# Patient Record
Sex: Female | Born: 1965
Health system: Southern US, Community
[De-identification: ages and names within clinical notes are randomized; demographics above are authoritative.]

## PROBLEM LIST (undated history)

## (undated) DIAGNOSIS — G971 Other reaction to spinal and lumbar puncture: Secondary | ICD-10-CM

## (undated) DIAGNOSIS — K8010 Calculus of gallbladder with chronic cholecystitis without obstruction: Secondary | ICD-10-CM

## (undated) DIAGNOSIS — R519 Headache, unspecified: Secondary | ICD-10-CM

## (undated) DIAGNOSIS — N186 End stage renal disease: Secondary | ICD-10-CM

## (undated) DIAGNOSIS — Z992 Dependence on renal dialysis: Secondary | ICD-10-CM

## (undated) DIAGNOSIS — Z9289 Personal history of other medical treatment: Secondary | ICD-10-CM

## (undated) DIAGNOSIS — I10 Essential (primary) hypertension: Secondary | ICD-10-CM

## (undated) DIAGNOSIS — J302 Other seasonal allergic rhinitis: Secondary | ICD-10-CM

## (undated) DIAGNOSIS — R51 Headache: Secondary | ICD-10-CM

## (undated) DIAGNOSIS — D649 Anemia, unspecified: Secondary | ICD-10-CM

## (undated) DIAGNOSIS — E119 Type 2 diabetes mellitus without complications: Secondary | ICD-10-CM

## (undated) DIAGNOSIS — M1990 Unspecified osteoarthritis, unspecified site: Secondary | ICD-10-CM

## (undated) DIAGNOSIS — N189 Chronic kidney disease, unspecified: Secondary | ICD-10-CM

## (undated) DIAGNOSIS — J9690 Respiratory failure, unspecified, unspecified whether with hypoxia or hypercapnia: Secondary | ICD-10-CM

## (undated) DIAGNOSIS — I509 Heart failure, unspecified: Secondary | ICD-10-CM

## (undated) HISTORY — DX: Respiratory failure, unspecified, unspecified whether with hypoxia or hypercapnia: J96.90

## (undated) HISTORY — PX: JOINT REPLACEMENT: SHX530

## (undated) HISTORY — PX: CATARACT EXTRACTION W/ INTRAOCULAR LENS IMPLANT: SHX1309

## (undated) HISTORY — DX: Chronic kidney disease, unspecified: N18.9

---

## 1995-03-29 HISTORY — PX: TOTAL HIP ARTHROPLASTY: SHX124

## 2001-06-22 ENCOUNTER — Emergency Department (HOSPITAL_COMMUNITY): Admission: EM | Admit: 2001-06-22 | Discharge: 2001-06-22 | Payer: Self-pay | Admitting: *Deleted

## 2002-02-07 ENCOUNTER — Emergency Department (HOSPITAL_COMMUNITY): Admission: EM | Admit: 2002-02-07 | Discharge: 2002-02-07 | Payer: Self-pay | Admitting: Emergency Medicine

## 2002-04-14 ENCOUNTER — Emergency Department (HOSPITAL_COMMUNITY): Admission: EM | Admit: 2002-04-14 | Discharge: 2002-04-14 | Payer: Self-pay | Admitting: Internal Medicine

## 2004-01-28 ENCOUNTER — Ambulatory Visit: Payer: Self-pay | Admitting: Family Medicine

## 2004-03-25 ENCOUNTER — Ambulatory Visit: Payer: Self-pay | Admitting: Family Medicine

## 2004-04-18 ENCOUNTER — Ambulatory Visit: Admission: RE | Admit: 2004-04-18 | Discharge: 2004-04-18 | Payer: Self-pay | Admitting: Family Medicine

## 2004-04-18 ENCOUNTER — Ambulatory Visit: Payer: Self-pay | Admitting: Pulmonary Disease

## 2004-04-26 ENCOUNTER — Ambulatory Visit: Payer: Self-pay | Admitting: Family Medicine

## 2005-02-16 ENCOUNTER — Ambulatory Visit: Payer: Self-pay | Admitting: Family Medicine

## 2005-03-09 ENCOUNTER — Ambulatory Visit (HOSPITAL_COMMUNITY): Admission: RE | Admit: 2005-03-09 | Discharge: 2005-03-09 | Payer: Self-pay | Admitting: Family Medicine

## 2005-03-09 IMAGING — US US BREAST*R*
1 series · 4 of 4 positions shown · non-contrast
Comparison: none

BILATERAL DIAGNOSTIC MAMMOGRAM AND RIGHT BREAST ULTRASOUND:
CLINICAL DATA: Palpable abnormality, right breast.

[Series 1: unknown · 0.06mm/px · 4 of 4 slices shown]
[im 1/4]
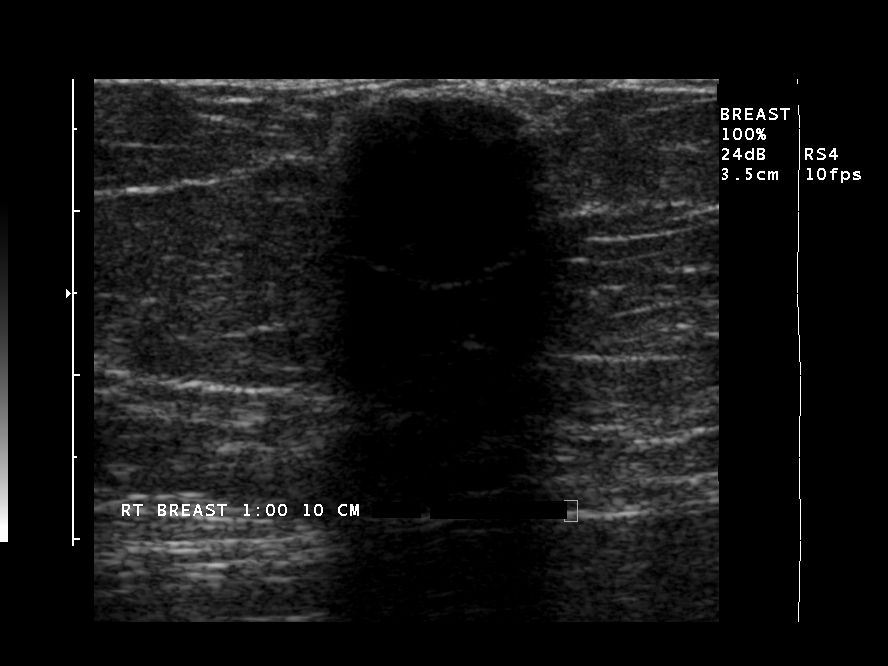
[im 2/4]
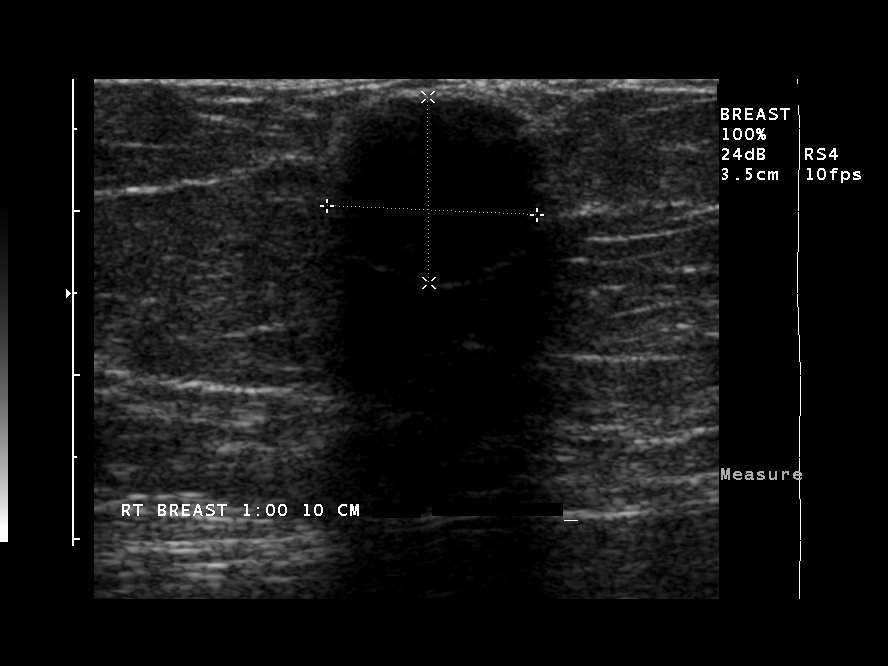
[im 3/4]
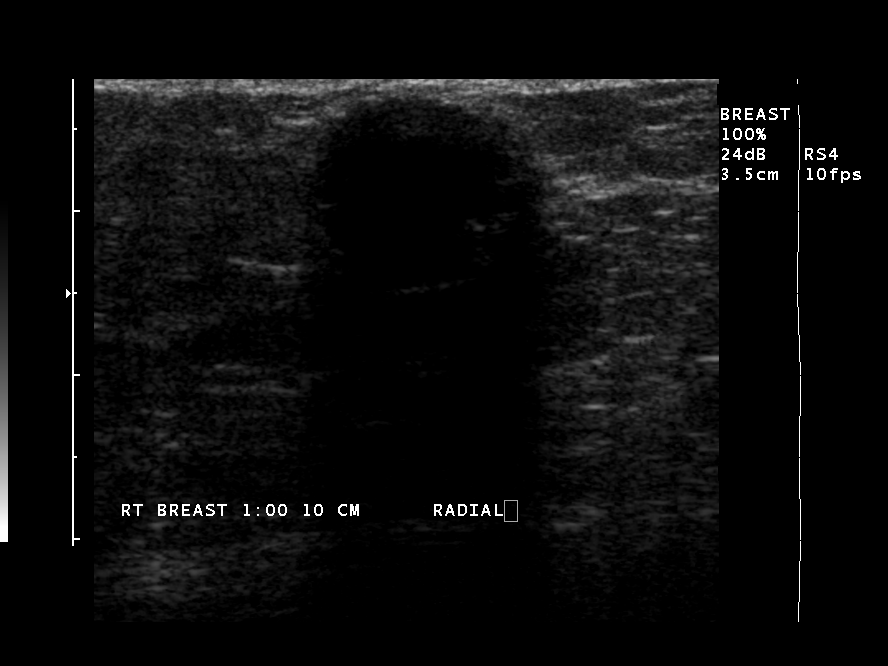
[im 4/4]
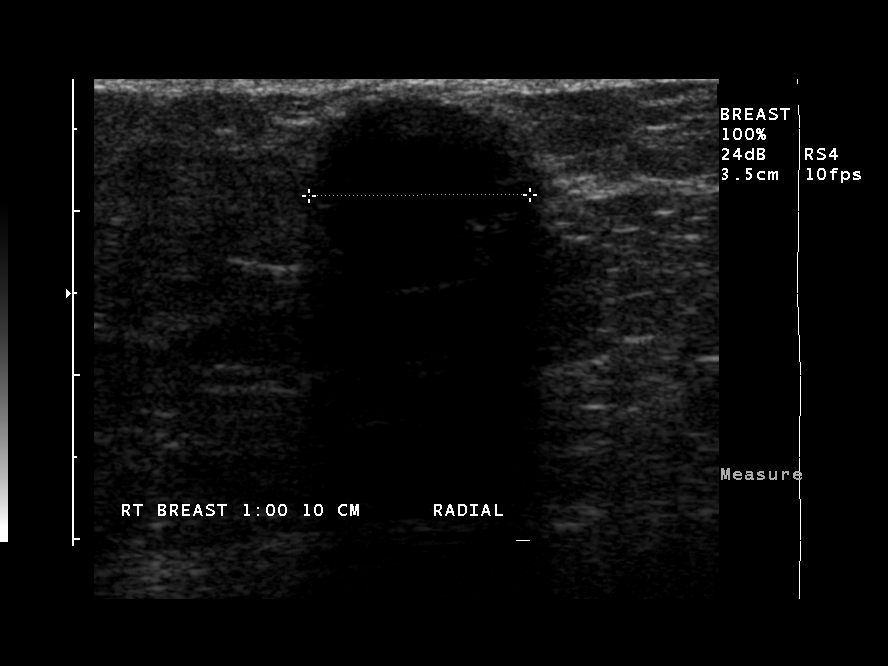

[4 of 4 positions shown; findings below may reference images not displayed]

There are scattered fibroglandular densities.  There is a calcified oil cyst in the upper inner 
quadrant of the right breast corresponding with the patient's palpable mass.  There is no 
suspicious mass or malignant type microcalcifications.

On physical exam, I palpate a discrete mass in the right breast at 1 o'clock approximately 10 cm 
from the nipple.  The patient states this is where she has had previous trauma and is unchanged for
several years.  Sonographically, calcified oil cyst is noted measuring 13 mm.
IMPRESSION: Calcified oil cyst, right breast.  No evidence of malignancy in either breast.  Screening mammogram
can be deferred until the age of 42 if the clinical exam remains benign/stable.

ASSESSMENT: Benign - BI-RADS 2

Routine screening mammogram at age 42.

## 2005-06-03 ENCOUNTER — Ambulatory Visit: Payer: Self-pay | Admitting: Family Medicine

## 2005-08-19 ENCOUNTER — Emergency Department (HOSPITAL_COMMUNITY): Admission: EM | Admit: 2005-08-19 | Discharge: 2005-08-20 | Payer: Self-pay | Admitting: Emergency Medicine

## 2007-03-29 ENCOUNTER — Encounter: Payer: Self-pay | Admitting: Family Medicine

## 2010-04-17 ENCOUNTER — Encounter: Payer: Self-pay | Admitting: Family Medicine

## 2010-04-27 NOTE — Letter (Signed)
Summary: Historic Patient File  Historic Patient File   Imported By: Eliezer Mccoy 10/26/2009 13:50:08  _____________________________________________________________________  External Attachment:    Type:   Image     Comment:   External Document

## 2011-03-29 HISTORY — PX: AMPUTATION TOE: SHX6595

## 2015-03-29 HISTORY — PX: PERITONEAL CATHETER INSERTION: SHX2223

## 2015-04-10 DIAGNOSIS — R600 Localized edema: Secondary | ICD-10-CM | POA: Insufficient documentation

## 2015-04-10 DIAGNOSIS — E11311 Type 2 diabetes mellitus with unspecified diabetic retinopathy with macular edema: Secondary | ICD-10-CM | POA: Insufficient documentation

## 2015-04-10 DIAGNOSIS — IMO0002 Reserved for concepts with insufficient information to code with codable children: Secondary | ICD-10-CM | POA: Insufficient documentation

## 2015-04-10 DIAGNOSIS — R202 Paresthesia of skin: Secondary | ICD-10-CM | POA: Insufficient documentation

## 2015-04-10 DIAGNOSIS — E1165 Type 2 diabetes mellitus with hyperglycemia: Secondary | ICD-10-CM | POA: Insufficient documentation

## 2015-04-10 DIAGNOSIS — R296 Repeated falls: Secondary | ICD-10-CM | POA: Insufficient documentation

## 2016-01-23 DIAGNOSIS — E1122 Type 2 diabetes mellitus with diabetic chronic kidney disease: Secondary | ICD-10-CM | POA: Insufficient documentation

## 2016-01-23 DIAGNOSIS — E11319 Type 2 diabetes mellitus with unspecified diabetic retinopathy without macular edema: Secondary | ICD-10-CM | POA: Insufficient documentation

## 2016-01-23 DIAGNOSIS — I10 Essential (primary) hypertension: Secondary | ICD-10-CM | POA: Insufficient documentation

## 2016-01-23 DIAGNOSIS — N250 Renal osteodystrophy: Secondary | ICD-10-CM | POA: Insufficient documentation

## 2016-01-23 DIAGNOSIS — I161 Hypertensive emergency: Secondary | ICD-10-CM | POA: Insufficient documentation

## 2016-03-28 DIAGNOSIS — N186 End stage renal disease: Secondary | ICD-10-CM | POA: Diagnosis not present

## 2016-03-28 DIAGNOSIS — E559 Vitamin D deficiency, unspecified: Secondary | ICD-10-CM | POA: Diagnosis not present

## 2016-03-28 DIAGNOSIS — D631 Anemia in chronic kidney disease: Secondary | ICD-10-CM | POA: Diagnosis not present

## 2016-03-28 DIAGNOSIS — Z992 Dependence on renal dialysis: Secondary | ICD-10-CM | POA: Diagnosis not present

## 2016-03-28 DIAGNOSIS — N2581 Secondary hyperparathyroidism of renal origin: Secondary | ICD-10-CM | POA: Diagnosis not present

## 2016-03-29 DIAGNOSIS — D631 Anemia in chronic kidney disease: Secondary | ICD-10-CM | POA: Diagnosis not present

## 2016-03-29 DIAGNOSIS — Z992 Dependence on renal dialysis: Secondary | ICD-10-CM | POA: Diagnosis not present

## 2016-03-29 DIAGNOSIS — N186 End stage renal disease: Secondary | ICD-10-CM | POA: Diagnosis not present

## 2016-03-29 DIAGNOSIS — N2581 Secondary hyperparathyroidism of renal origin: Secondary | ICD-10-CM | POA: Diagnosis not present

## 2016-03-29 DIAGNOSIS — E559 Vitamin D deficiency, unspecified: Secondary | ICD-10-CM | POA: Diagnosis not present

## 2016-03-30 DIAGNOSIS — N186 End stage renal disease: Secondary | ICD-10-CM | POA: Diagnosis not present

## 2016-03-30 DIAGNOSIS — E559 Vitamin D deficiency, unspecified: Secondary | ICD-10-CM | POA: Diagnosis not present

## 2016-03-30 DIAGNOSIS — N2581 Secondary hyperparathyroidism of renal origin: Secondary | ICD-10-CM | POA: Diagnosis not present

## 2016-03-30 DIAGNOSIS — D631 Anemia in chronic kidney disease: Secondary | ICD-10-CM | POA: Diagnosis not present

## 2016-03-30 DIAGNOSIS — Z992 Dependence on renal dialysis: Secondary | ICD-10-CM | POA: Diagnosis not present

## 2016-03-31 DIAGNOSIS — E559 Vitamin D deficiency, unspecified: Secondary | ICD-10-CM | POA: Diagnosis not present

## 2016-03-31 DIAGNOSIS — D631 Anemia in chronic kidney disease: Secondary | ICD-10-CM | POA: Diagnosis not present

## 2016-03-31 DIAGNOSIS — N186 End stage renal disease: Secondary | ICD-10-CM | POA: Diagnosis not present

## 2016-03-31 DIAGNOSIS — N2581 Secondary hyperparathyroidism of renal origin: Secondary | ICD-10-CM | POA: Diagnosis not present

## 2016-03-31 DIAGNOSIS — Z992 Dependence on renal dialysis: Secondary | ICD-10-CM | POA: Diagnosis not present

## 2016-04-01 DIAGNOSIS — N186 End stage renal disease: Secondary | ICD-10-CM | POA: Diagnosis not present

## 2016-04-01 DIAGNOSIS — N2581 Secondary hyperparathyroidism of renal origin: Secondary | ICD-10-CM | POA: Diagnosis not present

## 2016-04-01 DIAGNOSIS — D631 Anemia in chronic kidney disease: Secondary | ICD-10-CM | POA: Diagnosis not present

## 2016-04-01 DIAGNOSIS — E559 Vitamin D deficiency, unspecified: Secondary | ICD-10-CM | POA: Diagnosis not present

## 2016-04-01 DIAGNOSIS — Z992 Dependence on renal dialysis: Secondary | ICD-10-CM | POA: Diagnosis not present

## 2016-04-02 DIAGNOSIS — N186 End stage renal disease: Secondary | ICD-10-CM | POA: Diagnosis not present

## 2016-04-02 DIAGNOSIS — D631 Anemia in chronic kidney disease: Secondary | ICD-10-CM | POA: Diagnosis not present

## 2016-04-02 DIAGNOSIS — Z992 Dependence on renal dialysis: Secondary | ICD-10-CM | POA: Diagnosis not present

## 2016-04-02 DIAGNOSIS — E559 Vitamin D deficiency, unspecified: Secondary | ICD-10-CM | POA: Diagnosis not present

## 2016-04-02 DIAGNOSIS — N2581 Secondary hyperparathyroidism of renal origin: Secondary | ICD-10-CM | POA: Diagnosis not present

## 2016-04-03 DIAGNOSIS — E559 Vitamin D deficiency, unspecified: Secondary | ICD-10-CM | POA: Diagnosis not present

## 2016-04-03 DIAGNOSIS — Z992 Dependence on renal dialysis: Secondary | ICD-10-CM | POA: Diagnosis not present

## 2016-04-03 DIAGNOSIS — D631 Anemia in chronic kidney disease: Secondary | ICD-10-CM | POA: Diagnosis not present

## 2016-04-03 DIAGNOSIS — N186 End stage renal disease: Secondary | ICD-10-CM | POA: Diagnosis not present

## 2016-04-03 DIAGNOSIS — N2581 Secondary hyperparathyroidism of renal origin: Secondary | ICD-10-CM | POA: Diagnosis not present

## 2016-04-04 DIAGNOSIS — D631 Anemia in chronic kidney disease: Secondary | ICD-10-CM | POA: Diagnosis not present

## 2016-04-04 DIAGNOSIS — Z992 Dependence on renal dialysis: Secondary | ICD-10-CM | POA: Diagnosis not present

## 2016-04-04 DIAGNOSIS — N186 End stage renal disease: Secondary | ICD-10-CM | POA: Diagnosis not present

## 2016-04-04 DIAGNOSIS — N2581 Secondary hyperparathyroidism of renal origin: Secondary | ICD-10-CM | POA: Diagnosis not present

## 2016-04-04 DIAGNOSIS — E559 Vitamin D deficiency, unspecified: Secondary | ICD-10-CM | POA: Diagnosis not present

## 2016-04-05 DIAGNOSIS — D631 Anemia in chronic kidney disease: Secondary | ICD-10-CM | POA: Diagnosis not present

## 2016-04-05 DIAGNOSIS — E559 Vitamin D deficiency, unspecified: Secondary | ICD-10-CM | POA: Diagnosis not present

## 2016-04-05 DIAGNOSIS — N2581 Secondary hyperparathyroidism of renal origin: Secondary | ICD-10-CM | POA: Diagnosis not present

## 2016-04-05 DIAGNOSIS — Z992 Dependence on renal dialysis: Secondary | ICD-10-CM | POA: Diagnosis not present

## 2016-04-05 DIAGNOSIS — N186 End stage renal disease: Secondary | ICD-10-CM | POA: Diagnosis not present

## 2016-04-06 DIAGNOSIS — E559 Vitamin D deficiency, unspecified: Secondary | ICD-10-CM | POA: Diagnosis not present

## 2016-04-06 DIAGNOSIS — N186 End stage renal disease: Secondary | ICD-10-CM | POA: Diagnosis not present

## 2016-04-06 DIAGNOSIS — D631 Anemia in chronic kidney disease: Secondary | ICD-10-CM | POA: Diagnosis not present

## 2016-04-06 DIAGNOSIS — Z992 Dependence on renal dialysis: Secondary | ICD-10-CM | POA: Diagnosis not present

## 2016-04-06 DIAGNOSIS — N2581 Secondary hyperparathyroidism of renal origin: Secondary | ICD-10-CM | POA: Diagnosis not present

## 2016-04-07 DIAGNOSIS — N2581 Secondary hyperparathyroidism of renal origin: Secondary | ICD-10-CM | POA: Diagnosis not present

## 2016-04-07 DIAGNOSIS — Z992 Dependence on renal dialysis: Secondary | ICD-10-CM | POA: Diagnosis not present

## 2016-04-07 DIAGNOSIS — D631 Anemia in chronic kidney disease: Secondary | ICD-10-CM | POA: Diagnosis not present

## 2016-04-07 DIAGNOSIS — N186 End stage renal disease: Secondary | ICD-10-CM | POA: Diagnosis not present

## 2016-04-07 DIAGNOSIS — E559 Vitamin D deficiency, unspecified: Secondary | ICD-10-CM | POA: Diagnosis not present

## 2016-04-08 DIAGNOSIS — D631 Anemia in chronic kidney disease: Secondary | ICD-10-CM | POA: Diagnosis not present

## 2016-04-08 DIAGNOSIS — N186 End stage renal disease: Secondary | ICD-10-CM | POA: Diagnosis not present

## 2016-04-08 DIAGNOSIS — Z992 Dependence on renal dialysis: Secondary | ICD-10-CM | POA: Diagnosis not present

## 2016-04-08 DIAGNOSIS — E559 Vitamin D deficiency, unspecified: Secondary | ICD-10-CM | POA: Diagnosis not present

## 2016-04-08 DIAGNOSIS — N2581 Secondary hyperparathyroidism of renal origin: Secondary | ICD-10-CM | POA: Diagnosis not present

## 2016-04-09 DIAGNOSIS — N186 End stage renal disease: Secondary | ICD-10-CM | POA: Diagnosis not present

## 2016-04-09 DIAGNOSIS — D631 Anemia in chronic kidney disease: Secondary | ICD-10-CM | POA: Diagnosis not present

## 2016-04-09 DIAGNOSIS — Z992 Dependence on renal dialysis: Secondary | ICD-10-CM | POA: Diagnosis not present

## 2016-04-09 DIAGNOSIS — N2581 Secondary hyperparathyroidism of renal origin: Secondary | ICD-10-CM | POA: Diagnosis not present

## 2016-04-09 DIAGNOSIS — E559 Vitamin D deficiency, unspecified: Secondary | ICD-10-CM | POA: Diagnosis not present

## 2016-04-10 DIAGNOSIS — D631 Anemia in chronic kidney disease: Secondary | ICD-10-CM | POA: Diagnosis not present

## 2016-04-10 DIAGNOSIS — Z992 Dependence on renal dialysis: Secondary | ICD-10-CM | POA: Diagnosis not present

## 2016-04-10 DIAGNOSIS — E559 Vitamin D deficiency, unspecified: Secondary | ICD-10-CM | POA: Diagnosis not present

## 2016-04-10 DIAGNOSIS — N2581 Secondary hyperparathyroidism of renal origin: Secondary | ICD-10-CM | POA: Diagnosis not present

## 2016-04-10 DIAGNOSIS — N186 End stage renal disease: Secondary | ICD-10-CM | POA: Diagnosis not present

## 2016-04-11 DIAGNOSIS — Z992 Dependence on renal dialysis: Secondary | ICD-10-CM | POA: Diagnosis not present

## 2016-04-11 DIAGNOSIS — D631 Anemia in chronic kidney disease: Secondary | ICD-10-CM | POA: Diagnosis not present

## 2016-04-11 DIAGNOSIS — N186 End stage renal disease: Secondary | ICD-10-CM | POA: Diagnosis not present

## 2016-04-11 DIAGNOSIS — N2581 Secondary hyperparathyroidism of renal origin: Secondary | ICD-10-CM | POA: Diagnosis not present

## 2016-04-11 DIAGNOSIS — E559 Vitamin D deficiency, unspecified: Secondary | ICD-10-CM | POA: Diagnosis not present

## 2016-04-12 DIAGNOSIS — N186 End stage renal disease: Secondary | ICD-10-CM | POA: Diagnosis not present

## 2016-04-12 DIAGNOSIS — E559 Vitamin D deficiency, unspecified: Secondary | ICD-10-CM | POA: Diagnosis not present

## 2016-04-12 DIAGNOSIS — D631 Anemia in chronic kidney disease: Secondary | ICD-10-CM | POA: Diagnosis not present

## 2016-04-12 DIAGNOSIS — N2581 Secondary hyperparathyroidism of renal origin: Secondary | ICD-10-CM | POA: Diagnosis not present

## 2016-04-12 DIAGNOSIS — Z992 Dependence on renal dialysis: Secondary | ICD-10-CM | POA: Diagnosis not present

## 2016-04-13 DIAGNOSIS — Z992 Dependence on renal dialysis: Secondary | ICD-10-CM | POA: Diagnosis not present

## 2016-04-13 DIAGNOSIS — N2581 Secondary hyperparathyroidism of renal origin: Secondary | ICD-10-CM | POA: Diagnosis not present

## 2016-04-13 DIAGNOSIS — N186 End stage renal disease: Secondary | ICD-10-CM | POA: Diagnosis not present

## 2016-04-13 DIAGNOSIS — D631 Anemia in chronic kidney disease: Secondary | ICD-10-CM | POA: Diagnosis not present

## 2016-04-13 DIAGNOSIS — E559 Vitamin D deficiency, unspecified: Secondary | ICD-10-CM | POA: Diagnosis not present

## 2016-04-14 DIAGNOSIS — D631 Anemia in chronic kidney disease: Secondary | ICD-10-CM | POA: Diagnosis not present

## 2016-04-14 DIAGNOSIS — N186 End stage renal disease: Secondary | ICD-10-CM | POA: Diagnosis not present

## 2016-04-14 DIAGNOSIS — Z992 Dependence on renal dialysis: Secondary | ICD-10-CM | POA: Diagnosis not present

## 2016-04-14 DIAGNOSIS — E559 Vitamin D deficiency, unspecified: Secondary | ICD-10-CM | POA: Diagnosis not present

## 2016-04-14 DIAGNOSIS — N2581 Secondary hyperparathyroidism of renal origin: Secondary | ICD-10-CM | POA: Diagnosis not present

## 2016-04-15 DIAGNOSIS — N186 End stage renal disease: Secondary | ICD-10-CM | POA: Diagnosis not present

## 2016-04-15 DIAGNOSIS — N2581 Secondary hyperparathyroidism of renal origin: Secondary | ICD-10-CM | POA: Diagnosis not present

## 2016-04-15 DIAGNOSIS — D631 Anemia in chronic kidney disease: Secondary | ICD-10-CM | POA: Diagnosis not present

## 2016-04-15 DIAGNOSIS — E559 Vitamin D deficiency, unspecified: Secondary | ICD-10-CM | POA: Diagnosis not present

## 2016-04-15 DIAGNOSIS — Z992 Dependence on renal dialysis: Secondary | ICD-10-CM | POA: Diagnosis not present

## 2016-04-16 DIAGNOSIS — D631 Anemia in chronic kidney disease: Secondary | ICD-10-CM | POA: Diagnosis not present

## 2016-04-16 DIAGNOSIS — E559 Vitamin D deficiency, unspecified: Secondary | ICD-10-CM | POA: Diagnosis not present

## 2016-04-16 DIAGNOSIS — N186 End stage renal disease: Secondary | ICD-10-CM | POA: Diagnosis not present

## 2016-04-16 DIAGNOSIS — Z992 Dependence on renal dialysis: Secondary | ICD-10-CM | POA: Diagnosis not present

## 2016-04-16 DIAGNOSIS — N2581 Secondary hyperparathyroidism of renal origin: Secondary | ICD-10-CM | POA: Diagnosis not present

## 2016-04-17 DIAGNOSIS — N186 End stage renal disease: Secondary | ICD-10-CM | POA: Diagnosis not present

## 2016-04-17 DIAGNOSIS — E559 Vitamin D deficiency, unspecified: Secondary | ICD-10-CM | POA: Diagnosis not present

## 2016-04-17 DIAGNOSIS — D631 Anemia in chronic kidney disease: Secondary | ICD-10-CM | POA: Diagnosis not present

## 2016-04-17 DIAGNOSIS — N2581 Secondary hyperparathyroidism of renal origin: Secondary | ICD-10-CM | POA: Diagnosis not present

## 2016-04-17 DIAGNOSIS — Z992 Dependence on renal dialysis: Secondary | ICD-10-CM | POA: Diagnosis not present

## 2016-04-18 DIAGNOSIS — E559 Vitamin D deficiency, unspecified: Secondary | ICD-10-CM | POA: Diagnosis not present

## 2016-04-18 DIAGNOSIS — E785 Hyperlipidemia, unspecified: Secondary | ICD-10-CM | POA: Diagnosis not present

## 2016-04-18 DIAGNOSIS — E119 Type 2 diabetes mellitus without complications: Secondary | ICD-10-CM | POA: Diagnosis not present

## 2016-04-18 DIAGNOSIS — N2581 Secondary hyperparathyroidism of renal origin: Secondary | ICD-10-CM | POA: Diagnosis not present

## 2016-04-18 DIAGNOSIS — Z992 Dependence on renal dialysis: Secondary | ICD-10-CM | POA: Diagnosis not present

## 2016-04-18 DIAGNOSIS — Z794 Long term (current) use of insulin: Secondary | ICD-10-CM | POA: Diagnosis not present

## 2016-04-18 DIAGNOSIS — N186 End stage renal disease: Secondary | ICD-10-CM | POA: Diagnosis not present

## 2016-04-18 DIAGNOSIS — D631 Anemia in chronic kidney disease: Secondary | ICD-10-CM | POA: Diagnosis not present

## 2016-04-18 DIAGNOSIS — Z79899 Other long term (current) drug therapy: Secondary | ICD-10-CM | POA: Diagnosis not present

## 2016-04-19 DIAGNOSIS — Z992 Dependence on renal dialysis: Secondary | ICD-10-CM | POA: Diagnosis not present

## 2016-04-19 DIAGNOSIS — N186 End stage renal disease: Secondary | ICD-10-CM | POA: Diagnosis not present

## 2016-04-19 DIAGNOSIS — E559 Vitamin D deficiency, unspecified: Secondary | ICD-10-CM | POA: Diagnosis not present

## 2016-04-19 DIAGNOSIS — D631 Anemia in chronic kidney disease: Secondary | ICD-10-CM | POA: Diagnosis not present

## 2016-04-19 DIAGNOSIS — N2581 Secondary hyperparathyroidism of renal origin: Secondary | ICD-10-CM | POA: Diagnosis not present

## 2016-04-20 DIAGNOSIS — E559 Vitamin D deficiency, unspecified: Secondary | ICD-10-CM | POA: Diagnosis not present

## 2016-04-20 DIAGNOSIS — N2581 Secondary hyperparathyroidism of renal origin: Secondary | ICD-10-CM | POA: Diagnosis not present

## 2016-04-20 DIAGNOSIS — N186 End stage renal disease: Secondary | ICD-10-CM | POA: Diagnosis not present

## 2016-04-20 DIAGNOSIS — Z992 Dependence on renal dialysis: Secondary | ICD-10-CM | POA: Diagnosis not present

## 2016-04-20 DIAGNOSIS — D631 Anemia in chronic kidney disease: Secondary | ICD-10-CM | POA: Diagnosis not present

## 2016-04-21 DIAGNOSIS — E559 Vitamin D deficiency, unspecified: Secondary | ICD-10-CM | POA: Diagnosis not present

## 2016-04-21 DIAGNOSIS — Z992 Dependence on renal dialysis: Secondary | ICD-10-CM | POA: Diagnosis not present

## 2016-04-21 DIAGNOSIS — D631 Anemia in chronic kidney disease: Secondary | ICD-10-CM | POA: Diagnosis not present

## 2016-04-21 DIAGNOSIS — N2581 Secondary hyperparathyroidism of renal origin: Secondary | ICD-10-CM | POA: Diagnosis not present

## 2016-04-21 DIAGNOSIS — N186 End stage renal disease: Secondary | ICD-10-CM | POA: Diagnosis not present

## 2016-04-22 DIAGNOSIS — N186 End stage renal disease: Secondary | ICD-10-CM | POA: Diagnosis not present

## 2016-04-22 DIAGNOSIS — N2581 Secondary hyperparathyroidism of renal origin: Secondary | ICD-10-CM | POA: Diagnosis not present

## 2016-04-22 DIAGNOSIS — D631 Anemia in chronic kidney disease: Secondary | ICD-10-CM | POA: Diagnosis not present

## 2016-04-22 DIAGNOSIS — E559 Vitamin D deficiency, unspecified: Secondary | ICD-10-CM | POA: Diagnosis not present

## 2016-04-22 DIAGNOSIS — Z992 Dependence on renal dialysis: Secondary | ICD-10-CM | POA: Diagnosis not present

## 2016-04-23 DIAGNOSIS — Z992 Dependence on renal dialysis: Secondary | ICD-10-CM | POA: Diagnosis not present

## 2016-04-23 DIAGNOSIS — D631 Anemia in chronic kidney disease: Secondary | ICD-10-CM | POA: Diagnosis not present

## 2016-04-23 DIAGNOSIS — E559 Vitamin D deficiency, unspecified: Secondary | ICD-10-CM | POA: Diagnosis not present

## 2016-04-23 DIAGNOSIS — N2581 Secondary hyperparathyroidism of renal origin: Secondary | ICD-10-CM | POA: Diagnosis not present

## 2016-04-23 DIAGNOSIS — N186 End stage renal disease: Secondary | ICD-10-CM | POA: Diagnosis not present

## 2016-04-24 DIAGNOSIS — N186 End stage renal disease: Secondary | ICD-10-CM | POA: Diagnosis not present

## 2016-04-24 DIAGNOSIS — E559 Vitamin D deficiency, unspecified: Secondary | ICD-10-CM | POA: Diagnosis not present

## 2016-04-24 DIAGNOSIS — D631 Anemia in chronic kidney disease: Secondary | ICD-10-CM | POA: Diagnosis not present

## 2016-04-24 DIAGNOSIS — Z992 Dependence on renal dialysis: Secondary | ICD-10-CM | POA: Diagnosis not present

## 2016-04-24 DIAGNOSIS — N2581 Secondary hyperparathyroidism of renal origin: Secondary | ICD-10-CM | POA: Diagnosis not present

## 2016-04-25 DIAGNOSIS — E559 Vitamin D deficiency, unspecified: Secondary | ICD-10-CM | POA: Diagnosis not present

## 2016-04-25 DIAGNOSIS — N2581 Secondary hyperparathyroidism of renal origin: Secondary | ICD-10-CM | POA: Diagnosis not present

## 2016-04-25 DIAGNOSIS — D631 Anemia in chronic kidney disease: Secondary | ICD-10-CM | POA: Diagnosis not present

## 2016-04-25 DIAGNOSIS — N186 End stage renal disease: Secondary | ICD-10-CM | POA: Diagnosis not present

## 2016-04-25 DIAGNOSIS — Z992 Dependence on renal dialysis: Secondary | ICD-10-CM | POA: Diagnosis not present

## 2016-04-26 DIAGNOSIS — E559 Vitamin D deficiency, unspecified: Secondary | ICD-10-CM | POA: Diagnosis not present

## 2016-04-26 DIAGNOSIS — N2581 Secondary hyperparathyroidism of renal origin: Secondary | ICD-10-CM | POA: Diagnosis not present

## 2016-04-26 DIAGNOSIS — D631 Anemia in chronic kidney disease: Secondary | ICD-10-CM | POA: Diagnosis not present

## 2016-04-26 DIAGNOSIS — N186 End stage renal disease: Secondary | ICD-10-CM | POA: Diagnosis not present

## 2016-04-26 DIAGNOSIS — Z992 Dependence on renal dialysis: Secondary | ICD-10-CM | POA: Diagnosis not present

## 2016-04-27 DIAGNOSIS — E559 Vitamin D deficiency, unspecified: Secondary | ICD-10-CM | POA: Diagnosis not present

## 2016-04-27 DIAGNOSIS — N186 End stage renal disease: Secondary | ICD-10-CM | POA: Diagnosis not present

## 2016-04-27 DIAGNOSIS — D631 Anemia in chronic kidney disease: Secondary | ICD-10-CM | POA: Diagnosis not present

## 2016-04-27 DIAGNOSIS — Z992 Dependence on renal dialysis: Secondary | ICD-10-CM | POA: Diagnosis not present

## 2016-04-27 DIAGNOSIS — N2581 Secondary hyperparathyroidism of renal origin: Secondary | ICD-10-CM | POA: Diagnosis not present

## 2016-04-28 DIAGNOSIS — D509 Iron deficiency anemia, unspecified: Secondary | ICD-10-CM | POA: Diagnosis not present

## 2016-04-28 DIAGNOSIS — Z992 Dependence on renal dialysis: Secondary | ICD-10-CM | POA: Diagnosis not present

## 2016-04-28 DIAGNOSIS — N186 End stage renal disease: Secondary | ICD-10-CM | POA: Diagnosis not present

## 2016-04-28 DIAGNOSIS — D631 Anemia in chronic kidney disease: Secondary | ICD-10-CM | POA: Diagnosis not present

## 2016-04-29 DIAGNOSIS — D631 Anemia in chronic kidney disease: Secondary | ICD-10-CM | POA: Diagnosis not present

## 2016-04-29 DIAGNOSIS — N186 End stage renal disease: Secondary | ICD-10-CM | POA: Diagnosis not present

## 2016-04-29 DIAGNOSIS — D509 Iron deficiency anemia, unspecified: Secondary | ICD-10-CM | POA: Diagnosis not present

## 2016-04-29 DIAGNOSIS — Z992 Dependence on renal dialysis: Secondary | ICD-10-CM | POA: Diagnosis not present

## 2016-04-30 DIAGNOSIS — N186 End stage renal disease: Secondary | ICD-10-CM | POA: Diagnosis not present

## 2016-04-30 DIAGNOSIS — Z992 Dependence on renal dialysis: Secondary | ICD-10-CM | POA: Diagnosis not present

## 2016-04-30 DIAGNOSIS — D509 Iron deficiency anemia, unspecified: Secondary | ICD-10-CM | POA: Diagnosis not present

## 2016-04-30 DIAGNOSIS — D631 Anemia in chronic kidney disease: Secondary | ICD-10-CM | POA: Diagnosis not present

## 2016-05-01 DIAGNOSIS — D631 Anemia in chronic kidney disease: Secondary | ICD-10-CM | POA: Diagnosis not present

## 2016-05-01 DIAGNOSIS — D509 Iron deficiency anemia, unspecified: Secondary | ICD-10-CM | POA: Diagnosis not present

## 2016-05-01 DIAGNOSIS — Z992 Dependence on renal dialysis: Secondary | ICD-10-CM | POA: Diagnosis not present

## 2016-05-01 DIAGNOSIS — N186 End stage renal disease: Secondary | ICD-10-CM | POA: Diagnosis not present

## 2016-05-02 DIAGNOSIS — N186 End stage renal disease: Secondary | ICD-10-CM | POA: Diagnosis not present

## 2016-05-02 DIAGNOSIS — Z992 Dependence on renal dialysis: Secondary | ICD-10-CM | POA: Diagnosis not present

## 2016-05-02 DIAGNOSIS — D631 Anemia in chronic kidney disease: Secondary | ICD-10-CM | POA: Diagnosis not present

## 2016-05-02 DIAGNOSIS — D509 Iron deficiency anemia, unspecified: Secondary | ICD-10-CM | POA: Diagnosis not present

## 2016-05-03 DIAGNOSIS — D631 Anemia in chronic kidney disease: Secondary | ICD-10-CM | POA: Diagnosis not present

## 2016-05-03 DIAGNOSIS — N186 End stage renal disease: Secondary | ICD-10-CM | POA: Diagnosis not present

## 2016-05-03 DIAGNOSIS — D509 Iron deficiency anemia, unspecified: Secondary | ICD-10-CM | POA: Diagnosis not present

## 2016-05-03 DIAGNOSIS — Z992 Dependence on renal dialysis: Secondary | ICD-10-CM | POA: Diagnosis not present

## 2016-05-04 DIAGNOSIS — D509 Iron deficiency anemia, unspecified: Secondary | ICD-10-CM | POA: Diagnosis not present

## 2016-05-04 DIAGNOSIS — Z992 Dependence on renal dialysis: Secondary | ICD-10-CM | POA: Diagnosis not present

## 2016-05-04 DIAGNOSIS — D631 Anemia in chronic kidney disease: Secondary | ICD-10-CM | POA: Diagnosis not present

## 2016-05-04 DIAGNOSIS — N186 End stage renal disease: Secondary | ICD-10-CM | POA: Diagnosis not present

## 2016-05-05 DIAGNOSIS — D509 Iron deficiency anemia, unspecified: Secondary | ICD-10-CM | POA: Diagnosis not present

## 2016-05-05 DIAGNOSIS — D631 Anemia in chronic kidney disease: Secondary | ICD-10-CM | POA: Diagnosis not present

## 2016-05-05 DIAGNOSIS — N186 End stage renal disease: Secondary | ICD-10-CM | POA: Diagnosis not present

## 2016-05-05 DIAGNOSIS — Z992 Dependence on renal dialysis: Secondary | ICD-10-CM | POA: Diagnosis not present

## 2016-05-06 DIAGNOSIS — N186 End stage renal disease: Secondary | ICD-10-CM | POA: Diagnosis not present

## 2016-05-06 DIAGNOSIS — Z992 Dependence on renal dialysis: Secondary | ICD-10-CM | POA: Diagnosis not present

## 2016-05-06 DIAGNOSIS — D631 Anemia in chronic kidney disease: Secondary | ICD-10-CM | POA: Diagnosis not present

## 2016-05-06 DIAGNOSIS — D509 Iron deficiency anemia, unspecified: Secondary | ICD-10-CM | POA: Diagnosis not present

## 2016-05-07 DIAGNOSIS — D631 Anemia in chronic kidney disease: Secondary | ICD-10-CM | POA: Diagnosis not present

## 2016-05-07 DIAGNOSIS — D509 Iron deficiency anemia, unspecified: Secondary | ICD-10-CM | POA: Diagnosis not present

## 2016-05-07 DIAGNOSIS — Z992 Dependence on renal dialysis: Secondary | ICD-10-CM | POA: Diagnosis not present

## 2016-05-07 DIAGNOSIS — N186 End stage renal disease: Secondary | ICD-10-CM | POA: Diagnosis not present

## 2016-05-08 DIAGNOSIS — Z992 Dependence on renal dialysis: Secondary | ICD-10-CM | POA: Diagnosis not present

## 2016-05-08 DIAGNOSIS — D509 Iron deficiency anemia, unspecified: Secondary | ICD-10-CM | POA: Diagnosis not present

## 2016-05-08 DIAGNOSIS — N186 End stage renal disease: Secondary | ICD-10-CM | POA: Diagnosis not present

## 2016-05-08 DIAGNOSIS — D631 Anemia in chronic kidney disease: Secondary | ICD-10-CM | POA: Diagnosis not present

## 2016-05-09 DIAGNOSIS — D509 Iron deficiency anemia, unspecified: Secondary | ICD-10-CM | POA: Diagnosis not present

## 2016-05-09 DIAGNOSIS — D631 Anemia in chronic kidney disease: Secondary | ICD-10-CM | POA: Diagnosis not present

## 2016-05-09 DIAGNOSIS — Z992 Dependence on renal dialysis: Secondary | ICD-10-CM | POA: Diagnosis not present

## 2016-05-09 DIAGNOSIS — N186 End stage renal disease: Secondary | ICD-10-CM | POA: Diagnosis not present

## 2016-05-10 DIAGNOSIS — D509 Iron deficiency anemia, unspecified: Secondary | ICD-10-CM | POA: Diagnosis not present

## 2016-05-10 DIAGNOSIS — D631 Anemia in chronic kidney disease: Secondary | ICD-10-CM | POA: Diagnosis not present

## 2016-05-10 DIAGNOSIS — N186 End stage renal disease: Secondary | ICD-10-CM | POA: Diagnosis not present

## 2016-05-10 DIAGNOSIS — Z992 Dependence on renal dialysis: Secondary | ICD-10-CM | POA: Diagnosis not present

## 2016-05-11 DIAGNOSIS — N186 End stage renal disease: Secondary | ICD-10-CM | POA: Diagnosis not present

## 2016-05-11 DIAGNOSIS — D631 Anemia in chronic kidney disease: Secondary | ICD-10-CM | POA: Diagnosis not present

## 2016-05-11 DIAGNOSIS — Z992 Dependence on renal dialysis: Secondary | ICD-10-CM | POA: Diagnosis not present

## 2016-05-11 DIAGNOSIS — D509 Iron deficiency anemia, unspecified: Secondary | ICD-10-CM | POA: Diagnosis not present

## 2016-05-12 DIAGNOSIS — Z992 Dependence on renal dialysis: Secondary | ICD-10-CM | POA: Diagnosis not present

## 2016-05-12 DIAGNOSIS — N186 End stage renal disease: Secondary | ICD-10-CM | POA: Diagnosis not present

## 2016-05-12 DIAGNOSIS — D509 Iron deficiency anemia, unspecified: Secondary | ICD-10-CM | POA: Diagnosis not present

## 2016-05-12 DIAGNOSIS — D631 Anemia in chronic kidney disease: Secondary | ICD-10-CM | POA: Diagnosis not present

## 2016-05-13 DIAGNOSIS — D631 Anemia in chronic kidney disease: Secondary | ICD-10-CM | POA: Diagnosis not present

## 2016-05-13 DIAGNOSIS — D509 Iron deficiency anemia, unspecified: Secondary | ICD-10-CM | POA: Diagnosis not present

## 2016-05-13 DIAGNOSIS — Z992 Dependence on renal dialysis: Secondary | ICD-10-CM | POA: Diagnosis not present

## 2016-05-13 DIAGNOSIS — N186 End stage renal disease: Secondary | ICD-10-CM | POA: Diagnosis not present

## 2016-05-14 DIAGNOSIS — D631 Anemia in chronic kidney disease: Secondary | ICD-10-CM | POA: Diagnosis not present

## 2016-05-14 DIAGNOSIS — Z992 Dependence on renal dialysis: Secondary | ICD-10-CM | POA: Diagnosis not present

## 2016-05-14 DIAGNOSIS — D509 Iron deficiency anemia, unspecified: Secondary | ICD-10-CM | POA: Diagnosis not present

## 2016-05-14 DIAGNOSIS — N186 End stage renal disease: Secondary | ICD-10-CM | POA: Diagnosis not present

## 2016-05-15 DIAGNOSIS — N186 End stage renal disease: Secondary | ICD-10-CM | POA: Diagnosis not present

## 2016-05-15 DIAGNOSIS — D631 Anemia in chronic kidney disease: Secondary | ICD-10-CM | POA: Diagnosis not present

## 2016-05-15 DIAGNOSIS — Z992 Dependence on renal dialysis: Secondary | ICD-10-CM | POA: Diagnosis not present

## 2016-05-15 DIAGNOSIS — D509 Iron deficiency anemia, unspecified: Secondary | ICD-10-CM | POA: Diagnosis not present

## 2016-05-16 DIAGNOSIS — D509 Iron deficiency anemia, unspecified: Secondary | ICD-10-CM | POA: Diagnosis not present

## 2016-05-16 DIAGNOSIS — D631 Anemia in chronic kidney disease: Secondary | ICD-10-CM | POA: Diagnosis not present

## 2016-05-16 DIAGNOSIS — N186 End stage renal disease: Secondary | ICD-10-CM | POA: Diagnosis not present

## 2016-05-16 DIAGNOSIS — Z992 Dependence on renal dialysis: Secondary | ICD-10-CM | POA: Diagnosis not present

## 2016-05-17 DIAGNOSIS — D509 Iron deficiency anemia, unspecified: Secondary | ICD-10-CM | POA: Diagnosis not present

## 2016-05-17 DIAGNOSIS — N186 End stage renal disease: Secondary | ICD-10-CM | POA: Diagnosis not present

## 2016-05-17 DIAGNOSIS — Z992 Dependence on renal dialysis: Secondary | ICD-10-CM | POA: Diagnosis not present

## 2016-05-17 DIAGNOSIS — D631 Anemia in chronic kidney disease: Secondary | ICD-10-CM | POA: Diagnosis not present

## 2016-05-18 DIAGNOSIS — N186 End stage renal disease: Secondary | ICD-10-CM | POA: Diagnosis not present

## 2016-05-18 DIAGNOSIS — D509 Iron deficiency anemia, unspecified: Secondary | ICD-10-CM | POA: Diagnosis not present

## 2016-05-18 DIAGNOSIS — Z992 Dependence on renal dialysis: Secondary | ICD-10-CM | POA: Diagnosis not present

## 2016-05-18 DIAGNOSIS — D631 Anemia in chronic kidney disease: Secondary | ICD-10-CM | POA: Diagnosis not present

## 2016-05-19 DIAGNOSIS — N186 End stage renal disease: Secondary | ICD-10-CM | POA: Diagnosis not present

## 2016-05-19 DIAGNOSIS — Z992 Dependence on renal dialysis: Secondary | ICD-10-CM | POA: Diagnosis not present

## 2016-05-19 DIAGNOSIS — D509 Iron deficiency anemia, unspecified: Secondary | ICD-10-CM | POA: Diagnosis not present

## 2016-05-19 DIAGNOSIS — D631 Anemia in chronic kidney disease: Secondary | ICD-10-CM | POA: Diagnosis not present

## 2016-05-20 DIAGNOSIS — Z992 Dependence on renal dialysis: Secondary | ICD-10-CM | POA: Diagnosis not present

## 2016-05-20 DIAGNOSIS — D631 Anemia in chronic kidney disease: Secondary | ICD-10-CM | POA: Diagnosis not present

## 2016-05-20 DIAGNOSIS — D509 Iron deficiency anemia, unspecified: Secondary | ICD-10-CM | POA: Diagnosis not present

## 2016-05-20 DIAGNOSIS — N186 End stage renal disease: Secondary | ICD-10-CM | POA: Diagnosis not present

## 2016-05-21 DIAGNOSIS — D631 Anemia in chronic kidney disease: Secondary | ICD-10-CM | POA: Diagnosis not present

## 2016-05-21 DIAGNOSIS — D509 Iron deficiency anemia, unspecified: Secondary | ICD-10-CM | POA: Diagnosis not present

## 2016-05-21 DIAGNOSIS — Z992 Dependence on renal dialysis: Secondary | ICD-10-CM | POA: Diagnosis not present

## 2016-05-21 DIAGNOSIS — N186 End stage renal disease: Secondary | ICD-10-CM | POA: Diagnosis not present

## 2016-05-22 DIAGNOSIS — Z992 Dependence on renal dialysis: Secondary | ICD-10-CM | POA: Diagnosis not present

## 2016-05-22 DIAGNOSIS — N186 End stage renal disease: Secondary | ICD-10-CM | POA: Diagnosis not present

## 2016-05-22 DIAGNOSIS — D509 Iron deficiency anemia, unspecified: Secondary | ICD-10-CM | POA: Diagnosis not present

## 2016-05-22 DIAGNOSIS — D631 Anemia in chronic kidney disease: Secondary | ICD-10-CM | POA: Diagnosis not present

## 2016-05-23 DIAGNOSIS — D509 Iron deficiency anemia, unspecified: Secondary | ICD-10-CM | POA: Diagnosis not present

## 2016-05-23 DIAGNOSIS — D631 Anemia in chronic kidney disease: Secondary | ICD-10-CM | POA: Diagnosis not present

## 2016-05-23 DIAGNOSIS — N186 End stage renal disease: Secondary | ICD-10-CM | POA: Diagnosis not present

## 2016-05-23 DIAGNOSIS — Z992 Dependence on renal dialysis: Secondary | ICD-10-CM | POA: Diagnosis not present

## 2016-05-24 DIAGNOSIS — N186 End stage renal disease: Secondary | ICD-10-CM | POA: Diagnosis not present

## 2016-05-24 DIAGNOSIS — D631 Anemia in chronic kidney disease: Secondary | ICD-10-CM | POA: Diagnosis not present

## 2016-05-24 DIAGNOSIS — Z992 Dependence on renal dialysis: Secondary | ICD-10-CM | POA: Diagnosis not present

## 2016-05-24 DIAGNOSIS — D509 Iron deficiency anemia, unspecified: Secondary | ICD-10-CM | POA: Diagnosis not present

## 2016-05-25 DIAGNOSIS — D631 Anemia in chronic kidney disease: Secondary | ICD-10-CM | POA: Diagnosis not present

## 2016-05-25 DIAGNOSIS — N186 End stage renal disease: Secondary | ICD-10-CM | POA: Diagnosis not present

## 2016-05-25 DIAGNOSIS — D509 Iron deficiency anemia, unspecified: Secondary | ICD-10-CM | POA: Diagnosis not present

## 2016-05-25 DIAGNOSIS — Z992 Dependence on renal dialysis: Secondary | ICD-10-CM | POA: Diagnosis not present

## 2016-05-26 DIAGNOSIS — Z992 Dependence on renal dialysis: Secondary | ICD-10-CM | POA: Diagnosis not present

## 2016-05-26 DIAGNOSIS — N186 End stage renal disease: Secondary | ICD-10-CM | POA: Diagnosis not present

## 2016-05-27 DIAGNOSIS — N186 End stage renal disease: Secondary | ICD-10-CM | POA: Diagnosis not present

## 2016-05-27 DIAGNOSIS — Z992 Dependence on renal dialysis: Secondary | ICD-10-CM | POA: Diagnosis not present

## 2016-05-28 DIAGNOSIS — Z992 Dependence on renal dialysis: Secondary | ICD-10-CM | POA: Diagnosis not present

## 2016-05-28 DIAGNOSIS — N186 End stage renal disease: Secondary | ICD-10-CM | POA: Diagnosis not present

## 2016-05-29 DIAGNOSIS — Z992 Dependence on renal dialysis: Secondary | ICD-10-CM | POA: Diagnosis not present

## 2016-05-29 DIAGNOSIS — N186 End stage renal disease: Secondary | ICD-10-CM | POA: Diagnosis not present

## 2016-05-30 DIAGNOSIS — N186 End stage renal disease: Secondary | ICD-10-CM | POA: Diagnosis not present

## 2016-05-30 DIAGNOSIS — Z992 Dependence on renal dialysis: Secondary | ICD-10-CM | POA: Diagnosis not present

## 2016-05-31 DIAGNOSIS — N2581 Secondary hyperparathyroidism of renal origin: Secondary | ICD-10-CM | POA: Diagnosis not present

## 2016-05-31 DIAGNOSIS — Z992 Dependence on renal dialysis: Secondary | ICD-10-CM | POA: Diagnosis not present

## 2016-05-31 DIAGNOSIS — D631 Anemia in chronic kidney disease: Secondary | ICD-10-CM | POA: Diagnosis not present

## 2016-05-31 DIAGNOSIS — N186 End stage renal disease: Secondary | ICD-10-CM | POA: Diagnosis not present

## 2016-05-31 DIAGNOSIS — D509 Iron deficiency anemia, unspecified: Secondary | ICD-10-CM | POA: Diagnosis not present

## 2016-06-01 DIAGNOSIS — D631 Anemia in chronic kidney disease: Secondary | ICD-10-CM | POA: Diagnosis not present

## 2016-06-01 DIAGNOSIS — N186 End stage renal disease: Secondary | ICD-10-CM | POA: Diagnosis not present

## 2016-06-01 DIAGNOSIS — N2581 Secondary hyperparathyroidism of renal origin: Secondary | ICD-10-CM | POA: Diagnosis not present

## 2016-06-01 DIAGNOSIS — D509 Iron deficiency anemia, unspecified: Secondary | ICD-10-CM | POA: Diagnosis not present

## 2016-06-01 DIAGNOSIS — Z992 Dependence on renal dialysis: Secondary | ICD-10-CM | POA: Diagnosis not present

## 2016-06-02 DIAGNOSIS — N186 End stage renal disease: Secondary | ICD-10-CM | POA: Diagnosis not present

## 2016-06-02 DIAGNOSIS — D631 Anemia in chronic kidney disease: Secondary | ICD-10-CM | POA: Diagnosis not present

## 2016-06-02 DIAGNOSIS — N2581 Secondary hyperparathyroidism of renal origin: Secondary | ICD-10-CM | POA: Diagnosis not present

## 2016-06-02 DIAGNOSIS — D509 Iron deficiency anemia, unspecified: Secondary | ICD-10-CM | POA: Diagnosis not present

## 2016-06-02 DIAGNOSIS — Z992 Dependence on renal dialysis: Secondary | ICD-10-CM | POA: Diagnosis not present

## 2016-06-03 DIAGNOSIS — N2581 Secondary hyperparathyroidism of renal origin: Secondary | ICD-10-CM | POA: Diagnosis not present

## 2016-06-03 DIAGNOSIS — Z992 Dependence on renal dialysis: Secondary | ICD-10-CM | POA: Diagnosis not present

## 2016-06-03 DIAGNOSIS — D509 Iron deficiency anemia, unspecified: Secondary | ICD-10-CM | POA: Diagnosis not present

## 2016-06-03 DIAGNOSIS — D631 Anemia in chronic kidney disease: Secondary | ICD-10-CM | POA: Diagnosis not present

## 2016-06-03 DIAGNOSIS — N186 End stage renal disease: Secondary | ICD-10-CM | POA: Diagnosis not present

## 2016-06-04 DIAGNOSIS — N2581 Secondary hyperparathyroidism of renal origin: Secondary | ICD-10-CM | POA: Diagnosis not present

## 2016-06-04 DIAGNOSIS — N186 End stage renal disease: Secondary | ICD-10-CM | POA: Diagnosis not present

## 2016-06-04 DIAGNOSIS — D509 Iron deficiency anemia, unspecified: Secondary | ICD-10-CM | POA: Diagnosis not present

## 2016-06-04 DIAGNOSIS — Z992 Dependence on renal dialysis: Secondary | ICD-10-CM | POA: Diagnosis not present

## 2016-06-04 DIAGNOSIS — D631 Anemia in chronic kidney disease: Secondary | ICD-10-CM | POA: Diagnosis not present

## 2016-06-05 DIAGNOSIS — N2581 Secondary hyperparathyroidism of renal origin: Secondary | ICD-10-CM | POA: Diagnosis not present

## 2016-06-05 DIAGNOSIS — N186 End stage renal disease: Secondary | ICD-10-CM | POA: Diagnosis not present

## 2016-06-05 DIAGNOSIS — Z992 Dependence on renal dialysis: Secondary | ICD-10-CM | POA: Diagnosis not present

## 2016-06-05 DIAGNOSIS — D509 Iron deficiency anemia, unspecified: Secondary | ICD-10-CM | POA: Diagnosis not present

## 2016-06-05 DIAGNOSIS — D631 Anemia in chronic kidney disease: Secondary | ICD-10-CM | POA: Diagnosis not present

## 2016-06-06 DIAGNOSIS — N2581 Secondary hyperparathyroidism of renal origin: Secondary | ICD-10-CM | POA: Diagnosis not present

## 2016-06-06 DIAGNOSIS — Z992 Dependence on renal dialysis: Secondary | ICD-10-CM | POA: Diagnosis not present

## 2016-06-06 DIAGNOSIS — N186 End stage renal disease: Secondary | ICD-10-CM | POA: Diagnosis not present

## 2016-06-06 DIAGNOSIS — D631 Anemia in chronic kidney disease: Secondary | ICD-10-CM | POA: Diagnosis not present

## 2016-06-06 DIAGNOSIS — D509 Iron deficiency anemia, unspecified: Secondary | ICD-10-CM | POA: Diagnosis not present

## 2016-06-07 DIAGNOSIS — Z992 Dependence on renal dialysis: Secondary | ICD-10-CM | POA: Diagnosis not present

## 2016-06-07 DIAGNOSIS — N2581 Secondary hyperparathyroidism of renal origin: Secondary | ICD-10-CM | POA: Diagnosis not present

## 2016-06-07 DIAGNOSIS — D509 Iron deficiency anemia, unspecified: Secondary | ICD-10-CM | POA: Diagnosis not present

## 2016-06-07 DIAGNOSIS — D631 Anemia in chronic kidney disease: Secondary | ICD-10-CM | POA: Diagnosis not present

## 2016-06-07 DIAGNOSIS — N186 End stage renal disease: Secondary | ICD-10-CM | POA: Diagnosis not present

## 2016-06-08 DIAGNOSIS — N2581 Secondary hyperparathyroidism of renal origin: Secondary | ICD-10-CM | POA: Diagnosis not present

## 2016-06-08 DIAGNOSIS — D509 Iron deficiency anemia, unspecified: Secondary | ICD-10-CM | POA: Diagnosis not present

## 2016-06-08 DIAGNOSIS — N186 End stage renal disease: Secondary | ICD-10-CM | POA: Diagnosis not present

## 2016-06-08 DIAGNOSIS — D631 Anemia in chronic kidney disease: Secondary | ICD-10-CM | POA: Diagnosis not present

## 2016-06-08 DIAGNOSIS — Z992 Dependence on renal dialysis: Secondary | ICD-10-CM | POA: Diagnosis not present

## 2016-06-09 DIAGNOSIS — D631 Anemia in chronic kidney disease: Secondary | ICD-10-CM | POA: Diagnosis not present

## 2016-06-09 DIAGNOSIS — N186 End stage renal disease: Secondary | ICD-10-CM | POA: Diagnosis not present

## 2016-06-09 DIAGNOSIS — Z992 Dependence on renal dialysis: Secondary | ICD-10-CM | POA: Diagnosis not present

## 2016-06-09 DIAGNOSIS — D509 Iron deficiency anemia, unspecified: Secondary | ICD-10-CM | POA: Diagnosis not present

## 2016-06-09 DIAGNOSIS — N2581 Secondary hyperparathyroidism of renal origin: Secondary | ICD-10-CM | POA: Diagnosis not present

## 2016-06-10 DIAGNOSIS — D509 Iron deficiency anemia, unspecified: Secondary | ICD-10-CM | POA: Diagnosis not present

## 2016-06-10 DIAGNOSIS — N186 End stage renal disease: Secondary | ICD-10-CM | POA: Diagnosis not present

## 2016-06-10 DIAGNOSIS — Z992 Dependence on renal dialysis: Secondary | ICD-10-CM | POA: Diagnosis not present

## 2016-06-10 DIAGNOSIS — D631 Anemia in chronic kidney disease: Secondary | ICD-10-CM | POA: Diagnosis not present

## 2016-06-10 DIAGNOSIS — N2581 Secondary hyperparathyroidism of renal origin: Secondary | ICD-10-CM | POA: Diagnosis not present

## 2016-06-11 DIAGNOSIS — D631 Anemia in chronic kidney disease: Secondary | ICD-10-CM | POA: Diagnosis not present

## 2016-06-11 DIAGNOSIS — N2581 Secondary hyperparathyroidism of renal origin: Secondary | ICD-10-CM | POA: Diagnosis not present

## 2016-06-11 DIAGNOSIS — N186 End stage renal disease: Secondary | ICD-10-CM | POA: Diagnosis not present

## 2016-06-11 DIAGNOSIS — Z992 Dependence on renal dialysis: Secondary | ICD-10-CM | POA: Diagnosis not present

## 2016-06-11 DIAGNOSIS — D509 Iron deficiency anemia, unspecified: Secondary | ICD-10-CM | POA: Diagnosis not present

## 2016-06-12 DIAGNOSIS — N2581 Secondary hyperparathyroidism of renal origin: Secondary | ICD-10-CM | POA: Diagnosis not present

## 2016-06-12 DIAGNOSIS — N186 End stage renal disease: Secondary | ICD-10-CM | POA: Diagnosis not present

## 2016-06-12 DIAGNOSIS — D631 Anemia in chronic kidney disease: Secondary | ICD-10-CM | POA: Diagnosis not present

## 2016-06-12 DIAGNOSIS — Z992 Dependence on renal dialysis: Secondary | ICD-10-CM | POA: Diagnosis not present

## 2016-06-12 DIAGNOSIS — D509 Iron deficiency anemia, unspecified: Secondary | ICD-10-CM | POA: Diagnosis not present

## 2016-06-13 DIAGNOSIS — D631 Anemia in chronic kidney disease: Secondary | ICD-10-CM | POA: Diagnosis not present

## 2016-06-13 DIAGNOSIS — N186 End stage renal disease: Secondary | ICD-10-CM | POA: Diagnosis not present

## 2016-06-13 DIAGNOSIS — Z992 Dependence on renal dialysis: Secondary | ICD-10-CM | POA: Diagnosis not present

## 2016-06-13 DIAGNOSIS — D509 Iron deficiency anemia, unspecified: Secondary | ICD-10-CM | POA: Diagnosis not present

## 2016-06-13 DIAGNOSIS — N2581 Secondary hyperparathyroidism of renal origin: Secondary | ICD-10-CM | POA: Diagnosis not present

## 2016-06-14 DIAGNOSIS — N2581 Secondary hyperparathyroidism of renal origin: Secondary | ICD-10-CM | POA: Diagnosis not present

## 2016-06-14 DIAGNOSIS — N186 End stage renal disease: Secondary | ICD-10-CM | POA: Diagnosis not present

## 2016-06-14 DIAGNOSIS — Z992 Dependence on renal dialysis: Secondary | ICD-10-CM | POA: Diagnosis not present

## 2016-06-14 DIAGNOSIS — D509 Iron deficiency anemia, unspecified: Secondary | ICD-10-CM | POA: Diagnosis not present

## 2016-06-14 DIAGNOSIS — D631 Anemia in chronic kidney disease: Secondary | ICD-10-CM | POA: Diagnosis not present

## 2016-06-15 DIAGNOSIS — Z992 Dependence on renal dialysis: Secondary | ICD-10-CM | POA: Diagnosis not present

## 2016-06-15 DIAGNOSIS — N2581 Secondary hyperparathyroidism of renal origin: Secondary | ICD-10-CM | POA: Diagnosis not present

## 2016-06-15 DIAGNOSIS — N186 End stage renal disease: Secondary | ICD-10-CM | POA: Diagnosis not present

## 2016-06-15 DIAGNOSIS — D509 Iron deficiency anemia, unspecified: Secondary | ICD-10-CM | POA: Diagnosis not present

## 2016-06-15 DIAGNOSIS — D631 Anemia in chronic kidney disease: Secondary | ICD-10-CM | POA: Diagnosis not present

## 2016-06-16 DIAGNOSIS — D631 Anemia in chronic kidney disease: Secondary | ICD-10-CM | POA: Diagnosis not present

## 2016-06-16 DIAGNOSIS — N186 End stage renal disease: Secondary | ICD-10-CM | POA: Diagnosis not present

## 2016-06-16 DIAGNOSIS — D509 Iron deficiency anemia, unspecified: Secondary | ICD-10-CM | POA: Diagnosis not present

## 2016-06-16 DIAGNOSIS — Z992 Dependence on renal dialysis: Secondary | ICD-10-CM | POA: Diagnosis not present

## 2016-06-16 DIAGNOSIS — N2581 Secondary hyperparathyroidism of renal origin: Secondary | ICD-10-CM | POA: Diagnosis not present

## 2016-06-17 DIAGNOSIS — N186 End stage renal disease: Secondary | ICD-10-CM | POA: Diagnosis not present

## 2016-06-17 DIAGNOSIS — Z992 Dependence on renal dialysis: Secondary | ICD-10-CM | POA: Diagnosis not present

## 2016-06-17 DIAGNOSIS — D631 Anemia in chronic kidney disease: Secondary | ICD-10-CM | POA: Diagnosis not present

## 2016-06-17 DIAGNOSIS — D509 Iron deficiency anemia, unspecified: Secondary | ICD-10-CM | POA: Diagnosis not present

## 2016-06-17 DIAGNOSIS — N2581 Secondary hyperparathyroidism of renal origin: Secondary | ICD-10-CM | POA: Diagnosis not present

## 2016-06-18 DIAGNOSIS — Z992 Dependence on renal dialysis: Secondary | ICD-10-CM | POA: Diagnosis not present

## 2016-06-18 DIAGNOSIS — D509 Iron deficiency anemia, unspecified: Secondary | ICD-10-CM | POA: Diagnosis not present

## 2016-06-18 DIAGNOSIS — D631 Anemia in chronic kidney disease: Secondary | ICD-10-CM | POA: Diagnosis not present

## 2016-06-18 DIAGNOSIS — N2581 Secondary hyperparathyroidism of renal origin: Secondary | ICD-10-CM | POA: Diagnosis not present

## 2016-06-18 DIAGNOSIS — N186 End stage renal disease: Secondary | ICD-10-CM | POA: Diagnosis not present

## 2016-06-19 DIAGNOSIS — D509 Iron deficiency anemia, unspecified: Secondary | ICD-10-CM | POA: Diagnosis not present

## 2016-06-19 DIAGNOSIS — N186 End stage renal disease: Secondary | ICD-10-CM | POA: Diagnosis not present

## 2016-06-19 DIAGNOSIS — Z992 Dependence on renal dialysis: Secondary | ICD-10-CM | POA: Diagnosis not present

## 2016-06-19 DIAGNOSIS — N2581 Secondary hyperparathyroidism of renal origin: Secondary | ICD-10-CM | POA: Diagnosis not present

## 2016-06-19 DIAGNOSIS — D631 Anemia in chronic kidney disease: Secondary | ICD-10-CM | POA: Diagnosis not present

## 2016-06-20 DIAGNOSIS — Z992 Dependence on renal dialysis: Secondary | ICD-10-CM | POA: Diagnosis not present

## 2016-06-20 DIAGNOSIS — D509 Iron deficiency anemia, unspecified: Secondary | ICD-10-CM | POA: Diagnosis not present

## 2016-06-20 DIAGNOSIS — N186 End stage renal disease: Secondary | ICD-10-CM | POA: Diagnosis not present

## 2016-06-20 DIAGNOSIS — N2581 Secondary hyperparathyroidism of renal origin: Secondary | ICD-10-CM | POA: Diagnosis not present

## 2016-06-20 DIAGNOSIS — D631 Anemia in chronic kidney disease: Secondary | ICD-10-CM | POA: Diagnosis not present

## 2016-06-21 DIAGNOSIS — D509 Iron deficiency anemia, unspecified: Secondary | ICD-10-CM | POA: Diagnosis not present

## 2016-06-21 DIAGNOSIS — D631 Anemia in chronic kidney disease: Secondary | ICD-10-CM | POA: Diagnosis not present

## 2016-06-21 DIAGNOSIS — N2581 Secondary hyperparathyroidism of renal origin: Secondary | ICD-10-CM | POA: Diagnosis not present

## 2016-06-21 DIAGNOSIS — Z992 Dependence on renal dialysis: Secondary | ICD-10-CM | POA: Diagnosis not present

## 2016-06-21 DIAGNOSIS — N186 End stage renal disease: Secondary | ICD-10-CM | POA: Diagnosis not present

## 2016-06-22 DIAGNOSIS — D631 Anemia in chronic kidney disease: Secondary | ICD-10-CM | POA: Diagnosis not present

## 2016-06-22 DIAGNOSIS — N2581 Secondary hyperparathyroidism of renal origin: Secondary | ICD-10-CM | POA: Diagnosis not present

## 2016-06-22 DIAGNOSIS — N186 End stage renal disease: Secondary | ICD-10-CM | POA: Diagnosis not present

## 2016-06-22 DIAGNOSIS — D509 Iron deficiency anemia, unspecified: Secondary | ICD-10-CM | POA: Diagnosis not present

## 2016-06-22 DIAGNOSIS — Z992 Dependence on renal dialysis: Secondary | ICD-10-CM | POA: Diagnosis not present

## 2016-06-23 DIAGNOSIS — N186 End stage renal disease: Secondary | ICD-10-CM | POA: Diagnosis not present

## 2016-06-23 DIAGNOSIS — N2581 Secondary hyperparathyroidism of renal origin: Secondary | ICD-10-CM | POA: Diagnosis not present

## 2016-06-23 DIAGNOSIS — D509 Iron deficiency anemia, unspecified: Secondary | ICD-10-CM | POA: Diagnosis not present

## 2016-06-23 DIAGNOSIS — Z992 Dependence on renal dialysis: Secondary | ICD-10-CM | POA: Diagnosis not present

## 2016-06-23 DIAGNOSIS — D631 Anemia in chronic kidney disease: Secondary | ICD-10-CM | POA: Diagnosis not present

## 2016-06-24 DIAGNOSIS — N186 End stage renal disease: Secondary | ICD-10-CM | POA: Diagnosis not present

## 2016-06-24 DIAGNOSIS — D631 Anemia in chronic kidney disease: Secondary | ICD-10-CM | POA: Diagnosis not present

## 2016-06-24 DIAGNOSIS — D509 Iron deficiency anemia, unspecified: Secondary | ICD-10-CM | POA: Diagnosis not present

## 2016-06-24 DIAGNOSIS — Z992 Dependence on renal dialysis: Secondary | ICD-10-CM | POA: Diagnosis not present

## 2016-06-24 DIAGNOSIS — N2581 Secondary hyperparathyroidism of renal origin: Secondary | ICD-10-CM | POA: Diagnosis not present

## 2016-06-25 DIAGNOSIS — D631 Anemia in chronic kidney disease: Secondary | ICD-10-CM | POA: Diagnosis not present

## 2016-06-25 DIAGNOSIS — Z992 Dependence on renal dialysis: Secondary | ICD-10-CM | POA: Diagnosis not present

## 2016-06-25 DIAGNOSIS — N186 End stage renal disease: Secondary | ICD-10-CM | POA: Diagnosis not present

## 2016-06-25 DIAGNOSIS — D509 Iron deficiency anemia, unspecified: Secondary | ICD-10-CM | POA: Diagnosis not present

## 2016-06-25 DIAGNOSIS — N2581 Secondary hyperparathyroidism of renal origin: Secondary | ICD-10-CM | POA: Diagnosis not present

## 2016-06-26 DIAGNOSIS — N186 End stage renal disease: Secondary | ICD-10-CM | POA: Diagnosis not present

## 2016-06-26 DIAGNOSIS — N2581 Secondary hyperparathyroidism of renal origin: Secondary | ICD-10-CM | POA: Diagnosis not present

## 2016-06-26 DIAGNOSIS — E559 Vitamin D deficiency, unspecified: Secondary | ICD-10-CM | POA: Diagnosis not present

## 2016-06-26 DIAGNOSIS — D631 Anemia in chronic kidney disease: Secondary | ICD-10-CM | POA: Diagnosis not present

## 2016-06-26 DIAGNOSIS — D509 Iron deficiency anemia, unspecified: Secondary | ICD-10-CM | POA: Diagnosis not present

## 2016-06-26 DIAGNOSIS — Z992 Dependence on renal dialysis: Secondary | ICD-10-CM | POA: Diagnosis not present

## 2016-06-27 DIAGNOSIS — E559 Vitamin D deficiency, unspecified: Secondary | ICD-10-CM | POA: Diagnosis not present

## 2016-06-27 DIAGNOSIS — D631 Anemia in chronic kidney disease: Secondary | ICD-10-CM | POA: Diagnosis not present

## 2016-06-27 DIAGNOSIS — N186 End stage renal disease: Secondary | ICD-10-CM | POA: Diagnosis not present

## 2016-06-27 DIAGNOSIS — N2581 Secondary hyperparathyroidism of renal origin: Secondary | ICD-10-CM | POA: Diagnosis not present

## 2016-06-27 DIAGNOSIS — D509 Iron deficiency anemia, unspecified: Secondary | ICD-10-CM | POA: Diagnosis not present

## 2016-06-27 DIAGNOSIS — Z992 Dependence on renal dialysis: Secondary | ICD-10-CM | POA: Diagnosis not present

## 2016-06-28 DIAGNOSIS — D631 Anemia in chronic kidney disease: Secondary | ICD-10-CM | POA: Diagnosis not present

## 2016-06-28 DIAGNOSIS — Z992 Dependence on renal dialysis: Secondary | ICD-10-CM | POA: Diagnosis not present

## 2016-06-28 DIAGNOSIS — Z794 Long term (current) use of insulin: Secondary | ICD-10-CM | POA: Diagnosis not present

## 2016-06-28 DIAGNOSIS — E559 Vitamin D deficiency, unspecified: Secondary | ICD-10-CM | POA: Diagnosis not present

## 2016-06-28 DIAGNOSIS — D509 Iron deficiency anemia, unspecified: Secondary | ICD-10-CM | POA: Diagnosis not present

## 2016-06-28 DIAGNOSIS — N186 End stage renal disease: Secondary | ICD-10-CM | POA: Diagnosis not present

## 2016-06-28 DIAGNOSIS — N2581 Secondary hyperparathyroidism of renal origin: Secondary | ICD-10-CM | POA: Diagnosis not present

## 2016-06-28 DIAGNOSIS — E119 Type 2 diabetes mellitus without complications: Secondary | ICD-10-CM | POA: Diagnosis not present

## 2016-06-29 DIAGNOSIS — D631 Anemia in chronic kidney disease: Secondary | ICD-10-CM | POA: Diagnosis not present

## 2016-06-29 DIAGNOSIS — E559 Vitamin D deficiency, unspecified: Secondary | ICD-10-CM | POA: Diagnosis not present

## 2016-06-29 DIAGNOSIS — N2581 Secondary hyperparathyroidism of renal origin: Secondary | ICD-10-CM | POA: Diagnosis not present

## 2016-06-29 DIAGNOSIS — D509 Iron deficiency anemia, unspecified: Secondary | ICD-10-CM | POA: Diagnosis not present

## 2016-06-29 DIAGNOSIS — Z992 Dependence on renal dialysis: Secondary | ICD-10-CM | POA: Diagnosis not present

## 2016-06-29 DIAGNOSIS — N186 End stage renal disease: Secondary | ICD-10-CM | POA: Diagnosis not present

## 2016-06-30 DIAGNOSIS — D509 Iron deficiency anemia, unspecified: Secondary | ICD-10-CM | POA: Diagnosis not present

## 2016-06-30 DIAGNOSIS — N2581 Secondary hyperparathyroidism of renal origin: Secondary | ICD-10-CM | POA: Diagnosis not present

## 2016-06-30 DIAGNOSIS — Z992 Dependence on renal dialysis: Secondary | ICD-10-CM | POA: Diagnosis not present

## 2016-06-30 DIAGNOSIS — E559 Vitamin D deficiency, unspecified: Secondary | ICD-10-CM | POA: Diagnosis not present

## 2016-06-30 DIAGNOSIS — N186 End stage renal disease: Secondary | ICD-10-CM | POA: Diagnosis not present

## 2016-06-30 DIAGNOSIS — D631 Anemia in chronic kidney disease: Secondary | ICD-10-CM | POA: Diagnosis not present

## 2016-07-01 DIAGNOSIS — D509 Iron deficiency anemia, unspecified: Secondary | ICD-10-CM | POA: Diagnosis not present

## 2016-07-01 DIAGNOSIS — Z992 Dependence on renal dialysis: Secondary | ICD-10-CM | POA: Diagnosis not present

## 2016-07-01 DIAGNOSIS — N2581 Secondary hyperparathyroidism of renal origin: Secondary | ICD-10-CM | POA: Diagnosis not present

## 2016-07-01 DIAGNOSIS — N186 End stage renal disease: Secondary | ICD-10-CM | POA: Diagnosis not present

## 2016-07-01 DIAGNOSIS — D631 Anemia in chronic kidney disease: Secondary | ICD-10-CM | POA: Diagnosis not present

## 2016-07-01 DIAGNOSIS — E559 Vitamin D deficiency, unspecified: Secondary | ICD-10-CM | POA: Diagnosis not present

## 2016-07-02 DIAGNOSIS — D631 Anemia in chronic kidney disease: Secondary | ICD-10-CM | POA: Diagnosis not present

## 2016-07-02 DIAGNOSIS — Z992 Dependence on renal dialysis: Secondary | ICD-10-CM | POA: Diagnosis not present

## 2016-07-02 DIAGNOSIS — E559 Vitamin D deficiency, unspecified: Secondary | ICD-10-CM | POA: Diagnosis not present

## 2016-07-02 DIAGNOSIS — N2581 Secondary hyperparathyroidism of renal origin: Secondary | ICD-10-CM | POA: Diagnosis not present

## 2016-07-02 DIAGNOSIS — N186 End stage renal disease: Secondary | ICD-10-CM | POA: Diagnosis not present

## 2016-07-02 DIAGNOSIS — D509 Iron deficiency anemia, unspecified: Secondary | ICD-10-CM | POA: Diagnosis not present

## 2016-07-03 DIAGNOSIS — E559 Vitamin D deficiency, unspecified: Secondary | ICD-10-CM | POA: Diagnosis not present

## 2016-07-03 DIAGNOSIS — D631 Anemia in chronic kidney disease: Secondary | ICD-10-CM | POA: Diagnosis not present

## 2016-07-03 DIAGNOSIS — D509 Iron deficiency anemia, unspecified: Secondary | ICD-10-CM | POA: Diagnosis not present

## 2016-07-03 DIAGNOSIS — N186 End stage renal disease: Secondary | ICD-10-CM | POA: Diagnosis not present

## 2016-07-03 DIAGNOSIS — N2581 Secondary hyperparathyroidism of renal origin: Secondary | ICD-10-CM | POA: Diagnosis not present

## 2016-07-03 DIAGNOSIS — Z992 Dependence on renal dialysis: Secondary | ICD-10-CM | POA: Diagnosis not present

## 2016-07-04 DIAGNOSIS — D509 Iron deficiency anemia, unspecified: Secondary | ICD-10-CM | POA: Diagnosis not present

## 2016-07-04 DIAGNOSIS — E559 Vitamin D deficiency, unspecified: Secondary | ICD-10-CM | POA: Diagnosis not present

## 2016-07-04 DIAGNOSIS — N186 End stage renal disease: Secondary | ICD-10-CM | POA: Diagnosis not present

## 2016-07-04 DIAGNOSIS — D631 Anemia in chronic kidney disease: Secondary | ICD-10-CM | POA: Diagnosis not present

## 2016-07-04 DIAGNOSIS — N2581 Secondary hyperparathyroidism of renal origin: Secondary | ICD-10-CM | POA: Diagnosis not present

## 2016-07-04 DIAGNOSIS — Z992 Dependence on renal dialysis: Secondary | ICD-10-CM | POA: Diagnosis not present

## 2016-07-05 DIAGNOSIS — E559 Vitamin D deficiency, unspecified: Secondary | ICD-10-CM | POA: Diagnosis not present

## 2016-07-05 DIAGNOSIS — Z992 Dependence on renal dialysis: Secondary | ICD-10-CM | POA: Diagnosis not present

## 2016-07-05 DIAGNOSIS — N186 End stage renal disease: Secondary | ICD-10-CM | POA: Diagnosis not present

## 2016-07-05 DIAGNOSIS — D509 Iron deficiency anemia, unspecified: Secondary | ICD-10-CM | POA: Diagnosis not present

## 2016-07-05 DIAGNOSIS — N2581 Secondary hyperparathyroidism of renal origin: Secondary | ICD-10-CM | POA: Diagnosis not present

## 2016-07-05 DIAGNOSIS — D631 Anemia in chronic kidney disease: Secondary | ICD-10-CM | POA: Diagnosis not present

## 2016-07-06 DIAGNOSIS — E559 Vitamin D deficiency, unspecified: Secondary | ICD-10-CM | POA: Diagnosis not present

## 2016-07-06 DIAGNOSIS — N186 End stage renal disease: Secondary | ICD-10-CM | POA: Diagnosis not present

## 2016-07-06 DIAGNOSIS — D631 Anemia in chronic kidney disease: Secondary | ICD-10-CM | POA: Diagnosis not present

## 2016-07-06 DIAGNOSIS — Z992 Dependence on renal dialysis: Secondary | ICD-10-CM | POA: Diagnosis not present

## 2016-07-06 DIAGNOSIS — N2581 Secondary hyperparathyroidism of renal origin: Secondary | ICD-10-CM | POA: Diagnosis not present

## 2016-07-06 DIAGNOSIS — D509 Iron deficiency anemia, unspecified: Secondary | ICD-10-CM | POA: Diagnosis not present

## 2016-07-07 DIAGNOSIS — D509 Iron deficiency anemia, unspecified: Secondary | ICD-10-CM | POA: Diagnosis not present

## 2016-07-07 DIAGNOSIS — N186 End stage renal disease: Secondary | ICD-10-CM | POA: Diagnosis not present

## 2016-07-07 DIAGNOSIS — D631 Anemia in chronic kidney disease: Secondary | ICD-10-CM | POA: Diagnosis not present

## 2016-07-07 DIAGNOSIS — Z992 Dependence on renal dialysis: Secondary | ICD-10-CM | POA: Diagnosis not present

## 2016-07-07 DIAGNOSIS — E559 Vitamin D deficiency, unspecified: Secondary | ICD-10-CM | POA: Diagnosis not present

## 2016-07-07 DIAGNOSIS — N2581 Secondary hyperparathyroidism of renal origin: Secondary | ICD-10-CM | POA: Diagnosis not present

## 2016-07-08 DIAGNOSIS — D631 Anemia in chronic kidney disease: Secondary | ICD-10-CM | POA: Diagnosis not present

## 2016-07-08 DIAGNOSIS — N2581 Secondary hyperparathyroidism of renal origin: Secondary | ICD-10-CM | POA: Diagnosis not present

## 2016-07-08 DIAGNOSIS — E559 Vitamin D deficiency, unspecified: Secondary | ICD-10-CM | POA: Diagnosis not present

## 2016-07-08 DIAGNOSIS — D509 Iron deficiency anemia, unspecified: Secondary | ICD-10-CM | POA: Diagnosis not present

## 2016-07-08 DIAGNOSIS — Z992 Dependence on renal dialysis: Secondary | ICD-10-CM | POA: Diagnosis not present

## 2016-07-08 DIAGNOSIS — N186 End stage renal disease: Secondary | ICD-10-CM | POA: Diagnosis not present

## 2016-07-09 DIAGNOSIS — N2581 Secondary hyperparathyroidism of renal origin: Secondary | ICD-10-CM | POA: Diagnosis not present

## 2016-07-09 DIAGNOSIS — D509 Iron deficiency anemia, unspecified: Secondary | ICD-10-CM | POA: Diagnosis not present

## 2016-07-09 DIAGNOSIS — D631 Anemia in chronic kidney disease: Secondary | ICD-10-CM | POA: Diagnosis not present

## 2016-07-09 DIAGNOSIS — N186 End stage renal disease: Secondary | ICD-10-CM | POA: Diagnosis not present

## 2016-07-09 DIAGNOSIS — E559 Vitamin D deficiency, unspecified: Secondary | ICD-10-CM | POA: Diagnosis not present

## 2016-07-09 DIAGNOSIS — Z992 Dependence on renal dialysis: Secondary | ICD-10-CM | POA: Diagnosis not present

## 2016-07-10 DIAGNOSIS — D509 Iron deficiency anemia, unspecified: Secondary | ICD-10-CM | POA: Diagnosis not present

## 2016-07-10 DIAGNOSIS — E559 Vitamin D deficiency, unspecified: Secondary | ICD-10-CM | POA: Diagnosis not present

## 2016-07-10 DIAGNOSIS — D631 Anemia in chronic kidney disease: Secondary | ICD-10-CM | POA: Diagnosis not present

## 2016-07-10 DIAGNOSIS — N186 End stage renal disease: Secondary | ICD-10-CM | POA: Diagnosis not present

## 2016-07-10 DIAGNOSIS — N2581 Secondary hyperparathyroidism of renal origin: Secondary | ICD-10-CM | POA: Diagnosis not present

## 2016-07-10 DIAGNOSIS — Z992 Dependence on renal dialysis: Secondary | ICD-10-CM | POA: Diagnosis not present

## 2016-07-11 DIAGNOSIS — E559 Vitamin D deficiency, unspecified: Secondary | ICD-10-CM | POA: Diagnosis not present

## 2016-07-11 DIAGNOSIS — N186 End stage renal disease: Secondary | ICD-10-CM | POA: Diagnosis not present

## 2016-07-11 DIAGNOSIS — D631 Anemia in chronic kidney disease: Secondary | ICD-10-CM | POA: Diagnosis not present

## 2016-07-11 DIAGNOSIS — N2581 Secondary hyperparathyroidism of renal origin: Secondary | ICD-10-CM | POA: Diagnosis not present

## 2016-07-11 DIAGNOSIS — Z992 Dependence on renal dialysis: Secondary | ICD-10-CM | POA: Diagnosis not present

## 2016-07-11 DIAGNOSIS — D509 Iron deficiency anemia, unspecified: Secondary | ICD-10-CM | POA: Diagnosis not present

## 2016-07-12 DIAGNOSIS — N2581 Secondary hyperparathyroidism of renal origin: Secondary | ICD-10-CM | POA: Diagnosis not present

## 2016-07-12 DIAGNOSIS — E559 Vitamin D deficiency, unspecified: Secondary | ICD-10-CM | POA: Diagnosis not present

## 2016-07-12 DIAGNOSIS — D631 Anemia in chronic kidney disease: Secondary | ICD-10-CM | POA: Diagnosis not present

## 2016-07-12 DIAGNOSIS — D509 Iron deficiency anemia, unspecified: Secondary | ICD-10-CM | POA: Diagnosis not present

## 2016-07-12 DIAGNOSIS — N186 End stage renal disease: Secondary | ICD-10-CM | POA: Diagnosis not present

## 2016-07-12 DIAGNOSIS — Z992 Dependence on renal dialysis: Secondary | ICD-10-CM | POA: Diagnosis not present

## 2016-07-13 DIAGNOSIS — E559 Vitamin D deficiency, unspecified: Secondary | ICD-10-CM | POA: Diagnosis not present

## 2016-07-13 DIAGNOSIS — Z992 Dependence on renal dialysis: Secondary | ICD-10-CM | POA: Diagnosis not present

## 2016-07-13 DIAGNOSIS — D631 Anemia in chronic kidney disease: Secondary | ICD-10-CM | POA: Diagnosis not present

## 2016-07-13 DIAGNOSIS — N2581 Secondary hyperparathyroidism of renal origin: Secondary | ICD-10-CM | POA: Diagnosis not present

## 2016-07-13 DIAGNOSIS — N186 End stage renal disease: Secondary | ICD-10-CM | POA: Diagnosis not present

## 2016-07-13 DIAGNOSIS — D509 Iron deficiency anemia, unspecified: Secondary | ICD-10-CM | POA: Diagnosis not present

## 2016-07-14 DIAGNOSIS — E559 Vitamin D deficiency, unspecified: Secondary | ICD-10-CM | POA: Diagnosis not present

## 2016-07-14 DIAGNOSIS — D509 Iron deficiency anemia, unspecified: Secondary | ICD-10-CM | POA: Diagnosis not present

## 2016-07-14 DIAGNOSIS — Z992 Dependence on renal dialysis: Secondary | ICD-10-CM | POA: Diagnosis not present

## 2016-07-14 DIAGNOSIS — D631 Anemia in chronic kidney disease: Secondary | ICD-10-CM | POA: Diagnosis not present

## 2016-07-14 DIAGNOSIS — N2581 Secondary hyperparathyroidism of renal origin: Secondary | ICD-10-CM | POA: Diagnosis not present

## 2016-07-14 DIAGNOSIS — N186 End stage renal disease: Secondary | ICD-10-CM | POA: Diagnosis not present

## 2016-07-15 DIAGNOSIS — Z992 Dependence on renal dialysis: Secondary | ICD-10-CM | POA: Diagnosis not present

## 2016-07-15 DIAGNOSIS — E559 Vitamin D deficiency, unspecified: Secondary | ICD-10-CM | POA: Diagnosis not present

## 2016-07-15 DIAGNOSIS — D509 Iron deficiency anemia, unspecified: Secondary | ICD-10-CM | POA: Diagnosis not present

## 2016-07-15 DIAGNOSIS — N2581 Secondary hyperparathyroidism of renal origin: Secondary | ICD-10-CM | POA: Diagnosis not present

## 2016-07-15 DIAGNOSIS — N186 End stage renal disease: Secondary | ICD-10-CM | POA: Diagnosis not present

## 2016-07-15 DIAGNOSIS — D631 Anemia in chronic kidney disease: Secondary | ICD-10-CM | POA: Diagnosis not present

## 2016-07-16 DIAGNOSIS — E559 Vitamin D deficiency, unspecified: Secondary | ICD-10-CM | POA: Diagnosis not present

## 2016-07-16 DIAGNOSIS — N2581 Secondary hyperparathyroidism of renal origin: Secondary | ICD-10-CM | POA: Diagnosis not present

## 2016-07-16 DIAGNOSIS — Z992 Dependence on renal dialysis: Secondary | ICD-10-CM | POA: Diagnosis not present

## 2016-07-16 DIAGNOSIS — D509 Iron deficiency anemia, unspecified: Secondary | ICD-10-CM | POA: Diagnosis not present

## 2016-07-16 DIAGNOSIS — D631 Anemia in chronic kidney disease: Secondary | ICD-10-CM | POA: Diagnosis not present

## 2016-07-16 DIAGNOSIS — N186 End stage renal disease: Secondary | ICD-10-CM | POA: Diagnosis not present

## 2016-07-17 DIAGNOSIS — D509 Iron deficiency anemia, unspecified: Secondary | ICD-10-CM | POA: Diagnosis not present

## 2016-07-17 DIAGNOSIS — D631 Anemia in chronic kidney disease: Secondary | ICD-10-CM | POA: Diagnosis not present

## 2016-07-17 DIAGNOSIS — Z992 Dependence on renal dialysis: Secondary | ICD-10-CM | POA: Diagnosis not present

## 2016-07-17 DIAGNOSIS — N186 End stage renal disease: Secondary | ICD-10-CM | POA: Diagnosis not present

## 2016-07-17 DIAGNOSIS — E559 Vitamin D deficiency, unspecified: Secondary | ICD-10-CM | POA: Diagnosis not present

## 2016-07-17 DIAGNOSIS — N2581 Secondary hyperparathyroidism of renal origin: Secondary | ICD-10-CM | POA: Diagnosis not present

## 2016-07-18 DIAGNOSIS — Z992 Dependence on renal dialysis: Secondary | ICD-10-CM | POA: Diagnosis not present

## 2016-07-18 DIAGNOSIS — N2581 Secondary hyperparathyroidism of renal origin: Secondary | ICD-10-CM | POA: Diagnosis not present

## 2016-07-18 DIAGNOSIS — E559 Vitamin D deficiency, unspecified: Secondary | ICD-10-CM | POA: Diagnosis not present

## 2016-07-18 DIAGNOSIS — N186 End stage renal disease: Secondary | ICD-10-CM | POA: Diagnosis not present

## 2016-07-18 DIAGNOSIS — D631 Anemia in chronic kidney disease: Secondary | ICD-10-CM | POA: Diagnosis not present

## 2016-07-18 DIAGNOSIS — D509 Iron deficiency anemia, unspecified: Secondary | ICD-10-CM | POA: Diagnosis not present

## 2016-07-19 DIAGNOSIS — N186 End stage renal disease: Secondary | ICD-10-CM | POA: Diagnosis not present

## 2016-07-19 DIAGNOSIS — N2581 Secondary hyperparathyroidism of renal origin: Secondary | ICD-10-CM | POA: Diagnosis not present

## 2016-07-19 DIAGNOSIS — Z992 Dependence on renal dialysis: Secondary | ICD-10-CM | POA: Diagnosis not present

## 2016-07-19 DIAGNOSIS — E559 Vitamin D deficiency, unspecified: Secondary | ICD-10-CM | POA: Diagnosis not present

## 2016-07-19 DIAGNOSIS — D631 Anemia in chronic kidney disease: Secondary | ICD-10-CM | POA: Diagnosis not present

## 2016-07-19 DIAGNOSIS — D509 Iron deficiency anemia, unspecified: Secondary | ICD-10-CM | POA: Diagnosis not present

## 2016-07-20 DIAGNOSIS — Z992 Dependence on renal dialysis: Secondary | ICD-10-CM | POA: Diagnosis not present

## 2016-07-20 DIAGNOSIS — D509 Iron deficiency anemia, unspecified: Secondary | ICD-10-CM | POA: Diagnosis not present

## 2016-07-20 DIAGNOSIS — N186 End stage renal disease: Secondary | ICD-10-CM | POA: Diagnosis not present

## 2016-07-20 DIAGNOSIS — N2581 Secondary hyperparathyroidism of renal origin: Secondary | ICD-10-CM | POA: Diagnosis not present

## 2016-07-20 DIAGNOSIS — E559 Vitamin D deficiency, unspecified: Secondary | ICD-10-CM | POA: Diagnosis not present

## 2016-07-20 DIAGNOSIS — D631 Anemia in chronic kidney disease: Secondary | ICD-10-CM | POA: Diagnosis not present

## 2016-07-21 DIAGNOSIS — E559 Vitamin D deficiency, unspecified: Secondary | ICD-10-CM | POA: Diagnosis not present

## 2016-07-21 DIAGNOSIS — D509 Iron deficiency anemia, unspecified: Secondary | ICD-10-CM | POA: Diagnosis not present

## 2016-07-21 DIAGNOSIS — Z992 Dependence on renal dialysis: Secondary | ICD-10-CM | POA: Diagnosis not present

## 2016-07-21 DIAGNOSIS — D631 Anemia in chronic kidney disease: Secondary | ICD-10-CM | POA: Diagnosis not present

## 2016-07-21 DIAGNOSIS — N186 End stage renal disease: Secondary | ICD-10-CM | POA: Diagnosis not present

## 2016-07-21 DIAGNOSIS — N2581 Secondary hyperparathyroidism of renal origin: Secondary | ICD-10-CM | POA: Diagnosis not present

## 2016-07-22 DIAGNOSIS — D631 Anemia in chronic kidney disease: Secondary | ICD-10-CM | POA: Diagnosis not present

## 2016-07-22 DIAGNOSIS — N186 End stage renal disease: Secondary | ICD-10-CM | POA: Diagnosis not present

## 2016-07-22 DIAGNOSIS — Z992 Dependence on renal dialysis: Secondary | ICD-10-CM | POA: Diagnosis not present

## 2016-07-22 DIAGNOSIS — N2581 Secondary hyperparathyroidism of renal origin: Secondary | ICD-10-CM | POA: Diagnosis not present

## 2016-07-22 DIAGNOSIS — D509 Iron deficiency anemia, unspecified: Secondary | ICD-10-CM | POA: Diagnosis not present

## 2016-07-22 DIAGNOSIS — E559 Vitamin D deficiency, unspecified: Secondary | ICD-10-CM | POA: Diagnosis not present

## 2016-07-23 DIAGNOSIS — N186 End stage renal disease: Secondary | ICD-10-CM | POA: Diagnosis not present

## 2016-07-23 DIAGNOSIS — D631 Anemia in chronic kidney disease: Secondary | ICD-10-CM | POA: Diagnosis not present

## 2016-07-23 DIAGNOSIS — N2581 Secondary hyperparathyroidism of renal origin: Secondary | ICD-10-CM | POA: Diagnosis not present

## 2016-07-23 DIAGNOSIS — Z992 Dependence on renal dialysis: Secondary | ICD-10-CM | POA: Diagnosis not present

## 2016-07-23 DIAGNOSIS — D509 Iron deficiency anemia, unspecified: Secondary | ICD-10-CM | POA: Diagnosis not present

## 2016-07-23 DIAGNOSIS — E559 Vitamin D deficiency, unspecified: Secondary | ICD-10-CM | POA: Diagnosis not present

## 2016-07-24 DIAGNOSIS — D631 Anemia in chronic kidney disease: Secondary | ICD-10-CM | POA: Diagnosis not present

## 2016-07-24 DIAGNOSIS — N2581 Secondary hyperparathyroidism of renal origin: Secondary | ICD-10-CM | POA: Diagnosis not present

## 2016-07-24 DIAGNOSIS — E559 Vitamin D deficiency, unspecified: Secondary | ICD-10-CM | POA: Diagnosis not present

## 2016-07-24 DIAGNOSIS — Z992 Dependence on renal dialysis: Secondary | ICD-10-CM | POA: Diagnosis not present

## 2016-07-24 DIAGNOSIS — D509 Iron deficiency anemia, unspecified: Secondary | ICD-10-CM | POA: Diagnosis not present

## 2016-07-24 DIAGNOSIS — N186 End stage renal disease: Secondary | ICD-10-CM | POA: Diagnosis not present

## 2016-07-25 DIAGNOSIS — N2581 Secondary hyperparathyroidism of renal origin: Secondary | ICD-10-CM | POA: Diagnosis not present

## 2016-07-25 DIAGNOSIS — D509 Iron deficiency anemia, unspecified: Secondary | ICD-10-CM | POA: Diagnosis not present

## 2016-07-25 DIAGNOSIS — N186 End stage renal disease: Secondary | ICD-10-CM | POA: Diagnosis not present

## 2016-07-25 DIAGNOSIS — Z992 Dependence on renal dialysis: Secondary | ICD-10-CM | POA: Diagnosis not present

## 2016-07-25 DIAGNOSIS — E559 Vitamin D deficiency, unspecified: Secondary | ICD-10-CM | POA: Diagnosis not present

## 2016-07-25 DIAGNOSIS — D631 Anemia in chronic kidney disease: Secondary | ICD-10-CM | POA: Diagnosis not present

## 2016-07-26 DIAGNOSIS — N186 End stage renal disease: Secondary | ICD-10-CM | POA: Diagnosis not present

## 2016-07-26 DIAGNOSIS — Z992 Dependence on renal dialysis: Secondary | ICD-10-CM | POA: Diagnosis not present

## 2016-07-26 DIAGNOSIS — D631 Anemia in chronic kidney disease: Secondary | ICD-10-CM | POA: Diagnosis not present

## 2016-07-26 DIAGNOSIS — D509 Iron deficiency anemia, unspecified: Secondary | ICD-10-CM | POA: Diagnosis not present

## 2016-07-26 DIAGNOSIS — N2581 Secondary hyperparathyroidism of renal origin: Secondary | ICD-10-CM | POA: Diagnosis not present

## 2016-07-27 DIAGNOSIS — D509 Iron deficiency anemia, unspecified: Secondary | ICD-10-CM | POA: Diagnosis not present

## 2016-07-27 DIAGNOSIS — D631 Anemia in chronic kidney disease: Secondary | ICD-10-CM | POA: Diagnosis not present

## 2016-07-27 DIAGNOSIS — Z992 Dependence on renal dialysis: Secondary | ICD-10-CM | POA: Diagnosis not present

## 2016-07-27 DIAGNOSIS — N186 End stage renal disease: Secondary | ICD-10-CM | POA: Diagnosis not present

## 2016-07-27 DIAGNOSIS — N2581 Secondary hyperparathyroidism of renal origin: Secondary | ICD-10-CM | POA: Diagnosis not present

## 2016-07-28 DIAGNOSIS — N2581 Secondary hyperparathyroidism of renal origin: Secondary | ICD-10-CM | POA: Diagnosis not present

## 2016-07-28 DIAGNOSIS — Z992 Dependence on renal dialysis: Secondary | ICD-10-CM | POA: Diagnosis not present

## 2016-07-28 DIAGNOSIS — D509 Iron deficiency anemia, unspecified: Secondary | ICD-10-CM | POA: Diagnosis not present

## 2016-07-28 DIAGNOSIS — N186 End stage renal disease: Secondary | ICD-10-CM | POA: Diagnosis not present

## 2016-07-28 DIAGNOSIS — D631 Anemia in chronic kidney disease: Secondary | ICD-10-CM | POA: Diagnosis not present

## 2016-07-29 DIAGNOSIS — D509 Iron deficiency anemia, unspecified: Secondary | ICD-10-CM | POA: Diagnosis not present

## 2016-07-29 DIAGNOSIS — D631 Anemia in chronic kidney disease: Secondary | ICD-10-CM | POA: Diagnosis not present

## 2016-07-29 DIAGNOSIS — N186 End stage renal disease: Secondary | ICD-10-CM | POA: Diagnosis not present

## 2016-07-29 DIAGNOSIS — N2581 Secondary hyperparathyroidism of renal origin: Secondary | ICD-10-CM | POA: Diagnosis not present

## 2016-07-29 DIAGNOSIS — Z992 Dependence on renal dialysis: Secondary | ICD-10-CM | POA: Diagnosis not present

## 2016-07-30 DIAGNOSIS — Z992 Dependence on renal dialysis: Secondary | ICD-10-CM | POA: Diagnosis not present

## 2016-07-30 DIAGNOSIS — N186 End stage renal disease: Secondary | ICD-10-CM | POA: Diagnosis not present

## 2016-07-30 DIAGNOSIS — D509 Iron deficiency anemia, unspecified: Secondary | ICD-10-CM | POA: Diagnosis not present

## 2016-07-30 DIAGNOSIS — N2581 Secondary hyperparathyroidism of renal origin: Secondary | ICD-10-CM | POA: Diagnosis not present

## 2016-07-30 DIAGNOSIS — D631 Anemia in chronic kidney disease: Secondary | ICD-10-CM | POA: Diagnosis not present

## 2016-07-31 DIAGNOSIS — N2581 Secondary hyperparathyroidism of renal origin: Secondary | ICD-10-CM | POA: Diagnosis not present

## 2016-07-31 DIAGNOSIS — Z992 Dependence on renal dialysis: Secondary | ICD-10-CM | POA: Diagnosis not present

## 2016-07-31 DIAGNOSIS — D509 Iron deficiency anemia, unspecified: Secondary | ICD-10-CM | POA: Diagnosis not present

## 2016-07-31 DIAGNOSIS — N186 End stage renal disease: Secondary | ICD-10-CM | POA: Diagnosis not present

## 2016-07-31 DIAGNOSIS — D631 Anemia in chronic kidney disease: Secondary | ICD-10-CM | POA: Diagnosis not present

## 2016-08-01 DIAGNOSIS — D509 Iron deficiency anemia, unspecified: Secondary | ICD-10-CM | POA: Diagnosis not present

## 2016-08-01 DIAGNOSIS — D631 Anemia in chronic kidney disease: Secondary | ICD-10-CM | POA: Diagnosis not present

## 2016-08-01 DIAGNOSIS — N186 End stage renal disease: Secondary | ICD-10-CM | POA: Diagnosis not present

## 2016-08-01 DIAGNOSIS — N2581 Secondary hyperparathyroidism of renal origin: Secondary | ICD-10-CM | POA: Diagnosis not present

## 2016-08-01 DIAGNOSIS — Z992 Dependence on renal dialysis: Secondary | ICD-10-CM | POA: Diagnosis not present

## 2016-08-02 DIAGNOSIS — N2581 Secondary hyperparathyroidism of renal origin: Secondary | ICD-10-CM | POA: Diagnosis not present

## 2016-08-02 DIAGNOSIS — Z992 Dependence on renal dialysis: Secondary | ICD-10-CM | POA: Diagnosis not present

## 2016-08-02 DIAGNOSIS — N186 End stage renal disease: Secondary | ICD-10-CM | POA: Diagnosis not present

## 2016-08-02 DIAGNOSIS — D631 Anemia in chronic kidney disease: Secondary | ICD-10-CM | POA: Diagnosis not present

## 2016-08-02 DIAGNOSIS — D509 Iron deficiency anemia, unspecified: Secondary | ICD-10-CM | POA: Diagnosis not present

## 2016-08-03 DIAGNOSIS — Z992 Dependence on renal dialysis: Secondary | ICD-10-CM | POA: Diagnosis not present

## 2016-08-03 DIAGNOSIS — D509 Iron deficiency anemia, unspecified: Secondary | ICD-10-CM | POA: Diagnosis not present

## 2016-08-03 DIAGNOSIS — N186 End stage renal disease: Secondary | ICD-10-CM | POA: Diagnosis not present

## 2016-08-03 DIAGNOSIS — D631 Anemia in chronic kidney disease: Secondary | ICD-10-CM | POA: Diagnosis not present

## 2016-08-03 DIAGNOSIS — N2581 Secondary hyperparathyroidism of renal origin: Secondary | ICD-10-CM | POA: Diagnosis not present

## 2016-08-04 DIAGNOSIS — Z992 Dependence on renal dialysis: Secondary | ICD-10-CM | POA: Diagnosis not present

## 2016-08-04 DIAGNOSIS — D509 Iron deficiency anemia, unspecified: Secondary | ICD-10-CM | POA: Diagnosis not present

## 2016-08-04 DIAGNOSIS — D631 Anemia in chronic kidney disease: Secondary | ICD-10-CM | POA: Diagnosis not present

## 2016-08-04 DIAGNOSIS — N186 End stage renal disease: Secondary | ICD-10-CM | POA: Diagnosis not present

## 2016-08-04 DIAGNOSIS — N2581 Secondary hyperparathyroidism of renal origin: Secondary | ICD-10-CM | POA: Diagnosis not present

## 2016-08-05 DIAGNOSIS — N2581 Secondary hyperparathyroidism of renal origin: Secondary | ICD-10-CM | POA: Diagnosis not present

## 2016-08-05 DIAGNOSIS — D631 Anemia in chronic kidney disease: Secondary | ICD-10-CM | POA: Diagnosis not present

## 2016-08-05 DIAGNOSIS — N186 End stage renal disease: Secondary | ICD-10-CM | POA: Diagnosis not present

## 2016-08-05 DIAGNOSIS — Z992 Dependence on renal dialysis: Secondary | ICD-10-CM | POA: Diagnosis not present

## 2016-08-05 DIAGNOSIS — D509 Iron deficiency anemia, unspecified: Secondary | ICD-10-CM | POA: Diagnosis not present

## 2016-08-06 DIAGNOSIS — D631 Anemia in chronic kidney disease: Secondary | ICD-10-CM | POA: Diagnosis not present

## 2016-08-06 DIAGNOSIS — Z992 Dependence on renal dialysis: Secondary | ICD-10-CM | POA: Diagnosis not present

## 2016-08-06 DIAGNOSIS — N186 End stage renal disease: Secondary | ICD-10-CM | POA: Diagnosis not present

## 2016-08-06 DIAGNOSIS — N2581 Secondary hyperparathyroidism of renal origin: Secondary | ICD-10-CM | POA: Diagnosis not present

## 2016-08-06 DIAGNOSIS — D509 Iron deficiency anemia, unspecified: Secondary | ICD-10-CM | POA: Diagnosis not present

## 2016-08-07 DIAGNOSIS — N186 End stage renal disease: Secondary | ICD-10-CM | POA: Diagnosis not present

## 2016-08-07 DIAGNOSIS — N2581 Secondary hyperparathyroidism of renal origin: Secondary | ICD-10-CM | POA: Diagnosis not present

## 2016-08-07 DIAGNOSIS — Z992 Dependence on renal dialysis: Secondary | ICD-10-CM | POA: Diagnosis not present

## 2016-08-07 DIAGNOSIS — D509 Iron deficiency anemia, unspecified: Secondary | ICD-10-CM | POA: Diagnosis not present

## 2016-08-07 DIAGNOSIS — D631 Anemia in chronic kidney disease: Secondary | ICD-10-CM | POA: Diagnosis not present

## 2016-08-08 DIAGNOSIS — N2581 Secondary hyperparathyroidism of renal origin: Secondary | ICD-10-CM | POA: Diagnosis not present

## 2016-08-08 DIAGNOSIS — D509 Iron deficiency anemia, unspecified: Secondary | ICD-10-CM | POA: Diagnosis not present

## 2016-08-08 DIAGNOSIS — D631 Anemia in chronic kidney disease: Secondary | ICD-10-CM | POA: Diagnosis not present

## 2016-08-08 DIAGNOSIS — Z992 Dependence on renal dialysis: Secondary | ICD-10-CM | POA: Diagnosis not present

## 2016-08-08 DIAGNOSIS — N186 End stage renal disease: Secondary | ICD-10-CM | POA: Diagnosis not present

## 2016-08-09 DIAGNOSIS — D509 Iron deficiency anemia, unspecified: Secondary | ICD-10-CM | POA: Diagnosis not present

## 2016-08-09 DIAGNOSIS — N2581 Secondary hyperparathyroidism of renal origin: Secondary | ICD-10-CM | POA: Diagnosis not present

## 2016-08-09 DIAGNOSIS — D631 Anemia in chronic kidney disease: Secondary | ICD-10-CM | POA: Diagnosis not present

## 2016-08-09 DIAGNOSIS — Z992 Dependence on renal dialysis: Secondary | ICD-10-CM | POA: Diagnosis not present

## 2016-08-09 DIAGNOSIS — N186 End stage renal disease: Secondary | ICD-10-CM | POA: Diagnosis not present

## 2016-08-10 DIAGNOSIS — Z992 Dependence on renal dialysis: Secondary | ICD-10-CM | POA: Diagnosis not present

## 2016-08-10 DIAGNOSIS — N186 End stage renal disease: Secondary | ICD-10-CM | POA: Diagnosis not present

## 2016-08-10 DIAGNOSIS — D509 Iron deficiency anemia, unspecified: Secondary | ICD-10-CM | POA: Diagnosis not present

## 2016-08-10 DIAGNOSIS — N2581 Secondary hyperparathyroidism of renal origin: Secondary | ICD-10-CM | POA: Diagnosis not present

## 2016-08-10 DIAGNOSIS — D631 Anemia in chronic kidney disease: Secondary | ICD-10-CM | POA: Diagnosis not present

## 2016-08-11 DIAGNOSIS — Z992 Dependence on renal dialysis: Secondary | ICD-10-CM | POA: Diagnosis not present

## 2016-08-11 DIAGNOSIS — N2581 Secondary hyperparathyroidism of renal origin: Secondary | ICD-10-CM | POA: Diagnosis not present

## 2016-08-11 DIAGNOSIS — D631 Anemia in chronic kidney disease: Secondary | ICD-10-CM | POA: Diagnosis not present

## 2016-08-11 DIAGNOSIS — N186 End stage renal disease: Secondary | ICD-10-CM | POA: Diagnosis not present

## 2016-08-11 DIAGNOSIS — D509 Iron deficiency anemia, unspecified: Secondary | ICD-10-CM | POA: Diagnosis not present

## 2016-08-12 DIAGNOSIS — D631 Anemia in chronic kidney disease: Secondary | ICD-10-CM | POA: Diagnosis not present

## 2016-08-12 DIAGNOSIS — Z992 Dependence on renal dialysis: Secondary | ICD-10-CM | POA: Diagnosis not present

## 2016-08-12 DIAGNOSIS — N186 End stage renal disease: Secondary | ICD-10-CM | POA: Diagnosis not present

## 2016-08-12 DIAGNOSIS — D509 Iron deficiency anemia, unspecified: Secondary | ICD-10-CM | POA: Diagnosis not present

## 2016-08-12 DIAGNOSIS — N2581 Secondary hyperparathyroidism of renal origin: Secondary | ICD-10-CM | POA: Diagnosis not present

## 2016-08-13 DIAGNOSIS — N186 End stage renal disease: Secondary | ICD-10-CM | POA: Diagnosis not present

## 2016-08-13 DIAGNOSIS — D631 Anemia in chronic kidney disease: Secondary | ICD-10-CM | POA: Diagnosis not present

## 2016-08-13 DIAGNOSIS — Z992 Dependence on renal dialysis: Secondary | ICD-10-CM | POA: Diagnosis not present

## 2016-08-13 DIAGNOSIS — N2581 Secondary hyperparathyroidism of renal origin: Secondary | ICD-10-CM | POA: Diagnosis not present

## 2016-08-13 DIAGNOSIS — D509 Iron deficiency anemia, unspecified: Secondary | ICD-10-CM | POA: Diagnosis not present

## 2016-08-14 DIAGNOSIS — N186 End stage renal disease: Secondary | ICD-10-CM | POA: Diagnosis not present

## 2016-08-14 DIAGNOSIS — D631 Anemia in chronic kidney disease: Secondary | ICD-10-CM | POA: Diagnosis not present

## 2016-08-14 DIAGNOSIS — D509 Iron deficiency anemia, unspecified: Secondary | ICD-10-CM | POA: Diagnosis not present

## 2016-08-14 DIAGNOSIS — Z992 Dependence on renal dialysis: Secondary | ICD-10-CM | POA: Diagnosis not present

## 2016-08-14 DIAGNOSIS — N2581 Secondary hyperparathyroidism of renal origin: Secondary | ICD-10-CM | POA: Diagnosis not present

## 2016-08-15 DIAGNOSIS — N186 End stage renal disease: Secondary | ICD-10-CM | POA: Diagnosis not present

## 2016-08-15 DIAGNOSIS — D509 Iron deficiency anemia, unspecified: Secondary | ICD-10-CM | POA: Diagnosis not present

## 2016-08-15 DIAGNOSIS — N2581 Secondary hyperparathyroidism of renal origin: Secondary | ICD-10-CM | POA: Diagnosis not present

## 2016-08-15 DIAGNOSIS — D631 Anemia in chronic kidney disease: Secondary | ICD-10-CM | POA: Diagnosis not present

## 2016-08-15 DIAGNOSIS — Z992 Dependence on renal dialysis: Secondary | ICD-10-CM | POA: Diagnosis not present

## 2016-08-16 DIAGNOSIS — Z992 Dependence on renal dialysis: Secondary | ICD-10-CM | POA: Diagnosis not present

## 2016-08-16 DIAGNOSIS — N2581 Secondary hyperparathyroidism of renal origin: Secondary | ICD-10-CM | POA: Diagnosis not present

## 2016-08-16 DIAGNOSIS — D631 Anemia in chronic kidney disease: Secondary | ICD-10-CM | POA: Diagnosis not present

## 2016-08-16 DIAGNOSIS — N186 End stage renal disease: Secondary | ICD-10-CM | POA: Diagnosis not present

## 2016-08-16 DIAGNOSIS — D509 Iron deficiency anemia, unspecified: Secondary | ICD-10-CM | POA: Diagnosis not present

## 2016-08-17 DIAGNOSIS — N2581 Secondary hyperparathyroidism of renal origin: Secondary | ICD-10-CM | POA: Diagnosis not present

## 2016-08-17 DIAGNOSIS — Z992 Dependence on renal dialysis: Secondary | ICD-10-CM | POA: Diagnosis not present

## 2016-08-17 DIAGNOSIS — D631 Anemia in chronic kidney disease: Secondary | ICD-10-CM | POA: Diagnosis not present

## 2016-08-17 DIAGNOSIS — D509 Iron deficiency anemia, unspecified: Secondary | ICD-10-CM | POA: Diagnosis not present

## 2016-08-17 DIAGNOSIS — N186 End stage renal disease: Secondary | ICD-10-CM | POA: Diagnosis not present

## 2016-08-18 DIAGNOSIS — N2581 Secondary hyperparathyroidism of renal origin: Secondary | ICD-10-CM | POA: Diagnosis not present

## 2016-08-18 DIAGNOSIS — N186 End stage renal disease: Secondary | ICD-10-CM | POA: Diagnosis not present

## 2016-08-18 DIAGNOSIS — Z992 Dependence on renal dialysis: Secondary | ICD-10-CM | POA: Diagnosis not present

## 2016-08-18 DIAGNOSIS — D509 Iron deficiency anemia, unspecified: Secondary | ICD-10-CM | POA: Diagnosis not present

## 2016-08-18 DIAGNOSIS — D631 Anemia in chronic kidney disease: Secondary | ICD-10-CM | POA: Diagnosis not present

## 2016-08-19 DIAGNOSIS — D509 Iron deficiency anemia, unspecified: Secondary | ICD-10-CM | POA: Diagnosis not present

## 2016-08-19 DIAGNOSIS — Z992 Dependence on renal dialysis: Secondary | ICD-10-CM | POA: Diagnosis not present

## 2016-08-19 DIAGNOSIS — N186 End stage renal disease: Secondary | ICD-10-CM | POA: Diagnosis not present

## 2016-08-19 DIAGNOSIS — D631 Anemia in chronic kidney disease: Secondary | ICD-10-CM | POA: Diagnosis not present

## 2016-08-19 DIAGNOSIS — N2581 Secondary hyperparathyroidism of renal origin: Secondary | ICD-10-CM | POA: Diagnosis not present

## 2016-08-20 DIAGNOSIS — N186 End stage renal disease: Secondary | ICD-10-CM | POA: Diagnosis not present

## 2016-08-20 DIAGNOSIS — Z992 Dependence on renal dialysis: Secondary | ICD-10-CM | POA: Diagnosis not present

## 2016-08-20 DIAGNOSIS — D509 Iron deficiency anemia, unspecified: Secondary | ICD-10-CM | POA: Diagnosis not present

## 2016-08-20 DIAGNOSIS — N2581 Secondary hyperparathyroidism of renal origin: Secondary | ICD-10-CM | POA: Diagnosis not present

## 2016-08-20 DIAGNOSIS — D631 Anemia in chronic kidney disease: Secondary | ICD-10-CM | POA: Diagnosis not present

## 2016-08-21 DIAGNOSIS — D509 Iron deficiency anemia, unspecified: Secondary | ICD-10-CM | POA: Diagnosis not present

## 2016-08-21 DIAGNOSIS — D631 Anemia in chronic kidney disease: Secondary | ICD-10-CM | POA: Diagnosis not present

## 2016-08-21 DIAGNOSIS — N186 End stage renal disease: Secondary | ICD-10-CM | POA: Diagnosis not present

## 2016-08-21 DIAGNOSIS — N2581 Secondary hyperparathyroidism of renal origin: Secondary | ICD-10-CM | POA: Diagnosis not present

## 2016-08-21 DIAGNOSIS — Z992 Dependence on renal dialysis: Secondary | ICD-10-CM | POA: Diagnosis not present

## 2016-08-22 DIAGNOSIS — Z992 Dependence on renal dialysis: Secondary | ICD-10-CM | POA: Diagnosis not present

## 2016-08-22 DIAGNOSIS — D509 Iron deficiency anemia, unspecified: Secondary | ICD-10-CM | POA: Diagnosis not present

## 2016-08-22 DIAGNOSIS — N2581 Secondary hyperparathyroidism of renal origin: Secondary | ICD-10-CM | POA: Diagnosis not present

## 2016-08-22 DIAGNOSIS — N186 End stage renal disease: Secondary | ICD-10-CM | POA: Diagnosis not present

## 2016-08-22 DIAGNOSIS — D631 Anemia in chronic kidney disease: Secondary | ICD-10-CM | POA: Diagnosis not present

## 2016-08-23 DIAGNOSIS — Z992 Dependence on renal dialysis: Secondary | ICD-10-CM | POA: Diagnosis not present

## 2016-08-23 DIAGNOSIS — N2581 Secondary hyperparathyroidism of renal origin: Secondary | ICD-10-CM | POA: Diagnosis not present

## 2016-08-23 DIAGNOSIS — N186 End stage renal disease: Secondary | ICD-10-CM | POA: Diagnosis not present

## 2016-08-23 DIAGNOSIS — D509 Iron deficiency anemia, unspecified: Secondary | ICD-10-CM | POA: Diagnosis not present

## 2016-08-23 DIAGNOSIS — D631 Anemia in chronic kidney disease: Secondary | ICD-10-CM | POA: Diagnosis not present

## 2016-08-24 DIAGNOSIS — D509 Iron deficiency anemia, unspecified: Secondary | ICD-10-CM | POA: Diagnosis not present

## 2016-08-24 DIAGNOSIS — N2581 Secondary hyperparathyroidism of renal origin: Secondary | ICD-10-CM | POA: Diagnosis not present

## 2016-08-24 DIAGNOSIS — Z992 Dependence on renal dialysis: Secondary | ICD-10-CM | POA: Diagnosis not present

## 2016-08-24 DIAGNOSIS — D631 Anemia in chronic kidney disease: Secondary | ICD-10-CM | POA: Diagnosis not present

## 2016-08-24 DIAGNOSIS — N186 End stage renal disease: Secondary | ICD-10-CM | POA: Diagnosis not present

## 2016-08-25 DIAGNOSIS — D631 Anemia in chronic kidney disease: Secondary | ICD-10-CM | POA: Diagnosis not present

## 2016-08-25 DIAGNOSIS — N2581 Secondary hyperparathyroidism of renal origin: Secondary | ICD-10-CM | POA: Diagnosis not present

## 2016-08-25 DIAGNOSIS — Z992 Dependence on renal dialysis: Secondary | ICD-10-CM | POA: Diagnosis not present

## 2016-08-25 DIAGNOSIS — N186 End stage renal disease: Secondary | ICD-10-CM | POA: Diagnosis not present

## 2016-08-25 DIAGNOSIS — D509 Iron deficiency anemia, unspecified: Secondary | ICD-10-CM | POA: Diagnosis not present

## 2016-08-26 DIAGNOSIS — N2581 Secondary hyperparathyroidism of renal origin: Secondary | ICD-10-CM | POA: Diagnosis not present

## 2016-08-26 DIAGNOSIS — D631 Anemia in chronic kidney disease: Secondary | ICD-10-CM | POA: Diagnosis not present

## 2016-08-26 DIAGNOSIS — Z992 Dependence on renal dialysis: Secondary | ICD-10-CM | POA: Diagnosis not present

## 2016-08-26 DIAGNOSIS — N186 End stage renal disease: Secondary | ICD-10-CM | POA: Diagnosis not present

## 2016-08-26 DIAGNOSIS — D509 Iron deficiency anemia, unspecified: Secondary | ICD-10-CM | POA: Diagnosis not present

## 2016-08-27 DIAGNOSIS — D509 Iron deficiency anemia, unspecified: Secondary | ICD-10-CM | POA: Diagnosis not present

## 2016-08-27 DIAGNOSIS — N186 End stage renal disease: Secondary | ICD-10-CM | POA: Diagnosis not present

## 2016-08-27 DIAGNOSIS — D631 Anemia in chronic kidney disease: Secondary | ICD-10-CM | POA: Diagnosis not present

## 2016-08-27 DIAGNOSIS — N2581 Secondary hyperparathyroidism of renal origin: Secondary | ICD-10-CM | POA: Diagnosis not present

## 2016-08-27 DIAGNOSIS — Z992 Dependence on renal dialysis: Secondary | ICD-10-CM | POA: Diagnosis not present

## 2016-08-28 DIAGNOSIS — N2581 Secondary hyperparathyroidism of renal origin: Secondary | ICD-10-CM | POA: Diagnosis not present

## 2016-08-28 DIAGNOSIS — D631 Anemia in chronic kidney disease: Secondary | ICD-10-CM | POA: Diagnosis not present

## 2016-08-28 DIAGNOSIS — Z992 Dependence on renal dialysis: Secondary | ICD-10-CM | POA: Diagnosis not present

## 2016-08-28 DIAGNOSIS — D509 Iron deficiency anemia, unspecified: Secondary | ICD-10-CM | POA: Diagnosis not present

## 2016-08-28 DIAGNOSIS — N186 End stage renal disease: Secondary | ICD-10-CM | POA: Diagnosis not present

## 2016-08-29 DIAGNOSIS — D509 Iron deficiency anemia, unspecified: Secondary | ICD-10-CM | POA: Diagnosis not present

## 2016-08-29 DIAGNOSIS — N186 End stage renal disease: Secondary | ICD-10-CM | POA: Diagnosis not present

## 2016-08-29 DIAGNOSIS — D631 Anemia in chronic kidney disease: Secondary | ICD-10-CM | POA: Diagnosis not present

## 2016-08-29 DIAGNOSIS — N2581 Secondary hyperparathyroidism of renal origin: Secondary | ICD-10-CM | POA: Diagnosis not present

## 2016-08-29 DIAGNOSIS — Z992 Dependence on renal dialysis: Secondary | ICD-10-CM | POA: Diagnosis not present

## 2016-08-30 DIAGNOSIS — N186 End stage renal disease: Secondary | ICD-10-CM | POA: Diagnosis not present

## 2016-08-30 DIAGNOSIS — N2581 Secondary hyperparathyroidism of renal origin: Secondary | ICD-10-CM | POA: Diagnosis not present

## 2016-08-30 DIAGNOSIS — D631 Anemia in chronic kidney disease: Secondary | ICD-10-CM | POA: Diagnosis not present

## 2016-08-30 DIAGNOSIS — Z992 Dependence on renal dialysis: Secondary | ICD-10-CM | POA: Diagnosis not present

## 2016-08-30 DIAGNOSIS — D509 Iron deficiency anemia, unspecified: Secondary | ICD-10-CM | POA: Diagnosis not present

## 2016-08-31 DIAGNOSIS — Z992 Dependence on renal dialysis: Secondary | ICD-10-CM | POA: Diagnosis not present

## 2016-08-31 DIAGNOSIS — D509 Iron deficiency anemia, unspecified: Secondary | ICD-10-CM | POA: Diagnosis not present

## 2016-08-31 DIAGNOSIS — N186 End stage renal disease: Secondary | ICD-10-CM | POA: Diagnosis not present

## 2016-08-31 DIAGNOSIS — N2581 Secondary hyperparathyroidism of renal origin: Secondary | ICD-10-CM | POA: Diagnosis not present

## 2016-08-31 DIAGNOSIS — D631 Anemia in chronic kidney disease: Secondary | ICD-10-CM | POA: Diagnosis not present

## 2016-09-01 DIAGNOSIS — D509 Iron deficiency anemia, unspecified: Secondary | ICD-10-CM | POA: Diagnosis not present

## 2016-09-01 DIAGNOSIS — N186 End stage renal disease: Secondary | ICD-10-CM | POA: Diagnosis not present

## 2016-09-01 DIAGNOSIS — D631 Anemia in chronic kidney disease: Secondary | ICD-10-CM | POA: Diagnosis not present

## 2016-09-01 DIAGNOSIS — N2581 Secondary hyperparathyroidism of renal origin: Secondary | ICD-10-CM | POA: Diagnosis not present

## 2016-09-01 DIAGNOSIS — Z992 Dependence on renal dialysis: Secondary | ICD-10-CM | POA: Diagnosis not present

## 2016-09-02 DIAGNOSIS — D509 Iron deficiency anemia, unspecified: Secondary | ICD-10-CM | POA: Diagnosis not present

## 2016-09-02 DIAGNOSIS — N186 End stage renal disease: Secondary | ICD-10-CM | POA: Diagnosis not present

## 2016-09-02 DIAGNOSIS — Z992 Dependence on renal dialysis: Secondary | ICD-10-CM | POA: Diagnosis not present

## 2016-09-02 DIAGNOSIS — N2581 Secondary hyperparathyroidism of renal origin: Secondary | ICD-10-CM | POA: Diagnosis not present

## 2016-09-02 DIAGNOSIS — D631 Anemia in chronic kidney disease: Secondary | ICD-10-CM | POA: Diagnosis not present

## 2016-09-03 DIAGNOSIS — N2581 Secondary hyperparathyroidism of renal origin: Secondary | ICD-10-CM | POA: Diagnosis not present

## 2016-09-03 DIAGNOSIS — N186 End stage renal disease: Secondary | ICD-10-CM | POA: Diagnosis not present

## 2016-09-03 DIAGNOSIS — D631 Anemia in chronic kidney disease: Secondary | ICD-10-CM | POA: Diagnosis not present

## 2016-09-03 DIAGNOSIS — D509 Iron deficiency anemia, unspecified: Secondary | ICD-10-CM | POA: Diagnosis not present

## 2016-09-03 DIAGNOSIS — Z992 Dependence on renal dialysis: Secondary | ICD-10-CM | POA: Diagnosis not present

## 2016-09-04 DIAGNOSIS — N186 End stage renal disease: Secondary | ICD-10-CM | POA: Diagnosis not present

## 2016-09-04 DIAGNOSIS — D509 Iron deficiency anemia, unspecified: Secondary | ICD-10-CM | POA: Diagnosis not present

## 2016-09-04 DIAGNOSIS — N2581 Secondary hyperparathyroidism of renal origin: Secondary | ICD-10-CM | POA: Diagnosis not present

## 2016-09-04 DIAGNOSIS — D631 Anemia in chronic kidney disease: Secondary | ICD-10-CM | POA: Diagnosis not present

## 2016-09-04 DIAGNOSIS — Z992 Dependence on renal dialysis: Secondary | ICD-10-CM | POA: Diagnosis not present

## 2016-09-05 DIAGNOSIS — N186 End stage renal disease: Secondary | ICD-10-CM | POA: Diagnosis not present

## 2016-09-05 DIAGNOSIS — D631 Anemia in chronic kidney disease: Secondary | ICD-10-CM | POA: Diagnosis not present

## 2016-09-05 DIAGNOSIS — D509 Iron deficiency anemia, unspecified: Secondary | ICD-10-CM | POA: Diagnosis not present

## 2016-09-05 DIAGNOSIS — Z992 Dependence on renal dialysis: Secondary | ICD-10-CM | POA: Diagnosis not present

## 2016-09-05 DIAGNOSIS — N2581 Secondary hyperparathyroidism of renal origin: Secondary | ICD-10-CM | POA: Diagnosis not present

## 2016-09-06 DIAGNOSIS — N2581 Secondary hyperparathyroidism of renal origin: Secondary | ICD-10-CM | POA: Diagnosis not present

## 2016-09-06 DIAGNOSIS — D631 Anemia in chronic kidney disease: Secondary | ICD-10-CM | POA: Diagnosis not present

## 2016-09-06 DIAGNOSIS — Z992 Dependence on renal dialysis: Secondary | ICD-10-CM | POA: Diagnosis not present

## 2016-09-06 DIAGNOSIS — D509 Iron deficiency anemia, unspecified: Secondary | ICD-10-CM | POA: Diagnosis not present

## 2016-09-06 DIAGNOSIS — N186 End stage renal disease: Secondary | ICD-10-CM | POA: Diagnosis not present

## 2016-09-07 DIAGNOSIS — N186 End stage renal disease: Secondary | ICD-10-CM | POA: Diagnosis not present

## 2016-09-07 DIAGNOSIS — Z992 Dependence on renal dialysis: Secondary | ICD-10-CM | POA: Diagnosis not present

## 2016-09-07 DIAGNOSIS — N2581 Secondary hyperparathyroidism of renal origin: Secondary | ICD-10-CM | POA: Diagnosis not present

## 2016-09-07 DIAGNOSIS — D631 Anemia in chronic kidney disease: Secondary | ICD-10-CM | POA: Diagnosis not present

## 2016-09-07 DIAGNOSIS — D509 Iron deficiency anemia, unspecified: Secondary | ICD-10-CM | POA: Diagnosis not present

## 2016-09-08 DIAGNOSIS — D631 Anemia in chronic kidney disease: Secondary | ICD-10-CM | POA: Diagnosis not present

## 2016-09-08 DIAGNOSIS — Z992 Dependence on renal dialysis: Secondary | ICD-10-CM | POA: Diagnosis not present

## 2016-09-08 DIAGNOSIS — N186 End stage renal disease: Secondary | ICD-10-CM | POA: Diagnosis not present

## 2016-09-08 DIAGNOSIS — D509 Iron deficiency anemia, unspecified: Secondary | ICD-10-CM | POA: Diagnosis not present

## 2016-09-08 DIAGNOSIS — N2581 Secondary hyperparathyroidism of renal origin: Secondary | ICD-10-CM | POA: Diagnosis not present

## 2016-09-09 DIAGNOSIS — R05 Cough: Secondary | ICD-10-CM | POA: Diagnosis not present

## 2016-09-09 DIAGNOSIS — J069 Acute upper respiratory infection, unspecified: Secondary | ICD-10-CM | POA: Diagnosis not present

## 2016-09-09 DIAGNOSIS — N186 End stage renal disease: Secondary | ICD-10-CM | POA: Diagnosis not present

## 2016-09-09 DIAGNOSIS — N2581 Secondary hyperparathyroidism of renal origin: Secondary | ICD-10-CM | POA: Diagnosis not present

## 2016-09-09 DIAGNOSIS — D631 Anemia in chronic kidney disease: Secondary | ICD-10-CM | POA: Diagnosis not present

## 2016-09-09 DIAGNOSIS — Z992 Dependence on renal dialysis: Secondary | ICD-10-CM | POA: Diagnosis not present

## 2016-09-09 DIAGNOSIS — R600 Localized edema: Secondary | ICD-10-CM | POA: Diagnosis not present

## 2016-09-09 DIAGNOSIS — D509 Iron deficiency anemia, unspecified: Secondary | ICD-10-CM | POA: Diagnosis not present

## 2016-09-10 DIAGNOSIS — D509 Iron deficiency anemia, unspecified: Secondary | ICD-10-CM | POA: Diagnosis not present

## 2016-09-10 DIAGNOSIS — Z992 Dependence on renal dialysis: Secondary | ICD-10-CM | POA: Diagnosis not present

## 2016-09-10 DIAGNOSIS — N2581 Secondary hyperparathyroidism of renal origin: Secondary | ICD-10-CM | POA: Diagnosis not present

## 2016-09-10 DIAGNOSIS — D631 Anemia in chronic kidney disease: Secondary | ICD-10-CM | POA: Diagnosis not present

## 2016-09-10 DIAGNOSIS — N186 End stage renal disease: Secondary | ICD-10-CM | POA: Diagnosis not present

## 2016-09-11 DIAGNOSIS — N186 End stage renal disease: Secondary | ICD-10-CM | POA: Diagnosis not present

## 2016-09-11 DIAGNOSIS — D509 Iron deficiency anemia, unspecified: Secondary | ICD-10-CM | POA: Diagnosis not present

## 2016-09-11 DIAGNOSIS — N2581 Secondary hyperparathyroidism of renal origin: Secondary | ICD-10-CM | POA: Diagnosis not present

## 2016-09-11 DIAGNOSIS — Z992 Dependence on renal dialysis: Secondary | ICD-10-CM | POA: Diagnosis not present

## 2016-09-11 DIAGNOSIS — D631 Anemia in chronic kidney disease: Secondary | ICD-10-CM | POA: Diagnosis not present

## 2016-09-12 DIAGNOSIS — N2581 Secondary hyperparathyroidism of renal origin: Secondary | ICD-10-CM | POA: Diagnosis not present

## 2016-09-12 DIAGNOSIS — N186 End stage renal disease: Secondary | ICD-10-CM | POA: Diagnosis not present

## 2016-09-12 DIAGNOSIS — D509 Iron deficiency anemia, unspecified: Secondary | ICD-10-CM | POA: Diagnosis not present

## 2016-09-12 DIAGNOSIS — D631 Anemia in chronic kidney disease: Secondary | ICD-10-CM | POA: Diagnosis not present

## 2016-09-12 DIAGNOSIS — Z992 Dependence on renal dialysis: Secondary | ICD-10-CM | POA: Diagnosis not present

## 2016-09-13 DIAGNOSIS — Z992 Dependence on renal dialysis: Secondary | ICD-10-CM | POA: Diagnosis not present

## 2016-09-13 DIAGNOSIS — N2581 Secondary hyperparathyroidism of renal origin: Secondary | ICD-10-CM | POA: Diagnosis not present

## 2016-09-13 DIAGNOSIS — N186 End stage renal disease: Secondary | ICD-10-CM | POA: Diagnosis not present

## 2016-09-13 DIAGNOSIS — D631 Anemia in chronic kidney disease: Secondary | ICD-10-CM | POA: Diagnosis not present

## 2016-09-13 DIAGNOSIS — D509 Iron deficiency anemia, unspecified: Secondary | ICD-10-CM | POA: Diagnosis not present

## 2016-09-14 DIAGNOSIS — D631 Anemia in chronic kidney disease: Secondary | ICD-10-CM | POA: Diagnosis not present

## 2016-09-14 DIAGNOSIS — D509 Iron deficiency anemia, unspecified: Secondary | ICD-10-CM | POA: Diagnosis not present

## 2016-09-14 DIAGNOSIS — N186 End stage renal disease: Secondary | ICD-10-CM | POA: Diagnosis not present

## 2016-09-14 DIAGNOSIS — N2581 Secondary hyperparathyroidism of renal origin: Secondary | ICD-10-CM | POA: Diagnosis not present

## 2016-09-14 DIAGNOSIS — Z992 Dependence on renal dialysis: Secondary | ICD-10-CM | POA: Diagnosis not present

## 2016-09-15 DIAGNOSIS — Z992 Dependence on renal dialysis: Secondary | ICD-10-CM | POA: Diagnosis not present

## 2016-09-15 DIAGNOSIS — N186 End stage renal disease: Secondary | ICD-10-CM | POA: Diagnosis not present

## 2016-09-15 DIAGNOSIS — D631 Anemia in chronic kidney disease: Secondary | ICD-10-CM | POA: Diagnosis not present

## 2016-09-15 DIAGNOSIS — D509 Iron deficiency anemia, unspecified: Secondary | ICD-10-CM | POA: Diagnosis not present

## 2016-09-15 DIAGNOSIS — N2581 Secondary hyperparathyroidism of renal origin: Secondary | ICD-10-CM | POA: Diagnosis not present

## 2016-09-16 DIAGNOSIS — D631 Anemia in chronic kidney disease: Secondary | ICD-10-CM | POA: Diagnosis not present

## 2016-09-16 DIAGNOSIS — N186 End stage renal disease: Secondary | ICD-10-CM | POA: Diagnosis not present

## 2016-09-16 DIAGNOSIS — Z992 Dependence on renal dialysis: Secondary | ICD-10-CM | POA: Diagnosis not present

## 2016-09-16 DIAGNOSIS — N2581 Secondary hyperparathyroidism of renal origin: Secondary | ICD-10-CM | POA: Diagnosis not present

## 2016-09-16 DIAGNOSIS — D509 Iron deficiency anemia, unspecified: Secondary | ICD-10-CM | POA: Diagnosis not present

## 2016-09-17 DIAGNOSIS — D631 Anemia in chronic kidney disease: Secondary | ICD-10-CM | POA: Diagnosis not present

## 2016-09-17 DIAGNOSIS — D509 Iron deficiency anemia, unspecified: Secondary | ICD-10-CM | POA: Diagnosis not present

## 2016-09-17 DIAGNOSIS — N2581 Secondary hyperparathyroidism of renal origin: Secondary | ICD-10-CM | POA: Diagnosis not present

## 2016-09-17 DIAGNOSIS — Z992 Dependence on renal dialysis: Secondary | ICD-10-CM | POA: Diagnosis not present

## 2016-09-17 DIAGNOSIS — N186 End stage renal disease: Secondary | ICD-10-CM | POA: Diagnosis not present

## 2016-09-18 DIAGNOSIS — D631 Anemia in chronic kidney disease: Secondary | ICD-10-CM | POA: Diagnosis not present

## 2016-09-18 DIAGNOSIS — N186 End stage renal disease: Secondary | ICD-10-CM | POA: Diagnosis not present

## 2016-09-18 DIAGNOSIS — Z992 Dependence on renal dialysis: Secondary | ICD-10-CM | POA: Diagnosis not present

## 2016-09-18 DIAGNOSIS — N2581 Secondary hyperparathyroidism of renal origin: Secondary | ICD-10-CM | POA: Diagnosis not present

## 2016-09-18 DIAGNOSIS — D509 Iron deficiency anemia, unspecified: Secondary | ICD-10-CM | POA: Diagnosis not present

## 2016-09-19 DIAGNOSIS — D509 Iron deficiency anemia, unspecified: Secondary | ICD-10-CM | POA: Diagnosis not present

## 2016-09-19 DIAGNOSIS — D631 Anemia in chronic kidney disease: Secondary | ICD-10-CM | POA: Diagnosis not present

## 2016-09-19 DIAGNOSIS — Z992 Dependence on renal dialysis: Secondary | ICD-10-CM | POA: Diagnosis not present

## 2016-09-19 DIAGNOSIS — N2581 Secondary hyperparathyroidism of renal origin: Secondary | ICD-10-CM | POA: Diagnosis not present

## 2016-09-19 DIAGNOSIS — N186 End stage renal disease: Secondary | ICD-10-CM | POA: Diagnosis not present

## 2016-09-20 DIAGNOSIS — N2581 Secondary hyperparathyroidism of renal origin: Secondary | ICD-10-CM | POA: Diagnosis not present

## 2016-09-20 DIAGNOSIS — D631 Anemia in chronic kidney disease: Secondary | ICD-10-CM | POA: Diagnosis not present

## 2016-09-20 DIAGNOSIS — N186 End stage renal disease: Secondary | ICD-10-CM | POA: Diagnosis not present

## 2016-09-20 DIAGNOSIS — D509 Iron deficiency anemia, unspecified: Secondary | ICD-10-CM | POA: Diagnosis not present

## 2016-09-20 DIAGNOSIS — Z992 Dependence on renal dialysis: Secondary | ICD-10-CM | POA: Diagnosis not present

## 2016-09-21 DIAGNOSIS — D631 Anemia in chronic kidney disease: Secondary | ICD-10-CM | POA: Diagnosis not present

## 2016-09-21 DIAGNOSIS — N186 End stage renal disease: Secondary | ICD-10-CM | POA: Diagnosis not present

## 2016-09-21 DIAGNOSIS — N2581 Secondary hyperparathyroidism of renal origin: Secondary | ICD-10-CM | POA: Diagnosis not present

## 2016-09-21 DIAGNOSIS — D509 Iron deficiency anemia, unspecified: Secondary | ICD-10-CM | POA: Diagnosis not present

## 2016-09-21 DIAGNOSIS — Z992 Dependence on renal dialysis: Secondary | ICD-10-CM | POA: Diagnosis not present

## 2016-09-22 DIAGNOSIS — Z992 Dependence on renal dialysis: Secondary | ICD-10-CM | POA: Diagnosis not present

## 2016-09-22 DIAGNOSIS — N2581 Secondary hyperparathyroidism of renal origin: Secondary | ICD-10-CM | POA: Diagnosis not present

## 2016-09-22 DIAGNOSIS — N186 End stage renal disease: Secondary | ICD-10-CM | POA: Diagnosis not present

## 2016-09-22 DIAGNOSIS — D509 Iron deficiency anemia, unspecified: Secondary | ICD-10-CM | POA: Diagnosis not present

## 2016-09-22 DIAGNOSIS — D631 Anemia in chronic kidney disease: Secondary | ICD-10-CM | POA: Diagnosis not present

## 2016-09-23 DIAGNOSIS — N186 End stage renal disease: Secondary | ICD-10-CM | POA: Diagnosis not present

## 2016-09-23 DIAGNOSIS — N2581 Secondary hyperparathyroidism of renal origin: Secondary | ICD-10-CM | POA: Diagnosis not present

## 2016-09-23 DIAGNOSIS — D631 Anemia in chronic kidney disease: Secondary | ICD-10-CM | POA: Diagnosis not present

## 2016-09-23 DIAGNOSIS — D509 Iron deficiency anemia, unspecified: Secondary | ICD-10-CM | POA: Diagnosis not present

## 2016-09-23 DIAGNOSIS — Z992 Dependence on renal dialysis: Secondary | ICD-10-CM | POA: Diagnosis not present

## 2016-09-24 DIAGNOSIS — D631 Anemia in chronic kidney disease: Secondary | ICD-10-CM | POA: Diagnosis not present

## 2016-09-24 DIAGNOSIS — D509 Iron deficiency anemia, unspecified: Secondary | ICD-10-CM | POA: Diagnosis not present

## 2016-09-24 DIAGNOSIS — N2581 Secondary hyperparathyroidism of renal origin: Secondary | ICD-10-CM | POA: Diagnosis not present

## 2016-09-24 DIAGNOSIS — Z992 Dependence on renal dialysis: Secondary | ICD-10-CM | POA: Diagnosis not present

## 2016-09-24 DIAGNOSIS — N186 End stage renal disease: Secondary | ICD-10-CM | POA: Diagnosis not present

## 2016-09-25 DIAGNOSIS — N2581 Secondary hyperparathyroidism of renal origin: Secondary | ICD-10-CM | POA: Diagnosis not present

## 2016-09-25 DIAGNOSIS — D631 Anemia in chronic kidney disease: Secondary | ICD-10-CM | POA: Diagnosis not present

## 2016-09-25 DIAGNOSIS — N186 End stage renal disease: Secondary | ICD-10-CM | POA: Diagnosis not present

## 2016-09-25 DIAGNOSIS — D509 Iron deficiency anemia, unspecified: Secondary | ICD-10-CM | POA: Diagnosis not present

## 2016-09-25 DIAGNOSIS — Z992 Dependence on renal dialysis: Secondary | ICD-10-CM | POA: Diagnosis not present

## 2016-09-25 DIAGNOSIS — E559 Vitamin D deficiency, unspecified: Secondary | ICD-10-CM | POA: Diagnosis not present

## 2016-09-26 DIAGNOSIS — D631 Anemia in chronic kidney disease: Secondary | ICD-10-CM | POA: Diagnosis not present

## 2016-09-26 DIAGNOSIS — E119 Type 2 diabetes mellitus without complications: Secondary | ICD-10-CM | POA: Diagnosis not present

## 2016-09-26 DIAGNOSIS — N186 End stage renal disease: Secondary | ICD-10-CM | POA: Diagnosis not present

## 2016-09-26 DIAGNOSIS — Z794 Long term (current) use of insulin: Secondary | ICD-10-CM | POA: Diagnosis not present

## 2016-09-26 DIAGNOSIS — N2581 Secondary hyperparathyroidism of renal origin: Secondary | ICD-10-CM | POA: Diagnosis not present

## 2016-09-26 DIAGNOSIS — Z992 Dependence on renal dialysis: Secondary | ICD-10-CM | POA: Diagnosis not present

## 2016-09-26 DIAGNOSIS — D509 Iron deficiency anemia, unspecified: Secondary | ICD-10-CM | POA: Diagnosis not present

## 2016-09-26 DIAGNOSIS — E559 Vitamin D deficiency, unspecified: Secondary | ICD-10-CM | POA: Diagnosis not present

## 2016-09-27 DIAGNOSIS — D509 Iron deficiency anemia, unspecified: Secondary | ICD-10-CM | POA: Diagnosis not present

## 2016-09-27 DIAGNOSIS — Z992 Dependence on renal dialysis: Secondary | ICD-10-CM | POA: Diagnosis not present

## 2016-09-27 DIAGNOSIS — N186 End stage renal disease: Secondary | ICD-10-CM | POA: Diagnosis not present

## 2016-09-27 DIAGNOSIS — N2581 Secondary hyperparathyroidism of renal origin: Secondary | ICD-10-CM | POA: Diagnosis not present

## 2016-09-27 DIAGNOSIS — D631 Anemia in chronic kidney disease: Secondary | ICD-10-CM | POA: Diagnosis not present

## 2016-09-27 DIAGNOSIS — E559 Vitamin D deficiency, unspecified: Secondary | ICD-10-CM | POA: Diagnosis not present

## 2016-09-28 DIAGNOSIS — N2581 Secondary hyperparathyroidism of renal origin: Secondary | ICD-10-CM | POA: Diagnosis not present

## 2016-09-28 DIAGNOSIS — D509 Iron deficiency anemia, unspecified: Secondary | ICD-10-CM | POA: Diagnosis not present

## 2016-09-28 DIAGNOSIS — Z992 Dependence on renal dialysis: Secondary | ICD-10-CM | POA: Diagnosis not present

## 2016-09-28 DIAGNOSIS — N186 End stage renal disease: Secondary | ICD-10-CM | POA: Diagnosis not present

## 2016-09-28 DIAGNOSIS — E559 Vitamin D deficiency, unspecified: Secondary | ICD-10-CM | POA: Diagnosis not present

## 2016-09-28 DIAGNOSIS — D631 Anemia in chronic kidney disease: Secondary | ICD-10-CM | POA: Diagnosis not present

## 2016-09-29 DIAGNOSIS — D509 Iron deficiency anemia, unspecified: Secondary | ICD-10-CM | POA: Diagnosis not present

## 2016-09-29 DIAGNOSIS — N2581 Secondary hyperparathyroidism of renal origin: Secondary | ICD-10-CM | POA: Diagnosis not present

## 2016-09-29 DIAGNOSIS — E559 Vitamin D deficiency, unspecified: Secondary | ICD-10-CM | POA: Diagnosis not present

## 2016-09-29 DIAGNOSIS — Z992 Dependence on renal dialysis: Secondary | ICD-10-CM | POA: Diagnosis not present

## 2016-09-29 DIAGNOSIS — N186 End stage renal disease: Secondary | ICD-10-CM | POA: Diagnosis not present

## 2016-09-29 DIAGNOSIS — D631 Anemia in chronic kidney disease: Secondary | ICD-10-CM | POA: Diagnosis not present

## 2016-09-30 DIAGNOSIS — N186 End stage renal disease: Secondary | ICD-10-CM | POA: Diagnosis not present

## 2016-09-30 DIAGNOSIS — N2581 Secondary hyperparathyroidism of renal origin: Secondary | ICD-10-CM | POA: Diagnosis not present

## 2016-09-30 DIAGNOSIS — Z992 Dependence on renal dialysis: Secondary | ICD-10-CM | POA: Diagnosis not present

## 2016-09-30 DIAGNOSIS — E559 Vitamin D deficiency, unspecified: Secondary | ICD-10-CM | POA: Diagnosis not present

## 2016-09-30 DIAGNOSIS — D509 Iron deficiency anemia, unspecified: Secondary | ICD-10-CM | POA: Diagnosis not present

## 2016-09-30 DIAGNOSIS — D631 Anemia in chronic kidney disease: Secondary | ICD-10-CM | POA: Diagnosis not present

## 2016-10-01 DIAGNOSIS — E559 Vitamin D deficiency, unspecified: Secondary | ICD-10-CM | POA: Diagnosis not present

## 2016-10-01 DIAGNOSIS — N2581 Secondary hyperparathyroidism of renal origin: Secondary | ICD-10-CM | POA: Diagnosis not present

## 2016-10-01 DIAGNOSIS — Z992 Dependence on renal dialysis: Secondary | ICD-10-CM | POA: Diagnosis not present

## 2016-10-01 DIAGNOSIS — D509 Iron deficiency anemia, unspecified: Secondary | ICD-10-CM | POA: Diagnosis not present

## 2016-10-01 DIAGNOSIS — N186 End stage renal disease: Secondary | ICD-10-CM | POA: Diagnosis not present

## 2016-10-01 DIAGNOSIS — D631 Anemia in chronic kidney disease: Secondary | ICD-10-CM | POA: Diagnosis not present

## 2016-10-02 DIAGNOSIS — E559 Vitamin D deficiency, unspecified: Secondary | ICD-10-CM | POA: Diagnosis not present

## 2016-10-02 DIAGNOSIS — N186 End stage renal disease: Secondary | ICD-10-CM | POA: Diagnosis not present

## 2016-10-02 DIAGNOSIS — Z992 Dependence on renal dialysis: Secondary | ICD-10-CM | POA: Diagnosis not present

## 2016-10-02 DIAGNOSIS — D509 Iron deficiency anemia, unspecified: Secondary | ICD-10-CM | POA: Diagnosis not present

## 2016-10-02 DIAGNOSIS — D631 Anemia in chronic kidney disease: Secondary | ICD-10-CM | POA: Diagnosis not present

## 2016-10-02 DIAGNOSIS — N2581 Secondary hyperparathyroidism of renal origin: Secondary | ICD-10-CM | POA: Diagnosis not present

## 2016-10-03 DIAGNOSIS — N186 End stage renal disease: Secondary | ICD-10-CM | POA: Diagnosis not present

## 2016-10-03 DIAGNOSIS — Z992 Dependence on renal dialysis: Secondary | ICD-10-CM | POA: Diagnosis not present

## 2016-10-03 DIAGNOSIS — D509 Iron deficiency anemia, unspecified: Secondary | ICD-10-CM | POA: Diagnosis not present

## 2016-10-03 DIAGNOSIS — E559 Vitamin D deficiency, unspecified: Secondary | ICD-10-CM | POA: Diagnosis not present

## 2016-10-03 DIAGNOSIS — D631 Anemia in chronic kidney disease: Secondary | ICD-10-CM | POA: Diagnosis not present

## 2016-10-03 DIAGNOSIS — N2581 Secondary hyperparathyroidism of renal origin: Secondary | ICD-10-CM | POA: Diagnosis not present

## 2016-10-04 DIAGNOSIS — Z992 Dependence on renal dialysis: Secondary | ICD-10-CM | POA: Diagnosis not present

## 2016-10-04 DIAGNOSIS — N2581 Secondary hyperparathyroidism of renal origin: Secondary | ICD-10-CM | POA: Diagnosis not present

## 2016-10-04 DIAGNOSIS — N186 End stage renal disease: Secondary | ICD-10-CM | POA: Diagnosis not present

## 2016-10-04 DIAGNOSIS — E559 Vitamin D deficiency, unspecified: Secondary | ICD-10-CM | POA: Diagnosis not present

## 2016-10-04 DIAGNOSIS — D509 Iron deficiency anemia, unspecified: Secondary | ICD-10-CM | POA: Diagnosis not present

## 2016-10-04 DIAGNOSIS — D631 Anemia in chronic kidney disease: Secondary | ICD-10-CM | POA: Diagnosis not present

## 2016-10-05 DIAGNOSIS — N186 End stage renal disease: Secondary | ICD-10-CM | POA: Diagnosis not present

## 2016-10-05 DIAGNOSIS — E559 Vitamin D deficiency, unspecified: Secondary | ICD-10-CM | POA: Diagnosis not present

## 2016-10-05 DIAGNOSIS — D509 Iron deficiency anemia, unspecified: Secondary | ICD-10-CM | POA: Diagnosis not present

## 2016-10-05 DIAGNOSIS — D631 Anemia in chronic kidney disease: Secondary | ICD-10-CM | POA: Diagnosis not present

## 2016-10-05 DIAGNOSIS — Z992 Dependence on renal dialysis: Secondary | ICD-10-CM | POA: Diagnosis not present

## 2016-10-05 DIAGNOSIS — N2581 Secondary hyperparathyroidism of renal origin: Secondary | ICD-10-CM | POA: Diagnosis not present

## 2016-10-06 DIAGNOSIS — D509 Iron deficiency anemia, unspecified: Secondary | ICD-10-CM | POA: Diagnosis not present

## 2016-10-06 DIAGNOSIS — E559 Vitamin D deficiency, unspecified: Secondary | ICD-10-CM | POA: Diagnosis not present

## 2016-10-06 DIAGNOSIS — N2581 Secondary hyperparathyroidism of renal origin: Secondary | ICD-10-CM | POA: Diagnosis not present

## 2016-10-06 DIAGNOSIS — Z992 Dependence on renal dialysis: Secondary | ICD-10-CM | POA: Diagnosis not present

## 2016-10-06 DIAGNOSIS — N186 End stage renal disease: Secondary | ICD-10-CM | POA: Diagnosis not present

## 2016-10-06 DIAGNOSIS — D631 Anemia in chronic kidney disease: Secondary | ICD-10-CM | POA: Diagnosis not present

## 2016-10-07 DIAGNOSIS — N2581 Secondary hyperparathyroidism of renal origin: Secondary | ICD-10-CM | POA: Diagnosis not present

## 2016-10-07 DIAGNOSIS — E559 Vitamin D deficiency, unspecified: Secondary | ICD-10-CM | POA: Diagnosis not present

## 2016-10-07 DIAGNOSIS — D509 Iron deficiency anemia, unspecified: Secondary | ICD-10-CM | POA: Diagnosis not present

## 2016-10-07 DIAGNOSIS — D631 Anemia in chronic kidney disease: Secondary | ICD-10-CM | POA: Diagnosis not present

## 2016-10-07 DIAGNOSIS — N186 End stage renal disease: Secondary | ICD-10-CM | POA: Diagnosis not present

## 2016-10-07 DIAGNOSIS — Z992 Dependence on renal dialysis: Secondary | ICD-10-CM | POA: Diagnosis not present

## 2016-10-08 DIAGNOSIS — D631 Anemia in chronic kidney disease: Secondary | ICD-10-CM | POA: Diagnosis not present

## 2016-10-08 DIAGNOSIS — Z992 Dependence on renal dialysis: Secondary | ICD-10-CM | POA: Diagnosis not present

## 2016-10-08 DIAGNOSIS — N186 End stage renal disease: Secondary | ICD-10-CM | POA: Diagnosis not present

## 2016-10-08 DIAGNOSIS — N2581 Secondary hyperparathyroidism of renal origin: Secondary | ICD-10-CM | POA: Diagnosis not present

## 2016-10-08 DIAGNOSIS — D509 Iron deficiency anemia, unspecified: Secondary | ICD-10-CM | POA: Diagnosis not present

## 2016-10-08 DIAGNOSIS — E559 Vitamin D deficiency, unspecified: Secondary | ICD-10-CM | POA: Diagnosis not present

## 2016-10-09 DIAGNOSIS — D509 Iron deficiency anemia, unspecified: Secondary | ICD-10-CM | POA: Diagnosis not present

## 2016-10-09 DIAGNOSIS — N2581 Secondary hyperparathyroidism of renal origin: Secondary | ICD-10-CM | POA: Diagnosis not present

## 2016-10-09 DIAGNOSIS — N186 End stage renal disease: Secondary | ICD-10-CM | POA: Diagnosis not present

## 2016-10-09 DIAGNOSIS — E559 Vitamin D deficiency, unspecified: Secondary | ICD-10-CM | POA: Diagnosis not present

## 2016-10-09 DIAGNOSIS — Z992 Dependence on renal dialysis: Secondary | ICD-10-CM | POA: Diagnosis not present

## 2016-10-09 DIAGNOSIS — D631 Anemia in chronic kidney disease: Secondary | ICD-10-CM | POA: Diagnosis not present

## 2016-10-10 DIAGNOSIS — N186 End stage renal disease: Secondary | ICD-10-CM | POA: Diagnosis not present

## 2016-10-10 DIAGNOSIS — Z992 Dependence on renal dialysis: Secondary | ICD-10-CM | POA: Diagnosis not present

## 2016-10-10 DIAGNOSIS — D631 Anemia in chronic kidney disease: Secondary | ICD-10-CM | POA: Diagnosis not present

## 2016-10-10 DIAGNOSIS — E559 Vitamin D deficiency, unspecified: Secondary | ICD-10-CM | POA: Diagnosis not present

## 2016-10-10 DIAGNOSIS — D509 Iron deficiency anemia, unspecified: Secondary | ICD-10-CM | POA: Diagnosis not present

## 2016-10-10 DIAGNOSIS — N2581 Secondary hyperparathyroidism of renal origin: Secondary | ICD-10-CM | POA: Diagnosis not present

## 2016-10-11 DIAGNOSIS — D509 Iron deficiency anemia, unspecified: Secondary | ICD-10-CM | POA: Diagnosis not present

## 2016-10-11 DIAGNOSIS — N186 End stage renal disease: Secondary | ICD-10-CM | POA: Diagnosis not present

## 2016-10-11 DIAGNOSIS — D631 Anemia in chronic kidney disease: Secondary | ICD-10-CM | POA: Diagnosis not present

## 2016-10-11 DIAGNOSIS — N2581 Secondary hyperparathyroidism of renal origin: Secondary | ICD-10-CM | POA: Diagnosis not present

## 2016-10-11 DIAGNOSIS — E559 Vitamin D deficiency, unspecified: Secondary | ICD-10-CM | POA: Diagnosis not present

## 2016-10-11 DIAGNOSIS — Z992 Dependence on renal dialysis: Secondary | ICD-10-CM | POA: Diagnosis not present

## 2016-10-12 DIAGNOSIS — N186 End stage renal disease: Secondary | ICD-10-CM | POA: Diagnosis not present

## 2016-10-12 DIAGNOSIS — D631 Anemia in chronic kidney disease: Secondary | ICD-10-CM | POA: Diagnosis not present

## 2016-10-12 DIAGNOSIS — N2581 Secondary hyperparathyroidism of renal origin: Secondary | ICD-10-CM | POA: Diagnosis not present

## 2016-10-12 DIAGNOSIS — D509 Iron deficiency anemia, unspecified: Secondary | ICD-10-CM | POA: Diagnosis not present

## 2016-10-12 DIAGNOSIS — Z992 Dependence on renal dialysis: Secondary | ICD-10-CM | POA: Diagnosis not present

## 2016-10-12 DIAGNOSIS — E559 Vitamin D deficiency, unspecified: Secondary | ICD-10-CM | POA: Diagnosis not present

## 2016-10-13 DIAGNOSIS — Z992 Dependence on renal dialysis: Secondary | ICD-10-CM | POA: Diagnosis not present

## 2016-10-13 DIAGNOSIS — D631 Anemia in chronic kidney disease: Secondary | ICD-10-CM | POA: Diagnosis not present

## 2016-10-13 DIAGNOSIS — N2581 Secondary hyperparathyroidism of renal origin: Secondary | ICD-10-CM | POA: Diagnosis not present

## 2016-10-13 DIAGNOSIS — D509 Iron deficiency anemia, unspecified: Secondary | ICD-10-CM | POA: Diagnosis not present

## 2016-10-13 DIAGNOSIS — E559 Vitamin D deficiency, unspecified: Secondary | ICD-10-CM | POA: Diagnosis not present

## 2016-10-13 DIAGNOSIS — N186 End stage renal disease: Secondary | ICD-10-CM | POA: Diagnosis not present

## 2016-10-14 DIAGNOSIS — N186 End stage renal disease: Secondary | ICD-10-CM | POA: Diagnosis not present

## 2016-10-14 DIAGNOSIS — D509 Iron deficiency anemia, unspecified: Secondary | ICD-10-CM | POA: Diagnosis not present

## 2016-10-14 DIAGNOSIS — N2581 Secondary hyperparathyroidism of renal origin: Secondary | ICD-10-CM | POA: Diagnosis not present

## 2016-10-14 DIAGNOSIS — Z992 Dependence on renal dialysis: Secondary | ICD-10-CM | POA: Diagnosis not present

## 2016-10-14 DIAGNOSIS — E559 Vitamin D deficiency, unspecified: Secondary | ICD-10-CM | POA: Diagnosis not present

## 2016-10-14 DIAGNOSIS — D631 Anemia in chronic kidney disease: Secondary | ICD-10-CM | POA: Diagnosis not present

## 2016-10-15 DIAGNOSIS — E559 Vitamin D deficiency, unspecified: Secondary | ICD-10-CM | POA: Diagnosis not present

## 2016-10-15 DIAGNOSIS — N2581 Secondary hyperparathyroidism of renal origin: Secondary | ICD-10-CM | POA: Diagnosis not present

## 2016-10-15 DIAGNOSIS — Z992 Dependence on renal dialysis: Secondary | ICD-10-CM | POA: Diagnosis not present

## 2016-10-15 DIAGNOSIS — N186 End stage renal disease: Secondary | ICD-10-CM | POA: Diagnosis not present

## 2016-10-15 DIAGNOSIS — D631 Anemia in chronic kidney disease: Secondary | ICD-10-CM | POA: Diagnosis not present

## 2016-10-15 DIAGNOSIS — D509 Iron deficiency anemia, unspecified: Secondary | ICD-10-CM | POA: Diagnosis not present

## 2016-10-16 DIAGNOSIS — N2581 Secondary hyperparathyroidism of renal origin: Secondary | ICD-10-CM | POA: Diagnosis not present

## 2016-10-16 DIAGNOSIS — D631 Anemia in chronic kidney disease: Secondary | ICD-10-CM | POA: Diagnosis not present

## 2016-10-16 DIAGNOSIS — D509 Iron deficiency anemia, unspecified: Secondary | ICD-10-CM | POA: Diagnosis not present

## 2016-10-16 DIAGNOSIS — Z992 Dependence on renal dialysis: Secondary | ICD-10-CM | POA: Diagnosis not present

## 2016-10-16 DIAGNOSIS — E559 Vitamin D deficiency, unspecified: Secondary | ICD-10-CM | POA: Diagnosis not present

## 2016-10-16 DIAGNOSIS — N186 End stage renal disease: Secondary | ICD-10-CM | POA: Diagnosis not present

## 2016-10-17 DIAGNOSIS — D509 Iron deficiency anemia, unspecified: Secondary | ICD-10-CM | POA: Diagnosis not present

## 2016-10-17 DIAGNOSIS — Z992 Dependence on renal dialysis: Secondary | ICD-10-CM | POA: Diagnosis not present

## 2016-10-17 DIAGNOSIS — N186 End stage renal disease: Secondary | ICD-10-CM | POA: Diagnosis not present

## 2016-10-17 DIAGNOSIS — D631 Anemia in chronic kidney disease: Secondary | ICD-10-CM | POA: Diagnosis not present

## 2016-10-17 DIAGNOSIS — N2581 Secondary hyperparathyroidism of renal origin: Secondary | ICD-10-CM | POA: Diagnosis not present

## 2016-10-17 DIAGNOSIS — E559 Vitamin D deficiency, unspecified: Secondary | ICD-10-CM | POA: Diagnosis not present

## 2016-10-18 DIAGNOSIS — N2581 Secondary hyperparathyroidism of renal origin: Secondary | ICD-10-CM | POA: Diagnosis not present

## 2016-10-18 DIAGNOSIS — E559 Vitamin D deficiency, unspecified: Secondary | ICD-10-CM | POA: Diagnosis not present

## 2016-10-18 DIAGNOSIS — Z992 Dependence on renal dialysis: Secondary | ICD-10-CM | POA: Diagnosis not present

## 2016-10-18 DIAGNOSIS — D631 Anemia in chronic kidney disease: Secondary | ICD-10-CM | POA: Diagnosis not present

## 2016-10-18 DIAGNOSIS — N186 End stage renal disease: Secondary | ICD-10-CM | POA: Diagnosis not present

## 2016-10-18 DIAGNOSIS — D509 Iron deficiency anemia, unspecified: Secondary | ICD-10-CM | POA: Diagnosis not present

## 2016-10-19 DIAGNOSIS — D631 Anemia in chronic kidney disease: Secondary | ICD-10-CM | POA: Diagnosis not present

## 2016-10-19 DIAGNOSIS — D509 Iron deficiency anemia, unspecified: Secondary | ICD-10-CM | POA: Diagnosis not present

## 2016-10-19 DIAGNOSIS — N2581 Secondary hyperparathyroidism of renal origin: Secondary | ICD-10-CM | POA: Diagnosis not present

## 2016-10-19 DIAGNOSIS — E559 Vitamin D deficiency, unspecified: Secondary | ICD-10-CM | POA: Diagnosis not present

## 2016-10-19 DIAGNOSIS — N186 End stage renal disease: Secondary | ICD-10-CM | POA: Diagnosis not present

## 2016-10-19 DIAGNOSIS — Z992 Dependence on renal dialysis: Secondary | ICD-10-CM | POA: Diagnosis not present

## 2016-10-20 DIAGNOSIS — D631 Anemia in chronic kidney disease: Secondary | ICD-10-CM | POA: Diagnosis not present

## 2016-10-20 DIAGNOSIS — N186 End stage renal disease: Secondary | ICD-10-CM | POA: Diagnosis not present

## 2016-10-20 DIAGNOSIS — D509 Iron deficiency anemia, unspecified: Secondary | ICD-10-CM | POA: Diagnosis not present

## 2016-10-20 DIAGNOSIS — N2581 Secondary hyperparathyroidism of renal origin: Secondary | ICD-10-CM | POA: Diagnosis not present

## 2016-10-20 DIAGNOSIS — E559 Vitamin D deficiency, unspecified: Secondary | ICD-10-CM | POA: Diagnosis not present

## 2016-10-20 DIAGNOSIS — Z992 Dependence on renal dialysis: Secondary | ICD-10-CM | POA: Diagnosis not present

## 2016-10-21 DIAGNOSIS — N186 End stage renal disease: Secondary | ICD-10-CM | POA: Diagnosis not present

## 2016-10-21 DIAGNOSIS — N2581 Secondary hyperparathyroidism of renal origin: Secondary | ICD-10-CM | POA: Diagnosis not present

## 2016-10-21 DIAGNOSIS — Z992 Dependence on renal dialysis: Secondary | ICD-10-CM | POA: Diagnosis not present

## 2016-10-21 DIAGNOSIS — D509 Iron deficiency anemia, unspecified: Secondary | ICD-10-CM | POA: Diagnosis not present

## 2016-10-21 DIAGNOSIS — E559 Vitamin D deficiency, unspecified: Secondary | ICD-10-CM | POA: Diagnosis not present

## 2016-10-21 DIAGNOSIS — D631 Anemia in chronic kidney disease: Secondary | ICD-10-CM | POA: Diagnosis not present

## 2016-10-22 DIAGNOSIS — E559 Vitamin D deficiency, unspecified: Secondary | ICD-10-CM | POA: Diagnosis not present

## 2016-10-22 DIAGNOSIS — N186 End stage renal disease: Secondary | ICD-10-CM | POA: Diagnosis not present

## 2016-10-22 DIAGNOSIS — D631 Anemia in chronic kidney disease: Secondary | ICD-10-CM | POA: Diagnosis not present

## 2016-10-22 DIAGNOSIS — Z992 Dependence on renal dialysis: Secondary | ICD-10-CM | POA: Diagnosis not present

## 2016-10-22 DIAGNOSIS — N2581 Secondary hyperparathyroidism of renal origin: Secondary | ICD-10-CM | POA: Diagnosis not present

## 2016-10-22 DIAGNOSIS — D509 Iron deficiency anemia, unspecified: Secondary | ICD-10-CM | POA: Diagnosis not present

## 2016-10-23 DIAGNOSIS — D631 Anemia in chronic kidney disease: Secondary | ICD-10-CM | POA: Diagnosis not present

## 2016-10-23 DIAGNOSIS — N2581 Secondary hyperparathyroidism of renal origin: Secondary | ICD-10-CM | POA: Diagnosis not present

## 2016-10-23 DIAGNOSIS — N186 End stage renal disease: Secondary | ICD-10-CM | POA: Diagnosis not present

## 2016-10-23 DIAGNOSIS — E559 Vitamin D deficiency, unspecified: Secondary | ICD-10-CM | POA: Diagnosis not present

## 2016-10-23 DIAGNOSIS — Z992 Dependence on renal dialysis: Secondary | ICD-10-CM | POA: Diagnosis not present

## 2016-10-23 DIAGNOSIS — D509 Iron deficiency anemia, unspecified: Secondary | ICD-10-CM | POA: Diagnosis not present

## 2016-10-24 DIAGNOSIS — D631 Anemia in chronic kidney disease: Secondary | ICD-10-CM | POA: Diagnosis not present

## 2016-10-24 DIAGNOSIS — N2581 Secondary hyperparathyroidism of renal origin: Secondary | ICD-10-CM | POA: Diagnosis not present

## 2016-10-24 DIAGNOSIS — E559 Vitamin D deficiency, unspecified: Secondary | ICD-10-CM | POA: Diagnosis not present

## 2016-10-24 DIAGNOSIS — N186 End stage renal disease: Secondary | ICD-10-CM | POA: Diagnosis not present

## 2016-10-24 DIAGNOSIS — Z992 Dependence on renal dialysis: Secondary | ICD-10-CM | POA: Diagnosis not present

## 2016-10-24 DIAGNOSIS — D509 Iron deficiency anemia, unspecified: Secondary | ICD-10-CM | POA: Diagnosis not present

## 2016-10-25 DIAGNOSIS — D509 Iron deficiency anemia, unspecified: Secondary | ICD-10-CM | POA: Diagnosis not present

## 2016-10-25 DIAGNOSIS — N186 End stage renal disease: Secondary | ICD-10-CM | POA: Diagnosis not present

## 2016-10-25 DIAGNOSIS — N2581 Secondary hyperparathyroidism of renal origin: Secondary | ICD-10-CM | POA: Diagnosis not present

## 2016-10-25 DIAGNOSIS — Z992 Dependence on renal dialysis: Secondary | ICD-10-CM | POA: Diagnosis not present

## 2016-10-25 DIAGNOSIS — E559 Vitamin D deficiency, unspecified: Secondary | ICD-10-CM | POA: Diagnosis not present

## 2016-10-25 DIAGNOSIS — D631 Anemia in chronic kidney disease: Secondary | ICD-10-CM | POA: Diagnosis not present

## 2016-10-26 DIAGNOSIS — D509 Iron deficiency anemia, unspecified: Secondary | ICD-10-CM | POA: Diagnosis not present

## 2016-10-26 DIAGNOSIS — N2581 Secondary hyperparathyroidism of renal origin: Secondary | ICD-10-CM | POA: Diagnosis not present

## 2016-10-26 DIAGNOSIS — D631 Anemia in chronic kidney disease: Secondary | ICD-10-CM | POA: Diagnosis not present

## 2016-10-26 DIAGNOSIS — Z992 Dependence on renal dialysis: Secondary | ICD-10-CM | POA: Diagnosis not present

## 2016-10-26 DIAGNOSIS — N186 End stage renal disease: Secondary | ICD-10-CM | POA: Diagnosis not present

## 2016-10-27 DIAGNOSIS — N2581 Secondary hyperparathyroidism of renal origin: Secondary | ICD-10-CM | POA: Diagnosis not present

## 2016-10-27 DIAGNOSIS — Z992 Dependence on renal dialysis: Secondary | ICD-10-CM | POA: Diagnosis not present

## 2016-10-27 DIAGNOSIS — N186 End stage renal disease: Secondary | ICD-10-CM | POA: Diagnosis not present

## 2016-10-27 DIAGNOSIS — D631 Anemia in chronic kidney disease: Secondary | ICD-10-CM | POA: Diagnosis not present

## 2016-10-27 DIAGNOSIS — D509 Iron deficiency anemia, unspecified: Secondary | ICD-10-CM | POA: Diagnosis not present

## 2016-10-28 DIAGNOSIS — D509 Iron deficiency anemia, unspecified: Secondary | ICD-10-CM | POA: Diagnosis not present

## 2016-10-28 DIAGNOSIS — N186 End stage renal disease: Secondary | ICD-10-CM | POA: Diagnosis not present

## 2016-10-28 DIAGNOSIS — N2581 Secondary hyperparathyroidism of renal origin: Secondary | ICD-10-CM | POA: Diagnosis not present

## 2016-10-28 DIAGNOSIS — Z992 Dependence on renal dialysis: Secondary | ICD-10-CM | POA: Diagnosis not present

## 2016-10-28 DIAGNOSIS — D631 Anemia in chronic kidney disease: Secondary | ICD-10-CM | POA: Diagnosis not present

## 2016-10-29 DIAGNOSIS — D509 Iron deficiency anemia, unspecified: Secondary | ICD-10-CM | POA: Diagnosis not present

## 2016-10-29 DIAGNOSIS — Z992 Dependence on renal dialysis: Secondary | ICD-10-CM | POA: Diagnosis not present

## 2016-10-29 DIAGNOSIS — N186 End stage renal disease: Secondary | ICD-10-CM | POA: Diagnosis not present

## 2016-10-29 DIAGNOSIS — N2581 Secondary hyperparathyroidism of renal origin: Secondary | ICD-10-CM | POA: Diagnosis not present

## 2016-10-29 DIAGNOSIS — D631 Anemia in chronic kidney disease: Secondary | ICD-10-CM | POA: Diagnosis not present

## 2016-10-30 DIAGNOSIS — N186 End stage renal disease: Secondary | ICD-10-CM | POA: Diagnosis not present

## 2016-10-30 DIAGNOSIS — N2581 Secondary hyperparathyroidism of renal origin: Secondary | ICD-10-CM | POA: Diagnosis not present

## 2016-10-30 DIAGNOSIS — D631 Anemia in chronic kidney disease: Secondary | ICD-10-CM | POA: Diagnosis not present

## 2016-10-30 DIAGNOSIS — D509 Iron deficiency anemia, unspecified: Secondary | ICD-10-CM | POA: Diagnosis not present

## 2016-10-30 DIAGNOSIS — Z992 Dependence on renal dialysis: Secondary | ICD-10-CM | POA: Diagnosis not present

## 2016-10-31 DIAGNOSIS — N2581 Secondary hyperparathyroidism of renal origin: Secondary | ICD-10-CM | POA: Diagnosis not present

## 2016-10-31 DIAGNOSIS — Z992 Dependence on renal dialysis: Secondary | ICD-10-CM | POA: Diagnosis not present

## 2016-10-31 DIAGNOSIS — N186 End stage renal disease: Secondary | ICD-10-CM | POA: Diagnosis not present

## 2016-10-31 DIAGNOSIS — D631 Anemia in chronic kidney disease: Secondary | ICD-10-CM | POA: Diagnosis not present

## 2016-10-31 DIAGNOSIS — D509 Iron deficiency anemia, unspecified: Secondary | ICD-10-CM | POA: Diagnosis not present

## 2016-11-01 DIAGNOSIS — N186 End stage renal disease: Secondary | ICD-10-CM | POA: Diagnosis not present

## 2016-11-01 DIAGNOSIS — Z992 Dependence on renal dialysis: Secondary | ICD-10-CM | POA: Diagnosis not present

## 2016-11-01 DIAGNOSIS — D631 Anemia in chronic kidney disease: Secondary | ICD-10-CM | POA: Diagnosis not present

## 2016-11-01 DIAGNOSIS — D509 Iron deficiency anemia, unspecified: Secondary | ICD-10-CM | POA: Diagnosis not present

## 2016-11-01 DIAGNOSIS — N2581 Secondary hyperparathyroidism of renal origin: Secondary | ICD-10-CM | POA: Diagnosis not present

## 2016-11-02 DIAGNOSIS — D509 Iron deficiency anemia, unspecified: Secondary | ICD-10-CM | POA: Diagnosis not present

## 2016-11-02 DIAGNOSIS — N2581 Secondary hyperparathyroidism of renal origin: Secondary | ICD-10-CM | POA: Diagnosis not present

## 2016-11-02 DIAGNOSIS — N186 End stage renal disease: Secondary | ICD-10-CM | POA: Diagnosis not present

## 2016-11-02 DIAGNOSIS — Z992 Dependence on renal dialysis: Secondary | ICD-10-CM | POA: Diagnosis not present

## 2016-11-02 DIAGNOSIS — D631 Anemia in chronic kidney disease: Secondary | ICD-10-CM | POA: Diagnosis not present

## 2016-11-03 DIAGNOSIS — D631 Anemia in chronic kidney disease: Secondary | ICD-10-CM | POA: Diagnosis not present

## 2016-11-03 DIAGNOSIS — D509 Iron deficiency anemia, unspecified: Secondary | ICD-10-CM | POA: Diagnosis not present

## 2016-11-03 DIAGNOSIS — Z992 Dependence on renal dialysis: Secondary | ICD-10-CM | POA: Diagnosis not present

## 2016-11-03 DIAGNOSIS — N186 End stage renal disease: Secondary | ICD-10-CM | POA: Diagnosis not present

## 2016-11-03 DIAGNOSIS — N2581 Secondary hyperparathyroidism of renal origin: Secondary | ICD-10-CM | POA: Diagnosis not present

## 2016-11-04 DIAGNOSIS — N186 End stage renal disease: Secondary | ICD-10-CM | POA: Diagnosis not present

## 2016-11-04 DIAGNOSIS — N2581 Secondary hyperparathyroidism of renal origin: Secondary | ICD-10-CM | POA: Diagnosis not present

## 2016-11-04 DIAGNOSIS — D509 Iron deficiency anemia, unspecified: Secondary | ICD-10-CM | POA: Diagnosis not present

## 2016-11-04 DIAGNOSIS — Z992 Dependence on renal dialysis: Secondary | ICD-10-CM | POA: Diagnosis not present

## 2016-11-04 DIAGNOSIS — D631 Anemia in chronic kidney disease: Secondary | ICD-10-CM | POA: Diagnosis not present

## 2016-11-05 DIAGNOSIS — Z992 Dependence on renal dialysis: Secondary | ICD-10-CM | POA: Diagnosis not present

## 2016-11-05 DIAGNOSIS — N186 End stage renal disease: Secondary | ICD-10-CM | POA: Diagnosis not present

## 2016-11-05 DIAGNOSIS — D509 Iron deficiency anemia, unspecified: Secondary | ICD-10-CM | POA: Diagnosis not present

## 2016-11-05 DIAGNOSIS — D631 Anemia in chronic kidney disease: Secondary | ICD-10-CM | POA: Diagnosis not present

## 2016-11-05 DIAGNOSIS — N2581 Secondary hyperparathyroidism of renal origin: Secondary | ICD-10-CM | POA: Diagnosis not present

## 2016-11-06 DIAGNOSIS — D509 Iron deficiency anemia, unspecified: Secondary | ICD-10-CM | POA: Diagnosis not present

## 2016-11-06 DIAGNOSIS — D631 Anemia in chronic kidney disease: Secondary | ICD-10-CM | POA: Diagnosis not present

## 2016-11-06 DIAGNOSIS — N2581 Secondary hyperparathyroidism of renal origin: Secondary | ICD-10-CM | POA: Diagnosis not present

## 2016-11-06 DIAGNOSIS — N186 End stage renal disease: Secondary | ICD-10-CM | POA: Diagnosis not present

## 2016-11-06 DIAGNOSIS — Z992 Dependence on renal dialysis: Secondary | ICD-10-CM | POA: Diagnosis not present

## 2016-11-07 DIAGNOSIS — N186 End stage renal disease: Secondary | ICD-10-CM | POA: Diagnosis not present

## 2016-11-07 DIAGNOSIS — D509 Iron deficiency anemia, unspecified: Secondary | ICD-10-CM | POA: Diagnosis not present

## 2016-11-07 DIAGNOSIS — Z992 Dependence on renal dialysis: Secondary | ICD-10-CM | POA: Diagnosis not present

## 2016-11-07 DIAGNOSIS — D631 Anemia in chronic kidney disease: Secondary | ICD-10-CM | POA: Diagnosis not present

## 2016-11-07 DIAGNOSIS — N2581 Secondary hyperparathyroidism of renal origin: Secondary | ICD-10-CM | POA: Diagnosis not present

## 2016-11-08 DIAGNOSIS — D631 Anemia in chronic kidney disease: Secondary | ICD-10-CM | POA: Diagnosis not present

## 2016-11-08 DIAGNOSIS — N186 End stage renal disease: Secondary | ICD-10-CM | POA: Diagnosis not present

## 2016-11-08 DIAGNOSIS — D509 Iron deficiency anemia, unspecified: Secondary | ICD-10-CM | POA: Diagnosis not present

## 2016-11-08 DIAGNOSIS — N2581 Secondary hyperparathyroidism of renal origin: Secondary | ICD-10-CM | POA: Diagnosis not present

## 2016-11-08 DIAGNOSIS — Z992 Dependence on renal dialysis: Secondary | ICD-10-CM | POA: Diagnosis not present

## 2016-11-09 DIAGNOSIS — E11311 Type 2 diabetes mellitus with unspecified diabetic retinopathy with macular edema: Secondary | ICD-10-CM | POA: Diagnosis not present

## 2016-11-09 DIAGNOSIS — N2581 Secondary hyperparathyroidism of renal origin: Secondary | ICD-10-CM | POA: Diagnosis not present

## 2016-11-09 DIAGNOSIS — N186 End stage renal disease: Secondary | ICD-10-CM | POA: Diagnosis not present

## 2016-11-09 DIAGNOSIS — D631 Anemia in chronic kidney disease: Secondary | ICD-10-CM | POA: Diagnosis not present

## 2016-11-09 DIAGNOSIS — D509 Iron deficiency anemia, unspecified: Secondary | ICD-10-CM | POA: Diagnosis not present

## 2016-11-09 DIAGNOSIS — Z713 Dietary counseling and surveillance: Secondary | ICD-10-CM | POA: Diagnosis not present

## 2016-11-09 DIAGNOSIS — Z992 Dependence on renal dialysis: Secondary | ICD-10-CM | POA: Diagnosis not present

## 2016-11-09 DIAGNOSIS — Z7182 Exercise counseling: Secondary | ICD-10-CM | POA: Diagnosis not present

## 2016-11-09 DIAGNOSIS — Z0001 Encounter for general adult medical examination with abnormal findings: Secondary | ICD-10-CM | POA: Diagnosis not present

## 2016-11-10 DIAGNOSIS — N2581 Secondary hyperparathyroidism of renal origin: Secondary | ICD-10-CM | POA: Diagnosis not present

## 2016-11-10 DIAGNOSIS — N186 End stage renal disease: Secondary | ICD-10-CM | POA: Diagnosis not present

## 2016-11-10 DIAGNOSIS — D631 Anemia in chronic kidney disease: Secondary | ICD-10-CM | POA: Diagnosis not present

## 2016-11-10 DIAGNOSIS — Z992 Dependence on renal dialysis: Secondary | ICD-10-CM | POA: Diagnosis not present

## 2016-11-10 DIAGNOSIS — D509 Iron deficiency anemia, unspecified: Secondary | ICD-10-CM | POA: Diagnosis not present

## 2016-11-11 DIAGNOSIS — D509 Iron deficiency anemia, unspecified: Secondary | ICD-10-CM | POA: Diagnosis not present

## 2016-11-11 DIAGNOSIS — D631 Anemia in chronic kidney disease: Secondary | ICD-10-CM | POA: Diagnosis not present

## 2016-11-11 DIAGNOSIS — N2581 Secondary hyperparathyroidism of renal origin: Secondary | ICD-10-CM | POA: Diagnosis not present

## 2016-11-11 DIAGNOSIS — Z992 Dependence on renal dialysis: Secondary | ICD-10-CM | POA: Diagnosis not present

## 2016-11-11 DIAGNOSIS — N186 End stage renal disease: Secondary | ICD-10-CM | POA: Diagnosis not present

## 2016-11-12 DIAGNOSIS — N186 End stage renal disease: Secondary | ICD-10-CM | POA: Diagnosis not present

## 2016-11-12 DIAGNOSIS — D631 Anemia in chronic kidney disease: Secondary | ICD-10-CM | POA: Diagnosis not present

## 2016-11-12 DIAGNOSIS — D509 Iron deficiency anemia, unspecified: Secondary | ICD-10-CM | POA: Diagnosis not present

## 2016-11-12 DIAGNOSIS — N2581 Secondary hyperparathyroidism of renal origin: Secondary | ICD-10-CM | POA: Diagnosis not present

## 2016-11-12 DIAGNOSIS — Z992 Dependence on renal dialysis: Secondary | ICD-10-CM | POA: Diagnosis not present

## 2016-11-13 DIAGNOSIS — D509 Iron deficiency anemia, unspecified: Secondary | ICD-10-CM | POA: Diagnosis not present

## 2016-11-13 DIAGNOSIS — N2581 Secondary hyperparathyroidism of renal origin: Secondary | ICD-10-CM | POA: Diagnosis not present

## 2016-11-13 DIAGNOSIS — Z992 Dependence on renal dialysis: Secondary | ICD-10-CM | POA: Diagnosis not present

## 2016-11-13 DIAGNOSIS — D631 Anemia in chronic kidney disease: Secondary | ICD-10-CM | POA: Diagnosis not present

## 2016-11-13 DIAGNOSIS — N186 End stage renal disease: Secondary | ICD-10-CM | POA: Diagnosis not present

## 2016-11-14 DIAGNOSIS — D631 Anemia in chronic kidney disease: Secondary | ICD-10-CM | POA: Diagnosis not present

## 2016-11-14 DIAGNOSIS — Z992 Dependence on renal dialysis: Secondary | ICD-10-CM | POA: Diagnosis not present

## 2016-11-14 DIAGNOSIS — N2581 Secondary hyperparathyroidism of renal origin: Secondary | ICD-10-CM | POA: Diagnosis not present

## 2016-11-14 DIAGNOSIS — D509 Iron deficiency anemia, unspecified: Secondary | ICD-10-CM | POA: Diagnosis not present

## 2016-11-14 DIAGNOSIS — N186 End stage renal disease: Secondary | ICD-10-CM | POA: Diagnosis not present

## 2016-11-15 DIAGNOSIS — D631 Anemia in chronic kidney disease: Secondary | ICD-10-CM | POA: Diagnosis not present

## 2016-11-15 DIAGNOSIS — D509 Iron deficiency anemia, unspecified: Secondary | ICD-10-CM | POA: Diagnosis not present

## 2016-11-15 DIAGNOSIS — N2581 Secondary hyperparathyroidism of renal origin: Secondary | ICD-10-CM | POA: Diagnosis not present

## 2016-11-15 DIAGNOSIS — Z992 Dependence on renal dialysis: Secondary | ICD-10-CM | POA: Diagnosis not present

## 2016-11-15 DIAGNOSIS — N186 End stage renal disease: Secondary | ICD-10-CM | POA: Diagnosis not present

## 2016-11-16 DIAGNOSIS — D509 Iron deficiency anemia, unspecified: Secondary | ICD-10-CM | POA: Diagnosis not present

## 2016-11-16 DIAGNOSIS — Z992 Dependence on renal dialysis: Secondary | ICD-10-CM | POA: Diagnosis not present

## 2016-11-16 DIAGNOSIS — N2581 Secondary hyperparathyroidism of renal origin: Secondary | ICD-10-CM | POA: Diagnosis not present

## 2016-11-16 DIAGNOSIS — D631 Anemia in chronic kidney disease: Secondary | ICD-10-CM | POA: Diagnosis not present

## 2016-11-16 DIAGNOSIS — N186 End stage renal disease: Secondary | ICD-10-CM | POA: Diagnosis not present

## 2016-11-17 DIAGNOSIS — D509 Iron deficiency anemia, unspecified: Secondary | ICD-10-CM | POA: Diagnosis not present

## 2016-11-17 DIAGNOSIS — D631 Anemia in chronic kidney disease: Secondary | ICD-10-CM | POA: Diagnosis not present

## 2016-11-17 DIAGNOSIS — N186 End stage renal disease: Secondary | ICD-10-CM | POA: Diagnosis not present

## 2016-11-17 DIAGNOSIS — N2581 Secondary hyperparathyroidism of renal origin: Secondary | ICD-10-CM | POA: Diagnosis not present

## 2016-11-17 DIAGNOSIS — Z992 Dependence on renal dialysis: Secondary | ICD-10-CM | POA: Diagnosis not present

## 2016-11-18 DIAGNOSIS — N2581 Secondary hyperparathyroidism of renal origin: Secondary | ICD-10-CM | POA: Diagnosis not present

## 2016-11-18 DIAGNOSIS — D509 Iron deficiency anemia, unspecified: Secondary | ICD-10-CM | POA: Diagnosis not present

## 2016-11-18 DIAGNOSIS — K219 Gastro-esophageal reflux disease without esophagitis: Secondary | ICD-10-CM | POA: Diagnosis not present

## 2016-11-18 DIAGNOSIS — D649 Anemia, unspecified: Secondary | ICD-10-CM | POA: Diagnosis not present

## 2016-11-18 DIAGNOSIS — Z992 Dependence on renal dialysis: Secondary | ICD-10-CM | POA: Diagnosis not present

## 2016-11-18 DIAGNOSIS — Z6841 Body Mass Index (BMI) 40.0 and over, adult: Secondary | ICD-10-CM | POA: Diagnosis not present

## 2016-11-18 DIAGNOSIS — I10 Essential (primary) hypertension: Secondary | ICD-10-CM | POA: Diagnosis not present

## 2016-11-18 DIAGNOSIS — N186 End stage renal disease: Secondary | ICD-10-CM | POA: Diagnosis not present

## 2016-11-18 DIAGNOSIS — D631 Anemia in chronic kidney disease: Secondary | ICD-10-CM | POA: Diagnosis not present

## 2016-11-18 DIAGNOSIS — E119 Type 2 diabetes mellitus without complications: Secondary | ICD-10-CM | POA: Diagnosis not present

## 2016-11-19 DIAGNOSIS — N186 End stage renal disease: Secondary | ICD-10-CM | POA: Diagnosis not present

## 2016-11-19 DIAGNOSIS — N2581 Secondary hyperparathyroidism of renal origin: Secondary | ICD-10-CM | POA: Diagnosis not present

## 2016-11-19 DIAGNOSIS — D631 Anemia in chronic kidney disease: Secondary | ICD-10-CM | POA: Diagnosis not present

## 2016-11-19 DIAGNOSIS — D509 Iron deficiency anemia, unspecified: Secondary | ICD-10-CM | POA: Diagnosis not present

## 2016-11-19 DIAGNOSIS — Z992 Dependence on renal dialysis: Secondary | ICD-10-CM | POA: Diagnosis not present

## 2016-11-20 DIAGNOSIS — D631 Anemia in chronic kidney disease: Secondary | ICD-10-CM | POA: Diagnosis not present

## 2016-11-20 DIAGNOSIS — N186 End stage renal disease: Secondary | ICD-10-CM | POA: Diagnosis not present

## 2016-11-20 DIAGNOSIS — D509 Iron deficiency anemia, unspecified: Secondary | ICD-10-CM | POA: Diagnosis not present

## 2016-11-20 DIAGNOSIS — Z992 Dependence on renal dialysis: Secondary | ICD-10-CM | POA: Diagnosis not present

## 2016-11-20 DIAGNOSIS — N2581 Secondary hyperparathyroidism of renal origin: Secondary | ICD-10-CM | POA: Diagnosis not present

## 2016-11-21 DIAGNOSIS — Z992 Dependence on renal dialysis: Secondary | ICD-10-CM | POA: Diagnosis not present

## 2016-11-21 DIAGNOSIS — D631 Anemia in chronic kidney disease: Secondary | ICD-10-CM | POA: Diagnosis not present

## 2016-11-21 DIAGNOSIS — D509 Iron deficiency anemia, unspecified: Secondary | ICD-10-CM | POA: Diagnosis not present

## 2016-11-21 DIAGNOSIS — N186 End stage renal disease: Secondary | ICD-10-CM | POA: Diagnosis not present

## 2016-11-21 DIAGNOSIS — N2581 Secondary hyperparathyroidism of renal origin: Secondary | ICD-10-CM | POA: Diagnosis not present

## 2016-11-22 DIAGNOSIS — N2581 Secondary hyperparathyroidism of renal origin: Secondary | ICD-10-CM | POA: Diagnosis not present

## 2016-11-22 DIAGNOSIS — N186 End stage renal disease: Secondary | ICD-10-CM | POA: Diagnosis not present

## 2016-11-22 DIAGNOSIS — D631 Anemia in chronic kidney disease: Secondary | ICD-10-CM | POA: Diagnosis not present

## 2016-11-22 DIAGNOSIS — Z992 Dependence on renal dialysis: Secondary | ICD-10-CM | POA: Diagnosis not present

## 2016-11-22 DIAGNOSIS — D509 Iron deficiency anemia, unspecified: Secondary | ICD-10-CM | POA: Diagnosis not present

## 2016-11-23 DIAGNOSIS — N2581 Secondary hyperparathyroidism of renal origin: Secondary | ICD-10-CM | POA: Diagnosis not present

## 2016-11-23 DIAGNOSIS — N186 End stage renal disease: Secondary | ICD-10-CM | POA: Diagnosis not present

## 2016-11-23 DIAGNOSIS — D509 Iron deficiency anemia, unspecified: Secondary | ICD-10-CM | POA: Diagnosis not present

## 2016-11-23 DIAGNOSIS — D631 Anemia in chronic kidney disease: Secondary | ICD-10-CM | POA: Diagnosis not present

## 2016-11-23 DIAGNOSIS — Z992 Dependence on renal dialysis: Secondary | ICD-10-CM | POA: Diagnosis not present

## 2016-11-24 DIAGNOSIS — N2581 Secondary hyperparathyroidism of renal origin: Secondary | ICD-10-CM | POA: Diagnosis not present

## 2016-11-24 DIAGNOSIS — N186 End stage renal disease: Secondary | ICD-10-CM | POA: Diagnosis not present

## 2016-11-24 DIAGNOSIS — Z992 Dependence on renal dialysis: Secondary | ICD-10-CM | POA: Diagnosis not present

## 2016-11-24 DIAGNOSIS — D509 Iron deficiency anemia, unspecified: Secondary | ICD-10-CM | POA: Diagnosis not present

## 2016-11-24 DIAGNOSIS — D631 Anemia in chronic kidney disease: Secondary | ICD-10-CM | POA: Diagnosis not present

## 2016-11-25 DIAGNOSIS — D509 Iron deficiency anemia, unspecified: Secondary | ICD-10-CM | POA: Diagnosis not present

## 2016-11-25 DIAGNOSIS — N186 End stage renal disease: Secondary | ICD-10-CM | POA: Diagnosis not present

## 2016-11-25 DIAGNOSIS — N2581 Secondary hyperparathyroidism of renal origin: Secondary | ICD-10-CM | POA: Diagnosis not present

## 2016-11-25 DIAGNOSIS — Z992 Dependence on renal dialysis: Secondary | ICD-10-CM | POA: Diagnosis not present

## 2016-11-25 DIAGNOSIS — D631 Anemia in chronic kidney disease: Secondary | ICD-10-CM | POA: Diagnosis not present

## 2016-11-26 DIAGNOSIS — N2581 Secondary hyperparathyroidism of renal origin: Secondary | ICD-10-CM | POA: Diagnosis not present

## 2016-11-26 DIAGNOSIS — D631 Anemia in chronic kidney disease: Secondary | ICD-10-CM | POA: Diagnosis not present

## 2016-11-26 DIAGNOSIS — Z992 Dependence on renal dialysis: Secondary | ICD-10-CM | POA: Diagnosis not present

## 2016-11-26 DIAGNOSIS — N186 End stage renal disease: Secondary | ICD-10-CM | POA: Diagnosis not present

## 2016-11-27 DIAGNOSIS — N186 End stage renal disease: Secondary | ICD-10-CM | POA: Diagnosis not present

## 2016-11-27 DIAGNOSIS — D631 Anemia in chronic kidney disease: Secondary | ICD-10-CM | POA: Diagnosis not present

## 2016-11-27 DIAGNOSIS — N2581 Secondary hyperparathyroidism of renal origin: Secondary | ICD-10-CM | POA: Diagnosis not present

## 2016-11-27 DIAGNOSIS — Z992 Dependence on renal dialysis: Secondary | ICD-10-CM | POA: Diagnosis not present

## 2016-11-28 DIAGNOSIS — Z992 Dependence on renal dialysis: Secondary | ICD-10-CM | POA: Diagnosis not present

## 2016-11-28 DIAGNOSIS — D631 Anemia in chronic kidney disease: Secondary | ICD-10-CM | POA: Diagnosis not present

## 2016-11-28 DIAGNOSIS — N2581 Secondary hyperparathyroidism of renal origin: Secondary | ICD-10-CM | POA: Diagnosis not present

## 2016-11-28 DIAGNOSIS — N186 End stage renal disease: Secondary | ICD-10-CM | POA: Diagnosis not present

## 2016-11-29 DIAGNOSIS — N186 End stage renal disease: Secondary | ICD-10-CM | POA: Diagnosis not present

## 2016-11-29 DIAGNOSIS — D631 Anemia in chronic kidney disease: Secondary | ICD-10-CM | POA: Diagnosis not present

## 2016-11-29 DIAGNOSIS — Z992 Dependence on renal dialysis: Secondary | ICD-10-CM | POA: Diagnosis not present

## 2016-11-29 DIAGNOSIS — N2581 Secondary hyperparathyroidism of renal origin: Secondary | ICD-10-CM | POA: Diagnosis not present

## 2016-11-30 DIAGNOSIS — N2581 Secondary hyperparathyroidism of renal origin: Secondary | ICD-10-CM | POA: Diagnosis not present

## 2016-11-30 DIAGNOSIS — D631 Anemia in chronic kidney disease: Secondary | ICD-10-CM | POA: Diagnosis not present

## 2016-11-30 DIAGNOSIS — N186 End stage renal disease: Secondary | ICD-10-CM | POA: Diagnosis not present

## 2016-11-30 DIAGNOSIS — Z992 Dependence on renal dialysis: Secondary | ICD-10-CM | POA: Diagnosis not present

## 2016-12-01 DIAGNOSIS — N186 End stage renal disease: Secondary | ICD-10-CM | POA: Diagnosis not present

## 2016-12-01 DIAGNOSIS — D631 Anemia in chronic kidney disease: Secondary | ICD-10-CM | POA: Diagnosis not present

## 2016-12-01 DIAGNOSIS — N2581 Secondary hyperparathyroidism of renal origin: Secondary | ICD-10-CM | POA: Diagnosis not present

## 2016-12-01 DIAGNOSIS — Z992 Dependence on renal dialysis: Secondary | ICD-10-CM | POA: Diagnosis not present

## 2016-12-02 DIAGNOSIS — N2581 Secondary hyperparathyroidism of renal origin: Secondary | ICD-10-CM | POA: Diagnosis not present

## 2016-12-02 DIAGNOSIS — D631 Anemia in chronic kidney disease: Secondary | ICD-10-CM | POA: Diagnosis not present

## 2016-12-02 DIAGNOSIS — Z992 Dependence on renal dialysis: Secondary | ICD-10-CM | POA: Diagnosis not present

## 2016-12-02 DIAGNOSIS — N186 End stage renal disease: Secondary | ICD-10-CM | POA: Diagnosis not present

## 2016-12-03 DIAGNOSIS — Z992 Dependence on renal dialysis: Secondary | ICD-10-CM | POA: Diagnosis not present

## 2016-12-03 DIAGNOSIS — D631 Anemia in chronic kidney disease: Secondary | ICD-10-CM | POA: Diagnosis not present

## 2016-12-03 DIAGNOSIS — N2581 Secondary hyperparathyroidism of renal origin: Secondary | ICD-10-CM | POA: Diagnosis not present

## 2016-12-03 DIAGNOSIS — N186 End stage renal disease: Secondary | ICD-10-CM | POA: Diagnosis not present

## 2016-12-04 DIAGNOSIS — Z992 Dependence on renal dialysis: Secondary | ICD-10-CM | POA: Diagnosis not present

## 2016-12-04 DIAGNOSIS — N186 End stage renal disease: Secondary | ICD-10-CM | POA: Diagnosis not present

## 2016-12-04 DIAGNOSIS — N2581 Secondary hyperparathyroidism of renal origin: Secondary | ICD-10-CM | POA: Diagnosis not present

## 2016-12-04 DIAGNOSIS — D631 Anemia in chronic kidney disease: Secondary | ICD-10-CM | POA: Diagnosis not present

## 2016-12-05 DIAGNOSIS — N186 End stage renal disease: Secondary | ICD-10-CM | POA: Diagnosis not present

## 2016-12-05 DIAGNOSIS — D631 Anemia in chronic kidney disease: Secondary | ICD-10-CM | POA: Diagnosis not present

## 2016-12-05 DIAGNOSIS — N2581 Secondary hyperparathyroidism of renal origin: Secondary | ICD-10-CM | POA: Diagnosis not present

## 2016-12-05 DIAGNOSIS — Z992 Dependence on renal dialysis: Secondary | ICD-10-CM | POA: Diagnosis not present

## 2016-12-06 DIAGNOSIS — N186 End stage renal disease: Secondary | ICD-10-CM | POA: Diagnosis not present

## 2016-12-06 DIAGNOSIS — Z992 Dependence on renal dialysis: Secondary | ICD-10-CM | POA: Diagnosis not present

## 2016-12-06 DIAGNOSIS — N2581 Secondary hyperparathyroidism of renal origin: Secondary | ICD-10-CM | POA: Diagnosis not present

## 2016-12-06 DIAGNOSIS — D631 Anemia in chronic kidney disease: Secondary | ICD-10-CM | POA: Diagnosis not present

## 2016-12-07 DIAGNOSIS — N2581 Secondary hyperparathyroidism of renal origin: Secondary | ICD-10-CM | POA: Diagnosis not present

## 2016-12-07 DIAGNOSIS — N186 End stage renal disease: Secondary | ICD-10-CM | POA: Diagnosis not present

## 2016-12-07 DIAGNOSIS — Z992 Dependence on renal dialysis: Secondary | ICD-10-CM | POA: Diagnosis not present

## 2016-12-07 DIAGNOSIS — D631 Anemia in chronic kidney disease: Secondary | ICD-10-CM | POA: Diagnosis not present

## 2016-12-08 DIAGNOSIS — Z992 Dependence on renal dialysis: Secondary | ICD-10-CM | POA: Diagnosis not present

## 2016-12-08 DIAGNOSIS — D631 Anemia in chronic kidney disease: Secondary | ICD-10-CM | POA: Diagnosis not present

## 2016-12-08 DIAGNOSIS — N2581 Secondary hyperparathyroidism of renal origin: Secondary | ICD-10-CM | POA: Diagnosis not present

## 2016-12-08 DIAGNOSIS — N186 End stage renal disease: Secondary | ICD-10-CM | POA: Diagnosis not present

## 2016-12-09 DIAGNOSIS — D631 Anemia in chronic kidney disease: Secondary | ICD-10-CM | POA: Diagnosis not present

## 2016-12-09 DIAGNOSIS — N186 End stage renal disease: Secondary | ICD-10-CM | POA: Diagnosis not present

## 2016-12-09 DIAGNOSIS — N2581 Secondary hyperparathyroidism of renal origin: Secondary | ICD-10-CM | POA: Diagnosis not present

## 2016-12-09 DIAGNOSIS — Z992 Dependence on renal dialysis: Secondary | ICD-10-CM | POA: Diagnosis not present

## 2016-12-10 DIAGNOSIS — N186 End stage renal disease: Secondary | ICD-10-CM | POA: Diagnosis not present

## 2016-12-10 DIAGNOSIS — N2581 Secondary hyperparathyroidism of renal origin: Secondary | ICD-10-CM | POA: Diagnosis not present

## 2016-12-10 DIAGNOSIS — D631 Anemia in chronic kidney disease: Secondary | ICD-10-CM | POA: Diagnosis not present

## 2016-12-10 DIAGNOSIS — Z992 Dependence on renal dialysis: Secondary | ICD-10-CM | POA: Diagnosis not present

## 2016-12-11 DIAGNOSIS — N186 End stage renal disease: Secondary | ICD-10-CM | POA: Diagnosis not present

## 2016-12-11 DIAGNOSIS — N2581 Secondary hyperparathyroidism of renal origin: Secondary | ICD-10-CM | POA: Diagnosis not present

## 2016-12-11 DIAGNOSIS — Z992 Dependence on renal dialysis: Secondary | ICD-10-CM | POA: Diagnosis not present

## 2016-12-11 DIAGNOSIS — D631 Anemia in chronic kidney disease: Secondary | ICD-10-CM | POA: Diagnosis not present

## 2016-12-12 DIAGNOSIS — N186 End stage renal disease: Secondary | ICD-10-CM | POA: Diagnosis not present

## 2016-12-12 DIAGNOSIS — N2581 Secondary hyperparathyroidism of renal origin: Secondary | ICD-10-CM | POA: Diagnosis not present

## 2016-12-12 DIAGNOSIS — Z992 Dependence on renal dialysis: Secondary | ICD-10-CM | POA: Diagnosis not present

## 2016-12-12 DIAGNOSIS — D631 Anemia in chronic kidney disease: Secondary | ICD-10-CM | POA: Diagnosis not present

## 2016-12-13 DIAGNOSIS — D631 Anemia in chronic kidney disease: Secondary | ICD-10-CM | POA: Diagnosis not present

## 2016-12-13 DIAGNOSIS — Z992 Dependence on renal dialysis: Secondary | ICD-10-CM | POA: Diagnosis not present

## 2016-12-13 DIAGNOSIS — N186 End stage renal disease: Secondary | ICD-10-CM | POA: Diagnosis not present

## 2016-12-13 DIAGNOSIS — N2581 Secondary hyperparathyroidism of renal origin: Secondary | ICD-10-CM | POA: Diagnosis not present

## 2016-12-14 DIAGNOSIS — N2581 Secondary hyperparathyroidism of renal origin: Secondary | ICD-10-CM | POA: Diagnosis not present

## 2016-12-14 DIAGNOSIS — D631 Anemia in chronic kidney disease: Secondary | ICD-10-CM | POA: Diagnosis not present

## 2016-12-14 DIAGNOSIS — N186 End stage renal disease: Secondary | ICD-10-CM | POA: Diagnosis not present

## 2016-12-14 DIAGNOSIS — Z992 Dependence on renal dialysis: Secondary | ICD-10-CM | POA: Diagnosis not present

## 2016-12-15 DIAGNOSIS — N186 End stage renal disease: Secondary | ICD-10-CM | POA: Diagnosis not present

## 2016-12-15 DIAGNOSIS — N2581 Secondary hyperparathyroidism of renal origin: Secondary | ICD-10-CM | POA: Diagnosis not present

## 2016-12-15 DIAGNOSIS — Z992 Dependence on renal dialysis: Secondary | ICD-10-CM | POA: Diagnosis not present

## 2016-12-15 DIAGNOSIS — D631 Anemia in chronic kidney disease: Secondary | ICD-10-CM | POA: Diagnosis not present

## 2016-12-16 DIAGNOSIS — N186 End stage renal disease: Secondary | ICD-10-CM | POA: Diagnosis not present

## 2016-12-16 DIAGNOSIS — Z992 Dependence on renal dialysis: Secondary | ICD-10-CM | POA: Diagnosis not present

## 2016-12-16 DIAGNOSIS — N2581 Secondary hyperparathyroidism of renal origin: Secondary | ICD-10-CM | POA: Diagnosis not present

## 2016-12-16 DIAGNOSIS — D631 Anemia in chronic kidney disease: Secondary | ICD-10-CM | POA: Diagnosis not present

## 2016-12-17 DIAGNOSIS — D631 Anemia in chronic kidney disease: Secondary | ICD-10-CM | POA: Diagnosis not present

## 2016-12-17 DIAGNOSIS — N2581 Secondary hyperparathyroidism of renal origin: Secondary | ICD-10-CM | POA: Diagnosis not present

## 2016-12-17 DIAGNOSIS — Z992 Dependence on renal dialysis: Secondary | ICD-10-CM | POA: Diagnosis not present

## 2016-12-17 DIAGNOSIS — N186 End stage renal disease: Secondary | ICD-10-CM | POA: Diagnosis not present

## 2016-12-18 DIAGNOSIS — Z992 Dependence on renal dialysis: Secondary | ICD-10-CM | POA: Diagnosis not present

## 2016-12-18 DIAGNOSIS — N186 End stage renal disease: Secondary | ICD-10-CM | POA: Diagnosis not present

## 2016-12-18 DIAGNOSIS — N2581 Secondary hyperparathyroidism of renal origin: Secondary | ICD-10-CM | POA: Diagnosis not present

## 2016-12-18 DIAGNOSIS — D631 Anemia in chronic kidney disease: Secondary | ICD-10-CM | POA: Diagnosis not present

## 2016-12-19 DIAGNOSIS — D631 Anemia in chronic kidney disease: Secondary | ICD-10-CM | POA: Diagnosis not present

## 2016-12-19 DIAGNOSIS — N2581 Secondary hyperparathyroidism of renal origin: Secondary | ICD-10-CM | POA: Diagnosis not present

## 2016-12-19 DIAGNOSIS — N186 End stage renal disease: Secondary | ICD-10-CM | POA: Diagnosis not present

## 2016-12-19 DIAGNOSIS — Z992 Dependence on renal dialysis: Secondary | ICD-10-CM | POA: Diagnosis not present

## 2016-12-20 DIAGNOSIS — Z992 Dependence on renal dialysis: Secondary | ICD-10-CM | POA: Diagnosis not present

## 2016-12-20 DIAGNOSIS — D631 Anemia in chronic kidney disease: Secondary | ICD-10-CM | POA: Diagnosis not present

## 2016-12-20 DIAGNOSIS — N186 End stage renal disease: Secondary | ICD-10-CM | POA: Diagnosis not present

## 2016-12-20 DIAGNOSIS — N2581 Secondary hyperparathyroidism of renal origin: Secondary | ICD-10-CM | POA: Diagnosis not present

## 2016-12-21 DIAGNOSIS — D631 Anemia in chronic kidney disease: Secondary | ICD-10-CM | POA: Diagnosis not present

## 2016-12-21 DIAGNOSIS — Z992 Dependence on renal dialysis: Secondary | ICD-10-CM | POA: Diagnosis not present

## 2016-12-21 DIAGNOSIS — N186 End stage renal disease: Secondary | ICD-10-CM | POA: Diagnosis not present

## 2016-12-21 DIAGNOSIS — N2581 Secondary hyperparathyroidism of renal origin: Secondary | ICD-10-CM | POA: Diagnosis not present

## 2016-12-22 DIAGNOSIS — N2581 Secondary hyperparathyroidism of renal origin: Secondary | ICD-10-CM | POA: Diagnosis not present

## 2016-12-22 DIAGNOSIS — D631 Anemia in chronic kidney disease: Secondary | ICD-10-CM | POA: Diagnosis not present

## 2016-12-22 DIAGNOSIS — Z992 Dependence on renal dialysis: Secondary | ICD-10-CM | POA: Diagnosis not present

## 2016-12-22 DIAGNOSIS — N186 End stage renal disease: Secondary | ICD-10-CM | POA: Diagnosis not present

## 2016-12-23 DIAGNOSIS — Z992 Dependence on renal dialysis: Secondary | ICD-10-CM | POA: Diagnosis not present

## 2016-12-23 DIAGNOSIS — N2581 Secondary hyperparathyroidism of renal origin: Secondary | ICD-10-CM | POA: Diagnosis not present

## 2016-12-23 DIAGNOSIS — N186 End stage renal disease: Secondary | ICD-10-CM | POA: Diagnosis not present

## 2016-12-23 DIAGNOSIS — D631 Anemia in chronic kidney disease: Secondary | ICD-10-CM | POA: Diagnosis not present

## 2016-12-24 DIAGNOSIS — D631 Anemia in chronic kidney disease: Secondary | ICD-10-CM | POA: Diagnosis not present

## 2016-12-24 DIAGNOSIS — N2581 Secondary hyperparathyroidism of renal origin: Secondary | ICD-10-CM | POA: Diagnosis not present

## 2016-12-24 DIAGNOSIS — Z992 Dependence on renal dialysis: Secondary | ICD-10-CM | POA: Diagnosis not present

## 2016-12-24 DIAGNOSIS — N186 End stage renal disease: Secondary | ICD-10-CM | POA: Diagnosis not present

## 2016-12-25 DIAGNOSIS — D631 Anemia in chronic kidney disease: Secondary | ICD-10-CM | POA: Diagnosis not present

## 2016-12-25 DIAGNOSIS — N2581 Secondary hyperparathyroidism of renal origin: Secondary | ICD-10-CM | POA: Diagnosis not present

## 2016-12-25 DIAGNOSIS — N186 End stage renal disease: Secondary | ICD-10-CM | POA: Diagnosis not present

## 2016-12-25 DIAGNOSIS — Z992 Dependence on renal dialysis: Secondary | ICD-10-CM | POA: Diagnosis not present

## 2016-12-26 DIAGNOSIS — D631 Anemia in chronic kidney disease: Secondary | ICD-10-CM | POA: Diagnosis not present

## 2016-12-26 DIAGNOSIS — N186 End stage renal disease: Secondary | ICD-10-CM | POA: Diagnosis not present

## 2016-12-26 DIAGNOSIS — Z992 Dependence on renal dialysis: Secondary | ICD-10-CM | POA: Diagnosis not present

## 2016-12-26 DIAGNOSIS — E559 Vitamin D deficiency, unspecified: Secondary | ICD-10-CM | POA: Diagnosis not present

## 2016-12-26 DIAGNOSIS — N2581 Secondary hyperparathyroidism of renal origin: Secondary | ICD-10-CM | POA: Diagnosis not present

## 2016-12-27 DIAGNOSIS — N186 End stage renal disease: Secondary | ICD-10-CM | POA: Diagnosis not present

## 2016-12-27 DIAGNOSIS — E559 Vitamin D deficiency, unspecified: Secondary | ICD-10-CM | POA: Diagnosis not present

## 2016-12-27 DIAGNOSIS — D631 Anemia in chronic kidney disease: Secondary | ICD-10-CM | POA: Diagnosis not present

## 2016-12-27 DIAGNOSIS — N2581 Secondary hyperparathyroidism of renal origin: Secondary | ICD-10-CM | POA: Diagnosis not present

## 2016-12-27 DIAGNOSIS — Z992 Dependence on renal dialysis: Secondary | ICD-10-CM | POA: Diagnosis not present

## 2016-12-28 DIAGNOSIS — E559 Vitamin D deficiency, unspecified: Secondary | ICD-10-CM | POA: Diagnosis not present

## 2016-12-28 DIAGNOSIS — N186 End stage renal disease: Secondary | ICD-10-CM | POA: Diagnosis not present

## 2016-12-28 DIAGNOSIS — Z992 Dependence on renal dialysis: Secondary | ICD-10-CM | POA: Diagnosis not present

## 2016-12-28 DIAGNOSIS — N2581 Secondary hyperparathyroidism of renal origin: Secondary | ICD-10-CM | POA: Diagnosis not present

## 2016-12-28 DIAGNOSIS — D631 Anemia in chronic kidney disease: Secondary | ICD-10-CM | POA: Diagnosis not present

## 2016-12-29 DIAGNOSIS — E559 Vitamin D deficiency, unspecified: Secondary | ICD-10-CM | POA: Diagnosis not present

## 2016-12-29 DIAGNOSIS — D631 Anemia in chronic kidney disease: Secondary | ICD-10-CM | POA: Diagnosis not present

## 2016-12-29 DIAGNOSIS — N2581 Secondary hyperparathyroidism of renal origin: Secondary | ICD-10-CM | POA: Diagnosis not present

## 2016-12-29 DIAGNOSIS — N186 End stage renal disease: Secondary | ICD-10-CM | POA: Diagnosis not present

## 2016-12-29 DIAGNOSIS — Z713 Dietary counseling and surveillance: Secondary | ICD-10-CM | POA: Diagnosis not present

## 2016-12-29 DIAGNOSIS — Z992 Dependence on renal dialysis: Secondary | ICD-10-CM | POA: Diagnosis not present

## 2016-12-29 DIAGNOSIS — E119 Type 2 diabetes mellitus without complications: Secondary | ICD-10-CM | POA: Diagnosis not present

## 2016-12-30 DIAGNOSIS — D631 Anemia in chronic kidney disease: Secondary | ICD-10-CM | POA: Diagnosis not present

## 2016-12-30 DIAGNOSIS — N186 End stage renal disease: Secondary | ICD-10-CM | POA: Diagnosis not present

## 2016-12-30 DIAGNOSIS — E559 Vitamin D deficiency, unspecified: Secondary | ICD-10-CM | POA: Diagnosis not present

## 2016-12-30 DIAGNOSIS — Z992 Dependence on renal dialysis: Secondary | ICD-10-CM | POA: Diagnosis not present

## 2016-12-30 DIAGNOSIS — E119 Type 2 diabetes mellitus without complications: Secondary | ICD-10-CM | POA: Diagnosis not present

## 2016-12-30 DIAGNOSIS — Z794 Long term (current) use of insulin: Secondary | ICD-10-CM | POA: Diagnosis not present

## 2016-12-30 DIAGNOSIS — N2581 Secondary hyperparathyroidism of renal origin: Secondary | ICD-10-CM | POA: Diagnosis not present

## 2016-12-31 DIAGNOSIS — N186 End stage renal disease: Secondary | ICD-10-CM | POA: Diagnosis not present

## 2016-12-31 DIAGNOSIS — E559 Vitamin D deficiency, unspecified: Secondary | ICD-10-CM | POA: Diagnosis not present

## 2016-12-31 DIAGNOSIS — N2581 Secondary hyperparathyroidism of renal origin: Secondary | ICD-10-CM | POA: Diagnosis not present

## 2016-12-31 DIAGNOSIS — D631 Anemia in chronic kidney disease: Secondary | ICD-10-CM | POA: Diagnosis not present

## 2016-12-31 DIAGNOSIS — Z992 Dependence on renal dialysis: Secondary | ICD-10-CM | POA: Diagnosis not present

## 2017-01-01 DIAGNOSIS — E559 Vitamin D deficiency, unspecified: Secondary | ICD-10-CM | POA: Diagnosis not present

## 2017-01-01 DIAGNOSIS — N2581 Secondary hyperparathyroidism of renal origin: Secondary | ICD-10-CM | POA: Diagnosis not present

## 2017-01-01 DIAGNOSIS — N186 End stage renal disease: Secondary | ICD-10-CM | POA: Diagnosis not present

## 2017-01-01 DIAGNOSIS — D631 Anemia in chronic kidney disease: Secondary | ICD-10-CM | POA: Diagnosis not present

## 2017-01-01 DIAGNOSIS — Z992 Dependence on renal dialysis: Secondary | ICD-10-CM | POA: Diagnosis not present

## 2017-01-02 DIAGNOSIS — Z992 Dependence on renal dialysis: Secondary | ICD-10-CM | POA: Diagnosis not present

## 2017-01-02 DIAGNOSIS — D631 Anemia in chronic kidney disease: Secondary | ICD-10-CM | POA: Diagnosis not present

## 2017-01-02 DIAGNOSIS — E559 Vitamin D deficiency, unspecified: Secondary | ICD-10-CM | POA: Diagnosis not present

## 2017-01-02 DIAGNOSIS — N2581 Secondary hyperparathyroidism of renal origin: Secondary | ICD-10-CM | POA: Diagnosis not present

## 2017-01-02 DIAGNOSIS — N186 End stage renal disease: Secondary | ICD-10-CM | POA: Diagnosis not present

## 2017-01-03 DIAGNOSIS — N2581 Secondary hyperparathyroidism of renal origin: Secondary | ICD-10-CM | POA: Diagnosis not present

## 2017-01-03 DIAGNOSIS — Z992 Dependence on renal dialysis: Secondary | ICD-10-CM | POA: Diagnosis not present

## 2017-01-03 DIAGNOSIS — E559 Vitamin D deficiency, unspecified: Secondary | ICD-10-CM | POA: Diagnosis not present

## 2017-01-03 DIAGNOSIS — D631 Anemia in chronic kidney disease: Secondary | ICD-10-CM | POA: Diagnosis not present

## 2017-01-03 DIAGNOSIS — N186 End stage renal disease: Secondary | ICD-10-CM | POA: Diagnosis not present

## 2017-01-04 DIAGNOSIS — D631 Anemia in chronic kidney disease: Secondary | ICD-10-CM | POA: Diagnosis not present

## 2017-01-04 DIAGNOSIS — E559 Vitamin D deficiency, unspecified: Secondary | ICD-10-CM | POA: Diagnosis not present

## 2017-01-04 DIAGNOSIS — Z992 Dependence on renal dialysis: Secondary | ICD-10-CM | POA: Diagnosis not present

## 2017-01-04 DIAGNOSIS — N2581 Secondary hyperparathyroidism of renal origin: Secondary | ICD-10-CM | POA: Diagnosis not present

## 2017-01-04 DIAGNOSIS — N186 End stage renal disease: Secondary | ICD-10-CM | POA: Diagnosis not present

## 2017-01-05 DIAGNOSIS — Z992 Dependence on renal dialysis: Secondary | ICD-10-CM | POA: Diagnosis not present

## 2017-01-05 DIAGNOSIS — N186 End stage renal disease: Secondary | ICD-10-CM | POA: Diagnosis not present

## 2017-01-05 DIAGNOSIS — N2581 Secondary hyperparathyroidism of renal origin: Secondary | ICD-10-CM | POA: Diagnosis not present

## 2017-01-05 DIAGNOSIS — E559 Vitamin D deficiency, unspecified: Secondary | ICD-10-CM | POA: Diagnosis not present

## 2017-01-05 DIAGNOSIS — D631 Anemia in chronic kidney disease: Secondary | ICD-10-CM | POA: Diagnosis not present

## 2017-01-06 DIAGNOSIS — Z992 Dependence on renal dialysis: Secondary | ICD-10-CM | POA: Diagnosis not present

## 2017-01-06 DIAGNOSIS — E559 Vitamin D deficiency, unspecified: Secondary | ICD-10-CM | POA: Diagnosis not present

## 2017-01-06 DIAGNOSIS — N186 End stage renal disease: Secondary | ICD-10-CM | POA: Diagnosis not present

## 2017-01-06 DIAGNOSIS — N2581 Secondary hyperparathyroidism of renal origin: Secondary | ICD-10-CM | POA: Diagnosis not present

## 2017-01-06 DIAGNOSIS — D631 Anemia in chronic kidney disease: Secondary | ICD-10-CM | POA: Diagnosis not present

## 2017-01-07 DIAGNOSIS — N186 End stage renal disease: Secondary | ICD-10-CM | POA: Diagnosis not present

## 2017-01-07 DIAGNOSIS — D631 Anemia in chronic kidney disease: Secondary | ICD-10-CM | POA: Diagnosis not present

## 2017-01-07 DIAGNOSIS — Z992 Dependence on renal dialysis: Secondary | ICD-10-CM | POA: Diagnosis not present

## 2017-01-07 DIAGNOSIS — E559 Vitamin D deficiency, unspecified: Secondary | ICD-10-CM | POA: Diagnosis not present

## 2017-01-07 DIAGNOSIS — N2581 Secondary hyperparathyroidism of renal origin: Secondary | ICD-10-CM | POA: Diagnosis not present

## 2017-01-08 DIAGNOSIS — D631 Anemia in chronic kidney disease: Secondary | ICD-10-CM | POA: Diagnosis not present

## 2017-01-08 DIAGNOSIS — E559 Vitamin D deficiency, unspecified: Secondary | ICD-10-CM | POA: Diagnosis not present

## 2017-01-08 DIAGNOSIS — N186 End stage renal disease: Secondary | ICD-10-CM | POA: Diagnosis not present

## 2017-01-08 DIAGNOSIS — Z992 Dependence on renal dialysis: Secondary | ICD-10-CM | POA: Diagnosis not present

## 2017-01-08 DIAGNOSIS — N2581 Secondary hyperparathyroidism of renal origin: Secondary | ICD-10-CM | POA: Diagnosis not present

## 2017-01-09 DIAGNOSIS — D631 Anemia in chronic kidney disease: Secondary | ICD-10-CM | POA: Diagnosis not present

## 2017-01-09 DIAGNOSIS — N2581 Secondary hyperparathyroidism of renal origin: Secondary | ICD-10-CM | POA: Diagnosis not present

## 2017-01-09 DIAGNOSIS — N186 End stage renal disease: Secondary | ICD-10-CM | POA: Diagnosis not present

## 2017-01-09 DIAGNOSIS — Z992 Dependence on renal dialysis: Secondary | ICD-10-CM | POA: Diagnosis not present

## 2017-01-09 DIAGNOSIS — E559 Vitamin D deficiency, unspecified: Secondary | ICD-10-CM | POA: Diagnosis not present

## 2017-01-10 DIAGNOSIS — Z992 Dependence on renal dialysis: Secondary | ICD-10-CM | POA: Diagnosis not present

## 2017-01-10 DIAGNOSIS — N2581 Secondary hyperparathyroidism of renal origin: Secondary | ICD-10-CM | POA: Diagnosis not present

## 2017-01-10 DIAGNOSIS — D631 Anemia in chronic kidney disease: Secondary | ICD-10-CM | POA: Diagnosis not present

## 2017-01-10 DIAGNOSIS — E559 Vitamin D deficiency, unspecified: Secondary | ICD-10-CM | POA: Diagnosis not present

## 2017-01-10 DIAGNOSIS — N186 End stage renal disease: Secondary | ICD-10-CM | POA: Diagnosis not present

## 2017-01-11 DIAGNOSIS — N186 End stage renal disease: Secondary | ICD-10-CM | POA: Diagnosis not present

## 2017-01-11 DIAGNOSIS — E559 Vitamin D deficiency, unspecified: Secondary | ICD-10-CM | POA: Diagnosis not present

## 2017-01-11 DIAGNOSIS — Z992 Dependence on renal dialysis: Secondary | ICD-10-CM | POA: Diagnosis not present

## 2017-01-11 DIAGNOSIS — N2581 Secondary hyperparathyroidism of renal origin: Secondary | ICD-10-CM | POA: Diagnosis not present

## 2017-01-11 DIAGNOSIS — D631 Anemia in chronic kidney disease: Secondary | ICD-10-CM | POA: Diagnosis not present

## 2017-01-12 DIAGNOSIS — E559 Vitamin D deficiency, unspecified: Secondary | ICD-10-CM | POA: Diagnosis not present

## 2017-01-12 DIAGNOSIS — N2581 Secondary hyperparathyroidism of renal origin: Secondary | ICD-10-CM | POA: Diagnosis not present

## 2017-01-12 DIAGNOSIS — N186 End stage renal disease: Secondary | ICD-10-CM | POA: Diagnosis not present

## 2017-01-12 DIAGNOSIS — D631 Anemia in chronic kidney disease: Secondary | ICD-10-CM | POA: Diagnosis not present

## 2017-01-12 DIAGNOSIS — Z992 Dependence on renal dialysis: Secondary | ICD-10-CM | POA: Diagnosis not present

## 2017-01-13 DIAGNOSIS — N186 End stage renal disease: Secondary | ICD-10-CM | POA: Diagnosis not present

## 2017-01-13 DIAGNOSIS — N2581 Secondary hyperparathyroidism of renal origin: Secondary | ICD-10-CM | POA: Diagnosis not present

## 2017-01-13 DIAGNOSIS — D631 Anemia in chronic kidney disease: Secondary | ICD-10-CM | POA: Diagnosis not present

## 2017-01-13 DIAGNOSIS — E559 Vitamin D deficiency, unspecified: Secondary | ICD-10-CM | POA: Diagnosis not present

## 2017-01-13 DIAGNOSIS — Z992 Dependence on renal dialysis: Secondary | ICD-10-CM | POA: Diagnosis not present

## 2017-01-14 DIAGNOSIS — E559 Vitamin D deficiency, unspecified: Secondary | ICD-10-CM | POA: Diagnosis not present

## 2017-01-14 DIAGNOSIS — Z992 Dependence on renal dialysis: Secondary | ICD-10-CM | POA: Diagnosis not present

## 2017-01-14 DIAGNOSIS — N2581 Secondary hyperparathyroidism of renal origin: Secondary | ICD-10-CM | POA: Diagnosis not present

## 2017-01-14 DIAGNOSIS — D631 Anemia in chronic kidney disease: Secondary | ICD-10-CM | POA: Diagnosis not present

## 2017-01-14 DIAGNOSIS — N186 End stage renal disease: Secondary | ICD-10-CM | POA: Diagnosis not present

## 2017-01-15 DIAGNOSIS — N2581 Secondary hyperparathyroidism of renal origin: Secondary | ICD-10-CM | POA: Diagnosis not present

## 2017-01-15 DIAGNOSIS — N186 End stage renal disease: Secondary | ICD-10-CM | POA: Diagnosis not present

## 2017-01-15 DIAGNOSIS — Z992 Dependence on renal dialysis: Secondary | ICD-10-CM | POA: Diagnosis not present

## 2017-01-15 DIAGNOSIS — E559 Vitamin D deficiency, unspecified: Secondary | ICD-10-CM | POA: Diagnosis not present

## 2017-01-15 DIAGNOSIS — D631 Anemia in chronic kidney disease: Secondary | ICD-10-CM | POA: Diagnosis not present

## 2017-01-16 DIAGNOSIS — E559 Vitamin D deficiency, unspecified: Secondary | ICD-10-CM | POA: Diagnosis not present

## 2017-01-16 DIAGNOSIS — N186 End stage renal disease: Secondary | ICD-10-CM | POA: Diagnosis not present

## 2017-01-16 DIAGNOSIS — D631 Anemia in chronic kidney disease: Secondary | ICD-10-CM | POA: Diagnosis not present

## 2017-01-16 DIAGNOSIS — N2581 Secondary hyperparathyroidism of renal origin: Secondary | ICD-10-CM | POA: Diagnosis not present

## 2017-01-16 DIAGNOSIS — Z992 Dependence on renal dialysis: Secondary | ICD-10-CM | POA: Diagnosis not present

## 2017-01-17 DIAGNOSIS — N186 End stage renal disease: Secondary | ICD-10-CM | POA: Diagnosis not present

## 2017-01-17 DIAGNOSIS — E559 Vitamin D deficiency, unspecified: Secondary | ICD-10-CM | POA: Diagnosis not present

## 2017-01-17 DIAGNOSIS — Z992 Dependence on renal dialysis: Secondary | ICD-10-CM | POA: Diagnosis not present

## 2017-01-17 DIAGNOSIS — D631 Anemia in chronic kidney disease: Secondary | ICD-10-CM | POA: Diagnosis not present

## 2017-01-17 DIAGNOSIS — N2581 Secondary hyperparathyroidism of renal origin: Secondary | ICD-10-CM | POA: Diagnosis not present

## 2017-01-18 DIAGNOSIS — Z992 Dependence on renal dialysis: Secondary | ICD-10-CM | POA: Diagnosis not present

## 2017-01-18 DIAGNOSIS — D631 Anemia in chronic kidney disease: Secondary | ICD-10-CM | POA: Diagnosis not present

## 2017-01-18 DIAGNOSIS — N2581 Secondary hyperparathyroidism of renal origin: Secondary | ICD-10-CM | POA: Diagnosis not present

## 2017-01-18 DIAGNOSIS — N186 End stage renal disease: Secondary | ICD-10-CM | POA: Diagnosis not present

## 2017-01-18 DIAGNOSIS — E559 Vitamin D deficiency, unspecified: Secondary | ICD-10-CM | POA: Diagnosis not present

## 2017-01-19 DIAGNOSIS — N186 End stage renal disease: Secondary | ICD-10-CM | POA: Diagnosis not present

## 2017-01-19 DIAGNOSIS — Z992 Dependence on renal dialysis: Secondary | ICD-10-CM | POA: Diagnosis not present

## 2017-01-19 DIAGNOSIS — D631 Anemia in chronic kidney disease: Secondary | ICD-10-CM | POA: Diagnosis not present

## 2017-01-19 DIAGNOSIS — E559 Vitamin D deficiency, unspecified: Secondary | ICD-10-CM | POA: Diagnosis not present

## 2017-01-19 DIAGNOSIS — N2581 Secondary hyperparathyroidism of renal origin: Secondary | ICD-10-CM | POA: Diagnosis not present

## 2017-01-20 DIAGNOSIS — N186 End stage renal disease: Secondary | ICD-10-CM | POA: Diagnosis not present

## 2017-01-20 DIAGNOSIS — Z992 Dependence on renal dialysis: Secondary | ICD-10-CM | POA: Diagnosis not present

## 2017-01-20 DIAGNOSIS — E559 Vitamin D deficiency, unspecified: Secondary | ICD-10-CM | POA: Diagnosis not present

## 2017-01-20 DIAGNOSIS — N2581 Secondary hyperparathyroidism of renal origin: Secondary | ICD-10-CM | POA: Diagnosis not present

## 2017-01-20 DIAGNOSIS — D631 Anemia in chronic kidney disease: Secondary | ICD-10-CM | POA: Diagnosis not present

## 2017-01-21 DIAGNOSIS — D631 Anemia in chronic kidney disease: Secondary | ICD-10-CM | POA: Diagnosis not present

## 2017-01-21 DIAGNOSIS — N186 End stage renal disease: Secondary | ICD-10-CM | POA: Diagnosis not present

## 2017-01-21 DIAGNOSIS — N2581 Secondary hyperparathyroidism of renal origin: Secondary | ICD-10-CM | POA: Diagnosis not present

## 2017-01-21 DIAGNOSIS — Z992 Dependence on renal dialysis: Secondary | ICD-10-CM | POA: Diagnosis not present

## 2017-01-21 DIAGNOSIS — E559 Vitamin D deficiency, unspecified: Secondary | ICD-10-CM | POA: Diagnosis not present

## 2017-01-22 DIAGNOSIS — N2581 Secondary hyperparathyroidism of renal origin: Secondary | ICD-10-CM | POA: Diagnosis not present

## 2017-01-22 DIAGNOSIS — D631 Anemia in chronic kidney disease: Secondary | ICD-10-CM | POA: Diagnosis not present

## 2017-01-22 DIAGNOSIS — E559 Vitamin D deficiency, unspecified: Secondary | ICD-10-CM | POA: Diagnosis not present

## 2017-01-22 DIAGNOSIS — Z992 Dependence on renal dialysis: Secondary | ICD-10-CM | POA: Diagnosis not present

## 2017-01-22 DIAGNOSIS — N186 End stage renal disease: Secondary | ICD-10-CM | POA: Diagnosis not present

## 2017-01-23 DIAGNOSIS — N2581 Secondary hyperparathyroidism of renal origin: Secondary | ICD-10-CM | POA: Diagnosis not present

## 2017-01-23 DIAGNOSIS — E559 Vitamin D deficiency, unspecified: Secondary | ICD-10-CM | POA: Diagnosis not present

## 2017-01-23 DIAGNOSIS — Z992 Dependence on renal dialysis: Secondary | ICD-10-CM | POA: Diagnosis not present

## 2017-01-23 DIAGNOSIS — N186 End stage renal disease: Secondary | ICD-10-CM | POA: Diagnosis not present

## 2017-01-23 DIAGNOSIS — D631 Anemia in chronic kidney disease: Secondary | ICD-10-CM | POA: Diagnosis not present

## 2017-01-24 DIAGNOSIS — N2581 Secondary hyperparathyroidism of renal origin: Secondary | ICD-10-CM | POA: Diagnosis not present

## 2017-01-24 DIAGNOSIS — N186 End stage renal disease: Secondary | ICD-10-CM | POA: Diagnosis not present

## 2017-01-24 DIAGNOSIS — D631 Anemia in chronic kidney disease: Secondary | ICD-10-CM | POA: Diagnosis not present

## 2017-01-24 DIAGNOSIS — E559 Vitamin D deficiency, unspecified: Secondary | ICD-10-CM | POA: Diagnosis not present

## 2017-01-24 DIAGNOSIS — Z992 Dependence on renal dialysis: Secondary | ICD-10-CM | POA: Diagnosis not present

## 2017-01-25 DIAGNOSIS — N186 End stage renal disease: Secondary | ICD-10-CM | POA: Diagnosis not present

## 2017-01-25 DIAGNOSIS — D631 Anemia in chronic kidney disease: Secondary | ICD-10-CM | POA: Diagnosis not present

## 2017-01-25 DIAGNOSIS — E559 Vitamin D deficiency, unspecified: Secondary | ICD-10-CM | POA: Diagnosis not present

## 2017-01-25 DIAGNOSIS — Z992 Dependence on renal dialysis: Secondary | ICD-10-CM | POA: Diagnosis not present

## 2017-01-25 DIAGNOSIS — N2581 Secondary hyperparathyroidism of renal origin: Secondary | ICD-10-CM | POA: Diagnosis not present

## 2017-01-26 DIAGNOSIS — Z23 Encounter for immunization: Secondary | ICD-10-CM | POA: Diagnosis not present

## 2017-01-26 DIAGNOSIS — D631 Anemia in chronic kidney disease: Secondary | ICD-10-CM | POA: Diagnosis not present

## 2017-01-26 DIAGNOSIS — N186 End stage renal disease: Secondary | ICD-10-CM | POA: Diagnosis not present

## 2017-01-26 DIAGNOSIS — Z992 Dependence on renal dialysis: Secondary | ICD-10-CM | POA: Diagnosis not present

## 2017-01-26 DIAGNOSIS — N2581 Secondary hyperparathyroidism of renal origin: Secondary | ICD-10-CM | POA: Diagnosis not present

## 2017-01-27 DIAGNOSIS — Z992 Dependence on renal dialysis: Secondary | ICD-10-CM | POA: Diagnosis not present

## 2017-01-27 DIAGNOSIS — D631 Anemia in chronic kidney disease: Secondary | ICD-10-CM | POA: Diagnosis not present

## 2017-01-27 DIAGNOSIS — N186 End stage renal disease: Secondary | ICD-10-CM | POA: Diagnosis not present

## 2017-01-27 DIAGNOSIS — N2581 Secondary hyperparathyroidism of renal origin: Secondary | ICD-10-CM | POA: Diagnosis not present

## 2017-01-27 DIAGNOSIS — Z23 Encounter for immunization: Secondary | ICD-10-CM | POA: Diagnosis not present

## 2017-01-28 DIAGNOSIS — N2581 Secondary hyperparathyroidism of renal origin: Secondary | ICD-10-CM | POA: Diagnosis not present

## 2017-01-28 DIAGNOSIS — N186 End stage renal disease: Secondary | ICD-10-CM | POA: Diagnosis not present

## 2017-01-28 DIAGNOSIS — D631 Anemia in chronic kidney disease: Secondary | ICD-10-CM | POA: Diagnosis not present

## 2017-01-28 DIAGNOSIS — Z992 Dependence on renal dialysis: Secondary | ICD-10-CM | POA: Diagnosis not present

## 2017-01-28 DIAGNOSIS — Z23 Encounter for immunization: Secondary | ICD-10-CM | POA: Diagnosis not present

## 2017-01-29 DIAGNOSIS — N2581 Secondary hyperparathyroidism of renal origin: Secondary | ICD-10-CM | POA: Diagnosis not present

## 2017-01-29 DIAGNOSIS — N186 End stage renal disease: Secondary | ICD-10-CM | POA: Diagnosis not present

## 2017-01-29 DIAGNOSIS — Z23 Encounter for immunization: Secondary | ICD-10-CM | POA: Diagnosis not present

## 2017-01-29 DIAGNOSIS — Z992 Dependence on renal dialysis: Secondary | ICD-10-CM | POA: Diagnosis not present

## 2017-01-29 DIAGNOSIS — D631 Anemia in chronic kidney disease: Secondary | ICD-10-CM | POA: Diagnosis not present

## 2017-01-30 DIAGNOSIS — Z992 Dependence on renal dialysis: Secondary | ICD-10-CM | POA: Diagnosis not present

## 2017-01-30 DIAGNOSIS — N186 End stage renal disease: Secondary | ICD-10-CM | POA: Diagnosis not present

## 2017-01-30 DIAGNOSIS — Z23 Encounter for immunization: Secondary | ICD-10-CM | POA: Diagnosis not present

## 2017-01-30 DIAGNOSIS — D631 Anemia in chronic kidney disease: Secondary | ICD-10-CM | POA: Diagnosis not present

## 2017-01-30 DIAGNOSIS — N2581 Secondary hyperparathyroidism of renal origin: Secondary | ICD-10-CM | POA: Diagnosis not present

## 2017-01-31 DIAGNOSIS — N2581 Secondary hyperparathyroidism of renal origin: Secondary | ICD-10-CM | POA: Diagnosis not present

## 2017-01-31 DIAGNOSIS — Z23 Encounter for immunization: Secondary | ICD-10-CM | POA: Diagnosis not present

## 2017-01-31 DIAGNOSIS — N186 End stage renal disease: Secondary | ICD-10-CM | POA: Diagnosis not present

## 2017-01-31 DIAGNOSIS — Z992 Dependence on renal dialysis: Secondary | ICD-10-CM | POA: Diagnosis not present

## 2017-01-31 DIAGNOSIS — D631 Anemia in chronic kidney disease: Secondary | ICD-10-CM | POA: Diagnosis not present

## 2017-02-01 DIAGNOSIS — Z992 Dependence on renal dialysis: Secondary | ICD-10-CM | POA: Diagnosis not present

## 2017-02-01 DIAGNOSIS — D631 Anemia in chronic kidney disease: Secondary | ICD-10-CM | POA: Diagnosis not present

## 2017-02-01 DIAGNOSIS — N2581 Secondary hyperparathyroidism of renal origin: Secondary | ICD-10-CM | POA: Diagnosis not present

## 2017-02-01 DIAGNOSIS — N186 End stage renal disease: Secondary | ICD-10-CM | POA: Diagnosis not present

## 2017-02-01 DIAGNOSIS — Z23 Encounter for immunization: Secondary | ICD-10-CM | POA: Diagnosis not present

## 2017-02-02 DIAGNOSIS — Z23 Encounter for immunization: Secondary | ICD-10-CM | POA: Diagnosis not present

## 2017-02-02 DIAGNOSIS — N2581 Secondary hyperparathyroidism of renal origin: Secondary | ICD-10-CM | POA: Diagnosis not present

## 2017-02-02 DIAGNOSIS — Z992 Dependence on renal dialysis: Secondary | ICD-10-CM | POA: Diagnosis not present

## 2017-02-02 DIAGNOSIS — D631 Anemia in chronic kidney disease: Secondary | ICD-10-CM | POA: Diagnosis not present

## 2017-02-02 DIAGNOSIS — N186 End stage renal disease: Secondary | ICD-10-CM | POA: Diagnosis not present

## 2017-02-03 DIAGNOSIS — N2581 Secondary hyperparathyroidism of renal origin: Secondary | ICD-10-CM | POA: Diagnosis not present

## 2017-02-03 DIAGNOSIS — D631 Anemia in chronic kidney disease: Secondary | ICD-10-CM | POA: Diagnosis not present

## 2017-02-03 DIAGNOSIS — Z23 Encounter for immunization: Secondary | ICD-10-CM | POA: Diagnosis not present

## 2017-02-03 DIAGNOSIS — Z992 Dependence on renal dialysis: Secondary | ICD-10-CM | POA: Diagnosis not present

## 2017-02-03 DIAGNOSIS — N186 End stage renal disease: Secondary | ICD-10-CM | POA: Diagnosis not present

## 2017-02-04 DIAGNOSIS — N2581 Secondary hyperparathyroidism of renal origin: Secondary | ICD-10-CM | POA: Diagnosis not present

## 2017-02-04 DIAGNOSIS — Z992 Dependence on renal dialysis: Secondary | ICD-10-CM | POA: Diagnosis not present

## 2017-02-04 DIAGNOSIS — N186 End stage renal disease: Secondary | ICD-10-CM | POA: Diagnosis not present

## 2017-02-04 DIAGNOSIS — D631 Anemia in chronic kidney disease: Secondary | ICD-10-CM | POA: Diagnosis not present

## 2017-02-04 DIAGNOSIS — Z23 Encounter for immunization: Secondary | ICD-10-CM | POA: Diagnosis not present

## 2017-02-05 DIAGNOSIS — Z23 Encounter for immunization: Secondary | ICD-10-CM | POA: Diagnosis not present

## 2017-02-05 DIAGNOSIS — N186 End stage renal disease: Secondary | ICD-10-CM | POA: Diagnosis not present

## 2017-02-05 DIAGNOSIS — N2581 Secondary hyperparathyroidism of renal origin: Secondary | ICD-10-CM | POA: Diagnosis not present

## 2017-02-05 DIAGNOSIS — Z992 Dependence on renal dialysis: Secondary | ICD-10-CM | POA: Diagnosis not present

## 2017-02-05 DIAGNOSIS — D631 Anemia in chronic kidney disease: Secondary | ICD-10-CM | POA: Diagnosis not present

## 2017-02-06 DIAGNOSIS — N186 End stage renal disease: Secondary | ICD-10-CM | POA: Diagnosis not present

## 2017-02-06 DIAGNOSIS — Z23 Encounter for immunization: Secondary | ICD-10-CM | POA: Diagnosis not present

## 2017-02-06 DIAGNOSIS — Z992 Dependence on renal dialysis: Secondary | ICD-10-CM | POA: Diagnosis not present

## 2017-02-06 DIAGNOSIS — D631 Anemia in chronic kidney disease: Secondary | ICD-10-CM | POA: Diagnosis not present

## 2017-02-06 DIAGNOSIS — N2581 Secondary hyperparathyroidism of renal origin: Secondary | ICD-10-CM | POA: Diagnosis not present

## 2017-02-07 DIAGNOSIS — N2581 Secondary hyperparathyroidism of renal origin: Secondary | ICD-10-CM | POA: Diagnosis not present

## 2017-02-07 DIAGNOSIS — Z992 Dependence on renal dialysis: Secondary | ICD-10-CM | POA: Diagnosis not present

## 2017-02-07 DIAGNOSIS — N186 End stage renal disease: Secondary | ICD-10-CM | POA: Diagnosis not present

## 2017-02-07 DIAGNOSIS — D631 Anemia in chronic kidney disease: Secondary | ICD-10-CM | POA: Diagnosis not present

## 2017-02-07 DIAGNOSIS — Z23 Encounter for immunization: Secondary | ICD-10-CM | POA: Diagnosis not present

## 2017-02-08 DIAGNOSIS — Z23 Encounter for immunization: Secondary | ICD-10-CM | POA: Diagnosis not present

## 2017-02-08 DIAGNOSIS — Z992 Dependence on renal dialysis: Secondary | ICD-10-CM | POA: Diagnosis not present

## 2017-02-08 DIAGNOSIS — N186 End stage renal disease: Secondary | ICD-10-CM | POA: Diagnosis not present

## 2017-02-08 DIAGNOSIS — N2581 Secondary hyperparathyroidism of renal origin: Secondary | ICD-10-CM | POA: Diagnosis not present

## 2017-02-08 DIAGNOSIS — D631 Anemia in chronic kidney disease: Secondary | ICD-10-CM | POA: Diagnosis not present

## 2017-02-09 DIAGNOSIS — N186 End stage renal disease: Secondary | ICD-10-CM | POA: Diagnosis not present

## 2017-02-09 DIAGNOSIS — D631 Anemia in chronic kidney disease: Secondary | ICD-10-CM | POA: Diagnosis not present

## 2017-02-09 DIAGNOSIS — Z23 Encounter for immunization: Secondary | ICD-10-CM | POA: Diagnosis not present

## 2017-02-09 DIAGNOSIS — Z992 Dependence on renal dialysis: Secondary | ICD-10-CM | POA: Diagnosis not present

## 2017-02-09 DIAGNOSIS — N2581 Secondary hyperparathyroidism of renal origin: Secondary | ICD-10-CM | POA: Diagnosis not present

## 2017-02-10 DIAGNOSIS — Z23 Encounter for immunization: Secondary | ICD-10-CM | POA: Diagnosis not present

## 2017-02-10 DIAGNOSIS — N186 End stage renal disease: Secondary | ICD-10-CM | POA: Diagnosis not present

## 2017-02-10 DIAGNOSIS — Z992 Dependence on renal dialysis: Secondary | ICD-10-CM | POA: Diagnosis not present

## 2017-02-10 DIAGNOSIS — N2581 Secondary hyperparathyroidism of renal origin: Secondary | ICD-10-CM | POA: Diagnosis not present

## 2017-02-10 DIAGNOSIS — D631 Anemia in chronic kidney disease: Secondary | ICD-10-CM | POA: Diagnosis not present

## 2017-02-11 DIAGNOSIS — D631 Anemia in chronic kidney disease: Secondary | ICD-10-CM | POA: Diagnosis not present

## 2017-02-11 DIAGNOSIS — N186 End stage renal disease: Secondary | ICD-10-CM | POA: Diagnosis not present

## 2017-02-11 DIAGNOSIS — Z23 Encounter for immunization: Secondary | ICD-10-CM | POA: Diagnosis not present

## 2017-02-11 DIAGNOSIS — Z992 Dependence on renal dialysis: Secondary | ICD-10-CM | POA: Diagnosis not present

## 2017-02-11 DIAGNOSIS — N2581 Secondary hyperparathyroidism of renal origin: Secondary | ICD-10-CM | POA: Diagnosis not present

## 2017-02-12 DIAGNOSIS — Z23 Encounter for immunization: Secondary | ICD-10-CM | POA: Diagnosis not present

## 2017-02-12 DIAGNOSIS — D631 Anemia in chronic kidney disease: Secondary | ICD-10-CM | POA: Diagnosis not present

## 2017-02-12 DIAGNOSIS — N2581 Secondary hyperparathyroidism of renal origin: Secondary | ICD-10-CM | POA: Diagnosis not present

## 2017-02-12 DIAGNOSIS — Z992 Dependence on renal dialysis: Secondary | ICD-10-CM | POA: Diagnosis not present

## 2017-02-12 DIAGNOSIS — N186 End stage renal disease: Secondary | ICD-10-CM | POA: Diagnosis not present

## 2017-02-13 DIAGNOSIS — Z23 Encounter for immunization: Secondary | ICD-10-CM | POA: Diagnosis not present

## 2017-02-13 DIAGNOSIS — Z992 Dependence on renal dialysis: Secondary | ICD-10-CM | POA: Diagnosis not present

## 2017-02-13 DIAGNOSIS — D631 Anemia in chronic kidney disease: Secondary | ICD-10-CM | POA: Diagnosis not present

## 2017-02-13 DIAGNOSIS — N186 End stage renal disease: Secondary | ICD-10-CM | POA: Diagnosis not present

## 2017-02-13 DIAGNOSIS — N2581 Secondary hyperparathyroidism of renal origin: Secondary | ICD-10-CM | POA: Diagnosis not present

## 2017-02-14 DIAGNOSIS — Z23 Encounter for immunization: Secondary | ICD-10-CM | POA: Diagnosis not present

## 2017-02-14 DIAGNOSIS — Z992 Dependence on renal dialysis: Secondary | ICD-10-CM | POA: Diagnosis not present

## 2017-02-14 DIAGNOSIS — N2581 Secondary hyperparathyroidism of renal origin: Secondary | ICD-10-CM | POA: Diagnosis not present

## 2017-02-14 DIAGNOSIS — N186 End stage renal disease: Secondary | ICD-10-CM | POA: Diagnosis not present

## 2017-02-14 DIAGNOSIS — D631 Anemia in chronic kidney disease: Secondary | ICD-10-CM | POA: Diagnosis not present

## 2017-02-15 DIAGNOSIS — Z992 Dependence on renal dialysis: Secondary | ICD-10-CM | POA: Diagnosis not present

## 2017-02-15 DIAGNOSIS — D631 Anemia in chronic kidney disease: Secondary | ICD-10-CM | POA: Diagnosis not present

## 2017-02-15 DIAGNOSIS — Z23 Encounter for immunization: Secondary | ICD-10-CM | POA: Diagnosis not present

## 2017-02-15 DIAGNOSIS — N2581 Secondary hyperparathyroidism of renal origin: Secondary | ICD-10-CM | POA: Diagnosis not present

## 2017-02-15 DIAGNOSIS — N186 End stage renal disease: Secondary | ICD-10-CM | POA: Diagnosis not present

## 2017-02-16 DIAGNOSIS — Z23 Encounter for immunization: Secondary | ICD-10-CM | POA: Diagnosis not present

## 2017-02-16 DIAGNOSIS — Z992 Dependence on renal dialysis: Secondary | ICD-10-CM | POA: Diagnosis not present

## 2017-02-16 DIAGNOSIS — D631 Anemia in chronic kidney disease: Secondary | ICD-10-CM | POA: Diagnosis not present

## 2017-02-16 DIAGNOSIS — N186 End stage renal disease: Secondary | ICD-10-CM | POA: Diagnosis not present

## 2017-02-16 DIAGNOSIS — N2581 Secondary hyperparathyroidism of renal origin: Secondary | ICD-10-CM | POA: Diagnosis not present

## 2017-02-17 DIAGNOSIS — N2581 Secondary hyperparathyroidism of renal origin: Secondary | ICD-10-CM | POA: Diagnosis not present

## 2017-02-17 DIAGNOSIS — Z23 Encounter for immunization: Secondary | ICD-10-CM | POA: Diagnosis not present

## 2017-02-17 DIAGNOSIS — D631 Anemia in chronic kidney disease: Secondary | ICD-10-CM | POA: Diagnosis not present

## 2017-02-17 DIAGNOSIS — N186 End stage renal disease: Secondary | ICD-10-CM | POA: Diagnosis not present

## 2017-02-17 DIAGNOSIS — Z992 Dependence on renal dialysis: Secondary | ICD-10-CM | POA: Diagnosis not present

## 2017-02-18 DIAGNOSIS — N186 End stage renal disease: Secondary | ICD-10-CM | POA: Diagnosis not present

## 2017-02-18 DIAGNOSIS — Z23 Encounter for immunization: Secondary | ICD-10-CM | POA: Diagnosis not present

## 2017-02-18 DIAGNOSIS — Z992 Dependence on renal dialysis: Secondary | ICD-10-CM | POA: Diagnosis not present

## 2017-02-18 DIAGNOSIS — D631 Anemia in chronic kidney disease: Secondary | ICD-10-CM | POA: Diagnosis not present

## 2017-02-18 DIAGNOSIS — N2581 Secondary hyperparathyroidism of renal origin: Secondary | ICD-10-CM | POA: Diagnosis not present

## 2017-02-19 DIAGNOSIS — N2581 Secondary hyperparathyroidism of renal origin: Secondary | ICD-10-CM | POA: Diagnosis not present

## 2017-02-19 DIAGNOSIS — Z992 Dependence on renal dialysis: Secondary | ICD-10-CM | POA: Diagnosis not present

## 2017-02-19 DIAGNOSIS — N186 End stage renal disease: Secondary | ICD-10-CM | POA: Diagnosis not present

## 2017-02-19 DIAGNOSIS — Z23 Encounter for immunization: Secondary | ICD-10-CM | POA: Diagnosis not present

## 2017-02-19 DIAGNOSIS — D631 Anemia in chronic kidney disease: Secondary | ICD-10-CM | POA: Diagnosis not present

## 2017-02-20 DIAGNOSIS — N186 End stage renal disease: Secondary | ICD-10-CM | POA: Diagnosis not present

## 2017-02-20 DIAGNOSIS — Z23 Encounter for immunization: Secondary | ICD-10-CM | POA: Diagnosis not present

## 2017-02-20 DIAGNOSIS — Z992 Dependence on renal dialysis: Secondary | ICD-10-CM | POA: Diagnosis not present

## 2017-02-20 DIAGNOSIS — D631 Anemia in chronic kidney disease: Secondary | ICD-10-CM | POA: Diagnosis not present

## 2017-02-20 DIAGNOSIS — N2581 Secondary hyperparathyroidism of renal origin: Secondary | ICD-10-CM | POA: Diagnosis not present

## 2017-02-21 DIAGNOSIS — N2581 Secondary hyperparathyroidism of renal origin: Secondary | ICD-10-CM | POA: Diagnosis not present

## 2017-02-21 DIAGNOSIS — D631 Anemia in chronic kidney disease: Secondary | ICD-10-CM | POA: Diagnosis not present

## 2017-02-21 DIAGNOSIS — Z992 Dependence on renal dialysis: Secondary | ICD-10-CM | POA: Diagnosis not present

## 2017-02-21 DIAGNOSIS — N186 End stage renal disease: Secondary | ICD-10-CM | POA: Diagnosis not present

## 2017-02-21 DIAGNOSIS — Z23 Encounter for immunization: Secondary | ICD-10-CM | POA: Diagnosis not present

## 2017-02-22 DIAGNOSIS — N2581 Secondary hyperparathyroidism of renal origin: Secondary | ICD-10-CM | POA: Diagnosis not present

## 2017-02-22 DIAGNOSIS — D631 Anemia in chronic kidney disease: Secondary | ICD-10-CM | POA: Diagnosis not present

## 2017-02-22 DIAGNOSIS — N186 End stage renal disease: Secondary | ICD-10-CM | POA: Diagnosis not present

## 2017-02-22 DIAGNOSIS — Z992 Dependence on renal dialysis: Secondary | ICD-10-CM | POA: Diagnosis not present

## 2017-02-22 DIAGNOSIS — Z23 Encounter for immunization: Secondary | ICD-10-CM | POA: Diagnosis not present

## 2017-02-23 DIAGNOSIS — Z992 Dependence on renal dialysis: Secondary | ICD-10-CM | POA: Diagnosis not present

## 2017-02-23 DIAGNOSIS — D631 Anemia in chronic kidney disease: Secondary | ICD-10-CM | POA: Diagnosis not present

## 2017-02-23 DIAGNOSIS — N2581 Secondary hyperparathyroidism of renal origin: Secondary | ICD-10-CM | POA: Diagnosis not present

## 2017-02-23 DIAGNOSIS — N186 End stage renal disease: Secondary | ICD-10-CM | POA: Diagnosis not present

## 2017-02-23 DIAGNOSIS — Z23 Encounter for immunization: Secondary | ICD-10-CM | POA: Diagnosis not present

## 2017-02-24 DIAGNOSIS — N2581 Secondary hyperparathyroidism of renal origin: Secondary | ICD-10-CM | POA: Diagnosis not present

## 2017-02-24 DIAGNOSIS — Z992 Dependence on renal dialysis: Secondary | ICD-10-CM | POA: Diagnosis not present

## 2017-02-24 DIAGNOSIS — Z23 Encounter for immunization: Secondary | ICD-10-CM | POA: Diagnosis not present

## 2017-02-24 DIAGNOSIS — D631 Anemia in chronic kidney disease: Secondary | ICD-10-CM | POA: Diagnosis not present

## 2017-02-24 DIAGNOSIS — N186 End stage renal disease: Secondary | ICD-10-CM | POA: Diagnosis not present

## 2017-02-25 DIAGNOSIS — T85898A Other specified complication of other internal prosthetic devices, implants and grafts, initial encounter: Secondary | ICD-10-CM | POA: Diagnosis not present

## 2017-02-25 DIAGNOSIS — N186 End stage renal disease: Secondary | ICD-10-CM | POA: Diagnosis not present

## 2017-02-25 DIAGNOSIS — Z992 Dependence on renal dialysis: Secondary | ICD-10-CM | POA: Diagnosis not present

## 2017-02-25 DIAGNOSIS — D631 Anemia in chronic kidney disease: Secondary | ICD-10-CM | POA: Diagnosis not present

## 2017-02-25 DIAGNOSIS — D509 Iron deficiency anemia, unspecified: Secondary | ICD-10-CM | POA: Diagnosis not present

## 2017-02-25 DIAGNOSIS — N2581 Secondary hyperparathyroidism of renal origin: Secondary | ICD-10-CM | POA: Diagnosis not present

## 2017-02-26 DIAGNOSIS — T85898A Other specified complication of other internal prosthetic devices, implants and grafts, initial encounter: Secondary | ICD-10-CM | POA: Diagnosis not present

## 2017-02-26 DIAGNOSIS — D509 Iron deficiency anemia, unspecified: Secondary | ICD-10-CM | POA: Diagnosis not present

## 2017-02-26 DIAGNOSIS — N2581 Secondary hyperparathyroidism of renal origin: Secondary | ICD-10-CM | POA: Diagnosis not present

## 2017-02-26 DIAGNOSIS — N186 End stage renal disease: Secondary | ICD-10-CM | POA: Diagnosis not present

## 2017-02-26 DIAGNOSIS — Z992 Dependence on renal dialysis: Secondary | ICD-10-CM | POA: Diagnosis not present

## 2017-02-26 DIAGNOSIS — D631 Anemia in chronic kidney disease: Secondary | ICD-10-CM | POA: Diagnosis not present

## 2017-02-27 DIAGNOSIS — D631 Anemia in chronic kidney disease: Secondary | ICD-10-CM | POA: Diagnosis not present

## 2017-02-27 DIAGNOSIS — Z992 Dependence on renal dialysis: Secondary | ICD-10-CM | POA: Diagnosis not present

## 2017-02-27 DIAGNOSIS — N186 End stage renal disease: Secondary | ICD-10-CM | POA: Diagnosis not present

## 2017-02-27 DIAGNOSIS — N2581 Secondary hyperparathyroidism of renal origin: Secondary | ICD-10-CM | POA: Diagnosis not present

## 2017-02-27 DIAGNOSIS — D509 Iron deficiency anemia, unspecified: Secondary | ICD-10-CM | POA: Diagnosis not present

## 2017-02-27 DIAGNOSIS — T85898A Other specified complication of other internal prosthetic devices, implants and grafts, initial encounter: Secondary | ICD-10-CM | POA: Diagnosis not present

## 2017-02-28 DIAGNOSIS — D509 Iron deficiency anemia, unspecified: Secondary | ICD-10-CM | POA: Diagnosis not present

## 2017-02-28 DIAGNOSIS — D631 Anemia in chronic kidney disease: Secondary | ICD-10-CM | POA: Diagnosis not present

## 2017-02-28 DIAGNOSIS — N2581 Secondary hyperparathyroidism of renal origin: Secondary | ICD-10-CM | POA: Diagnosis not present

## 2017-02-28 DIAGNOSIS — T85898A Other specified complication of other internal prosthetic devices, implants and grafts, initial encounter: Secondary | ICD-10-CM | POA: Diagnosis not present

## 2017-02-28 DIAGNOSIS — N186 End stage renal disease: Secondary | ICD-10-CM | POA: Diagnosis not present

## 2017-02-28 DIAGNOSIS — Z992 Dependence on renal dialysis: Secondary | ICD-10-CM | POA: Diagnosis not present

## 2017-03-01 DIAGNOSIS — N2581 Secondary hyperparathyroidism of renal origin: Secondary | ICD-10-CM | POA: Diagnosis not present

## 2017-03-01 DIAGNOSIS — T85898A Other specified complication of other internal prosthetic devices, implants and grafts, initial encounter: Secondary | ICD-10-CM | POA: Diagnosis not present

## 2017-03-01 DIAGNOSIS — D509 Iron deficiency anemia, unspecified: Secondary | ICD-10-CM | POA: Diagnosis not present

## 2017-03-01 DIAGNOSIS — N186 End stage renal disease: Secondary | ICD-10-CM | POA: Diagnosis not present

## 2017-03-01 DIAGNOSIS — Z992 Dependence on renal dialysis: Secondary | ICD-10-CM | POA: Diagnosis not present

## 2017-03-01 DIAGNOSIS — D631 Anemia in chronic kidney disease: Secondary | ICD-10-CM | POA: Diagnosis not present

## 2017-03-02 DIAGNOSIS — D509 Iron deficiency anemia, unspecified: Secondary | ICD-10-CM | POA: Diagnosis not present

## 2017-03-02 DIAGNOSIS — T85898A Other specified complication of other internal prosthetic devices, implants and grafts, initial encounter: Secondary | ICD-10-CM | POA: Diagnosis not present

## 2017-03-02 DIAGNOSIS — N186 End stage renal disease: Secondary | ICD-10-CM | POA: Diagnosis not present

## 2017-03-02 DIAGNOSIS — Z992 Dependence on renal dialysis: Secondary | ICD-10-CM | POA: Diagnosis not present

## 2017-03-02 DIAGNOSIS — D631 Anemia in chronic kidney disease: Secondary | ICD-10-CM | POA: Diagnosis not present

## 2017-03-02 DIAGNOSIS — N2581 Secondary hyperparathyroidism of renal origin: Secondary | ICD-10-CM | POA: Diagnosis not present

## 2017-03-03 DIAGNOSIS — N186 End stage renal disease: Secondary | ICD-10-CM | POA: Diagnosis not present

## 2017-03-03 DIAGNOSIS — N2581 Secondary hyperparathyroidism of renal origin: Secondary | ICD-10-CM | POA: Diagnosis not present

## 2017-03-03 DIAGNOSIS — T85898A Other specified complication of other internal prosthetic devices, implants and grafts, initial encounter: Secondary | ICD-10-CM | POA: Diagnosis not present

## 2017-03-03 DIAGNOSIS — Z992 Dependence on renal dialysis: Secondary | ICD-10-CM | POA: Diagnosis not present

## 2017-03-03 DIAGNOSIS — D631 Anemia in chronic kidney disease: Secondary | ICD-10-CM | POA: Diagnosis not present

## 2017-03-03 DIAGNOSIS — D509 Iron deficiency anemia, unspecified: Secondary | ICD-10-CM | POA: Diagnosis not present

## 2017-03-04 DIAGNOSIS — N2581 Secondary hyperparathyroidism of renal origin: Secondary | ICD-10-CM | POA: Diagnosis not present

## 2017-03-04 DIAGNOSIS — D509 Iron deficiency anemia, unspecified: Secondary | ICD-10-CM | POA: Diagnosis not present

## 2017-03-04 DIAGNOSIS — D631 Anemia in chronic kidney disease: Secondary | ICD-10-CM | POA: Diagnosis not present

## 2017-03-04 DIAGNOSIS — N186 End stage renal disease: Secondary | ICD-10-CM | POA: Diagnosis not present

## 2017-03-04 DIAGNOSIS — Z992 Dependence on renal dialysis: Secondary | ICD-10-CM | POA: Diagnosis not present

## 2017-03-04 DIAGNOSIS — T85898A Other specified complication of other internal prosthetic devices, implants and grafts, initial encounter: Secondary | ICD-10-CM | POA: Diagnosis not present

## 2017-03-05 DIAGNOSIS — T85898A Other specified complication of other internal prosthetic devices, implants and grafts, initial encounter: Secondary | ICD-10-CM | POA: Diagnosis not present

## 2017-03-05 DIAGNOSIS — D631 Anemia in chronic kidney disease: Secondary | ICD-10-CM | POA: Diagnosis not present

## 2017-03-05 DIAGNOSIS — N186 End stage renal disease: Secondary | ICD-10-CM | POA: Diagnosis not present

## 2017-03-05 DIAGNOSIS — Z992 Dependence on renal dialysis: Secondary | ICD-10-CM | POA: Diagnosis not present

## 2017-03-05 DIAGNOSIS — N2581 Secondary hyperparathyroidism of renal origin: Secondary | ICD-10-CM | POA: Diagnosis not present

## 2017-03-05 DIAGNOSIS — D509 Iron deficiency anemia, unspecified: Secondary | ICD-10-CM | POA: Diagnosis not present

## 2017-03-06 DIAGNOSIS — D631 Anemia in chronic kidney disease: Secondary | ICD-10-CM | POA: Diagnosis not present

## 2017-03-06 DIAGNOSIS — Z992 Dependence on renal dialysis: Secondary | ICD-10-CM | POA: Diagnosis not present

## 2017-03-06 DIAGNOSIS — N186 End stage renal disease: Secondary | ICD-10-CM | POA: Diagnosis not present

## 2017-03-06 DIAGNOSIS — D509 Iron deficiency anemia, unspecified: Secondary | ICD-10-CM | POA: Diagnosis not present

## 2017-03-06 DIAGNOSIS — T85898A Other specified complication of other internal prosthetic devices, implants and grafts, initial encounter: Secondary | ICD-10-CM | POA: Diagnosis not present

## 2017-03-06 DIAGNOSIS — N2581 Secondary hyperparathyroidism of renal origin: Secondary | ICD-10-CM | POA: Diagnosis not present

## 2017-03-07 DIAGNOSIS — Z992 Dependence on renal dialysis: Secondary | ICD-10-CM | POA: Diagnosis not present

## 2017-03-07 DIAGNOSIS — D631 Anemia in chronic kidney disease: Secondary | ICD-10-CM | POA: Diagnosis not present

## 2017-03-07 DIAGNOSIS — D509 Iron deficiency anemia, unspecified: Secondary | ICD-10-CM | POA: Diagnosis not present

## 2017-03-07 DIAGNOSIS — T85898A Other specified complication of other internal prosthetic devices, implants and grafts, initial encounter: Secondary | ICD-10-CM | POA: Diagnosis not present

## 2017-03-07 DIAGNOSIS — N186 End stage renal disease: Secondary | ICD-10-CM | POA: Diagnosis not present

## 2017-03-07 DIAGNOSIS — N2581 Secondary hyperparathyroidism of renal origin: Secondary | ICD-10-CM | POA: Diagnosis not present

## 2017-03-08 DIAGNOSIS — D509 Iron deficiency anemia, unspecified: Secondary | ICD-10-CM | POA: Diagnosis not present

## 2017-03-08 DIAGNOSIS — Z992 Dependence on renal dialysis: Secondary | ICD-10-CM | POA: Diagnosis not present

## 2017-03-08 DIAGNOSIS — N2581 Secondary hyperparathyroidism of renal origin: Secondary | ICD-10-CM | POA: Diagnosis not present

## 2017-03-08 DIAGNOSIS — D631 Anemia in chronic kidney disease: Secondary | ICD-10-CM | POA: Diagnosis not present

## 2017-03-08 DIAGNOSIS — N186 End stage renal disease: Secondary | ICD-10-CM | POA: Diagnosis not present

## 2017-03-08 DIAGNOSIS — T85898A Other specified complication of other internal prosthetic devices, implants and grafts, initial encounter: Secondary | ICD-10-CM | POA: Diagnosis not present

## 2017-03-09 DIAGNOSIS — D509 Iron deficiency anemia, unspecified: Secondary | ICD-10-CM | POA: Diagnosis not present

## 2017-03-09 DIAGNOSIS — N186 End stage renal disease: Secondary | ICD-10-CM | POA: Diagnosis not present

## 2017-03-09 DIAGNOSIS — N2581 Secondary hyperparathyroidism of renal origin: Secondary | ICD-10-CM | POA: Diagnosis not present

## 2017-03-09 DIAGNOSIS — D631 Anemia in chronic kidney disease: Secondary | ICD-10-CM | POA: Diagnosis not present

## 2017-03-09 DIAGNOSIS — Z992 Dependence on renal dialysis: Secondary | ICD-10-CM | POA: Diagnosis not present

## 2017-03-09 DIAGNOSIS — T85898A Other specified complication of other internal prosthetic devices, implants and grafts, initial encounter: Secondary | ICD-10-CM | POA: Diagnosis not present

## 2017-03-10 DIAGNOSIS — D509 Iron deficiency anemia, unspecified: Secondary | ICD-10-CM | POA: Diagnosis not present

## 2017-03-10 DIAGNOSIS — N2581 Secondary hyperparathyroidism of renal origin: Secondary | ICD-10-CM | POA: Diagnosis not present

## 2017-03-10 DIAGNOSIS — N186 End stage renal disease: Secondary | ICD-10-CM | POA: Diagnosis not present

## 2017-03-10 DIAGNOSIS — T85898A Other specified complication of other internal prosthetic devices, implants and grafts, initial encounter: Secondary | ICD-10-CM | POA: Diagnosis not present

## 2017-03-10 DIAGNOSIS — Z992 Dependence on renal dialysis: Secondary | ICD-10-CM | POA: Diagnosis not present

## 2017-03-10 DIAGNOSIS — D631 Anemia in chronic kidney disease: Secondary | ICD-10-CM | POA: Diagnosis not present

## 2017-03-11 DIAGNOSIS — Z992 Dependence on renal dialysis: Secondary | ICD-10-CM | POA: Diagnosis not present

## 2017-03-11 DIAGNOSIS — D509 Iron deficiency anemia, unspecified: Secondary | ICD-10-CM | POA: Diagnosis not present

## 2017-03-11 DIAGNOSIS — T85898A Other specified complication of other internal prosthetic devices, implants and grafts, initial encounter: Secondary | ICD-10-CM | POA: Diagnosis not present

## 2017-03-11 DIAGNOSIS — N186 End stage renal disease: Secondary | ICD-10-CM | POA: Diagnosis not present

## 2017-03-11 DIAGNOSIS — N2581 Secondary hyperparathyroidism of renal origin: Secondary | ICD-10-CM | POA: Diagnosis not present

## 2017-03-11 DIAGNOSIS — D631 Anemia in chronic kidney disease: Secondary | ICD-10-CM | POA: Diagnosis not present

## 2017-03-12 DIAGNOSIS — T85898A Other specified complication of other internal prosthetic devices, implants and grafts, initial encounter: Secondary | ICD-10-CM | POA: Diagnosis not present

## 2017-03-12 DIAGNOSIS — D631 Anemia in chronic kidney disease: Secondary | ICD-10-CM | POA: Diagnosis not present

## 2017-03-12 DIAGNOSIS — D509 Iron deficiency anemia, unspecified: Secondary | ICD-10-CM | POA: Diagnosis not present

## 2017-03-12 DIAGNOSIS — Z992 Dependence on renal dialysis: Secondary | ICD-10-CM | POA: Diagnosis not present

## 2017-03-12 DIAGNOSIS — N186 End stage renal disease: Secondary | ICD-10-CM | POA: Diagnosis not present

## 2017-03-12 DIAGNOSIS — N2581 Secondary hyperparathyroidism of renal origin: Secondary | ICD-10-CM | POA: Diagnosis not present

## 2017-03-13 DIAGNOSIS — N186 End stage renal disease: Secondary | ICD-10-CM | POA: Diagnosis not present

## 2017-03-13 DIAGNOSIS — Z992 Dependence on renal dialysis: Secondary | ICD-10-CM | POA: Diagnosis not present

## 2017-03-13 DIAGNOSIS — D509 Iron deficiency anemia, unspecified: Secondary | ICD-10-CM | POA: Diagnosis not present

## 2017-03-13 DIAGNOSIS — T85898A Other specified complication of other internal prosthetic devices, implants and grafts, initial encounter: Secondary | ICD-10-CM | POA: Diagnosis not present

## 2017-03-13 DIAGNOSIS — D631 Anemia in chronic kidney disease: Secondary | ICD-10-CM | POA: Diagnosis not present

## 2017-03-13 DIAGNOSIS — N2581 Secondary hyperparathyroidism of renal origin: Secondary | ICD-10-CM | POA: Diagnosis not present

## 2017-03-14 DIAGNOSIS — N2581 Secondary hyperparathyroidism of renal origin: Secondary | ICD-10-CM | POA: Diagnosis not present

## 2017-03-14 DIAGNOSIS — D631 Anemia in chronic kidney disease: Secondary | ICD-10-CM | POA: Diagnosis not present

## 2017-03-14 DIAGNOSIS — Z992 Dependence on renal dialysis: Secondary | ICD-10-CM | POA: Diagnosis not present

## 2017-03-14 DIAGNOSIS — N186 End stage renal disease: Secondary | ICD-10-CM | POA: Diagnosis not present

## 2017-03-14 DIAGNOSIS — T85898A Other specified complication of other internal prosthetic devices, implants and grafts, initial encounter: Secondary | ICD-10-CM | POA: Diagnosis not present

## 2017-03-14 DIAGNOSIS — D509 Iron deficiency anemia, unspecified: Secondary | ICD-10-CM | POA: Diagnosis not present

## 2017-03-15 DIAGNOSIS — N2581 Secondary hyperparathyroidism of renal origin: Secondary | ICD-10-CM | POA: Diagnosis not present

## 2017-03-15 DIAGNOSIS — D631 Anemia in chronic kidney disease: Secondary | ICD-10-CM | POA: Diagnosis not present

## 2017-03-15 DIAGNOSIS — T85898A Other specified complication of other internal prosthetic devices, implants and grafts, initial encounter: Secondary | ICD-10-CM | POA: Diagnosis not present

## 2017-03-15 DIAGNOSIS — D509 Iron deficiency anemia, unspecified: Secondary | ICD-10-CM | POA: Diagnosis not present

## 2017-03-15 DIAGNOSIS — Z992 Dependence on renal dialysis: Secondary | ICD-10-CM | POA: Diagnosis not present

## 2017-03-15 DIAGNOSIS — N186 End stage renal disease: Secondary | ICD-10-CM | POA: Diagnosis not present

## 2017-03-16 DIAGNOSIS — Z992 Dependence on renal dialysis: Secondary | ICD-10-CM | POA: Diagnosis not present

## 2017-03-16 DIAGNOSIS — D509 Iron deficiency anemia, unspecified: Secondary | ICD-10-CM | POA: Diagnosis not present

## 2017-03-16 DIAGNOSIS — D631 Anemia in chronic kidney disease: Secondary | ICD-10-CM | POA: Diagnosis not present

## 2017-03-16 DIAGNOSIS — T85898A Other specified complication of other internal prosthetic devices, implants and grafts, initial encounter: Secondary | ICD-10-CM | POA: Diagnosis not present

## 2017-03-16 DIAGNOSIS — N2581 Secondary hyperparathyroidism of renal origin: Secondary | ICD-10-CM | POA: Diagnosis not present

## 2017-03-16 DIAGNOSIS — N186 End stage renal disease: Secondary | ICD-10-CM | POA: Diagnosis not present

## 2017-03-17 DIAGNOSIS — T85898A Other specified complication of other internal prosthetic devices, implants and grafts, initial encounter: Secondary | ICD-10-CM | POA: Diagnosis not present

## 2017-03-17 DIAGNOSIS — D509 Iron deficiency anemia, unspecified: Secondary | ICD-10-CM | POA: Diagnosis not present

## 2017-03-17 DIAGNOSIS — N2581 Secondary hyperparathyroidism of renal origin: Secondary | ICD-10-CM | POA: Diagnosis not present

## 2017-03-17 DIAGNOSIS — N186 End stage renal disease: Secondary | ICD-10-CM | POA: Diagnosis not present

## 2017-03-17 DIAGNOSIS — D631 Anemia in chronic kidney disease: Secondary | ICD-10-CM | POA: Diagnosis not present

## 2017-03-17 DIAGNOSIS — Z992 Dependence on renal dialysis: Secondary | ICD-10-CM | POA: Diagnosis not present

## 2017-03-18 DIAGNOSIS — N2581 Secondary hyperparathyroidism of renal origin: Secondary | ICD-10-CM | POA: Diagnosis not present

## 2017-03-18 DIAGNOSIS — D509 Iron deficiency anemia, unspecified: Secondary | ICD-10-CM | POA: Diagnosis not present

## 2017-03-18 DIAGNOSIS — N186 End stage renal disease: Secondary | ICD-10-CM | POA: Diagnosis not present

## 2017-03-18 DIAGNOSIS — D631 Anemia in chronic kidney disease: Secondary | ICD-10-CM | POA: Diagnosis not present

## 2017-03-18 DIAGNOSIS — Z992 Dependence on renal dialysis: Secondary | ICD-10-CM | POA: Diagnosis not present

## 2017-03-18 DIAGNOSIS — T85898A Other specified complication of other internal prosthetic devices, implants and grafts, initial encounter: Secondary | ICD-10-CM | POA: Diagnosis not present

## 2017-03-19 DIAGNOSIS — N186 End stage renal disease: Secondary | ICD-10-CM | POA: Diagnosis not present

## 2017-03-19 DIAGNOSIS — Z992 Dependence on renal dialysis: Secondary | ICD-10-CM | POA: Diagnosis not present

## 2017-03-19 DIAGNOSIS — N2581 Secondary hyperparathyroidism of renal origin: Secondary | ICD-10-CM | POA: Diagnosis not present

## 2017-03-19 DIAGNOSIS — T85898A Other specified complication of other internal prosthetic devices, implants and grafts, initial encounter: Secondary | ICD-10-CM | POA: Diagnosis not present

## 2017-03-19 DIAGNOSIS — D631 Anemia in chronic kidney disease: Secondary | ICD-10-CM | POA: Diagnosis not present

## 2017-03-19 DIAGNOSIS — D509 Iron deficiency anemia, unspecified: Secondary | ICD-10-CM | POA: Diagnosis not present

## 2017-03-20 DIAGNOSIS — T85898A Other specified complication of other internal prosthetic devices, implants and grafts, initial encounter: Secondary | ICD-10-CM | POA: Diagnosis not present

## 2017-03-20 DIAGNOSIS — D631 Anemia in chronic kidney disease: Secondary | ICD-10-CM | POA: Diagnosis not present

## 2017-03-20 DIAGNOSIS — Z992 Dependence on renal dialysis: Secondary | ICD-10-CM | POA: Diagnosis not present

## 2017-03-20 DIAGNOSIS — N2581 Secondary hyperparathyroidism of renal origin: Secondary | ICD-10-CM | POA: Diagnosis not present

## 2017-03-20 DIAGNOSIS — D509 Iron deficiency anemia, unspecified: Secondary | ICD-10-CM | POA: Diagnosis not present

## 2017-03-20 DIAGNOSIS — N186 End stage renal disease: Secondary | ICD-10-CM | POA: Diagnosis not present

## 2017-03-21 DIAGNOSIS — N186 End stage renal disease: Secondary | ICD-10-CM | POA: Diagnosis not present

## 2017-03-21 DIAGNOSIS — N2581 Secondary hyperparathyroidism of renal origin: Secondary | ICD-10-CM | POA: Diagnosis not present

## 2017-03-21 DIAGNOSIS — T85898A Other specified complication of other internal prosthetic devices, implants and grafts, initial encounter: Secondary | ICD-10-CM | POA: Diagnosis not present

## 2017-03-21 DIAGNOSIS — D631 Anemia in chronic kidney disease: Secondary | ICD-10-CM | POA: Diagnosis not present

## 2017-03-21 DIAGNOSIS — D509 Iron deficiency anemia, unspecified: Secondary | ICD-10-CM | POA: Diagnosis not present

## 2017-03-21 DIAGNOSIS — Z992 Dependence on renal dialysis: Secondary | ICD-10-CM | POA: Diagnosis not present

## 2017-03-22 DIAGNOSIS — N186 End stage renal disease: Secondary | ICD-10-CM | POA: Diagnosis not present

## 2017-03-22 DIAGNOSIS — D631 Anemia in chronic kidney disease: Secondary | ICD-10-CM | POA: Diagnosis not present

## 2017-03-22 DIAGNOSIS — N2581 Secondary hyperparathyroidism of renal origin: Secondary | ICD-10-CM | POA: Diagnosis not present

## 2017-03-22 DIAGNOSIS — D509 Iron deficiency anemia, unspecified: Secondary | ICD-10-CM | POA: Diagnosis not present

## 2017-03-22 DIAGNOSIS — T85898A Other specified complication of other internal prosthetic devices, implants and grafts, initial encounter: Secondary | ICD-10-CM | POA: Diagnosis not present

## 2017-03-22 DIAGNOSIS — Z992 Dependence on renal dialysis: Secondary | ICD-10-CM | POA: Diagnosis not present

## 2017-03-23 DIAGNOSIS — D509 Iron deficiency anemia, unspecified: Secondary | ICD-10-CM | POA: Diagnosis not present

## 2017-03-23 DIAGNOSIS — T85898A Other specified complication of other internal prosthetic devices, implants and grafts, initial encounter: Secondary | ICD-10-CM | POA: Diagnosis not present

## 2017-03-23 DIAGNOSIS — Z992 Dependence on renal dialysis: Secondary | ICD-10-CM | POA: Diagnosis not present

## 2017-03-23 DIAGNOSIS — N2581 Secondary hyperparathyroidism of renal origin: Secondary | ICD-10-CM | POA: Diagnosis not present

## 2017-03-23 DIAGNOSIS — D631 Anemia in chronic kidney disease: Secondary | ICD-10-CM | POA: Diagnosis not present

## 2017-03-23 DIAGNOSIS — N186 End stage renal disease: Secondary | ICD-10-CM | POA: Diagnosis not present

## 2017-03-24 DIAGNOSIS — N2581 Secondary hyperparathyroidism of renal origin: Secondary | ICD-10-CM | POA: Diagnosis not present

## 2017-03-24 DIAGNOSIS — Z992 Dependence on renal dialysis: Secondary | ICD-10-CM | POA: Diagnosis not present

## 2017-03-24 DIAGNOSIS — D631 Anemia in chronic kidney disease: Secondary | ICD-10-CM | POA: Diagnosis not present

## 2017-03-24 DIAGNOSIS — D509 Iron deficiency anemia, unspecified: Secondary | ICD-10-CM | POA: Diagnosis not present

## 2017-03-24 DIAGNOSIS — T85898A Other specified complication of other internal prosthetic devices, implants and grafts, initial encounter: Secondary | ICD-10-CM | POA: Diagnosis not present

## 2017-03-24 DIAGNOSIS — N186 End stage renal disease: Secondary | ICD-10-CM | POA: Diagnosis not present

## 2017-03-25 DIAGNOSIS — D509 Iron deficiency anemia, unspecified: Secondary | ICD-10-CM | POA: Diagnosis not present

## 2017-03-25 DIAGNOSIS — N186 End stage renal disease: Secondary | ICD-10-CM | POA: Diagnosis not present

## 2017-03-25 DIAGNOSIS — D631 Anemia in chronic kidney disease: Secondary | ICD-10-CM | POA: Diagnosis not present

## 2017-03-25 DIAGNOSIS — N2581 Secondary hyperparathyroidism of renal origin: Secondary | ICD-10-CM | POA: Diagnosis not present

## 2017-03-25 DIAGNOSIS — T85898A Other specified complication of other internal prosthetic devices, implants and grafts, initial encounter: Secondary | ICD-10-CM | POA: Diagnosis not present

## 2017-03-25 DIAGNOSIS — Z992 Dependence on renal dialysis: Secondary | ICD-10-CM | POA: Diagnosis not present

## 2017-03-26 DIAGNOSIS — D631 Anemia in chronic kidney disease: Secondary | ICD-10-CM | POA: Diagnosis not present

## 2017-03-26 DIAGNOSIS — T85898A Other specified complication of other internal prosthetic devices, implants and grafts, initial encounter: Secondary | ICD-10-CM | POA: Diagnosis not present

## 2017-03-26 DIAGNOSIS — D509 Iron deficiency anemia, unspecified: Secondary | ICD-10-CM | POA: Diagnosis not present

## 2017-03-26 DIAGNOSIS — N2581 Secondary hyperparathyroidism of renal origin: Secondary | ICD-10-CM | POA: Diagnosis not present

## 2017-03-26 DIAGNOSIS — Z992 Dependence on renal dialysis: Secondary | ICD-10-CM | POA: Diagnosis not present

## 2017-03-26 DIAGNOSIS — N186 End stage renal disease: Secondary | ICD-10-CM | POA: Diagnosis not present

## 2017-03-27 DIAGNOSIS — T85898A Other specified complication of other internal prosthetic devices, implants and grafts, initial encounter: Secondary | ICD-10-CM | POA: Diagnosis not present

## 2017-03-27 DIAGNOSIS — N186 End stage renal disease: Secondary | ICD-10-CM | POA: Diagnosis not present

## 2017-03-27 DIAGNOSIS — Z992 Dependence on renal dialysis: Secondary | ICD-10-CM | POA: Diagnosis not present

## 2017-03-27 DIAGNOSIS — D631 Anemia in chronic kidney disease: Secondary | ICD-10-CM | POA: Diagnosis not present

## 2017-03-27 DIAGNOSIS — N2581 Secondary hyperparathyroidism of renal origin: Secondary | ICD-10-CM | POA: Diagnosis not present

## 2017-03-27 DIAGNOSIS — D509 Iron deficiency anemia, unspecified: Secondary | ICD-10-CM | POA: Diagnosis not present

## 2017-03-28 DIAGNOSIS — N2581 Secondary hyperparathyroidism of renal origin: Secondary | ICD-10-CM | POA: Diagnosis not present

## 2017-03-28 DIAGNOSIS — E559 Vitamin D deficiency, unspecified: Secondary | ICD-10-CM | POA: Diagnosis not present

## 2017-03-28 DIAGNOSIS — N186 End stage renal disease: Secondary | ICD-10-CM | POA: Diagnosis not present

## 2017-03-28 DIAGNOSIS — Z992 Dependence on renal dialysis: Secondary | ICD-10-CM | POA: Diagnosis not present

## 2017-03-28 DIAGNOSIS — D631 Anemia in chronic kidney disease: Secondary | ICD-10-CM | POA: Diagnosis not present

## 2017-03-29 DIAGNOSIS — D631 Anemia in chronic kidney disease: Secondary | ICD-10-CM | POA: Diagnosis not present

## 2017-03-29 DIAGNOSIS — E559 Vitamin D deficiency, unspecified: Secondary | ICD-10-CM | POA: Diagnosis not present

## 2017-03-29 DIAGNOSIS — N186 End stage renal disease: Secondary | ICD-10-CM | POA: Diagnosis not present

## 2017-03-29 DIAGNOSIS — N2581 Secondary hyperparathyroidism of renal origin: Secondary | ICD-10-CM | POA: Diagnosis not present

## 2017-03-29 DIAGNOSIS — Z992 Dependence on renal dialysis: Secondary | ICD-10-CM | POA: Diagnosis not present

## 2017-03-30 DIAGNOSIS — D631 Anemia in chronic kidney disease: Secondary | ICD-10-CM | POA: Diagnosis not present

## 2017-03-30 DIAGNOSIS — E559 Vitamin D deficiency, unspecified: Secondary | ICD-10-CM | POA: Diagnosis not present

## 2017-03-30 DIAGNOSIS — Z992 Dependence on renal dialysis: Secondary | ICD-10-CM | POA: Diagnosis not present

## 2017-03-30 DIAGNOSIS — N2581 Secondary hyperparathyroidism of renal origin: Secondary | ICD-10-CM | POA: Diagnosis not present

## 2017-03-30 DIAGNOSIS — N186 End stage renal disease: Secondary | ICD-10-CM | POA: Diagnosis not present

## 2017-03-31 DIAGNOSIS — N186 End stage renal disease: Secondary | ICD-10-CM | POA: Diagnosis not present

## 2017-03-31 DIAGNOSIS — E559 Vitamin D deficiency, unspecified: Secondary | ICD-10-CM | POA: Diagnosis not present

## 2017-03-31 DIAGNOSIS — Z992 Dependence on renal dialysis: Secondary | ICD-10-CM | POA: Diagnosis not present

## 2017-03-31 DIAGNOSIS — D631 Anemia in chronic kidney disease: Secondary | ICD-10-CM | POA: Diagnosis not present

## 2017-03-31 DIAGNOSIS — N2581 Secondary hyperparathyroidism of renal origin: Secondary | ICD-10-CM | POA: Diagnosis not present

## 2017-04-01 DIAGNOSIS — N186 End stage renal disease: Secondary | ICD-10-CM | POA: Diagnosis not present

## 2017-04-01 DIAGNOSIS — Z992 Dependence on renal dialysis: Secondary | ICD-10-CM | POA: Diagnosis not present

## 2017-04-01 DIAGNOSIS — N2581 Secondary hyperparathyroidism of renal origin: Secondary | ICD-10-CM | POA: Diagnosis not present

## 2017-04-01 DIAGNOSIS — D631 Anemia in chronic kidney disease: Secondary | ICD-10-CM | POA: Diagnosis not present

## 2017-04-01 DIAGNOSIS — E559 Vitamin D deficiency, unspecified: Secondary | ICD-10-CM | POA: Diagnosis not present

## 2017-04-02 DIAGNOSIS — N2581 Secondary hyperparathyroidism of renal origin: Secondary | ICD-10-CM | POA: Diagnosis not present

## 2017-04-02 DIAGNOSIS — Z992 Dependence on renal dialysis: Secondary | ICD-10-CM | POA: Diagnosis not present

## 2017-04-02 DIAGNOSIS — N186 End stage renal disease: Secondary | ICD-10-CM | POA: Diagnosis not present

## 2017-04-02 DIAGNOSIS — D631 Anemia in chronic kidney disease: Secondary | ICD-10-CM | POA: Diagnosis not present

## 2017-04-02 DIAGNOSIS — E559 Vitamin D deficiency, unspecified: Secondary | ICD-10-CM | POA: Diagnosis not present

## 2017-04-03 DIAGNOSIS — N2581 Secondary hyperparathyroidism of renal origin: Secondary | ICD-10-CM | POA: Diagnosis not present

## 2017-04-03 DIAGNOSIS — Z992 Dependence on renal dialysis: Secondary | ICD-10-CM | POA: Diagnosis not present

## 2017-04-03 DIAGNOSIS — N186 End stage renal disease: Secondary | ICD-10-CM | POA: Diagnosis not present

## 2017-04-03 DIAGNOSIS — D631 Anemia in chronic kidney disease: Secondary | ICD-10-CM | POA: Diagnosis not present

## 2017-04-03 DIAGNOSIS — E559 Vitamin D deficiency, unspecified: Secondary | ICD-10-CM | POA: Diagnosis not present

## 2017-04-04 DIAGNOSIS — D631 Anemia in chronic kidney disease: Secondary | ICD-10-CM | POA: Diagnosis not present

## 2017-04-04 DIAGNOSIS — N186 End stage renal disease: Secondary | ICD-10-CM | POA: Diagnosis not present

## 2017-04-04 DIAGNOSIS — Z992 Dependence on renal dialysis: Secondary | ICD-10-CM | POA: Diagnosis not present

## 2017-04-04 DIAGNOSIS — N2581 Secondary hyperparathyroidism of renal origin: Secondary | ICD-10-CM | POA: Diagnosis not present

## 2017-04-04 DIAGNOSIS — E559 Vitamin D deficiency, unspecified: Secondary | ICD-10-CM | POA: Diagnosis not present

## 2017-04-05 DIAGNOSIS — N2581 Secondary hyperparathyroidism of renal origin: Secondary | ICD-10-CM | POA: Diagnosis not present

## 2017-04-05 DIAGNOSIS — Z794 Long term (current) use of insulin: Secondary | ICD-10-CM | POA: Diagnosis not present

## 2017-04-05 DIAGNOSIS — E559 Vitamin D deficiency, unspecified: Secondary | ICD-10-CM | POA: Diagnosis not present

## 2017-04-05 DIAGNOSIS — E119 Type 2 diabetes mellitus without complications: Secondary | ICD-10-CM | POA: Diagnosis not present

## 2017-04-05 DIAGNOSIS — D631 Anemia in chronic kidney disease: Secondary | ICD-10-CM | POA: Diagnosis not present

## 2017-04-05 DIAGNOSIS — Z992 Dependence on renal dialysis: Secondary | ICD-10-CM | POA: Diagnosis not present

## 2017-04-05 DIAGNOSIS — N186 End stage renal disease: Secondary | ICD-10-CM | POA: Diagnosis not present

## 2017-04-06 DIAGNOSIS — D631 Anemia in chronic kidney disease: Secondary | ICD-10-CM | POA: Diagnosis not present

## 2017-04-06 DIAGNOSIS — N2581 Secondary hyperparathyroidism of renal origin: Secondary | ICD-10-CM | POA: Diagnosis not present

## 2017-04-06 DIAGNOSIS — E559 Vitamin D deficiency, unspecified: Secondary | ICD-10-CM | POA: Diagnosis not present

## 2017-04-06 DIAGNOSIS — Z992 Dependence on renal dialysis: Secondary | ICD-10-CM | POA: Diagnosis not present

## 2017-04-06 DIAGNOSIS — N186 End stage renal disease: Secondary | ICD-10-CM | POA: Diagnosis not present

## 2017-04-07 DIAGNOSIS — N186 End stage renal disease: Secondary | ICD-10-CM | POA: Diagnosis not present

## 2017-04-07 DIAGNOSIS — N2581 Secondary hyperparathyroidism of renal origin: Secondary | ICD-10-CM | POA: Diagnosis not present

## 2017-04-07 DIAGNOSIS — Z992 Dependence on renal dialysis: Secondary | ICD-10-CM | POA: Diagnosis not present

## 2017-04-07 DIAGNOSIS — D631 Anemia in chronic kidney disease: Secondary | ICD-10-CM | POA: Diagnosis not present

## 2017-04-07 DIAGNOSIS — E559 Vitamin D deficiency, unspecified: Secondary | ICD-10-CM | POA: Diagnosis not present

## 2017-04-08 DIAGNOSIS — N186 End stage renal disease: Secondary | ICD-10-CM | POA: Diagnosis not present

## 2017-04-08 DIAGNOSIS — E559 Vitamin D deficiency, unspecified: Secondary | ICD-10-CM | POA: Diagnosis not present

## 2017-04-08 DIAGNOSIS — D631 Anemia in chronic kidney disease: Secondary | ICD-10-CM | POA: Diagnosis not present

## 2017-04-08 DIAGNOSIS — N2581 Secondary hyperparathyroidism of renal origin: Secondary | ICD-10-CM | POA: Diagnosis not present

## 2017-04-08 DIAGNOSIS — Z992 Dependence on renal dialysis: Secondary | ICD-10-CM | POA: Diagnosis not present

## 2017-04-09 DIAGNOSIS — D631 Anemia in chronic kidney disease: Secondary | ICD-10-CM | POA: Diagnosis not present

## 2017-04-09 DIAGNOSIS — N2581 Secondary hyperparathyroidism of renal origin: Secondary | ICD-10-CM | POA: Diagnosis not present

## 2017-04-09 DIAGNOSIS — N186 End stage renal disease: Secondary | ICD-10-CM | POA: Diagnosis not present

## 2017-04-09 DIAGNOSIS — E559 Vitamin D deficiency, unspecified: Secondary | ICD-10-CM | POA: Diagnosis not present

## 2017-04-09 DIAGNOSIS — Z992 Dependence on renal dialysis: Secondary | ICD-10-CM | POA: Diagnosis not present

## 2017-04-10 DIAGNOSIS — N186 End stage renal disease: Secondary | ICD-10-CM | POA: Diagnosis not present

## 2017-04-10 DIAGNOSIS — E559 Vitamin D deficiency, unspecified: Secondary | ICD-10-CM | POA: Diagnosis not present

## 2017-04-10 DIAGNOSIS — N2581 Secondary hyperparathyroidism of renal origin: Secondary | ICD-10-CM | POA: Diagnosis not present

## 2017-04-10 DIAGNOSIS — Z992 Dependence on renal dialysis: Secondary | ICD-10-CM | POA: Diagnosis not present

## 2017-04-10 DIAGNOSIS — D631 Anemia in chronic kidney disease: Secondary | ICD-10-CM | POA: Diagnosis not present

## 2017-04-11 DIAGNOSIS — Z992 Dependence on renal dialysis: Secondary | ICD-10-CM | POA: Diagnosis not present

## 2017-04-11 DIAGNOSIS — E559 Vitamin D deficiency, unspecified: Secondary | ICD-10-CM | POA: Diagnosis not present

## 2017-04-11 DIAGNOSIS — N2581 Secondary hyperparathyroidism of renal origin: Secondary | ICD-10-CM | POA: Diagnosis not present

## 2017-04-11 DIAGNOSIS — N186 End stage renal disease: Secondary | ICD-10-CM | POA: Diagnosis not present

## 2017-04-11 DIAGNOSIS — D631 Anemia in chronic kidney disease: Secondary | ICD-10-CM | POA: Diagnosis not present

## 2017-04-12 DIAGNOSIS — E559 Vitamin D deficiency, unspecified: Secondary | ICD-10-CM | POA: Diagnosis not present

## 2017-04-12 DIAGNOSIS — Z992 Dependence on renal dialysis: Secondary | ICD-10-CM | POA: Diagnosis not present

## 2017-04-12 DIAGNOSIS — N186 End stage renal disease: Secondary | ICD-10-CM | POA: Diagnosis not present

## 2017-04-12 DIAGNOSIS — N2581 Secondary hyperparathyroidism of renal origin: Secondary | ICD-10-CM | POA: Diagnosis not present

## 2017-04-12 DIAGNOSIS — D631 Anemia in chronic kidney disease: Secondary | ICD-10-CM | POA: Diagnosis not present

## 2017-04-13 DIAGNOSIS — E559 Vitamin D deficiency, unspecified: Secondary | ICD-10-CM | POA: Diagnosis not present

## 2017-04-13 DIAGNOSIS — N186 End stage renal disease: Secondary | ICD-10-CM | POA: Diagnosis not present

## 2017-04-13 DIAGNOSIS — D631 Anemia in chronic kidney disease: Secondary | ICD-10-CM | POA: Diagnosis not present

## 2017-04-13 DIAGNOSIS — N2581 Secondary hyperparathyroidism of renal origin: Secondary | ICD-10-CM | POA: Diagnosis not present

## 2017-04-13 DIAGNOSIS — Z992 Dependence on renal dialysis: Secondary | ICD-10-CM | POA: Diagnosis not present

## 2017-04-14 DIAGNOSIS — Z992 Dependence on renal dialysis: Secondary | ICD-10-CM | POA: Diagnosis not present

## 2017-04-14 DIAGNOSIS — D631 Anemia in chronic kidney disease: Secondary | ICD-10-CM | POA: Diagnosis not present

## 2017-04-14 DIAGNOSIS — N2581 Secondary hyperparathyroidism of renal origin: Secondary | ICD-10-CM | POA: Diagnosis not present

## 2017-04-14 DIAGNOSIS — N186 End stage renal disease: Secondary | ICD-10-CM | POA: Diagnosis not present

## 2017-04-14 DIAGNOSIS — E559 Vitamin D deficiency, unspecified: Secondary | ICD-10-CM | POA: Diagnosis not present

## 2017-04-15 DIAGNOSIS — E559 Vitamin D deficiency, unspecified: Secondary | ICD-10-CM | POA: Diagnosis not present

## 2017-04-15 DIAGNOSIS — Z992 Dependence on renal dialysis: Secondary | ICD-10-CM | POA: Diagnosis not present

## 2017-04-15 DIAGNOSIS — N2581 Secondary hyperparathyroidism of renal origin: Secondary | ICD-10-CM | POA: Diagnosis not present

## 2017-04-15 DIAGNOSIS — N186 End stage renal disease: Secondary | ICD-10-CM | POA: Diagnosis not present

## 2017-04-15 DIAGNOSIS — D631 Anemia in chronic kidney disease: Secondary | ICD-10-CM | POA: Diagnosis not present

## 2017-04-16 DIAGNOSIS — E559 Vitamin D deficiency, unspecified: Secondary | ICD-10-CM | POA: Diagnosis not present

## 2017-04-16 DIAGNOSIS — N2581 Secondary hyperparathyroidism of renal origin: Secondary | ICD-10-CM | POA: Diagnosis not present

## 2017-04-16 DIAGNOSIS — Z992 Dependence on renal dialysis: Secondary | ICD-10-CM | POA: Diagnosis not present

## 2017-04-16 DIAGNOSIS — D631 Anemia in chronic kidney disease: Secondary | ICD-10-CM | POA: Diagnosis not present

## 2017-04-16 DIAGNOSIS — N186 End stage renal disease: Secondary | ICD-10-CM | POA: Diagnosis not present

## 2017-04-17 DIAGNOSIS — E559 Vitamin D deficiency, unspecified: Secondary | ICD-10-CM | POA: Diagnosis not present

## 2017-04-17 DIAGNOSIS — N186 End stage renal disease: Secondary | ICD-10-CM | POA: Diagnosis not present

## 2017-04-17 DIAGNOSIS — D631 Anemia in chronic kidney disease: Secondary | ICD-10-CM | POA: Diagnosis not present

## 2017-04-17 DIAGNOSIS — N2581 Secondary hyperparathyroidism of renal origin: Secondary | ICD-10-CM | POA: Diagnosis not present

## 2017-04-17 DIAGNOSIS — Z992 Dependence on renal dialysis: Secondary | ICD-10-CM | POA: Diagnosis not present

## 2017-04-18 DIAGNOSIS — N2581 Secondary hyperparathyroidism of renal origin: Secondary | ICD-10-CM | POA: Diagnosis not present

## 2017-04-18 DIAGNOSIS — Z992 Dependence on renal dialysis: Secondary | ICD-10-CM | POA: Diagnosis not present

## 2017-04-18 DIAGNOSIS — D631 Anemia in chronic kidney disease: Secondary | ICD-10-CM | POA: Diagnosis not present

## 2017-04-18 DIAGNOSIS — E559 Vitamin D deficiency, unspecified: Secondary | ICD-10-CM | POA: Diagnosis not present

## 2017-04-18 DIAGNOSIS — N186 End stage renal disease: Secondary | ICD-10-CM | POA: Diagnosis not present

## 2017-04-19 DIAGNOSIS — D631 Anemia in chronic kidney disease: Secondary | ICD-10-CM | POA: Diagnosis not present

## 2017-04-19 DIAGNOSIS — N186 End stage renal disease: Secondary | ICD-10-CM | POA: Diagnosis not present

## 2017-04-19 DIAGNOSIS — N2581 Secondary hyperparathyroidism of renal origin: Secondary | ICD-10-CM | POA: Diagnosis not present

## 2017-04-19 DIAGNOSIS — E559 Vitamin D deficiency, unspecified: Secondary | ICD-10-CM | POA: Diagnosis not present

## 2017-04-19 DIAGNOSIS — Z992 Dependence on renal dialysis: Secondary | ICD-10-CM | POA: Diagnosis not present

## 2017-04-20 DIAGNOSIS — E559 Vitamin D deficiency, unspecified: Secondary | ICD-10-CM | POA: Diagnosis not present

## 2017-04-20 DIAGNOSIS — D631 Anemia in chronic kidney disease: Secondary | ICD-10-CM | POA: Diagnosis not present

## 2017-04-20 DIAGNOSIS — N2581 Secondary hyperparathyroidism of renal origin: Secondary | ICD-10-CM | POA: Diagnosis not present

## 2017-04-20 DIAGNOSIS — Z992 Dependence on renal dialysis: Secondary | ICD-10-CM | POA: Diagnosis not present

## 2017-04-20 DIAGNOSIS — N186 End stage renal disease: Secondary | ICD-10-CM | POA: Diagnosis not present

## 2017-04-21 DIAGNOSIS — E559 Vitamin D deficiency, unspecified: Secondary | ICD-10-CM | POA: Diagnosis not present

## 2017-04-21 DIAGNOSIS — N186 End stage renal disease: Secondary | ICD-10-CM | POA: Diagnosis not present

## 2017-04-21 DIAGNOSIS — Z992 Dependence on renal dialysis: Secondary | ICD-10-CM | POA: Diagnosis not present

## 2017-04-21 DIAGNOSIS — N2581 Secondary hyperparathyroidism of renal origin: Secondary | ICD-10-CM | POA: Diagnosis not present

## 2017-04-21 DIAGNOSIS — D631 Anemia in chronic kidney disease: Secondary | ICD-10-CM | POA: Diagnosis not present

## 2017-04-22 DIAGNOSIS — D631 Anemia in chronic kidney disease: Secondary | ICD-10-CM | POA: Diagnosis not present

## 2017-04-22 DIAGNOSIS — N186 End stage renal disease: Secondary | ICD-10-CM | POA: Diagnosis not present

## 2017-04-22 DIAGNOSIS — Z992 Dependence on renal dialysis: Secondary | ICD-10-CM | POA: Diagnosis not present

## 2017-04-22 DIAGNOSIS — E559 Vitamin D deficiency, unspecified: Secondary | ICD-10-CM | POA: Diagnosis not present

## 2017-04-22 DIAGNOSIS — N2581 Secondary hyperparathyroidism of renal origin: Secondary | ICD-10-CM | POA: Diagnosis not present

## 2017-04-23 DIAGNOSIS — N2581 Secondary hyperparathyroidism of renal origin: Secondary | ICD-10-CM | POA: Diagnosis not present

## 2017-04-23 DIAGNOSIS — N186 End stage renal disease: Secondary | ICD-10-CM | POA: Diagnosis not present

## 2017-04-23 DIAGNOSIS — Z992 Dependence on renal dialysis: Secondary | ICD-10-CM | POA: Diagnosis not present

## 2017-04-23 DIAGNOSIS — D631 Anemia in chronic kidney disease: Secondary | ICD-10-CM | POA: Diagnosis not present

## 2017-04-23 DIAGNOSIS — E559 Vitamin D deficiency, unspecified: Secondary | ICD-10-CM | POA: Diagnosis not present

## 2017-04-24 DIAGNOSIS — Z992 Dependence on renal dialysis: Secondary | ICD-10-CM | POA: Diagnosis not present

## 2017-04-24 DIAGNOSIS — N186 End stage renal disease: Secondary | ICD-10-CM | POA: Diagnosis not present

## 2017-04-24 DIAGNOSIS — N2581 Secondary hyperparathyroidism of renal origin: Secondary | ICD-10-CM | POA: Diagnosis not present

## 2017-04-24 DIAGNOSIS — D631 Anemia in chronic kidney disease: Secondary | ICD-10-CM | POA: Diagnosis not present

## 2017-04-24 DIAGNOSIS — E559 Vitamin D deficiency, unspecified: Secondary | ICD-10-CM | POA: Diagnosis not present

## 2017-04-25 DIAGNOSIS — Z992 Dependence on renal dialysis: Secondary | ICD-10-CM | POA: Diagnosis not present

## 2017-04-25 DIAGNOSIS — E559 Vitamin D deficiency, unspecified: Secondary | ICD-10-CM | POA: Diagnosis not present

## 2017-04-25 DIAGNOSIS — N186 End stage renal disease: Secondary | ICD-10-CM | POA: Diagnosis not present

## 2017-04-25 DIAGNOSIS — N2581 Secondary hyperparathyroidism of renal origin: Secondary | ICD-10-CM | POA: Diagnosis not present

## 2017-04-25 DIAGNOSIS — D631 Anemia in chronic kidney disease: Secondary | ICD-10-CM | POA: Diagnosis not present

## 2017-04-26 DIAGNOSIS — N186 End stage renal disease: Secondary | ICD-10-CM | POA: Diagnosis not present

## 2017-04-26 DIAGNOSIS — Z992 Dependence on renal dialysis: Secondary | ICD-10-CM | POA: Diagnosis not present

## 2017-04-26 DIAGNOSIS — N2581 Secondary hyperparathyroidism of renal origin: Secondary | ICD-10-CM | POA: Diagnosis not present

## 2017-04-26 DIAGNOSIS — D631 Anemia in chronic kidney disease: Secondary | ICD-10-CM | POA: Diagnosis not present

## 2017-04-26 DIAGNOSIS — E559 Vitamin D deficiency, unspecified: Secondary | ICD-10-CM | POA: Diagnosis not present

## 2017-04-27 DIAGNOSIS — N186 End stage renal disease: Secondary | ICD-10-CM | POA: Diagnosis not present

## 2017-04-27 DIAGNOSIS — Z992 Dependence on renal dialysis: Secondary | ICD-10-CM | POA: Diagnosis not present

## 2017-04-27 DIAGNOSIS — D631 Anemia in chronic kidney disease: Secondary | ICD-10-CM | POA: Diagnosis not present

## 2017-04-27 DIAGNOSIS — N2581 Secondary hyperparathyroidism of renal origin: Secondary | ICD-10-CM | POA: Diagnosis not present

## 2017-04-27 DIAGNOSIS — E559 Vitamin D deficiency, unspecified: Secondary | ICD-10-CM | POA: Diagnosis not present

## 2017-04-28 DIAGNOSIS — D631 Anemia in chronic kidney disease: Secondary | ICD-10-CM | POA: Diagnosis not present

## 2017-04-28 DIAGNOSIS — Z992 Dependence on renal dialysis: Secondary | ICD-10-CM | POA: Diagnosis not present

## 2017-04-28 DIAGNOSIS — N2581 Secondary hyperparathyroidism of renal origin: Secondary | ICD-10-CM | POA: Diagnosis not present

## 2017-04-28 DIAGNOSIS — N186 End stage renal disease: Secondary | ICD-10-CM | POA: Diagnosis not present

## 2017-04-29 DIAGNOSIS — D631 Anemia in chronic kidney disease: Secondary | ICD-10-CM | POA: Diagnosis not present

## 2017-04-29 DIAGNOSIS — N2581 Secondary hyperparathyroidism of renal origin: Secondary | ICD-10-CM | POA: Diagnosis not present

## 2017-04-29 DIAGNOSIS — Z992 Dependence on renal dialysis: Secondary | ICD-10-CM | POA: Diagnosis not present

## 2017-04-29 DIAGNOSIS — N186 End stage renal disease: Secondary | ICD-10-CM | POA: Diagnosis not present

## 2017-04-30 DIAGNOSIS — N186 End stage renal disease: Secondary | ICD-10-CM | POA: Diagnosis not present

## 2017-04-30 DIAGNOSIS — N2581 Secondary hyperparathyroidism of renal origin: Secondary | ICD-10-CM | POA: Diagnosis not present

## 2017-04-30 DIAGNOSIS — D631 Anemia in chronic kidney disease: Secondary | ICD-10-CM | POA: Diagnosis not present

## 2017-04-30 DIAGNOSIS — Z992 Dependence on renal dialysis: Secondary | ICD-10-CM | POA: Diagnosis not present

## 2017-05-01 DIAGNOSIS — N186 End stage renal disease: Secondary | ICD-10-CM | POA: Diagnosis not present

## 2017-05-01 DIAGNOSIS — N2581 Secondary hyperparathyroidism of renal origin: Secondary | ICD-10-CM | POA: Diagnosis not present

## 2017-05-01 DIAGNOSIS — D631 Anemia in chronic kidney disease: Secondary | ICD-10-CM | POA: Diagnosis not present

## 2017-05-01 DIAGNOSIS — Z992 Dependence on renal dialysis: Secondary | ICD-10-CM | POA: Diagnosis not present

## 2017-05-02 DIAGNOSIS — Z992 Dependence on renal dialysis: Secondary | ICD-10-CM | POA: Diagnosis not present

## 2017-05-02 DIAGNOSIS — N186 End stage renal disease: Secondary | ICD-10-CM | POA: Diagnosis not present

## 2017-05-02 DIAGNOSIS — D631 Anemia in chronic kidney disease: Secondary | ICD-10-CM | POA: Diagnosis not present

## 2017-05-02 DIAGNOSIS — N2581 Secondary hyperparathyroidism of renal origin: Secondary | ICD-10-CM | POA: Diagnosis not present

## 2017-05-03 DIAGNOSIS — N186 End stage renal disease: Secondary | ICD-10-CM | POA: Diagnosis not present

## 2017-05-03 DIAGNOSIS — Z992 Dependence on renal dialysis: Secondary | ICD-10-CM | POA: Diagnosis not present

## 2017-05-03 DIAGNOSIS — N2581 Secondary hyperparathyroidism of renal origin: Secondary | ICD-10-CM | POA: Diagnosis not present

## 2017-05-03 DIAGNOSIS — D631 Anemia in chronic kidney disease: Secondary | ICD-10-CM | POA: Diagnosis not present

## 2017-05-04 DIAGNOSIS — Z992 Dependence on renal dialysis: Secondary | ICD-10-CM | POA: Diagnosis not present

## 2017-05-04 DIAGNOSIS — N186 End stage renal disease: Secondary | ICD-10-CM | POA: Diagnosis not present

## 2017-05-04 DIAGNOSIS — N2581 Secondary hyperparathyroidism of renal origin: Secondary | ICD-10-CM | POA: Diagnosis not present

## 2017-05-04 DIAGNOSIS — D631 Anemia in chronic kidney disease: Secondary | ICD-10-CM | POA: Diagnosis not present

## 2017-05-05 DIAGNOSIS — D631 Anemia in chronic kidney disease: Secondary | ICD-10-CM | POA: Diagnosis not present

## 2017-05-05 DIAGNOSIS — N186 End stage renal disease: Secondary | ICD-10-CM | POA: Diagnosis not present

## 2017-05-05 DIAGNOSIS — Z992 Dependence on renal dialysis: Secondary | ICD-10-CM | POA: Diagnosis not present

## 2017-05-05 DIAGNOSIS — N2581 Secondary hyperparathyroidism of renal origin: Secondary | ICD-10-CM | POA: Diagnosis not present

## 2017-05-06 DIAGNOSIS — N2581 Secondary hyperparathyroidism of renal origin: Secondary | ICD-10-CM | POA: Diagnosis not present

## 2017-05-06 DIAGNOSIS — N186 End stage renal disease: Secondary | ICD-10-CM | POA: Diagnosis not present

## 2017-05-06 DIAGNOSIS — D631 Anemia in chronic kidney disease: Secondary | ICD-10-CM | POA: Diagnosis not present

## 2017-05-06 DIAGNOSIS — Z992 Dependence on renal dialysis: Secondary | ICD-10-CM | POA: Diagnosis not present

## 2017-05-07 DIAGNOSIS — Z992 Dependence on renal dialysis: Secondary | ICD-10-CM | POA: Diagnosis not present

## 2017-05-07 DIAGNOSIS — N2581 Secondary hyperparathyroidism of renal origin: Secondary | ICD-10-CM | POA: Diagnosis not present

## 2017-05-07 DIAGNOSIS — N186 End stage renal disease: Secondary | ICD-10-CM | POA: Diagnosis not present

## 2017-05-07 DIAGNOSIS — D631 Anemia in chronic kidney disease: Secondary | ICD-10-CM | POA: Diagnosis not present

## 2017-05-08 DIAGNOSIS — N186 End stage renal disease: Secondary | ICD-10-CM | POA: Diagnosis not present

## 2017-05-08 DIAGNOSIS — Z992 Dependence on renal dialysis: Secondary | ICD-10-CM | POA: Diagnosis not present

## 2017-05-08 DIAGNOSIS — N2581 Secondary hyperparathyroidism of renal origin: Secondary | ICD-10-CM | POA: Diagnosis not present

## 2017-05-08 DIAGNOSIS — D631 Anemia in chronic kidney disease: Secondary | ICD-10-CM | POA: Diagnosis not present

## 2017-05-09 DIAGNOSIS — N2581 Secondary hyperparathyroidism of renal origin: Secondary | ICD-10-CM | POA: Diagnosis not present

## 2017-05-09 DIAGNOSIS — Z992 Dependence on renal dialysis: Secondary | ICD-10-CM | POA: Diagnosis not present

## 2017-05-09 DIAGNOSIS — D631 Anemia in chronic kidney disease: Secondary | ICD-10-CM | POA: Diagnosis not present

## 2017-05-09 DIAGNOSIS — N186 End stage renal disease: Secondary | ICD-10-CM | POA: Diagnosis not present

## 2017-05-10 DIAGNOSIS — Z992 Dependence on renal dialysis: Secondary | ICD-10-CM | POA: Diagnosis not present

## 2017-05-10 DIAGNOSIS — D631 Anemia in chronic kidney disease: Secondary | ICD-10-CM | POA: Diagnosis not present

## 2017-05-10 DIAGNOSIS — N2581 Secondary hyperparathyroidism of renal origin: Secondary | ICD-10-CM | POA: Diagnosis not present

## 2017-05-10 DIAGNOSIS — N186 End stage renal disease: Secondary | ICD-10-CM | POA: Diagnosis not present

## 2017-05-11 DIAGNOSIS — D631 Anemia in chronic kidney disease: Secondary | ICD-10-CM | POA: Diagnosis not present

## 2017-05-11 DIAGNOSIS — N2581 Secondary hyperparathyroidism of renal origin: Secondary | ICD-10-CM | POA: Diagnosis not present

## 2017-05-11 DIAGNOSIS — Z992 Dependence on renal dialysis: Secondary | ICD-10-CM | POA: Diagnosis not present

## 2017-05-11 DIAGNOSIS — N186 End stage renal disease: Secondary | ICD-10-CM | POA: Diagnosis not present

## 2017-05-12 DIAGNOSIS — N186 End stage renal disease: Secondary | ICD-10-CM | POA: Diagnosis not present

## 2017-05-12 DIAGNOSIS — Z992 Dependence on renal dialysis: Secondary | ICD-10-CM | POA: Diagnosis not present

## 2017-05-12 DIAGNOSIS — D631 Anemia in chronic kidney disease: Secondary | ICD-10-CM | POA: Diagnosis not present

## 2017-05-12 DIAGNOSIS — N2581 Secondary hyperparathyroidism of renal origin: Secondary | ICD-10-CM | POA: Diagnosis not present

## 2017-05-13 DIAGNOSIS — N186 End stage renal disease: Secondary | ICD-10-CM | POA: Diagnosis not present

## 2017-05-13 DIAGNOSIS — N2581 Secondary hyperparathyroidism of renal origin: Secondary | ICD-10-CM | POA: Diagnosis not present

## 2017-05-13 DIAGNOSIS — D631 Anemia in chronic kidney disease: Secondary | ICD-10-CM | POA: Diagnosis not present

## 2017-05-13 DIAGNOSIS — Z992 Dependence on renal dialysis: Secondary | ICD-10-CM | POA: Diagnosis not present

## 2017-05-14 DIAGNOSIS — D631 Anemia in chronic kidney disease: Secondary | ICD-10-CM | POA: Diagnosis not present

## 2017-05-14 DIAGNOSIS — Z992 Dependence on renal dialysis: Secondary | ICD-10-CM | POA: Diagnosis not present

## 2017-05-14 DIAGNOSIS — N2581 Secondary hyperparathyroidism of renal origin: Secondary | ICD-10-CM | POA: Diagnosis not present

## 2017-05-14 DIAGNOSIS — N186 End stage renal disease: Secondary | ICD-10-CM | POA: Diagnosis not present

## 2017-05-15 DIAGNOSIS — Z992 Dependence on renal dialysis: Secondary | ICD-10-CM | POA: Diagnosis not present

## 2017-05-15 DIAGNOSIS — N186 End stage renal disease: Secondary | ICD-10-CM | POA: Diagnosis not present

## 2017-05-15 DIAGNOSIS — N2581 Secondary hyperparathyroidism of renal origin: Secondary | ICD-10-CM | POA: Diagnosis not present

## 2017-05-15 DIAGNOSIS — D631 Anemia in chronic kidney disease: Secondary | ICD-10-CM | POA: Diagnosis not present

## 2017-05-16 DIAGNOSIS — D631 Anemia in chronic kidney disease: Secondary | ICD-10-CM | POA: Diagnosis not present

## 2017-05-16 DIAGNOSIS — Z992 Dependence on renal dialysis: Secondary | ICD-10-CM | POA: Diagnosis not present

## 2017-05-16 DIAGNOSIS — N186 End stage renal disease: Secondary | ICD-10-CM | POA: Diagnosis not present

## 2017-05-16 DIAGNOSIS — N2581 Secondary hyperparathyroidism of renal origin: Secondary | ICD-10-CM | POA: Diagnosis not present

## 2017-05-17 DIAGNOSIS — N186 End stage renal disease: Secondary | ICD-10-CM | POA: Diagnosis not present

## 2017-05-17 DIAGNOSIS — N2581 Secondary hyperparathyroidism of renal origin: Secondary | ICD-10-CM | POA: Diagnosis not present

## 2017-05-17 DIAGNOSIS — Z992 Dependence on renal dialysis: Secondary | ICD-10-CM | POA: Diagnosis not present

## 2017-05-17 DIAGNOSIS — D631 Anemia in chronic kidney disease: Secondary | ICD-10-CM | POA: Diagnosis not present

## 2017-05-18 DIAGNOSIS — N186 End stage renal disease: Secondary | ICD-10-CM | POA: Diagnosis not present

## 2017-05-18 DIAGNOSIS — D631 Anemia in chronic kidney disease: Secondary | ICD-10-CM | POA: Diagnosis not present

## 2017-05-18 DIAGNOSIS — Z992 Dependence on renal dialysis: Secondary | ICD-10-CM | POA: Diagnosis not present

## 2017-05-18 DIAGNOSIS — N2581 Secondary hyperparathyroidism of renal origin: Secondary | ICD-10-CM | POA: Diagnosis not present

## 2017-05-19 DIAGNOSIS — N2581 Secondary hyperparathyroidism of renal origin: Secondary | ICD-10-CM | POA: Diagnosis not present

## 2017-05-19 DIAGNOSIS — N186 End stage renal disease: Secondary | ICD-10-CM | POA: Diagnosis not present

## 2017-05-19 DIAGNOSIS — Z992 Dependence on renal dialysis: Secondary | ICD-10-CM | POA: Diagnosis not present

## 2017-05-19 DIAGNOSIS — D631 Anemia in chronic kidney disease: Secondary | ICD-10-CM | POA: Diagnosis not present

## 2017-05-20 DIAGNOSIS — N186 End stage renal disease: Secondary | ICD-10-CM | POA: Diagnosis not present

## 2017-05-20 DIAGNOSIS — D631 Anemia in chronic kidney disease: Secondary | ICD-10-CM | POA: Diagnosis not present

## 2017-05-20 DIAGNOSIS — N2581 Secondary hyperparathyroidism of renal origin: Secondary | ICD-10-CM | POA: Diagnosis not present

## 2017-05-20 DIAGNOSIS — Z992 Dependence on renal dialysis: Secondary | ICD-10-CM | POA: Diagnosis not present

## 2017-05-21 DIAGNOSIS — D631 Anemia in chronic kidney disease: Secondary | ICD-10-CM | POA: Diagnosis not present

## 2017-05-21 DIAGNOSIS — Z992 Dependence on renal dialysis: Secondary | ICD-10-CM | POA: Diagnosis not present

## 2017-05-21 DIAGNOSIS — N186 End stage renal disease: Secondary | ICD-10-CM | POA: Diagnosis not present

## 2017-05-21 DIAGNOSIS — N2581 Secondary hyperparathyroidism of renal origin: Secondary | ICD-10-CM | POA: Diagnosis not present

## 2017-05-22 DIAGNOSIS — D631 Anemia in chronic kidney disease: Secondary | ICD-10-CM | POA: Diagnosis not present

## 2017-05-22 DIAGNOSIS — Z992 Dependence on renal dialysis: Secondary | ICD-10-CM | POA: Diagnosis not present

## 2017-05-22 DIAGNOSIS — N2581 Secondary hyperparathyroidism of renal origin: Secondary | ICD-10-CM | POA: Diagnosis not present

## 2017-05-22 DIAGNOSIS — N186 End stage renal disease: Secondary | ICD-10-CM | POA: Diagnosis not present

## 2017-05-23 DIAGNOSIS — N2581 Secondary hyperparathyroidism of renal origin: Secondary | ICD-10-CM | POA: Diagnosis not present

## 2017-05-23 DIAGNOSIS — Z992 Dependence on renal dialysis: Secondary | ICD-10-CM | POA: Diagnosis not present

## 2017-05-23 DIAGNOSIS — N186 End stage renal disease: Secondary | ICD-10-CM | POA: Diagnosis not present

## 2017-05-23 DIAGNOSIS — D631 Anemia in chronic kidney disease: Secondary | ICD-10-CM | POA: Diagnosis not present

## 2017-05-24 DIAGNOSIS — Z992 Dependence on renal dialysis: Secondary | ICD-10-CM | POA: Diagnosis not present

## 2017-05-24 DIAGNOSIS — D631 Anemia in chronic kidney disease: Secondary | ICD-10-CM | POA: Diagnosis not present

## 2017-05-24 DIAGNOSIS — N2581 Secondary hyperparathyroidism of renal origin: Secondary | ICD-10-CM | POA: Diagnosis not present

## 2017-05-24 DIAGNOSIS — N186 End stage renal disease: Secondary | ICD-10-CM | POA: Diagnosis not present

## 2017-05-25 DIAGNOSIS — D631 Anemia in chronic kidney disease: Secondary | ICD-10-CM | POA: Diagnosis not present

## 2017-05-25 DIAGNOSIS — Z992 Dependence on renal dialysis: Secondary | ICD-10-CM | POA: Diagnosis not present

## 2017-05-25 DIAGNOSIS — N186 End stage renal disease: Secondary | ICD-10-CM | POA: Diagnosis not present

## 2017-05-25 DIAGNOSIS — N2581 Secondary hyperparathyroidism of renal origin: Secondary | ICD-10-CM | POA: Diagnosis not present

## 2017-05-26 DIAGNOSIS — N2581 Secondary hyperparathyroidism of renal origin: Secondary | ICD-10-CM | POA: Diagnosis not present

## 2017-05-26 DIAGNOSIS — Z992 Dependence on renal dialysis: Secondary | ICD-10-CM | POA: Diagnosis not present

## 2017-05-26 DIAGNOSIS — N186 End stage renal disease: Secondary | ICD-10-CM | POA: Diagnosis not present

## 2017-05-26 DIAGNOSIS — D509 Iron deficiency anemia, unspecified: Secondary | ICD-10-CM | POA: Diagnosis not present

## 2017-05-26 DIAGNOSIS — D631 Anemia in chronic kidney disease: Secondary | ICD-10-CM | POA: Diagnosis not present

## 2017-05-27 DIAGNOSIS — N2581 Secondary hyperparathyroidism of renal origin: Secondary | ICD-10-CM | POA: Diagnosis not present

## 2017-05-27 DIAGNOSIS — N186 End stage renal disease: Secondary | ICD-10-CM | POA: Diagnosis not present

## 2017-05-27 DIAGNOSIS — Z992 Dependence on renal dialysis: Secondary | ICD-10-CM | POA: Diagnosis not present

## 2017-05-27 DIAGNOSIS — D509 Iron deficiency anemia, unspecified: Secondary | ICD-10-CM | POA: Diagnosis not present

## 2017-05-27 DIAGNOSIS — D631 Anemia in chronic kidney disease: Secondary | ICD-10-CM | POA: Diagnosis not present

## 2017-05-28 DIAGNOSIS — N186 End stage renal disease: Secondary | ICD-10-CM | POA: Diagnosis not present

## 2017-05-28 DIAGNOSIS — D509 Iron deficiency anemia, unspecified: Secondary | ICD-10-CM | POA: Diagnosis not present

## 2017-05-28 DIAGNOSIS — Z992 Dependence on renal dialysis: Secondary | ICD-10-CM | POA: Diagnosis not present

## 2017-05-28 DIAGNOSIS — N2581 Secondary hyperparathyroidism of renal origin: Secondary | ICD-10-CM | POA: Diagnosis not present

## 2017-05-28 DIAGNOSIS — D631 Anemia in chronic kidney disease: Secondary | ICD-10-CM | POA: Diagnosis not present

## 2017-05-29 DIAGNOSIS — Z992 Dependence on renal dialysis: Secondary | ICD-10-CM | POA: Diagnosis not present

## 2017-05-29 DIAGNOSIS — N2581 Secondary hyperparathyroidism of renal origin: Secondary | ICD-10-CM | POA: Diagnosis not present

## 2017-05-29 DIAGNOSIS — N186 End stage renal disease: Secondary | ICD-10-CM | POA: Diagnosis not present

## 2017-05-29 DIAGNOSIS — D509 Iron deficiency anemia, unspecified: Secondary | ICD-10-CM | POA: Diagnosis not present

## 2017-05-29 DIAGNOSIS — D631 Anemia in chronic kidney disease: Secondary | ICD-10-CM | POA: Diagnosis not present

## 2017-05-30 DIAGNOSIS — Z992 Dependence on renal dialysis: Secondary | ICD-10-CM | POA: Diagnosis not present

## 2017-05-30 DIAGNOSIS — D631 Anemia in chronic kidney disease: Secondary | ICD-10-CM | POA: Diagnosis not present

## 2017-05-30 DIAGNOSIS — N186 End stage renal disease: Secondary | ICD-10-CM | POA: Diagnosis not present

## 2017-05-30 DIAGNOSIS — N2581 Secondary hyperparathyroidism of renal origin: Secondary | ICD-10-CM | POA: Diagnosis not present

## 2017-05-30 DIAGNOSIS — D509 Iron deficiency anemia, unspecified: Secondary | ICD-10-CM | POA: Diagnosis not present

## 2017-05-31 DIAGNOSIS — N186 End stage renal disease: Secondary | ICD-10-CM | POA: Diagnosis not present

## 2017-05-31 DIAGNOSIS — D509 Iron deficiency anemia, unspecified: Secondary | ICD-10-CM | POA: Diagnosis not present

## 2017-05-31 DIAGNOSIS — N2581 Secondary hyperparathyroidism of renal origin: Secondary | ICD-10-CM | POA: Diagnosis not present

## 2017-05-31 DIAGNOSIS — Z992 Dependence on renal dialysis: Secondary | ICD-10-CM | POA: Diagnosis not present

## 2017-05-31 DIAGNOSIS — D631 Anemia in chronic kidney disease: Secondary | ICD-10-CM | POA: Diagnosis not present

## 2017-06-01 DIAGNOSIS — N186 End stage renal disease: Secondary | ICD-10-CM | POA: Diagnosis not present

## 2017-06-01 DIAGNOSIS — D631 Anemia in chronic kidney disease: Secondary | ICD-10-CM | POA: Diagnosis not present

## 2017-06-01 DIAGNOSIS — N2581 Secondary hyperparathyroidism of renal origin: Secondary | ICD-10-CM | POA: Diagnosis not present

## 2017-06-01 DIAGNOSIS — Z992 Dependence on renal dialysis: Secondary | ICD-10-CM | POA: Diagnosis not present

## 2017-06-01 DIAGNOSIS — D509 Iron deficiency anemia, unspecified: Secondary | ICD-10-CM | POA: Diagnosis not present

## 2017-06-02 DIAGNOSIS — N2581 Secondary hyperparathyroidism of renal origin: Secondary | ICD-10-CM | POA: Diagnosis not present

## 2017-06-02 DIAGNOSIS — D509 Iron deficiency anemia, unspecified: Secondary | ICD-10-CM | POA: Diagnosis not present

## 2017-06-02 DIAGNOSIS — Z992 Dependence on renal dialysis: Secondary | ICD-10-CM | POA: Diagnosis not present

## 2017-06-02 DIAGNOSIS — D631 Anemia in chronic kidney disease: Secondary | ICD-10-CM | POA: Diagnosis not present

## 2017-06-02 DIAGNOSIS — N186 End stage renal disease: Secondary | ICD-10-CM | POA: Diagnosis not present

## 2017-06-03 DIAGNOSIS — N186 End stage renal disease: Secondary | ICD-10-CM | POA: Diagnosis not present

## 2017-06-03 DIAGNOSIS — D631 Anemia in chronic kidney disease: Secondary | ICD-10-CM | POA: Diagnosis not present

## 2017-06-03 DIAGNOSIS — N2581 Secondary hyperparathyroidism of renal origin: Secondary | ICD-10-CM | POA: Diagnosis not present

## 2017-06-03 DIAGNOSIS — D509 Iron deficiency anemia, unspecified: Secondary | ICD-10-CM | POA: Diagnosis not present

## 2017-06-03 DIAGNOSIS — Z992 Dependence on renal dialysis: Secondary | ICD-10-CM | POA: Diagnosis not present

## 2017-06-04 DIAGNOSIS — D631 Anemia in chronic kidney disease: Secondary | ICD-10-CM | POA: Diagnosis not present

## 2017-06-04 DIAGNOSIS — Z992 Dependence on renal dialysis: Secondary | ICD-10-CM | POA: Diagnosis not present

## 2017-06-04 DIAGNOSIS — N186 End stage renal disease: Secondary | ICD-10-CM | POA: Diagnosis not present

## 2017-06-04 DIAGNOSIS — D509 Iron deficiency anemia, unspecified: Secondary | ICD-10-CM | POA: Diagnosis not present

## 2017-06-04 DIAGNOSIS — N2581 Secondary hyperparathyroidism of renal origin: Secondary | ICD-10-CM | POA: Diagnosis not present

## 2017-06-05 DIAGNOSIS — Z992 Dependence on renal dialysis: Secondary | ICD-10-CM | POA: Diagnosis not present

## 2017-06-05 DIAGNOSIS — D509 Iron deficiency anemia, unspecified: Secondary | ICD-10-CM | POA: Diagnosis not present

## 2017-06-05 DIAGNOSIS — D631 Anemia in chronic kidney disease: Secondary | ICD-10-CM | POA: Diagnosis not present

## 2017-06-05 DIAGNOSIS — N186 End stage renal disease: Secondary | ICD-10-CM | POA: Diagnosis not present

## 2017-06-05 DIAGNOSIS — N2581 Secondary hyperparathyroidism of renal origin: Secondary | ICD-10-CM | POA: Diagnosis not present

## 2017-06-06 DIAGNOSIS — N186 End stage renal disease: Secondary | ICD-10-CM | POA: Diagnosis not present

## 2017-06-06 DIAGNOSIS — N2581 Secondary hyperparathyroidism of renal origin: Secondary | ICD-10-CM | POA: Diagnosis not present

## 2017-06-06 DIAGNOSIS — Z992 Dependence on renal dialysis: Secondary | ICD-10-CM | POA: Diagnosis not present

## 2017-06-06 DIAGNOSIS — D509 Iron deficiency anemia, unspecified: Secondary | ICD-10-CM | POA: Diagnosis not present

## 2017-06-06 DIAGNOSIS — D631 Anemia in chronic kidney disease: Secondary | ICD-10-CM | POA: Diagnosis not present

## 2017-06-07 DIAGNOSIS — B351 Tinea unguium: Secondary | ICD-10-CM | POA: Diagnosis not present

## 2017-06-07 DIAGNOSIS — L603 Nail dystrophy: Secondary | ICD-10-CM | POA: Diagnosis not present

## 2017-06-07 DIAGNOSIS — I739 Peripheral vascular disease, unspecified: Secondary | ICD-10-CM | POA: Diagnosis not present

## 2017-06-07 DIAGNOSIS — D631 Anemia in chronic kidney disease: Secondary | ICD-10-CM | POA: Diagnosis not present

## 2017-06-07 DIAGNOSIS — D509 Iron deficiency anemia, unspecified: Secondary | ICD-10-CM | POA: Diagnosis not present

## 2017-06-07 DIAGNOSIS — E1142 Type 2 diabetes mellitus with diabetic polyneuropathy: Secondary | ICD-10-CM | POA: Diagnosis not present

## 2017-06-07 DIAGNOSIS — M2011 Hallux valgus (acquired), right foot: Secondary | ICD-10-CM | POA: Diagnosis not present

## 2017-06-07 DIAGNOSIS — Z89422 Acquired absence of other left toe(s): Secondary | ICD-10-CM | POA: Diagnosis not present

## 2017-06-07 DIAGNOSIS — N186 End stage renal disease: Secondary | ICD-10-CM | POA: Diagnosis not present

## 2017-06-07 DIAGNOSIS — Z992 Dependence on renal dialysis: Secondary | ICD-10-CM | POA: Diagnosis not present

## 2017-06-07 DIAGNOSIS — N2581 Secondary hyperparathyroidism of renal origin: Secondary | ICD-10-CM | POA: Diagnosis not present

## 2017-06-08 DIAGNOSIS — D509 Iron deficiency anemia, unspecified: Secondary | ICD-10-CM | POA: Diagnosis not present

## 2017-06-08 DIAGNOSIS — N2581 Secondary hyperparathyroidism of renal origin: Secondary | ICD-10-CM | POA: Diagnosis not present

## 2017-06-08 DIAGNOSIS — N186 End stage renal disease: Secondary | ICD-10-CM | POA: Diagnosis not present

## 2017-06-08 DIAGNOSIS — D631 Anemia in chronic kidney disease: Secondary | ICD-10-CM | POA: Diagnosis not present

## 2017-06-08 DIAGNOSIS — Z992 Dependence on renal dialysis: Secondary | ICD-10-CM | POA: Diagnosis not present

## 2017-06-09 DIAGNOSIS — D509 Iron deficiency anemia, unspecified: Secondary | ICD-10-CM | POA: Diagnosis not present

## 2017-06-09 DIAGNOSIS — N2581 Secondary hyperparathyroidism of renal origin: Secondary | ICD-10-CM | POA: Diagnosis not present

## 2017-06-09 DIAGNOSIS — N186 End stage renal disease: Secondary | ICD-10-CM | POA: Diagnosis not present

## 2017-06-09 DIAGNOSIS — Z992 Dependence on renal dialysis: Secondary | ICD-10-CM | POA: Diagnosis not present

## 2017-06-09 DIAGNOSIS — D631 Anemia in chronic kidney disease: Secondary | ICD-10-CM | POA: Diagnosis not present

## 2017-06-10 DIAGNOSIS — D509 Iron deficiency anemia, unspecified: Secondary | ICD-10-CM | POA: Diagnosis not present

## 2017-06-10 DIAGNOSIS — D631 Anemia in chronic kidney disease: Secondary | ICD-10-CM | POA: Diagnosis not present

## 2017-06-10 DIAGNOSIS — N186 End stage renal disease: Secondary | ICD-10-CM | POA: Diagnosis not present

## 2017-06-10 DIAGNOSIS — N2581 Secondary hyperparathyroidism of renal origin: Secondary | ICD-10-CM | POA: Diagnosis not present

## 2017-06-10 DIAGNOSIS — Z992 Dependence on renal dialysis: Secondary | ICD-10-CM | POA: Diagnosis not present

## 2017-06-11 DIAGNOSIS — D631 Anemia in chronic kidney disease: Secondary | ICD-10-CM | POA: Diagnosis not present

## 2017-06-11 DIAGNOSIS — D509 Iron deficiency anemia, unspecified: Secondary | ICD-10-CM | POA: Diagnosis not present

## 2017-06-11 DIAGNOSIS — N2581 Secondary hyperparathyroidism of renal origin: Secondary | ICD-10-CM | POA: Diagnosis not present

## 2017-06-11 DIAGNOSIS — Z992 Dependence on renal dialysis: Secondary | ICD-10-CM | POA: Diagnosis not present

## 2017-06-11 DIAGNOSIS — N186 End stage renal disease: Secondary | ICD-10-CM | POA: Diagnosis not present

## 2017-06-12 DIAGNOSIS — D631 Anemia in chronic kidney disease: Secondary | ICD-10-CM | POA: Diagnosis not present

## 2017-06-12 DIAGNOSIS — N186 End stage renal disease: Secondary | ICD-10-CM | POA: Diagnosis not present

## 2017-06-12 DIAGNOSIS — N2581 Secondary hyperparathyroidism of renal origin: Secondary | ICD-10-CM | POA: Diagnosis not present

## 2017-06-12 DIAGNOSIS — D509 Iron deficiency anemia, unspecified: Secondary | ICD-10-CM | POA: Diagnosis not present

## 2017-06-12 DIAGNOSIS — Z992 Dependence on renal dialysis: Secondary | ICD-10-CM | POA: Diagnosis not present

## 2017-06-13 DIAGNOSIS — D509 Iron deficiency anemia, unspecified: Secondary | ICD-10-CM | POA: Diagnosis not present

## 2017-06-13 DIAGNOSIS — N2581 Secondary hyperparathyroidism of renal origin: Secondary | ICD-10-CM | POA: Diagnosis not present

## 2017-06-13 DIAGNOSIS — D631 Anemia in chronic kidney disease: Secondary | ICD-10-CM | POA: Diagnosis not present

## 2017-06-13 DIAGNOSIS — Z992 Dependence on renal dialysis: Secondary | ICD-10-CM | POA: Diagnosis not present

## 2017-06-13 DIAGNOSIS — N186 End stage renal disease: Secondary | ICD-10-CM | POA: Diagnosis not present

## 2017-06-14 DIAGNOSIS — Z992 Dependence on renal dialysis: Secondary | ICD-10-CM | POA: Diagnosis not present

## 2017-06-14 DIAGNOSIS — D631 Anemia in chronic kidney disease: Secondary | ICD-10-CM | POA: Diagnosis not present

## 2017-06-14 DIAGNOSIS — N2581 Secondary hyperparathyroidism of renal origin: Secondary | ICD-10-CM | POA: Diagnosis not present

## 2017-06-14 DIAGNOSIS — N186 End stage renal disease: Secondary | ICD-10-CM | POA: Diagnosis not present

## 2017-06-14 DIAGNOSIS — D509 Iron deficiency anemia, unspecified: Secondary | ICD-10-CM | POA: Diagnosis not present

## 2017-06-15 DIAGNOSIS — D509 Iron deficiency anemia, unspecified: Secondary | ICD-10-CM | POA: Diagnosis not present

## 2017-06-15 DIAGNOSIS — N186 End stage renal disease: Secondary | ICD-10-CM | POA: Diagnosis not present

## 2017-06-15 DIAGNOSIS — N2581 Secondary hyperparathyroidism of renal origin: Secondary | ICD-10-CM | POA: Diagnosis not present

## 2017-06-15 DIAGNOSIS — Z992 Dependence on renal dialysis: Secondary | ICD-10-CM | POA: Diagnosis not present

## 2017-06-15 DIAGNOSIS — D631 Anemia in chronic kidney disease: Secondary | ICD-10-CM | POA: Diagnosis not present

## 2017-06-16 DIAGNOSIS — N2581 Secondary hyperparathyroidism of renal origin: Secondary | ICD-10-CM | POA: Diagnosis not present

## 2017-06-16 DIAGNOSIS — Z992 Dependence on renal dialysis: Secondary | ICD-10-CM | POA: Diagnosis not present

## 2017-06-16 DIAGNOSIS — D509 Iron deficiency anemia, unspecified: Secondary | ICD-10-CM | POA: Diagnosis not present

## 2017-06-16 DIAGNOSIS — D631 Anemia in chronic kidney disease: Secondary | ICD-10-CM | POA: Diagnosis not present

## 2017-06-16 DIAGNOSIS — N186 End stage renal disease: Secondary | ICD-10-CM | POA: Diagnosis not present

## 2017-06-17 DIAGNOSIS — D509 Iron deficiency anemia, unspecified: Secondary | ICD-10-CM | POA: Diagnosis not present

## 2017-06-17 DIAGNOSIS — N186 End stage renal disease: Secondary | ICD-10-CM | POA: Diagnosis not present

## 2017-06-17 DIAGNOSIS — Z992 Dependence on renal dialysis: Secondary | ICD-10-CM | POA: Diagnosis not present

## 2017-06-17 DIAGNOSIS — N2581 Secondary hyperparathyroidism of renal origin: Secondary | ICD-10-CM | POA: Diagnosis not present

## 2017-06-17 DIAGNOSIS — D631 Anemia in chronic kidney disease: Secondary | ICD-10-CM | POA: Diagnosis not present

## 2017-06-18 DIAGNOSIS — N2581 Secondary hyperparathyroidism of renal origin: Secondary | ICD-10-CM | POA: Diagnosis not present

## 2017-06-18 DIAGNOSIS — D509 Iron deficiency anemia, unspecified: Secondary | ICD-10-CM | POA: Diagnosis not present

## 2017-06-18 DIAGNOSIS — D631 Anemia in chronic kidney disease: Secondary | ICD-10-CM | POA: Diagnosis not present

## 2017-06-18 DIAGNOSIS — Z992 Dependence on renal dialysis: Secondary | ICD-10-CM | POA: Diagnosis not present

## 2017-06-18 DIAGNOSIS — N186 End stage renal disease: Secondary | ICD-10-CM | POA: Diagnosis not present

## 2017-06-19 DIAGNOSIS — N920 Excessive and frequent menstruation with regular cycle: Secondary | ICD-10-CM | POA: Diagnosis not present

## 2017-06-19 DIAGNOSIS — N186 End stage renal disease: Secondary | ICD-10-CM | POA: Diagnosis not present

## 2017-06-19 DIAGNOSIS — E11311 Type 2 diabetes mellitus with unspecified diabetic retinopathy with macular edema: Secondary | ICD-10-CM | POA: Diagnosis not present

## 2017-06-19 DIAGNOSIS — Z992 Dependence on renal dialysis: Secondary | ICD-10-CM | POA: Diagnosis not present

## 2017-06-19 DIAGNOSIS — D509 Iron deficiency anemia, unspecified: Secondary | ICD-10-CM | POA: Diagnosis not present

## 2017-06-19 DIAGNOSIS — N2581 Secondary hyperparathyroidism of renal origin: Secondary | ICD-10-CM | POA: Diagnosis not present

## 2017-06-19 DIAGNOSIS — D631 Anemia in chronic kidney disease: Secondary | ICD-10-CM | POA: Diagnosis not present

## 2017-06-19 DIAGNOSIS — I10 Essential (primary) hypertension: Secondary | ICD-10-CM | POA: Diagnosis not present

## 2017-06-20 DIAGNOSIS — D509 Iron deficiency anemia, unspecified: Secondary | ICD-10-CM | POA: Diagnosis not present

## 2017-06-20 DIAGNOSIS — N2581 Secondary hyperparathyroidism of renal origin: Secondary | ICD-10-CM | POA: Diagnosis not present

## 2017-06-20 DIAGNOSIS — N186 End stage renal disease: Secondary | ICD-10-CM | POA: Diagnosis not present

## 2017-06-20 DIAGNOSIS — D631 Anemia in chronic kidney disease: Secondary | ICD-10-CM | POA: Diagnosis not present

## 2017-06-20 DIAGNOSIS — Z992 Dependence on renal dialysis: Secondary | ICD-10-CM | POA: Diagnosis not present

## 2017-06-21 DIAGNOSIS — N2581 Secondary hyperparathyroidism of renal origin: Secondary | ICD-10-CM | POA: Diagnosis not present

## 2017-06-21 DIAGNOSIS — D631 Anemia in chronic kidney disease: Secondary | ICD-10-CM | POA: Diagnosis not present

## 2017-06-21 DIAGNOSIS — D509 Iron deficiency anemia, unspecified: Secondary | ICD-10-CM | POA: Diagnosis not present

## 2017-06-21 DIAGNOSIS — Z992 Dependence on renal dialysis: Secondary | ICD-10-CM | POA: Diagnosis not present

## 2017-06-21 DIAGNOSIS — N186 End stage renal disease: Secondary | ICD-10-CM | POA: Diagnosis not present

## 2017-06-22 DIAGNOSIS — N2581 Secondary hyperparathyroidism of renal origin: Secondary | ICD-10-CM | POA: Diagnosis not present

## 2017-06-22 DIAGNOSIS — D631 Anemia in chronic kidney disease: Secondary | ICD-10-CM | POA: Diagnosis not present

## 2017-06-22 DIAGNOSIS — D509 Iron deficiency anemia, unspecified: Secondary | ICD-10-CM | POA: Diagnosis not present

## 2017-06-22 DIAGNOSIS — Z992 Dependence on renal dialysis: Secondary | ICD-10-CM | POA: Diagnosis not present

## 2017-06-22 DIAGNOSIS — N186 End stage renal disease: Secondary | ICD-10-CM | POA: Diagnosis not present

## 2017-06-23 DIAGNOSIS — D509 Iron deficiency anemia, unspecified: Secondary | ICD-10-CM | POA: Diagnosis not present

## 2017-06-23 DIAGNOSIS — N186 End stage renal disease: Secondary | ICD-10-CM | POA: Diagnosis not present

## 2017-06-23 DIAGNOSIS — D631 Anemia in chronic kidney disease: Secondary | ICD-10-CM | POA: Diagnosis not present

## 2017-06-23 DIAGNOSIS — N2581 Secondary hyperparathyroidism of renal origin: Secondary | ICD-10-CM | POA: Diagnosis not present

## 2017-06-23 DIAGNOSIS — Z992 Dependence on renal dialysis: Secondary | ICD-10-CM | POA: Diagnosis not present

## 2017-06-24 DIAGNOSIS — Z992 Dependence on renal dialysis: Secondary | ICD-10-CM | POA: Diagnosis not present

## 2017-06-24 DIAGNOSIS — D509 Iron deficiency anemia, unspecified: Secondary | ICD-10-CM | POA: Diagnosis not present

## 2017-06-24 DIAGNOSIS — N186 End stage renal disease: Secondary | ICD-10-CM | POA: Diagnosis not present

## 2017-06-24 DIAGNOSIS — N2581 Secondary hyperparathyroidism of renal origin: Secondary | ICD-10-CM | POA: Diagnosis not present

## 2017-06-24 DIAGNOSIS — D631 Anemia in chronic kidney disease: Secondary | ICD-10-CM | POA: Diagnosis not present

## 2017-06-25 DIAGNOSIS — N186 End stage renal disease: Secondary | ICD-10-CM | POA: Diagnosis not present

## 2017-06-25 DIAGNOSIS — N2581 Secondary hyperparathyroidism of renal origin: Secondary | ICD-10-CM | POA: Diagnosis not present

## 2017-06-25 DIAGNOSIS — Z992 Dependence on renal dialysis: Secondary | ICD-10-CM | POA: Diagnosis not present

## 2017-06-25 DIAGNOSIS — D509 Iron deficiency anemia, unspecified: Secondary | ICD-10-CM | POA: Diagnosis not present

## 2017-06-25 DIAGNOSIS — D631 Anemia in chronic kidney disease: Secondary | ICD-10-CM | POA: Diagnosis not present

## 2017-06-26 DIAGNOSIS — D509 Iron deficiency anemia, unspecified: Secondary | ICD-10-CM | POA: Diagnosis not present

## 2017-06-26 DIAGNOSIS — N186 End stage renal disease: Secondary | ICD-10-CM | POA: Diagnosis not present

## 2017-06-26 DIAGNOSIS — Z992 Dependence on renal dialysis: Secondary | ICD-10-CM | POA: Diagnosis not present

## 2017-06-26 DIAGNOSIS — E559 Vitamin D deficiency, unspecified: Secondary | ICD-10-CM | POA: Diagnosis not present

## 2017-06-26 DIAGNOSIS — D631 Anemia in chronic kidney disease: Secondary | ICD-10-CM | POA: Diagnosis not present

## 2017-06-26 DIAGNOSIS — N250 Renal osteodystrophy: Secondary | ICD-10-CM | POA: Diagnosis not present

## 2017-06-27 DIAGNOSIS — E119 Type 2 diabetes mellitus without complications: Secondary | ICD-10-CM | POA: Diagnosis not present

## 2017-06-27 DIAGNOSIS — N250 Renal osteodystrophy: Secondary | ICD-10-CM | POA: Diagnosis not present

## 2017-06-27 DIAGNOSIS — D509 Iron deficiency anemia, unspecified: Secondary | ICD-10-CM | POA: Diagnosis not present

## 2017-06-27 DIAGNOSIS — N186 End stage renal disease: Secondary | ICD-10-CM | POA: Diagnosis not present

## 2017-06-27 DIAGNOSIS — D631 Anemia in chronic kidney disease: Secondary | ICD-10-CM | POA: Diagnosis not present

## 2017-06-27 DIAGNOSIS — E559 Vitamin D deficiency, unspecified: Secondary | ICD-10-CM | POA: Diagnosis not present

## 2017-06-27 DIAGNOSIS — Z992 Dependence on renal dialysis: Secondary | ICD-10-CM | POA: Diagnosis not present

## 2017-06-27 DIAGNOSIS — Z794 Long term (current) use of insulin: Secondary | ICD-10-CM | POA: Diagnosis not present

## 2017-06-28 DIAGNOSIS — D509 Iron deficiency anemia, unspecified: Secondary | ICD-10-CM | POA: Diagnosis not present

## 2017-06-28 DIAGNOSIS — D631 Anemia in chronic kidney disease: Secondary | ICD-10-CM | POA: Diagnosis not present

## 2017-06-28 DIAGNOSIS — E559 Vitamin D deficiency, unspecified: Secondary | ICD-10-CM | POA: Diagnosis not present

## 2017-06-28 DIAGNOSIS — Z992 Dependence on renal dialysis: Secondary | ICD-10-CM | POA: Diagnosis not present

## 2017-06-28 DIAGNOSIS — N250 Renal osteodystrophy: Secondary | ICD-10-CM | POA: Diagnosis not present

## 2017-06-28 DIAGNOSIS — N186 End stage renal disease: Secondary | ICD-10-CM | POA: Diagnosis not present

## 2017-06-29 DIAGNOSIS — D631 Anemia in chronic kidney disease: Secondary | ICD-10-CM | POA: Diagnosis not present

## 2017-06-29 DIAGNOSIS — E559 Vitamin D deficiency, unspecified: Secondary | ICD-10-CM | POA: Diagnosis not present

## 2017-06-29 DIAGNOSIS — Z992 Dependence on renal dialysis: Secondary | ICD-10-CM | POA: Diagnosis not present

## 2017-06-29 DIAGNOSIS — N250 Renal osteodystrophy: Secondary | ICD-10-CM | POA: Diagnosis not present

## 2017-06-29 DIAGNOSIS — D509 Iron deficiency anemia, unspecified: Secondary | ICD-10-CM | POA: Diagnosis not present

## 2017-06-29 DIAGNOSIS — N186 End stage renal disease: Secondary | ICD-10-CM | POA: Diagnosis not present

## 2017-06-30 DIAGNOSIS — Z992 Dependence on renal dialysis: Secondary | ICD-10-CM | POA: Diagnosis not present

## 2017-06-30 DIAGNOSIS — D509 Iron deficiency anemia, unspecified: Secondary | ICD-10-CM | POA: Diagnosis not present

## 2017-06-30 DIAGNOSIS — E559 Vitamin D deficiency, unspecified: Secondary | ICD-10-CM | POA: Diagnosis not present

## 2017-06-30 DIAGNOSIS — N250 Renal osteodystrophy: Secondary | ICD-10-CM | POA: Diagnosis not present

## 2017-06-30 DIAGNOSIS — N186 End stage renal disease: Secondary | ICD-10-CM | POA: Diagnosis not present

## 2017-06-30 DIAGNOSIS — D631 Anemia in chronic kidney disease: Secondary | ICD-10-CM | POA: Diagnosis not present

## 2017-07-01 DIAGNOSIS — D631 Anemia in chronic kidney disease: Secondary | ICD-10-CM | POA: Diagnosis not present

## 2017-07-01 DIAGNOSIS — N186 End stage renal disease: Secondary | ICD-10-CM | POA: Diagnosis not present

## 2017-07-01 DIAGNOSIS — Z992 Dependence on renal dialysis: Secondary | ICD-10-CM | POA: Diagnosis not present

## 2017-07-01 DIAGNOSIS — D509 Iron deficiency anemia, unspecified: Secondary | ICD-10-CM | POA: Diagnosis not present

## 2017-07-01 DIAGNOSIS — N250 Renal osteodystrophy: Secondary | ICD-10-CM | POA: Diagnosis not present

## 2017-07-01 DIAGNOSIS — E559 Vitamin D deficiency, unspecified: Secondary | ICD-10-CM | POA: Diagnosis not present

## 2017-07-02 DIAGNOSIS — N186 End stage renal disease: Secondary | ICD-10-CM | POA: Diagnosis not present

## 2017-07-02 DIAGNOSIS — E559 Vitamin D deficiency, unspecified: Secondary | ICD-10-CM | POA: Diagnosis not present

## 2017-07-02 DIAGNOSIS — Z992 Dependence on renal dialysis: Secondary | ICD-10-CM | POA: Diagnosis not present

## 2017-07-02 DIAGNOSIS — N250 Renal osteodystrophy: Secondary | ICD-10-CM | POA: Diagnosis not present

## 2017-07-02 DIAGNOSIS — D509 Iron deficiency anemia, unspecified: Secondary | ICD-10-CM | POA: Diagnosis not present

## 2017-07-02 DIAGNOSIS — D631 Anemia in chronic kidney disease: Secondary | ICD-10-CM | POA: Diagnosis not present

## 2017-07-03 DIAGNOSIS — D631 Anemia in chronic kidney disease: Secondary | ICD-10-CM | POA: Diagnosis not present

## 2017-07-03 DIAGNOSIS — N250 Renal osteodystrophy: Secondary | ICD-10-CM | POA: Diagnosis not present

## 2017-07-03 DIAGNOSIS — N186 End stage renal disease: Secondary | ICD-10-CM | POA: Diagnosis not present

## 2017-07-03 DIAGNOSIS — D509 Iron deficiency anemia, unspecified: Secondary | ICD-10-CM | POA: Diagnosis not present

## 2017-07-03 DIAGNOSIS — E559 Vitamin D deficiency, unspecified: Secondary | ICD-10-CM | POA: Diagnosis not present

## 2017-07-03 DIAGNOSIS — Z992 Dependence on renal dialysis: Secondary | ICD-10-CM | POA: Diagnosis not present

## 2017-07-04 DIAGNOSIS — D631 Anemia in chronic kidney disease: Secondary | ICD-10-CM | POA: Diagnosis not present

## 2017-07-04 DIAGNOSIS — N186 End stage renal disease: Secondary | ICD-10-CM | POA: Diagnosis not present

## 2017-07-04 DIAGNOSIS — Z992 Dependence on renal dialysis: Secondary | ICD-10-CM | POA: Diagnosis not present

## 2017-07-04 DIAGNOSIS — E559 Vitamin D deficiency, unspecified: Secondary | ICD-10-CM | POA: Diagnosis not present

## 2017-07-04 DIAGNOSIS — D509 Iron deficiency anemia, unspecified: Secondary | ICD-10-CM | POA: Diagnosis not present

## 2017-07-04 DIAGNOSIS — N250 Renal osteodystrophy: Secondary | ICD-10-CM | POA: Diagnosis not present

## 2017-07-05 DIAGNOSIS — N186 End stage renal disease: Secondary | ICD-10-CM | POA: Diagnosis not present

## 2017-07-05 DIAGNOSIS — D631 Anemia in chronic kidney disease: Secondary | ICD-10-CM | POA: Diagnosis not present

## 2017-07-05 DIAGNOSIS — N250 Renal osteodystrophy: Secondary | ICD-10-CM | POA: Diagnosis not present

## 2017-07-05 DIAGNOSIS — Z992 Dependence on renal dialysis: Secondary | ICD-10-CM | POA: Diagnosis not present

## 2017-07-05 DIAGNOSIS — E559 Vitamin D deficiency, unspecified: Secondary | ICD-10-CM | POA: Diagnosis not present

## 2017-07-05 DIAGNOSIS — D509 Iron deficiency anemia, unspecified: Secondary | ICD-10-CM | POA: Diagnosis not present

## 2017-07-06 DIAGNOSIS — D631 Anemia in chronic kidney disease: Secondary | ICD-10-CM | POA: Diagnosis not present

## 2017-07-06 DIAGNOSIS — Z992 Dependence on renal dialysis: Secondary | ICD-10-CM | POA: Diagnosis not present

## 2017-07-06 DIAGNOSIS — E559 Vitamin D deficiency, unspecified: Secondary | ICD-10-CM | POA: Diagnosis not present

## 2017-07-06 DIAGNOSIS — N250 Renal osteodystrophy: Secondary | ICD-10-CM | POA: Diagnosis not present

## 2017-07-06 DIAGNOSIS — D509 Iron deficiency anemia, unspecified: Secondary | ICD-10-CM | POA: Diagnosis not present

## 2017-07-06 DIAGNOSIS — N186 End stage renal disease: Secondary | ICD-10-CM | POA: Diagnosis not present

## 2017-07-07 DIAGNOSIS — D631 Anemia in chronic kidney disease: Secondary | ICD-10-CM | POA: Diagnosis not present

## 2017-07-07 DIAGNOSIS — N186 End stage renal disease: Secondary | ICD-10-CM | POA: Diagnosis not present

## 2017-07-07 DIAGNOSIS — D509 Iron deficiency anemia, unspecified: Secondary | ICD-10-CM | POA: Diagnosis not present

## 2017-07-07 DIAGNOSIS — E559 Vitamin D deficiency, unspecified: Secondary | ICD-10-CM | POA: Diagnosis not present

## 2017-07-07 DIAGNOSIS — N250 Renal osteodystrophy: Secondary | ICD-10-CM | POA: Diagnosis not present

## 2017-07-07 DIAGNOSIS — Z992 Dependence on renal dialysis: Secondary | ICD-10-CM | POA: Diagnosis not present

## 2017-07-08 DIAGNOSIS — D509 Iron deficiency anemia, unspecified: Secondary | ICD-10-CM | POA: Diagnosis not present

## 2017-07-08 DIAGNOSIS — E559 Vitamin D deficiency, unspecified: Secondary | ICD-10-CM | POA: Diagnosis not present

## 2017-07-08 DIAGNOSIS — Z992 Dependence on renal dialysis: Secondary | ICD-10-CM | POA: Diagnosis not present

## 2017-07-08 DIAGNOSIS — N186 End stage renal disease: Secondary | ICD-10-CM | POA: Diagnosis not present

## 2017-07-08 DIAGNOSIS — N250 Renal osteodystrophy: Secondary | ICD-10-CM | POA: Diagnosis not present

## 2017-07-08 DIAGNOSIS — D631 Anemia in chronic kidney disease: Secondary | ICD-10-CM | POA: Diagnosis not present

## 2017-07-09 DIAGNOSIS — N186 End stage renal disease: Secondary | ICD-10-CM | POA: Diagnosis not present

## 2017-07-09 DIAGNOSIS — Z992 Dependence on renal dialysis: Secondary | ICD-10-CM | POA: Diagnosis not present

## 2017-07-09 DIAGNOSIS — E559 Vitamin D deficiency, unspecified: Secondary | ICD-10-CM | POA: Diagnosis not present

## 2017-07-09 DIAGNOSIS — D631 Anemia in chronic kidney disease: Secondary | ICD-10-CM | POA: Diagnosis not present

## 2017-07-09 DIAGNOSIS — D509 Iron deficiency anemia, unspecified: Secondary | ICD-10-CM | POA: Diagnosis not present

## 2017-07-09 DIAGNOSIS — N250 Renal osteodystrophy: Secondary | ICD-10-CM | POA: Diagnosis not present

## 2017-07-10 DIAGNOSIS — N250 Renal osteodystrophy: Secondary | ICD-10-CM | POA: Diagnosis not present

## 2017-07-10 DIAGNOSIS — N186 End stage renal disease: Secondary | ICD-10-CM | POA: Diagnosis not present

## 2017-07-10 DIAGNOSIS — Z992 Dependence on renal dialysis: Secondary | ICD-10-CM | POA: Diagnosis not present

## 2017-07-10 DIAGNOSIS — D509 Iron deficiency anemia, unspecified: Secondary | ICD-10-CM | POA: Diagnosis not present

## 2017-07-10 DIAGNOSIS — D631 Anemia in chronic kidney disease: Secondary | ICD-10-CM | POA: Diagnosis not present

## 2017-07-10 DIAGNOSIS — E559 Vitamin D deficiency, unspecified: Secondary | ICD-10-CM | POA: Diagnosis not present

## 2017-07-11 DIAGNOSIS — N250 Renal osteodystrophy: Secondary | ICD-10-CM | POA: Diagnosis not present

## 2017-07-11 DIAGNOSIS — N186 End stage renal disease: Secondary | ICD-10-CM | POA: Diagnosis not present

## 2017-07-11 DIAGNOSIS — D509 Iron deficiency anemia, unspecified: Secondary | ICD-10-CM | POA: Diagnosis not present

## 2017-07-11 DIAGNOSIS — E559 Vitamin D deficiency, unspecified: Secondary | ICD-10-CM | POA: Diagnosis not present

## 2017-07-11 DIAGNOSIS — Z992 Dependence on renal dialysis: Secondary | ICD-10-CM | POA: Diagnosis not present

## 2017-07-11 DIAGNOSIS — D631 Anemia in chronic kidney disease: Secondary | ICD-10-CM | POA: Diagnosis not present

## 2017-07-12 DIAGNOSIS — Z992 Dependence on renal dialysis: Secondary | ICD-10-CM | POA: Diagnosis not present

## 2017-07-12 DIAGNOSIS — N250 Renal osteodystrophy: Secondary | ICD-10-CM | POA: Diagnosis not present

## 2017-07-12 DIAGNOSIS — D631 Anemia in chronic kidney disease: Secondary | ICD-10-CM | POA: Diagnosis not present

## 2017-07-12 DIAGNOSIS — E559 Vitamin D deficiency, unspecified: Secondary | ICD-10-CM | POA: Diagnosis not present

## 2017-07-12 DIAGNOSIS — D509 Iron deficiency anemia, unspecified: Secondary | ICD-10-CM | POA: Diagnosis not present

## 2017-07-12 DIAGNOSIS — N186 End stage renal disease: Secondary | ICD-10-CM | POA: Diagnosis not present

## 2017-07-13 DIAGNOSIS — Z992 Dependence on renal dialysis: Secondary | ICD-10-CM | POA: Diagnosis not present

## 2017-07-13 DIAGNOSIS — E559 Vitamin D deficiency, unspecified: Secondary | ICD-10-CM | POA: Diagnosis not present

## 2017-07-13 DIAGNOSIS — D509 Iron deficiency anemia, unspecified: Secondary | ICD-10-CM | POA: Diagnosis not present

## 2017-07-13 DIAGNOSIS — N186 End stage renal disease: Secondary | ICD-10-CM | POA: Diagnosis not present

## 2017-07-13 DIAGNOSIS — N250 Renal osteodystrophy: Secondary | ICD-10-CM | POA: Diagnosis not present

## 2017-07-13 DIAGNOSIS — D631 Anemia in chronic kidney disease: Secondary | ICD-10-CM | POA: Diagnosis not present

## 2017-07-14 DIAGNOSIS — N186 End stage renal disease: Secondary | ICD-10-CM | POA: Diagnosis not present

## 2017-07-14 DIAGNOSIS — N250 Renal osteodystrophy: Secondary | ICD-10-CM | POA: Diagnosis not present

## 2017-07-14 DIAGNOSIS — Z992 Dependence on renal dialysis: Secondary | ICD-10-CM | POA: Diagnosis not present

## 2017-07-14 DIAGNOSIS — D509 Iron deficiency anemia, unspecified: Secondary | ICD-10-CM | POA: Diagnosis not present

## 2017-07-14 DIAGNOSIS — D631 Anemia in chronic kidney disease: Secondary | ICD-10-CM | POA: Diagnosis not present

## 2017-07-14 DIAGNOSIS — E559 Vitamin D deficiency, unspecified: Secondary | ICD-10-CM | POA: Diagnosis not present

## 2017-07-15 DIAGNOSIS — N186 End stage renal disease: Secondary | ICD-10-CM | POA: Diagnosis not present

## 2017-07-15 DIAGNOSIS — Z992 Dependence on renal dialysis: Secondary | ICD-10-CM | POA: Diagnosis not present

## 2017-07-15 DIAGNOSIS — E559 Vitamin D deficiency, unspecified: Secondary | ICD-10-CM | POA: Diagnosis not present

## 2017-07-15 DIAGNOSIS — D509 Iron deficiency anemia, unspecified: Secondary | ICD-10-CM | POA: Diagnosis not present

## 2017-07-15 DIAGNOSIS — N250 Renal osteodystrophy: Secondary | ICD-10-CM | POA: Diagnosis not present

## 2017-07-15 DIAGNOSIS — D631 Anemia in chronic kidney disease: Secondary | ICD-10-CM | POA: Diagnosis not present

## 2017-07-16 DIAGNOSIS — E559 Vitamin D deficiency, unspecified: Secondary | ICD-10-CM | POA: Diagnosis not present

## 2017-07-16 DIAGNOSIS — D631 Anemia in chronic kidney disease: Secondary | ICD-10-CM | POA: Diagnosis not present

## 2017-07-16 DIAGNOSIS — D509 Iron deficiency anemia, unspecified: Secondary | ICD-10-CM | POA: Diagnosis not present

## 2017-07-16 DIAGNOSIS — Z992 Dependence on renal dialysis: Secondary | ICD-10-CM | POA: Diagnosis not present

## 2017-07-16 DIAGNOSIS — N186 End stage renal disease: Secondary | ICD-10-CM | POA: Diagnosis not present

## 2017-07-16 DIAGNOSIS — N250 Renal osteodystrophy: Secondary | ICD-10-CM | POA: Diagnosis not present

## 2017-07-17 DIAGNOSIS — N250 Renal osteodystrophy: Secondary | ICD-10-CM | POA: Diagnosis not present

## 2017-07-17 DIAGNOSIS — E559 Vitamin D deficiency, unspecified: Secondary | ICD-10-CM | POA: Diagnosis not present

## 2017-07-17 DIAGNOSIS — D631 Anemia in chronic kidney disease: Secondary | ICD-10-CM | POA: Diagnosis not present

## 2017-07-17 DIAGNOSIS — N186 End stage renal disease: Secondary | ICD-10-CM | POA: Diagnosis not present

## 2017-07-17 DIAGNOSIS — Z992 Dependence on renal dialysis: Secondary | ICD-10-CM | POA: Diagnosis not present

## 2017-07-17 DIAGNOSIS — D509 Iron deficiency anemia, unspecified: Secondary | ICD-10-CM | POA: Diagnosis not present

## 2017-07-18 DIAGNOSIS — E559 Vitamin D deficiency, unspecified: Secondary | ICD-10-CM | POA: Diagnosis not present

## 2017-07-18 DIAGNOSIS — N250 Renal osteodystrophy: Secondary | ICD-10-CM | POA: Diagnosis not present

## 2017-07-18 DIAGNOSIS — N186 End stage renal disease: Secondary | ICD-10-CM | POA: Diagnosis not present

## 2017-07-18 DIAGNOSIS — Z992 Dependence on renal dialysis: Secondary | ICD-10-CM | POA: Diagnosis not present

## 2017-07-18 DIAGNOSIS — D631 Anemia in chronic kidney disease: Secondary | ICD-10-CM | POA: Diagnosis not present

## 2017-07-18 DIAGNOSIS — D509 Iron deficiency anemia, unspecified: Secondary | ICD-10-CM | POA: Diagnosis not present

## 2017-07-19 DIAGNOSIS — D509 Iron deficiency anemia, unspecified: Secondary | ICD-10-CM | POA: Diagnosis not present

## 2017-07-19 DIAGNOSIS — E559 Vitamin D deficiency, unspecified: Secondary | ICD-10-CM | POA: Diagnosis not present

## 2017-07-19 DIAGNOSIS — Z992 Dependence on renal dialysis: Secondary | ICD-10-CM | POA: Diagnosis not present

## 2017-07-19 DIAGNOSIS — N186 End stage renal disease: Secondary | ICD-10-CM | POA: Diagnosis not present

## 2017-07-19 DIAGNOSIS — N250 Renal osteodystrophy: Secondary | ICD-10-CM | POA: Diagnosis not present

## 2017-07-19 DIAGNOSIS — D631 Anemia in chronic kidney disease: Secondary | ICD-10-CM | POA: Diagnosis not present

## 2017-07-20 DIAGNOSIS — Z992 Dependence on renal dialysis: Secondary | ICD-10-CM | POA: Diagnosis not present

## 2017-07-20 DIAGNOSIS — N186 End stage renal disease: Secondary | ICD-10-CM | POA: Diagnosis not present

## 2017-07-20 DIAGNOSIS — D509 Iron deficiency anemia, unspecified: Secondary | ICD-10-CM | POA: Diagnosis not present

## 2017-07-20 DIAGNOSIS — N250 Renal osteodystrophy: Secondary | ICD-10-CM | POA: Diagnosis not present

## 2017-07-20 DIAGNOSIS — D631 Anemia in chronic kidney disease: Secondary | ICD-10-CM | POA: Diagnosis not present

## 2017-07-20 DIAGNOSIS — E559 Vitamin D deficiency, unspecified: Secondary | ICD-10-CM | POA: Diagnosis not present

## 2017-07-21 DIAGNOSIS — D509 Iron deficiency anemia, unspecified: Secondary | ICD-10-CM | POA: Diagnosis not present

## 2017-07-21 DIAGNOSIS — N250 Renal osteodystrophy: Secondary | ICD-10-CM | POA: Diagnosis not present

## 2017-07-21 DIAGNOSIS — D631 Anemia in chronic kidney disease: Secondary | ICD-10-CM | POA: Diagnosis not present

## 2017-07-21 DIAGNOSIS — Z992 Dependence on renal dialysis: Secondary | ICD-10-CM | POA: Diagnosis not present

## 2017-07-21 DIAGNOSIS — E559 Vitamin D deficiency, unspecified: Secondary | ICD-10-CM | POA: Diagnosis not present

## 2017-07-21 DIAGNOSIS — N186 End stage renal disease: Secondary | ICD-10-CM | POA: Diagnosis not present

## 2017-07-22 DIAGNOSIS — N250 Renal osteodystrophy: Secondary | ICD-10-CM | POA: Diagnosis not present

## 2017-07-22 DIAGNOSIS — E559 Vitamin D deficiency, unspecified: Secondary | ICD-10-CM | POA: Diagnosis not present

## 2017-07-22 DIAGNOSIS — D509 Iron deficiency anemia, unspecified: Secondary | ICD-10-CM | POA: Diagnosis not present

## 2017-07-22 DIAGNOSIS — N186 End stage renal disease: Secondary | ICD-10-CM | POA: Diagnosis not present

## 2017-07-22 DIAGNOSIS — D631 Anemia in chronic kidney disease: Secondary | ICD-10-CM | POA: Diagnosis not present

## 2017-07-22 DIAGNOSIS — Z992 Dependence on renal dialysis: Secondary | ICD-10-CM | POA: Diagnosis not present

## 2017-07-23 DIAGNOSIS — D631 Anemia in chronic kidney disease: Secondary | ICD-10-CM | POA: Diagnosis not present

## 2017-07-23 DIAGNOSIS — Z992 Dependence on renal dialysis: Secondary | ICD-10-CM | POA: Diagnosis not present

## 2017-07-23 DIAGNOSIS — N186 End stage renal disease: Secondary | ICD-10-CM | POA: Diagnosis not present

## 2017-07-23 DIAGNOSIS — D509 Iron deficiency anemia, unspecified: Secondary | ICD-10-CM | POA: Diagnosis not present

## 2017-07-23 DIAGNOSIS — E559 Vitamin D deficiency, unspecified: Secondary | ICD-10-CM | POA: Diagnosis not present

## 2017-07-23 DIAGNOSIS — N250 Renal osteodystrophy: Secondary | ICD-10-CM | POA: Diagnosis not present

## 2017-07-24 DIAGNOSIS — D631 Anemia in chronic kidney disease: Secondary | ICD-10-CM | POA: Diagnosis not present

## 2017-07-24 DIAGNOSIS — Z992 Dependence on renal dialysis: Secondary | ICD-10-CM | POA: Diagnosis not present

## 2017-07-24 DIAGNOSIS — E559 Vitamin D deficiency, unspecified: Secondary | ICD-10-CM | POA: Diagnosis not present

## 2017-07-24 DIAGNOSIS — D509 Iron deficiency anemia, unspecified: Secondary | ICD-10-CM | POA: Diagnosis not present

## 2017-07-24 DIAGNOSIS — N186 End stage renal disease: Secondary | ICD-10-CM | POA: Diagnosis not present

## 2017-07-24 DIAGNOSIS — N250 Renal osteodystrophy: Secondary | ICD-10-CM | POA: Diagnosis not present

## 2017-07-25 DIAGNOSIS — N250 Renal osteodystrophy: Secondary | ICD-10-CM | POA: Diagnosis not present

## 2017-07-25 DIAGNOSIS — N186 End stage renal disease: Secondary | ICD-10-CM | POA: Diagnosis not present

## 2017-07-25 DIAGNOSIS — E559 Vitamin D deficiency, unspecified: Secondary | ICD-10-CM | POA: Diagnosis not present

## 2017-07-25 DIAGNOSIS — D509 Iron deficiency anemia, unspecified: Secondary | ICD-10-CM | POA: Diagnosis not present

## 2017-07-25 DIAGNOSIS — D631 Anemia in chronic kidney disease: Secondary | ICD-10-CM | POA: Diagnosis not present

## 2017-07-25 DIAGNOSIS — Z992 Dependence on renal dialysis: Secondary | ICD-10-CM | POA: Diagnosis not present

## 2017-07-26 DIAGNOSIS — Z992 Dependence on renal dialysis: Secondary | ICD-10-CM | POA: Diagnosis not present

## 2017-07-26 DIAGNOSIS — D631 Anemia in chronic kidney disease: Secondary | ICD-10-CM | POA: Diagnosis not present

## 2017-07-26 DIAGNOSIS — N2581 Secondary hyperparathyroidism of renal origin: Secondary | ICD-10-CM | POA: Diagnosis not present

## 2017-07-26 DIAGNOSIS — D509 Iron deficiency anemia, unspecified: Secondary | ICD-10-CM | POA: Diagnosis not present

## 2017-07-26 DIAGNOSIS — N186 End stage renal disease: Secondary | ICD-10-CM | POA: Diagnosis not present

## 2017-07-27 DIAGNOSIS — Z992 Dependence on renal dialysis: Secondary | ICD-10-CM | POA: Diagnosis not present

## 2017-07-27 DIAGNOSIS — N186 End stage renal disease: Secondary | ICD-10-CM | POA: Diagnosis not present

## 2017-07-27 DIAGNOSIS — D509 Iron deficiency anemia, unspecified: Secondary | ICD-10-CM | POA: Diagnosis not present

## 2017-07-27 DIAGNOSIS — D631 Anemia in chronic kidney disease: Secondary | ICD-10-CM | POA: Diagnosis not present

## 2017-07-27 DIAGNOSIS — N2581 Secondary hyperparathyroidism of renal origin: Secondary | ICD-10-CM | POA: Diagnosis not present

## 2017-07-28 DIAGNOSIS — Z992 Dependence on renal dialysis: Secondary | ICD-10-CM | POA: Diagnosis not present

## 2017-07-28 DIAGNOSIS — D631 Anemia in chronic kidney disease: Secondary | ICD-10-CM | POA: Diagnosis not present

## 2017-07-28 DIAGNOSIS — D509 Iron deficiency anemia, unspecified: Secondary | ICD-10-CM | POA: Diagnosis not present

## 2017-07-28 DIAGNOSIS — N2581 Secondary hyperparathyroidism of renal origin: Secondary | ICD-10-CM | POA: Diagnosis not present

## 2017-07-28 DIAGNOSIS — N186 End stage renal disease: Secondary | ICD-10-CM | POA: Diagnosis not present

## 2017-07-29 DIAGNOSIS — N186 End stage renal disease: Secondary | ICD-10-CM | POA: Diagnosis not present

## 2017-07-29 DIAGNOSIS — D631 Anemia in chronic kidney disease: Secondary | ICD-10-CM | POA: Diagnosis not present

## 2017-07-29 DIAGNOSIS — N2581 Secondary hyperparathyroidism of renal origin: Secondary | ICD-10-CM | POA: Diagnosis not present

## 2017-07-29 DIAGNOSIS — Z992 Dependence on renal dialysis: Secondary | ICD-10-CM | POA: Diagnosis not present

## 2017-07-29 DIAGNOSIS — D509 Iron deficiency anemia, unspecified: Secondary | ICD-10-CM | POA: Diagnosis not present

## 2017-07-30 DIAGNOSIS — N186 End stage renal disease: Secondary | ICD-10-CM | POA: Diagnosis not present

## 2017-07-30 DIAGNOSIS — D509 Iron deficiency anemia, unspecified: Secondary | ICD-10-CM | POA: Diagnosis not present

## 2017-07-30 DIAGNOSIS — Z992 Dependence on renal dialysis: Secondary | ICD-10-CM | POA: Diagnosis not present

## 2017-07-30 DIAGNOSIS — D631 Anemia in chronic kidney disease: Secondary | ICD-10-CM | POA: Diagnosis not present

## 2017-07-30 DIAGNOSIS — N2581 Secondary hyperparathyroidism of renal origin: Secondary | ICD-10-CM | POA: Diagnosis not present

## 2017-07-31 DIAGNOSIS — Z992 Dependence on renal dialysis: Secondary | ICD-10-CM | POA: Diagnosis not present

## 2017-07-31 DIAGNOSIS — D631 Anemia in chronic kidney disease: Secondary | ICD-10-CM | POA: Diagnosis not present

## 2017-07-31 DIAGNOSIS — N186 End stage renal disease: Secondary | ICD-10-CM | POA: Diagnosis not present

## 2017-07-31 DIAGNOSIS — N2581 Secondary hyperparathyroidism of renal origin: Secondary | ICD-10-CM | POA: Diagnosis not present

## 2017-07-31 DIAGNOSIS — D509 Iron deficiency anemia, unspecified: Secondary | ICD-10-CM | POA: Diagnosis not present

## 2017-08-01 DIAGNOSIS — N2581 Secondary hyperparathyroidism of renal origin: Secondary | ICD-10-CM | POA: Diagnosis not present

## 2017-08-01 DIAGNOSIS — D631 Anemia in chronic kidney disease: Secondary | ICD-10-CM | POA: Diagnosis not present

## 2017-08-01 DIAGNOSIS — D509 Iron deficiency anemia, unspecified: Secondary | ICD-10-CM | POA: Diagnosis not present

## 2017-08-01 DIAGNOSIS — N186 End stage renal disease: Secondary | ICD-10-CM | POA: Diagnosis not present

## 2017-08-01 DIAGNOSIS — Z992 Dependence on renal dialysis: Secondary | ICD-10-CM | POA: Diagnosis not present

## 2017-08-02 DIAGNOSIS — N186 End stage renal disease: Secondary | ICD-10-CM | POA: Diagnosis not present

## 2017-08-02 DIAGNOSIS — N2581 Secondary hyperparathyroidism of renal origin: Secondary | ICD-10-CM | POA: Diagnosis not present

## 2017-08-02 DIAGNOSIS — D631 Anemia in chronic kidney disease: Secondary | ICD-10-CM | POA: Diagnosis not present

## 2017-08-02 DIAGNOSIS — Z992 Dependence on renal dialysis: Secondary | ICD-10-CM | POA: Diagnosis not present

## 2017-08-02 DIAGNOSIS — D509 Iron deficiency anemia, unspecified: Secondary | ICD-10-CM | POA: Diagnosis not present

## 2017-08-03 DIAGNOSIS — N2581 Secondary hyperparathyroidism of renal origin: Secondary | ICD-10-CM | POA: Diagnosis not present

## 2017-08-03 DIAGNOSIS — Z992 Dependence on renal dialysis: Secondary | ICD-10-CM | POA: Diagnosis not present

## 2017-08-03 DIAGNOSIS — D509 Iron deficiency anemia, unspecified: Secondary | ICD-10-CM | POA: Diagnosis not present

## 2017-08-03 DIAGNOSIS — N186 End stage renal disease: Secondary | ICD-10-CM | POA: Diagnosis not present

## 2017-08-03 DIAGNOSIS — D631 Anemia in chronic kidney disease: Secondary | ICD-10-CM | POA: Diagnosis not present

## 2017-08-04 DIAGNOSIS — D509 Iron deficiency anemia, unspecified: Secondary | ICD-10-CM | POA: Diagnosis not present

## 2017-08-04 DIAGNOSIS — N2581 Secondary hyperparathyroidism of renal origin: Secondary | ICD-10-CM | POA: Diagnosis not present

## 2017-08-04 DIAGNOSIS — N186 End stage renal disease: Secondary | ICD-10-CM | POA: Diagnosis not present

## 2017-08-04 DIAGNOSIS — D631 Anemia in chronic kidney disease: Secondary | ICD-10-CM | POA: Diagnosis not present

## 2017-08-04 DIAGNOSIS — Z992 Dependence on renal dialysis: Secondary | ICD-10-CM | POA: Diagnosis not present

## 2017-08-05 DIAGNOSIS — D631 Anemia in chronic kidney disease: Secondary | ICD-10-CM | POA: Diagnosis not present

## 2017-08-05 DIAGNOSIS — N186 End stage renal disease: Secondary | ICD-10-CM | POA: Diagnosis not present

## 2017-08-05 DIAGNOSIS — N2581 Secondary hyperparathyroidism of renal origin: Secondary | ICD-10-CM | POA: Diagnosis not present

## 2017-08-05 DIAGNOSIS — D509 Iron deficiency anemia, unspecified: Secondary | ICD-10-CM | POA: Diagnosis not present

## 2017-08-05 DIAGNOSIS — Z992 Dependence on renal dialysis: Secondary | ICD-10-CM | POA: Diagnosis not present

## 2017-08-06 DIAGNOSIS — N186 End stage renal disease: Secondary | ICD-10-CM | POA: Diagnosis not present

## 2017-08-06 DIAGNOSIS — N2581 Secondary hyperparathyroidism of renal origin: Secondary | ICD-10-CM | POA: Diagnosis not present

## 2017-08-06 DIAGNOSIS — D509 Iron deficiency anemia, unspecified: Secondary | ICD-10-CM | POA: Diagnosis not present

## 2017-08-06 DIAGNOSIS — D631 Anemia in chronic kidney disease: Secondary | ICD-10-CM | POA: Diagnosis not present

## 2017-08-06 DIAGNOSIS — Z992 Dependence on renal dialysis: Secondary | ICD-10-CM | POA: Diagnosis not present

## 2017-08-07 DIAGNOSIS — Z992 Dependence on renal dialysis: Secondary | ICD-10-CM | POA: Diagnosis not present

## 2017-08-07 DIAGNOSIS — N186 End stage renal disease: Secondary | ICD-10-CM | POA: Diagnosis not present

## 2017-08-07 DIAGNOSIS — D509 Iron deficiency anemia, unspecified: Secondary | ICD-10-CM | POA: Diagnosis not present

## 2017-08-07 DIAGNOSIS — D631 Anemia in chronic kidney disease: Secondary | ICD-10-CM | POA: Diagnosis not present

## 2017-08-07 DIAGNOSIS — N2581 Secondary hyperparathyroidism of renal origin: Secondary | ICD-10-CM | POA: Diagnosis not present

## 2017-08-08 DIAGNOSIS — D509 Iron deficiency anemia, unspecified: Secondary | ICD-10-CM | POA: Diagnosis not present

## 2017-08-08 DIAGNOSIS — N186 End stage renal disease: Secondary | ICD-10-CM | POA: Diagnosis not present

## 2017-08-08 DIAGNOSIS — N2581 Secondary hyperparathyroidism of renal origin: Secondary | ICD-10-CM | POA: Diagnosis not present

## 2017-08-08 DIAGNOSIS — D631 Anemia in chronic kidney disease: Secondary | ICD-10-CM | POA: Diagnosis not present

## 2017-08-08 DIAGNOSIS — Z992 Dependence on renal dialysis: Secondary | ICD-10-CM | POA: Diagnosis not present

## 2017-08-09 DIAGNOSIS — Z992 Dependence on renal dialysis: Secondary | ICD-10-CM | POA: Diagnosis not present

## 2017-08-09 DIAGNOSIS — D509 Iron deficiency anemia, unspecified: Secondary | ICD-10-CM | POA: Diagnosis not present

## 2017-08-09 DIAGNOSIS — D631 Anemia in chronic kidney disease: Secondary | ICD-10-CM | POA: Diagnosis not present

## 2017-08-09 DIAGNOSIS — N186 End stage renal disease: Secondary | ICD-10-CM | POA: Diagnosis not present

## 2017-08-09 DIAGNOSIS — N2581 Secondary hyperparathyroidism of renal origin: Secondary | ICD-10-CM | POA: Diagnosis not present

## 2017-08-10 DIAGNOSIS — Z992 Dependence on renal dialysis: Secondary | ICD-10-CM | POA: Diagnosis not present

## 2017-08-10 DIAGNOSIS — N186 End stage renal disease: Secondary | ICD-10-CM | POA: Diagnosis not present

## 2017-08-10 DIAGNOSIS — N2581 Secondary hyperparathyroidism of renal origin: Secondary | ICD-10-CM | POA: Diagnosis not present

## 2017-08-10 DIAGNOSIS — D509 Iron deficiency anemia, unspecified: Secondary | ICD-10-CM | POA: Diagnosis not present

## 2017-08-10 DIAGNOSIS — D631 Anemia in chronic kidney disease: Secondary | ICD-10-CM | POA: Diagnosis not present

## 2017-08-11 DIAGNOSIS — Z992 Dependence on renal dialysis: Secondary | ICD-10-CM | POA: Diagnosis not present

## 2017-08-11 DIAGNOSIS — N2581 Secondary hyperparathyroidism of renal origin: Secondary | ICD-10-CM | POA: Diagnosis not present

## 2017-08-11 DIAGNOSIS — N186 End stage renal disease: Secondary | ICD-10-CM | POA: Diagnosis not present

## 2017-08-11 DIAGNOSIS — D631 Anemia in chronic kidney disease: Secondary | ICD-10-CM | POA: Diagnosis not present

## 2017-08-11 DIAGNOSIS — D509 Iron deficiency anemia, unspecified: Secondary | ICD-10-CM | POA: Diagnosis not present

## 2017-08-12 DIAGNOSIS — N2581 Secondary hyperparathyroidism of renal origin: Secondary | ICD-10-CM | POA: Diagnosis not present

## 2017-08-12 DIAGNOSIS — D509 Iron deficiency anemia, unspecified: Secondary | ICD-10-CM | POA: Diagnosis not present

## 2017-08-12 DIAGNOSIS — D631 Anemia in chronic kidney disease: Secondary | ICD-10-CM | POA: Diagnosis not present

## 2017-08-12 DIAGNOSIS — N186 End stage renal disease: Secondary | ICD-10-CM | POA: Diagnosis not present

## 2017-08-12 DIAGNOSIS — Z992 Dependence on renal dialysis: Secondary | ICD-10-CM | POA: Diagnosis not present

## 2017-08-13 DIAGNOSIS — D509 Iron deficiency anemia, unspecified: Secondary | ICD-10-CM | POA: Diagnosis not present

## 2017-08-13 DIAGNOSIS — D631 Anemia in chronic kidney disease: Secondary | ICD-10-CM | POA: Diagnosis not present

## 2017-08-13 DIAGNOSIS — N2581 Secondary hyperparathyroidism of renal origin: Secondary | ICD-10-CM | POA: Diagnosis not present

## 2017-08-13 DIAGNOSIS — Z992 Dependence on renal dialysis: Secondary | ICD-10-CM | POA: Diagnosis not present

## 2017-08-13 DIAGNOSIS — N186 End stage renal disease: Secondary | ICD-10-CM | POA: Diagnosis not present

## 2017-08-14 DIAGNOSIS — D631 Anemia in chronic kidney disease: Secondary | ICD-10-CM | POA: Diagnosis not present

## 2017-08-14 DIAGNOSIS — D509 Iron deficiency anemia, unspecified: Secondary | ICD-10-CM | POA: Diagnosis not present

## 2017-08-14 DIAGNOSIS — N2581 Secondary hyperparathyroidism of renal origin: Secondary | ICD-10-CM | POA: Diagnosis not present

## 2017-08-14 DIAGNOSIS — Z992 Dependence on renal dialysis: Secondary | ICD-10-CM | POA: Diagnosis not present

## 2017-08-14 DIAGNOSIS — N186 End stage renal disease: Secondary | ICD-10-CM | POA: Diagnosis not present

## 2017-08-15 DIAGNOSIS — Z992 Dependence on renal dialysis: Secondary | ICD-10-CM | POA: Diagnosis not present

## 2017-08-15 DIAGNOSIS — D509 Iron deficiency anemia, unspecified: Secondary | ICD-10-CM | POA: Diagnosis not present

## 2017-08-15 DIAGNOSIS — N2581 Secondary hyperparathyroidism of renal origin: Secondary | ICD-10-CM | POA: Diagnosis not present

## 2017-08-15 DIAGNOSIS — N186 End stage renal disease: Secondary | ICD-10-CM | POA: Diagnosis not present

## 2017-08-15 DIAGNOSIS — D631 Anemia in chronic kidney disease: Secondary | ICD-10-CM | POA: Diagnosis not present

## 2017-08-16 DIAGNOSIS — Z992 Dependence on renal dialysis: Secondary | ICD-10-CM | POA: Diagnosis not present

## 2017-08-16 DIAGNOSIS — D509 Iron deficiency anemia, unspecified: Secondary | ICD-10-CM | POA: Diagnosis not present

## 2017-08-16 DIAGNOSIS — D631 Anemia in chronic kidney disease: Secondary | ICD-10-CM | POA: Diagnosis not present

## 2017-08-16 DIAGNOSIS — N2581 Secondary hyperparathyroidism of renal origin: Secondary | ICD-10-CM | POA: Diagnosis not present

## 2017-08-16 DIAGNOSIS — N186 End stage renal disease: Secondary | ICD-10-CM | POA: Diagnosis not present

## 2017-08-17 DIAGNOSIS — D631 Anemia in chronic kidney disease: Secondary | ICD-10-CM | POA: Diagnosis not present

## 2017-08-17 DIAGNOSIS — Z992 Dependence on renal dialysis: Secondary | ICD-10-CM | POA: Diagnosis not present

## 2017-08-17 DIAGNOSIS — N186 End stage renal disease: Secondary | ICD-10-CM | POA: Diagnosis not present

## 2017-08-17 DIAGNOSIS — D509 Iron deficiency anemia, unspecified: Secondary | ICD-10-CM | POA: Diagnosis not present

## 2017-08-17 DIAGNOSIS — N2581 Secondary hyperparathyroidism of renal origin: Secondary | ICD-10-CM | POA: Diagnosis not present

## 2017-08-18 DIAGNOSIS — D509 Iron deficiency anemia, unspecified: Secondary | ICD-10-CM | POA: Diagnosis not present

## 2017-08-18 DIAGNOSIS — N2581 Secondary hyperparathyroidism of renal origin: Secondary | ICD-10-CM | POA: Diagnosis not present

## 2017-08-18 DIAGNOSIS — N186 End stage renal disease: Secondary | ICD-10-CM | POA: Diagnosis not present

## 2017-08-18 DIAGNOSIS — D631 Anemia in chronic kidney disease: Secondary | ICD-10-CM | POA: Diagnosis not present

## 2017-08-18 DIAGNOSIS — Z992 Dependence on renal dialysis: Secondary | ICD-10-CM | POA: Diagnosis not present

## 2017-08-19 DIAGNOSIS — D631 Anemia in chronic kidney disease: Secondary | ICD-10-CM | POA: Diagnosis not present

## 2017-08-19 DIAGNOSIS — N186 End stage renal disease: Secondary | ICD-10-CM | POA: Diagnosis not present

## 2017-08-19 DIAGNOSIS — N2581 Secondary hyperparathyroidism of renal origin: Secondary | ICD-10-CM | POA: Diagnosis not present

## 2017-08-19 DIAGNOSIS — Z992 Dependence on renal dialysis: Secondary | ICD-10-CM | POA: Diagnosis not present

## 2017-08-19 DIAGNOSIS — D509 Iron deficiency anemia, unspecified: Secondary | ICD-10-CM | POA: Diagnosis not present

## 2017-08-20 DIAGNOSIS — D509 Iron deficiency anemia, unspecified: Secondary | ICD-10-CM | POA: Diagnosis not present

## 2017-08-20 DIAGNOSIS — D631 Anemia in chronic kidney disease: Secondary | ICD-10-CM | POA: Diagnosis not present

## 2017-08-20 DIAGNOSIS — N186 End stage renal disease: Secondary | ICD-10-CM | POA: Diagnosis not present

## 2017-08-20 DIAGNOSIS — Z992 Dependence on renal dialysis: Secondary | ICD-10-CM | POA: Diagnosis not present

## 2017-08-20 DIAGNOSIS — N2581 Secondary hyperparathyroidism of renal origin: Secondary | ICD-10-CM | POA: Diagnosis not present

## 2017-08-21 DIAGNOSIS — N186 End stage renal disease: Secondary | ICD-10-CM | POA: Diagnosis not present

## 2017-08-21 DIAGNOSIS — Z992 Dependence on renal dialysis: Secondary | ICD-10-CM | POA: Diagnosis not present

## 2017-08-21 DIAGNOSIS — D509 Iron deficiency anemia, unspecified: Secondary | ICD-10-CM | POA: Diagnosis not present

## 2017-08-21 DIAGNOSIS — N2581 Secondary hyperparathyroidism of renal origin: Secondary | ICD-10-CM | POA: Diagnosis not present

## 2017-08-21 DIAGNOSIS — D631 Anemia in chronic kidney disease: Secondary | ICD-10-CM | POA: Diagnosis not present

## 2017-08-22 DIAGNOSIS — Z992 Dependence on renal dialysis: Secondary | ICD-10-CM | POA: Diagnosis not present

## 2017-08-22 DIAGNOSIS — D509 Iron deficiency anemia, unspecified: Secondary | ICD-10-CM | POA: Diagnosis not present

## 2017-08-22 DIAGNOSIS — N2581 Secondary hyperparathyroidism of renal origin: Secondary | ICD-10-CM | POA: Diagnosis not present

## 2017-08-22 DIAGNOSIS — D631 Anemia in chronic kidney disease: Secondary | ICD-10-CM | POA: Diagnosis not present

## 2017-08-22 DIAGNOSIS — N186 End stage renal disease: Secondary | ICD-10-CM | POA: Diagnosis not present

## 2017-08-23 DIAGNOSIS — D631 Anemia in chronic kidney disease: Secondary | ICD-10-CM | POA: Diagnosis not present

## 2017-08-23 DIAGNOSIS — N186 End stage renal disease: Secondary | ICD-10-CM | POA: Diagnosis not present

## 2017-08-23 DIAGNOSIS — D509 Iron deficiency anemia, unspecified: Secondary | ICD-10-CM | POA: Diagnosis not present

## 2017-08-23 DIAGNOSIS — Z992 Dependence on renal dialysis: Secondary | ICD-10-CM | POA: Diagnosis not present

## 2017-08-23 DIAGNOSIS — N2581 Secondary hyperparathyroidism of renal origin: Secondary | ICD-10-CM | POA: Diagnosis not present

## 2017-08-24 DIAGNOSIS — Z992 Dependence on renal dialysis: Secondary | ICD-10-CM | POA: Diagnosis not present

## 2017-08-24 DIAGNOSIS — D509 Iron deficiency anemia, unspecified: Secondary | ICD-10-CM | POA: Diagnosis not present

## 2017-08-24 DIAGNOSIS — N186 End stage renal disease: Secondary | ICD-10-CM | POA: Diagnosis not present

## 2017-08-24 DIAGNOSIS — N2581 Secondary hyperparathyroidism of renal origin: Secondary | ICD-10-CM | POA: Diagnosis not present

## 2017-08-24 DIAGNOSIS — D631 Anemia in chronic kidney disease: Secondary | ICD-10-CM | POA: Diagnosis not present

## 2017-08-25 DIAGNOSIS — N186 End stage renal disease: Secondary | ICD-10-CM | POA: Diagnosis not present

## 2017-08-25 DIAGNOSIS — M9080 Osteopathy in diseases classified elsewhere, unspecified site: Secondary | ICD-10-CM | POA: Diagnosis not present

## 2017-08-25 DIAGNOSIS — E889 Metabolic disorder, unspecified: Secondary | ICD-10-CM | POA: Diagnosis not present

## 2017-08-25 DIAGNOSIS — N2581 Secondary hyperparathyroidism of renal origin: Secondary | ICD-10-CM | POA: Diagnosis not present

## 2017-08-25 DIAGNOSIS — Z992 Dependence on renal dialysis: Secondary | ICD-10-CM | POA: Diagnosis not present

## 2017-08-25 DIAGNOSIS — D509 Iron deficiency anemia, unspecified: Secondary | ICD-10-CM | POA: Diagnosis not present

## 2017-08-25 DIAGNOSIS — D631 Anemia in chronic kidney disease: Secondary | ICD-10-CM | POA: Diagnosis not present

## 2017-08-25 DIAGNOSIS — I10 Essential (primary) hypertension: Secondary | ICD-10-CM | POA: Diagnosis not present

## 2017-08-26 DIAGNOSIS — D631 Anemia in chronic kidney disease: Secondary | ICD-10-CM | POA: Diagnosis not present

## 2017-08-26 DIAGNOSIS — N186 End stage renal disease: Secondary | ICD-10-CM | POA: Diagnosis not present

## 2017-08-26 DIAGNOSIS — Z992 Dependence on renal dialysis: Secondary | ICD-10-CM | POA: Diagnosis not present

## 2017-08-27 DIAGNOSIS — Z992 Dependence on renal dialysis: Secondary | ICD-10-CM | POA: Diagnosis not present

## 2017-08-27 DIAGNOSIS — N186 End stage renal disease: Secondary | ICD-10-CM | POA: Diagnosis not present

## 2017-08-27 DIAGNOSIS — D631 Anemia in chronic kidney disease: Secondary | ICD-10-CM | POA: Diagnosis not present

## 2017-08-28 DIAGNOSIS — N186 End stage renal disease: Secondary | ICD-10-CM | POA: Diagnosis not present

## 2017-08-28 DIAGNOSIS — Z992 Dependence on renal dialysis: Secondary | ICD-10-CM | POA: Diagnosis not present

## 2017-08-28 DIAGNOSIS — D631 Anemia in chronic kidney disease: Secondary | ICD-10-CM | POA: Diagnosis not present

## 2017-08-29 DIAGNOSIS — N186 End stage renal disease: Secondary | ICD-10-CM | POA: Diagnosis not present

## 2017-08-29 DIAGNOSIS — D631 Anemia in chronic kidney disease: Secondary | ICD-10-CM | POA: Diagnosis not present

## 2017-08-29 DIAGNOSIS — Z992 Dependence on renal dialysis: Secondary | ICD-10-CM | POA: Diagnosis not present

## 2017-08-30 DIAGNOSIS — Z992 Dependence on renal dialysis: Secondary | ICD-10-CM | POA: Diagnosis not present

## 2017-08-30 DIAGNOSIS — D631 Anemia in chronic kidney disease: Secondary | ICD-10-CM | POA: Diagnosis not present

## 2017-08-30 DIAGNOSIS — N186 End stage renal disease: Secondary | ICD-10-CM | POA: Diagnosis not present

## 2017-09-01 DIAGNOSIS — Z992 Dependence on renal dialysis: Secondary | ICD-10-CM | POA: Diagnosis not present

## 2017-09-01 DIAGNOSIS — D631 Anemia in chronic kidney disease: Secondary | ICD-10-CM | POA: Diagnosis not present

## 2017-09-01 DIAGNOSIS — Z1159 Encounter for screening for other viral diseases: Secondary | ICD-10-CM | POA: Diagnosis not present

## 2017-09-01 DIAGNOSIS — N186 End stage renal disease: Secondary | ICD-10-CM | POA: Diagnosis not present

## 2017-09-01 DIAGNOSIS — D509 Iron deficiency anemia, unspecified: Secondary | ICD-10-CM | POA: Diagnosis not present

## 2017-09-01 DIAGNOSIS — N2581 Secondary hyperparathyroidism of renal origin: Secondary | ICD-10-CM | POA: Diagnosis not present

## 2017-09-02 DIAGNOSIS — D509 Iron deficiency anemia, unspecified: Secondary | ICD-10-CM | POA: Diagnosis not present

## 2017-09-02 DIAGNOSIS — N186 End stage renal disease: Secondary | ICD-10-CM | POA: Diagnosis not present

## 2017-09-02 DIAGNOSIS — D631 Anemia in chronic kidney disease: Secondary | ICD-10-CM | POA: Diagnosis not present

## 2017-09-02 DIAGNOSIS — Z992 Dependence on renal dialysis: Secondary | ICD-10-CM | POA: Diagnosis not present

## 2017-09-02 DIAGNOSIS — N2581 Secondary hyperparathyroidism of renal origin: Secondary | ICD-10-CM | POA: Diagnosis not present

## 2017-09-03 DIAGNOSIS — Z992 Dependence on renal dialysis: Secondary | ICD-10-CM | POA: Diagnosis not present

## 2017-09-03 DIAGNOSIS — N2581 Secondary hyperparathyroidism of renal origin: Secondary | ICD-10-CM | POA: Diagnosis not present

## 2017-09-03 DIAGNOSIS — N186 End stage renal disease: Secondary | ICD-10-CM | POA: Diagnosis not present

## 2017-09-03 DIAGNOSIS — D509 Iron deficiency anemia, unspecified: Secondary | ICD-10-CM | POA: Diagnosis not present

## 2017-09-03 DIAGNOSIS — D631 Anemia in chronic kidney disease: Secondary | ICD-10-CM | POA: Diagnosis not present

## 2017-09-04 DIAGNOSIS — Z992 Dependence on renal dialysis: Secondary | ICD-10-CM | POA: Diagnosis not present

## 2017-09-04 DIAGNOSIS — N186 End stage renal disease: Secondary | ICD-10-CM | POA: Diagnosis not present

## 2017-09-04 DIAGNOSIS — N2581 Secondary hyperparathyroidism of renal origin: Secondary | ICD-10-CM | POA: Diagnosis not present

## 2017-09-04 DIAGNOSIS — D631 Anemia in chronic kidney disease: Secondary | ICD-10-CM | POA: Diagnosis not present

## 2017-09-04 DIAGNOSIS — D509 Iron deficiency anemia, unspecified: Secondary | ICD-10-CM | POA: Diagnosis not present

## 2017-09-05 DIAGNOSIS — N186 End stage renal disease: Secondary | ICD-10-CM | POA: Diagnosis not present

## 2017-09-05 DIAGNOSIS — D509 Iron deficiency anemia, unspecified: Secondary | ICD-10-CM | POA: Diagnosis not present

## 2017-09-05 DIAGNOSIS — N2581 Secondary hyperparathyroidism of renal origin: Secondary | ICD-10-CM | POA: Diagnosis not present

## 2017-09-05 DIAGNOSIS — Z992 Dependence on renal dialysis: Secondary | ICD-10-CM | POA: Diagnosis not present

## 2017-09-05 DIAGNOSIS — D631 Anemia in chronic kidney disease: Secondary | ICD-10-CM | POA: Diagnosis not present

## 2017-09-06 DIAGNOSIS — D631 Anemia in chronic kidney disease: Secondary | ICD-10-CM | POA: Diagnosis not present

## 2017-09-06 DIAGNOSIS — N2581 Secondary hyperparathyroidism of renal origin: Secondary | ICD-10-CM | POA: Diagnosis not present

## 2017-09-06 DIAGNOSIS — N186 End stage renal disease: Secondary | ICD-10-CM | POA: Diagnosis not present

## 2017-09-06 DIAGNOSIS — Z992 Dependence on renal dialysis: Secondary | ICD-10-CM | POA: Diagnosis not present

## 2017-09-06 DIAGNOSIS — D509 Iron deficiency anemia, unspecified: Secondary | ICD-10-CM | POA: Diagnosis not present

## 2017-09-07 DIAGNOSIS — D631 Anemia in chronic kidney disease: Secondary | ICD-10-CM | POA: Diagnosis not present

## 2017-09-07 DIAGNOSIS — Z992 Dependence on renal dialysis: Secondary | ICD-10-CM | POA: Diagnosis not present

## 2017-09-07 DIAGNOSIS — N186 End stage renal disease: Secondary | ICD-10-CM | POA: Diagnosis not present

## 2017-09-07 DIAGNOSIS — N2581 Secondary hyperparathyroidism of renal origin: Secondary | ICD-10-CM | POA: Diagnosis not present

## 2017-09-07 DIAGNOSIS — D509 Iron deficiency anemia, unspecified: Secondary | ICD-10-CM | POA: Diagnosis not present

## 2017-09-08 DIAGNOSIS — D631 Anemia in chronic kidney disease: Secondary | ICD-10-CM | POA: Diagnosis not present

## 2017-09-08 DIAGNOSIS — N186 End stage renal disease: Secondary | ICD-10-CM | POA: Diagnosis not present

## 2017-09-08 DIAGNOSIS — N2581 Secondary hyperparathyroidism of renal origin: Secondary | ICD-10-CM | POA: Diagnosis not present

## 2017-09-08 DIAGNOSIS — Z992 Dependence on renal dialysis: Secondary | ICD-10-CM | POA: Diagnosis not present

## 2017-09-08 DIAGNOSIS — D509 Iron deficiency anemia, unspecified: Secondary | ICD-10-CM | POA: Diagnosis not present

## 2017-09-09 DIAGNOSIS — D509 Iron deficiency anemia, unspecified: Secondary | ICD-10-CM | POA: Diagnosis not present

## 2017-09-09 DIAGNOSIS — N186 End stage renal disease: Secondary | ICD-10-CM | POA: Diagnosis not present

## 2017-09-09 DIAGNOSIS — Z992 Dependence on renal dialysis: Secondary | ICD-10-CM | POA: Diagnosis not present

## 2017-09-09 DIAGNOSIS — D631 Anemia in chronic kidney disease: Secondary | ICD-10-CM | POA: Diagnosis not present

## 2017-09-09 DIAGNOSIS — N2581 Secondary hyperparathyroidism of renal origin: Secondary | ICD-10-CM | POA: Diagnosis not present

## 2017-09-10 DIAGNOSIS — D509 Iron deficiency anemia, unspecified: Secondary | ICD-10-CM | POA: Diagnosis not present

## 2017-09-10 DIAGNOSIS — Z992 Dependence on renal dialysis: Secondary | ICD-10-CM | POA: Diagnosis not present

## 2017-09-10 DIAGNOSIS — D631 Anemia in chronic kidney disease: Secondary | ICD-10-CM | POA: Diagnosis not present

## 2017-09-10 DIAGNOSIS — N2581 Secondary hyperparathyroidism of renal origin: Secondary | ICD-10-CM | POA: Diagnosis not present

## 2017-09-10 DIAGNOSIS — N186 End stage renal disease: Secondary | ICD-10-CM | POA: Diagnosis not present

## 2017-09-11 DIAGNOSIS — Z992 Dependence on renal dialysis: Secondary | ICD-10-CM | POA: Diagnosis not present

## 2017-09-11 DIAGNOSIS — D509 Iron deficiency anemia, unspecified: Secondary | ICD-10-CM | POA: Diagnosis not present

## 2017-09-11 DIAGNOSIS — N186 End stage renal disease: Secondary | ICD-10-CM | POA: Diagnosis not present

## 2017-09-11 DIAGNOSIS — N2581 Secondary hyperparathyroidism of renal origin: Secondary | ICD-10-CM | POA: Diagnosis not present

## 2017-09-11 DIAGNOSIS — D631 Anemia in chronic kidney disease: Secondary | ICD-10-CM | POA: Diagnosis not present

## 2017-09-12 DIAGNOSIS — N186 End stage renal disease: Secondary | ICD-10-CM | POA: Diagnosis not present

## 2017-09-12 DIAGNOSIS — Z992 Dependence on renal dialysis: Secondary | ICD-10-CM | POA: Diagnosis not present

## 2017-09-12 DIAGNOSIS — D509 Iron deficiency anemia, unspecified: Secondary | ICD-10-CM | POA: Diagnosis not present

## 2017-09-12 DIAGNOSIS — D631 Anemia in chronic kidney disease: Secondary | ICD-10-CM | POA: Diagnosis not present

## 2017-09-12 DIAGNOSIS — N2581 Secondary hyperparathyroidism of renal origin: Secondary | ICD-10-CM | POA: Diagnosis not present

## 2017-09-13 DIAGNOSIS — D509 Iron deficiency anemia, unspecified: Secondary | ICD-10-CM | POA: Diagnosis not present

## 2017-09-13 DIAGNOSIS — D631 Anemia in chronic kidney disease: Secondary | ICD-10-CM | POA: Diagnosis not present

## 2017-09-13 DIAGNOSIS — Z992 Dependence on renal dialysis: Secondary | ICD-10-CM | POA: Diagnosis not present

## 2017-09-13 DIAGNOSIS — N186 End stage renal disease: Secondary | ICD-10-CM | POA: Diagnosis not present

## 2017-09-13 DIAGNOSIS — N2581 Secondary hyperparathyroidism of renal origin: Secondary | ICD-10-CM | POA: Diagnosis not present

## 2017-09-14 DIAGNOSIS — D631 Anemia in chronic kidney disease: Secondary | ICD-10-CM | POA: Diagnosis not present

## 2017-09-14 DIAGNOSIS — Z992 Dependence on renal dialysis: Secondary | ICD-10-CM | POA: Diagnosis not present

## 2017-09-14 DIAGNOSIS — D509 Iron deficiency anemia, unspecified: Secondary | ICD-10-CM | POA: Diagnosis not present

## 2017-09-14 DIAGNOSIS — N186 End stage renal disease: Secondary | ICD-10-CM | POA: Diagnosis not present

## 2017-09-14 DIAGNOSIS — N2581 Secondary hyperparathyroidism of renal origin: Secondary | ICD-10-CM | POA: Diagnosis not present

## 2017-09-15 DIAGNOSIS — I953 Hypotension of hemodialysis: Secondary | ICD-10-CM | POA: Diagnosis not present

## 2017-09-15 DIAGNOSIS — Z992 Dependence on renal dialysis: Secondary | ICD-10-CM | POA: Diagnosis not present

## 2017-09-15 DIAGNOSIS — N2581 Secondary hyperparathyroidism of renal origin: Secondary | ICD-10-CM | POA: Diagnosis not present

## 2017-09-15 DIAGNOSIS — N186 End stage renal disease: Secondary | ICD-10-CM | POA: Diagnosis not present

## 2017-09-15 DIAGNOSIS — D509 Iron deficiency anemia, unspecified: Secondary | ICD-10-CM | POA: Diagnosis not present

## 2017-09-15 DIAGNOSIS — D631 Anemia in chronic kidney disease: Secondary | ICD-10-CM | POA: Diagnosis not present

## 2017-09-15 DIAGNOSIS — E113413 Type 2 diabetes mellitus with severe nonproliferative diabetic retinopathy with macular edema, bilateral: Secondary | ICD-10-CM | POA: Diagnosis not present

## 2017-09-16 DIAGNOSIS — N186 End stage renal disease: Secondary | ICD-10-CM | POA: Diagnosis not present

## 2017-09-16 DIAGNOSIS — N2581 Secondary hyperparathyroidism of renal origin: Secondary | ICD-10-CM | POA: Diagnosis not present

## 2017-09-16 DIAGNOSIS — Z992 Dependence on renal dialysis: Secondary | ICD-10-CM | POA: Diagnosis not present

## 2017-09-16 DIAGNOSIS — D509 Iron deficiency anemia, unspecified: Secondary | ICD-10-CM | POA: Diagnosis not present

## 2017-09-16 DIAGNOSIS — D631 Anemia in chronic kidney disease: Secondary | ICD-10-CM | POA: Diagnosis not present

## 2017-09-17 DIAGNOSIS — Z992 Dependence on renal dialysis: Secondary | ICD-10-CM | POA: Diagnosis not present

## 2017-09-17 DIAGNOSIS — D631 Anemia in chronic kidney disease: Secondary | ICD-10-CM | POA: Diagnosis not present

## 2017-09-17 DIAGNOSIS — N186 End stage renal disease: Secondary | ICD-10-CM | POA: Diagnosis not present

## 2017-09-17 DIAGNOSIS — N2581 Secondary hyperparathyroidism of renal origin: Secondary | ICD-10-CM | POA: Diagnosis not present

## 2017-09-17 DIAGNOSIS — D509 Iron deficiency anemia, unspecified: Secondary | ICD-10-CM | POA: Diagnosis not present

## 2017-09-18 DIAGNOSIS — D631 Anemia in chronic kidney disease: Secondary | ICD-10-CM | POA: Diagnosis not present

## 2017-09-18 DIAGNOSIS — D509 Iron deficiency anemia, unspecified: Secondary | ICD-10-CM | POA: Diagnosis not present

## 2017-09-18 DIAGNOSIS — N2581 Secondary hyperparathyroidism of renal origin: Secondary | ICD-10-CM | POA: Diagnosis not present

## 2017-09-18 DIAGNOSIS — N186 End stage renal disease: Secondary | ICD-10-CM | POA: Diagnosis not present

## 2017-09-18 DIAGNOSIS — Z992 Dependence on renal dialysis: Secondary | ICD-10-CM | POA: Diagnosis not present

## 2017-09-19 ENCOUNTER — Encounter: Payer: Self-pay | Admitting: Gastroenterology

## 2017-09-19 DIAGNOSIS — D631 Anemia in chronic kidney disease: Secondary | ICD-10-CM | POA: Diagnosis not present

## 2017-09-19 DIAGNOSIS — N2581 Secondary hyperparathyroidism of renal origin: Secondary | ICD-10-CM | POA: Diagnosis not present

## 2017-09-19 DIAGNOSIS — Z992 Dependence on renal dialysis: Secondary | ICD-10-CM | POA: Diagnosis not present

## 2017-09-19 DIAGNOSIS — N186 End stage renal disease: Secondary | ICD-10-CM | POA: Diagnosis not present

## 2017-09-19 DIAGNOSIS — D509 Iron deficiency anemia, unspecified: Secondary | ICD-10-CM | POA: Diagnosis not present

## 2017-09-20 ENCOUNTER — Encounter (HOSPITAL_BASED_OUTPATIENT_CLINIC_OR_DEPARTMENT_OTHER): Payer: Self-pay

## 2017-09-20 DIAGNOSIS — R5383 Other fatigue: Secondary | ICD-10-CM

## 2017-09-20 DIAGNOSIS — N186 End stage renal disease: Secondary | ICD-10-CM | POA: Diagnosis not present

## 2017-09-20 DIAGNOSIS — N2581 Secondary hyperparathyroidism of renal origin: Secondary | ICD-10-CM | POA: Diagnosis not present

## 2017-09-20 DIAGNOSIS — D631 Anemia in chronic kidney disease: Secondary | ICD-10-CM | POA: Diagnosis not present

## 2017-09-20 DIAGNOSIS — R0683 Snoring: Secondary | ICD-10-CM

## 2017-09-20 DIAGNOSIS — Z992 Dependence on renal dialysis: Secondary | ICD-10-CM | POA: Diagnosis not present

## 2017-09-20 DIAGNOSIS — D509 Iron deficiency anemia, unspecified: Secondary | ICD-10-CM | POA: Diagnosis not present

## 2017-09-21 DIAGNOSIS — D631 Anemia in chronic kidney disease: Secondary | ICD-10-CM | POA: Diagnosis not present

## 2017-09-21 DIAGNOSIS — D509 Iron deficiency anemia, unspecified: Secondary | ICD-10-CM | POA: Diagnosis not present

## 2017-09-21 DIAGNOSIS — N186 End stage renal disease: Secondary | ICD-10-CM | POA: Diagnosis not present

## 2017-09-21 DIAGNOSIS — N2581 Secondary hyperparathyroidism of renal origin: Secondary | ICD-10-CM | POA: Diagnosis not present

## 2017-09-21 DIAGNOSIS — Z992 Dependence on renal dialysis: Secondary | ICD-10-CM | POA: Diagnosis not present

## 2017-09-22 DIAGNOSIS — N186 End stage renal disease: Secondary | ICD-10-CM | POA: Diagnosis not present

## 2017-09-22 DIAGNOSIS — N2581 Secondary hyperparathyroidism of renal origin: Secondary | ICD-10-CM | POA: Diagnosis not present

## 2017-09-22 DIAGNOSIS — Z992 Dependence on renal dialysis: Secondary | ICD-10-CM | POA: Diagnosis not present

## 2017-09-22 DIAGNOSIS — D631 Anemia in chronic kidney disease: Secondary | ICD-10-CM | POA: Diagnosis not present

## 2017-09-22 DIAGNOSIS — D509 Iron deficiency anemia, unspecified: Secondary | ICD-10-CM | POA: Diagnosis not present

## 2017-09-23 DIAGNOSIS — D509 Iron deficiency anemia, unspecified: Secondary | ICD-10-CM | POA: Diagnosis not present

## 2017-09-23 DIAGNOSIS — Z992 Dependence on renal dialysis: Secondary | ICD-10-CM | POA: Diagnosis not present

## 2017-09-23 DIAGNOSIS — N2581 Secondary hyperparathyroidism of renal origin: Secondary | ICD-10-CM | POA: Diagnosis not present

## 2017-09-23 DIAGNOSIS — N186 End stage renal disease: Secondary | ICD-10-CM | POA: Diagnosis not present

## 2017-09-23 DIAGNOSIS — D631 Anemia in chronic kidney disease: Secondary | ICD-10-CM | POA: Diagnosis not present

## 2017-09-24 DIAGNOSIS — N186 End stage renal disease: Secondary | ICD-10-CM | POA: Diagnosis not present

## 2017-09-24 DIAGNOSIS — D509 Iron deficiency anemia, unspecified: Secondary | ICD-10-CM | POA: Diagnosis not present

## 2017-09-24 DIAGNOSIS — D631 Anemia in chronic kidney disease: Secondary | ICD-10-CM | POA: Diagnosis not present

## 2017-09-24 DIAGNOSIS — Z992 Dependence on renal dialysis: Secondary | ICD-10-CM | POA: Diagnosis not present

## 2017-09-24 DIAGNOSIS — N2581 Secondary hyperparathyroidism of renal origin: Secondary | ICD-10-CM | POA: Diagnosis not present

## 2017-09-25 DIAGNOSIS — Z992 Dependence on renal dialysis: Secondary | ICD-10-CM | POA: Diagnosis not present

## 2017-09-25 DIAGNOSIS — N2581 Secondary hyperparathyroidism of renal origin: Secondary | ICD-10-CM | POA: Diagnosis not present

## 2017-09-25 DIAGNOSIS — D631 Anemia in chronic kidney disease: Secondary | ICD-10-CM | POA: Diagnosis not present

## 2017-09-25 DIAGNOSIS — D509 Iron deficiency anemia, unspecified: Secondary | ICD-10-CM | POA: Diagnosis not present

## 2017-09-25 DIAGNOSIS — N186 End stage renal disease: Secondary | ICD-10-CM | POA: Diagnosis not present

## 2017-09-26 DIAGNOSIS — N2581 Secondary hyperparathyroidism of renal origin: Secondary | ICD-10-CM | POA: Diagnosis not present

## 2017-09-26 DIAGNOSIS — D509 Iron deficiency anemia, unspecified: Secondary | ICD-10-CM | POA: Diagnosis not present

## 2017-09-26 DIAGNOSIS — Z992 Dependence on renal dialysis: Secondary | ICD-10-CM | POA: Diagnosis not present

## 2017-09-26 DIAGNOSIS — N186 End stage renal disease: Secondary | ICD-10-CM | POA: Diagnosis not present

## 2017-09-26 DIAGNOSIS — D631 Anemia in chronic kidney disease: Secondary | ICD-10-CM | POA: Diagnosis not present

## 2017-09-27 DIAGNOSIS — Z992 Dependence on renal dialysis: Secondary | ICD-10-CM | POA: Diagnosis not present

## 2017-09-27 DIAGNOSIS — N2581 Secondary hyperparathyroidism of renal origin: Secondary | ICD-10-CM | POA: Diagnosis not present

## 2017-09-27 DIAGNOSIS — D631 Anemia in chronic kidney disease: Secondary | ICD-10-CM | POA: Diagnosis not present

## 2017-09-27 DIAGNOSIS — N186 End stage renal disease: Secondary | ICD-10-CM | POA: Diagnosis not present

## 2017-09-27 DIAGNOSIS — D509 Iron deficiency anemia, unspecified: Secondary | ICD-10-CM | POA: Diagnosis not present

## 2017-09-28 DIAGNOSIS — N2581 Secondary hyperparathyroidism of renal origin: Secondary | ICD-10-CM | POA: Diagnosis not present

## 2017-09-28 DIAGNOSIS — D631 Anemia in chronic kidney disease: Secondary | ICD-10-CM | POA: Diagnosis not present

## 2017-09-28 DIAGNOSIS — Z992 Dependence on renal dialysis: Secondary | ICD-10-CM | POA: Diagnosis not present

## 2017-09-28 DIAGNOSIS — N186 End stage renal disease: Secondary | ICD-10-CM | POA: Diagnosis not present

## 2017-09-28 DIAGNOSIS — D509 Iron deficiency anemia, unspecified: Secondary | ICD-10-CM | POA: Diagnosis not present

## 2017-09-29 DIAGNOSIS — D631 Anemia in chronic kidney disease: Secondary | ICD-10-CM | POA: Diagnosis not present

## 2017-09-29 DIAGNOSIS — N186 End stage renal disease: Secondary | ICD-10-CM | POA: Diagnosis not present

## 2017-09-29 DIAGNOSIS — Z992 Dependence on renal dialysis: Secondary | ICD-10-CM | POA: Diagnosis not present

## 2017-09-29 DIAGNOSIS — D509 Iron deficiency anemia, unspecified: Secondary | ICD-10-CM | POA: Diagnosis not present

## 2017-09-29 DIAGNOSIS — N2581 Secondary hyperparathyroidism of renal origin: Secondary | ICD-10-CM | POA: Diagnosis not present

## 2017-09-30 DIAGNOSIS — N186 End stage renal disease: Secondary | ICD-10-CM | POA: Diagnosis not present

## 2017-09-30 DIAGNOSIS — D509 Iron deficiency anemia, unspecified: Secondary | ICD-10-CM | POA: Diagnosis not present

## 2017-09-30 DIAGNOSIS — N2581 Secondary hyperparathyroidism of renal origin: Secondary | ICD-10-CM | POA: Diagnosis not present

## 2017-09-30 DIAGNOSIS — D631 Anemia in chronic kidney disease: Secondary | ICD-10-CM | POA: Diagnosis not present

## 2017-09-30 DIAGNOSIS — Z992 Dependence on renal dialysis: Secondary | ICD-10-CM | POA: Diagnosis not present

## 2017-10-01 DIAGNOSIS — Z992 Dependence on renal dialysis: Secondary | ICD-10-CM | POA: Diagnosis not present

## 2017-10-01 DIAGNOSIS — N186 End stage renal disease: Secondary | ICD-10-CM | POA: Diagnosis not present

## 2017-10-01 DIAGNOSIS — D509 Iron deficiency anemia, unspecified: Secondary | ICD-10-CM | POA: Diagnosis not present

## 2017-10-01 DIAGNOSIS — N2581 Secondary hyperparathyroidism of renal origin: Secondary | ICD-10-CM | POA: Diagnosis not present

## 2017-10-01 DIAGNOSIS — D631 Anemia in chronic kidney disease: Secondary | ICD-10-CM | POA: Diagnosis not present

## 2017-10-02 DIAGNOSIS — N2581 Secondary hyperparathyroidism of renal origin: Secondary | ICD-10-CM | POA: Diagnosis not present

## 2017-10-02 DIAGNOSIS — D631 Anemia in chronic kidney disease: Secondary | ICD-10-CM | POA: Diagnosis not present

## 2017-10-02 DIAGNOSIS — Z79899 Other long term (current) drug therapy: Secondary | ICD-10-CM | POA: Diagnosis not present

## 2017-10-02 DIAGNOSIS — Z794 Long term (current) use of insulin: Secondary | ICD-10-CM | POA: Diagnosis not present

## 2017-10-02 DIAGNOSIS — N186 End stage renal disease: Secondary | ICD-10-CM | POA: Diagnosis not present

## 2017-10-02 DIAGNOSIS — E119 Type 2 diabetes mellitus without complications: Secondary | ICD-10-CM | POA: Diagnosis not present

## 2017-10-02 DIAGNOSIS — Z992 Dependence on renal dialysis: Secondary | ICD-10-CM | POA: Diagnosis not present

## 2017-10-02 DIAGNOSIS — D509 Iron deficiency anemia, unspecified: Secondary | ICD-10-CM | POA: Diagnosis not present

## 2017-10-03 DIAGNOSIS — Z992 Dependence on renal dialysis: Secondary | ICD-10-CM | POA: Diagnosis not present

## 2017-10-03 DIAGNOSIS — N186 End stage renal disease: Secondary | ICD-10-CM | POA: Diagnosis not present

## 2017-10-03 DIAGNOSIS — N2581 Secondary hyperparathyroidism of renal origin: Secondary | ICD-10-CM | POA: Diagnosis not present

## 2017-10-03 DIAGNOSIS — D631 Anemia in chronic kidney disease: Secondary | ICD-10-CM | POA: Diagnosis not present

## 2017-10-03 DIAGNOSIS — D509 Iron deficiency anemia, unspecified: Secondary | ICD-10-CM | POA: Diagnosis not present

## 2017-10-04 DIAGNOSIS — Z992 Dependence on renal dialysis: Secondary | ICD-10-CM | POA: Diagnosis not present

## 2017-10-04 DIAGNOSIS — D631 Anemia in chronic kidney disease: Secondary | ICD-10-CM | POA: Diagnosis not present

## 2017-10-04 DIAGNOSIS — D509 Iron deficiency anemia, unspecified: Secondary | ICD-10-CM | POA: Diagnosis not present

## 2017-10-04 DIAGNOSIS — N2581 Secondary hyperparathyroidism of renal origin: Secondary | ICD-10-CM | POA: Diagnosis not present

## 2017-10-04 DIAGNOSIS — N186 End stage renal disease: Secondary | ICD-10-CM | POA: Diagnosis not present

## 2017-10-05 DIAGNOSIS — Z992 Dependence on renal dialysis: Secondary | ICD-10-CM | POA: Diagnosis not present

## 2017-10-05 DIAGNOSIS — N2581 Secondary hyperparathyroidism of renal origin: Secondary | ICD-10-CM | POA: Diagnosis not present

## 2017-10-05 DIAGNOSIS — D631 Anemia in chronic kidney disease: Secondary | ICD-10-CM | POA: Diagnosis not present

## 2017-10-05 DIAGNOSIS — N186 End stage renal disease: Secondary | ICD-10-CM | POA: Diagnosis not present

## 2017-10-05 DIAGNOSIS — D509 Iron deficiency anemia, unspecified: Secondary | ICD-10-CM | POA: Diagnosis not present

## 2017-10-06 DIAGNOSIS — D631 Anemia in chronic kidney disease: Secondary | ICD-10-CM | POA: Diagnosis not present

## 2017-10-06 DIAGNOSIS — Z992 Dependence on renal dialysis: Secondary | ICD-10-CM | POA: Diagnosis not present

## 2017-10-06 DIAGNOSIS — N2581 Secondary hyperparathyroidism of renal origin: Secondary | ICD-10-CM | POA: Diagnosis not present

## 2017-10-06 DIAGNOSIS — D509 Iron deficiency anemia, unspecified: Secondary | ICD-10-CM | POA: Diagnosis not present

## 2017-10-06 DIAGNOSIS — N186 End stage renal disease: Secondary | ICD-10-CM | POA: Diagnosis not present

## 2017-10-07 DIAGNOSIS — N2581 Secondary hyperparathyroidism of renal origin: Secondary | ICD-10-CM | POA: Diagnosis not present

## 2017-10-07 DIAGNOSIS — D631 Anemia in chronic kidney disease: Secondary | ICD-10-CM | POA: Diagnosis not present

## 2017-10-07 DIAGNOSIS — N186 End stage renal disease: Secondary | ICD-10-CM | POA: Diagnosis not present

## 2017-10-07 DIAGNOSIS — Z992 Dependence on renal dialysis: Secondary | ICD-10-CM | POA: Diagnosis not present

## 2017-10-07 DIAGNOSIS — D509 Iron deficiency anemia, unspecified: Secondary | ICD-10-CM | POA: Diagnosis not present

## 2017-10-08 DIAGNOSIS — N186 End stage renal disease: Secondary | ICD-10-CM | POA: Diagnosis not present

## 2017-10-08 DIAGNOSIS — N2581 Secondary hyperparathyroidism of renal origin: Secondary | ICD-10-CM | POA: Diagnosis not present

## 2017-10-08 DIAGNOSIS — D631 Anemia in chronic kidney disease: Secondary | ICD-10-CM | POA: Diagnosis not present

## 2017-10-08 DIAGNOSIS — D509 Iron deficiency anemia, unspecified: Secondary | ICD-10-CM | POA: Diagnosis not present

## 2017-10-08 DIAGNOSIS — Z992 Dependence on renal dialysis: Secondary | ICD-10-CM | POA: Diagnosis not present

## 2017-10-09 DIAGNOSIS — D509 Iron deficiency anemia, unspecified: Secondary | ICD-10-CM | POA: Diagnosis not present

## 2017-10-09 DIAGNOSIS — N2581 Secondary hyperparathyroidism of renal origin: Secondary | ICD-10-CM | POA: Diagnosis not present

## 2017-10-09 DIAGNOSIS — N186 End stage renal disease: Secondary | ICD-10-CM | POA: Diagnosis not present

## 2017-10-09 DIAGNOSIS — D631 Anemia in chronic kidney disease: Secondary | ICD-10-CM | POA: Diagnosis not present

## 2017-10-09 DIAGNOSIS — Z992 Dependence on renal dialysis: Secondary | ICD-10-CM | POA: Diagnosis not present

## 2017-10-10 DIAGNOSIS — N186 End stage renal disease: Secondary | ICD-10-CM | POA: Diagnosis not present

## 2017-10-10 DIAGNOSIS — N2581 Secondary hyperparathyroidism of renal origin: Secondary | ICD-10-CM | POA: Diagnosis not present

## 2017-10-10 DIAGNOSIS — D631 Anemia in chronic kidney disease: Secondary | ICD-10-CM | POA: Diagnosis not present

## 2017-10-10 DIAGNOSIS — Z992 Dependence on renal dialysis: Secondary | ICD-10-CM | POA: Diagnosis not present

## 2017-10-10 DIAGNOSIS — D509 Iron deficiency anemia, unspecified: Secondary | ICD-10-CM | POA: Diagnosis not present

## 2017-10-11 DIAGNOSIS — D631 Anemia in chronic kidney disease: Secondary | ICD-10-CM | POA: Diagnosis not present

## 2017-10-11 DIAGNOSIS — N186 End stage renal disease: Secondary | ICD-10-CM | POA: Diagnosis not present

## 2017-10-11 DIAGNOSIS — Z992 Dependence on renal dialysis: Secondary | ICD-10-CM | POA: Diagnosis not present

## 2017-10-11 DIAGNOSIS — D509 Iron deficiency anemia, unspecified: Secondary | ICD-10-CM | POA: Diagnosis not present

## 2017-10-11 DIAGNOSIS — N2581 Secondary hyperparathyroidism of renal origin: Secondary | ICD-10-CM | POA: Diagnosis not present

## 2017-10-12 DIAGNOSIS — D631 Anemia in chronic kidney disease: Secondary | ICD-10-CM | POA: Diagnosis not present

## 2017-10-12 DIAGNOSIS — N2581 Secondary hyperparathyroidism of renal origin: Secondary | ICD-10-CM | POA: Diagnosis not present

## 2017-10-12 DIAGNOSIS — D509 Iron deficiency anemia, unspecified: Secondary | ICD-10-CM | POA: Diagnosis not present

## 2017-10-12 DIAGNOSIS — Z992 Dependence on renal dialysis: Secondary | ICD-10-CM | POA: Diagnosis not present

## 2017-10-12 DIAGNOSIS — N186 End stage renal disease: Secondary | ICD-10-CM | POA: Diagnosis not present

## 2017-10-13 DIAGNOSIS — Z992 Dependence on renal dialysis: Secondary | ICD-10-CM | POA: Diagnosis not present

## 2017-10-13 DIAGNOSIS — D631 Anemia in chronic kidney disease: Secondary | ICD-10-CM | POA: Diagnosis not present

## 2017-10-13 DIAGNOSIS — N186 End stage renal disease: Secondary | ICD-10-CM | POA: Diagnosis not present

## 2017-10-13 DIAGNOSIS — N2581 Secondary hyperparathyroidism of renal origin: Secondary | ICD-10-CM | POA: Diagnosis not present

## 2017-10-13 DIAGNOSIS — D509 Iron deficiency anemia, unspecified: Secondary | ICD-10-CM | POA: Diagnosis not present

## 2017-10-14 DIAGNOSIS — N2581 Secondary hyperparathyroidism of renal origin: Secondary | ICD-10-CM | POA: Diagnosis not present

## 2017-10-14 DIAGNOSIS — N186 End stage renal disease: Secondary | ICD-10-CM | POA: Diagnosis not present

## 2017-10-14 DIAGNOSIS — Z992 Dependence on renal dialysis: Secondary | ICD-10-CM | POA: Diagnosis not present

## 2017-10-14 DIAGNOSIS — D631 Anemia in chronic kidney disease: Secondary | ICD-10-CM | POA: Diagnosis not present

## 2017-10-14 DIAGNOSIS — D509 Iron deficiency anemia, unspecified: Secondary | ICD-10-CM | POA: Diagnosis not present

## 2017-10-15 DIAGNOSIS — N186 End stage renal disease: Secondary | ICD-10-CM | POA: Diagnosis not present

## 2017-10-15 DIAGNOSIS — D509 Iron deficiency anemia, unspecified: Secondary | ICD-10-CM | POA: Diagnosis not present

## 2017-10-15 DIAGNOSIS — D631 Anemia in chronic kidney disease: Secondary | ICD-10-CM | POA: Diagnosis not present

## 2017-10-15 DIAGNOSIS — Z992 Dependence on renal dialysis: Secondary | ICD-10-CM | POA: Diagnosis not present

## 2017-10-15 DIAGNOSIS — N2581 Secondary hyperparathyroidism of renal origin: Secondary | ICD-10-CM | POA: Diagnosis not present

## 2017-10-16 DIAGNOSIS — N2581 Secondary hyperparathyroidism of renal origin: Secondary | ICD-10-CM | POA: Diagnosis not present

## 2017-10-16 DIAGNOSIS — N186 End stage renal disease: Secondary | ICD-10-CM | POA: Diagnosis not present

## 2017-10-16 DIAGNOSIS — D509 Iron deficiency anemia, unspecified: Secondary | ICD-10-CM | POA: Diagnosis not present

## 2017-10-16 DIAGNOSIS — D631 Anemia in chronic kidney disease: Secondary | ICD-10-CM | POA: Diagnosis not present

## 2017-10-16 DIAGNOSIS — Z992 Dependence on renal dialysis: Secondary | ICD-10-CM | POA: Diagnosis not present

## 2017-10-17 DIAGNOSIS — Z992 Dependence on renal dialysis: Secondary | ICD-10-CM | POA: Diagnosis not present

## 2017-10-17 DIAGNOSIS — N186 End stage renal disease: Secondary | ICD-10-CM | POA: Diagnosis not present

## 2017-10-17 DIAGNOSIS — D631 Anemia in chronic kidney disease: Secondary | ICD-10-CM | POA: Diagnosis not present

## 2017-10-17 DIAGNOSIS — D509 Iron deficiency anemia, unspecified: Secondary | ICD-10-CM | POA: Diagnosis not present

## 2017-10-17 DIAGNOSIS — N2581 Secondary hyperparathyroidism of renal origin: Secondary | ICD-10-CM | POA: Diagnosis not present

## 2017-10-18 DIAGNOSIS — Z992 Dependence on renal dialysis: Secondary | ICD-10-CM | POA: Diagnosis not present

## 2017-10-18 DIAGNOSIS — N186 End stage renal disease: Secondary | ICD-10-CM | POA: Diagnosis not present

## 2017-10-18 DIAGNOSIS — D631 Anemia in chronic kidney disease: Secondary | ICD-10-CM | POA: Diagnosis not present

## 2017-10-18 DIAGNOSIS — N2581 Secondary hyperparathyroidism of renal origin: Secondary | ICD-10-CM | POA: Diagnosis not present

## 2017-10-18 DIAGNOSIS — D509 Iron deficiency anemia, unspecified: Secondary | ICD-10-CM | POA: Diagnosis not present

## 2017-10-19 DIAGNOSIS — N2581 Secondary hyperparathyroidism of renal origin: Secondary | ICD-10-CM | POA: Diagnosis not present

## 2017-10-19 DIAGNOSIS — D631 Anemia in chronic kidney disease: Secondary | ICD-10-CM | POA: Diagnosis not present

## 2017-10-19 DIAGNOSIS — D509 Iron deficiency anemia, unspecified: Secondary | ICD-10-CM | POA: Diagnosis not present

## 2017-10-19 DIAGNOSIS — N186 End stage renal disease: Secondary | ICD-10-CM | POA: Diagnosis not present

## 2017-10-19 DIAGNOSIS — Z992 Dependence on renal dialysis: Secondary | ICD-10-CM | POA: Diagnosis not present

## 2017-10-20 DIAGNOSIS — N2581 Secondary hyperparathyroidism of renal origin: Secondary | ICD-10-CM | POA: Diagnosis not present

## 2017-10-20 DIAGNOSIS — D509 Iron deficiency anemia, unspecified: Secondary | ICD-10-CM | POA: Diagnosis not present

## 2017-10-20 DIAGNOSIS — D631 Anemia in chronic kidney disease: Secondary | ICD-10-CM | POA: Diagnosis not present

## 2017-10-20 DIAGNOSIS — N186 End stage renal disease: Secondary | ICD-10-CM | POA: Diagnosis not present

## 2017-10-20 DIAGNOSIS — Z992 Dependence on renal dialysis: Secondary | ICD-10-CM | POA: Diagnosis not present

## 2017-10-21 DIAGNOSIS — N186 End stage renal disease: Secondary | ICD-10-CM | POA: Diagnosis not present

## 2017-10-21 DIAGNOSIS — Z992 Dependence on renal dialysis: Secondary | ICD-10-CM | POA: Diagnosis not present

## 2017-10-21 DIAGNOSIS — N2581 Secondary hyperparathyroidism of renal origin: Secondary | ICD-10-CM | POA: Diagnosis not present

## 2017-10-21 DIAGNOSIS — D631 Anemia in chronic kidney disease: Secondary | ICD-10-CM | POA: Diagnosis not present

## 2017-10-21 DIAGNOSIS — D509 Iron deficiency anemia, unspecified: Secondary | ICD-10-CM | POA: Diagnosis not present

## 2017-10-22 DIAGNOSIS — Z992 Dependence on renal dialysis: Secondary | ICD-10-CM | POA: Diagnosis not present

## 2017-10-22 DIAGNOSIS — D631 Anemia in chronic kidney disease: Secondary | ICD-10-CM | POA: Diagnosis not present

## 2017-10-22 DIAGNOSIS — N186 End stage renal disease: Secondary | ICD-10-CM | POA: Diagnosis not present

## 2017-10-22 DIAGNOSIS — N2581 Secondary hyperparathyroidism of renal origin: Secondary | ICD-10-CM | POA: Diagnosis not present

## 2017-10-22 DIAGNOSIS — D509 Iron deficiency anemia, unspecified: Secondary | ICD-10-CM | POA: Diagnosis not present

## 2017-10-23 ENCOUNTER — Ambulatory Visit (INDEPENDENT_AMBULATORY_CARE_PROVIDER_SITE_OTHER): Payer: Self-pay

## 2017-10-23 DIAGNOSIS — D631 Anemia in chronic kidney disease: Secondary | ICD-10-CM | POA: Diagnosis not present

## 2017-10-23 DIAGNOSIS — D509 Iron deficiency anemia, unspecified: Secondary | ICD-10-CM | POA: Diagnosis not present

## 2017-10-23 DIAGNOSIS — N186 End stage renal disease: Secondary | ICD-10-CM | POA: Diagnosis not present

## 2017-10-23 DIAGNOSIS — Z1211 Encounter for screening for malignant neoplasm of colon: Secondary | ICD-10-CM

## 2017-10-23 DIAGNOSIS — N2581 Secondary hyperparathyroidism of renal origin: Secondary | ICD-10-CM | POA: Diagnosis not present

## 2017-10-23 DIAGNOSIS — Z992 Dependence on renal dialysis: Secondary | ICD-10-CM | POA: Diagnosis not present

## 2017-10-23 NOTE — Progress Notes (Signed)
Gastroenterology Pre-Procedure Review  Request Date:10/23/17 Requesting Physician: Dr.Bland- (no previous tcs)  PATIENT REVIEW QUESTIONS: The patient responded to the following health history questions as indicated:    1. Diabetes Melitis: yes (toujeo 14u qhs, pt is on dialysis at home for 9 hours every night) 2. Joint replacements in the past 12 months: no 3. Major health problems in the past 3 months: no 4. Has an artificial valve or MVP: no 5. Has a defibrillator: no 6. Has been advised in past to take antibiotics in advance of a procedure like teeth cleaning: no 7. Family history of colon cancer: no  8. Alcohol Use: no 9. History of sleep apnea: no  10. History of coronary artery or other vascular stents placed within the last 12 months: no 11. History of any prior anesthesia complications: no    MEDICATIONS & ALLERGIES:    Patient reports the following regarding taking any blood thinners:   Plavix? no Aspirin? yes (81mg ) Coumadin? no Brilinta? no Xarelto? no Eliquis? no Pradaxa? no Savaysa? no Effient? no  Patient confirms/reports the following medications:  Current Outpatient Medications  Medication Sig Dispense Refill  . amLODipine (NORVASC) 10 MG tablet Take 10 mg by mouth daily.    Marland Kitchen aspirin EC 81 MG tablet Take 81 mg by mouth daily.    . Insulin Glargine (TOUJEO MAX SOLOSTAR) 300 UNIT/ML SOPN Inject 14 Units into the skin at bedtime.    . metoprolol tartrate (LOPRESSOR) 100 MG tablet Take 100 mg by mouth daily.    Marland Kitchen torsemide (DEMADEX) 100 MG tablet Take 100 mg by mouth daily.     No current facility-administered medications for this visit.     Patient confirms/reports the following allergies:  No Known Allergies  No orders of the defined types were placed in this encounter.   AUTHORIZATION INFORMATION Primary Insurance: medicare ,  ID #: [id number] Pre-Cert / Auth required:no  Secondary Insurance: [place] medicaid  ID #: [id number] o Pre-Cert / Auth required:  no  SCHEDULE INFORMATION: Procedure has been scheduled as follows:  Date: 11/24/17, Time:1:00 Location: [facility] Dr.Rourk  This Gastroenterology Pre-Precedure Review Form is being routed to the following provider(s): Walden Field NP

## 2017-10-24 DIAGNOSIS — D509 Iron deficiency anemia, unspecified: Secondary | ICD-10-CM | POA: Diagnosis not present

## 2017-10-24 DIAGNOSIS — N2581 Secondary hyperparathyroidism of renal origin: Secondary | ICD-10-CM | POA: Diagnosis not present

## 2017-10-24 DIAGNOSIS — D631 Anemia in chronic kidney disease: Secondary | ICD-10-CM | POA: Diagnosis not present

## 2017-10-24 DIAGNOSIS — N186 End stage renal disease: Secondary | ICD-10-CM | POA: Diagnosis not present

## 2017-10-24 DIAGNOSIS — Z992 Dependence on renal dialysis: Secondary | ICD-10-CM | POA: Diagnosis not present

## 2017-10-25 DIAGNOSIS — N186 End stage renal disease: Secondary | ICD-10-CM | POA: Diagnosis not present

## 2017-10-25 DIAGNOSIS — D631 Anemia in chronic kidney disease: Secondary | ICD-10-CM | POA: Diagnosis not present

## 2017-10-25 DIAGNOSIS — Z992 Dependence on renal dialysis: Secondary | ICD-10-CM | POA: Diagnosis not present

## 2017-10-25 DIAGNOSIS — N2581 Secondary hyperparathyroidism of renal origin: Secondary | ICD-10-CM | POA: Diagnosis not present

## 2017-10-25 DIAGNOSIS — D509 Iron deficiency anemia, unspecified: Secondary | ICD-10-CM | POA: Diagnosis not present

## 2017-10-26 DIAGNOSIS — D509 Iron deficiency anemia, unspecified: Secondary | ICD-10-CM | POA: Diagnosis not present

## 2017-10-26 DIAGNOSIS — Z992 Dependence on renal dialysis: Secondary | ICD-10-CM | POA: Diagnosis not present

## 2017-10-26 DIAGNOSIS — Z9289 Personal history of other medical treatment: Secondary | ICD-10-CM

## 2017-10-26 DIAGNOSIS — N2581 Secondary hyperparathyroidism of renal origin: Secondary | ICD-10-CM | POA: Diagnosis not present

## 2017-10-26 DIAGNOSIS — N186 End stage renal disease: Secondary | ICD-10-CM | POA: Diagnosis not present

## 2017-10-26 DIAGNOSIS — D631 Anemia in chronic kidney disease: Secondary | ICD-10-CM | POA: Diagnosis not present

## 2017-10-26 HISTORY — DX: Personal history of other medical treatment: Z92.89

## 2017-10-27 DIAGNOSIS — N186 End stage renal disease: Secondary | ICD-10-CM | POA: Diagnosis not present

## 2017-10-27 DIAGNOSIS — N2581 Secondary hyperparathyroidism of renal origin: Secondary | ICD-10-CM | POA: Diagnosis not present

## 2017-10-27 DIAGNOSIS — Z992 Dependence on renal dialysis: Secondary | ICD-10-CM | POA: Diagnosis not present

## 2017-10-27 DIAGNOSIS — D631 Anemia in chronic kidney disease: Secondary | ICD-10-CM | POA: Diagnosis not present

## 2017-10-27 DIAGNOSIS — D509 Iron deficiency anemia, unspecified: Secondary | ICD-10-CM | POA: Diagnosis not present

## 2017-10-27 NOTE — Progress Notes (Signed)
Ok to schedule. Per UpToDate for patient with CRI, use of PEG-ELS solution of Trilyte or Golytely.  DM meds: half night before and none morning of.  On prep day: Check CBG ac and hs as well as if the patient feels like their blood sugar is off. Can use soda, juice (that's in [place]) as needed for any low blood sugar.  Check CBG on arrival to endo unit.

## 2017-10-28 DIAGNOSIS — N186 End stage renal disease: Secondary | ICD-10-CM | POA: Diagnosis not present

## 2017-10-28 DIAGNOSIS — D509 Iron deficiency anemia, unspecified: Secondary | ICD-10-CM | POA: Diagnosis not present

## 2017-10-28 DIAGNOSIS — N2581 Secondary hyperparathyroidism of renal origin: Secondary | ICD-10-CM | POA: Diagnosis not present

## 2017-10-28 DIAGNOSIS — D631 Anemia in chronic kidney disease: Secondary | ICD-10-CM | POA: Diagnosis not present

## 2017-10-28 DIAGNOSIS — Z992 Dependence on renal dialysis: Secondary | ICD-10-CM | POA: Diagnosis not present

## 2017-10-29 DIAGNOSIS — D631 Anemia in chronic kidney disease: Secondary | ICD-10-CM | POA: Diagnosis not present

## 2017-10-29 DIAGNOSIS — D509 Iron deficiency anemia, unspecified: Secondary | ICD-10-CM | POA: Diagnosis not present

## 2017-10-29 DIAGNOSIS — N2581 Secondary hyperparathyroidism of renal origin: Secondary | ICD-10-CM | POA: Diagnosis not present

## 2017-10-29 DIAGNOSIS — Z992 Dependence on renal dialysis: Secondary | ICD-10-CM | POA: Diagnosis not present

## 2017-10-29 DIAGNOSIS — N186 End stage renal disease: Secondary | ICD-10-CM | POA: Diagnosis not present

## 2017-10-30 DIAGNOSIS — D631 Anemia in chronic kidney disease: Secondary | ICD-10-CM | POA: Diagnosis not present

## 2017-10-30 DIAGNOSIS — N2581 Secondary hyperparathyroidism of renal origin: Secondary | ICD-10-CM | POA: Diagnosis not present

## 2017-10-30 DIAGNOSIS — Z992 Dependence on renal dialysis: Secondary | ICD-10-CM | POA: Diagnosis not present

## 2017-10-30 DIAGNOSIS — D509 Iron deficiency anemia, unspecified: Secondary | ICD-10-CM | POA: Diagnosis not present

## 2017-10-30 DIAGNOSIS — N186 End stage renal disease: Secondary | ICD-10-CM | POA: Diagnosis not present

## 2017-10-30 MED ORDER — PEG 3350-KCL-NA BICARB-NACL 420 G PO SOLR: 4000.0000 mL | ORAL | 0 refills | Status: DC

## 2017-10-30 NOTE — Progress Notes (Signed)
Orders put in and instructions are done and have been mailed to the pt. DM med adjustments included in instructions.

## 2017-10-30 NOTE — Progress Notes (Signed)
Spoke with EG, pt needs to do a tap water enema instead of the fleets enema.

## 2017-10-30 NOTE — Addendum Note (Signed)
Addended by: Claudina Lick on: 10/30/2017 11:09 AM   Modules accepted: Orders, SmartSet

## 2017-10-30 NOTE — Patient Instructions (Signed)
Janet Mitchell   1965-11-04 MRN: [id number]    Procedure Date: 11/24/17 Time to register: 12:00pm Place to register: [facility] Procedure Time: 1:00pm Scheduled provider: R. Garfield Cornea, MD  PREPARATION FOR COLONOSCOPY WITH TRI-LYTE SPLIT PREP  Please notify us immediately if you are diabetic, take iron supplements, or if you are on Coumadin or any other blood thinners.    You will need to purchase 1 box of Bisacodyl 34m tablets, this is available over the counter and you can purchase at your pharmacy.    2 DAYS BEFORE PROCEDURE:  DATE: 11/22/17   DAY: Wednesday Begin clear liquid diet AFTER your lunch meal. NO SOLID FOODS after this point.  1 DAY BEFORE PROCEDURE:  DATE: 11/23/17   DAY: Thursday Continue clear liquids the entire day - NO SOLID FOOD.   Diabetic medications adjustments for today: only take 1/2 your normal dose of Toujeo.   At 2:00 pm:  Take 2 Bisacodyl tablets.   At 4:00pm:  Start drinking your solution. Make sure you mix well per instructions on the bottle. Try to drink 1 (one) 8 ounce glass every 10-15 minutes until you have consumed HALF the jug. You should complete by 6:00pm.You must keep the left over solution refrigerated until completed next day.  Continue clear liquids. You must drink plenty of clear liquids to prevent dehyration and kidney failure.     DAY OF PROCEDURE:   DATE: 11/24/17   DAY: Friday If you take medications for your heart, blood pressure or breathing, you may take these medications.  Diabetic medications adjustments for today: do not take any diabetes medication on this morning.   Five hours before your procedure time @ 8:00am:  Finish remaining amout of bowel prep, drinking 1 (one) 8 ounce glass every 10-15 minutes until complete. You have two hours to consume remaining prep.   Three hours before your procedure time _0 :00am:  Nothing by mouth.   At least one hour before going to the hospital:  Give yourself one tap water  enema. ( you can purchase a fleets enema and pour out the fleets solution and refill with warm tap water or you can purchase an enema kit and use warm tap water)  You may take your morning medications with sip of water unless we have instructed otherwise.      Please see below for Dietary Information.  CLEAR LIQUIDS INCLUDE:  Water Jello (NOT red in color)   Ice Popsicles (NOT red in color)   Tea (sugar ok, no milk/cream) Powdered fruit flavored drinks  Coffee (sugar ok, no milk/cream) Gatorade/ Lemonade/ Kool-Aid  (NOT red in color)   Juice: apple, white grape, white cranberry Soft drinks  Clear bullion, consomme, broth (fat free beef/chicken/vegetable)  Carbonated beverages (any kind)  Strained chicken noodle soup Hard Candy   Remember: Clear liquids are liquids that will allow you to see your fingers on the other side of a clear glass. Be sure liquids are NOT red in color, and not cloudy, but CLEAR.  DO NOT EAT OR DRINK ANY OF THE FOLLOWING:  Dairy products of any kind   Cranberry juice Tomato juice / V8 juice   Grapefruit juice Orange juice     Red grape juice  Do not eat any solid foods, including such foods as: cereal, oatmeal, yogurt, fruits, vegetables, creamed soups, eggs, bread, crackers, pureed foods in a blender, etc.   HELPFUL HINTS FOR DRINKING PREP SOLUTION:   Make sure prep is extremely cold. Mix  and refrigerate the the morning of the prep. You may also put in the freezer.   You may try mixing some Crystal Light or Country Time Lemonade if you prefer. Mix in small amounts; add more if necessary.  Try drinking through a straw  Rinse mouth with water or a mouthwash between glasses, to remove after-taste.  Try sipping on a cold beverage /ice/ popsicles between glasses of prep.  Place a piece of sugar-free hard candy in mouth between glasses.  If you become nauseated, try consuming smaller amounts, or stretch out the time between glasses. Stop for 30-60 minutes,  then slowly start back drinking.        OTHER INSTRUCTIONS  You will need a responsible adult at least 50 years of age to accompany you and drive you home. This person must remain in the waiting room during your procedure. The hospital will cancel your procedure if you do not have a responsible adult with you.   1. Wear loose fitting clothing that is easily removed. 2. Leave jewelry and other valuables at home.  3. Remove all body piercing jewelry and leave at home. 4. Total time from sign-in until discharge is approximately 2-3 hours. 5. You should go home directly after your procedure and rest. You can resume normal activities the day after your procedure. 6. The day of your procedure you should not:  Drive  Make legal decisions  Operate machinery  Drink alcohol  Return to work   You may call the office (Dept: [phone]) before 5:00pm, or page the doctor on call [phone]) after 5:00pm, for further instructions, if necessary.   Insurance Information YOU WILL NEED TO CHECK WITH YOUR INSURANCE COMPANY FOR THE BENEFITS OF COVERAGE YOU HAVE FOR THIS PROCEDURE.  UNFORTUNATELY, NOT ALL INSURANCE COMPANIES HAVE BENEFITS TO COVER ALL OR PART OF THESE TYPES OF PROCEDURES.  IT IS YOUR RESPONSIBILITY TO CHECK YOUR BENEFITS, HOWEVER, WE WILL BE GLAD TO ASSIST YOU WITH ANY CODES YOUR INSURANCE COMPANY MAY NEED.    PLEASE NOTE THAT MOST INSURANCE COMPANIES WILL NOT COVER A SCREENING COLONOSCOPY FOR PEOPLE UNDER THE AGE OF 50  IF YOU HAVE BCBS INSURANCE, YOU MAY HAVE BENEFITS FOR A SCREENING COLONOSCOPY BUT IF POLYPS ARE FOUND THE DIAGNOSIS WILL CHANGE AND THEN YOU MAY HAVE A DEDUCTIBLE THAT WILL NEED TO BE MET. SO PLEASE MAKE SURE YOU CHECK YOUR BENEFITS FOR A SCREENING COLONOSCOPY AS WELL AS A DIAGNOSTIC COLONOSCOPY.

## 2017-10-31 DIAGNOSIS — N186 End stage renal disease: Secondary | ICD-10-CM | POA: Diagnosis not present

## 2017-10-31 DIAGNOSIS — D631 Anemia in chronic kidney disease: Secondary | ICD-10-CM | POA: Diagnosis not present

## 2017-10-31 DIAGNOSIS — Z992 Dependence on renal dialysis: Secondary | ICD-10-CM | POA: Diagnosis not present

## 2017-10-31 DIAGNOSIS — N2581 Secondary hyperparathyroidism of renal origin: Secondary | ICD-10-CM | POA: Diagnosis not present

## 2017-10-31 DIAGNOSIS — D509 Iron deficiency anemia, unspecified: Secondary | ICD-10-CM | POA: Diagnosis not present

## 2017-11-01 DIAGNOSIS — N186 End stage renal disease: Secondary | ICD-10-CM | POA: Diagnosis not present

## 2017-11-01 DIAGNOSIS — D509 Iron deficiency anemia, unspecified: Secondary | ICD-10-CM | POA: Diagnosis not present

## 2017-11-01 DIAGNOSIS — N2581 Secondary hyperparathyroidism of renal origin: Secondary | ICD-10-CM | POA: Diagnosis not present

## 2017-11-01 DIAGNOSIS — D631 Anemia in chronic kidney disease: Secondary | ICD-10-CM | POA: Diagnosis not present

## 2017-11-01 DIAGNOSIS — Z992 Dependence on renal dialysis: Secondary | ICD-10-CM | POA: Diagnosis not present

## 2017-11-02 DIAGNOSIS — Z992 Dependence on renal dialysis: Secondary | ICD-10-CM | POA: Diagnosis not present

## 2017-11-02 DIAGNOSIS — D509 Iron deficiency anemia, unspecified: Secondary | ICD-10-CM | POA: Diagnosis not present

## 2017-11-02 DIAGNOSIS — D631 Anemia in chronic kidney disease: Secondary | ICD-10-CM | POA: Diagnosis not present

## 2017-11-02 DIAGNOSIS — N186 End stage renal disease: Secondary | ICD-10-CM | POA: Diagnosis not present

## 2017-11-02 DIAGNOSIS — N2581 Secondary hyperparathyroidism of renal origin: Secondary | ICD-10-CM | POA: Diagnosis not present

## 2017-11-03 DIAGNOSIS — Z992 Dependence on renal dialysis: Secondary | ICD-10-CM | POA: Diagnosis not present

## 2017-11-03 DIAGNOSIS — D631 Anemia in chronic kidney disease: Secondary | ICD-10-CM | POA: Diagnosis not present

## 2017-11-03 DIAGNOSIS — D509 Iron deficiency anemia, unspecified: Secondary | ICD-10-CM | POA: Diagnosis not present

## 2017-11-03 DIAGNOSIS — N186 End stage renal disease: Secondary | ICD-10-CM | POA: Diagnosis not present

## 2017-11-03 DIAGNOSIS — N2581 Secondary hyperparathyroidism of renal origin: Secondary | ICD-10-CM | POA: Diagnosis not present

## 2017-11-04 DIAGNOSIS — D631 Anemia in chronic kidney disease: Secondary | ICD-10-CM | POA: Diagnosis not present

## 2017-11-04 DIAGNOSIS — D509 Iron deficiency anemia, unspecified: Secondary | ICD-10-CM | POA: Diagnosis not present

## 2017-11-04 DIAGNOSIS — Z992 Dependence on renal dialysis: Secondary | ICD-10-CM | POA: Diagnosis not present

## 2017-11-04 DIAGNOSIS — N186 End stage renal disease: Secondary | ICD-10-CM | POA: Diagnosis not present

## 2017-11-04 DIAGNOSIS — N2581 Secondary hyperparathyroidism of renal origin: Secondary | ICD-10-CM | POA: Diagnosis not present

## 2017-11-05 DIAGNOSIS — D509 Iron deficiency anemia, unspecified: Secondary | ICD-10-CM | POA: Diagnosis not present

## 2017-11-05 DIAGNOSIS — N2581 Secondary hyperparathyroidism of renal origin: Secondary | ICD-10-CM | POA: Diagnosis not present

## 2017-11-05 DIAGNOSIS — Z992 Dependence on renal dialysis: Secondary | ICD-10-CM | POA: Diagnosis not present

## 2017-11-05 DIAGNOSIS — N186 End stage renal disease: Secondary | ICD-10-CM | POA: Diagnosis not present

## 2017-11-05 DIAGNOSIS — D631 Anemia in chronic kidney disease: Secondary | ICD-10-CM | POA: Diagnosis not present

## 2017-11-06 ENCOUNTER — Other Ambulatory Visit: Payer: Self-pay

## 2017-11-06 ENCOUNTER — Encounter (HOSPITAL_COMMUNITY): Payer: Self-pay | Admitting: Emergency Medicine

## 2017-11-06 ENCOUNTER — Inpatient Hospital Stay (HOSPITAL_COMMUNITY)
Admission: EM | Admit: 2017-11-06 | Discharge: 2017-11-09 | DRG: 811 | Disposition: A | Discharge status: Home / Self Care | Payer: Medicare Other | Attending: Family Medicine | Admitting: Family Medicine

## 2017-11-06 DIAGNOSIS — N186 End stage renal disease: Secondary | ICD-10-CM | POA: Diagnosis present

## 2017-11-06 DIAGNOSIS — R0600 Dyspnea, unspecified: Secondary | ICD-10-CM

## 2017-11-06 DIAGNOSIS — D631 Anemia in chronic kidney disease: Secondary | ICD-10-CM | POA: Diagnosis present

## 2017-11-06 DIAGNOSIS — Z992 Dependence on renal dialysis: Secondary | ICD-10-CM

## 2017-11-06 DIAGNOSIS — I129 Hypertensive chronic kidney disease with stage 1 through stage 4 chronic kidney disease, or unspecified chronic kidney disease: Secondary | ICD-10-CM | POA: Diagnosis not present

## 2017-11-06 DIAGNOSIS — E1122 Type 2 diabetes mellitus with diabetic chronic kidney disease: Secondary | ICD-10-CM | POA: Diagnosis not present

## 2017-11-06 DIAGNOSIS — Z96642 Presence of left artificial hip joint: Secondary | ICD-10-CM | POA: Diagnosis present

## 2017-11-06 DIAGNOSIS — I1311 Hypertensive heart and chronic kidney disease without heart failure, with stage 5 chronic kidney disease, or end stage renal disease: Secondary | ICD-10-CM | POA: Diagnosis not present

## 2017-11-06 DIAGNOSIS — N185 Chronic kidney disease, stage 5: Secondary | ICD-10-CM | POA: Diagnosis not present

## 2017-11-06 DIAGNOSIS — I509 Heart failure, unspecified: Secondary | ICD-10-CM | POA: Diagnosis present

## 2017-11-06 DIAGNOSIS — M898X9 Other specified disorders of bone, unspecified site: Secondary | ICD-10-CM | POA: Diagnosis present

## 2017-11-06 DIAGNOSIS — Z8249 Family history of ischemic heart disease and other diseases of the circulatory system: Secondary | ICD-10-CM

## 2017-11-06 DIAGNOSIS — I10 Essential (primary) hypertension: Secondary | ICD-10-CM | POA: Diagnosis present

## 2017-11-06 DIAGNOSIS — J9601 Acute respiratory failure with hypoxia: Secondary | ICD-10-CM | POA: Diagnosis not present

## 2017-11-06 DIAGNOSIS — Z6841 Body Mass Index (BMI) 40.0 and over, adult: Secondary | ICD-10-CM

## 2017-11-06 DIAGNOSIS — D509 Iron deficiency anemia, unspecified: Secondary | ICD-10-CM | POA: Diagnosis not present

## 2017-11-06 DIAGNOSIS — Z87891 Personal history of nicotine dependence: Secondary | ICD-10-CM

## 2017-11-06 DIAGNOSIS — D649 Anemia, unspecified: Secondary | ICD-10-CM

## 2017-11-06 DIAGNOSIS — N2581 Secondary hyperparathyroidism of renal origin: Secondary | ICD-10-CM | POA: Diagnosis not present

## 2017-11-06 HISTORY — DX: Essential (primary) hypertension: I10

## 2017-11-06 HISTORY — DX: Heart failure, unspecified: I50.9

## 2017-11-06 LAB — CBC WITH DIFFERENTIAL/PLATELET
BASOS PCT: 1 %
Basophils Absolute: 0.1 10*3/uL (ref 0.0–0.1)
EOS PCT: 2 %
Eosinophils Absolute: 0.2 10*3/uL (ref 0.0–0.7)
HEMATOCRIT: 20.3 % — AB (ref 36.0–46.0)
HEMOGLOBIN: 6.2 g/dL — AB (ref 12.0–15.0)
LYMPHS ABS: 1.7 10*3/uL (ref 0.7–4.0)
Lymphocytes Relative: 18 %
MCH: 22.9 pg — AB (ref 26.0–34.0)
MCHC: 30.5 g/dL (ref 30.0–36.0)
MCV: 74.9 fL — AB (ref 78.0–100.0)
MONOS PCT: 12 %
Monocytes Absolute: 1.1 10*3/uL — ABNORMAL HIGH (ref 0.1–1.0)
NEUTROS ABS: 6.3 10*3/uL (ref 1.7–7.7)
Neutrophils Relative %: 67 %
OTHER: 0 %
Platelets: 188 10*3/uL (ref 150–400)
RBC: 2.71 MIL/uL — AB (ref 3.87–5.11)
RDW: 19.1 % — AB (ref 11.5–15.5)
WBC: 9.4 10*3/uL (ref 4.0–10.5)

## 2017-11-06 LAB — PREPARE RBC (CROSSMATCH)

## 2017-11-06 LAB — BASIC METABOLIC PANEL
Anion gap: 13 (ref 5–15)
BUN: 90 mg/dL — AB (ref 6–20)
CALCIUM: 8.2 mg/dL — AB (ref 8.9–10.3)
CHLORIDE: 97 mmol/L — AB (ref 98–111)
CO2: 23 mmol/L (ref 22–32)
CREATININE: 13.93 mg/dL — AB (ref 0.44–1.00)
GFR calc non Af Amer: 3 mL/min — ABNORMAL LOW (ref >60)
GFR, EST AFRICAN AMERICAN: 3 mL/min — AB (ref >60)
GLUCOSE: 163 mg/dL — AB (ref 70–99)
Potassium: 4.3 mmol/L (ref 3.5–5.1)
Sodium: 133 mmol/L — ABNORMAL LOW (ref 135–145)

## 2017-11-06 LAB — ABO/RH: ABO/RH(D): A POS

## 2017-11-06 MED ORDER — CALCITRIOL 0.5 MCG PO CAPS: 0.5000 ug | ORAL_CAPSULE | ORAL | Status: DC

## 2017-11-06 MED ORDER — AMLODIPINE BESYLATE 10 MG PO TABS
10.0000 mg | ORAL_TABLET | Freq: Every day | ORAL | Status: DC
  Administered 2017-11-07 – 2017-11-08 (×3): 10 mg via ORAL
  Filled 2017-11-06 (×2): qty 1
  Filled 2017-11-06: qty 2

## 2017-11-06 MED ORDER — ASPIRIN EC 81 MG PO TBEC
81.0000 mg | DELAYED_RELEASE_TABLET | Freq: Every day | ORAL | Status: DC
  Administered 2017-11-07 – 2017-11-08 (×3): 81 mg via ORAL
  Filled 2017-11-06 (×3): qty 1

## 2017-11-06 MED ORDER — METOPROLOL SUCCINATE ER 100 MG PO TB24
100.0000 mg | ORAL_TABLET | Freq: Every day | ORAL | Status: DC
  Administered 2017-11-07 – 2017-11-08 (×3): 100 mg via ORAL
  Filled 2017-11-06: qty 1
  Filled 2017-11-06: qty 4
  Filled 2017-11-06: qty 1

## 2017-11-06 MED ORDER — INSULIN GLARGINE 100 UNIT/ML ~~LOC~~ SOLN
14.0000 [IU] | Freq: Every day | SUBCUTANEOUS | Status: DC
  Administered 2017-11-07 – 2017-11-08 (×3): 14 [IU] via SUBCUTANEOUS
  Filled 2017-11-06 (×4): qty 0.14

## 2017-11-06 MED ORDER — SODIUM CHLORIDE 0.9 % IV SOLN
10.0000 mL/h | Freq: Once | INTRAVENOUS | Status: AC
Start: 2017-11-06 — End: 2017-11-07
  Administered 2017-11-07: 10 mL/h via INTRAVENOUS

## 2017-11-06 MED ORDER — CALCITRIOL 0.5 MCG PO CAPS
0.5000 ug | ORAL_CAPSULE | ORAL | Status: DC
  Administered 2017-11-07 – 2017-11-08 (×2): 0.5 ug via ORAL
  Filled 2017-11-06 (×2): qty 2
  Filled 2017-11-06: qty 1
  Filled 2017-11-06: qty 2

## 2017-11-06 MED ORDER — CALCITRIOL 0.5 MCG PO CAPS: 1.0000 ug | ORAL_CAPSULE | ORAL | Status: DC

## 2017-11-06 NOTE — H&P (Addendum)
History and Physical    [name] OXB:[id number] DOB: 05/17/1965 DOA: 11/06/2017  PCP: Janet Lei, MD   Patient coming from: Home.  I have personally briefly reviewed patient's old medical records in [place]  Chief Complaint: Abnormal lab result.  HPI: Janet Mitchell is a 52 y.o. female with medical history significant of unspecified CHF, type 2 diabetes, hypertension, ESRD on peritoneal dialysis who is to the emergency department due to having a low hemoglobin level.  She complains of frequent tiredness, fatigue and dyspnea, particularly on exertion.  She denies chest pain, palpitations, diaphoresis, PND or orthopnea.  She has perennial lower extremity edema.  She denies abdominal pain, nausea, emesis, diarrhea, constipation, melena or hematochezia.  No dysuria, frequency or hematuria.  No heat or cold intolerance.  Denies polyuria, polydipsia, polyphagia or blurred vision.  No skin rashes or pruritus.  ED Course: Temperature was 98.5 F, pulse 79, respirations 18, blood pressure 131/61 mmHg and O2 sat 94% on room air.  Her work-up shows a white count of 9.4, hemoglobin 6.2 g/dL and platelets 188.  Sodium 143, potassium 4.3, chloride 97 and CO2 23 mmol/L.  Her glucose of 163, BUN 90, creatinine 13.93 and calcium 8.2 mg/dL.  Her type and screen showed A+ blood type.  Review of Systems: As per HPI otherwise 10 point review of systems negative.   Past Medical History:  Diagnosis Date  . CHF (congestive heart failure) ([place])   . Diabetes mellitus without complication ([place])   . Hypertension   . Renal disorder     History reviewed. No pertinent surgical history.   reports that she has quit smoking. She has never used smokeless tobacco. She reports that she drank alcohol. She reports that she has current or past drug history.  No Known Allergies  Family History  Problem Relation Age of Onset  . Heart disease Mother   . Thrombocytopenia Mother        TTP  . Heart  failure Father   . Kidney disease Paternal Grandfather     Prior to Admission medications   Medication Sig Start Date End Date Taking? Authorizing Provider  amLODipine (NORVASC) 10 MG tablet Take 10 mg by mouth at bedtime.    Yes [provider]  aspirin EC 81 MG tablet Take 81 mg by mouth at bedtime.    Yes [provider]  calcitRIOL (ROCALTROL) 0.5 MCG capsule Take 0.5-1 mcg by mouth See admin instructions. 0.20mcg on Mondays through Fridays and take 85mcg on Saturdays and Sundays   Yes [provider]  Insulin Glargine (TOUJEO MAX SOLOSTAR) 300 UNIT/ML SOPN Inject 14 Units into the skin at bedtime.   Yes [provider]  metoprolol succinate (TOPROL-XL) 100 MG 24 hr tablet Take 100 mg by mouth at bedtime. Take with or immediately following a meal.   Yes [provider]  torsemide (DEMADEX) 100 MG tablet Take 100 mg by mouth at bedtime.    Yes [provider]  polyethylene glycol-electrolytes (TRILYTE) 420 g solution Take 4,000 mLs by mouth as directed. 10/30/17   Carlis Stable, NP    Physical Exam: Vitals:   11/06/17 1951 11/06/17 2231 11/06/17 2233  BP: 131/61 (!) 153/71   Pulse: 79  78  Resp: 18    Temp: 98.5 F (36.9 C)    SpO2: 94%  94%  Weight: 122.5 kg    Height: 5\' 8"  (1.727 m)      Constitutional: NAD, calm, comfortable Eyes: PERRL, lids and  conjunctivae are pale. ENMT: Mucous membranes are moist. Posterior pharynx clear of any exudate or lesions. Neck: normal, supple, no masses, no thyromegaly Respiratory: clear to auscultation bilaterally, no wheezing, no crackles. Normal respiratory effort. No accessory muscle use.  Cardiovascular: Regular rate and rhythm, no murmurs / rubs / gallops.  2+ lower extremity edema.  Positive stage I lymphedema.  2+ pedal pulses. No carotid bruits.  Abdomen: PD stoma, soft no tenderness, no masses palpated. No hepatosplenomegaly. Bowel sounds positive.  Musculoskeletal: no clubbing /  cyanosis. No joint deformity upper and lower extremities. Good ROM, no contractures. Normal muscle tone.  Skin: no rashes, lesions, ulcers on limited dermatological examination. Neurologic: CN 2-12 grossly intact. Sensation intact, DTR normal. Strength 5/5 in all 4.  Psychiatric: Normal judgment and insight. Alert and oriented x 3. Normal mood.    Labs on Admission: I have personally reviewed following labs and imaging studies  CBC: Recent Labs  Lab 11/06/17 2029  WBC 9.4  NEUTROABS 6.3  HGB 6.2*  HCT 20.3*  MCV 74.9*  PLT 737   Basic Metabolic Panel: Recent Labs  Lab 11/06/17 2029  NA 133*  K 4.3  CL 97*  CO2 23  GLUCOSE 163*  BUN 90*  CREATININE 13.93*  CALCIUM 8.2*   GFR: Estimated Creatinine Clearance: 6.7 mL/min (A) (by C-G formula based on SCr of 13.93 mg/dL (H)). Liver Function Tests: No results for input(s): AST, ALT, ALKPHOS, BILITOT, PROT, ALBUMIN in the last 168 hours. No results for input(s): LIPASE, AMYLASE in the last 168 hours. No results for input(s): AMMONIA in the last 168 hours. Coagulation Profile: No results for input(s): INR, PROTIME in the last 168 hours. Cardiac Enzymes: No results for input(s): CKTOTAL, CKMB, CKMBINDEX, TROPONINI in the last 168 hours. BNP (last 3 results) No results for input(s): PROBNP in the last 8760 hours. HbA1C: No results for input(s): HGBA1C in the last 72 hours. CBG: No results for input(s): GLUCAP in the last 168 hours. Lipid Profile: No results for input(s): CHOL, HDL, LDLCALC, TRIG, CHOLHDL, LDLDIRECT in the last 72 hours. Thyroid Function Tests: No results for input(s): TSH, T4TOTAL, FREET4, T3FREE, THYROIDAB in the last 72 hours. Anemia Panel: No results for input(s): VITAMINB12, FOLATE, FERRITIN, TIBC, IRON, RETICCTPCT in the last 72 hours. Urine analysis: No results found for: COLORURINE, APPEARANCEUR, LABSPEC, PHURINE, GLUCOSEU, HGBUR, BILIRUBINUR, KETONESUR, PROTEINUR, UROBILINOGEN, NITRITE,  LEUKOCYTESUR  Radiological Exams on Admission: No results found.  EKG: Independently reviewed.   Assessment/Plan Principal Problem:   Symptomatic anemia Secondary to ESRD.  No need to transfer to [facility] per nephrology on call. May give furosemide 80 mg IVP with transfusion. Observation/MedSurg. Transfuse PRBC x2 units. Follow-up hematocrit and hemoglobin in a.m. Epogen per nephrology.  Active Problems:   Hypertension Continue amlodipine 10 mg p.o. daily at bedtime. Continue metoprolol XL 100 mg p.o. at bedtime. Monitor blood pressure and heart rate.    ESRD on peritoneal dialysis [facility]) Will need to do extra hours of PD at home. Check with Dr. Florentina Addison office in a.m. to provide further instructions.    DVT prophylaxis: SCDs. Code Status: Full code. Family Communication:  Disposition Plan: Overnight observation for PRBC transfusion. Consults called:  Admission status: Observation/MedSurg.   Reubin Milan MD [facility] Pager [phone].  If 7PM-7AM, please contact night-coverage [email or web address] Password [email or web address]  11/06/2017, 11:01 PM

## 2017-11-06 NOTE — ED Triage Notes (Signed)
Pt sent over by Dr Hinda Lenis due to hgb being 5. Pt is peritoneal dialysis pt and had dialysis last night.

## 2017-11-06 NOTE — ED Notes (Signed)
Date and time results received: 11/06/17 8:57 PM  (use smartphrase ".now" to insert current time)  Test:  Hemoglobin    Critical Value:  6.2   Name of Provider Notified: Hillard Danker  Orders Received? Or Actions Taken?: none at this time.

## 2017-11-06 NOTE — ED Notes (Signed)
Informed pt need for stool sample

## 2017-11-06 NOTE — ED Provider Notes (Signed)
[facility] EMERGENCY DEPARTMENT Provider Note   CSN: [id number] Arrival date & time: 11/06/17  1940     History   Chief Complaint Chief Complaint  Patient presents with  . Abnormal Lab    HPI Janet Mitchell is a 52 y.o. female.  HPI Patient presents to the emergency room for evaluation of anemia.  Patient has a history of chronic kidney disease.  She is on peritoneal dialysis.  Patient states normally her hemoglobin is low but usually around 7.  She had routine outpatient laboratory test done by her nephrologist recently.  She was called today and told that her result was abnormal.  Her hemoglobin was 5 and she needed to go to the emergency room.  She denies any rectal bleeding.  She does not notice any blood in her stools.  She has felt very fatigued and this is exacerbated by any activity.  Has had blood transfusions in the past for anemia Past Medical History:  Diagnosis Date  . CHF (congestive heart failure) ([place])   . Diabetes mellitus without complication ([place])   . Hypertension   . Renal disorder     There are no active problems to display for this patient.   History reviewed. No pertinent surgical history.   OB History   None      Home Medications    Prior to Admission medications   Medication Sig Start Date End Date Taking? Authorizing Provider  amLODipine (NORVASC) 10 MG tablet Take 10 mg by mouth daily.    [provider]  aspirin EC 81 MG tablet Take 81 mg by mouth daily.    [provider]  Insulin Glargine (TOUJEO MAX SOLOSTAR) 300 UNIT/ML SOPN Inject 14 Units into the skin at bedtime.    [provider]  metoprolol tartrate (LOPRESSOR) 100 MG tablet Take 100 mg by mouth daily.    [provider]  polyethylene glycol-electrolytes (TRILYTE) 420 g solution Take 4,000 mLs by mouth as directed. 10/30/17   Carlis Stable, NP  torsemide (DEMADEX) 100 MG tablet Take 100 mg by mouth daily.    [provider]     Family History History reviewed. No pertinent family history.  Social History Social History   Tobacco Use  . Smoking status: Former Research scientist (life sciences)  . Smokeless tobacco: Never Used  Substance Use Topics  . Alcohol use: Not Currently  . Drug use: Not Currently     Allergies   Patient has no known allergies.   Review of Systems Review of Systems  All other systems reviewed and are negative.    Physical Exam Updated Vital Signs BP 131/61   Pulse 79   Temp 98.5 F (36.9 C)   Resp 18   Ht 1.727 m (5\' 8" )   Wt 122.5 kg   LMP 10/30/2017   SpO2 94%   BMI 41.05 kg/m   Physical Exam  Constitutional: No distress.  HENT:  Head: Normocephalic and atraumatic.  Right Ear: External ear normal.  Left Ear: External ear normal.  Eyes: Conjunctivae are normal. Right eye exhibits no discharge. Left eye exhibits no discharge. No scleral icterus.  Neck: Neck supple. No tracheal deviation present.  Cardiovascular: Normal rate, regular rhythm and intact distal pulses.  Pulmonary/Chest: Effort normal and breath sounds normal. No stridor. No respiratory distress. She has no wheezes. She has no rales.  Abdominal: Soft. Bowel sounds are normal. She exhibits no distension. There is no tenderness. There is no rebound and no guarding.  Musculoskeletal: She  exhibits no edema or tenderness.  Neurological: She is alert. She has normal strength. No cranial nerve deficit (no facial droop, extraocular movements intact, no slurred speech) or sensory deficit. She exhibits normal muscle tone. She displays no seizure activity. Coordination normal.  Skin: Skin is warm and dry. No rash noted. She is not diaphoretic. There is pallor.  Psychiatric: She has a normal mood and affect.  Nursing note and vitals reviewed.    ED Treatments / Results  Labs (all labs ordered are listed, but only abnormal results are displayed) Labs Reviewed  CBC WITH DIFFERENTIAL/PLATELET - Abnormal; Notable for the following  components:      Result Value   RBC 2.71 (*)    Hemoglobin 6.2 (*)    HCT 20.3 (*)    MCV 74.9 (*)    MCH 22.9 (*)    RDW 19.1 (*)    Monocytes Absolute 1.1 (*)    All other components within normal limits  BASIC METABOLIC PANEL - Abnormal; Notable for the following components:   Sodium 133 (*)    Chloride 97 (*)    Glucose, Bld 163 (*)    BUN 90 (*)    Creatinine, Ser 13.93 (*)    Calcium 8.2 (*)    GFR calc non Af Amer 3 (*)    GFR calc Af Amer 3 (*)    All other components within normal limits  POC OCCULT BLOOD, ED  TYPE AND SCREEN  PREPARE RBC (CROSSMATCH)     Procedures .Critical Care Performed by: Dorie Rank, MD Authorized by: Dorie Rank, MD   Critical care provider statement:    Critical care time (minutes):  30   Critical care was time spent personally by me on the following activities:  Discussions with consultants, evaluation of patient's response to treatment, examination of patient, ordering and performing treatments and interventions, ordering and review of laboratory studies, ordering and review of radiographic studies, pulse oximetry, re-evaluation of patient's condition, obtaining history from patient or surrogate and review of old charts   (including critical care time)  Medications Ordered in ED Medications  0.9 %  sodium chloride infusion (has no administration in time range)     Initial Impression / Assessment and Plan / ED Course  I have reviewed the triage vital signs and the nursing notes.  Pertinent labs & imaging results that were available during my care of the patient were reviewed by me and considered in my medical decision making (see chart for details).  Clinical Course as of Nov 07 2143  Mon Nov 06, 2017  2110 Hgb is decreased at 6.2  Pt is symptomatic.  Will guaic stool.  Plan on blood transfusion.   [JK]    Clinical Course User Index [JK] Dorie Rank, MD    Patient presented to the emergency room for evaluation of anemia.   Patient had laboratory tests indicating hemoglobin of 5.  Today her hemoglobin is 6.2.  Patient is not having any symptoms to suggest GI bleeding.   Patient is symptomatic.  She is fatigued and short of breath.  I have ordered blood transfusions.  I will consult with the medical service for admission. Final Clinical Impressions(s) / ED Diagnoses   Final diagnoses:  Symptomatic anemia  Chronic renal failure, stage 5 (HCC)       Dorie Rank, MD 11/06/17 2146

## 2017-11-07 ENCOUNTER — Other Ambulatory Visit: Payer: Self-pay

## 2017-11-07 ENCOUNTER — Observation Stay (HOSPITAL_COMMUNITY): Payer: Medicare Other

## 2017-11-07 DIAGNOSIS — N186 End stage renal disease: Secondary | ICD-10-CM | POA: Diagnosis not present

## 2017-11-07 DIAGNOSIS — Z992 Dependence on renal dialysis: Secondary | ICD-10-CM | POA: Diagnosis not present

## 2017-11-07 DIAGNOSIS — Z87891 Personal history of nicotine dependence: Secondary | ICD-10-CM | POA: Diagnosis not present

## 2017-11-07 DIAGNOSIS — D631 Anemia in chronic kidney disease: Secondary | ICD-10-CM | POA: Diagnosis not present

## 2017-11-07 DIAGNOSIS — E1122 Type 2 diabetes mellitus with diabetic chronic kidney disease: Secondary | ICD-10-CM | POA: Diagnosis not present

## 2017-11-07 DIAGNOSIS — N2581 Secondary hyperparathyroidism of renal origin: Secondary | ICD-10-CM | POA: Diagnosis not present

## 2017-11-07 DIAGNOSIS — E8770 Fluid overload, unspecified: Secondary | ICD-10-CM | POA: Diagnosis not present

## 2017-11-07 DIAGNOSIS — Z6841 Body Mass Index (BMI) 40.0 and over, adult: Secondary | ICD-10-CM | POA: Diagnosis not present

## 2017-11-07 DIAGNOSIS — J9601 Acute respiratory failure with hypoxia: Secondary | ICD-10-CM | POA: Diagnosis not present

## 2017-11-07 DIAGNOSIS — R0602 Shortness of breath: Secondary | ICD-10-CM | POA: Diagnosis not present

## 2017-11-07 DIAGNOSIS — I120 Hypertensive chronic kidney disease with stage 5 chronic kidney disease or end stage renal disease: Secondary | ICD-10-CM | POA: Diagnosis not present

## 2017-11-07 DIAGNOSIS — D509 Iron deficiency anemia, unspecified: Secondary | ICD-10-CM | POA: Diagnosis not present

## 2017-11-07 DIAGNOSIS — I1311 Hypertensive heart and chronic kidney disease without heart failure, with stage 5 chronic kidney disease, or end stage renal disease: Secondary | ICD-10-CM | POA: Diagnosis not present

## 2017-11-07 DIAGNOSIS — Z8249 Family history of ischemic heart disease and other diseases of the circulatory system: Secondary | ICD-10-CM | POA: Diagnosis not present

## 2017-11-07 DIAGNOSIS — M898X9 Other specified disorders of bone, unspecified site: Secondary | ICD-10-CM | POA: Diagnosis not present

## 2017-11-07 DIAGNOSIS — I509 Heart failure, unspecified: Secondary | ICD-10-CM | POA: Diagnosis not present

## 2017-11-07 DIAGNOSIS — D649 Anemia, unspecified: Secondary | ICD-10-CM | POA: Diagnosis not present

## 2017-11-07 DIAGNOSIS — Z96642 Presence of left artificial hip joint: Secondary | ICD-10-CM | POA: Diagnosis present

## 2017-11-07 LAB — CBC
HCT: 25.7 % — ABNORMAL LOW (ref 36.0–46.0)
Hemoglobin: 8.3 g/dL — ABNORMAL LOW (ref 12.0–15.0)
MCH: 25 pg — ABNORMAL LOW (ref 26.0–34.0)
MCHC: 32.3 g/dL (ref 30.0–36.0)
MCV: 77.4 fL — ABNORMAL LOW (ref 78.0–100.0)
Platelets: 178 10*3/uL (ref 150–400)
RBC: 3.32 MIL/uL — ABNORMAL LOW (ref 3.87–5.11)
RDW: 17.6 % — ABNORMAL HIGH (ref 11.5–15.5)
WBC: 10.4 10*3/uL (ref 4.0–10.5)

## 2017-11-07 LAB — CBC WITH DIFFERENTIAL/PLATELET
BASOS PCT: 1 %
Basophils Absolute: 0.1 10*3/uL (ref 0.0–0.1)
Eosinophils Absolute: 0.2 10*3/uL (ref 0.0–0.7)
Eosinophils Relative: 2 %
HEMATOCRIT: 22.2 % — AB (ref 36.0–46.0)
Hemoglobin: 7.1 g/dL — ABNORMAL LOW (ref 12.0–15.0)
Lymphocytes Relative: 14 %
Lymphs Abs: 1.2 10*3/uL (ref 0.7–4.0)
MCH: 24.4 pg — ABNORMAL LOW (ref 26.0–34.0)
MCHC: 32 g/dL (ref 30.0–36.0)
MCV: 76.3 fL — ABNORMAL LOW (ref 78.0–100.0)
MONOS PCT: 13 %
Monocytes Absolute: 1.1 10*3/uL — ABNORMAL HIGH (ref 0.1–1.0)
Neutro Abs: 6.1 10*3/uL (ref 1.7–7.7)
Neutrophils Relative %: 70 %
Platelets: 151 10*3/uL (ref 150–400)
RBC: 2.91 MIL/uL — ABNORMAL LOW (ref 3.87–5.11)
RDW: 17.9 % — ABNORMAL HIGH (ref 11.5–15.5)
WBC: 8.7 10*3/uL (ref 4.0–10.5)

## 2017-11-07 LAB — BASIC METABOLIC PANEL
ANION GAP: 12 (ref 5–15)
BUN: 89 mg/dL — ABNORMAL HIGH (ref 6–20)
CHLORIDE: 96 mmol/L — AB (ref 98–111)
CO2: 22 mmol/L (ref 22–32)
Calcium: 7.8 mg/dL — ABNORMAL LOW (ref 8.9–10.3)
Creatinine, Ser: 14.63 mg/dL — ABNORMAL HIGH (ref 0.44–1.00)
GFR calc non Af Amer: 3 mL/min — ABNORMAL LOW (ref >60)
GFR, EST AFRICAN AMERICAN: 3 mL/min — AB (ref >60)
Glucose, Bld: 111 mg/dL — ABNORMAL HIGH (ref 70–99)
Potassium: 4.5 mmol/L (ref 3.5–5.1)
Sodium: 130 mmol/L — ABNORMAL LOW (ref 135–145)

## 2017-11-07 LAB — TYPE AND SCREEN
ABO/RH(D): A POS
Antibody Screen: NEGATIVE

## 2017-11-07 LAB — ABO/RH: ABO/RH(D): A POS

## 2017-11-07 LAB — GLUCOSE, CAPILLARY: GLUCOSE-CAPILLARY: 84 mg/dL (ref 70–99)

## 2017-11-07 LAB — PREPARE RBC (CROSSMATCH)

## 2017-11-07 IMAGING — CR DG CHEST 1V PORT
1 series · 1 of 1 positions shown · non-contrast
Comparison: None.

CLINICAL DATA: Shortness of breath this morning

EXAM:
PORTABLE CHEST 1 VIEW

[portable]
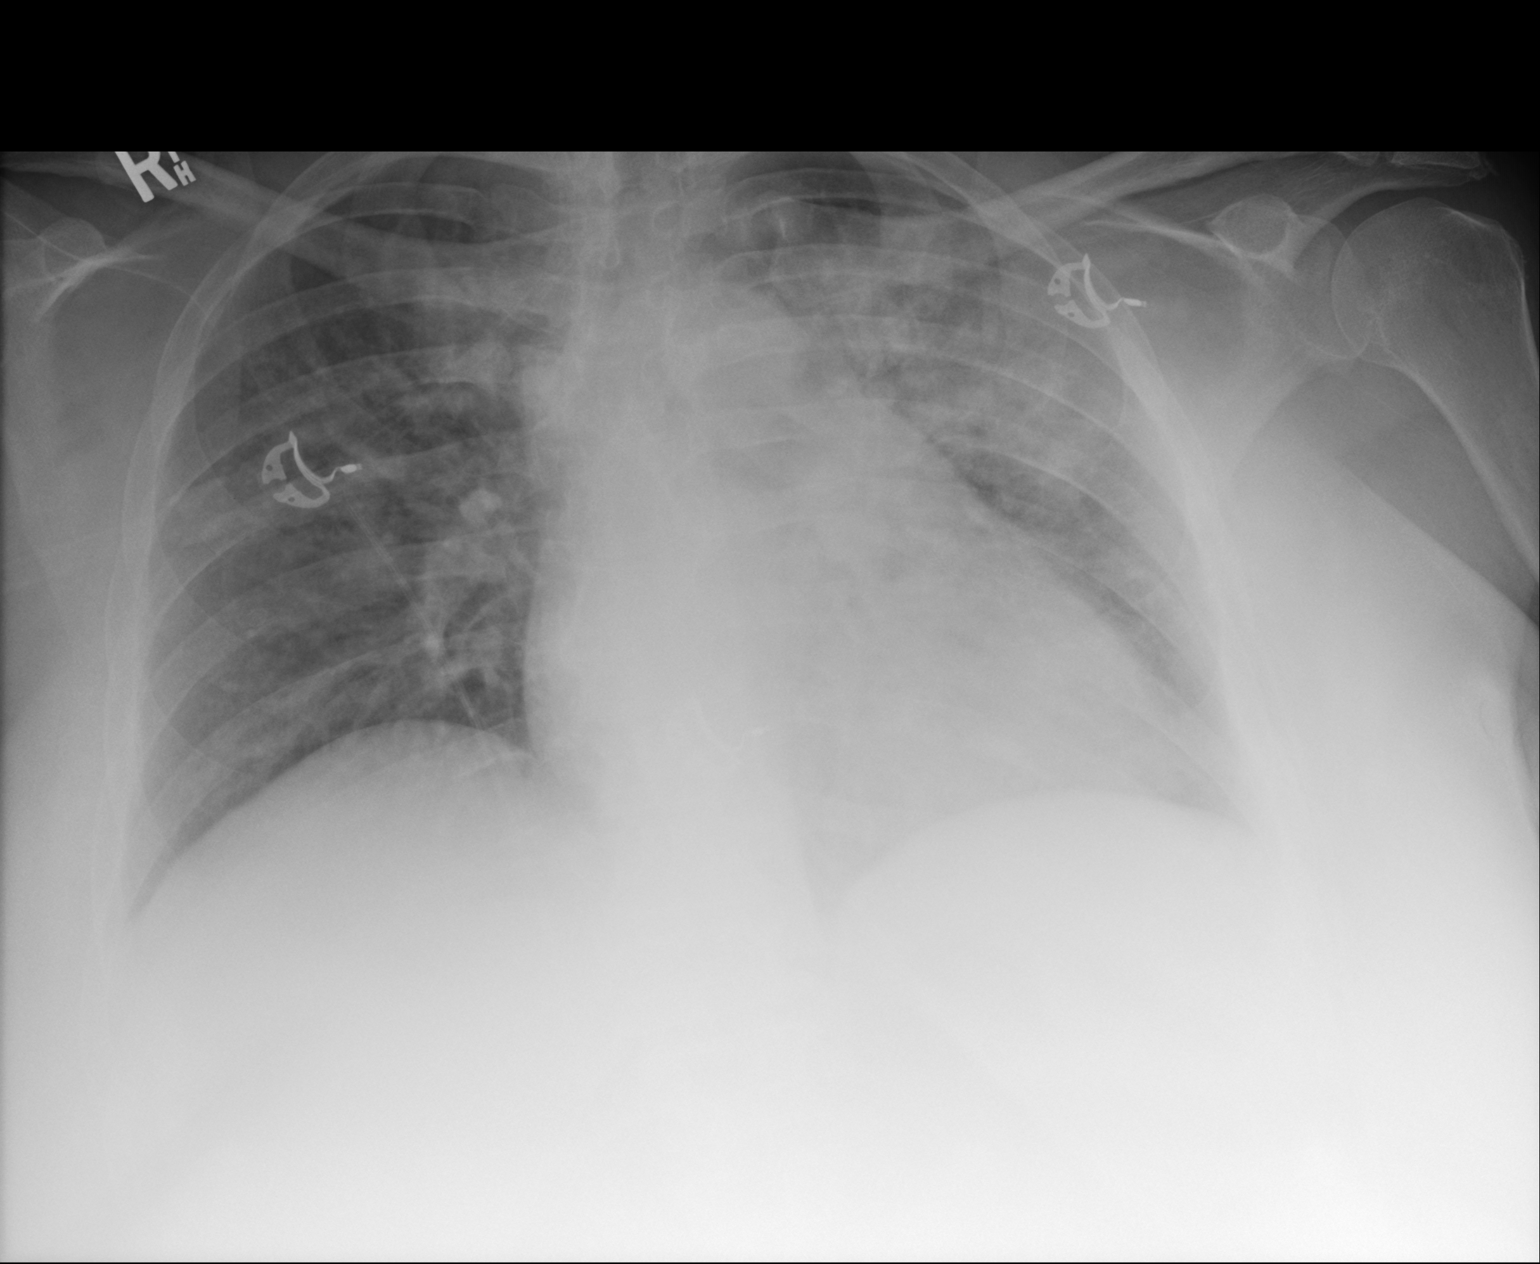

[1 of 1 positions shown; findings below may reference images not displayed]

FINDINGS: Shallow inspiration. Cardiac enlargement. Airspace disease in the
left upper lung likely represent pneumonia. Asymmetrical edema would
be possible but less likely. No blunting of costophrenic angles. No
pneumothorax. Mediastinal contours appear intact.
IMPRESSION: Cardiac enlargement. Airspace disease in the left upper lung likely
representing pneumonia.

## 2017-11-07 MED ORDER — HEPARIN 1000 UNIT/ML FOR PERITONEAL DIALYSIS
INTRAMUSCULAR | Status: DC | PRN
  Filled 2017-11-07: qty 5000

## 2017-11-07 MED ORDER — FUROSEMIDE 10 MG/ML IJ SOLN
80.0000 mg | Freq: Once | INTRAMUSCULAR | Status: AC
  Administered 2017-11-07: 80 mg via INTRAVENOUS
  Filled 2017-11-07: qty 8

## 2017-11-07 MED ORDER — ONDANSETRON HCL 4 MG/2ML IJ SOLN: 4.0000 mg | Freq: Four times a day (QID) | INTRAMUSCULAR | Status: DC | PRN

## 2017-11-07 MED ORDER — GENTAMICIN SULFATE 0.1 % EX CREA
1.0000 "application " | TOPICAL_CREAM | Freq: Every day | CUTANEOUS | Status: DC
  Administered 2017-11-08: 1 via TOPICAL
  Filled 2017-11-07: qty 15

## 2017-11-07 MED ORDER — ACETAMINOPHEN 325 MG PO TABS
650.0000 mg | ORAL_TABLET | Freq: Four times a day (QID) | ORAL | Status: DC | PRN
  Administered 2017-11-07 – 2017-11-08 (×2): 650 mg via ORAL
  Filled 2017-11-07 (×2): qty 2

## 2017-11-07 MED ORDER — ACETAMINOPHEN 650 MG RE SUPP: 650.0000 mg | Freq: Four times a day (QID) | RECTAL | Status: DC | PRN

## 2017-11-07 MED ORDER — NITROGLYCERIN 2 % TD OINT
1.0000 [in_us] | TOPICAL_OINTMENT | Freq: Once | TRANSDERMAL | Status: AC
  Administered 2017-11-07: 1 [in_us] via TOPICAL
  Filled 2017-11-07: qty 1

## 2017-11-07 MED ORDER — IPRATROPIUM-ALBUTEROL 0.5-2.5 (3) MG/3ML IN SOLN
3.0000 mL | Freq: Once | RESPIRATORY_TRACT | Status: AC
  Administered 2017-11-07: 3 mL via RESPIRATORY_TRACT
  Filled 2017-11-07: qty 3

## 2017-11-07 MED ORDER — DELFLEX-LC/4.25% DEXTROSE 483 MOSM/L IP SOLN: INTRAPERITONEAL | Status: DC

## 2017-11-07 MED ORDER — ONDANSETRON HCL 4 MG PO TABS: 4.0000 mg | ORAL_TABLET | Freq: Four times a day (QID) | ORAL | Status: DC | PRN

## 2017-11-07 MED ORDER — DELFLEX-LC/2.5% DEXTROSE 394 MOSM/L IP SOLN: INTRAPERITONEAL | Status: DC

## 2017-11-07 MED ORDER — SODIUM CHLORIDE 0.9% IV SOLUTION
Freq: Once | INTRAVENOUS | Status: AC
  Administered 2017-11-07: 13:00:00 via INTRAVENOUS

## 2017-11-07 MED ORDER — DARBEPOETIN ALFA 150 MCG/0.3ML IJ SOSY
150.0000 ug | PREFILLED_SYRINGE | INTRAMUSCULAR | Status: DC
  Administered 2017-11-08: 150 ug via SUBCUTANEOUS
  Filled 2017-11-07: qty 0.3

## 2017-11-07 MED ORDER — HEPARIN 1000 UNIT/ML FOR PERITONEAL DIALYSIS: 500.0000 [IU] | INTRAMUSCULAR | Status: DC | PRN

## 2017-11-07 MED ORDER — HEPARIN 1000 UNIT/ML FOR PERITONEAL DIALYSIS
INTRAPERITONEAL | Status: DC | PRN
  Filled 2017-11-07: qty 5000

## 2017-11-07 MED ORDER — EXTRANEAL 7.5 % IP SOLN
INTRAPERITONEAL | Status: DC
  Filled 2017-11-07 (×3): qty 2500

## 2017-11-07 NOTE — Progress Notes (Signed)
Patient has bed available, 5M07-01 at [facility]. [facility] called for transport.

## 2017-11-07 NOTE — Consult Note (Signed)
Reason for Consult: To manage dialysis and dialysis related needs Referring Physician: Alyzabeth Pontillo Janet Mitchell is an 52 y.o. female with past medical history significant for obesity, hypertension, diabetes mellitus and also a family history of kidney disease.  She started peritoneal dialysis in 2017 in [place].  She is on her original PD catheter, has never had peritonitis.  She has [place] recently [name] and established with Dr. Lowanda Foster.  She had a routine labs done by the outpatient kidney center and was reported that her hemoglobin was 5.  She tells me that she has been transfused 3 units packed red blood cells.  Hemoglobin at 1040 this morning was 7.1.  She denies any obvious bleeding or stool symptoms but is still having periods.  She did her peritoneal dialysis on Sunday evening but did not do it on Saturday evening because she was traveling.  She did not do it last night because she was in the hospital.  She now feels volume overloaded   Dialyzes at [place] unsure. She uses 2.5% dialysate.  Does 4 cycles and then has a last bag fill of extraneal.  Her dwell is 2.5 L at night and 2 L during the day.  She has used 4.25% fluid in the past when she has felt like this  Past Medical History:  Diagnosis Date  . CHF (congestive heart failure) ([place])   . Diabetes mellitus without complication ([place])   . Hypertension   . Renal disorder     Past Surgical History:  Procedure Laterality Date  . AMPUTATION TOE Left    Great left toe 2013.  Marland Kitchen CESAREAN SECTION     x2. 1994 and 1998  . TOTAL HIP ARTHROPLASTY Left 1997    Family History  Problem Relation Age of Onset  . Heart disease Mother   . Thrombocytopenia Mother        TTP  . Heart failure Father   . Kidney disease Paternal Grandfather     Social History:  reports that she has quit smoking. She has never used smokeless tobacco. She reports that she drank alcohol. She reports that she has current or  past drug history.  Allergies: No Known Allergies  Medications: I have reviewed the patient's current medications. She takes calcitriol 0.5 mcg on Mondays through Fridays and 1 mcg on Saturday and Sunday.  She does not report any binder use or Sensipar use  Results for orders placed or performed during the hospital encounter of 11/06/17 (from the past 48 hour(s))  CBC with Differential     Status: Abnormal   Collection Time: 11/06/17  8:29 PM  Result Value Ref Range   WBC 9.4 4.0 - 10.5 K/uL   RBC 2.71 (L) 3.87 - 5.11 MIL/uL   Hemoglobin 6.2 (LL) 12.0 - 15.0 g/dL    Comment: REPEATED TO VERIFY CRITICAL RESULT CALLED TO, READ BACK BY AND VERIFIED WITH: NICHOLS,K. AT 2056 ON 11/06/2017 BY EVA    HCT 20.3 (L) 36.0 - 46.0 %   MCV 74.9 (L) 78.0 - 100.0 fL   MCH 22.9 (L) 26.0 - 34.0 pg   MCHC 30.5 30.0 - 36.0 g/dL   RDW 19.1 (H) 11.5 - 15.5 %   Platelets 188 150 - 400 K/uL    Comment: SPECIMEN CHECKED FOR CLOTS PLATELET COUNT CONFIRMED BY SMEAR    Neutrophils Relative % 67 %   Lymphocytes Relative 18 %   Monocytes Relative 12 %   Eosinophils Relative  2 %   Basophils Relative 1 %   Other 0 %   Neutro Abs 6.3 1.7 - 7.7 K/uL   Lymphs Abs 1.7 0.7 - 4.0 K/uL   Monocytes Absolute 1.1 (H) 0.1 - 1.0 K/uL   Eosinophils Absolute 0.2 0.0 - 0.7 K/uL   Basophils Absolute 0.1 0.0 - 0.1 K/uL   RBC Morphology POLYCHROMASIA PRESENT     Comment: ELLIPTOCYTES Performed at [facility], [address], [place], [place] [address]   Basic metabolic panel     Status: Abnormal   Collection Time: 11/06/17  8:29 PM  Result Value Ref Range   Sodium 133 (L) 135 - 145 mmol/L   Potassium 4.3 3.5 - 5.1 mmol/L   Chloride 97 (L) 98 - 111 mmol/L   CO2 23 22 - 32 mmol/L   Glucose, Bld 163 (H) 70 - 99 mg/dL   BUN 90 (H) 6 - 20 mg/dL   Creatinine, Ser 13.93 (H) 0.44 - 1.00 mg/dL   Calcium 8.2 (L) 8.9 - 10.3 mg/dL   GFR calc non Af Amer 3 (L) >60 mL/min   GFR calc Af Amer 3 (L) >60 mL/min    Comment:  (NOTE) The eGFR has been calculated using the CKD EPI equation. This calculation has not been validated in all clinical situations. eGFR's persistently <60 mL/min signify possible Chronic Kidney Disease.    Anion gap 13 5 - 15    Comment: Performed at [facility], [address], [place], [place] [address]  Type and screen [facility]     Status: None (Preliminary result)   Collection Time: 11/06/17  8:29 PM  Result Value Ref Range   ABO/RH(D) A POS    Antibody Screen NEG    Sample Expiration 11/09/2017    Unit Number [id number]    Blood Component Type RED CELLS,LR    Unit division 00    Status of Unit ISSUED    Transfusion Status OK TO TRANSFUSE    Crossmatch Result Compatible    Unit Number [id number]    Blood Component Type RED CELLS,LR    Unit division 00    Status of Unit ISSUED,FINAL    Transfusion Status OK TO TRANSFUSE    Crossmatch Result Compatible    Unit Number [id number]    Blood Component Type RED CELLS,LR    Unit division 00    Status of Unit ALLOCATED    Transfusion Status OK TO TRANSFUSE    Crossmatch Result Compatible    Unit Number [id number]    Blood Component Type RED CELLS,LR    Unit division 00    Status of Unit ISSUED    Transfusion Status OK TO TRANSFUSE    Crossmatch Result      Compatible Performed at [facility], [address] [place], [place] [address]   Prepare RBC     Status: None   Collection Time: 11/06/17  8:29 PM  Result Value Ref Range   Order Confirmation      ORDER PROCESSED BY BLOOD BANK Performed at [facility], [address], [place], [place] [address]   ABO/Rh     Status: None   Collection Time: 11/06/17  8:29 PM  Result Value Ref Range   ABO/RH(D)      A POS Performed at [facility], [address], [place], [place] [address]   Prepare RBC     Status: None   Collection Time: 11/06/17  8:29 PM  Result Value Ref Range   Order  Confirmation      ORDER PROCESSED BY BLOOD BANK Performed at [facility], [address] [place], [place] [address]   CBC WITH DIFFERENTIAL     Status: Abnormal   Collection Time: 11/07/17 10:40 AM  Result Value Ref Range   WBC 8.7 4.0 - 10.5 K/uL   RBC 2.91 (L) 3.87 - 5.11 MIL/uL   Hemoglobin 7.1 (L) 12.0 - 15.0 g/dL   HCT 22.2 (L) 36.0 - 46.0 %   MCV 76.3 (L) 78.0 - 100.0 fL   MCH 24.4 (L) 26.0 - 34.0 pg   MCHC 32.0 30.0 - 36.0 g/dL   RDW 17.9 (H) 11.5 - 15.5 %   Platelets 151 150 - 400 K/uL    Comment: PLATELET COUNT CONFIRMED BY SMEAR SPECIMEN CHECKED FOR CLOTS    Neutrophils Relative % 70 %   Neutro Abs 6.1 1.7 - 7.7 K/uL   Lymphocytes Relative 14 %   Lymphs Abs 1.2 0.7 - 4.0 K/uL   Monocytes Relative 13 %   Monocytes Absolute 1.1 (H) 0.1 - 1.0 K/uL   Eosinophils Relative 2 %   Eosinophils Absolute 0.2 0.0 - 0.7 K/uL   Basophils Relative 1 %   Basophils Absolute 0.1 0.0 - 0.1 K/uL    Comment: Performed at [facility], [address] [place], [place] [address]  Basic metabolic panel     Status: Abnormal   Collection Time: 11/07/17 10:40 AM  Result Value Ref Range   Sodium 130 (L) 135 - 145 mmol/L   Potassium 4.5 3.5 - 5.1 mmol/L   Chloride 96 (L) 98 - 111 mmol/L   CO2 22 22 - 32 mmol/L   Glucose, Bld 111 (H) 70 - 99 mg/dL   BUN 89 (H) 6 - 20 mg/dL   Creatinine, Ser 14.63 (H) 0.44 - 1.00 mg/dL   Calcium 7.8 (L) 8.9 - 10.3 mg/dL   GFR calc non Af Amer 3 (L) >60 mL/min   GFR calc Af Amer 3 (L) >60 mL/min    Comment: (NOTE) The eGFR has been calculated using the CKD EPI equation. This calculation has not been validated in all clinical situations. eGFR's persistently <60 mL/min signify possible Chronic Kidney Disease.    Anion gap 12 5 - 15    Comment: Performed at [facility], [address], [place], [place] [address]  Glucose, capillary     Status: None   Collection Time: 11/07/17  6:02 PM  Result Value Ref Range   Glucose-Capillary 84 70 - 99 mg/dL    Dg Chest Port 1 View  Result Date: 11/07/2017 CLINICAL DATA:   Shortness of breath this morning EXAM: PORTABLE CHEST 1 VIEW COMPARISON:  None. FINDINGS: Shallow inspiration. Cardiac enlargement. Airspace disease in the left upper lung likely represent pneumonia. Asymmetrical edema would be possible but less likely. No blunting of costophrenic angles. No pneumothorax. Mediastinal contours appear intact. IMPRESSION: Cardiac enlargement. Airspace disease in the left upper lung likely representing pneumonia. Electronically Signed   By: Lucienne Capers M.D.   On: 11/07/2017 05:01    ROS: Positive for shortness of breath-lower extremity edema.  Otherwise negative Blood pressure (!) 157/92, pulse 69, temperature 98.4 F (36.9 C), temperature source Oral, resp. rate 18, height '5\' 8"'  (1.727 m), weight 122.5 kg, last menstrual period 10/30/2017, SpO2 97 %. General appearance: alert and no distress Resp: diminished breath sounds bibasilar Cardio: regular rate and rhythm, S1, S2 normal, no murmur, click, rub or gallop GI: soft, non-tender; bowel sounds normal;  no masses,  no organomegaly and PD catheter in the right lower quadrant-nontender-some mucousy type discharge Extremities: edema 2+ PD catheter-nontender  Assessment/Plan: 52 year old black female with ESRD on PD.  She was sent to the emergency department for low hemoglobin for blood transfusion.  She now is volume overloaded in need of ultrafiltration 1 anemia-status post blood transfusion.  Unclear what previous values were.  Unclear if she was on ESA.  Denies overt bleeding but is still having periods.  It seems hemoglobin has bumped appropriately.  To be managed by primary service 2 ESRD: On PD for 2 years.  Seems to be fairly compliant with treatments.  Will put on regular regimen tonight but increase the tonicity of the fluid in order to achieve ultrafiltration.  Will use more 4.25 fluids on an as-needed basis 3 Hypertension: Volume overloaded at this time.  Also noted to be on Toprol and Norvasc.   Ultrafiltration as able with PD tonight 4. Anemia of ESRD: Status post transfusion as above.  We will add a dose of ESA 5. Metabolic Bone Disease: Continue calcitriol-monitor labs as needed and act as necessary   Janet Mitchell A 11/07/2017, 7:26 PM

## 2017-11-07 NOTE — Progress Notes (Signed)
PROGRESS NOTE    Janet Mitchell  XIP:[id number] DOB: [birth year] DOA: 11/06/2017 PCP: Lucianne Lei, MD     Brief Narrative:  52 year old woman admitted from home on 8/12 due to a low hemoglobin.  She was sent here from the outpatient setting.  She has end-stage renal disease on peritoneal dialysis, hypertension, type 2 diabetes.  She did complain of increased weakness, fatigue and dyspnea particularly on exertion.  Admission was requested.   Assessment & Plan:   Principal Problem:   Symptomatic anemia Active Problems:   Hypertension   ESRD on peritoneal dialysis ([place])   Symptomatic anemia/anemia of chronic disease due to end-stage renal failure. -Presenting hemoglobin was 6.2. -After 2 units transfused hemoglobin remains low at 7.1 and I believe she needs further transfusion, 2 additional units have been ordered.  Acute hypoxemic respiratory failure/end-stage renal disease on peritoneal dialysis -Suspect due to volume overload as this happened while she was being transfused. -She has new oxygen requirement of 3 L, unable to wean down today. -Due to end-stage renal disease state on peritoneal dialysis and inability to perform PD while at [facility], will transfer to [facility] today.  I have consulted Dr. Detterding who agrees to assist with PD needs.  Hypertension -Systolic blood pressures in the 1 40-1 50 range. -Continue Norvasc, Lasix, metoprolol, should improve with reinitiation of dialysis.   DVT prophylaxis: SCDs Code Status: Full code Family Communication: Husband at bedside updated on plan of care and all questions answered Disposition Plan: Transfer to [facility] today  Consultants:   Nephrology, Dr. Detterding  Procedures:   None  Antimicrobials:  Anti-infectives (From admission, onward)   None       Subjective: Sitting upright in bed, increased respiratory effort, complains of shortness of breath  Objective: Vitals:   11/07/17 0456  11/07/17 0538 11/07/17 1324 11/07/17 1415  BP:  (!) 141/71 (!) 153/86 140/73  Pulse:  77 73 73  Resp:  18 16 18   Temp:  98.6 F (37 C) 98.6 F (37 C) 98 F (36.7 C)  TempSrc:  Oral Oral Oral  SpO2: 100% 98% 98% 92%  Weight:      Height:        Intake/Output Summary (Last 24 hours) at 11/07/2017 1519 Last data filed at 11/07/2017 0845 Gross per 24 hour  Intake 1163.75 ml  Output -  Net 1163.75 ml   Filed Weights   11/06/17 1951  Weight: 122.5 kg    Examination:  General exam: Alert, awake, oriented x 3 Respiratory system: Mildly increased respiratory effort, mild basilar crackles. Cardiovascular system:RRR. No murmurs, rubs, gallops. Gastrointestinal system: Abdomen is nondistended, soft and nontender. No organomegaly or masses felt. Normal bowel sounds heard. Central nervous system: Alert and oriented. No focal neurological deficits. Extremities: No C/C/E, +pedal pulses Skin: No rashes, lesions or ulcers Psychiatry: Judgement and insight appear normal. Mood & affect appropriate.     Data Reviewed: I have personally reviewed following labs and imaging studies  CBC: Recent Labs  Lab 11/06/17 2029 11/07/17 1040  WBC 9.4 8.7  NEUTROABS 6.3 6.1  HGB 6.2* 7.1*  HCT 20.3* 22.2*  MCV 74.9* 76.3*  PLT 188 673   Basic Metabolic Panel: Recent Labs  Lab 11/06/17 2029 11/07/17 1040  NA 133* 130*  K 4.3 4.5  CL 97* 96*  CO2 23 22  GLUCOSE 163* 111*  BUN 90* 89*  CREATININE 13.93* 14.63*  CALCIUM 8.2* 7.8*   GFR: Estimated Creatinine Clearance: 6.4 mL/min (A) (by  C-G formula based on SCr of 14.63 mg/dL (H)). Liver Function Tests: No results for input(s): AST, ALT, ALKPHOS, BILITOT, PROT, ALBUMIN in the last 168 hours. No results for input(s): LIPASE, AMYLASE in the last 168 hours. No results for input(s): AMMONIA in the last 168 hours. Coagulation Profile: No results for input(s): INR, PROTIME in the last 168 hours. Cardiac Enzymes: No results for input(s):  CKTOTAL, CKMB, CKMBINDEX, TROPONINI in the last 168 hours. BNP (last 3 results) No results for input(s): PROBNP in the last 8760 hours. HbA1C: No results for input(s): HGBA1C in the last 72 hours. CBG: No results for input(s): GLUCAP in the last 168 hours. Lipid Profile: No results for input(s): CHOL, HDL, LDLCALC, TRIG, CHOLHDL, LDLDIRECT in the last 72 hours. Thyroid Function Tests: No results for input(s): TSH, T4TOTAL, FREET4, T3FREE, THYROIDAB in the last 72 hours. Anemia Panel: No results for input(s): VITAMINB12, FOLATE, FERRITIN, TIBC, IRON, RETICCTPCT in the last 72 hours. Urine analysis: No results found for: COLORURINE, APPEARANCEUR, LABSPEC, PHURINE, GLUCOSEU, HGBUR, BILIRUBINUR, KETONESUR, PROTEINUR, UROBILINOGEN, NITRITE, LEUKOCYTESUR Sepsis Labs: @LABRCNTIP (procalcitonin:4,lacticidven:4)  )No results found for this or any previous visit (from the past 240 hour(s)).       Radiology Studies: Dg Chest Port 1 View  Result Date: 11/07/2017 CLINICAL DATA:  Shortness of breath this morning EXAM: PORTABLE CHEST 1 VIEW COMPARISON:  None. FINDINGS: Shallow inspiration. Cardiac enlargement. Airspace disease in the left upper lung likely represent pneumonia. Asymmetrical edema would be possible but less likely. No blunting of costophrenic angles. No pneumothorax. Mediastinal contours appear intact. IMPRESSION: Cardiac enlargement. Airspace disease in the left upper lung likely representing pneumonia. Electronically Signed   By: Lucienne Capers M.D.   On: 11/07/2017 05:01        Scheduled Meds: . amLODipine  10 mg Oral QHS  . aspirin EC  81 mg Oral QHS  . calcitRIOL  0.5 mcg Oral Once per day on Mon Tue Wed Thu Fri  . [START ON 11/11/2017] calcitRIOL  1 mcg Oral Once per day on Sun Sat  . insulin glargine  14 Units Subcutaneous QHS  . metoprolol succinate  100 mg Oral QHS   Continuous Infusions:   LOS: 0 days    Time spent: 35 minutes. Greater than 50% of this time  was spent in direct contact with the patient and with patient's husband, coordinating care and discussing relevant ongoing clinical issues, including her ongoing anemia and need for further PRBC transfusion and need to transfer to [facility] as [facility] is not equipped to handle peritoneal dialysis.Lelon Frohlich, MD [facility] Pager [phone]  If 7PM-7AM, please contact night-coverage [email or web address] Password [facility] 11/07/2017, 3:19 PM

## 2017-11-07 NOTE — Progress Notes (Signed)
Patient ID: Janet Mitchell, female   DOB: [birth year], 52 y.o.   MRN: [id number]   The patient was seen due to dyspnea after first unit of PRBC was transfused.  Supplemental oxygen, furosemide 80 mg IVP second dose, Nitropaste chest wall and morphine 2 mg IVP.  The patient stated she felt better.  EKG normal sinus rhythm.  Will continue to monitor.  Tennis Must, MD

## 2017-11-07 NOTE — Progress Notes (Signed)
Report called to [facility], Chester Holstein RN Phone number:[phone]

## 2017-11-07 NOTE — Progress Notes (Addendum)
[facility] is here to transfer patient to [facility], Blood hanging, Blood consent sent with [facility] with transfer packet.

## 2017-11-08 DIAGNOSIS — D649 Anemia, unspecified: Principal | ICD-10-CM

## 2017-11-08 LAB — MAGNESIUM: MAGNESIUM: 2 mg/dL (ref 1.7–2.4)

## 2017-11-08 LAB — BASIC METABOLIC PANEL
Anion gap: 17 — ABNORMAL HIGH (ref 5–15)
BUN: 81 mg/dL — AB (ref 6–20)
CHLORIDE: 95 mmol/L — AB (ref 98–111)
CO2: 20 mmol/L — AB (ref 22–32)
Calcium: 8.2 mg/dL — ABNORMAL LOW (ref 8.9–10.3)
Creatinine, Ser: 14.12 mg/dL — ABNORMAL HIGH (ref 0.44–1.00)
GFR calc Af Amer: 3 mL/min — ABNORMAL LOW (ref >60)
GFR calc non Af Amer: 3 mL/min — ABNORMAL LOW (ref >60)
GLUCOSE: 157 mg/dL — AB (ref 70–99)
POTASSIUM: 4.1 mmol/L (ref 3.5–5.1)
Sodium: 132 mmol/L — ABNORMAL LOW (ref 135–145)

## 2017-11-08 LAB — CBC
HEMATOCRIT: 26.2 % — AB (ref 36.0–46.0)
HEMOGLOBIN: 8.5 g/dL — AB (ref 12.0–15.0)
MCH: 25.2 pg — AB (ref 26.0–34.0)
MCHC: 32.4 g/dL (ref 30.0–36.0)
MCV: 77.7 fL — AB (ref 78.0–100.0)
Platelets: 183 10*3/uL (ref 150–400)
RBC: 3.37 MIL/uL — AB (ref 3.87–5.11)
RDW: 17.7 % — ABNORMAL HIGH (ref 11.5–15.5)
WBC: 10.4 10*3/uL (ref 4.0–10.5)

## 2017-11-08 LAB — HIV ANTIBODY (ROUTINE TESTING W REFLEX): HIV SCREEN 4TH GENERATION: NONREACTIVE

## 2017-11-08 LAB — GLUCOSE, CAPILLARY: Glucose-Capillary: 244 mg/dL — ABNORMAL HIGH (ref 70–99)

## 2017-11-08 MED ORDER — RENA-VITE PO TABS
1.0000 | ORAL_TABLET | Freq: Every day | ORAL | Status: DC
  Administered 2017-11-08: 1 via ORAL
  Filled 2017-11-08: qty 1

## 2017-11-08 NOTE — Progress Notes (Signed)
Patient Demographics:    Janet Mitchell, is a 52 y.o. female, DOB - 09-26-1965, YQM:[id number]  Admit date - 11/06/2017   Admitting Physician Erline Hau, MD  Outpatient Primary MD for the patient is Lucianne Lei, MD  LOS - 1   Chief Complaint  Patient presents with  . Abnormal Lab        Subjective:    [name] today has no fevers, no emesis,  No chest pain, she feels better,  Assessment  & Plan :    Principal Problem:   Symptomatic anemia Active Problems:   Hypertension   ESRD on peritoneal dialysis [facility])  Brief summary 52 year old female with ESRD and anemia of CKD as well as hypertension and diabetes admitted on 11/06/2017 as a direct admission from PCPs office with hemoglobin of 6.2 and symptoms consistent with acute on chronic anemia including dyspnea on exertion, fatigue and generalized weakness, transferred from [facility] due to lack of services for peritoneal dialysis patient.    Plan:-  1)Acute on chronic Anemia--patient is admitted with symptomatic anemia, hemoglobin up to 8.5 from 6.2 on admission after transfusion of 2 units of packed cells, at baseline patient has chronic anemia of CKD, monitor H&H closely, transfuse as clinically indicated, EPO/ESA agent per nephrology  2)ESRD--- peritoneal dialysis as per nephrology team  3)DM2-last A1c not available,  4)Morbid Obesity--- BMI over 41, this complicates overall care  5)HTN-mostly stable, continue metoprolol, Norvasc and Lasix  6) acute hypoxic respiratory failure----secondary to volume overload in an ESRD patient,  Code Status : Full    Disposition Plan  : home once volume overload status is improved  Consults  :  Nephrology   DVT Prophylaxis  :   SCDs    Lab Results  Component Value Date   PLT 183 11/08/2017    Inpatient Medications  Scheduled Meds: . amLODipine  10 mg Oral  QHS  . aspirin EC  81 mg Oral QHS  . calcitRIOL  0.5 mcg Oral Once per day on Mon Tue Wed Thu Fri  . [START ON 11/11/2017] calcitRIOL  1 mcg Oral Once per day on Sun Sat  . darbepoetin (ARANESP) injection - NON-DIALYSIS  150 mcg Subcutaneous Q Wed-1800  . extraneal (ICODEXTRIN) peritoneal dialysis solution   Intraperitoneal Q24H  . gentamicin cream  1 application Topical Daily  . insulin glargine  14 Units Subcutaneous QHS  . metoprolol succinate  100 mg Oral QHS  . multivitamin  1 tablet Oral QHS   Continuous Infusions: . dialysis solution 2.5% low-MG/low-CA    . dialysis solution 4.25% low-MG/low-CA     PRN Meds:.acetaminophen **OR** acetaminophen, dianeal solution for CAPD/CCPD with heparin, ondansetron **OR** ondansetron (ZOFRAN) IV    Anti-infectives (From admission, onward)   None        Objective:   Vitals:   11/08/17 0532 11/08/17 0818 11/08/17 0929 11/08/17 0930  BP: (!) 156/73 (!) 147/65 (!) 147/69   Pulse: 83 79 81   Resp: 18 19 19    Temp: 99.4 F (37.4 C) (!) 100.5 F (38.1 C) 99.9 F (37.7 C)   TempSrc: Oral Oral Oral   SpO2: (!) 89% 95% (!) 85%   Weight:    124.3 kg  Height:  Wt Readings from Last 3 Encounters:  11/08/17 124.3 kg     Intake/Output Summary (Last 24 hours) at 11/08/2017 1500 Last data filed at 11/08/2017 1330 Gross per 24 hour  Intake 12958 ml  Output 0 ml  Net 12958 ml     Physical Exam  Gen:- Awake Alert,  In no apparent distress  HEENT:- [place].AT, No sclera icterus Neck-Supple Neck,No JVD,.  Lungs-  CTAB , good air movement bilaterally CV- S1, S2 normal Abd-  +ve B.Sounds, Abd Soft, No tenderness, PD catheter site is clean dry and intact Extremity/Skin:-Skin is warm and dry, pulses are good Psych-affect is appropriate, oriented x3 Neuro-no new focal deficits, no tremors   Data Review:   Micro Results No results found for this or any previous visit (from the past 240 hour(s)).  Radiology Reports Dg Chest Port 1  View  Result Date: 11/07/2017 CLINICAL DATA:  Shortness of breath this morning EXAM: PORTABLE CHEST 1 VIEW COMPARISON:  None. FINDINGS: Shallow inspiration. Cardiac enlargement. Airspace disease in the left upper lung likely represent pneumonia. Asymmetrical edema would be possible but less likely. No blunting of costophrenic angles. No pneumothorax. Mediastinal contours appear intact. IMPRESSION: Cardiac enlargement. Airspace disease in the left upper lung likely representing pneumonia. Electronically Signed   By: Lucienne Capers M.D.   On: 11/07/2017 05:01     CBC Recent Labs  Lab 11/06/17 2029 11/07/17 1040 11/07/17 2247 11/08/17 0425  WBC 9.4 8.7 10.4 10.4  HGB 6.2* 7.1* 8.3* 8.5*  HCT 20.3* 22.2* 25.7* 26.2*  PLT 188 151 178 183  MCV 74.9* 76.3* 77.4* 77.7*  MCH 22.9* 24.4* 25.0* 25.2*  MCHC 30.5 32.0 32.3 32.4  RDW 19.1* 17.9* 17.6* 17.7*  LYMPHSABS 1.7 1.2  --   --   MONOABS 1.1* 1.1*  --   --   EOSABS 0.2 0.2  --   --   BASOSABS 0.1 0.1  --   --     Chemistries  Recent Labs  Lab 11/06/17 2029 11/07/17 1040 11/08/17 0425  NA 133* 130* 132*  K 4.3 4.5 4.1  CL 97* 96* 95*  CO2 23 22 20*  GLUCOSE 163* 111* 157*  BUN 90* 89* 81*  CREATININE 13.93* 14.63* 14.12*  CALCIUM 8.2* 7.8* 8.2*  MG  --   --  2.0   ------------------------------------------------------------------------------------------------------------------ No results for input(s): CHOL, HDL, LDLCALC, TRIG, CHOLHDL, LDLDIRECT in the last 72 hours.  No results found for: HGBA1C ------------------------------------------------------------------------------------------------------------------ No results for input(s): TSH, T4TOTAL, T3FREE, THYROIDAB in the last 72 hours.  Invalid input(s): FREET3 ------------------------------------------------------------------------------------------------------------------ No results for input(s): VITAMINB12, FOLATE, FERRITIN, TIBC, IRON, RETICCTPCT in the last 72  hours.  Coagulation profile No results for input(s): INR, PROTIME in the last 168 hours.  No results for input(s): DDIMER in the last 72 hours.  Cardiac Enzymes No results for input(s): CKMB, TROPONINI, MYOGLOBIN in the last 168 hours.  Invalid input(s): CK ------------------------------------------------------------------------------------------------------------------ No results found for: BNP   Roxan Hockey M.D on 11/08/2017 at 3:00 PM   Go to [email or web address] - password [email or web address] for contact info  [facility] - Office  [phone]

## 2017-11-08 NOTE — Progress Notes (Signed)
Patient was feeling SOB while receiving PD. O2 sat was 88 while on 2 Liters of oxygen [place]. I bumped up her O2 to 2.5 liters and O2 went up to 92% while on oxygen 2.5 liters [place]. Will continue to monitor.   Farley Ly RN

## 2017-11-08 NOTE — Progress Notes (Signed)
Subjective: Interval History: has no complaint feels better.  Objective: Vital signs in last 24 hours: Temp:  [98 F (36.7 C)-100.5 F (38.1 C)] 99.9 F (37.7 C) (08/14 0929) Pulse Rate:  [69-83] 81 (08/14 0929) Resp:  [16-19] 19 (08/14 0929) BP: (140-157)/(65-92) 147/69 (08/14 0929) SpO2:  [85 %-98 %] 85 % (08/14 0929) Weight:  [209 kg] 127 kg (08/13 2000) Weight change: 4.529 kg  Intake/Output from previous day: 08/13 0701 - 08/14 0700 In: 1362.3 [P.O.:480; I.V.:26; Blood:856.3] Out: 0  Intake/Output this shift: No intake/output data recorded.  General appearance: alert, cooperative, moderately obese and edematous Resp: diminished breath sounds bilaterally and rales bibasilar Cardio: S1, S2 normal and systolic murmur: holosystolic 2/6, blowing at apex GI: pos bs, pos FW, soft, PD cath lower R abdm, ES ok Extremities: edema 2+  Lab Results: Recent Labs    11/07/17 2247 11/08/17 0425  WBC 10.4 10.4  HGB 8.3* 8.5*  HCT 25.7* 26.2*  PLT 178 183   BMET:  Recent Labs    11/07/17 1040 11/08/17 0425  NA 130* 132*  K 4.5 4.1  CL 96* 95*  CO2 22 20*  GLUCOSE 111* 157*  BUN 89* 81*  CREATININE 14.63* 14.12*  CALCIUM 7.8* 8.2*   No results for input(s): PTH in the last 72 hours. Iron Studies: No results for input(s): IRON, TIBC, TRANSFERRIN, FERRITIN in the last 72 hours.  Studies/Results: Dg Chest Port 1 View  Result Date: 11/07/2017 CLINICAL DATA:  Shortness of breath this morning EXAM: PORTABLE CHEST 1 VIEW COMPARISON:  None. FINDINGS: Shallow inspiration. Cardiac enlargement. Airspace disease in the left upper lung likely represent pneumonia. Asymmetrical edema would be possible but less likely. No blunting of costophrenic angles. No pneumothorax. Mediastinal contours appear intact. IMPRESSION: Cardiac enlargement. Airspace disease in the left upper lung likely representing pneumonia. Electronically Signed   By: Lucienne Capers M.D.   On: 11/07/2017 05:01    I  have reviewed the patient's current medications.  Assessment/Plan: 1 ESRD needs lower vol, and more clearance 2 HTN lower vol, cont meds, hopefully lower 3 Anemia stable, ? Source use esa, check Fe 4 Obesity 5 DM controlled 6 HPTH on vit D P PD add exchange, ^to all 4.25%, follow bps    LOS: 1 day   Lorelie Biermann 11/08/2017,9:39 AM

## 2017-11-09 DIAGNOSIS — Z992 Dependence on renal dialysis: Secondary | ICD-10-CM | POA: Diagnosis not present

## 2017-11-09 DIAGNOSIS — N186 End stage renal disease: Secondary | ICD-10-CM | POA: Diagnosis not present

## 2017-11-09 DIAGNOSIS — D509 Iron deficiency anemia, unspecified: Secondary | ICD-10-CM | POA: Diagnosis not present

## 2017-11-09 DIAGNOSIS — D631 Anemia in chronic kidney disease: Secondary | ICD-10-CM | POA: Diagnosis not present

## 2017-11-09 DIAGNOSIS — N2581 Secondary hyperparathyroidism of renal origin: Secondary | ICD-10-CM | POA: Diagnosis not present

## 2017-11-09 LAB — RENAL FUNCTION PANEL
ALBUMIN: 2.1 g/dL — AB (ref 3.5–5.0)
Anion gap: 14 (ref 5–15)
BUN: 72 mg/dL — AB (ref 6–20)
CHLORIDE: 95 mmol/L — AB (ref 98–111)
CO2: 23 mmol/L (ref 22–32)
Calcium: 8.1 mg/dL — ABNORMAL LOW (ref 8.9–10.3)
Creatinine, Ser: 13.18 mg/dL — ABNORMAL HIGH (ref 0.44–1.00)
GFR calc Af Amer: 3 mL/min — ABNORMAL LOW (ref >60)
GFR calc non Af Amer: 3 mL/min — ABNORMAL LOW (ref >60)
GLUCOSE: 190 mg/dL — AB (ref 70–99)
POTASSIUM: 3.5 mmol/L (ref 3.5–5.1)
Phosphorus: 6.8 mg/dL — ABNORMAL HIGH (ref 2.5–4.6)
Sodium: 132 mmol/L — ABNORMAL LOW (ref 135–145)

## 2017-11-09 LAB — CBC
HEMATOCRIT: 24.5 % — AB (ref 36.0–46.0)
Hemoglobin: 7.9 g/dL — ABNORMAL LOW (ref 12.0–15.0)
MCH: 25.4 pg — ABNORMAL LOW (ref 26.0–34.0)
MCHC: 32.2 g/dL (ref 30.0–36.0)
MCV: 78.8 fL (ref 78.0–100.0)
Platelets: 176 10*3/uL (ref 150–400)
RBC: 3.11 MIL/uL — ABNORMAL LOW (ref 3.87–5.11)
RDW: 17.7 % — AB (ref 11.5–15.5)
WBC: 9.4 10*3/uL (ref 4.0–10.5)

## 2017-11-09 MED ORDER — METOPROLOL SUCCINATE ER 100 MG PO TB24: 100.0000 mg | ORAL_TABLET | Freq: Every day | ORAL | 3 refills | Status: DC

## 2017-11-09 MED ORDER — RENA-VITE PO TABS: 1.0000 | ORAL_TABLET | Freq: Every day | ORAL | 3 refills | Status: DC

## 2017-11-09 MED ORDER — DARBEPOETIN ALFA 150 MCG/0.3ML IJ SOSY: 150.0000 ug | PREFILLED_SYRINGE | Freq: Once | INTRAMUSCULAR | Status: DC

## 2017-11-09 MED ORDER — TORSEMIDE 100 MG PO TABS: 100.0000 mg | ORAL_TABLET | Freq: Every day | ORAL | 3 refills | Status: DC

## 2017-11-09 MED ORDER — INSULIN GLARGINE 300 UNIT/ML ~~LOC~~ SOPN: 14.0000 [IU] | PEN_INJECTOR | Freq: Every day | SUBCUTANEOUS | 3 refills | Status: DC

## 2017-11-09 MED ORDER — AMLODIPINE BESYLATE 10 MG PO TABS: 10.0000 mg | ORAL_TABLET | Freq: Every day | ORAL | 3 refills | Status: DC

## 2017-11-09 NOTE — Discharge Summary (Signed)
Janet Mitchell, is a 52 y.o. female  DOB [birth year]  MRN [id number].  Admission date:  11/06/2017  Admitting Physician  Erline Hau, MD  Discharge Date:  11/09/2017   Primary MD  Lucianne Lei, MD  Recommendations for primary care physician for things to follow:    1) follow-up with nephrologist Dr. Lowanda Foster on Monday, 11/13/2017 in [place] 2) continue peritoneal dialysis every night as advised 3)Very low-salt diet advised 4)Weigh yourself daily, call if you gain more than 3 pounds in 1 day or more than 5 pounds in 1 week as your diuretic medications may need to be adjusted 5)Limit your Fluid  intake to no more than 60 ounces (1.8 Liters) per day   Admission Diagnosis  Chronic renal failure, stage 5 ([place]) [N18.5] Symptomatic anemia [D64.9]   Discharge Diagnosis  Chronic renal failure, stage 5 ([place]) [N18.5] Symptomatic anemia [D64.9]    Principal Problem:   Symptomatic anemia Active Problems:   Hypertension   ESRD on peritoneal dialysis [facility])      Past Medical History:  Diagnosis Date  . CHF (congestive heart failure) ([place])   . Diabetes mellitus without complication ([place])   . Hypertension   . Renal disorder     Past Surgical History:  Procedure Laterality Date  . AMPUTATION TOE Left    Great left toe 2013.  Marland Kitchen CESAREAN SECTION     x2. 1994 and 1998  . TOTAL HIP ARTHROPLASTY Left 1997       HPI  from the history and physical done on the day of admission:    Chief Complaint: Abnormal lab result.  HPI: Janet Mitchell is a 52 y.o. female with medical history significant of unspecified CHF, type 2 diabetes, hypertension, ESRD on peritoneal dialysis who is to the emergency department due to having a low hemoglobin level.  She complains of frequent tiredness, fatigue and dyspnea, particularly on exertion.  She denies chest pain, palpitations, diaphoresis, PND or  orthopnea.  She has perennial lower extremity edema.  She denies abdominal pain, nausea, emesis, diarrhea, constipation, melena or hematochezia.  No dysuria, frequency or hematuria.  No heat or cold intolerance.  Denies polyuria, polydipsia, polyphagia or blurred vision.  No skin rashes or pruritus.  ED Course: Temperature was 98.5 F, pulse 79, respirations 18, blood pressure 131/61 mmHg and O2 sat 94% on room air.  Her work-up shows a white count of 9.4, hemoglobin 6.2 g/dL and platelets 188.  Sodium 143, potassium 4.3, chloride 97 and CO2 23 mmol/L.  Her glucose of 163, BUN 90, creatinine 13.93 and calcium 8.2 mg/dL.  Her type and screen showed A+ blood type.    Hospital Course:    Brief summary 52 year old female with ESRD and anemia of CKD as well as hypertension and diabetes admitted on 11/06/2017 as a direct admission from PCPs office with hemoglobin of 6.2 and symptoms consistent with acute on chronic anemia including dyspnea on exertion, fatigue and generalized weakness, transferred from [facility] due to lack of services for peritoneal  dialysis patient.    Plan:-  1)Acute on chronic Anemia--patient is admitted with symptomatic anemia, hemoglobin up to 7.9 from 6.2 on admission after transfusion of 2 units of packed cells, at baseline patient has chronic anemia of CKD,    Recheck CBC on 11/13/2017, may need further transfusions, Aranesp given, further EPO/ESA agent per nephrology. no evidence of acute bleeding, LMP last week  2)ESRD--- peritoneal dialysis as per nephrology team, patient diuresed about 7 pounds overnight with adjustment of her peritoneal dialysis per nephrology (d/w Dr Dahlia Client),  feels better, shortness of breath has resolved, patient ambulated about 200 feet, post ambulation O2 sats 90 to 92% on room air         3)DM2-last A1c not available,  Resume Lantus insulin 14 units daily  4)Morbid Obesity--- BMI over 41, this complicates overall  care  5)HTN-mostly stable, continue metoprolol 100 mg daily, Norvasc 10 mg daily and Lasix  6) acute hypoxic respiratory failure----secondary to volume overload in an ESRD patient,  Resolved after adjustment of her peritoneal dialysis, shortness of breath has resolved, patient ambulated about 200 feet, post ambulation O2 sats 90 to 92% on room air          Code Status : Full    Disposition Plan  : home  Consults  :  Nephrology   DVT Prophylaxis  :   SCDs    Discharge Condition: stable  Follow UP- Dr. Hinda Lenis, Nephrology in [place] and Activity recommendation:  As advised  Discharge Instructions    Discharge Instructions    Call MD for:  difficulty breathing, headache or visual disturbances   Complete by:  As directed    Call MD for:  persistant dizziness or light-headedness   Complete by:  As directed    Call MD for:  persistant nausea and vomiting   Complete by:  As directed    Call MD for:  redness, tenderness, or signs of infection (pain, swelling, redness, odor or green/yellow discharge around incision site)   Complete by:  As directed    Call MD for:  temperature >100.4   Complete by:  As directed    Diet - low sodium heart healthy   Complete by:  As directed    Discharge instructions   Complete by:  As directed    1) follow-up with nephrologist Dr. Lowanda Foster on Monday, 11/13/2017 in [place] 2) continue peritoneal dialysis every night as advised 3)Very low-salt diet advised 4)Weigh yourself daily, call if you gain more than 3 pounds in 1 day or more than 5 pounds in 1 week as your diuretic medications may need to be adjusted 5)Limit your Fluid  intake to no more than 60 ounces (1.8 Liters) per day 6)Repeat CBC/complete blood count on Monday, 11/13/2017 with your nephrologist Dr. Lowanda Foster--- you may need transfusion of packed red blood cells if your numbers are low   Increase activity slowly   Complete by:  As directed         Discharge  Medications     Allergies as of 11/09/2017   No Known Allergies     Medication List    TAKE these medications   amLODipine 10 MG tablet Commonly known as:  NORVASC Take 1 tablet (10 mg total) by mouth daily. What changed:  when to take this   aspirin EC 81 MG tablet Take 81 mg by mouth at bedtime.   calcitRIOL 0.5 MCG capsule Commonly known as:  ROCALTROL Take 0.5-1 mcg by mouth See admin  instructions. 0.57mcg on Mondays through Fridays and take 36mcg on Saturdays and Sundays   Insulin Glargine 300 UNIT/ML Sopn Inject 14 Units into the skin at bedtime.   metoprolol succinate 100 MG 24 hr tablet Commonly known as:  TOPROL-XL Take 1 tablet (100 mg total) by mouth daily. Take with or immediately following a meal. What changed:  when to take this   multivitamin Tabs tablet Take 1 tablet by mouth at bedtime.   polyethylene glycol-electrolytes 420 g solution Commonly known as:  NuLYTELY/GoLYTELY Take 4,000 mLs by mouth as directed.   torsemide 100 MG tablet Commonly known as:  DEMADEX Take 1 tablet (100 mg total) by mouth at bedtime.       Major procedures and Radiology Reports - PLEASE review detailed and final reports for all details, in brief -    Dg Chest Port 1 View  Result Date: 11/07/2017 CLINICAL DATA:  Shortness of breath this morning EXAM: PORTABLE CHEST 1 VIEW COMPARISON:  None. FINDINGS: Shallow inspiration. Cardiac enlargement. Airspace disease in the left upper lung likely represent pneumonia. Asymmetrical edema would be possible but less likely. No blunting of costophrenic angles. No pneumothorax. Mediastinal contours appear intact. IMPRESSION: Cardiac enlargement. Airspace disease in the left upper lung likely representing pneumonia. Electronically Signed   By: Lucienne Capers M.D.   On: 11/07/2017 05:01    Micro Results   No results found for this or any previous visit (from the past 240 hour(s)).     Today   Subjective    Janet Mitchell  today has no new complaints, feels better, shortness of breath has resolved, patient ambulated about 200 feet, post ambulation O2 sats 90 to 92% on room air          Patient has been seen and examined prior to discharge   Objective   Blood pressure (!) 148/72, pulse 80, temperature 98.5 F (36.9 C), temperature source Oral, resp. rate 18, height 5\' 8"  (1.727 m), weight 124.8 kg, last menstrual period 10/30/2017, SpO2 96 %.   Intake/Output Summary (Last 24 hours) at 11/09/2017 0948 Last data filed at 11/09/2017 0938 Gross per 24 hour  Intake 12715 ml  Output 15336 ml  Net -2621 ml    Exam Gen:- Awake Alert,  In no apparent distress ,  HEENT:- [place].AT, No sclera icterus Neck-Supple Neck,No JVD,.  Lungs-  CTAB , good air movement bilaterally CV- S1, S2 normal Abd-  +ve B.Sounds, Abd Soft, No tenderness, PD catheter site is clean dry and intact Extremity/Skin:-Skin is warm and dry, pulses are good Psych-affect is appropriate, oriented x3 Neuro-no new focal deficits, no tremors   Data Review   CBC w Diff:  Lab Results  Component Value Date   WBC 9.4 11/09/2017   HGB 7.9 (L) 11/09/2017   HCT 24.5 (L) 11/09/2017   PLT 176 11/09/2017   LYMPHOPCT 14 11/07/2017   MONOPCT 13 11/07/2017   EOSPCT 2 11/07/2017   BASOPCT 1 11/07/2017    CMP:  Lab Results  Component Value Date   NA 132 (L) 11/09/2017   K 3.5 11/09/2017   CL 95 (L) 11/09/2017   CO2 23 11/09/2017   BUN 72 (H) 11/09/2017   CREATININE 13.18 (H) 11/09/2017   ALBUMIN 2.1 (L) 11/09/2017  .   Total Discharge time is about 33 minutes  Roxan Hockey M.D on 11/09/2017 at 9:48 AM   Go to [email or web address] - password [email or web address] for contact info  [facility] - Office  [phone]

## 2017-11-09 NOTE — Progress Notes (Signed)
Subjective: Interval History: has no complaint , feels a lot better..  Objective: Vital signs in last 24 hours: Temp:  [98.5 F (36.9 C)-99.9 F (37.7 C)] 98.5 F (36.9 C) (08/15 0829) Pulse Rate:  [73-82] 73 (08/15 0829) Resp:  [17-19] 18 (08/15 0829) BP: (136-163)/(55-92) 148/72 (08/15 0829) SpO2:  [85 %-96 %] 92 % (08/15 0829) Weight:  [124.3 kg-124.8 kg] 124.8 kg (08/14 1702) Weight change: -2.7 kg  Intake/Output from previous day: 08/14 0701 - 08/15 0700 In: 62952 [P.O.:720] Out: 15336  Intake/Output this shift: No intake/output data recorded.  General appearance: alert, cooperative, no distress and moderately obese Resp: diminished breath sounds bilaterally Cardio: S1, S2 normal and systolic murmur: holosystolic 2/6, blowing at apex GI: obese, pos bs, pos FW,  PD eS R lower abdm Extremities: edema 1+  Lab Results: Recent Labs    11/08/17 0425 11/09/17 0313  WBC 10.4 9.4  HGB 8.5* 7.9*  HCT 26.2* 24.5*  PLT 183 176   BMET:  Recent Labs    11/08/17 0425 11/09/17 0313  NA 132* 132*  K 4.1 3.5  CL 95* 95*  CO2 20* 23  GLUCOSE 157* 190*  BUN 81* 72*  CREATININE 14.12* 13.18*  CALCIUM 8.2* 8.1*   No results for input(s): PTH in the last 72 hours. Iron Studies: No results for input(s): IRON, TIBC, TRANSFERRIN, FERRITIN in the last 72 hours.  Studies/Results: No results found.  I have reviewed the patient's current medications.  Assessment/Plan: 1 ESRD UF ok. Vol xs improving, still xs.  Will cont 4.25%.  Counseled 2 anemia Hb variable cont esa, check Fe 3 HPTH vit D 4 DM controlled 5 Obesity P 4.25, counseled, follow Hb, can d/c anytime from our standpoint    LOS: 2 days   Jeneen Rinks Jadier Rockers 11/09/2017,9:20 AM

## 2017-11-09 NOTE — Discharge Instructions (Signed)
1) follow-up with nephrologist Dr. Lowanda Foster on Monday, 11/13/2017 in [place] 2) continue peritoneal dialysis every night as advised 3)Very low-salt diet advised 4)Weigh yourself daily, call if you gain more than 3 pounds in 1 day or more than 5 pounds in 1 week as your diuretic medications may need to be adjusted 5)Limit your Fluid  intake to no more than 60 ounces (1.8 Liters) per day

## 2017-11-09 NOTE — Progress Notes (Signed)
Patient discharged to home. Patient AVS reviewed and signed. Patient capable re-verbalizing medications and follow-up appointments. IV removed. Patient belongings sent with patient. Patient educated to return to the ED in the event of SOB, chest pain or dizziness.   Verlisa Vara B. RN 

## 2017-11-10 DIAGNOSIS — N186 End stage renal disease: Secondary | ICD-10-CM | POA: Diagnosis not present

## 2017-11-10 DIAGNOSIS — D631 Anemia in chronic kidney disease: Secondary | ICD-10-CM | POA: Diagnosis not present

## 2017-11-10 DIAGNOSIS — D509 Iron deficiency anemia, unspecified: Secondary | ICD-10-CM | POA: Diagnosis not present

## 2017-11-10 DIAGNOSIS — N2581 Secondary hyperparathyroidism of renal origin: Secondary | ICD-10-CM | POA: Diagnosis not present

## 2017-11-10 DIAGNOSIS — Z992 Dependence on renal dialysis: Secondary | ICD-10-CM | POA: Diagnosis not present

## 2017-11-10 LAB — TYPE AND SCREEN
ABO/RH(D): A POS
Antibody Screen: NEGATIVE
UNIT DIVISION: 0
Unit division: 0
Unit division: 0
Unit division: 0

## 2017-11-10 LAB — BPAM RBC
BLOOD PRODUCT EXPIRATION DATE: 201909062359
Blood Product Expiration Date: 201908252359
Blood Product Expiration Date: 201908252359
Blood Product Expiration Date: 201909062359
ISSUE DATE / TIME: 201908122323
ISSUE DATE / TIME: 201908130331
ISSUE DATE / TIME: 201908131314
UNIT TYPE AND RH: 600
UNIT TYPE AND RH: 600
UNIT TYPE AND RH: 6200
Unit Type and Rh: 6200

## 2017-11-11 DIAGNOSIS — N2581 Secondary hyperparathyroidism of renal origin: Secondary | ICD-10-CM | POA: Diagnosis not present

## 2017-11-11 DIAGNOSIS — D509 Iron deficiency anemia, unspecified: Secondary | ICD-10-CM | POA: Diagnosis not present

## 2017-11-11 DIAGNOSIS — Z992 Dependence on renal dialysis: Secondary | ICD-10-CM | POA: Diagnosis not present

## 2017-11-11 DIAGNOSIS — N186 End stage renal disease: Secondary | ICD-10-CM | POA: Diagnosis not present

## 2017-11-11 DIAGNOSIS — D631 Anemia in chronic kidney disease: Secondary | ICD-10-CM | POA: Diagnosis not present

## 2017-11-12 DIAGNOSIS — D631 Anemia in chronic kidney disease: Secondary | ICD-10-CM | POA: Diagnosis not present

## 2017-11-12 DIAGNOSIS — N186 End stage renal disease: Secondary | ICD-10-CM | POA: Diagnosis not present

## 2017-11-12 DIAGNOSIS — N2581 Secondary hyperparathyroidism of renal origin: Secondary | ICD-10-CM | POA: Diagnosis not present

## 2017-11-12 DIAGNOSIS — D509 Iron deficiency anemia, unspecified: Secondary | ICD-10-CM | POA: Diagnosis not present

## 2017-11-12 DIAGNOSIS — Z992 Dependence on renal dialysis: Secondary | ICD-10-CM | POA: Diagnosis not present

## 2017-11-13 DIAGNOSIS — N2581 Secondary hyperparathyroidism of renal origin: Secondary | ICD-10-CM | POA: Diagnosis not present

## 2017-11-13 DIAGNOSIS — Z992 Dependence on renal dialysis: Secondary | ICD-10-CM | POA: Diagnosis not present

## 2017-11-13 DIAGNOSIS — D631 Anemia in chronic kidney disease: Secondary | ICD-10-CM | POA: Diagnosis not present

## 2017-11-13 DIAGNOSIS — D509 Iron deficiency anemia, unspecified: Secondary | ICD-10-CM | POA: Diagnosis not present

## 2017-11-13 DIAGNOSIS — N186 End stage renal disease: Secondary | ICD-10-CM | POA: Diagnosis not present

## 2017-11-14 DIAGNOSIS — N2581 Secondary hyperparathyroidism of renal origin: Secondary | ICD-10-CM | POA: Diagnosis not present

## 2017-11-14 DIAGNOSIS — Z992 Dependence on renal dialysis: Secondary | ICD-10-CM | POA: Diagnosis not present

## 2017-11-14 DIAGNOSIS — D509 Iron deficiency anemia, unspecified: Secondary | ICD-10-CM | POA: Diagnosis not present

## 2017-11-14 DIAGNOSIS — N186 End stage renal disease: Secondary | ICD-10-CM | POA: Diagnosis not present

## 2017-11-14 DIAGNOSIS — D631 Anemia in chronic kidney disease: Secondary | ICD-10-CM | POA: Diagnosis not present

## 2017-11-15 DIAGNOSIS — N2581 Secondary hyperparathyroidism of renal origin: Secondary | ICD-10-CM | POA: Diagnosis not present

## 2017-11-15 DIAGNOSIS — Z992 Dependence on renal dialysis: Secondary | ICD-10-CM | POA: Diagnosis not present

## 2017-11-15 DIAGNOSIS — N186 End stage renal disease: Secondary | ICD-10-CM | POA: Diagnosis not present

## 2017-11-15 DIAGNOSIS — D631 Anemia in chronic kidney disease: Secondary | ICD-10-CM | POA: Diagnosis not present

## 2017-11-15 DIAGNOSIS — D509 Iron deficiency anemia, unspecified: Secondary | ICD-10-CM | POA: Diagnosis not present

## 2017-11-16 DIAGNOSIS — Z992 Dependence on renal dialysis: Secondary | ICD-10-CM | POA: Diagnosis not present

## 2017-11-16 DIAGNOSIS — N186 End stage renal disease: Secondary | ICD-10-CM | POA: Diagnosis not present

## 2017-11-16 DIAGNOSIS — D631 Anemia in chronic kidney disease: Secondary | ICD-10-CM | POA: Diagnosis not present

## 2017-11-16 DIAGNOSIS — N2581 Secondary hyperparathyroidism of renal origin: Secondary | ICD-10-CM | POA: Diagnosis not present

## 2017-11-16 DIAGNOSIS — D509 Iron deficiency anemia, unspecified: Secondary | ICD-10-CM | POA: Diagnosis not present

## 2017-11-17 DIAGNOSIS — D509 Iron deficiency anemia, unspecified: Secondary | ICD-10-CM | POA: Diagnosis not present

## 2017-11-17 DIAGNOSIS — N2581 Secondary hyperparathyroidism of renal origin: Secondary | ICD-10-CM | POA: Diagnosis not present

## 2017-11-17 DIAGNOSIS — N186 End stage renal disease: Secondary | ICD-10-CM | POA: Diagnosis not present

## 2017-11-17 DIAGNOSIS — D631 Anemia in chronic kidney disease: Secondary | ICD-10-CM | POA: Diagnosis not present

## 2017-11-17 DIAGNOSIS — Z992 Dependence on renal dialysis: Secondary | ICD-10-CM | POA: Diagnosis not present

## 2017-11-18 DIAGNOSIS — N2581 Secondary hyperparathyroidism of renal origin: Secondary | ICD-10-CM | POA: Diagnosis not present

## 2017-11-18 DIAGNOSIS — N186 End stage renal disease: Secondary | ICD-10-CM | POA: Diagnosis not present

## 2017-11-18 DIAGNOSIS — D509 Iron deficiency anemia, unspecified: Secondary | ICD-10-CM | POA: Diagnosis not present

## 2017-11-18 DIAGNOSIS — D631 Anemia in chronic kidney disease: Secondary | ICD-10-CM | POA: Diagnosis not present

## 2017-11-18 DIAGNOSIS — Z992 Dependence on renal dialysis: Secondary | ICD-10-CM | POA: Diagnosis not present

## 2017-11-19 DIAGNOSIS — D509 Iron deficiency anemia, unspecified: Secondary | ICD-10-CM | POA: Diagnosis not present

## 2017-11-19 DIAGNOSIS — Z992 Dependence on renal dialysis: Secondary | ICD-10-CM | POA: Diagnosis not present

## 2017-11-19 DIAGNOSIS — N2581 Secondary hyperparathyroidism of renal origin: Secondary | ICD-10-CM | POA: Diagnosis not present

## 2017-11-19 DIAGNOSIS — N186 End stage renal disease: Secondary | ICD-10-CM | POA: Diagnosis not present

## 2017-11-19 DIAGNOSIS — D631 Anemia in chronic kidney disease: Secondary | ICD-10-CM | POA: Diagnosis not present

## 2017-11-20 DIAGNOSIS — Z992 Dependence on renal dialysis: Secondary | ICD-10-CM | POA: Diagnosis not present

## 2017-11-20 DIAGNOSIS — N2581 Secondary hyperparathyroidism of renal origin: Secondary | ICD-10-CM | POA: Diagnosis not present

## 2017-11-20 DIAGNOSIS — D631 Anemia in chronic kidney disease: Secondary | ICD-10-CM | POA: Diagnosis not present

## 2017-11-20 DIAGNOSIS — N186 End stage renal disease: Secondary | ICD-10-CM | POA: Diagnosis not present

## 2017-11-20 DIAGNOSIS — E113413 Type 2 diabetes mellitus with severe nonproliferative diabetic retinopathy with macular edema, bilateral: Secondary | ICD-10-CM | POA: Diagnosis not present

## 2017-11-20 DIAGNOSIS — D509 Iron deficiency anemia, unspecified: Secondary | ICD-10-CM | POA: Diagnosis not present

## 2017-11-20 DIAGNOSIS — I953 Hypotension of hemodialysis: Secondary | ICD-10-CM | POA: Diagnosis not present

## 2017-11-21 DIAGNOSIS — N186 End stage renal disease: Secondary | ICD-10-CM | POA: Diagnosis not present

## 2017-11-21 DIAGNOSIS — N2581 Secondary hyperparathyroidism of renal origin: Secondary | ICD-10-CM | POA: Diagnosis not present

## 2017-11-21 DIAGNOSIS — D631 Anemia in chronic kidney disease: Secondary | ICD-10-CM | POA: Diagnosis not present

## 2017-11-21 DIAGNOSIS — Z992 Dependence on renal dialysis: Secondary | ICD-10-CM | POA: Diagnosis not present

## 2017-11-21 DIAGNOSIS — D509 Iron deficiency anemia, unspecified: Secondary | ICD-10-CM | POA: Diagnosis not present

## 2017-11-22 DIAGNOSIS — N2581 Secondary hyperparathyroidism of renal origin: Secondary | ICD-10-CM | POA: Diagnosis not present

## 2017-11-22 DIAGNOSIS — Z992 Dependence on renal dialysis: Secondary | ICD-10-CM | POA: Diagnosis not present

## 2017-11-22 DIAGNOSIS — D631 Anemia in chronic kidney disease: Secondary | ICD-10-CM | POA: Diagnosis not present

## 2017-11-22 DIAGNOSIS — N186 End stage renal disease: Secondary | ICD-10-CM | POA: Diagnosis not present

## 2017-11-22 DIAGNOSIS — D509 Iron deficiency anemia, unspecified: Secondary | ICD-10-CM | POA: Diagnosis not present

## 2017-11-23 DIAGNOSIS — Z992 Dependence on renal dialysis: Secondary | ICD-10-CM | POA: Diagnosis not present

## 2017-11-23 DIAGNOSIS — N2581 Secondary hyperparathyroidism of renal origin: Secondary | ICD-10-CM | POA: Diagnosis not present

## 2017-11-23 DIAGNOSIS — N186 End stage renal disease: Secondary | ICD-10-CM | POA: Diagnosis not present

## 2017-11-23 DIAGNOSIS — D509 Iron deficiency anemia, unspecified: Secondary | ICD-10-CM | POA: Diagnosis not present

## 2017-11-23 DIAGNOSIS — D631 Anemia in chronic kidney disease: Secondary | ICD-10-CM | POA: Diagnosis not present

## 2017-11-24 ENCOUNTER — Encounter (HOSPITAL_COMMUNITY)
Admission: RE | Disposition: A | Discharge status: Home / Self Care | Payer: Self-pay | Source: Ambulatory Visit | Attending: Internal Medicine

## 2017-11-24 ENCOUNTER — Encounter (HOSPITAL_COMMUNITY): Payer: Self-pay | Admitting: *Deleted

## 2017-11-24 ENCOUNTER — Ambulatory Visit (HOSPITAL_COMMUNITY)
Admission: RE | Admit: 2017-11-24 | Discharge: 2017-11-24 | Disposition: A | Discharge status: Home / Self Care | Payer: Medicare Other | Source: Ambulatory Visit | Attending: Internal Medicine | Admitting: Internal Medicine

## 2017-11-24 ENCOUNTER — Other Ambulatory Visit: Payer: Self-pay

## 2017-11-24 DIAGNOSIS — Z87891 Personal history of nicotine dependence: Secondary | ICD-10-CM | POA: Diagnosis not present

## 2017-11-24 DIAGNOSIS — E119 Type 2 diabetes mellitus without complications: Secondary | ICD-10-CM | POA: Diagnosis not present

## 2017-11-24 DIAGNOSIS — Z992 Dependence on renal dialysis: Secondary | ICD-10-CM | POA: Diagnosis not present

## 2017-11-24 DIAGNOSIS — I509 Heart failure, unspecified: Secondary | ICD-10-CM | POA: Insufficient documentation

## 2017-11-24 DIAGNOSIS — R51 Headache: Secondary | ICD-10-CM | POA: Insufficient documentation

## 2017-11-24 DIAGNOSIS — Z79899 Other long term (current) drug therapy: Secondary | ICD-10-CM | POA: Diagnosis not present

## 2017-11-24 DIAGNOSIS — Z96642 Presence of left artificial hip joint: Secondary | ICD-10-CM | POA: Insufficient documentation

## 2017-11-24 DIAGNOSIS — Z794 Long term (current) use of insulin: Secondary | ICD-10-CM | POA: Insufficient documentation

## 2017-11-24 DIAGNOSIS — Z5309 Procedure and treatment not carried out because of other contraindication: Secondary | ICD-10-CM | POA: Diagnosis not present

## 2017-11-24 DIAGNOSIS — D649 Anemia, unspecified: Secondary | ICD-10-CM | POA: Insufficient documentation

## 2017-11-24 DIAGNOSIS — I110 Hypertensive heart disease with heart failure: Secondary | ICD-10-CM | POA: Insufficient documentation

## 2017-11-24 DIAGNOSIS — Z538 Procedure and treatment not carried out for other reasons: Secondary | ICD-10-CM | POA: Diagnosis not present

## 2017-11-24 DIAGNOSIS — D509 Iron deficiency anemia, unspecified: Secondary | ICD-10-CM | POA: Diagnosis not present

## 2017-11-24 DIAGNOSIS — N289 Disorder of kidney and ureter, unspecified: Secondary | ICD-10-CM | POA: Diagnosis not present

## 2017-11-24 DIAGNOSIS — N2581 Secondary hyperparathyroidism of renal origin: Secondary | ICD-10-CM | POA: Diagnosis not present

## 2017-11-24 DIAGNOSIS — Z1211 Encounter for screening for malignant neoplasm of colon: Secondary | ICD-10-CM | POA: Diagnosis not present

## 2017-11-24 DIAGNOSIS — Z7982 Long term (current) use of aspirin: Secondary | ICD-10-CM | POA: Diagnosis not present

## 2017-11-24 DIAGNOSIS — Z89412 Acquired absence of left great toe: Secondary | ICD-10-CM | POA: Insufficient documentation

## 2017-11-24 DIAGNOSIS — N186 End stage renal disease: Secondary | ICD-10-CM | POA: Diagnosis not present

## 2017-11-24 DIAGNOSIS — D631 Anemia in chronic kidney disease: Secondary | ICD-10-CM | POA: Diagnosis not present

## 2017-11-24 HISTORY — DX: Headache, unspecified: R51.9

## 2017-11-24 HISTORY — PX: FLEXIBLE SIGMOIDOSCOPY: SHX5431

## 2017-11-24 HISTORY — DX: Anemia, unspecified: D64.9

## 2017-11-24 HISTORY — DX: Headache: R51

## 2017-11-24 LAB — GLUCOSE, CAPILLARY: GLUCOSE-CAPILLARY: 97 mg/dL (ref 70–99)

## 2017-11-24 SURGERY — SIGMOIDOSCOPY, FLEXIBLE
Anesthesia: Moderate Sedation

## 2017-11-24 MED ORDER — MIDAZOLAM HCL 5 MG/5ML IJ SOLN
INTRAMUSCULAR | Status: DC | PRN
  Administered 2017-11-24: 1 mg via INTRAVENOUS

## 2017-11-24 MED ORDER — ONDANSETRON HCL 4 MG/2ML IJ SOLN
INTRAMUSCULAR | Status: AC
  Filled 2017-11-24: qty 2

## 2017-11-24 MED ORDER — FENTANYL CITRATE (PF) 100 MCG/2ML IJ SOLN
INTRAMUSCULAR | Status: DC | PRN
  Administered 2017-11-24: 25 ug via INTRAVENOUS

## 2017-11-24 MED ORDER — FENTANYL CITRATE (PF) 100 MCG/2ML IJ SOLN
INTRAMUSCULAR | Status: AC
  Filled 2017-11-24: qty 2

## 2017-11-24 MED ORDER — SODIUM CHLORIDE 0.9 % IV SOLN
INTRAVENOUS | Status: DC
  Administered 2017-11-24: 1000 mL via INTRAVENOUS

## 2017-11-24 MED ORDER — MIDAZOLAM HCL 5 MG/5ML IJ SOLN
INTRAMUSCULAR | Status: AC
  Filled 2017-11-24: qty 5

## 2017-11-24 MED ORDER — MEPERIDINE HCL 50 MG/ML IJ SOLN
INTRAMUSCULAR | Status: AC
  Filled 2017-11-24: qty 1

## 2017-11-24 NOTE — Discharge Instructions (Signed)
°  Colonoscopy Discharge Instructions  Read the instructions outlined below and refer to this sheet in the next few weeks. These discharge instructions provide you with general information on caring for yourself after you leave the hospital. Your doctor may also give you specific instructions. While your treatment has been planned according to the most current medical practices available, unavoidable complications occasionally occur. If you have any problems or questions after discharge, call Dr. Gala Romney at [phone]. ACTIVITY  You may resume your regular activity, but move at a slower pace for the next 24 hours.   Take frequent rest periods for the next 24 hours.   Walking will help get rid of the air and reduce the bloated feeling in your belly (abdomen).   No driving for 24 hours (because of the medicine (anesthesia) used during the test).    Do not sign any important legal documents or operate any machinery for 24 hours (because of the anesthesia used during the test).  NUTRITION  Drink plenty of fluids.   You may resume your normal diet as instructed by your doctor.   Begin with a light meal and progress to your normal diet. Heavy or fried foods are harder to digest and may make you feel sick to your stomach (nauseated).   Avoid alcoholic beverages for 24 hours or as instructed.  MEDICATIONS  You may resume your normal medications unless your doctor tells you otherwise.  WHAT YOU CAN EXPECT TODAY  Some feelings of bloating in the abdomen.   Passage of more gas than usual.   Spotting of blood in your stool or on the toilet paper.  IF YOU HAD POLYPS REMOVED DURING THE COLONOSCOPY:  No aspirin products for 7 days or as instructed.   No alcohol for 7 days or as instructed.   Eat a soft diet for the next 24 hours.  FINDING OUT THE RESULTS OF YOUR TEST Not all test results are available during your visit. If your test results are not back during the visit, make an appointment  with your caregiver to find out the results. Do not assume everything is normal if you have not heard from your caregiver or the medical facility. It is important for you to follow up on all of your test results.  SEEK IMMEDIATE MEDICAL ATTENTION IF:  You have more than a spotting of blood in your stool.   Your belly is swollen (abdominal distention).   You are nauseated or vomiting.   You have a temperature over 101.   You have abdominal pain or discomfort that is severe or gets worse throughout the day.     Your colonoscopy preparation was poor. I was unable to complete your examination today.  My office will call you next week to reschedule.

## 2017-11-24 NOTE — Op Note (Signed)
[facility] Patient Name: Janet Mitchell Procedure Date: 11/24/2017 12:32 PM MRN: [id number] Date of Birth: 12-11-1965 Attending MD: Norvel Richards , MD CSN: [id number] Age: 52 Admit Type: Outpatient Procedure:                Limited Sigmoidoscopy (attempted colonosopy) Indications:              Screening for colorectal malignant neoplasm Providers:                Norvel Richards, MD, Lurline Del, RN, Randa Spike, Technician Referring MD:              Medicines:                Midazolam 1 mg IV, Fentanyl 25 micrograms IV,                            Ondansetron 4 mg IV Complications:            No immediate complications. Estimated Blood Loss:     Estimated blood loss: none. Procedure:                Pre-Anesthesia Assessment:                           - Prior to the procedure, a History and Physical                            was performed, and patient medications and                            allergies were reviewed. The patient's tolerance of                            previous anesthesia was also reviewed. The risks                            and benefits of the procedure and the sedation                            options and risks were discussed with the patient.                            All questions were answered, and informed consent                            was obtained. Prior Anticoagulants: The patient has                            taken no previous anticoagulant or antiplatelet                            agents. ASA Grade Assessment: III - A patient with  severe systemic disease. After reviewing the risks                            and benefits, the patient was deemed in                            satisfactory condition to undergo the procedure.                           After obtaining informed consent, the colonoscope                            was passed under direct vision. Throughout the                             procedure, the patient's blood pressure, pulse, and                            oxygen saturations were monitored continuously. The                            [id number]-[id number] ([id number]) scope was introduced through                            the anus and advanced to the the rectum. The                            quality of the bowel preparation was inadequate. Scope In: 1:14:48 PM Scope Out: 1:16:12 PM Total Procedure Duration: 0 hours 1 minute 24 seconds  Findings:      The perianal and digital rectal examinations were normal. Viscous,       semi-formed stool seen in the rectum, trailing up into the sigmoid. This       precluded completion of colonoscopy. Procedure was aborted. Impression:               - Preparation of the colon was inadequate.                            Colonoscopy not done. See above.                           - No specimens collected. Moderate Sedation:      Moderate (conscious) sedation was administered by the endoscopy nurse       and supervised by the endoscopist. The following parameters were       monitored: oxygen saturation, heart rate, blood pressure, respiratory       rate, EKG, adequacy of pulmonary ventilation, and response to care.       Total physician intraservice time was 6 minutes. Recommendation:           - Patient has a contact number available for                            emergencies. The signs and symptoms of potential  delayed complications were discussed with the                            patient. Return to normal activities tomorrow.                            Written discharge instructions were provided to the                            patient.                           - Advance diet as tolerated.                           - Repeat colonoscopy at the next available                            appointment for screening purposes.                           - Return to GI office (date not yet  determined). Procedure Code(s):        --- Professional ---                           [phone], 21, Colonoscopy, flexible; diagnostic,                            including collection of specimen(s) by brushing or                            washing, when performed (separate procedure) Diagnosis Code(s):        --- Professional ---                           Z12.11, Encounter for screening for malignant                            neoplasm of colon CPT copyright 2017 [employer]. All rights reserved. The codes documented in this report are preliminary and upon coder review may  be revised to meet current compliance requirements. Cristopher Estimable. Rourk, MD Norvel Richards, MD 11/24/2017 1:23:54 PM This report has been signed electronically. Number of Addenda: 0

## 2017-11-24 NOTE — H&P (Signed)
@LOGO @   Primary Care Physician:  Lucianne Lei, MD Primary Gastroenterologist:  Dr. Gala Romney  Pre-Procedure History & Physical: HPI:  Janet Mitchell is a 52 y.o. female is here for a screening colonoscopy. No bowel symptoms. No family history of colon cancer. No prior colonoscopy.  Past Medical History:  Diagnosis Date  . Anemia   . CHF (congestive heart failure) ([place])   . Diabetes mellitus without complication ([place])   . Headache   . Hypertension   . Renal disorder     Past Surgical History:  Procedure Laterality Date  . AMPUTATION TOE Left    Great left toe 2013.  Marland Kitchen CESAREAN SECTION     x2. 1994 and 1998  . TOTAL HIP ARTHROPLASTY Left 1997    Prior to Admission medications   Medication Sig Start Date End Date Taking? Authorizing Provider  amLODipine (NORVASC) 10 MG tablet Take 1 tablet (10 mg total) by mouth daily. 11/09/17  Yes Emokpae, Courage, MD  aspirin EC 81 MG tablet Take 81 mg by mouth at bedtime.    Yes [provider]  calcitRIOL (ROCALTROL) 0.5 MCG capsule Take 0.5-1 mcg by mouth See admin instructions. 0.57mcg on Mondays through Fridays and take 44mcg on Saturdays and Sundays   Yes [provider]  Insulin Glargine (TOUJEO MAX SOLOSTAR) 300 UNIT/ML SOPN Inject 14 Units into the skin at bedtime. 11/09/17  Yes Emokpae, Courage, MD  metoprolol succinate (TOPROL-XL) 100 MG 24 hr tablet Take 1 tablet (100 mg total) by mouth daily. Take with or immediately following a meal. 11/09/17  Yes Emokpae, Courage, MD  polyethylene glycol-electrolytes (TRILYTE) 420 g solution Take 4,000 mLs by mouth as directed. 10/30/17  Yes Carlis Stable, NP  torsemide (DEMADEX) 100 MG tablet Take 1 tablet (100 mg total) by mouth at bedtime. 11/09/17  Yes Emokpae, Courage, MD  multivitamin (RENA-VIT) TABS tablet Take 1 tablet by mouth at bedtime. Patient not taking: Reported on 11/17/2017 11/09/17   Roxan Hockey, MD    Allergies as of 10/30/2017  . (No Known Allergies)     Family History  Problem Relation Age of Onset  . Heart disease Mother   . Thrombocytopenia Mother        TTP  . Heart failure Father   . Kidney disease Paternal Grandfather     Social History   Socioeconomic History  . Marital status: Married    Spouse name: Not on file  . Number of children: Not on file  . Years of education: Not on file  . Highest education level: Not on file  Occupational History  . Not on file  Social Needs  . Financial resource strain: Not on file  . Food insecurity:    Worry: Not on file    Inability: Not on file  . Transportation needs:    Medical: Not on file    Non-medical: Not on file  Tobacco Use  . Smoking status: Former Research scientist (life sciences)  . Smokeless tobacco: Never Used  Substance and Sexual Activity  . Alcohol use: Not Currently  . Drug use: Not Currently  . Sexual activity: Not on file  Lifestyle  . Physical activity:    Days per week: Not on file    Minutes per session: Not on file  . Stress: Not on file  Relationships  . Social connections:    Talks on phone: Not on file    Gets together: Not on file    Attends religious service: Not on file  Active member of club or organization: Not on file    Attends meetings of clubs or organizations: Not on file    Relationship status: Not on file  . Intimate partner violence:    Fear of current or ex partner: Not on file    Emotionally abused: Not on file    Physically abused: Not on file    Forced sexual activity: Not on file  Other Topics Concern  . Not on file  Social History Narrative  . Not on file    Review of Systems: See HPI, otherwise negative ROS  Physical Exam: BP (!) 155/68   Pulse 66   Temp 97.7 F (36.5 C) (Oral)   Resp 11   Ht 5\' 8"  (1.727 m)   Wt 116.6 kg   LMP 10/30/2017   SpO2 100%   BMI 39.08 kg/m  General:   Alert,  Well-developed, well-nourished, pleasant and cooperative in NAD Lungs:  Clear throughout to auscultation.   No wheezes, crackles, or  rhonchi. No acute distress. Heart:  Regular rate and rhythm; no murmurs, clicks, rubs,  or gallops. Abdomen:  Soft, nontender and nondistended. No masses, hepatosplenomegaly or hernias noted. Normal bowel sounds, without guarding, and without rebound.    Impression/Plan: Janet Mitchell is now here to undergo a screening colonoscopy.  First ever average risk screening examination.  Risks, benefits, limitations, imponderables and alternatives regarding colonoscopy have been reviewed with the patient. Questions have been answered. All parties agreeable.     Notice:  This dictation was prepared with Dragon dictation along with smaller phrase technology. Any transcriptional errors that result from this process are unintentional and may not be corrected upon review.

## 2017-11-25 DIAGNOSIS — N186 End stage renal disease: Secondary | ICD-10-CM | POA: Diagnosis not present

## 2017-11-25 DIAGNOSIS — D631 Anemia in chronic kidney disease: Secondary | ICD-10-CM | POA: Diagnosis not present

## 2017-11-25 DIAGNOSIS — Z992 Dependence on renal dialysis: Secondary | ICD-10-CM | POA: Diagnosis not present

## 2017-11-25 DIAGNOSIS — N2581 Secondary hyperparathyroidism of renal origin: Secondary | ICD-10-CM | POA: Diagnosis not present

## 2017-11-25 DIAGNOSIS — D509 Iron deficiency anemia, unspecified: Secondary | ICD-10-CM | POA: Diagnosis not present

## 2017-11-26 DIAGNOSIS — D631 Anemia in chronic kidney disease: Secondary | ICD-10-CM | POA: Diagnosis not present

## 2017-11-26 DIAGNOSIS — Z992 Dependence on renal dialysis: Secondary | ICD-10-CM | POA: Diagnosis not present

## 2017-11-26 DIAGNOSIS — N186 End stage renal disease: Secondary | ICD-10-CM | POA: Diagnosis not present

## 2017-11-26 DIAGNOSIS — D509 Iron deficiency anemia, unspecified: Secondary | ICD-10-CM | POA: Diagnosis not present

## 2017-11-27 DIAGNOSIS — Z992 Dependence on renal dialysis: Secondary | ICD-10-CM | POA: Diagnosis not present

## 2017-11-27 DIAGNOSIS — N186 End stage renal disease: Secondary | ICD-10-CM | POA: Diagnosis not present

## 2017-11-27 DIAGNOSIS — D509 Iron deficiency anemia, unspecified: Secondary | ICD-10-CM | POA: Diagnosis not present

## 2017-11-27 DIAGNOSIS — D631 Anemia in chronic kidney disease: Secondary | ICD-10-CM | POA: Diagnosis not present

## 2017-11-28 DIAGNOSIS — D509 Iron deficiency anemia, unspecified: Secondary | ICD-10-CM | POA: Diagnosis not present

## 2017-11-28 DIAGNOSIS — Z992 Dependence on renal dialysis: Secondary | ICD-10-CM | POA: Diagnosis not present

## 2017-11-28 DIAGNOSIS — D631 Anemia in chronic kidney disease: Secondary | ICD-10-CM | POA: Diagnosis not present

## 2017-11-28 DIAGNOSIS — N186 End stage renal disease: Secondary | ICD-10-CM | POA: Diagnosis not present

## 2017-11-29 ENCOUNTER — Encounter (HOSPITAL_COMMUNITY): Payer: Self-pay | Admitting: Internal Medicine

## 2017-11-29 DIAGNOSIS — Z992 Dependence on renal dialysis: Secondary | ICD-10-CM | POA: Diagnosis not present

## 2017-11-29 DIAGNOSIS — D509 Iron deficiency anemia, unspecified: Secondary | ICD-10-CM | POA: Diagnosis not present

## 2017-11-29 DIAGNOSIS — N186 End stage renal disease: Secondary | ICD-10-CM | POA: Diagnosis not present

## 2017-11-29 DIAGNOSIS — D631 Anemia in chronic kidney disease: Secondary | ICD-10-CM | POA: Diagnosis not present

## 2017-11-30 DIAGNOSIS — Z992 Dependence on renal dialysis: Secondary | ICD-10-CM | POA: Diagnosis not present

## 2017-11-30 DIAGNOSIS — N186 End stage renal disease: Secondary | ICD-10-CM | POA: Diagnosis not present

## 2017-11-30 DIAGNOSIS — D631 Anemia in chronic kidney disease: Secondary | ICD-10-CM | POA: Diagnosis not present

## 2017-11-30 DIAGNOSIS — D509 Iron deficiency anemia, unspecified: Secondary | ICD-10-CM | POA: Diagnosis not present

## 2017-12-01 DIAGNOSIS — D631 Anemia in chronic kidney disease: Secondary | ICD-10-CM | POA: Diagnosis not present

## 2017-12-01 DIAGNOSIS — N186 End stage renal disease: Secondary | ICD-10-CM | POA: Diagnosis not present

## 2017-12-01 DIAGNOSIS — Z992 Dependence on renal dialysis: Secondary | ICD-10-CM | POA: Diagnosis not present

## 2017-12-01 DIAGNOSIS — D509 Iron deficiency anemia, unspecified: Secondary | ICD-10-CM | POA: Diagnosis not present

## 2017-12-02 DIAGNOSIS — Z992 Dependence on renal dialysis: Secondary | ICD-10-CM | POA: Diagnosis not present

## 2017-12-02 DIAGNOSIS — D631 Anemia in chronic kidney disease: Secondary | ICD-10-CM | POA: Diagnosis not present

## 2017-12-02 DIAGNOSIS — N186 End stage renal disease: Secondary | ICD-10-CM | POA: Diagnosis not present

## 2017-12-02 DIAGNOSIS — D509 Iron deficiency anemia, unspecified: Secondary | ICD-10-CM | POA: Diagnosis not present

## 2017-12-03 DIAGNOSIS — D631 Anemia in chronic kidney disease: Secondary | ICD-10-CM | POA: Diagnosis not present

## 2017-12-03 DIAGNOSIS — N186 End stage renal disease: Secondary | ICD-10-CM | POA: Diagnosis not present

## 2017-12-03 DIAGNOSIS — D509 Iron deficiency anemia, unspecified: Secondary | ICD-10-CM | POA: Diagnosis not present

## 2017-12-03 DIAGNOSIS — Z992 Dependence on renal dialysis: Secondary | ICD-10-CM | POA: Diagnosis not present

## 2017-12-04 ENCOUNTER — Telehealth: Payer: Self-pay | Admitting: Internal Medicine

## 2017-12-04 DIAGNOSIS — D509 Iron deficiency anemia, unspecified: Secondary | ICD-10-CM | POA: Diagnosis not present

## 2017-12-04 DIAGNOSIS — D631 Anemia in chronic kidney disease: Secondary | ICD-10-CM | POA: Diagnosis not present

## 2017-12-04 DIAGNOSIS — Z1231 Encounter for screening mammogram for malignant neoplasm of breast: Secondary | ICD-10-CM | POA: Diagnosis not present

## 2017-12-04 DIAGNOSIS — N186 End stage renal disease: Secondary | ICD-10-CM | POA: Diagnosis not present

## 2017-12-04 DIAGNOSIS — Z992 Dependence on renal dialysis: Secondary | ICD-10-CM | POA: Diagnosis not present

## 2017-12-04 NOTE — Telephone Encounter (Signed)
Pt was not cleaned out for procedure. Per AB- pt needs ov to reschedule to make sure she is cleaned out next time. Please schedule OV.

## 2017-12-04 NOTE — Telephone Encounter (Signed)
[phone]  Please call patient, she said her tcs needed to be rescheduled, she was not cleaned out enough for her last one

## 2017-12-05 ENCOUNTER — Encounter: Payer: Self-pay | Admitting: Gastroenterology

## 2017-12-05 DIAGNOSIS — Z992 Dependence on renal dialysis: Secondary | ICD-10-CM | POA: Diagnosis not present

## 2017-12-05 DIAGNOSIS — D509 Iron deficiency anemia, unspecified: Secondary | ICD-10-CM | POA: Diagnosis not present

## 2017-12-05 DIAGNOSIS — D631 Anemia in chronic kidney disease: Secondary | ICD-10-CM | POA: Diagnosis not present

## 2017-12-05 DIAGNOSIS — N186 End stage renal disease: Secondary | ICD-10-CM | POA: Diagnosis not present

## 2017-12-05 NOTE — Telephone Encounter (Signed)
PATIENT SCHEDULED AND LETTER SENT  °

## 2017-12-06 DIAGNOSIS — D509 Iron deficiency anemia, unspecified: Secondary | ICD-10-CM | POA: Diagnosis not present

## 2017-12-06 DIAGNOSIS — D631 Anemia in chronic kidney disease: Secondary | ICD-10-CM | POA: Diagnosis not present

## 2017-12-06 DIAGNOSIS — N186 End stage renal disease: Secondary | ICD-10-CM | POA: Diagnosis not present

## 2017-12-06 DIAGNOSIS — Z992 Dependence on renal dialysis: Secondary | ICD-10-CM | POA: Diagnosis not present

## 2017-12-07 DIAGNOSIS — Z992 Dependence on renal dialysis: Secondary | ICD-10-CM | POA: Diagnosis not present

## 2017-12-07 DIAGNOSIS — N186 End stage renal disease: Secondary | ICD-10-CM | POA: Diagnosis not present

## 2017-12-07 DIAGNOSIS — D509 Iron deficiency anemia, unspecified: Secondary | ICD-10-CM | POA: Diagnosis not present

## 2017-12-07 DIAGNOSIS — D631 Anemia in chronic kidney disease: Secondary | ICD-10-CM | POA: Diagnosis not present

## 2017-12-08 DIAGNOSIS — D631 Anemia in chronic kidney disease: Secondary | ICD-10-CM | POA: Diagnosis not present

## 2017-12-08 DIAGNOSIS — Z992 Dependence on renal dialysis: Secondary | ICD-10-CM | POA: Diagnosis not present

## 2017-12-08 DIAGNOSIS — N186 End stage renal disease: Secondary | ICD-10-CM | POA: Diagnosis not present

## 2017-12-08 DIAGNOSIS — D509 Iron deficiency anemia, unspecified: Secondary | ICD-10-CM | POA: Diagnosis not present

## 2017-12-09 DIAGNOSIS — Z992 Dependence on renal dialysis: Secondary | ICD-10-CM | POA: Diagnosis not present

## 2017-12-09 DIAGNOSIS — N186 End stage renal disease: Secondary | ICD-10-CM | POA: Diagnosis not present

## 2017-12-09 DIAGNOSIS — D509 Iron deficiency anemia, unspecified: Secondary | ICD-10-CM | POA: Diagnosis not present

## 2017-12-09 DIAGNOSIS — D631 Anemia in chronic kidney disease: Secondary | ICD-10-CM | POA: Diagnosis not present

## 2017-12-10 DIAGNOSIS — D509 Iron deficiency anemia, unspecified: Secondary | ICD-10-CM | POA: Diagnosis not present

## 2017-12-10 DIAGNOSIS — N186 End stage renal disease: Secondary | ICD-10-CM | POA: Diagnosis not present

## 2017-12-10 DIAGNOSIS — D631 Anemia in chronic kidney disease: Secondary | ICD-10-CM | POA: Diagnosis not present

## 2017-12-10 DIAGNOSIS — Z992 Dependence on renal dialysis: Secondary | ICD-10-CM | POA: Diagnosis not present

## 2017-12-11 DIAGNOSIS — D509 Iron deficiency anemia, unspecified: Secondary | ICD-10-CM | POA: Diagnosis not present

## 2017-12-11 DIAGNOSIS — N186 End stage renal disease: Secondary | ICD-10-CM | POA: Diagnosis not present

## 2017-12-11 DIAGNOSIS — D631 Anemia in chronic kidney disease: Secondary | ICD-10-CM | POA: Diagnosis not present

## 2017-12-11 DIAGNOSIS — Z992 Dependence on renal dialysis: Secondary | ICD-10-CM | POA: Diagnosis not present

## 2017-12-12 DIAGNOSIS — D631 Anemia in chronic kidney disease: Secondary | ICD-10-CM | POA: Diagnosis not present

## 2017-12-12 DIAGNOSIS — N186 End stage renal disease: Secondary | ICD-10-CM | POA: Diagnosis not present

## 2017-12-12 DIAGNOSIS — D509 Iron deficiency anemia, unspecified: Secondary | ICD-10-CM | POA: Diagnosis not present

## 2017-12-12 DIAGNOSIS — Z992 Dependence on renal dialysis: Secondary | ICD-10-CM | POA: Diagnosis not present

## 2017-12-13 DIAGNOSIS — N186 End stage renal disease: Secondary | ICD-10-CM | POA: Diagnosis not present

## 2017-12-13 DIAGNOSIS — D509 Iron deficiency anemia, unspecified: Secondary | ICD-10-CM | POA: Diagnosis not present

## 2017-12-13 DIAGNOSIS — D631 Anemia in chronic kidney disease: Secondary | ICD-10-CM | POA: Diagnosis not present

## 2017-12-13 DIAGNOSIS — Z992 Dependence on renal dialysis: Secondary | ICD-10-CM | POA: Diagnosis not present

## 2017-12-14 DIAGNOSIS — N186 End stage renal disease: Secondary | ICD-10-CM | POA: Diagnosis not present

## 2017-12-14 DIAGNOSIS — D631 Anemia in chronic kidney disease: Secondary | ICD-10-CM | POA: Diagnosis not present

## 2017-12-14 DIAGNOSIS — Z992 Dependence on renal dialysis: Secondary | ICD-10-CM | POA: Diagnosis not present

## 2017-12-14 DIAGNOSIS — D509 Iron deficiency anemia, unspecified: Secondary | ICD-10-CM | POA: Diagnosis not present

## 2017-12-15 DIAGNOSIS — Z992 Dependence on renal dialysis: Secondary | ICD-10-CM | POA: Diagnosis not present

## 2017-12-15 DIAGNOSIS — D509 Iron deficiency anemia, unspecified: Secondary | ICD-10-CM | POA: Diagnosis not present

## 2017-12-15 DIAGNOSIS — D631 Anemia in chronic kidney disease: Secondary | ICD-10-CM | POA: Diagnosis not present

## 2017-12-15 DIAGNOSIS — N186 End stage renal disease: Secondary | ICD-10-CM | POA: Diagnosis not present

## 2017-12-16 DIAGNOSIS — N186 End stage renal disease: Secondary | ICD-10-CM | POA: Diagnosis not present

## 2017-12-16 DIAGNOSIS — D509 Iron deficiency anemia, unspecified: Secondary | ICD-10-CM | POA: Diagnosis not present

## 2017-12-16 DIAGNOSIS — D631 Anemia in chronic kidney disease: Secondary | ICD-10-CM | POA: Diagnosis not present

## 2017-12-16 DIAGNOSIS — Z992 Dependence on renal dialysis: Secondary | ICD-10-CM | POA: Diagnosis not present

## 2017-12-17 DIAGNOSIS — D509 Iron deficiency anemia, unspecified: Secondary | ICD-10-CM | POA: Diagnosis not present

## 2017-12-17 DIAGNOSIS — D631 Anemia in chronic kidney disease: Secondary | ICD-10-CM | POA: Diagnosis not present

## 2017-12-17 DIAGNOSIS — Z992 Dependence on renal dialysis: Secondary | ICD-10-CM | POA: Diagnosis not present

## 2017-12-17 DIAGNOSIS — N186 End stage renal disease: Secondary | ICD-10-CM | POA: Diagnosis not present

## 2017-12-18 DIAGNOSIS — Z992 Dependence on renal dialysis: Secondary | ICD-10-CM | POA: Diagnosis not present

## 2017-12-18 DIAGNOSIS — N186 End stage renal disease: Secondary | ICD-10-CM | POA: Diagnosis not present

## 2017-12-18 DIAGNOSIS — D509 Iron deficiency anemia, unspecified: Secondary | ICD-10-CM | POA: Diagnosis not present

## 2017-12-18 DIAGNOSIS — D631 Anemia in chronic kidney disease: Secondary | ICD-10-CM | POA: Diagnosis not present

## 2017-12-19 DIAGNOSIS — D631 Anemia in chronic kidney disease: Secondary | ICD-10-CM | POA: Diagnosis not present

## 2017-12-19 DIAGNOSIS — N186 End stage renal disease: Secondary | ICD-10-CM | POA: Diagnosis not present

## 2017-12-19 DIAGNOSIS — Z01818 Encounter for other preprocedural examination: Secondary | ICD-10-CM | POA: Insufficient documentation

## 2017-12-19 DIAGNOSIS — D509 Iron deficiency anemia, unspecified: Secondary | ICD-10-CM | POA: Diagnosis not present

## 2017-12-19 DIAGNOSIS — Z992 Dependence on renal dialysis: Secondary | ICD-10-CM | POA: Diagnosis not present

## 2017-12-20 DIAGNOSIS — N186 End stage renal disease: Secondary | ICD-10-CM | POA: Diagnosis not present

## 2017-12-20 DIAGNOSIS — D509 Iron deficiency anemia, unspecified: Secondary | ICD-10-CM | POA: Diagnosis not present

## 2017-12-20 DIAGNOSIS — D631 Anemia in chronic kidney disease: Secondary | ICD-10-CM | POA: Diagnosis not present

## 2017-12-20 DIAGNOSIS — Z992 Dependence on renal dialysis: Secondary | ICD-10-CM | POA: Diagnosis not present

## 2017-12-21 DIAGNOSIS — D631 Anemia in chronic kidney disease: Secondary | ICD-10-CM | POA: Diagnosis not present

## 2017-12-21 DIAGNOSIS — D509 Iron deficiency anemia, unspecified: Secondary | ICD-10-CM | POA: Diagnosis not present

## 2017-12-21 DIAGNOSIS — Z992 Dependence on renal dialysis: Secondary | ICD-10-CM | POA: Diagnosis not present

## 2017-12-21 DIAGNOSIS — N186 End stage renal disease: Secondary | ICD-10-CM | POA: Diagnosis not present

## 2017-12-22 DIAGNOSIS — Z992 Dependence on renal dialysis: Secondary | ICD-10-CM | POA: Diagnosis not present

## 2017-12-22 DIAGNOSIS — D509 Iron deficiency anemia, unspecified: Secondary | ICD-10-CM | POA: Diagnosis not present

## 2017-12-22 DIAGNOSIS — D631 Anemia in chronic kidney disease: Secondary | ICD-10-CM | POA: Diagnosis not present

## 2017-12-22 DIAGNOSIS — N186 End stage renal disease: Secondary | ICD-10-CM | POA: Diagnosis not present

## 2017-12-23 DIAGNOSIS — N186 End stage renal disease: Secondary | ICD-10-CM | POA: Diagnosis not present

## 2017-12-23 DIAGNOSIS — D631 Anemia in chronic kidney disease: Secondary | ICD-10-CM | POA: Diagnosis not present

## 2017-12-23 DIAGNOSIS — Z992 Dependence on renal dialysis: Secondary | ICD-10-CM | POA: Diagnosis not present

## 2017-12-23 DIAGNOSIS — D509 Iron deficiency anemia, unspecified: Secondary | ICD-10-CM | POA: Diagnosis not present

## 2017-12-24 DIAGNOSIS — Z992 Dependence on renal dialysis: Secondary | ICD-10-CM | POA: Diagnosis not present

## 2017-12-24 DIAGNOSIS — D631 Anemia in chronic kidney disease: Secondary | ICD-10-CM | POA: Diagnosis not present

## 2017-12-24 DIAGNOSIS — N186 End stage renal disease: Secondary | ICD-10-CM | POA: Diagnosis not present

## 2017-12-24 DIAGNOSIS — D509 Iron deficiency anemia, unspecified: Secondary | ICD-10-CM | POA: Diagnosis not present

## 2017-12-25 DIAGNOSIS — D509 Iron deficiency anemia, unspecified: Secondary | ICD-10-CM | POA: Diagnosis not present

## 2017-12-25 DIAGNOSIS — N186 End stage renal disease: Secondary | ICD-10-CM | POA: Diagnosis not present

## 2017-12-25 DIAGNOSIS — Z992 Dependence on renal dialysis: Secondary | ICD-10-CM | POA: Diagnosis not present

## 2017-12-25 DIAGNOSIS — D631 Anemia in chronic kidney disease: Secondary | ICD-10-CM | POA: Diagnosis not present

## 2017-12-26 DIAGNOSIS — N2581 Secondary hyperparathyroidism of renal origin: Secondary | ICD-10-CM | POA: Diagnosis not present

## 2017-12-26 DIAGNOSIS — D631 Anemia in chronic kidney disease: Secondary | ICD-10-CM | POA: Diagnosis not present

## 2017-12-26 DIAGNOSIS — Z23 Encounter for immunization: Secondary | ICD-10-CM | POA: Diagnosis not present

## 2017-12-26 DIAGNOSIS — N186 End stage renal disease: Secondary | ICD-10-CM | POA: Diagnosis not present

## 2017-12-26 DIAGNOSIS — D509 Iron deficiency anemia, unspecified: Secondary | ICD-10-CM | POA: Diagnosis not present

## 2017-12-26 DIAGNOSIS — Z992 Dependence on renal dialysis: Secondary | ICD-10-CM | POA: Diagnosis not present

## 2017-12-27 DIAGNOSIS — Z23 Encounter for immunization: Secondary | ICD-10-CM | POA: Diagnosis not present

## 2017-12-27 DIAGNOSIS — N2581 Secondary hyperparathyroidism of renal origin: Secondary | ICD-10-CM | POA: Diagnosis not present

## 2017-12-27 DIAGNOSIS — N186 End stage renal disease: Secondary | ICD-10-CM | POA: Diagnosis not present

## 2017-12-27 DIAGNOSIS — D631 Anemia in chronic kidney disease: Secondary | ICD-10-CM | POA: Diagnosis not present

## 2017-12-27 DIAGNOSIS — Z992 Dependence on renal dialysis: Secondary | ICD-10-CM | POA: Diagnosis not present

## 2017-12-27 DIAGNOSIS — D509 Iron deficiency anemia, unspecified: Secondary | ICD-10-CM | POA: Diagnosis not present

## 2017-12-28 DIAGNOSIS — Z23 Encounter for immunization: Secondary | ICD-10-CM | POA: Diagnosis not present

## 2017-12-28 DIAGNOSIS — N2581 Secondary hyperparathyroidism of renal origin: Secondary | ICD-10-CM | POA: Diagnosis not present

## 2017-12-28 DIAGNOSIS — Z992 Dependence on renal dialysis: Secondary | ICD-10-CM | POA: Diagnosis not present

## 2017-12-28 DIAGNOSIS — N186 End stage renal disease: Secondary | ICD-10-CM | POA: Diagnosis not present

## 2017-12-28 DIAGNOSIS — D509 Iron deficiency anemia, unspecified: Secondary | ICD-10-CM | POA: Diagnosis not present

## 2017-12-28 DIAGNOSIS — D631 Anemia in chronic kidney disease: Secondary | ICD-10-CM | POA: Diagnosis not present

## 2017-12-29 DIAGNOSIS — N2581 Secondary hyperparathyroidism of renal origin: Secondary | ICD-10-CM | POA: Diagnosis not present

## 2017-12-29 DIAGNOSIS — Z23 Encounter for immunization: Secondary | ICD-10-CM | POA: Diagnosis not present

## 2017-12-29 DIAGNOSIS — Z992 Dependence on renal dialysis: Secondary | ICD-10-CM | POA: Diagnosis not present

## 2017-12-29 DIAGNOSIS — D631 Anemia in chronic kidney disease: Secondary | ICD-10-CM | POA: Diagnosis not present

## 2017-12-29 DIAGNOSIS — N186 End stage renal disease: Secondary | ICD-10-CM | POA: Diagnosis not present

## 2017-12-29 DIAGNOSIS — D509 Iron deficiency anemia, unspecified: Secondary | ICD-10-CM | POA: Diagnosis not present

## 2017-12-30 DIAGNOSIS — D509 Iron deficiency anemia, unspecified: Secondary | ICD-10-CM | POA: Diagnosis not present

## 2017-12-30 DIAGNOSIS — Z992 Dependence on renal dialysis: Secondary | ICD-10-CM | POA: Diagnosis not present

## 2017-12-30 DIAGNOSIS — Z23 Encounter for immunization: Secondary | ICD-10-CM | POA: Diagnosis not present

## 2017-12-30 DIAGNOSIS — N186 End stage renal disease: Secondary | ICD-10-CM | POA: Diagnosis not present

## 2017-12-30 DIAGNOSIS — D631 Anemia in chronic kidney disease: Secondary | ICD-10-CM | POA: Diagnosis not present

## 2017-12-30 DIAGNOSIS — N2581 Secondary hyperparathyroidism of renal origin: Secondary | ICD-10-CM | POA: Diagnosis not present

## 2017-12-31 DIAGNOSIS — N2581 Secondary hyperparathyroidism of renal origin: Secondary | ICD-10-CM | POA: Diagnosis not present

## 2017-12-31 DIAGNOSIS — Z992 Dependence on renal dialysis: Secondary | ICD-10-CM | POA: Diagnosis not present

## 2017-12-31 DIAGNOSIS — D631 Anemia in chronic kidney disease: Secondary | ICD-10-CM | POA: Diagnosis not present

## 2017-12-31 DIAGNOSIS — N186 End stage renal disease: Secondary | ICD-10-CM | POA: Diagnosis not present

## 2017-12-31 DIAGNOSIS — D509 Iron deficiency anemia, unspecified: Secondary | ICD-10-CM | POA: Diagnosis not present

## 2017-12-31 DIAGNOSIS — Z23 Encounter for immunization: Secondary | ICD-10-CM | POA: Diagnosis not present

## 2018-01-01 DIAGNOSIS — N186 End stage renal disease: Secondary | ICD-10-CM | POA: Diagnosis not present

## 2018-01-01 DIAGNOSIS — Z992 Dependence on renal dialysis: Secondary | ICD-10-CM | POA: Diagnosis not present

## 2018-01-01 DIAGNOSIS — Z23 Encounter for immunization: Secondary | ICD-10-CM | POA: Diagnosis not present

## 2018-01-01 DIAGNOSIS — D509 Iron deficiency anemia, unspecified: Secondary | ICD-10-CM | POA: Diagnosis not present

## 2018-01-01 DIAGNOSIS — N2581 Secondary hyperparathyroidism of renal origin: Secondary | ICD-10-CM | POA: Diagnosis not present

## 2018-01-01 DIAGNOSIS — D631 Anemia in chronic kidney disease: Secondary | ICD-10-CM | POA: Diagnosis not present

## 2018-01-02 DIAGNOSIS — Z992 Dependence on renal dialysis: Secondary | ICD-10-CM | POA: Diagnosis not present

## 2018-01-02 DIAGNOSIS — Z23 Encounter for immunization: Secondary | ICD-10-CM | POA: Diagnosis not present

## 2018-01-02 DIAGNOSIS — N186 End stage renal disease: Secondary | ICD-10-CM | POA: Diagnosis not present

## 2018-01-02 DIAGNOSIS — D631 Anemia in chronic kidney disease: Secondary | ICD-10-CM | POA: Diagnosis not present

## 2018-01-02 DIAGNOSIS — N2581 Secondary hyperparathyroidism of renal origin: Secondary | ICD-10-CM | POA: Diagnosis not present

## 2018-01-02 DIAGNOSIS — H5213 Myopia, bilateral: Secondary | ICD-10-CM | POA: Diagnosis not present

## 2018-01-02 DIAGNOSIS — E113293 Type 2 diabetes mellitus with mild nonproliferative diabetic retinopathy without macular edema, bilateral: Secondary | ICD-10-CM | POA: Diagnosis not present

## 2018-01-02 DIAGNOSIS — D509 Iron deficiency anemia, unspecified: Secondary | ICD-10-CM | POA: Diagnosis not present

## 2018-01-03 DIAGNOSIS — Z992 Dependence on renal dialysis: Secondary | ICD-10-CM | POA: Diagnosis not present

## 2018-01-03 DIAGNOSIS — Z23 Encounter for immunization: Secondary | ICD-10-CM | POA: Diagnosis not present

## 2018-01-03 DIAGNOSIS — D509 Iron deficiency anemia, unspecified: Secondary | ICD-10-CM | POA: Diagnosis not present

## 2018-01-03 DIAGNOSIS — N2581 Secondary hyperparathyroidism of renal origin: Secondary | ICD-10-CM | POA: Diagnosis not present

## 2018-01-03 DIAGNOSIS — N186 End stage renal disease: Secondary | ICD-10-CM | POA: Diagnosis not present

## 2018-01-03 DIAGNOSIS — D631 Anemia in chronic kidney disease: Secondary | ICD-10-CM | POA: Diagnosis not present

## 2018-01-04 DIAGNOSIS — Z992 Dependence on renal dialysis: Secondary | ICD-10-CM | POA: Diagnosis not present

## 2018-01-04 DIAGNOSIS — N186 End stage renal disease: Secondary | ICD-10-CM | POA: Diagnosis not present

## 2018-01-04 DIAGNOSIS — D509 Iron deficiency anemia, unspecified: Secondary | ICD-10-CM | POA: Diagnosis not present

## 2018-01-04 DIAGNOSIS — N2581 Secondary hyperparathyroidism of renal origin: Secondary | ICD-10-CM | POA: Diagnosis not present

## 2018-01-04 DIAGNOSIS — D631 Anemia in chronic kidney disease: Secondary | ICD-10-CM | POA: Diagnosis not present

## 2018-01-04 DIAGNOSIS — Z23 Encounter for immunization: Secondary | ICD-10-CM | POA: Diagnosis not present

## 2018-01-05 DIAGNOSIS — N2581 Secondary hyperparathyroidism of renal origin: Secondary | ICD-10-CM | POA: Diagnosis not present

## 2018-01-05 DIAGNOSIS — E119 Type 2 diabetes mellitus without complications: Secondary | ICD-10-CM | POA: Diagnosis not present

## 2018-01-05 DIAGNOSIS — Z992 Dependence on renal dialysis: Secondary | ICD-10-CM | POA: Diagnosis not present

## 2018-01-05 DIAGNOSIS — D509 Iron deficiency anemia, unspecified: Secondary | ICD-10-CM | POA: Diagnosis not present

## 2018-01-05 DIAGNOSIS — D631 Anemia in chronic kidney disease: Secondary | ICD-10-CM | POA: Diagnosis not present

## 2018-01-05 DIAGNOSIS — Z23 Encounter for immunization: Secondary | ICD-10-CM | POA: Diagnosis not present

## 2018-01-05 DIAGNOSIS — N186 End stage renal disease: Secondary | ICD-10-CM | POA: Diagnosis not present

## 2018-01-05 DIAGNOSIS — Z794 Long term (current) use of insulin: Secondary | ICD-10-CM | POA: Diagnosis not present

## 2018-01-06 DIAGNOSIS — Z992 Dependence on renal dialysis: Secondary | ICD-10-CM | POA: Diagnosis not present

## 2018-01-06 DIAGNOSIS — D631 Anemia in chronic kidney disease: Secondary | ICD-10-CM | POA: Diagnosis not present

## 2018-01-06 DIAGNOSIS — D509 Iron deficiency anemia, unspecified: Secondary | ICD-10-CM | POA: Diagnosis not present

## 2018-01-06 DIAGNOSIS — N2581 Secondary hyperparathyroidism of renal origin: Secondary | ICD-10-CM | POA: Diagnosis not present

## 2018-01-06 DIAGNOSIS — N186 End stage renal disease: Secondary | ICD-10-CM | POA: Diagnosis not present

## 2018-01-06 DIAGNOSIS — Z23 Encounter for immunization: Secondary | ICD-10-CM | POA: Diagnosis not present

## 2018-01-07 DIAGNOSIS — N186 End stage renal disease: Secondary | ICD-10-CM | POA: Diagnosis not present

## 2018-01-07 DIAGNOSIS — D509 Iron deficiency anemia, unspecified: Secondary | ICD-10-CM | POA: Diagnosis not present

## 2018-01-07 DIAGNOSIS — D631 Anemia in chronic kidney disease: Secondary | ICD-10-CM | POA: Diagnosis not present

## 2018-01-07 DIAGNOSIS — N2581 Secondary hyperparathyroidism of renal origin: Secondary | ICD-10-CM | POA: Diagnosis not present

## 2018-01-07 DIAGNOSIS — Z23 Encounter for immunization: Secondary | ICD-10-CM | POA: Diagnosis not present

## 2018-01-07 DIAGNOSIS — Z992 Dependence on renal dialysis: Secondary | ICD-10-CM | POA: Diagnosis not present

## 2018-01-08 DIAGNOSIS — N186 End stage renal disease: Secondary | ICD-10-CM | POA: Diagnosis not present

## 2018-01-08 DIAGNOSIS — D631 Anemia in chronic kidney disease: Secondary | ICD-10-CM | POA: Diagnosis not present

## 2018-01-08 DIAGNOSIS — Z23 Encounter for immunization: Secondary | ICD-10-CM | POA: Diagnosis not present

## 2018-01-08 DIAGNOSIS — Z992 Dependence on renal dialysis: Secondary | ICD-10-CM | POA: Diagnosis not present

## 2018-01-08 DIAGNOSIS — D509 Iron deficiency anemia, unspecified: Secondary | ICD-10-CM | POA: Diagnosis not present

## 2018-01-08 DIAGNOSIS — N2581 Secondary hyperparathyroidism of renal origin: Secondary | ICD-10-CM | POA: Diagnosis not present

## 2018-01-09 DIAGNOSIS — D509 Iron deficiency anemia, unspecified: Secondary | ICD-10-CM | POA: Diagnosis not present

## 2018-01-09 DIAGNOSIS — N2581 Secondary hyperparathyroidism of renal origin: Secondary | ICD-10-CM | POA: Diagnosis not present

## 2018-01-09 DIAGNOSIS — Z992 Dependence on renal dialysis: Secondary | ICD-10-CM | POA: Diagnosis not present

## 2018-01-09 DIAGNOSIS — Z23 Encounter for immunization: Secondary | ICD-10-CM | POA: Diagnosis not present

## 2018-01-09 DIAGNOSIS — N186 End stage renal disease: Secondary | ICD-10-CM | POA: Diagnosis not present

## 2018-01-09 DIAGNOSIS — D631 Anemia in chronic kidney disease: Secondary | ICD-10-CM | POA: Diagnosis not present

## 2018-01-10 DIAGNOSIS — Z992 Dependence on renal dialysis: Secondary | ICD-10-CM | POA: Diagnosis not present

## 2018-01-10 DIAGNOSIS — D631 Anemia in chronic kidney disease: Secondary | ICD-10-CM | POA: Diagnosis not present

## 2018-01-10 DIAGNOSIS — N186 End stage renal disease: Secondary | ICD-10-CM | POA: Diagnosis not present

## 2018-01-10 DIAGNOSIS — Z23 Encounter for immunization: Secondary | ICD-10-CM | POA: Diagnosis not present

## 2018-01-10 DIAGNOSIS — N2581 Secondary hyperparathyroidism of renal origin: Secondary | ICD-10-CM | POA: Diagnosis not present

## 2018-01-10 DIAGNOSIS — D509 Iron deficiency anemia, unspecified: Secondary | ICD-10-CM | POA: Diagnosis not present

## 2018-01-11 DIAGNOSIS — N186 End stage renal disease: Secondary | ICD-10-CM | POA: Diagnosis not present

## 2018-01-11 DIAGNOSIS — D631 Anemia in chronic kidney disease: Secondary | ICD-10-CM | POA: Diagnosis not present

## 2018-01-11 DIAGNOSIS — Z23 Encounter for immunization: Secondary | ICD-10-CM | POA: Diagnosis not present

## 2018-01-11 DIAGNOSIS — E113413 Type 2 diabetes mellitus with severe nonproliferative diabetic retinopathy with macular edema, bilateral: Secondary | ICD-10-CM | POA: Diagnosis not present

## 2018-01-11 DIAGNOSIS — D509 Iron deficiency anemia, unspecified: Secondary | ICD-10-CM | POA: Diagnosis not present

## 2018-01-11 DIAGNOSIS — I120 Hypertensive chronic kidney disease with stage 5 chronic kidney disease or end stage renal disease: Secondary | ICD-10-CM | POA: Diagnosis not present

## 2018-01-11 DIAGNOSIS — G4489 Other headache syndrome: Secondary | ICD-10-CM | POA: Diagnosis not present

## 2018-01-11 DIAGNOSIS — Z992 Dependence on renal dialysis: Secondary | ICD-10-CM | POA: Diagnosis not present

## 2018-01-11 DIAGNOSIS — F064 Anxiety disorder due to known physiological condition: Secondary | ICD-10-CM | POA: Diagnosis not present

## 2018-01-11 DIAGNOSIS — N2581 Secondary hyperparathyroidism of renal origin: Secondary | ICD-10-CM | POA: Diagnosis not present

## 2018-01-12 DIAGNOSIS — D509 Iron deficiency anemia, unspecified: Secondary | ICD-10-CM | POA: Diagnosis not present

## 2018-01-12 DIAGNOSIS — E113412 Type 2 diabetes mellitus with severe nonproliferative diabetic retinopathy with macular edema, left eye: Secondary | ICD-10-CM | POA: Diagnosis not present

## 2018-01-12 DIAGNOSIS — H25811 Combined forms of age-related cataract, right eye: Secondary | ICD-10-CM | POA: Diagnosis not present

## 2018-01-12 DIAGNOSIS — Z992 Dependence on renal dialysis: Secondary | ICD-10-CM | POA: Diagnosis not present

## 2018-01-12 DIAGNOSIS — N186 End stage renal disease: Secondary | ICD-10-CM | POA: Diagnosis not present

## 2018-01-12 DIAGNOSIS — Z23 Encounter for immunization: Secondary | ICD-10-CM | POA: Diagnosis not present

## 2018-01-12 DIAGNOSIS — N2581 Secondary hyperparathyroidism of renal origin: Secondary | ICD-10-CM | POA: Diagnosis not present

## 2018-01-12 DIAGNOSIS — E113411 Type 2 diabetes mellitus with severe nonproliferative diabetic retinopathy with macular edema, right eye: Secondary | ICD-10-CM | POA: Diagnosis not present

## 2018-01-12 DIAGNOSIS — D631 Anemia in chronic kidney disease: Secondary | ICD-10-CM | POA: Diagnosis not present

## 2018-01-12 DIAGNOSIS — Z01818 Encounter for other preprocedural examination: Secondary | ICD-10-CM | POA: Diagnosis not present

## 2018-01-12 DIAGNOSIS — E113413 Type 2 diabetes mellitus with severe nonproliferative diabetic retinopathy with macular edema, bilateral: Secondary | ICD-10-CM | POA: Diagnosis not present

## 2018-01-12 DIAGNOSIS — H25812 Combined forms of age-related cataract, left eye: Secondary | ICD-10-CM | POA: Diagnosis not present

## 2018-01-13 DIAGNOSIS — D509 Iron deficiency anemia, unspecified: Secondary | ICD-10-CM | POA: Diagnosis not present

## 2018-01-13 DIAGNOSIS — N186 End stage renal disease: Secondary | ICD-10-CM | POA: Diagnosis not present

## 2018-01-13 DIAGNOSIS — D631 Anemia in chronic kidney disease: Secondary | ICD-10-CM | POA: Diagnosis not present

## 2018-01-13 DIAGNOSIS — N2581 Secondary hyperparathyroidism of renal origin: Secondary | ICD-10-CM | POA: Diagnosis not present

## 2018-01-13 DIAGNOSIS — Z23 Encounter for immunization: Secondary | ICD-10-CM | POA: Diagnosis not present

## 2018-01-13 DIAGNOSIS — Z992 Dependence on renal dialysis: Secondary | ICD-10-CM | POA: Diagnosis not present

## 2018-01-14 DIAGNOSIS — N186 End stage renal disease: Secondary | ICD-10-CM | POA: Diagnosis not present

## 2018-01-14 DIAGNOSIS — Z23 Encounter for immunization: Secondary | ICD-10-CM | POA: Diagnosis not present

## 2018-01-14 DIAGNOSIS — D631 Anemia in chronic kidney disease: Secondary | ICD-10-CM | POA: Diagnosis not present

## 2018-01-14 DIAGNOSIS — N2581 Secondary hyperparathyroidism of renal origin: Secondary | ICD-10-CM | POA: Diagnosis not present

## 2018-01-14 DIAGNOSIS — D509 Iron deficiency anemia, unspecified: Secondary | ICD-10-CM | POA: Diagnosis not present

## 2018-01-14 DIAGNOSIS — Z992 Dependence on renal dialysis: Secondary | ICD-10-CM | POA: Diagnosis not present

## 2018-01-15 DIAGNOSIS — D631 Anemia in chronic kidney disease: Secondary | ICD-10-CM | POA: Diagnosis not present

## 2018-01-15 DIAGNOSIS — N2581 Secondary hyperparathyroidism of renal origin: Secondary | ICD-10-CM | POA: Diagnosis not present

## 2018-01-15 DIAGNOSIS — Z992 Dependence on renal dialysis: Secondary | ICD-10-CM | POA: Diagnosis not present

## 2018-01-15 DIAGNOSIS — D509 Iron deficiency anemia, unspecified: Secondary | ICD-10-CM | POA: Diagnosis not present

## 2018-01-15 DIAGNOSIS — Z23 Encounter for immunization: Secondary | ICD-10-CM | POA: Diagnosis not present

## 2018-01-15 DIAGNOSIS — N186 End stage renal disease: Secondary | ICD-10-CM | POA: Diagnosis not present

## 2018-01-16 DIAGNOSIS — D631 Anemia in chronic kidney disease: Secondary | ICD-10-CM | POA: Diagnosis not present

## 2018-01-16 DIAGNOSIS — D509 Iron deficiency anemia, unspecified: Secondary | ICD-10-CM | POA: Diagnosis not present

## 2018-01-16 DIAGNOSIS — Z992 Dependence on renal dialysis: Secondary | ICD-10-CM | POA: Diagnosis not present

## 2018-01-16 DIAGNOSIS — N186 End stage renal disease: Secondary | ICD-10-CM | POA: Diagnosis not present

## 2018-01-16 DIAGNOSIS — N2581 Secondary hyperparathyroidism of renal origin: Secondary | ICD-10-CM | POA: Diagnosis not present

## 2018-01-16 DIAGNOSIS — Z23 Encounter for immunization: Secondary | ICD-10-CM | POA: Diagnosis not present

## 2018-01-17 DIAGNOSIS — Z23 Encounter for immunization: Secondary | ICD-10-CM | POA: Diagnosis not present

## 2018-01-17 DIAGNOSIS — D509 Iron deficiency anemia, unspecified: Secondary | ICD-10-CM | POA: Diagnosis not present

## 2018-01-17 DIAGNOSIS — Z992 Dependence on renal dialysis: Secondary | ICD-10-CM | POA: Diagnosis not present

## 2018-01-17 DIAGNOSIS — N186 End stage renal disease: Secondary | ICD-10-CM | POA: Diagnosis not present

## 2018-01-17 DIAGNOSIS — N2581 Secondary hyperparathyroidism of renal origin: Secondary | ICD-10-CM | POA: Diagnosis not present

## 2018-01-17 DIAGNOSIS — D631 Anemia in chronic kidney disease: Secondary | ICD-10-CM | POA: Diagnosis not present

## 2018-01-18 DIAGNOSIS — Z992 Dependence on renal dialysis: Secondary | ICD-10-CM | POA: Diagnosis not present

## 2018-01-18 DIAGNOSIS — N2581 Secondary hyperparathyroidism of renal origin: Secondary | ICD-10-CM | POA: Diagnosis not present

## 2018-01-18 DIAGNOSIS — D631 Anemia in chronic kidney disease: Secondary | ICD-10-CM | POA: Diagnosis not present

## 2018-01-18 DIAGNOSIS — N186 End stage renal disease: Secondary | ICD-10-CM | POA: Diagnosis not present

## 2018-01-18 DIAGNOSIS — Z23 Encounter for immunization: Secondary | ICD-10-CM | POA: Diagnosis not present

## 2018-01-18 DIAGNOSIS — D509 Iron deficiency anemia, unspecified: Secondary | ICD-10-CM | POA: Diagnosis not present

## 2018-01-19 DIAGNOSIS — N186 End stage renal disease: Secondary | ICD-10-CM | POA: Diagnosis not present

## 2018-01-19 DIAGNOSIS — D631 Anemia in chronic kidney disease: Secondary | ICD-10-CM | POA: Diagnosis not present

## 2018-01-19 DIAGNOSIS — Z23 Encounter for immunization: Secondary | ICD-10-CM | POA: Diagnosis not present

## 2018-01-19 DIAGNOSIS — D509 Iron deficiency anemia, unspecified: Secondary | ICD-10-CM | POA: Diagnosis not present

## 2018-01-19 DIAGNOSIS — Z992 Dependence on renal dialysis: Secondary | ICD-10-CM | POA: Diagnosis not present

## 2018-01-19 DIAGNOSIS — N2581 Secondary hyperparathyroidism of renal origin: Secondary | ICD-10-CM | POA: Diagnosis not present

## 2018-01-20 DIAGNOSIS — Z992 Dependence on renal dialysis: Secondary | ICD-10-CM | POA: Diagnosis not present

## 2018-01-20 DIAGNOSIS — D631 Anemia in chronic kidney disease: Secondary | ICD-10-CM | POA: Diagnosis not present

## 2018-01-20 DIAGNOSIS — N186 End stage renal disease: Secondary | ICD-10-CM | POA: Diagnosis not present

## 2018-01-20 DIAGNOSIS — N2581 Secondary hyperparathyroidism of renal origin: Secondary | ICD-10-CM | POA: Diagnosis not present

## 2018-01-20 DIAGNOSIS — Z23 Encounter for immunization: Secondary | ICD-10-CM | POA: Diagnosis not present

## 2018-01-20 DIAGNOSIS — D509 Iron deficiency anemia, unspecified: Secondary | ICD-10-CM | POA: Diagnosis not present

## 2018-01-21 DIAGNOSIS — D631 Anemia in chronic kidney disease: Secondary | ICD-10-CM | POA: Diagnosis not present

## 2018-01-21 DIAGNOSIS — N186 End stage renal disease: Secondary | ICD-10-CM | POA: Diagnosis not present

## 2018-01-21 DIAGNOSIS — N2581 Secondary hyperparathyroidism of renal origin: Secondary | ICD-10-CM | POA: Diagnosis not present

## 2018-01-21 DIAGNOSIS — D509 Iron deficiency anemia, unspecified: Secondary | ICD-10-CM | POA: Diagnosis not present

## 2018-01-21 DIAGNOSIS — Z23 Encounter for immunization: Secondary | ICD-10-CM | POA: Diagnosis not present

## 2018-01-21 DIAGNOSIS — Z992 Dependence on renal dialysis: Secondary | ICD-10-CM | POA: Diagnosis not present

## 2018-01-22 DIAGNOSIS — N2581 Secondary hyperparathyroidism of renal origin: Secondary | ICD-10-CM | POA: Diagnosis not present

## 2018-01-22 DIAGNOSIS — Z23 Encounter for immunization: Secondary | ICD-10-CM | POA: Diagnosis not present

## 2018-01-22 DIAGNOSIS — N186 End stage renal disease: Secondary | ICD-10-CM | POA: Diagnosis not present

## 2018-01-22 DIAGNOSIS — Z992 Dependence on renal dialysis: Secondary | ICD-10-CM | POA: Diagnosis not present

## 2018-01-22 DIAGNOSIS — D631 Anemia in chronic kidney disease: Secondary | ICD-10-CM | POA: Diagnosis not present

## 2018-01-22 DIAGNOSIS — D509 Iron deficiency anemia, unspecified: Secondary | ICD-10-CM | POA: Diagnosis not present

## 2018-01-23 DIAGNOSIS — E119 Type 2 diabetes mellitus without complications: Secondary | ICD-10-CM | POA: Diagnosis not present

## 2018-01-23 DIAGNOSIS — N2581 Secondary hyperparathyroidism of renal origin: Secondary | ICD-10-CM | POA: Diagnosis not present

## 2018-01-23 DIAGNOSIS — Z992 Dependence on renal dialysis: Secondary | ICD-10-CM | POA: Diagnosis not present

## 2018-01-23 DIAGNOSIS — Z794 Long term (current) use of insulin: Secondary | ICD-10-CM | POA: Diagnosis not present

## 2018-01-23 DIAGNOSIS — I10 Essential (primary) hypertension: Secondary | ICD-10-CM | POA: Diagnosis not present

## 2018-01-23 DIAGNOSIS — K0889 Other specified disorders of teeth and supporting structures: Secondary | ICD-10-CM | POA: Diagnosis not present

## 2018-01-23 DIAGNOSIS — Z79899 Other long term (current) drug therapy: Secondary | ICD-10-CM | POA: Diagnosis not present

## 2018-01-23 DIAGNOSIS — Z23 Encounter for immunization: Secondary | ICD-10-CM | POA: Diagnosis not present

## 2018-01-23 DIAGNOSIS — R51 Headache: Secondary | ICD-10-CM | POA: Diagnosis not present

## 2018-01-23 DIAGNOSIS — D509 Iron deficiency anemia, unspecified: Secondary | ICD-10-CM | POA: Diagnosis not present

## 2018-01-23 DIAGNOSIS — D631 Anemia in chronic kidney disease: Secondary | ICD-10-CM | POA: Diagnosis not present

## 2018-01-23 DIAGNOSIS — M542 Cervicalgia: Secondary | ICD-10-CM | POA: Diagnosis not present

## 2018-01-23 DIAGNOSIS — Z7982 Long term (current) use of aspirin: Secondary | ICD-10-CM | POA: Diagnosis not present

## 2018-01-23 DIAGNOSIS — K029 Dental caries, unspecified: Secondary | ICD-10-CM | POA: Diagnosis not present

## 2018-01-23 DIAGNOSIS — J029 Acute pharyngitis, unspecified: Secondary | ICD-10-CM | POA: Diagnosis not present

## 2018-01-23 DIAGNOSIS — N186 End stage renal disease: Secondary | ICD-10-CM | POA: Diagnosis not present

## 2018-01-24 DIAGNOSIS — Z992 Dependence on renal dialysis: Secondary | ICD-10-CM | POA: Diagnosis not present

## 2018-01-24 DIAGNOSIS — D631 Anemia in chronic kidney disease: Secondary | ICD-10-CM | POA: Diagnosis not present

## 2018-01-24 DIAGNOSIS — N2581 Secondary hyperparathyroidism of renal origin: Secondary | ICD-10-CM | POA: Diagnosis not present

## 2018-01-24 DIAGNOSIS — Z23 Encounter for immunization: Secondary | ICD-10-CM | POA: Diagnosis not present

## 2018-01-24 DIAGNOSIS — D509 Iron deficiency anemia, unspecified: Secondary | ICD-10-CM | POA: Diagnosis not present

## 2018-01-24 DIAGNOSIS — N186 End stage renal disease: Secondary | ICD-10-CM | POA: Diagnosis not present

## 2018-01-25 DIAGNOSIS — D509 Iron deficiency anemia, unspecified: Secondary | ICD-10-CM | POA: Diagnosis not present

## 2018-01-25 DIAGNOSIS — Z992 Dependence on renal dialysis: Secondary | ICD-10-CM | POA: Diagnosis not present

## 2018-01-25 DIAGNOSIS — Z23 Encounter for immunization: Secondary | ICD-10-CM | POA: Diagnosis not present

## 2018-01-25 DIAGNOSIS — D631 Anemia in chronic kidney disease: Secondary | ICD-10-CM | POA: Diagnosis not present

## 2018-01-25 DIAGNOSIS — N2581 Secondary hyperparathyroidism of renal origin: Secondary | ICD-10-CM | POA: Diagnosis not present

## 2018-01-25 DIAGNOSIS — N186 End stage renal disease: Secondary | ICD-10-CM | POA: Diagnosis not present

## 2018-01-26 DIAGNOSIS — D631 Anemia in chronic kidney disease: Secondary | ICD-10-CM | POA: Diagnosis not present

## 2018-01-26 DIAGNOSIS — N186 End stage renal disease: Secondary | ICD-10-CM | POA: Diagnosis not present

## 2018-01-26 DIAGNOSIS — D509 Iron deficiency anemia, unspecified: Secondary | ICD-10-CM | POA: Diagnosis not present

## 2018-01-26 DIAGNOSIS — Z992 Dependence on renal dialysis: Secondary | ICD-10-CM | POA: Diagnosis not present

## 2018-01-27 DIAGNOSIS — Z992 Dependence on renal dialysis: Secondary | ICD-10-CM | POA: Diagnosis not present

## 2018-01-27 DIAGNOSIS — D631 Anemia in chronic kidney disease: Secondary | ICD-10-CM | POA: Diagnosis not present

## 2018-01-27 DIAGNOSIS — D509 Iron deficiency anemia, unspecified: Secondary | ICD-10-CM | POA: Diagnosis not present

## 2018-01-27 DIAGNOSIS — N186 End stage renal disease: Secondary | ICD-10-CM | POA: Diagnosis not present

## 2018-01-28 DIAGNOSIS — D509 Iron deficiency anemia, unspecified: Secondary | ICD-10-CM | POA: Diagnosis not present

## 2018-01-28 DIAGNOSIS — D631 Anemia in chronic kidney disease: Secondary | ICD-10-CM | POA: Diagnosis not present

## 2018-01-28 DIAGNOSIS — Z992 Dependence on renal dialysis: Secondary | ICD-10-CM | POA: Diagnosis not present

## 2018-01-28 DIAGNOSIS — N186 End stage renal disease: Secondary | ICD-10-CM | POA: Diagnosis not present

## 2018-01-29 DIAGNOSIS — D509 Iron deficiency anemia, unspecified: Secondary | ICD-10-CM | POA: Diagnosis not present

## 2018-01-29 DIAGNOSIS — D631 Anemia in chronic kidney disease: Secondary | ICD-10-CM | POA: Diagnosis not present

## 2018-01-29 DIAGNOSIS — N186 End stage renal disease: Secondary | ICD-10-CM | POA: Diagnosis not present

## 2018-01-29 DIAGNOSIS — Z992 Dependence on renal dialysis: Secondary | ICD-10-CM | POA: Diagnosis not present

## 2018-01-30 DIAGNOSIS — N186 End stage renal disease: Secondary | ICD-10-CM | POA: Diagnosis not present

## 2018-01-30 DIAGNOSIS — D509 Iron deficiency anemia, unspecified: Secondary | ICD-10-CM | POA: Diagnosis not present

## 2018-01-30 DIAGNOSIS — D631 Anemia in chronic kidney disease: Secondary | ICD-10-CM | POA: Diagnosis not present

## 2018-01-30 DIAGNOSIS — Z992 Dependence on renal dialysis: Secondary | ICD-10-CM | POA: Diagnosis not present

## 2018-01-31 DIAGNOSIS — D509 Iron deficiency anemia, unspecified: Secondary | ICD-10-CM | POA: Diagnosis not present

## 2018-01-31 DIAGNOSIS — D631 Anemia in chronic kidney disease: Secondary | ICD-10-CM | POA: Diagnosis not present

## 2018-01-31 DIAGNOSIS — Z992 Dependence on renal dialysis: Secondary | ICD-10-CM | POA: Diagnosis not present

## 2018-01-31 DIAGNOSIS — N186 End stage renal disease: Secondary | ICD-10-CM | POA: Diagnosis not present

## 2018-02-01 DIAGNOSIS — G4489 Other headache syndrome: Secondary | ICD-10-CM | POA: Diagnosis not present

## 2018-02-01 DIAGNOSIS — E113413 Type 2 diabetes mellitus with severe nonproliferative diabetic retinopathy with macular edema, bilateral: Secondary | ICD-10-CM | POA: Diagnosis not present

## 2018-02-01 DIAGNOSIS — Z6838 Body mass index (BMI) 38.0-38.9, adult: Secondary | ICD-10-CM | POA: Diagnosis not present

## 2018-02-01 DIAGNOSIS — D509 Iron deficiency anemia, unspecified: Secondary | ICD-10-CM | POA: Diagnosis not present

## 2018-02-01 DIAGNOSIS — F064 Anxiety disorder due to known physiological condition: Secondary | ICD-10-CM | POA: Diagnosis not present

## 2018-02-01 DIAGNOSIS — N939 Abnormal uterine and vaginal bleeding, unspecified: Secondary | ICD-10-CM | POA: Diagnosis not present

## 2018-02-01 DIAGNOSIS — Z992 Dependence on renal dialysis: Secondary | ICD-10-CM | POA: Diagnosis not present

## 2018-02-01 DIAGNOSIS — D631 Anemia in chronic kidney disease: Secondary | ICD-10-CM | POA: Diagnosis not present

## 2018-02-01 DIAGNOSIS — I120 Hypertensive chronic kidney disease with stage 5 chronic kidney disease or end stage renal disease: Secondary | ICD-10-CM | POA: Diagnosis not present

## 2018-02-01 DIAGNOSIS — N186 End stage renal disease: Secondary | ICD-10-CM | POA: Diagnosis not present

## 2018-02-02 DIAGNOSIS — Z992 Dependence on renal dialysis: Secondary | ICD-10-CM | POA: Diagnosis not present

## 2018-02-02 DIAGNOSIS — D631 Anemia in chronic kidney disease: Secondary | ICD-10-CM | POA: Diagnosis not present

## 2018-02-02 DIAGNOSIS — N186 End stage renal disease: Secondary | ICD-10-CM | POA: Diagnosis not present

## 2018-02-02 DIAGNOSIS — D509 Iron deficiency anemia, unspecified: Secondary | ICD-10-CM | POA: Diagnosis not present

## 2018-02-03 DIAGNOSIS — Z992 Dependence on renal dialysis: Secondary | ICD-10-CM | POA: Diagnosis not present

## 2018-02-03 DIAGNOSIS — D631 Anemia in chronic kidney disease: Secondary | ICD-10-CM | POA: Diagnosis not present

## 2018-02-03 DIAGNOSIS — N186 End stage renal disease: Secondary | ICD-10-CM | POA: Diagnosis not present

## 2018-02-03 DIAGNOSIS — D509 Iron deficiency anemia, unspecified: Secondary | ICD-10-CM | POA: Diagnosis not present

## 2018-02-04 DIAGNOSIS — Z992 Dependence on renal dialysis: Secondary | ICD-10-CM | POA: Diagnosis not present

## 2018-02-04 DIAGNOSIS — D509 Iron deficiency anemia, unspecified: Secondary | ICD-10-CM | POA: Diagnosis not present

## 2018-02-04 DIAGNOSIS — N186 End stage renal disease: Secondary | ICD-10-CM | POA: Diagnosis not present

## 2018-02-04 DIAGNOSIS — D631 Anemia in chronic kidney disease: Secondary | ICD-10-CM | POA: Diagnosis not present

## 2018-02-05 DIAGNOSIS — N186 End stage renal disease: Secondary | ICD-10-CM | POA: Diagnosis not present

## 2018-02-05 DIAGNOSIS — D509 Iron deficiency anemia, unspecified: Secondary | ICD-10-CM | POA: Diagnosis not present

## 2018-02-05 DIAGNOSIS — D631 Anemia in chronic kidney disease: Secondary | ICD-10-CM | POA: Diagnosis not present

## 2018-02-05 DIAGNOSIS — Z992 Dependence on renal dialysis: Secondary | ICD-10-CM | POA: Diagnosis not present

## 2018-02-06 DIAGNOSIS — N186 End stage renal disease: Secondary | ICD-10-CM | POA: Diagnosis not present

## 2018-02-06 DIAGNOSIS — Z992 Dependence on renal dialysis: Secondary | ICD-10-CM | POA: Diagnosis not present

## 2018-02-06 DIAGNOSIS — D631 Anemia in chronic kidney disease: Secondary | ICD-10-CM | POA: Diagnosis not present

## 2018-02-06 DIAGNOSIS — D509 Iron deficiency anemia, unspecified: Secondary | ICD-10-CM | POA: Diagnosis not present

## 2018-02-07 DIAGNOSIS — D631 Anemia in chronic kidney disease: Secondary | ICD-10-CM | POA: Diagnosis not present

## 2018-02-07 DIAGNOSIS — N186 End stage renal disease: Secondary | ICD-10-CM | POA: Diagnosis not present

## 2018-02-07 DIAGNOSIS — D509 Iron deficiency anemia, unspecified: Secondary | ICD-10-CM | POA: Diagnosis not present

## 2018-02-07 DIAGNOSIS — Z992 Dependence on renal dialysis: Secondary | ICD-10-CM | POA: Diagnosis not present

## 2018-02-08 DIAGNOSIS — D509 Iron deficiency anemia, unspecified: Secondary | ICD-10-CM | POA: Diagnosis not present

## 2018-02-08 DIAGNOSIS — Z992 Dependence on renal dialysis: Secondary | ICD-10-CM | POA: Diagnosis not present

## 2018-02-08 DIAGNOSIS — N186 End stage renal disease: Secondary | ICD-10-CM | POA: Diagnosis not present

## 2018-02-08 DIAGNOSIS — D631 Anemia in chronic kidney disease: Secondary | ICD-10-CM | POA: Diagnosis not present

## 2018-02-09 DIAGNOSIS — D509 Iron deficiency anemia, unspecified: Secondary | ICD-10-CM | POA: Diagnosis not present

## 2018-02-09 DIAGNOSIS — N186 End stage renal disease: Secondary | ICD-10-CM | POA: Diagnosis not present

## 2018-02-09 DIAGNOSIS — Z992 Dependence on renal dialysis: Secondary | ICD-10-CM | POA: Diagnosis not present

## 2018-02-09 DIAGNOSIS — D631 Anemia in chronic kidney disease: Secondary | ICD-10-CM | POA: Diagnosis not present

## 2018-02-10 DIAGNOSIS — D631 Anemia in chronic kidney disease: Secondary | ICD-10-CM | POA: Diagnosis not present

## 2018-02-10 DIAGNOSIS — D509 Iron deficiency anemia, unspecified: Secondary | ICD-10-CM | POA: Diagnosis not present

## 2018-02-10 DIAGNOSIS — Z992 Dependence on renal dialysis: Secondary | ICD-10-CM | POA: Diagnosis not present

## 2018-02-10 DIAGNOSIS — N186 End stage renal disease: Secondary | ICD-10-CM | POA: Diagnosis not present

## 2018-02-11 DIAGNOSIS — D509 Iron deficiency anemia, unspecified: Secondary | ICD-10-CM | POA: Diagnosis not present

## 2018-02-11 DIAGNOSIS — D631 Anemia in chronic kidney disease: Secondary | ICD-10-CM | POA: Diagnosis not present

## 2018-02-11 DIAGNOSIS — N186 End stage renal disease: Secondary | ICD-10-CM | POA: Diagnosis not present

## 2018-02-11 DIAGNOSIS — Z992 Dependence on renal dialysis: Secondary | ICD-10-CM | POA: Diagnosis not present

## 2018-02-12 DIAGNOSIS — Z992 Dependence on renal dialysis: Secondary | ICD-10-CM | POA: Diagnosis not present

## 2018-02-12 DIAGNOSIS — D631 Anemia in chronic kidney disease: Secondary | ICD-10-CM | POA: Diagnosis not present

## 2018-02-12 DIAGNOSIS — N186 End stage renal disease: Secondary | ICD-10-CM | POA: Diagnosis not present

## 2018-02-12 DIAGNOSIS — D509 Iron deficiency anemia, unspecified: Secondary | ICD-10-CM | POA: Diagnosis not present

## 2018-02-13 DIAGNOSIS — D509 Iron deficiency anemia, unspecified: Secondary | ICD-10-CM | POA: Diagnosis not present

## 2018-02-13 DIAGNOSIS — D631 Anemia in chronic kidney disease: Secondary | ICD-10-CM | POA: Diagnosis not present

## 2018-02-13 DIAGNOSIS — N186 End stage renal disease: Secondary | ICD-10-CM | POA: Diagnosis not present

## 2018-02-13 DIAGNOSIS — Z992 Dependence on renal dialysis: Secondary | ICD-10-CM | POA: Diagnosis not present

## 2018-02-14 DIAGNOSIS — Z992 Dependence on renal dialysis: Secondary | ICD-10-CM | POA: Diagnosis not present

## 2018-02-14 DIAGNOSIS — D631 Anemia in chronic kidney disease: Secondary | ICD-10-CM | POA: Diagnosis not present

## 2018-02-14 DIAGNOSIS — D509 Iron deficiency anemia, unspecified: Secondary | ICD-10-CM | POA: Diagnosis not present

## 2018-02-14 DIAGNOSIS — N186 End stage renal disease: Secondary | ICD-10-CM | POA: Diagnosis not present

## 2018-02-15 DIAGNOSIS — Z992 Dependence on renal dialysis: Secondary | ICD-10-CM | POA: Diagnosis not present

## 2018-02-15 DIAGNOSIS — D509 Iron deficiency anemia, unspecified: Secondary | ICD-10-CM | POA: Diagnosis not present

## 2018-02-15 DIAGNOSIS — N186 End stage renal disease: Secondary | ICD-10-CM | POA: Diagnosis not present

## 2018-02-15 DIAGNOSIS — D631 Anemia in chronic kidney disease: Secondary | ICD-10-CM | POA: Diagnosis not present

## 2018-02-16 DIAGNOSIS — D509 Iron deficiency anemia, unspecified: Secondary | ICD-10-CM | POA: Diagnosis not present

## 2018-02-16 DIAGNOSIS — N186 End stage renal disease: Secondary | ICD-10-CM | POA: Diagnosis not present

## 2018-02-16 DIAGNOSIS — D631 Anemia in chronic kidney disease: Secondary | ICD-10-CM | POA: Diagnosis not present

## 2018-02-16 DIAGNOSIS — Z992 Dependence on renal dialysis: Secondary | ICD-10-CM | POA: Diagnosis not present

## 2018-02-17 DIAGNOSIS — D631 Anemia in chronic kidney disease: Secondary | ICD-10-CM | POA: Diagnosis not present

## 2018-02-17 DIAGNOSIS — D509 Iron deficiency anemia, unspecified: Secondary | ICD-10-CM | POA: Diagnosis not present

## 2018-02-17 DIAGNOSIS — N186 End stage renal disease: Secondary | ICD-10-CM | POA: Diagnosis not present

## 2018-02-17 DIAGNOSIS — Z992 Dependence on renal dialysis: Secondary | ICD-10-CM | POA: Diagnosis not present

## 2018-02-18 DIAGNOSIS — Z992 Dependence on renal dialysis: Secondary | ICD-10-CM | POA: Diagnosis not present

## 2018-02-18 DIAGNOSIS — D631 Anemia in chronic kidney disease: Secondary | ICD-10-CM | POA: Diagnosis not present

## 2018-02-18 DIAGNOSIS — D509 Iron deficiency anemia, unspecified: Secondary | ICD-10-CM | POA: Diagnosis not present

## 2018-02-18 DIAGNOSIS — N186 End stage renal disease: Secondary | ICD-10-CM | POA: Diagnosis not present

## 2018-02-19 DIAGNOSIS — Z992 Dependence on renal dialysis: Secondary | ICD-10-CM | POA: Diagnosis not present

## 2018-02-19 DIAGNOSIS — D509 Iron deficiency anemia, unspecified: Secondary | ICD-10-CM | POA: Diagnosis not present

## 2018-02-19 DIAGNOSIS — N186 End stage renal disease: Secondary | ICD-10-CM | POA: Diagnosis not present

## 2018-02-19 DIAGNOSIS — D631 Anemia in chronic kidney disease: Secondary | ICD-10-CM | POA: Diagnosis not present

## 2018-02-20 DIAGNOSIS — D631 Anemia in chronic kidney disease: Secondary | ICD-10-CM | POA: Diagnosis not present

## 2018-02-20 DIAGNOSIS — Z992 Dependence on renal dialysis: Secondary | ICD-10-CM | POA: Diagnosis not present

## 2018-02-20 DIAGNOSIS — N186 End stage renal disease: Secondary | ICD-10-CM | POA: Diagnosis not present

## 2018-02-20 DIAGNOSIS — D509 Iron deficiency anemia, unspecified: Secondary | ICD-10-CM | POA: Diagnosis not present

## 2018-02-21 DIAGNOSIS — D631 Anemia in chronic kidney disease: Secondary | ICD-10-CM | POA: Diagnosis not present

## 2018-02-21 DIAGNOSIS — D509 Iron deficiency anemia, unspecified: Secondary | ICD-10-CM | POA: Diagnosis not present

## 2018-02-21 DIAGNOSIS — N186 End stage renal disease: Secondary | ICD-10-CM | POA: Diagnosis not present

## 2018-02-21 DIAGNOSIS — Z992 Dependence on renal dialysis: Secondary | ICD-10-CM | POA: Diagnosis not present

## 2018-02-22 DIAGNOSIS — Z992 Dependence on renal dialysis: Secondary | ICD-10-CM | POA: Diagnosis not present

## 2018-02-22 DIAGNOSIS — D631 Anemia in chronic kidney disease: Secondary | ICD-10-CM | POA: Diagnosis not present

## 2018-02-22 DIAGNOSIS — N186 End stage renal disease: Secondary | ICD-10-CM | POA: Diagnosis not present

## 2018-02-22 DIAGNOSIS — D509 Iron deficiency anemia, unspecified: Secondary | ICD-10-CM | POA: Diagnosis not present

## 2018-02-23 ENCOUNTER — Emergency Department (HOSPITAL_COMMUNITY): Payer: Medicare Other

## 2018-02-23 ENCOUNTER — Emergency Department (HOSPITAL_COMMUNITY)
Admission: EM | Admit: 2018-02-23 | Discharge: 2018-02-23 | Disposition: A | Discharge status: Home / Self Care | Payer: Medicare Other | Attending: Emergency Medicine | Admitting: Emergency Medicine

## 2018-02-23 ENCOUNTER — Other Ambulatory Visit: Payer: Self-pay

## 2018-02-23 ENCOUNTER — Encounter (HOSPITAL_COMMUNITY): Payer: Self-pay | Admitting: Oncology

## 2018-02-23 DIAGNOSIS — Z96642 Presence of left artificial hip joint: Secondary | ICD-10-CM | POA: Diagnosis not present

## 2018-02-23 DIAGNOSIS — J209 Acute bronchitis, unspecified: Secondary | ICD-10-CM | POA: Insufficient documentation

## 2018-02-23 DIAGNOSIS — Z79899 Other long term (current) drug therapy: Secondary | ICD-10-CM | POA: Diagnosis not present

## 2018-02-23 DIAGNOSIS — R0989 Other specified symptoms and signs involving the circulatory and respiratory systems: Secondary | ICD-10-CM | POA: Diagnosis present

## 2018-02-23 DIAGNOSIS — I132 Hypertensive heart and chronic kidney disease with heart failure and with stage 5 chronic kidney disease, or end stage renal disease: Secondary | ICD-10-CM | POA: Insufficient documentation

## 2018-02-23 DIAGNOSIS — I509 Heart failure, unspecified: Secondary | ICD-10-CM | POA: Diagnosis not present

## 2018-02-23 DIAGNOSIS — R911 Solitary pulmonary nodule: Secondary | ICD-10-CM | POA: Diagnosis not present

## 2018-02-23 DIAGNOSIS — Z7982 Long term (current) use of aspirin: Secondary | ICD-10-CM | POA: Diagnosis not present

## 2018-02-23 DIAGNOSIS — N186 End stage renal disease: Secondary | ICD-10-CM | POA: Diagnosis not present

## 2018-02-23 DIAGNOSIS — E1122 Type 2 diabetes mellitus with diabetic chronic kidney disease: Secondary | ICD-10-CM | POA: Diagnosis not present

## 2018-02-23 DIAGNOSIS — K8020 Calculus of gallbladder without cholecystitis without obstruction: Secondary | ICD-10-CM

## 2018-02-23 DIAGNOSIS — R1011 Right upper quadrant pain: Secondary | ICD-10-CM | POA: Diagnosis not present

## 2018-02-23 DIAGNOSIS — Z794 Long term (current) use of insulin: Secondary | ICD-10-CM | POA: Insufficient documentation

## 2018-02-23 DIAGNOSIS — D631 Anemia in chronic kidney disease: Secondary | ICD-10-CM | POA: Diagnosis not present

## 2018-02-23 DIAGNOSIS — R1010 Upper abdominal pain, unspecified: Secondary | ICD-10-CM

## 2018-02-23 DIAGNOSIS — Z992 Dependence on renal dialysis: Secondary | ICD-10-CM | POA: Diagnosis not present

## 2018-02-23 DIAGNOSIS — R05 Cough: Secondary | ICD-10-CM | POA: Diagnosis not present

## 2018-02-23 DIAGNOSIS — D509 Iron deficiency anemia, unspecified: Secondary | ICD-10-CM | POA: Diagnosis not present

## 2018-02-23 LAB — INFLUENZA PANEL BY PCR (TYPE A & B)
Influenza A By PCR: NEGATIVE
Influenza B By PCR: NEGATIVE

## 2018-02-23 LAB — CBC WITH DIFFERENTIAL/PLATELET
ABS IMMATURE GRANULOCYTES: 0.11 10*3/uL — AB (ref 0.00–0.07)
BASOS ABS: 0.1 10*3/uL (ref 0.0–0.1)
Basophils Relative: 1 %
EOS PCT: 4 %
Eosinophils Absolute: 0.3 10*3/uL (ref 0.0–0.5)
HCT: 29 % — ABNORMAL LOW (ref 36.0–46.0)
HEMOGLOBIN: 8.5 g/dL — AB (ref 12.0–15.0)
IMMATURE GRANULOCYTES: 1 %
LYMPHS ABS: 1.3 10*3/uL (ref 0.7–4.0)
LYMPHS PCT: 16 %
MCH: 23 pg — ABNORMAL LOW (ref 26.0–34.0)
MCHC: 29.3 g/dL — ABNORMAL LOW (ref 30.0–36.0)
MCV: 78.6 fL — ABNORMAL LOW (ref 80.0–100.0)
Monocytes Absolute: 1.1 10*3/uL — ABNORMAL HIGH (ref 0.1–1.0)
Monocytes Relative: 13 %
NEUTROS ABS: 5.5 10*3/uL (ref 1.7–7.7)
NEUTROS PCT: 65 %
Platelets: 189 10*3/uL (ref 150–400)
RBC: 3.69 MIL/uL — ABNORMAL LOW (ref 3.87–5.11)
RDW: 17.8 % — ABNORMAL HIGH (ref 11.5–15.5)
WBC: 8.3 10*3/uL (ref 4.0–10.5)
nRBC: 0 % (ref 0.0–0.2)

## 2018-02-23 LAB — BRAIN NATRIURETIC PEPTIDE: B NATRIURETIC PEPTIDE 5: 656.8 pg/mL — AB (ref 0.0–100.0)

## 2018-02-23 LAB — COMPREHENSIVE METABOLIC PANEL
ALBUMIN: 2.6 g/dL — AB (ref 3.5–5.0)
ALT: 14 U/L (ref 0–44)
AST: 16 U/L (ref 15–41)
Alkaline Phosphatase: 51 U/L (ref 38–126)
Anion gap: 16 — ABNORMAL HIGH (ref 5–15)
BUN: 64 mg/dL — AB (ref 6–20)
CHLORIDE: 95 mmol/L — AB (ref 98–111)
CO2: 24 mmol/L (ref 22–32)
CREATININE: 14 mg/dL — AB (ref 0.44–1.00)
Calcium: 8.4 mg/dL — ABNORMAL LOW (ref 8.9–10.3)
GFR calc Af Amer: 3 mL/min — ABNORMAL LOW (ref >60)
GFR, EST NON AFRICAN AMERICAN: 3 mL/min — AB (ref >60)
Glucose, Bld: 108 mg/dL — ABNORMAL HIGH (ref 70–99)
POTASSIUM: 4.3 mmol/L (ref 3.5–5.1)
SODIUM: 135 mmol/L (ref 135–145)
Total Bilirubin: 0.6 mg/dL (ref 0.3–1.2)
Total Protein: 6.5 g/dL (ref 6.5–8.1)

## 2018-02-23 LAB — LIPASE, BLOOD: Lipase: 42 U/L (ref 11–51)

## 2018-02-23 LAB — I-STAT TROPONIN, ED: Troponin i, poc: 0.02 ng/mL (ref 0.00–0.08)

## 2018-02-23 LAB — I-STAT BETA HCG BLOOD, ED (MC, WL, AP ONLY)

## 2018-02-23 IMAGING — DX DG CHEST 2V
2 series · 2 of 2 positions shown · non-contrast
Comparison: [DATE]

CLINICAL DATA: Cough

EXAM:
CHEST - 2 VIEW

[w chest pa]
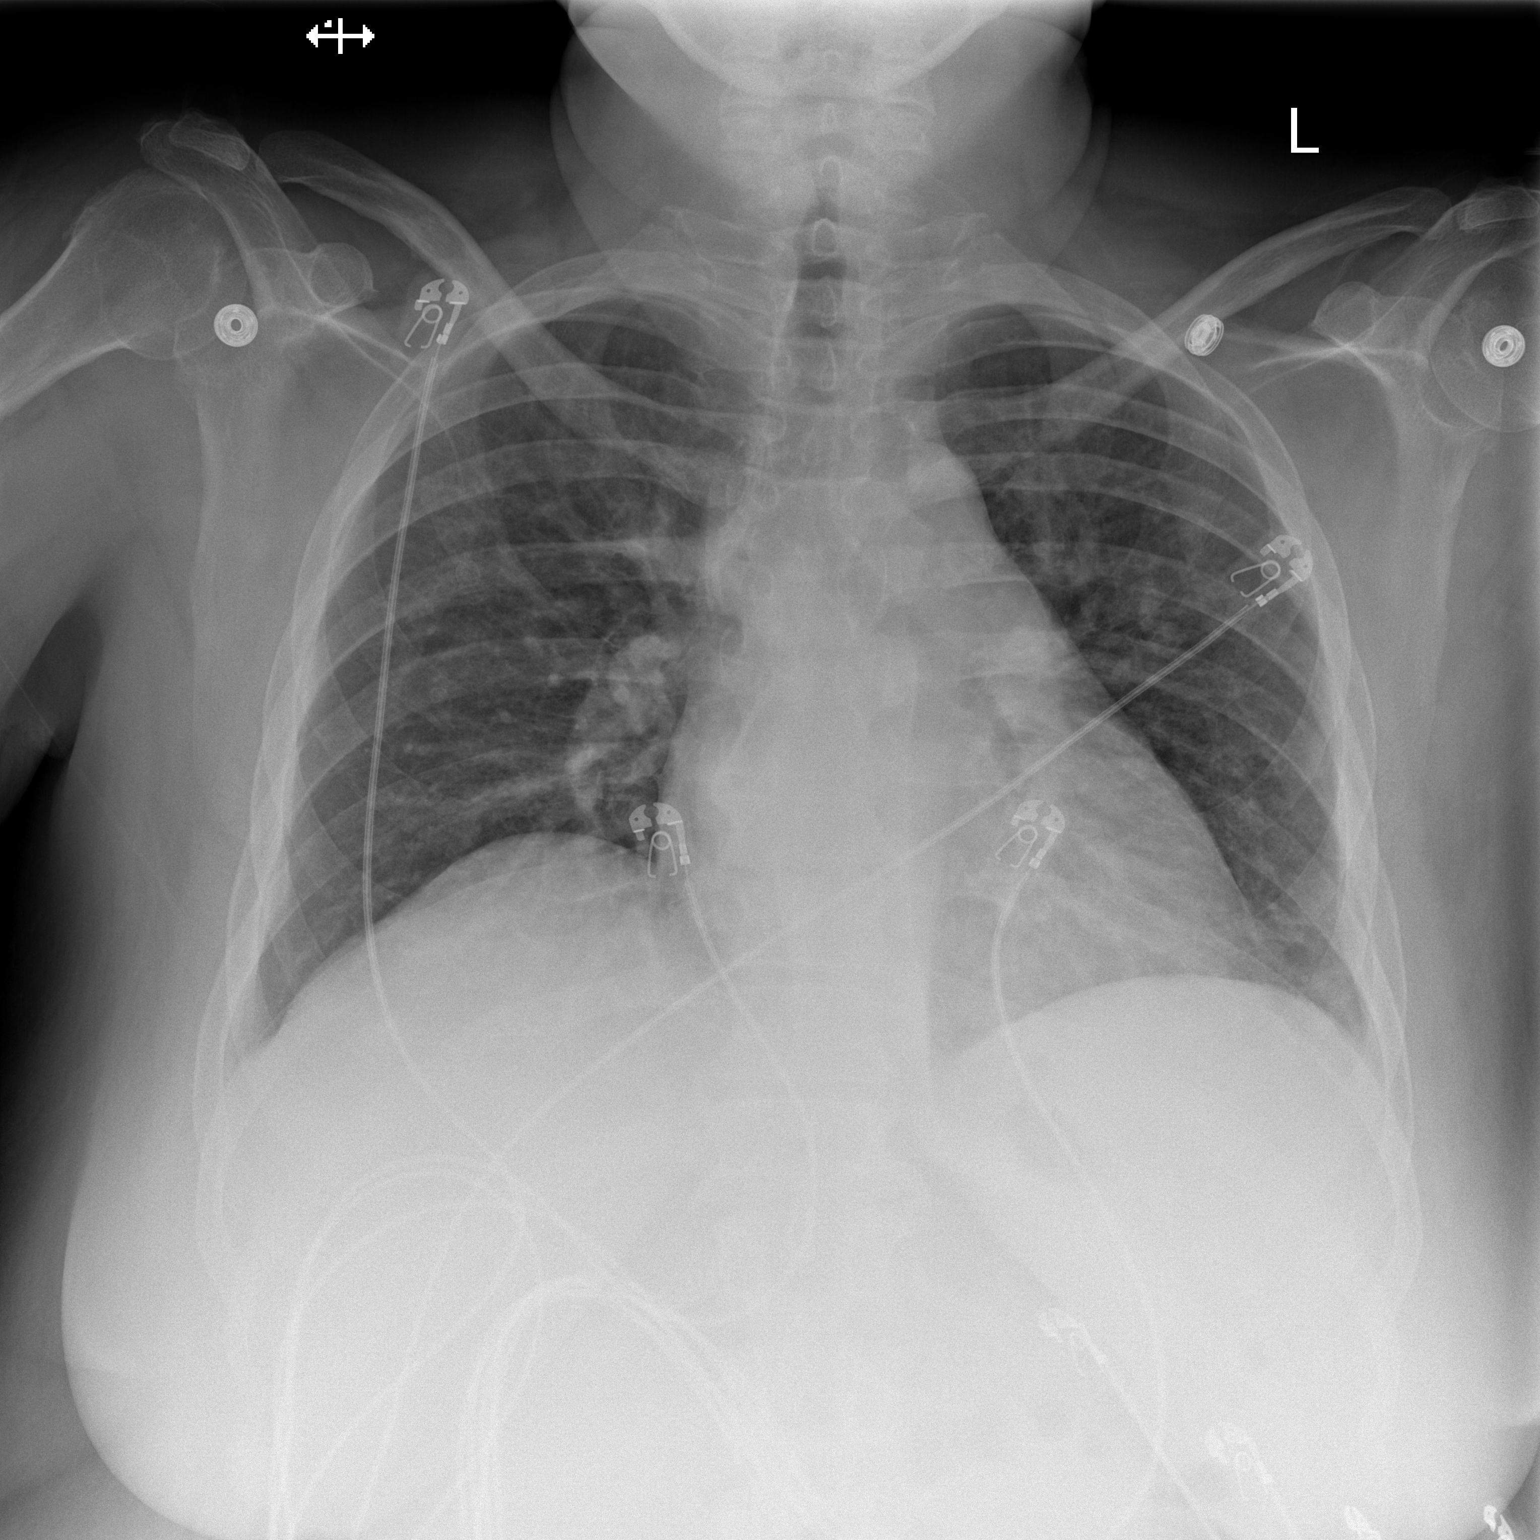

[w chest lat]
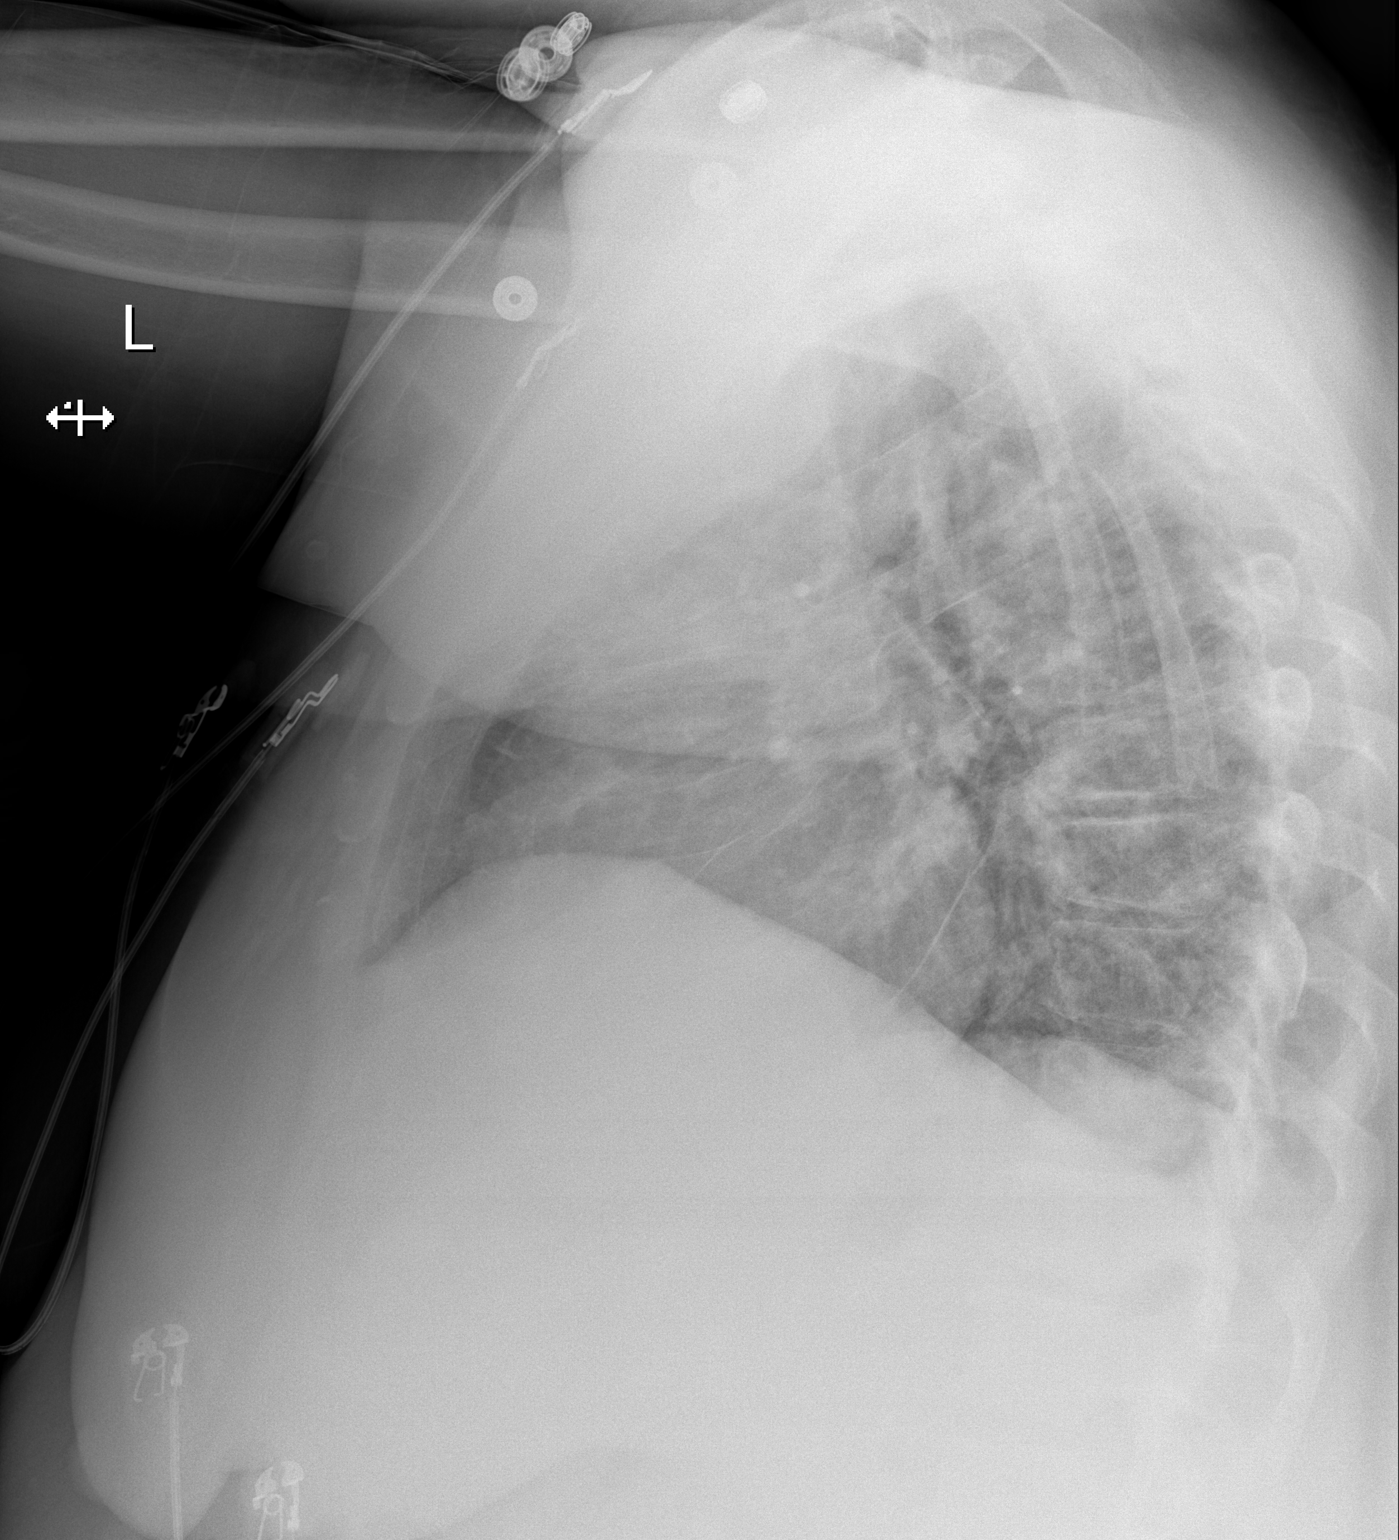

[2 of 2 positions shown; findings below may reference images not displayed]

FINDINGS: The heart size and mediastinal contours are within normal limits.
Both lungs are clear. The visualized skeletal structures are
unremarkable.
IMPRESSION: No active cardiopulmonary disease.

## 2018-02-23 IMAGING — CT CT ABD-PELV W/O CM
2 of 4 series · 16 of 46 positions shown, 18 images · non-contrast
Comparison: None

CLINICAL DATA: Right upper quadrant pain for 1 week.

EXAM:
CT ABDOMEN AND PELVIS WITHOUT CONTRAST
TECHNIQUE: Multidetector CT imaging of the abdomen and pelvis was performed
following the standard protocol without IV contrast.

[Series 3: a/p w/o 5mm · axial · non-contrast · 0.97mm/px · z∈[+762,+1237]mm · 13 of 105 slices shown, 15 images]
[im 5/105  soft-tissue]
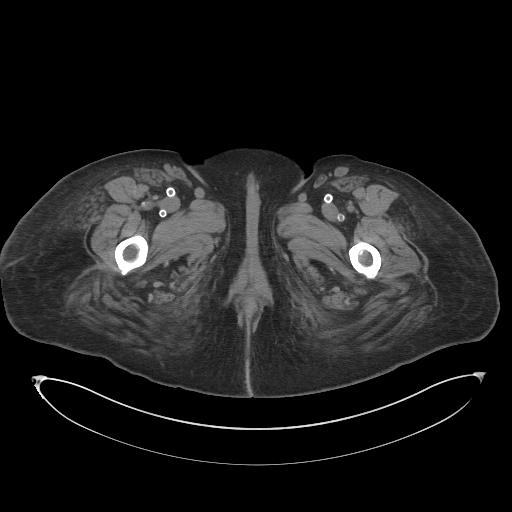
[im 5/105  bone]
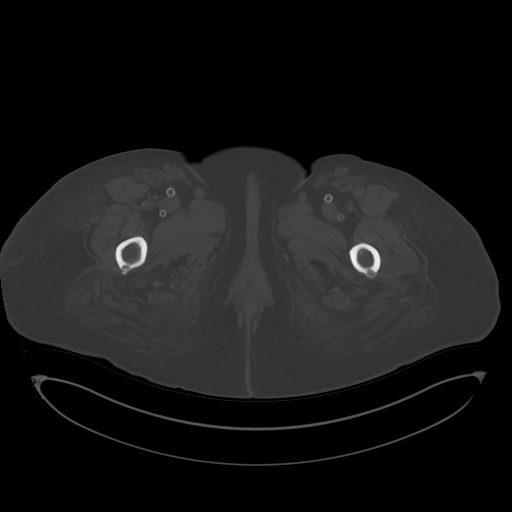
[im 14/105  soft-tissue]
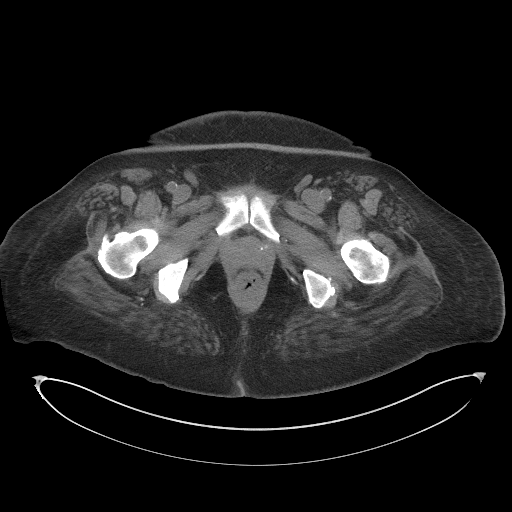
[im 22/105  soft-tissue]
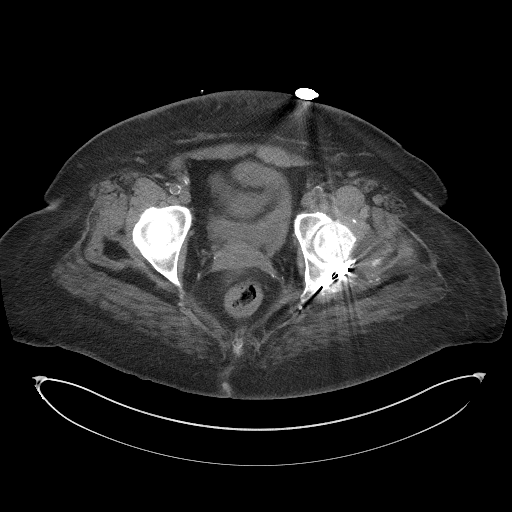
[im 31/105  soft-tissue]
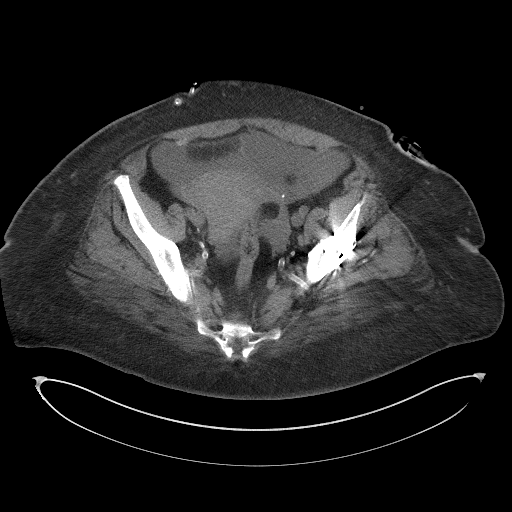
[im 35/105  soft-tissue]
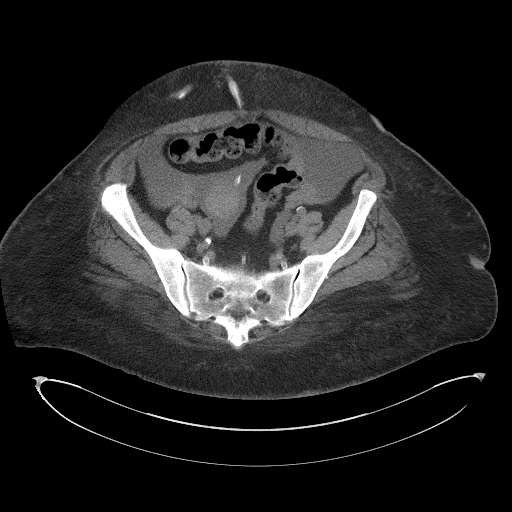
[im 44/105  soft-tissue]
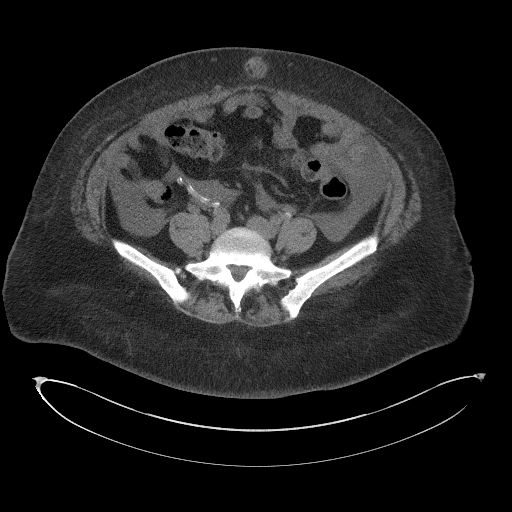
[im 53/105  soft-tissue]
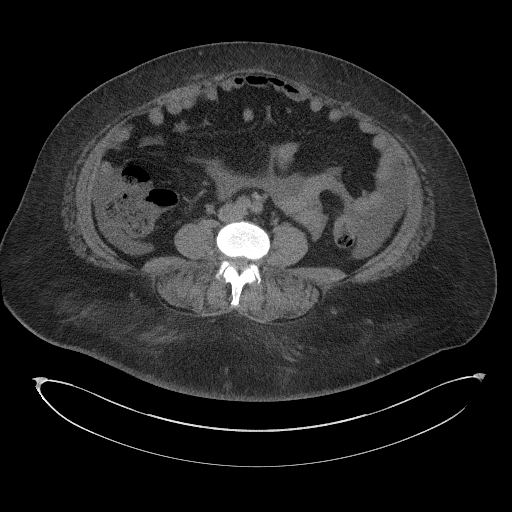
[im 61/105  soft-tissue]
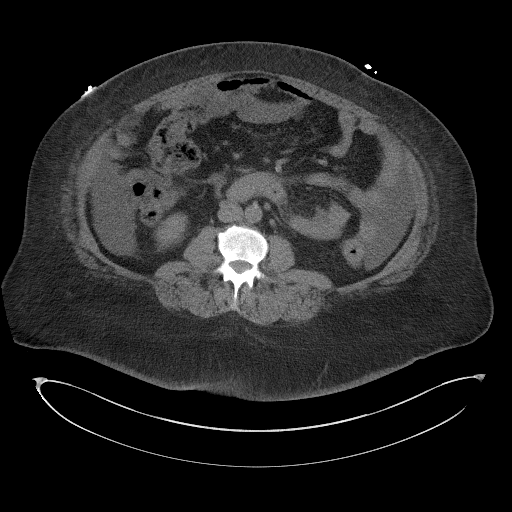
[im 70/105  soft-tissue]
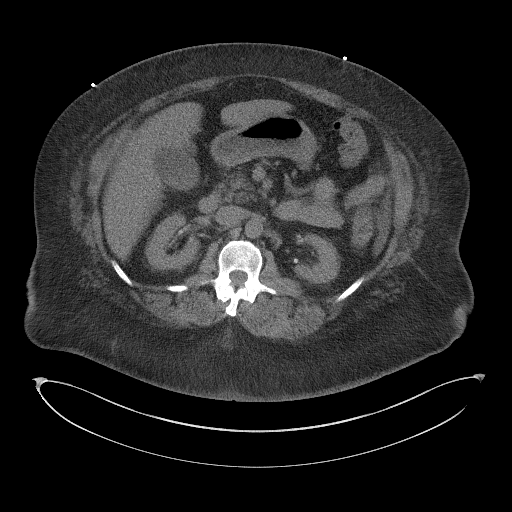
[im 70/105  bone]
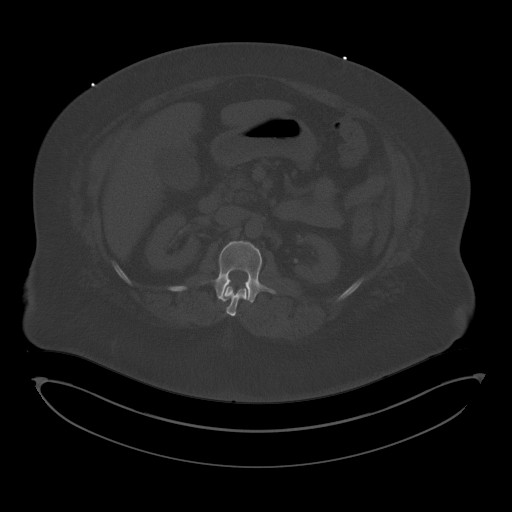
[im 74/105  soft-tissue]
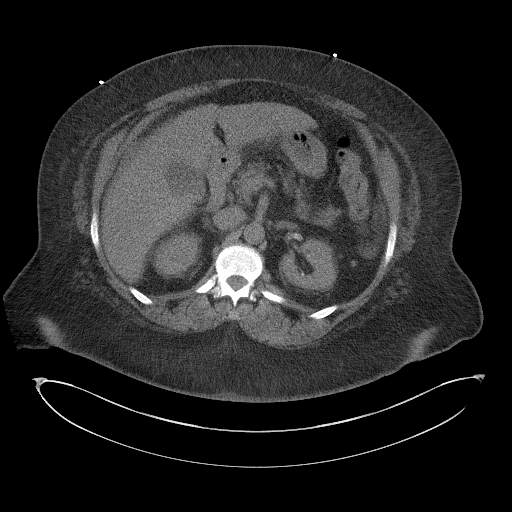
[im 83/105  soft-tissue]
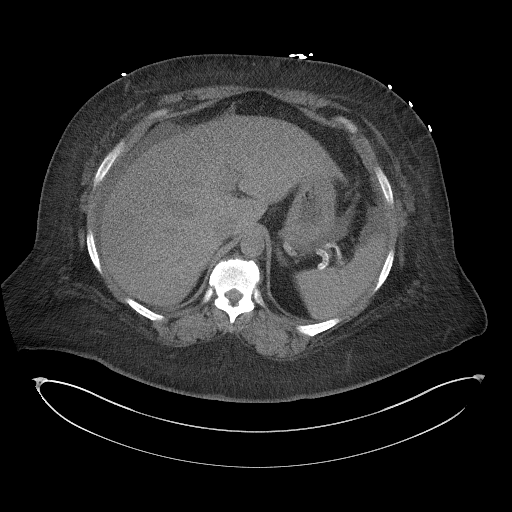
[im 92/105  soft-tissue]
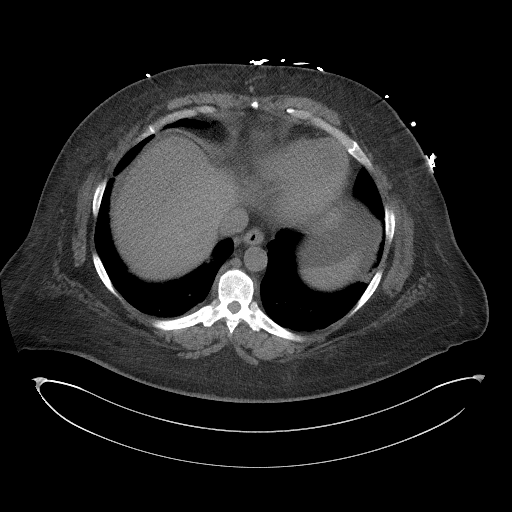
[im 100/105  soft-tissue]
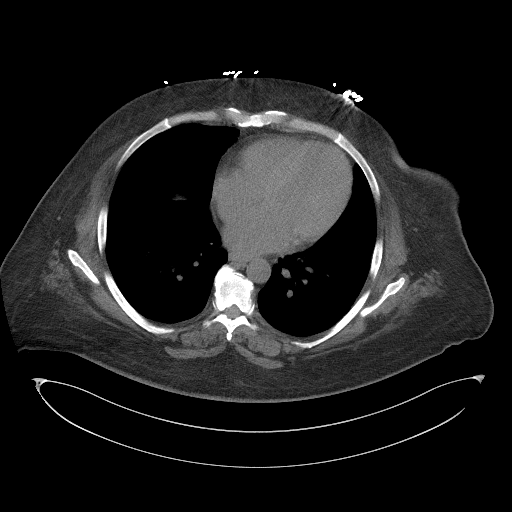

[Series 6: a/p w/o cor · coronal · non-contrast · 1.02mm/px · 3 of 184 slices shown]
[im 62/184  soft-tissue]
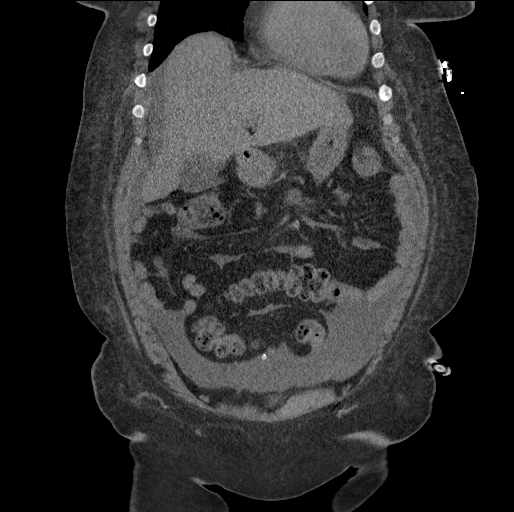
[im 82/184  soft-tissue]
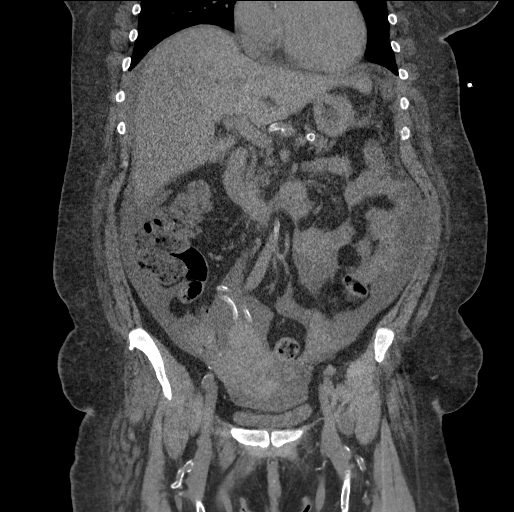
[im 102/184  soft-tissue]
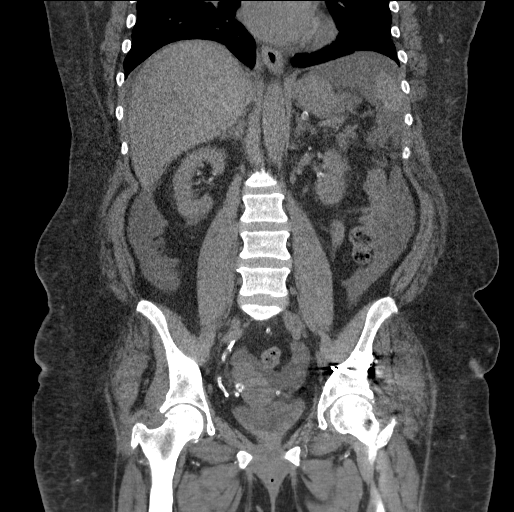

[16 of 46 positions shown; findings below may reference images not displayed]

FINDINGS: Lower chest: Several small nodules are identified within the lower
lobes. Index nodule in the left lower lobe measuring 5 mm, image
[DATE].

Hepatobiliary: Within the limitations of unenhanced technique. No
focal liver abnormality identified. Small stones noted within the
dependent portion of the gallbladder. No gallbladder wall thickening
identified. No biliary ductal dilatation.

Pancreas: Unremarkable. No pancreatic ductal dilatation or
surrounding inflammatory changes.

Spleen: Normal in size without focal abnormality.

Adrenals/Urinary Tract: Normal appearance of the adrenal glands.
Mild bilateral renal atrophy identified. Urinary bladder appears
normal for degree of distention.

Stomach/Bowel: Stomach appears normal. The small bowel loops have a
normal caliber. The appendix is visualized and appears within normal
limits. No pathologic dilatation of the colon.

Vascular/Lymphatic: Vascular calcifications are identified involving
the branch vessels off the aorta. Mild aortic atherosclerosis. No
adenopathy.

Reproductive: No adnexal mass noted.

Other: There is a peritoneal dialysis catheter identified which is
coiled in the right iliac fossa. Moderate free fluid within the
abdomen and pelvis noted compatible with dialysis fluid. No focal
fluid collections identified. There is a umbilical hernia which
contains fat only.

Musculoskeletal: Previous hardware fixation of the left hemipelvis.
No acute or significant osseous abnormality.
IMPRESSION: 1. No acute findings identified within the abdomen or pelvis.
2. Gallstones noted.
3. Peritoneal dialysis catheter noted with moderate fluid in the
abdomen and pelvis.
4. Several small nonspecific pulmonary nodules are identified within
the visualized lung bases. These measure up to 4 mm. No follow-up
needed if patient is low-risk (and has no known or suspected primary
neoplasm). Non-contrast chest CT can be considered in 12 months if
patient is high-risk. This recommendation follows the consensus
statement: Guidelines for Management of Incidental Pulmonary Nodules
Detected on CT Images: From the [HOSPITAL] [8W]; Radiology

## 2018-02-23 MED ORDER — HYDROCODONE-ACETAMINOPHEN 5-325 MG PO TABS: 1.0000 | ORAL_TABLET | ORAL | 0 refills | Status: DC | PRN

## 2018-02-23 MED ORDER — HYDROMORPHONE HCL 1 MG/ML IJ SOLN
1.0000 mg | Freq: Once | INTRAMUSCULAR | Status: AC
  Administered 2018-02-23: 1 mg via INTRAVENOUS
  Filled 2018-02-23: qty 1

## 2018-02-23 MED ORDER — ONDANSETRON 4 MG PO TBDP: 4.0000 mg | ORAL_TABLET | Freq: Three times a day (TID) | ORAL | 0 refills | Status: DC | PRN

## 2018-02-23 MED ORDER — ALBUTEROL SULFATE (2.5 MG/3ML) 0.083% IN NEBU
5.0000 mg | INHALATION_SOLUTION | Freq: Once | RESPIRATORY_TRACT | Status: AC
  Administered 2018-02-23: 5 mg via RESPIRATORY_TRACT
  Filled 2018-02-23: qty 6

## 2018-02-23 MED ORDER — ALBUTEROL SULFATE HFA 108 (90 BASE) MCG/ACT IN AERS
2.0000 | INHALATION_SPRAY | Freq: Once | RESPIRATORY_TRACT | Status: AC
  Administered 2018-02-23: 2 via RESPIRATORY_TRACT
  Filled 2018-02-23: qty 6.7

## 2018-02-23 NOTE — ED Notes (Signed)
Called lab. Lab will add on BNP

## 2018-02-23 NOTE — ED Notes (Signed)
Spoke with dialysis RN, states she will be unavailable to drawn specimen until possibly 4am, MD notified.

## 2018-02-23 NOTE — Discharge Instructions (Addendum)
If you develop fever, vomiting, worsening or uncontrolled abdominal pain, shortness of breath, chest pain, or any other new/concerning symptoms and return to the ER for evaluation.  You may use the albuterol inhaler 1-2 puffs every 4 hours as needed for shortness of breath or cough.

## 2018-02-23 NOTE — ED Triage Notes (Signed)
Pt reports cough x one month.  Pt endorses generalized body aches, congestion, chills x 2 weeks.  Pt reports, "I would feel better for a couple days think I was getting better then get worse again."

## 2018-02-23 NOTE — ED Provider Notes (Signed)
[facility] EMERGENCY DEPARTMENT Provider Note   CSN: [id number] Arrival date & time: 02/23/18  1952     History   Chief Complaint Chief Complaint  Patient presents with  . Flu Like Sx    HPI Janet Mitchell is a 52 y.o. female.  HPI  52 year old female with a history of diabetes, end-stage renal disease, presents with concern for flulike symptoms.  She states she is been feeling ill for about 2 or 3 weeks.  Has been coming and going.  She will start to improve and then the symptoms will worsen again.  She is been having a combination of chest congestion with cough, shortness of breath, wheezing, chest pain, abdominal pain and headache.  No fever but has been having chills.  Over the last 2 days is been getting worse again.  She is been taking some over-the-counter medicine such as Vicks and DayQuil.  The abdominal pain seems to be worse today and she had some vomiting after having a coughing spell.  She is on peritoneal dialysis and last did dialysis last night.  Her pain is in her right upper abdomen/right back.  She urinates little but has not noticed any new urinary changes.  Past Medical History:  Diagnosis Date  . Anemia   . CHF (congestive heart failure) ([place])   . Diabetes mellitus without complication ([place])   . Headache   . Hypertension   . Renal disorder     Patient Active Problem List   Diagnosis Date Noted  . Symptomatic anemia 11/06/2017  . Hypertension 11/06/2017  . ESRD on peritoneal dialysis ([place]) 11/06/2017    Past Surgical History:  Procedure Laterality Date  . AMPUTATION TOE Left    Great left toe 2013.  Marland Kitchen CESAREAN SECTION     x2. 1994 and 1998  . FLEXIBLE SIGMOIDOSCOPY N/A 11/24/2017   Procedure: FLEXIBLE SIGMOIDOSCOPY;  Surgeon: Daneil Dolin, MD;  Location: [facility];  Service: Endoscopy;  Laterality: N/A;  . TOTAL HIP ARTHROPLASTY Left 1997     OB History   None      Home Medications    Prior to Admission  medications   Medication Sig Start Date End Date Taking? Authorizing Provider  amLODipine (NORVASC) 10 MG tablet Take 1 tablet (10 mg total) by mouth daily. 11/09/17  Yes Emokpae, Courage, MD  aspirin EC 81 MG tablet Take 81 mg by mouth at bedtime.    Yes [provider]  calcitRIOL (ROCALTROL) 0.5 MCG capsule Take 0.5-1 mcg by mouth See admin instructions. 0.24mcg on Mondays through Fridays and take 48mcg on Saturdays and Sundays   Yes [provider]  doxazosin (CARDURA) 4 MG tablet Take 4 mg by mouth at bedtime.   Yes [provider]  Insulin Glargine (TOUJEO MAX SOLOSTAR) 300 UNIT/ML SOPN Inject 14 Units into the skin at bedtime. 11/09/17  Yes Emokpae, Courage, MD  labetalol (NORMODYNE) 200 MG tablet Take 200 mg by mouth 2 (two) times daily.   Yes [provider]  torsemide (DEMADEX) 100 MG tablet Take 1 tablet (100 mg total) by mouth at bedtime. 11/09/17  Yes Emokpae, Courage, MD  HYDROcodone-acetaminophen (NORCO) 5-325 MG tablet Take 1 tablet by mouth every 4 (four) hours as needed for severe pain. 02/23/18   Sherwood Gambler, MD  metoprolol succinate (TOPROL-XL) 100 MG 24 hr tablet Take 1 tablet (100 mg total) by mouth daily. Take with or immediately following a meal. Patient not taking: Reported on 02/23/2018 11/09/17  Roxan Hockey, MD  ondansetron (ZOFRAN ODT) 4 MG disintegrating tablet Take 1 tablet (4 mg total) by mouth every 8 (eight) hours as needed for nausea or vomiting. 02/23/18   Sherwood Gambler, MD  polyethylene glycol-electrolytes (TRILYTE) 420 g solution Take 4,000 mLs by mouth as directed. Patient not taking: Reported on 02/23/2018 10/30/17   Carlis Stable, NP    Family History Family History  Problem Relation Age of Onset  . Heart disease Mother   . Thrombocytopenia Mother        TTP  . Heart failure Father   . Kidney disease Paternal Grandfather     Social History Social History   Tobacco Use  . Smoking status: Former Research scientist (life sciences)  .  Smokeless tobacco: Never Used  Substance Use Topics  . Alcohol use: Not Currently  . Drug use: Not Currently     Allergies   Patient has no known allergies.   Review of Systems Review of Systems  Constitutional: Positive for chills. Negative for fever.  HENT: Positive for congestion. Negative for sore throat.   Respiratory: Positive for cough and shortness of breath.   Cardiovascular: Positive for chest pain and leg swelling (chronic, unchanged).  Gastrointestinal: Positive for abdominal pain and vomiting.  Genitourinary: Negative for dysuria.  Musculoskeletal: Positive for back pain.  Neurological: Positive for headaches.  All other systems reviewed and are negative.    Physical Exam Updated Vital Signs BP (!) 171/71   Pulse 76   Temp 98.2 F (36.8 C) (Oral)   Resp 17   Ht 5\' 8"  (1.727 m)   Wt 113.4 kg   LMP 01/26/2018 (Approximate)   SpO2 95%   BMI 38.01 kg/m   Physical Exam  Constitutional: She appears well-developed and well-nourished. No distress.  obese  HENT:  Head: Normocephalic and atraumatic.  Right Ear: External ear normal.  Left Ear: External ear normal.  Nose: Nose normal.  Mouth/Throat: No oropharyngeal exudate.  Eyes: Right eye exhibits no discharge. Left eye exhibits no discharge.  Cardiovascular: Normal rate, regular rhythm and normal heart sounds.  Pulmonary/Chest: Effort normal. No tachypnea. No respiratory distress. She has decreased breath sounds in the right lower field. She has no wheezes. She has no rhonchi. She has no rales.  Abdominal: Soft. There is tenderness in the right upper quadrant and epigastric area.  Musculoskeletal: She exhibits edema (BLE, feet/ankles).  Neurological: She is alert.  Skin: Skin is warm and dry. She is not diaphoretic.  Psychiatric: Her mood appears not anxious.  Nursing note and vitals reviewed.    ED Treatments / Results  Labs (all labs ordered are listed, but only abnormal results are  displayed) Labs Reviewed  COMPREHENSIVE METABOLIC PANEL - Abnormal; Notable for the following components:      Result Value   Chloride 95 (*)    Glucose, Bld 108 (*)    BUN 64 (*)    Creatinine, Ser 14.00 (*)    Calcium 8.4 (*)    Albumin 2.6 (*)    GFR calc non Af Amer 3 (*)    GFR calc Af Amer 3 (*)    Anion gap 16 (*)    All other components within normal limits  CBC WITH DIFFERENTIAL/PLATELET - Abnormal; Notable for the following components:   RBC 3.69 (*)    Hemoglobin 8.5 (*)    HCT 29.0 (*)    MCV 78.6 (*)    MCH 23.0 (*)    MCHC 29.3 (*)    RDW 17.8 (*)  Monocytes Absolute 1.1 (*)    Abs Immature Granulocytes 0.11 (*)    All other components within normal limits  BRAIN NATRIURETIC PEPTIDE - Abnormal; Notable for the following components:   B Natriuretic Peptide 656.8 (*)    All other components within normal limits  GRAM STAIN  BODY FLUID CULTURE  CULTURE, BLOOD (ROUTINE X 2)  CULTURE, BLOOD (ROUTINE X 2)  LIPASE, BLOOD  INFLUENZA PANEL BY PCR (TYPE A & B)  URINALYSIS, ROUTINE W REFLEX MICROSCOPIC  BODY FLUID CELL COUNT WITH DIFFERENTIAL  I-STAT TROPONIN, ED  I-STAT BETA HCG BLOOD, ED ([facility], [facility], [facility] ONLY)    EKG EKG Interpretation  Date/Time:  Friday February 23 2018 20:40:04 EST Ventricular Rate:  79 PR Interval:    QRS Duration: 91 QT Interval:  394 QTC Calculation: 452 R Axis:   -30 Text Interpretation:  Sinus rhythm Left axis deviation Confirmed by Sherwood Gambler [phone]) on 02/23/2018 9:14:47 PM   Radiology Ct Abdomen Pelvis Wo Contrast  Result Date: 02/23/2018 CLINICAL DATA:  Right upper quadrant pain for 1 week. EXAM: CT ABDOMEN AND PELVIS WITHOUT CONTRAST TECHNIQUE: Multidetector CT imaging of the abdomen and pelvis was performed following the standard protocol without IV contrast. COMPARISON:  None FINDINGS: Lower chest: Several small nodules are identified within the lower lobes. Index nodule in the left lower lobe measuring 5 mm, image 6/4.  Hepatobiliary: Within the limitations of unenhanced technique. No focal liver abnormality identified. Small stones noted within the dependent portion of the gallbladder. No gallbladder wall thickening identified. No biliary ductal dilatation. Pancreas: Unremarkable. No pancreatic ductal dilatation or surrounding inflammatory changes. Spleen: Normal in size without focal abnormality. Adrenals/Urinary Tract: Normal appearance of the adrenal glands. Mild bilateral renal atrophy identified. Urinary bladder appears normal for degree of distention. Stomach/Bowel: Stomach appears normal. The small bowel loops have a normal caliber. The appendix is visualized and appears within normal limits. No pathologic dilatation of the colon. Vascular/Lymphatic: Vascular calcifications are identified involving the branch vessels off the aorta. Mild aortic atherosclerosis. No adenopathy. Reproductive: No adnexal mass noted. Other: There is a peritoneal dialysis catheter identified which is coiled in the right iliac fossa. Moderate free fluid within the abdomen and pelvis noted compatible with dialysis fluid. No focal fluid collections identified. There is a umbilical hernia which contains fat only. Musculoskeletal: Previous hardware fixation of the left hemipelvis. No acute or significant osseous abnormality. IMPRESSION: 1. No acute findings identified within the abdomen or pelvis. 2. Gallstones noted. 3. Peritoneal dialysis catheter noted with moderate fluid in the abdomen and pelvis. 4. Several small nonspecific pulmonary nodules are identified within the visualized lung bases. These measure up to 4 mm. No follow-up needed if patient is low-risk (and has no known or suspected primary neoplasm). Non-contrast chest CT can be considered in 12 months if patient is high-risk. This recommendation follows the consensus statement: Guidelines for Management of Incidental Pulmonary Nodules Detected on CT Images: From the Fleischner Society  2017; Radiology 2017; 284:228-243. Electronically Signed   By: Kerby Moors M.D.   On: 02/23/2018 21:56   Dg Chest 2 View  Result Date: 02/23/2018 CLINICAL DATA:  Cough EXAM: CHEST - 2 VIEW COMPARISON:  11/07/2017 FINDINGS: The heart size and mediastinal contours are within normal limits. Both lungs are clear. The visualized skeletal structures are unremarkable. IMPRESSION: No active cardiopulmonary disease. Electronically Signed   By: Ulyses Jarred M.D.   On: 02/23/2018 21:22   US Abdomen Limited Ruq  Result Date: 02/23/2018 CLINICAL DATA:  Right upper quadrant pain EXAM: ULTRASOUND ABDOMEN LIMITED RIGHT UPPER QUADRANT COMPARISON:  None. FINDINGS: Gallbladder: Small stones measuring up to 6 mm. Normal wall thickness. Negative sonographic Murphy. Common bile duct: Diameter: 2.6 mm Liver: Increased hepatic echogenicity without focal abnormality. Portal vein is patent on color Doppler imaging with normal direction of blood flow towards the liver. IMPRESSION: Difficult study secondary to habitus. Cholelithiasis without sonographic evidence for acute cholecystitis or biliary dilatation. Increased hepatic echogenicity suggesting steatosis Electronically Signed   By: Donavan Foil M.D.   On: 02/23/2018 21:13    Procedures Procedures (including critical care time)  Medications Ordered in ED Medications  HYDROmorphone (DILAUDID) injection 1 mg (1 mg Intravenous Given 02/23/18 2036)  albuterol (PROVENTIL) (2.5 MG/3ML) 0.083% nebulizer solution 5 mg (5 mg Nebulization Given 02/23/18 2042)  albuterol (PROVENTIL HFA;VENTOLIN HFA) 108 (90 Base) MCG/ACT inhaler 2 puff (2 puffs Inhalation Given 02/23/18 2249)     Initial Impression / Assessment and Plan / ED Course  I have reviewed the triage vital signs and the nursing notes.  Pertinent labs & imaging results that were available during my care of the patient were reviewed by me and considered in my medical decision making (see chart for details).       Patient is feeling completely better after a dose of IV pain medicine and albuterol.  The right-sided abdominal pain is probably from cholelithiasis.  Given no fever, normal WBC and ultrasound findings I doubt cholecystitis.  Given her history of peritoneal dialysis and the catheter on the right side, I wanted to get cell count, Gram stain, and culture from her peritoneal fluid.  She has not noticed any cloudiness or discoloration to the dialysate.  However the dialysis nurse is unable to do this until about 4 AM according to nurse.  I discussed this with patient.  She prefers to go home.  I do think peritonitis is less likely.  However we discussed the limitations of not testing this fluid.  She and family understand.  At this point they prefer to go home which I think is reasonable.  There is no obvious bacterial infection at this time so I do not think antibiotics will be helpful.  We discussed strict return precautions as well as the finding of pulmonary nodule on her CT.  Final Clinical Impressions(s) / ED Diagnoses   Final diagnoses:  Upper abdominal pain  Acute bronchitis, unspecified organism  Calculus of gallbladder without cholecystitis without obstruction  Pulmonary nodule    ED Discharge Orders         Ordered    HYDROcodone-acetaminophen (NORCO) 5-325 MG tablet  Every 4 hours PRN     02/23/18 2322    ondansetron (ZOFRAN ODT) 4 MG disintegrating tablet  Every 8 hours PRN     02/23/18 2322           Sherwood Gambler, MD 02/23/18 2325

## 2018-02-23 NOTE — ED Notes (Signed)
Patient transported to CT 

## 2018-02-24 DIAGNOSIS — D509 Iron deficiency anemia, unspecified: Secondary | ICD-10-CM | POA: Diagnosis not present

## 2018-02-24 DIAGNOSIS — N186 End stage renal disease: Secondary | ICD-10-CM | POA: Diagnosis not present

## 2018-02-24 DIAGNOSIS — D631 Anemia in chronic kidney disease: Secondary | ICD-10-CM | POA: Diagnosis not present

## 2018-02-24 DIAGNOSIS — Z992 Dependence on renal dialysis: Secondary | ICD-10-CM | POA: Diagnosis not present

## 2018-02-25 DIAGNOSIS — D509 Iron deficiency anemia, unspecified: Secondary | ICD-10-CM | POA: Diagnosis not present

## 2018-02-25 DIAGNOSIS — N186 End stage renal disease: Secondary | ICD-10-CM | POA: Diagnosis not present

## 2018-02-25 DIAGNOSIS — D631 Anemia in chronic kidney disease: Secondary | ICD-10-CM | POA: Diagnosis not present

## 2018-02-25 DIAGNOSIS — N2581 Secondary hyperparathyroidism of renal origin: Secondary | ICD-10-CM | POA: Diagnosis not present

## 2018-02-25 DIAGNOSIS — Z992 Dependence on renal dialysis: Secondary | ICD-10-CM | POA: Diagnosis not present

## 2018-02-26 DIAGNOSIS — N2581 Secondary hyperparathyroidism of renal origin: Secondary | ICD-10-CM | POA: Diagnosis not present

## 2018-02-26 DIAGNOSIS — D631 Anemia in chronic kidney disease: Secondary | ICD-10-CM | POA: Diagnosis not present

## 2018-02-26 DIAGNOSIS — Z992 Dependence on renal dialysis: Secondary | ICD-10-CM | POA: Diagnosis not present

## 2018-02-26 DIAGNOSIS — D509 Iron deficiency anemia, unspecified: Secondary | ICD-10-CM | POA: Diagnosis not present

## 2018-02-26 DIAGNOSIS — N186 End stage renal disease: Secondary | ICD-10-CM | POA: Diagnosis not present

## 2018-02-27 DIAGNOSIS — D631 Anemia in chronic kidney disease: Secondary | ICD-10-CM | POA: Diagnosis not present

## 2018-02-27 DIAGNOSIS — Z992 Dependence on renal dialysis: Secondary | ICD-10-CM | POA: Diagnosis not present

## 2018-02-27 DIAGNOSIS — N2581 Secondary hyperparathyroidism of renal origin: Secondary | ICD-10-CM | POA: Diagnosis not present

## 2018-02-27 DIAGNOSIS — D509 Iron deficiency anemia, unspecified: Secondary | ICD-10-CM | POA: Diagnosis not present

## 2018-02-27 DIAGNOSIS — N186 End stage renal disease: Secondary | ICD-10-CM | POA: Diagnosis not present

## 2018-02-28 DIAGNOSIS — N186 End stage renal disease: Secondary | ICD-10-CM | POA: Diagnosis not present

## 2018-02-28 DIAGNOSIS — N2581 Secondary hyperparathyroidism of renal origin: Secondary | ICD-10-CM | POA: Diagnosis not present

## 2018-02-28 DIAGNOSIS — Z992 Dependence on renal dialysis: Secondary | ICD-10-CM | POA: Diagnosis not present

## 2018-02-28 DIAGNOSIS — D509 Iron deficiency anemia, unspecified: Secondary | ICD-10-CM | POA: Diagnosis not present

## 2018-02-28 DIAGNOSIS — D631 Anemia in chronic kidney disease: Secondary | ICD-10-CM | POA: Diagnosis not present

## 2018-02-28 LAB — CULTURE, BLOOD (ROUTINE X 2)
Culture: NO GROWTH
Culture: NO GROWTH
SPECIAL REQUESTS: ADEQUATE
Special Requests: ADEQUATE

## 2018-03-01 DIAGNOSIS — N2581 Secondary hyperparathyroidism of renal origin: Secondary | ICD-10-CM | POA: Diagnosis not present

## 2018-03-01 DIAGNOSIS — Z992 Dependence on renal dialysis: Secondary | ICD-10-CM | POA: Diagnosis not present

## 2018-03-01 DIAGNOSIS — N186 End stage renal disease: Secondary | ICD-10-CM | POA: Diagnosis not present

## 2018-03-01 DIAGNOSIS — D631 Anemia in chronic kidney disease: Secondary | ICD-10-CM | POA: Diagnosis not present

## 2018-03-01 DIAGNOSIS — D509 Iron deficiency anemia, unspecified: Secondary | ICD-10-CM | POA: Diagnosis not present

## 2018-03-02 ENCOUNTER — Ambulatory Visit: Payer: Medicare Other | Admitting: Gastroenterology

## 2018-03-02 DIAGNOSIS — N2581 Secondary hyperparathyroidism of renal origin: Secondary | ICD-10-CM | POA: Diagnosis not present

## 2018-03-02 DIAGNOSIS — N186 End stage renal disease: Secondary | ICD-10-CM | POA: Diagnosis not present

## 2018-03-02 DIAGNOSIS — D631 Anemia in chronic kidney disease: Secondary | ICD-10-CM | POA: Diagnosis not present

## 2018-03-02 DIAGNOSIS — Z992 Dependence on renal dialysis: Secondary | ICD-10-CM | POA: Diagnosis not present

## 2018-03-02 DIAGNOSIS — D509 Iron deficiency anemia, unspecified: Secondary | ICD-10-CM | POA: Diagnosis not present

## 2018-03-03 DIAGNOSIS — D631 Anemia in chronic kidney disease: Secondary | ICD-10-CM | POA: Diagnosis not present

## 2018-03-03 DIAGNOSIS — D509 Iron deficiency anemia, unspecified: Secondary | ICD-10-CM | POA: Diagnosis not present

## 2018-03-03 DIAGNOSIS — Z992 Dependence on renal dialysis: Secondary | ICD-10-CM | POA: Diagnosis not present

## 2018-03-03 DIAGNOSIS — N186 End stage renal disease: Secondary | ICD-10-CM | POA: Diagnosis not present

## 2018-03-03 DIAGNOSIS — N2581 Secondary hyperparathyroidism of renal origin: Secondary | ICD-10-CM | POA: Diagnosis not present

## 2018-03-04 DIAGNOSIS — N186 End stage renal disease: Secondary | ICD-10-CM | POA: Diagnosis not present

## 2018-03-04 DIAGNOSIS — Z992 Dependence on renal dialysis: Secondary | ICD-10-CM | POA: Diagnosis not present

## 2018-03-04 DIAGNOSIS — N2581 Secondary hyperparathyroidism of renal origin: Secondary | ICD-10-CM | POA: Diagnosis not present

## 2018-03-04 DIAGNOSIS — D631 Anemia in chronic kidney disease: Secondary | ICD-10-CM | POA: Diagnosis not present

## 2018-03-04 DIAGNOSIS — D509 Iron deficiency anemia, unspecified: Secondary | ICD-10-CM | POA: Diagnosis not present

## 2018-03-05 ENCOUNTER — Ambulatory Visit: Payer: Self-pay | Admitting: Surgery

## 2018-03-05 DIAGNOSIS — D509 Iron deficiency anemia, unspecified: Secondary | ICD-10-CM | POA: Diagnosis not present

## 2018-03-05 DIAGNOSIS — D631 Anemia in chronic kidney disease: Secondary | ICD-10-CM | POA: Diagnosis not present

## 2018-03-05 DIAGNOSIS — K8010 Calculus of gallbladder with chronic cholecystitis without obstruction: Secondary | ICD-10-CM | POA: Diagnosis not present

## 2018-03-05 DIAGNOSIS — E1122 Type 2 diabetes mellitus with diabetic chronic kidney disease: Secondary | ICD-10-CM | POA: Diagnosis not present

## 2018-03-05 DIAGNOSIS — Z992 Dependence on renal dialysis: Secondary | ICD-10-CM | POA: Diagnosis not present

## 2018-03-05 DIAGNOSIS — N2581 Secondary hyperparathyroidism of renal origin: Secondary | ICD-10-CM | POA: Diagnosis not present

## 2018-03-05 DIAGNOSIS — N186 End stage renal disease: Secondary | ICD-10-CM | POA: Diagnosis not present

## 2018-03-05 NOTE — H&P (Signed)
History of Present Illness Janet Mitchell. Trig Mcbryar MD; 03/05/2018 3:31 PM) The patient is a 52 year old female who presents for evaluation of gall stones. PCP - Dr. Lucianne Lei Renal - Dr. Lowanda Foster - Linna Hoff  This is a 52 year old female with DM and ESRD on peritoneal dialysis who presents with recent onset of severe epigastric and RUQ abdominal pain. She presented to the ED on 11/29 with shortness of breath, as well as abdominal pain in the RUQ and right flank. She was diagnosed with cholelithiasis as well as an upper respiratory illness. Liver function tests were normal.  She had her peritoneal dialysis catheter placed several years ago in [place]: Right upper quadrant pain for 1 week.  EXAM: CT ABDOMEN AND PELVIS WITHOUT CONTRAST  TECHNIQUE: Multidetector CT imaging of the abdomen and pelvis was performed following the standard protocol without IV contrast.  COMPARISON: None  FINDINGS: Lower chest: Several small nodules are identified within the lower lobes. Index nodule in the left lower lobe measuring 5 mm, image 6/4.  Hepatobiliary: Within the limitations of unenhanced technique. No focal liver abnormality identified. Small stones noted within the dependent portion of the gallbladder. No gallbladder wall thickening identified. No biliary ductal dilatation.  Pancreas: Unremarkable. No pancreatic ductal dilatation or surrounding inflammatory changes.  Spleen: Normal in size without focal abnormality.  Adrenals/Urinary Tract: Normal appearance of the adrenal glands. Mild bilateral renal atrophy identified. Urinary bladder appears normal for degree of distention.  Stomach/Bowel: Stomach appears normal. The small bowel loops have a normal caliber. The appendix is visualized and appears within normal limits. No pathologic dilatation of the colon.  Vascular/Lymphatic: Vascular calcifications are identified involving the branch vessels off the aorta. Mild  aortic atherosclerosis. No adenopathy.  Reproductive: No adnexal mass noted.  Other: There is a peritoneal dialysis catheter identified which is coiled in the right iliac fossa. Moderate free fluid within the abdomen and pelvis noted compatible with dialysis fluid. No focal fluid collections identified. There is a umbilical hernia which contains fat only.  Musculoskeletal: Previous hardware fixation of the left hemipelvis. No acute or significant osseous abnormality.  IMPRESSION: 1. No acute findings identified within the abdomen or pelvis. 2. Gallstones noted. 3. Peritoneal dialysis catheter noted with moderate fluid in the abdomen and pelvis. 4. Several small nonspecific pulmonary nodules are identified within the visualized lung bases. These measure up to 4 mm. No follow-up needed if patient is low-risk (and has no known or suspected primary neoplasm). Non-contrast chest CT can be considered in 12 months if patient is high-risk. This recommendation follows the consensus statement: Guidelines for Management of Incidental Pulmonary Nodules Detected on CT Images: From the Fleischner Society 2017; Radiology 2017; 284:228-243.   Electronically Signed By: Kerby Moors M.D. On: 02/23/2018 21:56  CLINICAL DATA: Right upper quadrant pain  EXAM: ULTRASOUND ABDOMEN LIMITED RIGHT UPPER QUADRANT  COMPARISON: None.  FINDINGS: Gallbladder:  Small stones measuring up to 6 mm. Normal wall thickness. Negative sonographic Murphy.  Common bile duct:  Diameter: 2.6 mm  Liver:  Increased hepatic echogenicity without focal abnormality. Portal vein is patent on color Doppler imaging with normal direction of blood flow towards the liver.  IMPRESSION: Difficult study secondary to habitus. Cholelithiasis without sonographic evidence for acute cholecystitis or biliary dilatation. Increased hepatic echogenicity suggesting steatosis   Electronically Signed By: Donavan Foil  M.D. On: 02/23/2018 21:13   Allergies Emeline Gins, [place]; 03/05/2018 10:21 AM) No Known Drug Allergies [03/05/2018]: Allergies Reconciled  Medication History (  Emeline Gins, CMA; 03/05/2018 10:26 AM) HYDROcodone-Acetaminophen (10-300MG  Tablet, Oral) Active. Albuterol (90MCG/ACT Aerosol Soln, Inhalation) Active. Aspirin (81MG  Tablet DR, Oral) Active. Furosemide (80MG  Tablet, Oral) Active. amLODIPine Besy-Benazepril HCl (10-20MG  Capsule, Oral) Active. Medications Reconciled    Vitals Emeline Gins CMA; 03/05/2018 10:21 AM) 03/05/2018 10:20 AM Weight: 258.25 lb Height: 68in Body Surface Area: 2.28 m Body Mass Index: 39.27 kg/m  Temp.: 97.27F  Pulse: 79 (Regular)  BP: 148/82 (Sitting, Left Arm, Standard)      Physical Exam Rodman Key K. Lynnae Ludemann MD; 03/05/2018 3:31 PM)  The physical exam findings are as follows: Note:WDWN in NAD Eyes: Pupils equal, round; sclera anicteric HENT: Oral mucosa moist; good dentition Neck: No masses palpated, no thyromegaly Lungs: CTA bilaterally; normal respiratory effort CV: Regular rate and rhythm; no murmurs; extremities well-perfused with no edema Abd: +bowel sounds, soft, tender in RUQ around to R flank, no palpable organomegaly; no palpable hernias Right lower quadrant PD catheter - no sign of infection at the insertion site Skin: Warm, dry; no sign of jaundice Psychiatric - alert and oriented x 4; calm mood and affect    Assessment & Plan Rodman Key K. Chinwe Lope MD; 03/05/2018 3:32 PM)  CHRONIC CHOLECYSTITIS WITH CALCULUS (K80.10)   END-STAGE RENAL DISEASE ON PERITONEAL DIALYSIS (N18.6)   DIABETES MELLITUS WITH ESRD (END-STAGE RENAL DISEASE) (E11.22)  Current Plans Schedule for Surgery - Laparoscopic cholecystectomy with intraoperative cholangiogram. The surgical procedure has been discussed with the patient. Potential risks, benefits, alternative treatments, and expected outcomes have been explained. All of the  patient's questions at this time have been answered. The likelihood of reaching the patient's treatment goal is good. The patient understand the proposed surgical procedure and wishes to proceed.  The surgery will need to be performed at [facility]. She may require temporary hemodialysis because she will not be able to perform peritoneal dialysis for 2 or 3 days after surgery to allow wound healing. When she is admitted, we will need to enlist the assistance of renal and [facility] hospitalists.  Janet Mitchell. Georgette Dover, MD, [facility] Beeper [phone]  03/05/2018 3:32 PM

## 2018-03-05 NOTE — H&P (View-Only) (Signed)
History of Present Illness Imogene Burn. Baily Serpe MD; 03/05/2018 3:31 PM) The patient is a 52 year old female who presents for evaluation of gall stones. PCP - Dr. Lucianne Lei Renal - Dr. Lowanda Foster - Linna Hoff  This is a 52 year old female with DM and ESRD on peritoneal dialysis who presents with recent onset of severe epigastric and RUQ abdominal pain. She presented to the ED on 11/29 with shortness of breath, as well as abdominal pain in the RUQ and right flank. She was diagnosed with cholelithiasis as well as an upper respiratory illness. Liver function tests were normal.  She had her peritoneal dialysis catheter placed several years ago in [place]: Right upper quadrant pain for 1 week.  EXAM: CT ABDOMEN AND PELVIS WITHOUT CONTRAST  TECHNIQUE: Multidetector CT imaging of the abdomen and pelvis was performed following the standard protocol without IV contrast.  COMPARISON: None  FINDINGS: Lower chest: Several small nodules are identified within the lower lobes. Index nodule in the left lower lobe measuring 5 mm, image 6/4.  Hepatobiliary: Within the limitations of unenhanced technique. No focal liver abnormality identified. Small stones noted within the dependent portion of the gallbladder. No gallbladder wall thickening identified. No biliary ductal dilatation.  Pancreas: Unremarkable. No pancreatic ductal dilatation or surrounding inflammatory changes.  Spleen: Normal in size without focal abnormality.  Adrenals/Urinary Tract: Normal appearance of the adrenal glands. Mild bilateral renal atrophy identified. Urinary bladder appears normal for degree of distention.  Stomach/Bowel: Stomach appears normal. The small bowel loops have a normal caliber. The appendix is visualized and appears within normal limits. No pathologic dilatation of the colon.  Vascular/Lymphatic: Vascular calcifications are identified involving the branch vessels off the aorta. Mild  aortic atherosclerosis. No adenopathy.  Reproductive: No adnexal mass noted.  Other: There is a peritoneal dialysis catheter identified which is coiled in the right iliac fossa. Moderate free fluid within the abdomen and pelvis noted compatible with dialysis fluid. No focal fluid collections identified. There is a umbilical hernia which contains fat only.  Musculoskeletal: Previous hardware fixation of the left hemipelvis. No acute or significant osseous abnormality.  IMPRESSION: 1. No acute findings identified within the abdomen or pelvis. 2. Gallstones noted. 3. Peritoneal dialysis catheter noted with moderate fluid in the abdomen and pelvis. 4. Several small nonspecific pulmonary nodules are identified within the visualized lung bases. These measure up to 4 mm. No follow-up needed if patient is low-risk (and has no known or suspected primary neoplasm). Non-contrast chest CT can be considered in 12 months if patient is high-risk. This recommendation follows the consensus statement: Guidelines for Management of Incidental Pulmonary Nodules Detected on CT Images: From the Fleischner Society 2017; Radiology 2017; 284:228-243.   Electronically Signed By: Kerby Moors M.D. On: 02/23/2018 21:56  CLINICAL DATA: Right upper quadrant pain  EXAM: ULTRASOUND ABDOMEN LIMITED RIGHT UPPER QUADRANT  COMPARISON: None.  FINDINGS: Gallbladder:  Small stones measuring up to 6 mm. Normal wall thickness. Negative sonographic Murphy.  Common bile duct:  Diameter: 2.6 mm  Liver:  Increased hepatic echogenicity without focal abnormality. Portal vein is patent on color Doppler imaging with normal direction of blood flow towards the liver.  IMPRESSION: Difficult study secondary to habitus. Cholelithiasis without sonographic evidence for acute cholecystitis or biliary dilatation. Increased hepatic echogenicity suggesting steatosis   Electronically Signed By: Donavan Foil  M.D. On: 02/23/2018 21:13   Allergies Emeline Gins, [place]; 03/05/2018 10:21 AM) No Known Drug Allergies [03/05/2018]: Allergies Reconciled  Medication History (  Emeline Gins, CMA; 03/05/2018 10:26 AM) HYDROcodone-Acetaminophen (10-300MG  Tablet, Oral) Active. Albuterol (90MCG/ACT Aerosol Soln, Inhalation) Active. Aspirin (81MG  Tablet DR, Oral) Active. Furosemide (80MG  Tablet, Oral) Active. amLODIPine Besy-Benazepril HCl (10-20MG  Capsule, Oral) Active. Medications Reconciled    Vitals Emeline Gins CMA; 03/05/2018 10:21 AM) 03/05/2018 10:20 AM Weight: 258.25 lb Height: 68in Body Surface Area: 2.28 m Body Mass Index: 39.27 kg/m  Temp.: 97.30F  Pulse: 79 (Regular)  BP: 148/82 (Sitting, Left Arm, Standard)      Physical Exam Rodman Key K. Pascal Stiggers MD; 03/05/2018 3:31 PM)  The physical exam findings are as follows: Note:WDWN in NAD Eyes: Pupils equal, round; sclera anicteric HENT: Oral mucosa moist; good dentition Neck: No masses palpated, no thyromegaly Lungs: CTA bilaterally; normal respiratory effort CV: Regular rate and rhythm; no murmurs; extremities well-perfused with no edema Abd: +bowel sounds, soft, tender in RUQ around to R flank, no palpable organomegaly; no palpable hernias Right lower quadrant PD catheter - no sign of infection at the insertion site Skin: Warm, dry; no sign of jaundice Psychiatric - alert and oriented x 4; calm mood and affect    Assessment & Plan Rodman Key K. Myca Perno MD; 03/05/2018 3:32 PM)  CHRONIC CHOLECYSTITIS WITH CALCULUS (K80.10)   END-STAGE RENAL DISEASE ON PERITONEAL DIALYSIS (N18.6)   DIABETES MELLITUS WITH ESRD (END-STAGE RENAL DISEASE) (E11.22)  Current Plans Schedule for Surgery - Laparoscopic cholecystectomy with intraoperative cholangiogram. The surgical procedure has been discussed with the patient. Potential risks, benefits, alternative treatments, and expected outcomes have been explained. All of the  patient's questions at this time have been answered. The likelihood of reaching the patient's treatment goal is good. The patient understand the proposed surgical procedure and wishes to proceed.  The surgery will need to be performed at [facility]. She may require temporary hemodialysis because she will not be able to perform peritoneal dialysis for 2 or 3 days after surgery to allow wound healing. When she is admitted, we will need to enlist the assistance of renal and [facility] hospitalists.  Imogene Burn. Georgette Dover, MD, [facility] Beeper [phone]  03/05/2018 3:32 PM

## 2018-03-06 DIAGNOSIS — N2581 Secondary hyperparathyroidism of renal origin: Secondary | ICD-10-CM | POA: Diagnosis not present

## 2018-03-06 DIAGNOSIS — D631 Anemia in chronic kidney disease: Secondary | ICD-10-CM | POA: Diagnosis not present

## 2018-03-06 DIAGNOSIS — Z992 Dependence on renal dialysis: Secondary | ICD-10-CM | POA: Diagnosis not present

## 2018-03-06 DIAGNOSIS — D509 Iron deficiency anemia, unspecified: Secondary | ICD-10-CM | POA: Diagnosis not present

## 2018-03-06 DIAGNOSIS — N186 End stage renal disease: Secondary | ICD-10-CM | POA: Diagnosis not present

## 2018-03-08 ENCOUNTER — Encounter (HOSPITAL_COMMUNITY): Payer: Self-pay | Admitting: *Deleted

## 2018-03-08 ENCOUNTER — Other Ambulatory Visit: Payer: Self-pay

## 2018-03-08 DIAGNOSIS — H25811 Combined forms of age-related cataract, right eye: Secondary | ICD-10-CM | POA: Diagnosis not present

## 2018-03-08 DIAGNOSIS — H2511 Age-related nuclear cataract, right eye: Secondary | ICD-10-CM | POA: Diagnosis not present

## 2018-03-08 MED ORDER — DEXTROSE 5 % IV SOLN
3.0000 g | INTRAVENOUS | Status: AC
  Administered 2018-03-09: 3 g via INTRAVENOUS
  Filled 2018-03-08: qty 3

## 2018-03-08 NOTE — Progress Notes (Addendum)
Anesthesia Chart Review: Janet Mitchell   Case:  [id number] Date/Time:  03/09/18 1245   Procedure:  LAPAROSCOPIC CHOLECYSTECTOMY WITH INTRAOPERATIVE CHOLANGIOGRAM ERAS PATHWAY (N/A )   Anesthesia type:  General   Pre-op diagnosis:  Chronic calculus cholecystitis   Location:  [facility] OR ROOM 09 / [place] OR   Surgeon:  Donnie Mesa, MD      DISCUSSION: Patient is a 52 year old female scheduled for the above procedure.  History includes former smoker, DM2, HTN, CHF, chronic renal failure (peritoneal dialysis 07/20/15), anemia, left great toe amputation, obesity.  - ED visit 02/23/18 for flu like symptoms. Symptoms improved with IV pain medication and albuterol. CXR showed no pneumonia. CT and U/S showed gallstones. Right sided abdominal pain thought likely for cholelithiasis. ED provider thought peritonitis less likely, but offered to have her wait for dialysis nurse to obtain peritoneal fluid specimen the next morning, but patient had not noted any cloudiness or discoloration to the dialysate and wished to go home. Provider did not think she required home antibiotics but reviewed strict return precautions. He also discussed findings of pulmonary nodule on CT. She was ultimately referred to general surgery.   According to 03/05/18 note by Dr. Georgette Dover, "The surgery will need to be performed at [facility]. She may require temporary hemodialysis because she will not be able to perform peritoneal dialysis for 2 or 3 days after surgery to allow wound healing. When she is admitted, we will need to enlist the assistance of renal and [facility] hospitalists."  Patient had recent cardiac testing as part of pre-renal transplant work-up. Stress echo was non-diagnostic due to sub-target HR. On 02/28/18, she presented for a nuclear stress test, but not done per stress protocol. I called and spoke with the pre-transplant RN Coordinator Alinda Dooms [phone]) to get additional details of why testing was not completed.  Apparently, patient arrived not feeling well (recovering from URI, abdominal symptoms--continuation from 02/21/18 ED visit; had not taken her medications). Her BP was elevated during the initial phases of the test and a decision was made to abort the stress test and reschedule once patient was feeling better and BP better controlled. Leigh reported that recent carotid Duplex showed 21-39% BICA stenosis.  Reviewed incomplete cardiac studies with anesthesiologist Bryson Ha, MD. Testing was ordered routinely for pre-transplant work-up and not due to complaints of CV symptoms. LVEF WNL. If she is asymptomatic from a CV standpoint then would not necessarily require surgery to be postponed until after testing is rescheduled. PAT RN still attempting to reach patient to inquire about any additional symptoms and complete preoperative history and teaching.   Of note, her blood pressure has been significantly elevated at times. She is on medication, and BP on 03/05/18 was reasonable at 148/82 (171/71 on 02/23/18 in ED). She will get vitals on arrival. She is also chronically anemic. Dr. Vonna Kotyk H&P indicates that he reviewed her recent ED labs. She will at least need an ISTAT on arrival due to ESRD. I will also order a T&S since last HGB 8.5. Further evaluation on the day of surgery by her anesthesia team.   VS: Vitals in ED 02/23/18: BP 171/71, HR 76, T 98.2 T, RR 17, O2 sat 95%. BMI 38.01. BP 148/82 on 03/05/18 at [place].   PROVIDERS: Lucianne Lei, MD is PCP Fran Lowes, MD is nephrologist ([place]). BP 180/100 at 08/25/17 visit. She is being evaluated by [facility] for consideration of renal transplant. Pre-transplant RN Coordinator Alinda Dooms, RN (  [phone]).   LABS: A1c 12/19/17 was 6.8 ([place]). Labs from 02/23/18 showed: Lab Results  Component Value Date   WBC 8.3 02/23/2018   HGB 8.5 (L) 02/23/2018   HCT 29.0 (L) 02/23/2018   PLT 189 02/23/2018   GLUCOSE 108  (H) 02/23/2018   ALT 14 02/23/2018   AST 16 02/23/2018   NA 135 02/23/2018   K 4.3 02/23/2018   CL 95 (L) 02/23/2018   CREATININE 14.00 (H) 02/23/2018   BUN 64 (H) 02/23/2018   CO2 24 02/23/2018   H/H trends: 8.5/29.0  02/23/18 10.6/33.6  12/19/17 ([place]) 7.9/24.5  11/09/17 8.5-26.2  11/08/17   IMAGES: CXR 02/23/18: IMPRESSION: No active cardiopulmonary disease.  CT abd/pelvis 02/23/18: IMPRESSION: 1. No acute findings identified within the abdomen or pelvis. 2. Gallstones noted. 3. Peritoneal dialysis catheter noted with moderate fluid in the abdomen and pelvis. 4. Several small nonspecific pulmonary nodules are identified within the visualized lung bases. These measure up to 4 mm. No follow-up needed if patient is low-risk (and has no known or suspected primary neoplasm). Non-contrast chest CT can be considered in 12 months if patient is high-risk. This recommendation follows the consensus statement: Guidelines for Management of Incidental Pulmonary Nodules Detected on CT Images: From the Fleischner Society 2017; Radiology 2017; 284:228-243.  Abd U/S 02/23/18: IMPRESSION: Difficult study secondary to habitus. Cholelithiasis without sonographic evidence for acute cholecystitis or biliary dilatation. Increased hepatic echogenicity suggesting steatosis   EKG: 02/23/18: SR, LAD.    CV: Nuclear stress test 02/28/18 ([place]; pre-transplant evaluation): Result Impression: - Mid and apical anterolateral and basal and mid inferior regions demonstrate mildly decreased uptake, which may reflect attenuation artifact versus fixed defect. A stress protocol was not performed per the ordering cardiologist's request, limiting assessment for inducible ischemia. - Ejection fraction and wall motion abnormalities: Not able to be reliably reported due to issues with gating during the study.  Stress echo 02/08/18 ([place], pre-transplant  evaluation): SUMMARY The patient had no chest pain during stress The patient achieved 64 % of maximum predicted heart rate. Nondiagnostic stress ECG due to subtarget heart rate. Nondiagnostic dobutamine echocardiography due to subtarget heart rate (Notes indicate that "Test stopped due to elevated blood pressure.")  Echo 02/08/18 ([place], pre-transplant evaluation): SUMMARY The left ventricular size is normal. Mild left ventricular hypertrophy LV ejection fraction = 60-65%. Left ventricular systolic function is normal. The right ventricle is normal in size and function. The left atrium is mildly dilated. The right atrium is mildly dilated. There is mild mitral regurgitation. There is mild tricuspid regurgitation. No significant stenosis seen There was insufficient TR detected to calculate RV systolic pressure. Estimated right atrial pressure is 5 mmHg.Marland Kitchen There is no pericardial effusion. There is no comparison study available.  Carotid Duplex 02/08/18 [facility]): By verbal report, "21-39% bilateral internal carotid stenoses." Report requested.   Past Medical History:  Diagnosis Date  . Anemia   . CHF (congestive heart failure) ([place])   . Diabetes mellitus without complication ([place])   . Headache   . Hypertension   . Renal disorder     Past Surgical History:  Procedure Laterality Date  . AMPUTATION TOE Left    Great left toe 2013.  Marland Kitchen CESAREAN SECTION     x2. 1994 and 1998  . FLEXIBLE SIGMOIDOSCOPY N/A 11/24/2017   Procedure: FLEXIBLE SIGMOIDOSCOPY;  Surgeon: Daneil Dolin, MD;  Location: [facility] SUITE;  Service: Endoscopy;  Laterality: N/A;  . TOTAL  HIP ARTHROPLASTY Left 1997    MEDICATIONS: No current facility-administered medications for this encounter.    Marland Kitchen amLODipine (NORVASC) 10 MG tablet  . aspirin EC 81 MG tablet  . calcitRIOL (ROCALTROL) 0.5 MCG capsule  . doxazosin (CARDURA) 4 MG tablet  . HYDROcodone-acetaminophen (NORCO) 5-325 MG tablet  .  Insulin Glargine (TOUJEO MAX SOLOSTAR) 300 UNIT/ML SOPN  . labetalol (NORMODYNE) 200 MG tablet  . metoprolol succinate (TOPROL-XL) 100 MG 24 hr tablet  . ondansetron (ZOFRAN ODT) 4 MG disintegrating tablet  . polyethylene glycol-electrolytes (TRILYTE) 420 g solution  . torsemide (DEMADEX) 100 MG tablet    George Hugh [facility] Short Stay Center/Anesthesiology Phone [phone] 03/08/2018 3:13 PM

## 2018-03-08 NOTE — Progress Notes (Signed)
Pt denies SOB and being under the care of a cardiologist. Pt stated " I have pain in my upper chest from coughing, it is not my heart or my gallbladder. " Pt stated that she had cataract surgery today. Pt denies having a cardiac cath. Pt made aware to stop taking vitamins, fish oil and herbal medications. Do not take any NSAIDs ie: Ibuprofen, Advil, Naproxen (Aleve), Motrin, BC and Goody Powder or any medication containing Aspirin.  Pt made aware to take only 7 units of Toujeo insulin tonight. Pt made aware to check BG every 2 hours prior to arrival to hospital on DOS. Pt made aware to treat a BG < 70 with 4 glucose tabs or glucose gel or 4 ounces of apple or cranberry juice, wait 15 minutes after intervention to recheck BG, if BG remains < 70, call Short Stay unit to speak with a nurse. Pt verbalized understanding of all pre-op instructions. PA, Anesthesiology, asked to review pt history; see note.

## 2018-03-08 NOTE — Anesthesia Preprocedure Evaluation (Addendum)
Anesthesia Evaluation  Patient identified by MRN, date of birth, ID band Patient awake    Reviewed: Allergy & Precautions, NPO status , Patient's Chart, lab work & pertinent test results  History of Anesthesia Complications (+) POST - OP SPINAL HEADACHE and history of anesthetic complications  Airway Mallampati: II  TM Distance: >3 FB Neck ROM: Full    Dental  (+) Dental Advisory Given, Teeth Intact   Pulmonary former smoker,    breath sounds clear to auscultation       Cardiovascular hypertension, Pt. on medications and Pt. on home beta blockers  Rhythm:Regular Rate:Normal   '19 Dobutamine Stress (Care Everywhere) - The patient had no chest pain during stress. The patient achieved 64 % of maximum predicted heart rate.Nondiagnostic stress ECG due to subtarget heart rate.Nondiagnostic dobutamine echocardiography due to subtarget heart rate -  FINDINGS:  ECG REST  The baseline ECG displays normal sinus rhythm.  ECG STRESS  No diagnostic ST segment changes were seen. Nondiagnostic stress ECG due to subtarget heart rate. The stress protocol, dobutamine perfusion time, ECG   changes, blood pressure and heart rate responses are shown in the Cardiology Scan portion of this report in EPIC.  REST ECHO  Normal left ventricular function at rest. There were no segmental wall motion abnormalities at rest. The estimated LV ejection fraction is 55-60% .   LOW DOSE  There was normal augmentation of all left ventricular wall segments with dobutamine. There were no segmental wall motion abnormalities with low dose of dobutamine.  PEAK DOSE  Normal left ventricular function and global wall motion with stress. Global LV function is preserved with stress. There were no segmental wall motion abnormalities with dobutamine. There was normal augmentation of all left   ventricular wall segments with dobutamine. The estimated LV ejection  fraction is 60-65% with stress. Nondiagnostic dobutamine echocardiography due to   subtarget heart rate.    Neuro/Psych  Headaches, negative psych ROS   GI/Hepatic negative GI ROS, Neg liver ROS,   Endo/Other  diabetes, Insulin DependentMorbid obesity  Renal/GU ESRF and DialysisRenal disease     Musculoskeletal negative musculoskeletal ROS (+)   Abdominal (+) + obese,   Peds  Hematology  (+) anemia ,   Anesthesia Other Findings   Reproductive/Obstetrics                           Anesthesia Physical Anesthesia Plan  ASA: III  Anesthesia Plan: General   Post-op Pain Management:    Induction: Intravenous  PONV Risk Score and Plan: 4 or greater and Treatment may vary due to age or medical condition, Ondansetron, Propofol infusion, Midazolam and Dexamethasone  Airway Management Planned: Oral ETT  Additional Equipment: None  Intra-op Plan:   Post-operative Plan: Extubation in OR  Informed Consent: I have reviewed the patients History and Physical, chart, labs and discussed the procedure including the risks, benefits and alternatives for the proposed anesthesia with the patient or authorized representative who has indicated his/her understanding and acceptance.   Dental advisory given  Plan Discussed with: CRNA and Anesthesiologist  Anesthesia Plan Comments: ( )       Anesthesia Quick Evaluation

## 2018-03-09 ENCOUNTER — Encounter (HOSPITAL_COMMUNITY): Payer: Self-pay | Admitting: Anesthesiology

## 2018-03-09 ENCOUNTER — Inpatient Hospital Stay (HOSPITAL_COMMUNITY): Payer: Medicare Other | Admitting: Vascular Surgery

## 2018-03-09 ENCOUNTER — Encounter (HOSPITAL_COMMUNITY)
Admission: RE | Disposition: A | Discharge status: Home / Self Care | Payer: Self-pay | Source: Home / Self Care | Attending: Surgery

## 2018-03-09 ENCOUNTER — Other Ambulatory Visit: Payer: Self-pay

## 2018-03-09 ENCOUNTER — Inpatient Hospital Stay (HOSPITAL_COMMUNITY): Payer: Medicare Other

## 2018-03-09 ENCOUNTER — Inpatient Hospital Stay (HOSPITAL_COMMUNITY)
Admission: RE | Admit: 2018-03-09 | Discharge: 2018-03-11 | DRG: 417 | Disposition: A | Discharge status: Home / Self Care | Payer: Medicare Other | Attending: Surgery | Admitting: Surgery

## 2018-03-09 DIAGNOSIS — E1129 Type 2 diabetes mellitus with other diabetic kidney complication: Secondary | ICD-10-CM

## 2018-03-09 DIAGNOSIS — K8010 Calculus of gallbladder with chronic cholecystitis without obstruction: Secondary | ICD-10-CM | POA: Diagnosis present

## 2018-03-09 DIAGNOSIS — Z89412 Acquired absence of left great toe: Secondary | ICD-10-CM | POA: Diagnosis not present

## 2018-03-09 DIAGNOSIS — I10 Essential (primary) hypertension: Secondary | ICD-10-CM | POA: Diagnosis not present

## 2018-03-09 DIAGNOSIS — D631 Anemia in chronic kidney disease: Secondary | ICD-10-CM | POA: Diagnosis present

## 2018-03-09 DIAGNOSIS — Z79899 Other long term (current) drug therapy: Secondary | ICD-10-CM | POA: Diagnosis not present

## 2018-03-09 DIAGNOSIS — Z96642 Presence of left artificial hip joint: Secondary | ICD-10-CM | POA: Diagnosis present

## 2018-03-09 DIAGNOSIS — Z419 Encounter for procedure for purposes other than remedying health state, unspecified: Secondary | ICD-10-CM

## 2018-03-09 DIAGNOSIS — I120 Hypertensive chronic kidney disease with stage 5 chronic kidney disease or end stage renal disease: Secondary | ICD-10-CM | POA: Diagnosis not present

## 2018-03-09 DIAGNOSIS — E1169 Type 2 diabetes mellitus with other specified complication: Secondary | ICD-10-CM

## 2018-03-09 DIAGNOSIS — E119 Type 2 diabetes mellitus without complications: Secondary | ICD-10-CM

## 2018-03-09 DIAGNOSIS — E8889 Other specified metabolic disorders: Secondary | ICD-10-CM | POA: Diagnosis present

## 2018-03-09 DIAGNOSIS — Z794 Long term (current) use of insulin: Secondary | ICD-10-CM

## 2018-03-09 DIAGNOSIS — N186 End stage renal disease: Secondary | ICD-10-CM | POA: Diagnosis present

## 2018-03-09 DIAGNOSIS — Z6838 Body mass index (BMI) 38.0-38.9, adult: Secondary | ICD-10-CM

## 2018-03-09 DIAGNOSIS — I509 Heart failure, unspecified: Secondary | ICD-10-CM | POA: Diagnosis present

## 2018-03-09 DIAGNOSIS — E1122 Type 2 diabetes mellitus with diabetic chronic kidney disease: Secondary | ICD-10-CM | POA: Diagnosis present

## 2018-03-09 DIAGNOSIS — I132 Hypertensive heart and chronic kidney disease with heart failure and with stage 5 chronic kidney disease, or end stage renal disease: Secondary | ICD-10-CM | POA: Diagnosis present

## 2018-03-09 DIAGNOSIS — Z7982 Long term (current) use of aspirin: Secondary | ICD-10-CM | POA: Diagnosis not present

## 2018-03-09 DIAGNOSIS — K429 Umbilical hernia without obstruction or gangrene: Secondary | ICD-10-CM | POA: Diagnosis present

## 2018-03-09 DIAGNOSIS — Z87891 Personal history of nicotine dependence: Secondary | ICD-10-CM | POA: Diagnosis not present

## 2018-03-09 DIAGNOSIS — Z992 Dependence on renal dialysis: Secondary | ICD-10-CM | POA: Diagnosis not present

## 2018-03-09 DIAGNOSIS — R1011 Right upper quadrant pain: Secondary | ICD-10-CM | POA: Diagnosis not present

## 2018-03-09 DIAGNOSIS — E669 Obesity, unspecified: Secondary | ICD-10-CM | POA: Diagnosis present

## 2018-03-09 DIAGNOSIS — K8020 Calculus of gallbladder without cholecystitis without obstruction: Secondary | ICD-10-CM | POA: Diagnosis not present

## 2018-03-09 HISTORY — DX: Type 2 diabetes mellitus without complications: E11.9

## 2018-03-09 HISTORY — PX: CHOLECYSTECTOMY: SHX55

## 2018-03-09 HISTORY — DX: Calculus of gallbladder with chronic cholecystitis without obstruction: K80.10

## 2018-03-09 HISTORY — DX: Dependence on renal dialysis: Z99.2

## 2018-03-09 HISTORY — DX: Other reaction to spinal and lumbar puncture: G97.1

## 2018-03-09 HISTORY — DX: Personal history of other medical treatment: Z92.89

## 2018-03-09 HISTORY — DX: End stage renal disease: N18.6

## 2018-03-09 HISTORY — PX: LAPAROSCOPIC CHOLECYSTECTOMY: SUR755

## 2018-03-09 LAB — GLUCOSE, CAPILLARY
GLUCOSE-CAPILLARY: 134 mg/dL — AB (ref 70–99)
GLUCOSE-CAPILLARY: 133 mg/dL — AB (ref 70–99)
Glucose-Capillary: 109 mg/dL — ABNORMAL HIGH (ref 70–99)
Glucose-Capillary: 225 mg/dL — ABNORMAL HIGH (ref 70–99)

## 2018-03-09 LAB — POCT I-STAT 4, (NA,K, GLUC, HGB,HCT)
Glucose, Bld: 106 mg/dL — ABNORMAL HIGH (ref 70–99)
HCT: 22 % — ABNORMAL LOW (ref 36.0–46.0)
HEMOGLOBIN: 7.5 g/dL — AB (ref 12.0–15.0)
Potassium: 3.4 mmol/L — ABNORMAL LOW (ref 3.5–5.1)
Sodium: 137 mmol/L (ref 135–145)

## 2018-03-09 LAB — HCG, SERUM, QUALITATIVE: Preg, Serum: NEGATIVE

## 2018-03-09 IMAGING — RF DG CHOLANGIOGRAM OPERATIVE
1 series · 4 of 4 positions shown · non-contrast
Comparison: None.

CLINICAL DATA: Cholelithiasis

EXAM:
INTRAOPERATIVE CHOLANGIOGRAM
TECHNIQUE: Cholangiographic images from the C-arm fluoroscopic device were
submitted for interpretation post-operatively. Please see the
procedural report for the amount of contrast and the fluoroscopy
time utilized.

[Series 1: run · 4 of 72 frames shown]
[frame 11/72]
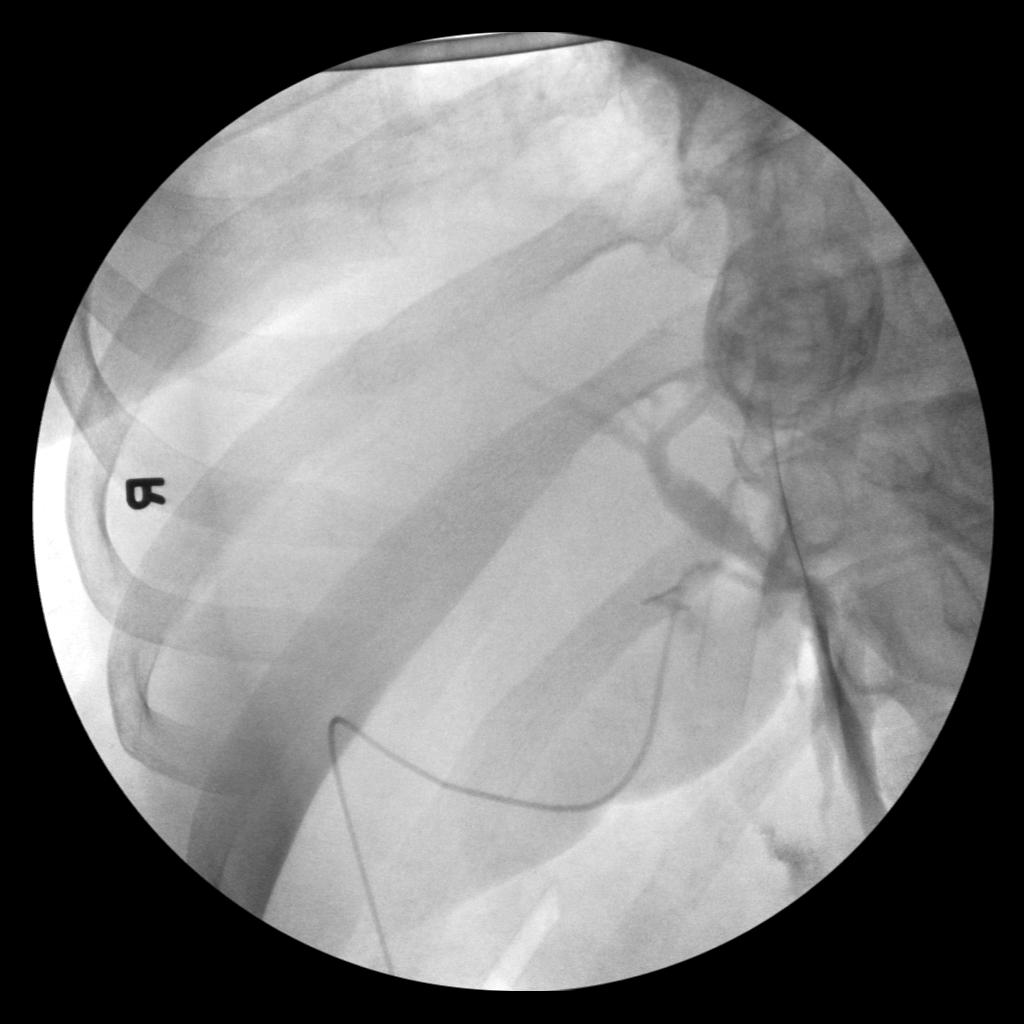
[frame 37/72]
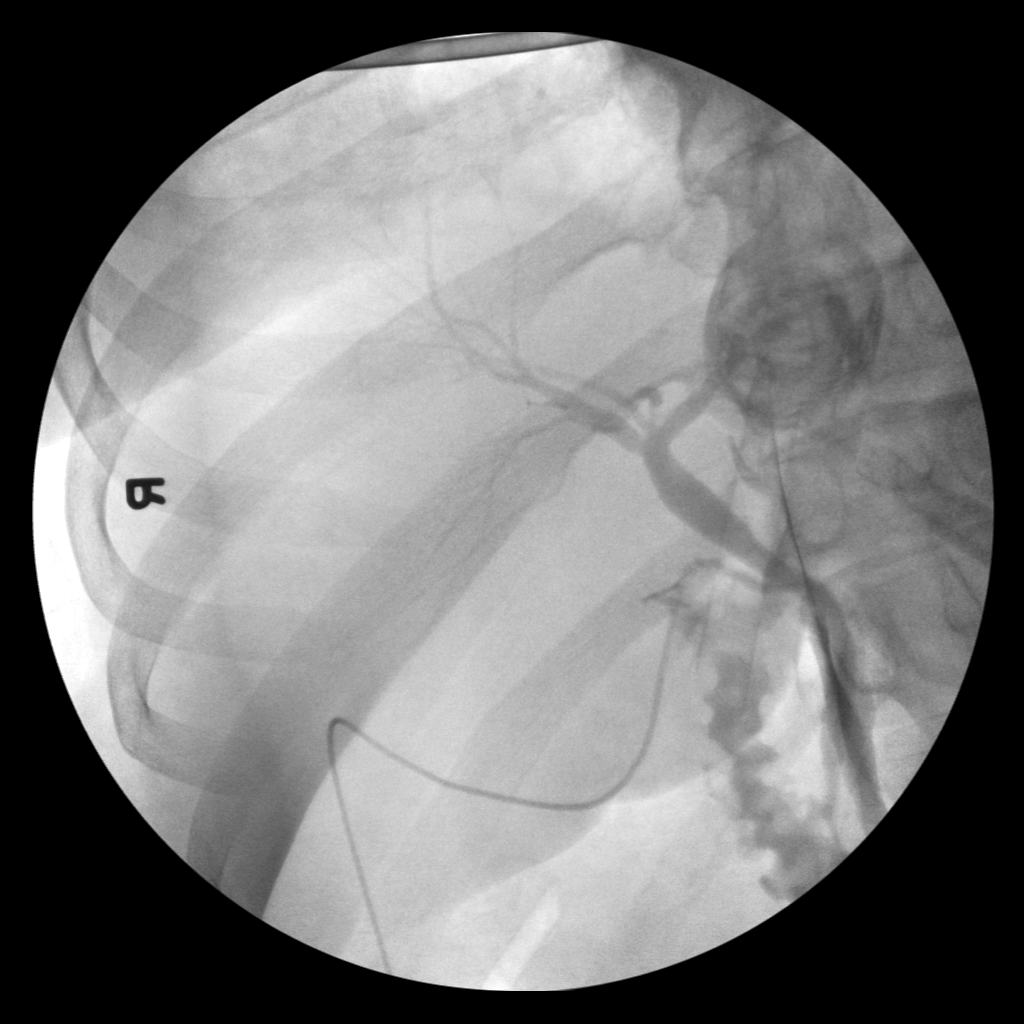
[frame 62/72]
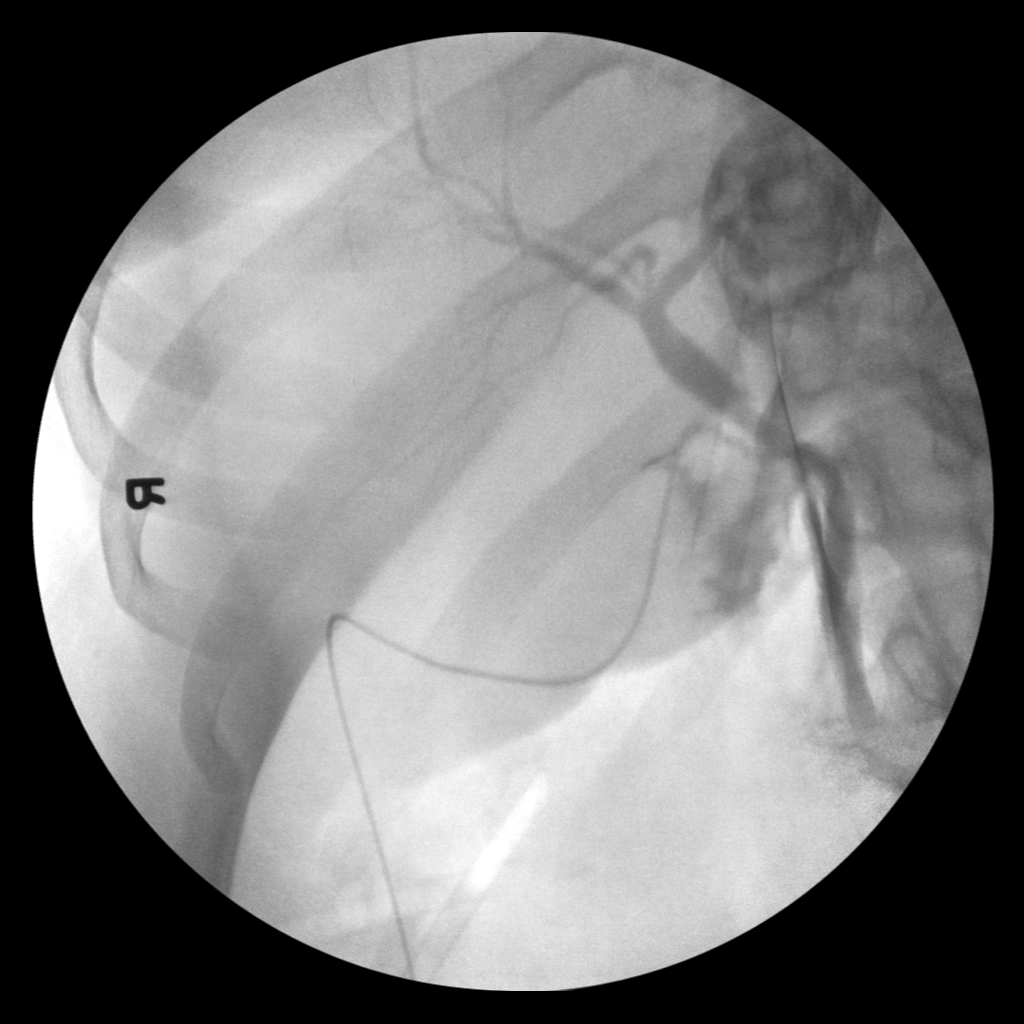
[frame 72/72]
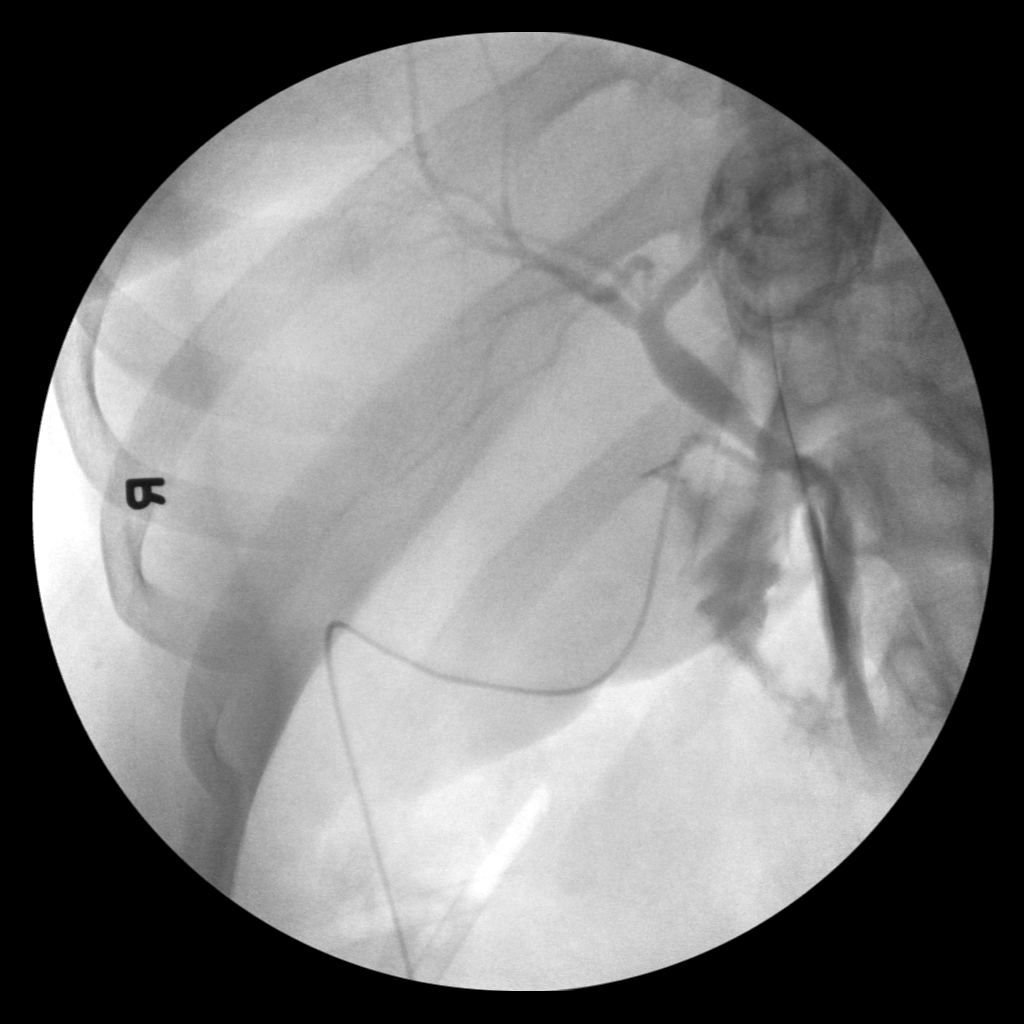

[4 of 4 positions shown; findings below may reference images not displayed]

FINDINGS: Contrast fills the biliary tree and duodenum without filling defects
in the common bile duct.
IMPRESSION: Patent biliary tree.

## 2018-03-09 SURGERY — LAPAROSCOPIC CHOLECYSTECTOMY WITH INTRAOPERATIVE CHOLANGIOGRAM
Anesthesia: General

## 2018-03-09 MED ORDER — CALCITRIOL 0.5 MCG PO CAPS
1.0000 ug | ORAL_CAPSULE | ORAL | Status: DC
  Administered 2018-03-10 – 2018-03-11 (×2): 1 ug via ORAL
  Filled 2018-03-09 (×2): qty 2

## 2018-03-09 MED ORDER — CEFAZOLIN SODIUM-DEXTROSE 2-4 GM/100ML-% IV SOLN
2.0000 g | Freq: Three times a day (TID) | INTRAVENOUS | Status: AC
  Administered 2018-03-09: 2 g via INTRAVENOUS
  Filled 2018-03-09: qty 100

## 2018-03-09 MED ORDER — ROCURONIUM BROMIDE 50 MG/5ML IV SOSY
PREFILLED_SYRINGE | INTRAVENOUS | Status: AC
  Filled 2018-03-09: qty 5

## 2018-03-09 MED ORDER — CALCITRIOL 0.5 MCG PO CAPS: 0.5000 ug | ORAL_CAPSULE | ORAL | Status: DC

## 2018-03-09 MED ORDER — INSULIN GLARGINE 100 UNIT/ML ~~LOC~~ SOLN
14.0000 [IU] | Freq: Every day | SUBCUTANEOUS | Status: DC
  Administered 2018-03-10: 14 [IU] via SUBCUTANEOUS
  Filled 2018-03-09: qty 0.14

## 2018-03-09 MED ORDER — INSULIN ASPART 100 UNIT/ML ~~LOC~~ SOLN
0.0000 [IU] | Freq: Three times a day (TID) | SUBCUTANEOUS | Status: DC
  Administered 2018-03-10 (×2): 1 [IU] via SUBCUTANEOUS

## 2018-03-09 MED ORDER — PHENYLEPHRINE 40 MCG/ML (10ML) SYRINGE FOR IV PUSH (FOR BLOOD PRESSURE SUPPORT)
PREFILLED_SYRINGE | INTRAVENOUS | Status: DC | PRN
  Administered 2018-03-09: 80 ug via INTRAVENOUS

## 2018-03-09 MED ORDER — MIDAZOLAM HCL 2 MG/2ML IJ SOLN
INTRAMUSCULAR | Status: AC
  Filled 2018-03-09: qty 2

## 2018-03-09 MED ORDER — ONDANSETRON HCL 4 MG/2ML IJ SOLN
INTRAMUSCULAR | Status: DC | PRN
  Administered 2018-03-09: 4 mg via INTRAVENOUS

## 2018-03-09 MED ORDER — BUPIVACAINE-EPINEPHRINE (PF) 0.25% -1:200000 IJ SOLN
INTRAMUSCULAR | Status: AC
  Filled 2018-03-09: qty 30

## 2018-03-09 MED ORDER — PHENYLEPHRINE 40 MCG/ML (10ML) SYRINGE FOR IV PUSH (FOR BLOOD PRESSURE SUPPORT)
PREFILLED_SYRINGE | INTRAVENOUS | Status: AC
  Filled 2018-03-09: qty 10

## 2018-03-09 MED ORDER — NEOSTIGMINE METHYLSULFATE 3 MG/3ML IV SOSY
PREFILLED_SYRINGE | INTRAVENOUS | Status: AC
  Filled 2018-03-09: qty 6

## 2018-03-09 MED ORDER — DOXAZOSIN MESYLATE 2 MG PO TABS
4.0000 mg | ORAL_TABLET | Freq: Every day | ORAL | Status: DC
  Administered 2018-03-09 – 2018-03-10 (×2): 4 mg via ORAL
  Filled 2018-03-09 (×2): qty 2

## 2018-03-09 MED ORDER — ACETAMINOPHEN 650 MG RE SUPP: 650.0000 mg | Freq: Four times a day (QID) | RECTAL | Status: DC | PRN

## 2018-03-09 MED ORDER — ONDANSETRON HCL 4 MG/2ML IJ SOLN
INTRAMUSCULAR | Status: AC
  Filled 2018-03-09: qty 2

## 2018-03-09 MED ORDER — FENTANYL CITRATE (PF) 100 MCG/2ML IJ SOLN
25.0000 ug | INTRAMUSCULAR | Status: DC | PRN
  Administered 2018-03-09: 25 ug via INTRAVENOUS

## 2018-03-09 MED ORDER — BUPIVACAINE-EPINEPHRINE 0.25% -1:200000 IJ SOLN
INTRAMUSCULAR | Status: DC | PRN
  Administered 2018-03-09: 10 mL

## 2018-03-09 MED ORDER — ONDANSETRON HCL 4 MG/2ML IJ SOLN: 4.0000 mg | Freq: Once | INTRAMUSCULAR | Status: DC | PRN

## 2018-03-09 MED ORDER — MORPHINE SULFATE (PF) 2 MG/ML IV SOLN: 2.0000 mg | INTRAVENOUS | Status: DC | PRN

## 2018-03-09 MED ORDER — SODIUM CHLORIDE 0.9 % IR SOLN
Status: DC | PRN
  Administered 2018-03-09: 1

## 2018-03-09 MED ORDER — CHLORHEXIDINE GLUCONATE CLOTH 2 % EX PADS: 6.0000 | MEDICATED_PAD | Freq: Once | CUTANEOUS | Status: DC

## 2018-03-09 MED ORDER — SODIUM CHLORIDE 0.9 % IV SOLN
INTRAVENOUS | Status: DC
  Administered 2018-03-09: 12:00:00 via INTRAVENOUS

## 2018-03-09 MED ORDER — TORSEMIDE 20 MG PO TABS
100.0000 mg | ORAL_TABLET | Freq: Every day | ORAL | Status: DC
  Administered 2018-03-09 – 2018-03-10 (×2): 100 mg via ORAL
  Filled 2018-03-09 (×2): qty 5

## 2018-03-09 MED ORDER — LIDOCAINE 2% (20 MG/ML) 5 ML SYRINGE
INTRAMUSCULAR | Status: AC
  Filled 2018-03-09: qty 5

## 2018-03-09 MED ORDER — DIPHENHYDRAMINE HCL 12.5 MG/5ML PO ELIX: 12.5000 mg | ORAL_SOLUTION | Freq: Four times a day (QID) | ORAL | Status: DC | PRN

## 2018-03-09 MED ORDER — LABETALOL HCL 200 MG PO TABS
200.0000 mg | ORAL_TABLET | Freq: Two times a day (BID) | ORAL | Status: DC
  Administered 2018-03-09 – 2018-03-11 (×4): 200 mg via ORAL
  Filled 2018-03-09 (×4): qty 1

## 2018-03-09 MED ORDER — GLYCOPYRROLATE PF 0.2 MG/ML IJ SOSY
PREFILLED_SYRINGE | INTRAMUSCULAR | Status: DC | PRN
  Administered 2018-03-09: 0.6 mg via INTRAVENOUS

## 2018-03-09 MED ORDER — METOPROLOL TARTRATE 5 MG/5ML IV SOLN: 5.0000 mg | Freq: Four times a day (QID) | INTRAVENOUS | Status: DC | PRN

## 2018-03-09 MED ORDER — AMLODIPINE BESYLATE 10 MG PO TABS
10.0000 mg | ORAL_TABLET | Freq: Every day | ORAL | Status: DC
  Administered 2018-03-10 – 2018-03-11 (×2): 10 mg via ORAL
  Filled 2018-03-09 (×2): qty 1

## 2018-03-09 MED ORDER — FENTANYL CITRATE (PF) 100 MCG/2ML IJ SOLN
INTRAMUSCULAR | Status: AC
  Filled 2018-03-09: qty 2

## 2018-03-09 MED ORDER — ONDANSETRON HCL 4 MG/2ML IJ SOLN: 4.0000 mg | Freq: Four times a day (QID) | INTRAMUSCULAR | Status: DC | PRN

## 2018-03-09 MED ORDER — INSULIN ASPART 100 UNIT/ML ~~LOC~~ SOLN: 0.0000 [IU] | Freq: Three times a day (TID) | SUBCUTANEOUS | Status: DC

## 2018-03-09 MED ORDER — INSULIN ASPART 100 UNIT/ML ~~LOC~~ SOLN: 6.0000 [IU] | Freq: Three times a day (TID) | SUBCUTANEOUS | Status: DC

## 2018-03-09 MED ORDER — PROPOFOL 10 MG/ML IV BOLUS
INTRAVENOUS | Status: DC | PRN
  Administered 2018-03-09: 130 mg via INTRAVENOUS
  Administered 2018-03-09: 20 mg via INTRAVENOUS

## 2018-03-09 MED ORDER — ACETAMINOPHEN 325 MG PO TABS: 650.0000 mg | ORAL_TABLET | Freq: Four times a day (QID) | ORAL | Status: DC | PRN

## 2018-03-09 MED ORDER — DIPHENHYDRAMINE HCL 50 MG/ML IJ SOLN: 12.5000 mg | Freq: Four times a day (QID) | INTRAMUSCULAR | Status: DC | PRN

## 2018-03-09 MED ORDER — FENTANYL CITRATE (PF) 100 MCG/2ML IJ SOLN
INTRAMUSCULAR | Status: DC | PRN
  Administered 2018-03-09 (×2): 50 ug via INTRAVENOUS

## 2018-03-09 MED ORDER — DEXAMETHASONE SODIUM PHOSPHATE 10 MG/ML IJ SOLN
INTRAMUSCULAR | Status: DC | PRN
  Administered 2018-03-09: 10 mg via INTRAVENOUS

## 2018-03-09 MED ORDER — PROPOFOL 10 MG/ML IV BOLUS
INTRAVENOUS | Status: AC
  Filled 2018-03-09: qty 20

## 2018-03-09 MED ORDER — ROCURONIUM BROMIDE 10 MG/ML (PF) SYRINGE
PREFILLED_SYRINGE | INTRAVENOUS | Status: DC | PRN
  Administered 2018-03-09: 40 mg via INTRAVENOUS
  Administered 2018-03-09: 5 mg via INTRAVENOUS
  Administered 2018-03-09: 10 mg via INTRAVENOUS

## 2018-03-09 MED ORDER — LIDOCAINE 2% (20 MG/ML) 5 ML SYRINGE
INTRAMUSCULAR | Status: DC | PRN
  Administered 2018-03-09: 60 mg via INTRAVENOUS

## 2018-03-09 MED ORDER — FENTANYL CITRATE (PF) 250 MCG/5ML IJ SOLN
INTRAMUSCULAR | Status: AC
  Filled 2018-03-09: qty 5

## 2018-03-09 MED ORDER — NEOSTIGMINE METHYLSULFATE 3 MG/3ML IV SOSY
PREFILLED_SYRINGE | INTRAVENOUS | Status: DC | PRN
  Administered 2018-03-09: 5 mg via INTRAVENOUS

## 2018-03-09 MED ORDER — INSULIN ASPART 100 UNIT/ML ~~LOC~~ SOLN: 0.0000 [IU] | Freq: Every day | SUBCUTANEOUS | Status: DC

## 2018-03-09 MED ORDER — DEXAMETHASONE SODIUM PHOSPHATE 10 MG/ML IJ SOLN
INTRAMUSCULAR | Status: AC
  Filled 2018-03-09: qty 1

## 2018-03-09 MED ORDER — INSULIN GLARGINE 100 UNIT/ML ~~LOC~~ SOLN
14.0000 [IU] | Freq: Every day | SUBCUTANEOUS | Status: DC
  Filled 2018-03-09: qty 0.14

## 2018-03-09 MED ORDER — ONDANSETRON 4 MG PO TBDP: 4.0000 mg | ORAL_TABLET | Freq: Four times a day (QID) | ORAL | Status: DC | PRN

## 2018-03-09 MED ORDER — OXYCODONE HCL 5 MG PO TABS: 5.0000 mg | ORAL_TABLET | Freq: Four times a day (QID) | ORAL | 0 refills | Status: DC | PRN

## 2018-03-09 MED ORDER — GLYCOPYRROLATE PF 0.2 MG/ML IJ SOSY
PREFILLED_SYRINGE | INTRAMUSCULAR | Status: AC
  Filled 2018-03-09: qty 3

## 2018-03-09 MED ORDER — OXYCODONE HCL 5 MG PO TABS
5.0000 mg | ORAL_TABLET | ORAL | Status: DC | PRN
  Administered 2018-03-09: 5 mg via ORAL
  Administered 2018-03-10: 10 mg via ORAL
  Filled 2018-03-09: qty 2
  Filled 2018-03-09: qty 1

## 2018-03-09 MED ORDER — MIDAZOLAM HCL 5 MG/5ML IJ SOLN
INTRAMUSCULAR | Status: DC | PRN
  Administered 2018-03-09: 2 mg via INTRAVENOUS

## 2018-03-09 MED ORDER — SODIUM CHLORIDE 0.9 % IV SOLN
INTRAVENOUS | Status: DC | PRN
  Administered 2018-03-09: 50 mL

## 2018-03-09 MED ORDER — IOPAMIDOL (ISOVUE-300) INJECTION 61%
INTRAVENOUS | Status: AC
  Filled 2018-03-09: qty 50

## 2018-03-09 MED ORDER — ASPIRIN EC 81 MG PO TBEC
81.0000 mg | DELAYED_RELEASE_TABLET | Freq: Every day | ORAL | Status: DC
  Administered 2018-03-09 – 2018-03-10 (×2): 81 mg via ORAL
  Filled 2018-03-09 (×2): qty 1

## 2018-03-09 MED ORDER — 0.9 % SODIUM CHLORIDE (POUR BTL) OPTIME
TOPICAL | Status: DC | PRN
  Administered 2018-03-09: 1000 mL

## 2018-03-09 SURGICAL SUPPLY — 51 items
APL SKNCLS STERI-STRIP NONHPOA (GAUZE/BANDAGES/DRESSINGS) ×4
APPLIER CLIP ROT 10 11.4 M/L (STAPLE) ×2
APR CLP MED LRG 11.4X10 (STAPLE) ×1
BAG SPEC RTRVL 10 TROC 200 (ENDOMECHANICALS)
BAG SPEC RTRVL LRG 6X4 10 (ENDOMECHANICALS) ×1
BENZOIN TINCTURE PRP APPL 2/3 (GAUZE/BANDAGES/DRESSINGS) ×5 IMPLANT
BLADE CLIPPER SURG (BLADE) IMPLANT
CANISTER SUCT 3000ML PPV (MISCELLANEOUS) ×2 IMPLANT
CHLORAPREP W/TINT 26ML (MISCELLANEOUS) ×2 IMPLANT
CLIP APPLIE ROT 10 11.4 M/L (STAPLE) ×1 IMPLANT
COVER MAYO STAND STRL (DRAPES) ×2 IMPLANT
COVER SURGICAL LIGHT HANDLE (MISCELLANEOUS) ×2 IMPLANT
COVER WAND RF STERILE (DRAPES) ×2 IMPLANT
DRAPE C-ARM 42X72 X-RAY (DRAPES) ×2 IMPLANT
DRSG TEGADERM 2-3/8X2-3/4 SM (GAUZE/BANDAGES/DRESSINGS) ×6 IMPLANT
DRSG TEGADERM 4X4.75 (GAUZE/BANDAGES/DRESSINGS) ×2 IMPLANT
ELECT REM PT RETURN 9FT ADLT (ELECTROSURGICAL) ×2
ELECTRODE REM PT RTRN 9FT ADLT (ELECTROSURGICAL) ×1 IMPLANT
FILTER SMOKE EVAC LAPAROSHD (FILTER) ×2 IMPLANT
GAUZE SPONGE 2X2 8PLY STRL LF (GAUZE/BANDAGES/DRESSINGS) ×1 IMPLANT
GAUZE SPONGE 4X4 16PLY XRAY LF (GAUZE/BANDAGES/DRESSINGS) ×1 IMPLANT
GLOVE BIO SURGEON STRL SZ7 (GLOVE) ×2 IMPLANT
GLOVE BIOGEL PI IND STRL 7.5 (GLOVE) ×1 IMPLANT
GLOVE BIOGEL PI INDICATOR 7.5 (GLOVE) ×1
GOWN STRL REUS W/ TWL LRG LVL3 (GOWN DISPOSABLE) ×3 IMPLANT
GOWN STRL REUS W/TWL LRG LVL3 (GOWN DISPOSABLE) ×6
KIT BASIN OR (CUSTOM PROCEDURE TRAY) ×2 IMPLANT
KIT TURNOVER KIT B (KITS) ×2 IMPLANT
NS IRRIG 1000ML POUR BTL (IV SOLUTION) ×2 IMPLANT
PAD ARMBOARD 7.5X6 YLW CONV (MISCELLANEOUS) ×2 IMPLANT
POUCH RETRIEVAL ECOSAC 10 (ENDOMECHANICALS) IMPLANT
POUCH RETRIEVAL ECOSAC 10MM (ENDOMECHANICALS)
POUCH SPECIMEN RETRIEVAL 10MM (ENDOMECHANICALS) ×1 IMPLANT
SCISSORS LAP 5X35 DISP (ENDOMECHANICALS) ×2 IMPLANT
SET CHOLANGIOGRAPH 5 50 .035 (SET/KITS/TRAYS/PACK) ×2 IMPLANT
SET IRRIG TUBING LAPAROSCOPIC (IRRIGATION / IRRIGATOR) ×2 IMPLANT
SLEEVE ENDOPATH XCEL 5M (ENDOMECHANICALS) ×2 IMPLANT
SPECIMEN JAR SMALL (MISCELLANEOUS) ×2 IMPLANT
SPONGE GAUZE 2X2 STER 10/PKG (GAUZE/BANDAGES/DRESSINGS) ×4
SPONGE LAP 18X18 RF (DISPOSABLE) ×1 IMPLANT
STRIP CLOSURE SKIN 1/2X4 (GAUZE/BANDAGES/DRESSINGS) ×2 IMPLANT
SUT MNCRL AB 4-0 PS2 18 (SUTURE) ×2 IMPLANT
TAPE STRIPS DRAPE STRL (GAUZE/BANDAGES/DRESSINGS) ×4 IMPLANT
TOWEL OR 17X24 6PK STRL BLUE (TOWEL DISPOSABLE) ×2 IMPLANT
TOWEL OR 17X26 10 PK STRL BLUE (TOWEL DISPOSABLE) ×2 IMPLANT
TRAY LAPAROSCOPIC MC (CUSTOM PROCEDURE TRAY) ×2 IMPLANT
TROCAR XCEL BLUNT TIP 100MML (ENDOMECHANICALS) ×2 IMPLANT
TROCAR XCEL NON-BLD 11X100MML (ENDOMECHANICALS) ×2 IMPLANT
TROCAR XCEL NON-BLD 5MMX100MML (ENDOMECHANICALS) ×2 IMPLANT
TUBING INSUFFLATION (TUBING) ×2 IMPLANT
WATER STERILE IRR 1000ML POUR (IV SOLUTION) ×2 IMPLANT

## 2018-03-09 NOTE — Consult Note (Signed)
[facility] Renal Consultation Note  Indication for Consultation:  Management of ESRD/hemodialysis; anemia, hypertension/volume and secondary hyperparathyroidism  HPI: Janet Mitchell is a 52 y.o. female with ESRD thought 2/2 DM/HTN with past medical history significant for obesity, hypertension, diabetes mellitus and also a family history of kidney disease.   She is admitted  Sp scheduled Laparoscopic  cholecystectomy.  We are asked to see for ESRD / Dialysis care.  She started peritoneal dialysis in 2017 in [place].Moved back to [place] area this summer with Dr Dr. Lowanda Foster following her on CCPD . She reports last PD session last night using 2.5% bags without problems . Pre op K 3.4 , HGB  7.5 .   Now She denies any cp, sob, melena, was having abdominal discomfort before surgery now just minimal post op pain .        Past Medical History:  Diagnosis Date  . Anemia   . CHF (congestive heart failure) ([place])   . Chronic cholecystitis with calculus   . Complication of anesthesia   . Diabetes mellitus without complication ([place])   . Headache   . Hypertension   . Renal disorder   . Spinal headache   . Transfusion history     Past Surgical History:  Procedure Laterality Date  . AMPUTATION TOE Left    Great left toe 2013.  Marland Kitchen CATARACT EXTRACTION     right  . CESAREAN SECTION     x2. 1994 and 1998  . FLEXIBLE SIGMOIDOSCOPY N/A 11/24/2017   Procedure: FLEXIBLE SIGMOIDOSCOPY;  Surgeon: Daneil Dolin, MD;  [facility];  Service: Endoscopy;  Laterality: N/A;  . TOTAL HIP ARTHROPLASTY Left 1997      Family History  Problem Relation Age of Onset  . Heart disease Mother   . Thrombocytopenia Mother        TTP  . Heart failure Father   . Kidney disease Paternal Grandfather       reports that she has never smoked. She has never used smokeless tobacco. She reports previous alcohol use. She reports previous drug use.  No Known Allergies  Prior to  Admission medications   Medication Sig Start Date End Date Taking? Authorizing Provider  amLODipine (NORVASC) 10 MG tablet Take 1 tablet (10 mg total) by mouth daily. 11/09/17  Yes Emokpae, Courage, MD  aspirin EC 81 MG tablet Take 81 mg by mouth at bedtime.    Yes [provider]  doxazosin (CARDURA) 4 MG tablet Take 4 mg by mouth at bedtime.   Yes [provider]  HYDROcodone-acetaminophen (NORCO) 5-325 MG tablet Take 1 tablet by mouth every 4 (four) hours as needed for severe pain. 02/23/18  Yes Sherwood Gambler, MD  Insulin Glargine (TOUJEO MAX SOLOSTAR) 300 UNIT/ML SOPN Inject 14 Units into the skin at bedtime. 11/09/17  Yes Emokpae, Courage, MD  labetalol (NORMODYNE) 200 MG tablet Take 200 mg by mouth 2 (two) times daily.   Yes [provider]  calcitRIOL (ROCALTROL) 0.5 MCG capsule Take 0.5-1 mcg by mouth See admin instructions. 0.54mcg on Mondays through Fridays and take 74mcg on Saturdays and Sundays    [provider]  metoprolol succinate (TOPROL-XL) 100 MG 24 hr tablet Take 1 tablet (100 mg total) by mouth daily. Take with or immediately following a meal. Patient not taking: Reported on 02/23/2018 11/09/17   Roxan Hockey, MD  ondansetron (ZOFRAN ODT) 4 MG disintegrating tablet Take 1 tablet (4 mg total) by mouth every 8 (eight) hours as  needed for nausea or vomiting. 02/23/18   Sherwood Gambler, MD  oxyCODONE (OXY IR/ROXICODONE) 5 MG immediate release tablet Take 1 tablet (5 mg total) by mouth every 6 (six) hours as needed for severe pain. 03/09/18   Donnie Mesa, MD  polyethylene glycol-electrolytes (TRILYTE) 420 g solution Take 4,000 mLs by mouth as directed. Patient not taking: Reported on 02/23/2018 10/30/17   Carlis Stable, NP  torsemide (DEMADEX) 100 MG tablet Take 1 tablet (100 mg total) by mouth at bedtime. 11/09/17   Roxan Hockey, MD      Results for orders placed or performed during the hospital encounter of 03/09/18 (from the past 48  hour(s))  Glucose, capillary     Status: Abnormal   Collection Time: 03/09/18 11:16 AM  Result Value Ref Range   Glucose-Capillary 109 (H) 70 - 99 mg/dL  Type and screen [place]     Status: None (Preliminary result)   Collection Time: 03/09/18 11:21 AM  Result Value Ref Range   ABO/RH(D) A POS    Antibody Screen POS    Sample Expiration 03/12/2018    DAT, IgG POS    PT AG Type NEGATIVE FOR KELL ANTIGEN    Antibody Identification ANTI K    Antibody ID,T [place] ANTI K    Unit Number [id number]    Blood Component Type RED CELLS,LR    Unit division 00    Status of Unit ALLOCATED    Donor AG Type NEGATIVE FOR KELL ANTIGEN    Transfusion Status OK TO TRANSFUSE    Crossmatch Result COMPATIBLE    Unit Number [id number]    Blood Component Type RED CELLS,LR    Unit division 00    Status of Unit ALLOCATED    Donor AG Type NEGATIVE FOR KELL ANTIGEN    Transfusion Status OK TO TRANSFUSE    Crossmatch Result COMPATIBLE   I-STAT 4, (NA,K, GLUC, HGB,HCT)     Status: Abnormal   Collection Time: 03/09/18 11:27 AM  Result Value Ref Range   Sodium 137 135 - 145 mmol/L   Potassium 3.4 (L) 3.5 - 5.1 mmol/L   Glucose, Bld 106 (H) 70 - 99 mg/dL   HCT 22.0 (L) 36.0 - 46.0 %   Hemoglobin 7.5 (L) 12.0 - 15.0 g/dL  hCG, serum, qualitative     Status: None   Collection Time: 03/09/18 12:06 PM  Result Value Ref Range   Preg, Serum NEGATIVE NEGATIVE    Comment:        THE SENSITIVITY OF THIS METHODOLOGY IS >10 mIU/mL. Performed at [facility], [place] [address], [place], [place] [address]   Glucose, capillary     Status: Abnormal   Collection Time: 03/09/18  2:47 PM  Result Value Ref Range   Glucose-Capillary 134 (H) 70 - 99 mg/dL  Glucose, capillary     Status: Abnormal   Collection Time: 03/09/18  4:40 PM  Result Value Ref Range   Glucose-Capillary 133 (H) 70 - 99 mg/dL     ROS: see hpi  Physical Exam: Vitals:   03/09/18 1605 03/09/18 1641  BP: (!)  148/74 (!) 144/74  Pulse: 70 72  Resp: 13 18  Temp:    SpO2: 96% 95%     General: In room Obese , WD, WN , AAF, NAD  OX32 HEENT: [place], mmm, not ictyeric  Neck: no jvd, supple Heart: RRR, no m, r, g  Lungs: CTA  Abdomen:  obese, BS faint =+, PD cath  R lower quad, no dc at site , Non distended  , non tender  Extremities: no pedal edema  Skin: no overt  Rash,   Neuro: alert OX3, appropriate , moves all extrem.  Dialysis Access: PD cath   " OP Dialysis = CCPD  [facility]. VVK"122 pounds "/ op pd info pend  Assessment/Plan 1. ESRD -  ON ccpd  Followed by DR Lowanda Foster in [place]  Now sp Lap cholec . And noted need to rest Peritoneum / k and vol ok last CCPD last pm / NO HD access would need temp HD cath if labs and vol. Changes  2. SP  Chronic cholecystitis underwent cholecystectomy- plan per [facility] and admit team  3.  Anemia of CKD -hgb 7.5 pre op ,  per pt gets Epo q month , need to call her op unit  [facility] in am dosing/Transfuse  If HGB ,<7 ,for ESRD pt , no sob or cp fu hgb trend  4.  Hypertension/volume  -  bp ok on 3 bp meds , And vol ok  5. Metabolic bone disease -  Fu renal labs , on Rocaltrol q day ,no binders listed  6. DM type 2 - per admit   Ernest Haber, PA-C [place] [phone] 03/09/2018, 5:30 PM

## 2018-03-09 NOTE — Discharge Instructions (Signed)
[facility] ______[facility] °LAPAROSCOPIC SURGERY: POST OP INSTRUCTIONS °Always review your discharge instruction sheet given to you by the facility where your surgery was performed. °IF YOU HAVE DISABILITY OR FAMILY LEAVE FORMS, YOU MUST BRING THEM TO THE OFFICE FOR PROCESSING.   °DO NOT GIVE THEM TO YOUR DOCTOR. ° °1. A prescription for pain medication may be given to you upon discharge.  Take your pain medication as prescribed, if needed.  If narcotic pain medicine is not needed, then you may take acetaminophen (Tylenol) or ibuprofen (Advil) as needed. °2. Take your usually prescribed medications unless otherwise directed. °3. If you need a refill on your pain medication, please contact your pharmacy.  They will contact our office to request authorization. Prescriptions will not be filled after 5pm or on week-ends. °4. You should follow a light diet the first few days after arrival home, such as soup and crackers, etc.  Be sure to include lots of fluids daily. °5. Most patients will experience some swelling and bruising in the area of the incisions.  Ice packs will help.  Swelling and bruising can take several days to resolve.  °6. It is common to experience some constipation if taking pain medication after surgery.  Increasing fluid intake and taking a stool softener (such as Colace) will usually help or prevent this problem from occurring.  A mild laxative (Milk of Magnesia or Miralax) should be taken according to package instructions if there are no bowel movements after 48 hours. °7. Unless discharge instructions indicate otherwise, you may remove your bandages 24-48 hours after surgery, and you may shower at that time.  You may have steri-strips (small skin tapes) in place directly over the incision.  These strips should be left on the skin for 7-10 days.  If your surgeon used skin glue on the incision, you may shower in 24 hours.  The glue will flake off over the next 2-3 weeks.  Any sutures or  staples will be removed at the office during your follow-up visit. °8. ACTIVITIES:  You may resume regular (light) daily activities beginning the next day--such as daily self-care, walking, climbing stairs--gradually increasing activities as tolerated.  You may have sexual intercourse when it is comfortable.  Refrain from any heavy lifting or straining until approved by your doctor. °a. You may drive when you are no longer taking prescription pain medication, you can comfortably wear a seatbelt, and you can safely maneuver your car and apply brakes. °b. RETURN TO WORK:  __________________________________________________________ °9. You should see your doctor in the office for a follow-up appointment approximately 2-3 weeks after your surgery.  Make sure that you call for this appointment within a day or two after you arrive home to insure a convenient appointment time. °10. OTHER INSTRUCTIONS: __________________________________________________________________________________________________________________________ __________________________________________________________________________________________________________________________ °WHEN TO CALL YOUR DOCTOR: °1. Fever over 101.0 °2. Inability to urinate °3. Continued bleeding from incision. °4. Increased pain, redness, or drainage from the incision. °5. Increasing abdominal pain ° °The clinic staff is available to answer your questions during regular business hours.  Please don’t hesitate to call and ask to speak to one of the nurses for clinical concerns.  If you have a medical emergency, go to the nearest emergency room or call 911.  A surgeon from [facility] is always on call at the hospital. °[address], [address], [place], [place]  [address] ? [address] [address], , [place]   [address] °[phone] ? [phone] ? FAX [phone] °Web site:   [email or web address] °

## 2018-03-09 NOTE — Transfer of Care (Signed)
Immediate Anesthesia Transfer of Care Note  Patient: Janet Mitchell  Procedure(s) Performed: LAPAROSCOPIC CHOLECYSTECTOMY WITH INTRAOPERATIVE CHOLANGIOGRAM ERAS PATHWAY (N/A )  Patient Location: PACU  Anesthesia Type:General  Level of Consciousness: awake, oriented and patient cooperative  Airway & Oxygen Therapy: Patient Spontanous Breathing and Patient connected to face mask oxygen  Post-op Assessment: Report given to RN and Post -op Vital signs reviewed and stable  Post vital signs: Reviewed  Last Vitals:  Vitals Value Taken Time  BP 144/77 03/09/2018  2:47 PM  Temp 36.2 C 03/09/2018  2:47 PM  Pulse 68 03/09/2018  2:51 PM  Resp 8 03/09/2018  2:51 PM  SpO2 95 % 03/09/2018  2:51 PM  Vitals shown include unvalidated device data.  Last Pain:  Vitals:   03/09/18 1118  TempSrc: Oral         Complications: No apparent anesthesia complications

## 2018-03-09 NOTE — Anesthesia Procedure Notes (Signed)
Procedure Name: Intubation Date/Time: 03/09/2018 12:57 PM Performed by: Jenne Campus, CRNA Pre-anesthesia Checklist: Patient identified, Emergency Drugs available, Suction available and Patient being monitored Patient Re-evaluated:Patient Re-evaluated prior to induction Oxygen Delivery Method: Circle System Utilized Preoxygenation: Pre-oxygenation with 100% oxygen Induction Type: IV induction Ventilation: Mask ventilation without difficulty Laryngoscope Size: Miller and 2 Grade View: Grade I Tube type: Oral Tube size: 7.5 mm Number of attempts: 1 Airway Equipment and Method: Stylet and Oral airway Placement Confirmation: ETT inserted through vocal cords under direct vision,  positive ETCO2 and breath sounds checked- equal and bilateral Secured at: 22 cm Tube secured with: Tape Dental Injury: Teeth and Oropharynx as per pre-operative assessment

## 2018-03-09 NOTE — Consult Note (Signed)
Medical Consultation   Janet Mitchell  YTK:[id number]  DOB: [birth year]  DOA: 03/09/2018  PCP: Lucianne Lei, MD   Outpatient Specialists:  Requesting physician: Donnie Mesa, MD  Reason for consultation: Postop medical management.   History of Present Illness: Janet Mitchell is an 52 y.o. female anemia, unspecified CHF, type 2 diabetes, headaches, hypertension, ESRD on PD, chronic cholecystitis with calculus who just underwent cholecystectomy which we are being asked to see for medical management postop.  She is currently on therapy mild effects of anesthesia, but is able to answer questions.  She is oriented x4.  She denies any current headache, chest pain, sore throat, dyspnea, palpitations or dizziness.  She denies nausea, but complains of a dull RUQ pain.  Review of Systems:  ROS Unable to fully obtain due to recent anesthesia.   Past Medical History: Past Medical History:  Diagnosis Date  . Anemia   . CHF (congestive heart failure) ([place])   . Chronic cholecystitis with calculus   . Complication of anesthesia   . Diabetes mellitus without complication ([place])   . Headache   . Hypertension   . Renal disorder   . Spinal headache   . Transfusion history     Past Surgical History: Past Surgical History:  Procedure Laterality Date  . AMPUTATION TOE Left    Great left toe 2013.  Marland Kitchen CATARACT EXTRACTION     right  . CESAREAN SECTION     x2. 1994 and 1998  . FLEXIBLE SIGMOIDOSCOPY N/A 11/24/2017   Procedure: FLEXIBLE SIGMOIDOSCOPY;  Surgeon: Daneil Dolin, MD;  [facility];  Service: Endoscopy;  Laterality: N/A;  . TOTAL HIP ARTHROPLASTY Left 1997     Allergies:  No Known Allergies   Social History:  reports that she has never smoked. She has never used smokeless tobacco. She reports previous alcohol use. She reports previous drug use.   Family History: Family History  Problem Relation Age of Onset  . Heart disease Mother   .  Thrombocytopenia Mother        TTP  . Heart failure Father   . Kidney disease Paternal Grandfather    Physical Exam: Vitals:   03/09/18 1515 03/09/18 1545 03/09/18 1605 03/09/18 1641  BP: (!) 147/73 (!) 148/74 (!) 148/74 (!) 144/74  Pulse: 69 70 70 72  Resp: 15 11 13 18   Temp:  (!) 97.3 F (36.3 C)    TempSrc:      SpO2: 93% 94% 96% 95%  Weight:      Height:        Constitutional: Somnolent, but arousable, oriented x 4, not in any acute distress. Eyes: PERLA, EOMI, irises appear normal, anicteric sclera,  ENMT: external ears and nose appear normal,            Lips appears normal, oropharynx mucosa, tongue, posterior pharynx appear normal  Neck: neck appears normal, no masses, normal ROM, no thyromegaly, no JVD  CVS: S1-S2 clear, no murmur rubs or gallops, no LE edema, normal pedal pulses  Respiratory: Decreased breath sounds in bases, but otherwise clear to auscultation bilaterally, no wheezing, rales or rhonchi. Respiratory effort normal. No accessory muscle use.  Abdomen: Obese, PD stoma in place, incisions from cholecystectomy, soft nontender, nondistended, normal bowel sounds, no hepatosplenomegaly, no hernias  Musculoskeletal: : no cyanosis, clubbing or edema noted bilaterally Neuro: Cranial nerves II-XII intact, strength, sensation, reflexes Psych: judgement and insight appear  normal, stable mood and affect, mental status Skin: no rashes or lesions or ulcers, no induration or nodules   Data reviewed:  I have personally reviewed following labs and imaging studies Labs:  CBC: Recent Labs  Lab 03/09/18 1127  HGB 7.5*  HCT 22.0*    Basic Metabolic Panel: Recent Labs  Lab 03/09/18 1127  NA 137  K 3.4*  GLUCOSE 106*   GFR Estimated Creatinine Clearance: 6.5 mL/min (A) (by C-G formula based on SCr of 14 mg/dL (H)). Liver Function Tests: No results for input(s): AST, ALT, ALKPHOS, BILITOT, PROT, ALBUMIN in the last 168 hours. No results for input(s): LIPASE,  AMYLASE in the last 168 hours. No results for input(s): AMMONIA in the last 168 hours. Coagulation profile No results for input(s): INR, PROTIME in the last 168 hours.  Cardiac Enzymes: No results for input(s): CKTOTAL, CKMB, CKMBINDEX, TROPONINI in the last 168 hours. BNP: Invalid input(s): POCBNP CBG: Recent Labs  Lab 03/09/18 1116 03/09/18 1447 03/09/18 1640  GLUCAP 109* 134* 133*   D-Dimer No results for input(s): DDIMER in the last 72 hours. Hgb A1c No results for input(s): HGBA1C in the last 72 hours. Lipid Profile No results for input(s): CHOL, HDL, LDLCALC, TRIG, CHOLHDL, LDLDIRECT in the last 72 hours. Thyroid function studies No results for input(s): TSH, T4TOTAL, T3FREE, THYROIDAB in the last 72 hours.  Invalid input(s): FREET3 Anemia work up No results for input(s): VITAMINB12, FOLATE, FERRITIN, TIBC, IRON, RETICCTPCT in the last 72 hours. Urinalysis No results found for: COLORURINE, APPEARANCEUR, LABSPEC, [place], GLUCOSEU, HGBUR, BILIRUBINUR, KETONESUR, PROTEINUR, UROBILINOGEN, NITRITE, [place]   Microbiology No results found for this or any previous visit (from the past 240 hour(s)).     Inpatient Medications:   Scheduled Meds: . [START ON 03/10/2018] amLODipine  10 mg Oral Daily  . aspirin EC  81 mg Oral QHS  . [START ON 03/12/2018] calcitRIOL  0.5 mcg Oral Once per day on Mon Tue Wed Thu Fri  . [START ON 03/10/2018] calcitRIOL  1 mcg Oral Once per day on Sun Sat  . doxazosin  4 mg Oral QHS  . fentaNYL      . [START ON 03/10/2018] insulin aspart  0-9 Units Subcutaneous TID WC  . [START ON 03/10/2018] insulin glargine  14 Units Subcutaneous QHS  . labetalol  200 mg Oral BID  . torsemide  100 mg Oral QHS   Continuous Infusions: .  ceFAZolin (ANCEF) IV       Radiological Exams on Admission: Dg Cholangiogram Operative  Result Date: 03/09/2018 CLINICAL DATA:  Cholelithiasis EXAM: INTRAOPERATIVE CHOLANGIOGRAM TECHNIQUE: Cholangiographic  images from the C-arm fluoroscopic device were submitted for interpretation post-operatively. Please see the procedural report for the amount of contrast and the fluoroscopy time utilized. COMPARISON:  None. FINDINGS: Contrast fills the biliary tree and duodenum without filling defects in the common bile duct. IMPRESSION: Patent biliary tree. Electronically Signed   By: Marybelle Killings M.D.   On: 03/09/2018 14:54    Impression/Recommendations Principal Problem:   Chronic cholecystitis with calculus Just underwent cholecystectomy today. Postop care per surgery. Analgesics as needed.  Active Problems:   Hypertension Continue amlodipine 10 mg p.o. daily. Continue labetalol 200 mg p.o. twice daily. Continue Cardura 4 mg p.o. at bedtime. Monitor BP and heart rate.    ESRD on peritoneal dialysis [facility]) Management per nephrology.    Type 2 diabetes mellitus ([place])  I will restart her Lantus tomorrow, since the patient has been n.p.o. for a long period. Decrease  regular insulin sliding scale to renal coverage for now.  Thank you for this consultation.  Our [facility] hospitalist team will follow the patient with you.   Time Spent: About 63 minutes   Reubin Milan M.D. [facility] 03/09/2018, 5:07 PM

## 2018-03-09 NOTE — Op Note (Signed)
Laparoscopic Cholecystectomy with IOC Procedure Note  Indications: This is a 52 year old female with DM and ESRD on peritoneal dialysis who presents with recent onset of severe epigastric and RUQ abdominal pain. She presented to the ED on 11/29 with shortness of breath, as well as abdominal pain in the RUQ and right flank. She was diagnosed with cholelithiasis as well as an upper respiratory illness. Liver function tests were normal.  She had her peritoneal dialysis catheter placed several years ago in [place]  Pre-operative Diagnosis: Chronic calculus cholecystitis    ESRD on peritoneal dialysis   Post-operative Diagnosis: Same  Surgeon: Maia Petties   Assistants: none  Anesthesia: General endotracheal anesthesia  ASA Class: 3  Procedure Details  The patient was seen again in the Holding Room. The risks, benefits, complications, treatment options, and expected outcomes were discussed with the patient. The possibilities of reaction to medication, pulmonary aspiration, perforation of viscus, bleeding, recurrent infection, finding a normal gallbladder, the need for additional procedures, failure to diagnose a condition, the possible need to convert to an open procedure, and creating a complication requiring transfusion or operation were discussed with the patient. The likelihood of improving the patient's symptoms with return to their baseline status is good.  The patient and/or family concurred with the proposed plan, giving informed consent. The site of surgery properly noted. The patient was taken to Operating Room, identified as Janet Mitchell and the procedure verified as Laparoscopic Cholecystectomy with Intraoperative Cholangiogram. A Time Out was held and the above information confirmed.  Prior to the induction of general anesthesia, antibiotic prophylaxis was administered. General endotracheal anesthesia was then administered and tolerated well. After the induction, the  abdomen was prepped with Chloraprep and draped in the sterile fashion. The patient was positioned in the supine position.  Local anesthetic agent was injected into the skin above the umbilicus and an incision made. The patient is very obese with a long torso, so we made our initial incision about 5 cm above the umbilicus.  We dissected down to the abdominal fascia with blunt dissection.  The fascia was incised vertically and we entered the peritoneal cavity bluntly.  A pursestring suture of 0-Vicryl was placed around the fascial opening.  The Hasson cannula was inserted and secured with the stay suture.  Pneumoperitoneum was then created with CO2 and tolerated well without any adverse changes in the patient's vital signs. An 11-mm port was placed in the subxiphoid position.  Two 5-mm ports were placed in the right upper quadrant. All skin incisions were infiltrated with a local anesthetic agent before making the incision and placing the trocars.   The patient apparently filled her peritoneal cavity with dialysate but did not drain prior to surgery.  She has a large amount of clear fluid within the abdomen.  We suctioned this out to allow for the surgical procedure.  The PD catheter is visualized in the right lower quadrant.  We positioned the patient in reverse Trendelenburg, tilted slightly to the patient's left.  The gallbladder was identified, the fundus grasped and retracted cephalad. Adhesions were lysed bluntly and with the electrocautery where indicated, taking care not to injure any adjacent organs or viscus. The infundibulum was grasped and retracted laterally, exposing the peritoneum overlying the triangle of Calot. This was then divided and exposed in a blunt fashion. A critical view of the cystic duct and cystic artery was obtained.  The cystic duct was clearly identified and bluntly dissected circumferentially. The cystic duct  was ligated with a clip distally.   An incision was made in the cystic  duct and the [facility] cholangiogram catheter introduced. The catheter was secured using a clip. A cholangiogram was then obtained which showed good visualization of the distal and proximal biliary tree with no sign of filling defects or obstruction.  Contrast flowed easily into the duodenum. The catheter was then removed.   The cystic duct was then ligated with clips and divided. The cystic artery was identified, dissected free, ligated with clips and divided as well.   The gallbladder was dissected from the liver bed in retrograde fashion with the electrocautery. The gallbladder was removed and placed in an Endocatch sac. The liver bed was irrigated and inspected. Hemostasis was achieved with the electrocautery. Copious irrigation was utilized and was repeatedly aspirated until clear.  The gallbladder and Endocatch sac were then removed through the umbilical port site.  The pursestring suture was used to close the umbilical fascia.    We again inspected the right upper quadrant for hemostasis.  Pneumoperitoneum was released as we removed the trocars.  4-0 Monocryl was used to close the skin.   Benzoin, steri-strips, and clean dressings were applied. The patient was then extubated and brought to the recovery room in stable condition. Instrument, sponge, and needle counts were correct at closure and at the conclusion of the case.   Findings: Cholecystitis with Cholelithiasis  Estimated Blood Loss: Minimal         Drains: PD catheter         Specimens: Gallbladder           Complications: None; patient tolerated the procedure well.         Disposition: PACU - hemodynamically stable.         Condition: stable  Janet Burn. Georgette Dover, MD, [facility] [phone]  03/09/2018 2:17 PM

## 2018-03-09 NOTE — Interval H&P Note (Signed)
History and Physical Interval Note:  03/09/2018 12:03 PM  Janet Mitchell  has presented today for surgery, with the diagnosis of Chronic calculus cholecystitis  The various methods of treatment have been discussed with the patient and family. After consideration of risks, benefits and other options for treatment, the patient has consented to  Procedure(s): LAPAROSCOPIC CHOLECYSTECTOMY WITH INTRAOPERATIVE CHOLANGIOGRAM ERAS PATHWAY (N/A) as a surgical intervention .  The patient's history has been reviewed, patient examined, no change in status, stable for surgery.  I have reviewed the patient's chart and labs.  Questions were answered to the patient's satisfaction.     Maia Petties

## 2018-03-10 DIAGNOSIS — N186 End stage renal disease: Secondary | ICD-10-CM

## 2018-03-10 DIAGNOSIS — Z794 Long term (current) use of insulin: Secondary | ICD-10-CM

## 2018-03-10 DIAGNOSIS — Z992 Dependence on renal dialysis: Secondary | ICD-10-CM

## 2018-03-10 DIAGNOSIS — I10 Essential (primary) hypertension: Secondary | ICD-10-CM

## 2018-03-10 DIAGNOSIS — E119 Type 2 diabetes mellitus without complications: Secondary | ICD-10-CM

## 2018-03-10 LAB — CBC
HCT: 23.8 % — ABNORMAL LOW (ref 36.0–46.0)
Hemoglobin: 6.9 g/dL — CL (ref 12.0–15.0)
MCH: 22.8 pg — AB (ref 26.0–34.0)
MCHC: 29 g/dL — ABNORMAL LOW (ref 30.0–36.0)
MCV: 78.8 fL — ABNORMAL LOW (ref 80.0–100.0)
Platelets: 152 10*3/uL (ref 150–400)
RBC: 3.02 MIL/uL — ABNORMAL LOW (ref 3.87–5.11)
RDW: 17.3 % — ABNORMAL HIGH (ref 11.5–15.5)
WBC: 5.9 10*3/uL (ref 4.0–10.5)
nRBC: 0 % (ref 0.0–0.2)

## 2018-03-10 LAB — BASIC METABOLIC PANEL
Anion gap: 18 — ABNORMAL HIGH (ref 5–15)
BUN: 51 mg/dL — ABNORMAL HIGH (ref 6–20)
CO2: 24 mmol/L (ref 22–32)
Calcium: 8.4 mg/dL — ABNORMAL LOW (ref 8.9–10.3)
Chloride: 92 mmol/L — ABNORMAL LOW (ref 98–111)
Creatinine, Ser: 13.31 mg/dL — ABNORMAL HIGH (ref 0.44–1.00)
GFR calc Af Amer: 3 mL/min — ABNORMAL LOW (ref >60)
GFR calc non Af Amer: 3 mL/min — ABNORMAL LOW (ref >60)
Glucose, Bld: 137 mg/dL — ABNORMAL HIGH (ref 70–99)
Potassium: 4 mmol/L (ref 3.5–5.1)
Sodium: 134 mmol/L — ABNORMAL LOW (ref 135–145)

## 2018-03-10 LAB — GLUCOSE, CAPILLARY
Glucose-Capillary: 121 mg/dL — ABNORMAL HIGH (ref 70–99)
Glucose-Capillary: 107 mg/dL — ABNORMAL HIGH (ref 70–99)
Glucose-Capillary: 127 mg/dL — ABNORMAL HIGH (ref 70–99)
Glucose-Capillary: 115 mg/dL — ABNORMAL HIGH (ref 70–99)

## 2018-03-10 LAB — PHOSPHORUS: Phosphorus: 10.4 mg/dL — ABNORMAL HIGH (ref 2.5–4.6)

## 2018-03-10 LAB — PREPARE RBC (CROSSMATCH)

## 2018-03-10 MED ORDER — HYDRALAZINE HCL 20 MG/ML IJ SOLN: 5.0000 mg | Freq: Four times a day (QID) | INTRAMUSCULAR | Status: DC | PRN

## 2018-03-10 MED ORDER — SODIUM CHLORIDE 0.9% IV SOLUTION
Freq: Once | INTRAVENOUS | Status: AC
  Administered 2018-03-10: 16:00:00 via INTRAVENOUS

## 2018-03-10 NOTE — Progress Notes (Signed)
PROGRESS NOTE    Janet Mitchell  QIH:[id number]  DOB: Jul 09, 1965  DOA: 03/09/2018 PCP: Lucianne Lei, MD    Subjective: Seen for medical consult follow-up  Patient resting comfortably and states will likely go home in a.m.  Now eating regular diet  Objective: Vitals:   03/10/18 0936 03/10/18 0937 03/10/18 1506 03/10/18 1545  BP:  (!) 147/74 137/76 (!) 144/72  Pulse: 74 73 75 73  Resp:  18  18  Temp:  98 F (36.7 C) 98.2 F (36.8 C) 97.8 F (36.6 C)  TempSrc:  Oral Oral Oral  SpO2: 93% 92% 97% 99%  Weight:      Height:        Intake/Output Summary (Last 24 hours) at 03/10/2018 1754 Last data filed at 03/10/2018 0900 Gross per 24 hour  Intake 840 ml  Output -  Net 840 ml   Filed Weights   03/09/18 1118 03/09/18 2129  Weight: 117 kg 116.2 kg    Physical Examination:  General exam: Appears calm and comfortable  Respiratory system: Clear to auscultation. Respiratory effort normal. Cardiovascular system: S1 & S2 heard, RRR. No JVD, murmurs, rubs, gallops or clicks. No pedal edema. Gastrointestinal system: Abdomen is nondistended, dressing at laparoscopic entry sites, soft and nontender. No organomegaly or masses felt. Normal bowel sounds heard. Central nervous system: Alert and oriented. No focal neurological deficits. Extremities: Symmetric 5 x 5 power. Skin: No rashes, lesions or ulcers Psychiatry: Judgement and insight appear normal. Mood & affect appropriate.     Data Reviewed: I have personally reviewed following labs and imaging studies  CBC: Recent Labs  Lab 03/09/18 1127 03/10/18 0720  WBC  --  5.9  HGB 7.5* 6.9*  HCT 22.0* 23.8*  MCV  --  78.8*  PLT  --  875   Basic Metabolic Panel: Recent Labs  Lab 03/09/18 1127 03/10/18 0720  NA 137 134*  K 3.4* 4.0  CL  --  92*  CO2  --  24  GLUCOSE 106* 137*  BUN  --  51*  CREATININE  --  13.31*  CALCIUM  --  8.4*  PHOS  --  10.4*   GFR: Estimated Creatinine Clearance: 6.8 mL/min (A) (by  C-G formula based on SCr of 13.31 mg/dL (H)). Liver Function Tests: No results for input(s): AST, ALT, ALKPHOS, BILITOT, PROT, ALBUMIN in the last 168 hours. No results for input(s): LIPASE, AMYLASE in the last 168 hours. No results for input(s): AMMONIA in the last 168 hours. Coagulation Profile: No results for input(s): INR, PROTIME in the last 168 hours. Cardiac Enzymes: No results for input(s): CKTOTAL, CKMB, CKMBINDEX, TROPONINI in the last 168 hours. BNP (last 3 results) No results for input(s): PROBNP in the last 8760 hours. HbA1C: No results for input(s): HGBA1C in the last 72 hours. CBG: Recent Labs  Lab 03/09/18 1640 03/09/18 2131 03/10/18 0754 03/10/18 1142 03/10/18 1618  GLUCAP 133* 225* 121* 107* 127*   Lipid Profile: No results for input(s): CHOL, HDL, LDLCALC, TRIG, CHOLHDL, LDLDIRECT in the last 72 hours. Thyroid Function Tests: No results for input(s): TSH, T4TOTAL, FREET4, T3FREE, THYROIDAB in the last 72 hours. Anemia Panel: No results for input(s): VITAMINB12, FOLATE, FERRITIN, TIBC, IRON, RETICCTPCT in the last 72 hours. Sepsis Labs: No results for input(s): PROCALCITON, LATICACIDVEN in the last 168 hours.  No results found for this or any previous visit (from the past 240 hour(s)).    Radiology Studies: Dg Cholangiogram Operative  Result Date: 03/09/2018 CLINICAL DATA:  Cholelithiasis  EXAM: INTRAOPERATIVE CHOLANGIOGRAM TECHNIQUE: Cholangiographic images from the C-arm fluoroscopic device were submitted for interpretation post-operatively. Please see the procedural report for the amount of contrast and the fluoroscopy time utilized. COMPARISON:  None. FINDINGS: Contrast fills the biliary tree and duodenum without filling defects in the common bile duct. IMPRESSION: Patent biliary tree. Electronically Signed   By: Marybelle Killings M.D.   On: 03/09/2018 14:54        Scheduled Meds: . amLODipine  10 mg Oral Daily  . aspirin EC  81 mg Oral QHS  . [START  ON 03/12/2018] calcitRIOL  0.5 mcg Oral Once per day on Mon Tue Wed Thu Fri  . calcitRIOL  1 mcg Oral Once per day on Sun Sat  . doxazosin  4 mg Oral QHS  . insulin aspart  0-9 Units Subcutaneous TID WC  . insulin glargine  14 Units Subcutaneous QHS  . labetalol  200 mg Oral BID  . torsemide  100 mg Oral QHS   Continuous Infusions:  Assessment & Plan:    1.  Hypertension: Patient states her baseline systolic blood pressure is around 1 40-1 50.  Continue current home medications.  2.  End-stage renal disease on peritoneal dialysis: Nephrology following.  3.  Acute on chronic anemia: 1 unit PRBC ordered by nephrology.  4.  Diabetes mellitus: Continue diabetic diet and resume home dose of insulin today.  5.  Chronic cholecystitis: Status post cholecystectomy.  Management per primary service      LOS: 1 day    Time spent:     Guilford Shi, MD [facility] Pager [phone]  If 7PM-7AM, please contact night-coverage [email or web address] Password [facility] 03/10/2018, 5:54 PM

## 2018-03-10 NOTE — Progress Notes (Signed)
1 Day Post-Op   Subjective/Chief Complaint: Feels well with minimal abdominal pain.   Objective: Vital signs in last 24 hours: Temp:  [97.2 F (36.2 C)-98.2 F (36.8 C)] 98 F (36.7 C) (12/14 0937) Pulse Rate:  [69-78] 73 (12/14 0937) Resp:  [7-18] 18 (12/14 0937) BP: (144-161)/(69-89) 147/74 (12/14 0937) SpO2:  [91 %-96 %] 92 % (12/14 0937) Weight:  [116.2 kg-117 kg] 116.2 kg (12/13 2129) Last BM Date: 03/08/18  Intake/Output from previous day: 12/13 0701 - 12/14 0700 In: 780 [P.O.:480; I.V.:300] Out: 5 [Blood:5] Intake/Output this shift: No intake/output data recorded.  Incision/Wound: Incisions clean dry and intact.  PD catheter noted right lower quadrant.  No bile or foul-smelling drainage from this.  Soft with minimal tenderness to palpation.  Lab Results:  Recent Labs    03/09/18 1127 03/10/18 0720  WBC  --  5.9  HGB 7.5* 6.9*  HCT 22.0* 23.8*  PLT  --  152   BMET Recent Labs    03/09/18 1127 03/10/18 0720  NA 137 134*  K 3.4* 4.0  CL  --  92*  CO2  --  24  GLUCOSE 106* 137*  BUN  --  51*  CREATININE  --  13.31*  CALCIUM  --  8.4*   PT/INR No results for input(s): LABPROT, INR in the last 72 hours. ABG No results for input(s): PHART, HCO3 in the last 72 hours.  Invalid input(s): PCO2, PO2  Studies/Results: Dg Cholangiogram Operative  Result Date: 03/09/2018 CLINICAL DATA:  Cholelithiasis EXAM: INTRAOPERATIVE CHOLANGIOGRAM TECHNIQUE: Cholangiographic images from the C-arm fluoroscopic device were submitted for interpretation post-operatively. Please see the procedural report for the amount of contrast and the fluoroscopy time utilized. COMPARISON:  None. FINDINGS: Contrast fills the biliary tree and duodenum without filling defects in the common bile duct. IMPRESSION: Patent biliary tree. Electronically Signed   By: Marybelle Killings M.D.   On: 03/09/2018 14:54    Anti-infectives: Anti-infectives (From admission, onward)   Start     Dose/Rate Route  Frequency Ordered Stop   03/09/18 1645  ceFAZolin (ANCEF) IVPB 2g/100 mL premix     2 g 200 mL/hr over 30 Minutes Intravenous Every 8 hours 03/09/18 1634 03/09/18 1933   03/09/18 1200  ceFAZolin (ANCEF) 3 g in dextrose 5 % 50 mL IVPB     3 g 100 mL/hr over 30 Minutes Intravenous To [facility] 03/08/18 1315 03/09/18 1300      Assessment/Plan: s/p Procedure(s): LAPAROSCOPIC CHOLECYSTECTOMY WITH INTRAOPERATIVE CHOLANGIOGRAM ERAS PATHWAY (N/A) Advance diet  Saline lock IV  Continue to follow laboratory work.  Discussed with Dr. Justin Mend and he may consider transfusion since her hemoglobin is down below 7.  Her baseline yesterday was 7.5 and today is 6.9.  This is more than likely secondary to hemodilution since there is no bleeding from her PD catheter at this point in time or evidence of bleeding on examination today.  Hold PD exchange for at least another 24 to 48 hours.  LOS: 1 day    Joyice Faster Lovenia Debruler 03/10/2018

## 2018-03-10 NOTE — Progress Notes (Signed)
[facility] ROUNDING NOTE   Subjective:   Patient doing well status post laparoscopic cholecystectomy.  Medical management ESRD patient of Dr. Lowanda Foster  Blood pressure 1 47/74 pulse 73 temperature 98 O2 sats 90% room air  Sodium 134 potassium 4.0 chloride 92 CO2 24 BUN 51 creatinine 13.31 glucose 137 calcium 8.4 phosphorus 10.4 WBC 5.9 hemoglobin 6.9 platelets 152      Objective:  Vital signs in last 24 hours:  Temp:  [97.2 F (36.2 C)-98.2 F (36.8 C)] 98 F (36.7 C) (12/14 0937) Pulse Rate:  [69-78] 73 (12/14 0937) Resp:  [7-18] 18 (12/14 0937) BP: (144-161)/(69-89) 147/74 (12/14 0937) SpO2:  [91 %-96 %] 92 % (12/14 0937) Weight:  [116.2 kg] 116.2 kg (12/13 2129)  Weight change:  Filed Weights   03/09/18 1118 03/09/18 2129  Weight: 117 kg 116.2 kg    Intake/Output: I/O last 3 completed shifts: In: 55 [P.O.:480; I.V.:300] Out: 5 [Blood:5]   Intake/Output this shift:  Total I/O In: 360 [P.O.:360] Out: -   CVS- RRR no murmurs rubs gallops RS- CTA clear to auscultation no wheezes or rales ABD- BS present soft non-distended  PD catheter site clean EXT- no edema   Basic Metabolic Panel: Recent Labs  Lab 03/09/18 1127 03/10/18 0720  NA 137 134*  K 3.4* 4.0  CL  --  92*  CO2  --  24  GLUCOSE 106* 137*  BUN  --  51*  CREATININE  --  13.31*  CALCIUM  --  8.4*  PHOS  --  10.4*    Liver Function Tests: No results for input(s): AST, ALT, ALKPHOS, BILITOT, PROT, ALBUMIN in the last 168 hours. No results for input(s): LIPASE, AMYLASE in the last 168 hours. No results for input(s): AMMONIA in the last 168 hours.  CBC: Recent Labs  Lab 03/09/18 1127 03/10/18 0720  WBC  --  5.9  HGB 7.5* 6.9*  HCT 22.0* 23.8*  MCV  --  78.8*  PLT  --  152    Cardiac Enzymes: No results for input(s): CKTOTAL, CKMB, CKMBINDEX, TROPONINI in the last 168 hours.  BNP: Invalid input(s): POCBNP  CBG: Recent Labs  Lab 03/09/18 1447 03/09/18 1640  03/09/18 2131 03/10/18 0754 03/10/18 1142  GLUCAP 134* 133* 225* 121* 107*    Microbiology: Results for orders placed or performed during the hospital encounter of 02/23/18  Culture, blood (routine x 2)     Status: None   Collection Time: 02/23/18  8:31 PM  Result Value Ref Range Status   Specimen Description BLOOD LEFT ANTECUBITAL  Final   Special Requests   Final    BOTTLES DRAWN AEROBIC AND ANAEROBIC Blood Culture adequate volume   Culture   Final    NO GROWTH 5 DAYS Performed at [facility], [place] [address], [place], [place] [address]    Report Status 02/28/2018 FINAL  Final  Culture, blood (routine x 2)     Status: None   Collection Time: 02/23/18  8:45 PM  Result Value Ref Range Status   Specimen Description BLOOD RIGHT HAND  Final   Special Requests   Final    BOTTLES DRAWN AEROBIC AND ANAEROBIC Blood Culture adequate volume   Culture   Final    NO GROWTH 5 DAYS Performed at [facility], [place] [address], [place], [place] [address]    Report Status 02/28/2018 FINAL  Final    Coagulation Studies: No results for input(s): LABPROT, INR in the last 72  hours.  Urinalysis: No results for input(s): COLORURINE, LABSPEC, PHURINE, GLUCOSEU, HGBUR, BILIRUBINUR, KETONESUR, PROTEINUR, UROBILINOGEN, NITRITE, LEUKOCYTESUR in the last 72 hours.  Invalid input(s): APPERANCEUR    Imaging: Dg Cholangiogram Operative  Result Date: 03/09/2018 CLINICAL DATA:  Cholelithiasis EXAM: INTRAOPERATIVE CHOLANGIOGRAM TECHNIQUE: Cholangiographic images from the C-arm fluoroscopic device were submitted for interpretation post-operatively. Please see the procedural report for the amount of contrast and the fluoroscopy time utilized. COMPARISON:  None. FINDINGS: Contrast fills the biliary tree and duodenum without filling defects in the common bile duct. IMPRESSION: Patent biliary tree. Electronically Signed   By: Marybelle Killings M.D.   On: 03/09/2018 14:54     Medications:    .  sodium chloride   Intravenous Once  . amLODipine  10 mg Oral Daily  . aspirin EC  81 mg Oral QHS  . [START ON 03/12/2018] calcitRIOL  0.5 mcg Oral Once per day on Mon Tue Wed Thu Fri  . calcitRIOL  1 mcg Oral Once per day on Sun Sat  . doxazosin  4 mg Oral QHS  . insulin aspart  0-9 Units Subcutaneous TID WC  . insulin glargine  14 Units Subcutaneous QHS  . labetalol  200 mg Oral BID  . torsemide  100 mg Oral QHS   acetaminophen **OR** acetaminophen, diphenhydrAMINE **OR** diphenhydrAMINE, hydrALAZINE, morphine injection, ondansetron **OR** ondansetron (ZOFRAN) IV, oxyCODONE  Assessment/ Plan:    End-stage renal disease  resting peritoneum following surgery.   Volume/hypertension appears to be doing well no issues with volume overload at this present time.  Anemia hemoglobin dropped less than 7 transfuse 1 units packed red blood cells  Status post laparoscopic cholecystectomy  Metabolic bone disease stable continues on Rocaltrol  Diabetes mellitus appears to be stable  Patient will be able to restart peritoneal dialysis tomorrow plans for discharge in a.m.   LOS: [place] @TODAY @1 :44 PM

## 2018-03-10 NOTE — Anesthesia Postprocedure Evaluation (Signed)
Anesthesia Post Note  Patient: Janet Mitchell  Procedure(s) Performed: LAPAROSCOPIC CHOLECYSTECTOMY WITH INTRAOPERATIVE CHOLANGIOGRAM ERAS PATHWAY (N/A )     Patient location during evaluation: PACU Anesthesia Type: General Level of consciousness: awake and alert Pain management: pain level controlled Vital Signs Assessment: post-procedure vital signs reviewed and stable Respiratory status: spontaneous breathing, nonlabored ventilation, respiratory function stable and patient connected to nasal cannula oxygen Cardiovascular status: blood pressure returned to baseline and stable Postop Assessment: no apparent nausea or vomiting Anesthetic complications: no    Last Vitals:  Vitals:   03/09/18 2129 03/10/18 0343  BP: (!) 148/84 (!) 161/89  Pulse: 78 77  Resp: 16 18  Temp: 36.6 C 36.8 C  SpO2: 96% 95%    Last Pain:  Vitals:   03/10/18 0343  TempSrc: Oral  PainSc:                  Ryan P Ellender

## 2018-03-11 ENCOUNTER — Encounter (HOSPITAL_COMMUNITY): Payer: Self-pay | Admitting: Surgery

## 2018-03-11 LAB — BASIC METABOLIC PANEL
Anion gap: 17 — ABNORMAL HIGH (ref 5–15)
BUN: 59 mg/dL — ABNORMAL HIGH (ref 6–20)
CO2: 24 mmol/L (ref 22–32)
CREATININE: 14.88 mg/dL — AB (ref 0.44–1.00)
Calcium: 8.3 mg/dL — ABNORMAL LOW (ref 8.9–10.3)
Chloride: 92 mmol/L — ABNORMAL LOW (ref 98–111)
GFR calc Af Amer: 3 mL/min — ABNORMAL LOW (ref >60)
GFR calc non Af Amer: 3 mL/min — ABNORMAL LOW (ref >60)
Glucose, Bld: 81 mg/dL (ref 70–99)
Potassium: 3.8 mmol/L (ref 3.5–5.1)
Sodium: 133 mmol/L — ABNORMAL LOW (ref 135–145)

## 2018-03-11 LAB — COMPREHENSIVE METABOLIC PANEL
ALT: 8 U/L (ref 0–44)
AST: 24 U/L (ref 15–41)
Albumin: 2.2 g/dL — ABNORMAL LOW (ref 3.5–5.0)
Alkaline Phosphatase: 55 U/L (ref 38–126)
Anion gap: 17 — ABNORMAL HIGH (ref 5–15)
BUN: 58 mg/dL — ABNORMAL HIGH (ref 6–20)
CO2: 24 mmol/L (ref 22–32)
Calcium: 8.2 mg/dL — ABNORMAL LOW (ref 8.9–10.3)
Chloride: 91 mmol/L — ABNORMAL LOW (ref 98–111)
Creatinine, Ser: 14.44 mg/dL — ABNORMAL HIGH (ref 0.44–1.00)
GFR calc Af Amer: 3 mL/min — ABNORMAL LOW (ref >60)
GFR calc non Af Amer: 3 mL/min — ABNORMAL LOW (ref >60)
Glucose, Bld: 125 mg/dL — ABNORMAL HIGH (ref 70–99)
Potassium: 3.6 mmol/L (ref 3.5–5.1)
Sodium: 132 mmol/L — ABNORMAL LOW (ref 135–145)
Total Bilirubin: 0.4 mg/dL (ref 0.3–1.2)
Total Protein: 6.2 g/dL — ABNORMAL LOW (ref 6.5–8.1)

## 2018-03-11 LAB — GLUCOSE, CAPILLARY
Glucose-Capillary: 66 mg/dL — ABNORMAL LOW (ref 70–99)
Glucose-Capillary: 66 mg/dL — ABNORMAL LOW (ref 70–99)
Glucose-Capillary: 95 mg/dL (ref 70–99)
Glucose-Capillary: 83 mg/dL (ref 70–99)

## 2018-03-11 LAB — CBC
HCT: 23.1 % — ABNORMAL LOW (ref 36.0–46.0)
Hemoglobin: 7 g/dL — ABNORMAL LOW (ref 12.0–15.0)
MCH: 23.8 pg — ABNORMAL LOW (ref 26.0–34.0)
MCHC: 30.3 g/dL (ref 30.0–36.0)
MCV: 78.6 fL — ABNORMAL LOW (ref 80.0–100.0)
NRBC: 0 % (ref 0.0–0.2)
Platelets: 144 10*3/uL — ABNORMAL LOW (ref 150–400)
RBC: 2.94 MIL/uL — ABNORMAL LOW (ref 3.87–5.11)
RDW: 17.1 % — AB (ref 11.5–15.5)
WBC: 5.5 10*3/uL (ref 4.0–10.5)

## 2018-03-11 NOTE — Progress Notes (Signed)
PROGRESS NOTE    Janet Mitchell  GLO:[id number]  DOB: May 31, 1965  DOA: 03/09/2018 PCP: Lucianne Lei, MD    Subjective: Seen for medical consult follow-up  Patient resting comfortably and states that she is being discharged today.  Denies having any complaints at this time.  Objective: Vitals:   03/10/18 1847 03/10/18 2127 03/11/18 0451 03/11/18 0928  BP: (!) 156/86 (!) 148/81 128/70 140/77  Pulse: 72 72 68 72  Resp: 18 18 18 18   Temp: 98.1 F (36.7 C) 97.7 F (36.5 C) 97.7 F (36.5 C) 97.9 F (36.6 C)  TempSrc: Oral Oral Oral Oral  SpO2: 99% 90% 91% 96%  Weight:  116.2 kg    Height:        Intake/Output Summary (Last 24 hours) at 03/11/2018 1045 Last data filed at 03/11/2018 0900 Gross per 24 hour  Intake 874 ml  Output 0 ml  Net 874 ml   Filed Weights   03/09/18 1118 03/09/18 2129 03/10/18 2127  Weight: 117 kg 116.2 kg 116.2 kg    Physical Examination:  General exam: Appears calm and comfortable  Respiratory system: Clear to auscultation. Respiratory effort normal. Cardiovascular system: S1 & S2 heard, RRR. No JVD, murmurs, rubs, gallops or clicks. No pedal edema. Gastrointestinal system: Abdomen is nondistended, dressing at laparoscopic entry sites, soft and nontender. No organomegaly or masses felt. Normal bowel sounds heard. Central nervous system: Alert and oriented. No focal neurological deficits. Extremities: Symmetric 5 x 5 power. Skin: No rashes, lesions or ulcers Psychiatry: Judgement and insight appear normal. Mood & affect appropriate.     Data Reviewed: I have personally reviewed following labs and imaging studies  CBC: Recent Labs  Lab 03/09/18 1127 03/10/18 0720 03/11/18 0544  WBC  --  5.9 5.5  HGB 7.5* 6.9* 7.0*  HCT 22.0* 23.8* 23.1*  MCV  --  78.8* 78.6*  PLT  --  152 188*   Basic Metabolic Panel: Recent Labs  Lab 03/09/18 1127 03/10/18 0720 03/10/18 2329 03/11/18 0544  NA 137 134* 132* 133*  K 3.4* 4.0 3.6 3.8  CL   --  92* 91* 92*  CO2  --  24 24 24   GLUCOSE 106* 137* 125* 81  BUN  --  51* 58* 59*  CREATININE  --  13.31* 14.44* 14.88*  CALCIUM  --  8.4* 8.2* 8.3*  PHOS  --  10.4*  --   --    GFR: Estimated Creatinine Clearance: 6.1 mL/min (A) (by C-G formula based on SCr of 14.88 mg/dL (H)). Liver Function Tests: Recent Labs  Lab 03/10/18 2329  AST 24  ALT 8  ALKPHOS 55  BILITOT 0.4  PROT 6.2*  ALBUMIN 2.2*   No results for input(s): LIPASE, AMYLASE in the last 168 hours. No results for input(s): AMMONIA in the last 168 hours. Coagulation Profile: No results for input(s): INR, PROTIME in the last 168 hours. Cardiac Enzymes: No results for input(s): CKTOTAL, CKMB, CKMBINDEX, TROPONINI in the last 168 hours. BNP (last 3 results) No results for input(s): PROBNP in the last 8760 hours. HbA1C: No results for input(s): HGBA1C in the last 72 hours. CBG: Recent Labs  Lab 03/10/18 1618 03/10/18 2128 03/11/18 0724 03/11/18 0753 03/11/18 0928  GLUCAP 127* 115* 66* 66* 95   Lipid Profile: No results for input(s): CHOL, HDL, LDLCALC, TRIG, CHOLHDL, LDLDIRECT in the last 72 hours. Thyroid Function Tests: No results for input(s): TSH, T4TOTAL, FREET4, T3FREE, THYROIDAB in the last 72 hours. Anemia Panel: No results for  input(s): VITAMINB12, FOLATE, FERRITIN, TIBC, IRON, RETICCTPCT in the last 72 hours. Sepsis Labs: No results for input(s): PROCALCITON, LATICACIDVEN in the last 168 hours.  No results found for this or any previous visit (from the past 240 hour(s)).    Radiology Studies: Dg Cholangiogram Operative  Result Date: 03/09/2018 CLINICAL DATA:  Cholelithiasis EXAM: INTRAOPERATIVE CHOLANGIOGRAM TECHNIQUE: Cholangiographic images from the C-arm fluoroscopic device were submitted for interpretation post-operatively. Please see the procedural report for the amount of contrast and the fluoroscopy time utilized. COMPARISON:  None. FINDINGS: Contrast fills the biliary tree and  duodenum without filling defects in the common bile duct. IMPRESSION: Patent biliary tree. Electronically Signed   By: Marybelle Killings M.D.   On: 03/09/2018 14:54        Scheduled Meds: . amLODipine  10 mg Oral Daily  . aspirin EC  81 mg Oral QHS  . [START ON 03/12/2018] calcitRIOL  0.5 mcg Oral Once per day on Mon Tue Wed Thu Fri  . calcitRIOL  1 mcg Oral Once per day on Sun Sat  . doxazosin  4 mg Oral QHS  . insulin aspart  0-9 Units Subcutaneous TID WC  . insulin glargine  14 Units Subcutaneous QHS  . labetalol  200 mg Oral BID  . torsemide  100 mg Oral QHS   Continuous Infusions:  Assessment & Plan:    1.  Hypertension: Patient states her baseline systolic blood pressure is around 1 40-1 50.  Continue current home medications. 03/11/18: -Patient advised to follow-up with her primary care physician, nephrologist to have her blood pressure monitored in the outpatient setting and medication adjusted accordingly.  2.  End-stage renal disease on peritoneal dialysis: Nephrology following. 03/11/18: -She says she has an appointment with her outpatient nephrologist tomorrow.  3.  Acute on chronic anemia: 1 unit PRBC ordered by nephrology. 03/11/18: - Hb stable.  4.  Diabetes mellitus: Continue diabetic diet and resume home dose of insulin today. 03/11/18: -Advised to be compliant with her diabetic diet and without insulin.  She is advised to follow-up with her primary care physician for management of her diabetes.  5.  Chronic cholecystitis: Status post cholecystectomy.  Management per primary service      LOS: 2 days    Time spent: 25 min    Yaakov Guthrie, MD [facility]  Pager on [place]  If 7PM-7AM, please contact night-coverage [email or web address] Password [email or web address] 03/11/2018, 10:45 AM

## 2018-03-11 NOTE — Progress Notes (Signed)
Janet Mitchell   Subjective:   Patient doing well status post laparoscopic cholecystectomy.  Medical management ESRD patient of Dr. Lowanda Foster  Blood pressure 140/77 pulse 72 temperature 97.9 O2 sats 96% room air  Sodium 133 potassium 3.8 chloride 92 CO2 24 BUN 59 creatinine 14.88 glucose 81 calcium 8.3 BC 5.5 hemoglobin 7.0 platelets 144.      Objective:  Vital signs in last 24 hours:  Temp:  [97.7 F (36.5 C)-98.2 F (36.8 C)] 97.9 F (36.6 C) (12/15 0928) Pulse Rate:  [68-75] 72 (12/15 0928) Resp:  [18] 18 (12/15 0928) BP: (128-156)/(70-86) 140/77 (12/15 0928) SpO2:  [90 %-99 %] 96 % (12/15 0928) Weight:  [116.2 kg] 116.2 kg (12/14 2127)  Weight change: -0.828 kg Filed Weights   03/09/18 1118 03/09/18 2129 03/10/18 2127  Weight: 117 kg 116.2 kg 116.2 kg    Intake/Output: I/O last 3 completed shifts: In: 6644 [P.O.:1080; Blood:394] Out: 0    Intake/Output this shift:  Total I/O In: 240 [P.O.:240] Out: 0   CVS- RRR no murmurs rubs gallops RS- CTA clear to auscultation no wheezes or rales ABD- BS present soft non-distended  PD catheter site clean EXT- no edema   Basic Metabolic Panel: Recent Labs  Lab 03/09/18 1127 03/10/18 0720 03/10/18 2329 03/11/18 0544  NA 137 134* 132* 133*  K 3.4* 4.0 3.6 3.8  CL  --  92* 91* 92*  CO2  --  24 24 24   GLUCOSE 106* 137* 125* 81  BUN  --  51* 58* 59*  CREATININE  --  13.31* 14.44* 14.88*  CALCIUM  --  8.4* 8.2* 8.3*  PHOS  --  10.4*  --   --     Liver Function Tests: Recent Labs  Lab 03/10/18 2329  AST 24  ALT 8  ALKPHOS 55  BILITOT 0.4  PROT 6.2*  ALBUMIN 2.2*   No results for input(s): LIPASE, AMYLASE in the last 168 hours. No results for input(s): AMMONIA in the last 168 hours.  CBC: Recent Labs  Lab 03/09/18 1127 03/10/18 0720 03/11/18 0544  WBC  --  5.9 5.5  HGB 7.5* 6.9* 7.0*  HCT 22.0* 23.8* 23.1*  MCV  --  78.8* 78.6*  PLT  --  152 144*    Cardiac  Enzymes: No results for input(s): CKTOTAL, CKMB, CKMBINDEX, TROPONINI in the last 168 hours.  BNP: Invalid input(s): POCBNP  CBG: Recent Labs  Lab 03/10/18 1618 03/10/18 2128 03/11/18 0724 03/11/18 0753 03/11/18 0928  GLUCAP 127* 115* 66* 66* 95    Microbiology: Results for orders placed or performed during the hospital encounter of 02/23/18  Culture, blood (routine x 2)     Status: None   Collection Time: 02/23/18  8:31 PM  Result Value Ref Range Status   Specimen Description BLOOD LEFT ANTECUBITAL  Final   Special Requests   Final    BOTTLES DRAWN AEROBIC AND ANAEROBIC Blood Culture adequate volume   Culture   Final    NO GROWTH 5 DAYS Performed at [facility], [place] [address], [place], [place] [address]    Report Status 02/28/2018 FINAL  Final  Culture, blood (routine x 2)     Status: None   Collection Time: 02/23/18  8:45 PM  Result Value Ref Range Status   Specimen Description BLOOD RIGHT HAND  Final   Special Requests   Final    BOTTLES DRAWN AEROBIC AND ANAEROBIC Blood Culture adequate volume   Culture  Final    NO GROWTH 5 DAYS Performed at [facility], [place] [address] [place], [place] [address]    Report Status 02/28/2018 FINAL  Final    Coagulation Studies: No results for input(s): LABPROT, INR in the last 72 hours.  Urinalysis: No results for input(s): COLORURINE, LABSPEC, PHURINE, GLUCOSEU, HGBUR, BILIRUBINUR, KETONESUR, PROTEINUR, UROBILINOGEN, NITRITE, LEUKOCYTESUR in the last 72 hours.  Invalid input(s): APPERANCEUR    Imaging: Dg Cholangiogram Operative  Result Date: 03/09/2018 CLINICAL DATA:  Cholelithiasis EXAM: INTRAOPERATIVE CHOLANGIOGRAM TECHNIQUE: Cholangiographic images from the C-arm fluoroscopic device were submitted for interpretation post-operatively. Please see the procedural report for the amount of contrast and the fluoroscopy time utilized. COMPARISON:  None. FINDINGS: Contrast fills the biliary tree and duodenum  without filling defects in the common bile duct. IMPRESSION: Patent biliary tree. Electronically Signed   By: Marybelle Killings M.D.   On: 03/09/2018 14:54     Medications:    . amLODipine  10 mg Oral Daily  . aspirin EC  81 mg Oral QHS  . [START ON 03/12/2018] calcitRIOL  0.5 mcg Oral Once per day on Mon Tue Wed Thu Fri  . calcitRIOL  1 mcg Oral Once per day on Sun Sat  . doxazosin  4 mg Oral QHS  . insulin aspart  0-9 Units Subcutaneous TID WC  . insulin glargine  14 Units Subcutaneous QHS  . labetalol  200 mg Oral BID  . torsemide  100 mg Oral QHS   acetaminophen **OR** acetaminophen, diphenhydrAMINE **OR** diphenhydrAMINE, hydrALAZINE, morphine injection, ondansetron **OR** ondansetron (ZOFRAN) IV, oxyCODONE  Assessment/ Plan:    End-stage renal disease  resting peritoneum following surgery.  She may be able to resume her CAPD low volume recommended 2 L tonight I would not put her on cycler tonight but would do 2.5% CAPD exchange 6 PM and 10 PM this evening.  She can follow-up with home training in [place] tomorrow morning 03/12/2018  Volume/hypertension appears to be doing well no issues with volume overload at this present time.  Anemia hemoglobin dropped less than 7 transfuse 1 units packed red blood cells transfusion 1 packed packed red blood cells 03/10/2018  Status post laparoscopic cholecystectomy 32/99/2426  Metabolic bone disease stable continues on Rocaltrol  Diabetes mellitus appears to be stable  Patient will be able to restart peritoneal dialysis tomorrow plans for discharge in a.m. she will follow-up with home training 03/12/2018   LOS: [place] @TODAY @10 :10 AM

## 2018-03-11 NOTE — Discharge Summary (Signed)
Physician Discharge Summary  Patient ID: Margarete Horace MRN: [id number] DOB/AGE: 50-Feb-1967 50 y.o.  Admit date: 03/09/2018 Discharge date: 03/11/2018  Admission Diagnoses: Chronic cholecystitis with calculus  Discharge Diagnoses:  Principal Problem:   Chronic cholecystitis with calculus Active Problems:   Hypertension   ESRD on peritoneal dialysis ([place])   Type 2 diabetes mellitus ([place])   Discharged Condition: good  Hospital Course: Patient did well postoperatively.  She was followed by internal medicine and nephrology.  She was felt clear for discharge by nephrology on postoperative day 2 with stable laboratory studies.  She had good pain control, was tolerating her diet and was having no significant issues.  She was able to walk the halls without difficulty.  She already has follow-up scheduled tomorrow with her nephrologist.  She may begin low volume exchanges tonight which would put her 48 hours after surgery.  Recommend low volume exchanges over the next week if possible.  Anemia discussed with nephrology.  This was felt to be stable and could be followed as an outpatient since this was not a new finding  Consults: nephrology and Internal medicine  Significant Diagnostic Studies: labs:  CMP Latest Ref Rng & Units 03/11/2018 03/10/2018 03/10/2018  Glucose 70 - 99 mg/dL 81 125(H) 137(H)  BUN 6 - 20 mg/dL 59(H) 58(H) 51(H)  Creatinine 0.44 - 1.00 mg/dL 14.88(H) 14.44(H) 13.31(H)  Sodium 135 - 145 mmol/L 133(L) 132(L) 134(L)  Potassium 3.5 - 5.1 mmol/L 3.8 3.6 4.0  Chloride 98 - 111 mmol/L 92(L) 91(L) 92(L)  CO2 22 - 32 mmol/L 24 24 24   Calcium 8.9 - 10.3 mg/dL 8.3(L) 8.2(L) 8.4(L)  Total Protein 6.5 - 8.1 g/dL - 6.2(L) -  Total Bilirubin 0.3 - 1.2 mg/dL - 0.4 -  Alkaline Phos 38 - 126 U/L - 55 -  AST 15 - 41 U/L - 24 -  ALT 0 - 44 U/L - 8 -   CBC    Component Value Date/Time   WBC 5.5 03/11/2018 0544   RBC 2.94 (L) 03/11/2018 0544   HGB 7.0 (L) 03/11/2018 0544   HCT  23.1 (L) 03/11/2018 0544   PLT 144 (L) 03/11/2018 0544   MCV 78.6 (L) 03/11/2018 0544   MCH 23.8 (L) 03/11/2018 0544   MCHC 30.3 03/11/2018 0544   RDW 17.1 (H) 03/11/2018 0544   LYMPHSABS 1.3 02/23/2018 2027   MONOABS 1.1 (H) 02/23/2018 2027   EOSABS 0.3 02/23/2018 2027   BASOSABS 0.1 02/23/2018 2027    Treatments: surgery: Laparoscopic cholecystectomy  Discharge Exam: Blood pressure 128/70, pulse 68, temperature 97.7 F (36.5 C), temperature source Oral, resp. rate 18, height 5\' 8"  (1.727 m), weight 116.2 kg, last menstrual period 02/11/2018, SpO2 91 %. General appearance: alert and cooperative Cardio: regular rate and rhythm, S1, S2 normal, no murmur, click, rub or gallop Incision/Wound: Port sites clean dry and intact.  Soft nontender.  Peritoneal dialysis catheter intact  Disposition: Discharge disposition: 01-Home or Self Care       Discharge Instructions    Call MD for:  persistant nausea and vomiting   Complete by:  As directed    Call MD for:  redness, tenderness, or signs of infection (pain, swelling, redness, odor or green/yellow discharge around incision site)   Complete by:  As directed    Call MD for:  severe uncontrolled pain   Complete by:  As directed    Call MD for:  temperature >100.4   Complete by:  As directed    Diet -  low sodium heart healthy   Complete by:  As directed    Diet general   Complete by:  As directed    Driving Restrictions   Complete by:  As directed    Do not drive while taking pain medications   Increase activity slowly   Complete by:  As directed    Increase activity slowly   Complete by:  As directed    May shower / Bathe   Complete by:  As directed      Allergies as of 03/11/2018   No Known Allergies     Medication List    TAKE these medications   amLODipine 10 MG tablet Commonly known as:  NORVASC Take 1 tablet (10 mg total) by mouth daily.   aspirin EC 81 MG tablet Take 81 mg by mouth at bedtime.   calcitRIOL  0.5 MCG capsule Commonly known as:  ROCALTROL Take 0.5-1 mcg by mouth See admin instructions. 0.59mcg on Mondays through Fridays and take 29mcg on Saturdays and Sundays   doxazosin 4 MG tablet Commonly known as:  CARDURA Take 4 mg by mouth at bedtime.   HYDROcodone-acetaminophen 5-325 MG tablet Commonly known as:  NORCO Take 1 tablet by mouth every 4 (four) hours as needed for severe pain.   Insulin Glargine 300 UNIT/ML Sopn Commonly known as:  TOUJEO MAX SOLOSTAR Inject 14 Units into the skin at bedtime.   labetalol 200 MG tablet Commonly known as:  NORMODYNE Take 200 mg by mouth 2 (two) times daily.   metoprolol succinate 100 MG 24 hr tablet Commonly known as:  TOPROL-XL Take 1 tablet (100 mg total) by mouth daily. Take with or immediately following a meal.   ondansetron 4 MG disintegrating tablet Commonly known as:  ZOFRAN ODT Take 1 tablet (4 mg total) by mouth every 8 (eight) hours as needed for nausea or vomiting.   oxyCODONE 5 MG immediate release tablet Commonly known as:  Oxy IR/ROXICODONE Take 1 tablet (5 mg total) by mouth every 6 (six) hours as needed for severe pain.   polyethylene glycol-electrolytes 420 g solution Commonly known as:  TRILYTE Take 4,000 mLs by mouth as directed.   torsemide 100 MG tablet Commonly known as:  DEMADEX Take 1 tablet (100 mg total) by mouth at bedtime.      Follow-up Information    Donnie Mesa, MD. Schedule an appointment as soon as possible for a visit in 3 week(s).   Specialty:  General Surgery Contact information: [address] [place] [address] [phone]           Signed: Joyice Faster Lariya Kinzie 03/11/2018, 9:24 AM

## 2018-03-12 DIAGNOSIS — D509 Iron deficiency anemia, unspecified: Secondary | ICD-10-CM | POA: Diagnosis not present

## 2018-03-12 DIAGNOSIS — N2581 Secondary hyperparathyroidism of renal origin: Secondary | ICD-10-CM | POA: Diagnosis not present

## 2018-03-12 DIAGNOSIS — Z992 Dependence on renal dialysis: Secondary | ICD-10-CM | POA: Diagnosis not present

## 2018-03-12 DIAGNOSIS — D631 Anemia in chronic kidney disease: Secondary | ICD-10-CM | POA: Diagnosis not present

## 2018-03-12 DIAGNOSIS — N186 End stage renal disease: Secondary | ICD-10-CM | POA: Diagnosis not present

## 2018-03-12 LAB — TYPE AND SCREEN
ABO/RH(D): A POS
Antibody Screen: POSITIVE
DAT, IgG: POSITIVE
Donor AG Type: NEGATIVE
Donor AG Type: NEGATIVE
PT AG Type: NEGATIVE
UNIT DIVISION: 0
Unit division: 0

## 2018-03-12 LAB — BPAM RBC
Blood Product Expiration Date: 202001062359
Blood Product Expiration Date: 202001102359
ISSUE DATE / TIME: 201912141519
Unit Type and Rh: 6200
Unit Type and Rh: 6200

## 2018-03-13 DIAGNOSIS — D509 Iron deficiency anemia, unspecified: Secondary | ICD-10-CM | POA: Diagnosis not present

## 2018-03-13 DIAGNOSIS — N2581 Secondary hyperparathyroidism of renal origin: Secondary | ICD-10-CM | POA: Diagnosis not present

## 2018-03-13 DIAGNOSIS — N186 End stage renal disease: Secondary | ICD-10-CM | POA: Diagnosis not present

## 2018-03-13 DIAGNOSIS — Z992 Dependence on renal dialysis: Secondary | ICD-10-CM | POA: Diagnosis not present

## 2018-03-13 DIAGNOSIS — D631 Anemia in chronic kidney disease: Secondary | ICD-10-CM | POA: Diagnosis not present

## 2018-03-14 DIAGNOSIS — D631 Anemia in chronic kidney disease: Secondary | ICD-10-CM | POA: Diagnosis not present

## 2018-03-14 DIAGNOSIS — D509 Iron deficiency anemia, unspecified: Secondary | ICD-10-CM | POA: Diagnosis not present

## 2018-03-14 DIAGNOSIS — Z992 Dependence on renal dialysis: Secondary | ICD-10-CM | POA: Diagnosis not present

## 2018-03-14 DIAGNOSIS — N2581 Secondary hyperparathyroidism of renal origin: Secondary | ICD-10-CM | POA: Diagnosis not present

## 2018-03-14 DIAGNOSIS — N186 End stage renal disease: Secondary | ICD-10-CM | POA: Diagnosis not present

## 2018-03-15 DIAGNOSIS — Z992 Dependence on renal dialysis: Secondary | ICD-10-CM | POA: Diagnosis not present

## 2018-03-15 DIAGNOSIS — D631 Anemia in chronic kidney disease: Secondary | ICD-10-CM | POA: Diagnosis not present

## 2018-03-15 DIAGNOSIS — N2581 Secondary hyperparathyroidism of renal origin: Secondary | ICD-10-CM | POA: Diagnosis not present

## 2018-03-15 DIAGNOSIS — N186 End stage renal disease: Secondary | ICD-10-CM | POA: Diagnosis not present

## 2018-03-15 DIAGNOSIS — D509 Iron deficiency anemia, unspecified: Secondary | ICD-10-CM | POA: Diagnosis not present

## 2018-03-16 DIAGNOSIS — Z992 Dependence on renal dialysis: Secondary | ICD-10-CM | POA: Diagnosis not present

## 2018-03-16 DIAGNOSIS — N2581 Secondary hyperparathyroidism of renal origin: Secondary | ICD-10-CM | POA: Diagnosis not present

## 2018-03-16 DIAGNOSIS — D509 Iron deficiency anemia, unspecified: Secondary | ICD-10-CM | POA: Diagnosis not present

## 2018-03-16 DIAGNOSIS — N186 End stage renal disease: Secondary | ICD-10-CM | POA: Diagnosis not present

## 2018-03-16 DIAGNOSIS — D631 Anemia in chronic kidney disease: Secondary | ICD-10-CM | POA: Diagnosis not present

## 2018-03-17 DIAGNOSIS — Z992 Dependence on renal dialysis: Secondary | ICD-10-CM | POA: Diagnosis not present

## 2018-03-17 DIAGNOSIS — D509 Iron deficiency anemia, unspecified: Secondary | ICD-10-CM | POA: Diagnosis not present

## 2018-03-17 DIAGNOSIS — D631 Anemia in chronic kidney disease: Secondary | ICD-10-CM | POA: Diagnosis not present

## 2018-03-17 DIAGNOSIS — N2581 Secondary hyperparathyroidism of renal origin: Secondary | ICD-10-CM | POA: Diagnosis not present

## 2018-03-17 DIAGNOSIS — N186 End stage renal disease: Secondary | ICD-10-CM | POA: Diagnosis not present

## 2018-03-18 DIAGNOSIS — N2581 Secondary hyperparathyroidism of renal origin: Secondary | ICD-10-CM | POA: Diagnosis not present

## 2018-03-18 DIAGNOSIS — D631 Anemia in chronic kidney disease: Secondary | ICD-10-CM | POA: Diagnosis not present

## 2018-03-18 DIAGNOSIS — D509 Iron deficiency anemia, unspecified: Secondary | ICD-10-CM | POA: Diagnosis not present

## 2018-03-18 DIAGNOSIS — N186 End stage renal disease: Secondary | ICD-10-CM | POA: Diagnosis not present

## 2018-03-18 DIAGNOSIS — Z992 Dependence on renal dialysis: Secondary | ICD-10-CM | POA: Diagnosis not present

## 2018-03-19 DIAGNOSIS — D509 Iron deficiency anemia, unspecified: Secondary | ICD-10-CM | POA: Diagnosis not present

## 2018-03-19 DIAGNOSIS — N186 End stage renal disease: Secondary | ICD-10-CM | POA: Diagnosis not present

## 2018-03-19 DIAGNOSIS — D631 Anemia in chronic kidney disease: Secondary | ICD-10-CM | POA: Diagnosis not present

## 2018-03-19 DIAGNOSIS — Z992 Dependence on renal dialysis: Secondary | ICD-10-CM | POA: Diagnosis not present

## 2018-03-19 DIAGNOSIS — N2581 Secondary hyperparathyroidism of renal origin: Secondary | ICD-10-CM | POA: Diagnosis not present

## 2018-03-20 DIAGNOSIS — D509 Iron deficiency anemia, unspecified: Secondary | ICD-10-CM | POA: Diagnosis not present

## 2018-03-20 DIAGNOSIS — Z992 Dependence on renal dialysis: Secondary | ICD-10-CM | POA: Diagnosis not present

## 2018-03-20 DIAGNOSIS — D631 Anemia in chronic kidney disease: Secondary | ICD-10-CM | POA: Diagnosis not present

## 2018-03-20 DIAGNOSIS — N2581 Secondary hyperparathyroidism of renal origin: Secondary | ICD-10-CM | POA: Diagnosis not present

## 2018-03-20 DIAGNOSIS — N186 End stage renal disease: Secondary | ICD-10-CM | POA: Diagnosis not present

## 2018-03-21 DIAGNOSIS — N2581 Secondary hyperparathyroidism of renal origin: Secondary | ICD-10-CM | POA: Diagnosis not present

## 2018-03-21 DIAGNOSIS — Z992 Dependence on renal dialysis: Secondary | ICD-10-CM | POA: Diagnosis not present

## 2018-03-21 DIAGNOSIS — D509 Iron deficiency anemia, unspecified: Secondary | ICD-10-CM | POA: Diagnosis not present

## 2018-03-21 DIAGNOSIS — D631 Anemia in chronic kidney disease: Secondary | ICD-10-CM | POA: Diagnosis not present

## 2018-03-21 DIAGNOSIS — N186 End stage renal disease: Secondary | ICD-10-CM | POA: Diagnosis not present

## 2018-03-22 DIAGNOSIS — D631 Anemia in chronic kidney disease: Secondary | ICD-10-CM | POA: Diagnosis not present

## 2018-03-22 DIAGNOSIS — N186 End stage renal disease: Secondary | ICD-10-CM | POA: Diagnosis not present

## 2018-03-22 DIAGNOSIS — D509 Iron deficiency anemia, unspecified: Secondary | ICD-10-CM | POA: Diagnosis not present

## 2018-03-22 DIAGNOSIS — Z992 Dependence on renal dialysis: Secondary | ICD-10-CM | POA: Diagnosis not present

## 2018-03-22 DIAGNOSIS — N2581 Secondary hyperparathyroidism of renal origin: Secondary | ICD-10-CM | POA: Diagnosis not present

## 2018-03-23 DIAGNOSIS — N2581 Secondary hyperparathyroidism of renal origin: Secondary | ICD-10-CM | POA: Diagnosis not present

## 2018-03-23 DIAGNOSIS — D509 Iron deficiency anemia, unspecified: Secondary | ICD-10-CM | POA: Diagnosis not present

## 2018-03-23 DIAGNOSIS — N186 End stage renal disease: Secondary | ICD-10-CM | POA: Diagnosis not present

## 2018-03-23 DIAGNOSIS — D631 Anemia in chronic kidney disease: Secondary | ICD-10-CM | POA: Diagnosis not present

## 2018-03-23 DIAGNOSIS — Z992 Dependence on renal dialysis: Secondary | ICD-10-CM | POA: Diagnosis not present

## 2018-03-24 ENCOUNTER — Observation Stay (HOSPITAL_COMMUNITY)
Admission: EM | Admit: 2018-03-24 | Discharge: 2018-03-27 | Disposition: A | Discharge status: Home Health | Payer: Medicare Other | Attending: Internal Medicine | Admitting: Internal Medicine

## 2018-03-24 ENCOUNTER — Other Ambulatory Visit: Payer: Self-pay

## 2018-03-24 ENCOUNTER — Emergency Department (HOSPITAL_COMMUNITY): Payer: Medicare Other

## 2018-03-24 DIAGNOSIS — D631 Anemia in chronic kidney disease: Secondary | ICD-10-CM | POA: Insufficient documentation

## 2018-03-24 DIAGNOSIS — Z9889 Other specified postprocedural states: Secondary | ICD-10-CM | POA: Diagnosis not present

## 2018-03-24 DIAGNOSIS — I509 Heart failure, unspecified: Secondary | ICD-10-CM | POA: Diagnosis not present

## 2018-03-24 DIAGNOSIS — D259 Leiomyoma of uterus, unspecified: Secondary | ICD-10-CM | POA: Diagnosis not present

## 2018-03-24 DIAGNOSIS — Z992 Dependence on renal dialysis: Secondary | ICD-10-CM | POA: Diagnosis not present

## 2018-03-24 DIAGNOSIS — Z79899 Other long term (current) drug therapy: Secondary | ICD-10-CM | POA: Diagnosis not present

## 2018-03-24 DIAGNOSIS — Z9049 Acquired absence of other specified parts of digestive tract: Secondary | ICD-10-CM | POA: Insufficient documentation

## 2018-03-24 DIAGNOSIS — Z7982 Long term (current) use of aspirin: Secondary | ICD-10-CM | POA: Diagnosis not present

## 2018-03-24 DIAGNOSIS — E1129 Type 2 diabetes mellitus with other diabetic kidney complication: Secondary | ICD-10-CM | POA: Diagnosis present

## 2018-03-24 DIAGNOSIS — N186 End stage renal disease: Secondary | ICD-10-CM | POA: Insufficient documentation

## 2018-03-24 DIAGNOSIS — R0602 Shortness of breath: Secondary | ICD-10-CM | POA: Diagnosis not present

## 2018-03-24 DIAGNOSIS — M79604 Pain in right leg: Secondary | ICD-10-CM | POA: Diagnosis not present

## 2018-03-24 DIAGNOSIS — D509 Iron deficiency anemia, unspecified: Secondary | ICD-10-CM | POA: Diagnosis present

## 2018-03-24 DIAGNOSIS — I132 Hypertensive heart and chronic kidney disease with heart failure and with stage 5 chronic kidney disease, or end stage renal disease: Secondary | ICD-10-CM | POA: Insufficient documentation

## 2018-03-24 DIAGNOSIS — E1169 Type 2 diabetes mellitus with other specified complication: Secondary | ICD-10-CM | POA: Diagnosis present

## 2018-03-24 DIAGNOSIS — Z8249 Family history of ischemic heart disease and other diseases of the circulatory system: Secondary | ICD-10-CM | POA: Diagnosis not present

## 2018-03-24 DIAGNOSIS — E119 Type 2 diabetes mellitus without complications: Secondary | ICD-10-CM | POA: Diagnosis present

## 2018-03-24 DIAGNOSIS — E1122 Type 2 diabetes mellitus with diabetic chronic kidney disease: Secondary | ICD-10-CM | POA: Insufficient documentation

## 2018-03-24 DIAGNOSIS — I10 Essential (primary) hypertension: Secondary | ICD-10-CM | POA: Diagnosis present

## 2018-03-24 DIAGNOSIS — D649 Anemia, unspecified: Secondary | ICD-10-CM | POA: Diagnosis present

## 2018-03-24 DIAGNOSIS — Z794 Long term (current) use of insulin: Secondary | ICD-10-CM | POA: Diagnosis not present

## 2018-03-24 DIAGNOSIS — E8770 Fluid overload, unspecified: Secondary | ICD-10-CM | POA: Diagnosis not present

## 2018-03-24 DIAGNOSIS — R140 Abdominal distension (gaseous): Secondary | ICD-10-CM

## 2018-03-24 DIAGNOSIS — I120 Hypertensive chronic kidney disease with stage 5 chronic kidney disease or end stage renal disease: Secondary | ICD-10-CM | POA: Diagnosis not present

## 2018-03-24 DIAGNOSIS — R600 Localized edema: Secondary | ICD-10-CM | POA: Diagnosis not present

## 2018-03-24 LAB — CBC
HCT: 24.1 % — ABNORMAL LOW (ref 36.0–46.0)
Hemoglobin: 7.3 g/dL — ABNORMAL LOW (ref 12.0–15.0)
MCH: 24.2 pg — ABNORMAL LOW (ref 26.0–34.0)
MCHC: 30.3 g/dL (ref 30.0–36.0)
MCV: 79.8 fL — ABNORMAL LOW (ref 80.0–100.0)
Platelets: 173 K/uL (ref 150–400)
RBC: 3.02 MIL/uL — ABNORMAL LOW (ref 3.87–5.11)
RDW: 17.7 % — ABNORMAL HIGH (ref 11.5–15.5)
WBC: 8.9 K/uL (ref 4.0–10.5)
nRBC: 0 % (ref 0.0–0.2)

## 2018-03-24 LAB — HEPATIC FUNCTION PANEL
ALT: <5 U/L (ref 0–44)
AST: 12 U/L — ABNORMAL LOW (ref 15–41)
Albumin: 2.1 g/dL — ABNORMAL LOW (ref 3.5–5.0)
Alkaline Phosphatase: 87 U/L (ref 38–126)
Bilirubin, Direct: <0.1 mg/dL (ref 0.0–0.2)
Total Bilirubin: 0.4 mg/dL (ref 0.3–1.2)
Total Protein: 6.7 g/dL (ref 6.5–8.1)

## 2018-03-24 LAB — BASIC METABOLIC PANEL
Anion gap: 17 — ABNORMAL HIGH (ref 5–15)
BUN: 70 mg/dL — ABNORMAL HIGH (ref 6–20)
CO2: 24 mmol/L (ref 22–32)
Calcium: 7.8 mg/dL — ABNORMAL LOW (ref 8.9–10.3)
Chloride: 92 mmol/L — ABNORMAL LOW (ref 98–111)
Creatinine, Ser: 16.08 mg/dL — ABNORMAL HIGH (ref 0.44–1.00)
GFR calc Af Amer: 3 mL/min — ABNORMAL LOW (ref >60)
GFR calc non Af Amer: 2 mL/min — ABNORMAL LOW (ref >60)
Glucose, Bld: 140 mg/dL — ABNORMAL HIGH (ref 70–99)
Potassium: 4.2 mmol/L (ref 3.5–5.1)
Sodium: 133 mmol/L — ABNORMAL LOW (ref 135–145)

## 2018-03-24 LAB — LIPASE, BLOOD: Lipase: 22 U/L (ref 11–51)

## 2018-03-24 IMAGING — DX DG CHEST 2V
2 series · 2 of 2 positions shown · non-contrast
Comparison: [DATE]

CLINICAL DATA: Shortness of breath.

EXAM:
CHEST - 2 VIEW

[chest pa]
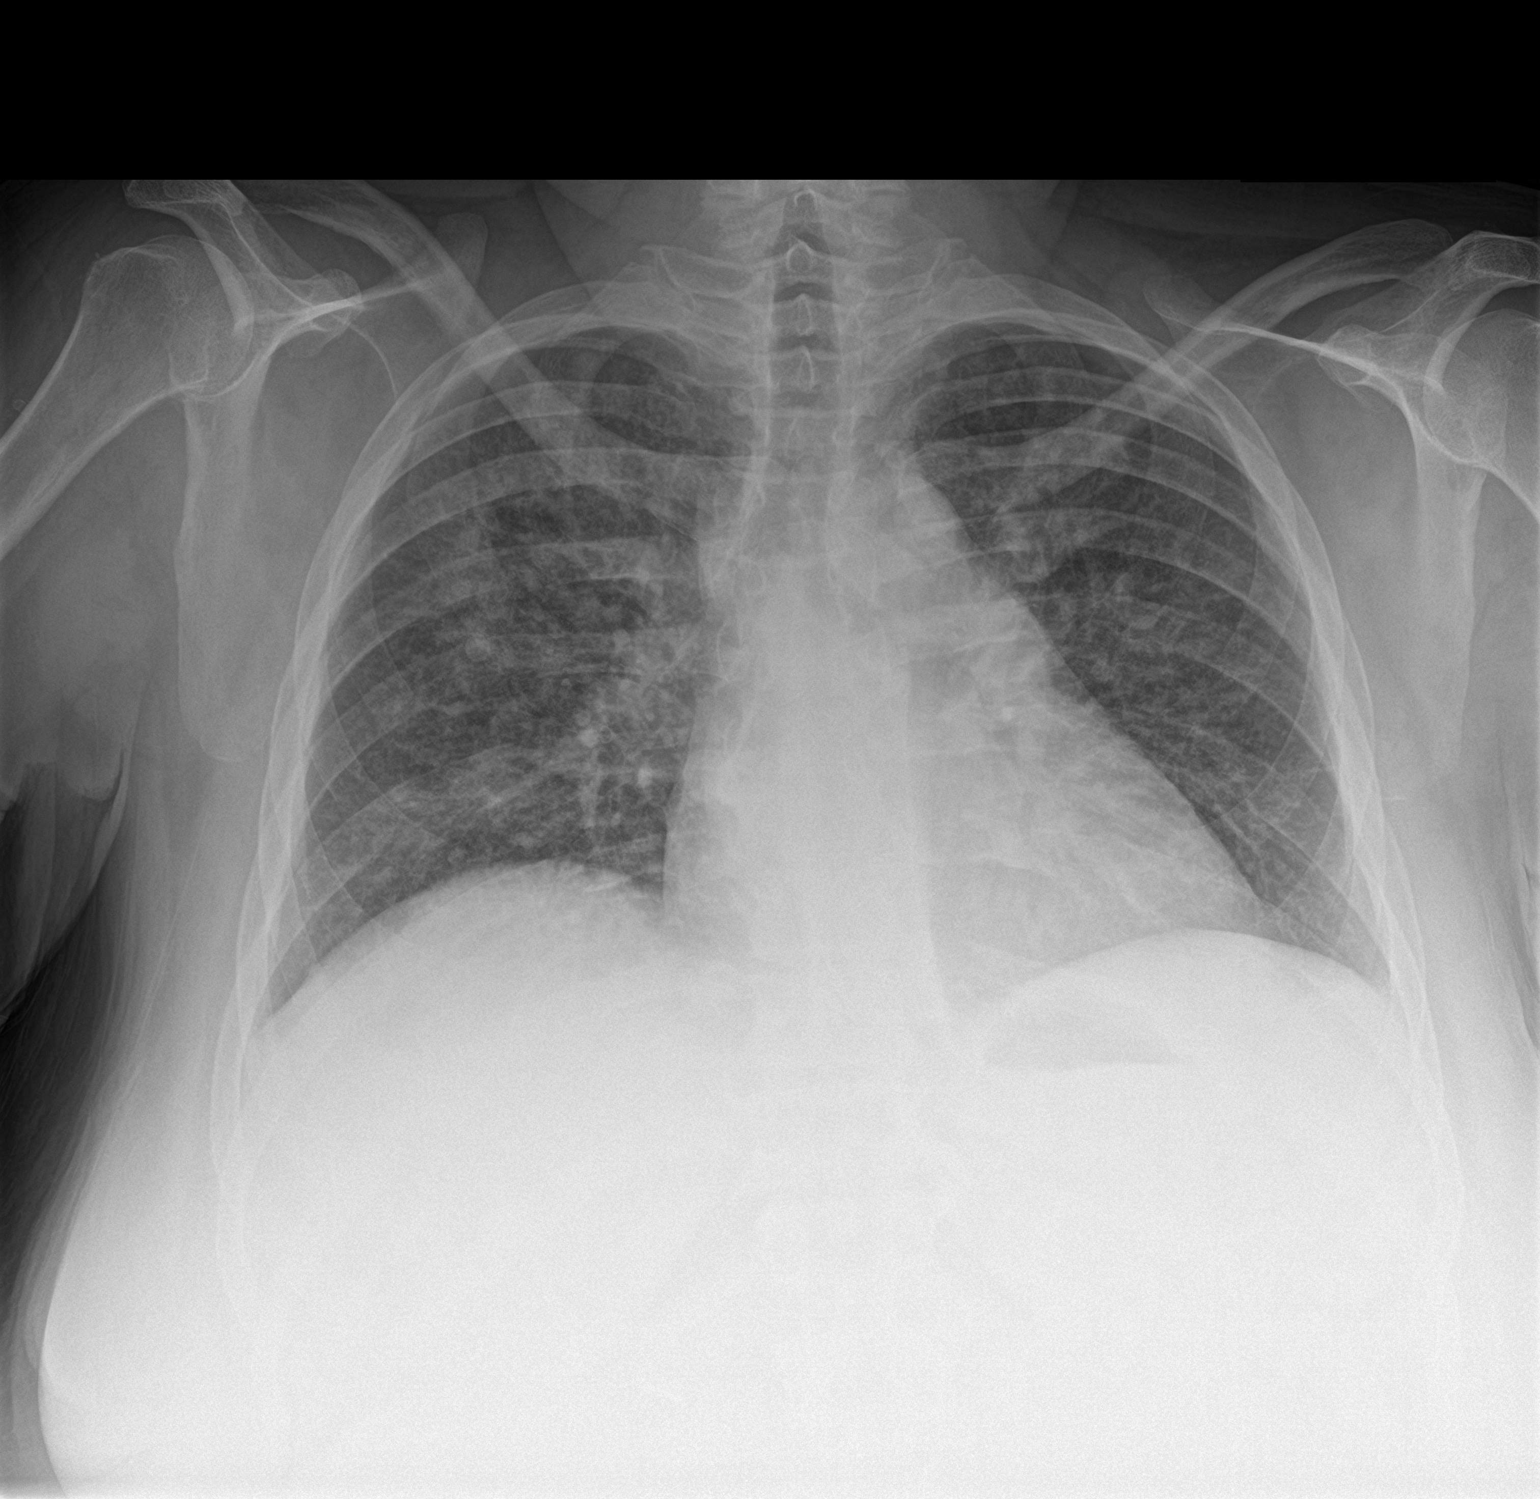

[chest lat]
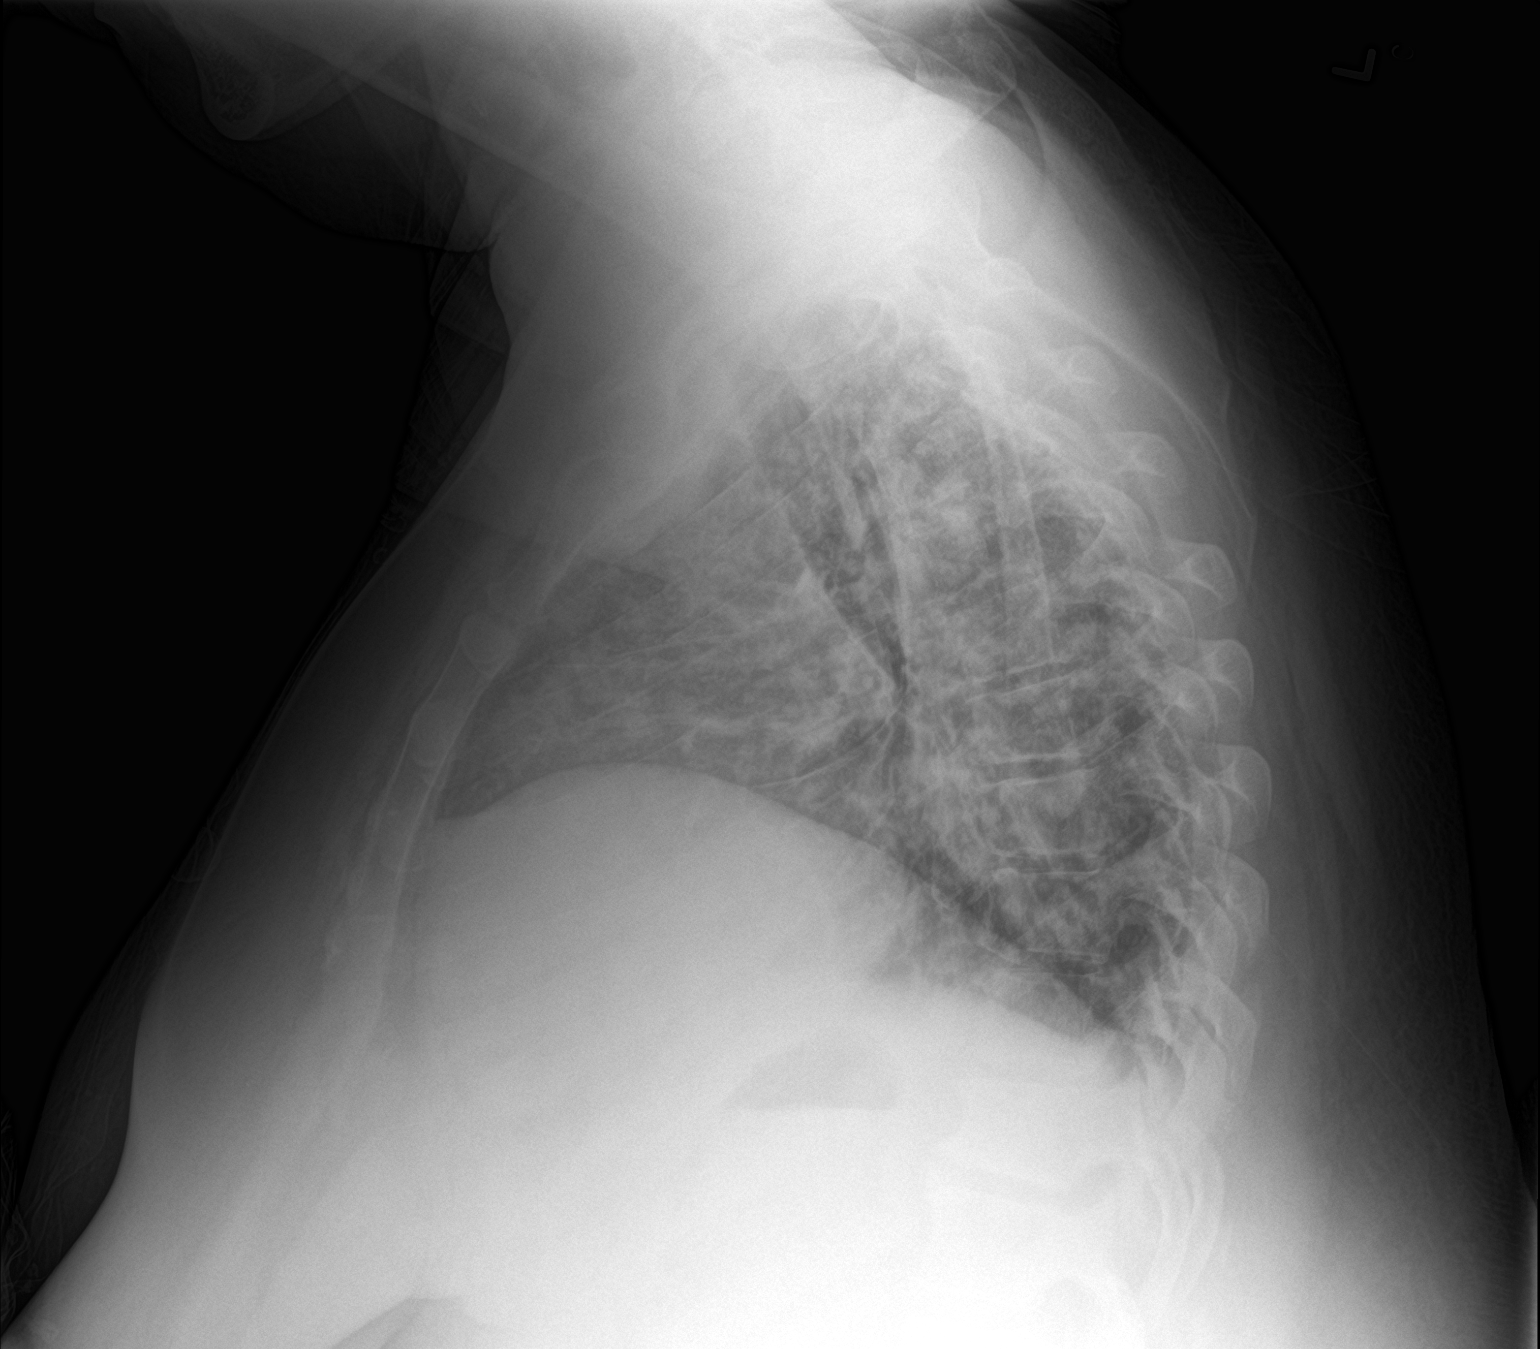

[2 of 2 positions shown; findings below may reference images not displayed]

FINDINGS: The heart size and mediastinal contours are within normal limits.
Increase pulmonary interstitium and hazy opacity are identified
throughout both lungs. Nodularities are identified in the right
lung. There is no pleural effusion. The visualized skeletal
structures are stable.
IMPRESSION: Increased pulmonary interstitium with hazy opacity in both lungs
with nodularities identified in the right lung. The findings are
nonspecific but can be due to infectious/inflammatory process.
Underlying pulmonary edema is possible.

## 2018-03-24 MED ORDER — TORSEMIDE 20 MG PO TABS: 100.0000 mg | ORAL_TABLET | Freq: Every day | ORAL | Status: DC

## 2018-03-24 MED ORDER — FUROSEMIDE 10 MG/ML IJ SOLN
80.0000 mg | Freq: Once | INTRAMUSCULAR | Status: AC
  Administered 2018-03-24: 80 mg via INTRAVENOUS
  Filled 2018-03-24: qty 8

## 2018-03-24 NOTE — ED Provider Notes (Signed)
[facility] EMERGENCY DEPARTMENT Provider Note   CSN: [id number] Arrival date & time: 03/24/18  1919     History   Chief Complaint Chief Complaint  Patient presents with  . Leg Pain    HPI Janet Mitchell is a 52 y.o. female.  Janet Mitchell is a 52 y.o. female with a history of ESRD on peritoneal dialysis, CHF, hypertension, diabetes, and chronic cholecystitis with recent cholecystectomy, who presents to the emergency department for persistent swelling in her abdomen and legs since her surgery.  Patient reports she started back on her usual peritoneal dialysis on December 18 but has had persistent and worsening swelling in her abdomen and legs that has continued to worsen despite continuing her usual dialysis, she reports it is now difficult for her to move her legs or walk.  Her abdomen feels very swollen but is not painful.  She has not had any fevers or chills.  She has some mild nausea and vomiting at baseline and this is unchanged and not worsened.  She was persistent cough and shortness of breath, she has been dealing with this cough since October and it seems to get better but never completely resolve, was initially on a course of antibiotics back in October.  But she reports the cough continues to persist and her shortness of breath is slightly worse than usual, worse with movement and when she tries to lay down flat.  No chest pain. She reports for the past 2 days when trying to do her home peritoneal dialysis with her pump her pump has had "no fill" she continue to manually dialyze, but has not had this issue with her dialysis pump previously.  She has not contacted her nephrologist regarding these issues, but reports she comes in today due to continued discomfort due to swelling and distention.  No other complications post cholecystectomy with Dr. Georgette Dover.      Past Medical History:  Diagnosis Date  . Anemia   . CHF (congestive heart failure) ([address])   .  Chronic cholecystitis with calculus   . ESRD (end stage renal disease) on dialysis [facility])    "peritoneal dialysis q hs" (03/09/2018)  . Headache    "a few/wk" (03/09/2018)  . History of blood transfusion 10/2017   "low blood count" (03/09/2018)  . Hypertension   . Spinal headache   . Type II diabetes mellitus [facility])     Patient Active Problem List   Diagnosis Date Noted  . Chronic cholecystitis with calculus 03/09/2018  . Type 2 diabetes mellitus ([place]) 03/09/2018  . Symptomatic anemia 11/06/2017  . Hypertension 11/06/2017  . ESRD on peritoneal dialysis ([place]) 11/06/2017    Past Surgical History:  Procedure Laterality Date  . AMPUTATION TOE Left 2013   Great toe  . CATARACT EXTRACTION W/ INTRAOCULAR LENS IMPLANT Right   . CESAREAN SECTION  1994; 1998  . CHOLECYSTECTOMY N/A 03/09/2018   Procedure: LAPAROSCOPIC CHOLECYSTECTOMY WITH INTRAOPERATIVE CHOLANGIOGRAM ERAS PATHWAY;  Surgeon: Donnie Mesa, MD;  Location: [place];  Service: General;  Laterality: N/A;  . FLEXIBLE SIGMOIDOSCOPY N/A 11/24/2017   Procedure: FLEXIBLE SIGMOIDOSCOPY;  Surgeon: Daneil Dolin, MD;  [facility];  Service: Endoscopy;  Laterality: N/A;  . JOINT REPLACEMENT    . LAPAROSCOPIC CHOLECYSTECTOMY  03/09/2018  . PERITONEAL CATHETER INSERTION  2017  . TOTAL HIP ARTHROPLASTY Left 1997     OB History   No obstetric history on file.      Home Medications    Prior  to Admission medications   Medication Sig Start Date End Date Taking? Authorizing Provider  amLODipine (NORVASC) 10 MG tablet Take 1 tablet (10 mg total) by mouth daily. 11/09/17   Roxan Hockey, MD  aspirin EC 81 MG tablet Take 81 mg by mouth at bedtime.     [provider]  calcitRIOL (ROCALTROL) 0.5 MCG capsule Take 0.5-1 mcg by mouth See admin instructions. 0.40mcg on Mondays through Fridays and take 24mcg on Saturdays and Sundays    [provider]  doxazosin (CARDURA) 4 MG tablet Take 4 mg by mouth at bedtime.     [provider]  HYDROcodone-acetaminophen (NORCO) 5-325 MG tablet Take 1 tablet by mouth every 4 (four) hours as needed for severe pain. 02/23/18   Sherwood Gambler, MD  Insulin Glargine (TOUJEO MAX SOLOSTAR) 300 UNIT/ML SOPN Inject 14 Units into the skin at bedtime. 11/09/17   Roxan Hockey, MD  labetalol (NORMODYNE) 200 MG tablet Take 200 mg by mouth 2 (two) times daily.    [provider]  metoprolol succinate (TOPROL-XL) 100 MG 24 hr tablet Take 1 tablet (100 mg total) by mouth daily. Take with or immediately following a meal. Patient not taking: Reported on 02/23/2018 11/09/17   Roxan Hockey, MD  ondansetron (ZOFRAN ODT) 4 MG disintegrating tablet Take 1 tablet (4 mg total) by mouth every 8 (eight) hours as needed for nausea or vomiting. 02/23/18   Sherwood Gambler, MD  oxyCODONE (OXY IR/ROXICODONE) 5 MG immediate release tablet Take 1 tablet (5 mg total) by mouth every 6 (six) hours as needed for severe pain. 03/09/18   Donnie Mesa, MD  polyethylene glycol-electrolytes (TRILYTE) 420 g solution Take 4,000 mLs by mouth as directed. Patient not taking: Reported on 02/23/2018 10/30/17   Carlis Stable, NP  torsemide (DEMADEX) 100 MG tablet Take 1 tablet (100 mg total) by mouth at bedtime. 11/09/17   Roxan Hockey, MD    Family History Family History  Problem Relation Age of Onset  . Heart disease Mother   . Thrombocytopenia Mother        TTP  . Heart failure Father   . Kidney disease Paternal Grandfather     Social History Social History   Tobacco Use  . Smoking status: Passive Smoke Exposure - Never Smoker  . Smokeless tobacco: Never Used  Substance Use Topics  . Alcohol use: Never    Frequency: Never  . Drug use: Never     Allergies   Patient has no known allergies.   Review of Systems Review of Systems  Constitutional: Negative for chills and fever.  HENT: Negative.   Eyes: Negative for visual disturbance.  Respiratory: Positive for cough and  shortness of breath. Negative for chest tightness and wheezing.   Cardiovascular: Positive for leg swelling. Negative for chest pain and palpitations.  Gastrointestinal: Positive for abdominal distention, nausea and vomiting. Negative for abdominal pain, constipation and diarrhea.  Genitourinary: Negative for dysuria and frequency.  Musculoskeletal: Positive for myalgias. Negative for arthralgias.  Skin: Negative for color change and rash.  Neurological: Negative for dizziness, syncope and light-headedness.     Physical Exam Updated Vital Signs BP (!) 151/75   Pulse 81   Temp 98.7 F (37.1 C) (Oral)   Resp 18   Ht 5\' 8"  (1.727 m)   Wt 118.8 kg   LMP 03/11/2018 (Approximate)   SpO2 97%   BMI 39.84 kg/m   Physical Exam Vitals signs and nursing note reviewed.  Constitutional:  General: She is not in acute distress.    Appearance: Normal appearance. She is well-developed. She is obese. She is not ill-appearing or diaphoretic.  HENT:     Head: Normocephalic and atraumatic.     Mouth/Throat:     Mouth: Mucous membranes are moist.     Pharynx: Oropharynx is clear.  Eyes:     General:        Right eye: No discharge.        Left eye: No discharge.  Neck:     Musculoskeletal: Neck supple.  Cardiovascular:     Rate and Rhythm: Normal rate and regular rhythm.     Pulses: Normal pulses.     Heart sounds: Normal heart sounds. No murmur. No friction rub. No gallop.   Pulmonary:     Effort: Pulmonary effort is normal. No respiratory distress.     Breath sounds: Rales present.     Comments: Respirations equal and unlabored, patient able to speak in full sentences, lungs with some faint crackles in bilateral bases, occasional cough during exam Abdominal:     General: Bowel sounds are normal. There is distension.     Palpations: Abdomen is soft. There is no mass.     Tenderness: There is no abdominal tenderness. There is no guarding or rebound.     Hernia: No hernia is present.       Comments: Abdomen is distended but soft, bowel sounds present throughout, there is no focal tenderness, no rigidity or guarding.  Peritoneal dialysis catheter in place with no surrounding erythema, recent trocar scar noted and appear to be well healing with no surrounding erythema  Musculoskeletal:     Comments: Bilateral lower extremities with significant edema, non-tender, no erythema or warmth, 2+ DP and PT pulses  Neurological:     Mental Status: She is alert.     Coordination: Coordination normal.  Psychiatric:        Mood and Affect: Mood normal.        Behavior: Behavior normal.      ED Treatments / Results  Labs (all labs ordered are listed, but only abnormal results are displayed) Labs Reviewed  BASIC METABOLIC PANEL - Abnormal; Notable for the following components:      Result Value   Sodium 133 (*)    Chloride 92 (*)    Glucose, Bld 140 (*)    BUN 70 (*)    Creatinine, Ser 16.08 (*)    Calcium 7.8 (*)    GFR calc non Af Amer 2 (*)    GFR calc Af Amer 3 (*)    Anion gap 17 (*)    All other components within normal limits  CBC - Abnormal; Notable for the following components:   RBC 3.02 (*)    Hemoglobin 7.3 (*)    HCT 24.1 (*)    MCV 79.8 (*)    MCH 24.2 (*)    RDW 17.7 (*)    All other components within normal limits  HEPATIC FUNCTION PANEL - Abnormal; Notable for the following components:   Albumin 2.1 (*)    AST 12 (*)    All other components within normal limits  LIPASE, BLOOD    EKG None  Radiology Dg Chest 2 View  Result Date: 03/24/2018 CLINICAL DATA:  Shortness of breath. EXAM: CHEST - 2 VIEW COMPARISON:  February 23, 2018 FINDINGS: The heart size and mediastinal contours are within normal limits. Increase pulmonary interstitium and hazy opacity are identified throughout both  lungs. Nodularities are identified in the right lung. There is no pleural effusion. The visualized skeletal structures are stable. IMPRESSION: Increased pulmonary  interstitium with hazy opacity in both lungs with nodularities identified in the right lung. The findings are nonspecific but can be due to infectious/inflammatory process. Underlying pulmonary edema is possible. Electronically Signed   By: Abelardo Diesel M.D.   On: 03/24/2018 20:37    Procedures Procedures (including critical care time)  Medications Ordered in ED Medications  furosemide (LASIX) injection 80 mg (has no administration in time range)     Initial Impression / Assessment and Plan / ED Course  I have reviewed the triage vital signs and the nursing notes.  Pertinent labs & imaging results that were available during my care of the patient were reviewed by me and considered in my medical decision making (see chart for details).  Patient presents with worsening abdominal swelling and leg swelling since her recent cholecystectomy on 12/13.  Patient is a dialysis patient and does home peritoneal dialysis, he started dialysis on 1218 after surgery but reports persistent and worsening abdominal and lower extremity swelling despite this.  Had issues with her dialysis pump the past 2 days, reporting that it said "no fill".  No other complications post surgery.  No fevers or abdominal pain.  Patient has mild nausea and vomiting at baseline even prior to surgery and this is not changed or worsened.  Reports some shortness of breath and persistent cough since October, no chest pain.  On arrival mildly hypertensive but all other vitals normal.  Some mild crackles in bilateral lung bases but patient is breathing without any distress or increased work of breathing.  Abdomen is distended but soft and nontender to palpation, no erythema surrounding peritoneal dialysis catheter had bilateral nontender or erythematous lower extremity edema noted.  Initial labs obtained from triage, show creatinine is slightly bumped from baseline, 16, usually around 14.  Potassium normal.  Stable hemoglobin, no  leukocytosis.  Chest x-ray shows some opacity, question this being infectious versus inflammatory, could also be mild pulmonary edema.  Add on LFTs and given recent surgery will check CT abdomen pelvis to ensure no concerning postsurgical complication or kinking of dialysis catheter.  Will also touch base with nephrology, do not think the patient necessarily needs to come in for emergent dialysis but that she likely needs resources  11:00 PM discussed with Dr. Royce Macadamia with nephrology who reviewed patient's labs and studies thus far, agrees with plan for CT abdomen pelvis to check placement of dialysis catheter, recommends giving 80 of IV Lasix, and given that patient has had difficulties with her dialysis and troubleshooting at home without success would like patient to be admitted under hospitalist service and they will see patient in consult to help continue with troubleshooting her dialysis here in the hospital and relieving fluid overload.  LFTs and lipase unremarkable.  CT abdomen pelvis shows no acute finding, discussed with radiologist Dr. Augustin Coupe, who feels that dialysis catheter is in appropriate place and does not appear to be kinked.  Per nephrology's recommendations, will proceed with hospitalist admission and they will consult to continue to help troubleshoot dialysis and treat fluid overload.  Case discussed with Dr. Blaine Hamper who will see and admit the patient.   Final Clinical Impressions(s) / ED Diagnoses   Final diagnoses:  Bilateral leg edema  Abdominal distention  ESRD on peritoneal dialysis [facility])    ED Discharge Orders    None  Jacqlyn Larsen, PA-C 03/25/18 0119    Drenda Freeze, MD 03/27/18 [phone]

## 2018-03-24 NOTE — ED Triage Notes (Signed)
Patient states that she had gallbladder surgery on December 13th and has been swelling in her abdomen and legs since that time. Patient is a home dialysis patient. Also states that she has had a cough since October and when she coughs has her baseline SOB.

## 2018-03-25 ENCOUNTER — Observation Stay (HOSPITAL_BASED_OUTPATIENT_CLINIC_OR_DEPARTMENT_OTHER): Payer: Medicare Other

## 2018-03-25 ENCOUNTER — Emergency Department (HOSPITAL_COMMUNITY): Payer: Medicare Other

## 2018-03-25 DIAGNOSIS — Z992 Dependence on renal dialysis: Secondary | ICD-10-CM | POA: Diagnosis not present

## 2018-03-25 DIAGNOSIS — D259 Leiomyoma of uterus, unspecified: Secondary | ICD-10-CM | POA: Diagnosis not present

## 2018-03-25 DIAGNOSIS — M79609 Pain in unspecified limb: Secondary | ICD-10-CM | POA: Diagnosis not present

## 2018-03-25 DIAGNOSIS — Z794 Long term (current) use of insulin: Secondary | ICD-10-CM

## 2018-03-25 DIAGNOSIS — D631 Anemia in chronic kidney disease: Secondary | ICD-10-CM | POA: Diagnosis not present

## 2018-03-25 DIAGNOSIS — D649 Anemia, unspecified: Secondary | ICD-10-CM | POA: Diagnosis present

## 2018-03-25 DIAGNOSIS — D509 Iron deficiency anemia, unspecified: Secondary | ICD-10-CM | POA: Diagnosis present

## 2018-03-25 DIAGNOSIS — N186 End stage renal disease: Secondary | ICD-10-CM

## 2018-03-25 DIAGNOSIS — I10 Essential (primary) hypertension: Secondary | ICD-10-CM

## 2018-03-25 DIAGNOSIS — R600 Localized edema: Secondary | ICD-10-CM

## 2018-03-25 DIAGNOSIS — E1122 Type 2 diabetes mellitus with diabetic chronic kidney disease: Secondary | ICD-10-CM

## 2018-03-25 DIAGNOSIS — N185 Chronic kidney disease, stage 5: Secondary | ICD-10-CM

## 2018-03-25 DIAGNOSIS — E8770 Fluid overload, unspecified: Secondary | ICD-10-CM | POA: Diagnosis present

## 2018-03-25 DIAGNOSIS — I120 Hypertensive chronic kidney disease with stage 5 chronic kidney disease or end stage renal disease: Secondary | ICD-10-CM | POA: Diagnosis not present

## 2018-03-25 LAB — CBG MONITORING, ED: Glucose-Capillary: 126 mg/dL — ABNORMAL HIGH (ref 70–99)

## 2018-03-25 LAB — BASIC METABOLIC PANEL
Anion gap: 17 — ABNORMAL HIGH (ref 5–15)
BUN: 72 mg/dL — ABNORMAL HIGH (ref 6–20)
CALCIUM: 7.8 mg/dL — AB (ref 8.9–10.3)
CO2: 24 mmol/L (ref 22–32)
Chloride: 93 mmol/L — ABNORMAL LOW (ref 98–111)
Creatinine, Ser: 16.28 mg/dL — ABNORMAL HIGH (ref 0.44–1.00)
GFR calc Af Amer: 3 mL/min — ABNORMAL LOW (ref >60)
GFR calc non Af Amer: 2 mL/min — ABNORMAL LOW (ref >60)
GLUCOSE: 107 mg/dL — AB (ref 70–99)
Potassium: 4.4 mmol/L (ref 3.5–5.1)
Sodium: 134 mmol/L — ABNORMAL LOW (ref 135–145)

## 2018-03-25 LAB — HEMOGLOBIN A1C
HEMOGLOBIN A1C: 6.4 % — AB (ref 4.8–5.6)
Mean Plasma Glucose: 136.98 mg/dL

## 2018-03-25 LAB — CBC
HCT: 24 % — ABNORMAL LOW (ref 36.0–46.0)
Hemoglobin: 7.2 g/dL — ABNORMAL LOW (ref 12.0–15.0)
MCH: 23.6 pg — ABNORMAL LOW (ref 26.0–34.0)
MCHC: 30 g/dL (ref 30.0–36.0)
MCV: 78.7 fL — ABNORMAL LOW (ref 80.0–100.0)
Platelets: 177 10*3/uL (ref 150–400)
RBC: 3.05 MIL/uL — ABNORMAL LOW (ref 3.87–5.11)
RDW: 17.7 % — AB (ref 11.5–15.5)
WBC: 8 10*3/uL (ref 4.0–10.5)
nRBC: 0 % (ref 0.0–0.2)

## 2018-03-25 LAB — GLUCOSE, CAPILLARY
Glucose-Capillary: 70 mg/dL (ref 70–99)
Glucose-Capillary: 130 mg/dL — ABNORMAL HIGH (ref 70–99)

## 2018-03-25 IMAGING — CT CT ABD-PELV W/O CM
2 of 4 series · 16 of 46 positions shown, 18 images · non-contrast
Comparison: [DATE]

Addendum:
CLINICAL DATA: Patient had gallbladder surgery on [DATE]. Patient complains of abdominal swelling since surgery. Patient
is on dialysis.

EXAM:
CT ABDOMEN AND PELVIS WITHOUT CONTRAST
TECHNIQUE: Multidetector CT imaging of the abdomen and pelvis was performed
following the standard protocol without IV contrast.

[Series 3: a/p w/o 5mm · axial · non-contrast · 0.98mm/px · z∈[-658,-178]mm · 13 of 106 slices shown, 15 images]
[im 5/106  soft-tissue]
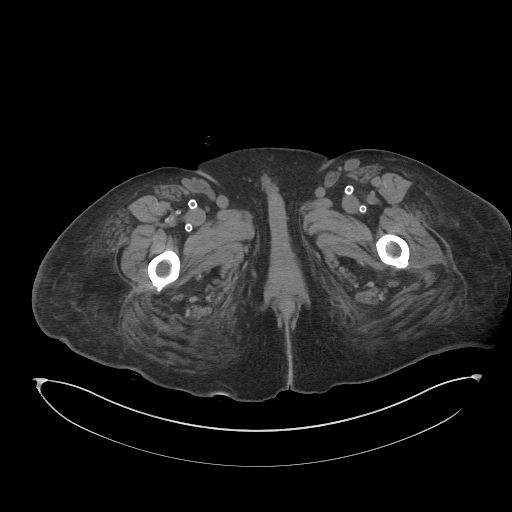
[im 5/106  bone]
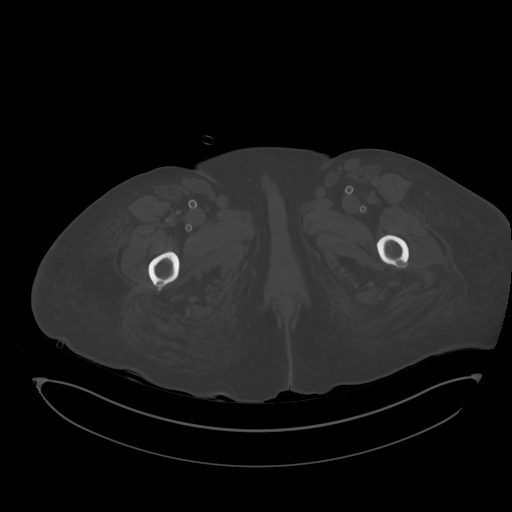
[im 15/106  soft-tissue]
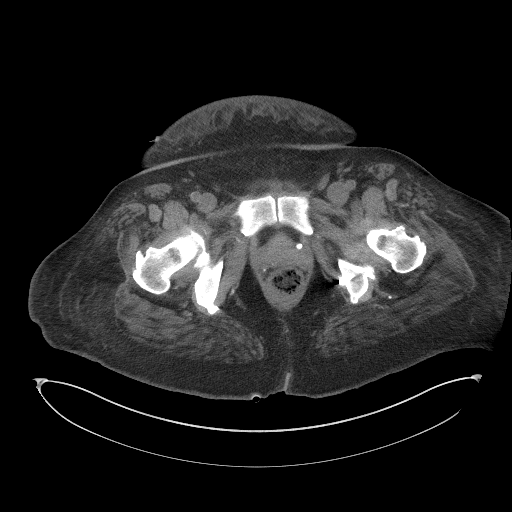
[im 24/106  soft-tissue]
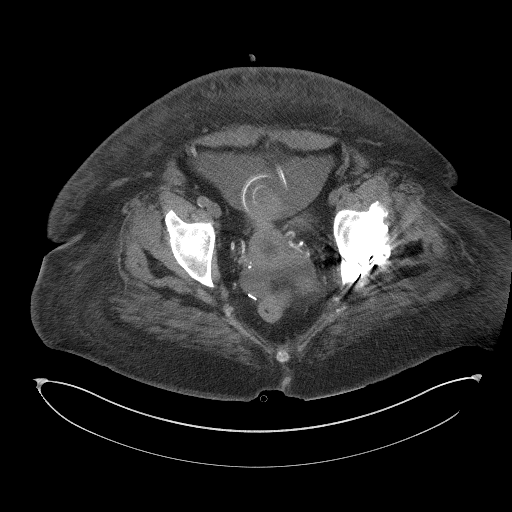
[im 29/106  soft-tissue]
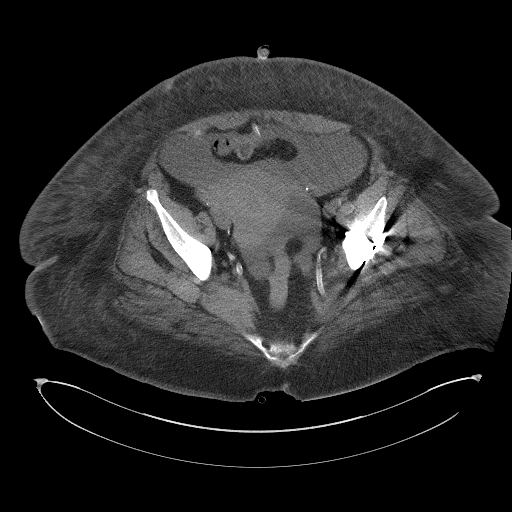
[im 39/106  soft-tissue]
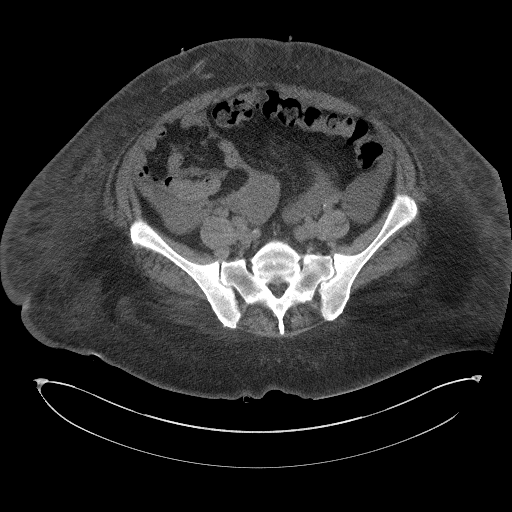
[im 43/106  soft-tissue]
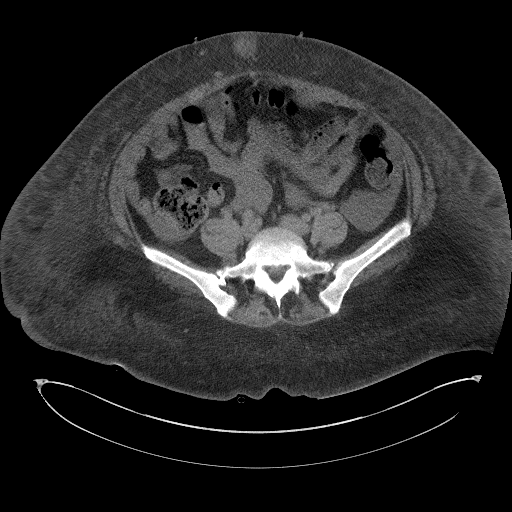
[im 53/106  soft-tissue]
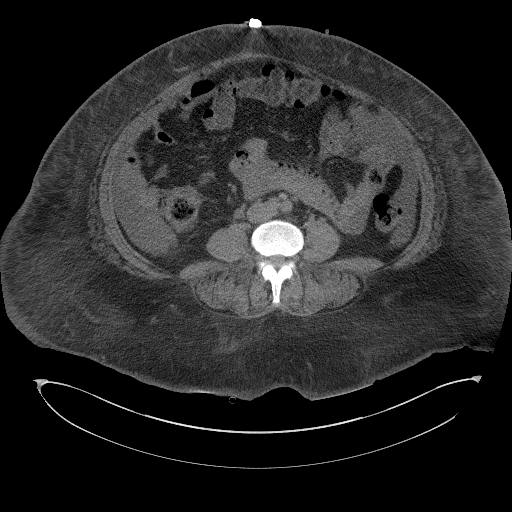
[im 63/106  soft-tissue]
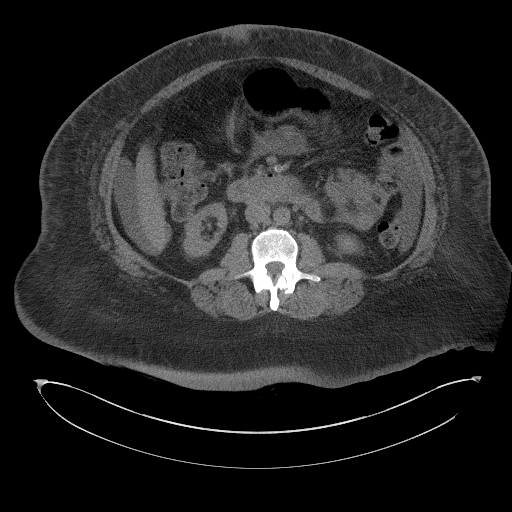
[im 67/106  soft-tissue]
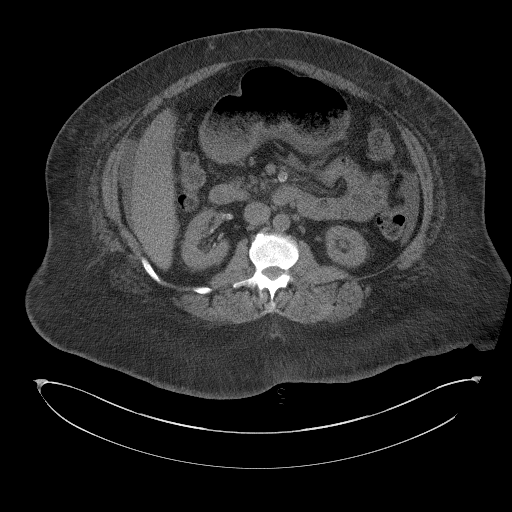
[im 67/106  bone]
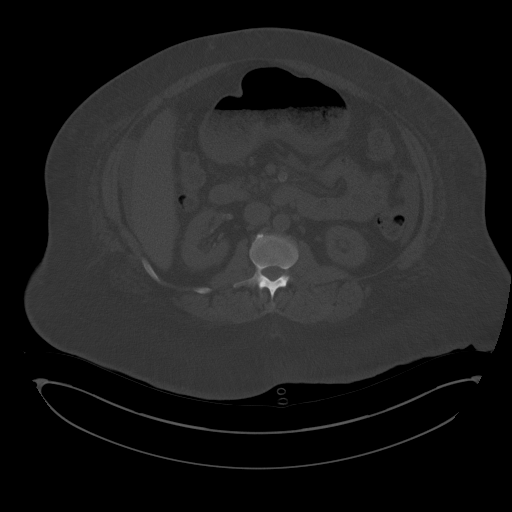
[im 77/106  soft-tissue]
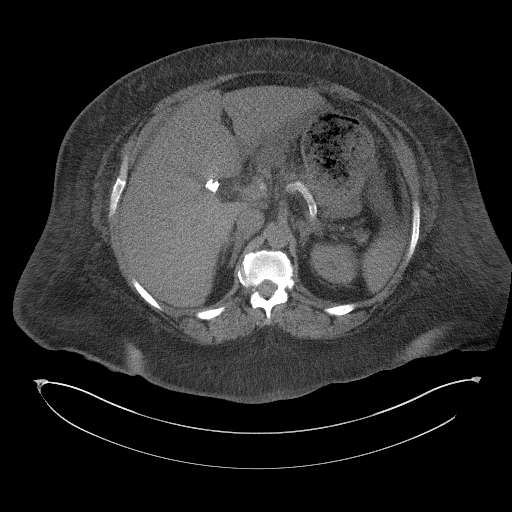
[im 82/106  soft-tissue]
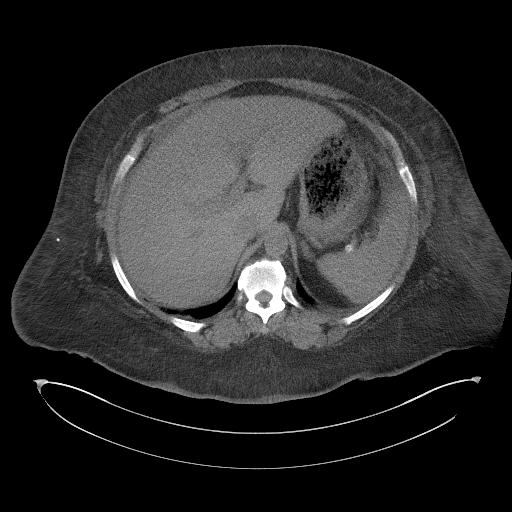
[im 91/106  soft-tissue]
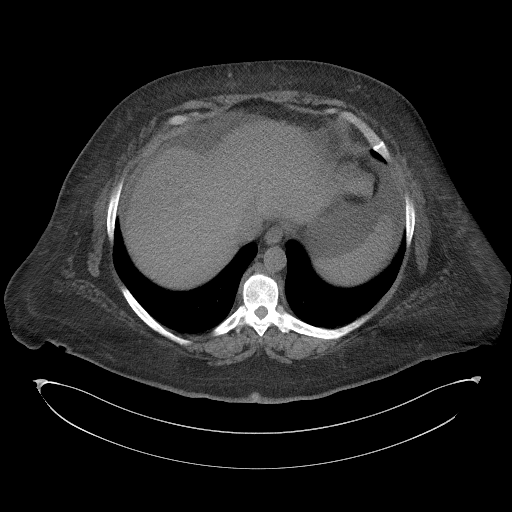
[im 101/106  soft-tissue]
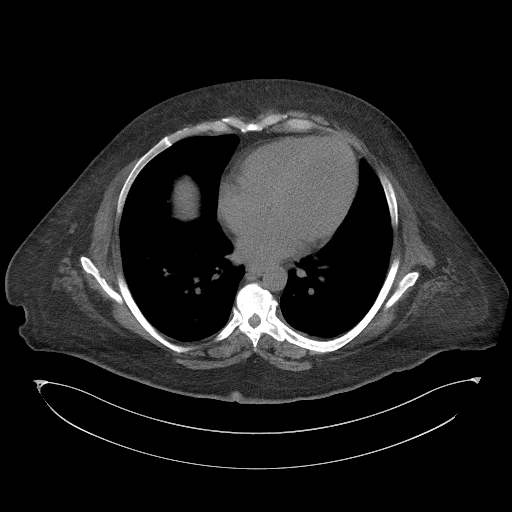

[Series 6: a/p w/o cor · coronal · non-contrast · 1.03mm/px · 3 of 213 slices shown]
[im 71/213  soft-tissue]
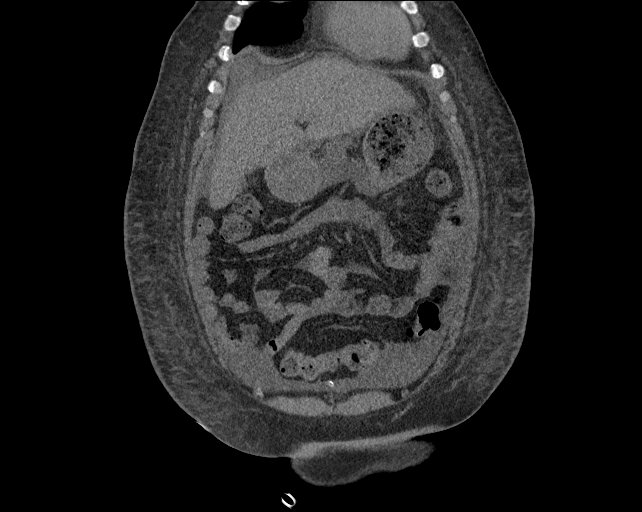
[im 95/213  soft-tissue]
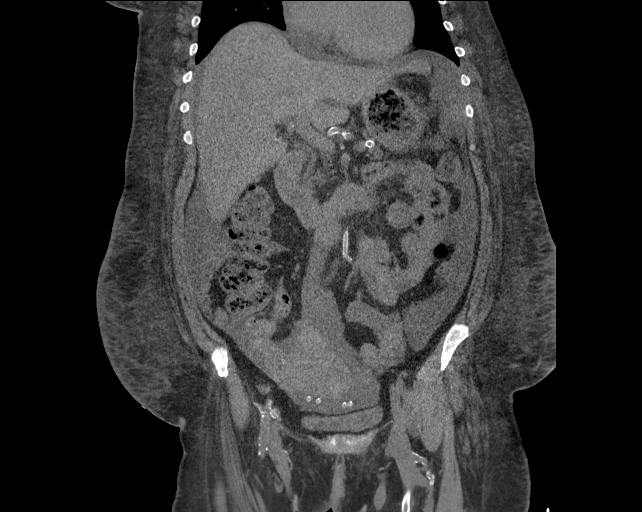
[im 118/213  soft-tissue]
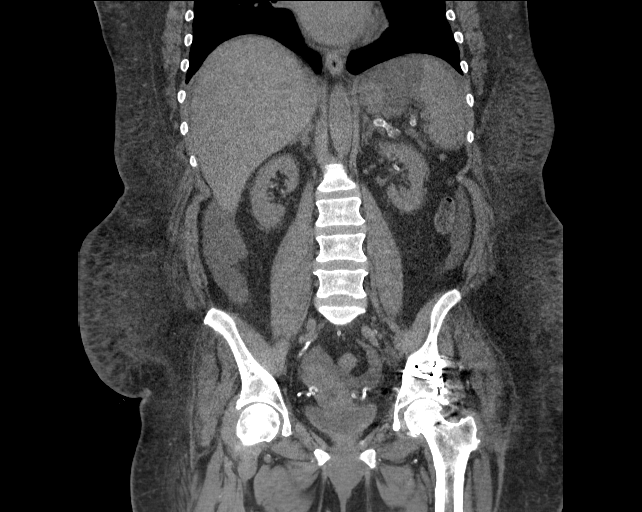

[16 of 46 positions shown; findings below may reference images not displayed]

FINDINGS: Lower chest: Ground-glass and nodular densities are identified
throughout the visualized bilateral lung bases. There is no pleural
effusion. The heart size is upper limits of normal.

Hepatobiliary: Noncontrast liver is normal. Patient status post
prior cholecystectomy. The biliary tree is normal.

Pancreas: Unremarkable. No pancreatic ductal dilatation or
surrounding inflammatory changes.

Spleen: Normal in size without focal abnormality.

Adrenals/Urinary Tract: The adrenal glands are normal. The bilateral
kidneys are atrophic. There is no hydronephrosis bilaterally.
Fluid-filled bladder is normal.

Stomach/Bowel: Stomach is within normal limits. Appendix appears
normal. No evidence of bowel wall thickening, distention, or
inflammatory changes.

Vascular/Lymphatic: The aorta is normal in caliber. Atherosclerosis
of branching vessels of the aorta are noted. There is no abdominal
or pelvic lymphadenopathy.

Reproductive: There probable uterine fibroids unchanged compared
prior exam. Bilateral adnexa are normal.

Other: Suprapubic catheter is identified. There is moderate ascites
in the abdomen and pelvis.

Musculoskeletal: Degenerative joint changes of the spine are noted.
IMPRESSION: Status post prior cholecystectomy without evidence of complication
in the gallbladder fossa.

Moderate ascites.

Ground-glass and nodular densities noted throughout bilateral lung
bases. This is nonspecific but can be seen in
infectious/inflammatory etiology.

ADDENDUM:
In the findings of the report should say a dialysis catheter
(instead of supra pubic catheter) is identified in the pelvis.

*** End of Addendum ***

## 2018-03-25 MED ORDER — CALCITRIOL 0.5 MCG PO CAPS
0.5000 ug | ORAL_CAPSULE | ORAL | Status: DC
  Administered 2018-03-26 – 2018-03-27 (×2): 0.5 ug via ORAL
  Filled 2018-03-25 (×2): qty 1

## 2018-03-25 MED ORDER — HEPARIN SODIUM (PORCINE) 5000 UNIT/ML IJ SOLN
5000.0000 [IU] | Freq: Three times a day (TID) | INTRAMUSCULAR | Status: DC
  Administered 2018-03-25 – 2018-03-26 (×3): 5000 [IU] via SUBCUTANEOUS
  Filled 2018-03-25 (×2): qty 1

## 2018-03-25 MED ORDER — DOXAZOSIN MESYLATE 2 MG PO TABS
4.0000 mg | ORAL_TABLET | Freq: Every day | ORAL | Status: DC
  Administered 2018-03-25 – 2018-03-26 (×2): 4 mg via ORAL
  Filled 2018-03-25: qty 1
  Filled 2018-03-25 (×2): qty 2

## 2018-03-25 MED ORDER — ZOLPIDEM TARTRATE 5 MG PO TABS
5.0000 mg | ORAL_TABLET | Freq: Every evening | ORAL | Status: DC | PRN
  Filled 2018-03-25 (×2): qty 1

## 2018-03-25 MED ORDER — TORSEMIDE 100 MG PO TABS
100.0000 mg | ORAL_TABLET | Freq: Every day | ORAL | Status: DC
  Administered 2018-03-25 – 2018-03-26 (×2): 100 mg via ORAL
  Filled 2018-03-25 (×3): qty 1

## 2018-03-25 MED ORDER — GENTAMICIN SULFATE 0.1 % EX CREA
1.0000 "application " | TOPICAL_CREAM | Freq: Every day | CUTANEOUS | Status: DC
  Administered 2018-03-25 – 2018-03-27 (×2): 1 via TOPICAL
  Filled 2018-03-25: qty 15

## 2018-03-25 MED ORDER — ONDANSETRON HCL 4 MG PO TABS: 4.0000 mg | ORAL_TABLET | Freq: Four times a day (QID) | ORAL | Status: DC | PRN

## 2018-03-25 MED ORDER — INSULIN ASPART 100 UNIT/ML ~~LOC~~ SOLN: 0.0000 [IU] | Freq: Three times a day (TID) | SUBCUTANEOUS | Status: DC

## 2018-03-25 MED ORDER — HEPARIN 1000 UNIT/ML FOR PERITONEAL DIALYSIS: INTRAPERITONEAL | Status: DC | PRN

## 2018-03-25 MED ORDER — CALCITRIOL 0.5 MCG PO CAPS: 0.5000 ug | ORAL_CAPSULE | ORAL | Status: DC

## 2018-03-25 MED ORDER — ALBUTEROL SULFATE (2.5 MG/3ML) 0.083% IN NEBU: 2.5000 mg | INHALATION_SOLUTION | RESPIRATORY_TRACT | Status: DC | PRN

## 2018-03-25 MED ORDER — ACETAMINOPHEN 325 MG PO TABS
650.0000 mg | ORAL_TABLET | Freq: Four times a day (QID) | ORAL | Status: DC | PRN
  Administered 2018-03-26: 650 mg via ORAL
  Filled 2018-03-25: qty 2

## 2018-03-25 MED ORDER — LABETALOL HCL 200 MG PO TABS
200.0000 mg | ORAL_TABLET | Freq: Two times a day (BID) | ORAL | Status: DC
  Administered 2018-03-25 – 2018-03-27 (×5): 200 mg via ORAL
  Filled 2018-03-25 (×5): qty 1

## 2018-03-25 MED ORDER — OXYCODONE HCL 5 MG PO TABS
5.0000 mg | ORAL_TABLET | Freq: Four times a day (QID) | ORAL | Status: DC | PRN
  Filled 2018-03-25: qty 1

## 2018-03-25 MED ORDER — HYDRALAZINE HCL 20 MG/ML IJ SOLN: 5.0000 mg | INTRAMUSCULAR | Status: DC | PRN

## 2018-03-25 MED ORDER — ASPIRIN EC 81 MG PO TBEC
81.0000 mg | DELAYED_RELEASE_TABLET | Freq: Every day | ORAL | Status: DC
  Administered 2018-03-25 – 2018-03-27 (×3): 81 mg via ORAL
  Filled 2018-03-25 (×3): qty 1

## 2018-03-25 MED ORDER — AMLODIPINE BESYLATE 10 MG PO TABS
10.0000 mg | ORAL_TABLET | Freq: Every day | ORAL | Status: DC
  Administered 2018-03-25 – 2018-03-27 (×3): 10 mg via ORAL
  Filled 2018-03-25: qty 2
  Filled 2018-03-25 (×2): qty 1

## 2018-03-25 MED ORDER — SENNOSIDES-DOCUSATE SODIUM 8.6-50 MG PO TABS
1.0000 | ORAL_TABLET | Freq: Every evening | ORAL | Status: DC | PRN
  Administered 2018-03-26: 1 via ORAL
  Filled 2018-03-25: qty 1

## 2018-03-25 MED ORDER — DM-GUAIFENESIN ER 30-600 MG PO TB12
1.0000 | ORAL_TABLET | Freq: Two times a day (BID) | ORAL | Status: DC | PRN
  Filled 2018-03-25: qty 1

## 2018-03-25 MED ORDER — ONDANSETRON HCL 4 MG/2ML IJ SOLN: 4.0000 mg | Freq: Four times a day (QID) | INTRAMUSCULAR | Status: DC | PRN

## 2018-03-25 MED ORDER — HEPARIN 1000 UNIT/ML FOR PERITONEAL DIALYSIS
INTRAPERITONEAL | Status: DC | PRN
  Filled 2018-03-25: qty 5000

## 2018-03-25 MED ORDER — INSULIN GLARGINE 100 UNIT/ML ~~LOC~~ SOLN
10.0000 [IU] | Freq: Every day | SUBCUTANEOUS | Status: DC
  Administered 2018-03-26: 10 [IU] via SUBCUTANEOUS
  Filled 2018-03-25 (×2): qty 0.1

## 2018-03-25 MED ORDER — ACETAMINOPHEN 650 MG RE SUPP: 650.0000 mg | Freq: Four times a day (QID) | RECTAL | Status: DC | PRN

## 2018-03-25 MED ORDER — CALCITRIOL 0.5 MCG PO CAPS
1.0000 ug | ORAL_CAPSULE | ORAL | Status: DC
  Administered 2018-03-25: 1 ug via ORAL
  Filled 2018-03-25 (×2): qty 2

## 2018-03-25 NOTE — Consult Note (Signed)
Janet Mitchell Renal Consultation Note  Requesting MD: Memon  Indication for Consultation:  ESRD  Chief complaint:  Swelling and leg pain  HPI:  Janet Mitchell is a 52 y.o. female with a history of ESRD on peritoneal dialysis and followed by Dr. Hinda Lenis who presented to the ER with leg pain and swelling and groin pain which became so great that she presented to the ER.  She reports that she last saw Dr. Hinda Lenis on 12/16.  The patient has states that she had gallbladder surgery recently.  She stopped the cycler for 4 days and did manual exchanges during that time and then by 12/18, she was back on the peritoneal dialysis machine without complications.  For the past couple of days, the patient has had an error message when the machine is trying to fill.  She states that she normally does 6 cycles of PD over 9 to 10 hours with fill volumes of 2 L.  She does have a last bag fill of extraneal which is 2 L.  No midday exchange.  The patient states that she has gotten more swollen over time; swelling is improved by laying supine.  She has been able to do manual exchanges.  She states that when she talked to the PD nurse that she was told to call the company and she has not called them yet.  She is just on manual exchanges.  She states that she does not typically urinate much and she did not have urine output in response to the Lasix in the ER.  She is on the torsemide and normally urinates just a couple of times a day.  She denies any overt shortness of breath however has had a cough.  Of note, she states that her anemia is a chronic issue which they have had struggles with.  She has been seen at [facility] for [facility] eval.  ER spoke with radiology who feel catheter in appropriate position.   Creatinine, Ser  Date/Time Value Ref Range Status  03/25/2018 02:57 AM 16.28 (H) 0.44 - 1.00 mg/dL Final  03/24/2018 07:46 PM 16.08 (H) 0.44 - 1.00 mg/dL Final  03/11/2018 05:44 AM 14.88 (H) 0.44  - 1.00 mg/dL Final  03/10/2018 11:29 PM 14.44 (H) 0.44 - 1.00 mg/dL Final  03/10/2018 07:20 AM 13.31 (H) 0.44 - 1.00 mg/dL Final  02/23/2018 08:27 PM 14.00 (H) 0.44 - 1.00 mg/dL Final  11/09/2017 03:13 AM 13.18 (H) 0.44 - 1.00 mg/dL Final  11/08/2017 04:25 AM 14.12 (H) 0.44 - 1.00 mg/dL Final  11/07/2017 10:40 AM 14.63 (H) 0.44 - 1.00 mg/dL Final  11/06/2017 08:29 PM 13.93 (H) 0.44 - 1.00 mg/dL Final     PMHx:   Past Medical History:  Diagnosis Date  . Anemia   . CHF (congestive heart failure) ([place])   . Chronic cholecystitis with calculus   . ESRD (end stage renal disease) on dialysis [facility])    "peritoneal dialysis q hs" (03/09/2018)  . Headache    "a few/wk" (03/09/2018)  . History of blood transfusion 10/2017   "low blood count" (03/09/2018)  . Hypertension   . Spinal headache   . Type II diabetes mellitus ([place])     Past Surgical History:  Procedure Laterality Date  . AMPUTATION TOE Left 2013   Great toe  . CATARACT EXTRACTION W/ INTRAOCULAR LENS IMPLANT Right   . CESAREAN SECTION  1994; 1998  . CHOLECYSTECTOMY N/A 03/09/2018   Procedure: LAPAROSCOPIC CHOLECYSTECTOMY WITH INTRAOPERATIVE CHOLANGIOGRAM ERAS PATHWAY;  Surgeon:  Donnie Mesa, MD;  Location: [place];  Service: General;  Laterality: N/A;  . FLEXIBLE SIGMOIDOSCOPY N/A 11/24/2017   Procedure: Beryle Quant;  Surgeon: Daneil Dolin, MD;  [facility];  Service: Endoscopy;  Laterality: N/A;  . JOINT REPLACEMENT    . LAPAROSCOPIC CHOLECYSTECTOMY  03/09/2018  . PERITONEAL CATHETER INSERTION  2017  . TOTAL HIP ARTHROPLASTY Left 1997    Family Hx:  Family History  Problem Relation Age of Onset  . Heart disease Mother   . Thrombocytopenia Mother        TTP  . Heart failure Father   . Kidney disease Paternal Grandfather     Social History:  reports that she is a non-smoker but has been exposed to tobacco smoke. She has never used smokeless tobacco. She reports that she does not drink  alcohol or use drugs.  Allergies: No Known Allergies  Medications: Prior to Admission medications   Medication Sig Start Date End Date Taking? Authorizing Provider  amLODipine (NORVASC) 10 MG tablet Take 1 tablet (10 mg total) by mouth daily. 11/09/17  Yes Emokpae, Courage, MD  aspirin EC 81 MG tablet Take 81 mg by mouth daily.    Yes [provider]  calcitRIOL (ROCALTROL) 0.5 MCG capsule Take 0.5-1 mcg by mouth See admin instructions. 0.69mcg on Mondays through Fridays and take 59mcg on Saturdays and Sundays   Yes [provider]  doxazosin (CARDURA) 4 MG tablet Take 4 mg by mouth at bedtime.   Yes [provider]  Insulin Glargine (TOUJEO MAX SOLOSTAR) 300 UNIT/ML SOPN Inject 14 Units into the skin at bedtime. 11/09/17  Yes Emokpae, Courage, MD  labetalol (NORMODYNE) 200 MG tablet Take 200 mg by mouth 2 (two) times daily.   Yes [provider]  oxyCODONE (OXY IR/ROXICODONE) 5 MG immediate release tablet Take 1 tablet (5 mg total) by mouth every 6 (six) hours as needed for severe pain. 03/09/18  Yes Donnie Mesa, MD  torsemide (DEMADEX) 100 MG tablet Take 1 tablet (100 mg total) by mouth at bedtime. 11/09/17  Yes Emokpae, Courage, MD  HYDROcodone-acetaminophen (NORCO) 5-325 MG tablet Take 1 tablet by mouth every 4 (four) hours as needed for severe pain. Patient not taking: Reported on 03/24/2018 02/23/18   Sherwood Gambler, MD  metoprolol succinate (TOPROL-XL) 100 MG 24 hr tablet Take 1 tablet (100 mg total) by mouth daily. Take with or immediately following a meal. Patient not taking: Reported on 02/23/2018 11/09/17   Roxan Hockey, MD  ondansetron (ZOFRAN ODT) 4 MG disintegrating tablet Take 1 tablet (4 mg total) by mouth every 8 (eight) hours as needed for nausea or vomiting. Patient not taking: Reported on 03/24/2018 02/23/18   Sherwood Gambler, MD  polyethylene glycol-electrolytes (TRILYTE) 420 g solution Take 4,000 mLs by mouth as directed. Patient not  taking: Reported on 02/23/2018 10/30/17   Carlis Stable, NP   Medication list reviewed   Labs:  BMP Latest Ref Rng & Units 03/25/2018 03/24/2018 03/11/2018  Glucose 70 - 99 mg/dL 107(H) 140(H) 81  BUN 6 - 20 mg/dL 72(H) 70(H) 59(H)  Creatinine 0.44 - 1.00 mg/dL 16.28(H) 16.08(H) 14.88(H)  Sodium 135 - 145 mmol/L 134(L) 133(L) 133(L)  Potassium 3.5 - 5.1 mmol/L 4.4 4.2 3.8  Chloride 98 - 111 mmol/L 93(L) 92(L) 92(L)  CO2 22 - 32 mmol/L 24 24 24   Calcium 8.9 - 10.3 mg/dL 7.8(L) 7.8(L) 8.3(L)   ROS:  Pertinent items noted in HPI and remainder of comprehensive ROS otherwise  negative.  Physical Exam: Vitals:   03/25/18 0530 03/25/18 0545  BP: (!) 169/80 (!) 169/89  Pulse: 77 76  Resp:    Temp:    SpO2: 99% 100%     General adult female in bed in no acute distress HEENT normocephalic atraumatic extraocular movements intact sclera anicteric Neck supple trachea midline Lungs clear to auscultation bilaterally normal work of breathing at rest  Heart regular rate and rhythm no rubs or gallops appreciated Abdomen soft nontender nondistended; tenckhoff catheter Extremities no pitting lower extremity edema  Psych normal mood and affect  Assessment/Plan: # ESRD - PD today - attempt PD with cycler here per home regimen  # peritoneal dialysis complication - machine giving error messages and patient having to do manual drains - now with groin pain and swelling  - attempt PD with cycler here to determine if machine is the issue  # Anemia  - Secondary to chronic disease - may require transfusion if hemoglobin drops further  - on ESA as outpatient per patient   # HTN  - Continue home regimen   Claudia Desanctis 03/25/2018, 10:09 AM

## 2018-03-25 NOTE — H&P (Signed)
History and Physical    Janet Mitchell TDH:[id number] DOB: [birth year] DOA: 03/24/2018  Referring MD/NP/PA:   PCP: Lucianne Lei, MD   Patient coming from:  The patient is coming from home.  At baseline, pt is independent for most of ADL.        Chief Complaint: Anasarca  HPI: Janet Mitchell is a 52 y.o. female with medical history significant of ESRD on peritoneal dialysis at home, hypertension, diabetes mellitus, macrocytic anemia, CHF, recent laparoscopic cholecystectomy 03/09/2018, who presents with anasarca.  Pt states that she underwent laparoscopic cholecystectomy on 12/13, and resumed her usual peritoneal dialysis on 12/18.  She states that her edema has been progressively getting worse after the surgery.  Now she has severe bilateral leg edema which is spreading up to her abdomen. She reports it is now difficult for her to move her legs or walk.  Patient has mild shortness of breath and mild dry cough, which are chronic issue, has only slightly worsened.  Patient does not have abdominal pain, fever or chill.  She has nausea, no vomiting.  She has some mild loose stool bowel movement intermittently.  Denies chest pain, symptoms of UTI or unilateral weakness.  ED Course: pt was found to have WBC 8.9, potassium 4.2, bicarbonate 24, creatinine 16.08, BUN 70, lipase 22, temperature normal, no tachycardia, no tachypnea, oxygen saturation 97% on room air.  Chest x-ray showed some nonspecific incre adjusted to have another 52 year old who reviewed non-STEMI and troponin up to 1.2.  Of sepsis pneumonia GI bleeding family family decided comfort care and pain is reasonable to make decision for voice ordered anything which is the main cause ased pulmonary interstitial change. patient is placed on MedSurg bed for observation.  Nephrology, Dr. Royce Macadamia was consulted by EDP.  CT-abdomen/pelvis showed:  1. Status post prior cholecystectomy without evidence of complication in the gallbladder  fossa. 2. Moderate ascites. 3. Ground-glass and nodular densities noted throughout bilateral lung bases. This is nonspecific but can be seen in infectious/inflammatory etiology.  Review of Systems:   General: no fevers, chills, has fatigue HEENT: no blurry vision, hearing changes or sore throat Respiratory: has dyspnea, coughing, no wheezing CV: no chest pain, no palpitations GI: has nausea and loose stool, No vomiting, abdominal pain, constipation. Has abdomen swelling. GU: no dysuria, burning on urination, increased urinary frequency, hematuria  Ext: has leg edema Neuro: no unilateral weakness, numbness, or tingling, no vision change or hearing loss Skin: no rash, no skin tear. MSK: No muscle spasm, no deformity, no limitation of range of movement in spin Heme: No easy bruising.  Travel history: No recent long distant travel.  Allergy: No Known Allergies  Past Medical History:  Diagnosis Date  . Anemia   . CHF (congestive heart failure) ([place])   . Chronic cholecystitis with calculus   . ESRD (end stage renal disease) on dialysis [facility])    "peritoneal dialysis q hs" (03/09/2018)  . Headache    "a few/wk" (03/09/2018)  . History of blood transfusion 10/2017   "low blood count" (03/09/2018)  . Hypertension   . Spinal headache   . Type II diabetes mellitus ([place])     Past Surgical History:  Procedure Laterality Date  . AMPUTATION TOE Left 2013   Great toe  . CATARACT EXTRACTION W/ INTRAOCULAR LENS IMPLANT Right   . CESAREAN SECTION  1994; 1998  . CHOLECYSTECTOMY N/A 03/09/2018   Procedure: LAPAROSCOPIC CHOLECYSTECTOMY WITH INTRAOPERATIVE CHOLANGIOGRAM ERAS PATHWAY;  Surgeon: Donnie Mesa, MD;  Location: [place];  Service: General;  Laterality: N/A;  . FLEXIBLE SIGMOIDOSCOPY N/A 11/24/2017   Procedure: FLEXIBLE SIGMOIDOSCOPY;  Surgeon: Daneil Dolin, MD;  [facility];  Service: Endoscopy;  Laterality: N/A;  . JOINT REPLACEMENT    . LAPAROSCOPIC  CHOLECYSTECTOMY  03/09/2018  . PERITONEAL CATHETER INSERTION  2017  . TOTAL HIP ARTHROPLASTY Left 1997    Social History:  reports that she is a non-smoker but has been exposed to tobacco smoke. She has never used smokeless tobacco. She reports that she does not drink alcohol or use drugs.  Family History:  Family History  Problem Relation Age of Onset  . Heart disease Mother   . Thrombocytopenia Mother        TTP  . Heart failure Father   . Kidney disease Paternal Grandfather      Prior to Admission medications   Medication Sig Start Date End Date Taking? Authorizing Provider  amLODipine (NORVASC) 10 MG tablet Take 1 tablet (10 mg total) by mouth daily. 11/09/17  Yes Emokpae, Courage, MD  aspirin EC 81 MG tablet Take 81 mg by mouth daily.    Yes [provider]  calcitRIOL (ROCALTROL) 0.5 MCG capsule Take 0.5-1 mcg by mouth See admin instructions. 0.24mcg on Mondays through Fridays and take 66mcg on Saturdays and Sundays   Yes [provider]  doxazosin (CARDURA) 4 MG tablet Take 4 mg by mouth at bedtime.   Yes [provider]  Insulin Glargine (TOUJEO MAX SOLOSTAR) 300 UNIT/ML SOPN Inject 14 Units into the skin at bedtime. 11/09/17  Yes Emokpae, Courage, MD  labetalol (NORMODYNE) 200 MG tablet Take 200 mg by mouth 2 (two) times daily.   Yes [provider]  oxyCODONE (OXY IR/ROXICODONE) 5 MG immediate release tablet Take 1 tablet (5 mg total) by mouth every 6 (six) hours as needed for severe pain. 03/09/18  Yes Donnie Mesa, MD  torsemide (DEMADEX) 100 MG tablet Take 1 tablet (100 mg total) by mouth at bedtime. 11/09/17  Yes Emokpae, Courage, MD  HYDROcodone-acetaminophen (NORCO) 5-325 MG tablet Take 1 tablet by mouth every 4 (four) hours as needed for severe pain. Patient not taking: Reported on 03/24/2018 02/23/18   Sherwood Gambler, MD  metoprolol succinate (TOPROL-XL) 100 MG 24 hr tablet Take 1 tablet (100 mg total) by mouth daily. Take with or  immediately following a meal. Patient not taking: Reported on 02/23/2018 11/09/17   Roxan Hockey, MD  ondansetron (ZOFRAN ODT) 4 MG disintegrating tablet Take 1 tablet (4 mg total) by mouth every 8 (eight) hours as needed for nausea or vomiting. Patient not taking: Reported on 03/24/2018 02/23/18   Sherwood Gambler, MD  polyethylene glycol-electrolytes (TRILYTE) 420 g solution Take 4,000 mLs by mouth as directed. Patient not taking: Reported on 02/23/2018 10/30/17   Carlis Stable, NP    Physical Exam: Vitals:   03/25/18 0145 03/25/18 0200 03/25/18 0215 03/25/18 0230  BP: 129/73 138/71 (!) 142/84 139/87  Pulse: 77 83 81 77  Resp:      Temp:      TempSrc:      SpO2: 92% 96% 96%   Weight:      Height:       General: Not in acute distress. Has anasarca HEENT:       Eyes: PERRL, EOMI, no scleral icterus.       ENT: No discharge from the ears and nose, no pharynx injection, no tonsillar enlargement.        Neck: No JVD, no  bruit, no mass felt. Heme: No neck lymph node enlargement. Cardiac: S1/S2, RRR, No murmurs, No gallops or rubs. Respiratory: No rales, wheezing, rhonchi or rubs. GI: Soft, nontender, no rebound pain, no organomegaly, BS present.  Has abdominal swelling.  Has a peritoneal dialysis catheter with clean surroundings GU: No hematuria Ext: 3+ pitting leg edema bilaterally. 2+DP/PT pulse bilaterally. Musculoskeletal: No joint deformities, No joint redness or warmth, no limitation of ROM in spin. Skin: No rashes.  Neuro: Alert, oriented X3, cranial nerves II-XII grossly intact, moves all extremities normally.   Psych: Patient is not psychotic, no suicidal or hemocidal ideation.  Labs on Admission: I have personally reviewed following labs and imaging studies  CBC: Recent Labs  Lab 03/24/18 1946  WBC 8.9  HGB 7.3*  HCT 24.1*  MCV 79.8*  PLT 409   Basic Metabolic Panel: Recent Labs  Lab 03/24/18 1946  NA 133*  K 4.2  CL 92*  CO2 24  GLUCOSE 140*  BUN 70*   CREATININE 16.08*  CALCIUM 7.8*   GFR: Estimated Creatinine Clearance: 5.7 mL/min (A) (by C-G formula based on SCr of 16.08 mg/dL (H)). Liver Function Tests: Recent Labs  Lab 03/24/18 1946  AST 12*  ALT <5  ALKPHOS 87  BILITOT 0.4  PROT 6.7  ALBUMIN 2.1*   Recent Labs  Lab 03/24/18 1946  LIPASE 22   No results for input(s): AMMONIA in the last 168 hours. Coagulation Profile: No results for input(s): INR, PROTIME in the last 168 hours. Cardiac Enzymes: No results for input(s): CKTOTAL, CKMB, CKMBINDEX, TROPONINI in the last 168 hours. BNP (last 3 results) No results for input(s): PROBNP in the last 8760 hours. HbA1C: No results for input(s): HGBA1C in the last 72 hours. CBG: No results for input(s): GLUCAP in the last 168 hours. Lipid Profile: No results for input(s): CHOL, HDL, LDLCALC, TRIG, CHOLHDL, LDLDIRECT in the last 72 hours. Thyroid Function Tests: No results for input(s): TSH, T4TOTAL, FREET4, T3FREE, THYROIDAB in the last 72 hours. Anemia Panel: No results for input(s): VITAMINB12, FOLATE, FERRITIN, TIBC, IRON, RETICCTPCT in the last 72 hours. Urine analysis: No results found for: COLORURINE, APPEARANCEUR, LABSPEC, PHURINE, GLUCOSEU, HGBUR, BILIRUBINUR, KETONESUR, PROTEINUR, UROBILINOGEN, NITRITE, LEUKOCYTESUR Sepsis Labs: @LABRCNTIP (procalcitonin:4,lacticidven:4) )No results found for this or any previous visit (from the past 240 hour(s)).   Radiological Exams on Admission: Ct Abdomen Pelvis Wo Contrast  Addendum Date: 03/25/2018   ADDENDUM REPORT: 03/25/2018 01:03 ADDENDUM: In the findings of the report should say a dialysis catheter (instead of supra pubic catheter) is identified in the pelvis. Electronically Signed   By: Abelardo Diesel M.D.   On: 03/25/2018 01:03   Result Date: 03/25/2018 CLINICAL DATA:  Patient had gallbladder surgery on March 09, 2018. Patient complains of abdominal swelling since surgery. Patient is on dialysis. EXAM: CT ABDOMEN  AND PELVIS WITHOUT CONTRAST TECHNIQUE: Multidetector CT imaging of the abdomen and pelvis was performed following the standard protocol without IV contrast. COMPARISON:  February 23, 2018 FINDINGS: Lower chest: Ground-glass and nodular densities are identified throughout the visualized bilateral lung bases. There is no pleural effusion. The heart size is upper limits of normal. Hepatobiliary: Noncontrast liver is normal. Patient status post prior cholecystectomy. The biliary tree is normal. Pancreas: Unremarkable. No pancreatic ductal dilatation or surrounding inflammatory changes. Spleen: Normal in size without focal abnormality. Adrenals/Urinary Tract: The adrenal glands are normal. The bilateral kidneys are atrophic. There is no hydronephrosis bilaterally. Fluid-filled bladder is normal. Stomach/Bowel: Stomach is within normal limits. Appendix appears  normal. No evidence of bowel wall thickening, distention, or inflammatory changes. Vascular/Lymphatic: The aorta is normal in caliber. Atherosclerosis of branching vessels of the aorta are noted. There is no abdominal or pelvic lymphadenopathy. Reproductive: There probable uterine fibroids unchanged compared prior exam. Bilateral adnexa are normal. Other: Suprapubic catheter is identified. There is moderate ascites in the abdomen and pelvis. Musculoskeletal: Degenerative joint changes of the spine are noted. IMPRESSION: Status post prior cholecystectomy without evidence of complication in the gallbladder fossa. Moderate ascites. Ground-glass and nodular densities noted throughout bilateral lung bases. This is nonspecific but can be seen in infectious/inflammatory etiology. Electronically Signed: By: Abelardo Diesel M.D. On: 03/25/2018 00:36   Dg Chest 2 View  Result Date: 03/24/2018 CLINICAL DATA:  Shortness of breath. EXAM: CHEST - 2 VIEW COMPARISON:  February 23, 2018 FINDINGS: The heart size and mediastinal contours are within normal limits. Increase pulmonary  interstitium and hazy opacity are identified throughout both lungs. Nodularities are identified in the right lung. There is no pleural effusion. The visualized skeletal structures are stable. IMPRESSION: Increased pulmonary interstitium with hazy opacity in both lungs with nodularities identified in the right lung. The findings are nonspecific but can be due to infectious/inflammatory process. Underlying pulmonary edema is possible. Electronically Signed   By: Abelardo Diesel M.D.   On: 03/24/2018 20:37     EKG: Independently reviewed.  Sinus rhythm, QTC 452, LAD, nonspecific T wave change.  Assessment/Plan Principal Problem:   Fluid overload Active Problems:   Hypertension   ESRD on peritoneal dialysis ([place])   Type II diabetes mellitus with renal manifestations (HCC)   Microcytic anemia   Fluid overload and ESRD on peritoneal dialysis: Patient developed anasarca after recent cholecystectomy, likely due to insufficient dialysis at home.  Potassium 4.2, bicarbonate 24, creatinine 16.08, BUN 70.  CT scan is negative for acute intracranial abnormalities and showed that dialysis catheter is in appropriate position.  Nephrology, Dr. Royce Macadamia was consulted by [place] patient on MedSurg bed for observation - 80 mg of Lasix was given in ED. - PRN albuterol nebulizer and Mucinex for cough -Continue torsemide 100 mg daily -Follow-up renal recommendations.  Microcytic anemia: Hemoglobin is at baseline.  Her baseline hemoglobin is 7-8.  Her hemoglobin is at 7.3 today. -Follow-up with CBC  Essential hypertension: -IV Hydralazine prn -Continue home medications: Amlodipine, Cardura, labetalol and torsemide  Type II diabetes mellitus with renal manifestations ([place]): Last A1c not on record. Patient is taking glargine insulin at home -will decrease glargine insulin from 14 to 10 units daily -SSI -Check A1c   DVT ppx: SQ Heparin        Code Status: Full code Family Communication:  Yes, patient's  husband   at bed side Disposition Plan:  Anticipate discharge back to previous home environment Consults called: Dr. Royce Macadamia of nephrology Admission status:   medical floor/obs      Date of Service 03/25/2018    Ivor Costa [facility] Pager [phone]  If 7PM-7AM, please contact night-coverage [email or web address] Password [email or web address] 03/25/2018, 2:38 AM

## 2018-03-25 NOTE — Progress Notes (Signed)
PROGRESS NOTE    Janet Mitchell  XFG:[id number] DOB: [birth year] DOA: 03/24/2018 PCP: Lucianne Lei, MD    Brief Narrative:  52 y/o female with ESRD on peritoneal dialysis who recently underwent cholecystectomy, has been having difficulty performing her PD for several days. She is admitted with increasing leg edema and volume overload. Nephrology consulted.   Assessment & Plan:   Principal Problem:   Fluid overload Active Problems:   Hypertension   ESRD on peritoneal dialysis ([place])   Type II diabetes mellitus with renal manifestations (HCC)   Microcytic anemia   1. ESRD on peritoneal dialysis with volume overload. Patient has evidence of volume overload. She reports that she has been unable to adequately perform peritoneal dialysis at home for the last several days. Nephrology has been consulted. CT abdomen showed peritoneal dialysis catheter in good position 2. Right leg edema. Likely related to volume overload. Venous dopplers have been ordered.  3. Anemia. Related to renal disease. Baseline hemoglobin 7-8. Current hemoglobin 7.3. continue to monitor 4. HTN. Currently stable. Continue on amldoipine, cardura, labetalol and torsemide 5. DM type 2 with renal manifestations. A1c has been ordered. She is lantus and SSI. Continue to follow blood sugars. 6. Recent cholecystectomy. No complaints of abdominal pain. CT abdomen unrevealing.   DVT prophylaxis: heparin Code Status: full code Family Communication: discussed with husband at the bedside Disposition Plan: discharge home once volume status improved   Consultants:   Nephrology  Procedures:     Antimicrobials:      Subjective: No shortness of breath. Having pain in her right leg. Uncomfortable laying in ED bed.  Objective: Vitals:   03/25/18 0500 03/25/18 0515 03/25/18 0530 03/25/18 0545  BP: (!) 172/92 (!) 168/89 (!) 169/80 (!) 169/89  Pulse: 80 76 77 76  Resp:      Temp:      TempSrc:      SpO2: 99% 100%  99% 100%  Weight:      Height:       No intake or output data in the 24 hours ending 03/25/18 1013 Filed Weights   03/24/18 1939  Weight: 118.8 kg    Examination:  General exam: Appears calm and comfortable  Respiratory system: crackles at bases. Respiratory effort normal. Cardiovascular system: S1 & S2 heard, RRR. No JVD, murmurs, rubs, gallops or clicks. 2+ pedal edema, R>L Gastrointestinal system: Abdomen is distended, soft and nontender. Peritoneal dialysis catheter in place. No organomegaly or masses felt. Normal bowel sounds heard. Central nervous system: Alert and oriented. No focal neurological deficits. Extremities: 5 x 5 power. Skin: No rashes, lesions or ulcers Psychiatry: Judgement and insight appear normal. Mood & affect appropriate.     Data Reviewed: I have personally reviewed following labs and imaging studies  CBC: Recent Labs  Lab 03/24/18 1946 03/25/18 0257  WBC 8.9 8.0  HGB 7.3* 7.2*  HCT 24.1* 24.0*  MCV 79.8* 78.7*  PLT 173 967   Basic Metabolic Panel: Recent Labs  Lab 03/24/18 1946 03/25/18 0257  NA 133* 134*  K 4.2 4.4  CL 92* 93*  CO2 24 24  GLUCOSE 140* 107*  BUN 70* 72*  CREATININE 16.08* 16.28*  CALCIUM 7.8* 7.8*   GFR: Estimated Creatinine Clearance: 5.6 mL/min (A) (by C-G formula based on SCr of 16.28 mg/dL (H)). Liver Function Tests: Recent Labs  Lab 03/24/18 1946  AST 12*  ALT <5  ALKPHOS 87  BILITOT 0.4  PROT 6.7  ALBUMIN 2.1*   Recent Labs  Lab 03/24/18  1946  LIPASE 22   No results for input(s): AMMONIA in the last 168 hours. Coagulation Profile: No results for input(s): INR, PROTIME in the last 168 hours. Cardiac Enzymes: No results for input(s): CKTOTAL, CKMB, CKMBINDEX, TROPONINI in the last 168 hours. BNP (last 3 results) No results for input(s): PROBNP in the last 8760 hours. HbA1C: Recent Labs    03/25/18 0257  HGBA1C 6.4*   CBG: Recent Labs  Lab 03/25/18 0957  GLUCAP 126*   Lipid  Profile: No results for input(s): CHOL, HDL, LDLCALC, TRIG, CHOLHDL, LDLDIRECT in the last 72 hours. Thyroid Function Tests: No results for input(s): TSH, T4TOTAL, FREET4, T3FREE, THYROIDAB in the last 72 hours. Anemia Panel: No results for input(s): VITAMINB12, FOLATE, FERRITIN, TIBC, IRON, RETICCTPCT in the last 72 hours. Sepsis Labs: No results for input(s): PROCALCITON, LATICACIDVEN in the last 168 hours.  No results found for this or any previous visit (from the past 240 hour(s)).       Radiology Studies: Ct Abdomen Pelvis Wo Contrast  Addendum Date: 03/25/2018   ADDENDUM REPORT: 03/25/2018 01:03 ADDENDUM: In the findings of the report should say a dialysis catheter (instead of supra pubic catheter) is identified in the pelvis. Electronically Signed   By: Abelardo Diesel M.D.   On: 03/25/2018 01:03   Result Date: 03/25/2018 CLINICAL DATA:  Patient had gallbladder surgery on March 09, 2018. Patient complains of abdominal swelling since surgery. Patient is on dialysis. EXAM: CT ABDOMEN AND PELVIS WITHOUT CONTRAST TECHNIQUE: Multidetector CT imaging of the abdomen and pelvis was performed following the standard protocol without IV contrast. COMPARISON:  February 23, 2018 FINDINGS: Lower chest: Ground-glass and nodular densities are identified throughout the visualized bilateral lung bases. There is no pleural effusion. The heart size is upper limits of normal. Hepatobiliary: Noncontrast liver is normal. Patient status post prior cholecystectomy. The biliary tree is normal. Pancreas: Unremarkable. No pancreatic ductal dilatation or surrounding inflammatory changes. Spleen: Normal in size without focal abnormality. Adrenals/Urinary Tract: The adrenal glands are normal. The bilateral kidneys are atrophic. There is no hydronephrosis bilaterally. Fluid-filled bladder is normal. Stomach/Bowel: Stomach is within normal limits. Appendix appears normal. No evidence of bowel wall thickening,  distention, or inflammatory changes. Vascular/Lymphatic: The aorta is normal in caliber. Atherosclerosis of branching vessels of the aorta are noted. There is no abdominal or pelvic lymphadenopathy. Reproductive: There probable uterine fibroids unchanged compared prior exam. Bilateral adnexa are normal. Other: Suprapubic catheter is identified. There is moderate ascites in the abdomen and pelvis. Musculoskeletal: Degenerative joint changes of the spine are noted. IMPRESSION: Status post prior cholecystectomy without evidence of complication in the gallbladder fossa. Moderate ascites. Ground-glass and nodular densities noted throughout bilateral lung bases. This is nonspecific but can be seen in infectious/inflammatory etiology. Electronically Signed: By: Abelardo Diesel M.D. On: 03/25/2018 00:36   Dg Chest 2 View  Result Date: 03/24/2018 CLINICAL DATA:  Shortness of breath. EXAM: CHEST - 2 VIEW COMPARISON:  February 23, 2018 FINDINGS: The heart size and mediastinal contours are within normal limits. Increase pulmonary interstitium and hazy opacity are identified throughout both lungs. Nodularities are identified in the right lung. There is no pleural effusion. The visualized skeletal structures are stable. IMPRESSION: Increased pulmonary interstitium with hazy opacity in both lungs with nodularities identified in the right lung. The findings are nonspecific but can be due to infectious/inflammatory process. Underlying pulmonary edema is possible. Electronically Signed   By: Abelardo Diesel M.D.   On: 03/24/2018 20:37  Scheduled Meds: . amLODipine  10 mg Oral Daily  . aspirin EC  81 mg Oral Daily  . calcitRIOL  0.5-1 mcg Oral See admin instructions  . doxazosin  4 mg Oral QHS  . heparin  5,000 Units Subcutaneous Q8H  . insulin aspart  0-9 Units Subcutaneous TID WC  . Insulin Glargine  10 Units Subcutaneous QHS  . labetalol  200 mg Oral BID  . torsemide  100 mg Oral QHS   Continuous  Infusions:   LOS: 0 days    Time spent: 11mins    Kathie Dike, MD [facility] Pager [phone]  If 7PM-7AM, please contact night-coverage [email or web address] Password [email or web address] 03/25/2018, 10:13 AM

## 2018-03-25 NOTE — Progress Notes (Signed)
VASCULAR LAB PRELIMINARY  PRELIMINARY  PRELIMINARY  PRELIMINARY  Right lower extremity venous duplex completed.    Preliminary report:  There is no DVT or SVT noted in the right lower extremity.   Kayde Atkerson, RVT 03/25/2018, 1:27 PM

## 2018-03-26 ENCOUNTER — Encounter (HOSPITAL_COMMUNITY): Payer: Self-pay | Admitting: *Deleted

## 2018-03-26 DIAGNOSIS — I10 Essential (primary) hypertension: Secondary | ICD-10-CM | POA: Diagnosis not present

## 2018-03-26 DIAGNOSIS — D631 Anemia in chronic kidney disease: Secondary | ICD-10-CM | POA: Diagnosis not present

## 2018-03-26 DIAGNOSIS — I120 Hypertensive chronic kidney disease with stage 5 chronic kidney disease or end stage renal disease: Secondary | ICD-10-CM | POA: Diagnosis not present

## 2018-03-26 DIAGNOSIS — E8770 Fluid overload, unspecified: Secondary | ICD-10-CM | POA: Diagnosis not present

## 2018-03-26 DIAGNOSIS — N186 End stage renal disease: Secondary | ICD-10-CM | POA: Diagnosis not present

## 2018-03-26 DIAGNOSIS — Z992 Dependence on renal dialysis: Secondary | ICD-10-CM | POA: Diagnosis not present

## 2018-03-26 DIAGNOSIS — Z794 Long term (current) use of insulin: Secondary | ICD-10-CM | POA: Diagnosis not present

## 2018-03-26 DIAGNOSIS — N185 Chronic kidney disease, stage 5: Secondary | ICD-10-CM | POA: Diagnosis not present

## 2018-03-26 DIAGNOSIS — E1122 Type 2 diabetes mellitus with diabetic chronic kidney disease: Secondary | ICD-10-CM | POA: Diagnosis not present

## 2018-03-26 DIAGNOSIS — R600 Localized edema: Secondary | ICD-10-CM | POA: Diagnosis not present

## 2018-03-26 LAB — RENAL FUNCTION PANEL
Albumin: 1.9 g/dL — ABNORMAL LOW (ref 3.5–5.0)
Anion gap: 15 (ref 5–15)
BUN: 67 mg/dL — ABNORMAL HIGH (ref 6–20)
CHLORIDE: 95 mmol/L — AB (ref 98–111)
CO2: 24 mmol/L (ref 22–32)
Calcium: 7.7 mg/dL — ABNORMAL LOW (ref 8.9–10.3)
Creatinine, Ser: 15.46 mg/dL — ABNORMAL HIGH (ref 0.44–1.00)
GFR calc Af Amer: 3 mL/min — ABNORMAL LOW (ref >60)
GFR calc non Af Amer: 2 mL/min — ABNORMAL LOW (ref >60)
Glucose, Bld: 122 mg/dL — ABNORMAL HIGH (ref 70–99)
POTASSIUM: 4.4 mmol/L (ref 3.5–5.1)
Phosphorus: 8.3 mg/dL — ABNORMAL HIGH (ref 2.5–4.6)
Sodium: 134 mmol/L — ABNORMAL LOW (ref 135–145)

## 2018-03-26 LAB — GLUCOSE, CAPILLARY
GLUCOSE-CAPILLARY: 97 mg/dL (ref 70–99)
Glucose-Capillary: 133 mg/dL — ABNORMAL HIGH (ref 70–99)
Glucose-Capillary: 96 mg/dL (ref 70–99)
Glucose-Capillary: 83 mg/dL (ref 70–99)
Glucose-Capillary: 130 mg/dL — ABNORMAL HIGH (ref 70–99)

## 2018-03-26 LAB — CBC
HEMATOCRIT: 23.3 % — AB (ref 36.0–46.0)
Hemoglobin: 7 g/dL — ABNORMAL LOW (ref 12.0–15.0)
MCH: 23.5 pg — ABNORMAL LOW (ref 26.0–34.0)
MCHC: 30 g/dL (ref 30.0–36.0)
MCV: 78.2 fL — ABNORMAL LOW (ref 80.0–100.0)
Platelets: 186 10*3/uL (ref 150–400)
RBC: 2.98 MIL/uL — ABNORMAL LOW (ref 3.87–5.11)
RDW: 17.6 % — ABNORMAL HIGH (ref 11.5–15.5)
WBC: 6.7 10*3/uL (ref 4.0–10.5)
nRBC: 0 % (ref 0.0–0.2)

## 2018-03-26 LAB — PREPARE RBC (CROSSMATCH)

## 2018-03-26 MED ORDER — GENTAMICIN SULFATE 0.1 % EX CREA: 1.0000 "application " | TOPICAL_CREAM | Freq: Every day | CUTANEOUS | Status: DC

## 2018-03-26 MED ORDER — DELFLEX-LC/2.5% DEXTROSE 394 MOSM/L IP SOLN: INTRAPERITONEAL | Status: DC

## 2018-03-26 MED ORDER — SODIUM CHLORIDE 0.9% IV SOLUTION: Freq: Once | INTRAVENOUS | Status: DC

## 2018-03-26 NOTE — Progress Notes (Addendum)
PROGRESS NOTE    Janet Mitchell   WGN:[id number]  DOB: 04-22-1965  DOA: 03/24/2018 PCP: Lucianne Lei, MD   Brief Narrative:  Janet Mitchell 52 y/o female with ESRD on peritoneal dialysis, HTN, DM 2 who has been having difficulty performing her PD for several days. She is admitted with increasing leg edema and volume overload.    Subjective: She has no complaints. Was dialyzed with machine in the hospital and did well. .     Assessment & Plan:   Principal Problem:   Fluid overload- ESRD on peritoneal dialysis [facility]) - likely has a malfunctioning machine at home- contacting the company to provide her with another. Will not be able to d/c until we have a good plan in place for outpt peritoneal dialysis.  - d/c foley cath  Active Problems:   Hypertension - Amlodipine, Demadex, Cardura resumed - Metoprolol on hold- BP is stable  Anemia of chronic disease (due to CKD) - nephrology to manage      Type II diabetes mellitus with renal manifestations  - on Lantus and SSI- A1c 6.4   Recent cholecystectomy (12/13) - is recovering well   DVT prophylaxis: Heparin Code Status: Full code Family Communication:  Disposition Plan: home  Consultants:   nephrology Procedures:   Peritoneal dialysis Antimicrobials:  Anti-infectives (From admission, onward)   None       Objective: Vitals:   03/25/18 2030 03/26/18 0505 03/26/18 0653 03/26/18 0808  BP: 135/73 111/65 125/67 134/69  Pulse: 70 78 79 78  Resp: (!) 21 18  19   Temp: 97.6 F (36.4 C) 98.7 F (37.1 C) 99.1 F (37.3 C) 98.8 F (37.1 C)  TempSrc: Oral Oral Oral Oral  SpO2: 96% (!) 89%  91%  Weight:   123.7 kg   Height:        Intake/Output Summary (Last 24 hours) at 03/26/2018 1613 Last data filed at 03/26/2018 1300 Gross per 24 hour  Intake 10789 ml  Output 100 ml  Net 10689 ml   Filed Weights   03/24/18 1939 03/25/18 1730 03/26/18 0653  Weight: 118.8 kg 126.3 kg 123.7 kg     Examination: General exam: Appears comfortable  HEENT: PERRLA, oral mucosa moist, no sclera icterus or thrush Respiratory system: Clear to auscultation. Respiratory effort normal. Cardiovascular system: S1 & S2 heard, RRR.   Gastrointestinal system: Abdomen soft, non-tender, nondistended. Normal bowel sounds. Central nervous system: Alert and oriented. No focal neurological deficits. Extremities: No cyanosis, clubbing or edema Skin: No rashes or ulcers Psychiatry:  Mood & affect appropriate.     Data Reviewed: I have personally reviewed following labs and imaging studies  CBC: Recent Labs  Lab 03/24/18 1946 03/25/18 0257 03/26/18 0542  WBC 8.9 8.0 6.7  HGB 7.3* 7.2* 7.0*  HCT 24.1* 24.0* 23.3*  MCV 79.8* 78.7* 78.2*  PLT 173 177 784   Basic Metabolic Panel: Recent Labs  Lab 03/24/18 1946 03/25/18 0257 03/26/18 0542  NA 133* 134* 134*  K 4.2 4.4 4.4  CL 92* 93* 95*  CO2 24 24 24   GLUCOSE 140* 107* 122*  BUN 70* 72* 67*  CREATININE 16.08* 16.28* 15.46*  CALCIUM 7.8* 7.8* 7.7*  PHOS  --   --  8.3*   GFR: Estimated Creatinine Clearance: 6 mL/min (A) (by C-G formula based on SCr of 15.46 mg/dL (H)). Liver Function Tests: Recent Labs  Lab 03/24/18 1946 03/26/18 0542  AST 12*  --   ALT <5  --   ALKPHOS 87  --  BILITOT 0.4  --   PROT 6.7  --   ALBUMIN 2.1* 1.9*   Recent Labs  Lab 03/24/18 1946  LIPASE 22   No results for input(s): AMMONIA in the last 168 hours. Coagulation Profile: No results for input(s): INR, PROTIME in the last 168 hours. Cardiac Enzymes: No results for input(s): CKTOTAL, CKMB, CKMBINDEX, TROPONINI in the last 168 hours. BNP (last 3 results) No results for input(s): PROBNP in the last 8760 hours. HbA1C: Recent Labs    03/25/18 0257  HGBA1C 6.4*   CBG: Recent Labs  Lab 03/25/18 1824 03/25/18 2030 03/26/18 0509 03/26/18 0720 03/26/18 1102  GLUCAP 70 130* 133* 97 96   Lipid Profile: No results for input(s): CHOL,  HDL, LDLCALC, TRIG, CHOLHDL, LDLDIRECT in the last 72 hours. Thyroid Function Tests: No results for input(s): TSH, T4TOTAL, FREET4, T3FREE, THYROIDAB in the last 72 hours. Anemia Panel: No results for input(s): VITAMINB12, FOLATE, FERRITIN, TIBC, IRON, RETICCTPCT in the last 72 hours. Urine analysis: No results found for: COLORURINE, APPEARANCEUR, LABSPEC, PHURINE, GLUCOSEU, HGBUR, BILIRUBINUR, KETONESUR, PROTEINUR, UROBILINOGEN, NITRITE, LEUKOCYTESUR Sepsis Labs: @LABRCNTIP (procalcitonin:4,lacticidven:4) )No results found for this or any previous visit (from the past 240 hour(s)).       Radiology Studies: Ct Abdomen Pelvis Wo Contrast  Addendum Date: 03/25/2018   ADDENDUM REPORT: 03/25/2018 01:03 ADDENDUM: In the findings of the report should say a dialysis catheter (instead of supra pubic catheter) is identified in the pelvis. Electronically Signed   By: Abelardo Diesel M.D.   On: 03/25/2018 01:03   Result Date: 03/25/2018 CLINICAL DATA:  Patient had gallbladder surgery on March 09, 2018. Patient complains of abdominal swelling since surgery. Patient is on dialysis. EXAM: CT ABDOMEN AND PELVIS WITHOUT CONTRAST TECHNIQUE: Multidetector CT imaging of the abdomen and pelvis was performed following the standard protocol without IV contrast. COMPARISON:  February 23, 2018 FINDINGS: Lower chest: Ground-glass and nodular densities are identified throughout the visualized bilateral lung bases. There is no pleural effusion. The heart size is upper limits of normal. Hepatobiliary: Noncontrast liver is normal. Patient status post prior cholecystectomy. The biliary tree is normal. Pancreas: Unremarkable. No pancreatic ductal dilatation or surrounding inflammatory changes. Spleen: Normal in size without focal abnormality. Adrenals/Urinary Tract: The adrenal glands are normal. The bilateral kidneys are atrophic. There is no hydronephrosis bilaterally. Fluid-filled bladder is normal. Stomach/Bowel: Stomach  is within normal limits. Appendix appears normal. No evidence of bowel wall thickening, distention, or inflammatory changes. Vascular/Lymphatic: The aorta is normal in caliber. Atherosclerosis of branching vessels of the aorta are noted. There is no abdominal or pelvic lymphadenopathy. Reproductive: There probable uterine fibroids unchanged compared prior exam. Bilateral adnexa are normal. Other: Suprapubic catheter is identified. There is moderate ascites in the abdomen and pelvis. Musculoskeletal: Degenerative joint changes of the spine are noted. IMPRESSION: Status post prior cholecystectomy without evidence of complication in the gallbladder fossa. Moderate ascites. Ground-glass and nodular densities noted throughout bilateral lung bases. This is nonspecific but can be seen in infectious/inflammatory etiology. Electronically Signed: By: Abelardo Diesel M.D. On: 03/25/2018 00:36   Dg Chest 2 View  Result Date: 03/24/2018 CLINICAL DATA:  Shortness of breath. EXAM: CHEST - 2 VIEW COMPARISON:  February 23, 2018 FINDINGS: The heart size and mediastinal contours are within normal limits. Increase pulmonary interstitium and hazy opacity are identified throughout both lungs. Nodularities are identified in the right lung. There is no pleural effusion. The visualized skeletal structures are stable. IMPRESSION: Increased pulmonary interstitium with hazy opacity in both lungs  with nodularities identified in the right lung. The findings are nonspecific but can be due to infectious/inflammatory process. Underlying pulmonary edema is possible. Electronically Signed   By: Abelardo Diesel M.D.   On: 03/24/2018 20:37   Vas [place] Lower Extremity Venous (dvt)  Result Date: 03/25/2018  Lower Venous Study Indications: Edema.  Risk Factors: ESRD, recent cholecystectomy. Limitations: Edema. Performing Technologist: Sharion Dove RVS  Examination Guidelines: A complete evaluation includes B-mode imaging, spectral Doppler, color  Doppler, and power Doppler as needed of all accessible portions of each vessel. Bilateral testing is considered an integral part of a complete examination. Limited examinations for reoccurring indications may be performed as noted.  Right Venous Findings: +---------+---------------+---------+-----------+----------+-------+          CompressibilityPhasicitySpontaneityPropertiesSummary +---------+---------------+---------+-----------+----------+-------+ CFV      Full           Yes      Yes                          +---------+---------------+---------+-----------+----------+-------+ SFJ      Full                                                 +---------+---------------+---------+-----------+----------+-------+ FV Prox  Full                                                 +---------+---------------+---------+-----------+----------+-------+ FV Mid   Full                                                 +---------+---------------+---------+-----------+----------+-------+ FV DistalFull                                                 +---------+---------------+---------+-----------+----------+-------+ PFV      Full                                                 +---------+---------------+---------+-----------+----------+-------+ POP      Full           Yes      Yes                          +---------+---------------+---------+-----------+----------+-------+ PTV      Full                                                 +---------+---------------+---------+-----------+----------+-------+  Right Technical Findings: Not visualized segments include peroneal.  Left Venous Findings: +---+---------------+---------+-----------+----------+-------+    CompressibilityPhasicitySpontaneityPropertiesSummary +---+---------------+---------+-----------+----------+-------+ CFVFull           Yes      Yes                           +---+---------------+---------+-----------+----------+-------+  Summary: Right: There is no evidence of deep vein thrombosis in the lower extremity. However, portions of this examination were limited- see technologist comments above. Ultrasound characteristics of enlarged lymph nodes are noted in the groin. Left: There is no evidence of deep vein thrombosis in the lower extremity.  *See table(s) above for measurements and observations.    Preliminary       Scheduled Meds: . amLODipine  10 mg Oral Daily  . aspirin EC  81 mg Oral Daily  . calcitRIOL  0.5 mcg Oral Once per day on Mon Tue Wed Thu Fri  . calcitRIOL  1 mcg Oral Once per day on Sun Sat  . doxazosin  4 mg Oral QHS  . gentamicin cream  1 application Topical Daily  . heparin  5,000 Units Subcutaneous Q8H  . insulin aspart  0-9 Units Subcutaneous TID WC  . insulin glargine  10 Units Subcutaneous QHS  . labetalol  200 mg Oral BID  . torsemide  100 mg Oral QHS   Continuous Infusions:   LOS: 0 days    Time spent in minutes: 35    Debbe Odea, MD [facility] Pager: [email or web address] Password [email or web address] 03/26/2018, 4:13 PM

## 2018-03-26 NOTE — Evaluation (Signed)
Physical Therapy Evaluation Patient Details Name: Janet Mitchell MRN: [id number] DOB: 06-24-1965 Today's Date: 03/26/2018   History of Present Illness  Pt is a 52 y/o female with PMH of ESRD on PD, recent cholecystectomy, DM II, HTN, CHF. She presents after having difficulty performing PD for several days and was admitted with increasing LEE and volume overload.   Clinical Impression  Pt admitted with above diagnosis. Pt currently with functional limitations due to the deficits listed below (see PT Problem List). At the time of PT eval pt was able to perform transfers and ambulation with min guard assist for balance support and safety with RW use. Pt's main complaint is pain with active movement in the RLE. Functional deficits inconsistent, and pt appears to have less difficulty moving the RLE when she is distracted or engaged in conversation. Pt hesitant to agree to home health PT at d/c due to social factors, however at end of session she did state she would like HHPT to come out. Pt reports she feels confident that she can return home today and will not have any difficulty entering her home. With RW and husband to assist, feel she will be safe to d/c home this afternoon. Acutely, pt will benefit from skilled PT to increase their independence and safety with mobility to allow discharge to the venue listed below.       Follow Up Recommendations Home health PT;Supervision for mobility/OOB    Equipment Recommendations  Rolling walker with 5" wheels;3in1 (PT)    Recommendations for Other Services       Precautions / Restrictions Precautions Precautions: Fall Restrictions Weight Bearing Restrictions: No      Mobility  Bed Mobility Overal bed mobility: Modified Independent             General bed mobility comments: HOB flat and no use of rails.   Transfers Overall transfer level: Needs assistance Equipment used: Rolling walker (2 wheeled) Transfers: Sit to/from Stand Sit to  Stand: Supervision         General transfer comment: Pt demonstrated proper hand placement on seated surface for safety. pt was able to power-up to full stand with close guard for safety. No assist required, however pt utilizing momentum and rocking back and forth to achieve full stand.   Ambulation/Gait Ambulation/Gait assistance: Min guard Gait Distance (Feet): 75 Feet Assistive device: Rolling walker (2 wheeled) Gait Pattern/deviations: Step-to pattern;Step-through pattern;Decreased stride length;Decreased step length - right;Decreased weight shift to right Gait velocity: Decreased Gait velocity interpretation: <1.8 ft/sec, indicate of risk for recurrent falls General Gait Details: Pt had difficulty advancing RLE due to pain in R hip during swing through phase of gait cycle. Pt moving very slow and guarded when not talking however appeared to be able to move the RLE much easier when pt engaged in conversation. VC's for sequencing and safety with RW use.   Stairs         General stair comments: Unable to tolerate stair training this session after gait training due to pain. Pt was educated on proper sequencing to limit stress on R LE when she gets home.   Wheelchair Mobility    Modified Rankin (Stroke Patients Only)       Balance Overall balance assessment: Needs assistance Sitting-balance support: Feet supported;No upper extremity supported Sitting balance-Leahy Scale: Good     Standing balance support: No upper extremity supported;Bilateral upper extremity supported Standing balance-Leahy Scale: Poor Standing balance comment: Statically, pt at a "Fair" level. Dynamically, requiring support of  RW.                              Pertinent Vitals/Pain Pain Assessment: Faces Faces Pain Scale: Hurts even more Pain Location: R hip Pain Descriptors / Indicators: Discomfort;Grimacing Pain Intervention(s): Limited activity within patient's tolerance;Monitored during  session    Home Living Family/patient expects to be discharged to:: Private residence Living Arrangements: Spouse/significant other;Other relatives Available Help at Discharge: Family;Available 24 hours/day Type of Home: House Home Access: Stairs to enter Entrance Stairs-Rails: Left("Homemade" and unsteady) Technical brewer of Steps: 3 Home Layout: One level Home Equipment: Hand held shower head      Prior Function Level of Independence: Needs assistance   Gait / Transfers Assistance Needed: Pt does not use an AD however reports feeling unsteady often.   ADL's / Homemaking Assistance Needed: Husband provides supervision for showering and stepping in/out of tub.        Hand Dominance        Extremity/Trunk Assessment   Upper Extremity Assessment Upper Extremity Assessment: Generalized weakness    Lower Extremity Assessment Lower Extremity Assessment: RLE deficits/detail RLE Deficits / Details: Painful RLE. Pt with 4+/5 strength in quads and hamstrings, and 2/5 strength in hip flexor. Pt not able to raise against gravity due to pain.  RLE: Unable to fully assess due to pain RLE Sensation: WNL RLE Coordination: WNL    Cervical / Trunk Assessment Cervical / Trunk Assessment: Normal  Communication   Communication: No difficulties  Cognition Arousal/Alertness: Awake/alert Behavior During Therapy: Anxious Overall Cognitive Status: Within Functional Limits for tasks assessed                                        General Comments      Exercises General Exercises - Lower Extremity Ankle Circles/Pumps: 10 reps Long Arc Quad: 10 reps   Assessment/Plan    PT Assessment Patient needs continued PT services  PT Problem List Decreased strength;Decreased activity tolerance;Decreased balance;Decreased mobility;Decreased knowledge of use of DME;Decreased safety awareness;Decreased knowledge of precautions;Pain;Obesity       PT Treatment  Interventions DME instruction;Gait training;Stair training;Functional mobility training;Therapeutic activities;Therapeutic exercise;Neuromuscular re-education;Patient/family education    PT Goals (Current goals can be found in the Care Plan section)  Acute Rehab PT Goals Patient Stated Goal: "I don't want to lose my mobility" PT Goal Formulation: With patient Time For Goal Achievement: 04/09/18 Potential to Achieve Goals: Good    Frequency Min 3X/week   Barriers to discharge Inaccessible home environment;Decreased caregiver support Pt reports her home is cluttered "like hoarders", and there are several extended family members staying in the house with her and her husband.     Co-evaluation               AM-PAC PT "6 Clicks" Mobility  Outcome Measure Help needed turning from your back to your side while in a flat bed without using bedrails?: None Help needed moving from lying on your back to sitting on the side of a flat bed without using bedrails?: None Help needed moving to and from a bed to a chair (including a wheelchair)?: A Little Help needed standing up from a chair using your arms (e.g., wheelchair or bedside chair)?: A Little Help needed to walk in hospital room?: A Little Help needed climbing 3-5 steps with a railing? : A Little 6  Click Score: 20    End of Session Equipment Utilized During Treatment: Gait belt Activity Tolerance: Patient tolerated treatment well Patient left: with call bell/phone within reach;Other (comment)(Sitting EOB per pt request call bell in reach. ) Nurse Communication: Mobility status PT Visit Diagnosis: Pain;Difficulty in walking, not elsewhere classified (R26.2);Muscle weakness (generalized) (M62.81) Pain - Right/Left: Right Pain - part of body: Hip    Time: 1402-1440 PT Time Calculation (min) (ACUTE ONLY): 38 min   Charges:   PT Evaluation $PT Eval Moderate Complexity: 1 Mod PT Treatments $Gait Training: 23-37 mins         Rolinda Roan, PT, DPT Acute Rehabilitation Services Pager: [phone] Office: [phone]   Thelma Comp 03/26/2018, 3:06 PM

## 2018-03-26 NOTE — Care Management Obs Status (Signed)
[facility] NOTIFICATION   Patient Details  Name: Temeca Somma MRN: [id number] Date of Birth: March 23, 1966   Medicare Observation Status Notification Given:  Yes    Bartholomew Crews, RN 03/26/2018, 1:11 PM

## 2018-03-26 NOTE — Progress Notes (Signed)
[facility] Progress Note  Name: Janet Mitchell MRN: [id number] DOB: 10-Feb-1966  Chief Complaint:  PD machine error and swelling  Subjective:  Spoke with dialysis RN - patient underwent treatment here without issues.  She had UF of 950 mL this AM.  Spoke with pt and husband at bedside - she has called the company that makes her PD machine and they have said they can get her another machine in 24 hours - they just need the number off of it which her husband can go home to get.  She is ok with getting a unit of PRBC's.    Review of systems:  Denies shortness of breath or chest pain Groin pain and swelling improving  Denies n/v  ---------- Background:  Janet Mitchell is a 52 y.o. female with a history of ESRD on peritoneal dialysis and followed by Dr. Hinda Lenis who presented to the ER with leg pain and swelling and groin pain which became so great that she presented to the ER.  She reports that she last saw Dr. Hinda Lenis on 12/16.  The patient has states that she had gallbladder surgery recently.  She stopped the cycler for 4 days and did manual exchanges during that time and then by 12/18, she was back on the peritoneal dialysis machine without complications.  For the past couple of days, the patient has had an error message when the machine is trying to fill.  She states that she normally does 6 cycles of PD over 9 to 10 hours with fill volumes of 2 L.  She does have a last bag fill of extraneal which is 2 L.  No midday exchange.  The patient states that she has gotten more swollen over time; swelling is improved by laying supine.  She has been able to do manual exchanges.  She states that when she talked to the PD nurse that she was told to call the company and she has not called them yet.  She is just on manual exchanges.  She states that she does not typically urinate much and she did not have urine output in response to the Lasix in the ER.  She is on the torsemide and  normally urinates just a couple of times a day.  She denies any overt shortness of breath however has had a cough.  Of note, she states that her anemia is a chronic issue which they have had struggles with.  She has been seen at [facility] for transplant eval.  ER spoke with radiology who feel catheter in appropriate position.    Intake/Output Summary (Last 24 hours) at 03/26/2018 1612 Last data filed at 03/26/2018 1300 Gross per 24 hour  Intake 10789 ml  Output 100 ml  Net 75643 ml    Vitals:  Vitals:   03/25/18 2030 03/26/18 0505 03/26/18 0653 03/26/18 0808  BP: 135/73 111/65 125/67 134/69  Pulse: 70 78 79 78  Resp: (!) 21 18  19   Temp: 97.6 F (36.4 C) 98.7 F (37.1 C) 99.1 F (37.3 C) 98.8 F (37.1 C)  TempSrc: Oral Oral Oral Oral  SpO2: 96% (!) 89%  91%  Weight:   123.7 kg   Height:         Physical Exam:  General adult female in bed in no acute distress HEENT normocephalic atraumatic extraocular movements intact sclera anicteric Neck supple trachea midline Lungs clear to auscultation bilaterally normal work of breathing at rest  Heart regular rate and rhythm no rubs or  gallops appreciated Abdomen soft nontender nondistended Extremities trace lower extremity edema  Psych normal mood and affect  Medications reviewed   Labs:  BMP Latest Ref Rng & Units 03/26/2018 03/25/2018 03/24/2018  Glucose 70 - 99 mg/dL 122(H) 107(H) 140(H)  BUN 6 - 20 mg/dL 67(H) 72(H) 70(H)  Creatinine 0.44 - 1.00 mg/dL 15.46(H) 16.28(H) 16.08(H)  Sodium 135 - 145 mmol/L 134(L) 134(L) 133(L)  Potassium 3.5 - 5.1 mmol/L 4.4 4.4 4.2  Chloride 98 - 111 mmol/L 95(L) 93(L) 92(L)  CO2 22 - 32 mmol/L 24 24 24   Calcium 8.9 - 10.3 mg/dL 7.7(L) 7.8(L) 7.8(L)     Assessment/Plan:  # ESRD - PD tonight   # peritoneal dialysis complication - machine giving error messages and patient having to do manual drains at home   - Treatment here without issues  - Stable for discharge from a renal  standpoint  # Anemia  - Secondary to chronic disease - Would transfuse 1 unit PRBC's; stable for discharge after transfusion from a renal standpoint.  Ultimately appears to be likely discharge for tomorrow AM after PRBC's and PD overnight tonight - on ESA as outpatient per patient   # HTN  - Acceptable  Claudia Desanctis, MD 03/26/2018 4:12 PM

## 2018-03-26 NOTE — Care Management Note (Signed)
Case Management Note Manya Silvas, RN MSN CCM Transitions of Care 18M [place] [phone]  Patient Details  Name: Deshea Pooley MRN: [id number] Date of Birth: July 04, 1965  Subjective/Objective:      Fluid overload              Action/Plan: PTA home with spouse. Spouse provides transportation to medical appointments and picks up prescriptions from [place]. Reports that she could use some help with gas for transportation. Discussed calling social services for assistance. Pt expressed concerns about not being able to walk. Talked with pt RN about order for PT/OT consult. Pt stated that she was able to leave a message with someone at the dialysis center about getting replacement cycler. Will continue to follow for transition of care needs.   Expected Discharge Date:  03/29/18               Expected Discharge Plan:  Home/Self Care  In-House Referral:  NA  Discharge planning Services  CM Consult  Post Acute Care Choice:    Choice offered to:     DME Arranged:    DME Agency:     HH Arranged:    HH Agency:     Status of Service:  In process, will continue to follow  If discussed at Long Length of Stay Meetings, dates discussed:    Additional Comments:  Bartholomew Crews, RN 03/26/2018, 1:12 PM

## 2018-03-27 DIAGNOSIS — Z992 Dependence on renal dialysis: Secondary | ICD-10-CM | POA: Diagnosis not present

## 2018-03-27 DIAGNOSIS — Z794 Long term (current) use of insulin: Secondary | ICD-10-CM | POA: Diagnosis not present

## 2018-03-27 DIAGNOSIS — N186 End stage renal disease: Secondary | ICD-10-CM | POA: Diagnosis not present

## 2018-03-27 DIAGNOSIS — E1122 Type 2 diabetes mellitus with diabetic chronic kidney disease: Secondary | ICD-10-CM | POA: Diagnosis not present

## 2018-03-27 DIAGNOSIS — N185 Chronic kidney disease, stage 5: Secondary | ICD-10-CM | POA: Diagnosis not present

## 2018-03-27 DIAGNOSIS — D509 Iron deficiency anemia, unspecified: Secondary | ICD-10-CM | POA: Diagnosis not present

## 2018-03-27 DIAGNOSIS — E8770 Fluid overload, unspecified: Secondary | ICD-10-CM | POA: Diagnosis not present

## 2018-03-27 DIAGNOSIS — I10 Essential (primary) hypertension: Secondary | ICD-10-CM | POA: Diagnosis not present

## 2018-03-27 DIAGNOSIS — R600 Localized edema: Secondary | ICD-10-CM | POA: Diagnosis not present

## 2018-03-27 LAB — BPAM RBC
Blood Product Expiration Date: 202001242359
ISSUE DATE / TIME: 201912302122
Unit Type and Rh: 5100

## 2018-03-27 LAB — GLUCOSE, CAPILLARY
GLUCOSE-CAPILLARY: 114 mg/dL — AB (ref 70–99)
GLUCOSE-CAPILLARY: 110 mg/dL — AB (ref 70–99)

## 2018-03-27 LAB — TYPE AND SCREEN
ABO/RH(D): A POS
Antibody Screen: NEGATIVE
Donor AG Type: NEGATIVE
Unit division: 0

## 2018-03-27 LAB — CBC
HCT: 26.4 % — ABNORMAL LOW (ref 36.0–46.0)
Hemoglobin: 8.1 g/dL — ABNORMAL LOW (ref 12.0–15.0)
MCH: 24 pg — AB (ref 26.0–34.0)
MCHC: 30.7 g/dL (ref 30.0–36.0)
MCV: 78.3 fL — ABNORMAL LOW (ref 80.0–100.0)
Platelets: 200 10*3/uL (ref 150–400)
RBC: 3.37 MIL/uL — ABNORMAL LOW (ref 3.87–5.11)
RDW: 17.2 % — ABNORMAL HIGH (ref 11.5–15.5)
WBC: 6.6 10*3/uL (ref 4.0–10.5)
nRBC: 0 % (ref 0.0–0.2)

## 2018-03-27 NOTE — Evaluation (Signed)
Occupational Therapy Evaluation Patient Details Name: Janet Mitchell MRN: [id number] DOB: October 04, 1965 Today's Date: 03/27/2018    History of Present Illness Pt is a 52 y/o female with PMH of ESRD on PD, recent cholecystectomy, DM II, HTN, CHF. She presents after having difficulty performing PD for several days and was admitted with increasing LEE and volume overload.    Clinical Impression   PTA, pt was living with her husband and was performing ADLs with husband supervision for safety and comfort. Pt performing ADLs at Mod I level with increased time.Pt presenting with fatigue and decreased balance. Pt verbalizing frustrations with fatigue, bowel incontinence, pain at right hip, and husband not present.  Answered all pt questions in preparation for dc later today. Recommend dc home once medically stable per physician. All acute OT needs met and will sign off. Thank you.      Follow Up Recommendations  No OT follow up;Supervision/Assistance - 24 hour    Equipment Recommendations  None recommended by OT    Recommendations for Other Services       Precautions / Restrictions Precautions Precautions: Fall Restrictions Weight Bearing Restrictions: No      Mobility Bed Mobility               General bed mobility comments: In the bathroom upon arrival  Transfers Overall transfer level: Needs assistance Equipment used: None Transfers: Sit to/from Stand Sit to Stand: Modified independent (Device/Increase time)         General transfer comment: Increased time and effort    Balance Overall balance assessment: Needs assistance Sitting-balance support: Feet supported;No upper extremity supported Sitting balance-Leahy Scale: Good     Standing balance support: No upper extremity supported;Bilateral upper extremity supported Standing balance-Leahy Scale: Fair Standing balance comment: Maintaining static standing without UE support                            ADL either performed or assessed with clinical judgement   ADL Overall ADL's : Modified independent                                       General ADL Comments: Pt performing ADLs near baseline level at Mod I for increased time. Pt performing shower and donning clothing in preparation for dc later today. Mobilizing around room.      Vision         Perception     Praxis      Pertinent Vitals/Pain Pain Assessment: Faces Faces Pain Scale: Hurts even more Pain Location: R hip Pain Descriptors / Indicators: Discomfort;Grimacing Pain Intervention(s): Monitored during session;Limited activity within patient's tolerance;Repositioned     Hand Dominance Right   Extremity/Trunk Assessment Upper Extremity Assessment Upper Extremity Assessment: Generalized weakness   Lower Extremity Assessment Lower Extremity Assessment: Defer to PT evaluation RLE Deficits / Details: Painful RLE. Pt with 4+/5 strength in quads and hamstrings, and 2/5 strength in hip flexor. Pt not able to raise against gravity due to pain.  RLE: Unable to fully assess due to pain RLE Sensation: WNL RLE Coordination: WNL   Cervical / Trunk Assessment Cervical / Trunk Assessment: Normal   Communication Communication Communication: No difficulties   Cognition Arousal/Alertness: Awake/alert Behavior During Therapy: Anxious Overall Cognitive Status: Within Functional Limits for tasks assessed  General Comments: Feel pt is near baseline cognition. Pt pleasant and agreeable to therapy. However, pt presenting with changes in mood and decreased eye contact at times.    General Comments  BP 153/86. SpO2 96% on RA    Exercises     Shoulder Instructions      Home Living Family/patient expects to be discharged to:: Private residence Living Arrangements: Spouse/significant other;Other relatives Available Help at Discharge: Family;Available 24  hours/day Type of Home: House Home Access: Stairs to enter [employer] of Steps: 3 Entrance Stairs-Rails: Left("Homemade" and unsteady) Home Layout: One level     Bathroom Shower/Tub: Teacher, early years/pre: Standard     Home Equipment: Hand held shower head          Prior Functioning/Environment Level of Independence: Needs assistance  Gait / Transfers Assistance Needed: Pt does not use an AD however reports feeling unsteady often.  ADL's / Homemaking Assistance Needed: Husband provides supervision for showering and stepping in/out of tub.            OT Problem List: Decreased activity tolerance;Impaired balance (sitting and/or standing)      OT Treatment/Interventions:      OT Goals(Current goals can be found in the care plan section) Acute Rehab OT Goals Patient Stated Goal: "I don't want to lose my mobility" OT Goal Formulation: All assessment and education complete, DC therapy  OT Frequency:     Barriers to D/C:            Co-evaluation              AM-PAC OT "6 Clicks" Daily Activity     Outcome Measure Help from another person eating meals?: None Help from another person taking care of personal grooming?: None Help from another person toileting, which includes using toliet, bedpan, or urinal?: None Help from another person bathing (including washing, rinsing, drying)?: None Help from another person to put on and taking off regular upper body clothing?: None Help from another person to put on and taking off regular lower body clothing?: None 6 Click Score: 24   End of Session Equipment Utilized During Treatment: Gait belt;Rolling walker Nurse Communication: Mobility status  Activity Tolerance: Patient tolerated treatment well Patient left: in chair;with call bell/phone within reach  OT Visit Diagnosis: Unsteadiness on feet (R26.81);Other abnormalities of gait and mobility (R26.89);Muscle weakness (generalized) (M62.81)                 Time: 7026-3785 OT Time Calculation (min): 22 min Charges:  OT General Charges $OT Visit: 1 Visit OT Evaluation $OT Eval Low Complexity: [place], OTR/L Acute Rehab Pager: [phone] Office: [place] 03/27/2018, 10:57 AM

## 2018-03-27 NOTE — Progress Notes (Signed)
Pt given all discharge orders and verbalizes understanding.  Pt is aware of remaining meds that need to be taken tonight. All belongings are with pt.

## 2018-03-27 NOTE — Care Management Note (Signed)
Case Management Note Manya Silvas, RN MSN CCM Transitions of Care 2M [place] [phone]  Patient Details  Name: Janet Mitchell MRN: [id number] Date of Birth: 02/29/1966  Subjective/Objective:      Fluid overload              Action/Plan: PTA home with spouse. Spouse provides transportation to medical appointments and picks up prescriptions from [place]. Reports that she could use some help with gas for transportation. Discussed calling social services for assistance. Pt expressed concerns about not being able to walk. Talked with pt RN about order for PT/OT consult. Pt stated that she was able to leave a message with someone at the dialysis center about getting replacement cycler. Will continue to follow for transition of care needs.   Expected Discharge Date:  03/27/18               Expected Discharge Plan:  Home/Self Care  In-House Referral:  NA  Discharge planning Services  CM Consult  Post Acute Care Choice:  Home Health Choice offered to:  Patient  DME Arranged:  N/A DME Agency:  NA  HH Arranged:  PT HH Agency:  [place]  Status of Service:  Completed, signed off  If discussed at [place] of Stay Meetings, dates discussed:    Additional Comments: 03/27/2018 [place], RN MSN CCM PT recommended HHPT. Pt declined need for walker or 3:1. Received HH and face to face order from MD. Alvis Lemmings accepted referral. No other transition of care needs identified at this time.   Bartholomew Crews, RN 03/27/2018, 12:32 PM

## 2018-03-27 NOTE — Progress Notes (Signed)
Pt discharged home with husband.  Pt is aware of [facility] for PT. Arrangements have been made.

## 2018-03-28 DIAGNOSIS — N186 End stage renal disease: Secondary | ICD-10-CM | POA: Diagnosis not present

## 2018-03-28 DIAGNOSIS — N2581 Secondary hyperparathyroidism of renal origin: Secondary | ICD-10-CM | POA: Diagnosis not present

## 2018-03-28 DIAGNOSIS — Z23 Encounter for immunization: Secondary | ICD-10-CM | POA: Diagnosis not present

## 2018-03-28 DIAGNOSIS — Z992 Dependence on renal dialysis: Secondary | ICD-10-CM | POA: Diagnosis not present

## 2018-03-28 DIAGNOSIS — D509 Iron deficiency anemia, unspecified: Secondary | ICD-10-CM | POA: Diagnosis not present

## 2018-03-28 DIAGNOSIS — D631 Anemia in chronic kidney disease: Secondary | ICD-10-CM | POA: Diagnosis not present

## 2018-03-28 NOTE — Discharge Summary (Signed)
Physician Discharge Summary  [name] TDD:[id number] DOB: 1965/09/21 DOA: 03/24/2018  PCP: Lucianne Lei, MD  Admit date: 03/24/2018 Discharge date: 03/28/2018  Admitted From: home  Disposition:  home      Discharge Condition:  stable   CODE STATUS:  Full code   Diet recommendation:  Renal, heart healthy Consultations:  nephrology    Discharge Diagnoses:  Principal Problem:   Fluid overload Active Problems:   Hypertension   ESRD on peritoneal dialysis ([place])   Type II diabetes mellitus with renal manifestations ([place])   Microcytic anemia     Brief Summary: Janet Mitchell 53 y/o female with ESRD on peritoneal dialysis, HTN, DM 2 who has been having difficulty performing her PD for several days. She is admitted with increasing leg edema and volume overload.   Hospital Course:  Principal Problem:   Fluid overload- ESRD on peritoneal dialysis  - follows with Dr Hinda Lenis as outpt - likely has a malfunctioning machine at home- dialyzed in hospital until able to obtain new machine at home- her company will be able to provide her another by later today  Active Problems:   Hypertension - Amlodipine, Demadex, Cardura and Labetalol resumed   Anemia of chronic disease (due to CKD) - nephrology recommended 1 U PRBC prior to d/c which was transfused overnight- Hb has improved from 7.0 to 8.1 after transfusion       Type II diabetes mellitus with renal manifestations  - on Lantus and SSI- A1c 6.4   Recent cholecystectomy (12/13) - is recovering well    Discharge Exam: Vitals:   03/27/18 0927 03/27/18 1014  BP: 136/71 (!) 153/86  Pulse: 70 71  Resp: 18 19  Temp: 98 F (36.7 C) 97.6 F (36.4 C)  SpO2: 96% 96%   Vitals:   03/27/18 0100 03/27/18 0521 03/27/18 0927 03/27/18 1014  BP: (!) 145/73 136/73 136/71 (!) 153/86  Pulse: 72 70 70 71  Resp: 16 18 18 19   Temp: 98.1 F (36.7 C) 98 F (36.7 C) 98 F (36.7 C) 97.6 F (36.4 C)  TempSrc: Oral Oral Oral  Oral  SpO2: 97% 94% 96% 96%  Weight:   123.1 kg   Height:        General: Pt is alert, awake, not in acute distress Cardiovascular: RRR, S1/S2 +, no rubs, no gallops Respiratory: CTA bilaterally, no wheezing, no rhonchi Abdominal: Soft, NT, ND, bowel sounds + Extremities: no edema, no cyanosis   Discharge Instructions  Discharge Instructions    Diet - low sodium heart healthy   Complete by:  As directed    Increase activity slowly   Complete by:  As directed      Allergies as of 03/27/2018   No Known Allergies     Medication List    TAKE these medications   amLODipine 10 MG tablet Commonly known as:  NORVASC Take 1 tablet (10 mg total) by mouth daily.   aspirin EC 81 MG tablet Take 81 mg by mouth daily.   calcitRIOL 0.5 MCG capsule Commonly known as:  ROCALTROL Take 0.5-1 mcg by mouth See admin instructions. 0.66mcg on Mondays through Fridays and take 69mcg on Saturdays and Sundays   doxazosin 4 MG tablet Commonly known as:  CARDURA Take 4 mg by mouth at bedtime.   Insulin Glargine 300 UNIT/ML Sopn Commonly known as:  TOUJEO MAX SOLOSTAR Inject 14 Units into the skin at bedtime.   labetalol 200 MG tablet Commonly known as:  NORMODYNE Take 200 mg by  mouth 2 (two) times daily.   oxyCODONE 5 MG immediate release tablet Commonly known as:  Oxy IR/ROXICODONE Take 1 tablet (5 mg total) by mouth every 6 (six) hours as needed for severe pain.   torsemide 100 MG tablet Commonly known as:  DEMADEX Take 1 tablet (100 mg total) by mouth at bedtime.      Follow-up Information    [facility] Follow up.   Specialty:  Home Health Services Why:  physical therapy Contact information: [place] [address] [phone]          No Known Allergies   Procedures/Studies:    Ct Abdomen Pelvis Wo Contrast  Addendum Date: 03/25/2018   ADDENDUM REPORT: 03/25/2018 01:03 ADDENDUM: In the findings of the report should say a  dialysis catheter (instead of supra pubic catheter) is identified in the pelvis. Electronically Signed   By: Abelardo Diesel M.D.   On: 03/25/2018 01:03   Result Date: 03/25/2018 CLINICAL DATA:  Patient had gallbladder surgery on March 09, 2018. Patient complains of abdominal swelling since surgery. Patient is on dialysis. EXAM: CT ABDOMEN AND PELVIS WITHOUT CONTRAST TECHNIQUE: Multidetector CT imaging of the abdomen and pelvis was performed following the standard protocol without IV contrast. COMPARISON:  February 23, 2018 FINDINGS: Lower chest: Ground-glass and nodular densities are identified throughout the visualized bilateral lung bases. There is no pleural effusion. The heart size is upper limits of normal. Hepatobiliary: Noncontrast liver is normal. Patient status post prior cholecystectomy. The biliary tree is normal. Pancreas: Unremarkable. No pancreatic ductal dilatation or surrounding inflammatory changes. Spleen: Normal in size without focal abnormality. Adrenals/Urinary Tract: The adrenal glands are normal. The bilateral kidneys are atrophic. There is no hydronephrosis bilaterally. Fluid-filled bladder is normal. Stomach/Bowel: Stomach is within normal limits. Appendix appears normal. No evidence of bowel wall thickening, distention, or inflammatory changes. Vascular/Lymphatic: The aorta is normal in caliber. Atherosclerosis of branching vessels of the aorta are noted. There is no abdominal or pelvic lymphadenopathy. Reproductive: There probable uterine fibroids unchanged compared prior exam. Bilateral adnexa are normal. Other: Suprapubic catheter is identified. There is moderate ascites in the abdomen and pelvis. Musculoskeletal: Degenerative joint changes of the spine are noted. IMPRESSION: Status post prior cholecystectomy without evidence of complication in the gallbladder fossa. Moderate ascites. Ground-glass and nodular densities noted throughout bilateral lung bases. This is nonspecific but  can be seen in infectious/inflammatory etiology. Electronically Signed: By: Abelardo Diesel M.D. On: 03/25/2018 00:36   Dg Chest 2 View  Result Date: 03/24/2018 CLINICAL DATA:  Shortness of breath. EXAM: CHEST - 2 VIEW COMPARISON:  February 23, 2018 FINDINGS: The heart size and mediastinal contours are within normal limits. Increase pulmonary interstitium and hazy opacity are identified throughout both lungs. Nodularities are identified in the right lung. There is no pleural effusion. The visualized skeletal structures are stable. IMPRESSION: Increased pulmonary interstitium with hazy opacity in both lungs with nodularities identified in the right lung. The findings are nonspecific but can be due to infectious/inflammatory process. Underlying pulmonary edema is possible. Electronically Signed   By: Abelardo Diesel M.D.   On: 03/24/2018 20:37   Dg Cholangiogram Operative  Result Date: 03/09/2018 CLINICAL DATA:  Cholelithiasis EXAM: INTRAOPERATIVE CHOLANGIOGRAM TECHNIQUE: Cholangiographic images from the C-arm fluoroscopic device were submitted for interpretation post-operatively. Please see the procedural report for the amount of contrast and the fluoroscopy time utilized. COMPARISON:  None. FINDINGS: Contrast fills the biliary tree and duodenum without filling defects in the common  bile duct. IMPRESSION: Patent biliary tree. Electronically Signed   By: Marybelle Killings M.D.   On: 03/09/2018 14:54   Vas [place] Lower Extremity Venous (dvt)  Result Date: 03/27/2018  Lower Venous Study Indications: Edema.  Risk Factors: ESRD, recent cholecystectomy. Limitations: Edema. Comparison Study: No prior study Performing Technologist: Sharion Dove RVS  Examination Guidelines: A complete evaluation includes B-mode imaging, spectral Doppler, color Doppler, and power Doppler as needed of all accessible portions of each vessel. Bilateral testing is considered an integral part of a complete examination. Limited examinations for  reoccurring indications may be performed as noted.  Right Venous Findings: +---------+---------------+---------+-----------+----------+-------+          CompressibilityPhasicitySpontaneityPropertiesSummary +---------+---------------+---------+-----------+----------+-------+ CFV      Full           Yes      Yes                          +---------+---------------+---------+-----------+----------+-------+ SFJ      Full                                                 +---------+---------------+---------+-----------+----------+-------+ FV Prox  Full                                                 +---------+---------------+---------+-----------+----------+-------+ FV Mid   Full                                                 +---------+---------------+---------+-----------+----------+-------+ FV DistalFull                                                 +---------+---------------+---------+-----------+----------+-------+ PFV      Full                                                 +---------+---------------+---------+-----------+----------+-------+ POP      Full           Yes      Yes                          +---------+---------------+---------+-----------+----------+-------+ PTV      Full                                                 +---------+---------------+---------+-----------+----------+-------+  Right Technical Findings: Not visualized segments include peroneal.  Left Venous Findings: +---+---------------+---------+-----------+----------+-------+    CompressibilityPhasicitySpontaneityPropertiesSummary +---+---------------+---------+-----------+----------+-------+ CFVFull           Yes      Yes                          +---+---------------+---------+-----------+----------+-------+  Summary: Right: There is no evidence of deep vein thrombosis in the lower extremity. However, portions of this examination were limited- see technologist  comments above. Ultrasound characteristics of enlarged lymph nodes are noted in the groin. Left: There is no evidence of deep vein thrombosis in the lower extremity.  *See table(s) above for measurements and observations. Electronically signed by Deitra Mayo MD on 03/27/2018 at 6:39:16 AM.    Final       The results of significant diagnostics from this hospitalization (including imaging, microbiology, ancillary and laboratory) are listed below for reference.     Microbiology: No results found for this or any previous visit (from the past 240 hour(s)).   Labs: BNP (last 3 results) Recent Labs    02/23/18 2043  BNP 562.5*   Basic Metabolic Panel: Recent Labs  Lab 03/24/18 1946 03/25/18 0257 03/26/18 0542  NA 133* 134* 134*  K 4.2 4.4 4.4  CL 92* 93* 95*  CO2 24 24 24   GLUCOSE 140* 107* 122*  BUN 70* 72* 67*  CREATININE 16.08* 16.28* 15.46*  CALCIUM 7.8* 7.8* 7.7*  PHOS  --   --  8.3*   Liver Function Tests: Recent Labs  Lab 03/24/18 1946 03/26/18 0542  AST 12*  --   ALT <5  --   ALKPHOS 87  --   BILITOT 0.4  --   PROT 6.7  --   ALBUMIN 2.1* 1.9*   Recent Labs  Lab 03/24/18 1946  LIPASE 22   No results for input(s): AMMONIA in the last 168 hours. CBC: Recent Labs  Lab 03/24/18 1946 03/25/18 0257 03/26/18 0542 03/27/18 0813  WBC 8.9 8.0 6.7 6.6  HGB 7.3* 7.2* 7.0* 8.1*  HCT 24.1* 24.0* 23.3* 26.4*  MCV 79.8* 78.7* 78.2* 78.3*  PLT 173 177 186 200   Cardiac Enzymes: No results for input(s): CKTOTAL, CKMB, CKMBINDEX, TROPONINI in the last 168 hours. BNP: Invalid input(s): POCBNP CBG: Recent Labs  Lab 03/26/18 1102 03/26/18 1654 03/26/18 2112 03/27/18 0724 03/27/18 1127  GLUCAP 96 83 130* 114* 110*   D-Dimer No results for input(s): DDIMER in the last 72 hours. Hgb A1c No results for input(s): HGBA1C in the last 72 hours. Lipid Profile No results for input(s): CHOL, HDL, LDLCALC, TRIG, CHOLHDL, LDLDIRECT in the last 72  hours. Thyroid function studies No results for input(s): TSH, T4TOTAL, T3FREE, THYROIDAB in the last 72 hours.  Invalid input(s): FREET3 Anemia work up No results for input(s): VITAMINB12, FOLATE, FERRITIN, TIBC, IRON, RETICCTPCT in the last 72 hours. Urinalysis No results found for: COLORURINE, APPEARANCEUR, LABSPEC, [place], GLUCOSEU, HGBUR, BILIRUBINUR, KETONESUR, PROTEINUR, UROBILINOGEN, NITRITE, LEUKOCYTESUR Sepsis Labs Invalid input(s): PROCALCITONIN,  WBC,  LACTICIDVEN Microbiology No results found for this or any previous visit (from the past 240 hour(s)).   Time coordinating discharge in minutes: 60  SIGNED:   Debbe Odea, MD  [facility] 03/28/2018, 3:48 PM Pager   If 7PM-7AM, please contact night-coverage [email or web address] Password [email or web address]

## 2018-03-29 DIAGNOSIS — D631 Anemia in chronic kidney disease: Secondary | ICD-10-CM | POA: Diagnosis not present

## 2018-03-29 DIAGNOSIS — Z23 Encounter for immunization: Secondary | ICD-10-CM | POA: Diagnosis not present

## 2018-03-29 DIAGNOSIS — D509 Iron deficiency anemia, unspecified: Secondary | ICD-10-CM | POA: Diagnosis not present

## 2018-03-29 DIAGNOSIS — N186 End stage renal disease: Secondary | ICD-10-CM | POA: Diagnosis not present

## 2018-03-29 DIAGNOSIS — Z992 Dependence on renal dialysis: Secondary | ICD-10-CM | POA: Diagnosis not present

## 2018-03-29 DIAGNOSIS — N2581 Secondary hyperparathyroidism of renal origin: Secondary | ICD-10-CM | POA: Diagnosis not present

## 2018-03-30 DIAGNOSIS — Z992 Dependence on renal dialysis: Secondary | ICD-10-CM | POA: Diagnosis not present

## 2018-03-30 DIAGNOSIS — N186 End stage renal disease: Secondary | ICD-10-CM | POA: Diagnosis not present

## 2018-03-30 DIAGNOSIS — I110 Hypertensive heart disease with heart failure: Secondary | ICD-10-CM | POA: Diagnosis not present

## 2018-03-30 DIAGNOSIS — D631 Anemia in chronic kidney disease: Secondary | ICD-10-CM | POA: Diagnosis not present

## 2018-03-30 DIAGNOSIS — Z23 Encounter for immunization: Secondary | ICD-10-CM | POA: Diagnosis not present

## 2018-03-30 DIAGNOSIS — E1122 Type 2 diabetes mellitus with diabetic chronic kidney disease: Secondary | ICD-10-CM | POA: Diagnosis not present

## 2018-03-30 DIAGNOSIS — Z9049 Acquired absence of other specified parts of digestive tract: Secondary | ICD-10-CM | POA: Diagnosis not present

## 2018-03-30 DIAGNOSIS — D509 Iron deficiency anemia, unspecified: Secondary | ICD-10-CM | POA: Diagnosis not present

## 2018-03-30 DIAGNOSIS — Z96642 Presence of left artificial hip joint: Secondary | ICD-10-CM | POA: Diagnosis not present

## 2018-03-30 DIAGNOSIS — Z9181 History of falling: Secondary | ICD-10-CM | POA: Diagnosis not present

## 2018-03-30 DIAGNOSIS — I509 Heart failure, unspecified: Secondary | ICD-10-CM | POA: Diagnosis not present

## 2018-03-30 DIAGNOSIS — N2581 Secondary hyperparathyroidism of renal origin: Secondary | ICD-10-CM | POA: Diagnosis not present

## 2018-03-30 DIAGNOSIS — Z7982 Long term (current) use of aspirin: Secondary | ICD-10-CM | POA: Diagnosis not present

## 2018-03-30 DIAGNOSIS — Z794 Long term (current) use of insulin: Secondary | ICD-10-CM | POA: Diagnosis not present

## 2018-03-30 DIAGNOSIS — E1136 Type 2 diabetes mellitus with diabetic cataract: Secondary | ICD-10-CM | POA: Diagnosis not present

## 2018-03-31 DIAGNOSIS — N2581 Secondary hyperparathyroidism of renal origin: Secondary | ICD-10-CM | POA: Diagnosis not present

## 2018-03-31 DIAGNOSIS — Z992 Dependence on renal dialysis: Secondary | ICD-10-CM | POA: Diagnosis not present

## 2018-03-31 DIAGNOSIS — D509 Iron deficiency anemia, unspecified: Secondary | ICD-10-CM | POA: Diagnosis not present

## 2018-03-31 DIAGNOSIS — N186 End stage renal disease: Secondary | ICD-10-CM | POA: Diagnosis not present

## 2018-03-31 DIAGNOSIS — Z23 Encounter for immunization: Secondary | ICD-10-CM | POA: Diagnosis not present

## 2018-03-31 DIAGNOSIS — D631 Anemia in chronic kidney disease: Secondary | ICD-10-CM | POA: Diagnosis not present

## 2018-04-01 DIAGNOSIS — N2581 Secondary hyperparathyroidism of renal origin: Secondary | ICD-10-CM | POA: Diagnosis not present

## 2018-04-01 DIAGNOSIS — N186 End stage renal disease: Secondary | ICD-10-CM | POA: Diagnosis not present

## 2018-04-01 DIAGNOSIS — Z23 Encounter for immunization: Secondary | ICD-10-CM | POA: Diagnosis not present

## 2018-04-01 DIAGNOSIS — D631 Anemia in chronic kidney disease: Secondary | ICD-10-CM | POA: Diagnosis not present

## 2018-04-01 DIAGNOSIS — D509 Iron deficiency anemia, unspecified: Secondary | ICD-10-CM | POA: Diagnosis not present

## 2018-04-01 DIAGNOSIS — Z992 Dependence on renal dialysis: Secondary | ICD-10-CM | POA: Diagnosis not present

## 2018-04-02 DIAGNOSIS — N2581 Secondary hyperparathyroidism of renal origin: Secondary | ICD-10-CM | POA: Diagnosis not present

## 2018-04-02 DIAGNOSIS — D631 Anemia in chronic kidney disease: Secondary | ICD-10-CM | POA: Diagnosis not present

## 2018-04-02 DIAGNOSIS — N186 End stage renal disease: Secondary | ICD-10-CM | POA: Diagnosis not present

## 2018-04-02 DIAGNOSIS — D509 Iron deficiency anemia, unspecified: Secondary | ICD-10-CM | POA: Diagnosis not present

## 2018-04-02 DIAGNOSIS — Z992 Dependence on renal dialysis: Secondary | ICD-10-CM | POA: Diagnosis not present

## 2018-04-02 DIAGNOSIS — Z23 Encounter for immunization: Secondary | ICD-10-CM | POA: Diagnosis not present

## 2018-04-03 DIAGNOSIS — N2581 Secondary hyperparathyroidism of renal origin: Secondary | ICD-10-CM | POA: Diagnosis not present

## 2018-04-03 DIAGNOSIS — I509 Heart failure, unspecified: Secondary | ICD-10-CM | POA: Diagnosis not present

## 2018-04-03 DIAGNOSIS — N186 End stage renal disease: Secondary | ICD-10-CM | POA: Diagnosis not present

## 2018-04-03 DIAGNOSIS — E1122 Type 2 diabetes mellitus with diabetic chronic kidney disease: Secondary | ICD-10-CM | POA: Diagnosis not present

## 2018-04-03 DIAGNOSIS — D509 Iron deficiency anemia, unspecified: Secondary | ICD-10-CM | POA: Diagnosis not present

## 2018-04-03 DIAGNOSIS — E1136 Type 2 diabetes mellitus with diabetic cataract: Secondary | ICD-10-CM | POA: Diagnosis not present

## 2018-04-03 DIAGNOSIS — D631 Anemia in chronic kidney disease: Secondary | ICD-10-CM | POA: Diagnosis not present

## 2018-04-03 DIAGNOSIS — I110 Hypertensive heart disease with heart failure: Secondary | ICD-10-CM | POA: Diagnosis not present

## 2018-04-03 DIAGNOSIS — Z23 Encounter for immunization: Secondary | ICD-10-CM | POA: Diagnosis not present

## 2018-04-03 DIAGNOSIS — Z992 Dependence on renal dialysis: Secondary | ICD-10-CM | POA: Diagnosis not present

## 2018-04-04 DIAGNOSIS — N186 End stage renal disease: Secondary | ICD-10-CM | POA: Diagnosis not present

## 2018-04-04 DIAGNOSIS — Z23 Encounter for immunization: Secondary | ICD-10-CM | POA: Diagnosis not present

## 2018-04-04 DIAGNOSIS — Z992 Dependence on renal dialysis: Secondary | ICD-10-CM | POA: Diagnosis not present

## 2018-04-04 DIAGNOSIS — N2581 Secondary hyperparathyroidism of renal origin: Secondary | ICD-10-CM | POA: Diagnosis not present

## 2018-04-04 DIAGNOSIS — D631 Anemia in chronic kidney disease: Secondary | ICD-10-CM | POA: Diagnosis not present

## 2018-04-04 DIAGNOSIS — D509 Iron deficiency anemia, unspecified: Secondary | ICD-10-CM | POA: Diagnosis not present

## 2018-04-05 DIAGNOSIS — D631 Anemia in chronic kidney disease: Secondary | ICD-10-CM | POA: Diagnosis not present

## 2018-04-05 DIAGNOSIS — E1122 Type 2 diabetes mellitus with diabetic chronic kidney disease: Secondary | ICD-10-CM | POA: Diagnosis not present

## 2018-04-05 DIAGNOSIS — I509 Heart failure, unspecified: Secondary | ICD-10-CM | POA: Diagnosis not present

## 2018-04-05 DIAGNOSIS — D509 Iron deficiency anemia, unspecified: Secondary | ICD-10-CM | POA: Diagnosis not present

## 2018-04-05 DIAGNOSIS — N186 End stage renal disease: Secondary | ICD-10-CM | POA: Diagnosis not present

## 2018-04-05 DIAGNOSIS — I110 Hypertensive heart disease with heart failure: Secondary | ICD-10-CM | POA: Diagnosis not present

## 2018-04-05 DIAGNOSIS — E1136 Type 2 diabetes mellitus with diabetic cataract: Secondary | ICD-10-CM | POA: Diagnosis not present

## 2018-04-05 DIAGNOSIS — N2581 Secondary hyperparathyroidism of renal origin: Secondary | ICD-10-CM | POA: Diagnosis not present

## 2018-04-05 DIAGNOSIS — Z992 Dependence on renal dialysis: Secondary | ICD-10-CM | POA: Diagnosis not present

## 2018-04-05 DIAGNOSIS — Z23 Encounter for immunization: Secondary | ICD-10-CM | POA: Diagnosis not present

## 2018-04-06 DIAGNOSIS — Z992 Dependence on renal dialysis: Secondary | ICD-10-CM | POA: Diagnosis not present

## 2018-04-06 DIAGNOSIS — D509 Iron deficiency anemia, unspecified: Secondary | ICD-10-CM | POA: Diagnosis not present

## 2018-04-06 DIAGNOSIS — Z23 Encounter for immunization: Secondary | ICD-10-CM | POA: Diagnosis not present

## 2018-04-06 DIAGNOSIS — N186 End stage renal disease: Secondary | ICD-10-CM | POA: Diagnosis not present

## 2018-04-06 DIAGNOSIS — N2581 Secondary hyperparathyroidism of renal origin: Secondary | ICD-10-CM | POA: Diagnosis not present

## 2018-04-06 DIAGNOSIS — D631 Anemia in chronic kidney disease: Secondary | ICD-10-CM | POA: Diagnosis not present

## 2018-04-07 DIAGNOSIS — Z992 Dependence on renal dialysis: Secondary | ICD-10-CM | POA: Diagnosis not present

## 2018-04-07 DIAGNOSIS — N186 End stage renal disease: Secondary | ICD-10-CM | POA: Diagnosis not present

## 2018-04-07 DIAGNOSIS — D631 Anemia in chronic kidney disease: Secondary | ICD-10-CM | POA: Diagnosis not present

## 2018-04-07 DIAGNOSIS — D509 Iron deficiency anemia, unspecified: Secondary | ICD-10-CM | POA: Diagnosis not present

## 2018-04-07 DIAGNOSIS — Z23 Encounter for immunization: Secondary | ICD-10-CM | POA: Diagnosis not present

## 2018-04-07 DIAGNOSIS — N2581 Secondary hyperparathyroidism of renal origin: Secondary | ICD-10-CM | POA: Diagnosis not present

## 2018-04-08 DIAGNOSIS — N2581 Secondary hyperparathyroidism of renal origin: Secondary | ICD-10-CM | POA: Diagnosis not present

## 2018-04-08 DIAGNOSIS — N186 End stage renal disease: Secondary | ICD-10-CM | POA: Diagnosis not present

## 2018-04-08 DIAGNOSIS — Z23 Encounter for immunization: Secondary | ICD-10-CM | POA: Diagnosis not present

## 2018-04-08 DIAGNOSIS — D631 Anemia in chronic kidney disease: Secondary | ICD-10-CM | POA: Diagnosis not present

## 2018-04-08 DIAGNOSIS — D509 Iron deficiency anemia, unspecified: Secondary | ICD-10-CM | POA: Diagnosis not present

## 2018-04-08 DIAGNOSIS — Z992 Dependence on renal dialysis: Secondary | ICD-10-CM | POA: Diagnosis not present

## 2018-04-09 DIAGNOSIS — Z992 Dependence on renal dialysis: Secondary | ICD-10-CM | POA: Diagnosis not present

## 2018-04-09 DIAGNOSIS — D631 Anemia in chronic kidney disease: Secondary | ICD-10-CM | POA: Diagnosis not present

## 2018-04-09 DIAGNOSIS — D509 Iron deficiency anemia, unspecified: Secondary | ICD-10-CM | POA: Diagnosis not present

## 2018-04-09 DIAGNOSIS — N2581 Secondary hyperparathyroidism of renal origin: Secondary | ICD-10-CM | POA: Diagnosis not present

## 2018-04-09 DIAGNOSIS — N186 End stage renal disease: Secondary | ICD-10-CM | POA: Diagnosis not present

## 2018-04-09 DIAGNOSIS — Z23 Encounter for immunization: Secondary | ICD-10-CM | POA: Diagnosis not present

## 2018-04-10 DIAGNOSIS — E1136 Type 2 diabetes mellitus with diabetic cataract: Secondary | ICD-10-CM | POA: Diagnosis not present

## 2018-04-10 DIAGNOSIS — D509 Iron deficiency anemia, unspecified: Secondary | ICD-10-CM | POA: Diagnosis not present

## 2018-04-10 DIAGNOSIS — E1122 Type 2 diabetes mellitus with diabetic chronic kidney disease: Secondary | ICD-10-CM | POA: Diagnosis not present

## 2018-04-10 DIAGNOSIS — I110 Hypertensive heart disease with heart failure: Secondary | ICD-10-CM | POA: Diagnosis not present

## 2018-04-10 DIAGNOSIS — I509 Heart failure, unspecified: Secondary | ICD-10-CM | POA: Diagnosis not present

## 2018-04-10 DIAGNOSIS — N186 End stage renal disease: Secondary | ICD-10-CM | POA: Diagnosis not present

## 2018-04-10 DIAGNOSIS — Z992 Dependence on renal dialysis: Secondary | ICD-10-CM | POA: Diagnosis not present

## 2018-04-10 DIAGNOSIS — Z23 Encounter for immunization: Secondary | ICD-10-CM | POA: Diagnosis not present

## 2018-04-10 DIAGNOSIS — N2581 Secondary hyperparathyroidism of renal origin: Secondary | ICD-10-CM | POA: Diagnosis not present

## 2018-04-10 DIAGNOSIS — D631 Anemia in chronic kidney disease: Secondary | ICD-10-CM | POA: Diagnosis not present

## 2018-04-11 DIAGNOSIS — D509 Iron deficiency anemia, unspecified: Secondary | ICD-10-CM | POA: Diagnosis not present

## 2018-04-11 DIAGNOSIS — N2581 Secondary hyperparathyroidism of renal origin: Secondary | ICD-10-CM | POA: Diagnosis not present

## 2018-04-11 DIAGNOSIS — D631 Anemia in chronic kidney disease: Secondary | ICD-10-CM | POA: Diagnosis not present

## 2018-04-11 DIAGNOSIS — N186 End stage renal disease: Secondary | ICD-10-CM | POA: Diagnosis not present

## 2018-04-11 DIAGNOSIS — Z23 Encounter for immunization: Secondary | ICD-10-CM | POA: Diagnosis not present

## 2018-04-11 DIAGNOSIS — Z992 Dependence on renal dialysis: Secondary | ICD-10-CM | POA: Diagnosis not present

## 2018-04-12 DIAGNOSIS — D631 Anemia in chronic kidney disease: Secondary | ICD-10-CM | POA: Diagnosis not present

## 2018-04-12 DIAGNOSIS — H4313 Vitreous hemorrhage, bilateral: Secondary | ICD-10-CM | POA: Diagnosis not present

## 2018-04-12 DIAGNOSIS — N2581 Secondary hyperparathyroidism of renal origin: Secondary | ICD-10-CM | POA: Diagnosis not present

## 2018-04-12 DIAGNOSIS — D509 Iron deficiency anemia, unspecified: Secondary | ICD-10-CM | POA: Diagnosis not present

## 2018-04-12 DIAGNOSIS — N186 End stage renal disease: Secondary | ICD-10-CM | POA: Diagnosis not present

## 2018-04-12 DIAGNOSIS — Z992 Dependence on renal dialysis: Secondary | ICD-10-CM | POA: Diagnosis not present

## 2018-04-12 DIAGNOSIS — Z23 Encounter for immunization: Secondary | ICD-10-CM | POA: Diagnosis not present

## 2018-04-13 DIAGNOSIS — N2581 Secondary hyperparathyroidism of renal origin: Secondary | ICD-10-CM | POA: Diagnosis not present

## 2018-04-13 DIAGNOSIS — Z23 Encounter for immunization: Secondary | ICD-10-CM | POA: Diagnosis not present

## 2018-04-13 DIAGNOSIS — Z992 Dependence on renal dialysis: Secondary | ICD-10-CM | POA: Diagnosis not present

## 2018-04-13 DIAGNOSIS — E119 Type 2 diabetes mellitus without complications: Secondary | ICD-10-CM | POA: Diagnosis not present

## 2018-04-13 DIAGNOSIS — Z794 Long term (current) use of insulin: Secondary | ICD-10-CM | POA: Diagnosis not present

## 2018-04-13 DIAGNOSIS — D509 Iron deficiency anemia, unspecified: Secondary | ICD-10-CM | POA: Diagnosis not present

## 2018-04-13 DIAGNOSIS — D631 Anemia in chronic kidney disease: Secondary | ICD-10-CM | POA: Diagnosis not present

## 2018-04-13 DIAGNOSIS — N186 End stage renal disease: Secondary | ICD-10-CM | POA: Diagnosis not present

## 2018-04-14 DIAGNOSIS — D631 Anemia in chronic kidney disease: Secondary | ICD-10-CM | POA: Diagnosis not present

## 2018-04-14 DIAGNOSIS — Z992 Dependence on renal dialysis: Secondary | ICD-10-CM | POA: Diagnosis not present

## 2018-04-14 DIAGNOSIS — Z23 Encounter for immunization: Secondary | ICD-10-CM | POA: Diagnosis not present

## 2018-04-14 DIAGNOSIS — N2581 Secondary hyperparathyroidism of renal origin: Secondary | ICD-10-CM | POA: Diagnosis not present

## 2018-04-14 DIAGNOSIS — D509 Iron deficiency anemia, unspecified: Secondary | ICD-10-CM | POA: Diagnosis not present

## 2018-04-14 DIAGNOSIS — N186 End stage renal disease: Secondary | ICD-10-CM | POA: Diagnosis not present

## 2018-04-15 DIAGNOSIS — Z992 Dependence on renal dialysis: Secondary | ICD-10-CM | POA: Diagnosis not present

## 2018-04-15 DIAGNOSIS — N2581 Secondary hyperparathyroidism of renal origin: Secondary | ICD-10-CM | POA: Diagnosis not present

## 2018-04-15 DIAGNOSIS — Z23 Encounter for immunization: Secondary | ICD-10-CM | POA: Diagnosis not present

## 2018-04-15 DIAGNOSIS — N186 End stage renal disease: Secondary | ICD-10-CM | POA: Diagnosis not present

## 2018-04-15 DIAGNOSIS — D631 Anemia in chronic kidney disease: Secondary | ICD-10-CM | POA: Diagnosis not present

## 2018-04-15 DIAGNOSIS — D509 Iron deficiency anemia, unspecified: Secondary | ICD-10-CM | POA: Diagnosis not present

## 2018-04-16 DIAGNOSIS — Z992 Dependence on renal dialysis: Secondary | ICD-10-CM | POA: Diagnosis not present

## 2018-04-16 DIAGNOSIS — Z23 Encounter for immunization: Secondary | ICD-10-CM | POA: Diagnosis not present

## 2018-04-16 DIAGNOSIS — N186 End stage renal disease: Secondary | ICD-10-CM | POA: Diagnosis not present

## 2018-04-16 DIAGNOSIS — N2581 Secondary hyperparathyroidism of renal origin: Secondary | ICD-10-CM | POA: Diagnosis not present

## 2018-04-16 DIAGNOSIS — D509 Iron deficiency anemia, unspecified: Secondary | ICD-10-CM | POA: Diagnosis not present

## 2018-04-16 DIAGNOSIS — D631 Anemia in chronic kidney disease: Secondary | ICD-10-CM | POA: Diagnosis not present

## 2018-04-17 DIAGNOSIS — D509 Iron deficiency anemia, unspecified: Secondary | ICD-10-CM | POA: Diagnosis not present

## 2018-04-17 DIAGNOSIS — Z23 Encounter for immunization: Secondary | ICD-10-CM | POA: Diagnosis not present

## 2018-04-17 DIAGNOSIS — N2581 Secondary hyperparathyroidism of renal origin: Secondary | ICD-10-CM | POA: Diagnosis not present

## 2018-04-17 DIAGNOSIS — D631 Anemia in chronic kidney disease: Secondary | ICD-10-CM | POA: Diagnosis not present

## 2018-04-17 DIAGNOSIS — N186 End stage renal disease: Secondary | ICD-10-CM | POA: Diagnosis not present

## 2018-04-17 DIAGNOSIS — Z992 Dependence on renal dialysis: Secondary | ICD-10-CM | POA: Diagnosis not present

## 2018-04-18 DIAGNOSIS — Z992 Dependence on renal dialysis: Secondary | ICD-10-CM | POA: Diagnosis not present

## 2018-04-18 DIAGNOSIS — D631 Anemia in chronic kidney disease: Secondary | ICD-10-CM | POA: Diagnosis not present

## 2018-04-18 DIAGNOSIS — D509 Iron deficiency anemia, unspecified: Secondary | ICD-10-CM | POA: Diagnosis not present

## 2018-04-18 DIAGNOSIS — Z23 Encounter for immunization: Secondary | ICD-10-CM | POA: Diagnosis not present

## 2018-04-18 DIAGNOSIS — N2581 Secondary hyperparathyroidism of renal origin: Secondary | ICD-10-CM | POA: Diagnosis not present

## 2018-04-18 DIAGNOSIS — N186 End stage renal disease: Secondary | ICD-10-CM | POA: Diagnosis not present

## 2018-04-18 DIAGNOSIS — E669 Obesity, unspecified: Secondary | ICD-10-CM | POA: Diagnosis not present

## 2018-04-18 DIAGNOSIS — Z6830 Body mass index (BMI) 30.0-30.9, adult: Secondary | ICD-10-CM | POA: Diagnosis not present

## 2018-04-19 DIAGNOSIS — H25812 Combined forms of age-related cataract, left eye: Secondary | ICD-10-CM | POA: Diagnosis not present

## 2018-04-19 DIAGNOSIS — H2512 Age-related nuclear cataract, left eye: Secondary | ICD-10-CM | POA: Diagnosis not present

## 2018-04-19 DIAGNOSIS — Z23 Encounter for immunization: Secondary | ICD-10-CM | POA: Diagnosis not present

## 2018-04-19 DIAGNOSIS — N2581 Secondary hyperparathyroidism of renal origin: Secondary | ICD-10-CM | POA: Diagnosis not present

## 2018-04-19 DIAGNOSIS — N186 End stage renal disease: Secondary | ICD-10-CM | POA: Diagnosis not present

## 2018-04-19 DIAGNOSIS — Z992 Dependence on renal dialysis: Secondary | ICD-10-CM | POA: Diagnosis not present

## 2018-04-19 DIAGNOSIS — D631 Anemia in chronic kidney disease: Secondary | ICD-10-CM | POA: Diagnosis not present

## 2018-04-19 DIAGNOSIS — D509 Iron deficiency anemia, unspecified: Secondary | ICD-10-CM | POA: Diagnosis not present

## 2018-04-20 DIAGNOSIS — N2581 Secondary hyperparathyroidism of renal origin: Secondary | ICD-10-CM | POA: Diagnosis not present

## 2018-04-20 DIAGNOSIS — Z23 Encounter for immunization: Secondary | ICD-10-CM | POA: Diagnosis not present

## 2018-04-20 DIAGNOSIS — E1136 Type 2 diabetes mellitus with diabetic cataract: Secondary | ICD-10-CM | POA: Diagnosis not present

## 2018-04-20 DIAGNOSIS — D509 Iron deficiency anemia, unspecified: Secondary | ICD-10-CM | POA: Diagnosis not present

## 2018-04-20 DIAGNOSIS — N186 End stage renal disease: Secondary | ICD-10-CM | POA: Diagnosis not present

## 2018-04-20 DIAGNOSIS — I509 Heart failure, unspecified: Secondary | ICD-10-CM | POA: Diagnosis not present

## 2018-04-20 DIAGNOSIS — I110 Hypertensive heart disease with heart failure: Secondary | ICD-10-CM | POA: Diagnosis not present

## 2018-04-20 DIAGNOSIS — Z992 Dependence on renal dialysis: Secondary | ICD-10-CM | POA: Diagnosis not present

## 2018-04-20 DIAGNOSIS — E1122 Type 2 diabetes mellitus with diabetic chronic kidney disease: Secondary | ICD-10-CM | POA: Diagnosis not present

## 2018-04-20 DIAGNOSIS — D631 Anemia in chronic kidney disease: Secondary | ICD-10-CM | POA: Diagnosis not present

## 2018-04-21 DIAGNOSIS — D631 Anemia in chronic kidney disease: Secondary | ICD-10-CM | POA: Diagnosis not present

## 2018-04-21 DIAGNOSIS — D509 Iron deficiency anemia, unspecified: Secondary | ICD-10-CM | POA: Diagnosis not present

## 2018-04-21 DIAGNOSIS — Z992 Dependence on renal dialysis: Secondary | ICD-10-CM | POA: Diagnosis not present

## 2018-04-21 DIAGNOSIS — N2581 Secondary hyperparathyroidism of renal origin: Secondary | ICD-10-CM | POA: Diagnosis not present

## 2018-04-21 DIAGNOSIS — Z23 Encounter for immunization: Secondary | ICD-10-CM | POA: Diagnosis not present

## 2018-04-21 DIAGNOSIS — N186 End stage renal disease: Secondary | ICD-10-CM | POA: Diagnosis not present

## 2018-04-22 DIAGNOSIS — D631 Anemia in chronic kidney disease: Secondary | ICD-10-CM | POA: Diagnosis not present

## 2018-04-22 DIAGNOSIS — Z23 Encounter for immunization: Secondary | ICD-10-CM | POA: Diagnosis not present

## 2018-04-22 DIAGNOSIS — Z992 Dependence on renal dialysis: Secondary | ICD-10-CM | POA: Diagnosis not present

## 2018-04-22 DIAGNOSIS — N186 End stage renal disease: Secondary | ICD-10-CM | POA: Diagnosis not present

## 2018-04-22 DIAGNOSIS — N2581 Secondary hyperparathyroidism of renal origin: Secondary | ICD-10-CM | POA: Diagnosis not present

## 2018-04-22 DIAGNOSIS — D509 Iron deficiency anemia, unspecified: Secondary | ICD-10-CM | POA: Diagnosis not present

## 2018-04-23 DIAGNOSIS — N186 End stage renal disease: Secondary | ICD-10-CM | POA: Diagnosis not present

## 2018-04-23 DIAGNOSIS — N2581 Secondary hyperparathyroidism of renal origin: Secondary | ICD-10-CM | POA: Diagnosis not present

## 2018-04-23 DIAGNOSIS — Z992 Dependence on renal dialysis: Secondary | ICD-10-CM | POA: Diagnosis not present

## 2018-04-23 DIAGNOSIS — D631 Anemia in chronic kidney disease: Secondary | ICD-10-CM | POA: Diagnosis not present

## 2018-04-23 DIAGNOSIS — Z23 Encounter for immunization: Secondary | ICD-10-CM | POA: Diagnosis not present

## 2018-04-23 DIAGNOSIS — D509 Iron deficiency anemia, unspecified: Secondary | ICD-10-CM | POA: Diagnosis not present

## 2018-04-24 DIAGNOSIS — Z23 Encounter for immunization: Secondary | ICD-10-CM | POA: Diagnosis not present

## 2018-04-24 DIAGNOSIS — N2581 Secondary hyperparathyroidism of renal origin: Secondary | ICD-10-CM | POA: Diagnosis not present

## 2018-04-24 DIAGNOSIS — N186 End stage renal disease: Secondary | ICD-10-CM | POA: Diagnosis not present

## 2018-04-24 DIAGNOSIS — Z992 Dependence on renal dialysis: Secondary | ICD-10-CM | POA: Diagnosis not present

## 2018-04-24 DIAGNOSIS — D509 Iron deficiency anemia, unspecified: Secondary | ICD-10-CM | POA: Diagnosis not present

## 2018-04-24 DIAGNOSIS — D631 Anemia in chronic kidney disease: Secondary | ICD-10-CM | POA: Diagnosis not present

## 2018-04-25 DIAGNOSIS — N186 End stage renal disease: Secondary | ICD-10-CM | POA: Diagnosis not present

## 2018-04-25 DIAGNOSIS — D509 Iron deficiency anemia, unspecified: Secondary | ICD-10-CM | POA: Diagnosis not present

## 2018-04-25 DIAGNOSIS — Z992 Dependence on renal dialysis: Secondary | ICD-10-CM | POA: Diagnosis not present

## 2018-04-25 DIAGNOSIS — D631 Anemia in chronic kidney disease: Secondary | ICD-10-CM | POA: Diagnosis not present

## 2018-04-25 DIAGNOSIS — N2581 Secondary hyperparathyroidism of renal origin: Secondary | ICD-10-CM | POA: Diagnosis not present

## 2018-04-25 DIAGNOSIS — Z23 Encounter for immunization: Secondary | ICD-10-CM | POA: Diagnosis not present

## 2018-04-26 DIAGNOSIS — D631 Anemia in chronic kidney disease: Secondary | ICD-10-CM | POA: Diagnosis not present

## 2018-04-26 DIAGNOSIS — Z23 Encounter for immunization: Secondary | ICD-10-CM | POA: Diagnosis not present

## 2018-04-26 DIAGNOSIS — N186 End stage renal disease: Secondary | ICD-10-CM | POA: Diagnosis not present

## 2018-04-26 DIAGNOSIS — Z992 Dependence on renal dialysis: Secondary | ICD-10-CM | POA: Diagnosis not present

## 2018-04-26 DIAGNOSIS — N2581 Secondary hyperparathyroidism of renal origin: Secondary | ICD-10-CM | POA: Diagnosis not present

## 2018-04-26 DIAGNOSIS — D509 Iron deficiency anemia, unspecified: Secondary | ICD-10-CM | POA: Diagnosis not present

## 2018-04-27 DIAGNOSIS — Z992 Dependence on renal dialysis: Secondary | ICD-10-CM | POA: Diagnosis not present

## 2018-04-27 DIAGNOSIS — Z23 Encounter for immunization: Secondary | ICD-10-CM | POA: Diagnosis not present

## 2018-04-27 DIAGNOSIS — N186 End stage renal disease: Secondary | ICD-10-CM | POA: Diagnosis not present

## 2018-04-27 DIAGNOSIS — D631 Anemia in chronic kidney disease: Secondary | ICD-10-CM | POA: Diagnosis not present

## 2018-04-27 DIAGNOSIS — D509 Iron deficiency anemia, unspecified: Secondary | ICD-10-CM | POA: Diagnosis not present

## 2018-04-27 DIAGNOSIS — N2581 Secondary hyperparathyroidism of renal origin: Secondary | ICD-10-CM | POA: Diagnosis not present

## 2018-04-28 DIAGNOSIS — Z992 Dependence on renal dialysis: Secondary | ICD-10-CM | POA: Diagnosis not present

## 2018-04-28 DIAGNOSIS — D509 Iron deficiency anemia, unspecified: Secondary | ICD-10-CM | POA: Diagnosis not present

## 2018-04-28 DIAGNOSIS — N2581 Secondary hyperparathyroidism of renal origin: Secondary | ICD-10-CM | POA: Diagnosis not present

## 2018-04-28 DIAGNOSIS — D631 Anemia in chronic kidney disease: Secondary | ICD-10-CM | POA: Diagnosis not present

## 2018-04-28 DIAGNOSIS — N186 End stage renal disease: Secondary | ICD-10-CM | POA: Diagnosis not present

## 2018-04-29 DIAGNOSIS — N2581 Secondary hyperparathyroidism of renal origin: Secondary | ICD-10-CM | POA: Diagnosis not present

## 2018-04-29 DIAGNOSIS — N186 End stage renal disease: Secondary | ICD-10-CM | POA: Diagnosis not present

## 2018-04-29 DIAGNOSIS — D509 Iron deficiency anemia, unspecified: Secondary | ICD-10-CM | POA: Diagnosis not present

## 2018-04-29 DIAGNOSIS — Z992 Dependence on renal dialysis: Secondary | ICD-10-CM | POA: Diagnosis not present

## 2018-04-29 DIAGNOSIS — D631 Anemia in chronic kidney disease: Secondary | ICD-10-CM | POA: Diagnosis not present

## 2018-04-30 DIAGNOSIS — E113412 Type 2 diabetes mellitus with severe nonproliferative diabetic retinopathy with macular edema, left eye: Secondary | ICD-10-CM | POA: Diagnosis not present

## 2018-04-30 DIAGNOSIS — N2581 Secondary hyperparathyroidism of renal origin: Secondary | ICD-10-CM | POA: Diagnosis not present

## 2018-04-30 DIAGNOSIS — D631 Anemia in chronic kidney disease: Secondary | ICD-10-CM | POA: Diagnosis not present

## 2018-04-30 DIAGNOSIS — H4313 Vitreous hemorrhage, bilateral: Secondary | ICD-10-CM | POA: Diagnosis not present

## 2018-04-30 DIAGNOSIS — D509 Iron deficiency anemia, unspecified: Secondary | ICD-10-CM | POA: Diagnosis not present

## 2018-04-30 DIAGNOSIS — Z992 Dependence on renal dialysis: Secondary | ICD-10-CM | POA: Diagnosis not present

## 2018-04-30 DIAGNOSIS — N186 End stage renal disease: Secondary | ICD-10-CM | POA: Diagnosis not present

## 2018-04-30 DIAGNOSIS — E113411 Type 2 diabetes mellitus with severe nonproliferative diabetic retinopathy with macular edema, right eye: Secondary | ICD-10-CM | POA: Diagnosis not present

## 2018-05-01 DIAGNOSIS — Z992 Dependence on renal dialysis: Secondary | ICD-10-CM | POA: Diagnosis not present

## 2018-05-01 DIAGNOSIS — N2581 Secondary hyperparathyroidism of renal origin: Secondary | ICD-10-CM | POA: Diagnosis not present

## 2018-05-01 DIAGNOSIS — D631 Anemia in chronic kidney disease: Secondary | ICD-10-CM | POA: Diagnosis not present

## 2018-05-01 DIAGNOSIS — D509 Iron deficiency anemia, unspecified: Secondary | ICD-10-CM | POA: Diagnosis not present

## 2018-05-01 DIAGNOSIS — N186 End stage renal disease: Secondary | ICD-10-CM | POA: Diagnosis not present

## 2018-05-02 DIAGNOSIS — D631 Anemia in chronic kidney disease: Secondary | ICD-10-CM | POA: Diagnosis not present

## 2018-05-02 DIAGNOSIS — D509 Iron deficiency anemia, unspecified: Secondary | ICD-10-CM | POA: Diagnosis not present

## 2018-05-02 DIAGNOSIS — N2581 Secondary hyperparathyroidism of renal origin: Secondary | ICD-10-CM | POA: Diagnosis not present

## 2018-05-02 DIAGNOSIS — N186 End stage renal disease: Secondary | ICD-10-CM | POA: Diagnosis not present

## 2018-05-02 DIAGNOSIS — Z992 Dependence on renal dialysis: Secondary | ICD-10-CM | POA: Diagnosis not present

## 2018-05-03 DIAGNOSIS — N186 End stage renal disease: Secondary | ICD-10-CM | POA: Diagnosis not present

## 2018-05-03 DIAGNOSIS — D509 Iron deficiency anemia, unspecified: Secondary | ICD-10-CM | POA: Diagnosis not present

## 2018-05-03 DIAGNOSIS — N2581 Secondary hyperparathyroidism of renal origin: Secondary | ICD-10-CM | POA: Diagnosis not present

## 2018-05-03 DIAGNOSIS — Z992 Dependence on renal dialysis: Secondary | ICD-10-CM | POA: Diagnosis not present

## 2018-05-03 DIAGNOSIS — D631 Anemia in chronic kidney disease: Secondary | ICD-10-CM | POA: Diagnosis not present

## 2018-05-04 DIAGNOSIS — Z992 Dependence on renal dialysis: Secondary | ICD-10-CM | POA: Diagnosis not present

## 2018-05-04 DIAGNOSIS — N186 End stage renal disease: Secondary | ICD-10-CM | POA: Diagnosis not present

## 2018-05-04 DIAGNOSIS — D509 Iron deficiency anemia, unspecified: Secondary | ICD-10-CM | POA: Diagnosis not present

## 2018-05-04 DIAGNOSIS — D631 Anemia in chronic kidney disease: Secondary | ICD-10-CM | POA: Diagnosis not present

## 2018-05-04 DIAGNOSIS — N2581 Secondary hyperparathyroidism of renal origin: Secondary | ICD-10-CM | POA: Diagnosis not present

## 2018-05-05 DIAGNOSIS — Z992 Dependence on renal dialysis: Secondary | ICD-10-CM | POA: Diagnosis not present

## 2018-05-05 DIAGNOSIS — N186 End stage renal disease: Secondary | ICD-10-CM | POA: Diagnosis not present

## 2018-05-05 DIAGNOSIS — N2581 Secondary hyperparathyroidism of renal origin: Secondary | ICD-10-CM | POA: Diagnosis not present

## 2018-05-05 DIAGNOSIS — D631 Anemia in chronic kidney disease: Secondary | ICD-10-CM | POA: Diagnosis not present

## 2018-05-05 DIAGNOSIS — D509 Iron deficiency anemia, unspecified: Secondary | ICD-10-CM | POA: Diagnosis not present

## 2018-05-06 DIAGNOSIS — Z992 Dependence on renal dialysis: Secondary | ICD-10-CM | POA: Diagnosis not present

## 2018-05-06 DIAGNOSIS — D509 Iron deficiency anemia, unspecified: Secondary | ICD-10-CM | POA: Diagnosis not present

## 2018-05-06 DIAGNOSIS — N2581 Secondary hyperparathyroidism of renal origin: Secondary | ICD-10-CM | POA: Diagnosis not present

## 2018-05-06 DIAGNOSIS — D631 Anemia in chronic kidney disease: Secondary | ICD-10-CM | POA: Diagnosis not present

## 2018-05-06 DIAGNOSIS — N186 End stage renal disease: Secondary | ICD-10-CM | POA: Diagnosis not present

## 2018-05-07 DIAGNOSIS — N186 End stage renal disease: Secondary | ICD-10-CM | POA: Diagnosis not present

## 2018-05-07 DIAGNOSIS — D509 Iron deficiency anemia, unspecified: Secondary | ICD-10-CM | POA: Diagnosis not present

## 2018-05-07 DIAGNOSIS — D631 Anemia in chronic kidney disease: Secondary | ICD-10-CM | POA: Diagnosis not present

## 2018-05-07 DIAGNOSIS — Z992 Dependence on renal dialysis: Secondary | ICD-10-CM | POA: Diagnosis not present

## 2018-05-07 DIAGNOSIS — N2581 Secondary hyperparathyroidism of renal origin: Secondary | ICD-10-CM | POA: Diagnosis not present

## 2018-05-08 ENCOUNTER — Other Ambulatory Visit: Payer: Self-pay

## 2018-05-08 ENCOUNTER — Other Ambulatory Visit (HOSPITAL_COMMUNITY)
Admission: RE | Admit: 2018-05-08 | Discharge: 2018-05-08 | Disposition: A | Discharge status: Home / Self Care | Payer: Medicare Other | Source: Ambulatory Visit | Attending: Obstetrics & Gynecology | Admitting: Obstetrics & Gynecology

## 2018-05-08 ENCOUNTER — Encounter: Payer: Self-pay | Admitting: Women's Health

## 2018-05-08 ENCOUNTER — Ambulatory Visit (INDEPENDENT_AMBULATORY_CARE_PROVIDER_SITE_OTHER): Payer: Medicare Other | Admitting: Women's Health

## 2018-05-08 VITALS — BP 118/66 | HR 82 | Ht 68.0 in | Wt 264.0 lb

## 2018-05-08 DIAGNOSIS — N921 Excessive and frequent menstruation with irregular cycle: Secondary | ICD-10-CM

## 2018-05-08 DIAGNOSIS — N186 End stage renal disease: Secondary | ICD-10-CM | POA: Diagnosis not present

## 2018-05-08 DIAGNOSIS — Z124 Encounter for screening for malignant neoplasm of cervix: Secondary | ICD-10-CM

## 2018-05-08 DIAGNOSIS — Z992 Dependence on renal dialysis: Secondary | ICD-10-CM | POA: Diagnosis not present

## 2018-05-08 DIAGNOSIS — N2581 Secondary hyperparathyroidism of renal origin: Secondary | ICD-10-CM | POA: Diagnosis not present

## 2018-05-08 DIAGNOSIS — N8110 Cystocele, unspecified: Secondary | ICD-10-CM | POA: Diagnosis not present

## 2018-05-08 DIAGNOSIS — D509 Iron deficiency anemia, unspecified: Secondary | ICD-10-CM | POA: Diagnosis not present

## 2018-05-08 DIAGNOSIS — N816 Rectocele: Secondary | ICD-10-CM

## 2018-05-08 DIAGNOSIS — Z01419 Encounter for gynecological examination (general) (routine) without abnormal findings: Secondary | ICD-10-CM

## 2018-05-08 DIAGNOSIS — D631 Anemia in chronic kidney disease: Secondary | ICD-10-CM | POA: Diagnosis not present
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
## 2018-05-09 ENCOUNTER — Telehealth: Payer: Self-pay | Admitting: *Deleted

## 2018-05-09 ENCOUNTER — Other Ambulatory Visit: Payer: Self-pay | Admitting: *Deleted

## 2018-05-09 DIAGNOSIS — N2581 Secondary hyperparathyroidism of renal origin: Secondary | ICD-10-CM | POA: Diagnosis not present

## 2018-05-09 DIAGNOSIS — N186 End stage renal disease: Secondary | ICD-10-CM | POA: Diagnosis not present

## 2018-05-09 DIAGNOSIS — D509 Iron deficiency anemia, unspecified: Secondary | ICD-10-CM | POA: Diagnosis not present

## 2018-05-09 DIAGNOSIS — Z992 Dependence on renal dialysis: Secondary | ICD-10-CM | POA: Diagnosis not present

## 2018-05-09 DIAGNOSIS — E113413 Type 2 diabetes mellitus with severe nonproliferative diabetic retinopathy with macular edema, bilateral: Secondary | ICD-10-CM | POA: Diagnosis not present

## 2018-05-09 DIAGNOSIS — D631 Anemia in chronic kidney disease: Secondary | ICD-10-CM | POA: Diagnosis not present

## 2018-05-09 DIAGNOSIS — I10 Essential (primary) hypertension: Secondary | ICD-10-CM | POA: Diagnosis not present

## 2018-05-09 DIAGNOSIS — N921 Excessive and frequent menstruation with irregular cycle: Secondary | ICD-10-CM

## 2018-05-09 NOTE — Telephone Encounter (Signed)
Erroneous encounter

## 2018-05-10 DIAGNOSIS — D509 Iron deficiency anemia, unspecified: Secondary | ICD-10-CM | POA: Diagnosis not present

## 2018-05-10 DIAGNOSIS — D631 Anemia in chronic kidney disease: Secondary | ICD-10-CM | POA: Diagnosis not present

## 2018-05-10 DIAGNOSIS — Z992 Dependence on renal dialysis: Secondary | ICD-10-CM | POA: Diagnosis not present

## 2018-05-10 DIAGNOSIS — N186 End stage renal disease: Secondary | ICD-10-CM | POA: Diagnosis not present

## 2018-05-10 DIAGNOSIS — N2581 Secondary hyperparathyroidism of renal origin: Secondary | ICD-10-CM | POA: Diagnosis not present

## 2018-05-11 DIAGNOSIS — D631 Anemia in chronic kidney disease: Secondary | ICD-10-CM | POA: Diagnosis not present

## 2018-05-11 DIAGNOSIS — D509 Iron deficiency anemia, unspecified: Secondary | ICD-10-CM | POA: Diagnosis not present

## 2018-05-11 DIAGNOSIS — Z992 Dependence on renal dialysis: Secondary | ICD-10-CM | POA: Diagnosis not present

## 2018-05-11 DIAGNOSIS — E113412 Type 2 diabetes mellitus with severe nonproliferative diabetic retinopathy with macular edema, left eye: Secondary | ICD-10-CM | POA: Diagnosis not present

## 2018-05-11 DIAGNOSIS — H4313 Vitreous hemorrhage, bilateral: Secondary | ICD-10-CM | POA: Diagnosis not present

## 2018-05-11 DIAGNOSIS — N2581 Secondary hyperparathyroidism of renal origin: Secondary | ICD-10-CM | POA: Diagnosis not present

## 2018-05-11 DIAGNOSIS — E113413 Type 2 diabetes mellitus with severe nonproliferative diabetic retinopathy with macular edema, bilateral: Secondary | ICD-10-CM | POA: Diagnosis not present

## 2018-05-11 DIAGNOSIS — N186 End stage renal disease: Secondary | ICD-10-CM | POA: Diagnosis not present

## 2018-05-11 LAB — CYTOLOGY - PAP
CHLAMYDIA, DNA PROBE: NEGATIVE
Diagnosis: NEGATIVE
HPV: NOT DETECTED
Neisseria Gonorrhea: NEGATIVE

## 2018-05-12 DIAGNOSIS — D631 Anemia in chronic kidney disease: Secondary | ICD-10-CM | POA: Diagnosis not present

## 2018-05-12 DIAGNOSIS — N2581 Secondary hyperparathyroidism of renal origin: Secondary | ICD-10-CM | POA: Diagnosis not present

## 2018-05-12 DIAGNOSIS — N186 End stage renal disease: Secondary | ICD-10-CM | POA: Diagnosis not present

## 2018-05-12 DIAGNOSIS — Z992 Dependence on renal dialysis: Secondary | ICD-10-CM | POA: Diagnosis not present

## 2018-05-12 DIAGNOSIS — D509 Iron deficiency anemia, unspecified: Secondary | ICD-10-CM | POA: Diagnosis not present

## 2018-05-13 DIAGNOSIS — Z992 Dependence on renal dialysis: Secondary | ICD-10-CM | POA: Diagnosis not present

## 2018-05-13 DIAGNOSIS — N2581 Secondary hyperparathyroidism of renal origin: Secondary | ICD-10-CM | POA: Diagnosis not present

## 2018-05-13 DIAGNOSIS — D631 Anemia in chronic kidney disease: Secondary | ICD-10-CM | POA: Diagnosis not present

## 2018-05-13 DIAGNOSIS — D509 Iron deficiency anemia, unspecified: Secondary | ICD-10-CM | POA: Diagnosis not present

## 2018-05-13 DIAGNOSIS — N186 End stage renal disease: Secondary | ICD-10-CM | POA: Diagnosis not present

## 2018-05-14 ENCOUNTER — Ambulatory Visit (HOSPITAL_COMMUNITY)
Admission: RE | Admit: 2018-05-14 | Discharge: 2018-05-14 | Disposition: A | Discharge status: Home / Self Care | Payer: Medicare Other | Source: Ambulatory Visit | Attending: Pediatrics | Admitting: Pediatrics

## 2018-05-14 DIAGNOSIS — N2581 Secondary hyperparathyroidism of renal origin: Secondary | ICD-10-CM | POA: Diagnosis not present

## 2018-05-14 DIAGNOSIS — Z992 Dependence on renal dialysis: Secondary | ICD-10-CM | POA: Diagnosis not present

## 2018-05-14 DIAGNOSIS — D509 Iron deficiency anemia, unspecified: Secondary | ICD-10-CM | POA: Diagnosis not present

## 2018-05-14 DIAGNOSIS — D631 Anemia in chronic kidney disease: Secondary | ICD-10-CM | POA: Diagnosis not present

## 2018-05-14 DIAGNOSIS — N921 Excessive and frequent menstruation with irregular cycle: Secondary | ICD-10-CM | POA: Insufficient documentation

## 2018-05-14 DIAGNOSIS — N186 End stage renal disease: Secondary | ICD-10-CM | POA: Diagnosis not present

## 2018-05-15 DIAGNOSIS — D631 Anemia in chronic kidney disease: Secondary | ICD-10-CM | POA: Diagnosis not present

## 2018-05-15 DIAGNOSIS — Z992 Dependence on renal dialysis: Secondary | ICD-10-CM | POA: Diagnosis not present

## 2018-05-15 DIAGNOSIS — N186 End stage renal disease: Secondary | ICD-10-CM | POA: Diagnosis not present

## 2018-05-15 DIAGNOSIS — D509 Iron deficiency anemia, unspecified: Secondary | ICD-10-CM | POA: Diagnosis not present

## 2018-05-15 DIAGNOSIS — N2581 Secondary hyperparathyroidism of renal origin: Secondary | ICD-10-CM | POA: Diagnosis not present

## 2018-05-15 DIAGNOSIS — H4312 Vitreous hemorrhage, left eye: Secondary | ICD-10-CM | POA: Diagnosis not present

## 2018-05-15 HISTORY — PX: EYE SURGERY: SHX253

## 2018-05-16 DIAGNOSIS — N186 End stage renal disease: Secondary | ICD-10-CM | POA: Diagnosis not present

## 2018-05-16 DIAGNOSIS — Z992 Dependence on renal dialysis: Secondary | ICD-10-CM | POA: Diagnosis not present

## 2018-05-16 DIAGNOSIS — D509 Iron deficiency anemia, unspecified: Secondary | ICD-10-CM | POA: Diagnosis not present

## 2018-05-16 DIAGNOSIS — D631 Anemia in chronic kidney disease: Secondary | ICD-10-CM | POA: Diagnosis not present

## 2018-05-16 DIAGNOSIS — N2581 Secondary hyperparathyroidism of renal origin: Secondary | ICD-10-CM | POA: Diagnosis not present

## 2018-05-17 DIAGNOSIS — Z992 Dependence on renal dialysis: Secondary | ICD-10-CM | POA: Diagnosis not present

## 2018-05-17 DIAGNOSIS — D631 Anemia in chronic kidney disease: Secondary | ICD-10-CM | POA: Diagnosis not present

## 2018-05-17 DIAGNOSIS — N2581 Secondary hyperparathyroidism of renal origin: Secondary | ICD-10-CM | POA: Diagnosis not present

## 2018-05-17 DIAGNOSIS — D509 Iron deficiency anemia, unspecified: Secondary | ICD-10-CM | POA: Diagnosis not present

## 2018-05-17 DIAGNOSIS — N186 End stage renal disease: Secondary | ICD-10-CM | POA: Diagnosis not present

## 2018-05-18 DIAGNOSIS — Z992 Dependence on renal dialysis: Secondary | ICD-10-CM | POA: Diagnosis not present

## 2018-05-18 DIAGNOSIS — D509 Iron deficiency anemia, unspecified: Secondary | ICD-10-CM | POA: Diagnosis not present

## 2018-05-18 DIAGNOSIS — D631 Anemia in chronic kidney disease: Secondary | ICD-10-CM | POA: Diagnosis not present

## 2018-05-18 DIAGNOSIS — N2581 Secondary hyperparathyroidism of renal origin: Secondary | ICD-10-CM | POA: Diagnosis not present

## 2018-05-18 DIAGNOSIS — N186 End stage renal disease: Secondary | ICD-10-CM | POA: Diagnosis not present

## 2018-05-19 DIAGNOSIS — N186 End stage renal disease: Secondary | ICD-10-CM | POA: Diagnosis not present

## 2018-05-19 DIAGNOSIS — N2581 Secondary hyperparathyroidism of renal origin: Secondary | ICD-10-CM | POA: Diagnosis not present

## 2018-05-19 DIAGNOSIS — D509 Iron deficiency anemia, unspecified: Secondary | ICD-10-CM | POA: Diagnosis not present

## 2018-05-19 DIAGNOSIS — D631 Anemia in chronic kidney disease: Secondary | ICD-10-CM | POA: Diagnosis not present

## 2018-05-19 DIAGNOSIS — Z992 Dependence on renal dialysis: Secondary | ICD-10-CM | POA: Diagnosis not present

## 2018-05-20 DIAGNOSIS — Z992 Dependence on renal dialysis: Secondary | ICD-10-CM | POA: Diagnosis not present

## 2018-05-20 DIAGNOSIS — D631 Anemia in chronic kidney disease: Secondary | ICD-10-CM | POA: Diagnosis not present

## 2018-05-20 DIAGNOSIS — D509 Iron deficiency anemia, unspecified: Secondary | ICD-10-CM | POA: Diagnosis not present

## 2018-05-20 DIAGNOSIS — N2581 Secondary hyperparathyroidism of renal origin: Secondary | ICD-10-CM | POA: Diagnosis not present

## 2018-05-20 DIAGNOSIS — N186 End stage renal disease: Secondary | ICD-10-CM | POA: Diagnosis not present

## 2018-05-21 ENCOUNTER — Ambulatory Visit: Payer: Medicare Other | Admitting: Gastroenterology

## 2018-05-21 DIAGNOSIS — N186 End stage renal disease: Secondary | ICD-10-CM | POA: Diagnosis not present

## 2018-05-21 DIAGNOSIS — D509 Iron deficiency anemia, unspecified: Secondary | ICD-10-CM | POA: Diagnosis not present

## 2018-05-21 DIAGNOSIS — D631 Anemia in chronic kidney disease: Secondary | ICD-10-CM | POA: Diagnosis not present

## 2018-05-21 DIAGNOSIS — N2581 Secondary hyperparathyroidism of renal origin: Secondary | ICD-10-CM | POA: Diagnosis not present

## 2018-05-21 DIAGNOSIS — Z992 Dependence on renal dialysis: Secondary | ICD-10-CM | POA: Diagnosis not present

## 2018-05-22 ENCOUNTER — Ambulatory Visit (INDEPENDENT_AMBULATORY_CARE_PROVIDER_SITE_OTHER): Payer: Medicare Other | Admitting: Nurse Practitioner

## 2018-05-22 ENCOUNTER — Encounter: Payer: Self-pay | Admitting: *Deleted

## 2018-05-22 ENCOUNTER — Ambulatory Visit (INDEPENDENT_AMBULATORY_CARE_PROVIDER_SITE_OTHER): Payer: Medicare Other | Admitting: Obstetrics and Gynecology

## 2018-05-22 ENCOUNTER — Encounter: Payer: Self-pay | Admitting: Obstetrics and Gynecology

## 2018-05-22 ENCOUNTER — Other Ambulatory Visit: Payer: Self-pay | Admitting: *Deleted

## 2018-05-22 ENCOUNTER — Encounter: Payer: Self-pay | Admitting: Nurse Practitioner

## 2018-05-22 VITALS — BP 166/86 | HR 73 | Ht 68.0 in | Wt 258.4 lb

## 2018-05-22 DIAGNOSIS — D649 Anemia, unspecified: Secondary | ICD-10-CM

## 2018-05-22 DIAGNOSIS — Z992 Dependence on renal dialysis: Secondary | ICD-10-CM | POA: Diagnosis not present

## 2018-05-22 DIAGNOSIS — R197 Diarrhea, unspecified: Secondary | ICD-10-CM | POA: Diagnosis not present

## 2018-05-22 DIAGNOSIS — D631 Anemia in chronic kidney disease: Secondary | ICD-10-CM | POA: Diagnosis not present

## 2018-05-22 DIAGNOSIS — N186 End stage renal disease: Secondary | ICD-10-CM | POA: Diagnosis not present

## 2018-05-22 DIAGNOSIS — N920 Excessive and frequent menstruation with regular cycle: Secondary | ICD-10-CM

## 2018-05-22 DIAGNOSIS — Z1211 Encounter for screening for malignant neoplasm of colon: Secondary | ICD-10-CM

## 2018-05-22 DIAGNOSIS — N2581 Secondary hyperparathyroidism of renal origin: Secondary | ICD-10-CM | POA: Diagnosis not present

## 2018-05-22 DIAGNOSIS — D509 Iron deficiency anemia, unspecified: Secondary | ICD-10-CM | POA: Diagnosis not present

## 2018-05-22 MED ORDER — PEG 3350-KCL-NA BICARB-NACL 420 G PO SOLR: 4000.0000 mL | Freq: Once | ORAL | 0 refills | Status: AC

## 2018-05-22 NOTE — Progress Notes (Addendum)
Patient ID: Janet Mitchell, female   DOB: 04/09/1965, 53 y.o.   MRN: [id number]    [facility] Visit  @DATE @            Patient name: Janet Mitchell MRN [id number]  Date of birth: 01-12-1966  CC & HPI:  Janet Mitchell is a 53 y.o. female presenting today for f/u of [facility] TV u/s. She is accompanied by her husband Janet Mitchell. Has had issue with continous bleeding, tubes aren't tied and is severely anemic (around 5-7). LMP 2/7-11/2018 light bleeding,which is surprising. Both Sheena and  Husband says when she bleeds lightly during one month the next month's period is severe and has two periods in one month.  She's now does peritoneal dialysis and is trying to get on kidney transplant list. Is stilll sexually active with no BC in place. Has been having unprotected sex for past 22 years and hadn't gotten pregnant.  TV u/s:  FINDINGS: Uterus  Measurements: 11.2 x 5.4 x 5.4 cm = volume: 171 mL. Uterus is poorly visualized on transabdominal imaging due to poor acoustic window/decompressed bladder. Uterine fundus suboptimally visualized on transvaginal imaging due to bowel and distance from vagina. No gross evidence of mass on limited assessment.  Endometrium  Thickness: Questionably visualized at 17 mm thick. Poorly seen, suboptimally assessed. Endocervical fluid noted.  Right ovary  Measurements: 3.3 x 13.7 x 4.0 cm = volume: 25.0 mL. Suboptimally visualized due to bowel in adnexa. Probable dominant follicle without mass.  Left ovary Measurements: 3.2 x 4.6 x 3.3 cm = volume: 24.7 mL. Normal morphology without mass.  Other findings Free pelvic fluid question related to peritoneal dialysis. No gross adnexal masses.   ROS:  ROS +thickened endometrium +anemia -fever  All systems are negative except as noted in the HPI and PMH.   Pertinent History Reviewed:   Reviewed:  Medical         Past Medical History:  Diagnosis Date   Anemia    CHF (congestive  heart failure) ([place])    Chronic cholecystitis with calculus    ESRD (end stage renal disease) on dialysis ([place])    "peritoneal dialysis q hs" (03/09/2018)   Headache    "a few/wk" (03/09/2018)   History of blood transfusion 10/2017   "low blood count" (03/09/2018)   Hypertension    Spinal headache    Type II diabetes mellitus ([place])                               Surgical Hx:    Past Surgical History:  Procedure Laterality Date   AMPUTATION TOE Left 2013   Great toe   CATARACT EXTRACTION W/ INTRAOCULAR LENS IMPLANT Right    CESAREAN SECTION  1994; 1998   CHOLECYSTECTOMY N/A 03/09/2018   Procedure: LAPAROSCOPIC CHOLECYSTECTOMY WITH INTRAOPERATIVE CHOLANGIOGRAM ERAS PATHWAY;  Surgeon: Donnie Mesa, MD;  Location: [facility];  Service: General;  Laterality: N/A;   EYE SURGERY  05/15/2018   Removal of blood in the globe (due to DM)   FLEXIBLE SIGMOIDOSCOPY N/A 11/24/2017   Procedure: FLEXIBLE SIGMOIDOSCOPY;  Surgeon: Daneil Dolin, MD;  [facility];  Service: Endoscopy;  Laterality: N/A;   JOINT REPLACEMENT     LAPAROSCOPIC CHOLECYSTECTOMY  03/09/2018   PERITONEAL CATHETER INSERTION  2017   TOTAL HIP ARTHROPLASTY Left 1997   Medications: Reviewed & Updated - see associated section  Current Outpatient Medications:    ALPRAZolam (XANAX) 0.5 MG tablet, Take 0.5 mg by mouth at bedtime as needed for anxiety., Disp: , Rfl:    amLODipine (NORVASC) 10 MG tablet, Take 1 tablet (10 mg total) by mouth daily., Disp: 30 tablet, Rfl: 3   aspirin EC 81 MG tablet, Take 81 mg by mouth daily. , Disp: , Rfl:    calcitRIOL (ROCALTROL) 0.5 MCG capsule, Take 0.5-1 mcg by mouth See admin instructions. 0.37mcg on Mondays through Fridays and take 97mcg on Saturdays and Sundays, Disp: , Rfl:    doxazosin (CARDURA) 4 MG tablet, Take 4 mg by mouth at bedtime., Disp: , Rfl:    Insulin Glargine (TOUJEO MAX SOLOSTAR) 300 UNIT/ML SOPN, Inject 14 Units into the skin  at bedtime., Disp: 3 pen, Rfl: 3   labetalol (NORMODYNE) 200 MG tablet, Take 200 mg by mouth 2 (two) times daily., Disp: , Rfl:    polyethylene glycol-electrolytes (NULYTELY/GOLYTELY) 420 g solution, Take 4,000 mLs by mouth once for 1 dose., Disp: 4000 mL, Rfl: 0   torsemide (DEMADEX) 100 MG tablet, Take 1 tablet (100 mg total) by mouth at bedtime., Disp: 30 tablet, Rfl: 3   Social History: Reviewed -  reports that she is a non-smoker but has been exposed to tobacco smoke. She has never used smokeless tobacco.  Objective Findings:  Vitals: Last menstrual period 05/04/2018.  PHYSICAL EXAMINATION General appearance - alert, well appearing, and in no distress and playful, active, laughing Mental status - alert, oriented to person, place, and time, normal mood, behavior, speech, dress, motor activity, and thought processes, affect appropriate to mood  PELVIC NOT DONE  Assessment & Plan:   A:  1. Anemia 2. Menorrhagia 3. Thickened endometrium  P:  1. ENDO BX, 1 week 2. Investigate menorrhagia tx options. 3. Review literature of peritoneal dialysis, tubes not tied    By signing my name below, I, Samul Dada, attest that this documentation has been prepared under the direction and in the presence of Jonnie Kind, MD. Electronically Signed: [place]. 05/22/18. 3:10 PM.  I personally performed the services described in this documentation, which was SCRIBED in my presence. The recorded information has been reviewed and considered accurate. It has been edited as necessary during review. Jonnie Kind, MD

## 2018-05-22 NOTE — Assessment & Plan Note (Signed)
The patient previously had constipation.  She is currently struggling with diarrhea after her laparoscopic cholecystectomy in December.  Hopefully this will resolve as it often does.  In the meantime this should help with her colonoscopy prep overall.  If she is not any better at her follow-up in 3 months (5 to 6 months since surgery) we can consider medications such as cholestyramine, Bentyl, others.  We will proceed with rescheduling her colonoscopy at this time.  Proceed with TCS with Dr. Gala Romney in near future: the risks, benefits, and alternatives have been discussed with the patient in detail. The patient states understanding and desires to proceed.  The patient has on Xanax 0.5 mg at bedtime as needed.  No other anticoagulants, anxiolytics, chronic pain medications, or antidepressants.  Her recently attempted colonoscopy was completed on minimal conscious sedation.  I feel given the "as needed" status of her Xanax conscious sedation should likely be adequate for procedure.

## 2018-05-22 NOTE — Progress Notes (Signed)
cc'ed to pcp °

## 2018-05-22 NOTE — Progress Notes (Signed)
Referring Provider: Lucianne Lei, MD Primary Care Physician:  Lucianne Lei, MD Primary GI:  Dr. Gala Romney  Chief Complaint  Patient presents with  . Colonoscopy    poor prep  . Diarrhea    since gb removed 02/2018    HPI:   Janet Mitchell is a 53 y.o. female who presents to schedule repeat colonoscopy due to previous colonoscopy with a poor prep.  Last colonoscopy completed 11/24/2017 which found inadequate preparation of the colon and subsequently colonoscopy not completed.  There is noted to be viscous, semi-formed stool in the rectum traveling into the sigmoid colon.  Recommended repeat colonoscopy at the next available appointment for screening purposes.  Today she states she's doing ok overall. She had her gallbladder removed in 02/2018 as elective surgery. Has frequent diarrhea, no further constipation since cholecystectomy. Some nausea, no further vomiting since cholecystectomy. Intermittent abdominal discomfort with certain food triggers. Denies hematochezia, melena, fever, chills, unintentional weight loss. On peritoneal dialysis. Denies chest pain, dyspnea, dizziness, lightheadedness, syncope, near syncope. Denies any other upper or lower GI symptoms.  She is hypertensive today, but she just took her BP medication prior to her office visit. They are working on possibility of kidney transplant, but she needs to lose 50 lbs; considering bariatric surgery to help.  Past Medical History:  Diagnosis Date  . Anemia   . CHF (congestive heart failure) ([place])   . Chronic cholecystitis with calculus   . ESRD (end stage renal disease) on dialysis [facility])    "peritoneal dialysis q hs" (03/09/2018)  . Headache    "a few/wk" (03/09/2018)  . History of blood transfusion 10/2017   "low blood count" (03/09/2018)  . Hypertension   . Spinal headache   . Type II diabetes mellitus ([place])     Past Surgical History:  Procedure Laterality Date  . AMPUTATION TOE Left 2013   Great toe  .  CATARACT EXTRACTION W/ INTRAOCULAR LENS IMPLANT Right   . CESAREAN SECTION  1994; 1998  . CHOLECYSTECTOMY N/A 03/09/2018   Procedure: LAPAROSCOPIC CHOLECYSTECTOMY WITH INTRAOPERATIVE CHOLANGIOGRAM ERAS PATHWAY;  Surgeon: Donnie Mesa, MD;  Location: [facility];  Service: General;  Laterality: N/A;  . EYE SURGERY  05/15/2018   Removal of blood in the globe (due to DM)  . FLEXIBLE SIGMOIDOSCOPY N/A 11/24/2017   Procedure: FLEXIBLE SIGMOIDOSCOPY;  Surgeon: Daneil Dolin, MD;  [facility];  Service: Endoscopy;  Laterality: N/A;  . JOINT REPLACEMENT    . LAPAROSCOPIC CHOLECYSTECTOMY  03/09/2018  . PERITONEAL CATHETER INSERTION  2017  . TOTAL HIP ARTHROPLASTY Left 1997    Current Outpatient Medications  Medication Sig Dispense Refill  . ALPRAZolam (XANAX) 0.5 MG tablet Take 0.5 mg by mouth at bedtime as needed for anxiety.    Marland Kitchen amLODipine (NORVASC) 10 MG tablet Take 1 tablet (10 mg total) by mouth daily. 30 tablet 3  . aspirin EC 81 MG tablet Take 81 mg by mouth daily.     . calcitRIOL (ROCALTROL) 0.5 MCG capsule Take 0.5-1 mcg by mouth See admin instructions. 0.15mcg on Mondays through Fridays and take 31mcg on Saturdays and Sundays    . doxazosin (CARDURA) 4 MG tablet Take 4 mg by mouth at bedtime.    . Insulin Glargine (TOUJEO MAX SOLOSTAR) 300 UNIT/ML SOPN Inject 14 Units into the skin at bedtime. 3 pen 3  . labetalol (NORMODYNE) 200 MG tablet Take 200 mg by mouth 2 (two) times daily.    Marland Kitchen torsemide (DEMADEX) 100 MG  tablet Take 1 tablet (100 mg total) by mouth at bedtime. 30 tablet 3   No current facility-administered medications for this visit.     Allergies as of 05/22/2018  . (No Known Allergies)    Family History  Problem Relation Age of Onset  . Heart disease Mother   . Thrombocytopenia Mother        TTP  . Heart failure Father   . Kidney disease Paternal Grandfather   . Colon cancer Neg Hx     Social History   Socioeconomic History  . Marital status: Married      Spouse name: Not on file  . Number of children: Not on file  . Years of education: Not on file  . Highest education level: Not on file  Occupational History  . Not on file  Social Needs  . Financial resource strain: Not on file  . Food insecurity:    Worry: Not on file    Inability: Not on file  . Transportation needs:    Medical: Not on file    Non-medical: Not on file  Tobacco Use  . Smoking status: Passive Smoke Exposure - Never Smoker  . Smokeless tobacco: Never Used  Substance and Sexual Activity  . Alcohol use: Never    Frequency: Never  . Drug use: Never  . Sexual activity: Yes    Birth control/protection: None  Lifestyle  . Physical activity:    Days per week: Not on file    Minutes per session: Not on file  . Stress: Not on file  Relationships  . Social connections:    Talks on phone: Not on file    Gets together: Not on file    Attends religious service: Not on file    Active member of club or organization: Not on file    Attends meetings of clubs or organizations: Not on file    Relationship status: Not on file  Other Topics Concern  . Not on file  Social History Narrative  . Not on file    Review of Systems: General: Negative for anorexia, weight loss, fever, chills, fatigue, weakness. ENT: Negative for hoarseness, difficulty swallowing. CV: Negative for chest pain, angina, palpitations, peripheral edema.  Respiratory: Negative for dyspnea at rest, cough, sputum, wheezing.  GI: See history of present illness. Endo: Negative for unusual weight change.  Heme: Negative for bruising or bleeding. Allergy: Negative for rash or hives.   Physical Exam: BP (!) 183/93   Pulse 85   Temp 97.6 F (36.4 C) (Oral)   Ht 5\' 8"  (1.727 m)   Wt 258 lb 9.6 oz (117.3 kg)   LMP 05/04/2018   BMI 39.32 kg/m  General:   Alert and oriented. Pleasant and cooperative. Well-nourished and well-developed.  Eyes:  Without icterus, sclera clear and conjunctiva pink.   Ears:  Normal auditory acuity. Cardiovascular:  S1, S2 present without murmurs appreciated. Extremities without clubbing or edema. Respiratory:  Clear to auscultation bilaterally. No wheezes, rales, or rhonchi. No distress.  Gastrointestinal:  +BS, soft, non-tender and non-distended. No HSM noted. No guarding or rebound. No masses appreciated.  Rectal:  Deferred  Musculoskalatal:  Symmetrical without gross deformities.  Skin:  Intact without significant lesions or rashes. PD catheter noted. Several small, wel-healed laproscopy scars noted anterior abdomen. Neurologic:  Alert and oriented x4;  grossly normal neurologically. Psych:  Alert and cooperative. Normal mood and affect. Heme/Lymph/Immune: No excessive bruising noted.    05/22/2018 8:43 AM   Disclaimer: This  note was dictated with voice recognition software. Similar sounding words can inadvertently be transcribed and may not be corrected upon review.

## 2018-05-22 NOTE — Patient Instructions (Signed)
Your health issues we discussed today were:   Need for colonoscopy: 1. We will reschedule your colonoscopy 2. We will plan for 2 days of clear liquids to help have a good prep to be able to complete the procedure 3. Further recommendations will be made after colonoscopy  Diarrhea: 1. Diarrhea is common after having her gallbladder out 2. Hopefully it will resolve on its own, as is typical, within 6 months after surgery 3. When we see you back if you are still having diarrhea we can try medications to help  Overall I recommend:  1. Follow-up in 3 months 2. Call us if you have any questions or concerns.  At [facility] we value your feedback. You may receive a survey about your visit today. Please share your experience as we strive to create trusting relationships with our patients to provide genuine, compassionate, quality care.  We appreciate your understanding and patience as we review any laboratory studies, imaging, and other diagnostic tests that are ordered as we care for you. Our office policy is 5 business days for review of these results, and any emergent or urgent results are addressed in a timely manner for your best interest. If you do not hear from our office in 1 week, please contact us.   We also encourage the use of MyChart, which contains your medical information for your review as well. If you are not enrolled in this feature, an access code is on this after visit summary for your convenience. Thank you for allowing [place] to be involved in your care.  It was great to see you today!  I hope you have a great day!!

## 2018-05-23 DIAGNOSIS — N186 End stage renal disease: Secondary | ICD-10-CM | POA: Diagnosis not present

## 2018-05-23 DIAGNOSIS — N2581 Secondary hyperparathyroidism of renal origin: Secondary | ICD-10-CM | POA: Diagnosis not present

## 2018-05-23 DIAGNOSIS — D509 Iron deficiency anemia, unspecified: Secondary | ICD-10-CM | POA: Diagnosis not present

## 2018-05-23 DIAGNOSIS — Z0000 Encounter for general adult medical examination without abnormal findings: Secondary | ICD-10-CM | POA: Diagnosis not present

## 2018-05-23 DIAGNOSIS — D631 Anemia in chronic kidney disease: Secondary | ICD-10-CM | POA: Diagnosis not present

## 2018-05-23 DIAGNOSIS — Z992 Dependence on renal dialysis: Secondary | ICD-10-CM | POA: Diagnosis not present

## 2018-05-24 DIAGNOSIS — D509 Iron deficiency anemia, unspecified: Secondary | ICD-10-CM | POA: Diagnosis not present

## 2018-05-24 DIAGNOSIS — Z992 Dependence on renal dialysis: Secondary | ICD-10-CM | POA: Diagnosis not present

## 2018-05-24 DIAGNOSIS — N186 End stage renal disease: Secondary | ICD-10-CM | POA: Diagnosis not present

## 2018-05-24 DIAGNOSIS — D631 Anemia in chronic kidney disease: Secondary | ICD-10-CM | POA: Diagnosis not present

## 2018-05-24 DIAGNOSIS — N2581 Secondary hyperparathyroidism of renal origin: Secondary | ICD-10-CM | POA: Diagnosis not present

## 2018-05-25 DIAGNOSIS — N2581 Secondary hyperparathyroidism of renal origin: Secondary | ICD-10-CM | POA: Diagnosis not present

## 2018-05-25 DIAGNOSIS — E113412 Type 2 diabetes mellitus with severe nonproliferative diabetic retinopathy with macular edema, left eye: Secondary | ICD-10-CM | POA: Diagnosis not present

## 2018-05-25 DIAGNOSIS — N186 End stage renal disease: Secondary | ICD-10-CM | POA: Diagnosis not present

## 2018-05-25 DIAGNOSIS — D509 Iron deficiency anemia, unspecified: Secondary | ICD-10-CM | POA: Diagnosis not present

## 2018-05-25 DIAGNOSIS — Z992 Dependence on renal dialysis: Secondary | ICD-10-CM | POA: Diagnosis not present

## 2018-05-25 DIAGNOSIS — D631 Anemia in chronic kidney disease: Secondary | ICD-10-CM | POA: Diagnosis not present

## 2018-05-26 DIAGNOSIS — N2581 Secondary hyperparathyroidism of renal origin: Secondary | ICD-10-CM | POA: Diagnosis not present

## 2018-05-26 DIAGNOSIS — N186 End stage renal disease: Secondary | ICD-10-CM | POA: Diagnosis not present

## 2018-05-26 DIAGNOSIS — Z992 Dependence on renal dialysis: Secondary | ICD-10-CM | POA: Diagnosis not present

## 2018-05-26 DIAGNOSIS — D631 Anemia in chronic kidney disease: Secondary | ICD-10-CM | POA: Diagnosis not present

## 2018-05-26 DIAGNOSIS — D509 Iron deficiency anemia, unspecified: Secondary | ICD-10-CM | POA: Diagnosis not present

## 2018-05-27 DIAGNOSIS — N186 End stage renal disease: Secondary | ICD-10-CM | POA: Diagnosis not present

## 2018-05-27 DIAGNOSIS — D509 Iron deficiency anemia, unspecified: Secondary | ICD-10-CM | POA: Diagnosis not present

## 2018-05-27 DIAGNOSIS — D631 Anemia in chronic kidney disease: Secondary | ICD-10-CM | POA: Diagnosis not present

## 2018-05-27 DIAGNOSIS — Z992 Dependence on renal dialysis: Secondary | ICD-10-CM | POA: Diagnosis not present

## 2018-05-28 DIAGNOSIS — N186 End stage renal disease: Secondary | ICD-10-CM | POA: Diagnosis not present

## 2018-05-28 DIAGNOSIS — D509 Iron deficiency anemia, unspecified: Secondary | ICD-10-CM | POA: Diagnosis not present

## 2018-05-28 DIAGNOSIS — Z992 Dependence on renal dialysis: Secondary | ICD-10-CM | POA: Diagnosis not present

## 2018-05-28 DIAGNOSIS — D631 Anemia in chronic kidney disease: Secondary | ICD-10-CM | POA: Diagnosis not present

## 2018-05-29 DIAGNOSIS — N186 End stage renal disease: Secondary | ICD-10-CM | POA: Diagnosis not present

## 2018-05-29 DIAGNOSIS — Z992 Dependence on renal dialysis: Secondary | ICD-10-CM | POA: Diagnosis not present

## 2018-05-29 DIAGNOSIS — D631 Anemia in chronic kidney disease: Secondary | ICD-10-CM | POA: Diagnosis not present

## 2018-05-29 DIAGNOSIS — D509 Iron deficiency anemia, unspecified: Secondary | ICD-10-CM | POA: Diagnosis not present

## 2018-05-30 ENCOUNTER — Other Ambulatory Visit: Payer: Self-pay | Admitting: Obstetrics and Gynecology

## 2018-05-30 DIAGNOSIS — Z992 Dependence on renal dialysis: Secondary | ICD-10-CM | POA: Diagnosis not present

## 2018-05-30 DIAGNOSIS — N186 End stage renal disease: Secondary | ICD-10-CM | POA: Diagnosis not present

## 2018-05-30 DIAGNOSIS — D509 Iron deficiency anemia, unspecified: Secondary | ICD-10-CM | POA: Diagnosis not present

## 2018-05-30 DIAGNOSIS — D631 Anemia in chronic kidney disease: Secondary | ICD-10-CM | POA: Diagnosis not present

## 2018-05-31 DIAGNOSIS — Z992 Dependence on renal dialysis: Secondary | ICD-10-CM | POA: Diagnosis not present

## 2018-05-31 DIAGNOSIS — D509 Iron deficiency anemia, unspecified: Secondary | ICD-10-CM | POA: Diagnosis not present

## 2018-05-31 DIAGNOSIS — N186 End stage renal disease: Secondary | ICD-10-CM | POA: Diagnosis not present

## 2018-05-31 DIAGNOSIS — D631 Anemia in chronic kidney disease: Secondary | ICD-10-CM | POA: Diagnosis not present

## 2018-06-01 DIAGNOSIS — D631 Anemia in chronic kidney disease: Secondary | ICD-10-CM | POA: Diagnosis not present

## 2018-06-01 DIAGNOSIS — Z992 Dependence on renal dialysis: Secondary | ICD-10-CM | POA: Diagnosis not present

## 2018-06-01 DIAGNOSIS — D509 Iron deficiency anemia, unspecified: Secondary | ICD-10-CM | POA: Diagnosis not present

## 2018-06-01 DIAGNOSIS — N186 End stage renal disease: Secondary | ICD-10-CM | POA: Diagnosis not present

## 2018-06-02 DIAGNOSIS — Z992 Dependence on renal dialysis: Secondary | ICD-10-CM | POA: Diagnosis not present

## 2018-06-02 DIAGNOSIS — D509 Iron deficiency anemia, unspecified: Secondary | ICD-10-CM | POA: Diagnosis not present

## 2018-06-02 DIAGNOSIS — N186 End stage renal disease: Secondary | ICD-10-CM | POA: Diagnosis not present

## 2018-06-02 DIAGNOSIS — D631 Anemia in chronic kidney disease: Secondary | ICD-10-CM | POA: Diagnosis not present

## 2018-06-03 DIAGNOSIS — Z992 Dependence on renal dialysis: Secondary | ICD-10-CM | POA: Diagnosis not present

## 2018-06-03 DIAGNOSIS — D509 Iron deficiency anemia, unspecified: Secondary | ICD-10-CM | POA: Diagnosis not present

## 2018-06-03 DIAGNOSIS — N186 End stage renal disease: Secondary | ICD-10-CM | POA: Diagnosis not present

## 2018-06-03 DIAGNOSIS — D631 Anemia in chronic kidney disease: Secondary | ICD-10-CM | POA: Diagnosis not present

## 2018-06-04 DIAGNOSIS — D509 Iron deficiency anemia, unspecified: Secondary | ICD-10-CM | POA: Diagnosis not present

## 2018-06-04 DIAGNOSIS — D631 Anemia in chronic kidney disease: Secondary | ICD-10-CM | POA: Diagnosis not present

## 2018-06-04 DIAGNOSIS — N186 End stage renal disease: Secondary | ICD-10-CM | POA: Diagnosis not present

## 2018-06-04 DIAGNOSIS — Z992 Dependence on renal dialysis: Secondary | ICD-10-CM | POA: Diagnosis not present

## 2018-06-05 DIAGNOSIS — D509 Iron deficiency anemia, unspecified: Secondary | ICD-10-CM | POA: Diagnosis not present

## 2018-06-05 DIAGNOSIS — D631 Anemia in chronic kidney disease: Secondary | ICD-10-CM | POA: Diagnosis not present

## 2018-06-05 DIAGNOSIS — N186 End stage renal disease: Secondary | ICD-10-CM | POA: Diagnosis not present

## 2018-06-05 DIAGNOSIS — Z992 Dependence on renal dialysis: Secondary | ICD-10-CM | POA: Diagnosis not present

## 2018-06-06 DIAGNOSIS — Z992 Dependence on renal dialysis: Secondary | ICD-10-CM | POA: Diagnosis not present

## 2018-06-06 DIAGNOSIS — D631 Anemia in chronic kidney disease: Secondary | ICD-10-CM | POA: Diagnosis not present

## 2018-06-06 DIAGNOSIS — N186 End stage renal disease: Secondary | ICD-10-CM | POA: Diagnosis not present

## 2018-06-06 DIAGNOSIS — D509 Iron deficiency anemia, unspecified: Secondary | ICD-10-CM | POA: Diagnosis not present

## 2018-06-07 DIAGNOSIS — Z992 Dependence on renal dialysis: Secondary | ICD-10-CM | POA: Diagnosis not present

## 2018-06-07 DIAGNOSIS — N186 End stage renal disease: Secondary | ICD-10-CM | POA: Diagnosis not present

## 2018-06-07 DIAGNOSIS — D631 Anemia in chronic kidney disease: Secondary | ICD-10-CM | POA: Diagnosis not present

## 2018-06-07 DIAGNOSIS — D509 Iron deficiency anemia, unspecified: Secondary | ICD-10-CM | POA: Diagnosis not present

## 2018-06-08 DIAGNOSIS — D631 Anemia in chronic kidney disease: Secondary | ICD-10-CM | POA: Diagnosis not present

## 2018-06-08 DIAGNOSIS — Z992 Dependence on renal dialysis: Secondary | ICD-10-CM | POA: Diagnosis not present

## 2018-06-08 DIAGNOSIS — N186 End stage renal disease: Secondary | ICD-10-CM | POA: Diagnosis not present

## 2018-06-08 DIAGNOSIS — D509 Iron deficiency anemia, unspecified: Secondary | ICD-10-CM | POA: Diagnosis not present

## 2018-06-09 DIAGNOSIS — Z992 Dependence on renal dialysis: Secondary | ICD-10-CM | POA: Diagnosis not present

## 2018-06-09 DIAGNOSIS — D509 Iron deficiency anemia, unspecified: Secondary | ICD-10-CM | POA: Diagnosis not present

## 2018-06-09 DIAGNOSIS — N186 End stage renal disease: Secondary | ICD-10-CM | POA: Diagnosis not present

## 2018-06-09 DIAGNOSIS — D631 Anemia in chronic kidney disease: Secondary | ICD-10-CM | POA: Diagnosis not present

## 2018-06-10 DIAGNOSIS — D631 Anemia in chronic kidney disease: Secondary | ICD-10-CM | POA: Diagnosis not present

## 2018-06-10 DIAGNOSIS — D509 Iron deficiency anemia, unspecified: Secondary | ICD-10-CM | POA: Diagnosis not present

## 2018-06-10 DIAGNOSIS — N186 End stage renal disease: Secondary | ICD-10-CM | POA: Diagnosis not present

## 2018-06-10 DIAGNOSIS — Z992 Dependence on renal dialysis: Secondary | ICD-10-CM | POA: Diagnosis not present

## 2018-06-11 DIAGNOSIS — Z992 Dependence on renal dialysis: Secondary | ICD-10-CM | POA: Diagnosis not present

## 2018-06-11 DIAGNOSIS — N186 End stage renal disease: Secondary | ICD-10-CM | POA: Diagnosis not present

## 2018-06-11 DIAGNOSIS — D509 Iron deficiency anemia, unspecified: Secondary | ICD-10-CM | POA: Diagnosis not present

## 2018-06-11 DIAGNOSIS — D631 Anemia in chronic kidney disease: Secondary | ICD-10-CM | POA: Diagnosis not present

## 2018-06-12 DIAGNOSIS — Z992 Dependence on renal dialysis: Secondary | ICD-10-CM | POA: Diagnosis not present

## 2018-06-12 DIAGNOSIS — N186 End stage renal disease: Secondary | ICD-10-CM | POA: Diagnosis not present

## 2018-06-12 DIAGNOSIS — D631 Anemia in chronic kidney disease: Secondary | ICD-10-CM | POA: Diagnosis not present

## 2018-06-12 DIAGNOSIS — D509 Iron deficiency anemia, unspecified: Secondary | ICD-10-CM | POA: Diagnosis not present

## 2018-06-13 DIAGNOSIS — N186 End stage renal disease: Secondary | ICD-10-CM | POA: Diagnosis not present

## 2018-06-13 DIAGNOSIS — D631 Anemia in chronic kidney disease: Secondary | ICD-10-CM | POA: Diagnosis not present

## 2018-06-13 DIAGNOSIS — Z992 Dependence on renal dialysis: Secondary | ICD-10-CM | POA: Diagnosis not present

## 2018-06-13 DIAGNOSIS — E113313 Type 2 diabetes mellitus with moderate nonproliferative diabetic retinopathy with macular edema, bilateral: Secondary | ICD-10-CM | POA: Diagnosis not present

## 2018-06-13 DIAGNOSIS — D509 Iron deficiency anemia, unspecified: Secondary | ICD-10-CM | POA: Diagnosis not present

## 2018-06-14 DIAGNOSIS — D509 Iron deficiency anemia, unspecified: Secondary | ICD-10-CM | POA: Diagnosis not present

## 2018-06-14 DIAGNOSIS — Z992 Dependence on renal dialysis: Secondary | ICD-10-CM | POA: Diagnosis not present

## 2018-06-14 DIAGNOSIS — D631 Anemia in chronic kidney disease: Secondary | ICD-10-CM | POA: Diagnosis not present

## 2018-06-14 DIAGNOSIS — N186 End stage renal disease: Secondary | ICD-10-CM | POA: Diagnosis not present

## 2018-06-15 DIAGNOSIS — D509 Iron deficiency anemia, unspecified: Secondary | ICD-10-CM | POA: Diagnosis not present

## 2018-06-15 DIAGNOSIS — N186 End stage renal disease: Secondary | ICD-10-CM | POA: Diagnosis not present

## 2018-06-15 DIAGNOSIS — D631 Anemia in chronic kidney disease: Secondary | ICD-10-CM | POA: Diagnosis not present

## 2018-06-15 DIAGNOSIS — Z992 Dependence on renal dialysis: Secondary | ICD-10-CM | POA: Diagnosis not present

## 2018-06-16 DIAGNOSIS — D631 Anemia in chronic kidney disease: Secondary | ICD-10-CM | POA: Diagnosis not present

## 2018-06-16 DIAGNOSIS — D509 Iron deficiency anemia, unspecified: Secondary | ICD-10-CM | POA: Diagnosis not present

## 2018-06-16 DIAGNOSIS — Z992 Dependence on renal dialysis: Secondary | ICD-10-CM | POA: Diagnosis not present

## 2018-06-16 DIAGNOSIS — N186 End stage renal disease: Secondary | ICD-10-CM | POA: Diagnosis not present

## 2018-06-17 DIAGNOSIS — N186 End stage renal disease: Secondary | ICD-10-CM | POA: Diagnosis not present

## 2018-06-17 DIAGNOSIS — Z992 Dependence on renal dialysis: Secondary | ICD-10-CM | POA: Diagnosis not present

## 2018-06-17 DIAGNOSIS — D631 Anemia in chronic kidney disease: Secondary | ICD-10-CM | POA: Diagnosis not present

## 2018-06-17 DIAGNOSIS — D509 Iron deficiency anemia, unspecified: Secondary | ICD-10-CM | POA: Diagnosis not present

## 2018-06-18 ENCOUNTER — Other Ambulatory Visit: Payer: Medicare Other | Admitting: Obstetrics and Gynecology

## 2018-06-18 DIAGNOSIS — Z992 Dependence on renal dialysis: Secondary | ICD-10-CM | POA: Diagnosis not present

## 2018-06-18 DIAGNOSIS — D631 Anemia in chronic kidney disease: Secondary | ICD-10-CM | POA: Diagnosis not present

## 2018-06-18 DIAGNOSIS — D509 Iron deficiency anemia, unspecified: Secondary | ICD-10-CM | POA: Diagnosis not present

## 2018-06-18 DIAGNOSIS — N186 End stage renal disease: Secondary | ICD-10-CM | POA: Diagnosis not present

## 2018-06-19 DIAGNOSIS — Z992 Dependence on renal dialysis: Secondary | ICD-10-CM | POA: Diagnosis not present

## 2018-06-19 DIAGNOSIS — N186 End stage renal disease: Secondary | ICD-10-CM | POA: Diagnosis not present

## 2018-06-19 DIAGNOSIS — D509 Iron deficiency anemia, unspecified: Secondary | ICD-10-CM | POA: Diagnosis not present

## 2018-06-19 DIAGNOSIS — D631 Anemia in chronic kidney disease: Secondary | ICD-10-CM | POA: Diagnosis not present

## 2018-06-20 ENCOUNTER — Telehealth: Payer: Self-pay | Admitting: *Deleted

## 2018-06-20 ENCOUNTER — Other Ambulatory Visit: Payer: Medicare Other | Admitting: Obstetrics and Gynecology

## 2018-06-20 DIAGNOSIS — D509 Iron deficiency anemia, unspecified: Secondary | ICD-10-CM | POA: Diagnosis not present

## 2018-06-20 DIAGNOSIS — Z992 Dependence on renal dialysis: Secondary | ICD-10-CM | POA: Diagnosis not present

## 2018-06-20 DIAGNOSIS — N186 End stage renal disease: Secondary | ICD-10-CM | POA: Diagnosis not present

## 2018-06-20 DIAGNOSIS — D631 Anemia in chronic kidney disease: Secondary | ICD-10-CM | POA: Diagnosis not present

## 2018-06-20 NOTE — Telephone Encounter (Signed)
LMOVM regarding visitor restrictions. Advised to call the office before coming to her appt if she has been in contact with any who in the last month has been confirmed or suspected of having COVID-19. Or if she has fever, cough, SOB, muscle pain, diarrhea, rash, vomiting, abdominal pain, weakness, red eye, bruising, bleeding, joint pain or severe headache.

## 2018-06-21 DIAGNOSIS — D631 Anemia in chronic kidney disease: Secondary | ICD-10-CM | POA: Diagnosis not present

## 2018-06-21 DIAGNOSIS — Z992 Dependence on renal dialysis: Secondary | ICD-10-CM | POA: Diagnosis not present

## 2018-06-21 DIAGNOSIS — N186 End stage renal disease: Secondary | ICD-10-CM | POA: Diagnosis not present

## 2018-06-21 DIAGNOSIS — D509 Iron deficiency anemia, unspecified: Secondary | ICD-10-CM | POA: Diagnosis not present

## 2018-06-22 DIAGNOSIS — D631 Anemia in chronic kidney disease: Secondary | ICD-10-CM | POA: Diagnosis not present

## 2018-06-22 DIAGNOSIS — N186 End stage renal disease: Secondary | ICD-10-CM | POA: Diagnosis not present

## 2018-06-22 DIAGNOSIS — D509 Iron deficiency anemia, unspecified: Secondary | ICD-10-CM | POA: Diagnosis not present

## 2018-06-22 DIAGNOSIS — Z992 Dependence on renal dialysis: Secondary | ICD-10-CM | POA: Diagnosis not present

## 2018-06-23 DIAGNOSIS — Z992 Dependence on renal dialysis: Secondary | ICD-10-CM | POA: Diagnosis not present

## 2018-06-23 DIAGNOSIS — D631 Anemia in chronic kidney disease: Secondary | ICD-10-CM | POA: Diagnosis not present

## 2018-06-23 DIAGNOSIS — N186 End stage renal disease: Secondary | ICD-10-CM | POA: Diagnosis not present

## 2018-06-23 DIAGNOSIS — D509 Iron deficiency anemia, unspecified: Secondary | ICD-10-CM | POA: Diagnosis not present

## 2018-06-24 DIAGNOSIS — N186 End stage renal disease: Secondary | ICD-10-CM | POA: Diagnosis not present

## 2018-06-24 DIAGNOSIS — D509 Iron deficiency anemia, unspecified: Secondary | ICD-10-CM | POA: Diagnosis not present

## 2018-06-24 DIAGNOSIS — D631 Anemia in chronic kidney disease: Secondary | ICD-10-CM | POA: Diagnosis not present

## 2018-06-24 DIAGNOSIS — Z992 Dependence on renal dialysis: Secondary | ICD-10-CM | POA: Diagnosis not present

## 2018-06-25 DIAGNOSIS — Z992 Dependence on renal dialysis: Secondary | ICD-10-CM | POA: Diagnosis not present

## 2018-06-25 DIAGNOSIS — N186 End stage renal disease: Secondary | ICD-10-CM | POA: Diagnosis not present

## 2018-06-25 DIAGNOSIS — D631 Anemia in chronic kidney disease: Secondary | ICD-10-CM | POA: Diagnosis not present

## 2018-06-25 DIAGNOSIS — D509 Iron deficiency anemia, unspecified: Secondary | ICD-10-CM | POA: Diagnosis not present

## 2018-06-26 ENCOUNTER — Ambulatory Visit (INDEPENDENT_AMBULATORY_CARE_PROVIDER_SITE_OTHER): Payer: Medicare Other | Admitting: Obstetrics and Gynecology

## 2018-06-26 ENCOUNTER — Encounter: Payer: Self-pay | Admitting: Obstetrics and Gynecology

## 2018-06-26 ENCOUNTER — Other Ambulatory Visit: Payer: Self-pay | Admitting: Obstetrics and Gynecology

## 2018-06-26 ENCOUNTER — Other Ambulatory Visit: Payer: Self-pay

## 2018-06-26 VITALS — BP 153/82 | HR 76 | Temp 97.6°F | Ht 68.0 in | Wt 264.4 lb

## 2018-06-26 DIAGNOSIS — N920 Excessive and frequent menstruation with regular cycle: Secondary | ICD-10-CM | POA: Diagnosis not present

## 2018-06-26 DIAGNOSIS — D509 Iron deficiency anemia, unspecified: Secondary | ICD-10-CM | POA: Diagnosis not present

## 2018-06-26 DIAGNOSIS — D631 Anemia in chronic kidney disease: Secondary | ICD-10-CM | POA: Diagnosis not present

## 2018-06-26 DIAGNOSIS — N921 Excessive and frequent menstruation with irregular cycle: Secondary | ICD-10-CM | POA: Diagnosis not present

## 2018-06-26 DIAGNOSIS — Z992 Dependence on renal dialysis: Secondary | ICD-10-CM | POA: Diagnosis not present

## 2018-06-26 DIAGNOSIS — N186 End stage renal disease: Secondary | ICD-10-CM | POA: Diagnosis not present

## 2018-06-26 MED ORDER — CIPROFLOXACIN HCL 500 MG PO TABS: 500.0000 mg | ORAL_TABLET | Freq: Every day | ORAL | 0 refills | Status: DC

## 2018-06-26 NOTE — Progress Notes (Addendum)
Face-to-face visit  [facility] Visit  @DATE @            Patient name: Janet Mitchell MRN [id number]  Date of birth: Aug 12, 1965  CC & HPI: Face-to-face visit  Janet Mitchell is a 53 y.o. female presenting today for endometrial biopsy and follow-up regarding menorrhagia resulting in anemia, in combination with her end-stage renal disease she is required transfusion 3 or 4 times in the last 6 months. Recently she had her last menstrual period 27 May 2018 which was described as light x4 days then she had another menses March 20-23 which was described as 5 days 1 day light 3 days heavy with clots 1 day light.  ROS:  ROS   Pertinent History Reviewed:   Reviewed: Significant for ultrasound from the [facility] is reviewed.  The endometrial cavity is suboptimally viewed.  There is a question of endometrial fluid.  Exam was complicated by peritoneal dialysis and chronic peritoneal fluid distorting the ultrasound effort transvaginal ultrasound is performed by me today in order to assess the endometrial cavity I have done a point-of-care no charge ultrasound to reassess the endometrium and the uterus is anterior slightly enlarged difficult to view from below due to vaginal length and anatomy.  The uterus is very anterior Medical         Past Medical History:  Diagnosis Date  . Anemia   . CHF (congestive heart failure) ([place])   . Chronic cholecystitis with calculus   . ESRD (end stage renal disease) on dialysis [facility])    "peritoneal dialysis q hs" (03/09/2018)  . Headache    "a few/wk" (03/09/2018)  . History of blood transfusion 10/2017   "low blood count" (03/09/2018)  . Hypertension   . Spinal headache   . Type II diabetes mellitus ([place])                               Surgical Hx:    Past Surgical History:  Procedure Laterality Date  . AMPUTATION TOE Left 2013   Great toe  . CATARACT EXTRACTION W/ INTRAOCULAR LENS IMPLANT Right   . CESAREAN SECTION  1994; 1998  .  CHOLECYSTECTOMY N/A 03/09/2018   Procedure: LAPAROSCOPIC CHOLECYSTECTOMY WITH INTRAOPERATIVE CHOLANGIOGRAM ERAS PATHWAY;  Surgeon: Donnie Mesa, MD;  Location: [place];  Service: General;  Laterality: N/A;  . EYE SURGERY  05/15/2018   Removal of blood in the globe (due to DM)  . FLEXIBLE SIGMOIDOSCOPY N/A 11/24/2017   Procedure: FLEXIBLE SIGMOIDOSCOPY;  Surgeon: Daneil Dolin, MD;  [facility];  Service: Endoscopy;  Laterality: N/A;  . JOINT REPLACEMENT    . LAPAROSCOPIC CHOLECYSTECTOMY  03/09/2018  . PERITONEAL CATHETER INSERTION  2017  . TOTAL HIP ARTHROPLASTY Left 1997   Medications: Reviewed & Updated - see associated section                       Current Outpatient Medications:  .  ALPRAZolam (XANAX) 0.5 MG tablet, Take 0.5 mg by mouth at bedtime as needed for anxiety., Disp: , Rfl:  .  amLODipine (NORVASC) 10 MG tablet, Take 1 tablet (10 mg total) by mouth daily., Disp: 30 tablet, Rfl: 3 .  aspirin EC 81 MG tablet, Take 81 mg by mouth daily. , Disp: , Rfl:  .  calcitRIOL (ROCALTROL) 0.5 MCG capsule, Take 0.5-1 mcg by mouth See admin instructions. 0.64mcg on Mondays through  Fridays and take 34mcg on Saturdays and Sundays, Disp: , Rfl:  .  doxazosin (CARDURA) 4 MG tablet, Take 4 mg by mouth at bedtime., Disp: , Rfl:  .  Insulin Glargine (TOUJEO MAX SOLOSTAR) 300 UNIT/ML SOPN, Inject 14 Units into the skin at bedtime., Disp: 3 pen, Rfl: 3 .  labetalol (NORMODYNE) 200 MG tablet, Take 200 mg by mouth 2 (two) times daily., Disp: , Rfl:  .  torsemide (DEMADEX) 100 MG tablet, Take 1 tablet (100 mg total) by mouth at bedtime., Disp: 30 tablet, Rfl: 3   Social History: Reviewed -  reports that she is a non-smoker but has been exposed to tobacco smoke. She has never used smokeless tobacco.  Objective Findings:  Vitals: Blood pressure (!) 153/82, pulse 76, temperature 97.6 F (36.4 C), height 5\' 8"  (1.727 m), weight 264 lb 6.4 oz (119.9 kg), last menstrual period  06/15/2018.  PHYSICAL EXAMINATION General appearance - alert, well appearing, and in no distress, oriented to person, place, and time and overweight Mental status - alert, oriented to person, place, and time, normal mood, behavior, speech, dress, motor activity, and thought processes  Abdomen nontender with dialysis catheter at umbilicus PELVIC External genitalia -normal external genitalia Vulva -normal Vagina -estrogen secretions noted Cervix -difficult to visualize very anterior, snowman speculum required  Uterus -sounds to 9 cm and anteflexed position during endometrial biopsy Adnexa -no masses on ultrasound Wet Mount -  Rectal -   ENDOMETRIAL BIOPSY Patient given informed consent, signed copy in the chart, time out was performed. Appropriate time out taken. . The patient was placed in the lithotomy position and the cervix brought into view with sterile speculum.  Portio of cervix cleansed x 2 with betadine swabs.  A tenaculum was placed in the anterior lip of the cervix.  The uterus was sounded for depth of 9 cm. A pipelle was introduced to into the uterus, suction created,  and an endometrial sample was obtained. All equipment was removed and accounted for.  The patient tolerated the procedure well.  A small amount of tissue obtained   Patient given post procedure instructions.   Assessment & Plan:   A:  1. Menorrhagia, anemia due to end-stage renal disease worsened by menorrhagia  P:  1. Follow-up endometrial biopsy by phone, patient will be a candidate for IUD, Mirena, pending biopsy results 2. Cipro 500 daily x2 as prophylaxis   \                The patient will return in 2 weeks for results.

## 2018-06-27 ENCOUNTER — Telehealth: Payer: Self-pay | Admitting: Obstetrics and Gynecology

## 2018-06-27 ENCOUNTER — Other Ambulatory Visit: Payer: Self-pay | Admitting: Obstetrics and Gynecology

## 2018-06-27 DIAGNOSIS — Z992 Dependence on renal dialysis: Secondary | ICD-10-CM | POA: Diagnosis not present

## 2018-06-27 DIAGNOSIS — D509 Iron deficiency anemia, unspecified: Secondary | ICD-10-CM | POA: Diagnosis not present

## 2018-06-27 DIAGNOSIS — N2581 Secondary hyperparathyroidism of renal origin: Secondary | ICD-10-CM | POA: Diagnosis not present

## 2018-06-27 DIAGNOSIS — N186 End stage renal disease: Secondary | ICD-10-CM | POA: Diagnosis not present

## 2018-06-27 DIAGNOSIS — D631 Anemia in chronic kidney disease: Secondary | ICD-10-CM | POA: Diagnosis not present

## 2018-06-27 MED ORDER — MEDROXYPROGESTERONE ACETATE 10 MG PO TABS: 10.0000 mg | ORAL_TABLET | Freq: Every day | ORAL | 0 refills | Status: DC

## 2018-06-27 NOTE — Progress Notes (Signed)
Will notify pt of provera Rx and plan for IUD after next menses.

## 2018-06-27 NOTE — Telephone Encounter (Signed)
Informed to call office in a.m. for appointment and 14 to 18 days for IUD placement, Mirena.

## 2018-06-28 DIAGNOSIS — D631 Anemia in chronic kidney disease: Secondary | ICD-10-CM | POA: Diagnosis not present

## 2018-06-28 DIAGNOSIS — N186 End stage renal disease: Secondary | ICD-10-CM | POA: Diagnosis not present

## 2018-06-28 DIAGNOSIS — Z992 Dependence on renal dialysis: Secondary | ICD-10-CM | POA: Diagnosis not present

## 2018-06-28 DIAGNOSIS — N2581 Secondary hyperparathyroidism of renal origin: Secondary | ICD-10-CM | POA: Diagnosis not present

## 2018-06-28 DIAGNOSIS — D509 Iron deficiency anemia, unspecified: Secondary | ICD-10-CM | POA: Diagnosis not present

## 2018-06-29 DIAGNOSIS — N2581 Secondary hyperparathyroidism of renal origin: Secondary | ICD-10-CM | POA: Diagnosis not present

## 2018-06-29 DIAGNOSIS — D509 Iron deficiency anemia, unspecified: Secondary | ICD-10-CM | POA: Diagnosis not present

## 2018-06-29 DIAGNOSIS — Z992 Dependence on renal dialysis: Secondary | ICD-10-CM | POA: Diagnosis not present

## 2018-06-29 DIAGNOSIS — N186 End stage renal disease: Secondary | ICD-10-CM | POA: Diagnosis not present

## 2018-06-29 DIAGNOSIS — D631 Anemia in chronic kidney disease: Secondary | ICD-10-CM | POA: Diagnosis not present

## 2018-06-30 DIAGNOSIS — D509 Iron deficiency anemia, unspecified: Secondary | ICD-10-CM | POA: Diagnosis not present

## 2018-06-30 DIAGNOSIS — Z992 Dependence on renal dialysis: Secondary | ICD-10-CM | POA: Diagnosis not present

## 2018-06-30 DIAGNOSIS — D631 Anemia in chronic kidney disease: Secondary | ICD-10-CM | POA: Diagnosis not present

## 2018-06-30 DIAGNOSIS — N2581 Secondary hyperparathyroidism of renal origin: Secondary | ICD-10-CM | POA: Diagnosis not present

## 2018-06-30 DIAGNOSIS — N186 End stage renal disease: Secondary | ICD-10-CM | POA: Diagnosis not present

## 2018-07-01 DIAGNOSIS — N2581 Secondary hyperparathyroidism of renal origin: Secondary | ICD-10-CM | POA: Diagnosis not present

## 2018-07-01 DIAGNOSIS — D509 Iron deficiency anemia, unspecified: Secondary | ICD-10-CM | POA: Diagnosis not present

## 2018-07-01 DIAGNOSIS — D631 Anemia in chronic kidney disease: Secondary | ICD-10-CM | POA: Diagnosis not present

## 2018-07-01 DIAGNOSIS — N186 End stage renal disease: Secondary | ICD-10-CM | POA: Diagnosis not present

## 2018-07-01 DIAGNOSIS — Z992 Dependence on renal dialysis: Secondary | ICD-10-CM | POA: Diagnosis not present

## 2018-07-02 DIAGNOSIS — D509 Iron deficiency anemia, unspecified: Secondary | ICD-10-CM | POA: Diagnosis not present

## 2018-07-02 DIAGNOSIS — D631 Anemia in chronic kidney disease: Secondary | ICD-10-CM | POA: Diagnosis not present

## 2018-07-02 DIAGNOSIS — Z992 Dependence on renal dialysis: Secondary | ICD-10-CM | POA: Diagnosis not present

## 2018-07-02 DIAGNOSIS — N2581 Secondary hyperparathyroidism of renal origin: Secondary | ICD-10-CM | POA: Diagnosis not present

## 2018-07-02 DIAGNOSIS — N186 End stage renal disease: Secondary | ICD-10-CM | POA: Diagnosis not present

## 2018-07-03 DIAGNOSIS — N186 End stage renal disease: Secondary | ICD-10-CM | POA: Diagnosis not present

## 2018-07-03 DIAGNOSIS — D631 Anemia in chronic kidney disease: Secondary | ICD-10-CM | POA: Diagnosis not present

## 2018-07-03 DIAGNOSIS — D509 Iron deficiency anemia, unspecified: Secondary | ICD-10-CM | POA: Diagnosis not present

## 2018-07-03 DIAGNOSIS — N2581 Secondary hyperparathyroidism of renal origin: Secondary | ICD-10-CM | POA: Diagnosis not present

## 2018-07-03 DIAGNOSIS — Z992 Dependence on renal dialysis: Secondary | ICD-10-CM | POA: Diagnosis not present

## 2018-07-04 DIAGNOSIS — Z992 Dependence on renal dialysis: Secondary | ICD-10-CM | POA: Diagnosis not present

## 2018-07-04 DIAGNOSIS — D631 Anemia in chronic kidney disease: Secondary | ICD-10-CM | POA: Diagnosis not present

## 2018-07-04 DIAGNOSIS — N186 End stage renal disease: Secondary | ICD-10-CM | POA: Diagnosis not present

## 2018-07-04 DIAGNOSIS — N2581 Secondary hyperparathyroidism of renal origin: Secondary | ICD-10-CM | POA: Diagnosis not present

## 2018-07-04 DIAGNOSIS — D509 Iron deficiency anemia, unspecified: Secondary | ICD-10-CM | POA: Diagnosis not present

## 2018-07-05 DIAGNOSIS — D509 Iron deficiency anemia, unspecified: Secondary | ICD-10-CM | POA: Diagnosis not present

## 2018-07-05 DIAGNOSIS — D631 Anemia in chronic kidney disease: Secondary | ICD-10-CM | POA: Diagnosis not present

## 2018-07-05 DIAGNOSIS — N2581 Secondary hyperparathyroidism of renal origin: Secondary | ICD-10-CM | POA: Diagnosis not present

## 2018-07-05 DIAGNOSIS — Z794 Long term (current) use of insulin: Secondary | ICD-10-CM | POA: Diagnosis not present

## 2018-07-05 DIAGNOSIS — E119 Type 2 diabetes mellitus without complications: Secondary | ICD-10-CM | POA: Diagnosis not present

## 2018-07-05 DIAGNOSIS — N186 End stage renal disease: Secondary | ICD-10-CM | POA: Diagnosis not present

## 2018-07-05 DIAGNOSIS — Z992 Dependence on renal dialysis: Secondary | ICD-10-CM | POA: Diagnosis not present

## 2018-07-06 DIAGNOSIS — N186 End stage renal disease: Secondary | ICD-10-CM | POA: Diagnosis not present

## 2018-07-06 DIAGNOSIS — N2581 Secondary hyperparathyroidism of renal origin: Secondary | ICD-10-CM | POA: Diagnosis not present

## 2018-07-06 DIAGNOSIS — D509 Iron deficiency anemia, unspecified: Secondary | ICD-10-CM | POA: Diagnosis not present

## 2018-07-06 DIAGNOSIS — D631 Anemia in chronic kidney disease: Secondary | ICD-10-CM | POA: Diagnosis not present

## 2018-07-06 DIAGNOSIS — Z992 Dependence on renal dialysis: Secondary | ICD-10-CM | POA: Diagnosis not present

## 2018-07-07 DIAGNOSIS — N2581 Secondary hyperparathyroidism of renal origin: Secondary | ICD-10-CM | POA: Diagnosis not present

## 2018-07-07 DIAGNOSIS — N186 End stage renal disease: Secondary | ICD-10-CM | POA: Diagnosis not present

## 2018-07-07 DIAGNOSIS — D631 Anemia in chronic kidney disease: Secondary | ICD-10-CM | POA: Diagnosis not present

## 2018-07-07 DIAGNOSIS — Z992 Dependence on renal dialysis: Secondary | ICD-10-CM | POA: Diagnosis not present

## 2018-07-07 DIAGNOSIS — D509 Iron deficiency anemia, unspecified: Secondary | ICD-10-CM | POA: Diagnosis not present

## 2018-07-08 DIAGNOSIS — Z992 Dependence on renal dialysis: Secondary | ICD-10-CM | POA: Diagnosis not present

## 2018-07-08 DIAGNOSIS — N2581 Secondary hyperparathyroidism of renal origin: Secondary | ICD-10-CM | POA: Diagnosis not present

## 2018-07-08 DIAGNOSIS — N186 End stage renal disease: Secondary | ICD-10-CM | POA: Diagnosis not present

## 2018-07-08 DIAGNOSIS — D509 Iron deficiency anemia, unspecified: Secondary | ICD-10-CM | POA: Diagnosis not present

## 2018-07-08 DIAGNOSIS — D631 Anemia in chronic kidney disease: Secondary | ICD-10-CM | POA: Diagnosis not present

## 2018-07-09 DIAGNOSIS — D631 Anemia in chronic kidney disease: Secondary | ICD-10-CM | POA: Diagnosis not present

## 2018-07-09 DIAGNOSIS — Z992 Dependence on renal dialysis: Secondary | ICD-10-CM | POA: Diagnosis not present

## 2018-07-09 DIAGNOSIS — D509 Iron deficiency anemia, unspecified: Secondary | ICD-10-CM | POA: Diagnosis not present

## 2018-07-09 DIAGNOSIS — N186 End stage renal disease: Secondary | ICD-10-CM | POA: Diagnosis not present

## 2018-07-09 DIAGNOSIS — N2581 Secondary hyperparathyroidism of renal origin: Secondary | ICD-10-CM | POA: Diagnosis not present

## 2018-07-10 ENCOUNTER — Telehealth: Payer: Self-pay | Admitting: *Deleted

## 2018-07-10 DIAGNOSIS — N186 End stage renal disease: Secondary | ICD-10-CM | POA: Diagnosis not present

## 2018-07-10 DIAGNOSIS — D509 Iron deficiency anemia, unspecified: Secondary | ICD-10-CM | POA: Diagnosis not present

## 2018-07-10 DIAGNOSIS — D631 Anemia in chronic kidney disease: Secondary | ICD-10-CM | POA: Diagnosis not present

## 2018-07-10 DIAGNOSIS — Z992 Dependence on renal dialysis: Secondary | ICD-10-CM | POA: Diagnosis not present

## 2018-07-10 DIAGNOSIS — N2581 Secondary hyperparathyroidism of renal origin: Secondary | ICD-10-CM | POA: Diagnosis not present

## 2018-07-10 NOTE — Telephone Encounter (Signed)
Called patient and left message informing her that we are not allowing any visitors or children to come to visit with her at this time. Also requested that if she has had any exposure to anyone suspected or confirmed of having COVID-19 to call to reschedule appointment. Also requested that if she is experiencing any of the following to call and reschedule : fever, cough, sob, muscle pain, diarrhea, rash, vomiting, abdominal pain, red eye, weakness, bruising or bleeding, joint pain, or a severe headache.

## 2018-07-10 NOTE — Telephone Encounter (Signed)
Called patient and she is r/s'd for procedure to 7/29 at 8:30am. Patient aware will mail new instructions (confirmed address). Endo is aware of appt change.

## 2018-07-11 ENCOUNTER — Other Ambulatory Visit: Payer: Self-pay

## 2018-07-11 ENCOUNTER — Encounter: Payer: Self-pay | Admitting: Obstetrics and Gynecology

## 2018-07-11 ENCOUNTER — Ambulatory Visit (INDEPENDENT_AMBULATORY_CARE_PROVIDER_SITE_OTHER): Payer: Medicare Other | Admitting: Obstetrics and Gynecology

## 2018-07-11 VITALS — BP 158/87 | HR 71 | Temp 98.4°F | Ht 68.0 in | Wt 267.8 lb

## 2018-07-11 DIAGNOSIS — Z992 Dependence on renal dialysis: Secondary | ICD-10-CM | POA: Diagnosis not present

## 2018-07-11 DIAGNOSIS — Z30430 Encounter for insertion of intrauterine contraceptive device: Secondary | ICD-10-CM | POA: Diagnosis not present

## 2018-07-11 DIAGNOSIS — Z3202 Encounter for pregnancy test, result negative: Secondary | ICD-10-CM

## 2018-07-11 DIAGNOSIS — N921 Excessive and frequent menstruation with irregular cycle: Secondary | ICD-10-CM

## 2018-07-11 DIAGNOSIS — D631 Anemia in chronic kidney disease: Secondary | ICD-10-CM | POA: Diagnosis not present

## 2018-07-11 DIAGNOSIS — N2581 Secondary hyperparathyroidism of renal origin: Secondary | ICD-10-CM | POA: Diagnosis not present

## 2018-07-11 DIAGNOSIS — D509 Iron deficiency anemia, unspecified: Secondary | ICD-10-CM | POA: Diagnosis not present

## 2018-07-11 DIAGNOSIS — N186 End stage renal disease: Secondary | ICD-10-CM | POA: Diagnosis not present

## 2018-07-11 NOTE — Patient Instructions (Signed)
IUDLevonorgestrel intrauterine device (IUD) What is this medicine? LEVONORGESTREL IUD (LEE voe nor jes trel) is a contraceptive (birth control) device. The device is placed inside the uterus by a healthcare professional. It is used to prevent pregnancy. This device can also be used to treat heavy bleeding that occurs during your period. This medicine may be used for other purposes; ask your health care provider or pharmacist if you have questions. COMMON BRAND NAME(S): Minette Headland What should I tell my health care provider before I take this medicine? They need to know if you have any of these conditions: -abnormal Pap smear -cancer of the breast, uterus, or cervix -diabetes -endometritis -genital or pelvic infection now or in the past -have more than one sexual partner or your partner has more than one partner -heart disease -history of an ectopic or tubal pregnancy -immune system problems -IUD in place -liver disease or tumor -problems with blood clots or take blood-thinners -seizures -use intravenous drugs -uterus of unusual shape -vaginal bleeding that has not been explained -an unusual or allergic reaction to levonorgestrel, other hormones, silicone, or polyethylene, medicines, foods, dyes, or preservatives -pregnant or trying to get pregnant -breast-feeding How should I use this medicine? This device is placed inside the uterus by a health care professional. Talk to your pediatrician regarding the use of this medicine in children. Special care may be needed. Overdosage: If you think you have taken too much of this medicine contact a poison control center or emergency room at once. NOTE: This medicine is only for you. Do not share this medicine with others. What if I miss a dose? This does not apply. Depending on the brand of device you have inserted, the device will need to be replaced every 3 to 5 years if you wish to continue using this type of birth  control. What may interact with this medicine? Do not take this medicine with any of the following medications: -amprenavir -bosentan -fosamprenavir This medicine may also interact with the following medications: -aprepitant -armodafinil -barbiturate medicines for inducing sleep or treating seizures -bexarotene -boceprevir -griseofulvin -medicines to treat seizures like carbamazepine, ethotoin, felbamate, oxcarbazepine, phenytoin, topiramate -modafinil -pioglitazone -rifabutin -rifampin -rifapentine -some medicines to treat HIV infection like atazanavir, efavirenz, indinavir, lopinavir, nelfinavir, tipranavir, ritonavir -St. Toshi Ishii's wort -warfarin This list may not describe all possible interactions. Give your health care provider a list of all the medicines, herbs, non-prescription drugs, or dietary supplements you use. Also tell them if you smoke, drink alcohol, or use illegal drugs. Some items may interact with your medicine. What should I watch for while using this medicine? Visit your doctor or health care professional for regular check ups. See your doctor if you or your partner has sexual contact with others, becomes HIV positive, or gets a sexual transmitted disease. This product does not protect you against HIV infection (AIDS) or other sexually transmitted diseases. You can check the placement of the IUD yourself by reaching up to the top of your vagina with clean fingers to feel the threads. Do not pull on the threads. It is a good habit to check placement after each menstrual period. Call your doctor right away if you feel more of the IUD than just the threads or if you cannot feel the threads at all. The IUD may come out by itself. You may become pregnant if the device comes out. If you notice that the IUD has come out use a backup birth control method like condoms and call your  health care provider. Using tampons will not change the position of the IUD and are okay to use  during your period. This IUD can be safely scanned with magnetic resonance imaging (MRI) only under specific conditions. Before you have an MRI, tell your healthcare provider that you have an IUD in place, and which type of IUD you have in place. What side effects may I notice from receiving this medicine? Side effects that you should report to your doctor or health care professional as soon as possible: -allergic reactions like skin rash, itching or hives, swelling of the face, lips, or tongue -fever, flu-like symptoms -genital sores -high blood pressure -no menstrual period for 6 weeks during use -pain, swelling, warmth in the leg -pelvic pain or tenderness -severe or sudden headache -signs of pregnancy -stomach cramping -sudden shortness of breath -trouble with balance, talking, or walking -unusual vaginal bleeding, discharge -yellowing of the eyes or skin Side effects that usually do not require medical attention (report to your doctor or health care professional if they continue or are bothersome): -acne -breast pain -change in sex drive or performance -changes in weight -cramping, dizziness, or faintness while the device is being inserted -headache -irregular menstrual bleeding within first 3 to 6 months of use -nausea This list may not describe all possible side effects. Call your doctor for medical advice about side effects. You may report side effects to FDA at 1-800-FDA-1088. Where should I keep my medicine? This does not apply. NOTE: This sheet is a summary. It may not cover all possible information. If you have questions about this medicine, talk to your doctor, pharmacist, or health care provider.  2019 Elsevier/Gold Standard (2015-12-25 14:14:56)

## 2018-07-11 NOTE — Progress Notes (Signed)
[facility] Visit  @DATE @            Patient name: Janet Mitchell MRN [id number]  Date of birth: 11-Oct-1965  CC & HPI:  Janet Mitchell is a 53 y.o. female presenting today for IUD to control menses in a patient who is got chronic anemia associated with her end-stage renal disease, and losing blood due to menses.  Endometrial biopsy has been performed which showed benign proliferative endometrium. The patient had bleeding begin 27 June 2018, and she did not take the Provera prescribed to her  ROS:  ROS Janet Mitchell is always been a very difficult exam and is made worse by the peritoneal dialysis and the residual fluid in the pelvis Therefore ultrasound is performed as a part of this procedure to guide proper positioning.  Additionally she requires a super long speculum, snowman speculum, placed inverted position in order to visualize the cervix.  Pertinent History Reviewed:   Reviewed: Significant for ESRD peritoneal dialysis, history of 2 prior cesareans Medical         Past Medical History:  Diagnosis Date  . Anemia   . CHF (congestive heart failure) ([place])   . Chronic cholecystitis with calculus   . ESRD (end stage renal disease) on dialysis [facility])    "peritoneal dialysis q hs" (03/09/2018)  . Headache    "a few/wk" (03/09/2018)  . History of blood transfusion 10/2017   "low blood count" (03/09/2018)  . Hypertension   . Spinal headache   . Type II diabetes mellitus ([place])                               Surgical Hx:    Past Surgical History:  Procedure Laterality Date  . AMPUTATION TOE Left 2013   Great toe  . CATARACT EXTRACTION W/ INTRAOCULAR LENS IMPLANT Right   . CESAREAN SECTION  1994; 1998  . CHOLECYSTECTOMY N/A 03/09/2018   Procedure: LAPAROSCOPIC CHOLECYSTECTOMY WITH INTRAOPERATIVE CHOLANGIOGRAM ERAS PATHWAY;  Surgeon: Donnie Mesa, MD;  Location: [place];  Service: General;  Laterality: N/A;  . EYE SURGERY  05/15/2018   Removal of blood in the globe  (due to DM)  . FLEXIBLE SIGMOIDOSCOPY N/A 11/24/2017   Procedure: FLEXIBLE SIGMOIDOSCOPY;  Surgeon: Daneil Dolin, MD;  [facility];  Service: Endoscopy;  Laterality: N/A;  . JOINT REPLACEMENT    . LAPAROSCOPIC CHOLECYSTECTOMY  03/09/2018  . PERITONEAL CATHETER INSERTION  2017  . TOTAL HIP ARTHROPLASTY Left 1997   Medications: Reviewed & Updated - see associated section                       Current Outpatient Medications:  .  ALPRAZolam (XANAX) 0.5 MG tablet, Take 0.5 mg by mouth at bedtime as needed for anxiety., Disp: , Rfl:  .  amLODipine (NORVASC) 10 MG tablet, Take 1 tablet (10 mg total) by mouth daily., Disp: 30 tablet, Rfl: 3 .  aspirin EC 81 MG tablet, Take 81 mg by mouth daily. , Disp: , Rfl:  .  calcitRIOL (ROCALTROL) 0.5 MCG capsule, Take 0.5-1 mcg by mouth See admin instructions. 0.35mcg on Mondays through Fridays and take 5mcg on Saturdays and Sundays, Disp: , Rfl:  .  doxazosin (CARDURA) 4 MG tablet, Take 4 mg by mouth at bedtime., Disp: , Rfl:  .  Insulin Glargine (TOUJEO MAX SOLOSTAR) 300 UNIT/ML SOPN, Inject 14 Units into the  skin at bedtime., Disp: 3 pen, Rfl: 3 .  labetalol (NORMODYNE) 200 MG tablet, Take 200 mg by mouth 2 (two) times daily., Disp: , Rfl:  .  torsemide (DEMADEX) 100 MG tablet, Take 1 tablet (100 mg total) by mouth at bedtime., Disp: 30 tablet, Rfl: 3 .  ciprofloxacin (CIPRO) 500 MG tablet, Take 1 tablet (500 mg total) by mouth daily. (Patient not taking: Reported on 07/11/2018), Disp: 2 tablet, Rfl: 0 .  medroxyPROGESTERone (PROVERA) 10 MG tablet, Take 1 tablet (10 mg total) by mouth daily for 10 days. One tablet daily x 10d. Repeat as directed, Disp: 10 tablet, Rfl: 0   Social History: Reviewed -  reports that she is a non-smoker but has been exposed to tobacco smoke. She has never used smokeless tobacco.  Objective Findings:  Vitals: Blood pressure (!) 158/87, pulse 71, temperature 98.4 F (36.9 C), height 5\' 8"  (1.727 m), weight 121.5 kg,  last menstrual period 06/27/2018.  PHYSICAL EXAMINATION General appearance - alert, well appearing, and in no distress, overweight and chronically ill appearing Mental status - alert, oriented to person, place, and time, normal mood, behavior, speech, dress, motor activity, and thought processes Chest -  Heart -  Abdomen - soft, nontender, nondistended, no masses or organomegaly peritoneal dialysis catheter located at umbilicus Breasts -  Skin - normal coloration and turgor, no rashes, no suspicious skin lesions noted  PELVIC External genitalia -normal female generous mucus discharge Vulva -normal tissue Vagina -heavy discharge nonpurulent wet prep was performed which shows numerous epithelial cells minimal white cells Cervix -clear midcycle mucus when cervix could finally be visualized with the inverted speculum snowman technique Uterus -on ultrasound the uterus is anteverted to midplane.  Appears grossly normal size peritoneal cavity is filled with significant amount of fluid as expected  Adnexa -peritoneal fluid Wet Mount -numerous white cells Rectal - rectal exam not indicated   GYNECOLOGY CLINIC PROCEDURE NOTE  Janet Mitchell is a 53 y.o. G2P2 here for Mirena IUD insertion.  No GYN concerns.  Last pap smear  was normal.  IUD Insertion Procedure Note Patient identified, informed consent performed, consent signed.   Discussed risks of irregular bleeding, cramping, infection, malpositioning or misplacement of the IUD outside the uterus which may require further procedure such as laparoscopy. Time out was performed.  Urine pregnancy test negative.  Speculum placed in the vagina.  Cervix visualized.  Cleaned with Betadine x 2.  Grasped anteriorly with a single tooth tenaculum.   Uterus sounded to 9 cm.   IUD placed per manufacturer's recommendations.  Strings trimmed to 3 cm. Tenaculum was removed, good hemostasis noted.  Patient tolerated procedure well.  Transvaginal Ultrasound  was used to confirm IUD position and though the uterus was now mid plane and the visibility was not ideal, I could see the echoes from the IUD in the center of the uterus  Patient was given post-procedure instructions.  She was advised to have backup contraception for one week.  Patient was also asked to check IUD strings periodically.  Follow up in 4 weeks for IUD check.    Jonnie Kind, MD Attending Obstetrician & Gynecologist [facility], [facility]    Assessment & Plan:   A:  1. IUD insertion 2. ESRD with peritoneal dialysis 3. Menorrhagia with anemia associated with ESRD  P:  1. Doxycycline 100 mg twice daily today as antibiotic prophylaxis Follow-up 4 weeks

## 2018-07-11 NOTE — Addendum Note (Signed)
Addended by: Jonnie Kind on: 07/11/2018 04:00 PM   Modules accepted: Level of Service

## 2018-07-12 DIAGNOSIS — D509 Iron deficiency anemia, unspecified: Secondary | ICD-10-CM | POA: Diagnosis not present

## 2018-07-12 DIAGNOSIS — D631 Anemia in chronic kidney disease: Secondary | ICD-10-CM | POA: Diagnosis not present

## 2018-07-12 DIAGNOSIS — N186 End stage renal disease: Secondary | ICD-10-CM | POA: Diagnosis not present

## 2018-07-12 DIAGNOSIS — Z992 Dependence on renal dialysis: Secondary | ICD-10-CM | POA: Diagnosis not present

## 2018-07-12 DIAGNOSIS — N2581 Secondary hyperparathyroidism of renal origin: Secondary | ICD-10-CM | POA: Diagnosis not present

## 2018-07-13 DIAGNOSIS — D631 Anemia in chronic kidney disease: Secondary | ICD-10-CM | POA: Diagnosis not present

## 2018-07-13 DIAGNOSIS — N2581 Secondary hyperparathyroidism of renal origin: Secondary | ICD-10-CM | POA: Diagnosis not present

## 2018-07-13 DIAGNOSIS — D509 Iron deficiency anemia, unspecified: Secondary | ICD-10-CM | POA: Diagnosis not present

## 2018-07-13 DIAGNOSIS — N186 End stage renal disease: Secondary | ICD-10-CM | POA: Diagnosis not present

## 2018-07-13 DIAGNOSIS — Z992 Dependence on renal dialysis: Secondary | ICD-10-CM | POA: Diagnosis not present

## 2018-07-14 DIAGNOSIS — Z992 Dependence on renal dialysis: Secondary | ICD-10-CM | POA: Diagnosis not present

## 2018-07-14 DIAGNOSIS — N186 End stage renal disease: Secondary | ICD-10-CM | POA: Diagnosis not present

## 2018-07-14 DIAGNOSIS — N2581 Secondary hyperparathyroidism of renal origin: Secondary | ICD-10-CM | POA: Diagnosis not present

## 2018-07-14 DIAGNOSIS — D631 Anemia in chronic kidney disease: Secondary | ICD-10-CM | POA: Diagnosis not present

## 2018-07-14 DIAGNOSIS — D509 Iron deficiency anemia, unspecified: Secondary | ICD-10-CM | POA: Diagnosis not present

## 2018-07-15 DIAGNOSIS — N186 End stage renal disease: Secondary | ICD-10-CM | POA: Diagnosis not present

## 2018-07-15 DIAGNOSIS — N2581 Secondary hyperparathyroidism of renal origin: Secondary | ICD-10-CM | POA: Diagnosis not present

## 2018-07-15 DIAGNOSIS — D509 Iron deficiency anemia, unspecified: Secondary | ICD-10-CM | POA: Diagnosis not present

## 2018-07-15 DIAGNOSIS — Z992 Dependence on renal dialysis: Secondary | ICD-10-CM | POA: Diagnosis not present

## 2018-07-15 DIAGNOSIS — D631 Anemia in chronic kidney disease: Secondary | ICD-10-CM | POA: Diagnosis not present

## 2018-07-16 DIAGNOSIS — D631 Anemia in chronic kidney disease: Secondary | ICD-10-CM | POA: Diagnosis not present

## 2018-07-16 DIAGNOSIS — Z992 Dependence on renal dialysis: Secondary | ICD-10-CM | POA: Diagnosis not present

## 2018-07-16 DIAGNOSIS — N2581 Secondary hyperparathyroidism of renal origin: Secondary | ICD-10-CM | POA: Diagnosis not present

## 2018-07-16 DIAGNOSIS — D509 Iron deficiency anemia, unspecified: Secondary | ICD-10-CM | POA: Diagnosis not present

## 2018-07-16 DIAGNOSIS — N186 End stage renal disease: Secondary | ICD-10-CM | POA: Diagnosis not present

## 2018-07-17 DIAGNOSIS — D631 Anemia in chronic kidney disease: Secondary | ICD-10-CM | POA: Diagnosis not present

## 2018-07-17 DIAGNOSIS — N186 End stage renal disease: Secondary | ICD-10-CM | POA: Diagnosis not present

## 2018-07-17 DIAGNOSIS — Z992 Dependence on renal dialysis: Secondary | ICD-10-CM | POA: Diagnosis not present

## 2018-07-17 DIAGNOSIS — N2581 Secondary hyperparathyroidism of renal origin: Secondary | ICD-10-CM | POA: Diagnosis not present

## 2018-07-17 DIAGNOSIS — D509 Iron deficiency anemia, unspecified: Secondary | ICD-10-CM | POA: Diagnosis not present

## 2018-07-18 DIAGNOSIS — Z992 Dependence on renal dialysis: Secondary | ICD-10-CM | POA: Diagnosis not present

## 2018-07-18 DIAGNOSIS — N2581 Secondary hyperparathyroidism of renal origin: Secondary | ICD-10-CM | POA: Diagnosis not present

## 2018-07-18 DIAGNOSIS — D631 Anemia in chronic kidney disease: Secondary | ICD-10-CM | POA: Diagnosis not present

## 2018-07-18 DIAGNOSIS — D509 Iron deficiency anemia, unspecified: Secondary | ICD-10-CM | POA: Diagnosis not present

## 2018-07-18 DIAGNOSIS — N186 End stage renal disease: Secondary | ICD-10-CM | POA: Diagnosis not present

## 2018-07-19 DIAGNOSIS — Z992 Dependence on renal dialysis: Secondary | ICD-10-CM | POA: Diagnosis not present

## 2018-07-19 DIAGNOSIS — N2581 Secondary hyperparathyroidism of renal origin: Secondary | ICD-10-CM | POA: Diagnosis not present

## 2018-07-19 DIAGNOSIS — D509 Iron deficiency anemia, unspecified: Secondary | ICD-10-CM | POA: Diagnosis not present

## 2018-07-19 DIAGNOSIS — D631 Anemia in chronic kidney disease: Secondary | ICD-10-CM | POA: Diagnosis not present

## 2018-07-19 DIAGNOSIS — N186 End stage renal disease: Secondary | ICD-10-CM | POA: Diagnosis not present

## 2018-07-20 DIAGNOSIS — N2581 Secondary hyperparathyroidism of renal origin: Secondary | ICD-10-CM | POA: Diagnosis not present

## 2018-07-20 DIAGNOSIS — D631 Anemia in chronic kidney disease: Secondary | ICD-10-CM | POA: Diagnosis not present

## 2018-07-20 DIAGNOSIS — Z992 Dependence on renal dialysis: Secondary | ICD-10-CM | POA: Diagnosis not present

## 2018-07-20 DIAGNOSIS — N186 End stage renal disease: Secondary | ICD-10-CM | POA: Diagnosis not present

## 2018-07-20 DIAGNOSIS — D509 Iron deficiency anemia, unspecified: Secondary | ICD-10-CM | POA: Diagnosis not present

## 2018-07-21 DIAGNOSIS — D631 Anemia in chronic kidney disease: Secondary | ICD-10-CM | POA: Diagnosis not present

## 2018-07-21 DIAGNOSIS — Z992 Dependence on renal dialysis: Secondary | ICD-10-CM | POA: Diagnosis not present

## 2018-07-21 DIAGNOSIS — N186 End stage renal disease: Secondary | ICD-10-CM | POA: Diagnosis not present

## 2018-07-21 DIAGNOSIS — N2581 Secondary hyperparathyroidism of renal origin: Secondary | ICD-10-CM | POA: Diagnosis not present

## 2018-07-21 DIAGNOSIS — D509 Iron deficiency anemia, unspecified: Secondary | ICD-10-CM | POA: Diagnosis not present

## 2018-07-22 DIAGNOSIS — N2581 Secondary hyperparathyroidism of renal origin: Secondary | ICD-10-CM | POA: Diagnosis not present

## 2018-07-22 DIAGNOSIS — Z992 Dependence on renal dialysis: Secondary | ICD-10-CM | POA: Diagnosis not present

## 2018-07-22 DIAGNOSIS — N186 End stage renal disease: Secondary | ICD-10-CM | POA: Diagnosis not present

## 2018-07-22 DIAGNOSIS — D509 Iron deficiency anemia, unspecified: Secondary | ICD-10-CM | POA: Diagnosis not present

## 2018-07-22 DIAGNOSIS — D631 Anemia in chronic kidney disease: Secondary | ICD-10-CM | POA: Diagnosis not present

## 2018-07-23 DIAGNOSIS — N186 End stage renal disease: Secondary | ICD-10-CM | POA: Diagnosis not present

## 2018-07-23 DIAGNOSIS — D509 Iron deficiency anemia, unspecified: Secondary | ICD-10-CM | POA: Diagnosis not present

## 2018-07-23 DIAGNOSIS — D631 Anemia in chronic kidney disease: Secondary | ICD-10-CM | POA: Diagnosis not present

## 2018-07-23 DIAGNOSIS — Z992 Dependence on renal dialysis: Secondary | ICD-10-CM | POA: Diagnosis not present

## 2018-07-23 DIAGNOSIS — N2581 Secondary hyperparathyroidism of renal origin: Secondary | ICD-10-CM | POA: Diagnosis not present

## 2018-07-24 DIAGNOSIS — N2581 Secondary hyperparathyroidism of renal origin: Secondary | ICD-10-CM | POA: Diagnosis not present

## 2018-07-24 DIAGNOSIS — D631 Anemia in chronic kidney disease: Secondary | ICD-10-CM | POA: Diagnosis not present

## 2018-07-24 DIAGNOSIS — D509 Iron deficiency anemia, unspecified: Secondary | ICD-10-CM | POA: Diagnosis not present

## 2018-07-24 DIAGNOSIS — Z992 Dependence on renal dialysis: Secondary | ICD-10-CM | POA: Diagnosis not present

## 2018-07-24 DIAGNOSIS — N186 End stage renal disease: Secondary | ICD-10-CM | POA: Diagnosis not present

## 2018-07-25 DIAGNOSIS — D509 Iron deficiency anemia, unspecified: Secondary | ICD-10-CM | POA: Diagnosis not present

## 2018-07-25 DIAGNOSIS — D631 Anemia in chronic kidney disease: Secondary | ICD-10-CM | POA: Diagnosis not present

## 2018-07-25 DIAGNOSIS — N2581 Secondary hyperparathyroidism of renal origin: Secondary | ICD-10-CM | POA: Diagnosis not present

## 2018-07-25 DIAGNOSIS — Z992 Dependence on renal dialysis: Secondary | ICD-10-CM | POA: Diagnosis not present

## 2018-07-25 DIAGNOSIS — N186 End stage renal disease: Secondary | ICD-10-CM | POA: Diagnosis not present

## 2018-07-26 DIAGNOSIS — D509 Iron deficiency anemia, unspecified: Secondary | ICD-10-CM | POA: Diagnosis not present

## 2018-07-26 DIAGNOSIS — D631 Anemia in chronic kidney disease: Secondary | ICD-10-CM | POA: Diagnosis not present

## 2018-07-26 DIAGNOSIS — N186 End stage renal disease: Secondary | ICD-10-CM | POA: Diagnosis not present

## 2018-07-26 DIAGNOSIS — N2581 Secondary hyperparathyroidism of renal origin: Secondary | ICD-10-CM | POA: Diagnosis not present

## 2018-07-26 DIAGNOSIS — Z992 Dependence on renal dialysis: Secondary | ICD-10-CM | POA: Diagnosis not present

## 2018-07-27 DIAGNOSIS — D509 Iron deficiency anemia, unspecified: Secondary | ICD-10-CM | POA: Diagnosis not present

## 2018-07-27 DIAGNOSIS — D631 Anemia in chronic kidney disease: Secondary | ICD-10-CM | POA: Diagnosis not present

## 2018-07-27 DIAGNOSIS — N186 End stage renal disease: Secondary | ICD-10-CM | POA: Diagnosis not present

## 2018-07-27 DIAGNOSIS — N2581 Secondary hyperparathyroidism of renal origin: Secondary | ICD-10-CM | POA: Diagnosis not present

## 2018-07-27 DIAGNOSIS — Z992 Dependence on renal dialysis: Secondary | ICD-10-CM | POA: Diagnosis not present

## 2018-07-28 DIAGNOSIS — Z992 Dependence on renal dialysis: Secondary | ICD-10-CM | POA: Diagnosis not present

## 2018-07-28 DIAGNOSIS — D631 Anemia in chronic kidney disease: Secondary | ICD-10-CM | POA: Diagnosis not present

## 2018-07-28 DIAGNOSIS — D509 Iron deficiency anemia, unspecified: Secondary | ICD-10-CM | POA: Diagnosis not present

## 2018-07-28 DIAGNOSIS — N2581 Secondary hyperparathyroidism of renal origin: Secondary | ICD-10-CM | POA: Diagnosis not present

## 2018-07-28 DIAGNOSIS — N186 End stage renal disease: Secondary | ICD-10-CM | POA: Diagnosis not present

## 2018-07-29 DIAGNOSIS — Z992 Dependence on renal dialysis: Secondary | ICD-10-CM | POA: Diagnosis not present

## 2018-07-29 DIAGNOSIS — D509 Iron deficiency anemia, unspecified: Secondary | ICD-10-CM | POA: Diagnosis not present

## 2018-07-29 DIAGNOSIS — N186 End stage renal disease: Secondary | ICD-10-CM | POA: Diagnosis not present

## 2018-07-29 DIAGNOSIS — N2581 Secondary hyperparathyroidism of renal origin: Secondary | ICD-10-CM | POA: Diagnosis not present

## 2018-07-29 DIAGNOSIS — D631 Anemia in chronic kidney disease: Secondary | ICD-10-CM | POA: Diagnosis not present

## 2018-07-30 DIAGNOSIS — D631 Anemia in chronic kidney disease: Secondary | ICD-10-CM | POA: Diagnosis not present

## 2018-07-30 DIAGNOSIS — D509 Iron deficiency anemia, unspecified: Secondary | ICD-10-CM | POA: Diagnosis not present

## 2018-07-30 DIAGNOSIS — N2581 Secondary hyperparathyroidism of renal origin: Secondary | ICD-10-CM | POA: Diagnosis not present

## 2018-07-30 DIAGNOSIS — N186 End stage renal disease: Secondary | ICD-10-CM | POA: Diagnosis not present

## 2018-07-30 DIAGNOSIS — Z992 Dependence on renal dialysis: Secondary | ICD-10-CM | POA: Diagnosis not present

## 2018-07-31 ENCOUNTER — Encounter (HOSPITAL_COMMUNITY): Payer: Self-pay

## 2018-07-31 ENCOUNTER — Emergency Department (HOSPITAL_COMMUNITY): Payer: Medicare Other

## 2018-07-31 ENCOUNTER — Emergency Department (HOSPITAL_COMMUNITY)
Admission: EM | Admit: 2018-07-31 | Discharge: 2018-07-31 | Disposition: A | Discharge status: Home / Self Care | Payer: Medicare Other | Attending: Emergency Medicine | Admitting: Emergency Medicine

## 2018-07-31 ENCOUNTER — Other Ambulatory Visit: Payer: Self-pay

## 2018-07-31 DIAGNOSIS — I509 Heart failure, unspecified: Secondary | ICD-10-CM | POA: Insufficient documentation

## 2018-07-31 DIAGNOSIS — R519 Headache, unspecified: Secondary | ICD-10-CM

## 2018-07-31 DIAGNOSIS — I132 Hypertensive heart and chronic kidney disease with heart failure and with stage 5 chronic kidney disease, or end stage renal disease: Secondary | ICD-10-CM | POA: Insufficient documentation

## 2018-07-31 DIAGNOSIS — R110 Nausea: Secondary | ICD-10-CM | POA: Diagnosis not present

## 2018-07-31 DIAGNOSIS — R51 Headache: Secondary | ICD-10-CM | POA: Diagnosis not present

## 2018-07-31 DIAGNOSIS — N2581 Secondary hyperparathyroidism of renal origin: Secondary | ICD-10-CM | POA: Diagnosis not present

## 2018-07-31 DIAGNOSIS — Z79899 Other long term (current) drug therapy: Secondary | ICD-10-CM | POA: Insufficient documentation

## 2018-07-31 DIAGNOSIS — Z992 Dependence on renal dialysis: Secondary | ICD-10-CM | POA: Insufficient documentation

## 2018-07-31 DIAGNOSIS — R109 Unspecified abdominal pain: Secondary | ICD-10-CM

## 2018-07-31 DIAGNOSIS — D509 Iron deficiency anemia, unspecified: Secondary | ICD-10-CM | POA: Diagnosis not present

## 2018-07-31 DIAGNOSIS — R1031 Right lower quadrant pain: Secondary | ICD-10-CM | POA: Diagnosis not present

## 2018-07-31 DIAGNOSIS — N186 End stage renal disease: Secondary | ICD-10-CM | POA: Insufficient documentation

## 2018-07-31 DIAGNOSIS — Z7982 Long term (current) use of aspirin: Secondary | ICD-10-CM | POA: Insufficient documentation

## 2018-07-31 DIAGNOSIS — E1122 Type 2 diabetes mellitus with diabetic chronic kidney disease: Secondary | ICD-10-CM | POA: Insufficient documentation

## 2018-07-31 DIAGNOSIS — Z794 Long term (current) use of insulin: Secondary | ICD-10-CM | POA: Diagnosis not present

## 2018-07-31 DIAGNOSIS — R42 Dizziness and giddiness: Secondary | ICD-10-CM | POA: Diagnosis present

## 2018-07-31 DIAGNOSIS — Z96642 Presence of left artificial hip joint: Secondary | ICD-10-CM | POA: Insufficient documentation

## 2018-07-31 DIAGNOSIS — M5416 Radiculopathy, lumbar region: Secondary | ICD-10-CM

## 2018-07-31 DIAGNOSIS — D631 Anemia in chronic kidney disease: Secondary | ICD-10-CM | POA: Diagnosis not present

## 2018-07-31 DIAGNOSIS — I120 Hypertensive chronic kidney disease with stage 5 chronic kidney disease or end stage renal disease: Secondary | ICD-10-CM | POA: Diagnosis not present

## 2018-07-31 LAB — URINALYSIS, ROUTINE W REFLEX MICROSCOPIC
Bilirubin Urine: NEGATIVE
Glucose, UA: 150 mg/dL — AB
Ketones, ur: NEGATIVE mg/dL
Nitrite: NEGATIVE
Protein, ur: >=300 mg/dL — AB
Specific Gravity, Urine: >1.046 — ABNORMAL HIGH (ref 1.005–1.030)
WBC, UA: >50 WBC/hpf — ABNORMAL HIGH (ref 0–5)
pH: 5 (ref 5.0–8.0)

## 2018-07-31 LAB — GRAM STAIN: Gram Stain: NONE SEEN

## 2018-07-31 LAB — BASIC METABOLIC PANEL
Anion gap: 19 — ABNORMAL HIGH (ref 5–15)
BUN: 61 mg/dL — ABNORMAL HIGH (ref 6–20)
CO2: 25 mmol/L (ref 22–32)
Calcium: 8.5 mg/dL — ABNORMAL LOW (ref 8.9–10.3)
Chloride: 88 mmol/L — ABNORMAL LOW (ref 98–111)
Creatinine, Ser: 15.08 mg/dL — ABNORMAL HIGH (ref 0.44–1.00)
GFR calc Af Amer: 3 mL/min — ABNORMAL LOW (ref >60)
GFR calc non Af Amer: 2 mL/min — ABNORMAL LOW (ref >60)
Glucose, Bld: 138 mg/dL — ABNORMAL HIGH (ref 70–99)
Potassium: 4.3 mmol/L (ref 3.5–5.1)
Sodium: 132 mmol/L — ABNORMAL LOW (ref 135–145)

## 2018-07-31 LAB — CBC
HCT: 25.4 % — ABNORMAL LOW (ref 36.0–46.0)
Hemoglobin: 7.7 g/dL — ABNORMAL LOW (ref 12.0–15.0)
MCH: 24.9 pg — ABNORMAL LOW (ref 26.0–34.0)
MCHC: 30.3 g/dL (ref 30.0–36.0)
MCV: 82.2 fL (ref 80.0–100.0)
Platelets: 160 10*3/uL (ref 150–400)
RBC: 3.09 MIL/uL — ABNORMAL LOW (ref 3.87–5.11)
RDW: 18.5 % — ABNORMAL HIGH (ref 11.5–15.5)
WBC: 7.2 10*3/uL (ref 4.0–10.5)
nRBC: 0 % (ref 0.0–0.2)

## 2018-07-31 LAB — POC URINE PREG, ED: Preg Test, Ur: NEGATIVE

## 2018-07-31 LAB — GLUCOSE, PLEURAL OR PERITONEAL FLUID: Glucose, Fluid: 127 mg/dL

## 2018-07-31 LAB — PROTEIN, PLEURAL OR PERITONEAL FLUID: Total protein, fluid: <3 g/dL

## 2018-07-31 LAB — ALBUMIN, PLEURAL OR PERITONEAL FLUID?: Albumin, Fluid: <1 g/dL

## 2018-07-31 LAB — LACTATE DEHYDROGENASE, PLEURAL OR PERITONEAL FLUID: LD, Fluid: 9 U/L (ref 3–23)

## 2018-07-31 LAB — CBG MONITORING, ED: Glucose-Capillary: 133 mg/dL — ABNORMAL HIGH (ref 70–99)

## 2018-07-31 IMAGING — CR PORTABLE CHEST - 1 VIEW
1 series · 1 of 1 positions shown · non-contrast
Comparison: [DATE]

CLINICAL DATA: Headache and dizziness for 1 week

EXAM:
PORTABLE CHEST 1 VIEW

[portable]
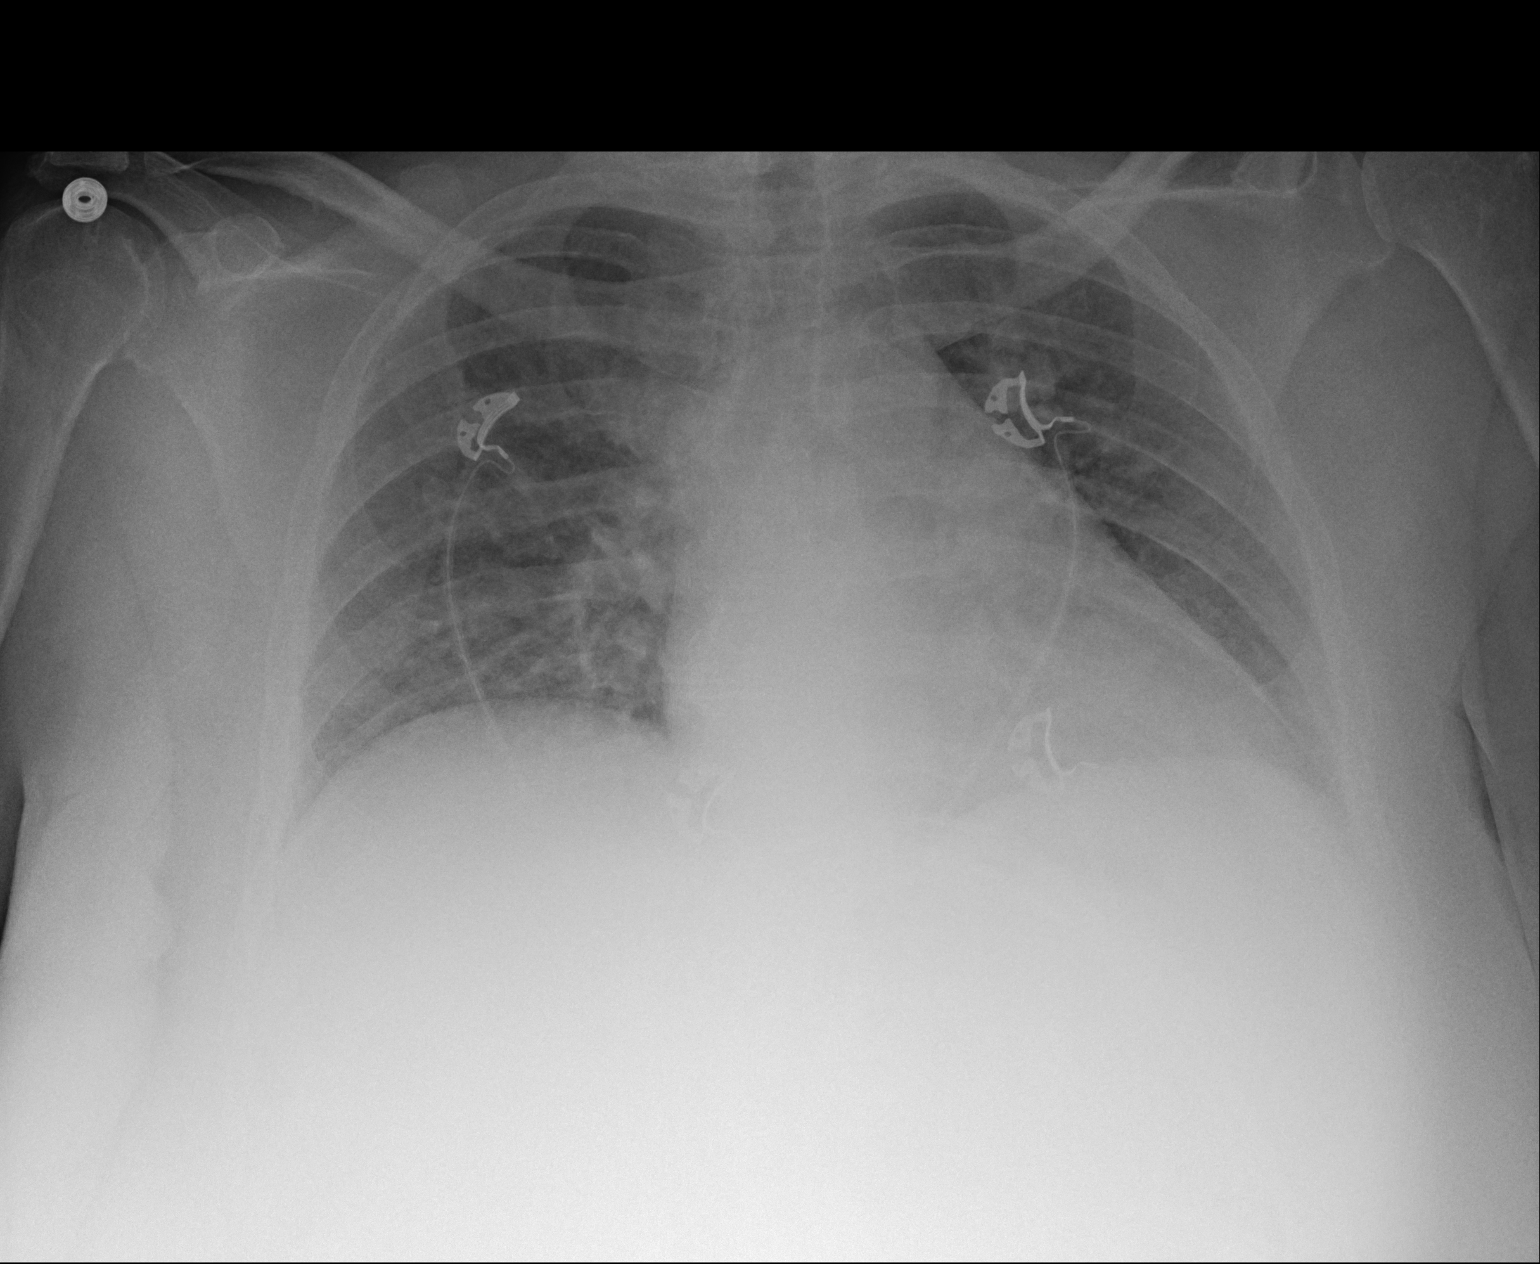

[1 of 1 positions shown; findings below may reference images not displayed]

FINDINGS: The mediastinal contours are within normal limits. The cardiac
silhouette is slightly prominent probably due to low lung volumes.
The lung volumes are low. Both lungs are clear. The visualized
skeletal structures are unremarkable.
IMPRESSION: No active cardiopulmonary disease. Low lung volumes which may
account for slightly prominent cardiac silhouette.

## 2018-07-31 MED ORDER — HYDROCODONE-ACETAMINOPHEN 5-325 MG PO TABS: 1.0000 | ORAL_TABLET | ORAL | 0 refills | Status: DC | PRN

## 2018-07-31 MED ORDER — ONDANSETRON 4 MG PO TBDP: 4.0000 mg | ORAL_TABLET | Freq: Three times a day (TID) | ORAL | 0 refills | Status: DC | PRN

## 2018-07-31 MED ORDER — SODIUM CHLORIDE 0.9% FLUSH: 3.0000 mL | Freq: Once | INTRAVENOUS | Status: DC

## 2018-07-31 NOTE — ED Provider Notes (Signed)
Janet Mitchell signed out by Dr. Eulis Foster.  I spoke with Janet Mitchell.  She describes an occasional headache that starts in her sinuses and goes into a "migraine."  She then vomits.  Her abd pain starts in her back and radiates down her leg.  No fevers.   UA does show some WBCS, but negative nitrite.  Mod LE.  Rare bacteria.  Urine will be sent for cx.  Peritoneal fluid shows no organisms.  This will also be sent for cx.  Janet Mitchell is feeling better now.  She does not have a headache and does not feel nauseous.   Janet Mitchell is instructed to return if worse.  F/u with pcp.   Isla Pence, MD 07/31/18 1929

## 2018-07-31 NOTE — ED Notes (Signed)
Patient is willing to do in and out catheter per MD request. RN will notify when patient is done with her dialysis.

## 2018-07-31 NOTE — ED Notes (Signed)
Per Wentz,MD pt does not need Peripheral iv at this time

## 2018-07-31 NOTE — ED Provider Notes (Signed)
[facility] EMERGENCY DEPARTMENT Provider Note   CSN: [id number] Arrival date & time: 07/31/18  1415    History   Chief Complaint Chief Complaint  Patient presents with  . Dizziness    HPI Janet Mitchell is a 53 y.o. female.     HPI   She presents for evaluation of pain in left flank rating to her left abdomen, persistent for 1 week.  She also has dizziness characterized by unsteadiness when standing for 2 days.  She states she has been eating well but has vomited twice a day for 2 days.  She denies diarrhea.  She denies fever, cough, shortness of breath, focal weakness or paresthesia.  She does dialysis daily, peritoneal, and has fluid in her abdomen dwelling at this time.  No other recent illnesses.  No known sick contacts.  There are no other known modifying factors.  Past Medical History:  Diagnosis Date  . Anemia   . CHF (congestive heart failure) ([place])   . Chronic cholecystitis with calculus   . ESRD (end stage renal disease) on dialysis [facility])    "peritoneal dialysis q hs" (03/09/2018)  . Headache    "a few/wk" (03/09/2018)  . History of blood transfusion 10/2017   "low blood count" (03/09/2018)  . Hypertension   . Spinal headache   . Type II diabetes mellitus [facility])     Patient Active Problem List   Diagnosis Date Noted  . Diarrhea 05/22/2018  . Fluid overload 03/25/2018  . Microcytic anemia 03/25/2018  . Chronic cholecystitis with calculus 03/09/2018  . Type II diabetes mellitus with renal manifestations ([place]) 03/09/2018  . Symptomatic anemia 11/06/2017  . Hypertension 11/06/2017  . ESRD on peritoneal dialysis ([place]) 11/06/2017    Past Surgical History:  Procedure Laterality Date  . AMPUTATION TOE Left 2013   Great toe  . CATARACT EXTRACTION W/ INTRAOCULAR LENS IMPLANT Right   . CESAREAN SECTION  1994; 1998  . CHOLECYSTECTOMY N/A 03/09/2018   Procedure: LAPAROSCOPIC CHOLECYSTECTOMY WITH INTRAOPERATIVE CHOLANGIOGRAM ERAS PATHWAY;  Surgeon: Donnie Mesa, MD;  Location: [place];  Service: General;  Laterality: N/A;  . EYE SURGERY  05/15/2018   Removal of blood in the globe (due to DM)  . FLEXIBLE SIGMOIDOSCOPY N/A 11/24/2017   Procedure: FLEXIBLE SIGMOIDOSCOPY;  Surgeon: Daneil Dolin, MD;  [facility];  Service: Endoscopy;  Laterality: N/A;  . JOINT REPLACEMENT    . LAPAROSCOPIC CHOLECYSTECTOMY  03/09/2018  . PERITONEAL CATHETER INSERTION  2017  . TOTAL HIP ARTHROPLASTY Left 1997     OB History    Gravida  2   Para  2   Term      Preterm      AB      Living  2     SAB      TAB      Ectopic      Multiple      Live Births               Home Medications    Prior to Admission medications   Medication Sig Start Date End Date Taking? Authorizing Provider  acetaminophen-codeine (TYLENOL #3) 300-30 MG tablet Take 1 tablet by mouth every 8 (eight) hours as needed for moderate pain.   Yes [provider]  ALPRAZolam Duanne Moron) 0.5 MG tablet Take 0.5 mg by mouth at bedtime as needed for anxiety.   Yes [provider]  amLODipine (NORVASC) 10 MG tablet Take 1 tablet (10 mg total)  by mouth daily. 11/09/17  Yes Emokpae, Courage, MD  aspirin EC 81 MG tablet Take 81 mg by mouth daily.    Yes [provider]  calcitRIOL (ROCALTROL) 0.5 MCG capsule Take 0.5-1 mcg by mouth See admin instructions. 0.75mcg on Mondays through Fridays and take 21mcg on Saturdays and Sundays   Yes [provider]  Crisaborole (EUCRISA) 2 % OINT Apply 1 application topically daily as needed (for rash/irritation).   Yes [provider]  doxazosin (CARDURA) 4 MG tablet Take 4 mg by mouth at bedtime.   Yes [provider]  Insulin Glargine (TOUJEO MAX SOLOSTAR) 300 UNIT/ML SOPN Inject 14 Units into the skin at bedtime. 11/09/17  Yes Emokpae, Courage, MD  labetalol (NORMODYNE) 200 MG tablet Take 200 mg by mouth 2 (two) times daily.   Yes [provider]  torsemide (DEMADEX) 100 MG  tablet Take 1 tablet (100 mg total) by mouth at bedtime. 11/09/17  Yes Emokpae, Courage, MD  ciprofloxacin (CIPRO) 500 MG tablet Take 1 tablet (500 mg total) by mouth daily. Patient not taking: Reported on 07/11/2018 06/26/18   Jonnie Kind, MD  medroxyPROGESTERone (PROVERA) 10 MG tablet Take 1 tablet (10 mg total) by mouth daily for 10 days. One tablet daily x 10d. Repeat as directed Patient not taking: Reported on 07/31/2018 06/27/18 07/07/18  Jonnie Kind, MD    Family History Family History  Problem Relation Age of Onset  . Heart disease Mother   . Thrombocytopenia Mother        TTP  . Heart failure Father   . Kidney disease Paternal Grandfather   . Colon cancer Neg Hx     Social History Social History   Tobacco Use  . Smoking status: Passive Smoke Exposure - Never Smoker  . Smokeless tobacco: Never Used  Substance Use Topics  . Alcohol use: Never    Frequency: Never  . Drug use: Never     Allergies   Patient has no known allergies.   Review of Systems Review of Systems  All other systems reviewed and are negative.    Physical Exam Updated Vital Signs BP (!) 158/78   Pulse 65   Temp 98.4 F (36.9 C) (Oral)   Resp 14   Ht 5\' 8"  (1.727 m)   Wt 117.9 kg   LMP 07/11/2018   SpO2 98%   BMI 39.53 kg/m   Physical Exam Vitals signs and nursing note reviewed.  Constitutional:      General: She is not in acute distress.    Appearance: She is well-developed. She is obese. She is not ill-appearing, toxic-appearing or diaphoretic.  HENT:     Head: Normocephalic and atraumatic.     Right Ear: External ear normal.     Left Ear: External ear normal.  Eyes:     Conjunctiva/sclera: Conjunctivae normal.     Pupils: Pupils are equal, round, and reactive to light.  Neck:     Musculoskeletal: Normal range of motion and neck supple.     Trachea: Phonation normal.  Cardiovascular:     Rate and Rhythm: Normal rate and regular rhythm.     Heart sounds: Normal heart  sounds.  Pulmonary:     Effort: Pulmonary effort is normal.     Breath sounds: Normal breath sounds.  Abdominal:     General: There is no distension.     Palpations: Abdomen is soft. There is no mass.     Tenderness: There is no abdominal tenderness.  There is no guarding.     Comments: Peritoneal dialysis catheter site, and appliance, in the right lower quadrant, appear normal.  Musculoskeletal: Normal range of motion.     Comments: Normal strength arms and legs bilaterally.  Skin:    General: Skin is warm and dry.  Neurological:     Mental Status: She is alert and oriented to person, place, and time.     Cranial Nerves: No cranial nerve deficit.     Sensory: No sensory deficit.     Motor: No abnormal muscle tone.     Coordination: Coordination normal.     Comments: No dysarthria, aphasia or nystagmus.  Psychiatric:        Mood and Affect: Mood normal.        Behavior: Behavior normal.        Thought Content: Thought content normal.        Judgment: Judgment normal.      ED Treatments / Results  Labs (all labs ordered are listed, but only abnormal results are displayed) Labs Reviewed  BASIC METABOLIC PANEL - Abnormal; Notable for the following components:      Result Value   Sodium 132 (*)    Chloride 88 (*)    Glucose, Bld 138 (*)    BUN 61 (*)    Creatinine, Ser 15.08 (*)    Calcium 8.5 (*)    GFR calc non Af Amer 2 (*)    GFR calc Af Amer 3 (*)    Anion gap 19 (*)    All other components within normal limits  CBC - Abnormal; Notable for the following components:   RBC 3.09 (*)    Hemoglobin 7.7 (*)    HCT 25.4 (*)    MCH 24.9 (*)    RDW 18.5 (*)    All other components within normal limits  CBG MONITORING, ED - Abnormal; Notable for the following components:   Glucose-Capillary 133 (*)    All other components within normal limits  BODY FLUID CULTURE  URINALYSIS, ROUTINE W REFLEX MICROSCOPIC  LACTATE DEHYDROGENASE, PLEURAL OR PERITONEAL FLUID  GLUCOSE,  PLEURAL OR PERITONEAL FLUID  PROTEIN, PLEURAL OR PERITONEAL FLUID  ALBUMIN, PLEURAL OR PERITONEAL FLUID  POC URINE PREG, ED    EKG None    Date: 07/31/18  Rate: 72  Rhythm: normal sinus rhythm  QRS Axis: left  PR and QT Intervals: normal  ST/T Wave abnormalities: nonspecific T wave changes  PR and QRS Conduction Disutrbances:none  Narrative Interpretation:   Old EKG Reviewed: unchanged    Radiology Dg Chest Port 1 View  Result Date: 07/31/2018 CLINICAL DATA:  Headache and dizziness for 1 week EXAM: PORTABLE CHEST 1 VIEW COMPARISON:  March 24, 2018 FINDINGS: The mediastinal contours are within normal limits. The cardiac silhouette is slightly prominent probably due to low lung volumes. The lung volumes are low. Both lungs are clear. The visualized skeletal structures are unremarkable. IMPRESSION: No active cardiopulmonary disease. Low lung volumes which may account for slightly prominent cardiac silhouette. Electronically Signed   By: Abelardo Diesel M.D.   On: 07/31/2018 15:21    Procedures Procedures (including critical care time)  Medications Ordered in ED Medications  sodium chloride flush (NS) 0.9 % injection 3 mL (has no administration in time range)     Initial Impression / Assessment and Plan / ED Course  I have reviewed the triage vital signs and the nursing notes.  Pertinent labs & imaging results that were available during my  care of the patient were reviewed by me and considered in my medical decision making (see chart for details).  Clinical Course as of Jul 31 1907  Tue Jul 31, 2018  1846 Normal except sodium low, chloride low, glucose high, BUN high, creatinine high, calcium low, GFR low  Basic metabolic panel(!) [EW]  1007 Normal except hemoglobin low  CBC(!) [EW]  1847 High  CBG monitoring, ED(!) [EW]  1849 No CHF or infiltrate, image reviewed by me.  DG Chest Port 1 View [EW]    Clinical Course User Index [EW] Daleen Bo, MD         Patient Vitals for the past 24 hrs:  BP Temp Temp src Pulse Resp SpO2 Height Weight  07/31/18 1900 (!) 158/78 - - 65 14 98 % - -  07/31/18 1830 (!) 168/80 - - 65 10 97 % - -  07/31/18 1800 (!) 163/90 - - 63 - 97 % - -  07/31/18 1737 (!) 163/85 - - 68 16 96 % - -  07/31/18 1730 (!) 163/85 - - 66 13 100 % - -  07/31/18 1700 (!) 149/66 - - - 11 - - -  07/31/18 1600 (!) 158/85 - - 66 14 91 % - -  07/31/18 1530 (!) 172/86 - - 69 11 97 % - -  07/31/18 1500 (!) 173/90 - - 70 15 95 % - -  07/31/18 1432 (!) 169/86 - - 71 13 100 % - -  07/31/18 1431 (!) 169/86 98.4 F (36.9 C) Oral 70 17 99 % - -  07/31/18 1425 - - - - - - 5\' 8"  (1.727 m) 117.9 kg     Medical Decision Making: Non-specific flank pain radiating to left abdomen, patient with peritoneal dialysis, requiring evaluation for intra-abdominal infection.  No acute metabolic instability.  Patient with chronic hyponatremia, hypochloremia, elevated BUN and creatinine.  These are all near baseline.  CBC with low hemoglobin but at baseline.  Urine and peritoneal fluid for infection pending at the time of relief.  CRITICAL CARE-no Performed by: Daleen Bo  Nursing Notes Reviewed/ Care Coordinated Applicable Imaging Reviewed Interpretation of Laboratory Data incorporated into ED treatment  Plan-disposition by oncoming provider team following return of testing.   Final Clinical Impressions(s) / ED Diagnoses   Final diagnoses:  Left flank pain    ED Discharge Orders    None       Daleen Bo, MD 07/31/18 1909

## 2018-07-31 NOTE — ED Triage Notes (Signed)
Pt presents to ED with complaints of dizziness and headache which started 1 week ago. Pt is a dialysis pt, last treatment was yesterday.

## 2018-08-01 DIAGNOSIS — N2581 Secondary hyperparathyroidism of renal origin: Secondary | ICD-10-CM | POA: Diagnosis not present

## 2018-08-01 DIAGNOSIS — D631 Anemia in chronic kidney disease: Secondary | ICD-10-CM | POA: Diagnosis not present

## 2018-08-01 DIAGNOSIS — Z992 Dependence on renal dialysis: Secondary | ICD-10-CM | POA: Diagnosis not present

## 2018-08-01 DIAGNOSIS — N186 End stage renal disease: Secondary | ICD-10-CM | POA: Diagnosis not present

## 2018-08-01 DIAGNOSIS — D509 Iron deficiency anemia, unspecified: Secondary | ICD-10-CM | POA: Diagnosis not present

## 2018-08-02 DIAGNOSIS — D631 Anemia in chronic kidney disease: Secondary | ICD-10-CM | POA: Diagnosis not present

## 2018-08-02 DIAGNOSIS — N186 End stage renal disease: Secondary | ICD-10-CM | POA: Diagnosis not present

## 2018-08-02 DIAGNOSIS — N2581 Secondary hyperparathyroidism of renal origin: Secondary | ICD-10-CM | POA: Diagnosis not present

## 2018-08-02 DIAGNOSIS — Z992 Dependence on renal dialysis: Secondary | ICD-10-CM | POA: Diagnosis not present

## 2018-08-02 DIAGNOSIS — D509 Iron deficiency anemia, unspecified: Secondary | ICD-10-CM | POA: Diagnosis not present

## 2018-08-03 DIAGNOSIS — D509 Iron deficiency anemia, unspecified: Secondary | ICD-10-CM | POA: Diagnosis not present

## 2018-08-03 DIAGNOSIS — N186 End stage renal disease: Secondary | ICD-10-CM | POA: Diagnosis not present

## 2018-08-03 DIAGNOSIS — N2581 Secondary hyperparathyroidism of renal origin: Secondary | ICD-10-CM | POA: Diagnosis not present

## 2018-08-03 DIAGNOSIS — D631 Anemia in chronic kidney disease: Secondary | ICD-10-CM | POA: Diagnosis not present

## 2018-08-03 DIAGNOSIS — Z992 Dependence on renal dialysis: Secondary | ICD-10-CM | POA: Diagnosis not present

## 2018-08-03 LAB — URINE CULTURE: Culture: >=100000 — AB

## 2018-08-04 DIAGNOSIS — Z992 Dependence on renal dialysis: Secondary | ICD-10-CM | POA: Diagnosis not present

## 2018-08-04 DIAGNOSIS — N2581 Secondary hyperparathyroidism of renal origin: Secondary | ICD-10-CM | POA: Diagnosis not present

## 2018-08-04 DIAGNOSIS — D631 Anemia in chronic kidney disease: Secondary | ICD-10-CM | POA: Diagnosis not present

## 2018-08-04 DIAGNOSIS — D509 Iron deficiency anemia, unspecified: Secondary | ICD-10-CM | POA: Diagnosis not present

## 2018-08-04 DIAGNOSIS — N186 End stage renal disease: Secondary | ICD-10-CM | POA: Diagnosis not present

## 2018-08-04 NOTE — Progress Notes (Signed)
ED Antimicrobial Stewardship Positive Culture Follow Up   Janet Mitchell is an 53 y.o. female who presented to [facility] on 07/31/2018 with a chief complaint of  Chief Complaint  Patient presents with  . Dizziness    Recent Results (from the past 720 hour(s))  Culture, body fluid-bottle     Status: None (Preliminary result)   Collection Time: 07/31/18  5:32 PM  Result Value Ref Range Status   Specimen Description PERITONEAL CAVITY  Final   Special Requests   Final    Blood Culture adequate volume BOTTLES DRAWN AEROBIC AND ANAEROBIC   Culture   Final    NO GROWTH 4 DAYS Performed at [facility], [address], [place], [place] [address]    Report Status PENDING  Incomplete  Gram stain     Status: None   Collection Time: 07/31/18  5:32 PM  Result Value Ref Range Status   Specimen Description PERITONEAL CAVITY  Final   Special Requests NONE  Final   Gram Stain   Final    NO ORGANISMS SEEN CYTOSPIN SMEAR Performed at [facility] WBC PRESENT,BOTH PMN AND MONONUCLEAR Performed at [facility], [address] [place], [place] [address]    Report Status 07/31/2018 FINAL  Final  Urine culture     Status: Abnormal   Collection Time: 07/31/18  6:00 PM  Result Value Ref Range Status   Specimen Description   Final    URINE, RANDOM Performed at [facility], [address], [place],  [address]    Special Requests   Final    NONE Performed at [facility], [address], [place],  [address]    Culture >=100,000 COLONIES/mL ENTEROCOCCUS FAECALIS (A)  Final   Report Status 08/03/2018 FINAL  Final   Organism ID, Bacteria ENTEROCOCCUS FAECALIS (A)  Final      Susceptibility   Enterococcus faecalis - MIC*    AMPICILLIN <=2 SENSITIVE Sensitive     LEVOFLOXACIN 2 SENSITIVE Sensitive     NITROFURANTOIN <=16 SENSITIVE Sensitive     VANCOMYCIN 1 SENSITIVE Sensitive     * >=100,000 COLONIES/mL ENTEROCOCCUS FAECALIS    [x]  Patient discharged originally without  antimicrobial agent and treatment is now indicated  New antibiotic prescription: Start amoxicillin 500mg  PO QHS x 10 days  ED Provider: Langston Masker, PA-C   Romona Curls 08/04/2018, 9:37 AM Clinical Pharmacist Monday - Friday phone -  [phone] Saturday - Sunday phone - [phone]

## 2018-08-05 ENCOUNTER — Telehealth: Payer: Self-pay | Admitting: Emergency Medicine

## 2018-08-05 DIAGNOSIS — N186 End stage renal disease: Secondary | ICD-10-CM | POA: Diagnosis not present

## 2018-08-05 DIAGNOSIS — N2581 Secondary hyperparathyroidism of renal origin: Secondary | ICD-10-CM | POA: Diagnosis not present

## 2018-08-05 DIAGNOSIS — D509 Iron deficiency anemia, unspecified: Secondary | ICD-10-CM | POA: Diagnosis not present

## 2018-08-05 DIAGNOSIS — D631 Anemia in chronic kidney disease: Secondary | ICD-10-CM | POA: Diagnosis not present

## 2018-08-05 DIAGNOSIS — Z992 Dependence on renal dialysis: Secondary | ICD-10-CM | POA: Diagnosis not present

## 2018-08-05 LAB — CULTURE, BODY FLUID W GRAM STAIN -BOTTLE
Culture: NO GROWTH
Special Requests: ADEQUATE

## 2018-08-05 NOTE — Telephone Encounter (Signed)
Post ED Visit - Positive Culture Follow-up: Successful Patient Follow-Up  Culture assessed and recommendations reviewed by:  []  Elenor Quinones, Pharm.D. []  Heide Guile, Pharm.D., BCPS AQ-ID []  Parks Neptune, Pharm.D., BCPS []  Alycia Rossetti, Pharm.D., BCPS []  [place], [place].D., BCPS, AAHIVP []  Legrand Como, Pharm.D., BCPS, AAHIVP []  Salome Arnt, PharmD, BCPS []  Johnnette Gourd, PharmD, BCPS []  Hughes Better, PharmD, BCPS [x]  Elicia Lamp, PharmD  Positive urine culture  [x]  Patient discharged without antimicrobial prescription and treatment is now indicated []  Organism is resistant to prescribed ED discharge antimicrobial []  Patient with positive blood cultures  Changes discussed with ED provider: Langston Masker PA  New antibiotic prescription Amoxicillin 500 mg Q 24H x 10 days Called to [facility] patient, date 08/05/2018, time [place] 08/05/2018, 3:20 PM

## 2018-08-06 DIAGNOSIS — N186 End stage renal disease: Secondary | ICD-10-CM | POA: Diagnosis not present

## 2018-08-06 DIAGNOSIS — D509 Iron deficiency anemia, unspecified: Secondary | ICD-10-CM | POA: Diagnosis not present

## 2018-08-06 DIAGNOSIS — D631 Anemia in chronic kidney disease: Secondary | ICD-10-CM | POA: Diagnosis not present

## 2018-08-06 DIAGNOSIS — N2581 Secondary hyperparathyroidism of renal origin: Secondary | ICD-10-CM | POA: Diagnosis not present

## 2018-08-06 DIAGNOSIS — Z992 Dependence on renal dialysis: Secondary | ICD-10-CM | POA: Diagnosis not present

## 2018-08-07 DIAGNOSIS — N2581 Secondary hyperparathyroidism of renal origin: Secondary | ICD-10-CM | POA: Diagnosis not present

## 2018-08-07 DIAGNOSIS — D631 Anemia in chronic kidney disease: Secondary | ICD-10-CM | POA: Diagnosis not present

## 2018-08-07 DIAGNOSIS — D509 Iron deficiency anemia, unspecified: Secondary | ICD-10-CM | POA: Diagnosis not present

## 2018-08-07 DIAGNOSIS — N186 End stage renal disease: Secondary | ICD-10-CM | POA: Diagnosis not present

## 2018-08-07 DIAGNOSIS — Z992 Dependence on renal dialysis: Secondary | ICD-10-CM | POA: Diagnosis not present

## 2018-08-08 DIAGNOSIS — N186 End stage renal disease: Secondary | ICD-10-CM | POA: Diagnosis not present

## 2018-08-08 DIAGNOSIS — D509 Iron deficiency anemia, unspecified: Secondary | ICD-10-CM | POA: Diagnosis not present

## 2018-08-08 DIAGNOSIS — Z992 Dependence on renal dialysis: Secondary | ICD-10-CM | POA: Diagnosis not present

## 2018-08-08 DIAGNOSIS — D631 Anemia in chronic kidney disease: Secondary | ICD-10-CM | POA: Diagnosis not present

## 2018-08-08 DIAGNOSIS — N2581 Secondary hyperparathyroidism of renal origin: Secondary | ICD-10-CM | POA: Diagnosis not present

## 2018-08-09 DIAGNOSIS — Z992 Dependence on renal dialysis: Secondary | ICD-10-CM | POA: Diagnosis not present

## 2018-08-09 DIAGNOSIS — D509 Iron deficiency anemia, unspecified: Secondary | ICD-10-CM | POA: Diagnosis not present

## 2018-08-09 DIAGNOSIS — N186 End stage renal disease: Secondary | ICD-10-CM | POA: Diagnosis not present

## 2018-08-09 DIAGNOSIS — N2581 Secondary hyperparathyroidism of renal origin: Secondary | ICD-10-CM | POA: Diagnosis not present

## 2018-08-09 DIAGNOSIS — D631 Anemia in chronic kidney disease: Secondary | ICD-10-CM | POA: Diagnosis not present

## 2018-08-10 DIAGNOSIS — N186 End stage renal disease: Secondary | ICD-10-CM | POA: Diagnosis not present

## 2018-08-10 DIAGNOSIS — N2581 Secondary hyperparathyroidism of renal origin: Secondary | ICD-10-CM | POA: Diagnosis not present

## 2018-08-10 DIAGNOSIS — D509 Iron deficiency anemia, unspecified: Secondary | ICD-10-CM | POA: Diagnosis not present

## 2018-08-10 DIAGNOSIS — D631 Anemia in chronic kidney disease: Secondary | ICD-10-CM | POA: Diagnosis not present

## 2018-08-10 DIAGNOSIS — Z992 Dependence on renal dialysis: Secondary | ICD-10-CM | POA: Diagnosis not present

## 2018-08-11 DIAGNOSIS — N186 End stage renal disease: Secondary | ICD-10-CM | POA: Diagnosis not present

## 2018-08-11 DIAGNOSIS — D509 Iron deficiency anemia, unspecified: Secondary | ICD-10-CM | POA: Diagnosis not present

## 2018-08-11 DIAGNOSIS — N2581 Secondary hyperparathyroidism of renal origin: Secondary | ICD-10-CM | POA: Diagnosis not present

## 2018-08-11 DIAGNOSIS — Z992 Dependence on renal dialysis: Secondary | ICD-10-CM | POA: Diagnosis not present

## 2018-08-11 DIAGNOSIS — D631 Anemia in chronic kidney disease: Secondary | ICD-10-CM | POA: Diagnosis not present

## 2018-08-12 DIAGNOSIS — D509 Iron deficiency anemia, unspecified: Secondary | ICD-10-CM | POA: Diagnosis not present

## 2018-08-12 DIAGNOSIS — Z992 Dependence on renal dialysis: Secondary | ICD-10-CM | POA: Diagnosis not present

## 2018-08-12 DIAGNOSIS — D631 Anemia in chronic kidney disease: Secondary | ICD-10-CM | POA: Diagnosis not present

## 2018-08-12 DIAGNOSIS — N186 End stage renal disease: Secondary | ICD-10-CM | POA: Diagnosis not present

## 2018-08-12 DIAGNOSIS — N2581 Secondary hyperparathyroidism of renal origin: Secondary | ICD-10-CM | POA: Diagnosis not present

## 2018-08-13 DIAGNOSIS — Z992 Dependence on renal dialysis: Secondary | ICD-10-CM | POA: Diagnosis not present

## 2018-08-13 DIAGNOSIS — N186 End stage renal disease: Secondary | ICD-10-CM | POA: Diagnosis not present

## 2018-08-13 DIAGNOSIS — N2581 Secondary hyperparathyroidism of renal origin: Secondary | ICD-10-CM | POA: Diagnosis not present

## 2018-08-13 DIAGNOSIS — D631 Anemia in chronic kidney disease: Secondary | ICD-10-CM | POA: Diagnosis not present

## 2018-08-13 DIAGNOSIS — D509 Iron deficiency anemia, unspecified: Secondary | ICD-10-CM | POA: Diagnosis not present

## 2018-08-14 DIAGNOSIS — Z992 Dependence on renal dialysis: Secondary | ICD-10-CM | POA: Diagnosis not present

## 2018-08-14 DIAGNOSIS — N186 End stage renal disease: Secondary | ICD-10-CM | POA: Diagnosis not present

## 2018-08-14 DIAGNOSIS — D631 Anemia in chronic kidney disease: Secondary | ICD-10-CM | POA: Diagnosis not present

## 2018-08-14 DIAGNOSIS — N2581 Secondary hyperparathyroidism of renal origin: Secondary | ICD-10-CM | POA: Diagnosis not present

## 2018-08-14 DIAGNOSIS — D509 Iron deficiency anemia, unspecified: Secondary | ICD-10-CM | POA: Diagnosis not present

## 2018-08-15 DIAGNOSIS — Z992 Dependence on renal dialysis: Secondary | ICD-10-CM | POA: Diagnosis not present

## 2018-08-15 DIAGNOSIS — D631 Anemia in chronic kidney disease: Secondary | ICD-10-CM | POA: Diagnosis not present

## 2018-08-15 DIAGNOSIS — N2581 Secondary hyperparathyroidism of renal origin: Secondary | ICD-10-CM | POA: Diagnosis not present

## 2018-08-15 DIAGNOSIS — D509 Iron deficiency anemia, unspecified: Secondary | ICD-10-CM | POA: Diagnosis not present

## 2018-08-15 DIAGNOSIS — N186 End stage renal disease: Secondary | ICD-10-CM | POA: Diagnosis not present

## 2018-08-16 DIAGNOSIS — D509 Iron deficiency anemia, unspecified: Secondary | ICD-10-CM | POA: Diagnosis not present

## 2018-08-16 DIAGNOSIS — N186 End stage renal disease: Secondary | ICD-10-CM | POA: Diagnosis not present

## 2018-08-16 DIAGNOSIS — N2581 Secondary hyperparathyroidism of renal origin: Secondary | ICD-10-CM | POA: Diagnosis not present

## 2018-08-16 DIAGNOSIS — Z992 Dependence on renal dialysis: Secondary | ICD-10-CM | POA: Diagnosis not present

## 2018-08-16 DIAGNOSIS — D631 Anemia in chronic kidney disease: Secondary | ICD-10-CM | POA: Diagnosis not present

## 2018-08-17 DIAGNOSIS — D631 Anemia in chronic kidney disease: Secondary | ICD-10-CM | POA: Diagnosis not present

## 2018-08-17 DIAGNOSIS — N186 End stage renal disease: Secondary | ICD-10-CM | POA: Diagnosis not present

## 2018-08-17 DIAGNOSIS — D509 Iron deficiency anemia, unspecified: Secondary | ICD-10-CM | POA: Diagnosis not present

## 2018-08-17 DIAGNOSIS — Z992 Dependence on renal dialysis: Secondary | ICD-10-CM | POA: Diagnosis not present

## 2018-08-17 DIAGNOSIS — N2581 Secondary hyperparathyroidism of renal origin: Secondary | ICD-10-CM | POA: Diagnosis not present

## 2018-08-18 DIAGNOSIS — Z992 Dependence on renal dialysis: Secondary | ICD-10-CM | POA: Diagnosis not present

## 2018-08-18 DIAGNOSIS — D631 Anemia in chronic kidney disease: Secondary | ICD-10-CM | POA: Diagnosis not present

## 2018-08-18 DIAGNOSIS — N186 End stage renal disease: Secondary | ICD-10-CM | POA: Diagnosis not present

## 2018-08-18 DIAGNOSIS — N2581 Secondary hyperparathyroidism of renal origin: Secondary | ICD-10-CM | POA: Diagnosis not present

## 2018-08-18 DIAGNOSIS — D509 Iron deficiency anemia, unspecified: Secondary | ICD-10-CM | POA: Diagnosis not present

## 2018-08-19 DIAGNOSIS — D509 Iron deficiency anemia, unspecified: Secondary | ICD-10-CM | POA: Diagnosis not present

## 2018-08-19 DIAGNOSIS — N2581 Secondary hyperparathyroidism of renal origin: Secondary | ICD-10-CM | POA: Diagnosis not present

## 2018-08-19 DIAGNOSIS — D631 Anemia in chronic kidney disease: Secondary | ICD-10-CM | POA: Diagnosis not present

## 2018-08-19 DIAGNOSIS — N186 End stage renal disease: Secondary | ICD-10-CM | POA: Diagnosis not present

## 2018-08-19 DIAGNOSIS — Z992 Dependence on renal dialysis: Secondary | ICD-10-CM | POA: Diagnosis not present

## 2018-08-20 DIAGNOSIS — D509 Iron deficiency anemia, unspecified: Secondary | ICD-10-CM | POA: Diagnosis not present

## 2018-08-20 DIAGNOSIS — D631 Anemia in chronic kidney disease: Secondary | ICD-10-CM | POA: Diagnosis not present

## 2018-08-20 DIAGNOSIS — N186 End stage renal disease: Secondary | ICD-10-CM | POA: Diagnosis not present

## 2018-08-20 DIAGNOSIS — Z992 Dependence on renal dialysis: Secondary | ICD-10-CM | POA: Diagnosis not present

## 2018-08-20 DIAGNOSIS — N2581 Secondary hyperparathyroidism of renal origin: Secondary | ICD-10-CM | POA: Diagnosis not present

## 2018-08-21 DIAGNOSIS — Z992 Dependence on renal dialysis: Secondary | ICD-10-CM | POA: Diagnosis not present

## 2018-08-21 DIAGNOSIS — N186 End stage renal disease: Secondary | ICD-10-CM | POA: Diagnosis not present

## 2018-08-21 DIAGNOSIS — D631 Anemia in chronic kidney disease: Secondary | ICD-10-CM | POA: Diagnosis not present

## 2018-08-21 DIAGNOSIS — D509 Iron deficiency anemia, unspecified: Secondary | ICD-10-CM | POA: Diagnosis not present

## 2018-08-21 DIAGNOSIS — N2581 Secondary hyperparathyroidism of renal origin: Secondary | ICD-10-CM | POA: Diagnosis not present

## 2018-08-22 DIAGNOSIS — Z992 Dependence on renal dialysis: Secondary | ICD-10-CM | POA: Diagnosis not present

## 2018-08-22 DIAGNOSIS — D631 Anemia in chronic kidney disease: Secondary | ICD-10-CM | POA: Diagnosis not present

## 2018-08-22 DIAGNOSIS — D509 Iron deficiency anemia, unspecified: Secondary | ICD-10-CM | POA: Diagnosis not present

## 2018-08-22 DIAGNOSIS — N2581 Secondary hyperparathyroidism of renal origin: Secondary | ICD-10-CM | POA: Diagnosis not present

## 2018-08-22 DIAGNOSIS — N186 End stage renal disease: Secondary | ICD-10-CM | POA: Diagnosis not present

## 2018-08-23 DIAGNOSIS — D631 Anemia in chronic kidney disease: Secondary | ICD-10-CM | POA: Diagnosis not present

## 2018-08-23 DIAGNOSIS — D509 Iron deficiency anemia, unspecified: Secondary | ICD-10-CM | POA: Diagnosis not present

## 2018-08-23 DIAGNOSIS — Z992 Dependence on renal dialysis: Secondary | ICD-10-CM | POA: Diagnosis not present

## 2018-08-23 DIAGNOSIS — N186 End stage renal disease: Secondary | ICD-10-CM | POA: Diagnosis not present

## 2018-08-23 DIAGNOSIS — N2581 Secondary hyperparathyroidism of renal origin: Secondary | ICD-10-CM | POA: Diagnosis not present

## 2018-08-24 DIAGNOSIS — Z992 Dependence on renal dialysis: Secondary | ICD-10-CM | POA: Diagnosis not present

## 2018-08-24 DIAGNOSIS — N2581 Secondary hyperparathyroidism of renal origin: Secondary | ICD-10-CM | POA: Diagnosis not present

## 2018-08-24 DIAGNOSIS — D509 Iron deficiency anemia, unspecified: Secondary | ICD-10-CM | POA: Diagnosis not present

## 2018-08-24 DIAGNOSIS — N186 End stage renal disease: Secondary | ICD-10-CM | POA: Diagnosis not present

## 2018-08-24 DIAGNOSIS — D631 Anemia in chronic kidney disease: Secondary | ICD-10-CM | POA: Diagnosis not present

## 2018-08-25 DIAGNOSIS — N2581 Secondary hyperparathyroidism of renal origin: Secondary | ICD-10-CM | POA: Diagnosis not present

## 2018-08-25 DIAGNOSIS — Z992 Dependence on renal dialysis: Secondary | ICD-10-CM | POA: Diagnosis not present

## 2018-08-25 DIAGNOSIS — N186 End stage renal disease: Secondary | ICD-10-CM | POA: Diagnosis not present

## 2018-08-25 DIAGNOSIS — D631 Anemia in chronic kidney disease: Secondary | ICD-10-CM | POA: Diagnosis not present

## 2018-08-25 DIAGNOSIS — D509 Iron deficiency anemia, unspecified: Secondary | ICD-10-CM | POA: Diagnosis not present

## 2018-08-26 DIAGNOSIS — Z992 Dependence on renal dialysis: Secondary | ICD-10-CM | POA: Diagnosis not present

## 2018-08-26 DIAGNOSIS — N2581 Secondary hyperparathyroidism of renal origin: Secondary | ICD-10-CM | POA: Diagnosis not present

## 2018-08-26 DIAGNOSIS — N186 End stage renal disease: Secondary | ICD-10-CM | POA: Diagnosis not present

## 2018-08-26 DIAGNOSIS — D631 Anemia in chronic kidney disease: Secondary | ICD-10-CM | POA: Diagnosis not present

## 2018-08-26 DIAGNOSIS — D509 Iron deficiency anemia, unspecified: Secondary | ICD-10-CM | POA: Diagnosis not present

## 2018-08-27 ENCOUNTER — Encounter: Payer: Self-pay | Admitting: Gastroenterology

## 2018-08-27 ENCOUNTER — Other Ambulatory Visit: Payer: Self-pay

## 2018-08-27 ENCOUNTER — Ambulatory Visit (INDEPENDENT_AMBULATORY_CARE_PROVIDER_SITE_OTHER): Payer: Medicare Other | Admitting: Gastroenterology

## 2018-08-27 VITALS — BP 165/82 | HR 75 | Temp 97.6°F | Ht 68.0 in | Wt 256.8 lb

## 2018-08-27 DIAGNOSIS — K625 Hemorrhage of anus and rectum: Secondary | ICD-10-CM

## 2018-08-27 DIAGNOSIS — N186 End stage renal disease: Secondary | ICD-10-CM | POA: Diagnosis not present

## 2018-08-27 DIAGNOSIS — D631 Anemia in chronic kidney disease: Secondary | ICD-10-CM | POA: Diagnosis not present

## 2018-08-27 DIAGNOSIS — R208 Other disturbances of skin sensation: Secondary | ICD-10-CM | POA: Insufficient documentation

## 2018-08-27 DIAGNOSIS — R197 Diarrhea, unspecified: Secondary | ICD-10-CM

## 2018-08-27 DIAGNOSIS — D509 Iron deficiency anemia, unspecified: Secondary | ICD-10-CM | POA: Diagnosis not present

## 2018-08-27 DIAGNOSIS — Z992 Dependence on renal dialysis: Secondary | ICD-10-CM | POA: Diagnosis not present

## 2018-08-27 MED ORDER — HYDROCORTISONE (PERIANAL) 2.5 % EX CREA: 1.0000 "application " | TOPICAL_CREAM | Freq: Two times a day (BID) | CUTANEOUS | 3 refills | Status: DC

## 2018-08-27 MED ORDER — CHOLESTYRAMINE 4 G PO PACK: 4.0000 g | PACK | Freq: Every day | ORAL | 3 refills | Status: DC

## 2018-08-27 NOTE — Patient Instructions (Addendum)
1. Proceed with colonoscopy as scheduled.   2. I have prescribed cholestyramine to help with your diarrhea. Start with taking 4 grams per day. You may still take Imodium as needed.   3. I have also prescribed Anusol Cream. You may apply this per your rectum to help with the burning sensation after diarrhea.   4. Follow-up in 2-3 months.   Please call in 1 week to let me know how the cholestyramine is working.  If you notice any worsening bleeding, become lightheaded, or dizzy, you should go to the emergency room.   Aliene Altes, PA-C [facility] Gastroenterology

## 2018-08-27 NOTE — Assessment & Plan Note (Addendum)
Patient has had diarrhea 3-5 times daily since cholecystectomy in December 2019 now reporting light pink only on the toilet tissue with each occurrence of diarrhea for at least 1 month. No bright red blood per rectum. No blood in the toilet water or mixed in the stool. This is likely due to chronic diarrhea causing rectal irritation. Hopefully, decreasing the frequency of stools with the cholestyramine will help with this. Patient is chronically anemic with ESRD. Last hemaglobin on file was 7.7 on 5/5 which is at her baseline as she lives in the 7-8 range. I have given the patient strict precautions to go to the emergency room if she starts having bright red blood per rectum, or increase in the amount of blood that she is currently experiencing with increased lightheadedness or dizziness.

## 2018-08-27 NOTE — Progress Notes (Signed)
Referring Provider: Lucianne Lei, MD Primary Care Physician:  Lucianne Lei, MD  Primary GI Physician: Dr. Gala Romney  Chief Complaint  Patient presents with   Diarrhea    HPI:    Janet Mitchell is a 53 y.o. female presenting today with past medical history significant for GERD, ESRD on peritoneal dialysis, anemia, type 2 diabetes, and CHF.   Last seen in the office on 05/22/18 to reschedule her colonoscopy after a poor prep and diarrhea post cholecystectomy in December 2019. Colonoscopy on the schedule for 10/24/18. Diarrhea to be reevaluated after 6 months post cholecystectomy.   Recently seen in the ED with left flank pain radiating to her abdomen. Urine culture positive and she completed 10 days of Amoxicillin starting on 08/05/18. Peritoneal fluid had no growth.  Today she states she has persistent diarrhea with urgency since her cholecystectomy in December 2019. Typically 3-5 orange oily BMs per day. She also has some light pink on the toilet tissue after a bowel movement that started at least 1 month ago. Typically present every time she has diarrhea. No bright red blood noted. No blood in the toilet water. She notes a definite association with eating as BMs typically within 10 minutes of eating, but may also occur without eating. She is now wearing depends regularly as she has trouble getting to the bathroom in time and has messed up her clothes and bed sheets multiple times. Nervous about going out in public. Abdominal pain before she has to go to the bathroom and improves after BM. States "it's like a warning sign." Burning in rectum after diarrhea. No sharp rectal pain.  Takes multiple doses of Imodium with minimal relief.    Admits nausea and vomiting. Nausea often starts after her peritoneal dialysis in the morning. Vomiting occasionally. No hematemesis. No melena. No other GI symptoms.   Admits to feeling lightheaded occasionally with position change. This has been going on for  "a while" and is unchanged. Denies syncope. BP elevated today.   She has been trying to pursue a kidney transplant. She goes this Friday to restart testing.    Past Medical History:  Diagnosis Date   Anemia    CHF (congestive heart failure) (H[facility])    Chronic cholecystitis with calculus    ESRD (end stage renal disease) on dialysis ([place])    "peritoneal dialysis q hs" (03/09/2018)   Headache    "a few/wk" (03/09/2018)   History of blood transfusion 10/2017   "low blood count" (03/09/2018)   Hypertension    Spinal headache    Type II diabetes mellitus ([place])     Past Surgical History:  Procedure Laterality Date   AMPUTATION TOE Left 2013   Great toe   CATARACT EXTRACTION W/ INTRAOCULAR LENS IMPLANT Right    CESAREAN SECTION  1994; 1998   CHOLECYSTECTOMY N/A 03/09/2018   Procedure: LAPAROSCOPIC CHOLECYSTECTOMY WITH INTRAOPERATIVE CHOLANGIOGRAM ERAS PATHWAY;  Surgeon: Donnie Mesa, MD;  Location: [place];  Service: General;  Laterality: N/A;   EYE SURGERY  05/15/2018   Removal of blood in the globe (due to DM)   FLEXIBLE SIGMOIDOSCOPY N/A 11/24/2017   Procedure: FLEXIBLE SIGMOIDOSCOPY;  Surgeon: Daneil Dolin, MD;  [facility];  Service: Endoscopy;  Laterality: N/A;   JOINT REPLACEMENT     LAPAROSCOPIC CHOLECYSTECTOMY  03/09/2018   PERITONEAL CATHETER INSERTION  2017   TOTAL HIP ARTHROPLASTY Left 1997    Current Outpatient Medications  Medication Sig Dispense Refill   acetaminophen-codeine (TYLENOL #  3) 300-30 MG tablet Take 1 tablet by mouth every 8 (eight) hours as needed for moderate pain.     ALPRAZolam (XANAX) 0.5 MG tablet Take 0.5 mg by mouth at bedtime as needed for anxiety.     amLODipine (NORVASC) 10 MG tablet Take 1 tablet (10 mg total) by mouth daily. 30 tablet 3   aspirin EC 81 MG tablet Take 81 mg by mouth daily.      calcitRIOL (ROCALTROL) 0.5 MCG capsule Take 0.5-1 mcg by mouth See admin instructions. 0.68mcg on Mondays through  Fridays and take 46mcg on Saturdays and Sundays     Crisaborole (EUCRISA) 2 % OINT Apply 1 application topically daily as needed (for rash/irritation).     doxazosin (CARDURA) 4 MG tablet Take 4 mg by mouth at bedtime.     Insulin Glargine (TOUJEO MAX SOLOSTAR) 300 UNIT/ML SOPN Inject 14 Units into the skin at bedtime. 3 pen 3   labetalol (NORMODYNE) 200 MG tablet Take 200 mg by mouth 2 (two) times daily.     ondansetron (ZOFRAN ODT) 4 MG disintegrating tablet Take 1 tablet (4 mg total) by mouth every 8 (eight) hours as needed. 10 tablet 0   torsemide (DEMADEX) 100 MG tablet Take 1 tablet (100 mg total) by mouth at bedtime. 30 tablet 3   cholestyramine (QUESTRAN) 4 g packet Take 1 packet (4 g total) by mouth daily before breakfast. 60 each 3   hydrocortisone (ANUSOL-HC) 2.5 % rectal cream Place 1 application rectally 2 (two) times daily. 30 g 3   medroxyPROGESTERone (PROVERA) 10 MG tablet Take 1 tablet (10 mg total) by mouth daily for 10 days. One tablet daily x 10d. Repeat as directed (Patient not taking: Reported on 07/31/2018) 10 tablet 0   No current facility-administered medications for this visit.     Allergies as of 08/27/2018   (No Known Allergies)    Family History  Problem Relation Age of Onset   Heart disease Mother    Thrombocytopenia Mother        TTP   Heart failure Father    Kidney disease Paternal Grandfather    Colon cancer Neg Hx     Social History   Socioeconomic History   Marital status: Married    Spouse name: Not on file   Number of children: 2   Years of education: Not on file   Highest education level: Not on file  Occupational History   Not on file  Social Needs   Financial resource strain: Not on file   Food insecurity:    Worry: Not on file    Inability: Not on file   Transportation needs:    Medical: Not on file    Non-medical: Not on file  Tobacco Use   Smoking status: Passive Smoke Exposure - Never Smoker   Smokeless  tobacco: Never Used  Substance and Sexual Activity   Alcohol use: Never    Frequency: Never   Drug use: Never   Sexual activity: Yes    Birth control/protection: None  Lifestyle   Physical activity:    Days per week: Not on file    Minutes per session: Not on file   Stress: Not on file  Relationships   Social connections:    Talks on phone: Not on file    Gets together: Not on file    Attends religious service: Not on file    Active member of club or organization: Not on file    Attends meetings  of clubs or organizations: Not on file    Relationship status: Not on file  Other Topics Concern   Not on file  Social History Narrative   Not on file    Review of Systems: Gen: Denies fever, chills, and unintentional weight loss. Admits to lack of energy but unchanged from baseline.  CV: Denies chest pain, palpitations, syncope.  Resp: Denies dyspnea at rest, cough. GI: See HPI Derm: Denies rash, itching, dry skin Heme: See HPI  Physical Exam: BP (!) 165/82    Pulse 75    Temp 97.6 F (36.4 C) (Oral)    Ht 5\' 8"  (1.727 m)    Wt 256 lb 12.8 oz (116.5 kg)    LMP 07/31/2018 (Approximate)    BMI 39.05 kg/m  General:   Alert and oriented. No distress noted. Pleasant and cooperative.  Head:  Normocephalic and atraumatic. Eyes:  Conjuctiva clear without scleral icterus.  Mouth:  Oral mucosa pink and moist.  Heart: S1, S2 present. Regular rate and rhythm. No murmurs or gallops noted. Lungs: Clear to auscultation bilaterally. No wheezing or crackles noted.  Abdomen:  +BS, soft, minimal tenderness to palpation diffusely. Non-distended. No rebound or guarding. No HSM or masses noted. Msk:  Symmetrical without gross deformities.  Extremities:  Positive lower extremity edema. Neurologic:  Alert and  oriented x4 Psych:  Alert and cooperative. Normal mood and affect.

## 2018-08-27 NOTE — Assessment & Plan Note (Signed)
This is likely from chronic diarrhea causing rectal irritation. I have prescribed Anusol Cream to help with this.

## 2018-08-27 NOTE — Assessment & Plan Note (Signed)
Patient continues to have persistent diarrhea with urgency post laparoscopic cholecystectomy in December 2019. Tried Imodium without significant improvement. We are now 6 months post cholecystectomy and there has been no improvement of symptoms. I have prescribed cholestyramine to help with her diarrhea. We will start at the low dose, 4 grams daily before breakfast. Patient is to call in 1 week to let me know if this has helped. We may need to increase dosing if inadequate relief. She may also continue Imodium as needed.   Proceed with TCS with Dr. Gala Romney as already scheduled for 10/24/18. Reviewed risks and benefits again at this visit. Patient states her understanding and desires to proceed. Minimal conscious sedation should be appropriate. Patient is taking Xanax 0.5 mg as needed at bedtime. No chronic pain medications, no anticoagulants, no other anxiolytics or antidepressants.

## 2018-08-28 ENCOUNTER — Encounter: Payer: Self-pay | Admitting: Internal Medicine

## 2018-08-28 ENCOUNTER — Other Ambulatory Visit: Payer: Self-pay | Admitting: Gastroenterology

## 2018-08-28 ENCOUNTER — Telehealth: Payer: Self-pay | Admitting: Gastroenterology

## 2018-08-28 ENCOUNTER — Telehealth: Payer: Self-pay | Admitting: Internal Medicine

## 2018-08-28 DIAGNOSIS — D631 Anemia in chronic kidney disease: Secondary | ICD-10-CM | POA: Diagnosis not present

## 2018-08-28 DIAGNOSIS — N186 End stage renal disease: Secondary | ICD-10-CM | POA: Diagnosis not present

## 2018-08-28 DIAGNOSIS — Z992 Dependence on renal dialysis: Secondary | ICD-10-CM | POA: Diagnosis not present

## 2018-08-28 DIAGNOSIS — D509 Iron deficiency anemia, unspecified: Secondary | ICD-10-CM | POA: Diagnosis not present

## 2018-08-28 MED ORDER — COLESTIPOL HCL 1 G PO TABS: 1.0000 g | ORAL_TABLET | Freq: Two times a day (BID) | ORAL | 3 refills | Status: DC

## 2018-08-28 NOTE — Telephone Encounter (Signed)
I spoke with Janet Mitchell this morning. Due to possible hyperchloremic acidosis (noted on Up to Date) with cholestyramine in patients with renal impairment, I have changed her prescription to Colestipol 1g BID. This does not carry the same warning. Discussed with Janet Mitchell. She had not taken cholestyramine yet as she had not picked it up from the pharmacy. I sent in Colestipol to [name] this morning and also called the pharmacy to have cholestyramine canceled.   Patient is still to call in a couple weeks to let us know how she is doing.

## 2018-08-28 NOTE — Telephone Encounter (Signed)
Elmo Putt, I have reached out to Janet Mitchell this morning. I called the house phone with no answer. Called mobile, and her husband Alphony answered. He was not with her but should be home within the next hour. He is going to have her call the office.   Please contact me when she calls back. I am changing her cholestyramine to Colestipol due to some question about dosing in renal impairment.

## 2018-08-28 NOTE — Progress Notes (Signed)
cc'ed to pcp °

## 2018-08-28 NOTE — Telephone Encounter (Signed)
Noted  

## 2018-08-28 NOTE — Telephone Encounter (Signed)
Pt's was returning a call from [facility] about her medication. Please call [phone]

## 2018-08-29 DIAGNOSIS — N186 End stage renal disease: Secondary | ICD-10-CM | POA: Diagnosis not present

## 2018-08-29 DIAGNOSIS — D509 Iron deficiency anemia, unspecified: Secondary | ICD-10-CM | POA: Diagnosis not present

## 2018-08-29 DIAGNOSIS — Z992 Dependence on renal dialysis: Secondary | ICD-10-CM | POA: Diagnosis not present

## 2018-08-29 DIAGNOSIS — D631 Anemia in chronic kidney disease: Secondary | ICD-10-CM | POA: Diagnosis not present

## 2018-08-30 DIAGNOSIS — D631 Anemia in chronic kidney disease: Secondary | ICD-10-CM | POA: Diagnosis not present

## 2018-08-30 DIAGNOSIS — D509 Iron deficiency anemia, unspecified: Secondary | ICD-10-CM | POA: Diagnosis not present

## 2018-08-30 DIAGNOSIS — N186 End stage renal disease: Secondary | ICD-10-CM | POA: Diagnosis not present

## 2018-08-30 DIAGNOSIS — Z992 Dependence on renal dialysis: Secondary | ICD-10-CM | POA: Diagnosis not present

## 2018-08-31 DIAGNOSIS — Z992 Dependence on renal dialysis: Secondary | ICD-10-CM | POA: Diagnosis not present

## 2018-08-31 DIAGNOSIS — D509 Iron deficiency anemia, unspecified: Secondary | ICD-10-CM | POA: Diagnosis not present

## 2018-08-31 DIAGNOSIS — N186 End stage renal disease: Secondary | ICD-10-CM | POA: Diagnosis not present

## 2018-08-31 DIAGNOSIS — D631 Anemia in chronic kidney disease: Secondary | ICD-10-CM | POA: Diagnosis not present

## 2018-09-01 DIAGNOSIS — D631 Anemia in chronic kidney disease: Secondary | ICD-10-CM | POA: Diagnosis not present

## 2018-09-01 DIAGNOSIS — N186 End stage renal disease: Secondary | ICD-10-CM | POA: Diagnosis not present

## 2018-09-01 DIAGNOSIS — D509 Iron deficiency anemia, unspecified: Secondary | ICD-10-CM | POA: Diagnosis not present

## 2018-09-01 DIAGNOSIS — Z992 Dependence on renal dialysis: Secondary | ICD-10-CM | POA: Diagnosis not present

## 2018-09-02 DIAGNOSIS — N186 End stage renal disease: Secondary | ICD-10-CM | POA: Diagnosis not present

## 2018-09-02 DIAGNOSIS — D631 Anemia in chronic kidney disease: Secondary | ICD-10-CM | POA: Diagnosis not present

## 2018-09-02 DIAGNOSIS — D509 Iron deficiency anemia, unspecified: Secondary | ICD-10-CM | POA: Diagnosis not present

## 2018-09-02 DIAGNOSIS — Z992 Dependence on renal dialysis: Secondary | ICD-10-CM | POA: Diagnosis not present

## 2018-09-03 DIAGNOSIS — N186 End stage renal disease: Secondary | ICD-10-CM | POA: Diagnosis not present

## 2018-09-03 DIAGNOSIS — D631 Anemia in chronic kidney disease: Secondary | ICD-10-CM | POA: Diagnosis not present

## 2018-09-03 DIAGNOSIS — D509 Iron deficiency anemia, unspecified: Secondary | ICD-10-CM | POA: Diagnosis not present

## 2018-09-03 DIAGNOSIS — Z992 Dependence on renal dialysis: Secondary | ICD-10-CM | POA: Diagnosis not present

## 2018-09-04 DIAGNOSIS — D631 Anemia in chronic kidney disease: Secondary | ICD-10-CM | POA: Diagnosis not present

## 2018-09-04 DIAGNOSIS — N186 End stage renal disease: Secondary | ICD-10-CM | POA: Diagnosis not present

## 2018-09-04 DIAGNOSIS — Z992 Dependence on renal dialysis: Secondary | ICD-10-CM | POA: Diagnosis not present

## 2018-09-04 DIAGNOSIS — D509 Iron deficiency anemia, unspecified: Secondary | ICD-10-CM | POA: Diagnosis not present

## 2018-09-05 DIAGNOSIS — D631 Anemia in chronic kidney disease: Secondary | ICD-10-CM | POA: Diagnosis not present

## 2018-09-05 DIAGNOSIS — Z992 Dependence on renal dialysis: Secondary | ICD-10-CM | POA: Diagnosis not present

## 2018-09-05 DIAGNOSIS — D509 Iron deficiency anemia, unspecified: Secondary | ICD-10-CM | POA: Diagnosis not present

## 2018-09-05 DIAGNOSIS — N186 End stage renal disease: Secondary | ICD-10-CM | POA: Diagnosis not present

## 2018-09-06 DIAGNOSIS — N186 End stage renal disease: Secondary | ICD-10-CM | POA: Diagnosis not present

## 2018-09-06 DIAGNOSIS — D631 Anemia in chronic kidney disease: Secondary | ICD-10-CM | POA: Diagnosis not present

## 2018-09-06 DIAGNOSIS — D509 Iron deficiency anemia, unspecified: Secondary | ICD-10-CM | POA: Diagnosis not present

## 2018-09-06 DIAGNOSIS — Z992 Dependence on renal dialysis: Secondary | ICD-10-CM | POA: Diagnosis not present

## 2018-09-07 DIAGNOSIS — D631 Anemia in chronic kidney disease: Secondary | ICD-10-CM | POA: Diagnosis not present

## 2018-09-07 DIAGNOSIS — D509 Iron deficiency anemia, unspecified: Secondary | ICD-10-CM | POA: Diagnosis not present

## 2018-09-07 DIAGNOSIS — Z992 Dependence on renal dialysis: Secondary | ICD-10-CM | POA: Diagnosis not present

## 2018-09-07 DIAGNOSIS — N186 End stage renal disease: Secondary | ICD-10-CM | POA: Diagnosis not present

## 2018-09-08 DIAGNOSIS — D509 Iron deficiency anemia, unspecified: Secondary | ICD-10-CM | POA: Diagnosis not present

## 2018-09-08 DIAGNOSIS — N186 End stage renal disease: Secondary | ICD-10-CM | POA: Diagnosis not present

## 2018-09-08 DIAGNOSIS — Z992 Dependence on renal dialysis: Secondary | ICD-10-CM | POA: Diagnosis not present

## 2018-09-08 DIAGNOSIS — D631 Anemia in chronic kidney disease: Secondary | ICD-10-CM | POA: Diagnosis not present

## 2018-09-09 DIAGNOSIS — N186 End stage renal disease: Secondary | ICD-10-CM | POA: Diagnosis not present

## 2018-09-09 DIAGNOSIS — D631 Anemia in chronic kidney disease: Secondary | ICD-10-CM | POA: Diagnosis not present

## 2018-09-09 DIAGNOSIS — D509 Iron deficiency anemia, unspecified: Secondary | ICD-10-CM | POA: Diagnosis not present

## 2018-09-09 DIAGNOSIS — Z992 Dependence on renal dialysis: Secondary | ICD-10-CM | POA: Diagnosis not present

## 2018-09-10 ENCOUNTER — Ambulatory Visit: Payer: Medicare Other | Admitting: Obstetrics and Gynecology

## 2018-09-10 DIAGNOSIS — D509 Iron deficiency anemia, unspecified: Secondary | ICD-10-CM | POA: Diagnosis not present

## 2018-09-10 DIAGNOSIS — N186 End stage renal disease: Secondary | ICD-10-CM | POA: Diagnosis not present

## 2018-09-10 DIAGNOSIS — Z992 Dependence on renal dialysis: Secondary | ICD-10-CM | POA: Diagnosis not present

## 2018-09-10 DIAGNOSIS — D631 Anemia in chronic kidney disease: Secondary | ICD-10-CM | POA: Diagnosis not present

## 2018-09-11 ENCOUNTER — Ambulatory Visit: Payer: Medicare Other | Admitting: Obstetrics and Gynecology

## 2018-09-11 DIAGNOSIS — Z992 Dependence on renal dialysis: Secondary | ICD-10-CM | POA: Diagnosis not present

## 2018-09-11 DIAGNOSIS — D631 Anemia in chronic kidney disease: Secondary | ICD-10-CM | POA: Diagnosis not present

## 2018-09-11 DIAGNOSIS — N186 End stage renal disease: Secondary | ICD-10-CM | POA: Diagnosis not present

## 2018-09-11 DIAGNOSIS — D509 Iron deficiency anemia, unspecified: Secondary | ICD-10-CM | POA: Diagnosis not present

## 2018-09-12 DIAGNOSIS — D631 Anemia in chronic kidney disease: Secondary | ICD-10-CM | POA: Diagnosis not present

## 2018-09-12 DIAGNOSIS — Z992 Dependence on renal dialysis: Secondary | ICD-10-CM | POA: Diagnosis not present

## 2018-09-12 DIAGNOSIS — N186 End stage renal disease: Secondary | ICD-10-CM | POA: Diagnosis not present

## 2018-09-12 DIAGNOSIS — D509 Iron deficiency anemia, unspecified: Secondary | ICD-10-CM | POA: Diagnosis not present

## 2018-09-13 DIAGNOSIS — D509 Iron deficiency anemia, unspecified: Secondary | ICD-10-CM | POA: Diagnosis not present

## 2018-09-13 DIAGNOSIS — D631 Anemia in chronic kidney disease: Secondary | ICD-10-CM | POA: Diagnosis not present

## 2018-09-13 DIAGNOSIS — N186 End stage renal disease: Secondary | ICD-10-CM | POA: Diagnosis not present

## 2018-09-13 DIAGNOSIS — Z992 Dependence on renal dialysis: Secondary | ICD-10-CM | POA: Diagnosis not present

## 2018-09-14 ENCOUNTER — Telehealth: Payer: Self-pay | Admitting: Obstetrics and Gynecology

## 2018-09-14 DIAGNOSIS — N186 End stage renal disease: Secondary | ICD-10-CM | POA: Diagnosis not present

## 2018-09-14 DIAGNOSIS — D509 Iron deficiency anemia, unspecified: Secondary | ICD-10-CM | POA: Diagnosis not present

## 2018-09-14 DIAGNOSIS — Z992 Dependence on renal dialysis: Secondary | ICD-10-CM | POA: Diagnosis not present

## 2018-09-14 DIAGNOSIS — D631 Anemia in chronic kidney disease: Secondary | ICD-10-CM | POA: Diagnosis not present

## 2018-09-14 NOTE — Telephone Encounter (Signed)
Called patient and left message informing her that we are not allowing any visitors or children to come to visit with her at this time and we are requiring a mask to be worn during the visit. Asked if she has had any exposure to anyone suspected or confirmed of having COVID-19 or if she is experiencing any of the following: fever, cough, sob, muscle pain, severe headache, sore throat, diarrhea, loss of taste or smell or ear, nose or throat discomfort to call and reschedule.  Encouraged to do e-check-in and Hello Patient prior to arrival and to call our office when she is in our office parking lot in order for [place] to complete registration over the phone.

## 2018-09-15 DIAGNOSIS — N186 End stage renal disease: Secondary | ICD-10-CM | POA: Diagnosis not present

## 2018-09-15 DIAGNOSIS — D509 Iron deficiency anemia, unspecified: Secondary | ICD-10-CM | POA: Diagnosis not present

## 2018-09-15 DIAGNOSIS — D631 Anemia in chronic kidney disease: Secondary | ICD-10-CM | POA: Diagnosis not present

## 2018-09-15 DIAGNOSIS — Z992 Dependence on renal dialysis: Secondary | ICD-10-CM | POA: Diagnosis not present

## 2018-09-16 DIAGNOSIS — D631 Anemia in chronic kidney disease: Secondary | ICD-10-CM | POA: Diagnosis not present

## 2018-09-16 DIAGNOSIS — D509 Iron deficiency anemia, unspecified: Secondary | ICD-10-CM | POA: Diagnosis not present

## 2018-09-16 DIAGNOSIS — N186 End stage renal disease: Secondary | ICD-10-CM | POA: Diagnosis not present

## 2018-09-16 DIAGNOSIS — Z992 Dependence on renal dialysis: Secondary | ICD-10-CM | POA: Diagnosis not present

## 2018-09-17 ENCOUNTER — Ambulatory Visit (INDEPENDENT_AMBULATORY_CARE_PROVIDER_SITE_OTHER): Payer: Medicare Other | Admitting: Podiatry

## 2018-09-17 ENCOUNTER — Other Ambulatory Visit: Payer: Self-pay | Admitting: Podiatry

## 2018-09-17 ENCOUNTER — Ambulatory Visit (INDEPENDENT_AMBULATORY_CARE_PROVIDER_SITE_OTHER): Payer: Medicare Other

## 2018-09-17 ENCOUNTER — Other Ambulatory Visit: Payer: Self-pay

## 2018-09-17 ENCOUNTER — Encounter: Payer: Self-pay | Admitting: Podiatry

## 2018-09-17 VITALS — BP 160/83 | HR 84 | Temp 98.1°F

## 2018-09-17 DIAGNOSIS — E1140 Type 2 diabetes mellitus with diabetic neuropathy, unspecified: Secondary | ICD-10-CM | POA: Diagnosis not present

## 2018-09-17 DIAGNOSIS — M79672 Pain in left foot: Secondary | ICD-10-CM

## 2018-09-17 DIAGNOSIS — S98132S Complete traumatic amputation of one left lesser toe, sequela: Secondary | ICD-10-CM

## 2018-09-17 DIAGNOSIS — N186 End stage renal disease: Secondary | ICD-10-CM | POA: Diagnosis not present

## 2018-09-17 DIAGNOSIS — M79676 Pain in unspecified toe(s): Secondary | ICD-10-CM | POA: Diagnosis not present

## 2018-09-17 DIAGNOSIS — B351 Tinea unguium: Secondary | ICD-10-CM | POA: Diagnosis not present

## 2018-09-17 DIAGNOSIS — D509 Iron deficiency anemia, unspecified: Secondary | ICD-10-CM | POA: Diagnosis not present

## 2018-09-17 DIAGNOSIS — Z992 Dependence on renal dialysis: Secondary | ICD-10-CM | POA: Diagnosis not present

## 2018-09-17 DIAGNOSIS — E1149 Type 2 diabetes mellitus with other diabetic neurological complication: Secondary | ICD-10-CM | POA: Diagnosis not present

## 2018-09-17 DIAGNOSIS — D631 Anemia in chronic kidney disease: Secondary | ICD-10-CM | POA: Diagnosis not present

## 2018-09-18 ENCOUNTER — Ambulatory Visit: Payer: Medicare Other | Admitting: Obstetrics and Gynecology

## 2018-09-18 DIAGNOSIS — D509 Iron deficiency anemia, unspecified: Secondary | ICD-10-CM | POA: Diagnosis not present

## 2018-09-18 DIAGNOSIS — D631 Anemia in chronic kidney disease: Secondary | ICD-10-CM | POA: Diagnosis not present

## 2018-09-18 DIAGNOSIS — N186 End stage renal disease: Secondary | ICD-10-CM | POA: Diagnosis not present

## 2018-09-18 DIAGNOSIS — Z992 Dependence on renal dialysis: Secondary | ICD-10-CM | POA: Diagnosis not present

## 2018-09-18 NOTE — Progress Notes (Signed)
Subjective:   Patient ID: Janet Mitchell, female   DOB: 53 y.o.   MRN: [id number]   HPI Patient presents stating that she did have amputation of her left big toe she has long-term diabetes nail disease and had some discoloration of the outside left foot that she is worried can become an ulcer.  Did have previous infection which required amputation.  Patient does not smoke and would like to be active   Review of Systems  All other systems reviewed and are negative.       Objective:  Physical Exam Vitals signs and nursing note reviewed.  Constitutional:      Appearance: She is well-developed.  Pulmonary:     Effort: Pulmonary effort is normal.  Musculoskeletal: Normal range of motion.  Skin:    General: Skin is warm.  Neurological:     Mental Status: She is alert.     Vascular status slightly diminished with diminished sharp dull vibratory bilateral.  Patient is found to have thickened yellow brittle nailbeds 1 through 5 right 2 through 5 left with a loss of the left hallux secondary to previous infection and moderate instability when she walks.  She has some discoloration outside the left foot but there is no odor no drainage or other pathology     Assessment:  At risk diabetic with history of amputation with mycotic nail infection and trauma to the left lateral foot which does not appear to be infected     Plan:  H&P reviewed all conditions and diabetic education rendered.  Today I debrided all nailbeds 1 through 5 both feet with no iatrogenic bleeding and I advised on wider shoes and other treatment options.  Patient is going to have diabetic shoes and is can have a toe filler done of the left big toe to try to stabilize it and this was done by ped orthotist today

## 2018-09-19 DIAGNOSIS — D631 Anemia in chronic kidney disease: Secondary | ICD-10-CM | POA: Diagnosis not present

## 2018-09-19 DIAGNOSIS — D509 Iron deficiency anemia, unspecified: Secondary | ICD-10-CM | POA: Diagnosis not present

## 2018-09-19 DIAGNOSIS — Z992 Dependence on renal dialysis: Secondary | ICD-10-CM | POA: Diagnosis not present

## 2018-09-19 DIAGNOSIS — N186 End stage renal disease: Secondary | ICD-10-CM | POA: Diagnosis not present

## 2018-09-20 DIAGNOSIS — D509 Iron deficiency anemia, unspecified: Secondary | ICD-10-CM | POA: Diagnosis not present

## 2018-09-20 DIAGNOSIS — D631 Anemia in chronic kidney disease: Secondary | ICD-10-CM | POA: Diagnosis not present

## 2018-09-20 DIAGNOSIS — Z992 Dependence on renal dialysis: Secondary | ICD-10-CM | POA: Diagnosis not present

## 2018-09-20 DIAGNOSIS — N186 End stage renal disease: Secondary | ICD-10-CM | POA: Diagnosis not present

## 2018-09-21 DIAGNOSIS — D631 Anemia in chronic kidney disease: Secondary | ICD-10-CM | POA: Diagnosis not present

## 2018-09-21 DIAGNOSIS — D509 Iron deficiency anemia, unspecified: Secondary | ICD-10-CM | POA: Diagnosis not present

## 2018-09-21 DIAGNOSIS — Z992 Dependence on renal dialysis: Secondary | ICD-10-CM | POA: Diagnosis not present

## 2018-09-21 DIAGNOSIS — N186 End stage renal disease: Secondary | ICD-10-CM | POA: Diagnosis not present

## 2018-09-21 IMAGING — US US ABDOMEN LIMITED
1 series · 14 of 25 positions shown · non-contrast
Comparison: None.

CLINICAL DATA: Right upper quadrant pain

EXAM:
ULTRASOUND ABDOMEN LIMITED RIGHT UPPER QUADRANT

[Series 1: us abdomen limited · 0.30mm/px · 14 of 45 slices shown]
[im 1/45]
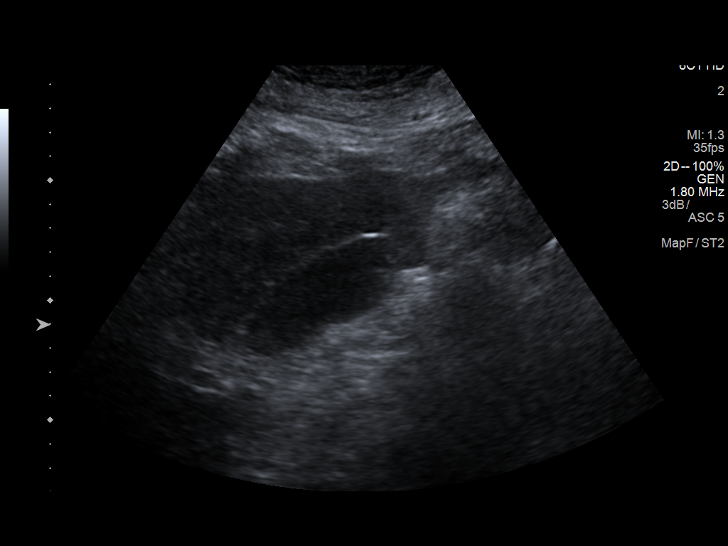
[im 4/45]
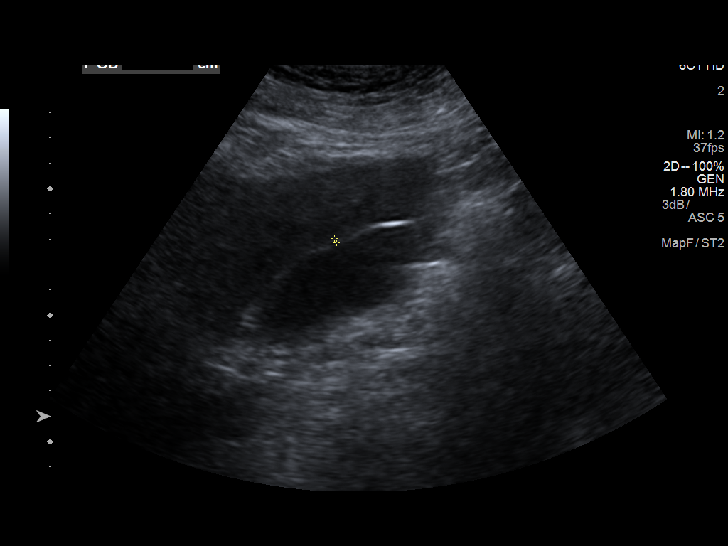
[im 8/45]
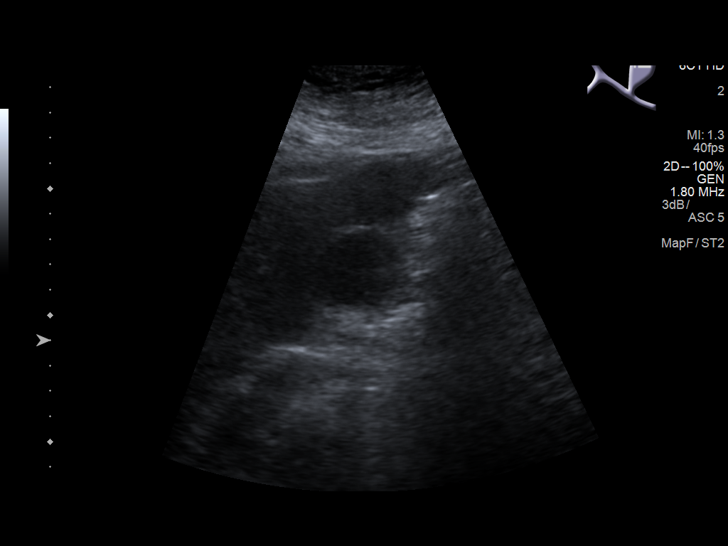
[im 12/45]
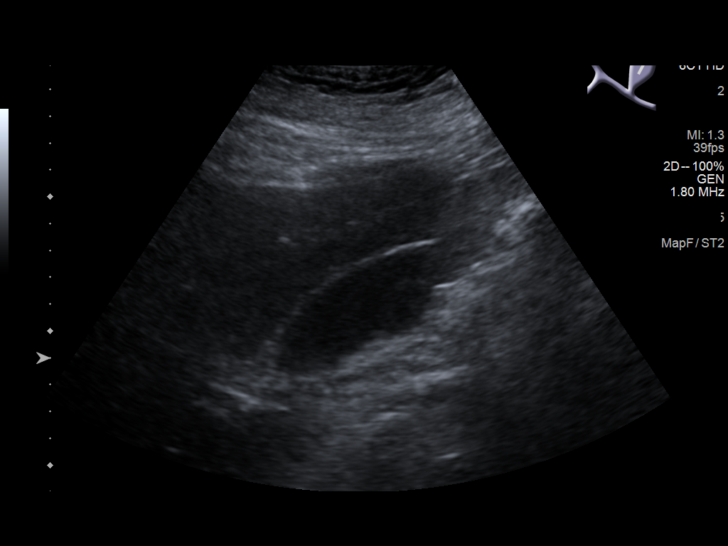
[im 15/45]
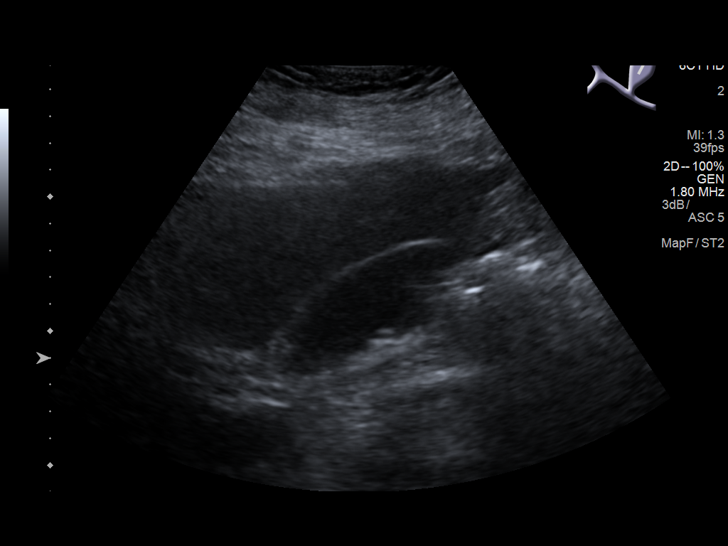
[im 17/45]
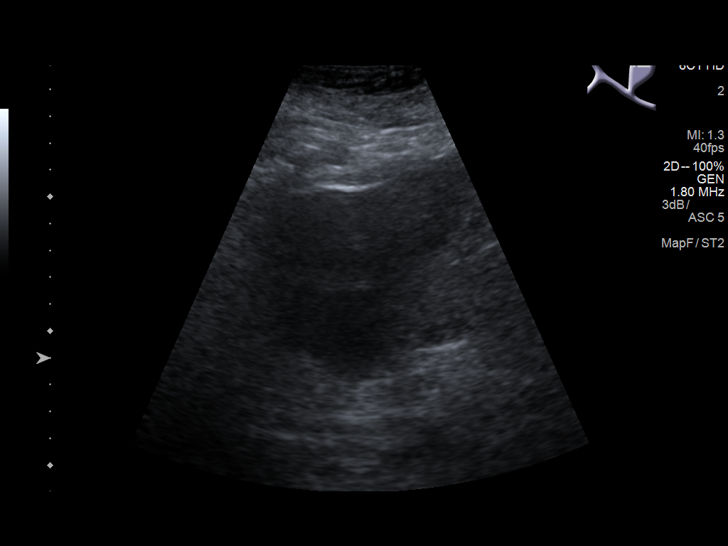
[im 21/45]
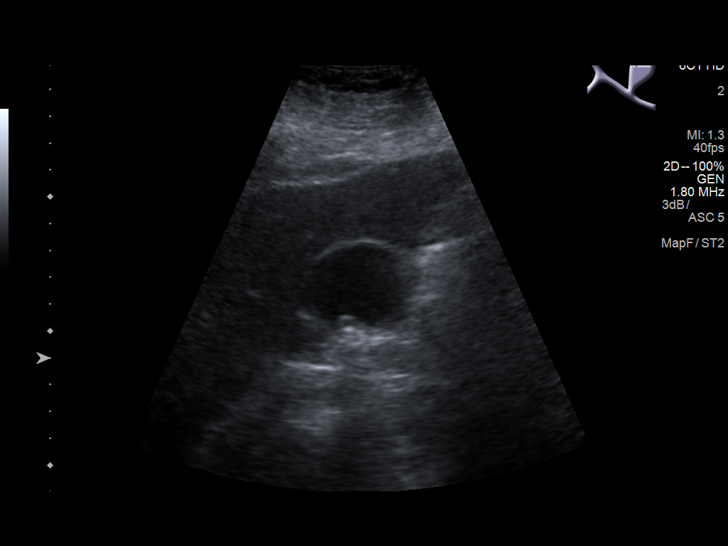
[im 24/45]
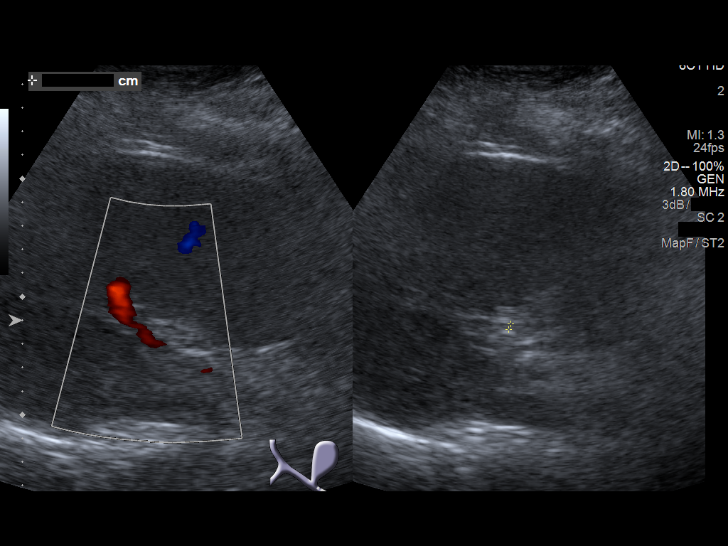
[im 28/45]
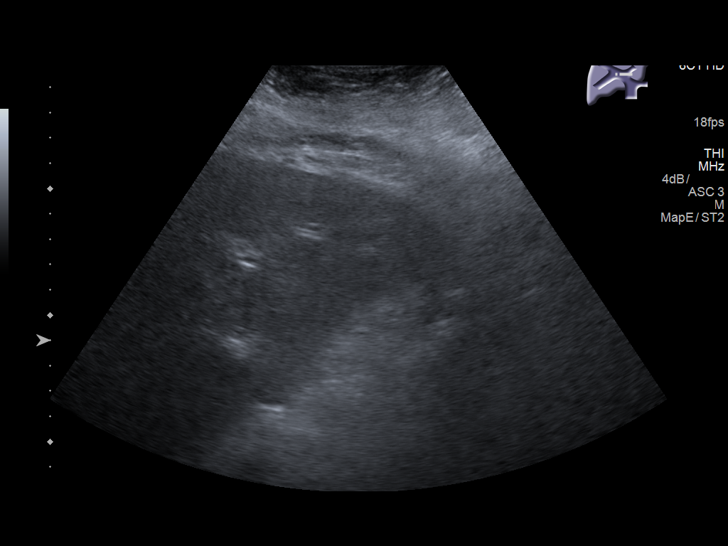
[im 30/45]
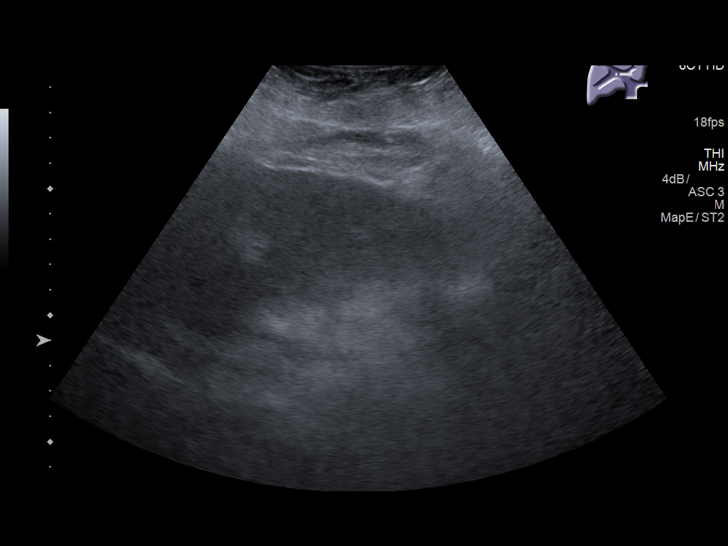
[im 34/45]
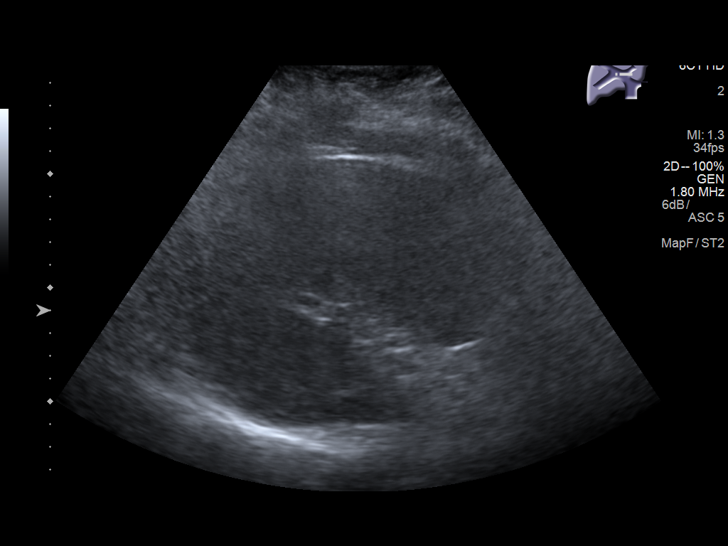
[im 37/45]
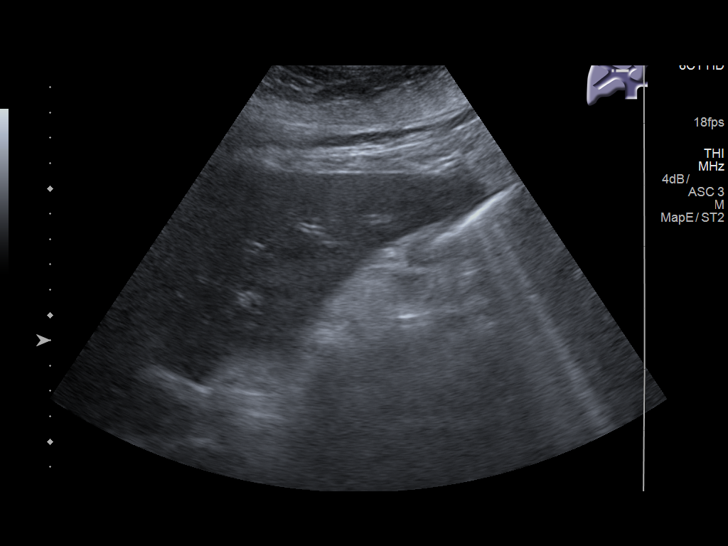
[im 41/45]
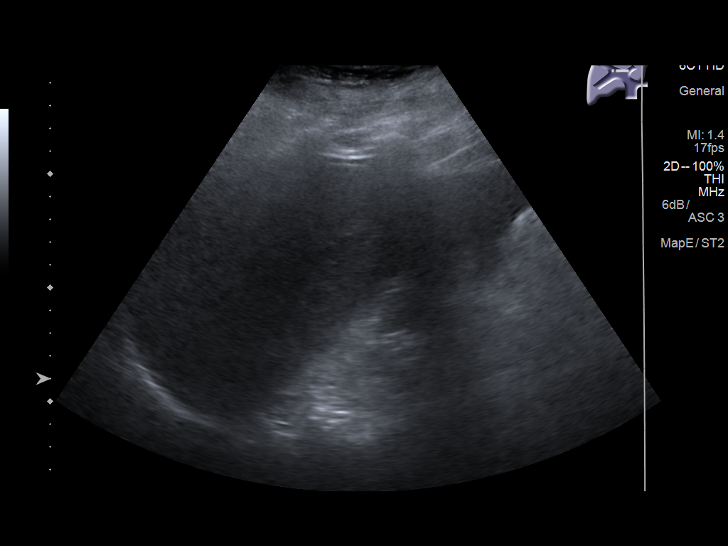
[im 45/45]
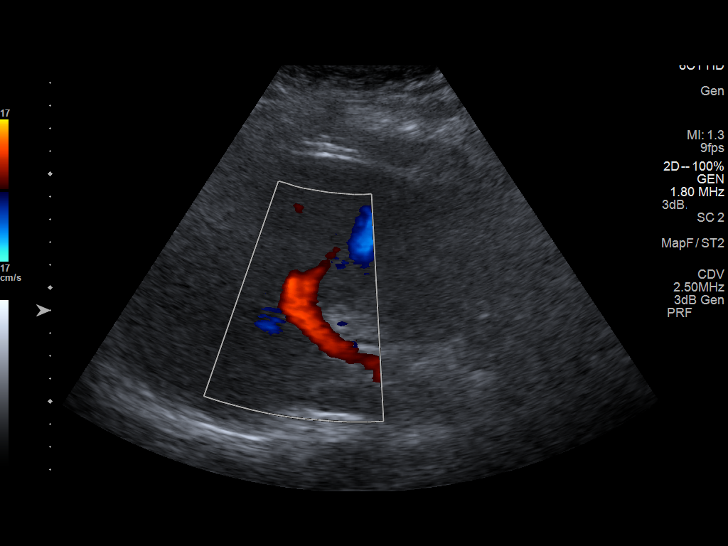

[14 of 25 positions shown; findings below may reference images not displayed]

FINDINGS: Gallbladder:

Small stones measuring up to 6 mm. Normal wall thickness. Negative
sonographic Murphy.

Common bile duct:

Diameter: 2.6 mm

Liver:

Increased hepatic echogenicity without focal abnormality. Portal
vein is patent on color Doppler imaging with normal direction of
blood flow towards the liver.
IMPRESSION: Difficult study secondary to habitus. Cholelithiasis without
sonographic evidence for acute cholecystitis or biliary dilatation.
Increased hepatic echogenicity suggesting steatosis

## 2018-09-22 DIAGNOSIS — Z992 Dependence on renal dialysis: Secondary | ICD-10-CM | POA: Diagnosis not present

## 2018-09-22 DIAGNOSIS — D509 Iron deficiency anemia, unspecified: Secondary | ICD-10-CM | POA: Diagnosis not present

## 2018-09-22 DIAGNOSIS — D631 Anemia in chronic kidney disease: Secondary | ICD-10-CM | POA: Diagnosis not present

## 2018-09-22 DIAGNOSIS — N186 End stage renal disease: Secondary | ICD-10-CM | POA: Diagnosis not present

## 2018-09-23 DIAGNOSIS — N186 End stage renal disease: Secondary | ICD-10-CM | POA: Diagnosis not present

## 2018-09-23 DIAGNOSIS — Z992 Dependence on renal dialysis: Secondary | ICD-10-CM | POA: Diagnosis not present

## 2018-09-23 DIAGNOSIS — D509 Iron deficiency anemia, unspecified: Secondary | ICD-10-CM | POA: Diagnosis not present

## 2018-09-23 DIAGNOSIS — D631 Anemia in chronic kidney disease: Secondary | ICD-10-CM | POA: Diagnosis not present

## 2018-09-24 DIAGNOSIS — D631 Anemia in chronic kidney disease: Secondary | ICD-10-CM | POA: Diagnosis not present

## 2018-09-24 DIAGNOSIS — N186 End stage renal disease: Secondary | ICD-10-CM | POA: Diagnosis not present

## 2018-09-24 DIAGNOSIS — D509 Iron deficiency anemia, unspecified: Secondary | ICD-10-CM | POA: Diagnosis not present

## 2018-09-24 DIAGNOSIS — Z992 Dependence on renal dialysis: Secondary | ICD-10-CM | POA: Diagnosis not present

## 2018-09-25 DIAGNOSIS — D509 Iron deficiency anemia, unspecified: Secondary | ICD-10-CM | POA: Diagnosis not present

## 2018-09-25 DIAGNOSIS — N186 End stage renal disease: Secondary | ICD-10-CM | POA: Diagnosis not present

## 2018-09-25 DIAGNOSIS — D631 Anemia in chronic kidney disease: Secondary | ICD-10-CM | POA: Diagnosis not present

## 2018-09-25 DIAGNOSIS — Z992 Dependence on renal dialysis: Secondary | ICD-10-CM | POA: Diagnosis not present

## 2018-09-26 ENCOUNTER — Telehealth: Payer: Self-pay | Admitting: *Deleted

## 2018-09-26 DIAGNOSIS — D631 Anemia in chronic kidney disease: Secondary | ICD-10-CM | POA: Diagnosis not present

## 2018-09-26 DIAGNOSIS — N2581 Secondary hyperparathyroidism of renal origin: Secondary | ICD-10-CM | POA: Diagnosis not present

## 2018-09-26 DIAGNOSIS — D509 Iron deficiency anemia, unspecified: Secondary | ICD-10-CM | POA: Diagnosis not present

## 2018-09-26 DIAGNOSIS — Z992 Dependence on renal dialysis: Secondary | ICD-10-CM | POA: Diagnosis not present

## 2018-09-26 DIAGNOSIS — N186 End stage renal disease: Secondary | ICD-10-CM | POA: Diagnosis not present

## 2018-09-26 NOTE — Telephone Encounter (Signed)
LMOVM

## 2018-09-26 NOTE — Telephone Encounter (Signed)
Patient called back. She is aware she will need to have COVID-19 testing 3 days prior to procedure. She is scheduled for testing 7/24 at 3pm. Patient aware she will need to remain quarantined at home until procedure is done. She voiced understanding.

## 2018-09-27 DIAGNOSIS — D631 Anemia in chronic kidney disease: Secondary | ICD-10-CM | POA: Diagnosis not present

## 2018-09-27 DIAGNOSIS — N2581 Secondary hyperparathyroidism of renal origin: Secondary | ICD-10-CM | POA: Diagnosis not present

## 2018-09-27 DIAGNOSIS — Z992 Dependence on renal dialysis: Secondary | ICD-10-CM | POA: Diagnosis not present

## 2018-09-27 DIAGNOSIS — D509 Iron deficiency anemia, unspecified: Secondary | ICD-10-CM | POA: Diagnosis not present

## 2018-09-27 DIAGNOSIS — N186 End stage renal disease: Secondary | ICD-10-CM | POA: Diagnosis not present

## 2018-09-28 DIAGNOSIS — D631 Anemia in chronic kidney disease: Secondary | ICD-10-CM | POA: Diagnosis not present

## 2018-09-28 DIAGNOSIS — N186 End stage renal disease: Secondary | ICD-10-CM | POA: Diagnosis not present

## 2018-09-28 DIAGNOSIS — N2581 Secondary hyperparathyroidism of renal origin: Secondary | ICD-10-CM | POA: Diagnosis not present

## 2018-09-28 DIAGNOSIS — D509 Iron deficiency anemia, unspecified: Secondary | ICD-10-CM | POA: Diagnosis not present

## 2018-09-28 DIAGNOSIS — Z992 Dependence on renal dialysis: Secondary | ICD-10-CM | POA: Diagnosis not present

## 2018-09-29 DIAGNOSIS — N2581 Secondary hyperparathyroidism of renal origin: Secondary | ICD-10-CM | POA: Diagnosis not present

## 2018-09-29 DIAGNOSIS — D631 Anemia in chronic kidney disease: Secondary | ICD-10-CM | POA: Diagnosis not present

## 2018-09-29 DIAGNOSIS — Z992 Dependence on renal dialysis: Secondary | ICD-10-CM | POA: Diagnosis not present

## 2018-09-29 DIAGNOSIS — D509 Iron deficiency anemia, unspecified: Secondary | ICD-10-CM | POA: Diagnosis not present

## 2018-09-29 DIAGNOSIS — N186 End stage renal disease: Secondary | ICD-10-CM | POA: Diagnosis not present

## 2018-09-30 DIAGNOSIS — D509 Iron deficiency anemia, unspecified: Secondary | ICD-10-CM | POA: Diagnosis not present

## 2018-09-30 DIAGNOSIS — Z992 Dependence on renal dialysis: Secondary | ICD-10-CM | POA: Diagnosis not present

## 2018-09-30 DIAGNOSIS — N186 End stage renal disease: Secondary | ICD-10-CM | POA: Diagnosis not present

## 2018-09-30 DIAGNOSIS — N2581 Secondary hyperparathyroidism of renal origin: Secondary | ICD-10-CM | POA: Diagnosis not present

## 2018-09-30 DIAGNOSIS — D631 Anemia in chronic kidney disease: Secondary | ICD-10-CM | POA: Diagnosis not present

## 2018-10-01 DIAGNOSIS — N186 End stage renal disease: Secondary | ICD-10-CM | POA: Diagnosis not present

## 2018-10-01 DIAGNOSIS — D509 Iron deficiency anemia, unspecified: Secondary | ICD-10-CM | POA: Diagnosis not present

## 2018-10-01 DIAGNOSIS — D631 Anemia in chronic kidney disease: Secondary | ICD-10-CM | POA: Diagnosis not present

## 2018-10-01 DIAGNOSIS — Z992 Dependence on renal dialysis: Secondary | ICD-10-CM | POA: Diagnosis not present

## 2018-10-01 DIAGNOSIS — N2581 Secondary hyperparathyroidism of renal origin: Secondary | ICD-10-CM | POA: Diagnosis not present

## 2018-10-02 ENCOUNTER — Telehealth: Payer: Self-pay | Admitting: Obstetrics and Gynecology

## 2018-10-02 DIAGNOSIS — E119 Type 2 diabetes mellitus without complications: Secondary | ICD-10-CM | POA: Diagnosis not present

## 2018-10-02 DIAGNOSIS — Z992 Dependence on renal dialysis: Secondary | ICD-10-CM | POA: Diagnosis not present

## 2018-10-02 DIAGNOSIS — Z794 Long term (current) use of insulin: Secondary | ICD-10-CM | POA: Diagnosis not present

## 2018-10-02 DIAGNOSIS — D631 Anemia in chronic kidney disease: Secondary | ICD-10-CM | POA: Diagnosis not present

## 2018-10-02 DIAGNOSIS — N2581 Secondary hyperparathyroidism of renal origin: Secondary | ICD-10-CM | POA: Diagnosis not present

## 2018-10-02 DIAGNOSIS — D509 Iron deficiency anemia, unspecified: Secondary | ICD-10-CM | POA: Diagnosis not present

## 2018-10-02 DIAGNOSIS — N186 End stage renal disease: Secondary | ICD-10-CM | POA: Diagnosis not present

## 2018-10-02 NOTE — Telephone Encounter (Signed)

## 2018-10-03 ENCOUNTER — Encounter: Payer: Self-pay | Admitting: Obstetrics and Gynecology

## 2018-10-03 ENCOUNTER — Ambulatory Visit (INDEPENDENT_AMBULATORY_CARE_PROVIDER_SITE_OTHER): Payer: Medicare Other | Admitting: Obstetrics and Gynecology

## 2018-10-03 ENCOUNTER — Other Ambulatory Visit: Payer: Self-pay

## 2018-10-03 VITALS — BP 114/69 | HR 72 | Ht 68.0 in | Wt 246.4 lb

## 2018-10-03 DIAGNOSIS — Z30431 Encounter for routine checking of intrauterine contraceptive device: Secondary | ICD-10-CM | POA: Diagnosis not present

## 2018-10-03 DIAGNOSIS — D631 Anemia in chronic kidney disease: Secondary | ICD-10-CM | POA: Diagnosis not present

## 2018-10-03 DIAGNOSIS — N186 End stage renal disease: Secondary | ICD-10-CM | POA: Diagnosis not present

## 2018-10-03 DIAGNOSIS — Z992 Dependence on renal dialysis: Secondary | ICD-10-CM | POA: Diagnosis not present

## 2018-10-03 DIAGNOSIS — D509 Iron deficiency anemia, unspecified: Secondary | ICD-10-CM | POA: Diagnosis not present

## 2018-10-03 DIAGNOSIS — N2581 Secondary hyperparathyroidism of renal origin: Secondary | ICD-10-CM | POA: Diagnosis not present

## 2018-10-03 NOTE — Progress Notes (Signed)
Patient ID: Janet Mitchell, female   DOB: Aug 06, 1965, 53 y.o.   MRN: [id number]  GYNECOLOGY OFFICE PROGRESS NOTE  History:  53 y.o. G2P2 here today for today for IUD string check; Liletta type of IUD was placed on  07/11/2018. No complaints about the IUD, no concerning side effects.  The following portions of the patient's history were reviewed and updated as appropriate: allergies, current medications, past family history, past medical history, past social history, past surgical history and problem list.  Review of Systems:  Pertinent items are noted in HPI.   Objective:  Physical Exam There were no vitals taken for this visit. CONSTITUTIONAL: Well-developed, well-nourished female in no acute distress.  HENT:  Normocephalic, atraumatic. External right and left ear normal. Oropharynx is clear and moist EYES: Conjunctivae and EOM are normal. Pupils are equal, round, and reactive to light. No scleral icterus.  NECK: Normal range of motion, supple, no masses CARDIOVASCULAR: Normal heart rate noted RESPIRATORY: Effort and breath sounds normal, no problems with respiration noted ABDOMEN: Soft, no distention noted.   PELVIC: Normal appearing external genitalia; normal appearing vaginal mucosa and cervix.  IUD strings visualized, about 2.5 cm in length outside cervix.  Ultrasound was not  Used to reassess iud position Assessment & Plan:  Normal IUD check. Patient to keep IUD in place for five years; can come in for removal if she desires pregnancy within the next five years. Routine preventative health maintenance measures emphasized.  By signing my name below, I, Samul Dada, attest that this documentation has been prepared under the direction and in the presence of Jonnie Kind, MD. Electronically Signed: Leon. 10/03/18. 2:58 PM.  I personally performed the services described in this documentation, which was SCRIBED in my presence. The recorded information has  been reviewed and considered accurate. It has been edited as necessary during review. Jonnie Kind, MD

## 2018-10-04 DIAGNOSIS — N186 End stage renal disease: Secondary | ICD-10-CM | POA: Diagnosis not present

## 2018-10-04 DIAGNOSIS — D509 Iron deficiency anemia, unspecified: Secondary | ICD-10-CM | POA: Diagnosis not present

## 2018-10-04 DIAGNOSIS — D631 Anemia in chronic kidney disease: Secondary | ICD-10-CM | POA: Diagnosis not present

## 2018-10-04 DIAGNOSIS — Z992 Dependence on renal dialysis: Secondary | ICD-10-CM | POA: Diagnosis not present

## 2018-10-04 DIAGNOSIS — N2581 Secondary hyperparathyroidism of renal origin: Secondary | ICD-10-CM | POA: Diagnosis not present

## 2018-10-05 DIAGNOSIS — N186 End stage renal disease: Secondary | ICD-10-CM | POA: Diagnosis not present

## 2018-10-05 DIAGNOSIS — D509 Iron deficiency anemia, unspecified: Secondary | ICD-10-CM | POA: Diagnosis not present

## 2018-10-05 DIAGNOSIS — D631 Anemia in chronic kidney disease: Secondary | ICD-10-CM | POA: Diagnosis not present

## 2018-10-05 DIAGNOSIS — Z992 Dependence on renal dialysis: Secondary | ICD-10-CM | POA: Diagnosis not present

## 2018-10-05 DIAGNOSIS — N2581 Secondary hyperparathyroidism of renal origin: Secondary | ICD-10-CM | POA: Diagnosis not present

## 2018-10-06 DIAGNOSIS — Z992 Dependence on renal dialysis: Secondary | ICD-10-CM | POA: Diagnosis not present

## 2018-10-06 DIAGNOSIS — N186 End stage renal disease: Secondary | ICD-10-CM | POA: Diagnosis not present

## 2018-10-06 DIAGNOSIS — D509 Iron deficiency anemia, unspecified: Secondary | ICD-10-CM | POA: Diagnosis not present

## 2018-10-06 DIAGNOSIS — D631 Anemia in chronic kidney disease: Secondary | ICD-10-CM | POA: Diagnosis not present

## 2018-10-06 DIAGNOSIS — N2581 Secondary hyperparathyroidism of renal origin: Secondary | ICD-10-CM | POA: Diagnosis not present

## 2018-10-07 DIAGNOSIS — N186 End stage renal disease: Secondary | ICD-10-CM | POA: Diagnosis not present

## 2018-10-07 DIAGNOSIS — D509 Iron deficiency anemia, unspecified: Secondary | ICD-10-CM | POA: Diagnosis not present

## 2018-10-07 DIAGNOSIS — Z992 Dependence on renal dialysis: Secondary | ICD-10-CM | POA: Diagnosis not present

## 2018-10-07 DIAGNOSIS — N2581 Secondary hyperparathyroidism of renal origin: Secondary | ICD-10-CM | POA: Diagnosis not present

## 2018-10-07 DIAGNOSIS — D631 Anemia in chronic kidney disease: Secondary | ICD-10-CM | POA: Diagnosis not present

## 2018-10-08 DIAGNOSIS — N2581 Secondary hyperparathyroidism of renal origin: Secondary | ICD-10-CM | POA: Diagnosis not present

## 2018-10-08 DIAGNOSIS — D631 Anemia in chronic kidney disease: Secondary | ICD-10-CM | POA: Diagnosis not present

## 2018-10-08 DIAGNOSIS — D509 Iron deficiency anemia, unspecified: Secondary | ICD-10-CM | POA: Diagnosis not present

## 2018-10-08 DIAGNOSIS — N186 End stage renal disease: Secondary | ICD-10-CM | POA: Diagnosis not present

## 2018-10-08 DIAGNOSIS — Z992 Dependence on renal dialysis: Secondary | ICD-10-CM | POA: Diagnosis not present

## 2018-10-09 DIAGNOSIS — D509 Iron deficiency anemia, unspecified: Secondary | ICD-10-CM | POA: Diagnosis not present

## 2018-10-09 DIAGNOSIS — N2581 Secondary hyperparathyroidism of renal origin: Secondary | ICD-10-CM | POA: Diagnosis not present

## 2018-10-09 DIAGNOSIS — N186 End stage renal disease: Secondary | ICD-10-CM | POA: Diagnosis not present

## 2018-10-09 DIAGNOSIS — Z992 Dependence on renal dialysis: Secondary | ICD-10-CM | POA: Diagnosis not present

## 2018-10-09 DIAGNOSIS — D631 Anemia in chronic kidney disease: Secondary | ICD-10-CM | POA: Diagnosis not present

## 2018-10-10 DIAGNOSIS — D509 Iron deficiency anemia, unspecified: Secondary | ICD-10-CM | POA: Diagnosis not present

## 2018-10-10 DIAGNOSIS — D631 Anemia in chronic kidney disease: Secondary | ICD-10-CM | POA: Diagnosis not present

## 2018-10-10 DIAGNOSIS — Z992 Dependence on renal dialysis: Secondary | ICD-10-CM | POA: Diagnosis not present

## 2018-10-10 DIAGNOSIS — N186 End stage renal disease: Secondary | ICD-10-CM | POA: Diagnosis not present

## 2018-10-10 DIAGNOSIS — N2581 Secondary hyperparathyroidism of renal origin: Secondary | ICD-10-CM | POA: Diagnosis not present

## 2018-10-11 DIAGNOSIS — N186 End stage renal disease: Secondary | ICD-10-CM | POA: Diagnosis not present

## 2018-10-11 DIAGNOSIS — Z992 Dependence on renal dialysis: Secondary | ICD-10-CM | POA: Diagnosis not present

## 2018-10-11 DIAGNOSIS — D509 Iron deficiency anemia, unspecified: Secondary | ICD-10-CM | POA: Diagnosis not present

## 2018-10-11 DIAGNOSIS — N2581 Secondary hyperparathyroidism of renal origin: Secondary | ICD-10-CM | POA: Diagnosis not present

## 2018-10-11 DIAGNOSIS — D631 Anemia in chronic kidney disease: Secondary | ICD-10-CM | POA: Diagnosis not present

## 2018-10-12 DIAGNOSIS — N2581 Secondary hyperparathyroidism of renal origin: Secondary | ICD-10-CM | POA: Diagnosis not present

## 2018-10-12 DIAGNOSIS — D631 Anemia in chronic kidney disease: Secondary | ICD-10-CM | POA: Diagnosis not present

## 2018-10-12 DIAGNOSIS — D509 Iron deficiency anemia, unspecified: Secondary | ICD-10-CM | POA: Diagnosis not present

## 2018-10-12 DIAGNOSIS — N186 End stage renal disease: Secondary | ICD-10-CM | POA: Diagnosis not present

## 2018-10-12 DIAGNOSIS — Z992 Dependence on renal dialysis: Secondary | ICD-10-CM | POA: Diagnosis not present

## 2018-10-13 DIAGNOSIS — N2581 Secondary hyperparathyroidism of renal origin: Secondary | ICD-10-CM | POA: Diagnosis not present

## 2018-10-13 DIAGNOSIS — D509 Iron deficiency anemia, unspecified: Secondary | ICD-10-CM | POA: Diagnosis not present

## 2018-10-13 DIAGNOSIS — Z992 Dependence on renal dialysis: Secondary | ICD-10-CM | POA: Diagnosis not present

## 2018-10-13 DIAGNOSIS — D631 Anemia in chronic kidney disease: Secondary | ICD-10-CM | POA: Diagnosis not present

## 2018-10-13 DIAGNOSIS — N186 End stage renal disease: Secondary | ICD-10-CM | POA: Diagnosis not present

## 2018-10-14 DIAGNOSIS — D631 Anemia in chronic kidney disease: Secondary | ICD-10-CM | POA: Diagnosis not present

## 2018-10-14 DIAGNOSIS — D509 Iron deficiency anemia, unspecified: Secondary | ICD-10-CM | POA: Diagnosis not present

## 2018-10-14 DIAGNOSIS — Z992 Dependence on renal dialysis: Secondary | ICD-10-CM | POA: Diagnosis not present

## 2018-10-14 DIAGNOSIS — N2581 Secondary hyperparathyroidism of renal origin: Secondary | ICD-10-CM | POA: Diagnosis not present

## 2018-10-14 DIAGNOSIS — N186 End stage renal disease: Secondary | ICD-10-CM | POA: Diagnosis not present

## 2018-10-15 ENCOUNTER — Ambulatory Visit: Payer: Medicare Other | Admitting: Orthotics

## 2018-10-15 ENCOUNTER — Other Ambulatory Visit: Payer: Self-pay

## 2018-10-15 DIAGNOSIS — E1140 Type 2 diabetes mellitus with diabetic neuropathy, unspecified: Secondary | ICD-10-CM

## 2018-10-15 DIAGNOSIS — S98132S Complete traumatic amputation of one left lesser toe, sequela: Secondary | ICD-10-CM

## 2018-10-15 DIAGNOSIS — D631 Anemia in chronic kidney disease: Secondary | ICD-10-CM | POA: Diagnosis not present

## 2018-10-15 DIAGNOSIS — N2581 Secondary hyperparathyroidism of renal origin: Secondary | ICD-10-CM | POA: Diagnosis not present

## 2018-10-15 DIAGNOSIS — Z89412 Acquired absence of left great toe: Secondary | ICD-10-CM

## 2018-10-15 DIAGNOSIS — D509 Iron deficiency anemia, unspecified: Secondary | ICD-10-CM | POA: Diagnosis not present

## 2018-10-15 DIAGNOSIS — N186 End stage renal disease: Secondary | ICD-10-CM | POA: Diagnosis not present

## 2018-10-15 DIAGNOSIS — Z992 Dependence on renal dialysis: Secondary | ICD-10-CM | POA: Diagnosis not present

## 2018-10-16 DIAGNOSIS — D509 Iron deficiency anemia, unspecified: Secondary | ICD-10-CM | POA: Diagnosis not present

## 2018-10-16 DIAGNOSIS — Z992 Dependence on renal dialysis: Secondary | ICD-10-CM | POA: Diagnosis not present

## 2018-10-16 DIAGNOSIS — N2581 Secondary hyperparathyroidism of renal origin: Secondary | ICD-10-CM | POA: Diagnosis not present

## 2018-10-16 DIAGNOSIS — D631 Anemia in chronic kidney disease: Secondary | ICD-10-CM | POA: Diagnosis not present

## 2018-10-16 DIAGNOSIS — N186 End stage renal disease: Secondary | ICD-10-CM | POA: Diagnosis not present

## 2018-10-17 DIAGNOSIS — N2581 Secondary hyperparathyroidism of renal origin: Secondary | ICD-10-CM | POA: Diagnosis not present

## 2018-10-17 DIAGNOSIS — D509 Iron deficiency anemia, unspecified: Secondary | ICD-10-CM | POA: Diagnosis not present

## 2018-10-17 DIAGNOSIS — D631 Anemia in chronic kidney disease: Secondary | ICD-10-CM | POA: Diagnosis not present

## 2018-10-17 DIAGNOSIS — Z992 Dependence on renal dialysis: Secondary | ICD-10-CM | POA: Diagnosis not present

## 2018-10-17 DIAGNOSIS — N186 End stage renal disease: Secondary | ICD-10-CM | POA: Diagnosis not present

## 2018-10-18 DIAGNOSIS — N2581 Secondary hyperparathyroidism of renal origin: Secondary | ICD-10-CM | POA: Diagnosis not present

## 2018-10-18 DIAGNOSIS — Z992 Dependence on renal dialysis: Secondary | ICD-10-CM | POA: Diagnosis not present

## 2018-10-18 DIAGNOSIS — D509 Iron deficiency anemia, unspecified: Secondary | ICD-10-CM | POA: Diagnosis not present

## 2018-10-18 DIAGNOSIS — D631 Anemia in chronic kidney disease: Secondary | ICD-10-CM | POA: Diagnosis not present

## 2018-10-18 DIAGNOSIS — N186 End stage renal disease: Secondary | ICD-10-CM | POA: Diagnosis not present

## 2018-10-19 ENCOUNTER — Other Ambulatory Visit: Payer: Self-pay

## 2018-10-19 ENCOUNTER — Other Ambulatory Visit (HOSPITAL_COMMUNITY)
Admission: RE | Admit: 2018-10-19 | Discharge: 2018-10-19 | Disposition: A | Discharge status: Home / Self Care | Payer: Medicare Other | Source: Ambulatory Visit | Attending: Internal Medicine | Admitting: Internal Medicine

## 2018-10-19 DIAGNOSIS — N186 End stage renal disease: Secondary | ICD-10-CM | POA: Diagnosis not present

## 2018-10-19 DIAGNOSIS — D509 Iron deficiency anemia, unspecified: Secondary | ICD-10-CM | POA: Diagnosis not present

## 2018-10-19 DIAGNOSIS — D631 Anemia in chronic kidney disease: Secondary | ICD-10-CM | POA: Diagnosis not present

## 2018-10-19 DIAGNOSIS — Z1159 Encounter for screening for other viral diseases: Secondary | ICD-10-CM | POA: Insufficient documentation

## 2018-10-19 DIAGNOSIS — N2581 Secondary hyperparathyroidism of renal origin: Secondary | ICD-10-CM | POA: Diagnosis not present

## 2018-10-19 DIAGNOSIS — Z992 Dependence on renal dialysis: Secondary | ICD-10-CM | POA: Diagnosis not present

## 2018-10-20 DIAGNOSIS — N186 End stage renal disease: Secondary | ICD-10-CM | POA: Diagnosis not present

## 2018-10-20 DIAGNOSIS — N2581 Secondary hyperparathyroidism of renal origin: Secondary | ICD-10-CM | POA: Diagnosis not present

## 2018-10-20 DIAGNOSIS — D509 Iron deficiency anemia, unspecified: Secondary | ICD-10-CM | POA: Diagnosis not present

## 2018-10-20 DIAGNOSIS — D631 Anemia in chronic kidney disease: Secondary | ICD-10-CM | POA: Diagnosis not present

## 2018-10-20 DIAGNOSIS — Z992 Dependence on renal dialysis: Secondary | ICD-10-CM | POA: Diagnosis not present

## 2018-10-20 LAB — SARS CORONAVIRUS 2 (TAT 6-24 HRS): SARS Coronavirus 2: NEGATIVE

## 2018-10-21 DIAGNOSIS — Z992 Dependence on renal dialysis: Secondary | ICD-10-CM | POA: Diagnosis not present

## 2018-10-21 DIAGNOSIS — D509 Iron deficiency anemia, unspecified: Secondary | ICD-10-CM | POA: Diagnosis not present

## 2018-10-21 DIAGNOSIS — D631 Anemia in chronic kidney disease: Secondary | ICD-10-CM | POA: Diagnosis not present

## 2018-10-21 DIAGNOSIS — N186 End stage renal disease: Secondary | ICD-10-CM | POA: Diagnosis not present

## 2018-10-21 DIAGNOSIS — N2581 Secondary hyperparathyroidism of renal origin: Secondary | ICD-10-CM | POA: Diagnosis not present

## 2018-10-22 DIAGNOSIS — D509 Iron deficiency anemia, unspecified: Secondary | ICD-10-CM | POA: Diagnosis not present

## 2018-10-22 DIAGNOSIS — Z992 Dependence on renal dialysis: Secondary | ICD-10-CM | POA: Diagnosis not present

## 2018-10-22 DIAGNOSIS — D631 Anemia in chronic kidney disease: Secondary | ICD-10-CM | POA: Diagnosis not present

## 2018-10-22 DIAGNOSIS — N186 End stage renal disease: Secondary | ICD-10-CM | POA: Diagnosis not present

## 2018-10-22 DIAGNOSIS — N2581 Secondary hyperparathyroidism of renal origin: Secondary | ICD-10-CM | POA: Diagnosis not present

## 2018-10-23 DIAGNOSIS — Z992 Dependence on renal dialysis: Secondary | ICD-10-CM | POA: Diagnosis not present

## 2018-10-23 DIAGNOSIS — N2581 Secondary hyperparathyroidism of renal origin: Secondary | ICD-10-CM | POA: Diagnosis not present

## 2018-10-23 DIAGNOSIS — D509 Iron deficiency anemia, unspecified: Secondary | ICD-10-CM | POA: Diagnosis not present

## 2018-10-23 DIAGNOSIS — D631 Anemia in chronic kidney disease: Secondary | ICD-10-CM | POA: Diagnosis not present

## 2018-10-23 DIAGNOSIS — N186 End stage renal disease: Secondary | ICD-10-CM | POA: Diagnosis not present

## 2018-10-24 ENCOUNTER — Encounter (HOSPITAL_COMMUNITY)
Admission: RE | Disposition: A | Discharge status: Home / Self Care | Payer: Self-pay | Source: Home / Self Care | Attending: Internal Medicine

## 2018-10-24 ENCOUNTER — Ambulatory Visit (HOSPITAL_COMMUNITY)
Admission: RE | Admit: 2018-10-24 | Discharge: 2018-10-24 | Disposition: A | Discharge status: Home / Self Care | Payer: Medicare Other | Attending: Internal Medicine | Admitting: Internal Medicine

## 2018-10-24 ENCOUNTER — Encounter (HOSPITAL_COMMUNITY): Payer: Self-pay | Admitting: *Deleted

## 2018-10-24 ENCOUNTER — Other Ambulatory Visit: Payer: Self-pay

## 2018-10-24 DIAGNOSIS — K635 Polyp of colon: Secondary | ICD-10-CM | POA: Diagnosis not present

## 2018-10-24 DIAGNOSIS — N2581 Secondary hyperparathyroidism of renal origin: Secondary | ICD-10-CM | POA: Diagnosis not present

## 2018-10-24 DIAGNOSIS — E1122 Type 2 diabetes mellitus with diabetic chronic kidney disease: Secondary | ICD-10-CM | POA: Diagnosis not present

## 2018-10-24 DIAGNOSIS — N186 End stage renal disease: Secondary | ICD-10-CM | POA: Diagnosis not present

## 2018-10-24 DIAGNOSIS — D123 Benign neoplasm of transverse colon: Secondary | ICD-10-CM | POA: Insufficient documentation

## 2018-10-24 DIAGNOSIS — Z7982 Long term (current) use of aspirin: Secondary | ICD-10-CM | POA: Diagnosis not present

## 2018-10-24 DIAGNOSIS — D631 Anemia in chronic kidney disease: Secondary | ICD-10-CM | POA: Diagnosis not present

## 2018-10-24 DIAGNOSIS — Z992 Dependence on renal dialysis: Secondary | ICD-10-CM | POA: Insufficient documentation

## 2018-10-24 DIAGNOSIS — Z79899 Other long term (current) drug therapy: Secondary | ICD-10-CM | POA: Insufficient documentation

## 2018-10-24 DIAGNOSIS — R197 Diarrhea, unspecified: Secondary | ICD-10-CM

## 2018-10-24 DIAGNOSIS — Z1211 Encounter for screening for malignant neoplasm of colon: Secondary | ICD-10-CM

## 2018-10-24 DIAGNOSIS — I132 Hypertensive heart and chronic kidney disease with heart failure and with stage 5 chronic kidney disease, or end stage renal disease: Secondary | ICD-10-CM | POA: Diagnosis not present

## 2018-10-24 DIAGNOSIS — Z841 Family history of disorders of kidney and ureter: Secondary | ICD-10-CM | POA: Diagnosis not present

## 2018-10-24 DIAGNOSIS — Z794 Long term (current) use of insulin: Secondary | ICD-10-CM | POA: Diagnosis not present

## 2018-10-24 DIAGNOSIS — Z793 Long term (current) use of hormonal contraceptives: Secondary | ICD-10-CM | POA: Insufficient documentation

## 2018-10-24 DIAGNOSIS — I509 Heart failure, unspecified: Secondary | ICD-10-CM | POA: Diagnosis not present

## 2018-10-24 DIAGNOSIS — Z89412 Acquired absence of left great toe: Secondary | ICD-10-CM | POA: Diagnosis not present

## 2018-10-24 DIAGNOSIS — D649 Anemia, unspecified: Secondary | ICD-10-CM | POA: Diagnosis not present

## 2018-10-24 DIAGNOSIS — K529 Noninfective gastroenteritis and colitis, unspecified: Secondary | ICD-10-CM | POA: Insufficient documentation

## 2018-10-24 DIAGNOSIS — Z832 Family history of diseases of the blood and blood-forming organs and certain disorders involving the immune mechanism: Secondary | ICD-10-CM | POA: Diagnosis not present

## 2018-10-24 DIAGNOSIS — R51 Headache: Secondary | ICD-10-CM | POA: Diagnosis not present

## 2018-10-24 DIAGNOSIS — Z8249 Family history of ischemic heart disease and other diseases of the circulatory system: Secondary | ICD-10-CM | POA: Insufficient documentation

## 2018-10-24 DIAGNOSIS — Z9049 Acquired absence of other specified parts of digestive tract: Secondary | ICD-10-CM | POA: Insufficient documentation

## 2018-10-24 DIAGNOSIS — D509 Iron deficiency anemia, unspecified: Secondary | ICD-10-CM | POA: Diagnosis not present

## 2018-10-24 HISTORY — PX: POLYPECTOMY: SHX5525

## 2018-10-24 HISTORY — PX: COLONOSCOPY: SHX5424

## 2018-10-24 HISTORY — PX: BIOPSY: SHX5522

## 2018-10-24 LAB — GLUCOSE, CAPILLARY: Glucose-Capillary: 91 mg/dL (ref 70–99)

## 2018-10-24 SURGERY — COLONOSCOPY
Anesthesia: Moderate Sedation

## 2018-10-24 MED ORDER — MEPERIDINE HCL 100 MG/ML IJ SOLN
INTRAMUSCULAR | Status: DC | PRN
  Administered 2018-10-24: 15 mg via INTRAVENOUS

## 2018-10-24 MED ORDER — SODIUM CHLORIDE 0.9 % IV SOLN
INTRAVENOUS | Status: DC
  Administered 2018-10-24: 08:00:00 via INTRAVENOUS

## 2018-10-24 MED ORDER — MEPERIDINE HCL 50 MG/ML IJ SOLN
INTRAMUSCULAR | Status: AC
  Filled 2018-10-24: qty 1

## 2018-10-24 MED ORDER — MIDAZOLAM HCL 5 MG/5ML IJ SOLN
INTRAMUSCULAR | Status: DC | PRN
  Administered 2018-10-24 (×2): 1 mg via INTRAVENOUS
  Administered 2018-10-24 (×2): 2 mg via INTRAVENOUS
  Administered 2018-10-24: 1 mg via INTRAVENOUS

## 2018-10-24 MED ORDER — ONDANSETRON HCL 4 MG/2ML IJ SOLN
INTRAMUSCULAR | Status: DC | PRN
  Administered 2018-10-24: 4 mg via INTRAVENOUS

## 2018-10-24 MED ORDER — ONDANSETRON HCL 4 MG/2ML IJ SOLN
INTRAMUSCULAR | Status: AC
  Filled 2018-10-24: qty 2

## 2018-10-24 MED ORDER — MIDAZOLAM HCL 5 MG/5ML IJ SOLN
INTRAMUSCULAR | Status: AC
  Filled 2018-10-24: qty 10

## 2018-10-24 NOTE — Discharge Instructions (Signed)
Colonoscopy Discharge Instructions  Read the instructions outlined below and refer to this sheet in the next few weeks. These discharge instructions provide you with general information on caring for yourself after you leave the hospital. Your doctor may also give you specific instructions. While your treatment has been planned according to the most current medical practices available, unavoidable complications occasionally occur. If you have any problems or questions after discharge, call Dr. Gala Romney at [phone]. ACTIVITY  You may resume your regular activity, but move at a slower pace for the next 24 hours.   Take frequent rest periods for the next 24 hours.   Walking will help get rid of the air and reduce the bloated feeling in your belly (abdomen).   No driving for 24 hours (because of the medicine (anesthesia) used during the test).    Do not sign any important legal documents or operate any machinery for 24 hours (because of the anesthesia used during the test).  NUTRITION  Drink plenty of fluids.   You may resume your normal diet as instructed by your doctor.   Begin with a light meal and progress to your normal diet. Heavy or fried foods are harder to digest and may make you feel sick to your stomach (nauseated).   Avoid alcoholic beverages for 24 hours or as instructed.  MEDICATIONS  You may resume your normal medications unless your doctor tells you otherwise.  WHAT YOU CAN EXPECT TODAY  Some feelings of bloating in the abdomen.   Passage of more gas than usual.   Spotting of blood in your stool or on the toilet paper.  IF YOU HAD POLYPS REMOVED DURING THE COLONOSCOPY:  No aspirin products for 7 days or as instructed.   No alcohol for 7 days or as instructed.   Eat a soft diet for the next 24 hours.  FINDING OUT THE RESULTS OF YOUR TEST Not all test results are available during your visit. If your test results are not back during the visit, make an appointment  with your caregiver to find out the results. Do not assume everything is normal if you have not heard from your caregiver or the medical facility. It is important for you to follow up on all of your test results.  SEEK IMMEDIATE MEDICAL ATTENTION IF:  You have more than a spotting of blood in your stool.   Your belly is swollen (abdominal distention).   You are nauseated or vomiting.   You have a temperature over 101.   You have abdominal pain or discomfort that is severe or gets worse throughout the day.    Colon polyp information provided  Further recommendations to follow pending review of pathology report  Office visit with [place] in 8 weeks  I discussed my findings and recommendations with Janet Mitchell at [phone]     Colon Polyps  Polyps are tissue growths inside the body. Polyps can grow in many places, including the large intestine (colon). A polyp may be a round bump or a mushroom-shaped growth. You could have one polyp or several. Most colon polyps are noncancerous (benign). However, some colon polyps can become cancerous over time. Finding and removing the polyps early can help prevent this. What are the causes? The exact cause of colon polyps is not known. What increases the risk? You are more likely to develop this condition if you:  Have a family history of colon cancer or colon polyps.  Are older than 50 or older than 45 if  you are African American.  Have inflammatory bowel disease, such as ulcerative colitis or Crohn's disease.  Have certain hereditary conditions, such as: ? Familial adenomatous polyposis. ? Lynch syndrome. ? Turcot syndrome. ? Peutz-Jeghers syndrome.  Are overweight.  Smoke cigarettes.  Do not get enough exercise.  Drink too much alcohol.  Eat a diet that is high in fat and red meat and low in fiber.  Had childhood cancer that was treated with abdominal radiation. What are the signs or symptoms? Most polyps do not cause  symptoms. If you have symptoms, they may include:  Blood coming from your rectum when having a bowel movement.  Blood in your stool. The stool may look dark red or black.  Abdominal pain.  A change in bowel habits, such as constipation or diarrhea. How is this diagnosed? This condition is diagnosed with a colonoscopy. This is a procedure in which a lighted, flexible scope is inserted into the anus and then passed into the colon to examine the area. Polyps are sometimes found when a colonoscopy is done as part of routine cancer screening tests. How is this treated? Treatment for this condition involves removing any polyps that are found. Most polyps can be removed during a colonoscopy. Those polyps will then be tested for cancer. Additional treatment may be needed depending on the results of testing. Follow these instructions at home: Lifestyle  Maintain a healthy weight, or lose weight if recommended by your health care provider.  Exercise every day or as told by your health care provider.  Do not use any products that contain nicotine or tobacco, such as cigarettes and e-cigarettes. If you need help quitting, ask your health care provider.  If you drink alcohol, limit how much you have: ? 0-1 drink a day for women. ? 0-2 drinks a day for men.  Be aware of how much alcohol is in your drink. In the [place] one drink equals one 12 oz bottle of beer (355 mL), one 5 oz glass of wine (148 mL), or one 1 oz shot of hard liquor (44 mL). Eating and drinking   Eat foods that are high in fiber, such as fruits, vegetables, and whole grains.  Eat foods that are high in calcium and vitamin D, such as milk, cheese, yogurt, eggs, liver, fish, and broccoli.  Limit foods that are high in fat, such as fried foods and desserts.  Limit the amount of red meat and processed meat you eat, such as hot dogs, sausage, bacon, and lunch meats. General instructions  Keep all follow-up visits as told by your  health care provider. This is important. ? This includes having regularly scheduled colonoscopies. ? Talk to your health care provider about when you need a colonoscopy. Contact a health care provider if:  You have new or worsening bleeding during a bowel movement.  You have new or increased blood in your stool.  You have a change in bowel habits.  You lose weight for no known reason. Summary  Polyps are tissue growths inside the body. Polyps can grow in many places, including the colon.  Most colon polyps are noncancerous (benign), but some can become cancerous over time.  This condition is diagnosed with a colonoscopy.  Treatment for this condition involves removing any polyps that are found. Most polyps can be removed during a colonoscopy. This information is not intended to replace advice given to you by your health care provider. Make sure you discuss any questions you have with your health  care provider. Document Released: 12/09/2003 Document Revised: 06/29/2017 Document Reviewed: 06/29/2017 Elsevier Patient Education  2020 [employer].

## 2018-10-24 NOTE — H&P (Signed)
@LOGO @   Primary Care Physician:  Lucianne Lei, MD Primary Gastroenterologist:  Dr. Gala Romney  Pre-Procedure History & Physical: HPI:  Janet Mitchell is a 53 y.o. female here for first ever colonoscopy to further evaluate chronic diarrhea.  No rectal bleeding.  No family history of colon cancer.  Colestid caused abdominal cramps did not help diarrhea.  Past Medical History:  Diagnosis Date  . Anemia   . CHF (congestive heart failure) ([place])   . Chronic cholecystitis with calculus   . ESRD (end stage renal disease) on dialysis [facility])    "peritoneal dialysis q hs" (03/09/2018)  . Headache    "a few/wk" (03/09/2018)  . History of blood transfusion 10/2017   "low blood count" (03/09/2018)  . Hypertension   . Spinal headache   . Type II diabetes mellitus ([place])     Past Surgical History:  Procedure Laterality Date  . AMPUTATION TOE Left 2013   Great toe  . CATARACT EXTRACTION W/ INTRAOCULAR LENS IMPLANT Right   . CESAREAN SECTION  1994; 1998  . CHOLECYSTECTOMY N/A 03/09/2018   Procedure: LAPAROSCOPIC CHOLECYSTECTOMY WITH INTRAOPERATIVE CHOLANGIOGRAM ERAS PATHWAY;  Surgeon: Donnie Mesa, MD;  Location: [place];  Service: General;  Laterality: N/A;  . EYE SURGERY  05/15/2018   Removal of blood in the globe (due to DM)  . FLEXIBLE SIGMOIDOSCOPY N/A 11/24/2017   Procedure: FLEXIBLE SIGMOIDOSCOPY;  Surgeon: Daneil Dolin, MD;  [facility];  Service: Endoscopy;  Laterality: N/A;  . JOINT REPLACEMENT    . LAPAROSCOPIC CHOLECYSTECTOMY  03/09/2018  . PERITONEAL CATHETER INSERTION  2017  . TOTAL HIP ARTHROPLASTY Left 1997    Prior to Admission medications   Medication Sig Start Date End Date Taking? Authorizing Provider  ALPRAZolam Duanne Moron) 0.5 MG tablet Take 0.5 mg by mouth at bedtime as needed for anxiety.   Yes [provider]  amLODipine (NORVASC) 10 MG tablet Take 1 tablet (10 mg total) by mouth daily. 11/09/17  Yes Emokpae, Courage, MD  aspirin EC 81 MG tablet  Take 81 mg by mouth daily.    Yes [provider]  calcitRIOL (ROCALTROL) 0.5 MCG capsule Take 0.5-1 mcg by mouth See admin instructions. 0.84mcg on Mondays through Fridays and take 66mcg on Saturdays and Sundays   Yes [provider]  Crisaborole (EUCRISA) 2 % OINT Apply 1 application topically daily as needed (for rash/irritation).   Yes [provider]  doxazosin (CARDURA) 8 MG tablet Take 8 mg by mouth at bedtime.   Yes [provider]  Insulin Glargine (TOUJEO MAX SOLOSTAR) 300 UNIT/ML SOPN Inject 14 Units into the skin at bedtime. 11/09/17  Yes Emokpae, Courage, MD  labetalol (NORMODYNE) 200 MG tablet Take 200 mg by mouth 2 (two) times daily.   Yes [provider]  torsemide (DEMADEX) 100 MG tablet Take 1 tablet (100 mg total) by mouth at bedtime. Patient taking differently: Take 100 mg by mouth daily.  11/09/17  Yes Roxan Hockey, MD  cholestyramine (QUESTRAN) 4 g packet Take 1 packet (4 g total) by mouth daily before breakfast. Patient not taking: Reported on 10/17/2018 08/27/18   Erenest Rasher, PA-C  colestipol (COLESTID) 1 g tablet Take 1 tablet (1 g total) by mouth 2 (two) times daily. Patient not taking: Reported on 10/17/2018 08/28/18   Erenest Rasher, PA-C  hydrocortisone (ANUSOL-HC) 2.5 % rectal cream Place 1 application rectally 2 (two) times daily. Patient not taking: Reported on 10/17/2018 08/27/18   Erenest Rasher, PA-C  medroxyPROGESTERone (PROVERA) 10 MG tablet Take 1 tablet (10 mg total) by mouth daily for 10 days. One tablet daily x 10d. Repeat as directed Patient not taking: Reported on 07/31/2018 06/27/18 07/07/18  Jonnie Kind, MD  ondansetron (ZOFRAN ODT) 4 MG disintegrating tablet Take 1 tablet (4 mg total) by mouth every 8 (eight) hours as needed. Patient not taking: Reported on 10/17/2018 07/31/18   Isla Pence, MD    Allergies as of 05/22/2018  . (No Known Allergies)    Family History  Problem Relation Age of Onset   . Heart disease Mother   . Thrombocytopenia Mother        TTP  . Heart failure Father   . Kidney disease Paternal Grandfather   . Colon cancer Neg Hx     Social History   Socioeconomic History  . Marital status: Married    Spouse name: Not on file  . Number of children: 2  . Years of education: Not on file  . Highest education level: Not on file  Occupational History  . Not on file  Social Needs  . Financial resource strain: Not on file  . Food insecurity    Worry: Not on file    Inability: Not on file  . Transportation needs    Medical: Not on file    Non-medical: Not on file  Tobacco Use  . Smoking status: Passive Smoke Exposure - Never Smoker  . Smokeless tobacco: Never Used  Substance and Sexual Activity  . Alcohol use: Never    Frequency: Never  . Drug use: Never  . Sexual activity: Yes    Birth control/protection: None  Lifestyle  . Physical activity    Days per week: Not on file    Minutes per session: Not on file  . Stress: Not on file  Relationships  . Social Herbalist on phone: Not on file    Gets together: Not on file    Attends religious service: Not on file    Active member of club or organization: Not on file    Attends meetings of clubs or organizations: Not on file    Relationship status: Not on file  . Intimate partner violence    Fear of current or ex partner: Not on file    Emotionally abused: Not on file    Physically abused: Not on file    Forced sexual activity: Not on file  Other Topics Concern  . Not on file  Social History Narrative  . Not on file    Review of Systems: See HPI, otherwise negative ROS  Physical Exam: BP (!) 158/85   Pulse 69   Temp 97.6 F (36.4 C) (Oral)   Resp 12   Ht 5\' 8"  (1.727 m)   Wt 116.1 kg   SpO2 100%   BMI 38.92 kg/m  General:   Alert,  Well-developed, well-nourished, pleasant and cooperative in NAD Neck:  Supple; no masses or thyromegaly. No significant cervical  adenopathy. Lungs:  Clear throughout to auscultation.   No wheezes, crackles, or rhonchi. No acute distress. Heart:  Regular rate and rhythm; no murmurs, clicks, rubs,  or gallops. Abdomen: Non-distended, normal bowel sounds.  Soft and nontender without appreciable mass or hepatosplenomegaly.  Pulses:  Normal pulses noted. Extremities:  Without clubbing or edema.  Impression/Plan: 53 year old lady with chronic diarrhea.  Here for ileocolonoscopy to further evaluate per plan.  The risks, benefits, limitations, alternatives and imponderables have been reviewed with the  patient. Questions have been answered. All parties are agreeable.      Notice: This dictation was prepared with Dragon dictation along with smaller phrase technology. Any transcriptional errors that result from this process are unintentional and may not be corrected upon review.

## 2018-10-24 NOTE — Op Note (Signed)
[facility] Patient Name: Janet Mitchell Pinnix Procedure Date: 10/24/2018 8:20 AM MRN: [id number] Date of Birth: 10/13/1965 Attending MD: Norvel Richards , MD CSN: [id number] Age: 53 Admit Type: Outpatient Procedure:                Colonoscopy Indications:              Chronic diarrhea Providers:                Norvel Richards, MD, Lurline Del, RN, Nelma Rothman, Technician Referring MD:             Elyn Peers MD Medicines:                Propofol per Anesthesia, Midazolam 7 mg IV,                            Meperidine 15 mg IV Complications:            No immediate complications. Estimated Blood Loss:     Estimated blood loss was minimal. Procedure:                Pre-Anesthesia Assessment:                           - Prior to the procedure, a History and Physical                            was performed, and patient medications and                            allergies were reviewed. The patient's tolerance of                            previous anesthesia was also reviewed. The risks                            and benefits of the procedure and the sedation                            options and risks were discussed with the patient.                            All questions were answered, and informed consent                            was obtained. Prior Anticoagulants: The patient has                            taken no previous anticoagulant or antiplatelet                            agents. ASA Grade Assessment: III - A patient with  severe systemic disease. After reviewing the risks                            and benefits, the patient was deemed in                            satisfactory condition to undergo the procedure.                           After obtaining informed consent, the colonoscope                            was passed under direct vision. Throughout the                            procedure, the  patient's blood pressure, pulse, and                            oxygen saturations were monitored continuously. The                            CF-HQ190L ([id number]) scope was introduced through                            the anus and advanced to the the ileocecal valve.                            The colonoscopy was performed without difficulty.                            The patient tolerated the procedure well. The                            quality of the bowel preparation was adequate. I                            attempted to intubate the terminal ileum but could                            not do so because of recurrent looping. Scope In: 8:34:11 AM Scope Out: 8:57:20 AM Scope Withdrawal Time: 0 hours 18 minutes 32 seconds  Total Procedure Duration: 0 hours 23 minutes 9 seconds  Findings:      The perianal and digital rectal examinations were normal.      A 15 mm polyp was found in the proximal transverse colon. The polyp was       pedunculated. The polyp was removed with a hot snare. Resection and       retrieval were complete. Estimated blood loss: none.      The exam was otherwise without abnormality on direct and retroflexion       views. Segmental biopsies of the right and left colon taken for       histologic study. Impression:               - One 15 mm polyp in  the proximal transverse colon,                            removed with a hot snare. Resected and retrieved.                            Status post segmental biopsy                           - The examination was otherwise normal on direct                            and retroflexion views. Moderate Sedation:      Moderate (conscious) sedation was administered by the endoscopy nurse       and supervised by the endoscopist. The following parameters were       monitored: oxygen saturation, heart rate, blood pressure, respiratory       rate, EKG, adequacy of pulmonary ventilation, and response to care.       Total physician  intraservice time was 29 minutes. Recommendation:           - Patient has a contact number available for                            emergencies. The signs and symptoms of potential                            delayed complications were discussed with the                            patient. Return to normal activities tomorrow.                            Written discharge instructions were provided to the                            patient.                           - Advance diet as tolerated.                           - Continue present medications. Follow-up on                            pathology.                           - Repeat colonoscopy date to be determined after                            pending pathology results are reviewed for                            surveillance.                           -  Return to GI office in 3 months. Procedure Code(s):        --- Professional ---                           [phone], Colonoscopy, flexible; with removal of                            tumor(s), polyp(s), or other lesion(s) by snare                            technique                           99153, Moderate sedation; each additional 15                            minutes intraservice time                           G0500, Moderate sedation services provided by the                            same physician or other qualified health care                            professional performing a gastrointestinal                            endoscopic service that sedation supports,                            requiring the presence of an independent trained                            observer to assist in the monitoring of the                            patient's level of consciousness and physiological                            status; initial 15 minutes of intra-service time;                            patient age 61 years or older (additional time may                            be reported with  [phone], as appropriate) Diagnosis Code(s):        --- Professional ---                           K63.5, Polyp of colon                           K52.9, Noninfective gastroenteritis and colitis,  unspecified CPT copyright 2019 American Medical Association. All rights reserved. The codes documented in this report are preliminary and upon coder review may  be revised to meet current compliance requirements. Cristopher Estimable. Viraat Vanpatten, MD Norvel Richards, MD 10/24/2018 9:06:17 AM This report has been signed electronically. Number of Addenda: 0

## 2018-10-25 DIAGNOSIS — N2581 Secondary hyperparathyroidism of renal origin: Secondary | ICD-10-CM | POA: Diagnosis not present

## 2018-10-25 DIAGNOSIS — D509 Iron deficiency anemia, unspecified: Secondary | ICD-10-CM | POA: Diagnosis not present

## 2018-10-25 DIAGNOSIS — N186 End stage renal disease: Secondary | ICD-10-CM | POA: Diagnosis not present

## 2018-10-25 DIAGNOSIS — Z992 Dependence on renal dialysis: Secondary | ICD-10-CM | POA: Diagnosis not present

## 2018-10-25 DIAGNOSIS — D631 Anemia in chronic kidney disease: Secondary | ICD-10-CM | POA: Diagnosis not present

## 2018-10-26 DIAGNOSIS — N186 End stage renal disease: Secondary | ICD-10-CM | POA: Diagnosis not present

## 2018-10-26 DIAGNOSIS — D509 Iron deficiency anemia, unspecified: Secondary | ICD-10-CM | POA: Diagnosis not present

## 2018-10-26 DIAGNOSIS — N2581 Secondary hyperparathyroidism of renal origin: Secondary | ICD-10-CM | POA: Diagnosis not present

## 2018-10-26 DIAGNOSIS — D631 Anemia in chronic kidney disease: Secondary | ICD-10-CM | POA: Diagnosis not present

## 2018-10-26 DIAGNOSIS — Z992 Dependence on renal dialysis: Secondary | ICD-10-CM | POA: Diagnosis not present

## 2018-10-27 DIAGNOSIS — D509 Iron deficiency anemia, unspecified: Secondary | ICD-10-CM | POA: Diagnosis not present

## 2018-10-27 DIAGNOSIS — Z992 Dependence on renal dialysis: Secondary | ICD-10-CM | POA: Diagnosis not present

## 2018-10-27 DIAGNOSIS — N186 End stage renal disease: Secondary | ICD-10-CM | POA: Diagnosis not present

## 2018-10-27 DIAGNOSIS — D631 Anemia in chronic kidney disease: Secondary | ICD-10-CM | POA: Diagnosis not present

## 2018-10-28 DIAGNOSIS — N186 End stage renal disease: Secondary | ICD-10-CM | POA: Diagnosis not present

## 2018-10-28 DIAGNOSIS — D631 Anemia in chronic kidney disease: Secondary | ICD-10-CM | POA: Diagnosis not present

## 2018-10-28 DIAGNOSIS — Z992 Dependence on renal dialysis: Secondary | ICD-10-CM | POA: Diagnosis not present

## 2018-10-28 DIAGNOSIS — D509 Iron deficiency anemia, unspecified: Secondary | ICD-10-CM | POA: Diagnosis not present

## 2018-10-29 DIAGNOSIS — D509 Iron deficiency anemia, unspecified: Secondary | ICD-10-CM | POA: Diagnosis not present

## 2018-10-29 DIAGNOSIS — D631 Anemia in chronic kidney disease: Secondary | ICD-10-CM | POA: Diagnosis not present

## 2018-10-29 DIAGNOSIS — N186 End stage renal disease: Secondary | ICD-10-CM | POA: Diagnosis not present

## 2018-10-29 DIAGNOSIS — Z992 Dependence on renal dialysis: Secondary | ICD-10-CM | POA: Diagnosis not present

## 2018-10-29 NOTE — Progress Notes (Signed)
Patient came in today to pick up diabetic shoes and custom inserts.  Same was well pleased with fit and function.   The patient could ambulate without any discomfort; there were no signs of any quality issues. The foot ortheses offered full contact with plantar surface and contoured the arch well.   The shoes fit well with no heel slippage and areas of pressure concern.   Patient advised to contact us if any problems arise.  Patient also advised on how to report any issues.Patient came in today to pick up diabetic shoes and custom inserts.  Same was well pleased with fit and function.   The patient could ambulate without any discomfort; there were no signs of any quality issues. The foot ortheses offered full contact with plantar surface and contoured the arch well.   The shoes fit well with no heel slippage and areas of pressure concern.   Patient advised to contact us if any problems arise.  Patient also advised on how to report any issues.

## 2018-10-30 DIAGNOSIS — Z992 Dependence on renal dialysis: Secondary | ICD-10-CM | POA: Diagnosis not present

## 2018-10-30 DIAGNOSIS — D509 Iron deficiency anemia, unspecified: Secondary | ICD-10-CM | POA: Diagnosis not present

## 2018-10-30 DIAGNOSIS — N186 End stage renal disease: Secondary | ICD-10-CM | POA: Diagnosis not present

## 2018-10-30 DIAGNOSIS — D631 Anemia in chronic kidney disease: Secondary | ICD-10-CM | POA: Diagnosis not present

## 2018-10-31 DIAGNOSIS — N186 End stage renal disease: Secondary | ICD-10-CM | POA: Diagnosis not present

## 2018-10-31 DIAGNOSIS — Z992 Dependence on renal dialysis: Secondary | ICD-10-CM | POA: Diagnosis not present

## 2018-10-31 DIAGNOSIS — D509 Iron deficiency anemia, unspecified: Secondary | ICD-10-CM | POA: Diagnosis not present

## 2018-10-31 DIAGNOSIS — D631 Anemia in chronic kidney disease: Secondary | ICD-10-CM | POA: Diagnosis not present

## 2018-11-01 ENCOUNTER — Encounter (HOSPITAL_COMMUNITY): Payer: Self-pay | Admitting: Internal Medicine

## 2018-11-01 DIAGNOSIS — Z992 Dependence on renal dialysis: Secondary | ICD-10-CM | POA: Diagnosis not present

## 2018-11-01 DIAGNOSIS — D509 Iron deficiency anemia, unspecified: Secondary | ICD-10-CM | POA: Diagnosis not present

## 2018-11-01 DIAGNOSIS — N186 End stage renal disease: Secondary | ICD-10-CM | POA: Diagnosis not present

## 2018-11-01 DIAGNOSIS — D631 Anemia in chronic kidney disease: Secondary | ICD-10-CM | POA: Diagnosis not present

## 2018-11-02 DIAGNOSIS — I120 Hypertensive chronic kidney disease with stage 5 chronic kidney disease or end stage renal disease: Secondary | ICD-10-CM | POA: Diagnosis not present

## 2018-11-02 DIAGNOSIS — E1122 Type 2 diabetes mellitus with diabetic chronic kidney disease: Secondary | ICD-10-CM | POA: Diagnosis not present

## 2018-11-02 DIAGNOSIS — Z794 Long term (current) use of insulin: Secondary | ICD-10-CM | POA: Diagnosis not present

## 2018-11-02 DIAGNOSIS — E785 Hyperlipidemia, unspecified: Secondary | ICD-10-CM | POA: Diagnosis not present

## 2018-11-02 DIAGNOSIS — N186 End stage renal disease: Secondary | ICD-10-CM | POA: Diagnosis not present

## 2018-11-02 DIAGNOSIS — D631 Anemia in chronic kidney disease: Secondary | ICD-10-CM | POA: Diagnosis not present

## 2018-11-02 DIAGNOSIS — R9439 Abnormal result of other cardiovascular function study: Secondary | ICD-10-CM | POA: Diagnosis not present

## 2018-11-02 DIAGNOSIS — Z992 Dependence on renal dialysis: Secondary | ICD-10-CM | POA: Diagnosis not present

## 2018-11-02 DIAGNOSIS — E119 Type 2 diabetes mellitus without complications: Secondary | ICD-10-CM | POA: Diagnosis not present

## 2018-11-02 DIAGNOSIS — Z01818 Encounter for other preprocedural examination: Secondary | ICD-10-CM | POA: Diagnosis not present

## 2018-11-02 DIAGNOSIS — D509 Iron deficiency anemia, unspecified: Secondary | ICD-10-CM | POA: Diagnosis not present

## 2018-11-03 DIAGNOSIS — D631 Anemia in chronic kidney disease: Secondary | ICD-10-CM | POA: Diagnosis not present

## 2018-11-03 DIAGNOSIS — Z992 Dependence on renal dialysis: Secondary | ICD-10-CM | POA: Diagnosis not present

## 2018-11-03 DIAGNOSIS — N186 End stage renal disease: Secondary | ICD-10-CM | POA: Diagnosis not present

## 2018-11-03 DIAGNOSIS — D509 Iron deficiency anemia, unspecified: Secondary | ICD-10-CM | POA: Diagnosis not present

## 2018-11-04 DIAGNOSIS — N186 End stage renal disease: Secondary | ICD-10-CM | POA: Diagnosis not present

## 2018-11-04 DIAGNOSIS — Z992 Dependence on renal dialysis: Secondary | ICD-10-CM | POA: Diagnosis not present

## 2018-11-04 DIAGNOSIS — D509 Iron deficiency anemia, unspecified: Secondary | ICD-10-CM | POA: Diagnosis not present

## 2018-11-04 DIAGNOSIS — D631 Anemia in chronic kidney disease: Secondary | ICD-10-CM | POA: Diagnosis not present

## 2018-11-05 DIAGNOSIS — E113412 Type 2 diabetes mellitus with severe nonproliferative diabetic retinopathy with macular edema, left eye: Secondary | ICD-10-CM | POA: Diagnosis not present

## 2018-11-05 DIAGNOSIS — E113411 Type 2 diabetes mellitus with severe nonproliferative diabetic retinopathy with macular edema, right eye: Secondary | ICD-10-CM | POA: Diagnosis not present

## 2018-11-05 DIAGNOSIS — H4311 Vitreous hemorrhage, right eye: Secondary | ICD-10-CM | POA: Diagnosis not present

## 2018-11-05 DIAGNOSIS — Z992 Dependence on renal dialysis: Secondary | ICD-10-CM | POA: Diagnosis not present

## 2018-11-05 DIAGNOSIS — D631 Anemia in chronic kidney disease: Secondary | ICD-10-CM | POA: Diagnosis not present

## 2018-11-05 DIAGNOSIS — N186 End stage renal disease: Secondary | ICD-10-CM | POA: Diagnosis not present

## 2018-11-05 DIAGNOSIS — D509 Iron deficiency anemia, unspecified: Secondary | ICD-10-CM | POA: Diagnosis not present

## 2018-11-06 DIAGNOSIS — D509 Iron deficiency anemia, unspecified: Secondary | ICD-10-CM | POA: Diagnosis not present

## 2018-11-06 DIAGNOSIS — D631 Anemia in chronic kidney disease: Secondary | ICD-10-CM | POA: Diagnosis not present

## 2018-11-06 DIAGNOSIS — Z992 Dependence on renal dialysis: Secondary | ICD-10-CM | POA: Diagnosis not present

## 2018-11-06 DIAGNOSIS — N186 End stage renal disease: Secondary | ICD-10-CM | POA: Diagnosis not present

## 2018-11-07 DIAGNOSIS — D631 Anemia in chronic kidney disease: Secondary | ICD-10-CM | POA: Diagnosis not present

## 2018-11-07 DIAGNOSIS — D509 Iron deficiency anemia, unspecified: Secondary | ICD-10-CM | POA: Diagnosis not present

## 2018-11-07 DIAGNOSIS — Z992 Dependence on renal dialysis: Secondary | ICD-10-CM | POA: Diagnosis not present

## 2018-11-07 DIAGNOSIS — N186 End stage renal disease: Secondary | ICD-10-CM | POA: Diagnosis not present

## 2018-11-08 DIAGNOSIS — N186 End stage renal disease: Secondary | ICD-10-CM | POA: Diagnosis not present

## 2018-11-08 DIAGNOSIS — D631 Anemia in chronic kidney disease: Secondary | ICD-10-CM | POA: Diagnosis not present

## 2018-11-08 DIAGNOSIS — Z992 Dependence on renal dialysis: Secondary | ICD-10-CM | POA: Diagnosis not present

## 2018-11-08 DIAGNOSIS — D509 Iron deficiency anemia, unspecified: Secondary | ICD-10-CM | POA: Diagnosis not present

## 2018-11-09 DIAGNOSIS — N186 End stage renal disease: Secondary | ICD-10-CM | POA: Diagnosis not present

## 2018-11-09 DIAGNOSIS — Z992 Dependence on renal dialysis: Secondary | ICD-10-CM | POA: Diagnosis not present

## 2018-11-09 DIAGNOSIS — D631 Anemia in chronic kidney disease: Secondary | ICD-10-CM | POA: Diagnosis not present

## 2018-11-09 DIAGNOSIS — D509 Iron deficiency anemia, unspecified: Secondary | ICD-10-CM | POA: Diagnosis not present

## 2018-11-10 DIAGNOSIS — D509 Iron deficiency anemia, unspecified: Secondary | ICD-10-CM | POA: Diagnosis not present

## 2018-11-10 DIAGNOSIS — N186 End stage renal disease: Secondary | ICD-10-CM | POA: Diagnosis not present

## 2018-11-10 DIAGNOSIS — D631 Anemia in chronic kidney disease: Secondary | ICD-10-CM | POA: Diagnosis not present

## 2018-11-10 DIAGNOSIS — Z992 Dependence on renal dialysis: Secondary | ICD-10-CM | POA: Diagnosis not present

## 2018-11-11 DIAGNOSIS — Z992 Dependence on renal dialysis: Secondary | ICD-10-CM | POA: Diagnosis not present

## 2018-11-11 DIAGNOSIS — N186 End stage renal disease: Secondary | ICD-10-CM | POA: Diagnosis not present

## 2018-11-11 DIAGNOSIS — D631 Anemia in chronic kidney disease: Secondary | ICD-10-CM | POA: Diagnosis not present

## 2018-11-11 DIAGNOSIS — D509 Iron deficiency anemia, unspecified: Secondary | ICD-10-CM | POA: Diagnosis not present

## 2018-11-12 DIAGNOSIS — Z992 Dependence on renal dialysis: Secondary | ICD-10-CM | POA: Diagnosis not present

## 2018-11-12 DIAGNOSIS — D631 Anemia in chronic kidney disease: Secondary | ICD-10-CM | POA: Diagnosis not present

## 2018-11-12 DIAGNOSIS — D509 Iron deficiency anemia, unspecified: Secondary | ICD-10-CM | POA: Diagnosis not present

## 2018-11-12 DIAGNOSIS — N186 End stage renal disease: Secondary | ICD-10-CM | POA: Diagnosis not present

## 2018-11-13 DIAGNOSIS — N186 End stage renal disease: Secondary | ICD-10-CM | POA: Diagnosis not present

## 2018-11-13 DIAGNOSIS — Z992 Dependence on renal dialysis: Secondary | ICD-10-CM | POA: Diagnosis not present

## 2018-11-13 DIAGNOSIS — D631 Anemia in chronic kidney disease: Secondary | ICD-10-CM | POA: Diagnosis not present

## 2018-11-13 DIAGNOSIS — D509 Iron deficiency anemia, unspecified: Secondary | ICD-10-CM | POA: Diagnosis not present

## 2018-11-14 DIAGNOSIS — R9439 Abnormal result of other cardiovascular function study: Secondary | ICD-10-CM | POA: Diagnosis not present

## 2018-11-14 DIAGNOSIS — Z7682 Awaiting organ transplant status: Secondary | ICD-10-CM | POA: Diagnosis not present

## 2018-11-14 DIAGNOSIS — I2510 Atherosclerotic heart disease of native coronary artery without angina pectoris: Secondary | ICD-10-CM | POA: Diagnosis not present

## 2018-11-14 DIAGNOSIS — Z992 Dependence on renal dialysis: Secondary | ICD-10-CM | POA: Diagnosis not present

## 2018-11-14 DIAGNOSIS — N186 End stage renal disease: Secondary | ICD-10-CM | POA: Diagnosis not present

## 2018-11-14 DIAGNOSIS — D509 Iron deficiency anemia, unspecified: Secondary | ICD-10-CM | POA: Diagnosis not present

## 2018-11-14 DIAGNOSIS — D631 Anemia in chronic kidney disease: Secondary | ICD-10-CM | POA: Diagnosis not present

## 2018-11-15 DIAGNOSIS — Z992 Dependence on renal dialysis: Secondary | ICD-10-CM | POA: Diagnosis not present

## 2018-11-15 DIAGNOSIS — D509 Iron deficiency anemia, unspecified: Secondary | ICD-10-CM | POA: Diagnosis not present

## 2018-11-15 DIAGNOSIS — N186 End stage renal disease: Secondary | ICD-10-CM | POA: Diagnosis not present

## 2018-11-15 DIAGNOSIS — D631 Anemia in chronic kidney disease: Secondary | ICD-10-CM | POA: Diagnosis not present

## 2018-11-16 DIAGNOSIS — Z992 Dependence on renal dialysis: Secondary | ICD-10-CM | POA: Diagnosis not present

## 2018-11-16 DIAGNOSIS — N186 End stage renal disease: Secondary | ICD-10-CM | POA: Diagnosis not present

## 2018-11-16 DIAGNOSIS — D631 Anemia in chronic kidney disease: Secondary | ICD-10-CM | POA: Diagnosis not present

## 2018-11-16 DIAGNOSIS — D509 Iron deficiency anemia, unspecified: Secondary | ICD-10-CM | POA: Diagnosis not present

## 2018-11-17 DIAGNOSIS — N186 End stage renal disease: Secondary | ICD-10-CM | POA: Diagnosis not present

## 2018-11-17 DIAGNOSIS — D631 Anemia in chronic kidney disease: Secondary | ICD-10-CM | POA: Diagnosis not present

## 2018-11-17 DIAGNOSIS — Z992 Dependence on renal dialysis: Secondary | ICD-10-CM | POA: Diagnosis not present

## 2018-11-17 DIAGNOSIS — D509 Iron deficiency anemia, unspecified: Secondary | ICD-10-CM | POA: Diagnosis not present

## 2018-11-18 DIAGNOSIS — N186 End stage renal disease: Secondary | ICD-10-CM | POA: Diagnosis not present

## 2018-11-18 DIAGNOSIS — Z992 Dependence on renal dialysis: Secondary | ICD-10-CM | POA: Diagnosis not present

## 2018-11-18 DIAGNOSIS — D631 Anemia in chronic kidney disease: Secondary | ICD-10-CM | POA: Diagnosis not present

## 2018-11-18 DIAGNOSIS — D509 Iron deficiency anemia, unspecified: Secondary | ICD-10-CM | POA: Diagnosis not present

## 2018-11-19 DIAGNOSIS — N186 End stage renal disease: Secondary | ICD-10-CM | POA: Diagnosis not present

## 2018-11-19 DIAGNOSIS — D631 Anemia in chronic kidney disease: Secondary | ICD-10-CM | POA: Diagnosis not present

## 2018-11-19 DIAGNOSIS — D509 Iron deficiency anemia, unspecified: Secondary | ICD-10-CM | POA: Diagnosis not present

## 2018-11-19 DIAGNOSIS — Z992 Dependence on renal dialysis: Secondary | ICD-10-CM | POA: Diagnosis not present

## 2018-11-20 DIAGNOSIS — N186 End stage renal disease: Secondary | ICD-10-CM | POA: Diagnosis not present

## 2018-11-20 DIAGNOSIS — D631 Anemia in chronic kidney disease: Secondary | ICD-10-CM | POA: Diagnosis not present

## 2018-11-20 DIAGNOSIS — D509 Iron deficiency anemia, unspecified: Secondary | ICD-10-CM | POA: Diagnosis not present

## 2018-11-20 DIAGNOSIS — Z992 Dependence on renal dialysis: Secondary | ICD-10-CM | POA: Diagnosis not present

## 2018-11-21 DIAGNOSIS — D631 Anemia in chronic kidney disease: Secondary | ICD-10-CM | POA: Diagnosis not present

## 2018-11-21 DIAGNOSIS — Z992 Dependence on renal dialysis: Secondary | ICD-10-CM | POA: Diagnosis not present

## 2018-11-21 DIAGNOSIS — N186 End stage renal disease: Secondary | ICD-10-CM | POA: Diagnosis not present

## 2018-11-21 DIAGNOSIS — D509 Iron deficiency anemia, unspecified: Secondary | ICD-10-CM | POA: Diagnosis not present

## 2018-11-22 DIAGNOSIS — D631 Anemia in chronic kidney disease: Secondary | ICD-10-CM | POA: Diagnosis not present

## 2018-11-22 DIAGNOSIS — D509 Iron deficiency anemia, unspecified: Secondary | ICD-10-CM | POA: Diagnosis not present

## 2018-11-22 DIAGNOSIS — Z992 Dependence on renal dialysis: Secondary | ICD-10-CM | POA: Diagnosis not present

## 2018-11-22 DIAGNOSIS — N186 End stage renal disease: Secondary | ICD-10-CM | POA: Diagnosis not present

## 2018-11-23 DIAGNOSIS — D509 Iron deficiency anemia, unspecified: Secondary | ICD-10-CM | POA: Diagnosis not present

## 2018-11-23 DIAGNOSIS — N186 End stage renal disease: Secondary | ICD-10-CM | POA: Diagnosis not present

## 2018-11-23 DIAGNOSIS — D631 Anemia in chronic kidney disease: Secondary | ICD-10-CM | POA: Diagnosis not present

## 2018-11-23 DIAGNOSIS — Z992 Dependence on renal dialysis: Secondary | ICD-10-CM | POA: Diagnosis not present

## 2018-11-24 DIAGNOSIS — N186 End stage renal disease: Secondary | ICD-10-CM | POA: Diagnosis not present

## 2018-11-24 DIAGNOSIS — D631 Anemia in chronic kidney disease: Secondary | ICD-10-CM | POA: Diagnosis not present

## 2018-11-24 DIAGNOSIS — D509 Iron deficiency anemia, unspecified: Secondary | ICD-10-CM | POA: Diagnosis not present

## 2018-11-24 DIAGNOSIS — Z992 Dependence on renal dialysis: Secondary | ICD-10-CM | POA: Diagnosis not present

## 2018-11-25 DIAGNOSIS — N186 End stage renal disease: Secondary | ICD-10-CM | POA: Diagnosis not present

## 2018-11-25 DIAGNOSIS — D509 Iron deficiency anemia, unspecified: Secondary | ICD-10-CM | POA: Diagnosis not present

## 2018-11-25 DIAGNOSIS — D631 Anemia in chronic kidney disease: Secondary | ICD-10-CM | POA: Diagnosis not present

## 2018-11-25 DIAGNOSIS — Z992 Dependence on renal dialysis: Secondary | ICD-10-CM | POA: Diagnosis not present

## 2018-11-26 DIAGNOSIS — N186 End stage renal disease: Secondary | ICD-10-CM | POA: Diagnosis not present

## 2018-11-26 DIAGNOSIS — D631 Anemia in chronic kidney disease: Secondary | ICD-10-CM | POA: Diagnosis not present

## 2018-11-26 DIAGNOSIS — D509 Iron deficiency anemia, unspecified: Secondary | ICD-10-CM | POA: Diagnosis not present

## 2018-11-26 DIAGNOSIS — Z992 Dependence on renal dialysis: Secondary | ICD-10-CM | POA: Diagnosis not present

## 2018-11-27 DIAGNOSIS — D509 Iron deficiency anemia, unspecified: Secondary | ICD-10-CM | POA: Diagnosis not present

## 2018-11-27 DIAGNOSIS — D631 Anemia in chronic kidney disease: Secondary | ICD-10-CM | POA: Diagnosis not present

## 2018-11-27 DIAGNOSIS — N186 End stage renal disease: Secondary | ICD-10-CM | POA: Diagnosis not present

## 2018-11-27 DIAGNOSIS — Z992 Dependence on renal dialysis: Secondary | ICD-10-CM | POA: Diagnosis not present

## 2018-11-28 ENCOUNTER — Encounter: Payer: Self-pay | Admitting: Nurse Practitioner

## 2018-11-28 ENCOUNTER — Other Ambulatory Visit: Payer: Self-pay

## 2018-11-28 ENCOUNTER — Ambulatory Visit (INDEPENDENT_AMBULATORY_CARE_PROVIDER_SITE_OTHER): Payer: Medicare Other | Admitting: Nurse Practitioner

## 2018-11-28 VITALS — BP 149/84 | HR 72 | Temp 97.2°F | Ht 68.0 in | Wt 242.6 lb

## 2018-11-28 DIAGNOSIS — D631 Anemia in chronic kidney disease: Secondary | ICD-10-CM | POA: Diagnosis not present

## 2018-11-28 DIAGNOSIS — R197 Diarrhea, unspecified: Secondary | ICD-10-CM

## 2018-11-28 DIAGNOSIS — D509 Iron deficiency anemia, unspecified: Secondary | ICD-10-CM | POA: Diagnosis not present

## 2018-11-28 DIAGNOSIS — R109 Unspecified abdominal pain: Secondary | ICD-10-CM | POA: Insufficient documentation

## 2018-11-28 DIAGNOSIS — E113413 Type 2 diabetes mellitus with severe nonproliferative diabetic retinopathy with macular edema, bilateral: Secondary | ICD-10-CM | POA: Diagnosis not present

## 2018-11-28 DIAGNOSIS — I10 Essential (primary) hypertension: Secondary | ICD-10-CM | POA: Diagnosis not present

## 2018-11-28 DIAGNOSIS — R1030 Lower abdominal pain, unspecified: Secondary | ICD-10-CM | POA: Diagnosis not present

## 2018-11-28 DIAGNOSIS — Z992 Dependence on renal dialysis: Secondary | ICD-10-CM | POA: Diagnosis not present

## 2018-11-28 DIAGNOSIS — N186 End stage renal disease: Secondary | ICD-10-CM | POA: Diagnosis not present

## 2018-11-28 NOTE — Patient Instructions (Signed)
Your health issues we discussed today were:   Diarrhea and abdominal cramping: 1. Stop taking colestipol 2. I will send in a medication to your pharmacy called Bentyl 5 mg.  Take this about 10 minutes before meals, and before bed 3. Call us in 2 weeks and let us know if it is helping your abdominal pain and diarrhea 4. We can consider increasing the dose or changing to a different medication depending on how you respond 5. Call us if you have any worsening or severe symptoms  Overall I recommend:  1. Continue your other current medications 2. Best of luck with your ongoing transplant work-up! 3. Call us if you have any questions or concerns 4. Follow-up in 3 months   Because of recent events of COVID-19 ("Coronavirus"), follow CDC recommendations:  Wash your hand frequently Avoid touching your face Stay away from people who are sick If you have symptoms such as fever, cough, shortness of breath then call your healthcare provider for further guidance If you are sick, STAY AT HOME unless otherwise directed by your healthcare provider. Follow directions from state and national officials regarding staying safe   At [facility] we value your feedback. You may receive a survey about your visit today. Please share your experience as we strive to create trusting relationships with our patients to provide genuine, compassionate, quality care.  We appreciate your understanding and patience as we review any laboratory studies, imaging, and other diagnostic tests that are ordered as we care for you. Our office policy is 5 business days for review of these results, and any emergent or urgent results are addressed in a timely manner for your best interest. If you do not hear from our office in 1 week, please contact us.   We also encourage the use of MyChart, which contains your medical information for your review as well. If you are not enrolled in this feature, an access code is on  this after visit summary for your convenience. Thank you for allowing [place] to be involved in your care.  It was great to see you today!  I hope you have a great summer!! Good luck with your kidney transplant workup!!!

## 2018-11-28 NOTE — Assessment & Plan Note (Signed)
Persistent diarrhea, colestipol caused abdominal cramping and she has since stopped this.  I will start her on Bentyl 5 mg before meals and at bedtime.  Low dose due to end-stage renal disease.  However, she is on peritoneal dialysis initiated negate any renal insufficiencies.  There is no renal dosing noted on the package insert.  She is to call us with progress report in 2 weeks.  We can consider increasing Bentyl to 10 mg or we can try other medications such as Lomotil.  Follow-up in 3 months.

## 2018-11-28 NOTE — Progress Notes (Signed)
Referring Provider: Lucianne Lei, MD Primary Care Physician:  Lucianne Lei, MD Primary GI:  Dr. Gala Romney  Chief Complaint  Patient presents with  . Diarrhea    everytime she eats she is going within 5-10 min she is running to the bathroom  . Abdominal Pain    HPI:   Janet Mitchell is a 53 y.o. female who presents for follow-up on diarrhea.  The patient last seen in our office 08/27/2018 for diarrhea, rectal burning, rectal bleeding.  Past medical history significant for GERD, ESRD on peritoneal dialysis, anemia, type 2 diabetes, CHF.  Status post cholecystectomy December 2019 and colonoscopy follow-up had poor prep.  She was rescheduled for 2020.  At her last visit she was having persistent diarrhea with urgency since cholecystectomy and typically has 3-5 formed bowel movements a day.  She notes some light pink on the toilet tissue after bowel movement had started the month prior and is typically present with each loose stool.  No frank hematochezia.  Bowel movements typically 10 minutes after eating but may occur without eating.  Has to wear depends due to urgency and "accidents".  Abdominal pain precedes her bowel movement.  After diarrhea stool she will have burning in her rectum.  Multiple doses of Imodium with minimal relief, nausea with peritoneal dialysis in the morning and occasional vomiting without hematemesis or melena.  She is trying to pursue a kidney transplant and will restart testing soon.  Recommended colonoscopy as scheduled, cholestyramine for diarrhea 4 g daily, Imodium as needed, Anusol cream, follow-up in 2 to 3 months.  Attempted to call the patient the next day to change from cholestyramine to colestipol due to possible renal impairment dosing.  Patient underwent colonoscopy 10/24/2018 which found a 15 mm polyp in the proximal transverse colon, otherwise normal.  Status post random colon biopsies, Surgical pathology found the polyp to be tubular adenoma.  Colon biopsies  found to be negative for histopathological changes.  Recommended repeat colonoscopy in 3 years (2023).  Today she states she's doing ok overall. She tried Cholestipol which did generally help her diarrhea, but when she took it regularly it caused abdominal cramping, so she stopped the medication. Still with post-prandial diarrhea within 5-10 minutes of eating. Has a number of stools during the day depending on how often she eats. Has not tried anything for diarrhea besides Imodium. Has been tried on calcium tablets (TUMS) at every meal for phosphorous which has not results in improved stools consistently. Still with some intermittent abdominal pain, but improved. Her diarrhea is affecting her quality of life at this point. Denies hematochezia, melena, fever, chills, unintentional weight loss. Denies URI or flu-like symptoms. Denies loss of sense of taste or smell. Denies chest pain, dyspnea, dizziness, lightheadedness, syncope, near syncope. Denies any other upper or lower GI symptoms.  She ahs been following with [facility] for workup for kidney transplant listing. Had a recent cardiac cath as part of this process which she states looked great. Awaiting next steps.  Past Medical History:  Diagnosis Date  . Anemia   . CHF (congestive heart failure) ([place])   . Chronic cholecystitis with calculus   . ESRD (end stage renal disease) on dialysis [facility])    "peritoneal dialysis q hs" (03/09/2018)  . Headache    "a few/wk" (03/09/2018)  . History of blood transfusion 10/2017   "low blood count" (03/09/2018)  . Hypertension   . Spinal headache   . Type II diabetes mellitus ([place])  Past Surgical History:  Procedure Laterality Date  . AMPUTATION TOE Left 2013   Great toe  . BIOPSY  10/24/2018   Procedure: BIOPSY;  Surgeon: Daneil Dolin, MD;  [facility];  Service: Endoscopy;;  right and left colon  . CATARACT EXTRACTION W/ INTRAOCULAR LENS IMPLANT Right   . CESAREAN SECTION   1994; 1998  . CHOLECYSTECTOMY N/A 03/09/2018   Procedure: LAPAROSCOPIC CHOLECYSTECTOMY WITH INTRAOPERATIVE CHOLANGIOGRAM ERAS PATHWAY;  Surgeon: Donnie Mesa, MD;  Location: [place];  Service: General;  Laterality: N/A;  . COLONOSCOPY N/A 10/24/2018   Procedure: COLONOSCOPY;  Surgeon: Daneil Dolin, MD;  [facility];  Service: Endoscopy;  Laterality: N/A;  1:00pm  . EYE SURGERY  05/15/2018   Removal of blood in the globe (due to DM)  . FLEXIBLE SIGMOIDOSCOPY N/A 11/24/2017   Procedure: FLEXIBLE SIGMOIDOSCOPY;  Surgeon: Daneil Dolin, MD;  [facility];  Service: Endoscopy;  Laterality: N/A;  . LAPAROSCOPIC CHOLECYSTECTOMY  03/09/2018  . PERITONEAL CATHETER INSERTION  2017  . POLYPECTOMY  10/24/2018   Procedure: POLYPECTOMY;  Surgeon: Daneil Dolin, MD;  [facility];  Service: Endoscopy;;  . TOTAL HIP ARTHROPLASTY Left 1997    Current Outpatient Medications  Medication Sig Dispense Refill  . ALPRAZolam (XANAX) 0.5 MG tablet Take 0.5 mg by mouth at bedtime as needed for anxiety.    Marland Kitchen amLODipine (NORVASC) 10 MG tablet Take 1 tablet (10 mg total) by mouth daily. 30 tablet 3  . calcitRIOL (ROCALTROL) 0.5 MCG capsule Take 0.5-1 mcg by mouth See admin instructions. 0.40mcg on Mondays through Fridays and take 4mcg on Saturdays and Sundays    . Crisaborole (EUCRISA) 2 % OINT Apply 1 application topically daily as needed (for rash/irritation).    Marland Kitchen doxazosin (CARDURA) 8 MG tablet Take 8 mg by mouth at bedtime.    . Insulin Glargine (TOUJEO MAX SOLOSTAR) 300 UNIT/ML SOPN Inject 14 Units into the skin at bedtime. 3 pen 3  . labetalol (NORMODYNE) 200 MG tablet Take 200 mg by mouth 2 (two) times daily.     No current facility-administered medications for this visit.     Allergies as of 11/28/2018  . (No Known Allergies)    Family History  Problem Relation Age of Onset  . Heart disease Mother   . Thrombocytopenia Mother        TTP  . Heart failure Father    . Kidney disease Paternal Grandfather   . Colon cancer Neg Hx     Social History   Socioeconomic History  . Marital status: Married    Spouse name: Not on file  . Number of children: 2  . Years of education: Not on file  . Highest education level: Not on file  Occupational History  . Not on file  Social Needs  . Financial resource strain: Not on file  . Food insecurity    Worry: Not on file    Inability: Not on file  . Transportation needs    Medical: Not on file    Non-medical: Not on file  Tobacco Use  . Smoking status: Passive Smoke Exposure - Never Smoker  . Smokeless tobacco: Never Used  Substance and Sexual Activity  . Alcohol use: Never    Frequency: Never  . Drug use: Never  . Sexual activity: Yes    Birth control/protection: None  Lifestyle  . Physical activity    Days per week: Not on file  Minutes per session: Not on file  . Stress: Not on file  Relationships  . Social Herbalist on phone: Not on file    Gets together: Not on file    Attends religious service: Not on file    Active member of club or organization: Not on file    Attends meetings of clubs or organizations: Not on file    Relationship status: Not on file  Other Topics Concern  . Not on file  Social History Narrative  . Not on file    Review of Systems: General: Negative for anorexia, weight loss, fever, chills, fatigue, weakness. ENT: Negative for hoarseness, difficulty swallowing. CV: Negative for chest pain, angina, palpitations, dyspnea on exertion.  Respiratory: Negative for dyspnea at rest, dyspnea on exertion, cough, sputum, wheezing.  GI: See history of present illness. Endo: Negative for unusual weight change.  Heme: Negative for bruising or bleeding. Allergy: Negative for rash or hives.   Physical Exam: BP (!) 149/84   Pulse 72   Temp (!) 97.2 F (36.2 C) (Oral)   Ht 5\' 8"  (1.727 m)   Wt 242 lb 9.6 oz (110 kg)   BMI 36.89 kg/m  General:   Alert and  oriented. Pleasant and cooperative. Well-nourished and well-developed.  Eyes:  Without icterus, sclera clear and conjunctiva pink.  Ears:  Normal auditory acuity. Cardiovascular:  S1, S2 present without murmurs appreciated. Extremities without clubbing or edema. Respiratory:  Clear to auscultation bilaterally. No wheezes, rales, or rhonchi. No distress.  Gastrointestinal:  +BS, soft, non-tender and non-distended. No HSM noted. No guarding or rebound. No masses appreciated.  Rectal:  Deferred  Musculoskalatal:  Symmetrical without gross deformities. Skin:  Intact without significant lesions or rashes. PD catheter appreciated RLQ. Neurologic:  Alert and oriented x4;  grossly normal neurologically. Psych:  Alert and cooperative. Normal mood and affect. Heme/Lymph/Immune: No excessive bruising noted.    11/28/2018 9:58 AM   Disclaimer: This note was dictated with voice recognition software. Similar sounding words can inadvertently be transcribed and may not be corrected upon review.

## 2018-11-28 NOTE — Assessment & Plan Note (Signed)
The patient began having worsening abdominal cramping with colestipol.  She is still having some residual abdominal cramping since stopping the medication, but it is much improved.  Recommend she continue to monitor.  Residual abdominal cramping could be related to possible IBS diarrhea.  She is to notify us of any worsening or severe symptoms.  We will start Bentyl for her diarrhea and an ultra low-dose to try to help with abdominal pain and diarrhea.  Can increase dose as needed.  She does have end-stage renal disease but undergoes peritoneal dialysis which should make up for any renal insufficiencies.  Bentyl does not have renal dosing parameters on the package insert.

## 2018-11-29 ENCOUNTER — Telehealth: Payer: Self-pay | Admitting: *Deleted

## 2018-11-29 DIAGNOSIS — Z992 Dependence on renal dialysis: Secondary | ICD-10-CM | POA: Diagnosis not present

## 2018-11-29 DIAGNOSIS — D509 Iron deficiency anemia, unspecified: Secondary | ICD-10-CM | POA: Diagnosis not present

## 2018-11-29 DIAGNOSIS — D631 Anemia in chronic kidney disease: Secondary | ICD-10-CM | POA: Diagnosis not present

## 2018-11-29 DIAGNOSIS — N186 End stage renal disease: Secondary | ICD-10-CM | POA: Diagnosis not present

## 2018-11-29 NOTE — Telephone Encounter (Signed)
EG, please send Bentyl in for pt to [place].

## 2018-11-29 NOTE — Telephone Encounter (Signed)
Pt says [facility] has not received RX for Bentyl.  Can we help?  [phone]

## 2018-11-30 DIAGNOSIS — Z992 Dependence on renal dialysis: Secondary | ICD-10-CM | POA: Diagnosis not present

## 2018-11-30 DIAGNOSIS — D509 Iron deficiency anemia, unspecified: Secondary | ICD-10-CM | POA: Diagnosis not present

## 2018-11-30 DIAGNOSIS — D631 Anemia in chronic kidney disease: Secondary | ICD-10-CM | POA: Diagnosis not present

## 2018-11-30 DIAGNOSIS — N186 End stage renal disease: Secondary | ICD-10-CM | POA: Diagnosis not present

## 2018-12-01 DIAGNOSIS — Z992 Dependence on renal dialysis: Secondary | ICD-10-CM | POA: Diagnosis not present

## 2018-12-01 DIAGNOSIS — N186 End stage renal disease: Secondary | ICD-10-CM | POA: Diagnosis not present

## 2018-12-01 DIAGNOSIS — D509 Iron deficiency anemia, unspecified: Secondary | ICD-10-CM | POA: Diagnosis not present

## 2018-12-01 DIAGNOSIS — D631 Anemia in chronic kidney disease: Secondary | ICD-10-CM | POA: Diagnosis not present

## 2018-12-02 DIAGNOSIS — Z992 Dependence on renal dialysis: Secondary | ICD-10-CM | POA: Diagnosis not present

## 2018-12-02 DIAGNOSIS — D509 Iron deficiency anemia, unspecified: Secondary | ICD-10-CM | POA: Diagnosis not present

## 2018-12-02 DIAGNOSIS — N186 End stage renal disease: Secondary | ICD-10-CM | POA: Diagnosis not present

## 2018-12-02 DIAGNOSIS — D631 Anemia in chronic kidney disease: Secondary | ICD-10-CM | POA: Diagnosis not present

## 2018-12-03 DIAGNOSIS — Z992 Dependence on renal dialysis: Secondary | ICD-10-CM | POA: Diagnosis not present

## 2018-12-03 DIAGNOSIS — D509 Iron deficiency anemia, unspecified: Secondary | ICD-10-CM | POA: Diagnosis not present

## 2018-12-03 DIAGNOSIS — D631 Anemia in chronic kidney disease: Secondary | ICD-10-CM | POA: Diagnosis not present

## 2018-12-03 DIAGNOSIS — N186 End stage renal disease: Secondary | ICD-10-CM | POA: Diagnosis not present

## 2018-12-04 DIAGNOSIS — Z992 Dependence on renal dialysis: Secondary | ICD-10-CM | POA: Diagnosis not present

## 2018-12-04 DIAGNOSIS — D509 Iron deficiency anemia, unspecified: Secondary | ICD-10-CM | POA: Diagnosis not present

## 2018-12-04 DIAGNOSIS — N186 End stage renal disease: Secondary | ICD-10-CM | POA: Diagnosis not present

## 2018-12-04 DIAGNOSIS — D631 Anemia in chronic kidney disease: Secondary | ICD-10-CM | POA: Diagnosis not present

## 2018-12-04 NOTE — Telephone Encounter (Signed)
Left a detailed message, letting pt know her medication should be ready tomorrow per EG.

## 2018-12-04 NOTE — Telephone Encounter (Signed)
Pt following up on RX.  Pt says [facility] still doesn't have [place].  [phone]

## 2018-12-04 NOTE — Telephone Encounter (Signed)
Routing to EG 

## 2018-12-04 NOTE — Telephone Encounter (Signed)
Erlene Quan (pharmacist) if off today. I was able to speak with another pharmacist who is ordering the medication as a syrup (the only way to get a 5 mg dose). It should be ready tomorrow.  Please tell the patient sorry for the delay, it took some work to get the right dose ordered (due to her kidney disease). It should be ready tomorrow.

## 2018-12-04 NOTE — Telephone Encounter (Signed)
Received a VM from [place] at [place]. They will be able to make the Dicyclomine syrup. [phone]

## 2018-12-05 DIAGNOSIS — N186 End stage renal disease: Secondary | ICD-10-CM | POA: Diagnosis not present

## 2018-12-05 DIAGNOSIS — Z992 Dependence on renal dialysis: Secondary | ICD-10-CM | POA: Diagnosis not present

## 2018-12-05 DIAGNOSIS — D631 Anemia in chronic kidney disease: Secondary | ICD-10-CM | POA: Diagnosis not present

## 2018-12-05 DIAGNOSIS — D509 Iron deficiency anemia, unspecified: Secondary | ICD-10-CM | POA: Diagnosis not present

## 2018-12-06 DIAGNOSIS — D631 Anemia in chronic kidney disease: Secondary | ICD-10-CM | POA: Diagnosis not present

## 2018-12-06 DIAGNOSIS — D509 Iron deficiency anemia, unspecified: Secondary | ICD-10-CM | POA: Diagnosis not present

## 2018-12-06 DIAGNOSIS — N186 End stage renal disease: Secondary | ICD-10-CM | POA: Diagnosis not present

## 2018-12-06 DIAGNOSIS — Z992 Dependence on renal dialysis: Secondary | ICD-10-CM | POA: Diagnosis not present

## 2018-12-07 DIAGNOSIS — Z1159 Encounter for screening for other viral diseases: Secondary | ICD-10-CM | POA: Diagnosis not present

## 2018-12-07 DIAGNOSIS — D509 Iron deficiency anemia, unspecified: Secondary | ICD-10-CM | POA: Diagnosis not present

## 2018-12-07 DIAGNOSIS — D631 Anemia in chronic kidney disease: Secondary | ICD-10-CM | POA: Diagnosis not present

## 2018-12-07 DIAGNOSIS — N186 End stage renal disease: Secondary | ICD-10-CM | POA: Diagnosis not present

## 2018-12-07 DIAGNOSIS — Z992 Dependence on renal dialysis: Secondary | ICD-10-CM | POA: Diagnosis not present

## 2018-12-08 DIAGNOSIS — N186 End stage renal disease: Secondary | ICD-10-CM | POA: Diagnosis not present

## 2018-12-08 DIAGNOSIS — D631 Anemia in chronic kidney disease: Secondary | ICD-10-CM | POA: Diagnosis not present

## 2018-12-08 DIAGNOSIS — D509 Iron deficiency anemia, unspecified: Secondary | ICD-10-CM | POA: Diagnosis not present

## 2018-12-08 DIAGNOSIS — Z992 Dependence on renal dialysis: Secondary | ICD-10-CM | POA: Diagnosis not present

## 2018-12-09 DIAGNOSIS — Z992 Dependence on renal dialysis: Secondary | ICD-10-CM | POA: Diagnosis not present

## 2018-12-09 DIAGNOSIS — N186 End stage renal disease: Secondary | ICD-10-CM | POA: Diagnosis not present

## 2018-12-09 DIAGNOSIS — D509 Iron deficiency anemia, unspecified: Secondary | ICD-10-CM | POA: Diagnosis not present

## 2018-12-09 DIAGNOSIS — D631 Anemia in chronic kidney disease: Secondary | ICD-10-CM | POA: Diagnosis not present

## 2018-12-10 DIAGNOSIS — N186 End stage renal disease: Secondary | ICD-10-CM | POA: Diagnosis not present

## 2018-12-10 DIAGNOSIS — D631 Anemia in chronic kidney disease: Secondary | ICD-10-CM | POA: Diagnosis not present

## 2018-12-10 DIAGNOSIS — Z992 Dependence on renal dialysis: Secondary | ICD-10-CM | POA: Diagnosis not present

## 2018-12-10 DIAGNOSIS — D509 Iron deficiency anemia, unspecified: Secondary | ICD-10-CM | POA: Diagnosis not present

## 2018-12-10 IMAGING — US US PELVIS COMPLETE WITH TRANSVAGINAL
1 series · 13 of 25 positions shown · non-contrast
Comparison: None

Correlation: CT abdomen and pelvis [DATE]

CLINICAL DATA: Menorrhagia with irregular menstrual cycles, anemia,
prior Caesarean section x 2, has peritoneal dialysis catheter



[Series 1: us pelvis complete with transvaginal · 0.24mm/px · 13 of 149 slices shown]
[im 1/149]
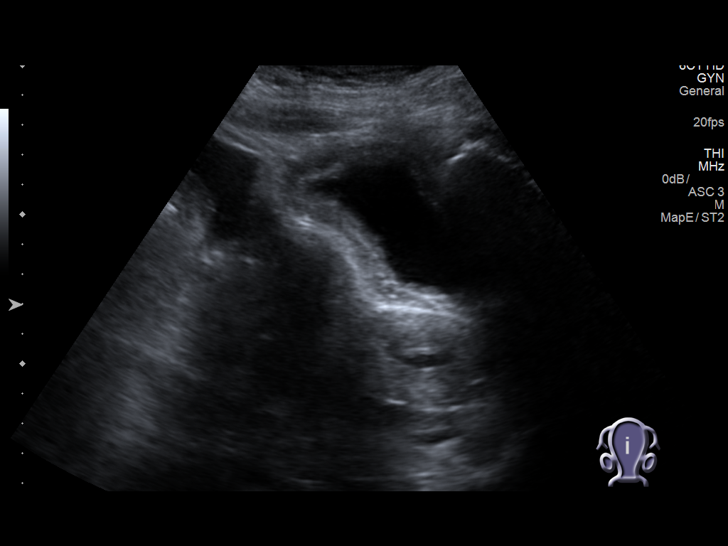
[im 13/149]
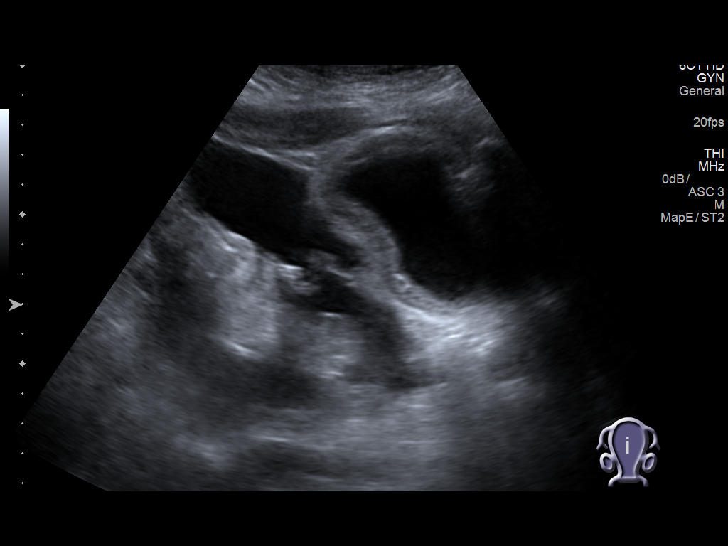
[im 25/149]
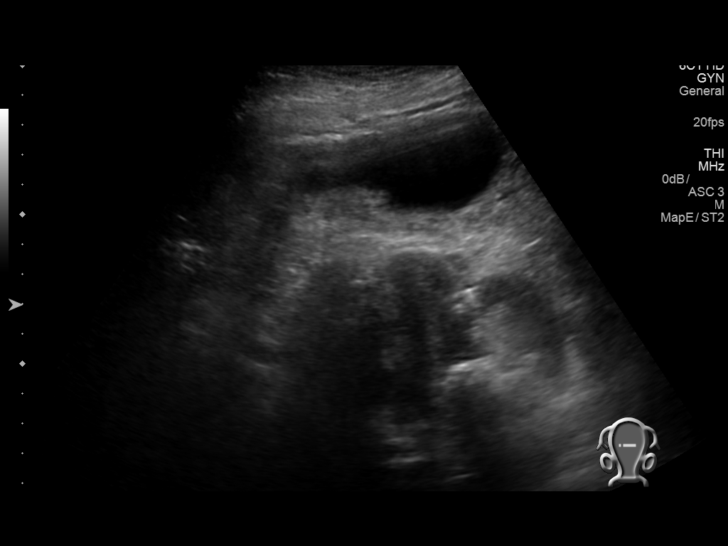
[im 38/149]
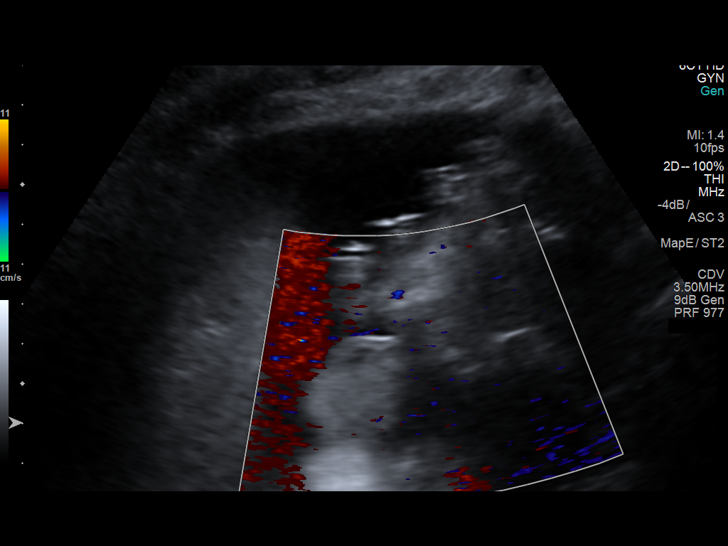
[im 50/149]
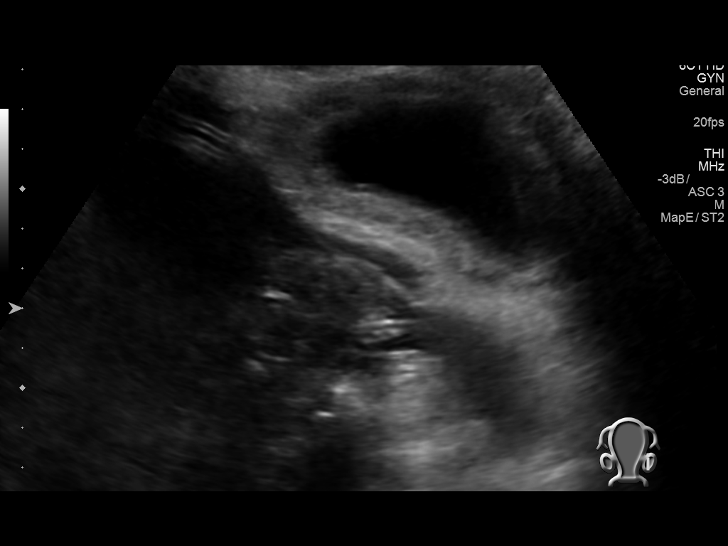
[im 62/149]
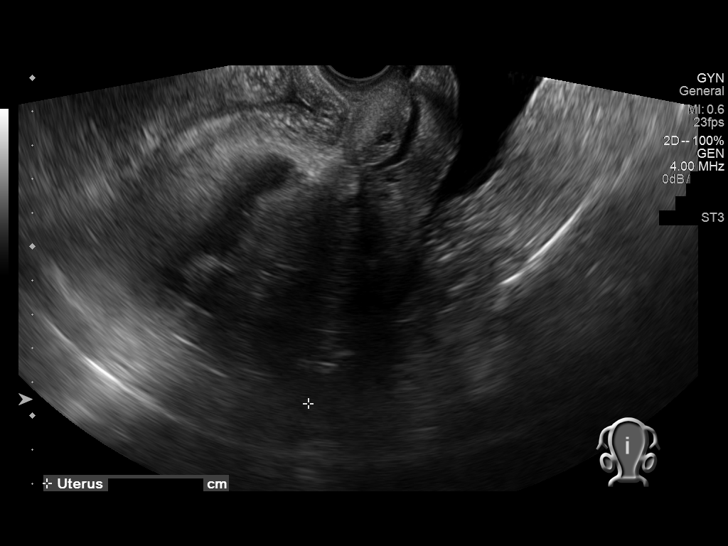
[im 75/149]
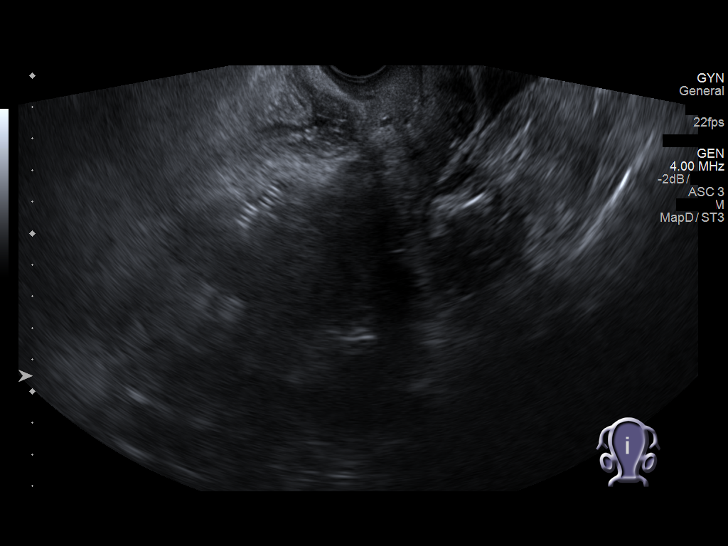
[im 87/149]
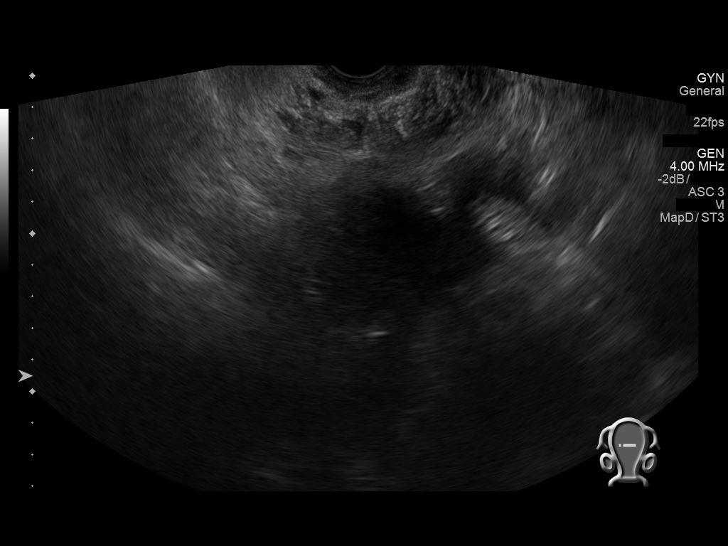
[im 99/149]
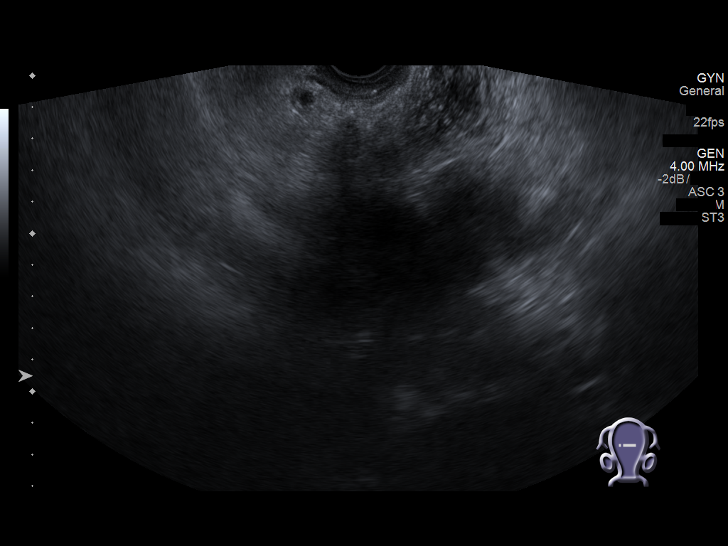
[im 112/149]
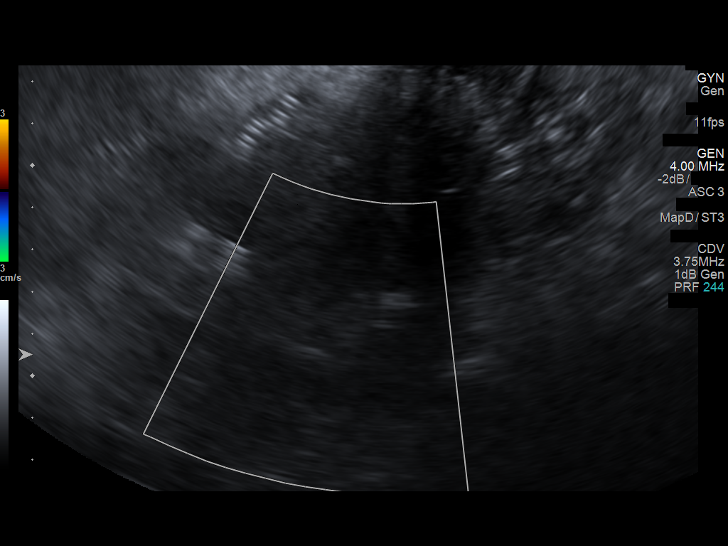
[im 124/149]
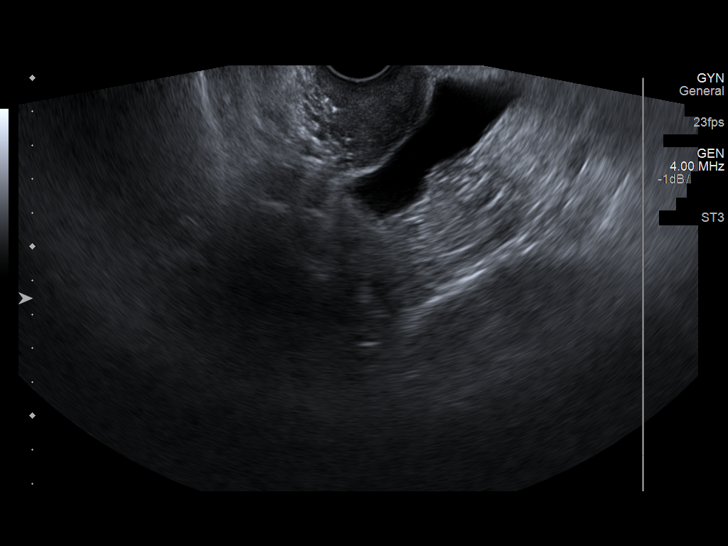
[im 136/149]
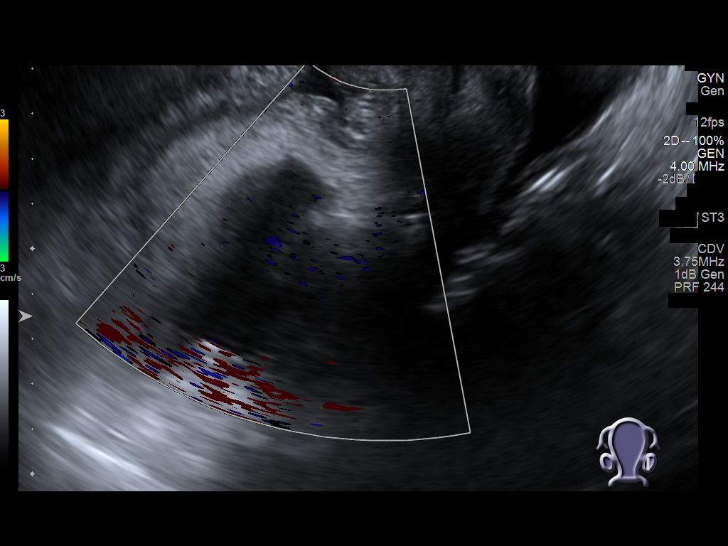
[im 149/149]
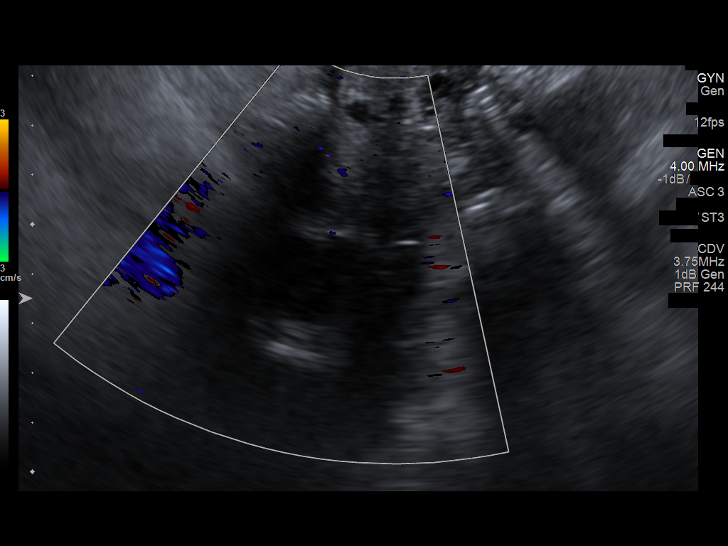

[13 of 25 positions shown; findings below may reference images not displayed]

FINDINGS: Uterus

Measurements: 11.2 x 5.4 x 5.4 cm = volume: 171 mL. Uterus is poorly
visualized on transabdominal imaging due to poor acoustic
window/decompressed bladder. Uterine fundus suboptimally visualized
on transvaginal imaging due to bowel and distance from vagina. No
gross evidence of mass on limited assessment.

Endometrium

Thickness: Questionably visualized at 17 mm thick. Poorly seen,
suboptimally assessed. Endocervical fluid noted.

Right ovary

Measurements: 3.3 x 13.7 x 4.0 cm = volume: 25.0 mL. Suboptimally
visualized due to bowel in adnexa. Probable dominant follicle
without mass

Left ovary

Measurements: 3.2 x 4.6 x 3.3 cm = volume: 24.7 mL. Normal
morphology without mass

Other findings

Free pelvic fluid question related to peritoneal dialysis. No gross
adnexal masses.
IMPRESSION: Suboptimal exam due to bowel in the adnexa, as well as poor acoustic
window limiting transabdominal imaging.

Questionably thickened endometrial complex 17 mm thick, poorly
visualized, recommendation below.

If bleeding remains unresponsive to hormonal or medical therapy,
focal lesion work-up with sonohysterogram should be considered.
Endometrial biopsy should also be considered in pre-menopausal
patients at high risk for endometrial carcinoma. (Ref: Radiological
Reasoning: Algorithmic Workup of Abnormal Vaginal Bleeding with
Endovaginal Sonography and Sonohysterography. AJR [UM]; 191:S68-73)

## 2018-12-11 DIAGNOSIS — D631 Anemia in chronic kidney disease: Secondary | ICD-10-CM | POA: Diagnosis not present

## 2018-12-11 DIAGNOSIS — N186 End stage renal disease: Secondary | ICD-10-CM | POA: Diagnosis not present

## 2018-12-11 DIAGNOSIS — D509 Iron deficiency anemia, unspecified: Secondary | ICD-10-CM | POA: Diagnosis not present

## 2018-12-11 DIAGNOSIS — Z992 Dependence on renal dialysis: Secondary | ICD-10-CM | POA: Diagnosis not present

## 2018-12-12 DIAGNOSIS — Z992 Dependence on renal dialysis: Secondary | ICD-10-CM | POA: Diagnosis not present

## 2018-12-12 DIAGNOSIS — D509 Iron deficiency anemia, unspecified: Secondary | ICD-10-CM | POA: Diagnosis not present

## 2018-12-12 DIAGNOSIS — N186 End stage renal disease: Secondary | ICD-10-CM | POA: Diagnosis not present

## 2018-12-12 DIAGNOSIS — D631 Anemia in chronic kidney disease: Secondary | ICD-10-CM | POA: Diagnosis not present

## 2018-12-13 DIAGNOSIS — N186 End stage renal disease: Secondary | ICD-10-CM | POA: Diagnosis not present

## 2018-12-13 DIAGNOSIS — D509 Iron deficiency anemia, unspecified: Secondary | ICD-10-CM | POA: Diagnosis not present

## 2018-12-13 DIAGNOSIS — D631 Anemia in chronic kidney disease: Secondary | ICD-10-CM | POA: Diagnosis not present

## 2018-12-13 DIAGNOSIS — Z992 Dependence on renal dialysis: Secondary | ICD-10-CM | POA: Diagnosis not present

## 2018-12-14 ENCOUNTER — Emergency Department (HOSPITAL_COMMUNITY)
Admission: EM | Admit: 2018-12-14 | Discharge: 2018-12-14 | Disposition: A | Discharge status: Home / Self Care | Payer: Medicare Other | Attending: Emergency Medicine | Admitting: Emergency Medicine

## 2018-12-14 ENCOUNTER — Encounter (HOSPITAL_COMMUNITY): Payer: Self-pay | Admitting: Emergency Medicine

## 2018-12-14 DIAGNOSIS — Z992 Dependence on renal dialysis: Secondary | ICD-10-CM | POA: Diagnosis not present

## 2018-12-14 DIAGNOSIS — Z5321 Procedure and treatment not carried out due to patient leaving prior to being seen by health care provider: Secondary | ICD-10-CM | POA: Diagnosis not present

## 2018-12-14 DIAGNOSIS — D631 Anemia in chronic kidney disease: Secondary | ICD-10-CM | POA: Diagnosis not present

## 2018-12-14 DIAGNOSIS — R109 Unspecified abdominal pain: Secondary | ICD-10-CM | POA: Insufficient documentation

## 2018-12-14 DIAGNOSIS — N186 End stage renal disease: Secondary | ICD-10-CM | POA: Diagnosis not present

## 2018-12-14 DIAGNOSIS — D509 Iron deficiency anemia, unspecified: Secondary | ICD-10-CM | POA: Diagnosis not present

## 2018-12-14 LAB — CBC
HCT: 34.3 % — ABNORMAL LOW (ref 36.0–46.0)
Hemoglobin: 10.6 g/dL — ABNORMAL LOW (ref 12.0–15.0)
MCH: 23.5 pg — ABNORMAL LOW (ref 26.0–34.0)
MCHC: 30.9 g/dL (ref 30.0–36.0)
MCV: 76.1 fL — ABNORMAL LOW (ref 80.0–100.0)
Platelets: 239 10*3/uL (ref 150–400)
RBC: 4.51 MIL/uL (ref 3.87–5.11)
RDW: 20.4 % — ABNORMAL HIGH (ref 11.5–15.5)
WBC: 8.2 10*3/uL (ref 4.0–10.5)
nRBC: 0 % (ref 0.0–0.2)

## 2018-12-14 LAB — COMPREHENSIVE METABOLIC PANEL
ALT: 29 U/L (ref 0–44)
AST: 14 U/L — ABNORMAL LOW (ref 15–41)
Albumin: 2.4 g/dL — ABNORMAL LOW (ref 3.5–5.0)
Alkaline Phosphatase: 137 U/L — ABNORMAL HIGH (ref 38–126)
Anion gap: 13 (ref 5–15)
BUN: 33 mg/dL — ABNORMAL HIGH (ref 6–20)
CO2: 28 mmol/L (ref 22–32)
Calcium: 8.6 mg/dL — ABNORMAL LOW (ref 8.9–10.3)
Chloride: 89 mmol/L — ABNORMAL LOW (ref 98–111)
Creatinine, Ser: 8.44 mg/dL — ABNORMAL HIGH (ref 0.44–1.00)
GFR calc Af Amer: 6 mL/min — ABNORMAL LOW (ref >60)
GFR calc non Af Amer: 5 mL/min — ABNORMAL LOW (ref >60)
Glucose, Bld: 205 mg/dL — ABNORMAL HIGH (ref 70–99)
Potassium: 2.8 mmol/L — ABNORMAL LOW (ref 3.5–5.1)
Sodium: 130 mmol/L — ABNORMAL LOW (ref 135–145)
Total Bilirubin: 0.6 mg/dL (ref 0.3–1.2)
Total Protein: 7.2 g/dL (ref 6.5–8.1)

## 2018-12-14 LAB — I-STAT BETA HCG BLOOD, ED (MC, WL, AP ONLY): I-stat hCG, quantitative: 8.8 m[IU]/mL — ABNORMAL HIGH (ref <5)

## 2018-12-14 LAB — LIPASE, BLOOD: Lipase: 24 U/L (ref 11–51)

## 2018-12-14 MED ORDER — SODIUM CHLORIDE 0.9% FLUSH: 3.0000 mL | Freq: Once | INTRAVENOUS | Status: DC

## 2018-12-14 NOTE — ED Triage Notes (Signed)
Pt. Stated, I started having lower stomahc pain that goes around with some diarrhea, nausea, and some dizziness when I try to stand.

## 2018-12-14 NOTE — ED Notes (Signed)
Dialysis patient, cannot produce urine.

## 2018-12-15 DIAGNOSIS — D509 Iron deficiency anemia, unspecified: Secondary | ICD-10-CM | POA: Diagnosis not present

## 2018-12-15 DIAGNOSIS — D631 Anemia in chronic kidney disease: Secondary | ICD-10-CM | POA: Diagnosis not present

## 2018-12-15 DIAGNOSIS — N186 End stage renal disease: Secondary | ICD-10-CM | POA: Diagnosis not present

## 2018-12-15 DIAGNOSIS — Z992 Dependence on renal dialysis: Secondary | ICD-10-CM | POA: Diagnosis not present

## 2018-12-16 DIAGNOSIS — D509 Iron deficiency anemia, unspecified: Secondary | ICD-10-CM | POA: Diagnosis not present

## 2018-12-16 DIAGNOSIS — Z992 Dependence on renal dialysis: Secondary | ICD-10-CM | POA: Diagnosis not present

## 2018-12-16 DIAGNOSIS — N186 End stage renal disease: Secondary | ICD-10-CM | POA: Diagnosis not present

## 2018-12-16 DIAGNOSIS — D631 Anemia in chronic kidney disease: Secondary | ICD-10-CM | POA: Diagnosis not present

## 2018-12-17 ENCOUNTER — Ambulatory Visit (INDEPENDENT_AMBULATORY_CARE_PROVIDER_SITE_OTHER): Payer: Medicare Other

## 2018-12-17 ENCOUNTER — Other Ambulatory Visit: Payer: Self-pay

## 2018-12-17 ENCOUNTER — Encounter: Payer: Self-pay | Admitting: Podiatry

## 2018-12-17 ENCOUNTER — Ambulatory Visit (INDEPENDENT_AMBULATORY_CARE_PROVIDER_SITE_OTHER): Payer: Medicare Other | Admitting: Podiatry

## 2018-12-17 DIAGNOSIS — L97411 Non-pressure chronic ulcer of right heel and midfoot limited to breakdown of skin: Secondary | ICD-10-CM | POA: Diagnosis not present

## 2018-12-17 DIAGNOSIS — E1140 Type 2 diabetes mellitus with diabetic neuropathy, unspecified: Secondary | ICD-10-CM | POA: Diagnosis not present

## 2018-12-17 DIAGNOSIS — D509 Iron deficiency anemia, unspecified: Secondary | ICD-10-CM | POA: Diagnosis not present

## 2018-12-17 DIAGNOSIS — E11621 Type 2 diabetes mellitus with foot ulcer: Secondary | ICD-10-CM | POA: Diagnosis not present

## 2018-12-17 DIAGNOSIS — D631 Anemia in chronic kidney disease: Secondary | ICD-10-CM | POA: Diagnosis not present

## 2018-12-17 DIAGNOSIS — N186 End stage renal disease: Secondary | ICD-10-CM | POA: Diagnosis not present

## 2018-12-17 DIAGNOSIS — B351 Tinea unguium: Secondary | ICD-10-CM | POA: Diagnosis not present

## 2018-12-17 DIAGNOSIS — E1149 Type 2 diabetes mellitus with other diabetic neurological complication: Secondary | ICD-10-CM

## 2018-12-17 DIAGNOSIS — Z992 Dependence on renal dialysis: Secondary | ICD-10-CM | POA: Diagnosis not present

## 2018-12-17 MED ORDER — MUPIROCIN 2 % EX OINT: TOPICAL_OINTMENT | CUTANEOUS | 0 refills | Status: AC

## 2018-12-17 NOTE — Patient Instructions (Addendum)
DRESSING CHANGES RIGHT FOOT:  WEAR SURGICAL SHOE AT ALL TIMES    1. KEEP RIGHT  FOOT DRY AT ALL TIMES!!!!  2. CLEANSE ULCER WITH SALINE.  3. DAB DRY WITH GAUZE SPONGE.  4. APPLY A LIGHT AMOUNT OF MUPIROCIN OINTMENT TO BASE OF ULCER.  5. APPLY OUTER DRESSING/BAND-AID AS INSTRUCTED.  6. WEAR SURGICAL SHOE DAILY AT ALL TIMES.  7. DO NOT WALK BAREFOOT!!!  8.  IF YOU EXPERIENCE ANY FEVER, CHILLS, NIGHTSWEATS, NAUSEA OR VOMITING, ELEVATED OR LOW BLOOD SUGARS, REPORT TO EMERGENCY ROOM.  9. IF YOU EXPERIENCE INCREASED REDNESS, PAIN, SWELLING, DISCOLORATION, ODOR, PUS, DRAINAGE OR WARMTH OF YOUR FOOT, REPORT TO EMERGENCY ROOM.   Diabetes Mellitus and Foot Care Foot care is an important part of your health, especially when you have diabetes. Diabetes may cause you to have problems because of poor blood flow (circulation) to your feet and legs, which can cause your skin to:  Become thinner and drier.  Break more easily.  Heal more slowly.  Peel and crack. You may also have nerve damage (neuropathy) in your legs and feet, causing decreased feeling in them. This means that you may not notice minor injuries to your feet that could lead to more serious problems. Noticing and addressing any potential problems early is the best way to prevent future foot problems. How to care for your feet Foot hygiene  Wash your feet daily with warm water and mild soap. Do not use hot water. Then, pat your feet and the areas between your toes until they are completely dry. Do not soak your feet as this can dry your skin.  Trim your toenails straight across. Do not dig under them or around the cuticle. File the edges of your nails with an emery board or nail file.  Apply a moisturizing lotion or petroleum jelly to the skin on your feet and to dry, brittle toenails. Use lotion that does not contain alcohol and is unscented. Do not apply lotion between your toes. Shoes and socks  Wear clean socks or  stockings every day. Make sure they are not too tight. Do not wear knee-high stockings since they may decrease blood flow to your legs.  Wear shoes that fit properly and have enough cushioning. Always look in your shoes before you put them on to be sure there are no objects inside.  To break in new shoes, wear them for just a few hours a day. This prevents injuries on your feet. Wounds, scrapes, corns, and calluses  Check your feet daily for blisters, cuts, bruises, sores, and redness. If you cannot see the bottom of your feet, use a mirror or ask someone for help.  Do not cut corns or calluses or try to remove them with medicine.  If you find a minor scrape, cut, or break in the skin on your feet, keep it and the skin around it clean and dry. You may clean these areas with mild soap and water. Do not clean the area with peroxide, alcohol, or iodine.  If you have a wound, scrape, corn, or callus on your foot, look at it several times a day to make sure it is healing and not infected. Check for: ? Redness, swelling, or pain. ? Fluid or blood. ? Warmth. ? Pus or a bad smell. General instructions  Do not cross your legs. This may decrease blood flow to your feet.  Do not use heating pads or hot water bottles on your feet. They may burn your  skin. If you have lost feeling in your feet or legs, you may not know this is happening until it is too late.  Protect your feet from hot and cold by wearing shoes, such as at the beach or on hot pavement.  Schedule a complete foot exam at least once a year (annually) or more often if you have foot problems. If you have foot problems, report any cuts, sores, or bruises to your health care provider immediately. Contact a health care provider if:  You have a medical condition that increases your risk of infection and you have any cuts, sores, or bruises on your feet.  You have an injury that is not healing.  You have redness on your legs or feet.   You feel burning or tingling in your legs or feet.  You have pain or cramps in your legs and feet.  Your legs or feet are numb.  Your feet always feel cold.  You have pain around a toenail. Get help right away if:  You have a wound, scrape, corn, or callus on your foot and: ? You have pain, swelling, or redness that gets worse. ? You have fluid or blood coming from the wound, scrape, corn, or callus. ? Your wound, scrape, corn, or callus feels warm to the touch. ? You have pus or a bad smell coming from the wound, scrape, corn, or callus. ? You have a fever. ? You have a red line going up your leg. Summary  Check your feet every day for cuts, sores, red spots, swelling, and blisters.  Moisturize feet and legs daily.  Wear shoes that fit properly and have enough cushioning.  If you have foot problems, report any cuts, sores, or bruises to your health care provider immediately.  Schedule a complete foot exam at least once a year (annually) or more often if you have foot problems. This information is not intended to replace advice given to you by your health care provider. Make sure you discuss any questions you have with your health care provider. Document Released: 03/11/2000 Document Revised: 04/26/2017 Document Reviewed: 04/15/2016 Elsevier Patient Education  [place]? An infection that lies within the keratin of your nail plate that is caused by a fungus.  WHY ME? Fungal infections affect all ages, sexes, races, and creeds.  There may be many factors that predispose you to a fungal infection such as age, coexisting medical conditions such as diabetes, or an autoimmune disease; stress, medications, fatigue, genetics, etc.  Bottom line: fungus thrives in a warm, moist environment and your shoes offer such a location.  IS IT CONTAGIOUS? Theoretically, yes.  You do not want to share shoes, nail clippers or files with someone  who has fungal toenails.  Walking around barefoot in the same room or sleeping in the same bed is unlikely to transfer the organism.  It is important to realize, however, that fungus can spread easily from one nail to the next on the same foot.  HOW DO WE TREAT THIS?  There are several ways to treat this condition.  Treatment may depend on many factors such as age, medications, pregnancy, liver and kidney conditions, etc.  It is best to ask your doctor which options are available to you.  1. No treatment.   Unlike many other medical concerns, you can live with this condition.  However for many people this can be a painful condition and may lead to ingrown  toenails or a bacterial infection.  It is recommended that you keep the nails cut short to help reduce the amount of fungal nail. 2. Topical treatment.  These range from herbal remedies to prescription strength nail lacquers.  About 40-50% effective, topicals require twice daily application for approximately 9 to 12 months or until an entirely new nail has grown out.  The most effective topicals are medical grade medications available through physicians offices. 3. Oral antifungal medications.  With an 80-90% cure rate, the most common oral medication requires 3 to 4 months of therapy and stays in your system for a year as the new nail grows out.  Oral antifungal medications do require blood work to make sure it is a safe drug for you.  A liver function panel will be performed prior to starting the medication and after the first month of treatment.  It is important to have the blood work performed to avoid any harmful side effects.  In general, this medication safe but blood work is required. 4. Laser Therapy.  This treatment is performed by applying a specialized laser to the affected nail plate.  This therapy is noninvasive, fast, and non-painful.  It is not covered by insurance and is therefore, out of pocket.  The results have been very good with a  80-95% cure rate.  [facility] is the only practice in the area to offer this therapy. 5. Permanent Nail Avulsion.  Removing the entire nail so that a new nail will not grow back.

## 2018-12-17 NOTE — Telephone Encounter (Signed)
Pt called with c/o nausea & vomiting when taking Bentyl. She is taking Bentyl but d/c a few days ago due nausea vomiting after she takes the medication. Pt is asking for another medication. Since pt d/c Bentyl, she continues to have lower abdominal pain after eating. Pt is eating smaller meals to see if that helps the diarrhea.Pt is aware that [facility] isn't in the office today.

## 2018-12-18 ENCOUNTER — Other Ambulatory Visit: Payer: Self-pay

## 2018-12-18 ENCOUNTER — Encounter (HOSPITAL_COMMUNITY): Payer: Self-pay | Admitting: Emergency Medicine

## 2018-12-18 ENCOUNTER — Inpatient Hospital Stay (HOSPITAL_COMMUNITY)
Admission: EM | Admit: 2018-12-18 | Discharge: 2018-12-20 | DRG: 371 | Disposition: A | Discharge status: Home / Self Care | Payer: Medicare Other | Attending: Internal Medicine | Admitting: Internal Medicine

## 2018-12-18 DIAGNOSIS — Z961 Presence of intraocular lens: Secondary | ICD-10-CM | POA: Diagnosis present

## 2018-12-18 DIAGNOSIS — Z794 Long term (current) use of insulin: Secondary | ICD-10-CM | POA: Diagnosis not present

## 2018-12-18 DIAGNOSIS — E871 Hypo-osmolality and hyponatremia: Secondary | ICD-10-CM | POA: Diagnosis present

## 2018-12-18 DIAGNOSIS — T8029XA Infection following other infusion, transfusion and therapeutic injection, initial encounter: Secondary | ICD-10-CM | POA: Diagnosis present

## 2018-12-18 DIAGNOSIS — Z89412 Acquired absence of left great toe: Secondary | ICD-10-CM | POA: Diagnosis not present

## 2018-12-18 DIAGNOSIS — Z6835 Body mass index (BMI) 35.0-35.9, adult: Secondary | ICD-10-CM

## 2018-12-18 DIAGNOSIS — N250 Renal osteodystrophy: Secondary | ICD-10-CM | POA: Diagnosis not present

## 2018-12-18 DIAGNOSIS — E876 Hypokalemia: Secondary | ICD-10-CM | POA: Diagnosis present

## 2018-12-18 DIAGNOSIS — I509 Heart failure, unspecified: Secondary | ICD-10-CM | POA: Clinically undetermined

## 2018-12-18 DIAGNOSIS — R197 Diarrhea, unspecified: Secondary | ICD-10-CM | POA: Diagnosis present

## 2018-12-18 DIAGNOSIS — N186 End stage renal disease: Secondary | ICD-10-CM | POA: Diagnosis present

## 2018-12-18 DIAGNOSIS — E1122 Type 2 diabetes mellitus with diabetic chronic kidney disease: Secondary | ICD-10-CM | POA: Diagnosis not present

## 2018-12-18 DIAGNOSIS — Z86010 Personal history of colonic polyps: Secondary | ICD-10-CM | POA: Diagnosis not present

## 2018-12-18 DIAGNOSIS — Z9841 Cataract extraction status, right eye: Secondary | ICD-10-CM | POA: Diagnosis not present

## 2018-12-18 DIAGNOSIS — K652 Spontaneous bacterial peritonitis: Principal | ICD-10-CM | POA: Clinically undetermined

## 2018-12-18 DIAGNOSIS — Z992 Dependence on renal dialysis: Secondary | ICD-10-CM

## 2018-12-18 DIAGNOSIS — Y841 Kidney dialysis as the cause of abnormal reaction of the patient, or of later complication, without mention of misadventure at the time of the procedure: Secondary | ICD-10-CM | POA: Diagnosis not present

## 2018-12-18 DIAGNOSIS — Z96642 Presence of left artificial hip joint: Secondary | ICD-10-CM | POA: Diagnosis present

## 2018-12-18 DIAGNOSIS — R1084 Generalized abdominal pain: Secondary | ICD-10-CM

## 2018-12-18 DIAGNOSIS — I132 Hypertensive heart and chronic kidney disease with heart failure and with stage 5 chronic kidney disease, or end stage renal disease: Secondary | ICD-10-CM | POA: Diagnosis present

## 2018-12-18 DIAGNOSIS — I10 Essential (primary) hypertension: Secondary | ICD-10-CM | POA: Diagnosis not present

## 2018-12-18 DIAGNOSIS — Z7722 Contact with and (suspected) exposure to environmental tobacco smoke (acute) (chronic): Secondary | ICD-10-CM | POA: Diagnosis present

## 2018-12-18 DIAGNOSIS — Z79899 Other long term (current) drug therapy: Secondary | ICD-10-CM

## 2018-12-18 DIAGNOSIS — Z20828 Contact with and (suspected) exposure to other viral communicable diseases: Secondary | ICD-10-CM | POA: Diagnosis present

## 2018-12-18 DIAGNOSIS — E669 Obesity, unspecified: Secondary | ICD-10-CM | POA: Diagnosis present

## 2018-12-18 DIAGNOSIS — I120 Hypertensive chronic kidney disease with stage 5 chronic kidney disease or end stage renal disease: Secondary | ICD-10-CM | POA: Diagnosis not present

## 2018-12-18 DIAGNOSIS — T8571XA Infection and inflammatory reaction due to peritoneal dialysis catheter, initial encounter: Secondary | ICD-10-CM | POA: Diagnosis not present

## 2018-12-18 DIAGNOSIS — D631 Anemia in chronic kidney disease: Secondary | ICD-10-CM | POA: Diagnosis present

## 2018-12-18 DIAGNOSIS — Z8249 Family history of ischemic heart disease and other diseases of the circulatory system: Secondary | ICD-10-CM | POA: Diagnosis not present

## 2018-12-18 LAB — COMPREHENSIVE METABOLIC PANEL
ALT: 17 U/L (ref 0–44)
ALT: 14 U/L (ref 0–44)
AST: 13 U/L — ABNORMAL LOW (ref 15–41)
AST: 14 U/L — ABNORMAL LOW (ref 15–41)
Albumin: 2.6 g/dL — ABNORMAL LOW (ref 3.5–5.0)
Albumin: 2.3 g/dL — ABNORMAL LOW (ref 3.5–5.0)
Alkaline Phosphatase: 112 U/L (ref 38–126)
Alkaline Phosphatase: 106 U/L (ref 38–126)
Anion gap: 16 — ABNORMAL HIGH (ref 5–15)
Anion gap: 13 (ref 5–15)
BUN: 40 mg/dL — ABNORMAL HIGH (ref 6–20)
BUN: 41 mg/dL — ABNORMAL HIGH (ref 6–20)
CO2: 27 mmol/L (ref 22–32)
CO2: 28 mmol/L (ref 22–32)
Calcium: 8.4 mg/dL — ABNORMAL LOW (ref 8.9–10.3)
Calcium: 8.1 mg/dL — ABNORMAL LOW (ref 8.9–10.3)
Chloride: 87 mmol/L — ABNORMAL LOW (ref 98–111)
Chloride: 90 mmol/L — ABNORMAL LOW (ref 98–111)
Creatinine, Ser: 8.15 mg/dL — ABNORMAL HIGH (ref 0.44–1.00)
Creatinine, Ser: 8.67 mg/dL — ABNORMAL HIGH (ref 0.44–1.00)
GFR calc Af Amer: 6 mL/min — ABNORMAL LOW (ref >60)
GFR calc Af Amer: 6 mL/min — ABNORMAL LOW (ref >60)
GFR calc non Af Amer: 5 mL/min — ABNORMAL LOW (ref >60)
GFR calc non Af Amer: 5 mL/min — ABNORMAL LOW (ref >60)
Glucose, Bld: 188 mg/dL — ABNORMAL HIGH (ref 70–99)
Glucose, Bld: 184 mg/dL — ABNORMAL HIGH (ref 70–99)
Potassium: 2.7 mmol/L — CL (ref 3.5–5.1)
Potassium: 3.9 mmol/L (ref 3.5–5.1)
Sodium: 130 mmol/L — ABNORMAL LOW (ref 135–145)
Sodium: 131 mmol/L — ABNORMAL LOW (ref 135–145)
Total Bilirubin: 0.5 mg/dL (ref 0.3–1.2)
Total Bilirubin: 0.7 mg/dL (ref 0.3–1.2)
Total Protein: 8.1 g/dL (ref 6.5–8.1)
Total Protein: 7.1 g/dL (ref 6.5–8.1)

## 2018-12-18 LAB — BODY FLUID CELL COUNT WITH DIFFERENTIAL
Lymphs, Fluid: 7 %
Monocyte-Macrophage-Serous Fluid: 24 % — ABNORMAL LOW (ref 50–90)
Neutrophil Count, Fluid: 69 % — ABNORMAL HIGH (ref 0–25)
Total Nucleated Cell Count, Fluid: 12267 cu mm — ABNORMAL HIGH (ref 0–1000)

## 2018-12-18 LAB — ALBUMIN, PLEURAL OR PERITONEAL FLUID: Albumin, Fluid: <1 g/dL

## 2018-12-18 LAB — SARS CORONAVIRUS 2 (TAT 6-24 HRS): SARS Coronavirus 2: NEGATIVE

## 2018-12-18 LAB — LIPASE, BLOOD: Lipase: 19 U/L (ref 11–51)

## 2018-12-18 LAB — CBC
HCT: 37.8 % (ref 36.0–46.0)
Hemoglobin: 11.3 g/dL — ABNORMAL LOW (ref 12.0–15.0)
MCH: 22.4 pg — ABNORMAL LOW (ref 26.0–34.0)
MCHC: 29.9 g/dL — ABNORMAL LOW (ref 30.0–36.0)
MCV: 75 fL — ABNORMAL LOW (ref 80.0–100.0)
Platelets: 250 10*3/uL (ref 150–400)
RBC: 5.04 MIL/uL (ref 3.87–5.11)
RDW: 19.9 % — ABNORMAL HIGH (ref 11.5–15.5)
WBC: 13.3 10*3/uL — ABNORMAL HIGH (ref 4.0–10.5)
nRBC: 0 % (ref 0.0–0.2)

## 2018-12-18 LAB — GLUCOSE, PLEURAL OR PERITONEAL FLUID: Glucose, Fluid: 182 mg/dL

## 2018-12-18 LAB — LACTATE DEHYDROGENASE, PLEURAL OR PERITONEAL FLUID: LD, Fluid: 42 U/L — ABNORMAL HIGH (ref 3–23)

## 2018-12-18 LAB — PROTEIN, PLEURAL OR PERITONEAL FLUID: Total protein, fluid: <3 g/dL

## 2018-12-18 LAB — CBG MONITORING, ED
Glucose-Capillary: 182 mg/dL — ABNORMAL HIGH (ref 70–99)
Glucose-Capillary: 173 mg/dL — ABNORMAL HIGH (ref 70–99)
Glucose-Capillary: 130 mg/dL — ABNORMAL HIGH (ref 70–99)
Glucose-Capillary: 160 mg/dL — ABNORMAL HIGH (ref 70–99)

## 2018-12-18 MED ORDER — MAGNESIUM SULFATE 2 GM/50ML IV SOLN
2.0000 g | Freq: Once | INTRAVENOUS | Status: AC
  Administered 2018-12-18: 2 g via INTRAVENOUS
  Filled 2018-12-18: qty 50

## 2018-12-18 MED ORDER — ALPRAZOLAM 0.5 MG PO TABS: 0.5000 mg | ORAL_TABLET | Freq: Every evening | ORAL | Status: DC | PRN

## 2018-12-18 MED ORDER — LABETALOL HCL 200 MG PO TABS
200.0000 mg | ORAL_TABLET | Freq: Two times a day (BID) | ORAL | Status: DC
  Administered 2018-12-18 – 2018-12-20 (×5): 200 mg via ORAL
  Filled 2018-12-18 (×5): qty 1

## 2018-12-18 MED ORDER — DOXAZOSIN MESYLATE 2 MG PO TABS
8.0000 mg | ORAL_TABLET | Freq: Every day | ORAL | Status: DC
  Administered 2018-12-18 – 2018-12-19 (×2): 8 mg via ORAL
  Filled 2018-12-18: qty 1
  Filled 2018-12-18: qty 4

## 2018-12-18 MED ORDER — INSULIN GLARGINE 100 UNIT/ML ~~LOC~~ SOLN
14.0000 [IU] | Freq: Every day | SUBCUTANEOUS | Status: DC
  Administered 2018-12-18 – 2018-12-19 (×2): 14 [IU] via SUBCUTANEOUS
  Filled 2018-12-18 (×3): qty 0.14

## 2018-12-18 MED ORDER — AMLODIPINE BESYLATE 10 MG PO TABS: 10.0000 mg | ORAL_TABLET | Freq: Every day | ORAL | Status: DC

## 2018-12-18 MED ORDER — SODIUM CHLORIDE 0.9 % IV SOLN
2.0000 g | Freq: Once | INTRAVENOUS | Status: AC
  Administered 2018-12-18: 2 g via INTRAVENOUS
  Filled 2018-12-18: qty 2

## 2018-12-18 MED ORDER — INSULIN ASPART 100 UNIT/ML ~~LOC~~ SOLN
0.0000 [IU] | Freq: Four times a day (QID) | SUBCUTANEOUS | Status: DC
  Administered 2018-12-18: 1 [IU] via SUBCUTANEOUS
  Administered 2018-12-18 (×2): 2 [IU] via SUBCUTANEOUS
  Administered 2018-12-19: 3 [IU] via SUBCUTANEOUS
  Administered 2018-12-19 (×2): 2 [IU] via SUBCUTANEOUS
  Administered 2018-12-20: 3 [IU] via SUBCUTANEOUS
  Administered 2018-12-20 (×2): 2 [IU] via SUBCUTANEOUS
  Filled 2018-12-18 (×3): qty 1

## 2018-12-18 MED ORDER — CALCITRIOL 0.5 MCG PO CAPS
0.5000 ug | ORAL_CAPSULE | ORAL | Status: DC
  Filled 2018-12-18 (×2): qty 2

## 2018-12-18 MED ORDER — SODIUM CHLORIDE 0.9 % IV SOLN
INTRAVENOUS | Status: DC
  Administered 2018-12-18 – 2018-12-19 (×2): via INTRAVENOUS

## 2018-12-18 MED ORDER — CALCITRIOL 0.5 MCG PO CAPS
0.5000 ug | ORAL_CAPSULE | ORAL | Status: DC
  Administered 2018-12-18 – 2018-12-20 (×3): 0.5 ug via ORAL
  Filled 2018-12-18 (×4): qty 1

## 2018-12-18 MED ORDER — POTASSIUM CHLORIDE 10 MEQ/100ML IV SOLN
10.0000 meq | INTRAVENOUS | Status: AC
  Administered 2018-12-18 (×2): 10 meq via INTRAVENOUS
  Filled 2018-12-18 (×2): qty 100

## 2018-12-18 MED ORDER — ACETAMINOPHEN 650 MG RE SUPP: 650.0000 mg | Freq: Four times a day (QID) | RECTAL | Status: DC | PRN

## 2018-12-18 MED ORDER — VANCOMYCIN HCL IN DEXTROSE 1-5 GM/200ML-% IV SOLN
1000.0000 mg | Freq: Once | INTRAVENOUS | Status: AC
  Administered 2018-12-18: 1000 mg via INTRAVENOUS
  Filled 2018-12-18: qty 200

## 2018-12-18 MED ORDER — SODIUM CHLORIDE 0.9 % IV SOLN
1.0000 g | INTRAVENOUS | Status: DC
  Administered 2018-12-19: 1 g via INTRAVENOUS
  Filled 2018-12-18 (×2): qty 1

## 2018-12-18 MED ORDER — ONDANSETRON HCL 4 MG PO TABS: 4.0000 mg | ORAL_TABLET | Freq: Four times a day (QID) | ORAL | Status: DC | PRN

## 2018-12-18 MED ORDER — ACETAMINOPHEN 325 MG PO TABS
650.0000 mg | ORAL_TABLET | Freq: Four times a day (QID) | ORAL | Status: DC | PRN
  Administered 2018-12-18 – 2018-12-20 (×2): 650 mg via ORAL
  Filled 2018-12-18 (×2): qty 2

## 2018-12-18 MED ORDER — MORPHINE SULFATE (PF) 4 MG/ML IV SOLN
4.0000 mg | Freq: Once | INTRAVENOUS | Status: AC
  Administered 2018-12-18: 4 mg via INTRAVENOUS
  Filled 2018-12-18: qty 1

## 2018-12-18 MED ORDER — CALCITRIOL 0.5 MCG PO CAPS: 1.0000 ug | ORAL_CAPSULE | ORAL | Status: DC

## 2018-12-18 MED ORDER — HEPARIN SODIUM (PORCINE) 5000 UNIT/ML IJ SOLN
5000.0000 [IU] | Freq: Three times a day (TID) | INTRAMUSCULAR | Status: DC
  Administered 2018-12-18 – 2018-12-20 (×8): 5000 [IU] via SUBCUTANEOUS
  Filled 2018-12-18 (×7): qty 1

## 2018-12-18 MED ORDER — FAMOTIDINE 20 MG PO TABS
20.0000 mg | ORAL_TABLET | Freq: Two times a day (BID) | ORAL | Status: DC | PRN
  Administered 2018-12-20: 20 mg via ORAL
  Filled 2018-12-18: qty 1

## 2018-12-18 MED ORDER — VANCOMYCIN HCL 500 MG IV SOLR
500.0000 mg | Freq: Once | INTRAVENOUS | Status: AC
  Administered 2018-12-18: 500 mg via INTRAVENOUS
  Filled 2018-12-18: qty 500

## 2018-12-18 MED ORDER — ONDANSETRON HCL 4 MG/2ML IJ SOLN
4.0000 mg | Freq: Once | INTRAMUSCULAR | Status: AC
  Administered 2018-12-18: 4 mg via INTRAVENOUS
  Filled 2018-12-18: qty 2

## 2018-12-18 MED ORDER — ONDANSETRON HCL 4 MG/2ML IJ SOLN
4.0000 mg | Freq: Four times a day (QID) | INTRAMUSCULAR | Status: DC | PRN
  Administered 2018-12-20: 4 mg via INTRAVENOUS
  Filled 2018-12-18: qty 2

## 2018-12-18 NOTE — Telephone Encounter (Signed)
Could we reach out to nephrology to see if Levsin would be ok in the setting of end stage renal disease on hemodialysis? We are trying to help her with abdominal pain and diarrhea. We have tried colestipol and bentyl thus far and patient has been intolerant to these.

## 2018-12-18 NOTE — ED Notes (Signed)
Report to David with [facility]   

## 2018-12-18 NOTE — Progress Notes (Signed)
Patient seen and evaluated at bedside.  Agree with H&P as dictated by Dr. Darrick Meigs.  Gram stain has demonstrated growth of gram-positive cocci thus far.  We will continue vancomycin and cefepime as ordered.  Discussed case with nephrology Dr. Joelyn Oms about seen patient as it does not appear transfer may take place today.  He states that he will see patient in a.m. if still here and will try to expedite transfer if possible.  Will repeat a.m. labs and monitor.  Total care time: 20 minutes.

## 2018-12-18 NOTE — Progress Notes (Addendum)
Pharmacy Antibiotic Note  Janet Mitchell is a 53 y.o. female admitted on 12/18/2018 with sepsis.  Pharmacy has been consulted for cefepime and vancomycin dosing. Janet Mitchell is on peritoneal dialysis.   Plan: Vancomycin 2.5gm iv x 1 loading dose. In 3-5 days when level drops < 15 mcg/ml, pharmacist following consult will need to redose. Goal trough 15-20 mcg/mL. cefepime 1gm iv q24h  Vancomycin trough ordered for 9/25am   Height: 5\' 8"  (172.7 cm) Weight: 230 lb (104.3 kg) IBW/kg (Calculated) : 63.9  Temp (24hrs), Avg:98.4 F (36.9 C), Min:98.4 F (36.9 C), Max:98.4 F (36.9 C)  Recent Labs  Lab 12/14/18 1630 12/18/18 0037 12/18/18 0352  WBC 8.2 13.3*  --   CREATININE 8.44* 8.15* 8.67*    Estimated Creatinine Clearance: 9.7 mL/min (A) (by C-G formula based on SCr of 8.67 mg/dL (H)).    No Known Allergies  Antimicrobials this admission: 9/22 vancomycin >>  9/22 cefepime >>   Dose adjustments this admission: N/A  Microbiology results: 9/22 BCx: sent 9/22 Peritoneal Cavity Culture: Sent 9/22 Covid 19: sent   Thank you for allowing pharmacy to be a part of this patient's care.  Donna Christen Guy Seese 12/18/2018 10:55 AM

## 2018-12-18 NOTE — ED Notes (Signed)
CRITICAL VALUE ALERT  Critical Value:  K+ 2.7  Date & Time Notied:  12/18/2018 0132  Provider Notified: Dr. Dina Rich  Orders Received/Actions taken: see chart

## 2018-12-18 NOTE — ED Triage Notes (Signed)
Pt C/O abdominal pain that has been ongoing since Wednesday. Pt states the pain and vomiting has been worse tonight.

## 2018-12-18 NOTE — H&P (Addendum)
TRH H&P    Patient Demographics:    Janet Mitchell, is a 53 y.o. female  MRN: [id number]  DOB - 1965-05-11  Admit Date - 12/18/2018  Referring MD/NP/PA: Dr. Dina Rich  Outpatient Primary MD for the patient is Lucianne Lei, MD  Patient coming from: Home  Chief complaint-nausea vomiting abdominal pain   HPI:    Kirsta Probert Mitchell  is a 53 y.o. female, with a history of ESRD on peritoneal dialysis nightly, hypertension, type 2 diabetes mellitus came to ED with complaints of abdominal pain for past 1 week.  Also having nausea, vomiting and diarrhea.  Patient says that she would move her bowels every time she eats.  And also complained of crampy abdominal pain.  Patient is on peritoneal dialysis and dialyzed last night.  She did note that dialysate was cloudy in appearance.  She denies any fever or chills.  Denies chest pain or shortness of breath.  Denies coughing up any phlegm. In the ED patient was found to have low potassium.  A sample of peritoneal fluid was obtained, which showed 69% neutrophils however there was no absolute white cell count.  Due to concern for SBP she was empirically started on vancomycin and cefepime.      Review of systems:    In addition to the HPI above,    All other systems reviewed and are negative.    Past History of the following :    Past Medical History:  Diagnosis Date  . Anemia   . CHF (congestive heart failure) ([place])   . Chronic cholecystitis with calculus   . ESRD (end stage renal disease) on dialysis [facility])    "peritoneal dialysis q hs" (03/09/2018)  . Headache    "a few/wk" (03/09/2018)  . History of blood transfusion 10/2017   "low blood count" (03/09/2018)  . Hypertension   . Spinal headache   . Type II diabetes mellitus ([place])       Past Surgical History:  Procedure Laterality Date  . AMPUTATION TOE Left 2013   Great toe  . BIOPSY  10/24/2018   Procedure: BIOPSY;  Surgeon: Daneil Dolin, MD;  [facility];  Service: Endoscopy;;  right and left colon  . CATARACT EXTRACTION W/ INTRAOCULAR LENS IMPLANT Right   . CESAREAN SECTION  1994; 1998  . CHOLECYSTECTOMY N/A 03/09/2018   Procedure: LAPAROSCOPIC CHOLECYSTECTOMY WITH INTRAOPERATIVE CHOLANGIOGRAM ERAS PATHWAY;  Surgeon: Donnie Mesa, MD;  Location: [place];  Service: General;  Laterality: N/A;  . COLONOSCOPY N/A 10/24/2018   Procedure: COLONOSCOPY;  Surgeon: Daneil Dolin, MD;  [facility];  Service: Endoscopy;  Laterality: N/A;  1:00pm  . EYE SURGERY  05/15/2018   Removal of blood in the globe (due to DM)  . FLEXIBLE SIGMOIDOSCOPY N/A 11/24/2017   Procedure: FLEXIBLE SIGMOIDOSCOPY;  Surgeon: Daneil Dolin, MD;  [facility];  Service: Endoscopy;  Laterality: N/A;  . LAPAROSCOPIC CHOLECYSTECTOMY  03/09/2018  . PERITONEAL CATHETER INSERTION  2017  . POLYPECTOMY  10/24/2018   Procedure: POLYPECTOMY;  Surgeon: Daneil Dolin, MD;  [facility];  Service: Endoscopy;;  . TOTAL HIP ARTHROPLASTY Left 1997      Social History:      Social History   Tobacco Use  . Smoking status: Passive Smoke Exposure - Never Smoker  . Smokeless tobacco: Never Used  Substance Use Topics  . Alcohol use: Never    Frequency: Never       Family History :     Family History  Problem Relation Age of Onset  . Heart disease Mother   . Thrombocytopenia Mother        TTP  . Heart failure Father   . Kidney disease Paternal Grandfather   . Colon cancer Neg Hx       Home Medications:   Prior to Admission medications   Medication Sig Start Date End Date Taking? Authorizing Provider  ALPRAZolam Duanne Moron) 0.5 MG tablet Take 0.5 mg by mouth at bedtime as needed for anxiety.    [provider]  amLODipine (NORVASC) 10 MG tablet Take 1 tablet (10 mg total) by mouth daily. 11/09/17   Roxan Hockey, MD  calcitRIOL (ROCALTROL) 0.5 MCG capsule  Take 0.5-1 mcg by mouth See admin instructions. 0.69mcg on Mondays through Fridays and take 80mcg on Saturdays and Sundays    [provider]  Crisaborole (EUCRISA) 2 % OINT Apply 1 application topically daily as needed (for rash/irritation).    [provider]  doxazosin (CARDURA) 8 MG tablet Take 8 mg by mouth at bedtime.    [provider]  Insulin Glargine (TOUJEO MAX SOLOSTAR) 300 UNIT/ML SOPN Inject 14 Units into the skin at bedtime. 11/09/17   Roxan Hockey, MD  labetalol (NORMODYNE) 200 MG tablet Take 200 mg by mouth 2 (two) times daily.    [provider]  mupirocin ointment (BACTROBAN) 2 % Apply to right foot ulcer once daily and cover with dressing. 12/17/18 12/31/18  Marzetta Board, DPM     Allergies:    No Known Allergies   Physical Exam:   Vitals  Blood pressure 130/64, pulse 75, temperature 98.4 F (36.9 C), temperature source Oral, resp. rate 17, height 5\' 8"  (1.727 m), weight 104.3 kg, SpO2 100 %.  1.  General: Appears in no acute distress  2. Psychiatric: Alert, oriented x3, intact insight and judgment  3. Neurologic: Cranial nerves II through XII grossly intact, no focal deficit  4. HEENMT:  Atraumatic normocephalic, extraocular muscles are intact  5. Respiratory : Clear to auscultation bilaterally  6. Cardiovascular : S1-S2, regular, no murmur auscultated  7. Gastrointestinal:  Abdomen soft, generalized tenderness palpation, worse in left lower quadrant, no rigidity or guarding     Data Review:    CBC Recent Labs  Lab 12/14/18 1630 12/18/18 0037  WBC 8.2 13.3*  HGB 10.6* 11.3*  HCT 34.3* 37.8  PLT 239 250  MCV 76.1* 75.0*  MCH 23.5* 22.4*  MCHC 30.9 29.9*  RDW 20.4* 19.9*   ------------------------------------------------------------------------------------------------------------------  Results for orders placed or performed during the hospital encounter of 12/18/18 (from the past 48 hour(s))  CBC      Status: Abnormal   Collection Time: 12/18/18 12:37 AM  Result Value Ref Range   WBC 13.3 (H) 4.0 - 10.5 K/uL   RBC 5.04 3.87 - 5.11 MIL/uL   Hemoglobin 11.3 (L) 12.0 - 15.0 g/dL   HCT 37.8 36.0 - 46.0 %   MCV 75.0 (L) 80.0 - 100.0 fL   MCH 22.4 (L) 26.0 -  34.0 pg   MCHC 29.9 (L) 30.0 - 36.0 g/dL   RDW 19.9 (H) 11.5 - 15.5 %   Platelets 250 150 - 400 K/uL   nRBC 0.0 0.0 - 0.2 %    Comment: Performed at [facility], [address], [place], [place] [address]  Comprehensive metabolic panel     Status: Abnormal   Collection Time: 12/18/18 12:37 AM  Result Value Ref Range   Sodium 130 (L) 135 - 145 mmol/L   Potassium 2.7 (LL) 3.5 - 5.1 mmol/L    Comment: CRITICAL RESULT CALLED TO, READ BACK BY AND VERIFIED WITH: M DOSS,RN @0132  12/18/18 MKELLY    Chloride 87 (L) 98 - 111 mmol/L   CO2 27 22 - 32 mmol/L   Glucose, Bld 188 (H) 70 - 99 mg/dL   BUN 40 (H) 6 - 20 mg/dL   Creatinine, Ser 8.15 (H) 0.44 - 1.00 mg/dL   Calcium 8.4 (L) 8.9 - 10.3 mg/dL   Total Protein 8.1 6.5 - 8.1 g/dL   Albumin 2.6 (L) 3.5 - 5.0 g/dL   AST 13 (L) 15 - 41 U/L   ALT 17 0 - 44 U/L   Alkaline Phosphatase 112 38 - 126 U/L   Total Bilirubin 0.5 0.3 - 1.2 mg/dL   GFR calc non Af Amer 5 (L) >60 mL/min   GFR calc Af Amer 6 (L) >60 mL/min   Anion gap 16 (H) 5 - 15    Comment: Performed at [facility], [address] [place], [place] [address]  Lipase, blood     Status: None   Collection Time: 12/18/18 12:37 AM  Result Value Ref Range   Lipase 19 11 - 51 U/L    Comment: Performed at [facility], [address] [place], [place] [address]  Lactate dehydrogenase (pleural or peritoneal fluid)     Status: Abnormal   Collection Time: 12/18/18  1:14 AM  Result Value Ref Range   LD, Fluid 42 (H) 3 - 23 U/L    Comment: (NOTE) Results should be evaluated in conjunction with serum values    Fluid Type-FLDH PERITONEAL CAVITY     Comment: Performed at [facility], [address], [place], [place] [address]   Glucose, pleural or peritoneal fluid     Status: None   Collection Time: 12/18/18  1:14 AM  Result Value Ref Range   Glucose, Fluid 182 mg/dL    Comment: (NOTE) No normal range established for this test Results should be evaluated in conjunction with serum values    Fluid Type-FGLU PERITONEAL CAVITY     Comment: Performed at [facility], [address] [place], [place] [address]  Protein, pleural or peritoneal fluid     Status: None   Collection Time: 12/18/18  1:14 AM  Result Value Ref Range   Total protein, fluid <3.0 g/dL   Fluid Type-FTP PERITONEAL CAVITY     Comment: Performed at [facility], [address], [place], [place] [address]  Albumin, pleural or peritoneal fluid     Status: None   Collection Time: 12/18/18  1:14 AM  Result Value Ref Range   Albumin, Fluid <1.0 g/dL   Fluid Type-FALB PERITONEAL CAVITY     Comment: Performed at [facility], [address] [place],  [address]  Body fluid cell count with differential     Status: Abnormal   Collection Time: 12/18/18  1:14 AM  Result Value Ref Range   Fluid Type-FCT Peritoneal    Color, Fluid  YELLOW (A) YELLOW   Appearance, Fluid CLOUDY (A) CLEAR   Neutrophil Count, Fluid 69 (H) 0 - 25 %   Lymphs, Fluid 7 %   Monocyte-Macrophage-Serous Fluid 24 (L) 50 - 90 %    Comment: Performed at [facility], [address] [place], [place] [address]  Culture, body fluid-bottle     Status: None (Preliminary result)   Collection Time: 12/18/18  1:14 AM   Specimen: Peritoneal Cavity  Result Value Ref Range   Specimen Description PERITONEAL CAVITY    Special Requests NONE    Gram Stain      CYTOSPIN SMEAR WBC PRESENT,BOTH PMN AND MONONUCLEAR NO ORGANISMS SEEN Performed at [facility], [address], [place], [place] [address]    Culture PENDING    Report Status PENDING     Chemistries  Recent Labs  Lab 12/14/18 1630 12/18/18 0037  NA 130* 130*  K 2.8* 2.7*  CL 89* 87*  CO2 28 27  GLUCOSE 205* 188*  BUN  33* 40*  CREATININE 8.44* 8.15*  CALCIUM 8.6* 8.4*  AST 14* 13*  ALT 29 17  ALKPHOS 137* 112  BILITOT 0.6 0.5   ------------------------------------------------------------------------------------------------------------------  ------------------------------------------------------------------------------------------------------------------ GFR: Estimated Creatinine Clearance: 10.3 mL/min (A) (by C-G formula based on SCr of 8.15 mg/dL (H)). Liver Function Tests: Recent Labs  Lab 12/14/18 1630 12/18/18 0037  AST 14* 13*  ALT 29 17  ALKPHOS 137* 112  BILITOT 0.6 0.5  PROT 7.2 8.1  ALBUMIN 2.4* 2.6*   Recent Labs  Lab 12/14/18 1630 12/18/18 0037  LIPASE 24 19       Imaging Results:    My personal review of EKG: Rhythm NSR, no ST changes.   Assessment & Plan:    Active Problems:   SBP (spontaneous bacterial peritonitis) ([place])   1. Abdominal pain-concern for SBP given patient has leukocytosis with WBC 13,000, peritoneal fluid analysis shows 69% neutrophils, however total cell count is pending.  Patient empirically started on vancomycin and cefepime.  Follow blood culture and peritoneal fluid culture results.  2. Hypokalemia-potassium is 2.7.  Patient received potassium supplementation in the ED.  Will repeat BMP in a.m.  3. Diabetes mellitus type 2-patient is currently n.p.o. due to vomiting and diarrhea.  Consider starting on liquid diet once patient is stable.  Will initiate sliding scale insulin with NovoLog.  Check CBG every 6 hours.  Continue Lantus 14 units subcu daily.  4. Hypertension-blood pressure stable, continue amlodipine, Cardura, labetalol  5. ESRD on peritoneal dialysis-patient will be transferred to [facility].  Will consult nephrology in a.m.   DVT Prophylaxis-Heparin  AM Labs Ordered, also please review Full Orders  Family Communication: Admission, patients condition and plan of care including tests being ordered have been discussed with the  patient  who indicate understanding and agree with the plan and Code Status.  Code Status: Full code  Admission status: Inpatient: Based on patients clinical presentation and evaluation of above clinical data, I have made determination that patient meets Inpatient criteria at this time.  Time spent in minutes : 60 minutes   Oswald Hillock M.D on 12/18/2018 at 4:55 AM

## 2018-12-18 NOTE — ED Notes (Signed)
ED TO INPATIENT HANDOFF REPORT  ED Nurse Name and Phone #: Eustaquio Maize [phone]  S Name/Age/Gender Janet Mitchell 53 y.o. female Room/Bed: APA01/APA01  Code Status   Code Status: Full Code  Home/SNF/Other Home Patient oriented to: self, place, time and situation Is this baseline? Yes   Triage Complete: Triage complete  Chief Complaint Abdominal Pain  Triage Note Pt C/O abdominal pain that has been ongoing since Wednesday. Pt states the pain and vomiting has been worse tonight.    Allergies No Known Allergies  Level of Care/Admitting Diagnosis ED Disposition    ED Disposition Condition [facility] Area: [place] [[id number]]  Level of Care: Med-Surg [16]  Covid Evaluation: Asymptomatic Screening Protocol (No Symptoms)  Diagnosis: SBP (spontaneous bacterial peritonitis) ([place]) [101751]  Admitting Physician: Loree Fee  Attending Physician: Vassie Moment  Estimated length of stay: 3 - 4 days  Certification:: I certify this patient will need inpatient services for at least 2 midnights  PT Class (Do Not Modify): Inpatient [101]  PT Acc Code (Do Not Modify): Private [1]       B Medical/Surgery History Past Medical History:  Diagnosis Date  . Anemia   . CHF (congestive heart failure) ([place])   . Chronic cholecystitis with calculus   . ESRD (end stage renal disease) on dialysis [facility])    "peritoneal dialysis q hs" (03/09/2018)  . Headache    "a few/wk" (03/09/2018)  . History of blood transfusion 10/2017   "low blood count" (03/09/2018)  . Hypertension   . Spinal headache   . Type II diabetes mellitus ([place])    Past Surgical History:  Procedure Laterality Date  . AMPUTATION TOE Left 2013   Great toe  . BIOPSY  10/24/2018   Procedure: BIOPSY;  Surgeon: Daneil Dolin, MD;  [facility];  Service: Endoscopy;;  right and left colon  . CATARACT EXTRACTION W/ INTRAOCULAR LENS IMPLANT Right   . CESAREAN  SECTION  1994; 1998  . CHOLECYSTECTOMY N/A 03/09/2018   Procedure: LAPAROSCOPIC CHOLECYSTECTOMY WITH INTRAOPERATIVE CHOLANGIOGRAM ERAS PATHWAY;  Surgeon: Donnie Mesa, MD;  Location: [facility];  Service: General;  Laterality: N/A;  . COLONOSCOPY N/A 10/24/2018   Procedure: COLONOSCOPY;  Surgeon: Daneil Dolin, MD;  [facility];  Service: Endoscopy;  Laterality: N/A;  1:00pm  . EYE SURGERY  05/15/2018   Removal of blood in the globe (due to DM)  . FLEXIBLE SIGMOIDOSCOPY N/A 11/24/2017   Procedure: FLEXIBLE SIGMOIDOSCOPY;  Surgeon: Daneil Dolin, MD;  [facility];  Service: Endoscopy;  Laterality: N/A;  . LAPAROSCOPIC CHOLECYSTECTOMY  03/09/2018  . PERITONEAL CATHETER INSERTION  2017  . POLYPECTOMY  10/24/2018   Procedure: POLYPECTOMY;  Surgeon: Daneil Dolin, MD;  [facility];  Service: Endoscopy;;  . TOTAL HIP ARTHROPLASTY Left 1997     A IV Location/Drains/Wounds Patient Lines/Drains/Airways Status   Active Line/Drains/Airways    Name:   Placement date:   Placement time:   Site:   Days:   Peripheral IV 12/18/18 Right Antecubital   12/18/18    0040    Antecubital   less than 1          Intake/Output Last 24 hours  Intake/Output Summary (Last 24 hours) at 12/18/2018 1907 Last data filed at 12/18/2018 1337 Gross per 24 hour  Intake 300 ml  Output -  Net 300 ml    Labs/Imaging Results for orders placed  or performed during the hospital encounter of 12/18/18 (from the past 48 hour(s))  CBC     Status: Abnormal   Collection Time: 12/18/18 12:37 AM  Result Value Ref Range   WBC 13.3 (H) 4.0 - 10.5 K/uL   RBC 5.04 3.87 - 5.11 MIL/uL   Hemoglobin 11.3 (L) 12.0 - 15.0 g/dL   HCT 37.8 36.0 - 46.0 %   MCV 75.0 (L) 80.0 - 100.0 fL   MCH 22.4 (L) 26.0 - 34.0 pg   MCHC 29.9 (L) 30.0 - 36.0 g/dL   RDW 19.9 (H) 11.5 - 15.5 %   Platelets 250 150 - 400 K/uL   nRBC 0.0 0.0 - 0.2 %    Comment: Performed at [facility], [address]  [place], [place] [address]  Comprehensive metabolic panel     Status: Abnormal   Collection Time: 12/18/18 12:37 AM  Result Value Ref Range   Sodium 130 (L) 135 - 145 mmol/L   Potassium 2.7 (LL) 3.5 - 5.1 mmol/L    Comment: CRITICAL RESULT CALLED TO, READ BACK BY AND VERIFIED WITH: M DOSS,RN @0132  12/18/18 MKELLY    Chloride 87 (L) 98 - 111 mmol/L   CO2 27 22 - 32 mmol/L   Glucose, Bld 188 (H) 70 - 99 mg/dL   BUN 40 (H) 6 - 20 mg/dL   Creatinine, Ser 8.15 (H) 0.44 - 1.00 mg/dL   Calcium 8.4 (L) 8.9 - 10.3 mg/dL   Total Protein 8.1 6.5 - 8.1 g/dL   Albumin 2.6 (L) 3.5 - 5.0 g/dL   AST 13 (L) 15 - 41 U/L   ALT 17 0 - 44 U/L   Alkaline Phosphatase 112 38 - 126 U/L   Total Bilirubin 0.5 0.3 - 1.2 mg/dL   GFR calc non Af Amer 5 (L) >60 mL/min   GFR calc Af Amer 6 (L) >60 mL/min   Anion gap 16 (H) 5 - 15    Comment: Performed at [facility], [address] [place], [place] [address]  Lipase, blood     Status: None   Collection Time: 12/18/18 12:37 AM  Result Value Ref Range   Lipase 19 11 - 51 U/L    Comment: Performed at [facility], [address] [place], [place] [address]  Lactate dehydrogenase (pleural or peritoneal fluid)     Status: Abnormal   Collection Time: 12/18/18  1:14 AM  Result Value Ref Range   LD, Fluid 42 (H) 3 - 23 U/L    Comment: (NOTE) Results should be evaluated in conjunction with serum values    Fluid Type-FLDH PERITONEAL CAVITY     Comment: Performed at [facility], [address], [place], [place] [address]  Glucose, pleural or peritoneal fluid     Status: None   Collection Time: 12/18/18  1:14 AM  Result Value Ref Range   Glucose, Fluid 182 mg/dL    Comment: (NOTE) No normal range established for this test Results should be evaluated in conjunction with serum values    Fluid Type-FGLU PERITONEAL CAVITY     Comment: Performed at [facility], [address] [place], [place] [address]  Protein, pleural or peritoneal fluid     Status: None   Collection  Time: 12/18/18  1:14 AM  Result Value Ref Range   Total protein, fluid <3.0 g/dL   Fluid Type-FTP PERITONEAL CAVITY     Comment: Performed at [facility], [address] [place], [place] [address]  Albumin, pleural or peritoneal  fluid     Status: None   Collection Time: 12/18/18  1:14 AM  Result Value Ref Range   Albumin, Fluid <1.0 g/dL   Fluid Type-FALB PERITONEAL CAVITY     Comment: Performed at [facility], [address], [place], [place] [address]  Body fluid cell count with differential     Status: Abnormal   Collection Time: 12/18/18  1:14 AM  Result Value Ref Range   Fluid Type-FCT Peritoneal    Color, Fluid YELLOW (A) YELLOW   Appearance, Fluid CLOUDY (A) CLEAR   Total Nucleated Cell Count, Fluid 12,267 (H) 0 - 1,000 cu mm   Neutrophil Count, Fluid 69 (H) 0 - 25 %   Lymphs, Fluid 7 %   Monocyte-Macrophage-Serous Fluid 24 (L) 50 - 90 %    Comment: Performed at [facility], [address], [place], [place] [address]  Culture, body fluid-bottle     Status: None (Preliminary result)   Collection Time: 12/18/18  1:14 AM   Specimen: Peritoneal Cavity  Result Value Ref Range   Specimen Description PERITONEAL CAVITY    Special Requests NONE    Gram Stain      CYTOSPIN SMEAR WBC PRESENT,BOTH PMN AND MONONUCLEAR NO ORGANISMS SEEN    Culture      GRAM POSITIVE COCCI Gram Stain Report Called to,Read Back By and Verified With: DR. [facility] AT 1443 ON [id number] BY THOMPSON S. BOTH AEROBIC AND ANAEROBIC BOTTLES Performed at [facility], [address] [place], [place] [address]    Report Status PENDING   Blood culture (routine x 2)     Status: None (Preliminary result)   Collection Time: 12/18/18  3:42 AM   Specimen: BLOOD  Result Value Ref Range   Specimen Description BLOOD LEFT ANTECUBITAL    Special Requests      BOTTLES DRAWN AEROBIC AND ANAEROBIC Blood Culture adequate volume   Culture      NO GROWTH < 12 HOURS Performed at [facility], [address] [place],  [place] [address]    Report Status PENDING   Blood culture (routine x 2)     Status: None (Preliminary result)   Collection Time: 12/18/18  3:52 AM   Specimen: BLOOD LEFT HAND  Result Value Ref Range   Specimen Description BLOOD LEFT HAND    Special Requests      BOTTLES DRAWN AEROBIC AND ANAEROBIC Blood Culture adequate volume   Culture      NO GROWTH < 12 HOURS Performed at [facility], [address], [place], [place] [address]    Report Status PENDING   Comprehensive metabolic panel     Status: Abnormal   Collection Time: 12/18/18  3:52 AM  Result Value Ref Range   Sodium 131 (L) 135 - 145 mmol/L   Potassium 3.9 3.5 - 5.1 mmol/L    Comment: DELTA CHECK NOTED   Chloride 90 (L) 98 - 111 mmol/L   CO2 28 22 - 32 mmol/L   Glucose, Bld 184 (H) 70 - 99 mg/dL   BUN 41 (H) 6 - 20 mg/dL   Creatinine, Ser 8.67 (H) 0.44 - 1.00 mg/dL   Calcium 8.1 (L) 8.9 - 10.3 mg/dL   Total Protein 7.1 6.5 - 8.1 g/dL   Albumin 2.3 (L) 3.5 - 5.0 g/dL   AST 14 (L) 15 - 41 U/L   ALT 14 0 - 44 U/L   Alkaline Phosphatase 106 38 - 126 U/L   Total Bilirubin 0.7 0.3 - 1.2 mg/dL  GFR calc non Af Amer 5 (L) >60 mL/min   GFR calc Af Amer 6 (L) >60 mL/min   Anion gap 13 5 - 15    Comment: Performed at [facility], [address], [place], [place] [address]  SARS CORONAVIRUS 2 (TAT 6-24 HRS) Nasopharyngeal Nasopharyngeal Swab     Status: None   Collection Time: 12/18/18  4:42 AM   Specimen: Nasopharyngeal Swab  Result Value Ref Range   SARS Coronavirus 2 NEGATIVE NEGATIVE    Comment: (NOTE) SARS-CoV-2 target nucleic acids are NOT DETECTED. The SARS-CoV-2 RNA is generally detectable in upper and lower respiratory specimens during the acute phase of infection. Negative results do not preclude SARS-CoV-2 infection, do not rule out co-infections with other pathogens, and should not be used as the sole basis for treatment or other patient management decisions. Negative results must be combined with clinical  observations, patient history, and epidemiological information. The expected result is Negative. Fact Sheet for Patients: SugarRoll.be Fact Sheet for Healthcare Providers: https://www.woods-mathews.com/ This test is not yet approved or cleared by the [place] FDA and  has been authorized for detection and/or diagnosis of SARS-CoV-2 by FDA under an Emergency Use Authorization (EUA). This EUA will remain  in effect (meaning this test can be used) for the duration of the COVID-19 declaration under Section 56 4(b)(1) of the Act, 21 U.S.C. section 360bbb-3(b)(1), unless the authorization is terminated or revoked sooner. Performed at [facility], [place] [address], [place], [place] [address]   CBG monitoring, ED     Status: Abnormal   Collection Time: 12/18/18  5:40 AM  Result Value Ref Range   Glucose-Capillary 182 (H) 70 - 99 mg/dL  CBG monitoring, ED     Status: Abnormal   Collection Time: 12/18/18 12:03 PM  Result Value Ref Range   Glucose-Capillary 173 (H) 70 - 99 mg/dL  CBG monitoring, ED     Status: Abnormal   Collection Time: 12/18/18  6:04 PM  Result Value Ref Range   Glucose-Capillary 130 (H) 70 - 99 mg/dL   Dg Foot Complete Right  Result Date: 12/17/2018 Please see detailed radiograph report in office note.   Pending Labs Unresulted Labs (From admission, onward)    Start     Ordered   12/21/18 0800  Vancomycin, trough  Once-Timed,   TIMED     12/18/18 1112   12/19/18 7371  Basic metabolic panel  Tomorrow morning,   R     12/18/18 1548   12/19/18 0500  CBC  Tomorrow morning,   R     12/18/18 1548   12/18/18 0537  HIV antibody (Routine Testing)  Once,   STAT     12/18/18 0536   12/18/18 0537  Hemoglobin A1c  Once,   STAT    Comments: To assess prior glycemic control    12/18/18 0536   12/18/18 0114  Pathologist smear review  Once,   STAT     12/18/18 0114   12/18/18 0037  Urinalysis, Routine w reflex microscopic   Once,   STAT     12/18/18 0036          Vitals/Pain Today's Vitals   12/18/18 1815 12/18/18 1830 12/18/18 1845 12/18/18 1900  BP:  103/61  (!) 107/55  Pulse: 75 71 73   Resp:      Temp:      TempSrc:      SpO2: 94% 98% 93%   Weight:      Height:  PainSc:        Isolation Precautions No active isolations  Medications Medications  amLODipine (NORVASC) tablet 10 mg (10 mg Oral Not Given 12/18/18 1012)  doxazosin (CARDURA) tablet 8 mg (has no administration in time range)  labetalol (NORMODYNE) tablet 200 mg (200 mg Oral Given 12/18/18 1011)  ALPRAZolam (XANAX) tablet 0.5 mg (has no administration in time range)  insulin glargine (LANTUS) injection 14 Units (has no administration in time range)  heparin injection 5,000 Units (5,000 Units Subcutaneous Given 12/18/18 1356)  0.9 %  sodium chloride infusion ( Intravenous New Bag/Given 12/18/18 1210)  acetaminophen (TYLENOL) tablet 650 mg (650 mg Oral Given 12/18/18 1011)    Or  acetaminophen (TYLENOL) suppository 650 mg ( Rectal See Alternative 12/18/18 1011)  ondansetron (ZOFRAN) tablet 4 mg (has no administration in time range)    Or  ondansetron (ZOFRAN) injection 4 mg (has no administration in time range)  insulin aspart (novoLOG) injection 0-9 Units (1 Units Subcutaneous Given 12/18/18 1809)  calcitRIOL (ROCALTROL) capsule 1 mcg (has no administration in time range)  calcitRIOL (ROCALTROL) capsule 0.5 mcg (0.5 mcg Oral Given 12/18/18 1011)  ceFEPIme (MAXIPIME) 1 g in sodium chloride 0.9 % 100 mL IVPB (has no administration in time range)  famotidine (PEPCID) tablet 20 mg (has no administration in time range)  morphine 4 MG/ML injection 4 mg (4 mg Intravenous Given 12/18/18 0119)  ondansetron (ZOFRAN) injection 4 mg (4 mg Intravenous Given 12/18/18 0118)  potassium chloride 10 mEq in 100 mL IVPB (0 mEq Intravenous Stopped 12/18/18 0404)  magnesium sulfate IVPB 2 g 50 mL (0 g Intravenous Stopped 12/18/18 0219)  vancomycin  (VANCOCIN) IVPB 1000 mg/200 mL premix (0 mg Intravenous Stopped 12/18/18 0541)  ceFEPIme (MAXIPIME) 2 g in sodium chloride 0.9 % 100 mL IVPB (0 g Intravenous Stopped 12/18/18 0435)  vancomycin (VANCOCIN) 500 mg in sodium chloride 0.9 % 100 mL IVPB (0 mg Intravenous Stopped 12/18/18 1005)  vancomycin (VANCOCIN) IVPB 1000 mg/200 mL premix (0 mg Intravenous Stopped 12/18/18 1337)    Mobility walks Low fall risk   Focused Assessments    R Recommendations: See Admitting Provider Note  Report given to:   Additional Notes:

## 2018-12-18 NOTE — ED Provider Notes (Signed)
[facility] EMERGENCY DEPARTMENT Provider Note   CSN: [id number] Arrival date & time: 12/18/18  0006     History   Chief Complaint Chief Complaint  Patient presents with  . Abdominal Pain    HPI Janet Mitchell is a 53 y.o. female.     HPI  This a 53 year old female with a history of heart failure, end-stage renal disease on peritoneal dialysis, diabetes, hypertension who presents with abdominal pain.  Patient reports onset of lower abdominal pain for 5 days ago.  She has associated nonbilious, nonbloody emesis and nonbloody diarrhea.  She denies any fevers.  She reports that the pain is crampy and sharp.  It is nonradiating.  It is now "all over."  She last dialyzed last night.  She did note that the dialysate was cloudy in appearance.  She has not had any fevers.  She does not make urine and does not have any urinary symptoms.  Patient states that she took Aleve without improvement of symptoms.  Currently she rates her pain at 8 out of 10.  Past Medical History:  Diagnosis Date  . Anemia   . CHF (congestive heart failure) ([place])   . Chronic cholecystitis with calculus   . ESRD (end stage renal disease) on dialysis [facility])    "peritoneal dialysis q hs" (03/09/2018)  . Headache    "a few/wk" (03/09/2018)  . History of blood transfusion 10/2017   "low blood count" (03/09/2018)  . Hypertension   . Spinal headache   . Type II diabetes mellitus [facility])     Patient Active Problem List   Diagnosis Date Noted  . Abdominal pain 11/28/2018  . Rectal burning 08/27/2018  . Rectal bleeding 08/27/2018  . Diarrhea 05/22/2018  . Fluid overload 03/25/2018  . Microcytic anemia 03/25/2018  . Chronic cholecystitis with calculus 03/09/2018  . Type II diabetes mellitus with renal manifestations ([place]) 03/09/2018  . Pre-transplant evaluation for ESRD (end stage renal disease) 12/19/2017  . Symptomatic anemia 11/06/2017  . Hypertension 11/06/2017  . ESRD on peritoneal dialysis ([place])  11/06/2017  . Diabetic macular edema ([place]) 04/10/2015  . Edema of lower extremity 04/10/2015  . Uncontrolled type 2 diabetes mellitus ([place]) 04/10/2015    Past Surgical History:  Procedure Laterality Date  . AMPUTATION TOE Left 2013   Great toe  . BIOPSY  10/24/2018   Procedure: BIOPSY;  Surgeon: Daneil Dolin, MD;  Location: [facility] ENDO SU[facility];  Service: Endoscopy;;  right and left colon  . CATARACT EXTRACTION W/ INTRAOCULAR LENS IMPLANT Right   . CESAREAN SECTION  1994; 1998  . CHOLECYSTECTOMY N/A 03/09/2018   Procedure: LAPAROSCOPIC CHOLECYSTECTOMY WITH INTRAOPERATIVE CHOLANGIOGRAM ERAS PATHWAY;  Surgeon: Donnie Mesa, MD;  Location: [place];  Service: General;  Laterality: N/A;  . COLONOSCOPY N/A 10/24/2018   Procedure: COLONOSCOPY;  Surgeon: Daneil Dolin, MD;  Location: [facility];  Service: Endoscopy;  Laterality: N/A;  1:00pm  . EYE SURGERY  05/15/2018   Removal of blood in the globe (due to DM)  . FLEXIBLE SIGMOIDOSCOPY N/A 11/24/2017   Procedure: FLEXIBLE SIGMOIDOSCOPY;  Surgeon: Daneil Dolin, MD;  Location[facility];  Service: Endoscopy;  Laterality: N/A;  . LAPAROSCOPIC CHOLECYSTECTOMY  03/09/2018  . PERITONEAL CATHETER INSERTION  2017  . POLYPECTOMY  10/24/2018   Procedure: POLYPECTOMY;  Surgeon: Daneil Dolin, MD;  Location[facility];  Service: Endoscopy;;  . TOTAL HIP ARTHROPLASTY Left 1997     OB History    Gravida  2  Para  2   Term      Preterm      AB      Living  2     SAB      TAB      Ectopic      Multiple      Live Births               Home Medications    Prior to Admission medications   Medication Sig Start Date End Date Taking? Authorizing Provider  ALPRAZolam Duanne Moron) 0.5 MG tablet Take 0.5 mg by mouth at bedtime as needed for anxiety.    [provider]  amLODipine (NORVASC) 10 MG tablet Take 1 tablet (10 mg total) by mouth daily. 11/09/17   Roxan Hockey, MD  calcitRIOL (ROCALTROL) 0.5 MCG capsule Take  0.5-1 mcg by mouth See admin instructions. 0.60mcg on Mondays through Fridays and take 59mcg on Saturdays and Sundays    [provider]  Crisaborole (EUCRISA) 2 % OINT Apply 1 application topically daily as needed (for rash/irritation).    [provider]  doxazosin (CARDURA) 8 MG tablet Take 8 mg by mouth at bedtime.    [provider]  Insulin Glargine (TOUJEO MAX SOLOSTAR) 300 UNIT/ML SOPN Inject 14 Units into the skin at bedtime. 11/09/17   Roxan Hockey, MD  labetalol (NORMODYNE) 200 MG tablet Take 200 mg by mouth 2 (two) times daily.    [provider]  mupirocin ointment (BACTROBAN) 2 % Apply to right foot ulcer once daily and cover with dressing. 12/17/18 12/31/18  Marzetta Board, DPM    Family History Family History  Problem Relation Age of Onset  . Heart disease Mother   . Thrombocytopenia Mother        TTP  . Heart failure Father   . Kidney disease Paternal Grandfather   . Colon cancer Neg Hx     Social History Social History   Tobacco Use  . Smoking status: Passive Smoke Exposure - Never Smoker  . Smokeless tobacco: Never Used  Substance Use Topics  . Alcohol use: Never    Frequency: Never  . Drug use: Never     Allergies   Patient has no known allergies.   Review of Systems Review of Systems  Constitutional: Negative for fever.  Respiratory: Negative for shortness of breath.   Cardiovascular: Negative for chest pain.  Gastrointestinal: Positive for abdominal pain, diarrhea, nausea and vomiting. Negative for blood in stool.  Neurological: Negative for headaches.  All other systems reviewed and are negative.    Physical Exam Updated Vital Signs BP 122/67   Pulse 75   Temp 98.4 F (36.9 C) (Oral)   Resp 17   Ht 1.727 m (5\' 8" )   Wt 104.3 kg   SpO2 100%   BMI 34.97 kg/m   Physical Exam Vitals signs and nursing note reviewed.  Constitutional:      Appearance: She is well-developed. She is not ill-appearing.   HENT:     Head: Normocephalic and atraumatic.     Mouth/Throat:     Mouth: Mucous membranes are dry.  Eyes:     Pupils: Pupils are equal, round, and reactive to light.  Neck:     Musculoskeletal: Neck supple.  Cardiovascular:     Rate and Rhythm: Normal rate and regular rhythm.     Heart sounds: Normal heart sounds.  Pulmonary:     Effort: Pulmonary effort is normal. No respiratory distress.  Breath sounds: No wheezing.  Abdominal:     General: Bowel sounds are normal.     Palpations: Abdomen is soft.     Comments: Dialysis catheter without associated erythema or drainage, diffuse tenderness to palpation, no rebound or guarding  Musculoskeletal:     Right lower leg: No edema.     Left lower leg: No edema.  Skin:    General: Skin is warm and dry.  Neurological:     Mental Status: She is alert and oriented to person, place, and time.  Psychiatric:        Mood and Affect: Mood normal.      ED Treatments / Results  Labs (all labs ordered are listed, but only abnormal results are displayed) Labs Reviewed  CBC - Abnormal; Notable for the following components:      Result Value   WBC 13.3 (*)    Hemoglobin 11.3 (*)    MCV 75.0 (*)    MCH 22.4 (*)    MCHC 29.9 (*)    RDW 19.9 (*)    All other components within normal limits  COMPREHENSIVE METABOLIC PANEL - Abnormal; Notable for the following components:   Sodium 130 (*)    Potassium 2.7 (*)    Chloride 87 (*)    Glucose, Bld 188 (*)    BUN 40 (*)    Creatinine, Ser 8.15 (*)    Calcium 8.4 (*)    Albumin 2.6 (*)    AST 13 (*)    GFR calc non Af Amer 5 (*)    GFR calc Af Amer 6 (*)    Anion gap 16 (*)    All other components within normal limits  LACTATE DEHYDROGENASE, PLEURAL OR PERITONEAL FLUID - Abnormal; Notable for the following components:   LD, Fluid 42 (*)    All other components within normal limits  BODY FLUID CELL COUNT WITH DIFFERENTIAL - Abnormal; Notable for the following components:   Color,  Fluid YELLOW (*)    Appearance, Fluid CLOUDY (*)    Neutrophil Count, Fluid 69 (*)    Monocyte-Macrophage-Serous Fluid 24 (*)    All other components within normal limits  CULTURE, BODY FLUID-BOTTLE  CULTURE, BLOOD (ROUTINE X 2)  CULTURE, BLOOD (ROUTINE X 2)  LIPASE, BLOOD  GLUCOSE, PLEURAL OR PERITONEAL FLUID  PROTEIN, PLEURAL OR PERITONEAL FLUID  ALBUMIN, PLEURAL OR PERITONEAL FLUID  URINALYSIS, ROUTINE W REFLEX MICROSCOPIC  PATHOLOGIST SMEAR REVIEW    EKG None  Radiology Dg Foot Complete Right  Result Date: 12/17/2018 Please see detailed radiograph report in office note.   Procedures Procedures (including critical care time)  CRITICAL CARE Performed by: Merryl Hacker   Total critical care time: 31 minutes  Critical care time was exclusive of separately billable procedures and treating other patients.  Critical care was necessary to treat or prevent imminent or life-threatening deterioration.  Critical care was time spent personally by me on the following activities: development of treatment plan with patient and/or surrogate as well as nursing, discussions with consultants, evaluation of patient's response to treatment, examination of patient, obtaining history from patient or surrogate, ordering and performing treatments and interventions, ordering and review of laboratory studies, ordering and review of radiographic studies, pulse oximetry and re-evaluation of patient's condition.   Medications Ordered in ED Medications  potassium chloride 10 mEq in 100 mL IVPB (10 mEq Intravenous New Bag/Given 12/18/18 0301)  vancomycin (VANCOCIN) IVPB 1000 mg/200 mL premix (has no administration in time range)  ceFEPIme (  MAXIPIME) 2 g in sodium chloride 0.9 % 100 mL IVPB (has no administration in time range)  morphine 4 MG/ML injection 4 mg (4 mg Intravenous Given 12/18/18 0119)  ondansetron (ZOFRAN) injection 4 mg (4 mg Intravenous Given 12/18/18 0118)  magnesium sulfate IVPB  2 g 50 mL (0 g Intravenous Stopped 12/18/18 0219)     Initial Impression / Assessment and Plan / ED Course  I have reviewed the triage vital signs and the nursing notes.  Pertinent labs & imaging results that were available during my care of the patient were reviewed by me and considered in my medical decision making (see chart for details).        Patient presents with abdominal pain, nausea, vomiting, and diarrhea.  She is afebrile and overall nontoxic-appearing.  Vital signs are reassuring.  She does have tenderness on exam and reports cloudy peritoneal fluid.  Patient was given morphine and Zofran.  Basic lab work obtained.  A sample of her peritoneal fluid was also obtained for peritoneal studies.  She has a leukocytosis to 13.  She is also hypokalemic with potassium of 2.7.  She was given 20 mg IV of potassium and 1 run of 2 mg of magnesium.  Additionally, her dialysate fluid showed 69% neutrophils.  I called the lab and they do not have an absolute white cell count.  There are no organisms on her Gram stain.  However, given the percentage of neutrophils, will treat for SBP.  Patient was given vancomycin and cefepime.  She does not appear septic at this time.  Will admit to the hospitalist for further management.  Final Clinical Impressions(s) / ED Diagnoses   Final diagnoses:  Generalized abdominal pain  SBP (spontaneous bacterial peritonitis) ([place])  Hypokalemia    ED Discharge Orders    None       Merryl Hacker, MD 12/18/18 [phone]

## 2018-12-18 NOTE — Progress Notes (Signed)
Subjective: Patient presents today with h/o diabetes, ESRD on peritoneal dialysis and s/p amputation left hallux for at-risk foot care.  She had her amputation several years ago in [place], [place].  She states at times her right foot swells and her diabetic shoe is tighter on this foot. She voices no other pedal concerns on today's visit.   Lucianne Lei, MD is her PCP.   Alpha-Beta Blockers   labetalol (NORMODYNE) 200 MG tablet 200 mg, 2 times daily          Antiadrenergic Antihypertensives   doxazosin (CARDURA) 8 MG tablet 8 mg, Daily at bedtime          Antibiotics - Topical   mupirocin ointment (BACTROBAN) 2 %           Benzodiazepines   ALPRAZolam (XANAX) 0.5 MG tablet 0.5 mg, At bedtime PRN          Calcium Channel Blockers   amLODipine (NORVASC) 10 MG tablet 10 mg, Daily                    Insulin   Insulin Glargine (TOUJEO MAX SOLOSTAR) 300 UNIT/ML SOPN 14 Units, Daily at bedtime          Metabolic Modifiers   calcitRIOL (ROCALTROL) 0.5 MCG capsule 0.5-1 mcg, See admin instructions          Phosphodiesterase 4 (PDE4) Inhibitors - Topical   Crisaborole (EUCRISA) 2 % OINT 1    No Known Allergies   Objective:  Vascular Examination: Capillary refill time <3 seconds x 9 digits.  Dorsalis pedis pulses 1/4 b/l.  Posterior tibial pulses 1/4 b/l.  Digital hair sparse b/l.  Skin temperature gradient WNL b/l.  Dermatological Examination: Skin with normal turgor, texture and tone b/l.  Toenails 1-5 right, 2-5 left discolored, thick, dystrophic with subungual debris and pain with palpation to nailbeds due to thickness of nails.  Hyperkeratotic lesion postoperative scar left foot with subdermal hemorrhage.  No edema, no erythema, no drainage, no flocculence.  Ulceration located medial 1st metatarsal head right foot.  Predebridement measurements carried out today of 0.3 x 0.3 cm with hyperkeratotic roof.  There is hyperkeratotic roof with centralized heme. No  perilesion erythema, no edema, no drainage. No pain on palpation.    Postdebridement measurements today are: 0.2 x 0.2 x 0.1 cm. No undermining, no tunneling, no probing to bone,  no active pus or purulence noted. No odor.   Musculoskeletal: Muscle strength 5/5 to all LE muscle groups b/l.  S/p 1st ray amputation left foot.  Neurological: Sensation diminished b/l with 10 gram monofilament.  Xray Right Foot: +Calcified vessels +Plantar/Posterior heel spurs +Calcium deposits within muscle No bone erosion noted at site of ulceration No gas in tissues  Assessment: 1. Onychomycosis 1-5 right, 2-5 left 2. Diabetic Ulceration right 1st metatarsal 3. Preulcerative callus 1st metatarsal 4. NIDDM with neuropathy  Plan: 1. Toenails 1-5 right, 2-5 left were debrided in length and girth without iatrogenic bleeding. 2. Right foot ulcer was debrided of reactive hyperkeratoses to the level of of viable tissue of subcutaneous layer. Ulcer was cleansed with wound cleanser. Triple antibiotic ointment was applied to base of wound with light dressing. 3. Xray was performed and reviewed of the right foot with patient. 4. Surgical shoe was dispensed for right foot. 5. Prescription written for Mupirocin Ointment to be applied to right foot ulcer once daily.  6. Keep right foot dry at all times. Patient was given written instructions on offloading  and dressing change/aftercare and was instructed to call immediately if any signs or symptoms of infection arise.  7. Patient instructed to report to emergency department with worsening appearance of ulcer/toe/foot, increased pain, foul odor, increased redness, swelling, drainage, fever, chills, nightsweats, nausea, vomiting, increased blood sugar.  8. Patient/POA related understanding. 9. Follow up one week with Dr. Hardie Pulley for right foot ulceration. 10. Patient/POA to call should there be a concern in the interim.

## 2018-12-19 ENCOUNTER — Encounter (HOSPITAL_COMMUNITY): Payer: Self-pay

## 2018-12-19 DIAGNOSIS — N186 End stage renal disease: Secondary | ICD-10-CM

## 2018-12-19 DIAGNOSIS — T8571XA Infection and inflammatory reaction due to peritoneal dialysis catheter, initial encounter: Secondary | ICD-10-CM

## 2018-12-19 DIAGNOSIS — I10 Essential (primary) hypertension: Secondary | ICD-10-CM

## 2018-12-19 LAB — GLUCOSE, CAPILLARY
Glucose-Capillary: 162 mg/dL — ABNORMAL HIGH (ref 70–99)
Glucose-Capillary: 182 mg/dL — ABNORMAL HIGH (ref 70–99)
Glucose-Capillary: 201 mg/dL — ABNORMAL HIGH (ref 70–99)
Glucose-Capillary: 233 mg/dL — ABNORMAL HIGH (ref 70–99)
Glucose-Capillary: 95 mg/dL (ref 70–99)

## 2018-12-19 LAB — RENAL FUNCTION PANEL
Albumin: 1.8 g/dL — ABNORMAL LOW (ref 3.5–5.0)
Anion gap: 14 (ref 5–15)
BUN: 47 mg/dL — ABNORMAL HIGH (ref 6–20)
CO2: 27 mmol/L (ref 22–32)
Calcium: 8.1 mg/dL — ABNORMAL LOW (ref 8.9–10.3)
Chloride: 87 mmol/L — ABNORMAL LOW (ref 98–111)
Creatinine, Ser: 10.48 mg/dL — ABNORMAL HIGH (ref 0.44–1.00)
GFR calc Af Amer: 4 mL/min — ABNORMAL LOW (ref >60)
GFR calc non Af Amer: 4 mL/min — ABNORMAL LOW (ref >60)
Glucose, Bld: 95 mg/dL (ref 70–99)
Phosphorus: 6 mg/dL — ABNORMAL HIGH (ref 2.5–4.6)
Potassium: 2.8 mmol/L — ABNORMAL LOW (ref 3.5–5.1)
Sodium: 128 mmol/L — ABNORMAL LOW (ref 135–145)

## 2018-12-19 LAB — CBC
HCT: 30.5 % — ABNORMAL LOW (ref 36.0–46.0)
Hemoglobin: 9.6 g/dL — ABNORMAL LOW (ref 12.0–15.0)
MCH: 23.2 pg — ABNORMAL LOW (ref 26.0–34.0)
MCHC: 31.5 g/dL (ref 30.0–36.0)
MCV: 73.8 fL — ABNORMAL LOW (ref 80.0–100.0)
Platelets: 239 10*3/uL (ref 150–400)
RBC: 4.13 MIL/uL (ref 3.87–5.11)
RDW: 19.5 % — ABNORMAL HIGH (ref 11.5–15.5)
WBC: 6.6 10*3/uL (ref 4.0–10.5)
nRBC: 0 % (ref 0.0–0.2)

## 2018-12-19 LAB — HIV ANTIBODY (ROUTINE TESTING W REFLEX): HIV Screen 4th Generation wRfx: NONREACTIVE

## 2018-12-19 LAB — HEMOGLOBIN A1C
Hgb A1c MFr Bld: 6.7 % — ABNORMAL HIGH (ref 4.8–5.6)
Mean Plasma Glucose: 146 mg/dL

## 2018-12-19 MED ORDER — POLYETHYLENE GLYCOL 3350 17 G PO PACK
17.0000 g | PACK | Freq: Every day | ORAL | Status: DC
  Administered 2018-12-19: 17 g via ORAL
  Filled 2018-12-19 (×2): qty 1

## 2018-12-19 MED ORDER — DELFLEX-LC/2.5% DEXTROSE 394 MOSM/L IP SOLN
INTRAPERITONEAL | Status: DC
  Filled 2018-12-19: qty 5000

## 2018-12-19 MED ORDER — HEPARIN SODIUM (PORCINE) 1000 UNIT/ML IJ SOLN
INTRAPERITONEAL | Status: DC | PRN
  Filled 2018-12-19: qty 5000

## 2018-12-19 MED ORDER — DELFLEX-LC/2.5% DEXTROSE 394 MOSM/L IP SOLN: INTRAPERITONEAL | Status: DC

## 2018-12-19 MED ORDER — POTASSIUM CHLORIDE CRYS ER 20 MEQ PO TBCR
40.0000 meq | EXTENDED_RELEASE_TABLET | Freq: Once | ORAL | Status: AC
  Administered 2018-12-19: 40 meq via ORAL
  Filled 2018-12-19: qty 2

## 2018-12-19 MED ORDER — HEPARIN SODIUM (PORCINE) 1000 UNIT/ML IJ SOLN
INTRAPERITONEAL | Status: DC | PRN
  Filled 2018-12-19 (×2): qty 5000

## 2018-12-19 MED ORDER — HEPARIN 1000 UNIT/ML FOR PERITONEAL DIALYSIS: 500.0000 [IU] | INTRAMUSCULAR | Status: DC | PRN

## 2018-12-19 MED ORDER — GENTAMICIN SULFATE 0.1 % EX CREA
1.0000 "application " | TOPICAL_CREAM | Freq: Every day | CUTANEOUS | Status: DC
  Administered 2018-12-19: 1 via TOPICAL
  Filled 2018-12-19: qty 15

## 2018-12-19 MED ORDER — DELFLEX-LC/2.5% DEXTROSE 394 MOSM/L IP SOLN
INTRAPERITONEAL | Status: DC
  Administered 2018-12-19: 20:00:00 via INTRAPERITONEAL
  Filled 2018-12-19 (×2): qty 5000

## 2018-12-19 NOTE — Progress Notes (Addendum)
Initial Nutrition Assessment  DOCUMENTATION CODES:   Obesity unspecified  INTERVENTION:    RD to encourage PO intake  MVI daily   NUTRITION DIAGNOSIS:   Increased nutrient needs related to acute illness as evidenced by estimated needs.  GOAL:   Patient will meet greater than or equal to 90% of their needs  MONITOR:   PO intake, Supplement acceptance, Weight trends, Labs, I & O's  REASON FOR ASSESSMENT:   Malnutrition Screening Tool    ASSESSMENT:   Patient with PMH significant for CHF, ESRD on PD, DM, and HTN. Presents this admission with abdominal pain from SBP.   RD working remotely.  Spoke with pt via phone. Denies having loss of appetite PTA but reports she has experience nausea/diarrhea/bloating after each meal since having her gallbladder removed in December. States no particular type of food makes it worse. She typically eats three meals daily with snacks that include high protein options. Meal completions charted as 100% this admission. She continues to experience bloating but nausea/diarrhea have subsided. Does not wish to have supplements or snacks.   Pt endorses a UBW of 110-115 kg (fluctuates with PD) and a unintentional wt loss of 5 kg since December. Records indicate pt weighed 116.1 kg on 10/24/18 and 106 kg this admission (8.7% wt loss in 2 months, significant for time frame).   She has been compliant with PD regimen (3 exchanges at night with 2.5% and 4th fill with icodextran).   I/O: +838 ml since admit  Medications: calcitriol, SS novolog, lantus Labs: Na 128 (L) Phosphorus 6.0 corrected calcium 9.9 (wdl) CBG 95-182  Diet Order:   Diet Order            DIET SOFT Room service appropriate? Yes; Fluid consistency: Thin  Diet effective now              EDUCATION NEEDS:   Education needs have been addressed  Skin:  Skin Assessment: Reviewed RN Assessment  Last BM:  9/21  Height:   Ht Readings from Last 1 Encounters:  12/18/18 5\' 8"   (1.727 m)    Weight:   Wt Readings from Last 1 Encounters:  12/18/18 106 kg    Ideal Body Weight:  63.6 kg  BMI:  Body mass index is 35.53 kg/m.  Estimated Nutritional Needs:   Kcal:  2000-2200 kcal  Protein:  100-115 grams  Fluid:  2 L/day   Mariana Single RD, LDN Clinical Nutrition Pager # - [phone]

## 2018-12-19 NOTE — Progress Notes (Addendum)
PROGRESS NOTE        PATIENT DETAILS Name: Janet Mitchell Age: 53 y.o. Sex: female Date of Birth: [birth year] Admit Date: 12/18/2018 Admitting Physician Vianne Bulls, MD [id number], Myra Rude, MD  Brief Narrative: Patient is a 53 y.o. female with history of ESRD on PD-presented with abdominal pain-found to have peritonitis.  See below for further details  Subjective: Feels much better-hardly any abdominal pain today.  Nausea/vomiting has resolved.  Assessment/Plan: Peritoneal dialysis associated peritonitis: Improved-abdominal pain has gotten much better-no further nausea vomiting.  PD fluid culture positive for Streptococcus mitis-suspect we could narrow down antibiotics to just ceftriaxone.  Stop vancomycin and cefepime.  Monitor overnight-if clinical improvement continues-suspect we could consider discharge in the next day or so.  Hyponatremia: Probably related to volume issues with renal failure-should improve with ongoing peritoneal dialysis.  Repeat electrolytes tomorrow.  Hypokalemia: Replete and recheck.  Insulin independent DM-2: CBG stable with 14 units of Lantus daily and SSI.  HTN: Blood pressure controlled-continue Cardura, labetalol and amlodipine  ESRD: On PD-Per nephrology  Obesity: Estimated body mass index is 35.53 kg/m as calculated from the following:   Height as of this encounter: 5\' 8"  (1.727 m).   Weight as of this encounter: 106 kg.    Diet: Diet Order            DIET SOFT Room service appropriate? Yes; Fluid consistency: Thin  Diet effective now               DVT Prophylaxis: Prophylactic Heparin  Code Status: Full code  Family Communication: None at bedside  Disposition Plan: Remain inpatient-but will plan on Home health vs SNF on discharge  Antimicrobial agents: Anti-infectives (From admission, onward)   Start     Dose/Rate Route Frequency Ordered Stop   12/19/18 1000  ceFEPIme (MAXIPIME) 625 mg in  dialysis solution 2.5% low-MG/low-CA 5,000 mL dialysis solution  Status:  Discontinued      Peritoneal Dialysis Every 24 hours 12/19/18 0833 12/19/18 0859   12/19/18 1000  ceFEPIme (MAXIPIME) 625 mg in dialysis solution 2.5% low-MG/low-CA 5,000 mL dialysis solution      Peritoneal Dialysis Every 24 hours 12/19/18 0859     12/19/18 0827  ceFEPIme (MAXIPIME) 625 mg, heparin 2,500 Units in dialysis solution 2.5% low-MG/low-CA 5,000 mL dialysis solution      Peritoneal Dialysis As needed 12/19/18 0828     12/19/18 0600  ceFEPIme (MAXIPIME) 1 g in sodium chloride 0.9 % 100 mL IVPB  Status:  Discontinued     1 g 200 mL/hr over 30 Minutes Intravenous Every 24 hours 12/18/18 1036 12/19/18 0836   12/18/18 1100  vancomycin (VANCOCIN) IVPB 1000 mg/200 mL premix     1,000 mg 200 mL/hr over 60 Minutes Intravenous  Once 12/18/18 1055 12/18/18 1337   12/18/18 0745  vancomycin (VANCOCIN) 500 mg in sodium chloride 0.9 % 100 mL IVPB     500 mg 100 mL/hr over 60 Minutes Intravenous  Once 12/18/18 0731 12/18/18 1005   12/18/18 0345  vancomycin (VANCOCIN) IVPB 1000 mg/200 mL premix     1,000 mg 200 mL/hr over 60 Minutes Intravenous  Once 12/18/18 0333 12/18/18 0541   12/18/18 0345  ceFEPIme (MAXIPIME) 2 g in sodium chloride 0.9 % 100 mL IVPB     2 g 200 mL/hr over 30 Minutes Intravenous  Once 12/18/18 9379  12/18/18 0435      Procedures: None  CONSULTS:  nephrology  Time spent: 25 minutes-Greater than 50% of this time was spent in counseling, explanation of diagnosis, planning of further management, and coordination of care.  MEDICATIONS: Scheduled Meds: . calcitRIOL  0.5 mcg Oral Once per day on Mon Tue Wed Thu Fri  . [START ON 12/22/2018] calcitRIOL  1 mcg Oral Once per day on Sun Sat  . doxazosin  8 mg Oral QHS  . gentamicin cream  1 application Topical Daily  . heparin  5,000 Units Subcutaneous Q8H  . insulin aspart  0-9 Units Subcutaneous Q6H  . insulin glargine  14 Units Subcutaneous QHS  .  labetalol  200 mg Oral BID   Continuous Infusions: . sodium chloride 10 mL/hr at 12/19/18 0626  . dianeal solution for CAPD/CCPD with additives    . dianeal solution for CAPD/CCPD with additives     PRN Meds:.acetaminophen **OR** acetaminophen, ALPRAZolam, dianeal solution for CAPD/CCPD with additives, famotidine, heparin, ondansetron **OR** ondansetron (ZOFRAN) IV   PHYSICAL EXAM: Vital signs: Vitals:   12/18/18 2300 12/18/18 2356 12/19/18 0414 12/19/18 0841  BP: 118/69 121/69 137/79 129/71  Pulse:  72 70 70  Resp:  18 18 18   Temp: 98.1 F (36.7 C) 98.3 F (36.8 C) 98.4 F (36.9 C) 98.2 F (36.8 C)  TempSrc: Oral Oral Oral Oral  SpO2:  (!) 87% 95% 93%  Weight:  106 kg    Height:       Filed Weights   12/18/18 0025 12/18/18 2356  Weight: 104.3 kg 106 kg   Body mass index is 35.53 kg/m.   Gen Exam:Alert awake-not in any distress HEENT:atraumatic, normocephalic Chest: B/L clear to auscultation anteriorly CVS:S1S2 regular Abdomen:soft non tender, non distended Extremities:no edema Neurology: Non focal Skin: no rash  I have personally reviewed following labs and imaging studies  LABORATORY DATA: CBC: Recent Labs  Lab 12/14/18 1630 12/18/18 0037 12/19/18 0726  WBC 8.2 13.3* 6.6  HGB 10.6* 11.3* 9.6*  HCT 34.3* 37.8 30.5*  MCV 76.1* 75.0* 73.8*  PLT 239 250 500    Basic Metabolic Panel: Recent Labs  Lab 12/14/18 1630 12/18/18 0037 12/18/18 0352 12/19/18 0726  NA 130* 130* 131* 128*  K 2.8* 2.7* 3.9 2.8*  CL 89* 87* 90* 87*  CO2 28 27 28 27   GLUCOSE 205* 188* 184* 95  BUN 33* 40* 41* 47*  CREATININE 8.44* 8.15* 8.67* 10.48*  CALCIUM 8.6* 8.4* 8.1* 8.1*  PHOS  --   --   --  6.0*    GFR: Estimated Creatinine Clearance: 8.1 mL/min (A) (by C-G formula based on SCr of 10.48 mg/dL (H)).  Liver Function Tests: Recent Labs  Lab 12/14/18 1630 12/18/18 0037 12/18/18 0352 12/19/18 0726  AST 14* 13* 14*  --   ALT 29 17 14   --   ALKPHOS 137* 112  106  --   BILITOT 0.6 0.5 0.7  --   PROT 7.2 8.1 7.1  --   ALBUMIN 2.4* 2.6* 2.3* 1.8*   Recent Labs  Lab 12/14/18 1630 12/18/18 0037  LIPASE 24 19   No results for input(s): AMMONIA in the last 168 hours.  Coagulation Profile: No results for input(s): INR, PROTIME in the last 168 hours.  Cardiac Enzymes: No results for input(s): CKTOTAL, CKMB, CKMBINDEX, TROPONINI in the last 168 hours.  BNP (last 3 results) No results for input(s): PROBNP in the last 8760 hours.  HbA1C: Recent Labs    12/18/18  0352  HGBA1C 6.7*    CBG: Recent Labs  Lab 12/18/18 1203 12/18/18 1804 12/18/18 2248 12/19/18 0026 12/19/18 0552  GLUCAP 173* 130* 160* 162* 95    Lipid Profile: No results for input(s): CHOL, HDL, LDLCALC, TRIG, CHOLHDL, LDLDIRECT in the last 72 hours.  Thyroid Function Tests: No results for input(s): TSH, T4TOTAL, FREET4, T3FREE, THYROIDAB in the last 72 hours.  Anemia Panel: No results for input(s): VITAMINB12, FOLATE, FERRITIN, TIBC, IRON, RETICCTPCT in the last 72 hours.  Urine analysis:    Component Value Date/Time   COLORURINE AMBER (A) 07/31/2018 1800   APPEARANCEUR TURBID (A) 07/31/2018 1800   LABSPEC >1.046 (H) 07/31/2018 1800   PHURINE 5.0 07/31/2018 1800   GLUCOSEU 150 (A) 07/31/2018 1800   HGBUR SMALL (A) 07/31/2018 1800   BILIRUBINUR NEGATIVE 07/31/2018 1800   KETONESUR NEGATIVE 07/31/2018 1800   PROTEINUR >=300 (A) 07/31/2018 1800   NITRITE NEGATIVE 07/31/2018 1800   LEUKOCYTESUR MODERATE (A) 07/31/2018 1800    Sepsis Labs: Lactic Acid, Venous No results found for: LATICACIDVEN  MICROBIOLOGY: Recent Results (from the past 240 hour(s))  Culture, body fluid-bottle     Status: Abnormal (Preliminary result)   Collection Time: 12/18/18  1:14 AM   Specimen: Peritoneal Cavity  Result Value Ref Range Status   Specimen Description   Final    PERITONEAL CAVITY Performed at [facility], [address], [place], [place] [address]    Special  Requests   Final    BOTTLES DRAWN AEROBIC AND ANAEROBIC 10CC Performed at [facility], [address], [place], [place] [address]    Gram Stain   Final    CYTOSPIN SMEAR WBC PRESENT,BOTH PMN AND MONONUCLEAR NO ORGANISMS SEEN Performed at [facility], [address], [place], [place] [address]    Culture (A)  Final    STREPTOCOCCUS MITIS/ORALIS Gram Stain Report Called to,Read Back By and Verified With: DR. [facility] AT 3419 ON [id number] BY THOMPSON S. BOTH AEROBIC AND ANAEROBIC BOTTLES Performed at [facility], [place] [address], [place], [place] [address]    Report Status PENDING  Incomplete  Blood culture (routine x 2)     Status: None (Preliminary result)   Collection Time: 12/18/18  3:42 AM   Specimen: BLOOD  Result Value Ref Range Status   Specimen Description BLOOD LEFT ANTECUBITAL  Final   Special Requests   Final    BOTTLES DRAWN AEROBIC AND ANAEROBIC Blood Culture adequate volume   Culture   Final    NO GROWTH 1 DAY Performed at [facility], [address] [place], [place] [address]    Report Status PENDING  Incomplete  Blood culture (routine x 2)     Status: None (Preliminary result)   Collection Time: 12/18/18  3:52 AM   Specimen: BLOOD LEFT HAND  Result Value Ref Range Status   Specimen Description BLOOD LEFT HAND  Final   Special Requests   Final    BOTTLES DRAWN AEROBIC AND ANAEROBIC Blood Culture adequate volume   Culture   Final    NO GROWTH 1 DAY Performed at [facility], [address], [place], [place] [address]    Report Status PENDING  Incomplete  SARS CORONAVIRUS 2 (TAT 6-24 HRS) Nasopharyngeal Nasopharyngeal Swab     Status: None   Collection Time: 12/18/18  4:42 AM   Specimen: Nasopharyngeal Swab  Result Value Ref Range Status   SARS Coronavirus 2 NEGATIVE NEGATIVE Final    Comment: (NOTE) SARS-CoV-2 target nucleic acids are  NOT DETECTED. The SARS-CoV-2 RNA is generally detectable in upper and lower respiratory specimens during the acute phase of  infection. Negative results do not preclude SARS-CoV-2 infection, do not rule out co-infections with other pathogens, and should not be used as the sole basis for treatment or other patient management decisions. Negative results must be combined with clinical observations, patient history, and epidemiological information. The expected result is Negative. Fact Sheet for Patients: SugarRoll.be Fact Sheet for Healthcare Providers: https://www.woods-mathews.com/ This test is not yet approved or cleared by the [place] FDA and  has been authorized for detection and/or diagnosis of SARS-CoV-2 by FDA under an Emergency Use Authorization (EUA). This EUA will remain  in effect (meaning this test can be used) for the duration of the COVID-19 declaration under Section 56 4(b)(1) of the Act, 21 U.S.C. section 360bbb-3(b)(1), unless the authorization is terminated or revoked sooner. Performed at [facility], [place] [address], [place], [place] [address]     RADIOLOGY STUDIES/RESULTS: Dg Foot Complete Right  Result Date: 12/17/2018 Please see detailed radiograph report in office note.    LOS: 1 day   Oren Binet, MD  [facility]  If 7PM-7AM, please contact night-coverage  Please page via [email or web address]  Go to [email or web address] and use [place]'s universal password to access. If you do not have the password, please contact the hospital operator.  Locate the [facility] provider you are looking for under [facility] and page to a number that you can be directly reached. If you still have difficulty reaching the provider, please page the [facility] (Director on Call) for the Hospitalists listed on amion for assistance.  12/19/2018, 11:47 AM

## 2018-12-19 NOTE — Progress Notes (Signed)
New Admission Note: ? Arrival Method: Stretcher Mental Orientation: Alert and  Oriented X4 Telemetry: Box [id number] Assessment: Completed Skin: Refer to flowsheet IV: Right Antecubital  Pain: 0 Tubes: Peritoneal Dialysis Catheter Safety Measures: Safety Fall Prevention Plan discussed with patient. Admission: Completed 5 [place] Orientation: Patient has been orientated to the room, unit and the staff. Family: None at the bedside at this time.  Orders have been reviewed and are being implemented. Will continue to monitor the patient. Call light has been placed within reach and bed alarm has been activated.  ? Milagros Loll), RN  Phone Number: [phone]

## 2018-12-19 NOTE — Progress Notes (Signed)
Pharmacy Antibiotic Note  Janet Mitchell is a 53 y.o. female admitted on 12/18/2018 with sepsis.  Pharmacy has been consulted for cefepime and vancomycin dosing; consulted to place cefepime IP today. She has ESRD and is on PD. She is afebrile, WBC are normal. GPC growing in peritoneal Cx thus far.  Plan: Total vancomycin 2500 mg IV given 9/22. In 3-5 days when level drops < 15 mcg/ml, pharmacist following consult will redose. Goal trough 15-20 mcg/mL. Cefepime 625 mg/5 L  (125 mg/L) added to nightly PD bag D/C IV cefepime  Vancomycin random ordered for 9/25 F/U clinical progress and Cx/S  Height: 5\' 8"  (172.7 cm) Weight: 233 lb 11 oz (106 kg) IBW/kg (Calculated) : 63.9  Temp (24hrs), Avg:98.3 F (36.8 C), Min:98.1 F (36.7 C), Max:98.4 F (36.9 C)  Recent Labs  Lab 12/14/18 1630 12/18/18 0037 12/18/18 0352 12/19/18 0726  WBC 8.2 13.3*  --   --   CREATININE 8.44* 8.15* 8.67* 10.48*    Estimated Creatinine Clearance: 8.1 mL/min (A) (by C-G formula based on SCr of 10.48 mg/dL (H)).    No Known Allergies  Antimicrobials this admission: vancomycin 9/22 >>  cefepime 9/22 >>    Dose adjustments this admission:   Microbiology results: 9/22 BCx: ngtd 9/22 Peritoneal Cavity Culture: GPC 9/22 Covid 19: neg  Thank you for involving pharmacy in this patient's care.  Renold Genta, PharmD, BCPS Clinical Pharmacist Clinical phone for 12/19/2018 until 3p is [phone] 12/19/2018 8:36 AM  **Pharmacist phone directory can be found on [email or web address] listed under [place]**

## 2018-12-19 NOTE — Consult Note (Signed)
Reason for Consult: ESRD Referring Physician:  Dr. Heath Lark  Chief Complaint:  Abdominal pain  Assessment/Plan: 1. ESRD - will resume PD; she usually has 3 exchanges at night with 2.5% and the 4th fill is with icodextran which is emptied when she hooks up at night. 2. Peritonitis - already received IV vanc and cefepime; will continue the vanco + cefepime IP; will discontinue IV cefepime. Will also f/u culture results -> still pending 3. Renal osteodystrophy - will check a phos for management 4. Anemia - @ goal  5. DM  6. HTN    HPI: Janet Mitchell is an 53 y.o. female  ESRD on peritoneal dialysis followed by Dr. Carson Myrtle, hypertension, type 2 diabetes mellitus presenting with complaints of abdominal pain x1 week associated with nausea, vomiting and diarrhea.  Patient says that she would move her bowels every time she eats.  And also complained of crampy abdominal pain.  Patient is on peritoneal dialysis and dialyzed last night.  She did note that dialysate was cloudy in appearance.  She denies any fever or chills/ CP/ dyspnea.  Denies chest pain or shortness of breath, cough. Peritoneal fluid revealed 8400 neutrophils/ mm3 and cultures were sent off as well; she was then empirically started on vancomycin and cefepime.  ROS Pertinent items are noted in HPI.  Chemistry and CBC: Creatinine, Ser  Date/Time Value Ref Range Status  12/18/2018 03:52 AM 8.67 (H) 0.44 - 1.00 mg/dL Final  12/18/2018 12:37 AM 8.15 (H) 0.44 - 1.00 mg/dL Final  12/14/2018 04:30 PM 8.44 (H) 0.44 - 1.00 mg/dL Final  07/31/2018 03:00 PM 15.08 (H) 0.44 - 1.00 mg/dL Final  03/26/2018 05:42 AM 15.46 (H) 0.44 - 1.00 mg/dL Final  03/25/2018 02:57 AM 16.28 (H) 0.44 - 1.00 mg/dL Final  03/24/2018 07:46 PM 16.08 (H) 0.44 - 1.00 mg/dL Final  03/11/2018 05:44 AM 14.88 (H) 0.44 - 1.00 mg/dL Final  03/10/2018 11:29 PM 14.44 (H) 0.44 - 1.00 mg/dL Final  03/10/2018 07:20 AM 13.31 (H) 0.44 - 1.00 mg/dL Final  02/23/2018  08:27 PM 14.00 (H) 0.44 - 1.00 mg/dL Final  11/09/2017 03:13 AM 13.18 (H) 0.44 - 1.00 mg/dL Final  11/08/2017 04:25 AM 14.12 (H) 0.44 - 1.00 mg/dL Final  11/07/2017 10:40 AM 14.63 (H) 0.44 - 1.00 mg/dL Final  11/06/2017 08:29 PM 13.93 (H) 0.44 - 1.00 mg/dL Final   Recent Labs  Lab 12/14/18 1630 12/18/18 0037 12/18/18 0352  NA 130* 130* 131*  K 2.8* 2.7* 3.9  CL 89* 87* 90*  CO2 28 27 28   GLUCOSE 205* 188* 184*  BUN 33* 40* 41*  CREATININE 8.44* 8.15* 8.67*  CALCIUM 8.6* 8.4* 8.1*   Recent Labs  Lab 12/14/18 1630 12/18/18 0037  WBC 8.2 13.3*  HGB 10.6* 11.3*  HCT 34.3* 37.8  MCV 76.1* 75.0*  PLT 239 250   Liver Function Tests: Recent Labs  Lab 12/14/18 1630 12/18/18 0037 12/18/18 0352  AST 14* 13* 14*  ALT 29 17 14   ALKPHOS 137* 112 106  BILITOT 0.6 0.5 0.7  PROT 7.2 8.1 7.1  ALBUMIN 2.4* 2.6* 2.3*   Recent Labs  Lab 12/14/18 1630 12/18/18 0037  LIPASE 24 19   No results for input(s): AMMONIA in the last 168 hours. Cardiac Enzymes: No results for input(s): CKTOTAL, CKMB, CKMBINDEX, TROPONINI in the last 168 hours. Iron Studies: No results for input(s): IRON, TIBC, TRANSFERRIN, FERRITIN in the last 72 hours. PT/INR: @LABRCNTIP (inr:5)  Xrays/Other Studies: ) Results for orders placed or performed  during the hospital encounter of 12/18/18 (from the past 48 hour(s))  CBC     Status: Abnormal   Collection Time: 12/18/18 12:37 AM  Result Value Ref Range   WBC 13.3 (H) 4.0 - 10.5 K/uL   RBC 5.04 3.87 - 5.11 MIL/uL   Hemoglobin 11.3 (L) 12.0 - 15.0 g/dL   HCT 37.8 36.0 - 46.0 %   MCV 75.0 (L) 80.0 - 100.0 fL   MCH 22.4 (L) 26.0 - 34.0 pg   MCHC 29.9 (L) 30.0 - 36.0 g/dL   RDW 19.9 (H) 11.5 - 15.5 %   Platelets 250 150 - 400 K/uL   nRBC 0.0 0.0 - 0.2 %    Comment: Performed at [facility], [address], [place], [place] [address]  Comprehensive metabolic panel     Status: Abnormal   Collection Time: 12/18/18 12:37 AM  Result Value Ref Range   Sodium  130 (L) 135 - 145 mmol/L   Potassium 2.7 (LL) 3.5 - 5.1 mmol/L    Comment: CRITICAL RESULT CALLED TO, READ BACK BY AND VERIFIED WITH: M DOSS,RN @0132  12/18/18 MKELLY    Chloride 87 (L) 98 - 111 mmol/L   CO2 27 22 - 32 mmol/L   Glucose, Bld 188 (H) 70 - 99 mg/dL   BUN 40 (H) 6 - 20 mg/dL   Creatinine, Ser 8.15 (H) 0.44 - 1.00 mg/dL   Calcium 8.4 (L) 8.9 - 10.3 mg/dL   Total Protein 8.1 6.5 - 8.1 g/dL   Albumin 2.6 (L) 3.5 - 5.0 g/dL   AST 13 (L) 15 - 41 U/L   ALT 17 0 - 44 U/L   Alkaline Phosphatase 112 38 - 126 U/L   Total Bilirubin 0.5 0.3 - 1.2 mg/dL   GFR calc non Af Amer 5 (L) >60 mL/min   GFR calc Af Amer 6 (L) >60 mL/min   Anion gap 16 (H) 5 - 15    Comment: Performed at [facility], [address], [place], [place] [address]  Lipase, blood     Status: None   Collection Time: 12/18/18 12:37 AM  Result Value Ref Range   Lipase 19 11 - 51 U/L    Comment: Performed at [facility], [address] [place], [place] [address]  Lactate dehydrogenase (pleural or peritoneal fluid)     Status: Abnormal   Collection Time: 12/18/18  1:14 AM  Result Value Ref Range   LD, Fluid 42 (H) 3 - 23 U/L    Comment: (NOTE) Results should be evaluated in conjunction with serum values    Fluid Type-FLDH PERITONEAL CAVITY     Comment: Performed at [facility], [address], [place], [place] [address]  Glucose, pleural or peritoneal fluid     Status: None   Collection Time: 12/18/18  1:14 AM  Result Value Ref Range   Glucose, Fluid 182 mg/dL    Comment: (NOTE) No normal range established for this test Results should be evaluated in conjunction with serum values    Fluid Type-FGLU PERITONEAL CAVITY     Comment: Performed at [facility], [address], [place], [place] [address]  Protein, pleural or peritoneal fluid     Status: None   Collection Time: 12/18/18  1:14 AM  Result Value Ref Range   Total protein, fluid <3.0 g/dL   Fluid Type-FTP PERITONEAL CAVITY     Comment: Performed at  [facility], [address], [place], [place] [address]  Albumin, pleural or peritoneal fluid  Status: None   Collection Time: 12/18/18  1:14 AM  Result Value Ref Range   Albumin, Fluid <1.0 g/dL   Fluid Type-FALB PERITONEAL CAVITY     Comment: Performed at [facility], [address] [place], [place] [address]  Body fluid cell count with differential     Status: Abnormal   Collection Time: 12/18/18  1:14 AM  Result Value Ref Range   Fluid Type-FCT Peritoneal    Color, Fluid YELLOW (A) YELLOW   Appearance, Fluid CLOUDY (A) CLEAR   Total Nucleated Cell Count, Fluid 12,267 (H) 0 - 1,000 cu mm   Neutrophil Count, Fluid 69 (H) 0 - 25 %   Lymphs, Fluid 7 %   Monocyte-Macrophage-Serous Fluid 24 (L) 50 - 90 %    Comment: Performed at [facility], [address], [place], [place] [address]  Culture, body fluid-bottle     Status: None (Preliminary result)   Collection Time: 12/18/18  1:14 AM   Specimen: Peritoneal Cavity  Result Value Ref Range   Specimen Description PERITONEAL CAVITY    Special Requests NONE    Gram Stain      CYTOSPIN SMEAR WBC PRESENT,BOTH PMN AND MONONUCLEAR NO ORGANISMS SEEN    Culture      GRAM POSITIVE COCCI Gram Stain Report Called to,Read Back By and Verified With: DR. [facility] AT [id number] ON [id number] BY THOMPSON S. BOTH AEROBIC AND ANAEROBIC BOTTLES Performed at [facility], [address], [place], [place] [address]    Report Status PENDING   Blood culture (routine x 2)     Status: None (Preliminary result)   Collection Time: 12/18/18  3:42 AM   Specimen: BLOOD  Result Value Ref Range   Specimen Description BLOOD LEFT ANTECUBITAL    Special Requests      BOTTLES DRAWN AEROBIC AND ANAEROBIC Blood Culture adequate volume   Culture      NO GROWTH < 12 HOURS Performed at [facility], [address] [place], [place] [address]    Report Status PENDING   Blood culture (routine x 2)     Status: None (Preliminary result)   Collection Time: 12/18/18  3:52 AM    Specimen: BLOOD LEFT HAND  Result Value Ref Range   Specimen Description BLOOD LEFT HAND    Special Requests      BOTTLES DRAWN AEROBIC AND ANAEROBIC Blood Culture adequate volume   Culture      NO GROWTH < 12 HOURS Performed at [facility], [address], [place], [place] [address]    Report Status PENDING   Hemoglobin A1c     Status: Abnormal   Collection Time: 12/18/18  3:52 AM  Result Value Ref Range   Hgb A1c MFr Bld 6.7 (H) 4.8 - 5.6 %    Comment: (NOTE)         Prediabetes: 5.7 - 6.4         Diabetes: >6.4         Glycemic control for adults with diabetes: <7.0    Mean Plasma Glucose 146 mg/dL    Comment: (NOTE) Performed At: [facility] [place], [place] [id number] Rush Farmer MD BJ:[id number]   Comprehensive metabolic panel     Status: Abnormal   Collection Time: 12/18/18  3:52 AM  Result Value Ref Range   Sodium 131 (L) 135 - 145 mmol/L   Potassium 3.9 3.5 - 5.1 mmol/L    Comment: DELTA CHECK NOTED   Chloride 90 (L) 98 - 111  mmol/L   CO2 28 22 - 32 mmol/L   Glucose, Bld 184 (H) 70 - 99 mg/dL   BUN 41 (H) 6 - 20 mg/dL   Creatinine, Ser 8.67 (H) 0.44 - 1.00 mg/dL   Calcium 8.1 (L) 8.9 - 10.3 mg/dL   Total Protein 7.1 6.5 - 8.1 g/dL   Albumin 2.3 (L) 3.5 - 5.0 g/dL   AST 14 (L) 15 - 41 U/L   ALT 14 0 - 44 U/L   Alkaline Phosphatase 106 38 - 126 U/L   Total Bilirubin 0.7 0.3 - 1.2 mg/dL   GFR calc non Af Amer 5 (L) >60 mL/min   GFR calc Af Amer 6 (L) >60 mL/min   Anion gap 13 5 - 15    Comment: Performed at [facility], [address], [place], [place] [address]  SARS CORONAVIRUS 2 (TAT 6-24 HRS) Nasopharyngeal Nasopharyngeal Swab     Status: None   Collection Time: 12/18/18  4:42 AM   Specimen: Nasopharyngeal Swab  Result Value Ref Range   SARS Coronavirus 2 NEGATIVE NEGATIVE    Comment: (NOTE) SARS-CoV-2 target nucleic acids are NOT DETECTED. The SARS-CoV-2 RNA is generally detectable in upper and lower respiratory specimens  during the acute phase of infection. Negative results do not preclude SARS-CoV-2 infection, do not rule out co-infections with other pathogens, and should not be used as the sole basis for treatment or other patient management decisions. Negative results must be combined with clinical observations, patient history, and epidemiological information. The expected result is Negative. Fact Sheet for Patients: SugarRoll.be Fact Sheet for Healthcare Providers: https://www.woods-mathews.com/ This test is not yet approved or cleared by the [place] FDA and  has been authorized for detection and/or diagnosis of SARS-CoV-2 by FDA under an Emergency Use Authorization (EUA). This EUA will remain  in effect (meaning this test can be used) for the duration of the COVID-19 declaration under Section 56 4(b)(1) of the Act, 21 U.S.C. section 360bbb-3(b)(1), unless the authorization is terminated or revoked sooner. Performed at [facility], [place] [address], [place], [place] [address]   CBG monitoring, ED     Status: Abnormal   Collection Time: 12/18/18  5:40 AM  Result Value Ref Range   Glucose-Capillary 182 (H) 70 - 99 mg/dL  CBG monitoring, ED     Status: Abnormal   Collection Time: 12/18/18 12:03 PM  Result Value Ref Range   Glucose-Capillary 173 (H) 70 - 99 mg/dL  CBG monitoring, ED     Status: Abnormal   Collection Time: 12/18/18  6:04 PM  Result Value Ref Range   Glucose-Capillary 130 (H) 70 - 99 mg/dL  CBG monitoring, ED     Status: Abnormal   Collection Time: 12/18/18 10:48 PM  Result Value Ref Range   Glucose-Capillary 160 (H) 70 - 99 mg/dL  Glucose, capillary     Status: Abnormal   Collection Time: 12/19/18 12:26 AM  Result Value Ref Range   Glucose-Capillary 162 (H) 70 - 99 mg/dL  Glucose, capillary     Status: None   Collection Time: 12/19/18  5:52 AM  Result Value Ref Range   Glucose-Capillary 95 70 - 99 mg/dL   Dg Foot  Complete Right  Result Date: 12/17/2018 Please see detailed radiograph report in office note.   PMH:   Past Medical History:  Diagnosis Date  . Anemia   . CHF (congestive heart failure) ([place])   . Chronic cholecystitis with calculus   . ESRD (end  stage renal disease) on dialysis [facility])    "peritoneal dialysis q hs" (03/09/2018)  . Headache    "a few/wk" (03/09/2018)  . History of blood transfusion 10/2017   "low blood count" (03/09/2018)  . Hypertension   . Spinal headache   . Type II diabetes mellitus ([facility])     PSH:   Past Surgical History:  Procedure Laterality Date  . AMPUTATION TOE Left 2013   Great toe  . BIOPSY  10/24/2018   Procedure: BIOPSY;  Surgeon: Daneil Dolin, MD;  [facility];  Service: Endoscopy;;  right and left colon  . CATARACT EXTRACTION W/ INTRAOCULAR LENS IMPLANT Right   . CESAREAN SECTION  1994; 1998  . CHOLECYSTECTOMY N/A 03/09/2018   Procedure: LAPAROSCOPIC CHOLECYSTECTOMY WITH INTRAOPERATIVE CHOLANGIOGRAM ERAS PATHWAY;  Surgeon: Donnie Mesa, MD;  Location: [facility];  Service: General;  Laterality: N/A;  . COLONOSCOPY N/A 10/24/2018   Procedure: COLONOSCOPY;  Surgeon: Daneil Dolin, MD;  [facility];  Service: Endoscopy;  Laterality: N/A;  1:00pm  . EYE SURGERY  05/15/2018   Removal of blood in the globe (due to DM)  . FLEXIBLE SIGMOIDOSCOPY N/A 11/24/2017   Procedure: FLEXIBLE SIGMOIDOSCOPY;  Surgeon: Daneil Dolin, MD;  [facility];  Service: Endoscopy;  Laterality: N/A;  . LAPAROSCOPIC CHOLECYSTECTOMY  03/09/2018  . PERITONEAL CATHETER INSERTION  2017  . POLYPECTOMY  10/24/2018   Procedure: POLYPECTOMY;  Surgeon: Daneil Dolin, MD;  [facility];  Service: Endoscopy;;  . TOTAL HIP ARTHROPLASTY Left 1997    Allergies: No Known Allergies  Medications:   Prior to Admission medications   Medication Sig Start Date End Date Taking? Authorizing Provider  ALPRAZolam Duanne Moron) 0.5 MG tablet Take 0.5  mg by mouth at bedtime as needed for anxiety.   Yes [provider]  calcitRIOL (ROCALTROL) 0.5 MCG capsule Take 0.5-1 mcg by mouth See admin instructions. 0.51mcg on Mondays through Fridays and take 38mcg on Saturdays and Sundays   Yes [provider]  Crisaborole (EUCRISA) 2 % OINT Apply 1 application topically daily as needed (for rash/irritation).   Yes [provider]  doxazosin (CARDURA) 8 MG tablet Take 8 mg by mouth at bedtime.   Yes [provider]  Insulin Glargine (TOUJEO MAX SOLOSTAR) 300 UNIT/ML SOPN Inject 14 Units into the skin at bedtime. 11/09/17  Yes Emokpae, Courage, MD  labetalol (NORMODYNE) 200 MG tablet Take 200 mg by mouth 2 (two) times daily.   Yes [provider]  mupirocin ointment (BACTROBAN) 2 % Apply to right foot ulcer once daily and cover with dressing. 12/17/18 12/31/18 Yes [place], Stephani Police, DPM  amLODipine (NORVASC) 10 MG tablet Take 1 tablet (10 mg total) by mouth daily. Patient not taking: Reported on 12/18/2018 11/09/17   Roxan Hockey, MD    Discontinued Meds:   Medications Discontinued During This Encounter  Medication Reason  . calcitRIOL (ROCALTROL) capsule 0.5-1 mcg   . amLODipine (NORVASC) tablet 10 mg Patient has not taken in last 30 days    Social History:  reports that she is a non-smoker but has been exposed to tobacco smoke. She has never used smokeless tobacco. She reports that she does not drink alcohol or use drugs.  Family History:   Family History  Problem Relation Age of Onset  . Heart disease Mother   . Thrombocytopenia Mother        TTP  . Heart failure Father   . Kidney disease Paternal  Grandfather   . Colon cancer Neg Hx     Blood pressure 137/79, pulse 70, temperature 98.4 F (36.9 C), temperature source Oral, resp. rate 18, height 5\' 8"  (1.727 m), weight 106 kg, SpO2 95 %. General appearance: alert, cooperative and appears stated age Head: Normocephalic, without obvious  abnormality, atraumatic Eyes: negative Neck: no adenopathy, no carotid bruit, no JVD, supple, symmetrical, trachea midline and thyroid not enlarged, symmetric, no tenderness/mass/nodules Back: symmetric, no curvature. ROM normal. No CVA tenderness. Resp: clear to auscultation bilaterally Chest wall: no tenderness Cardio: S1, S2 normal GI: Suprisingly not very tender, no rebound Extremities: edema tr Pulses: 2+ and symmetric Skin: Skin color, texture, turgor normal. No rashes or lesions Lymph nodes: Cervical adenopathy: none       Selenia Mihok, Hunt Oris, MD 12/19/2018, 7:31 AM

## 2018-12-19 NOTE — Plan of Care (Signed)
  Problem: Education: Goal: Knowledge of General Education information will improve Description: Including pain rating scale, medication(s)/side effects and non-pharmacologic comfort measures Outcome: Progressing   Problem: Pain Managment: Goal: General experience of comfort will improve Outcome: Progressing   

## 2018-12-20 LAB — CULTURE, BODY FLUID W GRAM STAIN -BOTTLE

## 2018-12-20 LAB — GLUCOSE, CAPILLARY
Glucose-Capillary: 154 mg/dL — ABNORMAL HIGH (ref 70–99)
Glucose-Capillary: 200 mg/dL — ABNORMAL HIGH (ref 70–99)
Glucose-Capillary: 238 mg/dL — ABNORMAL HIGH (ref 70–99)

## 2018-12-20 LAB — BODY FLUID CELL COUNT WITH DIFFERENTIAL
Lymphs, Fluid: 12 %
Monocyte-Macrophage-Serous Fluid: 23 % — ABNORMAL LOW (ref 50–90)
Neutrophil Count, Fluid: 65 % — ABNORMAL HIGH (ref 0–25)
Total Nucleated Cell Count, Fluid: 14000 cu mm — ABNORMAL HIGH (ref 0–1000)

## 2018-12-20 LAB — PATHOLOGIST SMEAR REVIEW

## 2018-12-20 MED ORDER — OXYCODONE-ACETAMINOPHEN 5-325 MG PO TABS
2.0000 | ORAL_TABLET | Freq: Once | ORAL | Status: AC
  Administered 2018-12-20: 2 via ORAL
  Filled 2018-12-20: qty 2

## 2018-12-20 MED ORDER — CEFAZOLIN SODIUM-DEXTROSE 1-4 GM/50ML-% IV SOLN
1.0000 g | Freq: Once | INTRAVENOUS | Status: AC
  Administered 2018-12-20: 1 g via INTRAVENOUS
  Filled 2018-12-20: qty 50

## 2018-12-20 MED ORDER — DELFLEX-LC/2.5% DEXTROSE 394 MOSM/L IP SOLN
INTRAPERITONEAL | Status: DC
  Filled 2018-12-20: qty 5000

## 2018-12-20 MED ORDER — HEPARIN SODIUM (PORCINE) 1000 UNIT/ML IJ SOLN
INTRAPERITONEAL | Status: DC | PRN
  Filled 2018-12-20: qty 5000

## 2018-12-20 MED ORDER — DELFLEX-LC/2.5% DEXTROSE 394 MOSM/L IP SOLN: INTRAPERITONEAL | Status: DC

## 2018-12-20 MED ORDER — HEPARIN SODIUM (PORCINE) 1000 UNIT/ML IJ SOLN
INTRAPERITONEAL | Status: DC | PRN
  Filled 2018-12-20 (×2): qty 5000

## 2018-12-20 NOTE — Discharge Summary (Signed)
PATIENT DETAILS Name: Janet Mitchell Age: 53 y.o. Sex: female Date of Birth: February 05, 1966 MRN: [id number]. Admitting Physician: Vianne Bulls, MD INO:MVEHM, Myra Rude, MD  Admit Date: 12/18/2018 Discharge date: 12/20/2018  Recommendations for Outpatient Follow-up:  1. Follow up with PCP in 1-2 weeks 2. Please obtain BMP/CBC in one week 3. Please ensure follow-up with patient's dialysis center for intraperitoneal antibiotics instruction (patient already aware of the upcoming appointment tomorrow)  Admitted From:  Home  Disposition: [place]: No  Equipment/Devices: None  Discharge Condition: Stable  CODE STATUS: FULL CODE  Diet recommendation:  Diet Order            Diet - low sodium heart healthy        Diet renal/carb modified with fluid restriction Diet-HS Snack? Nothing; Fluid restriction: 1200 mL Fluid; Room service appropriate? Yes; Fluid consistency: Thin  Diet effective now               Brief Summary: See H&P, Labs, Consult and Test reports for all details in brief, Patient is a 53 y.o. female with history of ESRD on PD-presented with abdominal pain-found to have peritonitis.  See below for further details  Brief Hospital Course: Peritoneal dialysis associated peritonitis: Much improved-abdominal pain and peritonitis have completely resolved.  Managed initially with vancomycin/cefepime-after peritoneal fluid culture came back positive for Streptococcus mitis-transition to Ancef.  Spoke with the nephrologist-Dr. Marko Stai give 1 dose of IV Ancef today-he will call patient's dialysis center and subsequently they will then get the patient to the center tomorrow for intraperitoneal antimicrobial therapy teaching/instruction.   Hyponatremia: Probably related to volume issues with renal failure-should improve with ongoing peritoneal dialysis.    Please continue to replete electrolytes in the outpatient setting..  Hypokalemia: Repleted-follow closely in  the outpatient setting.  Insulin independent DM-2: CBG stable with 14 units of Lantus daily and SSI.  HTN: Blood pressure controlled-continue Cardura, labetalol and amlodipine  ESRD: On PD-Per nephrology-patient to resume outpatient follow-up.  Obesity: Estimated body mass index is 35.53 kg/m as calculated from the following:   Height as of this encounter: 5\' 8"  (1.727 m).   Weight as of this encounter: 106 kg.   Procedures/Studies: None  Discharge Diagnoses:  Peritoneal dialysis catheter associated peritonitis ESRD HTN DM-2  Discharge Instructions:  Activity:  As tolerated   Discharge Instructions    Call MD for:  difficulty breathing, headache or visual disturbances   Complete by: As directed    Call MD for:  persistant nausea and vomiting   Complete by: As directed    Call MD for:  severe uncontrolled pain   Complete by: As directed    Diet - low sodium heart healthy   Complete by: As directed    Discharge instructions   Complete by: As directed    Follow with Primary MD  Lucianne Lei, MD in 1-2 weeks  Follow-up with your dialysis center on 9/25 for further instructions regarding antibiotic therapy to be given intraperitoneally.  Please ask your MD at the dialysis center to repeat blood work (electrolytes) tomorrow or day after tomorrow.  Please get a complete blood count and chemistry panel checked by your Primary MD at your next visit, and again as instructed by your Primary MD.  Get Medicines reviewed and adjusted: Please take all your medications with you for your next visit with your Primary MD  Laboratory/radiological data: Please request your Primary MD to go over all hospital tests and procedure/radiological results at  the follow up, please ask your Primary MD to get all Hospital records sent to his/her office.  In some cases, they will be blood work, cultures and biopsy results pending at the time of your discharge. Please request that your primary  care M.D. follows up on these results.  Also Note the following: If you experience worsening of your admission symptoms, develop shortness of breath, life threatening emergency, suicidal or homicidal thoughts you must seek medical attention immediately by calling 911 or calling your MD immediately  if symptoms less severe.  You must read complete instructions/literature along with all the possible adverse reactions/side effects for all the Medicines you take and that have been prescribed to you. Take any new Medicines after you have completely understood and accpet all the possible adverse reactions/side effects.   Do not drive when taking Pain medications or sleeping medications (Benzodaizepines)  Do not take more than prescribed Pain, Sleep and Anxiety Medications. It is not advisable to combine anxiety,sleep and pain medications without talking with your primary care practitioner  Special Instructions: If you have smoked or chewed Tobacco  in the last 2 yrs please stop smoking, stop any regular Alcohol  and or any Recreational drug use.  Wear Seat belts while driving.  Please note: You were cared for by a hospitalist during your hospital stay. Once you are discharged, your primary care physician will handle any further medical issues. Please note that NO REFILLS for any discharge medications will be authorized once you are discharged, as it is imperative that you return to your primary care physician (or establish a relationship with a primary care physician if you do not have one) for your post hospital discharge needs so that they can reassess your need for medications and monitor your lab values.   Increase activity slowly   Complete by: As directed      Allergies as of 12/20/2018   No Known Allergies     Medication List    TAKE these medications   ALPRAZolam 0.5 MG tablet Commonly known as: XANAX Take 0.5 mg by mouth at bedtime as needed for anxiety.   amLODipine 10 MG tablet  Commonly known as: NORVASC Take 1 tablet (10 mg total) by mouth daily.   calcitRIOL 0.5 MCG capsule Commonly known as: ROCALTROL Take 0.5-1 mcg by mouth See admin instructions. 0.57mcg on Mondays through Fridays and take 6mcg on Saturdays and Sundays   doxazosin 8 MG tablet Commonly known as: CARDURA Take 8 mg by mouth at bedtime.   Eucrisa 2 % Oint Generic drug: Crisaborole Apply 1 application topically daily as needed (for rash/irritation).   Insulin Glargine 300 UNIT/ML Sopn Commonly known as: Toujeo Max SoloStar Inject 14 Units into the skin at bedtime.   labetalol 200 MG tablet Commonly known as: NORMODYNE Take 200 mg by mouth 2 (two) times daily.   mupirocin ointment 2 % Commonly known as: BACTROBAN Apply to right foot ulcer once daily and cover with dressing.       No Known Allergies  Consultations:   nephrology   Other Procedures/Studies: Dg Foot Complete Right  Result Date: 12/17/2018 Please see detailed radiograph report in office note.    TODAY-DAY OF DISCHARGE:  Subjective:   Janet Mitchell today has no headache,no chest abdominal pain,no new weakness tingling or numbness, feels much better wants to go home today.   Objective:   Blood pressure (!) 178/81, pulse 71, temperature 97.8 F (36.6 C), temperature source Oral, resp. rate 18, height  5\' 8"  (1.727 m), weight 102.2 kg, SpO2 95 %.  Intake/Output Summary (Last 24 hours) at 12/20/2018 1336 Last data filed at 12/20/2018 0930 Gross per 24 hour  Intake 8640.63 ml  Output 8450 ml  Net 190.63 ml   Filed Weights   12/18/18 0025 12/18/18 2356 12/19/18 2008  Weight: 104.3 kg 106 kg 102.2 kg    Exam: Awake Alert, Oriented *3, No new F.N deficits, Normal affect [place].AT,PERRAL Supple Neck,No JVD, No cervical lymphadenopathy appriciated.  Symmetrical Chest wall movement, Good air movement bilaterally, CTAB RRR,No Gallops,Rubs or new Murmurs, No Parasternal Heave +ve B.Sounds, Abd Soft, Non  tender, No organomegaly appriciated, No rebound -guarding or rigidity. No Cyanosis, Clubbing or edema, No new Rash or bruise   PERTINENT RADIOLOGIC STUDIES: Dg Foot Complete Right  Result Date: 12/17/2018 Please see detailed radiograph report in office note.    PERTINENT LAB RESULTS: CBC: Recent Labs    12/18/18 0037 12/19/18 0726  WBC 13.3* 6.6  HGB 11.3* 9.6*  HCT 37.8 30.5*  PLT 250 239   CMET CMP     Component Value Date/Time   NA 128 (L) 12/19/2018 0726   K 2.8 (L) 12/19/2018 0726   CL 87 (L) 12/19/2018 0726   CO2 27 12/19/2018 0726   GLUCOSE 95 12/19/2018 0726   BUN 47 (H) 12/19/2018 0726   CREATININE 10.48 (H) 12/19/2018 0726   CALCIUM 8.1 (L) 12/19/2018 0726   PROT 7.1 12/18/2018 0352   ALBUMIN 1.8 (L) 12/19/2018 0726   AST 14 (L) 12/18/2018 0352   ALT 14 12/18/2018 0352   ALKPHOS 106 12/18/2018 0352   BILITOT 0.7 12/18/2018 0352   GFRNONAA 4 (L) 12/19/2018 0726   GFRAA 4 (L) 12/19/2018 0726    GFR Estimated Creatinine Clearance: 7.9 mL/min (A) (by C-G formula based on SCr of 10.48 mg/dL (H)). Recent Labs    12/18/18 0037  LIPASE 19   No results for input(s): CKTOTAL, CKMB, CKMBINDEX, TROPONINI in the last 72 hours. Invalid input(s): POCBNP No results for input(s): DDIMER in the last 72 hours. Recent Labs    12/18/18 0352  HGBA1C 6.7*   No results for input(s): CHOL, HDL, LDLCALC, TRIG, CHOLHDL, LDLDIRECT in the last 72 hours. No results for input(s): TSH, T4TOTAL, T3FREE, THYROIDAB in the last 72 hours.  Invalid input(s): FREET3 No results for input(s): VITAMINB12, FOLATE, FERRITIN, TIBC, IRON, RETICCTPCT in the last 72 hours. Coags: No results for input(s): INR in the last 72 hours.  Invalid input(s): PT Microbiology: Recent Results (from the past 240 hour(s))  Culture, body fluid-bottle     Status: Abnormal   Collection Time: 12/18/18  1:14 AM   Specimen: Peritoneal Cavity  Result Value Ref Range Status   Specimen Description    Final    PERITONEAL CAVITY Performed at [facility], [address], [place], [place] [address]    Special Requests   Final    BOTTLES DRAWN AEROBIC AND ANAEROBIC 10CC Performed at [facility], [address], [place], [place] [address]    Gram Stain   Final    CYTOSPIN SMEAR WBC PRESENT,BOTH PMN AND MONONUCLEAR NO ORGANISMS SEEN Performed at [facility], [address], [place], [place] [address]    Culture (A)  Final    STREPTOCOCCUS MITIS/ORALIS Gram Stain Report Called to,Read Back By and Verified With: DR. [facility] AT [id number] ON [id number] BY THOMPSON S. BOTH AEROBIC AND ANAEROBIC BOTTLES Performed at [facility], [place] [address], [place], [place] [address]  Report Status 12/20/2018 FINAL  Final   Organism ID, Bacteria STREPTOCOCCUS MITIS/ORALIS  Final      Susceptibility   Streptococcus mitis/oralis - MIC*    TETRACYCLINE 0.5 SENSITIVE Sensitive     VANCOMYCIN 0.5 SENSITIVE Sensitive     CLINDAMYCIN <=0.25 SENSITIVE Sensitive     CEFTRIAXONE Value in next row Sensitive      SENSITIVE<=0.12    PENICILLIN Value in next row Sensitive      SENSITIVE<=0.06    AMPICILLIN Value in next row Sensitive      SENSITIVE<=0.06    CEFOTAXIME (Non-meningitis) Value in next row Sensitive      SENSITIVE<=0.06    LEVOFLOXACIN Value in next row Sensitive      SENSITIVE<=0.06    MOXIFLOXACIN Value in next row Sensitive      SENSITIVE<=0.06    LINEZOLID Value in next row Sensitive      SENSITIVE<=0.06    TIGECYCLINE Value in next row Sensitive      SENSITIVE<=0.06    * STREPTOCOCCUS MITIS/ORALIS  Blood culture (routine x 2)     Status: None (Preliminary result)   Collection Time: 12/18/18  3:42 AM   Specimen: BLOOD  Result Value Ref Range Status   Specimen Description BLOOD LEFT ANTECUBITAL  Final   Special Requests   Final    BOTTLES DRAWN AEROBIC AND ANAEROBIC Blood Culture adequate volume   Culture   Final    NO GROWTH 2 DAYS Performed at [facility], [address],  [place], [place] [address]    Report Status PENDING  Incomplete  Blood culture (routine x 2)     Status: None (Preliminary result)   Collection Time: 12/18/18  3:52 AM   Specimen: BLOOD LEFT HAND  Result Value Ref Range Status   Specimen Description BLOOD LEFT HAND  Final   Special Requests   Final    BOTTLES DRAWN AEROBIC AND ANAEROBIC Blood Culture adequate volume   Culture   Final    NO GROWTH 2 DAYS Performed at [facility], [address] [place], [place] [address]    Report Status PENDING  Incomplete  SARS CORONAVIRUS 2 (TAT 6-24 HRS) Nasopharyngeal Nasopharyngeal Swab     Status: None   Collection Time: 12/18/18  4:42 AM   Specimen: Nasopharyngeal Swab  Result Value Ref Range Status   SARS Coronavirus 2 NEGATIVE NEGATIVE Final    Comment: (NOTE) SARS-CoV-2 target nucleic acids are NOT DETECTED. The SARS-CoV-2 RNA is generally detectable in upper and lower respiratory specimens during the acute phase of infection. Negative results do not preclude SARS-CoV-2 infection, do not rule out co-infections with other pathogens, and should not be used as the sole basis for treatment or other patient management decisions. Negative results must be combined with clinical observations, patient history, and epidemiological information. The expected result is Negative. Fact Sheet for Patients: SugarRoll.be Fact Sheet for Healthcare Providers: https://www.woods-mathews.com/ This test is not yet approved or cleared by the [place] FDA and  has been authorized for detection and/or diagnosis of SARS-CoV-2 by FDA under an Emergency Use Authorization (EUA). This EUA will remain  in effect (meaning this test can be used) for the duration of the COVID-19 declaration under Section 56 4(b)(1) of the Act, 21 U.S.C. section 360bbb-3(b)(1), unless the authorization is terminated or revoked sooner. Performed at [facility], [place] [address],  [place], [place] [address]     FURTHER DISCHARGE INSTRUCTIONS:  Get Medicines reviewed and adjusted: Please take all your  medications with you for your next visit with your Primary MD  Laboratory/radiological data: Please request your Primary MD to go over all hospital tests and procedure/radiological results at the follow up, please ask your Primary MD to get all Hospital records sent to his/her office.  In some cases, they will be blood work, cultures and biopsy results pending at the time of your discharge. Please request that your primary care M.D. goes through all the records of your hospital data and follows up on these results.  Also Note the following: If you experience worsening of your admission symptoms, develop shortness of breath, life threatening emergency, suicidal or homicidal thoughts you must seek medical attention immediately by calling 911 or calling your MD immediately  if symptoms less severe.  You must read complete instructions/literature along with all the possible adverse reactions/side effects for all the Medicines you take and that have been prescribed to you. Take any new Medicines after you have completely understood and accpet all the possible adverse reactions/side effects.   Do not drive when taking Pain medications or sleeping medications (Benzodaizepines)  Do not take more than prescribed Pain, Sleep and Anxiety Medications. It is not advisable to combine anxiety,sleep and pain medications without talking with your primary care practitioner  Special Instructions: If you have smoked or chewed Tobacco  in the last 2 yrs please stop smoking, stop any regular Alcohol  and or any Recreational drug use.  Wear Seat belts while driving.  Please note: You were cared for by a hospitalist during your hospital stay. Once you are discharged, your primary care physician will handle any further medical issues. Please note that NO REFILLS for any discharge medications will  be authorized once you are discharged, as it is imperative that you return to your primary care physician (or establish a relationship with a primary care physician if you do not have one) for your post hospital discharge needs so that they can reassess your need for medications and monitor your lab values.  Total Time spent coordinating discharge including counseling, education and face to face time equals 35 minutes.  SignedOren Binet 12/20/2018 1:36 PM

## 2018-12-20 NOTE — Progress Notes (Signed)
Patient had been given discharge instructions and verbalizes understanding of all instructions. Patient escorted out via wheelchair by nursing to POV.

## 2018-12-20 NOTE — Progress Notes (Addendum)
[facility] Progress Note   53 y.o. female  ESRD on peritoneal dialysis followed by Dr. Carson Myrtle, hypertension, type 2 diabetes mellitus presenting with complaints of abdominal pain x1 week associated with nausea, vomiting and diarrhea. Patient says that she would move her bowels every time she eats. And also complained of crampy abdominal pain. Patient is on peritoneal dialysis and dialyzed last night. She did note that dialysate was cloudy in appearance. She denies any fever or chills/ CP/ dyspnea. Denies chest pain or shortness of breath, cough. Peritoneal fluid revealed 8400 neutrophils/ mm3 and cultures were sent off as well; she was then empirically started on vancomycin and cefepime.  Assessment/ Plan:   1. ESRD - will resume PD; she usually has 3 exchanges at night with 2.5% and the 4th fill is with icodextran which is emptied when she hooks up at night. 2. Peritonitis - already received IV vanc and cefepime  Report Status 12/20/2018 FINAL   Organism ID, Bacteria STREPTOCOCCUS MITIS/ORALIS   Resulting Agency [facility]  Susceptibility   Streptococcus mitis/oralis    MIC    CEFTRIAXONE  Sensitive1    CLINDAMYCIN <=0.25 SENS... Sensitive    PENICILLIN  Sensitive2    TETRACYCLINE 0.5 SENSITIVE  Sensitive    VANCOMYCIN 0.5 SENSITIVE  Sensitive         1 (Streptococcus mitis/oralis/CEFTRIAXONE)   SENSITIVE  <=0.12  2 (Streptococcus mitis/oralis/PENICILLIN)   SENSITIVE  <=0.06      Outpatient dialysis only has Vancomycin, Fortaz and Ancef. Therefore, she would only be able to get IP Vancomycin at the outpt center. Her outpt center is [phone] and they are not open to 9am so will call them again later in the morning.  Will stop the Cefepime; upon d/c she will receive 1500mg  Vanc IP. I will need to confirm with her unit that they are able to do this per their protocol.  I will also repeat cell count for today; initial count was 8400 neutrophils /  mm3.  Will also check a random vanco level; 2gm given on 9/22.Marland Kitchen  Seen on PD; currently on last drain. Peritonitis pain much improved. Fluid clearer as well.   3. Renal osteodystrophy - will check a phos for management 4. Anemia - @ goal  5. DM  6. HTN  Subjective:   Feeling better but abdominal bloating from diet.  No f/c/n/v/dyspnea   Objective:   BP (!) 184/86 (BP Location: Left Arm)   Pulse 72   Temp 98.3 F (36.8 C)   Resp 18   Ht 5\' 8"  (1.727 m)   Wt 102.2 kg   SpO2 96%   BMI 34.26 kg/m   Intake/Output Summary (Last 24 hours) at 12/20/2018 0810 Last data filed at 12/20/2018 0600 Gross per 24 hour  Intake 850.84 ml  Output 0 ml  Net 850.84 ml   Weight change: -3.8 kg  Physical Exam: GEN: NAD, A&Ox3, NCAT HEENT: No conjunctival pallor, EOMI NECK: Supple, no thyromegaly LUNGS: CTA B/L no rales, rhonchi or wheezing CV: RRR, No M/R/G ABD: SND Mild tenderness but no rebound EXT: No lower extremity edema    Imaging: No results found.  Labs: BMET Recent Labs  Lab 12/14/18 1630 12/18/18 0037 12/18/18 0352 12/19/18 0726  NA 130* 130* 131* 128*  K 2.8* 2.7* 3.9 2.8*  CL 89* 87* 90* 87*  CO2 28 27 28 27   GLUCOSE 205* 188* 184* 95  BUN 33* 40* 41* 47*  CREATININE 8.44* 8.15* 8.67* 10.48*  CALCIUM  8.6* 8.4* 8.1* 8.1*  PHOS  --   --   --  6.0*   CBC Recent Labs  Lab 12/14/18 1630 12/18/18 0037 12/19/18 0726  WBC 8.2 13.3* 6.6  HGB 10.6* 11.3* 9.6*  HCT 34.3* 37.8 30.5*  MCV 76.1* 75.0* 73.8*  PLT 239 250 239    Medications:    . calcitRIOL  0.5 mcg Oral Once per day on Mon Tue Wed Thu Fri  . [START ON 12/22/2018] calcitRIOL  1 mcg Oral Once per day on Sun Sat  . doxazosin  8 mg Oral QHS  . gentamicin cream  1 application Topical Daily  . heparin  5,000 Units Subcutaneous Q8H  . insulin aspart  0-9 Units Subcutaneous Q6H  . insulin glargine  14 Units Subcutaneous QHS  . labetalol  200 mg Oral BID  . oxyCODONE-acetaminophen  2 tablet Oral  Once  . polyethylene glycol  17 g Oral Daily      Otelia Santee, MD 12/20/2018, 8:10 AM

## 2018-12-20 NOTE — Plan of Care (Signed)
  Problem: Education: Goal: Knowledge of General Education information will improve Description Including pain rating scale, medication(s)/side effects and non-pharmacologic comfort measures Outcome: Progressing   Problem: Health Behavior/Discharge Planning: Goal: Ability to manage health-related needs will improve Outcome: Progressing   

## 2018-12-21 DIAGNOSIS — N186 End stage renal disease: Secondary | ICD-10-CM | POA: Diagnosis not present

## 2018-12-21 DIAGNOSIS — D631 Anemia in chronic kidney disease: Secondary | ICD-10-CM | POA: Diagnosis not present

## 2018-12-21 DIAGNOSIS — Z992 Dependence on renal dialysis: Secondary | ICD-10-CM | POA: Diagnosis not present

## 2018-12-21 DIAGNOSIS — D509 Iron deficiency anemia, unspecified: Secondary | ICD-10-CM | POA: Diagnosis not present

## 2018-12-22 DIAGNOSIS — D509 Iron deficiency anemia, unspecified: Secondary | ICD-10-CM | POA: Diagnosis not present

## 2018-12-22 DIAGNOSIS — Z992 Dependence on renal dialysis: Secondary | ICD-10-CM | POA: Diagnosis not present

## 2018-12-22 DIAGNOSIS — D631 Anemia in chronic kidney disease: Secondary | ICD-10-CM | POA: Diagnosis not present

## 2018-12-22 DIAGNOSIS — N186 End stage renal disease: Secondary | ICD-10-CM | POA: Diagnosis not present

## 2018-12-23 DIAGNOSIS — D509 Iron deficiency anemia, unspecified: Secondary | ICD-10-CM | POA: Diagnosis not present

## 2018-12-23 DIAGNOSIS — Z992 Dependence on renal dialysis: Secondary | ICD-10-CM | POA: Diagnosis not present

## 2018-12-23 DIAGNOSIS — D631 Anemia in chronic kidney disease: Secondary | ICD-10-CM | POA: Diagnosis not present

## 2018-12-23 DIAGNOSIS — N186 End stage renal disease: Secondary | ICD-10-CM | POA: Diagnosis not present

## 2018-12-23 LAB — CULTURE, BLOOD (ROUTINE X 2)
Culture: NO GROWTH
Culture: NO GROWTH
Special Requests: ADEQUATE
Special Requests: ADEQUATE

## 2018-12-24 ENCOUNTER — Telehealth: Payer: Self-pay

## 2018-12-24 DIAGNOSIS — D509 Iron deficiency anemia, unspecified: Secondary | ICD-10-CM | POA: Diagnosis not present

## 2018-12-24 DIAGNOSIS — D631 Anemia in chronic kidney disease: Secondary | ICD-10-CM | POA: Diagnosis not present

## 2018-12-24 DIAGNOSIS — N186 End stage renal disease: Secondary | ICD-10-CM | POA: Diagnosis not present

## 2018-12-24 DIAGNOSIS — Z992 Dependence on renal dialysis: Secondary | ICD-10-CM | POA: Diagnosis not present

## 2018-12-24 NOTE — Telephone Encounter (Signed)
Message sent to nephrology. Waiting on a response from pt's provider.

## 2018-12-24 NOTE — Telephone Encounter (Signed)
Dr. Lowanda Foster, our office is reaching out to see if  Levsin would be ok in the setting of end stage renal disease on hemodialysis? We are trying to help her with abdominal pain and diarrhea. We have tried colestipol and bentyl thus far and patient has been intolerant to these. Please advise.

## 2018-12-25 DIAGNOSIS — Z992 Dependence on renal dialysis: Secondary | ICD-10-CM | POA: Diagnosis not present

## 2018-12-25 DIAGNOSIS — D631 Anemia in chronic kidney disease: Secondary | ICD-10-CM | POA: Diagnosis not present

## 2018-12-25 DIAGNOSIS — N186 End stage renal disease: Secondary | ICD-10-CM | POA: Diagnosis not present

## 2018-12-25 DIAGNOSIS — D509 Iron deficiency anemia, unspecified: Secondary | ICD-10-CM | POA: Diagnosis not present

## 2018-12-26 DIAGNOSIS — D631 Anemia in chronic kidney disease: Secondary | ICD-10-CM | POA: Diagnosis not present

## 2018-12-26 DIAGNOSIS — D509 Iron deficiency anemia, unspecified: Secondary | ICD-10-CM | POA: Diagnosis not present

## 2018-12-26 DIAGNOSIS — Z992 Dependence on renal dialysis: Secondary | ICD-10-CM | POA: Diagnosis not present

## 2018-12-26 DIAGNOSIS — N186 End stage renal disease: Secondary | ICD-10-CM | POA: Diagnosis not present

## 2018-12-27 DIAGNOSIS — N2581 Secondary hyperparathyroidism of renal origin: Secondary | ICD-10-CM | POA: Diagnosis not present

## 2018-12-27 DIAGNOSIS — D509 Iron deficiency anemia, unspecified: Secondary | ICD-10-CM | POA: Diagnosis not present

## 2018-12-27 DIAGNOSIS — D631 Anemia in chronic kidney disease: Secondary | ICD-10-CM | POA: Diagnosis not present

## 2018-12-27 DIAGNOSIS — Z23 Encounter for immunization: Secondary | ICD-10-CM | POA: Diagnosis not present

## 2018-12-27 DIAGNOSIS — Z992 Dependence on renal dialysis: Secondary | ICD-10-CM | POA: Diagnosis not present

## 2018-12-27 DIAGNOSIS — N186 End stage renal disease: Secondary | ICD-10-CM | POA: Diagnosis not present

## 2018-12-28 ENCOUNTER — Ambulatory Visit: Payer: Medicare Other | Admitting: Podiatry

## 2018-12-28 DIAGNOSIS — Z992 Dependence on renal dialysis: Secondary | ICD-10-CM | POA: Diagnosis not present

## 2018-12-28 DIAGNOSIS — D509 Iron deficiency anemia, unspecified: Secondary | ICD-10-CM | POA: Diagnosis not present

## 2018-12-28 DIAGNOSIS — N186 End stage renal disease: Secondary | ICD-10-CM | POA: Diagnosis not present

## 2018-12-28 DIAGNOSIS — D631 Anemia in chronic kidney disease: Secondary | ICD-10-CM | POA: Diagnosis not present

## 2018-12-28 DIAGNOSIS — N2581 Secondary hyperparathyroidism of renal origin: Secondary | ICD-10-CM | POA: Diagnosis not present

## 2018-12-28 DIAGNOSIS — Z23 Encounter for immunization: Secondary | ICD-10-CM | POA: Diagnosis not present

## 2018-12-29 DIAGNOSIS — D631 Anemia in chronic kidney disease: Secondary | ICD-10-CM | POA: Diagnosis not present

## 2018-12-29 DIAGNOSIS — Z23 Encounter for immunization: Secondary | ICD-10-CM | POA: Diagnosis not present

## 2018-12-29 DIAGNOSIS — N2581 Secondary hyperparathyroidism of renal origin: Secondary | ICD-10-CM | POA: Diagnosis not present

## 2018-12-29 DIAGNOSIS — Z992 Dependence on renal dialysis: Secondary | ICD-10-CM | POA: Diagnosis not present

## 2018-12-29 DIAGNOSIS — D509 Iron deficiency anemia, unspecified: Secondary | ICD-10-CM | POA: Diagnosis not present

## 2018-12-29 DIAGNOSIS — N186 End stage renal disease: Secondary | ICD-10-CM | POA: Diagnosis not present

## 2018-12-30 DIAGNOSIS — D509 Iron deficiency anemia, unspecified: Secondary | ICD-10-CM | POA: Diagnosis not present

## 2018-12-30 DIAGNOSIS — Z992 Dependence on renal dialysis: Secondary | ICD-10-CM | POA: Diagnosis not present

## 2018-12-30 DIAGNOSIS — N2581 Secondary hyperparathyroidism of renal origin: Secondary | ICD-10-CM | POA: Diagnosis not present

## 2018-12-30 DIAGNOSIS — D631 Anemia in chronic kidney disease: Secondary | ICD-10-CM | POA: Diagnosis not present

## 2018-12-30 DIAGNOSIS — N186 End stage renal disease: Secondary | ICD-10-CM | POA: Diagnosis not present

## 2018-12-30 DIAGNOSIS — Z23 Encounter for immunization: Secondary | ICD-10-CM | POA: Diagnosis not present

## 2018-12-31 DIAGNOSIS — Z23 Encounter for immunization: Secondary | ICD-10-CM | POA: Diagnosis not present

## 2018-12-31 DIAGNOSIS — N2581 Secondary hyperparathyroidism of renal origin: Secondary | ICD-10-CM | POA: Diagnosis not present

## 2018-12-31 DIAGNOSIS — D631 Anemia in chronic kidney disease: Secondary | ICD-10-CM | POA: Diagnosis not present

## 2018-12-31 DIAGNOSIS — Z992 Dependence on renal dialysis: Secondary | ICD-10-CM | POA: Diagnosis not present

## 2018-12-31 DIAGNOSIS — N186 End stage renal disease: Secondary | ICD-10-CM | POA: Diagnosis not present

## 2018-12-31 DIAGNOSIS — D509 Iron deficiency anemia, unspecified: Secondary | ICD-10-CM | POA: Diagnosis not present

## 2019-01-01 DIAGNOSIS — D509 Iron deficiency anemia, unspecified: Secondary | ICD-10-CM | POA: Diagnosis not present

## 2019-01-01 DIAGNOSIS — N2581 Secondary hyperparathyroidism of renal origin: Secondary | ICD-10-CM | POA: Diagnosis not present

## 2019-01-01 DIAGNOSIS — D631 Anemia in chronic kidney disease: Secondary | ICD-10-CM | POA: Diagnosis not present

## 2019-01-01 DIAGNOSIS — Z23 Encounter for immunization: Secondary | ICD-10-CM | POA: Diagnosis not present

## 2019-01-01 DIAGNOSIS — Z992 Dependence on renal dialysis: Secondary | ICD-10-CM | POA: Diagnosis not present

## 2019-01-01 DIAGNOSIS — N186 End stage renal disease: Secondary | ICD-10-CM | POA: Diagnosis not present

## 2019-01-02 DIAGNOSIS — N2581 Secondary hyperparathyroidism of renal origin: Secondary | ICD-10-CM | POA: Diagnosis not present

## 2019-01-02 DIAGNOSIS — N186 End stage renal disease: Secondary | ICD-10-CM | POA: Diagnosis not present

## 2019-01-02 DIAGNOSIS — D631 Anemia in chronic kidney disease: Secondary | ICD-10-CM | POA: Diagnosis not present

## 2019-01-02 DIAGNOSIS — Z23 Encounter for immunization: Secondary | ICD-10-CM | POA: Diagnosis not present

## 2019-01-02 DIAGNOSIS — Z992 Dependence on renal dialysis: Secondary | ICD-10-CM | POA: Diagnosis not present

## 2019-01-02 DIAGNOSIS — D509 Iron deficiency anemia, unspecified: Secondary | ICD-10-CM | POA: Diagnosis not present

## 2019-01-03 DIAGNOSIS — D509 Iron deficiency anemia, unspecified: Secondary | ICD-10-CM | POA: Diagnosis not present

## 2019-01-03 DIAGNOSIS — Z992 Dependence on renal dialysis: Secondary | ICD-10-CM | POA: Diagnosis not present

## 2019-01-03 DIAGNOSIS — E119 Type 2 diabetes mellitus without complications: Secondary | ICD-10-CM | POA: Diagnosis not present

## 2019-01-03 DIAGNOSIS — N186 End stage renal disease: Secondary | ICD-10-CM | POA: Diagnosis not present

## 2019-01-03 DIAGNOSIS — N2581 Secondary hyperparathyroidism of renal origin: Secondary | ICD-10-CM | POA: Diagnosis not present

## 2019-01-03 DIAGNOSIS — Z794 Long term (current) use of insulin: Secondary | ICD-10-CM | POA: Diagnosis not present

## 2019-01-03 DIAGNOSIS — Z23 Encounter for immunization: Secondary | ICD-10-CM | POA: Diagnosis not present

## 2019-01-03 DIAGNOSIS — Z79899 Other long term (current) drug therapy: Secondary | ICD-10-CM | POA: Diagnosis not present

## 2019-01-03 DIAGNOSIS — D631 Anemia in chronic kidney disease: Secondary | ICD-10-CM | POA: Diagnosis not present

## 2019-01-03 LAB — HEMOGLOBIN A1C: Hemoglobin A1C: 7.8

## 2019-01-04 DIAGNOSIS — D631 Anemia in chronic kidney disease: Secondary | ICD-10-CM | POA: Diagnosis not present

## 2019-01-04 DIAGNOSIS — N186 End stage renal disease: Secondary | ICD-10-CM | POA: Diagnosis not present

## 2019-01-04 DIAGNOSIS — N2581 Secondary hyperparathyroidism of renal origin: Secondary | ICD-10-CM | POA: Diagnosis not present

## 2019-01-04 DIAGNOSIS — Z23 Encounter for immunization: Secondary | ICD-10-CM | POA: Diagnosis not present

## 2019-01-04 DIAGNOSIS — D509 Iron deficiency anemia, unspecified: Secondary | ICD-10-CM | POA: Diagnosis not present

## 2019-01-04 DIAGNOSIS — Z992 Dependence on renal dialysis: Secondary | ICD-10-CM | POA: Diagnosis not present

## 2019-01-05 DIAGNOSIS — N186 End stage renal disease: Secondary | ICD-10-CM | POA: Diagnosis not present

## 2019-01-05 DIAGNOSIS — I10 Essential (primary) hypertension: Secondary | ICD-10-CM | POA: Diagnosis not present

## 2019-01-05 DIAGNOSIS — Z992 Dependence on renal dialysis: Secondary | ICD-10-CM | POA: Diagnosis not present

## 2019-01-05 DIAGNOSIS — R29818 Other symptoms and signs involving the nervous system: Secondary | ICD-10-CM | POA: Diagnosis not present

## 2019-01-05 DIAGNOSIS — Z79899 Other long term (current) drug therapy: Secondary | ICD-10-CM | POA: Diagnosis not present

## 2019-01-05 DIAGNOSIS — D509 Iron deficiency anemia, unspecified: Secondary | ICD-10-CM | POA: Diagnosis not present

## 2019-01-05 DIAGNOSIS — I161 Hypertensive emergency: Secondary | ICD-10-CM | POA: Diagnosis not present

## 2019-01-05 DIAGNOSIS — D631 Anemia in chronic kidney disease: Secondary | ICD-10-CM | POA: Diagnosis not present

## 2019-01-05 DIAGNOSIS — E1122 Type 2 diabetes mellitus with diabetic chronic kidney disease: Secondary | ICD-10-CM | POA: Diagnosis not present

## 2019-01-05 DIAGNOSIS — R519 Headache, unspecified: Secondary | ICD-10-CM | POA: Diagnosis not present

## 2019-01-05 DIAGNOSIS — Z23 Encounter for immunization: Secondary | ICD-10-CM | POA: Diagnosis not present

## 2019-01-05 DIAGNOSIS — N2581 Secondary hyperparathyroidism of renal origin: Secondary | ICD-10-CM | POA: Diagnosis not present

## 2019-01-05 DIAGNOSIS — I120 Hypertensive chronic kidney disease with stage 5 chronic kidney disease or end stage renal disease: Secondary | ICD-10-CM | POA: Diagnosis not present

## 2019-01-06 DIAGNOSIS — D631 Anemia in chronic kidney disease: Secondary | ICD-10-CM | POA: Diagnosis not present

## 2019-01-06 DIAGNOSIS — Z23 Encounter for immunization: Secondary | ICD-10-CM | POA: Diagnosis not present

## 2019-01-06 DIAGNOSIS — Z992 Dependence on renal dialysis: Secondary | ICD-10-CM | POA: Diagnosis not present

## 2019-01-06 DIAGNOSIS — D509 Iron deficiency anemia, unspecified: Secondary | ICD-10-CM | POA: Diagnosis not present

## 2019-01-06 DIAGNOSIS — N2581 Secondary hyperparathyroidism of renal origin: Secondary | ICD-10-CM | POA: Diagnosis not present

## 2019-01-06 DIAGNOSIS — N186 End stage renal disease: Secondary | ICD-10-CM | POA: Diagnosis not present

## 2019-01-07 DIAGNOSIS — N2581 Secondary hyperparathyroidism of renal origin: Secondary | ICD-10-CM | POA: Diagnosis not present

## 2019-01-07 DIAGNOSIS — D631 Anemia in chronic kidney disease: Secondary | ICD-10-CM | POA: Diagnosis not present

## 2019-01-07 DIAGNOSIS — Z992 Dependence on renal dialysis: Secondary | ICD-10-CM | POA: Diagnosis not present

## 2019-01-07 DIAGNOSIS — D509 Iron deficiency anemia, unspecified: Secondary | ICD-10-CM | POA: Diagnosis not present

## 2019-01-07 DIAGNOSIS — N186 End stage renal disease: Secondary | ICD-10-CM | POA: Diagnosis not present

## 2019-01-07 DIAGNOSIS — I10 Essential (primary) hypertension: Secondary | ICD-10-CM | POA: Diagnosis not present

## 2019-01-07 DIAGNOSIS — Z23 Encounter for immunization: Secondary | ICD-10-CM | POA: Diagnosis not present

## 2019-01-08 ENCOUNTER — Other Ambulatory Visit (HOSPITAL_COMMUNITY): Payer: Self-pay | Admitting: Family Medicine

## 2019-01-08 ENCOUNTER — Other Ambulatory Visit: Payer: Self-pay | Admitting: Family Medicine

## 2019-01-08 DIAGNOSIS — Z992 Dependence on renal dialysis: Secondary | ICD-10-CM | POA: Diagnosis not present

## 2019-01-08 DIAGNOSIS — N2581 Secondary hyperparathyroidism of renal origin: Secondary | ICD-10-CM | POA: Diagnosis not present

## 2019-01-08 DIAGNOSIS — D631 Anemia in chronic kidney disease: Secondary | ICD-10-CM | POA: Diagnosis not present

## 2019-01-08 DIAGNOSIS — I639 Cerebral infarction, unspecified: Secondary | ICD-10-CM

## 2019-01-08 DIAGNOSIS — N186 End stage renal disease: Secondary | ICD-10-CM | POA: Diagnosis not present

## 2019-01-08 DIAGNOSIS — D509 Iron deficiency anemia, unspecified: Secondary | ICD-10-CM | POA: Diagnosis not present

## 2019-01-08 DIAGNOSIS — Z23 Encounter for immunization: Secondary | ICD-10-CM | POA: Diagnosis not present

## 2019-01-09 DIAGNOSIS — Z23 Encounter for immunization: Secondary | ICD-10-CM | POA: Diagnosis not present

## 2019-01-09 DIAGNOSIS — D509 Iron deficiency anemia, unspecified: Secondary | ICD-10-CM | POA: Diagnosis not present

## 2019-01-09 DIAGNOSIS — N186 End stage renal disease: Secondary | ICD-10-CM | POA: Diagnosis not present

## 2019-01-09 DIAGNOSIS — D631 Anemia in chronic kidney disease: Secondary | ICD-10-CM | POA: Diagnosis not present

## 2019-01-09 DIAGNOSIS — N2581 Secondary hyperparathyroidism of renal origin: Secondary | ICD-10-CM | POA: Diagnosis not present

## 2019-01-09 DIAGNOSIS — Z992 Dependence on renal dialysis: Secondary | ICD-10-CM | POA: Diagnosis not present

## 2019-01-10 DIAGNOSIS — D631 Anemia in chronic kidney disease: Secondary | ICD-10-CM | POA: Diagnosis not present

## 2019-01-10 DIAGNOSIS — D509 Iron deficiency anemia, unspecified: Secondary | ICD-10-CM | POA: Diagnosis not present

## 2019-01-10 DIAGNOSIS — Z992 Dependence on renal dialysis: Secondary | ICD-10-CM | POA: Diagnosis not present

## 2019-01-10 DIAGNOSIS — N2581 Secondary hyperparathyroidism of renal origin: Secondary | ICD-10-CM | POA: Diagnosis not present

## 2019-01-10 DIAGNOSIS — N186 End stage renal disease: Secondary | ICD-10-CM | POA: Diagnosis not present

## 2019-01-10 DIAGNOSIS — Z23 Encounter for immunization: Secondary | ICD-10-CM | POA: Diagnosis not present

## 2019-01-11 ENCOUNTER — Other Ambulatory Visit: Payer: Self-pay

## 2019-01-11 ENCOUNTER — Ambulatory Visit (HOSPITAL_COMMUNITY)
Admission: RE | Admit: 2019-01-11 | Discharge: 2019-01-11 | Disposition: A | Discharge status: Home / Self Care | Payer: Medicare Other | Source: Ambulatory Visit | Attending: Family Medicine | Admitting: Family Medicine

## 2019-01-11 DIAGNOSIS — N2581 Secondary hyperparathyroidism of renal origin: Secondary | ICD-10-CM | POA: Diagnosis not present

## 2019-01-11 DIAGNOSIS — D509 Iron deficiency anemia, unspecified: Secondary | ICD-10-CM | POA: Diagnosis not present

## 2019-01-11 DIAGNOSIS — Z23 Encounter for immunization: Secondary | ICD-10-CM | POA: Diagnosis not present

## 2019-01-11 DIAGNOSIS — I639 Cerebral infarction, unspecified: Secondary | ICD-10-CM | POA: Insufficient documentation

## 2019-01-11 DIAGNOSIS — D631 Anemia in chronic kidney disease: Secondary | ICD-10-CM | POA: Diagnosis not present

## 2019-01-11 DIAGNOSIS — N186 End stage renal disease: Secondary | ICD-10-CM | POA: Diagnosis not present

## 2019-01-11 DIAGNOSIS — Z992 Dependence on renal dialysis: Secondary | ICD-10-CM | POA: Diagnosis not present

## 2019-01-11 IMAGING — MR MR HEAD W/O CM
7 of 10 series · 26 of 48 positions shown · non-contrast
Comparison: No pertinent prior studies available for comparison.

CLINICAL DATA: Cerebral infarction, left-sided numbness and facial
drooping for 2 weeks

EXAM:
MRI HEAD WITHOUT CONTRAST
TECHNIQUE: Multiplanar, multiecho pulse sequences of the brain and surrounding
structures were obtained without intravenous contrast.

[Series 3: DWI · axial · 3.0mm · 0.71mm/px · z∈[-86,+76]mm · 7 of 55 slices shown (1 of 2)]
[im 1/55]
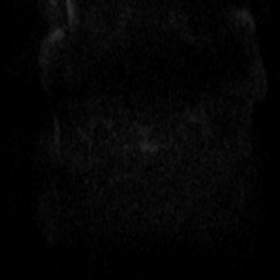
[im 10/55]
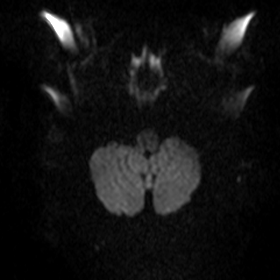
[im 19/55]
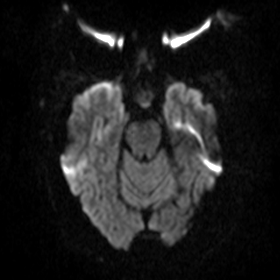
[im 28/55]
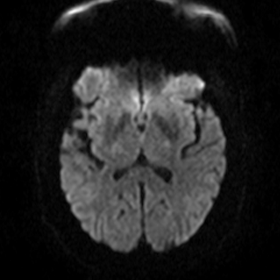
[im 37/55]
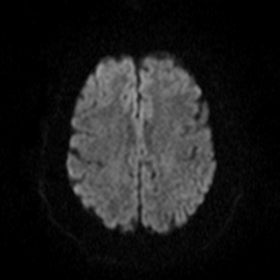
[im 46/55]
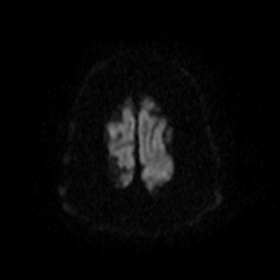
[im 55/55]
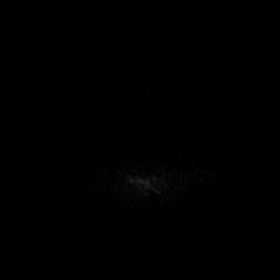

[Series 5: DWI · coronal · 5.0mm · 0.47mm/px · 4 of 38 slices shown (2 of 2)]
[im 1/38]
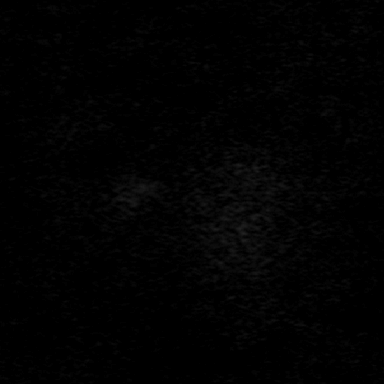
[im 13/38]
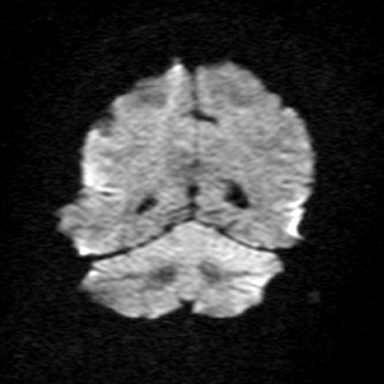
[im 25/38]
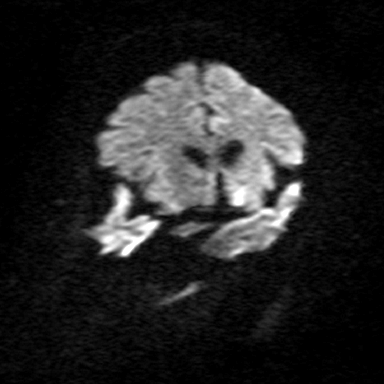
[im 38/38]
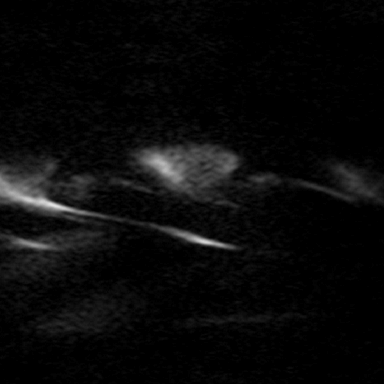

[Series 7: T1 · sagittal · 5.0mm · 0.39mm/px · 1 of 21 slices shown]
[im 1/21]
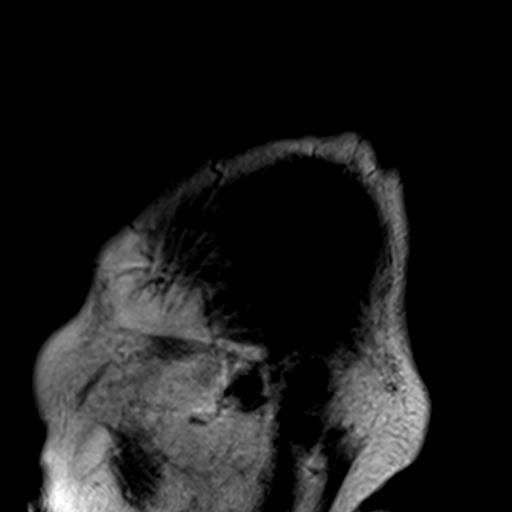

[Series 8: T2 · axial · 5.0mm · 0.46mm/px · z∈[-87,+55]mm · 3 of 23 slices shown (1 of 3)]
[im 1/23]
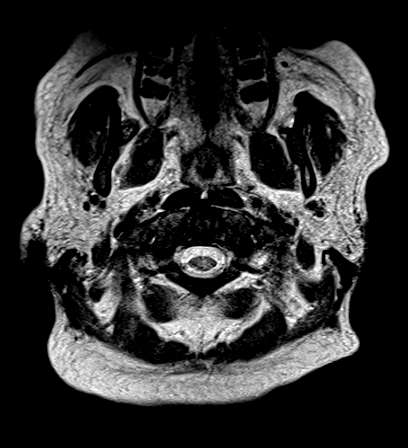
[im 12/23]
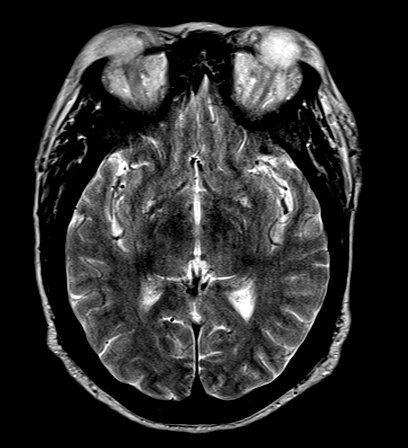
[im 23/23]
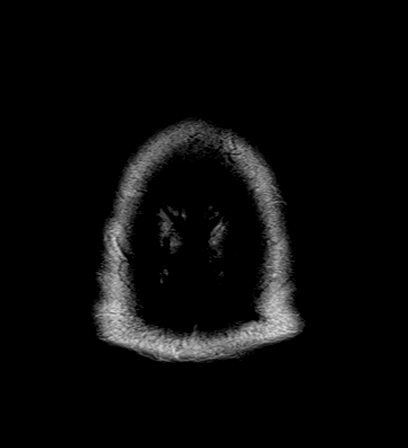

[Series 9: T2 · axial · 4.0mm · 0.40mm/px · z∈[-88,+56]mm · 3 of 30 slices shown (2 of 3)]
[im 1/30]
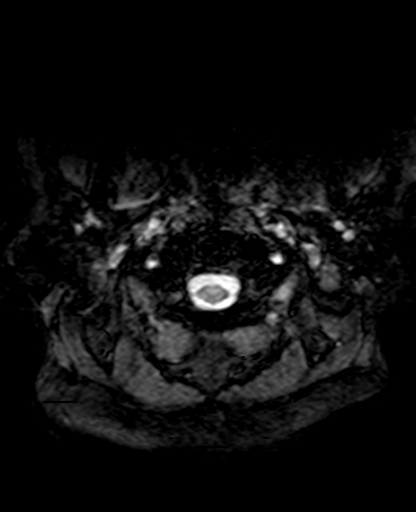
[im 15/30]
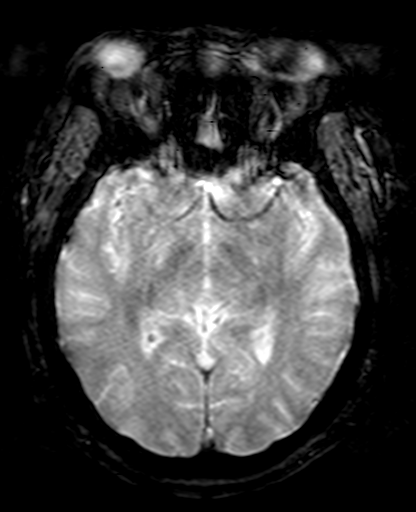
[im 30/30]
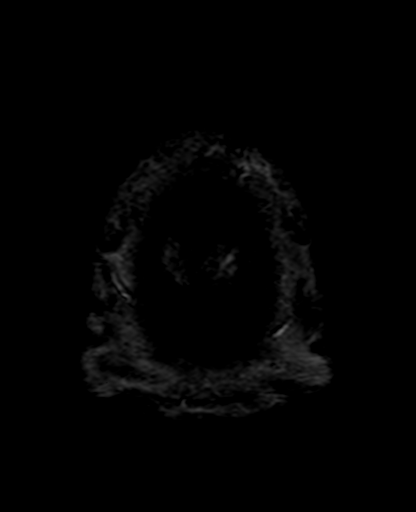

[Series 10: FLAIR · axial · 3.0mm · 0.31mm/px · z∈[-85,+53]mm · 5 of 47 slices shown]
[im 1/47]
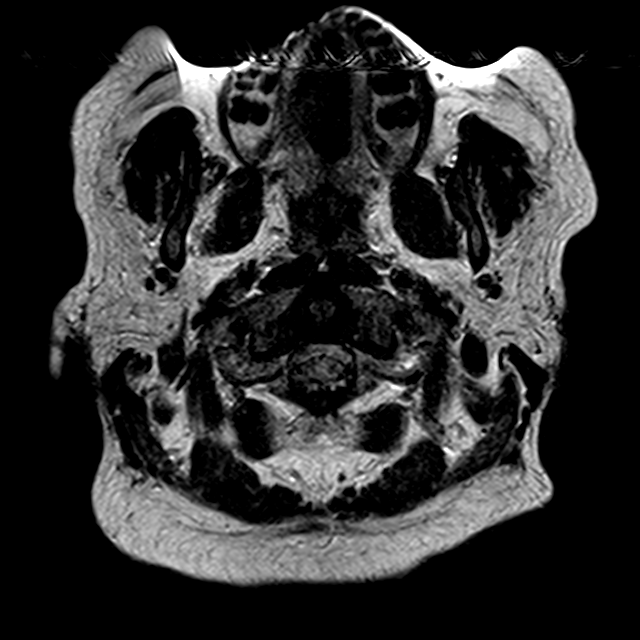
[im 12/47]
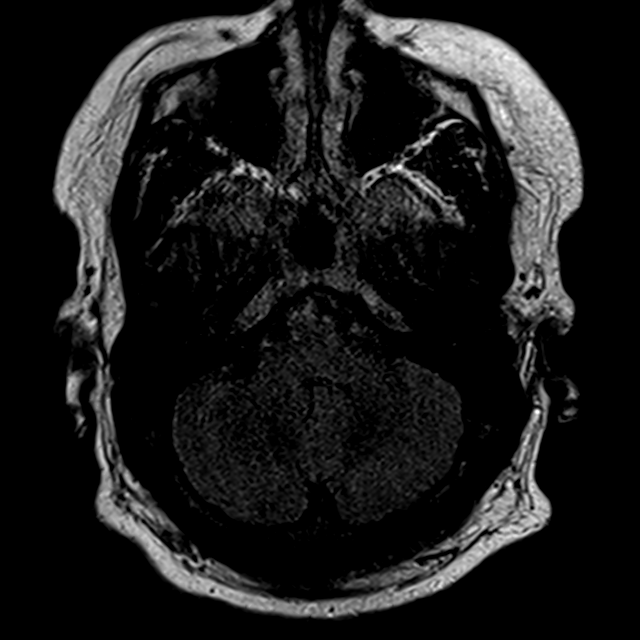
[im 24/47]
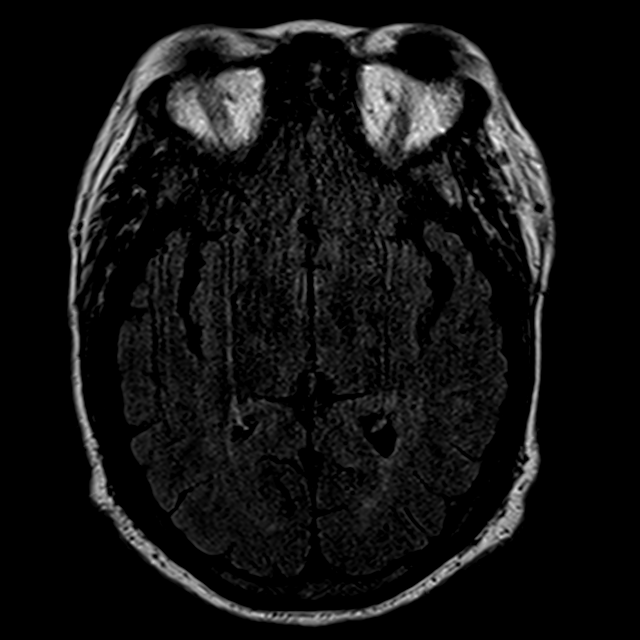
[im 35/47]
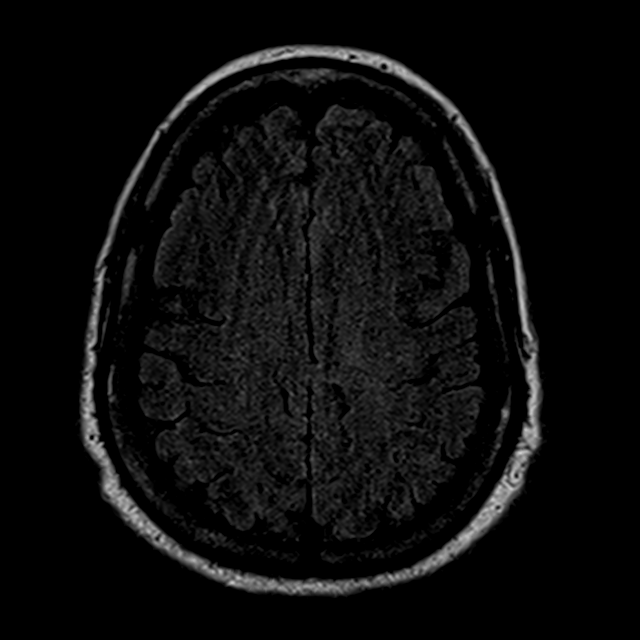
[im 47/47]
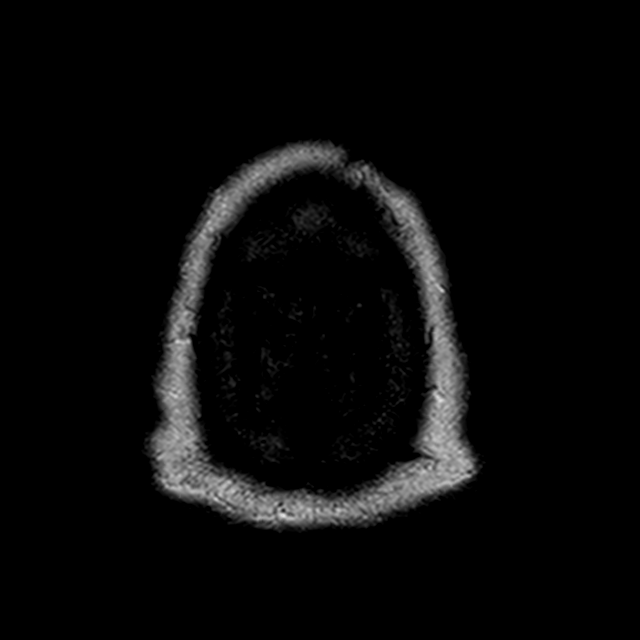

[Series 12: T2 · coronal · 5.0mm · 0.42mm/px · 3 of 28 slices shown (3 of 3)]
[im 1/28]
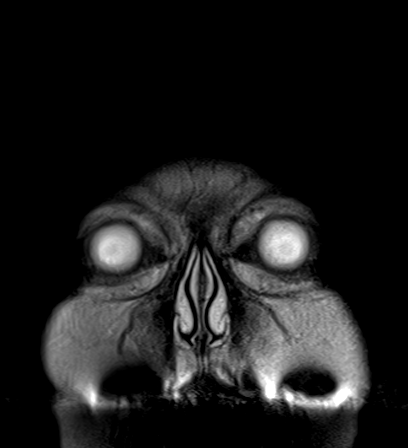
[im 14/28]
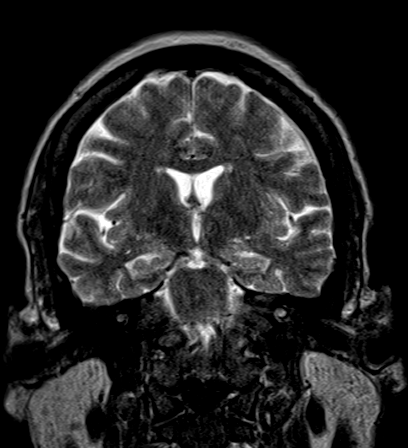
[im 28/28]
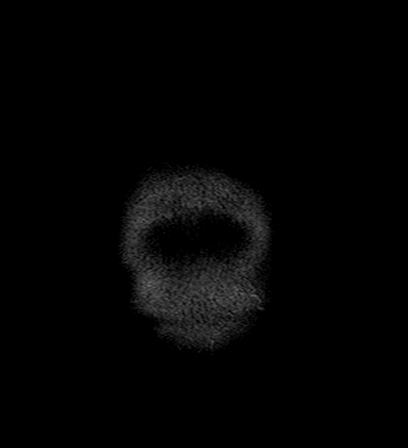

[26 of 48 positions shown; findings below may reference images not displayed]

FINDINGS: Mild-to-moderate motion degradation of multiple sequences.

Brain:

There is no convincing evidence of acute infarct.

No evidence of intracranial mass.

No midline shift or extra-axial fluid collection.

No chronic intracranial blood products.

There is a small focus of predominantly subcortical T2/FLAIR
hyperintensity within the left occipital lobe suspicious for chronic
subcortical infarct (series 10, image 18). Chronic lacunar infarct
within the right caudate.

Cerebral volume is normal for age.

Vascular: Flow voids maintained within the proximal large arterial
vessels.

Skull and upper cervical spine: No focal marrow lesion.

Sinuses/Orbits: Visualized orbits demonstrate no acute abnormality.
No significant paranasal sinus disease or mastoid effusion.
IMPRESSION: 1. Motion degraded examination.
2. No evidence of acute intracranial abnormality, including acute or
recent subacute infarction.
3. Probable small chronic subcortical infarct within the left
occipital lobe.
4. Chronic lacunar infarct within the right caudate.

## 2019-01-12 DIAGNOSIS — Z23 Encounter for immunization: Secondary | ICD-10-CM | POA: Diagnosis not present

## 2019-01-12 DIAGNOSIS — N186 End stage renal disease: Secondary | ICD-10-CM | POA: Diagnosis not present

## 2019-01-12 DIAGNOSIS — D509 Iron deficiency anemia, unspecified: Secondary | ICD-10-CM | POA: Diagnosis not present

## 2019-01-12 DIAGNOSIS — D631 Anemia in chronic kidney disease: Secondary | ICD-10-CM | POA: Diagnosis not present

## 2019-01-12 DIAGNOSIS — Z992 Dependence on renal dialysis: Secondary | ICD-10-CM | POA: Diagnosis not present

## 2019-01-12 DIAGNOSIS — N2581 Secondary hyperparathyroidism of renal origin: Secondary | ICD-10-CM | POA: Diagnosis not present

## 2019-01-13 DIAGNOSIS — D509 Iron deficiency anemia, unspecified: Secondary | ICD-10-CM | POA: Diagnosis not present

## 2019-01-13 DIAGNOSIS — D631 Anemia in chronic kidney disease: Secondary | ICD-10-CM | POA: Diagnosis not present

## 2019-01-13 DIAGNOSIS — N2581 Secondary hyperparathyroidism of renal origin: Secondary | ICD-10-CM | POA: Diagnosis not present

## 2019-01-13 DIAGNOSIS — Z23 Encounter for immunization: Secondary | ICD-10-CM | POA: Diagnosis not present

## 2019-01-13 DIAGNOSIS — N186 End stage renal disease: Secondary | ICD-10-CM | POA: Diagnosis not present

## 2019-01-13 DIAGNOSIS — Z992 Dependence on renal dialysis: Secondary | ICD-10-CM | POA: Diagnosis not present

## 2019-01-14 DIAGNOSIS — Z23 Encounter for immunization: Secondary | ICD-10-CM | POA: Diagnosis not present

## 2019-01-14 DIAGNOSIS — D509 Iron deficiency anemia, unspecified: Secondary | ICD-10-CM | POA: Diagnosis not present

## 2019-01-14 DIAGNOSIS — N2581 Secondary hyperparathyroidism of renal origin: Secondary | ICD-10-CM | POA: Diagnosis not present

## 2019-01-14 DIAGNOSIS — Z992 Dependence on renal dialysis: Secondary | ICD-10-CM | POA: Diagnosis not present

## 2019-01-14 DIAGNOSIS — D631 Anemia in chronic kidney disease: Secondary | ICD-10-CM | POA: Diagnosis not present

## 2019-01-14 DIAGNOSIS — N186 End stage renal disease: Secondary | ICD-10-CM | POA: Diagnosis not present

## 2019-01-15 DIAGNOSIS — Z23 Encounter for immunization: Secondary | ICD-10-CM | POA: Diagnosis not present

## 2019-01-15 DIAGNOSIS — N2581 Secondary hyperparathyroidism of renal origin: Secondary | ICD-10-CM | POA: Diagnosis not present

## 2019-01-15 DIAGNOSIS — D631 Anemia in chronic kidney disease: Secondary | ICD-10-CM | POA: Diagnosis not present

## 2019-01-15 DIAGNOSIS — Z992 Dependence on renal dialysis: Secondary | ICD-10-CM | POA: Diagnosis not present

## 2019-01-15 DIAGNOSIS — D509 Iron deficiency anemia, unspecified: Secondary | ICD-10-CM | POA: Diagnosis not present

## 2019-01-15 DIAGNOSIS — N186 End stage renal disease: Secondary | ICD-10-CM | POA: Diagnosis not present

## 2019-01-16 DIAGNOSIS — Z23 Encounter for immunization: Secondary | ICD-10-CM | POA: Diagnosis not present

## 2019-01-16 DIAGNOSIS — D631 Anemia in chronic kidney disease: Secondary | ICD-10-CM | POA: Diagnosis not present

## 2019-01-16 DIAGNOSIS — Z992 Dependence on renal dialysis: Secondary | ICD-10-CM | POA: Diagnosis not present

## 2019-01-16 DIAGNOSIS — N186 End stage renal disease: Secondary | ICD-10-CM | POA: Diagnosis not present

## 2019-01-16 DIAGNOSIS — D509 Iron deficiency anemia, unspecified: Secondary | ICD-10-CM | POA: Diagnosis not present

## 2019-01-16 DIAGNOSIS — N2581 Secondary hyperparathyroidism of renal origin: Secondary | ICD-10-CM | POA: Diagnosis not present

## 2019-01-17 DIAGNOSIS — Z23 Encounter for immunization: Secondary | ICD-10-CM | POA: Diagnosis not present

## 2019-01-17 DIAGNOSIS — N2581 Secondary hyperparathyroidism of renal origin: Secondary | ICD-10-CM | POA: Diagnosis not present

## 2019-01-17 DIAGNOSIS — D631 Anemia in chronic kidney disease: Secondary | ICD-10-CM | POA: Diagnosis not present

## 2019-01-17 DIAGNOSIS — Z992 Dependence on renal dialysis: Secondary | ICD-10-CM | POA: Diagnosis not present

## 2019-01-17 DIAGNOSIS — N186 End stage renal disease: Secondary | ICD-10-CM | POA: Diagnosis not present

## 2019-01-17 DIAGNOSIS — D509 Iron deficiency anemia, unspecified: Secondary | ICD-10-CM | POA: Diagnosis not present

## 2019-01-18 DIAGNOSIS — D509 Iron deficiency anemia, unspecified: Secondary | ICD-10-CM | POA: Diagnosis not present

## 2019-01-18 DIAGNOSIS — D631 Anemia in chronic kidney disease: Secondary | ICD-10-CM | POA: Diagnosis not present

## 2019-01-18 DIAGNOSIS — N186 End stage renal disease: Secondary | ICD-10-CM | POA: Diagnosis not present

## 2019-01-18 DIAGNOSIS — N2581 Secondary hyperparathyroidism of renal origin: Secondary | ICD-10-CM | POA: Diagnosis not present

## 2019-01-18 DIAGNOSIS — Z23 Encounter for immunization: Secondary | ICD-10-CM | POA: Diagnosis not present

## 2019-01-18 DIAGNOSIS — Z992 Dependence on renal dialysis: Secondary | ICD-10-CM | POA: Diagnosis not present

## 2019-01-19 DIAGNOSIS — D509 Iron deficiency anemia, unspecified: Secondary | ICD-10-CM | POA: Diagnosis not present

## 2019-01-19 DIAGNOSIS — Z992 Dependence on renal dialysis: Secondary | ICD-10-CM | POA: Diagnosis not present

## 2019-01-19 DIAGNOSIS — D631 Anemia in chronic kidney disease: Secondary | ICD-10-CM | POA: Diagnosis not present

## 2019-01-19 DIAGNOSIS — N186 End stage renal disease: Secondary | ICD-10-CM | POA: Diagnosis not present

## 2019-01-19 DIAGNOSIS — Z23 Encounter for immunization: Secondary | ICD-10-CM | POA: Diagnosis not present

## 2019-01-19 DIAGNOSIS — N2581 Secondary hyperparathyroidism of renal origin: Secondary | ICD-10-CM | POA: Diagnosis not present

## 2019-01-20 DIAGNOSIS — N186 End stage renal disease: Secondary | ICD-10-CM | POA: Diagnosis not present

## 2019-01-20 DIAGNOSIS — N2581 Secondary hyperparathyroidism of renal origin: Secondary | ICD-10-CM | POA: Diagnosis not present

## 2019-01-20 DIAGNOSIS — Z23 Encounter for immunization: Secondary | ICD-10-CM | POA: Diagnosis not present

## 2019-01-20 DIAGNOSIS — D509 Iron deficiency anemia, unspecified: Secondary | ICD-10-CM | POA: Diagnosis not present

## 2019-01-20 DIAGNOSIS — Z992 Dependence on renal dialysis: Secondary | ICD-10-CM | POA: Diagnosis not present

## 2019-01-20 DIAGNOSIS — D631 Anemia in chronic kidney disease: Secondary | ICD-10-CM | POA: Diagnosis not present

## 2019-01-21 DIAGNOSIS — N2581 Secondary hyperparathyroidism of renal origin: Secondary | ICD-10-CM | POA: Diagnosis not present

## 2019-01-21 DIAGNOSIS — Z23 Encounter for immunization: Secondary | ICD-10-CM | POA: Diagnosis not present

## 2019-01-21 DIAGNOSIS — D509 Iron deficiency anemia, unspecified: Secondary | ICD-10-CM | POA: Diagnosis not present

## 2019-01-21 DIAGNOSIS — N186 End stage renal disease: Secondary | ICD-10-CM | POA: Diagnosis not present

## 2019-01-21 DIAGNOSIS — Z992 Dependence on renal dialysis: Secondary | ICD-10-CM | POA: Diagnosis not present

## 2019-01-21 DIAGNOSIS — D631 Anemia in chronic kidney disease: Secondary | ICD-10-CM | POA: Diagnosis not present

## 2019-01-22 DIAGNOSIS — D509 Iron deficiency anemia, unspecified: Secondary | ICD-10-CM | POA: Diagnosis not present

## 2019-01-22 DIAGNOSIS — N186 End stage renal disease: Secondary | ICD-10-CM | POA: Diagnosis not present

## 2019-01-22 DIAGNOSIS — Z992 Dependence on renal dialysis: Secondary | ICD-10-CM | POA: Diagnosis not present

## 2019-01-22 DIAGNOSIS — N2581 Secondary hyperparathyroidism of renal origin: Secondary | ICD-10-CM | POA: Diagnosis not present

## 2019-01-22 DIAGNOSIS — Z23 Encounter for immunization: Secondary | ICD-10-CM | POA: Diagnosis not present

## 2019-01-22 DIAGNOSIS — D631 Anemia in chronic kidney disease: Secondary | ICD-10-CM | POA: Diagnosis not present

## 2019-01-23 DIAGNOSIS — Z23 Encounter for immunization: Secondary | ICD-10-CM | POA: Diagnosis not present

## 2019-01-23 DIAGNOSIS — N2581 Secondary hyperparathyroidism of renal origin: Secondary | ICD-10-CM | POA: Diagnosis not present

## 2019-01-23 DIAGNOSIS — K589 Irritable bowel syndrome without diarrhea: Secondary | ICD-10-CM | POA: Diagnosis not present

## 2019-01-23 DIAGNOSIS — N186 End stage renal disease: Secondary | ICD-10-CM | POA: Diagnosis not present

## 2019-01-23 DIAGNOSIS — E113413 Type 2 diabetes mellitus with severe nonproliferative diabetic retinopathy with macular edema, bilateral: Secondary | ICD-10-CM | POA: Diagnosis not present

## 2019-01-23 DIAGNOSIS — D631 Anemia in chronic kidney disease: Secondary | ICD-10-CM | POA: Diagnosis not present

## 2019-01-23 DIAGNOSIS — D509 Iron deficiency anemia, unspecified: Secondary | ICD-10-CM | POA: Diagnosis not present

## 2019-01-23 DIAGNOSIS — I120 Hypertensive chronic kidney disease with stage 5 chronic kidney disease or end stage renal disease: Secondary | ICD-10-CM | POA: Diagnosis not present

## 2019-01-23 DIAGNOSIS — Z992 Dependence on renal dialysis: Secondary | ICD-10-CM | POA: Diagnosis not present

## 2019-01-24 DIAGNOSIS — N186 End stage renal disease: Secondary | ICD-10-CM | POA: Diagnosis not present

## 2019-01-24 DIAGNOSIS — Z992 Dependence on renal dialysis: Secondary | ICD-10-CM | POA: Diagnosis not present

## 2019-01-24 DIAGNOSIS — N2581 Secondary hyperparathyroidism of renal origin: Secondary | ICD-10-CM | POA: Diagnosis not present

## 2019-01-24 DIAGNOSIS — D631 Anemia in chronic kidney disease: Secondary | ICD-10-CM | POA: Diagnosis not present

## 2019-01-24 DIAGNOSIS — D509 Iron deficiency anemia, unspecified: Secondary | ICD-10-CM | POA: Diagnosis not present

## 2019-01-24 DIAGNOSIS — Z23 Encounter for immunization: Secondary | ICD-10-CM | POA: Diagnosis not present

## 2019-01-25 ENCOUNTER — Ambulatory Visit (INDEPENDENT_AMBULATORY_CARE_PROVIDER_SITE_OTHER): Payer: Medicare Other | Admitting: Podiatry

## 2019-01-25 ENCOUNTER — Other Ambulatory Visit: Payer: Self-pay

## 2019-01-25 DIAGNOSIS — E11621 Type 2 diabetes mellitus with foot ulcer: Secondary | ICD-10-CM

## 2019-01-25 DIAGNOSIS — Z23 Encounter for immunization: Secondary | ICD-10-CM | POA: Diagnosis not present

## 2019-01-25 DIAGNOSIS — N2581 Secondary hyperparathyroidism of renal origin: Secondary | ICD-10-CM | POA: Diagnosis not present

## 2019-01-25 DIAGNOSIS — N186 End stage renal disease: Secondary | ICD-10-CM | POA: Diagnosis not present

## 2019-01-25 DIAGNOSIS — L97411 Non-pressure chronic ulcer of right heel and midfoot limited to breakdown of skin: Secondary | ICD-10-CM

## 2019-01-25 DIAGNOSIS — D631 Anemia in chronic kidney disease: Secondary | ICD-10-CM | POA: Diagnosis not present

## 2019-01-25 DIAGNOSIS — D509 Iron deficiency anemia, unspecified: Secondary | ICD-10-CM | POA: Diagnosis not present

## 2019-01-25 DIAGNOSIS — Z992 Dependence on renal dialysis: Secondary | ICD-10-CM | POA: Diagnosis not present

## 2019-01-25 NOTE — Progress Notes (Signed)
Subjective:  Patient ID: Janet Mitchell, female    DOB: 26-Oct-1965,  MRN: [id number]  Chief Complaint  Patient presents with  . Foot Problem    Pt states bilateral foot swelling which is due to her new Amlodipine rx. Pt states she has no pain with this.  . Callouses    Bilateral callouses proximal 1st. Dry, no drainage.    53 y.o. female presents for wound care. Has calluses to both feet. States that both feet are swollen. Has DM shoes thinks that it rubbed a callus on her right foot.   Review of Systems: Negative except as noted in the HPI. Denies N/V/F/Ch.  Past Medical History:  Diagnosis Date  . Anemia   . CHF (congestive heart failure) ([place])   . Chronic cholecystitis with calculus   . ESRD (end stage renal disease) on dialysis [facility])    "peritoneal dialysis q hs" (03/09/2018)  . Headache    "a few/wk" (03/09/2018)  . History of blood transfusion 10/2017   "low blood count" (03/09/2018)  . Hypertension   . Spinal headache   . Type II diabetes mellitus ([place])     Current Outpatient Medications:  .  ALPRAZolam (XANAX) 0.5 MG tablet, Take 0.5 mg by mouth at bedtime as needed for anxiety., Disp: , Rfl:  .  amLODipine (NORVASC) 10 MG tablet, Take 1 tablet (10 mg total) by mouth daily., Disp: 30 tablet, Rfl: 3 .  calcitRIOL (ROCALTROL) 0.5 MCG capsule, Take 0.5-1 mcg by mouth See admin instructions. 0.43mcg on Mondays through Fridays and take 68mcg on Saturdays and Sundays, Disp: , Rfl:  .  Crisaborole (EUCRISA) 2 % OINT, Apply 1 application topically daily as needed (for rash/irritation)., Disp: , Rfl:  .  doxazosin (CARDURA) 8 MG tablet, Take 8 mg by mouth at bedtime., Disp: , Rfl:  .  Insulin Glargine (TOUJEO MAX SOLOSTAR) 300 UNIT/ML SOPN, Inject 14 Units into the skin at bedtime., Disp: 3 pen, Rfl: 3 .  labetalol (NORMODYNE) 200 MG tablet, Take 200 mg by mouth 2 (two) times daily., Disp: , Rfl:   Social History   Tobacco Use  Smoking Status Passive Smoke Exposure - Never  Smoker  Smokeless Tobacco Never Used    No Known Allergies Objective:  There were no vitals filed for this visit. There is no height or weight on file to calculate BMI. Constitutional Well developed. Well nourished.  Vascular Dorsalis pedis pulses palpable bilaterally. Posterior tibial pulses palpable bilaterally. Capillary refill normal to all digits.  No cyanosis or clubbing noted. Pedal hair growth normal.  Neurologic Normal speech. Oriented to person, place, and time. Protective sensation absent  Dermatologic Wound Location: R 1st MPj Wound Base: Granular/Healthy Peri-wound: Calloused Exudate: None: wound tissue dry Wound Measurements: -0.2x0.2x0.1 L 1st metatarsal callus without open area upon debridement.  Orthopedic: No pain to palpation either foot. Hx left hallux amputation   Radiographs: none Assessment:   1. Diabetic ulcer of right midfoot associated with type 2 diabetes mellitus, limited to breakdown of skin ([place])    Plan:  Patient was evaluated and treated and all questions answered.  Ulcer right 1st MPJ medially -Debridement as below. -Dressed with silvadene, DSD. -Continue off-loading with surgical shoe.  Procedure: Selective Debridement of Wound Rationale: Removal of devitalized tissue from the wound to promote healing.  Pre-Debridement Wound Measurements: 0.2 cm x 0.2 cm x 0.1 cm  Post-Debridement Wound Measurements: same as pre-debridement. Type of Debridement: sharp selective Tissue Removed: Devitalized soft-tissue Dressing: Dry, sterile, compression dressing.  Disposition: Patient tolerated procedure well. Patient to return in 1 week for follow-up.    Return in about 1 month (around 02/25/2019) for wound f/u right foot.

## 2019-01-26 DIAGNOSIS — N2581 Secondary hyperparathyroidism of renal origin: Secondary | ICD-10-CM | POA: Diagnosis not present

## 2019-01-26 DIAGNOSIS — D509 Iron deficiency anemia, unspecified: Secondary | ICD-10-CM | POA: Diagnosis not present

## 2019-01-26 DIAGNOSIS — Z992 Dependence on renal dialysis: Secondary | ICD-10-CM | POA: Diagnosis not present

## 2019-01-26 DIAGNOSIS — D631 Anemia in chronic kidney disease: Secondary | ICD-10-CM | POA: Diagnosis not present

## 2019-01-26 DIAGNOSIS — Z23 Encounter for immunization: Secondary | ICD-10-CM | POA: Diagnosis not present

## 2019-01-26 DIAGNOSIS — N186 End stage renal disease: Secondary | ICD-10-CM | POA: Diagnosis not present

## 2019-01-27 DIAGNOSIS — D509 Iron deficiency anemia, unspecified: Secondary | ICD-10-CM | POA: Diagnosis not present

## 2019-01-27 DIAGNOSIS — Z992 Dependence on renal dialysis: Secondary | ICD-10-CM | POA: Diagnosis not present

## 2019-01-27 DIAGNOSIS — D631 Anemia in chronic kidney disease: Secondary | ICD-10-CM | POA: Diagnosis not present

## 2019-01-27 DIAGNOSIS — N186 End stage renal disease: Secondary | ICD-10-CM | POA: Diagnosis not present

## 2019-01-28 DIAGNOSIS — D631 Anemia in chronic kidney disease: Secondary | ICD-10-CM | POA: Diagnosis not present

## 2019-01-28 DIAGNOSIS — N186 End stage renal disease: Secondary | ICD-10-CM | POA: Diagnosis not present

## 2019-01-28 DIAGNOSIS — Z992 Dependence on renal dialysis: Secondary | ICD-10-CM | POA: Diagnosis not present

## 2019-01-28 DIAGNOSIS — D509 Iron deficiency anemia, unspecified: Secondary | ICD-10-CM | POA: Diagnosis not present

## 2019-01-29 DIAGNOSIS — Z992 Dependence on renal dialysis: Secondary | ICD-10-CM | POA: Diagnosis not present

## 2019-01-29 DIAGNOSIS — D631 Anemia in chronic kidney disease: Secondary | ICD-10-CM | POA: Diagnosis not present

## 2019-01-29 DIAGNOSIS — N186 End stage renal disease: Secondary | ICD-10-CM | POA: Diagnosis not present

## 2019-01-29 DIAGNOSIS — D509 Iron deficiency anemia, unspecified: Secondary | ICD-10-CM | POA: Diagnosis not present

## 2019-01-30 DIAGNOSIS — D631 Anemia in chronic kidney disease: Secondary | ICD-10-CM | POA: Diagnosis not present

## 2019-01-30 DIAGNOSIS — N186 End stage renal disease: Secondary | ICD-10-CM | POA: Diagnosis not present

## 2019-01-30 DIAGNOSIS — Z992 Dependence on renal dialysis: Secondary | ICD-10-CM | POA: Diagnosis not present

## 2019-01-30 DIAGNOSIS — D509 Iron deficiency anemia, unspecified: Secondary | ICD-10-CM | POA: Diagnosis not present

## 2019-01-31 DIAGNOSIS — N186 End stage renal disease: Secondary | ICD-10-CM | POA: Diagnosis not present

## 2019-01-31 DIAGNOSIS — D509 Iron deficiency anemia, unspecified: Secondary | ICD-10-CM | POA: Diagnosis not present

## 2019-01-31 DIAGNOSIS — D631 Anemia in chronic kidney disease: Secondary | ICD-10-CM | POA: Diagnosis not present

## 2019-01-31 DIAGNOSIS — Z992 Dependence on renal dialysis: Secondary | ICD-10-CM | POA: Diagnosis not present

## 2019-02-01 DIAGNOSIS — D509 Iron deficiency anemia, unspecified: Secondary | ICD-10-CM | POA: Diagnosis not present

## 2019-02-01 DIAGNOSIS — N186 End stage renal disease: Secondary | ICD-10-CM | POA: Diagnosis not present

## 2019-02-01 DIAGNOSIS — Z992 Dependence on renal dialysis: Secondary | ICD-10-CM | POA: Diagnosis not present

## 2019-02-01 DIAGNOSIS — D631 Anemia in chronic kidney disease: Secondary | ICD-10-CM | POA: Diagnosis not present

## 2019-02-02 DIAGNOSIS — N186 End stage renal disease: Secondary | ICD-10-CM | POA: Diagnosis not present

## 2019-02-02 DIAGNOSIS — D631 Anemia in chronic kidney disease: Secondary | ICD-10-CM | POA: Diagnosis not present

## 2019-02-02 DIAGNOSIS — D509 Iron deficiency anemia, unspecified: Secondary | ICD-10-CM | POA: Diagnosis not present

## 2019-02-02 DIAGNOSIS — Z992 Dependence on renal dialysis: Secondary | ICD-10-CM | POA: Diagnosis not present

## 2019-02-03 DIAGNOSIS — N186 End stage renal disease: Secondary | ICD-10-CM | POA: Diagnosis not present

## 2019-02-03 DIAGNOSIS — D631 Anemia in chronic kidney disease: Secondary | ICD-10-CM | POA: Diagnosis not present

## 2019-02-03 DIAGNOSIS — Z992 Dependence on renal dialysis: Secondary | ICD-10-CM | POA: Diagnosis not present

## 2019-02-03 DIAGNOSIS — D509 Iron deficiency anemia, unspecified: Secondary | ICD-10-CM | POA: Diagnosis not present

## 2019-02-04 DIAGNOSIS — Z992 Dependence on renal dialysis: Secondary | ICD-10-CM | POA: Diagnosis not present

## 2019-02-04 DIAGNOSIS — D509 Iron deficiency anemia, unspecified: Secondary | ICD-10-CM | POA: Diagnosis not present

## 2019-02-04 DIAGNOSIS — N186 End stage renal disease: Secondary | ICD-10-CM | POA: Diagnosis not present

## 2019-02-04 DIAGNOSIS — D631 Anemia in chronic kidney disease: Secondary | ICD-10-CM | POA: Diagnosis not present

## 2019-02-05 DIAGNOSIS — D631 Anemia in chronic kidney disease: Secondary | ICD-10-CM | POA: Diagnosis not present

## 2019-02-05 DIAGNOSIS — N186 End stage renal disease: Secondary | ICD-10-CM | POA: Diagnosis not present

## 2019-02-05 DIAGNOSIS — Z992 Dependence on renal dialysis: Secondary | ICD-10-CM | POA: Diagnosis not present

## 2019-02-05 DIAGNOSIS — D509 Iron deficiency anemia, unspecified: Secondary | ICD-10-CM | POA: Diagnosis not present

## 2019-02-06 DIAGNOSIS — D509 Iron deficiency anemia, unspecified: Secondary | ICD-10-CM | POA: Diagnosis not present

## 2019-02-06 DIAGNOSIS — N186 End stage renal disease: Secondary | ICD-10-CM | POA: Diagnosis not present

## 2019-02-06 DIAGNOSIS — Z992 Dependence on renal dialysis: Secondary | ICD-10-CM | POA: Diagnosis not present

## 2019-02-06 DIAGNOSIS — D631 Anemia in chronic kidney disease: Secondary | ICD-10-CM | POA: Diagnosis not present

## 2019-02-07 DIAGNOSIS — Z992 Dependence on renal dialysis: Secondary | ICD-10-CM | POA: Diagnosis not present

## 2019-02-07 DIAGNOSIS — D509 Iron deficiency anemia, unspecified: Secondary | ICD-10-CM | POA: Diagnosis not present

## 2019-02-07 DIAGNOSIS — N186 End stage renal disease: Secondary | ICD-10-CM | POA: Diagnosis not present

## 2019-02-07 DIAGNOSIS — D631 Anemia in chronic kidney disease: Secondary | ICD-10-CM | POA: Diagnosis not present

## 2019-02-08 DIAGNOSIS — Z992 Dependence on renal dialysis: Secondary | ICD-10-CM | POA: Diagnosis not present

## 2019-02-08 DIAGNOSIS — D509 Iron deficiency anemia, unspecified: Secondary | ICD-10-CM | POA: Diagnosis not present

## 2019-02-08 DIAGNOSIS — N186 End stage renal disease: Secondary | ICD-10-CM | POA: Diagnosis not present

## 2019-02-08 DIAGNOSIS — D631 Anemia in chronic kidney disease: Secondary | ICD-10-CM | POA: Diagnosis not present

## 2019-02-09 DIAGNOSIS — D631 Anemia in chronic kidney disease: Secondary | ICD-10-CM | POA: Diagnosis not present

## 2019-02-09 DIAGNOSIS — N186 End stage renal disease: Secondary | ICD-10-CM | POA: Diagnosis not present

## 2019-02-09 DIAGNOSIS — D509 Iron deficiency anemia, unspecified: Secondary | ICD-10-CM | POA: Diagnosis not present

## 2019-02-09 DIAGNOSIS — Z992 Dependence on renal dialysis: Secondary | ICD-10-CM | POA: Diagnosis not present

## 2019-02-10 DIAGNOSIS — D509 Iron deficiency anemia, unspecified: Secondary | ICD-10-CM | POA: Diagnosis not present

## 2019-02-10 DIAGNOSIS — D631 Anemia in chronic kidney disease: Secondary | ICD-10-CM | POA: Diagnosis not present

## 2019-02-10 DIAGNOSIS — Z992 Dependence on renal dialysis: Secondary | ICD-10-CM | POA: Diagnosis not present

## 2019-02-10 DIAGNOSIS — N186 End stage renal disease: Secondary | ICD-10-CM | POA: Diagnosis not present

## 2019-02-11 DIAGNOSIS — D509 Iron deficiency anemia, unspecified: Secondary | ICD-10-CM | POA: Diagnosis not present

## 2019-02-11 DIAGNOSIS — N186 End stage renal disease: Secondary | ICD-10-CM | POA: Diagnosis not present

## 2019-02-11 DIAGNOSIS — Z992 Dependence on renal dialysis: Secondary | ICD-10-CM | POA: Diagnosis not present

## 2019-02-11 DIAGNOSIS — D631 Anemia in chronic kidney disease: Secondary | ICD-10-CM | POA: Diagnosis not present

## 2019-02-12 DIAGNOSIS — D509 Iron deficiency anemia, unspecified: Secondary | ICD-10-CM | POA: Diagnosis not present

## 2019-02-12 DIAGNOSIS — N186 End stage renal disease: Secondary | ICD-10-CM | POA: Diagnosis not present

## 2019-02-12 DIAGNOSIS — Z992 Dependence on renal dialysis: Secondary | ICD-10-CM | POA: Diagnosis not present

## 2019-02-12 DIAGNOSIS — D631 Anemia in chronic kidney disease: Secondary | ICD-10-CM | POA: Diagnosis not present

## 2019-02-13 DIAGNOSIS — Z992 Dependence on renal dialysis: Secondary | ICD-10-CM | POA: Diagnosis not present

## 2019-02-13 DIAGNOSIS — D631 Anemia in chronic kidney disease: Secondary | ICD-10-CM | POA: Diagnosis not present

## 2019-02-13 DIAGNOSIS — N186 End stage renal disease: Secondary | ICD-10-CM | POA: Diagnosis not present

## 2019-02-13 DIAGNOSIS — D509 Iron deficiency anemia, unspecified: Secondary | ICD-10-CM | POA: Diagnosis not present

## 2019-02-14 ENCOUNTER — Other Ambulatory Visit: Payer: Self-pay

## 2019-02-14 ENCOUNTER — Encounter: Payer: Self-pay | Admitting: "Endocrinology

## 2019-02-14 ENCOUNTER — Ambulatory Visit (INDEPENDENT_AMBULATORY_CARE_PROVIDER_SITE_OTHER): Payer: Medicare Other | Admitting: "Endocrinology

## 2019-02-14 VITALS — BP 170/96 | HR 71 | Ht 68.0 in | Wt 248.0 lb

## 2019-02-14 DIAGNOSIS — E1122 Type 2 diabetes mellitus with diabetic chronic kidney disease: Secondary | ICD-10-CM | POA: Diagnosis not present

## 2019-02-14 DIAGNOSIS — Z6837 Body mass index (BMI) 37.0-37.9, adult: Secondary | ICD-10-CM

## 2019-02-14 DIAGNOSIS — I639 Cerebral infarction, unspecified: Secondary | ICD-10-CM

## 2019-02-14 DIAGNOSIS — D509 Iron deficiency anemia, unspecified: Secondary | ICD-10-CM | POA: Diagnosis not present

## 2019-02-14 DIAGNOSIS — N186 End stage renal disease: Secondary | ICD-10-CM

## 2019-02-14 DIAGNOSIS — I10 Essential (primary) hypertension: Secondary | ICD-10-CM | POA: Insufficient documentation

## 2019-02-14 DIAGNOSIS — Z992 Dependence on renal dialysis: Secondary | ICD-10-CM | POA: Diagnosis not present

## 2019-02-14 DIAGNOSIS — D631 Anemia in chronic kidney disease: Secondary | ICD-10-CM | POA: Diagnosis not present

## 2019-02-14 NOTE — Progress Notes (Signed)
Endocrinology Consult Note       02/14/2019, 5:56 PM   Subjective:    Patient ID: Janet Mitchell, female    DOB: December 15, 1965.  Janet Mitchell is being seen in consultation for management of currently uncontrolled symptomatic diabetes requested by  Lucianne Lei, MD.   Past Medical History:  Diagnosis Date  . Anemia   . CHF (congestive heart failure) ([place])   . Chronic cholecystitis with calculus   . ESRD (end stage renal disease) on dialysis [facility])    "peritoneal dialysis q hs" (03/09/2018)  . Headache    "a few/wk" (03/09/2018)  . History of blood transfusion 10/2017   "low blood count" (03/09/2018)  . Hypertension   . Spinal headache   . Type II diabetes mellitus ([place])     Past Surgical History:  Procedure Laterality Date  . AMPUTATION TOE Left 2013   Great toe  . BIOPSY  10/24/2018   Procedure: BIOPSY;  Surgeon: Daneil Dolin, MD;  [facility];  Service: Endoscopy;;  right and left colon  . CATARACT EXTRACTION W/ INTRAOCULAR LENS IMPLANT Right   . CESAREAN SECTION  1994; 1998  . CHOLECYSTECTOMY N/A 03/09/2018   Procedure: LAPAROSCOPIC CHOLECYSTECTOMY WITH INTRAOPERATIVE CHOLANGIOGRAM ERAS PATHWAY;  Surgeon: Donnie Mesa, MD;  Location: [place];  Service: General;  Laterality: N/A;  . COLONOSCOPY N/A 10/24/2018   Procedure: COLONOSCOPY;  Surgeon: Daneil Dolin, MD;  [facility];  Service: Endoscopy;  Laterality: N/A;  1:00pm  . EYE SURGERY  05/15/2018   Removal of blood in the globe (due to DM)  . FLEXIBLE SIGMOIDOSCOPY N/A 11/24/2017   Procedure: FLEXIBLE SIGMOIDOSCOPY;  Surgeon: Daneil Dolin, MD;  [facility];  Service: Endoscopy;  Laterality: N/A;  . LAPAROSCOPIC CHOLECYSTECTOMY  03/09/2018  . PERITONEAL CATHETER INSERTION  2017  . POLYPECTOMY  10/24/2018   Procedure: POLYPECTOMY;  Surgeon: Daneil Dolin, MD;  [facility];   Service: Endoscopy;;  . TOTAL HIP ARTHROPLASTY Left 1997    Social History   Socioeconomic History  . Marital status: Married    Spouse name: Not on file  . Number of children: 2  . Years of education: Not on file  . Highest education level: Not on file  Occupational History  . Not on file  Social Needs  . Financial resource strain: Not on file  . Food insecurity    Worry: Not on file    Inability: Not on file  . Transportation needs    Medical: Not on file    Non-medical: Not on file  Tobacco Use  . Smoking status: Passive Smoke Exposure - Never Smoker  . Smokeless tobacco: Never Used  Substance and Sexual Activity  . Alcohol use: Never    Frequency: Never  . Drug use: Never  . Sexual activity: Yes    Birth control/protection: None  Lifestyle  . Physical activity    Days per week: Not on file    Minutes per session: Not on file  . Stress: Not on file  Relationships  . Social Herbalist on phone:  Not on file    Gets together: Not on file    Attends religious service: Not on file    Active member of club or organization: Not on file    Attends meetings of clubs or organizations: Not on file    Relationship status: Not on file  Other Topics Concern  . Not on file  Social History Narrative  . Not on file    Family History  Problem Relation Age of Onset  . Heart disease Mother   . Thrombocytopenia Mother        TTP  . Heart failure Father   . Kidney disease Paternal Grandfather   . Colon cancer Neg Hx     Outpatient Encounter Medications as of 02/14/2019  Medication Sig  . ALPRAZolam (XANAX) 0.5 MG tablet Take 0.5 mg by mouth at bedtime as needed for anxiety.  Marland Kitchen amLODipine (NORVASC) 10 MG tablet Take 1 tablet (10 mg total) by mouth daily.  . calcitRIOL (ROCALTROL) 0.5 MCG capsule Take 0.5-1 mcg by mouth See admin instructions. 0.77mcg on Mondays through Fridays and take 76mcg on Saturdays and Sundays  . doxazosin (CARDURA) 8 MG tablet Take 8 mg by  mouth at bedtime.  . Insulin Glargine, 2 Unit Dial, 300 UNIT/ML SOPN Inject 20 Units into the skin at bedtime.  Marland Kitchen labetalol (NORMODYNE) 200 MG tablet Take 200 mg by mouth 2 (two) times daily.  . [DISCONTINUED] Insulin Glargine (TOUJEO MAX SOLOSTAR) 300 UNIT/ML SOPN Inject 14 Units into the skin at bedtime.  Stasia Cavalier (EUCRISA) 2 % OINT Apply 1 application topically daily as needed (for rash/irritation).   No facility-administered encounter medications on file as of 02/14/2019.     ALLERGIES: No Known Allergies  VACCINATION STATUS:  There is no immunization history on file for this patient.  Diabetes She presents for her initial diabetic visit. She has type 2 diabetes mellitus. Onset time: She was diagnosed at approximate age of 82 years. Her disease course has been worsening. There are no hypoglycemic associated symptoms. Pertinent negatives for hypoglycemia include no confusion, headaches, pallor or seizures. There are no diabetic associated symptoms. Pertinent negatives for diabetes include no chest pain, no polydipsia, no polyphagia and no polyuria. There are no hypoglycemic complications. Symptoms are worsening. Diabetic complications include nephropathy and retinopathy. (She is currently on peritoneal dialysis.  On renal transplant list.) Risk factors for coronary artery disease include diabetes mellitus, hypertension, obesity and sedentary lifestyle. Current diabetic treatment includes insulin injections. Her weight is fluctuating minimally. She is following a generally unhealthy diet. When asked about meal planning, she reported none. She has had a previous visit with a dietitian. She rarely participates in exercise. (She did not bring any logs nor meter to review today.  Her most recent A1c was found to be 7.8% on January 03, 2019.) An ACE inhibitor/angiotensin II receptor blocker is being taken. Eye exam is current.  Hypertension This is a chronic problem. The current episode started  more than 1 year ago. The problem is uncontrolled. Pertinent negatives include no chest pain, headaches, palpitations or shortness of breath. Risk factors for coronary artery disease include obesity, diabetes mellitus, sedentary lifestyle and family history. Past treatments include calcium channel blockers, angiotensin blockers and direct vasodilators. Hypertensive end-organ damage includes kidney disease and retinopathy. Identifiable causes of hypertension include chronic renal disease.     Review of Systems  Constitutional: Negative for chills, fever and unexpected weight change.  HENT: Negative for trouble swallowing and voice change.   Eyes:  Negative for visual disturbance.  Respiratory: Negative for cough, shortness of breath and wheezing.   Cardiovascular: Negative for chest pain, palpitations and leg swelling.  Gastrointestinal: Negative for diarrhea, nausea and vomiting.  Endocrine: Negative for cold intolerance, heat intolerance, polydipsia, polyphagia and polyuria.  Musculoskeletal: Negative for arthralgias and myalgias.  Skin: Negative for color change, pallor, rash and wound.  Neurological: Negative for seizures and headaches.  Psychiatric/Behavioral: Negative for confusion and suicidal ideas.    Objective:    BP (!) 170/96   Pulse 71   Ht 5\' 8"  (1.727 m)   Wt 248 lb (112.5 kg)   BMI 37.71 kg/m   Wt Readings from Last 3 Encounters:  02/14/19 248 lb (112.5 kg)  12/19/18 225 lb 5 oz (102.2 kg)  11/28/18 242 lb 9.6 oz (110 kg)     Physical Exam Constitutional:      Appearance: She is well-developed.  HENT:     Head: Normocephalic and atraumatic.  Neck:     Musculoskeletal: Normal range of motion and neck supple.     Thyroid: No thyromegaly.     Trachea: No tracheal deviation.  Pulmonary:     Effort: Pulmonary effort is normal.  Abdominal:     Tenderness: There is no abdominal tenderness. There is no guarding.  Musculoskeletal: Normal range of motion.  Skin:     General: Skin is warm and dry.     Coloration: Skin is not pale.     Findings: No erythema or rash.  Neurological:     Mental Status: She is alert and oriented to person, place, and time.     Cranial Nerves: No cranial nerve deficit.     Coordination: Coordination normal.     Deep Tendon Reflexes: Reflexes are normal and symmetric.  Psychiatric:        Judgment: Judgment normal.     CMP ( most recent) CMP     Component Value Date/Time   NA 128 (L) 12/19/2018 0726   K 2.8 (L) 12/19/2018 0726   CL 87 (L) 12/19/2018 0726   CO2 27 12/19/2018 0726   GLUCOSE 95 12/19/2018 0726   BUN 47 (H) 12/19/2018 0726   CREATININE 10.48 (H) 12/19/2018 0726   CALCIUM 8.1 (L) 12/19/2018 0726   PROT 7.1 12/18/2018 0352   ALBUMIN 1.8 (L) 12/19/2018 0726   AST 14 (L) 12/18/2018 0352   ALT 14 12/18/2018 0352   ALKPHOS 106 12/18/2018 0352   BILITOT 0.7 12/18/2018 0352   GFRNONAA 4 (L) 12/19/2018 0726   GFRAA 4 (L) 12/19/2018 0726     Diabetic Labs (most recent): Lab Results  Component Value Date   HGBA1C 7.8 01/03/2019   HGBA1C 6.7 (H) 12/18/2018   HGBA1C 6.4 (H) 03/25/2018      Assessment & Plan:   1. Type 2 diabetes mellitus with ESRD (end-stage renal disease) ([place])  - Lindia Garms Mitchell has currently uncontrolled symptomatic type 2 DM since  53 years of age,  with most recent A1c of 7.8 % and increasing. Recent labs reviewed. - I had a long discussion with her about the progressive nature of diabetes and the pathology behind its complications. -her diabetes is complicated by end-stage renal disease on peritoneal dialysis, currently on renal transplant list, retinopathy, obesity/sedentary life, and she remains at a high risk for more acute and chronic complications which include CAD, CVA, CKD, retinopathy, and neuropathy. These are all discussed in detail with her.  - I have counseled her on diet  and  weight management  by adopting a carbohydrate restricted/protein rich diet. Patient is  encouraged to switch to  unprocessed or minimally processed     complex starch and increased protein intake (animal or plant source), fruits, and vegetables. -  she is advised to stick to a routine mealtimes to eat 3 meals  a day and avoid unnecessary snacks ( to snack only to correct hypoglycemia).   - she admits that there is a room for improvement in her food and drink choices. - Suggestion is made for her to avoid simple carbohydrates  from her diet including Cakes, Sweet Desserts, Ice Cream, Soda (diet and regular), Sweet Tea, Candies, Chips, Cookies, Store Bought Juices, Alcohol in Excess of  1-2 drinks a day, Artificial Sweeteners,  Coffee Creamer, and "Sugar-free" Products. This will help patient to have more stable blood glucose profile and potentially avoid unintended weight gain.  - she will be scheduled with Jearld Fenton, RDN, CDE for diabetes education.  - I have approached her with the following individualized plan to manage  her diabetes and patient agrees:   - she will continue to need insulin treatment in order for her to achieve and maintain control of diabetes to target.  I discussed and increase her Toujeo to 20  units daily at bedtime ,associated with strict monitoring of glucose 4 times a day-before meals and at bedtime. - she is warned not to take insulin without proper monitoring per orders. - Adjustment parameters are given to her for hypo and hyperglycemia in writing. - she is encouraged to call clinic for blood glucose levels less than 70 or above 200 mg /dl.   - she is not a candidate for metformin, SGLT2 inhibitors due to concurrent renal insufficiency.  - she will be considered for incretin therapy as appropriate next visit.  - Specific targets for  A1c;  LDL, HDL, Triglycerides, and  Waist Circumference were discussed with the patient.  2) Blood Pressure /Hypertension:  her blood pressure is  uncontrolled to target.   she is advised to continue her current  medications including losartan 10 mg p.o. daily, amlodipine 10 mg p.o. daily.  3) Lipids/Hyperlipidemia: No recent lipid panel to review.  She is not on statins.  She will be considered for fasting lipid profile on subsequent visits.    4)  Weight/Diet:  Body mass index is 37.71 kg/m.  -   clearly complicating her diabetes care.   she is  a candidate for weight loss. I discussed with her the fact that loss of 5 - 10% of her  current body weight will have the most impact on her diabetes management.  Exercise, and detailed carbohydrates information provided  -  detailed on discharge instructions.  5) Chronic Care/Health Maintenance:  -she  is on ARB  medications and  is encouraged to initiate and continue to follow up with Ophthalmology, Dentist,  Podiatrist at least yearly or according to recommendations, and advised to   stay away from smoking. I have recommended yearly flu vaccine and pneumonia vaccine at least every 5 years; moderate intensity exercise for up to 150 minutes weekly; and  sleep for at least 7 hours a day.  - she is  advised to maintain close follow up with Lucianne Lei, MD for primary care needs, as well as her other providers for optimal and coordinated care.  - Time spent with the patient: 45 minutes, of which >50% was spent in obtaining information about her symptoms, reviewing her previous labs/studies, evaluations,  and treatments, counseling her about her currently uncontrolled, complicated type 2 diabetes; hypertension, and developing plans for long term treatment based on the latest standards of care/guidelines.  Please refer to " Patient Self Inventory" in the Media  tab for reviewed elements of pertinent patient history.  Janet Mitchell participated in the discussions, expressed understanding, and voiced agreement with the above plans.  All questions were answered to her satisfaction. she is encouraged to contact clinic should she have any questions or concerns prior to  her return visit.  Follow up plan: - Return in about 10 days (around 02/24/2019) for Follow up with Meter and Logs Only - no Labs.  Glade Lloyd, MD [facility] [address] [place], [place] [address] Phone: [phone]  Fax: [phone]    02/14/2019, 5:56 PM  This note was partially dictated with voice recognition software. Similar sounding words can be transcribed inadequately or may not  be corrected upon review.

## 2019-02-14 NOTE — Patient Instructions (Signed)

## 2019-02-15 DIAGNOSIS — N186 End stage renal disease: Secondary | ICD-10-CM | POA: Diagnosis not present

## 2019-02-15 DIAGNOSIS — D509 Iron deficiency anemia, unspecified: Secondary | ICD-10-CM | POA: Diagnosis not present

## 2019-02-15 DIAGNOSIS — Z992 Dependence on renal dialysis: Secondary | ICD-10-CM | POA: Diagnosis not present

## 2019-02-15 DIAGNOSIS — D631 Anemia in chronic kidney disease: Secondary | ICD-10-CM | POA: Diagnosis not present

## 2019-02-16 DIAGNOSIS — Z992 Dependence on renal dialysis: Secondary | ICD-10-CM | POA: Diagnosis not present

## 2019-02-16 DIAGNOSIS — D509 Iron deficiency anemia, unspecified: Secondary | ICD-10-CM | POA: Diagnosis not present

## 2019-02-16 DIAGNOSIS — N186 End stage renal disease: Secondary | ICD-10-CM | POA: Diagnosis not present

## 2019-02-16 DIAGNOSIS — D631 Anemia in chronic kidney disease: Secondary | ICD-10-CM | POA: Diagnosis not present

## 2019-02-17 DIAGNOSIS — Z992 Dependence on renal dialysis: Secondary | ICD-10-CM | POA: Diagnosis not present

## 2019-02-17 DIAGNOSIS — N186 End stage renal disease: Secondary | ICD-10-CM | POA: Diagnosis not present

## 2019-02-17 DIAGNOSIS — D631 Anemia in chronic kidney disease: Secondary | ICD-10-CM | POA: Diagnosis not present

## 2019-02-17 DIAGNOSIS — D509 Iron deficiency anemia, unspecified: Secondary | ICD-10-CM | POA: Diagnosis not present

## 2019-02-18 DIAGNOSIS — D631 Anemia in chronic kidney disease: Secondary | ICD-10-CM | POA: Diagnosis not present

## 2019-02-18 DIAGNOSIS — N186 End stage renal disease: Secondary | ICD-10-CM | POA: Diagnosis not present

## 2019-02-18 DIAGNOSIS — Z992 Dependence on renal dialysis: Secondary | ICD-10-CM | POA: Diagnosis not present

## 2019-02-18 DIAGNOSIS — D509 Iron deficiency anemia, unspecified: Secondary | ICD-10-CM | POA: Diagnosis not present

## 2019-02-19 DIAGNOSIS — N186 End stage renal disease: Secondary | ICD-10-CM | POA: Diagnosis not present

## 2019-02-19 DIAGNOSIS — D509 Iron deficiency anemia, unspecified: Secondary | ICD-10-CM | POA: Diagnosis not present

## 2019-02-19 DIAGNOSIS — Z992 Dependence on renal dialysis: Secondary | ICD-10-CM | POA: Diagnosis not present

## 2019-02-19 DIAGNOSIS — D631 Anemia in chronic kidney disease: Secondary | ICD-10-CM | POA: Diagnosis not present

## 2019-02-20 DIAGNOSIS — Z992 Dependence on renal dialysis: Secondary | ICD-10-CM | POA: Diagnosis not present

## 2019-02-20 DIAGNOSIS — D631 Anemia in chronic kidney disease: Secondary | ICD-10-CM | POA: Diagnosis not present

## 2019-02-20 DIAGNOSIS — N186 End stage renal disease: Secondary | ICD-10-CM | POA: Diagnosis not present

## 2019-02-20 DIAGNOSIS — D509 Iron deficiency anemia, unspecified: Secondary | ICD-10-CM | POA: Diagnosis not present

## 2019-02-21 DIAGNOSIS — D509 Iron deficiency anemia, unspecified: Secondary | ICD-10-CM | POA: Diagnosis not present

## 2019-02-21 DIAGNOSIS — Z992 Dependence on renal dialysis: Secondary | ICD-10-CM | POA: Diagnosis not present

## 2019-02-21 DIAGNOSIS — N186 End stage renal disease: Secondary | ICD-10-CM | POA: Diagnosis not present

## 2019-02-21 DIAGNOSIS — D631 Anemia in chronic kidney disease: Secondary | ICD-10-CM | POA: Diagnosis not present

## 2019-02-22 DIAGNOSIS — D631 Anemia in chronic kidney disease: Secondary | ICD-10-CM | POA: Diagnosis not present

## 2019-02-22 DIAGNOSIS — Z992 Dependence on renal dialysis: Secondary | ICD-10-CM | POA: Diagnosis not present

## 2019-02-22 DIAGNOSIS — N186 End stage renal disease: Secondary | ICD-10-CM | POA: Diagnosis not present

## 2019-02-22 DIAGNOSIS — D509 Iron deficiency anemia, unspecified: Secondary | ICD-10-CM | POA: Diagnosis not present

## 2019-02-23 DIAGNOSIS — D509 Iron deficiency anemia, unspecified: Secondary | ICD-10-CM | POA: Diagnosis not present

## 2019-02-23 DIAGNOSIS — Z992 Dependence on renal dialysis: Secondary | ICD-10-CM | POA: Diagnosis not present

## 2019-02-23 DIAGNOSIS — N186 End stage renal disease: Secondary | ICD-10-CM | POA: Diagnosis not present

## 2019-02-23 DIAGNOSIS — D631 Anemia in chronic kidney disease: Secondary | ICD-10-CM | POA: Diagnosis not present

## 2019-02-24 DIAGNOSIS — D631 Anemia in chronic kidney disease: Secondary | ICD-10-CM | POA: Diagnosis not present

## 2019-02-24 DIAGNOSIS — Z992 Dependence on renal dialysis: Secondary | ICD-10-CM | POA: Diagnosis not present

## 2019-02-24 DIAGNOSIS — D509 Iron deficiency anemia, unspecified: Secondary | ICD-10-CM | POA: Diagnosis not present

## 2019-02-24 DIAGNOSIS — N186 End stage renal disease: Secondary | ICD-10-CM | POA: Diagnosis not present

## 2019-02-25 DIAGNOSIS — D631 Anemia in chronic kidney disease: Secondary | ICD-10-CM | POA: Diagnosis not present

## 2019-02-25 DIAGNOSIS — I10 Essential (primary) hypertension: Secondary | ICD-10-CM | POA: Diagnosis not present

## 2019-02-25 DIAGNOSIS — E1169 Type 2 diabetes mellitus with other specified complication: Secondary | ICD-10-CM | POA: Diagnosis not present

## 2019-02-25 DIAGNOSIS — Z992 Dependence on renal dialysis: Secondary | ICD-10-CM | POA: Diagnosis not present

## 2019-02-25 DIAGNOSIS — N186 End stage renal disease: Secondary | ICD-10-CM | POA: Diagnosis not present

## 2019-02-25 DIAGNOSIS — D509 Iron deficiency anemia, unspecified: Secondary | ICD-10-CM | POA: Diagnosis not present

## 2019-02-27 ENCOUNTER — Ambulatory Visit (INDEPENDENT_AMBULATORY_CARE_PROVIDER_SITE_OTHER): Payer: Medicare Other | Admitting: Nurse Practitioner

## 2019-02-27 ENCOUNTER — Encounter: Payer: Self-pay | Admitting: Nurse Practitioner

## 2019-02-27 ENCOUNTER — Other Ambulatory Visit: Payer: Self-pay

## 2019-02-27 VITALS — BP 187/95 | HR 75 | Temp 96.2°F | Ht 68.0 in | Wt 244.4 lb

## 2019-02-27 DIAGNOSIS — R197 Diarrhea, unspecified: Secondary | ICD-10-CM

## 2019-02-27 DIAGNOSIS — R1030 Lower abdominal pain, unspecified: Secondary | ICD-10-CM

## 2019-02-27 DIAGNOSIS — I639 Cerebral infarction, unspecified: Secondary | ICD-10-CM

## 2019-02-27 NOTE — Assessment & Plan Note (Addendum)
Abdominal pain resolved at this time.  Recommend she continue to monitor and notify us of any worsening abdominal pain.  Follow-up in 2 months.

## 2019-02-27 NOTE — Progress Notes (Signed)
Referring Provider: Lucianne Lei, MD Primary Care Physician:  Lucianne Lei, MD Primary GI:  Dr. Gala Romney  Chief Complaint  Patient presents with  . Diarrhea    HPI:   Janet Mitchell is a 53 y.o. female who presents for follow-up on diarrhea and abdominal pain.  The patient was last seen in our office 11/28/2018 for the same.  Past medical history significant for GERD, ESRD on peritoneal dialysis, anemia, type 2 diabetes, CHF.  Status post cholecystectomy in December 2019 and colonoscopy follow-up of poor prep and was rescheduled for 2020.  Pursuing a kidney transplant and will start retesting soon.  Recommended trial of cholestyramine at 1.4 g daily, Imodium as needed, Anusol rectal cream.  Cholestyramine was changed to colestipol due to possible renal impairment dosing.  Repeat colonoscopy 10/24/2018 which found a 15 mm polyp in the proximal transverse colon, otherwise normal.  Status post random colon biopsies.  Surgical pathology found the polyp to be tubular adenoma and the biopsies to be negative for histopathological changes.  Recommended repeat colonoscopy in 3 years (2023).  At her last visit she stated colestipol did help her diarrhea but when she took it regularly it caused abdominal cramping so she stopped.  Still has postprandial diarrhea 5 to 10 minutes after eating and number of stools depends upon how often she eats.  No other antidiarrheals other than Imodium which was not very effective.  Has also tried Tums with every meal for phosphorus which is not resulted in improved stool consistency.  Abdominal pain is improved but still somewhat persistent.  No other overt GI complaints.  Still undergoing transplant process and work-up with [facility].  Recommended stop colestipol, trial of Bentyl 5 mg 10 minutes before meals and before bed (renal dosing).  Call in 2 weeks with progress report.  Follow-up in 3 months.  Bentyl was not effective when she  called our office 12/17/2018.  Query if Levsin is acceptable in end-stage renal disease on hemodialysis.  A message was sent to her nephrologist but no response as of yet.  Today she states she's doing ok overall. Abdominal pain is improved, not really a problem at this time. Still with daily diarrhea, her nephrologist is now Dr. Candiss Norse in [place], [place]. She is near the top of the kidney transplant list. Her BP is a bit elevated today, was told by nephrology this is related to her potassium level and recent BP medication changes. All bloodwork related to transplant looks good. She is hopeful. 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 Her goal is $1000 and she's $925. Has a bowel movement every time she eats, typically 2 times a day. Stools consistent with Bristol 6-7. Stools are orange and "looks like oil." Denies abdominal pain, N/V, hematochezia, melena, fever, chills, unintentional weight loss. Denies URI or flu-like symptoms. Denies loss of sense of taste or smell. Denies chest pain, dyspnea, dizziness, lightheadedness, syncope, near syncope. Denies any other upper or lower GI symptoms.  Past Medical History:  Diagnosis Date  . Anemia   . CHF (congestive heart failure) ([place])   . Chronic cholecystitis with calculus   . ESRD (end stage renal disease) on dialysis [facility])    "peritoneal dialysis q hs" (03/09/2018)  . Headache    "a few/wk" (03/09/2018)  . History of blood transfusion 10/2017   "low blood count" (03/09/2018)  . Hypertension   . Spinal headache   .  Type II diabetes mellitus ([place])     Past Surgical History:  Procedure Laterality Date  . AMPUTATION TOE Left 2013   Great toe  . BIOPSY  10/24/2018   Procedure: BIOPSY;  Surgeon: Daneil Dolin, MD;  [facility];  Service: Endoscopy;;  right and left colon  . CATARACT EXTRACTION W/ INTRAOCULAR LENS IMPLANT Right   . CESAREAN SECTION  1994; 1998  .  CHOLECYSTECTOMY N/A 03/09/2018   Procedure: LAPAROSCOPIC CHOLECYSTECTOMY WITH INTRAOPERATIVE CHOLANGIOGRAM ERAS PATHWAY;  Surgeon: Donnie Mesa, MD;  Location: [facility];  Service: General;  Laterality: N/A;  . COLONOSCOPY N/A 10/24/2018   Procedure: COLONOSCOPY;  Surgeon: Daneil Dolin, MD;  Location: [facility];  Service: Endoscopy;  Laterality: N/A;  1:00pm  . EYE SURGERY  05/15/2018   Removal of blood in the globe (due to DM)  . FLEXIBLE SIGMOIDOSCOPY N/A 11/24/2017   Procedure: FLEXIBLE SIGMOIDOSCOPY;  Surgeon: Daneil Dolin, MD;  [facility];  Service: Endoscopy;  Laterality: N/A;  . LAPAROSCOPIC CHOLECYSTECTOMY  03/09/2018  . PERITONEAL CATHETER INSERTION  2017  . POLYPECTOMY  10/24/2018   Procedure: POLYPECTOMY;  Surgeon: Daneil Dolin, MD;  [facility];  Service: Endoscopy;;  . TOTAL HIP ARTHROPLASTY Left 1997    Current Outpatient Medications  Medication Sig Dispense Refill  . ALPRAZolam (XANAX) 0.5 MG tablet Take 0.5 mg by mouth at bedtime as needed for anxiety.    Marland Kitchen amLODipine (NORVASC) 10 MG tablet Take 1 tablet (10 mg total) by mouth daily. 30 tablet 3  . calcitRIOL (ROCALTROL) 0.5 MCG capsule Take 0.5-1 mcg by mouth See admin instructions. 0.61mcg on Mondays through Fridays and take 50mcg on Saturdays and Sundays    . Crisaborole (EUCRISA) 2 % OINT Apply 1 application topically daily as needed (for rash/irritation).    Marland Kitchen doxazosin (CARDURA) 8 MG tablet Take 8 mg by mouth at bedtime.    . Insulin Glargine, 2 Unit Dial, 300 UNIT/ML SOPN Inject 20 Units into the skin at bedtime.    Marland Kitchen labetalol (NORMODYNE) 200 MG tablet Take 200 mg by mouth 2 (two) times daily.    Marland Kitchen losartan (COZAAR) 100 MG tablet Take 100 mg by mouth daily.     No current facility-administered medications for this visit.     Allergies as of 02/27/2019  . (No Known Allergies)    Family History  Problem Relation Age of Onset  . Heart disease Mother   . Thrombocytopenia Mother         TTP  . Heart failure Father   . Kidney disease Paternal Grandfather   . Colon cancer Neg Hx     Social History   Socioeconomic History  . Marital status: Married    Spouse name: Not on file  . Number of children: 2  . Years of education: Not on file  . Highest education level: Not on file  Occupational History  . Not on file  Social Needs  . Financial resource strain: Not on file  . Food insecurity    Worry: Not on file    Inability: Not on file  . Transportation needs    Medical: Not on file    Non-medical: Not on file  Tobacco Use  . Smoking status: Passive Smoke Exposure - Never Smoker  . Smokeless tobacco: Never Used  Substance and Sexual Activity  . Alcohol use: Never    Frequency: Never  . Drug use: Never  . Sexual activity: Yes  Birth control/protection: None  Lifestyle  . Physical activity    Days per week: Not on file    Minutes per session: Not on file  . Stress: Not on file  Relationships  . Social Herbalist on phone: Not on file    Gets together: Not on file    Attends religious service: Not on file    Active member of club or organization: Not on file    Attends meetings of clubs or organizations: Not on file    Relationship status: Not on file  Other Topics Concern  . Not on file  Social History Narrative  . Not on file    Review of Systems: General: Negative for anorexia, weight loss, fever, chills, fatigue, weakness. Eyes: Negative for vision changes.  ENT: Negative for hoarseness, difficulty swallowing , nasal congestion. CV: Negative for chest pain, angina, palpitations, dyspnea on exertion, peripheral edema.  Respiratory: Negative for dyspnea at rest, dyspnea on exertion, cough, sputum, wheezing.  GI: See history of present illness. GU:  Negative for dysuria, hematuria, urinary incontinence, urinary frequency, nocturnal urination.  MS: Negative for joint pain, low back pain.  Derm: Negative for rash or itching.   Neuro: Negative for weakness, abnormal sensation, seizure, frequent headaches, memory loss, confusion.  Psych: Negative for anxiety, depression, suicidal ideation, hallucinations.  Endo: Negative for unusual weight change.  Heme: Negative for bruising or bleeding. Allergy: Negative for rash or hives.   Physical Exam: BP (!) 187/95   Pulse 75   Temp (!) 96.2 F (35.7 C) (Temporal)   Ht 5\' 8"  (1.727 m)   Wt 244 lb 6.4 oz (110.9 kg)   BMI 37.16 kg/m  General:   Alert and oriented. Pleasant and cooperative. Well-nourished and well-developed.  Head:  Normocephalic and atraumatic. Eyes:  Without icterus, sclera clear and conjunctiva pink.  Ears:  Normal auditory acuity. Mouth:  No deformity or lesions, oral mucosa pink.  Throat/Neck:  Supple, without mass or thyromegaly. Cardiovascular:  S1, S2 present without murmurs appreciated. Normal pulses noted. Extremities without clubbing or edema. Respiratory:  Clear to auscultation bilaterally. No wheezes, rales, or rhonchi. No distress.  Gastrointestinal:  +BS, soft, non-tender and non-distended. No HSM noted. No guarding or rebound. No masses appreciated.  Rectal:  Deferred  Musculoskalatal:  Symmetrical without gross deformities. Normal posture. Skin:  Intact without significant lesions or rashes. Neurologic:  Alert and oriented x4;  grossly normal neurologically. Psych:  Alert and cooperative. Normal mood and affect. Heme/Lymph/Immune: No significant cervical adenopathy. No excessive bruising noted.    02/27/2019 2:12 PM   Disclaimer: This note was dictated with voice recognition software. Similar sounding words can inadvertently be transcribed and may not be corrected upon review.

## 2019-02-27 NOTE — Patient Instructions (Signed)
Your health issues we discussed today were:   Abdominal pain: 1. I am glad your abdominal pain is better! 2. Call us if you have any worsening abdominal pain  Persistent diarrhea: 1. We will reach out to your kidney doctor to discuss possible medication options for your diarrhea 2. We will call you when we hear back from them 3. Call us if you have any worsening or severe symptoms 4. Keep in mind, we may need to do stool studies or other tests to help figure out a specific cause if these medications do not help  Overall I recommend:  1. Continue your other current medications 2. Return for follow-up in 2 months 3. Call us if you have any questions or concerns.   Because of recent events of COVID-19 ("Coronavirus"), follow CDC recommendations:  1. Wash your hand frequently 2. Avoid touching your face 3. Stay away from people who are sick 4. If you have symptoms such as fever, cough, shortness of breath then call your healthcare provider for further guidance 5. If you are sick, STAY AT HOME unless otherwise directed by your healthcare provider. 6. Follow directions from state and national officials regarding staying safe   At [facility] we value your feedback. You may receive a survey about your visit today. Please share your experience as we strive to create trusting relationships with our patients to provide genuine, compassionate, quality care.  We appreciate your understanding and patience as we review any laboratory studies, imaging, and other diagnostic tests that are ordered as we care for you. Our office policy is 5 business days for review of these results, and any emergent or urgent results are addressed in a timely manner for your best interest. If you do not hear from our office in 1 week, please contact us.   We also encourage the use of MyChart, which contains your medical information for your review as well. If you are not enrolled in this feature, an  access code is on this after visit summary for your convenience. Thank you for allowing [place] to be involved in your care.  It was great to see you today!  I hope you have a Merry Christmas and Happy Holidays!!

## 2019-02-27 NOTE — Progress Notes (Signed)
cc'ed to pcp °

## 2019-02-27 NOTE — Assessment & Plan Note (Signed)
Persistent diarrhea.  Typically postprandial, about twice daily.  Bristol 6-7.  Stools described as orange and "oily".  Previously tried and failed Imodium, Bentyl.  We will reach out to nephrology to discuss other options and appropriate dosing in the setting of end-stage renal disease on peritoneal dialysis.  We would like to consider Levsin and/or Creon.  She may need stool studies in the future to check for fecal fat and other possible etiologies.  Follow-up in 2 months.

## 2019-02-28 ENCOUNTER — Ambulatory Visit: Payer: Medicare Other | Admitting: Podiatry

## 2019-02-28 ENCOUNTER — Encounter: Payer: Self-pay | Admitting: "Endocrinology

## 2019-02-28 ENCOUNTER — Ambulatory Visit (INDEPENDENT_AMBULATORY_CARE_PROVIDER_SITE_OTHER): Payer: Medicare Other | Admitting: "Endocrinology

## 2019-02-28 DIAGNOSIS — I639 Cerebral infarction, unspecified: Secondary | ICD-10-CM

## 2019-02-28 DIAGNOSIS — Z6837 Body mass index (BMI) 37.0-37.9, adult: Secondary | ICD-10-CM

## 2019-02-28 DIAGNOSIS — E1122 Type 2 diabetes mellitus with diabetic chronic kidney disease: Secondary | ICD-10-CM

## 2019-02-28 DIAGNOSIS — N186 End stage renal disease: Secondary | ICD-10-CM | POA: Diagnosis not present

## 2019-02-28 DIAGNOSIS — I10 Essential (primary) hypertension: Secondary | ICD-10-CM

## 2019-02-28 MED ORDER — ACCU-CHEK GUIDE W/DEVICE KIT: 1.0000 | PACK | 0 refills | Status: DC

## 2019-02-28 MED ORDER — INSULIN GLARGINE (2 UNIT DIAL) 300 UNIT/ML ~~LOC~~ SOPN: 20.0000 [IU] | PEN_INJECTOR | Freq: Every day | SUBCUTANEOUS | 2 refills | Status: DC

## 2019-02-28 MED ORDER — ACCU-CHEK GUIDE VI STRP: ORAL_STRIP | 2 refills | Status: DC

## 2019-02-28 NOTE — Progress Notes (Signed)
Endocrinology Consult Note       02/28/2019, 10:21 AM   Subjective:    Patient ID: Janet Mitchell, female    DOB: Mar 20, 1966.  Janet Mitchell is being seen in consultation for management of currently uncontrolled symptomatic diabetes requested by  Lucianne Lei, MD.   Past Medical History:  Diagnosis Date  . Anemia   . CHF (congestive heart failure) ([place])   . Chronic cholecystitis with calculus   . ESRD (end stage renal disease) on dialysis [facility])    "peritoneal dialysis q hs" (03/09/2018)  . Headache    "a few/wk" (03/09/2018)  . History of blood transfusion 10/2017   "low blood count" (03/09/2018)  . Hypertension   . Spinal headache   . Type II diabetes mellitus ([place])     Past Surgical History:  Procedure Laterality Date  . AMPUTATION TOE Left 2013   Great toe  . BIOPSY  10/24/2018   Procedure: BIOPSY;  Surgeon: Daneil Dolin, MD;  [facility];  Service: Endoscopy;;  right and left colon  . CATARACT EXTRACTION W/ INTRAOCULAR LENS IMPLANT Right   . CESAREAN SECTION  1994; 1998  . CHOLECYSTECTOMY N/A 03/09/2018   Procedure: LAPAROSCOPIC CHOLECYSTECTOMY WITH INTRAOPERATIVE CHOLANGIOGRAM ERAS PATHWAY;  Surgeon: Donnie Mesa, MD;  Location: [facility];  Service: General;  Laterality: N/A;  . COLONOSCOPY N/A 10/24/2018   Procedure: COLONOSCOPY;  Surgeon: Daneil Dolin, MD;  [facility];  Service: Endoscopy;  Laterality: N/A;  1:00pm  . EYE SURGERY  05/15/2018   Removal of blood in the globe (due to DM)  . FLEXIBLE SIGMOIDOSCOPY N/A 11/24/2017   Procedure: FLEXIBLE SIGMOIDOSCOPY;  Surgeon: Daneil Dolin, MD;  [facility];  Service: Endoscopy;  Laterality: N/A;  . LAPAROSCOPIC CHOLECYSTECTOMY  03/09/2018  . PERITONEAL CATHETER INSERTION  2017  . POLYPECTOMY  10/24/2018   Procedure: POLYPECTOMY;  Surgeon: Daneil Dolin, MD;  [facility];   Service: Endoscopy;;  . TOTAL HIP ARTHROPLASTY Left 1997    Social History   Socioeconomic History  . Marital status: Married    Spouse name: Not on file  . Number of children: 2  . Years of education: Not on file  . Highest education level: Not on file  Occupational History  . Not on file  Social Needs  . Financial resource strain: Not on file  . Food insecurity    Worry: Not on file    Inability: Not on file  . Transportation needs    Medical: Not on file    Non-medical: Not on file  Tobacco Use  . Smoking status: Passive Smoke Exposure - Never Smoker  . Smokeless tobacco: Never Used  Substance and Sexual Activity  . Alcohol use: Never    Frequency: Never  . Drug use: Never  . Sexual activity: Yes    Birth control/protection: None  Lifestyle  . Physical activity    Days per week: Not on file    Minutes per session: Not on file  . Stress: Not on file  Relationships  . Social Herbalist on phone:  Not on file    Gets together: Not on file    Attends religious service: Not on file    Active member of club or organization: Not on file    Attends meetings of clubs or organizations: Not on file    Relationship status: Not on file  Other Topics Concern  . Not on file  Social History Narrative  . Not on file    Family History  Problem Relation Age of Onset  . Heart disease Mother   . Thrombocytopenia Mother        TTP  . Heart failure Father   . Kidney disease Paternal Grandfather   . Colon cancer Neg Hx     Outpatient Encounter Medications as of 02/28/2019  Medication Sig  . ALPRAZolam (XANAX) 0.5 MG tablet Take 0.5 mg by mouth at bedtime as needed for anxiety.  Marland Kitchen amLODipine (NORVASC) 10 MG tablet Take 1 tablet (10 mg total) by mouth daily.  . Blood Glucose Monitoring Suppl (ACCU-CHEK GUIDE) w/Device KIT 1 Piece by Does not apply route as directed.  . calcitRIOL (ROCALTROL) 0.5 MCG capsule Take 0.5-1 mcg by mouth See admin instructions. 0.64mg on  Mondays through Fridays and take 174m on Saturdays and Sundays  . Crisaborole (EUCRISA) 2 % OINT Apply 1 application topically daily as needed (for rash/irritation).  . Marland Kitchenoxazosin (CARDURA) 8 MG tablet Take 8 mg by mouth at bedtime.  . Marland Kitchenlucose blood (ACCU-CHEK GUIDE) test strip Use as instructed  . Insulin Glargine, 2 Unit Dial, 300 UNIT/ML SOPN Inject 20 Units into the skin at bedtime.  . Marland Kitchenabetalol (NORMODYNE) 200 MG tablet Take 200 mg by mouth 2 (two) times daily.  . Marland Kitchenosartan (COZAAR) 100 MG tablet Take 100 mg by mouth daily.  . [DISCONTINUED] Insulin Glargine, 2 Unit Dial, 300 UNIT/ML SOPN Inject 20 Units into the skin at bedtime.   No facility-administered encounter medications on file as of 02/28/2019.     ALLERGIES: No Known Allergies  VACCINATION STATUS:  There is no immunization history on file for this patient.  Diabetes She presents for her follow-up diabetic visit. She has type 2 diabetes mellitus. Onset time: She was diagnosed at approximate age of [age]. Her disease course has been worsening. There are no hypoglycemic associated symptoms. Pertinent negatives for hypoglycemia include no confusion, headaches, pallor or seizures. There are no diabetic associated symptoms. Pertinent negatives for diabetes include no chest pain, no polydipsia, no polyphagia and no polyuria. There are no hypoglycemic complications. Symptoms are improving. Diabetic complications include nephropathy and retinopathy. (She is currently on peritoneal dialysis.  On renal transplant list.) Risk factors for coronary artery disease include diabetes mellitus, hypertension, obesity and sedentary lifestyle. Current diabetic treatment includes insulin injections. Her weight is fluctuating minimally. She is following a generally unhealthy diet. When asked about meal planning, she reported none. She has had a previous visit with a dietitian. She rarely participates in exercise. Her home blood glucose trend is  fluctuating minimally. Her breakfast blood glucose range is generally 140-180 mg/dl. Her bedtime blood glucose range is generally 140-180 mg/dl. Her overall blood glucose range is 140-180 mg/dl. An ACE inhibitor/angiotensin II receptor blocker is being taken. Eye exam is current.  Hypertension This is a chronic problem. The current episode started more than 1 year ago. The problem is uncontrolled. Pertinent negatives include no chest pain, headaches, palpitations or shortness of breath. Risk factors for coronary artery disease include obesity, diabetes mellitus, sedentary lifestyle and family history. Past treatments include calcium  channel blockers, angiotensin blockers and direct vasodilators. Hypertensive end-organ damage includes kidney disease and retinopathy. Identifiable causes of hypertension include chronic renal disease.   Review of systems: Limited as above.  Patient doing peritoneal dialysis.    Objective:    There were no vitals taken for this visit.  Wt Readings from Last 3 Encounters:  02/27/19 244 lb 6.4 oz (110.9 kg)  02/14/19 248 lb (112.5 kg)  12/19/18 225 lb 5 oz (102.2 kg)      CMP ( most recent) CMP     Component Value Date/Time   NA 128 (L) 12/19/2018 0726   K 2.8 (L) 12/19/2018 0726   CL 87 (L) 12/19/2018 0726   CO2 27 12/19/2018 0726   GLUCOSE 95 12/19/2018 0726   BUN 47 (H) 12/19/2018 0726   CREATININE 10.48 (H) 12/19/2018 0726   CALCIUM 8.1 (L) 12/19/2018 0726   PROT 7.1 12/18/2018 0352   ALBUMIN 1.8 (L) 12/19/2018 0726   AST 14 (L) 12/18/2018 0352   ALT 14 12/18/2018 0352   ALKPHOS 106 12/18/2018 0352   BILITOT 0.7 12/18/2018 0352   GFRNONAA 4 (L) 12/19/2018 0726   GFRAA 4 (L) 12/19/2018 0726     Diabetic Labs (most recent): Lab Results  Component Value Date   HGBA1C 7.8 01/03/2019   HGBA1C 6.7 (H) 12/18/2018   HGBA1C 6.4 (H) 03/25/2018      Assessment & Plan:   1. Type 2 diabetes mellitus with ESRD (end-stage renal disease) ([place])  -  Adler Alton Mitchell has currently uncontrolled symptomatic type 2 DM since  53 years of age. -She reports controlled fasting and bedtime blood glucose profile, no hypoglycemia reported.  Her most recent A1c was 7.8%.   Recent labs reviewed. - I had a long discussion with her about the progressive nature of diabetes and the pathology behind its complications. -her diabetes is complicated by end-stage renal disease on peritoneal dialysis, currently on renal transplant list, retinopathy, obesity/sedentary life, and she remains at a high risk for more acute and chronic complications which include CAD, CVA, CKD, retinopathy, and neuropathy. These are all discussed in detail with her.  - I have counseled her on diet  and weight management  by adopting a carbohydrate restricted/protein rich diet. Patient is encouraged to switch to  unprocessed or minimally processed     complex starch and increased protein intake (animal or plant source), fruits, and vegetables. -  she is advised to stick to a routine mealtimes to eat 3 meals  a day and avoid unnecessary snacks ( to snack only to correct hypoglycemia).   - she  admits there is a room for improvement in her diet and drink choices. -  Suggestion is made for her to avoid simple carbohydrates  from her diet including Cakes, Sweet Desserts / Pastries, Ice Cream, Soda (diet and regular), Sweet Tea, Candies, Chips, Cookies, Sweet Pastries,  Store Bought Juices, Alcohol in Excess of  1-2 drinks a day, Artificial Sweeteners, Coffee Creamer, and "Sugar-free" Products. This will help patient to have stable blood glucose profile and potentially avoid unintended weight gain.   - she will be scheduled with Jearld Fenton, RDN, CDE for diabetes education.  - I have approached her with the following individualized plan to manage  her diabetes and patient agrees:   - she will continue to need insulin treatment in order for her to achieve and maintain control of diabetes to  target.  Based on her reported glycemic profile, she will not need  prandial insulin.  She is advised to continue Toujeo 20 units nightly, associated with strict monitoring of glucose 2 times a day-before breakfast and at bedtime.   - she is warned not to take insulin without proper monitoring per orders. - Adjustment parameters are given to her for hypo and hyperglycemia in writing. - she is encouraged to call clinic for blood glucose levels less than 70 or above 200 mg /dl.  - she is not a candidate for metformin, SGLT2 inhibitors due to concurrent renal insufficiency.  - she will be considered for incretin therapy as appropriate next visit.  - Specific targets for  A1c;  LDL, HDL, Triglycerides, and  Waist Circumference were discussed with the patient.  2) Blood Pressure /Hypertension:  her blood pressure is uncontrolled to target.   she is advised to continue her current medications including losartan 10 mg daily, amlodipine 10 mg p.o. daily.    3) Lipids/Hyperlipidemia: No recent lipid panel to review.  She is not on statins.  She will be considered for fasting lipid profile on subsequent visits.    4)  Weight/Diet: Her BMI is 37--   clearly complicating her diabetes care.   she is  a candidate for weight loss. I discussed with her the fact that loss of 5 - 10% of her  current body weight will have the most impact on her diabetes management.  Exercise, and detailed carbohydrates information provided  -  detailed on discharge instructions.  5) Chronic Care/Health Maintenance:  -she  is on ARB  medications and  is encouraged to initiate and continue to follow up with Ophthalmology, Dentist,  Podiatrist at least yearly or according to recommendations, and advised to   stay away from smoking. I have recommended yearly flu vaccine and pneumonia vaccine at least every 5 years; moderate intensity exercise for up to 150 minutes weekly; and  sleep for at least 7 hours a day.  - she is  advised to  maintain close follow up with Lucianne Lei, MD for primary care needs, as well as her other providers for optimal and coordinated care.  - Patient Care Time Today:  25 min, of which >50% was spent in  counseling and the rest reviewing her  current and  previous labs/studies, previous treatments, her blood glucose readings, and medications' doses and developing a plan for long-term care based on the latest recommendations for standards of care.   Janet Mitchell participated in the discussions, expressed understanding, and voiced agreement with the above plans.  All questions were answered to her satisfaction. she is encouraged to contact clinic should she have any questions or concerns prior to her return visit.   Follow up plan: - Return in about 3 months (around 05/29/2019) for Bring Meter and Logs- A1c in Office, Include 8 log sheets.  Glade Lloyd, MD [facility] [address] [place], [place] [address] Phone: [phone]  Fax: [phone]    02/28/2019, 10:21 AM  This note was partially dictated with voice recognition software. Similar sounding words can be transcribed inadequately or may not  be corrected upon review.

## 2019-03-01 DIAGNOSIS — Z992 Dependence on renal dialysis: Secondary | ICD-10-CM | POA: Diagnosis not present

## 2019-03-05 ENCOUNTER — Other Ambulatory Visit: Payer: Self-pay

## 2019-03-05 DIAGNOSIS — Z20828 Contact with and (suspected) exposure to other viral communicable diseases: Secondary | ICD-10-CM | POA: Diagnosis not present

## 2019-03-05 DIAGNOSIS — Z20822 Contact with and (suspected) exposure to covid-19: Secondary | ICD-10-CM

## 2019-03-08 ENCOUNTER — Telehealth: Payer: Self-pay

## 2019-03-08 LAB — NOVEL CORONAVIRUS, NAA: SARS-CoV-2, NAA: NOT DETECTED

## 2019-03-08 NOTE — Telephone Encounter (Signed)
Pt. Checking on COVID 19 results, not available yet. °

## 2019-03-18 ENCOUNTER — Telehealth: Payer: Self-pay | Admitting: General Practice

## 2019-03-18 NOTE — Telephone Encounter (Signed)
Patient wanted to follow-up form her 12/2 ov.  She stated you all discussed starting her on something for diarrhea, however you wanted to speak with her nephrologist first.  Routing to [facility] for follow-up  The patient can be reached at [phone].

## 2019-03-19 MED ORDER — HYOSCYAMINE SULFATE SL 0.125 MG SL SUBL: 0.1250 mg | SUBLINGUAL_TABLET | Freq: Three times a day (TID) | SUBLINGUAL | 3 refills | Status: DC | PRN

## 2019-03-19 NOTE — Addendum Note (Signed)
Addended by: Gordy Levan, Selisa Tensley A on: 03/19/2019 04:27 PM   Modules accepted: Orders

## 2019-03-19 NOTE — Telephone Encounter (Signed)
I got a message back from nephrology and Levsin is safe with peritoneal dialysis. Rx sent to pharmacy: 0.125 mg SL tid prn diarrhea.  Please notify the patient.

## 2019-03-20 NOTE — Telephone Encounter (Signed)
I spoke with the patient and she was made aware of Rx and recommendations

## 2019-03-25 ENCOUNTER — Ambulatory Visit
Admission: EM | Admit: 2019-03-25 | Discharge: 2019-03-25 | Disposition: A | Discharge status: Home / Self Care | Payer: Medicare Other | Source: Home / Self Care

## 2019-03-25 ENCOUNTER — Inpatient Hospital Stay (HOSPITAL_COMMUNITY)
Admission: EM | Admit: 2019-03-25 | Discharge: 2019-03-30 | DRG: 177 | Disposition: A | Discharge status: Home / Self Care | Payer: Medicare Other | Attending: Family Medicine | Admitting: Family Medicine

## 2019-03-25 ENCOUNTER — Other Ambulatory Visit: Payer: Self-pay

## 2019-03-25 ENCOUNTER — Encounter (HOSPITAL_COMMUNITY): Payer: Self-pay

## 2019-03-25 ENCOUNTER — Emergency Department (HOSPITAL_COMMUNITY): Payer: Medicare Other

## 2019-03-25 DIAGNOSIS — I132 Hypertensive heart and chronic kidney disease with heart failure and with stage 5 chronic kidney disease, or end stage renal disease: Secondary | ICD-10-CM | POA: Diagnosis not present

## 2019-03-25 DIAGNOSIS — E871 Hypo-osmolality and hyponatremia: Secondary | ICD-10-CM | POA: Diagnosis not present

## 2019-03-25 DIAGNOSIS — Z79899 Other long term (current) drug therapy: Secondary | ICD-10-CM

## 2019-03-25 DIAGNOSIS — Z9841 Cataract extraction status, right eye: Secondary | ICD-10-CM

## 2019-03-25 DIAGNOSIS — R0902 Hypoxemia: Secondary | ICD-10-CM | POA: Diagnosis not present

## 2019-03-25 DIAGNOSIS — Z89422 Acquired absence of other left toe(s): Secondary | ICD-10-CM

## 2019-03-25 DIAGNOSIS — K529 Noninfective gastroenteritis and colitis, unspecified: Secondary | ICD-10-CM | POA: Diagnosis not present

## 2019-03-25 DIAGNOSIS — Z86010 Personal history of colonic polyps: Secondary | ICD-10-CM

## 2019-03-25 DIAGNOSIS — Z992 Dependence on renal dialysis: Secondary | ICD-10-CM

## 2019-03-25 DIAGNOSIS — Z794 Long term (current) use of insulin: Secondary | ICD-10-CM

## 2019-03-25 DIAGNOSIS — K811 Chronic cholecystitis: Secondary | ICD-10-CM | POA: Diagnosis present

## 2019-03-25 DIAGNOSIS — K219 Gastro-esophageal reflux disease without esophagitis: Secondary | ICD-10-CM | POA: Diagnosis present

## 2019-03-25 DIAGNOSIS — U071 COVID-19: Secondary | ICD-10-CM | POA: Diagnosis not present

## 2019-03-25 DIAGNOSIS — E8809 Other disorders of plasma-protein metabolism, not elsewhere classified: Secondary | ICD-10-CM | POA: Diagnosis present

## 2019-03-25 DIAGNOSIS — J9601 Acute respiratory failure with hypoxia: Secondary | ICD-10-CM | POA: Diagnosis not present

## 2019-03-25 DIAGNOSIS — I509 Heart failure, unspecified: Secondary | ICD-10-CM | POA: Diagnosis present

## 2019-03-25 DIAGNOSIS — Z961 Presence of intraocular lens: Secondary | ICD-10-CM | POA: Diagnosis present

## 2019-03-25 DIAGNOSIS — N186 End stage renal disease: Secondary | ICD-10-CM | POA: Diagnosis present

## 2019-03-25 DIAGNOSIS — Z6834 Body mass index (BMI) 34.0-34.9, adult: Secondary | ICD-10-CM

## 2019-03-25 DIAGNOSIS — J1282 Pneumonia due to coronavirus disease 2019: Secondary | ICD-10-CM | POA: Diagnosis present

## 2019-03-25 DIAGNOSIS — R197 Diarrhea, unspecified: Secondary | ICD-10-CM | POA: Diagnosis not present

## 2019-03-25 DIAGNOSIS — IMO0002 Reserved for concepts with insufficient information to code with codable children: Secondary | ICD-10-CM

## 2019-03-25 DIAGNOSIS — R1110 Vomiting, unspecified: Secondary | ICD-10-CM | POA: Diagnosis not present

## 2019-03-25 DIAGNOSIS — E669 Obesity, unspecified: Secondary | ICD-10-CM | POA: Diagnosis present

## 2019-03-25 DIAGNOSIS — E1122 Type 2 diabetes mellitus with diabetic chronic kidney disease: Secondary | ICD-10-CM | POA: Diagnosis present

## 2019-03-25 DIAGNOSIS — E11649 Type 2 diabetes mellitus with hypoglycemia without coma: Secondary | ICD-10-CM | POA: Diagnosis present

## 2019-03-25 DIAGNOSIS — D631 Anemia in chronic kidney disease: Secondary | ICD-10-CM | POA: Diagnosis present

## 2019-03-25 DIAGNOSIS — E876 Hypokalemia: Secondary | ICD-10-CM | POA: Diagnosis present

## 2019-03-25 DIAGNOSIS — I10 Essential (primary) hypertension: Secondary | ICD-10-CM | POA: Diagnosis present

## 2019-03-25 DIAGNOSIS — Z96642 Presence of left artificial hip joint: Secondary | ICD-10-CM | POA: Diagnosis present

## 2019-03-25 DIAGNOSIS — Z8249 Family history of ischemic heart disease and other diseases of the circulatory system: Secondary | ICD-10-CM

## 2019-03-25 LAB — COMPREHENSIVE METABOLIC PANEL
ALT: 6 U/L (ref 0–44)
AST: 17 U/L (ref 15–41)
Albumin: 1.8 g/dL — ABNORMAL LOW (ref 3.5–5.0)
Alkaline Phosphatase: 102 U/L (ref 38–126)
Anion gap: 15 (ref 5–15)
BUN: 44 mg/dL — ABNORMAL HIGH (ref 6–20)
CO2: 26 mmol/L (ref 22–32)
Calcium: 7.5 mg/dL — ABNORMAL LOW (ref 8.9–10.3)
Chloride: 86 mmol/L — ABNORMAL LOW (ref 98–111)
Creatinine, Ser: 9.53 mg/dL — ABNORMAL HIGH (ref 0.44–1.00)
GFR calc Af Amer: 5 mL/min — ABNORMAL LOW (ref >60)
GFR calc non Af Amer: 4 mL/min — ABNORMAL LOW (ref >60)
Glucose, Bld: 142 mg/dL — ABNORMAL HIGH (ref 70–99)
Potassium: 3 mmol/L — ABNORMAL LOW (ref 3.5–5.1)
Sodium: 127 mmol/L — ABNORMAL LOW (ref 135–145)
Total Bilirubin: 0.7 mg/dL (ref 0.3–1.2)
Total Protein: 7.1 g/dL (ref 6.5–8.1)

## 2019-03-25 LAB — CBC WITH DIFFERENTIAL/PLATELET
Abs Immature Granulocytes: 0.07 10*3/uL (ref 0.00–0.07)
Basophils Absolute: 0 10*3/uL (ref 0.0–0.1)
Basophils Relative: 0 %
Eosinophils Absolute: 0 10*3/uL (ref 0.0–0.5)
Eosinophils Relative: 0 %
HCT: 36.2 % (ref 36.0–46.0)
Hemoglobin: 11.3 g/dL — ABNORMAL LOW (ref 12.0–15.0)
Immature Granulocytes: 1 %
Lymphocytes Relative: 12 %
Lymphs Abs: 0.9 10*3/uL (ref 0.7–4.0)
MCH: 22.2 pg — ABNORMAL LOW (ref 26.0–34.0)
MCHC: 31.2 g/dL (ref 30.0–36.0)
MCV: 71.3 fL — ABNORMAL LOW (ref 80.0–100.0)
Monocytes Absolute: 0.4 10*3/uL (ref 0.1–1.0)
Monocytes Relative: 6 %
Neutro Abs: 6.1 10*3/uL (ref 1.7–7.7)
Neutrophils Relative %: 81 %
Platelets: 229 10*3/uL (ref 150–400)
RBC: 5.08 MIL/uL (ref 3.87–5.11)
RDW: 20.3 % — ABNORMAL HIGH (ref 11.5–15.5)
WBC: 7.5 10*3/uL (ref 4.0–10.5)
nRBC: 0 % (ref 0.0–0.2)

## 2019-03-25 LAB — POC SARS CORONAVIRUS 2 AG -  ED: SARS Coronavirus 2 Ag: NEGATIVE

## 2019-03-25 IMAGING — DX DG ABDOMEN ACUTE W/ 1V CHEST
4 series · 4 of 4 positions shown · non-contrast
Comparison: [DATE]

CLINICAL DATA: Vomiting and diarrhea

EXAM:
DG ABDOMEN ACUTE W/ 1V CHEST

[chest ap grid]
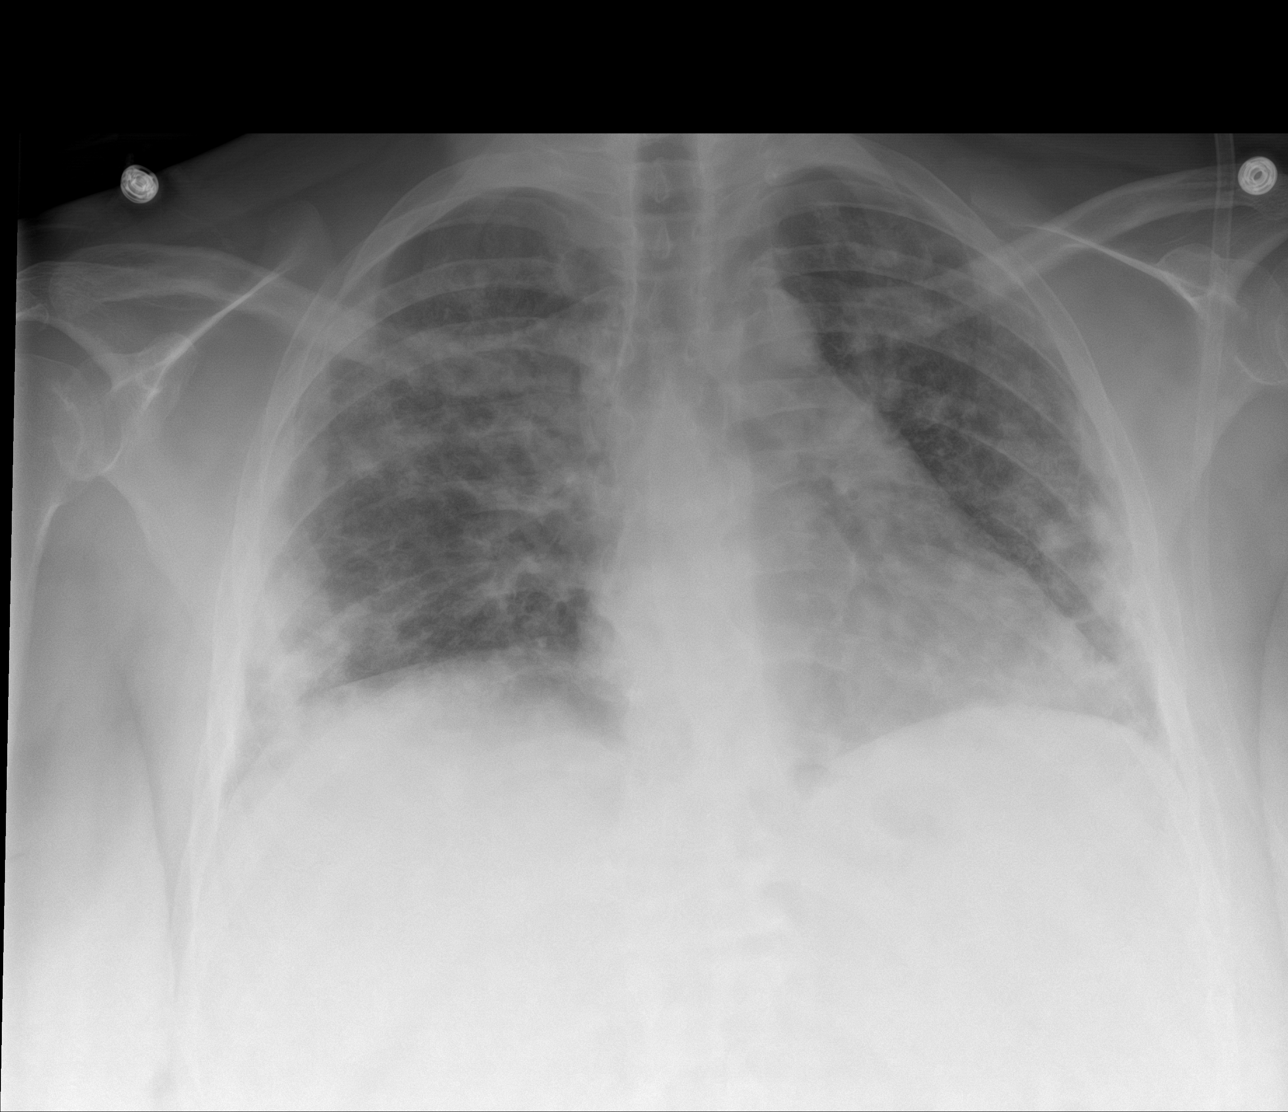

[abdomen erect grid]
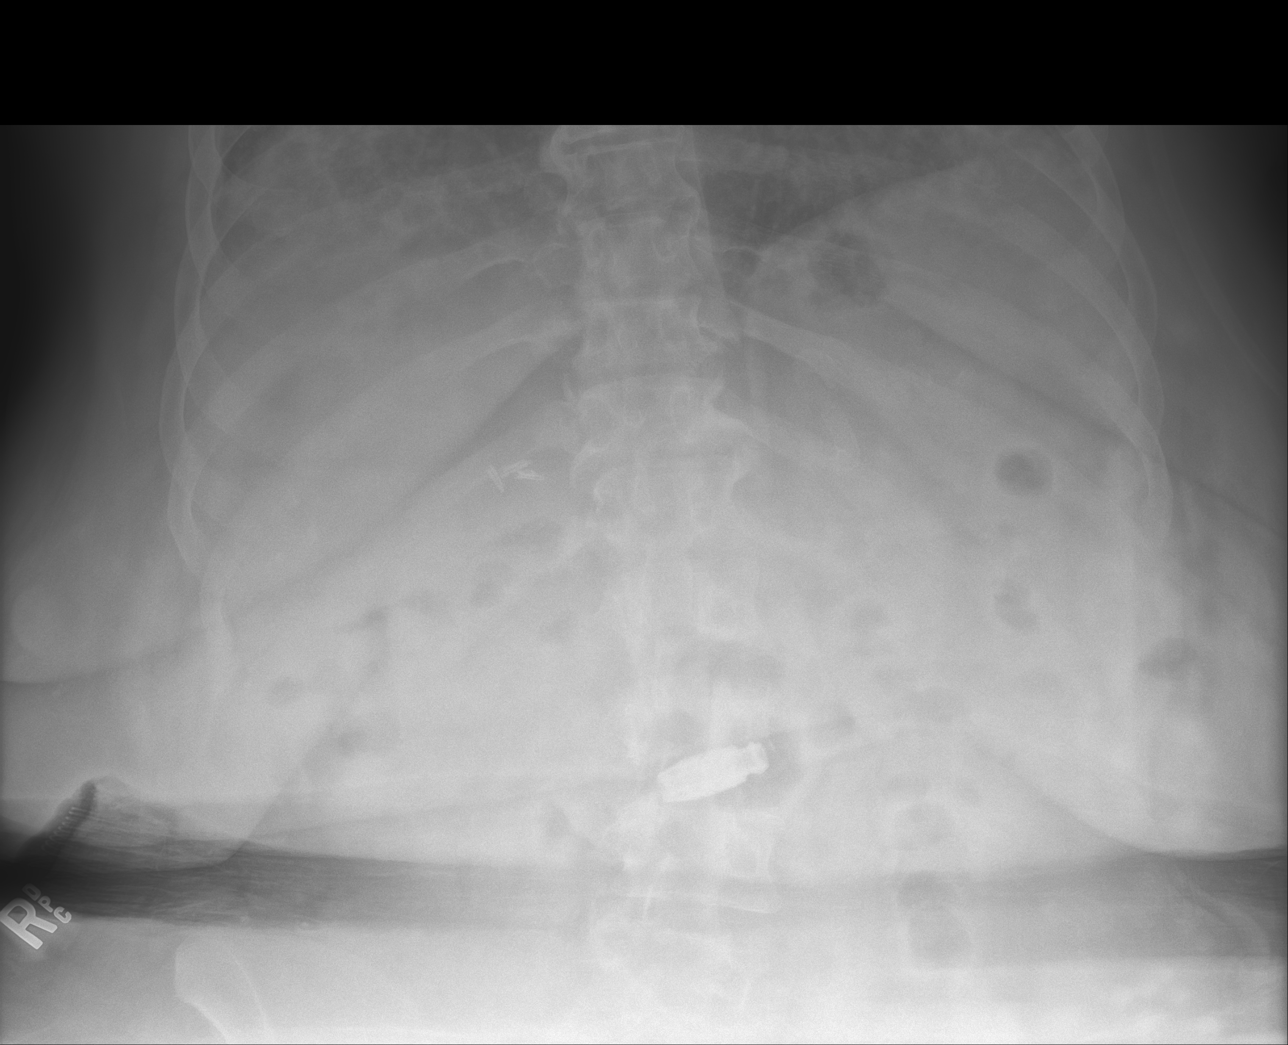

[abdomen supine grid (1 of 2)]
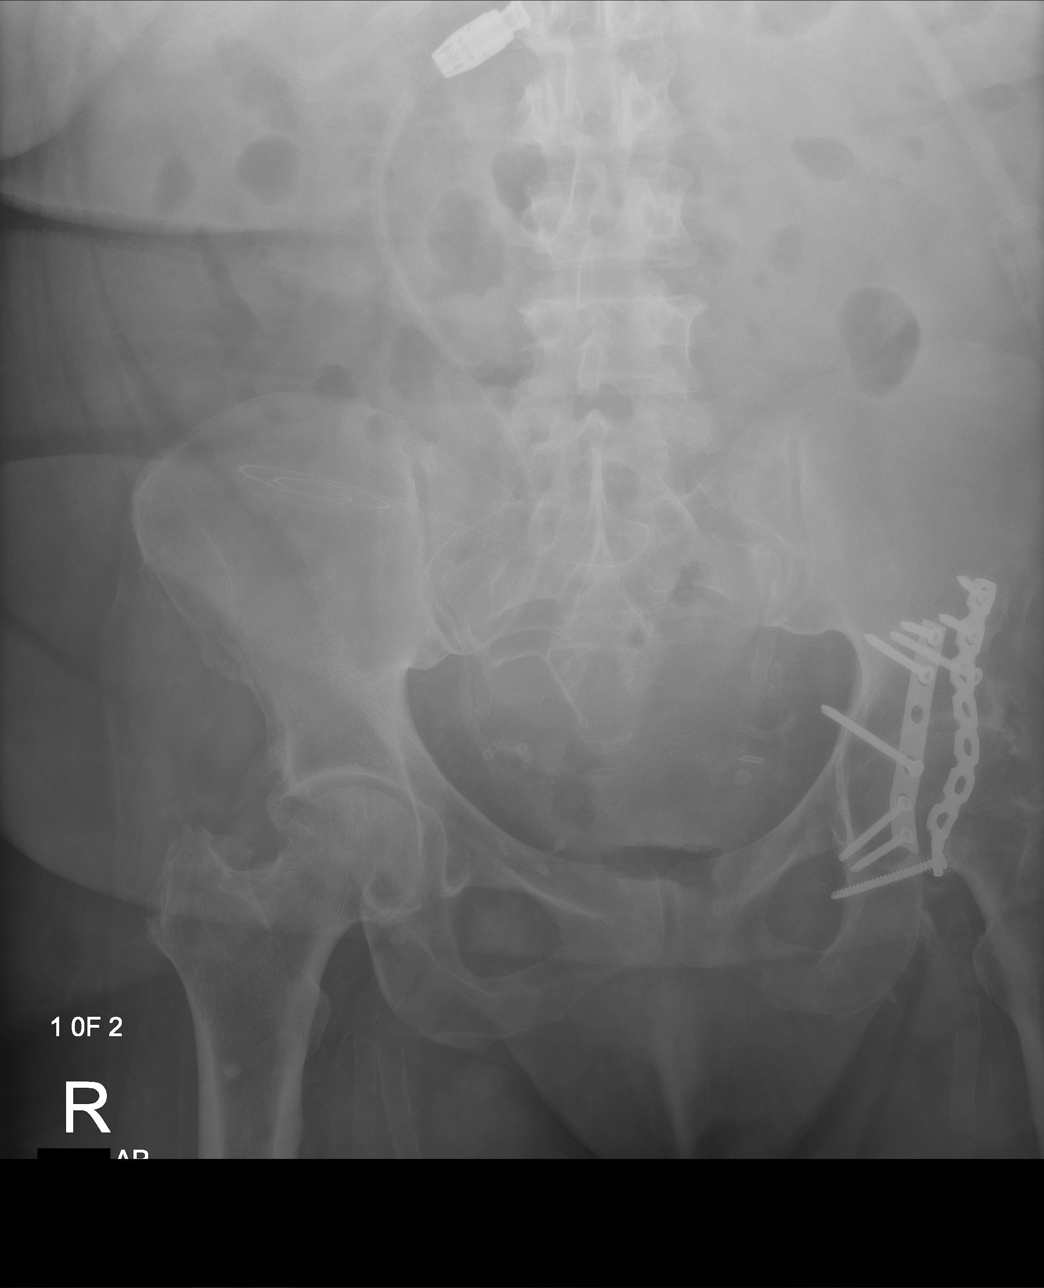

[abdomen supine grid (2 of 2)]
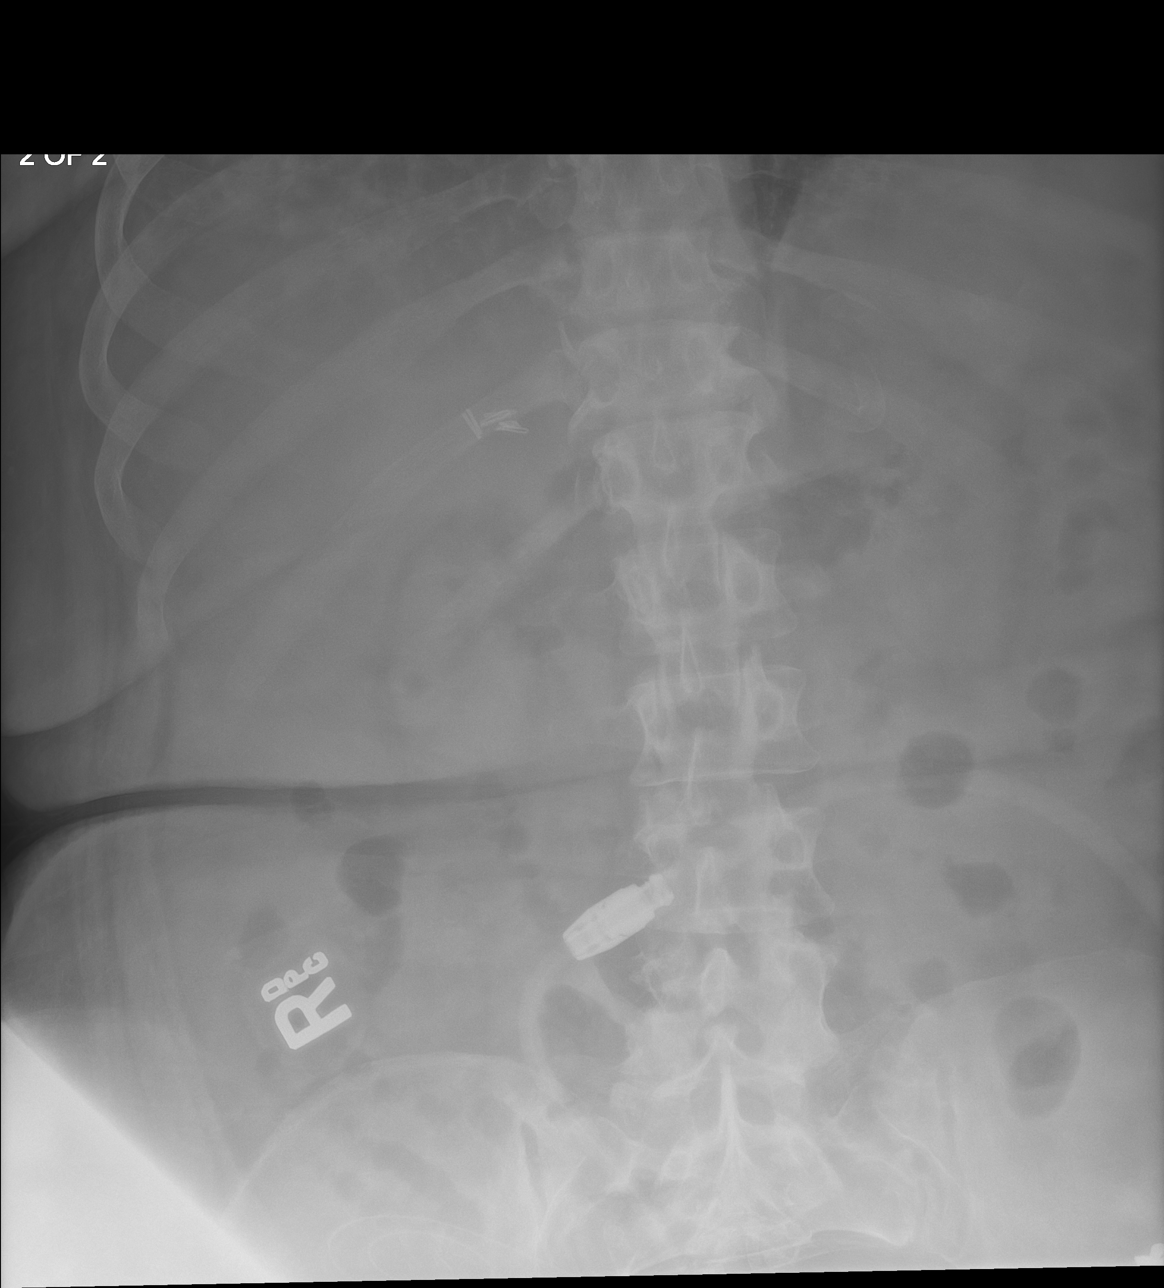

[4 of 4 positions shown; findings below may reference images not displayed]

FINDINGS: Extensive diffuse bilateral airspace disease, with progression from
the prior study. Upper and lower lobe airspace disease bilaterally.
No effusion. Heart size upper normal.

Normal bowel gas pattern.  No obstruction or ileus.  No free air.

Surgical clips in the gallbladder fossa. Fracture fixation of left
acetabular fracture with metal plates and screws.
IMPRESSION: Diffuse bilateral airspace disease, with progression. There appears
to be an element of chronic lung disease with progression. Possible
superimposed pneumonia.

Normal bowel gas pattern.

## 2019-03-25 MED ORDER — ENOXAPARIN SODIUM 30 MG/0.3ML ~~LOC~~ SOLN: 30.0000 mg | SUBCUTANEOUS | Status: DC

## 2019-03-25 MED ORDER — PROCHLORPERAZINE EDISYLATE 10 MG/2ML IJ SOLN: 5.0000 mg | INTRAMUSCULAR | Status: DC | PRN

## 2019-03-25 MED ORDER — ACETAMINOPHEN 325 MG PO TABS
650.0000 mg | ORAL_TABLET | Freq: Four times a day (QID) | ORAL | Status: DC | PRN
  Administered 2019-03-29: 22:00:00 650 mg via ORAL
  Filled 2019-03-25: qty 2

## 2019-03-25 MED ORDER — SODIUM CHLORIDE 0.9 % IV SOLN
1.0000 g | Freq: Once | INTRAVENOUS | Status: AC
  Administered 2019-03-25: 1 g via INTRAVENOUS
  Filled 2019-03-25: qty 10

## 2019-03-25 MED ORDER — FAMOTIDINE IN NACL 20-0.9 MG/50ML-% IV SOLN
20.0000 mg | Freq: Once | INTRAVENOUS | Status: AC
  Administered 2019-03-25: 20 mg via INTRAVENOUS
  Filled 2019-03-25: qty 50

## 2019-03-25 MED ORDER — ACETAMINOPHEN 650 MG RE SUPP: 650.0000 mg | Freq: Four times a day (QID) | RECTAL | Status: DC | PRN

## 2019-03-25 MED ORDER — POTASSIUM CHLORIDE 10 MEQ/100ML IV SOLN
10.0000 meq | Freq: Once | INTRAVENOUS | Status: AC
  Administered 2019-03-26: 10 meq via INTRAVENOUS
  Filled 2019-03-25: qty 100

## 2019-03-25 MED ORDER — SODIUM CHLORIDE 0.9 % IV BOLUS
500.0000 mL | Freq: Once | INTRAVENOUS | Status: AC
  Administered 2019-03-25: 500 mL via INTRAVENOUS

## 2019-03-25 MED ORDER — SODIUM CHLORIDE 0.9 % IV SOLN
500.0000 mg | Freq: Once | INTRAVENOUS | Status: AC
  Administered 2019-03-25: 500 mg via INTRAVENOUS
  Filled 2019-03-25: qty 500

## 2019-03-25 NOTE — ED Triage Notes (Addendum)
Pt has had N/V/D for almost a month. Mostly diarrhea Vomiting maybe twice daily Pt went to urgent care and was brought here by EMS. Pt feels tired and does peritoneal dialysis at home. Seen dr Lyndel Safe prior to [facility] but did not fill scripts

## 2019-03-25 NOTE — ED Notes (Signed)
Pt ambulated to [facility] with little assistance while wearing pulse ox, saturation levels began at 96%, dropped to 93% while standing, then rose back to 97% while sitting. RN aware. NAD.

## 2019-03-25 NOTE — ED Provider Notes (Addendum)
[facility] EMERGENCY DEPARTMENT Provider Note   CSN: [id number] Arrival date & time: 03/25/19  1702     History Chief Complaint  Patient presents with  . Emesis    Janet Mitchell is a 53 y.o. female with a history significant for end-stage renal disease on peritoneal dialysis, CHF, hypertension, type 2 diabetes and chronic diarrhea since having a cholecystectomy 1 year ago presenting with worsened diarrhea in addition to nausea and vomiting.  She reports having a urgent bowel movement after most meals for the past year since having her gallbladder removed, over the past several weeks she has had increasing and persistent diarrhea with incontinence of stools in addition to nausea and multiple episodes of vomiting over the past 2 days.  She states she has had a dry cough, and has had episodes of posttussive emesis as well.  She has had no fevers or chills but endorses extreme weakness stating she has been unable to walk today and has had severe lightheadedness with attempts at ambulation over the past several days.  She has discussed her chronic diarrhea with her GI specialist, was prescribed Levsin a week ago but has not started this medication.  She has taken a whole bottle of Imodium this week without improvement in her diarrhea.  Denies recent antibiotic use.  Denies abdominal pain or distention.  She denies chest pain, but drank water this morning and had severe transient acid reflux after this ingestion.  Of note her initial oxygen saturation was 88% on room air.  She denies feeling short of breath, but also reports has been very sedentary secondary to her complaints of above. She describes feeling very lightheaded with attempts at standing and walking.   The history is provided by the patient.       Past Medical History:  Diagnosis Date  . Anemia   . CHF (congestive heart failure) ([place])   . Chronic cholecystitis with calculus   . ESRD (end stage renal disease) on dialysis [facility])     "peritoneal dialysis q hs" (03/09/2018)  . Headache    "a few/wk" (03/09/2018)  . History of blood transfusion 10/2017   "low blood count" (03/09/2018)  . Hypertension   . Spinal headache   . Type II diabetes mellitus [facility])     Patient Active Problem List   Diagnosis Date Noted  . Acute gastroenteritis 03/25/2019  . Type II diabetes mellitus ([place])   . Hypokalemia   . Hyponatremia   . Anemia in ESRD (end-stage renal disease) ([place])   . Essential hypertension, benign 02/14/2019  . Class 2 severe obesity due to excess calories with serious comorbidity and body mass index (BMI) of 37.0 to 37.9 in adult [facility]) 02/14/2019  . SBP (spontaneous bacterial peritonitis) ([place]) 12/18/2018  . Abdominal pain 11/28/2018  . Rectal burning 08/27/2018  . Rectal bleeding 08/27/2018  . Diarrhea 05/22/2018  . Fluid overload 03/25/2018  . Microcytic anemia 03/25/2018  . Chronic cholecystitis with calculus 03/09/2018  . Type 2 diabetes mellitus with ESRD (end-stage renal disease) ([place]) 03/09/2018  . Pre-transplant evaluation for ESRD (end stage renal disease) 12/19/2017  . Symptomatic anemia 11/06/2017  . Hypertension 11/06/2017  . ESRD on peritoneal dialysis ([place]) 11/06/2017  . Diabetic macular edema ([place]) 04/10/2015  . Edema of lower extremity 04/10/2015  . Uncontrolled type 2 diabetes mellitus ([place]) 04/10/2015    Past Surgical History:  Procedure Laterality Date  . AMPUTATION TOE Left 2013   Great toe  . BIOPSY  10/24/2018  Procedure: BIOPSY;  Surgeon: Daneil Dolin, MD;  Location[facility] ENDO [facility];  Service: Endoscopy;;  right and left colon  . CATARACT EXTRACTION W/ INTRAOCULAR LENS IMPLANT Right   . CESAREAN SECTION  1994; 1998  . CHOLECYSTECTOMY N/A 03/09/2018   Procedure: LAPAROSCOPIC CHOLECYSTECTOMY WITH INTRAOPERATIVE CHOLANGIOGRAM ERAS PATHWAY;  Surgeon: Donnie Mesa, MD;  Location: [place];  Service: General;  Laterality: N/A;  . COLONOSCOPY N/A 10/24/2018   Procedure: COLONOSCOPY;   Surgeon: Daneil Dolin, MD;  Location: [facility] ENDO SUITE;  Service: Endoscopy;  Laterality: N/A;  1:00pm  . EYE SURGERY  05/15/2018   Removal of blood in the globe (due to DM)  . FLEXIBLE SIGMOIDOSCOPY N/A 11/24/2017   Procedure: FLEXIBLE SIGMOIDOSCOPY;  Surgeon: Daneil Dolin, MD;  Location[facility] END[facility];  Service: Endoscopy;  Laterality: N/A;  . LAPAROSCOPIC CHOLECYSTECTOMY  03/09/2018  . PERITONEAL CATHETER INSERTION  2017  . POLYPECTOMY  10/24/2018   Procedure: POLYPECTOMY;  Surgeon: Daneil Dolin, MD;  Location: [facility];  Service: Endoscopy;;  . TOTAL HIP ARTHROPLASTY Left 1997     OB History    Gravida  2   Para  2   Term      Preterm      AB      Living  2     SAB      TAB      Ectopic      Multiple      Live Births              Family History  Problem Relation Age of Onset  . Heart disease Mother   . Thrombocytopenia Mother        TTP  . Heart failure Father   . Kidney disease Paternal Grandfather   . Colon cancer Neg Hx     Social History   Tobacco Use  . Smoking status: Passive Smoke Exposure - Never Smoker  . Smokeless tobacco: Never Used  Substance Use Topics  . Alcohol use: Never  . Drug use: Never    Home Medications Prior to Admission medications   Medication Sig Start Date End Date Taking? Authorizing Provider  ALPRAZolam Duanne Moron) 0.5 MG tablet Take 0.5 mg by mouth at bedtime as needed for anxiety.    [provider]  amLODipine (NORVASC) 10 MG tablet Take 1 tablet (10 mg total) by mouth daily. 11/09/17   Roxan Hockey, MD  Blood Glucose Monitoring Suppl (ACCU-CHEK GUIDE) w/Device KIT 1 Piece by Does not apply route as directed. 02/28/19   Cassandria Anger, MD  calcitRIOL (ROCALTROL) 0.5 MCG capsule Take 0.5-1 mcg by mouth See admin instructions. 0.47mg on Mondays through Fridays and take 179m on Saturdays and Sundays    [provider]  Crisaborole (EUCRISA) 2 % OINT Apply 1 application topically daily  as needed (for rash/irritation).    [provider]  doxazosin (CARDURA) 8 MG tablet Take 8 mg by mouth at bedtime.    [provider]  glucose blood (ACCU-CHEK GUIDE) test strip Use as instructed 02/28/19   NiCassandria AngerMD  Hyoscyamine Sulfate SL (LEVSIN/SL) 0.125 MG SUBL Place 0.125 mg under the tongue 3 (three) times daily as needed (diarrhea). 03/19/19   GiCarlis StableNP  Insulin Glargine, 2 Unit Dial, 300 UNIT/ML SOPN Inject 20 Units into the skin at bedtime. 02/28/19   NiCassandria AngerMD  labetalol (NORMODYNE) 200 MG tablet Take 200 mg by mouth 2 (two) times daily.  [provider]  losartan (COZAAR) 100 MG tablet Take 100 mg by mouth daily.    [provider]    Allergies    Patient has no known allergies.  Review of Systems   Review of Systems  Constitutional: Positive for appetite change and fatigue. Negative for chills and fever.  HENT: Negative for congestion and sore throat.   Eyes: Negative.   Respiratory: Negative for chest tightness and shortness of breath.   Cardiovascular: Negative for chest pain.  Gastrointestinal: Positive for diarrhea, nausea and vomiting. Negative for abdominal pain.  Genitourinary: Negative.   Musculoskeletal: Negative for arthralgias, joint swelling and neck pain.  Skin: Negative.  Negative for rash and wound.  Neurological: Positive for weakness and light-headedness. Negative for dizziness, numbness and headaches.  Psychiatric/Behavioral: Negative.     Physical Exam Updated Vital Signs BP 118/71 (BP Location: Right Arm)   Pulse 77   Temp 98 F (36.7 C) (Oral)   Resp 16   Ht _0  (1.727 m)   Wt 113.3 kg   SpO2 (!) 88%   BMI 37.98 kg/m   Physical Exam Vitals and nursing note reviewed.  Constitutional:      Appearance: She is well-developed.  HENT:     Head: Normocephalic and atraumatic.  Eyes:     Conjunctiva/sclera: Conjunctivae normal.  Cardiovascular:     Rate and Rhythm:  Normal rate and regular rhythm.     Heart sounds: Normal heart sounds.  Pulmonary:     Effort: Pulmonary effort is normal. No respiratory distress.     Breath sounds: No wheezing.     Comments: Reduced breath sounds, no rhonchi, wheeze or rales. Abdominal:     General: Bowel sounds are normal.     Palpations: Abdomen is soft.     Tenderness: There is no abdominal tenderness. There is no guarding or rebound.  Musculoskeletal:        General: Normal range of motion.     Cervical back: Normal range of motion.     Right lower leg: No edema.     Left lower leg: No edema.  Skin:    General: Skin is warm and dry.  Neurological:     Mental Status: She is alert.     ED Results / Procedures / Treatments   Labs (all labs ordered are listed, but only abnormal results are displayed) Labs Reviewed  RESPIRATORY PANEL BY RT PCR (FLU A&B, COVID) - Abnormal; Notable for the following components:      Result Value   SARS Coronavirus 2 by RT PCR POSITIVE (*)    All other components within normal limits  CBC WITH DIFFERENTIAL/PLATELET - Abnormal; Notable for the following components:   Hemoglobin 11.3 (*)    MCV 71.3 (*)    MCH 22.2 (*)    RDW 20.3 (*)    All other components within normal limits  COMPREHENSIVE METABOLIC PANEL - Abnormal; Notable for the following components:   Sodium 127 (*)    Potassium 3.0 (*)    Chloride 86 (*)    Glucose, Bld 142 (*)    BUN 44 (*)    Creatinine, Ser 9.53 (*)    Calcium 7.5 (*)    Albumin 1.8 (*)    GFR calc non Af Amer 4 (*)    GFR calc Af Amer 5 (*)    All other components within normal limits  POC SARS CORONAVIRUS 2 AG -  ED    EKG None  Radiology  Acute Abd Series  Result Date: 03/25/2019 CLINICAL DATA:  Vomiting and diarrhea EXAM: DG ABDOMEN ACUTE W/ 1V CHEST COMPARISON:  03/24/2018 FINDINGS: Extensive diffuse bilateral airspace disease, with progression from the prior study. Upper and lower lobe airspace disease bilaterally. No effusion.  Heart size upper normal. Normal bowel gas pattern.  No obstruction or ileus.  No free air. Surgical clips in the gallbladder fossa. Fracture fixation of left acetabular fracture with metal plates and screws. IMPRESSION: Diffuse bilateral airspace disease, with progression. There appears to be an element of chronic lung disease with progression. Possible superimposed pneumonia. Normal bowel gas pattern. Electronically Signed   By: Franchot Gallo M.D.   On: 03/25/2019 21:12    Procedures Procedures (including critical care time)  Medications Ordered in ED Medications  azithromycin (ZITHROMAX) 500 mg in sodium chloride 0.9 % 250 mL IVPB (500 mg Intravenous New Bag/Given 03/25/19 2353)  potassium chloride 10 mEq in 100 mL IVPB (has no administration in time range)  acetaminophen (TYLENOL) tablet 650 mg (has no administration in time range)    Or  acetaminophen (TYLENOL) suppository 650 mg (has no administration in time range)  prochlorperazine (COMPAZINE) injection 5 mg (has no administration in time range)  sodium chloride 0.9 % bolus 500 mL (0 mLs Intravenous Stopped 03/25/19 2158)  famotidine (PEPCID) IVPB 20 mg premix (0 mg Intravenous Stopped 03/25/19 2030)  cefTRIAXone (ROCEPHIN) 1 g in sodium chloride 0.9 % 100 mL IVPB (0 g Intravenous Stopped 03/25/19 2333)    ED Course  I have reviewed the triage vital signs and the nursing notes.  Pertinent labs & imaging results that were available during my care of the patient were reviewed by me and considered in my medical decision making (see chart for details).    MDM Rules/Calculators/A&P                      Pt with weakness and fatigue, uncontrolled diarrhea with nausea and anorexia, with pneumonia and hypoxia on presentation.  Rapid covid negative, pending confirmatory PCR.  She also has hyponatremia and hypokalemia, suspect dehydration given hx.  She does report having weight loss, states her normal weight 114 kg,  Last check this am 103  kg.   Pt presents primarily with gastroenteritis with suspected dehydration, but complicated by peritoneal dialysis pt.  Hypoxia with cxr suggesting pneumonia.  She was started on IV abx.  Discussed with Dr. Olevia Bowens for admission.  Addendum:  Confirmatory covid PCR positive.  Hospitalist notified.  Juliany Daughety Mitchell was evaluated in Emergency Department on 03/26/2019 for the symptoms described in the history of present illness. She was evaluated in the context of the global COVID-19 pandemic, which necessitated consideration that the patient might be at risk for infection with the SARS-CoV-2 virus that causes COVID-19. Institutional protocols and algorithms that pertain to the evaluation of patients at risk for COVID-19 are in a state of rapid change based on information released by regulatory bodies including the CDC and federal and state organizations. These policies and algorithms were followed during the patient's care in the ED.  Final Clinical Impression(s) / ED Diagnoses Final diagnoses:  Gastroenteritis  Hyponatremia  Hypokalemia  Hypoxia  COVID-19    Rx / DC Orders ED Discharge Orders    None       Landis Martins 03/26/19 Anna Genre, PA-C 03/26/19 0044    Noemi Chapel, MD 03/26/19 [phone]

## 2019-03-25 NOTE — ED Notes (Signed)
RN placed pt on 2L of oxygen. Saturation 88% room air on 2L 93%.

## 2019-03-25 NOTE — H&P (Signed)
History and Physical    Janet Stankowski Pinnix LPN:[id number] DOB: 02-25-1966 DOA: 03/25/2019  PCP: Lucianne Lei, MD   Patient coming from: Home.  I have personally briefly reviewed patient's old medical records in [place]  Chief Complaint: Nausea, vomiting and diarrhea.  HPI: Janet Mitchell is a 53 y.o. female with medical history significant of anemia due to ESRD, history of blood transfusion, CHF, chronic cholecystitis with calculus, ESRD on PD, history of headaches, history of hypertension, spinal headaches, type 2 diabetes mellitus who is coming to the emergency department due to nausea, vomiting and diarrhea for about a month.  However, the patient states that since very early in the year she has been having occasional nausea, vomiting and loose stools.  She has had more loose stools post cholecystectomy.  She also has been coughing and this sometimes triggers emesis.  However, over the past 3 to 4 weeks this pattern has progressively worsened and she now is having a lot more symptomatology.  She went to see her gastroenterologist who prescribed Levsin, but has not been able to start the medication.  She has been using Imodium without significant results.  She denies abdominal pain, melena or hematochezia.   She denies fever, chills, but feels very fatigued.She rarely urinates.  No dysuria, frequency hematuria.  No chest pain, palpitations, diaphoresis, but gets occasionally lightheaded.  ED Course: Initial vital signs temperature 98 F, pulse 77, respirations 16, blood pressure 118/71 mmHg and O2 sat 88% on room air.  The patient received a 500 mL bolus, KCl 10 mEq IVPB x1, famotidine IV, ceftriaxone and Zithromax in the emergency department.  WBC 7.5 with 81% neutral, hemoglobin 11.3 g/dL, platelets 229.  SARS antigen was negative but PCR was positive.  Sodium 127, potassium 3.0, chloride 86 and CO2 26 mmol/L.  Sodium 142, BUN 44 and creatinine 9.53 mg/dL.  LFTs show hypoalbuminemia  1.9 g/dL, but all other values are within expected range.  Ferritin was 1474, CRP 27.9, D-dimer 2.97, LDH 248, fibrinogen more than 800 and procalcitonin 1.70 ng/mL.  Her chest radiograph shows diffuse bilateral airspace disease.  Please see image and radiology report for further detail.  Review of Systems: As per HPI otherwise 10 point review of systems negative  Past Medical History:  Diagnosis Date  . Anemia   . CHF (congestive heart failure) ([place])   . Chronic cholecystitis with calculus   . ESRD (end stage renal disease) on dialysis [facility])    "peritoneal dialysis q hs" (03/09/2018)  . Headache    "a few/wk" (03/09/2018)  . History of blood transfusion 10/2017   "low blood count" (03/09/2018)  . Hypertension   . Spinal headache   . Type II diabetes mellitus ([place])     Past Surgical History:  Procedure Laterality Date  . AMPUTATION TOE Left 2013   Great toe  . BIOPSY  10/24/2018   Procedure: BIOPSY;  Surgeon: Daneil Dolin, MD;  [facility];  Service: Endoscopy;;  right and left colon  . CATARACT EXTRACTION W/ INTRAOCULAR LENS IMPLANT Right   . CESAREAN SECTION  1994; 1998  . CHOLECYSTECTOMY N/A 03/09/2018   Procedure: LAPAROSCOPIC CHOLECYSTECTOMY WITH INTRAOPERATIVE CHOLANGIOGRAM ERAS PATHWAY;  Surgeon: Donnie Mesa, MD;  Location: [place];  Service: General;  Laterality: N/A;  . COLONOSCOPY N/A 10/24/2018   Procedure: COLONOSCOPY;  Surgeon: Daneil Dolin, MD;  [facility];  Service: Endoscopy;  Laterality: N/A;  1:00pm  . EYE SURGERY  05/15/2018  Removal of blood in the globe (due to DM)  . FLEXIBLE SIGMOIDOSCOPY N/A 11/24/2017   Procedure: FLEXIBLE SIGMOIDOSCOPY;  Surgeon: Daneil Dolin, MD;  [facility];  Service: Endoscopy;  Laterality: N/A;  . LAPAROSCOPIC CHOLECYSTECTOMY  03/09/2018  . PERITONEAL CATHETER INSERTION  2017  . POLYPECTOMY  10/24/2018   Procedure: POLYPECTOMY;  Surgeon: Daneil Dolin, MD;  [facility];   Service: Endoscopy;;  . TOTAL HIP ARTHROPLASTY Left 1997     reports that she is a non-smoker but has been exposed to tobacco smoke. She has never used smokeless tobacco. She reports that she does not drink alcohol or use drugs.  No Known Allergies  Family History  Problem Relation Age of Onset  . Heart disease Mother   . Thrombocytopenia Mother        TTP  . Heart failure Father   . Kidney disease Paternal Grandfather   . Colon cancer Neg Hx    Prior to Admission medications   Medication Sig Start Date End Date Taking? Authorizing Provider  ALPRAZolam Duanne Moron) 0.5 MG tablet Take 0.5 mg by mouth at bedtime as needed for anxiety.    [provider]  amLODipine (NORVASC) 10 MG tablet Take 1 tablet (10 mg total) by mouth daily. 11/09/17   Roxan Hockey, MD  Blood Glucose Monitoring Suppl (ACCU-CHEK GUIDE) w/Device KIT 1 Piece by Does not apply route as directed. 02/28/19   Cassandria Anger, MD  calcitRIOL (ROCALTROL) 0.5 MCG capsule Take 0.5-1 mcg by mouth See admin instructions. 0.68mg on Mondays through Fridays and take 15m on Saturdays and Sundays    [provider]  Crisaborole (EUCRISA) 2 % OINT Apply 1 application topically daily as needed (for rash/irritation).    [provider]  doxazosin (CARDURA) 8 MG tablet Take 8 mg by mouth at bedtime.    [provider]  glucose blood (ACCU-CHEK GUIDE) test strip Use as instructed 02/28/19   NiCassandria AngerMD  Hyoscyamine Sulfate SL (LEVSIN/SL) 0.125 MG SUBL Place 0.125 mg under the tongue 3 (three) times daily as needed (diarrhea). 03/19/19   GiCarlis StableNP  Insulin Glargine, 2 Unit Dial, 300 UNIT/ML SOPN Inject 20 Units into the skin at bedtime. 02/28/19   NiCassandria AngerMD  labetalol (NORMODYNE) 200 MG tablet Take 200 mg by mouth 2 (two) times daily.    [provider]  losartan (COZAAR) 100 MG tablet Take 100 mg by mouth daily.    [provider]    Physical  Exam: Vitals:   03/25/19 1722 03/25/19 1726  BP: 118/71   Pulse: 77   Resp: 16   Temp: 98 F (36.7 C)   TempSrc: Oral   SpO2: (!) 88%   Weight: 113.4 kg 113.3 kg  Height: '5\' 8"'  (1.727 m)     Constitutional: NAD, calm, comfortable Eyes: PERRL, lids and conjunctivae normal ENMT: Mucous membranes are moist. Posterior pharynx clear of any exudate or lesions. Neck: normal, supple, no masses, no thyromegaly Respiratory: clear to auscultation bilaterally, no wheezing, no crackles. Normal respiratory effort. No accessory muscle use.  Cardiovascular: Regular rate and rhythm, no murmurs / rubs / gallops. No extremity edema. 2+ pedal pulses. No carotid bruits.  Abdomen: no tenderness, no masses palpated. No hepatosplenomegaly. Bowel sounds positive.  Musculoskeletal: no clubbing / cyanosis. Good ROM, no contractures. Normal muscle tone.  Skin: no rashes, lesions, ulcers on limited dermatological examination. Neurologic: CN 2-12 grossly intact. Sensation intact, DTR normal.  Strength 5/5 in all 4.  Psychiatric: Normal judgment and insight. Alert and oriented x 3. Normal mood.   Labs on Admission: I have personally reviewed following labs and imaging studies  CBC: Recent Labs  Lab 03/25/19 1835  WBC 7.5  NEUTROABS 6.1  HGB 11.3*  HCT 36.2  MCV 71.3*  PLT 604   Basic Metabolic Panel: Recent Labs  Lab 03/25/19 1835  NA 127*  K 3.0*  CL 86*  CO2 26  GLUCOSE 142*  BUN 44*  CREATININE 9.53*  CALCIUM 7.5*   GFR: Estimated Creatinine Clearance: 9.2 mL/min (A) (by C-G formula based on SCr of 9.53 mg/dL (H)). Liver Function Tests: Recent Labs  Lab 03/25/19 1835  AST 17  ALT 6  ALKPHOS 102  BILITOT 0.7  PROT 7.1  ALBUMIN 1.8*   No results for input(s): LIPASE, AMYLASE in the last 168 hours. No results for input(s): AMMONIA in the last 168 hours. Coagulation Profile: No results for input(s): INR, PROTIME in the last 168 hours. Cardiac Enzymes: No results for input(s):  CKTOTAL, CKMB, CKMBINDEX, TROPONINI in the last 168 hours. BNP (last 3 results) No results for input(s): PROBNP in the last 8760 hours. HbA1C: No results for input(s): HGBA1C in the last 72 hours. CBG: No results for input(s): GLUCAP in the last 168 hours. Lipid Profile: No results for input(s): CHOL, HDL, LDLCALC, TRIG, CHOLHDL, LDLDIRECT in the last 72 hours. Thyroid Function Tests: No results for input(s): TSH, T4TOTAL, FREET4, T3FREE, THYROIDAB in the last 72 hours. Anemia Panel: No results for input(s): VITAMINB12, FOLATE, FERRITIN, TIBC, IRON, RETICCTPCT in the last 72 hours. Urine analysis:    Component Value Date/Time   COLORURINE AMBER (A) 07/31/2018 1800   APPEARANCEUR TURBID (A) 07/31/2018 1800   LABSPEC >1.046 (H) 07/31/2018 1800   PHURINE 5.0 07/31/2018 1800   GLUCOSEU 150 (A) 07/31/2018 1800   HGBUR SMALL (A) 07/31/2018 1800   BILIRUBINUR NEGATIVE 07/31/2018 1800   KETONESUR NEGATIVE 07/31/2018 1800   PROTEINUR >=300 (A) 07/31/2018 1800   NITRITE NEGATIVE 07/31/2018 1800   LEUKOCYTESUR MODERATE (A) 07/31/2018 1800    Radiological Exams on Admission: Acute Abd Series  Result Date: 03/25/2019 CLINICAL DATA:  Vomiting and diarrhea EXAM: DG ABDOMEN ACUTE W/ 1V CHEST COMPARISON:  03/24/2018 FINDINGS: Extensive diffuse bilateral airspace disease, with progression from the prior study. Upper and lower lobe airspace disease bilaterally. No effusion. Heart size upper normal. Normal bowel gas pattern.  No obstruction or ileus.  No free air. Surgical clips in the gallbladder fossa. Fracture fixation of left acetabular fracture with metal plates and screws. IMPRESSION: Diffuse bilateral airspace disease, with progression. There appears to be an element of chronic lung disease with progression. Possible superimposed pneumonia. Normal bowel gas pattern. Electronically Signed   By: Franchot Gallo M.D.   On: 03/25/2019 21:12    EKG: Independently reviewed.    Assessment/Plan Principal Problem:   Acute gastroenteritis Observation/telemetry. Gentle or pulse IV hydration only. Defer further IVF management to nephrology. Antiemetics as needed. Analgesics as needed.  Active Problems:   Pneumonia due to COVID-19 virus Continue supplemental oxygen. Bronchodilators as needed. Continue glucocorticoids. Pharmacy to dose remdesivir. Follow-up CBC, CMP and inflammatory markers.    Hypertension Continue antihypertensives once med rec performed. Monitor blood pressure.    ESRD on peritoneal dialysis (HCC)\ Will be transferred to [facility] for PD management. Monitor renal function electrolytes. Continue calcitriol. Consult nephrology.    Type II diabetes mellitus (HCC) Carbohydrate modified diet. Continue Lantus at bedtime. CBG  monitoring with RI SS.    Hypokalemia Gently replaced in ED. Follow-up potassium level.    Hyponatremia Received small bolus in the ED. Follow-up sodium level.    Anemia in ESRD (end-stage renal disease) (HCC) Monitor H&H. Erythropoietin per nephrology.   DVT prophylaxis: Lovenox SQ. Code Status: Full code. Family Communication: Disposition Plan: Observation/tele[facility] at [facility]. Consults called:  Admission status: Inpatient/telemetry.   Reubin Milan MD [facility]  If 7PM-7AM, please contact night-coverage [email or web address]  03/25/2019, 11:38 PM   This document was prepared using Dragon voice recognition software and may contain some unintended transcription errors.

## 2019-03-26 DIAGNOSIS — U071 COVID-19: Secondary | ICD-10-CM

## 2019-03-26 DIAGNOSIS — Z8249 Family history of ischemic heart disease and other diseases of the circulatory system: Secondary | ICD-10-CM | POA: Diagnosis not present

## 2019-03-26 DIAGNOSIS — K529 Noninfective gastroenteritis and colitis, unspecified: Secondary | ICD-10-CM | POA: Diagnosis present

## 2019-03-26 DIAGNOSIS — E1122 Type 2 diabetes mellitus with diabetic chronic kidney disease: Secondary | ICD-10-CM | POA: Diagnosis not present

## 2019-03-26 DIAGNOSIS — N186 End stage renal disease: Secondary | ICD-10-CM | POA: Diagnosis not present

## 2019-03-26 DIAGNOSIS — Z6834 Body mass index (BMI) 34.0-34.9, adult: Secondary | ICD-10-CM | POA: Diagnosis not present

## 2019-03-26 DIAGNOSIS — Z992 Dependence on renal dialysis: Secondary | ICD-10-CM | POA: Diagnosis not present

## 2019-03-26 DIAGNOSIS — E871 Hypo-osmolality and hyponatremia: Secondary | ICD-10-CM | POA: Diagnosis present

## 2019-03-26 DIAGNOSIS — Z794 Long term (current) use of insulin: Secondary | ICD-10-CM | POA: Diagnosis not present

## 2019-03-26 DIAGNOSIS — E11649 Type 2 diabetes mellitus with hypoglycemia without coma: Secondary | ICD-10-CM | POA: Diagnosis present

## 2019-03-26 DIAGNOSIS — E876 Hypokalemia: Secondary | ICD-10-CM

## 2019-03-26 DIAGNOSIS — K811 Chronic cholecystitis: Secondary | ICD-10-CM | POA: Diagnosis present

## 2019-03-26 DIAGNOSIS — J9601 Acute respiratory failure with hypoxia: Secondary | ICD-10-CM | POA: Diagnosis present

## 2019-03-26 DIAGNOSIS — N2581 Secondary hyperparathyroidism of renal origin: Secondary | ICD-10-CM | POA: Diagnosis not present

## 2019-03-26 DIAGNOSIS — J1282 Pneumonia due to coronavirus disease 2019: Secondary | ICD-10-CM | POA: Diagnosis present

## 2019-03-26 DIAGNOSIS — E669 Obesity, unspecified: Secondary | ICD-10-CM | POA: Diagnosis present

## 2019-03-26 DIAGNOSIS — I10 Essential (primary) hypertension: Secondary | ICD-10-CM | POA: Diagnosis not present

## 2019-03-26 DIAGNOSIS — Z96642 Presence of left artificial hip joint: Secondary | ICD-10-CM | POA: Diagnosis present

## 2019-03-26 DIAGNOSIS — I120 Hypertensive chronic kidney disease with stage 5 chronic kidney disease or end stage renal disease: Secondary | ICD-10-CM | POA: Diagnosis not present

## 2019-03-26 DIAGNOSIS — E8809 Other disorders of plasma-protein metabolism, not elsewhere classified: Secondary | ICD-10-CM | POA: Diagnosis present

## 2019-03-26 DIAGNOSIS — D631 Anemia in chronic kidney disease: Secondary | ICD-10-CM | POA: Diagnosis not present

## 2019-03-26 DIAGNOSIS — I132 Hypertensive heart and chronic kidney disease with heart failure and with stage 5 chronic kidney disease, or end stage renal disease: Secondary | ICD-10-CM | POA: Diagnosis present

## 2019-03-26 DIAGNOSIS — K219 Gastro-esophageal reflux disease without esophagitis: Secondary | ICD-10-CM | POA: Diagnosis present

## 2019-03-26 DIAGNOSIS — I509 Heart failure, unspecified: Secondary | ICD-10-CM | POA: Diagnosis present

## 2019-03-26 DIAGNOSIS — Z79899 Other long term (current) drug therapy: Secondary | ICD-10-CM | POA: Diagnosis not present

## 2019-03-26 DIAGNOSIS — Z9841 Cataract extraction status, right eye: Secondary | ICD-10-CM | POA: Diagnosis not present

## 2019-03-26 DIAGNOSIS — Z961 Presence of intraocular lens: Secondary | ICD-10-CM | POA: Diagnosis present

## 2019-03-26 HISTORY — DX: COVID-19: U07.1

## 2019-03-26 LAB — RESPIRATORY PANEL BY RT PCR (FLU A&B, COVID)
Influenza A by PCR: NEGATIVE
Influenza B by PCR: NEGATIVE
SARS Coronavirus 2 by RT PCR: POSITIVE — AB

## 2019-03-26 LAB — PROCALCITONIN: Procalcitonin: 1.7 ng/mL

## 2019-03-26 LAB — HEPATITIS B SURFACE ANTIGEN: Hepatitis B Surface Ag: NONREACTIVE

## 2019-03-26 LAB — FERRITIN: Ferritin: 1474 ng/mL — ABNORMAL HIGH (ref 11–307)

## 2019-03-26 LAB — D-DIMER, QUANTITATIVE: D-Dimer, Quant: 2.97 ug/mL-FEU — ABNORMAL HIGH (ref 0.00–0.50)

## 2019-03-26 LAB — CBG MONITORING, ED
Glucose-Capillary: 96 mg/dL (ref 70–99)
Glucose-Capillary: 147 mg/dL — ABNORMAL HIGH (ref 70–99)
Glucose-Capillary: 179 mg/dL — ABNORMAL HIGH (ref 70–99)
Glucose-Capillary: 195 mg/dL — ABNORMAL HIGH (ref 70–99)
Glucose-Capillary: 222 mg/dL — ABNORMAL HIGH (ref 70–99)
Glucose-Capillary: 120 mg/dL — ABNORMAL HIGH (ref 70–99)

## 2019-03-26 LAB — C-REACTIVE PROTEIN: CRP: 27.9 mg/dL — ABNORMAL HIGH (ref <1.0)

## 2019-03-26 LAB — FIBRINOGEN: Fibrinogen: >800 mg/dL — ABNORMAL HIGH (ref 210–475)

## 2019-03-26 LAB — LACTATE DEHYDROGENASE: LDH: 248 U/L — ABNORMAL HIGH (ref 98–192)

## 2019-03-26 LAB — ABO/RH: ABO/RH(D): A POS

## 2019-03-26 MED ORDER — IPRATROPIUM-ALBUTEROL 20-100 MCG/ACT IN AERS
2.0000 | INHALATION_SPRAY | Freq: Four times a day (QID) | RESPIRATORY_TRACT | Status: DC
  Filled 2019-03-26: qty 4

## 2019-03-26 MED ORDER — CALCITRIOL 0.25 MCG PO CAPS
0.5000 ug | ORAL_CAPSULE | ORAL | Status: DC
  Administered 2019-03-26 – 2019-03-29 (×4): 0.5 ug via ORAL
  Filled 2019-03-26: qty 1
  Filled 2019-03-26 (×2): qty 2
  Filled 2019-03-26: qty 1

## 2019-03-26 MED ORDER — ASCORBIC ACID 500 MG PO TABS
500.0000 mg | ORAL_TABLET | Freq: Every day | ORAL | Status: DC
  Administered 2019-03-26 – 2019-03-30 (×5): 500 mg via ORAL
  Filled 2019-03-26 (×4): qty 1

## 2019-03-26 MED ORDER — IPRATROPIUM-ALBUTEROL 20-100 MCG/ACT IN AERS
2.0000 | INHALATION_SPRAY | Freq: Four times a day (QID) | RESPIRATORY_TRACT | Status: DC
  Administered 2019-03-26 – 2019-03-30 (×14): 2 via RESPIRATORY_TRACT
  Filled 2019-03-26 (×2): qty 4

## 2019-03-26 MED ORDER — METHYLPREDNISOLONE SODIUM SUCC 125 MG IJ SOLR
0.5000 mg/kg | Freq: Two times a day (BID) | INTRAMUSCULAR | Status: DC
  Administered 2019-03-26 – 2019-03-30 (×8): 56.875 mg via INTRAVENOUS
  Filled 2019-03-26 (×8): qty 2

## 2019-03-26 MED ORDER — ALPRAZOLAM 0.5 MG PO TABS
0.5000 mg | ORAL_TABLET | Freq: Every evening | ORAL | Status: DC | PRN
  Administered 2019-03-30: 02:00:00 0.5 mg via ORAL
  Filled 2019-03-26: qty 1

## 2019-03-26 MED ORDER — SODIUM CHLORIDE 0.9 % IV SOLN
100.0000 mg | Freq: Every day | INTRAVENOUS | Status: AC
  Administered 2019-03-27 – 2019-03-30 (×4): 100 mg via INTRAVENOUS
  Filled 2019-03-26 (×5): qty 20

## 2019-03-26 MED ORDER — IPRATROPIUM-ALBUTEROL 20-100 MCG/ACT IN AERS
INHALATION_SPRAY | RESPIRATORY_TRACT | Status: AC
  Filled 2019-03-26: qty 4

## 2019-03-26 MED ORDER — ZINC SULFATE 220 (50 ZN) MG PO CAPS
220.0000 mg | ORAL_CAPSULE | Freq: Every day | ORAL | Status: DC
  Administered 2019-03-26 – 2019-03-30 (×5): 220 mg via ORAL
  Filled 2019-03-26 (×5): qty 1

## 2019-03-26 MED ORDER — IPRATROPIUM-ALBUTEROL 20-100 MCG/ACT IN AERS: 2.0000 | INHALATION_SPRAY | Freq: Four times a day (QID) | RESPIRATORY_TRACT | Status: DC

## 2019-03-26 MED ORDER — GUAIFENESIN-DM 100-10 MG/5ML PO SYRP: 10.0000 mL | ORAL_SOLUTION | ORAL | Status: DC | PRN

## 2019-03-26 MED ORDER — INSULIN ASPART 100 UNIT/ML ~~LOC~~ SOLN
0.0000 [IU] | SUBCUTANEOUS | Status: DC
  Administered 2019-03-26 (×2): 3 [IU] via SUBCUTANEOUS
  Administered 2019-03-26 – 2019-03-27 (×2): 5 [IU] via SUBCUTANEOUS
  Administered 2019-03-27: 3 [IU] via SUBCUTANEOUS
  Administered 2019-03-27: 8 [IU] via SUBCUTANEOUS
  Administered 2019-03-27 (×2): 3 [IU] via SUBCUTANEOUS
  Filled 2019-03-26 (×4): qty 1

## 2019-03-26 MED ORDER — INSULIN DETEMIR 100 UNIT/ML ~~LOC~~ SOLN
0.0750 [IU]/kg | Freq: Two times a day (BID) | SUBCUTANEOUS | Status: DC
  Administered 2019-03-26 – 2019-03-29 (×7): 8 [IU] via SUBCUTANEOUS
  Filled 2019-03-26 (×10): qty 0.08

## 2019-03-26 MED ORDER — METHYLPREDNISOLONE SODIUM SUCC 125 MG IJ SOLR
125.0000 mg | Freq: Once | INTRAMUSCULAR | Status: AC
  Administered 2019-03-26: 125 mg via INTRAVENOUS
  Filled 2019-03-26: qty 2

## 2019-03-26 MED ORDER — SODIUM CHLORIDE 0.9 % IV SOLN
2.0000 g | INTRAVENOUS | Status: DC
  Administered 2019-03-26 – 2019-03-29 (×4): 2 g via INTRAVENOUS
  Filled 2019-03-26 (×4): qty 20

## 2019-03-26 MED ORDER — FAMOTIDINE 20 MG PO TABS
20.0000 mg | ORAL_TABLET | Freq: Every day | ORAL | Status: DC
  Administered 2019-03-26 – 2019-03-30 (×5): 20 mg via ORAL
  Filled 2019-03-26 (×5): qty 1

## 2019-03-26 MED ORDER — CALCITRIOL 0.25 MCG PO CAPS
1.0000 ug | ORAL_CAPSULE | ORAL | Status: DC
  Administered 2019-03-30: 10:00:00 1 ug via ORAL

## 2019-03-26 MED ORDER — AMLODIPINE BESYLATE 10 MG PO TABS
10.0000 mg | ORAL_TABLET | Freq: Every day | ORAL | Status: DC
  Administered 2019-03-26 – 2019-03-30 (×5): 10 mg via ORAL
  Filled 2019-03-26: qty 2
  Filled 2019-03-26 (×4): qty 1

## 2019-03-26 MED ORDER — LABETALOL HCL 200 MG PO TABS
200.0000 mg | ORAL_TABLET | Freq: Two times a day (BID) | ORAL | Status: DC
  Administered 2019-03-26 – 2019-03-30 (×9): 200 mg via ORAL
  Filled 2019-03-26 (×9): qty 1

## 2019-03-26 MED ORDER — SODIUM CHLORIDE 0.9 % IV SOLN
200.0000 mg | Freq: Once | INTRAVENOUS | Status: AC
  Administered 2019-03-26: 200 mg via INTRAVENOUS
  Filled 2019-03-26: qty 40

## 2019-03-26 MED ORDER — HYDROCOD POLST-CPM POLST ER 10-8 MG/5ML PO SUER: 5.0000 mL | Freq: Two times a day (BID) | ORAL | Status: DC | PRN

## 2019-03-26 NOTE — ED Notes (Signed)
Pt given dinner tray.

## 2019-03-26 NOTE — Progress Notes (Signed)
PROGRESS NOTE    Janet Mitchell  JQB:[id number] DOB: 15-Sep-1965 DOA: 03/25/2019 PCP: Lucianne Lei, MD     Brief Narrative:  As per H&P written by Dr. Olevia Bowens on 03/25/2019 53 y.o. female with medical history significant of anemia due to ESRD, history of blood transfusion, CHF, chronic cholecystitis with calculus, ESRD on PD, history of headaches, history of hypertension, spinal headaches, type 2 diabetes mellitus who is coming to the emergency department due to nausea, vomiting and diarrhea for about a month.  However, the patient states that since very early in the year she has been having occasional nausea, vomiting and loose stools.  She has had more loose stools post cholecystectomy.  She also has been coughing and this sometimes triggers emesis.  However, over the past 3 to 4 weeks this pattern has progressively worsened and she now is having a lot more symptomatology.  She went to see her gastroenterologist who prescribed Levsin, but has not been able to start the medication.  She has been using Imodium without significant results.  She denies abdominal pain, melena or hematochezia.   She denies fever, chills, but feels very fatigued.She rarely urinates.  No dysuria, frequency hematuria.  No chest pain, palpitations, diaphoresis, but gets occasionally lightheaded.  ED Course: Initial vital signs temperature 98 F, pulse 77, respirations 16, blood pressure 118/71 mmHg and O2 sat 88% on room air.  The patient received a 500 mL bolus, KCl 10 mEq IVPB x1, famotidine IV, ceftriaxone and Zithromax in the emergency department.  Assessment & Plan: 1-acute COVID-19 pneumonia with mild hypoxia -Patient reporting easy fatigability, general malaise, nonproductive cough and a slight shortness of breath on exertion. -On room air on admission O2 sat 88%, good improvement to 9698 on 1 L nasal cannula supplementation. -Continue IV steroids -Continue IV remdesivir -Wean oxygen supplementation as  tolerated -Continue as needed antitussives and bronchodilators -Follow inflammatory markers.  2-end-stage renal disease on peritoneal dialysis -Patient will be transfer to [facility] to pursued continuation of PD -Follow nephrology recommendations.  3-type 2 diabetes with nephropathy -Continue sliding scale insulin and Levemir -Follow CBGs and adjust hypoglycemic regimen as needed -Steroids can cause blood sugar to be elevated. -A1C 7.8, 2 months ago (January 03, 2019).  4-dehydration/hyponatremia/hypokalemia -Gentle hydration provided -Electrolytes repletion started -Follow trend and further recommendations by nephrology during dialysis for further repletion.  5-anemia of chronic renal disease -Hemoglobin stable -No transfusion needed -IV iron and Epogen therapy as per nephrology discretion.  6-hypertension -Appears to be stable and well-controlled -Will continue amlodipine and labetalol regimen -Follow vital signs.  7-gastroesophageal reflux disease as well as GI protection -Continue the use of Pepcid  DVT prophylaxis: Lovenox Code Status: Full code Family Communication: No family at bedside. Disposition Plan: Remains inpatient, continue gentle hydration and electrolyte repletion.  Peritoneal dialysis per nephrology service recommendations.  Continue IV remdesivir and the steroids.  Wean oxygen supplementation as tolerated.  Follow inflammatory markers.  Consultants:   Nephrology service (will still need re-consultation when patient arrived to [facility])  Procedures:   See below for x-ray reports  Antimicrobials:  Anti-infectives (From admission, onward)   Start     Dose/Rate Route Frequency Ordered Stop   03/27/19 1000  remdesivir 100 mg in sodium chloride 0.9 % 100 mL IVPB     100 mg 200 mL/hr over 30 Minutes Intravenous Daily 03/26/19 0108 03/31/19 0959   03/26/19 2200  cefTRIAXone (ROCEPHIN) 2 g in sodium chloride 0.9 % 100 mL IVPB  2 g 200 mL/hr over  30 Minutes Intravenous Every 24 hours 03/26/19 0339     03/26/19 0115  remdesivir 200 mg in sodium chloride 0.9% 250 mL IVPB     200 mg 580 mL/hr over 30 Minutes Intravenous Once 03/26/19 0108 03/26/19 0515   03/25/19 2215  cefTRIAXone (ROCEPHIN) 1 g in sodium chloride 0.9 % 100 mL IVPB     1 g 200 mL/hr over 30 Minutes Intravenous  Once 03/25/19 2210 03/25/19 2333   03/25/19 2215  azithromycin (ZITHROMAX) 500 mg in sodium chloride 0.9 % 250 mL IVPB     500 mg 250 mL/hr over 60 Minutes Intravenous  Once 03/25/19 2210 03/26/19 0311      Subjective: No fever, no chest pain, currently denying nausea or vomiting.  Patient reports easy fatigability, loose stools and nonproductive cough.  Using 1 L nasal cannula supplementation.  Objective: Vitals:   03/25/19 1722 03/25/19 1726 03/26/19 0100 03/26/19 0901  BP: 118/71  123/75 (!) 145/85  Pulse: 77  81 83  Resp: 16  16   Temp: 98 F (36.7 C)     TempSrc: Oral     SpO2: (!) 88%  99% 93%  Weight: 113.4 kg 113.3 kg    Height: 5\' 8"  (1.727 m)      No intake or output data in the 24 hours ending 03/26/19 0919 Filed Weights   03/25/19 1722 03/25/19 1726  Weight: 113.4 kg 113.3 kg    Examination: General exam: Alert, awake, oriented x 3; denying chest pain, abdominal pain, nausea or vomiting currently.  Still having loose stools and complaining of general malaise, easy fatigability and nonproductive cough.  Using 1 L nasal cannula supplementation. Respiratory system: Good air movement bilaterally, no wheezing, no crackles; no using accessory muscles.  Normal respiratory effort.  Positive scattered rhonchi's. Cardiovascular system: RRR. No murmurs, rubs, gallops. Gastrointestinal system: Abdomen is obese, nondistended, soft and nontender. No organomegaly or masses felt. Normal bowel sounds heard. Central nervous system: Alert and oriented. No focal neurological deficits. Extremities: No cyanosis or clubbing Skin: No rashes, no  petechiae. Psychiatry: Judgement and insight appear normal. Mood & affect appropriate.     Data Reviewed: I have personally reviewed following labs and imaging studies  CBC: Recent Labs  Lab 03/25/19 1835  WBC 7.5  NEUTROABS 6.1  HGB 11.3*  HCT 36.2  MCV 71.3*  PLT 347   Basic Metabolic Panel: Recent Labs  Lab 03/25/19 1835  NA 127*  K 3.0*  CL 86*  CO2 26  GLUCOSE 142*  BUN 44*  CREATININE 9.53*  CALCIUM 7.5*   GFR: Estimated Creatinine Clearance: 9.2 mL/min (A) (by C-G formula based on SCr of 9.53 mg/dL (H)).   Liver Function Tests: Recent Labs  Lab 03/25/19 1835  AST 17  ALT 6  ALKPHOS 102  BILITOT 0.7  PROT 7.1  ALBUMIN 1.8*   CBG: Recent Labs  Lab 03/26/19 0218 03/26/19 0609 03/26/19 0803  GLUCAP 96 147* 179*   Anemia Panel: Recent Labs    03/25/19 1835  FERRITIN 1,474*   Urine analysis:    Component Value Date/Time   COLORURINE AMBER (A) 07/31/2018 1800   APPEARANCEUR TURBID (A) 07/31/2018 1800   LABSPEC >1.046 (H) 07/31/2018 1800   PHURINE 5.0 07/31/2018 1800   GLUCOSEU 150 (A) 07/31/2018 1800   HGBUR SMALL (A) 07/31/2018 1800   BILIRUBINUR NEGATIVE 07/31/2018 1800   KETONESUR NEGATIVE 07/31/2018 1800   PROTEINUR >=300 (A) 07/31/2018 1800   NITRITE NEGATIVE  07/31/2018 1800   LEUKOCYTESUR MODERATE (A) 07/31/2018 1800    Recent Results (from the past 240 hour(s))  Respiratory Panel by RT PCR (Flu A&B, Covid) - Nasopharyngeal Swab     Status: Abnormal   Collection Time: 03/25/19 11:32 PM   Specimen: Nasopharyngeal Swab  Result Value Ref Range Status   SARS Coronavirus 2 by RT PCR POSITIVE (A) NEGATIVE Final    Comment: RESULT CALLED TO, READ BACK BY AND VERIFIED WITH: EASTER,T. AT 0031 ON 03/26/2019 BY EVA (NOTE) SARS-CoV-2 target nucleic acids are DETECTED. SARS-CoV-2 RNA is generally detectable in upper respiratory specimens  during the acute phase of infection. Positive results are indicative of the presence of the  identified virus, but do not rule out bacterial infection or co-infection with other pathogens not detected by the test. Clinical correlation with patient history and other diagnostic information is necessary to determine patient infection status. The expected result is Negative. Fact Sheet for Patients:  PinkCheek.be Fact Sheet for Healthcare Providers: GravelBags.it This test is not yet approved or cleared by the [place] FDA and  has been authorized for detection and/or diagnosis of SARS-CoV-2 by FDA under an Emergency Use Authorization (EUA).  This EUA will remain in effect (meaning this test can be used ) for the duration of  the COVID-19 declaration under Section 564(b)(1) of the Act, 21 U.S.C. section 360bbb-3(b)(1), unless the authorization is terminated or revoked sooner.    Influenza A by PCR NEGATIVE NEGATIVE Final   Influenza B by PCR NEGATIVE NEGATIVE Final    Comment: (NOTE) The Xpert Xpress SARS-CoV-2/FLU/RSV assay is intended as an aid in  the diagnosis of influenza from Nasopharyngeal swab specimens and  should not be used as a sole basis for treatment. Nasal washings and  aspirates are unacceptable for Xpert Xpress SARS-CoV-2/FLU/RSV  testing. Fact Sheet for Patients: PinkCheek.be Fact Sheet for Healthcare Providers: GravelBags.it This test is not yet approved or cleared by the [place] FDA and  has been authorized for detection and/or diagnosis of SARS-CoV-2 by  FDA under an Emergency Use Authorization (EUA). This EUA will remain  in effect (meaning this test can be used) for the duration of the  Covid-19 declaration under Section 564(b)(1) of the Act, 21  U.S.C. section 360bbb-3(b)(1), unless the authorization is  terminated or revoked. Performed at [facility], [address], [place], [place] [address]     Radiology Studies: Acute  Abd Series  Result Date: 03/25/2019 CLINICAL DATA:  Vomiting and diarrhea EXAM: DG ABDOMEN ACUTE W/ 1V CHEST COMPARISON:  03/24/2018 FINDINGS: Extensive diffuse bilateral airspace disease, with progression from the prior study. Upper and lower lobe airspace disease bilaterally. No effusion. Heart size upper normal. Normal bowel gas pattern.  No obstruction or ileus.  No free air. Surgical clips in the gallbladder fossa. Fracture fixation of left acetabular fracture with metal plates and screws. IMPRESSION: Diffuse bilateral airspace disease, with progression. There appears to be an element of chronic lung disease with progression. Possible superimposed pneumonia. Normal bowel gas pattern. Electronically Signed   By: Franchot Gallo M.D.   On: 03/25/2019 21:12    Scheduled Meds:  vitamin C  500 mg Oral Daily   famotidine  20 mg Oral Daily   insulin aspart  0-15 Units Subcutaneous Q4H   insulin detemir  0.075 Units/kg Subcutaneous BID   Ipratropium-Albuterol  2 puff Inhalation Q6H   methylPREDNISolone (SOLU-MEDROL) injection  0.5 mg/kg Intravenous Q12H   zinc sulfate  220 mg Oral Daily  Continuous Infusions:  cefTRIAXone (ROCEPHIN)  IV     [START ON 03/27/2019] remdesivir 100 mg in NS 100 mL       LOS: 0 days    Time spent: 35 minutes. Greater than 50% of this time was spent in direct contact with the patient, coordinating care and discussing relevant ongoing clinical issues, including coronavirus infection, anticipated treatment and the need for transfer to [facility]) to continue peritoneal dialysis.  Patient expressed general malaise, feeling fatigue and having nonproductive cough.  Currently denies any nausea or vomiting and no abdominal pain.  Reports still having some loose stools and is using 1 L nasal cannula supplementation maintaining good O2 sats above 96% with it.   Barton Dubois, MD [facility] Pager [phone]   03/26/2019, 9:19 AM

## 2019-03-26 NOTE — ED Notes (Signed)
AC contacted to get med from main pharmacy

## 2019-03-27 ENCOUNTER — Encounter (HOSPITAL_COMMUNITY): Payer: Self-pay | Admitting: Internal Medicine

## 2019-03-27 DIAGNOSIS — E1122 Type 2 diabetes mellitus with diabetic chronic kidney disease: Secondary | ICD-10-CM

## 2019-03-27 DIAGNOSIS — K529 Noninfective gastroenteritis and colitis, unspecified: Secondary | ICD-10-CM

## 2019-03-27 DIAGNOSIS — N186 End stage renal disease: Secondary | ICD-10-CM

## 2019-03-27 DIAGNOSIS — Z794 Long term (current) use of insulin: Secondary | ICD-10-CM

## 2019-03-27 DIAGNOSIS — I10 Essential (primary) hypertension: Secondary | ICD-10-CM

## 2019-03-27 DIAGNOSIS — E871 Hypo-osmolality and hyponatremia: Secondary | ICD-10-CM

## 2019-03-27 DIAGNOSIS — D631 Anemia in chronic kidney disease: Secondary | ICD-10-CM

## 2019-03-27 DIAGNOSIS — Z992 Dependence on renal dialysis: Secondary | ICD-10-CM

## 2019-03-27 LAB — RENAL FUNCTION PANEL
Albumin: 1.7 g/dL — ABNORMAL LOW (ref 3.5–5.0)
Anion gap: 21 — ABNORMAL HIGH (ref 5–15)
BUN: 69 mg/dL — ABNORMAL HIGH (ref 6–20)
CO2: 19 mmol/L — ABNORMAL LOW (ref 22–32)
Calcium: 7.8 mg/dL — ABNORMAL LOW (ref 8.9–10.3)
Chloride: 89 mmol/L — ABNORMAL LOW (ref 98–111)
Creatinine, Ser: 11.88 mg/dL — ABNORMAL HIGH (ref 0.44–1.00)
GFR calc Af Amer: 4 mL/min — ABNORMAL LOW (ref >60)
GFR calc non Af Amer: 3 mL/min — ABNORMAL LOW (ref >60)
Glucose, Bld: 100 mg/dL — ABNORMAL HIGH (ref 70–99)
Phosphorus: 7 mg/dL — ABNORMAL HIGH (ref 2.5–4.6)
Potassium: 3.7 mmol/L (ref 3.5–5.1)
Sodium: 129 mmol/L — ABNORMAL LOW (ref 135–145)

## 2019-03-27 LAB — GLUCOSE, CAPILLARY
Glucose-Capillary: 191 mg/dL — ABNORMAL HIGH (ref 70–99)
Glucose-Capillary: 170 mg/dL — ABNORMAL HIGH (ref 70–99)
Glucose-Capillary: 155 mg/dL — ABNORMAL HIGH (ref 70–99)
Glucose-Capillary: 84 mg/dL (ref 70–99)
Glucose-Capillary: 140 mg/dL — ABNORMAL HIGH (ref 70–99)
Glucose-Capillary: 274 mg/dL — ABNORMAL HIGH (ref 70–99)

## 2019-03-27 LAB — PROCALCITONIN: Procalcitonin: 1.8 ng/mL

## 2019-03-27 LAB — CBG MONITORING, ED: Glucose-Capillary: 249 mg/dL — ABNORMAL HIGH (ref 70–99)

## 2019-03-27 MED ORDER — HEPARIN 1000 UNIT/ML FOR PERITONEAL DIALYSIS: 500.0000 [IU] | INTRAMUSCULAR | Status: DC | PRN

## 2019-03-27 MED ORDER — INSULIN ASPART 100 UNIT/ML ~~LOC~~ SOLN
0.0000 [IU] | Freq: Three times a day (TID) | SUBCUTANEOUS | Status: DC
  Administered 2019-03-28: 15 [IU] via SUBCUTANEOUS
  Administered 2019-03-28: 3 [IU] via SUBCUTANEOUS
  Administered 2019-03-29: 08:00:00 15 [IU] via SUBCUTANEOUS
  Administered 2019-03-29: 13:00:00 8 [IU] via SUBCUTANEOUS
  Administered 2019-03-29: 18:00:00 5 [IU] via SUBCUTANEOUS
  Administered 2019-03-30 (×2): 15 [IU] via SUBCUTANEOUS

## 2019-03-27 MED ORDER — HEPARIN SODIUM (PORCINE) 10000 UNIT/ML IJ SOLN
7500.0000 [IU] | Freq: Three times a day (TID) | INTRAMUSCULAR | Status: DC
  Administered 2019-03-27 – 2019-03-30 (×8): 7500 [IU] via SUBCUTANEOUS
  Filled 2019-03-27 (×10): qty 1

## 2019-03-27 MED ORDER — DELFLEX-LC/1.5% DEXTROSE 344 MOSM/L IP SOLN
INTRAPERITONEAL | Status: DC
  Administered 2019-03-27: 5000 mL via INTRAPERITONEAL

## 2019-03-27 MED ORDER — LOPERAMIDE HCL 2 MG PO CAPS
2.0000 mg | ORAL_CAPSULE | Freq: Once | ORAL | Status: AC
  Administered 2019-03-27: 23:00:00 2 mg via ORAL
  Filled 2019-03-27: qty 1

## 2019-03-27 MED ORDER — GENTAMICIN SULFATE 0.1 % EX CREA
1.0000 "application " | TOPICAL_CREAM | Freq: Every day | CUTANEOUS | Status: DC
  Administered 2019-03-27 – 2019-03-30 (×6): 1 via TOPICAL
  Filled 2019-03-27: qty 15

## 2019-03-27 MED ORDER — SODIUM CHLORIDE 0.9 % IV SOLN: INTRAVENOUS | Status: DC | PRN

## 2019-03-27 MED ORDER — HEPARIN 1000 UNIT/ML FOR PERITONEAL DIALYSIS
INTRAPERITONEAL | Status: DC | PRN
  Filled 2019-03-27: qty 5000

## 2019-03-27 MED ORDER — ENSURE ENLIVE PO LIQD: 237.0000 mL | Freq: Two times a day (BID) | ORAL | Status: DC

## 2019-03-27 MED ORDER — PRO-STAT SUGAR FREE PO LIQD
30.0000 mL | Freq: Two times a day (BID) | ORAL | Status: DC
  Administered 2019-03-28 – 2019-03-30 (×4): 30 mL via ORAL
  Filled 2019-03-27 (×6): qty 30

## 2019-03-27 MED ORDER — RENA-VITE PO TABS
1.0000 | ORAL_TABLET | Freq: Every day | ORAL | Status: DC
  Administered 2019-03-27 – 2019-03-29 (×3): 1 via ORAL
  Filled 2019-03-27 (×3): qty 1

## 2019-03-27 NOTE — Progress Notes (Signed)
[facility]  PROGRESS NOTE  Janet Mitchell PJA:[id number] DOB: 1965/08/11 DOA: 03/25/2019 PCP: Lucianne Lei, MD   Brief HPI:   53 year old female with history of anemia due to ESRD, history of blood transfusion, CHF, chronic cholecystitis, ESRD on peritoneal dialysis, history of hypertension, spinal headaches, type 2 diabetes mellitus came to ED with complaints of nausea vomiting and diarrhea for about a month.  Patient states that she started having loose stools post cholecystectomy.  She was prescribed Levsin by her gastroenterologist but has not yet started the medication.  She has been using Imodium without significant relief.  Denies abdominal pain, melena or hematochezia.  Denies fever or chills.    Subjective   Patient seen and examined, denies shortness of breath.   Assessment/Plan:     1. Acute respiratory failure with hypoxia due to SARS COVID-19 pneumonia-patient O2 sats are 97% on 2 L/min of oxygen, patient started on remdesivir, Solu-Medrol.  Patient has elevated procalcitonin 1.70, also started on ceftriaxone empirically for superimposed pneumonia.  We will continue with ceftriaxone.  Repeat procalcitonin, CRP in a.m.   2. End-stage renal disease on peritoneal dialysis-we will consult nephrology for peritoneal dialysis.  3. Type 2 diabetes mellitus with nephropathy-continue sliding scale insulin with NovoLog, Levemir.  4. Nausea, vomiting/dehydration-resolved, patient was started on gentle IV hydration.  Will avoid further hydration as patient has ESRD on PD.  Follow nephrology recommendations.  Nausea and vomiting has resolved.  5. Anemia of chronic disease-patient has any medical ESRD, IV iron and Epogen therapy as per nephrology discretion.  6. Hypertension-blood pressure stable, continue manidipine, labetalol.  7. GERD-continue Pepcid  SpO2: 97 % O2 Flow Rate (L/min): 2 L/min   COVID-19 Labs  Recent Labs    03/25/19 1835 03/26/19 0146  DDIMER  --   2.97*  FERRITIN 1,474*  --   LDH  --  248*  CRP 27.9*  --     Lab Results  Component Value Date   SARSCOV2NAA POSITIVE (A) 03/25/2019   [place] Not Detected 03/05/2019   [place] NEGATIVE 12/18/2018   [place] NEGATIVE 10/19/2018     CBG: Recent Labs  Lab 03/26/19 2020 03/27/19 0056 03/27/19 0532 03/27/19 0805 03/27/19 1212  GLUCAP 120* 249* 191* 170* 155*    CBC: Recent Labs  Lab 03/25/19 1835  WBC 7.5  NEUTROABS 6.1  HGB 11.3*  HCT 36.2  MCV 71.3*  PLT 341    Basic Metabolic Panel: Recent Labs  Lab 03/25/19 1835  NA 127*  K 3.0*  CL 86*  CO2 26  GLUCOSE 142*  BUN 44*  CREATININE 9.53*  CALCIUM 7.5*     Liver Function Tests: Recent Labs  Lab 03/25/19 1835  AST 17  ALT 6  ALKPHOS 102  BILITOT 0.7  PROT 7.1  ALBUMIN 1.8*        DVT prophylaxis: Heparin  Code Status: Full code  Family Communication: No family at bedside  Disposition Plan: likely home when medically ready for discharge         Scheduled medications:  . amLODipine  10 mg Oral Daily  . vitamin C  500 mg Oral Daily  . calcitRIOL  0.5 mcg Oral Once per day on Mon Tue Wed Thu Fri  . [START ON 03/30/2019] calcitRIOL  1 mcg Oral Once per day on Sun Sat  . famotidine  20 mg Oral Daily  . feeding supplement (PRO-STAT SUGAR FREE 64)  30 mL Oral BID  . heparin injection (subcutaneous)  7,500 Units Subcutaneous  Q8H  . insulin aspart  0-15 Units Subcutaneous Q4H  . insulin detemir  0.075 Units/kg Subcutaneous BID  . Ipratropium-Albuterol  2 puff Inhalation Q6H  . labetalol  200 mg Oral BID  . methylPREDNISolone (SOLU-MEDROL) injection  0.5 mg/kg Intravenous Q12H  . multivitamin  1 tablet Oral QHS  . zinc sulfate  220 mg Oral Daily       Antibiotics:   Anti-infectives (From admission, onward)   Start     Dose/Rate Route Frequency Ordered Stop   03/27/19 1000  remdesivir 100 mg in sodium chloride 0.9 % 100 mL IVPB     100 mg 200 mL/hr over 30 Minutes  Intravenous Daily 03/26/19 0108 03/31/19 0959   03/26/19 2200  cefTRIAXone (ROCEPHIN) 2 g in sodium chloride 0.9 % 100 mL IVPB     2 g 200 mL/hr over 30 Minutes Intravenous Every 24 hours 03/26/19 0339     03/26/19 0115  remdesivir 200 mg in sodium chloride 0.9% 250 mL IVPB     200 mg 580 mL/hr over 30 Minutes Intravenous Once 03/26/19 0108 03/26/19 1005   03/25/19 2215  cefTRIAXone (ROCEPHIN) 1 g in sodium chloride 0.9 % 100 mL IVPB     1 g 200 mL/hr over 30 Minutes Intravenous  Once 03/25/19 2210 03/25/19 2333   03/25/19 2215  azithromycin (ZITHROMAX) 500 mg in sodium chloride 0.9 % 250 mL IVPB     500 mg 250 mL/hr over 60 Minutes Intravenous  Once 03/25/19 2210 03/26/19 0311       Objective   Vitals:   03/27/19 0527 03/27/19 0800 03/27/19 0829 03/27/19 0831  BP: 129/63 134/68 134/68 134/68  Pulse: 71   76  Resp: 15     Temp: 97.9 F (36.6 C)     TempSrc: Oral     SpO2: 97%     Weight:      Height:        Intake/Output Summary (Last 24 hours) at 03/27/2019 1410 Last data filed at 03/27/2019 1100 Gross per 24 hour  Intake 220 ml  Output -  Net 220 ml    12/28 1901 - 12/30 0700 In: 100  Out: -   Filed Weights   03/25/19 1722 03/25/19 1726  Weight: 113.4 kg 113.3 kg    Physical Examination:    General-appears in no acute distress  Heart-S1-S2, regular, no murmur auscultated  Lungs-normal respiratory effort, bibasilar crackles auscultated  Abdomen-soft, nontender, no organomegaly  Extremities-no edema in the lower extremities  Neuro-alert, oriented x3, no focal deficit noted    Data Reviewed:   Recent Results (from the past 240 hour(s))  Respiratory Panel by RT PCR (Flu A&B, Covid) - Nasopharyngeal Swab     Status: Abnormal   Collection Time: 03/25/19 11:32 PM   Specimen: Nasopharyngeal Swab  Result Value Ref Range Status   SARS Coronavirus 2 by RT PCR POSITIVE (A) NEGATIVE Final    Comment: RESULT CALLED TO, READ BACK BY AND VERIFIED  WITH: EASTER,T. AT 0031 ON 03/26/2019 BY EVA (NOTE) SARS-CoV-2 target nucleic acids are DETECTED. SARS-CoV-2 RNA is generally detectable in upper respiratory specimens  during the acute phase of infection. Positive results are indicative of the presence of the identified virus, but do not rule out bacterial infection or co-infection with other pathogens not detected by the test. Clinical correlation with patient history and other diagnostic information is necessary to determine patient infection status. The expected result is Negative. Fact Sheet for Patients:  PinkCheek.be Fact Sheet  for Healthcare Providers: GravelBags.it This test is not yet approved or cleared by the [place] and  has been authorized for detection and/or diagnosis of SARS-CoV-2 by FDA under an Emergency Use Authorization (EUA).  This EUA will remain in effect (meaning this test can be used ) for the duration of  the COVID-19 declaration under Section 564(b)(1) of the Act, 21 U.S.C. section 360bbb-3(b)(1), unless the authorization is terminated or revoked sooner.    Influenza A by PCR NEGATIVE NEGATIVE Final   Influenza B by PCR NEGATIVE NEGATIVE Final    Comment: (NOTE) The Xpert Xpress SARS-CoV-2/FLU/RSV assay is intended as an aid in  the diagnosis of influenza from Nasopharyngeal swab specimens and  should not be used as a sole basis for treatment. Nasal washings and  aspirates are unacceptable for Xpert Xpress SARS-CoV-2/FLU/RSV  testing. Fact Sheet for Patients: PinkCheek.be Fact Sheet for Healthcare Providers: GravelBags.it This test is not yet approved or cleared by the [place] FDA and  has been authorized for detection and/or diagnosis of SARS-CoV-2 by  FDA under an Emergency Use Authorization (EUA). This EUA will remain  in effect (meaning this test can be used) for the  duration of the  Covid-19 declaration under Section 564(b)(1) of the Act, 21  U.S.C. section 360bbb-3(b)(1), unless the authorization is  terminated or revoked. Performed at [facility], [address] [place], [place] [address]       Studies:  Acute Abd Series  Result Date: 03/25/2019 CLINICAL DATA:  Vomiting and diarrhea EXAM: DG ABDOMEN ACUTE W/ 1V CHEST COMPARISON:  03/24/2018 FINDINGS: Extensive diffuse bilateral airspace disease, with progression from the prior study. Upper and lower lobe airspace disease bilaterally. No effusion. Heart size upper normal. Normal bowel gas pattern.  No obstruction or ileus.  No free air. Surgical clips in the gallbladder fossa. Fracture fixation of left acetabular fracture with metal plates and screws. IMPRESSION: Diffuse bilateral airspace disease, with progression. There appears to be an element of chronic lung disease with progression. Possible superimposed pneumonia. Normal bowel gas pattern. Electronically Signed   By: Franchot Gallo M.D.   On: 03/25/2019 21:12     Admission status: Inpatient: Based on patients clinical presentation and evaluation of above clinical data, I have made determination that patient meets Inpatient criteria at this time.   Oswald Hillock   [facility] If 7PM-7AM, please contact night-coverage at [email or web address], Office  [phone]  password [place]  03/27/2019, 2:10 PM  LOS: 1 day

## 2019-03-27 NOTE — Plan of Care (Signed)
  Problem: Education: Goal: Knowledge of General Education information will improve Description Including pain rating scale, medication(s)/side effects and non-pharmacologic comfort measures Outcome: Progressing   Problem: Health Behavior/Discharge Planning: Goal: Ability to manage health-related needs will improve Outcome: Progressing   

## 2019-03-27 NOTE — Consult Note (Signed)
Megin Consalvo Pinnix Admit Date: 03/25/2019 03/27/2019 Rexene Agent Requesting Physician:  Darrick Meigs MD  Reason for Consult:  ESRD, COVID19 infection HPI:  11F ESRD on PD [facility] Rolly Salter MD) presented to [facility] ED 12/28 with AoC N/V nd diarrhea.  Diagnosed with COVID19 and tranferred to [facility] for ongoing PD.    Did not see [place] at [facility]. Last PD was night of 12/27.  Last labs 12/28, stable.  She recalls doing 2.5L x 4 (184mn dwells) and a LF of icodextrin.  No pause.  Treating here with steriods and remdesivir.  On RA at time of my eval, breathing comfortably.    Denies abd pain, fever, or cloudy effluent  Other PMH of DM2, HTN, GERD, anemia, chronic cholecystitis.  On PD since 2017.    Creatinine, Ser (mg/dL)  Date Value  03/25/2019 9.53 (H)  12/19/2018 10.48 (H)  12/18/2018 8.67 (H)  12/18/2018 8.15 (H)  12/14/2018 8.44 (H)  07/31/2018 15.08 (H)  03/26/2018 15.46 (H)  03/25/2018 16.28 (H)  03/24/2018 16.08 (H)  03/11/2018 14.88 (H)  ]  Balance of 12 systems is negative w/ exceptions as above  PMH  Past Medical History:  Diagnosis Date  . Anemia   . CH[facility] (congestive heart failure) [facility]   . Chronic cholecystitis with calculus   . ESRD (end stage renal disease) on dialysis ([facility]    "peritoneal dialysis q hs" (03/09/2018)  . Headache    "a few/wk" (03/09/2018)  . History of blood transfusion 10/2017   "low blood count" (03/09/2018)  . Hypertension   . Spinal headache   . Type II diabetes mellitus (HCC)    PSH  Past Surgical History:  Procedure Laterality Date  . AMPUTATION TOE Left 2013   Great toe  . BIOPSY  10/24/2018   Procedure: BIOPSY;  Surgeon: RDaneil Dolin MD;  [facility];  Service: Endoscopy;;  right and left colon  . CATARACT EXTRACTION W/ INTRAOCULAR LENS IMPLANT Right   . CESAREAN SECTION  1994; 1998  . CHOLECYSTECTOMY N/A 03/09/2018   Procedure: LAPAROSCOPIC CHOLECYSTECTOMY WITH INTRAOPERATIVE CHOLANGIOGRAM ERAS PATHWAY;  Surgeon:  TDonnie Mesa MD;  [place]  Service: General;  Laterality: N/A;  . COLONOSCOPY N/A 10/24/2018   Procedure: COLONOSCOPY;  Surgeon: RDaneil Dolin MD;  Location: [facility];  Service: Endoscopy;  Laterality: N/A;  1:00pm  . EYE SURGERY  05/15/2018   Removal of blood in the globe (due to DM)  . FLEXIBLE SIGMOIDOSCOPY N/A 11/24/2017   Procedure: FLEXIBLE SIGMOIDOSCOPY;  Surgeon: RDaneil Dolin MD;  Location: [facility];  Service: Endoscopy;  Laterality: N/A;  . LAPAROSCOPIC CHOLECYSTECTOMY  03/09/2018  . PERITONEAL CATHETER INSERTION  2017  . POLYPECTOMY  10/24/2018   Procedure: POLYPECTOMY;  Surgeon: RDaneil Dolin MD;  Location[facility];  Service: Endoscopy;;  . TOTAL HIP ARTHROPLASTY Left 1997   FH  Family History  Problem Relation Age of Onset  . Heart disease Mother   . Thrombocytopenia Mother        TTP  . Heart failure Father   . Kidney disease Paternal Grandfather   . Colon cancer Neg Hx    SH  reports that she is a non-smoker but has been exposed to tobacco smoke. She has never used smokeless tobacco. She reports that she does not drink alcohol or use drugs. Allergies No Known Allergies Home medications Prior to Admission medications   Medication Sig Start Date End Date Taking? Authorizing Provider  ALPRAZolam (  XANAX) 0.5 MG tablet Take 0.5 mg by mouth at bedtime as needed for anxiety.   Yes [provider]  amLODipine (NORVASC) 10 MG tablet Take 1 tablet (10 mg total) by mouth daily. 11/09/17  Yes Emokpae, Courage, MD  aspirin EC 81 MG tablet Take 81 mg by mouth daily.   Yes [provider]  calcitRIOL (ROCALTROL) 0.5 MCG capsule Take 0.5-1 mcg by mouth See admin instructions. 0.57mg on Mondays through Fridays and take 149m on Saturdays and Sundays   Yes [provider]  doxazosin (CARDURA) 8 MG tablet Take 8 mg by mouth at bedtime.   Yes [provider]  Insulin Glargine, 2 Unit Dial, 300 UNIT/ML SOPN Inject 20 Units  into the skin at bedtime. 02/28/19  Yes Nida, GeMarella ChimesMD  labetalol (NORMODYNE) 200 MG tablet Take 200 mg by mouth 2 (two) times daily.   Yes [provider]  losartan (COZAAR) 100 MG tablet Take 100 mg by mouth daily.   Yes [provider]  Blood Glucose Monitoring Suppl (ACCU-CHEK GUIDE) w/Device KIT 1 Piece by Does not apply route as directed. Patient not taking: Reported on 03/26/2019 02/28/19   NiCassandria AngerMD  Crisaborole (EUCRISA) 2 % OINT Apply 1 application topically daily as needed (for rash/irritation).    [provider]  glucose blood (ACCU-CHEK GUIDE) test strip Use as instructed Patient not taking: Reported on 03/26/2019 02/28/19   NiCassandria AngerMD  Hyoscyamine Sulfate SL (LEVSIN/SL) 0.125 MG SUBL Place 0.125 mg under the tongue 3 (three) times daily as needed (diarrhea). Patient not taking: Reported on 03/26/2019 03/19/19   GiCarlis StableNP    Current Medications Scheduled Meds: . amLODipine  10 mg Oral Daily  . vitamin C  500 mg Oral Daily  . calcitRIOL  0.5 mcg Oral Once per day on Mon Tue Wed Thu Fri  . [START ON 03/30/2019] calcitRIOL  1 mcg Oral Once per day on Sun Sat  . famotidine  20 mg Oral Daily  . feeding supplement (PRO-STAT SUGAR FREE 64)  30 mL Oral BID  . heparin injection (subcutaneous)  7,500 Units Subcutaneous Q8H  . insulin aspart  0-15 Units Subcutaneous Q4H  . insulin detemir  0.075 Units/kg Subcutaneous BID  . Ipratropium-Albuterol  2 puff Inhalation Q6H  . labetalol  200 mg Oral BID  . methylPREDNISolone (SOLU-MEDROL) injection  0.5 mg/kg Intravenous Q12H  . multivitamin  1 tablet Oral QHS  . zinc sulfate  220 mg Oral Daily   Continuous Infusions: . cefTRIAXone (ROCEPHIN)  IV Stopped (03/26/19 2312)  . remdesivir 100 mg in NS 100 mL     PRN Meds:.acetaminophen **OR** acetaminophen, ALPRAZolam, chlorpheniramine-HYDROcodone, guaiFENesin-dextromethorphan, prochlorperazine  CBC Recent Labs  Lab  03/25/19 1835  WBC 7.5  NEUTROABS 6.1  HGB 11.3*  HCT 36.2  MCV 71.3*  PLT 22761 Basic Metabolic Panel Recent Labs  Lab 03/25/19 1835  NA 127*  K 3.0*  CL 86*  CO2 26  GLUCOSE 142*  BUN 44*  CREATININE 9.53*  CALCIUM 7.5*    Physical Exam  Blood pressure 134/68, pulse 76, temperature 97.9 F (36.6 C), temperature source Oral, resp. rate 15, height '5\' 8"'  (1.727 m), weight 113.3 kg, SpO2 97 %. GEN: NAD, ENT: NCAT EYES: EOMI CV: RRR PULM: Coars bs b/ ABD: s/nt/nd SKIN: no rashes/lesions; PD exit not examined EXT:no LEE  Assessment 60M ESRD on PD with COVID19 infection, mainly GI symptoms  1. ESRD on PD, CCPD  2.5L x4 and ico LF 2. COVID 19 infection, per TRH, Remdesivir and Steroids, mild O2 req 3. DM2 4. HTN 5. Anemia of CKD, Hb 11.3 6. CKD-BMD: no recent labs, on C3 at home  Plan 1. CCPD 2.5L x4 tonight, all 1.5%. Given poor PO with N/V/D can probably hold ico for now and see how she does 2. Check RFP now   Rexene Agent  [phone] pgr 03/27/2019, 4:17 PM

## 2019-03-27 NOTE — Progress Notes (Signed)
   03/27/19 2010  Vitals  Pulse Rate 77  ECG Heart Rate 77  Resp (!) 25  Oxygen Therapy  SpO2 (!) 81 %  O2 Device Room Air  Patient Activity (if Appropriate) Other (Comment) (Patient getting up to [facility].)  MEWS Score  MEWS RR 1  MEWS Pulse 0  MEWS Systolic 0  MEWS LOC 0  MEWS Temp 0  MEWS Score 1  MEWS Score Color Green   SpO2 returned to 92% on RA within 10 minutes. Will continue to monitor.

## 2019-03-27 NOTE — Progress Notes (Signed)
Initial Nutrition Assessment  RD working remotely.  DOCUMENTATION CODES:   Obesity unspecified  INTERVENTION:   - Pro-stat 30 ml po BID, each supplement provides 100 kcal and 15 grams of protein  - Magic cup BID with lunch and dinner meals, each supplement provides 290 kcal and 9 grams of protein  - Renal MVI daily  - d/c Ensure Enlive due to pt refusal  NUTRITION DIAGNOSIS:   Inadequate oral intake related to nausea, vomiting, diarrhea as evidenced by per patient/family report.  GOAL:   Patient will meet greater than or equal to 90% of their needs  MONITOR:   PO intake, Supplement acceptance, Diet advancement, Labs, Weight trends, I & O's  REASON FOR ASSESSMENT:   Malnutrition Screening Tool    ASSESSMENT:   53 year old female who presented to the ED on 12/28 with N/V/D x 1 month. PMH of ESRD on PD, CHF, HTN, T2DM, chronic diarrhea s/p cholecystectomy 1 year ago. Pt found to be COVID-19 positive and found to have COVID-19 pneumonia.   Pt on a full liquid diet at this time. Meal completion 100% x 1 meal.  Spoke with pt via phone call to room. Pt reports that she has barely eaten over the last 2 weeks due to N/V/D and has only had soup and jello. Pt reports that her husband was encouraging her to eat but that she couldn't eat any solid food.  Pt reports she was able to have soup and jello yesterday and then again today. Pt denies any N/V but endorses having diarrhea.  Pt reports that she does not drink any oral nutrition supplements like Ensure or Nepro. Pt is willing to try Magic Cups and Pro-stat. Pt also willing to take a renal MVI during admission. Pt states that her MD wants her to be taking a renal MVI at home.  Pt reports that her EDW is 114 kg. Pt states that she last weighed herself on Sunday and weighed 103 kg.  Reviewed weight history in chart. Pt with an 8.2 kg weight loss since 07/11/18. This is a 6.7% weight loss which is not significant for timeframe. Per  RN edema assessment, pt with non-pitting edema to BUE which may be masking additional weight loss.  Medications reviewed and include: vitamin C, calcitriol, Pepcid, Ensure Enlive BID, SSI q 4 hours, Levemir 8 units BID, solu-medrol, zinc sulfate, IV abx, remdesivir  Labs reviewed: sodium 127, potassium 3.0, chloride 86 CBG's: 120-249 x 24 hours  NUTRITION - FOCUSED PHYSICAL EXAM:  Unable to complete at this time. RD working remotely.  Diet Order:   Diet Order            Diet full liquid Room service appropriate? Yes; Fluid consistency: Thin  Diet effective now              EDUCATION NEEDS:   Education needs have been addressed  Skin:  Skin Assessment: Reviewed RN Assessment  Last BM:  03/27/19  Height:   Ht Readings from Last 1 Encounters:  03/25/19 5\' 8"  (1.727 m)    Weight:   Wt Readings from Last 1 Encounters:  03/25/19 113.3 kg    Ideal Body Weight:  63.6 kg  BMI:  Body mass index is 37.98 kg/m.  Estimated Nutritional Needs:   Kcal:  2200-2400  Protein:  110-130 grams  Fluid:  UOP + 1000 ml    Janet Face, MS, RD, LDN Inpatient Clinical Dietitian Pager: [phone] Weekend/After Hours: [phone]

## 2019-03-27 NOTE — Progress Notes (Signed)
Patient arrived to unit from [facility] via [place]. Alert and oriented x4, was able to walk to the bed with stand-by assist. Will use BSC. Has right AC PIV access, flushed/saline locked. Currently on 2L nasal cannula. Connected to bedside monitor for telemetry/O2 monitoring. Patient very amicable! Oriented to unit routine, call system. Assessed and VSS.

## 2019-03-28 DIAGNOSIS — Z992 Dependence on renal dialysis: Secondary | ICD-10-CM | POA: Diagnosis not present

## 2019-03-28 DIAGNOSIS — N186 End stage renal disease: Secondary | ICD-10-CM | POA: Diagnosis not present

## 2019-03-28 LAB — COMPREHENSIVE METABOLIC PANEL
ALT: 9 U/L (ref 0–44)
AST: 17 U/L (ref 15–41)
Albumin: 1.7 g/dL — ABNORMAL LOW (ref 3.5–5.0)
Alkaline Phosphatase: 103 U/L (ref 38–126)
Anion gap: 21 — ABNORMAL HIGH (ref 5–15)
BUN: 69 mg/dL — ABNORMAL HIGH (ref 6–20)
CO2: 18 mmol/L — ABNORMAL LOW (ref 22–32)
Calcium: 7.6 mg/dL — ABNORMAL LOW (ref 8.9–10.3)
Chloride: 87 mmol/L — ABNORMAL LOW (ref 98–111)
Creatinine, Ser: 11.15 mg/dL — ABNORMAL HIGH (ref 0.44–1.00)
GFR calc Af Amer: 4 mL/min — ABNORMAL LOW (ref >60)
GFR calc non Af Amer: 4 mL/min — ABNORMAL LOW (ref >60)
Glucose, Bld: 314 mg/dL — ABNORMAL HIGH (ref 70–99)
Potassium: 3.4 mmol/L — ABNORMAL LOW (ref 3.5–5.1)
Sodium: 126 mmol/L — ABNORMAL LOW (ref 135–145)
Total Bilirubin: 0.7 mg/dL (ref 0.3–1.2)
Total Protein: 6.9 g/dL (ref 6.5–8.1)

## 2019-03-28 LAB — CBC WITH DIFFERENTIAL/PLATELET
Abs Immature Granulocytes: 0.1 10*3/uL — ABNORMAL HIGH (ref 0.00–0.07)
Basophils Absolute: 0 10*3/uL (ref 0.0–0.1)
Basophils Relative: 0 %
Eosinophils Absolute: 0 10*3/uL (ref 0.0–0.5)
Eosinophils Relative: 0 %
HCT: 35.1 % — ABNORMAL LOW (ref 36.0–46.0)
Hemoglobin: 11.6 g/dL — ABNORMAL LOW (ref 12.0–15.0)
Immature Granulocytes: 1 %
Lymphocytes Relative: 6 %
Lymphs Abs: 0.7 10*3/uL (ref 0.7–4.0)
MCH: 22.5 pg — ABNORMAL LOW (ref 26.0–34.0)
MCHC: 33 g/dL (ref 30.0–36.0)
MCV: 68 fL — ABNORMAL LOW (ref 80.0–100.0)
Monocytes Absolute: 0.2 10*3/uL (ref 0.1–1.0)
Monocytes Relative: 2 %
Neutro Abs: 10 10*3/uL — ABNORMAL HIGH (ref 1.7–7.7)
Neutrophils Relative %: 91 %
Platelets: 301 10*3/uL (ref 150–400)
RBC: 5.16 MIL/uL — ABNORMAL HIGH (ref 3.87–5.11)
RDW: 20.7 % — ABNORMAL HIGH (ref 11.5–15.5)
WBC: 11 10*3/uL — ABNORMAL HIGH (ref 4.0–10.5)
nRBC: 0 % (ref 0.0–0.2)

## 2019-03-28 LAB — D-DIMER, QUANTITATIVE: D-Dimer, Quant: 10.96 ug/mL-FEU — ABNORMAL HIGH (ref 0.00–0.50)

## 2019-03-28 LAB — HEPATITIS B SURFACE ANTIBODY,QUALITATIVE: Hep B S Ab: REACTIVE — AB

## 2019-03-28 LAB — HEPATITIS B CORE ANTIBODY, TOTAL: Hep B Core Total Ab: NONREACTIVE

## 2019-03-28 LAB — MAGNESIUM: Magnesium: 1.6 mg/dL — ABNORMAL LOW (ref 1.7–2.4)

## 2019-03-28 LAB — HEPATITIS B SURFACE ANTIGEN: Hepatitis B Surface Ag: NONREACTIVE

## 2019-03-28 LAB — FERRITIN: Ferritin: 1608 ng/mL — ABNORMAL HIGH (ref 11–307)

## 2019-03-28 LAB — GLUCOSE, CAPILLARY
Glucose-Capillary: 357 mg/dL — ABNORMAL HIGH (ref 70–99)
Glucose-Capillary: 153 mg/dL — ABNORMAL HIGH (ref 70–99)
Glucose-Capillary: 117 mg/dL — ABNORMAL HIGH (ref 70–99)
Glucose-Capillary: 251 mg/dL — ABNORMAL HIGH (ref 70–99)

## 2019-03-28 LAB — C-REACTIVE PROTEIN: CRP: 16.5 mg/dL — ABNORMAL HIGH (ref <1.0)

## 2019-03-28 LAB — PHOSPHORUS: Phosphorus: 6.8 mg/dL — ABNORMAL HIGH (ref 2.5–4.6)

## 2019-03-28 MED ORDER — LOPERAMIDE HCL 2 MG PO CAPS
2.0000 mg | ORAL_CAPSULE | ORAL | Status: DC | PRN
  Administered 2019-03-28: 2 mg via ORAL
  Filled 2019-03-28: qty 1

## 2019-03-28 MED ORDER — HEPARIN SODIUM (PORCINE) 5000 UNIT/ML IJ SOLN
INTRAMUSCULAR | Status: AC
  Filled 2019-03-28: qty 2

## 2019-03-28 NOTE — Progress Notes (Signed)
  [facility] Progress Note    Assessment/ Plan:   1. ESRD on PD, CCPD 2.5L x4 and ico LF--> have done just the day exchanges and no last fill here, will continue since appetite not that good. 2. COVID 19 infection, per TRH, Remdesivir and Steroids, off O2 now 3. DM2 4. HTN 5. Anemia of CKD, Hb 11.3 6. CKD-BMD: no recent labs, on C3 at home  Subjective:    Seen in room.  PD went OK.  Feeling better.     Objective:   BP 123/74   Pulse 75   Temp 97.8 F (36.6 C) (Oral)   Resp 14   Ht 5\' 8"  (1.727 m)   Wt 103.9 kg   SpO2 94%   BMI 34.83 kg/m   Intake/Output Summary (Last 24 hours) at 03/28/2019 1043 Last data filed at 03/28/2019 0700 Gross per 24 hour  Intake 608.51 ml  Output --  Net 608.51 ml   Weight change:   Physical Exam: Gen: NAD, sitting in chair CVS: RRR Resp: clear Abd: hyperactive bowel sounds, nontender  Ext: no LE edema  Imaging: No results found.  Labs: BMET Recent Labs  Lab 03/25/19 1835 03/27/19 1723 03/28/19 0500  NA 127* 129* 126*  K 3.0* 3.7 3.4*  CL 86* 89* 87*  CO2 26 19* 18*  GLUCOSE 142* 100* 314*  BUN 44* 69* 69*  CREATININE 9.53* 11.88* 11.15*  CALCIUM 7.5* 7.8* 7.6*  PHOS  --  7.0* 6.8*   CBC Recent Labs  Lab 03/25/19 1835 03/28/19 0500  WBC 7.5 11.0*  NEUTROABS 6.1 10.0*  HGB 11.3* 11.6*  HCT 36.2 35.1*  MCV 71.3* 68.0*  PLT 229 301    Medications:    . amLODipine  10 mg Oral Daily  . vitamin C  500 mg Oral Daily  . calcitRIOL  0.5 mcg Oral Once per day on Mon Tue Wed Thu Fri  . [START ON 03/30/2019] calcitRIOL  1 mcg Oral Once per day on Sun Sat  . famotidine  20 mg Oral Daily  . feeding supplement (PRO-STAT SUGAR FREE 64)  30 mL Oral BID  . gentamicin cream  1 application Topical Daily  . heparin injection (subcutaneous)  7,500 Units Subcutaneous Q8H  . insulin aspart  0-15 Units Subcutaneous TID WC  . insulin detemir  0.075 Units/kg Subcutaneous BID  . Ipratropium-Albuterol  2 puff  Inhalation Q6H  . labetalol  200 mg Oral BID  . methylPREDNISolone (SOLU-MEDROL) injection  0.5 mg/kg Intravenous Q12H  . multivitamin  1 tablet Oral QHS  . zinc sulfate  220 mg Oral Daily      Madelon Lips, MD [facility] pgr [phone] 03/28/2019, 10:43 AM

## 2019-03-28 NOTE — Progress Notes (Signed)
[facility]  PROGRESS NOTE  Janet Mitchell MAU:[id number] DOB: 1965/11/02 DOA: 03/25/2019 PCP: Lucianne Lei, MD   Brief HPI:   54 year old female with history of anemia due to ESRD, history of blood transfusion, CHF, chronic cholecystitis, ESRD on peritoneal dialysis, history of hypertension, spinal headaches, type 2 diabetes mellitus came to ED with complaints of nausea vomiting and diarrhea for about a month.  Patient states that she started having loose stools post cholecystectomy.  She was prescribed Levsin by her gastroenterologist but has not yet started the medication.  She has been using Imodium without significant relief.  Denies abdominal pain, melena or hematochezia.  Denies fever or chills.    Subjective   Patient seen and examined, she has been weaned off oxygen.  O2 sats 94% on room air   Assessment/Plan:     1. Acute respiratory failure with hypoxia due to SARS COVID-19 pneumonia-patient O2 sats are 97% on 2 L/min of oxygen, patient started on remdesivir, Solu-Medrol.  Patient has elevated procalcitonin 1.70, also started on ceftriaxone empirically for superimposed pneumonia.  We will continue with ceftriaxone.  Repeat CRP is 16.5 this morning, improved from 27 yesterday.  Ferritin 04/02/2006.  Procalcitonin still elevated 1.80.  2. End-stage renal disease on peritoneal dialysis-continue peritoneal dialysis, nephrology following.  3. Type 2 diabetes mellitus with nephropathy-continue sliding scale insulin with NovoLog, Levemir.  4. Nausea, vomiting/dehydration-resolved, patient was started on gentle IV hydration.  Will avoid further hydration as patient has ESRD on PD.  Follow nephrology recommendations.  Nausea and vomiting has resolved.  5. Anemia of chronic disease-patient has any medical ESRD, IV iron and Epogen therapy as per nephrology discretion.  6. Hypertension-blood pressure stable, continue manidipine, labetalol.  7. GERD-continue Pepcid    COVID-19  Labs  Recent Labs    03/25/19 1835 03/26/19 0146 03/28/19 0500  DDIMER  --  2.97* 10.96*  FERRITIN 1,474*  --  1,608*  LDH  --  248*  --   CRP 27.9*  --  16.5*    Lab Results  Component Value Date   SARSCOV2NAA POSITIVE (A) 03/25/2019   [place] Not Detected 03/05/2019   [place] NEGATIVE 12/18/2018   [place] NEGATIVE 10/19/2018     CBG: Recent Labs  Lab 03/27/19 1212 03/27/19 1616 03/27/19 1946 03/27/19 2137 03/28/19 0759  GLUCAP 155* 84 140* 274* 357*    CBC: Recent Labs  Lab 03/25/19 1835 03/28/19 0500  WBC 7.5 11.0*  NEUTROABS 6.1 10.0*  HGB 11.3* 11.6*  HCT 36.2 35.1*  MCV 71.3* 68.0*  PLT 229 563    Basic Metabolic Panel: Recent Labs  Lab 03/25/19 1835 03/27/19 1723 03/28/19 0500  NA 127* 129* 126*  K 3.0* 3.7 3.4*  CL 86* 89* 87*  CO2 26 19* 18*  GLUCOSE 142* 100* 314*  BUN 44* 69* 69*  CREATININE 9.53* 11.88* 11.15*  CALCIUM 7.5* 7.8* 7.6*  MG  --   --  1.6*  PHOS  --  7.0* 6.8*     Liver Function Tests: Recent Labs  Lab 03/25/19 1835 03/27/19 1723 03/28/19 0500  AST 17  --  17  ALT 6  --  9  ALKPHOS 102  --  103  BILITOT 0.7  --  0.7  PROT 7.1  --  6.9  ALBUMIN 1.8* 1.7* 1.7*        DVT prophylaxis: Heparin  Code Status: Full code  Family Communication: No family at bedside  Disposition Plan: likely home when medically ready for discharge  Scheduled medications:  . amLODipine  10 mg Oral Daily  . vitamin C  500 mg Oral Daily  . calcitRIOL  0.5 mcg Oral Once per day on Mon Tue Wed Thu Fri  . [START ON 03/30/2019] calcitRIOL  1 mcg Oral Once per day on Sun Sat  . famotidine  20 mg Oral Daily  . feeding supplement (PRO-STAT SUGAR FREE 64)  30 mL Oral BID  . gentamicin cream  1 application Topical Daily  . heparin injection (subcutaneous)  7,500 Units Subcutaneous Q8H  . insulin aspart  0-15 Units Subcutaneous TID WC  . insulin detemir  0.075 Units/kg Subcutaneous BID  . Ipratropium-Albuterol  2  puff Inhalation Q6H  . labetalol  200 mg Oral BID  . methylPREDNISolone (SOLU-MEDROL) injection  0.5 mg/kg Intravenous Q12H  . multivitamin  1 tablet Oral QHS  . zinc sulfate  220 mg Oral Daily      Antibiotics:   Anti-infectives (From admission, onward)   Start     Dose/Rate Route Frequency Ordered Stop   03/27/19 1000  remdesivir 100 mg in sodium chloride 0.9 % 100 mL IVPB     100 mg 200 mL/hr over 30 Minutes Intravenous Daily 03/26/19 0108 03/31/19 0959   03/26/19 2200  cefTRIAXone (ROCEPHIN) 2 g in sodium chloride 0.9 % 100 mL IVPB     2 g 200 mL/hr over 30 Minutes Intravenous Every 24 hours 03/26/19 0339     03/26/19 0115  remdesivir 200 mg in sodium chloride 0.9% 250 mL IVPB     200 mg 580 mL/hr over 30 Minutes Intravenous Once 03/26/19 0108 03/26/19 1005   03/25/19 2215  cefTRIAXone (ROCEPHIN) 1 g in sodium chloride 0.9 % 100 mL IVPB     1 g 200 mL/hr over 30 Minutes Intravenous  Once 03/25/19 2210 03/25/19 2333   03/25/19 2215  azithromycin (ZITHROMAX) 500 mg in sodium chloride 0.9 % 250 mL IVPB     500 mg 250 mL/hr over 60 Minutes Intravenous  Once 03/25/19 2210 03/26/19 0311       Objective   Vitals:   03/28/19 0645 03/28/19 0713 03/28/19 0715 03/28/19 0800  BP:  118/64 118/64 123/74  Pulse:  79 80 75  Resp:  (!) 21 17 14   Temp:  97.8 F (36.6 C)    TempSrc:  Oral    SpO2:  91% (!) 89% 94%  Weight: 103.9 kg     Height:        Intake/Output Summary (Last 24 hours) at 03/28/2019 1058 Last data filed at 03/28/2019 0700 Gross per 24 hour  Intake 608.51 ml  Output -  Net 608.51 ml    12/29 1901 - 12/31 0700 In: 708.5 [P.O.:480; I.V.:15.4] Out: -   Filed Weights   03/25/19 1726 03/27/19 2201 03/28/19 0645  Weight: 113.3 kg 103.9 kg 103.9 kg    Physical Examination:    General-appears in no acute distress  Heart-S1-S2, regular, no murmur auscultated  Lungs-clear to auscultation bilaterally, no wheezing or crackles auscultated  Abdomen-soft,  nontender, no organomegaly  Extremities-no edema in the lower extremities  Neuro-alert, oriented x3, no focal deficit noted  Thanks  Data Reviewed:   Recent Results (from the past 240 hour(s))  Respiratory Panel by RT PCR (Flu A&B, Covid) - Nasopharyngeal Swab     Status: Abnormal   Collection Time: 03/25/19 11:32 PM   Specimen: Nasopharyngeal Swab  Result Value Ref Range Status   SARS Coronavirus 2 by RT PCR POSITIVE (A) NEGATIVE  Final    Comment: RESULT CALLED TO, READ BACK BY AND VERIFIED WITH: EASTER,T. AT 0031 ON 03/26/2019 BY EVA (NOTE) SARS-CoV-2 target nucleic acids are DETECTED. SARS-CoV-2 RNA is generally detectable in upper respiratory specimens  during the acute phase of infection. Positive results are indicative of the presence of the identified virus, but do not rule out bacterial infection or co-infection with other pathogens not detected by the test. Clinical correlation with patient history and other diagnostic information is necessary to determine patient infection status. The expected result is Negative. Fact Sheet for Patients:  PinkCheek.be Fact Sheet for Healthcare Providers: GravelBags.it This test is not yet approved or cleared by the [place] FDA and  has been authorized for detection and/or diagnosis of SARS-CoV-2 by FDA under an Emergency Use Authorization (EUA).  This EUA will remain in effect (meaning this test can be used ) for the duration of  the COVID-19 declaration under Section 564(b)(1) of the Act, 21 U.S.C. section 360bbb-3(b)(1), unless the authorization is terminated or revoked sooner.    Influenza A by PCR NEGATIVE NEGATIVE Final   Influenza B by PCR NEGATIVE NEGATIVE Final    Comment: (NOTE) The Xpert Xpress SARS-CoV-2/FLU/RSV assay is intended as an aid in  the diagnosis of influenza from Nasopharyngeal swab specimens and  should not be used as a sole basis for  treatment. Nasal washings and  aspirates are unacceptable for Xpert Xpress SARS-CoV-2/FLU/RSV  testing. Fact Sheet for Patients: PinkCheek.be Fact Sheet for Healthcare Providers: GravelBags.it This test is not yet approved or cleared by the [place] FDA and  has been authorized for detection and/or diagnosis of SARS-CoV-2 by  FDA under an Emergency Use Authorization (EUA). This EUA will remain  in effect (meaning this test can be used) for the duration of the  Covid-19 declaration under Section 564(b)(1) of the Act, 21  U.S.C. section 360bbb-3(b)(1), unless the authorization is  terminated or revoked. Performed at [facility], [address] [place], [place] [address]       Admission status: Inpatient: Based on patients clinical presentation and evaluation of above clinical data, I have made determination that patient meets Inpatient criteria at this time.   Oswald Hillock   [facility] If 7PM-7AM, please contact night-coverage at [email or web address], Office  [phone]  password [place]  03/28/2019, 10:58 AM  LOS: 2 days

## 2019-03-29 DIAGNOSIS — U071 COVID-19: Secondary | ICD-10-CM

## 2019-03-29 HISTORY — DX: COVID-19: U07.1

## 2019-03-29 LAB — GLUCOSE, CAPILLARY
Glucose-Capillary: 419 mg/dL — ABNORMAL HIGH (ref 70–99)
Glucose-Capillary: 254 mg/dL — ABNORMAL HIGH (ref 70–99)
Glucose-Capillary: 230 mg/dL — ABNORMAL HIGH (ref 70–99)
Glucose-Capillary: 294 mg/dL — ABNORMAL HIGH (ref 70–99)

## 2019-03-29 LAB — COMPREHENSIVE METABOLIC PANEL
ALT: 10 U/L (ref 0–44)
AST: 20 U/L (ref 15–41)
Albumin: 1.8 g/dL — ABNORMAL LOW (ref 3.5–5.0)
Alkaline Phosphatase: 94 U/L (ref 38–126)
Anion gap: 20 — ABNORMAL HIGH (ref 5–15)
BUN: 74 mg/dL — ABNORMAL HIGH (ref 6–20)
CO2: 19 mmol/L — ABNORMAL LOW (ref 22–32)
Calcium: 7.6 mg/dL — ABNORMAL LOW (ref 8.9–10.3)
Chloride: 86 mmol/L — ABNORMAL LOW (ref 98–111)
Creatinine, Ser: 10.53 mg/dL — ABNORMAL HIGH (ref 0.44–1.00)
GFR calc Af Amer: 4 mL/min — ABNORMAL LOW (ref >60)
GFR calc non Af Amer: 4 mL/min — ABNORMAL LOW (ref >60)
Glucose, Bld: 441 mg/dL — ABNORMAL HIGH (ref 70–99)
Potassium: 3.7 mmol/L (ref 3.5–5.1)
Sodium: 125 mmol/L — ABNORMAL LOW (ref 135–145)
Total Bilirubin: 0.8 mg/dL (ref 0.3–1.2)
Total Protein: 6.9 g/dL (ref 6.5–8.1)

## 2019-03-29 LAB — CBC WITH DIFFERENTIAL/PLATELET
Abs Immature Granulocytes: 0.09 10*3/uL — ABNORMAL HIGH (ref 0.00–0.07)
Basophils Absolute: 0 10*3/uL (ref 0.0–0.1)
Basophils Relative: 0 %
Eosinophils Absolute: 0 10*3/uL (ref 0.0–0.5)
Eosinophils Relative: 0 %
HCT: 37.1 % (ref 36.0–46.0)
Hemoglobin: 12.1 g/dL (ref 12.0–15.0)
Immature Granulocytes: 1 %
Lymphocytes Relative: 7 %
Lymphs Abs: 0.6 10*3/uL — ABNORMAL LOW (ref 0.7–4.0)
MCH: 22.4 pg — ABNORMAL LOW (ref 26.0–34.0)
MCHC: 32.6 g/dL (ref 30.0–36.0)
MCV: 68.7 fL — ABNORMAL LOW (ref 80.0–100.0)
Monocytes Absolute: 0.3 10*3/uL (ref 0.1–1.0)
Monocytes Relative: 4 %
Neutro Abs: 7.5 10*3/uL (ref 1.7–7.7)
Neutrophils Relative %: 88 %
Platelets: 253 10*3/uL (ref 150–400)
RBC: 5.4 MIL/uL — ABNORMAL HIGH (ref 3.87–5.11)
RDW: 21 % — ABNORMAL HIGH (ref 11.5–15.5)
WBC: 8.5 10*3/uL (ref 4.0–10.5)
nRBC: 0 % (ref 0.0–0.2)

## 2019-03-29 LAB — D-DIMER, QUANTITATIVE: D-Dimer, Quant: 2.77 ug/mL-FEU — ABNORMAL HIGH (ref 0.00–0.50)

## 2019-03-29 LAB — C-REACTIVE PROTEIN: CRP: 11 mg/dL — ABNORMAL HIGH (ref <1.0)

## 2019-03-29 LAB — FERRITIN: Ferritin: 1623 ng/mL — ABNORMAL HIGH (ref 11–307)

## 2019-03-29 LAB — MAGNESIUM: Magnesium: 1.5 mg/dL — ABNORMAL LOW (ref 1.7–2.4)

## 2019-03-29 MED ORDER — MAGNESIUM SULFATE 2 GM/50ML IV SOLN
2.0000 g | Freq: Once | INTRAVENOUS | Status: AC
  Administered 2019-03-29: 14:00:00 2 g via INTRAVENOUS
  Filled 2019-03-29: qty 50

## 2019-03-29 MED ORDER — INSULIN DETEMIR 100 UNIT/ML ~~LOC~~ SOLN
10.0000 [IU] | Freq: Two times a day (BID) | SUBCUTANEOUS | Status: DC
  Administered 2019-03-29 – 2019-03-30 (×2): 10 [IU] via SUBCUTANEOUS
  Filled 2019-03-29 (×4): qty 0.1

## 2019-03-29 NOTE — Progress Notes (Signed)
  [facility] Progress Note    Assessment/ Plan:   1. ESRD on PD, CCPD 2.5L x4 and ico LF--> have done just the day exchanges and no last fill here, will continue since appetite not that good.  Do all 2.5% tonight-- that's what she uses at home. 2. COVID 19 infection, per TRH, Remdesivir and Steroids, off O2 now 3. DM2 4. HTN 5. Anemia of CKD, Hb 11.3 6. CKD-BMD: no recent labs, on C3 at home 7. Dispo: home tomorrow if everything looking Ok  Subjective:    Na down a little.  Overall feeling OK. Plan for d/c tomorrow after 5 day dose of remdesivir given.   Objective:   BP (!) 123/57 (BP Location: Right Arm)   Pulse 76   Temp 98.6 F (37 C) (Oral)   Resp 20   Ht 5\' 8"  (1.727 m)   Wt 101.8 kg   SpO2 97%   BMI 34.12 kg/m   Intake/Output Summary (Last 24 hours) at 03/29/2019 1758 Last data filed at 03/29/2019 0000 Gross per 24 hour  Intake 200 ml  Output --  Net 200 ml   Weight change: -3.1 kg  Physical Exam: Gen: NAD, sitting in chair CVS: RRR Resp: clear Abd: hyperactive bowel sounds, nontender  Ext: no LE edema  Imaging: No results found.  Labs: BMET Recent Labs  Lab 03/25/19 1835 03/27/19 1723 03/28/19 0500 03/29/19 0231  NA 127* 129* 126* 125*  K 3.0* 3.7 3.4* 3.7  CL 86* 89* 87* 86*  CO2 26 19* 18* 19*  GLUCOSE 142* 100* 314* 441*  BUN 44* 69* 69* 74*  CREATININE 9.53* 11.88* 11.15* 10.53*  CALCIUM 7.5* 7.8* 7.6* 7.6*  PHOS  --  7.0* 6.8*  --    CBC Recent Labs  Lab 03/25/19 1835 03/28/19 0500 03/29/19 0231  WBC 7.5 11.0* 8.5  NEUTROABS 6.1 10.0* 7.5  HGB 11.3* 11.6* 12.1  HCT 36.2 35.1* 37.1  MCV 71.3* 68.0* 68.7*  PLT 229 301 253    Medications:    . amLODipine  10 mg Oral Daily  . vitamin C  500 mg Oral Daily  . calcitRIOL  0.5 mcg Oral Once per day on Mon Tue Wed Thu Fri  . [START ON 03/30/2019] calcitRIOL  1 mcg Oral Once per day on Sun Sat  . famotidine  20 mg Oral Daily  . feeding supplement (PRO-STAT SUGAR FREE  64)  30 mL Oral BID  . gentamicin cream  1 application Topical Daily  . heparin injection (subcutaneous)  7,500 Units Subcutaneous Q8H  . insulin aspart  0-15 Units Subcutaneous TID WC  . insulin detemir  10 Units Subcutaneous BID  . Ipratropium-Albuterol  2 puff Inhalation Q6H  . labetalol  200 mg Oral BID  . methylPREDNISolone (SOLU-MEDROL) injection  0.5 mg/kg Intravenous Q12H  . multivitamin  1 tablet Oral QHS  . zinc sulfate  220 mg Oral Daily      Madelon Lips, MD [facility] pgr [phone] 03/29/2019, 5:58 PM

## 2019-03-29 NOTE — Progress Notes (Signed)
[facility]  PROGRESS NOTE  Janet Mitchell ZOX:[id number] DOB: 1966-01-10 DOA: 03/25/2019 PCP: Lucianne Lei, MD   Brief HPI:   54 year old female with history of anemia due to ESRD, history of blood transfusion, CHF, chronic cholecystitis, ESRD on peritoneal dialysis, history of hypertension, spinal headaches, type 2 diabetes mellitus came to ED with complaints of nausea vomiting and diarrhea for about a month.  Patient states that she started having loose stools post cholecystectomy.  She was prescribed Levsin by her gastroenterologist but has not yet started the medication.  She has been using Imodium without significant relief.  Denies abdominal pain, melena or hematochezia.  Denies fever or chills.    Subjective   Patient seen and examined, denies chest pain or shortness of breath.   Assessment/Plan:     1. Acute respiratory failure with hypoxia due to SARS COVID-19 pneumonia-patient O2 sats are 95% on room air, patient started on remdesivir, Solu-Medrol.  Patient has elevated procalcitonin 1.70, also started on ceftriaxone empirically for superimposed pneumonia.  We will continue with ceftriaxone.  Repeat CRP is 11.0 this morning, improved from 16.5 yesterday.  Ferritin 04/02/2006.  Procalcitonin still elevated 1.80.  2. End-stage renal disease on peritoneal dialysis-continue peritoneal dialysis, nephrology following.  3. Type 2 diabetes mellitus with nephropathy-continue sliding scale insulin with NovoLog, Levemir.  4. Nausea, vomiting/dehydration-resolved, patient was started on gentle IV hydration.  Will avoid further hydration as patient has ESRD on PD.  Follow nephrology recommendations.  Nausea and vomiting has resolved.  5. Anemia of chronic disease-patient has any medical ESRD, IV iron and Epogen therapy as per nephrology discretion.  6. Hypertension-blood pressure stable, continue manidipine, labetalol.  7. GERD-continue Pepcid     COVID-19 Labs  Recent Labs     03/28/19 0500 03/29/19 0231  DDIMER 10.96* 2.77*  FERRITIN 1,608* 1,623*  CRP 16.5* 11.0*    Lab Results  Component Value Date   SARSCOV2NAA POSITIVE (A) 03/25/2019   [place] Not Detected 03/05/2019   [place] NEGATIVE 12/18/2018   [place] NEGATIVE 10/19/2018     CBG: Recent Labs  Lab 03/28/19 1159 03/28/19 1713 03/28/19 2031 03/29/19 0750 03/29/19 1242  GLUCAP 153* 117* 251* 419* 254*    CBC: Recent Labs  Lab 03/25/19 1835 03/28/19 0500 03/29/19 0231  WBC 7.5 11.0* 8.5  NEUTROABS 6.1 10.0* 7.5  HGB 11.3* 11.6* 12.1  HCT 36.2 35.1* 37.1  MCV 71.3* 68.0* 68.7*  PLT 229 301 811    Basic Metabolic Panel: Recent Labs  Lab 03/25/19 1835 03/27/19 1723 03/28/19 0500 03/29/19 0231  NA 127* 129* 126* 125*  K 3.0* 3.7 3.4* 3.7  CL 86* 89* 87* 86*  CO2 26 19* 18* 19*  GLUCOSE 142* 100* 314* 441*  BUN 44* 69* 69* 74*  CREATININE 9.53* 11.88* 11.15* 10.53*  CALCIUM 7.5* 7.8* 7.6* 7.6*  MG  --   --  1.6* 1.5*  PHOS  --  7.0* 6.8*  --      Liver Function Tests: Recent Labs  Lab 03/25/19 1835 03/27/19 1723 03/28/19 0500 03/29/19 0231  AST 17  --  17 20  ALT 6  --  9 10  ALKPHOS 102  --  103 94  BILITOT 0.7  --  0.7 0.8  PROT 7.1  --  6.9 6.9  ALBUMIN 1.8* 1.7* 1.7* 1.8*        DVT prophylaxis: Heparin  Code Status: Full code  Family Communication: No family at bedside  Disposition Plan: likely discharge home in a.m.  after completing 5 days of remdesivir.     Scheduled medications:  . amLODipine  10 mg Oral Daily  . vitamin C  500 mg Oral Daily  . calcitRIOL  0.5 mcg Oral Once per day on Mon Tue Wed Thu Fri  . [START ON 03/30/2019] calcitRIOL  1 mcg Oral Once per day on Sun Sat  . famotidine  20 mg Oral Daily  . feeding supplement (PRO-STAT SUGAR FREE 64)  30 mL Oral BID  . gentamicin cream  1 application Topical Daily  . heparin injection (subcutaneous)  7,500 Units Subcutaneous Q8H  . insulin aspart  0-15 Units  Subcutaneous TID WC  . insulin detemir  0.075 Units/kg Subcutaneous BID  . Ipratropium-Albuterol  2 puff Inhalation Q6H  . labetalol  200 mg Oral BID  . methylPREDNISolone (SOLU-MEDROL) injection  0.5 mg/kg Intravenous Q12H  . multivitamin  1 tablet Oral QHS  . zinc sulfate  220 mg Oral Daily      Antibiotics:   Anti-infectives (From admission, onward)   Start     Dose/Rate Route Frequency Ordered Stop   03/27/19 1000  remdesivir 100 mg in sodium chloride 0.9 % 100 mL IVPB     100 mg 200 mL/hr over 30 Minutes Intravenous Daily 03/26/19 0108 03/31/19 0959   03/26/19 2200  cefTRIAXone (ROCEPHIN) 2 g in sodium chloride 0.9 % 100 mL IVPB     2 g 200 mL/hr over 30 Minutes Intravenous Every 24 hours 03/26/19 0339     03/26/19 0115  remdesivir 200 mg in sodium chloride 0.9% 250 mL IVPB     200 mg 580 mL/hr over 30 Minutes Intravenous Once 03/26/19 0108 03/26/19 1005   03/25/19 2215  cefTRIAXone (ROCEPHIN) 1 g in sodium chloride 0.9 % 100 mL IVPB     1 g 200 mL/hr over 30 Minutes Intravenous  Once 03/25/19 2210 03/25/19 2333   03/25/19 2215  azithromycin (ZITHROMAX) 500 mg in sodium chloride 0.9 % 250 mL IVPB     500 mg 250 mL/hr over 60 Minutes Intravenous  Once 03/25/19 2210 03/26/19 0311       Objective   Vitals:   03/29/19 0000 03/29/19 0200 03/29/19 0400 03/29/19 0748  BP:  136/73 133/77 (!) 154/79  Pulse: 73 75 78 77  Resp: 15  14 16   Temp: 97.9 F (36.6 C)  98.7 F (37.1 C) 98.3 F (36.8 C)  TempSrc: Oral  Oral Oral  SpO2: 96% 91% 95% 96%  Weight:   102 kg 101.8 kg  Height:        Intake/Output Summary (Last 24 hours) at 03/29/2019 1406 Last data filed at 03/29/2019 0000 Gross per 24 hour  Intake 200 ml  Output --  Net 200 ml    12/30 1901 - 01/01 0700 In: 918.5 [P.O.:590; I.V.:15.4] Out: -   Filed Weights   03/28/19 2000 03/29/19 0400 03/29/19 0748  Weight: 100.8 kg 102 kg 101.8 kg    Physical Examination:    General-appears in no acute  distress  Heart-S1-S2, regular, no murmur auscultated  Lungs-clear to auscultation bilaterally, no wheezing or crackles auscultated  Abdomen-soft, nontender, no organomegaly  Extremities-no edema in the lower extremities  Neuro-alert, oriented x3, no focal deficit noted  Thanks  Data Reviewed:   Recent Results (from the past 240 hour(s))  Respiratory Panel by RT PCR (Flu A&B, Covid) - Nasopharyngeal Swab     Status: Abnormal   Collection Time: 03/25/19 11:32 PM   Specimen: Nasopharyngeal Swab  Result Value Ref Range Status   SARS Coronavirus 2 by RT PCR POSITIVE (A) NEGATIVE Final    Comment: RESULT CALLED TO, READ BACK BY AND VERIFIED WITH: EASTER,T. AT 0031 ON 03/26/2019 BY EVA (NOTE) SARS-CoV-2 target nucleic acids are DETECTED. SARS-CoV-2 RNA is generally detectable in upper respiratory specimens  during the acute phase of infection. Positive results are indicative of the presence of the identified virus, but do not rule out bacterial infection or co-infection with other pathogens not detected by the test. Clinical correlation with patient history and other diagnostic information is necessary to determine patient infection status. The expected result is Negative. Fact Sheet for Patients:  PinkCheek.be Fact Sheet for Healthcare Providers: GravelBags.it This test is not yet approved or cleared by the [place] FDA and  has been authorized for detection and/or diagnosis of SARS-CoV-2 by FDA under an Emergency Use Authorization (EUA).  This EUA will remain in effect (meaning this test can be used ) for the duration of  the COVID-19 declaration under Section 564(b)(1) of the Act, 21 U.S.C. section 360bbb-3(b)(1), unless the authorization is terminated or revoked sooner.    Influenza A by PCR NEGATIVE NEGATIVE Final   Influenza B by PCR NEGATIVE NEGATIVE Final    Comment: (NOTE) The Xpert Xpress  SARS-CoV-2/FLU/RSV assay is intended as an aid in  the diagnosis of influenza from Nasopharyngeal swab specimens and  should not be used as a sole basis for treatment. Nasal washings and  aspirates are unacceptable for Xpert Xpress SARS-CoV-2/FLU/RSV  testing. Fact Sheet for Patients: PinkCheek.be Fact Sheet for Healthcare Providers: GravelBags.it This test is not yet approved or cleared by the [place] FDA and  has been authorized for detection and/or diagnosis of SARS-CoV-2 by  FDA under an Emergency Use Authorization (EUA). This EUA will remain  in effect (meaning this test can be used) for the duration of the  Covid-19 declaration under Section 564(b)(1) of the Act, 21  U.S.C. section 360bbb-3(b)(1), unless the authorization is  terminated or revoked. Performed at [facility], [address] [place], [place] [address]       Admission status: Inpatient: Based on patients clinical presentation and evaluation of above clinical data, I have made determination that patient meets Inpatient criteria at this time.   Oswald Hillock   [facility] If 7PM-7AM, please contact night-coverage at [email or web address], Office  [phone]  password [email or web address]  03/29/2019, 2:06 PM  LOS: 3 days

## 2019-03-30 DIAGNOSIS — U071 COVID-19: Principal | ICD-10-CM

## 2019-03-30 LAB — COMPREHENSIVE METABOLIC PANEL
ALT: 9 U/L (ref 0–44)
AST: 13 U/L — ABNORMAL LOW (ref 15–41)
Albumin: 1.8 g/dL — ABNORMAL LOW (ref 3.5–5.0)
Alkaline Phosphatase: 92 U/L (ref 38–126)
Anion gap: 22 — ABNORMAL HIGH (ref 5–15)
BUN: 79 mg/dL — ABNORMAL HIGH (ref 6–20)
CO2: 19 mmol/L — ABNORMAL LOW (ref 22–32)
Calcium: 7.7 mg/dL — ABNORMAL LOW (ref 8.9–10.3)
Chloride: 84 mmol/L — ABNORMAL LOW (ref 98–111)
Creatinine, Ser: 10.08 mg/dL — ABNORMAL HIGH (ref 0.44–1.00)
GFR calc Af Amer: 5 mL/min — ABNORMAL LOW (ref >60)
GFR calc non Af Amer: 4 mL/min — ABNORMAL LOW (ref >60)
Glucose, Bld: 500 mg/dL — ABNORMAL HIGH (ref 70–99)
Potassium: 3.4 mmol/L — ABNORMAL LOW (ref 3.5–5.1)
Sodium: 125 mmol/L — ABNORMAL LOW (ref 135–145)
Total Bilirubin: 0.6 mg/dL (ref 0.3–1.2)
Total Protein: 6.4 g/dL — ABNORMAL LOW (ref 6.5–8.1)

## 2019-03-30 LAB — MAGNESIUM: Magnesium: 1.9 mg/dL (ref 1.7–2.4)

## 2019-03-30 LAB — BASIC METABOLIC PANEL
Anion gap: 22 — ABNORMAL HIGH (ref 5–15)
BUN: 90 mg/dL — ABNORMAL HIGH (ref 6–20)
CO2: 17 mmol/L — ABNORMAL LOW (ref 22–32)
Calcium: 7.8 mg/dL — ABNORMAL LOW (ref 8.9–10.3)
Chloride: 90 mmol/L — ABNORMAL LOW (ref 98–111)
Creatinine, Ser: 10.33 mg/dL — ABNORMAL HIGH (ref 0.44–1.00)
GFR calc Af Amer: 4 mL/min — ABNORMAL LOW (ref >60)
GFR calc non Af Amer: 4 mL/min — ABNORMAL LOW (ref >60)
Glucose, Bld: 218 mg/dL — ABNORMAL HIGH (ref 70–99)
Potassium: 3.6 mmol/L (ref 3.5–5.1)
Sodium: 129 mmol/L — ABNORMAL LOW (ref 135–145)

## 2019-03-30 LAB — CBC WITH DIFFERENTIAL/PLATELET
Abs Immature Granulocytes: 0.11 10*3/uL — ABNORMAL HIGH (ref 0.00–0.07)
Basophils Absolute: 0 10*3/uL (ref 0.0–0.1)
Basophils Relative: 0 %
Eosinophils Absolute: 0 10*3/uL (ref 0.0–0.5)
Eosinophils Relative: 0 %
HCT: 36.3 % (ref 36.0–46.0)
Hemoglobin: 11.9 g/dL — ABNORMAL LOW (ref 12.0–15.0)
Immature Granulocytes: 1 %
Lymphocytes Relative: 6 %
Lymphs Abs: 0.5 10*3/uL — ABNORMAL LOW (ref 0.7–4.0)
MCH: 22.5 pg — ABNORMAL LOW (ref 26.0–34.0)
MCHC: 32.8 g/dL (ref 30.0–36.0)
MCV: 68.5 fL — ABNORMAL LOW (ref 80.0–100.0)
Monocytes Absolute: 0.3 10*3/uL (ref 0.1–1.0)
Monocytes Relative: 3 %
Neutro Abs: 7.5 10*3/uL (ref 1.7–7.7)
Neutrophils Relative %: 90 %
Platelets: 251 10*3/uL (ref 150–400)
RBC: 5.3 MIL/uL — ABNORMAL HIGH (ref 3.87–5.11)
RDW: 20.7 % — ABNORMAL HIGH (ref 11.5–15.5)
WBC: 8.4 10*3/uL (ref 4.0–10.5)
nRBC: 0 % (ref 0.0–0.2)

## 2019-03-30 LAB — GLUCOSE, CAPILLARY
Glucose-Capillary: 476 mg/dL — ABNORMAL HIGH (ref 70–99)
Glucose-Capillary: 410 mg/dL — ABNORMAL HIGH (ref 70–99)
Glucose-Capillary: 269 mg/dL — ABNORMAL HIGH (ref 70–99)

## 2019-03-30 LAB — D-DIMER, QUANTITATIVE: D-Dimer, Quant: 3.13 ug/mL-FEU — ABNORMAL HIGH (ref 0.00–0.50)

## 2019-03-30 LAB — C-REACTIVE PROTEIN: CRP: 7.8 mg/dL — ABNORMAL HIGH (ref <1.0)

## 2019-03-30 LAB — FERRITIN: Ferritin: 1479 ng/mL — ABNORMAL HIGH (ref 11–307)

## 2019-03-30 MED ORDER — DEXAMETHASONE 6 MG PO TABS: 6.0000 mg | ORAL_TABLET | Freq: Every day | ORAL | 0 refills | Status: DC

## 2019-03-30 MED ORDER — POTASSIUM CHLORIDE 10 MEQ/100ML IV SOLN: 10.0000 meq | INTRAVENOUS | Status: AC

## 2019-03-30 MED ORDER — ZINC SULFATE 220 (50 ZN) MG PO CAPS: 220.0000 mg | ORAL_CAPSULE | Freq: Every day | ORAL | 0 refills | Status: DC

## 2019-03-30 MED ORDER — IPRATROPIUM-ALBUTEROL 20-100 MCG/ACT IN AERS: 2.0000 | INHALATION_SPRAY | Freq: Four times a day (QID) | RESPIRATORY_TRACT | 0 refills | Status: DC | PRN

## 2019-03-30 MED ORDER — INSULIN REGULAR(HUMAN) IN NACL 100-0.9 UT/100ML-% IV SOLN: INTRAVENOUS | Status: DC

## 2019-03-30 MED ORDER — ASCORBIC ACID 500 MG PO TABS: 500.0000 mg | ORAL_TABLET | Freq: Every day | ORAL | 0 refills | Status: DC

## 2019-03-30 MED ORDER — SODIUM CHLORIDE 0.9 % IV SOLN: INTRAVENOUS | Status: DC

## 2019-03-30 MED ORDER — DEXTROSE 50 % IV SOLN: 0.0000 mL | INTRAVENOUS | Status: DC | PRN

## 2019-03-30 MED ORDER — DEXTROSE-NACL 5-0.45 % IV SOLN: INTRAVENOUS | Status: DC

## 2019-03-30 NOTE — Progress Notes (Signed)
  [facility] Progress Note    Assessment/ Plan:   1. ESRD on PD, CCPD 2.5L x4 and ico LF--> have done just the day exchanges and no last fill here, will continue since appetite not that good.  Do all 2.5% tonight-- that's what she uses at home. Ok from renal perspective to go with home prescription (couldn't do icodextrin here).  Hyponatremia not as severe corrected for blood glucose of 500; expect to improve with glucose control and with resuming home PD script 2. COVID 19 infection, per [facility], Remdesivir and Steroids, off O2 now 3. DM2 4. HTN 5. Anemia of CKD, Hb 11.3 6. CKD-BMD: no recent labs, on C3 at home 7. Dispo: home tomorrow if everything looking Ok  Subjective:    For d/c today.  Feeling much better and ready to go home   Objective:   BP (!) 119/96 (BP Location: Left Arm)   Pulse 74   Temp (!) 97.5 F (36.4 C) (Oral)   Resp (!) 23   Ht 5\' 8"  (1.727 m)   Wt 103.9 kg   SpO2 92%   BMI 34.83 kg/m   Intake/Output Summary (Last 24 hours) at 03/30/2019 1355 Last data filed at 03/30/2019 0600 Gross per 24 hour  Intake 1295.31 ml  Output --  Net 1295.31 ml   Weight change: 1 kg  Physical Exam: Gen: NAD, sitting in chair CVS: RRR Resp: clear Abd: hyperactive bowel sounds, nontender  Ext: no LE edema  Imaging: No results found.  Labs: BMET Recent Labs  Lab 03/25/19 1835 03/27/19 1723 03/28/19 0500 03/29/19 0231 03/30/19 0405  NA 127* 129* 126* 125* 125*  K 3.0* 3.7 3.4* 3.7 3.4*  CL 86* 89* 87* 86* 84*  CO2 26 19* 18* 19* 19*  GLUCOSE 142* 100* 314* 441* 500*  BUN 44* 69* 69* 74* 79*  CREATININE 9.53* 11.88* 11.15* 10.53* 10.08*  CALCIUM 7.5* 7.8* 7.6* 7.6* 7.7*  PHOS  --  7.0* 6.8*  --   --    CBC Recent Labs  Lab 03/25/19 1835 03/28/19 0500 03/29/19 0231 03/30/19 0405  WBC 7.5 11.0* 8.5 8.4  NEUTROABS 6.1 10.0* 7.5 7.5  HGB 11.3* 11.6* 12.1 11.9*  HCT 36.2 35.1* 37.1 36.3  MCV 71.3* 68.0* 68.7* 68.5*  PLT 229 301 253 251     Medications:    . amLODipine  10 mg Oral Daily  . vitamin C  500 mg Oral Daily  . calcitRIOL  0.5 mcg Oral Once per day on Mon Tue Wed Thu Fri  . calcitRIOL  1 mcg Oral Once per day on Sun Sat  . famotidine  20 mg Oral Daily  . feeding supplement (PRO-STAT SUGAR FREE 64)  30 mL Oral BID  . gentamicin cream  1 application Topical Daily  . heparin injection (subcutaneous)  7,500 Units Subcutaneous Q8H  . Ipratropium-Albuterol  2 puff Inhalation Q6H  . labetalol  200 mg Oral BID  . multivitamin  1 tablet Oral QHS  . zinc sulfate  220 mg Oral Daily      Madelon Lips, MD [facility] pgr [phone] 03/30/2019, 1:55 PM

## 2019-03-30 NOTE — Progress Notes (Signed)
No clinical events overnight. Tolerated PD well. Mild c/o headache, given PRN tylenol w good effect. Expressed some anxiety at having to remain in hospital and not being able to sleep/shower at home. Gave PRN ativan with good effect. Remains on room air. VSS.

## 2019-03-30 NOTE — Discharge Summary (Signed)
Physician Discharge Summary  Janet Mitchell IRJ:[id number] DOB: Sep 11, 1965 DOA: 03/25/2019  PCP: Lucianne Lei, MD  Admit date: 03/25/2019 Discharge date: 03/30/2019  Time spent: 50  minutes  Recommendations for Outpatient Follow-up:  1. *Quarantine till April 15, 2019 2. Follow-up PCP in 3 weeks   Discharge Diagnoses:  Principal Problem:   COVID-19 Active Problems:   Hypertension   ESRD on peritoneal dialysis ([place])   Acute gastroenteritis   Type II diabetes mellitus ([place])   Hypokalemia   Hyponatremia   Anemia in ESRD (end-stage renal disease) ([place])   Discharge Condition: Stable  Diet recommendation: Heart healthy diet  Filed Weights   03/29/19 0748 03/29/19 1956 03/30/19 0855  Weight: 101.8 kg 103 kg 103.9 kg    History of present illness:  54 year old female with history of anemia due to ESRD, history of blood transfusion, CHF, chronic cholecystitis, ESRD on peritoneal dialysis, history of hypertension, spinal headaches, type 2 diabetes mellitus came to ED with complaints of nausea vomiting and diarrhea for about a month.  Patient states that she started having loose stools post cholecystectomy.  She was prescribed Levsin by her gastroenterologist but has not yet started the medication.  She has been using Imodium without significant relief.  Denies abdominal pain, melena or hematochezia.  Denies fever or chills.   Hospital Course:   1. Acute respiratory failure with hypoxia due to SARS COVID-19 pneumonia-patient O2 sats are 95% on room air, patient started on remdesivir, Solu-Medrol.  She has completed 5 days of remdesivir in the hospital. Patient had elevated procalcitonin 1.70, also started on ceftriaxone empirically for superimposed pneumonia.  Patient has completed 5 days of ceftriaxone in the hospital.  CRP is down to 7.8.  She did not require oxygen in the hospital.   At this time she is stable for discharge.  She will need to quarantine for 3 weeks, till April 15, 2019.  We will discharge her on Decadron 6 mg p.o. daily for 5 more days.  2. End-stage renal disease on peritoneal dialysis-continue peritoneal dialysis, nephrology was consulted.   Patient will continue with peritoneal dialysis at home.  Patient still has hyponatremia and hypokalemia, called and discussed with nephrology Dr. Hollie Salk, and she says it is okay for discharge as patient will continue with peritoneal dialysis at home.  3. Type 2 diabetes mellitus with nephropathy-continue glargine insulin 20 units subcu daily at bedtime.  4. Nausea, vomiting/dehydration-resolved, patient was started on gentle IV hydration.  Will avoid further hydration as patient has ESRD on PD.    Nausea and vomiting has resolved.  Likely from COVID-19 infection.  5. Anemia of chronic disease-patient has any medical ESRD, IV iron and Epogen therapy as per nephrology discretion.  6. Hypertension-blood pressure stable, continue manidipine, labetalol.  7. GERD-continue Pepcid   Procedures:  None  Consultations:  Nephrology  Discharge Exam: Vitals:   03/30/19 0555 03/30/19 0855  BP: 130/73 (!) 119/96  Pulse: 77 74  Resp: 14 (!) 23  Temp: 97.7 F (36.5 C) (!) 97.5 F (36.4 C)  SpO2: 95% 92%    General: Appears in no acute distress Cardiovascular: S1-S2, regular Respiratory: Clear to auscultation bilaterally  Discharge Instructions   Discharge Instructions    Diet - low sodium heart healthy   Complete by: As directed    Discharge instructions   Complete by: As directed    Irving Shows Apr 15, 2019   Person Under Monitoring Name: Janet Mitchell  Location: [place]  [id number]   Infection Prevention Recommendations for Individuals Confirmed to have, or Being Evaluated for, 2019 Novel Coronavirus (COVID-19) Infection Who Receive Care at Home  Individuals who are confirmed to have, or are being evaluated for, COVID-19 should follow the prevention steps  below until a healthcare provider or local or state health department says they can return to normal activities.  Stay home except to get medical care You should restrict activities outside your home, except for getting medical care. Do not go to work, school, or public areas, and do not use public transportation or taxis.  Call ahead before visiting your doctor Before your medical appointment, call the healthcare provider and tell them that you have, or are being evaluated for, COVID-19 infection. This will help the healthcare provider's office take steps to keep other people from getting infected. Ask your healthcare provider to call the local or state health department.  Monitor your symptoms Seek prompt medical attention if your illness is worsening (e.g., difficulty breathing). Before going to your medical appointment, call the healthcare provider and tell them that you have, or are being evaluated for, COVID-19 infection. Ask your healthcare provider to call the local or state health department.  Wear a facemask You should wear a facemask that covers your nose and mouth when you are in the same room with other people and when you visit a healthcare provider. People who live with or visit you should also wear a facemask while they are in the same room with you.  Separate yourself from other people in your home As much as possible, you should stay in a different room from other people in your home. Also, you should use a separate bathroom, if available.  Avoid sharing household items You should not share dishes, drinking glasses, cups, eating utensils, towels, bedding, or other items with other people in your home. After using these items, you should wash them thoroughly with soap and water.  Cover your coughs and sneezes Cover your mouth and nose with a tissue when you cough or sneeze, or you can cough or sneeze into your sleeve. Throw used tissues in a lined trash can, and  immediately wash your hands with soap and water for at least 20 seconds or use an alcohol-based hand rub.  Wash your [facility] your hands often and thoroughly with soap and water for at least 20 seconds. You can use an alcohol-based hand sanitizer if soap and water are not available and if your hands are not visibly dirty. Avoid touching your eyes, nose, and mouth with unwashed hands.   Prevention Steps for Caregivers and Household Members of Individuals Confirmed to have, or Being Evaluated for, COVID-19 Infection Being Cared for in the Home  If you live with, or provide care at home for, a person confirmed to have, or being evaluated for, COVID-19 infection please follow these guidelines to prevent infection:  Follow healthcare provider's instructions Make sure that you understand and can help the patient follow any healthcare provider instructions for all care.  Provide for the patient's basic needs You should help the patient with basic needs in the home and provide support for getting groceries, prescriptions, and other personal needs.  Monitor the patient's symptoms If they are getting sicker, call his or her medical provider and tell them that the patient has, or is being evaluated for, COVID-19 infection. This will help the healthcare provider's office take steps to keep other people from getting infected. Ask the healthcare provider to call  the local or state health department.  Limit the number of people who have contact with the patient If possible, have only one caregiver for the patient. Other household members should stay in another home or place of residence. If this is not possible, they should stay in another room, or be separated from the patient as much as possible. Use a separate bathroom, if available. Restrict visitors who do not have an essential need to be in the home.  Keep older adults, very young children, and other sick people away from the patient Keep  older adults, very young children, and those who have compromised immune systems or chronic health conditions away from the patient. This includes people with chronic heart, lung, or kidney conditions, diabetes, and cancer.  Ensure good ventilation Make sure that shared spaces in the home have good air flow, such as from an air conditioner or an opened window, weather permitting.  Wash your hands often Wash your hands often and thoroughly with soap and water for at least 20 seconds. You can use an alcohol based hand sanitizer if soap and water are not available and if your hands are not visibly dirty. Avoid touching your eyes, nose, and mouth with unwashed hands. Use disposable paper towels to dry your hands. If not available, use dedicated cloth towels and replace them when they become wet.  Wear a facemask and gloves Wear a disposable facemask at all times in the room and gloves when you touch or have contact with the patient's blood, body fluids, and/or secretions or excretions, such as sweat, saliva, sputum, nasal mucus, vomit, urine, or feces.  Ensure the mask fits over your nose and mouth tightly, and do not touch it during use. Throw out disposable facemasks and gloves after using them. Do not reuse. Wash your hands immediately after removing your facemask and gloves. If your personal clothing becomes contaminated, carefully remove clothing and launder. Wash your hands after handling contaminated clothing. Place all used disposable facemasks, gloves, and other waste in a lined container before disposing them with other household waste. Remove gloves and wash your hands immediately after handling these items.  Do not share dishes, glasses, or other household items with the patient Avoid sharing household items. You should not share dishes, drinking glasses, cups, eating utensils, towels, bedding, or other items with a patient who is confirmed to have, or being evaluated for, COVID-19  infection. After the person uses these items, you should wash them thoroughly with soap and water.  Wash laundry thoroughly Immediately remove and wash clothes or bedding that have blood, body fluids, and/or secretions or excretions, such as sweat, saliva, sputum, nasal mucus, vomit, urine, or feces, on them. Wear gloves when handling laundry from the patient. Read and follow directions on labels of laundry or clothing items and detergent. In general, wash and dry with the warmest temperatures recommended on the label.  Clean all areas the individual has used often Clean all touchable surfaces, such as counters, tabletops, doorknobs, bathroom fixtures, toilets, phones, keyboards, tablets, and bedside tables, every day. Also, clean any surfaces that may have blood, body fluids, and/or secretions or excretions on them. Wear gloves when cleaning surfaces the patient has come in contact with. Use a diluted bleach solution (e.g., dilute bleach with 1 part bleach and 10 parts water) or a household disinfectant with a label that says EPA-registered for coronaviruses. To make a bleach solution at home, add 1 tablespoon of bleach to 1 quart (4 cups) of water.  For a larger supply, add  cup of bleach to 1 gallon (16 cups) of water. Read labels of cleaning products and follow recommendations provided on product labels. Labels contain instructions for safe and effective use of the cleaning product including precautions you should take when applying the product, such as wearing gloves or eye protection and making sure you have good ventilation during use of the product. Remove gloves and wash hands immediately after cleaning.  Monitor yourself for signs and symptoms of illness Caregivers and household members are considered close contacts, should monitor their health, and will be asked to limit movement outside of the home to the extent possible. Follow the monitoring steps for close contacts listed on  the symptom monitoring form.   ? If you have additional questions, contact your local health department or call the epidemiologist on call at [phone] (available 24/7). ? This guidance is subject to change. For the most up-to-date guidance from CDC, please refer to their website: YouBlogs.pl   Increase activity slowly   Complete by: As directed      Allergies as of 03/30/2019   No Known Allergies     Medication List    TAKE these medications   Accu-Chek Guide test strip Generic drug: glucose blood Use as instructed   Accu-Chek Guide w/Device Kit 1 Piece by Does not apply route as directed.   ALPRAZolam 0.5 MG tablet Commonly known as: XANAX Take 0.5 mg by mouth at bedtime as needed for anxiety.   amLODipine 10 MG tablet Commonly known as: NORVASC Take 1 tablet (10 mg total) by mouth daily.   ascorbic acid 500 MG tablet Commonly known as: VITAMIN C Take 1 tablet (500 mg total) by mouth daily. Start taking on: March 31, 2019   aspirin EC 81 MG tablet Take 81 mg by mouth daily.   calcitRIOL 0.5 MCG capsule Commonly known as: ROCALTROL Take 0.5-1 mcg by mouth See admin instructions. 0.53mg on Mondays through Fridays and take 175m on Saturdays and Sundays   dexamethasone 6 MG tablet Commonly known as: DECADRON Take 1 tablet (6 mg total) by mouth daily.   doxazosin 8 MG tablet Commonly known as: CARDURA Take 8 mg by mouth at bedtime.   Eucrisa 2 % Oint Generic drug: Crisaborole Apply 1 application topically daily as needed (for rash/irritation).   Hyoscyamine Sulfate SL 0.125 MG Subl Commonly known as: Levsin/SL Place 0.125 mg under the tongue 3 (three) times daily as needed (diarrhea).   Insulin Glargine (2 Unit Dial) 300 UNIT/ML Sopn Inject 20 Units into the skin at bedtime.   Ipratropium-Albuterol 20-100 MCG/ACT Aers respimat Commonly known as: COMBIVENT Inhale 2 puffs into the lungs  every 6 (six) hours as needed for wheezing or shortness of breath.   labetalol 200 MG tablet Commonly known as: NORMODYNE Take 200 mg by mouth 2 (two) times daily.   losartan 100 MG tablet Commonly known as: COZAAR Take 100 mg by mouth daily.   zinc sulfate 220 (50 Zn) MG capsule Take 1 capsule (220 mg total) by mouth daily. Start taking on: March 31, 2019      No Known Allergies    The results of significant diagnostics from this hospitalization (including imaging, microbiology, ancillary and laboratory) are listed below for reference.    Significant Diagnostic Studies: Acute Abd Series  Result Date: 03/25/2019 CLINICAL DATA:  Vomiting and diarrhea EXAM: DG ABDOMEN ACUTE W/ 1V CHEST COMPARISON:  03/24/2018 FINDINGS: Extensive diffuse bilateral airspace disease, with progression from the prior  study. Upper and lower lobe airspace disease bilaterally. No effusion. Heart size upper normal. Normal bowel gas pattern.  No obstruction or ileus.  No free air. Surgical clips in the gallbladder fossa. Fracture fixation of left acetabular fracture with metal plates and screws. IMPRESSION: Diffuse bilateral airspace disease, with progression. There appears to be an element of chronic lung disease with progression. Possible superimposed pneumonia. Normal bowel gas pattern. Electronically Signed   By: Franchot Gallo M.D.   On: 03/25/2019 21:12    Microbiology: Recent Results (from the past 240 hour(s))  Respiratory Panel by RT PCR (Flu A&B, Covid) - Nasopharyngeal Swab     Status: Abnormal   Collection Time: 03/25/19 11:32 PM   Specimen: Nasopharyngeal Swab  Result Value Ref Range Status   SARS Coronavirus 2 by RT PCR POSITIVE (A) NEGATIVE Final    Comment: RESULT CALLED TO, READ BACK BY AND VERIFIED WITH: EASTER,T. AT 0031 ON 03/26/2019 BY EVA (NOTE) SARS-CoV-2 target nucleic acids are DETECTED. SARS-CoV-2 RNA is generally detectable in upper respiratory specimens  during the acute  phase of infection. Positive results are indicative of the presence of the identified virus, but do not rule out bacterial infection or co-infection with other pathogens not detected by the test. Clinical correlation with patient history and other diagnostic information is necessary to determine patient infection status. The expected result is Negative. Fact Sheet for Patients:  PinkCheek.be Fact Sheet for Healthcare Providers: GravelBags.it This test is not yet approved or cleared by the [place] FDA and  has been authorized for detection and/or diagnosis of SARS-CoV-2 by FDA under an Emergency Use Authorization (EUA).  This EUA will remain in effect (meaning this test can be used ) for the duration of  the COVID-19 declaration under Section 564(b)(1) of the Act, 21 U.S.C. section 360bbb-3(b)(1), unless the authorization is terminated or revoked sooner.    Influenza A by PCR NEGATIVE NEGATIVE Final   Influenza B by PCR NEGATIVE NEGATIVE Final    Comment: (NOTE) The Xpert Xpress SARS-CoV-2/FLU/RSV assay is intended as an aid in  the diagnosis of influenza from Nasopharyngeal swab specimens and  should not be used as a sole basis for treatment. Nasal washings and  aspirates are unacceptable for Xpert Xpress SARS-CoV-2/FLU/RSV  testing. Fact Sheet for Patients: PinkCheek.be Fact Sheet for Healthcare Providers: GravelBags.it This test is not yet approved or cleared by the [place] FDA and  has been authorized for detection and/or diagnosis of SARS-CoV-2 by  FDA under an Emergency Use Authorization (EUA). This EUA will remain  in effect (meaning this test can be used) for the duration of the  Covid-19 declaration under Section 564(b)(1) of the Act, 21  U.S.C. section 360bbb-3(b)(1), unless the authorization is  terminated or revoked. Performed at [facility], [address] [place], [place] [address]      Labs: Basic Metabolic Panel: Recent Labs  Lab 03/25/19 1835 03/27/19 1723 03/28/19 0500 03/29/19 0231 03/30/19 0405  NA 127* 129* 126* 125* 125*  K 3.0* 3.7 3.4* 3.7 3.4*  CL 86* 89* 87* 86* 84*  CO2 26 19* 18* 19* 19*  GLUCOSE 142* 100* 314* 441* 500*  BUN 44* 69* 69* 74* 79*  CREATININE 9.53* 11.88* 11.15* 10.53* 10.08*  CALCIUM 7.5* 7.8* 7.6* 7.6* 7.7*  MG  --   --  1.6* 1.5* 1.9  PHOS  --  7.0* 6.8*  --   --    Liver Function Tests: Recent Labs  Lab 03/25/19 1835 03/27/19  1723 03/28/19 0500 03/29/19 0231 03/30/19 0405  AST 17  --  17 20 13*  ALT 6  --  _0 ALKPHOS 102  --  103 94 92  BILITOT 0.7  --  0.7 0.8 0.6  PROT 7.1  --  6.9 6.9 6.4*  ALBUMIN 1.8* 1.7* 1.7* 1.8* 1.8*   No results for input(s): LIPASE, AMYLASE in the last 168 hours. No results for input(s): AMMONIA in the last 168 hours. CBC: Recent Labs  Lab 03/25/19 1835 03/28/19 0500 03/29/19 0231 03/30/19 0405  WBC 7.5 11.0* 8.5 8.4  NEUTROABS 6.1 10.0* 7.5 7.5  HGB 11.3* 11.6* 12.1 11.9*  HCT 36.2 35.1* 37.1 36.3  MCV 71.3* 68.0* 68.7* 68.5*  PLT 229 301 253 251   Cardiac Enzymes: No results for input(s): CKTOTAL, CKMB, CKMBINDEX, TROPONINI in the last 168 hours. BNP: BNP (last 3 results) No results for input(s): BNP in the last 8760 hours.  ProBNP (last 3 results) No results for input(s): PROBNP in the last 8760 hours.  CBG: Recent Labs  Lab 03/29/19 0750 03/29/19 1242 03/29/19 1616 03/29/19 2156 03/30/19 0754  GLUCAP 419* 254* 230* 294* 476*       Signed:  Oswald Hillock MD.  [facility] 03/30/2019, 11:16 AM

## 2019-03-30 NOTE — Discharge Instructions (Signed)
Need to quarantine till jan 18,2021  Person Under Monitoring Name: Janet Mitchell  Location: [place] [id number]   Infection Prevention Recommendations for Individuals Confirmed to have, or Being Evaluated for, 2019 Novel Coronavirus (COVID-19) Infection Who Receive Care at Home  Individuals who are confirmed to have, or are being evaluated for, COVID-19 should follow the prevention steps below until a healthcare provider or local or state health department says they can return to normal activities.  Stay home except to get medical care You should restrict activities outside your home, except for getting medical care. Do not go to work, school, or public areas, and do not use public transportation or taxis.  Call ahead before visiting your doctor Before your medical appointment, call the healthcare provider and tell them that you have, or are being evaluated for, COVID-19 infection. This will help the healthcare provider's office take steps to keep other people from getting infected. Ask your healthcare provider to call the local or state health department.  Monitor your symptoms Seek prompt medical attention if your illness is worsening (e.g., difficulty breathing). Before going to your medical appointment, call the healthcare provider and tell them that you have, or are being evaluated for, COVID-19 infection. Ask your healthcare provider to call the local or state health department.  Wear a facemask You should wear a facemask that covers your nose and mouth when you are in the same room with other people and when you visit a healthcare provider. People who live with or visit you should also wear a facemask while they are in the same room with you.  Separate yourself from other people in your home As much as possible, you should stay in a different room from other people in your home. Also, you  should use a separate bathroom, if available.  Avoid sharing household items You should not share dishes, drinking glasses, cups, eating utensils, towels, bedding, or other items with other people in your home. After using these items, you should wash them thoroughly with soap and water.  Cover your coughs and sneezes Cover your mouth and nose with a tissue when you cough or sneeze, or you can cough or sneeze into your sleeve. Throw used tissues in a lined trash can, and immediately wash your hands with soap and water for at least 20 seconds or use an alcohol-based hand rub.  Wash your [facility] your hands often and thoroughly with soap and water for at least 20 seconds. You can use an alcohol-based hand sanitizer if soap and water are not available and if your hands are not visibly dirty. Avoid touching your eyes, nose, and mouth with unwashed hands.   Prevention Steps for Caregivers and Household Members of Individuals Confirmed to have, or Being Evaluated for, COVID-19 Infection Being Cared for in the Home  If you live with, or provide care at home for, a person confirmed to have, or being evaluated for, COVID-19 infection please follow these guidelines to prevent infection:  Follow healthcare provider's instructions Make sure that you understand and can help the patient follow any healthcare provider instructions for  all care.  Provide for the patient's basic needs You should help the patient with basic needs in the home and provide support for getting groceries, prescriptions, and other personal needs.  Monitor the patient's symptoms If they are getting sicker, call his or her medical provider and tell them that the patient has, or is being evaluated for, COVID-19 infection. This will help the healthcare provider's office take steps to keep other people from getting infected. Ask the healthcare provider to call the local or state health department.  Limit the number of  people who have contact with the patient  If possible, have only one caregiver for the patient.  Other household members should stay in another home or place of residence. If this is not possible, they should stay  in another room, or be separated from the patient as much as possible. Use a separate bathroom, if available.  Restrict visitors who do not have an essential need to be in the home.  Keep older adults, very young children, and other sick people away from the patient Keep older adults, very young children, and those who have compromised immune systems or chronic health conditions away from the patient. This includes people with chronic heart, lung, or kidney conditions, diabetes, and cancer.  Ensure good ventilation Make sure that shared spaces in the home have good air flow, such as from an air conditioner or an opened window, weather permitting.  Wash your hands often  Wash your hands often and thoroughly with soap and water for at least 20 seconds. You can use an alcohol based hand sanitizer if soap and water are not available and if your hands are not visibly dirty.  Avoid touching your eyes, nose, and mouth with unwashed hands.  Use disposable paper towels to dry your hands. If not available, use dedicated cloth towels and replace them when they become wet.  Wear a facemask and gloves  Wear a disposable facemask at all times in the room and gloves when you touch or have contact with the patient's blood, body fluids, and/or secretions or excretions, such as sweat, saliva, sputum, nasal mucus, vomit, urine, or feces.  Ensure the mask fits over your nose and mouth tightly, and do not touch it during use.  Throw out disposable facemasks and gloves after using them. Do not reuse.  Wash your hands immediately after removing your facemask and gloves.  If your personal clothing becomes contaminated, carefully remove clothing and launder. Wash your hands after handling  contaminated clothing.  Place all used disposable facemasks, gloves, and other waste in a lined container before disposing them with other household waste.  Remove gloves and wash your hands immediately after handling these items.  Do not share dishes, glasses, or other household items with the patient  Avoid sharing household items. You should not share dishes, drinking glasses, cups, eating utensils, towels, bedding, or other items with a patient who is confirmed to have, or being evaluated for, COVID-19 infection.  After the person uses these items, you should wash them thoroughly with soap and water.  Wash laundry thoroughly  Immediately remove and wash clothes or bedding that have blood, body fluids, and/or secretions or excretions, such as sweat, saliva, sputum, nasal mucus, vomit, urine, or feces, on them.  Wear gloves when handling laundry from the patient.  Read and follow directions on labels of laundry or clothing items and detergent. In general, wash and dry with the warmest temperatures recommended on the label.  Clean all  areas the individual has used often  Clean all touchable surfaces, such as counters, tabletops, doorknobs, bathroom fixtures, toilets, phones, keyboards, tablets, and bedside tables, every day. Also, clean any surfaces that may have blood, body fluids, and/or secretions or excretions on them.  Wear gloves when cleaning surfaces the patient has come in contact with.  Use a diluted bleach solution (e.g., dilute bleach with 1 part bleach and 10 parts water) or a household disinfectant with a label that says EPA-registered for coronaviruses. To make a bleach solution at home, add 1 tablespoon of bleach to 1 quart (4 cups) of water. For a larger supply, add  cup of bleach to 1 gallon (16 cups) of water.  Read labels of cleaning products and follow recommendations provided on product labels. Labels contain instructions for safe and effective use of the cleaning  product including precautions you should take when applying the product, such as wearing gloves or eye protection and making sure you have good ventilation during use of the product.  Remove gloves and wash hands immediately after cleaning.  Monitor yourself for signs and symptoms of illness Caregivers and household members are considered close contacts, should monitor their health, and will be asked to limit movement outside of the home to the extent possible. Follow the monitoring steps for close contacts listed on the symptom monitoring form.   ? If you have additional questions, contact your local health department or call the epidemiologist on call at [phone] (available 24/7). ? This guidance is subject to change. For the most up-to-date guidance from [facility], please refer to their website: [email or web address]

## 2019-03-30 NOTE — Progress Notes (Signed)
Patient blood glucose was elevated this morning over 400, repeat blood glucose is 218.  Anion gap is 22.  Called and discussed with nephrologist Dr. Hollie Salk, she states that patient's bicarb usually stays in teens at this time it is ranging from 2-19.  She does not think that patient is in DKA.  I agree with her that patient has other metabolic derangements due to ESRD, she is on peritoneal dialysis.  Her blood glucose has been elevated due to high-dose Solu-Medrol she was receiving in the hospital.  At this time they want to send her home on Decadron 6 mg daily for 5 more days.  Her blood glucose should improve with reduced dose of Decadron.  She will resume home peritoneal dialysis.  Will discharge patient.

## 2019-03-31 ENCOUNTER — Encounter (HOSPITAL_COMMUNITY): Payer: Self-pay

## 2019-03-31 ENCOUNTER — Emergency Department (HOSPITAL_COMMUNITY)
Admission: EM | Admit: 2019-03-31 | Discharge: 2019-04-01 | Discharge status: Against Medical Advice | Payer: Medicare Other | Attending: Emergency Medicine | Admitting: Emergency Medicine

## 2019-03-31 DIAGNOSIS — R531 Weakness: Secondary | ICD-10-CM | POA: Diagnosis not present

## 2019-03-31 DIAGNOSIS — Z5320 Procedure and treatment not carried out because of patient's decision for unspecified reasons: Secondary | ICD-10-CM | POA: Diagnosis not present

## 2019-03-31 DIAGNOSIS — Z794 Long term (current) use of insulin: Secondary | ICD-10-CM | POA: Diagnosis not present

## 2019-03-31 DIAGNOSIS — E119 Type 2 diabetes mellitus without complications: Secondary | ICD-10-CM | POA: Insufficient documentation

## 2019-03-31 DIAGNOSIS — I10 Essential (primary) hypertension: Secondary | ICD-10-CM | POA: Insufficient documentation

## 2019-03-31 DIAGNOSIS — R519 Headache, unspecified: Secondary | ICD-10-CM | POA: Insufficient documentation

## 2019-03-31 DIAGNOSIS — R29818 Other symptoms and signs involving the nervous system: Secondary | ICD-10-CM | POA: Diagnosis not present

## 2019-03-31 DIAGNOSIS — G43009 Migraine without aura, not intractable, without status migrainosus: Secondary | ICD-10-CM | POA: Diagnosis not present

## 2019-03-31 LAB — BASIC METABOLIC PANEL
Anion gap: 21 — ABNORMAL HIGH (ref 5–15)
BUN: 112 mg/dL — ABNORMAL HIGH (ref 6–20)
CO2: 20 mmol/L — ABNORMAL LOW (ref 22–32)
Calcium: 7.6 mg/dL — ABNORMAL LOW (ref 8.9–10.3)
Chloride: 85 mmol/L — ABNORMAL LOW (ref 98–111)
Creatinine, Ser: 12.32 mg/dL — ABNORMAL HIGH (ref 0.44–1.00)
GFR calc Af Amer: 4 mL/min — ABNORMAL LOW (ref >60)
GFR calc non Af Amer: 3 mL/min — ABNORMAL LOW (ref >60)
Glucose, Bld: 292 mg/dL — ABNORMAL HIGH (ref 70–99)
Potassium: 3.9 mmol/L (ref 3.5–5.1)
Sodium: 126 mmol/L — ABNORMAL LOW (ref 135–145)

## 2019-03-31 LAB — CBC
HCT: 38.2 % (ref 36.0–46.0)
Hemoglobin: 12.6 g/dL (ref 12.0–15.0)
MCH: 22.5 pg — ABNORMAL LOW (ref 26.0–34.0)
MCHC: 33 g/dL (ref 30.0–36.0)
MCV: 68.2 fL — ABNORMAL LOW (ref 80.0–100.0)
Platelets: 287 10*3/uL (ref 150–400)
RBC: 5.6 MIL/uL — ABNORMAL HIGH (ref 3.87–5.11)
RDW: 20.4 % — ABNORMAL HIGH (ref 11.5–15.5)
WBC: 6.7 10*3/uL (ref 4.0–10.5)
nRBC: 0 % (ref 0.0–0.2)

## 2019-03-31 LAB — I-STAT BETA HCG BLOOD, ED (MC, WL, AP ONLY): I-stat hCG, quantitative: 5.5 m[IU]/mL — ABNORMAL HIGH (ref <5)

## 2019-03-31 MED ORDER — SODIUM CHLORIDE 0.9% FLUSH: 3.0000 mL | Freq: Once | INTRAVENOUS | Status: DC

## 2019-03-31 NOTE — ED Triage Notes (Signed)
Pt was just discharged from hospital yesterday, has been having generalized weakness, today having headache, reports slurred speech today, none noted now and neuro intact bilaterally, pt covid +

## 2019-04-01 ENCOUNTER — Other Ambulatory Visit: Payer: Self-pay

## 2019-04-01 ENCOUNTER — Emergency Department (HOSPITAL_COMMUNITY): Payer: Medicare Other

## 2019-04-01 ENCOUNTER — Ambulatory Visit (HOSPITAL_COMMUNITY): Admission: RE | Admit: 2019-04-01 | Payer: Medicare Other | Source: Ambulatory Visit

## 2019-04-01 ENCOUNTER — Emergency Department (HOSPITAL_COMMUNITY)
Admission: EM | Admit: 2019-04-01 | Discharge: 2019-04-01 | Disposition: A | Discharge status: Home / Self Care | Payer: Medicare Other | Source: Home / Self Care | Attending: Emergency Medicine | Admitting: Emergency Medicine

## 2019-04-01 ENCOUNTER — Encounter (HOSPITAL_COMMUNITY): Payer: Self-pay | Admitting: *Deleted

## 2019-04-01 DIAGNOSIS — G43009 Migraine without aura, not intractable, without status migrainosus: Secondary | ICD-10-CM

## 2019-04-01 DIAGNOSIS — Z992 Dependence on renal dialysis: Secondary | ICD-10-CM | POA: Insufficient documentation

## 2019-04-01 DIAGNOSIS — Z7722 Contact with and (suspected) exposure to environmental tobacco smoke (acute) (chronic): Secondary | ICD-10-CM | POA: Insufficient documentation

## 2019-04-01 DIAGNOSIS — E1122 Type 2 diabetes mellitus with diabetic chronic kidney disease: Secondary | ICD-10-CM | POA: Insufficient documentation

## 2019-04-01 DIAGNOSIS — I132 Hypertensive heart and chronic kidney disease with heart failure and with stage 5 chronic kidney disease, or end stage renal disease: Secondary | ICD-10-CM | POA: Insufficient documentation

## 2019-04-01 DIAGNOSIS — R29818 Other symptoms and signs involving the nervous system: Secondary | ICD-10-CM | POA: Diagnosis not present

## 2019-04-01 DIAGNOSIS — U071 COVID-19: Secondary | ICD-10-CM | POA: Insufficient documentation

## 2019-04-01 DIAGNOSIS — R531 Weakness: Secondary | ICD-10-CM | POA: Insufficient documentation

## 2019-04-01 DIAGNOSIS — I509 Heart failure, unspecified: Secondary | ICD-10-CM | POA: Insufficient documentation

## 2019-04-01 DIAGNOSIS — N186 End stage renal disease: Secondary | ICD-10-CM | POA: Insufficient documentation

## 2019-04-01 DIAGNOSIS — Z79899 Other long term (current) drug therapy: Secondary | ICD-10-CM | POA: Insufficient documentation

## 2019-04-01 DIAGNOSIS — Z794 Long term (current) use of insulin: Secondary | ICD-10-CM | POA: Insufficient documentation

## 2019-04-01 DIAGNOSIS — E11311 Type 2 diabetes mellitus with unspecified diabetic retinopathy with macular edema: Secondary | ICD-10-CM | POA: Insufficient documentation

## 2019-04-01 IMAGING — CT CT HEAD W/O CM
3 series · 15 of 47 positions shown, 18 images · non-contrast
Comparison: Brain MRI [DATE]

CLINICAL DATA: Neuro deficit(s), subacute recent hospitalization
and discharge for COVID. Persistent nausea, vomiting, and weakness.

EXAM:
CT HEAD WITHOUT CONTRAST
TECHNIQUE: Contiguous axial images were obtained from the base of the skull
through the vertex without intravenous contrast.

[Series 2: head w o · axial · 0.46mm/px · z∈[+800,+925]mm · 9 of 30 slices shown, 12 images]
[im 3/30  brain]
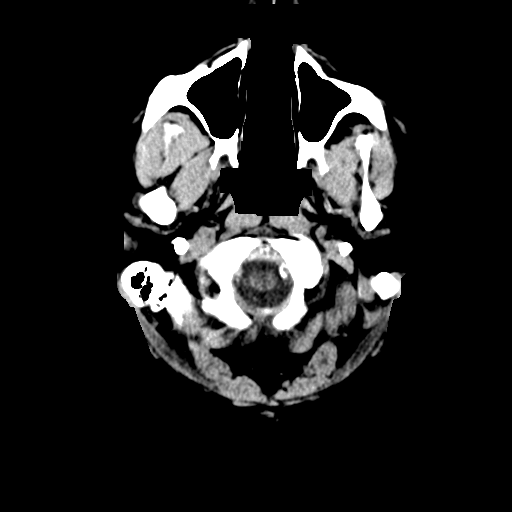
[im 3/30  bone]
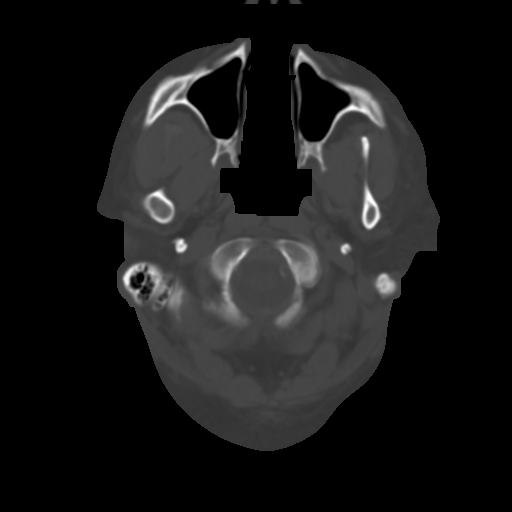
[im 6/30  brain]
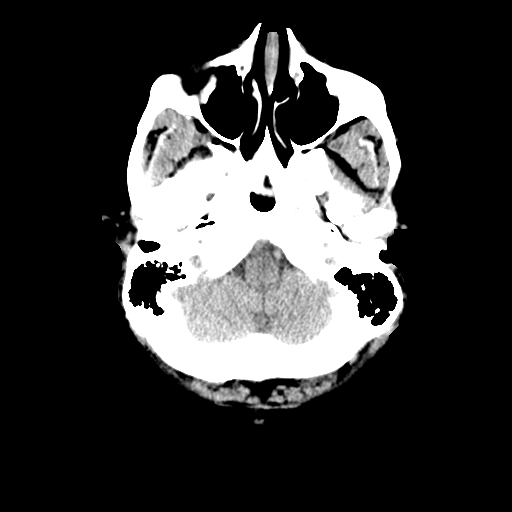
[im 9/30  brain]
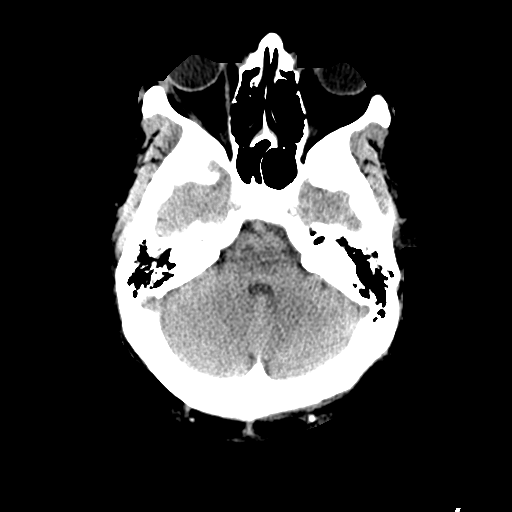
[im 12/30  brain]
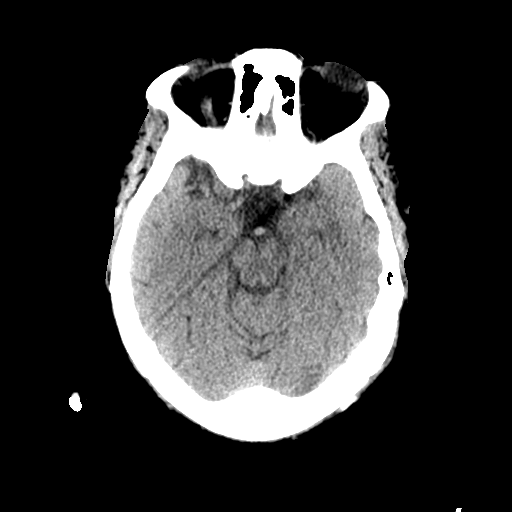
[im 16/30  brain]
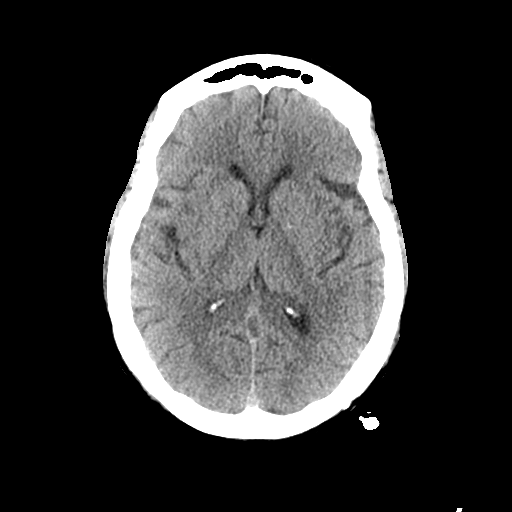
[im 16/30  bone]
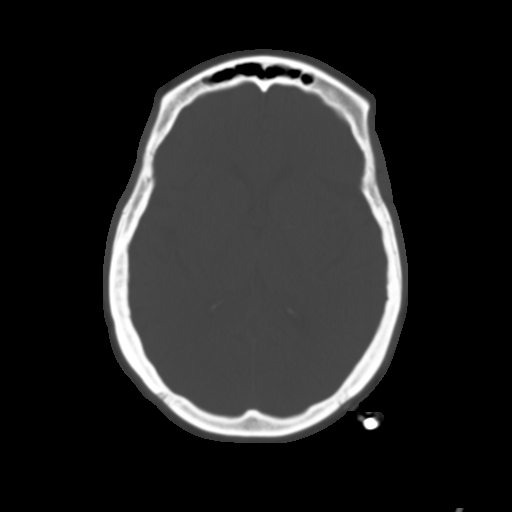
[im 19/30  brain]
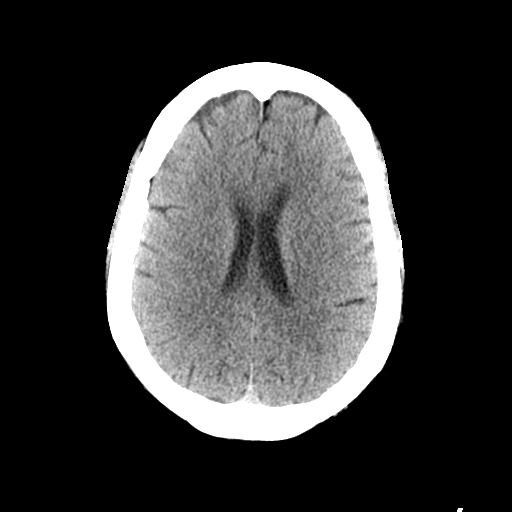
[im 22/30  brain]
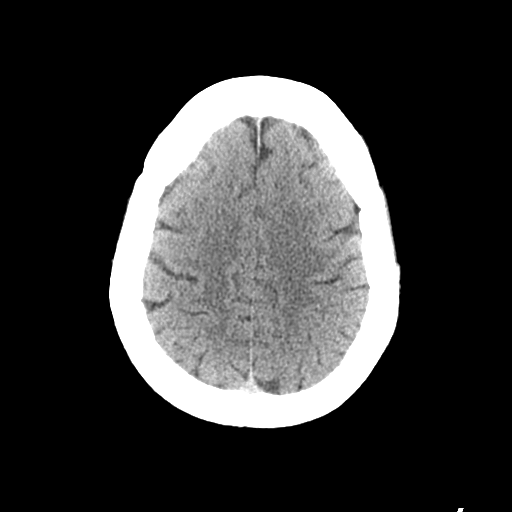
[im 25/30  brain]
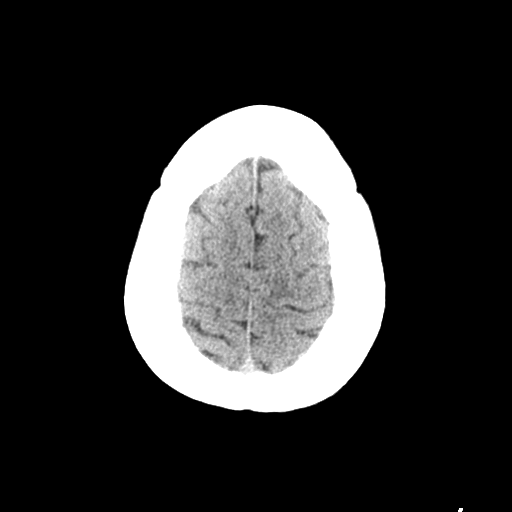
[im 28/30  brain]
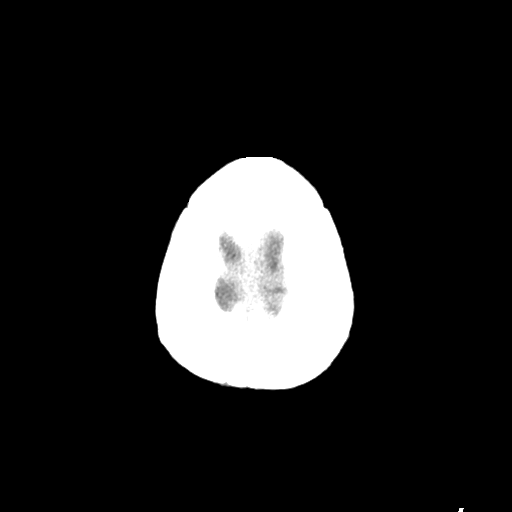
[im 28/30  bone]
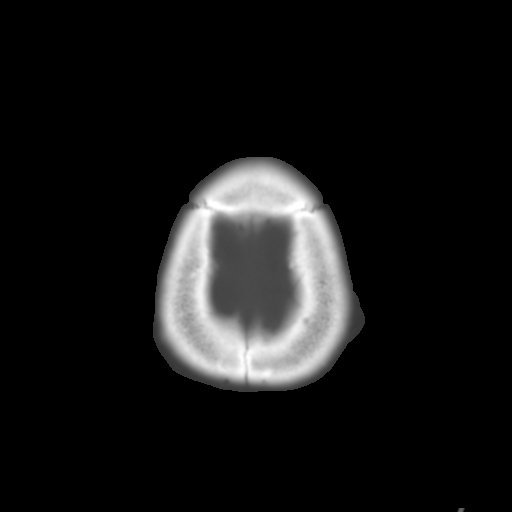

[Series 4: coronal soft · coronal · 0.34mm/px · 3 of 71 slices shown]
[im 24/71  brain]
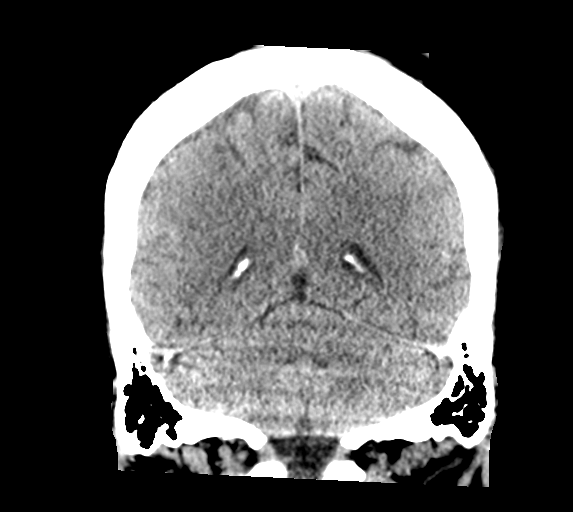
[im 32/71  brain]
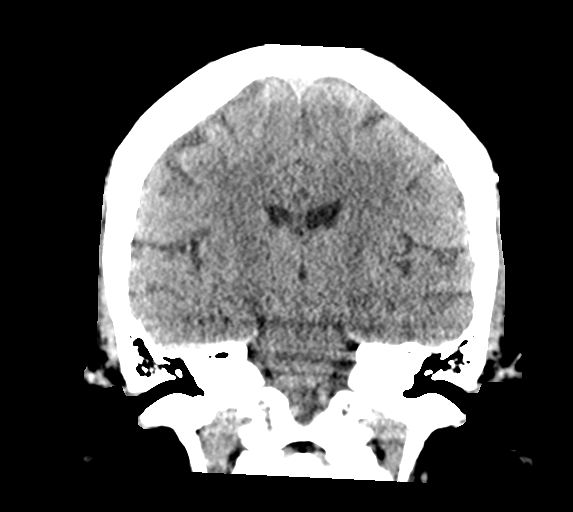
[im 39/71  brain]
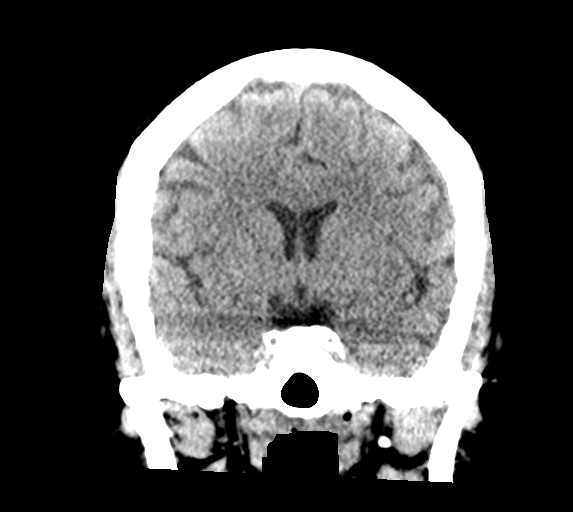

[Series 5: sagittal soft · sagittal · 0.34mm/px · 3 of 60 slices shown]
[im 20/60  brain]
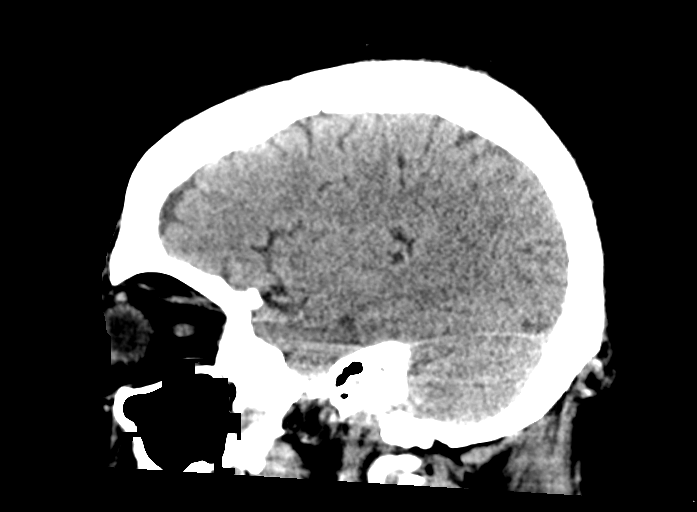
[im 30/60  brain]
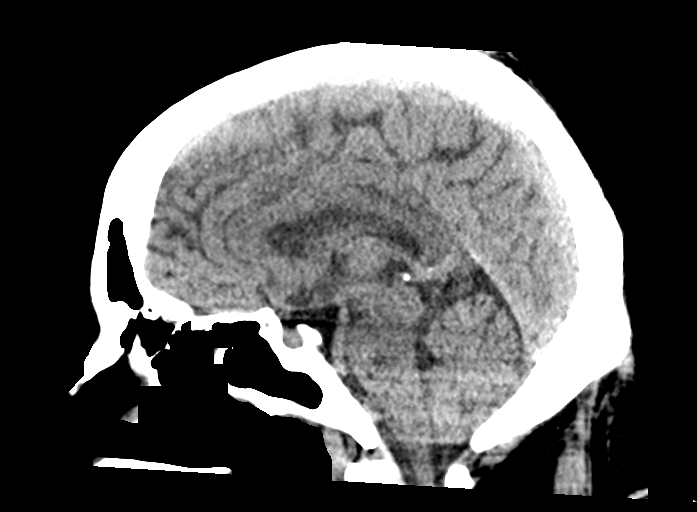
[im 40/60  brain]
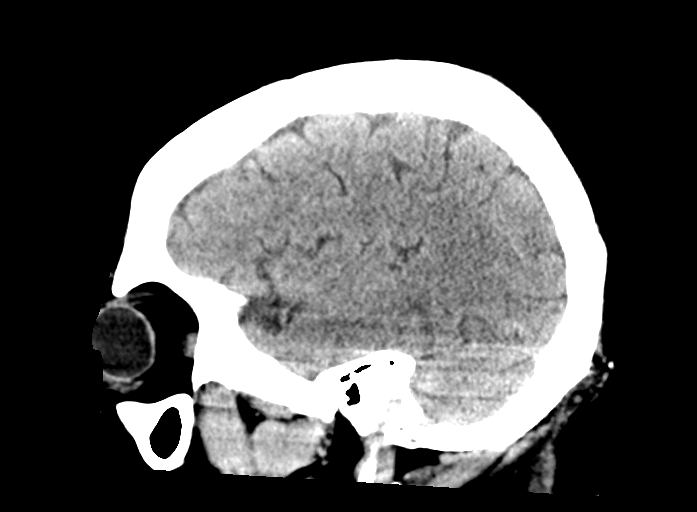

[15 of 47 positions shown; findings below may reference images not displayed]

FINDINGS: Brain: No intracranial hemorrhage, mass effect, or midline shift. No
hydrocephalus. The basilar cisterns are patent. The small remote
left occipital infarct on MRI is not well seen by CT. No evidence of
territorial infarct or acute ischemia. No extra-axial or
intracranial fluid collection.

Vascular: Atherosclerosis of skullbase vasculature without
hyperdense vessel or abnormal calcification.

Skull: No fracture or focal lesion.

Sinuses/Orbits: Paranasal sinuses and mastoid air cells are clear.
The visualized orbits are unremarkable.

Other: None.
IMPRESSION: No acute intracranial abnormality.

## 2019-04-01 MED ORDER — DIPHENHYDRAMINE HCL 50 MG/ML IJ SOLN
25.0000 mg | Freq: Once | INTRAMUSCULAR | Status: DC
  Filled 2019-04-01: qty 1

## 2019-04-01 MED ORDER — METOCLOPRAMIDE HCL 5 MG/ML IJ SOLN
10.0000 mg | Freq: Once | INTRAMUSCULAR | Status: AC
  Administered 2019-04-01: 04:00:00 10 mg via INTRAMUSCULAR

## 2019-04-01 MED ORDER — DIPHENHYDRAMINE HCL 50 MG/ML IJ SOLN
25.0000 mg | Freq: Once | INTRAMUSCULAR | Status: AC
  Administered 2019-04-01: 25 mg via INTRAMUSCULAR

## 2019-04-01 MED ORDER — METOCLOPRAMIDE HCL 5 MG/ML IJ SOLN
10.0000 mg | Freq: Once | INTRAMUSCULAR | Status: DC
  Filled 2019-04-01: qty 2

## 2019-04-01 NOTE — ED Notes (Signed)
Pt states that problems with speech and weakness started yesterday, pt speaks in a low tone in triage but speech is clear,

## 2019-04-01 NOTE — ED Triage Notes (Signed)
Pt states that she was recently discharged from the hospital for covid, c/o continued nausea,vomiting, pt also states that her speech 'is very unusual", pt also weakness as well,.

## 2019-04-01 NOTE — Discharge Instructions (Addendum)
Recheck as needed.  If your symptoms should return you need to let the physician treating you know that sometimes you have trouble talking with your headaches.  Look at the information about migraine headaches to see if there is things she can do to help prevent them from happening.  As far as your Covid return to the emergency room if you get chest pain or have trouble breathing.

## 2019-04-01 NOTE — ED Provider Notes (Signed)
[facility] EMERGENCY DEPARTMENT Provider Note   CSN: [id number] Arrival date & time: 04/01/19  0017   Time seen 01:55 AM  History Chief Complaint  Patient presents with  . Emesis    Janet Mitchell is a 54 y.o. female.  HPI patient was admitted December 28 and discharged on January 2.  She presented with nausea, vomiting, and diarrhea for a month.  She had a positive Covid test.  She is supposed to be in isolation until January 18.  Patient received 5 days of remdesivir and got IV Solu-Medrol and was discharged home from the hospital on Decadron.  She states later that evening on the second she started having a frontal headache, associated with some difficulty speaking, weakness in both legs and mild weakness in her arms.  She denies any visual changes but states she has nausea and when the headache gets bad she has vomiting and has had vomiting about 5-6 times.  She states she has had these headaches before and gets them about every 3 months.  She has had speech problems before with a headache.  She states she is also had a stroke before.  She denies fever, neck pain, or any urinary difficulty.  She does describe photophobia but not phonophobia.  PCP Lucianne Lei, MD      Past Medical History:  Diagnosis Date  . Anemia   . CHF (congestive heart failure) ([place])   . Chronic cholecystitis with calculus   . ESRD (end stage renal disease) on dialysis [facility])    "peritoneal dialysis q hs" (03/09/2018)  . Headache    "a few/wk" (03/09/2018)  . History of blood transfusion 10/2017   "low blood count" (03/09/2018)  . Hypertension   . Spinal headache   . Type II diabetes mellitus [facility])     Patient Active Problem List   Diagnosis Date Noted  . COVID-19 03/26/2019  . Acute gastroenteritis 03/25/2019  . Type II diabetes mellitus ([place])   . Hypokalemia   . Hyponatremia   . Anemia in ESRD (end-stage renal disease) ([place])   . Essential hypertension, benign 02/14/2019  . Class 2 severe  obesity due to excess calories with serious comorbidity and body mass index (BMI) of 37.0 to 37.9 in adult [facility]) 02/14/2019  . SBP (spontaneous bacterial peritonitis) ([place]) 12/18/2018  . Abdominal pain 11/28/2018  . Rectal burning 08/27/2018  . Rectal bleeding 08/27/2018  . Diarrhea 05/22/2018  . Fluid overload 03/25/2018  . Microcytic anemia 03/25/2018  . Chronic cholecystitis with calculus 03/09/2018  . Type 2 diabetes mellitus with ESRD (end-stage renal disease) ([place]) 03/09/2018  . Pre-transplant evaluation for ESRD (end stage renal disease) 12/19/2017  . Symptomatic anemia 11/06/2017  . Hypertension 11/06/2017  . ESRD on peritoneal dialysis ([place]) 11/06/2017  . Diabetic macular edema ([place]) 04/10/2015  . Edema of lower extremity 04/10/2015  . Uncontrolled type 2 diabetes mellitus ([place]) 04/10/2015    Past Surgical History:  Procedure Laterality Date  . AMPUTATION TOE Left 2013   Great toe  . BIOPSY  10/24/2018   Procedure: BIOPSY;  Surgeon: Daneil Dolin, MD;  Location: [facility];  Service: Endoscopy;;  right and left colon  . CATARACT EXTRACTION W/ INTRAOCULAR LENS IMPLANT Right   . CESAREAN SECTION  1994; 1998  . CHOLECYSTECTOMY N/A 03/09/2018   Procedure: LAPAROSCOPIC CHOLECYSTECTOMY WITH INTRAOPERATIVE CHOLANGIOGRAM ERAS PATHWAY;  Surgeon: Donnie Mesa, MD;  Location: [place];  Service: General;  Laterality: N/A;  . COLONOSCOPY N/A 10/24/2018   Procedure:  COLONOSCOPY;  Surgeon: Daneil Dolin, MD;  Location: [facility];  Service: Endoscopy;  Laterality: N/A;  1:00pm  . EYE SURGERY  05/15/2018   Removal of blood in the globe (due to DM)  . FLEXIBLE SIGMOIDOSCOPY N/A 11/24/2017   Procedure: FLEXIBLE SIGMOIDOSCOPY;  Surgeon: Daneil Dolin, MD;  Location[facility];  Service: Endoscopy;  Laterality: N/A;  . LAPAROSCOPIC CHOLECYSTECTOMY  03/09/2018  . PERITONEAL CATHETER INSERTION  2017  . POLYPECTOMY  10/24/2018   Procedure: POLYPECTOMY;  Surgeon: Daneil Dolin,  MD;  Location[facility];  Service: Endoscopy;;  . TOTAL HIP ARTHROPLASTY Left 1997     OB History    Gravida  2   Para  2   Term      Preterm      AB      Living  2     SAB      TAB      Ectopic      Multiple      Live Births              Family History  Problem Relation Age of Onset  . Heart disease Mother   . Thrombocytopenia Mother        TTP  . Heart failure Father   . Kidney disease Paternal Grandfather   . Colon cancer Neg Hx     Social History   Tobacco Use  . Smoking status: Passive Smoke Exposure - Never Smoker  . Smokeless tobacco: Never Used  Substance Use Topics  . Alcohol use: Never  . Drug use: Never    Home Medications Prior to Admission medications   Medication Sig Start Date End Date Taking? Authorizing Provider  ALPRAZolam Duanne Moron) 0.5 MG tablet Take 0.5 mg by mouth at bedtime as needed for anxiety.    [provider]  amLODipine (NORVASC) 10 MG tablet Take 1 tablet (10 mg total) by mouth daily. 11/09/17   Roxan Hockey, MD  ascorbic acid (VITAMIN C) 500 MG tablet Take 1 tablet (500 mg total) by mouth daily. 03/31/19   Oswald Hillock, MD  aspirin EC 81 MG tablet Take 81 mg by mouth daily.    [provider]  Blood Glucose Monitoring Suppl (ACCU-CHEK GUIDE) w/Device KIT 1 Piece by Does not apply route as directed. Patient not taking: Reported on 03/26/2019 02/28/19   Cassandria Anger, MD  calcitRIOL (ROCALTROL) 0.5 MCG capsule Take 0.5-1 mcg by mouth See admin instructions. 0.73mg on Mondays through Fridays and take 136m on Saturdays and Sundays    [provider]  Crisaborole (EUCRISA) 2 % OINT Apply 1 application topically daily as needed (for rash/irritation).    [provider]  dexamethasone (DECADRON) 6 MG tablet Take 1 tablet (6 mg total) by mouth daily. 03/30/19   LaOswald HillockMD  doxazosin (CARDURA) 8 MG tablet Take 8 mg by mouth at bedtime.    [provider]  glucose blood  (ACCU-CHEK GUIDE) test strip Use as instructed Patient not taking: Reported on 03/26/2019 02/28/19   NiCassandria AngerMD  Hyoscyamine Sulfate SL (LEVSIN/SL) 0.125 MG SUBL Place 0.125 mg under the tongue 3 (three) times daily as needed (diarrhea). Patient not taking: Reported on 03/26/2019 03/19/19   GiCarlis StableNP  Insulin Glargine, 2 Unit Dial, 300 UNIT/ML SOPN Inject 20 Units into the skin at bedtime. 02/28/19   NiCassandria AngerMD  Ipratropium-Albuterol (COMBIVENT) 20-100 MCG/ACT AERS respimat Inhale 2 puffs  into the lungs every 6 (six) hours as needed for wheezing or shortness of breath. 03/30/19   Oswald Hillock, MD  labetalol (NORMODYNE) 200 MG tablet Take 200 mg by mouth 2 (two) times daily.    [provider]  losartan (COZAAR) 100 MG tablet Take 100 mg by mouth daily.    [provider]  zinc sulfate 220 (50 Zn) MG capsule Take 1 capsule (220 mg total) by mouth daily. 03/31/19   Oswald Hillock, MD    Allergies    Patient has no known allergies.  Review of Systems   Review of Systems  All other systems reviewed and are negative.   Physical Exam Updated Vital Signs BP 112/75   Pulse 65   Resp 17   Ht _0  (1.727 m)   Wt 103.9 kg   SpO2 97%   BMI 34.83 kg/m   Physical Exam Vitals and nursing note reviewed.  Constitutional:      Appearance: Normal appearance.  HENT:     Head: Normocephalic and atraumatic.     Nose: Nose normal.     Mouth/Throat:     Mouth: Mucous membranes are moist.  Eyes:     Extraocular Movements: Extraocular movements intact.     Conjunctiva/sclera: Conjunctivae normal.     Pupils: Pupils are equal, round, and reactive to light.  Cardiovascular:     Rate and Rhythm: Normal rate and regular rhythm.  Pulmonary:     Effort: Pulmonary effort is normal. No respiratory distress.     Breath sounds: Normal breath sounds.  Musculoskeletal:        General: Normal range of motion.     Cervical back: Normal range of motion.      Comments: Patient was laying on her side, when asked to lay flat on her back she bent her knees and used her legs to help herself roll over to her back.  Skin:    General: Skin is warm and dry.  Neurological:     General: No focal deficit present.     Mental Status: She is alert and oriented to person, place, and time.     Cranial Nerves: No cranial nerve deficit.     Comments: Patient's grips are weak but equal bilaterally, there is no pronator drift, patellar reflexes are 1+ and equal.  She is able to straight leg raise both legs.  Patient speaks softly and acts like she cannot speak where she can be heard, but there is no inappropriate words, slurring of words.     ED Results / Procedures / Treatments   Labs (all labs ordered are listed, but only abnormal results are displayed) CBC    Component Value Date/Time   WBC 6.7 03/31/2019 2112   RBC 5.60 (H) 03/31/2019 2112   HGB 12.6 03/31/2019 2112   HCT 38.2 03/31/2019 2112   PLT 287 03/31/2019 2112   MCV 68.2 (L) 03/31/2019 2112   MCH 22.5 (L) 03/31/2019 2112   MCHC 33.0 03/31/2019 2112   RDW 20.4 (H) 03/31/2019 2112   LYMPHSABS 0.5 (L) 03/30/2019 0405   MONOABS 0.3 03/30/2019 0405   EOSABS 0.0 03/30/2019 0405   BASOSABS 0.0 03/30/2019 0405   BMET    Component Value Date/Time   NA 126 (L) 03/31/2019 2112   K 3.9 03/31/2019 2112   CL 85 (L) 03/31/2019 2112   CO2 20 (L) 03/31/2019 2112   GLUCOSE 292 (H) 03/31/2019 2112   BUN 112 (H) 03/31/2019 2112  CREATININE 12.32 (H) 03/31/2019 2112   CALCIUM 7.6 (L) 03/31/2019 2112   GFRNONAA 3 (L) 03/31/2019 2112   GFRAA 4 (L) 03/31/2019 2112   I stat BHCG 5.5 (8.8 on 9/18)  Laboratory interpretation all normal except stable hyponatremia, hyperglycemia, persistently elevated anion gap, chronic renal failure, however potassium is normal, low indices on her CBC    EKG None  Radiology CT Head Wo Contrast  Result Date: 04/01/2019 CLINICAL DATA:  Neuro deficit(s), subacute recent  hospitalization and discharge for COVID. Persistent nausea, vomiting, and weakness. EXAM: CT HEAD WITHOUT CONTRAST TECHNIQUE: Contiguous axial images were obtained from the base of the skull through the vertex without intravenous contrast. COMPARISON:  Brain MRI 01/11/2019 FINDINGS: Brain: No intracranial hemorrhage, mass effect, or midline shift. No hydrocephalus. The basilar cisterns are patent. The small remote left occipital infarct on MRI is not well seen by CT. No evidence of territorial infarct or acute ischemia. No extra-axial or intracranial fluid collection. Vascular: Atherosclerosis of skullbase vasculature without hyperdense vessel or abnormal calcification. Skull: No fracture or focal lesion. Sinuses/Orbits: Paranasal sinuses and mastoid air cells are clear. The visualized orbits are unremarkable. Other: None. IMPRESSION: No acute intracranial abnormality. Electronically Signed   By: Keith Rake M.D.   On: 04/01/2019 04:33    Procedures Procedures (including critical care time)  Medications Ordered in ED Medications  diphenhydrAMINE (BENADRYL) injection 25 mg (25 mg Intramuscular Given 04/01/19 0349)  metoCLOPramide (REGLAN) injection 10 mg (10 mg Intramuscular Given 04/01/19 0350)    ED Course  I have reviewed the triage vital signs and the nursing notes.  Pertinent labs & imaging results that were available during my care of the patient were reviewed by me and considered in my medical decision making (see chart for details).    MDM Rules/Calculators/A&P                      It appears patient went to [facility] ED around 9 PM for same symptoms.  She had blood work done at that time and a head CT ordered however she left without getting it done.  Patient is extremely hard to get IVs historically and tonight.  Nursing staff attempted without success, her medications were changed to IM.  She was sent for head CT which did not show anything acute.  When I review her prior  radiology studies because she states she has had strokes before, she had an MRI on January 11, 2019 with a complaint of left-sided numbness and facial drooping for 2 weeks.  That impression was #1 motion graded examination #2 no evidence of acute intracranial abnormality including acute or recent subacute infarction, #3 probable small chronic subcortical infarct within the left occipital lobe, #4 chronic lacunar infarct within the right caudate.  I could not find any obvious neurology consults.  5:03 AM I talked to the patient she states her headache is gone.  We discussed her CT result.  I have not noted any slurred speech she just speaks very softly and acts like it is difficult to speak.  However she does have a history of stroke, we discussed doing the MRI this morning and she is agreeable.  MRI was ordered, if it does not show anything acute she can be discharged home with diagnosis of complex migraine.  5:20 AM patient told nursing staff she changed her mind and would prefer to go home without getting the MRI.  Final Clinical Impression(s) / ED Diagnoses Final diagnoses:  Migraine without aura and without status migrainosus, not intractable  Weakness    Rx / DC Orders ED Discharge Orders    None     Plan discharge  Rolland Porter, MD, Barbette Or, MD 04/01/19 [phone]

## 2019-04-02 ENCOUNTER — Emergency Department (HOSPITAL_COMMUNITY)
Admission: EM | Admit: 2019-04-02 | Discharge: 2019-04-02 | Disposition: A | Discharge status: Home / Self Care | Payer: Medicare Other | Attending: Emergency Medicine | Admitting: Emergency Medicine

## 2019-04-02 ENCOUNTER — Encounter (HOSPITAL_COMMUNITY): Payer: Self-pay | Admitting: Emergency Medicine

## 2019-04-02 ENCOUNTER — Emergency Department (HOSPITAL_COMMUNITY): Payer: Medicare Other

## 2019-04-02 ENCOUNTER — Other Ambulatory Visit: Payer: Self-pay

## 2019-04-02 DIAGNOSIS — R4781 Slurred speech: Secondary | ICD-10-CM | POA: Diagnosis not present

## 2019-04-02 DIAGNOSIS — Z7982 Long term (current) use of aspirin: Secondary | ICD-10-CM | POA: Diagnosis not present

## 2019-04-02 DIAGNOSIS — E1122 Type 2 diabetes mellitus with diabetic chronic kidney disease: Secondary | ICD-10-CM | POA: Diagnosis not present

## 2019-04-02 DIAGNOSIS — I132 Hypertensive heart and chronic kidney disease with heart failure and with stage 5 chronic kidney disease, or end stage renal disease: Secondary | ICD-10-CM | POA: Insufficient documentation

## 2019-04-02 DIAGNOSIS — Z79899 Other long term (current) drug therapy: Secondary | ICD-10-CM | POA: Insufficient documentation

## 2019-04-02 DIAGNOSIS — R531 Weakness: Secondary | ICD-10-CM | POA: Diagnosis not present

## 2019-04-02 DIAGNOSIS — N186 End stage renal disease: Secondary | ICD-10-CM | POA: Insufficient documentation

## 2019-04-02 DIAGNOSIS — U071 COVID-19: Secondary | ICD-10-CM | POA: Diagnosis not present

## 2019-04-02 DIAGNOSIS — Z794 Long term (current) use of insulin: Secondary | ICD-10-CM | POA: Diagnosis not present

## 2019-04-02 DIAGNOSIS — I509 Heart failure, unspecified: Secondary | ICD-10-CM | POA: Diagnosis not present

## 2019-04-02 DIAGNOSIS — J189 Pneumonia, unspecified organism: Secondary | ICD-10-CM | POA: Diagnosis not present

## 2019-04-02 DIAGNOSIS — Z7722 Contact with and (suspected) exposure to environmental tobacco smoke (acute) (chronic): Secondary | ICD-10-CM | POA: Diagnosis not present

## 2019-04-02 DIAGNOSIS — R0689 Other abnormalities of breathing: Secondary | ICD-10-CM | POA: Diagnosis not present

## 2019-04-02 DIAGNOSIS — G459 Transient cerebral ischemic attack, unspecified: Secondary | ICD-10-CM | POA: Diagnosis not present

## 2019-04-02 DIAGNOSIS — Z96649 Presence of unspecified artificial hip joint: Secondary | ICD-10-CM | POA: Insufficient documentation

## 2019-04-02 DIAGNOSIS — R918 Other nonspecific abnormal finding of lung field: Secondary | ICD-10-CM | POA: Diagnosis not present

## 2019-04-02 DIAGNOSIS — R519 Headache, unspecified: Secondary | ICD-10-CM | POA: Diagnosis not present

## 2019-04-02 LAB — CBC WITH DIFFERENTIAL/PLATELET
Abs Immature Granulocytes: 0.23 10*3/uL — ABNORMAL HIGH (ref 0.00–0.07)
Basophils Absolute: 0 10*3/uL (ref 0.0–0.1)
Basophils Relative: 0 %
Eosinophils Absolute: 0.1 10*3/uL (ref 0.0–0.5)
Eosinophils Relative: 1 %
HCT: 36.1 % (ref 36.0–46.0)
Hemoglobin: 11.7 g/dL — ABNORMAL LOW (ref 12.0–15.0)
Immature Granulocytes: 3 %
Lymphocytes Relative: 11 %
Lymphs Abs: 0.9 10*3/uL (ref 0.7–4.0)
MCH: 22.1 pg — ABNORMAL LOW (ref 26.0–34.0)
MCHC: 32.4 g/dL (ref 30.0–36.0)
MCV: 68.2 fL — ABNORMAL LOW (ref 80.0–100.0)
Monocytes Absolute: 0.9 10*3/uL (ref 0.1–1.0)
Monocytes Relative: 11 %
Neutro Abs: 6.2 10*3/uL (ref 1.7–7.7)
Neutrophils Relative %: 74 %
Platelets: 258 10*3/uL (ref 150–400)
RBC: 5.29 MIL/uL — ABNORMAL HIGH (ref 3.87–5.11)
RDW: 20.2 % — ABNORMAL HIGH (ref 11.5–15.5)
WBC: 8.3 10*3/uL (ref 4.0–10.5)
nRBC: 0 % (ref 0.0–0.2)

## 2019-04-02 LAB — COMPREHENSIVE METABOLIC PANEL
ALT: 9 U/L (ref 0–44)
AST: 12 U/L — ABNORMAL LOW (ref 15–41)
Albumin: 2.2 g/dL — ABNORMAL LOW (ref 3.5–5.0)
Alkaline Phosphatase: 78 U/L (ref 38–126)
Anion gap: >20 — ABNORMAL HIGH (ref 5–15)
BUN: 132 mg/dL — ABNORMAL HIGH (ref 6–20)
CO2: 19 mmol/L — ABNORMAL LOW (ref 22–32)
Calcium: 7.5 mg/dL — ABNORMAL LOW (ref 8.9–10.3)
Chloride: 86 mmol/L — ABNORMAL LOW (ref 98–111)
Creatinine, Ser: 14.54 mg/dL — ABNORMAL HIGH (ref 0.44–1.00)
GFR calc Af Amer: 3 mL/min — ABNORMAL LOW (ref >60)
GFR calc non Af Amer: 3 mL/min — ABNORMAL LOW (ref >60)
Glucose, Bld: 209 mg/dL — ABNORMAL HIGH (ref 70–99)
Potassium: 3.4 mmol/L — ABNORMAL LOW (ref 3.5–5.1)
Sodium: 127 mmol/L — ABNORMAL LOW (ref 135–145)
Total Bilirubin: 0.5 mg/dL (ref 0.3–1.2)
Total Protein: 6.3 g/dL — ABNORMAL LOW (ref 6.5–8.1)

## 2019-04-02 LAB — CBG MONITORING, ED: Glucose-Capillary: 200 mg/dL — ABNORMAL HIGH (ref 70–99)

## 2019-04-02 IMAGING — MR MR HEAD W/O CM
6 series · 45 of 48 positions shown · non-contrast
Comparison: [DATE]

CLINICAL DATA: Slurred speech, weakness, headache

EXAM:
MRI HEAD WITHOUT CONTRAST
TECHNIQUE: Multiplanar, multiecho pulse sequences of the brain and surrounding
structures were obtained without intravenous contrast.

[Series 2: T1 · sagittal · 5.0mm · 0.37mm/px · 2 of 20 slices shown]
[im 1/20]
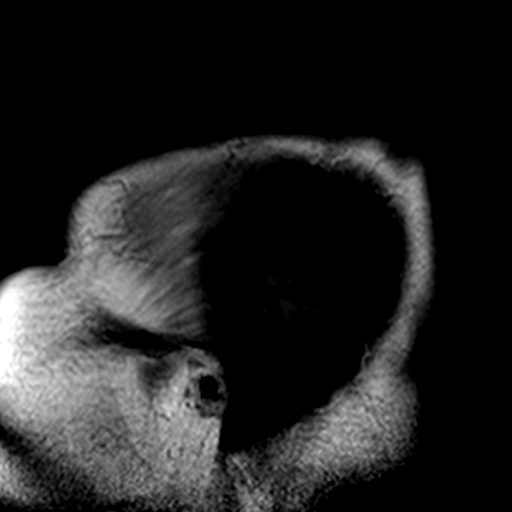
[im 20/20]
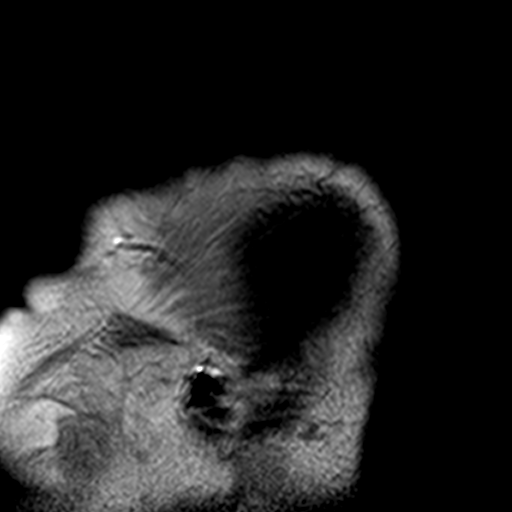

[Series 5: DWI · coronal · 5.0mm · 0.49mm/px · 11 of 68 slices shown (1 of 2)]
[im 1/68]
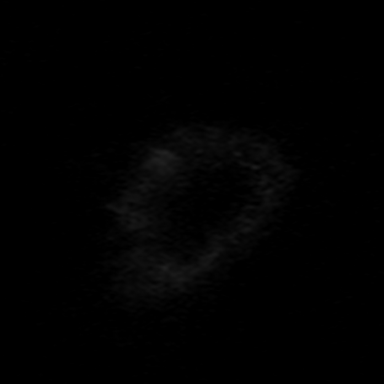
[im 7/68]
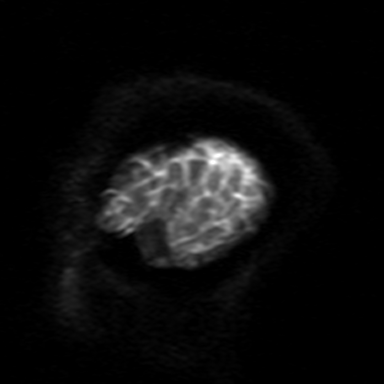
[im 14/68]
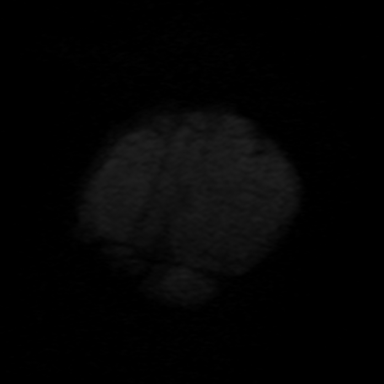
[im 21/68]
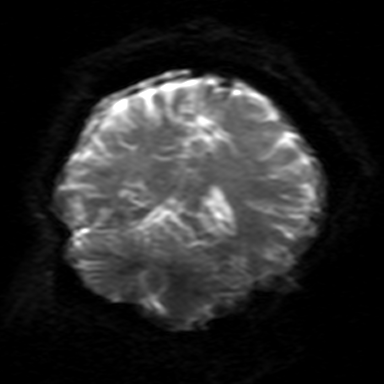
[im 27/68]
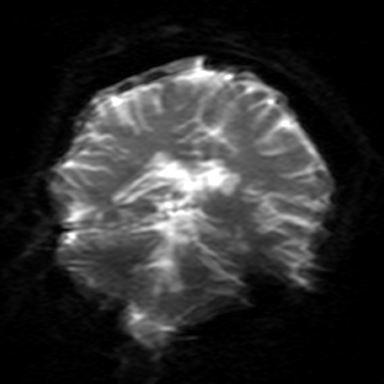
[im 34/68]
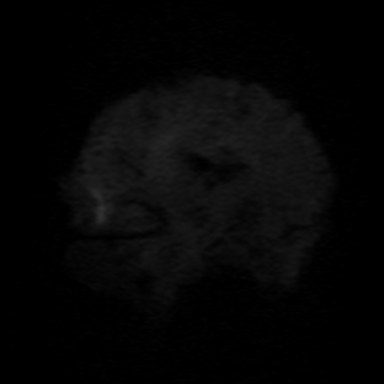
[im 41/68]
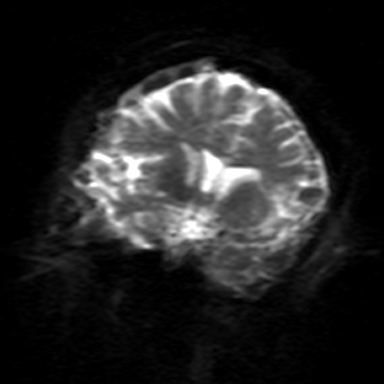
[im 47/68]
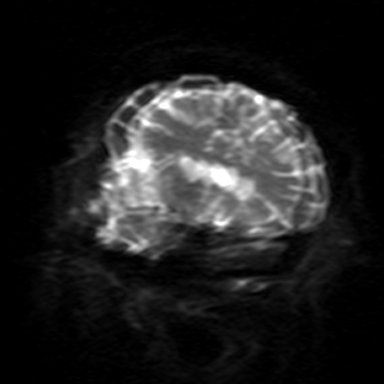
[im 54/68]
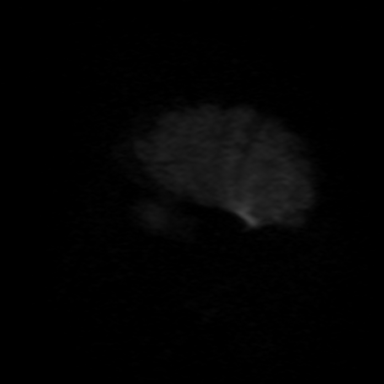
[im 61/68]
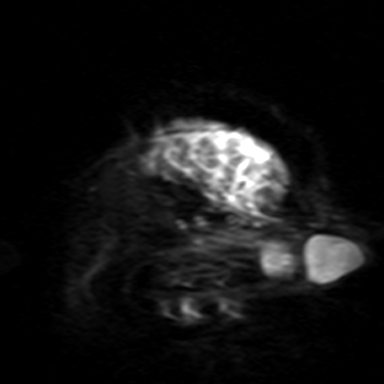
[im 68/68]
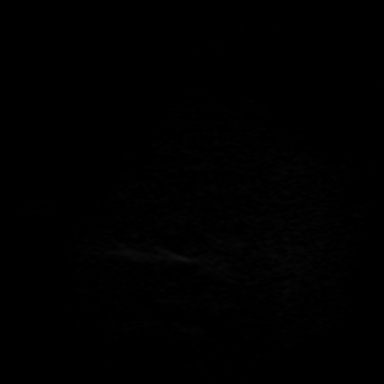

[Series 6: cor dwi_adc · coronal · 5.0mm · 0.49mm/px · 5 of 34 slices shown]
[im 1/34]
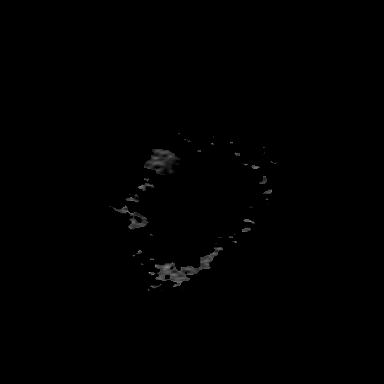
[im 9/34]
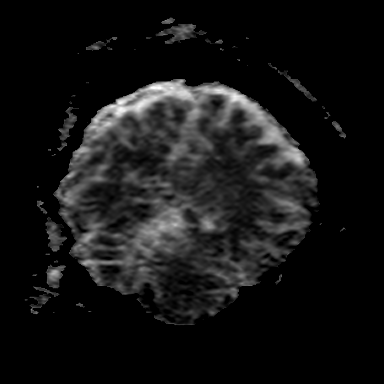
[im 17/34]
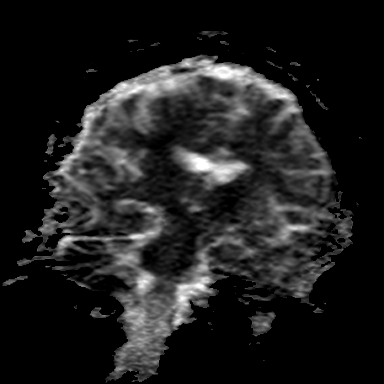
[im 25/34]
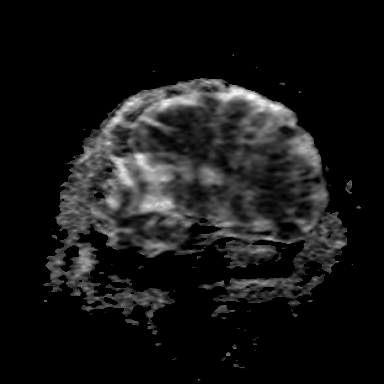
[im 34/34]
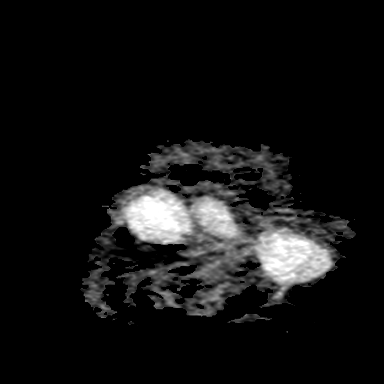

[Series 7: DWI · axial · 3.0mm · 0.73mm/px · z∈[-68,+77]mm · 15 of 107 slices shown (2 of 2)]
[im 1/107]
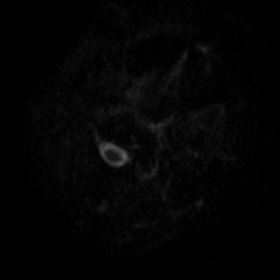
[im 7/107]
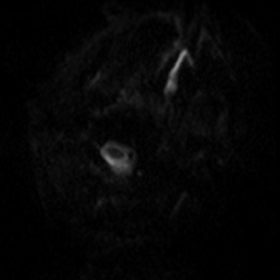
[im 14/107]
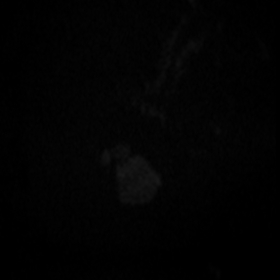
[im 20/107]
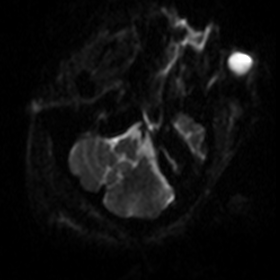
[im 27/107]
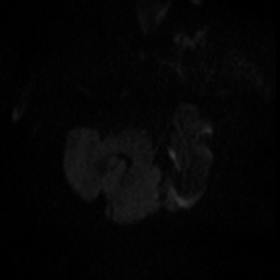
[im 34/107]
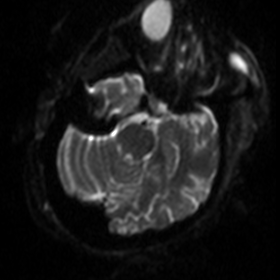
[im 40/107]
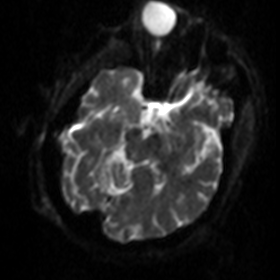
[im 47/107]
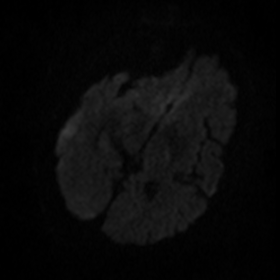
[im 54/107]
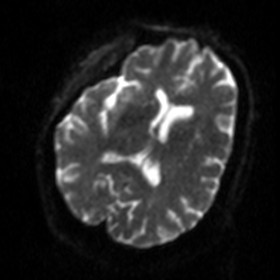
[im 60/107]
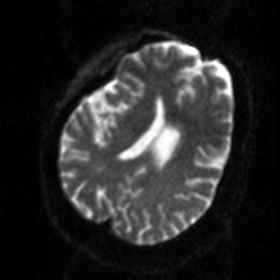
[im 67/107]
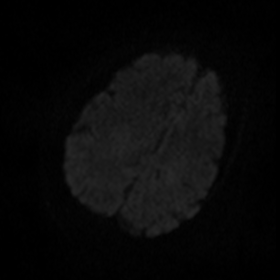
[im 73/107]
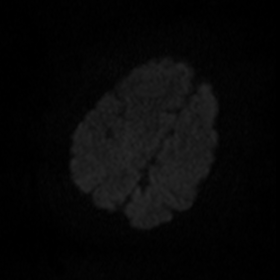
[im 87/107]
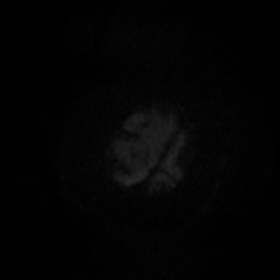
[im 93/107]
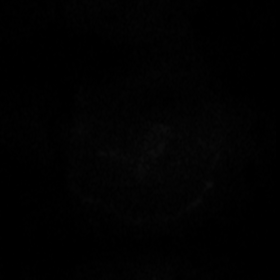
[im 100/107]
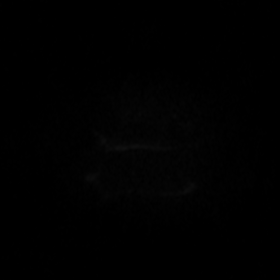

[Series 8: ax dwi_adc · axial · 3.0mm · 0.73mm/px · z∈[-68,+89]mm · 8 of 55 slices shown]
[im 1/55]
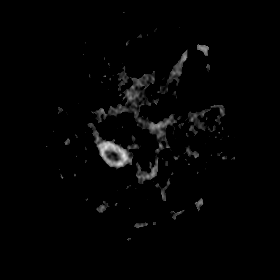
[im 7/55]
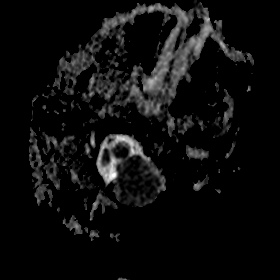
[im 14/55]
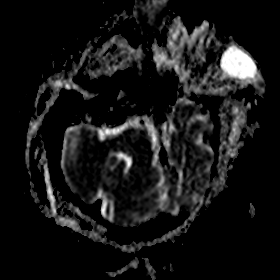
[im 21/55]
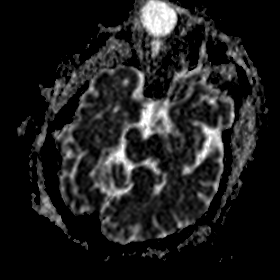
[im 34/55]
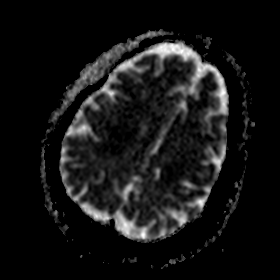
[im 41/55]
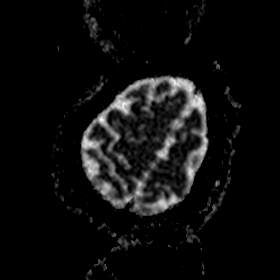
[im 48/55]
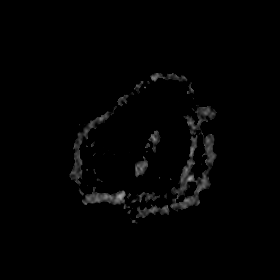
[im 55/55]
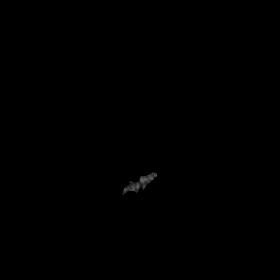

[Series 9: T2 · axial · 5.0mm · 0.64mm/px · z∈[-57,+82]mm · 4 of 23 slices shown]
[im 1/23]
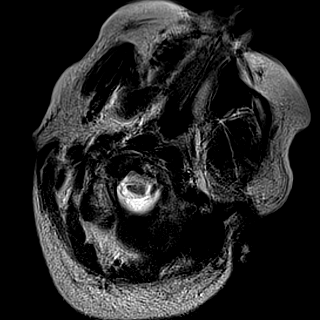
[im 8/23]
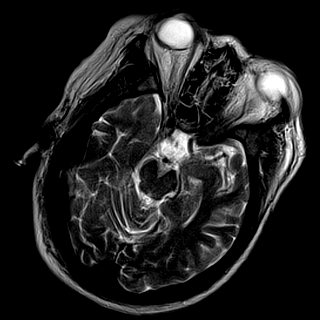
[im 15/23]
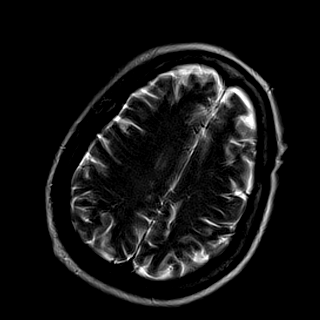
[im 23/23]
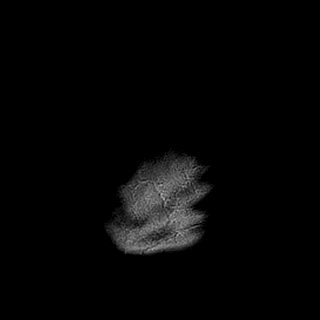

[45 of 48 positions shown; findings below may reference images not displayed]

FINDINGS: Motion artifact is present. Sagittal T1, axial and coronal DWI, and
axial T2 sequences were obtained. Patient could not tolerate
remainder of study. Findings below are within this limitation

Brain: There is no acute infarction. There is no intracranial mass,
mass effect, or edema.

There is no hydrocephalus or extra-axial fluid collection.

Vascular: Major vessel flow voids at the skull base are preserved.

Skull and upper cervical spine: Normal marrow signal is preserved.

Sinuses/Orbits: Mild mucosal thickening. Orbits are grossly
unremarkable.

Other: Sella is unremarkable.  Mastoid air cells are clear.
IMPRESSION: Partial, motion degraded study.  No acute infarction.

## 2019-04-02 IMAGING — DX DG CHEST 1V PORT
1 series · 1 of 1 positions shown · non-contrast
Comparison: [DATE]

CLINICAL DATA: Weakness.

EXAM:
PORTABLE CHEST 1 VIEW

[chest ap grid]
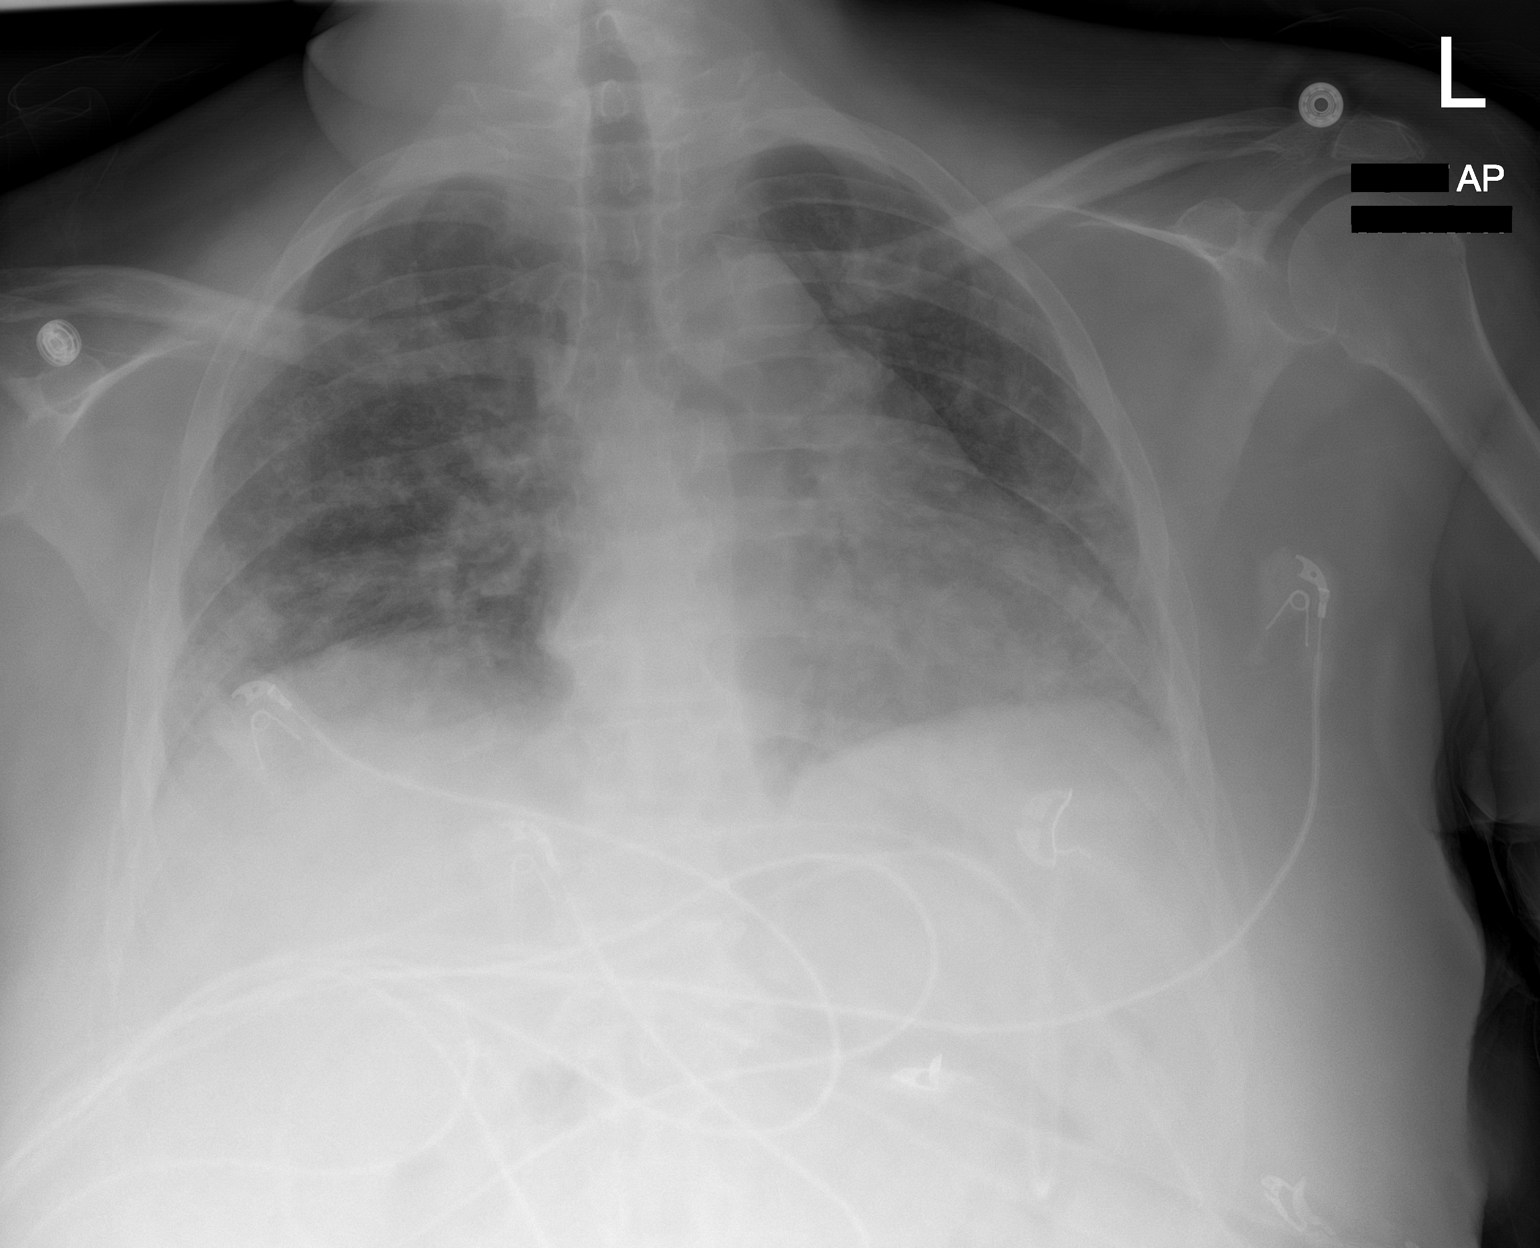

[1 of 1 positions shown; findings below may reference images not displayed]

FINDINGS: Low lung volumes are seen which is likely, in part, secondary to
suboptimal patient inspiration. There is subsequent vascular
crowding. Very mild hazy diffuse bilateral infiltrates are seen.
There is no evidence of a pleural effusion or pneumothorax. The
heart size and mediastinal contours are within normal limits.
Multilevel degenerative changes seen throughout the thoracic spine.
IMPRESSION: 1. Low lung volumes with very mild hazy diffuse bilateral
infiltrates.

## 2019-04-02 NOTE — ED Provider Notes (Signed)
[facility] Provider Note   CSN: [id number] Arrival date & time: 04/02/19  1631     History Chief Complaint  Patient presents with  . Aphasia    Janet Mitchell is a 54 y.o. female.  Patient has Covid and is a peritoneal dialysis patient who recently was admitted to the hospital.  Now for the last 24 hours she is complaining of mild slurred speech  The history is provided by the patient. No language interpreter was used.  Weakness Severity:  Mild Onset quality:  Gradual Timing:  Constant Progression:  Waxing and waning Chronicity:  New Context: alcohol use   Relieved by:  Nothing Worsened by:  Nothing Ineffective treatments:  None tried Associated symptoms: no abdominal pain, no chest pain, no cough, no diarrhea, no frequency, no headaches and no seizures        Past Medical History:  Diagnosis Date  . Anemia   . CHF (congestive heart failure) ([place])   . Chronic cholecystitis with calculus   . ESRD (end stage renal disease) on dialysis [facility])    "peritoneal dialysis q hs" (03/09/2018)  . Headache    "a few/wk" (03/09/2018)  . History of blood transfusion 10/2017   "low blood count" (03/09/2018)  . Hypertension   . Spinal headache   . Type II diabetes mellitus [facility])     Patient Active Problem List   Diagnosis Date Noted  . COVID-19 03/26/2019  . Acute gastroenteritis 03/25/2019  . Type II diabetes mellitus ([place])   . Hypokalemia   . Hyponatremia   . Anemia in ESRD (end-stage renal disease) ([place])   . Essential hypertension, benign 02/14/2019  . Class 2 severe obesity due to excess calories with serious comorbidity and body mass index (BMI) of 37.0 to 37.9 in adult [facility]) 02/14/2019  . SBP (spontaneous bacterial peritonitis) ([place]) 12/18/2018  . Abdominal pain 11/28/2018  . Rectal burning 08/27/2018  . Rectal bleeding 08/27/2018  . Diarrhea 05/22/2018  . Fluid overload 03/25/2018  . Microcytic anemia 03/25/2018  . Chronic cholecystitis  with calculus 03/09/2018  . Type 2 diabetes mellitus with ESRD (end-stage renal disease) ([place]) 03/09/2018  . Pre-transplant evaluation for ESRD (end stage renal disease) 12/19/2017  . Symptomatic anemia 11/06/2017  . Hypertension 11/06/2017  . ESRD on peritoneal dialysis ([place]) 11/06/2017  . Diabetic macular edema ([place]) 04/10/2015  . Edema of lower extremity 04/10/2015  . Uncontrolled type 2 diabetes mellitus ([place]) 04/10/2015    Past Surgical History:  Procedure Laterality Date  . AMPUTATION TOE Left 2013   Great toe  . BIOPSY  10/24/2018   Procedure: BIOPSY;  Surgeon: Daneil Dolin, MD;  [facility];  Service: Endoscopy;;  right and left colon  . CATARACT EXTRACTION W/ INTRAOCULAR LENS IMPLANT Right   . CESAREAN SECTION  1994; 1998  . CHOLECYSTECTOMY N/A 03/09/2018   Procedure: LAPAROSCOPIC CHOLECYSTECTOMY WITH INTRAOPERATIVE CHOLANGIOGRAM ERAS PATHWAY;  Surgeon: Donnie Mesa, MD;  Location: [facility];  Service: General;  Laterality: N/A;  . COLONOSCOPY N/A 10/24/2018   Procedure: COLONOSCOPY;  Surgeon: Daneil Dolin, MD;  [facility];  Service: Endoscopy;  Laterality: N/A;  1:00pm  . EYE SURGERY  05/15/2018   Removal of blood in the globe (due to DM)  . FLEXIBLE SIGMOIDOSCOPY N/A 11/24/2017   Procedure: FLEXIBLE SIGMOIDOSCOPY;  Surgeon: Daneil Dolin, MD;  [facility];  Service: Endoscopy;  Laterality: N/A;  . LAPAROSCOPIC CHOLECYSTECTOMY  03/09/2018  . PERITONEAL CATHETER INSERTION  2017  . POLYPECTOMY  10/24/2018   Procedure: POLYPECTOMY;  Surgeon: Daneil Dolin, MD;  [facility];  Service: Endoscopy;;  . TOTAL HIP ARTHROPLASTY Left 1997     OB History    Gravida  2   Para  2   Term      Preterm      AB      Living  2     SAB      TAB      Ectopic      Multiple      Live Births              Family History  Problem Relation Age of Onset  . Heart disease Mother   . Thrombocytopenia Mother        TTP    . Heart failure Father   . Kidney disease Paternal Grandfather   . Colon cancer Neg Hx     Social History   Tobacco Use  . Smoking status: Passive Smoke Exposure - Never Smoker  . Smokeless tobacco: Never Used  Substance Use Topics  . Alcohol use: Never  . Drug use: Never    Home Medications Prior to Admission medications   Medication Sig Start Date End Date Taking? Authorizing Provider  ALPRAZolam Duanne Moron) 0.5 MG tablet Take 0.5 mg by mouth at bedtime as needed for anxiety.   Yes [provider]  amLODipine (NORVASC) 10 MG tablet Take 1 tablet (10 mg total) by mouth daily. 11/09/17  Yes Emokpae, Courage, MD  ascorbic acid (VITAMIN C) 500 MG tablet Take 1 tablet (500 mg total) by mouth daily. 03/31/19  Yes Oswald Hillock, MD  aspirin EC 81 MG tablet Take 81 mg by mouth daily.   Yes [provider]  calcitRIOL (ROCALTROL) 0.5 MCG capsule Take 0.5-1 mcg by mouth See admin instructions. 0.76mg on Mondays through Fridays and take 158m on Saturdays and Sundays   Yes [provider]  Crisaborole (EUCRISA) 2 % OINT Apply 1 application topically daily as needed (for rash/irritation).   Yes [provider]  dexamethasone (DECADRON) 6 MG tablet Take 1 tablet (6 mg total) by mouth daily. 03/30/19  Yes LaOswald HillockMD  doxazosin (CARDURA) 8 MG tablet Take 8 mg by mouth at bedtime.   Yes [provider]  glucose blood (ACCU-CHEK GUIDE) test strip Use as instructed 02/28/19  Yes Nida, GeMarella ChimesMD  Hyoscyamine Sulfate SL (LEVSIN/SL) 0.125 MG SUBL Place 0.125 mg under the tongue 3 (three) times daily as needed (diarrhea). 03/19/19  Yes GiCarlis StableNP  Insulin Glargine, 2 Unit Dial, 300 UNIT/ML SOPN Inject 20 Units into the skin at bedtime. 02/28/19  Yes Nida, GeMarella ChimesMD  Ipratropium-Albuterol (COMBIVENT) 20-100 MCG/ACT AERS respimat Inhale 2 puffs into the lungs every 6 (six) hours as needed for wheezing or shortness of breath. 03/30/19  Yes LaOswald HillockMD  labetalol (NORMODYNE) 200 MG tablet Take 200 mg by mouth 2 (two) times daily.   Yes [provider]  losartan (COZAAR) 100 MG tablet Take 100 mg by mouth daily.   Yes [provider]  zinc sulfate 220 (50 Zn) MG capsule Take 1 capsule (220 mg total) by mouth daily. 03/31/19  Yes LaOswald HillockMD  Blood Glucose Monitoring Suppl (ACCU-CHEK GUIDE) w/Device KIT 1 Piece by Does not apply route as directed. Patient not taking: Reported on 03/26/2019 02/28/19   NiCassandria AngerMD  Allergies    Patient has no known allergies.  Review of Systems   Review of Systems  Constitutional: Negative for appetite change and fatigue.  HENT: Negative for congestion, ear discharge and sinus pressure.   Eyes: Negative for discharge.  Respiratory: Negative for cough.   Cardiovascular: Negative for chest pain.  Gastrointestinal: Negative for abdominal pain and diarrhea.  Genitourinary: Negative for frequency and hematuria.  Musculoskeletal: Negative for back pain.  Skin: Negative for rash.  Neurological: Positive for weakness. Negative for seizures and headaches.       Slurred speech  Psychiatric/Behavioral: Negative for hallucinations.    Physical Exam Updated Vital Signs BP (!) 156/80 (BP Location: Left Wrist)   Pulse 71   Temp 98.1 F (36.7 C) (Oral)   Resp 14   Ht '5\' 8"'  (1.727 m)   Wt 103.9 kg   SpO2 94%   BMI 34.83 kg/m   Physical Exam Vitals and nursing note reviewed.  Constitutional:      Appearance: She is well-developed.  HENT:     Head: Normocephalic.     Right Ear: External ear normal.     Nose: Nose normal.  Eyes:     General: No scleral icterus.    Conjunctiva/sclera: Conjunctivae normal.  Neck:     Thyroid: No thyromegaly.  Cardiovascular:     Rate and Rhythm: Normal rate and regular rhythm.     Heart sounds: No murmur. No friction rub. No gallop.   Pulmonary:     Breath sounds: No stridor. No wheezing or rales.  Chest:     Chest  wall: No tenderness.  Abdominal:     General: There is no distension.     Tenderness: There is no abdominal tenderness. There is no rebound.  Musculoskeletal:        General: Normal range of motion.     Cervical back: Neck supple.  Lymphadenopathy:     Cervical: No cervical adenopathy.  Skin:    Findings: No erythema or rash.  Neurological:     Mental Status: She is alert.     Motor: No abnormal muscle tone.     Coordination: Coordination normal.     Comments: Mild lethargy and slurred speech  Psychiatric:        Behavior: Behavior normal.     ED Results / Procedures / Treatments   Labs (all labs ordered are listed, but only abnormal results are displayed) Labs Reviewed  CBC WITH DIFFERENTIAL/PLATELET - Abnormal; Notable for the following components:      Result Value   RBC 5.29 (*)    Hemoglobin 11.7 (*)    MCV 68.2 (*)    MCH 22.1 (*)    RDW 20.2 (*)    Abs Immature Granulocytes 0.23 (*)    All other components within normal limits  COMPREHENSIVE METABOLIC PANEL - Abnormal; Notable for the following components:   Sodium 127 (*)    Potassium 3.4 (*)    Chloride 86 (*)    CO2 19 (*)    Glucose, Bld 209 (*)    BUN 132 (*)    Creatinine, Ser 14.54 (*)    Calcium 7.5 (*)    Total Protein 6.3 (*)    Albumin 2.2 (*)    AST 12 (*)    GFR calc non Af Amer 3 (*)    GFR calc Af Amer 3 (*)    Anion gap >20 (*)    All other components within normal limits  CBG MONITORING,  ED - Abnormal; Notable for the following components:   Glucose-Capillary 200 (*)    All other components within normal limits    EKG None  Radiology CT Head Wo Contrast  Result Date: 04/01/2019 CLINICAL DATA:  Neuro deficit(s), subacute recent hospitalization and discharge for COVID. Persistent nausea, vomiting, and weakness. EXAM: CT HEAD WITHOUT CONTRAST TECHNIQUE: Contiguous axial images were obtained from the base of the skull through the vertex without intravenous contrast. COMPARISON:  Brain  MRI 01/11/2019 FINDINGS: Brain: No intracranial hemorrhage, mass effect, or midline shift. No hydrocephalus. The basilar cisterns are patent. The small remote left occipital infarct on MRI is not well seen by CT. No evidence of territorial infarct or acute ischemia. No extra-axial or intracranial fluid collection. Vascular: Atherosclerosis of skullbase vasculature without hyperdense vessel or abnormal calcification. Skull: No fracture or focal lesion. Sinuses/Orbits: Paranasal sinuses and mastoid air cells are clear. The visualized orbits are unremarkable. Other: None. IMPRESSION: No acute intracranial abnormality. Electronically Signed   By: Keith Rake M.D.   On: 04/01/2019 04:33   MR BRAIN WO CONTRAST  Result Date: 04/02/2019 CLINICAL DATA:  Slurred speech, weakness, headache EXAM: MRI HEAD WITHOUT CONTRAST TECHNIQUE: Multiplanar, multiecho pulse sequences of the brain and surrounding structures were obtained without intravenous contrast. COMPARISON:  01/11/2019 FINDINGS: Motion artifact is present. Sagittal T1, axial and coronal DWI, and axial T2 sequences were obtained. Patient could not tolerate remainder of study. Findings below are within this limitation Brain: There is no acute infarction. There is no intracranial mass, mass effect, or edema. There is no hydrocephalus or extra-axial fluid collection. Vascular: Major vessel flow voids at the skull base are preserved. Skull and upper cervical spine: Normal marrow signal is preserved. Sinuses/Orbits: Mild mucosal thickening. Orbits are grossly unremarkable. Other: Sella is unremarkable.  Mastoid air cells are clear. IMPRESSION: Partial, motion degraded study.  No acute infarction. Electronically Signed   By: Macy Mis M.D.   On: 04/02/2019 18:21   DG Chest Portable 1 View  Result Date: 04/02/2019 CLINICAL DATA:  Weakness. EXAM: PORTABLE CHEST 1 VIEW COMPARISON:  Jul 31, 2018 FINDINGS: Low lung volumes are seen which is likely, in part, secondary  to suboptimal patient inspiration. There is subsequent vascular crowding. Very mild hazy diffuse bilateral infiltrates are seen. There is no evidence of a pleural effusion or pneumothorax. The heart size and mediastinal contours are within normal limits. Multilevel degenerative changes seen throughout the thoracic spine. IMPRESSION: 1. Low lung volumes with very mild hazy diffuse bilateral infiltrates. Electronically Signed   By: Virgina Norfolk M.D.   On: 04/02/2019 18:18    Procedures Procedures (including critical care time)  Medications Ordered in ED Medications - No data to display  ED Course  I have reviewed the triage vital signs and the nursing notes.  Pertinent labs & imaging results that were available during my care of the patient were reviewed by me and considered in my medical decision making (see chart for details).    MDM Rules/Calculators/A&P                     MRI of the brain negative.  Labs unremarkable except for kidney disease.  On second exam patient was alert and oriented no more slurred speech.  She will be discharged home to follow-up with her doctor Final Clinical Impression(s) / ED Diagnoses Final diagnoses:  Slurred speech    Rx / DC Orders ED Discharge Orders    None  Milton Ferguson, MD 04/02/19 1911

## 2019-04-02 NOTE — Discharge Instructions (Addendum)
Follow-up with your doctor as planned and continue your peritoneal dialysis

## 2019-04-02 NOTE — ED Triage Notes (Signed)
PT brought in by [facility] from home with her husband for c/o continued slurred speech, generalized weakness and headache x5 days. EMS reports pt was hospitalized x5 days ago and released to come home on  03/30/2019. PT was dx with Covid on 03/26/19.

## 2019-04-03 ENCOUNTER — Inpatient Hospital Stay
Admission: EM | Admit: 2019-04-03 | Discharge: 2019-04-13 | DRG: 444 | Disposition: A | Discharge status: Home Health | Payer: Medicare Other | Attending: Internal Medicine | Admitting: Internal Medicine

## 2019-04-03 ENCOUNTER — Other Ambulatory Visit: Payer: Self-pay

## 2019-04-03 DIAGNOSIS — R42 Dizziness and giddiness: Secondary | ICD-10-CM

## 2019-04-03 DIAGNOSIS — Z992 Dependence on renal dialysis: Secondary | ICD-10-CM

## 2019-04-03 DIAGNOSIS — J9601 Acute respiratory failure with hypoxia: Secondary | ICD-10-CM | POA: Diagnosis not present

## 2019-04-03 DIAGNOSIS — U071 COVID-19: Secondary | ICD-10-CM | POA: Diagnosis not present

## 2019-04-03 DIAGNOSIS — Z794 Long term (current) use of insulin: Secondary | ICD-10-CM

## 2019-04-03 DIAGNOSIS — I10 Essential (primary) hypertension: Secondary | ICD-10-CM

## 2019-04-03 DIAGNOSIS — I959 Hypotension, unspecified: Secondary | ICD-10-CM

## 2019-04-03 DIAGNOSIS — D62 Acute posthemorrhagic anemia: Secondary | ICD-10-CM | POA: Diagnosis present

## 2019-04-03 DIAGNOSIS — E1169 Type 2 diabetes mellitus with other specified complication: Secondary | ICD-10-CM | POA: Diagnosis present

## 2019-04-03 DIAGNOSIS — K921 Melena: Secondary | ICD-10-CM | POA: Diagnosis present

## 2019-04-03 DIAGNOSIS — D509 Iron deficiency anemia, unspecified: Secondary | ICD-10-CM | POA: Diagnosis present

## 2019-04-03 DIAGNOSIS — E875 Hyperkalemia: Secondary | ICD-10-CM

## 2019-04-03 DIAGNOSIS — E871 Hypo-osmolality and hyponatremia: Secondary | ICD-10-CM | POA: Diagnosis not present

## 2019-04-03 DIAGNOSIS — Z9049 Acquired absence of other specified parts of digestive tract: Secondary | ICD-10-CM

## 2019-04-03 DIAGNOSIS — I700 Atherosclerosis of aorta: Secondary | ICD-10-CM | POA: Diagnosis present

## 2019-04-03 DIAGNOSIS — Z89412 Acquired absence of left great toe: Secondary | ICD-10-CM

## 2019-04-03 DIAGNOSIS — Z8616 Personal history of COVID-19: Secondary | ICD-10-CM

## 2019-04-03 DIAGNOSIS — K529 Noninfective gastroenteritis and colitis, unspecified: Secondary | ICD-10-CM

## 2019-04-03 DIAGNOSIS — D631 Anemia in chronic kidney disease: Secondary | ICD-10-CM | POA: Diagnosis present

## 2019-04-03 DIAGNOSIS — Y92239 Unspecified place in hospital as the place of occurrence of the external cause: Secondary | ICD-10-CM | POA: Diagnosis not present

## 2019-04-03 DIAGNOSIS — N186 End stage renal disease: Secondary | ICD-10-CM | POA: Diagnosis not present

## 2019-04-03 DIAGNOSIS — R799 Abnormal finding of blood chemistry, unspecified: Secondary | ICD-10-CM

## 2019-04-03 DIAGNOSIS — Y838 Other surgical procedures as the cause of abnormal reaction of the patient, or of later complication, without mention of misadventure at the time of the procedure: Secondary | ICD-10-CM | POA: Diagnosis present

## 2019-04-03 DIAGNOSIS — T50995A Adverse effect of other drugs, medicaments and biological substances, initial encounter: Secondary | ICD-10-CM | POA: Diagnosis not present

## 2019-04-03 DIAGNOSIS — Z96642 Presence of left artificial hip joint: Secondary | ICD-10-CM | POA: Diagnosis present

## 2019-04-03 DIAGNOSIS — E8809 Other disorders of plasma-protein metabolism, not elsewhere classified: Secondary | ICD-10-CM | POA: Diagnosis present

## 2019-04-03 DIAGNOSIS — K915 Postcholecystectomy syndrome: Secondary | ICD-10-CM | POA: Diagnosis not present

## 2019-04-03 DIAGNOSIS — E11649 Type 2 diabetes mellitus with hypoglycemia without coma: Secondary | ICD-10-CM | POA: Diagnosis not present

## 2019-04-03 DIAGNOSIS — E861 Hypovolemia: Secondary | ICD-10-CM | POA: Diagnosis present

## 2019-04-03 DIAGNOSIS — Z7982 Long term (current) use of aspirin: Secondary | ICD-10-CM

## 2019-04-03 DIAGNOSIS — N19 Unspecified kidney failure: Secondary | ICD-10-CM

## 2019-04-03 DIAGNOSIS — R531 Weakness: Secondary | ICD-10-CM

## 2019-04-03 DIAGNOSIS — G9340 Encephalopathy, unspecified: Secondary | ICD-10-CM | POA: Diagnosis present

## 2019-04-03 DIAGNOSIS — I120 Hypertensive chronic kidney disease with stage 5 chronic kidney disease or end stage renal disease: Secondary | ICD-10-CM | POA: Diagnosis not present

## 2019-04-03 DIAGNOSIS — Y738 Miscellaneous gastroenterology and urology devices associated with adverse incidents, not elsewhere classified: Secondary | ICD-10-CM | POA: Diagnosis present

## 2019-04-03 DIAGNOSIS — Z7951 Long term (current) use of inhaled steroids: Secondary | ICD-10-CM

## 2019-04-03 DIAGNOSIS — Z79899 Other long term (current) drug therapy: Secondary | ICD-10-CM

## 2019-04-03 DIAGNOSIS — N2581 Secondary hyperparathyroidism of renal origin: Secondary | ICD-10-CM | POA: Diagnosis present

## 2019-04-03 DIAGNOSIS — Z8249 Family history of ischemic heart disease and other diseases of the circulatory system: Secondary | ICD-10-CM

## 2019-04-03 DIAGNOSIS — I152 Hypertension secondary to endocrine disorders: Secondary | ICD-10-CM | POA: Diagnosis present

## 2019-04-03 DIAGNOSIS — E860 Dehydration: Secondary | ICD-10-CM | POA: Diagnosis present

## 2019-04-03 DIAGNOSIS — Z6837 Body mass index (BMI) 37.0-37.9, adult: Secondary | ICD-10-CM

## 2019-04-03 DIAGNOSIS — E1122 Type 2 diabetes mellitus with diabetic chronic kidney disease: Secondary | ICD-10-CM | POA: Diagnosis present

## 2019-04-03 DIAGNOSIS — E669 Obesity, unspecified: Secondary | ICD-10-CM | POA: Diagnosis present

## 2019-04-03 DIAGNOSIS — E119 Type 2 diabetes mellitus without complications: Secondary | ICD-10-CM | POA: Diagnosis present

## 2019-04-03 LAB — BASIC METABOLIC PANEL
Anion gap: 24 — ABNORMAL HIGH (ref 5–15)
BUN: 132 mg/dL — ABNORMAL HIGH (ref 6–20)
CO2: 18 mmol/L — ABNORMAL LOW (ref 22–32)
Calcium: 7.9 mg/dL — ABNORMAL LOW (ref 8.9–10.3)
Chloride: 87 mmol/L — ABNORMAL LOW (ref 98–111)
Creatinine, Ser: 15.5 mg/dL — ABNORMAL HIGH (ref 0.44–1.00)
GFR calc Af Amer: 3 mL/min — ABNORMAL LOW (ref >60)
GFR calc non Af Amer: 2 mL/min — ABNORMAL LOW (ref >60)
Glucose, Bld: 200 mg/dL — ABNORMAL HIGH (ref 70–99)
Potassium: 4.2 mmol/L (ref 3.5–5.1)
Sodium: 129 mmol/L — ABNORMAL LOW (ref 135–145)

## 2019-04-03 LAB — CBC
HCT: 36.1 % (ref 36.0–46.0)
Hemoglobin: 12.1 g/dL (ref 12.0–15.0)
MCH: 22.2 pg — ABNORMAL LOW (ref 26.0–34.0)
MCHC: 33.5 g/dL (ref 30.0–36.0)
MCV: 66.1 fL — ABNORMAL LOW (ref 80.0–100.0)
Platelets: 263 10*3/uL (ref 150–400)
RBC: 5.46 MIL/uL — ABNORMAL HIGH (ref 3.87–5.11)
RDW: 20.5 % — ABNORMAL HIGH (ref 11.5–15.5)
WBC: 6.3 10*3/uL (ref 4.0–10.5)
nRBC: 0 % (ref 0.0–0.2)

## 2019-04-03 LAB — HEMOGLOBIN A1C
Hgb A1c MFr Bld: 8.5 % — ABNORMAL HIGH (ref 4.8–5.6)
Mean Plasma Glucose: 197.25 mg/dL

## 2019-04-03 LAB — GLUCOSE, CAPILLARY
Glucose-Capillary: 195 mg/dL — ABNORMAL HIGH (ref 70–99)
Glucose-Capillary: 206 mg/dL — ABNORMAL HIGH (ref 70–99)

## 2019-04-03 MED ORDER — CALCITRIOL 0.25 MCG PO CAPS
0.5000 ug | ORAL_CAPSULE | ORAL | Status: DC
  Administered 2019-04-04 – 2019-04-05 (×2): 0.5 ug via ORAL
  Filled 2019-04-03 (×2): qty 2

## 2019-04-03 MED ORDER — INSULIN ASPART 100 UNIT/ML ~~LOC~~ SOLN: 0.0000 [IU] | Freq: Three times a day (TID) | SUBCUTANEOUS | Status: DC

## 2019-04-03 MED ORDER — AMLODIPINE BESYLATE 5 MG PO TABS: 10.0000 mg | ORAL_TABLET | Freq: Every day | ORAL | Status: DC

## 2019-04-03 MED ORDER — AMLODIPINE BESYLATE 10 MG PO TABS
10.0000 mg | ORAL_TABLET | Freq: Every day | ORAL | Status: DC
  Administered 2019-04-04 – 2019-04-09 (×5): 10 mg via ORAL
  Filled 2019-04-03 (×5): qty 1

## 2019-04-03 MED ORDER — LABETALOL HCL 200 MG PO TABS
200.0000 mg | ORAL_TABLET | Freq: Two times a day (BID) | ORAL | Status: DC
  Administered 2019-04-04: 11:00:00 200 mg via ORAL
  Filled 2019-04-03 (×6): qty 1

## 2019-04-03 MED ORDER — LOSARTAN POTASSIUM 50 MG PO TABS
100.0000 mg | ORAL_TABLET | Freq: Every day | ORAL | Status: DC
  Administered 2019-04-04 – 2019-04-09 (×5): 100 mg via ORAL
  Filled 2019-04-03 (×5): qty 2

## 2019-04-03 MED ORDER — DOXAZOSIN MESYLATE 8 MG PO TABS
8.0000 mg | ORAL_TABLET | Freq: Every day | ORAL | Status: DC
  Filled 2019-04-03: qty 1

## 2019-04-03 MED ORDER — ASPIRIN EC 81 MG PO TBEC
81.0000 mg | DELAYED_RELEASE_TABLET | Freq: Every day | ORAL | Status: DC
  Administered 2019-04-04: 81 mg via ORAL
  Filled 2019-04-03: qty 1

## 2019-04-03 MED ORDER — ACETAMINOPHEN 325 MG PO TABS
650.0000 mg | ORAL_TABLET | Freq: Four times a day (QID) | ORAL | Status: DC | PRN
  Administered 2019-04-04 – 2019-04-12 (×7): 650 mg via ORAL
  Filled 2019-04-03 (×7): qty 2

## 2019-04-03 MED ORDER — IPRATROPIUM-ALBUTEROL 20-100 MCG/ACT IN AERS
2.0000 | INHALATION_SPRAY | Freq: Four times a day (QID) | RESPIRATORY_TRACT | Status: DC | PRN
  Filled 2019-04-03: qty 4

## 2019-04-03 MED ORDER — DELFLEX-LC/1.5% DEXTROSE 344 MOSM/L IP SOLN
INTRAPERITONEAL | Status: DC
  Administered 2019-04-05 – 2019-04-08 (×2): 8 L via INTRAPERITONEAL
  Filled 2019-04-03: qty 3000

## 2019-04-03 MED ORDER — LOSARTAN POTASSIUM 50 MG PO TABS: 100.0000 mg | ORAL_TABLET | Freq: Every day | ORAL | Status: DC

## 2019-04-03 MED ORDER — INSULIN GLARGINE 100 UNIT/ML ~~LOC~~ SOLN
10.0000 [IU] | Freq: Every day | SUBCUTANEOUS | Status: DC
  Administered 2019-04-04 – 2019-04-05 (×2): 10 [IU] via SUBCUTANEOUS
  Filled 2019-04-03 (×4): qty 0.1

## 2019-04-03 MED ORDER — GENTAMICIN SULFATE 0.1 % EX CREA
1.0000 "application " | TOPICAL_CREAM | Freq: Every day | CUTANEOUS | Status: DC
  Administered 2019-04-04 – 2019-04-13 (×14): 1 via TOPICAL
  Filled 2019-04-03 (×3): qty 15

## 2019-04-03 MED ORDER — ACETAMINOPHEN 650 MG RE SUPP: 650.0000 mg | Freq: Four times a day (QID) | RECTAL | Status: DC | PRN

## 2019-04-03 MED ORDER — HEPARIN SODIUM (PORCINE) 5000 UNIT/ML IJ SOLN
5000.0000 [IU] | Freq: Three times a day (TID) | INTRAMUSCULAR | Status: DC
  Administered 2019-04-04: 06:00:00 5000 [IU] via SUBCUTANEOUS
  Filled 2019-04-03: qty 1

## 2019-04-03 NOTE — H&P (Signed)
History and Physical    Janet Mitchell VZS:[id number] DOB: 1965/08/28 DOA: 04/03/2019  PCP: Janet Lei, MD  Patient coming from: Home  I have personally briefly reviewed patient's old medical records in [place]  Chief Complaint: Needs dialysis  HPI: Janet Mitchell is a 54 y.o. female with medical history significant for ESRD on peritoneal dialysis, insulin-dependent type 2 diabetes, hypertension, and recent admission for COVID-19 infection who presents to the ED for dialysis needs.  Patient was recently admitted at [facility] from 03/25/2019-03/30/2019 for gastroenteritis and COVID-19 pneumonia.  She was treated with a 5-day course of remdesivir and Solu-Medrol.  Also received IV ceftriaxone empirically for suspected superimposed bacterial pneumonia.  She was discharged home with a 5-day course of oral Decadron 6 mg x 5 days.  Since discharge, patient has been seen in the ED daily for generalized weakness and slow speech.  CT head without contrast on 04/01/2019 was negative for acute intracranial abnormality.  Per ED documentation, MRI brain was planned for further evaluation however patient preferred to return home without getting the MRI.  She returned to [facility] ED and MRI brain 04/02/2019 was a partially motion degraded study without obvious acute infarction.  Of note, labs show trend of worsening BUN and creatinine with relatively stable hyponatremia and elevated anion gap.  Patient states that since she was discharged from her recent hospitalization that she has not been able to complete peritoneal dialysis at home even when having assistance from her husband and other family members.  She states she has been too weak and tired to continue her dialysis.  She says she went to her nephrologist today and after having routine lab work completed she was called to come to the ED for dialysis needs.  Patient currently denies any chest pain, cough, abdominal pain.  She states  she no longer makes urine.  She says her shortness of breath is improving since returning home.  She denies any subjective fevers, chills, diaphoresis, peripheral edema.  ED Course:  Initial vitals showed BP 138/82, pulse 84, RR 18, temp 97.7 Fahrenheit, SPO2 97% on room air.  Labs are notable for BUN 132, creatinine 15.5, sodium 129, potassium 4.2, bicarb 18, serum glucose 200, anion gap 24, WBC 6.3, hemoglobin 12.1, platelets 263,000.  EDP discussed with on-call nephrology who recommended admission for dialysis needs.  The hospitalist service was consulted admit for further evaluation and management.  Review of Systems: All systems reviewed and are negative except as documented in history of present illness above.   Past Medical History:  Diagnosis Date  . Anemia   . CHF (congestive heart failure) ([place])   . Chronic cholecystitis with calculus   . ESRD (end stage renal disease) on dialysis [facility])    "peritoneal dialysis q hs" (03/09/2018)  . Headache    "a few/wk" (03/09/2018)  . History of blood transfusion 10/2017   "low blood count" (03/09/2018)  . Hypertension   . Spinal headache   . Type II diabetes mellitus ([place])     Past Surgical History:  Procedure Laterality Date  . AMPUTATION TOE Left 2013   Great toe  . BIOPSY  10/24/2018   Procedure: BIOPSY;  Surgeon: Janet Dolin, MD;  [facility];  Service: Endoscopy;;  right and left colon  . CATARACT EXTRACTION W/ INTRAOCULAR LENS IMPLANT Right   . CESAREAN SECTION  1994; 1998  . CHOLECYSTECTOMY N/A 03/09/2018   Procedure: LAPAROSCOPIC CHOLECYSTECTOMY WITH INTRAOPERATIVE CHOLANGIOGRAM ERAS PATHWAY;  Surgeon: Janet Mesa, MD;  Location: [place];  Service: General;  Laterality: N/A;  . COLONOSCOPY N/A 10/24/2018   Procedure: COLONOSCOPY;  Surgeon: Janet Dolin, MD;  [facility];  Service: Endoscopy;  Laterality: N/A;  1:00pm  . EYE SURGERY  05/15/2018   Removal of blood in the globe (due to DM)  .  FLEXIBLE SIGMOIDOSCOPY N/A 11/24/2017   Procedure: FLEXIBLE SIGMOIDOSCOPY;  Surgeon: Janet Dolin, MD;  [facility];  Service: Endoscopy;  Laterality: N/A;  . LAPAROSCOPIC CHOLECYSTECTOMY  03/09/2018  . PERITONEAL CATHETER INSERTION  2017  . POLYPECTOMY  10/24/2018   Procedure: POLYPECTOMY;  Surgeon: Janet Dolin, MD;  [facility];  Service: Endoscopy;;  . TOTAL HIP ARTHROPLASTY Left 1997    Social History:  reports that she is a non-smoker but has been exposed to tobacco smoke. She has never used smokeless tobacco. She reports that she does not drink alcohol or use drugs.  No Known Allergies  Family History  Problem Relation Age of Onset  . Heart disease Mother   . Thrombocytopenia Mother        TTP  . Heart failure Father   . Kidney disease Paternal Grandfather   . Colon cancer Neg Hx      Prior to Admission medications   Medication Sig Start Date End Date Taking? Authorizing Provider  ALPRAZolam Duanne Moron) 0.5 MG tablet Take 0.5 mg by mouth at bedtime as needed for anxiety.    [provider]  amLODipine (NORVASC) 10 MG tablet Take 1 tablet (10 mg total) by mouth daily. 11/09/17   Janet Hockey, MD  ascorbic acid (VITAMIN C) 500 MG tablet Take 1 tablet (500 mg total) by mouth daily. 03/31/19   Janet Hillock, MD  aspirin EC 81 MG tablet Take 81 mg by mouth daily.    [provider]  Blood Glucose Monitoring Suppl (ACCU-CHEK GUIDE) w/Device KIT 1 Piece by Does not apply route as directed. Patient not taking: Reported on 03/26/2019 02/28/19   Janet Anger, MD  calcitRIOL (ROCALTROL) 0.5 MCG capsule Take 0.5-1 mcg by mouth See admin instructions. 0.11mg on Mondays through Fridays and take 128m on Saturdays and Sundays    [provider]  Crisaborole (EUCRISA) 2 % OINT Apply 1 application topically daily as needed (for rash/irritation).    [provider]  dexamethasone (DECADRON) 6 MG tablet Take 1 tablet (6 mg  total) by mouth daily. 03/30/19   LaOswald HillockMD  doxazosin (CARDURA) 8 MG tablet Take 8 mg by mouth at bedtime.    [provider]  glucose blood (ACCU-CHEK GUIDE) test strip Use as instructed 02/28/19   NiCassandria AngerMD  Hyoscyamine Sulfate SL (LEVSIN/SL) 0.125 MG SUBL Place 0.125 mg under the tongue 3 (three) times daily as needed (diarrhea). 03/19/19   GiCarlis StableNP  Insulin Glargine, 2 Unit Dial, 300 UNIT/ML SOPN Inject 20 Units into the skin at bedtime. 02/28/19   NiCassandria AngerMD  Ipratropium-Albuterol (COMBIVENT) 20-100 MCG/ACT AERS respimat Inhale 2 puffs into the lungs every 6 (six) hours as needed for wheezing or shortness of breath. 03/30/19   LaOswald HillockMD  labetalol (NORMODYNE) 200 MG tablet Take 200 mg by mouth 2 (two) times daily.    [provider]  losartan (COZAAR) 100 MG tablet Take 100 mg by mouth daily.    [provider]  zinc sulfate 220 (50 Zn) MG capsule Take 1 capsule (  220 mg total) by mouth daily. 03/31/19   Janet Hillock, MD    Physical Exam: Vitals:   04/03/19 1737 04/03/19 1738 04/03/19 2050  BP: 138/82  (!) 144/85  Pulse: 74    Resp: 18  17  Temp: 97.7 F (36.5 C)  97.9 F (36.6 C)  TempSrc: Oral  Oral  SpO2: 97%  100%  Weight:  103.9 kg   Height:  '5\' 8"'  (1.727 m)     Constitutional: Obese woman resting in bed in the left lateral decubitus position, NAD, calm, appears tired comfortable Eyes: PERRL, lids and conjunctivae normal ENMT: Mucous membranes are moist. Posterior pharynx clear of any exudate or lesions. Neck: normal, supple, no masses. Respiratory: clear to auscultation bilaterally, no wheezing, no crackles. Normal respiratory effort. No accessory muscle use.  Cardiovascular: Regular rate and rhythm, no murmurs / rubs / gallops. No extremity edema. 2+ pedal pulses. Abdomen: no tenderness, no masses palpated. No hepatosplenomegaly. Bowel sounds positive.  PD catheter in place right  abdomen. Musculoskeletal: no clubbing / cyanosis. No joint deformity upper and lower extremities. Good ROM, no contractures. Normal muscle tone.  Skin: no rashes, lesions, ulcers. No induration Neurologic: Speech is slow but CN 2-12 grossly intact. Sensation intact.  Slight generalized weakness but moving all extremities spontaneously and strength 5/5 in all 4.  Psychiatric: Normal judgment and insight. Alert and oriented x 3. Normal mood.    Labs on Admission: I have personally reviewed following labs and imaging studies  CBC: Recent Labs  Lab 03/28/19 0500 03/29/19 0231 03/30/19 0405 03/31/19 2112 04/02/19 1714 04/03/19 1749  WBC 11.0* 8.5 8.4 6.7 8.3 6.3  NEUTROABS 10.0* 7.5 7.5  --  6.2  --   HGB 11.6* 12.1 11.9* 12.6 11.7* 12.1  HCT 35.1* 37.1 36.3 38.2 36.1 36.1  MCV 68.0* 68.7* 68.5* 68.2* 68.2* 66.1*  PLT 301 253 251 287 258 974   Basic Metabolic Panel: Recent Labs  Lab 03/28/19 0500 03/29/19 0231 03/30/19 0405 03/30/19 1454 03/31/19 2112 04/02/19 1714 04/03/19 1749  NA 126* 125* 125* 129* 126* 127* 129*  K 3.4* 3.7 3.4* 3.6 3.9 3.4* 4.2  CL 87* 86* 84* 90* 85* 86* 87*  CO2 18* 19* 19* 17* 20* 19* 18*  GLUCOSE 314* 441* 500* 218* 292* 209* 200*  BUN 69* 74* 79* 90* 112* 132* 132*  CREATININE 11.15* 10.53* 10.08* 10.33* 12.32* 14.54* 15.50*  CALCIUM 7.6* 7.6* 7.7* 7.8* 7.6* 7.5* 7.9*  MG 1.6* 1.5* 1.9  --   --   --   --   PHOS 6.8*  --   --   --   --   --   --    GFR: Estimated Creatinine Clearance: 5.4 mL/min (A) (by C-G formula based on SCr of 15.5 mg/dL (H)). Liver Function Tests: Recent Labs  Lab 03/28/19 0500 03/29/19 0231 03/30/19 0405 04/02/19 1714  AST 17 20 13* 12*  ALT '9 10 9 9  ' ALKPHOS 103 94 92 78  BILITOT 0.7 0.8 0.6 0.5  PROT 6.9 6.9 6.4* 6.3*  ALBUMIN 1.7* 1.8* 1.8* 2.2*   No results for input(s): LIPASE, AMYLASE in the last 168 hours. No results for input(s): AMMONIA in the last 168 hours. Coagulation Profile: No results for  input(s): INR, PROTIME in the last 168 hours. Cardiac Enzymes: No results for input(s): CKTOTAL, CKMB, CKMBINDEX, TROPONINI in the last 168 hours. BNP (last 3 results) No results for input(s): PROBNP in the last 8760 hours. HbA1C: No results for input(s):  HGBA1C in the last 72 hours. CBG: Recent Labs  Lab 03/30/19 0754 03/30/19 1145 03/30/19 1402 04/02/19 1657 04/03/19 1748  GLUCAP 476* 410* 269* 200* 195*   Lipid Profile: No results for input(s): CHOL, HDL, LDLCALC, TRIG, CHOLHDL, LDLDIRECT in the last 72 hours. Thyroid Function Tests: No results for input(s): TSH, T4TOTAL, FREET4, T3FREE, THYROIDAB in the last 72 hours. Anemia Panel: No results for input(s): VITAMINB12, FOLATE, FERRITIN, TIBC, IRON, RETICCTPCT in the last 72 hours. Urine analysis:    Component Value Date/Time   COLORURINE AMBER (A) 07/31/2018 1800   APPEARANCEUR TURBID (A) 07/31/2018 1800   LABSPEC >1.046 (H) 07/31/2018 1800   PHURINE 5.0 07/31/2018 1800   GLUCOSEU 150 (A) 07/31/2018 1800   HGBUR SMALL (A) 07/31/2018 1800   BILIRUBINUR NEGATIVE 07/31/2018 1800   KETONESUR NEGATIVE 07/31/2018 1800   PROTEINUR >=300 (A) 07/31/2018 1800   NITRITE NEGATIVE 07/31/2018 1800   LEUKOCYTESUR MODERATE (A) 07/31/2018 1800    Radiological Exams on Admission: MR BRAIN WO CONTRAST  Result Date: 04/02/2019 CLINICAL DATA:  Slurred speech, weakness, headache EXAM: MRI HEAD WITHOUT CONTRAST TECHNIQUE: Multiplanar, multiecho pulse sequences of the brain and surrounding structures were obtained without intravenous contrast. COMPARISON:  01/11/2019 FINDINGS: Motion artifact is present. Sagittal T1, axial and coronal DWI, and axial T2 sequences were obtained. Patient could not tolerate remainder of study. Findings below are within this limitation Brain: There is no acute infarction. There is no intracranial mass, mass effect, or edema. There is no hydrocephalus or extra-axial fluid collection. Vascular: Major vessel flow voids  at the skull base are preserved. Skull and upper cervical spine: Normal marrow signal is preserved. Sinuses/Orbits: Mild mucosal thickening. Orbits are grossly unremarkable. Other: Sella is unremarkable.  Mastoid air cells are clear. IMPRESSION: Partial, motion degraded study.  No acute infarction. Electronically Signed   By: Macy Mis M.D.   On: 04/02/2019 18:21   DG Chest Portable 1 View  Result Date: 04/02/2019 CLINICAL DATA:  Weakness. EXAM: PORTABLE CHEST 1 VIEW COMPARISON:  Jul 31, 2018 FINDINGS: Low lung volumes are seen which is likely, in part, secondary to suboptimal patient inspiration. There is subsequent vascular crowding. Very mild hazy diffuse bilateral infiltrates are seen. There is no evidence of a pleural effusion or pneumothorax. The heart size and mediastinal contours are within normal limits. Multilevel degenerative changes seen throughout the thoracic spine. IMPRESSION: 1. Low lung volumes with very mild hazy diffuse bilateral infiltrates. Electronically Signed   By: Virgina Norfolk M.D.   On: 04/02/2019 18:18    EKG: Not performed.  Assessment/Plan Principal Problem:   ESRD on peritoneal dialysis [facility]) Active Problems:   Hypertension associated with diabetes ([place])   Type 2 diabetes mellitus with ESRD (end-stage renal disease) ([place])  Avagrace Botelho Mitchell is a 54 y.o. female with medical history significant for ESRD on peritoneal dialysis, insulin-dependent type 2 diabetes, hypertension, and recent admission for COVID-19 infection who is admitted for dialysis needs.  ESRD on peritoneal dialysis: With likely uremia from inability to continue home PD.  Nephrology aware and plan for PD when patient is able to be placed in appropriate room. -Nephrology following, appreciate assistance  Generalized weakness: Likely deconditioning from recent hospitalization.  No focal weakness.  MRI of brain 04/02/2019 is negative for acute abnormality. -Request PT/OT  eval  Insulin-dependent type 2 diabetes: Continue reduced home insulin glargine qhs.  Adjust as needed.  Hypertension: Resume home amlodipine, labetalol, losartan, doxazosin.  Recent COVID-19 infection: Tested positive 03/25/2019 and completed 5-day course  of remdesivir and Solu-Medrol during hospital admission.  Asymptomatic now from the standpoint and saturating well on room air.  Continue precautions given recency of positive test and hospitalization.  DVT prophylaxis: Subcutaneous heparin Code Status: Full code, confirmed with patient Family Communication: Discussed with patient Disposition Plan: Likely discharge to home pending dialysis needs and ability to continue home PD Consults called: Nephrology Admission status: Observation   Zada Finders MD [facility]  If 7PM-7AM, please contact night-coverage [email or web address]  04/03/2019, 9:00 PM

## 2019-04-03 NOTE — ED Notes (Signed)
Pt [facility] on stretcher in exam room with no distress noted; assisted into gown & on card monitor; pt reports several days of generalized weakness and HA accomp by nausea and dizziness; st has not had her peritoneal dialysis in 2 days as well

## 2019-04-03 NOTE — ED Provider Notes (Signed)
[facility] Emergency Department Provider Note  ____________________________________________   First MD Initiated Contact with Patient 04/03/19 1942     (approximate)  I have reviewed the triage vital signs and the nursing notes.   HISTORY  Chief Complaint Weakness    HPI Janet Mitchell is a 54 y.o. female with ESRD on peritoneal dialysis with recent positive coronavirus testing 9 days ago who comes in with needing dialysis.  Patient was seen by Dr. Abigail Butts office.  She was going in for a follow-up visit from the ER and the nurse noted that the labs from yesterday were extremely abnormal for a dialysis patient.  Patient's anion gap was significantly elevated greater than 20 and there her sodium was 127.  They had sent the patient to the ER today to be admitted to get continued peritoneal dialysis and careful monitoring.  There is also some issues at home where the family does not seem to be taking care of them.  From the slurred speech perspective patient already had an MRI done yesterday that was normal.  However the BUN is significant elevated at 130 which is more likely the cause of the weakness.  Patient states she has been feeling weak and having slurred speech for over 2 days.  Her symptoms are constant, nothing is better, nothing makes it worse.  Patient states that she is not been doing dialysis because she has been feeling weak.  Her husband is normally the one who does it for her but that is why they have not been doing it.  It sounds like her last dialysis was on 1/2.  She denies any fevers.          Past Medical History:  Diagnosis Date  . Anemia   . CHF (congestive heart failure) ([place])   . Chronic cholecystitis with calculus   . ESRD (end stage renal disease) on dialysis [facility])    "peritoneal dialysis q hs" (03/09/2018)  . Headache    "a few/wk" (03/09/2018)  . History of blood transfusion 10/2017   "low blood count" (03/09/2018)  .  Hypertension   . Spinal headache   . Type II diabetes mellitus [facility])     Patient Active Problem List   Diagnosis Date Noted  . COVID-19 03/26/2019  . Acute gastroenteritis 03/25/2019  . Type II diabetes mellitus ([place])   . Hypokalemia   . Hyponatremia   . Anemia in ESRD (end-stage renal disease) ([place])   . Essential hypertension, benign 02/14/2019  . Class 2 severe obesity due to excess calories with serious comorbidity and body mass index (BMI) of 37.0 to 37.9 in adult [facility]) 02/14/2019  . SBP (spontaneous bacterial peritonitis) ([place]) 12/18/2018  . Abdominal pain 11/28/2018  . Rectal burning 08/27/2018  . Rectal bleeding 08/27/2018  . Diarrhea 05/22/2018  . Fluid overload 03/25/2018  . Microcytic anemia 03/25/2018  . Chronic cholecystitis with calculus 03/09/2018  . Type 2 diabetes mellitus with ESRD (end-stage renal disease) ([place]) 03/09/2018  . Pre-transplant evaluation for ESRD (end stage renal disease) 12/19/2017  . Symptomatic anemia 11/06/2017  . Hypertension 11/06/2017  . ESRD on peritoneal dialysis ([place]) 11/06/2017  . Diabetic macular edema ([place]) 04/10/2015  . Edema of lower extremity 04/10/2015  . Uncontrolled type 2 diabetes mellitus ([place]) 04/10/2015    Past Surgical History:  Procedure Laterality Date  . AMPUTATION TOE Left 2013   Great toe  . BIOPSY  10/24/2018   Procedure: BIOPSY;  Surgeon: Daneil Dolin, MD;  [facility]:  [facility] END[facility];  Service: Endoscopy;;  right and left colon  . CATARACT EXTRACTION W/ INTRAOCULAR LENS IMPLANT Right   . CESAREAN SECTION  1994; 1998  . CHOLECYSTECTOMY N/A 03/09/2018   Procedure: LAPAROSCOPIC CHOLECYSTECTOMY WITH INTRAOPERATIVE CHOLANGIOGRAM ERAS PATHWAY;  Surgeon: Tsuei, Matthew, MD;  Location: [facility] OR;  Service: General;  Laterality: N/A;  . COLONOSCOPY N/A 10/24/2018   Procedure: COLONOSCOPY;  Surgeon: Rourk, Robert M, MD;  Location: [facility] ENDO SUITE;  Service: Endoscopy;  Laterality: N/A;  1:00pm  . EYE SURGERY  05/15/2018    Removal of blood in the globe (due to DM)  . FLEXIBLE SIGMOIDOSCOPY N/A 11/24/2017   Procedure: FLEXIBLE SIGMOIDOSCOPY;  Surgeon: Rourk, Robert M, MD;  Location: [facility] END[facility];  Service: Endoscopy;  Laterality: N/A;  . LAPAROSCOPIC CHOLECYSTECTOMY  03/09/2018  . PERITONEAL CATHETER INSERTION  2017  . POLYPECTOMY  10/24/2018   Procedure: POLYPECTOMY;  Surgeon: Rourk, Robert M, MD;  Location: [facility] END[facility];  Service: Endoscopy;;  . TOTAL HIP ARTHROPLASTY Left 1997    Prior to Admission medications   Medication Sig Start Date End Date Taking? Authorizing Provider  ALPRAZolam (XANAX) 0.5 MG tablet Take 0.5 mg by mouth at bedtime as needed for anxiety.    [provider]  amLODipine (NORVASC) 10 MG tablet Take 1 tablet (10 mg total) by mouth daily. 11/09/17   Emokpae, Courage, MD  ascorbic acid (VITAMIN C) 500 MG tablet Take 1 tablet (500 mg total) by mouth daily. 03/31/19   Lama, Gagan S, MD  aspirin EC 81 MG tablet Take 81 mg by mouth daily.    [provider]  Blood Glucose Monitoring Suppl (ACCU-CHEK GUIDE) w/Device KIT 1 Piece by Does not apply route as directed. Patient not taking: Reported on 03/26/2019 02/28/19   Nida, Gebreselassie W, MD  calcitRIOL (ROCALTROL) 0.5 MCG capsule Take 0.5-1 mcg by mouth See admin instructions. 0.5mcg on Mondays through Fridays and take 1mcg on Saturdays and Sundays    [provider]  Crisaborole (EUCRISA) 2 % OINT Apply 1 application topically daily as needed (for rash/irritation).    [provider]  dexamethasone (DECADRON) 6 MG tablet Take 1 tablet (6 mg total) by mouth daily. 03/30/19   Lama, Gagan S, MD  doxazosin (CARDURA) 8 MG tablet Take 8 mg by mouth at bedtime.    [provider]  glucose blood (ACCU-CHEK GUIDE) test strip Use as instructed 02/28/19   Nida, Gebreselassie W, MD  Hyoscyamine Sulfate SL (LEVSIN/SL) 0.125 MG SUBL Place 0.125 mg under the tongue 3 (three) times daily as needed (diarrhea). 03/19/19    Gill, Eric A, NP  Insulin Glargine, 2 Unit Dial, 300 UNIT/ML SOPN Inject 20 Units into the skin at bedtime. 02/28/19   Nida, Gebreselassie W, MD  Ipratropium-Albuterol (COMBIVENT) 20-100 MCG/ACT AERS respimat Inhale 2 puffs into the lungs every 6 (six) hours as needed for wheezing or shortness of breath. 03/30/19   Lama, Gagan S, MD  labetalol (NORMODYNE) 200 MG tablet Take 200 mg by mouth 2 (two) times daily.    [provider]  losartan (COZAAR) 100 MG tablet Take 100 mg by mouth daily.    [provider]  zinc sulfate 220 (50 Zn) MG capsule Take 1 capsule (220 mg total) by mouth daily. 03/31/19   Lama, Gagan S, MD    Allergies Patient has no known allergies.  Family History  Problem Relation Age of Onset  . Heart disease Mother   . Thrombocytopenia Mother          TTP  . Heart failure Father   . Kidney disease Paternal Grandfather   . Colon cancer Neg Hx     Social History Social History   Tobacco Use  . Smoking status: Passive Smoke Exposure - Never Smoker  . Smokeless tobacco: Never Used  Substance Use Topics  . Alcohol use: Never  . Drug use: Never      Review of Systems Constitutional: No fever/chills, positive weakness Eyes: No visual changes. ENT: No sore throat. Cardiovascular: Denies chest pain. Respiratory: Denies shortness of breath. Gastrointestinal: No abdominal pain.  No nausea, no vomiting.  No diarrhea.  No constipation. Genitourinary: Negative for dysuria. Musculoskeletal: Negative for back pain. Skin: Negative for rash. Neurological: Negative for headaches, focal weakness or numbness.  Positive slurred speech All other ROS negative ____________________________________________   PHYSICAL EXAM:  VITAL SIGNS: ED Triage Vitals  Enc Vitals Group     BP 04/03/19 1737 138/82     Pulse Rate 04/03/19 1737 74     Resp 04/03/19 1737 18     Temp 04/03/19 1737 97.7 F (36.5 C)     Temp Source 04/03/19 1737 Oral     SpO2 04/03/19 1737 97 %       Weight 04/03/19 1738 229 lb 0.9 oz (103.9 kg)     Height 04/03/19 1738 5' 8" (1.727 m)     Head Circumference --      Peak Flow --      Pain Score --      Pain Loc --      Pain Edu? --      Excl. in GC? --     Constitutional: Alert and oriented. Well appearing and in no acute distress. Eyes: Conjunctivae are normal. EOMI. Head: Atraumatic. Nose: No congestion/rhinnorhea. Mouth/Throat: Mucous membranes are moist.   Neck: No stridor. Trachea Midline. FROM Cardiovascular: Normal rate, regular rhythm. Grossly normal heart sounds.  Good peripheral circulation. Respiratory: Normal respiratory effort.  No retractions.  Gastrointestinal: Soft and nontender. No distention. No abdominal bruits.  Peritoneal dialysis. Musculoskeletal: No lower extremity tenderness nor edema.  No joint effusions. Neurologic:  Normal speech somewhat slow to answer but does not really seem to be slurred.  Equal strength in her arms.  Able to lift up both of her legs.  No facial droop. Skin:  Skin is warm, dry and intact. No rash noted. Psychiatric: Mood and affect are normal. Speech and behavior are normal. GU: Deferred   ____________________________________________   LABS (all labs ordered are listed, but only abnormal results are displayed)  Labs Reviewed  CBC - Abnormal; Notable for the following components:      Result Value   RBC 5.46 (*)    MCV 66.1 (*)    MCH 22.2 (*)    RDW 20.5 (*)    All other components within normal limits  BASIC METABOLIC PANEL - Abnormal; Notable for the following components:   Sodium 129 (*)    Chloride 87 (*)    CO2 18 (*)    Glucose, Bld 200 (*)    BUN 132 (*)    Creatinine, Ser 15.50 (*)    Calcium 7.9 (*)    GFR calc non Af Amer 2 (*)    GFR calc Af Amer 3 (*)    Anion gap 24 (*)    All other components within normal limits  GLUCOSE, CAPILLARY - Abnormal; Notable for the following components:   Glucose-Capillary 195 (*)    All other components within    normal limits  CBC  RENAL FUNCTION PANEL  HEMOGLOBIN A1C  CBG MONITORING, ED   ____________________________________________   ED ECG REPORT I, Mary E Funke, the attending physician, personally viewed and interpreted this ECG.  EKG is normal sinus rate of 68, no ST elevations, no T wave inversions, normal intervals ____________________________________________   INITIAL IMPRESSION / ASSESSMENT AND PLAN / ED COURSE  Janet Mitchell was evaluated in Emergency Department on 04/03/2019 for the symptoms described in the history of present illness. She was evaluated in the context of the global COVID-19 pandemic, which necessitated consideration that the patient might be at risk for infection with the SARS-CoV-2 virus that causes COVID-19. Institutional protocols and algorithms that pertain to the evaluation of patients at risk for COVID-19 are in a state of rapid change based on information released by regulatory bodies including the CDC and federal and state organizations. These policies and algorithms were followed during the patient's care in the ED.    Patient is a 53-year-old who comes in for weakness and slurred speech.  Patient already had an MRI done yesterday that did not show evidence of stroke and her symptoms had already been going on for 24 hours at that point.  However she had significant electrolyte abnormalities as well as her BUN was significantly elevated which is more likely the cause of her weakness and her difficulty with speech.  Will get repeat labs to ensure that they are not going up any larger.  She denies any infectious symptoms.  Patient is currently coronavirus positive but she satting 100% and she has no increased work of breathing but is possible that is also contributing to her weakness.  Labs are similar to yesterday with elevated BUN anion gap and low sodium.  Discussed with Dr. Latif and he is aware of patient.  Will get patient to the hospital to get  dialysis.  ____________________________________________   FINAL CLINICAL IMPRESSION(S) / ED DIAGNOSES   Final diagnoses:  Elevated BUN  ESRD (end stage renal disease) (HCC)  Hyponatremia  COVID-19      MEDICATIONS GIVEN DURING THIS VISIT:  Medications  heparin injection 5,000 Units (has no administration in time range)  acetaminophen (TYLENOL) tablet 650 mg (has no administration in time range)    Or  acetaminophen (TYLENOL) suppository 650 mg (has no administration in time range)  aspirin EC tablet 81 mg (has no administration in time range)  calcitRIOL (ROCALTROL) capsule 0.5-1 mcg (has no administration in time range)  doxazosin (CARDURA) tablet 8 mg (has no administration in time range)  Ipratropium-Albuterol (COMBIVENT) respimat 2 puff (has no administration in time range)  labetalol (NORMODYNE) tablet 200 mg (has no administration in time range)  Insulin Glargine (2 Unit Dial) SOPN 10 Units (has no administration in time range)  amLODipine (NORVASC) tablet 10 mg (has no administration in time range)  losartan (COZAAR) tablet 100 mg (has no administration in time range)     ED Discharge Orders    None       Note:  This document was prepared using Dragon voice recognition software and may include unintentional dictation errors.   Funke, Mary E, MD 04/03/19 2138  

## 2019-04-03 NOTE — ED Triage Notes (Signed)
Pt brought in by her husband for weakness, mulitple falls, pt states "im here for stroke sx" when asked what those are pt states slurred speech unable to move different body parts intermittently, pt has clear speech on arrival pt was seen at [facility], [place] for the same complaints 1/3,1/4, 1/5 and then again today.

## 2019-04-04 DIAGNOSIS — K529 Noninfective gastroenteritis and colitis, unspecified: Secondary | ICD-10-CM | POA: Diagnosis not present

## 2019-04-04 DIAGNOSIS — N19 Unspecified kidney failure: Secondary | ICD-10-CM | POA: Diagnosis not present

## 2019-04-04 DIAGNOSIS — I10 Essential (primary) hypertension: Secondary | ICD-10-CM | POA: Diagnosis not present

## 2019-04-04 DIAGNOSIS — E871 Hypo-osmolality and hyponatremia: Secondary | ICD-10-CM | POA: Diagnosis present

## 2019-04-04 DIAGNOSIS — Z794 Long term (current) use of insulin: Secondary | ICD-10-CM | POA: Diagnosis not present

## 2019-04-04 DIAGNOSIS — E875 Hyperkalemia: Secondary | ICD-10-CM | POA: Diagnosis not present

## 2019-04-04 DIAGNOSIS — Z8616 Personal history of COVID-19: Secondary | ICD-10-CM | POA: Diagnosis not present

## 2019-04-04 DIAGNOSIS — D62 Acute posthemorrhagic anemia: Secondary | ICD-10-CM | POA: Diagnosis not present

## 2019-04-04 DIAGNOSIS — R531 Weakness: Secondary | ICD-10-CM

## 2019-04-04 DIAGNOSIS — K921 Melena: Secondary | ICD-10-CM | POA: Diagnosis not present

## 2019-04-04 DIAGNOSIS — I120 Hypertensive chronic kidney disease with stage 5 chronic kidney disease or end stage renal disease: Secondary | ICD-10-CM | POA: Diagnosis not present

## 2019-04-04 DIAGNOSIS — T50995A Adverse effect of other drugs, medicaments and biological substances, initial encounter: Secondary | ICD-10-CM | POA: Diagnosis not present

## 2019-04-04 DIAGNOSIS — G9340 Encephalopathy, unspecified: Secondary | ICD-10-CM | POA: Diagnosis present

## 2019-04-04 DIAGNOSIS — I959 Hypotension, unspecified: Secondary | ICD-10-CM | POA: Diagnosis not present

## 2019-04-04 DIAGNOSIS — E1169 Type 2 diabetes mellitus with other specified complication: Secondary | ICD-10-CM | POA: Diagnosis present

## 2019-04-04 DIAGNOSIS — R1030 Lower abdominal pain, unspecified: Secondary | ICD-10-CM | POA: Diagnosis not present

## 2019-04-04 DIAGNOSIS — Z96642 Presence of left artificial hip joint: Secondary | ICD-10-CM | POA: Diagnosis present

## 2019-04-04 DIAGNOSIS — N186 End stage renal disease: Secondary | ICD-10-CM | POA: Diagnosis not present

## 2019-04-04 DIAGNOSIS — Z8249 Family history of ischemic heart disease and other diseases of the circulatory system: Secondary | ICD-10-CM | POA: Diagnosis not present

## 2019-04-04 DIAGNOSIS — Y738 Miscellaneous gastroenterology and urology devices associated with adverse incidents, not elsewhere classified: Secondary | ICD-10-CM | POA: Diagnosis present

## 2019-04-04 DIAGNOSIS — E1122 Type 2 diabetes mellitus with diabetic chronic kidney disease: Secondary | ICD-10-CM | POA: Diagnosis not present

## 2019-04-04 DIAGNOSIS — R42 Dizziness and giddiness: Secondary | ICD-10-CM | POA: Diagnosis not present

## 2019-04-04 DIAGNOSIS — Y838 Other surgical procedures as the cause of abnormal reaction of the patient, or of later complication, without mention of misadventure at the time of the procedure: Secondary | ICD-10-CM | POA: Diagnosis present

## 2019-04-04 DIAGNOSIS — D631 Anemia in chronic kidney disease: Secondary | ICD-10-CM | POA: Diagnosis not present

## 2019-04-04 DIAGNOSIS — Z89412 Acquired absence of left great toe: Secondary | ICD-10-CM | POA: Diagnosis not present

## 2019-04-04 DIAGNOSIS — E11649 Type 2 diabetes mellitus with hypoglycemia without coma: Secondary | ICD-10-CM | POA: Diagnosis not present

## 2019-04-04 DIAGNOSIS — Z7982 Long term (current) use of aspirin: Secondary | ICD-10-CM | POA: Diagnosis not present

## 2019-04-04 DIAGNOSIS — J9601 Acute respiratory failure with hypoxia: Secondary | ICD-10-CM | POA: Diagnosis not present

## 2019-04-04 DIAGNOSIS — R109 Unspecified abdominal pain: Secondary | ICD-10-CM | POA: Diagnosis not present

## 2019-04-04 DIAGNOSIS — R195 Other fecal abnormalities: Secondary | ICD-10-CM | POA: Diagnosis not present

## 2019-04-04 DIAGNOSIS — R197 Diarrhea, unspecified: Secondary | ICD-10-CM | POA: Diagnosis not present

## 2019-04-04 DIAGNOSIS — Y92239 Unspecified place in hospital as the place of occurrence of the external cause: Secondary | ICD-10-CM | POA: Diagnosis not present

## 2019-04-04 DIAGNOSIS — Z992 Dependence on renal dialysis: Secondary | ICD-10-CM | POA: Diagnosis not present

## 2019-04-04 DIAGNOSIS — Z9049 Acquired absence of other specified parts of digestive tract: Secondary | ICD-10-CM | POA: Diagnosis not present

## 2019-04-04 DIAGNOSIS — N2581 Secondary hyperparathyroidism of renal origin: Secondary | ICD-10-CM | POA: Diagnosis not present

## 2019-04-04 DIAGNOSIS — K915 Postcholecystectomy syndrome: Secondary | ICD-10-CM | POA: Diagnosis present

## 2019-04-04 DIAGNOSIS — I700 Atherosclerosis of aorta: Secondary | ICD-10-CM | POA: Diagnosis present

## 2019-04-04 LAB — GLUCOSE, CAPILLARY
Glucose-Capillary: 170 mg/dL — ABNORMAL HIGH (ref 70–99)
Glucose-Capillary: 263 mg/dL — ABNORMAL HIGH (ref 70–99)
Glucose-Capillary: 271 mg/dL — ABNORMAL HIGH (ref 70–99)
Glucose-Capillary: 162 mg/dL — ABNORMAL HIGH (ref 70–99)

## 2019-04-04 LAB — CBC
HCT: 35.5 % — ABNORMAL LOW (ref 36.0–46.0)
Hemoglobin: 11.6 g/dL — ABNORMAL LOW (ref 12.0–15.0)
MCH: 21.8 pg — ABNORMAL LOW (ref 26.0–34.0)
MCHC: 32.7 g/dL (ref 30.0–36.0)
MCV: 66.9 fL — ABNORMAL LOW (ref 80.0–100.0)
Platelets: 237 10*3/uL (ref 150–400)
RBC: 5.31 MIL/uL — ABNORMAL HIGH (ref 3.87–5.11)
RDW: 20.6 % — ABNORMAL HIGH (ref 11.5–15.5)
WBC: 7.3 10*3/uL (ref 4.0–10.5)
nRBC: 0 % (ref 0.0–0.2)

## 2019-04-04 LAB — RENAL FUNCTION PANEL
Albumin: 2.3 g/dL — ABNORMAL LOW (ref 3.5–5.0)
Anion gap: 23 — ABNORMAL HIGH (ref 5–15)
BUN: 147 mg/dL — ABNORMAL HIGH (ref 6–20)
CO2: 18 mmol/L — ABNORMAL LOW (ref 22–32)
Calcium: 7.6 mg/dL — ABNORMAL LOW (ref 8.9–10.3)
Chloride: 87 mmol/L — ABNORMAL LOW (ref 98–111)
Creatinine, Ser: 16.24 mg/dL — ABNORMAL HIGH (ref 0.44–1.00)
GFR calc Af Amer: 3 mL/min — ABNORMAL LOW (ref >60)
GFR calc non Af Amer: 2 mL/min — ABNORMAL LOW (ref >60)
Glucose, Bld: 207 mg/dL — ABNORMAL HIGH (ref 70–99)
Phosphorus: 12.3 mg/dL — ABNORMAL HIGH (ref 2.5–4.6)
Potassium: 4.4 mmol/L (ref 3.5–5.1)
Sodium: 128 mmol/L — ABNORMAL LOW (ref 135–145)

## 2019-04-04 LAB — OCCULT BLOOD X 1 CARD TO LAB, STOOL: Fecal Occult Bld: POSITIVE — AB

## 2019-04-04 LAB — HEMOGLOBIN: Hemoglobin: 11.8 g/dL — ABNORMAL LOW (ref 12.0–15.0)

## 2019-04-04 MED ORDER — CALCITRIOL 0.25 MCG PO CAPS
1.0000 ug | ORAL_CAPSULE | ORAL | Status: DC
  Administered 2019-04-06: 09:00:00 1 ug via ORAL
  Filled 2019-04-04: qty 4

## 2019-04-04 MED ORDER — ONDANSETRON HCL 4 MG/2ML IJ SOLN
4.0000 mg | Freq: Four times a day (QID) | INTRAMUSCULAR | Status: DC | PRN
  Administered 2019-04-04 – 2019-04-07 (×2): 4 mg via INTRAVENOUS
  Filled 2019-04-04 (×2): qty 2

## 2019-04-04 MED ORDER — CHOLESTYRAMINE 4 G PO PACK
4.0000 g | PACK | Freq: Every day | ORAL | Status: DC
  Administered 2019-04-04: 18:00:00 4 g via ORAL
  Filled 2019-04-04 (×2): qty 1

## 2019-04-04 MED ORDER — INSULIN ASPART 100 UNIT/ML ~~LOC~~ SOLN
0.0000 [IU] | Freq: Three times a day (TID) | SUBCUTANEOUS | Status: DC
  Administered 2019-04-04: 18:00:00 5 [IU] via SUBCUTANEOUS
  Administered 2019-04-05: 1 [IU] via SUBCUTANEOUS
  Administered 2019-04-06: 5 [IU] via SUBCUTANEOUS
  Administered 2019-04-07 – 2019-04-08 (×2): 2 [IU] via SUBCUTANEOUS
  Administered 2019-04-08 – 2019-04-10 (×5): 1 [IU] via SUBCUTANEOUS
  Administered 2019-04-11: 19:00:00 2 [IU] via SUBCUTANEOUS
  Administered 2019-04-11 – 2019-04-12 (×4): 3 [IU] via SUBCUTANEOUS
  Administered 2019-04-13 (×2): 2 [IU] via SUBCUTANEOUS
  Filled 2019-04-04 (×17): qty 1

## 2019-04-04 MED ORDER — DOXAZOSIN MESYLATE 4 MG PO TABS
8.0000 mg | ORAL_TABLET | Freq: Every day | ORAL | Status: DC
  Administered 2019-04-05 – 2019-04-08 (×4): 8 mg via ORAL
  Filled 2019-04-04 (×7): qty 2

## 2019-04-04 MED ORDER — RENA-VITE PO TABS
1.0000 | ORAL_TABLET | Freq: Every day | ORAL | Status: DC
  Administered 2019-04-05 – 2019-04-07 (×3): 1 via ORAL
  Filled 2019-04-04 (×4): qty 1

## 2019-04-04 MED ORDER — PANTOPRAZOLE SODIUM 40 MG IV SOLR
40.0000 mg | Freq: Two times a day (BID) | INTRAVENOUS | Status: DC
  Administered 2019-04-04 – 2019-04-13 (×19): 40 mg via INTRAVENOUS
  Filled 2019-04-04 (×20): qty 40

## 2019-04-04 MED ORDER — INSULIN ASPART 100 UNIT/ML ~~LOC~~ SOLN: 0.0000 [IU] | Freq: Every day | SUBCUTANEOUS | Status: DC

## 2019-04-04 MED ORDER — PRO-STAT SUGAR FREE PO LIQD
30.0000 mL | Freq: Three times a day (TID) | ORAL | Status: DC
  Administered 2019-04-04 – 2019-04-13 (×13): 30 mL via ORAL

## 2019-04-04 NOTE — Progress Notes (Signed)
Peritoneal dialysis patient known at [facility], clinic is aware that patient is COVID +. No needed changes at this time due to patient treats at home. Please contact me with any dialysis questions or concerns.  Elvera Bicker Dialysis Coordinator [phone]

## 2019-04-04 NOTE — Progress Notes (Signed)
PT Cancellation Note  Patient Details Name: Janet Mitchell MRN: [id number] DOB: [birth year]   Cancelled Treatment:    Reason Eval/Treat Not Completed: Patient at procedure or test/unavailable Pt getting dialysis this AM, will try back this PM as pt is able.  Kreg Shropshire, DPT 04/04/2019, 10:16 AM

## 2019-04-04 NOTE — Progress Notes (Signed)
[facility]  ROUNDING NOTE   Subjective:  Patient well-known to [place] as we follow her for outpatient peritoneal dialysis. Patient was recently admitted to [facility] for COVID-19 infection. After she went home peritoneal dialysis treatments were not being performed fully. She presented with significant weakness now. Patient has been started on peritoneal dialysis this a.m.   Objective:  Vital signs in last 24 hours:  Temp:  [97.3 F (36.3 C)-97.9 F (36.6 C)] 97.3 F (36.3 C) (01/07 1205) Pulse Rate:  [66-74] 66 (01/07 1205) Resp:  [11-20] 18 (01/07 0629) BP: (123-172)/(78-100) 123/78 (01/07 1205) SpO2:  [96 %-100 %] 96 % (01/07 1205) Weight:  [103.9 kg] 103.9 kg (01/06 1738)  Weight change:  Filed Weights   04/03/19 1738  Weight: 103.9 kg    Intake/Output: No intake/output data recorded.   Intake/Output this shift:  No intake/output data recorded.  Physical Exam: Exam deferred to limit spread of COVID-19 and to preserve PPE.  Basic Metabolic Panel: Recent Labs  Lab 03/29/19 0231 03/30/19 0405 03/30/19 1454 03/31/19 2112 04/02/19 1714 04/03/19 1749 04/04/19 0535  NA 125* 125* 129* 126* 127* 129* 128*  K 3.7 3.4* 3.6 3.9 3.4* 4.2 4.4  CL 86* 84* 90* 85* 86* 87* 87*  CO2 19* 19* 17* 20* 19* 18* 18*  GLUCOSE 441* 500* 218* 292* 209* 200* 207*  BUN 74* 79* 90* 112* 132* 132* 147*  CREATININE 10.53* 10.08* 10.33* 12.32* 14.54* 15.50* 16.24*  CALCIUM 7.6* 7.7* 7.8* 7.6* 7.5* 7.9* 7.6*  MG 1.5* 1.9  --   --   --   --   --   PHOS  --   --   --   --   --   --  12.3*    Liver Function Tests: Recent Labs  Lab 03/29/19 0231 03/30/19 0405 04/02/19 1714 04/04/19 0535  AST 20 13* 12*  --   ALT 10 9 9   --   ALKPHOS 94 92 78  --   BILITOT 0.8 0.6 0.5  --   PROT 6.9 6.4* 6.3*  --   ALBUMIN 1.8* 1.8* 2.2* 2.3*   No results for input(s): LIPASE, AMYLASE in the last 168 hours. No results for input(s): AMMONIA in the last 168 hours.  CBC: Recent  Labs  Lab 03/29/19 0231 03/30/19 0405 03/31/19 2112 04/02/19 1714 04/03/19 1749 04/04/19 0535  WBC 8.5 8.4 6.7 8.3 6.3 7.3  NEUTROABS 7.5 7.5  --  6.2  --   --   HGB 12.1 11.9* 12.6 11.7* 12.1 11.6*  HCT 37.1 36.3 38.2 36.1 36.1 35.5*  MCV 68.7* 68.5* 68.2* 68.2* 66.1* 66.9*  PLT 253 251 287 258 263 237    Cardiac Enzymes: No results for input(s): CKTOTAL, CKMB, CKMBINDEX, TROPONINI in the last 168 hours.  BNP: Invalid input(s): POCBNP  CBG: Recent Labs  Lab 04/02/19 1657 04/03/19 1748 04/03/19 2358 04/04/19 0804 04/04/19 1202  GLUCAP 200* 195* 206* 170* 263*    Microbiology: Results for orders placed or performed during the hospital encounter of 03/25/19  Respiratory Panel by RT PCR (Flu A&B, Covid) - Nasopharyngeal Swab     Status: Abnormal   Collection Time: 03/25/19 11:32 PM   Specimen: Nasopharyngeal Swab  Result Value Ref Range Status   SARS Coronavirus 2 by RT PCR POSITIVE (A) NEGATIVE Final    Comment: RESULT CALLED TO, READ BACK BY AND VERIFIED WITH: EASTER,T. AT 0031 ON 03/26/2019 BY EVA (NOTE) SARS-CoV-2 target nucleic acids are DETECTED. SARS-CoV-2 RNA is  generally detectable in upper respiratory specimens  during the acute phase of infection. Positive results are indicative of the presence of the identified virus, but do not rule out bacterial infection or co-infection with other pathogens not detected by the test. Clinical correlation with patient history and other diagnostic information is necessary to determine patient infection status. The expected result is Negative. Fact Sheet for Patients:  PinkCheek.be Fact Sheet for Healthcare Providers: GravelBags.it This test is not yet approved or cleared by the [place] FDA and  has been authorized for detection and/or diagnosis of SARS-CoV-2 by FDA under an Emergency Use Authorization (EUA).  This EUA will remain in effect (meaning this  test can be used ) for the duration of  the COVID-19 declaration under Section 564(b)(1) of the Act, 21 U.S.C. section 360bbb-3(b)(1), unless the authorization is terminated or revoked sooner.    Influenza A by PCR NEGATIVE NEGATIVE Final   Influenza B by PCR NEGATIVE NEGATIVE Final    Comment: (NOTE) The Xpert Xpress SARS-CoV-2/FLU/RSV assay is intended as an aid in  the diagnosis of influenza from Nasopharyngeal swab specimens and  should not be used as a sole basis for treatment. Nasal washings and  aspirates are unacceptable for Xpert Xpress SARS-CoV-2/FLU/RSV  testing. Fact Sheet for Patients: PinkCheek.be Fact Sheet for Healthcare Providers: GravelBags.it This test is not yet approved or cleared by the [place] FDA and  has been authorized for detection and/or diagnosis of SARS-CoV-2 by  FDA under an Emergency Use Authorization (EUA). This EUA will remain  in effect (meaning this test can be used) for the duration of the  Covid-19 declaration under Section 564(b)(1) of the Act, 21  U.S.C. section 360bbb-3(b)(1), unless the authorization is  terminated or revoked. Performed at [facility], [address] [place], [place] [address]     Coagulation Studies: No results for input(s): LABPROT, INR in the last 72 hours.  Urinalysis: No results for input(s): COLORURINE, LABSPEC, PHURINE, GLUCOSEU, HGBUR, BILIRUBINUR, KETONESUR, PROTEINUR, UROBILINOGEN, NITRITE, LEUKOCYTESUR in the last 72 hours.  Invalid input(s): APPERANCEUR    Imaging: MR BRAIN WO CONTRAST  Result Date: 04/02/2019 CLINICAL DATA:  Slurred speech, weakness, headache EXAM: MRI HEAD WITHOUT CONTRAST TECHNIQUE: Multiplanar, multiecho pulse sequences of the brain and surrounding structures were obtained without intravenous contrast. COMPARISON:  01/11/2019 FINDINGS: Motion artifact is present. Sagittal T1, axial and coronal DWI, and axial T2 sequences  were obtained. Patient could not tolerate remainder of study. Findings below are within this limitation Brain: There is no acute infarction. There is no intracranial mass, mass effect, or edema. There is no hydrocephalus or extra-axial fluid collection. Vascular: Major vessel flow voids at the skull base are preserved. Skull and upper cervical spine: Normal marrow signal is preserved. Sinuses/Orbits: Mild mucosal thickening. Orbits are grossly unremarkable. Other: Sella is unremarkable.  Mastoid air cells are clear. IMPRESSION: Partial, motion degraded study.  No acute infarction. Electronically Signed   By: Macy Mis M.D.   On: 04/02/2019 18:21   DG Chest Portable 1 View  Result Date: 04/02/2019 CLINICAL DATA:  Weakness. EXAM: PORTABLE CHEST 1 VIEW COMPARISON:  Jul 31, 2018 FINDINGS: Low lung volumes are seen which is likely, in part, secondary to suboptimal patient inspiration. There is subsequent vascular crowding. Very mild hazy diffuse bilateral infiltrates are seen. There is no evidence of a pleural effusion or pneumothorax. The heart size and mediastinal contours are within normal limits. Multilevel degenerative changes seen throughout the thoracic spine. IMPRESSION: 1. Low lung volumes  with very mild hazy diffuse bilateral infiltrates. Electronically Signed   By: Virgina Norfolk M.D.   On: 04/02/2019 18:18     Medications:   . dialysis solution 1.5% low-MG/low-CA     . amLODipine  10 mg Oral Daily  . calcitRIOL  0.5 mcg Oral Once per day on Mon Tue Wed Thu Fri  . [START ON 04/06/2019] calcitRIOL  1 mcg Oral Once per day on Sun Sat  . doxazosin  8 mg Oral QHS  . feeding supplement (PRO-STAT SUGAR FREE 64)  30 mL Oral TID  . gentamicin cream  1 application Topical Daily  . insulin aspart  0-5 Units Subcutaneous QHS  . insulin aspart  0-9 Units Subcutaneous TID WC  . insulin glargine  10 Units Subcutaneous QHS  . labetalol  200 mg Oral BID  . losartan  100 mg Oral Daily  . multivitamin   1 tablet Oral QHS  . pantoprazole (PROTONIX) IV  40 mg Intravenous Q12H   acetaminophen **OR** acetaminophen, Ipratropium-Albuterol  Assessment/ Plan:  54 y.o. female with past medical history of ESRD on peritoneal dialysis, anemia of chronic kidney disease, secondary hyperparathyroidism, hypertension, diabetes mellitus type 2, congestive heart failure, recent COVID-19 infection who presents now with weakness and uremic symptoms after having missed multiple peritoneal dialysis treatments.  1.  ESRD on peritoneal dialysis.  The patient has been reinitiated on peritoneal dialysis to treat her underlying uremia.  We may end up having to switch the patient over to hemodialysis if she is unable to more fully complete her peritoneal dialysis treatments.  However we would like to reevaluate her strength after we had a chance to treat her underlying uremia.  2.  Anemia of chronic kidney disease.  Hemoglobin currently 11.6.  Hold off on Epogen for now.  3.  Secondary hyperparathyroidism.  Maintain the patient on calcitriol as prescribed.  Continue to monitor bone mineral metabolism parameters periodically.  4.  Hypertension.  Maintain the patient on amlodipine, doxazosin, labetalol, and losartan for blood pressure control.   LOS: 0 Rico Massar 1/7/202112:43 PM

## 2019-04-04 NOTE — ED Notes (Signed)
Pt sleeping soundly; resp even/unlab with no distress noted

## 2019-04-04 NOTE — Progress Notes (Signed)
Patient ID: Janet Mitchell, female   DOB: [birth year], 54 y.o.   MRN: [id number] [facility] PROGRESS NOTE  Duha Abair Mitchell WUJ:[id number] DOB: 16-Jul-1965 DOA: 04/03/2019 PCP: Lucianne Lei, MD  HPI/Subjective: Patient not feeling well.  Had an episode of black stool.  Patient states that she was recently started on some iron.  Feels a little bit weak.  Patient on continuous peritoneal dialysis currently.  Objective: Vitals:   04/04/19 1052 04/04/19 1205  BP: (!) 144/85 123/78  Pulse: 71 66  Resp:    Temp:  (!) 97.3 F (36.3 C)  SpO2: 98% 96%   No intake or output data in the 24 hours ending 04/04/19 1350 Filed Weights   04/03/19 1738  Weight: 103.9 kg    ROS: Review of Systems  Constitutional: Negative for chills and fever.  Eyes: Negative for blurred vision.  Respiratory: Positive for shortness of breath. Negative for cough.   Cardiovascular: Negative for chest pain.  Gastrointestinal: Positive for abdominal pain and diarrhea. Negative for constipation, nausea and vomiting.  Genitourinary: Negative for dysuria.  Musculoskeletal: Negative for joint pain.  Neurological: Negative for dizziness and headaches.   Exam: Physical Exam  HENT:  Nose: No mucosal edema.  Mouth/Throat: No oropharyngeal exudate or posterior oropharyngeal edema.  Eyes: Conjunctivae and lids are normal.  Neck: Carotid bruit is not present.  Cardiovascular: S1 normal and S2 normal. Exam reveals no gallop.  No murmur heard. Respiratory: She has decreased breath sounds in the right lower field and the left lower field. She has no wheezes. She has no rhonchi. She has no rales.  GI: Soft. Bowel sounds are normal. There is abdominal tenderness in the epigastric area.  Musculoskeletal:     Right ankle: No swelling.     Left ankle: No swelling.  Lymphadenopathy:    She has no cervical adenopathy.  Neurological: She is alert. No cranial nerve deficit.  Skin: Skin is warm. No rash noted. Nails show  no clubbing.  Psychiatric: She has a normal mood and affect.      Data Reviewed: Basic Metabolic Panel: Recent Labs  Lab 03/29/19 0231 03/30/19 0405 03/30/19 1454 03/31/19 2112 04/02/19 1714 04/03/19 1749 04/04/19 0535  NA 125* 125* 129* 126* 127* 129* 128*  K 3.7 3.4* 3.6 3.9 3.4* 4.2 4.4  CL 86* 84* 90* 85* 86* 87* 87*  CO2 19* 19* 17* 20* 19* 18* 18*  GLUCOSE 441* 500* 218* 292* 209* 200* 207*  BUN 74* 79* 90* 112* 132* 132* 147*  CREATININE 10.53* 10.08* 10.33* 12.32* 14.54* 15.50* 16.24*  CALCIUM 7.6* 7.7* 7.8* 7.6* 7.5* 7.9* 7.6*  MG 1.5* 1.9  --   --   --   --   --   PHOS  --   --   --   --   --   --  12.3*   Liver Function Tests: Recent Labs  Lab 03/29/19 0231 03/30/19 0405 04/02/19 1714 04/04/19 0535  AST 20 13* 12*  --   ALT 10 9 9   --   ALKPHOS 94 92 78  --   BILITOT 0.8 0.6 0.5  --   PROT 6.9 6.4* 6.3*  --   ALBUMIN 1.8* 1.8* 2.2* 2.3*   CBC: Recent Labs  Lab 03/29/19 0231 03/30/19 0405 03/31/19 2112 04/02/19 1714 04/03/19 1749 04/04/19 0535 04/04/19 1249  WBC 8.5 8.4 6.7 8.3 6.3 7.3  --   NEUTROABS 7.5 7.5  --  6.2  --   --   --  HGB 12.1 11.9* 12.6 11.7* 12.1 11.6* 11.8*  HCT 37.1 36.3 38.2 36.1 36.1 35.5*  --   MCV 68.7* 68.5* 68.2* 68.2* 66.1* 66.9*  --   PLT 253 251 287 258 263 237  --     CBG: Recent Labs  Lab 04/02/19 1657 04/03/19 1748 04/03/19 2358 04/04/19 0804 04/04/19 1202  GLUCAP 200* 195* 206* 170* 263*    Recent Results (from the past 240 hour(s))  Respiratory Panel by RT PCR (Flu A&B, Covid) - Nasopharyngeal Swab     Status: Abnormal   Collection Time: 03/25/19 11:32 PM   Specimen: Nasopharyngeal Swab  Result Value Ref Range Status   SARS Coronavirus 2 by RT PCR POSITIVE (A) NEGATIVE Final    Comment: RESULT CALLED TO, READ BACK BY AND VERIFIED WITH: EASTER,T. AT 0031 ON 03/26/2019 BY EVA (NOTE) SARS-CoV-2 target nucleic acids are DETECTED. SARS-CoV-2 RNA is generally detectable in upper respiratory  specimens  during the acute phase of infection. Positive results are indicative of the presence of the identified virus, but do not rule out bacterial infection or co-infection with other pathogens not detected by the test. Clinical correlation with patient history and other diagnostic information is necessary to determine patient infection status. The expected result is Negative. Fact Sheet for Patients:  PinkCheek.be Fact Sheet for Healthcare Providers: GravelBags.it This test is not yet approved or cleared by the [place] FDA and  has been authorized for detection and/or diagnosis of SARS-CoV-2 by FDA under an Emergency Use Authorization (EUA).  This EUA will remain in effect (meaning this test can be used ) for the duration of  the COVID-19 declaration under Section 564(b)(1) of the Act, 21 U.S.C. section 360bbb-3(b)(1), unless the authorization is terminated or revoked sooner.    Influenza A by PCR NEGATIVE NEGATIVE Final   Influenza B by PCR NEGATIVE NEGATIVE Final    Comment: (NOTE) The Xpert Xpress SARS-CoV-2/FLU/RSV assay is intended as an aid in  the diagnosis of influenza from Nasopharyngeal swab specimens and  should not be used as a sole basis for treatment. Nasal washings and  aspirates are unacceptable for Xpert Xpress SARS-CoV-2/FLU/RSV  testing. Fact Sheet for Patients: PinkCheek.be Fact Sheet for Healthcare Providers: GravelBags.it This test is not yet approved or cleared by the [place] FDA and  has been authorized for detection and/or diagnosis of SARS-CoV-2 by  FDA under an Emergency Use Authorization (EUA). This EUA will remain  in effect (meaning this test can be used) for the duration of the  Covid-19 declaration under Section 564(b)(1) of the Act, 21  U.S.C. section 360bbb-3(b)(1), unless the authorization is  terminated or  revoked. Performed at [facility], [address], [place], [place] [address]      Studies: MR BRAIN WO CONTRAST  Result Date: 04/02/2019 CLINICAL DATA:  Slurred speech, weakness, headache EXAM: MRI HEAD WITHOUT CONTRAST TECHNIQUE: Multiplanar, multiecho pulse sequences of the brain and surrounding structures were obtained without intravenous contrast. COMPARISON:  01/11/2019 FINDINGS: Motion artifact is present. Sagittal T1, axial and coronal DWI, and axial T2 sequences were obtained. Patient could not tolerate remainder of study. Findings below are within this limitation Brain: There is no acute infarction. There is no intracranial mass, mass effect, or edema. There is no hydrocephalus or extra-axial fluid collection. Vascular: Major vessel flow voids at the skull base are preserved. Skull and upper cervical spine: Normal marrow signal is preserved. Sinuses/Orbits: Mild mucosal thickening. Orbits are grossly unremarkable. Other: Sella is unremarkable.  Mastoid air  cells are clear. IMPRESSION: Partial, motion degraded study.  No acute infarction. Electronically Signed   By: Macy Mis M.D.   On: 04/02/2019 18:21   DG Chest Portable 1 View  Result Date: 04/02/2019 CLINICAL DATA:  Weakness. EXAM: PORTABLE CHEST 1 VIEW COMPARISON:  Jul 31, 2018 FINDINGS: Low lung volumes are seen which is likely, in part, secondary to suboptimal patient inspiration. There is subsequent vascular crowding. Very mild hazy diffuse bilateral infiltrates are seen. There is no evidence of a pleural effusion or pneumothorax. The heart size and mediastinal contours are within normal limits. Multilevel degenerative changes seen throughout the thoracic spine. IMPRESSION: 1. Low lung volumes with very mild hazy diffuse bilateral infiltrates. Electronically Signed   By: Virgina Norfolk M.D.   On: 04/02/2019 18:18    Scheduled Meds: . amLODipine  10 mg Oral Daily  . calcitRIOL  0.5 mcg Oral Once per day on Mon Tue Wed Thu Fri   . [START ON 04/06/2019] calcitRIOL  1 mcg Oral Once per day on Sun Sat  . cholestyramine  4 g Oral q1800  . doxazosin  8 mg Oral QHS  . feeding supplement (PRO-STAT SUGAR FREE 64)  30 mL Oral TID  . gentamicin cream  1 application Topical Daily  . insulin aspart  0-5 Units Subcutaneous QHS  . insulin aspart  0-9 Units Subcutaneous TID WC  . insulin glargine  10 Units Subcutaneous QHS  . labetalol  200 mg Oral BID  . losartan  100 mg Oral Daily  . multivitamin  1 tablet Oral QHS  . pantoprazole (PROTONIX) IV  40 mg Intravenous Q12H   Continuous Infusions: . dialysis solution 1.5% low-MG/low-CA      Assessment/Plan:  1. Acute uremia with end-stage renal disease.  Patient started on continuous peritoneal dialysis as per nephrology.  If patient does not improve may need to do hemodialysis. 2. Black stool.  Unclear if she is bleeding because she was recently started on iron.  Guaiac stool.  Stop aspirin and heparin subcutaneous injections.  Serial hemoglobins.  Last hemoglobin stable from prior hemoglobin.  Started on Protonix IV. 3. Weakness.  MRI of the brain 04/02/2019 - for acute abnormality.  PT consultation 4. Essential hypertension on amlodipine, labetalol losartan and doxazosin 5. Recent COVID-19 infection.  Had 5-day course of remdesivir and steroids.  Patient on room air. 6. Type 2 diabetes mellitus with end-stage renal disease on glargine insulin and sliding scale. 7. Chronic diarrhea since gallbladder was removed.  I will start cholestyramine once a day and increase as tolerated.  Code Status:     Code Status Orders  (From admission, onward)         Start     Ordered   04/03/19 2049  Full code  Continuous     04/03/19 2049        Code Status History    Date Active Date Inactive Code Status Order ID Comments User Context   03/25/2019 2244 03/30/2019 2251 Full Code [id number]  Reubin Milan, MD ED   12/18/2018 0536 12/20/2018 2021 Full Code [id number]  Oswald Hillock, MD  ED   03/25/2018 0224 03/27/2018 1552 Full Code [id number]  Ivor Costa, MD ED   03/09/2018 1634 03/11/2018 1551 Full Code [id number]  Donnie Mesa, MD Inpatient   11/07/2017 0411 11/09/2017 1522 Full Code [id number]  Reubin Milan, MD Inpatient   Advance Care Planning Activity     Family Communication: Spoke with husband on the phone Disposition Plan:  Uremia will need to clear first then will need to check patient strength.  Consultants:  Nephrology  Time spent: 28 minutes  [place]

## 2019-04-04 NOTE — Progress Notes (Signed)
OT Cancellation Note  Patient Details Name: Janet Mitchell MRN: [id number] DOB: [birth year]   Cancelled Treatment:    Reason Eval/Treat Not Completed: Other (comment)  OT consult received and chart reviewed. OT calls room to confer with pt re: completing OT evaluation this PM. Pt is not agreeable at this time stating that she generally feels "weak, tired, and terrible." Pt emphatically requests that occupational therapy evaluation be postponed until tomorrow. Will f/u for OT evaluation tomorrow as appropriate/pt becomes agreeable. Thank you.  Gerrianne Scale, [place], OTR/L ascom [phone] 04/04/19, 3:40 PM

## 2019-04-04 NOTE — ED Notes (Signed)
Pt awakens easily for am labs and med; pt with no c/o at present; warm blankets given at request

## 2019-04-04 NOTE — Progress Notes (Signed)
   04/04/19 0900  Peritoneal Catheter Right lower abdomen  No Placement Date or Time found.   Catheter Location: Right lower abdomen  Site Assessment Clean;Dry;Intact  Drainage Description None  Catheter status Accessed;Draining  Dressing Gauze/Drain sponge  Dressing Status Clean;Dry;Intact  Dressing Intervention Dressing changed  Cycler Setup  Total Number of Exchanges 4  Fill Volume 2000  Dianeal Solution Dextrose 1.5% in 6000 mL  Fill Time - Minute(s) 10  Dwell Time - Hour(s) 10  Drain Time - Minute(s) 20 mins  Pause Option Status Initiated  pt stable no c/os PD exchange initiated

## 2019-04-04 NOTE — ED Notes (Signed)
ED TO INPATIENT HANDOFF REPORT  ED Nurse Name and Phone #: Lattie Haw [phone]  S Name/Age/Gender Janet Mitchell 53 y.o. female Room/Bed: ED03A/ED03A  Code Status   Code Status: Full Code  Home/SNF/Other Home Patient oriented to: self, place, time and situation Is this baseline? Yes   Triage Complete: Triage complete  Chief Complaint ESRD on peritoneal dialysis ([place]) [N18.6, Z99.2]  Triage Note Pt brought in by her husband for weakness, mulitple falls, pt states "im here for stroke sx" when asked what those are pt states slurred speech unable to move different body parts intermittently, pt has clear speech on arrival pt was seen at [facility] [place] for the same complaints 1/3,1/4, 1/5 and then again today.     Allergies No Known Allergies  Level of Care/Admitting Diagnosis ED Disposition    ED Disposition Condition [place] [facility] Area: [place] [[id number]]  Level of Care: Med-Surg [16]  Covid Evaluation: Confirmed COVID Positive  Diagnosis: ESRD on peritoneal dialysis [facility]) [[id number]]  Admitting Physician: Lenore Cordia [[id number]]  Attending Physician: Lenore Cordia [[id number]]       B Medical/Surgery History Past Medical History:  Diagnosis Date  . Anemia   . CHF (congestive heart failure) ([place])   . Chronic cholecystitis with calculus   . ESRD (end stage renal disease) on dialysis [facility])    "peritoneal dialysis q hs" (03/09/2018)  . Headache    "a few/wk" (03/09/2018)  . History of blood transfusion 10/2017   "low blood count" (03/09/2018)  . Hypertension   . Spinal headache   . Type II diabetes mellitus ([place])    Past Surgical History:  Procedure Laterality Date  . AMPUTATION TOE Left 2013   Great toe  . BIOPSY  10/24/2018   Procedure: BIOPSY;  Surgeon: Daneil Dolin, MD;  [facility];  Service: Endoscopy;;  right and left colon  . CATARACT EXTRACTION W/ INTRAOCULAR LENS IMPLANT Right   . CESAREAN SECTION  1994; 1998  .  CHOLECYSTECTOMY N/A 03/09/2018   Procedure: LAPAROSCOPIC CHOLECYSTECTOMY WITH INTRAOPERATIVE CHOLANGIOGRAM ERAS PATHWAY;  Surgeon: Donnie Mesa, MD;  Location: [place];  Service: General;  Laterality: N/A;  . COLONOSCOPY N/A 10/24/2018   Procedure: COLONOSCOPY;  Surgeon: Daneil Dolin, MD;  [facility];  Service: Endoscopy;  Laterality: N/A;  1:00pm  . EYE SURGERY  05/15/2018   Removal of blood in the globe (due to DM)  . FLEXIBLE SIGMOIDOSCOPY N/A 11/24/2017   Procedure: FLEXIBLE SIGMOIDOSCOPY;  Surgeon: Daneil Dolin, MD;  [facility];  Service: Endoscopy;  Laterality: N/A;  . LAPAROSCOPIC CHOLECYSTECTOMY  03/09/2018  . PERITONEAL CATHETER INSERTION  2017  . POLYPECTOMY  10/24/2018   Procedure: POLYPECTOMY;  Surgeon: Daneil Dolin, MD;  [facility];  Service: Endoscopy;;  . TOTAL HIP ARTHROPLASTY Left 1997     A IV Location/Drains/Wounds Patient Lines/Drains/Airways Status   Active Line/Drains/Airways    Name:   Placement date:   Placement time:   Site:   Days:   Peripheral IV 04/03/19 Right Antecubital   04/03/19    2053    Antecubital   1          Intake/Output Last 24 hours No intake or output data in the 24 hours ending 04/04/19 0550  Labs/Imaging Results for orders placed or performed during the hospital encounter of 04/03/19 (from the past 48 hour(s))  Glucose, capillary     Status: Abnormal  Collection Time: 04/03/19  5:48 PM  Result Value Ref Range   Glucose-Capillary 195 (H) 70 - 99 mg/dL   Comment 1 Notify RN    Comment 2 Document in Chart   CBC     Status: Abnormal   Collection Time: 04/03/19  5:49 PM  Result Value Ref Range   WBC 6.3 4.0 - 10.5 K/uL   RBC 5.46 (H) 3.87 - 5.11 MIL/uL   Hemoglobin 12.1 12.0 - 15.0 g/dL   HCT 36.1 36.0 - 46.0 %   MCV 66.1 (L) 80.0 - 100.0 fL   MCH 22.2 (L) 26.0 - 34.0 pg   MCHC 33.5 30.0 - 36.0 g/dL   RDW 20.5 (H) 11.5 - 15.5 %   Platelets 263 150 - 400 K/uL   nRBC 0.0 0.0 - 0.2 %     Comment: Performed at [facility], [address] [place], [place] [address]  Basic metabolic panel     Status: Abnormal   Collection Time: 04/03/19  5:49 PM  Result Value Ref Range   Sodium 129 (L) 135 - 145 mmol/L    Comment: ELECTROLYTES REPEATED.MSS   Potassium 4.2 3.5 - 5.1 mmol/L   Chloride 87 (L) 98 - 111 mmol/L   CO2 18 (L) 22 - 32 mmol/L   Glucose, Bld 200 (H) 70 - 99 mg/dL   BUN 132 (H) 6 - 20 mg/dL    Comment: RESULT CONFIRMED BY MANUAL DILUTION.MSS   Creatinine, Ser 15.50 (H) 0.44 - 1.00 mg/dL   Calcium 7.9 (L) 8.9 - 10.3 mg/dL   GFR calc non Af Amer 2 (L) >60 mL/min   GFR calc Af Amer 3 (L) >60 mL/min   Anion gap 24 (H) 5 - 15    Comment: Performed at [facility], [address] [place], [place] [address]  Hemoglobin A1c     Status: Abnormal   Collection Time: 04/03/19  5:49 PM  Result Value Ref Range   Hgb A1c MFr Bld 8.5 (H) 4.8 - 5.6 %    Comment: (NOTE) Pre diabetes:          5.7%-6.4% Diabetes:              >6.4% Glycemic control for   <7.0% adults with diabetes    Mean Plasma Glucose 197.25 mg/dL    Comment: Performed at [facility] [address], [place],  [address]  Glucose, capillary     Status: Abnormal   Collection Time: 04/03/19 11:58 PM  Result Value Ref Range   Glucose-Capillary 206 (H) 70 - 99 mg/dL   MR BRAIN WO CONTRAST  Result Date: 04/02/2019 CLINICAL DATA:  Slurred speech, weakness, headache EXAM: MRI HEAD WITHOUT CONTRAST TECHNIQUE: Multiplanar, multiecho pulse sequences of the brain and surrounding structures were obtained without intravenous contrast. COMPARISON:  01/11/2019 FINDINGS: Motion artifact is present. Sagittal T1, axial and coronal DWI, and axial T2 sequences were obtained. Patient could not tolerate remainder of study. Findings below are within this limitation Brain: There is no acute infarction. There is no intracranial mass, mass effect, or edema. There is no hydrocephalus or extra-axial  fluid collection. Vascular: Major vessel flow voids at the skull base are preserved. Skull and upper cervical spine: Normal marrow signal is preserved. Sinuses/Orbits: Mild mucosal thickening. Orbits are grossly unremarkable. Other: Sella is unremarkable.  Mastoid air cells are clear. IMPRESSION: Partial, motion degraded study.  No acute infarction. Electronically Signed   By: Macy Mis M.D.   On: 04/02/2019  18:21   DG Chest Portable 1 View  Result Date: 04/02/2019 CLINICAL DATA:  Weakness. EXAM: PORTABLE CHEST 1 VIEW COMPARISON:  Jul 31, 2018 FINDINGS: Low lung volumes are seen which is likely, in part, secondary to suboptimal patient inspiration. There is subsequent vascular crowding. Very mild hazy diffuse bilateral infiltrates are seen. There is no evidence of a pleural effusion or pneumothorax. The heart size and mediastinal contours are within normal limits. Multilevel degenerative changes seen throughout the thoracic spine. IMPRESSION: 1. Low lung volumes with very mild hazy diffuse bilateral infiltrates. Electronically Signed   By: Virgina Norfolk M.D.   On: 04/02/2019 18:18    Pending Labs Unresulted Labs (From admission, onward)    Start     Ordered   04/04/19 0500  CBC  Tomorrow morning,   STAT     04/03/19 2049   04/04/19 0500  Renal function panel  Tomorrow morning,   STAT     04/03/19 2049          Vitals/Pain Today's Vitals   04/04/19 0200 04/04/19 0300 04/04/19 0400 04/04/19 0500  BP: (!) 165/91 (!) 161/88 (!) 155/95 (!) 172/99  Pulse:      Resp: 18 18 16 11   Temp:      TempSrc:      SpO2: 96% 96%  97%  Weight:      Height:      PainSc:    5     Isolation Precautions Airborne and Contact precautions  Medications Medications  heparin injection 5,000 Units (5,000 Units Subcutaneous Given 04/04/19 0541)  acetaminophen (TYLENOL) tablet 650 mg (has no administration in time range)    Or  acetaminophen (TYLENOL) suppository 650 mg (has no administration in time  range)  aspirin EC tablet 81 mg (81 mg Oral Not Given 04/04/19 0239)  calcitRIOL (ROCALTROL) capsule 0.5-1 mcg (has no administration in time range)  Ipratropium-Albuterol (COMBIVENT) respimat 2 puff (has no administration in time range)  labetalol (NORMODYNE) tablet 200 mg (200 mg Oral Not Given 04/04/19 0243)  insulin glargine (LANTUS) injection 10 Units (10 Units Subcutaneous Not Given 04/04/19 0242)  amLODipine (NORVASC) tablet 10 mg (has no administration in time range)  losartan (COZAAR) tablet 100 mg (has no administration in time range)  gentamicin cream (GARAMYCIN) 0.1 % 1 application (has no administration in time range)  dialysis solution 1.5% low-MG/low-CA dianeal solution (has no administration in time range)  doxazosin (CARDURA) tablet 8 mg (8 mg Oral Not Given 04/04/19 0242)    Mobility walks with person assist High fall risk   Focused Assessments    R Recommendations: See Admitting Provider Note  Report given to:   Additional Notes: none

## 2019-04-04 NOTE — Progress Notes (Signed)
PT Cancellation Note  Patient Details Name: Janet Mitchell MRN: [id number] DOB: September 01, 1965   Cancelled Treatment:    Reason Eval/Treat Not Completed: Patient at procedure or test/unavailable Attempted to see again around 14:45, pt still on dialysis.  In talking with OT later this date pt was apparently completely unwilling to participate with her today, will hold PT eval this date and try again tomorrow.    Kreg Shropshire, DPT 04/04/2019, 4:18 PM

## 2019-04-04 NOTE — Progress Notes (Signed)
Initial Nutrition Assessment  DOCUMENTATION CODES:   Obesity unspecified  INTERVENTION:   Prostat liquid protein PO 30 ml TID with meals, each supplement provides 100 kcal, 15 grams protein.  Magic cup TID with meals, each supplement provides 290 kcal and 9 grams of protein  Rena-vite daily   Phosphorus restricted diet   NUTRITION DIAGNOSIS:   Increased nutrient needs related to catabolic illness(COVID 19, ESRD on PD) as evidenced by increased estimated needs.  GOAL:   Patient will meet greater than or equal to 90% of their needs  MONITOR:   PO intake, Supplement acceptance, Labs, Weight trends, Skin, I & O's  REASON FOR ASSESSMENT:   Malnutrition Screening Tool    ASSESSMENT:   54 y.o. female with medical history significant for ESRD on peritoneal dialysis, insulin-dependent type 2 diabetes, hypertension, and recent admission for COVID-19 infection admitted with weakness and slurred speech   Unable to reach pt by phone. Pt is known to nutrition department from recent previous admit. Pt eating 70% of meals prior to her last discharge. Pt is non-compliant with supplements; pt does not like Ensure or Nepro but was eating Magic Cups and taking Prostats. RD will add supplements and vitamins to help pt meet her estimated needs. RD will liberalize pt's diet and add Phosphorus restrictions via heatlh touch. Per chart, pt is down 15lbs(6%) over the past month; this is significant.    Medications reviewed and include: aspirin, calcitriol, heparin, insulin  Labs reviewed: Na 128(L), Cl 87(L), BUN 147(H), creat 16.24(H), P 12.3(H) Hgb 11.6(L), Hct 35.5(L), MCV 66.9(L), MCH 21.8(L) cbgs- 195, 206, 170 x 24 hrs AIC 8.5(H)- 1/6  Unable to complete Nutrition-Focused physical exam at this time as pt with COVID 19.   Diet Order:   Diet Order            Diet renal/carb modified with fluid restriction Diet-HS Snack? Nothing; Fluid restriction: 1200 mL Fluid; Room service appropriate?  Yes; Fluid consistency: Thin  Diet effective now             EDUCATION NEEDS:   Education needs have been addressed  Skin:  Skin Assessment: Reviewed RN Assessment(ecchymosis)  Last BM:  1/6  Height:   Ht Readings from Last 1 Encounters:  04/03/19 5\' 8"  (1.727 m)    Weight:   Wt Readings from Last 1 Encounters:  04/03/19 103.9 kg    Ideal Body Weight:  63.6 kg  BMI:  Body mass index is 34.83 kg/m.  Estimated Nutritional Needs:   Kcal:  2200-2500kcal/day  Protein:  110-125g/day  Fluid:  UOP +1L  Koleen Distance MS, RD, LDN Pager #- [phone] Office#- [phone] After Hours Pager: [phone]

## 2019-04-05 DIAGNOSIS — R531 Weakness: Secondary | ICD-10-CM

## 2019-04-05 DIAGNOSIS — I10 Essential (primary) hypertension: Secondary | ICD-10-CM

## 2019-04-05 DIAGNOSIS — K921 Melena: Secondary | ICD-10-CM

## 2019-04-05 DIAGNOSIS — E1122 Type 2 diabetes mellitus with diabetic chronic kidney disease: Secondary | ICD-10-CM

## 2019-04-05 DIAGNOSIS — R195 Other fecal abnormalities: Secondary | ICD-10-CM

## 2019-04-05 DIAGNOSIS — N19 Unspecified kidney failure: Secondary | ICD-10-CM

## 2019-04-05 LAB — GLUCOSE, CAPILLARY
Glucose-Capillary: 96 mg/dL (ref 70–99)
Glucose-Capillary: 82 mg/dL (ref 70–99)
Glucose-Capillary: 135 mg/dL — ABNORMAL HIGH (ref 70–99)
Glucose-Capillary: 146 mg/dL — ABNORMAL HIGH (ref 70–99)

## 2019-04-05 LAB — HEMOGLOBIN: Hemoglobin: 10.7 g/dL — ABNORMAL LOW (ref 12.0–15.0)

## 2019-04-05 MED ORDER — CHOLESTYRAMINE 4 G PO PACK
4.0000 g | PACK | Freq: Two times a day (BID) | ORAL | Status: DC
  Administered 2019-04-05 – 2019-04-13 (×12): 4 g via ORAL
  Filled 2019-04-05 (×17): qty 1

## 2019-04-05 MED ORDER — CALCIUM ACETATE (PHOS BINDER) 667 MG PO CAPS
2001.0000 mg | ORAL_CAPSULE | Freq: Three times a day (TID) | ORAL | Status: DC
  Administered 2019-04-05 – 2019-04-06 (×3): 2001 mg via ORAL
  Filled 2019-04-05 (×3): qty 3

## 2019-04-05 MED ORDER — LABETALOL HCL 100 MG PO TABS
100.0000 mg | ORAL_TABLET | Freq: Two times a day (BID) | ORAL | Status: DC
  Administered 2019-04-05 – 2019-04-09 (×8): 100 mg via ORAL
  Filled 2019-04-05 (×9): qty 1

## 2019-04-05 NOTE — Progress Notes (Signed)
[facility]  ROUNDING NOTE   Subjective:  Pt tolerating PD well.  Still quite weak.  PD being done during the day.    Objective:  Vital signs in last 24 hours:  Temp:  [97.8 F (36.6 C)-98.1 F (36.7 C)] 97.8 F (36.6 C) (01/08 1141) Pulse Rate:  [71-77] 77 (01/08 1141) Resp:  [16-18] 17 (01/08 1141) BP: (106-115)/(58-71) 106/60 (01/08 1141) SpO2:  [96 %-100 %] 100 % (01/08 1141)  Weight change:  Filed Weights   04/03/19 1738  Weight: 103.9 kg    Intake/Output: I/O last 3 completed shifts: In: 120 [P.O.:120] Out: -    Intake/Output this shift:  No intake/output data recorded.  Physical Exam: Exam deferred to limit spread of COVID-19 and to preserve PPE.  Basic Metabolic Panel: Recent Labs  Lab 03/30/19 0405 03/30/19 1454 03/31/19 2112 04/02/19 1714 04/03/19 1749 04/04/19 0535  NA 125* 129* 126* 127* 129* 128*  K 3.4* 3.6 3.9 3.4* 4.2 4.4  CL 84* 90* 85* 86* 87* 87*  CO2 19* 17* 20* 19* 18* 18*  GLUCOSE 500* 218* 292* 209* 200* 207*  BUN 79* 90* 112* 132* 132* 147*  CREATININE 10.08* 10.33* 12.32* 14.54* 15.50* 16.24*  CALCIUM 7.7* 7.8* 7.6* 7.5* 7.9* 7.6*  MG 1.9  --   --   --   --   --   PHOS  --   --   --   --   --  12.3*    Liver Function Tests: Recent Labs  Lab 03/30/19 0405 04/02/19 1714 04/04/19 0535  AST 13* 12*  --   ALT 9 9  --   ALKPHOS 92 78  --   BILITOT 0.6 0.5  --   PROT 6.4* 6.3*  --   ALBUMIN 1.8* 2.2* 2.3*   No results for input(s): LIPASE, AMYLASE in the last 168 hours. No results for input(s): AMMONIA in the last 168 hours.  CBC: Recent Labs  Lab 03/30/19 0405 03/31/19 2112 04/02/19 1714 04/03/19 1749 04/04/19 0535 04/04/19 1249  WBC 8.4 6.7 8.3 6.3 7.3  --   NEUTROABS 7.5  --  6.2  --   --   --   HGB 11.9* 12.6 11.7* 12.1 11.6* 11.8*  HCT 36.3 38.2 36.1 36.1 35.5*  --   MCV 68.5* 68.2* 68.2* 66.1* 66.9*  --   PLT 251 287 258 263 237  --     Cardiac Enzymes: No results for input(s): CKTOTAL,  CKMB, CKMBINDEX, TROPONINI in the last 168 hours.  BNP: Invalid input(s): POCBNP  CBG: Recent Labs  Lab 04/04/19 1202 04/04/19 1649 04/04/19 2208 04/05/19 0809 04/05/19 1141  GLUCAP 263* 271* 162* 96 82    Microbiology: Results for orders placed or performed during the hospital encounter of 03/25/19  Respiratory Panel by RT PCR (Flu A&B, Covid) - Nasopharyngeal Swab     Status: Abnormal   Collection Time: 03/25/19 11:32 PM   Specimen: Nasopharyngeal Swab  Result Value Ref Range Status   SARS Coronavirus 2 by RT PCR POSITIVE (A) NEGATIVE Final    Comment: RESULT CALLED TO, READ BACK BY AND VERIFIED WITH: EASTER,T. AT 0031 ON 03/26/2019 BY EVA (NOTE) SARS-CoV-2 target nucleic acids are DETECTED. SARS-CoV-2 RNA is generally detectable in upper respiratory specimens  during the acute phase of infection. Positive results are indicative of the presence of the identified virus, but do not rule out bacterial infection or co-infection with other pathogens not detected by the test. Clinical correlation with patient history  and other diagnostic information is necessary to determine patient infection status. The expected result is Negative. Fact Sheet for Patients:  PinkCheek.be Fact Sheet for Healthcare Providers: GravelBags.it This test is not yet approved or cleared by the [place] FDA and  has been authorized for detection and/or diagnosis of SARS-CoV-2 by FDA under an Emergency Use Authorization (EUA).  This EUA will remain in effect (meaning this test can be used ) for the duration of  the COVID-19 declaration under Section 564(b)(1) of the Act, 21 U.S.C. section 360bbb-3(b)(1), unless the authorization is terminated or revoked sooner.    Influenza A by PCR NEGATIVE NEGATIVE Final   Influenza B by PCR NEGATIVE NEGATIVE Final    Comment: (NOTE) The Xpert Xpress SARS-CoV-2/FLU/RSV assay is intended as an aid in   the diagnosis of influenza from Nasopharyngeal swab specimens and  should not be used as a sole basis for treatment. Nasal washings and  aspirates are unacceptable for Xpert Xpress SARS-CoV-2/FLU/RSV  testing. Fact Sheet for Patients: PinkCheek.be Fact Sheet for Healthcare Providers: GravelBags.it This test is not yet approved or cleared by the [place] FDA and  has been authorized for detection and/or diagnosis of SARS-CoV-2 by  FDA under an Emergency Use Authorization (EUA). This EUA will remain  in effect (meaning this test can be used) for the duration of the  Covid-19 declaration under Section 564(b)(1) of the Act, 21  U.S.C. section 360bbb-3(b)(1), unless the authorization is  terminated or revoked. Performed at [facility], [address] [place], [place] [address]     Coagulation Studies: No results for input(s): LABPROT, INR in the last 72 hours.  Urinalysis: No results for input(s): COLORURINE, LABSPEC, PHURINE, GLUCOSEU, HGBUR, BILIRUBINUR, KETONESUR, PROTEINUR, UROBILINOGEN, NITRITE, LEUKOCYTESUR in the last 72 hours.  Invalid input(s): APPERANCEUR    Imaging: No results found.   Medications:   . dialysis solution 1.5% low-MG/low-CA     . amLODipine  10 mg Oral Daily  . calcitRIOL  0.5 mcg Oral Once per day on Mon Tue Wed Thu Fri  . [START ON 04/06/2019] calcitRIOL  1 mcg Oral Once per day on Sun Sat  . cholestyramine  4 g Oral q1800  . doxazosin  8 mg Oral QHS  . feeding supplement (PRO-STAT SUGAR FREE 64)  30 mL Oral TID  . gentamicin cream  1 application Topical Daily  . insulin aspart  0-5 Units Subcutaneous QHS  . insulin aspart  0-9 Units Subcutaneous TID WC  . insulin glargine  10 Units Subcutaneous QHS  . labetalol  200 mg Oral BID  . losartan  100 mg Oral Daily  . multivitamin  1 tablet Oral QHS  . pantoprazole (PROTONIX) IV  40 mg Intravenous Q12H   acetaminophen **OR**  acetaminophen, Ipratropium-Albuterol, ondansetron (ZOFRAN) IV  Assessment/ Plan:  54 y.o. female with past medical history of ESRD on peritoneal dialysis, anemia of chronic kidney disease, secondary hyperparathyroidism, hypertension, diabetes mellitus type 2, congestive heart failure, recent COVID-19 infection who presents now with weakness and uremic symptoms after having missed multiple peritoneal dialysis treatments.  1.  ESRD on peritoneal dialysis.  The patient has been reinitiated on peritoneal dialysis to treat her underlying uremia.  Tolerated PD well, will continue to perform PD over the weekend.    2.  Anemia of chronic kidney disease.  Hgb 11.8, hold off on epogen at this time.   3.  Secondary hyperparathyroidism.  Phos high at 12.3, start on calcium acetate 3 tabs po tid/wm as  calcium low.   4.  Hypertension.  Maintain the patient on amlodipine, doxazosin, labetalol, and losartan for blood pressure control.   LOS: 1 Klynn Linnemann 1/8/20211:28 PM

## 2019-04-05 NOTE — Evaluation (Signed)
Occupational Therapy Evaluation Patient Details Name: Janet Mitchell MRN: [id number] DOB: May 31, 1965 Today's Date: 04/05/2019    History of Present Illness 54 y.o. female with medical history significant for ESRD on peritoneal dialysis, insulin-dependent type 2 diabetes, hypertension, and recent admission for COVID-19 infection 03/25/2019-03/30/2019 who presents to the ED for dialysis needs.  Since d/c she has been to the ED multiple times with general weakness and slurred speech. Imaging has been negative, pt reports she continues to be very weak.   Clinical Impression   Pt was seen for OT evaluation this date. Prior to initial hospital admission, pt was completely Indep with self care and ADL mobility. Most recently, pt with somewhat decreased activity tolerance that has been getting worse and she's required some assist with ADLs from her spouse. Pt lives in [facility] with spouse with 3 STE. Currently pt demonstrates impairments as described below (See OT problem list below) which functionally limit her ability to perform ADL/self-care tasks. Pt currently requires MIN A with sit to stand t/f's and only tolerates stand for ~20 seconds. Pt requiring MOD/MAX A with LB dressing.  Pt would benefit from skilled OT to address noted impairments and functional limitations (see below for any additional details) in order to maximize safety and independence while minimizing falls risk and caregiver burden.  Upon hospital discharge, recommend pt discharge with Home with [place] an d24/7 supv initially for fall prevention/safety.    Follow Up Recommendations  Home health OT;Supervision/Assistance - 24 hour    Equipment Recommendations  3 in 1 bedside commode(grab bars in shower.)    Recommendations for Other Services       Precautions / Restrictions Precautions Precautions: Fall Restrictions Weight Bearing Restrictions: No      Mobility Bed Mobility Overal bed mobility: Needs Assistance Bed Mobility:  Supine to Sit;Sit to Supine     Supine to sit: Min assist Sit to supine: Min assist   General bed mobility comments: extended time required  Transfers Overall transfer level: Needs assistance Equipment used: Rolling walker (2 wheeled) Transfers: Sit to/from Stand Sit to Stand: Min assist;From elevated surface            Balance Overall balance assessment: Needs assistance Sitting-balance support: Single extremity supported Sitting balance-Leahy Scale: Fair Sitting balance - Comments: extended time to scoot to square up     Standing balance-Leahy Scale: Poor Standing balance comment: extended time, tolerates standing only ~20-30 seconds.                           ADL either performed or assessed with clinical judgement   ADL Overall ADL's : Needs assistance/impaired Eating/Feeding: Independent;Sitting   Grooming: Wash/dry hands;Wash/dry face;Oral care;Set up;Sitting           Upper Body Dressing : Set up;Sitting   Lower Body Dressing: Moderate assistance;Maximal assistance;Sitting/lateral leans   Toilet Transfer: Minimal assistance;Stand-pivot;BSC   Toileting- Clothing Manipulation and Hygiene: Moderate assistance;Maximal assistance;Sit to/from stand               Vision Patient Visual Report: No change from baseline       Perception     Praxis      Pertinent Vitals/Pain Pain Assessment: (reports somewhat of a headache)     Hand Dominance Right   Extremity/Trunk Assessment Upper Extremity Assessment Upper Extremity Assessment: RUE deficits/detail;LUE deficits/detail RUE Deficits / Details: R shld 3-/5, elbow 3+/5, grip 3+/5 LUE Deficits / Details: L shld 3+/5, elbow 4/5 an  dgrip 4/5   Lower Extremity Assessment Lower Extremity Assessment: Defer to PT evaluation;Generalized weakness   Cervical / Trunk Assessment Cervical / Trunk Assessment: Normal   Communication Communication Communication: No difficulties   Cognition  Arousal/Alertness: Awake/alert Behavior During Therapy: WFL for tasks assessed/performed Overall Cognitive Status: Within Functional Limits for tasks assessed                                 General Comments: some delayed processing/delayed reponse. Pt A&O, but states she feels disoriented-"off"   General Comments       Exercises Other Exercises Other Exercises: OT facilitates education re: role of OT in acute setting and POC. Pt with moderate reception. Other Exercises: OT facilitates education re: safe hand placement with RW   Shoulder Instructions      Home Living Family/patient expects to be discharged to:: Private residence Living Arrangements: Spouse/significant other Available Help at Discharge: Family Type of Home: House Home Access: Stairs to enter Technical brewer of Steps: 3 Entrance Stairs-Rails: Left Home Layout: One level     Bathroom Shower/Tub: Teacher, early years/pre: Standard     Home Equipment: Tub bench          Prior Functioning/Environment Level of Independence: Independent        Comments: pt has been very weak with almost no ambulation since Covid hospitalization, prior to that able to ambulate and do ADLs w/o assist or AD. States husband has been helping some with LB ADLs since COVID hospitalization        OT Problem List:        OT Treatment/Interventions: Self-care/ADL training;Therapeutic exercise;Energy conservation;DME and/or AE instruction;Therapeutic activities;Patient/family education;Balance training    OT Goals(Current goals can be found in the care plan section) Acute Rehab OT Goals Patient Stated Goal: have more energy to do for herself OT Goal Formulation: With patient Time For Goal Achievement: 04/19/19 Potential to Achieve Goals: Good  OT Frequency: Min 1X/week   Barriers to D/C:            Co-evaluation              AM-PAC OT "6 Clicks" Daily Activity     Outcome Measure  Help from another person eating meals?: None Help from another person taking care of personal grooming?: A Little Help from another person toileting, which includes using toliet, bedpan, or urinal?: A Lot Help from another person bathing (including washing, rinsing, drying)?: A Lot Help from another person to put on and taking off regular upper body clothing?: A Little Help from another person to put on and taking off regular lower body clothing?: A Lot 6 Click Score: 16   End of Session Equipment Utilized During Treatment: Gait belt;Rolling walker  Activity Tolerance: Patient tolerated treatment well;Other (comment)(somewhat limited d/t SOB) Patient left: in bed;with call bell/phone within reach;with bed alarm set(with dialysis staff present and member of lab team drawing blood.)  OT Visit Diagnosis: Unsteadiness on feet (R26.81);Muscle weakness (generalized) (M62.81)                Time: 0109-3235 OT Time Calculation (min): 25 min Charges:  OT General Charges $OT Visit: 1 Visit OT Evaluation $OT Eval Moderate Complexity: 1 Mod OT Treatments $Self Care/Home Management : 8-22 mins  Gerrianne Scale, MS, OTR/L ascom [phone] 04/05/19, 1:58 PM

## 2019-04-05 NOTE — TOC Initial Note (Signed)
Transition of Care [facility]) - Initial/Assessment Note    Patient Details  Name: Janet Mitchell MRN: [id number] Date of Birth: Oct 23, 1965  [facility]) CM/SW Contact:    Beverly Sessions, RN Phone Number: 04/05/2019, 2:23 PM  Clinical Narrative:                 Due to patient being covid positive assessment completed via phone  Patient readmission to hospital.  Covid positive.   States that she lives at home with her husband,  She does not drive, her husband takes her to appointments.   PCP [place]   Denies issues obtaining medications.   PT has assessed patient and recommends SNF.  Patient declines. She is aware that in order to go to go to SNF she would have to transition fro PD to HD, and would also have to go to a facility that is accepting covid patients  Patient agreeable to home health.  Wishes for RNCM to check with [facility] to see if they can accept.  Per [facility] they do not have skilled services, and are PCS services only.  Patient notified.  States she does not have a preference.  Referral made to [facility] with [place]  [facility] and RW delivered to room in anticipation for DC    Expected Discharge Plan: [place] Barriers to Discharge: Continued Medical Work up   Patient Goals and CMS Choice   CMS Medicare.gov Compare Post Acute Care list provided to:: Patient Choice offered to / list presented to : Patient  Expected Discharge Plan and Services Expected Discharge Plan: [place]   Discharge Planning Services: CM Consult Post Acute Care Choice: Home Health                   DME Arranged: 3-N-1, Walker rolling DME Agency: [employer] Date DME Agency Contacted: 04/05/19 Time DME Agency Contacted: 46 Representative spoke with at DME Agency: [place]: RN, PT, OT, Nurse's Aide, Social Work [employer] Agency: [place] ([place]) Date [place]: 04/05/19 Time [place]: 43 Representative spoke with at [place]: [place] Arrangements/Services   Lives with:: Spouse Patient language and need for interpreter reviewed:: Yes Do you feel safe going back to the place where you live?: Yes      Need for Family Participation in Patient Care: Yes (Comment) Care giver support system in place?: Yes (comment)   Criminal Activity/Legal Involvement Pertinent to Current Situation/Hospitalization: No - Comment as needed  Activities of Daily Living Home Assistive Devices/Equipment: Other (Comment), CBG Meter, Blood pressure cuff(dialysis equipment) ADL Screening (condition at time of admission) Patient's cognitive ability adequate to safely complete daily activities?: Yes Is the patient deaf or have difficulty hearing?: No Does the patient have difficulty seeing, even when wearing glasses/contacts?: No Does the patient have difficulty concentrating, remembering, or making decisions?: No Patient able to express need for assistance with ADLs?: Yes Does the patient have difficulty dressing or bathing?: No Independently performs ADLs?: Yes (appropriate for developmental age) Does the patient have difficulty walking or climbing stairs?: Yes Weakness of Legs: Both Weakness of Arms/Hands: None  Permission Sought/Granted                  Emotional Assessment       Orientation: : Oriented to Self, Oriented to Place, Oriented to  Time, Oriented to Situation   Psych  Involvement: No (comment)  Admission diagnosis:  Hyponatremia [E87.1] Elevated BUN [R79.9] ESRD (end stage renal disease) ([place]) [N18.6] ESRD on peritoneal dialysis ([place]) [N18.6, Z99.2] COVID-19 [U07.1] Black stool [K92.1] Patient Active Problem List   Diagnosis Date Noted  . Black stool 04/04/2019  . Uremia, acute   . Weakness   . COVID-19 03/26/2019  . Chronic diarrhea 03/25/2019  . Type II diabetes mellitus ([place])   . Hypokalemia    . Hyponatremia   . Anemia in ESRD (end-stage renal disease) ([place])   . Essential hypertension, benign 02/14/2019  . Class 2 severe obesity due to excess calories with serious comorbidity and body mass index (BMI) of 37.0 to 37.9 in adult [facility]) 02/14/2019  . SBP (spontaneous bacterial peritonitis) ([place]) 12/18/2018  . Abdominal pain 11/28/2018  . Rectal burning 08/27/2018  . Rectal bleeding 08/27/2018  . Diarrhea 05/22/2018  . Fluid overload 03/25/2018  . Microcytic anemia 03/25/2018  . Chronic cholecystitis with calculus 03/09/2018  . Type 2 diabetes mellitus with ESRD (end-stage renal disease) ([place]) 03/09/2018  . Pre-transplant evaluation for ESRD (end stage renal disease) 12/19/2017  . Symptomatic anemia 11/06/2017  . Essential hypertension 11/06/2017  . ESRD on peritoneal dialysis ([place]) 11/06/2017  . Diabetic macular edema ([place]) 04/10/2015  . Edema of lower extremity 04/10/2015  . Uncontrolled type 2 diabetes mellitus ([place]) 04/10/2015   PCP:  Lucianne Lei, MD Pharmacy:   [place], [place] [address] Phone: [phone] Fax: [phone]     Social Determinants of Health (SDOH) Interventions    Readmission Risk Interventions Readmission Risk Prevention Plan 04/05/2019  Transportation Screening Complete  Medication Review (RN Care Manager) Complete  [place] Patient Refused  Some recent data might be hidden

## 2019-04-05 NOTE — TOC Initial Note (Signed)
Transition of Care [facility]) - Initial/Assessment Note    Patient Details  Name: Janet Mitchell MRN: [id number] Date of Birth: Oct 17, 1965  Transition of Care [facility]) CM/SW Contact:    Beverly Sessions, RN Phone Number: 04/05/2019, 10:49 AM  Clinical Narrative:                 Due to patient being covid positive attempted to complete assessment for extreme risk for readmission by phone.  [employer].  Will attempt at later time         Patient Goals and CMS Choice        Expected Discharge Plan and Services                                                Prior Living Arrangements/Services                       Activities of Daily Living Home Assistive Devices/Equipment: Other (Comment), CBG Meter, Blood pressure cuff(dialysis equipment) ADL Screening (condition at time of admission) Patient's cognitive ability adequate to safely complete daily activities?: Yes Is the patient deaf or have difficulty hearing?: No Does the patient have difficulty seeing, even when wearing glasses/contacts?: No Does the patient have difficulty concentrating, remembering, or making decisions?: No Patient able to express need for assistance with ADLs?: Yes Does the patient have difficulty dressing or bathing?: No Independently performs ADLs?: Yes (appropriate for developmental age) Does the patient have difficulty walking or climbing stairs?: Yes Weakness of Legs: Both Weakness of Arms/Hands: None  Permission Sought/Granted                  Emotional Assessment              Admission diagnosis:  Hyponatremia [E87.1] Elevated BUN [R79.9] ESRD (end stage renal disease) ([place]) [N18.6] ESRD on peritoneal dialysis ([place]) [N18.6, Z99.2] COVID-19 [U07.1] Black stool [K92.1] Patient Active Problem List   Diagnosis Date Noted  . Black stool 04/04/2019  . Uremia, acute   . Weakness   . COVID-19 03/26/2019  . Chronic diarrhea 03/25/2019  . Type II diabetes mellitus  ([place])   . Hypokalemia   . Hyponatremia   . Anemia in ESRD (end-stage renal disease) ([place])   . Essential hypertension, benign 02/14/2019  . Class 2 severe obesity due to excess calories with serious comorbidity and body mass index (BMI) of 37.0 to 37.9 in adult [facility]) 02/14/2019  . SBP (spontaneous bacterial peritonitis) ([place]) 12/18/2018  . Abdominal pain 11/28/2018  . Rectal burning 08/27/2018  . Rectal bleeding 08/27/2018  . Diarrhea 05/22/2018  . Fluid overload 03/25/2018  . Microcytic anemia 03/25/2018  . Chronic cholecystitis with calculus 03/09/2018  . Type 2 diabetes mellitus with ESRD (end-stage renal disease) ([place]) 03/09/2018  . Pre-transplant evaluation for ESRD (end stage renal disease) 12/19/2017  . Symptomatic anemia 11/06/2017  . Essential hypertension 11/06/2017  . ESRD on peritoneal dialysis ([place]) 11/06/2017  . Diabetic macular edema ([place]) 04/10/2015  . Edema of lower extremity 04/10/2015  . Uncontrolled type 2 diabetes mellitus ([place]) 04/10/2015   PCP:  Lucianne Lei, MD Pharmacy:   [facility], [address] [place] [address] Phone: [phone] Fax: [phone]     Social Determinants of Health (SDOH) Interventions    Readmission Risk  Interventions No flowsheet data found.

## 2019-04-05 NOTE — Evaluation (Signed)
Physical Therapy Evaluation Patient Details Name: Janet Mitchell MRN: [id number] DOB: Oct 27, 1965 Today's Date: 04/05/2019   History of Present Illness  54 y.o. female with medical history significant for ESRD on peritoneal dialysis, insulin-dependent type 2 diabetes, hypertension, and recent admission for COVID-19 infection 03/25/2019-03/30/2019 who presents to the ED for dialysis needs.  Since d/c she has been to the ED multiple times with general weakness and slurred speech. Imaging has been negative, pt reports she continues to be very weak.  Clinical Impression  Pt anxious about how weak she was and this proved to be justified with significant buckling in trying trying to do minimal ambulation with walker (does not use AD at baseline).  She needed total assist to keep from falling and get back into the recliner.  Pt with reasonable strength with MMT but showed very little functional strength/tolerance - needing assist to get to standing and as stated above buckling and nearly falling after just a few steps.  Pt's resting O2 on room air was int he low 90s, dropped to mid 80s with light activity, did return quickly to 90s sitting in recliner after "ambulation."  Pt knows that she is not safe to return home at this time, per improvement while hospitalized she will need STR.     Follow Up Recommendations SNF(per continued progress hopes to go home)    Equipment Recommendations  Rolling walker with 5" wheels;3in1 (PT)(if pt is to go home)    Recommendations for Other Services       Precautions / Restrictions Precautions Precautions: Fall Restrictions Weight Bearing Restrictions: No      Mobility  Bed Mobility Overal bed mobility: Needs Assistance Bed Mobility: Supine to Sit     Supine to sit: Min assist     General bed mobility comments: Pt slow and labored in getting to sitting, needed only very minimal direct assist but needed considerable time sitting EOB to "recover" and get  feeling well enough to continue activity  Transfers Overall transfer level: Needs assistance Equipment used: Rolling walker (2 wheeled) Transfers: Sit to/from Stand Sit to Stand: Min assist         General transfer comment: first attempt to stand (from low bed setting) was unsuccessful pt unable to fully push up.  Raised bed ~3" and provided min assist and she was able to rise to standing, heavy reliance on UEs/walker to maintain standing.  Ambulation/Gait Ambulation/Gait assistance: Total assist Gait Distance (Feet): 5 Feet Assistive device: Rolling walker (2 wheeled)       General Gait Details: Pt was able to take a few small turning steps to get square to walker, with attempt to ambulate she took ~2 small "safe" steps and then started to have uncontrolled stepping and then knee buckling needing total assist to keep her upright and get her into the recliner.  Pt extremely weak with the effort and completely unsafe at this time to ambulate any distance.  Stairs            Wheelchair Mobility    Modified Rankin (Stroke Patients Only)       Balance Overall balance assessment: Needs assistance Sitting-balance support: Single extremity supported Sitting balance-Leahy Scale: Fair Sitting balance - Comments: Pt needing extra time at EOB just to rest/reset and "gather her energy."  Able to stay upright but limited tolerance.     Standing balance-Leahy Scale: Poor Standing balance comment: Pt was able to rise to standing and cautiously maintain static standing, however she had uncontrolled  buckling with dynamic standing tasks                             Pertinent Vitals/Pain Pain Assessment: (reports vague head ache/nausea/abdominal pain )    Home Living Family/patient expects to be discharged to:: Private residence Living Arrangements: Spouse/significant other Available Help at Discharge: Family Type of Home: House Home Access: Stairs to enter Entrance  Stairs-Rails: Left Entrance Stairs-Number of Steps: 3 Home Layout: One level Home Equipment: None      Prior Function Level of Independence: Independent         Comments: pt has been very weak with almost no ambulation since Covid hospitalization, prior to that able to ambulate and do ADLs w/o assist or AD     Hand Dominance        Extremity/Trunk Assessment   Upper Extremity Assessment Upper Extremity Assessment: Generalized weakness(R grossly 3/5, L grossly 3+/5)    Lower Extremity Assessment Lower Extremity Assessment: Generalized weakness(reasonable strength with testing, grossly 4-/5 b/l)       Communication   Communication: No difficulties  Cognition Arousal/Alertness: Awake/alert Behavior During Therapy: WFL for tasks assessed/performed Overall Cognitive Status: Within Functional Limits for tasks assessed                                        General Comments      Exercises     Assessment/Plan    PT Assessment Patient needs continued PT services  PT Problem List Decreased strength;Decreased range of motion;Decreased activity tolerance;Decreased balance;Decreased mobility;Decreased knowledge of use of DME;Decreased safety awareness       PT Treatment Interventions DME instruction;Gait training;Stair training;Functional mobility training;Therapeutic activities;Therapeutic exercise;Balance training;Neuromuscular re-education;Patient/family education    PT Goals (Current goals can be found in the Care Plan section)  Acute Rehab PT Goals Patient Stated Goal: get her energy back PT Goal Formulation: With patient Time For Goal Achievement: 04/19/19 Potential to Achieve Goals: Fair    Frequency Min 2X/week   Barriers to discharge        Co-evaluation               AM-PAC PT "6 Clicks" Mobility  Outcome Measure Help needed turning from your back to your side while in a flat bed without using bedrails?: A Little Help needed  moving from lying on your back to sitting on the side of a flat bed without using bedrails?: A Little Help needed moving to and from a bed to a chair (including a wheelchair)?: A Lot Help needed standing up from a chair using your arms (e.g., wheelchair or bedside chair)?: A Little Help needed to walk in hospital room?: Total Help needed climbing 3-5 steps with a railing? : Total 6 Click Score: 13    End of Session Equipment Utilized During Treatment: Gait belt Activity Tolerance: Patient limited by fatigue Patient left: with chair alarm set;with call bell/phone within reach Nurse Communication: Mobility status(O2 in the low 90s at rest, mid 80s with activity - on RA t/o) PT Visit Diagnosis: Muscle weakness (generalized) (M62.81);Difficulty in walking, not elsewhere classified (R26.2);Unsteadiness on feet (R26.81);Repeated falls (R29.6)    Time: 0902-1000 PT Time Calculation (min) (ACUTE ONLY): 58 min   Charges:   PT Evaluation $PT Eval Moderate Complexity: 1 Mod PT Treatments $Gait Training: 8-22 mins $Therapeutic Activity: 8-22 mins  Kreg Shropshire, DPT 04/05/2019, 10:48 AM

## 2019-04-05 NOTE — Progress Notes (Signed)
Patient ID: Evey Mcmahan Pinnix, female   DOB: 01-07-1966, 54 y.o.   MRN: [id number] [facility] PROGRESS NOTE  Iman Reinertsen Pinnix XIP:[id number] DOB: 08/30/1965 DOA: 04/03/2019 PCP: Lucianne Lei, MD  HPI/Subjective: Patient not feeling well.  Try to work with physical therapy and almost passed out.  Having some lower abdominal pain.  Still having some diarrhea which is dark in nature.  Still with some shortness of breath.  Objective: Vitals:   04/05/19 0546 04/05/19 1141  BP: (!) 109/58 106/60  Pulse: 76 77  Resp: 16 17  Temp: 97.9 F (36.6 C) 97.8 F (36.6 C)  SpO2: 96% 100%    Intake/Output Summary (Last 24 hours) at 04/05/2019 1425 Last data filed at 04/04/2019 2030 Gross per 24 hour  Intake 120 ml  Output --  Net 120 ml   Filed Weights   04/03/19 1738  Weight: 103.9 kg    ROS: Review of Systems  Constitutional: Negative for chills and fever.  Eyes: Negative for blurred vision.  Respiratory: Positive for shortness of breath. Negative for cough.   Cardiovascular: Negative for chest pain.  Gastrointestinal: Positive for abdominal pain and diarrhea. Negative for constipation, nausea and vomiting.  Genitourinary: Negative for dysuria.  Musculoskeletal: Negative for joint pain.  Neurological: Negative for dizziness and headaches.   Exam: Physical Exam  HENT:  Nose: No mucosal edema.  Mouth/Throat: No oropharyngeal exudate or posterior oropharyngeal edema.  Eyes: Conjunctivae and lids are normal.  Neck: Carotid bruit is not present.  Cardiovascular: S1 normal and S2 normal. Exam reveals no gallop.  No murmur heard. Respiratory: She has decreased breath sounds in the right lower field and the left lower field. She has no wheezes. She has no rhonchi. She has no rales.  GI: Soft. Bowel sounds are normal. There is abdominal tenderness in the right lower quadrant, suprapubic area and left lower quadrant.  Musculoskeletal:     Right ankle: No swelling.     Left ankle: No  swelling.  Lymphadenopathy:    She has no cervical adenopathy.  Neurological: She is alert. No cranial nerve deficit.  Skin: Skin is warm. No rash noted. Nails show no clubbing.  Psychiatric: She has a normal mood and affect.      Data Reviewed: Basic Metabolic Panel: Recent Labs  Lab 03/30/19 0405 03/30/19 1454 03/31/19 2112 04/02/19 1714 04/03/19 1749 04/04/19 0535  NA 125* 129* 126* 127* 129* 128*  K 3.4* 3.6 3.9 3.4* 4.2 4.4  CL 84* 90* 85* 86* 87* 87*  CO2 19* 17* 20* 19* 18* 18*  GLUCOSE 500* 218* 292* 209* 200* 207*  BUN 79* 90* 112* 132* 132* 147*  CREATININE 10.08* 10.33* 12.32* 14.54* 15.50* 16.24*  CALCIUM 7.7* 7.8* 7.6* 7.5* 7.9* 7.6*  MG 1.9  --   --   --   --   --   PHOS  --   --   --   --   --  12.3*   Liver Function Tests: Recent Labs  Lab 03/30/19 0405 04/02/19 1714 04/04/19 0535  AST 13* 12*  --   ALT 9 9  --   ALKPHOS 92 78  --   BILITOT 0.6 0.5  --   PROT 6.4* 6.3*  --   ALBUMIN 1.8* 2.2* 2.3*   CBC: Recent Labs  Lab 03/30/19 0405 03/31/19 2112 04/02/19 1714 04/03/19 1749 04/04/19 0535 04/04/19 1249 04/05/19 1306  WBC 8.4 6.7 8.3 6.3 7.3  --   --   NEUTROABS 7.5  --  6.2  --   --   --   --   HGB 11.9* 12.6 11.7* 12.1 11.6* 11.8* 10.7*  HCT 36.3 38.2 36.1 36.1 35.5*  --   --   MCV 68.5* 68.2* 68.2* 66.1* 66.9*  --   --   PLT 251 287 258 263 237  --   --     CBG: Recent Labs  Lab 04/04/19 1202 04/04/19 1649 04/04/19 2208 04/05/19 0809 04/05/19 1141  GLUCAP 263* 271* 162* 96 82     Scheduled Meds: . amLODipine  10 mg Oral Daily  . calcitRIOL  0.5 mcg Oral Once per day on Mon Tue Wed Thu Fri  . [START ON 04/06/2019] calcitRIOL  1 mcg Oral Once per day on Sun Sat  . calcium acetate  2,001 mg Oral TID WC  . cholestyramine  4 g Oral BID  . doxazosin  8 mg Oral QHS  . feeding supplement (PRO-STAT SUGAR FREE 64)  30 mL Oral TID  . gentamicin cream  1 application Topical Daily  . insulin aspart  0-5 Units Subcutaneous QHS  .  insulin aspart  0-9 Units Subcutaneous TID WC  . insulin glargine  10 Units Subcutaneous QHS  . labetalol  200 mg Oral BID  . losartan  100 mg Oral Daily  . multivitamin  1 tablet Oral QHS  . pantoprazole (PROTONIX) IV  40 mg Intravenous Q12H   Continuous Infusions: . dialysis solution 1.5% low-MG/low-CA      Assessment/Plan:  1. Acute uremia with end-stage renal disease.  Peritoneal dialysis through the weekend as per nephrology. 2. Black stool that is guaiac positive.  Aspirin stopped.  Heparin subcu stopped.  On IV Protonix.  Case discussed with gastroenterology and okay with the patient eating because the hemoglobin is relatively stable. 3. Weakness.  MRI of the brain 04/02/2019 - for acute abnormality.  Patient very weak with physical therapy. 4. Essential hypertension on amlodipine, labetalol,  losartan and doxazosin.  Check orthostatic vital signs 5. Recent COVID-19 infection.  Had 5-day course of remdesivir and steroids.  Patient on room air. 6. Type 2 diabetes mellitus with end-stage renal disease on glargine insulin and sliding scale. 7. Chronic diarrhea since gallbladder was removed.  Increase cholestyramine to 4 g twice daily  Code Status:     Code Status Orders  (From admission, onward)         Start     Ordered   04/03/19 2049  Full code  Continuous     04/03/19 2049        Code Status History    Date Active Date Inactive Code Status Order ID Comments User Context   03/25/2019 2244 03/30/2019 2251 Full Code [id number]  Reubin Milan, MD ED   12/18/2018 0536 12/20/2018 2021 Full Code [id number]  Oswald Hillock, MD ED   03/25/2018 0224 03/27/2018 1552 Full Code [id number]  Ivor Costa, MD ED   03/09/2018 1634 03/11/2018 1551 Full Code [id number]  Donnie Mesa, MD Inpatient   11/07/2017 0411 11/09/2017 1522 Full Code [id number]  Reubin Milan, MD Inpatient   Advance Care Planning Activity     Family Communication: Spoke with husband on the phone Disposition  Plan: Peritoneal dialysis through the weekend as per nephrology  Consultants:  Nephrology  Time spent: 27 minutes, case discussed with GI  Loletha Grayer  [facility]

## 2019-04-05 NOTE — Progress Notes (Signed)
Pd started 

## 2019-04-05 NOTE — Progress Notes (Signed)
Patient ID: Janet Mitchell, female   DOB: [birth year], 54 y.o.   MRN: [id number]  Still not seeing my morning labs.  Order a stat hemoglobin.

## 2019-04-05 NOTE — Consult Note (Addendum)
Janet Antigua, MD [address], [place], [place], [place], [address] [id number], [place], [place], [place], [address] Phone: [phone]  Fax: [phone]  Consultation  Referring Provider:     Dr. Earleen Newport  Primary Care Physician:  Lucianne Lei, MD Reason for Consultation:     Guaic positive black stool Primary gastroenterologist: [facility]  Date of Admission:  04/03/2019 Date of Consultation:  04/05/2019         HPI:   Janet Mitchell is a 54 y.o. female admitted with acute uremia with end-stage renal disease due to missing peritoneal dialysis at home.  GI being consulted for guaiac positive stool.  Patient reports chronic history of loose stools, with multiple stools a day every time she eats.  States these are usually brown in her urine color.  As of recently, when I inquired if she has had any black stool, she states stool has been dark but thinks that it has been dark brown and not black particularly.  No hematemesis.  No prior upper endoscopy  She has had prior work-up with GI for chronic loose stools and has had colonoscopy recently.  As of their last note they mention the patient is postcholecystectomy.  Colonoscopy July 2020 with 15 mm transverse colon polyp and random colon biopsies done.  Pathology found to have tubular adenoma of the polyp, and biopsies negative for any pathological changes.  Past Medical History:  Diagnosis Date  . Anemia   . CHF (congestive heart failure) ([place])   . Chronic cholecystitis with calculus   . ESRD (end stage renal disease) on dialysis [facility])    "peritoneal dialysis q hs" (03/09/2018)  . Headache    "a few/wk" (03/09/2018)  . History of blood transfusion 10/2017   "low blood count" (03/09/2018)  . Hypertension   . Spinal headache   . Type II diabetes mellitus ([place])     Past Surgical History:  Procedure Laterality Date  . AMPUTATION TOE Left 2013   Great toe  . BIOPSY  10/24/2018   Procedure: BIOPSY;   Surgeon: Daneil Dolin, MD;  [facility];  Service: Endoscopy;;  right and left colon  . CATARACT EXTRACTION W/ INTRAOCULAR LENS IMPLANT Right   . CESAREAN SECTION  1994; 1998  . CHOLECYSTECTOMY N/A 03/09/2018   Procedure: LAPAROSCOPIC CHOLECYSTECTOMY WITH INTRAOPERATIVE CHOLANGIOGRAM ERAS PATHWAY;  Surgeon: Donnie Mesa, MD;  Location: [facility];  Service: General;  Laterality: N/A;  . COLONOSCOPY N/A 10/24/2018   Procedure: COLONOSCOPY;  Surgeon: Daneil Dolin, MD;  [facility];  Service: Endoscopy;  Laterality: N/A;  1:00pm  . EYE SURGERY  05/15/2018   Removal of blood in the globe (due to DM)  . FLEXIBLE SIGMOIDOSCOPY N/A 11/24/2017   Procedure: FLEXIBLE SIGMOIDOSCOPY;  Surgeon: Daneil Dolin, MD;  [facility];  Service: Endoscopy;  Laterality: N/A;  . LAPAROSCOPIC CHOLECYSTECTOMY  03/09/2018  . PERITONEAL CATHETER INSERTION  2017  . POLYPECTOMY  10/24/2018   Procedure: POLYPECTOMY;  Surgeon: Daneil Dolin, MD;  [facility];  Service: Endoscopy;;  . TOTAL HIP ARTHROPLASTY Left 1997    Prior to Admission medications   Medication Sig Start Date End Date Taking? Authorizing Provider  ALPRAZolam Duanne Moron) 0.5 MG tablet Take 0.5 mg by mouth at bedtime as needed for anxiety.   Yes [provider]  amLODipine (NORVASC) 10 MG tablet Take 1 tablet (10 mg total) by mouth daily. 11/09/17  Yes Roxan Hockey, MD  ascorbic acid (VITAMIN C) 500 MG tablet Take 1 tablet (500 mg total) by mouth daily. 03/31/19  Yes Oswald Hillock, MD  aspirin EC 81 MG tablet Take 81 mg by mouth daily.   Yes [provider]  calcitRIOL (ROCALTROL) 0.5 MCG capsule Take 0.5-1 mcg by mouth See admin instructions. 0.75mg on Mondays through Fridays and take 134m on Saturdays and Sundays   Yes [provider]  Crisaborole (EUCRISA) 2 % OINT Apply 1 application topically daily as needed (for rash/irritation).   Yes [provider]  doxazosin  (CARDURA) 8 MG tablet Take 8 mg by mouth at bedtime.   Yes [provider]  Hyoscyamine Sulfate SL (LEVSIN/SL) 0.125 MG SUBL Place 0.125 mg under the tongue 3 (three) times daily as needed (diarrhea). 03/19/19  Yes GiCarlis StableNP  Insulin Glargine, 2 Unit Dial, 300 UNIT/ML SOPN Inject 20 Units into the skin at bedtime. 02/28/19  Yes Nida, GeMarella ChimesMD  Ipratropium-Albuterol (COMBIVENT) 20-100 MCG/ACT AERS respimat Inhale 2 puffs into the lungs every 6 (six) hours as needed for wheezing or shortness of breath. 03/30/19  Yes LaOswald HillockMD  labetalol (NORMODYNE) 200 MG tablet Take 200 mg by mouth 2 (two) times daily.   Yes [provider]  losartan (COZAAR) 100 MG tablet Take 100 mg by mouth daily.   Yes [provider]  zinc sulfate 220 (50 Zn) MG capsule Take 1 capsule (220 mg total) by mouth daily. 03/31/19  Yes LaOswald HillockMD  Blood Glucose Monitoring Suppl (ACCU-CHEK GUIDE) w/Device KIT 1 Piece by Does not apply route as directed. Patient not taking: Reported on 03/26/2019 02/28/19   NiCassandria AngerMD  glucose blood (ACCU-CHEK GUIDE) test strip Use as instructed 02/28/19   NiCassandria AngerMD    Family History  Problem Relation Age of Onset  . Heart disease Mother   . Thrombocytopenia Mother        TTP  . Heart failure Father   . Kidney disease Paternal Grandfather   . Colon cancer Neg Hx      Social History   Tobacco Use  . Smoking status: Passive Smoke Exposure - Never Smoker  . Smokeless tobacco: Never Used  Substance Use Topics  . Alcohol use: Never  . Drug use: Never    Allergies as of 04/03/2019  . (No Known Allergies)    Review of Systems:    All systems reviewed and negative except where noted in HPI.   Physical Exam:  Vital signs in last 24 hours: Vitals:   04/05/19 0546 04/05/19 1141 04/05/19 1540 04/05/19 1541  BP: (!) 109/58 106/60 134/69 116/63  Pulse: 76 77 79 79  Resp: 16 17    Temp: 97.9 F (36.6 C)  97.8 F (36.6 C)    TempSrc: Oral Oral    SpO2: 96% 100%    Weight:      Height:       Last BM Date: 04/04/19 General:   Pleasant, cooperative in NAD Head:  Normocephalic and atraumatic. Eyes:   No icterus.   Conjunctiva pink. PERRLA. Ears:  Normal auditory acuity. Neck:  Supple; no masses or thyroidomegaly Lungs: Respirations even and unlabored. Lungs clear to auscultation bilaterally.   No wheezes, crackles, or rhonchi.  Abdomen:  Soft, nondistended, nontender. Normal bowel sounds. No appreciable masses or hepatomegaly.  No rebound or guarding.  Neurologic:  Alert and oriented x3;  grossly normal neurologically. Skin:  Intact without significant lesions  or rashes. Cervical Nodes:  No significant cervical adenopathy. Psych:  Alert and cooperative. Normal affect.  LAB RESULTS: Recent Labs    04/02/19 1714 04/03/19 1749 04/04/19 0535 04/04/19 1249 04/05/19 1306  WBC 8.3 6.3 7.3  --   --   HGB 11.7* 12.1 11.6* 11.8* 10.7*  HCT 36.1 36.1 35.5*  --   --   PLT 258 263 237  --   --    BMET Recent Labs    04/02/19 1714 04/03/19 1749 04/04/19 0535  NA 127* 129* 128*  K 3.4* 4.2 4.4  CL 86* 87* 87*  CO2 19* 18* 18*  GLUCOSE 209* 200* 207*  BUN 132* 132* 147*  CREATININE 14.54* 15.50* 16.24*  CALCIUM 7.5* 7.9* 7.6*   LFT Recent Labs    04/02/19 1714 04/04/19 0535  PROT 6.3*  --   ALBUMIN 2.2* 2.3*  AST 12*  --   ALT 9  --   ALKPHOS 78  --   BILITOT 0.5  --    PT/INR No results for input(s): LABPROT, INR in the last 72 hours.  STUDIES: No results found.    Impression / Plan:   Janet Mitchell is a 54 y.o. y/o female with acute uremia due to missing peritoneal dialysis at home, with GI being consulted for guaiac positive stool  Patient reports chronic history of loose stools and follows with [facility] for this who have done previous work-up for the same including colonoscopy  Her hemoglobin is at baseline and she has chronic microcytic  anemia  Patient reports her stool has been dark but describes it as dark brown more than black  Guaiac positive test is not a good test for hemodynamically significant GI bleed.  In addition, given her recent colonoscopy, risk of malignancy is low  In the presence of a very stable hemoglobin compared to baseline, and acute uremia and lab abnormalities, endoscopic procedures would have higher risks than benefits  Ferritin acutely elevated on January 1, likely due to acute phase reactant  Upper endoscopy can be considered as an inpatient or outpatient depending on clinical status and improvement in renal function if needed  Continue PPI twice daily  Continue CBC once daily  Avoid NSAIDs  Please page GI with any signs of active GI bleeding  Dr. Marius Ditch will be following the patient after today  Thank you for involving me in the care of this patient.      LOS: 1 day   Virgel Manifold, MD  04/05/2019, 4:06 PM

## 2019-04-06 LAB — BASIC METABOLIC PANEL
Anion gap: 22 — ABNORMAL HIGH (ref 5–15)
BUN: 128 mg/dL — ABNORMAL HIGH (ref 6–20)
CO2: 19 mmol/L — ABNORMAL LOW (ref 22–32)
Calcium: 7.1 mg/dL — ABNORMAL LOW (ref 8.9–10.3)
Chloride: 90 mmol/L — ABNORMAL LOW (ref 98–111)
Creatinine, Ser: 14.7 mg/dL — ABNORMAL HIGH (ref 0.44–1.00)
GFR calc Af Amer: 3 mL/min — ABNORMAL LOW (ref >60)
GFR calc non Af Amer: 3 mL/min — ABNORMAL LOW (ref >60)
Glucose, Bld: 63 mg/dL — ABNORMAL LOW (ref 70–99)
Potassium: 3.3 mmol/L — ABNORMAL LOW (ref 3.5–5.1)
Sodium: 131 mmol/L — ABNORMAL LOW (ref 135–145)

## 2019-04-06 LAB — GLUCOSE, CAPILLARY
Glucose-Capillary: 67 mg/dL — ABNORMAL LOW (ref 70–99)
Glucose-Capillary: 87 mg/dL (ref 70–99)
Glucose-Capillary: 300 mg/dL — ABNORMAL HIGH (ref 70–99)
Glucose-Capillary: 64 mg/dL — ABNORMAL LOW (ref 70–99)
Glucose-Capillary: 65 mg/dL — ABNORMAL LOW (ref 70–99)
Glucose-Capillary: 117 mg/dL — ABNORMAL HIGH (ref 70–99)

## 2019-04-06 LAB — CBC
HCT: 30.7 % — ABNORMAL LOW (ref 36.0–46.0)
Hemoglobin: 10.3 g/dL — ABNORMAL LOW (ref 12.0–15.0)
MCH: 22.4 pg — ABNORMAL LOW (ref 26.0–34.0)
MCHC: 33.6 g/dL (ref 30.0–36.0)
MCV: 66.9 fL — ABNORMAL LOW (ref 80.0–100.0)
Platelets: 173 10*3/uL (ref 150–400)
RBC: 4.59 MIL/uL (ref 3.87–5.11)
RDW: 20.1 % — ABNORMAL HIGH (ref 11.5–15.5)
WBC: 9.6 10*3/uL (ref 4.0–10.5)
nRBC: 0 % (ref 0.0–0.2)

## 2019-04-06 LAB — FOLATE: Folate: 5.6 ng/mL — ABNORMAL LOW (ref >5.9)

## 2019-04-06 MED ORDER — SEVELAMER CARBONATE 800 MG PO TABS
3200.0000 mg | ORAL_TABLET | Freq: Three times a day (TID) | ORAL | Status: DC
  Administered 2019-04-06 – 2019-04-07 (×3): 3200 mg via ORAL
  Filled 2019-04-06 (×4): qty 4

## 2019-04-06 MED ORDER — POTASSIUM CHLORIDE IN NACL 20-0.9 MEQ/L-% IV SOLN
INTRAVENOUS | Status: AC
  Filled 2019-04-06: qty 1000

## 2019-04-06 MED ORDER — INSULIN GLARGINE 100 UNIT/ML ~~LOC~~ SOLN
6.0000 [IU] | Freq: Every day | SUBCUTANEOUS | Status: DC
  Administered 2019-04-06 – 2019-04-08 (×3): 6 [IU] via SUBCUTANEOUS
  Filled 2019-04-06 (×4): qty 0.06

## 2019-04-06 MED ORDER — GLUCOSE 40 % PO GEL
1.0000 | ORAL | Status: AC
  Filled 2019-04-06: qty 1

## 2019-04-06 NOTE — Progress Notes (Signed)
Hypoglycemic Event  CBG: 64  Treatment: Sprite, gingerale, ice cream  Symptoms: none  Follow-up CBG: Time:1842 CBG Result:117  Possible Reasons for Event: none  Comments/MD notified: Patient drank the soda slowly so the first check was 65 and she has just finished them so I waited to check again after some time passed.    Darnelle Catalan

## 2019-04-06 NOTE — Progress Notes (Signed)
[facility]  ROUNDING NOTE   Subjective:   Peritoneal dialysis treatment last night. UF was negative. 1.5% dextrose.   Patient states she is having lower abdominal pain and diarrhea. She does state she is feeling better.   Objective:  Vital signs in last 24 hours:  Temp:  [97.5 F (36.4 C)-99.4 F (37.4 C)] 99.4 F (37.4 C) (01/09 1206) Pulse Rate:  [79-83] 83 (01/09 1206) Resp:  [16-20] 20 (01/09 1206) BP: (114-147)/(63-74) 114/63 (01/09 1206) SpO2:  [94 %-100 %] 94 % (01/09 1206) Weight:  [103.7 kg] 103.7 kg (01/09 0500)  Weight change:  Filed Weights   04/03/19 1738 04/06/19 0500  Weight: 103.9 kg 103.7 kg    Intake/Output: I/O last 3 completed shifts: In: 120 [P.O.:120] Out: -    Intake/Output this shift:  No intake/output data recorded.  Physical Exam: General: NAD, laying in bed  Head: Normocephalic, atraumatic. Moist oral mucosal membranes  Eyes: Anicteric, PERRL  Neck: Supple, trachea midline  Lungs:  Clear to auscultation  Heart: Regular rate and rhythm  Abdomen:  Right and left lower quadrant tenderness   Extremities:  no peripheral edema.  Neurologic: Nonfocal, moving all four extremities  Skin: No lesions  Access: PD catheter    Basic Metabolic Panel: Recent Labs  Lab 03/31/19 2112 04/02/19 1714 04/03/19 1749 04/04/19 0535 04/06/19 0601  NA 126* 127* 129* 128* 131*  K 3.9 3.4* 4.2 4.4 3.3*  CL 85* 86* 87* 87* 90*  CO2 20* 19* 18* 18* 19*  GLUCOSE 292* 209* 200* 207* 63*  BUN 112* 132* 132* 147* 128*  CREATININE 12.32* 14.54* 15.50* 16.24* 14.70*  CALCIUM 7.6* 7.5* 7.9* 7.6* 7.1*  PHOS  --   --   --  12.3*  --     Liver Function Tests: Recent Labs  Lab 04/02/19 1714 04/04/19 0535  AST 12*  --   ALT 9  --   ALKPHOS 78  --   BILITOT 0.5  --   PROT 6.3*  --   ALBUMIN 2.2* 2.3*   No results for input(s): LIPASE, AMYLASE in the last 168 hours. No results for input(s): AMMONIA in the last 168 hours.  CBC: Recent  Labs  Lab 03/31/19 2112 04/02/19 1714 04/03/19 1749 04/04/19 0535 04/04/19 1249 04/05/19 1306 04/06/19 0601  WBC 6.7 8.3 6.3 7.3  --   --  9.6  NEUTROABS  --  6.2  --   --   --   --   --   HGB 12.6 11.7* 12.1 11.6* 11.8* 10.7* 10.3*  HCT 38.2 36.1 36.1 35.5*  --   --  30.7*  MCV 68.2* 68.2* 66.1* 66.9*  --   --  66.9*  PLT 287 258 263 237  --   --  173    Cardiac Enzymes: No results for input(s): CKTOTAL, CKMB, CKMBINDEX, TROPONINI in the last 168 hours.  BNP: Invalid input(s): POCBNP  CBG: Recent Labs  Lab 04/05/19 1649 04/05/19 2031 04/06/19 0746 04/06/19 0930 04/06/19 1204  GLUCAP 135* 146* 67* 87 300*    Microbiology: Results for orders placed or performed during the hospital encounter of 03/25/19  Respiratory Panel by RT PCR (Flu A&B, Covid) - Nasopharyngeal Swab     Status: Abnormal   Collection Time: 03/25/19 11:32 PM   Specimen: Nasopharyngeal Swab  Result Value Ref Range Status   SARS Coronavirus 2 by RT PCR POSITIVE (A) NEGATIVE Final    Comment: RESULT CALLED TO, READ BACK BY AND VERIFIED WITH:  EASTER,T. AT 0031 ON 03/26/2019 BY EVA (NOTE) SARS-CoV-2 target nucleic acids are DETECTED. SARS-CoV-2 RNA is generally detectable in upper respiratory specimens  during the acute phase of infection. Positive results are indicative of the presence of the identified virus, but do not rule out bacterial infection or co-infection with other pathogens not detected by the test. Clinical correlation with patient history and other diagnostic information is necessary to determine patient infection status. The expected result is Negative. Fact Sheet for Patients:  PinkCheek.be Fact Sheet for Healthcare Providers: GravelBags.it This test is not yet approved or cleared by the [place] FDA and  has been authorized for detection and/or diagnosis of SARS-CoV-2 by FDA under an Emergency Use Authorization (EUA).   This EUA will remain in effect (meaning this test can be used ) for the duration of  the COVID-19 declaration under Section 564(b)(1) of the Act, 21 U.S.C. section 360bbb-3(b)(1), unless the authorization is terminated or revoked sooner.    Influenza A by PCR NEGATIVE NEGATIVE Final   Influenza B by PCR NEGATIVE NEGATIVE Final    Comment: (NOTE) The Xpert Xpress SARS-CoV-2/FLU/RSV assay is intended as an aid in  the diagnosis of influenza from Nasopharyngeal swab specimens and  should not be used as a sole basis for treatment. Nasal washings and  aspirates are unacceptable for Xpert Xpress SARS-CoV-2/FLU/RSV  testing. Fact Sheet for Patients: PinkCheek.be Fact Sheet for Healthcare Providers: GravelBags.it This test is not yet approved or cleared by the [place] FDA and  has been authorized for detection and/or diagnosis of SARS-CoV-2 by  FDA under an Emergency Use Authorization (EUA). This EUA will remain  in effect (meaning this test can be used) for the duration of the  Covid-19 declaration under Section 564(b)(1) of the Act, 21  U.S.C. section 360bbb-3(b)(1), unless the authorization is  terminated or revoked. Performed at [facility] [address], [place], [place] [address]     Coagulation Studies: No results for input(s): LABPROT, INR in the last 72 hours.  Urinalysis: No results for input(s): COLORURINE, LABSPEC, PHURINE, GLUCOSEU, HGBUR, BILIRUBINUR, KETONESUR, PROTEINUR, UROBILINOGEN, NITRITE, LEUKOCYTESUR in the last 72 hours.  Invalid input(s): APPERANCEUR    Imaging: No results found.   Medications:   . 0.9 % NaCl with KCl 20 mEq / L 75 mL/hr at 04/06/19 1402  . dialysis solution 1.5% low-MG/low-CA     . amLODipine  10 mg Oral Daily  . calcitRIOL  0.5 mcg Oral Once per day on Mon Tue Wed Thu Fri  . calcitRIOL  1 mcg Oral Once per day on Sun Sat  . calcium acetate  2,001 mg Oral TID WC  .  cholestyramine  4 g Oral BID  . dextrose  1 Tube Oral STAT  . doxazosin  8 mg Oral QHS  . feeding supplement (PRO-STAT SUGAR FREE 64)  30 mL Oral TID  . gentamicin cream  1 application Topical Daily  . insulin aspart  0-5 Units Subcutaneous QHS  . insulin aspart  0-9 Units Subcutaneous TID WC  . insulin glargine  10 Units Subcutaneous QHS  . labetalol  100 mg Oral BID  . losartan  100 mg Oral Daily  . multivitamin  1 tablet Oral QHS  . pantoprazole (PROTONIX) IV  40 mg Intravenous Q12H   acetaminophen **OR** acetaminophen, Ipratropium-Albuterol, ondansetron (ZOFRAN) IV  Assessment/ Plan:  54 y.o. female   Janet Mitchell is a 54 y.o. black female with end stage renal disease on peritoneal dialysis, hypertension,  diabetes mellitus type II, congestive heart failure who presents to [facility] on 04/03/2019 for Hyponatremia [E87.1] Elevated BUN [R79.9] ESRD (end stage renal disease) ([place]) [N18.6] ESRD on peritoneal dialysis ([place]) [N18.6, Z99.2] COVID-19 [U07.1] Black stool [Z30.8]   Complicated admission to [facility] for COVID-19 infection  [facility] 114kg Peritoneal Dialysis CCPD 12 hours 2837mL fills 4 exchanges last fill of 2535mL extraneal  1. End Stage Renal Disease: on peritoneal dialysis. Negative ultrafiltration last night.  Exam and ultrafiltration consistent with dehydration/hypovolemia - IV fluids: NS with 20KCl at 59mL/hr for 12 hours -Resume peritoneal dialysis tonight with home prescription, holding last fill with extraneal. Dextrose 1.5%.   2. Hypertension: 114/63. Home regimen of losartan, amlodipine, labetalol, doxazosin.   3. Anemia of chronic kidney disease: hemoglobin 10.3 Holding EPO  4. Secondary Hyperparathyroidism: with hyperphosphatemia 12.3 Corrected calcium of 8.5 - discontinue calcitriol - discontinue calcium acetate - resume home sevelamer 4 tablets with meals.    LOS: 2 Milarose Savich 1/9/20212:58 PM

## 2019-04-06 NOTE — Progress Notes (Signed)
Chart reviewed.  Patient not seen or examined Hemoglobin has been stable No evidence of active GI bleed Do not recommend inpatient endoscopic evaluation given her COVID-19 status and no absolute indication at this time Recommend to follow-up with GI as outpatient GI will sign off at this time Please call GI back with questions or concerns  Cephas Darby, MD [place]  [place], [place] [address]  Main: [phone]  Fax: [phone] Pager: [phone]

## 2019-04-06 NOTE — Progress Notes (Signed)
Patient ID: Janet Mitchell, female   DOB: [birth year], 54 y.o.   MRN: [id number] [facility] PROGRESS NOTE  Janet Mitchell DXA:[id number] DOB: 1965/08/17 DOA: 04/03/2019 PCP: Lucianne Lei, MD  HPI/Subjective: Patient had some diarrhea last night but settled down this morning.  Feels weak.  Still having lower abdominal pain.   Objective: Vitals:   04/06/19 0841 04/06/19 1206  BP: 132/73 114/63  Pulse: 81 83  Resp:  20  Temp:  99.4 F (37.4 C)  SpO2:  94%    Filed Weights   04/03/19 1738 04/06/19 0500  Weight: 103.9 kg 103.7 kg    ROS: Review of Systems  Constitutional: Positive for malaise/fatigue. Negative for chills and fever.  Eyes: Negative for blurred vision.  Respiratory: Negative for cough and shortness of breath.   Cardiovascular: Negative for chest pain.  Gastrointestinal: Positive for abdominal pain and diarrhea. Negative for constipation, nausea and vomiting.  Musculoskeletal: Negative for joint pain.  Neurological: Negative for dizziness and headaches.   Exam: Physical Exam  Constitutional: She is oriented to person, place, and time.  HENT:  Nose: No mucosal edema.  Mouth/Throat: No oropharyngeal exudate or posterior oropharyngeal edema.  Eyes: Conjunctivae and lids are normal.  Neck: Carotid bruit is not present.  Cardiovascular: S1 normal and S2 normal. Exam reveals no gallop.  No murmur heard. Pulses:      Dorsalis pedis pulses are 2+ on the right side and 2+ on the left side.  Respiratory: No respiratory distress. She has no wheezes. She has no rhonchi. She has no rales.  GI: Soft. Bowel sounds are normal. There is abdominal tenderness in the right lower quadrant, suprapubic area and left lower quadrant.  Musculoskeletal:     Right ankle: No swelling.     Left ankle: No swelling.  Lymphadenopathy:    She has no cervical adenopathy.  Neurological: She is alert and oriented to person, place, and time. No cranial nerve deficit.  Skin: Skin is  warm. No rash noted. Nails show no clubbing.  Psychiatric: She has a normal mood and affect.      Data Reviewed: Basic Metabolic Panel: Recent Labs  Lab 03/31/19 2112 04/02/19 1714 04/03/19 1749 04/04/19 0535 04/06/19 0601  NA 126* 127* 129* 128* 131*  K 3.9 3.4* 4.2 4.4 3.3*  CL 85* 86* 87* 87* 90*  CO2 20* 19* 18* 18* 19*  GLUCOSE 292* 209* 200* 207* 63*  BUN 112* 132* 132* 147* 128*  CREATININE 12.32* 14.54* 15.50* 16.24* 14.70*  CALCIUM 7.6* 7.5* 7.9* 7.6* 7.1*  PHOS  --   --   --  12.3*  --    Liver Function Tests: Recent Labs  Lab 04/02/19 1714 04/04/19 0535  AST 12*  --   ALT 9  --   ALKPHOS 78  --   BILITOT 0.5  --   PROT 6.3*  --   ALBUMIN 2.2* 2.3*   CBC: Recent Labs  Lab 03/31/19 2112 04/02/19 1714 04/03/19 1749 04/04/19 0535 04/04/19 1249 04/05/19 1306 04/06/19 0601  WBC 6.7 8.3 6.3 7.3  --   --  9.6  NEUTROABS  --  6.2  --   --   --   --   --   HGB 12.6 11.7* 12.1 11.6* 11.8* 10.7* 10.3*  HCT 38.2 36.1 36.1 35.5*  --   --  30.7*  MCV 68.2* 68.2* 66.1* 66.9*  --   --  66.9*  PLT 287 258 263 237  --   --  173    CBG: Recent Labs  Lab 04/05/19 1649 04/05/19 2031 04/06/19 0746 04/06/19 0930 04/06/19 1204  GLUCAP 135* 146* 67* 87 300*    Scheduled Meds: . amLODipine  10 mg Oral Daily  . calcitRIOL  0.5 mcg Oral Once per day on Mon Tue Wed Thu Fri  . calcitRIOL  1 mcg Oral Once per day on Sun Sat  . calcium acetate  2,001 mg Oral TID WC  . cholestyramine  4 g Oral BID  . dextrose  1 Tube Oral STAT  . doxazosin  8 mg Oral QHS  . feeding supplement (PRO-STAT SUGAR FREE 64)  30 mL Oral TID  . gentamicin cream  1 application Topical Daily  . insulin aspart  0-5 Units Subcutaneous QHS  . insulin aspart  0-9 Units Subcutaneous TID WC  . insulin glargine  10 Units Subcutaneous QHS  . labetalol  100 mg Oral BID  . losartan  100 mg Oral Daily  . multivitamin  1 tablet Oral QHS  . pantoprazole (PROTONIX) IV  40 mg Intravenous Q12H    Continuous Infusions: . 0.9 % NaCl with KCl 20 mEq / L 75 mL/hr at 04/06/19 1402  . dialysis solution 1.5% low-MG/low-CA      Assessment/Plan:  1. Acute uremia with end-stage renal disease.  Peritoneal dialysis as per nephrology.  Nephrology also started IV fluids for 12 hours today.  Follow-up BMP daily 2. Lower abdominal pain and diarrhea.  I started cholestyramine and increased to twice daily dosing.  Case discussed with nephrology to this consider sending off fluid from the peritoneal cavity. 3. Black stool that is guaiac positive.  Aspirin and heparin subcutaneous injections stopped.  On IV Protonix.  Since hemoglobin is stable GI would rather not do any procedures at this point. 4. Weakness.  MRI of the brain 04/02/2019 negative for acute abnormality.  Still very weak with physical therapy.  Patient will likely go home with home health even though physical therapy recommends rehab. 5. Essential hypertension on amlodipine, labetalol, losartan and doxazosin 6. Recent COVID-19 infection.  Finished course of remdesivir and steroids. 7. Type 2 diabetes mellitus with end-stage renal disease on glargine insulin and sliding scale.  Code Status:     Code Status Orders  (From admission, onward)         Start     Ordered   04/03/19 2049  Full code  Continuous     04/03/19 2049        Code Status History    Date Active Date Inactive Code Status Order ID Comments User Context   03/25/2019 2244 03/30/2019 2251 Full Code [id number]  Reubin Milan, MD ED   12/18/2018 0536 12/20/2018 2021 Full Code [id number]  Oswald Hillock, MD ED   03/25/2018 0224 03/27/2018 1552 Full Code [id number]  Ivor Costa, MD ED   03/09/2018 1634 03/11/2018 1551 Full Code [id number]  Donnie Mesa, MD Inpatient   11/07/2017 0411 11/09/2017 1522 Full Code [id number]  Reubin Milan, MD Inpatient   Advance Care Planning Activity     Family Communication: Spoke with husband on cell phone Disposition Plan: To be  determined  Consultants:  Nephrology  Time spent: 28 minutes, case discussed with nephrology  [place]

## 2019-04-06 NOTE — Progress Notes (Signed)
Hypoglycemic Event  CBG: 67  Treatment: Pt refused Glutose but drank 8oz of orange juice  Symptoms: none  Follow-up CBG: [id number] CBG Result:87  Possible Reasons for Event:   Comments/MD notified: N/A    Janet Mitchell  04/06/2019

## 2019-04-07 DIAGNOSIS — R197 Diarrhea, unspecified: Secondary | ICD-10-CM

## 2019-04-07 LAB — CBC
HCT: 26.7 % — ABNORMAL LOW (ref 36.0–46.0)
Hemoglobin: 9 g/dL — ABNORMAL LOW (ref 12.0–15.0)
MCH: 22.3 pg — ABNORMAL LOW (ref 26.0–34.0)
MCHC: 33.7 g/dL (ref 30.0–36.0)
MCV: 66.1 fL — ABNORMAL LOW (ref 80.0–100.0)
Platelets: 141 10*3/uL — ABNORMAL LOW (ref 150–400)
RBC: 4.04 MIL/uL (ref 3.87–5.11)
RDW: 19.5 % — ABNORMAL HIGH (ref 11.5–15.5)
WBC: 10.4 10*3/uL (ref 4.0–10.5)
nRBC: 0 % (ref 0.0–0.2)

## 2019-04-07 LAB — RENAL FUNCTION PANEL
Albumin: 1.7 g/dL — ABNORMAL LOW (ref 3.5–5.0)
Anion gap: 20 — ABNORMAL HIGH (ref 5–15)
BUN: 117 mg/dL — ABNORMAL HIGH (ref 6–20)
CO2: 20 mmol/L — ABNORMAL LOW (ref 22–32)
Calcium: 7 mg/dL — ABNORMAL LOW (ref 8.9–10.3)
Chloride: 90 mmol/L — ABNORMAL LOW (ref 98–111)
Creatinine, Ser: 14 mg/dL — ABNORMAL HIGH (ref 0.44–1.00)
GFR calc Af Amer: 3 mL/min — ABNORMAL LOW (ref >60)
GFR calc non Af Amer: 3 mL/min — ABNORMAL LOW (ref >60)
Glucose, Bld: 176 mg/dL — ABNORMAL HIGH (ref 70–99)
Phosphorus: 7.9 mg/dL — ABNORMAL HIGH (ref 2.5–4.6)
Potassium: 3.6 mmol/L (ref 3.5–5.1)
Sodium: 130 mmol/L — ABNORMAL LOW (ref 135–145)

## 2019-04-07 LAB — GLUCOSE, CAPILLARY
Glucose-Capillary: 154 mg/dL — ABNORMAL HIGH (ref 70–99)
Glucose-Capillary: 180 mg/dL — ABNORMAL HIGH (ref 70–99)
Glucose-Capillary: 116 mg/dL — ABNORMAL HIGH (ref 70–99)
Glucose-Capillary: 112 mg/dL — ABNORMAL HIGH (ref 70–99)
Glucose-Capillary: 96 mg/dL (ref 70–99)

## 2019-04-07 LAB — VITAMIN B12: Vitamin B-12: 377 pg/mL (ref 180–914)

## 2019-04-07 MED ORDER — POTASSIUM CHLORIDE IN NACL 20-0.9 MEQ/L-% IV SOLN
INTRAVENOUS | Status: DC
  Filled 2019-04-07 (×5): qty 1000

## 2019-04-07 MED ORDER — DICYCLOMINE HCL 10 MG PO CAPS
10.0000 mg | ORAL_CAPSULE | Freq: Three times a day (TID) | ORAL | Status: DC
  Administered 2019-04-07 – 2019-04-09 (×9): 10 mg via ORAL
  Filled 2019-04-07 (×12): qty 1

## 2019-04-07 MED ORDER — GERHARDT'S BUTT CREAM
TOPICAL_CREAM | Freq: Three times a day (TID) | CUTANEOUS | Status: DC | PRN
  Filled 2019-04-07 (×2): qty 1

## 2019-04-07 MED ORDER — TRAZODONE HCL 50 MG PO TABS
50.0000 mg | ORAL_TABLET | Freq: Once | ORAL | Status: AC
  Administered 2019-04-07: 50 mg via ORAL
  Filled 2019-04-07: qty 1

## 2019-04-07 MED ORDER — MORPHINE SULFATE (PF) 2 MG/ML IV SOLN
2.0000 mg | INTRAVENOUS | Status: AC | PRN
  Administered 2019-04-07 – 2019-04-08 (×3): 2 mg via INTRAVENOUS
  Filled 2019-04-07 (×3): qty 1

## 2019-04-07 NOTE — Progress Notes (Signed)
[facility]  ROUNDING NOTE   Subjective:   Peritoneal dialysis treatment last night. UF was negative. 1.5% dextrose.   Given IV fluids yesterday. She states she is feeling better. But continues to have abdominal pain.   Objective:  Vital signs in last 24 hours:  Temp:  [98 F (36.7 C)-98.3 F (36.8 C)] 98.3 F (36.8 C) (01/10 0456) Pulse Rate:  [79-81] 79 (01/10 0456) Resp:  [16-17] 17 (01/10 0456) BP: (110-116)/(64-66) 116/64 (01/10 0456) SpO2:  [98 %] 98 % (01/10 0456) Weight:  [108.4 kg] 108.4 kg (01/10 0456)  Weight change: 4.664 kg Filed Weights   04/03/19 1738 04/06/19 0500 04/07/19 0456  Weight: 103.9 kg 103.7 kg 108.4 kg    Intake/Output: I/O last 3 completed shifts: In: 296.6 [I.V.:296.6] Out: 0    Intake/Output this shift:  No intake/output data recorded.  Physical Exam: General: NAD, laying in bed  Head: Normocephalic, atraumatic. Moist oral mucosal membranes  Eyes: Anicteric, PERRL  Neck: Supple, trachea midline  Lungs:  Clear to auscultation  Heart: Regular rate and rhythm  Abdomen:  Right and left lower quadrant tenderness   Extremities:  no peripheral edema.  Neurologic: Nonfocal, moving all four extremities  Skin: No lesions  Access: PD catheter    Basic Metabolic Panel: Recent Labs  Lab 04/02/19 1714 04/03/19 1749 04/04/19 0535 04/06/19 0601 04/07/19 0514  NA 127* 129* 128* 131* 130*  K 3.4* 4.2 4.4 3.3* 3.6  CL 86* 87* 87* 90* 90*  CO2 19* 18* 18* 19* 20*  GLUCOSE 209* 200* 207* 63* 176*  BUN 132* 132* 147* 128* 117*  CREATININE 14.54* 15.50* 16.24* 14.70* 14.00*  CALCIUM 7.5* 7.9* 7.6* 7.1* 7.0*  PHOS  --   --  12.3*  --  7.9*    Liver Function Tests: Recent Labs  Lab 04/02/19 1714 04/04/19 0535 04/07/19 0514  AST 12*  --   --   ALT 9  --   --   ALKPHOS 78  --   --   BILITOT 0.5  --   --   PROT 6.3*  --   --   ALBUMIN 2.2* 2.3* 1.7*   No results for input(s): LIPASE, AMYLASE in the last 168 hours. No  results for input(s): AMMONIA in the last 168 hours.  CBC: Recent Labs  Lab 04/02/19 1714 04/03/19 1749 04/04/19 0535 04/04/19 1249 04/05/19 1306 04/06/19 0601 04/07/19 0514  WBC 8.3 6.3 7.3  --   --  9.6 10.4  NEUTROABS 6.2  --   --   --   --   --   --   HGB 11.7* 12.1 11.6* 11.8* 10.7* 10.3* 9.0*  HCT 36.1 36.1 35.5*  --   --  30.7* 26.7*  MCV 68.2* 66.1* 66.9*  --   --  66.9* 66.1*  PLT 258 263 237  --   --  173 141*    Cardiac Enzymes: No results for input(s): CKTOTAL, CKMB, CKMBINDEX, TROPONINI in the last 168 hours.  BNP: Invalid input(s): POCBNP  CBG: Recent Labs  Lab 04/06/19 1814 04/06/19 1842 04/06/19 2217 04/07/19 0814 04/07/19 1129  GLUCAP 65* 117* 154* 180* 116*    Microbiology: Results for orders placed or performed during the hospital encounter of 03/25/19  Respiratory Panel by RT PCR (Flu A&B, Covid) - Nasopharyngeal Swab     Status: Abnormal   Collection Time: 03/25/19 11:32 PM   Specimen: Nasopharyngeal Swab  Result Value Ref Range Status   SARS Coronavirus 2 by  RT PCR POSITIVE (A) NEGATIVE Final    Comment: RESULT CALLED TO, READ BACK BY AND VERIFIED WITH: EASTER,T. AT 0031 ON 03/26/2019 BY EVA (NOTE) SARS-CoV-2 target nucleic acids are DETECTED. SARS-CoV-2 RNA is generally detectable in upper respiratory specimens  during the acute phase of infection. Positive results are indicative of the presence of the identified virus, but do not rule out bacterial infection or co-infection with other pathogens not detected by the test. Clinical correlation with patient history and other diagnostic information is necessary to determine patient infection status. The expected result is Negative. Fact Sheet for Patients:  PinkCheek.be Fact Sheet for Healthcare Providers: GravelBags.it This test is not yet approved or cleared by the [place] FDA and  has been authorized for detection and/or  diagnosis of SARS-CoV-2 by FDA under an Emergency Use Authorization (EUA).  This EUA will remain in effect (meaning this test can be used ) for the duration of  the COVID-19 declaration under Section 564(b)(1) of the Act, 21 U.S.C. section 360bbb-3(b)(1), unless the authorization is terminated or revoked sooner.    Influenza A by PCR NEGATIVE NEGATIVE Final   Influenza B by PCR NEGATIVE NEGATIVE Final    Comment: (NOTE) The Xpert Xpress SARS-CoV-2/FLU/RSV assay is intended as an aid in  the diagnosis of influenza from Nasopharyngeal swab specimens and  should not be used as a sole basis for treatment. Nasal washings and  aspirates are unacceptable for Xpert Xpress SARS-CoV-2/FLU/RSV  testing. Fact Sheet for Patients: PinkCheek.be Fact Sheet for Healthcare Providers: GravelBags.it This test is not yet approved or cleared by the [place] FDA and  has been authorized for detection and/or diagnosis of SARS-CoV-2 by  FDA under an Emergency Use Authorization (EUA). This EUA will remain  in effect (meaning this test can be used) for the duration of the  Covid-19 declaration under Section 564(b)(1) of the Act, 21  U.S.C. section 360bbb-3(b)(1), unless the authorization is  terminated or revoked. Performed at [facility], [address] [place], [place] [address]     Coagulation Studies: No results for input(s): LABPROT, INR in the last 72 hours.  Urinalysis: No results for input(s): COLORURINE, LABSPEC, PHURINE, GLUCOSEU, HGBUR, BILIRUBINUR, KETONESUR, PROTEINUR, UROBILINOGEN, NITRITE, LEUKOCYTESUR in the last 72 hours.  Invalid input(s): APPERANCEUR    Imaging: No results found.   Medications:   . 0.9 % NaCl with KCl 20 mEq / L    . dialysis solution 1.5% low-MG/low-CA     . amLODipine  10 mg Oral Daily  . cholestyramine  4 g Oral BID  . dicyclomine  10 mg Oral TID AC & HS  . doxazosin  8 mg Oral QHS  .  feeding supplement (PRO-STAT SUGAR FREE 64)  30 mL Oral TID  . gentamicin cream  1 application Topical Daily  . insulin aspart  0-5 Units Subcutaneous QHS  . insulin aspart  0-9 Units Subcutaneous TID WC  . insulin glargine  6 Units Subcutaneous QHS  . labetalol  100 mg Oral BID  . losartan  100 mg Oral Daily  . multivitamin  1 tablet Oral QHS  . pantoprazole (PROTONIX) IV  40 mg Intravenous Q12H  . sevelamer carbonate  3,200 mg Oral TID WC   acetaminophen **OR** acetaminophen, Ipratropium-Albuterol, ondansetron (ZOFRAN) IV  Assessment/ Plan:  54 y.o. female   [name] is a 54 y.o. black female with end stage renal disease on peritoneal dialysis, hypertension, diabetes mellitus type II, congestive heart failure who presents to  [place] on 04/03/2019 for Hyponatremia [E87.1] Elevated BUN [R79.9] ESRD (end stage renal disease) ([place]) [N18.6] ESRD on peritoneal dialysis ([place]) [N18.6, Z99.2] COVID-19 [U07.1] Black stool [O03.7]   Complicated admission to [facility] for COVID-19 infection  [facility] 114kg Peritoneal Dialysis CCPD 12 hours 2876mL fills 4 exchanges last fill of 2562mL extraneal  1. End Stage Renal Disease: on peritoneal dialysis. Negative ultrafiltration last night.  Exam and ultrafiltration consistent with dehydration/hypovolemia - IV fluids: NS with 20KCl at 1mL/hr   -Resume peritoneal dialysis tonight with home prescription, holding last fill with extraneal. Dextrose 1.5%.   2. Hypertension:  Home regimen of losartan, amlodipine, labetalol, doxazosin.   3. Anemia of chronic kidney disease: hemoglobin 9 Holding EPO - consider administration of EPO this week.   4. Secondary Hyperparathyroidism: with hyperphosphatemia 7.9 Corrected calcium at goal - discontinued calcitriol - discontinued calcium acetate - resumed home sevelamer 4 tablets with meals.    LOS: 3 Rey Fors 1/10/20212:34 PM

## 2019-04-07 NOTE — Progress Notes (Signed)
CCPD Tx started    04/07/19 2210  Cycler Setup  Total Number of Exchanges 4  Fill Volume 2000  Dianeal Solution Dextrose 1.5% in 6000 mL  Last Fill Volume 0  Fill Time - Minute(s) 10  Dwell Time - Hour(s) 2  Dwell Time - Minute(s) 43  Drain Time - Minute(s) 20 mins  Exit Site Care Performed Yes  Completion  Treatment Status Started  Hand-Off documentation  Report given to (Full Name) Beatris Ship, RN  Report received from (Full Name) Cherlynn Perches, RN

## 2019-04-07 NOTE — Plan of Care (Signed)
Patient was able to stand and pivot to the [facility] with a walker and assistance. She still has a lot of weakness and was dizzy when she stood up to get cleaned up. Will continue to monitor.  Christene Slates

## 2019-04-07 NOTE — Progress Notes (Signed)
Pre CCPD Assessment    04/07/19 2100  Neurological  Level of Consciousness Alert  Orientation Level Oriented X4  Respiratory  Respiratory Pattern Regular  Chest Assessment Chest expansion symmetrical  Bilateral Breath Sounds Diminished  Cardiac  Pulse Regular  Vascular  R Radial Pulse +2  L Radial Pulse +2  Peritoneal Catheter Right lower abdomen  No Placement Date or Time found.   Catheter Location: Right lower abdomen  Site Assessment Clean;Dry;Intact  Drainage Description None  Catheter status Deaccessed  Dressing Gauze/Drain sponge  Dressing Status Clean;Dry;Intact  Dressing Intervention Dressing changed  Psychosocial  Psychosocial (WDL) WDL  Patient Behaviors Cooperative;Calm

## 2019-04-07 NOTE — Progress Notes (Signed)
Patient ID: Janet Mitchell, female   DOB: June 18, 1965, 54 y.o.   MRN: [id number] [facility] PROGRESS NOTE  Michal Strzelecki Mitchell QQP:[id number] DOB: 08-18-1965 DOA: 04/03/2019 PCP: Lucianne Lei, MD  HPI/Subjective: Patient had 2 episodes of diarrhea last night.  Abdominal pain is less today.  No nausea or vomiting.  She is starting to feel stronger and better.  She is doing leg exercises and leg lifts in the bed.  Objective: Vitals:   04/06/19 2121 04/07/19 0456  BP: 110/66 116/64  Pulse: 81 79  Resp: 16 17  Temp: 98 F (36.7 C) 98.3 F (36.8 C)  SpO2: 98% 98%    Filed Weights   04/03/19 1738 04/06/19 0500 04/07/19 0456  Weight: 103.9 kg 103.7 kg 108.4 kg    ROS: Review of Systems  Constitutional: Positive for malaise/fatigue. Negative for chills and fever.  Eyes: Negative for blurred vision.  Respiratory: Negative for cough and shortness of breath.   Cardiovascular: Negative for chest pain.  Gastrointestinal: Positive for abdominal pain and diarrhea. Negative for constipation, nausea and vomiting.  Musculoskeletal: Negative for joint pain.  Neurological: Negative for dizziness and headaches.   Exam: Physical Exam  Constitutional: She is oriented to person, place, and time.  HENT:  Nose: No mucosal edema.  Mouth/Throat: No oropharyngeal exudate or posterior oropharyngeal edema.  Eyes: Conjunctivae and lids are normal.  Neck: Carotid bruit is not present.  Cardiovascular: S1 normal and S2 normal. Exam reveals no gallop.  No murmur heard. Pulses:      Dorsalis pedis pulses are 2+ on the right side and 2+ on the left side.  Respiratory: No respiratory distress. She has no wheezes. She has no rhonchi. She has no rales.  GI: Soft. Bowel sounds are normal. There is abdominal tenderness in the right lower quadrant, suprapubic area and left lower quadrant.  Musculoskeletal:     Right ankle: No swelling.     Left ankle: No swelling.  Lymphadenopathy:    She has no  cervical adenopathy.  Neurological: She is alert and oriented to person, place, and time. No cranial nerve deficit.  Skin: Skin is warm. No rash noted. Nails show no clubbing.  Psychiatric: She has a normal mood and affect.      Data Reviewed: Basic Metabolic Panel: Recent Labs  Lab 04/02/19 1714 04/03/19 1749 04/04/19 0535 04/06/19 0601 04/07/19 0514  NA 127* 129* 128* 131* 130*  K 3.4* 4.2 4.4 3.3* 3.6  CL 86* 87* 87* 90* 90*  CO2 19* 18* 18* 19* 20*  GLUCOSE 209* 200* 207* 63* 176*  BUN 132* 132* 147* 128* 117*  CREATININE 14.54* 15.50* 16.24* 14.70* 14.00*  CALCIUM 7.5* 7.9* 7.6* 7.1* 7.0*  PHOS  --   --  12.3*  --  7.9*   Liver Function Tests: Recent Labs  Lab 04/02/19 1714 04/04/19 0535 04/07/19 0514  AST 12*  --   --   ALT 9  --   --   ALKPHOS 78  --   --   BILITOT 0.5  --   --   PROT 6.3*  --   --   ALBUMIN 2.2* 2.3* 1.7*   CBC: Recent Labs  Lab 04/02/19 1714 04/03/19 1749 04/04/19 0535 04/04/19 1249 04/05/19 1306 04/06/19 0601 04/07/19 0514  WBC 8.3 6.3 7.3  --   --  9.6 10.4  NEUTROABS 6.2  --   --   --   --   --   --   HGB 11.7* 12.1  11.6* 11.8* 10.7* 10.3* 9.0*  HCT 36.1 36.1 35.5*  --   --  30.7* 26.7*  MCV 68.2* 66.1* 66.9*  --   --  66.9* 66.1*  PLT 258 263 237  --   --  173 141*    CBG: Recent Labs  Lab 04/06/19 1814 04/06/19 1842 04/06/19 2217 04/07/19 0814 04/07/19 1129  GLUCAP 65* 117* 154* 180* 116*    Scheduled Meds: . amLODipine  10 mg Oral Daily  . cholestyramine  4 g Oral BID  . dicyclomine  10 mg Oral TID AC & HS  . doxazosin  8 mg Oral QHS  . feeding supplement (PRO-STAT SUGAR FREE 64)  30 mL Oral TID  . gentamicin cream  1 application Topical Daily  . insulin aspart  0-5 Units Subcutaneous QHS  . insulin aspart  0-9 Units Subcutaneous TID WC  . insulin glargine  6 Units Subcutaneous QHS  . labetalol  100 mg Oral BID  . losartan  100 mg Oral Daily  . multivitamin  1 tablet Oral QHS  . pantoprazole (PROTONIX)  IV  40 mg Intravenous Q12H  . sevelamer carbonate  3,200 mg Oral TID WC   Continuous Infusions: . 0.9 % NaCl with KCl 20 mEq / L    . dialysis solution 1.5% low-MG/low-CA      Assessment/Plan:  1. Acute uremia with end-stage renal disease.  Peritoneal dialysis as per nephrology.  Nephrology reordered IV fluids.  Follow-up BMP daily 2. Lower abdominal pain and diarrhea.  I started cholestyramine and increased to twice daily dosing.  Patient interested in doing a trial of Bentyl.  Case discussed with nephrology to this consider sending off fluid from the peritoneal cavity. 3. Black stool that is guaiac positive.  Aspirin and heparin subcutaneous injections stopped.  On IV Protonix.  GI saw the patient in consultation would rather hold off on any procedures at this time.  Hemoglobin came down secondary to IV fluids given yesterday. 4. Weakness.  MRI of the brain 04/02/2019 negative for acute abnormality.  Still very weak with physical therapy.  Patient will likely go home with home health even though physical therapy recommends rehab. 5. Essential hypertension on amlodipine, labetalol, losartan and doxazosin 6. Recent COVID-19 infection.  Finished course of remdesivir and steroids. 7. Type 2 diabetes mellitus with end-stage renal disease on glargine insulin and sliding scale.  Code Status:     Code Status Orders  (From admission, onward)         Start     Ordered   04/03/19 2049  Full code  Continuous     04/03/19 2049        Code Status History    Date Active Date Inactive Code Status Order ID Comments User Context   03/25/2019 2244 03/30/2019 2251 Full Code [id number]  Reubin Milan, MD ED   12/18/2018 0536 12/20/2018 2021 Full Code [id number]  Oswald Hillock, MD ED   03/25/2018 0224 03/27/2018 1552 Full Code [id number]  Ivor Costa, MD ED   03/09/2018 1634 03/11/2018 1551 Full Code [id number]  Donnie Mesa, MD Inpatient   11/07/2017 0411 11/09/2017 1522 Full Code [id number]  Reubin Milan, MD Inpatient   Advance Care Planning Activity     Family Communication: Left message for husband on cell phone Disposition Plan: To be determined  Consultants:  Nephrology  Time spent: 27 minutes, case discussed with nephrology  [place]

## 2019-04-08 ENCOUNTER — Inpatient Hospital Stay: Payer: Medicare Other

## 2019-04-08 DIAGNOSIS — R42 Dizziness and giddiness: Secondary | ICD-10-CM

## 2019-04-08 LAB — CBC
HCT: 26.3 % — ABNORMAL LOW (ref 36.0–46.0)
Hemoglobin: 8.7 g/dL — ABNORMAL LOW (ref 12.0–15.0)
MCH: 22.4 pg — ABNORMAL LOW (ref 26.0–34.0)
MCHC: 33.1 g/dL (ref 30.0–36.0)
MCV: 67.8 fL — ABNORMAL LOW (ref 80.0–100.0)
Platelets: 144 10*3/uL — ABNORMAL LOW (ref 150–400)
RBC: 3.88 MIL/uL (ref 3.87–5.11)
RDW: 19.7 % — ABNORMAL HIGH (ref 11.5–15.5)
WBC: 9 10*3/uL (ref 4.0–10.5)
nRBC: 0 % (ref 0.0–0.2)

## 2019-04-08 LAB — BODY FLUID CELL COUNT WITH DIFFERENTIAL
Eos, Fluid: 3 %
Lymphs, Fluid: 6 %
Monocyte-Macrophage-Serous Fluid: 24 %
Neutrophil Count, Fluid: 67 %
Total Nucleated Cell Count, Fluid: 30 cu mm

## 2019-04-08 LAB — BASIC METABOLIC PANEL
Anion gap: 16 — ABNORMAL HIGH (ref 5–15)
BUN: 106 mg/dL — ABNORMAL HIGH (ref 6–20)
CO2: 21 mmol/L — ABNORMAL LOW (ref 22–32)
Calcium: 6.9 mg/dL — ABNORMAL LOW (ref 8.9–10.3)
Chloride: 93 mmol/L — ABNORMAL LOW (ref 98–111)
Creatinine, Ser: 12.94 mg/dL — ABNORMAL HIGH (ref 0.44–1.00)
GFR calc Af Amer: 3 mL/min — ABNORMAL LOW (ref >60)
GFR calc non Af Amer: 3 mL/min — ABNORMAL LOW (ref >60)
Glucose, Bld: 164 mg/dL — ABNORMAL HIGH (ref 70–99)
Potassium: 3.8 mmol/L (ref 3.5–5.1)
Sodium: 130 mmol/L — ABNORMAL LOW (ref 135–145)

## 2019-04-08 LAB — GLUCOSE, CAPILLARY
Glucose-Capillary: 161 mg/dL — ABNORMAL HIGH (ref 70–99)
Glucose-Capillary: 134 mg/dL — ABNORMAL HIGH (ref 70–99)
Glucose-Capillary: 128 mg/dL — ABNORMAL HIGH (ref 70–99)
Glucose-Capillary: 156 mg/dL — ABNORMAL HIGH (ref 70–99)

## 2019-04-08 IMAGING — CT CT ABD-PELV W/O CM
2 of 4 series · 16 of 46 positions shown, 18 images · non-contrast
Comparison: [DATE]

CLINICAL DATA: Abdominal pain, nausea. COVID positive. Peritoneal
dialysis. Heme-positive stools.

EXAM:
CT ABDOMEN AND PELVIS WITHOUT CONTRAST
TECHNIQUE: Multidetector CT imaging of the abdomen and pelvis was performed
following the standard protocol without IV contrast.

[Series 2: routine abd/(person_name) (person_name) · axial · 0.86mm/px · z∈[-533,-38]mm · 13 of 109 slices shown, 15 images]
[im 5/109  soft-tissue]
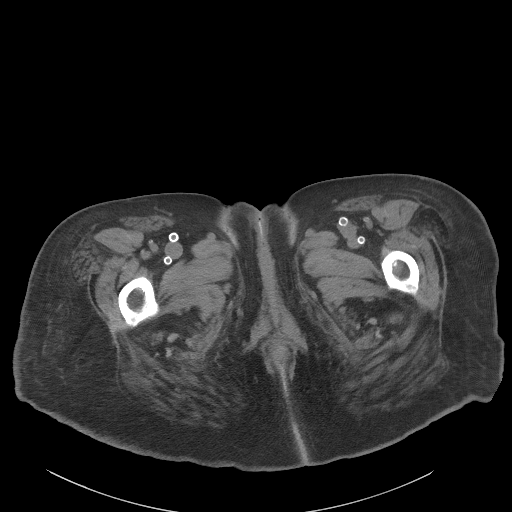
[im 5/109  bone]
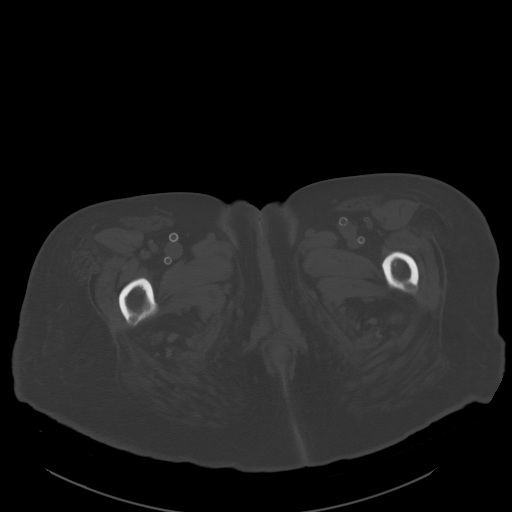
[im 14/109  soft-tissue]
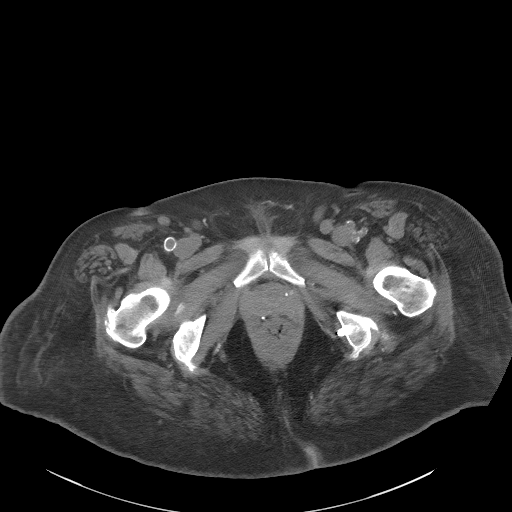
[im 23/109  soft-tissue]
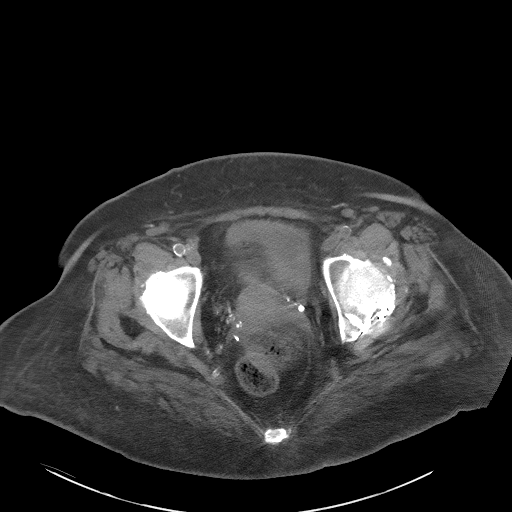
[im 32/109  soft-tissue]
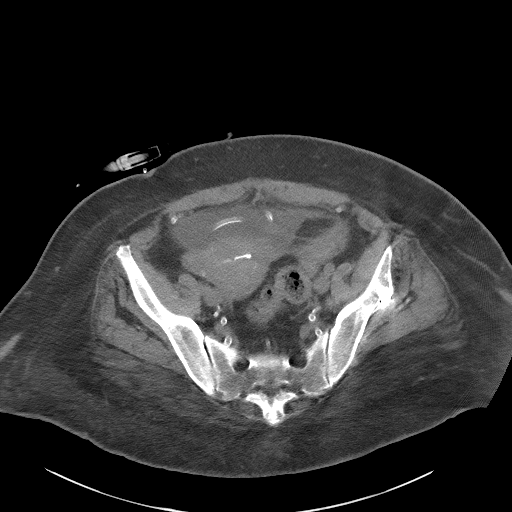
[im 37/109  soft-tissue]
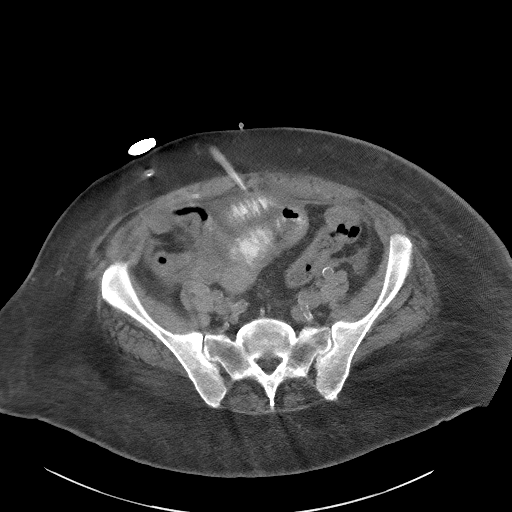
[im 46/109  soft-tissue]
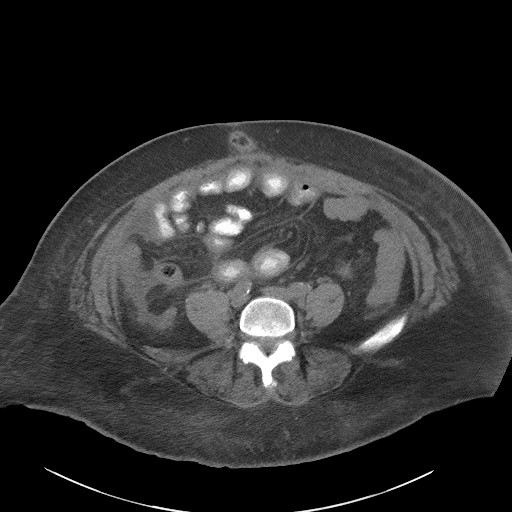
[im 55/109  soft-tissue]
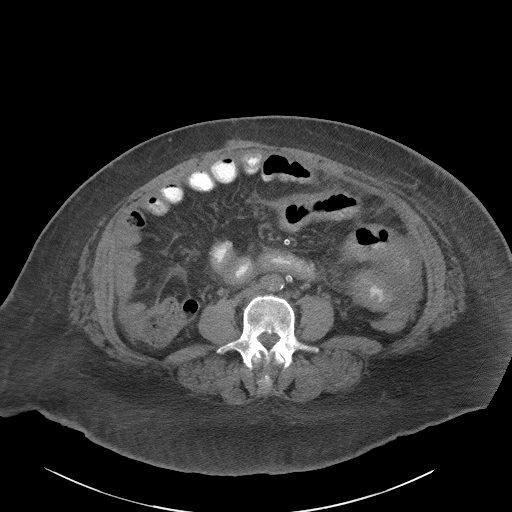
[im 64/109  soft-tissue]
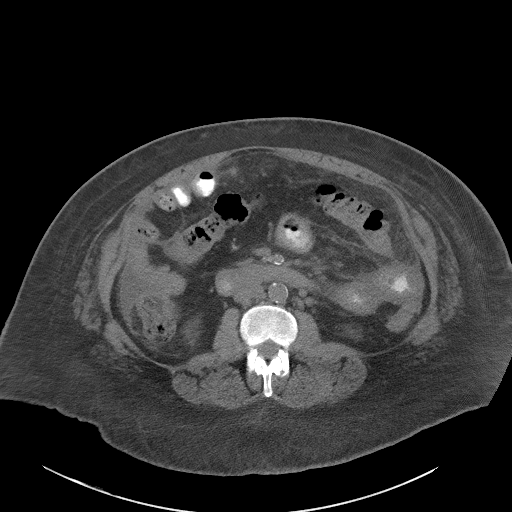
[im 73/109  soft-tissue]
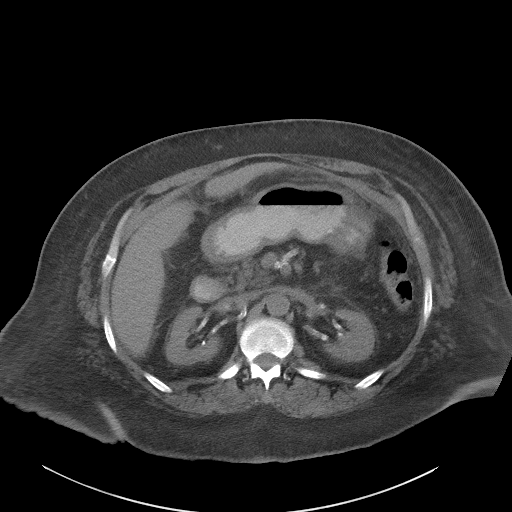
[im 73/109  bone]
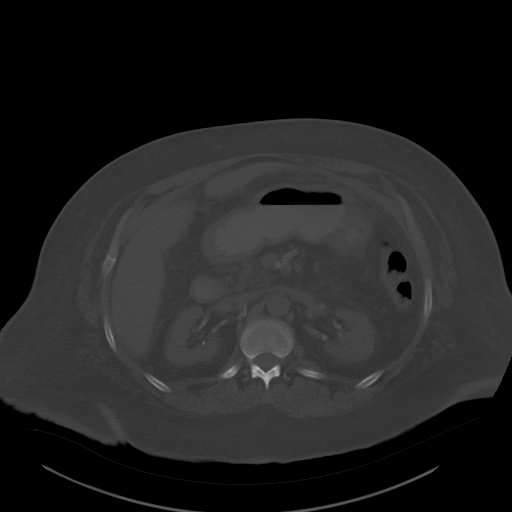
[im 77/109  soft-tissue]
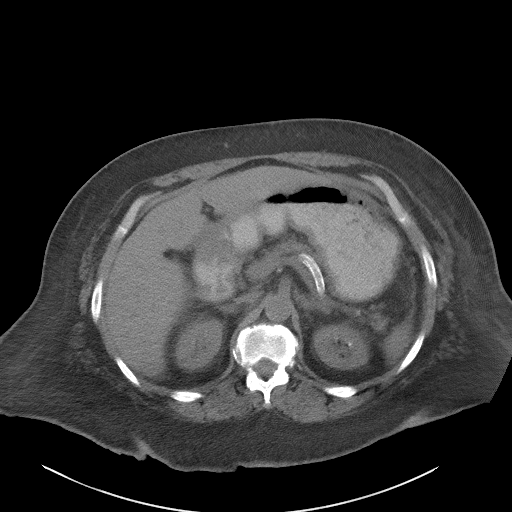
[im 86/109  soft-tissue]
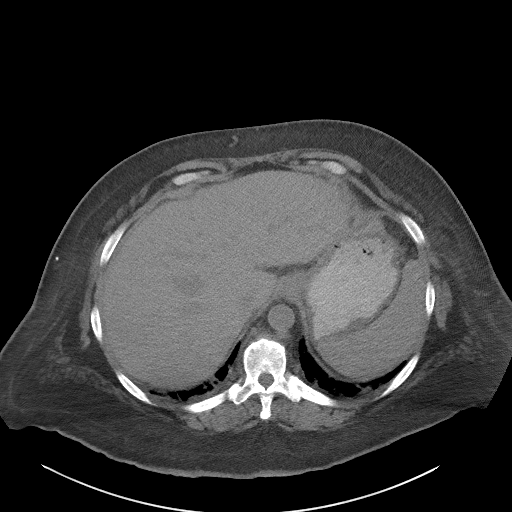
[im 95/109  soft-tissue]
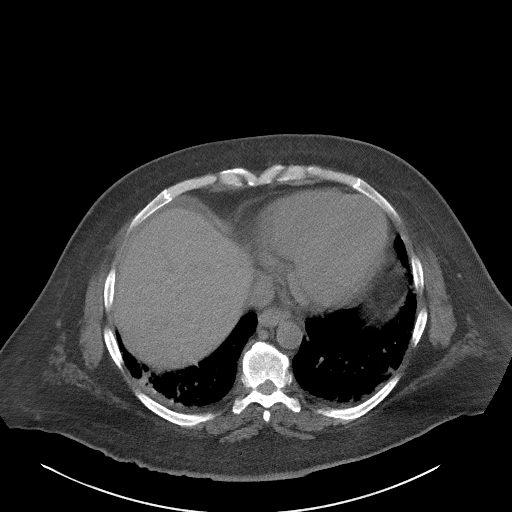
[im 104/109  soft-tissue]
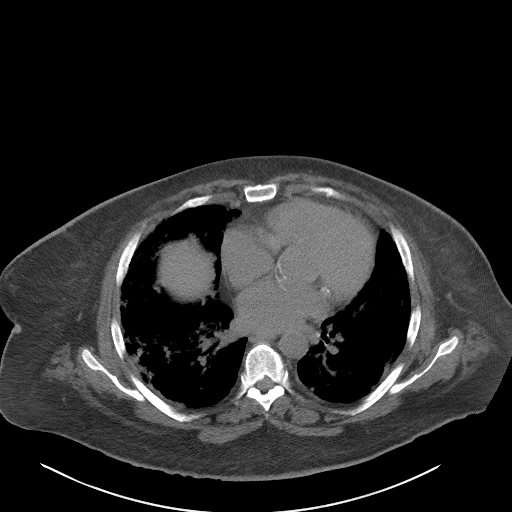

[Series 5: coronal st · coronal · 0.79mm/px · 3 of 106 slices shown]
[im 36/106  soft-tissue]
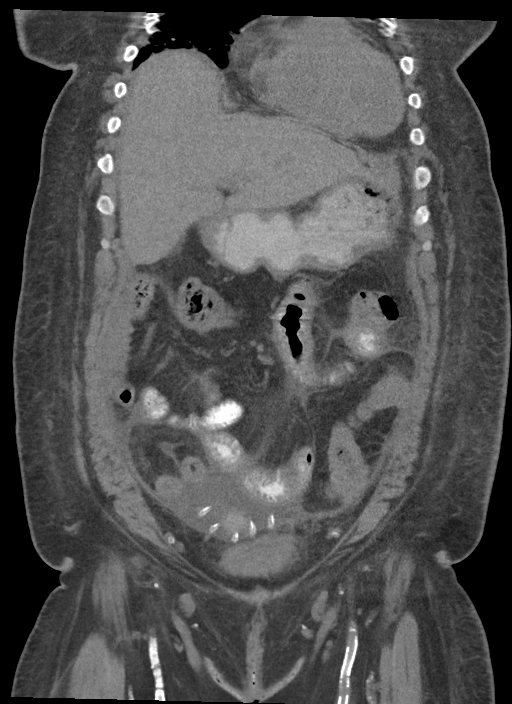
[im 47/106  soft-tissue]
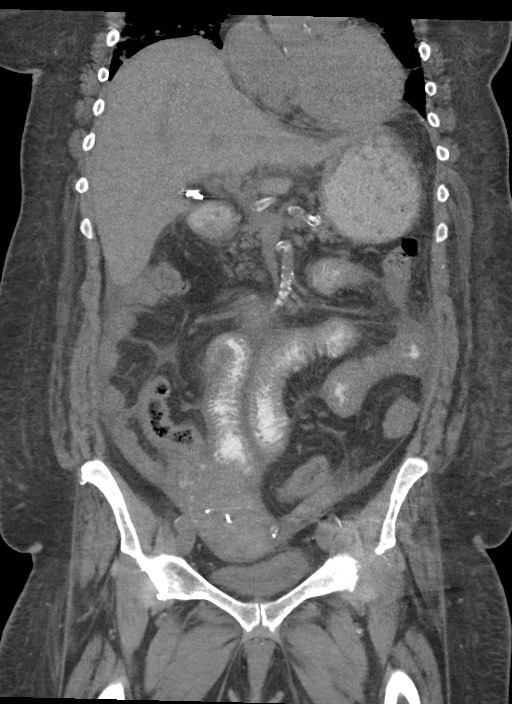
[im 59/106  soft-tissue]
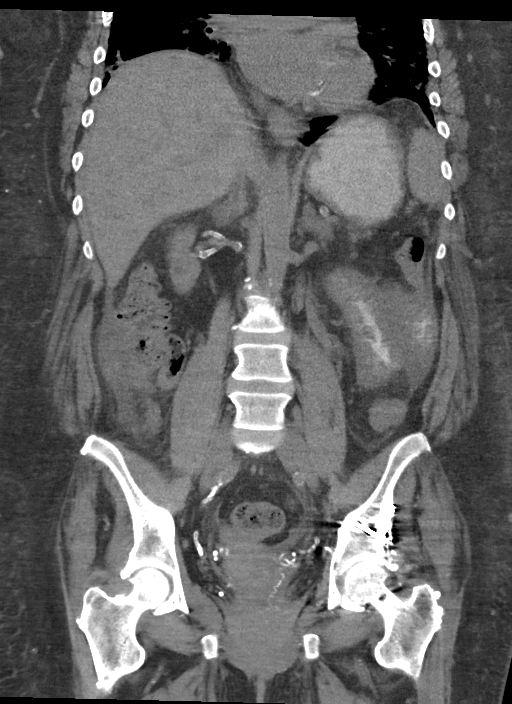

[16 of 46 positions shown; findings below may reference images not displayed]

FINDINGS: Lower chest: Extensive peripheral airspace disease within the lung
bases compatible with pneumonia. Borderline heart size. No
effusions.

Hepatobiliary: No focal liver abnormality is seen. Status post
cholecystectomy. No biliary dilatation.

Pancreas: No focal abnormality or ductal dilatation.

Spleen: No focal abnormality.  Normal size.

Adrenals/Urinary Tract: No adrenal abnormality. No focal renal
abnormality. No stones or hydronephrosis. Urinary bladder is
unremarkable.

Stomach/Bowel: There is wall thickening within several small bowel
loops best seen in the left abdomen compatible with enteritis.
Stomach and large bowel grossly unremarkable.

Vascular/Lymphatic: Calcified aorta. Heavily calcified mesenteric
and renal vessels. No aneurysm or adenopathy.

Reproductive: Uterus and adnexa unremarkable. No mass. IUD in the
uterus.

Other: Small amount of free fluid in the abdomen and pelvis.
Peritoneal dialysis catheter noted anteriorly in the lower pelvis.

Musculoskeletal: No acute bony abnormality.
IMPRESSION: Abnormal thick walled small bowel loops in the left abdomen
concerning for infectious or inflammatory enteritis.

Small amount of free fluid in the abdomen and pelvis, likely related
to peritoneal dialysis. Peritoneal dialysis catheter in the anterior
lower pelvis.

Heavily calcified aorta and branch vessels.

Patchy peripheral airspace disease in the lower lobes compatible
with pneumonia.

## 2019-04-08 MED ORDER — CALCIUM ACETATE (PHOS BINDER) 667 MG PO CAPS
1334.0000 mg | ORAL_CAPSULE | Freq: Three times a day (TID) | ORAL | Status: DC
  Administered 2019-04-08 – 2019-04-13 (×13): 1334 mg via ORAL
  Filled 2019-04-08 (×12): qty 2

## 2019-04-08 MED ORDER — RENA-VITE PO TABS
1.0000 | ORAL_TABLET | Freq: Every day | ORAL | Status: DC
  Administered 2019-04-09 – 2019-04-13 (×5): 1 via ORAL
  Filled 2019-04-08 (×5): qty 1

## 2019-04-08 MED ORDER — BOOST / RESOURCE BREEZE PO LIQD CUSTOM
1.0000 | Freq: Three times a day (TID) | ORAL | Status: DC
  Administered 2019-04-08 – 2019-04-13 (×13): 1 via ORAL

## 2019-04-08 MED ORDER — MECLIZINE HCL 12.5 MG PO TABS
12.5000 mg | ORAL_TABLET | Freq: Three times a day (TID) | ORAL | Status: DC
  Administered 2019-04-08 – 2019-04-13 (×16): 12.5 mg via ORAL
  Filled 2019-04-08 (×19): qty 1

## 2019-04-08 MED ORDER — LOPERAMIDE HCL 2 MG PO CAPS
4.0000 mg | ORAL_CAPSULE | Freq: Once | ORAL | Status: AC
  Administered 2019-04-08: 13:00:00 4 mg via ORAL
  Filled 2019-04-08: qty 2

## 2019-04-08 MED ORDER — IOHEXOL 9 MG/ML PO SOLN
500.0000 mL | ORAL | Status: AC
  Administered 2019-04-08 (×2): 500 mL via ORAL

## 2019-04-08 NOTE — Progress Notes (Signed)
[facility]  ROUNDING NOTE   Subjective:   Peritoneal dialysis treatment last night. UF was negative. 1.5% dextrose.   NS with 20KCl at 77mL/hr.   She states she is feeling better. But continues to have abdominal pain and nausea. States appetite has improved some.   Objective:  Vital signs in last 24 hours:  Temp:  [98 F (36.7 C)-98.7 F (37.1 C)] 98 F (36.7 C) (01/11 1418) Pulse Rate:  [76-84] 76 (01/11 1418) Resp:  [16-18] 18 (01/11 0518) BP: (119-121)/(56-76) 120/61 (01/11 1418) SpO2:  [94 %-100 %] 94 % (01/11 1418) Weight:  [110 kg] 110 kg (01/11 0700)  Weight change: 1.636 kg Filed Weights   04/06/19 0500 04/07/19 0456 04/08/19 0700  Weight: 103.7 kg 108.4 kg 110 kg    Intake/Output: I/O last 3 completed shifts: In: 642.5 [I.V.:642.5] Out: -    Intake/Output this shift:  Total I/O In: 681.4 [I.V.:681.4] Out: 80 [Other:80]  Physical Exam: General: NAD, laying in bed  Head: Normocephalic, atraumatic. Moist oral mucosal membranes  Eyes: Anicteric, PERRL  Neck: Supple, trachea midline  Lungs:  Clear to auscultation  Heart: Regular rate and rhythm  Abdomen:  Right and left lower quadrant tenderness   Extremities:  no peripheral edema.  Neurologic: Nonfocal, moving all four extremities  Skin: No lesions  Access: PD catheter    Basic Metabolic Panel: Recent Labs  Lab 04/03/19 1749 04/04/19 0535 04/06/19 0601 04/07/19 0514 04/08/19 0611  NA 129* 128* 131* 130* 130*  K 4.2 4.4 3.3* 3.6 3.8  CL 87* 87* 90* 90* 93*  CO2 18* 18* 19* 20* 21*  GLUCOSE 200* 207* 63* 176* 164*  BUN 132* 147* 128* 117* 106*  CREATININE 15.50* 16.24* 14.70* 14.00* 12.94*  CALCIUM 7.9* 7.6* 7.1* 7.0* 6.9*  PHOS  --  12.3*  --  7.9*  --     Liver Function Tests: Recent Labs  Lab 04/02/19 1714 04/04/19 0535 04/07/19 0514  AST 12*  --   --   ALT 9  --   --   ALKPHOS 78  --   --   BILITOT 0.5  --   --   PROT 6.3*  --   --   ALBUMIN 2.2* 2.3* 1.7*   No  results for input(s): LIPASE, AMYLASE in the last 168 hours. No results for input(s): AMMONIA in the last 168 hours.  CBC: Recent Labs  Lab 04/02/19 1714 04/03/19 1749 04/04/19 0535 04/04/19 1249 04/05/19 1306 04/06/19 0601 04/07/19 0514 04/08/19 0611  WBC 8.3 6.3 7.3  --   --  9.6 10.4 9.0  NEUTROABS 6.2  --   --   --   --   --   --   --   HGB 11.7* 12.1 11.6* 11.8* 10.7* 10.3* 9.0* 8.7*  HCT 36.1 36.1 35.5*  --   --  30.7* 26.7* 26.3*  MCV 68.2* 66.1* 66.9*  --   --  66.9* 66.1* 67.8*  PLT 258 263 237  --   --  173 141* 144*    Cardiac Enzymes: No results for input(s): CKTOTAL, CKMB, CKMBINDEX, TROPONINI in the last 168 hours.  BNP: Invalid input(s): POCBNP  CBG: Recent Labs  Lab 04/07/19 1129 04/07/19 1627 04/07/19 2055 04/08/19 0753 04/08/19 1143  GLUCAP 116* 112* 96 161* 134*    Microbiology: Results for orders placed or performed during the hospital encounter of 03/25/19  Respiratory Panel by RT PCR (Flu A&B, Covid) - Nasopharyngeal Swab     Status:  Abnormal   Collection Time: 03/25/19 11:32 PM   Specimen: Nasopharyngeal Swab  Result Value Ref Range Status   SARS Coronavirus 2 by RT PCR POSITIVE (A) NEGATIVE Final    Comment: RESULT CALLED TO, READ BACK BY AND VERIFIED WITH: EASTER,T. AT 0031 ON 03/26/2019 BY EVA (NOTE) SARS-CoV-2 target nucleic acids are DETECTED. SARS-CoV-2 RNA is generally detectable in upper respiratory specimens  during the acute phase of infection. Positive results are indicative of the presence of the identified virus, but do not rule out bacterial infection or co-infection with other pathogens not detected by the test. Clinical correlation with patient history and other diagnostic information is necessary to determine patient infection status. The expected result is Negative. Fact Sheet for Patients:  PinkCheek.be Fact Sheet for Healthcare Providers: GravelBags.it This  test is not yet approved or cleared by the [place] FDA and  has been authorized for detection and/or diagnosis of SARS-CoV-2 by FDA under an Emergency Use Authorization (EUA).  This EUA will remain in effect (meaning this test can be used ) for the duration of  the COVID-19 declaration under Section 564(b)(1) of the Act, 21 U.S.C. section 360bbb-3(b)(1), unless the authorization is terminated or revoked sooner.    Influenza A by PCR NEGATIVE NEGATIVE Final   Influenza B by PCR NEGATIVE NEGATIVE Final    Comment: (NOTE) The Xpert Xpress SARS-CoV-2/FLU/RSV assay is intended as an aid in  the diagnosis of influenza from Nasopharyngeal swab specimens and  should not be used as a sole basis for treatment. Nasal washings and  aspirates are unacceptable for Xpert Xpress SARS-CoV-2/FLU/RSV  testing. Fact Sheet for Patients: PinkCheek.be Fact Sheet for Healthcare Providers: GravelBags.it This test is not yet approved or cleared by the [place] FDA and  has been authorized for detection and/or diagnosis of SARS-CoV-2 by  FDA under an Emergency Use Authorization (EUA). This EUA will remain  in effect (meaning this test can be used) for the duration of the  Covid-19 declaration under Section 564(b)(1) of the Act, 21  U.S.C. section 360bbb-3(b)(1), unless the authorization is  terminated or revoked. Performed at [facility], [address] [place], [place] [address]     Coagulation Studies: No results for input(s): LABPROT, INR in the last 72 hours.  Urinalysis: No results for input(s): COLORURINE, LABSPEC, PHURINE, GLUCOSEU, HGBUR, BILIRUBINUR, KETONESUR, PROTEINUR, UROBILINOGEN, NITRITE, LEUKOCYTESUR in the last 72 hours.  Invalid input(s): APPERANCEUR    Imaging: No results found.   Medications:   . 0.9 % NaCl with KCl 20 mEq / L 75 mL/hr at 04/08/19 1039  . dialysis solution 1.5% low-MG/low-CA     .  amLODipine  10 mg Oral Daily  . calcium acetate  1,334 mg Oral TID WC  . cholestyramine  4 g Oral BID  . dicyclomine  10 mg Oral TID AC & HS  . doxazosin  8 mg Oral QHS  . feeding supplement  1 Container Oral TID BM  . feeding supplement (PRO-STAT SUGAR FREE 64)  30 mL Oral TID  . gentamicin cream  1 application Topical Daily  . insulin aspart  0-5 Units Subcutaneous QHS  . insulin aspart  0-9 Units Subcutaneous TID WC  . insulin glargine  6 Units Subcutaneous QHS  . labetalol  100 mg Oral BID  . losartan  100 mg Oral Daily  . meclizine  12.5 mg Oral TID  . [START ON 04/09/2019] multivitamin  1 tablet Oral QHS  . pantoprazole (PROTONIX) IV  40 mg  Intravenous Q12H   acetaminophen **OR** acetaminophen, Gerhardt's butt cream, Ipratropium-Albuterol, morphine injection, ondansetron (ZOFRAN) IV  Assessment/ Plan:  54 y.o. female   Janet Mitchell is a 54 y.o. black female with end stage renal disease on peritoneal dialysis, hypertension, diabetes mellitus type II, congestive heart failure who presents to [facility] on 04/03/2019 for Hyponatremia [E87.1] Elevated BUN [R79.9] ESRD (end stage renal disease) ([place]) [N18.6] ESRD on peritoneal dialysis ([place]) [N18.6, Z99.2] COVID-19 [U07.1] Black stool [A07.6]   Complicated admission to [facility] for COVID-19 infection  [facility] 114kg Peritoneal Dialysis CCPD 12 hours 2864mL fills 4 exchanges last fill of 2572mL extraneal  1. End Stage Renal Disease: on peritoneal dialysis. Negative ultrafiltration last night.  Exam and ultrafiltration consistent with dehydration/hypovolemia - IV fluids: NS with 20KCl at 4mL/hr   -Resume peritoneal dialysis tonight with home prescription, holding last fill with extraneal. Dextrose 1.5%.   2. Hypertension:  Home regimen of losartan, amlodipine, labetalol, doxazosin.   3. Anemia of chronic kidney disease: hemoglobin 8.7 Holding EPO - EPO to be scheduled this week subcutaneously.   4.  Secondary Hyperparathyroidism: with hyperphosphatemia 7.9 Corrected calcium at goal - discontinued calcitriol - discontinue home sevelamer 4 tablets with meals.  - resume calcium acetate    LOS: 4 Annaya Bangert 1/11/20212:30 PM

## 2019-04-08 NOTE — Progress Notes (Signed)
Patient ID: Janet Mitchell, female   DOB: 1965/06/12, 54 y.o.   MRN: [id number] [facility] PROGRESS NOTE  Brittlyn Cloe Mitchell CHE:[id number] DOB: 06/18/1965 DOA: 04/03/2019 PCP: Lucianne Lei, MD  HPI/Subjective: Patient had a rough night last night with diarrhea and abdominal pain.  When she sits up she gets dizzy with the room spinning.  Today feels better without abdominal pain.  When I saw her this morning she did not have any further diarrhea this morning.  Objective: Vitals:   04/08/19 0518 04/08/19 1418  BP: 121/76 120/61  Pulse: 84 76  Resp: 18   Temp: 98.7 F (37.1 C) 98 F (36.7 C)  SpO2: 97% 94%    Filed Weights   04/06/19 0500 04/07/19 0456 04/08/19 0700  Weight: 103.7 kg 108.4 kg 110 kg    ROS: Review of Systems  Constitutional: Positive for malaise/fatigue. Negative for chills and fever.  Eyes: Negative for blurred vision.  Respiratory: Negative for cough and shortness of breath.   Cardiovascular: Negative for chest pain.  Gastrointestinal: Positive for abdominal pain and diarrhea. Negative for constipation, nausea and vomiting.  Musculoskeletal: Negative for joint pain.  Neurological: Negative for dizziness and headaches.   Exam: Physical Exam  Constitutional: She is oriented to person, place, and time.  HENT:  Nose: No mucosal edema.  Mouth/Throat: No oropharyngeal exudate or posterior oropharyngeal edema.  Eyes: Conjunctivae and lids are normal.  Neck: Carotid bruit is not present.  Cardiovascular: S1 normal and S2 normal. Exam reveals no gallop.  No murmur heard. Pulses:      Dorsalis pedis pulses are 2+ on the right side and 2+ on the left side.  Respiratory: No respiratory distress. She has no wheezes. She has no rhonchi. She has no rales.  GI: Soft. Bowel sounds are normal. There is no abdominal tenderness.  Musculoskeletal:     Right ankle: No swelling.     Left ankle: No swelling.  Lymphadenopathy:    She has no cervical adenopathy.   Neurological: She is alert and oriented to person, place, and time. No cranial nerve deficit.  Skin: Skin is warm. No rash noted. Nails show no clubbing.  Psychiatric: She has a normal mood and affect.      Data Reviewed: Basic Metabolic Panel: Recent Labs  Lab 04/03/19 1749 04/04/19 0535 04/06/19 0601 04/07/19 0514 04/08/19 0611  NA 129* 128* 131* 130* 130*  K 4.2 4.4 3.3* 3.6 3.8  CL 87* 87* 90* 90* 93*  CO2 18* 18* 19* 20* 21*  GLUCOSE 200* 207* 63* 176* 164*  BUN 132* 147* 128* 117* 106*  CREATININE 15.50* 16.24* 14.70* 14.00* 12.94*  CALCIUM 7.9* 7.6* 7.1* 7.0* 6.9*  PHOS  --  12.3*  --  7.9*  --    Liver Function Tests: Recent Labs  Lab 04/02/19 1714 04/04/19 0535 04/07/19 0514  AST 12*  --   --   ALT 9  --   --   ALKPHOS 78  --   --   BILITOT 0.5  --   --   PROT 6.3*  --   --   ALBUMIN 2.2* 2.3* 1.7*   CBC: Recent Labs  Lab 04/02/19 1714 04/03/19 1749 04/04/19 0535 04/04/19 1249 04/05/19 1306 04/06/19 0601 04/07/19 0514 04/08/19 0611  WBC 8.3 6.3 7.3  --   --  9.6 10.4 9.0  NEUTROABS 6.2  --   --   --   --   --   --   --  HGB 11.7* 12.1 11.6* 11.8* 10.7* 10.3* 9.0* 8.7*  HCT 36.1 36.1 35.5*  --   --  30.7* 26.7* 26.3*  MCV 68.2* 66.1* 66.9*  --   --  66.9* 66.1* 67.8*  PLT 258 263 237  --   --  173 141* 144*    CBG: Recent Labs  Lab 04/07/19 1129 04/07/19 1627 04/07/19 2055 04/08/19 0753 04/08/19 1143  GLUCAP 116* 112* 96 161* 134*    Scheduled Meds: . amLODipine  10 mg Oral Daily  . calcium acetate  1,334 mg Oral TID WC  . cholestyramine  4 g Oral BID  . dicyclomine  10 mg Oral TID AC & HS  . doxazosin  8 mg Oral QHS  . feeding supplement  1 Container Oral TID BM  . feeding supplement (PRO-STAT SUGAR FREE 64)  30 mL Oral TID  . gentamicin cream  1 application Topical Daily  . insulin aspart  0-5 Units Subcutaneous QHS  . insulin aspart  0-9 Units Subcutaneous TID WC  . insulin glargine  6 Units Subcutaneous QHS  . iohexol   500 mL Oral Q1H  . labetalol  100 mg Oral BID  . losartan  100 mg Oral Daily  . meclizine  12.5 mg Oral TID  . [START ON 04/09/2019] multivitamin  1 tablet Oral QHS  . pantoprazole (PROTONIX) IV  40 mg Intravenous Q12H   Continuous Infusions: . 0.9 % NaCl with KCl 20 mEq / L 75 mL/hr at 04/08/19 1039  . dialysis solution 1.5% low-MG/low-CA      Assessment/Plan:  1. Acute uremia with end-stage renal disease.  Peritoneal dialysis as per nephrology.  On IV fluids as per nephrology.  BUN still very high at 106. 2. Lower abdominal pain and diarrhea.  I started cholestyramine and increased to twice daily dosing.  Patient interested in doing a trial of Bentyl.  Nephrology sent off peritoneal fluid.  CT scan of the abdomen pelvis ordered. 3. Black stool that is guaiac positive.  Aspirin and heparin subcutaneous injections stopped.  On IV Protonix.  GI saw the patient in consultation would rather hold off on any procedures at this time.  Drop in hemoglobin now likely dilutional with IV fluids. 4. Weakness.  MRI of the brain 04/02/2019 negative for acute abnormality.  Still very weak with physical therapy.  Patient will likely go home with home health.  Patient still has not walked well yet. 5. Essential hypertension on amlodipine, labetalol, losartan and doxazosin 6. Recent COVID-19 infection.  Finished course of remdesivir and steroids. 7. Type 2 diabetes mellitus with end-stage renal disease on glargine insulin and sliding scale. 8. Vertigo.  Trial of Antivert  Code Status:     Code Status Orders  (From admission, onward)         Start     Ordered   04/03/19 2049  Full code  Continuous     04/03/19 2049        Code Status History    Date Active Date Inactive Code Status Order ID Comments User Context   03/25/2019 2244 03/30/2019 2251 Full Code [id number]  Reubin Milan, MD ED   12/18/2018 0536 12/20/2018 2021 Full Code [id number]  Oswald Hillock, MD ED   03/25/2018 0224 03/27/2018 1552  Full Code [id number]  Ivor Costa, MD ED   03/09/2018 1634 03/11/2018 1551 Full Code [id number]  Donnie Mesa, MD Inpatient   11/07/2017 0411 11/09/2017 1522 Full Code [id number]  Reubin Milan, MD Inpatient  Advance Care Planning Activity     Disposition Plan: Would like to see the patient do better with physical therapy  Consultants:  Nephrology  Time spent: 27 minutes, case discussed with nephrology  [place]

## 2019-04-08 NOTE — Progress Notes (Signed)
Pd completed 

## 2019-04-08 NOTE — Progress Notes (Signed)
Pd started 

## 2019-04-08 NOTE — Progress Notes (Signed)
Nutrition Follow Up Note   DOCUMENTATION CODES:   Obesity unspecified  INTERVENTION:   Boost Breeze po TID, each supplement provides 250 kcal and 9 grams of protein  Prostat liquid protein PO 30 ml TID with meals, each supplement provides 100 kcal, 15 grams protein.  Magic cup TID with meals, each supplement provides 290 kcal and 9 grams of protein  Rena-vite daily   Phosphorus restricted diet via health touch   NUTRITION DIAGNOSIS:   Increased nutrient needs related to catabolic illness(COVID 19, ESRD on PD) as evidenced by increased estimated needs.  GOAL:   Patient will meet greater than or equal to 90% of their needs  -not met   MONITOR:   PO intake, Supplement acceptance, Labs, Weight trends, Skin, I & O's  ASSESSMENT:   54 y.o. female with medical history significant for ESRD on peritoneal dialysis, insulin-dependent type 2 diabetes, hypertension, and recent admission for COVID-19 infection admitted with weakness and slurred speech   Spoke with patient via phone. Pt reports that her appetite is improved; pt ate some soup and grilled cheese for lunch today but now her stomach is upset and she is reporting nausea and diarrhea. Pt reports that she can not drink milky supplements as they cause her diarrhea. Pt is willing to try Boost Breeze. Pt has been taking some prostat but she is refusing most of it. Per chart, pt is up ~13lbs since admit. Pt getting PD; will change rena-vite to mornings.   Medications reviewed and include: questran, insulin, meclizine, rena-vite, protonix, NaCl w/ KCl _0 /hr  Labs reviewed: Na 130(L), Cl 93(L), BUN 106(H), creat 12.94(H), P 7.9(H) Folate- 5.6(L), B12 377- 1/9 Hgb 8.7(L), Hct 26.3(L), MCV 67.8(L), MCH 22.4(L) cbgs- 116, 112, 96, 161, 134 x 24 hrs AIC 8.5(H)- 1/6  Diet Order:   Diet Order            Diet Carb Modified Fluid consistency: Thin; Room service appropriate? Yes; Fluid restriction: 1800 mL Fluid  Diet effective now              EDUCATION NEEDS:   Education needs have been addressed  Skin:  Skin Assessment: Reviewed RN Assessment(ecchymosis)  Last BM:  1/11- type 7  Height:   Ht Readings from Last 1 Encounters:  04/03/19 _1  (1.727 m)    Weight:   Wt Readings from Last 1 Encounters:  04/08/19 110 kg    Ideal Body Weight:  63.6 kg  BMI:  Body mass index is 36.87 kg/m.  Estimated Nutritional Needs:   Kcal:  2200-2500kcal/day  Protein:  110-125g/day  Fluid:  UOP +1L  Koleen Distance MS, RD, LDN Pager #- [phone] Office#- [phone] After Hours Pager: [phone]

## 2019-04-09 DIAGNOSIS — E875 Hyperkalemia: Secondary | ICD-10-CM

## 2019-04-09 DIAGNOSIS — I959 Hypotension, unspecified: Secondary | ICD-10-CM

## 2019-04-09 DIAGNOSIS — D62 Acute posthemorrhagic anemia: Secondary | ICD-10-CM

## 2019-04-09 LAB — CBC
HCT: 21.5 % — ABNORMAL LOW (ref 36.0–46.0)
Hemoglobin: 7 g/dL — ABNORMAL LOW (ref 12.0–15.0)
MCH: 22.5 pg — ABNORMAL LOW (ref 26.0–34.0)
MCHC: 32.6 g/dL (ref 30.0–36.0)
MCV: 69.1 fL — ABNORMAL LOW (ref 80.0–100.0)
Platelets: 131 10*3/uL — ABNORMAL LOW (ref 150–400)
RBC: 3.11 MIL/uL — ABNORMAL LOW (ref 3.87–5.11)
RDW: 19.3 % — ABNORMAL HIGH (ref 11.5–15.5)
WBC: 8.2 10*3/uL (ref 4.0–10.5)
nRBC: 0 % (ref 0.0–0.2)

## 2019-04-09 LAB — TYPE AND SCREEN
ABO/RH(D): A POS
Antibody Screen: NEGATIVE
Unit division: 0
Unit division: 0

## 2019-04-09 LAB — GLUCOSE, CAPILLARY
Glucose-Capillary: 135 mg/dL — ABNORMAL HIGH (ref 70–99)
Glucose-Capillary: 58 mg/dL — ABNORMAL LOW (ref 70–99)
Glucose-Capillary: 84 mg/dL (ref 70–99)
Glucose-Capillary: 107 mg/dL — ABNORMAL HIGH (ref 70–99)
Glucose-Capillary: 124 mg/dL — ABNORMAL HIGH (ref 70–99)

## 2019-04-09 LAB — BPAM RBC
Blood Product Expiration Date: 202101272359
Blood Product Expiration Date: 202101272359
Unit Type and Rh: 5100
Unit Type and Rh: 5100

## 2019-04-09 LAB — RENAL FUNCTION PANEL
Albumin: 1.4 g/dL — ABNORMAL LOW (ref 3.5–5.0)
Anion gap: 9 (ref 5–15)
BUN: 82 mg/dL — ABNORMAL HIGH (ref 6–20)
CO2: 18 mmol/L — ABNORMAL LOW (ref 22–32)
Calcium: 5.8 mg/dL — CL (ref 8.9–10.3)
Chloride: 106 mmol/L (ref 98–111)
Creatinine, Ser: 10 mg/dL — ABNORMAL HIGH (ref 0.44–1.00)
GFR calc Af Amer: 5 mL/min — ABNORMAL LOW (ref >60)
GFR calc non Af Amer: 4 mL/min — ABNORMAL LOW (ref >60)
Glucose, Bld: 59 mg/dL — ABNORMAL LOW (ref 70–99)
Phosphorus: 4.7 mg/dL — ABNORMAL HIGH (ref 2.5–4.6)
Potassium: 6.6 mmol/L (ref 3.5–5.1)
Sodium: 133 mmol/L — ABNORMAL LOW (ref 135–145)

## 2019-04-09 LAB — ABO/RH: ABO/RH(D): A POS

## 2019-04-09 LAB — PATHOLOGIST SMEAR REVIEW

## 2019-04-09 MED ORDER — ACETAMINOPHEN 325 MG PO TABS
650.0000 mg | ORAL_TABLET | Freq: Once | ORAL | Status: DC
  Filled 2019-04-09: qty 2

## 2019-04-09 MED ORDER — OXYCODONE HCL 5 MG PO TABS
5.0000 mg | ORAL_TABLET | ORAL | Status: DC | PRN
  Administered 2019-04-09 – 2019-04-10 (×3): 5 mg via ORAL
  Filled 2019-04-09 (×3): qty 1

## 2019-04-09 MED ORDER — SODIUM CHLORIDE 0.9 % IV SOLN
2.0000 g | INTRAVENOUS | Status: DC
  Administered 2019-04-09 – 2019-04-10 (×2): 2 g via INTRAVENOUS
  Filled 2019-04-09 (×2): qty 20

## 2019-04-09 MED ORDER — EPOETIN ALFA 10000 UNIT/ML IJ SOLN
20000.0000 [IU] | Freq: Once | INTRAMUSCULAR | Status: AC
  Administered 2019-04-09: 20000 [IU] via SUBCUTANEOUS
  Filled 2019-04-09: qty 2

## 2019-04-09 MED ORDER — CIPROFLOXACIN IN D5W 400 MG/200ML IV SOLN
400.0000 mg | INTRAVENOUS | Status: DC
  Filled 2019-04-09: qty 200

## 2019-04-09 MED ORDER — SODIUM CHLORIDE 0.9 % IV SOLN: INTRAVENOUS | Status: DC

## 2019-04-09 MED ORDER — DICYCLOMINE HCL 20 MG PO TABS
20.0000 mg | ORAL_TABLET | Freq: Three times a day (TID) | ORAL | Status: DC
  Administered 2019-04-09 – 2019-04-12 (×11): 20 mg via ORAL
  Filled 2019-04-09 (×13): qty 1

## 2019-04-09 MED ORDER — METRONIDAZOLE IN NACL 5-0.79 MG/ML-% IV SOLN
500.0000 mg | Freq: Three times a day (TID) | INTRAVENOUS | Status: DC
  Administered 2019-04-09 – 2019-04-10 (×4): 500 mg via INTRAVENOUS
  Filled 2019-04-09 (×6): qty 100

## 2019-04-09 MED ORDER — DEXTROSE 50 % IV SOLN
25.0000 g | Freq: Once | INTRAVENOUS | Status: AC
  Administered 2019-04-09: 25 g via INTRAVENOUS
  Filled 2019-04-09: qty 50

## 2019-04-09 MED ORDER — SODIUM CHLORIDE 0.9% IV SOLUTION: Freq: Once | INTRAVENOUS | Status: AC

## 2019-04-09 NOTE — Progress Notes (Signed)
[facility]  ROUNDING NOTE   Subjective:   Peritoneal dialysis treatment last night. UF was negative. 1.5% dextrose.  There was some fibrin in the effluent.   NS infusion stopped due to hyperkalemia  Hypoglycemia today.   Objective:  Vital signs in last 24 hours:  Temp:  [97.6 F (36.4 C)-98.6 F (37 C)] 98 F (36.7 C) (01/12 1300) Pulse Rate:  [70-78] 70 (01/12 1300) Resp:  [16] 16 (01/12 0343) BP: (99-125)/(50-68) 99/50 (01/12 1300) SpO2:  [90 %-98 %] 90 % (01/12 1300) Weight:  [115.1 kg] 115.1 kg (01/12 0339)  Weight change: 5.1 kg Filed Weights   04/07/19 0456 04/08/19 0700 04/09/19 0339  Weight: 108.4 kg 110 kg 115.1 kg    Intake/Output: I/O last 3 completed shifts: In: 2503.3 [I.V.:2503.3] Out: 80 [Other:80]   Intake/Output this shift:  No intake/output data recorded.  Physical Exam: General: NAD, laying in bed  Head: Normocephalic, atraumatic. Moist oral mucosal membranes  Eyes: Anicteric, PERRL  Neck: Supple, trachea midline  Lungs:  Clear to auscultation  Heart: Regular rate and rhythm  Abdomen:  Right and left lower quadrant tenderness   Extremities:  no peripheral edema.  Neurologic: Nonfocal, moving all four extremities  Skin: No lesions  Access: PD catheter    Basic Metabolic Panel: Recent Labs  Lab 04/04/19 0535 04/06/19 0601 04/07/19 0514 04/08/19 0611 04/09/19 1144  NA 128* 131* 130* 130* 133*  K 4.4 3.3* 3.6 3.8 6.6*  CL 87* 90* 90* 93* 106  CO2 18* 19* 20* 21* 18*  GLUCOSE 207* 63* 176* 164* 59*  BUN 147* 128* 117* 106* 82*  CREATININE 16.24* 14.70* 14.00* 12.94* 10.00*  CALCIUM 7.6* 7.1* 7.0* 6.9* 5.8*  PHOS 12.3*  --  7.9*  --  4.7*    Liver Function Tests: Recent Labs  Lab 04/02/19 1714 04/04/19 0535 04/07/19 0514 04/09/19 1144  AST 12*  --   --   --   ALT 9  --   --   --   ALKPHOS 78  --   --   --   BILITOT 0.5  --   --   --   PROT 6.3*  --   --   --   ALBUMIN 2.2* 2.3* 1.7* 1.4*   No results for  input(s): LIPASE, AMYLASE in the last 168 hours. No results for input(s): AMMONIA in the last 168 hours.  CBC: Recent Labs  Lab 04/02/19 1714 04/04/19 0535 04/05/19 1306 04/06/19 0601 04/07/19 0514 04/08/19 0611 04/09/19 1144  WBC 8.3 7.3  --  9.6 10.4 9.0 8.2  NEUTROABS 6.2  --   --   --   --   --   --   HGB 11.7* 11.6* 10.7* 10.3* 9.0* 8.7* 7.0*  HCT 36.1 35.5*  --  30.7* 26.7* 26.3* 21.5*  MCV 68.2* 66.9*  --  66.9* 66.1* 67.8* 69.1*  PLT 258 237  --  173 141* 144* 131*    Cardiac Enzymes: No results for input(s): CKTOTAL, CKMB, CKMBINDEX, TROPONINI in the last 168 hours.  BNP: Invalid input(s): POCBNP  CBG: Recent Labs  Lab 04/08/19 2031 04/09/19 0809 04/09/19 1257 04/09/19 1339 04/09/19 1644  GLUCAP 156* 135* 58* 84 107*    Microbiology: Results for orders placed or performed during the hospital encounter of 04/03/19  Body fluid culture     Status: None (Preliminary result)   Collection Time: 04/06/19 11:55 AM   Specimen: Body Fluid  Result Value Ref Range Status  Specimen Description   Final    FLUID Performed at [facility], [place] [address], [place], [place] [address]    Special Requests   Final    PERITONEAL Performed at [facility], [place], [place] [address]    Gram Stain NO WBC SEEN NO ORGANISMS SEEN   Final   Culture   Final    NO GROWTH < 12 HOURS Performed at [facility], [place] [address], [place],  [address]    Report Status PENDING  Incomplete    Coagulation Studies: No results for input(s): LABPROT, INR in the last 72 hours.  Urinalysis: No results for input(s): COLORURINE, LABSPEC, PHURINE, GLUCOSEU, HGBUR, BILIRUBINUR, KETONESUR, PROTEINUR, UROBILINOGEN, NITRITE, LEUKOCYTESUR in the last 72 hours.  Invalid input(s): APPERANCEUR    Imaging: CT ABDOMEN PELVIS WO CONTRAST  Result Date: 04/08/2019 CLINICAL DATA:  Abdominal pain, nausea. COVID positive. Peritoneal dialysis.  Heme-positive stools. EXAM: CT ABDOMEN AND PELVIS WITHOUT CONTRAST TECHNIQUE: Multidetector CT imaging of the abdomen and pelvis was performed following the standard protocol without IV contrast. COMPARISON:  03/25/2018 FINDINGS: Lower chest: Extensive peripheral airspace disease within the lung bases compatible with pneumonia. Borderline heart size. No effusions. Hepatobiliary: No focal liver abnormality is seen. Status post cholecystectomy. No biliary dilatation. Pancreas: No focal abnormality or ductal dilatation. Spleen: No focal abnormality.  Normal size. Adrenals/Urinary Tract: No adrenal abnormality. No focal renal abnormality. No stones or hydronephrosis. Urinary bladder is unremarkable. Stomach/Bowel: There is wall thickening within several small bowel loops best seen in the left abdomen compatible with enteritis. Stomach and large bowel grossly unremarkable. Vascular/Lymphatic: Calcified aorta. Heavily calcified mesenteric and renal vessels. No aneurysm or adenopathy. Reproductive: Uterus and adnexa unremarkable. No mass. IUD in the uterus. Other: Small amount of free fluid in the abdomen and pelvis. Peritoneal dialysis catheter noted anteriorly in the lower pelvis. Musculoskeletal: No acute bony abnormality. IMPRESSION: Abnormal thick walled small bowel loops in the left abdomen concerning for infectious or inflammatory enteritis. Small amount of free fluid in the abdomen and pelvis, likely related to peritoneal dialysis. Peritoneal dialysis catheter in the anterior lower pelvis. Heavily calcified aorta and branch vessels. Patchy peripheral airspace disease in the lower lobes compatible with pneumonia. Electronically Signed   By: Rolm Baptise M.D.   On: 04/08/2019 18:48     Medications:   . sodium chloride 50 mL/hr at 04/09/19 1521  . cefTRIAXone (ROCEPHIN)  IV 2 g (04/09/19 1042)  . dialysis solution 1.5% low-MG/low-CA    . metronidazole 500 mg (04/09/19 1524)   . sodium chloride   Intravenous  Once  . acetaminophen  650 mg Oral Once  . calcium acetate  1,334 mg Oral TID WC  . cholestyramine  4 g Oral BID  . dicyclomine  10 mg Oral TID AC & HS  . feeding supplement  1 Container Oral TID BM  . feeding supplement (PRO-STAT SUGAR FREE 64)  30 mL Oral TID  . gentamicin cream  1 application Topical Daily  . insulin aspart  0-5 Units Subcutaneous QHS  . insulin aspart  0-9 Units Subcutaneous TID WC  . meclizine  12.5 mg Oral TID  . multivitamin  1 tablet Oral QHS  . pantoprazole (PROTONIX) IV  40 mg Intravenous Q12H   acetaminophen **OR** acetaminophen, Gerhardt's butt cream, Ipratropium-Albuterol, ondansetron (ZOFRAN) IV, oxyCODONE  Assessment/ Plan:  54 y.o. female   [name] is a 54 y.o. black female with end stage renal disease  on peritoneal dialysis, hypertension, diabetes mellitus type II, congestive heart failure who presents to [facility] on 04/03/2019 for Hyponatremia [E87.1] Elevated BUN [R79.9] ESRD (end stage renal disease) ([place]) [N18.6] ESRD on peritoneal dialysis ([place]) [N18.6, Z99.2] COVID-19 [U07.1] Black stool [J15.9]   Complicated admission to [facility] for COVID-19 infection  [facility] 114kg Peritoneal Dialysis CCPD 12 hours 2860mL fills 4 exchanges last fill of 2551mL extraneal  1. End Stage Renal Disease: on peritoneal dialysis. Negative ultrafiltration last night.  Exam and ultrafiltration consistent with dehydration/hypovolemia - IV fluids: NS at 69mL/hr -Continue peritoneal dialysis tonight with home prescription, holding last fill with extraneal. Dextrose 1.5%.   2. Hypertension:  Home regimen of losartan, amlodipine, labetalol, doxazosin.  Currently holding all agents. Suspect patient is hypovolemic  3. Anemia of chronic kidney disease: hemoglobin dropped - EPO to be scheduled today subcutaneously.  - Appreciate GI input.  - PRBC transfusion as per Hospitalist team   4. Secondary Hyperparathyroidism: with hyperphosphatemia   - calcium acetate    LOS: 5 Ariyona Eid 1/12/20215:08 PM

## 2019-04-09 NOTE — Plan of Care (Signed)
No complaints of abdominal pain last night.  Still very weak and desires to work with PT to get stronger.  Will continue to monitor.  Janet Mitchell

## 2019-04-09 NOTE — Progress Notes (Signed)
Pharmacy Antibiotic Note  Janet Mitchell is a 54 y.o. female admitted on 04/03/2019 with intra-abdominal infx.  Pharmacy has been consulted for ciprofloxacin dosing.  Plan: Will start cipro 400 mg IV q24h since patient is ESRD on PD. QTc 483 on EKG will continue to monitor.  Height: 5\' 8"  (172.7 cm) Weight: 253 lb 12 oz (115.1 kg) IBW/kg (Calculated) : 63.9  Temp (24hrs), Avg:98.1 F (36.7 C), Min:97.6 F (36.4 C), Max:98.6 F (37 C)  Recent Labs  Lab 04/03/19 1749 04/04/19 0535 04/06/19 0601 04/07/19 0514 04/08/19 0611  WBC 6.3 7.3 9.6 10.4 9.0  CREATININE 15.50* 16.24* 14.70* 14.00* 12.94*    Estimated Creatinine Clearance: 6.9 mL/min (A) (by C-G formula based on SCr of 12.94 mg/dL (H)).    No Known Allergies  Thank you for allowing pharmacy to be a part of this patient's care.  Tobie Lords, PharmD, BCPS Clinical Pharmacist 04/09/2019 7:28 AM

## 2019-04-09 NOTE — Progress Notes (Addendum)
ID Discussed with Dr.Wieting regarding cdiff test for this patient. On chart review and talking to Dr.Wieting: Pt with ESRD on peritoneal dialysis was admitted with encephalopathy and found to have increased urea and creatinine Pt has chronic diarrhea for the past year after her cholecystectomy surgery as per note She was recently in the hospital between 12/28-1/2 with diarrhea and also had covid test positive- She got one dose on azithromycin on 12/28 and 5 doses of ceftriaxone and treated with remdisivir and decadron and sent hom on 5 more days of decadron She was admitted to [facility] on 04/03/19 with fatigue , nausea and vomiting and unable to perform HD at home due to fatigue . So she presented to the ED  She currently has no fever, no leucocytosis and CT abdomen done on 04/08/19 shows wall thickening within several small bowel loops reported as compatible with enteritis  and interestingly no large bowel thickening which is seen in cdiff . So by history, Clinically  , labs and by  Imaging the picture is not cdiff colitis. Hence Cdiff test not needed currently. Pt has black stool and  fecal blood positive- so Gi bleeding another concern. If abdominal tenderness or pain is present r/o peritonitis  due to PD. Chronic Diarrhea D.D could be autonomic diarrhea from ESRD .

## 2019-04-09 NOTE — Progress Notes (Signed)
CCPD tx started    04/09/19 1633  Peritoneal Catheter Right lower abdomen  No Placement Date or Time found.   Catheter Location: Right lower abdomen  Catheter status Accessed  Cycler Setup  Total Number of Exchanges 4  Fill Volume 2000  Last Fill Volume 0  Fill Time - Minute(s) 10  Dwell Time - Hour(s) 2  Dwell Time - Minute(s) 42  Drain Time - Minute(s) 20 mins  Exit Site Care Performed Yes  Completion  Treatment Status Started

## 2019-04-09 NOTE — Progress Notes (Signed)
Patient ID: Janet Mitchell, female   DOB: 1965/09/08, 54 y.o.   MRN: [id number] [facility] PROGRESS NOTE  [name] IDP:[id number] DOB: 1966-01-13 DOA: 04/03/2019 PCP: Lucianne Lei, MD  HPI/Subjective:   Objective: Vitals:   04/09/19 0343 04/09/19 1300  BP: 121/68 (!) 99/50  Pulse: 78 70  Resp: 16   Temp: 98.6 F (37 C) 98 F (36.7 C)  SpO2: 98% 90%    Filed Weights   04/07/19 0456 04/08/19 0700 04/09/19 0339  Weight: 108.4 kg 110 kg 115.1 kg    ROS: Review of Systems  Constitutional: Positive for malaise/fatigue. Negative for chills and fever.  Eyes: Negative for blurred vision.  Respiratory: Negative for cough and shortness of breath.   Cardiovascular: Negative for chest pain.  Gastrointestinal: Positive for abdominal pain, diarrhea and nausea. Negative for constipation and vomiting.  Musculoskeletal: Negative for joint pain.  Neurological: Negative for dizziness and headaches.   Exam: Physical Exam  Constitutional: She is oriented to person, place, and time.  HENT:  Nose: No mucosal edema.  Mouth/Throat: No oropharyngeal exudate or posterior oropharyngeal edema.  Eyes: Conjunctivae and lids are normal.  Neck: Carotid bruit is not present.  Cardiovascular: S1 normal and S2 normal. Exam reveals no gallop.  No murmur heard. Pulses:      Dorsalis pedis pulses are 2+ on the right side and 2+ on the left side.  Respiratory: No respiratory distress. She has no wheezes. She has no rhonchi. She has no rales.  GI: Soft. Bowel sounds are normal. There is no abdominal tenderness.  Musculoskeletal:     Right ankle: No swelling.     Left ankle: No swelling.  Lymphadenopathy:    She has no cervical adenopathy.  Neurological: She is alert and oriented to person, place, and time. No cranial nerve deficit.  Skin: Skin is warm. No rash noted. Nails show no clubbing.  Psychiatric: She has a normal mood and affect.      Data Reviewed: Basic Metabolic  Panel: Recent Labs  Lab 04/04/19 0535 04/06/19 0601 04/07/19 0514 04/08/19 0611 04/09/19 1144  NA 128* 131* 130* 130* 133*  K 4.4 3.3* 3.6 3.8 6.6*  CL 87* 90* 90* 93* 106  CO2 18* 19* 20* 21* 18*  GLUCOSE 207* 63* 176* 164* 59*  BUN 147* 128* 117* 106* 82*  CREATININE 16.24* 14.70* 14.00* 12.94* 10.00*  CALCIUM 7.6* 7.1* 7.0* 6.9* 5.8*  PHOS 12.3*  --  7.9*  --  4.7*   Liver Function Tests: Recent Labs  Lab 04/02/19 1714 04/04/19 0535 04/07/19 0514 04/09/19 1144  AST 12*  --   --   --   ALT 9  --   --   --   ALKPHOS 78  --   --   --   BILITOT 0.5  --   --   --   PROT 6.3*  --   --   --   ALBUMIN 2.2* 2.3* 1.7* 1.4*   CBC: Recent Labs  Lab 04/02/19 1714 04/04/19 0535 04/05/19 1306 04/06/19 0601 04/07/19 0514 04/08/19 0611 04/09/19 1144  WBC 8.3 7.3  --  9.6 10.4 9.0 8.2  NEUTROABS 6.2  --   --   --   --   --   --   HGB 11.7* 11.6* 10.7* 10.3* 9.0* 8.7* 7.0*  HCT 36.1 35.5*  --  30.7* 26.7* 26.3* 21.5*  MCV 68.2* 66.9*  --  66.9* 66.1* 67.8* 69.1*  PLT 258 237  --  173 141* 144* 131*    CBG: Recent Labs  Lab 04/08/19 1655 04/08/19 2031 04/09/19 0809 04/09/19 1257 04/09/19 1339  GLUCAP 128* 156* 135* 58* 84    Scheduled Meds: . amLODipine  10 mg Oral Daily  . calcium acetate  1,334 mg Oral TID WC  . cholestyramine  4 g Oral BID  . dicyclomine  10 mg Oral TID AC & HS  . doxazosin  8 mg Oral QHS  . epoetin (EPOGEN/PROCRIT) injection  20,000 Units Subcutaneous Once  . feeding supplement  1 Container Oral TID BM  . feeding supplement (PRO-STAT SUGAR FREE 64)  30 mL Oral TID  . gentamicin cream  1 application Topical Daily  . insulin aspart  0-5 Units Subcutaneous QHS  . insulin aspart  0-9 Units Subcutaneous TID WC  . labetalol  100 mg Oral BID  . losartan  100 mg Oral Daily  . meclizine  12.5 mg Oral TID  . multivitamin  1 tablet Oral QHS  . pantoprazole (PROTONIX) IV  40 mg Intravenous Q12H   Continuous Infusions: . sodium chloride 50 mL/hr  at 04/09/19 1521  . cefTRIAXone (ROCEPHIN)  IV 2 g (04/09/19 1042)  . dialysis solution 1.5% low-MG/low-CA    . metronidazole 500 mg (04/09/19 1524)    Assessment/Plan:  1. Acute uremia with end-stage renal disease.  Peritoneal dialysis as per nephrology.  On IV fluids as per nephrology.  2. Lower abdominal pain and diarrhea.  Ct scan shows enteritis.  I started rocephin and flagyl this am.  Apreciate GI re-consult. Continue cholestyramine and Bentyl.  Nephrology sent off peritoneal fluid to lab.  CT scan of the abdomen pelvis ordered.  Case discussed with infectious disease doctor since there is no white count and no fever there is no need for C. difficile testing at this time since CAT scan did not show involvement of the colon 3. Acute blood loss anemia.  Black stool that is guaiac positive.  Aspirin and heparin subcutaneous injections stopped.  On IV Protonix.  Appreciate GI consultation.  May end up needing a blood transfusion tomorrow with hemoglobin dropping down with IV fluids being given.  Since hemoglobin is 7.0 blood pressure is low I will transfuse 1 unit of packed red blood cells over 4 hours. 4. Hyperkalemia potassium up at 6.6 today peritoneal dialysis as per nephrology 5. Weakness.  MRI of the brain 04/02/2019 negative for acute abnormality.  Still very weak with physical therapy.  Patient will likely go home with home health.  Patient still has not walked well yet. 6. Essential hypertension.  With blood pressure on the lower side will hold antihypertensive medications. 7. Recent COVID-19 infection.  Finished course of remdesivir and steroids. 8. Type 2 diabetes mellitus with end-stage renal disease.  Hold Lantus with sugars being low and the patient not eating. 9. Vertigo.  Trial of Antivert  Code Status:     Code Status Orders  (From admission, onward)         Start     Ordered   04/03/19 2049  Full code  Continuous     04/03/19 2049        Code Status History    Date  Active Date Inactive Code Status Order ID Comments User Context   03/25/2019 2244 03/30/2019 2251 Full Code [id number]  Janet Milan, MD ED   12/18/2018 0536 12/20/2018 2021 Full Code [id number]  Janet Hillock, MD ED   03/25/2018 0224 03/27/2018 1552 Full Code [id number]  Janet Costa, MD ED   03/09/2018 1634 03/11/2018 1551 Full Code [id number]  Donnie Mesa, MD Inpatient   11/07/2017 0411 11/09/2017 1522 Full Code [id number]  Janet Milan, MD Inpatient   Advance Care Planning Activity     Disposition Plan: Would like to see the patient do better with physical therapy  Consultants:  Nephrology  Time spent: 27 minutes, case discussed with nephrology  [place]

## 2019-04-09 NOTE — Progress Notes (Signed)
Pre CCPD Assessment    04/09/19 1629  Neurological  Level of Consciousness Alert  Orientation Level Oriented X4  Respiratory  Respiratory Pattern Regular;Unlabored  Chest Assessment Chest expansion symmetrical  Bilateral Breath Sounds Diminished  Cardiac  Pulse Regular  Heart Sounds S1, S2  Vascular  R Radial Pulse +2  Edema Generalized  Peritoneal Catheter Right lower abdomen  No Placement Date or Time found.   Catheter Location: Right lower abdomen  Site Assessment Clean;Dry;Intact  Drainage Description None  Catheter status Deaccessed  Dressing Gauze/Drain sponge  Dressing Status Clean;Dry;Intact  Psychosocial  Psychosocial (WDL) WDL  Patient Behaviors Cooperative;Calm

## 2019-04-09 NOTE — Progress Notes (Signed)
Cephas Darby, MD [address]  [place]  [place], [place] [address]  Main: [phone]  Fax: [phone] Pager: [phone]   Subjective: I was reconsulted to see this patient again today due to lower abdominal pain associated with increased bowel frequency.  And abdominal CT which was performed yesterday due to worsening of lower abdominal pain which revealed abnormal thick walled small bowel loops concerning for infectious or inflammatory enteritis.  Therefore GI is reconsulted for further evaluation. Patient is empirically started on ceftriaxone and metronidazole for enteritis  When I interviewed the patient, she had a soft mushy brown bowel movement and waiting to be cleaned.  RN bedside.  Apparently, she has been experiencing postprandial urgency with loose bowel movement for almost 1 year, since she had gallbladder removed in 02/2018.  She underwent colonoscopy with biopsies which were unremarkable She never undergone EGD.  She has been experiencing burning in her upper abdomen shooting down to lower abdomen for which she takes calcium as well as Tums which helps with her bowel movement.  She also takes Imodium as needed as outpatient.  As inpatient, she is started on Bentyl, cholestyramine Of note, patient has severe hypoalbuminemia 1.4 since 2019  Objective:  Vital signs in last 24 hours: Vitals:   04/08/19 2034 04/09/19 0339 04/09/19 0343 04/09/19 1300  BP: 125/63  121/68 (!) 99/50  Pulse: 74  78 70  Resp:   16   Temp: 97.6 F (36.4 C)  98.6 F (37 C) 98 F (36.7 C)  TempSrc: Oral  Oral   SpO2: 96%  98% 90%  Weight:  115.1 kg    Height:       Weight change: 5.1 kg  Intake/Output Summary (Last 24 hours) at 04/09/2019 1736 Last data filed at 04/09/2019 0422 Gross per 24 hour  Intake 1289.41 ml  Output --  Net 1289.41 ml     Exam: Periorbital puffiness, appears pale Heart:: Regular rate and rhythm, S1S2 present or without murmur or extra heart  sounds Lungs: normal and clear to auscultation Abdomen: soft, nontender, normal bowel sounds   Lab Results: CBC Latest Ref Rng & Units 04/09/2019 04/08/2019 04/07/2019  WBC 4.0 - 10.5 K/uL 8.2 9.0 10.4  Hemoglobin 12.0 - 15.0 g/dL 7.0(L) 8.7(L) 9.0(L)  Hematocrit 36.0 - 46.0 % 21.5(L) 26.3(L) 26.7(L)  Platelets 150 - 400 K/uL 131(L) 144(L) 141(L)   CMP Latest Ref Rng & Units 04/09/2019 04/08/2019 04/07/2019  Glucose 70 - 99 mg/dL 59(L) 164(H) 176(H)  BUN 6 - 20 mg/dL 82(H) 106(H) 117(H)  Creatinine 0.44 - 1.00 mg/dL 10.00(H) 12.94(H) 14.00(H)  Sodium 135 - 145 mmol/L 133(L) 130(L) 130(L)  Potassium 3.5 - 5.1 mmol/L 6.6(HH) 3.8 3.6  Chloride 98 - 111 mmol/L 106 93(L) 90(L)  CO2 22 - 32 mmol/L 18(L) 21(L) 20(L)  Calcium 8.9 - 10.3 mg/dL 5.8(LL) 6.9(L) 7.0(L)  Total Protein 6.5 - 8.1 g/dL - - -  Total Bilirubin 0.3 - 1.2 mg/dL - - -  Alkaline Phos 38 - 126 U/L - - -  AST 15 - 41 U/L - - -  ALT 0 - 44 U/L - - -    Micro Results: Recent Results (from the past 240 hour(s))  Body fluid culture     Status: None (Preliminary result)   Collection Time: 04/06/19 11:55 AM   Specimen: Body Fluid  Result Value Ref Range Status   Specimen Description   Final    FLUID Performed at [facility], [address] [place], [place] [address]    Special Requests   Final    PERITONEAL Performed at [facility], [place], [place] [address]    Gram Stain NO WBC SEEN NO ORGANISMS SEEN   Final   Culture   Final    NO GROWTH < 12 HOURS Performed at [facility], [place] [address] [place],  [address]    Report Status PENDING  Incomplete   Studies/Results: CT ABDOMEN PELVIS WO CONTRAST  Result Date: 04/08/2019 CLINICAL DATA:  Abdominal pain, nausea. COVID positive. Peritoneal dialysis. Heme-positive stools. EXAM: CT ABDOMEN AND PELVIS WITHOUT CONTRAST TECHNIQUE: Multidetector CT imaging of the abdomen and pelvis was performed following the standard protocol  without IV contrast. COMPARISON:  03/25/2018 FINDINGS: Lower chest: Extensive peripheral airspace disease within the lung bases compatible with pneumonia. Borderline heart size. No effusions. Hepatobiliary: No focal liver abnormality is seen. Status post cholecystectomy. No biliary dilatation. Pancreas: No focal abnormality or ductal dilatation. Spleen: No focal abnormality.  Normal size. Adrenals/Urinary Tract: No adrenal abnormality. No focal renal abnormality. No stones or hydronephrosis. Urinary bladder is unremarkable. Stomach/Bowel: There is wall thickening within several small bowel loops best seen in the left abdomen compatible with enteritis. Stomach and large bowel grossly unremarkable. Vascular/Lymphatic: Calcified aorta. Heavily calcified mesenteric and renal vessels. No aneurysm or adenopathy. Reproductive: Uterus and adnexa unremarkable. No mass. IUD in the uterus. Other: Small amount of free fluid in the abdomen and pelvis. Peritoneal dialysis catheter noted anteriorly in the lower pelvis. Musculoskeletal: No acute bony abnormality. IMPRESSION: Abnormal thick walled small bowel loops in the left abdomen concerning for infectious or inflammatory enteritis. Small amount of free fluid in the abdomen and pelvis, likely related to peritoneal dialysis. Peritoneal dialysis catheter in the anterior lower pelvis. Heavily calcified aorta and branch vessels. Patchy peripheral airspace disease in the lower lobes compatible with pneumonia. Electronically Signed   By: Rolm Baptise M.D.   On: 04/08/2019 18:48   Medications:  I have reviewed the patient's current medications. Prior to Admission:  Medications Prior to Admission  Medication Sig Dispense Refill Last Dose  . ALPRAZolam (XANAX) 0.5 MG tablet Take 0.5 mg by mouth at bedtime as needed for anxiety.   04/02/2019 at Unknown time  . amLODipine (NORVASC) 10 MG tablet Take 1 tablet (10 mg total) by mouth daily. 30 tablet 3 04/02/2019 at Unknown time  .  ascorbic acid (VITAMIN C) 500 MG tablet Take 1 tablet (500 mg total) by mouth daily. 10 tablet 0 04/02/2019 at Unknown time  . aspirin EC 81 MG tablet Take 81 mg by mouth daily.   04/02/2019 at Unknown time  . calcitRIOL (ROCALTROL) 0.5 MCG capsule Take 0.5-1 mcg by mouth See admin instructions. 0.35mg on Mondays through Fridays and take 119m on Saturdays and Sundays   04/02/2019 at Unknown time  . Crisaborole (EUCRISA) 2 % OINT Apply 1 application topically daily as needed (for rash/irritation).   04/02/2019 at Unknown time  . doxazosin (CARDURA) 8 MG tablet Take 8 mg by mouth at bedtime.   04/02/2019 at Unknown time  . Hyoscyamine Sulfate SL (LEVSIN/SL) 0.125 MG SUBL Place 0.125 mg under the tongue 3 (three) times daily as needed (diarrhea). 90 tablet 3 04/02/2019 at Unknown time  . Insulin Glargine, 2 Unit Dial, 300 UNIT/ML SOPN Inject 20 Units into the skin at bedtime. 3 pen 2 04/02/2019 at 2000  . Ipratropium-Albuterol (COMBIVENT) 20-100 MCG/ACT AERS respimat Inhale 2 puffs into the lungs  every 6 (six) hours as needed for wheezing or shortness of breath. 4 g 0 04/02/2019 at Unknown time  . labetalol (NORMODYNE) 200 MG tablet Take 200 mg by mouth 2 (two) times daily.   04/02/2019 at Unknown time  . losartan (COZAAR) 100 MG tablet Take 100 mg by mouth daily.   04/02/2019 at Unknown time  . zinc sulfate 220 (50 Zn) MG capsule Take 1 capsule (220 mg total) by mouth daily. 10 capsule 0 04/02/2019 at Unknown time  . Blood Glucose Monitoring Suppl (ACCU-CHEK GUIDE) w/Device KIT 1 Piece by Does not apply route as directed. (Patient not taking: Reported on 03/26/2019) 1 kit 0   . glucose blood (ACCU-CHEK GUIDE) test strip Use as instructed 150 each 2    Scheduled: . sodium chloride   Intravenous Once  . acetaminophen  650 mg Oral Once  . calcium acetate  1,334 mg Oral TID WC  . cholestyramine  4 g Oral BID  . dicyclomine  20 mg Oral TID AC & HS  . feeding supplement  1 Container Oral TID BM  . feeding supplement  (PRO-STAT SUGAR FREE 64)  30 mL Oral TID  . gentamicin cream  1 application Topical Daily  . insulin aspart  0-5 Units Subcutaneous QHS  . insulin aspart  0-9 Units Subcutaneous TID WC  . meclizine  12.5 mg Oral TID  . multivitamin  1 tablet Oral QHS  . pantoprazole (PROTONIX) IV  40 mg Intravenous Q12H   Continuous: . sodium chloride 50 mL/hr at 04/09/19 1521  . cefTRIAXone (ROCEPHIN)  IV 2 g (04/09/19 1042)  . dialysis solution 1.5% low-MG/low-CA    . metronidazole 500 mg (04/09/19 1524)   NFA:[id number] **OR** acetaminophen, Gerhardt's butt cream, Ipratropium-Albuterol, ondansetron (ZOFRAN) IV, oxyCODONE Anti-infectives (From admission, onward)   Start     Dose/Rate Route Frequency Ordered Stop   04/09/19 1000  cefTRIAXone (ROCEPHIN) 2 g in sodium chloride 0.9 % 100 mL IVPB     2 g 200 mL/hr over 30 Minutes Intravenous Every 24 hours 04/09/19 0741     04/09/19 0800  metroNIDAZOLE (FLAGYL) IVPB 500 mg     500 mg 100 mL/hr over 60 Minutes Intravenous Every 8 hours 04/09/19 0711     04/09/19 0730  ciprofloxacin (CIPRO) IVPB 400 mg  Status:  Discontinued     400 mg 200 mL/hr over 60 Minutes Intravenous Every 24 hours 04/09/19 0727 04/09/19 0741     Scheduled Meds: . sodium chloride   Intravenous Once  . acetaminophen  650 mg Oral Once  . calcium acetate  1,334 mg Oral TID WC  . cholestyramine  4 g Oral BID  . dicyclomine  20 mg Oral TID AC & HS  . feeding supplement  1 Container Oral TID BM  . feeding supplement (PRO-STAT SUGAR FREE 64)  30 mL Oral TID  . gentamicin cream  1 application Topical Daily  . insulin aspart  0-5 Units Subcutaneous QHS  . insulin aspart  0-9 Units Subcutaneous TID WC  . meclizine  12.5 mg Oral TID  . multivitamin  1 tablet Oral QHS  . pantoprazole (PROTONIX) IV  40 mg Intravenous Q12H   Continuous Infusions: . sodium chloride 50 mL/hr at 04/09/19 1521  . cefTRIAXone (ROCEPHIN)  IV 2 g (04/09/19 1042)  . dialysis solution 1.5% low-MG/low-CA      . metronidazole 500 mg (04/09/19 1524)   PRN Meds:.acetaminophen **OR** acetaminophen, Gerhardt's butt cream, Ipratropium-Albuterol, ondansetron (ZOFRAN) IV, oxyCODONE   Assessment:  Principal Problem:   ESRD on peritoneal dialysis [facility]) Active Problems:   Essential hypertension   Type 2 diabetes mellitus with ESRD (end-stage renal disease) (HCC)   Chronic diarrhea   Black stool   Uremia, acute   Weakness   Vertigo   Acute blood loss anemia   Hyperkalemia   Hypotension   Plan: Chronic nonbloody diarrhea, generalized abdominal pain Most likely bile salt associated diarrhea.  Patient has severe hypoalbuminemia, resulting in bowel wall edema and third space loss likely explanation for thickened small bowel loops seen on the CT scan.  She does not have leukocytosis.  And the diarrhea has been chronic, less likely infectious etiology Colonoscopy with biopsies in 2020 did not reveal microscopic colitis or inflammatory bowel disease Recommend to discontinue antibiotics from GI standpoint Recommend to continue cholestyramine 2 g 2-3 times a day Increase Bentyl to 20 mg before each meal and at bedtime She should follow-up with GI as outpatient  Upper abdominal burning pain Continue Protonix 40 mg IV twice daily Check H. pylori IgG Check celiac panel  Severe hypoalbuminemia:  Likely combination of peritoneal dialysis and poor p.o. intake Chronic diarrhea is bile salt induced postcholecystectomy, not secondary to malabsorption No evidence of liver disease  GI will follow along with you   LOS: 5 days   Kajal Scalici 04/09/2019, 5:36 PM

## 2019-04-10 DIAGNOSIS — K529 Noninfective gastroenteritis and colitis, unspecified: Secondary | ICD-10-CM

## 2019-04-10 LAB — CBC
HCT: 27.8 % — ABNORMAL LOW (ref 36.0–46.0)
Hemoglobin: 9.4 g/dL — ABNORMAL LOW (ref 12.0–15.0)
MCH: 23.1 pg — ABNORMAL LOW (ref 26.0–34.0)
MCHC: 33.8 g/dL (ref 30.0–36.0)
MCV: 68.3 fL — ABNORMAL LOW (ref 80.0–100.0)
Platelets: 141 10*3/uL — ABNORMAL LOW (ref 150–400)
RBC: 4.07 MIL/uL (ref 3.87–5.11)
RDW: 19.5 % — ABNORMAL HIGH (ref 11.5–15.5)
WBC: 9.2 10*3/uL (ref 4.0–10.5)
nRBC: 0 % (ref 0.0–0.2)

## 2019-04-10 LAB — RENAL FUNCTION PANEL
Albumin: 1.6 g/dL — ABNORMAL LOW (ref 3.5–5.0)
Anion gap: 14 (ref 5–15)
BUN: 88 mg/dL — ABNORMAL HIGH (ref 6–20)
CO2: 22 mmol/L (ref 22–32)
Calcium: 7.2 mg/dL — ABNORMAL LOW (ref 8.9–10.3)
Chloride: 94 mmol/L — ABNORMAL LOW (ref 98–111)
Creatinine, Ser: 11.04 mg/dL — ABNORMAL HIGH (ref 0.44–1.00)
GFR calc Af Amer: 4 mL/min — ABNORMAL LOW (ref >60)
GFR calc non Af Amer: 4 mL/min — ABNORMAL LOW (ref >60)
Glucose, Bld: 152 mg/dL — ABNORMAL HIGH (ref 70–99)
Phosphorus: 5.6 mg/dL — ABNORMAL HIGH (ref 2.5–4.6)
Potassium: 4.1 mmol/L (ref 3.5–5.1)
Sodium: 130 mmol/L — ABNORMAL LOW (ref 135–145)

## 2019-04-10 LAB — GLUCOSE, CAPILLARY
Glucose-Capillary: 124 mg/dL — ABNORMAL HIGH (ref 70–99)
Glucose-Capillary: 117 mg/dL — ABNORMAL HIGH (ref 70–99)
Glucose-Capillary: 137 mg/dL — ABNORMAL HIGH (ref 70–99)
Glucose-Capillary: 120 mg/dL — ABNORMAL HIGH (ref 70–99)

## 2019-04-10 MED ORDER — SODIUM CHLORIDE 0.9 % IV SOLN: INTRAVENOUS | Status: DC | PRN

## 2019-04-10 MED ORDER — OXYCODONE HCL 5 MG PO TABS
10.0000 mg | ORAL_TABLET | ORAL | Status: DC | PRN
  Administered 2019-04-11 – 2019-04-12 (×2): 10 mg via ORAL
  Filled 2019-04-10 (×3): qty 2

## 2019-04-10 NOTE — Progress Notes (Signed)
Occupational Therapy Treatment Patient Details Name: Janet Mitchell MRN: [id number] DOB: December 13, 1965 Today's Date: 04/10/2019    History of present illness 54 y.o. female with medical history significant for ESRD on peritoneal dialysis, insulin-dependent type 2 diabetes, hypertension, and recent admission for COVID-19 infection 03/25/2019-03/30/2019 who presents to the ED for dialysis needs.  Since d/c she has been to the ED multiple times with general weakness and slurred speech. Imaging has been negative, pt reports she continues to be very weak.   OT comments  Pt seen for OT tx this date to f/u re: safety with self care ADLs and ADL mobility. OT facilitates pt participation in seated EOB grooming/hygiene tasks to improve pt's tolerance for seated functional activity. Pt performs sup to sit with CGA and guidance for hand placement. Pt tolerates EOB sitting x17 mins during fxl tasks and prepping for sit to stand. Pt able to stand with MIN A from slightly elevated bed level. While pt is requiring some assistance, still anticipate that d/c home with HHOT will be appropriate as pt reports she will have family assistance as well.    Follow Up Recommendations  Home health OT;Supervision/Assistance - 24 hour    Equipment Recommendations  3 in 1 bedside commode;Other (comment)(grab bars in shower)    Recommendations for Other Services      Precautions / Restrictions Precautions Precautions: Fall Restrictions Weight Bearing Restrictions: No       Mobility Bed Mobility Overal bed mobility: Needs Assistance Bed Mobility: Supine to Sit;Sit to Supine     Supine to sit: Min guard Sit to supine: Min guard   General bed mobility comments: extended time required  Transfers Overall transfer level: Needs assistance Equipment used: Rolling walker (2 wheeled) Transfers: Sit to/from Stand Sit to Stand: Min assist;From elevated surface         General transfer comment: Pt requries increased  time in EOB sitting to prep for sit to stand. Pt preps by scooting to EOB I'ly. Surface elevated to reduce pt's abdominal pain that worsens with mobilization.    Balance Overall balance assessment: Needs assistance Sitting-balance support: Single extremity supported Sitting balance-Leahy Scale: Fair       Standing balance-Leahy Scale: Poor Standing balance comment: Tolerates ~30 second stand this date, UEs shaking to support weight through RW, pt with better control of descent back to bed for stand to sit.                           ADL either performed or assessed with clinical judgement   ADL Overall ADL's : Needs assistance/impaired     Grooming: Wash/dry hands;Wash/dry face;Oral care;Set up;Sitting Grooming Details (indicate cue type and reason): tolerates EOB sitting x17 mins while completing grooming/hygiene tasks as well as prepping for sit<>stand transfer trial. Upper Body Bathing: Moderate assistance;Sitting Upper Body Bathing Details (indicate cue type and reason): Pt requires MOD A to thoroughly complete, partially d/t limited tolerance for UE ROM secondary to c/o lower abdominal pain (reported to RN)                                 Vision Patient Visual Report: No change from baseline     Perception     Praxis      Cognition Arousal/Alertness: Awake/alert Behavior During Therapy: WFL for tasks assessed/performed Overall Cognitive Status: Within Functional Limits for tasks assessed  General Comments: still with some delayed response time. Pt appears primarily distracted d/t nausea/dizzy feeling this date when she is delayed in responding.        Exercises Other Exercises Other Exercises: OT facilitates education re: safe hand placement with use of RW for sit<>stand   Shoulder Instructions       General Comments      Pertinent Vitals/ Pain       Pain Assessment: 0-10 Pain Score: 8   Pain Location: lower abdomen Pain Descriptors / Indicators: Aching;Grimacing;Guarding Pain Intervention(s): Limited activity within patient's tolerance;Monitored during session;Repositioned;Other (comment)(notified RN as this appears to be new pain sight per patient's report, and in addition, informed RN that pt requesting heat pack.)  Home Living Family/patient expects to be discharged to:: Private residence Living Arrangements: Spouse/significant other Available Help at Discharge: Family Type of Home: House Home Access: Stairs to enter Technical brewer of Steps: 3 Entrance Stairs-Rails: Left Home Layout: One level     Bathroom Shower/Tub: Teacher, early years/pre: [place]: Tub bench          Prior Functioning/Environment              Frequency  Min 1X/week        Progress Toward Goals  OT Goals(current goals can now be found in the care plan section)  Progress towards OT goals: Progressing toward goals  Acute Rehab OT Goals Patient Stated Goal: have more energy to do for herself OT Goal Formulation: With patient Time For Goal Achievement: 04/19/19 Potential to Achieve Goals: Good  Plan Discharge plan remains appropriate    Co-evaluation                 AM-PAC OT "6 Clicks" Daily Activity     Outcome Measure   Help from another person eating meals?: None Help from another person taking care of personal grooming?: A Little Help from another person toileting, which includes using toliet, bedpan, or urinal?: A Lot Help from another person bathing (including washing, rinsing, drying)?: A Lot Help from another person to put on and taking off regular upper body clothing?: A Little Help from another person to put on and taking off regular lower body clothing?: A Lot 6 Click Score: 16    End of Session Equipment Utilized During Treatment: Gait belt;Rolling walker      Activity Tolerance Patient tolerated  treatment well;Patient limited by pain(lower abdominal pain)   Patient Left in bed;with call bell/phone within reach;with bed alarm set   Nurse Communication Mobility status;Other (comment)(informed RN that pt with lower abdominal pain and requesting heat pack)        Time: 3762-8315 OT Time Calculation (min): 40 min  Charges: OT Treatments $Self Care/Home Management : 23-37 mins $Therapeutic Activity: 8-22 mins  Gerrianne Scale, MS, OTR/L ascom [phone] 04/10/19, 1:53 PM

## 2019-04-10 NOTE — Progress Notes (Addendum)
PROGRESS NOTE    Janet Mitchell  KPT:[id number] DOB: 1965/05/06 DOA: 04/03/2019  PCP: Lucianne Lei, MD    LOS - 6   Brief Narrative:  54 y.o. female with medical history of anemia due to ESRD on peritoneal dialysis, history of blood transfusion, CHF, chronic cholecystitis with calculus, ESRD on PD, history of headaches, history of hypertension, spinal headaches, type 2 diabetes mellitus who presented to the ED with nausea, vomiting and diarrhea x approximately one month.  Loose stools noted since her cholecystectomy.  Reported cough as well, sometimes with cough-induced emesis.  She was seen by GI outpatient, prescribed Levaquin, not started before this admission. Was taking Imodium at home without much effect.  In the ED, afebrile, hypoxic 88% on room air, COVID-19 antigen negative but PCR POSITIVE.  Hyponatremic 127.  Severe hypoalbuminemia 1.9, creatinine 9.53 consistent with ESRD. Inflammatory markers were elevated (ferritin was 1474, CRP 27.9, D-dimer 2.97, LDH 248, fibrinogen more than 800).  Procalcitonin 1.70.  Chest xray showed diffuse bilateral airspace disease.  She was treated with IV fluids, potassium, Rocephin and azithromycin.  Patient admitted to hospitalist service, started on remdesivir, steroids, bronchodilators.  Nephrology consulted for PD management.     Subjective 1/13: Patient states feeling terrible this AM.  Reports ongoing abdominal pain and asks for pain medicine, states oxycodone not helping.  No nausea/vomiting.  Continues to have diarrhea which is chronic since chole.  She is unsure if any improvement with cholestyramine yet.  No acute events reported.  Assessment & Plan:   Principal Problem:   ESRD on peritoneal dialysis [facility]) Active Problems:   Essential hypertension   Type 2 diabetes mellitus with ESRD (end-stage renal disease) (HCC)   Chronic diarrhea   Black stool   Uremia, acute   Weakness   Vertigo   Acute blood loss anemia   Hyperkalemia  Hypotension  End-stage renal disease on peritoneal dialysis Acute uremia -present on admission --Nephrology consulted --Continue peritoneal dialysis while admitted --IV fluids per nephrology  Lower abdominal pain Diarrhea -chronic since cholecystectomy.  No C. difficile testing indicated, per ID. CT abdomen pelvis on 1/11 showed abnormal thick walled small bowel loops in the left abdomen concerning for infectious or inflammatory enteritis, small amount of free fluid in peritoneal dialysis catheter in the left anterior lower abdomen. --Appreciate GI input --Continue cholestyramine and Bentyl --Nephrology sent peritoneal fluid to lab for culture, no growth in 2 days, no WBCs, no organisms --Stop Rocephin and Flagyl  Acute anemia superimposed on anemia of chronic disease -status post transfusion 1 unit packed red cells on 1/12 due to hemoglobin 7.0.  Stool guaiac positive and black in color on admission.  Hemoglobin 9.4 today 1/13. --Aspirin and heparin discontinued --Continue IV Protonix --Appreciate GI input  Hyperkalemia -resolved with dialysis --BMP daily to monitor  Generalized weakness -MRI brain was obtained on 1/5 and was negative for acute findings.   --PT evaluation, SNF versus home with home health PT --OT recommending home health and 24-hour supervision/assistance  Recent COVID-19 infection -status post treatment with remdesivir and steroids.  Type 2 diabetes -  --Lantus on hold due to patient not eating  Vertigo --Trial of meclizine   DVT prophylaxis: SCDs   Code Status: Full Code  Family Communication: None at bedside Disposition Plan: Expect medically stable for discharge in 1 to 2 days, SNF versus home with home health   Consultants:  Nephrology Gastroenterology   Procedures:   Peritoneal dialysis  Antimicrobials:   Rocephin and Flagyl -started  1/12, stop TBD   Objective: Vitals:   04/09/19 2008 04/09/19 2231 04/10/19 0500 04/10/19 0530  BP:  105/66 132/78  121/61  Pulse: 64 67  74  Resp: 16 16  16   Temp: (!) 97.4 F (36.3 C) (!) 97.3 F (36.3 C)  98.2 F (36.8 C)  TempSrc: Oral Oral  Oral  SpO2: 100% 98%  100%  Weight:   110.1 kg   Height:        Intake/Output Summary (Last 24 hours) at 04/10/2019 0809 Last data filed at 04/09/2019 2232 Gross per 24 hour  Intake 344 ml  Output --  Net 344 ml   Filed Weights   04/08/19 0700 04/09/19 0339 04/10/19 0500  Weight: 110 kg 115.1 kg 110.1 kg    Examination:  General exam: awake, alert, no acute distress, obese HEENT: moist mucus membranes, hearing grossly normal  Respiratory system: Decreased breath sounds but clear bilaterally, no wheezes, rales or rhonchi, normal respiratory effort. Cardiovascular system: normal S1/S2, RRR, no JVD, murmurs, rubs, gallops, no pedal edema.   Gastrointestinal system: soft, diffusely tender on light palpation, non-distended normal bowel sounds.  PD catheter present without surrounding warmth, induration and or erythema Central nervous system: alert and oriented x3. no gross focal neurologic deficits, normal speech Extremities: moves all, no edema, normal tone Skin: dry, intact, normal temperature,  Psychiatry: normal mood, congruent affect   Data Reviewed: I have personally reviewed following labs and imaging studies  CBC: Recent Labs  Lab 04/06/19 0601 04/07/19 0514 04/08/19 0611 04/09/19 1144 04/10/19 0502  WBC 9.6 10.4 9.0 8.2 9.2  HGB 10.3* 9.0* 8.7* 7.0* 9.4*  HCT 30.7* 26.7* 26.3* 21.5* 27.8*  MCV 66.9* 66.1* 67.8* 69.1* 68.3*  PLT 173 141* 144* 131* 097*   Basic Metabolic Panel: Recent Labs  Lab 04/04/19 0535 04/06/19 0601 04/07/19 0514 04/08/19 0611 04/09/19 1144 04/10/19 0502  NA 128* 131* 130* 130* 133* 130*  K 4.4 3.3* 3.6 3.8 6.6* 4.1  CL 87* 90* 90* 93* 106 94*  CO2 18* 19* 20* 21* 18* 22  GLUCOSE 207* 63* 176* 164* 59* 152*  BUN 147* 128* 117* 106* 82* 88*  CREATININE 16.24* 14.70* 14.00* 12.94*  10.00* 11.04*  CALCIUM 7.6* 7.1* 7.0* 6.9* 5.8* 7.2*  PHOS 12.3*  --  7.9*  --  4.7* 5.6*   GFR: Estimated Creatinine Clearance: 7.8 mL/min (A) (by C-G formula based on SCr of 11.04 mg/dL (H)). Liver Function Tests: Recent Labs  Lab 04/04/19 0535 04/07/19 0514 04/09/19 1144 04/10/19 0502  ALBUMIN 2.3* 1.7* 1.4* 1.6*   No results for input(s): LIPASE, AMYLASE in the last 168 hours. No results for input(s): AMMONIA in the last 168 hours. Coagulation Profile: No results for input(s): INR, PROTIME in the last 168 hours. Cardiac Enzymes: No results for input(s): CKTOTAL, CKMB, CKMBINDEX, TROPONINI in the last 168 hours. BNP (last 3 results) No results for input(s): PROBNP in the last 8760 hours. HbA1C: No results for input(s): HGBA1C in the last 72 hours. CBG: Recent Labs  Lab 04/09/19 1257 04/09/19 1339 04/09/19 1644 04/09/19 2004 04/10/19 0803  GLUCAP 58* 84 107* 124* 124*   Lipid Profile: No results for input(s): CHOL, HDL, LDLCALC, TRIG, CHOLHDL, LDLDIRECT in the last 72 hours. Thyroid Function Tests: No results for input(s): TSH, T4TOTAL, FREET4, T3FREE, THYROIDAB in the last 72 hours. Anemia Panel: No results for input(s): VITAMINB12, FOLATE, FERRITIN, TIBC, IRON, RETICCTPCT in the last 72 hours. Sepsis Labs: No results for input(s): PROCALCITON, LATICACIDVEN in the  last 168 hours.  Recent Results (from the past 240 hour(s))  Body fluid culture     Status: None (Preliminary result)   Collection Time: 04/06/19 11:55 AM   Specimen: Body Fluid  Result Value Ref Range Status   Specimen Description   Final    FLUID Performed at [facility], [place] [address] [place], [place] [address]    Special Requests   Final    PERITONEAL Performed at [facility], [place], [place] [address]    Gram Stain NO WBC SEEN NO ORGANISMS SEEN   Final   Culture   Final    NO GROWTH < 12 HOURS Performed at [facility], [place] [address],  [place], [place] [address]    Report Status PENDING  Incomplete         Radiology Studies: CT ABDOMEN PELVIS WO CONTRAST  Result Date: 04/08/2019 CLINICAL DATA:  Abdominal pain, nausea. COVID positive. Peritoneal dialysis. Heme-positive stools. EXAM: CT ABDOMEN AND PELVIS WITHOUT CONTRAST TECHNIQUE: Multidetector CT imaging of the abdomen and pelvis was performed following the standard protocol without IV contrast. COMPARISON:  03/25/2018 FINDINGS: Lower chest: Extensive peripheral airspace disease within the lung bases compatible with pneumonia. Borderline heart size. No effusions. Hepatobiliary: No focal liver abnormality is seen. Status post cholecystectomy. No biliary dilatation. Pancreas: No focal abnormality or ductal dilatation. Spleen: No focal abnormality.  Normal size. Adrenals/Urinary Tract: No adrenal abnormality. No focal renal abnormality. No stones or hydronephrosis. Urinary bladder is unremarkable. Stomach/Bowel: There is wall thickening within several small bowel loops best seen in the left abdomen compatible with enteritis. Stomach and large bowel grossly unremarkable. Vascular/Lymphatic: Calcified aorta. Heavily calcified mesenteric and renal vessels. No aneurysm or adenopathy. Reproductive: Uterus and adnexa unremarkable. No mass. IUD in the uterus. Other: Small amount of free fluid in the abdomen and pelvis. Peritoneal dialysis catheter noted anteriorly in the lower pelvis. Musculoskeletal: No acute bony abnormality. IMPRESSION: Abnormal thick walled small bowel loops in the left abdomen concerning for infectious or inflammatory enteritis. Small amount of free fluid in the abdomen and pelvis, likely related to peritoneal dialysis. Peritoneal dialysis catheter in the anterior lower pelvis. Heavily calcified aorta and branch vessels. Patchy peripheral airspace disease in the lower lobes compatible with pneumonia. Electronically Signed   By: Rolm Baptise M.D.   On: 04/08/2019 18:48         Scheduled Meds:  acetaminophen  650 mg Oral Once   calcium acetate  1,334 mg Oral TID WC   cholestyramine  4 g Oral BID   dicyclomine  20 mg Oral TID AC & HS   feeding supplement  1 Container Oral TID BM   feeding supplement (PRO-STAT SUGAR FREE 64)  30 mL Oral TID   gentamicin cream  1 application Topical Daily   insulin aspart  0-5 Units Subcutaneous QHS   insulin aspart  0-9 Units Subcutaneous TID WC   meclizine  12.5 mg Oral TID   multivitamin  1 tablet Oral QHS   pantoprazole (PROTONIX) IV  40 mg Intravenous Q12H   Continuous Infusions:  sodium chloride 50 mL/hr at 04/09/19 1521   cefTRIAXone (ROCEPHIN)  IV 2 g (04/09/19 1042)   dialysis solution 1.5% low-MG/low-CA     metronidazole 500 mg (04/09/19 2340)     LOS: 6 days    Time spent: 30-35     Ezekiel Slocumb, DO [facility]   If 7PM-7AM, please contact night-coverage [email or web address] Password [facility] 04/10/2019,  8:09 AM

## 2019-04-10 NOTE — Progress Notes (Signed)
PT Cancellation Note  Patient Details Name: Janet Mitchell MRN: [id number] DOB: 01/22/1966   Cancelled Treatment:    Reason Eval/Treat Not Completed: Other (comment).  Chart reviewed.  Pt resting in bed c/o 8/10 abdominal pain upon PT entry; pt also reporting already working with therapy today (pt had worked with OT earlier today).  Pt declining any physical therapy d/t 8/10 abdominal pain (nurse notified of pt's request for pain meds).  Will re-attempt PT treatment session at a later date/time.  Leitha Bleak, PT 04/10/19, 4:02 PM

## 2019-04-10 NOTE — Care Management Important Message (Signed)
Important Message  Patient Details  Name: Janet Mitchell MRN: [id number] Date of Birth: May 13, 1965   Medicare Important Message Given:  Yes  Reviewed verbally over room phone with patient due to isolation.  Copy of Medicare IM sent securely to patient's email address on file: [email or web address].     Dannette Barbara 04/10/2019, 10:48 AM

## 2019-04-10 NOTE — Progress Notes (Signed)
CCPD Tx initiated    04/10/19 2102  Peritoneal Catheter Right lower abdomen  No Placement Date or Time found.   Catheter Location: Right lower abdomen  Site Assessment Clean;Dry;Intact  Drainage Description None  Catheter status Deaccessed  Dressing Gauze/Drain sponge  Dressing Status Clean;Dry;Intact  Dressing Intervention Dressing changed  Cycler Setup  Total Number of Exchanges 4  Fill Volume 2000  Dianeal Solution Dextrose 1.5% in 6000 mL  Last Fill Volume 0  Fill Time - Minute(s) 10  Dwell Time - Hour(s) 2  Dwell Time - Minute(s) 43  Drain Time - Minute(s) 20 mins  Exit Site Care Performed Yes  Completion  Exit Site Care Performed Yes  Treatment Status Started  Hand-Off documentation  Report given to (Full Name) Beatris Ship, RN   Report received from (Full Name) Sula Rumple, RN

## 2019-04-10 NOTE — Progress Notes (Signed)
[facility]  ROUNDING NOTE   Subjective:   Peritoneal dialysis treatment last night. UF was negative. 1.5% dextrose.  There was some fibrin in the effluent. No more fibrin in effluent.   PRBC tranfusion yesterday.   NS at 48mL/hr  Worked with OT this today. She stood up and then got nausea.   Objective:  Vital signs in last 24 hours:  Temp:  [97.3 F (36.3 C)-98.2 F (36.8 C)] 97.7 F (36.5 C) (01/13 1158) Pulse Rate:  [62-74] 74 (01/13 1158) Resp:  [16-18] 16 (01/13 1158) BP: (96-132)/(58-78) 104/58 (01/13 1158) SpO2:  [91 %-100 %] 96 % (01/13 1158) Weight:  [110.1 kg] 110.1 kg (01/13 0500)  Weight change: -5 kg Filed Weights   04/08/19 0700 04/09/19 0339 04/10/19 0500  Weight: 110 kg 115.1 kg 110.1 kg    Intake/Output: I/O last 3 completed shifts: In: 1633.4 [I.V.:1333.4; Blood:300] Out: -    Intake/Output this shift:  No intake/output data recorded.  Physical Exam: General: NAD, laying in bed  Head: Normocephalic, atraumatic. Moist oral mucosal membranes  Eyes: Anicteric, PERRL  Neck: Supple, trachea midline  Lungs:  Clear to auscultation  Heart: Regular rate and rhythm  Abdomen:  Right and left lower quadrant tenderness   Extremities:  no peripheral edema.  Neurologic: Nonfocal, moving all four extremities  Skin: No lesions  Access: PD catheter    Basic Metabolic Panel: Recent Labs  Lab 04/04/19 0535 04/06/19 0601 04/07/19 0514 04/08/19 0611 04/09/19 1144 04/10/19 0502  NA 128* 131* 130* 130* 133* 130*  K 4.4 3.3* 3.6 3.8 6.6* 4.1  CL 87* 90* 90* 93* 106 94*  CO2 18* 19* 20* 21* 18* 22  GLUCOSE 207* 63* 176* 164* 59* 152*  BUN 147* 128* 117* 106* 82* 88*  CREATININE 16.24* 14.70* 14.00* 12.94* 10.00* 11.04*  CALCIUM 7.6* 7.1* 7.0* 6.9* 5.8* 7.2*  PHOS 12.3*  --  7.9*  --  4.7* 5.6*    Liver Function Tests: Recent Labs  Lab 04/04/19 0535 04/07/19 0514 04/09/19 1144 04/10/19 0502  ALBUMIN 2.3* 1.7* 1.4* 1.6*   No  results for input(s): LIPASE, AMYLASE in the last 168 hours. No results for input(s): AMMONIA in the last 168 hours.  CBC: Recent Labs  Lab 04/06/19 0601 04/07/19 0514 04/08/19 0611 04/09/19 1144 04/10/19 0502  WBC 9.6 10.4 9.0 8.2 9.2  HGB 10.3* 9.0* 8.7* 7.0* 9.4*  HCT 30.7* 26.7* 26.3* 21.5* 27.8*  MCV 66.9* 66.1* 67.8* 69.1* 68.3*  PLT 173 141* 144* 131* 141*    Cardiac Enzymes: No results for input(s): CKTOTAL, CKMB, CKMBINDEX, TROPONINI in the last 168 hours.  BNP: Invalid input(s): POCBNP  CBG: Recent Labs  Lab 04/09/19 1339 04/09/19 1644 04/09/19 2004 04/10/19 0803 04/10/19 1148  GLUCAP 84 107* 124* 124* 117*    Microbiology: Results for orders placed or performed during the hospital encounter of 04/03/19  Body fluid culture     Status: None (Preliminary result)   Collection Time: 04/06/19 11:55 AM   Specimen: Body Fluid  Result Value Ref Range Status   Specimen Description   Final    FLUID Performed at [facility], [place] [address] [place], [place] [address]    Special Requests   Final    PERITONEAL Performed at [facility], [place], [place] [address]    Gram Stain NO WBC SEEN NO ORGANISMS SEEN   Final   Culture   Final    NO GROWTH 2 DAYS Performed  at [facility], [place] [address] [place], [place] [address]    Report Status PENDING  Incomplete    Coagulation Studies: No results for input(s): LABPROT, INR in the last 72 hours.  Urinalysis: No results for input(s): COLORURINE, LABSPEC, PHURINE, GLUCOSEU, HGBUR, BILIRUBINUR, KETONESUR, PROTEINUR, UROBILINOGEN, NITRITE, LEUKOCYTESUR in the last 72 hours.  Invalid input(s): APPERANCEUR    Imaging: CT ABDOMEN PELVIS WO CONTRAST  Result Date: 04/08/2019 CLINICAL DATA:  Abdominal pain, nausea. COVID positive. Peritoneal dialysis. Heme-positive stools. EXAM: CT ABDOMEN AND PELVIS WITHOUT CONTRAST TECHNIQUE: Multidetector CT imaging of the abdomen and pelvis  was performed following the standard protocol without IV contrast. COMPARISON:  03/25/2018 FINDINGS: Lower chest: Extensive peripheral airspace disease within the lung bases compatible with pneumonia. Borderline heart size. No effusions. Hepatobiliary: No focal liver abnormality is seen. Status post cholecystectomy. No biliary dilatation. Pancreas: No focal abnormality or ductal dilatation. Spleen: No focal abnormality.  Normal size. Adrenals/Urinary Tract: No adrenal abnormality. No focal renal abnormality. No stones or hydronephrosis. Urinary bladder is unremarkable. Stomach/Bowel: There is wall thickening within several small bowel loops best seen in the left abdomen compatible with enteritis. Stomach and large bowel grossly unremarkable. Vascular/Lymphatic: Calcified aorta. Heavily calcified mesenteric and renal vessels. No aneurysm or adenopathy. Reproductive: Uterus and adnexa unremarkable. No mass. IUD in the uterus. Other: Small amount of free fluid in the abdomen and pelvis. Peritoneal dialysis catheter noted anteriorly in the lower pelvis. Musculoskeletal: No acute bony abnormality. IMPRESSION: Abnormal thick walled small bowel loops in the left abdomen concerning for infectious or inflammatory enteritis. Small amount of free fluid in the abdomen and pelvis, likely related to peritoneal dialysis. Peritoneal dialysis catheter in the anterior lower pelvis. Heavily calcified aorta and branch vessels. Patchy peripheral airspace disease in the lower lobes compatible with pneumonia. Electronically Signed   By: Rolm Baptise M.D.   On: 04/08/2019 18:48     Medications:   . sodium chloride 50 mL/hr at 04/09/19 1521  . sodium chloride    . cefTRIAXone (ROCEPHIN)  IV 2 g (04/10/19 1054)  . dialysis solution 1.5% low-MG/low-CA    . metronidazole 500 mg (04/10/19 0839)   . acetaminophen  650 mg Oral Once  . calcium acetate  1,334 mg Oral TID WC  . cholestyramine  4 g Oral BID  . dicyclomine  20 mg Oral  TID AC & HS  . feeding supplement  1 Container Oral TID BM  . feeding supplement (PRO-STAT SUGAR FREE 64)  30 mL Oral TID  . gentamicin cream  1 application Topical Daily  . insulin aspart  0-5 Units Subcutaneous QHS  . insulin aspart  0-9 Units Subcutaneous TID WC  . meclizine  12.5 mg Oral TID  . multivitamin  1 tablet Oral QHS  . pantoprazole (PROTONIX) IV  40 mg Intravenous Q12H   sodium chloride, acetaminophen **OR** acetaminophen, Gerhardt's butt cream, Ipratropium-Albuterol, ondansetron (ZOFRAN) IV, oxyCODONE  Assessment/ Plan:  54 y.o. female   Janet Mitchell is a 54 y.o. black female with end stage renal disease on peritoneal dialysis, hypertension, diabetes mellitus type II, congestive heart failure who presents to [facility] on 04/03/2019 for Hyponatremia [E87.1] Elevated BUN [R79.9] ESRD (end stage renal disease) ([place]) [N18.6] ESRD on peritoneal dialysis ([place]) [N18.6, Z99.2] COVID-19 [U07.1] Black stool [O27.7]   Complicated admission to [facility] for COVID-19 infection  [facility] 114kg Peritoneal Dialysis CCPD 12 hours 2866mL fills 4 exchanges last fill of 2566mL extraneal  1. End Stage  Renal Disease: on peritoneal dialysis. Negative ultrafiltration. Suspect hypovolemia.  - IV fluids: NS increase to 70mL/hr -Continue peritoneal dialysis tonight with home prescription, holding last fill with extraneal. Dextrose 1.5%.   2. Hypertension:  Home regimen of losartan, amlodipine, labetalol, doxazosin.  Currently holding all agents. Suspect patient is hypovolemic  3. Anemia of chronic kidney disease: PRBC transfusion on 1/12. EPO 20000 subcu units on 1/12.  - Appreciate GI input.   4. Secondary Hyperparathyroidism: with hyperphosphatemia  - calcium acetate   LOS: 6 Brette Cast 1/13/20212:08 PM

## 2019-04-10 NOTE — Progress Notes (Signed)
Pre CCPD Assessment    04/10/19 2102  Neurological  Level of Consciousness Alert  Orientation Level Oriented X4  Respiratory  Respiratory Pattern Regular;Unlabored  Chest Assessment Chest expansion symmetrical  Bilateral Breath Sounds Clear;Diminished  Cough None  Cardiac  Pulse Regular  Heart Sounds S1, S2  Vascular  R Radial Pulse +2  L Radial Pulse +2  Peritoneal Catheter Right lower abdomen  No Placement Date or Time found.   Catheter Location: Right lower abdomen  Site Assessment Clean;Dry;Intact  Drainage Description None  Catheter status Deaccessed  Dressing Gauze/Drain sponge  Dressing Status Clean;Dry;Intact  Dressing Intervention Dressing changed  Psychosocial  Psychosocial (WDL) WDL  Patient Behaviors Cooperative;Calm  Needs Expressed Physical;Emotional  Emotional support given Given to patient

## 2019-04-10 NOTE — Progress Notes (Signed)
Cephas Darby, MD [address]  [place]  [place], [place] [address]  Main: [phone]  Fax: [phone] Pager: [phone]   Subjective: Patient is refusing to eat because of ongoing abdominal pain and postprandial diarrhea.  She states every time she eats, she has severe burning in her upper abdomen that goes down to her lower abdomen and everything goes straight to her.  She said she did not eat breakfast today either.  She had 1 bowel movement today.  Objective:  Vital signs in last 24 hours: Vitals:   04/09/19 2231 04/10/19 0500 04/10/19 0530 04/10/19 1158  BP: 132/78  121/61 (!) 104/58  Pulse: 67  74 74  Resp: '16  16 16  ' Temp: (!) 97.3 F (36.3 C)  98.2 F (36.8 C) 97.7 F (36.5 C)  TempSrc: Oral  Oral Oral  SpO2: 98%  100% 96%  Weight:  110.1 kg    Height:       Weight change: -5 kg  Intake/Output Summary (Last 24 hours) at 04/10/2019 1344 Last data filed at 04/09/2019 2232 Gross per 24 hour  Intake 344 ml  Output --  Net 344 ml     Exam: Periorbital puffiness, appears pale Heart:: Regular rate and rhythm, S1S2 present or without murmur or extra heart sounds Lungs: normal and clear to auscultation Abdomen: soft, nontender, normal bowel sounds   Lab Results: CBC Latest Ref Rng & Units 04/10/2019 04/09/2019 04/08/2019  WBC 4.0 - 10.5 K/uL 9.2 8.2 9.0  Hemoglobin 12.0 - 15.0 g/dL 9.4(L) 7.0(L) 8.7(L)  Hematocrit 36.0 - 46.0 % 27.8(L) 21.5(L) 26.3(L)  Platelets 150 - 400 K/uL 141(L) 131(L) 144(L)   CMP Latest Ref Rng & Units 04/10/2019 04/09/2019 04/08/2019  Glucose 70 - 99 mg/dL 152(H) 59(L) 164(H)  BUN 6 - 20 mg/dL 88(H) 82(H) 106(H)  Creatinine 0.44 - 1.00 mg/dL 11.04(H) 10.00(H) 12.94(H)  Sodium 135 - 145 mmol/L 130(L) 133(L) 130(L)  Potassium 3.5 - 5.1 mmol/L 4.1 6.6(HH) 3.8  Chloride 98 - 111 mmol/L 94(L) 106 93(L)  CO2 22 - 32 mmol/L 22 18(L) 21(L)  Calcium 8.9 - 10.3 mg/dL 7.2(L) 5.8(LL) 6.9(L)  Total Protein 6.5 - 8.1 g/dL - - -   Total Bilirubin 0.3 - 1.2 mg/dL - - -  Alkaline Phos 38 - 126 U/L - - -  AST 15 - 41 U/L - - -  ALT 0 - 44 U/L - - -    Micro Results: Recent Results (from the past 240 hour(s))  Body fluid culture     Status: None (Preliminary result)   Collection Time: 04/06/19 11:55 AM   Specimen: Body Fluid  Result Value Ref Range Status   Specimen Description   Final    FLUID Performed at [facility], [place] [address] [place], [place] [address]    Special Requests   Final    PERITONEAL Performed at [facility], [place], [place] [address]    Gram Stain NO WBC SEEN NO ORGANISMS SEEN   Final   Culture   Final    NO GROWTH 2 DAYS Performed at [facility], [place] [address], [place], [place] [address]    Report Status PENDING  Incomplete   Studies/Results: CT ABDOMEN PELVIS WO CONTRAST  Result Date: 04/08/2019 CLINICAL DATA:  Abdominal pain, nausea. COVID positive. Peritoneal dialysis. Heme-positive stools. EXAM: CT ABDOMEN AND PELVIS WITHOUT CONTRAST TECHNIQUE: Multidetector CT imaging of the abdomen and pelvis was performed following the  standard protocol without IV contrast. COMPARISON:  03/25/2018 FINDINGS: Lower chest: Extensive peripheral airspace disease within the lung bases compatible with pneumonia. Borderline heart size. No effusions. Hepatobiliary: No focal liver abnormality is seen. Status post cholecystectomy. No biliary dilatation. Pancreas: No focal abnormality or ductal dilatation. Spleen: No focal abnormality.  Normal size. Adrenals/Urinary Tract: No adrenal abnormality. No focal renal abnormality. No stones or hydronephrosis. Urinary bladder is unremarkable. Stomach/Bowel: There is wall thickening within several small bowel loops best seen in the left abdomen compatible with enteritis. Stomach and large bowel grossly unremarkable. Vascular/Lymphatic: Calcified aorta. Heavily calcified mesenteric and renal vessels. No aneurysm or adenopathy.  Reproductive: Uterus and adnexa unremarkable. No mass. IUD in the uterus. Other: Small amount of free fluid in the abdomen and pelvis. Peritoneal dialysis catheter noted anteriorly in the lower pelvis. Musculoskeletal: No acute bony abnormality. IMPRESSION: Abnormal thick walled small bowel loops in the left abdomen concerning for infectious or inflammatory enteritis. Small amount of free fluid in the abdomen and pelvis, likely related to peritoneal dialysis. Peritoneal dialysis catheter in the anterior lower pelvis. Heavily calcified aorta and branch vessels. Patchy peripheral airspace disease in the lower lobes compatible with pneumonia. Electronically Signed   By: Rolm Baptise M.D.   On: 04/08/2019 18:48   Medications:  I have reviewed the patient's current medications. Prior to Admission:  Medications Prior to Admission  Medication Sig Dispense Refill Last Dose  . ALPRAZolam (XANAX) 0.5 MG tablet Take 0.5 mg by mouth at bedtime as needed for anxiety.   04/02/2019 at Unknown time  . amLODipine (NORVASC) 10 MG tablet Take 1 tablet (10 mg total) by mouth daily. 30 tablet 3 04/02/2019 at Unknown time  . ascorbic acid (VITAMIN C) 500 MG tablet Take 1 tablet (500 mg total) by mouth daily. 10 tablet 0 04/02/2019 at Unknown time  . aspirin EC 81 MG tablet Take 81 mg by mouth daily.   04/02/2019 at Unknown time  . calcitRIOL (ROCALTROL) 0.5 MCG capsule Take 0.5-1 mcg by mouth See admin instructions. 0.41mg on Mondays through Fridays and take 112m on Saturdays and Sundays   04/02/2019 at Unknown time  . Crisaborole (EUCRISA) 2 % OINT Apply 1 application topically daily as needed (for rash/irritation).   04/02/2019 at Unknown time  . doxazosin (CARDURA) 8 MG tablet Take 8 mg by mouth at bedtime.   04/02/2019 at Unknown time  . Hyoscyamine Sulfate SL (LEVSIN/SL) 0.125 MG SUBL Place 0.125 mg under the tongue 3 (three) times daily as needed (diarrhea). 90 tablet 3 04/02/2019 at Unknown time  . Insulin Glargine, 2 Unit Dial,  300 UNIT/ML SOPN Inject 20 Units into the skin at bedtime. 3 pen 2 04/02/2019 at 2000  . Ipratropium-Albuterol (COMBIVENT) 20-100 MCG/ACT AERS respimat Inhale 2 puffs into the lungs every 6 (six) hours as needed for wheezing or shortness of breath. 4 g 0 04/02/2019 at Unknown time  . labetalol (NORMODYNE) 200 MG tablet Take 200 mg by mouth 2 (two) times daily.   04/02/2019 at Unknown time  . losartan (COZAAR) 100 MG tablet Take 100 mg by mouth daily.   04/02/2019 at Unknown time  . zinc sulfate 220 (50 Zn) MG capsule Take 1 capsule (220 mg total) by mouth daily. 10 capsule 0 04/02/2019 at Unknown time  . Blood Glucose Monitoring Suppl (ACCU-CHEK GUIDE) w/Device KIT 1 Piece by Does not apply route as directed. (Patient not taking: Reported on 03/26/2019) 1 kit 0   . glucose blood (ACCU-CHEK GUIDE) test strip Use  as instructed 150 each 2    Scheduled: . acetaminophen  650 mg Oral Once  . calcium acetate  1,334 mg Oral TID WC  . cholestyramine  4 g Oral BID  . dicyclomine  20 mg Oral TID AC & HS  . feeding supplement  1 Container Oral TID BM  . feeding supplement (PRO-STAT SUGAR FREE 64)  30 mL Oral TID  . gentamicin cream  1 application Topical Daily  . insulin aspart  0-5 Units Subcutaneous QHS  . insulin aspart  0-9 Units Subcutaneous TID WC  . meclizine  12.5 mg Oral TID  . multivitamin  1 tablet Oral QHS  . pantoprazole (PROTONIX) IV  40 mg Intravenous Q12H   Continuous: . sodium chloride 50 mL/hr at 04/09/19 1521  . sodium chloride    . cefTRIAXone (ROCEPHIN)  IV 2 g (04/10/19 1054)  . dialysis solution 1.5% low-MG/low-CA    . metronidazole 500 mg (04/10/19 0839)   AST:MHDQQI chloride, acetaminophen **OR** acetaminophen, Gerhardt's butt cream, Ipratropium-Albuterol, ondansetron (ZOFRAN) IV, oxyCODONE Anti-infectives (From admission, onward)   Start     Dose/Rate Route Frequency Ordered Stop   04/09/19 1000  cefTRIAXone (ROCEPHIN) 2 g in sodium chloride 0.9 % 100 mL IVPB     2 g 200 mL/hr  over 30 Minutes Intravenous Every 24 hours 04/09/19 0741     04/09/19 0800  metroNIDAZOLE (FLAGYL) IVPB 500 mg     500 mg 100 mL/hr over 60 Minutes Intravenous Every 8 hours 04/09/19 0711     04/09/19 0730  ciprofloxacin (CIPRO) IVPB 400 mg  Status:  Discontinued     400 mg 200 mL/hr over 60 Minutes Intravenous Every 24 hours 04/09/19 0727 04/09/19 0741     Scheduled Meds: . acetaminophen  650 mg Oral Once  . calcium acetate  1,334 mg Oral TID WC  . cholestyramine  4 g Oral BID  . dicyclomine  20 mg Oral TID AC & HS  . feeding supplement  1 Container Oral TID BM  . feeding supplement (PRO-STAT SUGAR FREE 64)  30 mL Oral TID  . gentamicin cream  1 application Topical Daily  . insulin aspart  0-5 Units Subcutaneous QHS  . insulin aspart  0-9 Units Subcutaneous TID WC  . meclizine  12.5 mg Oral TID  . multivitamin  1 tablet Oral QHS  . pantoprazole (PROTONIX) IV  40 mg Intravenous Q12H   Continuous Infusions: . sodium chloride 50 mL/hr at 04/09/19 1521  . sodium chloride    . cefTRIAXone (ROCEPHIN)  IV 2 g (04/10/19 1054)  . dialysis solution 1.5% low-MG/low-CA    . metronidazole 500 mg (04/10/19 0839)   PRN Meds:.sodium chloride, acetaminophen **OR** acetaminophen, Gerhardt's butt cream, Ipratropium-Albuterol, ondansetron (ZOFRAN) IV, oxyCODONE   Assessment: Principal Problem:   ESRD on peritoneal dialysis [facility]) Active Problems:   Essential hypertension   Type 2 diabetes mellitus with ESRD (end-stage renal disease) (HCC)   Chronic diarrhea   Black stool   Uremia, acute   Weakness   Vertigo   Acute blood loss anemia   Hyperkalemia   Hypotension   Plan: Chronic nonbloody diarrhea, generalized abdominal pain Most likely bile salt associated diarrhea.  Patient has severe hypoalbuminemia, resulting in bowel wall edema and third space loss likely explanation for thickened small bowel loops seen on the CT scan.  She does not have leukocytosis.  And the diarrhea has been  chronic, less likely infectious etiology Colonoscopy with biopsies in 2020 did not reveal microscopic  colitis or inflammatory bowel disease Recommend to discontinue antibiotics from GI standpoint Recommend to continue cholestyramine or colestipol 4 g 2-3 times a day, reiterated to the patient and she is willing to try Continue Bentyl 20 mg before each meal and at bedtime She should follow-up with GI as outpatient  Upper abdominal burning pain Continue Protonix 40 mg IV twice daily H. pylori IgG in process Celiac panel in process  Severe hypoalbuminemia: Patient has history of nephrotic syndrome Discussed with Dr. Juleen China Likely combination of underlying nephrotic syndrome plus peritoneal dialysis plus malnutrition Chronic diarrhea is bile salt induced postcholecystectomy, not secondary to malabsorption No evidence of liver disease  GI will follow along with you   LOS: 6 days   Mike Berntsen 04/10/2019, 1:44 PM

## 2019-04-11 LAB — RENAL FUNCTION PANEL
Albumin: 1.5 g/dL — ABNORMAL LOW (ref 3.5–5.0)
Anion gap: 15 (ref 5–15)
BUN: 81 mg/dL — ABNORMAL HIGH (ref 6–20)
CO2: 20 mmol/L — ABNORMAL LOW (ref 22–32)
Calcium: 7.5 mg/dL — ABNORMAL LOW (ref 8.9–10.3)
Chloride: 96 mmol/L — ABNORMAL LOW (ref 98–111)
Creatinine, Ser: 10.71 mg/dL — ABNORMAL HIGH (ref 0.44–1.00)
GFR calc Af Amer: 4 mL/min — ABNORMAL LOW (ref >60)
GFR calc non Af Amer: 4 mL/min — ABNORMAL LOW (ref >60)
Glucose, Bld: 174 mg/dL — ABNORMAL HIGH (ref 70–99)
Phosphorus: 5.4 mg/dL — ABNORMAL HIGH (ref 2.5–4.6)
Potassium: 3.9 mmol/L (ref 3.5–5.1)
Sodium: 131 mmol/L — ABNORMAL LOW (ref 135–145)

## 2019-04-11 LAB — GLUCOSE, CAPILLARY
Glucose-Capillary: 209 mg/dL — ABNORMAL HIGH (ref 70–99)
Glucose-Capillary: 78 mg/dL (ref 70–99)
Glucose-Capillary: 149 mg/dL — ABNORMAL HIGH (ref 70–99)
Glucose-Capillary: 175 mg/dL — ABNORMAL HIGH (ref 70–99)
Glucose-Capillary: 183 mg/dL — ABNORMAL HIGH (ref 70–99)
Glucose-Capillary: 165 mg/dL — ABNORMAL HIGH (ref 70–99)

## 2019-04-11 LAB — CBC
HCT: 27.7 % — ABNORMAL LOW (ref 36.0–46.0)
Hemoglobin: 9.1 g/dL — ABNORMAL LOW (ref 12.0–15.0)
MCH: 22.8 pg — ABNORMAL LOW (ref 26.0–34.0)
MCHC: 32.9 g/dL (ref 30.0–36.0)
MCV: 69.3 fL — ABNORMAL LOW (ref 80.0–100.0)
Platelets: 147 10*3/uL — ABNORMAL LOW (ref 150–400)
RBC: 4 MIL/uL (ref 3.87–5.11)
RDW: 19.4 % — ABNORMAL HIGH (ref 11.5–15.5)
WBC: 15.9 10*3/uL — ABNORMAL HIGH (ref 4.0–10.5)
nRBC: 0 % (ref 0.0–0.2)

## 2019-04-11 LAB — H PYLORI, IGM, IGG, IGA AB
H Pylori IgG: 0.16 Index Value (ref 0.00–0.79)
H. Pylogi, Iga Abs: <9 units (ref 0.0–8.9)
H. Pylogi, Igm Abs: <9 units (ref 0.0–8.9)

## 2019-04-11 LAB — CELIAC DISEASE PANEL
Endomysial Ab, IgA: NEGATIVE
IgA: 198 mg/dL (ref 87–352)
Tissue Transglutaminase Ab, IgA: <2 U/mL (ref 0–3)

## 2019-04-11 MED ORDER — CHOLESTYRAMINE 4 G PO PACK: 4.0000 g | PACK | Freq: Two times a day (BID) | ORAL | 1 refills | Status: DC

## 2019-04-11 MED ORDER — OXYCODONE HCL 10 MG PO TABS: 10.0000 mg | ORAL_TABLET | ORAL | 0 refills | Status: DC | PRN

## 2019-04-11 MED ORDER — RENA-VITE PO TABS: 1.0000 | ORAL_TABLET | Freq: Every day | ORAL | 0 refills | Status: DC

## 2019-04-11 MED ORDER — HYDROMORPHONE HCL 1 MG/ML IJ SOLN
0.5000 mg | Freq: Once | INTRAMUSCULAR | Status: AC
  Administered 2019-04-11: 0.5 mg via INTRAVENOUS
  Filled 2019-04-11: qty 0.5

## 2019-04-11 MED ORDER — PANTOPRAZOLE SODIUM 40 MG PO TBEC: 40.0000 mg | DELAYED_RELEASE_TABLET | Freq: Every day | ORAL | 0 refills | Status: DC

## 2019-04-11 MED ORDER — DICYCLOMINE HCL 20 MG PO TABS: 20.0000 mg | ORAL_TABLET | Freq: Three times a day (TID) | ORAL | 1 refills | Status: DC

## 2019-04-11 MED ORDER — CALCIUM ACETATE (PHOS BINDER) 667 MG PO CAPS: 1334.0000 mg | ORAL_CAPSULE | Freq: Three times a day (TID) | ORAL | 0 refills | Status: DC

## 2019-04-11 MED ORDER — PRO-STAT SUGAR FREE PO LIQD: 30.0000 mL | Freq: Three times a day (TID) | ORAL | 0 refills | Status: DC

## 2019-04-11 NOTE — Discharge Summary (Signed)
Physician Discharge Summary  Janet Mitchell:[id number] DOB: [birth year] DOA: 04/03/2019  PCP: Lucianne Lei, MD  Admit date: 04/03/2019 Discharge date: 04/13/2019  Admitted From: Home Disposition:  Home  Recommendations for Outpatient Follow-up:  1. Follow up with PCP in 1-2 weeks 2. Please obtain BMP/CBC in one week 3. Please follow up with nephrology as scheduled 4. Please follow up with gastroenterology  Home Health: Yes Equipment/Devices: Oxygen, wheelchair, PT, OT   Discharge Condition: Stable  CODE STATUS: Full Diet recommendation: Renal   Brief/Interim Summary:  54 y.o.femalewith medical history ofanemia due to [facility] peritoneal dialysis, history of blood transfusion, CHF, chronic cholecystitis s/p cholecystectomy, ESRD on PD, history of headaches, history of hypertension, spinal headaches, type 2 diabetes mellituswho presented to the ED withnausea, vomiting and diarrheax approximately onemonth. Loose stools noted since her cholecystectomy. She was seen by GI outpatient, prescribed Levaquin, not started before this admission. Was taking Imodium at home without much effect. In the ED, afebrile, hypoxic 88% on room air, COVID-19 antigen negative but PCR POSITIVE. Hyponatremic 127. Severe hypoalbuminemia 1.9, creatinine 9.53 consistent with ESRD. Inflammatory markers were elevated.  Procalcitonin 1.70. Chest xray showed diffuse bilateral airspace disease. She was treated with IV fluids, potassium, Rocephin and azithromycin. Patient admitted to hospitalist service, started on remdesivir, steroids, bronchodilators. Nephrology consulted for PD management.GI consulted for persistent abdominal pain and diarrhea. Patient has chronic diarrhea likely bile salt associated s/p cholecystectomy. Started on cholestyramine, Levsin, rifaximin, trial of amitriptyline at bedtime, Protonix.  H pylori and celiac panels negative.  CT abdomen and pelvis without contrast during this  admission revealed heavily calcified aorta and branch vessels.  Consider mesenteric ischemia as source of patient's most often post-prandial abdominal pain.  Discussed with renal and radiology and planned for CTA abdomen/pelvis to complete evaluation.  Patient's abdominal pain had improved, tolerating some oral intake.  She requested discharge home and close outpatient follow up and CTA. Patient qualified for oxygen on discharge, this was arranged in addition to home health PT and OT.     Discharge Diagnoses: Principal Problem:   ESRD on peritoneal dialysis [facility]) Active Problems:   Essential hypertension   Type 2 diabetes mellitus with ESRD (end-stage renal disease) (HCC)   Chronic diarrhea   Black stool   Uremia, acute   Weakness   Vertigo   Acute blood loss anemia   Hyperkalemia   Hypotension    Discharge Instructions   Discharge Instructions    MyChart COVID-19 home monitoring program   Complete by: Apr 12, 2019    Is the patient willing to use the [employer] for home monitoring?: Yes   Call MD for:  extreme fatigue   Complete by: As directed    Call MD for:  persistant nausea and vomiting   Complete by: As directed    Call MD for:  persistant nausea and vomiting   Complete by: As directed    Call MD for:  severe uncontrolled pain   Complete by: As directed    Call MD for:  severe uncontrolled pain   Complete by: As directed    Call MD for:  temperature >100.4   Complete by: As directed    Call MD for:  temperature >100.4   Complete by: As directed    Diet - low sodium heart healthy   Complete by: As directed    Discharge instructions   Complete by: As directed    Follow up closely with GI for further work-up of your abdominal  pain. We want to get CT angiogram of your abdomen to see if any artery blockages or narrowing that would cause this abdominal pain.  Please do your best to stay well-hydrated and eat as much as possible.  If unable to tolerate eating  solid foods due to the pain, please drink at least 3-4 boost shakes daily.    Take potassium daily for next 3 days, and get labs re-checked at follow up next week.   Increase activity slowly   Complete by: As directed    Increase activity slowly   Complete by: As directed      Allergies as of 04/13/2019   No Known Allergies     Medication List    STOP taking these medications   doxazosin 8 MG tablet Commonly known as: CARDURA   labetalol 200 MG tablet Commonly known as: NORMODYNE   losartan 100 MG tablet Commonly known as: COZAAR     TAKE these medications   Accu-Chek Guide test strip Generic drug: glucose blood Use as instructed   Accu-Chek Guide w/Device Kit 1 Piece by Does not apply route as directed.   ALPRAZolam 0.5 MG tablet Commonly known as: XANAX Take 0.5 mg by mouth at bedtime as needed for anxiety.   amitriptyline 25 MG tablet Commonly known as: ELAVIL Take 1 tablet (25 mg total) by mouth at bedtime.   amLODipine 10 MG tablet Commonly known as: NORVASC Take 1 tablet (10 mg total) by mouth daily.   ascorbic acid 500 MG tablet Commonly known as: VITAMIN C Take 1 tablet (500 mg total) by mouth daily.   aspirin EC 81 MG tablet Take 81 mg by mouth daily.   calcitRIOL 0.5 MCG capsule Commonly known as: ROCALTROL Take 0.5-1 mcg by mouth See admin instructions. 0.61mg on Mondays through Fridays and take 144m on Saturdays and Sundays   calcium acetate 667 MG capsule Commonly known as: PHOSLO Take 2 capsules (1,334 mg total) by mouth 3 (three) times daily with meals.   cholestyramine 4 g packet Commonly known as: QUESTRAN Take 1 packet (4 g total) by mouth 2 (two) times daily.   Eucrisa 2 % Oint Generic drug: Crisaborole Apply 1 application topically daily as needed (for rash/irritation).   feeding supplement (PRO-STAT SUGAR FREE 64) Liqd Take 30 mLs by mouth 3 (three) times daily.   Hyoscyamine Sulfate SL 0.125 MG Subl Commonly known as:  Levsin/SL Place 0.125 mg under the tongue 3 (three) times daily as needed (diarrhea). What changed: Another medication with the same name was added. Make sure you understand how and when to take each.   hyoscyamine 0.375 MG 12 hr tablet Commonly known as: LEVBID Take 1 tablet (0.375 mg total) by mouth every 12 (twelve) hours. What changed: You were already taking a medication with the same name, and this prescription was added. Make sure you understand how and when to take each.   Insulin Glargine (2 Unit Dial) 300 UNIT/ML Sopn Inject 20 Units into the skin at bedtime.   Ipratropium-Albuterol 20-100 MCG/ACT Aers respimat Commonly known as: COMBIVENT Inhale 2 puffs into the lungs every 6 (six) hours as needed for wheezing or shortness of breath.   multivitamin Tabs tablet Take 1 tablet by mouth at bedtime.   pantoprazole 40 MG tablet Commonly known as: Protonix Take 1 tablet (40 mg total) by mouth daily.   potassium chloride SA 20 MEQ tablet Commonly known as: KLOR-CON Take 2 tablets (40 mEq total) by mouth daily for 3 days.  rifaximin 550 MG Tabs tablet Commonly known as: XIFAXAN Take 1 tablet (550 mg total) by mouth 2 (two) times daily.   simethicone 80 MG chewable tablet Commonly known as: MYLICON Chew 2 tablets (160 mg total) by mouth 4 (four) times daily as needed (gassy abdominal pain).   zinc sulfate 220 (50 Zn) MG capsule Take 1 capsule (220 mg total) by mouth daily.            Durable Medical Equipment  (From admission, onward)         Start     Ordered   04/13/19 1144  For home use only DME oxygen  Once    Question Answer Comment  Length of Need 6 Months   Mode or (Route) Nasal cannula   Liters per Minute 2   Frequency Only at night (stationary unit needed)   Oxygen delivery system Gas      04/13/19 1144   04/11/19 1241  For home use only DME lightweight manual wheelchair with seat cushion  Once    Comments: Patient suffers from generalized  weakness which impairs their ability to perform daily activities like walking in the home.  A walker will not resolve  issue with performing activities of daily living. A wheelchair will allow patient to safely perform daily activities. Patient is not able to propel themselves in the home using a standard weight wheelchair due to weakness. Patient can self propel in the lightweight wheelchair. Length of need lifetime Accessories: elevating leg rests (ELRs), wheel locks, extensions and anti-tippers.back cushion   04/11/19 1242   04/05/19 1250  For home use only DME Bedside commode  Once    Question:  Patient needs a bedside commode to treat with the following condition  Answer:  Weakness   04/05/19 1250   04/05/19 1250  For home use only DME Walker rolling  Once    Question Answer Comment  Walker: With Kulm   Patient needs a walker to treat with the following condition Weakness      04/05/19 1250         Follow-up Information    Lucianne Lei, MD. Go on 04/16/2019.   Specialty: Family Medicine Why: 3-5 days after discharge 11:15am appointment plz call tommorrow if this wont work Sport and exercise psychologist information: [address] [place] [address] [phone]        Lavonia Dana, MD. Go on 04/18/2019.   Specialty: Nephrology Why: this appointment will be in the [place] office  12:20 appointment Contact information: [place] [address] [phone]          No Known Allergies  Consultations:  Nephrology  Gastroenterology   Procedures/Studies: CT ABDOMEN PELVIS WO CONTRAST  Result Date: 04/08/2019 CLINICAL DATA:  Abdominal pain, nausea. COVID positive. Peritoneal dialysis. Heme-positive stools. EXAM: CT ABDOMEN AND PELVIS WITHOUT CONTRAST TECHNIQUE: Multidetector CT imaging of the abdomen and pelvis was performed following the standard protocol without IV contrast. COMPARISON:  03/25/2018 FINDINGS: Lower chest: Extensive  peripheral airspace disease within the lung bases compatible with pneumonia. Borderline heart size. No effusions. Hepatobiliary: No focal liver abnormality is seen. Status post cholecystectomy. No biliary dilatation. Pancreas: No focal abnormality or ductal dilatation. Spleen: No focal abnormality.  Normal size. Adrenals/Urinary Tract: No adrenal abnormality. No focal renal abnormality. No stones or hydronephrosis. Urinary bladder is unremarkable. Stomach/Bowel: There is wall thickening within several small bowel loops best seen in the left abdomen compatible with enteritis. Stomach and large  bowel grossly unremarkable. Vascular/Lymphatic: Calcified aorta. Heavily calcified mesenteric and renal vessels. No aneurysm or adenopathy. Reproductive: Uterus and adnexa unremarkable. No mass. IUD in the uterus. Other: Small amount of free fluid in the abdomen and pelvis. Peritoneal dialysis catheter noted anteriorly in the lower pelvis. Musculoskeletal: No acute bony abnormality. IMPRESSION: Abnormal thick walled small bowel loops in the left abdomen concerning for infectious or inflammatory enteritis. Small amount of free fluid in the abdomen and pelvis, likely related to peritoneal dialysis. Peritoneal dialysis catheter in the anterior lower pelvis. Heavily calcified aorta and branch vessels. Patchy peripheral airspace disease in the lower lobes compatible with pneumonia. Electronically Signed   By: Rolm Baptise M.D.   On: 04/08/2019 18:48   CT Head Wo Contrast  Result Date: 04/01/2019 CLINICAL DATA:  Neuro deficit(s), subacute recent hospitalization and discharge for COVID. Persistent nausea, vomiting, and weakness. EXAM: CT HEAD WITHOUT CONTRAST TECHNIQUE: Contiguous axial images were obtained from the base of the skull through the vertex without intravenous contrast. COMPARISON:  Brain MRI 01/11/2019 FINDINGS: Brain: No intracranial hemorrhage, mass effect, or midline shift. No hydrocephalus. The basilar cisterns are  patent. The small remote left occipital infarct on MRI is not well seen by CT. No evidence of territorial infarct or acute ischemia. No extra-axial or intracranial fluid collection. Vascular: Atherosclerosis of skullbase vasculature without hyperdense vessel or abnormal calcification. Skull: No fracture or focal lesion. Sinuses/Orbits: Paranasal sinuses and mastoid air cells are clear. The visualized orbits are unremarkable. Other: None. IMPRESSION: No acute intracranial abnormality. Electronically Signed   By: Keith Rake M.D.   On: 04/01/2019 04:33   MR BRAIN WO CONTRAST  Result Date: 04/02/2019 CLINICAL DATA:  Slurred speech, weakness, headache EXAM: MRI HEAD WITHOUT CONTRAST TECHNIQUE: Multiplanar, multiecho pulse sequences of the brain and surrounding structures were obtained without intravenous contrast. COMPARISON:  01/11/2019 FINDINGS: Motion artifact is present. Sagittal T1, axial and coronal DWI, and axial T2 sequences were obtained. Patient could not tolerate remainder of study. Findings below are within this limitation Brain: There is no acute infarction. There is no intracranial mass, mass effect, or edema. There is no hydrocephalus or extra-axial fluid collection. Vascular: Major vessel flow voids at the skull base are preserved. Skull and upper cervical spine: Normal marrow signal is preserved. Sinuses/Orbits: Mild mucosal thickening. Orbits are grossly unremarkable. Other: Sella is unremarkable.  Mastoid air cells are clear. IMPRESSION: Partial, motion degraded study.  No acute infarction. Electronically Signed   By: Macy Mis M.D.   On: 04/02/2019 18:21   DG Chest Portable 1 View  Result Date: 04/02/2019 CLINICAL DATA:  Weakness. EXAM: PORTABLE CHEST 1 VIEW COMPARISON:  Jul 31, 2018 FINDINGS: Low lung volumes are seen which is likely, in part, secondary to suboptimal patient inspiration. There is subsequent vascular crowding. Very mild hazy diffuse bilateral infiltrates are seen.  There is no evidence of a pleural effusion or pneumothorax. The heart size and mediastinal contours are within normal limits. Multilevel degenerative changes seen throughout the thoracic spine. IMPRESSION: 1. Low lung volumes with very mild hazy diffuse bilateral infiltrates. Electronically Signed   By: Virgina Norfolk M.D.   On: 04/02/2019 18:18   Acute Abd Series  Result Date: 03/25/2019 CLINICAL DATA:  Vomiting and diarrhea EXAM: DG ABDOMEN ACUTE W/ 1V CHEST COMPARISON:  03/24/2018 FINDINGS: Extensive diffuse bilateral airspace disease, with progression from the prior study. Upper and lower lobe airspace disease bilaterally. No effusion. Heart size upper normal. Normal bowel gas pattern.  No obstruction or ileus.  No free air. Surgical clips in the gallbladder fossa. Fracture fixation of left acetabular fracture with metal plates and screws. IMPRESSION: Diffuse bilateral airspace disease, with progression. There appears to be an element of chronic lung disease with progression. Possible superimposed pneumonia. Normal bowel gas pattern. Electronically Signed   By: Franchot Gallo M.D.   On: 03/25/2019 21:12      Routine Peritoneal Dialysis   Subjective: Patient states feeling well this AM.  Abdominal pain improved.  Tolerating more oral intake.  No fever/chills.  No acute events.   Discharge Exam: Vitals:   04/13/19 0600 04/13/19 1209  BP: (!) 122/59 119/68  Pulse: 86 89  Resp: 20 18  Temp: 98.4 F (36.9 C) 98.7 F (37.1 C)  SpO2: 96% 98%   Vitals:   04/12/19 1456 04/12/19 2129 04/13/19 0600 04/13/19 1209  BP: (!) 116/56 (!) 110/51 (!) 122/59 119/68  Pulse: 84 86 86 89  Resp:  '20 20 18  ' Temp: 98.4 F (36.9 C) 100 F (37.8 C) 98.4 F (36.9 C) 98.7 F (37.1 C)  TempSrc: Oral Oral Oral Oral  SpO2: 94% 94% 96% 98%  Weight:      Height:        General: Pt is alert, awake, not in acute distress Cardiovascular: RRR, S1/S2 +, no rubs, no gallops Respiratory: CTA bilaterally,  no wheezing, no rhonchi Abdominal: Soft, NT, ND, bowel sounds +, PD catheter present Extremities: no edema, no cyanosis    The results of significant diagnostics from this hospitalization (including imaging, microbiology, ancillary and laboratory) are listed below for reference.     Microbiology: Recent Results (from the past 240 hour(s))  Body fluid culture     Status: None   Collection Time: 04/06/19 11:55 AM   Specimen: Body Fluid  Result Value Ref Range Status   Specimen Description   Final    FLUID Performed at [facility], [place] [address], [place], [place] [address]    Special Requests   Final    PERITONEAL Performed at [facility], [place], [place] [address]    Gram Stain NO WBC SEEN NO ORGANISMS SEEN   Final   Culture   Final    NO GROWTH 3 DAYS Performed at [facility], [place] [address] [place], [place] [address]    Report Status 04/12/2019 FINAL  Final  GI pathogen panel by PCR, stool     Status: None   Collection Time: 04/09/19  2:31 PM   Specimen: Stool  Result Value Ref Range Status   Plesiomonas shigelloides NOT DETECTED NOT DETECTED Final   Yersinia enterocolitica NOT DETECTED NOT DETECTED Final   Vibrio NOT DETECTED NOT DETECTED Final   Enteropathogenic E coli NOT DETECTED NOT DETECTED Final   E coli (ETEC) LT/ST NOT DETECTED NOT DETECTED Final   E coli 2130 by PCR Not applicable NOT DETECTED Final   Cryptosporidium by PCR NOT DETECTED NOT DETECTED Final   Entamoeba histolytica NOT DETECTED NOT DETECTED Final   Adenovirus F 40/41 NOT DETECTED NOT DETECTED Final   Norovirus GI/GII NOT DETECTED NOT DETECTED Final   Sapovirus NOT DETECTED NOT DETECTED Final    Comment: (NOTE) Performed At: [facility] [place], [place] [id number] Rush Farmer MD EX:[id number]    Vibrio cholerae NOT DETECTED NOT DETECTED Final   Campylobacter by PCR NOT DETECTED NOT DETECTED Final   Salmonella by PCR NOT  DETECTED NOT DETECTED Final   E coli (  STEC) NOT DETECTED NOT DETECTED Final   Enteroaggregative E coli NOT DETECTED NOT DETECTED Final   Shigella by PCR NOT DETECTED NOT DETECTED Final   Cyclospora cayetanensis NOT DETECTED NOT DETECTED Final   Astrovirus NOT DETECTED NOT DETECTED Final   G lamblia by PCR NOT DETECTED NOT DETECTED Final   Rotavirus A by PCR NOT DETECTED NOT DETECTED Final     Labs: BNP (last 3 results) No results for input(s): BNP in the last 8760 hours. Basic Metabolic Panel: Recent Labs  Lab 04/07/19 0514 04/07/19 0514 04/08/19 0611 04/09/19 1144 04/10/19 0502 04/11/19 0444 04/13/19 0611  NA 130*   < > 130* 133* 130* 131* 127*  K 3.6   < > 3.8 6.6* 4.1 3.9 3.3*  CL 90*   < > 93* 106 94* 96* 92*  CO2 20*   < > 21* 18* 22 20* 22  GLUCOSE 176*   < > 164* 59* 152* 174* 186*  BUN 117*   < > 106* 82* 88* 81* 70*  CREATININE 14.00*   < > 12.94* 10.00* 11.04* 10.71* 10.00*  CALCIUM 7.0*   < > 6.9* 5.8* 7.2* 7.5* 7.6*  PHOS 7.9*  --   --  4.7* 5.6* 5.4*  --    < > = values in this interval not displayed.   Liver Function Tests: Recent Labs  Lab 04/07/19 0514 04/09/19 1144 04/10/19 0502 04/11/19 0444 04/13/19 0611  AST  --   --   --   --  7*  ALT  --   --   --   --  6  ALKPHOS  --   --   --   --  66  BILITOT  --   --   --   --  0.6  PROT  --   --   --   --  5.0*  ALBUMIN 1.7* 1.4* 1.6* 1.5* 1.6*   No results for input(s): LIPASE, AMYLASE in the last 168 hours. No results for input(s): AMMONIA in the last 168 hours. CBC: Recent Labs  Lab 04/09/19 1144 04/10/19 0502 04/11/19 0444 04/12/19 1000 04/13/19 0611  WBC 8.2 9.2 15.9* 18.5* 15.8*  NEUTROABS  --   --   --  16.0* 13.2*  HGB 7.0* 9.4* 9.1* 9.0* 8.4*  HCT 21.5* 27.8* 27.7* 27.1* 25.5*  MCV 69.1* 68.3* 69.3* 69.3* 69.3*  PLT 131* 141* 147* 176 174   Cardiac Enzymes: No results for input(s): CKTOTAL, CKMB, CKMBINDEX, TROPONINI in the last 168 hours. BNP: Invalid input(s):  POCBNP CBG: Recent Labs  Lab 04/12/19 1158 04/12/19 1617 04/12/19 2208 04/13/19 0733 04/13/19 1208  GLUCAP 215* 203* 170* 174* 197*   D-Dimer No results for input(s): DDIMER in the last 72 hours. Hgb A1c No results for input(s): HGBA1C in the last 72 hours. Lipid Profile No results for input(s): CHOL, HDL, LDLCALC, TRIG, CHOLHDL, LDLDIRECT in the last 72 hours. Thyroid function studies No results for input(s): TSH, T4TOTAL, T3FREE, THYROIDAB in the last 72 hours.  Invalid input(s): FREET3 Anemia work up No results for input(s): VITAMINB12, FOLATE, FERRITIN, TIBC, IRON, RETICCTPCT in the last 72 hours. Urinalysis    Component Value Date/Time   COLORURINE AMBER (A) 07/31/2018 1800   APPEARANCEUR TURBID (A) 07/31/2018 1800   LABSPEC >1.046 (H) 07/31/2018 1800   PHURINE 5.0 07/31/2018 1800   GLUCOSEU 150 (A) 07/31/2018 1800   HGBUR SMALL (A) 07/31/2018 1800   BILIRUBINUR NEGATIVE 07/31/2018 1800   KETONESUR NEGATIVE 07/31/2018 1800  PROTEINUR >=300 (A) 07/31/2018 1800   NITRITE NEGATIVE 07/31/2018 1800   LEUKOCYTESUR MODERATE (A) 07/31/2018 1800   Sepsis Labs Invalid input(s): PROCALCITONIN,  WBC,  LACTICIDVEN Microbiology Recent Results (from the past 240 hour(s))  Body fluid culture     Status: None   Collection Time: 04/06/19 11:55 AM   Specimen: Body Fluid  Result Value Ref Range Status   Specimen Description   Final    FLUID Performed at [facility], [place] [address], [place], [place] [address]    Special Requests   Final    PERITONEAL Performed at [facility], [place], [place] [address]    Gram Stain NO WBC SEEN NO ORGANISMS SEEN   Final   Culture   Final    NO GROWTH 3 DAYS Performed at [facility], [place] [address], [place], [place] [address]    Report Status 04/12/2019 FINAL  Final  GI pathogen panel by PCR, stool     Status: None   Collection Time: 04/09/19  2:31 PM   Specimen: Stool  Result Value Ref Range  Status   Plesiomonas shigelloides NOT DETECTED NOT DETECTED Final   Yersinia enterocolitica NOT DETECTED NOT DETECTED Final   Vibrio NOT DETECTED NOT DETECTED Final   Enteropathogenic E coli NOT DETECTED NOT DETECTED Final   E coli (ETEC) LT/ST NOT DETECTED NOT DETECTED Final   E coli 5750 by PCR Not applicable NOT DETECTED Final   Cryptosporidium by PCR NOT DETECTED NOT DETECTED Final   Entamoeba histolytica NOT DETECTED NOT DETECTED Final   Adenovirus F 40/41 NOT DETECTED NOT DETECTED Final   Norovirus GI/GII NOT DETECTED NOT DETECTED Final   Sapovirus NOT DETECTED NOT DETECTED Final    Comment: (NOTE) Performed At: [facility] [place], [place] [id number] Rush Farmer MD PG:[id number]    Vibrio cholerae NOT DETECTED NOT DETECTED Final   Campylobacter by PCR NOT DETECTED NOT DETECTED Final   Salmonella by PCR NOT DETECTED NOT DETECTED Final   E coli (STEC) NOT DETECTED NOT DETECTED Final   Enteroaggregative E coli NOT DETECTED NOT DETECTED Final   Shigella by PCR NOT DETECTED NOT DETECTED Final   Cyclospora cayetanensis NOT DETECTED NOT DETECTED Final   Astrovirus NOT DETECTED NOT DETECTED Final   G lamblia by PCR NOT DETECTED NOT DETECTED Final   Rotavirus A by PCR NOT DETECTED NOT DETECTED Final     Time coordinating discharge: Over 30 minutes  SIGNED:   Ezekiel Slocumb, DO [facility] 04/13/2019, 1:25 PM   If 7PM-7AM, please contact night-coverage [email or web address] Password [email or web address]

## 2019-04-11 NOTE — TOC Transition Note (Signed)
Transition of Care [facility]) - CM/SW Discharge Note   Patient Details  Name: Simcha Farrington MRN: [id number] Date of Birth: 1965/12/01  Transition of Care [facility]) CM/SW Contact:  Beverly Sessions, RN Phone Number: 04/11/2019, 2:21 PM   Clinical Narrative:    Patient to discharge home today  PT has seen patient today, recommendation still for SNF.  Patient declines.    Wheelchair ordered for discharge.  Per Leroy Sea with [employer] Wheelchair will be delivered to the patient's home today. Bedside RN notified  Corene Cornea with [place] notified of discharge   BSC, and RW in the room for patient to take with her at discharge.  Patient remains on RA   Final next level of care: [place] Services Barriers to Discharge: No Barriers Identified   Patient Goals and CMS Choice   CMS Medicare.gov Compare Post Acute Care list provided to:: Patient Choice offered to / list presented to : Patient  Discharge Placement                       Discharge Plan and Services   Discharge Planning Services: CM Consult Post Acute Care Choice: Home Health          DME Arranged: Wheelchair manual DME Agency: [employer] Date DME Agency Contacted: 04/05/19 Time DME Agency Contacted: 1200 Representative spoke with at DME Agency: [place]: PT, OT [place] Agency: [place] ([place]) Date [place]: 04/11/19 Time [place]: 1300 Representative spoke with at [place]: [place] ([place]) Interventions     Readmission Risk Interventions Readmission Risk Prevention Plan 04/05/2019  Transportation Screening Complete  Medication Review Press photographer) [place] Patient Refused  Some recent data might be hidden

## 2019-04-11 NOTE — Progress Notes (Signed)
Physical Therapy Treatment Patient Details Name: Janet Mitchell MRN: [id number] DOB: 07-May-1965 Today's Date: 04/11/2019    History of Present Illness 54 y.o. female with medical history significant for ESRD on peritoneal dialysis, insulin-dependent type 2 diabetes, hypertension, and recent admission for COVID-19 infection 03/25/2019-03/30/2019 who presents to the ED for dialysis needs.  Since d/c she has been to the ED multiple times with general weakness and slurred speech. Imaging has been negative, pt reports she continues to be very weak.    PT Comments    Pt resting in bed upon PT arrival.  Therapist called pt prior to arriving and pt reporting 7/10 abdominal pain but pt also stated she did not need any pain medications prior to PT session.  Pt able to perform semi-supine to sitting edge of bed with SBA.  Then (with increased effort) pt able to stand from bed up to walker, take a few steps in place, and then walk a few feet bed to recliner with RW (mild increased effort to perform but pt steady).   Pt rested in chair and room set-up for ambulation trial.  When pt attempted to stand from recliner, pt unable with 1 assist (x4 trials with max cueing, repositioning, and extended rest breaks to attempt to facilitate standing).  Pt eventually reporting she was limited d/t abdominal pain and unable to stand d/t this (nurse notified of pt's request for pain meds).  Recommend STR but pt would like to discharge home instead d/t missing her family (pt reports her husband is able to provide required assist for safe home discharge).  MD and CM notified of PT session, recommendations, and DME needs.   Patient suffers from ESRD on peritoneal dialysis, lower abdominal pain, and recent COVID-19 infection which impairs her ability to perform daily activities like toileting, dressing, grooming, bathing in the home. A cane, walker, crutch will not resolve the patient's issue with performing activities of daily  living. A lightweight wheelchair is required/recommended and will allow patient to safely perform daily activities.   Patient can safely propel the wheelchair in the home or has a caregiver who can provide assistance.    Follow Up Recommendations  SNF     Equipment Recommendations  Rolling walker with 5" wheels;3in1 (PT);Wheelchair (measurements PT);Wheelchair cushion (measurements PT)    Recommendations for Other Services       Precautions / Restrictions Precautions Precautions: Fall Restrictions Weight Bearing Restrictions: No    Mobility  Bed Mobility Overal bed mobility: Needs Assistance       Supine to sit: Supervision;HOB elevated     General bed mobility comments: mild increased effort to perform on own  Transfers Overall transfer level: Needs assistance Equipment used: Rolling walker (2 wheeled) Transfers: Sit to/from Stand Sit to Stand: Min guard;Total assist         General transfer comment: increased effort to stand from bed up to walker (CGA provided for safety); attempted 4 trials to stand from recliner with 1 assist but even with max cueing and repositioning unable to stand pt with 1 assist  Ambulation/Gait Ambulation/Gait assistance: Min guard Gait Distance (Feet): 3 Feet(bed to recliner) Assistive device: Rolling walker (2 wheeled)   Gait velocity: decreased   General Gait Details: pt able to take steps in place (3 steps B LE's) and then walk short distance bed to recliner with RW; mild increased effort to perform on own (CGA provided for safety) but overall pt steady with walker use   Stairs  Wheelchair Mobility    Modified Rankin (Stroke Patients Only)       Balance Overall balance assessment: Needs assistance Sitting-balance support: No upper extremity supported;Feet supported Sitting balance-Leahy Scale: Good Sitting balance - Comments: steady sitting reaching within BOS     Standing balance-Leahy Scale:  Poor Standing balance comment: pt requiring B UE support on RW for static standing balance                            Cognition Arousal/Alertness: Awake/alert Behavior During Therapy: WFL for tasks assessed/performed Overall Cognitive Status: Within Functional Limits for tasks assessed                                        Exercises Total Joint Exercises Ankle Circles/Pumps: AROM;Strengthening;Both;10 reps;Seated Long Arc Quad: AROM;Strengthening;Both;10 reps;Seated General Exercises - Lower Extremity Hip Flexion/Marching: AROM;Strengthening;Both;10 reps;Seated Other Exercises Other Exercises: x10 AROM B opening/closing of hands, B elbow flexion/extension AROM, and B shoulder shrugs    General Comments  Pt agreeable to PT session.      Pertinent Vitals/Pain Pain Assessment: 0-10 Pain Score: 8  Pain Location: lower abdomen Pain Descriptors / Indicators: Aching;Grimacing;Guarding Pain Intervention(s): Limited activity within patient's tolerance;Monitored during session;Repositioned;Patient requesting pain meds-RN notified  Vitals (HR and O2 on room air) stable and WFL throughout treatment session.    Home Living                      Prior Function            PT Goals (current goals can now be found in the care plan section) Acute Rehab PT Goals Patient Stated Goal: have more energy to do for herself PT Goal Formulation: With patient Time For Goal Achievement: 04/19/19 Potential to Achieve Goals: Fair Progress towards PT goals: Progressing toward goals    Frequency    Min 2X/week      PT Plan Current plan remains appropriate    Co-evaluation              AM-PAC PT "6 Clicks" Mobility   Outcome Measure  Help needed turning from your back to your side while in a flat bed without using bedrails?: A Little Help needed moving from lying on your back to sitting on the side of a flat bed without using bedrails?: A  Little Help needed moving to and from a bed to a chair (including a wheelchair)?: A Little Help needed standing up from a chair using your arms (e.g., wheelchair or bedside chair)?: Total Help needed to walk in hospital room?: Total Help needed climbing 3-5 steps with a railing? : Total 6 Click Score: 12    End of Session Equipment Utilized During Treatment: Gait belt(up high away from PD site) Activity Tolerance: Patient limited by fatigue Patient left: in chair;with call bell/phone within reach;with chair alarm set;with nursing/sitter in room Nurse Communication: Mobility status;Precautions;Other (comment)(Need for 2 person assist back to bed for safety) PT Visit Diagnosis: Muscle weakness (generalized) (M62.81);Difficulty in walking, not elsewhere classified (R26.2);Unsteadiness on feet (R26.81);Repeated falls (R29.6)     Time: 1120-1200 PT Time Calculation (min) (ACUTE ONLY): 40 min  Charges:  $Therapeutic Exercise: 8-22 mins $Therapeutic Activity: 23-37 mins                     Leitha Bleak, PT 04/11/19,  1:37 PM

## 2019-04-11 NOTE — Progress Notes (Signed)
[facility]  ROUNDING NOTE   Subjective:   Patient states her abdominal pain is gone. She tolerated peritoneal dialysis well last night.   Objective:  Vital signs in last 24 hours:  Temp:  [97.8 F (36.6 C)-99.2 F (37.3 C)] 97.8 F (36.6 C) (01/14 1537) Pulse Rate:  [78-82] 80 (01/14 1541) Resp:  [16] 16 (01/14 1533) BP: (90-131)/(50-77) 113/50 (01/14 1537) SpO2:  [89 %-100 %] 99 % (01/14 1603) Weight:  [113.7 kg] 113.7 kg (01/14 0612)  Weight change: 3.617 kg Filed Weights   04/09/19 0339 04/10/19 0500 04/11/19 0612  Weight: 115.1 kg 110.1 kg 113.7 kg    Intake/Output: I/O last 3 completed shifts: In: 2795.1 [P.O.:120; I.V.:1675.1; Blood:300; IV Piggyback:700] Out: -    Intake/Output this shift:  No intake/output data recorded.  Physical Exam: General: NAD, laying in bed  Head: Normocephalic, atraumatic. Moist oral mucosal membranes  Eyes: Anicteric, PERRL  Neck: Supple, trachea midline  Lungs:  Clear to auscultation  Heart: Regular rate and rhythm  Abdomen:  Right and left lower quadrant tenderness   Extremities:  no peripheral edema.  Neurologic: Nonfocal, moving all four extremities  Skin: No lesions  Access: PD catheter    Basic Metabolic Panel: Recent Labs  Lab 04/07/19 0514 04/07/19 0514 04/08/19 0611 04/08/19 0611 04/09/19 1144 04/10/19 0502 04/11/19 0444  NA 130*  --  130*  --  133* 130* 131*  K 3.6  --  3.8  --  6.6* 4.1 3.9  CL 90*  --  93*  --  106 94* 96*  CO2 20*  --  21*  --  18* 22 20*  GLUCOSE 176*  --  164*  --  59* 152* 174*  BUN 117*  --  106*  --  82* 88* 81*  CREATININE 14.00*  --  12.94*  --  10.00* 11.04* 10.71*  CALCIUM 7.0*   < > 6.9*   < > 5.8* 7.2* 7.5*  PHOS 7.9*  --   --   --  4.7* 5.6* 5.4*   < > = values in this interval not displayed.    Liver Function Tests: Recent Labs  Lab 04/07/19 0514 04/09/19 1144 04/10/19 0502 04/11/19 0444  ALBUMIN 1.7* 1.4* 1.6* 1.5*   No results for input(s):  LIPASE, AMYLASE in the last 168 hours. No results for input(s): AMMONIA in the last 168 hours.  CBC: Recent Labs  Lab 04/07/19 0514 04/08/19 0611 04/09/19 1144 04/10/19 0502 04/11/19 0444  WBC 10.4 9.0 8.2 9.2 15.9*  HGB 9.0* 8.7* 7.0* 9.4* 9.1*  HCT 26.7* 26.3* 21.5* 27.8* 27.7*  MCV 66.1* 67.8* 69.1* 68.3* 69.3*  PLT 141* 144* 131* 141* 147*    Cardiac Enzymes: No results for input(s): CKTOTAL, CKMB, CKMBINDEX, TROPONINI in the last 168 hours.  BNP: Invalid input(s): POCBNP  CBG: Recent Labs  Lab 04/11/19 0802 04/11/19 1204 04/11/19 1532 04/11/19 1710 04/11/19 1726  GLUCAP 209* 78 149* 183* 175*    Microbiology: Results for orders placed or performed during the hospital encounter of 04/03/19  Body fluid culture     Status: None (Preliminary result)   Collection Time: 04/06/19 11:55 AM   Specimen: Body Fluid  Result Value Ref Range Status   Specimen Description   Final    FLUID Performed at [facility], [place] [address] [place], [place] [address]    Special Requests   Final    PERITONEAL Performed at [facility], [place], [place]  [address]    Gram Stain NO WBC SEEN NO ORGANISMS SEEN   Final   Culture   Final    NO GROWTH 3 DAYS Performed at [facility], [place] [address], [place], [place] [address]    Report Status PENDING  Incomplete    Coagulation Studies: No results for input(s): LABPROT, INR in the last 72 hours.  Urinalysis: No results for input(s): COLORURINE, LABSPEC, PHURINE, GLUCOSEU, HGBUR, BILIRUBINUR, KETONESUR, PROTEINUR, UROBILINOGEN, NITRITE, LEUKOCYTESUR in the last 72 hours.  Invalid input(s): APPERANCEUR    Imaging: No results found.   Medications:   . sodium chloride    . dialysis solution 1.5% low-MG/low-CA     . acetaminophen  650 mg Oral Once  . calcium acetate  1,334 mg Oral TID WC  . cholestyramine  4 g Oral BID  . dicyclomine  20 mg Oral TID AC & HS  . feeding supplement  1  Container Oral TID BM  . feeding supplement (PRO-STAT SUGAR FREE 64)  30 mL Oral TID  . gentamicin cream  1 application Topical Daily  . insulin aspart  0-5 Units Subcutaneous QHS  . insulin aspart  0-9 Units Subcutaneous TID WC  . meclizine  12.5 mg Oral TID  . multivitamin  1 tablet Oral QHS  . pantoprazole (PROTONIX) IV  40 mg Intravenous Q12H   sodium chloride, acetaminophen **OR** acetaminophen, Gerhardt's butt cream, Ipratropium-Albuterol, ondansetron (ZOFRAN) IV, oxyCODONE  Assessment/ Plan:  54 y.o. female   Janet Mitchell is a 54 y.o. black female with end stage renal disease on peritoneal dialysis, hypertension, diabetes mellitus type II, congestive heart failure who presents to [facility] on [facility]03[facility]2019 for Hyponatremia [E87.1] Elevated BUN [R79.9] ESRD (end stage renal disease) ([place]) [N18.6] ESRD on peritoneal dialysis ([place]) [N18.6, Z99.2] COVID-19 [U07.1] Black stool [M78.6]   Complicated admission to [facility] for COVID-19 infection  [facility] 114kg Peritoneal Dialysis CCPD 12 hours 2857mL fills 4 exchanges last fill of 2532mL extraneal  1. End Stage Renal Disease: on peritoneal dialysis. Negative ultrafiltration. Suspect hypovolemia.  -Continue peritoneal dialysis home prescription   2. Hypertension:  Home regimen of losartan, amlodipine, labetalol, doxazosin.  Currently holding all agents. Suspect patient is hypovolemic  3. Anemia of chronic kidney disease: PRBC transfusion on 1/12. EPO 20000 subcu units on 1/12.  - Appreciate GI input.   4. Secondary Hyperparathyroidism: with hyperphosphatemia  - calcium acetate   LOS: 7 Janet Mitchell 1/14/20215:47 PM

## 2019-04-11 NOTE — Progress Notes (Signed)
Per MD okay for RN to DC IVF.

## 2019-04-11 NOTE — Progress Notes (Signed)
PROGRESS NOTE    Janet Mitchell  URK:[id number] DOB: October 01, 1965 DOA: 04/03/2019  PCP: Lucianne Lei, MD    LOS - 7   Brief Narrative:  54 y.o.femalewith medical history ofanemia due to ESRD on peritoneal dialysis, history of blood transfusion, CHF, chronic cholecystitis with calculus, ESRD on PD, history of headaches, history of hypertension, spinal headaches, type 2 diabetes mellitus who presented to the ED withnausea, vomiting and diarrhea x approximately one month.  Loose stools noted since her cholecystectomy.  Reported cough as well, sometimes with cough-induced emesis.  She was seen by GI outpatient, prescribed Levaquin, not started before this admission. Was taking Imodium at home without much effect.  In the ED, afebrile, hypoxic 88% on room air, COVID-19 antigen negative but PCR POSITIVE.  Hyponatremic 127.  Severe hypoalbuminemia 1.9, creatinine 9.53 consistent with ESRD. Inflammatory markers were elevated (ferritin was 1474, CRP 27.9, D-dimer 2.97, LDH 248, fibrinogen more than 800).  Procalcitonin 1.70.  Chest xray showed diffuse bilateral airspace disease.  She was treated with IV fluids, potassium, Rocephin and azithromycin.  Patient admitted to hospitalist service, started on remdesivir, steroids, bronchodilators.  Nephrology consulted for PD management.  GI consulted for persistent abdominal pain and diarrhea.  Patient has chronic diarrhea likely bile salt associated s/p cholecystectomy.  Started on cholestyramine, Bentyl, Protonix.  Subjective 1/14: Patient continues to have abdominal pain, but somewhat better.  Aggravated by movement.  Worked with PT, up from bed to chair and had too much pain to get up from chair.  She requests discharge home, declines rehab, states family will be able to help her at home.  Home health and DME were arranged.  Patient subsequently was found to be hypoxic by nurse.  To stay for pulse ox overnight in case of need for oxygen.  Has not required during  stay so far, suspect due to pain medication as this occurred very shortly after getting dilaudid.  Assessment & Plan:   Principal Problem:   ESRD on peritoneal dialysis [facility]) Active Problems:   Essential hypertension   Type 2 diabetes mellitus with ESRD (end-stage renal disease) (HCC)   Chronic diarrhea   Black stool   Uremia, acute   Weakness   Vertigo   Acute blood loss anemia   Hyperkalemia   Hypotension   Acute Hypoxic Respiratory Failure likely secondary to pain medication.  No respiratory symptoms, no hypoxia thus far during stay, and developed shortly after getting dilaudid this afternoon. --overnight continuous pulse ox --if not resolved in AM, will evaluate further with imaging   End-stage renal disease on peritoneal dialysis Acute uremia -present on admission --Nephrology consulted --Continue peritoneal dialysis while admitted --IV fluids per nephrology  Lower abdominal pain Diarrhea -chronic since cholecystectomy.  No C. difficile testing indicated, per ID.  CT abdomen pelvis on 1/11 showed abnormal thick walled small bowel loops in the left abdomen concerning for infectious or inflammatory enteritis, small amount of free fluid in peritoneal dialysis catheter in the left anterior lower abdomen. --Appreciate GI input --Continue cholestyramine and Bentyl --Continue protonix - transition to oral --Nephrology sent peritoneal fluid to lab for culture, no growth date, no WBCs, no organisms --stopped antibiotics  Acute anemia superimposed on anemia of chronic disease -status post transfusion 1 unit packed red cells on 1/12 due to hemoglobin 7.0.  Stool guaiac positive and black in color on admission.  Hemoglobin 9.4 today 1/13. --Aspirin and heparin discontinued --Continue IV Protonix --Appreciate GI input  Hyperkalemia -resolved with dialysis --BMP daily to  monitor  Generalized weakness -MRI brain was obtained on 1/5 and was negative for acute findings.   --PT  evaluation, SNF versus home with home health PT --OT recommending home health and 24-hour supervision/assistance  Recent COVID-19 infection -status post treatment with remdesivir and steroids.  Type 2 diabetes -  --Lantus on hold due to patient not eating  Vertigo --Trial of meclizine   DVT prophylaxis: SCDs   Code Status: Full Code  Family Communication: None at bedside Disposition Plan: Expect medically stable for discharge in 1 to 2 days, SNF versus home with home health   Consultants:  Nephrology Gastroenterology   Procedures:   Peritoneal dialysis  Antimicrobials:   Rocephin and Flagyl -started 1/12, stop TBD   Objective: Vitals:   04/11/19 1537 04/11/19 1541 04/11/19 1603 04/11/19 2056  BP: (!) 113/50   118/84  Pulse: 80 80  82  Resp:    20  Temp: 97.8 F (36.6 C)   98.2 F (36.8 C)  TempSrc: Oral   Oral  SpO2: 100% 97% 99% 100%  Weight:      Height:        Intake/Output Summary (Last 24 hours) at 04/11/2019 2140 Last data filed at 04/11/2019 0300 Gross per 24 hour  Intake 846.17 ml  Output --  Net 846.17 ml   Filed Weights   04/09/19 0339 04/10/19 0500 04/11/19 0612  Weight: 115.1 kg 110.1 kg 113.7 kg    Examination:  General exam: awake, alert, no acute distress HEENT: moist mucus membranes, hearing grossly normal  Respiratory system: clear to auscultation bilaterally, no wheezes, rales or rhonchi, normal respiratory effort. Cardiovascular system: normal S1/S2, RRR, no JVD, murmurs, rubs, gallops, no pedal edema.   Gastrointestinal system: soft, diffusely tender worse across lower abdomrn, non-distended, PD catheter in place Central nervous system: alert and oriented x4. no gross focal neurologic deficits, normal speech Extremities: moves all, no edema, normal tone Skin: dry, intact, normal temperature Psychiatry: normal mood, congruent affect, judgement and insight appear normal    Data Reviewed: I have personally reviewed  following labs and imaging studies  CBC: Recent Labs  Lab 04/07/19 0514 04/08/19 0611 04/09/19 1144 04/10/19 0502 04/11/19 0444  WBC 10.4 9.0 8.2 9.2 15.9*  HGB 9.0* 8.7* 7.0* 9.4* 9.1*  HCT 26.7* 26.3* 21.5* 27.8* 27.7*  MCV 66.1* 67.8* 69.1* 68.3* 69.3*  PLT 141* 144* 131* 141* 989*   Basic Metabolic Panel: Recent Labs  Lab 04/07/19 0514 04/08/19 0611 04/09/19 1144 04/10/19 0502 04/11/19 0444  NA 130* 130* 133* 130* 131*  K 3.6 3.8 6.6* 4.1 3.9  CL 90* 93* 106 94* 96*  CO2 20* 21* 18* 22 20*  GLUCOSE 176* 164* 59* 152* 174*  BUN 117* 106* 82* 88* 81*  CREATININE 14.00* 12.94* 10.00* 11.04* 10.71*  CALCIUM 7.0* 6.9* 5.8* 7.2* 7.5*  PHOS 7.9*  --  4.7* 5.6* 5.4*   GFR: Estimated Creatinine Clearance: 8.2 mL/min (A) (by C-G formula based on SCr of 10.71 mg/dL (H)). Liver Function Tests: Recent Labs  Lab 04/07/19 0514 04/09/19 1144 04/10/19 0502 04/11/19 0444  ALBUMIN 1.7* 1.4* 1.6* 1.5*   No results for input(s): LIPASE, AMYLASE in the last 168 hours. No results for input(s): AMMONIA in the last 168 hours. Coagulation Profile: No results for input(s): INR, PROTIME in the last 168 hours. Cardiac Enzymes: No results for input(s): CKTOTAL, CKMB, CKMBINDEX, TROPONINI in the last 168 hours. BNP (last 3 results) No results for input(s): PROBNP in the last 8760 hours. HbA1C:  No results for input(s): HGBA1C in the last 72 hours. CBG: Recent Labs  Lab 04/11/19 1204 04/11/19 1532 04/11/19 1710 04/11/19 1726 04/11/19 2058  GLUCAP 78 149* 183* 175* 165*   Lipid Profile: No results for input(s): CHOL, HDL, LDLCALC, TRIG, CHOLHDL, LDLDIRECT in the last 72 hours. Thyroid Function Tests: No results for input(s): TSH, T4TOTAL, FREET4, T3FREE, THYROIDAB in the last 72 hours. Anemia Panel: No results for input(s): VITAMINB12, FOLATE, FERRITIN, TIBC, IRON, RETICCTPCT in the last 72 hours. Sepsis Labs: No results for input(s): PROCALCITON, LATICACIDVEN in the last  168 hours.  Recent Results (from the past 240 hour(s))  Body fluid culture     Status: None (Preliminary result)   Collection Time: 04/06/19 11:55 AM   Specimen: Body Fluid  Result Value Ref Range Status   Specimen Description   Final    FLUID Performed at [facility], [place] [address], [place], [place] [address]    Special Requests   Final    PERITONEAL Performed at [facility], [place], [place] [address]    Gram Stain NO WBC SEEN NO ORGANISMS SEEN   Final   Culture   Final    NO GROWTH 3 DAYS Performed at [facility], [place] [address] [place],  [address]    Report Status PENDING  Incomplete         Radiology Studies: No results found.      Scheduled Meds: . acetaminophen  650 mg Oral Once  . calcium acetate  1,334 mg Oral TID WC  . cholestyramine  4 g Oral BID  . dicyclomine  20 mg Oral TID AC & HS  . feeding supplement  1 Container Oral TID BM  . feeding supplement (PRO-STAT SUGAR FREE 64)  30 mL Oral TID  . gentamicin cream  1 application Topical Daily  . insulin aspart  0-5 Units Subcutaneous QHS  . insulin aspart  0-9 Units Subcutaneous TID WC  . meclizine  12.5 mg Oral TID  . multivitamin  1 tablet Oral QHS  . pantoprazole (PROTONIX) IV  40 mg Intravenous Q12H   Continuous Infusions: . sodium chloride    . dialysis solution 1.5% low-MG/low-CA       LOS: 7 days    Time spent: 45-50 minutes    Ezekiel Slocumb, DO [facility]   If 7PM-7AM, please contact night-coverage [email or web address] Password [facility] 04/11/2019, 9:40 PM

## 2019-04-12 DIAGNOSIS — R1030 Lower abdominal pain, unspecified: Secondary | ICD-10-CM

## 2019-04-12 LAB — TYPE AND SCREEN
ABO/RH(D): A POS
Antibody Screen: NEGATIVE
Donor AG Type: NEGATIVE
Unit division: 0
Unit division: 0
Unit division: 0

## 2019-04-12 LAB — CBC WITH DIFFERENTIAL/PLATELET
Abs Immature Granulocytes: 0.15 10*3/uL — ABNORMAL HIGH (ref 0.00–0.07)
Basophils Absolute: 0.1 10*3/uL (ref 0.0–0.1)
Basophils Relative: 0 %
Eosinophils Absolute: 0.2 10*3/uL (ref 0.0–0.5)
Eosinophils Relative: 1 %
HCT: 27.1 % — ABNORMAL LOW (ref 36.0–46.0)
Hemoglobin: 9 g/dL — ABNORMAL LOW (ref 12.0–15.0)
Immature Granulocytes: 1 %
Lymphocytes Relative: 7 %
Lymphs Abs: 1.2 10*3/uL (ref 0.7–4.0)
MCH: 23 pg — ABNORMAL LOW (ref 26.0–34.0)
MCHC: 33.2 g/dL (ref 30.0–36.0)
MCV: 69.3 fL — ABNORMAL LOW (ref 80.0–100.0)
Monocytes Absolute: 1 10*3/uL (ref 0.1–1.0)
Monocytes Relative: 5 %
Neutro Abs: 16 10*3/uL — ABNORMAL HIGH (ref 1.7–7.7)
Neutrophils Relative %: 86 %
Platelets: 176 10*3/uL (ref 150–400)
RBC: 3.91 MIL/uL (ref 3.87–5.11)
RDW: 20 % — ABNORMAL HIGH (ref 11.5–15.5)
Smear Review: NORMAL
WBC: 18.5 10*3/uL — ABNORMAL HIGH (ref 4.0–10.5)
nRBC: 0 % (ref 0.0–0.2)

## 2019-04-12 LAB — PREPARE RBC (CROSSMATCH)

## 2019-04-12 LAB — BPAM RBC
Blood Product Expiration Date: 202101252359
Blood Product Expiration Date: 202101252359
Blood Product Expiration Date: 202101272359
ISSUE DATE / TIME: 202101121946
Unit Type and Rh: 5100
Unit Type and Rh: 9500
Unit Type and Rh: 9500

## 2019-04-12 LAB — GLUCOSE, CAPILLARY
Glucose-Capillary: 247 mg/dL — ABNORMAL HIGH (ref 70–99)
Glucose-Capillary: 215 mg/dL — ABNORMAL HIGH (ref 70–99)
Glucose-Capillary: 203 mg/dL — ABNORMAL HIGH (ref 70–99)
Glucose-Capillary: 170 mg/dL — ABNORMAL HIGH (ref 70–99)

## 2019-04-12 LAB — BODY FLUID CULTURE
Culture: NO GROWTH
Gram Stain: NONE SEEN

## 2019-04-12 MED ORDER — HYOSCYAMINE SULFATE ER 0.375 MG PO TB12
0.3750 mg | ORAL_TABLET | Freq: Two times a day (BID) | ORAL | Status: DC
  Administered 2019-04-12 – 2019-04-13 (×2): 0.375 mg via ORAL
  Filled 2019-04-12 (×3): qty 1

## 2019-04-12 MED ORDER — RIFAXIMIN 550 MG PO TABS
550.0000 mg | ORAL_TABLET | Freq: Two times a day (BID) | ORAL | Status: DC
  Administered 2019-04-12 – 2019-04-13 (×2): 550 mg via ORAL
  Filled 2019-04-12 (×3): qty 1

## 2019-04-12 MED ORDER — METRONIDAZOLE IN NACL 5-0.79 MG/ML-% IV SOLN
500.0000 mg | Freq: Three times a day (TID) | INTRAVENOUS | Status: DC
  Administered 2019-04-13 (×2): 500 mg via INTRAVENOUS
  Filled 2019-04-12 (×6): qty 100

## 2019-04-12 MED ORDER — AMITRIPTYLINE HCL 25 MG PO TABS
25.0000 mg | ORAL_TABLET | Freq: Every day | ORAL | Status: DC
  Administered 2019-04-12: 22:00:00 25 mg via ORAL
  Filled 2019-04-12 (×2): qty 1

## 2019-04-12 MED ORDER — SIMETHICONE 80 MG PO CHEW
160.0000 mg | CHEWABLE_TABLET | Freq: Four times a day (QID) | ORAL | Status: DC | PRN
  Administered 2019-04-12: 160 mg via ORAL
  Filled 2019-04-12: qty 2

## 2019-04-12 MED ORDER — SODIUM CHLORIDE 0.9 % IV SOLN
2.0000 g | Freq: Every day | INTRAVENOUS | Status: DC
  Administered 2019-04-13: 2 g via INTRAVENOUS
  Filled 2019-04-12: qty 2
  Filled 2019-04-12: qty 20

## 2019-04-12 MED ORDER — SIMETHICONE 80 MG PO CHEW: 160.0000 mg | CHEWABLE_TABLET | Freq: Four times a day (QID) | ORAL | Status: DC | PRN

## 2019-04-12 NOTE — Progress Notes (Signed)
[facility]  ROUNDING NOTE   Subjective:   PD last night. UF of 75.   Discharge held yesterday.   Objective:  Vital signs in last 24 hours:  Temp:  [97.8 F (36.6 C)-98.4 F (36.9 C)] 98 F (36.7 C) (01/15 1344) Pulse Rate:  [80-87] 87 (01/15 0701) Resp:  [16-20] 18 (01/15 1344) BP: (90-128)/(50-84) 127/63 (01/15 1344) SpO2:  [90 %-100 %] 90 % (01/15 1344)  Weight change:  Filed Weights   04/09/19 0339 04/10/19 0500 04/11/19 0612  Weight: 115.1 kg 110.1 kg 113.7 kg    Intake/Output: I/O last 3 completed shifts: In: 846.2 [P.O.:120; I.V.:726.2] Out: -    Intake/Output this shift:  Total I/O In: -  Out: 75 [Other:75]  Physical Exam: General: NAD, laying in bed  Head: Normocephalic, atraumatic. Moist oral mucosal membranes  Eyes: Anicteric, PERRL  Neck: Supple, trachea midline  Lungs:  Clear to auscultation  Heart: Regular rate and rhythm  Abdomen:  Right and left lower quadrant tenderness   Extremities:  no peripheral edema.  Neurologic: Nonfocal, moving all four extremities  Skin: No lesions  Access: PD catheter    Basic Metabolic Panel: Recent Labs  Lab 04/07/19 0514 04/07/19 0514 04/08/19 0611 04/08/19 0611 04/09/19 1144 04/10/19 0502 04/11/19 0444  NA 130*  --  130*  --  133* 130* 131*  K 3.6  --  3.8  --  6.6* 4.1 3.9  CL 90*  --  93*  --  106 94* 96*  CO2 20*  --  21*  --  18* 22 20*  GLUCOSE 176*  --  164*  --  59* 152* 174*  BUN 117*  --  106*  --  82* 88* 81*  CREATININE 14.00*  --  12.94*  --  10.00* 11.04* 10.71*  CALCIUM 7.0*   < > 6.9*   < > 5.8* 7.2* 7.5*  PHOS 7.9*  --   --   --  4.7* 5.6* 5.4*   < > = values in this interval not displayed.    Liver Function Tests: Recent Labs  Lab 04/07/19 0514 04/09/19 1144 04/10/19 0502 04/11/19 0444  ALBUMIN 1.7* 1.4* 1.6* 1.5*   No results for input(s): LIPASE, AMYLASE in the last 168 hours. No results for input(s): AMMONIA in the last 168 hours.  CBC: Recent Labs   Lab 04/08/19 0611 04/09/19 1144 04/10/19 0502 04/11/19 0444 04/12/19 1000  WBC 9.0 8.2 9.2 15.9* 18.5*  NEUTROABS  --   --   --   --  16.0*  HGB 8.7* 7.0* 9.4* 9.1* 9.0*  HCT 26.3* 21.5* 27.8* 27.7* 27.1*  MCV 67.8* 69.1* 68.3* 69.3* 69.3*  PLT 144* 131* 141* 147* 176    Cardiac Enzymes: No results for input(s): CKTOTAL, CKMB, CKMBINDEX, TROPONINI in the last 168 hours.  BNP: Invalid input(s): POCBNP  CBG: Recent Labs  Lab 04/11/19 1710 04/11/19 1726 04/11/19 2058 04/12/19 0738 04/12/19 1158  GLUCAP 183* 175* 165* 247* 215*    Microbiology: Results for orders placed or performed during the hospital encounter of 04/03/19  Body fluid culture     Status: None   Collection Time: 04/06/19 11:55 AM   Specimen: Body Fluid  Result Value Ref Range Status   Specimen Description   Final    FLUID Performed at [facility], [place] [address], [place], [place] [address]    Special Requests   Final    PERITONEAL Performed at [facility], [place],  [place], [place] [address]    Gram Stain NO WBC SEEN NO ORGANISMS SEEN   Final   Culture   Final    NO GROWTH 3 DAYS Performed at [facility], [place] [address], [place], [place] [address]    Report Status 04/12/2019 FINAL  Final    Coagulation Studies: No results for input(s): LABPROT, INR in the last 72 hours.  Urinalysis: No results for input(s): COLORURINE, LABSPEC, PHURINE, GLUCOSEU, HGBUR, BILIRUBINUR, KETONESUR, PROTEINUR, UROBILINOGEN, NITRITE, LEUKOCYTESUR in the last 72 hours.  Invalid input(s): APPERANCEUR    Imaging: No results found.   Medications:   . sodium chloride    . dialysis solution 1.5% low-MG/low-CA     . acetaminophen  650 mg Oral Once  . calcium acetate  1,334 mg Oral TID WC  . cholestyramine  4 g Oral BID  . dicyclomine  20 mg Oral TID AC & HS  . feeding supplement  1 Container Oral TID BM  . feeding supplement (PRO-STAT SUGAR FREE 64)  30 mL Oral TID  .  gentamicin cream  1 application Topical Daily  . insulin aspart  0-5 Units Subcutaneous QHS  . insulin aspart  0-9 Units Subcutaneous TID WC  . meclizine  12.5 mg Oral TID  . multivitamin  1 tablet Oral QHS  . pantoprazole (PROTONIX) IV  40 mg Intravenous Q12H   sodium chloride, acetaminophen **OR** acetaminophen, Gerhardt's butt cream, Ipratropium-Albuterol, ondansetron (ZOFRAN) IV, oxyCODONE, simethicone  Assessment/ Plan:  Janet Mitchell is a 54 y.o. black female with end stage renal disease on peritoneal dialysis, hypertension, diabetes mellitus type II, congestive heart failure who presents to [facility] on 04/03/2019 for Hyponatremia [E87.1] Elevated BUN [R79.9] ESRD (end stage renal disease) ([place]) [N18.6] ESRD on peritoneal dialysis ([place]) [N18.6, Z99.2] COVID-19 [U07.1] Black stool [Q03.4]   Complicated admission to [facility] for COVID-19 infection  [facility] 114kg Peritoneal Dialysis CCPD 12 hours 2832mL fills 4 exchanges last fill of 2543mL extraneal  1. End Stage Renal Disease: on peritoneal dialysis. Hypovolemia has improved.  -Continue peritoneal dialysis home prescription without last fill. Dextrose 1.5%.   2. Hypertension:  Home regimen of losartan, amlodipine, labetalol, doxazosin. Currently holding all agents. Suspect patient is hypovolemic  3. Anemia of chronic kidney disease: PRBC transfusion on 1/12. EPO 20000 subcu units on 1/12.  Hemoglobin is now stabe at 9. Microcytic - Appreciate GI input.   4. Secondary Hyperparathyroidism: with hyperphosphatemia  - calcium acetate   LOS: 8 Janet Mitchell 1/15/20212:31 PM

## 2019-04-12 NOTE — Progress Notes (Addendum)
Janet Darby, MD [address]  [place]  [place], [place] [address]  Main: [phone]  Fax: [phone] Pager: [phone]   Subjective: Patient is refusing to eat because of ongoing abdominal pain and postprandial diarrhea.  She states every time she eats, she has severe burning in her upper abdomen that goes down to her lower abdomen and everything goes straight through her.  She states her pain started 4 days ago after she came to the hospital.  Diarrhea has been chronic, ongoing for about 10 years since her gallbladder was removed.  She reports eating makes the pain worse, otherwise she does have constant lower abdominal pain  Objective:  Vital signs in last 24 hours: Vitals:   04/12/19 1441 04/12/19 1442 04/12/19 1443 04/12/19 1456  BP:    (!) 116/56  Pulse:    84  Resp:      Temp:    98.4 F (36.9 C)  TempSrc:    Oral  SpO2: (!) 88% 91% 91% 94%  Weight:      Height:       Weight change:   Intake/Output Summary (Last 24 hours) at 04/12/2019 1632 Last data filed at 04/12/2019 1030 Gross per 24 hour  Intake -  Output 75 ml  Net -75 ml     Exam: Periorbital puffiness, appears pale Heart:: Regular rate and rhythm, S1S2 present or without murmur or extra heart sounds Lungs: normal and clear to auscultation Abdomen: soft, diffuse abdominal tenderness, worse in lower and left mid abdomen, normal bowel sounds   Lab Results: CBC Latest Ref Rng & Units 04/12/2019 04/11/2019 04/10/2019  WBC 4.0 - 10.5 K/uL 18.5(H) 15.9(H) 9.2  Hemoglobin 12.0 - 15.0 g/dL 9.0(L) 9.1(L) 9.4(L)  Hematocrit 36.0 - 46.0 % 27.1(L) 27.7(L) 27.8(L)  Platelets 150 - 400 K/uL 176 147(L) 141(L)   CMP Latest Ref Rng & Units 04/11/2019 04/10/2019 04/09/2019  Glucose 70 - 99 mg/dL 174(H) 152(H) 59(L)  BUN 6 - 20 mg/dL 81(H) 88(H) 82(H)  Creatinine 0.44 - 1.00 mg/dL 10.71(H) 11.04(H) 10.00(H)  Sodium 135 - 145 mmol/L 131(L) 130(L) 133(L)  Potassium 3.5 - 5.1 mmol/L 3.9 4.1 6.6(HH)   Chloride 98 - 111 mmol/L 96(L) 94(L) 106  CO2 22 - 32 mmol/L 20(L) 22 18(L)  Calcium 8.9 - 10.3 mg/dL 7.5(L) 7.2(L) 5.8(LL)  Total Protein 6.5 - 8.1 g/dL - - -  Total Bilirubin 0.3 - 1.2 mg/dL - - -  Alkaline Phos 38 - 126 U/L - - -  AST 15 - 41 U/L - - -  ALT 0 - 44 U/L - - -    Micro Results: Recent Results (from the past 240 hour(s))  Body fluid culture     Status: None   Collection Time: 04/06/19 11:55 AM   Specimen: Body Fluid  Result Value Ref Range Status   Specimen Description   Final    FLUID Performed at [facility], [place] [address], [place], [place] [address]    Special Requests   Final    PERITONEAL Performed at [facility], [place], [place] [address]    Gram Stain NO WBC SEEN NO ORGANISMS SEEN   Final   Culture   Final    NO GROWTH 3 DAYS Performed at [facility], [place] [address] [place], [place] [address]    Report Status 04/12/2019 FINAL  Final   Studies/Results: No results found. Medications:  I have reviewed the patient's current medications.  Prior to Admission:  Medications Prior to Admission  Medication Sig Dispense Refill Last Dose  . ALPRAZolam (XANAX) 0.5 MG tablet Take 0.5 mg by mouth at bedtime as needed for anxiety.   04/02/2019 at Unknown time  . amLODipine (NORVASC) 10 MG tablet Take 1 tablet (10 mg total) by mouth daily. 30 tablet 3 04/02/2019 at Unknown time  . ascorbic acid (VITAMIN C) 500 MG tablet Take 1 tablet (500 mg total) by mouth daily. 10 tablet 0 04/02/2019 at Unknown time  . aspirin EC 81 MG tablet Take 81 mg by mouth daily.   04/02/2019 at Unknown time  . calcitRIOL (ROCALTROL) 0.5 MCG capsule Take 0.5-1 mcg by mouth See admin instructions. 0.14mg on Mondays through Fridays and take 134m on Saturdays and _0 0 mg Oral 2 times daily 04/12/19 1614 04/26/19 2159   04/09/19 1000  cefTRIAXone (ROCEPHIN) 2 g in sodium chloride 0.9 % 100 mL IVPB  Status:  Discontinued     2 g 200 mL/hr over 30 Minutes Intravenous Every 24 hours 04/09/19 0741 04/10/19 1417   04/09/19 0800  metroNIDAZOLE (FLAGYL) IVPB 500 mg  Status:  Discontinued     500 mg 100 mL/hr over 60 Minutes Intravenous Every 8 hours 04/09/19 0711 04/10/19 1417   04/09/19 0730  ciprofloxacin (CIPRO) IVPB 400 mg  Status:  Discontinued     400 mg 200 mL/hr over 60 Minutes Intravenous Every 24 hours 04/09/19 0727 04/09/19 0741     Scheduled Meds: . acetaminophen  650 mg Oral Once  . amitriptyline  25 mg Oral QHS  . calcium acetate  1,334 mg Oral TID WC  . cholestyramine  4 g Oral BID  . feeding supplement  1 Container Oral TID BM  . feeding supplement (PRO-STAT SUGAR FREE 64)  30 mL Oral TID  . gentamicin cream  1 application Topical Daily  . hyoscyamine  0.375 mg Oral Q12H  . insulin aspart  0-5 Units Subcutaneous QHS  . insulin aspart  0-9 Units Subcutaneous TID WC  . meclizine  12.5 mg Oral TID  . multivitamin  1 tablet Oral QHS  . pantoprazole (PROTONIX) IV  40 mg Intravenous Q12H  . rifaximin  550 mg Oral BID   Continuous Infusions: . sodium chloride    . dialysis solution 1.5% low-MG/low-CA     PRN Meds:.sodium chloride, acetaminophen **OR** acetaminophen, Gerhardt's butt cream, Ipratropium-Albuterol, ondansetron (ZOFRAN) IV, oxyCODONE, simethicone   Assessment: Principal Problem:   ESRD on peritoneal dialysis [facility]) Active Problems:   Essential hypertension   Type 2 diabetes mellitus with ESRD (end-stage renal disease) (HCC)   Chronic diarrhea   Black stool   Uremia, acute   Weakness   Vertigo   Acute blood loss anemia   Hyperkalemia   Hypotension   Plan:  Chronic nonbloody diarrhea, generalized abdominal pain Most likely bile salt associated diarrhea.  Stool studies negative for infection Patient has severe hypoalbuminemia, resulting in bowel wall edema and third space loss likely explanation for thickened small bowel loops seen on the CT scan.  She does not have leukocytosis.  And the diarrhea has been chronic, less likely infectious etiology Colonoscopy with biopsies in 2020 did not reveal microscopic colitis or inflammatory bowel disease Continue cholestyramine or colestipol 4 g 2-3 times a day  Ongoing abdominal pain, worse postprandial, Unclear etiology Switch from Bentyl to Levsin 0.375, twice daily Trial of rifaximin 550 mg twice daily for possible bacterial overgrowth Trial of amitriptyline 25 mg at bedtime Avoid narcotic use for abdominal pain Consider GYN consult ? Sickle cell crisis, ?  Chronic mesenteric ischemia CT abdomen and pelvis during this admission revealed heavily calcified aorta and branch vessels.  This was without contrast.  Consider mesenteric Duplex to evaluate for any celiac artery/SMA stenosis Can consider repeat EGD/flex sig  Upper abdominal burning pain Continue Protonix 40 mg IV twice daily H. pylori IgG negative Celiac panel negative  Acute on chronic microcytic anemia: No evidence of GI bleed Ferritin is elevated, normal B12 and folate panel Check serum copper Peripheral blood smear to evaluate for sickle cell anemia/crisis Consider hematology consult  New onset of leukocytosis: Afebrile Not on steroids, if worsening will need work-up of infection  Severe hypoalbuminemia: Patient has history of nephrotic syndrome Discussed with Dr. Juleen China Likely combination of underlying nephrotic syndrome plus peritoneal dialysis plus malnutrition Chronic diarrhea is bile salt induced postcholecystectomy, not secondary to malabsorption No evidence of liver disease  Dr. Vicente Males will  cover from tomorrow   LOS: 8  days   Janet Mitchell 04/12/2019, 4:32 PM

## 2019-04-12 NOTE — TOC Progression Note (Signed)
Transition of Care [facility]) - Progression Note    Patient Details  Name: Janet Mitchell MRN: [id number] Date of Birth: 20-May-1965  Transition of Care [facility]) CM/SW Contact  Beverly Sessions, RN Phone Number: 04/12/2019, 4:18 PM  Clinical Narrative:     Bedside RN to check sats to see is patient qualify for continuous O2.  If not patient to have overnight pulse ox to determine if she meets for nocturnal O2  Expected Discharge Plan: [place] Barriers to Discharge: No Barriers Identified  Expected Discharge Plan and Services Expected Discharge Plan: [place]   Discharge Planning Services: CM Consult Post Acute Care Choice: Home Health   Expected Discharge Date: 04/11/19               DME Arranged: Wheelchair manual DME Agency: [employer] Date DME Agency Contacted: 04/05/19 Time DME Agency Contacted: 1200 Representative spoke with at DME Agency: [place]: PT, OT [place] Agency: [place] ([place]) Date [place]: 04/11/19 Time [place]: 1300 Representative spoke with at [place]: [place] ([place]) Interventions    Readmission Risk Interventions Readmission Risk Prevention Plan 04/05/2019  Transportation Screening Complete  Medication Review Press photographer) [place] Patient Refused  Some recent data might be hidden

## 2019-04-12 NOTE — Progress Notes (Signed)
PROGRESS NOTE    Janet Mitchell  MVH:[id number] DOB: 1965/12/20 DOA: 04/03/2019  PCP: Lucianne Lei, MD    LOS - 8   Brief Narrative:  54 y.o.femalewith medical history ofanemia due to [facility] peritoneal dialysis, history of blood transfusion, CHF, chronic cholecystitis s/p cholecystectomy, ESRD on PD, history of headaches, history of hypertension, spinal headaches, type 2 diabetes mellituswho presented to the ED withnausea, vomiting and diarrheax approximately onemonth. Loose stools noted since her cholecystectomy. She was seen by GI outpatient, prescribed Levaquin, not started before this admission. Was taking Imodium at home without much effect. In the ED, afebrile, hypoxic 88% on room air, COVID-19 antigen negative but PCR POSITIVE. Hyponatremic 127. Severe hypoalbuminemia 1.9, creatinine 9.53 consistent with ESRD. Inflammatory markers were elevated.  Procalcitonin 1.70. Chest xray showed diffuse bilateral airspace disease. She was treated with IV fluids, potassium, Rocephin and azithromycin. Patient admitted to hospitalist service, started on remdesivir, steroids, bronchodilators. Nephrology consulted for PD management.  GI consulted for persistent abdominal pain and diarrhea.  Patient has chronic diarrhea likely bile salt associated s/p cholecystectomy.  Started on cholestyramine, Bentyl, Protonix.  Subjective 1/15: Patient became hypoxic yesterday afternoon after getting pain medicine.  Not previously hypoxic.  Today, patient was unable to ambulate for home oxygen evaluation due to dizziness.  She states she often gets dizzy on standing and it goes away if she waits a minute.  Also says she knows her O2 sat drops into 80's sometimes at home.  Does not use home oxygen.  No fever/chills, SOB or other new complaints.  Continues to have significant lower abdominal pain.  Agrees to trial of simethicone.    Assessment & Plan:   Principal Problem:   ESRD on peritoneal dialysis  [facility]) Active Problems:   Essential hypertension   Type 2 diabetes mellitus with ESRD (end-stage renal disease) (HCC)   Chronic diarrhea   Black stool   Uremia, acute   Weakness   Vertigo   Acute blood loss anemia   Hyperkalemia   Hypotension   Acute Hypoxic Respiratory Failure likely secondary to pain medication.  No respiratory symptoms, no hypoxia thus far during stay, and developed shortly after getting dilaudid this afternoon.   --overnight pulse ox study tonight as data was not captured last night and patient didn't tolerated ambulatory oxygen evaluation --if not resolved in AM, will evaluate further with imaging   End-stage renal disease on peritoneal dialysis Acute uremia-present on admission --Nephrology consulted --Continue peritoneal dialysis while admitted --IV fluids per nephrology  Generalized abdominal pain - worse postprandial, debilitating at times Diarrhea-chronic, nonbloody, since cholecystectomy so most likely bile salt related.No C. difficile testing indicated, per ID.  Stool studies negative for infection.  CT abdomen pelvis on 1/11 showedabnormal thick walled small bowel loops in the left abdomen concerning for infectious or inflammatory enteritis, small amount of free fluid in peritoneal dialysis catheter in the left anterior lower abdomen.  H pylori negative. --Appreciate GI input --cont cholestyramine, change Bentyl to Levsin BID --trial of rifaximin for possible bacterial overgrowth --trial of amitriptyline at bedtime --avoid narcotics for abdominal pain --consider GYN referral outpatient if not improving --will get mesenteric duplex to evaluate celiac/SMA stenosis (prior CT non-contrast but heavily calcified major arteries). --Continue protonix - transition to oral --Nephrology sent peritoneal fluid to lab for culture,no growth date, no WBCs, no organisms --stopped antibiotics, but now with rising white count --start Rocephin and Flagyl --follow  up GI pathogen panel --  Acute anemia superimposed on anemia of chronic  disease-status post transfusion 1 unit packed red cells on 1/12due to hemoglobin 7.0. Stool guaiac positive and black in color on admission.Hemoglobin 9.4 today 1/13. --Aspirin and heparin discontinued --Continue IV Protonix --Appreciate GI input  Hyperkalemia-resolved with dialysis --BMP daily to monitor  Generalized weakness-MRI brain was obtained on 1/5 and was negative for acute findings. --PT evaluation,SNF versus home with home health PT --OT recommending home health and 24-hour supervision/assistance  Recent COVID-19 infection-status post treatment with remdesivir and steroids.  Type 2 diabetes-  --Lantus on hold due to patient not eating  Vertigo --Trial of meclizine   DVT prophylaxis:SCDs Code Status: Full Code Family Communication:None at bedside Disposition Plan:Expect medically stable for discharge in 1 to 2 days,SNF versus home with home health (patient wants [facility]).  Patient continues to require hospital care due to new onset hypoxia, debilitating abdominal pain requiring further evaluation.   Consultants: Nephrology Gastroenterology   Procedures:  Peritoneal dialysis  Antimicrobials:   Rocephin and Flagyl-started 1/12,stop TBD   Objective: Vitals:   04/12/19 1441 04/12/19 1442 04/12/19 1443 04/12/19 1456  BP:    (!) 116/56  Pulse:    84  Resp:      Temp:    98.4 F (36.9 C)  TempSrc:    Oral  SpO2: (!) 88% 91% 91% 94%  Weight:      Height:        Intake/Output Summary (Last 24 hours) at 04/12/2019 1546 Last data filed at 04/12/2019 1030 Gross per 24 hour  Intake --  Output 75 ml  Net -75 ml   Filed Weights   04/09/19 0339 04/10/19 0500 04/11/19 0612  Weight: 115.1 kg 110.1 kg 113.7 kg    Examination:  General exam: awake, alert, no acute distress Respiratory system: clear to auscultation bilaterally, no wheezes, rales or rhonchi,  normal respiratory effort. Cardiovascular system: normal S1/S2, RRR, no pedal edema.   Gastrointestinal system: soft, diffusely tender worse across lower abdomen, non-distended, PD catheter in place Central nervous system: alert and oriented x4. no gross focal neurologic deficits, normal speech Extremities: moves all, no edema, normal tone Psychiatry: normal mood, congruent affect, judgement and insight appear normal    Data Reviewed: I have personally reviewed following labs and imaging studies  CBC: Recent Labs  Lab 04/08/19 0611 04/09/19 1144 04/10/19 0502 04/11/19 0444 04/12/19 1000  WBC 9.0 8.2 9.2 15.9* 18.5*  NEUTROABS  --   --   --   --  16.0*  HGB 8.7* 7.0* 9.4* 9.1* 9.0*  HCT 26.3* 21.5* 27.8* 27.7* 27.1*  MCV 67.8* 69.1* 68.3* 69.3* 69.3*  PLT 144* 131* 141* 147* 696   Basic Metabolic Panel: Recent Labs  Lab 04/07/19 0514 04/08/19 0611 04/09/19 1144 04/10/19 0502 04/11/19 0444  NA 130* 130* 133* 130* 131*  K 3.6 3.8 6.6* 4.1 3.9  CL 90* 93* 106 94* 96*  CO2 20* 21* 18* 22 20*  GLUCOSE 176* 164* 59* 152* 174*  BUN 117* 106* 82* 88* 81*  CREATININE 14.00* 12.94* 10.00* 11.04* 10.71*  CALCIUM 7.0* 6.9* 5.8* 7.2* 7.5*  PHOS 7.9*  --  4.7* 5.6* 5.4*   GFR: Estimated Creatinine Clearance: 8.2 mL/min (A) (by C-G formula based on SCr of 10.71 mg/dL (H)). Liver Function Tests: Recent Labs  Lab 04/07/19 0514 04/09/19 1144 04/10/19 0502 04/11/19 0444  ALBUMIN 1.7* 1.4* 1.6* 1.5*   No results for input(s): LIPASE, AMYLASE in the last 168 hours. No results for input(s): AMMONIA in the last 168 hours. Coagulation Profile: No  results for input(s): INR, PROTIME in the last 168 hours. Cardiac Enzymes: No results for input(s): CKTOTAL, CKMB, CKMBINDEX, TROPONINI in the last 168 hours. BNP (last 3 results) No results for input(s): PROBNP in the last 8760 hours. HbA1C: No results for input(s): HGBA1C in the last 72 hours. CBG: Recent Labs  Lab 04/11/19 1710  04/11/19 1726 04/11/19 2058 04/12/19 0738 04/12/19 1158  GLUCAP 183* 175* 165* 247* 215*   Lipid Profile: No results for input(s): CHOL, HDL, LDLCALC, TRIG, CHOLHDL, LDLDIRECT in the last 72 hours. Thyroid Function Tests: No results for input(s): TSH, T4TOTAL, FREET4, T3FREE, THYROIDAB in the last 72 hours. Anemia Panel: No results for input(s): VITAMINB12, FOLATE, FERRITIN, TIBC, IRON, RETICCTPCT in the last 72 hours. Sepsis Labs: No results for input(s): PROCALCITON, LATICACIDVEN in the last 168 hours.  Recent Results (from the past 240 hour(s))  Body fluid culture     Status: None   Collection Time: 04/06/19 11:55 AM   Specimen: Body Fluid  Result Value Ref Range Status   Specimen Description   Final    FLUID Performed at [facility], [place] [address], [place], [place] [address]    Special Requests   Final    PERITONEAL Performed at [facility], [place], [place] [address]    Gram Stain NO WBC SEEN NO ORGANISMS SEEN   Final   Culture   Final    NO GROWTH 3 DAYS Performed at [facility], [place] [address] [place], [place] [address]    Report Status 04/12/2019 FINAL  Final         Radiology Studies: No results found.      Scheduled Meds: . acetaminophen  650 mg Oral Once  . calcium acetate  1,334 mg Oral TID WC  . cholestyramine  4 g Oral BID  . dicyclomine  20 mg Oral TID AC & HS  . feeding supplement  1 Container Oral TID BM  . feeding supplement (PRO-STAT SUGAR FREE 64)  30 mL Oral TID  . gentamicin cream  1 application Topical Daily  . insulin aspart  0-5 Units Subcutaneous QHS  . insulin aspart  0-9 Units Subcutaneous TID WC  . meclizine  12.5 mg Oral TID  . multivitamin  1 tablet Oral QHS  . pantoprazole (PROTONIX) IV  40 mg Intravenous Q12H   Continuous Infusions: . sodium chloride    . dialysis solution 1.5% low-MG/low-CA       LOS: 8 days    Time spent: 40 minutes    Ezekiel Slocumb, DO [facility]   If 7PM-7AM, please contact night-coverage [email or web address] Password [facility] 04/12/2019, 3:46 PM

## 2019-04-12 NOTE — Progress Notes (Signed)
CPOX continued and nocturnal oximetry ordered by MD. RT was made aware by MD that nocturnal oximetry should be monitored tonight. Bedside RN was encouraged to follow with RT for the nocturnal oximetry. Pt plans to be discharged tomorrow once O2 needs/requirement  has been  addressed.

## 2019-04-13 LAB — CBC WITH DIFFERENTIAL/PLATELET
Abs Immature Granulocytes: 0.11 10*3/uL — ABNORMAL HIGH (ref 0.00–0.07)
Basophils Absolute: 0.1 10*3/uL (ref 0.0–0.1)
Basophils Relative: 0 %
Eosinophils Absolute: 0.3 10*3/uL (ref 0.0–0.5)
Eosinophils Relative: 2 %
HCT: 25.5 % — ABNORMAL LOW (ref 36.0–46.0)
Hemoglobin: 8.4 g/dL — ABNORMAL LOW (ref 12.0–15.0)
Immature Granulocytes: 1 %
Lymphocytes Relative: 7 %
Lymphs Abs: 1.2 10*3/uL (ref 0.7–4.0)
MCH: 22.8 pg — ABNORMAL LOW (ref 26.0–34.0)
MCHC: 32.9 g/dL (ref 30.0–36.0)
MCV: 69.3 fL — ABNORMAL LOW (ref 80.0–100.0)
Monocytes Absolute: 1 10*3/uL (ref 0.1–1.0)
Monocytes Relative: 6 %
Neutro Abs: 13.2 10*3/uL — ABNORMAL HIGH (ref 1.7–7.7)
Neutrophils Relative %: 84 %
Platelets: 174 10*3/uL (ref 150–400)
RBC: 3.68 MIL/uL — ABNORMAL LOW (ref 3.87–5.11)
RDW: 19.9 % — ABNORMAL HIGH (ref 11.5–15.5)
WBC: 15.8 10*3/uL — ABNORMAL HIGH (ref 4.0–10.5)
nRBC: 0 % (ref 0.0–0.2)

## 2019-04-13 LAB — GI PATHOGEN PANEL BY PCR, STOOL

## 2019-04-13 LAB — GLUCOSE, CAPILLARY
Glucose-Capillary: 174 mg/dL — ABNORMAL HIGH (ref 70–99)
Glucose-Capillary: 197 mg/dL — ABNORMAL HIGH (ref 70–99)

## 2019-04-13 LAB — COMPREHENSIVE METABOLIC PANEL
ALT: 6 U/L (ref 0–44)
AST: 7 U/L — ABNORMAL LOW (ref 15–41)
Albumin: 1.6 g/dL — ABNORMAL LOW (ref 3.5–5.0)
Alkaline Phosphatase: 66 U/L (ref 38–126)
Anion gap: 13 (ref 5–15)
BUN: 70 mg/dL — ABNORMAL HIGH (ref 6–20)
CO2: 22 mmol/L (ref 22–32)
Calcium: 7.6 mg/dL — ABNORMAL LOW (ref 8.9–10.3)
Chloride: 92 mmol/L — ABNORMAL LOW (ref 98–111)
Creatinine, Ser: 10 mg/dL — ABNORMAL HIGH (ref 0.44–1.00)
GFR calc Af Amer: 5 mL/min — ABNORMAL LOW (ref >60)
GFR calc non Af Amer: 4 mL/min — ABNORMAL LOW (ref >60)
Glucose, Bld: 186 mg/dL — ABNORMAL HIGH (ref 70–99)
Potassium: 3.3 mmol/L — ABNORMAL LOW (ref 3.5–5.1)
Sodium: 127 mmol/L — ABNORMAL LOW (ref 135–145)
Total Bilirubin: 0.6 mg/dL (ref 0.3–1.2)
Total Protein: 5 g/dL — ABNORMAL LOW (ref 6.5–8.1)

## 2019-04-13 MED ORDER — AMITRIPTYLINE HCL 25 MG PO TABS: 25.0000 mg | ORAL_TABLET | Freq: Every day | ORAL | 0 refills | Status: DC

## 2019-04-13 MED ORDER — RIFAXIMIN 550 MG PO TABS: 550.0000 mg | ORAL_TABLET | Freq: Two times a day (BID) | ORAL | 0 refills | Status: DC

## 2019-04-13 MED ORDER — POTASSIUM CHLORIDE CRYS ER 20 MEQ PO TBCR: 40.0000 meq | EXTENDED_RELEASE_TABLET | Freq: Every day | ORAL | 0 refills | Status: DC

## 2019-04-13 MED ORDER — HYOSCYAMINE SULFATE ER 0.375 MG PO TB12: 0.3750 mg | ORAL_TABLET | Freq: Two times a day (BID) | ORAL | 0 refills | Status: DC

## 2019-04-13 MED ORDER — SIMETHICONE 80 MG PO CHEW: 160.0000 mg | CHEWABLE_TABLET | Freq: Four times a day (QID) | ORAL | 0 refills | Status: DC | PRN

## 2019-04-13 MED ORDER — POTASSIUM CHLORIDE CRYS ER 20 MEQ PO TBCR
40.0000 meq | EXTENDED_RELEASE_TABLET | ORAL | Status: DC
  Administered 2019-04-13: 13:00:00 40 meq via ORAL
  Filled 2019-04-13: qty 2

## 2019-04-13 NOTE — Progress Notes (Signed)
Discharge teaching completed with patient who verbalized understanding of teaching, medications and follow up information.  Patient discharged in stable condition and with all belongings.

## 2019-04-13 NOTE — Progress Notes (Signed)
Overnight oximetry performed. Results placed in patient's paper chart. RN made aware.

## 2019-04-13 NOTE — Progress Notes (Signed)
Pd completed 

## 2019-04-13 NOTE — Plan of Care (Signed)
Continuing with plan of care. 

## 2019-04-13 NOTE — TOC Progression Note (Signed)
Transition of Care [facility]) - Progression Note    Patient Details  Name: Janet Mitchell MRN: [id number] Date of Birth: 1965/10/22  Transition of Care [facility]) CM/SW Contact  Shelbie Ammons, RN Phone Number: 04/13/2019, 10:51 AM  Clinical Narrative:  RNCM placed call to Brad with [employer] to update on overnight oximetry readings.      Expected Discharge Plan: [place] Barriers to Discharge: No Barriers Identified  Expected Discharge Plan and Services Expected Discharge Plan: [place]   Discharge Planning Services: CM Consult Post Acute Care Choice: Home Health   Expected Discharge Date: 04/11/19               DME Arranged: Wheelchair manual DME Agency: [employer] Date DME Agency Contacted: 04/05/19 Time DME Agency Contacted: 1200 Representative spoke with at DME Agency: [place]: PT, OT [place] Agency: [place] ([place]) Date [place]: 04/11/19 Time [place]: 1300 Representative spoke with at [place]: [place] ([place]) Interventions    Readmission Risk Interventions Readmission Risk Prevention Plan 04/05/2019  Transportation Screening Complete  Medication Review Press photographer) [place] Patient Refused  Some recent data might be hidden

## 2019-04-13 NOTE — Plan of Care (Signed)
Plan of care goals met 

## 2019-04-15 DIAGNOSIS — R197 Diarrhea, unspecified: Secondary | ICD-10-CM | POA: Diagnosis not present

## 2019-04-15 DIAGNOSIS — E119 Type 2 diabetes mellitus without complications: Secondary | ICD-10-CM | POA: Diagnosis not present

## 2019-04-15 DIAGNOSIS — Z7982 Long term (current) use of aspirin: Secondary | ICD-10-CM | POA: Diagnosis not present

## 2019-04-15 DIAGNOSIS — E875 Hyperkalemia: Secondary | ICD-10-CM | POA: Diagnosis not present

## 2019-04-15 DIAGNOSIS — E870 Hyperosmolality and hypernatremia: Secondary | ICD-10-CM | POA: Diagnosis not present

## 2019-04-15 DIAGNOSIS — U071 COVID-19: Secondary | ICD-10-CM | POA: Diagnosis not present

## 2019-04-15 DIAGNOSIS — K529 Noninfective gastroenteritis and colitis, unspecified: Secondary | ICD-10-CM | POA: Diagnosis not present

## 2019-04-15 DIAGNOSIS — Z9049 Acquired absence of other specified parts of digestive tract: Secondary | ICD-10-CM | POA: Diagnosis not present

## 2019-04-15 DIAGNOSIS — I132 Hypertensive heart and chronic kidney disease with heart failure and with stage 5 chronic kidney disease, or end stage renal disease: Secondary | ICD-10-CM | POA: Diagnosis not present

## 2019-04-15 DIAGNOSIS — D139 Benign neoplasm of ill-defined sites within the digestive system: Secondary | ICD-10-CM | POA: Diagnosis not present

## 2019-04-15 DIAGNOSIS — Z794 Long term (current) use of insulin: Secondary | ICD-10-CM | POA: Diagnosis not present

## 2019-04-15 DIAGNOSIS — Z992 Dependence on renal dialysis: Secondary | ICD-10-CM | POA: Diagnosis not present

## 2019-04-15 DIAGNOSIS — E1122 Type 2 diabetes mellitus with diabetic chronic kidney disease: Secondary | ICD-10-CM | POA: Diagnosis not present

## 2019-04-15 DIAGNOSIS — J9601 Acute respiratory failure with hypoxia: Secondary | ICD-10-CM | POA: Diagnosis not present

## 2019-04-15 DIAGNOSIS — D62 Acute posthemorrhagic anemia: Secondary | ICD-10-CM | POA: Diagnosis not present

## 2019-04-15 DIAGNOSIS — Z7722 Contact with and (suspected) exposure to environmental tobacco smoke (acute) (chronic): Secondary | ICD-10-CM | POA: Diagnosis not present

## 2019-04-15 DIAGNOSIS — N186 End stage renal disease: Secondary | ICD-10-CM | POA: Diagnosis not present

## 2019-04-15 DIAGNOSIS — D631 Anemia in chronic kidney disease: Secondary | ICD-10-CM | POA: Diagnosis not present

## 2019-04-15 DIAGNOSIS — H8110 Benign paroxysmal vertigo, unspecified ear: Secondary | ICD-10-CM | POA: Diagnosis not present

## 2019-04-15 DIAGNOSIS — I509 Heart failure, unspecified: Secondary | ICD-10-CM | POA: Diagnosis not present

## 2019-04-15 DIAGNOSIS — R519 Headache, unspecified: Secondary | ICD-10-CM | POA: Diagnosis not present

## 2019-04-15 DIAGNOSIS — J1282 Pneumonia due to coronavirus disease 2019: Secondary | ICD-10-CM | POA: Diagnosis not present

## 2019-04-16 LAB — COPPER, SERUM: Copper: 50 ug/dL — ABNORMAL LOW (ref 80–158)

## 2019-04-17 DIAGNOSIS — U071 COVID-19: Secondary | ICD-10-CM | POA: Diagnosis not present

## 2019-04-17 DIAGNOSIS — I132 Hypertensive heart and chronic kidney disease with heart failure and with stage 5 chronic kidney disease, or end stage renal disease: Secondary | ICD-10-CM | POA: Diagnosis not present

## 2019-04-17 DIAGNOSIS — J1282 Pneumonia due to coronavirus disease 2019: Secondary | ICD-10-CM | POA: Diagnosis not present

## 2019-04-17 DIAGNOSIS — J9601 Acute respiratory failure with hypoxia: Secondary | ICD-10-CM | POA: Diagnosis not present

## 2019-04-17 DIAGNOSIS — E1122 Type 2 diabetes mellitus with diabetic chronic kidney disease: Secondary | ICD-10-CM | POA: Diagnosis not present

## 2019-04-17 DIAGNOSIS — K529 Noninfective gastroenteritis and colitis, unspecified: Secondary | ICD-10-CM | POA: Diagnosis not present

## 2019-04-19 DIAGNOSIS — J9601 Acute respiratory failure with hypoxia: Secondary | ICD-10-CM | POA: Diagnosis not present

## 2019-04-19 DIAGNOSIS — I132 Hypertensive heart and chronic kidney disease with heart failure and with stage 5 chronic kidney disease, or end stage renal disease: Secondary | ICD-10-CM | POA: Diagnosis not present

## 2019-04-19 DIAGNOSIS — K529 Noninfective gastroenteritis and colitis, unspecified: Secondary | ICD-10-CM | POA: Diagnosis not present

## 2019-04-19 DIAGNOSIS — J1282 Pneumonia due to coronavirus disease 2019: Secondary | ICD-10-CM | POA: Diagnosis not present

## 2019-04-19 DIAGNOSIS — N186 End stage renal disease: Secondary | ICD-10-CM | POA: Diagnosis not present

## 2019-04-19 DIAGNOSIS — N2581 Secondary hyperparathyroidism of renal origin: Secondary | ICD-10-CM | POA: Diagnosis not present

## 2019-04-19 DIAGNOSIS — E1122 Type 2 diabetes mellitus with diabetic chronic kidney disease: Secondary | ICD-10-CM | POA: Diagnosis not present

## 2019-04-19 DIAGNOSIS — D631 Anemia in chronic kidney disease: Secondary | ICD-10-CM | POA: Diagnosis not present

## 2019-04-19 DIAGNOSIS — Z992 Dependence on renal dialysis: Secondary | ICD-10-CM | POA: Diagnosis not present

## 2019-04-19 DIAGNOSIS — U071 COVID-19: Secondary | ICD-10-CM | POA: Diagnosis not present

## 2019-04-19 DIAGNOSIS — D509 Iron deficiency anemia, unspecified: Secondary | ICD-10-CM | POA: Diagnosis not present

## 2019-04-20 DIAGNOSIS — D631 Anemia in chronic kidney disease: Secondary | ICD-10-CM | POA: Diagnosis not present

## 2019-04-20 DIAGNOSIS — Z992 Dependence on renal dialysis: Secondary | ICD-10-CM | POA: Diagnosis not present

## 2019-04-20 DIAGNOSIS — N186 End stage renal disease: Secondary | ICD-10-CM | POA: Diagnosis not present

## 2019-04-20 DIAGNOSIS — N2581 Secondary hyperparathyroidism of renal origin: Secondary | ICD-10-CM | POA: Diagnosis not present

## 2019-04-20 DIAGNOSIS — D509 Iron deficiency anemia, unspecified: Secondary | ICD-10-CM | POA: Diagnosis not present

## 2019-04-21 DIAGNOSIS — N186 End stage renal disease: Secondary | ICD-10-CM | POA: Diagnosis not present

## 2019-04-21 DIAGNOSIS — N2581 Secondary hyperparathyroidism of renal origin: Secondary | ICD-10-CM | POA: Diagnosis not present

## 2019-04-21 DIAGNOSIS — Z992 Dependence on renal dialysis: Secondary | ICD-10-CM | POA: Diagnosis not present

## 2019-04-21 DIAGNOSIS — D631 Anemia in chronic kidney disease: Secondary | ICD-10-CM | POA: Diagnosis not present

## 2019-04-21 DIAGNOSIS — D509 Iron deficiency anemia, unspecified: Secondary | ICD-10-CM | POA: Diagnosis not present

## 2019-04-22 ENCOUNTER — Other Ambulatory Visit: Payer: Self-pay

## 2019-04-22 DIAGNOSIS — K529 Noninfective gastroenteritis and colitis, unspecified: Secondary | ICD-10-CM | POA: Diagnosis not present

## 2019-04-22 DIAGNOSIS — U071 COVID-19: Secondary | ICD-10-CM | POA: Diagnosis not present

## 2019-04-22 DIAGNOSIS — D631 Anemia in chronic kidney disease: Secondary | ICD-10-CM | POA: Diagnosis not present

## 2019-04-22 DIAGNOSIS — D509 Iron deficiency anemia, unspecified: Secondary | ICD-10-CM | POA: Diagnosis not present

## 2019-04-22 DIAGNOSIS — I132 Hypertensive heart and chronic kidney disease with heart failure and with stage 5 chronic kidney disease, or end stage renal disease: Secondary | ICD-10-CM | POA: Diagnosis not present

## 2019-04-22 DIAGNOSIS — N2581 Secondary hyperparathyroidism of renal origin: Secondary | ICD-10-CM | POA: Diagnosis not present

## 2019-04-22 DIAGNOSIS — E119 Type 2 diabetes mellitus without complications: Secondary | ICD-10-CM | POA: Diagnosis not present

## 2019-04-22 DIAGNOSIS — J1282 Pneumonia due to coronavirus disease 2019: Secondary | ICD-10-CM | POA: Diagnosis not present

## 2019-04-22 DIAGNOSIS — N186 End stage renal disease: Secondary | ICD-10-CM | POA: Diagnosis not present

## 2019-04-22 DIAGNOSIS — E1122 Type 2 diabetes mellitus with diabetic chronic kidney disease: Secondary | ICD-10-CM | POA: Diagnosis not present

## 2019-04-22 DIAGNOSIS — Z1159 Encounter for screening for other viral diseases: Secondary | ICD-10-CM | POA: Diagnosis not present

## 2019-04-22 DIAGNOSIS — Z992 Dependence on renal dialysis: Secondary | ICD-10-CM | POA: Diagnosis not present

## 2019-04-22 DIAGNOSIS — J9601 Acute respiratory failure with hypoxia: Secondary | ICD-10-CM | POA: Diagnosis not present

## 2019-04-22 DIAGNOSIS — Z794 Long term (current) use of insulin: Secondary | ICD-10-CM | POA: Diagnosis not present

## 2019-04-23 DIAGNOSIS — J1282 Pneumonia due to coronavirus disease 2019: Secondary | ICD-10-CM | POA: Diagnosis not present

## 2019-04-23 DIAGNOSIS — Z992 Dependence on renal dialysis: Secondary | ICD-10-CM | POA: Diagnosis not present

## 2019-04-23 DIAGNOSIS — J9601 Acute respiratory failure with hypoxia: Secondary | ICD-10-CM | POA: Diagnosis not present

## 2019-04-23 DIAGNOSIS — N2581 Secondary hyperparathyroidism of renal origin: Secondary | ICD-10-CM | POA: Diagnosis not present

## 2019-04-23 DIAGNOSIS — K529 Noninfective gastroenteritis and colitis, unspecified: Secondary | ICD-10-CM | POA: Diagnosis not present

## 2019-04-23 DIAGNOSIS — I132 Hypertensive heart and chronic kidney disease with heart failure and with stage 5 chronic kidney disease, or end stage renal disease: Secondary | ICD-10-CM | POA: Diagnosis not present

## 2019-04-23 DIAGNOSIS — E1122 Type 2 diabetes mellitus with diabetic chronic kidney disease: Secondary | ICD-10-CM | POA: Diagnosis not present

## 2019-04-23 DIAGNOSIS — D631 Anemia in chronic kidney disease: Secondary | ICD-10-CM | POA: Diagnosis not present

## 2019-04-23 DIAGNOSIS — N186 End stage renal disease: Secondary | ICD-10-CM | POA: Diagnosis not present

## 2019-04-23 DIAGNOSIS — U071 COVID-19: Secondary | ICD-10-CM | POA: Diagnosis not present

## 2019-04-23 DIAGNOSIS — D509 Iron deficiency anemia, unspecified: Secondary | ICD-10-CM | POA: Diagnosis not present

## 2019-04-24 DIAGNOSIS — U071 COVID-19: Secondary | ICD-10-CM | POA: Diagnosis not present

## 2019-04-24 DIAGNOSIS — Z992 Dependence on renal dialysis: Secondary | ICD-10-CM | POA: Diagnosis not present

## 2019-04-24 DIAGNOSIS — D509 Iron deficiency anemia, unspecified: Secondary | ICD-10-CM | POA: Diagnosis not present

## 2019-04-24 DIAGNOSIS — N2581 Secondary hyperparathyroidism of renal origin: Secondary | ICD-10-CM | POA: Diagnosis not present

## 2019-04-24 DIAGNOSIS — J1282 Pneumonia due to coronavirus disease 2019: Secondary | ICD-10-CM | POA: Diagnosis not present

## 2019-04-24 DIAGNOSIS — I132 Hypertensive heart and chronic kidney disease with heart failure and with stage 5 chronic kidney disease, or end stage renal disease: Secondary | ICD-10-CM | POA: Diagnosis not present

## 2019-04-24 DIAGNOSIS — D631 Anemia in chronic kidney disease: Secondary | ICD-10-CM | POA: Diagnosis not present

## 2019-04-24 DIAGNOSIS — J9601 Acute respiratory failure with hypoxia: Secondary | ICD-10-CM | POA: Diagnosis not present

## 2019-04-24 DIAGNOSIS — N186 End stage renal disease: Secondary | ICD-10-CM | POA: Diagnosis not present

## 2019-04-24 DIAGNOSIS — E1122 Type 2 diabetes mellitus with diabetic chronic kidney disease: Secondary | ICD-10-CM | POA: Diagnosis not present

## 2019-04-24 DIAGNOSIS — K529 Noninfective gastroenteritis and colitis, unspecified: Secondary | ICD-10-CM | POA: Diagnosis not present

## 2019-04-25 DIAGNOSIS — N2581 Secondary hyperparathyroidism of renal origin: Secondary | ICD-10-CM | POA: Diagnosis not present

## 2019-04-25 DIAGNOSIS — D509 Iron deficiency anemia, unspecified: Secondary | ICD-10-CM | POA: Diagnosis not present

## 2019-04-25 DIAGNOSIS — D631 Anemia in chronic kidney disease: Secondary | ICD-10-CM | POA: Diagnosis not present

## 2019-04-25 DIAGNOSIS — N186 End stage renal disease: Secondary | ICD-10-CM | POA: Diagnosis not present

## 2019-04-25 DIAGNOSIS — Z992 Dependence on renal dialysis: Secondary | ICD-10-CM | POA: Diagnosis not present

## 2019-04-26 DIAGNOSIS — D509 Iron deficiency anemia, unspecified: Secondary | ICD-10-CM | POA: Diagnosis not present

## 2019-04-26 DIAGNOSIS — K529 Noninfective gastroenteritis and colitis, unspecified: Secondary | ICD-10-CM | POA: Diagnosis not present

## 2019-04-26 DIAGNOSIS — E1122 Type 2 diabetes mellitus with diabetic chronic kidney disease: Secondary | ICD-10-CM | POA: Diagnosis not present

## 2019-04-26 DIAGNOSIS — N186 End stage renal disease: Secondary | ICD-10-CM | POA: Diagnosis not present

## 2019-04-26 DIAGNOSIS — I132 Hypertensive heart and chronic kidney disease with heart failure and with stage 5 chronic kidney disease, or end stage renal disease: Secondary | ICD-10-CM | POA: Diagnosis not present

## 2019-04-26 DIAGNOSIS — J1282 Pneumonia due to coronavirus disease 2019: Secondary | ICD-10-CM | POA: Diagnosis not present

## 2019-04-26 DIAGNOSIS — Z992 Dependence on renal dialysis: Secondary | ICD-10-CM | POA: Diagnosis not present

## 2019-04-26 DIAGNOSIS — U071 COVID-19: Secondary | ICD-10-CM | POA: Diagnosis not present

## 2019-04-26 DIAGNOSIS — J9601 Acute respiratory failure with hypoxia: Secondary | ICD-10-CM | POA: Diagnosis not present

## 2019-04-26 DIAGNOSIS — N2581 Secondary hyperparathyroidism of renal origin: Secondary | ICD-10-CM | POA: Diagnosis not present

## 2019-04-26 DIAGNOSIS — D631 Anemia in chronic kidney disease: Secondary | ICD-10-CM | POA: Diagnosis not present

## 2019-04-27 DIAGNOSIS — D631 Anemia in chronic kidney disease: Secondary | ICD-10-CM | POA: Diagnosis not present

## 2019-04-27 DIAGNOSIS — D509 Iron deficiency anemia, unspecified: Secondary | ICD-10-CM | POA: Diagnosis not present

## 2019-04-27 DIAGNOSIS — N186 End stage renal disease: Secondary | ICD-10-CM | POA: Diagnosis not present

## 2019-04-27 DIAGNOSIS — Z992 Dependence on renal dialysis: Secondary | ICD-10-CM | POA: Diagnosis not present

## 2019-04-27 DIAGNOSIS — N2581 Secondary hyperparathyroidism of renal origin: Secondary | ICD-10-CM | POA: Diagnosis not present

## 2019-04-28 DIAGNOSIS — D509 Iron deficiency anemia, unspecified: Secondary | ICD-10-CM | POA: Diagnosis not present

## 2019-04-28 DIAGNOSIS — N186 End stage renal disease: Secondary | ICD-10-CM | POA: Diagnosis not present

## 2019-04-28 DIAGNOSIS — D631 Anemia in chronic kidney disease: Secondary | ICD-10-CM | POA: Diagnosis not present

## 2019-04-28 DIAGNOSIS — N2581 Secondary hyperparathyroidism of renal origin: Secondary | ICD-10-CM | POA: Diagnosis not present

## 2019-04-28 DIAGNOSIS — Z992 Dependence on renal dialysis: Secondary | ICD-10-CM | POA: Diagnosis not present

## 2019-04-29 DIAGNOSIS — Z992 Dependence on renal dialysis: Secondary | ICD-10-CM | POA: Diagnosis not present

## 2019-04-29 DIAGNOSIS — D631 Anemia in chronic kidney disease: Secondary | ICD-10-CM | POA: Diagnosis not present

## 2019-04-29 DIAGNOSIS — N186 End stage renal disease: Secondary | ICD-10-CM | POA: Diagnosis not present

## 2019-04-29 DIAGNOSIS — D509 Iron deficiency anemia, unspecified: Secondary | ICD-10-CM | POA: Diagnosis not present

## 2019-04-30 DIAGNOSIS — U071 COVID-19: Secondary | ICD-10-CM | POA: Diagnosis not present

## 2019-04-30 DIAGNOSIS — Z992 Dependence on renal dialysis: Secondary | ICD-10-CM | POA: Diagnosis not present

## 2019-04-30 DIAGNOSIS — J1282 Pneumonia due to coronavirus disease 2019: Secondary | ICD-10-CM | POA: Diagnosis not present

## 2019-04-30 DIAGNOSIS — K529 Noninfective gastroenteritis and colitis, unspecified: Secondary | ICD-10-CM | POA: Diagnosis not present

## 2019-04-30 DIAGNOSIS — N186 End stage renal disease: Secondary | ICD-10-CM | POA: Diagnosis not present

## 2019-04-30 DIAGNOSIS — D509 Iron deficiency anemia, unspecified: Secondary | ICD-10-CM | POA: Diagnosis not present

## 2019-04-30 DIAGNOSIS — E1122 Type 2 diabetes mellitus with diabetic chronic kidney disease: Secondary | ICD-10-CM | POA: Diagnosis not present

## 2019-04-30 DIAGNOSIS — D631 Anemia in chronic kidney disease: Secondary | ICD-10-CM | POA: Diagnosis not present

## 2019-04-30 DIAGNOSIS — J9601 Acute respiratory failure with hypoxia: Secondary | ICD-10-CM | POA: Diagnosis not present

## 2019-04-30 DIAGNOSIS — I132 Hypertensive heart and chronic kidney disease with heart failure and with stage 5 chronic kidney disease, or end stage renal disease: Secondary | ICD-10-CM | POA: Diagnosis not present

## 2019-05-01 ENCOUNTER — Ambulatory Visit (INDEPENDENT_AMBULATORY_CARE_PROVIDER_SITE_OTHER): Payer: Medicare Other | Admitting: Nurse Practitioner

## 2019-05-01 ENCOUNTER — Other Ambulatory Visit: Payer: Self-pay

## 2019-05-01 ENCOUNTER — Encounter: Payer: Self-pay | Admitting: Nurse Practitioner

## 2019-05-01 VITALS — BP 134/79 | HR 92 | Temp 96.9°F | Ht 68.0 in

## 2019-05-01 DIAGNOSIS — D509 Iron deficiency anemia, unspecified: Secondary | ICD-10-CM | POA: Diagnosis not present

## 2019-05-01 DIAGNOSIS — N186 End stage renal disease: Secondary | ICD-10-CM | POA: Diagnosis not present

## 2019-05-01 DIAGNOSIS — R1030 Lower abdominal pain, unspecified: Secondary | ICD-10-CM | POA: Diagnosis not present

## 2019-05-01 DIAGNOSIS — R197 Diarrhea, unspecified: Secondary | ICD-10-CM | POA: Diagnosis not present

## 2019-05-01 DIAGNOSIS — D631 Anemia in chronic kidney disease: Secondary | ICD-10-CM | POA: Diagnosis not present

## 2019-05-01 DIAGNOSIS — Z992 Dependence on renal dialysis: Secondary | ICD-10-CM | POA: Diagnosis not present

## 2019-05-01 NOTE — Assessment & Plan Note (Signed)
Diarrhea somewhat improved on current regimen.  Recommend she continue her current regimen as it seems to be working better.  She might be having some bowel irregularity related to her Covid as she seems to be having quite a few global symptoms related to coronavirus.  Probably will improve over the next couple months.  Follow-up in 6 months either in person or virtual.  Call for any problems before then.

## 2019-05-01 NOTE — Patient Instructions (Signed)
Your health issues we discussed today were:   Diarrhea: 1. Continue current medications for diarrhea 2. Call us if you have any worsening or severe symptoms 3. Hopefully her symptoms will improve over the next couple months related to COVID-19/coronavirus  Abdominal pain: 1. We will check the CT angiogram as recommended during her inpatient stay 2. This will allow [place] to evaluate the blood vessels feeding your intestines and colon to see if you are getting adequate blood flow 3. If there is poor blood flow this can cause abdominal pain. 4. Call us if you have any worsening or severe pain.  Overall I recommend:  1. Continue current medications 2. Return for follow-up in 6 months, either virtually or in person 3. Call us if you have any questions or concerns   ---------------------------------------------------------------  COVID-19 Vaccine Information can be found at: [email or web address].uk For questions related to vaccine distribution or appointments, please email vaccine@[email or web address] .[email or web address] or call [phone].   ---------------------------------------------------------------   At [facility] we value your feedback. You may receive a survey about your visit today. Please share your experience as we strive to create trusting relationships with our patients to provide genuine, compassionate, quality care.  We appreciate your understanding and patience as we review any laboratory studies, imaging, and other diagnostic tests that are ordered as we care for you. Our office policy is 5 business days for review of these results, and any emergent or urgent results are addressed in a timely manner for your best interest. If you do not hear from our office in 1 week, please contact us.   We also encourage the use of MyChart, which contains your medical information for your review as well. If you are not enrolled in this feature,  an access code is on this after visit summary for your convenience. Thank you for allowing [place] to be involved in your care.  It was great to see you today!  I hope you have a great day!!

## 2019-05-01 NOTE — Progress Notes (Signed)
Referring Provider: Lucianne Lei, MD Primary Care Physician:  Lucianne Lei, MD Primary GI:  Dr. Gala Romney  Chief Complaint  Patient presents with  . Abdominal Pain    Occasional burning and stabbing feeling in stomach after eating,constipation with soft stools,bloating    HPI:   Janet Mitchell is a 54 y.o. female who presents for follow-up on abdominal pain and diarrhea.  Medical history significant for GERD, ESRD on peritoneal dialysis, anemia, type 2 diabetes, CHF.  Status post cholecystectomy in 2019.  Pursuing a kidney transplant.  Initial trial of cholestyramine changed to colestipol due to possible renal impairment dosing.  Colonoscopy up-to-date 2020 with a 15 mm polyp found to be tubular adenoma and recommended 3-year repeat exam in 2023.  Trial of colestipol helped her diarrhea because abdominal cramping.  Imodium not very effective.  Bentyl not effective either.  At her last visit her abdominal pain was improved and not really a problem.  Still with diarrhea and indicates she is at the top of the kidney transplant list.  Work-up blood work related to kidney transplant looks good and she is hopeful.  She started to fundraiser "help open live" her post transplant rejection medications and doing well with that.  Bowel movement with every meal, typically twice a day with [place] 6-7 stools that look oily.  No other overt GI complaints.  Recommended we will discuss with kidney doctor possible medication options for diarrhea, call for any worsening or severe symptoms, follow-up in 2 months.  Discussion with nephrology indicated Levsin is safe with peritoneal dialysis and a prescription was sent to the pharmacy for 0.125 mg sublingual 3 times daily as needed diarrhea.  Unfortunately patient was admitted to the hospital 03/25/2019 through 03/30/2019.  At that time she notes she had not started Levsin as of yet.  Has been using Imodium without significant relief of diarrhea.  She was  unfortunately admitted with acute respiratory failure and hypoxia due to SARS COVID-19 pneumonia.  She was treated with remdesivir and Solu-Medrol.  Started on ceftriaxone empirically for superimposed pneumonia.  CRP improved, did not require oxygen.  She was discharged with recommendations for quarantine for 3 weeks until April 15, 2019.  Discharged on Decadron 6 mg p.o. daily for 5 days.  The patient is in the hospital 04/03/2019 for elevated BUN.  GI was consulted and the patient was started on cholestyramine, Levsin, rifaximin and trial of amitriptyline at bedtime.  H. pylori and celiac panel is negative.  CT of the abdomen and pelvis without contrast during admission found heavily calcified aorta and branch vessels inquiry mesenteric ischemia source of patient's postprandial abdominal pain.  Plan for CTA of the abdomen and pelvis but the patient is discharged course can be completed and recommended to follow-up outpatient for CTA.  Today she states she's doing ok overall. She's still having sharp/burning abdominal pain after eating. States she is still weak from COVID-19. Denies other abdominal pain, N/V, hematochezia, melena, fever, chills, unintentional weight loss. Diarrhea is improved, now it's intermittent on cholestyramine and Levbid. Denies further flu/cold symptoms. She was d/c from COVID-19 hospital stay on 03/30/2019 (about 1 month ago). Denies chest pain, dyspnea, dizziness, lightheadedness, syncope, near syncope. Denies any other upper or lower GI symptoms.  Past Medical History:  Diagnosis Date  . Anemia   . CHF (congestive heart failure) ([place])   . Chronic cholecystitis with calculus   . ESRD (end stage renal disease) on dialysis [facility])    "peritoneal dialysis q  hs" (03/09/2018)  . Headache    "a few/wk" (03/09/2018)  . History of blood transfusion 10/2017   "low blood count" (03/09/2018)  . Hypertension   . Spinal headache   . Type II diabetes mellitus ([place])     Past Surgical  History:  Procedure Laterality Date  . AMPUTATION TOE Left 2013   Great toe  . BIOPSY  10/24/2018   Procedure: BIOPSY;  Surgeon: Daneil Dolin, MD;  [facility];  Service: Endoscopy;;  right and left colon  . CATARACT EXTRACTION W/ INTRAOCULAR LENS IMPLANT Right   . CESAREAN SECTION  1994; 1998  . CHOLECYSTECTOMY N/A 03/09/2018   Procedure: LAPAROSCOPIC CHOLECYSTECTOMY WITH INTRAOPERATIVE CHOLANGIOGRAM ERAS PATHWAY;  Surgeon: Donnie Mesa, MD;  Location: [place];  Service: General;  Laterality: N/A;  . COLONOSCOPY N/A 10/24/2018   Procedure: COLONOSCOPY;  Surgeon: Daneil Dolin, MD;  [facility];  Service: Endoscopy;  Laterality: N/A;  1:00pm  . EYE SURGERY  05/15/2018   Removal of blood in the globe (due to DM)  . FLEXIBLE SIGMOIDOSCOPY N/A 11/24/2017   Procedure: FLEXIBLE SIGMOIDOSCOPY;  Surgeon: Daneil Dolin, MD;  [facility];  Service: Endoscopy;  Laterality: N/A;  . LAPAROSCOPIC CHOLECYSTECTOMY  03/09/2018  . PERITONEAL CATHETER INSERTION  2017  . POLYPECTOMY  10/24/2018   Procedure: POLYPECTOMY;  Surgeon: Daneil Dolin, MD;  [facility];  Service: Endoscopy;;  . TOTAL HIP ARTHROPLASTY Left 1997    Current Outpatient Medications  Medication Sig Dispense Refill  . ALPRAZolam (XANAX) 0.5 MG tablet Take 0.5 mg by mouth at bedtime as needed for anxiety.    Marland Kitchen aspirin EC 81 MG tablet Take 81 mg by mouth daily.    . Blood Glucose Monitoring Suppl (ACCU-CHEK GUIDE) w/Device KIT 1 Piece by Does not apply route as directed. 1 kit 0  . calcitRIOL (ROCALTROL) 0.5 MCG capsule Take 0.5-1 mcg by mouth See admin instructions. 0.6mg on Mondays through Fridays and take 122m on Saturdays and Sundays    . calcium acetate (PHOSLO) 667 MG capsule Take 2 capsules (1,334 mg total) by mouth 3 (three) times daily with meals. 90 capsule 0  . cholestyramine (QUESTRAN) 4 g packet Take 1 packet (4 g total) by mouth 2 (two) times daily. 60 each 1  . glucose  blood (ACCU-CHEK GUIDE) test strip Use as instructed 150 each 2  . hyoscyamine (LEVBID) 0.375 MG 12 hr tablet Take 1 tablet (0.375 mg total) by mouth every 12 (twelve) hours. 60 tablet 0  . Insulin Glargine, 2 Unit Dial, 300 UNIT/ML SOPN Inject 20 Units into the skin at bedtime. 3 pen 2  . Ipratropium-Albuterol (COMBIVENT) 20-100 MCG/ACT AERS respimat Inhale 2 puffs into the lungs every 6 (six) hours as needed for wheezing or shortness of breath. 4 g 0  . multivitamin (RENA-VIT) TABS tablet Take 1 tablet by mouth at bedtime. 30 tablet 0  . pantoprazole (PROTONIX) 40 MG tablet Take 1 tablet (40 mg total) by mouth daily. 30 tablet 0  . potassium chloride SA (KLOR-CON) 20 MEQ tablet Take 2 tablets (40 mEq total) by mouth daily for 3 days. (Patient not taking: Reported on 05/01/2019) 6 tablet 0   No current facility-administered medications for this visit.    Allergies as of 05/01/2019  . (No Known Allergies)    Family History  Problem Relation Age of Onset  . Heart disease Mother   . Thrombocytopenia Mother  TTP  . Heart failure Father   . Kidney disease Paternal Grandfather   . Colon cancer Neg Hx     Social History   Socioeconomic History  . Marital status: Married    Spouse name: Not on file  . Number of children: 2  . Years of education: Not on file  . Highest education level: Not on file  Occupational History  . Not on file  Tobacco Use  . Smoking status: Passive Smoke Exposure - Never Smoker  . Smokeless tobacco: Never Used  Substance and Sexual Activity  . Alcohol use: Never  . Drug use: Never  . Sexual activity: Yes    Birth control/protection: None  Other Topics Concern  . Not on file  Social History Narrative  . Not on file   Social Determinants of Health   Financial Resource Strain:   . Difficulty of Paying Living Expenses: Not on file  Food Insecurity:   . Worried About Charity fundraiser in the Last Year: Not on file  . Ran Out of Food in the Last  Year: Not on file  Transportation Needs:   . Lack of Transportation (Medical): Not on file  . Lack of Transportation (Non-Medical): Not on file  Physical Activity:   . Days of Exercise per Week: Not on file  . Minutes of Exercise per Session: Not on file  Stress:   . Feeling of Stress : Not on file  Social Connections:   . Frequency of Communication with Friends and Family: Not on file  . Frequency of Social Gatherings with Friends and Family: Not on file  . Attends Religious Services: Not on file  . Active Member of Clubs or Organizations: Not on file  . Attends Archivist Meetings: Not on file  . Marital Status: Not on file    Review of Systems: General: Negative for anorexia, weight loss, fever, chills. ENT: Negative for hoarseness, difficulty swallowing. CV: Negative for chest pain, angina, palpitations, peripheral edema.  Respiratory: Negative for dyspnea at rest, cough, sputum, wheezing.  GI: See history of present illness. Endo: Negative for unusual weight change.  Heme: Negative for bruising or bleeding. Allergy: Negative for rash or hives.   Physical Exam: BP 134/79   Pulse 92   Temp (!) 96.9 F (36.1 C) (Temporal)   Ht '5\' 8"'  (1.727 m)   BMI 38.12 kg/m  General:   Alert and oriented. Pleasant and cooperative. Well-nourished and well-developed. In a wheelchair. Eyes:  Without icterus, sclera clear and conjunctiva pink.  Ears:  Normal auditory acuity. Cardiovascular:  S1, S2 present without murmurs appreciated. Extremities without clubbing or edema. Respiratory:  Clear to auscultation bilaterally. No wheezes, rales, or rhonchi. No distress.  Gastrointestinal:  +BS, soft, non-tender and non-distended. No HSM noted. No guarding or rebound. No masses appreciated.  Rectal:  Deferred  Musculoskalatal:  Symmetrical without gross deformities. Neurologic:  Alert and oriented x4;  grossly normal neurologically. Psych:  Alert and cooperative. Normal mood and  affect. Heme/Lymph/Immune: No excessive bruising noted.    05/01/2019 2:40 PM   Disclaimer: This note was dictated with voice recognition software. Similar sounding words can inadvertently be transcribed and may not be corrected upon review.

## 2019-05-01 NOTE — Progress Notes (Signed)
Cc'ed to pcp °

## 2019-05-01 NOTE — Assessment & Plan Note (Signed)
The patient has been having worsening postprandial abdominal pain described as sharp/burning.  CT during hospital admission found heavy calcification of aorta and branch vessels.  Query chronic mesenteric ischemia and recommended CTA.  This is not completed as an inpatient and recommended completion as an outpatient.  We will check CTA at this time.  Radiology and CT stop labs to decide about her hemoglobin and on dialysis whether they can do CTA with contrast.  Her abdominal pain is about baseline.  Does not appear significant and she is not in any distress today.  Follow-up in 6 months.  Call for any worsening or severe problems before then.

## 2019-05-02 DIAGNOSIS — D509 Iron deficiency anemia, unspecified: Secondary | ICD-10-CM | POA: Diagnosis not present

## 2019-05-02 DIAGNOSIS — U071 COVID-19: Secondary | ICD-10-CM | POA: Diagnosis not present

## 2019-05-02 DIAGNOSIS — Z992 Dependence on renal dialysis: Secondary | ICD-10-CM | POA: Diagnosis not present

## 2019-05-02 DIAGNOSIS — D631 Anemia in chronic kidney disease: Secondary | ICD-10-CM | POA: Diagnosis not present

## 2019-05-02 DIAGNOSIS — J1282 Pneumonia due to coronavirus disease 2019: Secondary | ICD-10-CM | POA: Diagnosis not present

## 2019-05-02 DIAGNOSIS — E1122 Type 2 diabetes mellitus with diabetic chronic kidney disease: Secondary | ICD-10-CM | POA: Diagnosis not present

## 2019-05-02 DIAGNOSIS — N186 End stage renal disease: Secondary | ICD-10-CM | POA: Diagnosis not present

## 2019-05-02 DIAGNOSIS — J9601 Acute respiratory failure with hypoxia: Secondary | ICD-10-CM | POA: Diagnosis not present

## 2019-05-02 DIAGNOSIS — K529 Noninfective gastroenteritis and colitis, unspecified: Secondary | ICD-10-CM | POA: Diagnosis not present

## 2019-05-02 DIAGNOSIS — I132 Hypertensive heart and chronic kidney disease with heart failure and with stage 5 chronic kidney disease, or end stage renal disease: Secondary | ICD-10-CM | POA: Diagnosis not present

## 2019-05-03 DIAGNOSIS — D631 Anemia in chronic kidney disease: Secondary | ICD-10-CM | POA: Diagnosis not present

## 2019-05-03 DIAGNOSIS — D509 Iron deficiency anemia, unspecified: Secondary | ICD-10-CM | POA: Diagnosis not present

## 2019-05-03 DIAGNOSIS — K529 Noninfective gastroenteritis and colitis, unspecified: Secondary | ICD-10-CM | POA: Diagnosis not present

## 2019-05-03 DIAGNOSIS — I132 Hypertensive heart and chronic kidney disease with heart failure and with stage 5 chronic kidney disease, or end stage renal disease: Secondary | ICD-10-CM | POA: Diagnosis not present

## 2019-05-03 DIAGNOSIS — Z992 Dependence on renal dialysis: Secondary | ICD-10-CM | POA: Diagnosis not present

## 2019-05-03 DIAGNOSIS — E1122 Type 2 diabetes mellitus with diabetic chronic kidney disease: Secondary | ICD-10-CM | POA: Diagnosis not present

## 2019-05-03 DIAGNOSIS — J1282 Pneumonia due to coronavirus disease 2019: Secondary | ICD-10-CM | POA: Diagnosis not present

## 2019-05-03 DIAGNOSIS — N186 End stage renal disease: Secondary | ICD-10-CM | POA: Diagnosis not present

## 2019-05-03 DIAGNOSIS — U071 COVID-19: Secondary | ICD-10-CM | POA: Diagnosis not present

## 2019-05-03 DIAGNOSIS — J9601 Acute respiratory failure with hypoxia: Secondary | ICD-10-CM | POA: Diagnosis not present

## 2019-05-04 DIAGNOSIS — Z992 Dependence on renal dialysis: Secondary | ICD-10-CM | POA: Diagnosis not present

## 2019-05-04 DIAGNOSIS — N186 End stage renal disease: Secondary | ICD-10-CM | POA: Diagnosis not present

## 2019-05-04 DIAGNOSIS — D631 Anemia in chronic kidney disease: Secondary | ICD-10-CM | POA: Diagnosis not present

## 2019-05-04 DIAGNOSIS — D509 Iron deficiency anemia, unspecified: Secondary | ICD-10-CM | POA: Diagnosis not present

## 2019-05-05 DIAGNOSIS — N186 End stage renal disease: Secondary | ICD-10-CM | POA: Diagnosis not present

## 2019-05-05 DIAGNOSIS — D509 Iron deficiency anemia, unspecified: Secondary | ICD-10-CM | POA: Diagnosis not present

## 2019-05-05 DIAGNOSIS — Z992 Dependence on renal dialysis: Secondary | ICD-10-CM | POA: Diagnosis not present

## 2019-05-05 DIAGNOSIS — D631 Anemia in chronic kidney disease: Secondary | ICD-10-CM | POA: Diagnosis not present

## 2019-05-06 DIAGNOSIS — N186 End stage renal disease: Secondary | ICD-10-CM | POA: Diagnosis not present

## 2019-05-06 DIAGNOSIS — Z992 Dependence on renal dialysis: Secondary | ICD-10-CM | POA: Diagnosis not present

## 2019-05-06 DIAGNOSIS — D509 Iron deficiency anemia, unspecified: Secondary | ICD-10-CM | POA: Diagnosis not present

## 2019-05-06 DIAGNOSIS — D631 Anemia in chronic kidney disease: Secondary | ICD-10-CM | POA: Diagnosis not present

## 2019-05-07 DIAGNOSIS — J9601 Acute respiratory failure with hypoxia: Secondary | ICD-10-CM | POA: Diagnosis not present

## 2019-05-07 DIAGNOSIS — Z992 Dependence on renal dialysis: Secondary | ICD-10-CM | POA: Diagnosis not present

## 2019-05-07 DIAGNOSIS — J1282 Pneumonia due to coronavirus disease 2019: Secondary | ICD-10-CM | POA: Diagnosis not present

## 2019-05-07 DIAGNOSIS — U071 COVID-19: Secondary | ICD-10-CM | POA: Diagnosis not present

## 2019-05-07 DIAGNOSIS — K529 Noninfective gastroenteritis and colitis, unspecified: Secondary | ICD-10-CM | POA: Diagnosis not present

## 2019-05-07 DIAGNOSIS — I132 Hypertensive heart and chronic kidney disease with heart failure and with stage 5 chronic kidney disease, or end stage renal disease: Secondary | ICD-10-CM | POA: Diagnosis not present

## 2019-05-07 DIAGNOSIS — D631 Anemia in chronic kidney disease: Secondary | ICD-10-CM | POA: Diagnosis not present

## 2019-05-07 DIAGNOSIS — D509 Iron deficiency anemia, unspecified: Secondary | ICD-10-CM | POA: Diagnosis not present

## 2019-05-07 DIAGNOSIS — N186 End stage renal disease: Secondary | ICD-10-CM | POA: Diagnosis not present

## 2019-05-07 DIAGNOSIS — E1122 Type 2 diabetes mellitus with diabetic chronic kidney disease: Secondary | ICD-10-CM | POA: Diagnosis not present

## 2019-05-08 DIAGNOSIS — D631 Anemia in chronic kidney disease: Secondary | ICD-10-CM | POA: Diagnosis not present

## 2019-05-08 DIAGNOSIS — N186 End stage renal disease: Secondary | ICD-10-CM | POA: Diagnosis not present

## 2019-05-08 DIAGNOSIS — D509 Iron deficiency anemia, unspecified: Secondary | ICD-10-CM | POA: Diagnosis not present

## 2019-05-08 DIAGNOSIS — Z992 Dependence on renal dialysis: Secondary | ICD-10-CM | POA: Diagnosis not present

## 2019-05-09 DIAGNOSIS — D631 Anemia in chronic kidney disease: Secondary | ICD-10-CM | POA: Diagnosis not present

## 2019-05-09 DIAGNOSIS — Z992 Dependence on renal dialysis: Secondary | ICD-10-CM | POA: Diagnosis not present

## 2019-05-09 DIAGNOSIS — N186 End stage renal disease: Secondary | ICD-10-CM | POA: Diagnosis not present

## 2019-05-09 DIAGNOSIS — D509 Iron deficiency anemia, unspecified: Secondary | ICD-10-CM | POA: Diagnosis not present

## 2019-05-10 ENCOUNTER — Other Ambulatory Visit: Payer: Medicare Other

## 2019-05-10 DIAGNOSIS — J9601 Acute respiratory failure with hypoxia: Secondary | ICD-10-CM | POA: Diagnosis not present

## 2019-05-10 DIAGNOSIS — K529 Noninfective gastroenteritis and colitis, unspecified: Secondary | ICD-10-CM | POA: Diagnosis not present

## 2019-05-10 DIAGNOSIS — U071 COVID-19: Secondary | ICD-10-CM | POA: Diagnosis not present

## 2019-05-10 DIAGNOSIS — D631 Anemia in chronic kidney disease: Secondary | ICD-10-CM | POA: Diagnosis not present

## 2019-05-10 DIAGNOSIS — I132 Hypertensive heart and chronic kidney disease with heart failure and with stage 5 chronic kidney disease, or end stage renal disease: Secondary | ICD-10-CM | POA: Diagnosis not present

## 2019-05-10 DIAGNOSIS — N186 End stage renal disease: Secondary | ICD-10-CM | POA: Diagnosis not present

## 2019-05-10 DIAGNOSIS — Z992 Dependence on renal dialysis: Secondary | ICD-10-CM | POA: Diagnosis not present

## 2019-05-10 DIAGNOSIS — D509 Iron deficiency anemia, unspecified: Secondary | ICD-10-CM | POA: Diagnosis not present

## 2019-05-10 DIAGNOSIS — E1122 Type 2 diabetes mellitus with diabetic chronic kidney disease: Secondary | ICD-10-CM | POA: Diagnosis not present

## 2019-05-10 DIAGNOSIS — J1282 Pneumonia due to coronavirus disease 2019: Secondary | ICD-10-CM | POA: Diagnosis not present

## 2019-05-11 DIAGNOSIS — D509 Iron deficiency anemia, unspecified: Secondary | ICD-10-CM | POA: Diagnosis not present

## 2019-05-11 DIAGNOSIS — Z992 Dependence on renal dialysis: Secondary | ICD-10-CM | POA: Diagnosis not present

## 2019-05-11 DIAGNOSIS — N186 End stage renal disease: Secondary | ICD-10-CM | POA: Diagnosis not present

## 2019-05-11 DIAGNOSIS — D631 Anemia in chronic kidney disease: Secondary | ICD-10-CM | POA: Diagnosis not present

## 2019-05-12 DIAGNOSIS — D509 Iron deficiency anemia, unspecified: Secondary | ICD-10-CM | POA: Diagnosis not present

## 2019-05-12 DIAGNOSIS — D631 Anemia in chronic kidney disease: Secondary | ICD-10-CM | POA: Diagnosis not present

## 2019-05-12 DIAGNOSIS — N186 End stage renal disease: Secondary | ICD-10-CM | POA: Diagnosis not present

## 2019-05-12 DIAGNOSIS — Z992 Dependence on renal dialysis: Secondary | ICD-10-CM | POA: Diagnosis not present

## 2019-05-13 ENCOUNTER — Telehealth: Payer: Self-pay | Admitting: Internal Medicine

## 2019-05-13 DIAGNOSIS — D631 Anemia in chronic kidney disease: Secondary | ICD-10-CM | POA: Diagnosis not present

## 2019-05-13 DIAGNOSIS — D509 Iron deficiency anemia, unspecified: Secondary | ICD-10-CM | POA: Diagnosis not present

## 2019-05-13 DIAGNOSIS — Z992 Dependence on renal dialysis: Secondary | ICD-10-CM | POA: Diagnosis not present

## 2019-05-13 DIAGNOSIS — N186 End stage renal disease: Secondary | ICD-10-CM | POA: Diagnosis not present

## 2019-05-13 NOTE — Telephone Encounter (Signed)
Spoke with pt. Pt is aware of worsening symptoms that need to be monitored. If pts symptoms worsen, she is to go to the ED.  MB- please arrange CTA,

## 2019-05-13 NOTE — Telephone Encounter (Signed)
Spoke with pt. C/o blood in stool this weekend. Pt continues to have diarrhea and abdominal pain daily. When pt eats, she immediately has to have a bowel movement. Pt states that her lower abdomen will hurt and burn as the bowel movement is coming out. Pt couldn't give an exact amount out times she uses the bathroom daily. She says it's several bowel movements throughout the day. Pt noticed her BM was bright red on Friday and Saturday. Pt took Pepto bismol on Thursday. Pt's BM color is light orange as of this morning. Pt reports no nausea, no vomiting, no SOB, no feeling lightheaded. Pt was advised to go to the ED,  if she has any of the above symptoms. Please advise in the absence of EG.

## 2019-05-13 NOTE — Telephone Encounter (Signed)
[phone] PLEASE CALL PATIENT, STATES THAT THIS WEEKEND SHE HAD A LOT OF BLOOD WHEN SHE USED THE RESTROOM

## 2019-05-13 NOTE — Telephone Encounter (Signed)
Monitor for any further rectal bleeding, worsening abdominal pain, and diarrhea. If so, needs to go to ED.   Needs to have CTA as previously ordered.Is this scheduled yet?  Concern for chronic mesenteric ischemia. If she does have significant rectal bleeding, fatigue, dizziness, abdominal pain, etc., needs to go to ED.

## 2019-05-14 DIAGNOSIS — U071 COVID-19: Secondary | ICD-10-CM | POA: Diagnosis not present

## 2019-05-14 DIAGNOSIS — D631 Anemia in chronic kidney disease: Secondary | ICD-10-CM | POA: Diagnosis not present

## 2019-05-14 DIAGNOSIS — E1122 Type 2 diabetes mellitus with diabetic chronic kidney disease: Secondary | ICD-10-CM | POA: Diagnosis not present

## 2019-05-14 DIAGNOSIS — D509 Iron deficiency anemia, unspecified: Secondary | ICD-10-CM | POA: Diagnosis not present

## 2019-05-14 DIAGNOSIS — N186 End stage renal disease: Secondary | ICD-10-CM | POA: Diagnosis not present

## 2019-05-14 DIAGNOSIS — J1282 Pneumonia due to coronavirus disease 2019: Secondary | ICD-10-CM | POA: Diagnosis not present

## 2019-05-14 DIAGNOSIS — I132 Hypertensive heart and chronic kidney disease with heart failure and with stage 5 chronic kidney disease, or end stage renal disease: Secondary | ICD-10-CM | POA: Diagnosis not present

## 2019-05-14 DIAGNOSIS — J9601 Acute respiratory failure with hypoxia: Secondary | ICD-10-CM | POA: Diagnosis not present

## 2019-05-14 DIAGNOSIS — Z992 Dependence on renal dialysis: Secondary | ICD-10-CM | POA: Diagnosis not present

## 2019-05-14 DIAGNOSIS — K529 Noninfective gastroenteritis and colitis, unspecified: Secondary | ICD-10-CM | POA: Diagnosis not present

## 2019-05-14 NOTE — Telephone Encounter (Signed)
CTA scheduled for 05/27/19 at 4:00pm, arrive at 3:45pm. Liquids only for 4 hours prior to test.  Called and informed pt of appt. Letter mailed.  No PA needed per [employer].

## 2019-05-15 DIAGNOSIS — E870 Hyperosmolality and hypernatremia: Secondary | ICD-10-CM | POA: Diagnosis not present

## 2019-05-15 DIAGNOSIS — D139 Benign neoplasm of ill-defined sites within the digestive system: Secondary | ICD-10-CM | POA: Diagnosis not present

## 2019-05-15 DIAGNOSIS — N186 End stage renal disease: Secondary | ICD-10-CM | POA: Diagnosis not present

## 2019-05-15 DIAGNOSIS — D631 Anemia in chronic kidney disease: Secondary | ICD-10-CM | POA: Diagnosis not present

## 2019-05-15 DIAGNOSIS — Z7982 Long term (current) use of aspirin: Secondary | ICD-10-CM | POA: Diagnosis not present

## 2019-05-15 DIAGNOSIS — I132 Hypertensive heart and chronic kidney disease with heart failure and with stage 5 chronic kidney disease, or end stage renal disease: Secondary | ICD-10-CM | POA: Diagnosis not present

## 2019-05-15 DIAGNOSIS — I509 Heart failure, unspecified: Secondary | ICD-10-CM | POA: Diagnosis not present

## 2019-05-15 DIAGNOSIS — D62 Acute posthemorrhagic anemia: Secondary | ICD-10-CM | POA: Diagnosis not present

## 2019-05-15 DIAGNOSIS — J1282 Pneumonia due to coronavirus disease 2019: Secondary | ICD-10-CM | POA: Diagnosis not present

## 2019-05-15 DIAGNOSIS — Z992 Dependence on renal dialysis: Secondary | ICD-10-CM | POA: Diagnosis not present

## 2019-05-15 DIAGNOSIS — Z7722 Contact with and (suspected) exposure to environmental tobacco smoke (acute) (chronic): Secondary | ICD-10-CM | POA: Diagnosis not present

## 2019-05-15 DIAGNOSIS — U071 COVID-19: Secondary | ICD-10-CM | POA: Diagnosis not present

## 2019-05-15 DIAGNOSIS — E875 Hyperkalemia: Secondary | ICD-10-CM | POA: Diagnosis not present

## 2019-05-15 DIAGNOSIS — E1122 Type 2 diabetes mellitus with diabetic chronic kidney disease: Secondary | ICD-10-CM | POA: Diagnosis not present

## 2019-05-15 DIAGNOSIS — R519 Headache, unspecified: Secondary | ICD-10-CM | POA: Diagnosis not present

## 2019-05-15 DIAGNOSIS — Z794 Long term (current) use of insulin: Secondary | ICD-10-CM | POA: Diagnosis not present

## 2019-05-15 DIAGNOSIS — J9601 Acute respiratory failure with hypoxia: Secondary | ICD-10-CM | POA: Diagnosis not present

## 2019-05-15 DIAGNOSIS — D509 Iron deficiency anemia, unspecified: Secondary | ICD-10-CM | POA: Diagnosis not present

## 2019-05-15 DIAGNOSIS — R197 Diarrhea, unspecified: Secondary | ICD-10-CM | POA: Diagnosis not present

## 2019-05-15 DIAGNOSIS — H8110 Benign paroxysmal vertigo, unspecified ear: Secondary | ICD-10-CM | POA: Diagnosis not present

## 2019-05-15 DIAGNOSIS — K529 Noninfective gastroenteritis and colitis, unspecified: Secondary | ICD-10-CM | POA: Diagnosis not present

## 2019-05-15 DIAGNOSIS — Z9049 Acquired absence of other specified parts of digestive tract: Secondary | ICD-10-CM | POA: Diagnosis not present

## 2019-05-16 DIAGNOSIS — N186 End stage renal disease: Secondary | ICD-10-CM | POA: Diagnosis not present

## 2019-05-16 DIAGNOSIS — D509 Iron deficiency anemia, unspecified: Secondary | ICD-10-CM | POA: Diagnosis not present

## 2019-05-16 DIAGNOSIS — Z992 Dependence on renal dialysis: Secondary | ICD-10-CM | POA: Diagnosis not present

## 2019-05-16 DIAGNOSIS — D631 Anemia in chronic kidney disease: Secondary | ICD-10-CM | POA: Diagnosis not present

## 2019-05-17 DIAGNOSIS — D631 Anemia in chronic kidney disease: Secondary | ICD-10-CM | POA: Diagnosis not present

## 2019-05-17 DIAGNOSIS — D509 Iron deficiency anemia, unspecified: Secondary | ICD-10-CM | POA: Diagnosis not present

## 2019-05-17 DIAGNOSIS — N186 End stage renal disease: Secondary | ICD-10-CM | POA: Diagnosis not present

## 2019-05-17 DIAGNOSIS — Z992 Dependence on renal dialysis: Secondary | ICD-10-CM | POA: Diagnosis not present

## 2019-05-18 DIAGNOSIS — D509 Iron deficiency anemia, unspecified: Secondary | ICD-10-CM | POA: Diagnosis not present

## 2019-05-18 DIAGNOSIS — Z992 Dependence on renal dialysis: Secondary | ICD-10-CM | POA: Diagnosis not present

## 2019-05-18 DIAGNOSIS — N186 End stage renal disease: Secondary | ICD-10-CM | POA: Diagnosis not present

## 2019-05-18 DIAGNOSIS — D631 Anemia in chronic kidney disease: Secondary | ICD-10-CM | POA: Diagnosis not present

## 2019-05-19 DIAGNOSIS — Z992 Dependence on renal dialysis: Secondary | ICD-10-CM | POA: Diagnosis not present

## 2019-05-19 DIAGNOSIS — N186 End stage renal disease: Secondary | ICD-10-CM | POA: Diagnosis not present

## 2019-05-19 DIAGNOSIS — D631 Anemia in chronic kidney disease: Secondary | ICD-10-CM | POA: Diagnosis not present

## 2019-05-19 DIAGNOSIS — D509 Iron deficiency anemia, unspecified: Secondary | ICD-10-CM | POA: Diagnosis not present

## 2019-05-20 DIAGNOSIS — N186 End stage renal disease: Secondary | ICD-10-CM | POA: Diagnosis not present

## 2019-05-20 DIAGNOSIS — D509 Iron deficiency anemia, unspecified: Secondary | ICD-10-CM | POA: Diagnosis not present

## 2019-05-20 DIAGNOSIS — Z992 Dependence on renal dialysis: Secondary | ICD-10-CM | POA: Diagnosis not present

## 2019-05-20 DIAGNOSIS — D631 Anemia in chronic kidney disease: Secondary | ICD-10-CM | POA: Diagnosis not present

## 2019-05-20 DIAGNOSIS — M25571 Pain in right ankle and joints of right foot: Secondary | ICD-10-CM | POA: Diagnosis not present

## 2019-05-21 DIAGNOSIS — K529 Noninfective gastroenteritis and colitis, unspecified: Secondary | ICD-10-CM | POA: Diagnosis not present

## 2019-05-21 DIAGNOSIS — D631 Anemia in chronic kidney disease: Secondary | ICD-10-CM | POA: Diagnosis not present

## 2019-05-21 DIAGNOSIS — Z992 Dependence on renal dialysis: Secondary | ICD-10-CM | POA: Diagnosis not present

## 2019-05-21 DIAGNOSIS — U071 COVID-19: Secondary | ICD-10-CM | POA: Diagnosis not present

## 2019-05-21 DIAGNOSIS — E1122 Type 2 diabetes mellitus with diabetic chronic kidney disease: Secondary | ICD-10-CM | POA: Diagnosis not present

## 2019-05-21 DIAGNOSIS — D509 Iron deficiency anemia, unspecified: Secondary | ICD-10-CM | POA: Diagnosis not present

## 2019-05-21 DIAGNOSIS — J9601 Acute respiratory failure with hypoxia: Secondary | ICD-10-CM | POA: Diagnosis not present

## 2019-05-21 DIAGNOSIS — J1282 Pneumonia due to coronavirus disease 2019: Secondary | ICD-10-CM | POA: Diagnosis not present

## 2019-05-21 DIAGNOSIS — N186 End stage renal disease: Secondary | ICD-10-CM | POA: Diagnosis not present

## 2019-05-21 DIAGNOSIS — I132 Hypertensive heart and chronic kidney disease with heart failure and with stage 5 chronic kidney disease, or end stage renal disease: Secondary | ICD-10-CM | POA: Diagnosis not present

## 2019-05-22 DIAGNOSIS — J1282 Pneumonia due to coronavirus disease 2019: Secondary | ICD-10-CM | POA: Diagnosis not present

## 2019-05-22 DIAGNOSIS — K529 Noninfective gastroenteritis and colitis, unspecified: Secondary | ICD-10-CM | POA: Diagnosis not present

## 2019-05-22 DIAGNOSIS — I132 Hypertensive heart and chronic kidney disease with heart failure and with stage 5 chronic kidney disease, or end stage renal disease: Secondary | ICD-10-CM | POA: Diagnosis not present

## 2019-05-22 DIAGNOSIS — J9601 Acute respiratory failure with hypoxia: Secondary | ICD-10-CM | POA: Diagnosis not present

## 2019-05-22 DIAGNOSIS — E1122 Type 2 diabetes mellitus with diabetic chronic kidney disease: Secondary | ICD-10-CM | POA: Diagnosis not present

## 2019-05-22 DIAGNOSIS — Z992 Dependence on renal dialysis: Secondary | ICD-10-CM | POA: Diagnosis not present

## 2019-05-22 DIAGNOSIS — U071 COVID-19: Secondary | ICD-10-CM | POA: Diagnosis not present

## 2019-05-22 DIAGNOSIS — D631 Anemia in chronic kidney disease: Secondary | ICD-10-CM | POA: Diagnosis not present

## 2019-05-22 DIAGNOSIS — D509 Iron deficiency anemia, unspecified: Secondary | ICD-10-CM | POA: Diagnosis not present

## 2019-05-22 DIAGNOSIS — N186 End stage renal disease: Secondary | ICD-10-CM | POA: Diagnosis not present

## 2019-05-23 DIAGNOSIS — N186 End stage renal disease: Secondary | ICD-10-CM | POA: Diagnosis not present

## 2019-05-23 DIAGNOSIS — Z992 Dependence on renal dialysis: Secondary | ICD-10-CM | POA: Diagnosis not present

## 2019-05-23 DIAGNOSIS — D631 Anemia in chronic kidney disease: Secondary | ICD-10-CM | POA: Diagnosis not present

## 2019-05-23 DIAGNOSIS — D509 Iron deficiency anemia, unspecified: Secondary | ICD-10-CM | POA: Diagnosis not present

## 2019-05-24 ENCOUNTER — Other Ambulatory Visit: Payer: Self-pay

## 2019-05-24 ENCOUNTER — Ambulatory Visit: Payer: Medicare Other | Attending: Internal Medicine

## 2019-05-24 DIAGNOSIS — Z20822 Contact with and (suspected) exposure to covid-19: Secondary | ICD-10-CM

## 2019-05-24 DIAGNOSIS — N186 End stage renal disease: Secondary | ICD-10-CM | POA: Diagnosis not present

## 2019-05-24 DIAGNOSIS — D509 Iron deficiency anemia, unspecified: Secondary | ICD-10-CM | POA: Diagnosis not present

## 2019-05-24 DIAGNOSIS — Z992 Dependence on renal dialysis: Secondary | ICD-10-CM | POA: Diagnosis not present

## 2019-05-24 DIAGNOSIS — D631 Anemia in chronic kidney disease: Secondary | ICD-10-CM | POA: Diagnosis not present

## 2019-05-25 DIAGNOSIS — D509 Iron deficiency anemia, unspecified: Secondary | ICD-10-CM | POA: Diagnosis not present

## 2019-05-25 DIAGNOSIS — D631 Anemia in chronic kidney disease: Secondary | ICD-10-CM | POA: Diagnosis not present

## 2019-05-25 DIAGNOSIS — Z992 Dependence on renal dialysis: Secondary | ICD-10-CM | POA: Diagnosis not present

## 2019-05-25 DIAGNOSIS — N186 End stage renal disease: Secondary | ICD-10-CM | POA: Diagnosis not present

## 2019-05-25 LAB — NOVEL CORONAVIRUS, NAA: SARS-CoV-2, NAA: NOT DETECTED

## 2019-05-26 DIAGNOSIS — D509 Iron deficiency anemia, unspecified: Secondary | ICD-10-CM | POA: Diagnosis not present

## 2019-05-26 DIAGNOSIS — D631 Anemia in chronic kidney disease: Secondary | ICD-10-CM | POA: Diagnosis not present

## 2019-05-26 DIAGNOSIS — N186 End stage renal disease: Secondary | ICD-10-CM | POA: Diagnosis not present

## 2019-05-26 DIAGNOSIS — Z992 Dependence on renal dialysis: Secondary | ICD-10-CM | POA: Diagnosis not present

## 2019-05-27 ENCOUNTER — Other Ambulatory Visit: Payer: Self-pay

## 2019-05-27 ENCOUNTER — Ambulatory Visit (HOSPITAL_COMMUNITY)
Admission: RE | Admit: 2019-05-27 | Discharge: 2019-05-27 | Disposition: A | Discharge status: Home / Self Care | Payer: Medicare Other | Source: Ambulatory Visit | Attending: Nurse Practitioner | Admitting: Nurse Practitioner

## 2019-05-27 DIAGNOSIS — R197 Diarrhea, unspecified: Secondary | ICD-10-CM

## 2019-05-27 DIAGNOSIS — Z992 Dependence on renal dialysis: Secondary | ICD-10-CM | POA: Diagnosis not present

## 2019-05-27 DIAGNOSIS — D631 Anemia in chronic kidney disease: Secondary | ICD-10-CM | POA: Diagnosis not present

## 2019-05-27 DIAGNOSIS — D509 Iron deficiency anemia, unspecified: Secondary | ICD-10-CM | POA: Diagnosis not present

## 2019-05-27 DIAGNOSIS — N186 End stage renal disease: Secondary | ICD-10-CM | POA: Diagnosis not present

## 2019-05-27 DIAGNOSIS — R188 Other ascites: Secondary | ICD-10-CM | POA: Diagnosis not present

## 2019-05-27 DIAGNOSIS — R1030 Lower abdominal pain, unspecified: Secondary | ICD-10-CM | POA: Diagnosis not present

## 2019-05-27 IMAGING — CT CT CTA ABD/PEL W/CM AND/OR W/O CM
3 of 9 series · 11 of 46 positions shown, 17 images · IV contrast (Omnipaque or Isovue)
Comparison: [DATE]

CLINICAL DATA: Abdominal pain, nausea, heme-positive stools, bowel
wall thickening on CT, assess mesenteric ischemia

EXAM:
CTA ABDOMEN AND PELVIS WITH CONTRAST
TECHNIQUE: Multidetector CT imaging of the abdomen and pelvis was performed
using the standard protocol during bolus administration of
intravenous contrast. Multiplanar reconstructed images and MIPs were
obtained and reviewed to evaluate the vascular anatomy.
CONTRAST:  100mL OMNIPAQUE IOHEXOL 350 MG/ML SOLN

[Series 4: arterial · axial · arterial · 0.80mm/px · z∈[+1080,+1180]mm · 3 of 239 slices shown]
[im 26/239  soft-tissue]
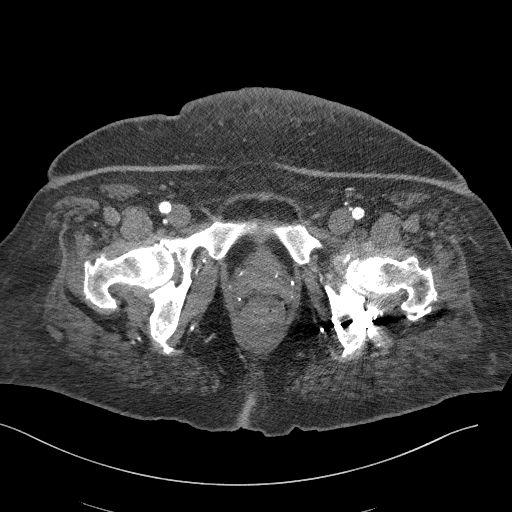
[im 51/239  soft-tissue]
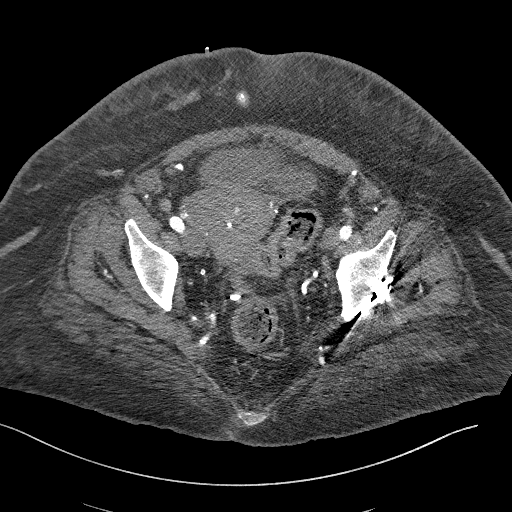
[im 76/239  soft-tissue]
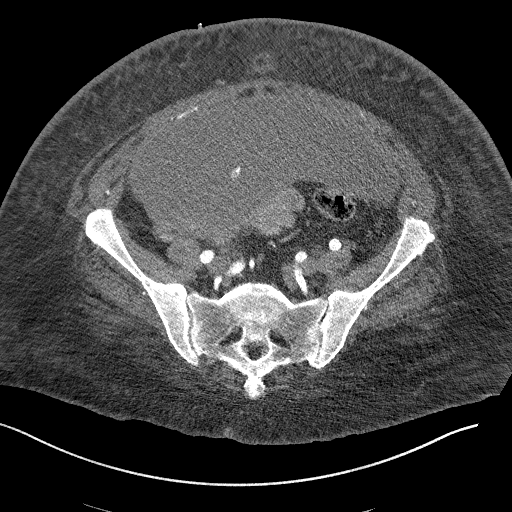

[Series 7: cor art · coronal · 0.90mm/px · 2 of 165 slices shown, 3 images]
[im 55/165  soft-tissue]
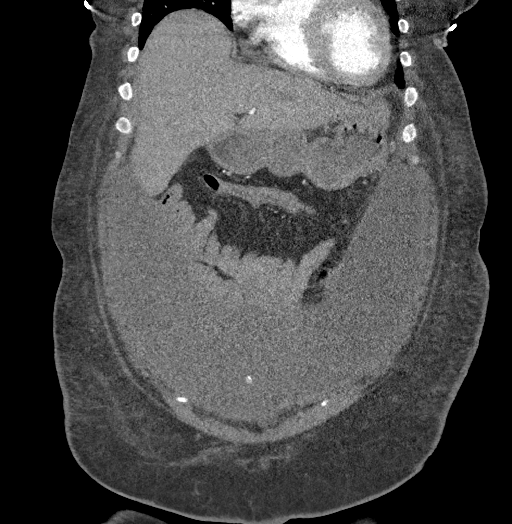
[im 55/165  bone]
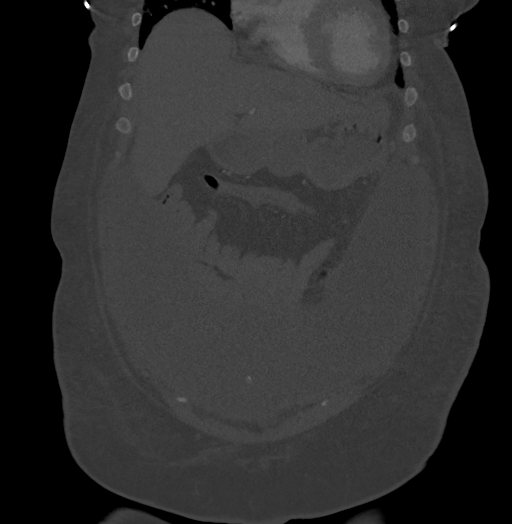
[im 110/165  soft-tissue]
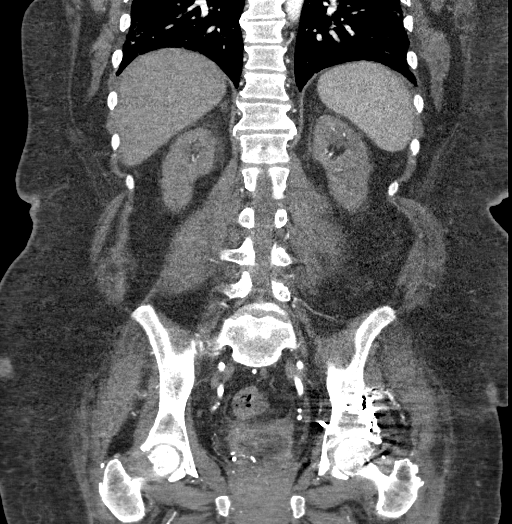

[Series 11: portal venous · axial · portal-venous · 0.80mm/px · z∈[+1098,+1433]mm · 6 of 95 slices shown, 11 images]
[im 14/95  soft-tissue]
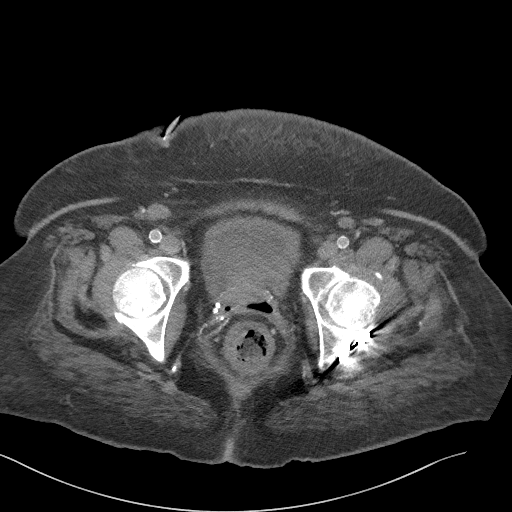
[im 14/95  bone]
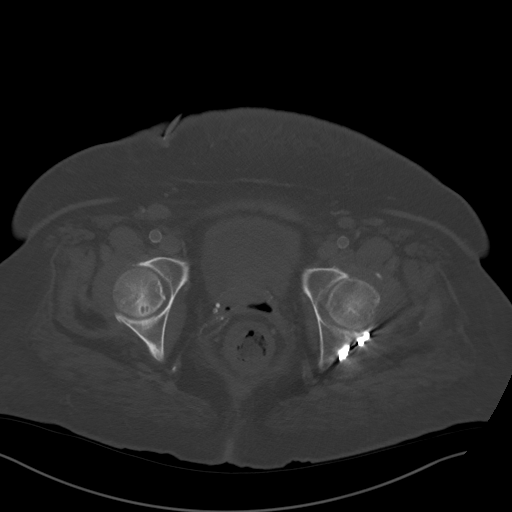
[im 27/95  soft-tissue]
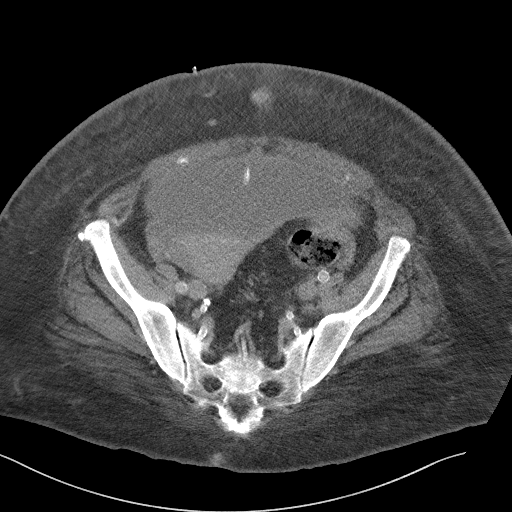
[im 41/95  soft-tissue]
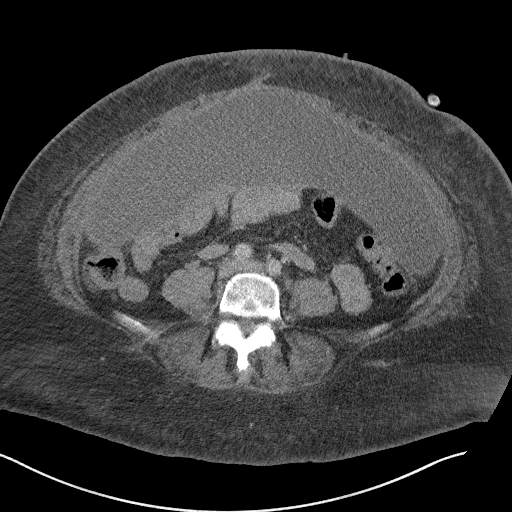
[im 41/95  lung]
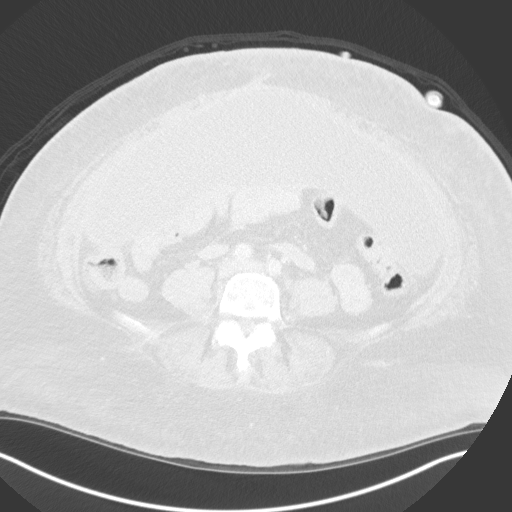
[im 54/95  soft-tissue]
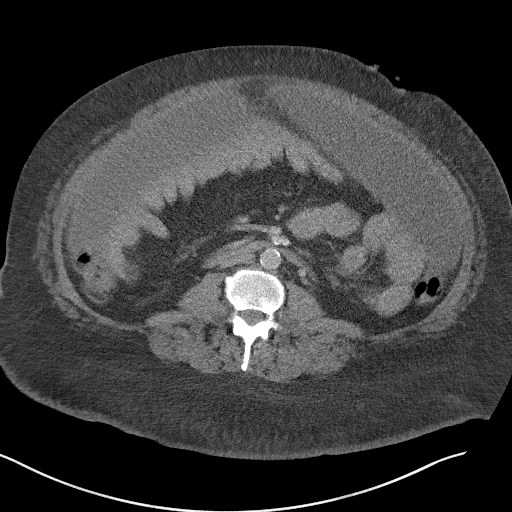
[im 54/95  lung]
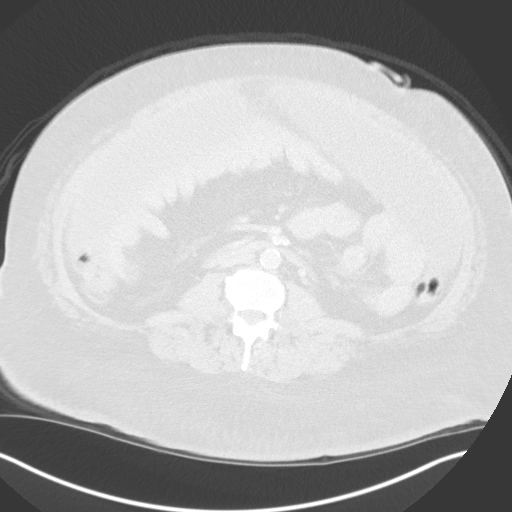
[im 68/95  soft-tissue]
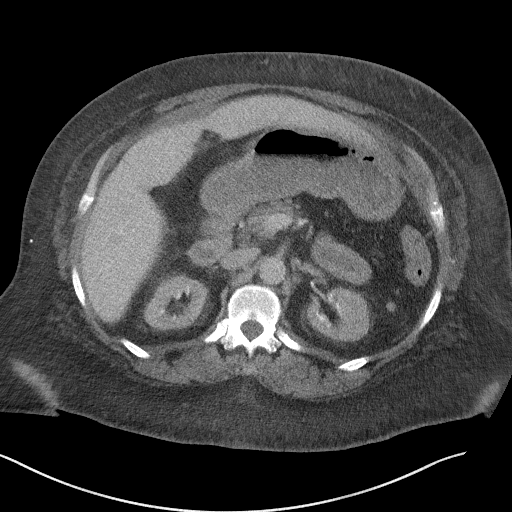
[im 68/95  lung]
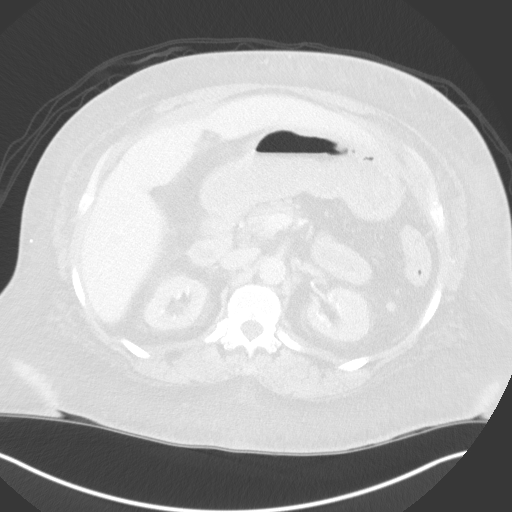
[im 81/95  soft-tissue]
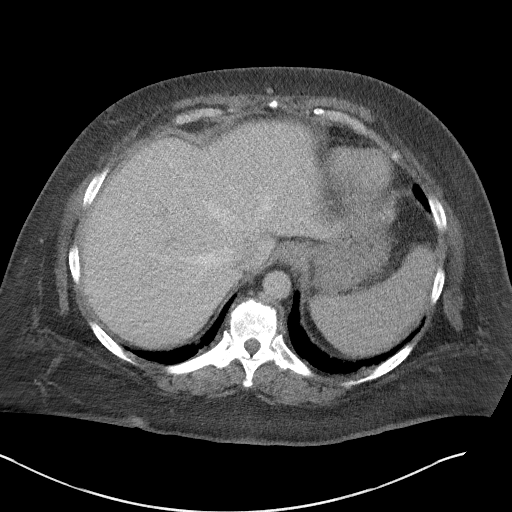
[im 81/95  lung]
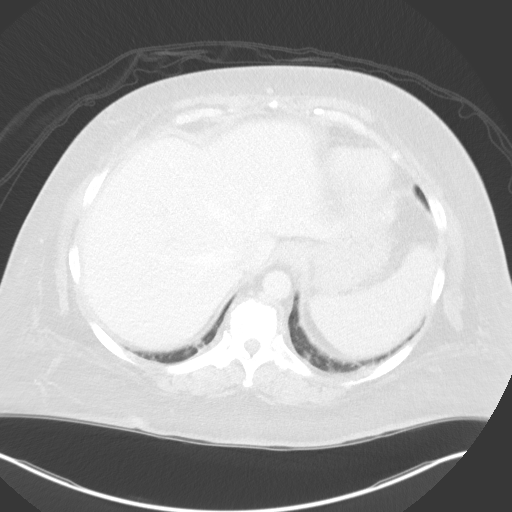

[11 of 46 positions shown; findings below may reference images not displayed]

FINDINGS: VASCULAR

Coronary calcifications.

Aorta: Moderate calcified atheromatous plaque predominantly in the
infrarenal segment. No aneurysm, dissection, or stenosis.

Celiac: Patent without evidence of aneurysm, dissection, vasculitis
or significant stenosis.

SMA: Atheromatous, without high-grade stenosis or occlusion, classic
distal branch anatomy.

Renals: Single left, with extensive medial calcifications in
parenchymal branches, no high-grade proximal stenosis.

Single right, patent proximally, with scattered medial
calcifications in distal branches.

IMA: Patent, with nonocclusive medial calcifications

Inflow: Extensive iliac medial calcifications without significant
stenosis, aneurysm or dissection.

Proximal Outflow: Heavy medial calcifications without stenosis or
occlusion in the visualized proximal segments.

Veins: Patent hepatic veins, portal vein, SMV, splenic vein,
bilateral renal veins. Incomplete opacification of the iliac venous
system and IVC, which are unremarkable.

Review of the MIP images confirms the above findings.

NON-VASCULAR

Lower chest: No pleural or pericardial effusion. Coarse subpleural
linear opacities in both lung bases.

Hepatobiliary: No focal liver abnormality is seen. Status post
cholecystectomy. No biliary dilatation.

Pancreas: Unremarkable. No pancreatic ductal dilatation or
surrounding inflammatory changes.

Spleen: Normal in size without focal abnormality.

Adrenals/Urinary Tract: Adrenal glands unremarkable.

Bilateral diffuse renal parenchymal atrophy. No hydronephrosis.
Subcentimeter low-attenuation right lower pole lesion, incompletely
characterized. Urinary bladder is nondistended.

Stomach/Bowel: Stomach is incompletely distended. Small bowel
decompressed. The wall thickening seen previously is no longer
conspicuous. The colon is nondilated.

Lymphatic: Increased prominence of left para-aortic lymph nodes
although none is greater than 1 cm short axis diameter. No pelvic or
mesenteric adenopathy.

Reproductive: IUD in place. No adnexal masses.

Other: Interval increase in anterior peritoneal ascites with
peritoneal thickening/enhancement, indwelling peritoneal dialysis
catheter, and a few non dependent gas bubbles.

Musculoskeletal: Posterior left acetabular orthopedic fixation
hardware as before. No acute fracture or worrisome bone lesion.
Bilateral hip DJD.
IMPRESSION: VASCULAR

1. Patent celiac, superior mesenteric, and inferior mesenteric
arteries without high-grade stenosis or occlusion. The findings
would not support a diagnosis of occlusive mesenteric ischemia.
2.  Aortic Atherosclerosis ([HE]-170.0).

NON-VASCULAR

1. Interval increase in anterior peritoneal ascites with a few non
dependent gas bubbles, and diffuse peritoneal
thickening/enhancement. Correlate with any clinical/laboratory
evidence of peritonitis.

## 2019-05-27 MED ORDER — IOHEXOL 350 MG/ML SOLN
100.0000 mL | Freq: Once | INTRAVENOUS | Status: AC | PRN
  Administered 2019-05-27: 100 mL via INTRAVENOUS

## 2019-05-28 DIAGNOSIS — M25572 Pain in left ankle and joints of left foot: Secondary | ICD-10-CM | POA: Diagnosis not present

## 2019-05-28 DIAGNOSIS — D509 Iron deficiency anemia, unspecified: Secondary | ICD-10-CM | POA: Diagnosis not present

## 2019-05-28 DIAGNOSIS — Z992 Dependence on renal dialysis: Secondary | ICD-10-CM | POA: Diagnosis not present

## 2019-05-28 DIAGNOSIS — M25561 Pain in right knee: Secondary | ICD-10-CM | POA: Diagnosis not present

## 2019-05-28 DIAGNOSIS — D631 Anemia in chronic kidney disease: Secondary | ICD-10-CM | POA: Diagnosis not present

## 2019-05-28 DIAGNOSIS — N186 End stage renal disease: Secondary | ICD-10-CM | POA: Diagnosis not present

## 2019-05-29 DIAGNOSIS — D509 Iron deficiency anemia, unspecified: Secondary | ICD-10-CM | POA: Diagnosis not present

## 2019-05-29 DIAGNOSIS — Z992 Dependence on renal dialysis: Secondary | ICD-10-CM | POA: Diagnosis not present

## 2019-05-29 DIAGNOSIS — U071 COVID-19: Secondary | ICD-10-CM | POA: Diagnosis not present

## 2019-05-29 DIAGNOSIS — D631 Anemia in chronic kidney disease: Secondary | ICD-10-CM | POA: Diagnosis not present

## 2019-05-29 DIAGNOSIS — J9601 Acute respiratory failure with hypoxia: Secondary | ICD-10-CM | POA: Diagnosis not present

## 2019-05-29 DIAGNOSIS — E1122 Type 2 diabetes mellitus with diabetic chronic kidney disease: Secondary | ICD-10-CM | POA: Diagnosis not present

## 2019-05-29 DIAGNOSIS — I132 Hypertensive heart and chronic kidney disease with heart failure and with stage 5 chronic kidney disease, or end stage renal disease: Secondary | ICD-10-CM | POA: Diagnosis not present

## 2019-05-29 DIAGNOSIS — J1282 Pneumonia due to coronavirus disease 2019: Secondary | ICD-10-CM | POA: Diagnosis not present

## 2019-05-29 DIAGNOSIS — K529 Noninfective gastroenteritis and colitis, unspecified: Secondary | ICD-10-CM | POA: Diagnosis not present

## 2019-05-29 DIAGNOSIS — N186 End stage renal disease: Secondary | ICD-10-CM | POA: Diagnosis not present

## 2019-05-30 ENCOUNTER — Telehealth: Payer: Self-pay | Admitting: Internal Medicine

## 2019-05-30 ENCOUNTER — Ambulatory Visit: Payer: Medicare Other | Admitting: "Endocrinology

## 2019-05-30 DIAGNOSIS — M25572 Pain in left ankle and joints of left foot: Secondary | ICD-10-CM | POA: Diagnosis not present

## 2019-05-30 DIAGNOSIS — D631 Anemia in chronic kidney disease: Secondary | ICD-10-CM | POA: Diagnosis not present

## 2019-05-30 DIAGNOSIS — M25561 Pain in right knee: Secondary | ICD-10-CM | POA: Diagnosis not present

## 2019-05-30 DIAGNOSIS — D509 Iron deficiency anemia, unspecified: Secondary | ICD-10-CM | POA: Diagnosis not present

## 2019-05-30 DIAGNOSIS — N186 End stage renal disease: Secondary | ICD-10-CM | POA: Diagnosis not present

## 2019-05-30 DIAGNOSIS — Z992 Dependence on renal dialysis: Secondary | ICD-10-CM | POA: Diagnosis not present

## 2019-05-30 NOTE — Telephone Encounter (Signed)
Pt was checking to see if her CT results were back. I told her it could take up to 7-10 business days, but I would let the nure know that she had called. Pt also said that she is still having constant diarrhea. [phone]

## 2019-05-30 NOTE — Telephone Encounter (Signed)
Pt is inquiring about her CT results. Pts diarrhea constant throughout the day. Pt says she had her MRI recently and had diarrhea on the MRI table. Diarrhea is coming after she eats anything and at night every hour on the hour. Pt isn't taking any otc medications to stop diarrhea.

## 2019-05-31 DIAGNOSIS — D631 Anemia in chronic kidney disease: Secondary | ICD-10-CM | POA: Diagnosis not present

## 2019-05-31 DIAGNOSIS — Z992 Dependence on renal dialysis: Secondary | ICD-10-CM | POA: Diagnosis not present

## 2019-05-31 DIAGNOSIS — D509 Iron deficiency anemia, unspecified: Secondary | ICD-10-CM | POA: Diagnosis not present

## 2019-05-31 DIAGNOSIS — N186 End stage renal disease: Secondary | ICD-10-CM | POA: Diagnosis not present

## 2019-06-01 DIAGNOSIS — D631 Anemia in chronic kidney disease: Secondary | ICD-10-CM | POA: Diagnosis not present

## 2019-06-01 DIAGNOSIS — N186 End stage renal disease: Secondary | ICD-10-CM | POA: Diagnosis not present

## 2019-06-01 DIAGNOSIS — Z992 Dependence on renal dialysis: Secondary | ICD-10-CM | POA: Diagnosis not present

## 2019-06-01 DIAGNOSIS — D509 Iron deficiency anemia, unspecified: Secondary | ICD-10-CM | POA: Diagnosis not present

## 2019-06-02 DIAGNOSIS — Z992 Dependence on renal dialysis: Secondary | ICD-10-CM | POA: Diagnosis not present

## 2019-06-02 DIAGNOSIS — D509 Iron deficiency anemia, unspecified: Secondary | ICD-10-CM | POA: Diagnosis not present

## 2019-06-02 DIAGNOSIS — N186 End stage renal disease: Secondary | ICD-10-CM | POA: Diagnosis not present

## 2019-06-02 DIAGNOSIS — D631 Anemia in chronic kidney disease: Secondary | ICD-10-CM | POA: Diagnosis not present

## 2019-06-03 DIAGNOSIS — D631 Anemia in chronic kidney disease: Secondary | ICD-10-CM | POA: Diagnosis not present

## 2019-06-03 DIAGNOSIS — Z992 Dependence on renal dialysis: Secondary | ICD-10-CM | POA: Diagnosis not present

## 2019-06-03 DIAGNOSIS — D509 Iron deficiency anemia, unspecified: Secondary | ICD-10-CM | POA: Diagnosis not present

## 2019-06-03 DIAGNOSIS — N186 End stage renal disease: Secondary | ICD-10-CM | POA: Diagnosis not present

## 2019-06-04 DIAGNOSIS — M79661 Pain in right lower leg: Secondary | ICD-10-CM | POA: Diagnosis not present

## 2019-06-04 DIAGNOSIS — D631 Anemia in chronic kidney disease: Secondary | ICD-10-CM | POA: Diagnosis not present

## 2019-06-04 DIAGNOSIS — S82831A Other fracture of upper and lower end of right fibula, initial encounter for closed fracture: Secondary | ICD-10-CM | POA: Diagnosis not present

## 2019-06-04 DIAGNOSIS — N186 End stage renal disease: Secondary | ICD-10-CM | POA: Diagnosis not present

## 2019-06-04 DIAGNOSIS — Z992 Dependence on renal dialysis: Secondary | ICD-10-CM | POA: Diagnosis not present

## 2019-06-04 DIAGNOSIS — M25571 Pain in right ankle and joints of right foot: Secondary | ICD-10-CM | POA: Diagnosis not present

## 2019-06-04 DIAGNOSIS — D509 Iron deficiency anemia, unspecified: Secondary | ICD-10-CM | POA: Diagnosis not present

## 2019-06-05 ENCOUNTER — Telehealth: Payer: Self-pay | Admitting: Internal Medicine

## 2019-06-05 DIAGNOSIS — U071 COVID-19: Secondary | ICD-10-CM | POA: Diagnosis not present

## 2019-06-05 DIAGNOSIS — N186 End stage renal disease: Secondary | ICD-10-CM | POA: Diagnosis not present

## 2019-06-05 DIAGNOSIS — D631 Anemia in chronic kidney disease: Secondary | ICD-10-CM | POA: Diagnosis not present

## 2019-06-05 DIAGNOSIS — J1282 Pneumonia due to coronavirus disease 2019: Secondary | ICD-10-CM | POA: Diagnosis not present

## 2019-06-05 DIAGNOSIS — D509 Iron deficiency anemia, unspecified: Secondary | ICD-10-CM | POA: Diagnosis not present

## 2019-06-05 DIAGNOSIS — K529 Noninfective gastroenteritis and colitis, unspecified: Secondary | ICD-10-CM | POA: Diagnosis not present

## 2019-06-05 DIAGNOSIS — J9601 Acute respiratory failure with hypoxia: Secondary | ICD-10-CM | POA: Diagnosis not present

## 2019-06-05 DIAGNOSIS — E1122 Type 2 diabetes mellitus with diabetic chronic kidney disease: Secondary | ICD-10-CM | POA: Diagnosis not present

## 2019-06-05 DIAGNOSIS — Z992 Dependence on renal dialysis: Secondary | ICD-10-CM | POA: Diagnosis not present

## 2019-06-05 DIAGNOSIS — I132 Hypertensive heart and chronic kidney disease with heart failure and with stage 5 chronic kidney disease, or end stage renal disease: Secondary | ICD-10-CM | POA: Diagnosis not present

## 2019-06-05 NOTE — Telephone Encounter (Signed)
Pt is inquiring about results. 

## 2019-06-05 NOTE — Telephone Encounter (Signed)
Pt called asking if her CT results were available because she seen it on MyChart. [phone]

## 2019-06-06 DIAGNOSIS — Z992 Dependence on renal dialysis: Secondary | ICD-10-CM | POA: Diagnosis not present

## 2019-06-06 DIAGNOSIS — D631 Anemia in chronic kidney disease: Secondary | ICD-10-CM | POA: Diagnosis not present

## 2019-06-06 DIAGNOSIS — D509 Iron deficiency anemia, unspecified: Secondary | ICD-10-CM | POA: Diagnosis not present

## 2019-06-06 DIAGNOSIS — N186 End stage renal disease: Secondary | ICD-10-CM | POA: Diagnosis not present

## 2019-06-06 NOTE — Telephone Encounter (Signed)
See result note (which was sent to her MyChart)

## 2019-06-06 NOTE — Telephone Encounter (Signed)
Noted  

## 2019-06-07 DIAGNOSIS — Z992 Dependence on renal dialysis: Secondary | ICD-10-CM | POA: Diagnosis not present

## 2019-06-07 DIAGNOSIS — N186 End stage renal disease: Secondary | ICD-10-CM | POA: Diagnosis not present

## 2019-06-07 DIAGNOSIS — D509 Iron deficiency anemia, unspecified: Secondary | ICD-10-CM | POA: Diagnosis not present

## 2019-06-07 DIAGNOSIS — D631 Anemia in chronic kidney disease: Secondary | ICD-10-CM | POA: Diagnosis not present

## 2019-06-08 ENCOUNTER — Ambulatory Visit: Payer: Medicare Other | Attending: Internal Medicine

## 2019-06-08 DIAGNOSIS — D509 Iron deficiency anemia, unspecified: Secondary | ICD-10-CM | POA: Diagnosis not present

## 2019-06-08 DIAGNOSIS — N186 End stage renal disease: Secondary | ICD-10-CM | POA: Diagnosis not present

## 2019-06-08 DIAGNOSIS — Z23 Encounter for immunization: Secondary | ICD-10-CM

## 2019-06-08 DIAGNOSIS — D631 Anemia in chronic kidney disease: Secondary | ICD-10-CM | POA: Diagnosis not present

## 2019-06-08 DIAGNOSIS — Z992 Dependence on renal dialysis: Secondary | ICD-10-CM | POA: Diagnosis not present

## 2019-06-08 NOTE — Progress Notes (Signed)
   Covid-19 Vaccination Clinic  Name:  Lloyd Cullinan Pinnix    MRN: [id number] DOB: 1965-12-27  06/08/2019  Ms. Elnoria Howard Pinnix was observed post Covid-19 immunization for 15 minutes without incident. She was provided with Vaccine Information Sheet and instruction to access the V-Safe system.   Ms. Elnoria Howard Pinnix was instructed to call 911 with any severe reactions post vaccine: Marland Kitchen Difficulty breathing  . Swelling of face and throat  . A fast heartbeat  . A bad rash all over body  . Dizziness and weakness   Immunizations Administered    Name Date Dose VIS Date Route   Moderna COVID-19 Vaccine 06/08/2019 11:40 AM 0.5 mL 02/26/2019 Intramuscular   Manufacturer: Moderna   Lot: [id number]   [place]: [phone]

## 2019-06-09 DIAGNOSIS — D509 Iron deficiency anemia, unspecified: Secondary | ICD-10-CM | POA: Diagnosis not present

## 2019-06-09 DIAGNOSIS — D631 Anemia in chronic kidney disease: Secondary | ICD-10-CM | POA: Diagnosis not present

## 2019-06-09 DIAGNOSIS — N186 End stage renal disease: Secondary | ICD-10-CM | POA: Diagnosis not present

## 2019-06-09 DIAGNOSIS — Z992 Dependence on renal dialysis: Secondary | ICD-10-CM | POA: Diagnosis not present

## 2019-06-10 ENCOUNTER — Ambulatory Visit: Payer: Medicare Other | Admitting: "Endocrinology

## 2019-06-10 DIAGNOSIS — D631 Anemia in chronic kidney disease: Secondary | ICD-10-CM | POA: Diagnosis not present

## 2019-06-10 DIAGNOSIS — N186 End stage renal disease: Secondary | ICD-10-CM | POA: Diagnosis not present

## 2019-06-10 DIAGNOSIS — D509 Iron deficiency anemia, unspecified: Secondary | ICD-10-CM | POA: Diagnosis not present

## 2019-06-10 DIAGNOSIS — Z992 Dependence on renal dialysis: Secondary | ICD-10-CM | POA: Diagnosis not present

## 2019-06-11 DIAGNOSIS — D509 Iron deficiency anemia, unspecified: Secondary | ICD-10-CM | POA: Diagnosis not present

## 2019-06-11 DIAGNOSIS — Z992 Dependence on renal dialysis: Secondary | ICD-10-CM | POA: Diagnosis not present

## 2019-06-11 DIAGNOSIS — D631 Anemia in chronic kidney disease: Secondary | ICD-10-CM | POA: Diagnosis not present

## 2019-06-11 DIAGNOSIS — N186 End stage renal disease: Secondary | ICD-10-CM | POA: Diagnosis not present

## 2019-06-12 DIAGNOSIS — D509 Iron deficiency anemia, unspecified: Secondary | ICD-10-CM | POA: Diagnosis not present

## 2019-06-12 DIAGNOSIS — D631 Anemia in chronic kidney disease: Secondary | ICD-10-CM | POA: Diagnosis not present

## 2019-06-12 DIAGNOSIS — N186 End stage renal disease: Secondary | ICD-10-CM | POA: Diagnosis not present

## 2019-06-12 DIAGNOSIS — Z992 Dependence on renal dialysis: Secondary | ICD-10-CM | POA: Diagnosis not present

## 2019-06-13 DIAGNOSIS — D509 Iron deficiency anemia, unspecified: Secondary | ICD-10-CM | POA: Diagnosis not present

## 2019-06-13 DIAGNOSIS — D631 Anemia in chronic kidney disease: Secondary | ICD-10-CM | POA: Diagnosis not present

## 2019-06-13 DIAGNOSIS — Z992 Dependence on renal dialysis: Secondary | ICD-10-CM | POA: Diagnosis not present

## 2019-06-13 DIAGNOSIS — N186 End stage renal disease: Secondary | ICD-10-CM | POA: Diagnosis not present

## 2019-06-14 DIAGNOSIS — Z992 Dependence on renal dialysis: Secondary | ICD-10-CM | POA: Diagnosis not present

## 2019-06-14 DIAGNOSIS — N186 End stage renal disease: Secondary | ICD-10-CM | POA: Diagnosis not present

## 2019-06-14 DIAGNOSIS — D631 Anemia in chronic kidney disease: Secondary | ICD-10-CM | POA: Diagnosis not present

## 2019-06-14 DIAGNOSIS — D509 Iron deficiency anemia, unspecified: Secondary | ICD-10-CM | POA: Diagnosis not present

## 2019-06-15 DIAGNOSIS — D631 Anemia in chronic kidney disease: Secondary | ICD-10-CM | POA: Diagnosis not present

## 2019-06-15 DIAGNOSIS — D509 Iron deficiency anemia, unspecified: Secondary | ICD-10-CM | POA: Diagnosis not present

## 2019-06-15 DIAGNOSIS — Z992 Dependence on renal dialysis: Secondary | ICD-10-CM | POA: Diagnosis not present

## 2019-06-15 DIAGNOSIS — N186 End stage renal disease: Secondary | ICD-10-CM | POA: Diagnosis not present

## 2019-06-16 DIAGNOSIS — N186 End stage renal disease: Secondary | ICD-10-CM | POA: Diagnosis not present

## 2019-06-16 DIAGNOSIS — Z992 Dependence on renal dialysis: Secondary | ICD-10-CM | POA: Diagnosis not present

## 2019-06-16 DIAGNOSIS — D631 Anemia in chronic kidney disease: Secondary | ICD-10-CM | POA: Diagnosis not present

## 2019-06-16 DIAGNOSIS — D509 Iron deficiency anemia, unspecified: Secondary | ICD-10-CM | POA: Diagnosis not present

## 2019-06-17 DIAGNOSIS — D631 Anemia in chronic kidney disease: Secondary | ICD-10-CM | POA: Diagnosis not present

## 2019-06-17 DIAGNOSIS — N186 End stage renal disease: Secondary | ICD-10-CM | POA: Diagnosis not present

## 2019-06-17 DIAGNOSIS — Z992 Dependence on renal dialysis: Secondary | ICD-10-CM | POA: Diagnosis not present

## 2019-06-17 DIAGNOSIS — D509 Iron deficiency anemia, unspecified: Secondary | ICD-10-CM | POA: Diagnosis not present

## 2019-06-18 DIAGNOSIS — N186 End stage renal disease: Secondary | ICD-10-CM | POA: Diagnosis not present

## 2019-06-18 DIAGNOSIS — Z992 Dependence on renal dialysis: Secondary | ICD-10-CM | POA: Diagnosis not present

## 2019-06-18 DIAGNOSIS — D631 Anemia in chronic kidney disease: Secondary | ICD-10-CM | POA: Diagnosis not present

## 2019-06-18 DIAGNOSIS — D509 Iron deficiency anemia, unspecified: Secondary | ICD-10-CM | POA: Diagnosis not present

## 2019-06-19 DIAGNOSIS — D631 Anemia in chronic kidney disease: Secondary | ICD-10-CM | POA: Diagnosis not present

## 2019-06-19 DIAGNOSIS — D509 Iron deficiency anemia, unspecified: Secondary | ICD-10-CM | POA: Diagnosis not present

## 2019-06-19 DIAGNOSIS — N186 End stage renal disease: Secondary | ICD-10-CM | POA: Diagnosis not present

## 2019-06-19 DIAGNOSIS — Z992 Dependence on renal dialysis: Secondary | ICD-10-CM | POA: Diagnosis not present

## 2019-06-20 DIAGNOSIS — N186 End stage renal disease: Secondary | ICD-10-CM | POA: Diagnosis not present

## 2019-06-20 DIAGNOSIS — Z992 Dependence on renal dialysis: Secondary | ICD-10-CM | POA: Diagnosis not present

## 2019-06-20 DIAGNOSIS — D631 Anemia in chronic kidney disease: Secondary | ICD-10-CM | POA: Diagnosis not present

## 2019-06-20 DIAGNOSIS — D509 Iron deficiency anemia, unspecified: Secondary | ICD-10-CM | POA: Diagnosis not present

## 2019-06-21 DIAGNOSIS — N186 End stage renal disease: Secondary | ICD-10-CM | POA: Diagnosis not present

## 2019-06-21 DIAGNOSIS — D509 Iron deficiency anemia, unspecified: Secondary | ICD-10-CM | POA: Diagnosis not present

## 2019-06-21 DIAGNOSIS — D631 Anemia in chronic kidney disease: Secondary | ICD-10-CM | POA: Diagnosis not present

## 2019-06-21 DIAGNOSIS — Z992 Dependence on renal dialysis: Secondary | ICD-10-CM | POA: Diagnosis not present

## 2019-06-22 DIAGNOSIS — D631 Anemia in chronic kidney disease: Secondary | ICD-10-CM | POA: Diagnosis not present

## 2019-06-22 DIAGNOSIS — D509 Iron deficiency anemia, unspecified: Secondary | ICD-10-CM | POA: Diagnosis not present

## 2019-06-22 DIAGNOSIS — N186 End stage renal disease: Secondary | ICD-10-CM | POA: Diagnosis not present

## 2019-06-22 DIAGNOSIS — Z992 Dependence on renal dialysis: Secondary | ICD-10-CM | POA: Diagnosis not present

## 2019-06-23 DIAGNOSIS — Z992 Dependence on renal dialysis: Secondary | ICD-10-CM | POA: Diagnosis not present

## 2019-06-23 DIAGNOSIS — D509 Iron deficiency anemia, unspecified: Secondary | ICD-10-CM | POA: Diagnosis not present

## 2019-06-23 DIAGNOSIS — N186 End stage renal disease: Secondary | ICD-10-CM | POA: Diagnosis not present

## 2019-06-23 DIAGNOSIS — D631 Anemia in chronic kidney disease: Secondary | ICD-10-CM | POA: Diagnosis not present

## 2019-06-24 DIAGNOSIS — N186 End stage renal disease: Secondary | ICD-10-CM | POA: Diagnosis not present

## 2019-06-24 DIAGNOSIS — D509 Iron deficiency anemia, unspecified: Secondary | ICD-10-CM | POA: Diagnosis not present

## 2019-06-24 DIAGNOSIS — D631 Anemia in chronic kidney disease: Secondary | ICD-10-CM | POA: Diagnosis not present

## 2019-06-24 DIAGNOSIS — Z992 Dependence on renal dialysis: Secondary | ICD-10-CM | POA: Diagnosis not present

## 2019-06-25 DIAGNOSIS — N186 End stage renal disease: Secondary | ICD-10-CM | POA: Diagnosis not present

## 2019-06-25 DIAGNOSIS — D509 Iron deficiency anemia, unspecified: Secondary | ICD-10-CM | POA: Diagnosis not present

## 2019-06-25 DIAGNOSIS — D631 Anemia in chronic kidney disease: Secondary | ICD-10-CM | POA: Diagnosis not present

## 2019-06-25 DIAGNOSIS — Z992 Dependence on renal dialysis: Secondary | ICD-10-CM | POA: Diagnosis not present

## 2019-06-26 DIAGNOSIS — N186 End stage renal disease: Secondary | ICD-10-CM | POA: Diagnosis not present

## 2019-06-26 DIAGNOSIS — D631 Anemia in chronic kidney disease: Secondary | ICD-10-CM | POA: Diagnosis not present

## 2019-06-26 DIAGNOSIS — Z992 Dependence on renal dialysis: Secondary | ICD-10-CM | POA: Diagnosis not present

## 2019-06-26 DIAGNOSIS — D509 Iron deficiency anemia, unspecified: Secondary | ICD-10-CM | POA: Diagnosis not present

## 2019-06-27 DIAGNOSIS — D631 Anemia in chronic kidney disease: Secondary | ICD-10-CM | POA: Diagnosis not present

## 2019-06-27 DIAGNOSIS — N186 End stage renal disease: Secondary | ICD-10-CM | POA: Diagnosis not present

## 2019-06-27 DIAGNOSIS — Z992 Dependence on renal dialysis: Secondary | ICD-10-CM | POA: Diagnosis not present

## 2019-06-27 DIAGNOSIS — N2581 Secondary hyperparathyroidism of renal origin: Secondary | ICD-10-CM | POA: Diagnosis not present

## 2019-06-27 DIAGNOSIS — D509 Iron deficiency anemia, unspecified: Secondary | ICD-10-CM | POA: Diagnosis not present

## 2019-06-28 DIAGNOSIS — D509 Iron deficiency anemia, unspecified: Secondary | ICD-10-CM | POA: Diagnosis not present

## 2019-06-28 DIAGNOSIS — N186 End stage renal disease: Secondary | ICD-10-CM | POA: Diagnosis not present

## 2019-06-28 DIAGNOSIS — N2581 Secondary hyperparathyroidism of renal origin: Secondary | ICD-10-CM | POA: Diagnosis not present

## 2019-06-28 DIAGNOSIS — D631 Anemia in chronic kidney disease: Secondary | ICD-10-CM | POA: Diagnosis not present

## 2019-06-28 DIAGNOSIS — Z992 Dependence on renal dialysis: Secondary | ICD-10-CM | POA: Diagnosis not present

## 2019-06-29 DIAGNOSIS — D631 Anemia in chronic kidney disease: Secondary | ICD-10-CM | POA: Diagnosis not present

## 2019-06-29 DIAGNOSIS — N186 End stage renal disease: Secondary | ICD-10-CM | POA: Diagnosis not present

## 2019-06-29 DIAGNOSIS — D509 Iron deficiency anemia, unspecified: Secondary | ICD-10-CM | POA: Diagnosis not present

## 2019-06-29 DIAGNOSIS — Z992 Dependence on renal dialysis: Secondary | ICD-10-CM | POA: Diagnosis not present

## 2019-06-29 DIAGNOSIS — N2581 Secondary hyperparathyroidism of renal origin: Secondary | ICD-10-CM | POA: Diagnosis not present

## 2019-06-30 DIAGNOSIS — N186 End stage renal disease: Secondary | ICD-10-CM | POA: Diagnosis not present

## 2019-06-30 DIAGNOSIS — N2581 Secondary hyperparathyroidism of renal origin: Secondary | ICD-10-CM | POA: Diagnosis not present

## 2019-06-30 DIAGNOSIS — D509 Iron deficiency anemia, unspecified: Secondary | ICD-10-CM | POA: Diagnosis not present

## 2019-06-30 DIAGNOSIS — D631 Anemia in chronic kidney disease: Secondary | ICD-10-CM | POA: Diagnosis not present

## 2019-06-30 DIAGNOSIS — Z992 Dependence on renal dialysis: Secondary | ICD-10-CM | POA: Diagnosis not present

## 2019-07-01 DIAGNOSIS — D631 Anemia in chronic kidney disease: Secondary | ICD-10-CM | POA: Diagnosis not present

## 2019-07-01 DIAGNOSIS — N2581 Secondary hyperparathyroidism of renal origin: Secondary | ICD-10-CM | POA: Diagnosis not present

## 2019-07-01 DIAGNOSIS — D509 Iron deficiency anemia, unspecified: Secondary | ICD-10-CM | POA: Diagnosis not present

## 2019-07-01 DIAGNOSIS — Z992 Dependence on renal dialysis: Secondary | ICD-10-CM | POA: Diagnosis not present

## 2019-07-01 DIAGNOSIS — N186 End stage renal disease: Secondary | ICD-10-CM | POA: Diagnosis not present

## 2019-07-02 DIAGNOSIS — D631 Anemia in chronic kidney disease: Secondary | ICD-10-CM | POA: Diagnosis not present

## 2019-07-02 DIAGNOSIS — D509 Iron deficiency anemia, unspecified: Secondary | ICD-10-CM | POA: Diagnosis not present

## 2019-07-02 DIAGNOSIS — N186 End stage renal disease: Secondary | ICD-10-CM | POA: Diagnosis not present

## 2019-07-02 DIAGNOSIS — N2581 Secondary hyperparathyroidism of renal origin: Secondary | ICD-10-CM | POA: Diagnosis not present

## 2019-07-02 DIAGNOSIS — Z992 Dependence on renal dialysis: Secondary | ICD-10-CM | POA: Diagnosis not present

## 2019-07-03 DIAGNOSIS — Z794 Long term (current) use of insulin: Secondary | ICD-10-CM | POA: Diagnosis not present

## 2019-07-03 DIAGNOSIS — D631 Anemia in chronic kidney disease: Secondary | ICD-10-CM | POA: Diagnosis not present

## 2019-07-03 DIAGNOSIS — Z992 Dependence on renal dialysis: Secondary | ICD-10-CM | POA: Diagnosis not present

## 2019-07-03 DIAGNOSIS — N186 End stage renal disease: Secondary | ICD-10-CM | POA: Diagnosis not present

## 2019-07-03 DIAGNOSIS — D509 Iron deficiency anemia, unspecified: Secondary | ICD-10-CM | POA: Diagnosis not present

## 2019-07-03 DIAGNOSIS — N2581 Secondary hyperparathyroidism of renal origin: Secondary | ICD-10-CM | POA: Diagnosis not present

## 2019-07-03 DIAGNOSIS — E119 Type 2 diabetes mellitus without complications: Secondary | ICD-10-CM | POA: Diagnosis not present

## 2019-07-04 DIAGNOSIS — D631 Anemia in chronic kidney disease: Secondary | ICD-10-CM | POA: Diagnosis not present

## 2019-07-04 DIAGNOSIS — Z992 Dependence on renal dialysis: Secondary | ICD-10-CM | POA: Diagnosis not present

## 2019-07-04 DIAGNOSIS — N2581 Secondary hyperparathyroidism of renal origin: Secondary | ICD-10-CM | POA: Diagnosis not present

## 2019-07-04 DIAGNOSIS — N186 End stage renal disease: Secondary | ICD-10-CM | POA: Diagnosis not present

## 2019-07-04 DIAGNOSIS — D509 Iron deficiency anemia, unspecified: Secondary | ICD-10-CM | POA: Diagnosis not present

## 2019-07-05 DIAGNOSIS — N186 End stage renal disease: Secondary | ICD-10-CM | POA: Diagnosis not present

## 2019-07-05 DIAGNOSIS — Z992 Dependence on renal dialysis: Secondary | ICD-10-CM | POA: Diagnosis not present

## 2019-07-05 DIAGNOSIS — D631 Anemia in chronic kidney disease: Secondary | ICD-10-CM | POA: Diagnosis not present

## 2019-07-05 DIAGNOSIS — D509 Iron deficiency anemia, unspecified: Secondary | ICD-10-CM | POA: Diagnosis not present

## 2019-07-05 DIAGNOSIS — N2581 Secondary hyperparathyroidism of renal origin: Secondary | ICD-10-CM | POA: Diagnosis not present

## 2019-07-06 DIAGNOSIS — D509 Iron deficiency anemia, unspecified: Secondary | ICD-10-CM | POA: Diagnosis not present

## 2019-07-06 DIAGNOSIS — N2581 Secondary hyperparathyroidism of renal origin: Secondary | ICD-10-CM | POA: Diagnosis not present

## 2019-07-06 DIAGNOSIS — D631 Anemia in chronic kidney disease: Secondary | ICD-10-CM | POA: Diagnosis not present

## 2019-07-06 DIAGNOSIS — N186 End stage renal disease: Secondary | ICD-10-CM | POA: Diagnosis not present

## 2019-07-06 DIAGNOSIS — Z992 Dependence on renal dialysis: Secondary | ICD-10-CM | POA: Diagnosis not present

## 2019-07-07 DIAGNOSIS — N186 End stage renal disease: Secondary | ICD-10-CM | POA: Diagnosis not present

## 2019-07-07 DIAGNOSIS — Z992 Dependence on renal dialysis: Secondary | ICD-10-CM | POA: Diagnosis not present

## 2019-07-07 DIAGNOSIS — D631 Anemia in chronic kidney disease: Secondary | ICD-10-CM | POA: Diagnosis not present

## 2019-07-07 DIAGNOSIS — D509 Iron deficiency anemia, unspecified: Secondary | ICD-10-CM | POA: Diagnosis not present

## 2019-07-07 DIAGNOSIS — N2581 Secondary hyperparathyroidism of renal origin: Secondary | ICD-10-CM | POA: Diagnosis not present

## 2019-07-08 ENCOUNTER — Inpatient Hospital Stay (HOSPITAL_COMMUNITY)
Admission: EM | Admit: 2019-07-08 | Discharge: 2019-07-19 | DRG: 919 | Disposition: A | Discharge status: Home Health | Payer: Medicare Other | Source: Ambulatory Visit | Attending: Internal Medicine | Admitting: Internal Medicine

## 2019-07-08 ENCOUNTER — Other Ambulatory Visit: Payer: Self-pay

## 2019-07-08 DIAGNOSIS — R0602 Shortness of breath: Secondary | ICD-10-CM

## 2019-07-08 DIAGNOSIS — R188 Other ascites: Secondary | ICD-10-CM | POA: Diagnosis not present

## 2019-07-08 DIAGNOSIS — Z7722 Contact with and (suspected) exposure to environmental tobacco smoke (acute) (chronic): Secondary | ICD-10-CM | POA: Diagnosis present

## 2019-07-08 DIAGNOSIS — Z7982 Long term (current) use of aspirin: Secondary | ICD-10-CM

## 2019-07-08 DIAGNOSIS — G43909 Migraine, unspecified, not intractable, without status migrainosus: Secondary | ICD-10-CM | POA: Diagnosis not present

## 2019-07-08 DIAGNOSIS — D509 Iron deficiency anemia, unspecified: Secondary | ICD-10-CM | POA: Diagnosis not present

## 2019-07-08 DIAGNOSIS — D631 Anemia in chronic kidney disease: Secondary | ICD-10-CM | POA: Diagnosis present

## 2019-07-08 DIAGNOSIS — K659 Peritonitis, unspecified: Secondary | ICD-10-CM | POA: Diagnosis not present

## 2019-07-08 DIAGNOSIS — Z8679 Personal history of other diseases of the circulatory system: Secondary | ICD-10-CM

## 2019-07-08 DIAGNOSIS — Z992 Dependence on renal dialysis: Secondary | ICD-10-CM

## 2019-07-08 DIAGNOSIS — K658 Other peritonitis: Secondary | ICD-10-CM | POA: Diagnosis present

## 2019-07-08 DIAGNOSIS — E1122 Type 2 diabetes mellitus with diabetic chronic kidney disease: Secondary | ICD-10-CM | POA: Diagnosis present

## 2019-07-08 DIAGNOSIS — Z6841 Body Mass Index (BMI) 40.0 and over, adult: Secondary | ICD-10-CM

## 2019-07-08 DIAGNOSIS — Z79899 Other long term (current) drug therapy: Secondary | ICD-10-CM

## 2019-07-08 DIAGNOSIS — R1084 Generalized abdominal pain: Secondary | ICD-10-CM | POA: Diagnosis present

## 2019-07-08 DIAGNOSIS — Z89412 Acquired absence of left great toe: Secondary | ICD-10-CM

## 2019-07-08 DIAGNOSIS — K219 Gastro-esophageal reflux disease without esophagitis: Secondary | ICD-10-CM | POA: Diagnosis present

## 2019-07-08 DIAGNOSIS — D649 Anemia, unspecified: Secondary | ICD-10-CM

## 2019-07-08 DIAGNOSIS — B957 Other staphylococcus as the cause of diseases classified elsewhere: Secondary | ICD-10-CM | POA: Diagnosis present

## 2019-07-08 DIAGNOSIS — N186 End stage renal disease: Secondary | ICD-10-CM

## 2019-07-08 DIAGNOSIS — Z8701 Personal history of pneumonia (recurrent): Secondary | ICD-10-CM

## 2019-07-08 DIAGNOSIS — N2581 Secondary hyperparathyroidism of renal origin: Secondary | ICD-10-CM | POA: Diagnosis not present

## 2019-07-08 DIAGNOSIS — I959 Hypotension, unspecified: Secondary | ICD-10-CM | POA: Diagnosis not present

## 2019-07-08 DIAGNOSIS — Z8616 Personal history of COVID-19: Secondary | ICD-10-CM

## 2019-07-08 DIAGNOSIS — E8889 Other specified metabolic disorders: Secondary | ICD-10-CM | POA: Diagnosis present

## 2019-07-08 DIAGNOSIS — I160 Hypertensive urgency: Secondary | ICD-10-CM | POA: Diagnosis present

## 2019-07-08 DIAGNOSIS — K529 Noninfective gastroenteritis and colitis, unspecified: Secondary | ICD-10-CM | POA: Diagnosis present

## 2019-07-08 DIAGNOSIS — I10 Essential (primary) hypertension: Secondary | ICD-10-CM | POA: Diagnosis present

## 2019-07-08 DIAGNOSIS — Z96642 Presence of left artificial hip joint: Secondary | ICD-10-CM | POA: Diagnosis present

## 2019-07-08 DIAGNOSIS — Z9049 Acquired absence of other specified parts of digestive tract: Secondary | ICD-10-CM

## 2019-07-08 DIAGNOSIS — Z794 Long term (current) use of insulin: Secondary | ICD-10-CM

## 2019-07-08 DIAGNOSIS — Z03818 Encounter for observation for suspected exposure to other biological agents ruled out: Secondary | ICD-10-CM | POA: Diagnosis not present

## 2019-07-08 DIAGNOSIS — T8571XA Infection and inflammatory reaction due to peritoneal dialysis catheter, initial encounter: Secondary | ICD-10-CM | POA: Diagnosis not present

## 2019-07-08 DIAGNOSIS — E8779 Other fluid overload: Secondary | ICD-10-CM | POA: Diagnosis present

## 2019-07-08 DIAGNOSIS — Z8249 Family history of ischemic heart disease and other diseases of the circulatory system: Secondary | ICD-10-CM

## 2019-07-08 DIAGNOSIS — E11649 Type 2 diabetes mellitus with hypoglycemia without coma: Secondary | ICD-10-CM | POA: Diagnosis not present

## 2019-07-08 DIAGNOSIS — E669 Obesity, unspecified: Secondary | ICD-10-CM | POA: Diagnosis present

## 2019-07-08 DIAGNOSIS — I120 Hypertensive chronic kidney disease with stage 5 chronic kidney disease or end stage renal disease: Secondary | ICD-10-CM | POA: Diagnosis present

## 2019-07-08 DIAGNOSIS — IMO0002 Reserved for concepts with insufficient information to code with codable children: Secondary | ICD-10-CM | POA: Diagnosis present

## 2019-07-08 LAB — IRON AND TIBC
Iron: 18 ug/dL — ABNORMAL LOW (ref 28–170)
Saturation Ratios: 8 % — ABNORMAL LOW (ref 10.4–31.8)
TIBC: 213 ug/dL — ABNORMAL LOW (ref 250–450)
UIBC: 195 ug/dL

## 2019-07-08 LAB — COMPREHENSIVE METABOLIC PANEL
ALT: 13 U/L (ref 0–44)
AST: 11 U/L — ABNORMAL LOW (ref 15–41)
Albumin: 2.3 g/dL — ABNORMAL LOW (ref 3.5–5.0)
Alkaline Phosphatase: 57 U/L (ref 38–126)
Anion gap: 17 — ABNORMAL HIGH (ref 5–15)
BUN: 67 mg/dL — ABNORMAL HIGH (ref 6–20)
CO2: 27 mmol/L (ref 22–32)
Calcium: 8.5 mg/dL — ABNORMAL LOW (ref 8.9–10.3)
Chloride: 92 mmol/L — ABNORMAL LOW (ref 98–111)
Creatinine, Ser: 11.66 mg/dL — ABNORMAL HIGH (ref 0.44–1.00)
GFR calc Af Amer: 4 mL/min — ABNORMAL LOW (ref >60)
GFR calc non Af Amer: 3 mL/min — ABNORMAL LOW (ref >60)
Glucose, Bld: 180 mg/dL — ABNORMAL HIGH (ref 70–99)
Potassium: 3.7 mmol/L (ref 3.5–5.1)
Sodium: 136 mmol/L (ref 135–145)
Total Bilirubin: 0.8 mg/dL (ref 0.3–1.2)
Total Protein: 7.5 g/dL (ref 6.5–8.1)

## 2019-07-08 LAB — CBC
HCT: 22 % — ABNORMAL LOW (ref 36.0–46.0)
Hemoglobin: 6.5 g/dL — CL (ref 12.0–15.0)
MCH: 22 pg — ABNORMAL LOW (ref 26.0–34.0)
MCHC: 29.5 g/dL — ABNORMAL LOW (ref 30.0–36.0)
MCV: 74.6 fL — ABNORMAL LOW (ref 80.0–100.0)
Platelets: 248 10*3/uL (ref 150–400)
RBC: 2.95 MIL/uL — ABNORMAL LOW (ref 3.87–5.11)
RDW: 19.3 % — ABNORMAL HIGH (ref 11.5–15.5)
WBC: 7.2 10*3/uL (ref 4.0–10.5)
nRBC: 0 % (ref 0.0–0.2)

## 2019-07-08 LAB — POC OCCULT BLOOD, ED: Fecal Occult Bld: NEGATIVE

## 2019-07-08 LAB — PREPARE RBC (CROSSMATCH)

## 2019-07-08 LAB — RETICULOCYTES
Immature Retic Fract: 23.1 % — ABNORMAL HIGH (ref 2.3–15.9)
RBC.: 2.89 MIL/uL — ABNORMAL LOW (ref 3.87–5.11)
Retic Count, Absolute: 56.6 10*3/uL (ref 19.0–186.0)
Retic Ct Pct: 2 % (ref 0.4–3.1)

## 2019-07-08 LAB — FERRITIN: Ferritin: 717 ng/mL — ABNORMAL HIGH (ref 11–307)

## 2019-07-08 LAB — VITAMIN B12: Vitamin B-12: 478 pg/mL (ref 180–914)

## 2019-07-08 LAB — FOLATE: Folate: 8.2 ng/mL (ref >5.9)

## 2019-07-08 LAB — I-STAT BETA HCG BLOOD, ED (MC, WL, AP ONLY): I-stat hCG, quantitative: <5 m[IU]/mL (ref <5)

## 2019-07-08 MED ORDER — SODIUM CHLORIDE 0.9 % IV SOLN
10.0000 mL/h | Freq: Once | INTRAVENOUS | Status: AC
  Administered 2019-07-08: 10 mL/h via INTRAVENOUS

## 2019-07-08 NOTE — ED Notes (Signed)
RN received pt informed consent for blood transfusion.

## 2019-07-08 NOTE — ED Triage Notes (Signed)
Pt in POV, reports nephrologist sent her here d/t low Hgb - 6.3  Pt also reports inc in abd swelling. Dialysis pt - PD every day.

## 2019-07-08 NOTE — ED Provider Notes (Signed)
[facility] EMERGENCY DEPARTMENT Provider Note   CSN: [id number] Arrival date & time: 07/08/19  1925     History Chief Complaint  Patient presents with  . Abnormal Lab    Janet Mitchell is a 54 y.o. female.  The history is provided by the patient, the spouse and medical records.     54 year old female with a past medical history CHF, ESRD on peritoneal dialysis history of anemia requiring blood transfusion, HTN, type 2 diabetes presenting to the emerge department referred from her nephrologist with concern for significant anemia with a hemoglobin of 6.3 in the outpatient setting with associated increased abdominal swelling.  Patient reports chronically having fatigue, lightheadedness, generalized weakness.  She reports that she is required transfusions in the past.  She reports that she is checked monthly for her blood counts.  Patient denies any hematemesis, hematuria, vaginal bleeding, hematochezia or melena.  Patient denies any easy bruising.  Patient denies any fevers, chills, cough, congestion, rhinorrhea, shortness of breath, abdominal pain, changes of bowel or bladder function.  Patient does note increased lower extremity edema up to her waist is developed over the past several months.  Past Medical History:  Diagnosis Date  . Anemia   . CHF (congestive heart failure) ([place])   . Chronic cholecystitis with calculus   . ESRD (end stage renal disease) on dialysis [facility])    "peritoneal dialysis q hs" (03/09/2018)  . Headache    "a few/wk" (03/09/2018)  . History of blood transfusion 10/2017   "low blood count" (03/09/2018)  . Hypertension   . Spinal headache   . Type II diabetes mellitus [facility])     Patient Active Problem List   Diagnosis Date Noted  . Acute blood loss anemia   . Hyperkalemia   . Hypotension   . Vertigo   . Black stool 04/04/2019  . Uremia, acute   . Weakness   . COVID-19 03/26/2019  . Chronic diarrhea 03/25/2019  . Type II  diabetes mellitus ([place])   . Hypokalemia   . Hyponatremia   . Anemia in ESRD (end-stage renal disease) ([place])   . Essential hypertension, benign 02/14/2019  . Class 2 severe obesity due to excess calories with serious comorbidity and body mass index (BMI) of 37.0 to 37.9 in adult [facility]) 02/14/2019  . SBP (spontaneous bacterial peritonitis) ([place]) 12/18/2018  . Abdominal pain 11/28/2018  . Rectal burning 08/27/2018  . Rectal bleeding 08/27/2018  . Diarrhea 05/22/2018  . Fluid overload 03/25/2018  . Microcytic anemia 03/25/2018  . Chronic cholecystitis with calculus 03/09/2018  . Type 2 diabetes mellitus with ESRD (end-stage renal disease) ([place]) 03/09/2018  . Pre-transplant evaluation for ESRD (end stage renal disease) 12/19/2017  . Symptomatic anemia 11/06/2017  . Essential hypertension 11/06/2017  . ESRD on peritoneal dialysis ([place]) 11/06/2017  . Diabetic macular edema ([place]) 04/10/2015  . Edema of lower extremity 04/10/2015  . Uncontrolled type 2 diabetes mellitus ([place]) 04/10/2015    Past Surgical History:  Procedure Laterality Date  . AMPUTATION TOE Left 2013   Great toe  . BIOPSY  10/24/2018   Procedure: BIOPSY;  Surgeon: Daneil Dolin, MD;  Location[facility];  Service: Endoscopy;;  right and left colon  . CATARACT EXTRACTION W/ INTRAOCULAR LENS IMPLANT Right   . CESAREAN SECTION  1994; 1998  . CHOLECYSTECTOMY N/A 03/09/2018   Procedure: LAPAROSCOPIC CHOLECYSTECTOMY WITH INTRAOPERATIVE CHOLANGIOGRAM ERAS PATHWAY;  Surgeon: Donnie Mesa, MD;  Location: [facility];  Service: General;  Laterality: N/A;  .  COLONOSCOPY N/A 10/24/2018   Procedure: COLONOSCOPY;  Surgeon: Daneil Dolin, MD;  Location: [facility];  Service: Endoscopy;  Laterality: N/A;  1:00pm  . EYE SURGERY  05/15/2018   Removal of blood in the globe (due to DM)  . FLEXIBLE SIGMOIDOSCOPY N/A 11/24/2017   Procedure: FLEXIBLE SIGMOIDOSCOPY;  Surgeon: Daneil Dolin, MD;  Location[facility];  Service:  Endoscopy;  Laterality: N/A;  . LAPAROSCOPIC CHOLECYSTECTOMY  03/09/2018  . PERITONEAL CATHETER INSERTION  2017  . POLYPECTOMY  10/24/2018   Procedure: POLYPECTOMY;  Surgeon: Daneil Dolin, MD;  Location[facility];  Service: Endoscopy;;  . TOTAL HIP ARTHROPLASTY Left 1997     OB History    Gravida  2   Para  2   Term      Preterm      AB      Living  2     SAB      TAB      Ectopic      Multiple      Live Births              Family History  Problem Relation Age of Onset  . Heart disease Mother   . Thrombocytopenia Mother        TTP  . Heart failure Father   . Kidney disease Paternal Grandfather   . Colon cancer Neg Hx     Social History   Tobacco Use  . Smoking status: Passive Smoke Exposure - Never Smoker  . Smokeless tobacco: Never Used  Substance Use Topics  . Alcohol use: Never  . Drug use: Never    Home Medications Prior to Admission medications   Medication Sig Start Date End Date Taking? Authorizing Provider  acetaminophen (TYLENOL) 325 MG tablet Take 650 mg by mouth every 6 (six) hours as needed (pain).   Yes [provider]  aspirin EC 81 MG tablet Take 81 mg by mouth daily.   Yes [provider]  calcitRIOL (ROCALTROL) 0.5 MCG capsule Take 0.5-1 mcg by mouth See admin instructions. 0.81mg on Mondays through Fridays and take 116m on Saturdays and Sundays   Yes [provider]  calcium acetate (PHOSLO) 667 MG capsule Take 2 capsules (1,334 mg total) by mouth 3 (three) times daily with meals. 04/11/19  Yes GrNicole Kindred, DO  doxazosin (CARDURA) 8 MG tablet Take 8 mg by mouth daily.  05/06/19  Yes [provider]  insulin glargine, 2 Unit Dial, (TOUJEO MAX SOLOSTAR) 300 UNIT/ML Solostar Pen Inject 20 Units into the skin daily.   Yes [provider]  labetalol (NORMODYNE) 300 MG tablet Take 300 mg by mouth 2 (two) times daily.  05/06/19  Yes [provider]  losartan (COZAAR) 100 MG  tablet Take 100 mg by mouth daily.  05/06/19  Yes [provider]  multivitamin (RENA-VIT) TABS tablet Take 1 tablet by mouth at bedtime. 04/12/19  Yes GrNicole Kindred, DO  Blood Glucose Monitoring Suppl (ACCU-CHEK GUIDE) w/Device KIT 1 Piece by Does not apply route as directed. 02/28/19   NiCassandria AngerMD  cholestyramine (QUESTRAN) 4 g packet Take 1 packet (4 g total) by mouth 2 (two) times daily. Patient not taking: Reported on 07/08/2019 04/11/19   GrNicole Kindred, DO  glucose blood (ACCU-CHEK GUIDE) test strip Use as instructed 02/28/19   NiCassandria AngerMD  hyoscyamine (LEVBID) 0.375 MG 12 hr tablet Take 1 tablet (0.375 mg total) by mouth  every 12 (twelve) hours. Patient not taking: Reported on 07/08/2019 04/13/19   Nicole Kindred A, DO  Insulin Glargine, 2 Unit Dial, 300 UNIT/ML SOPN Inject 20 Units into the skin at bedtime. Patient not taking: Reported on 07/08/2019 02/28/19   Cassandria Anger, MD  Ipratropium-Albuterol (COMBIVENT) 20-100 MCG/ACT AERS respimat Inhale 2 puffs into the lungs every 6 (six) hours as needed for wheezing or shortness of breath. Patient not taking: Reported on 07/08/2019 03/30/19   Oswald Hillock, MD  pantoprazole (PROTONIX) 40 MG tablet Take 1 tablet (40 mg total) by mouth daily. Patient not taking: Reported on 07/08/2019 04/11/19 07/08/19  Nicole Kindred A, DO  potassium chloride SA (KLOR-CON) 20 MEQ tablet Take 2 tablets (40 mEq total) by mouth daily for 3 days. Patient not taking: Reported on 07/08/2019 04/13/19 07/08/19  Nicole Kindred A, DO    Allergies    Patient has no known allergies.  Review of Systems   Review of Systems  Constitutional: Positive for activity change and fatigue. Negative for appetite change, chills, diaphoresis and fever.  HENT: Negative for congestion and rhinorrhea.   Respiratory: Negative for cough, shortness of breath and wheezing.   Cardiovascular: Positive for leg swelling. Negative for chest pain.    Gastrointestinal: Negative for abdominal distention, abdominal pain, anal bleeding, blood in stool, diarrhea, nausea, rectal pain and vomiting.  Genitourinary: Negative for decreased urine volume, difficulty urinating, dysuria, frequency and urgency.  Musculoskeletal: Negative for gait problem.  Skin: Positive for pallor. Negative for color change and wound.  Neurological: Positive for light-headedness. Negative for dizziness, syncope, weakness and headaches.  All other systems reviewed and are negative.   Physical Exam Updated Vital Signs BP (!) 181/101   Pulse 78   Temp 98.1 F (36.7 C) (Oral)   Resp 13   SpO2 100%   Physical Exam Vitals and nursing note reviewed. Exam conducted with a chaperone present.  Constitutional:      General: She is not in acute distress.    Appearance: Normal appearance. She is normal weight. She is not ill-appearing.  HENT:     Head: Normocephalic.     Right Ear: External ear normal.     Left Ear: External ear normal.     Nose: Nose normal.     Mouth/Throat:     Mouth: Mucous membranes are moist.     Pharynx: Oropharynx is clear.  Eyes:     Extraocular Movements: Extraocular movements intact.     Comments: Conjunctival pallor  Cardiovascular:     Rate and Rhythm: Normal rate and regular rhythm.     Pulses: Normal pulses.     Heart sounds: Normal heart sounds.  Pulmonary:     Effort: Pulmonary effort is normal. No respiratory distress.     Breath sounds: Normal breath sounds. No wheezing or rhonchi.  Abdominal:     General: Bowel sounds are normal.     Palpations: Abdomen is soft.     Tenderness: There is no abdominal tenderness. There is no guarding.  Genitourinary:    Rectum: Normal. Guaiac result negative. No tenderness, external hemorrhoid or internal hemorrhoid. Normal anal tone.  Musculoskeletal:     Cervical back: Normal range of motion.     Right lower leg: Edema present.     Left lower leg: Edema present.     Comments: 2+  pitting edema up to the mid thigh  Skin:    General: Skin is warm and dry.     Capillary Refill:  Capillary refill takes less than 2 seconds.     Coloration: Skin is pale.  Neurological:     General: No focal deficit present.     Mental Status: She is alert and oriented to person, place, and time. Mental status is at baseline.  Psychiatric:        Mood and Affect: Mood normal.     ED Results / Procedures / Treatments   Labs (all labs ordered are listed, but only abnormal results are displayed) Labs Reviewed  COMPREHENSIVE METABOLIC PANEL - Abnormal; Notable for the following components:      Result Value   Chloride 92 (*)    Glucose, Bld 180 (*)    BUN 67 (*)    Creatinine, Ser 11.66 (*)    Calcium 8.5 (*)    Albumin 2.3 (*)    AST 11 (*)    GFR calc non Af Amer 3 (*)    GFR calc Af Amer 4 (*)    Anion gap 17 (*)    All other components within normal limits  CBC - Abnormal; Notable for the following components:   RBC 2.95 (*)    Hemoglobin 6.5 (*)    HCT 22.0 (*)    MCV 74.6 (*)    MCH 22.0 (*)    MCHC 29.5 (*)    RDW 19.3 (*)    All other components within normal limits  IRON AND TIBC - Abnormal; Notable for the following components:   Iron 18 (*)    TIBC 213 (*)    Saturation Ratios 8 (*)    All other components within normal limits  FERRITIN - Abnormal; Notable for the following components:   Ferritin 717 (*)    All other components within normal limits  RETICULOCYTES - Abnormal; Notable for the following components:   RBC. 2.89 (*)    Immature Retic Fract 23.1 (*)    All other components within normal limits  SARS CORONAVIRUS 2 (TAT 6-24 HRS)  VITAMIN B12  FOLATE  I-STAT BETA HCG BLOOD, ED ([facility], [facility], [facility] ONLY)  POC OCCULT BLOOD, ED  TYPE AND SCREEN  PREPARE RBC (CROSSMATCH)    EKG EKG Interpretation  Date/Time:  Monday July 08 2019 21:36:13 EDT Ventricular Rate:  79 PR Interval:    QRS Duration: 98 QT Interval:  431 QTC Calculation: 495 R  Axis:   -33 Text Interpretation: Sinus rhythm Left axis deviation Nonspecific T wave abnormality Borderline prolonged QT interval Confirmed by Lajean Saver [phone]) on 07/08/2019 9:56:46 PM   Radiology No results found.  Procedures Procedures (including critical care time)  Medications Ordered in ED Medications  0.9 %  sodium chloride infusion (10 mL/hr Intravenous New Bag/Given 07/08/19 2316)    ED Course  I have reviewed the triage vital signs and the nursing notes.  Pertinent labs & imaging results that were available during my care of the patient were reviewed by me and considered in my medical decision making (see chart for details).    MDM Rules/Calculators/A&P                      54 year old female with a past medical history CHF, ESRD on peritoneal dialysis history of anemia requiring blood transfusion, HTN, type 2 diabetes presenting to the emerge department referred from her nephrologist with concern for significant anemia with a hemoglobin of 6.5 in the outpatient setting with associated increased abdominal swelling.    Microcytic anemia secondary to iron deficiency anemia versus  occult GI bleed, menorrhagia or abnormal uterine bleeding, chronic renal dysfunction.  Patient noted to be hypertensive in the ED but has not taken her BP meds today. Patient's nephrologist is through [facility]   Initial interventions type and screen and transfuse 1 unit PRBCs  ECG interpreted by me demonstrated Sinus rhythm at 79 bpm, left axis deviation, LAFB, incomplete RBBB, low voltage QRS, QTC prolonged at 495 ms otherwise normal intervals T wave flattening in the high lateral leads, no other ST or T wave changes suggestive of acute ischemia overall similar EKG compared to previous on 03/31/2019  Labs demonstrated normal folate and vitamin B12 levels, iron diminished at 18 with a TIBC of 213, ferritin elevated 717, reticulocyte count appropriate 56.6,  Negative pregnancy test, negative fecal occult blood test, microcytic anemia with a hemoglobin of 6.5 and MCV of 74.6  Upon reassessment patient and husband feel uncomfortable with discharge given the patient's anemic symptoms and with extensive gradual onset progressive bilateral lower extremity edema  Given the above findings, my suspicion is that the patient warrants admission for monitoring of her blood counts and evaluation of her anemia  We will admit to hospitalist for for ongoing evaluation and management.   The plan for this patient was discussed with Dr. Ashok Cordia, who voiced agreement and who oversaw evaluation and treatment of this patient.  Final Clinical Impression(s) / ED Diagnoses Final diagnoses:  Symptomatic anemia    Rx / DC Orders ED Discharge Orders    None       Filbert Berthold, MD 07/08/19 2351    Lajean Saver, MD 07/09/19 1312

## 2019-07-09 ENCOUNTER — Observation Stay (HOSPITAL_COMMUNITY): Payer: Medicare Other

## 2019-07-09 ENCOUNTER — Other Ambulatory Visit (HOSPITAL_COMMUNITY): Payer: Medicare Other

## 2019-07-09 ENCOUNTER — Encounter (HOSPITAL_COMMUNITY): Payer: Self-pay | Admitting: Internal Medicine

## 2019-07-09 DIAGNOSIS — K659 Peritonitis, unspecified: Secondary | ICD-10-CM | POA: Diagnosis not present

## 2019-07-09 DIAGNOSIS — E8779 Other fluid overload: Secondary | ICD-10-CM | POA: Diagnosis present

## 2019-07-09 DIAGNOSIS — I5030 Unspecified diastolic (congestive) heart failure: Secondary | ICD-10-CM | POA: Diagnosis not present

## 2019-07-09 DIAGNOSIS — Z992 Dependence on renal dialysis: Secondary | ICD-10-CM | POA: Diagnosis not present

## 2019-07-09 DIAGNOSIS — I959 Hypotension, unspecified: Secondary | ICD-10-CM | POA: Diagnosis not present

## 2019-07-09 DIAGNOSIS — R1084 Generalized abdominal pain: Secondary | ICD-10-CM | POA: Diagnosis not present

## 2019-07-09 DIAGNOSIS — K219 Gastro-esophageal reflux disease without esophagitis: Secondary | ICD-10-CM | POA: Diagnosis present

## 2019-07-09 DIAGNOSIS — D649 Anemia, unspecified: Secondary | ICD-10-CM

## 2019-07-09 DIAGNOSIS — E11649 Type 2 diabetes mellitus with hypoglycemia without coma: Secondary | ICD-10-CM | POA: Diagnosis not present

## 2019-07-09 DIAGNOSIS — K529 Noninfective gastroenteritis and colitis, unspecified: Secondary | ICD-10-CM | POA: Diagnosis present

## 2019-07-09 DIAGNOSIS — Z8616 Personal history of COVID-19: Secondary | ICD-10-CM | POA: Diagnosis not present

## 2019-07-09 DIAGNOSIS — D631 Anemia in chronic kidney disease: Secondary | ICD-10-CM | POA: Diagnosis not present

## 2019-07-09 DIAGNOSIS — I160 Hypertensive urgency: Secondary | ICD-10-CM | POA: Diagnosis present

## 2019-07-09 DIAGNOSIS — I5023 Acute on chronic systolic (congestive) heart failure: Secondary | ICD-10-CM

## 2019-07-09 DIAGNOSIS — E1165 Type 2 diabetes mellitus with hyperglycemia: Secondary | ICD-10-CM | POA: Diagnosis not present

## 2019-07-09 DIAGNOSIS — Z03818 Encounter for observation for suspected exposure to other biological agents ruled out: Secondary | ICD-10-CM | POA: Diagnosis not present

## 2019-07-09 DIAGNOSIS — N186 End stage renal disease: Secondary | ICD-10-CM

## 2019-07-09 DIAGNOSIS — R188 Other ascites: Secondary | ICD-10-CM | POA: Diagnosis present

## 2019-07-09 DIAGNOSIS — T8571XA Infection and inflammatory reaction due to peritoneal dialysis catheter, initial encounter: Secondary | ICD-10-CM | POA: Diagnosis present

## 2019-07-09 DIAGNOSIS — Z4901 Encounter for fitting and adjustment of extracorporeal dialysis catheter: Secondary | ICD-10-CM | POA: Diagnosis not present

## 2019-07-09 DIAGNOSIS — I132 Hypertensive heart and chronic kidney disease with heart failure and with stage 5 chronic kidney disease, or end stage renal disease: Secondary | ICD-10-CM | POA: Diagnosis not present

## 2019-07-09 DIAGNOSIS — Z89412 Acquired absence of left great toe: Secondary | ICD-10-CM | POA: Diagnosis not present

## 2019-07-09 DIAGNOSIS — E669 Obesity, unspecified: Secondary | ICD-10-CM | POA: Diagnosis present

## 2019-07-09 DIAGNOSIS — E8770 Fluid overload, unspecified: Secondary | ICD-10-CM | POA: Diagnosis not present

## 2019-07-09 DIAGNOSIS — E1122 Type 2 diabetes mellitus with diabetic chronic kidney disease: Secondary | ICD-10-CM | POA: Diagnosis not present

## 2019-07-09 DIAGNOSIS — N185 Chronic kidney disease, stage 5: Secondary | ICD-10-CM | POA: Diagnosis not present

## 2019-07-09 DIAGNOSIS — Z794 Long term (current) use of insulin: Secondary | ICD-10-CM | POA: Diagnosis not present

## 2019-07-09 DIAGNOSIS — E1129 Type 2 diabetes mellitus with other diabetic kidney complication: Secondary | ICD-10-CM | POA: Diagnosis not present

## 2019-07-09 DIAGNOSIS — I10 Essential (primary) hypertension: Secondary | ICD-10-CM | POA: Diagnosis not present

## 2019-07-09 DIAGNOSIS — G43909 Migraine, unspecified, not intractable, without status migrainosus: Secondary | ICD-10-CM | POA: Diagnosis not present

## 2019-07-09 DIAGNOSIS — D509 Iron deficiency anemia, unspecified: Secondary | ICD-10-CM | POA: Diagnosis present

## 2019-07-09 DIAGNOSIS — K658 Other peritonitis: Secondary | ICD-10-CM | POA: Diagnosis present

## 2019-07-09 DIAGNOSIS — I120 Hypertensive chronic kidney disease with stage 5 chronic kidney disease or end stage renal disease: Secondary | ICD-10-CM | POA: Diagnosis present

## 2019-07-09 DIAGNOSIS — B957 Other staphylococcus as the cause of diseases classified elsewhere: Secondary | ICD-10-CM | POA: Diagnosis present

## 2019-07-09 DIAGNOSIS — N250 Renal osteodystrophy: Secondary | ICD-10-CM | POA: Diagnosis not present

## 2019-07-09 DIAGNOSIS — Z6841 Body Mass Index (BMI) 40.0 and over, adult: Secondary | ICD-10-CM | POA: Diagnosis not present

## 2019-07-09 DIAGNOSIS — Z96642 Presence of left artificial hip joint: Secondary | ICD-10-CM | POA: Diagnosis present

## 2019-07-09 HISTORY — PX: IR PARACENTESIS: IMG2679

## 2019-07-09 LAB — CBC
HCT: 23.4 % — ABNORMAL LOW (ref 36.0–46.0)
Hemoglobin: 7 g/dL — ABNORMAL LOW (ref 12.0–15.0)
MCH: 22.9 pg — ABNORMAL LOW (ref 26.0–34.0)
MCHC: 29.9 g/dL — ABNORMAL LOW (ref 30.0–36.0)
MCV: 76.5 fL — ABNORMAL LOW (ref 80.0–100.0)
Platelets: 232 K/uL (ref 150–400)
RBC: 3.06 MIL/uL — ABNORMAL LOW (ref 3.87–5.11)
RDW: 19.6 % — ABNORMAL HIGH (ref 11.5–15.5)
WBC: 7 K/uL (ref 4.0–10.5)
nRBC: 0 % (ref 0.0–0.2)

## 2019-07-09 LAB — C DIFFICILE QUICK SCREEN W PCR REFLEX
C Diff antigen: POSITIVE — AB
C Diff toxin: NEGATIVE

## 2019-07-09 LAB — GLUCOSE, CAPILLARY
Glucose-Capillary: 104 mg/dL — ABNORMAL HIGH (ref 70–99)
Glucose-Capillary: 122 mg/dL — ABNORMAL HIGH (ref 70–99)

## 2019-07-09 LAB — PROTEIN, PLEURAL OR PERITONEAL FLUID: Total protein, fluid: <3 g/dL

## 2019-07-09 LAB — ALBUMIN, PLEURAL OR PERITONEAL FLUID: Albumin, Fluid: <1 g/dL

## 2019-07-09 LAB — BODY FLUID CELL COUNT WITH DIFFERENTIAL
Eos, Fluid: 3 %
Lymphs, Fluid: 3 %
Monocyte-Macrophage-Serous Fluid: 11 % — ABNORMAL LOW (ref 50–90)
Neutrophil Count, Fluid: 83 % — ABNORMAL HIGH (ref 0–25)
Total Nucleated Cell Count, Fluid: 1270 cu mm — ABNORMAL HIGH (ref 0–1000)

## 2019-07-09 LAB — GRAM STAIN

## 2019-07-09 LAB — LACTATE DEHYDROGENASE, PLEURAL OR PERITONEAL FLUID: LD, Fluid: 25 U/L — ABNORMAL HIGH (ref 3–23)

## 2019-07-09 LAB — CLOSTRIDIUM DIFFICILE BY PCR, REFLEXED: Toxigenic C. Difficile by PCR: POSITIVE — AB

## 2019-07-09 LAB — CBG MONITORING, ED
Glucose-Capillary: 118 mg/dL — ABNORMAL HIGH (ref 70–99)
Glucose-Capillary: 126 mg/dL — ABNORMAL HIGH (ref 70–99)

## 2019-07-09 LAB — ECHOCARDIOGRAM COMPLETE

## 2019-07-09 LAB — SARS CORONAVIRUS 2 (TAT 6-24 HRS): SARS Coronavirus 2: NEGATIVE

## 2019-07-09 LAB — GLUCOSE, PLEURAL OR PERITONEAL FLUID: Glucose, Fluid: 144 mg/dL

## 2019-07-09 IMAGING — DX DG CHEST 1V PORT
2 series · 2 of 2 positions shown · non-contrast
Comparison: [DATE]

CLINICAL DATA: Short of breath

EXAM:
PORTABLE CHEST 1 VIEW

[chest ap (1 of 2)]
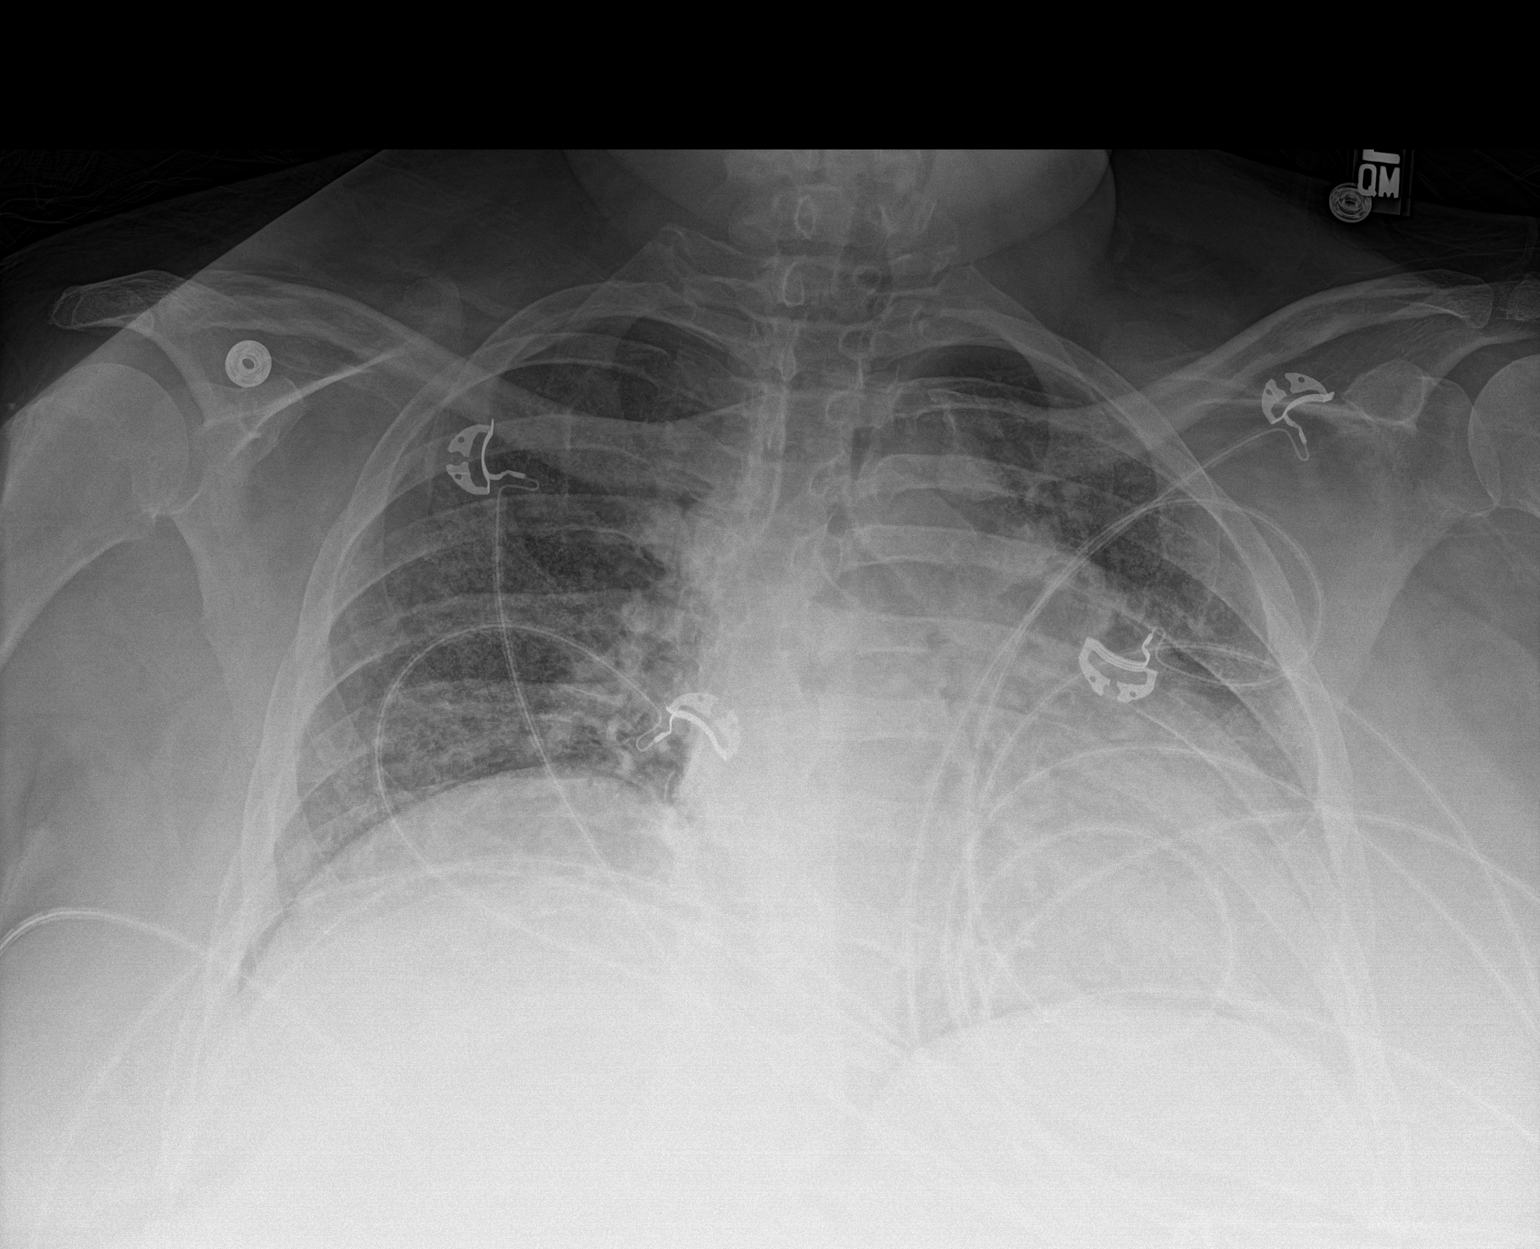

[chest ap (2 of 2)]
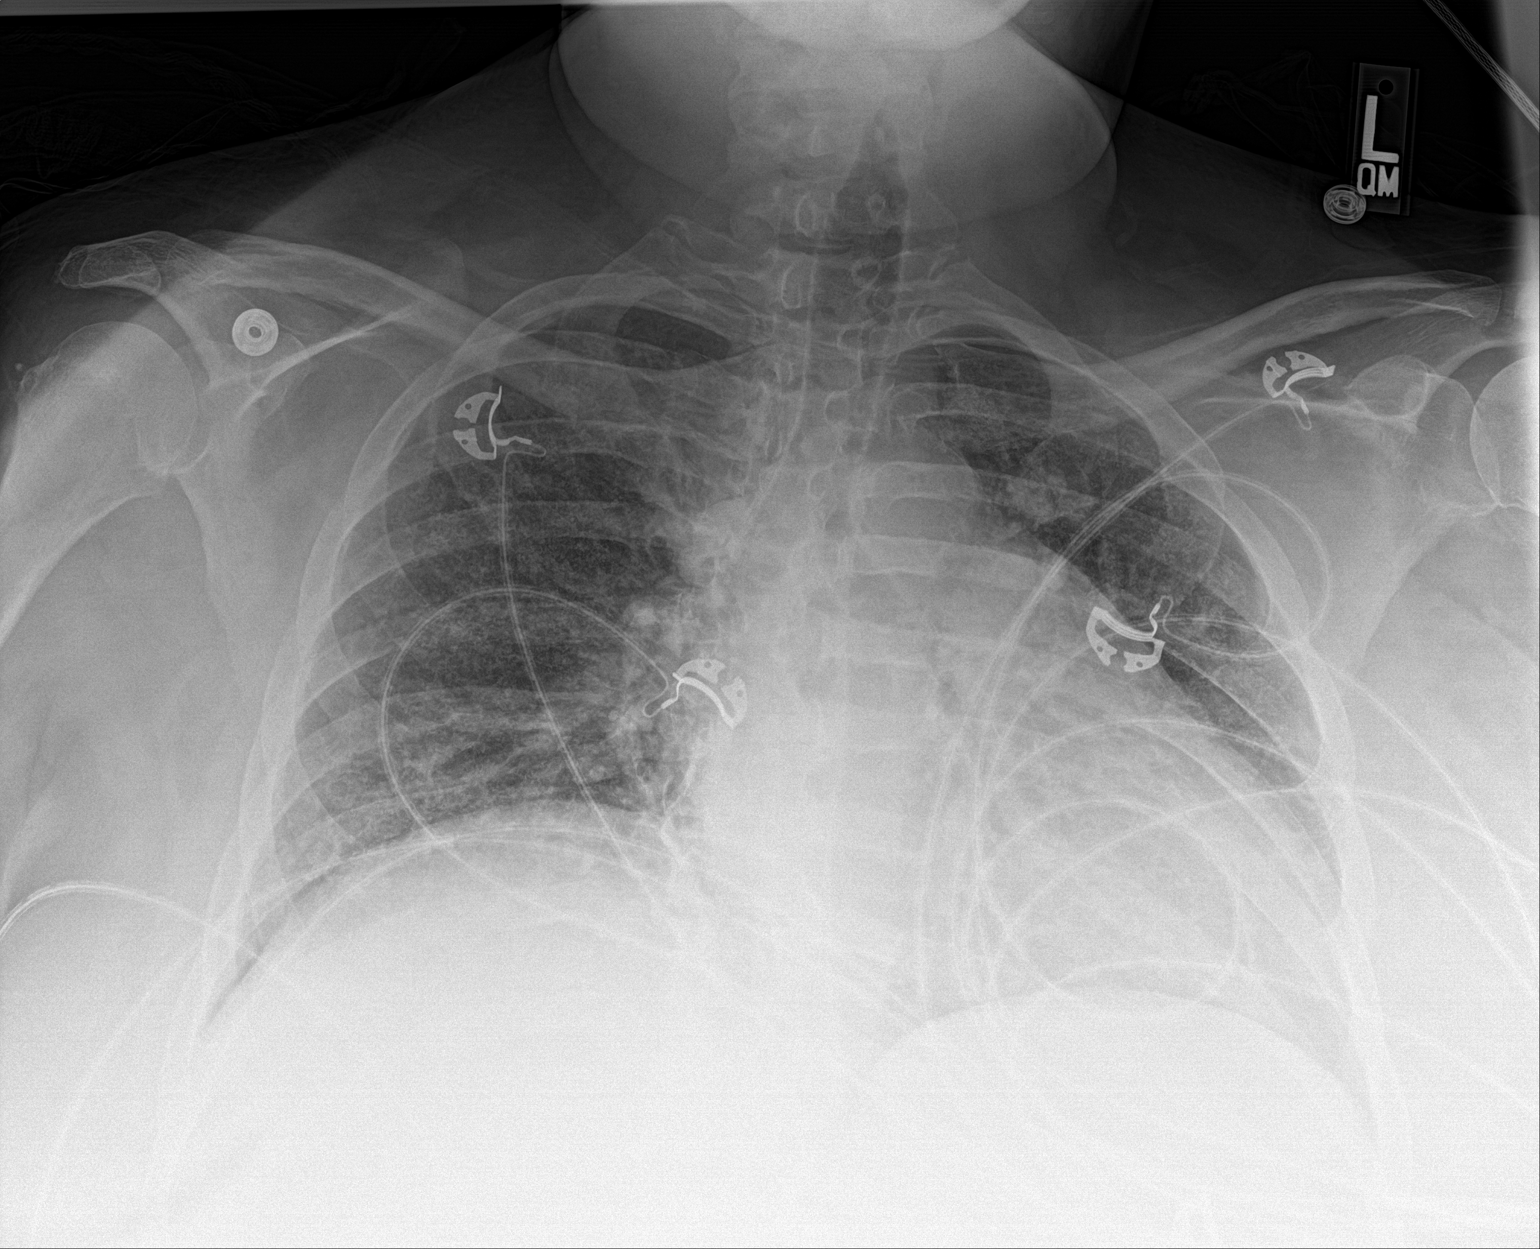

[2 of 2 positions shown; findings below may reference images not displayed]

FINDINGS: Low lung volumes. Cardiomegaly. Diffuse bilateral reticular and
ground-glass opacity. No pleural effusion or pneumothorax.
IMPRESSION: Cardiomegaly with mild diffuse fine reticular and ground-glass
opacity similar compared to prior, question underlying chronic
interstitial lung disease.

## 2019-07-09 IMAGING — CT CT ABD-PELV W/O CM
2 of 4 series · 17 of 46 positions shown, 19 images · non-contrast
Comparison: [DATE], [DATE]

CLINICAL DATA: Abdomen pain

EXAM:
CT ABDOMEN AND PELVIS WITHOUT CONTRAST
TECHNIQUE: Multidetector CT imaging of the abdomen and pelvis was performed
following the standard protocol without IV contrast.

[Series 3: a/p w/o 5mm · axial · non-contrast · 0.98mm/px · z∈[+824,+1294]mm · 14 of 104 slices shown, 16 images]
[im 5/104  soft-tissue]
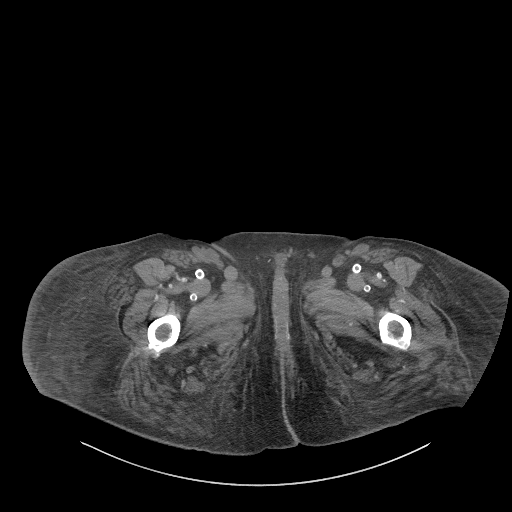
[im 5/104  bone]
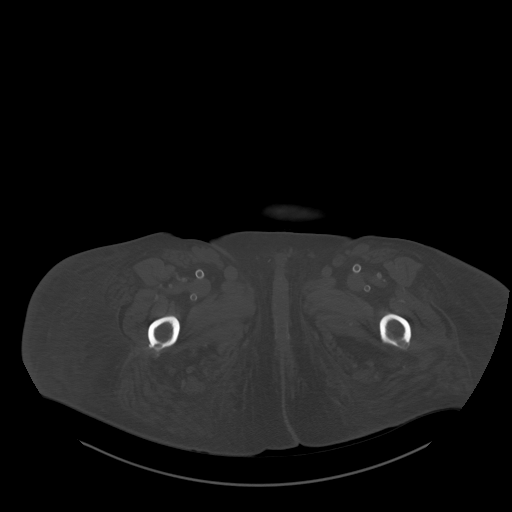
[im 13/104  soft-tissue]
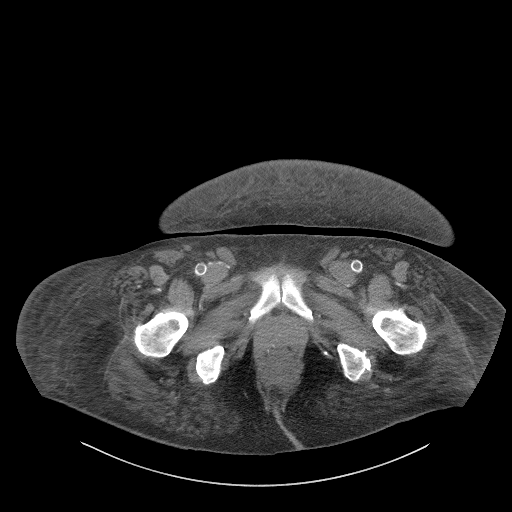
[im 22/104  soft-tissue]
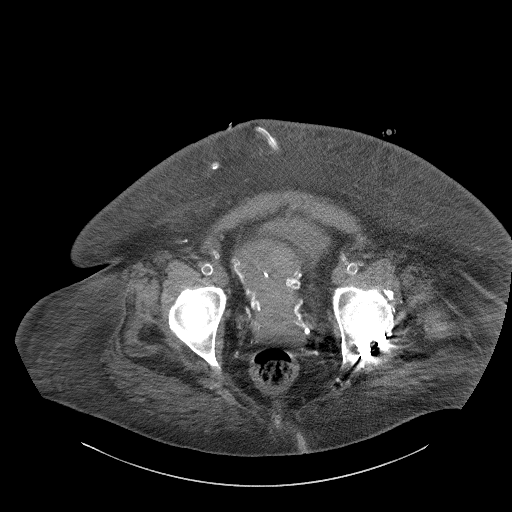
[im 26/104  soft-tissue]
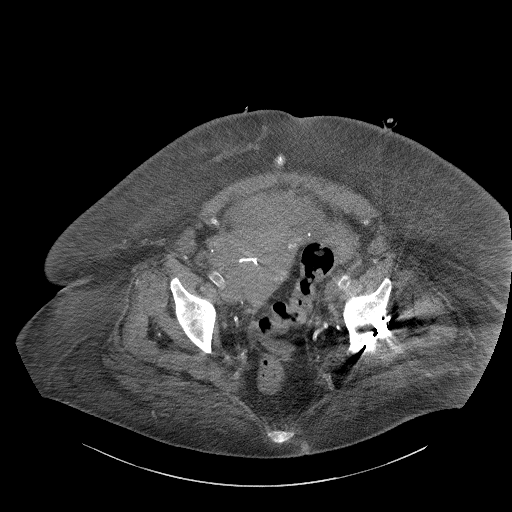
[im 35/104  soft-tissue]
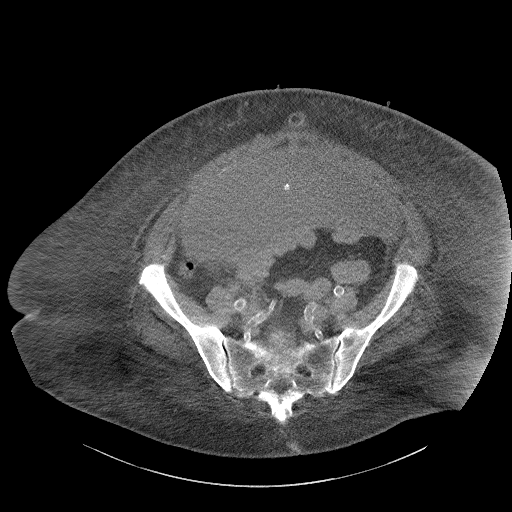
[im 43/104  soft-tissue]
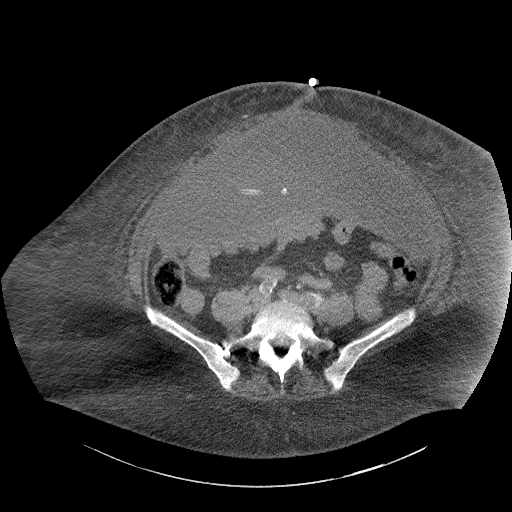
[im 48/104  soft-tissue]
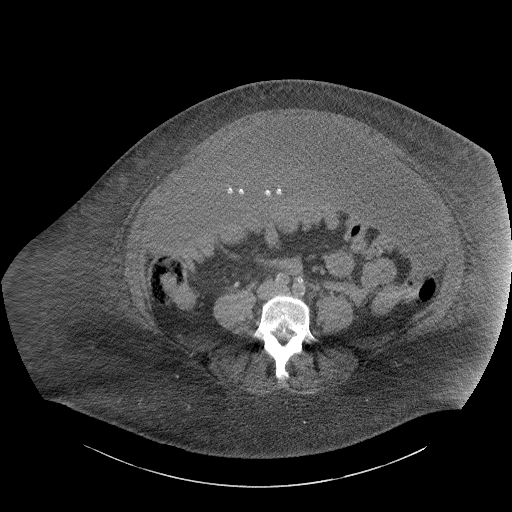
[im 56/104  soft-tissue]
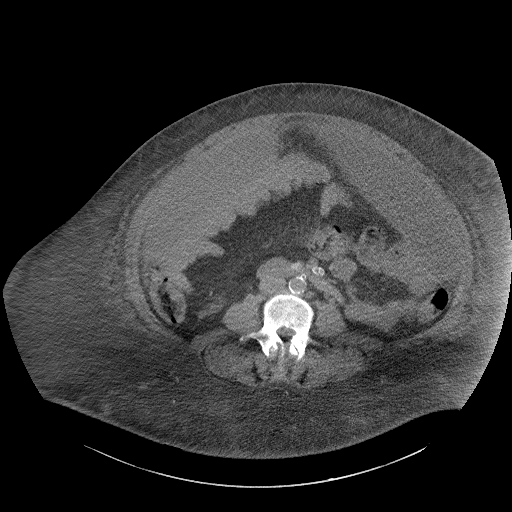
[im 61/104  soft-tissue]
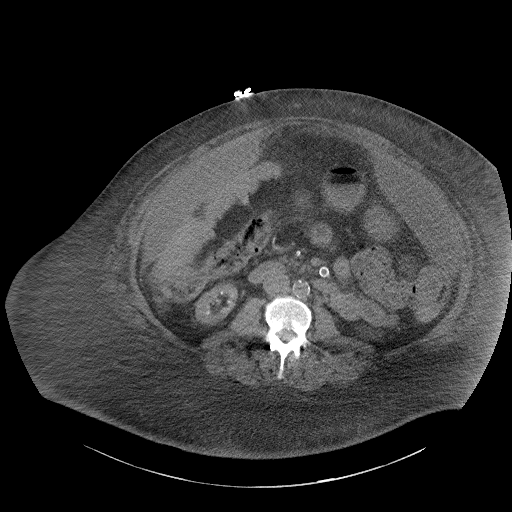
[im 61/104  bone]
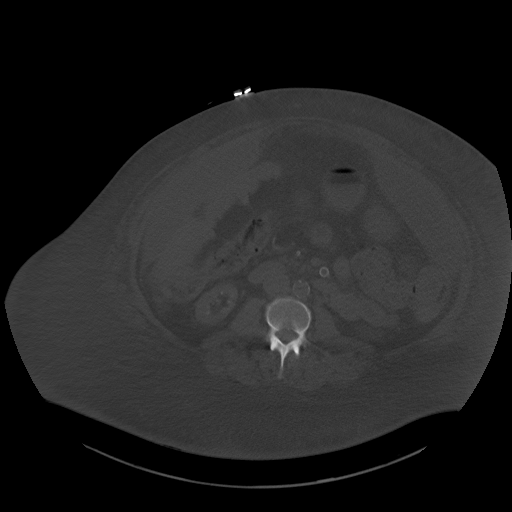
[im 69/104  soft-tissue]
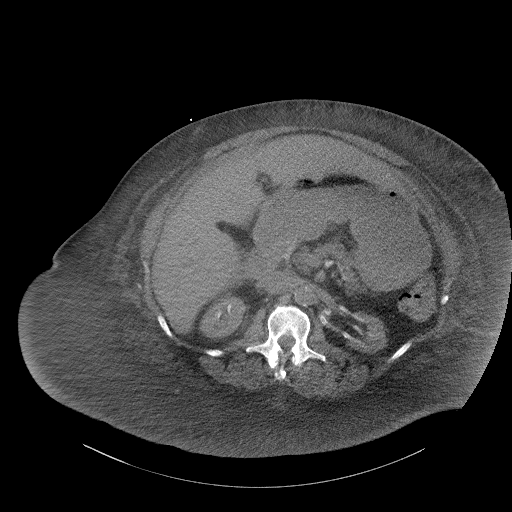
[im 78/104  soft-tissue]
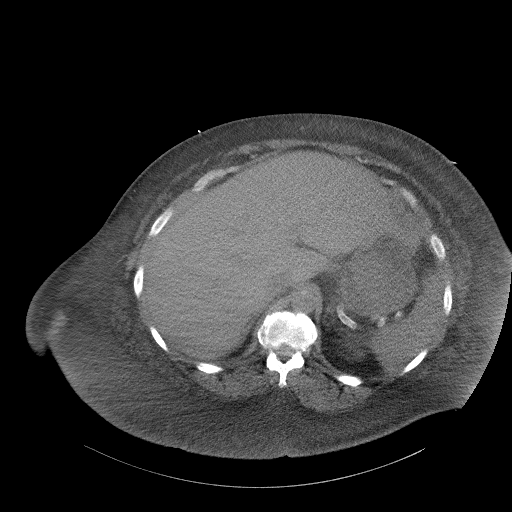
[im 82/104  soft-tissue]
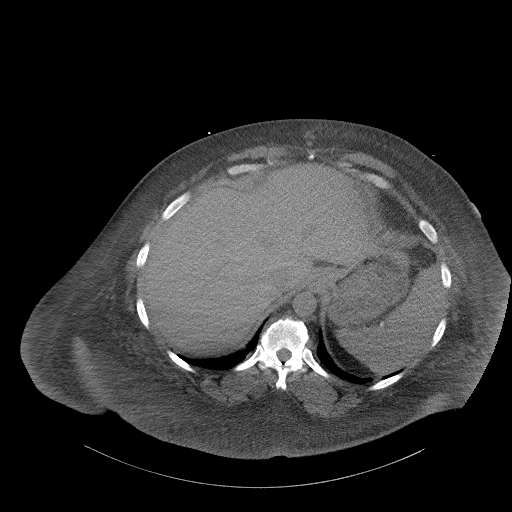
[im 91/104  soft-tissue]
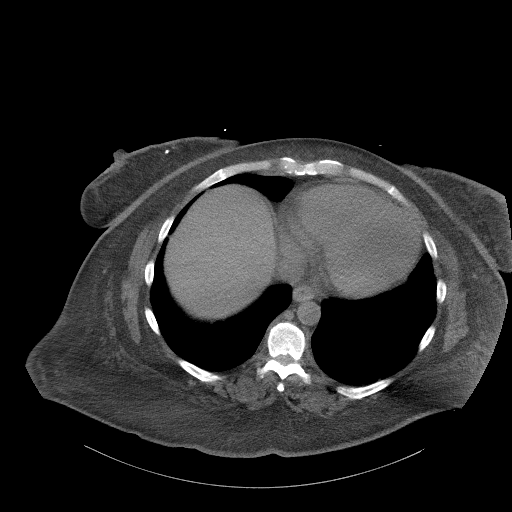
[im 99/104  soft-tissue]
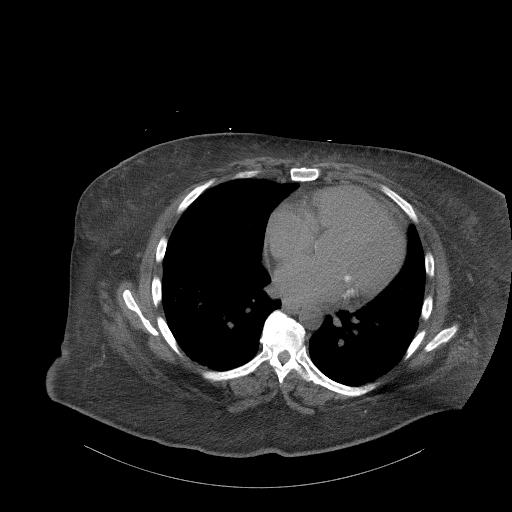

[Series 6: a/p w/o cor · coronal · non-contrast · 0.98mm/px · 3 of 184 slices shown]
[im 62/184  soft-tissue]
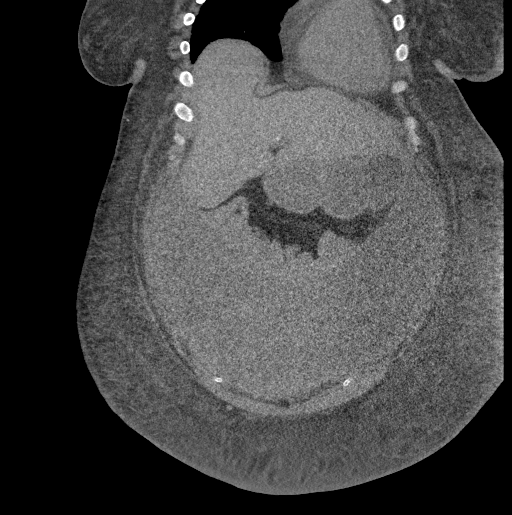
[im 82/184  soft-tissue]
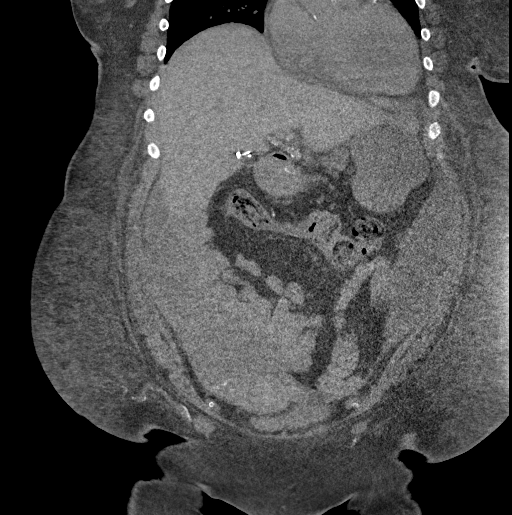
[im 102/184  soft-tissue]
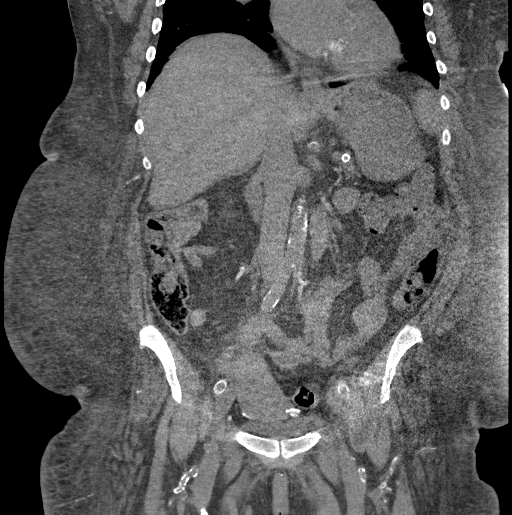

[17 of 46 positions shown; findings below may reference images not displayed]

FINDINGS: Lower chest: Lung bases demonstrate heterogeneous mostly peripheral
opacities likely scarring from prior COVID pneumonia. No
consolidation or pleural effusion. Coronary vascular calcification.
Mild cardiomegaly.

Hepatobiliary: No focal liver abnormality is seen. Status post
cholecystectomy. No biliary dilatation.

Pancreas: Unremarkable. No pancreatic ductal dilatation or
surrounding inflammatory changes.

Spleen: Normal in size without focal abnormality.

Adrenals/Urinary Tract: Adrenal glands are slightly thickened but
without dominant mass. Atrophic kidneys with extensive vascular
calcification. No hydronephrosis. Unremarkable urinary bladder

Stomach/Bowel: Stomach is within normal limits. Appendix appears
normal. No evidence of bowel wall thickening, distention, or
inflammatory changes.

Vascular/Lymphatic: Extensive aortic atherosclerosis. No aneurysm.
No suspicious adenopathy

Reproductive: IUD in the uterus

Other: No free air. Moderate to large loculated ascites within the
anterior abdominal cavity. Peritoneal dialysis catheter coiled
within the right anterior abdomen.

Musculoskeletal: No acute or suspicious osseous abnormality.
IMPRESSION: 1. Moderate to large volume of loculated appearing ascites within
the anterior abdominal cavity
2. Heterogenous peripheral densities at the bases felt secondary to
prior COVID pneumonia and scarring
3. Cardiomegaly.  Anasarca.  Atrophic native kidneys

## 2019-07-09 IMAGING — US IR PARACENTESIS
1 series · 3 of 3 positions shown · non-contrast
Comparison: none

INDICATION: Patient with history of end stage renal disease admitted with
abdominal pain and ascites presents for diagnostic paracentesis
maximum 100 ml per Team's request.

[Series 1: ir (id) (id)/(id)/(id) ir · 3 of 3 slices shown]
[im 1/3]
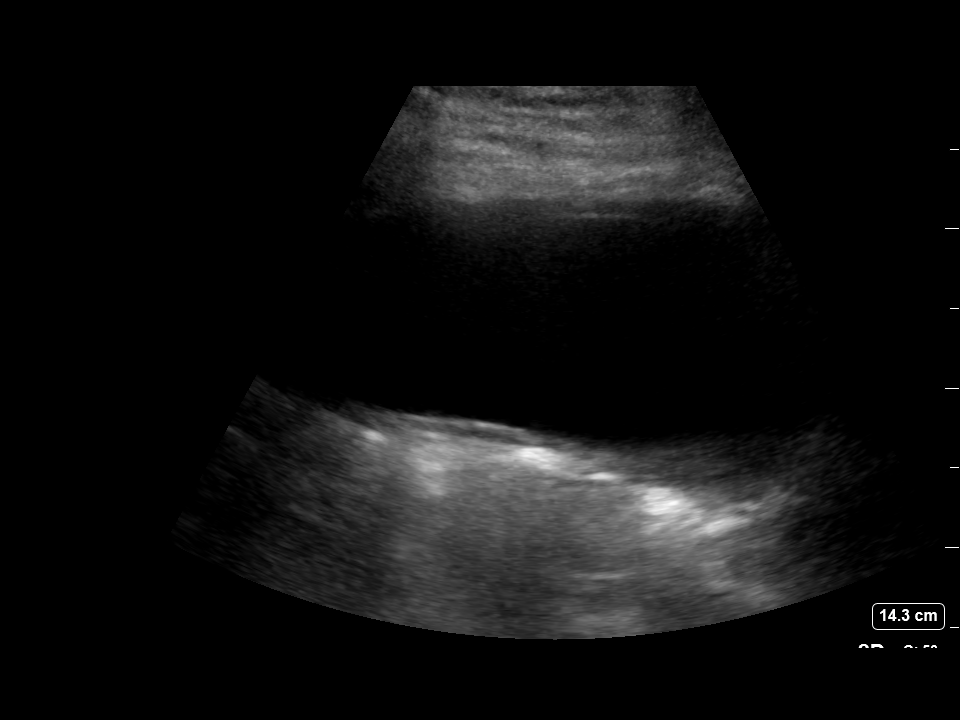
[im 2/3]
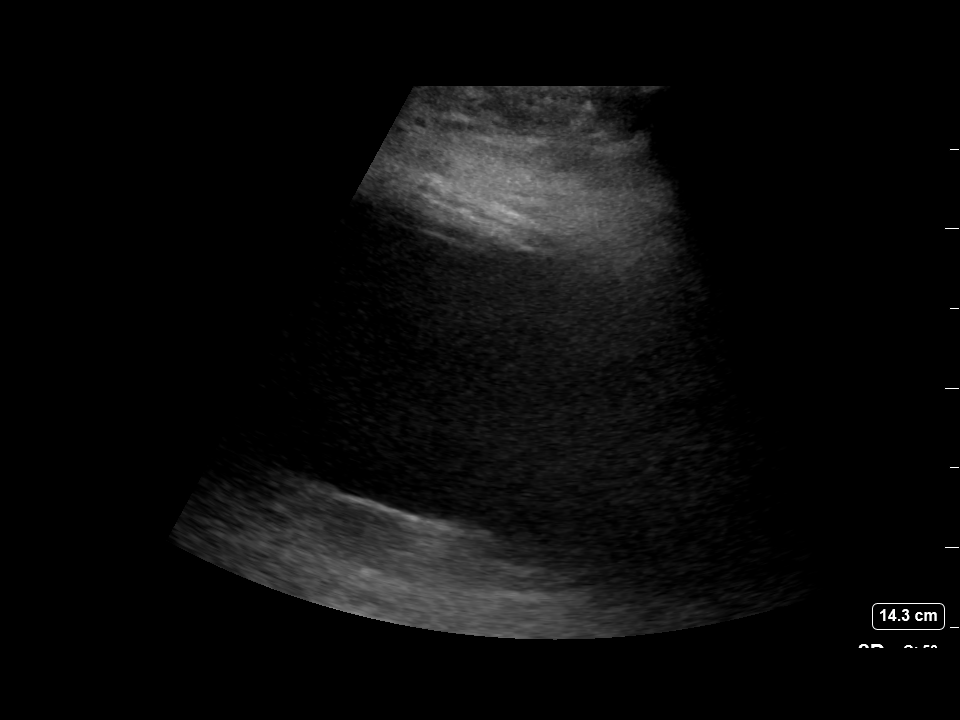
[im 3/3]
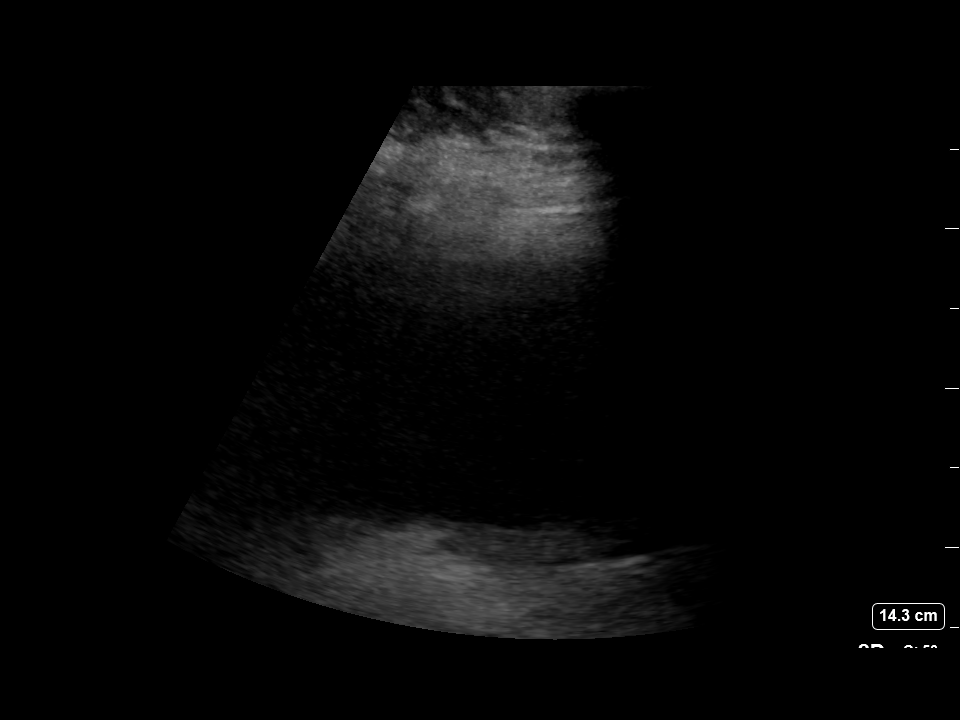

[3 of 3 positions shown; findings below may reference images not displayed]

EXAM:
ULTRASOUND GUIDED DIAGNOSTIC PARACENTESIS

MEDICATIONS:
Lidocaine 1% 10 mL

COMPLICATIONS:
None immediate.

PROCEDURE:
Informed written consent was obtained from the patient after a
discussion of the risks, benefits and alternatives to treatment. A
timeout was performed prior to the initiation of the procedure.

Initial ultrasound scanning demonstrates a large amount of ascites
within the right lower abdominal quadrant. The right lower abdomen
was prepped and draped in the usual sterile fashion. 1% lidocaine
was used for local anesthesia.

Following this, a 19 gauge, 10-cm, Yueh catheter was introduced. An
ultrasound image was saved for documentation purposes. The
paracentesis was performed. The catheter was removed and a dressing
was applied. The patient tolerated the procedure well without
immediate post procedural complication.
FINDINGS: A total of approximately 100 m of clear fluid was removed. Samples
were sent to the laboratory as requested by the clinical team.
IMPRESSION: Successful ultrasound-guided therapeutic paracentesis yielding 100
mililiters of peritoneal fluid.

Read by MANJARA NP

## 2019-07-09 MED ORDER — LIDOCAINE HCL (PF) 1 % IJ SOLN
INTRAMUSCULAR | Status: AC | PRN
  Administered 2019-07-09: 5 mL

## 2019-07-09 MED ORDER — SODIUM CHLORIDE 0.9 % IV SOLN
250.0000 mg | Freq: Every day | INTRAVENOUS | Status: DC
  Filled 2019-07-09: qty 20

## 2019-07-09 MED ORDER — ONDANSETRON HCL 4 MG PO TABS
4.0000 mg | ORAL_TABLET | Freq: Four times a day (QID) | ORAL | Status: DC | PRN
  Administered 2019-07-13 (×3): 4 mg via ORAL
  Filled 2019-07-09 (×4): qty 1

## 2019-07-09 MED ORDER — HYDRALAZINE HCL 20 MG/ML IJ SOLN
10.0000 mg | Freq: Four times a day (QID) | INTRAMUSCULAR | Status: DC | PRN
  Administered 2019-07-15 – 2019-07-16 (×2): 10 mg via INTRAVENOUS
  Filled 2019-07-09 (×4): qty 1

## 2019-07-09 MED ORDER — DARBEPOETIN ALFA 200 MCG/0.4ML IJ SOSY
200.0000 ug | PREFILLED_SYRINGE | Freq: Once | INTRAMUSCULAR | Status: AC
  Administered 2019-07-09: 200 ug via SUBCUTANEOUS
  Filled 2019-07-09: qty 0.4

## 2019-07-09 MED ORDER — ASPIRIN EC 81 MG PO TBEC
81.0000 mg | DELAYED_RELEASE_TABLET | Freq: Every day | ORAL | Status: DC
  Administered 2019-07-09 – 2019-07-19 (×11): 81 mg via ORAL
  Filled 2019-07-09 (×11): qty 1

## 2019-07-09 MED ORDER — ONDANSETRON HCL 4 MG/2ML IJ SOLN
4.0000 mg | Freq: Four times a day (QID) | INTRAMUSCULAR | Status: DC | PRN
  Administered 2019-07-10 – 2019-07-16 (×6): 4 mg via INTRAVENOUS
  Filled 2019-07-09 (×6): qty 2

## 2019-07-09 MED ORDER — LOSARTAN POTASSIUM 50 MG PO TABS
100.0000 mg | ORAL_TABLET | Freq: Every day | ORAL | Status: DC
  Administered 2019-07-09 – 2019-07-19 (×11): 100 mg via ORAL
  Filled 2019-07-09 (×11): qty 2

## 2019-07-09 MED ORDER — LIDOCAINE HCL 1 % IJ SOLN
INTRAMUSCULAR | Status: AC
  Filled 2019-07-09: qty 20

## 2019-07-09 MED ORDER — OXYCODONE-ACETAMINOPHEN 5-325 MG PO TABS
1.0000 | ORAL_TABLET | ORAL | Status: DC | PRN
  Administered 2019-07-10 – 2019-07-14 (×8): 1 via ORAL
  Filled 2019-07-09 (×10): qty 1

## 2019-07-09 MED ORDER — INSULIN ASPART 100 UNIT/ML ~~LOC~~ SOLN
0.0000 [IU] | Freq: Three times a day (TID) | SUBCUTANEOUS | Status: DC
  Administered 2019-07-09 – 2019-07-11 (×3): 1 [IU] via SUBCUTANEOUS
  Administered 2019-07-11 – 2019-07-12 (×3): 2 [IU] via SUBCUTANEOUS
  Administered 2019-07-12 – 2019-07-14 (×2): 1 [IU] via SUBCUTANEOUS

## 2019-07-09 MED ORDER — DELFLEX-LC/2.5% DEXTROSE 394 MOSM/L IP SOLN
5000.0000 mL | INTRAPERITONEAL | Status: DC
  Administered 2019-07-09 – 2019-07-13 (×3): 5000 mL via INTRAPERITONEAL

## 2019-07-09 MED ORDER — CALCIUM ACETATE (PHOS BINDER) 667 MG PO CAPS
1334.0000 mg | ORAL_CAPSULE | Freq: Three times a day (TID) | ORAL | Status: DC
  Administered 2019-07-09 – 2019-07-19 (×29): 1334 mg via ORAL
  Filled 2019-07-09 (×30): qty 2

## 2019-07-09 MED ORDER — SODIUM CHLORIDE 0.9 % IV SOLN: 2.0000 g | Freq: Once | INTRAVENOUS | Status: DC

## 2019-07-09 MED ORDER — POLYETHYLENE GLYCOL 3350 17 G PO PACK: 17.0000 g | PACK | Freq: Every day | ORAL | Status: DC | PRN

## 2019-07-09 MED ORDER — CALCITRIOL 0.25 MCG PO CAPS
1.0000 ug | ORAL_CAPSULE | ORAL | Status: DC
  Administered 2019-07-13 – 2019-07-14 (×2): 1 ug via ORAL
  Filled 2019-07-09 (×2): qty 4

## 2019-07-09 MED ORDER — VANCOMYCIN HCL 2000 MG/400ML IV SOLN
2000.0000 mg | Freq: Once | INTRAVENOUS | Status: AC
  Administered 2019-07-09: 2000 mg via INTRAVENOUS
  Filled 2019-07-09: qty 400

## 2019-07-09 MED ORDER — PANTOPRAZOLE SODIUM 40 MG PO TBEC
40.0000 mg | DELAYED_RELEASE_TABLET | Freq: Every day | ORAL | Status: DC
  Administered 2019-07-09 – 2019-07-19 (×11): 40 mg via ORAL
  Filled 2019-07-09 (×11): qty 1

## 2019-07-09 MED ORDER — DELFLEX-LC/4.25% DEXTROSE 483 MOSM/L IP SOLN
5000.0000 mL | INTRAPERITONEAL | Status: DC
  Administered 2019-07-09 – 2019-07-13 (×3): 5000 mL via INTRAPERITONEAL

## 2019-07-09 MED ORDER — HEPARIN 1000 UNIT/ML FOR PERITONEAL DIALYSIS
INTRAPERITONEAL | Status: DC | PRN
  Filled 2019-07-09 (×5): qty 5000

## 2019-07-09 MED ORDER — HEPARIN SODIUM (PORCINE) 5000 UNIT/ML IJ SOLN: 5000.0000 [IU] | Freq: Three times a day (TID) | INTRAMUSCULAR | Status: DC

## 2019-07-09 MED ORDER — SODIUM CHLORIDE 0.9 % IV SOLN
250.0000 mg | Freq: Every day | INTRAVENOUS | Status: AC
  Administered 2019-07-09 – 2019-07-12 (×4): 250 mg via INTRAVENOUS
  Filled 2019-07-09 (×5): qty 20

## 2019-07-09 MED ORDER — GENTAMICIN SULFATE 0.1 % EX CREA
1.0000 "application " | TOPICAL_CREAM | Freq: Every day | CUTANEOUS | Status: DC
  Administered 2019-07-10 – 2019-07-19 (×11): 1 via TOPICAL
  Filled 2019-07-09 (×2): qty 15

## 2019-07-09 MED ORDER — LABETALOL HCL 200 MG PO TABS
300.0000 mg | ORAL_TABLET | Freq: Two times a day (BID) | ORAL | Status: DC
  Administered 2019-07-09 – 2019-07-19 (×19): 300 mg via ORAL
  Filled 2019-07-09 (×2): qty 1
  Filled 2019-07-09: qty 2
  Filled 2019-07-09 (×14): qty 1
  Filled 2019-07-09: qty 2
  Filled 2019-07-09 (×4): qty 1

## 2019-07-09 MED ORDER — SODIUM CHLORIDE 0.9 % IV SOLN
2.0000 g | INTRAVENOUS | Status: DC
  Administered 2019-07-09 – 2019-07-10 (×2): 2 g via INTRAVENOUS
  Filled 2019-07-09 (×2): qty 20

## 2019-07-09 MED ORDER — CALCITRIOL 0.25 MCG PO CAPS
0.5000 ug | ORAL_CAPSULE | ORAL | Status: DC
  Administered 2019-07-09 – 2019-07-19 (×8): 0.5 ug via ORAL
  Filled 2019-07-09 (×6): qty 2
  Filled 2019-07-09: qty 1
  Filled 2019-07-09: qty 2

## 2019-07-09 MED ORDER — CALCITRIOL 0.5 MCG PO CAPS: 0.5000 ug | ORAL_CAPSULE | ORAL | Status: DC

## 2019-07-09 MED ORDER — INSULIN GLARGINE 100 UNIT/ML ~~LOC~~ SOLN
20.0000 [IU] | Freq: Every day | SUBCUTANEOUS | Status: DC
  Administered 2019-07-09 – 2019-07-14 (×6): 20 [IU] via SUBCUTANEOUS
  Filled 2019-07-09 (×6): qty 0.2

## 2019-07-09 MED ORDER — SODIUM CHLORIDE 0.9 % IV SOLN
2.0000 g | Freq: Once | INTRAVENOUS | Status: AC
  Administered 2019-07-09: 2 g via INTRAVENOUS
  Filled 2019-07-09: qty 2

## 2019-07-09 MED ORDER — HEPARIN 1000 UNIT/ML FOR PERITONEAL DIALYSIS: 500.0000 [IU] | INTRAMUSCULAR | Status: DC | PRN

## 2019-07-09 MED ORDER — VANCOMYCIN 50 MG/ML ORAL SOLUTION
125.0000 mg | Freq: Four times a day (QID) | ORAL | Status: DC
  Administered 2019-07-09 – 2019-07-10 (×4): 125 mg via ORAL
  Filled 2019-07-09 (×6): qty 2.5

## 2019-07-09 MED ORDER — DOXAZOSIN MESYLATE 8 MG PO TABS
8.0000 mg | ORAL_TABLET | Freq: Every day | ORAL | Status: DC
  Administered 2019-07-09: 8 mg via ORAL
  Filled 2019-07-09: qty 1

## 2019-07-09 NOTE — ED Notes (Signed)
Pt has had another loose yellow stool--  Admitting dr notified of pt's dialysis status- and 2-3+ pitting edema, and shortness of breath- he will contact dialysis MD

## 2019-07-09 NOTE — Progress Notes (Signed)
  Echocardiogram 2D Echocardiogram has been attempted.Nurse asked to hold off on Echo. Will reattempt at later time.  Ayva Veilleux G Maylen Waltermire 07/09/2019, 11:35 AM

## 2019-07-09 NOTE — ED Notes (Signed)
Notified pharmacy regarding ferric gluconate -- awaiting med.

## 2019-07-09 NOTE — ED Notes (Signed)
Breakfast ordered 

## 2019-07-09 NOTE — ED Notes (Signed)
Lunch Tray Ordered @ 1026.

## 2019-07-09 NOTE — ED Notes (Signed)
To IR via stretcher.

## 2019-07-09 NOTE — Plan of Care (Signed)
  Problem: Clinical Measurements: Goal: Will remain free from infection Outcome: Progressing  Enteric precautions for positive c diff result.

## 2019-07-09 NOTE — Consult Note (Signed)
Reason for Consult: To manage dialysis and dialysis related needs Referring Physician:  [name] is an 54 y.o. female with past medical history significant for obesity, hypertension, diabetes mellitus, CHF not otherwise specified. She also has ESRD is followed by Dr. Juleen [place] in [place] me that she has been on PD since 2017. Of late, she has had an issue with volume overload, is using higher powered fluids and is being monitored very closely. She also reports being dosed with Epogen last week. She presented for her regular follow-up, was not feeling badly but was discovered to have a low blood count so was directed to the emergency department. Hemoglobin of 6.5 with negative Hemoccult. She has been given 1 unit of blood-I do not see where hemoglobin has been rechecked. She is does not feel any differently. We are asked to provide peritoneal dialysis. She is at the issue of poor ultrafiltration of late. Her usual regimen is 5 cycles with volume of 2000-been using 2.5 and 4.25 fluid of late. Then she does not Extraneal last bag fill. Her last dialysis was Sunday night. They sampled her PD fluid-noted to have 1200 nucleated cells-83 neutrophils-fluid is cloudy-so peritonitis. Her albumin is 2.3   Dialyzes at [place] unknown. PD-5 cycles overnight dwell volume of 2000-using 2.5 and 4.25 of late- Extraneal last bag fill  Past Medical History:  Diagnosis Date  . Anemia   . CHF (congestive heart failure) ([place])   . Chronic cholecystitis with calculus   . COVID-19 virus infection 03/2019  . ESRD (end stage renal disease) on dialysis [facility])    "peritoneal dialysis q hs" (03/09/2018)  . Headache    "a few/wk" (03/09/2018)  . History of blood transfusion 10/2017   "low blood count" (03/09/2018)  . Hypertension   . Spinal headache   . Type II diabetes mellitus ([place])     Past Surgical History:  Procedure Laterality Date  . AMPUTATION TOE Left 2013   Great toe   . BIOPSY  10/24/2018   Procedure: BIOPSY;  Surgeon: Janet Dolin, MD;  [facility];  Service: Endoscopy;;  right and left colon  . CATARACT EXTRACTION W/ INTRAOCULAR LENS IMPLANT Right   . CESAREAN SECTION  1994; 1998  . CHOLECYSTECTOMY N/A 03/09/2018   Procedure: LAPAROSCOPIC CHOLECYSTECTOMY WITH INTRAOPERATIVE CHOLANGIOGRAM ERAS PATHWAY;  Surgeon: Janet Mesa, MD;  Location: [facility];  Service: General;  Laterality: N/A;  . COLONOSCOPY N/A 10/24/2018   Procedure: COLONOSCOPY;  Surgeon: Janet Dolin, MD;  [facility];  Service: Endoscopy;  Laterality: N/A;  1:00pm  . EYE SURGERY  05/15/2018   Removal of blood in the globe (due to DM)  . FLEXIBLE SIGMOIDOSCOPY N/A 11/24/2017   Procedure: FLEXIBLE SIGMOIDOSCOPY;  Surgeon: Janet Dolin, MD;  [facility];  Service: Endoscopy;  Laterality: N/A;  . IR PARACENTESIS  07/09/2019  . LAPAROSCOPIC CHOLECYSTECTOMY  03/09/2018  . PERITONEAL CATHETER INSERTION  2017  . POLYPECTOMY  10/24/2018   Procedure: POLYPECTOMY;  Surgeon: Janet Dolin, MD;  [facility];  Service: Endoscopy;;  . TOTAL HIP ARTHROPLASTY Left 1997    Family History  Problem Relation Age of Onset  . Heart disease Mother   . Thrombocytopenia Mother        TTP  . Heart failure Father   . Kidney disease Paternal Grandfather   . Colon cancer Neg Hx     Social History:  reports that she is a non-smoker  but has been exposed to tobacco smoke. She has never used smokeless tobacco. She reports that she does not drink alcohol or use drugs.  Allergies: No Known Allergies  Medications: I have reviewed the patient's current medications.   Results for orders placed or performed during the hospital encounter of 07/08/19 (from the past 48 hour(s))  Type and screen Real     Status: None (Preliminary result)   Collection Time: 07/08/19  7:45 PM  Result Value Ref Range   ABO/RH(D) A POS    Antibody Screen NEG     Sample Expiration 07/11/2019,2359    Unit Number [id number]    Blood Component Type RED CELLS,LR    Unit division 00    Status of Unit ISSUED,FINAL    Donor AG Type NEGATIVE FOR KELL ANTIGEN    Transfusion Status OK TO TRANSFUSE    Crossmatch Result COMPATIBLE    Unit Number [id number]    Blood Component Type RED CELLS,LR    Unit division 00    Status of Unit ALLOCATED    Donor AG Type NEGATIVE FOR KELL ANTIGEN    Transfusion Status OK TO TRANSFUSE    Crossmatch Result COMPATIBLE   Comprehensive metabolic panel     Status: Abnormal   Collection Time: 07/08/19  7:59 PM  Result Value Ref Range   Sodium 136 135 - 145 mmol/L   Potassium 3.7 3.5 - 5.1 mmol/L   Chloride 92 (L) 98 - 111 mmol/L   CO2 27 22 - 32 mmol/L   Glucose, Bld 180 (H) 70 - 99 mg/dL    Comment: Glucose reference range applies only to samples taken after fasting for at least 8 hours.   BUN 67 (H) 6 - 20 mg/dL   Creatinine, Ser 11.66 (H) 0.44 - 1.00 mg/dL   Calcium 8.5 (L) 8.9 - 10.3 mg/dL   Total Protein 7.5 6.5 - 8.1 g/dL   Albumin 2.3 (L) 3.5 - 5.0 g/dL   AST 11 (L) 15 - 41 U/L   ALT 13 0 - 44 U/L   Alkaline Phosphatase 57 38 - 126 U/L   Total Bilirubin 0.8 0.3 - 1.2 mg/dL   GFR calc non Af Amer 3 (L) >60 mL/min   GFR calc Af Amer 4 (L) >60 mL/min   Anion gap 17 (H) 5 - 15    Comment: Performed at [facility] [address] [place], [place] [address]  CBC     Status: Abnormal   Collection Time: 07/08/19  7:59 PM  Result Value Ref Range   WBC 7.2 4.0 - 10.5 K/uL   RBC 2.95 (L) 3.87 - 5.11 MIL/uL   Hemoglobin 6.5 (LL) 12.0 - 15.0 g/dL    Comment: REPEATED TO VERIFY Reticulocyte Hemoglobin testing may be clinically indicated, consider ordering this additional test [id number] THIS CRITICAL RESULT HAS VERIFIED AND BEEN CALLED TO C.GIBSON,RN BY PAMELA HENDERSON ON 04 12 2021 AT 2050, AND HAS BEEN READ BACK.     HCT 22.0 (L) 36.0 - 46.0 %   MCV 74.6 (L) 80.0 - 100.0 fL   MCH 22.0 (L) 26.0 -  34.0 pg   MCHC 29.5 (L) 30.0 - 36.0 g/dL   RDW 19.3 (H) 11.5 - 15.5 %   Platelets 248 150 - 400 K/uL    Comment: SPECIMEN CHECKED FOR CLOTS REPEATED TO VERIFY    nRBC 0.0 0.0 - 0.2 %    Comment: Performed at [facility]  [address], [place], [place] [address]  Vitamin B12     Status: None   Collection Time: 07/08/19  8:19 PM  Result Value Ref Range   Vitamin B-12 478 180 - 914 pg/mL    Comment: (NOTE) This assay is not validated for testing neonatal or myeloproliferative syndrome specimens for Vitamin B12 levels. Performed at [facility], [place] [address] [place], [place] [address]   Folate     Status: None   Collection Time: 07/08/19  8:19 PM  Result Value Ref Range   Folate 8.2 >5.9 ng/mL    Comment: Performed at [facility] [address] [place], [place] [address]  Iron and TIBC     Status: Abnormal   Collection Time: 07/08/19  8:19 PM  Result Value Ref Range   Iron 18 (L) 28 - 170 ug/dL   TIBC 213 (L) 250 - 450 ug/dL   Saturation Ratios 8 (L) 10.4 - 31.8 %   UIBC 195 ug/dL    Comment: Performed at [facility], [place] [address], [place], [place] [address]  Ferritin     Status: Abnormal   Collection Time: 07/08/19  8:19 PM  Result Value Ref Range   Ferritin 717 (H) 11 - 307 ng/mL    Comment: Performed at [facility], [place] [address], [place], [place] [address]  Reticulocytes     Status: Abnormal   Collection Time: 07/08/19  8:19 PM  Result Value Ref Range   Retic Ct Pct 2.0 0.4 - 3.1 %   RBC. 2.89 (L) 3.87 - 5.11 MIL/uL   Retic Count, Absolute 56.6 19.0 - 186.0 K/uL   Immature Retic Fract 23.1 (H) 2.3 - 15.9 %    Comment: Performed at [facility] [address], [place], [place] [address]  I-Stat beta hCG blood, ED     Status: None   Collection Time: 07/08/19  8:28 PM  Result Value Ref Range   I-stat hCG, quantitative <5.0 <5 mIU/mL   Comment 3            Comment:   GEST. AGE      CONC.  (mIU/mL)   <=1 WEEK        5 -  50     2 WEEKS       50 - 500     3 WEEKS       100 - 10,000     4 WEEKS     1,000 - 30,000        FEMALE AND NON-PREGNANT FEMALE:     LESS THAN 5 mIU/mL   POC occult blood, ED     Status: None   Collection Time: 07/08/19  9:48 PM  Result Value Ref Range   Fecal Occult Bld NEGATIVE NEGATIVE  Prepare RBC (crossmatch)     Status: None   Collection Time: 07/08/19  9:49 PM  Result Value Ref Range   Order Confirmation      ORDER PROCESSED BY BLOOD BANK Performed at [facility], [place] [address] [place], [place] [address]   SARS CORONAVIRUS 2 (TAT 6-24 HRS) Nasopharyngeal Nasopharyngeal Swab     Status: None   Collection Time: 07/08/19 10:04 PM   Specimen: Nasopharyngeal Swab  Result Value Ref Range   SARS Coronavirus 2 NEGATIVE NEGATIVE    Comment: (NOTE) SARS-CoV-2 target nucleic acids are NOT DETECTED. The SARS-CoV-2 RNA is generally detectable in upper and lower respiratory specimens during the acute  phase of infection. Negative results do not preclude SARS-CoV-2 infection, do not rule out co-infections with other pathogens, and should not be used as the sole basis for treatment or other patient management decisions. Negative results must be combined with clinical observations, patient history, and epidemiological information. The expected result is Negative. Fact Sheet for Patients: SugarRoll.be Fact Sheet for Healthcare Providers: https://www.woods-mathews.com/ This test is not yet approved or cleared by the [place] FDA and  has been authorized for detection and/or diagnosis of SARS-CoV-2 by FDA under an Emergency Use Authorization (EUA). This EUA will remain  in effect (meaning this test can be used) for the duration of the COVID-19 declaration under Section 56 4(b)(1) of the Act, 21 U.S.C. section 360bbb-3(b)(1), unless the authorization is terminated or revoked sooner. Performed at [facility], [place] [address], [place], [place] [address]   Lactate dehydrogenase (pleural or peritoneal fluid)     Status: Abnormal   Collection Time: 07/09/19  8:55 AM  Result Value Ref Range   LD, Fluid 25 (H) 3 - 23 U/L    Comment: (NOTE) Results should be evaluated in conjunction with serum values    Fluid Type-FLDH PERITONEAL     Comment: Performed at [facility], [place] [address], [place], [place] [address] CORRECTED ON 04/13 AT 1601: PREVIOUSLY REPORTED AS ABDOMEN   Body fluid cell count with differential     Status: Abnormal   Collection Time: 07/09/19  8:55 AM  Result Value Ref Range   Fluid Type-FCT PERITONEAL     Comment: CORRECTED ON 04/13 AT 0932: PREVIOUSLY REPORTED AS ABDOMEN   Color, Fluid STRAW (A) YELLOW   Appearance, Fluid CLOUDY (A) CLEAR   Total Nucleated Cell Count, Fluid 1,270 (H) 0 - 1,000 cu mm   Neutrophil Count, Fluid 83 (H) 0 - 25 %   Lymphs, Fluid 3 %   Monocyte-Macrophage-Serous Fluid 11 (L) 50 - 90 %   Eos, Fluid 3 %   Other Cells, Fluid Mesothelial cells present %    Comment: Performed at [facility], [place] [address], [place], [place] [address]  Albumin, pleural or peritoneal fluid     Status: None   Collection Time: 07/09/19  8:55 AM  Result Value Ref Range   Albumin, Fluid <1.0 g/dL   Fluid Type-FALB PERITONEAL     Comment: Performed at [facility], [place] [address], [place], [place] [address] CORRECTED ON 04/13 AT 4270: PREVIOUSLY REPORTED AS ABDOMEN   Protein, pleural or peritoneal fluid     Status: None   Collection Time: 07/09/19  8:55 AM  Result Value Ref Range   Total protein, fluid <3.0 g/dL    Comment: (NOTE) No normal range established for this test Results should be evaluated in conjunction with serum values    Fluid Type-FTP PERITONEAL     Comment: Performed at [facility] [address] [place], [place] [address] CORRECTED ON 04/13 AT 2831: PREVIOUSLY REPORTED AS ABDOMEN   Glucose, pleural or peritoneal fluid     Status: None    Collection Time: 07/09/19  8:55 AM  Result Value Ref Range   Glucose, Fluid 144 mg/dL    Comment: (NOTE) No normal range established for this test Results should be evaluated in conjunction with serum values    Fluid Type-FGLU PERITONEAL     Comment: Performed at [facility] [address] [place], [place] [address] CORRECTED ON 04/13 AT 6073: PREVIOUSLY REPORTED  AS ABDOMEN   CBG monitoring, ED     Status: Abnormal   Collection Time: 07/09/19  9:32 AM  Result Value Ref Range   Glucose-Capillary 118 (H) 70 - 99 mg/dL    Comment: Glucose reference range applies only to samples taken after fasting for at least 8 hours.   Comment 1 Notify RN    Comment 2 Document in Chart     CT ABDOMEN PELVIS WO CONTRAST  Result Date: 07/09/2019 CLINICAL DATA:  Abdomen pain EXAM: CT ABDOMEN AND PELVIS WITHOUT CONTRAST TECHNIQUE: Multidetector CT imaging of the abdomen and pelvis was performed following the standard protocol without IV contrast. COMPARISON:  05/27/2019, 04/08/2019 FINDINGS: Lower chest: Lung bases demonstrate heterogeneous mostly peripheral opacities likely scarring from prior COVID pneumonia. No consolidation or pleural effusion. Coronary vascular calcification. Mild cardiomegaly. Hepatobiliary: No focal liver abnormality is seen. Status post cholecystectomy. No biliary dilatation. Pancreas: Unremarkable. No pancreatic ductal dilatation or surrounding inflammatory changes. Spleen: Normal in size without focal abnormality. Adrenals/Urinary Tract: Adrenal glands are slightly thickened but without dominant mass. Atrophic kidneys with extensive vascular calcification. No hydronephrosis. Unremarkable urinary bladder Stomach/Bowel: Stomach is within normal limits. Appendix appears normal. No evidence of bowel wall thickening, distention, or inflammatory changes. Vascular/Lymphatic: Extensive aortic atherosclerosis. No aneurysm. No suspicious adenopathy Reproductive: IUD in the uterus Other: No  free air. Moderate to large loculated ascites within the anterior abdominal cavity. Peritoneal dialysis catheter coiled within the right anterior abdomen. Musculoskeletal: No acute or suspicious osseous abnormality. IMPRESSION: 1. Moderate to large volume of loculated appearing ascites within the anterior abdominal cavity 2. Heterogenous peripheral densities at the bases felt secondary to prior COVID pneumonia and scarring 3. Cardiomegaly.  Anasarca.  Atrophic native kidneys Electronically Signed   By: Donavan Foil M.D.   On: 07/09/2019 03:22   DG Chest Port 1 View  Result Date: 07/09/2019 CLINICAL DATA:  Short of breath EXAM: PORTABLE CHEST 1 VIEW COMPARISON:  04/02/2019 FINDINGS: Low lung volumes. Cardiomegaly. Diffuse bilateral reticular and ground-glass opacity. No pleural effusion or pneumothorax. IMPRESSION: Cardiomegaly with mild diffuse fine reticular and ground-glass opacity similar compared to prior, question underlying chronic interstitial lung disease. Electronically Signed   By: Donavan Foil M.D.   On: 07/09/2019 00:44   IR Paracentesis  Result Date: 07/09/2019 INDICATION: Patient with history of end stage renal disease admitted with abdominal pain and ascites presents for diagnostic paracentesis maximum 100 ml per Team's request. EXAM: ULTRASOUND GUIDED DIAGNOSTIC PARACENTESIS MEDICATIONS: Lidocaine 1% 10 mL COMPLICATIONS: None immediate. PROCEDURE: Informed written consent was obtained from the patient after a discussion of the risks, benefits and alternatives to treatment. A timeout was performed prior to the initiation of the procedure. Initial ultrasound scanning demonstrates a large amount of ascites within the right lower abdominal quadrant. The right lower abdomen was prepped and draped in the usual sterile fashion. 1% lidocaine was used for local anesthesia. Following this, a 19 gauge, 10-cm, Yueh catheter was introduced. An ultrasound image was saved for documentation purposes. The  paracentesis was performed. The catheter was removed and a dressing was applied. The patient tolerated the procedure well without immediate post procedural complication. FINDINGS: A total of approximately 100 m of clear fluid was removed. Samples were sent to the laboratory as requested by the clinical team. IMPRESSION: Successful ultrasound-guided therapeutic paracentesis yielding 100 mililiters of peritoneal fluid. Read by Rushie Nyhan NP Electronically Signed   By: Markus Daft M.D.   On: 07/09/2019 09:12    ROS: Actually says she  feels fine-her main complaint is of this volume overload which makes it difficult for her to walk around Blood pressure (!) 161/73, pulse 74, temperature 97.7 F (36.5 C), temperature source Oral, resp. rate 17, SpO2 96 %. General appearance: alert and cooperative Resp: diminished breath sounds bibasilar Cardio: regular rate and rhythm, S1, S2 normal, no murmur, click, rub or gallop GI: soft, non-tender; bowel sounds normal; no masses,  no organomegaly and Has abdominal wall edema, PD catheter in place is nontender Extremities: edema 2+ edema throughout Obese  Assessment/Plan: 54 year old black female with ESRD on PD-presenting with symptomatic anemia. However, found to have peritonitis and significant volume overload with low albumin 1 anemia-hemoglobin 6.5, she is actually asymptomatic. I do not have data to know what it was before. She was Hemoccult negative. She states that she received Epogen last week. Iron stores seem low. Even though she has an active infection I would like to treat her with IV iron and give her another dose of high-dose Aranesp to help build her blood counts. She required 1 unit of transfusion overnight and hgb has not been rechecked 2 ESRD: Says has been on PD for 4 years. Right now, her volume status is poorly controlled with an albumin of 2.3. She also appears to have peritonitis. I would like to try to do some rapid exchanges using 2.5  and 4.25 fluid in order to achieve some ultrafiltration and may help her to clear the infection as well. 3. Peritonitis-Gram stain did not show organism. Since I would like to do rapid exchanges I am going to do intravenous treatment for the peritonitis. Have written for 2 g of vancomycin and 2 g of Fortaz to be given today. Then will await culture data to dose regimen further 4 Hypertension: Hypertensive and volume overloaded. Rapid exchanges with higher osmolality fluids to achieve ultrafiltration. Her albumin is also causing third spacing. Unclear if she will need to convert from PD to hemo due to this issue 5. Metabolic Bone Disease: Continue home calcitriol and PhosLo   Janet [place] 07/09/2019, 1:26 PM

## 2019-07-09 NOTE — H&P (Addendum)
History and Physical    Janet Mitchell KJZ:[id number] DOB: June 18, 1965 DOA: 07/08/2019  PCP: Lucianne Lei, MD  Patient coming from: Home   Chief Complaint:  Chief Complaint  Patient presents with  . Abnormal Lab     HPI:   54 year old female with past medical history of end-stage renal disease on peritoneal hemodialysis, hypertension, diabetes mellitus type 2 who presents to [facility] emergency department after being sent by her nephrologist Dr. Juleen China due to a notably low hemoglobin and hematocrit as well as progressively worsening abdominal pain.  Patient explains that since February she has been experiencing worsening shortness of breath.  The shortness of breath is mild to moderate in intensity, worse with exertion and improved with rest.  Patient denies any associated chest pain, lightheadedness, palpitations.  Patient denies any cough fever, sick contacts.  Upon further questioning patient also complains of severe generalized abdominal pain.  Patient is very vague as to the onset of her abdominal pain, stating that it "always" hurts.  Patient reports that she performs peritoneal dialysis nightly.  Patient states that her abdominal pain is severe in intensity and worse with minimal movement.  Patient also states that she has some associated chronic diarrhea for which she follows with gastroenterology as an outpatient for.  Patient followed up with her nephrologist Dr. Juleen China earlier today for a regularly scheduled monthly appointment and routine labs, where patient was found to have markedly low hemoglobin with therefore was sent to the emergency department for evaluation.  Upon evaluation in the emergency department, 1 unit packed red blood cell transfusion was initiated.  A fecal Hemoccult test was performed and was found to be negative.  The hospitalist group was also called to assess the patient admission the hospital.    Review of Systems: A 10-system review of systems has  been performed and all systems are negative with the exception of what is listed in the HPI.   Past Medical History:  Diagnosis Date  . Anemia   . CHF (congestive heart failure) ([place])   . Chronic cholecystitis with calculus   . ESRD (end stage renal disease) on dialysis [facility])    "peritoneal dialysis q hs" (03/09/2018)  . Headache    "a few/wk" (03/09/2018)  . History of blood transfusion 10/2017   "low blood count" (03/09/2018)  . Hypertension   . Spinal headache   . Type II diabetes mellitus ([place])     Past Surgical History:  Procedure Laterality Date  . AMPUTATION TOE Left 2013   Great toe  . BIOPSY  10/24/2018   Procedure: BIOPSY;  Surgeon: Daneil Dolin, MD;  [facility];  Service: Endoscopy;;  right and left colon  . CATARACT EXTRACTION W/ INTRAOCULAR LENS IMPLANT Right   . CESAREAN SECTION  1994; 1998  . CHOLECYSTECTOMY N/A 03/09/2018   Procedure: LAPAROSCOPIC CHOLECYSTECTOMY WITH INTRAOPERATIVE CHOLANGIOGRAM ERAS PATHWAY;  Surgeon: Donnie Mesa, MD;  Location: [place];  Service: General;  Laterality: N/A;  . COLONOSCOPY N/A 10/24/2018   Procedure: COLONOSCOPY;  Surgeon: Daneil Dolin, MD;  Location[facility];  Service: Endoscopy;  Laterality: N/A;  1:00pm  . EYE SURGERY  05/15/2018   Removal of blood in the globe (due to DM)  . FLEXIBLE SIGMOIDOSCOPY N/A 11/24/2017   Procedure: FLEXIBLE SIGMOIDOSCOPY;  Surgeon: Daneil Dolin, MD;  [facility];  Service: Endoscopy;  Laterality: N/A;  . LAPAROSCOPIC CHOLECYSTECTOMY  03/09/2018  . PERITONEAL CATHETER INSERTION  2017  . POLYPECTOMY  10/24/2018   Procedure: POLYPECTOMY;  Surgeon: Daneil Dolin, MD;  Location[facility];  Service: Endoscopy;;  . TOTAL HIP ARTHROPLASTY Left 1997     reports that she is a non-smoker but has been exposed to tobacco smoke. She has never used smokeless tobacco. She reports that she does not drink alcohol or use drugs.  No Known Allergies  Family History    Problem Relation Age of Onset  . Heart disease Mother   . Thrombocytopenia Mother        TTP  . Heart failure Father   . Kidney disease Paternal Grandfather   . Colon cancer Neg Hx      Prior to Admission medications   Medication Sig Start Date End Date Taking? Authorizing Provider  acetaminophen (TYLENOL) 325 MG tablet Take 650 mg by mouth every 6 (six) hours as needed (pain).   Yes [provider]  aspirin EC 81 MG tablet Take 81 mg by mouth daily.   Yes [provider]  calcitRIOL (ROCALTROL) 0.5 MCG capsule Take 0.5-1 mcg by mouth See admin instructions. 0.52mg on Mondays through Fridays and take 145m on Saturdays and Sundays   Yes [provider]  calcium acetate (PHOSLO) 667 MG capsule Take 2 capsules (1,334 mg total) by mouth 3 (three) times daily with meals. 04/11/19  Yes GrNicole Kindred, DO  doxazosin (CARDURA) 8 MG tablet Take 8 mg by mouth daily.  05/06/19  Yes [provider]  insulin glargine, 2 Unit Dial, (TOUJEO MAX SOLOSTAR) 300 UNIT/ML Solostar Pen Inject 20 Units into the skin daily.   Yes [provider]  labetalol (NORMODYNE) 300 MG tablet Take 300 mg by mouth 2 (two) times daily.  05/06/19  Yes [provider]  losartan (COZAAR) 100 MG tablet Take 100 mg by mouth daily.  05/06/19  Yes [provider]  multivitamin (RENA-VIT) TABS tablet Take 1 tablet by mouth at bedtime. 04/12/19  Yes GrNicole Kindred, DO  Blood Glucose Monitoring Suppl (ACCU-CHEK GUIDE) w/Device KIT 1 Piece by Does not apply route as directed. 02/28/19   NiCassandria AngerMD  cholestyramine (QUESTRAN) 4 g packet Take 1 packet (4 g total) by mouth 2 (two) times daily. Patient not taking: Reported on 07/08/2019 04/11/19   GrNicole Kindred, DO  glucose blood (ACCU-CHEK GUIDE) test strip Use as instructed 02/28/19   NiCassandria AngerMD  hyoscyamine (LEVBID) 0.375 MG 12 hr tablet Take 1 tablet (0.375 mg total) by mouth every 12 (twelve)  hours. Patient not taking: Reported on 07/08/2019 04/13/19   GrNicole Kindred, DO  Insulin Glargine, 2 Unit Dial, 300 UNIT/ML SOPN Inject 20 Units into the skin at bedtime. Patient not taking: Reported on 07/08/2019 02/28/19   NiCassandria AngerMD  Ipratropium-Albuterol (COMBIVENT) 20-100 MCG/ACT AERS respimat Inhale 2 puffs into the lungs every 6 (six) hours as needed for wheezing or shortness of breath. Patient not taking: Reported on 07/08/2019 03/30/19   LaOswald HillockMD  pantoprazole (PROTONIX) 40 MG tablet Take 1 tablet (40 mg total) by mouth daily. Patient not taking: Reported on 07/08/2019 04/11/19 07/08/19  GrNicole Kindred, DO  potassium chloride SA (KLOR-CON) 20 MEQ tablet Take 2 tablets (40 mEq total) by mouth daily for 3 days. Patient not taking: Reported on 07/08/2019 04/13/19 07/08/19  GrEzekiel SlocumbDO    Physical Exam: Vitals:   07/08/19 2300 07/08/19 2325 07/08/19 2330 07/09/19 0000  BP: (!) 184/101 (!) 181/101 (!) 176/105 (!Marland Kitchen  184/105  Pulse: 77 78 78 77  Resp: _0 Temp:  98.1 F (36.7 C)    TempSrc:  Oral    SpO2: 99% 100% 99% 96%    Constitutional: Lethargic but arousable and oriented x3, patient is in mild distress due to abdominal pain. Skin: no rashes, no lesions, good skin turgor noted. Eyes: Pupils are equally reactive to light.  No evidence of scleral icterus or conjunctival pallor.  ENMT: Mucous membranes are moist. Posterior pharynx clear of any exudate or lesions. Normal dentition.   Neck: normal, supple, no masses, no thyromegaly Respiratory: Notable bibasilar rales.  No evidence of wheezing, Normal respiratory effort. No accessory muscle use.  Cardiovascular: Regular rate and rhythm, no murmurs / rubs / gallops. No extremity edema. 2+ pedal pulses. No carotid bruits.  Back:   Nontender without crepitus or deformity. Abdomen: Markedly distended abdomen with generalized tenderness.   No evidence of intra-abdominal masses.  Positive bowel sounds  noted in all quadrants.   Musculoskeletal: Extensive bilateral pitting lower extremity edema that tracks up to the lower back.  No joint deformity upper and lower extremities. Good ROM, no contractures. Normal muscle tone.  Neurologic: CN 2-12 grossly intact. Sensation intact, strength noted to be 5 out of 5 in all 4 extremities.  Patient is following all commands.  Patient is responsive to verbal stimuli.   Psychiatric: Depressed mood with flat affect.  Patient seems to possess insight as to theircurrent situation.     Labs on Admission: I have personally reviewed following labs and imaging studies -   CBC: Recent Labs  Lab 07/08/19 1959  WBC 7.2  HGB 6.5*  HCT 22.0*  MCV 74.6*  PLT 782   Basic Metabolic Panel: Recent Labs  Lab 07/08/19 1959  NA 136  K 3.7  CL 92*  CO2 27  GLUCOSE 180*  BUN 67*  CREATININE 11.66*  CALCIUM 8.5*   GFR: CrCl cannot be calculated (Unknown ideal weight.). Liver Function Tests: Recent Labs  Lab 07/08/19 1959  AST 11*  ALT 13  ALKPHOS 57  BILITOT 0.8  PROT 7.5  ALBUMIN 2.3*   No results for input(s): LIPASE, AMYLASE in the last 168 hours. No results for input(s): AMMONIA in the last 168 hours. Coagulation Profile: No results for input(s): INR, PROTIME in the last 168 hours. Cardiac Enzymes: No results for input(s): CKTOTAL, CKMB, CKMBINDEX, TROPONINI in the last 168 hours. BNP (last 3 results) No results for input(s): PROBNP in the last 8760 hours. HbA1C: No results for input(s): HGBA1C in the last 72 hours. CBG: No results for input(s): GLUCAP in the last 168 hours. Lipid Profile: No results for input(s): CHOL, HDL, LDLCALC, TRIG, CHOLHDL, LDLDIRECT in the last 72 hours. Thyroid Function Tests: No results for input(s): TSH, T4TOTAL, FREET4, T3FREE, THYROIDAB in the last 72 hours. Anemia Panel: Recent Labs    07/08/19 2019  VITAMINB12 478  FOLATE 8.2  FERRITIN 717*  TIBC 213*  IRON 18*  RETICCTPCT 2.0   Urine  analysis:    Component Value Date/Time   COLORURINE AMBER (A) 07/31/2018 1800   APPEARANCEUR TURBID (A) 07/31/2018 1800   LABSPEC >1.046 (H) 07/31/2018 1800   PHURINE 5.0 07/31/2018 1800   GLUCOSEU 150 (A) 07/31/2018 1800   HGBUR SMALL (A) 07/31/2018 1800   BILIRUBINUR NEGATIVE 07/31/2018 1800   KETONESUR NEGATIVE 07/31/2018 1800   PROTEINUR >=300 (A) 07/31/2018 1800   NITRITE NEGATIVE 07/31/2018 1800   LEUKOCYTESUR MODERATE (A) 07/31/2018 1800  Radiological Exams on Admission personally reviewed: DG Chest Port 1 View  Result Date: 07/09/2019 CLINICAL DATA:  Short of breath EXAM: PORTABLE CHEST 1 VIEW COMPARISON:  04/02/2019 FINDINGS: Low lung volumes. Cardiomegaly. Diffuse bilateral reticular and ground-glass opacity. No pleural effusion or pneumothorax. IMPRESSION: Cardiomegaly with mild diffuse fine reticular and ground-glass opacity similar compared to prior, question underlying chronic interstitial lung disease. Electronically Signed   By: Donavan Foil M.D.   On: 07/09/2019 00:44    EKG: Personally reviewed.  Rhythm is sinus rhythm with heart rate of 79.  No dynamic ST segment changes appreciated.  Assessment/Plan Principal Problem:   Symptomatic anemia  Likely multifactorial anemia with evidence of iron deficiency anemia superimposed on known anemia of chronic disease secondary to end-stage renal disease.  Fecal Hemoccult performed in the emergency department is negative  No evidence of gross bleeding on examination  Hemoglobin has decreased from 11.6 in early January to 6.5 on presentation  Per review of gastroenterology notes (Follows with Dr. Gala Romney / PA Gordy Levan) patient underwent colonoscopy in 2020 with 15 mm tubular adenoma identified  Patient receiving 1 unit packed red blood cell transfusion in the emergency department  Will monitor posttransfusion hemoglobin and hematocrit, perform serial CBCs every 6hrs x 2  We will consider formal gastroenterology  consultation in the morning   Active Problems:   Generalized abdominal pain   Patient exhibits impressive generalized abdominal tenderness on examination  Patient seems to be a poor historian concerning the onset and progression of the symptoms.  Review of both inpatient and outpatient notes since January revealed there was initial concern that the patient's abdominal pain is possibly secondary to underlying mesenteric ischemia prompting ordering of CTA of the abdomen and pelvis  CTA imaging of the abdomen and pelvis performed on 3/1 reveals no evidence of mesenteric ischemia but does reveal diffuse peritoneal thickening and enhancement concerning for developing peritonitis   Ordering ultrasound-guided paracentesis in the morning for cell count and culture  -it is possible that the patient may be suffering from secondary peritonitis as the patient does undergo peritoneal dialysis nightly  Will consider initiation of antibiotics based on these results   Additionally ordering repeat CT imaging of the abdomen and pelvis    essential hypertension   Continue home regimen of antihypertensives     ESRD on peritoneal dialysis [facility])   Patient reports performing peritoneal dialysis nightly  If patient is expected to remain hospitalized beyond tomorrow afternoon consider nephrology consultation  Considering significant peripheral edema and abdominal wall edema with ascites nearly to the point of anasarca patient should consider discussing possibility of transitioning to hemodialysis with her outpatient nephrologist.      Uncontrolled type II diabetes mellitus with chronic kidney disease ([place])   Accu-Cheks before every meal and nightly with sliding scale insulin  Obtaining hemoglobin A1c  Reordering home regimen of basal insulin therapy     Code Status:  Full code Family Communication: Deferred Disposition Plan: Patient is anticipated to be discharged to home once patient has  met maximum benefit from current hospitalization.   Consults called: Consider gastroenterology consultation in the morning.  Consider nephrology consultation tomorrow if patient is expected to remain in the hospital beyond the evening of 4/13. Admission status: Patient will be admitted to Observation and is anticipated to remain in the hospital for less than 2 midnights.   Vernelle Emerald MD [facility] Pager [phone]  If 7PM-7AM, please contact night-coverage [email or web address] Use universal [place] password for  that web site. If you do not have the password, please call the hospital operator.  07/09/2019, 2:10 AM

## 2019-07-09 NOTE — Progress Notes (Addendum)
PROGRESS NOTE    Janet Mitchell  CBU:[id number] DOB: [birth year] DOA: 07/08/2019 PCP: Lucianne Lei, MD   Brief Narrative:  HPI:   54 year old female with past medical history of end-stage renal disease on peritoneal hemodialysis, hypertension, diabetes mellitus type 2 who presents to [facility] emergency department after being sent by her nephrologist Dr. Juleen China due to a notably low hemoglobin and hematocrit as well as progressively worsening abdominal pain.  Patient explains that since February she has been experiencing worsening shortness of breath.  The shortness of breath is mild to moderate in intensity, worse with exertion and improved with rest.  Patient denies any associated chest pain, lightheadedness, palpitations.  Patient denies any cough fever, sick contacts.  Upon further questioning patient also complains of severe generalized abdominal pain.  Patient is very vague as to the onset of her abdominal pain, stating that it "always" hurts.  Patient reports that she performs peritoneal dialysis nightly.  Patient states that her abdominal pain is severe in intensity and worse with minimal movement.  Patient also states that she has some associated chronic diarrhea for which she follows with gastroenterology as an outpatient for.  Patient followed up with her nephrologist Dr. Juleen China earlier today for a regularly scheduled monthly appointment and routine labs, where patient was found to have markedly low hemoglobin with therefore was sent to the emergency department for evaluation.  Upon evaluation in the emergency department, 1 unit packed red blood cell transfusion was initiated.  A fecal Hemoccult test was performed and was found to be negative.  The hospitalist group was also called to assess the patient admission the hospital.  Assessment & Plan:   Principal Problem:   Symptomatic anemia Active Problems:   Essential hypertension   ESRD on peritoneal dialysis [facility])  Generalized abdominal pain   Uncontrolled type II diabetes mellitus with chronic kidney disease (HCC)   Symptomatic anemia: 5.  Received 1 unit of PRBC transfusion.  Follow-up CBC posttransfusion is still pending.  Generalized abdominal pain/ascites/?  SBP: Abdominal pain since about 3 months.  Intermittent.  Feels a little better after paracentesis but significantly tender on palpation.  Status post IR guided paracentesis.  Fluid analysis pending.  Some suspicion of possible SBP however her pain is chronic which makes it less likely so no empiric antibiotics until evidence of SBP.  History of Essential hypertension with hypertensive urgency: Presented with 192/71.  Currently 161/73.  Continue home medications which include doxazosin, labetalol and losartan.  Add as needed hydralazine.  ESRD on PD: Consulted nephrology.  Type 2 diabetes mellitus: Continue home dose of Lantus and SSI.  Diarrhea: We will check C. difficile.  DVT prophylaxis: SCD.  Holding heparin due to anemia.   Code Status: Full Code  Family Communication: None present at bedside.  Plan of care discussed with patient in length and he verbalized understanding and agreed with it. Patient is from: Home Disposition Plan: Home Barriers to discharge: Symptomatic anemia and severe abdominal pain  Status is: Observation  The patient will require care spanning > 2 midnights and should be moved to inpatient because: Ongoing active pain requiring inpatient pain management  Dispo: The patient is from: Home              Anticipated d/c is to: Home              Anticipated d/c date is: 1 day              Patient currently is not  medically stable to d/c.        Estimated body mass index is 38.12 kg/m as calculated from the following:   Height as of 05/01/19: 5\' 8"  (1.727 m).   Weight as of 04/11/19: 113.7 kg.      Nutritional status:               Consultants:   Nephrology  Procedures:    Paracentesis  Antimicrobials:  Anti-infectives (From admission, onward)   None         Subjective: Seen and examined the ED in the hallway.  Still with abdominal pain but slightly better since paracentesis.  Also complains of having couple of episodes of loose bowel movement.  No other complaint.  Objective: Vitals:   07/09/19 0846 07/09/19 1200 07/09/19 1215 07/09/19 1230  BP: (!) 161/73     Pulse:  76 76 74  Resp:  (!) 22 (!) 23 17  Temp:      TempSrc:      SpO2:  100% 97% 96%    Intake/Output Summary (Last 24 hours) at 07/09/2019 1241 Last data filed at 07/09/2019 0847 Gross per 24 hour  Intake 315 ml  Output 100 ml  Net 215 ml   There were no vitals filed for this visit.  Examination:  General exam: Appears calm but in pain Respiratory system: Clear to auscultation with diminished breath sounds at the bases. Respiratory effort normal. Cardiovascular system: S1 & S2 heard, RRR. No JVD, murmurs, rubs, gallops or clicks.  +4 pitting edema bilateral lower extremity Gastrointestinal system: Abdomen is nondistended, soft and nontender. No organomegaly or masses felt. Normal bowel sounds heard. Central nervous system: Alert and oriented. No focal neurological deficits. Extremities: Symmetric 5 x 5 power. Skin: No rashes, lesions or ulcers Psychiatry: Judgement and insight appear normal. Mood & affect appropriate.    Data Reviewed: I have personally reviewed following labs and imaging studies  CBC: Recent Labs  Lab 07/08/19 1959  WBC 7.2  HGB 6.5*  HCT 22.0*  MCV 74.6*  PLT 408   Basic Metabolic Panel: Recent Labs  Lab 07/08/19 1959  NA 136  K 3.7  CL 92*  CO2 27  GLUCOSE 180*  BUN 67*  CREATININE 11.66*  CALCIUM 8.5*   GFR: CrCl cannot be calculated (Unknown ideal weight.). Liver Function Tests: Recent Labs  Lab 07/08/19 1959  AST 11*  ALT 13  ALKPHOS 57  BILITOT 0.8  PROT 7.5  ALBUMIN 2.3*   No results for input(s): LIPASE, AMYLASE in  the last 168 hours. No results for input(s): AMMONIA in the last 168 hours. Coagulation Profile: No results for input(s): INR, PROTIME in the last 168 hours. Cardiac Enzymes: No results for input(s): CKTOTAL, CKMB, CKMBINDEX, TROPONINI in the last 168 hours. BNP (last 3 results) No results for input(s): PROBNP in the last 8760 hours. HbA1C: No results for input(s): HGBA1C in the last 72 hours. CBG: Recent Labs  Lab 07/09/19 0932  GLUCAP 118*   Lipid Profile: No results for input(s): CHOL, HDL, LDLCALC, TRIG, CHOLHDL, LDLDIRECT in the last 72 hours. Thyroid Function Tests: No results for input(s): TSH, T4TOTAL, FREET4, T3FREE, THYROIDAB in the last 72 hours. Anemia Panel: Recent Labs    07/08/19 2019  VITAMINB12 478  FOLATE 8.2  FERRITIN 717*  TIBC 213*  IRON 18*  RETICCTPCT 2.0   Sepsis Labs: No results for input(s): PROCALCITON, LATICACIDVEN in the last 168 hours.  Recent Results (from the past 240 hour(s))  SARS CORONAVIRUS  2 (TAT 6-24 HRS) Nasopharyngeal Nasopharyngeal Swab     Status: None   Collection Time: 07/08/19 10:04 PM   Specimen: Nasopharyngeal Swab  Result Value Ref Range Status   SARS Coronavirus 2 NEGATIVE NEGATIVE Final    Comment: (NOTE) SARS-CoV-2 target nucleic acids are NOT DETECTED. The SARS-CoV-2 RNA is generally detectable in upper and lower respiratory specimens during the acute phase of infection. Negative results do not preclude SARS-CoV-2 infection, do not rule out co-infections with other pathogens, and should not be used as the sole basis for treatment or other patient management decisions. Negative results must be combined with clinical observations, patient history, and epidemiological information. The expected result is Negative. Fact Sheet for Patients: SugarRoll.be Fact Sheet for Healthcare Providers: https://www.woods-mathews.com/ This test is not yet approved or cleared by the [place] FDA and  has been authorized for detection and/or diagnosis of SARS-CoV-2 by FDA under an Emergency Use Authorization (EUA). This EUA will remain  in effect (meaning this test can be used) for the duration of the COVID-19 declaration under Section 56 4(b)(1) of the Act, 21 U.S.C. section 360bbb-3(b)(1), unless the authorization is terminated or revoked sooner. Performed at [facility], [place] [address], [place], [place] [address]       Radiology Studies: CT ABDOMEN PELVIS WO CONTRAST  Result Date: 07/09/2019 CLINICAL DATA:  Abdomen pain EXAM: CT ABDOMEN AND PELVIS WITHOUT CONTRAST TECHNIQUE: Multidetector CT imaging of the abdomen and pelvis was performed following the standard protocol without IV contrast. COMPARISON:  05/27/2019, 04/08/2019 FINDINGS: Lower chest: Lung bases demonstrate heterogeneous mostly peripheral opacities likely scarring from prior COVID pneumonia. No consolidation or pleural effusion. Coronary vascular calcification. Mild cardiomegaly. Hepatobiliary: No focal liver abnormality is seen. Status post cholecystectomy. No biliary dilatation. Pancreas: Unremarkable. No pancreatic ductal dilatation or surrounding inflammatory changes. Spleen: Normal in size without focal abnormality. Adrenals/Urinary Tract: Adrenal glands are slightly thickened but without dominant mass. Atrophic kidneys with extensive vascular calcification. No hydronephrosis. Unremarkable urinary bladder Stomach/Bowel: Stomach is within normal limits. Appendix appears normal. No evidence of bowel wall thickening, distention, or inflammatory changes. Vascular/Lymphatic: Extensive aortic atherosclerosis. No aneurysm. No suspicious adenopathy Reproductive: IUD in the uterus Other: No free air. Moderate to large loculated ascites within the anterior abdominal cavity. Peritoneal dialysis catheter coiled within the right anterior abdomen. Musculoskeletal: No acute or suspicious osseous abnormality.  IMPRESSION: 1. Moderate to large volume of loculated appearing ascites within the anterior abdominal cavity 2. Heterogenous peripheral densities at the bases felt secondary to prior COVID pneumonia and scarring 3. Cardiomegaly.  Anasarca.  Atrophic native kidneys Electronically Signed   By: Donavan Foil M.D.   On: 07/09/2019 03:22   DG Chest Port 1 View  Result Date: 07/09/2019 CLINICAL DATA:  Short of breath EXAM: PORTABLE CHEST 1 VIEW COMPARISON:  04/02/2019 FINDINGS: Low lung volumes. Cardiomegaly. Diffuse bilateral reticular and ground-glass opacity. No pleural effusion or pneumothorax. IMPRESSION: Cardiomegaly with mild diffuse fine reticular and ground-glass opacity similar compared to prior, question underlying chronic interstitial lung disease. Electronically Signed   By: Donavan Foil M.D.   On: 07/09/2019 00:44   IR Paracentesis  Result Date: 07/09/2019 INDICATION: Patient with history of end stage renal disease admitted with abdominal pain and ascites presents for diagnostic paracentesis maximum 100 ml per Team's request. EXAM: ULTRASOUND GUIDED DIAGNOSTIC PARACENTESIS MEDICATIONS: Lidocaine 1% 10 mL COMPLICATIONS: None immediate. PROCEDURE: Informed written consent was obtained from the patient after a discussion of the risks, benefits and alternatives to treatment.  A timeout was performed prior to the initiation of the procedure. Initial ultrasound scanning demonstrates a large amount of ascites within the right lower abdominal quadrant. The right lower abdomen was prepped and draped in the usual sterile fashion. 1% lidocaine was used for local anesthesia. Following this, a 19 gauge, 10-cm, Yueh catheter was introduced. An ultrasound image was saved for documentation purposes. The paracentesis was performed. The catheter was removed and a dressing was applied. The patient tolerated the procedure well without immediate post procedural complication. FINDINGS: A total of approximately 100 m of  clear fluid was removed. Samples were sent to the laboratory as requested by the clinical team. IMPRESSION: Successful ultrasound-guided therapeutic paracentesis yielding 100 mililiters of peritoneal fluid. Read by Rushie Nyhan NP Electronically Signed   By: Markus Daft M.D.   On: 07/09/2019 09:12    Scheduled Meds: . aspirin EC  81 mg Oral Daily  . calcitRIOL  0.5 mcg Oral Q [id number]   And  . [START ON 07/13/2019] calcitRIOL  1 mcg Oral Once per day on Sun Sat  . calcium acetate  1,334 mg Oral TID WC  . doxazosin  8 mg Oral Daily  . insulin aspart  0-9 Units Subcutaneous TID AC & HS  . insulin glargine  20 Units Subcutaneous Daily  . labetalol  300 mg Oral BID  . lidocaine      . losartan  100 mg Oral Daily  . pantoprazole  40 mg Oral Daily   Continuous Infusions:   LOS: 0 days   Time spent: 39 minutes   Darliss Cheney, MD [facility]  07/09/2019, 12:41 PM   To contact the attending provider between 7A-7P or the covering provider during after hours 7P-7A, please log into the web site [email or web address].  Addendum: Patient's acetic fluid is consistent with possible SBP and her C. difficile antigen is positive as well.  Restart her oral vancomycin as well as IV Rocephin 2 g daily.  Ideally should take cefotaxime however due to national shortage, I have chosen the alternative which is Rocephin.

## 2019-07-09 NOTE — Progress Notes (Signed)
  Echocardiogram 2D Echocardiogram has been performed.  Jeny Nield G Lanetta Figuero 07/09/2019, 1:22 PM

## 2019-07-09 NOTE — Procedures (Signed)
Ultrasound-guided diagnostic paracentesis performed yielding 100 milliliters maximum per Team's request of clear colored fluid.  Fluid was sent to lab for analysis. No immediate complications. EBL is none.

## 2019-07-10 ENCOUNTER — Ambulatory Visit: Payer: Medicare Other

## 2019-07-10 DIAGNOSIS — K659 Peritonitis, unspecified: Secondary | ICD-10-CM

## 2019-07-10 LAB — COMPREHENSIVE METABOLIC PANEL
ALT: 10 U/L (ref 0–44)
AST: 11 U/L — ABNORMAL LOW (ref 15–41)
Albumin: 2.1 g/dL — ABNORMAL LOW (ref 3.5–5.0)
Alkaline Phosphatase: 47 U/L (ref 38–126)
Anion gap: 20 — ABNORMAL HIGH (ref 5–15)
BUN: 70 mg/dL — ABNORMAL HIGH (ref 6–20)
CO2: 21 mmol/L — ABNORMAL LOW (ref 22–32)
Calcium: 8.4 mg/dL — ABNORMAL LOW (ref 8.9–10.3)
Chloride: 91 mmol/L — ABNORMAL LOW (ref 98–111)
Creatinine, Ser: 11.55 mg/dL — ABNORMAL HIGH (ref 0.44–1.00)
GFR calc Af Amer: 4 mL/min — ABNORMAL LOW (ref >60)
GFR calc non Af Amer: 3 mL/min — ABNORMAL LOW (ref >60)
Glucose, Bld: 154 mg/dL — ABNORMAL HIGH (ref 70–99)
Potassium: 3.8 mmol/L (ref 3.5–5.1)
Sodium: 132 mmol/L — ABNORMAL LOW (ref 135–145)
Total Bilirubin: 0.8 mg/dL (ref 0.3–1.2)
Total Protein: 7 g/dL (ref 6.5–8.1)

## 2019-07-10 LAB — GLUCOSE, CAPILLARY
Glucose-Capillary: 103 mg/dL — ABNORMAL HIGH (ref 70–99)
Glucose-Capillary: 121 mg/dL — ABNORMAL HIGH (ref 70–99)
Glucose-Capillary: 126 mg/dL — ABNORMAL HIGH (ref 70–99)
Glucose-Capillary: 143 mg/dL — ABNORMAL HIGH (ref 70–99)
Glucose-Capillary: 150 mg/dL — ABNORMAL HIGH (ref 70–99)
Glucose-Capillary: 70 mg/dL (ref 70–99)

## 2019-07-10 LAB — CBC WITH DIFFERENTIAL/PLATELET
Abs Immature Granulocytes: 0.03 10*3/uL (ref 0.00–0.07)
Basophils Absolute: 0.1 10*3/uL (ref 0.0–0.1)
Basophils Relative: 1 %
Eosinophils Absolute: 0.3 10*3/uL (ref 0.0–0.5)
Eosinophils Relative: 4 %
HCT: 23.9 % — ABNORMAL LOW (ref 36.0–46.0)
Hemoglobin: 7.2 g/dL — ABNORMAL LOW (ref 12.0–15.0)
Immature Granulocytes: 0 %
Lymphocytes Relative: 15 %
Lymphs Abs: 1.2 10*3/uL (ref 0.7–4.0)
MCH: 22.6 pg — ABNORMAL LOW (ref 26.0–34.0)
MCHC: 30.1 g/dL (ref 30.0–36.0)
MCV: 74.9 fL — ABNORMAL LOW (ref 80.0–100.0)
Monocytes Absolute: 1 10*3/uL (ref 0.1–1.0)
Monocytes Relative: 12 %
Neutro Abs: 5.1 10*3/uL (ref 1.7–7.7)
Neutrophils Relative %: 68 %
Platelets: 200 10*3/uL (ref 150–400)
RBC: 3.19 MIL/uL — ABNORMAL LOW (ref 3.87–5.11)
RDW: 19.4 % — ABNORMAL HIGH (ref 11.5–15.5)
WBC: 7.6 10*3/uL (ref 4.0–10.5)
nRBC: 0 % (ref 0.0–0.2)

## 2019-07-10 LAB — MAGNESIUM: Magnesium: 1.6 mg/dL — ABNORMAL LOW (ref 1.7–2.4)

## 2019-07-10 LAB — HEMOGLOBIN A1C
Hgb A1c MFr Bld: 7.2 % — ABNORMAL HIGH (ref 4.8–5.6)
Mean Plasma Glucose: 160 mg/dL

## 2019-07-10 MED ORDER — HEPARIN SODIUM (PORCINE) 5000 UNIT/ML IJ SOLN
5000.0000 [IU] | Freq: Three times a day (TID) | INTRAMUSCULAR | Status: DC
  Administered 2019-07-10 – 2019-07-16 (×16): 5000 [IU] via SUBCUTANEOUS
  Filled 2019-07-10 (×19): qty 1

## 2019-07-10 MED ORDER — HEPARIN 1000 UNIT/ML FOR PERITONEAL DIALYSIS: 500.0000 [IU] | INTRAMUSCULAR | Status: DC | PRN

## 2019-07-10 NOTE — Progress Notes (Signed)
PROGRESS NOTE        PATIENT DETAILS Name: Janet Mitchell Age: 54 y.o. Sex: female Date of Birth: October 18, 1965 Admit Date: 07/08/2019 Admitting Physician Darliss Cheney, MD AVW:[id number], Myra Rude, MD  Brief Narrative: Patient is a 54 y.o. female with history of ESRD on PD, HTN, DM-2, chronic diarrhea for the past 2 years-[facility] for low hemoglobin/hematocrit and evaluation of worsening abdominal pain.  Found to have a hemoglobin of 6.5 and PD catheter associated peritonitis.  See below for further details.   Significant events: 4/12>> admit to [facility] for severe anemia and PD catheter associated peritonitis  Antimicrobial therapy: Vancomycin: 4/13>>  Rocephin: 4/13>>  Microbiology data: 4/13>> peritoneal fluid culture: Gram-positive cocci  Procedures : None  Consults: Nephrology  DVT Prophylaxis : Prophylactic Heparin   Subjective: Abdominal pain has improved.  Patient has had chronic diarrhea for the past 2 years-for 2 weeks prior to this hospital stay-she was somewhat constipated with very minimal daily bowel movement-yesterday she had 4-5 BMs which are her usual baseline.  Per patient her last antibiotic use was sometime in January.  Per patient she has not had any loose stools since yesterday.  Assessment/Plan: Symptomatic anemia: No indication of blood loss-suspect related to underlying CKD.  Patient is s/p 1 unit of PRBC transfusion-IV iron/Aranesp per nephrology.  PD catheter associated peritonitis: Abdominal pain has improved-exam is benign-Gram stain positive for gram-positive cocci-remains on appropriate antimicrobial therapy.  Once culture data finalized-we will narrow accordingly.  Diarrhea: Chronic issue-do not think she has C. difficile colitis.  She was in fact "constipated" for 2 weeks prior to admission-following which she had around 4-5 bowel movements (baseline) yesterday.  CT abdomen on 4/13 without any colitis.  I will go ahead and  stop oral vancomycin.  Patient to continue to follow with her primary GI MD in [place].   Anasarca: Nephrology following-volume removal with PD.  HTN: Controlled-continue labetalol, losartan  Insulin-dependent DM-2: CBG stable-continue Lantus 20 units daily and SSI.  Follow and optimize  Recent Labs    07/10/19 0342 07/10/19 0741 07/10/19 1209  GLUCAP 150* 143* 103*   GERD: Continue PPI  Obesity: Estimated body mass index is 44.95 kg/m as calculated from the following:   Height as of 05/01/19: 5\' 8"  (1.727 m).   Weight as of this encounter: 134.1 kg.    Diet: Diet Order            Diet renal/carb modified with fluid restriction Diet-HS Snack? Nothing; Fluid restriction: 1200 mL Fluid; Room service appropriate? Yes; Fluid consistency: Thin  Diet effective now              Code Status: Full code   Family Communication: Spouse over the phone.  Disposition Plan: Home vs Home with Home health vs SNF when ready for discharge  Barriers to Discharge:  Antimicrobial agents: Anti-infectives (From admission, onward)   Start     Dose/Rate Route Frequency Ordered Stop   07/09/19 1800  vancomycin (VANCOCIN) 50 mg/mL oral solution 125 mg     125 mg Oral 4 times daily 07/09/19 1614 07/19/19 1759   07/09/19 1700  cefTRIAXone (ROCEPHIN) 2 g in sodium chloride 0.9 % 100 mL IVPB     2 g 200 mL/hr over 30 Minutes Intravenous Every 24 hours 07/09/19 1614     07/09/19 1400  cefTAZidime (FORTAZ) 2  g in sodium chloride 0.9 % 100 mL IVPB     2 g 200 mL/hr over 30 Minutes Intravenous  Once 07/09/19 1354 07/09/19 1900   07/09/19 1345  vancomycin (VANCOREADY) IVPB 2000 mg/400 mL    Note to Pharmacy: For peritonitis in PD patient   2,000 mg 200 mL/hr over 120 Minutes Intravenous  Once 07/09/19 1336 07/09/19 1851   07/09/19 1345  cefTAZidime (FORTAZ) 2 g in sodium chloride 0.9 % 100 mL IVPB  Status:  Discontinued    Note to Pharmacy: For peritonitis in PD patient   2 g 200 mL/hr over  30 Minutes Intravenous  Once 07/09/19 1336 07/09/19 1354       Time spent: 25 minutes-Greater than 50% of this time was spent in counseling, explanation of diagnosis, planning of further management, and coordination of care.  MEDICATIONS: Scheduled Meds: . aspirin EC  81 mg Oral Daily  . calcitRIOL  0.5 mcg Oral Q MTWThF   And  . [START ON 07/13/2019] calcitRIOL  1 mcg Oral Once per day on Sun Sat  . calcium acetate  1,334 mg Oral TID WC  . gentamicin cream  1 application Topical Daily  . insulin aspart  0-9 Units Subcutaneous TID AC & HS  . insulin glargine  20 Units Subcutaneous Daily  . labetalol  300 mg Oral BID  . losartan  100 mg Oral Daily  . pantoprazole  40 mg Oral Daily  . vancomycin  125 mg Oral QID   Continuous Infusions: . cefTRIAXone (ROCEPHIN)  IV 2 g (07/09/19 1950)  . dialysis solution 2.5% low-MG/low-CA    . dialysis solution 4.25% low-MG/low-CA    . ferric gluconate (FERRLECIT/NULECIT) IV 250 mg (07/10/19 1045)   PRN Meds:.dianeal solution for CAPD/CCPD with heparin, dianeal solution for CAPD/CCPD with heparin, hydrALAZINE, ondansetron **OR** ondansetron (ZOFRAN) IV, oxyCODONE-acetaminophen, polyethylene glycol   PHYSICAL EXAM: Vital signs: Vitals:   07/10/19 0341 07/10/19 0800 07/10/19 0934 07/10/19 1230  BP:   (!) 147/52 132/62  Pulse:   71 77  Resp: 20 11    Temp:   98.2 F (36.8 C) 98.4 F (36.9 C)  TempSrc:   Oral Oral  SpO2:   97% 98%  Weight:       Filed Weights   07/10/19 0338  Weight: 134.1 kg   Body mass index is 44.95 kg/m.   Gen Exam:Alert awake-not in any distress HEENT:atraumatic, normocephalic Chest: B/L clear to auscultation anteriorly CVS:S1S2 regular Abdomen:soft non tender, non distended Extremities:no edema Neurology: Non focal Skin: no rash  I have personally reviewed following labs and imaging studies  LABORATORY DATA: CBC: Recent Labs  Lab 07/08/19 1959 07/09/19 1320 07/10/19 0419  WBC 7.2 7.0 7.6    NEUTROABS  --   --  5.1  HGB 6.5* 7.0* 7.2*  HCT 22.0* 23.4* 23.9*  MCV 74.6* 76.5* 74.9*  PLT 248 232 174    Basic Metabolic Panel: Recent Labs  Lab 07/08/19 1959 07/10/19 0419  NA 136 132*  K 3.7 3.8  CL 92* 91*  CO2 27 21*  GLUCOSE 180* 154*  BUN 67* 70*  CREATININE 11.66* 11.55*  CALCIUM 8.5* 8.4*  MG  --  1.6*    GFR: Estimated Creatinine Clearance: 8.4 mL/min (A) (by C-G formula based on SCr of 11.55 mg/dL (H)).  Liver Function Tests: Recent Labs  Lab 07/08/19 1959 07/10/19 0419  AST 11* 11*  ALT 13 10  ALKPHOS 57 47  BILITOT 0.8 0.8  PROT 7.5 7.0  ALBUMIN 2.3* 2.1*   No results for input(s): LIPASE, AMYLASE in the last 168 hours. No results for input(s): AMMONIA in the last 168 hours.  Coagulation Profile: No results for input(s): INR, PROTIME in the last 168 hours.  Cardiac Enzymes: No results for input(s): CKTOTAL, CKMB, CKMBINDEX, TROPONINI in the last 168 hours.  BNP (last 3 results) No results for input(s): PROBNP in the last 8760 hours.  Lipid Profile: No results for input(s): CHOL, HDL, LDLCALC, TRIG, CHOLHDL, LDLDIRECT in the last 72 hours.  Thyroid Function Tests: No results for input(s): TSH, T4TOTAL, FREET4, T3FREE, THYROIDAB in the last 72 hours.  Anemia Panel: Recent Labs    07/08/19 2019  VITAMINB12 478  FOLATE 8.2  FERRITIN 717*  TIBC 213*  IRON 18*  RETICCTPCT 2.0    Urine analysis:    Component Value Date/Time   COLORURINE AMBER (A) 07/31/2018 1800   APPEARANCEUR TURBID (A) 07/31/2018 1800   LABSPEC >1.046 (H) 07/31/2018 1800   PHURINE 5.0 07/31/2018 1800   GLUCOSEU 150 (A) 07/31/2018 1800   HGBUR SMALL (A) 07/31/2018 1800   BILIRUBINUR NEGATIVE 07/31/2018 1800   KETONESUR NEGATIVE 07/31/2018 1800   PROTEINUR >=300 (A) 07/31/2018 1800   NITRITE NEGATIVE 07/31/2018 1800   LEUKOCYTESUR MODERATE (A) 07/31/2018 1800    Sepsis Labs: Lactic Acid, Venous No results found for: LATICACIDVEN  MICROBIOLOGY: Recent  Results (from the past 240 hour(s))  SARS CORONAVIRUS 2 (TAT 6-24 HRS) Nasopharyngeal Nasopharyngeal Swab     Status: None   Collection Time: 07/08/19 10:04 PM   Specimen: Nasopharyngeal Swab  Result Value Ref Range Status   SARS Coronavirus 2 NEGATIVE NEGATIVE Final    Comment: (NOTE) SARS-CoV-2 target nucleic acids are NOT DETECTED. The SARS-CoV-2 RNA is generally detectable in upper and lower respiratory specimens during the acute phase of infection. Negative results do not preclude SARS-CoV-2 infection, do not rule out co-infections with other pathogens, and should not be used as the sole basis for treatment or other patient management decisions. Negative results must be combined with clinical observations, patient history, and epidemiological information. The expected result is Negative. Fact Sheet for Patients: SugarRoll.be Fact Sheet for Healthcare Providers: https://www.woods-mathews.com/ This test is not yet approved or cleared by the [place] FDA and  has been authorized for detection and/or diagnosis of SARS-CoV-2 by FDA under an Emergency Use Authorization (EUA). This EUA will remain  in effect (meaning this test can be used) for the duration of the COVID-19 declaration under Section 56 4(b)(1) of the Act, 21 U.S.C. section 360bbb-3(b)(1), unless the authorization is terminated or revoked sooner. Performed at [facility], [place] [address], [place], [place] [address]   Gram stain     Status: None   Collection Time: 07/09/19  8:55 AM   Specimen: Abdomen; Peritoneal Fluid  Result Value Ref Range Status   Specimen Description ABDOMEN  Final   Special Requests NONE  Final   Gram Stain   Final    WBC PRESENT,BOTH PMN AND MONONUCLEAR GRAM POSITIVE COCCI CYTOSPIN SMEAR Gram Stain Report Called to,Read Back By and Verified WithKym Groom RN 1700 07/09/19 A BROWNING Performed at [facility], [place] [address]  [place], [place] [address]    Report Status 07/09/2019 FINAL  Final  Culture, body fluid-bottle     Status: None (Preliminary result)   Collection Time: 07/09/19  8:55 AM   Specimen: Peritoneal Washings  Result Value Ref Range Status   Specimen Description PERITONEAL ABDOMEN  Final   Special Requests NONE  Final   Gram Stain   Final    GRAM POSITIVE COCCI IN CLUSTERS ANAEROBIC BOTTLE ONLY CRITICAL RESULT CALLED TO, READ BACK BY AND VERIFIED WITH: Manya Silvas [id number] 07/10/2019 Mena Goes Performed at [facility], [place] [address], [place], [place] [address]    Culture GRAM POSITIVE COCCI  Final   Report Status PENDING  Incomplete  C Difficile Quick Screen w PCR reflex     Status: Abnormal   Collection Time: 07/09/19  2:27 PM   Specimen: STOOL  Result Value Ref Range Status   C Diff antigen POSITIVE (A) NEGATIVE Final   C Diff toxin NEGATIVE NEGATIVE Final   C Diff interpretation Results are indeterminate. See PCR results.  Final    Comment: Performed at [facility], [place] [address] [place], [place] [address]  C. Diff by PCR, Reflexed     Status: Abnormal   Collection Time: 07/09/19  2:27 PM  Result Value Ref Range Status   Toxigenic C. Difficile by PCR POSITIVE (A) NEGATIVE Final    Comment: Positive for toxigenic C. difficile with little to no toxin production. Only treat if clinical presentation suggests symptomatic illness. Performed at [facility], [place] [address], [place],  [address]     RADIOLOGY STUDIES/RESULTS: CT ABDOMEN PELVIS WO CONTRAST  Result Date: 07/09/2019 CLINICAL DATA:  Abdomen pain EXAM: CT ABDOMEN AND PELVIS WITHOUT CONTRAST TECHNIQUE: Multidetector CT imaging of the abdomen and pelvis was performed following the standard protocol without IV contrast. COMPARISON:  05/27/2019, 04/08/2019 FINDINGS: Lower chest: Lung bases demonstrate heterogeneous mostly peripheral opacities likely scarring from prior COVID pneumonia. No consolidation or  pleural effusion. Coronary vascular calcification. Mild cardiomegaly. Hepatobiliary: No focal liver abnormality is seen. Status post cholecystectomy. No biliary dilatation. Pancreas: Unremarkable. No pancreatic ductal dilatation or surrounding inflammatory changes. Spleen: Normal in size without focal abnormality. Adrenals/Urinary Tract: Adrenal glands are slightly thickened but without dominant mass. Atrophic kidneys with extensive vascular calcification. No hydronephrosis. Unremarkable urinary bladder Stomach/Bowel: Stomach is within normal limits. Appendix appears normal. No evidence of bowel wall thickening, distention, or inflammatory changes. Vascular/Lymphatic: Extensive aortic atherosclerosis. No aneurysm. No suspicious adenopathy Reproductive: IUD in the uterus Other: No free air. Moderate to large loculated ascites within the anterior abdominal cavity. Peritoneal dialysis catheter coiled within the right anterior abdomen. Musculoskeletal: No acute or suspicious osseous abnormality. IMPRESSION: 1. Moderate to large volume of loculated appearing ascites within the anterior abdominal cavity 2. Heterogenous peripheral densities at the bases felt secondary to prior COVID pneumonia and scarring 3. Cardiomegaly.  Anasarca.  Atrophic native kidneys Electronically Signed   By: Donavan Foil M.D.   On: 07/09/2019 03:22   DG Chest Port 1 View  Result Date: 07/09/2019 CLINICAL DATA:  Short of breath EXAM: PORTABLE CHEST 1 VIEW COMPARISON:  04/02/2019 FINDINGS: Low lung volumes. Cardiomegaly. Diffuse bilateral reticular and ground-glass opacity. No pleural effusion or pneumothorax. IMPRESSION: Cardiomegaly with mild diffuse fine reticular and ground-glass opacity similar compared to prior, question underlying chronic interstitial lung disease. Electronically Signed   By: Donavan Foil M.D.   On: 07/09/2019 00:44   ECHOCARDIOGRAM COMPLETE  Result Date: 07/09/2019    ECHOCARDIOGRAM REPORT   Patient Name:   [facility]  [facility] Date of Exam: 07/09/2019 Medical Rec #:  [id number]          Height:       68.0 in Accession #:    [id number]  Weight:       250.7 lb Date of Birth:  [birth year]           BSA:          2.250 m Patient Age:    79 years           BP:           161/73 mmHg Patient Gender: F                  HR:           76 bpm. Exam Location:  Inpatient Procedure: 2D Echo, Cardiac Doppler and Color Doppler Indications:    I50.23 Acute on chronic systolic (congestive) heart failure  History:        Patient has no prior history of Echocardiogram examinations.                 Risk Factors:Hypertension and Diabetes. ESRD. COVID-19.  Sonographer:    Jonelle Sidle Dance Referring Phys: [id number] [place]  1. Left ventricular ejection fraction, by estimation, is 50 to 55%. The left ventricle has normal function. The left ventricle demonstrates regional wall motion abnormalities (see scoring diagram/findings for description). There is moderate concentric left ventricular hypertrophy. Left ventricular diastolic parameters are consistent with Grade II diastolic dysfunction (pseudonormalization). Elevated left atrial pressure. There is mild hypokinesis of the left ventricular, entire inferolateral wall.  2. Right ventricular systolic function is mildly reduced. The right ventricular size is normal. There is moderately elevated pulmonary artery systolic pressure.  3. Left atrial size was moderately dilated.  4. Right atrial size was mildly dilated.  5. The mitral valve is normal in structure. Trivial mitral valve regurgitation.  6. Tricuspid valve regurgitation is mild to moderate.  7. The aortic valve is normal in structure. Aortic valve regurgitation is trivial.  8. The inferior vena cava is dilated in size with >50% respiratory variability, suggesting right atrial pressure of 8 mmHg. FINDINGS  Left Ventricle: Left ventricular ejection fraction, by estimation, is 50 to 55%. The left ventricle has normal function.  The left ventricle demonstrates regional wall motion abnormalities. Mild hypokinesis of the left ventricular, entire inferolateral wall. The left ventricular internal cavity size was normal in size. There is moderate concentric left ventricular hypertrophy. Left ventricular diastolic parameters are consistent with Grade II diastolic dysfunction (pseudonormalization). Elevated left atrial pressure. Right Ventricle: The right ventricular size is normal. No increase in right ventricular wall thickness. Right ventricular systolic function is mildly reduced. There is moderately elevated pulmonary artery systolic pressure. The tricuspid regurgitant velocity is 2.91 m/s, and with an assumed right atrial pressure of 8 mmHg, the estimated right ventricular systolic pressure is 67.1 mmHg. Left Atrium: Left atrial size was moderately dilated. Right Atrium: Right atrial size was mildly dilated. Pericardium: There is no evidence of pericardial effusion. Mitral Valve: The mitral valve is normal in structure. Mild mitral annular calcification. Trivial mitral valve regurgitation. Tricuspid Valve: The tricuspid valve is normal in structure. Tricuspid valve regurgitation is mild to moderate. Aortic Valve: The aortic valve is normal in structure. Aortic valve regurgitation is trivial. Pulmonic Valve: The pulmonic valve was normal in structure. Pulmonic valve regurgitation is not visualized. Aorta: The aortic root and ascending aorta are structurally normal, with no evidence of dilitation. Venous: The inferior vena cava is dilated in size with greater than 50% respiratory variability, suggesting right atrial pressure of 8 mmHg. IAS/Shunts: No atrial level shunt detected by color flow Doppler.  LEFT VENTRICLE  PLAX 2D LVIDd:         5.30 cm  Diastology LVIDs:         3.70 cm  LV e' lateral:   8.05 cm/s LV PW:         1.50 cm  LV E/e' lateral: 16.8 LV IVS:        1.30 cm  LV e' medial:    5.22 cm/s LVOT diam:     1.90 cm  LV E/e' medial:   25.9 LV SV:         57 LV SV Index:   25 LVOT Area:     2.84 cm  RIGHT VENTRICLE            IVC RV Basal diam:  3.50 cm    IVC diam: 2.20 cm RV Mid diam:    2.10 cm RV S prime:     9.03 cm/s TAPSE (M-mode): 2.0 cm LEFT ATRIUM              Index       RIGHT ATRIUM           Index LA diam:        4.80 cm  2.13 cm/m  RA Area:     19.00 cm LA Vol (A2C):   107.0 ml 47.56 ml/m RA Volume:   54.50 ml  24.23 ml/m LA Vol (A4C):   77.4 ml  34.40 ml/m LA Biplane Vol: 94.9 ml  42.18 ml/m  AORTIC VALVE LVOT Vmax:   83.00 cm/s LVOT Vmean:  61.400 cm/s LVOT VTI:    0.200 m  AORTA Ao Root diam: 3.10 cm Ao Asc diam:  3.15 cm MITRAL VALVE                TRICUSPID VALVE MV Area (PHT): 4.21 cm     TR Peak grad:   33.9 mmHg MV Decel Time: 180 msec     TR Vmax:        291.00 cm/s MV E velocity: 135.00 cm/s MV A velocity: 79.60 cm/s   SHUNTS MV E/A ratio:  1.70         Systemic VTI:  0.20 m                             Systemic Diam: 1.90 cm Dani Gobble Croitoru MD Electronically signed by Sanda Klein MD Signature Date/Time: 07/09/2019/1:54:09 PM    Final    IR Paracentesis  Result Date: 07/09/2019 INDICATION: Patient with history of end stage renal disease admitted with abdominal pain and ascites presents for diagnostic paracentesis maximum 100 ml per Team's request. EXAM: ULTRASOUND GUIDED DIAGNOSTIC PARACENTESIS MEDICATIONS: Lidocaine 1% 10 mL COMPLICATIONS: None immediate. PROCEDURE: Informed written consent was obtained from the patient after a discussion of the risks, benefits and alternatives to treatment. A timeout was performed prior to the initiation of the procedure. Initial ultrasound scanning demonstrates a large amount of ascites within the right lower abdominal quadrant. The right lower abdomen was prepped and draped in the usual sterile fashion. 1% lidocaine was used for local anesthesia. Following this, a 19 gauge, 10-cm, Yueh catheter was introduced. An ultrasound image was saved for documentation purposes. The  paracentesis was performed. The catheter was removed and a dressing was applied. The patient tolerated the procedure well without immediate post procedural complication. FINDINGS: A total of approximately 100 m of clear fluid was removed. Samples were sent to the laboratory as requested by  the clinical team. IMPRESSION: Successful ultrasound-guided therapeutic paracentesis yielding 100 mililiters of peritoneal fluid. Read by Rushie Nyhan NP Electronically Signed   By: Markus Daft M.D.   On: 07/09/2019 09:12     LOS: 1 day   Oren Binet, MD  [facility]    To contact the attending provider between 7A-7P or the covering provider during after hours 7P-7A, please log into the web site [email or web address] and access using universal [place] password for that web site. If you do not have the password, please call the hospital operator.  07/10/2019, 3:20 PM

## 2019-07-10 NOTE — Progress Notes (Addendum)
[facility] Progress Note   Subjective: Not feeling well, has headache. Otherwise, pleasant, denies SOB. Abdomin still tender.   Afebrile over night.  Objective Vitals:   07/10/19 0341 07/10/19 0800 07/10/19 0934 07/10/19 1230  BP:   (!) 147/52 132/62  Pulse:   71 77  Resp: 20 11    Temp:   98.2 F (36.8 C) 98.4 F (36.9 C)  TempSrc:   Oral Oral  SpO2:   97% 98%  Weight:       Physical Exam General: Pleasant obese female in NAD Heart: S1,S2 HD distant.  Lungs: Bilateral breath sounds decreased in bases otherwise CTAB Abdomen: Still tender, active BS. PD catheter RLL drsg intact Extremities: 2+ LE edema. Periorbital edema, facial edema.  Dialysis Access: PD catheter drsg CDI.    Additional Objective Labs: Basic Metabolic Panel: Recent Labs  Lab 07/08/19 1959 07/10/19 0419  NA 136 132*  K 3.7 3.8  CL 92* 91*  CO2 27 21*  GLUCOSE 180* 154*  BUN 67* 70*  CREATININE 11.66* 11.55*  CALCIUM 8.5* 8.4*   Liver Function Tests: Recent Labs  Lab 07/08/19 1959 07/10/19 0419  AST 11* 11*  ALT 13 10  ALKPHOS 57 47  BILITOT 0.8 0.8  PROT 7.5 7.0  ALBUMIN 2.3* 2.1*   No results for input(s): LIPASE, AMYLASE in the last 168 hours. CBC: Recent Labs  Lab 07/08/19 1959 07/09/19 1320 07/10/19 0419  WBC 7.2 7.0 7.6  NEUTROABS  --   --  5.1  HGB 6.5* 7.0* 7.2*  HCT 22.0* 23.4* 23.9*  MCV 74.6* 76.5* 74.9*  PLT 248 232 200   Blood Culture    Component Value Date/Time   SDES ABDOMEN 07/09/2019 0855   SDES PERITONEAL ABDOMEN 07/09/2019 0855   SPECREQUEST NONE 07/09/2019 0855   SPECREQUEST NONE 07/09/2019 0855   CULT GRAM POSITIVE COCCI 07/09/2019 0855   REPTSTATUS 07/09/2019 FINAL 07/09/2019 0855   REPTSTATUS PENDING 07/09/2019 0855    Cardiac Enzymes: No results for input(s): CKTOTAL, CKMB, CKMBINDEX, TROPONINI in the last 168 hours. CBG: Recent Labs  Lab 07/09/19 2019 07/10/19 0032 07/10/19 0342 07/10/19 0741 07/10/19 1209  GLUCAP  122* 126* 150* 143* 103*   Iron Studies:  Recent Labs    07/08/19 2019  IRON 18*  TIBC 213*  FERRITIN 717*   @lablastinr3 @ Studies/Results: CT ABDOMEN PELVIS WO CONTRAST  Result Date: 07/09/2019 CLINICAL DATA:  Abdomen pain EXAM: CT ABDOMEN AND PELVIS WITHOUT CONTRAST TECHNIQUE: Multidetector CT imaging of the abdomen and pelvis was performed following the standard protocol without IV contrast. COMPARISON:  05/27/2019, 04/08/2019 FINDINGS: Lower chest: Lung bases demonstrate heterogeneous mostly peripheral opacities likely scarring from prior COVID pneumonia. No consolidation or pleural effusion. Coronary vascular calcification. Mild cardiomegaly. Hepatobiliary: No focal liver abnormality is seen. Status post cholecystectomy. No biliary dilatation. Pancreas: Unremarkable. No pancreatic ductal dilatation or surrounding inflammatory changes. Spleen: Normal in size without focal abnormality. Adrenals/Urinary Tract: Adrenal glands are slightly thickened but without dominant mass. Atrophic kidneys with extensive vascular calcification. No hydronephrosis. Unremarkable urinary bladder Stomach/Bowel: Stomach is within normal limits. Appendix appears normal. No evidence of bowel wall thickening, distention, or inflammatory changes. Vascular/Lymphatic: Extensive aortic atherosclerosis. No aneurysm. No suspicious adenopathy Reproductive: IUD in the uterus Other: No free air. Moderate to large loculated ascites within the anterior abdominal cavity. Peritoneal dialysis catheter coiled within the right anterior abdomen. Musculoskeletal: No acute or suspicious osseous abnormality. IMPRESSION: 1. Moderate to large volume of loculated appearing ascites within the anterior abdominal  cavity 2. Heterogenous peripheral densities at the bases felt secondary to prior COVID pneumonia and scarring 3. Cardiomegaly.  Anasarca.  Atrophic native kidneys Electronically Signed   By: Donavan Foil M.D.   On: 07/09/2019 03:22   DG  Chest Port 1 View  Result Date: 07/09/2019 CLINICAL DATA:  Short of breath EXAM: PORTABLE CHEST 1 VIEW COMPARISON:  04/02/2019 FINDINGS: Low lung volumes. Cardiomegaly. Diffuse bilateral reticular and ground-glass opacity. No pleural effusion or pneumothorax. IMPRESSION: Cardiomegaly with mild diffuse fine reticular and ground-glass opacity similar compared to prior, question underlying chronic interstitial lung disease. Electronically Signed   By: Donavan Foil M.D.   On: 07/09/2019 00:44   ECHOCARDIOGRAM COMPLETE  Result Date: 07/09/2019    ECHOCARDIOGRAM REPORT   Patient Name:   [facility] Date of Exam: 07/09/2019 Medical Rec #:  [id number]          Height:       68.0 in Accession #:    [id number]         Weight:       250.7 lb Date of Birth:  May 28, 1965           BSA:          2.250 m Patient Age:    54 years           BP:           161/73 mmHg Patient Gender: F                  HR:           76 bpm. Exam Location:  Inpatient Procedure: 2D Echo, Cardiac Doppler and Color Doppler Indications:    I50.23 Acute on chronic systolic (congestive) heart failure  History:        Patient has no prior history of Echocardiogram examinations.                 Risk Factors:Hypertension and Diabetes. ESRD. COVID-19.  Sonographer:    Jonelle Sidle Dance Referring Phys: [id number] Gray  1. Left ventricular ejection fraction, by estimation, is 50 to 55%. The left ventricle has normal function. The left ventricle demonstrates regional wall motion abnormalities (see scoring diagram/findings for description). There is moderate concentric left ventricular hypertrophy. Left ventricular diastolic parameters are consistent with Grade II diastolic dysfunction (pseudonormalization). Elevated left atrial pressure. There is mild hypokinesis of the left ventricular, entire inferolateral wall.  2. Right ventricular systolic function is mildly reduced. The right ventricular size is normal. There is moderately elevated  pulmonary artery systolic pressure.  3. Left atrial size was moderately dilated.  4. Right atrial size was mildly dilated.  5. The mitral valve is normal in structure. Trivial mitral valve regurgitation.  6. Tricuspid valve regurgitation is mild to moderate.  7. The aortic valve is normal in structure. Aortic valve regurgitation is trivial.  8. The inferior vena cava is dilated in size with >50% respiratory variability, suggesting right atrial pressure of 8 mmHg. FINDINGS  Left Ventricle: Left ventricular ejection fraction, by estimation, is 50 to 55%. The left ventricle has normal function. The left ventricle demonstrates regional wall motion abnormalities. Mild hypokinesis of the left ventricular, entire inferolateral wall. The left ventricular internal cavity size was normal in size. There is moderate concentric left ventricular hypertrophy. Left ventricular diastolic parameters are consistent with Grade II diastolic dysfunction (pseudonormalization). Elevated left atrial pressure. Right Ventricle: The right ventricular size is normal. No increase in right ventricular wall thickness.  Right ventricular systolic function is mildly reduced. There is moderately elevated pulmonary artery systolic pressure. The tricuspid regurgitant velocity is 2.91 m/s, and with an assumed right atrial pressure of 8 mmHg, the estimated right ventricular systolic pressure is 16.1 mmHg. Left Atrium: Left atrial size was moderately dilated. Right Atrium: Right atrial size was mildly dilated. Pericardium: There is no evidence of pericardial effusion. Mitral Valve: The mitral valve is normal in structure. Mild mitral annular calcification. Trivial mitral valve regurgitation. Tricuspid Valve: The tricuspid valve is normal in structure. Tricuspid valve regurgitation is mild to moderate. Aortic Valve: The aortic valve is normal in structure. Aortic valve regurgitation is trivial. Pulmonic Valve: The pulmonic valve was normal in structure.  Pulmonic valve regurgitation is not visualized. Aorta: The aortic root and ascending aorta are structurally normal, with no evidence of dilitation. Venous: The inferior vena cava is dilated in size with greater than 50% respiratory variability, suggesting right atrial pressure of 8 mmHg. IAS/Shunts: No atrial level shunt detected by color flow Doppler.  LEFT VENTRICLE PLAX 2D LVIDd:         5.30 cm  Diastology LVIDs:         3.70 cm  LV e' lateral:   8.05 cm/s LV PW:         1.50 cm  LV E/e' lateral: 16.8 LV IVS:        1.30 cm  LV e' medial:    5.22 cm/s LVOT diam:     1.90 cm  LV E/e' medial:  25.9 LV SV:         57 LV SV Index:   25 LVOT Area:     2.84 cm  RIGHT VENTRICLE            IVC RV Basal diam:  3.50 cm    IVC diam: 2.20 cm RV Mid diam:    2.10 cm RV S prime:     9.03 cm/s TAPSE (M-mode): 2.0 cm LEFT ATRIUM              Index       RIGHT ATRIUM           Index LA diam:        4.80 cm  2.13 cm/m  RA Area:     19.00 cm LA Vol (A2C):   107.0 ml 47.56 ml/m RA Volume:   54.50 ml  24.23 ml/m LA Vol (A4C):   77.4 ml  34.40 ml/m LA Biplane Vol: 94.9 ml  42.18 ml/m  AORTIC VALVE LVOT Vmax:   83.00 cm/s LVOT Vmean:  61.400 cm/s LVOT VTI:    0.200 m  AORTA Ao Root diam: 3.10 cm Ao Asc diam:  3.15 cm MITRAL VALVE                TRICUSPID VALVE MV Area (PHT): 4.21 cm     TR Peak grad:   33.9 mmHg MV Decel Time: 180 msec     TR Vmax:        291.00 cm/s MV E velocity: 135.00 cm/s MV A velocity: 79.60 cm/s   SHUNTS MV E/A ratio:  1.70         Systemic VTI:  0.20 m                             Systemic Diam: 1.90 cm Dani Gobble Croitoru MD Electronically signed by Sanda Klein MD Signature Date/Time: 07/09/2019/1:54:09 PM    Final  IR Paracentesis  Result Date: 07/09/2019 INDICATION: Patient with history of end stage renal disease admitted with abdominal pain and ascites presents for diagnostic paracentesis maximum 100 ml per Team's request. EXAM: ULTRASOUND GUIDED DIAGNOSTIC PARACENTESIS MEDICATIONS: Lidocaine 1%  10 mL COMPLICATIONS: None immediate. PROCEDURE: Informed written consent was obtained from the patient after a discussion of the risks, benefits and alternatives to treatment. A timeout was performed prior to the initiation of the procedure. Initial ultrasound scanning demonstrates a large amount of ascites within the right lower abdominal quadrant. The right lower abdomen was prepped and draped in the usual sterile fashion. 1% lidocaine was used for local anesthesia. Following this, a 19 gauge, 10-cm, Yueh catheter was introduced. An ultrasound image was saved for documentation purposes. The paracentesis was performed. The catheter was removed and a dressing was applied. The patient tolerated the procedure well without immediate post procedural complication. FINDINGS: A total of approximately 100 m of clear fluid was removed. Samples were sent to the laboratory as requested by the clinical team. IMPRESSION: Successful ultrasound-guided therapeutic paracentesis yielding 100 mililiters of peritoneal fluid. Read by Rushie Nyhan NP Electronically Signed   By: Markus Daft M.D.   On: 07/09/2019 09:12   Medications: . cefTRIAXone (ROCEPHIN)  IV 2 g (07/09/19 1950)  . dialysis solution 2.5% low-MG/low-CA    . dialysis solution 4.25% low-MG/low-CA    . ferric gluconate (FERRLECIT/NULECIT) IV 250 mg (07/10/19 1045)   . aspirin EC  81 mg Oral Daily  . calcitRIOL  0.5 mcg Oral Q [id number]   And  . [START ON 07/13/2019] calcitRIOL  1 mcg Oral Once per day on Sun Sat  . calcium acetate  1,334 mg Oral TID WC  . gentamicin cream  1 application Topical Daily  . insulin aspart  0-9 Units Subcutaneous TID AC & HS  . insulin glargine  20 Units Subcutaneous Daily  . labetalol  300 mg Oral BID  . losartan  100 mg Oral Daily  . pantoprazole  40 mg Oral Daily  . vancomycin  125 mg Oral QID   HD orders: Dialyzes at [place] unknown. PD-5 cycles overnight dwell volume of 2000-using 2.5 and 4.25 of late-  Extraneal last bag fill  Assessment/Plan: 54 year old black female with ESRD on PD-presenting with symptomatic anemia. However, found to have peritonitis and significant volume overload with low albumin  1  Acute on chronic anemia-hemoglobin 6.5 on adm. S/P 1 unit PRBCs 07/08/19. HGB ^ 7.2 today.  Iron load ordered in spite of peritonitis.  Rec'd Aranesp 200 mcg SQ 04/13. Follow HGB, tranfuse as needed.  2  Peritonitis-Gram stain did not show organism.Rec'd 2 g of vancomycin and 2 g of Fortaz to be given today. Then will await culture data to dose regimen further. 3. ESRD on CCPD. Had been told to use 2.5% solution and 4.25 % solution by OP provider but was admitted here before she attempted this. Had rapid cycling CCPD last PM for peritonitis. Will order all 4.25% solution tonight since she only UF'd 839 ml overnight and attempt to lower volume.  Albumin very low. Consider changing from PD to HD until albumin/volume status improves.  3. Diarrhea-C Diff antigen positive, toxin negative. Says she has had diarrhea for over two years. On oral vanc per phamary.  4 Hypertension: Hypertensive and volume overloaded on admission. BP better controlled but still with evidence of generalized anascara. Rapid exchanges last PM with higher osmolality fluids to achieve ultrafiltration. Her albumin is also causing third  spacing. Unclear if she will need to convert from PD to hemo due to this issue 5. Metabolic Bone Disease: Continue home calcitriol and PhosLo 6. Nutrition: albumin very low. Add prostat, dietician to see pt.    Rita H. Brown NP-C 07/10/2019, 1:02 PM  [employer] [phone]  I have seen and examined this patient and agree with plan and assessment in the above note with renal recommendations/intervention highlighted.  She has been having issues with volume overload and may be failing ultrafiltration with her PD (she has been on PD for 4 years).  She also has a low albumin.  We  discussed the possible need to transition to HD if she does not ultrafilter.  I will order all 4.25% tonight given her volume overload and was only negative 839 ml overnight with 1/2 2.5 % and 1/2 4.25%.   Governor Rooks Kashaun Bebo,MD 07/10/2019 3:35 PM

## 2019-07-11 LAB — RENAL FUNCTION PANEL
Albumin: 2.1 g/dL — ABNORMAL LOW (ref 3.5–5.0)
Anion gap: 20 — ABNORMAL HIGH (ref 5–15)
BUN: 69 mg/dL — ABNORMAL HIGH (ref 6–20)
CO2: 22 mmol/L (ref 22–32)
Calcium: 8.9 mg/dL (ref 8.9–10.3)
Chloride: 91 mmol/L — ABNORMAL LOW (ref 98–111)
Creatinine, Ser: 11.6 mg/dL — ABNORMAL HIGH (ref 0.44–1.00)
GFR calc Af Amer: 4 mL/min — ABNORMAL LOW (ref >60)
GFR calc non Af Amer: 3 mL/min — ABNORMAL LOW (ref >60)
Glucose, Bld: 192 mg/dL — ABNORMAL HIGH (ref 70–99)
Phosphorus: 8.4 mg/dL — ABNORMAL HIGH (ref 2.5–4.6)
Potassium: 4 mmol/L (ref 3.5–5.1)
Sodium: 133 mmol/L — ABNORMAL LOW (ref 135–145)

## 2019-07-11 LAB — CBC
HCT: 24.3 % — ABNORMAL LOW (ref 36.0–46.0)
Hemoglobin: 7.2 g/dL — ABNORMAL LOW (ref 12.0–15.0)
MCH: 22.6 pg — ABNORMAL LOW (ref 26.0–34.0)
MCHC: 29.6 g/dL — ABNORMAL LOW (ref 30.0–36.0)
MCV: 76.4 fL — ABNORMAL LOW (ref 80.0–100.0)
Platelets: 185 10*3/uL (ref 150–400)
RBC: 3.18 MIL/uL — ABNORMAL LOW (ref 3.87–5.11)
RDW: 19.8 % — ABNORMAL HIGH (ref 11.5–15.5)
WBC: 6.7 10*3/uL (ref 4.0–10.5)
nRBC: 0 % (ref 0.0–0.2)

## 2019-07-11 LAB — PH, BODY FLUID: pH, Body Fluid: 7.5

## 2019-07-11 LAB — GLUCOSE, CAPILLARY
Glucose-Capillary: 170 mg/dL — ABNORMAL HIGH (ref 70–99)
Glucose-Capillary: 149 mg/dL — ABNORMAL HIGH (ref 70–99)
Glucose-Capillary: 119 mg/dL — ABNORMAL HIGH (ref 70–99)
Glucose-Capillary: 167 mg/dL — ABNORMAL HIGH (ref 70–99)

## 2019-07-11 LAB — PATHOLOGIST SMEAR REVIEW

## 2019-07-11 MED ORDER — ACETAMINOPHEN 325 MG PO TABS
650.0000 mg | ORAL_TABLET | Freq: Four times a day (QID) | ORAL | Status: DC | PRN
  Administered 2019-07-11 – 2019-07-17 (×10): 650 mg via ORAL
  Filled 2019-07-11 (×10): qty 2

## 2019-07-11 MED ORDER — VANCOMYCIN VARIABLE DOSE PER UNSTABLE RENAL FUNCTION (PHARMACIST DOSING): Status: DC

## 2019-07-11 NOTE — Progress Notes (Addendum)
[facility] Progress Note   Subjective: Feels better, no diarrhea since yesterday. Decreased abdominal girth and tenderness. Only 0.8 kg removed with CCPD last night but BP soft, C/O light headedness.   Objective Vitals:   07/10/19 1912 07/10/19 2055 07/11/19 0420 07/11/19 0424  BP: 132/70 124/62 118/60   Pulse: 67 66 68   Resp:      Temp: 98.1 F (36.7 C) (!) 97.5 F (36.4 C) (!) 97.5 F (36.4 C)   TempSrc: Axillary Axillary Oral   SpO2:  93% 100%   Weight:    133.3 kg   Physical Exam General: Pleasant obese female in NAD Heart: S1,S2 HD distant.  Lungs: Bilateral breath sounds decreased in bases otherwise CTAB Abdomen: Still tender, active BS. PD catheter RLL drsg intact Extremities: 2+ LE edema. Periorbital edema, facial edema.  Dialysis Access: PD catheter drsg CDI.    Additional Objective Labs: Basic Metabolic Panel: Recent Labs  Lab 07/08/19 1959 07/10/19 0419 07/11/19 0358  NA 136 132* 133*  K 3.7 3.8 4.0  CL 92* 91* 91*  CO2 27 21* 22  GLUCOSE 180* 154* 192*  BUN 67* 70* 69*  CREATININE 11.66* 11.55* 11.60*  CALCIUM 8.5* 8.4* 8.9  PHOS  --   --  8.4*   Liver Function Tests: Recent Labs  Lab 07/08/19 1959 07/10/19 0419 07/11/19 0358  AST 11* 11*  --   ALT 13 10  --   ALKPHOS 57 47  --   BILITOT 0.8 0.8  --   PROT 7.5 7.0  --   ALBUMIN 2.3* 2.1* 2.1*   No results for input(s): LIPASE, AMYLASE in the last 168 hours. CBC: Recent Labs  Lab 07/08/19 1959 07/08/19 1959 07/09/19 1320 07/10/19 0419 07/11/19 0358  WBC 7.2   < > 7.0 7.6 6.7  NEUTROABS  --   --   --  5.1  --   HGB 6.5*   < > 7.0* 7.2* 7.2*  HCT 22.0*   < > 23.4* 23.9* 24.3*  MCV 74.6*  --  76.5* 74.9* 76.4*  PLT 248   < > 232 200 185   < > = values in this interval not displayed.   Blood Culture    Component Value Date/Time   SDES ABDOMEN 07/09/2019 0855   SDES PERITONEAL ABDOMEN 07/09/2019 0855   SPECREQUEST NONE 07/09/2019 0855   SPECREQUEST NONE  07/09/2019 0855   CULT  07/09/2019 0855    GRAM POSITIVE COCCI IDENTIFICATION AND SUSCEPTIBILITIES TO FOLLOW Performed at [facility], [place] [address], [place], [place] [address]    REPTSTATUS 07/09/2019 FINAL 07/09/2019 0855   REPTSTATUS PENDING 07/09/2019 0855    Cardiac Enzymes: No results for input(s): CKTOTAL, CKMB, CKMBINDEX, TROPONINI in the last 168 hours. CBG: Recent Labs  Lab 07/10/19 0741 07/10/19 1209 07/10/19 1840 07/10/19 2155 07/11/19 0832  GLUCAP 143* 103* 70 121* 170*   Iron Studies:  Recent Labs    07/08/19 2019  IRON 18*  TIBC 213*  FERRITIN 717*   @lablastinr3 @ Studies/Results: ECHOCARDIOGRAM COMPLETE  Result Date: 07/09/2019    ECHOCARDIOGRAM REPORT   Patient Name:   [facility] HART [facility] Date of Exam: 07/09/2019 Medical Rec #:  [id number]          Height:       68.0 in Accession #:    [id number]         Weight:       250.7 lb Date of Birth:  01-24-1966  BSA:          2.250 m Patient Age:    54 years           BP:           161/73 mmHg Patient Gender: F                  HR:           76 bpm. Exam Location:  Inpatient Procedure: 2D Echo, Cardiac Doppler and Color Doppler Indications:    I50.23 Acute on chronic systolic (congestive) heart failure  History:        Patient has no prior history of Echocardiogram examinations.                 Risk Factors:Hypertension and Diabetes. ESRD. COVID-19.  Sonographer:    Jonelle Sidle Dance Referring Phys: [id number] [place]  1. Left ventricular ejection fraction, by estimation, is 50 to 55%. The left ventricle has normal function. The left ventricle demonstrates regional wall motion abnormalities (see scoring diagram/findings for description). There is moderate concentric left ventricular hypertrophy. Left ventricular diastolic parameters are consistent with Grade II diastolic dysfunction (pseudonormalization). Elevated left atrial pressure. There is mild hypokinesis of the left ventricular, entire  inferolateral wall.  2. Right ventricular systolic function is mildly reduced. The right ventricular size is normal. There is moderately elevated pulmonary artery systolic pressure.  3. Left atrial size was moderately dilated.  4. Right atrial size was mildly dilated.  5. The mitral valve is normal in structure. Trivial mitral valve regurgitation.  6. Tricuspid valve regurgitation is mild to moderate.  7. The aortic valve is normal in structure. Aortic valve regurgitation is trivial.  8. The inferior vena cava is dilated in size with >50% respiratory variability, suggesting right atrial pressure of 8 mmHg. FINDINGS  Left Ventricle: Left ventricular ejection fraction, by estimation, is 50 to 55%. The left ventricle has normal function. The left ventricle demonstrates regional wall motion abnormalities. Mild hypokinesis of the left ventricular, entire inferolateral wall. The left ventricular internal cavity size was normal in size. There is moderate concentric left ventricular hypertrophy. Left ventricular diastolic parameters are consistent with Grade II diastolic dysfunction (pseudonormalization). Elevated left atrial pressure. Right Ventricle: The right ventricular size is normal. No increase in right ventricular wall thickness. Right ventricular systolic function is mildly reduced. There is moderately elevated pulmonary artery systolic pressure. The tricuspid regurgitant velocity is 2.91 m/s, and with an assumed right atrial pressure of 8 mmHg, the estimated right ventricular systolic pressure is 03.0 mmHg. Left Atrium: Left atrial size was moderately dilated. Right Atrium: Right atrial size was mildly dilated. Pericardium: There is no evidence of pericardial effusion. Mitral Valve: The mitral valve is normal in structure. Mild mitral annular calcification. Trivial mitral valve regurgitation. Tricuspid Valve: The tricuspid valve is normal in structure. Tricuspid valve regurgitation is mild to moderate. Aortic  Valve: The aortic valve is normal in structure. Aortic valve regurgitation is trivial. Pulmonic Valve: The pulmonic valve was normal in structure. Pulmonic valve regurgitation is not visualized. Aorta: The aortic root and ascending aorta are structurally normal, with no evidence of dilitation. Venous: The inferior vena cava is dilated in size with greater than 50% respiratory variability, suggesting right atrial pressure of 8 mmHg. IAS/Shunts: No atrial level shunt detected by color flow Doppler.  LEFT VENTRICLE PLAX 2D LVIDd:         5.30 cm  Diastology LVIDs:  3.70 cm  LV e' lateral:   8.05 cm/s LV PW:         1.50 cm  LV E/e' lateral: 16.8 LV IVS:        1.30 cm  LV e' medial:    5.22 cm/s LVOT diam:     1.90 cm  LV E/e' medial:  25.9 LV SV:         57 LV SV Index:   25 LVOT Area:     2.84 cm  RIGHT VENTRICLE            IVC RV Basal diam:  3.50 cm    IVC diam: 2.20 cm RV Mid diam:    2.10 cm RV S prime:     9.03 cm/s TAPSE (M-mode): 2.0 cm LEFT ATRIUM              Index       RIGHT ATRIUM           Index LA diam:        4.80 cm  2.13 cm/m  RA Area:     19.00 cm LA Vol (A2C):   107.0 ml 47.56 ml/m RA Volume:   54.50 ml  24.23 ml/m LA Vol (A4C):   77.4 ml  34.40 ml/m LA Biplane Vol: 94.9 ml  42.18 ml/m  AORTIC VALVE LVOT Vmax:   83.00 cm/s LVOT Vmean:  61.400 cm/s LVOT VTI:    0.200 m  AORTA Ao Root diam: 3.10 cm Ao Asc diam:  3.15 cm MITRAL VALVE                TRICUSPID VALVE MV Area (PHT): 4.21 cm     TR Peak grad:   33.9 mmHg MV Decel Time: 180 msec     TR Vmax:        291.00 cm/s MV E velocity: 135.00 cm/s MV A velocity: 79.60 cm/s   SHUNTS MV E/A ratio:  1.70         Systemic VTI:  0.20 m                             Systemic Diam: 1.90 cm Dani Gobble Croitoru MD Electronically signed by Sanda Klein MD Signature Date/Time: 07/09/2019/1:54:09 PM    Final    Medications: . cefTRIAXone (ROCEPHIN)  IV 200 mL/hr at 07/10/19 1800  . dialysis solution 2.5% low-MG/low-CA    . dialysis solution 4.25%  low-MG/low-CA    . ferric gluconate (FERRLECIT/NULECIT) IV 250 mg (07/11/19 0955)   . aspirin EC  81 mg Oral Daily  . calcitRIOL  0.5 mcg Oral Q MTWThF   And  . [START ON 07/13/2019] calcitRIOL  1 mcg Oral Once per day on Sun Sat  . calcium acetate  1,334 mg Oral TID WC  . gentamicin cream  1 application Topical Daily  . heparin injection (subcutaneous)  5,000 Units Subcutaneous Q8H  . insulin aspart  0-9 Units Subcutaneous TID AC & HS  . insulin glargine  20 Units Subcutaneous Daily  . labetalol  300 mg Oral BID  . losartan  100 mg Oral Daily  . pantoprazole  40 mg Oral Daily     [facility] PD-5 cycles overnight dwell volume of 2000-using 2.5 and 4.25 of late- Extraneallast bag fill  Assessment/Plan:54 year old black female with ESRD on PD-presenting with symptomatic anemia. However, found to have peritonitis and significant volume overload with low albumin  1 Acute on chronic anemia-hemoglobin 6.5 on adm. S/P 1 unit PRBCs 07/08/19. HGB ^ 7.2 today.  Iron load ordered in spite of peritonitis.  Rec'd Aranesp 200 mcg SQ 04/13. Follow HGB, tranfuse as needed.  2  Peritonitis-Gram stain did not show organism.Rec'd 2 g of vancomycin and 2 g of Fortaz to be given today. Then will await culture data to dose regimen further. 3. ESRD on CCPD. Had been told to use 2.5% solution and 4.25 % solution by OP provider but was admitted here before she attempted this. Had rapid cycling CCPD on adm for peritonitis.Use all 4.25% solution tonight since she only UF'd 839 ml overnight but did not have as much volume removed as expected. Did have drip and BP and now with lightheadedness. Post wt today 133.3. Will use 1/2 2.25% and half 4.25% dialysate this evening.   Albumin very low. Discussed changing from PD to HD until albumin/volume status improves but hoping to avoid this if possible.  3. Diarrhea-C Diff antigen positive, toxin negative. Says she has had  diarrhea for over two years. On oral vanc per phamary which was DC'd today per primary. 4Hypertension:Hypertensive and volume overloaded on admission. Only 0.8 kg removed last PM but some improvement in facial edema, decreased abdominal girth but still 2-3+ BLE edema. SBP down to 118/60 which is low for her-she is slightly symptomatic. PD dialysate adjusted.  5. Metabolic Bone Disease:Continue home calcitriol and PhosLo 6. Nutrition: albumin very low. Add prostat, dietician to see pt.  7. DM-per primary  Jimmye Norman. Brown NP-C 07/11/2019, 10:12 AM  [employer] [phone]  I have seen and examined this patient and agree with plan and assessment in the above note with renal recommendations/intervention highlighted.  Still with poor ultrafiltration despite using all 4.25% solution.  She also had a drop in BP so will go back to 1/2 2.5% and 4.25%.  I had a frank conversation regarding her failure to ultrafiltrate and her poor clearances and nutritional status.  If she does not respond tonight will need to consult IR or VVS for HD catheter placement and transition to intermittent HD.  This may be temporary but I am concerned that she is having membrane failure after 4 years of PD.  She was amenable to the plan.  Broadus John A Bellamarie Pflug,MD 07/11/2019 1:13 PM

## 2019-07-11 NOTE — Progress Notes (Addendum)
PROGRESS NOTE        PATIENT DETAILS Name: Janet Mitchell Age: 54 y.o. Sex: female Date of Birth: 06-Oct-1965 Admit Date: 07/08/2019 Admitting Physician Darliss Cheney, MD FYB:OFBPZ, Myra Rude, MD  Brief Narrative: Patient is a 54 y.o. female with history of ESRD on PD, HTN, DM-2, chronic diarrhea for the past 2 years-sent to [facility] ED for low hemoglobin/hematocrit and evaluation of worsening abdominal pain.  Found to have a hemoglobin of 6.5 and PD catheter associated peritonitis.  See below for further details.   Significant events: 4/12>> admit to [facility] for severe anemia and PD catheter associated peritonitis  Antimicrobial therapy: Vancomycin: 4/13>> Rocephin: 4/13>>4/15  Microbiology data: 4/13>> peritoneal fluid culture: Gram-positive cocci  Procedures : None  Consults: Nephrology  DVT Prophylaxis : Prophylactic Heparin   Subjective: Hardly any abdominal pain-no diarrhea since yesterday.  Assessment/Plan: Symptomatic anemia: No indication of blood loss-suspect related to underlying CKD.  Patient is s/p 1 unit of PRBC transfusion-IV iron/Aranesp per nephrology.  PD catheter associated peritonitis: Abdominal pain has improved-exam is benign-peritoneal fluid culture positive for staph epidermidis-stop Rocephin-continue with IV vancomycin (discussed with ID-Dr Linus Salmons)  Diarrhea: Chronic issue-do not think she has C. difficile colitis.  She was in fact "constipated" for 2 weeks prior to admission-following which she had around 4-5 bowel movements (baseline) on the day of admission.  She has had no BM in the past 2 days.  CT abdomen on 4/13 without any colitis.  Suspect C. difficile's are more indicative of colonization rather than infection.  No longer on oral vancomycin.  Per patient-she has seen GI MD in [place] for chronic diarrhea-and at one point was placed on cholestyramine that she stopped taking.  ESRD on PD: Nephrology following-concerned that  she is just not getting enough volume removed with PD-nephrology contemplating switching over to HD.  Will await further recommendations from nephrology.  Anasarca: Nephrology following-volume removal with PD.  HTN: Controlled-continue labetalol, losartan  Insulin-dependent DM-2: CBG stable-continue Lantus 20 units daily and SSI.  Follow and optimize  Recent Labs    07/10/19 2155 07/11/19 0832 07/11/19 1243  GLUCAP 121* 170* 149*   GERD: Continue PPI  Obesity: Estimated body mass index is 44.68 kg/m as calculated from the following:   Height as of 05/01/19: 5\' 8"  (1.727 m).   Weight as of this encounter: 133.3 kg.    Diet: Diet Order            Diet renal/carb modified with fluid restriction Diet-HS Snack? Nothing; Fluid restriction: 1200 mL Fluid; Room service appropriate? Yes; Fluid consistency: Thin  Diet effective now              Code Status: Full code   Family Communication: Spouse over the phone on 4/15  Disposition Plan: Likely home with home health services on discharge  Barriers to Discharge: PD catheter associated peritonitis requiring IV antibiotics, severe anemia, anasarca-may require switch to hemodialysis.  Antimicrobial agents: Anti-infectives (From admission, onward)   Start     Dose/Rate Route Frequency Ordered Stop   07/09/19 1800  vancomycin (VANCOCIN) 50 mg/mL oral solution 125 mg  Status:  Discontinued     125 mg Oral 4 times daily 07/09/19 1614 07/10/19 1546   07/09/19 1700  cefTRIAXone (ROCEPHIN) 2 g in sodium chloride 0.9 % 100 mL IVPB     2 g 200 mL/hr over  30 Minutes Intravenous Every 24 hours 07/09/19 1614     07/09/19 1400  cefTAZidime (FORTAZ) 2 g in sodium chloride 0.9 % 100 mL IVPB     2 g 200 mL/hr over 30 Minutes Intravenous  Once 07/09/19 1354 07/09/19 1900   07/09/19 1345  vancomycin (VANCOREADY) IVPB 2000 mg/400 mL    Note to Pharmacy: For peritonitis in PD patient   2,000 mg 200 mL/hr over 120 Minutes Intravenous  Once  07/09/19 1336 07/09/19 1851   07/09/19 1345  cefTAZidime (FORTAZ) 2 g in sodium chloride 0.9 % 100 mL IVPB  Status:  Discontinued    Note to Pharmacy: For peritonitis in PD patient   2 g 200 mL/hr over 30 Minutes Intravenous  Once 07/09/19 1336 07/09/19 1354       Time spent: 25 minutes-Greater than 50% of this time was spent in counseling, explanation of diagnosis, planning of further management, and coordination of care.  MEDICATIONS: Scheduled Meds: . aspirin EC  81 mg Oral Daily  . calcitRIOL  0.5 mcg Oral Q MTWThF   And  . [START ON 07/13/2019] calcitRIOL  1 mcg Oral Once per day on Sun Sat  . calcium acetate  1,334 mg Oral TID WC  . gentamicin cream  1 application Topical Daily  . heparin injection (subcutaneous)  5,000 Units Subcutaneous Q8H  . insulin aspart  0-9 Units Subcutaneous TID AC & HS  . insulin glargine  20 Units Subcutaneous Daily  . labetalol  300 mg Oral BID  . losartan  100 mg Oral Daily  . pantoprazole  40 mg Oral Daily   Continuous Infusions: . cefTRIAXone (ROCEPHIN)  IV 200 mL/hr at 07/10/19 1800  . dialysis solution 2.5% low-MG/low-CA    . dialysis solution 4.25% low-MG/low-CA    . ferric gluconate (FERRLECIT/NULECIT) IV 250 mg (07/11/19 0955)   PRN Meds:.dianeal solution for CAPD/CCPD with heparin, dianeal solution for CAPD/CCPD with heparin, hydrALAZINE, ondansetron **OR** ondansetron (ZOFRAN) IV, oxyCODONE-acetaminophen, polyethylene glycol   PHYSICAL EXAM: Vital signs: Vitals:   07/10/19 1912 07/10/19 2055 07/11/19 0420 07/11/19 0424  BP: 132/70 124/62 118/60   Pulse: 67 66 68   Resp:      Temp: 98.1 F (36.7 C) (!) 97.5 F (36.4 C) (!) 97.5 F (36.4 C)   TempSrc: Axillary Axillary Oral   SpO2:  93% 100%   Weight:    133.3 kg   Filed Weights   07/10/19 0338 07/11/19 0424  Weight: 134.1 kg 133.3 kg   Body mass index is 44.68 kg/m.   Gen Exam:Alert awake-not in any distress HEENT:atraumatic, normocephalic Chest: B/L clear to  auscultation anteriorly CVS:S1S2 regular Abdomen:soft non tender, non distended Extremities:++ edema Neurology: Non focal Skin: no rash  I have personally reviewed following labs and imaging studies  LABORATORY DATA: CBC: Recent Labs  Lab 07/08/19 1959 07/09/19 1320 07/10/19 0419 07/11/19 0358  WBC 7.2 7.0 7.6 6.7  NEUTROABS  --   --  5.1  --   HGB 6.5* 7.0* 7.2* 7.2*  HCT 22.0* 23.4* 23.9* 24.3*  MCV 74.6* 76.5* 74.9* 76.4*  PLT 248 232 200 983    Basic Metabolic Panel: Recent Labs  Lab 07/08/19 1959 07/10/19 0419 07/11/19 0358  NA 136 132* 133*  K 3.7 3.8 4.0  CL 92* 91* 91*  CO2 27 21* 22  GLUCOSE 180* 154* 192*  BUN 67* 70* 69*  CREATININE 11.66* 11.55* 11.60*  CALCIUM 8.5* 8.4* 8.9  MG  --  1.6*  --  PHOS  --   --  8.4*    GFR: Estimated Creatinine Clearance: 8.3 mL/min (A) (by C-G formula based on SCr of 11.6 mg/dL (H)).  Liver Function Tests: Recent Labs  Lab 07/08/19 1959 07/10/19 0419 07/11/19 0358  AST 11* 11*  --   ALT 13 10  --   ALKPHOS 57 47  --   BILITOT 0.8 0.8  --   PROT 7.5 7.0  --   ALBUMIN 2.3* 2.1* 2.1*   No results for input(s): LIPASE, AMYLASE in the last 168 hours. No results for input(s): AMMONIA in the last 168 hours.  Coagulation Profile: No results for input(s): INR, PROTIME in the last 168 hours.  Cardiac Enzymes: No results for input(s): CKTOTAL, CKMB, CKMBINDEX, TROPONINI in the last 168 hours.  BNP (last 3 results) No results for input(s): PROBNP in the last 8760 hours.  Lipid Profile: No results for input(s): CHOL, HDL, LDLCALC, TRIG, CHOLHDL, LDLDIRECT in the last 72 hours.  Thyroid Function Tests: No results for input(s): TSH, T4TOTAL, FREET4, T3FREE, THYROIDAB in the last 72 hours.  Anemia Panel: Recent Labs    07/08/19 2019  VITAMINB12 478  FOLATE 8.2  FERRITIN 717*  TIBC 213*  IRON 18*  RETICCTPCT 2.0    Urine analysis:    Component Value Date/Time   COLORURINE AMBER (A) 07/31/2018 1800     APPEARANCEUR TURBID (A) 07/31/2018 1800   LABSPEC >1.046 (H) 07/31/2018 1800   PHURINE 5.0 07/31/2018 1800   GLUCOSEU 150 (A) 07/31/2018 1800   HGBUR SMALL (A) 07/31/2018 1800   BILIRUBINUR NEGATIVE 07/31/2018 1800   KETONESUR NEGATIVE 07/31/2018 1800   PROTEINUR >=300 (A) 07/31/2018 1800   NITRITE NEGATIVE 07/31/2018 1800   LEUKOCYTESUR MODERATE (A) 07/31/2018 1800    Sepsis Labs: Lactic Acid, Venous No results found for: LATICACIDVEN  MICROBIOLOGY: Recent Results (from the past 240 hour(s))  SARS CORONAVIRUS 2 (TAT 6-24 HRS) Nasopharyngeal Nasopharyngeal Swab     Status: None   Collection Time: 07/08/19 10:04 PM   Specimen: Nasopharyngeal Swab  Result Value Ref Range Status   SARS Coronavirus 2 NEGATIVE NEGATIVE Final    Comment: (NOTE) SARS-CoV-2 target nucleic acids are NOT DETECTED. The SARS-CoV-2 RNA is generally detectable in upper and lower respiratory specimens during the acute phase of infection. Negative results do not preclude SARS-CoV-2 infection, do not rule out co-infections with other pathogens, and should not be used as the sole basis for treatment or other patient management decisions. Negative results must be combined with clinical observations, patient history, and epidemiological information. The expected result is Negative. Fact Sheet for Patients: SugarRoll.be Fact Sheet for Healthcare Providers: https://www.woods-mathews.com/ This test is not yet approved or cleared by the [place] FDA and  has been authorized for detection and/or diagnosis of SARS-CoV-2 by FDA under an Emergency Use Authorization (EUA). This EUA will remain  in effect (meaning this test can be used) for the duration of the COVID-19 declaration under Section 56 4(b)(1) of the Act, 21 U.S.C. section 360bbb-3(b)(1), unless the authorization is terminated or revoked sooner. Performed at [facility], [place] [address]  [place], [place] [address]   Gram stain     Status: None   Collection Time: 07/09/19  8:55 AM   Specimen: Abdomen; Peritoneal Fluid  Result Value Ref Range Status   Specimen Description ABDOMEN  Final   Special Requests NONE  Final   Gram Stain   Final    WBC PRESENT,BOTH PMN AND MONONUCLEAR GRAM POSITIVE  COCCI CYTOSPIN SMEAR Gram Stain Report Called to,Read Back By and Verified WithKym Groom RN 1700 07/09/19 A BROWNING Performed at [facility], [place] [address] [place], [place] [address]    Report Status 07/09/2019 FINAL  Final  Culture, body fluid-bottle     Status: Abnormal (Preliminary result)   Collection Time: 07/09/19  8:55 AM   Specimen: Peritoneal Washings  Result Value Ref Range Status   Specimen Description PERITONEAL ABDOMEN  Final   Special Requests NONE  Final   Gram Stain   Final    GRAM POSITIVE COCCI IN CLUSTERS IN BOTH AEROBIC AND ANAEROBIC BOTTLES CRITICAL RESULT CALLED TO, READ BACK BY AND VERIFIED WITH: TCarie Caddy [id number] 07/10/2019 T. TYSOR    Culture (A)  Final    STAPHYLOCOCCUS EPIDERMIDIS SUSCEPTIBILITIES TO FOLLOW Performed at [facility], [place] [address] [place], [place] [address]    Report Status PENDING  Incomplete  C Difficile Quick Screen w PCR reflex     Status: Abnormal   Collection Time: 07/09/19  2:27 PM   Specimen: STOOL  Result Value Ref Range Status   C Diff antigen POSITIVE (A) NEGATIVE Final   C Diff toxin NEGATIVE NEGATIVE Final   C Diff interpretation Results are indeterminate. See PCR results.  Final    Comment: Performed at [facility], [place] [address], [place], [place] [address]  C. Diff by PCR, Reflexed     Status: Abnormal   Collection Time: 07/09/19  2:27 PM  Result Value Ref Range Status   Toxigenic C. Difficile by PCR POSITIVE (A) NEGATIVE Final    Comment: Positive for toxigenic C. difficile with little to no toxin production. Only treat if clinical presentation suggests symptomatic illness. Performed at  [facility], [place] [address] [place], [place] [address]     RADIOLOGY STUDIES/RESULTS: No results found.   LOS: 2 days   Oren Binet, MD  [facility]    To contact the attending provider between 7A-7P or the covering provider during after hours 7P-7A, please log into the web site [email or web address] and access using universal [place] password for that web site. If you do not have the password, please call the hospital operator.  07/11/2019, 2:46 PM

## 2019-07-11 NOTE — Progress Notes (Signed)
Pharmacy Antibiotic Note  Janet Mitchell is a 54 y.o. female admitted on 07/08/2019 with Peritonitis.  Pharmacy has been consulted for Vancomycin dosing.  ID: Peritonitis, Afebrile. WBC WNL Diarrhea: MD notes Chronic issue-do not think she has C. difficile colitis  Vanco 2g IV x 1 on 4/13>> Fortaz 4/13 x 1 Rocephin 4/13>>4/15 Vanco oral 4/13 QID>>4/23  4/13: Peritoneal washings: Staph Epi. 4/13: Cdiff: POSITIVE 4/12: COVID negative  Plan: F/u peritoneal fluid culture results. MD d/c'd oral Vanco. Does not think she has Cdiff - 4.15: Ghimire asked to resume Vanco until cultures result. Had dose of Vanco on 4/13 (on PD). Check random level to assess for redosing 4/16   Weight: 133.3 kg (293 lb 14 oz)  Temp (24hrs), Avg:97.8 F (36.6 C), Min:97.5 F (36.4 C), Max:98.1 F (36.7 C)  Recent Labs  Lab 07/08/19 1959 07/09/19 1320 07/10/19 0419 07/11/19 0358  WBC 7.2 7.0 7.6 6.7  CREATININE 11.66*  --  11.55* 11.60*    Estimated Creatinine Clearance: 8.3 mL/min (A) (by C-G formula based on SCr of 11.6 mg/dL (H)).    No Known Allergies  Lukis Bunt S. Alford Highland, PharmD, BCPS Clinical Staff Pharmacist [email or web address] [place] 07/11/2019 2:58 PM

## 2019-07-12 LAB — TYPE AND SCREEN
ABO/RH(D): A POS
Antibody Screen: NEGATIVE
Donor AG Type: NEGATIVE
Donor AG Type: NEGATIVE
Unit division: 0
Unit division: 0

## 2019-07-12 LAB — CBC
HCT: 24.9 % — ABNORMAL LOW (ref 36.0–46.0)
Hemoglobin: 7.3 g/dL — ABNORMAL LOW (ref 12.0–15.0)
MCH: 22.3 pg — ABNORMAL LOW (ref 26.0–34.0)
MCHC: 29.3 g/dL — ABNORMAL LOW (ref 30.0–36.0)
MCV: 75.9 fL — ABNORMAL LOW (ref 80.0–100.0)
Platelets: 187 10*3/uL (ref 150–400)
RBC: 3.28 MIL/uL — ABNORMAL LOW (ref 3.87–5.11)
RDW: 19.9 % — ABNORMAL HIGH (ref 11.5–15.5)
WBC: 7.7 10*3/uL (ref 4.0–10.5)
nRBC: 0 % (ref 0.0–0.2)

## 2019-07-12 LAB — BPAM RBC
Blood Product Expiration Date: 202105082359
Blood Product Expiration Date: 202105082359
ISSUE DATE / TIME: 202104122245
Unit Type and Rh: 5100
Unit Type and Rh: 5100

## 2019-07-12 LAB — RENAL FUNCTION PANEL
Albumin: 2.2 g/dL — ABNORMAL LOW (ref 3.5–5.0)
Anion gap: 20 — ABNORMAL HIGH (ref 5–15)
BUN: 69 mg/dL — ABNORMAL HIGH (ref 6–20)
CO2: 22 mmol/L (ref 22–32)
Calcium: 9.1 mg/dL (ref 8.9–10.3)
Chloride: 91 mmol/L — ABNORMAL LOW (ref 98–111)
Creatinine, Ser: 12.07 mg/dL — ABNORMAL HIGH (ref 0.44–1.00)
GFR calc Af Amer: 4 mL/min — ABNORMAL LOW (ref >60)
GFR calc non Af Amer: 3 mL/min — ABNORMAL LOW (ref >60)
Glucose, Bld: 174 mg/dL — ABNORMAL HIGH (ref 70–99)
Phosphorus: 8.7 mg/dL — ABNORMAL HIGH (ref 2.5–4.6)
Potassium: 4 mmol/L (ref 3.5–5.1)
Sodium: 133 mmol/L — ABNORMAL LOW (ref 135–145)

## 2019-07-12 LAB — CULTURE, BODY FLUID W GRAM STAIN -BOTTLE

## 2019-07-12 LAB — GLUCOSE, CAPILLARY
Glucose-Capillary: 173 mg/dL — ABNORMAL HIGH (ref 70–99)
Glucose-Capillary: 108 mg/dL — ABNORMAL HIGH (ref 70–99)
Glucose-Capillary: 87 mg/dL (ref 70–99)
Glucose-Capillary: 139 mg/dL — ABNORMAL HIGH (ref 70–99)

## 2019-07-12 LAB — VANCOMYCIN, RANDOM: Vancomycin Rm: 10

## 2019-07-12 LAB — HEPATITIS B SURFACE ANTIGEN: Hepatitis B Surface Ag: NONREACTIVE

## 2019-07-12 MED ORDER — VANCOMYCIN HCL 2000 MG/400ML IV SOLN
2000.0000 mg | Freq: Once | INTRAVENOUS | Status: AC
  Administered 2019-07-12: 2000 mg via INTRAVENOUS
  Filled 2019-07-12: qty 400

## 2019-07-12 NOTE — Progress Notes (Signed)
Physical Therapy Treatment Patient Details Name: Janet Mitchell MRN: [id number] DOB: 06/02/1965 Today's Date: 07/12/2019    History of Present Illness Patient is a 54 y.o. female with history of ESRD on PD, HTN, DM-2, chronic diarrhea for the past 2 years-sent to [facility] ED for low hemoglobin/hematocrit and evaluation of worsening abdominal pain.  Found to have a hemoglobin of 6.5 and PD catheter associated peritonitis.    PT Comments    Pt was able to increase gait distance today with RW and min guard for safety.  Required bed elevated for sit to stand.  Pt requiring increased time for transfers due to headache/nausea and requiring rest breaks.  Cont to progress as able.    Follow Up Recommendations  Home health PT;Supervision/Assistance - 24 hour     Equipment Recommendations  None recommended by PT    Recommendations for Other Services       Precautions / Restrictions Precautions Precautions: Fall    Mobility  Bed Mobility Overal bed mobility: Independent                Transfers Overall transfer level: Needs assistance Equipment used: Rolling walker (2 wheeled) Transfers: Sit to/from Stand Sit to Stand: Min guard;From elevated surface         General transfer comment: performed x 3  Ambulation/Gait Ambulation/Gait assistance: Min guard Gait Distance (Feet): 80 Feet Assistive device: Rolling walker (2 wheeled) Gait Pattern/deviations: Step-through pattern;Decreased stride length;Wide base of support Gait velocity: decreased   General Gait Details: Slow gait speed with increased lateral sway; min guard for stability and safety   Stairs             Wheelchair Mobility    Modified Rankin (Stroke Patients Only)       Balance Overall balance assessment: Needs assistance Sitting-balance support: Feet supported;No upper extremity supported Sitting balance-Leahy Scale: Normal Sitting balance - Comments: Increased time sitting at EOB with  supervision due to HA and nausea- needed to rest before walking   Standing balance support: During functional activity;Bilateral upper extremity supported Standing balance-Leahy Scale: Fair                              Cognition Arousal/Alertness: Awake/alert Behavior During Therapy: WFL for tasks assessed/performed Overall Cognitive Status: Within Functional Limits for tasks assessed                                        Exercises      General Comments General comments (skin integrity, edema, etc.): VSS checked supine and sit      Pertinent Vitals/Pain Pain Assessment: Faces Faces Pain Scale: Hurts even more Pain Location: headache w/nausea Pain Descriptors / Indicators: Headache Pain Intervention(s): Monitored during session;Relaxation;Repositioned;Other (comment)(closed blinds, gave cold rag and diet gingerale)    Home Living                      Prior Function            PT Goals (current goals can now be found in the care plan section) Progress towards PT goals: Progressing toward goals    Frequency    Min 3X/week      PT Plan Current plan remains appropriate    Co-evaluation              AM-PAC PT "  6 Clicks" Mobility   Outcome Measure  Help needed turning from your back to your side while in a flat bed without using bedrails?: None Help needed moving from lying on your back to sitting on the side of a flat bed without using bedrails?: None Help needed moving to and from a bed to a chair (including a wheelchair)?: None Help needed standing up from a chair using your arms (e.g., wheelchair or bedside chair)?: None Help needed to walk in hospital room?: A Little Help needed climbing 3-5 steps with a railing? : A Little 6 Click Score: 22    End of Session Equipment Utilized During Treatment: Gait belt Activity Tolerance: Patient tolerated treatment well Patient left: in bed;with call bell/phone within  reach(sitting EOB) Nurse Communication: Mobility status PT Visit Diagnosis: Unsteadiness on feet (R26.81);History of falling (Z91.81);Muscle weakness (generalized) (M62.81);Difficulty in walking, not elsewhere classified (R26.2)     Time: 1245-1311 PT Time Calculation (min) (ACUTE ONLY): 26 min  Charges:  $Gait Training: 8-22 mins $Therapeutic Activity: 8-22 mins                     Maggie Font, PT Acute Rehab Services Pager [phone] [facility] [phone] [facility] [phone]    Karlton Lemon 07/12/2019, 1:33 PM

## 2019-07-12 NOTE — Progress Notes (Signed)
Patient ID: Janet Mitchell, female   DOB: 17-May-1965, 54 y.o.   MRN: [id number] S: complaining of a headache and nausea.  UF of 793 ml overnight with CCPD.  We again discussed transition to IHD however she remains very reluctant and wants to continue with PD for now.   O:BP (!) 141/94   Pulse 65   Temp 98.2 F (36.8 C) (Oral)   Resp 18   Wt 133.3 kg   SpO2 99%   BMI 44.68 kg/m   Intake/Output Summary (Last 24 hours) at 07/12/2019 1539 Last data filed at 07/12/2019 1043 Gross per 24 hour  Intake 10533 ml  Output 10744 ml  Net -211 ml   Intake/Output: I/O last 3 completed shifts: In: 95621 [P.O.:360; Other:10009; IV Piggyback:100] Out: 10800 [Other:10800]  Intake/Output this shift:  Total I/O In: 10413 [Other:10013; IV Piggyback:400] Out: 30865 [Emesis/NG output:1; HQION:62952] Weight change:  Gen: mild distress CVS: no rub Resp: cta Abd: obese, +BS, soft, NT/ND Ext: 2+ lower ext edema  Recent Labs  Lab 07/08/19 1959 07/10/19 0419 07/11/19 0358 07/12/19 0345  NA 136 132* 133* 133*  K 3.7 3.8 4.0 4.0  CL 92* 91* 91* 91*  CO2 27 21* 22 22  GLUCOSE 180* 154* 192* 174*  BUN 67* 70* 69* 69*  CREATININE 11.66* 11.55* 11.60* 12.07*  ALBUMIN 2.3* 2.1* 2.1* 2.2*  CALCIUM 8.5* 8.4* 8.9 9.1  PHOS  --   --  8.4* 8.7*  AST 11* 11*  --   --   ALT 13 10  --   --    Liver Function Tests: Recent Labs  Lab 07/08/19 1959 07/08/19 1959 07/10/19 0419 07/11/19 0358 07/12/19 0345  AST 11*  --  11*  --   --   ALT 13  --  10  --   --   ALKPHOS 57  --  47  --   --   BILITOT 0.8  --  0.8  --   --   PROT 7.5  --  7.0  --   --   ALBUMIN 2.3*   < > 2.1* 2.1* 2.2*   < > = values in this interval not displayed.   No results for input(s): LIPASE, AMYLASE in the last 168 hours. No results for input(s): AMMONIA in the last 168 hours. CBC: Recent Labs  Lab 07/08/19 1959 07/08/19 1959 07/09/19 1320 07/09/19 1320 07/10/19 0419 07/11/19 0358 07/12/19 0345  WBC 7.2   < > 7.0   <  > 7.6 6.7 7.7  NEUTROABS  --   --   --   --  5.1  --   --   HGB 6.5*   < > 7.0*   < > 7.2* 7.2* 7.3*  HCT 22.0*   < > 23.4*   < > 23.9* 24.3* 24.9*  MCV 74.6*  --  76.5*  --  74.9* 76.4* 75.9*  PLT 248   < > 232   < > 200 185 187   < > = values in this interval not displayed.   Cardiac Enzymes: No results for input(s): CKTOTAL, CKMB, CKMBINDEX, TROPONINI in the last 168 hours. CBG: Recent Labs  Lab 07/11/19 1243 07/11/19 1553 07/11/19 2127 07/12/19 0757 07/12/19 1202  GLUCAP 149* 119* 167* 173* 108*    Iron Studies: No results for input(s): IRON, TIBC, TRANSFERRIN, FERRITIN in the last 72 hours. Studies/Results: No results found. Marland Kitchen aspirin EC  81 mg Oral Daily  . calcitRIOL  0.5 mcg Oral Q  MTWThF   And  . [START ON 07/13/2019] calcitRIOL  1 mcg Oral Once per day on Sun Sat  . calcium acetate  1,334 mg Oral TID WC  . gentamicin cream  1 application Topical Daily  . heparin injection (subcutaneous)  5,000 Units Subcutaneous Q8H  . insulin aspart  0-9 Units Subcutaneous TID AC & HS  . insulin glargine  20 Units Subcutaneous Daily  . labetalol  300 mg Oral BID  . losartan  100 mg Oral Daily  . pantoprazole  40 mg Oral Daily  . vancomycin variable dose per unstable renal function (pharmacist dosing)   Does not apply See admin instructions    BMET    Component Value Date/Time   NA 133 (L) 07/12/2019 0345   K 4.0 07/12/2019 0345   CL 91 (L) 07/12/2019 0345   CO2 22 07/12/2019 0345   GLUCOSE 174 (H) 07/12/2019 0345   BUN 69 (H) 07/12/2019 0345   CREATININE 12.07 (H) 07/12/2019 0345   CALCIUM 9.1 07/12/2019 0345   GFRNONAA 3 (L) 07/12/2019 0345   GFRAA 4 (L) 07/12/2019 0345   CBC    Component Value Date/Time   WBC 7.7 07/12/2019 0345   RBC 3.28 (L) 07/12/2019 0345   HGB 7.3 (L) 07/12/2019 0345   HCT 24.9 (L) 07/12/2019 0345   PLT 187 07/12/2019 0345   MCV 75.9 (L) 07/12/2019 0345   MCH 22.3 (L) 07/12/2019 0345   MCHC 29.3 (L) 07/12/2019 0345   RDW 19.9 (H)  07/12/2019 0345   LYMPHSABS 1.2 07/10/2019 0419   MONOABS 1.0 07/10/2019 0419   EOSABS 0.3 07/10/2019 0419   BASOSABS 0.1 07/10/2019 0419    [facility]. PD-5 cycles overnight dwell volume of 2000-using 2.5 and 4.25 of late- Extraneallast bag fill  Assessment/Plan:54 year old black female with ESRD on PD-presenting with symptomatic anemia. However, found to have peritonitis and significant volume overload with low albumin  1. Acute on chronicanemia-hemoglobin 6.5on adm. S/P 1 unit PRBCs 07/08/19. HGB ^ 7.2 today. Iron load ordered in spite of peritonitis. Rec'd Aranesp 200 mcg SQ 04/13. Follow HGB, tranfuse as needed.  2. Peritonitis- cultures with MRSE and already received 2 g of vancomycin and 2 g of Fortaz.  Will just continue with vancomycin given cultures.  Low random vanc level and to be redosed today by pharmacy.  3. ESRD on CCPD. Had been told to use 2.5% solution and 4.25 % solution by OP provider but was admitted here before she attempted this. Had rapid cycling CCPD on adm for peritonitis.Useall 4.25% solution tonight since she only UF'd 839 ml overnight but did not have as much volume removed as expected. Did have drip and BP and now with lightheadedness. Post wt today 133.3. Will use 1/2 2.25% and half 4.25% dialysate this evening.  Albumin very low. Discussed changing from PD to HD until albumin/volume status improves but she continues to decline. 1. Will increase to 6 exchanges and decrease dwell time to 1.5 hrs and see if this improves.  4. Diarrhea-C Diff antigen positive, toxin negative. Says she has had diarrhea for over two years. On oral vanc per phamary which was DC'd today per primary. 5. Hypertension:Hypertensive and volume overloadedon admission. Only 0.8 kg removed last PM but some improvement in facial edema, decreased abdominal girth but still 2-3+ BLE edema. SBP down to 118/60 which is low for her-she is slightly  symptomatic. PD dialysate adjusted.  6. Metabolic Bone Disease:Continue home calcitriol and PhosLo 7. Nutrition: albumin  very low. Add prostat, dietician to see pt. 8. DM-per primary  Donetta Potts, MD [facility] [phone]

## 2019-07-12 NOTE — Progress Notes (Signed)
PROGRESS NOTE        PATIENT DETAILS Name: Janet Mitchell Age: 54 y.o. Sex: female Date of Birth: 1965/12/17 Admit Date: 07/08/2019 Admitting Physician Darliss Cheney, MD [id number], Myra Rude, MD  Brief Narrative: Patient is a 54 y.o. female with history of ESRD on PD, HTN, DM-2, chronic diarrhea for the past 2 years-sent to [facility] ED for low hemoglobin/hematocrit and evaluation of worsening abdominal pain.  Found to have a hemoglobin of 6.5 and PD catheter associated peritonitis.  See below for further details.   Significant events: 4/12>> admit to [facility] for severe anemia and PD catheter associated peritonitis  Antimicrobial therapy: Vancomycin: 4/13>> Rocephin: 4/13>>4/15  Microbiology data: 4/13>> peritoneal fluid culture: Gram-positive cocci  Procedures : None  Consults: Nephrology  DVT Prophylaxis : Prophylactic Heparin   Subjective: No BM for the past 48 hours.  Hardly any abdominal pain today.  Lying comfortably in bed.  Assessment/Plan: Symptomatic anemia: No indication of blood loss-suspect related to underlying CKD.  Patient is s/p 1 unit of PRBC transfusion-IV iron/Aranesp per nephrology.  PD catheter associated peritonitis: Abdominal pain has improved-exam is benign-peritoneal fluid culture positive for staph epidermidis-continue IV vancomycin.  Diarrhea: Chronic issue-do not think she has C. difficile colitis.  She was in fact "constipated" for 2 weeks prior to admission-following which she had around 4-5 bowel movements (baseline) on the day of admission.  She has had no BM in the past 2 days.  CT abdomen on 4/13 without any colitis.  Suspect C. difficile's are more indicative of colonization rather than infection.  No longer on oral vancomycin.  Per patient-she has seen GI MD in [place] for chronic diarrhea-and at one point was placed on cholestyramine that she stopped taking.  ESRD on PD: Nephrology following-concerned that she is just  not getting enough volume removed with PD-nephrology contemplating switching over to HD.  Will await further recommendations from nephrology.  Anasarca: Nephrology following-volume removal with PD.  HTN: Controlled-continue labetalol, losartan  Insulin-dependent DM-2: CBG stable-continue Lantus 20 units daily and SSI.  Follow and optimize  Recent Labs    07/11/19 2127 07/12/19 0757 07/12/19 1202  GLUCAP 167* 173* 108*   GERD: Continue PPI  Obesity: Estimated body mass index is 44.68 kg/m as calculated from the following:   Height as of 05/01/19: 5\' 8"  (1.727 m).   Weight as of this encounter: 133.3 kg.    Diet: Diet Order            Diet renal/carb modified with fluid restriction Diet-HS Snack? Nothing; Fluid restriction: 1200 mL Fluid; Room service appropriate? Yes; Fluid consistency: Thin  Diet effective now              Code Status: Full code   Family Communication: Spouse over the phone on 4/15-we will update tomorrow  Disposition Plan: Likely home with home health services on discharge  Barriers to Discharge: PD catheter associated peritonitis requiring IV antibiotics, severe anemia, anasarca-may require switch to hemodialysis.  Antimicrobial agents: Anti-infectives (From admission, onward)   Start     Dose/Rate Route Frequency Ordered Stop   07/12/19 0845  vancomycin (VANCOREADY) IVPB 2000 mg/400 mL     2,000 mg 200 mL/hr over 120 Minutes Intravenous  Once 07/12/19 0837 07/12/19 1243   07/11/19 1456  vancomycin variable dose per unstable renal function (pharmacist dosing)      Does  not apply See admin instructions 07/11/19 1456     07/09/19 1800  vancomycin (VANCOCIN) 50 mg/mL oral solution 125 mg  Status:  Discontinued     125 mg Oral 4 times daily 07/09/19 1614 07/10/19 1546   07/09/19 1700  cefTRIAXone (ROCEPHIN) 2 g in sodium chloride 0.9 % 100 mL IVPB  Status:  Discontinued     2 g 200 mL/hr over 30 Minutes Intravenous Every 24 hours 07/09/19 1614  07/11/19 1451   07/09/19 1400  cefTAZidime (FORTAZ) 2 g in sodium chloride 0.9 % 100 mL IVPB     2 g 200 mL/hr over 30 Minutes Intravenous  Once 07/09/19 1354 07/09/19 1900   07/09/19 1345  vancomycin (VANCOREADY) IVPB 2000 mg/400 mL    Note to Pharmacy: For peritonitis in PD patient   2,000 mg 200 mL/hr over 120 Minutes Intravenous  Once 07/09/19 1336 07/09/19 1851   07/09/19 1345  cefTAZidime (FORTAZ) 2 g in sodium chloride 0.9 % 100 mL IVPB  Status:  Discontinued    Note to Pharmacy: For peritonitis in PD patient   2 g 200 mL/hr over 30 Minutes Intravenous  Once 07/09/19 1336 07/09/19 1354       Time spent: 25 minutes-Greater than 50% of this time was spent in counseling, explanation of diagnosis, planning of further management, and coordination of care.  MEDICATIONS: Scheduled Meds: . aspirin EC  81 mg Oral Daily  . calcitRIOL  0.5 mcg Oral Q [id number]   And  . [START ON 07/13/2019] calcitRIOL  1 mcg Oral Once per day on Sun Sat  . calcium acetate  1,334 mg Oral TID WC  . gentamicin cream  1 application Topical Daily  . heparin injection (subcutaneous)  5,000 Units Subcutaneous Q8H  . insulin aspart  0-9 Units Subcutaneous TID AC & HS  . insulin glargine  20 Units Subcutaneous Daily  . labetalol  300 mg Oral BID  . losartan  100 mg Oral Daily  . pantoprazole  40 mg Oral Daily  . vancomycin variable dose per unstable renal function (pharmacist dosing)   Does not apply See admin instructions   Continuous Infusions: . dialysis solution 2.5% low-MG/low-CA    . dialysis solution 4.25% low-MG/low-CA     PRN Meds:.acetaminophen, dianeal solution for CAPD/CCPD with heparin, dianeal solution for CAPD/CCPD with heparin, hydrALAZINE, ondansetron **OR** ondansetron (ZOFRAN) IV, oxyCODONE-acetaminophen, polyethylene glycol   PHYSICAL EXAM: Vital signs: Vitals:   07/11/19 2122 07/12/19 0117 07/12/19 0425 07/12/19 1434  BP: (!) 106/51 (!) 116/57 (!) 120/92 (!) 141/94  Pulse: 60 61 65  65  Resp:   18 18  Temp: 97.8 F (36.6 C)  97.8 F (36.6 C) 98.2 F (36.8 C)  TempSrc: Oral   Oral  SpO2: 98% (!) 88% 97% 99%  Weight:       Filed Weights   07/10/19 0338 07/11/19 0424  Weight: 134.1 kg 133.3 kg   Body mass index is 44.68 kg/m.   Gen Exam:Alert awake-not in any distress HEENT:atraumatic, normocephalic Chest: B/L clear to auscultation anteriorly CVS:S1S2 regular Abdomen:soft non tender, non distended Extremities:++ edema Neurology: Non focal Skin: no rash  I have personally reviewed following labs and imaging studies  LABORATORY DATA: CBC: Recent Labs  Lab 07/08/19 1959 07/09/19 1320 07/10/19 0419 07/11/19 0358 07/12/19 0345  WBC 7.2 7.0 7.6 6.7 7.7  NEUTROABS  --   --  5.1  --   --   HGB 6.5* 7.0* 7.2* 7.2* 7.3*  HCT 22.0* 23.4* 23.9*  24.3* 24.9*  MCV 74.6* 76.5* 74.9* 76.4* 75.9*  PLT 248 232 200 185 494    Basic Metabolic Panel: Recent Labs  Lab 07/08/19 1959 07/10/19 0419 07/11/19 0358 07/12/19 0345  NA 136 132* 133* 133*  K 3.7 3.8 4.0 4.0  CL 92* 91* 91* 91*  CO2 27 21* 22 22  GLUCOSE 180* 154* 192* 174*  BUN 67* 70* 69* 69*  CREATININE 11.66* 11.55* 11.60* 12.07*  CALCIUM 8.5* 8.4* 8.9 9.1  MG  --  1.6*  --   --   PHOS  --   --  8.4* 8.7*    GFR: Estimated Creatinine Clearance: 8 mL/min (A) (by C-G formula based on SCr of 12.07 mg/dL (H)).  Liver Function Tests: Recent Labs  Lab 07/08/19 1959 07/10/19 0419 07/11/19 0358 07/12/19 0345  AST 11* 11*  --   --   ALT 13 10  --   --   ALKPHOS 57 47  --   --   BILITOT 0.8 0.8  --   --   PROT 7.5 7.0  --   --   ALBUMIN 2.3* 2.1* 2.1* 2.2*   No results for input(s): LIPASE, AMYLASE in the last 168 hours. No results for input(s): AMMONIA in the last 168 hours.  Coagulation Profile: No results for input(s): INR, PROTIME in the last 168 hours.  Cardiac Enzymes: No results for input(s): CKTOTAL, CKMB, CKMBINDEX, TROPONINI in the last 168 hours.  BNP (last 3  results) No results for input(s): PROBNP in the last 8760 hours.  Lipid Profile: No results for input(s): CHOL, HDL, LDLCALC, TRIG, CHOLHDL, LDLDIRECT in the last 72 hours.  Thyroid Function Tests: No results for input(s): TSH, T4TOTAL, FREET4, T3FREE, THYROIDAB in the last 72 hours.  Anemia Panel: No results for input(s): VITAMINB12, FOLATE, FERRITIN, TIBC, IRON, RETICCTPCT in the last 72 hours.  Urine analysis:    Component Value Date/Time   COLORURINE AMBER (A) 07/31/2018 1800   APPEARANCEUR TURBID (A) 07/31/2018 1800   LABSPEC >1.046 (H) 07/31/2018 1800   PHURINE 5.0 07/31/2018 1800   GLUCOSEU 150 (A) 07/31/2018 1800   HGBUR SMALL (A) 07/31/2018 1800   BILIRUBINUR NEGATIVE 07/31/2018 1800   KETONESUR NEGATIVE 07/31/2018 1800   PROTEINUR >=300 (A) 07/31/2018 1800   NITRITE NEGATIVE 07/31/2018 1800   LEUKOCYTESUR MODERATE (A) 07/31/2018 1800    Sepsis Labs: Lactic Acid, Venous No results found for: LATICACIDVEN  MICROBIOLOGY: Recent Results (from the past 240 hour(s))  SARS CORONAVIRUS 2 (TAT 6-24 HRS) Nasopharyngeal Nasopharyngeal Swab     Status: None   Collection Time: 07/08/19 10:04 PM   Specimen: Nasopharyngeal Swab  Result Value Ref Range Status   SARS Coronavirus 2 NEGATIVE NEGATIVE Final    Comment: (NOTE) SARS-CoV-2 target nucleic acids are NOT DETECTED. The SARS-CoV-2 RNA is generally detectable in upper and lower respiratory specimens during the acute phase of infection. Negative results do not preclude SARS-CoV-2 infection, do not rule out co-infections with other pathogens, and should not be used as the sole basis for treatment or other patient management decisions. Negative results must be combined with clinical observations, patient history, and epidemiological information. The expected result is Negative. Fact Sheet for Patients: SugarRoll.be Fact Sheet for Healthcare  Providers: https://www.woods-mathews.com/ This test is not yet approved or cleared by the [place] FDA and  has been authorized for detection and/or diagnosis of SARS-CoV-2 by FDA under an Emergency Use Authorization (EUA). This EUA will remain  in effect (meaning this test can be  used) for the duration of the COVID-19 declaration under Section 56 4(b)(1) of the Act, 21 U.S.C. section 360bbb-3(b)(1), unless the authorization is terminated or revoked sooner. Performed at [facility], [place] [address], [place], [place] [address]   Gram stain     Status: None   Collection Time: 07/09/19  8:55 AM   Specimen: Abdomen; Peritoneal Fluid  Result Value Ref Range Status   Specimen Description ABDOMEN  Final   Special Requests NONE  Final   Gram Stain   Final    WBC PRESENT,BOTH PMN AND MONONUCLEAR GRAM POSITIVE COCCI CYTOSPIN SMEAR Gram Stain Report Called to,Read Back By and Verified WithKym Groom RN 1700 07/09/19 A BROWNING Performed at [facility], [place] [address], [place], [place] [address]    Report Status 07/09/2019 FINAL  Final  Culture, body fluid-bottle     Status: Abnormal   Collection Time: 07/09/19  8:55 AM   Specimen: Peritoneal Washings  Result Value Ref Range Status   Specimen Description PERITONEAL ABDOMEN  Final   Special Requests NONE  Final   Gram Stain   Final    GRAM POSITIVE COCCI IN CLUSTERS IN BOTH AEROBIC AND ANAEROBIC BOTTLES CRITICAL RESULT CALLED TO, READ BACK BY AND VERIFIED WITH: Manya Silvas [id number] 07/10/2019 Mena Goes Performed at [facility], [place] [address], [place], [place] [address]    Culture STAPHYLOCOCCUS EPIDERMIDIS (A)  Final   Report Status 07/12/2019 FINAL  Final   Organism ID, Bacteria STAPHYLOCOCCUS EPIDERMIDIS  Final      Susceptibility   Staphylococcus epidermidis - MIC*    CIPROFLOXACIN 4 RESISTANT Resistant     ERYTHROMYCIN >=8 RESISTANT Resistant     GENTAMICIN <=0.5 SENSITIVE Sensitive     OXACILLIN  >=4 RESISTANT Resistant     TETRACYCLINE 2 SENSITIVE Sensitive     VANCOMYCIN 1 SENSITIVE Sensitive     TRIMETH/SULFA 20 SENSITIVE Sensitive     CLINDAMYCIN >=8 RESISTANT Resistant     RIFAMPIN <=0.5 SENSITIVE Sensitive     Inducible Clindamycin NEGATIVE Sensitive     * STAPHYLOCOCCUS EPIDERMIDIS  C Difficile Quick Screen w PCR reflex     Status: Abnormal   Collection Time: 07/09/19  2:27 PM   Specimen: STOOL  Result Value Ref Range Status   C Diff antigen POSITIVE (A) NEGATIVE Final   C Diff toxin NEGATIVE NEGATIVE Final   C Diff interpretation Results are indeterminate. See PCR results.  Final    Comment: Performed at [facility], [place] [address], [place], [place] [address]  C. Diff by PCR, Reflexed     Status: Abnormal   Collection Time: 07/09/19  2:27 PM  Result Value Ref Range Status   Toxigenic C. Difficile by PCR POSITIVE (A) NEGATIVE Final    Comment: Positive for toxigenic C. difficile with little to no toxin production. Only treat if clinical presentation suggests symptomatic illness. Performed at [facility], [place] [address] [place], [place] [address]     RADIOLOGY STUDIES/RESULTS: No results found.   LOS: 3 days   Oren Binet, MD  [facility]    To contact the attending provider between 7A-7P or the covering provider during after hours 7P-7A, please log into the web site [email or web address] and access using universal [place] password for that web site. If you do not have the password, please call the hospital operator.  07/12/2019, 3:38 PM

## 2019-07-12 NOTE — Progress Notes (Signed)
Pharmacy Antibiotic Note  Janet Mitchell is a 54 y.o. female admitted on 07/08/2019 with Peritonitis.  Pharmacy has been consulted for Vancomycin dosing.  ID: Staphylococcus epidermidis (oxacillin resistant) peritonitis, Afebrile. WBC is within normal limits.   Vancomycin random level this AM is 10 - low for goals. Per Nephrologist notes, considering potential to switch from CCPD to HD due to low removal rates and concern of membrane failure. Discussed giving IV dose now due to low level with Dr. Marval Regal, Nephrologist who is in agreement with this plan.   Vanco 2g IV x 1 on 4/13>> Fortaz 4/13 x 1 Rocephin 4/13>>4/15 Vanco oral 4/13 QID>>4/23  4/13: Peritoneal washings: Staph Epi. 4/13: Cdiff: POSITIVE 4/12: COVID negative  Plan: Give Vancomycin 2000mg  (~15mg /kg) IV x1 now due to low level.  Follow-up plan for continuing CCPD versus HD.  If received HD, will likely need a dose after HD session.  If continues on CCPD, will plan for level on Monday.    Weight: 133.3 kg (293 lb 14 oz)  Temp (24hrs), Avg:97.8 F (36.6 C), Min:97.7 F (36.5 C), Max:98.3 F (36.8 C)  Recent Labs  Lab 07/08/19 1959 07/09/19 1320 07/10/19 0419 07/11/19 0358 07/12/19 0345  WBC 7.2 7.0 7.6 6.7 7.7  CREATININE 11.66*  --  11.55* 11.60* 12.07*  VANCORANDOM  --   --   --   --  10    Estimated Creatinine Clearance: 8 mL/min (A) (by C-G formula based on SCr of 12.07 mg/dL (H)).    No Known Allergies  Sloan Leiter, PharmD, BCPS, BCCCP Clinical Pharmacist Please refer to [facility] for [place] numbers 07/12/2019 8:36 AM

## 2019-07-12 NOTE — TOC Initial Note (Signed)
Transition of Care [facility]) - Initial/Assessment Note    Patient Details  Name: Janet Mitchell MRN: [id number] Date of Birth: June 07, 1965  [facility] CM/SW Contact:    Maryclare Labrador, RN Phone Number: 07/12/2019, 4:59 PM  Clinical Narrative:   PTA Independent from home with husband.  Pt is on PD at home.   Pt states she has a PCP and denied barriers with paying for medications.                      Patient Goals and CMS Choice        Expected Discharge Plan and Services         Living arrangements for the past 2 months: Single Family Home                                      Prior Living Arrangements/Services Living arrangements for the past 2 months: Single Family Home Lives with:: Spouse Patient language and need for interpreter reviewed:: Yes Do you feel safe going back to the place where you live?: Yes        Care giver support system in place?: Yes (comment)   Criminal Activity/Legal Involvement Pertinent to Current Situation/Hospitalization: No - Comment as needed  Activities of Daily Living Home Assistive Devices/Equipment: Other (Comment) ADL Screening (condition at time of admission) Patient's cognitive ability adequate to safely complete daily activities?: Yes Is the patient deaf or have difficulty hearing?: No Does the patient have difficulty seeing, even when wearing glasses/contacts?: No Does the patient have difficulty concentrating, remembering, or making decisions?: No Patient able to express need for assistance with ADLs?: No Does the patient have difficulty dressing or bathing?: No Independently performs ADLs?: Yes (appropriate for developmental age) Does the patient have difficulty walking or climbing stairs?: No Weakness of Legs: Both Weakness of Arms/Hands: None  Permission Sought/Granted                  Emotional Assessment   Attitude/Demeanor/Rapport: Gracious, Engaged Affect (typically observed):  Accepting Orientation: : Oriented to Self, Oriented to Place, Oriented to  Time, Oriented to Situation      Admission diagnosis:  Shortness of breath [R06.02] SOB (shortness of breath) [R06.02] Peritonitis (HCC) [K65.9] Ascites [R18.8] Symptomatic anemia [D64.9] Patient Active Problem List   Diagnosis Date Noted  . Ascites 07/09/2019  . Acute blood loss anemia   . Hyperkalemia   . Hypotension   . Vertigo   . Black stool 04/04/2019  . Uremia, acute   . Weakness   . COVID-19 03/26/2019  . Chronic diarrhea 03/25/2019  . Uncontrolled type II diabetes mellitus with chronic kidney disease ([place])   . Hypokalemia   . Hyponatremia   . Anemia in ESRD (end-stage renal disease) ([place])   . Essential hypertension, benign 02/14/2019  . Class 2 severe obesity due to excess calories with serious comorbidity and body mass index (BMI) of 37.0 to 37.9 in adult [facility]) 02/14/2019  . SBP (spontaneous bacterial peritonitis) ([place]) 12/18/2018  . Generalized abdominal pain 11/28/2018  . Rectal burning 08/27/2018  . Rectal bleeding 08/27/2018  . Diarrhea 05/22/2018  . Fluid overload 03/25/2018  . Microcytic anemia 03/25/2018  . Chronic cholecystitis with calculus 03/09/2018  . Type 2 diabetes mellitus with ESRD (end-stage renal disease) ([place]) 03/09/2018  . Pre-transplant evaluation for ESRD (end stage renal disease) 12/19/2017  . Symptomatic anemia  11/06/2017  . Essential hypertension 11/06/2017  . ESRD on peritoneal dialysis ([place]) 11/06/2017  . Diabetic macular edema ([place]) 04/10/2015  . Edema of lower extremity 04/10/2015  . Uncontrolled type 2 diabetes mellitus ([place]) 04/10/2015   PCP:  Lucianne Lei, MD Pharmacy:   [place], [place] [address] Phone: [phone] Fax: [phone]  [facility] [place], [place] - [address] [place] [address] Phone: [phone] Fax:  [phone]     Social Determinants of Health (SDOH) Interventions    Readmission Risk Interventions Readmission Risk Prevention Plan 07/12/2019 04/05/2019  Transportation Screening Complete Complete  Medication Review (RN Care Manager) - Complete  [place] Not Applicable Patient Refused  Some recent data might be hidden

## 2019-07-12 NOTE — Progress Notes (Signed)
Occupational Therapy Evaluation Patient Details Name: Janet Mitchell MRN: [id number] DOB: 1965-12-02 Today's Date: 07/12/2019    History of Present Illness Patient is a 54 y.o. female with history of ESRD on PD, HTN, DM-2, chronic diarrhea for the past 2 years-sent to [facility] ED for low hemoglobin/hematocrit and evaluation of worsening abdominal pain.  Found to have a hemoglobin of 6.5 and PD catheter associated peritonitis.   Clinical Impression   Pt received in bed, Independent for bed mobility to sit EOB. Upon sitting up, pt became nauseous and vomited. Pt also limited by reports of severe headache, though motivated to participate in therapy. Pt demonstrated mobility in room without AD a min guard, unsteadiness and reaching for furniture noted. Reinforced that RW may be safest option for mobility for now. Pt reports she will "do whatever it takes to get better and go home". Pt requires up to Mod A for LB ADLs due to weakness and swelling in B LEs. Suspect once nausea/headache passes, pt will have improved overall functional status. Recommend HHOT to follow up at DC to maximize safety at home. Will continue to follow acutely.     Follow Up Recommendations  Home health OT;Supervision - Intermittent    Equipment Recommendations  None recommended by OT    Recommendations for Other Services       Precautions / Restrictions Precautions Precautions: Fall Restrictions Weight Bearing Restrictions: No      Mobility Bed Mobility Overal bed mobility: Independent                Transfers Overall transfer level: Needs assistance Equipment used: None Transfers: Sit to/from Stand;Stand Pivot Transfers Sit to Stand: Min guard;From elevated surface Stand pivot transfers: Min guard       General transfer comment: min guard without AD, some unsteadiness noted     Balance Overall balance assessment: Needs assistance Sitting-balance support: Feet supported;No upper extremity  supported Sitting balance-Leahy Scale: Normal Sitting balance - Comments: Increased time sitting at EOB with supervision due to HA and nausea- needed to rest before walking   Standing balance support: During functional activity;Bilateral upper extremity supported Standing balance-Leahy Scale: Fair                             ADL either performed or assessed with clinical judgement   ADL Overall ADL's : Needs assistance/impaired Eating/Feeding: Independent;Sitting   Grooming: Min guard;Standing   Upper Body Bathing: Minimal assistance;Sitting   Lower Body Bathing: Sit to/from stand;Sitting/lateral leans;Moderate assistance   Upper Body Dressing : Set up;Sitting   Lower Body Dressing: Moderate assistance;Sitting/lateral leans;Sit to/from stand   Toilet Transfer: Min guard;Stand-pivot;BSC   Toileting- Water quality scientist and Hygiene: Min guard;Sit to/from stand;Sitting/lateral lean       Functional mobility during ADLs: Min guard General ADL Comments: Pt limited with LB ADLs due to weakness, nausea, headache, as well as B LE swelling      Vision         Perception     Praxis      Pertinent Vitals/Pain Pain Assessment: Faces Faces Pain Scale: Hurts even more Pain Location: headache w/nausea Pain Descriptors / Indicators: Headache Pain Intervention(s): Limited activity within patient's tolerance;Monitored during session     Hand Dominance Right   Extremity/Trunk Assessment Upper Extremity Assessment Upper Extremity Assessment: RUE deficits/detail;LUE deficits/detail RUE Deficits / Details: Strength 4/5 LUE Deficits / Details: Strength 4/5   Lower Extremity Assessment Lower Extremity Assessment: Defer to  PT evaluation       Communication Communication Communication: No difficulties   Cognition Arousal/Alertness: Awake/alert Behavior During Therapy: WFL for tasks assessed/performed Overall Cognitive Status: Within Functional Limits for tasks  assessed                                     General Comments       Exercises     Shoulder Instructions      Home Living Family/patient expects to be discharged to:: Private residence Living Arrangements: Spouse/significant other Available Help at Discharge: Family Type of Home: House Home Access: Stairs to enter Technical brewer of Steps: 3 Entrance Stairs-Rails: Left Home Layout: One level     Bathroom Shower/Tub: Teacher, early years/pre: Scissors: Tub bench;Walker - 2 wheels;Wheelchair - manual;Cane - single point(suction grab bars in shower )          Prior Functioning/Environment Level of Independence: Needs assistance  Gait / Transfers Assistance Needed: Reports 3-4 falls in recent months. Been using a w/c for community mobility ADL's / Homemaking Assistance Needed: Has been completing ADL's in difficulty    Comments: pt has been very weak with almost no ambulation since Covid hospitalization, prior to that able to ambulate and do ADLs w/o assist or AD. States husband has been helping some with LB ADLs since COVID hospitalization        OT Problem List: Decreased strength;Decreased activity tolerance;Impaired balance (sitting and/or standing);Decreased knowledge of use of DME or AE      OT Treatment/Interventions: Self-care/ADL training;Therapeutic exercise;Energy conservation;DME and/or AE instruction;Therapeutic activities;Patient/family education    OT Goals(Current goals can be found in the care plan section) Acute Rehab OT Goals Patient Stated Goal: get well enough to go home OT Goal Formulation: With patient Time For Goal Achievement: 07/26/19 Potential to Achieve Goals: Good ADL Goals Pt Will Perform Grooming: with modified independence;standing Pt Will Perform Upper Body Bathing: with modified independence;standing;sitting Pt Will Perform Lower Body Bathing: with supervision;sitting/lateral  leans;sit to/from stand Pt Will Transfer to Toilet: with supervision;ambulating;regular height toilet Pt Will Perform Toileting - Clothing Manipulation and hygiene: with modified independence;sit to/from stand;sitting/lateral leans  OT Frequency: Min 2X/week   Barriers to D/C:            Co-evaluation              AM-PAC OT "6 Clicks" Daily Activity     Outcome Measure Help from another person eating meals?: None Help from another person taking care of personal grooming?: A Little Help from another person toileting, which includes using toliet, bedpan, or urinal?: A Little Help from another person bathing (including washing, rinsing, drying)?: A Little Help from another person to put on and taking off regular upper body clothing?: A Little Help from another person to put on and taking off regular lower body clothing?: A Little 6 Click Score: 19   End of Session Equipment Utilized During Treatment: Gait belt Nurse Communication: Mobility status;Other (comment)(nausea)  Activity Tolerance: Patient limited by fatigue;Patient limited by pain Patient left: in bed;with call bell/phone within reach  OT Visit Diagnosis: Unsteadiness on feet (R26.81);Other abnormalities of gait and mobility (R26.89);Muscle weakness (generalized) (M62.81);History of falling (Z91.81)                Time: 0950-1030 OT Time Calculation (min): 40 min Charges:  OT General Charges $OT Visit: 1  Visit OT Evaluation $OT Eval Moderate Complexity: 1 Mod OT Treatments $Therapeutic Activity: 23-37 mins  Layla Maw, OTR/L  Layla Maw 07/12/2019, 1:56 PM

## 2019-07-12 NOTE — Progress Notes (Signed)
PT EVALUATION (late entry note)  Prior to admission, pt lives with her spouse, has been independent with ADL's, and "furniture walking," for household ambulation. Uses a w/c for longer distance mobility. Endorses frequent falls. On PT evaluation, pt presents with BLE edema, balance impairments, and weakness. Ambulating 25 feet with a walker at a min assist level. Recommended use of walker for ambulation. Currently recommending HHPT and suspect continued progress. Has good family support.     Wyona Almas, PT, DPT Acute Rehabilitation Services Pager [phone] Office [phone]   07/11/19 1605  PT Visit Information  Last PT Received On 07/11/19  Assistance Needed +1  History of Present Illness Patient is a 54 y.o. female with history of ESRD on PD, HTN, DM-2, chronic diarrhea for the past 2 years-sent to [facility] ED for low hemoglobin/hematocrit and evaluation of worsening abdominal pain.  Found to have a hemoglobin of 6.5 and PD catheter associated peritonitis.  Precautions  Precautions Fall  Restrictions  Weight Bearing Restrictions No  Home Living  Family/patient expects to be discharged to: Private residence  Living Arrangements Spouse/significant other  Available Help at Discharge Family  Type of [place] to enter  Entrance Stairs-Number of Steps 3  Entrance Stairs-Rails Left  [place] One level  Bathroom Therapist, music Tub bench  Prior Function  Level of Independence Needs assistance  Gait / Transfers Assistance Needed Reports 3-4 falls in recent months. Been using a w/c for community mobility  ADL's / Homemaking Assistance Needed Has been completing ADL's in difficulty   Communication  Communication No difficulties  Pain Assessment  Pain Assessment Faces  Faces Pain Scale 2  Pain Location headache  Pain Descriptors / Indicators Headache  Pain Intervention(s) Monitored during session   Cognition  Arousal/Alertness Awake/alert  Behavior During Therapy WFL for tasks assessed/performed  Overall Cognitive Status Within Functional Limits for tasks assessed  Upper Extremity Assessment  Upper Extremity Assessment RUE deficits/detail;LUE deficits/detail  RUE Deficits / Details Strength 4/5  LUE Deficits / Details Strength 4/5  Lower Extremity Assessment  Lower Extremity Assessment RLE deficits/detail;LLE deficits/detail  RLE Deficits / Details Strength 5/5 except hip flexion 2+/5  LLE Deficits / Details Strength 5/5 except hip flexion 2+/5  Bed Mobility  General bed mobility comments Sitting EOB on arrival  Transfers  Overall transfer level Needs assistance  Equipment used Rolling walker (2 wheeled)  Transfers Sit to/from Stand  Sit to Stand Min guard  General transfer comment Min guard from elevated surface  Ambulation/Gait  Ambulation/Gait assistance Min assist  Gait Distance (Feet) 25 Feet  Assistive device Rolling walker (2 wheeled)  Gait Pattern/deviations Step-through pattern;Decreased stride length;Wide base of support  General Gait Details Pt reaching for external support, so transitioned to walker after a couple of steps. Requiring minA for stability, increased lateral sway, wide BOS, slow speed  Gait velocity decreased  Balance  Overall balance assessment Needs assistance  Sitting-balance support Feet supported  Sitting balance-Leahy Scale Good  Standing balance support No upper extremity supported;During functional activity  Standing balance-Leahy Scale Poor  PT - End of Session  Activity Tolerance Patient tolerated treatment well  Patient left in bed;with call bell/phone within reach  Nurse Communication Mobility status  PT Assessment  PT Recommendation/Assessment Patient needs continued PT services  PT Visit Diagnosis Unsteadiness on feet (R26.81);History of falling (Z91.81);Muscle weakness (generalized) (M62.81);Difficulty in walking, not elsewhere  classified (R26.2)  PT Problem List Decreased strength;Decreased balance;Decreased  activity tolerance;Decreased mobility  PT Plan  PT Frequency (ACUTE ONLY) Min 3X/week  PT Treatment/Interventions (ACUTE ONLY) DME instruction;Gait training;Functional mobility training;Therapeutic activities;Therapeutic exercise;Stair training;Balance training;Patient/family education  AM-PAC PT "6 Clicks" Mobility Outcome Measure (Version 2)  Help needed turning from your back to your side while in a flat bed without using bedrails? 4  Help needed moving from lying on your back to sitting on the side of a flat bed without using bedrails? 3  Help needed moving to and from a bed to a chair (including a wheelchair)? 3  Help needed standing up from a chair using your arms (e.g., wheelchair or bedside chair)? 3  Help needed to walk in hospital room? 3  Help needed climbing 3-5 steps with a railing?  2  6 Click Score 18  Consider Recommendation of Discharge To: Home with [facility]  PT Recommendation  Follow Up Recommendations Home health PT;Supervision/Assistance - 24 hour  PT equipment None recommended by PT  Individuals Consulted  Consulted and Agree with Results and Recommendations Patient  Acute Rehab PT Goals  Patient Stated Goal "remain on PD."  PT Goal Formulation With patient  Time For Goal Achievement 07/25/19  Potential to Achieve Goals Good  PT Time Calculation  PT Start Time (ACUTE ONLY) 1600  PT Stop Time (ACUTE ONLY) 1613  PT Time Calculation (min) (ACUTE ONLY) 13 min  PT General Charges  $$ ACUTE PT VISIT 1 Visit  PT Evaluation  $PT Eval Moderate Complexity 1 Mod  Written Expression  Dominant Hand Right

## 2019-07-12 NOTE — Consult Note (Signed)
   [facility] CM Inpatient Consult   07/12/2019  Janet Mitchell 29-Jun-1965 [id number]   Patient screened for extreme high risk score for unplanned readmission score of 37% and for 3 hospitalizations in the past 6 months with noted chronic disease.  Call patient's room to check if potential [place] Management services are needed was unable to reach patient by phone.   Review of patient's medical record reveals patient is with 54 year old with HX of ESRD. Patient admitted with complaints of shortness of breath with abdominal pain. Patient was found with low hemoglobin.    Review for community follow up needs.  Patient was COVID negative however,  noted in encounters patient has had her 1st vaccine 06/08/19 and was schedule for her 2nd vaccine was scheduled on 07/10/19 but was hospitalized.  Primary Care Provider is listed with Dr. Lucianne Lei, MD and this office is listed to provide the Transition of Care follow up in [facility].  Plan:  Will continue to follow for progress and disposition to assess for post hospital care management needs, if appropriate.    Please place a [facility] Care Management consult as appropriate and for questions contact:   Natividad Brood, RN BSN [facility] Liaison  [phone] business mobile phone Toll free office [phone]  Fax number: [phone] [email or web address]

## 2019-07-13 LAB — RENAL FUNCTION PANEL
Albumin: 2.3 g/dL — ABNORMAL LOW (ref 3.5–5.0)
Anion gap: 21 — ABNORMAL HIGH (ref 5–15)
BUN: 67 mg/dL — ABNORMAL HIGH (ref 6–20)
CO2: 21 mmol/L — ABNORMAL LOW (ref 22–32)
Calcium: 9.1 mg/dL (ref 8.9–10.3)
Chloride: 92 mmol/L — ABNORMAL LOW (ref 98–111)
Creatinine, Ser: 12.23 mg/dL — ABNORMAL HIGH (ref 0.44–1.00)
GFR calc Af Amer: 4 mL/min — ABNORMAL LOW (ref >60)
GFR calc non Af Amer: 3 mL/min — ABNORMAL LOW (ref >60)
Glucose, Bld: 114 mg/dL — ABNORMAL HIGH (ref 70–99)
Phosphorus: 7.8 mg/dL — ABNORMAL HIGH (ref 2.5–4.6)
Potassium: 3.7 mmol/L (ref 3.5–5.1)
Sodium: 134 mmol/L — ABNORMAL LOW (ref 135–145)

## 2019-07-13 LAB — GLUCOSE, CAPILLARY
Glucose-Capillary: 112 mg/dL — ABNORMAL HIGH (ref 70–99)
Glucose-Capillary: 91 mg/dL (ref 70–99)
Glucose-Capillary: 56 mg/dL — ABNORMAL LOW (ref 70–99)
Glucose-Capillary: 67 mg/dL — ABNORMAL LOW (ref 70–99)
Glucose-Capillary: 100 mg/dL — ABNORMAL HIGH (ref 70–99)

## 2019-07-13 LAB — CBC
HCT: 24.8 % — ABNORMAL LOW (ref 36.0–46.0)
Hemoglobin: 7.5 g/dL — ABNORMAL LOW (ref 12.0–15.0)
MCH: 22.9 pg — ABNORMAL LOW (ref 26.0–34.0)
MCHC: 30.2 g/dL (ref 30.0–36.0)
MCV: 75.8 fL — ABNORMAL LOW (ref 80.0–100.0)
Platelets: 175 10*3/uL (ref 150–400)
RBC: 3.27 MIL/uL — ABNORMAL LOW (ref 3.87–5.11)
RDW: 20 % — ABNORMAL HIGH (ref 11.5–15.5)
WBC: 8 10*3/uL (ref 4.0–10.5)
nRBC: 0.3 % — ABNORMAL HIGH (ref 0.0–0.2)

## 2019-07-13 LAB — VANCOMYCIN, RANDOM: Vancomycin Rm: 31

## 2019-07-13 MED ORDER — VALPROATE SODIUM 500 MG/5ML IV SOLN
500.0000 mg | Freq: Once | INTRAVENOUS | Status: AC
  Administered 2019-07-13: 500 mg via INTRAVENOUS
  Filled 2019-07-13: qty 5

## 2019-07-13 MED ORDER — DEXTROSE 50 % IV SOLN
INTRAVENOUS | Status: AC
  Administered 2019-07-13: 50 mL
  Filled 2019-07-13: qty 50

## 2019-07-13 NOTE — Progress Notes (Signed)
PROGRESS NOTE        PATIENT DETAILS Name: Janet Mitchell Age: 54 y.o. Sex: female Date of Birth: [birth year] Admit Date: 07/08/2019 Admitting Physician Darliss Cheney, MD XBD:[id number], Myra Rude, MD  Brief Narrative: Patient is a 54 y.o. female with history of ESRD on PD, HTN, DM-2, chronic diarrhea for the past 2 years-sent to [facility] ED for low hemoglobin/hematocrit and evaluation of worsening abdominal pain.  Found to have a hemoglobin of 6.5 and PD catheter associated peritonitis.  See below for further details.   Significant events: 4/12>> admit to [facility] for severe anemia and PD catheter associated peritonitis  Antimicrobial therapy: Vancomycin: 4/13>> Rocephin: 4/13>>4/15  Microbiology data: 4/13>> peritoneal fluid culture: Staph epidermidis  Procedures : None  Consults: Nephrology  DVT Prophylaxis : Prophylactic Heparin   Subjective: No BM for the past 72 hours.  No abdominal pain.   Assessment/Plan: Symptomatic anemia: No indication of blood loss-suspect related to underlying CKD.  Patient is s/p 1 unit of PRBC transfusion-IV iron/Aranesp per nephrology.  PD catheter associated peritonitis: Abdominal pain has improved-exam is benign-peritoneal fluid culture positive for staph epidermidis-continue IV vancomycin.  Diarrhea: Chronic issue-do not think she has C. difficile colitis.  She was in fact "constipated" for 2 weeks prior to admission-following which she had around 4-5 bowel movements (baseline) on the day of admission.  She has had no BM in the past 3 days.  CT abdomen on 4/13 without any colitis.  Suspect C. difficile's are more indicative of colonization rather than infection.  No longer on oral vancomycin.  Per patient-she has seen GI MD in [place] for chronic diarrhea-and at one point was placed on cholestyramine that she stopped taking.  ESRD on PD: Nephrology following-concerned that she is just not getting enough volume removed with  PD-nephrology contemplating switching over to HD-however patient is resistant to being transitioned to HD.  Will await further recommendations from nephrology.  Anasarca: Nephrology following-volume removal with PD-see above regarding concern for ultrafiltration-with tentative plans to transition to hemodialysis patient is agreeable.  Headache: Continues to have headache since yesterday-has photophobia-phonophobia-this headache is very reminiscent of her prior migraine headaches which she has not had in years.  Spoke with Dr. Murlean Iba NSAIDs for now-we will go ahead and try 1 dose of IV Depakene.  Follow.  HTN: Controlled-continue labetalol, losartan  Insulin-dependent DM-2: CBG stable-continue Lantus 20 units daily and SSI.  Follow and optimize  Recent Labs    07/12/19 2151 07/13/19 0819 07/13/19 1232  GLUCAP 139* 112* 91   GERD: Continue PPI  Obesity: Estimated body mass index is 42.87 kg/m as calculated from the following:   Height as of 05/01/19: 5\' 8"  (1.727 m).   Weight as of this encounter: 127.9 kg.    Diet: Diet Order            Diet renal/carb modified with fluid restriction Diet-HS Snack? Nothing; Fluid restriction: 1200 mL Fluid; Room service appropriate? Yes; Fluid consistency: Thin  Diet effective now              Code Status: Full code   Family Communication: Spouse over the phone on 4/17  Disposition Plan: Likely home with home health services on discharge  Barriers to Discharge: PD catheter associated peritonitis requiring IV antibiotics, severe anemia, anasarca-may require switch to hemodialysis.  Antimicrobial agents: Anti-infectives (From admission, onward)   Start  Dose/Rate Route Frequency Ordered Stop   07/12/19 0845  vancomycin (VANCOREADY) IVPB 2000 mg/400 mL     2,000 mg 200 mL/hr over 120 Minutes Intravenous  Once 07/12/19 0837 07/12/19 1243   07/11/19 1456  vancomycin variable dose per unstable renal function (pharmacist dosing)       Does not apply See admin instructions 07/11/19 1456     07/09/19 1800  vancomycin (VANCOCIN) 50 mg/mL oral solution 125 mg  Status:  Discontinued     125 mg Oral 4 times daily 07/09/19 1614 07/10/19 1546   07/09/19 1700  cefTRIAXone (ROCEPHIN) 2 g in sodium chloride 0.9 % 100 mL IVPB  Status:  Discontinued     2 g 200 mL/hr over 30 Minutes Intravenous Every 24 hours 07/09/19 1614 07/11/19 1451   07/09/19 1400  cefTAZidime (FORTAZ) 2 g in sodium chloride 0.9 % 100 mL IVPB     2 g 200 mL/hr over 30 Minutes Intravenous  Once 07/09/19 1354 07/09/19 1900   07/09/19 1345  vancomycin (VANCOREADY) IVPB 2000 mg/400 mL    Note to Pharmacy: For peritonitis in PD patient   2,000 mg 200 mL/hr over 120 Minutes Intravenous  Once 07/09/19 1336 07/09/19 1851   07/09/19 1345  cefTAZidime (FORTAZ) 2 g in sodium chloride 0.9 % 100 mL IVPB  Status:  Discontinued    Note to Pharmacy: For peritonitis in PD patient   2 g 200 mL/hr over 30 Minutes Intravenous  Once 07/09/19 1336 07/09/19 1354       Time spent: 25 minutes-Greater than 50% of this time was spent in counseling, explanation of diagnosis, planning of further management, and coordination of care.  MEDICATIONS: Scheduled Meds: . aspirin EC  81 mg Oral Daily  . calcitRIOL  0.5 mcg Oral Q MTWThF   And  . calcitRIOL  1 mcg Oral Once per day on Sun Sat  . calcium acetate  1,334 mg Oral TID WC  . gentamicin cream  1 application Topical Daily  . heparin injection (subcutaneous)  5,000 Units Subcutaneous Q8H  . insulin aspart  0-9 Units Subcutaneous TID AC & HS  . insulin glargine  20 Units Subcutaneous Daily  . labetalol  300 mg Oral BID  . losartan  100 mg Oral Daily  . pantoprazole  40 mg Oral Daily  . vancomycin variable dose per unstable renal function (pharmacist dosing)   Does not apply See admin instructions   Continuous Infusions: . dialysis solution 2.5% low-MG/low-CA    . dialysis solution 4.25% low-MG/low-CA    . valproate  sodium     PRN Meds:.acetaminophen, dianeal solution for CAPD/CCPD with heparin, dianeal solution for CAPD/CCPD with heparin, hydrALAZINE, ondansetron **OR** ondansetron (ZOFRAN) IV, oxyCODONE-acetaminophen, polyethylene glycol   PHYSICAL EXAM: Vital signs: Vitals:   07/13/19 0500 07/13/19 0502 07/13/19 0636 07/13/19 1236  BP:  (!) 151/69 (!) 178/72 (!) 155/70  Pulse:  70 72 68  Resp:  18 17 15   Temp:  97.8 F (36.6 C) 98 F (36.7 C) 97.8 F (36.6 C)  TempSrc:  Oral Oral Oral  SpO2:  100% 100% 100%  Weight: 127 kg  127.9 kg    Filed Weights   07/12/19 1711 07/13/19 0500 07/13/19 0636  Weight: 127 kg 127 kg 127.9 kg   Body mass index is 42.87 kg/m.   Gen Exam:Alert awake-not in any distress HEENT:atraumatic, normocephalic Chest: B/L clear to auscultation anteriorly CVS:S1S2 regular Abdomen:soft non tender, non distended Extremities:no edema Neurology: Non focal Skin: no rash  I have personally reviewed following labs and imaging studies  LABORATORY DATA: CBC: Recent Labs  Lab 07/09/19 1320 07/10/19 0419 07/11/19 0358 07/12/19 0345 07/13/19 0902  WBC 7.0 7.6 6.7 7.7 8.0  NEUTROABS  --  5.1  --   --   --   HGB 7.0* 7.2* 7.2* 7.3* 7.5*  HCT 23.4* 23.9* 24.3* 24.9* 24.8*  MCV 76.5* 74.9* 76.4* 75.9* 75.8*  PLT 232 200 185 187 751    Basic Metabolic Panel: Recent Labs  Lab 07/08/19 1959 07/10/19 0419 07/11/19 0358 07/12/19 0345 07/13/19 0902  NA 136 132* 133* 133* 134*  K 3.7 3.8 4.0 4.0 3.7  CL 92* 91* 91* 91* 92*  CO2 27 21* 22 22 21*  GLUCOSE 180* 154* 192* 174* 114*  BUN 67* 70* 69* 69* 67*  CREATININE 11.66* 11.55* 11.60* 12.07* 12.23*  CALCIUM 8.5* 8.4* 8.9 9.1 9.1  MG  --  1.6*  --   --   --   PHOS  --   --  8.4* 8.7* 7.8*    GFR: Estimated Creatinine Clearance: 7.7 mL/min (A) (by C-G formula based on SCr of 12.23 mg/dL (H)).  Liver Function Tests: Recent Labs  Lab 07/08/19 1959 07/10/19 0419 07/11/19 0358 07/12/19 0345  07/13/19 0902  AST 11* 11*  --   --   --   ALT 13 10  --   --   --   ALKPHOS 57 47  --   --   --   BILITOT 0.8 0.8  --   --   --   PROT 7.5 7.0  --   --   --   ALBUMIN 2.3* 2.1* 2.1* 2.2* 2.3*   No results for input(s): LIPASE, AMYLASE in the last 168 hours. No results for input(s): AMMONIA in the last 168 hours.  Coagulation Profile: No results for input(s): INR, PROTIME in the last 168 hours.  Cardiac Enzymes: No results for input(s): CKTOTAL, CKMB, CKMBINDEX, TROPONINI in the last 168 hours.  BNP (last 3 results) No results for input(s): PROBNP in the last 8760 hours.  Lipid Profile: No results for input(s): CHOL, HDL, LDLCALC, TRIG, CHOLHDL, LDLDIRECT in the last 72 hours.  Thyroid Function Tests: No results for input(s): TSH, T4TOTAL, FREET4, T3FREE, THYROIDAB in the last 72 hours.  Anemia Panel: No results for input(s): VITAMINB12, FOLATE, FERRITIN, TIBC, IRON, RETICCTPCT in the last 72 hours.  Urine analysis:    Component Value Date/Time   COLORURINE AMBER (A) 07/31/2018 1800   APPEARANCEUR TURBID (A) 07/31/2018 1800   LABSPEC >1.046 (H) 07/31/2018 1800   PHURINE 5.0 07/31/2018 1800   GLUCOSEU 150 (A) 07/31/2018 1800   HGBUR SMALL (A) 07/31/2018 1800   BILIRUBINUR NEGATIVE 07/31/2018 1800   KETONESUR NEGATIVE 07/31/2018 1800   PROTEINUR >=300 (A) 07/31/2018 1800   NITRITE NEGATIVE 07/31/2018 1800   LEUKOCYTESUR MODERATE (A) 07/31/2018 1800    Sepsis Labs: Lactic Acid, Venous No results found for: LATICACIDVEN  MICROBIOLOGY: Recent Results (from the past 240 hour(s))  SARS CORONAVIRUS 2 (TAT 6-24 HRS) Nasopharyngeal Nasopharyngeal Swab     Status: None   Collection Time: 07/08/19 10:04 PM   Specimen: Nasopharyngeal Swab  Result Value Ref Range Status   SARS Coronavirus 2 NEGATIVE NEGATIVE Final    Comment: (NOTE) SARS-CoV-2 target nucleic acids are NOT DETECTED. The SARS-CoV-2 RNA is generally detectable in upper and lower respiratory specimens during  the acute phase of infection. Negative results do not preclude SARS-CoV-2 infection, do not rule out  co-infections with other pathogens, and should not be used as the sole basis for treatment or other patient management decisions. Negative results must be combined with clinical observations, patient history, and epidemiological information. The expected result is Negative. Fact Sheet for Patients: SugarRoll.be Fact Sheet for Healthcare Providers: https://www.woods-mathews.com/ This test is not yet approved or cleared by the [place] FDA and  has been authorized for detection and/or diagnosis of SARS-CoV-2 by FDA under an Emergency Use Authorization (EUA). This EUA will remain  in effect (meaning this test can be used) for the duration of the COVID-19 declaration under Section 56 4(b)(1) of the Act, 21 U.S.C. section 360bbb-3(b)(1), unless the authorization is terminated or revoked sooner. Performed at [facility], [place] [address], [place], [place] [address]   Gram stain     Status: None   Collection Time: 07/09/19  8:55 AM   Specimen: Abdomen; Peritoneal Fluid  Result Value Ref Range Status   Specimen Description ABDOMEN  Final   Special Requests NONE  Final   Gram Stain   Final    WBC PRESENT,BOTH PMN AND MONONUCLEAR GRAM POSITIVE COCCI CYTOSPIN SMEAR Gram Stain Report Called to,Read Back By and Verified WithKym Groom RN 1700 07/09/19 A BROWNING Performed at [facility], [place] [address] [place], [place] [address]    Report Status 07/09/2019 FINAL  Final  Culture, body fluid-bottle     Status: Abnormal   Collection Time: 07/09/19  8:55 AM   Specimen: Peritoneal Washings  Result Value Ref Range Status   Specimen Description PERITONEAL ABDOMEN  Final   Special Requests NONE  Final   Gram Stain   Final    GRAM POSITIVE COCCI IN CLUSTERS IN BOTH AEROBIC AND ANAEROBIC BOTTLES CRITICAL RESULT CALLED TO, READ BACK BY AND  VERIFIED WITH: Manya Silvas [id number] 07/10/2019 Mena Goes Performed at [facility], [place] [address], [place], [place] [address]    Culture STAPHYLOCOCCUS EPIDERMIDIS (A)  Final   Report Status 07/12/2019 FINAL  Final   Organism ID, Bacteria STAPHYLOCOCCUS EPIDERMIDIS  Final      Susceptibility   Staphylococcus epidermidis - MIC*    CIPROFLOXACIN 4 RESISTANT Resistant     ERYTHROMYCIN >=8 RESISTANT Resistant     GENTAMICIN <=0.5 SENSITIVE Sensitive     OXACILLIN >=4 RESISTANT Resistant     TETRACYCLINE 2 SENSITIVE Sensitive     VANCOMYCIN 1 SENSITIVE Sensitive     TRIMETH/SULFA 20 SENSITIVE Sensitive     CLINDAMYCIN >=8 RESISTANT Resistant     RIFAMPIN <=0.5 SENSITIVE Sensitive     Inducible Clindamycin NEGATIVE Sensitive     * STAPHYLOCOCCUS EPIDERMIDIS  C Difficile Quick Screen w PCR reflex     Status: Abnormal   Collection Time: 07/09/19  2:27 PM   Specimen: STOOL  Result Value Ref Range Status   C Diff antigen POSITIVE (A) NEGATIVE Final   C Diff toxin NEGATIVE NEGATIVE Final   C Diff interpretation Results are indeterminate. See PCR results.  Final    Comment: Performed at [facility], [place] [address] [place], [place] [address]  C. Diff by PCR, Reflexed     Status: Abnormal   Collection Time: 07/09/19  2:27 PM  Result Value Ref Range Status   Toxigenic C. Difficile by PCR POSITIVE (A) NEGATIVE Final    Comment: Positive for toxigenic C. difficile with little to no toxin production. Only treat if clinical presentation suggests symptomatic illness. Performed at [facility],  [address], [place], [place] [address]     RADIOLOGY STUDIES/RESULTS: No results found.   LOS: 4 days   Oren Binet, MD  [facility]    To contact the attending provider between 7A-7P or the covering provider during after hours 7P-7A, please log into the web site [email or web address] and access using universal [place] password for that web site. If you do not have the  password, please call the hospital operator.  07/13/2019, 3:25 PM

## 2019-07-13 NOTE — Progress Notes (Addendum)
[facility] Progress Note   Subjective:  Seen in room - finished up with PD this morning, only 552mL UF overnight. Still with headache and nausea - ongoing. Denies CP/dyspnea.  Objective Vitals:   07/12/19 2216 07/13/19 0500 07/13/19 0502 07/13/19 0636  BP: 138/72  (!) 151/69 (!) 178/72  Pulse:   70 72  Resp:   18 17  Temp:   97.8 F (36.6 C) 98 F (36.7 C)  TempSrc:   Oral Oral  SpO2:   100% 100%  Weight:  127 kg  127.9 kg   Physical Exam General: Overweight woman, well appearing, NAD Heart: RRR; no murmur Lungs: CTA anteriorly Abdomen: soft, non-tender Extremities: 2+ LE edema Dialysis Access: PD cath in R abdomen, non-tender  Additional Objective Labs: Basic Metabolic Panel: Recent Labs  Lab 07/11/19 0358 07/12/19 0345 07/13/19 0902  NA 133* 133* 134*  K 4.0 4.0 3.7  CL 91* 91* 92*  CO2 22 22 21*  GLUCOSE 192* 174* 114*  BUN 69* 69* 67*  CREATININE 11.60* 12.07* 12.23*  CALCIUM 8.9 9.1 9.1  PHOS 8.4* 8.7* 7.8*   Liver Function Tests: Recent Labs  Lab 07/08/19 1959 07/08/19 1959 07/10/19 0419 07/10/19 0419 07/11/19 0358 07/12/19 0345 07/13/19 0902  AST 11*  --  11*  --   --   --   --   ALT 13  --  10  --   --   --   --   ALKPHOS 57  --  47  --   --   --   --   BILITOT 0.8  --  0.8  --   --   --   --   PROT 7.5  --  7.0  --   --   --   --   ALBUMIN 2.3*   < > 2.1*   < > 2.1* 2.2* 2.3*   < > = values in this interval not displayed.   CBC: Recent Labs  Lab 07/09/19 1320 07/09/19 1320 07/10/19 0419 07/10/19 0419 07/11/19 0358 07/12/19 0345 07/13/19 0902  WBC 7.0   < > 7.6   < > 6.7 7.7 8.0  NEUTROABS  --   --  5.1  --   --   --   --   HGB 7.0*   < > 7.2*   < > 7.2* 7.3* 7.5*  HCT 23.4*   < > 23.9*   < > 24.3* 24.9* 24.8*  MCV 76.5*  --  74.9*  --  76.4* 75.9* 75.8*  PLT 232   < > 200   < > 185 187 175   < > = values in this interval not displayed.   Blood Culture    Component Value Date/Time   SDES ABDOMEN 07/09/2019  0855   SDES PERITONEAL ABDOMEN 07/09/2019 0855   SPECREQUEST NONE 07/09/2019 0855   SPECREQUEST NONE 07/09/2019 0855   CULT STAPHYLOCOCCUS EPIDERMIDIS (A) 07/09/2019 0855   REPTSTATUS 07/09/2019 FINAL 07/09/2019 0855   REPTSTATUS 07/12/2019 FINAL 07/09/2019 0855   CBG: Recent Labs  Lab 07/11/19 2127 07/12/19 0757 07/12/19 1202 07/12/19 1614 07/12/19 2151  GLUCAP 167* 173* 108* 87 139*   Medications: . dialysis solution 2.5% low-MG/low-CA    . dialysis solution 4.25% low-MG/low-CA     . aspirin EC  81 mg Oral Daily  . calcitRIOL  0.5 mcg Oral Q MTWThF   And  . calcitRIOL  1 mcg Oral Once per day on Sun Sat  . calcium acetate  1,334 mg Oral TID WC  . gentamicin cream  1 application Topical Daily  . heparin injection (subcutaneous)  5,000 Units Subcutaneous Q8H  . insulin aspart  0-9 Units Subcutaneous TID AC & HS  . insulin glargine  20 Units Subcutaneous Daily  . labetalol  300 mg Oral BID  . losartan  100 mg Oral Daily  . pantoprazole  40 mg Oral Daily  . vancomycin variable dose per unstable renal function (pharmacist dosing)   Does not apply See admin instructions    [email or web address] CCPD: 5 cycles overnight, dwell volume of 2000, combo 2.5%, 4.25%, extraneal last fill.  Assessment: 54 year old black female with ESRD on PD - presenting with symptomatic anemia. However, found to have peritonitis and significant volume overload with low albumin.  Plan: 1. Acute on chronicanemia: Hgb 6.5 on admit - s/p 1U PRBCs. Given course IV iron, s/p Aranesp 234mcg [place] on 4/13. Hgb only 7.5 today. 2. Peritonitis: PD Fluid Cx grew MRSE - on Vanc, per pharmacy. No more abdominal pain.  Will recheck cell count and diff after PD tomorrow morning.  3. ESRD on CCPD. Had been told to use 2.5% solution and 4.25 % solution by OP provider but was admitted here before she attempted this. Had rapid cycling CCPDon admissionfor peritonitis. Trialed all  4.25% due to lower than expected UF - developed symptomatic hypotension. Now back to half 2.5%, half 4.25%. Alb remains very low.Discussedchanging from PD to HD until albumin/volume status improves but she continues to decline.  - PD Rx changed to 6 exchanges, dwell time 1.5hr on 4/16 - continue for now - will  discuss further with Dr. Marval Regal.  4. Diarrhea (off and on x years): C Diff antigen positive, toxin negative. Completed PO Vanc. 5. Hypertension: Volume and BP up on admit, then hypotension after change in PD fluid Rx. Today, BP back up although weight stable. Only ~553mL UF recorded. Has LE edema.  6. Metabolic Bone Disease:CorrCa and Phos slightly high. Continue home calcitriol. Consider changing Phoslo to non-Ca binder if rises any further. 7. Nutrition: Alb low - adding pro-stat supplements. 8. DM: per primary  Janet Penton, PA-C 07/13/2019, 11:14 AM  [facility]  I have seen and examined this patient and agree with plan and assessment in the above note with renal recommendations/intervention highlighted.  Unfortunately had less UF last night but only received 1 bag of 4.25% solution.  Will increase dwell volume to 2.5 liters and use 2 bags of 4.25 and 1 bag of 2.5% and see.  If she does not respond she is amenable to VVS consult (or IR) for placement of TDC and transition to intermittent HD. Janet Rooks Shenea Giacobbe,MD 07/13/2019 12:51 PM

## 2019-07-14 LAB — CBC
HCT: 26 % — ABNORMAL LOW (ref 36.0–46.0)
Hemoglobin: 7.8 g/dL — ABNORMAL LOW (ref 12.0–15.0)
MCH: 22.7 pg — ABNORMAL LOW (ref 26.0–34.0)
MCHC: 30 g/dL (ref 30.0–36.0)
MCV: 75.8 fL — ABNORMAL LOW (ref 80.0–100.0)
Platelets: 185 10*3/uL (ref 150–400)
RBC: 3.43 MIL/uL — ABNORMAL LOW (ref 3.87–5.11)
RDW: 20 % — ABNORMAL HIGH (ref 11.5–15.5)
WBC: 6.1 10*3/uL (ref 4.0–10.5)
nRBC: 0.3 % — ABNORMAL HIGH (ref 0.0–0.2)

## 2019-07-14 LAB — GLUCOSE, CAPILLARY
Glucose-Capillary: 117 mg/dL — ABNORMAL HIGH (ref 70–99)
Glucose-Capillary: 131 mg/dL — ABNORMAL HIGH (ref 70–99)
Glucose-Capillary: 53 mg/dL — ABNORMAL LOW (ref 70–99)
Glucose-Capillary: 70 mg/dL (ref 70–99)
Glucose-Capillary: 79 mg/dL (ref 70–99)

## 2019-07-14 LAB — BODY FLUID CELL COUNT WITH DIFFERENTIAL
Eos, Fluid: 0 %
Lymphs, Fluid: 1 %
Monocyte-Macrophage-Serous Fluid: 12 % — ABNORMAL LOW (ref 50–90)
Neutrophil Count, Fluid: 87 % — ABNORMAL HIGH (ref 0–25)
Total Nucleated Cell Count, Fluid: 26 cu mm (ref 0–1000)

## 2019-07-14 LAB — RENAL FUNCTION PANEL
Albumin: 2.3 g/dL — ABNORMAL LOW (ref 3.5–5.0)
Anion gap: 20 — ABNORMAL HIGH (ref 5–15)
BUN: 65 mg/dL — ABNORMAL HIGH (ref 6–20)
CO2: 22 mmol/L (ref 22–32)
Calcium: 9.1 mg/dL (ref 8.9–10.3)
Chloride: 90 mmol/L — ABNORMAL LOW (ref 98–111)
Creatinine, Ser: 12.6 mg/dL — ABNORMAL HIGH (ref 0.44–1.00)
GFR calc Af Amer: 4 mL/min — ABNORMAL LOW (ref >60)
GFR calc non Af Amer: 3 mL/min — ABNORMAL LOW (ref >60)
Glucose, Bld: 122 mg/dL — ABNORMAL HIGH (ref 70–99)
Phosphorus: 7.8 mg/dL — ABNORMAL HIGH (ref 2.5–4.6)
Potassium: 3.6 mmol/L (ref 3.5–5.1)
Sodium: 132 mmol/L — ABNORMAL LOW (ref 135–145)

## 2019-07-14 MED ORDER — DEXTROSE 50 % IV SOLN
INTRAVENOUS | Status: AC
  Administered 2019-07-14: 25 mL via INTRAVENOUS
  Filled 2019-07-14: qty 50

## 2019-07-14 MED ORDER — INSULIN GLARGINE 100 UNIT/ML ~~LOC~~ SOLN
14.0000 [IU] | Freq: Every day | SUBCUTANEOUS | Status: DC
  Administered 2019-07-15 – 2019-07-18 (×4): 14 [IU] via SUBCUTANEOUS
  Filled 2019-07-14 (×4): qty 0.14

## 2019-07-14 MED ORDER — DEXTROSE 50 % IV SOLN: 25.0000 mL | Freq: Once | INTRAVENOUS | Status: AC

## 2019-07-14 NOTE — Progress Notes (Addendum)
[facility] Progress Note   Subjective:  Seen in room - CCPD still going for another 2hr (at time of my visit) d/t changes made yesterday. Still with headache, otherwise feels ok. Labs today without much change in BUN/Cr - will await final UF calculation.  Objective Vitals:   07/13/19 1935 07/13/19 2121 07/13/19 2136 07/14/19 0540  BP: (!) 163/82 (!) 174/77 (!) 170/96 (!) 159/86  Pulse: 64  64 66  Resp:   18 18  Temp:   97.8 F (36.6 C) 97.8 F (36.6 C)  TempSrc:      SpO2:   99% 99%  Weight: 127.8 kg   129.5 kg   Physical Exam General: Overweight woman, well appearing, NAD Heart: RRR; no murmur Lungs: CTA anteriorly Abdomen: soft, non-tender Extremities: 2+ LE edema Dialysis Access: PD cath in R abdomen, non-tender  Additional Objective Labs: Basic Metabolic Panel: Recent Labs  Lab 07/12/19 0345 07/13/19 0902 07/14/19 0553  NA 133* 134* 132*  K 4.0 3.7 3.6  CL 91* 92* 90*  CO2 22 21* 22  GLUCOSE 174* 114* 122*  BUN 69* 67* 65*  CREATININE 12.07* 12.23* 12.60*  CALCIUM 9.1 9.1 9.1  PHOS 8.7* 7.8* 7.8*   Liver Function Tests: Recent Labs  Lab 07/08/19 1959 07/08/19 1959 07/10/19 0419 07/11/19 0358 07/12/19 0345 07/13/19 0902 07/14/19 0553  AST 11*  --  11*  --   --   --   --   ALT 13  --  10  --   --   --   --   ALKPHOS 57  --  47  --   --   --   --   BILITOT 0.8  --  0.8  --   --   --   --   PROT 7.5  --  7.0  --   --   --   --   ALBUMIN 2.3*   < > 2.1*   < > 2.2* 2.3* 2.3*   < > = values in this interval not displayed.   CBC: Recent Labs  Lab 07/10/19 0419 07/10/19 0419 07/11/19 0358 07/11/19 0358 07/12/19 0345 07/13/19 0902 07/14/19 0553  WBC 7.6   < > 6.7   < > 7.7 8.0 6.1  NEUTROABS 5.1  --   --   --   --   --   --   HGB 7.2*   < > 7.2*   < > 7.3* 7.5* 7.8*  HCT 23.9*   < > 24.3*   < > 24.9* 24.8* 26.0*  MCV 74.9*  --  76.4*  --  75.9* 75.8* 75.8*  PLT 200   < > 185   < > 187 175 185   < > = values in this interval not  displayed.   Blood Culture    Component Value Date/Time   SDES ABDOMEN 07/09/2019 0855   SDES PERITONEAL ABDOMEN 07/09/2019 0855   SPECREQUEST NONE 07/09/2019 0855   SPECREQUEST NONE 07/09/2019 0855   CULT STAPHYLOCOCCUS EPIDERMIDIS (A) 07/09/2019 0855   REPTSTATUS 07/09/2019 FINAL 07/09/2019 0855   REPTSTATUS 07/12/2019 FINAL 07/09/2019 0855   Medications: . dialysis solution 2.5% low-MG/low-CA    . dialysis solution 4.25% low-MG/low-CA     . aspirin EC  81 mg Oral Daily  . calcitRIOL  0.5 mcg Oral Q MTWThF   And  . calcitRIOL  1 mcg Oral Once per day on Sun Sat  . calcium acetate  1,334 mg Oral TID WC  .  gentamicin cream  1 application Topical Daily  . heparin injection (subcutaneous)  5,000 Units Subcutaneous Q8H  . insulin aspart  0-9 Units Subcutaneous TID AC & HS  . insulin glargine  20 Units Subcutaneous Daily  . labetalol  300 mg Oral BID  . losartan  100 mg Oral Daily  . pantoprazole  40 mg Oral Daily  . vancomycin variable dose per unstable renal function (pharmacist dosing)   Does not apply See admin instructions    [email or web address] CCPD: 5 cycles overnight, dwell volume of 2000, combo 2.5%, 4.25%, extraneal last fill.  Assessment: 54 year old black female with ESRD on PD - presenting with symptomatic anemia. However, found to have peritonitis and significant volume overload with low albumin.  Plan: 1. Acute on chronicanemia: Hgb 6.5 on admit - s/p 1U PRBCs. Given course IV iron, s/p ranesp 267mcg [place] on 4/13. Hgb 7.8 today - better. 2. Peritonitis: PD Fluid Cx grew MRSE - on Vanc, per pharmacy. No more abdominal pain, seems to be resolving.  Repeating cell count and diff after PD this morning. 3. ESRD on CCPD. Had been told to use 2.5% solution and 4.25 % solution by OP provider but was admitted here before she attempted this. Had rapid cycling CCPDon admissionfor peritonitis. Trialed all 4.25% due to lower than  expected UF - developed symptomatic hypotension. Now back to half 2.5%, half 4.25%. Alb remains very low.Discussedchanging from PD to HD until albumin/volume status improves butshe continues to decline.            - PD Rx changed to 6 exchanges, 2.5L, dwell time 2hr on 4/17 using 2/3 4.25%, 1/3   2.5% bags. Heparin also added 4/17 d/t fibrin in PD effluent. Awaiting UF  calculations to see if needs further changes tonight. Had previously discussed  may need to change to HD. 4. Diarrhea (off and on x years): C Diff antigen positive, toxin negative. Completed PO Vanc. 5. Hypertension: Volume and BP up on admit, then hypotension after change in PD fluid Rx. BP high again with lower UF than expected. Awaiting UF total for today. 6. Metabolic Bone Disease:CorrCa and Phos slightly high. Continue home calcitriol. Consider changing Phoslo to non-Ca binder if rises any further. 7. Nutrition: Alb low - adding pro-stat supplements. 8. DM: per primary 9. Dispo: Having poor clearance/UF with PD but these discussions can be ongoing with outpatient nephrologist if she is clear for discharge from other medical problems.    Veneta Penton, PA-C 07/14/2019, 11:36 AM  [facility]  I have seen and examined this patient and agree with plan and assessment in the above note with renal recommendations/intervention highlighted.  Will need to see how she responded to new prescription.  Will continue again tonight but if no significant improvement I recommend transition to IHD.  Still unclear what is causing her headaches and nausea.  Her SBP has significantly improved.   Governor Rooks Masahiro Iglesia,MD 07/14/2019 12:13 PM

## 2019-07-14 NOTE — Progress Notes (Signed)
Patient with CBG of 53. IV dextrose given per protocol. Will notify Dr. Sloan Leiter. Patient is awake but states she feels sleepy. Will recheck CBG in 15 min. Soyla Dryer RN

## 2019-07-14 NOTE — Progress Notes (Signed)
PROGRESS NOTE        PATIENT DETAILS Name: Terrace Chiem Pinnix Age: 54 y.o. Sex: female Date of Birth: [birth year] Admit Date: 07/08/2019 Admitting Physician Darliss Cheney, MD [id number], Myra Rude, MD  Brief Narrative: Patient is a 54 y.o. female with history of ESRD on PD, HTN, DM-2, chronic diarrhea for the past 2 years-sent to [facility] ED for low hemoglobin/hematocrit and evaluation of worsening abdominal pain.  Found to have a hemoglobin of 6.5 and PD catheter associated peritonitis.  See below for further details.   Significant events: 4/12>> admit to [facility] for severe anemia and PD catheter associated peritonitis  Antimicrobial therapy: Vancomycin: 4/13>> Rocephin: 4/13>>4/15  Microbiology data: 4/13>> peritoneal fluid culture: Staph epidermidis  Procedures : None  Consults: Nephrology  DVT Prophylaxis : Prophylactic Heparin   Subjective: Had BM-x2 yesterday-soft-not watery.  Claims headache resolved-slept without headache yesterday.  Assessment/Plan: Symptomatic anemia: No indication of blood loss-suspect related to underlying CKD.  Patient is s/p 1 unit of PRBC transfusion-IV iron/Aranesp per nephrology.  Hemoglobin slowly improving-follow periodically.  PD catheter associated peritonitis: Abdominal pain has improved-exam is benign-peritoneal fluid culture positive for staph epidermidis-continue IV vancomycin.  Diarrhea: Chronic issue-do not think she has C. difficile colitis.  She was in fact "constipated" for 2 weeks prior to admission-following which she had around 4-5 bowel movements (baseline) on the day of admission.  No diarrhea-finally had 2 BMs yesterday-after several days without a BM.  CT abdomen on 4/13 without any colitis.  Suspect C. difficile's are more indicative of colonization rather than infection.  No longer on oral vancomycin.  Per patient-she has seen GI MD in [place] for chronic diarrhea-and at one point was placed on cholestyramine  that she stopped taking.  ESRD on PD: Nephrology following-not getting enough fluid pulled off with PD-nephrology recommending transition to HD.  Patient reluctant-discussion in process.  Will await further recommendations from nephrology team.    Anasarca: Nephrology following-volume removal with PD-see above regarding concern for ultrafiltration-with tentative plans to transition to hemodialysis patient is agreeable.  Headache: Suspect migraine headaches-she did not have headaches this morning when I saw her-in fact came back she claims to have slept last night without any headaches.  She was treated with IV Depakene yesterday.    HTN: Controlled-continue labetalol, losartan  Insulin-dependent DM-2: CBG stable-continue Lantus 20 units daily and SSI.  Follow and optimize  Recent Labs    07/13/19 2133 07/14/19 0750 07/14/19 1142  GLUCAP 100* 117* 131*   GERD: Continue PPI  Obesity: Estimated body mass index is 43.39 kg/m as calculated from the following:   Height as of 05/01/19: 5\' 8"  (1.727 m).   Weight as of this encounter: 129.5 kg.    Diet: Diet Order            Diet renal/carb modified with fluid restriction Diet-HS Snack? Nothing; Fluid restriction: 1200 mL Fluid; Room service appropriate? Yes; Fluid consistency: Thin  Diet effective now              Code Status: Full code   Family Communication: Left voicemail for spouse on 4/18  Disposition Plan: Likely home with home health services on discharge  Barriers to Discharge: PD catheter associated peritonitis requiring IV antibiotics, severe anemia, anasarca-may require switch to hemodialysis.  Antimicrobial agents: Anti-infectives (From admission, onward)   Start     Dose/Rate Route Frequency  Ordered Stop   07/12/19 0845  vancomycin (VANCOREADY) IVPB 2000 mg/400 mL     2,000 mg 200 mL/hr over 120 Minutes Intravenous  Once 07/12/19 0837 07/12/19 1243   07/11/19 1456  vancomycin variable dose per unstable renal  function (pharmacist dosing)      Does not apply See admin instructions 07/11/19 1456     07/09/19 1800  vancomycin (VANCOCIN) 50 mg/mL oral solution 125 mg  Status:  Discontinued     125 mg Oral 4 times daily 07/09/19 1614 07/10/19 1546   07/09/19 1700  cefTRIAXone (ROCEPHIN) 2 g in sodium chloride 0.9 % 100 mL IVPB  Status:  Discontinued     2 g 200 mL/hr over 30 Minutes Intravenous Every 24 hours 07/09/19 1614 07/11/19 1451   07/09/19 1400  cefTAZidime (FORTAZ) 2 g in sodium chloride 0.9 % 100 mL IVPB     2 g 200 mL/hr over 30 Minutes Intravenous  Once 07/09/19 1354 07/09/19 1900   07/09/19 1345  vancomycin (VANCOREADY) IVPB 2000 mg/400 mL    Note to Pharmacy: For peritonitis in PD patient   2,000 mg 200 mL/hr over 120 Minutes Intravenous  Once 07/09/19 1336 07/09/19 1851   07/09/19 1345  cefTAZidime (FORTAZ) 2 g in sodium chloride 0.9 % 100 mL IVPB  Status:  Discontinued    Note to Pharmacy: For peritonitis in PD patient   2 g 200 mL/hr over 30 Minutes Intravenous  Once 07/09/19 1336 07/09/19 1354       Time spent: 25 minutes-Greater than 50% of this time was spent in counseling, explanation of diagnosis, planning of further management, and coordination of care.  MEDICATIONS: Scheduled Meds: . aspirin EC  81 mg Oral Daily  . calcitRIOL  0.5 mcg Oral Q MTWThF   And  . calcitRIOL  1 mcg Oral Once per day on Sun Sat  . calcium acetate  1,334 mg Oral TID WC  . gentamicin cream  1 application Topical Daily  . heparin injection (subcutaneous)  5,000 Units Subcutaneous Q8H  . insulin aspart  0-9 Units Subcutaneous TID AC & HS  . insulin glargine  20 Units Subcutaneous Daily  . labetalol  300 mg Oral BID  . losartan  100 mg Oral Daily  . pantoprazole  40 mg Oral Daily  . vancomycin variable dose per unstable renal function (pharmacist dosing)   Does not apply See admin instructions   Continuous Infusions: . dialysis solution 2.5% low-MG/low-CA    . dialysis solution 4.25%  low-MG/low-CA     PRN Meds:.acetaminophen, dianeal solution for CAPD/CCPD with heparin, dianeal solution for CAPD/CCPD with heparin, hydrALAZINE, ondansetron **OR** ondansetron (ZOFRAN) IV, oxyCODONE-acetaminophen, polyethylene glycol   PHYSICAL EXAM: Vital signs: Vitals:   07/13/19 2121 07/13/19 2136 07/14/19 0540 07/14/19 1236  BP: (!) 174/77 (!) 170/96 (!) 159/86 (!) 173/69  Pulse:  64 66 68  Resp:  18 18 15   Temp:  97.8 F (36.6 C) 97.8 F (36.6 C) 98.4 F (36.9 C)  TempSrc:    Oral  SpO2:  99% 99% 98%  Weight:   129.5 kg    Filed Weights   07/13/19 0636 07/13/19 1935 07/14/19 0540  Weight: 127.9 kg 127.8 kg 129.5 kg   Body mass index is 43.39 kg/m.   Gen Exam:Alert awake-not in any distress HEENT:atraumatic, normocephalic Chest: B/L clear to auscultation anteriorly CVS:S1S2 regular Abdomen:soft non tender, non distended Extremities:++ edema Neurology: Non focal Skin: no rash  I have personally reviewed following labs and imaging studies  LABORATORY DATA: CBC: Recent Labs  Lab 07/10/19 0419 07/11/19 0358 07/12/19 0345 07/13/19 0902 07/14/19 0553  WBC 7.6 6.7 7.7 8.0 6.1  NEUTROABS 5.1  --   --   --   --   HGB 7.2* 7.2* 7.3* 7.5* 7.8*  HCT 23.9* 24.3* 24.9* 24.8* 26.0*  MCV 74.9* 76.4* 75.9* 75.8* 75.8*  PLT 200 185 187 175 397    Basic Metabolic Panel: Recent Labs  Lab 07/10/19 0419 07/11/19 0358 07/12/19 0345 07/13/19 0902 07/14/19 0553  NA 132* 133* 133* 134* 132*  K 3.8 4.0 4.0 3.7 3.6  CL 91* 91* 91* 92* 90*  CO2 21* 22 22 21* 22  GLUCOSE 154* 192* 174* 114* 122*  BUN 70* 69* 69* 67* 65*  CREATININE 11.55* 11.60* 12.07* 12.23* 12.60*  CALCIUM 8.4* 8.9 9.1 9.1 9.1  MG 1.6*  --   --   --   --   PHOS  --  8.4* 8.7* 7.8* 7.8*    GFR: Estimated Creatinine Clearance: 7.5 mL/min (A) (by C-G formula based on SCr of 12.6 mg/dL (H)).  Liver Function Tests: Recent Labs  Lab 07/08/19 1959 07/08/19 1959 07/10/19 0419 07/11/19 0358  07/12/19 0345 07/13/19 0902 07/14/19 0553  AST 11*  --  11*  --   --   --   --   ALT 13  --  10  --   --   --   --   ALKPHOS 57  --  47  --   --   --   --   BILITOT 0.8  --  0.8  --   --   --   --   PROT 7.5  --  7.0  --   --   --   --   ALBUMIN 2.3*   < > 2.1* 2.1* 2.2* 2.3* 2.3*   < > = values in this interval not displayed.   No results for input(s): LIPASE, AMYLASE in the last 168 hours. No results for input(s): AMMONIA in the last 168 hours.  Coagulation Profile: No results for input(s): INR, PROTIME in the last 168 hours.  Cardiac Enzymes: No results for input(s): CKTOTAL, CKMB, CKMBINDEX, TROPONINI in the last 168 hours.  BNP (last 3 results) No results for input(s): PROBNP in the last 8760 hours.  Lipid Profile: No results for input(s): CHOL, HDL, LDLCALC, TRIG, CHOLHDL, LDLDIRECT in the last 72 hours.  Thyroid Function Tests: No results for input(s): TSH, T4TOTAL, FREET4, T3FREE, THYROIDAB in the last 72 hours.  Anemia Panel: No results for input(s): VITAMINB12, FOLATE, FERRITIN, TIBC, IRON, RETICCTPCT in the last 72 hours.  Urine analysis:    Component Value Date/Time   COLORURINE AMBER (A) 07/31/2018 1800   APPEARANCEUR TURBID (A) 07/31/2018 1800   LABSPEC >1.046 (H) 07/31/2018 1800   PHURINE 5.0 07/31/2018 1800   GLUCOSEU 150 (A) 07/31/2018 1800   HGBUR SMALL (A) 07/31/2018 1800   BILIRUBINUR NEGATIVE 07/31/2018 1800   KETONESUR NEGATIVE 07/31/2018 1800   PROTEINUR >=300 (A) 07/31/2018 1800   NITRITE NEGATIVE 07/31/2018 1800   LEUKOCYTESUR MODERATE (A) 07/31/2018 1800    Sepsis Labs: Lactic Acid, Venous No results found for: LATICACIDVEN  MICROBIOLOGY: Recent Results (from the past 240 hour(s))  SARS CORONAVIRUS 2 (TAT 6-24 HRS) Nasopharyngeal Nasopharyngeal Swab     Status: None   Collection Time: 07/08/19 10:04 PM   Specimen: Nasopharyngeal Swab  Result Value Ref Range Status   SARS Coronavirus 2 NEGATIVE NEGATIVE Final    Comment:  (  NOTE) SARS-CoV-2 target nucleic acids are NOT DETECTED. The SARS-CoV-2 RNA is generally detectable in upper and lower respiratory specimens during the acute phase of infection. Negative results do not preclude SARS-CoV-2 infection, do not rule out co-infections with other pathogens, and should not be used as the sole basis for treatment or other patient management decisions. Negative results must be combined with clinical observations, patient history, and epidemiological information. The expected result is Negative. Fact Sheet for Patients: SugarRoll.be Fact Sheet for Healthcare Providers: https://www.woods-mathews.com/ This test is not yet approved or cleared by the [place] FDA and  has been authorized for detection and/or diagnosis of SARS-CoV-2 by FDA under an Emergency Use Authorization (EUA). This EUA will remain  in effect (meaning this test can be used) for the duration of the COVID-19 declaration under Section 56 4(b)(1) of the Act, 21 U.S.C. section 360bbb-3(b)(1), unless the authorization is terminated or revoked sooner. Performed at [facility], [place] [address] [place], [place] [address]   Gram stain     Status: None   Collection Time: 07/09/19  8:55 AM   Specimen: Abdomen; Peritoneal Fluid  Result Value Ref Range Status   Specimen Description ABDOMEN  Final   Special Requests NONE  Final   Gram Stain   Final    WBC PRESENT,BOTH PMN AND MONONUCLEAR GRAM POSITIVE COCCI CYTOSPIN SMEAR Gram Stain Report Called to,Read Back By and Verified WithKym Groom RN 1700 07/09/19 A BROWNING Performed at [facility], [place] [address], [place], [place] [address]    Report Status 07/09/2019 FINAL  Final  Culture, body fluid-bottle     Status: Abnormal   Collection Time: 07/09/19  8:55 AM   Specimen: Peritoneal Washings  Result Value Ref Range Status   Specimen Description PERITONEAL ABDOMEN  Final   Special Requests NONE   Final   Gram Stain   Final    GRAM POSITIVE COCCI IN CLUSTERS IN BOTH AEROBIC AND ANAEROBIC BOTTLES CRITICAL RESULT CALLED TO, READ BACK BY AND VERIFIED WITH: Manya Silvas [id number] 07/10/2019 Mena Goes Performed at [facility], [place] [address], [place], [place] [address]    Culture STAPHYLOCOCCUS EPIDERMIDIS (A)  Final   Report Status 07/12/2019 FINAL  Final   Organism ID, Bacteria STAPHYLOCOCCUS EPIDERMIDIS  Final      Susceptibility   Staphylococcus epidermidis - MIC*    CIPROFLOXACIN 4 RESISTANT Resistant     ERYTHROMYCIN >=8 RESISTANT Resistant     GENTAMICIN <=0.5 SENSITIVE Sensitive     OXACILLIN >=4 RESISTANT Resistant     TETRACYCLINE 2 SENSITIVE Sensitive     VANCOMYCIN 1 SENSITIVE Sensitive     TRIMETH/SULFA 20 SENSITIVE Sensitive     CLINDAMYCIN >=8 RESISTANT Resistant     RIFAMPIN <=0.5 SENSITIVE Sensitive     Inducible Clindamycin NEGATIVE Sensitive     * STAPHYLOCOCCUS EPIDERMIDIS  C Difficile Quick Screen w PCR reflex     Status: Abnormal   Collection Time: 07/09/19  2:27 PM   Specimen: STOOL  Result Value Ref Range Status   C Diff antigen POSITIVE (A) NEGATIVE Final   C Diff toxin NEGATIVE NEGATIVE Final   C Diff interpretation Results are indeterminate. See PCR results.  Final    Comment: Performed at [facility], [place] [address], [place],  [address]  C. Diff by PCR, Reflexed     Status: Abnormal   Collection Time: 07/09/19  2:27 PM  Result Value Ref Range Status   Toxigenic  C. Difficile by PCR POSITIVE (A) NEGATIVE Final    Comment: Positive for toxigenic C. difficile with little to no toxin production. Only treat if clinical presentation suggests symptomatic illness. Performed at [facility], [place] [address], [place], [place] [address]     RADIOLOGY STUDIES/RESULTS: No results found.   LOS: 5 days   Oren Binet, MD  [facility]    To contact the attending provider between 7A-7P or the covering provider during after  hours 7P-7A, please log into the web site [email or web address] and access using universal [place] password for that web site. If you do not have the password, please call the hospital operator.  07/14/2019, 1:39 PM

## 2019-07-15 LAB — CBC
HCT: 24 % — ABNORMAL LOW (ref 36.0–46.0)
Hemoglobin: 7.4 g/dL — ABNORMAL LOW (ref 12.0–15.0)
MCH: 23.3 pg — ABNORMAL LOW (ref 26.0–34.0)
MCHC: 30.8 g/dL (ref 30.0–36.0)
MCV: 75.5 fL — ABNORMAL LOW (ref 80.0–100.0)
Platelets: 190 10*3/uL (ref 150–400)
RBC: 3.18 MIL/uL — ABNORMAL LOW (ref 3.87–5.11)
RDW: 20.2 % — ABNORMAL HIGH (ref 11.5–15.5)
WBC: 6.7 10*3/uL (ref 4.0–10.5)
nRBC: 0.3 % — ABNORMAL HIGH (ref 0.0–0.2)

## 2019-07-15 LAB — RENAL FUNCTION PANEL
Albumin: 2.1 g/dL — ABNORMAL LOW (ref 3.5–5.0)
Anion gap: 22 — ABNORMAL HIGH (ref 5–15)
BUN: 63 mg/dL — ABNORMAL HIGH (ref 6–20)
CO2: 22 mmol/L (ref 22–32)
Calcium: 9.1 mg/dL (ref 8.9–10.3)
Chloride: 89 mmol/L — ABNORMAL LOW (ref 98–111)
Creatinine, Ser: 12.57 mg/dL — ABNORMAL HIGH (ref 0.44–1.00)
GFR calc Af Amer: 4 mL/min — ABNORMAL LOW (ref >60)
GFR calc non Af Amer: 3 mL/min — ABNORMAL LOW (ref >60)
Glucose, Bld: 113 mg/dL — ABNORMAL HIGH (ref 70–99)
Phosphorus: 7.3 mg/dL — ABNORMAL HIGH (ref 2.5–4.6)
Potassium: 3.4 mmol/L — ABNORMAL LOW (ref 3.5–5.1)
Sodium: 133 mmol/L — ABNORMAL LOW (ref 135–145)

## 2019-07-15 LAB — PATHOLOGIST SMEAR REVIEW

## 2019-07-15 LAB — GLUCOSE, CAPILLARY
Glucose-Capillary: 110 mg/dL — ABNORMAL HIGH (ref 70–99)
Glucose-Capillary: 112 mg/dL — ABNORMAL HIGH (ref 70–99)
Glucose-Capillary: 102 mg/dL — ABNORMAL HIGH (ref 70–99)
Glucose-Capillary: 159 mg/dL — ABNORMAL HIGH (ref 70–99)

## 2019-07-15 MED ORDER — INSULIN ASPART 100 UNIT/ML ~~LOC~~ SOLN
0.0000 [IU] | Freq: Three times a day (TID) | SUBCUTANEOUS | Status: DC
  Administered 2019-07-16 – 2019-07-19 (×2): 1 [IU] via SUBCUTANEOUS

## 2019-07-15 NOTE — Progress Notes (Signed)
Patient sitting in a chair called for nurse and complained of feeling dizzy and she said her head is spinning. BP in normal range 140/63; P 65; Sat O2 99 on RA. No other complaints at this time. Patient asked to go back in bed. MD was notified. MD at bedside; patient refused any new medicine.Will continue to monitor.

## 2019-07-15 NOTE — Progress Notes (Signed)
Physical Therapy Attempt    07/15/19 0913  PT Visit Information  Reason Eval/Treat Not Completed Other (comment) (Patient still hooked up to PD, 32 minutes remaining)   Patient agreeable to have physical therapist return at a later time. PT to attempt again today, schedule permitting.   Birdie Hopes, PT, DPT Acute Rehab [phone] office

## 2019-07-15 NOTE — Progress Notes (Signed)
Physical Therapy Treatment Patient Details Name: Janet Mitchell MRN: [id number] DOB: September 30, 1965 Today's Date: 07/15/2019    History of Present Illness 54 year old female with history of ESRD on PD, HTN, DM-2, chronic diarrhea for the past 2 years-sent to [facility] ED for low hemoglobin/hematocrit and evaluation of worsening abdominal pain.  Found to have a hemoglobin of 6.5 and PD catheter associated peritonitis. Nephrology following and recommending HD.    PT Comments    Patient continues to require one person assistance for mobility with use of RW. She is unsteady with ambulation requiring hands on assistance. Patient is at risk for falls. She reports that all of her chairs are raised up at home. With education and encouragement on benefits of OOB, patient sat in chair at end of session. Patient reports she will be in the hospital for another week. Anticipate patient will be able to progress her mobility in order to discharge home with husband, use of RW, and home PT.    Follow Up Recommendations  Home health PT;Supervision/Assistance - 24 hour     Equipment Recommendations  Rolling walker with 5" wheels((patient owns RW))       Precautions / Restrictions Precautions Precautions: Fall Restrictions Weight Bearing Restrictions: No    Mobility  Bed Mobility Overal bed mobility: Modified Independent General bed mobility comments: HOB elevated  Transfers Overall transfer level: Needs assistance Equipment used: Rolling walker (2 wheeled) Transfers: Sit to/from Stand Sit to Stand: Min assist;Mod assist         General transfer comment: minA for transfer from EOB with bed height increased, modA from recliner chair due to lower surface height. Patient reports all her chairs at home are raised up.  Ambulation/Gait Ambulation/Gait assistance: Min guard;Min assist Gait Distance (Feet): 50 Feet(30) Assistive device: Rolling walker (2 wheeled) Gait Pattern/deviations: Decreased  dorsiflexion - right;Decreased dorsiflexion - left;Decreased step length - right;Decreased step length - left;Step-through pattern;Staggering right;Staggering left Gait velocity: decreased   General Gait Details: Near LOB twice requiring minA on turns. Cues for decreasing step length to increase stability on turns. Patient confirms she has a RW at home.    Stairs Stairs: (not safe to attempt yet)     Balance Overall balance assessment: Needs assistance Sitting-balance support: Feet supported Sitting balance-Leahy Scale: Good     Standing balance support: Bilateral upper extremity supported   Standing balance comment: supervision static standing balance with RW    Cognition Arousal/Alertness: Awake/alert Behavior During Therapy: WFL for tasks assessed/performed Overall Cognitive Status: Within Functional Limits for tasks assessed       General Comments General comments (skin integrity, edema, etc.): BP at start of session: 172/92, nurse aware and meds given. Patient reports some dizziness during session.      Pertinent Vitals/Pain Pain Assessment: 0-10 Pain Score: 0-No pain(at rest) Pain Intervention(s): (Patient had headache earlier and received aspirin.)           PT Goals (current goals can now be found in the care plan section) Progress towards PT goals: Progressing toward goals    Frequency    Min 3X/week      PT Plan Current plan remains appropriate       AM-PAC PT "6 Clicks" Mobility   Outcome Measure  Help needed turning from your back to your side while in a flat bed without using bedrails?: None Help needed moving from lying on your back to sitting on the side of a flat bed without using bedrails?: None Help needed moving  to and from a bed to a chair (including a wheelchair)?: A Little Help needed standing up from a chair using your arms (e.g., wheelchair or bedside chair)?: A Lot Help needed to walk in hospital room?: A Little Help needed  climbing 3-5 steps with a railing? : A Lot 6 Click Score: 18    End of Session   Activity Tolerance: Patient tolerated treatment well Patient left: in chair;with call bell/phone within reach Nurse Communication: Mobility status PT Visit Diagnosis: Unsteadiness on feet (R26.81);History of falling (Z91.81);Muscle weakness (generalized) (M62.81);Difficulty in walking, not elsewhere classified (R26.2)     Time: 2426-8341 PT Time Calculation (min) (ACUTE ONLY): 24 min  Charges:  $Gait Training: 23-37 mins                    Birdie Hopes, PT, DPT Acute Rehab [phone] office     Birdie Hopes 07/15/2019, 3:33 PM

## 2019-07-15 NOTE — Progress Notes (Signed)
PROGRESS NOTE        PATIENT DETAILS Name: Janet Mitchell Age: 54 y.o. Sex: female Date of Birth: 06-29-1965 Admit Date: 07/08/2019 Admitting Physician Darliss Cheney, MD WUX:LKGMW, Myra Rude, MD  Brief Narrative: Patient is a 54 y.o. female with history of ESRD on PD, HTN, DM-2, chronic diarrhea for the past 2 years-sent to [facility] ED for low hemoglobin/hematocrit and evaluation of worsening abdominal pain.  Found to have a hemoglobin of 6.5 and PD catheter associated peritonitis.  See below for further details.   Significant events: 4/12>> admit to [facility] for severe anemia and PD catheter associated peritonitis  Antimicrobial therapy: Vancomycin: 4/13>> Rocephin: 4/13>>4/15  Microbiology data: 4/13>> peritoneal fluid culture: Staph epidermidis  Procedures : None  Consults: Nephrology  DVT Prophylaxis : Prophylactic Heparin   Subjective: Continues to have intermittent headaches-no headache this morning when I saw her.  Still with significant leg swelling/anasarca.  No diarrhea-she did not have a bowel movement yesterday.  Assessment/Plan: Symptomatic anemia: No indication of blood loss-suspect related to underlying CKD.  Patient is s/p 1 unit of PRBC transfusion-IV iron/Aranesp per nephrology.  Hemoglobin slowly improving-follow periodically.  PD catheter associated peritonitis: Abdominal pain has improved-exam is benign-peritoneal fluid culture positive for staph epidermidis-continue IV vancomycin.  Diarrhea: Chronic issue-do not think she has C. difficile colitis.  She was in fact "constipated" for 2 weeks prior to admission-following which she had around 4-5 bowel movements (baseline) on the day of admission.  There has been no diarrhea since since then.  Suspect C. Difficile studies are are more indicative of colonization rather than infection.  No longer on oral vancomycin.  Per patient-she has seen GI MD in [place] for chronic diarrhea-and at one  point was placed on cholestyramine that she stopped taking.  ESRD on PD: Nephrology following-not getting enough fluid pulled off with PD-nephrology recommending transition to HD.  Patient reluctant-discussion in process.  Will await further recommendations from nephrology team.    Anasarca: Nephrology following-volume removal with PD-see above regarding concern for ultrafiltration-with tentative plans to transition to hemodialysis patient is agreeable.  Headache: Suspect migraine headaches-responded to IV Depakote-but has had recurrence of these headaches-apparently does respond to Tylenol.  HTN: Controlled-continue labetalol, losartan  Insulin-dependent DM-2 with episodes of hypoglycemia: CBG stable overnight-Lantus has been decreased to 14 units daily-SSI has been changed to a very sensitive scale.  Follow and optimize  Recent Labs    07/14/19 2112 07/15/19 0727 07/15/19 1222  GLUCAP 79 110* 112*   GERD: Continue PPI  Obesity: Estimated body mass index is 43.39 kg/m as calculated from the following:   Height as of 05/01/19: 5\' 8"  (1.727 m).   Weight as of this encounter: 129.5 kg.    Diet: Diet Order            Diet renal/carb modified with fluid restriction Diet-HS Snack? Nothing; Fluid restriction: 1200 mL Fluid; Room service appropriate? Yes; Fluid consistency: Thin  Diet effective now              Code Status: Full code   Family Communication: Spoke with spouse on 4/19  Disposition Plan: Likely home with home health services on discharge  Barriers to Discharge: PD catheter associated peritonitis requiring IV antibiotics, severe anemia, anasarca-may require switch to hemodialysis.  Antimicrobial agents: Anti-infectives (From admission, onward)   Start     Dose/Rate Route  Frequency Ordered Stop   07/12/19 0845  vancomycin (VANCOREADY) IVPB 2000 mg/400 mL     2,000 mg 200 mL/hr over 120 Minutes Intravenous  Once 07/12/19 0837 07/12/19 1243   07/11/19 1456   vancomycin variable dose per unstable renal function (pharmacist dosing)      Does not apply See admin instructions 07/11/19 1456     07/09/19 1800  vancomycin (VANCOCIN) 50 mg/mL oral solution 125 mg  Status:  Discontinued     125 mg Oral 4 times daily 07/09/19 1614 07/10/19 1546   07/09/19 1700  cefTRIAXone (ROCEPHIN) 2 g in sodium chloride 0.9 % 100 mL IVPB  Status:  Discontinued     2 g 200 mL/hr over 30 Minutes Intravenous Every 24 hours 07/09/19 1614 07/11/19 1451   07/09/19 1400  cefTAZidime (FORTAZ) 2 g in sodium chloride 0.9 % 100 mL IVPB     2 g 200 mL/hr over 30 Minutes Intravenous  Once 07/09/19 1354 07/09/19 1900   07/09/19 1345  vancomycin (VANCOREADY) IVPB 2000 mg/400 mL    Note to Pharmacy: For peritonitis in PD patient   2,000 mg 200 mL/hr over 120 Minutes Intravenous  Once 07/09/19 1336 07/09/19 1851   07/09/19 1345  cefTAZidime (FORTAZ) 2 g in sodium chloride 0.9 % 100 mL IVPB  Status:  Discontinued    Note to Pharmacy: For peritonitis in PD patient   2 g 200 mL/hr over 30 Minutes Intravenous  Once 07/09/19 1336 07/09/19 1354       Time spent: 25 minutes-Greater than 50% of this time was spent in counseling, explanation of diagnosis, planning of further management, and coordination of care.  MEDICATIONS: Scheduled Meds: . aspirin EC  81 mg Oral Daily  . calcitRIOL  0.5 mcg Oral Q MTWThF   And  . calcitRIOL  1 mcg Oral Once per day on Sun Sat  . calcium acetate  1,334 mg Oral TID WC  . gentamicin cream  1 application Topical Daily  . heparin injection (subcutaneous)  5,000 Units Subcutaneous Q8H  . insulin aspart  0-9 Units Subcutaneous TID AC & HS  . insulin glargine  14 Units Subcutaneous Daily  . labetalol  300 mg Oral BID  . losartan  100 mg Oral Daily  . pantoprazole  40 mg Oral Daily  . vancomycin variable dose per unstable renal function (pharmacist dosing)   Does not apply See admin instructions   Continuous Infusions: . dialysis solution 2.5%  low-MG/low-CA Stopped (07/14/19 1555)  . dialysis solution 4.25% low-MG/low-CA     PRN Meds:.acetaminophen, dianeal solution for CAPD/CCPD with heparin, dianeal solution for CAPD/CCPD with heparin, hydrALAZINE, ondansetron **OR** ondansetron (ZOFRAN) IV, oxyCODONE-acetaminophen, polyethylene glycol   PHYSICAL EXAM: Vital signs: Vitals:   07/14/19 2032 07/15/19 0431 07/15/19 1222 07/15/19 1318  BP: (!) 156/92 (!) 159/69 (!) 171/86 140/61  Pulse: 65 66 65 68  Resp: 18 18    Temp: 98.2 F (36.8 C) 97.9 F (36.6 C) 97.6 F (36.4 C)   TempSrc: Oral Oral Axillary   SpO2: 98% 98%    Weight:       Filed Weights   07/13/19 0636 07/13/19 1935 07/14/19 0540  Weight: 127.9 kg 127.8 kg 129.5 kg   Body mass index is 43.39 kg/m.   Gen Exam:Alert awake-not in any distress HEENT:atraumatic, normocephalic Chest: B/L clear to auscultation anteriorly CVS:S1S2 regular Abdomen:soft non tender, non distended Extremities:++ edema Neurology: Non focal Skin: no rash  I have personally reviewed following labs and imaging studies  LABORATORY DATA: CBC: Recent Labs  Lab 07/10/19 0419 07/10/19 0419 07/11/19 0358 07/12/19 0345 07/13/19 0902 07/14/19 0553 07/15/19 0413  WBC 7.6   < > 6.7 7.7 8.0 6.1 6.7  NEUTROABS 5.1  --   --   --   --   --   --   HGB 7.2*   < > 7.2* 7.3* 7.5* 7.8* 7.4*  HCT 23.9*   < > 24.3* 24.9* 24.8* 26.0* 24.0*  MCV 74.9*   < > 76.4* 75.9* 75.8* 75.8* 75.5*  PLT 200   < > 185 187 175 185 190   < > = values in this interval not displayed.    Basic Metabolic Panel: Recent Labs  Lab 07/10/19 0419 07/10/19 0419 07/11/19 0358 07/12/19 0345 07/13/19 0902 07/14/19 0553 07/15/19 0413  NA 132*   < > 133* 133* 134* 132* 133*  K 3.8   < > 4.0 4.0 3.7 3.6 3.4*  CL 91*   < > 91* 91* 92* 90* 89*  CO2 21*   < > 22 22 21* 22 22  GLUCOSE 154*   < > 192* 174* 114* 122* 113*  BUN 70*   < > 69* 69* 67* 65* 63*  CREATININE 11.55*   < > 11.60* 12.07* 12.23* 12.60* 12.57*    CALCIUM 8.4*   < > 8.9 9.1 9.1 9.1 9.1  MG 1.6*  --   --   --   --   --   --   PHOS  --   --  8.4* 8.7* 7.8* 7.8* 7.3*   < > = values in this interval not displayed.    GFR: Estimated Creatinine Clearance: 7.5 mL/min (A) (by C-G formula based on SCr of 12.57 mg/dL (H)).  Liver Function Tests: Recent Labs  Lab 07/08/19 1959 07/08/19 1959 07/10/19 0419 07/10/19 0419 07/11/19 0358 07/12/19 0345 07/13/19 0902 07/14/19 0553 07/15/19 0413  AST 11*  --  11*  --   --   --   --   --   --   ALT 13  --  10  --   --   --   --   --   --   ALKPHOS 57  --  47  --   --   --   --   --   --   BILITOT 0.8  --  0.8  --   --   --   --   --   --   PROT 7.5  --  7.0  --   --   --   --   --   --   ALBUMIN 2.3*   < > 2.1*   < > 2.1* 2.2* 2.3* 2.3* 2.1*   < > = values in this interval not displayed.   No results for input(s): LIPASE, AMYLASE in the last 168 hours. No results for input(s): AMMONIA in the last 168 hours.  Coagulation Profile: No results for input(s): INR, PROTIME in the last 168 hours.  Cardiac Enzymes: No results for input(s): CKTOTAL, CKMB, CKMBINDEX, TROPONINI in the last 168 hours.  BNP (last 3 results) No results for input(s): PROBNP in the last 8760 hours.  Lipid Profile: No results for input(s): CHOL, HDL, LDLCALC, TRIG, CHOLHDL, LDLDIRECT in the last 72 hours.  Thyroid Function Tests: No results for input(s): TSH, T4TOTAL, FREET4, T3FREE, THYROIDAB in the last 72 hours.  Anemia Panel: No results for input(s): VITAMINB12, FOLATE, FERRITIN, TIBC, IRON, RETICCTPCT in the last 72  hours.  Urine analysis:    Component Value Date/Time   COLORURINE AMBER (A) 07/31/2018 1800   APPEARANCEUR TURBID (A) 07/31/2018 1800   LABSPEC >1.046 (H) 07/31/2018 1800   PHURINE 5.0 07/31/2018 1800   GLUCOSEU 150 (A) 07/31/2018 1800   HGBUR SMALL (A) 07/31/2018 1800   BILIRUBINUR NEGATIVE 07/31/2018 1800   KETONESUR NEGATIVE 07/31/2018 1800   PROTEINUR >=300 (A) 07/31/2018 1800    NITRITE NEGATIVE 07/31/2018 1800   LEUKOCYTESUR MODERATE (A) 07/31/2018 1800    Sepsis Labs: Lactic Acid, Venous No results found for: LATICACIDVEN  MICROBIOLOGY: Recent Results (from the past 240 hour(s))  SARS CORONAVIRUS 2 (TAT 6-24 HRS) Nasopharyngeal Nasopharyngeal Swab     Status: None   Collection Time: 07/08/19 10:04 PM   Specimen: Nasopharyngeal Swab  Result Value Ref Range Status   SARS Coronavirus 2 NEGATIVE NEGATIVE Final    Comment: (NOTE) SARS-CoV-2 target nucleic acids are NOT DETECTED. The SARS-CoV-2 RNA is generally detectable in upper and lower respiratory specimens during the acute phase of infection. Negative results do not preclude SARS-CoV-2 infection, do not rule out co-infections with other pathogens, and should not be used as the sole basis for treatment or other patient management decisions. Negative results must be combined with clinical observations, patient history, and epidemiological information. The expected result is Negative. Fact Sheet for Patients: SugarRoll.be Fact Sheet for Healthcare Providers: https://www.woods-mathews.com/ This test is not yet approved or cleared by the [place] FDA and  has been authorized for detection and/or diagnosis of SARS-CoV-2 by FDA under an Emergency Use Authorization (EUA). This EUA will remain  in effect (meaning this test can be used) for the duration of the COVID-19 declaration under Section 56 4(b)(1) of the Act, 21 U.S.C. section 360bbb-3(b)(1), unless the authorization is terminated or revoked sooner. Performed at [facility], [place] [address], [place], [place] [address]   Gram stain     Status: None   Collection Time: 07/09/19  8:55 AM   Specimen: Abdomen; Peritoneal Fluid  Result Value Ref Range Status   Specimen Description ABDOMEN  Final   Special Requests NONE  Final   Gram Stain   Final    WBC PRESENT,BOTH PMN AND MONONUCLEAR GRAM POSITIVE  COCCI CYTOSPIN SMEAR Gram Stain Report Called to,Read Back By and Verified WithKym Groom RN 1700 07/09/19 A BROWNING Performed at [facility], [place] [address] [place], [place] [address]    Report Status 07/09/2019 FINAL  Final  Culture, body fluid-bottle     Status: Abnormal   Collection Time: 07/09/19  8:55 AM   Specimen: Peritoneal Washings  Result Value Ref Range Status   Specimen Description PERITONEAL ABDOMEN  Final   Special Requests NONE  Final   Gram Stain   Final    GRAM POSITIVE COCCI IN CLUSTERS IN BOTH AEROBIC AND ANAEROBIC BOTTLES CRITICAL RESULT CALLED TO, READ BACK BY AND VERIFIED WITH: Manya Silvas [id number] 07/10/2019 Mena Goes Performed at [facility], [place] [address] [place], [place] [address]    Culture STAPHYLOCOCCUS EPIDERMIDIS (A)  Final   Report Status 07/12/2019 FINAL  Final   Organism ID, Bacteria STAPHYLOCOCCUS EPIDERMIDIS  Final      Susceptibility   Staphylococcus epidermidis - MIC*    CIPROFLOXACIN 4 RESISTANT Resistant     ERYTHROMYCIN >=8 RESISTANT Resistant     GENTAMICIN <=0.5 SENSITIVE Sensitive     OXACILLIN >=4 RESISTANT Resistant     TETRACYCLINE 2 SENSITIVE Sensitive  VANCOMYCIN 1 SENSITIVE Sensitive     TRIMETH/SULFA 20 SENSITIVE Sensitive     CLINDAMYCIN >=8 RESISTANT Resistant     RIFAMPIN <=0.5 SENSITIVE Sensitive     Inducible Clindamycin NEGATIVE Sensitive     * STAPHYLOCOCCUS EPIDERMIDIS  C Difficile Quick Screen w PCR reflex     Status: Abnormal   Collection Time: 07/09/19  2:27 PM   Specimen: STOOL  Result Value Ref Range Status   C Diff antigen POSITIVE (A) NEGATIVE Final   C Diff toxin NEGATIVE NEGATIVE Final   C Diff interpretation Results are indeterminate. See PCR results.  Final    Comment: Performed at [facility], [place] [address], [place], [place] [address]  C. Diff by PCR, Reflexed     Status: Abnormal   Collection Time: 07/09/19  2:27 PM  Result Value Ref Range Status   Toxigenic C. Difficile by  PCR POSITIVE (A) NEGATIVE Final    Comment: Positive for toxigenic C. difficile with little to no toxin production. Only treat if clinical presentation suggests symptomatic illness. Performed at [facility], [place] [address] [place], [place] [address]   Body fluid culture     Status: None (Preliminary result)   Collection Time: 07/14/19  1:12 PM   Specimen: Peritoneal Dialysate; Body Fluid  Result Value Ref Range Status   Specimen Description PERITONEAL DIALYSATE  Final   Special Requests Immunocompromised  Final   Gram Stain NO WBC SEEN NO ORGANISMS SEEN   Final   Culture   Final    RARE STAPHYLOCOCCUS EPIDERMIDIS CULTURE REINCUBATED FOR BETTER GROWTH Performed at [facility], [address], [place], [place] [address]    Report Status PENDING  Incomplete    RADIOLOGY STUDIES/RESULTS: No results found.   LOS: 6 days   Oren Binet, MD  [facility]    To contact the attending provider between 7A-7P or the covering provider during after hours 7P-7A, please log into the web site [email or web address] and access using universal [place] password for that web site. If you do not have the password, please call the hospital operator.  07/15/2019, 2:24 PM

## 2019-07-15 NOTE — Progress Notes (Signed)
[facility] Progress Note   Subjective:  Seen in room, still sig LE and flank edema  Objective Vitals:   07/15/19 1222 07/15/19 1318 07/15/19 1528 07/15/19 1707  BP: (!) 171/86 140/61 140/63 (!) 147/74  Pulse: 65 68 65 68  Resp:    18  Temp: 97.6 F (36.4 C)   97.7 F (36.5 C)  TempSrc: Axillary   Oral  SpO2:   99% 98%  Weight:    127.4 kg   Physical Exam General: Overweight woman, well appearing, NAD Heart: RRR; no murmur Lungs: CTA anteriorly Abdomen: soft, non-tender Extremities: 2+ LE edema up to hips Dialysis Access: PD cath in R abdomen, non-tender  Additional Objective Labs: Basic Metabolic Panel: Recent Labs  Lab 07/13/19 0902 07/14/19 0553 07/15/19 0413  NA 134* 132* 133*  K 3.7 3.6 3.4*  CL 92* 90* 89*  CO2 21* 22 22  GLUCOSE 114* 122* 113*  BUN 67* 65* 63*  CREATININE 12.23* 12.60* 12.57*  CALCIUM 9.1 9.1 9.1  PHOS 7.8* 7.8* 7.3*   Liver Function Tests: Recent Labs  Lab 07/08/19 1959 07/08/19 1959 07/10/19 0419 07/11/19 0358 07/13/19 0902 07/14/19 0553 07/15/19 0413  AST 11*  --  11*  --   --   --   --   ALT 13  --  10  --   --   --   --   ALKPHOS 57  --  47  --   --   --   --   BILITOT 0.8  --  0.8  --   --   --   --   PROT 7.5  --  7.0  --   --   --   --   ALBUMIN 2.3*   < > 2.1*   < > 2.3* 2.3* 2.1*   < > = values in this interval not displayed.   CBC: Recent Labs  Lab 07/10/19 0419 07/10/19 0419 07/11/19 0358 07/11/19 0358 07/12/19 0345 07/12/19 0345 07/13/19 0902 07/14/19 0553 07/15/19 0413  WBC 7.6   < > 6.7   < > 7.7   < > 8.0 6.1 6.7  NEUTROABS 5.1  --   --   --   --   --   --   --   --   HGB 7.2*   < > 7.2*   < > 7.3*   < > 7.5* 7.8* 7.4*  HCT 23.9*   < > 24.3*   < > 24.9*   < > 24.8* 26.0* 24.0*  MCV 74.9*   < > 76.4*  --  75.9*  --  75.8* 75.8* 75.5*  PLT 200   < > 185   < > 187   < > 175 185 190   < > = values in this interval not displayed.   Blood Culture    Component Value Date/Time   SDES  PERITONEAL DIALYSATE 07/14/2019 1312   SPECREQUEST Immunocompromised 07/14/2019 1312   CULT  07/14/2019 1312    RARE STAPHYLOCOCCUS EPIDERMIDIS CULTURE REINCUBATED FOR BETTER GROWTH Performed at [facility], [place] [address], [place], [place] [address]    REPTSTATUS PENDING 07/14/2019 1312   Medications: . dialysis solution 2.5% low-MG/low-CA Stopped (07/14/19 1555)  . dialysis solution 4.25% low-MG/low-CA     . aspirin EC  81 mg Oral Daily  . calcitRIOL  0.5 mcg Oral Q MTWThF   And  . calcitRIOL  1 mcg Oral Once per day on Sun Sat  .  calcium acetate  1,334 mg Oral TID WC  . gentamicin cream  1 application Topical Daily  . heparin injection (subcutaneous)  5,000 Units Subcutaneous Q8H  . insulin aspart  0-6 Units Subcutaneous TID WC  . insulin glargine  14 Units Subcutaneous Daily  . labetalol  300 mg Oral BID  . losartan  100 mg Oral Daily  . pantoprazole  40 mg Oral Daily  . vancomycin variable dose per unstable renal function (pharmacist dosing)   Does not apply See admin instructions    [email or web address] CCPD: 5 cycles overnight, dwell volume of 2000, combo 2.5%, 4.25%, extraneal last fill.  Assessment: 54 year old black female with ESRD on PD - presenting with symptomatic anemia. However, found to have peritonitis and significant volume overload with low albumin.  Plan: 1. Acute on chronicanemia: Hgb 6.5 on admit - s/p 1U PRBCs. Given course IV iron, s/p ranesp 264mcg [place] on 4/13. Hgb 7.8 today - better. 2. Peritonitis: PD Fluid Cx grew MRSE - on Vanc, per pharmacy. No more abdominal pain, resolving.  Repeat TNC is down.  3. ESRD on CCPD. Had been told to use 2.5% solution and 4.25 % solution by OP provider but was admitted here before she attempted this. Had rapid cycling CCPDon admissionfor peritonitis. Trialed all 4.25% due to lower than expected UF - developed symptomatic hypotension. Now back to half 2.5%, half 4.25%.  Alb remains very low.Discussedchanging from PD to HD until albumin/volume status improves butshe continues to decline.            - PD Rx changed to 6 exchanges, 2.5L, dwell time 2hr on 4/17 using 2/3 4.25%, 1/3 2.5% bags. Heparin also added 4/17 d/t fibrin in PD effluent. Awaiting UF calculations to see if needs further changes tonight. Had previously discussed may need to change to HD, discussed again with patient this am and she wants to d/w her kidney doctor.  4. Diarrhea (off and on x years): C Diff antigen positive, toxin negative. Completed PO Vanc. 5. Hypertension: Volume and BP up on admit, then hypotension after change in PD fluid Rx. BP high again with lower UF than expected. Awaiting UF total for today. 6. Metabolic Bone Disease:CorrCa and Phos slightly high. Continue home calcitriol. Consider changing Phoslo to non-Ca binder if rises any further. 7. Nutrition: Alb low - adding pro-stat supplements. 8. DM: per primary    Kelly Splinter, MD 07/15/2019, 5:18 PM

## 2019-07-16 ENCOUNTER — Ambulatory Visit: Payer: Medicare Other | Admitting: Family Medicine

## 2019-07-16 LAB — RENAL FUNCTION PANEL
Albumin: 2.1 g/dL — ABNORMAL LOW (ref 3.5–5.0)
Anion gap: 18 — ABNORMAL HIGH (ref 5–15)
BUN: 62 mg/dL — ABNORMAL HIGH (ref 6–20)
CO2: 24 mmol/L (ref 22–32)
Calcium: 9.1 mg/dL (ref 8.9–10.3)
Chloride: 91 mmol/L — ABNORMAL LOW (ref 98–111)
Creatinine, Ser: 12.37 mg/dL — ABNORMAL HIGH (ref 0.44–1.00)
GFR calc Af Amer: 4 mL/min — ABNORMAL LOW (ref >60)
GFR calc non Af Amer: 3 mL/min — ABNORMAL LOW (ref >60)
Glucose, Bld: 165 mg/dL — ABNORMAL HIGH (ref 70–99)
Phosphorus: 6.9 mg/dL — ABNORMAL HIGH (ref 2.5–4.6)
Potassium: 3.4 mmol/L — ABNORMAL LOW (ref 3.5–5.1)
Sodium: 133 mmol/L — ABNORMAL LOW (ref 135–145)

## 2019-07-16 LAB — GLUCOSE, CAPILLARY
Glucose-Capillary: 152 mg/dL — ABNORMAL HIGH (ref 70–99)
Glucose-Capillary: 112 mg/dL — ABNORMAL HIGH (ref 70–99)
Glucose-Capillary: 105 mg/dL — ABNORMAL HIGH (ref 70–99)
Glucose-Capillary: 127 mg/dL — ABNORMAL HIGH (ref 70–99)

## 2019-07-16 MED ORDER — HEPARIN SODIUM (PORCINE) 5000 UNIT/ML IJ SOLN
5000.0000 [IU] | Freq: Three times a day (TID) | INTRAMUSCULAR | Status: AC
  Administered 2019-07-16: 5000 [IU] via SUBCUTANEOUS
  Filled 2019-07-16: qty 1

## 2019-07-16 MED ORDER — POTASSIUM CHLORIDE CRYS ER 20 MEQ PO TBCR
40.0000 meq | EXTENDED_RELEASE_TABLET | Freq: Once | ORAL | Status: AC
  Administered 2019-07-16: 40 meq via ORAL
  Filled 2019-07-16: qty 2

## 2019-07-16 NOTE — Progress Notes (Signed)
Occupational Therapy Treatment Patient Details Name: Janet Mitchell MRN: [id number] DOB: 1965-12-04 Today's Date: 07/16/2019    History of present illness 54 year old female with history of ESRD on PD, HTN, DM-2, chronic diarrhea for the past 2 years-sent to [facility] ED for low hemoglobin/hematocrit and evaluation of worsening abdominal pain.  Found to have a hemoglobin of 6.5 and PD catheter associated peritonitis. Nephrology following and recommending HD.   OT comments  Pt progressing with OT goals gradually, decreased nausea and headache pain this session compared to evaluation. Pt Modified Independent for bed mobility with increased time. Pt min guard for sit to stand from bed side with RW and stand pivot to recliner chair. Pt overall supervision for mobility in room with RW - pt with improved steadiness using RW compared to no AD. Pt completed only one bout of mobility in room, declined to attempt again or other ADLs. Encouraged pt to sit up in recliner for lunch, pending updated status of procedure this afternoon. Pt appeared more sullen today, reports ready to go home. BP also elevated (see below). Discussed DC and recommended husband to take off of work for short period when pt returns home to maximize safety and well-being.   Follow Up Recommendations  Home health OT;Supervision - Intermittent    Equipment Recommendations  None recommended by OT    Recommendations for Other Services      Precautions / Restrictions Precautions Precautions: Fall Restrictions Weight Bearing Restrictions: No       Mobility Bed Mobility Overal bed mobility: Modified Independent             General bed mobility comments: HOB elevated and with increased time   Transfers Overall transfer level: Needs assistance Equipment used: Rolling walker (2 wheeled) Transfers: Sit to/from [facility] Sit to Stand: Min guard;From elevated surface Stand pivot transfers: Min guard        General transfer comment: min guard for sit to stand from elevated bed. pt required increased time to achieve full posture with forward flexion noted. Pt min guard to ensure safety with turning and RW mgmt    Balance Overall balance assessment: Needs assistance Sitting-balance support: Feet supported Sitting balance-Leahy Scale: Good     Standing balance support: Bilateral upper extremity supported Standing balance-Leahy Scale: Fair                             ADL either performed or assessed with clinical judgement   ADL Overall ADL's : Needs assistance/impaired                                     Functional mobility during ADLs: Supervision/safety;Rolling walker General ADL Comments: Pt limited with LB ADLs due to weakness, nausea, headache, as well as B LE swelling      Vision       Perception     Praxis      Cognition Arousal/Alertness: Awake/alert Behavior During Therapy: WFL for tasks assessed/performed;Flat affect Overall Cognitive Status: Within Functional Limits for tasks assessed                                 General Comments: Pt appeared a bit more sullen today, ready to leave hospital         Exercises  Shoulder Instructions       General Comments BP 170/63 sitting EOB, nurse aware. After acitivty, BP at 146/58. Pt reported mild dizziness and headache during session     Pertinent Vitals/ Pain       Pain Assessment: Faces Faces Pain Scale: Hurts a little bit Pain Location: headache Pain Descriptors / Indicators: Headache Pain Intervention(s): Limited activity within patient's tolerance;Monitored during session  Home Living                                          Prior Functioning/Environment              Frequency  Min 2X/week        Progress Toward Goals  OT Goals(current goals can now be found in the care plan section)  Progress towards OT goals: Progressing toward  goals  Acute Rehab OT Goals Patient Stated Goal: get well enough to go home OT Goal Formulation: With patient Time For Goal Achievement: 07/26/19 Potential to Achieve Goals: Good ADL Goals Pt Will Perform Grooming: with modified independence;standing Pt Will Perform Upper Body Bathing: with modified independence;standing;sitting Pt Will Perform Lower Body Bathing: with supervision;sitting/lateral leans;sit to/from stand Pt Will Transfer to Toilet: with supervision;ambulating;regular height toilet Pt Will Perform Toileting - Clothing Manipulation and hygiene: with modified independence;sit to/from stand;sitting/lateral leans  Plan Discharge plan remains appropriate    Co-evaluation                 AM-PAC OT "6 Clicks" Daily Activity     Outcome Measure   Help from another person eating meals?: None Help from another person taking care of personal grooming?: A Little Help from another person toileting, which includes using toliet, bedpan, or urinal?: A Little Help from another person bathing (including washing, rinsing, drying)?: A Little Help from another person to put on and taking off regular upper body clothing?: A Little Help from another person to put on and taking off regular lower body clothing?: A Little 6 Click Score: 19    End of Session Equipment Utilized During Treatment: Rolling walker  OT Visit Diagnosis: Unsteadiness on feet (R26.81);Other abnormalities of gait and mobility (R26.89);Muscle weakness (generalized) (M62.81);History of falling (Z91.81)   Activity Tolerance Patient limited by fatigue   Patient Left in chair;with call bell/phone within reach;with nursing/sitter in room   Nurse Communication Mobility status;Other (comment)(BP readings )        Time: 4193-7902 OT Time Calculation (min): 27 min  Charges: OT General Charges $OT Visit: 1 Visit OT Treatments $Therapeutic Activity: 23-37 mins  Layla Maw, OTR/L   Layla Maw 07/16/2019,  3:18 PM

## 2019-07-16 NOTE — Progress Notes (Signed)
[facility] Progress Note   Subjective:  Seen in room, no new c/o  Objective Vitals:   07/16/19 0534 07/16/19 0800 07/16/19 1029 07/16/19 1200  BP:  (!) 190/80  (!) 165/84  Pulse:  69 70 69  Resp:  20    Temp:  98.2 F (36.8 C)    TempSrc:  Axillary    SpO2:  97% 95% 93%  Weight: 128.9 kg      Physical Exam General: Overweight woman, well appearing, NAD Heart: RRR; no murmur Lungs: CTA anteriorly Abdomen: soft, non-tender Extremities: 2-3+ pitting leg/ hip/ pannus edema Dialysis Access: PD cath in R abdomen, non-tender  Additional Objective Labs: Basic Metabolic Panel: Recent Labs  Lab 07/14/19 0553 07/15/19 0413 07/16/19 0353  NA 132* 133* 133*  K 3.6 3.4* 3.4*  CL 90* 89* 91*  CO2 22 22 24   GLUCOSE 122* 113* 165*  BUN 65* 63* 62*  CREATININE 12.60* 12.57* 12.37*  CALCIUM 9.1 9.1 9.1  PHOS 7.8* 7.3* 6.9*   Liver Function Tests: Recent Labs  Lab 07/10/19 0419 07/11/19 0358 07/14/19 0553 07/15/19 0413 07/16/19 0353  AST 11*  --   --   --   --   ALT 10  --   --   --   --   ALKPHOS 47  --   --   --   --   BILITOT 0.8  --   --   --   --   PROT 7.0  --   --   --   --   ALBUMIN 2.1*   < > 2.3* 2.1* 2.1*   < > = values in this interval not displayed.   CBC: Recent Labs  Lab 07/10/19 0419 07/10/19 0419 07/11/19 0358 07/11/19 0358 07/12/19 0345 07/12/19 0345 07/13/19 0902 07/14/19 0553 07/15/19 0413  WBC 7.6   < > 6.7   < > 7.7   < > 8.0 6.1 6.7  NEUTROABS 5.1  --   --   --   --   --   --   --   --   HGB 7.2*   < > 7.2*   < > 7.3*   < > 7.5* 7.8* 7.4*  HCT 23.9*   < > 24.3*   < > 24.9*   < > 24.8* 26.0* 24.0*  MCV 74.9*   < > 76.4*  --  75.9*  --  75.8* 75.8* 75.5*  PLT 200   < > 185   < > 187   < > 175 185 190   < > = values in this interval not displayed.   Blood Culture    Component Value Date/Time   SDES PERITONEAL DIALYSATE 07/14/2019 1312   SPECREQUEST Immunocompromised 07/14/2019 1312   CULT  07/14/2019 1312    FEW  STAPHYLOCOCCUS EPIDERMIDIS SUSCEPTIBILITIES TO FOLLOW Performed at [facility] [address] [place], [place] [address]    REPTSTATUS PENDING 07/14/2019 1312   Medications: . dialysis solution 2.5% low-MG/low-CA Stopped (07/14/19 1555)  . dialysis solution 4.25% low-MG/low-CA     . aspirin EC  81 mg Oral Daily  . calcitRIOL  0.5 mcg Oral Q [id number]   And  . calcitRIOL  1 mcg Oral Once per day on Sun Sat  . calcium acetate  1,334 mg Oral TID WC  . gentamicin cream  1 application Topical Daily  . heparin injection (subcutaneous)  5,000 Units Subcutaneous Q8H  . insulin aspart  0-6 Units Subcutaneous TID  WC  . insulin glargine  14 Units Subcutaneous Daily  . labetalol  300 mg Oral BID  . losartan  100 mg Oral Daily  . pantoprazole  40 mg Oral Daily  . vancomycin variable dose per unstable renal function (pharmacist dosing)   Does not apply See admin instructions    [email or web address]. CCPD: 5 cycles overnight, dwell volume of 2000, combo 2.5%, 4.25%, extraneal last fill.  Assessment: 54 year old black female with ESRD on PD - presenting with symptomatic anemia. However, found to have peritonitis and significant volume overload with low albumin.  Plan: 1. Acute on chronicanemia: Hgb 6.5 on admit - s/p 1U PRBCs. Given course IV iron, s/p ranesp 240mcg [place] on 4/13. Hgb 7.8 today - better. 2. Peritonitis: PD Fluid Cx + MRSE - on Vanc, per pharmacy. No more abdominal pain, resolving.  Repeat TNC is down. Cont Vanc 3. ESRD on CCPD - failing PD w/ nausea and vol overload unresponsive to aggressive PD Rx here. Suspect membrane failure. Pt agrees to proceed w/ HD. Consulting IR to eval for TDC. Ask VVS to see for perm access. Will start CLIP process, her nephrologist is in [place].  4. Volume overload - significant issue, HD will help 4. Diarrhea (off and on x years): C Diff antigen positive, toxin negative. Completed PO  Vanc. 5. Hypertension: vol excess will be addressed w HD 6. Metabolic Bone Disease:CorrCa and Phos slightly high. Continue home calcitriol. Consider changing Phoslo to non-Ca binder if rises any further. 7. Nutrition: Alb low - adding pro-stat supplements. 8. DM: per primary    Kelly Splinter, MD 07/16/2019, 12:49 PM

## 2019-07-16 NOTE — Progress Notes (Signed)
Throughout night, CCMD reports that pt desats as low as 44% before immediately returning to mid-90s. Pt is asymptomatic and denies use of O2 or CPAP while sleeping. MD paged as an Juluis Rainier.

## 2019-07-16 NOTE — Progress Notes (Addendum)
PROGRESS NOTE        PATIENT DETAILS Name: Janet Mitchell Age: 54 y.o. Sex: female Date of Birth: [birth year] Admit Date: 07/08/2019 Admitting Physician Darliss Cheney, MD ERX:[id number], Myra Rude, MD  Brief Narrative: Patient is a 54 y.o. female with history of ESRD on PD, HTN, DM-2, chronic diarrhea for the past 2 years-sent to [facility] ED for low hemoglobin/hematocrit and evaluation of worsening abdominal pain.  Found to have a hemoglobin of 6.5 and PD catheter associated peritonitis.  See below for further details.   Significant events: 4/12>> admit to [facility] for severe anemia and PD catheter associated peritonitis  Antimicrobial therapy: Vancomycin: 4/13>> Rocephin: 4/13>>4/15  Microbiology data: 4/13>> peritoneal fluid culture: Staph epidermidis  Procedures : None  Consults: Nephrology  DVT Prophylaxis : Prophylactic Heparin   Subjective: No abdominal pain-no diarrhea-headaches overall better still continues to have intermittent headaches. Currently agreed to transition from PD to HD  Assessment/Plan: Symptomatic anemia: No indication of blood loss-suspect related to underlying CKD.  Patient is s/p 1 unit of PRBC transfusion-IV iron/Aranesp per nephrology.  Hemoglobin slowly improving-follow periodically.  PD catheter associated peritonitis: Abdominal pain has improved-exam is benign-peritoneal fluid culture positive for staph epidermidis-continue IV vancomycin.  Diarrhea: Chronic issue-do not think she has C. difficile colitis.  She was in fact "constipated" for 2 weeks prior to admission-following which she had around 4-5 bowel movements (baseline) on the day of admission.  There has been no diarrhea since since then.  Suspect C. Difficile studies are are more indicative of colonization rather than infection.  No longer on oral vancomycin.  Per patient-she has seen GI MD in [place] for chronic diarrhea-and at one point was placed on cholestyramine that she  stopped taking.  ESRD on PD: Per nephrology-failed PD is still remains volume overloaded in spite of maximal changes to PD regimen-patient has finally consented to transition to hemodialysis. Nephrology following.   Anasarca: Concerned that she has failed peritoneal dialysis-remains massively volume overloaded-she has finally consented to transition to hemodialysis.   Headache: Suspect migraine headaches-responded to IV Depakote-but has had recurrence of these headaches-apparently does respond to Tylenol.  HTN: Fluctuating-on the higher side today-continue labetalol, losartan-reassess tomorrow  Insulin-dependent DM-2 with episodes of hypoglycemia: CBG now stable-continue Lantus 14 units and SSI. Follow.   Recent Labs    07/15/19 2132 07/16/19 0739 07/16/19 1154  GLUCAP 159* 152* 112*   GERD: Continue PPI  Obesity: Estimated body mass index is 43.21 kg/m as calculated from the following:   Height as of 05/01/19: 5\' 8"  (1.727 m).   Weight as of this encounter: 128.9 kg.    Diet: Diet Order            Diet NPO time specified  Diet effective midnight        Diet renal/carb modified with fluid restriction Diet-HS Snack? Nothing; Fluid restriction: 1200 mL Fluid; Room service appropriate? Yes; Fluid consistency: Thin  Diet effective now              Code Status: Full code   Family Communication: Spoke with spouse on 4/19-we will update on 4/21  Disposition Plan: Likely home with home health services on discharge  Barriers to Discharge: Peritoneal dialysis catheter associated peritonitis remains on IV vancomycin-anasarca resistant to PD with plans to switch to hemodialysis  Antimicrobial agents: Anti-infectives (From admission, onward)   Start  Dose/Rate Route Frequency Ordered Stop   07/12/19 0845  vancomycin (VANCOREADY) IVPB 2000 mg/400 mL     2,000 mg 200 mL/hr over 120 Minutes Intravenous  Once 07/12/19 0837 07/12/19 1243   07/11/19 1456  vancomycin variable  dose per unstable renal function (pharmacist dosing)      Does not apply See admin instructions 07/11/19 1456     07/09/19 1800  vancomycin (VANCOCIN) 50 mg/mL oral solution 125 mg  Status:  Discontinued     125 mg Oral 4 times daily 07/09/19 1614 07/10/19 1546   07/09/19 1700  cefTRIAXone (ROCEPHIN) 2 g in sodium chloride 0.9 % 100 mL IVPB  Status:  Discontinued     2 g 200 mL/hr over 30 Minutes Intravenous Every 24 hours 07/09/19 1614 07/11/19 1451   07/09/19 1400  cefTAZidime (FORTAZ) 2 g in sodium chloride 0.9 % 100 mL IVPB     2 g 200 mL/hr over 30 Minutes Intravenous  Once 07/09/19 1354 07/09/19 1900   07/09/19 1345  vancomycin (VANCOREADY) IVPB 2000 mg/400 mL    Note to Pharmacy: For peritonitis in PD patient   2,000 mg 200 mL/hr over 120 Minutes Intravenous  Once 07/09/19 1336 07/09/19 1851   07/09/19 1345  cefTAZidime (FORTAZ) 2 g in sodium chloride 0.9 % 100 mL IVPB  Status:  Discontinued    Note to Pharmacy: For peritonitis in PD patient   2 g 200 mL/hr over 30 Minutes Intravenous  Once 07/09/19 1336 07/09/19 1354       Time spent: 25 minutes-Greater than 50% of this time was spent in counseling, explanation of diagnosis, planning of further management, and coordination of care.  MEDICATIONS: Scheduled Meds: . aspirin EC  81 mg Oral Daily  . calcitRIOL  0.5 mcg Oral Q [id number]   And  . calcitRIOL  1 mcg Oral Once per day on Sun Sat  . calcium acetate  1,334 mg Oral TID WC  . gentamicin cream  1 application Topical Daily  . heparin injection (subcutaneous)  5,000 Units Subcutaneous Q8H  . insulin aspart  0-6 Units Subcutaneous TID WC  . insulin glargine  14 Units Subcutaneous Daily  . labetalol  300 mg Oral BID  . losartan  100 mg Oral Daily  . pantoprazole  40 mg Oral Daily  . vancomycin variable dose per unstable renal function (pharmacist dosing)   Does not apply See admin instructions   Continuous Infusions: . dialysis solution 2.5% low-MG/low-CA Stopped  (07/14/19 1555)  . dialysis solution 4.25% low-MG/low-CA     PRN Meds:.acetaminophen, dianeal solution for CAPD/CCPD with heparin, dianeal solution for CAPD/CCPD with heparin, hydrALAZINE, ondansetron **OR** ondansetron (ZOFRAN) IV, oxyCODONE-acetaminophen, polyethylene glycol   PHYSICAL EXAM: Vital signs: Vitals:   07/16/19 0800 07/16/19 1029 07/16/19 1200 07/16/19 1359  BP: (!) 190/80  (!) 165/84   Pulse: 69 70 69 67  Resp: 20     Temp: 98.2 F (36.8 C)     TempSrc: Axillary     SpO2: 97% 95% 93% 96%  Weight:       Filed Weights   07/15/19 1707 07/16/19 0301 07/16/19 0534  Weight: 127.4 kg 131.2 kg 128.9 kg   Body mass index is 43.21 kg/m.   Gen Exam:Alert awake-not in any distress HEENT:atraumatic, normocephalic Chest: B/L clear to auscultation anteriorly CVS:S1S2 regular Abdomen:soft non tender, non distended Extremities:++ edema Neurology: Non focal Skin: no rash I have personally reviewed following labs and imaging studies  LABORATORY DATA: CBC: Recent Labs  Lab  07/10/19 0419 07/10/19 0419 07/11/19 0358 07/12/19 0345 07/13/19 0902 07/14/19 0553 07/15/19 0413  WBC 7.6   < > 6.7 7.7 8.0 6.1 6.7  NEUTROABS 5.1  --   --   --   --   --   --   HGB 7.2*   < > 7.2* 7.3* 7.5* 7.8* 7.4*  HCT 23.9*   < > 24.3* 24.9* 24.8* 26.0* 24.0*  MCV 74.9*   < > 76.4* 75.9* 75.8* 75.8* 75.5*  PLT 200   < > 185 187 175 185 190   < > = values in this interval not displayed.    Basic Metabolic Panel: Recent Labs  Lab 07/10/19 0419 07/11/19 0358 07/12/19 0345 07/13/19 0902 07/14/19 0553 07/15/19 0413 07/16/19 0353  NA 132*   < > 133* 134* 132* 133* 133*  K 3.8   < > 4.0 3.7 3.6 3.4* 3.4*  CL 91*   < > 91* 92* 90* 89* 91*  CO2 21*   < > 22 21* 22 22 24   GLUCOSE 154*   < > 174* 114* 122* 113* 165*  BUN 70*   < > 69* 67* 65* 63* 62*  CREATININE 11.55*   < > 12.07* 12.23* 12.60* 12.57* 12.37*  CALCIUM 8.4*   < > 9.1 9.1 9.1 9.1 9.1  MG 1.6*  --   --   --   --   --    --   PHOS  --    < > 8.7* 7.8* 7.8* 7.3* 6.9*   < > = values in this interval not displayed.    GFR: Estimated Creatinine Clearance: 7.6 mL/min (A) (by C-G formula based on SCr of 12.37 mg/dL (H)).  Liver Function Tests: Recent Labs  Lab 07/10/19 0419 07/11/19 0358 07/12/19 0345 07/13/19 0902 07/14/19 0553 07/15/19 0413 07/16/19 0353  AST 11*  --   --   --   --   --   --   ALT 10  --   --   --   --   --   --   ALKPHOS 47  --   --   --   --   --   --   BILITOT 0.8  --   --   --   --   --   --   PROT 7.0  --   --   --   --   --   --   ALBUMIN 2.1*   < > 2.2* 2.3* 2.3* 2.1* 2.1*   < > = values in this interval not displayed.   No results for input(s): LIPASE, AMYLASE in the last 168 hours. No results for input(s): AMMONIA in the last 168 hours.  Coagulation Profile: No results for input(s): INR, PROTIME in the last 168 hours.  Cardiac Enzymes: No results for input(s): CKTOTAL, CKMB, CKMBINDEX, TROPONINI in the last 168 hours.  BNP (last 3 results) No results for input(s): PROBNP in the last 8760 hours.  Lipid Profile: No results for input(s): CHOL, HDL, LDLCALC, TRIG, CHOLHDL, LDLDIRECT in the last 72 hours.  Thyroid Function Tests: No results for input(s): TSH, T4TOTAL, FREET4, T3FREE, THYROIDAB in the last 72 hours.  Anemia Panel: No results for input(s): VITAMINB12, FOLATE, FERRITIN, TIBC, IRON, RETICCTPCT in the last 72 hours.  Urine analysis:    Component Value Date/Time   COLORURINE AMBER (A) 07/31/2018 1800   APPEARANCEUR TURBID (A) 07/31/2018 1800   LABSPEC >1.046 (H) 07/31/2018 1800   PHURINE 5.0 07/31/2018  1800   GLUCOSEU 150 (A) 07/31/2018 1800   HGBUR SMALL (A) 07/31/2018 1800   BILIRUBINUR NEGATIVE 07/31/2018 1800   KETONESUR NEGATIVE 07/31/2018 1800   PROTEINUR >=300 (A) 07/31/2018 1800   NITRITE NEGATIVE 07/31/2018 1800   LEUKOCYTESUR MODERATE (A) 07/31/2018 1800    Sepsis Labs: Lactic Acid, Venous No results found for: LATICACIDVEN   MICROBIOLOGY: Recent Results (from the past 240 hour(s))  SARS CORONAVIRUS 2 (TAT 6-24 HRS) Nasopharyngeal Nasopharyngeal Swab     Status: None   Collection Time: 07/08/19 10:04 PM   Specimen: Nasopharyngeal Swab  Result Value Ref Range Status   SARS Coronavirus 2 NEGATIVE NEGATIVE Final    Comment: (NOTE) SARS-CoV-2 target nucleic acids are NOT DETECTED. The SARS-CoV-2 RNA is generally detectable in upper and lower respiratory specimens during the acute phase of infection. Negative results do not preclude SARS-CoV-2 infection, do not rule out co-infections with other pathogens, and should not be used as the sole basis for treatment or other patient management decisions. Negative results must be combined with clinical observations, patient history, and epidemiological information. The expected result is Negative. Fact Sheet for Patients: SugarRoll.be Fact Sheet for Healthcare Providers: https://www.woods-mathews.com/ This test is not yet approved or cleared by the [place] FDA and  has been authorized for detection and/or diagnosis of SARS-CoV-2 by FDA under an Emergency Use Authorization (EUA). This EUA will remain  in effect (meaning this test can be used) for the duration of the COVID-19 declaration under Section 56 4(b)(1) of the Act, 21 U.S.C. section 360bbb-3(b)(1), unless the authorization is terminated or revoked sooner. Performed at [facility], [place] [address], [place], [place] [address]   Gram stain     Status: None   Collection Time: 07/09/19  8:55 AM   Specimen: Abdomen; Peritoneal Fluid  Result Value Ref Range Status   Specimen Description ABDOMEN  Final   Special Requests NONE  Final   Gram Stain   Final    WBC PRESENT,BOTH PMN AND MONONUCLEAR GRAM POSITIVE COCCI CYTOSPIN SMEAR Gram Stain Report Called to,Read Back By and Verified WithKym Groom RN 1700 07/09/19 A BROWNING Performed at [facility],  [place] [address] [place], [place] [address]    Report Status 07/09/2019 FINAL  Final  Culture, body fluid-bottle     Status: Abnormal   Collection Time: 07/09/19  8:55 AM   Specimen: Peritoneal Washings  Result Value Ref Range Status   Specimen Description PERITONEAL ABDOMEN  Final   Special Requests NONE  Final   Gram Stain   Final    GRAM POSITIVE COCCI IN CLUSTERS IN BOTH AEROBIC AND ANAEROBIC BOTTLES CRITICAL RESULT CALLED TO, READ BACK BY AND VERIFIED WITH: Manya Silvas [id number] 07/10/2019 Mena Goes Performed at [facility], [place] [address], [place], [place] [address]    Culture STAPHYLOCOCCUS EPIDERMIDIS (A)  Final   Report Status 07/12/2019 FINAL  Final   Organism ID, Bacteria STAPHYLOCOCCUS EPIDERMIDIS  Final      Susceptibility   Staphylococcus epidermidis - MIC*    CIPROFLOXACIN 4 RESISTANT Resistant     ERYTHROMYCIN >=8 RESISTANT Resistant     GENTAMICIN <=0.5 SENSITIVE Sensitive     OXACILLIN >=4 RESISTANT Resistant     TETRACYCLINE 2 SENSITIVE Sensitive     VANCOMYCIN 1 SENSITIVE Sensitive     TRIMETH/SULFA 20 SENSITIVE Sensitive     CLINDAMYCIN >=8 RESISTANT Resistant     RIFAMPIN <=0.5 SENSITIVE Sensitive     Inducible Clindamycin NEGATIVE  Sensitive     * STAPHYLOCOCCUS EPIDERMIDIS  C Difficile Quick Screen w PCR reflex     Status: Abnormal   Collection Time: 07/09/19  2:27 PM   Specimen: STOOL  Result Value Ref Range Status   C Diff antigen POSITIVE (A) NEGATIVE Final   C Diff toxin NEGATIVE NEGATIVE Final   C Diff interpretation Results are indeterminate. See PCR results.  Final    Comment: Performed at [facility], [place] [address], [place], [place] [address]  C. Diff by PCR, Reflexed     Status: Abnormal   Collection Time: 07/09/19  2:27 PM  Result Value Ref Range Status   Toxigenic C. Difficile by PCR POSITIVE (A) NEGATIVE Final    Comment: Positive for toxigenic C. difficile with little to no toxin production. Only treat if clinical presentation  suggests symptomatic illness. Performed at [facility], [place] [address], [place], [place] [address]   Body fluid culture     Status: None (Preliminary result)   Collection Time: 07/14/19  1:12 PM   Specimen: Peritoneal Dialysate; Body Fluid  Result Value Ref Range Status   Specimen Description PERITONEAL DIALYSATE  Final   Special Requests Immunocompromised  Final   Gram Stain NO WBC SEEN NO ORGANISMS SEEN   Final   Culture   Final    FEW STAPHYLOCOCCUS EPIDERMIDIS SUSCEPTIBILITIES TO FOLLOW Performed at [facility], [address], [place], [place] [address]    Report Status PENDING  Incomplete    RADIOLOGY STUDIES/RESULTS: No results found.   LOS: 7 days   Oren Binet, MD  [facility]    To contact the attending provider between 7A-7P or the covering provider during after hours 7P-7A, please log into the web site [email or web address] and access using universal [place] password for that web site. If you do not have the password, please call the hospital operator.  07/16/2019, 2:36 PM

## 2019-07-16 NOTE — Progress Notes (Signed)
Renal Navigator received notification from Dr. Schertz/Nephrologist that patient will be transitioning from PD to HD and needs at seat in her clinic. Renal Navigator sent message to her home clinic/[facility] and will follow up in the morning regarding a seat time.   Alphonzo Cruise, [place] Renal Navigator [phone]

## 2019-07-16 NOTE — H&P (Signed)
Chief Complaint: Patient was seen in consultation today for tunneled HD catheter placement.  Referring Physician(s): Roney Jaffe  Supervising Physician: Arne Cleveland  Patient Status: [facility] - In-pt  History of Present Illness: Janet Mitchell is a 54 y.o. female with a past medical history significant for anemia requiring previous transfusions, CHF, HTN, DM and ESRD on peritoneal dialysis who presented to the ED on 07/08/19 after being noted to have a hgb of 6.5 at her nephrologist's office (Dr. Abigail Butts) as well as new onset abdominal pain/swelling. She was transfused 1 unit of PRBCs and admitted for further evaluation and management. She underwent a diagnostic paracentesis in IR the following day which was notable for total nucleated cells 1,270, neutrophils 83 and staph epidermidis. She was also found to have PD catheter associated peritonitis with significant volume overload and low albumin - nephrology was consulted and plans to proceed with hemodialysis. IR has been asked to place a tunneled HD catheter for venous access.  Ms. Elnoria Howard Pinnix seen in her room, no family at bedside. She has a lunch tray but has not eaten any yet in case she is able to have procedure done today. She has never had hemodialysis before. She denies any complaints currently except that she wants to get everything done so she can leave the hospital. She understands the requested procedure and is eager to proceed.  Past Medical History:  Diagnosis Date  . Anemia   . CHF (congestive heart failure) ([place])   . Chronic cholecystitis with calculus   . COVID-19 virus infection 03/2019  . ESRD (end stage renal disease) on dialysis [facility])    "peritoneal dialysis q hs" (03/09/2018)  . Headache    "a few/wk" (03/09/2018)  . History of blood transfusion 10/2017   "low blood count" (03/09/2018)  . Hypertension   . Spinal headache   . Type II diabetes mellitus ([place])     Past Surgical History:  Procedure  Laterality Date  . AMPUTATION TOE Left 2013   Great toe  . BIOPSY  10/24/2018   Procedure: BIOPSY;  Surgeon: Daneil Dolin, MD;  [facility];  Service: Endoscopy;;  right and left colon  . CATARACT EXTRACTION W/ INTRAOCULAR LENS IMPLANT Right   . CESAREAN SECTION  1994; 1998  . CHOLECYSTECTOMY N/A 03/09/2018   Procedure: LAPAROSCOPIC CHOLECYSTECTOMY WITH INTRAOPERATIVE CHOLANGIOGRAM ERAS PATHWAY;  Surgeon: Donnie Mesa, MD;  Location: [place];  Service: General;  Laterality: N/A;  . COLONOSCOPY N/A 10/24/2018   Procedure: COLONOSCOPY;  Surgeon: Daneil Dolin, MD;  [facility];  Service: Endoscopy;  Laterality: N/A;  1:00pm  . EYE SURGERY  05/15/2018   Removal of blood in the globe (due to DM)  . FLEXIBLE SIGMOIDOSCOPY N/A 11/24/2017   Procedure: FLEXIBLE SIGMOIDOSCOPY;  Surgeon: Daneil Dolin, MD;  [facility];  Service: Endoscopy;  Laterality: N/A;  . IR PARACENTESIS  07/09/2019  . LAPAROSCOPIC CHOLECYSTECTOMY  03/09/2018  . PERITONEAL CATHETER INSERTION  2017  . POLYPECTOMY  10/24/2018   Procedure: POLYPECTOMY;  Surgeon: Daneil Dolin, MD;  [facility];  Service: Endoscopy;;  . TOTAL HIP ARTHROPLASTY Left 1997    Allergies: Patient has no known allergies.  Medications: Prior to Admission medications   Medication Sig Start Date End Date Taking? Authorizing Provider  acetaminophen (TYLENOL) 325 MG tablet Take 650 mg by mouth every 6 (six) hours as needed (pain).   Yes [provider]  aspirin EC 81 MG tablet Take  81 mg by mouth daily.   Yes [provider]  calcitRIOL (ROCALTROL) 0.5 MCG capsule Take 0.5-1 mcg by mouth See admin instructions. 0.48mg on Mondays through Fridays and take 126m on Saturdays and Sundays   Yes [provider]  calcium acetate (PHOSLO) 667 MG capsule Take 2 capsules (1,334 mg total) by mouth 3 (three) times daily with meals. 04/11/19  Yes GrNicole Kindred, DO  doxazosin (CARDURA) 8  MG tablet Take 8 mg by mouth daily.  05/06/19  Yes [provider]  insulin glargine, 2 Unit Dial, (TOUJEO MAX SOLOSTAR) 300 UNIT/ML Solostar Pen Inject 20 Units into the skin daily.   Yes [provider]  labetalol (NORMODYNE) 300 MG tablet Take 300 mg by mouth 2 (two) times daily.  05/06/19  Yes [provider]  losartan (COZAAR) 100 MG tablet Take 100 mg by mouth daily.  05/06/19  Yes [provider]  multivitamin (RENA-VIT) TABS tablet Take 1 tablet by mouth at bedtime. 04/12/19  Yes GrNicole Kindred, DO  Blood Glucose Monitoring Suppl (ACCU-CHEK GUIDE) w/Device KIT 1 Piece by Does not apply route as directed. 02/28/19   NiCassandria AngerMD  cholestyramine (QUESTRAN) 4 g packet Take 1 packet (4 g total) by mouth 2 (two) times daily. Patient not taking: Reported on 07/08/2019 04/11/19   GrNicole Kindred, DO  glucose blood (ACCU-CHEK GUIDE) test strip Use as instructed 02/28/19   NiCassandria AngerMD  hyoscyamine (LEVBID) 0.375 MG 12 hr tablet Take 1 tablet (0.375 mg total) by mouth every 12 (twelve) hours. Patient not taking: Reported on 07/08/2019 04/13/19   GrNicole Kindred, DO  Insulin Glargine, 2 Unit Dial, 300 UNIT/ML SOPN Inject 20 Units into the skin at bedtime. Patient not taking: Reported on 07/08/2019 02/28/19   NiCassandria AngerMD  Ipratropium-Albuterol (COMBIVENT) 20-100 MCG/ACT AERS respimat Inhale 2 puffs into the lungs every 6 (six) hours as needed for wheezing or shortness of breath. Patient not taking: Reported on 07/08/2019 03/30/19   LaOswald HillockMD  pantoprazole (PROTONIX) 40 MG tablet Take 1 tablet (40 mg total) by mouth daily. Patient not taking: Reported on 07/08/2019 04/11/19 07/08/19  GrNicole Kindred, DO  potassium chloride SA (KLOR-CON) 20 MEQ tablet Take 2 tablets (40 mEq total) by mouth daily for 3 days. Patient not taking: Reported on 07/08/2019 04/13/19 07/08/19  GrEzekiel SlocumbDO     Family History  Problem Relation Age  of Onset  . Heart disease Mother   . Thrombocytopenia Mother        TTP  . Heart failure Father   . Kidney disease Paternal Grandfather   . Colon cancer Neg Hx     Social History   Socioeconomic History  . Marital status: Married    Spouse name: Not on file  . Number of children: 2  . Years of education: Not on file  . Highest education level: Not on file  Occupational History  . Not on file  Tobacco Use  . Smoking status: Passive Smoke Exposure - Never Smoker  . Smokeless tobacco: Never Used  Substance and Sexual Activity  . Alcohol use: Never  . Drug use: Never  . Sexual activity: Yes    Birth control/protection: None  Other Topics Concern  . Not on file  Social History Narrative  . Not on file   Social Determinants of Health   Financial Resource Strain:   . Difficulty of Paying Living Expenses:   Food  Insecurity:   . Worried About Charity fundraiser in the Last Year:   . Arboriculturist in the Last Year:   Transportation Needs:   . Film/video editor (Medical):   Marland Kitchen Lack of Transportation (Non-Medical):   Physical Activity:   . Days of Exercise per Week:   . Minutes of Exercise per Session:   Stress:   . Feeling of Stress :   Social Connections:   . Frequency of Communication with Friends and Family:   . Frequency of Social Gatherings with Friends and Family:   . Attends Religious Services:   . Active Member of Clubs or Organizations:   . Attends Archivist Meetings:   Marland Kitchen Marital Status:      Review of Systems: A 12 point ROS discussed and pertinent positives are indicated in the HPI above.  All other systems are negative.  Review of Systems  Constitutional: Negative for chills and fever.  Respiratory: Negative for cough and shortness of breath.   Cardiovascular: Negative for chest pain.  Gastrointestinal: Negative for abdominal pain, nausea and vomiting.  Musculoskeletal: Negative for back pain.    Vital Signs: BP (!) 165/84 (BP  Location: Left Arm)   Pulse 69   Temp 98.2 F (36.8 C) (Axillary)   Resp 20   Wt 284 lb 2.8 oz (128.9 kg)   SpO2 93%   BMI 43.21 kg/m   Physical Exam Vitals and nursing note reviewed.  Constitutional:      General: She is not in acute distress.    Appearance: She is obese.  HENT:     Head: Normocephalic.     Mouth/Throat:     Mouth: Mucous membranes are moist.     Pharynx: Oropharynx is clear. No oropharyngeal exudate or posterior oropharyngeal erythema.  Cardiovascular:     Rate and Rhythm: Normal rate and regular rhythm.  Pulmonary:     Effort: Pulmonary effort is normal.     Breath sounds: Normal breath sounds.  Abdominal:     Palpations: Abdomen is soft.  Skin:    General: Skin is warm and dry.  Neurological:     Mental Status: She is alert and oriented to person, place, and time.  Psychiatric:        Mood and Affect: Mood normal.        Behavior: Behavior normal.        Thought Content: Thought content normal.        Judgment: Judgment normal.      MD Evaluation Airway: WNL Heart: WNL Abdomen: WNL((+) PD catheter) Chest/ Lungs: WNL ASA  Classification: 2 Mallampati/Airway Score: Two   Imaging: CT ABDOMEN PELVIS WO CONTRAST  Result Date: 07/09/2019 CLINICAL DATA:  Abdomen pain EXAM: CT ABDOMEN AND PELVIS WITHOUT CONTRAST TECHNIQUE: Multidetector CT imaging of the abdomen and pelvis was performed following the standard protocol without IV contrast. COMPARISON:  05/27/2019, 04/08/2019 FINDINGS: Lower chest: Lung bases demonstrate heterogeneous mostly peripheral opacities likely scarring from prior COVID pneumonia. No consolidation or pleural effusion. Coronary vascular calcification. Mild cardiomegaly. Hepatobiliary: No focal liver abnormality is seen. Status post cholecystectomy. No biliary dilatation. Pancreas: Unremarkable. No pancreatic ductal dilatation or surrounding inflammatory changes. Spleen: Normal in size without focal abnormality. Adrenals/Urinary  Tract: Adrenal glands are slightly thickened but without dominant mass. Atrophic kidneys with extensive vascular calcification. No hydronephrosis. Unremarkable urinary bladder Stomach/Bowel: Stomach is within normal limits. Appendix appears normal. No evidence of bowel wall thickening, distention, or inflammatory changes. Vascular/Lymphatic: Extensive  aortic atherosclerosis. No aneurysm. No suspicious adenopathy Reproductive: IUD in the uterus Other: No free air. Moderate to large loculated ascites within the anterior abdominal cavity. Peritoneal dialysis catheter coiled within the right anterior abdomen. Musculoskeletal: No acute or suspicious osseous abnormality. IMPRESSION: 1. Moderate to large volume of loculated appearing ascites within the anterior abdominal cavity 2. Heterogenous peripheral densities at the bases felt secondary to prior COVID pneumonia and scarring 3. Cardiomegaly.  Anasarca.  Atrophic native kidneys Electronically Signed   By: Donavan Foil M.D.   On: 07/09/2019 03:22   DG Chest Port 1 View  Result Date: 07/09/2019 CLINICAL DATA:  Short of breath EXAM: PORTABLE CHEST 1 VIEW COMPARISON:  04/02/2019 FINDINGS: Low lung volumes. Cardiomegaly. Diffuse bilateral reticular and ground-glass opacity. No pleural effusion or pneumothorax. IMPRESSION: Cardiomegaly with mild diffuse fine reticular and ground-glass opacity similar compared to prior, question underlying chronic interstitial lung disease. Electronically Signed   By: Donavan Foil M.D.   On: 07/09/2019 00:44   ECHOCARDIOGRAM COMPLETE  Result Date: 07/09/2019    ECHOCARDIOGRAM REPORT   Patient Name:   [facility] Date of Exam: 07/09/2019 Medical Rec #:  [id number]          Height:       68.0 in Accession #:    [id number]         Weight:       250.7 lb Date of Birth:  [birth year]           BSA:          2.250 m Patient Age:    17 years           BP:           161/73 mmHg Patient Gender: F                  HR:           76 bpm. Exam  Location:  Inpatient Procedure: 2D Echo, Cardiac Doppler and Color Doppler Indications:    I50.23 Acute on chronic systolic (congestive) heart failure  History:        Patient has no prior history of Echocardiogram examinations.                 Risk Factors:Hypertension and Diabetes. ESRD. COVID-19.  Sonographer:    Jonelle Sidle Dance Referring Phys: [id number] [place]  1. Left ventricular ejection fraction, by estimation, is 50 to 55%. The left ventricle has normal function. The left ventricle demonstrates regional wall motion abnormalities (see scoring diagram/findings for description). There is moderate concentric left ventricular hypertrophy. Left ventricular diastolic parameters are consistent with Grade II diastolic dysfunction (pseudonormalization). Elevated left atrial pressure. There is mild hypokinesis of the left ventricular, entire inferolateral wall.  2. Right ventricular systolic function is mildly reduced. The right ventricular size is normal. There is moderately elevated pulmonary artery systolic pressure.  3. Left atrial size was moderately dilated.  4. Right atrial size was mildly dilated.  5. The mitral valve is normal in structure. Trivial mitral valve regurgitation.  6. Tricuspid valve regurgitation is mild to moderate.  7. The aortic valve is normal in structure. Aortic valve regurgitation is trivial.  8. The inferior vena cava is dilated in size with >50% respiratory variability, suggesting right atrial pressure of 8 mmHg. FINDINGS  Left Ventricle: Left ventricular ejection fraction, by estimation, is 50 to 55%. The left ventricle has normal function. The left ventricle demonstrates regional wall motion abnormalities. Mild hypokinesis  of the left ventricular, entire inferolateral wall. The left ventricular internal cavity size was normal in size. There is moderate concentric left ventricular hypertrophy. Left ventricular diastolic parameters are consistent with Grade II diastolic  dysfunction (pseudonormalization). Elevated left atrial pressure. Right Ventricle: The right ventricular size is normal. No increase in right ventricular wall thickness. Right ventricular systolic function is mildly reduced. There is moderately elevated pulmonary artery systolic pressure. The tricuspid regurgitant velocity is 2.91 m/s, and with an assumed right atrial pressure of 8 mmHg, the estimated right ventricular systolic pressure is 64.1 mmHg. Left Atrium: Left atrial size was moderately dilated. Right Atrium: Right atrial size was mildly dilated. Pericardium: There is no evidence of pericardial effusion. Mitral Valve: The mitral valve is normal in structure. Mild mitral annular calcification. Trivial mitral valve regurgitation. Tricuspid Valve: The tricuspid valve is normal in structure. Tricuspid valve regurgitation is mild to moderate. Aortic Valve: The aortic valve is normal in structure. Aortic valve regurgitation is trivial. Pulmonic Valve: The pulmonic valve was normal in structure. Pulmonic valve regurgitation is not visualized. Aorta: The aortic root and ascending aorta are structurally normal, with no evidence of dilitation. Venous: The inferior vena cava is dilated in size with greater than 50% respiratory variability, suggesting right atrial pressure of 8 mmHg. IAS/Shunts: No atrial level shunt detected by color flow Doppler.  LEFT VENTRICLE PLAX 2D LVIDd:         5.30 cm  Diastology LVIDs:         3.70 cm  LV e' lateral:   8.05 cm/s LV PW:         1.50 cm  LV E/e' lateral: 16.8 LV IVS:        1.30 cm  LV e' medial:    5.22 cm/s LVOT diam:     1.90 cm  LV E/e' medial:  25.9 LV SV:         57 LV SV Index:   25 LVOT Area:     2.84 cm  RIGHT VENTRICLE            IVC RV Basal diam:  3.50 cm    IVC diam: 2.20 cm RV Mid diam:    2.10 cm RV S prime:     9.03 cm/s TAPSE (M-mode): 2.0 cm LEFT ATRIUM              Index       RIGHT ATRIUM           Index LA diam:        4.80 cm  2.13 cm/m  RA Area:      19.00 cm LA Vol (A2C):   107.0 ml 47.56 ml/m RA Volume:   54.50 ml  24.23 ml/m LA Vol (A4C):   77.4 ml  34.40 ml/m LA Biplane Vol: 94.9 ml  42.18 ml/m  AORTIC VALVE LVOT Vmax:   83.00 cm/s LVOT Vmean:  61.400 cm/s LVOT VTI:    0.200 m  AORTA Ao Root diam: 3.10 cm Ao Asc diam:  3.15 cm MITRAL VALVE                TRICUSPID VALVE MV Area (PHT): 4.21 cm     TR Peak grad:   33.9 mmHg MV Decel Time: 180 msec     TR Vmax:        291.00 cm/s MV E velocity: 135.00 cm/s MV A velocity: 79.60 cm/s   SHUNTS MV E/A ratio:  1.70  Systemic VTI:  0.20 m                             Systemic Diam: 1.90 cm Sanda Klein MD Electronically signed by Sanda Klein MD Signature Date/Time: 07/09/2019/1:54:09 PM    Final    IR Paracentesis  Result Date: 07/09/2019 INDICATION: Patient with history of end stage renal disease admitted with abdominal pain and ascites presents for diagnostic paracentesis maximum 100 ml per Team's request. EXAM: ULTRASOUND GUIDED DIAGNOSTIC PARACENTESIS MEDICATIONS: Lidocaine 1% 10 mL COMPLICATIONS: None immediate. PROCEDURE: Informed written consent was obtained from the patient after a discussion of the risks, benefits and alternatives to treatment. A timeout was performed prior to the initiation of the procedure. Initial ultrasound scanning demonstrates a large amount of ascites within the right lower abdominal quadrant. The right lower abdomen was prepped and draped in the usual sterile fashion. 1% lidocaine was used for local anesthesia. Following this, a 19 gauge, 10-cm, Yueh catheter was introduced. An ultrasound image was saved for documentation purposes. The paracentesis was performed. The catheter was removed and a dressing was applied. The patient tolerated the procedure well without immediate post procedural complication. FINDINGS: A total of approximately 100 m of clear fluid was removed. Samples were sent to the laboratory as requested by the clinical team. IMPRESSION: Successful  ultrasound-guided therapeutic paracentesis yielding 100 mililiters of peritoneal fluid. Read by Rushie Nyhan NP Electronically Signed   By: Markus Daft M.D.   On: 07/09/2019 09:12    Labs:  CBC: Recent Labs    07/12/19 0345 07/13/19 0902 07/14/19 0553 07/15/19 0413  WBC 7.7 8.0 6.1 6.7  HGB 7.3* 7.5* 7.8* 7.4*  HCT 24.9* 24.8* 26.0* 24.0*  PLT 187 175 185 190    COAGS: No results for input(s): INR, APTT in the last 8760 hours.  BMP: Recent Labs    07/13/19 0902 07/14/19 0553 07/15/19 0413 07/16/19 0353  NA 134* 132* 133* 133*  K 3.7 3.6 3.4* 3.4*  CL 92* 90* 89* 91*  CO2 21* '22 22 24  ' GLUCOSE 114* 122* 113* 165*  BUN 67* 65* 63* 62*  CALCIUM 9.1 9.1 9.1 9.1  CREATININE 12.23* 12.60* 12.57* 12.37*  GFRNONAA 3* 3* 3* 3*  GFRAA 4* 4* 4* 4*    LIVER FUNCTION TESTS: Recent Labs    04/02/19 1714 04/04/19 0535 04/13/19 0611 04/13/19 0611 07/08/19 1959 07/08/19 1959 07/10/19 0419 07/11/19 0358 07/13/19 0902 07/14/19 0553 07/15/19 0413 07/16/19 0353  BILITOT 0.5  --  0.6  --  0.8  --  0.8  --   --   --   --   --   AST 12*  --  7*  --  11*  --  11*  --   --   --   --   --   ALT 9  --  6  --  13  --  10  --   --   --   --   --   ALKPHOS 78  --  66  --  57  --  47  --   --   --   --   --   PROT 6.3*  --  5.0*  --  7.5  --  7.0  --   --   --   --   --   ALBUMIN 2.2*   < > 1.6*   < > 2.3*   < > 2.1*   < >  2.3* 2.3* 2.1* 2.1*   < > = values in this interval not displayed.    TUMOR MARKERS: No results for input(s): AFPTM, CEA, CA199, CHROMGRNA in the last 8760 hours.  Assessment and Plan:  54 y/o F with ESRD previously on PD now with plans to transition to HD due to recent PD failure. IR has been asked to place a tunneled HD catheter for venous access.  Plan to proceed with tunneled HD catheter placement tomorrow (4/21) in IR pending any emergent procedures. Patient to be NPO at midnight, SQ heparin to be held after 2200 dose tonight, IR will call for  patient when ready.  Risks and benefits discussed with the patient including, but not limited to bleeding, infection, vascular injury, pneumothorax which may require chest tube placement, air embolism or even death.  All of the patient's questions were answered, patient is agreeable to proceed.  Consent signed and in IR control room.  Thank you for this interesting consult.  I greatly enjoyed meeting Deserea Bordley Pinnix and look forward to participating in their care.  A copy of this report was sent to the requesting provider on this date.  Electronically Signed: Joaquim Nam, PA-C 07/16/2019, 1:48 PM   I spent a total of 20 Minutes in face to face in clinical consultation, greater than 50% of which was counseling/coordinating care for tunneled HD catheter placement.

## 2019-07-16 NOTE — Progress Notes (Signed)
Pharmacy Antibiotic Note  Janet Mitchell is a 54 y.o. female admitted on 07/08/2019 with peritonitis.  Pharmacy has been consulted for Vancomycin dosing.   Staph epidermidis in culture (oxacillin resistant)   Intermittent Vanc dosing:  2 gm IV given on 4/13.   - random level 10 mcg/ml on 4/16 > 2 gm IV repeated.   - random level 31 mcg/ml on 4/17   - estimate vanc level remains adequate.    Planning switch from [facility] to HD.  For [facility] on 4/21.  Plan:  Re-dose when Vanc level < 15 mcg/ml  Will plan to re-dose Vanc after hemodialysis.  Follow up [facility] placement and HD plans.  Height: 68 inches Ideal body weight: 63.9 kg Weight: 128.9 kg (284 lb 2.8 oz)  Temp (24hrs), Avg:98 F (36.7 C), Min:97.8 F (36.6 C), Max:98.2 F (36.8 C)  Recent Labs  Lab 07/11/19 0358 07/11/19 0358 07/12/19 0345 07/13/19 0902 07/14/19 0553 07/15/19 0413 07/16/19 0353  WBC 6.7  --  7.7 8.0 6.1 6.7  --   CREATININE 11.60*   < > 12.07* 12.23* 12.60* 12.57* 12.37*  VANCORANDOM  --   --  10 31  --   --   --    < > = values in this interval not displayed.     No Known Allergies  Antimicrobials this admission: Vancomycin 4/13 >>  Ceftazidime x 1 on 4/13 Ceftriaxone 4/13>>4/15 (got 2 doses) Vancomycin oral 4/13>>4/14  Microbiology results: 4/13: Peritoneal washings: Staph epi - R oxa, cipro, clinda  - sens Gent MIC<0.5, TCN, Vanc MIC 1, Septra 4/18: peritoneal dialysate: rare Staph epi - reincubated, sens pending 4/13: Cdiff: POSITIVE 4/12: COVID negative  Thank you for allowing pharmacy to be a part of this patient's care.  Arty Baumgartner, [place] Phone: [phone] 07/16/2019 5:28 PM

## 2019-07-16 NOTE — Plan of Care (Signed)
Plan of care reviewed with pt at bedside. PD complete. Pt c/o nausea, medications given per orders.  Pt stable at this time, will continue to monitor. Call bell in reach.  Problem: Education: Goal: Knowledge of General Education information will improve Description: Including pain rating scale, medication(s)/side effects and non-pharmacologic comfort measures Outcome: Progressing   Problem: Health Behavior/Discharge Planning: Goal: Ability to manage health-related needs will improve Outcome: Progressing   Problem: Clinical Measurements: Goal: Ability to maintain clinical measurements within normal limits will improve Outcome: Progressing Goal: Will remain free from infection Outcome: Progressing Goal: Diagnostic test results will improve Outcome: Progressing Goal: Respiratory complications will improve Outcome: Progressing Goal: Cardiovascular complication will be avoided Outcome: Progressing   Problem: Activity: Goal: Risk for activity intolerance will decrease Outcome: Progressing   Problem: Nutrition: Goal: Adequate nutrition will be maintained Outcome: Progressing   Problem: Coping: Goal: Level of anxiety will decrease Outcome: Progressing   Problem: Elimination: Goal: Will not experience complications related to bowel motility Outcome: Progressing Goal: Will not experience complications related to urinary retention Outcome: Progressing   Problem: Pain Managment: Goal: General experience of comfort will improve Outcome: Progressing   Problem: Safety: Goal: Ability to remain free from injury will improve Outcome: Progressing   Problem: Skin Integrity: Goal: Risk for impaired skin integrity will decrease Outcome: Progressing   Problem: Education: Goal: Knowledge of disease and its progression will improve Outcome: Progressing Goal: Individualized Educational Video(s) Outcome: Progressing   Problem: Fluid Volume: Goal: Compliance with measures to maintain  balanced fluid volume will improve Outcome: Progressing   Problem: Health Behavior/Discharge Planning: Goal: Ability to manage health-related needs will improve Outcome: Progressing   Problem: Nutritional: Goal: Ability to make healthy dietary choices will improve Outcome: Progressing   Problem: Clinical Measurements: Goal: Complications related to the disease process, condition or treatment will be avoided or minimized Outcome: Progressing

## 2019-07-17 ENCOUNTER — Inpatient Hospital Stay (HOSPITAL_COMMUNITY): Payer: Medicare Other

## 2019-07-17 ENCOUNTER — Inpatient Hospital Stay (HOSPITAL_COMMUNITY): Payer: Medicare Other | Admitting: Anesthesiology

## 2019-07-17 ENCOUNTER — Encounter (HOSPITAL_COMMUNITY): Payer: Medicare Other

## 2019-07-17 ENCOUNTER — Encounter (HOSPITAL_COMMUNITY)
Admission: EM | Disposition: A | Discharge status: Home Health | Payer: Self-pay | Source: Ambulatory Visit | Attending: Internal Medicine

## 2019-07-17 ENCOUNTER — Encounter (HOSPITAL_COMMUNITY): Payer: Self-pay | Admitting: Family Medicine

## 2019-07-17 DIAGNOSIS — N185 Chronic kidney disease, stage 5: Secondary | ICD-10-CM

## 2019-07-17 HISTORY — PX: IR US GUIDE VASC ACCESS RIGHT: IMG2390

## 2019-07-17 HISTORY — PX: IR FLUORO GUIDE CV LINE RIGHT: IMG2283

## 2019-07-17 LAB — RENAL FUNCTION PANEL
Albumin: 2.1 g/dL — ABNORMAL LOW (ref 3.5–5.0)
Anion gap: 16 — ABNORMAL HIGH (ref 5–15)
BUN: 60 mg/dL — ABNORMAL HIGH (ref 6–20)
CO2: 23 mmol/L (ref 22–32)
Calcium: 9.3 mg/dL (ref 8.9–10.3)
Chloride: 94 mmol/L — ABNORMAL LOW (ref 98–111)
Creatinine, Ser: 12.56 mg/dL — ABNORMAL HIGH (ref 0.44–1.00)
GFR calc Af Amer: 4 mL/min — ABNORMAL LOW (ref >60)
GFR calc non Af Amer: 3 mL/min — ABNORMAL LOW (ref >60)
Glucose, Bld: 165 mg/dL — ABNORMAL HIGH (ref 70–99)
Phosphorus: 6.3 mg/dL — ABNORMAL HIGH (ref 2.5–4.6)
Potassium: 3.5 mmol/L (ref 3.5–5.1)
Sodium: 133 mmol/L — ABNORMAL LOW (ref 135–145)

## 2019-07-17 LAB — BODY FLUID CULTURE: Gram Stain: NONE SEEN

## 2019-07-17 LAB — GLUCOSE, CAPILLARY
Glucose-Capillary: 137 mg/dL — ABNORMAL HIGH (ref 70–99)
Glucose-Capillary: 124 mg/dL — ABNORMAL HIGH (ref 70–99)
Glucose-Capillary: 137 mg/dL — ABNORMAL HIGH (ref 70–99)
Glucose-Capillary: 77 mg/dL (ref 70–99)

## 2019-07-17 LAB — CBC
HCT: 24.5 % — ABNORMAL LOW (ref 36.0–46.0)
Hemoglobin: 7.4 g/dL — ABNORMAL LOW (ref 12.0–15.0)
MCH: 22.8 pg — ABNORMAL LOW (ref 26.0–34.0)
MCHC: 30.2 g/dL (ref 30.0–36.0)
MCV: 75.4 fL — ABNORMAL LOW (ref 80.0–100.0)
Platelets: 190 10*3/uL (ref 150–400)
RBC: 3.25 MIL/uL — ABNORMAL LOW (ref 3.87–5.11)
RDW: 20.4 % — ABNORMAL HIGH (ref 11.5–15.5)
WBC: 8.7 10*3/uL (ref 4.0–10.5)
nRBC: 0 % (ref 0.0–0.2)

## 2019-07-17 LAB — SURGICAL PCR SCREEN
MRSA, PCR: NEGATIVE
Staphylococcus aureus: NEGATIVE

## 2019-07-17 IMAGING — XA IR FLUORO GUIDE CV LINE*R*
2 series · 5 of 5 positions shown · non-contrast
Comparison: None

CLINICAL DATA: End-stage renal disease. Recent removal of
peritoneal dialysis catheter. Hemodialysis access required

EXAM:
TUNNELED HEMODIALYSIS CATHETER PLACEMENT WITH ULTRASOUND AND
FLUOROSCOPIC GUIDANCE
TECHNIQUE: The procedure, risks, benefits, and alternatives were explained to
the patient. Questions regarding the procedure were encouraged and
answered. The patient understands and consents to the procedure.

[Series 1: ir fluoro guide cv line*right* · 3 of 3 slices shown]
[im 1/3]
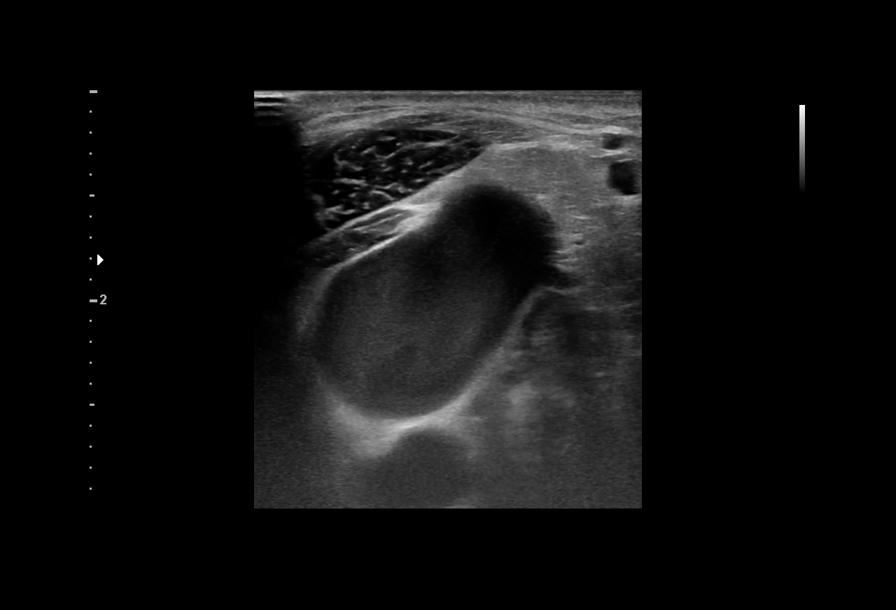
[im 2/3]
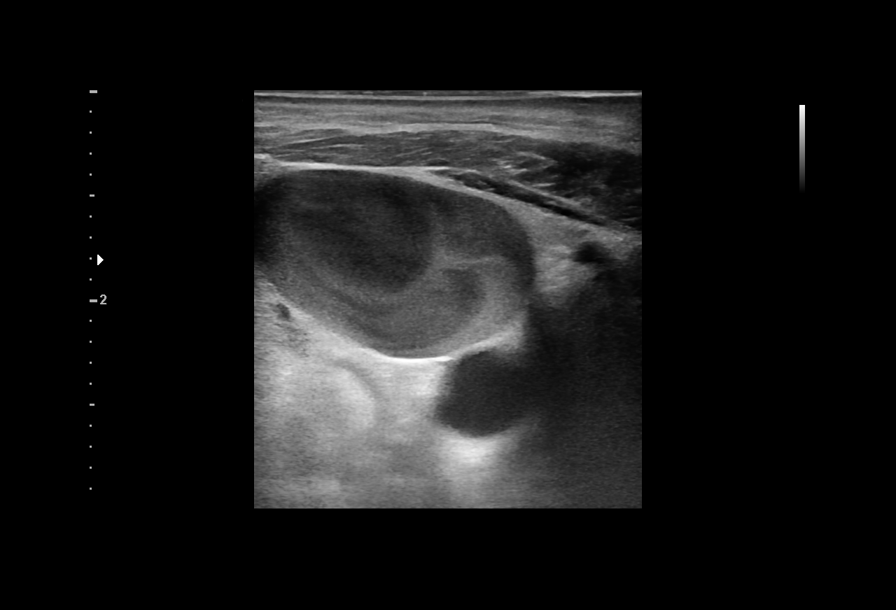
[im 3/3]
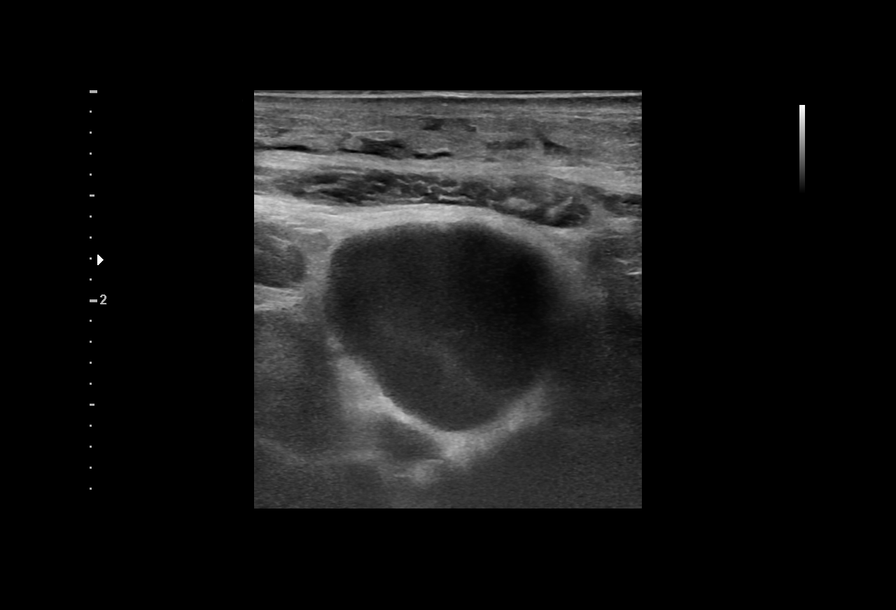

[Series 1: fl(-)  angio sharp · 2 of 2 slices shown]
[im 1/2]
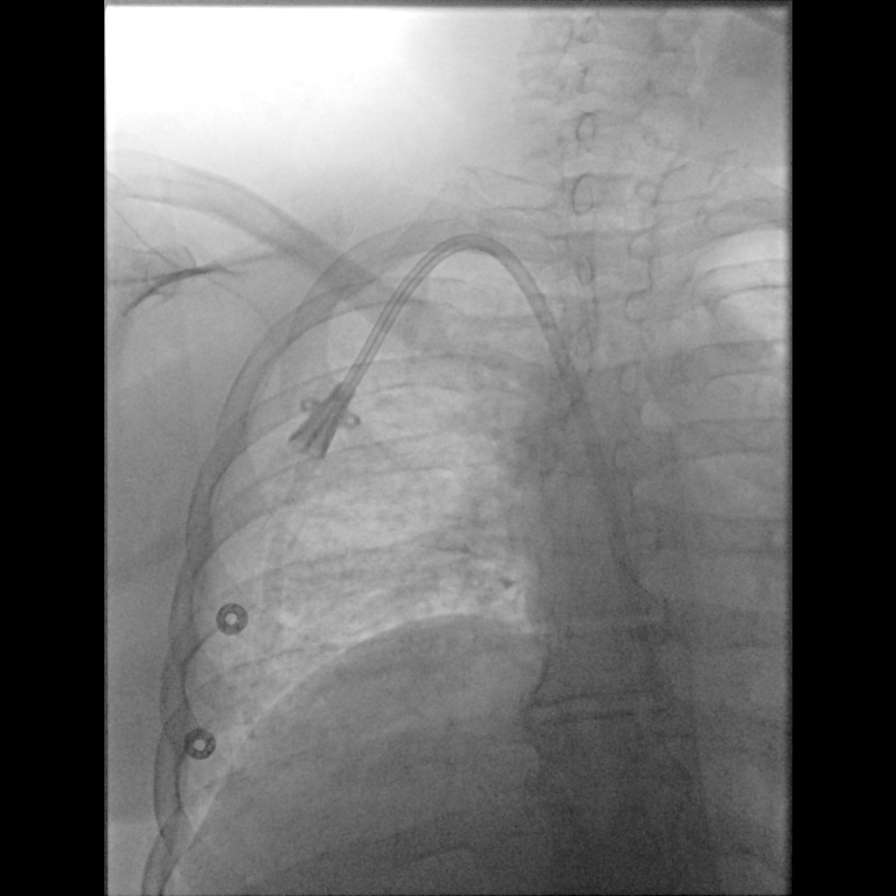
[im 2/2]
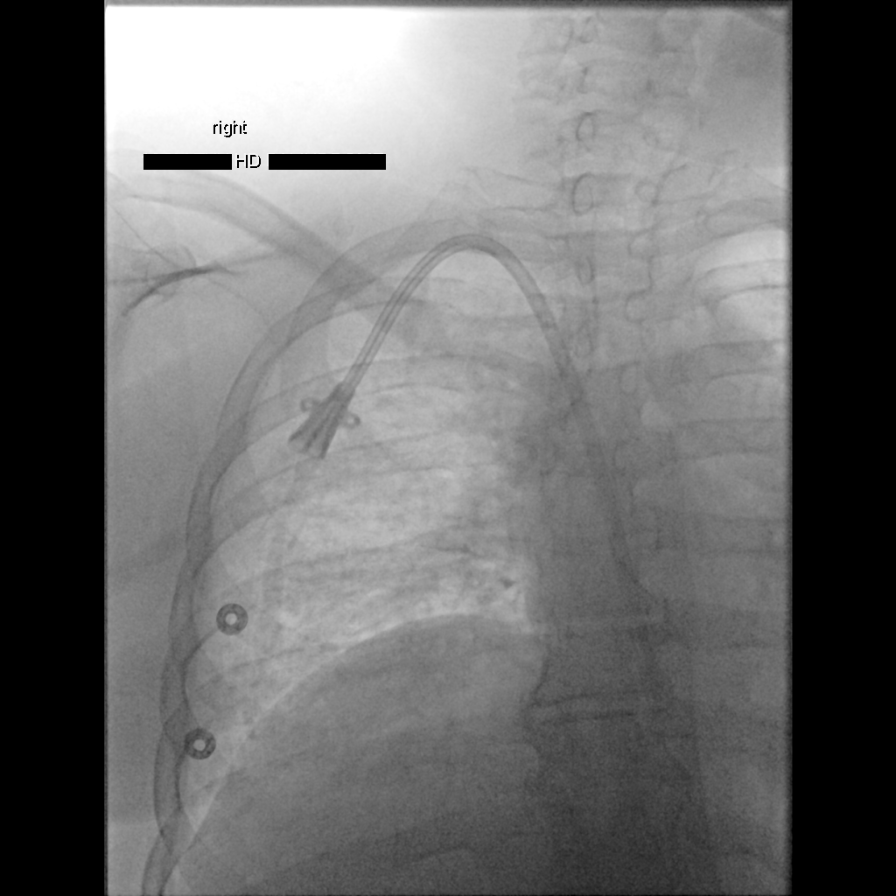

[5 of 5 positions shown; findings below may reference images not displayed]

As antibiotic prophylaxis, cefazolin 3 g was ordered pre-procedure
and administered intravenously within one hour of incision.Patency
of the right IJ vein was confirmed with ultrasound with image
documentation. An appropriate skin site was determined. Region was
prepped using maximum barrier technique including cap and mask,
sterile gown, sterile gloves, large sterile sheet, and Chlorhexidine
as cutaneous antisepsis. The region was infiltrated locally with 1%
lidocaine.

Intravenous Fentanyl [NE] and Versed 1mg were administered as
conscious sedation during continuous monitoring of the patient's
level of consciousness and physiological / cardiorespiratory status
by the radiology RN, with a total moderate sedation time of 15
minutes. Under real-time ultrasound guidance, the right IJ vein was
accessed with a 21 gauge micropuncture needle; the needle tip within
the vein was confirmed with ultrasound image documentation. Needle
exchanged over the 018 guidewire for transitional dilator, which
allowed advancement of a Benson wire into the IVC. Over this, an MPA
catheter was advanced. A Palindrome 19 hemodialysis catheter was
tunneled from the right anterior chest wall approach to the right IJ
dermatotomy site. The MPA catheter was exchanged over an Amplatz
wire for serial vascular dilators which allow placement of a
peel-away sheath, through which the catheter was advanced under
intermittent fluoroscopy, positioned with its tips in the proximal
and midright atrium. Spot chest radiograph confirms good catheter
position. No pneumothorax. Catheter was flushed and primed per
protocol. Catheter secured externally with O Prolene sutures. The
right IJ dermatotomy site was closed with Dermabond.

COMPLICATIONS:
COMPLICATIONS
None immediate

FLUOROSCOPY TIME:  0.5 minutes; 70 [NE] DAP
IMPRESSION: 1. Technically successful placement of tunneled right IJ
hemodialysis catheter with ultrasound and fluoroscopic guidance.
Ready for routine use.

ACCESS:
Remains approachable for percutaneous intervention as needed.

## 2019-07-17 SURGERY — INSERTION OF DIALYSIS CATHETER
Anesthesia: General

## 2019-07-17 MED ORDER — CHLORHEXIDINE GLUCONATE CLOTH 2 % EX PADS
6.0000 | MEDICATED_PAD | Freq: Every day | CUTANEOUS | Status: DC
  Administered 2019-07-19: 6 via TOPICAL

## 2019-07-17 MED ORDER — CHLORHEXIDINE GLUCONATE CLOTH 2 % EX PADS
6.0000 | MEDICATED_PAD | Freq: Every day | CUTANEOUS | Status: DC
  Administered 2019-07-18 – 2019-07-19 (×2): 6 via TOPICAL

## 2019-07-17 MED ORDER — SODIUM CHLORIDE 0.9 % IV SOLN
INTRAVENOUS | Status: AC
  Filled 2019-07-17: qty 1.2

## 2019-07-17 MED ORDER — LIDOCAINE-EPINEPHRINE (PF) 1 %-1:200000 IJ SOLN
INTRAMUSCULAR | Status: AC | PRN
  Administered 2019-07-17: 10 mL

## 2019-07-17 MED ORDER — LIDOCAINE-EPINEPHRINE 1 %-1:100000 IJ SOLN
INTRAMUSCULAR | Status: AC
  Filled 2019-07-17: qty 1

## 2019-07-17 MED ORDER — HEPARIN SODIUM (PORCINE) 1000 UNIT/ML IJ SOLN
INTRAMUSCULAR | Status: AC
  Filled 2019-07-17: qty 1

## 2019-07-17 MED ORDER — MIDAZOLAM HCL 2 MG/2ML IJ SOLN
INTRAMUSCULAR | Status: AC
  Filled 2019-07-17: qty 2

## 2019-07-17 MED ORDER — FENTANYL CITRATE (PF) 100 MCG/2ML IJ SOLN
INTRAMUSCULAR | Status: AC
  Filled 2019-07-17: qty 4

## 2019-07-17 MED ORDER — SODIUM CHLORIDE 0.9 % IV SOLN
INTRAVENOUS | Status: DC | PRN
  Administered 2019-07-17: 250 mL via INTRAVENOUS

## 2019-07-17 MED ORDER — PROPOFOL 10 MG/ML IV BOLUS
INTRAVENOUS | Status: AC
  Filled 2019-07-17: qty 20

## 2019-07-17 MED ORDER — DEXTROSE 5 % IV SOLN
3.0000 g | INTRAVENOUS | Status: AC
  Administered 2019-07-17: 3 g via INTRAVENOUS
  Filled 2019-07-17 (×2): qty 3000

## 2019-07-17 MED ORDER — FENTANYL CITRATE (PF) 250 MCG/5ML IJ SOLN
INTRAMUSCULAR | Status: AC
  Filled 2019-07-17: qty 5

## 2019-07-17 MED ORDER — HEPARIN SODIUM (PORCINE) 1000 UNIT/ML IJ SOLN
INTRAMUSCULAR | Status: AC
  Filled 2019-07-17: qty 4

## 2019-07-17 MED ORDER — MUPIROCIN 2 % EX OINT: 1.0000 "application " | TOPICAL_OINTMENT | Freq: Two times a day (BID) | CUTANEOUS | Status: DC

## 2019-07-17 MED ORDER — SODIUM CHLORIDE 0.9 % IV SOLN: 1.5000 g | INTRAVENOUS | Status: AC

## 2019-07-17 MED ORDER — MIDAZOLAM HCL 2 MG/2ML IJ SOLN
INTRAMUSCULAR | Status: AC | PRN
  Administered 2019-07-17: 1 mg via INTRAVENOUS

## 2019-07-17 MED ORDER — VANCOMYCIN HCL IN DEXTROSE 1-5 GM/200ML-% IV SOLN
1000.0000 mg | Freq: Once | INTRAVENOUS | Status: AC
  Administered 2019-07-17: 1000 mg via INTRAVENOUS
  Filled 2019-07-17: qty 200

## 2019-07-17 MED ORDER — ONDANSETRON HCL 4 MG/2ML IJ SOLN
INTRAMUSCULAR | Status: AC
  Filled 2019-07-17: qty 4

## 2019-07-17 MED ORDER — HEPARIN SODIUM (PORCINE) 1000 UNIT/ML IJ SOLN
4000.0000 [IU] | Freq: Once | INTRAMUSCULAR | Status: AC
  Administered 2019-07-17: 1000 [IU] via INTRAVENOUS

## 2019-07-17 MED ORDER — MIDAZOLAM HCL 2 MG/2ML IJ SOLN
INTRAMUSCULAR | Status: AC
  Filled 2019-07-17: qty 4

## 2019-07-17 MED ORDER — HEPARIN SODIUM (PORCINE) 5000 UNIT/ML IJ SOLN
5000.0000 [IU] | Freq: Three times a day (TID) | INTRAMUSCULAR | Status: DC
  Administered 2019-07-17 – 2019-07-19 (×5): 5000 [IU] via SUBCUTANEOUS
  Filled 2019-07-17 (×5): qty 1

## 2019-07-17 MED ORDER — FENTANYL CITRATE (PF) 100 MCG/2ML IJ SOLN
INTRAMUSCULAR | Status: AC | PRN
  Administered 2019-07-17: 50 ug via INTRAVENOUS

## 2019-07-17 NOTE — Progress Notes (Signed)
[facility] Progress Note   Subjective:  Pt off at procedure this am, spoke w/ husband in room  Objective Vitals:   07/17/19 1200 07/17/19 1210 07/17/19 1215 07/17/19 1221  BP: (!) 176/80 (!) 168/81 (!) 181/89 (!) 184/92  Pulse: 72 84 76 73  Resp: 15 13 13 16   Temp:      TempSrc:      SpO2: 98% 96% 95% 100%  Weight:       Physical Exam Pt off for procedure  Additional Objective Labs: Basic Metabolic Panel: Recent Labs  Lab 07/15/19 0413 07/16/19 0353 07/17/19 0432  NA 133* 133* 133*  K 3.4* 3.4* 3.5  CL 89* 91* 94*  CO2 22 24 23   GLUCOSE 113* 165* 165*  BUN 63* 62* 60*  CREATININE 12.57* 12.37* 12.56*  CALCIUM 9.1 9.1 9.3  PHOS 7.3* 6.9* 6.3*   Liver Function Tests: Recent Labs  Lab 07/15/19 0413 07/16/19 0353 07/17/19 0432  ALBUMIN 2.1* 2.1* 2.1*   CBC: Recent Labs  Lab 07/12/19 0345 07/12/19 0345 07/13/19 0902 07/13/19 0902 07/14/19 0553 07/15/19 0413 07/17/19 0432  WBC 7.7   < > 8.0   < > 6.1 6.7 8.7  HGB 7.3*   < > 7.5*   < > 7.8* 7.4* 7.4*  HCT 24.9*   < > 24.8*   < > 26.0* 24.0* 24.5*  MCV 75.9*  --  75.8*  --  75.8* 75.5* 75.4*  PLT 187   < > 175   < > 185 190 190   < > = values in this interval not displayed.   Blood Culture    Component Value Date/Time   SDES PERITONEAL DIALYSATE 07/14/2019 1312   SPECREQUEST Immunocompromised 07/14/2019 1312   CULT FEW STAPHYLOCOCCUS EPIDERMIDIS 07/14/2019 1312   REPTSTATUS 07/17/2019 FINAL 07/14/2019 1312   Medications: . [START ON 07/18/2019] cefUROXime (ZINACEF)  IV    . dialysis solution 2.5% low-MG/low-CA Stopped (07/14/19 1555)  . dialysis solution 4.25% low-MG/low-CA     . aspirin EC  81 mg Oral Daily  . calcitRIOL  0.5 mcg Oral Q MTWThF   And  . calcitRIOL  1 mcg Oral Once per day on Sun Sat  . calcium acetate  1,334 mg Oral TID WC  . [START ON 07/18/2019] Chlorhexidine Gluconate Cloth  6 each Topical Q0600  . [START ON 07/18/2019] Chlorhexidine Gluconate Cloth  6 each  Topical Q0600  . gentamicin cream  1 application Topical Daily  . heparin injection (subcutaneous)  5,000 Units Subcutaneous Q8H  . heparin sodium (porcine)      . insulin aspart  0-6 Units Subcutaneous TID WC  . insulin glargine  14 Units Subcutaneous Daily  . labetalol  300 mg Oral BID  . lidocaine-EPINEPHrine      . losartan  100 mg Oral Daily  . mupirocin ointment  1 application Nasal BID  . pantoprazole  40 mg Oral Daily  . vancomycin variable dose per unstable renal function (pharmacist dosing)   Does not apply See admin instructions    [email or web address]: 5 cycles overnight, dwell volume of 2000, combo 2.5%, 4.25%, extraneal last fill.  Assessment: 54 year old black female with ESRD on PD - presenting with symptomatic anemia. However, found to have peritonitis and significant volume overload with low albumin.  Plan: 1. Acute on chronicanemia: Hgb 6.5 on admit - s/p 1U PRBCs. Given course IV iron, s/p ranesp 21mcg [place] on 4/13. Hgb 7.8 today - better. 2. Peritonitis:  PD Fluid Cx + MRSE - on Vanc, per pharmacy. No more abdominal pain, resolving.  Repeat TNC is down. Cont Vanc 2 wks total.  3. ESRD on CCPD - failing PD w/ nausea and vol overload unresponsive to aggressive PD Rx here. Suspect membrane failure. Pt agrees to proceed w/ HD. TDC per IR today, appreciate assist. Asked  [facility] to see for perm access. Will start CLIP process, her nephrologist is in [place]. HD today 2h and tomorrow 3 hrs. Get vol /solute down.  4. Volume overload - significant issue, HD will help 4. Diarrhea (off and on x years): C Diff antigen positive, toxin negative. Completed PO Vanc. 5. Hypertension: vol excess will be addressed w HD 6. Metabolic Bone Disease:CorrCa and Phos slightly high. Continue home calcitriol. Consider changing Phoslo to non-Ca binder if rises any further. 7. Nutrition: Alb low - adding pro-stat supplements. 8. DM: per  primary    Janet Splinter, MD 07/17/2019, 2:35 PM

## 2019-07-17 NOTE — Progress Notes (Signed)
PT Cancellation Note  Patient Details Name: Janet Mitchell MRN: [id number] DOB: 1965-09-30   Cancelled Treatment:    Reason Eval/Treat Not Completed: Patient at procedure or test/unavailable Patient off the floor at IR. Per discussion with nurse, patient okay to participate in PT session later today. PT to attempt at a later time/date,schedule permitting.  Birdie Hopes 07/17/2019, 11:21 AM

## 2019-07-17 NOTE — Progress Notes (Signed)
Pharmacy Antibiotic Note  Janet Mitchell is a 54 y.o. female admitted on 07/08/2019 with peritonitis.  Pharmacy has been consulted for Vancomycin dosing.   Staph epidermidis in culture (oxacillin resistant/ MRSE)   Switching from CCPD to HD.  TDC placed today IR > planning HD for 2.5 hrs tonight and 3 hrs on 4/22.   Intermittent Vanc dosing:  2 gm IV given on 4/13.    - random level 10 mcg/ml on 4/16 > 2 gm IV repeated.   - random level 31 mcg/ml on 4/17   - estimate vanc level will drop to ~15 mcg/ml after HD tonight.   - day # 9 Vancomycin, planning 14 days.   Plan:  Vancomycin 1gm IV after HD tonight.  Will plan to re-dose Vanc again after HD on 4/22, but will check random vanc level in am to be sure estimations are on track. F/u HD tolerance.  Day # 9 Vancomycin, planning 14 days.   Height: 68 inches Ideal body weight: 63.9 kg Weight: 125.6 kg (276 lb 14.4 oz)  Temp (24hrs), Avg:97.9 F (36.6 C), Min:97.4 F (36.3 C), Max:98.6 F (37 C)  Recent Labs  Lab 07/12/19 0345 07/12/19 0345 07/13/19 0902 07/14/19 0553 07/15/19 0413 07/16/19 0353 07/17/19 0432  WBC 7.7  --  8.0 6.1 6.7  --  8.7  CREATININE 12.07*   < > 12.23* 12.60* 12.57* 12.37* 12.56*  VANCORANDOM 10  --  31  --   --   --   --    < > = values in this interval not displayed.     No Known Allergies  Antimicrobials this admission: Vancomycin 4/13 >>  Ceftazidime x 1 on 4/13 Ceftriaxone 4/13>>4/15 (got 2 doses) Vancomycin oral 4/13>>4/14  Microbiology results: 4/13: Peritoneal washings: Staph epi - R oxa, cipro, clinda  - sens Gent MIC<0.5, TCN, Vanc MIC 1, Septra 4/18: peritoneal dialysate: rare Staph epi - same sensitivities 4/13: Cdiff: POSITIVE 4/12: COVID negative  Thank you for allowing pharmacy to be a part of this patient's care.  Arty Baumgartner, [place] Phone: [phone] 07/17/2019 4:12 PM

## 2019-07-17 NOTE — Progress Notes (Signed)
Patient has been given a MWF 10:15am seat for HD at her home clinic [facility].  Renal Navigator will meet with patient to inform. Nephrology team update.  Alphonzo Cruise, [place] Renal Navigator [phone]

## 2019-07-17 NOTE — TOC Initial Note (Signed)
Transition of Care [facility]) - Initial/Assessment Note    Patient Details  Name: Janet Mitchell MRN: [id number] Date of Birth: 1965-07-05  Transition of Care [facility]) CM/SW Contact:    Maryclare Labrador, RN Phone Number: 07/17/2019, 3:50 PM  Clinical Narrative:   PTA independent from home on PD.  Pt will transition this admit from PD to HD.  Clipping process complete however pt will need perm access prior to discharge.  Pt is interested in [facility] as recommended - medicare.gov HH list given. Pt chose [facility] - agency accepts referral pending orders                Expected Discharge Plan: [place] Barriers to Discharge: Continued Medical Work up   Patient Goals and CMS Choice Patient states their goals for this hospitalization and ongoing recovery are:: Pt states she is ready to get home CMS Medicare.gov Compare Post Acute Care list provided to:: Patient Choice offered to / list presented to : Patient  Expected Discharge Plan and Services Expected Discharge Plan: [place] Choice: [place] arrangements for the past 2 months: [place]: PT, OT [place] Agency: [place] ([place]) Date [place]: 07/17/19 Time [place]: [place] Representative spoke with at [place]: Butch Penny  Prior Living Arrangements/Services Living arrangements for the past 2 months: [place] Lives with:: Spouse Patient language and need for interpreter reviewed:: Yes Do you feel safe going back to the place where you live?: Yes      Need for Family Participation in Patient Care: Yes (Comment) Care giver support system in place?: Yes (comment)   Criminal Activity/Legal Involvement Pertinent to Current Situation/Hospitalization: No - Comment as needed  Activities of Daily Living Home Assistive Devices/Equipment: Other (Comment) ADL Screening (condition at time of  admission) Patient's cognitive ability adequate to safely complete daily activities?: Yes Is the patient deaf or have difficulty hearing?: No Does the patient have difficulty seeing, even when wearing glasses/contacts?: No Does the patient have difficulty concentrating, remembering, or making decisions?: No Patient able to express need for assistance with ADLs?: No Does the patient have difficulty dressing or bathing?: No Independently performs ADLs?: Yes (appropriate for developmental age) Does the patient have difficulty walking or climbing stairs?: No Weakness of Legs: Both Weakness of Arms/Hands: None  Permission Sought/Granted   Permission granted to share information with : Yes, Verbal Permission Granted              Emotional Assessment   Attitude/Demeanor/Rapport: Self-Confident, Engaged, Charismatic, Gracious Affect (typically observed): Adaptable, Accepting Orientation: : Oriented to Self, Oriented to Place, Oriented to  Time, Oriented to Situation   Psych Involvement: No (comment)  Admission diagnosis:  Shortness of breath [R06.02] SOB (shortness of breath) [R06.02] Peritonitis (HCC) [K65.9] Ascites [R18.8] Symptomatic anemia [D64.9] Patient Active Problem List   Diagnosis Date Noted  . Ascites 07/09/2019  . Acute blood loss anemia   . Hyperkalemia   . Hypotension   . Vertigo   . Black stool 04/04/2019  . Uremia, acute   . Weakness   . COVID-19 03/26/2019  . Chronic diarrhea 03/25/2019  . Uncontrolled type II diabetes mellitus with chronic kidney disease ([place])   . Hypokalemia   . Hyponatremia   .  Anemia in ESRD (end-stage renal disease) ([place])   . Essential hypertension, benign 02/14/2019  . Class 2 severe obesity due to excess calories with serious comorbidity and body mass index (BMI) of 37.0 to 37.9 in adult [facility]) 02/14/2019  . SBP (spontaneous bacterial peritonitis) ([place]) 12/18/2018  . Generalized abdominal pain 11/28/2018  . Rectal burning  08/27/2018  . Rectal bleeding 08/27/2018  . Diarrhea 05/22/2018  . Fluid overload 03/25/2018  . Microcytic anemia 03/25/2018  . Chronic cholecystitis with calculus 03/09/2018  . Type 2 diabetes mellitus with ESRD (end-stage renal disease) ([place]) 03/09/2018  . Pre-transplant evaluation for ESRD (end stage renal disease) 12/19/2017  . Symptomatic anemia 11/06/2017  . Essential hypertension 11/06/2017  . ESRD on peritoneal dialysis ([place]) 11/06/2017  . Diabetic macular edema ([place]) 04/10/2015  . Edema of lower extremity 04/10/2015  . Uncontrolled type 2 diabetes mellitus ([place]) 04/10/2015   PCP:  Lucianne Lei, MD Pharmacy:   [facility], [place] [address] Phone: [phone] Fax: [phone]  [facility] [place] - [address] [place] [address] Phone: [phone] Fax: [phone]     Social Determinants of Health (SDOH) Interventions    Readmission Risk Interventions Readmission Risk Prevention Plan 07/12/2019 04/05/2019  Transportation Screening Complete Complete  Medication Review (RN Care Manager) - Complete  [place] Not Applicable Patient Refused  Some recent data might be hidden

## 2019-07-17 NOTE — Progress Notes (Signed)
PROGRESS NOTE        PATIENT DETAILS Name: Janet Mitchell Age: 54 y.o. Sex: female Date of Birth: 05-28-1965 Admit Date: 07/08/2019 Admitting Physician Darliss Cheney, MD ZOX:[id number], Myra Rude, MD  Brief Narrative: Patient is a 54 y.o. female with history of ESRD on PD, HTN, DM-2, chronic diarrhea for the past 2 years-sent to [facility] ED for low hemoglobin/hematocrit and evaluation of worsening abdominal pain.  Found to have a hemoglobin of 6.5 PD catheter associated peritonitis, and anasarca related to poor ultrafiltration with PD.  She was transfused PRBC-started on empiric IV antibiotic-and subsequently adjustments were made with PD-unfortunately she continued to have anasarca-she finally consented to transition to hemodialysis on 4/20.  See below for further details  Significant events: 4/12>> admit to [facility] for severe anemia and PD catheter associated peritonitis 4/21>> HD catheter placed-with plans to start HD.  Antimicrobial therapy: Vancomycin: 4/13>> Rocephin: 4/13>>4/15  Microbiology data: 4/13>> peritoneal fluid culture: Staph epidermidis  Procedures : None  Consults: Nephrology IR VVS  DVT Prophylaxis : Prophylactic Heparin   Subjective: No diarrhea-no abdominal pain.  Intermittent headaches continues.  Assessment/Plan: Symptomatic anemia: No indication of blood loss-suspect related to underlying CKD.  Patient is s/p 1 unit of PRBC transfusion-IV iron/Aranesp per nephrology.  Hemoglobin slowly improving-follow periodically.  PD catheter associated peritonitis: Abdominal pain has improved-exam is benign-peritoneal fluid culture positive for staph epidermidis-continue IV vancomycin.  Diarrhea: Chronic issue-do not think she has C. difficile colitis.  She was in fact "constipated" for 2 weeks prior to admission-following which she had around 4-5 bowel movements (baseline) on the day of admission.  There has been no diarrhea since since then.  Suspect  C. Difficile studies are are more indicative of colonization rather than infection.  No longer on oral vancomycin.  Per patient-she has seen GI MD in [place] for chronic diarrhea-and at one point was placed on cholestyramine that she stopped taking.  ESRD on PD now being transitioned to hemodialysis: Per nephrology-has failed PD despite having maximal adjustments made-continues to have severe anasarca-she finally consented to be transition to hemodialysis.  HD catheter placed by IR on 4/21.  Nephrology continues to follow.    Anasarca: Concerned that she has failed peritoneal dialysis-remains massively volume overloaded-she has finally consented to transition to hemodialysis.   Headache: Suspect migraine headaches-responded to IV Depakote-but has had recurrence of these headaches-apparently does respond to Tylenol.  HTN: Remains on the higher side today-HD catheter placed-should improve post HD-continue losartan and labetalol for now.  Insulin-dependent DM-2 with episodes of hypoglycemia: CBG now stable-continue Lantus 14 units and SSI. Follow.   Recent Labs    07/16/19 1610 07/16/19 2043 07/17/19 0734  GLUCAP 105* 127* 137*   GERD: Continue PPI  Obesity: Estimated body mass index is 43.21 kg/m as calculated from the following:   Height as of 05/01/19: 5\' 8"  (1.727 m).   Weight as of this encounter: 128.9 kg.    Diet: Diet Order            Diet regular Room service appropriate? Yes; Fluid consistency: Thin  Diet effective now              Code Status: Full code   Family Communication: Spoke with spouse on 4/21  Disposition Plan: Likely home with home health services on discharge  Barriers to Discharge: Peritoneal dialysis catheter associated peritonitis remains  on IV vancomycin-anasarca resistant to PD with plans to switch to hemodialysis  Antimicrobial agents: Anti-infectives (From admission, onward)   Start     Dose/Rate Route Frequency Ordered Stop   07/18/19  0600  cefUROXime (ZINACEF) 1.5 g in sodium chloride 0.9 % 100 mL IVPB     1.5 g 200 mL/hr over 30 Minutes Intravenous To Surgery 07/17/19 1102 07/19/19 0600   07/17/19 1145  ceFAZolin (ANCEF) 3 g in dextrose 5 % 50 mL IVPB     3 g 100 mL/hr over 30 Minutes Intravenous To Radiology 07/17/19 1129 07/17/19 1241   07/12/19 0845  vancomycin (VANCOREADY) IVPB 2000 mg/400 mL     2,000 mg 200 mL/hr over 120 Minutes Intravenous  Once 07/12/19 0837 07/12/19 1243   07/11/19 1456  vancomycin variable dose per unstable renal function (pharmacist dosing)      Does not apply See admin instructions 07/11/19 1456     07/09/19 1800  vancomycin (VANCOCIN) 50 mg/mL oral solution 125 mg  Status:  Discontinued     125 mg Oral 4 times daily 07/09/19 1614 07/10/19 1546   07/09/19 1700  cefTRIAXone (ROCEPHIN) 2 g in sodium chloride 0.9 % 100 mL IVPB  Status:  Discontinued     2 g 200 mL/hr over 30 Minutes Intravenous Every 24 hours 07/09/19 1614 07/11/19 1451   07/09/19 1400  cefTAZidime (FORTAZ) 2 g in sodium chloride 0.9 % 100 mL IVPB     2 g 200 mL/hr over 30 Minutes Intravenous  Once 07/09/19 1354 07/09/19 1900   07/09/19 1345  vancomycin (VANCOREADY) IVPB 2000 mg/400 mL    Note to Pharmacy: For peritonitis in PD patient   2,000 mg 200 mL/hr over 120 Minutes Intravenous  Once 07/09/19 1336 07/09/19 1851   07/09/19 1345  cefTAZidime (FORTAZ) 2 g in sodium chloride 0.9 % 100 mL IVPB  Status:  Discontinued    Note to Pharmacy: For peritonitis in PD patient   2 g 200 mL/hr over 30 Minutes Intravenous  Once 07/09/19 1336 07/09/19 1354       Time spent: 25 minutes-Greater than 50% of this time was spent in counseling, explanation of diagnosis, planning of further management, and coordination of care.  MEDICATIONS: Scheduled Meds: . aspirin EC  81 mg Oral Daily  . calcitRIOL  0.5 mcg Oral Q [id number]   And  . calcitRIOL  1 mcg Oral Once per day on Sun Sat  . calcium acetate  1,334 mg Oral TID WC  .  gentamicin cream  1 application Topical Daily  . heparin injection (subcutaneous)  5,000 Units Subcutaneous Q8H  . heparin sodium (porcine)      . insulin aspart  0-6 Units Subcutaneous TID WC  . insulin glargine  14 Units Subcutaneous Daily  . labetalol  300 mg Oral BID  . lidocaine-EPINEPHrine      . losartan  100 mg Oral Daily  . mupirocin ointment  1 application Nasal BID  . pantoprazole  40 mg Oral Daily  . vancomycin variable dose per unstable renal function (pharmacist dosing)   Does not apply See admin instructions   Continuous Infusions: . [START ON 07/18/2019] cefUROXime (ZINACEF)  IV    . dialysis solution 2.5% low-MG/low-CA Stopped (07/14/19 1555)  . dialysis solution 4.25% low-MG/low-CA     PRN Meds:.acetaminophen, dianeal solution for CAPD/CCPD with heparin, dianeal solution for CAPD/CCPD with heparin, hydrALAZINE, ondansetron **OR** ondansetron (ZOFRAN) IV, oxyCODONE-acetaminophen, polyethylene glycol   PHYSICAL EXAM: Vital signs: Vitals:  07/17/19 1200 07/17/19 1210 07/17/19 1215 07/17/19 1221  BP: (!) 176/80 (!) 168/81 (!) 181/89 (!) 184/92  Pulse: 72 84 76 73  Resp: 15 13 13 16   Temp:      TempSrc:      SpO2: 98% 96% 95% 100%  Weight:       Filed Weights   07/15/19 1707 07/16/19 0301 07/16/19 0534  Weight: 127.4 kg 131.2 kg 128.9 kg   Body mass index is 43.21 kg/m.   Gen Exam:Alert awake-not in any distress HEENT:atraumatic, normocephalic Chest: B/L clear to auscultation anteriorly CVS:S1S2 regular Abdomen:soft non tender, non distended Extremities:++ edema Neurology: Non focal Skin: no rash  I have personally reviewed following labs and imaging studies  LABORATORY DATA: CBC: Recent Labs  Lab 07/12/19 0345 07/13/19 0902 07/14/19 0553 07/15/19 0413 07/17/19 0432  WBC 7.7 8.0 6.1 6.7 8.7  HGB 7.3* 7.5* 7.8* 7.4* 7.4*  HCT 24.9* 24.8* 26.0* 24.0* 24.5*  MCV 75.9* 75.8* 75.8* 75.5* 75.4*  PLT 187 175 185 190 373    Basic Metabolic  Panel: Recent Labs  Lab 07/13/19 0902 07/14/19 0553 07/15/19 0413 07/16/19 0353 07/17/19 0432  NA 134* 132* 133* 133* 133*  K 3.7 3.6 3.4* 3.4* 3.5  CL 92* 90* 89* 91* 94*  CO2 21* 22 22 24 23   GLUCOSE 114* 122* 113* 165* 165*  BUN 67* 65* 63* 62* 60*  CREATININE 12.23* 12.60* 12.57* 12.37* 12.56*  CALCIUM 9.1 9.1 9.1 9.1 9.3  PHOS 7.8* 7.8* 7.3* 6.9* 6.3*    GFR: Estimated Creatinine Clearance: 7.5 mL/min (A) (by C-G formula based on SCr of 12.56 mg/dL (H)).  Liver Function Tests: Recent Labs  Lab 07/13/19 0902 07/14/19 0553 07/15/19 0413 07/16/19 0353 07/17/19 0432  ALBUMIN 2.3* 2.3* 2.1* 2.1* 2.1*   No results for input(s): LIPASE, AMYLASE in the last 168 hours. No results for input(s): AMMONIA in the last 168 hours.  Coagulation Profile: No results for input(s): INR, PROTIME in the last 168 hours.  Cardiac Enzymes: No results for input(s): CKTOTAL, CKMB, CKMBINDEX, TROPONINI in the last 168 hours.  BNP (last 3 results) No results for input(s): PROBNP in the last 8760 hours.  Lipid Profile: No results for input(s): CHOL, HDL, LDLCALC, TRIG, CHOLHDL, LDLDIRECT in the last 72 hours.  Thyroid Function Tests: No results for input(s): TSH, T4TOTAL, FREET4, T3FREE, THYROIDAB in the last 72 hours.  Anemia Panel: No results for input(s): VITAMINB12, FOLATE, FERRITIN, TIBC, IRON, RETICCTPCT in the last 72 hours.  Urine analysis:    Component Value Date/Time   COLORURINE AMBER (A) 07/31/2018 1800   APPEARANCEUR TURBID (A) 07/31/2018 1800   LABSPEC >1.046 (H) 07/31/2018 1800   PHURINE 5.0 07/31/2018 1800   GLUCOSEU 150 (A) 07/31/2018 1800   HGBUR SMALL (A) 07/31/2018 1800   BILIRUBINUR NEGATIVE 07/31/2018 1800   KETONESUR NEGATIVE 07/31/2018 1800   PROTEINUR >=300 (A) 07/31/2018 1800   NITRITE NEGATIVE 07/31/2018 1800   LEUKOCYTESUR MODERATE (A) 07/31/2018 1800    Sepsis Labs: Lactic Acid, Venous No results found for:  LATICACIDVEN  MICROBIOLOGY: Recent Results (from the past 240 hour(s))  SARS CORONAVIRUS 2 (TAT 6-24 HRS) Nasopharyngeal Nasopharyngeal Swab     Status: None   Collection Time: 07/08/19 10:04 PM   Specimen: Nasopharyngeal Swab  Result Value Ref Range Status   SARS Coronavirus 2 NEGATIVE NEGATIVE Final    Comment: (NOTE) SARS-CoV-2 target nucleic acids are NOT DETECTED. The SARS-CoV-2 RNA is generally detectable in upper and lower respiratory specimens during the  acute phase of infection. Negative results do not preclude SARS-CoV-2 infection, do not rule out co-infections with other pathogens, and should not be used as the sole basis for treatment or other patient management decisions. Negative results must be combined with clinical observations, patient history, and epidemiological information. The expected result is Negative. Fact Sheet for Patients: SugarRoll.be Fact Sheet for Healthcare Providers: https://www.woods-mathews.com/ This test is not yet approved or cleared by the [place] FDA and  has been authorized for detection and/or diagnosis of SARS-CoV-2 by FDA under an Emergency Use Authorization (EUA). This EUA will remain  in effect (meaning this test can be used) for the duration of the COVID-19 declaration under Section 56 4(b)(1) of the Act, 21 U.S.C. section 360bbb-3(b)(1), unless the authorization is terminated or revoked sooner. Performed at [facility], [place] [address] [place], [place] [address]   Gram stain     Status: None   Collection Time: 07/09/19  8:55 AM   Specimen: Abdomen; Peritoneal Fluid  Result Value Ref Range Status   Specimen Description ABDOMEN  Final   Special Requests NONE  Final   Gram Stain   Final    WBC PRESENT,BOTH PMN AND MONONUCLEAR GRAM POSITIVE COCCI CYTOSPIN SMEAR Gram Stain Report Called to,Read Back By and Verified WithKym Groom RN 1700 07/09/19 A BROWNING Performed at [facility], [place] [address] [place], [place] [address]    Report Status 07/09/2019 FINAL  Final  Culture, body fluid-bottle     Status: Abnormal   Collection Time: 07/09/19  8:55 AM   Specimen: Peritoneal Washings  Result Value Ref Range Status   Specimen Description PERITONEAL ABDOMEN  Final   Special Requests NONE  Final   Gram Stain   Final    GRAM POSITIVE COCCI IN CLUSTERS IN BOTH AEROBIC AND ANAEROBIC BOTTLES CRITICAL RESULT CALLED TO, READ BACK BY AND VERIFIED WITH: Manya Silvas [id number] 07/10/2019 Mena Goes Performed at [facility], [place] [address] [place], [place] [address]    Culture STAPHYLOCOCCUS EPIDERMIDIS (A)  Final   Report Status 07/12/2019 FINAL  Final   Organism ID, Bacteria STAPHYLOCOCCUS EPIDERMIDIS  Final      Susceptibility   Staphylococcus epidermidis - MIC*    CIPROFLOXACIN 4 RESISTANT Resistant     ERYTHROMYCIN >=8 RESISTANT Resistant     GENTAMICIN <=0.5 SENSITIVE Sensitive     OXACILLIN >=4 RESISTANT Resistant     TETRACYCLINE 2 SENSITIVE Sensitive     VANCOMYCIN 1 SENSITIVE Sensitive     TRIMETH/SULFA 20 SENSITIVE Sensitive     CLINDAMYCIN >=8 RESISTANT Resistant     RIFAMPIN <=0.5 SENSITIVE Sensitive     Inducible Clindamycin NEGATIVE Sensitive     * STAPHYLOCOCCUS EPIDERMIDIS  C Difficile Quick Screen w PCR reflex     Status: Abnormal   Collection Time: 07/09/19  2:27 PM   Specimen: STOOL  Result Value Ref Range Status   C Diff antigen POSITIVE (A) NEGATIVE Final   C Diff toxin NEGATIVE NEGATIVE Final   C Diff interpretation Results are indeterminate. See PCR results.  Final    Comment: Performed at [facility], [place] [address] [place], [place] [address]  C. Diff by PCR, Reflexed     Status: Abnormal   Collection Time: 07/09/19  2:27 PM  Result Value Ref Range Status   Toxigenic C. Difficile by PCR POSITIVE (A) NEGATIVE Final    Comment: Positive for toxigenic C. difficile with little to no toxin  production. Only treat if clinical  presentation suggests symptomatic illness. Performed at [facility], [place] [address] [place], [place] [address]   Body fluid culture     Status: None   Collection Time: 07/14/19  1:12 PM   Specimen: Peritoneal Dialysate; Body Fluid  Result Value Ref Range Status   Specimen Description PERITONEAL DIALYSATE  Final   Special Requests Immunocompromised  Final   Gram Stain   Final    NO WBC SEEN NO ORGANISMS SEEN Performed at [facility], [address], [place], [place] [address]    Culture FEW STAPHYLOCOCCUS EPIDERMIDIS  Final   Report Status 07/17/2019 FINAL  Final   Organism ID, Bacteria STAPHYLOCOCCUS EPIDERMIDIS  Final      Susceptibility   Staphylococcus epidermidis - MIC*    CIPROFLOXACIN 4 RESISTANT Resistant     ERYTHROMYCIN >=8 RESISTANT Resistant     GENTAMICIN <=0.5 SENSITIVE Sensitive     OXACILLIN >=4 RESISTANT Resistant     TETRACYCLINE 2 SENSITIVE Sensitive     VANCOMYCIN 1 SENSITIVE Sensitive     TRIMETH/SULFA 20 SENSITIVE Sensitive     CLINDAMYCIN >=8 RESISTANT Resistant     RIFAMPIN <=0.5 SENSITIVE Sensitive     Inducible Clindamycin NEGATIVE Sensitive     * FEW STAPHYLOCOCCUS EPIDERMIDIS  Surgical PCR screen     Status: None   Collection Time: 07/17/19 10:03 AM   Specimen: Nasal Mucosa; Nasal Swab  Result Value Ref Range Status   MRSA, PCR NEGATIVE NEGATIVE Final   Staphylococcus aureus NEGATIVE NEGATIVE Final    Comment: (NOTE) The Xpert SA Assay (FDA approved for NASAL specimens in patients 76 years of age and older), is one component of a comprehensive surveillance program. It is not intended to diagnose infection nor to guide or monitor treatment. Performed at [facility], [place] [address] [place], [place] [address]     RADIOLOGY STUDIES/RESULTS: No results found.   LOS: 8 days   Oren Binet, MD  [facility]    To contact the attending provider between 7A-7P or the covering provider during after hours 7P-7A,  please log into the web site [email or web address] and access using universal [place] password for that web site. If you do not have the password, please call the hospital operator.  07/17/2019, 1:24 PM

## 2019-07-17 NOTE — Consult Note (Addendum)
History of Present Illness:  Patient is a 54 y.o. year old female who presents for placement of a permanent hemodialysis access and TDC.   She has been on PD and developed peritonitis with Fluid Cx +MRSE- on Vanc, per pharmacy.    The patient is right handed.  The patient is not currently on hemodialysis, but has been on PD for 5 years. Other chronic medical problems include hypertension, diabetes mellitus, CHF and ESRD.  She denise chest implants and not on anticoagulation.  Past medical history: DM, ESRD, CHF and HTN.  Past Medical History:  Diagnosis Date  . Anemia   . CHF (congestive heart failure) ([place])   . Chronic cholecystitis with calculus   . COVID-19 virus infection 03/2019  . ESRD (end stage renal disease) on dialysis [facility])    "peritoneal dialysis q hs" (03/09/2018)  . Headache    "a few/wk" (03/09/2018)  . History of blood transfusion 10/2017   "low blood count" (03/09/2018)  . Hypertension   . Spinal headache   . Type II diabetes mellitus ([place])     Past Surgical History:  Procedure Laterality Date  . AMPUTATION TOE Left 2013   Great toe  . BIOPSY  10/24/2018   Procedure: BIOPSY;  Surgeon: Daneil Dolin, MD;  [facility];  Service: Endoscopy;;  right and left colon  . CATARACT EXTRACTION W/ INTRAOCULAR LENS IMPLANT Right   . CESAREAN SECTION  1994; 1998  . CHOLECYSTECTOMY N/A 03/09/2018   Procedure: LAPAROSCOPIC CHOLECYSTECTOMY WITH INTRAOPERATIVE CHOLANGIOGRAM ERAS PATHWAY;  Surgeon: Donnie Mesa, MD;  Location: [place];  Service: General;  Laterality: N/A;  . COLONOSCOPY N/A 10/24/2018   Procedure: COLONOSCOPY;  Surgeon: Daneil Dolin, MD;  [facility];  Service: Endoscopy;  Laterality: N/A;  1:00pm  . EYE SURGERY  05/15/2018   Removal of blood in the globe (due to DM)  . FLEXIBLE SIGMOIDOSCOPY N/A 11/24/2017   Procedure: FLEXIBLE SIGMOIDOSCOPY;  Surgeon: Daneil Dolin, MD;  [facility];  Service: Endoscopy;   Laterality: N/A;  . IR PARACENTESIS  07/09/2019  . LAPAROSCOPIC CHOLECYSTECTOMY  03/09/2018  . PERITONEAL CATHETER INSERTION  2017  . POLYPECTOMY  10/24/2018   Procedure: POLYPECTOMY;  Surgeon: Daneil Dolin, MD;  [facility];  Service: Endoscopy;;  . TOTAL HIP ARTHROPLASTY Left 1997     Social History Social History   Tobacco Use  . Smoking status: Passive Smoke Exposure - Never Smoker  . Smokeless tobacco: Never Used  Substance Use Topics  . Alcohol use: Never  . Drug use: Never    Family History Family History  Problem Relation Age of Onset  . Heart disease Mother   . Thrombocytopenia Mother        TTP  . Heart failure Father   . Kidney disease Paternal Grandfather   . Colon cancer Neg Hx     Allergies  No Known Allergies   Current Facility-Administered Medications  Medication Dose Route Frequency Provider Last Rate Last Admin  . acetaminophen (TYLENOL) tablet 650 mg  650 mg Oral Q6H PRN Jonetta Osgood, MD   650 mg at 07/16/19 1531  . aspirin EC tablet 81 mg  81 mg Oral Daily Vernelle Emerald, MD   81 mg at 07/16/19 0804  . calcitRIOL (ROCALTROL) capsule 0.5 mcg  0.5 mcg Oral Q MTWThF Shalhoub, Sherryll Burger, MD   0.5 mcg at 07/16/19 1026   And  . calcitRIOL (ROCALTROL) capsule  1 mcg  1 mcg Oral Once per day on Sun Sat Vernelle Emerald, MD   1 mcg at 07/14/19 8338  . calcium acetate (PHOSLO) capsule 1,334 mg  1,334 mg Oral TID WC Shalhoub, Sherryll Burger, MD   1,334 mg at 07/16/19 1756  . dialysis solution 2.5% low-MG/low-CA dianeal solution 5,000 mL  5,000 mL Intraperitoneal Q24H Corliss Parish, MD   Stopped at 07/14/19 1555  . dialysis solution 4.25% low-MG/low-CA dianeal solution 5,000 mL  5,000 mL Intraperitoneal Q24H Corliss Parish, MD   5,000 mL at 07/13/19 1950  . gentamicin cream (GARAMYCIN) 0.1 % 1 application  1 application Topical Daily Corliss Parish, MD   1 application at 25/05/39 [phone]  . heparin 2,500 Units in dialysis  solution 2.5% low-MG/low-CA 5,000 mL dialysis solution   Peritoneal Dialysis PRN Corliss Parish, MD   Given at 07/13/19 1951  . heparin 2,500 Units in dialysis solution 4.25% low-MG/low-CA 5,000 mL dialysis solution   Peritoneal Dialysis PRN Corliss Parish, MD   Given at 07/13/19 1952  . hydrALAZINE (APRESOLINE) injection 10 mg  10 mg Intravenous Q6H PRN Darliss Cheney, MD   10 mg at 07/16/19 0819  . insulin aspart (novoLOG) injection 0-6 Units  0-6 Units Subcutaneous TID WC Jonetta Osgood, MD   Stopped at 07/16/19 1613  . insulin glargine (LANTUS) injection 14 Units  14 Units Subcutaneous Daily Jonetta Osgood, MD   14 Units at 07/16/19 0804  . labetalol (NORMODYNE) tablet 300 mg  300 mg Oral BID Vernelle Emerald, MD   300 mg at 07/16/19 2133  . losartan (COZAAR) tablet 100 mg  100 mg Oral Daily Vernelle Emerald, MD   100 mg at 07/16/19 0804  . mupirocin ointment (BACTROBAN) 2 % 1 application  1 application Nasal BID Jonetta Osgood, MD      . ondansetron [facility]) tablet 4 mg  4 mg Oral Q6H PRN Vernelle Emerald, MD   4 mg at 07/13/19 2119   Or  . ondansetron (ZOFRAN) injection 4 mg  4 mg Intravenous Q6H PRN Vernelle Emerald, MD   4 mg at 07/16/19 1026  . oxyCODONE-acetaminophen (PERCOCET/ROXICET) 5-325 MG per tablet 1 tablet  1 tablet Oral Q4H PRN Shalhoub, Sherryll Burger, MD   1 tablet at 07/14/19 2109  . pantoprazole (PROTONIX) EC tablet 40 mg  40 mg Oral Daily Shalhoub, Sherryll Burger, MD   40 mg at 07/16/19 0805  . polyethylene glycol (MIRALAX / GLYCOLAX) packet 17 g  17 g Oral Daily PRN Shalhoub, Sherryll Burger, MD      . vancomycin variable dose per unstable renal function (pharmacist dosing)   Does not apply See admin instructions Karren Cobble, RPH        ROS:   General:  No weight loss, Fever, chills  HEENT: No recent headaches, no nasal bleeding, no visual changes, no sore throat  Neurologic: No dizziness, blackouts, seizures. No recent symptoms of stroke or mini-  stroke. No recent episodes of slurred speech, or temporary blindness.  Cardiac: No recent episodes of chest pain/pressure, no shortness of breath at rest.  No shortness of breath with exertion.  Denies history of atrial fibrillation or irregular heartbeat  Vascular: No history of rest pain in feet.  No history of claudication.  No history of non-healing ulcer, No history of DVT   Pulmonary: No home oxygen, no productive cough, no hemoptysis,  No asthma or wheezing  Musculoskeletal:  [ ]  Arthritis, [ ]  Low  back pain,  [ ]  Joint pain  Hematologic:No history of hypercoagulable state.  No history of easy bleeding.  + history of anemia  Gastrointestinal: No hematochezia or melena,  No gastroesophageal reflux, no trouble swallowing  Urinary: [ ]  chronic Kidney disease, [ ]  on PD in the past - [ ]  MWF or [ ]  TTHS, [ ]  Burning with urination, [ ]  Frequent urination, [ ]  Difficulty urinating;   Skin: No rashes  Psychological: No history of anxiety,  No history of depression   Physical Examination  Vitals:   07/16/19 2133 07/17/19 0400 07/17/19 0620 07/17/19 1001  BP: (!) 153/76 (!) 189/72 (!) 182/72 (!) 175/74  Pulse: 68 71 71 72  Resp:    18  Temp:   97.8 F (36.6 C) 98.2 F (36.8 C)  TempSrc:   Oral Oral  SpO2:  (!) 89% 93% 93%  Weight:        Body mass index is 43.21 kg/m.  General:  Alert and oriented, no acute distress HEENT: Normal Neck: No bruit or JVD Pulmonary: Clear to auscultation bilaterally Cardiac: Regular Rate and Rhythm without murmur Gastrointestinal: Soft, non-tender, non-distended, no mass, no scars Skin: No rash Extremity Pulses:  2+ radial, brachial pulses bilaterally Musculoskeletal: No deformity or edema  Neurologic: Upper and lower extremity motor 5/5 and symmetric  DATA:  Pending vein mapping  ASSESSMENT:  ESRD was on PD for the past 5 years and now has peritonitis.   PLAN: She is right hand dominant pending vein mapping we will plan left UE  HD permanent access in the near future.  She will have [place] placed today.  She is currently NPO.  Roxy Horseman PA-C [facility] [place] Office: [phone]  I have examined the patient, reviewed and agree with above.  We were consulted today for tunneled hemodialysis catheter placement and discussion of permanent access for hemodialysis.  The patient was actually sent to interventional radiology and has a tunneled catheter and will initiate hemodialysis.  She is right-handed.  She is morbidly obese.  She has a IV in her left forearm and a recent vena puncture in her right antecubital vein.  Vein map is pending.  I had a long discussion with the patient and her family present.  She is familiar with peritoneal dialysis now for many years.  Have discussed transition to hemodialysis.  She understands that she can dialyze via her catheter for several months with minimal risk.  Will make recommendation regarding laterality and graft versus fistula pending vein map.  She has extremely small surface veins bilaterally.  Depending on timing of her recovery, could potentially have permanent access while an inpatient or could return as an outpatient.  Curt Jews, MD 07/17/2019 4:38 PM

## 2019-07-17 NOTE — Progress Notes (Signed)
MEDICATION-RELATED CONSULT NOTE   IR Procedure Consult - Anticoagulant/Antiplatelet PTA/Inpatient Med List Review by Pharmacist    Procedure:  R IJ tunneled HD catheter placement    Completed: 12:33pm on 4/21  Post-Procedural bleeding risk per IR MD assessment:  low  Antithrombotic medications on inpatient or PTA profile prior to procedure:    Heparin 5000 units SQ q8h for VTE prophylaxis.  Last dose 4/21 ~9:30pm   Recommended restart time per IR Post-Procedure Guidelines:    - at least 4 hours post-procedure  Plan:      Resume SQ heparin at 10pm tonight.  Arty Baumgartner, [place] Phone: [phone] 07/17/2019 4:21 PM

## 2019-07-17 NOTE — Procedures (Signed)
  Procedure: R IJ tunneled HD catheter placement   EBL:   minimal Complications:  none immediate  See full dictation in [facility].  Dillard Cannon MD Main # [phone] Pager  [phone]

## 2019-07-17 NOTE — Progress Notes (Signed)
  PT Cancellation Note  Patient Details Name: Janet Mitchell MRN: [id number] DOB: 09-26-1965   Cancelled Treatment:    Reason Eval/Treat Not Completed: Other (comment)(Patient declined, about to go to HD for first time)  2nd PT attempt today. Patient declined at this time. PT to attempt at a later date.  Birdie Hopes 07/17/2019, 2:23 PM

## 2019-07-17 NOTE — Anesthesia Preprocedure Evaluation (Signed)
Anesthesia Evaluation  Patient identified by MRN, date of birth, ID band Patient awake    Reviewed: Allergy & Precautions, NPO status , Patient's Chart, lab work & pertinent test results  History of Anesthesia Complications (+) POST - OP SPINAL HEADACHE  Airway Mallampati: II  TM Distance: >3 FB Neck ROM: Full    Dental  (+) Dental Advisory Given   Pulmonary neg pulmonary ROS,    breath sounds clear to auscultation       Cardiovascular hypertension, Pt. on medications and Pt. on home beta blockers +CHF   Rhythm:Regular Rate:Normal     Neuro/Psych negative neurological ROS     GI/Hepatic negative GI ROS, Neg liver ROS,   Endo/Other  diabetes, Type 2Morbid obesity  Renal/GU ESRF and DialysisRenal disease     Musculoskeletal   Abdominal   Peds  Hematology  (+) anemia ,   Anesthesia Other Findings   Reproductive/Obstetrics                             Anesthesia Physical Anesthesia Plan  ASA: III  Anesthesia Plan: General   Post-op Pain Management:    Induction: Intravenous  PONV Risk Score and Plan: 3 and Dexamethasone, Ondansetron and Treatment may vary due to age or medical condition  Airway Management Planned: LMA  Additional Equipment: None  Intra-op Plan:   Post-operative Plan: Extubation in OR  Informed Consent: I have reviewed the patients History and Physical, chart, labs and discussed the procedure including the risks, benefits and alternatives for the proposed anesthesia with the patient or authorized representative who has indicated his/her understanding and acceptance.     Dental advisory given  Plan Discussed with: CRNA  Anesthesia Plan Comments:         Anesthesia Quick Evaluation

## 2019-07-18 ENCOUNTER — Inpatient Hospital Stay (HOSPITAL_COMMUNITY): Payer: Medicare Other

## 2019-07-18 DIAGNOSIS — N186 End stage renal disease: Secondary | ICD-10-CM

## 2019-07-18 LAB — CBC
HCT: 22.8 % — ABNORMAL LOW (ref 36.0–46.0)
Hemoglobin: 6.8 g/dL — CL (ref 12.0–15.0)
MCH: 23.1 pg — ABNORMAL LOW (ref 26.0–34.0)
MCHC: 29.8 g/dL — ABNORMAL LOW (ref 30.0–36.0)
MCV: 77.6 fL — ABNORMAL LOW (ref 80.0–100.0)
Platelets: 164 10*3/uL (ref 150–400)
RBC: 2.94 MIL/uL — ABNORMAL LOW (ref 3.87–5.11)
RDW: 20.9 % — ABNORMAL HIGH (ref 11.5–15.5)
WBC: 8.9 10*3/uL (ref 4.0–10.5)
nRBC: 0 % (ref 0.0–0.2)

## 2019-07-18 LAB — RENAL FUNCTION PANEL
Albumin: 2 g/dL — ABNORMAL LOW (ref 3.5–5.0)
Anion gap: 15 (ref 5–15)
BUN: 40 mg/dL — ABNORMAL HIGH (ref 6–20)
CO2: 25 mmol/L (ref 22–32)
Calcium: 8.8 mg/dL — ABNORMAL LOW (ref 8.9–10.3)
Chloride: 95 mmol/L — ABNORMAL LOW (ref 98–111)
Creatinine, Ser: 9.59 mg/dL — ABNORMAL HIGH (ref 0.44–1.00)
GFR calc Af Amer: 5 mL/min — ABNORMAL LOW (ref >60)
GFR calc non Af Amer: 4 mL/min — ABNORMAL LOW (ref >60)
Glucose, Bld: 96 mg/dL (ref 70–99)
Phosphorus: 4.5 mg/dL (ref 2.5–4.6)
Potassium: 3.8 mmol/L (ref 3.5–5.1)
Sodium: 135 mmol/L (ref 135–145)

## 2019-07-18 LAB — GLUCOSE, CAPILLARY
Glucose-Capillary: 112 mg/dL — ABNORMAL HIGH (ref 70–99)
Glucose-Capillary: 73 mg/dL (ref 70–99)
Glucose-Capillary: 97 mg/dL (ref 70–99)
Glucose-Capillary: 167 mg/dL — ABNORMAL HIGH (ref 70–99)

## 2019-07-18 LAB — VANCOMYCIN, RANDOM: Vancomycin Rm: 24

## 2019-07-18 LAB — PREPARE RBC (CROSSMATCH)

## 2019-07-18 MED ORDER — CALCITRIOL 0.5 MCG PO CAPS
ORAL_CAPSULE | ORAL | Status: AC
  Filled 2019-07-18: qty 1

## 2019-07-18 MED ORDER — FUROSEMIDE 10 MG/ML IJ SOLN
20.0000 mg | Freq: Once | INTRAMUSCULAR | Status: AC
  Administered 2019-07-18: 20 mg via INTRAVENOUS
  Filled 2019-07-18: qty 2

## 2019-07-18 MED ORDER — DIPHENHYDRAMINE HCL 50 MG/ML IJ SOLN
25.0000 mg | Freq: Once | INTRAMUSCULAR | Status: AC
  Administered 2019-07-18: 25 mg via INTRAVENOUS
  Filled 2019-07-18: qty 1

## 2019-07-18 MED ORDER — HEPARIN SODIUM (PORCINE) 1000 UNIT/ML IJ SOLN
INTRAMUSCULAR | Status: AC
  Administered 2019-07-18: 3200 [IU] via INTRAVENOUS
  Filled 2019-07-18: qty 4

## 2019-07-18 MED ORDER — SODIUM CHLORIDE 0.9% IV SOLUTION: Freq: Once | INTRAVENOUS | Status: AC

## 2019-07-18 MED ORDER — INSULIN GLARGINE 100 UNIT/ML ~~LOC~~ SOLN
10.0000 [IU] | Freq: Every day | SUBCUTANEOUS | Status: DC
  Administered 2019-07-19: 10 [IU] via SUBCUTANEOUS
  Filled 2019-07-18: qty 0.1

## 2019-07-18 MED ORDER — ACETAMINOPHEN 325 MG PO TABS
650.0000 mg | ORAL_TABLET | Freq: Once | ORAL | Status: AC
  Administered 2019-07-18: 650 mg via ORAL
  Filled 2019-07-18: qty 2

## 2019-07-18 NOTE — Progress Notes (Signed)
Physical Therapy Attempt Note  Patient off the floor at this time at dialysis. H/H low, unit of blood ordered.  PT to attempt at a later time/date, schedule permitting.   Birdie Hopes, PT, DPT Acute Rehab [phone] office

## 2019-07-18 NOTE — Progress Notes (Signed)
Patient off unit via bed by transport for HD. No distress noted. Patient denies needs.

## 2019-07-18 NOTE — Progress Notes (Signed)
Pharmacy Antibiotic Note  Janet Mitchell is a 54 y.o. female admitted on 07/08/2019 with peritonitis.  Pharmacy has been consulted for Vancomycin dosing.   Staph epidermidis in culture (oxacillin resistant/ MRSE)   Switched from [facility] to HD.  TDC placed by IR on 4/21 > HD for 2 hrs last night and 3 hrs this morning, BFR 400   Intermittent Vanc dosing:  2 gm IV given on 4/13.    - random level 10 mcg/ml on 4/16 > 2 gm IV repeated.   - random level 31 mcg/ml on 4/17   - estimated vanc level would drop to ~15 mcg/ml after HD 4/21   - Vancomycin 1gm IV given post-HD on 4/21 pm   - Random Vanc level today done after HD (rather than pre-HD) = 24 mcg/ml. Higher than anticipated.  Plan:  Vancomycin level post-HD is adequate, no Vanc today.  Will follow up for next HD and plan to re-dose Vanc at that time.  Day # 10 Vancomycin, planning 14 days.   Height: 68 inches Ideal body weight: 63.9 kg Weight: 124.5 kg (274 lb 7.6 oz)  Temp (24hrs), Avg:98.3 F (36.8 C), Min:97.9 F (36.6 C), Max:98.7 F (37.1 C)  Recent Labs  Lab 07/13/19 0902 07/13/19 0902 07/14/19 0553 07/15/19 0413 07/16/19 0353 07/17/19 0432 07/18/19 0500 07/18/19 1108  WBC 8.0  --  6.1 6.7  --  8.7 8.9  --   CREATININE 12.23*   < > 12.60* 12.57* 12.37* 12.56* 9.59*  --   VANCORANDOM 31  --   --   --   --   --   --  24   < > = values in this interval not displayed.     No Known Allergies  Antimicrobials this admission: Vancomycin 4/13 >>  Ceftazidime x 1 on 4/13 Ceftriaxone 4/13>>4/15 (got 2 doses) Vancomycin oral 4/13>>4/14  Microbiology results: 4/13: Peritoneal washings: Staph epi - R oxa, cipro, clinda  - sens Gent MIC<0.5, TCN, Vanc MIC 1, Septra 4/18: peritoneal dialysate: rare Staph epi - same sensitivities 4/13: Cdiff: POSITIVE 4/12: COVID negative  Thank you for allowing pharmacy to be a part of this patient's care.  Arty Baumgartner, [place] Phone: [phone] 07/18/2019 12:27 PM

## 2019-07-18 NOTE — H&P (View-Only) (Signed)
Progress Note    07/18/2019 9:00 AM   Subjective: Patient seen and evaluated in inpatient hemodialysis unit.  She states improvement in her abdominal fullness.  Treatment via right IJ  Palindrome 19  hemodialysis catheter without apparent complications    Vitals:   07/18/19 0800 07/18/19 0830  BP: (!) 149/76 130/72  Pulse: 69 69  Resp:    Temp:    SpO2:      Physical Exam: Cardiac:  RRR Respiratory: Nonlabored Abdomen:  Soft,, NT  CBC    Component Value Date/Time   WBC 8.7 07/17/2019 0432   RBC 3.25 (L) 07/17/2019 0432   HGB 7.4 (L) 07/17/2019 0432   HCT 24.5 (L) 07/17/2019 0432   PLT 190 07/17/2019 0432   MCV 75.4 (L) 07/17/2019 0432   MCH 22.8 (L) 07/17/2019 0432   MCHC 30.2 07/17/2019 0432   RDW 20.4 (H) 07/17/2019 0432   LYMPHSABS 1.2 07/10/2019 0419   MONOABS 1.0 07/10/2019 0419   EOSABS 0.3 07/10/2019 0419   BASOSABS 0.1 07/10/2019 0419    BMET    Component Value Date/Time   NA 135 07/18/2019 0500   K 3.8 07/18/2019 0500   CL 95 (L) 07/18/2019 0500   CO2 25 07/18/2019 0500   GLUCOSE 96 07/18/2019 0500   BUN 40 (H) 07/18/2019 0500   CREATININE 9.59 (H) 07/18/2019 0500   CALCIUM 8.8 (L) 07/18/2019 0500   GFRNONAA 4 (L) 07/18/2019 0500   GFRAA 5 (L) 07/18/2019 0500     Intake/Output Summary (Last 24 hours) at 07/18/2019 0900 Last data filed at 07/18/2019 6269 Gross per 24 hour  Intake 815.96 ml  Output 2500 ml  Net -1684.04 ml    HOSPITAL MEDICATIONS Scheduled Meds: . aspirin EC  81 mg Oral Daily  . calcitRIOL  0.5 mcg Oral Q MTWThF   And  . calcitRIOL  1 mcg Oral Once per day on Sun Sat  . calcium acetate  1,334 mg Oral TID WC  . Chlorhexidine Gluconate Cloth  6 each Topical Q0600  . Chlorhexidine Gluconate Cloth  6 each Topical Q0600  . gentamicin cream  1 application Topical Daily  . heparin injection (subcutaneous)  5,000 Units Subcutaneous Q8H  . insulin aspart  0-6 Units Subcutaneous TID WC  . insulin glargine  14 Units  Subcutaneous Daily  . labetalol  300 mg Oral BID  . losartan  100 mg Oral Daily  . pantoprazole  40 mg Oral Daily  . vancomycin variable dose per unstable renal function (pharmacist dosing)   Does not apply See admin instructions   Continuous Infusions: . sodium chloride Stopped (07/18/19 0625)  . cefUROXime (ZINACEF)  IV     PRN Meds:.sodium chloride, acetaminophen, hydrALAZINE, ondansetron **OR** ondansetron (ZOFRAN) IV, oxyCODONE-acetaminophen, polyethylene glycol  Assessment:  54 y.o. female with history of end-stage renal disease on peritoneal dialysis admitted secondary to peritonitis, etc.  TDC placed yesterday to initiate hemodialysis.  Vein mapping of the upper extremities is pending   Plan: -Follow-up vein mapping.  Permanent upper extremity hemodialysis access while inpatient versus outpatient.    Risa Grill, PA-C [facility] [phone] 07/18/2019  9:00 AM   I have examined the patient, reviewed and agree with above.  Vein map pending.  Will make recommendations following this study.  Patient will not be on the schedule for tomorrow.  We will plan permanent access next week depending on results of her vein map.  Can be done as an outpatient from vascular surgery standpoint or  inpatient if she is still in house  Curt Jews, MD 07/18/2019 10:17 AM

## 2019-07-18 NOTE — Progress Notes (Signed)
Renal Navigator met with patient at HD bedside to inform her of OP HD seat schedule at [facility], MWF at 10:15am. She states understanding and appreciation. She reports that her husband will provide transportation. She reports that she is "hurting" emotionally to have to go through the PD to HD transition and that she is hopeful that she will be able to return to PD in the future. She understands this need for a modality change right now and is trying to keep her spirits up. Navigator encouraged her to speak with her Nephrologist in the clinic about her goals and to do what is best for her overall health.  Alphonzo Cruise, [place] Renal Navigator [phone]

## 2019-07-18 NOTE — Progress Notes (Addendum)
Progress Note    07/18/2019 9:00 AM   Subjective: Patient seen and evaluated in inpatient hemodialysis unit.  She states improvement in her abdominal fullness.  Treatment via right IJ  Palindrome 19  hemodialysis catheter without apparent complications    Vitals:   07/18/19 0800 07/18/19 0830  BP: (!) 149/76 130/72  Pulse: 69 69  Resp:    Temp:    SpO2:      Physical Exam: Cardiac:  RRR Respiratory: Nonlabored Abdomen:  Soft,, NT  CBC    Component Value Date/Time   WBC 8.7 07/17/2019 0432   RBC 3.25 (L) 07/17/2019 0432   HGB 7.4 (L) 07/17/2019 0432   HCT 24.5 (L) 07/17/2019 0432   PLT 190 07/17/2019 0432   MCV 75.4 (L) 07/17/2019 0432   MCH 22.8 (L) 07/17/2019 0432   MCHC 30.2 07/17/2019 0432   RDW 20.4 (H) 07/17/2019 0432   LYMPHSABS 1.2 07/10/2019 0419   MONOABS 1.0 07/10/2019 0419   EOSABS 0.3 07/10/2019 0419   BASOSABS 0.1 07/10/2019 0419    BMET    Component Value Date/Time   NA 135 07/18/2019 0500   K 3.8 07/18/2019 0500   CL 95 (L) 07/18/2019 0500   CO2 25 07/18/2019 0500   GLUCOSE 96 07/18/2019 0500   BUN 40 (H) 07/18/2019 0500   CREATININE 9.59 (H) 07/18/2019 0500   CALCIUM 8.8 (L) 07/18/2019 0500   GFRNONAA 4 (L) 07/18/2019 0500   GFRAA 5 (L) 07/18/2019 0500     Intake/Output Summary (Last 24 hours) at 07/18/2019 0900 Last data filed at 07/18/2019 6389 Gross per 24 hour  Intake 815.96 ml  Output 2500 ml  Net -1684.04 ml    HOSPITAL MEDICATIONS Scheduled Meds: . aspirin EC  81 mg Oral Daily  . calcitRIOL  0.5 mcg Oral Q MTWThF   And  . calcitRIOL  1 mcg Oral Once per day on Sun Sat  . calcium acetate  1,334 mg Oral TID WC  . Chlorhexidine Gluconate Cloth  6 each Topical Q0600  . Chlorhexidine Gluconate Cloth  6 each Topical Q0600  . gentamicin cream  1 application Topical Daily  . heparin injection (subcutaneous)  5,000 Units Subcutaneous Q8H  . insulin aspart  0-6 Units Subcutaneous TID WC  . insulin glargine  14 Units  Subcutaneous Daily  . labetalol  300 mg Oral BID  . losartan  100 mg Oral Daily  . pantoprazole  40 mg Oral Daily  . vancomycin variable dose per unstable renal function (pharmacist dosing)   Does not apply See admin instructions   Continuous Infusions: . sodium chloride Stopped (07/18/19 0625)  . cefUROXime (ZINACEF)  IV     PRN Meds:.sodium chloride, acetaminophen, hydrALAZINE, ondansetron **OR** ondansetron (ZOFRAN) IV, oxyCODONE-acetaminophen, polyethylene glycol  Assessment:  54 y.o. female with history of end-stage renal disease on peritoneal dialysis admitted secondary to peritonitis, etc.  TDC placed yesterday to initiate hemodialysis.  Vein mapping of the upper extremities is pending   Plan: -Follow-up vein mapping.  Permanent upper extremity hemodialysis access while inpatient versus outpatient.    Risa Grill, PA-C [facility] [phone] 07/18/2019  9:00 AM   I have examined the patient, reviewed and agree with above.  Vein map pending.  Will make recommendations following this study.  Patient will not be on the schedule for tomorrow.  We will plan permanent access next week depending on results of her vein map.  Can be done as an outpatient from vascular surgery standpoint or  inpatient if she is still in house  Curt Jews, MD 07/18/2019 10:17 AM

## 2019-07-18 NOTE — Progress Notes (Addendum)
Bilateral upper extremity vein mapping has been completed. Preliminary results can be found in CV Proc through chart review.  Results were given to the patient's nurse, Anderson Malta.  07/18/19 4:58 PM Carlos Levering RVT

## 2019-07-18 NOTE — Progress Notes (Signed)
[facility] Progress Note   Subjective: Seen on HD, 2nd HD today. SCr fell from 12.56 to 9.59 with one HD treatment. Tolerating HD no C/Os.   Objective Vitals:   07/18/19 0702 07/18/19 0709 07/18/19 0730 07/18/19 0800  BP: (!) 151/67 (!) 148/74 137/71 (!) 149/76  Pulse: 70 70 69 69  Resp: 18     Temp: 98.4 F (36.9 C)     TempSrc: Oral     SpO2: 100%     Weight: 125 kg      Physical Exam General:Overweight woman, well appearing, NAD Heart:RRR; no murmur Lungs:CTA anteriorly Abdomen:soft, non-tender Extremities:2-3+ pitting leg/ hip/ pannus edema Dialysis Access:PD cath in R abdomen, non-tender. RIJ with blood lines connected.   Additional Objective Labs: Basic Metabolic Panel: Recent Labs  Lab 07/16/19 0353 07/17/19 0432 07/18/19 0500  NA 133* 133* 135  K 3.4* 3.5 3.8  CL 91* 94* 95*  CO2 24 23 25   GLUCOSE 165* 165* 96  BUN 62* 60* 40*  CREATININE 12.37* 12.56* 9.59*  CALCIUM 9.1 9.3 8.8*  PHOS 6.9* 6.3* 4.5   Liver Function Tests: Recent Labs  Lab 07/16/19 0353 07/17/19 0432 07/18/19 0500  ALBUMIN 2.1* 2.1* 2.0*   No results for input(s): LIPASE, AMYLASE in the last 168 hours. CBC: Recent Labs  Lab 07/12/19 0345 07/12/19 0345 07/13/19 0902 07/13/19 0902 07/14/19 0553 07/15/19 0413 07/17/19 0432  WBC 7.7   < > 8.0   < > 6.1 6.7 8.7  HGB 7.3*   < > 7.5*   < > 7.8* 7.4* 7.4*  HCT 24.9*   < > 24.8*   < > 26.0* 24.0* 24.5*  MCV 75.9*  --  75.8*  --  75.8* 75.5* 75.4*  PLT 187   < > 175   < > 185 190 190   < > = values in this interval not displayed.   Blood Culture    Component Value Date/Time   SDES PERITONEAL DIALYSATE 07/14/2019 1312   SPECREQUEST Immunocompromised 07/14/2019 1312   CULT FEW STAPHYLOCOCCUS EPIDERMIDIS 07/14/2019 1312   REPTSTATUS 07/17/2019 FINAL 07/14/2019 1312    Cardiac Enzymes: No results for input(s): CKTOTAL, CKMB, CKMBINDEX, TROPONINI in the last 168 hours. CBG: Recent Labs  Lab  07/17/19 0734 07/17/19 1301 07/17/19 1631 07/17/19 2213 07/18/19 0623  GLUCAP 137* 124* 137* 77 112*   Iron Studies: No results for input(s): IRON, TIBC, TRANSFERRIN, FERRITIN in the last 72 hours. @lablastinr3 @ Studies/Results: IR Fluoro Guide CV Line Right  Result Date: 07/17/2019 CLINICAL DATA:  End-stage renal disease. Recent removal of peritoneal dialysis catheter. Hemodialysis access required EXAM: TUNNELED HEMODIALYSIS CATHETER PLACEMENT WITH ULTRASOUND AND FLUOROSCOPIC GUIDANCE TECHNIQUE: The procedure, risks, benefits, and alternatives were explained to the patient. Questions regarding the procedure were encouraged and answered. The patient understands and consents to the procedure. As antibiotic prophylaxis, cefazolin 3 g was ordered pre-procedure and administered intravenously within one hour of incision.Patency of the right IJ vein was confirmed with ultrasound with image documentation. An appropriate skin site was determined. Region was prepped using maximum barrier technique including cap and mask, sterile gown, sterile gloves, large sterile sheet, and Chlorhexidine as cutaneous antisepsis. The region was infiltrated locally with 1% lidocaine. Intravenous Fentanyl 61mcg and Versed 1mg  were administered as conscious sedation during continuous monitoring of the patient's level of consciousness and physiological / cardiorespiratory status by the radiology RN, with a total moderate sedation time of 15 minutes. Under real-time ultrasound guidance, the right IJ vein was accessed with  a 21 gauge micropuncture needle; the needle tip within the vein was confirmed with ultrasound image documentation. Needle exchanged over the 018 guidewire for transitional dilator, which allowed advancement of a Benson wire into the IVC. Over this, an MPA catheter was advanced. A Palindrome 19 hemodialysis catheter was tunneled from the right anterior chest wall approach to the right IJ dermatotomy site. The MPA  catheter was exchanged over an Amplatz wire for serial vascular dilators which allow placement of a peel-away sheath, through which the catheter was advanced under intermittent fluoroscopy, positioned with its tips in the proximal and midright atrium. Spot chest radiograph confirms good catheter position. No pneumothorax. Catheter was flushed and primed per protocol. Catheter secured externally with O Prolene sutures. The right IJ dermatotomy site was closed with Dermabond. COMPLICATIONS: COMPLICATIONS None immediate FLUOROSCOPY TIME:  0.5 minutes; 70 uGym2 DAP COMPARISON:  None IMPRESSION: 1. Technically successful placement of tunneled right IJ hemodialysis catheter with ultrasound and fluoroscopic guidance. Ready for routine use. ACCESS: Remains approachable for percutaneous intervention as needed. Electronically Signed   By: Lucrezia Europe M.D.   On: 07/17/2019 15:43   IR US Guide Vasc Access Right  Result Date: 07/17/2019 CLINICAL DATA:  End-stage renal disease. Recent removal of peritoneal dialysis catheter. Hemodialysis access required EXAM: TUNNELED HEMODIALYSIS CATHETER PLACEMENT WITH ULTRASOUND AND FLUOROSCOPIC GUIDANCE TECHNIQUE: The procedure, risks, benefits, and alternatives were explained to the patient. Questions regarding the procedure were encouraged and answered. The patient understands and consents to the procedure. As antibiotic prophylaxis, cefazolin 3 g was ordered pre-procedure and administered intravenously within one hour of incision.Patency of the right IJ vein was confirmed with ultrasound with image documentation. An appropriate skin site was determined. Region was prepped using maximum barrier technique including cap and mask, sterile gown, sterile gloves, large sterile sheet, and Chlorhexidine as cutaneous antisepsis. The region was infiltrated locally with 1% lidocaine. Intravenous Fentanyl 63mcg and Versed 1mg  were administered as conscious sedation during continuous monitoring of  the patient's level of consciousness and physiological / cardiorespiratory status by the radiology RN, with a total moderate sedation time of 15 minutes. Under real-time ultrasound guidance, the right IJ vein was accessed with a 21 gauge micropuncture needle; the needle tip within the vein was confirmed with ultrasound image documentation. Needle exchanged over the 018 guidewire for transitional dilator, which allowed advancement of a Benson wire into the IVC. Over this, an MPA catheter was advanced. A Palindrome 19 hemodialysis catheter was tunneled from the right anterior chest wall approach to the right IJ dermatotomy site. The MPA catheter was exchanged over an Amplatz wire for serial vascular dilators which allow placement of a peel-away sheath, through which the catheter was advanced under intermittent fluoroscopy, positioned with its tips in the proximal and midright atrium. Spot chest radiograph confirms good catheter position. No pneumothorax. Catheter was flushed and primed per protocol. Catheter secured externally with O Prolene sutures. The right IJ dermatotomy site was closed with Dermabond. COMPLICATIONS: COMPLICATIONS None immediate FLUOROSCOPY TIME:  0.5 minutes; 70 uGym2 DAP COMPARISON:  None IMPRESSION: 1. Technically successful placement of tunneled right IJ hemodialysis catheter with ultrasound and fluoroscopic guidance. Ready for routine use. ACCESS: Remains approachable for percutaneous intervention as needed. Electronically Signed   By: Lucrezia Europe M.D.   On: 07/17/2019 15:43   Medications: . sodium chloride Stopped (07/18/19 0625)  . cefUROXime (ZINACEF)  IV     . aspirin EC  81 mg Oral Daily  . calcitRIOL  0.5 mcg Oral Q MTWThF  And  . calcitRIOL  1 mcg Oral Once per day on Sun Sat  . calcium acetate  1,334 mg Oral TID WC  . Chlorhexidine Gluconate Cloth  6 each Topical Q0600  . Chlorhexidine Gluconate Cloth  6 each Topical Q0600  . gentamicin cream  1 application Topical Daily   . heparin injection (subcutaneous)  5,000 Units Subcutaneous Q8H  . insulin aspart  0-6 Units Subcutaneous TID WC  . insulin glargine  14 Units Subcutaneous Daily  . labetalol  300 mg Oral BID  . losartan  100 mg Oral Daily  . pantoprazole  40 mg Oral Daily  . vancomycin variable dose per unstable renal function (pharmacist dosing)   Does not apply See admin instructions     [email or web address]. CCPD:5 cycles overnight,dwell volume of 2000, combo 2.5%, 4.25%, extraneal last fill.  Assessment: 54 year old black female with ESRD on PD - presenting with symptomatic anemia. However, found to have peritonitis and significant volume overload with low albumin.  Plan: 1. Acute on chronicanemia: Hgb 6.5 on admit - s/p 1U PRBCs. Given course IV iron, s/p ranesp 234mcg [place] on 4/13. Hgb 7.4 today - better. 2. Peritonitis: PD Fluid Cx +MRSE- on Vanc, per pharmacy.No more abdominal pain, resolving. Repeat TNC is down. Cont Vanc 2 wks total.  3. ESRD on CCPD - failed PD w/ nausea and vol overload unresponsive to aggressive PD Rx here. Suspected membrane failure. Pt agrees to proceed w/ HD. [facility] placed 04/21 1st HD 04/21, 2nd tx today. K+3.8. 4. Volume overload - significant issue, HD will help UF  4. Diarrhea(off and on x years):C Diff antigen positive, toxin negative.Completed PO Vanc. 5. Hypertension: vol excess will be addressed w HD 6. Metabolic Bone Disease:CorrCa and Phos slightly high.Continue home calcitriol. Consider changing Phoslo to non-Ca binder if rises any further. 7. Nutrition:Alb low - adding pro-stat supplements. 8. DM:per primary  Jimmye Norman. Donika Butner NP-C 07/18/2019, 8:34 AM  [employer] [phone]

## 2019-07-18 NOTE — Progress Notes (Addendum)
PROGRESS NOTE        PATIENT DETAILS Name: Janet Mitchell Age: 54 y.o. Sex: female Date of Birth: Nov 30, 1965 Admit Date: 07/08/2019 Admitting Physician Darliss Cheney, MD NGE:[id number], Myra Rude, MD  Brief Narrative: Patient is a 54 y.o. female with history of ESRD on PD, HTN, DM-2, chronic diarrhea for the past 2 years-sent to [facility] ED for low hemoglobin/hematocrit and evaluation of worsening abdominal pain.  Found to have a hemoglobin of 6.5 PD catheter associated peritonitis, and anasarca related to poor ultrafiltration with PD.  She was transfused PRBC-started on empiric IV antibiotic-and subsequently adjustments were made with PD-unfortunately she continued to have anasarca-she finally consented to transition to hemodialysis on 4/20.  See below for further details  Significant events: 4/12>> admit to [facility] for severe anemia and PD catheter associated peritonitis 4/21>> HD catheter placed-started HD.  Antimicrobial therapy: Vancomycin: 4/13>> Rocephin: 4/13>>4/15  Microbiology data: 4/13>> peritoneal fluid culture: Staph epidermidis  Procedures : None  Consults: Nephrology IR [facility]VS  DVT Prophylaxis : Prophylactic Heparin   Subjective: Headache is resolved-seen earlier this morning in hemodialysis unit.  Assessment/Plan: Symptomatic anemia: No indication of blood loss-suspect related to underlying CKD.  After being transfused 1 unit of PRBC-and starting IV iron/Aranesp-CBC was stable-hemoglobin back down to 6.8 today-no obvious signs of blood loss.  Transfusing 1 unit of PRBC today.  Follow CBC tomorrow.    PD catheter associated peritonitis: Abdominal pain has improved-exam is benign-peritoneal fluid culture positive for staph epidermidis-continue IV vancomycin-plan on total 2 weeks treatment.  Diarrhea: Chronic issue-do not think she has C. difficile colitis.  She was in fact "constipated" for 2 weeks prior to admission-following which she had around 4-5  bowel movements (baseline) on the day of admission.  There has been no diarrhea since since then.  Suspect C. Difficile studies are are more indicative of colonization rather than infection.  No longer on oral vancomycin.  Per patient-she has seen GI MD in [place] for chronic diarrhea-and at one point was placed on cholestyramine that she stopped taking.  ESRD on PD-transitioned to hemodialysis on 4/21: Failed peritoneal dialysis despite aggressive adjustments to peritoneal dialysis.  Started on HD on 4/21.  Follow volume status-nephrology continues to follow.    Anasarca: Secondary to failed peritoneal dialysis-follow volume status now that she has been started on HD.    Headache: Suspect migraine headaches-responded to IV Depakote-but reoccurred-headaches have resolved after starting HD.  Could have been from uremia/inadequate PD.    HTN: BP better post HD-continue labetalol and losartan for now.  Follow and adjust.  Insulin-dependent DM-2 with episodes of hypoglycemia: CBGs borderline low last night and this morning again-decrease Lantus to 10 units-continue SSI and follow.    Recent Labs    07/17/19 2213 07/18/19 0623 07/18/19 1147  GLUCAP 77 112* 73   GERD: Continue PPI  Obesity: Estimated body mass index is 41.73 kg/m as calculated from the following:   Height as of 05/01/19: 5\' 8"  (1.727 m).   Weight as of this encounter: 124.5 kg.    Diet: Diet Order            Diet regular Room service appropriate? Yes; Fluid consistency: Thin  Diet effective now              Code Status: Full code   Family Communication: Spoke with spouse on 4/21-we will update  tomorrow.  Disposition Plan: Likely home with home health services on discharge  Barriers to Discharge: Peritoneal dialysis catheter associated peritonitis remains on IV vancomycin-anasarca resistant to PD -just transition to HD.  Will need outpatient HD center arranged prior to discharge.  Antimicrobial  agents: Anti-infectives (From admission, onward)   Start     Dose/Rate Route Frequency Ordered Stop   07/18/19 0600  cefUROXime (ZINACEF) 1.5 g in sodium chloride 0.9 % 100 mL IVPB     1.5 g 200 mL/hr over 30 Minutes Intravenous To Surgery 07/17/19 1102 07/19/19 0600   07/17/19 2100  vancomycin (VANCOCIN) IVPB 1000 mg/200 mL premix     1,000 mg 200 mL/hr over 60 Minutes Intravenous  Once 07/17/19 1611 07/18/19 0007   07/17/19 1145  ceFAZolin (ANCEF) 3 g in dextrose 5 % 50 mL IVPB     3 g 100 mL/hr over 30 Minutes Intravenous To Radiology 07/17/19 1129 07/17/19 1900   07/12/19 0845  vancomycin (VANCOREADY) IVPB 2000 mg/400 mL     2,000 mg 200 mL/hr over 120 Minutes Intravenous  Once 07/12/19 0837 07/12/19 1243   07/11/19 1456  vancomycin variable dose per unstable renal function (pharmacist dosing)      Does not apply See admin instructions 07/11/19 1456     07/09/19 1800  vancomycin (VANCOCIN) 50 mg/mL oral solution 125 mg  Status:  Discontinued     125 mg Oral 4 times daily 07/09/19 1614 07/10/19 1546   07/09/19 1700  cefTRIAXone (ROCEPHIN) 2 g in sodium chloride 0.9 % 100 mL IVPB  Status:  Discontinued     2 g 200 mL/hr over 30 Minutes Intravenous Every 24 hours 07/09/19 1614 07/11/19 1451   07/09/19 1400  cefTAZidime (FORTAZ) 2 g in sodium chloride 0.9 % 100 mL IVPB     2 g 200 mL/hr over 30 Minutes Intravenous  Once 07/09/19 1354 07/09/19 1900   07/09/19 1345  vancomycin (VANCOREADY) IVPB 2000 mg/400 mL    Note to Pharmacy: For peritonitis in PD patient   2,000 mg 200 mL/hr over 120 Minutes Intravenous  Once 07/09/19 1336 07/09/19 1851   07/09/19 1345  cefTAZidime (FORTAZ) 2 g in sodium chloride 0.9 % 100 mL IVPB  Status:  Discontinued    Note to Pharmacy: For peritonitis in PD patient   2 g 200 mL/hr over 30 Minutes Intravenous  Once 07/09/19 1336 07/09/19 1354       Time spent: 25 minutes-Greater than 50% of this time was spent in counseling, explanation of diagnosis,  planning of further management, and coordination of care.  MEDICATIONS: Scheduled Meds: . aspirin EC  81 mg Oral Daily  . calcitRIOL  0.5 mcg Oral Q [id number]   And  . calcitRIOL  1 mcg Oral Once per day on Sun Sat  . calcium acetate  1,334 mg Oral TID WC  . Chlorhexidine Gluconate Cloth  6 each Topical Q0600  . Chlorhexidine Gluconate Cloth  6 each Topical Q0600  . furosemide  20 mg Intravenous Once  . gentamicin cream  1 application Topical Daily  . heparin injection (subcutaneous)  5,000 Units Subcutaneous Q8H  . insulin aspart  0-6 Units Subcutaneous TID WC  . insulin glargine  14 Units Subcutaneous Daily  . labetalol  300 mg Oral BID  . losartan  100 mg Oral Daily  . pantoprazole  40 mg Oral Daily  . vancomycin variable dose per unstable renal function (pharmacist dosing)   Does not apply See admin instructions  Continuous Infusions: . sodium chloride Stopped (07/18/19 0625)  . cefUROXime (ZINACEF)  IV     PRN Meds:.sodium chloride, acetaminophen, hydrALAZINE, ondansetron **OR** ondansetron (ZOFRAN) IV, oxyCODONE-acetaminophen, polyethylene glycol   PHYSICAL EXAM: Vital signs: Vitals:   07/18/19 1106 07/18/19 1254 07/18/19 1308 07/18/19 1309  BP: 132/64 (!) 144/73 140/75 140/75  Pulse: 72 72 71 71  Resp:  18 17 17   Temp: 98.2 F (36.8 C) 98.5 F (36.9 C) 98.8 F (37.1 C) 98.8 F (37.1 C)  TempSrc: Oral Oral Oral Oral  SpO2: 97%   97%  Weight:       Filed Weights   07/18/19 0635 07/18/19 0702 07/18/19 1011  Weight: 128.2 kg 125 kg 124.5 kg   Body mass index is 41.73 kg/m.   Gen Exam:Alert awake-not in any distress HEENT:atraumatic, normocephalic Chest: B/L clear to auscultation anteriorly CVS:S1S2 regular Abdomen:soft non tender, non distended Extremities:+ edema Neurology: Non focal Skin: no rash  I have personally reviewed following labs and imaging studies  LABORATORY DATA: CBC: Recent Labs  Lab 07/13/19 0902 07/14/19 0553 07/15/19 0413  07/17/19 0432 07/18/19 0500  WBC 8.0 6.1 6.7 8.7 8.9  HGB 7.5* 7.8* 7.4* 7.4* 6.8*  HCT 24.8* 26.0* 24.0* 24.5* 22.8*  MCV 75.8* 75.8* 75.5* 75.4* 77.6*  PLT 175 185 190 190 784    Basic Metabolic Panel: Recent Labs  Lab 07/14/19 0553 07/15/19 0413 07/16/19 0353 07/17/19 0432 07/18/19 0500  NA 132* 133* 133* 133* 135  K 3.6 3.4* 3.4* 3.5 3.8  CL 90* 89* 91* 94* 95*  CO2 22 22 24 23 25   GLUCOSE 122* 113* 165* 165* 96  BUN 65* 63* 62* 60* 40*  CREATININE 12.60* 12.57* 12.37* 12.56* 9.59*  CALCIUM 9.1 9.1 9.1 9.3 8.8*  PHOS 7.8* 7.3* 6.9* 6.3* 4.5    GFR: Estimated Creatinine Clearance: 9.7 mL/min (A) (by C-G formula based on SCr of 9.59 mg/dL (H)).  Liver Function Tests: Recent Labs  Lab 07/14/19 0553 07/15/19 0413 07/16/19 0353 07/17/19 0432 07/18/19 0500  ALBUMIN 2.3* 2.1* 2.1* 2.1* 2.0*   No results for input(s): LIPASE, AMYLASE in the last 168 hours. No results for input(s): AMMONIA in the last 168 hours.  Coagulation Profile: No results for input(s): INR, PROTIME in the last 168 hours.  Cardiac Enzymes: No results for input(s): CKTOTAL, CKMB, CKMBINDEX, TROPONINI in the last 168 hours.  BNP (last 3 results) No results for input(s): PROBNP in the last 8760 hours.  Lipid Profile: No results for input(s): CHOL, HDL, LDLCALC, TRIG, CHOLHDL, LDLDIRECT in the last 72 hours.  Thyroid Function Tests: No results for input(s): TSH, T4TOTAL, FREET4, T3FREE, THYROIDAB in the last 72 hours.  Anemia Panel: No results for input(s): VITAMINB12, FOLATE, FERRITIN, TIBC, IRON, RETICCTPCT in the last 72 hours.  Urine analysis:    Component Value Date/Time   COLORURINE AMBER (A) 07/31/2018 1800   APPEARANCEUR TURBID (A) 07/31/2018 1800   LABSPEC >1.046 (H) 07/31/2018 1800   PHURINE 5.0 07/31/2018 1800   GLUCOSEU 150 (A) 07/31/2018 1800   HGBUR SMALL (A) 07/31/2018 1800   BILIRUBINUR NEGATIVE 07/31/2018 1800   KETONESUR NEGATIVE 07/31/2018 1800   PROTEINUR >=300  (A) 07/31/2018 1800   NITRITE NEGATIVE 07/31/2018 1800   LEUKOCYTESUR MODERATE (A) 07/31/2018 1800    Sepsis Labs: Lactic Acid, Venous No results found for: LATICACIDVEN  MICROBIOLOGY: Recent Results (from the past 240 hour(s))  SARS CORONAVIRUS 2 (TAT 6-24 HRS) Nasopharyngeal Nasopharyngeal Swab     Status: None   Collection  Time: 07/08/19 10:04 PM   Specimen: Nasopharyngeal Swab  Result Value Ref Range Status   SARS Coronavirus 2 NEGATIVE NEGATIVE Final    Comment: (NOTE) SARS-CoV-2 target nucleic acids are NOT DETECTED. The SARS-CoV-2 RNA is generally detectable in upper and lower respiratory specimens during the acute phase of infection. Negative results do not preclude SARS-CoV-2 infection, do not rule out co-infections with other pathogens, and should not be used as the sole basis for treatment or other patient management decisions. Negative results must be combined with clinical observations, patient history, and epidemiological information. The expected result is Negative. Fact Sheet for Patients: SugarRoll.be Fact Sheet for Healthcare Providers: https://www.woods-mathews.com/ This test is not yet approved or cleared by the [place] FDA and  has been authorized for detection and/or diagnosis of SARS-CoV-2 by FDA under an Emergency Use Authorization (EUA). This EUA will remain  in effect (meaning this test can be used) for the duration of the COVID-19 declaration under Section 56 4(b)(1) of the Act, 21 U.S.C. section 360bbb-3(b)(1), unless the authorization is terminated or revoked sooner. Performed at [facility], [place] [address] [place], [place] [address]   Gram stain     Status: None   Collection Time: 07/09/19  8:55 AM   Specimen: Abdomen; Peritoneal Fluid  Result Value Ref Range Status   Specimen Description ABDOMEN  Final   Special Requests NONE  Final   Gram Stain   Final    WBC PRESENT,BOTH PMN AND  MONONUCLEAR GRAM POSITIVE COCCI CYTOSPIN SMEAR Gram Stain Report Called to,Read Back By and Verified WithKym Groom RN 1700 07/09/19 A BROWNING Performed at [facility], [place] [address] [place],  [address]    Report Status 07/09/2019 FINAL  Final  Culture, body fluid-bottle     Status: Abnormal   Collection Time: 07/09/19  8:55 AM   Specimen: Peritoneal Washings  Result Value Ref Range Status   Specimen Description PERITONEAL ABDOMEN  Final   Special Requests NONE  Final   Gram Stain   Final    GRAM POSITIVE COCCI IN CLUSTERS IN BOTH AEROBIC AND ANAEROBIC BOTTLES CRITICAL RESULT CALLED TO, READ BACK BY AND VERIFIED WITH: Manya Silvas [id number] 07/10/2019 Mena Goes Performed at [facility], [place] [address], [place], [place] [address]    Culture STAPHYLOCOCCUS EPIDERMIDIS (A)  Final   Report Status 07/12/2019 FINAL  Final   Organism ID, Bacteria STAPHYLOCOCCUS EPIDERMIDIS  Final      Susceptibility   Staphylococcus epidermidis - MIC*    CIPROFLOXACIN 4 RESISTANT Resistant     ERYTHROMYCIN >=8 RESISTANT Resistant     GENTAMICIN <=0.5 SENSITIVE Sensitive     OXACILLIN >=4 RESISTANT Resistant     TETRACYCLINE 2 SENSITIVE Sensitive     VANCOMYCIN 1 SENSITIVE Sensitive     TRIMETH/SULFA 20 SENSITIVE Sensitive     CLINDAMYCIN >=8 RESISTANT Resistant     RIFAMPIN <=0.5 SENSITIVE Sensitive     Inducible Clindamycin NEGATIVE Sensitive     * STAPHYLOCOCCUS EPIDERMIDIS  C Difficile Quick Screen w PCR reflex     Status: Abnormal   Collection Time: 07/09/19  2:27 PM   Specimen: STOOL  Result Value Ref Range Status   C Diff antigen POSITIVE (A) NEGATIVE Final   C Diff toxin NEGATIVE NEGATIVE Final   C Diff interpretation Results are indeterminate. See PCR results.  Final    Comment: Performed at [facility], [place] [address], [place],  [address]  C.  Diff by PCR, Reflexed     Status: Abnormal   Collection Time: 07/09/19  2:27 PM  Result Value Ref Range Status    Toxigenic C. Difficile by PCR POSITIVE (A) NEGATIVE Final    Comment: Positive for toxigenic C. difficile with little to no toxin production. Only treat if clinical presentation suggests symptomatic illness. Performed at [facility], [place] [address], [place], [place] [address]   Body fluid culture     Status: None   Collection Time: 07/14/19  1:12 PM   Specimen: Peritoneal Dialysate; Body Fluid  Result Value Ref Range Status   Specimen Description PERITONEAL DIALYSATE  Final   Special Requests Immunocompromised  Final   Gram Stain   Final    NO WBC SEEN NO ORGANISMS SEEN Performed at [facility], [address], [place], [place] [address]    Culture FEW STAPHYLOCOCCUS EPIDERMIDIS  Final   Report Status 07/17/2019 FINAL  Final   Organism ID, Bacteria STAPHYLOCOCCUS EPIDERMIDIS  Final      Susceptibility   Staphylococcus epidermidis - MIC*    CIPROFLOXACIN 4 RESISTANT Resistant     ERYTHROMYCIN >=8 RESISTANT Resistant     GENTAMICIN <=0.5 SENSITIVE Sensitive     OXACILLIN >=4 RESISTANT Resistant     TETRACYCLINE 2 SENSITIVE Sensitive     VANCOMYCIN 1 SENSITIVE Sensitive     TRIMETH/SULFA 20 SENSITIVE Sensitive     CLINDAMYCIN >=8 RESISTANT Resistant     RIFAMPIN <=0.5 SENSITIVE Sensitive     Inducible Clindamycin NEGATIVE Sensitive     * FEW STAPHYLOCOCCUS EPIDERMIDIS  Surgical PCR screen     Status: None   Collection Time: 07/17/19 10:03 AM   Specimen: Nasal Mucosa; Nasal Swab  Result Value Ref Range Status   MRSA, PCR NEGATIVE NEGATIVE Final   Staphylococcus aureus NEGATIVE NEGATIVE Final    Comment: (NOTE) The Xpert SA Assay (FDA approved for NASAL specimens in patients 73 years of age and older), is one component of a comprehensive surveillance program. It is not intended to diagnose infection nor to guide or monitor treatment. Performed at [facility], [place] [address] [place], [place] [address]     RADIOLOGY STUDIES/RESULTS: IR Fluoro Guide CV  Line Right  Result Date: 07/17/2019 CLINICAL DATA:  End-stage renal disease. Recent removal of peritoneal dialysis catheter. Hemodialysis access required EXAM: TUNNELED HEMODIALYSIS CATHETER PLACEMENT WITH ULTRASOUND AND FLUOROSCOPIC GUIDANCE TECHNIQUE: The procedure, risks, benefits, and alternatives were explained to the patient. Questions regarding the procedure were encouraged and answered. The patient understands and consents to the procedure. As antibiotic prophylaxis, cefazolin 3 g was ordered pre-procedure and administered intravenously within one hour of incision.Patency of the right IJ vein was confirmed with ultrasound with image documentation. An appropriate skin site was determined. Region was prepped using maximum barrier technique including cap and mask, sterile gown, sterile gloves, large sterile sheet, and Chlorhexidine as cutaneous antisepsis. The region was infiltrated locally with 1% lidocaine. Intravenous Fentanyl 67mcg and Versed 1mg  were administered as conscious sedation during continuous monitoring of the patient's level of consciousness and physiological / cardiorespiratory status by the radiology RN, with a total moderate sedation time of 15 minutes. Under real-time ultrasound guidance, the right IJ vein was accessed with a 21 gauge micropuncture needle; the needle tip within the vein was confirmed with ultrasound image documentation. Needle exchanged over the 018 guidewire for transitional dilator, which allowed advancement of a Benson wire into the IVC. Over this, an MPA catheter was  advanced. A Palindrome 19 hemodialysis catheter was tunneled from the right anterior chest wall approach to the right IJ dermatotomy site. The MPA catheter was exchanged over an Amplatz wire for serial vascular dilators which allow placement of a peel-away sheath, through which the catheter was advanced under intermittent fluoroscopy, positioned with its tips in the proximal and midright atrium. Spot chest  radiograph confirms good catheter position. No pneumothorax. Catheter was flushed and primed per protocol. Catheter secured externally with O Prolene sutures. The right IJ dermatotomy site was closed with Dermabond. COMPLICATIONS: COMPLICATIONS None immediate FLUOROSCOPY TIME:  0.5 minutes; 70 uGym2 DAP COMPARISON:  None IMPRESSION: 1. Technically successful placement of tunneled right IJ hemodialysis catheter with ultrasound and fluoroscopic guidance. Ready for routine use. ACCESS: Remains approachable for percutaneous intervention as needed. Electronically Signed   By: Lucrezia Europe M.D.   On: 07/17/2019 15:43   IR US Guide Vasc Access Right  Result Date: 07/17/2019 CLINICAL DATA:  End-stage renal disease. Recent removal of peritoneal dialysis catheter. Hemodialysis access required EXAM: TUNNELED HEMODIALYSIS CATHETER PLACEMENT WITH ULTRASOUND AND FLUOROSCOPIC GUIDANCE TECHNIQUE: The procedure, risks, benefits, and alternatives were explained to the patient. Questions regarding the procedure were encouraged and answered. The patient understands and consents to the procedure. As antibiotic prophylaxis, cefazolin 3 g was ordered pre-procedure and administered intravenously within one hour of incision.Patency of the right IJ vein was confirmed with ultrasound with image documentation. An appropriate skin site was determined. Region was prepped using maximum barrier technique including cap and mask, sterile gown, sterile gloves, large sterile sheet, and Chlorhexidine as cutaneous antisepsis. The region was infiltrated locally with 1% lidocaine. Intravenous Fentanyl 3mcg and Versed 1mg  were administered as conscious sedation during continuous monitoring of the patient's level of consciousness and physiological / cardiorespiratory status by the radiology RN, with a total moderate sedation time of 15 minutes. Under real-time ultrasound guidance, the right IJ vein was accessed with a 21 gauge micropuncture needle; the  needle tip within the vein was confirmed with ultrasound image documentation. Needle exchanged over the 018 guidewire for transitional dilator, which allowed advancement of a Benson wire into the IVC. Over this, an MPA catheter was advanced. A Palindrome 19 hemodialysis catheter was tunneled from the right anterior chest wall approach to the right IJ dermatotomy site. The MPA catheter was exchanged over an Amplatz wire for serial vascular dilators which allow placement of a peel-away sheath, through which the catheter was advanced under intermittent fluoroscopy, positioned with its tips in the proximal and midright atrium. Spot chest radiograph confirms good catheter position. No pneumothorax. Catheter was flushed and primed per protocol. Catheter secured externally with O Prolene sutures. The right IJ dermatotomy site was closed with Dermabond. COMPLICATIONS: COMPLICATIONS None immediate FLUOROSCOPY TIME:  0.5 minutes; 70 uGym2 DAP COMPARISON:  None IMPRESSION: 1. Technically successful placement of tunneled right IJ hemodialysis catheter with ultrasound and fluoroscopic guidance. Ready for routine use. ACCESS: Remains approachable for percutaneous intervention as needed. Electronically Signed   By: Lucrezia Europe M.D.   On: 07/17/2019 15:43     LOS: 9 days   Oren Binet, MD  [facility]    To contact the attending provider between 7A-7P or the covering provider during after hours 7P-7A, please log into the web site [email or web address] and access using universal [place] password for that web site. If you do not have the password, please call the hospital operator.  07/18/2019, 1:59 PM

## 2019-07-19 ENCOUNTER — Other Ambulatory Visit (HOSPITAL_COMMUNITY): Payer: Medicare Other

## 2019-07-19 ENCOUNTER — Other Ambulatory Visit: Payer: Self-pay

## 2019-07-19 ENCOUNTER — Encounter (HOSPITAL_COMMUNITY): Payer: Self-pay | Admitting: Vascular Surgery

## 2019-07-19 LAB — CBC
HCT: 28.6 % — ABNORMAL LOW (ref 36.0–46.0)
Hemoglobin: 8.5 g/dL — ABNORMAL LOW (ref 12.0–15.0)
MCH: 23.3 pg — ABNORMAL LOW (ref 26.0–34.0)
MCHC: 29.7 g/dL — ABNORMAL LOW (ref 30.0–36.0)
MCV: 78.4 fL — ABNORMAL LOW (ref 80.0–100.0)
Platelets: 170 10*3/uL (ref 150–400)
RBC: 3.65 MIL/uL — ABNORMAL LOW (ref 3.87–5.11)
RDW: 21.2 % — ABNORMAL HIGH (ref 11.5–15.5)
WBC: 9 10*3/uL (ref 4.0–10.5)
nRBC: 0 % (ref 0.0–0.2)

## 2019-07-19 LAB — RENAL FUNCTION PANEL
Albumin: 2.3 g/dL — ABNORMAL LOW (ref 3.5–5.0)
Anion gap: 11 (ref 5–15)
BUN: 28 mg/dL — ABNORMAL HIGH (ref 6–20)
CO2: 27 mmol/L (ref 22–32)
Calcium: 9.5 mg/dL (ref 8.9–10.3)
Chloride: 98 mmol/L (ref 98–111)
Creatinine, Ser: 7.05 mg/dL — ABNORMAL HIGH (ref 0.44–1.00)
GFR calc Af Amer: 7 mL/min — ABNORMAL LOW (ref >60)
GFR calc non Af Amer: 6 mL/min — ABNORMAL LOW (ref >60)
Glucose, Bld: 140 mg/dL — ABNORMAL HIGH (ref 70–99)
Phosphorus: 4 mg/dL (ref 2.5–4.6)
Potassium: 4.2 mmol/L (ref 3.5–5.1)
Sodium: 136 mmol/L (ref 135–145)

## 2019-07-19 LAB — GLUCOSE, CAPILLARY
Glucose-Capillary: 116 mg/dL — ABNORMAL HIGH (ref 70–99)
Glucose-Capillary: 172 mg/dL — ABNORMAL HIGH (ref 70–99)

## 2019-07-19 MED ORDER — SODIUM CHLORIDE 0.9 % IV SOLN: 1.5000 g | INTRAVENOUS | Status: DC

## 2019-07-19 MED ORDER — TOUJEO MAX SOLOSTAR 300 UNIT/ML ~~LOC~~ SOPN: 10.0000 [IU] | PEN_INJECTOR | Freq: Every day | SUBCUTANEOUS | Status: DC

## 2019-07-19 MED ORDER — VANCOMYCIN HCL 750 MG/150ML IV SOLN
750.0000 mg | Freq: Once | INTRAVENOUS | Status: AC
  Administered 2019-07-19: 750 mg via INTRAVENOUS
  Filled 2019-07-19: qty 150

## 2019-07-19 NOTE — Progress Notes (Signed)
Pharmacy Antibiotic Note  Janet Mitchell is a 55 y.o. female admitted on 07/08/2019 with peritonitis.  Pharmacy has been consulted for Vancomycin dosing.   Staph epidermidis in culture (oxacillin resistant/ MRSE)   Switched from CCPD to HD.  TDC placed by IR on 4/21 > HD for 2.5 hrs on 4/21 and 3 hrs on 4/22, BFR 400   Intermittent Vanc dosing:  2 gm IV given on 4/13.    - random level 10 mcg/ml on 4/16 > 2 gm IV repeated.   - random level 31 mcg/ml on 4/17   - estimated vanc level would drop to ~15 mcg/ml after HD 4/21   - Vancomycin 1gm IV given post-HD on 4/21 pm   - Random Vanc level 4/22 after HD (rather than pre-HD) = 24 mcg/ml. Higher than anticipated.     - Planning discharge today, requested to give Vancomycin dose to cover until next HD, planned for Tuesday 4/27.  Estimate level today ~21 mcg/ml.  Plan:  Vancomycin 750 mg IV x 1 today.  Day # 11 Vancomycin, planning 14 days.   Height: 68 inches Ideal body weight: 63.9 kg Weight: 125.4 kg (276 lb 7.3 oz)  Temp (24hrs), Avg:98.7 F (37.1 C), Min:98.1 F (36.7 C), Max:99.1 F (37.3 C)  Recent Labs  Lab 07/13/19 0902 07/13/19 0902 07/14/19 0553 07/14/19 0553 07/15/19 0413 07/16/19 0353 07/17/19 0432 07/18/19 0500 07/18/19 1108 07/19/19 0638  WBC 8.0   < > 6.1  --  6.7  --  8.7 8.9  --  9.0  CREATININE 12.23*   < > 12.60*   < > 12.57* 12.37* 12.56* 9.59*  --  7.05*  VANCORANDOM 31  --   --   --   --   --   --   --  24  --    < > = values in this interval not displayed.     No Known Allergies  Antimicrobials this admission: Vancomycin 4/13 >>  Ceftazidime x 1 on 4/13 Ceftriaxone 4/13>>4/15 (got 2 doses) Vancomycin oral 4/13>>4/14  Microbiology results: 4/13: Peritoneal washings: Staph epi - R oxa, cipro, clinda  - sens Gent MIC<0.5, TCN, Vanc MIC 1, Septra 4/18: peritoneal dialysate: rare Staph epi - same sensitivities 4/13: Cdiff: POSITIVE 4/12: COVID negative  Thank you for allowing pharmacy to  be a part of this patient's care.  Arty Baumgartner, [place] Phone: [phone] 07/19/2019 12:08 PM

## 2019-07-19 NOTE — Progress Notes (Signed)
Received message from Dr. Jonnie Finner that patient needs OP HD treatment changed next week from MWF to TWF because patient is scheduled for permanent access on Monday. OP HD clinic/[facility] is able to accommodate. She needs to arrive on Tuesday at 10:15am to complete paperwork due to modality change before her first in-center HD treatment at 10:45am.  Renal Navigator then received information from Dr. Jonnie Finner that patient is not already on the OR schedule with [facility] for Monday, but that he has contacted [facility] that Monday and Thursday of next week are her best options for Thursday and that we will leave her OP HD as we have it rescheduled (TWF).  Navigator met with patient at bedside and relayed all of the above information. She was understanding and stated agreement. She states no questions or concerns. Navigator faxed discharge records to OP HD clinic.  Alphonzo Cruise, [place] Renal Navigator [phone]

## 2019-07-19 NOTE — Progress Notes (Signed)
[facility] Progress Note   Subjective: Seen in room, feeling well, wants to go home  Objective Vitals:   07/18/19 1600 07/18/19 2131 07/19/19 0440 07/19/19 0504  BP: 140/68 (!) 115/56    Pulse: 70 72  74  Resp: 18 17    Temp: 98.1 F (36.7 C) 99.1 F (37.3 C)    TempSrc: Oral Oral    SpO2: 95% 93%  96%  Weight:   125.4 kg 125.4 kg   Physical Exam General:Overweight woman, well appearing, NAD Heart:RRR; no murmur Lungs:CTA anteriorly Abdomen:soft, non-tender Extremities:2-3+ pitting leg/ hip/ pannus edema Dialysis Access:PD cath in R abdomen, non-tender. RIJ with blood lines connected.   Additional Objective Labs: Basic Metabolic Panel: Recent Labs  Lab 07/17/19 0432 07/18/19 0500 07/19/19 0638  NA 133* 135 136  K 3.5 3.8 4.2  CL 94* 95* 98  CO2 23 25 27   GLUCOSE 165* 96 140*  BUN 60* 40* 28*  CREATININE 12.56* 9.59* 7.05*  CALCIUM 9.3 8.8* 9.5  PHOS 6.3* 4.5 4.0   Liver Function Tests: Recent Labs  Lab 07/17/19 0432 07/18/19 0500 07/19/19 0638  ALBUMIN 2.1* 2.0* 2.3*   No results for input(s): LIPASE, AMYLASE in the last 168 hours. CBC: Recent Labs  Lab 07/14/19 0553 07/14/19 0553 07/15/19 0413 07/15/19 0413 07/17/19 0432 07/18/19 0500 07/19/19 0638  WBC 6.1   < > 6.7   < > 8.7 8.9 9.0  HGB 7.8*   < > 7.4*   < > 7.4* 6.8* 8.5*  HCT 26.0*   < > 24.0*   < > 24.5* 22.8* 28.6*  MCV 75.8*  --  75.5*  --  75.4* 77.6* 78.4*  PLT 185   < > 190   < > 190 164 170   < > = values in this interval not displayed.   Blood Culture    Component Value Date/Time   SDES PERITONEAL DIALYSATE 07/14/2019 1312   SPECREQUEST Immunocompromised 07/14/2019 1312   CULT FEW STAPHYLOCOCCUS EPIDERMIDIS 07/14/2019 1312   REPTSTATUS 07/17/2019 FINAL 07/14/2019 1312    Cardiac Enzymes: No results for input(s): CKTOTAL, CKMB, CKMBINDEX, TROPONINI in the last 168 hours. CBG: Recent Labs  Lab 07/18/19 1147 07/18/19 1655 07/18/19 2131  07/19/19 0742 07/19/19 1144  GLUCAP 73 97 167* 116* 172*   Iron Studies: No results for input(s): IRON, TIBC, TRANSFERRIN, FERRITIN in the last 72 hours. @lablastinr3 @ Studies/Results: IR Fluoro Guide CV Line Right  Result Date: 07/17/2019 CLINICAL DATA:  End-stage renal disease. Recent removal of peritoneal dialysis catheter. Hemodialysis access required EXAM: TUNNELED HEMODIALYSIS CATHETER PLACEMENT WITH ULTRASOUND AND FLUOROSCOPIC GUIDANCE TECHNIQUE: The procedure, risks, benefits, and alternatives were explained to the patient. Questions regarding the procedure were encouraged and answered. The patient understands and consents to the procedure. As antibiotic prophylaxis, cefazolin 3 g was ordered pre-procedure and administered intravenously within one hour of incision.Patency of the right IJ vein was confirmed with ultrasound with image documentation. An appropriate skin site was determined. Region was prepped using maximum barrier technique including cap and mask, sterile gown, sterile gloves, large sterile sheet, and Chlorhexidine as cutaneous antisepsis. The region was infiltrated locally with 1% lidocaine. Intravenous Fentanyl 82mcg and Versed 1mg  were administered as conscious sedation during continuous monitoring of the patient's level of consciousness and physiological / cardiorespiratory status by the radiology RN, with a total moderate sedation time of 15 minutes. Under real-time ultrasound guidance, the right IJ vein was accessed with a 21 gauge micropuncture needle; the needle tip within the  vein was confirmed with ultrasound image documentation. Needle exchanged over the 018 guidewire for transitional dilator, which allowed advancement of a Benson wire into the IVC. Over this, an MPA catheter was advanced. A Palindrome 19 hemodialysis catheter was tunneled from the right anterior chest wall approach to the right IJ dermatotomy site. The MPA catheter was exchanged over an Amplatz wire for  serial vascular dilators which allow placement of a peel-away sheath, through which the catheter was advanced under intermittent fluoroscopy, positioned with its tips in the proximal and midright atrium. Spot chest radiograph confirms good catheter position. No pneumothorax. Catheter was flushed and primed per protocol. Catheter secured externally with O Prolene sutures. The right IJ dermatotomy site was closed with Dermabond. COMPLICATIONS: COMPLICATIONS None immediate FLUOROSCOPY TIME:  0.5 minutes; 70 uGym2 DAP COMPARISON:  None IMPRESSION: 1. Technically successful placement of tunneled right IJ hemodialysis catheter with ultrasound and fluoroscopic guidance. Ready for routine use. ACCESS: Remains approachable for percutaneous intervention as needed. Electronically Signed   By: Lucrezia Europe M.D.   On: 07/17/2019 15:43   IR US Guide Vasc Access Right  Result Date: 07/17/2019 CLINICAL DATA:  End-stage renal disease. Recent removal of peritoneal dialysis catheter. Hemodialysis access required EXAM: TUNNELED HEMODIALYSIS CATHETER PLACEMENT WITH ULTRASOUND AND FLUOROSCOPIC GUIDANCE TECHNIQUE: The procedure, risks, benefits, and alternatives were explained to the patient. Questions regarding the procedure were encouraged and answered. The patient understands and consents to the procedure. As antibiotic prophylaxis, cefazolin 3 g was ordered pre-procedure and administered intravenously within one hour of incision.Patency of the right IJ vein was confirmed with ultrasound with image documentation. An appropriate skin site was determined. Region was prepped using maximum barrier technique including cap and mask, sterile gown, sterile gloves, large sterile sheet, and Chlorhexidine as cutaneous antisepsis. The region was infiltrated locally with 1% lidocaine. Intravenous Fentanyl 24mcg and Versed 1mg  were administered as conscious sedation during continuous monitoring of the patient's level of consciousness and  physiological / cardiorespiratory status by the radiology RN, with a total moderate sedation time of 15 minutes. Under real-time ultrasound guidance, the right IJ vein was accessed with a 21 gauge micropuncture needle; the needle tip within the vein was confirmed with ultrasound image documentation. Needle exchanged over the 018 guidewire for transitional dilator, which allowed advancement of a Benson wire into the IVC. Over this, an MPA catheter was advanced. A Palindrome 19 hemodialysis catheter was tunneled from the right anterior chest wall approach to the right IJ dermatotomy site. The MPA catheter was exchanged over an Amplatz wire for serial vascular dilators which allow placement of a peel-away sheath, through which the catheter was advanced under intermittent fluoroscopy, positioned with its tips in the proximal and midright atrium. Spot chest radiograph confirms good catheter position. No pneumothorax. Catheter was flushed and primed per protocol. Catheter secured externally with O Prolene sutures. The right IJ dermatotomy site was closed with Dermabond. COMPLICATIONS: COMPLICATIONS None immediate FLUOROSCOPY TIME:  0.5 minutes; 70 uGym2 DAP COMPARISON:  None IMPRESSION: 1. Technically successful placement of tunneled right IJ hemodialysis catheter with ultrasound and fluoroscopic guidance. Ready for routine use. ACCESS: Remains approachable for percutaneous intervention as needed. Electronically Signed   By: Lucrezia Europe M.D.   On: 07/17/2019 15:43   VAS [place] UPPER EXT VEIN MAPPING (PRE-OP AVF)  Result Date: 07/18/2019 UPPER EXTREMITY VEIN MAPPING  Indications: Pre-access. Comparison Study: No prior studies. Performing Technologist: Oliver Hum RVT  Examination Guidelines: A complete evaluation includes B-mode imaging, spectral Doppler, color Doppler, and power  Doppler as needed of all accessible portions of each vessel. Bilateral testing is considered an integral part of a complete examination.  Limited examinations for reoccurring indications may be performed as noted. +-----------------+-------------+----------+--------------+ Right Cephalic   Diameter (cm)Depth (cm)   Findings    +-----------------+-------------+----------+--------------+ Shoulder             0.32        1.30                  +-----------------+-------------+----------+--------------+ Prox upper arm       0.52        0.75     branching    +-----------------+-------------+----------+--------------+ Mid upper arm        0.48        0.93                  +-----------------+-------------+----------+--------------+ Dist upper arm       0.36        0.49     branching    +-----------------+-------------+----------+--------------+ Antecubital fossa    0.52        0.23      Thrombus    +-----------------+-------------+----------+--------------+ Prox forearm         0.29        0.75     branching    +-----------------+-------------+----------+--------------+ Mid forearm          0.31        0.49                  +-----------------+-------------+----------+--------------+ Dist forearm                            not visualized +-----------------+-------------+----------+--------------+ +-----------------+-------------+----------+---------+ Right Basilic    Diameter (cm)Depth (cm)Findings  +-----------------+-------------+----------+---------+ Shoulder             0.58        1.30             +-----------------+-------------+----------+---------+ Mid upper arm        0.40        1.50             +-----------------+-------------+----------+---------+ Dist upper arm       0.52        1.10             +-----------------+-------------+----------+---------+ Antecubital fossa    0.27        0.74   branching +-----------------+-------------+----------+---------+ Prox forearm         0.23        0.43             +-----------------+-------------+----------+---------+ Mid forearm           0.26        0.17             +-----------------+-------------+----------+---------+ Distal forearm       0.29        0.14             +-----------------+-------------+----------+---------+ +-----------------+-------------+----------+----------------------+ Left Cephalic    Diameter (cm)Depth (cm)       Findings        +-----------------+-------------+----------+----------------------+ Shoulder             0.33        0.87                          +-----------------+-------------+----------+----------------------+ Prox upper arm  0.26        1.30         branching        +-----------------+-------------+----------+----------------------+ Mid upper arm        0.24        0.98                          +-----------------+-------------+----------+----------------------+ Dist upper arm       0.21        0.60         branching        +-----------------+-------------+----------+----------------------+ Antecubital fossa    0.32        0.28                          +-----------------+-------------+----------+----------------------+ Prox forearm         0.49        0.84   branching and Thrombus +-----------------+-------------+----------+----------------------+ Mid forearm                                 not visualized     +-----------------+-------------+----------+----------------------+ Dist forearm         0.21        0.31                          +-----------------+-------------+----------+----------------------+ +-----------------+-------------+----------+---------+ Left Basilic     Diameter (cm)Depth (cm)Findings  +-----------------+-------------+----------+---------+ Shoulder             0.68        1.50             +-----------------+-------------+----------+---------+ Mid upper arm        0.46        1.40             +-----------------+-------------+----------+---------+ Dist upper arm       0.57        1.20              +-----------------+-------------+----------+---------+ Antecubital fossa    0.43        0.68   branching +-----------------+-------------+----------+---------+ Prox forearm         0.41        0.55   branching +-----------------+-------------+----------+---------+ Mid forearm          0.33        0.60   branching +-----------------+-------------+----------+---------+ Distal forearm       0.24        0.44             +-----------------+-------------+----------+---------+ *See table(s) above for measurements and observations.  Diagnosing physician: Monica Martinez MD Electronically signed by Monica Martinez MD on 07/18/2019 at 8:13:03 PM.    Final    Medications: . sodium chloride Stopped (07/18/19 9767)  . [START ON 07/22/2019] cefUROXime (ZINACEF)  IV     . aspirin EC  81 mg Oral Daily  . calcitRIOL  0.5 mcg Oral Q MTWThF   And  . calcitRIOL  1 mcg Oral Once per day on Sun Sat  . calcium acetate  1,334 mg Oral TID WC  . Chlorhexidine Gluconate Cloth  6 each Topical Q0600  . Chlorhexidine Gluconate Cloth  6 each Topical Q0600  . gentamicin cream  1 application Topical Daily  . heparin injection (subcutaneous)  5,000 Units Subcutaneous Q8H  . insulin aspart  0-6 Units Subcutaneous TID WC  . insulin glargine  10 Units Subcutaneous Daily  . labetalol  300 mg Oral BID  . losartan  100 mg Oral Daily  . pantoprazole  40 mg Oral Daily  . vancomycin variable dose per unstable renal function (pharmacist dosing)   Does not apply See admin instructions     [facility] CCPD:5 cycles overnight,dwell volume of 2000, combo 2.5%, 4.25%, extraneal last fill.  Assessment: 54 year old black female with ESRD on PD - presenting with symptomatic anemia. However, found to have peritonitis and significant volume overload with low albumin.  Plan: 1. Peritonitis: PD Fluid Cx +MRSE- on Vanc, per pharmacy.No more abdominal pain,  resolving. Repeat TNC was corrected. Cont Vanc for 3 wks total, I have spoken to staff at [facility] she will get post HD IV vanc thru next Friday 4/30.  2. ESRD on CCPD - pt has failure of PD w/ nausea / uremia and vol overload unresponsive to aggressive PD Rx here. Suspected membrane failure. Pt agreed to proceed w/ HD. [place] placed 4/21 and she had HD x 2 here w/ good response and correction of azotemia , nausea and HA's. She is on schedule for a permanent access as OP this Monday per [facility].  She will do HD Tues-Wed-Fri next wk due to the surgery, and MWF thereafter.  OK for dc today.  3. Volume overload - significant issue, resolving w/ HD 4. Acute on chronicanemia: Hgb 6.5 on admit - s/p 1U PRBCs. Given course IV iron, s/p ranesp 243mcg [place] on 4/13. Hgb improved 4. Diarrhea(off and on x years):C Diff antigen positive, toxin negative.Completed PO Vanc. 5. Hypertension: vol excess will be addressed w HD 6. Metabolic Bone Disease:CorrCa and Phos slightly high.Continue home calcitriol. Consider changing Phoslo to non-Ca binder if rises any further 7. DM:per primary  Kelly Splinter, MD 07/19/2019, 12:09 PM

## 2019-07-19 NOTE — Progress Notes (Signed)
Physical Therapy Treatment Patient Details Name: Janet Mitchell MRN: [id number] DOB: [birth year] Today's Date: 07/19/2019    History of Present Illness 54 year old female with history of ESRD on PD, HTN, DM-2, chronic diarrhea for the past 2 years-sent to [facility] ED for low hemoglobin/hematocrit and evaluation of worsening abdominal pain.  Found to have a hemoglobin of 6.5 and PD catheter associated peritonitis. Nephrology following and recommending HD. HD started 07/17/19.    PT Comments    Patient limited by pain from swelling during session. She tolerated ambulated in room with RW and one assist. Patient expressing frustration with care yesterday, declined wanting to speak to nurse manager. Patient reports difficulty with stair negotiation prior to admission and that she has been trying to get a ramp for the steps to enter her home but has been unsuccessful. PT to reach out to CM and SW for resources on ramps as patient does not have the strength, balance, or mobility needed to safely negotiate a step at this time. Patient confirms her husband is there to assist her upon discharge home.    Follow Up Recommendations  Home health PT;Supervision/Assistance - 24 hour     Equipment Recommendations  Other (comment)(ramp to enter (patient owns RW and [facility]))       Precautions / Restrictions Precautions Precautions: Fall;Other (comment) Precaution Comments: LUE restricted extremity Restrictions Weight Bearing Restrictions: No    Mobility  Bed Mobility Overal bed mobility: Modified Independent    General bed mobility comments: HOB elevated approx 30 degrees, use of bedrail  Transfers Overall transfer level: Needs assistance Equipment used: Rolling walker (2 wheeled) Transfers: Sit to/from Stand Sit to Stand: Min guard;From elevated surface Stand pivot transfers: Min guard;Min assist;From elevated surface       General transfer comment: sit<>stand from EOB trial 1 with bed height  increased to height of patient's bed at home. Stand-step transfer bed>chair trial 2.  Ambulation/Gait Ambulation/Gait assistance: Min guard Gait Distance (Feet): 30 Feet Assistive device: Rolling walker (2 wheeled) Gait Pattern/deviations: Decreased dorsiflexion - right;Decreased dorsiflexion - left(increased stance time and L lateral lean with L stance) Gait velocity: decreased   General Gait Details: absent heel strike bilaterally, increased hip and knee flexion + lateral lean to clear feet during swing phase   Stairs Not safe to attempt at this time    Balance Overall balance assessment: Needs assistance Sitting-balance support: Feet supported Sitting balance-Leahy Scale: Good     Standing balance support: Bilateral upper extremity supported   Standing balance comment: Static standing with RW and supervision, contact guard with RW dynamic balance    Cognition Arousal/Alertness: Awake/alert Behavior During Therapy: (frustrated) Overall Cognitive Status: Within Functional Limits for tasks assessed  General Comments: Patient expressing she wants to go home where her husband is there to help her all the time.      Exercises General Exercises - Lower Extremity Ankle Circles/Pumps: 5 reps;Both;AROM;Seated(education and demo of ankle pumps) Long Arc Quad: (education and demo of LAQ)    General Comments General comments (skin integrity, edema, etc.): Patient's pain and swelling limited her mobility.      Pertinent Vitals/Pain Pain Assessment: 0-10 Pain Score: 6  Pain Location: L hip/back from swelling Pain Intervention(s): Monitored during session;Repositioned;Limited activity within patient's tolerance;Other (comment)(nurse informed)           PT Goals (current goals can now be found in the care plan section) Acute Rehab PT Goals Patient Stated Goal: to go home Progress towards PT goals: Progressing  toward goals    Frequency    Min 3X/week      PT Plan  Current plan remains appropriate       AM-PAC PT "6 Clicks" Mobility   Outcome Measure  Help needed turning from your back to your side while in a flat bed without using bedrails?: None Help needed moving from lying on your back to sitting on the side of a flat bed without using bedrails?: None Help needed moving to and from a bed to a chair (including a wheelchair)?: A Little Help needed standing up from a chair using your arms (e.g., wheelchair or bedside chair)?: A Little Help needed to walk in hospital room?: A Little Help needed climbing 3-5 steps with a railing? : A Lot 6 Click Score: 19    End of Session   Activity Tolerance: Patient limited by fatigue;Patient limited by pain Patient left: in chair;with call bell/phone within reach Nurse Communication: Mobility status PT Visit Diagnosis: Unsteadiness on feet (R26.81);History of falling (Z91.81);Muscle weakness (generalized) (M62.81);Difficulty in walking, not elsewhere classified (R26.2)     Time: 1164-3539 PT Time Calculation (min) (ACUTE ONLY): 28 min  Charges:  $Therapeutic Activity: 23-37 mins                     Birdie Hopes, PT, DPT Acute Rehab [phone] office    Birdie Hopes 07/19/2019, 1:37 PM

## 2019-07-19 NOTE — Discharge Summary (Signed)
PATIENT DETAILS Name: Janet Mitchell Age: 54 y.o. Sex: female Date of Birth: 11/20/1965 MRN: [id number]. Admitting Physician: Darliss Cheney, MD PPI:RJJOA, Myra Rude, MD  Admit Date: 07/08/2019 Discharge date: 07/19/2019  Recommendations for Outpatient Follow-up:  1. Follow up with PCP in 1-2 weeks 2. Please obtain CMP/CBC in one week 3. Ensure follow up with vascular surgery  Admitted From:  Home  Disposition: Home with home health [place]: Yes  Equipment/Devices: None  Discharge Condition: Stable  CODE STATUS: FULL CODE  Diet recommendation:  Diet Order            Diet NPO time specified Except for: Sips with Meds  Diet effective midnight        Diet - low sodium heart healthy        Diet Carb Modified        Diet regular Room service appropriate? Yes; Fluid consistency: Thin  Diet effective now                Brief Narrative: Patient is a 54 y.o. female with history of ESRD on PD, HTN, DM-2, chronic diarrhea for the past 2 years-sent [facility] for low hemoglobin/hematocrit and evaluation of worsening abdominal pain.  Found to have a hemoglobin of 6.5 PD catheter associated peritonitis, and anasarca related to poor ultrafiltration with PD.  She was transfused PRBC-started on empiric IV antibiotic-and subsequently adjustments were made with PD-unfortunately she continued to have anasarca-she finally consented to transition to hemodialysis on 4/20.  See below for further details  Significant events: 4/12>> admit to [facility] for severe anemia and PD catheter associated peritonitis 4/21>> HD catheter placed-started HD.  Antimicrobial therapy: Vancomycin: 4/13>> Rocephin: 4/13>>4/15  Microbiology data: 4/13>> peritoneal fluid culture: Staph epidermidis  Procedures : 4/21>> right IJ tunneled HD catheter placed by IR  Consults: Nephrology IR VVS  Brief Hospital Course: Symptomatic anemia: No indication of blood loss-suspect related to  underlying CKD.    Required unit of PRBC-CBC stable-we will continue Aranesp and IV iron in the outpatient setting per nephrology.  Continue to follow CBC.  Note there was no indication/clinical history consistent with blood loss.      PD catheter associated peritonitis: Abdominal pain has improved-exam is benign-peritoneal fluid culture positive for staph epidermidis-managed with IV vancomycin since 4/13-discussed with the nephrologist-Dr. Marcy Siren recommended 1 additional dose of vancomycin prior to discharge (spoke with pharmacy)  Diarrhea: Chronic issue-do not think she has C. difficile colitis.  She was in fact "constipated" for 2 weeks prior to admission-following which she had around 4-5 bowel movements (baseline) on the day of admission.  There has been no diarrhea since since then.  Suspect C. Difficile studies are are more indicative of colonization rather than infection.  No longer on oral vancomycin.  Per patient-she has seen GI MD in [place] for chronic diarrhea-and at one point was placed on cholestyramine that she stopped taking.  ESRD on PD-transitioned to hemodialysis on 4/21: Failed peritoneal dialysis despite aggressive adjustments to peritoneal dialysis.  Started on HD on 4/21.  Volume status much improved-spoke with Dr. Kathlyn Sacramento to discharge today-she will continue hemodialysis in the outpatient setting.  Spoke with Dr. Virgia Land surgery-they will ensure outpatient follow-up for permanent access.  Anasarca: Secondary to failed peritoneal dialysis-volume status better after starting HD-suspect will continue to improve with further outpatient hemodialysis.  Headache: Suspect migraine headaches-responded to IV Depakote-but reoccurred-headaches have resolved after starting HD.  Could have been from uremia/inadequate PD.  HTN: BP better post HD-continue labetalol and losartan for now.  Follow and adjust.  Insulin-dependent DM-2 with episodes of  hypoglycemia: CBGs stable-continue Lantus 10 units on discharge.   GERD: Continue PPI  Morbid Obesity: Estimated body mass index is 42.04 kg/m as calculated from the following:   Height as of 05/01/19: '5\' 8"'  (1.727 m).   Weight as of this encounter: 125.4 kg.   Discharge Diagnoses:  Principal Problem:   Symptomatic anemia Active Problems:   Essential hypertension   ESRD on peritoneal dialysis ([place])   Generalized abdominal pain   Uncontrolled type II diabetes mellitus with chronic kidney disease ([place])   Ascites   Discharge Instructions:  Activity:  As tolerated with Full fall precautions use walker/cane & assistance as needed  Discharge Instructions    Call MD for:  difficulty breathing, headache or visual disturbances   Complete by: As directed    Call MD for:  extreme fatigue   Complete by: As directed    Call MD for:  persistant dizziness or light-headedness   Complete by: As directed    Call MD for:  persistant nausea and vomiting   Complete by: As directed    Diet - low sodium heart healthy   Complete by: As directed    Diet Carb Modified   Complete by: As directed    Discharge instructions   Complete by: As directed    You will receive a call from vascular surgery to schedule permanent access for hemodialysis  Please follow with your dialysis center for hemodialysis.   Follow with Primary MD  Lucianne Lei, MD in 1-2 weeks  Please get a complete blood count and chemistry panel checked by your Primary MD at your next visit, and again as instructed by your Primary MD.  Get Medicines reviewed and adjusted: Please take all your medications with you for your next visit with your Primary MD  Laboratory/radiological data: Please request your Primary MD to go over all hospital tests and procedure/radiological results at the follow up, please ask your Primary MD to get all Hospital records sent to his/her office.  In some cases, they will be blood work, cultures and  biopsy results pending at the time of your discharge. Please request that your primary care M.D. follows up on these results.  Also Note the following: If you experience worsening of your admission symptoms, develop shortness of breath, life threatening emergency, suicidal or homicidal thoughts you must seek medical attention immediately by calling 911 or calling your MD immediately  if symptoms less severe.  You must read complete instructions/literature along with all the possible adverse reactions/side effects for all the Medicines you take and that have been prescribed to you. Take any new Medicines after you have completely understood and accpet all the possible adverse reactions/side effects.   Do not drive when taking Pain medications or sleeping medications (Benzodaizepines)  Do not take more than prescribed Pain, Sleep and Anxiety Medications. It is not advisable to combine anxiety,sleep and pain medications without talking with your primary care practitioner  Special Instructions: If you have smoked or chewed Tobacco  in the last 2 yrs please stop smoking, stop any regular Alcohol  and or any Recreational drug use.  Wear Seat belts while driving.  Please note: You were cared for by a hospitalist during your hospital stay. Once you are discharged, your primary care physician will handle any further medical issues. Please note that NO REFILLS for any discharge medications will be authorized  once you are discharged, as it is imperative that you return to your primary care physician (or establish a relationship with a primary care physician if you do not have one) for your post hospital discharge needs so that they can reassess your need for medications and monitor your lab values.   Increase activity slowly   Complete by: As directed      Allergies as of 07/19/2019   No Known Allergies     Medication List    STOP taking these medications   cholestyramine 4 g packet Commonly known  as: QUESTRAN   doxazosin 8 MG tablet Commonly known as: CARDURA   hyoscyamine 0.375 MG 12 hr tablet Commonly known as: LEVBID   pantoprazole 40 MG tablet Commonly known as: Protonix   potassium chloride SA 20 MEQ tablet Commonly known as: KLOR-CON     TAKE these medications   Accu-Chek Guide test strip Generic drug: glucose blood Use as instructed   Accu-Chek Guide w/Device Kit 1 Piece by Does not apply route as directed.   aspirin EC 81 MG tablet Take 81 mg by mouth daily.   calcitRIOL 0.5 MCG capsule Commonly known as: ROCALTROL Take 0.5-1 mcg by mouth See admin instructions. 0.35mg on Mondays through Fridays and take 151m on Saturdays and 'Sundays   calcium acetate 667 MG capsule Commonly known as: PHOSLO Take 2 capsules (1,334 mg total) by mouth 3 (three) times daily with meals.   Ipratropium-Albuterol 20-100 MCG/ACT Aers respimat Commonly known as: COMBIVENT Inhale 2 puffs into the lungs every 6 (six) hours as needed for wheezing or shortness of breath.   labetalol 300 MG tablet Commonly known as: NORMODYNE Take 300 mg by mouth 2 (two) times daily.   losartan 100 MG tablet Commonly known as: COZAAR Take 100 mg by mouth daily.   multivitamin Tabs tablet Take 1 tablet by mouth at bedtime.   Toujeo Max SoloStar 300 UNIT/ML Solostar Pen Generic drug: insulin glargine (2 Unit Dial) Inject 10 Units into the skin daily. What changed:   how much to take  Another medication with the same name was removed. Continue taking this medication, and follow the directions you see here.   Tylenol 325 MG tablet Generic drug: acetaminophen Take 650 mg by mouth every 6 (six) hours as needed (pain).            Durable Medical Equipment  (From admission, onward)         Start     Ordered   07/19/19 1205  For home use only DME Walker rolling  Once    Question Answer Comment  Walker: With 5 Inch Wheels   Patient needs a walker to treat with the following  condition Physical deconditioning      04' /23/21 1204         Follow-up Information    BlLucianne LeiMD. Schedule an appointment as soon as possible for a visit in 1 week(s).   Specialty: Family Medicine Contact information: [phone]      Hemodialysis center Follow up.   Why: As instructed by  renal navigator       Early, ToArvilla MeresMD Follow up.   Specialties: Vascular Surgery, Cardiology Why: Office will call to schedule a appointment for placement of permanent vascular access. Contact information: [phone]        No Known Allergies    Other Procedures/Studies: CT  ABDOMEN PELVIS WO CONTRAST  Result Date: 07/09/2019 CLINICAL DATA:  Abdomen pain EXAM: CT ABDOMEN AND PELVIS WITHOUT CONTRAST TECHNIQUE: Multidetector CT imaging of the abdomen and pelvis was performed following the standard protocol without IV contrast. COMPARISON:  05/27/2019, 04/08/2019 FINDINGS: Lower chest: Lung bases demonstrate heterogeneous mostly peripheral opacities likely scarring from prior COVID pneumonia. No consolidation or pleural effusion. Coronary vascular calcification. Mild cardiomegaly. Hepatobiliary: No focal liver abnormality is seen. Status post cholecystectomy. No biliary dilatation. Pancreas: Unremarkable. No pancreatic ductal dilatation or surrounding inflammatory changes. Spleen: Normal in size without focal abnormality. Adrenals/Urinary Tract: Adrenal glands are slightly thickened but without dominant mass. Atrophic kidneys with extensive vascular calcification. No hydronephrosis. Unremarkable urinary bladder Stomach/Bowel: Stomach is within normal limits. Appendix appears normal. No evidence of bowel wall thickening, distention, or inflammatory changes. Vascular/Lymphatic: Extensive aortic atherosclerosis. No aneurysm. No suspicious adenopathy Reproductive: IUD in the uterus Other: No free air.  Moderate to large loculated ascites within the anterior abdominal cavity. Peritoneal dialysis catheter coiled within the right anterior abdomen. Musculoskeletal: No acute or suspicious osseous abnormality. IMPRESSION: 1. Moderate to large volume of loculated appearing ascites within the anterior abdominal cavity 2. Heterogenous peripheral densities at the bases felt secondary to prior COVID pneumonia and scarring 3. Cardiomegaly.  Anasarca.  Atrophic native kidneys Electronically Signed   By: Donavan Foil M.D.   On: 07/09/2019 03:22   IR Fluoro Guide CV Line Right  Result Date: 07/17/2019 CLINICAL DATA:  End-stage renal disease. Recent removal of peritoneal dialysis catheter. Hemodialysis access required EXAM: TUNNELED HEMODIALYSIS CATHETER PLACEMENT WITH ULTRASOUND AND FLUOROSCOPIC GUIDANCE TECHNIQUE: The procedure, risks, benefits, and alternatives were explained to the patient. Questions regarding the procedure were encouraged and answered. The patient understands and consents to the procedure. As antibiotic prophylaxis, cefazolin 3 g was ordered pre-procedure and administered intravenously within one hour of incision.Patency of the right IJ vein was confirmed with ultrasound with image documentation. An appropriate skin site was determined. Region was prepped using maximum barrier technique including cap and mask, sterile gown, sterile gloves, large sterile sheet, and Chlorhexidine as cutaneous antisepsis. The region was infiltrated locally with 1% lidocaine. Intravenous Fentanyl 45mg and Versed 160mwere administered as conscious sedation during continuous monitoring of the patient's level of consciousness and physiological / cardiorespiratory status by the radiology RN, with a total moderate sedation time of 15 minutes. Under real-time ultrasound guidance, the right IJ vein was accessed with a 21 gauge micropuncture needle; the needle tip within the vein was confirmed with ultrasound image documentation.  Needle exchanged over the 018 guidewire for transitional dilator, which allowed advancement of a Benson wire into the IVC. Over this, an MPA catheter was advanced. A Palindrome 19 hemodialysis catheter was tunneled from the right anterior chest wall approach to the right IJ dermatotomy site. The MPA catheter was exchanged over an Amplatz wire for serial vascular dilators which allow placement of a peel-away sheath, through which the catheter was advanced under intermittent fluoroscopy, positioned with its tips in the proximal and midright atrium. Spot chest radiograph confirms good catheter position. No pneumothorax. Catheter was flushed and primed per protocol. Catheter secured externally with O Prolene sutures. The right IJ dermatotomy site was closed with Dermabond. COMPLICATIONS: COMPLICATIONS None immediate FLUOROSCOPY TIME:  0.5 minutes; 70 uGym2 DAP COMPARISON:  None IMPRESSION: 1. Technically successful placement of tunneled right IJ hemodialysis catheter with ultrasound and fluoroscopic guidance. Ready for routine use. ACCESS: Remains approachable for percutaneous intervention as needed. Electronically Signed   By: DKeturah Barre  Vernard Gambles M.D.   On: 07/17/2019 15:43   IR US Guide Vasc Access Right  Result Date: 07/17/2019 CLINICAL DATA:  End-stage renal disease. Recent removal of peritoneal dialysis catheter. Hemodialysis access required EXAM: TUNNELED HEMODIALYSIS CATHETER PLACEMENT WITH ULTRASOUND AND FLUOROSCOPIC GUIDANCE TECHNIQUE: The procedure, risks, benefits, and alternatives were explained to the patient. Questions regarding the procedure were encouraged and answered. The patient understands and consents to the procedure. As antibiotic prophylaxis, cefazolin 3 g was ordered pre-procedure and administered intravenously within one hour of incision.Patency of the right IJ vein was confirmed with ultrasound with image documentation. An appropriate skin site was determined. Region was prepped using maximum  barrier technique including cap and mask, sterile gown, sterile gloves, large sterile sheet, and Chlorhexidine as cutaneous antisepsis. The region was infiltrated locally with 1% lidocaine. Intravenous Fentanyl 59mg and Versed 154mwere administered as conscious sedation during continuous monitoring of the patient's level of consciousness and physiological / cardiorespiratory status by the radiology RN, with a total moderate sedation time of 15 minutes. Under real-time ultrasound guidance, the right IJ vein was accessed with a 21 gauge micropuncture needle; the needle tip within the vein was confirmed with ultrasound image documentation. Needle exchanged over the 018 guidewire for transitional dilator, which allowed advancement of a Benson wire into the IVC. Over this, an MPA catheter was advanced. A Palindrome 19 hemodialysis catheter was tunneled from the right anterior chest wall approach to the right IJ dermatotomy site. The MPA catheter was exchanged over an Amplatz wire for serial vascular dilators which allow placement of a peel-away sheath, through which the catheter was advanced under intermittent fluoroscopy, positioned with its tips in the proximal and midright atrium. Spot chest radiograph confirms good catheter position. No pneumothorax. Catheter was flushed and primed per protocol. Catheter secured externally with O Prolene sutures. The right IJ dermatotomy site was closed with Dermabond. COMPLICATIONS: COMPLICATIONS None immediate FLUOROSCOPY TIME:  0.5 minutes; 70 uGym2 DAP COMPARISON:  None IMPRESSION: 1. Technically successful placement of tunneled right IJ hemodialysis catheter with ultrasound and fluoroscopic guidance. Ready for routine use. ACCESS: Remains approachable for percutaneous intervention as needed. Electronically Signed   By: D Lucrezia Europe.D.   On: 07/17/2019 15:43   DG Chest Port 1 View  Result Date: 07/09/2019 CLINICAL DATA:  Short of breath EXAM: PORTABLE CHEST 1 VIEW  COMPARISON:  04/02/2019 FINDINGS: Low lung volumes. Cardiomegaly. Diffuse bilateral reticular and ground-glass opacity. No pleural effusion or pneumothorax. IMPRESSION: Cardiomegaly with mild diffuse fine reticular and ground-glass opacity similar compared to prior, question underlying chronic interstitial lung disease. Electronically Signed   By: KiDonavan Foil.D.   On: 07/09/2019 00:44   ECHOCARDIOGRAM COMPLETE  Result Date: 07/09/2019    ECHOCARDIOGRAM REPORT   [facility] Medical Rec #:  [id number]        Height:       68.0 in Accession #:    [id number]       Weight:       250.7 lb Date of Birth:  9/09-10-1965         BSA:          2.250 m Patient Age:    89ears           BP:           161/73 mmHg Patient Gender: F  HR:           76 bpm. Exam Location:  Inpatient Procedure: 2D Echo, Cardiac Doppler and Color Doppler Indications:    I50.23 Acute on chronic systolic (congestive) heart failure  History:        Patient has no prior history of Echocardiogram examinations.                 Risk Factors:Hypertension and Diabetes. ESRD. COVID-19.  Sonographer:    Jonelle Sidle Dance Referring Phys: [id number] [place]  1. Left ventricular ejection fraction, by estimation, is 50 to 55%. The left ventricle has normal function. The left ventricle demonstrates regional wall motion abnormalities (see scoring diagram/findings for description). There is moderate concentric left ventricular hypertrophy. Left ventricular diastolic parameters are consistent with Grade II diastolic dysfunction (pseudonormalization). Elevated left atrial pressure. There is mild hypokinesis of the left ventricular, entire inferolateral wall.  2. Right ventricular systolic function is mildly reduced. The right ventricular size is normal. There is moderately elevated pulmonary artery systolic pressure.  3. Left atrial size was moderately dilated.  4. Right atrial size  was mildly dilated.  5. The mitral valve is normal in structure. Trivial mitral valve regurgitation.  6. Tricuspid valve regurgitation is mild to moderate.  7. The aortic valve is normal in structure. Aortic valve regurgitation is trivial.  8. The inferior vena cava is dilated in size with >50% respiratory variability, suggesting right atrial pressure of 8 mmHg. FINDINGS  Left Ventricle: Left ventricular ejection fraction, by estimation, is 50 to 55%. The left ventricle has normal function. The left ventricle demonstrates regional wall motion abnormalities. Mild hypokinesis of the left ventricular, entire inferolateral wall. The left ventricular internal cavity size was normal in size. There is moderate concentric left ventricular hypertrophy. Left ventricular diastolic parameters are consistent with Grade II diastolic dysfunction (pseudonormalization). Elevated left atrial pressure. Right Ventricle: The right ventricular size is normal. No increase in right ventricular wall thickness. Right ventricular systolic function is mildly reduced. There is moderately elevated pulmonary artery systolic pressure. The tricuspid regurgitant velocity is 2.91 m/s, and with an assumed right atrial pressure of 8 mmHg, the estimated right ventricular systolic pressure is 03.5 mmHg. Left Atrium: Left atrial size was moderately dilated. Right Atrium: Right atrial size was mildly dilated. Pericardium: There is no evidence of pericardial effusion. Mitral Valve: The mitral valve is normal in structure. Mild mitral annular calcification. Trivial mitral valve regurgitation. Tricuspid Valve: The tricuspid valve is normal in structure. Tricuspid valve regurgitation is mild to moderate. Aortic Valve: The aortic valve is normal in structure. Aortic valve regurgitation is trivial. Pulmonic Valve: The pulmonic valve was normal in structure. Pulmonic valve regurgitation is not visualized. Aorta: The aortic root and ascending aorta are  structurally normal, with no evidence of dilitation. Venous: The inferior vena cava is dilated in size with greater than 50% respiratory variability, suggesting right atrial pressure of 8 mmHg. IAS/Shunts: No atrial level shunt detected by color flow Doppler.  LEFT VENTRICLE PLAX 2D LVIDd:         5.30 cm  Diastology LVIDs:         3.70 cm  LV e' lateral:   8.05 cm/s LV PW:         1.50 cm  LV E/e' lateral: 16.8 LV IVS:        1.30 cm  LV e' medial:    5.22 cm/s LVOT diam:     1.90 cm  LV E/e' medial:  25.9  LV SV:         57 LV SV Index:   25 LVOT Area:     2.84 cm  RIGHT VENTRICLE            IVC RV Basal diam:  3.50 cm    IVC diam: 2.20 cm RV Mid diam:    2.10 cm RV S prime:     9.03 cm/s TAPSE (M-mode): 2.0 cm LEFT ATRIUM              Index       RIGHT ATRIUM           Index LA diam:        4.80 cm  2.13 cm/m  RA Area:     19.00 cm LA Vol (A2C):   107.0 ml 47.56 ml/m RA Volume:   54.50 ml  24.23 ml/m LA Vol (A4C):   77.4 ml  34.40 ml/m LA Biplane Vol: 94.9 ml  42.18 ml/m  AORTIC VALVE LVOT Vmax:   83.00 cm/s LVOT Vmean:  61.400 cm/s LVOT VTI:    0.200 m  AORTA Ao Root diam: 3.10 cm Ao Asc diam:  3.15 cm MITRAL VALVE                TRICUSPID VALVE MV Area (PHT): 4.21 cm     TR Peak grad:   33.9 mmHg MV Decel Time: 180 msec     TR Vmax:        291.00 cm/s MV E velocity: 135.00 cm/s MV A velocity: 79.60 cm/s   SHUNTS MV E/A ratio:  1.70         Systemic VTI:  0.20 m                             Systemic Diam: 1.90 cm Dani Gobble Croitoru MD Electronically signed by Sanda Klein MD Signature Date/Time: 07/09/2019/1:54:09 PM    Final    VAS [place] UPPER EXT VEIN MAPPING (PRE-OP AVF)  Result Date: 07/18/2019 UPPER EXTREMITY VEIN MAPPING  Indications: Pre-access. Comparison Study: No prior studies. Performing Technologist: Oliver Hum RVT  Examination Guidelines: A complete evaluation includes B-mode imaging, spectral Doppler, color Doppler, and power Doppler as needed of all accessible portions of each vessel.  Bilateral testing is considered an integral part of a complete examination. Limited examinations for reoccurring indications may be performed as noted. +-----------------+-------------+----------+--------------+ Right Cephalic   Diameter (cm)Depth (cm)   Findings    +-----------------+-------------+----------+--------------+ Shoulder             0.32        1.30                  +-----------------+-------------+----------+--------------+ Prox upper arm       0.52        0.75     branching    +-----------------+-------------+----------+--------------+ Mid upper arm        0.48        0.93                  +-----------------+-------------+----------+--------------+ Dist upper arm       0.36        0.49     branching    +-----------------+-------------+----------+--------------+ Antecubital fossa    0.52        0.23      Thrombus    +-----------------+-------------+----------+--------------+ Prox forearm         0.29  0.75     branching    +-----------------+-------------+----------+--------------+ Mid forearm          0.31        0.49                  +-----------------+-------------+----------+--------------+ Dist forearm                            not visualized +-----------------+-------------+----------+--------------+ +-----------------+-------------+----------+---------+ Right Basilic    Diameter (cm)Depth (cm)Findings  +-----------------+-------------+----------+---------+ Shoulder             0.58        1.30             +-----------------+-------------+----------+---------+ Mid upper arm        0.40        1.50             +-----------------+-------------+----------+---------+ Dist upper arm       0.52        1.10             +-----------------+-------------+----------+---------+ Antecubital fossa    0.27        0.74   branching +-----------------+-------------+----------+---------+ Prox forearm         0.23        0.43              +-----------------+-------------+----------+---------+ Mid forearm          0.26        0.17             +-----------------+-------------+----------+---------+ Distal forearm       0.29        0.14             +-----------------+-------------+----------+---------+ +-----------------+-------------+----------+----------------------+ Left Cephalic    Diameter (cm)Depth (cm)       Findings        +-----------------+-------------+----------+----------------------+ Shoulder             0.33        0.87                          +-----------------+-------------+----------+----------------------+ Prox upper arm       0.26        1.30         branching        +-----------------+-------------+----------+----------------------+ Mid upper arm        0.24        0.98                          +-----------------+-------------+----------+----------------------+ Dist upper arm       0.21        0.60         branching        +-----------------+-------------+----------+----------------------+ Antecubital fossa    0.32        0.28                          +-----------------+-------------+----------+----------------------+ Prox forearm         0.49        0.84   branching and Thrombus +-----------------+-------------+----------+----------------------+ Mid forearm                                 not visualized     +-----------------+-------------+----------+----------------------+ Dist forearm  0.21        0.31                          +-----------------+-------------+----------+----------------------+ +-----------------+-------------+----------+---------+ Left Basilic     Diameter (cm)Depth (cm)Findings  +-----------------+-------------+----------+---------+ Shoulder             0.68        1.50             +-----------------+-------------+----------+---------+ Mid upper arm        0.46        1.40              +-----------------+-------------+----------+---------+ Dist upper arm       0.57        1.20             +-----------------+-------------+----------+---------+ Antecubital fossa    0.43        0.68   branching +-----------------+-------------+----------+---------+ Prox forearm         0.41        0.55   branching +-----------------+-------------+----------+---------+ Mid forearm          0.33        0.60   branching +-----------------+-------------+----------+---------+ Distal forearm       0.24        0.44             +-----------------+-------------+----------+---------+ *See table(s) above for measurements and observations.  Diagnosing physician: Monica Martinez MD Electronically signed by Monica Martinez MD on 07/18/2019 at 8:13:03 PM.    Final    IR Paracentesis  Result Date: 07/09/2019 INDICATION: Patient with history of end stage renal disease admitted with abdominal pain and ascites presents for diagnostic paracentesis maximum 100 ml per Team's request. EXAM: ULTRASOUND GUIDED DIAGNOSTIC PARACENTESIS MEDICATIONS: Lidocaine 1% 10 mL COMPLICATIONS: None immediate. PROCEDURE: Informed written consent was obtained from the patient after a discussion of the risks, benefits and alternatives to treatment. A timeout was performed prior to the initiation of the procedure. Initial ultrasound scanning demonstrates a large amount of ascites within the right lower abdominal quadrant. The right lower abdomen was prepped and draped in the usual sterile fashion. 1% lidocaine was used for local anesthesia. Following this, a 19 gauge, 10-cm, Yueh catheter was introduced. An ultrasound image was saved for documentation purposes. The paracentesis was performed. The catheter was removed and a dressing was applied. The patient tolerated the procedure well without immediate post procedural complication. FINDINGS: A total of approximately 100 m of clear fluid was removed. Samples were sent to the  laboratory as requested by the clinical team. IMPRESSION: Successful ultrasound-guided therapeutic paracentesis yielding 100 mililiters of peritoneal fluid. Read by Rushie Nyhan NP Electronically Signed   By: Markus Daft M.D.   On: 07/09/2019 09:12     TODAY-DAY OF DISCHARGE:  Subjective:   [name] today has no headache,no chest abdominal pain,no new weakness tingling or numbness, feels much better wants to go home today.   Objective:   Blood pressure (!) 115/56, pulse 74, temperature 99.1 F (37.3 C), temperature source Oral, resp. rate 17, weight 125.4 kg, SpO2 96 %.  Intake/Output Summary (Last 24 hours) at 07/19/2019 1215 Last data filed at 07/19/2019 1045 Gross per 24 hour  Intake 360 ml  Output --  Net 360 ml   Filed Weights   07/18/19 1011 07/19/19 0440 07/19/19 0504  Weight: 124.5 kg 125.4 kg 125.4 kg    Exam: Awake Alert, Oriented *3, No  new F.N deficits, Normal affect [place].AT,PERRAL Supple Neck,No JVD, No cervical lymphadenopathy appriciated.  Symmetrical Chest wall movement, Good air movement bilaterally, CTAB RRR,No Gallops,Rubs or new Murmurs, No Parasternal Heave +ve B.Sounds, Abd Soft, Non tender, No organomegaly appriciated, No rebound -guarding or rigidity. No Cyanosis, Clubbing or edema, No new Rash or bruise   PERTINENT RADIOLOGIC STUDIES: IR Fluoro Guide CV Line Right  Result Date: 07/17/2019 CLINICAL DATA:  End-stage renal disease. Recent removal of peritoneal dialysis catheter. Hemodialysis access required EXAM: TUNNELED HEMODIALYSIS CATHETER PLACEMENT WITH ULTRASOUND AND FLUOROSCOPIC GUIDANCE TECHNIQUE: The procedure, risks, benefits, and alternatives were explained to the patient. Questions regarding the procedure were encouraged and answered. The patient understands and consents to the procedure. As antibiotic prophylaxis, cefazolin 3 g was ordered pre-procedure and administered intravenously within one hour of incision.Patency of the right  IJ vein was confirmed with ultrasound with image documentation. An appropriate skin site was determined. Region was prepped using maximum barrier technique including cap and mask, sterile gown, sterile gloves, large sterile sheet, and Chlorhexidine as cutaneous antisepsis. The region was infiltrated locally with 1% lidocaine. Intravenous Fentanyl 23mg and Versed 179mwere administered as conscious sedation during continuous monitoring of the patient's level of consciousness and physiological / cardiorespiratory status by the radiology RN, with a total moderate sedation time of 15 minutes. Under real-time ultrasound guidance, the right IJ vein was accessed with a 21 gauge micropuncture needle; the needle tip within the vein was confirmed with ultrasound image documentation. Needle exchanged over the 018 guidewire for transitional dilator, which allowed advancement of a Benson wire into the IVC. Over this, an MPA catheter was advanced. A Palindrome 19 hemodialysis catheter was tunneled from the right anterior chest wall approach to the right IJ dermatotomy site. The MPA catheter was exchanged over an Amplatz wire for serial vascular dilators which allow placement of a peel-away sheath, through which the catheter was advanced under intermittent fluoroscopy, positioned with its tips in the proximal and midright atrium. Spot chest radiograph confirms good catheter position. No pneumothorax. Catheter was flushed and primed per protocol. Catheter secured externally with O Prolene sutures. The right IJ dermatotomy site was closed with Dermabond. COMPLICATIONS: COMPLICATIONS None immediate FLUOROSCOPY TIME:  0.5 minutes; 70 uGym2 DAP COMPARISON:  None IMPRESSION: 1. Technically successful placement of tunneled right IJ hemodialysis catheter with ultrasound and fluoroscopic guidance. Ready for routine use. ACCESS: Remains approachable for percutaneous intervention as needed. Electronically Signed   By: D Lucrezia Europe.D.   On:  07/17/2019 15:43   IR US[place]auide Vasc Access Right  Result Date: 07/17/2019 CLINICAL DATA:  End-stage renal disease. Recent removal of peritoneal dialysis catheter. Hemodialysis access required EXAM: TUNNELED HEMODIALYSIS CATHETER PLACEMENT WITH ULTRASOUND AND FLUOROSCOPIC GUIDANCE TECHNIQUE: The procedure, risks, benefits, and alternatives were explained to the patient. Questions regarding the procedure were encouraged and answered. The patient understands and consents to the procedure. As antibiotic prophylaxis, cefazolin 3 g was ordered pre-procedure and administered intravenously within one hour of incision.Patency of the right IJ vein was confirmed with ultrasound with image documentation. An appropriate skin site was determined. Region was prepped using maximum barrier technique including cap and mask, sterile gown, sterile gloves, large sterile sheet, and Chlorhexidine as cutaneous antisepsis. The region was infiltrated locally with 1% lidocaine. Intravenous Fentanyl 5069mand Versed 1mg66mre administered as conscious sedation during continuous monitoring of the patient's level of consciousness and physiological / cardiorespiratory status by the radiology RN, with a total moderate sedation time of 15 minutes.  Under real-time ultrasound guidance, the right IJ vein was accessed with a 21 gauge micropuncture needle; the needle tip within the vein was confirmed with ultrasound image documentation. Needle exchanged over the 018 guidewire for transitional dilator, which allowed advancement of a Benson wire into the IVC. Over this, an MPA catheter was advanced. A Palindrome 19 hemodialysis catheter was tunneled from the right anterior chest wall approach to the right IJ dermatotomy site. The MPA catheter was exchanged over an Amplatz wire for serial vascular dilators which allow placement of a peel-away sheath, through which the catheter was advanced under intermittent fluoroscopy, positioned with its tips in the  proximal and midright atrium. Spot chest radiograph confirms good catheter position. No pneumothorax. Catheter was flushed and primed per protocol. Catheter secured externally with O Prolene sutures. The right IJ dermatotomy site was closed with Dermabond. COMPLICATIONS: COMPLICATIONS None immediate FLUOROSCOPY TIME:  0.5 minutes; 70 uGym2 DAP COMPARISON:  None IMPRESSION: 1. Technically successful placement of tunneled right IJ hemodialysis catheter with ultrasound and fluoroscopic guidance. Ready for routine use. ACCESS: Remains approachable for percutaneous intervention as needed. Electronically Signed   By: Lucrezia Europe M.D.   On: 07/17/2019 15:43   VAS [place] UPPER EXT VEIN MAPPING (PRE-OP AVF)  Result Date: 07/18/2019 UPPER EXTREMITY VEIN MAPPING  Indications: Pre-access. Comparison Study: No prior studies. Performing Technologist: Oliver Hum RVT  Examination Guidelines: A complete evaluation includes B-mode imaging, spectral Doppler, color Doppler, and power Doppler as needed of all accessible portions of each vessel. Bilateral testing is considered an integral part of a complete examination. Limited examinations for reoccurring indications may be performed as noted. +-----------------+-------------+----------+--------------+ Right Cephalic   Diameter (cm)Depth (cm)   Findings    +-----------------+-------------+----------+--------------+ Shoulder             0.32        1.30                  +-----------------+-------------+----------+--------------+ Prox upper arm       0.52        0.75     branching    +-----------------+-------------+----------+--------------+ Mid upper arm        0.48        0.93                  +-----------------+-------------+----------+--------------+ Dist upper arm       0.36        0.49     branching    +-----------------+-------------+----------+--------------+ Antecubital fossa    0.52        0.23      Thrombus     +-----------------+-------------+----------+--------------+ Prox forearm         0.29        0.75     branching    +-----------------+-------------+----------+--------------+ Mid forearm          0.31        0.49                  +-----------------+-------------+----------+--------------+ Dist forearm                            not visualized +-----------------+-------------+----------+--------------+ +-----------------+-------------+----------+---------+ Right Basilic    Diameter (cm)Depth (cm)Findings  +-----------------+-------------+----------+---------+ Shoulder             0.58        1.30             +-----------------+-------------+----------+---------+ Mid upper arm  0.40        1.50             +-----------------+-------------+----------+---------+ Dist upper arm       0.52        1.10             +-----------------+-------------+----------+---------+ Antecubital fossa    0.27        0.74   branching +-----------------+-------------+----------+---------+ Prox forearm         0.23        0.43             +-----------------+-------------+----------+---------+ Mid forearm          0.26        0.17             +-----------------+-------------+----------+---------+ Distal forearm       0.29        0.14             +-----------------+-------------+----------+---------+ +-----------------+-------------+----------+----------------------+ Left Cephalic    Diameter (cm)Depth (cm)       Findings        +-----------------+-------------+----------+----------------------+ Shoulder             0.33        0.87                          +-----------------+-------------+----------+----------------------+ Prox upper arm       0.26        1.30         branching        +-----------------+-------------+----------+----------------------+ Mid upper arm        0.24        0.98                           +-----------------+-------------+----------+----------------------+ Dist upper arm       0.21        0.60         branching        +-----------------+-------------+----------+----------------------+ Antecubital fossa    0.32        0.28                          +-----------------+-------------+----------+----------------------+ Prox forearm         0.49        0.84   branching and Thrombus +-----------------+-------------+----------+----------------------+ Mid forearm                                 not visualized     +-----------------+-------------+----------+----------------------+ Dist forearm         0.21        0.31                          +-----------------+-------------+----------+----------------------+ +-----------------+-------------+----------+---------+ Left Basilic     Diameter (cm)Depth (cm)Findings  +-----------------+-------------+----------+---------+ Shoulder             0.68        1.50             +-----------------+-------------+----------+---------+ Mid upper arm        0.46        1.40             +-----------------+-------------+----------+---------+ Dist upper arm       0.57  1.20             +-----------------+-------------+----------+---------+ Antecubital fossa    0.43        0.68   branching +-----------------+-------------+----------+---------+ Prox forearm         0.41        0.55   branching +-----------------+-------------+----------+---------+ Mid forearm          0.33        0.60   branching +-----------------+-------------+----------+---------+ Distal forearm       0.24        0.44             +-----------------+-------------+----------+---------+ *See table(s) above for measurements and observations.  Diagnosing physician: Monica Martinez MD Electronically signed by Monica Martinez MD on 07/18/2019 at 8:13:03 PM.    Final      PERTINENT LAB RESULTS: CBC: Recent Labs    07/18/19 0500 07/19/19 0638   WBC 8.9 9.0  HGB 6.8* 8.5*  HCT 22.8* 28.6*  PLT 164 170   CMET CMP     Component Value Date/Time   NA 136 07/19/2019 0638   K 4.2 07/19/2019 0638   CL 98 07/19/2019 0638   CO2 27 07/19/2019 0638   GLUCOSE 140 (H) 07/19/2019 0638   BUN 28 (H) 07/19/2019 0638   CREATININE 7.05 (H) 07/19/2019 0638   CALCIUM 9.5 07/19/2019 0638   PROT 7.0 07/10/2019 0419   ALBUMIN 2.3 (L) 07/19/2019 0638   AST 11 (L) 07/10/2019 0419   ALT 10 07/10/2019 0419   ALKPHOS 47 07/10/2019 0419   BILITOT 0.8 07/10/2019 0419   GFRNONAA 6 (L) 07/19/2019 0638   GFRAA 7 (L) 07/19/2019 0638    GFR Estimated Creatinine Clearance: 13.2 mL/min (A) (by C-G formula based on SCr of 7.05 mg/dL (H)). No results for input(s): LIPASE, AMYLASE in the last 72 hours. No results for input(s): CKTOTAL, CKMB, CKMBINDEX, TROPONINI in the last 72 hours. Invalid input(s): POCBNP No results for input(s): DDIMER in the last 72 hours. No results for input(s): HGBA1C in the last 72 hours. No results for input(s): CHOL, HDL, LDLCALC, TRIG, CHOLHDL, LDLDIRECT in the last 72 hours. No results for input(s): TSH, T4TOTAL, T3FREE, THYROIDAB in the last 72 hours.  Invalid input(s): FREET3 No results for input(s): VITAMINB12, FOLATE, FERRITIN, TIBC, IRON, RETICCTPCT in the last 72 hours. Coags: No results for input(s): INR in the last 72 hours.  Invalid input(s): PT Microbiology: Recent Results (from the past 240 hour(s))  C Difficile Quick Screen w PCR reflex     Status: Abnormal   Collection Time: 07/09/19  2:27 PM   Specimen: STOOL  Result Value Ref Range Status   C Diff antigen POSITIVE (A) NEGATIVE Final   C Diff toxin NEGATIVE NEGATIVE Final   C Diff interpretation Results are indeterminate. See PCR results.  Final    Comment: Performed at [facility], [place] [address], [place], [place] [address]  C. Diff by PCR, Reflexed     Status: Abnormal   Collection Time: 07/09/19  2:27 PM  Result Value Ref Range Status    Toxigenic C. Difficile by PCR POSITIVE (A) NEGATIVE Final    Comment: Positive for toxigenic C. difficile with little to no toxin production. Only treat if clinical presentation suggests symptomatic illness. Performed at [facility], [place] [address] [place], [place] [address]   Body fluid culture     Status: None   Collection Time: 07/14/19  1:12 PM   Specimen: Peritoneal Dialysate;  Body Fluid  Result Value Ref Range Status   Specimen Description PERITONEAL DIALYSATE  Final   Special Requests Immunocompromised  Final   Gram Stain   Final    NO WBC SEEN NO ORGANISMS SEEN Performed at [facility], [address] [place], [place] [address]    Culture FEW STAPHYLOCOCCUS EPIDERMIDIS  Final   Report Status 07/17/2019 FINAL  Final   Organism ID, Bacteria STAPHYLOCOCCUS EPIDERMIDIS  Final      Susceptibility   Staphylococcus epidermidis - MIC*    CIPROFLOXACIN 4 RESISTANT Resistant     ERYTHROMYCIN >=8 RESISTANT Resistant     GENTAMICIN <=0.5 SENSITIVE Sensitive     OXACILLIN >=4 RESISTANT Resistant     TETRACYCLINE 2 SENSITIVE Sensitive     VANCOMYCIN 1 SENSITIVE Sensitive     TRIMETH/SULFA 20 SENSITIVE Sensitive     CLINDAMYCIN >=8 RESISTANT Resistant     RIFAMPIN <=0.5 SENSITIVE Sensitive     Inducible Clindamycin NEGATIVE Sensitive     * FEW STAPHYLOCOCCUS EPIDERMIDIS  Surgical PCR screen     Status: None   Collection Time: 07/17/19 10:03 AM   Specimen: Nasal Mucosa; Nasal Swab  Result Value Ref Range Status   MRSA, PCR NEGATIVE NEGATIVE Final   Staphylococcus aureus NEGATIVE NEGATIVE Final    Comment: (NOTE) The Xpert SA Assay (FDA approved for NASAL specimens in patients 60 years of age and older), is one component of a comprehensive surveillance program. It is not intended to diagnose infection nor to guide or monitor treatment. Performed at [facility], [place] [address], [place], [place] [address]     FURTHER DISCHARGE INSTRUCTIONS:  Get Medicines  reviewed and adjusted: Please take all your medications with you for your next visit with your Primary MD  Laboratory/radiological data: Please request your Primary MD to go over all hospital tests and procedure/radiological results at the follow up, please ask your Primary MD to get all Hospital records sent to his/her office.  In some cases, they will be blood work, cultures and biopsy results pending at the time of your discharge. Please request that your primary care M.D. goes through all the records of your hospital data and follows up on these results.  Also Note the following: If you experience worsening of your admission symptoms, develop shortness of breath, life threatening emergency, suicidal or homicidal thoughts you must seek medical attention immediately by calling 911 or calling your MD immediately  if symptoms less severe.  You must read complete instructions/literature along with all the possible adverse reactions/side effects for all the Medicines you take and that have been prescribed to you. Take any new Medicines after you have completely understood and accpet all the possible adverse reactions/side effects.   Do not drive when taking Pain medications or sleeping medications (Benzodaizepines)  Do not take more than prescribed Pain, Sleep and Anxiety Medications. It is not advisable to combine anxiety,sleep and pain medications without talking with your primary care practitioner  Special Instructions: If you have smoked or chewed Tobacco  in the last 2 yrs please stop smoking, stop any regular Alcohol  and or any Recreational drug use.  Wear Seat belts while driving.  Please note: You were cared for by a hospitalist during your hospital stay. Once you are discharged, your primary care physician will handle any further medical issues. Please note that NO REFILLS for any discharge medications will be authorized once you are discharged, as it is imperative that you return to  your primary care physician (or establish a relationship with a primary care physician if you do not have one) for your post hospital discharge needs so that they can reassess your need for medications and monitor your lab values.  Total Time spent coordinating discharge including counseling, education and face to face time equals 35 minutes.  SignedOren Binet 07/19/2019 12:15 PM

## 2019-07-19 NOTE — Progress Notes (Signed)
Spoke with pt for pre-op call. Pt denies cardiac history, chest pain or sob. Pt is a type 2 diabetic. Last A1C was 7.2 on 07/08/19. Pt states she does not check her fasting blood sugar at home. Instructed pt to take 1/2 of her regular dose of [place] Insulin Sunday PM, will take 5 units. Instructed pt to check her blood sugar Monday AM and every 2 hours until she leaves for the hospital. If blood sugar is 70 or below, treat with 1/2 cup of clear juice (apple or cranberry) and recheck blood sugar 15 minutes after drinking juice. If blood sugar continues to be 70 or below, call the Short Stay department and ask to speak to a nurse. Pt voiced understanding.  Pt had Covid first part of January. She will get a Covid test Saturday. Instructed pt that she needs to quarantine once she gets the test done and stay in quarantine until she comes to the hospital on Monday.

## 2019-07-19 NOTE — Progress Notes (Signed)
AVS reviewed with patient and all questions answered to patients satisfaction.  Patient waiting on husband to arrive for transport home.

## 2019-07-19 NOTE — Progress Notes (Signed)
   07/19/19 1928  OBSTRUCTIVE SLEEP APNEA  Have you ever been diagnosed with sleep apnea through a sleep study? No  Do you snore loudly (loud enough to be heard through closed doors)?  1  Do you often feel tired, fatigued, or sleepy during the daytime (such as falling asleep during driving or talking to someone)? 1  Has anyone observed you stop breathing during your sleep? 1  Do you have, or are you being treated for high blood pressure? 1  BMI more than 35 kg/m2? 1  Age > 75 (1-yes) 1  Female Gender (Yes=1) 0  Obstructive Sleep Apnea Score 6  Score 5 or greater  Results sent to PCP

## 2019-07-19 NOTE — Care Management Important Message (Signed)
Important Message  Patient Details  Name: Janet Mitchell MRN: [id number] Date of Birth: 12/12/1965   Medicare Important Message Given:  Yes - Important Message mailed due to current National Emergency  Verbal consent obtained due to current National Emergency  Relationship to patient: Self Contact Name: Malory Pinnix Call Date: 07/19/19  Time: 1242 Phone: [phone] Outcome: Spoke with contact Important Message mailed to: Other (must enter comment)(patient declined additional copy of IM)    Orlie Cundari P Marilyne Haseley 07/19/2019, 12:42 PM

## 2019-07-19 NOTE — TOC Progression Note (Signed)
Transition of Care [facility]) - Progression Note    Patient Details  Name: Janet Mitchell MRN: [id number] Date of Birth: 01-16-1966  [facility]) CM/SW Contact  Maryclare Labrador, RN Phone Number: 07/19/2019, 1:51 PM   Clinical Narrative:   Pt deemed stable for discharge home today.  Pt denied barriers with NC360 parameters.  Pt declined RW as ordered as pt states she already has RW In home. CM confirmed with pt's preferred pharmacy that pt has medicare part D and [place] is covered. Discharge order written - no outstanding TOC needs - CM signing off     Expected Discharge Plan: [place] Barriers to Discharge: Continued Medical Work up  Expected Discharge Plan and Services Expected Discharge Plan: [place] Choice: [place] arrangements for the past 2 months: Single Family Home Expected Discharge Date: 07/19/19                         HH Arranged: PT, OT [place] Agency: [place] ([place]) Date [place]: 07/17/19 Time [place]: [place] Representative spoke with at [place]: [place] ([place]) Interventions    Readmission Risk Interventions Readmission Risk Prevention Plan 07/19/2019 07/12/2019 04/05/2019  Transportation Screening Complete Complete Complete  Medication Review Press photographer) Complete - Complete  PCP or Specialist appointment within 3-5 days of discharge Patient refused - -  [place] or Home Care Consult Complete - -  Palliative Care Screening Not Applicable - Not [place] Not Applicable Not Applicable Patient Refused  Some recent data might be hidden

## 2019-07-19 NOTE — TOC Benefit Eligibility Note (Signed)
Transition of Care [facility]) Benefit Eligibility Note    Patient Details  Name: Janet Mitchell MRN: [id number] Date of Birth: 1965/07/09   Medication/Dose: [place] STAR                       Additional Notes: MEDICAID  [place] ACCESS  EFF-DATE: 09-25-2017 CO-PAY$ 4.00 FOR EACH PRESCRIPTION    Memory Argue Phone Number: 07/19/2019, 1:12 PM

## 2019-07-20 ENCOUNTER — Other Ambulatory Visit (HOSPITAL_COMMUNITY)
Admit: 2019-07-20 | Discharge: 2019-07-20 | Disposition: A | Discharge status: Home / Self Care | Payer: Medicare Other | Source: Ambulatory Visit | Attending: Vascular Surgery | Admitting: Vascular Surgery

## 2019-07-20 DIAGNOSIS — Z20822 Contact with and (suspected) exposure to covid-19: Secondary | ICD-10-CM | POA: Diagnosis not present

## 2019-07-20 DIAGNOSIS — Z01812 Encounter for preprocedural laboratory examination: Secondary | ICD-10-CM | POA: Insufficient documentation

## 2019-07-20 LAB — SARS CORONAVIRUS 2 (TAT 6-24 HRS): SARS Coronavirus 2: NEGATIVE

## 2019-07-21 MED ORDER — DEXTROSE 5 % IV SOLN
3.0000 g | INTRAVENOUS | Status: AC
  Administered 2019-07-22: 3 g via INTRAVENOUS
  Filled 2019-07-21: qty 3000
  Filled 2019-07-21: qty 3

## 2019-07-21 NOTE — Anesthesia Preprocedure Evaluation (Addendum)
Anesthesia Evaluation  Patient identified by MRN, date of birth, ID band Patient awake    Reviewed: Allergy & Precautions, NPO status , Patient's Chart, lab work & pertinent test results  History of Anesthesia Complications (+) POST - OP SPINAL HEADACHE and history of anesthetic complications  Airway Mallampati: II  TM Distance: >3 FB Neck ROM: Full    Dental no notable dental hx. (+) Dental Advisory Given   Pulmonary neg pulmonary ROS,    Pulmonary exam normal        Cardiovascular hypertension, Pt. on medications and Pt. on home beta blockers +CHF  Normal cardiovascular exam     Neuro/Psych negative neurological ROS     GI/Hepatic negative GI ROS, Neg liver ROS,   Endo/Other  diabetes, Type 2Morbid obesity  Renal/GU ESRF and DialysisRenal disease     Musculoskeletal   Abdominal   Peds  Hematology  (+) anemia ,   Anesthesia Other Findings   Reproductive/Obstetrics                           Anesthesia Physical  Anesthesia Plan  ASA: III  Anesthesia Plan: MAC   Post-op Pain Management:    Induction: Intravenous  PONV Risk Score and Plan: 3 and Dexamethasone, Ondansetron and Propofol infusion  Airway Management Planned: Natural Airway  Additional Equipment: None  Intra-op Plan:   Post-operative Plan:   Informed Consent: I have reviewed the patients History and Physical, chart, labs and discussed the procedure including the risks, benefits and alternatives for the proposed anesthesia with the patient or authorized representative who has indicated his/her understanding and acceptance.     Dental advisory given  Plan Discussed with: CRNA, Anesthesiologist and Surgeon  Anesthesia Plan Comments:       Anesthesia Quick Evaluation

## 2019-07-22 ENCOUNTER — Encounter (HOSPITAL_COMMUNITY): Payer: Self-pay | Admitting: Vascular Surgery

## 2019-07-22 ENCOUNTER — Ambulatory Visit (HOSPITAL_COMMUNITY)
Admission: RE | Admit: 2019-07-22 | Discharge: 2019-07-22 | Disposition: A | Discharge status: Home / Self Care | Payer: Medicare Other | Attending: Vascular Surgery | Admitting: Vascular Surgery

## 2019-07-22 ENCOUNTER — Encounter (HOSPITAL_COMMUNITY)
Admission: RE | Disposition: A | Discharge status: Home / Self Care | Payer: Self-pay | Source: Home / Self Care | Attending: Vascular Surgery

## 2019-07-22 ENCOUNTER — Other Ambulatory Visit: Payer: Self-pay

## 2019-07-22 ENCOUNTER — Ambulatory Visit (HOSPITAL_COMMUNITY): Payer: Medicare Other | Admitting: Anesthesiology

## 2019-07-22 DIAGNOSIS — Z992 Dependence on renal dialysis: Secondary | ICD-10-CM | POA: Insufficient documentation

## 2019-07-22 DIAGNOSIS — N185 Chronic kidney disease, stage 5: Secondary | ICD-10-CM

## 2019-07-22 DIAGNOSIS — M5127 Other intervertebral disc displacement, lumbosacral region: Secondary | ICD-10-CM | POA: Diagnosis not present

## 2019-07-22 DIAGNOSIS — M4317 Spondylolisthesis, lumbosacral region: Secondary | ICD-10-CM | POA: Diagnosis not present

## 2019-07-22 DIAGNOSIS — E1122 Type 2 diabetes mellitus with diabetic chronic kidney disease: Secondary | ICD-10-CM | POA: Diagnosis not present

## 2019-07-22 DIAGNOSIS — M5137 Other intervertebral disc degeneration, lumbosacral region: Secondary | ICD-10-CM | POA: Diagnosis not present

## 2019-07-22 DIAGNOSIS — Z79899 Other long term (current) drug therapy: Secondary | ICD-10-CM | POA: Diagnosis not present

## 2019-07-22 DIAGNOSIS — U071 COVID-19: Secondary | ICD-10-CM | POA: Diagnosis not present

## 2019-07-22 DIAGNOSIS — Z7982 Long term (current) use of aspirin: Secondary | ICD-10-CM | POA: Insufficient documentation

## 2019-07-22 DIAGNOSIS — I509 Heart failure, unspecified: Secondary | ICD-10-CM | POA: Diagnosis not present

## 2019-07-22 DIAGNOSIS — N186 End stage renal disease: Secondary | ICD-10-CM | POA: Diagnosis not present

## 2019-07-22 DIAGNOSIS — I132 Hypertensive heart and chronic kidney disease with heart failure and with stage 5 chronic kidney disease, or end stage renal disease: Secondary | ICD-10-CM | POA: Diagnosis not present

## 2019-07-22 HISTORY — DX: Unspecified osteoarthritis, unspecified site: M19.90

## 2019-07-22 HISTORY — PX: AV FISTULA PLACEMENT: SHX1204

## 2019-07-22 LAB — GLUCOSE, CAPILLARY: Glucose-Capillary: 140 mg/dL — ABNORMAL HIGH (ref 70–99)

## 2019-07-22 LAB — BPAM RBC
Blood Product Expiration Date: 202105252359
Blood Product Expiration Date: 202105252359
ISSUE DATE / TIME: 202104221248
Unit Type and Rh: 6200
Unit Type and Rh: 6200

## 2019-07-22 LAB — POCT I-STAT, CHEM 8
BUN: 51 mg/dL — ABNORMAL HIGH (ref 6–20)
Calcium, Ion: 1.09 mmol/L — ABNORMAL LOW (ref 1.15–1.40)
Chloride: 101 mmol/L (ref 98–111)
Creatinine, Ser: 9.6 mg/dL — ABNORMAL HIGH (ref 0.44–1.00)
Glucose, Bld: 139 mg/dL — ABNORMAL HIGH (ref 70–99)
HCT: 27 % — ABNORMAL LOW (ref 36.0–46.0)
Hemoglobin: 9.2 g/dL — ABNORMAL LOW (ref 12.0–15.0)
Potassium: 4.6 mmol/L (ref 3.5–5.1)
Sodium: 136 mmol/L (ref 135–145)
TCO2: 25 mmol/L (ref 22–32)

## 2019-07-22 LAB — TYPE AND SCREEN
ABO/RH(D): A POS
Antibody Screen: NEGATIVE
Donor AG Type: NEGATIVE
Unit division: 0
Unit division: 0

## 2019-07-22 SURGERY — ARTERIOVENOUS (AV) FISTULA CREATION
Anesthesia: Monitor Anesthesia Care | Site: Arm Upper | Laterality: Left

## 2019-07-22 MED ORDER — PROMETHAZINE HCL 25 MG/ML IJ SOLN: 6.2500 mg | INTRAMUSCULAR | Status: DC | PRN

## 2019-07-22 MED ORDER — LIDOCAINE 2% (20 MG/ML) 5 ML SYRINGE
INTRAMUSCULAR | Status: DC | PRN
  Administered 2019-07-22: 40 mg via INTRAVENOUS

## 2019-07-22 MED ORDER — CHLORHEXIDINE GLUCONATE 4 % EX LIQD: 60.0000 mL | Freq: Once | CUTANEOUS | Status: DC

## 2019-07-22 MED ORDER — CELECOXIB 200 MG PO CAPS: 200.0000 mg | ORAL_CAPSULE | Freq: Once | ORAL | Status: AC

## 2019-07-22 MED ORDER — PROPOFOL 10 MG/ML IV BOLUS
INTRAVENOUS | Status: AC
  Filled 2019-07-22: qty 20

## 2019-07-22 MED ORDER — SODIUM CHLORIDE 0.9 % IV SOLN
INTRAVENOUS | Status: DC | PRN
  Administered 2019-07-22: 500 mL

## 2019-07-22 MED ORDER — MIDAZOLAM HCL 2 MG/2ML IJ SOLN
INTRAMUSCULAR | Status: AC
  Filled 2019-07-22: qty 2

## 2019-07-22 MED ORDER — SODIUM CHLORIDE 0.9 % IV SOLN: INTRAVENOUS | Status: DC

## 2019-07-22 MED ORDER — MIDAZOLAM HCL 5 MG/5ML IJ SOLN
INTRAMUSCULAR | Status: DC | PRN
  Administered 2019-07-22 (×2): 1 mg via INTRAVENOUS

## 2019-07-22 MED ORDER — EPHEDRINE 5 MG/ML INJ
INTRAVENOUS | Status: AC
  Filled 2019-07-22: qty 10

## 2019-07-22 MED ORDER — PROPOFOL 500 MG/50ML IV EMUL
INTRAVENOUS | Status: DC | PRN
  Administered 2019-07-22: 75 ug/kg/min via INTRAVENOUS

## 2019-07-22 MED ORDER — DEXAMETHASONE SODIUM PHOSPHATE 10 MG/ML IJ SOLN
INTRAMUSCULAR | Status: DC | PRN
  Administered 2019-07-22: 4 mg via INTRAVENOUS

## 2019-07-22 MED ORDER — FENTANYL CITRATE (PF) 250 MCG/5ML IJ SOLN
INTRAMUSCULAR | Status: DC | PRN
  Administered 2019-07-22: 50 ug via INTRAVENOUS

## 2019-07-22 MED ORDER — LIDOCAINE 2% (20 MG/ML) 5 ML SYRINGE
INTRAMUSCULAR | Status: AC
  Filled 2019-07-22: qty 5

## 2019-07-22 MED ORDER — CELECOXIB 200 MG PO CAPS
ORAL_CAPSULE | ORAL | Status: AC
  Administered 2019-07-22: 200 mg via ORAL
  Filled 2019-07-22: qty 1

## 2019-07-22 MED ORDER — EPHEDRINE SULFATE-NACL 50-0.9 MG/10ML-% IV SOSY
PREFILLED_SYRINGE | INTRAVENOUS | Status: DC | PRN
  Administered 2019-07-22 (×3): 10 mg via INTRAVENOUS
  Administered 2019-07-22: 15 mg via INTRAVENOUS
  Administered 2019-07-22: 5 mg via INTRAVENOUS

## 2019-07-22 MED ORDER — ONDANSETRON HCL 4 MG/2ML IJ SOLN
INTRAMUSCULAR | Status: AC
  Filled 2019-07-22: qty 2

## 2019-07-22 MED ORDER — FENTANYL CITRATE (PF) 250 MCG/5ML IJ SOLN
INTRAMUSCULAR | Status: AC
  Filled 2019-07-22: qty 5

## 2019-07-22 MED ORDER — FENTANYL CITRATE (PF) 100 MCG/2ML IJ SOLN: 25.0000 ug | INTRAMUSCULAR | Status: DC | PRN

## 2019-07-22 MED ORDER — LIDOCAINE-EPINEPHRINE 0.5 %-1:200000 IJ SOLN
INTRAMUSCULAR | Status: AC
  Filled 2019-07-22: qty 1

## 2019-07-22 MED ORDER — 0.9 % SODIUM CHLORIDE (POUR BTL) OPTIME
TOPICAL | Status: DC | PRN
  Administered 2019-07-22: 1000 mL

## 2019-07-22 MED ORDER — ONDANSETRON HCL 4 MG/2ML IJ SOLN
INTRAMUSCULAR | Status: DC | PRN
  Administered 2019-07-22: 4 mg via INTRAVENOUS

## 2019-07-22 MED ORDER — HYDROCODONE-ACETAMINOPHEN 5-325 MG PO TABS: 1.0000 | ORAL_TABLET | Freq: Four times a day (QID) | ORAL | 0 refills | Status: DC | PRN

## 2019-07-22 MED ORDER — PROPOFOL 10 MG/ML IV BOLUS
INTRAVENOUS | Status: DC | PRN
  Administered 2019-07-22: 20 mg via INTRAVENOUS

## 2019-07-22 MED ORDER — ACETAMINOPHEN 500 MG PO TABS
1000.0000 mg | ORAL_TABLET | Freq: Once | ORAL | Status: AC
  Administered 2019-07-22: 09:00:00 1000 mg via ORAL
  Filled 2019-07-22: qty 2

## 2019-07-22 MED ORDER — PHENYLEPHRINE HCL-NACL 10-0.9 MG/250ML-% IV SOLN
INTRAVENOUS | Status: DC | PRN
  Administered 2019-07-22: 15 ug/min via INTRAVENOUS

## 2019-07-22 MED ORDER — SODIUM CHLORIDE 0.9 % IV SOLN
INTRAVENOUS | Status: AC
  Filled 2019-07-22: qty 1.2

## 2019-07-22 MED ORDER — DEXAMETHASONE SODIUM PHOSPHATE 10 MG/ML IJ SOLN
INTRAMUSCULAR | Status: AC
  Filled 2019-07-22: qty 1

## 2019-07-22 MED ORDER — LIDOCAINE-EPINEPHRINE 0.5 %-1:200000 IJ SOLN
INTRAMUSCULAR | Status: DC | PRN
  Administered 2019-07-22: 16 mL

## 2019-07-22 MED ORDER — LIDOCAINE-EPINEPHRINE 2 %-1:100000 IJ SOLN
INTRAMUSCULAR | Status: AC
  Filled 2019-07-22: qty 1

## 2019-07-22 SURGICAL SUPPLY — 28 items
ADH SKN CLS APL DERMABOND .7 (GAUZE/BANDAGES/DRESSINGS) ×1
ARMBAND PINK RESTRICT EXTREMIT (MISCELLANEOUS) ×3 IMPLANT
CANISTER SUCT 3000ML PPV (MISCELLANEOUS) ×2 IMPLANT
CANNULA VESSEL 3MM 2 BLNT TIP (CANNULA) ×2 IMPLANT
CLIP LIGATING EXTRA MED SLVR (CLIP) ×2 IMPLANT
CLIP LIGATING EXTRA SM BLUE (MISCELLANEOUS) ×2 IMPLANT
COVER PROBE W GEL 5X96 (DRAPES) ×2 IMPLANT
COVER WAND RF STERILE (DRAPES) ×1 IMPLANT
DECANTER SPIKE VIAL GLASS SM (MISCELLANEOUS) ×1 IMPLANT
DERMABOND ADVANCED (GAUZE/BANDAGES/DRESSINGS) ×1
DERMABOND ADVANCED .7 DNX12 (GAUZE/BANDAGES/DRESSINGS) ×1 IMPLANT
ELECT REM PT RETURN 9FT ADLT (ELECTROSURGICAL) ×2
ELECTRODE REM PT RTRN 9FT ADLT (ELECTROSURGICAL) ×1 IMPLANT
GLOVE SS BIOGEL STRL SZ 7.5 (GLOVE) ×1 IMPLANT
GLOVE SUPERSENSE BIOGEL SZ 7.5 (GLOVE) ×1
GOWN STRL REUS W/ TWL LRG LVL3 (GOWN DISPOSABLE) ×3 IMPLANT
GOWN STRL REUS W/TWL LRG LVL3 (GOWN DISPOSABLE) ×6
KIT BASIN OR (CUSTOM PROCEDURE TRAY) ×2 IMPLANT
KIT TURNOVER KIT B (KITS) ×2 IMPLANT
NS IRRIG 1000ML POUR BTL (IV SOLUTION) ×2 IMPLANT
PACK CV ACCESS (CUSTOM PROCEDURE TRAY) ×2 IMPLANT
PAD ARMBOARD 7.5X6 YLW CONV (MISCELLANEOUS) ×4 IMPLANT
SUT PROLENE 6 0 CC (SUTURE) ×2 IMPLANT
SUT VIC AB 3-0 SH 27 (SUTURE) ×2
SUT VIC AB 3-0 SH 27X BRD (SUTURE) ×1 IMPLANT
TOWEL GREEN STERILE (TOWEL DISPOSABLE) ×2 IMPLANT
UNDERPAD 30X30 (UNDERPADS AND DIAPERS) ×2 IMPLANT
WATER STERILE IRR 1000ML POUR (IV SOLUTION) ×2 IMPLANT

## 2019-07-22 NOTE — Interval H&P Note (Signed)
History and Physical Interval Note:  07/22/2019 7:53 AM  Janet Mitchell  has presented today for surgery, with the diagnosis of END STAGE RENAL DISEASE.  The various methods of treatment have been discussed with the patient and family. After consideration of risks, benefits and other options for treatment, the patient has consented to  Procedure(s): LEFT ARM ARTERIOVENOUS (AV) FISTULA CREATION (Left) as a surgical intervention.  The patient's history has been reviewed, patient examined, no change in status, stable for surgery.  I have reviewed the patient's chart and labs.  Questions were answered to the patient's satisfaction.     Curt Jews

## 2019-07-22 NOTE — Discharge Instructions (Signed)
° °  [facility] ° °Discharge Instructions ° °AV Fistula or Graft Surgery for Dialysis Access ° °Please refer to the following instructions for your post-procedure care. Your surgeon or physician assistant will discuss any changes with you. ° °Activity ° °You may drive the day following your surgery, if you are comfortable and no longer taking prescription pain medication. Resume full activity as the soreness in your incision resolves. ° °Bathing/Showering ° °You may shower after you go home. Keep your incision dry for 48 hours. Do not soak in a bathtub, hot tub, or swim until the incision heals completely. You may not shower if you have a hemodialysis catheter. ° °Incision Care ° °Clean your incision with mild soap and water after 48 hours. Pat the area dry with a clean towel. You do not need a bandage unless otherwise instructed. Do not apply any ointments or creams to your incision. You may have skin glue on your incision. Do not peel it off. It will come off on its own in about one week. Your arm may swell a bit after surgery. To reduce swelling use pillows to elevate your arm so it is above your heart. Your doctor will tell you if you need to lightly wrap your arm with an ACE bandage. ° °Diet ° °Resume your normal diet. There are not special food restrictions following this procedure. In order to heal from your surgery, it is CRITICAL to get adequate nutrition. Your body requires vitamins, minerals, and protein. Vegetables are the best source of vitamins and minerals. Vegetables also provide the perfect balance of protein. Processed food has little nutritional value, so try to avoid this. ° °Medications ° °Resume taking all of your medications. If your incision is causing pain, you may take over-the counter pain relievers such as acetaminophen (Tylenol). If you were prescribed a stronger pain medication, please be aware these medications can cause nausea and constipation. Prevent  nausea by taking the medication with a snack or meal. Avoid constipation by drinking plenty of fluids and eating foods with high amount of fiber, such as fruits, vegetables, and grains. Do not take Tylenol if you are taking prescription pain medications. ° ° ° ° °Follow up °Your surgeon may want to see you in the office following your access surgery. If so, this will be arranged at the time of your surgery. ° °Please call us immediately for any of the following conditions: ° °Increased pain, redness, drainage (pus) from your incision site °Fever of 101 degrees or higher °Severe or worsening pain at your incision site °Hand pain or numbness. ° °Reduce your risk of vascular disease: ° °Stop smoking. If you would like help, call [facility] at 1-800-QUIT-NOW (1-800-784-8669) or [place] at [phone] ° °Manage your cholesterol °Maintain a desired weight °Control your diabetes °Keep your blood pressure down ° °Dialysis ° °It will take several weeks to several months for your new dialysis access to be ready for use. Your surgeon will determine when it is OK to use it. Your nephrologist will continue to direct your dialysis. You can continue to use your Permcath until your new access is ready for use. ° °If you have any questions, please call the office at [phone]. ° °

## 2019-07-22 NOTE — Anesthesia Postprocedure Evaluation (Signed)
Anesthesia Post Note  Patient: Janet Mitchell  Procedure(s) Performed: LEFT ARM BASILIC ARTERIOVENOUS (AV) FISTULA CREATION (Left Arm Upper)     Patient location during evaluation: PACU Anesthesia Type: MAC Level of consciousness: awake and alert Pain management: pain level controlled Vital Signs Assessment: post-procedure vital signs reviewed and stable Respiratory status: spontaneous breathing and respiratory function stable Cardiovascular status: stable Postop Assessment: no apparent nausea or vomiting Anesthetic complications: no    Last Vitals:  Vitals:   07/22/19 1150 07/22/19 1205  BP: (!) 162/81 (!) 152/64  Pulse: 62 63  Resp: 15 15  Temp:  36.8 C  SpO2: 98% 97%    Last Pain:  Vitals:   07/22/19 1150  TempSrc:   PainSc: 0-No pain                 [name] DANIEL

## 2019-07-22 NOTE — Anesthesia Procedure Notes (Signed)
Procedure Name: MAC Date/Time: 07/22/2019 9:17 AM Performed by: Colin Benton, CRNA Pre-anesthesia Checklist: Patient identified, Emergency Drugs available, Suction available and Patient being monitored Patient Re-evaluated:Patient Re-evaluated prior to induction Oxygen Delivery Method: Simple face mask Placement Confirmation: positive ETCO2 Dental Injury: Teeth and Oropharynx as per pre-operative assessment

## 2019-07-22 NOTE — Op Note (Signed)
° ° °  OPERATIVE REPORT  DATE OF SURGERY: 07/22/2019  PATIENT: Janet Mitchell, 54 y.o. female MRN: [id number]  DOB: [birth year]  PRE-OPERATIVE DIAGNOSIS: End-stage renal disease  POST-OPERATIVE DIAGNOSIS:  Same  PROCEDURE: Left first stage brachiobasilic fistula  SURGEON:  Curt Jews, M.D.  PHYSICIAN ASSISTANT: Matt Eveland, PA-C  ANESTHESIA: Local with sedation  EBL: per anesthesia record  Total I/O In: 16 [IV Piggyback:50] Out: 15 [Blood:15]  BLOOD ADMINISTERED: none  DRAINS: none  SPECIMEN: none  COUNTS CORRECT:  YES  PATIENT DISPOSITION:  PACU - hemodynamically stable  PROCEDURE DETAILS: The patient was taken the operating placed to abscess where the area of the left arm is prepped draped you sterile fashion.  SonoSite ultrasound was used to visualize the veins.  The patient had extremely small cephalic veins.  She did have a large caliber basilic vein.  This was on the medial aspect of the elbow.  On imaging with ultrasound it was noted that the patient had extreme circumferential calcification of her brachial artery throughout the upper arm and at the antecubital space.  She did have a radial pulse.  The basilic vein was exposed with an incision over the medial aspect of the elbow.  It was a very good caliber.  Tributary branches were ligated with 3-0 and 4-0 silk ties and divided.  A separate incision was made over the brachial artery and carried down to isolate the artery.  It was of normal size and was extremely calcified.  The basilic vein was ligated distally and divided and was gently dilated and was of excellent caliber.  The vein was brought through the subcutaneous tunnel to the level of the brachial artery.  The artery was occluded proximally distally with Serafin clamps and was opened with an 11 blade and sent longitudinally with Potts scissors.  There was severe calcification.  The vein was cut to the appropriate length and spatulated and sewn end-to-side to the  artery with a running 6-0 Prolene suture.  A small arteriotomy was created to reduce risk of steal.  The anastomosis completed and clamps removed and excellent thrill was noted through the vein.  There was audible flow at the wrist with the radial and the ulnar artery.  This was augmented with compression of the vein.  The wounds irrigated with saline.  Hemostasis left cautery.  The wounds were closed with 3-0 Vicryl in the subcutaneous and subcuticular tissue.  Sterile dressing was applied and the patient was transferred to the recovery room stable condition.  I do feel there is significant risk of steal and I counseled the patient's husband regarding this and to notify us immediately should she develop any numbness or weakness in her hand.  On her second stage translocation, I would recommend not exposing the brachial artery but would simply divide the vein near the anastomosis and so the vein into into itself after translocation   Janet Mitchell, M.D., [facility] 07/22/2019 11:37 AM

## 2019-07-22 NOTE — Transfer of Care (Signed)
Immediate Anesthesia Transfer of Care Note  Patient: Janet Mitchell  Procedure(s) Performed: LEFT ARM BASILIC ARTERIOVENOUS (AV) FISTULA CREATION (Left Arm Upper)  Patient Location: PACU  Anesthesia Type:MAC  Level of Consciousness: drowsy  Airway & Oxygen Therapy: Patient Spontanous Breathing and Patient connected to face mask oxygen  Post-op Assessment: Report given to RN and Post -op Vital signs reviewed and stable  Post vital signs: Reviewed and stable  Last Vitals:  Vitals Value Taken Time  BP 126/87 07/22/19 1050  Temp    Pulse 61 07/22/19 1052  Resp 11 07/22/19 1052  SpO2 100 % 07/22/19 1052  Vitals shown include unvalidated device data.  Last Pain:  Vitals:   07/22/19 0759  TempSrc: Oral         Complications: No apparent anesthesia complications

## 2019-07-23 DIAGNOSIS — N186 End stage renal disease: Secondary | ICD-10-CM | POA: Diagnosis not present

## 2019-07-23 DIAGNOSIS — Z992 Dependence on renal dialysis: Secondary | ICD-10-CM | POA: Diagnosis not present

## 2019-07-23 DIAGNOSIS — D509 Iron deficiency anemia, unspecified: Secondary | ICD-10-CM | POA: Diagnosis not present

## 2019-07-23 DIAGNOSIS — K659 Peritonitis, unspecified: Secondary | ICD-10-CM | POA: Diagnosis not present

## 2019-07-23 DIAGNOSIS — B957 Other staphylococcus as the cause of diseases classified elsewhere: Secondary | ICD-10-CM | POA: Diagnosis not present

## 2019-07-23 DIAGNOSIS — N2581 Secondary hyperparathyroidism of renal origin: Secondary | ICD-10-CM | POA: Diagnosis not present

## 2019-07-23 DIAGNOSIS — Z1611 Resistance to penicillins: Secondary | ICD-10-CM | POA: Diagnosis not present

## 2019-07-23 DIAGNOSIS — D631 Anemia in chronic kidney disease: Secondary | ICD-10-CM | POA: Diagnosis not present

## 2019-07-24 DIAGNOSIS — N186 End stage renal disease: Secondary | ICD-10-CM | POA: Diagnosis not present

## 2019-07-24 DIAGNOSIS — N2581 Secondary hyperparathyroidism of renal origin: Secondary | ICD-10-CM | POA: Diagnosis not present

## 2019-07-24 DIAGNOSIS — K659 Peritonitis, unspecified: Secondary | ICD-10-CM | POA: Diagnosis not present

## 2019-07-24 DIAGNOSIS — D509 Iron deficiency anemia, unspecified: Secondary | ICD-10-CM | POA: Diagnosis not present

## 2019-07-24 DIAGNOSIS — D631 Anemia in chronic kidney disease: Secondary | ICD-10-CM | POA: Diagnosis not present

## 2019-07-24 DIAGNOSIS — Z992 Dependence on renal dialysis: Secondary | ICD-10-CM | POA: Diagnosis not present

## 2019-07-25 DIAGNOSIS — T8571XD Infection and inflammatory reaction due to peritoneal dialysis catheter, subsequent encounter: Secondary | ICD-10-CM | POA: Diagnosis not present

## 2019-07-25 DIAGNOSIS — K659 Peritonitis, unspecified: Secondary | ICD-10-CM | POA: Diagnosis not present

## 2019-07-25 DIAGNOSIS — Z48812 Encounter for surgical aftercare following surgery on the circulatory system: Secondary | ICD-10-CM | POA: Diagnosis not present

## 2019-07-25 DIAGNOSIS — D509 Iron deficiency anemia, unspecified: Secondary | ICD-10-CM | POA: Diagnosis not present

## 2019-07-25 DIAGNOSIS — I132 Hypertensive heart and chronic kidney disease with heart failure and with stage 5 chronic kidney disease, or end stage renal disease: Secondary | ICD-10-CM | POA: Diagnosis not present

## 2019-07-25 DIAGNOSIS — I509 Heart failure, unspecified: Secondary | ICD-10-CM | POA: Diagnosis not present

## 2019-07-25 DIAGNOSIS — N186 End stage renal disease: Secondary | ICD-10-CM | POA: Diagnosis not present

## 2019-07-25 DIAGNOSIS — D631 Anemia in chronic kidney disease: Secondary | ICD-10-CM | POA: Diagnosis not present

## 2019-07-25 DIAGNOSIS — E1122 Type 2 diabetes mellitus with diabetic chronic kidney disease: Secondary | ICD-10-CM | POA: Diagnosis not present

## 2019-07-25 DIAGNOSIS — B957 Other staphylococcus as the cause of diseases classified elsewhere: Secondary | ICD-10-CM | POA: Diagnosis not present

## 2019-07-26 ENCOUNTER — Other Ambulatory Visit: Payer: Self-pay

## 2019-07-26 ENCOUNTER — Telehealth: Payer: Self-pay | Admitting: *Deleted

## 2019-07-26 ENCOUNTER — Other Ambulatory Visit (HOSPITAL_COMMUNITY)
Admission: RE | Admit: 2019-07-26 | Discharge: 2019-07-26 | Disposition: A | Discharge status: Home / Self Care | Payer: Medicare Other | Attending: Medical | Admitting: Medical

## 2019-07-26 ENCOUNTER — Inpatient Hospital Stay (HOSPITAL_COMMUNITY)
Admission: EM | Admit: 2019-07-26 | Discharge: 2019-07-30 | DRG: 919 | Disposition: A | Discharge status: Home Health | Payer: Medicare Other | Source: Other Acute Inpatient Hospital | Attending: Internal Medicine | Admitting: Internal Medicine

## 2019-07-26 ENCOUNTER — Emergency Department (HOSPITAL_COMMUNITY): Payer: Medicare Other

## 2019-07-26 ENCOUNTER — Telehealth: Payer: Self-pay | Admitting: Obstetrics and Gynecology

## 2019-07-26 DIAGNOSIS — K668 Other specified disorders of peritoneum: Secondary | ICD-10-CM | POA: Diagnosis present

## 2019-07-26 DIAGNOSIS — D631 Anemia in chronic kidney disease: Secondary | ICD-10-CM | POA: Diagnosis present

## 2019-07-26 DIAGNOSIS — I700 Atherosclerosis of aorta: Secondary | ICD-10-CM | POA: Diagnosis not present

## 2019-07-26 DIAGNOSIS — K219 Gastro-esophageal reflux disease without esophagitis: Secondary | ICD-10-CM | POA: Diagnosis present

## 2019-07-26 DIAGNOSIS — Z8719 Personal history of other diseases of the digestive system: Secondary | ICD-10-CM

## 2019-07-26 DIAGNOSIS — Z9841 Cataract extraction status, right eye: Secondary | ICD-10-CM | POA: Diagnosis not present

## 2019-07-26 DIAGNOSIS — Z961 Presence of intraocular lens: Secondary | ICD-10-CM | POA: Diagnosis present

## 2019-07-26 DIAGNOSIS — T8571XA Infection and inflammatory reaction due to peritoneal dialysis catheter, initial encounter: Secondary | ICD-10-CM | POA: Diagnosis present

## 2019-07-26 DIAGNOSIS — I120 Hypertensive chronic kidney disease with stage 5 chronic kidney disease or end stage renal disease: Secondary | ICD-10-CM | POA: Diagnosis present

## 2019-07-26 DIAGNOSIS — N186 End stage renal disease: Secondary | ICD-10-CM | POA: Diagnosis not present

## 2019-07-26 DIAGNOSIS — R718 Other abnormality of red blood cells: Secondary | ICD-10-CM | POA: Diagnosis not present

## 2019-07-26 DIAGNOSIS — Z96642 Presence of left artificial hip joint: Secondary | ICD-10-CM | POA: Diagnosis present

## 2019-07-26 DIAGNOSIS — Z992 Dependence on renal dialysis: Secondary | ICD-10-CM | POA: Diagnosis not present

## 2019-07-26 DIAGNOSIS — E1122 Type 2 diabetes mellitus with diabetic chronic kidney disease: Secondary | ICD-10-CM | POA: Diagnosis not present

## 2019-07-26 DIAGNOSIS — Z6841 Body Mass Index (BMI) 40.0 and over, adult: Secondary | ICD-10-CM

## 2019-07-26 DIAGNOSIS — Z975 Presence of (intrauterine) contraceptive device: Secondary | ICD-10-CM

## 2019-07-26 DIAGNOSIS — E1165 Type 2 diabetes mellitus with hyperglycemia: Secondary | ICD-10-CM

## 2019-07-26 DIAGNOSIS — N250 Renal osteodystrophy: Secondary | ICD-10-CM | POA: Diagnosis not present

## 2019-07-26 DIAGNOSIS — I132 Hypertensive heart and chronic kidney disease with heart failure and with stage 5 chronic kidney disease, or end stage renal disease: Secondary | ICD-10-CM | POA: Diagnosis not present

## 2019-07-26 DIAGNOSIS — E11311 Type 2 diabetes mellitus with unspecified diabetic retinopathy with macular edema: Secondary | ICD-10-CM | POA: Diagnosis present

## 2019-07-26 DIAGNOSIS — T85838A Hemorrhage due to other internal prosthetic devices, implants and grafts, initial encounter: Principal | ICD-10-CM | POA: Diagnosis present

## 2019-07-26 DIAGNOSIS — D62 Acute posthemorrhagic anemia: Secondary | ICD-10-CM | POA: Diagnosis not present

## 2019-07-26 DIAGNOSIS — J849 Interstitial pulmonary disease, unspecified: Secondary | ICD-10-CM | POA: Diagnosis present

## 2019-07-26 DIAGNOSIS — Z832 Family history of diseases of the blood and blood-forming organs and certain disorders involving the immune mechanism: Secondary | ICD-10-CM

## 2019-07-26 DIAGNOSIS — Z841 Family history of disorders of kidney and ureter: Secondary | ICD-10-CM

## 2019-07-26 DIAGNOSIS — Z89412 Acquired absence of left great toe: Secondary | ICD-10-CM

## 2019-07-26 DIAGNOSIS — E8779 Other fluid overload: Secondary | ICD-10-CM | POA: Diagnosis present

## 2019-07-26 DIAGNOSIS — Z79899 Other long term (current) drug therapy: Secondary | ICD-10-CM

## 2019-07-26 DIAGNOSIS — Y712 Prosthetic and other implants, materials and accessory cardiovascular devices associated with adverse incidents: Secondary | ICD-10-CM | POA: Diagnosis present

## 2019-07-26 DIAGNOSIS — E1129 Type 2 diabetes mellitus with other diabetic kidney complication: Secondary | ICD-10-CM | POA: Diagnosis not present

## 2019-07-26 DIAGNOSIS — N2581 Secondary hyperparathyroidism of renal origin: Secondary | ICD-10-CM | POA: Diagnosis present

## 2019-07-26 DIAGNOSIS — K529 Noninfective gastroenteritis and colitis, unspecified: Secondary | ICD-10-CM | POA: Diagnosis present

## 2019-07-26 DIAGNOSIS — D509 Iron deficiency anemia, unspecified: Secondary | ICD-10-CM | POA: Diagnosis not present

## 2019-07-26 DIAGNOSIS — G43909 Migraine, unspecified, not intractable, without status migrainosus: Secondary | ICD-10-CM | POA: Diagnosis present

## 2019-07-26 DIAGNOSIS — Z03818 Encounter for observation for suspected exposure to other biological agents ruled out: Secondary | ICD-10-CM | POA: Diagnosis not present

## 2019-07-26 DIAGNOSIS — Z7982 Long term (current) use of aspirin: Secondary | ICD-10-CM

## 2019-07-26 DIAGNOSIS — Z9049 Acquired absence of other specified parts of digestive tract: Secondary | ICD-10-CM | POA: Diagnosis not present

## 2019-07-26 DIAGNOSIS — Y831 Surgical operation with implant of artificial internal device as the cause of abnormal reaction of the patient, or of later complication, without mention of misadventure at the time of the procedure: Secondary | ICD-10-CM | POA: Diagnosis present

## 2019-07-26 DIAGNOSIS — Z6837 Body mass index (BMI) 37.0-37.9, adult: Secondary | ICD-10-CM

## 2019-07-26 DIAGNOSIS — Z8616 Personal history of COVID-19: Secondary | ICD-10-CM

## 2019-07-26 DIAGNOSIS — K659 Peritonitis, unspecified: Secondary | ICD-10-CM | POA: Diagnosis not present

## 2019-07-26 DIAGNOSIS — E8770 Fluid overload, unspecified: Secondary | ICD-10-CM | POA: Diagnosis not present

## 2019-07-26 DIAGNOSIS — IMO0002 Reserved for concepts with insufficient information to code with codable children: Secondary | ICD-10-CM | POA: Diagnosis present

## 2019-07-26 DIAGNOSIS — Z794 Long term (current) use of insulin: Secondary | ICD-10-CM

## 2019-07-26 DIAGNOSIS — Z8249 Family history of ischemic heart disease and other diseases of the circulatory system: Secondary | ICD-10-CM

## 2019-07-26 DIAGNOSIS — Z7722 Contact with and (suspected) exposure to environmental tobacco smoke (acute) (chronic): Secondary | ICD-10-CM | POA: Diagnosis present

## 2019-07-26 DIAGNOSIS — I5030 Unspecified diastolic (congestive) heart failure: Secondary | ICD-10-CM | POA: Diagnosis not present

## 2019-07-26 DIAGNOSIS — Z98891 History of uterine scar from previous surgery: Secondary | ICD-10-CM

## 2019-07-26 LAB — CBC WITH DIFFERENTIAL/PLATELET
Abs Immature Granulocytes: 0.07 10*3/uL (ref 0.00–0.07)
Basophils Absolute: 0.1 10*3/uL (ref 0.0–0.1)
Basophils Relative: 1 %
Eosinophils Absolute: 0.5 10*3/uL (ref 0.0–0.5)
Eosinophils Relative: 6 %
HCT: 16.1 % — ABNORMAL LOW (ref 36.0–46.0)
Hemoglobin: 4.7 g/dL — CL (ref 12.0–15.0)
Immature Granulocytes: 1 %
Lymphocytes Relative: 18 %
Lymphs Abs: 1.7 10*3/uL (ref 0.7–4.0)
MCH: 23.7 pg — ABNORMAL LOW (ref 26.0–34.0)
MCHC: 29.2 g/dL — ABNORMAL LOW (ref 30.0–36.0)
MCV: 81.3 fL (ref 80.0–100.0)
Monocytes Absolute: 1.3 10*3/uL — ABNORMAL HIGH (ref 0.1–1.0)
Monocytes Relative: 14 %
Neutro Abs: 5.8 10*3/uL (ref 1.7–7.7)
Neutrophils Relative %: 60 %
Platelets: 227 10*3/uL (ref 150–400)
RBC: 1.98 MIL/uL — ABNORMAL LOW (ref 3.87–5.11)
RDW: 23.1 % — ABNORMAL HIGH (ref 11.5–15.5)
WBC: 9.5 10*3/uL (ref 4.0–10.5)
nRBC: 0 % (ref 0.0–0.2)

## 2019-07-26 LAB — BASIC METABOLIC PANEL
Anion gap: 11 (ref 5–15)
BUN: 34 mg/dL — ABNORMAL HIGH (ref 6–20)
CO2: 27 mmol/L (ref 22–32)
Calcium: 8.5 mg/dL — ABNORMAL LOW (ref 8.9–10.3)
Chloride: 98 mmol/L (ref 98–111)
Creatinine, Ser: 5.86 mg/dL — ABNORMAL HIGH (ref 0.44–1.00)
GFR calc Af Amer: 9 mL/min — ABNORMAL LOW (ref >60)
GFR calc non Af Amer: 8 mL/min — ABNORMAL LOW (ref >60)
Glucose, Bld: 187 mg/dL — ABNORMAL HIGH (ref 70–99)
Potassium: 4.3 mmol/L (ref 3.5–5.1)
Sodium: 136 mmol/L (ref 135–145)

## 2019-07-26 LAB — HCG, QUANTITATIVE, PREGNANCY: hCG, Beta Chain, Quant, S: 2 m[IU]/mL (ref <5)

## 2019-07-26 LAB — RESPIRATORY PANEL BY RT PCR (FLU A&B, COVID)
Influenza A by PCR: NEGATIVE
Influenza B by PCR: NEGATIVE
SARS Coronavirus 2 by RT PCR: NEGATIVE

## 2019-07-26 LAB — BPAM RBC
Blood Product Expiration Date: 202105262359
Blood Product Expiration Date: 202105282359
Unit Type and Rh: 6200
Unit Type and Rh: 6200

## 2019-07-26 LAB — PREPARE RBC (CROSSMATCH)

## 2019-07-26 LAB — TYPE AND SCREEN
ABO/RH(D): A POS
Antibody Screen: NEGATIVE
Donor AG Type: NEGATIVE
Donor AG Type: NEGATIVE
Unit division: 0
Unit division: 0

## 2019-07-26 LAB — HEMOGLOBIN AND HEMATOCRIT, BLOOD
HCT: 14.6 % — ABNORMAL LOW (ref 36.0–46.0)
Hemoglobin: 4.3 g/dL — CL (ref 12.0–15.0)

## 2019-07-26 LAB — POC OCCULT BLOOD, ED: Fecal Occult Bld: NEGATIVE

## 2019-07-26 IMAGING — CT CT ABD-PELV W/ CM
2 of 5 series · 16 of 46 positions shown, 18 images · IV contrast (Omnipaque or Isovue)
Comparison: [DATE], [DATE]

CLINICAL DATA: Anemia, dialysis

EXAM:
CT ABDOMEN AND PELVIS WITH CONTRAST
TECHNIQUE: Multidetector CT imaging of the abdomen and pelvis was performed
using the standard protocol following bolus administration of
intravenous contrast.
CONTRAST:  100mL OMNIPAQUE IOHEXOL 300 MG/ML  SOLN

[Series 2: axial st · axial · 0.91mm/px · z∈[+610,+1065]mm · 13 of 103 slices shown, 15 images]
[im 6/103  soft-tissue]
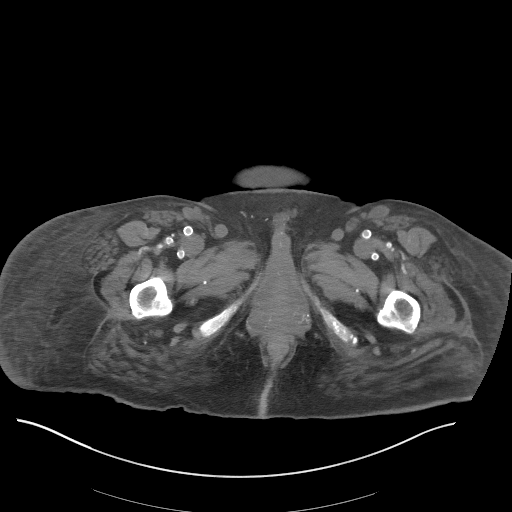
[im 6/103  bone]
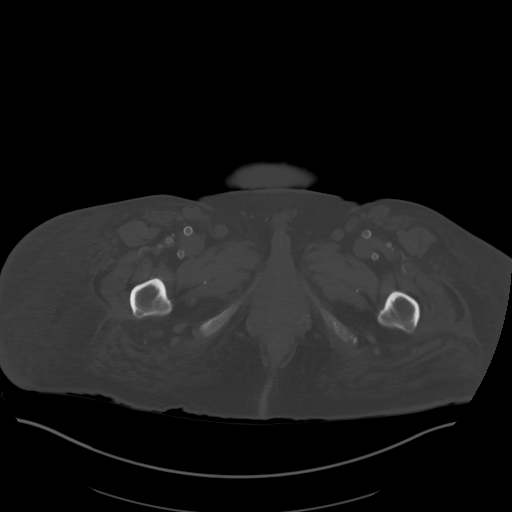
[im 12/103  soft-tissue]
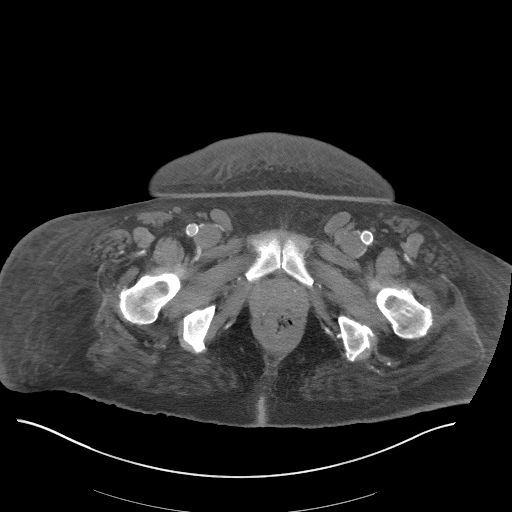
[im 23/103  soft-tissue]
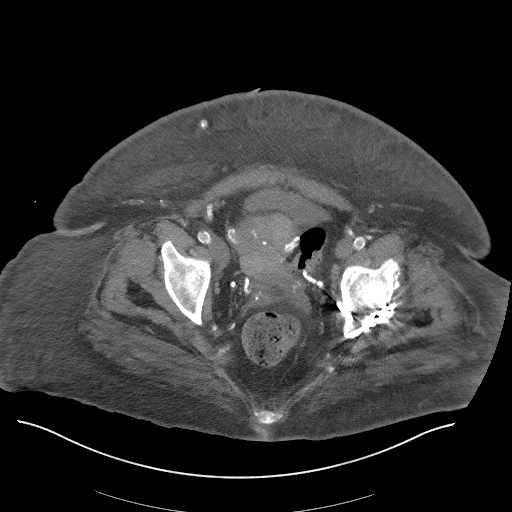
[im 29/103  soft-tissue]
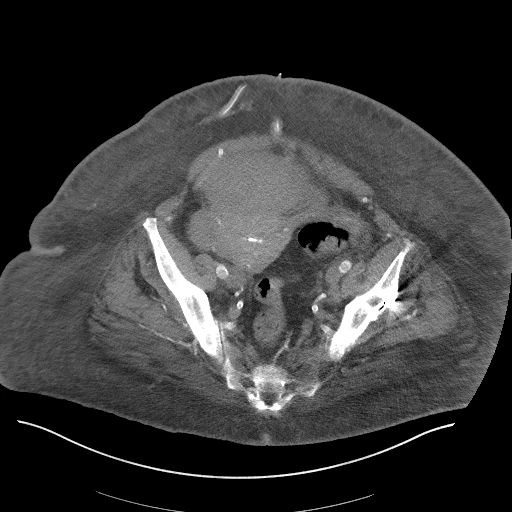
[im 35/103  soft-tissue]
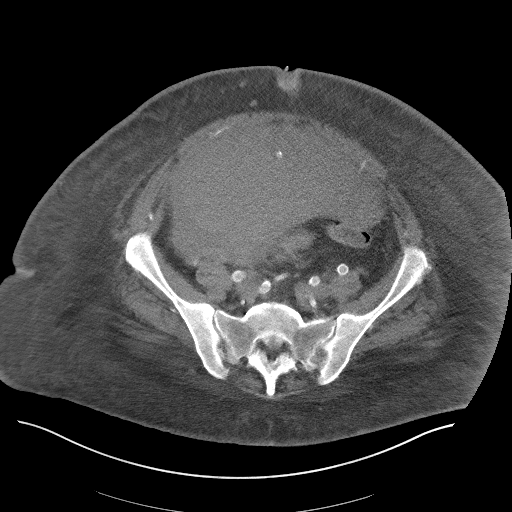
[im 46/103  soft-tissue]
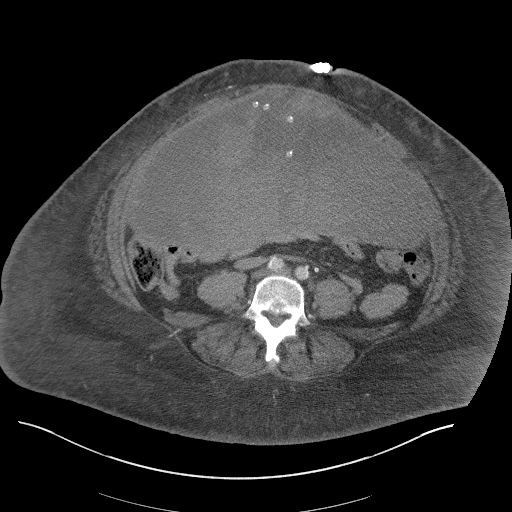
[im 52/103  soft-tissue]
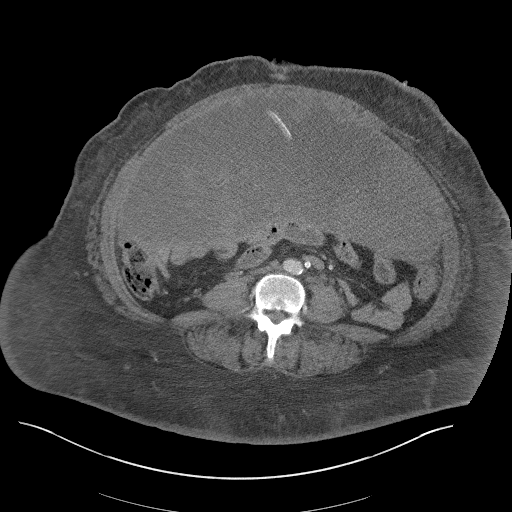
[im 57/103  soft-tissue]
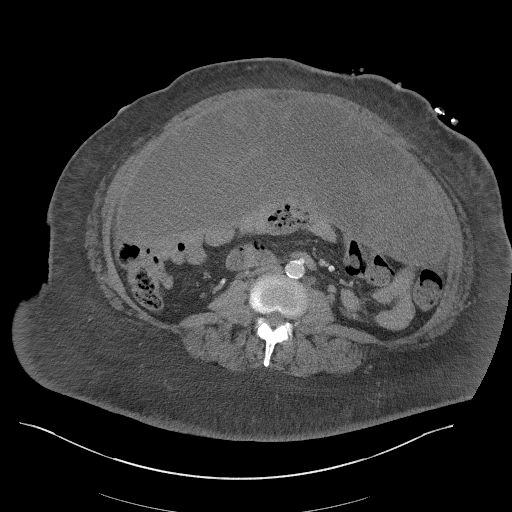
[im 69/103  soft-tissue]
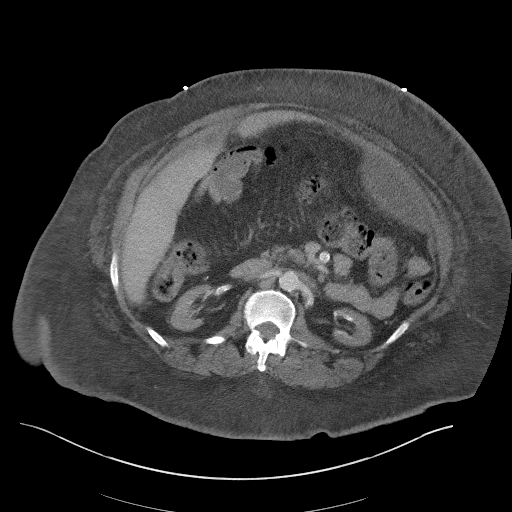
[im 69/103  bone]
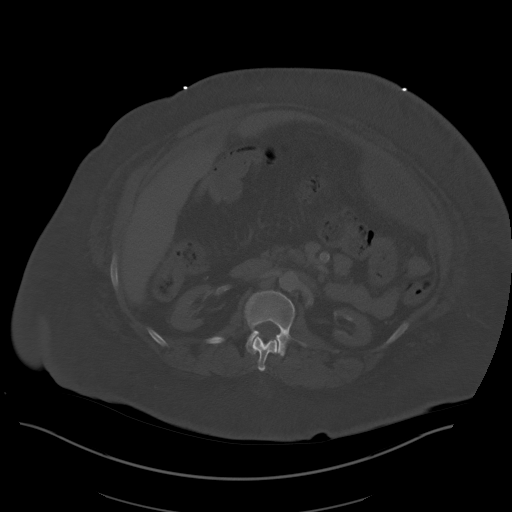
[im 74/103  soft-tissue]
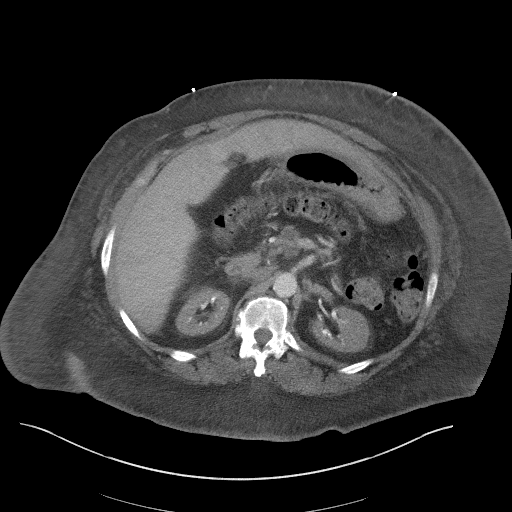
[im 80/103  soft-tissue]
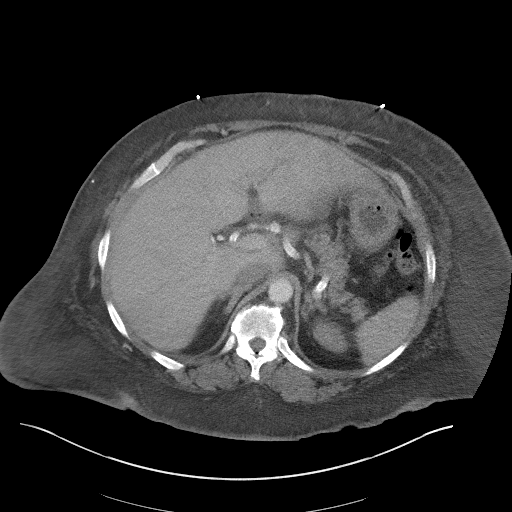
[im 91/103  soft-tissue]
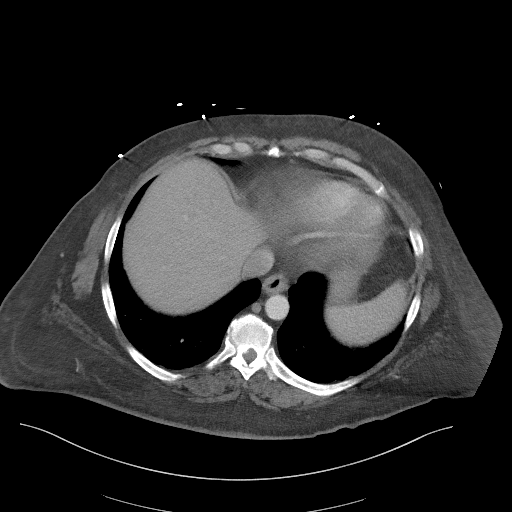
[im 97/103  soft-tissue]
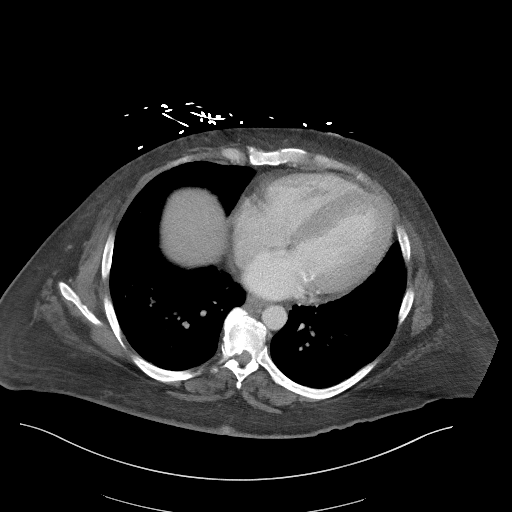

[Series 5: coronal st · coronal · 0.99mm/px · 3 of 123 slices shown]
[im 41/123  soft-tissue]
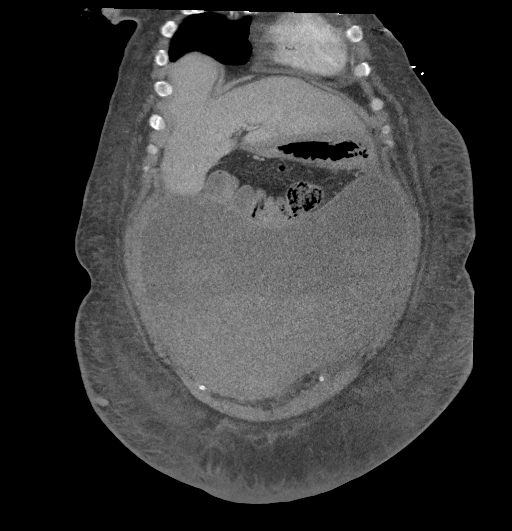
[im 55/123  soft-tissue]
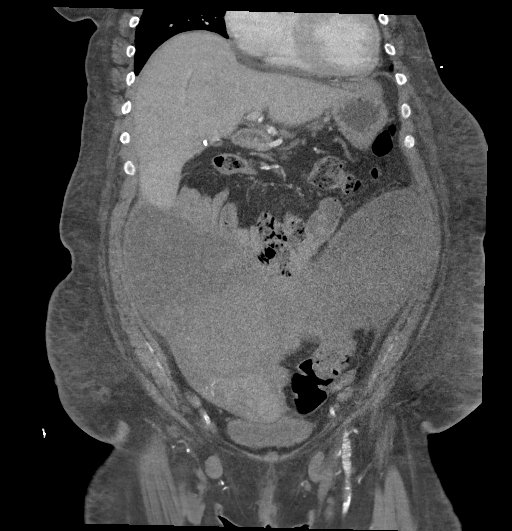
[im 68/123  soft-tissue]
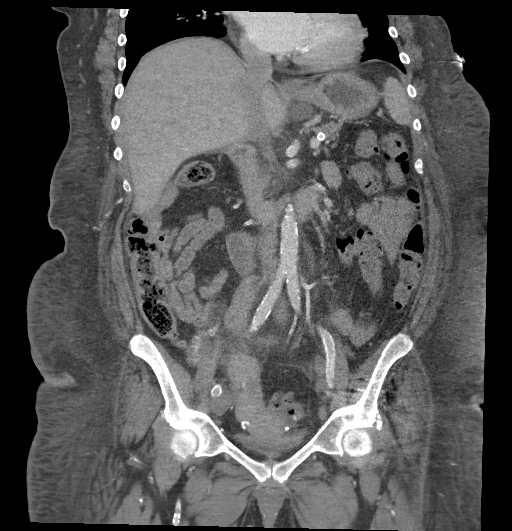

[16 of 46 positions shown; findings below may reference images not displayed]

FINDINGS: Lower chest: There is extensive bilateral ground-glass and irregular
interstitial opacity in the included lung bases. Cardiomegaly.
Coronary artery calcifications.

Hepatobiliary: No focal liver abnormality is seen. Status post
cholecystectomy. No biliary dilatation.

Pancreas: Unremarkable. No pancreatic ductal dilatation or
surrounding inflammatory changes.

Spleen: Normal in size without significant abnormality.

Adrenals/Urinary Tract: Adrenal glands are unremarkable. Atrophic
appearing kidneys. No hydronephrosis. Bladder is unremarkable.

Stomach/Bowel: Stomach is within normal limits. Appendix appears
normal. No evidence of bowel wall thickening, distention, or
inflammatory changes.

Vascular/Lymphatic: Aortic atherosclerosis. Extensive vascular
calcinosis. No enlarged abdominal or pelvic lymph nodes.

Reproductive: No mass or other significant abnormality.

Other: Anasarca. Interval enlargement of a large, loculated fluid
collection in the ventral abdomen, measuring approximately 29.0 x
14.0 x 21.0 cm, now containing extensive internal heterogeneous
attenuation. There is a tunneled Tenckhoff type peritoneal dialysis
catheter within this collection.

Musculoskeletal: Plate and screw fixation of the left acetabulum.
IMPRESSION: 1. Interval enlargement of a large, loculated fluid collection in
the ventral abdomen, measuring approximately 29.0 x 14.0 x 21.0 cm,
now containing extensive internal heterogeneous attenuation. This
appearance is most consistent with a pseudocyst in the setting of
peritoneal dialysis and concerning for internal hemorrhage given
report of anemia.

2. Extensive bilateral ground-glass and irregular interstitial
opacity in the included bilateral lung bases, similar to prior
examination and concerning for fibrotic interstitial lung disease
although incompletely characterized. Consider dedicated ILD protocol
imaging of the chest on a nonemergent basis.

3. Other chronic and incidental findings as above. Aortic
Atherosclerosis ([MD]-[MD]).

## 2019-07-26 MED ORDER — IOHEXOL 300 MG/ML  SOLN
100.0000 mL | Freq: Once | INTRAMUSCULAR | Status: AC | PRN
  Administered 2019-07-26: 16:00:00 100 mL via INTRAVENOUS

## 2019-07-26 MED ORDER — SODIUM CHLORIDE 0.9 % IV SOLN
10.0000 mL/h | Freq: Once | INTRAVENOUS | Status: AC
  Administered 2019-07-26: 17:00:00 10 mL/h via INTRAVENOUS

## 2019-07-26 MED ORDER — SODIUM CHLORIDE 0.9 % IV SOLN
0.3000 ug/kg | Freq: Once | INTRAVENOUS | Status: AC
  Administered 2019-07-26: 17:00:00 38 ug via INTRAVENOUS
  Filled 2019-07-26: qty 9.5

## 2019-07-26 NOTE — ED Notes (Signed)
Lab called and stated it would be at least 2 hrs before blood would be here.

## 2019-07-26 NOTE — Telephone Encounter (Signed)
Contacted by Estill Bamberg.

## 2019-07-26 NOTE — Telephone Encounter (Signed)
Pt currently in ER for low hemoglobin. Will send mychart message to her to call us to schedule appointment.

## 2019-07-26 NOTE — H&P (Signed)
TRH H&P   Patient Demographics:    Janet Mitchell, is a 54 y.o. female  MRN: [id number]   DOB - 02-18-1966  Admit Date - 07/26/2019  Outpatient Primary MD for the patient is Lucianne Lei, MD  Referring MD/NP/PA: PA Idol  Outpatient Specialists: Renal Dr Rosaland Lao at [place]  Patient coming from: Home  Chief Complaint  Patient presents with  . Abnormal Lab      HPI:    Janet Mitchell  is a 54 y.o. female, with past medical history of end-stage renal disease on peritoneal hemodialysis since 2017, recently transition to hemodialysis 4/21, hypertension, diabetes mellitus type 2 , patient with rehospitalization at [facility] secondary to peritoneal dialysis catheter peritonitis, which was treated, then she was started on dialysis, first session as an outpatient with this Monday, she presented to dialysis, her repeat CBC came back significant for drop in hemoglobin to 4.3, from baseline 9.2, patient herself only complains of fatigue, dyspnea at baseline, she denies any chest pain, dizziness, lightheadedness, syncope or presyncope, as well denies fever or chills, no melena, coffee-ground emesis, she reports mild periods where she used only 1 pad recently. - in ED repeat hemoglobin confirms anemia of 4.7, she had stat CT abdomen and pelvis which came back significant for loculated large heterogeneous fluid collection in abdomen 29 x 21 cm, likely related bleed, patient has 2 units PRBC ordered, pending units to come from [place] giving antibody present, hospitalist were consulted to admit.    Review of systems:    In addition to the HPI above,  No Fever-chills,she reports fatigue. No Headache, No changes with Vision or hearing, No problems swallowing food or Liquids, No Chest pain, Cough or Shortness of Breath, No Abdominal pain, No Nausea or Vommitting, Bowel movements  are regular, No Blood in stool or Urine, No dysuria, No new skin rashes or bruises, No new joints pains-aches,  No new weakness, tingling, numbness in any extremity, No recent weight gain or loss, No polyuria, polydypsia or polyphagia, No significant Mental Stressors.  A full 10 point Review of Systems was done, except as stated above, all other Review of Systems were negative.   With Past History of the following :    Past Medical History:  Diagnosis Date  . Anemia   . Arthritis   . CHF (congestive heart failure) ([place])    pt states she has been cleared of heart failure/disease  . Chronic cholecystitis with calculus   . COVID-19 virus infection 03/2019  . ESRD (end stage renal disease) on dialysis [facility])    "peritoneal dialysis q hs" (03/09/2018)  . Headache    "a few/wk" (03/09/2018) - no longer having these  . History of blood transfusion 10/2017   "low blood count" (03/09/2018)  . Hypertension   . Spinal headache   . Type II diabetes mellitus ([place])  Past Surgical History:  Procedure Laterality Date  . AMPUTATION TOE Left 2013   Great toe  . AV FISTULA PLACEMENT Left 07/22/2019   Procedure: LEFT ARM BASILIC ARTERIOVENOUS (AV) FISTULA CREATION;  Surgeon: Rosetta Posner, MD;  Location: [facility];  Service: Vascular;  Laterality: Left;  . BIOPSY  10/24/2018   Procedure: BIOPSY;  Surgeon: Daneil Dolin, MD;  [facility];  Service: Endoscopy;;  right and left colon  . CATARACT EXTRACTION W/ INTRAOCULAR LENS IMPLANT Right   . CESAREAN SECTION  1994; 1998  . CHOLECYSTECTOMY N/A 03/09/2018   Procedure: LAPAROSCOPIC CHOLECYSTECTOMY WITH INTRAOPERATIVE CHOLANGIOGRAM ERAS PATHWAY;  Surgeon: Donnie Mesa, MD;  Location: [facility];  Service: General;  Laterality: N/A;  . COLONOSCOPY N/A 10/24/2018   Procedure: COLONOSCOPY;  Surgeon: Daneil Dolin, MD;  [facility];  Service: Endoscopy;  Laterality: N/A;  1:00pm  . EYE SURGERY  05/15/2018   Removal of blood  in the globe (due to DM)  . FLEXIBLE SIGMOIDOSCOPY N/A 11/24/2017   Procedure: FLEXIBLE SIGMOIDOSCOPY;  Surgeon: Daneil Dolin, MD;  [facility];  Service: Endoscopy;  Laterality: N/A;  . IR FLUORO GUIDE CV LINE RIGHT  07/17/2019  . IR PARACENTESIS  07/09/2019  . IR US GUIDE VASC ACCESS RIGHT  07/17/2019  . LAPAROSCOPIC CHOLECYSTECTOMY  03/09/2018  . PERITONEAL CATHETER INSERTION  2017  . POLYPECTOMY  10/24/2018   Procedure: POLYPECTOMY;  Surgeon: Daneil Dolin, MD;  [facility];  Service: Endoscopy;;  . TOTAL HIP ARTHROPLASTY Left 1997      Social History:     Social History   Tobacco Use  . Smoking status: Passive Smoke Exposure - Never Smoker  . Smokeless tobacco: Never Used  Substance Use Topics  . Alcohol use: Never        Family History :     Family History  Problem Relation Age of Onset  . Heart disease Mother   . Thrombocytopenia Mother        TTP  . Heart failure Father   . Kidney disease Paternal Grandfather   . Colon cancer Neg Hx      Home Medications:   Prior to Admission medications   Medication Sig Start Date End Date Taking? Authorizing Provider  acetaminophen (TYLENOL) 325 MG tablet Take 650 mg by mouth every 6 (six) hours as needed (pain).   Yes [provider]  aspirin EC 81 MG tablet Take 81 mg by mouth daily.   Yes [provider]  Blood Glucose Monitoring Suppl (ACCU-CHEK GUIDE) w/Device KIT 1 Piece by Does not apply route as directed. 02/28/19  Yes Nida, Marella Chimes, MD  calcitRIOL (ROCALTROL) 0.5 MCG capsule Take 0.5-1 mcg by mouth See admin instructions. 0.76mg on Mondays through Fridays and take 148m on Saturdays and Sundays   Yes [provider]  glucose blood (ACCU-CHEK GUIDE) test strip Use as instructed 02/28/19  Yes Nida, GeMarella ChimesMD  HYDROcodone-acetaminophen (NORCO) 5-325 MG tablet Take 1 tablet by mouth every 6 (six) hours as needed for moderate pain. 07/22/19  Yes EvDagoberto LigasPA-C  insulin glargine, 2 Unit Dial, (TOUJEO MAX SOLOSTAR) 300 UNIT/ML Solostar Pen Inject 10 Units into the skin daily. 07/19/19  Yes Ghimire, ShHenreitta LeberMD  labetalol (NORMODYNE) 300 MG tablet Take 300 mg by mouth 2 (two) times daily.  05/06/19  Yes [provider]  losartan (COZAAR) 100 MG tablet Take 100 mg by mouth daily.  05/06/19  Yes [provider]  multivitamin (RENA-VIT) TABS tablet Take 1 tablet by mouth at bedtime. 04/12/19  Yes Nicole Kindred A, DO  calcium acetate (PHOSLO) 667 MG capsule Take 2 capsules (1,334 mg total) by mouth 3 (three) times daily with meals. 04/11/19   Ezekiel Slocumb, DO  Ipratropium-Albuterol (COMBIVENT) 20-100 MCG/ACT AERS respimat Inhale 2 puffs into the lungs every 6 (six) hours as needed for wheezing or shortness of breath. Patient not taking: Reported on 07/08/2019 03/30/19   Oswald Hillock, MD     Allergies:    No Known Allergies   Physical Exam:   Vitals  Blood pressure 129/74, pulse 71, temperature 97.6 F (36.4 C), temperature source Oral, resp. rate 16, height 5' 8" (1.727 m), weight 127 kg, SpO2 100 %.   1. General developed female, laying in bed in no apparent distress.  2. Normal affect and insight, Not Suicidal or Homicidal, Awake Alert, Oriented X 3.  3. No F.N deficits, ALL C.Nerves Intact, Strength 5/5 all 4 extremities, Sensation intact all 4 extremities, Plantars down going.  4. Ears and Eyes appear Normal, Conjunctivae clear, PERRLA. Moist Oral Mucosa.  5. Supple Neck, No JVD, No cervical lymphadenopathy appriciated, No Carotid Bruits.+3 edema  6. Symmetrical Chest wall movement, Good air movement bilaterally, CTAB.  7. RRR, No Gallops, Rubs or Murmurs, No Parasternal Heave.  8. Positive Bowel Sounds, Abdomen Soft, No tenderness, No organomegaly appriciated,No rebound -guarding or rigidity.  9.  No Cyanosis, Normal Skin Turgor, No Skin Rash or Bruise.  10. Good muscle tone,  joints appear normal , no  effusions, Normal ROM.  11. No Palpable Lymph Nodes in Neck or Axillae     Data Review:    CBC Recent Labs  Lab 07/22/19 0818 07/26/19 1140 07/26/19 1407  WBC  --   --  9.5  HGB 9.2* 4.3* 4.7*  HCT 27.0* 14.6* 16.1*  PLT  --   --  227  MCV  --   --  81.3  MCH  --   --  23.7*  MCHC  --   --  29.2*  RDW  --   --  23.1*  LYMPHSABS  --   --  1.7  MONOABS  --   --  1.3*  EOSABS  --   --  0.5  BASOSABS  --   --  0.1   ------------------------------------------------------------------------------------------------------------------  Chemistries  Recent Labs  Lab 07/22/19 0818 07/26/19 1407  NA 136 136  K 4.6 4.3  CL 101 98  CO2  --  27  GLUCOSE 139* 187*  BUN 51* 34*  CREATININE 9.60* 5.86*  CALCIUM  --  8.5*   ------------------------------------------------------------------------------------------------------------------ estimated creatinine clearance is 16 mL/min (A) (by C-G formula based on SCr of 5.86 mg/dL (H)). ------------------------------------------------------------------------------------------------------------------ No results for input(s): TSH, T4TOTAL, T3FREE, THYROIDAB in the last 72 hours.  Invalid input(s): FREET3  Coagulation profile No results for input(s): INR, PROTIME in the last 168 hours. ------------------------------------------------------------------------------------------------------------------- No results for input(s): DDIMER in the last 72 hours. -------------------------------------------------------------------------------------------------------------------  Cardiac Enzymes No results for input(s): CKMB, TROPONINI, MYOGLOBIN in the last 168 hours.  Invalid input(s): CK ------------------------------------------------------------------------------------------------------------------    Component Value Date/Time   BNP 656.8 (H) 02/23/2018 2043      ---------------------------------------------------------------------------------------------------------------  Urinalysis    Component Value Date/Time   COLORURINE AMBER (A) 07/31/2018 1800   APPEARANCEUR TURBID (A) 07/31/2018 1800   LABSPEC >1.046 (H) 07/31/2018 1800   PHURINE 5.0 07/31/2018 1800   GLUCOSEU 150 (A)  07/31/2018 1800   HGBUR SMALL (A) 07/31/2018 1800   BILIRUBINUR NEGATIVE 07/31/2018 1800   KETONESUR NEGATIVE 07/31/2018 1800   PROTEINUR >=300 (A) 07/31/2018 1800   NITRITE NEGATIVE 07/31/2018 1800   LEUKOCYTESUR MODERATE (A) 07/31/2018 1800    ----------------------------------------------------------------------------------------------------------------   Imaging Results:    CT ABDOMEN PELVIS W CONTRAST  Result Date: 07/26/2019 CLINICAL DATA:  Anemia, dialysis EXAM: CT ABDOMEN AND PELVIS WITH CONTRAST TECHNIQUE: Multidetector CT imaging of the abdomen and pelvis was performed using the standard protocol following bolus administration of intravenous contrast. CONTRAST:  129m OMNIPAQUE IOHEXOL 300 MG/ML  SOLN COMPARISON:  07/09/2019, 05/27/2019 FINDINGS: Lower chest: There is extensive bilateral ground-glass and irregular interstitial opacity in the included lung bases. Cardiomegaly. Coronary artery calcifications. Hepatobiliary: No focal liver abnormality is seen. Status post cholecystectomy. No biliary dilatation. Pancreas: Unremarkable. No pancreatic ductal dilatation or surrounding inflammatory changes. Spleen: Normal in size without significant abnormality. Adrenals/Urinary Tract: Adrenal glands are unremarkable. Atrophic appearing kidneys. No hydronephrosis. Bladder is unremarkable. Stomach/Bowel: Stomach is within normal limits. Appendix appears normal. No evidence of bowel wall thickening, distention, or inflammatory changes. Vascular/Lymphatic: Aortic atherosclerosis. Extensive vascular calcinosis. No enlarged abdominal or pelvic lymph nodes. Reproductive: No  mass or other significant abnormality. Other: Anasarca. Interval enlargement of a large, loculated fluid collection in the ventral abdomen, measuring approximately 29.0 x 14.0 x 21.0 cm, now containing extensive internal heterogeneous attenuation. There is a tunneled Tenckhoff type peritoneal dialysis catheter within this collection. Musculoskeletal: Plate and screw fixation of the left acetabulum. IMPRESSION: 1. Interval enlargement of a large, loculated fluid collection in the ventral abdomen, measuring approximately 29.0 x 14.0 x 21.0 cm, now containing extensive internal heterogeneous attenuation. This appearance is most consistent with a pseudocyst in the setting of peritoneal dialysis and concerning for internal hemorrhage given report of anemia. 2. Extensive bilateral ground-glass and irregular interstitial opacity in the included bilateral lung bases, similar to prior examination and concerning for fibrotic interstitial lung disease although incompletely characterized. Consider dedicated ILD protocol imaging of the chest on a nonemergent basis. 3. Other chronic and incidental findings as above. Aortic Atherosclerosis (ICD10-I70.0). Electronically Signed   By: AEddie CandleM.D.   On: 07/26/2019 16:41     Assessment & Plan:    Active Problems:   [place]   Uncontrolled type 2 diabetes [place]   Class 2 severe obesity due to excess calories with serious comorbidity and body mass index (BMI) of 37.0 to 37.9 in adult ([facility]   Anemia in ESRD ([place]stage renal disease) [place]   Acute blood loss anemia   Acute blood loss anemia secondary to loculated intra-abdominal bleed. -Baseline hemoglobin 9, today at hemodialysis center it was 4.3, she is Hemoccult negative, no evidence of significant GI bleed, this is most likely due to related intra-abdominal bleed, as evident by CT abdomen and pelvis. -Vital signs are stable, will hold aspirin, she is ordered 2 units PRBC  transfusion, will monitor H&H closely and transfuse as needed. -As discussed with renal, [facility] case she is having recurrent bleed or continued to bleed, where she might need vascular surgery/general surgery/IR input.  End-stage renal disease on hemodialysis. -Peritoneal dialysis since 2017, started out patient hemodialysis last Monday([facility], today was her third session, she missed dialysis given her profound anemia, no dyspnea or no hypoxia, no uremia, so no indication for emergent dialysis, but she will receive 2 units PRBC, and she is anuric,  so likely she will need dialysis by tomorrow a.m.Marland Kitchen -Renal Consulted  Interstitial lung disease -Findings significant for interstitial lung disease, patient was diagnosed with COVID-19 of December 2020, but this interstitial lung findings were present in 2019, she never had formal evaluation by pulmonary, so we will have pulmonary  consulted when patient gets to [facility].  Hypertension -Blood pressure is acceptable, will hold medication given blood loss anemia  2 diabetes mellitus -We will keep at a lower dose Lantus, and will keep on sliding scale  GERD -Continue with PPI   DVT Prophylaxis  SCDs   AM Labs Ordered, also please review Full Orders  Family Communication: Admission, patients condition and plan of care including tests being ordered have been discussed with the patient (I have offered to update family, but patient reports she will do that herself) who indicate understanding and agree with the plan and Code Status.  Code Status Full  Likely DC to  Home  Condition GUARDED    Consults called: Renal Dr patel  Admission status: Inpatient  Time spent in minutes : 60 minutes   Phillips Climes M.D on 07/26/2019 at 5:18 PM  Between 7am to 7pm - Pager - [phone]. After 7pm go to [email or web address] - password [facility]  [facility] - Office  [phone]

## 2019-07-26 NOTE — ED Triage Notes (Signed)
Pt sent over from dialysis, send out hemoglobin came back at 4.3,cbg 251 en route. Pt was at dialysis this am when lab resulted. Pt received approximately 15 minutes of dialysis before pt transported to ED. Pt denies pain.   Pt reports woke up this am and noted "blood from vagina" pt reports had IUD placed x1 year ago.   Pt pale and states "im full of fluid."

## 2019-07-26 NOTE — ED Provider Notes (Signed)
[facility] EMERGENCY DEPARTMENT Provider Note   CSN: [id number] Arrival date & time: 07/26/19  1244     History Chief Complaint  Patient presents with  . Abnormal Lab    Janet Mitchell is a 54 y.o. female with a history as outlined below, most pertinent for a history of ESRD who had been on PD until 07/17/2019 when she was transitioned to HD secondary to PD associated peritonitis.  She went to dialysis this am at which time she was found to be profoundly anemic with a hemoglobin level of 4.3.  She has a history of needing blood transfusions in the past, most recently during her admission last week for treatment of her peritonitis.  She woke this morning with vaginal bleeding which surprised her as her LMP was about a year ago and so thought she was in menopause. She has not had to change her pad since this am however.  She does report she had her PD tube flushed at her HD treatment on Tuesday which she states was bloody and has noticed pink tinged fluid in the tubing since.  She denies unusual bleeding otherwise or excessive bruising. Specifically denies rectal bleeding, no n/v or abdominal pain. She has had no increased sob or chest pain, but does endorse being very tired this week.    The history is provided by the patient.       Past Medical History:  Diagnosis Date  . Anemia   . Arthritis   . CHF (congestive heart failure) ([place])    pt states she has been cleared of heart failure/disease  . Chronic cholecystitis with calculus   . COVID-19 virus infection 03/2019  . ESRD (end stage renal disease) on dialysis [facility])    "peritoneal dialysis q hs" (03/09/2018)  . Headache    "a few/wk" (03/09/2018) - no longer having these  . History of blood transfusion 10/2017   "low blood count" (03/09/2018)  . Hypertension   . Spinal headache   . Type II diabetes mellitus [facility])     Patient Active Problem List   Diagnosis Date Noted  . Ascites 07/09/2019  . Acute blood loss anemia     . Hyperkalemia   . Hypotension   . Vertigo   . Black stool 04/04/2019  . Uremia, acute   . Weakness   . COVID-19 03/26/2019  . Chronic diarrhea 03/25/2019  . Uncontrolled type II diabetes mellitus with chronic kidney disease ([place])   . Hypokalemia   . Hyponatremia   . Anemia in ESRD (end-stage renal disease) ([place])   . Essential hypertension, benign 02/14/2019  . Class 2 severe obesity due to excess calories with serious comorbidity and body mass index (BMI) of 37.0 to 37.9 in adult [facility]) 02/14/2019  . SBP (spontaneous bacterial peritonitis) ([place]) 12/18/2018  . Generalized abdominal pain 11/28/2018  . Rectal burning 08/27/2018  . Rectal bleeding 08/27/2018  . Diarrhea 05/22/2018  . Fluid overload 03/25/2018  . Microcytic anemia 03/25/2018  . Chronic cholecystitis with calculus 03/09/2018  . Type 2 diabetes mellitus with ESRD (end-stage renal disease) ([place]) 03/09/2018  . Pre-transplant evaluation for ESRD (end stage renal disease) 12/19/2017  . Symptomatic anemia 11/06/2017  . Essential hypertension 11/06/2017  . ESRD on peritoneal dialysis ([place]) 11/06/2017  . Diabetic macular edema ([place]) 04/10/2015  . Edema of lower extremity 04/10/2015  . Uncontrolled type 2 diabetes mellitus ([place]) 04/10/2015    Past Surgical History:  Procedure Laterality Date  . AMPUTATION TOE Left 2013  Great toe  . AV FISTULA PLACEMENT Left 07/22/2019   Procedure: LEFT ARM BASILIC ARTERIOVENOUS (AV) FISTULA CREATION;  Surgeon: Rosetta Posner, MD;  Location: [place];  Service: Vascular;  Laterality: Left;  . BIOPSY  10/24/2018   Procedure: BIOPSY;  Surgeon: Daneil Dolin, MD;  [facility];  Service: Endoscopy;;  right and left colon  . CATARACT EXTRACTION W/ INTRAOCULAR LENS IMPLANT Right   . CESAREAN SECTION  1994; 1998  . CHOLECYSTECTOMY N/A 03/09/2018   Procedure: LAPAROSCOPIC CHOLECYSTECTOMY WITH INTRAOPERATIVE CHOLANGIOGRAM ERAS PATHWAY;  Surgeon: Donnie Mesa, MD;  Location: [place];   Service: General;  Laterality: N/A;  . COLONOSCOPY N/A 10/24/2018   Procedure: COLONOSCOPY;  Surgeon: Daneil Dolin, MD;  [facility];  Service: Endoscopy;  Laterality: N/A;  1:00pm  . EYE SURGERY  05/15/2018   Removal of blood in the globe (due to DM)  . FLEXIBLE SIGMOIDOSCOPY N/A 11/24/2017   Procedure: FLEXIBLE SIGMOIDOSCOPY;  Surgeon: Daneil Dolin, MD;  [facility];  Service: Endoscopy;  Laterality: N/A;  . IR FLUORO GUIDE CV LINE RIGHT  07/17/2019  . IR PARACENTESIS  07/09/2019  . IR US GUIDE VASC ACCESS RIGHT  07/17/2019  . LAPAROSCOPIC CHOLECYSTECTOMY  03/09/2018  . PERITONEAL CATHETER INSERTION  2017  . POLYPECTOMY  10/24/2018   Procedure: POLYPECTOMY;  Surgeon: Daneil Dolin, MD;  [facility];  Service: Endoscopy;;  . TOTAL HIP ARTHROPLASTY Left 1997     OB History    Gravida  2   Para  2   Term      Preterm      AB      Living  2     SAB      TAB      Ectopic      Multiple      Live Births              Family History  Problem Relation Age of Onset  . Heart disease Mother   . Thrombocytopenia Mother        TTP  . Heart failure Father   . Kidney disease Paternal Grandfather   . Colon cancer Neg Hx     Social History   Tobacco Use  . Smoking status: Passive Smoke Exposure - Never Smoker  . Smokeless tobacco: Never Used  Substance Use Topics  . Alcohol use: Never  . Drug use: Never    Home Medications Prior to Admission medications   Medication Sig Start Date End Date Taking? Authorizing Provider  acetaminophen (TYLENOL) 325 MG tablet Take 650 mg by mouth every 6 (six) hours as needed (pain).   Yes [provider]  aspirin EC 81 MG tablet Take 81 mg by mouth daily.   Yes [provider]  Blood Glucose Monitoring Suppl (ACCU-CHEK GUIDE) w/Device KIT 1 Piece by Does not apply route as directed. 02/28/19  Yes Nida, Marella Chimes, MD  calcitRIOL (ROCALTROL) 0.5 MCG capsule Take 0.5-1 mcg  by mouth See admin instructions. 0.83mg on Mondays through Fridays and take 153m on Saturdays and Sundays   Yes [provider]  glucose blood (ACCU-CHEK GUIDE) test strip Use as instructed 02/28/19  Yes Nida, GeMarella ChimesMD  HYDROcodone-acetaminophen (NORCO) 5-325 MG tablet Take 1 tablet by mouth every 6 (six) hours as needed for moderate pain. 07/22/19  Yes EvDagoberto LigasPA-C  insulin glargine, 2 Unit Dial, (TOUJEO MAX SOLOSTAR) 300 UNIT/ML Solostar Pen Inject 10 Units  into the skin daily. 07/19/19  Yes Ghimire, Henreitta Leber, MD  labetalol (NORMODYNE) 300 MG tablet Take 300 mg by mouth 2 (two) times daily.  05/06/19  Yes [provider]  losartan (COZAAR) 100 MG tablet Take 100 mg by mouth daily.  05/06/19  Yes [provider]  multivitamin (RENA-VIT) TABS tablet Take 1 tablet by mouth at bedtime. 04/12/19  Yes Nicole Kindred A, DO  calcium acetate (PHOSLO) 667 MG capsule Take 2 capsules (1,334 mg total) by mouth 3 (three) times daily with meals. 04/11/19   Ezekiel Slocumb, DO  Ipratropium-Albuterol (COMBIVENT) 20-100 MCG/ACT AERS respimat Inhale 2 puffs into the lungs every 6 (six) hours as needed for wheezing or shortness of breath. Patient not taking: Reported on 07/08/2019 03/30/19   Oswald Hillock, MD    Allergies    Patient has no known allergies.  Review of Systems   Review of Systems  Constitutional: Positive for fatigue. Negative for chills and fever.  HENT: Negative for congestion and sore throat.   Eyes: Negative.   Respiratory: Negative for chest tightness and shortness of breath.   Cardiovascular: Negative for chest pain.  Gastrointestinal: Negative for abdominal pain, blood in stool, nausea and vomiting.  Genitourinary: Positive for vaginal bleeding.  Musculoskeletal: Negative for arthralgias, joint swelling and neck pain.  Skin: Negative.  Negative for rash and wound.  Neurological: Positive for weakness. Negative for dizziness, light-headedness,  numbness and headaches.  Psychiatric/Behavioral: Negative.     Physical Exam Updated Vital Signs BP 129/74   Pulse 71   Temp 97.6 F (36.4 C) (Oral)   Resp 16   Ht '5\' 8"'  (1.727 m)   Wt 127 kg   SpO2 100%   BMI 42.57 kg/m   Physical Exam Vitals and nursing note reviewed.  Constitutional:      Appearance: She is well-developed.  HENT:     Head: Normocephalic and atraumatic.  Eyes:     Conjunctiva/sclera: Conjunctivae normal.     Comments: Pale conjunctiva.  Cardiovascular:     Rate and Rhythm: Normal rate and regular rhythm.     Heart sounds: Normal heart sounds.     Comments: New AV fistula left arm appears healing well.   Pulmonary:     Effort: Pulmonary effort is normal.     Breath sounds: Normal breath sounds. No wheezing.  Chest:     Comments: HD catheter right chest Abdominal:     General: Bowel sounds are normal. There is distension.     Palpations: Abdomen is soft.     Tenderness: There is no abdominal tenderness. There is no guarding or rebound.     Comments: PD tubing with pink tinged fluid.  Genitourinary:    Rectum: Guaiac result negative.  Musculoskeletal:        General: Normal range of motion.     Cervical back: Normal range of motion.  Skin:    General: Skin is warm and dry.  Neurological:     Mental Status: She is alert.     ED Results / Procedures / Treatments   Labs (all labs ordered are listed, but only abnormal results are displayed) Labs Reviewed  CBC WITH DIFFERENTIAL/PLATELET - Abnormal; Notable for the following components:      Result Value   RBC 1.98 (*)    Hemoglobin 4.7 (*)    HCT 16.1 (*)    MCH 23.7 (*)    MCHC 29.2 (*)    RDW 23.1 (*)  Monocytes Absolute 1.3 (*)    All other components within normal limits  BASIC METABOLIC PANEL - Abnormal; Notable for the following components:   Glucose, Bld 187 (*)    BUN 34 (*)    Creatinine, Ser 5.86 (*)    Calcium 8.5 (*)    GFR calc non Af Amer 8 (*)    GFR calc Af Amer 9  (*)    All other components within normal limits  HCG, QUANTITATIVE, PREGNANCY  POC OCCULT BLOOD, ED  TYPE AND SCREEN  PREPARE RBC (CROSSMATCH)    EKG None  Radiology CT ABDOMEN PELVIS W CONTRAST  Result Date: 07/26/2019 CLINICAL DATA:  Anemia, dialysis EXAM: CT ABDOMEN AND PELVIS WITH CONTRAST TECHNIQUE: Multidetector CT imaging of the abdomen and pelvis was performed using the standard protocol following bolus administration of intravenous contrast. CONTRAST:  142m OMNIPAQUE IOHEXOL 300 MG/ML  SOLN COMPARISON:  07/09/2019, 05/27/2019 FINDINGS: Lower chest: There is extensive bilateral ground-glass and irregular interstitial opacity in the included lung bases. Cardiomegaly. Coronary artery calcifications. Hepatobiliary: No focal liver abnormality is seen. Status post cholecystectomy. No biliary dilatation. Pancreas: Unremarkable. No pancreatic ductal dilatation or surrounding inflammatory changes. Spleen: Normal in size without significant abnormality. Adrenals/Urinary Tract: Adrenal glands are unremarkable. Atrophic appearing kidneys. No hydronephrosis. Bladder is unremarkable. Stomach/Bowel: Stomach is within normal limits. Appendix appears normal. No evidence of bowel wall thickening, distention, or inflammatory changes. Vascular/Lymphatic: Aortic atherosclerosis. Extensive vascular calcinosis. No enlarged abdominal or pelvic lymph nodes. Reproductive: No mass or other significant abnormality. Other: Anasarca. Interval enlargement of a large, loculated fluid collection in the ventral abdomen, measuring approximately 29.0 x 14.0 x 21.0 cm, now containing extensive internal heterogeneous attenuation. There is a tunneled Tenckhoff type peritoneal dialysis catheter within this collection. Musculoskeletal: Plate and screw fixation of the left acetabulum. IMPRESSION: 1. Interval enlargement of a large, loculated fluid collection in the ventral abdomen, measuring approximately 29.0 x 14.0 x 21.0 cm, now  containing extensive internal heterogeneous attenuation. This appearance is most consistent with a pseudocyst in the setting of peritoneal dialysis and concerning for internal hemorrhage given report of anemia. 2. Extensive bilateral ground-glass and irregular interstitial opacity in the included bilateral lung bases, similar to prior examination and concerning for fibrotic interstitial lung disease although incompletely characterized. Consider dedicated ILD protocol imaging of the chest on a nonemergent basis. 3. Other chronic and incidental findings as above. Aortic Atherosclerosis (ICD10-I70.0). Electronically Signed   By: AEddie CandleM.D.   On: 07/26/2019 16:41    Procedures Procedures (including critical care time)  CRITICAL CARE Performed by: JEvalee JeffersonTotal critical care time: 45 minutes Critical care time was exclusive of separately billable procedures and treating other patients. Critical care was necessary to treat or prevent imminent or life-threatening deterioration. Critical care was time spent personally by me on the following activities: development of treatment plan with patient and/or surrogate as well as nursing, discussions with consultants, evaluation of patient's response to treatment, examination of patient, obtaining history from patient or surrogate, ordering and performing treatments and interventions, ordering and review of laboratory studies, ordering and review of radiographic studies, pulse oximetry and re-evaluation of patient's condition.   Medications Ordered in ED Medications  desmopressin (DDAVP) 38 mcg in sodium chloride 0.9 % 50 mL IVPB (38 mcg Intravenous New Bag/Given 07/26/19 1657)  0.9 %  sodium chloride infusion (10 mL/hr Intravenous New Bag/Given 07/26/19 1700)  iohexol (OMNIPAQUE) 300 MG/ML solution 100 mL (100 mLs Intravenous Contrast Given 07/26/19 1559)  ED Course  I have reviewed the triage vital signs and the nursing notes.  Pertinent labs &  imaging results that were available during my care of the patient were reviewed by me and considered in my medical decision making (see chart for details).    MDM Rules/Calculators/A&P                      Pt with severe anemia, hemoccult negative but with history of blood noted with PD flush 3 days ago.  Discussed with Dr. Waldron Labs who requested CT abd/pelvis prior to admission to assess for abdominal source of bleeding. Concern for uremic bleeding vs vascular trauma from the tubing.  Desmopressin ordered additionally per his request.  Type and crossed 2 units to start, pending match given antibodies.   CT imaging results with large amount of loculated material suspicious for internal hemorrhage.   Pt will be admitted to the hospitalist service at [facility].   Final Clinical Impression(s) / ED Diagnoses Final diagnoses:  Anemia due to acute blood loss    Rx / DC Orders ED Discharge Orders    None       Landis Martins 07/26/19 1728    Hayden Rasmussen, MD 07/26/19 [phone]

## 2019-07-26 NOTE — ED Notes (Signed)
Date and time results received: 07/26/19 1438   Test: Hemoglobin Critical Value: 4.7  Name of Provider Notified: Rhodia Albright  Orders Received? Or Actions Taken?:No new orders given.

## 2019-07-26 NOTE — Telephone Encounter (Signed)
Patient called, stated that she has an IUD and she is having a full blown cycle.  She'd like to speak with a nurse.  [employer]  [phone]

## 2019-07-26 NOTE — ED Notes (Signed)
Bruit and thrill present to left arm, stitches noted. 2 lumen Cath noted to right chest, dressing clean and dry.

## 2019-07-27 LAB — PROTIME-INR
INR: 1.3 — ABNORMAL HIGH (ref 0.8–1.2)
Prothrombin Time: 15.2 seconds (ref 11.4–15.2)

## 2019-07-27 LAB — BASIC METABOLIC PANEL WITH GFR
Anion gap: 11 (ref 5–15)
BUN: 33 mg/dL — ABNORMAL HIGH (ref 6–20)
CO2: 27 mmol/L (ref 22–32)
Calcium: 9.1 mg/dL (ref 8.9–10.3)
Chloride: 100 mmol/L (ref 98–111)
Creatinine, Ser: 6.49 mg/dL — ABNORMAL HIGH (ref 0.44–1.00)
GFR calc Af Amer: 8 mL/min — ABNORMAL LOW
GFR calc non Af Amer: 7 mL/min — ABNORMAL LOW
Glucose, Bld: 143 mg/dL — ABNORMAL HIGH (ref 70–99)
Potassium: 5.1 mmol/L (ref 3.5–5.1)
Sodium: 138 mmol/L (ref 135–145)

## 2019-07-27 LAB — GLUCOSE, CAPILLARY
Glucose-Capillary: 134 mg/dL — ABNORMAL HIGH (ref 70–99)
Glucose-Capillary: 122 mg/dL — ABNORMAL HIGH (ref 70–99)
Glucose-Capillary: 139 mg/dL — ABNORMAL HIGH (ref 70–99)

## 2019-07-27 LAB — CBC
HCT: 19.5 % — ABNORMAL LOW (ref 36.0–46.0)
HCT: 21 % — ABNORMAL LOW (ref 36.0–46.0)
Hemoglobin: 6 g/dL — CL (ref 12.0–15.0)
Hemoglobin: 6.6 g/dL — CL (ref 12.0–15.0)
MCH: 25.5 pg — ABNORMAL LOW (ref 26.0–34.0)
MCH: 25 pg — ABNORMAL LOW (ref 26.0–34.0)
MCHC: 30.8 g/dL (ref 30.0–36.0)
MCHC: 31.4 g/dL (ref 30.0–36.0)
MCV: 83 fL (ref 80.0–100.0)
MCV: 79.5 fL — ABNORMAL LOW (ref 80.0–100.0)
Platelets: 209 10*3/uL (ref 150–400)
Platelets: 203 10*3/uL (ref 150–400)
RBC: 2.35 MIL/uL — ABNORMAL LOW (ref 3.87–5.11)
RBC: 2.64 MIL/uL — ABNORMAL LOW (ref 3.87–5.11)
RDW: 22.1 % — ABNORMAL HIGH (ref 11.5–15.5)
RDW: 20.8 % — ABNORMAL HIGH (ref 11.5–15.5)
WBC: 8.2 10*3/uL (ref 4.0–10.5)
WBC: 10.4 10*3/uL (ref 4.0–10.5)
nRBC: 0.6 % — ABNORMAL HIGH (ref 0.0–0.2)
nRBC: 1 % — ABNORMAL HIGH (ref 0.0–0.2)

## 2019-07-27 LAB — MAGNESIUM: Magnesium: 1.8 mg/dL (ref 1.7–2.4)

## 2019-07-27 LAB — C-REACTIVE PROTEIN: CRP: 6.6 mg/dL — ABNORMAL HIGH

## 2019-07-27 LAB — PREPARE RBC (CROSSMATCH)

## 2019-07-27 LAB — BRAIN NATRIURETIC PEPTIDE: B Natriuretic Peptide: 1559.8 pg/mL — ABNORMAL HIGH (ref 0.0–100.0)

## 2019-07-27 MED ORDER — SODIUM CHLORIDE 0.9% IV SOLUTION: Freq: Once | INTRAVENOUS | Status: AC

## 2019-07-27 MED ORDER — INSULIN GLARGINE 100 UNIT/ML ~~LOC~~ SOLN
5.0000 [IU] | Freq: Every day | SUBCUTANEOUS | Status: DC
  Filled 2019-07-27: qty 0.05

## 2019-07-27 MED ORDER — CALCITRIOL 0.25 MCG PO CAPS
1.0000 ug | ORAL_CAPSULE | ORAL | Status: DC
  Administered 2019-07-27 – 2019-07-28 (×2): 1 ug via ORAL
  Filled 2019-07-27 (×2): qty 4

## 2019-07-27 MED ORDER — DIPHENHYDRAMINE HCL 50 MG/ML IJ SOLN
12.5000 mg | Freq: Three times a day (TID) | INTRAMUSCULAR | Status: DC | PRN
  Administered 2019-07-27 – 2019-07-30 (×8): 12.5 mg via INTRAVENOUS
  Filled 2019-07-27 (×7): qty 1

## 2019-07-27 MED ORDER — INSULIN ASPART 100 UNIT/ML ~~LOC~~ SOLN: 0.0000 [IU] | Freq: Four times a day (QID) | SUBCUTANEOUS | Status: DC

## 2019-07-27 MED ORDER — RENA-VITE PO TABS
1.0000 | ORAL_TABLET | Freq: Every day | ORAL | Status: DC
  Administered 2019-07-27 – 2019-07-29 (×3): 1 via ORAL
  Filled 2019-07-27 (×3): qty 1

## 2019-07-27 MED ORDER — ACETAMINOPHEN 325 MG PO TABS
650.0000 mg | ORAL_TABLET | Freq: Four times a day (QID) | ORAL | Status: DC | PRN
  Administered 2019-07-27 – 2019-07-29 (×3): 650 mg via ORAL
  Filled 2019-07-27 (×5): qty 2

## 2019-07-27 MED ORDER — INSULIN ASPART 100 UNIT/ML ~~LOC~~ SOLN
0.0000 [IU] | Freq: Four times a day (QID) | SUBCUTANEOUS | Status: DC
  Administered 2019-07-27 – 2019-07-30 (×3): 1 [IU] via SUBCUTANEOUS

## 2019-07-27 MED ORDER — ALBUTEROL SULFATE HFA 108 (90 BASE) MCG/ACT IN AERS
2.0000 | INHALATION_SPRAY | Freq: Four times a day (QID) | RESPIRATORY_TRACT | Status: DC | PRN
  Filled 2019-07-27: qty 6.7

## 2019-07-27 MED ORDER — CHLORHEXIDINE GLUCONATE CLOTH 2 % EX PADS
6.0000 | MEDICATED_PAD | Freq: Every day | CUTANEOUS | Status: DC
  Administered 2019-07-28 – 2019-07-29 (×2): 6 via TOPICAL

## 2019-07-27 MED ORDER — AMLODIPINE BESYLATE 10 MG PO TABS
10.0000 mg | ORAL_TABLET | Freq: Every day | ORAL | Status: DC
  Administered 2019-07-27 – 2019-07-28 (×2): 10 mg via ORAL
  Filled 2019-07-27: qty 1
  Filled 2019-07-27: qty 2

## 2019-07-27 MED ORDER — DIPHENHYDRAMINE HCL 50 MG/ML IJ SOLN
INTRAMUSCULAR | Status: AC
  Filled 2019-07-27: qty 1

## 2019-07-27 MED ORDER — HEPARIN SODIUM (PORCINE) 1000 UNIT/ML IJ SOLN
INTRAMUSCULAR | Status: AC
  Administered 2019-07-28: 3200 [IU]
  Filled 2019-07-27: qty 4

## 2019-07-27 MED ORDER — CALCITRIOL 0.25 MCG PO CAPS
0.5000 ug | ORAL_CAPSULE | ORAL | Status: DC
  Administered 2019-07-29 – 2019-07-30 (×2): 0.5 ug via ORAL

## 2019-07-27 MED ORDER — HYDRALAZINE HCL 20 MG/ML IJ SOLN
10.0000 mg | Freq: Four times a day (QID) | INTRAMUSCULAR | Status: DC | PRN
  Administered 2019-07-30: 10 mg via INTRAVENOUS
  Filled 2019-07-27: qty 1

## 2019-07-27 NOTE — Consult Note (Signed)
[facility] Surgery Consult Note  Janet Mitchell 08-03-1965  [id number].    Requesting MD: Lala Lund Chief Complaint/Reason for Consult: anemia  HPI:  Janet Mitchell is a 54yo female PMH ESRD recently switched from PD to HD 07/17/19, who was transferred from [facility] to [facility] for admission after being found to have symptomatic anemia.  She had gone to dialysis and found to have hgb 4.3. Complaining of fatigue and lightheadedness. Denies chest pain, LOC, abdominal pain, nausea, vomiting. She does report some mild abdominal distention, but no pain. Tolerating diet and having bowel function. In the ED she underwent CT scan which showed interval enlargement of a large, loculated fluid collection in the ventral abdomen, measuring approximately 29.0 x 14.0 x 21.0 cm, now containing extensive internal heterogeneous attenuation most consistent with a pseudocyst in the setting of peritoneal dialysis and concerning for internal hemorrhage given report of anemia. She just completed 2 units PRBCs. Vital signs stable without tachycardia or hypotension. General surgery asked to see. Of note, patient transitioned to HD after she developed peritonitis with Fluid Cx+MRSE.  Abdominal surgical history: cholecystectomy, PD catheter placement 2017 in [place] Anticoagulants: none Nonsmoker Lives at home with her husband Employment: not currently working  Review of Systems  Constitutional: Positive for malaise/fatigue. Negative for chills and fever.  HENT: Negative.   Eyes: Negative.   Respiratory: Negative.   Cardiovascular: Positive for leg swelling. Negative for chest pain.  Gastrointestinal: Negative for abdominal pain, blood in stool, constipation, diarrhea, nausea and vomiting.       Abdominal distension  Genitourinary: Negative.   Musculoskeletal: Negative.   Skin: Negative.   Neurological: Positive for dizziness.    All systems reviewed and otherwise negative except for as  above  Family History  Problem Relation Age of Onset  . Heart disease Mother   . Thrombocytopenia Mother        TTP  . Heart failure Father   . Kidney disease Paternal Grandfather   . Colon cancer Neg Hx     Past Medical History:  Diagnosis Date  . Anemia   . Arthritis   . CHF (congestive heart failure) ([place])    pt states she has been cleared of heart failure/disease  . Chronic cholecystitis with calculus   . COVID-19 virus infection 03/2019  . ESRD (end stage renal disease) on dialysis [facility])    "peritoneal dialysis q hs" (03/09/2018)  . Headache    "a few/wk" (03/09/2018) - no longer having these  . History of blood transfusion 10/2017   "low blood count" (03/09/2018)  . Hypertension   . Spinal headache   . Type II diabetes mellitus ([place])     Past Surgical History:  Procedure Laterality Date  . AMPUTATION TOE Left 2013   Great toe  . AV FISTULA PLACEMENT Left 07/22/2019   Procedure: LEFT ARM BASILIC ARTERIOVENOUS (AV) FISTULA CREATION;  Surgeon: Rosetta Posner, MD;  Location: [place];  Service: Vascular;  Laterality: Left;  . BIOPSY  10/24/2018   Procedure: BIOPSY;  Surgeon: Daneil Dolin, MD;  [facility];  Service: Endoscopy;;  right and left colon  . CATARACT EXTRACTION W/ INTRAOCULAR LENS IMPLANT Right   . CESAREAN SECTION  1994; 1998  . CHOLECYSTECTOMY N/A 03/09/2018   Procedure: LAPAROSCOPIC CHOLECYSTECTOMY WITH INTRAOPERATIVE CHOLANGIOGRAM ERAS PATHWAY;  Surgeon: Donnie Mesa, MD;  Location: [place];  Service: General;  Laterality: N/A;  . COLONOSCOPY N/A 10/24/2018   Procedure: COLONOSCOPY;  Surgeon: Manus Rudd  M, MD;  [facility];  Service: Endoscopy;  Laterality: N/A;  1:00pm  . EYE SURGERY  05/15/2018   Removal of blood in the globe (due to DM)  . FLEXIBLE SIGMOIDOSCOPY N/A 11/24/2017   Procedure: FLEXIBLE SIGMOIDOSCOPY;  Surgeon: Daneil Dolin, MD;  [facility];  Service: Endoscopy;  Laterality: N/A;  . IR FLUORO GUIDE  CV LINE RIGHT  07/17/2019  . IR PARACENTESIS  07/09/2019  . IR US GUIDE VASC ACCESS RIGHT  07/17/2019  . LAPAROSCOPIC CHOLECYSTECTOMY  03/09/2018  . PERITONEAL CATHETER INSERTION  2017  . POLYPECTOMY  10/24/2018   Procedure: POLYPECTOMY;  Surgeon: Daneil Dolin, MD;  [facility];  Service: Endoscopy;;  . TOTAL HIP ARTHROPLASTY Left 1997    Social History:  reports that she is a non-smoker but has been exposed to tobacco smoke. She has never used smokeless tobacco. She reports that she does not drink alcohol or use drugs.  Allergies: No Known Allergies  Medications Prior to Admission  Medication Sig Dispense Refill  . acetaminophen (TYLENOL) 325 MG tablet Take 650 mg by mouth every 6 (six) hours as needed (pain).    Marland Kitchen aspirin EC 81 MG tablet Take 81 mg by mouth daily.    . Blood Glucose Monitoring Suppl (ACCU-CHEK GUIDE) w/Device KIT 1 Piece by Does not apply route as directed. 1 kit 0  . calcitRIOL (ROCALTROL) 0.5 MCG capsule Take 0.5-1 mcg by mouth See admin instructions. 0.1mg on Mondays through Fridays and take 112m on Saturdays and Sundays    . glucose blood (ACCU-CHEK GUIDE) test strip Use as instructed 150 each 2  . HYDROcodone-acetaminophen (NORCO) 5-325 MG tablet Take 1 tablet by mouth every 6 (six) hours as needed for moderate pain. 12 tablet 0  . insulin glargine, 2 Unit Dial, (TOUJEO MAX SOLOSTAR) 300 UNIT/ML Solostar Pen Inject 10 Units into the skin daily.    . Marland Kitchenabetalol (NORMODYNE) 300 MG tablet Take 300 mg by mouth 2 (two) times daily.     . Marland Kitchenosartan (COZAAR) 100 MG tablet Take 100 mg by mouth daily.     . multivitamin (RENA-VIT) TABS tablet Take 1 tablet by mouth at bedtime. 30 tablet 0  . calcium acetate (PHOSLO) 667 MG capsule Take 2 capsules (1,334 mg total) by mouth 3 (three) times daily with meals. 90 capsule 0  . Ipratropium-Albuterol (COMBIVENT) 20-100 MCG/ACT AERS respimat Inhale 2 puffs into the lungs every 6 (six) hours as needed for wheezing or  shortness of breath. (Patient not taking: Reported on 07/08/2019) 4 g 0    Prior to Admission medications   Medication Sig Start Date End Date Taking? Authorizing Provider  acetaminophen (TYLENOL) 325 MG tablet Take 650 mg by mouth every 6 (six) hours as needed (pain).   Yes [provider]  aspirin EC 81 MG tablet Take 81 mg by mouth daily.   Yes [provider]  Blood Glucose Monitoring Suppl (ACCU-CHEK GUIDE) w/Device KIT 1 Piece by Does not apply route as directed. 02/28/19  Yes Nida, GeMarella ChimesMD  calcitRIOL (ROCALTROL) 0.5 MCG capsule Take 0.5-1 mcg by mouth See admin instructions. 0.19m46mon Mondays through Fridays and take 1mc97mn Saturdays and Sundays   Yes [provider]  glucose blood (ACCU-CHEK GUIDE) test strip Use as instructed 02/28/19  Yes Nida, GebrMarella Chimes  HYDROcodone-acetaminophen (NORCO) 5-325 MG tablet Take 1 tablet by mouth every 6 (six) hours as needed for moderate pain. 07/22/19  Yes EvelDagoberto Ligas  PA-C  insulin glargine, 2 Unit Dial, (TOUJEO MAX SOLOSTAR) 300 UNIT/ML Solostar Pen Inject 10 Units into the skin daily. 07/19/19  Yes Ghimire, Henreitta Leber, MD  labetalol (NORMODYNE) 300 MG tablet Take 300 mg by mouth 2 (two) times daily.  05/06/19  Yes [provider]  losartan (COZAAR) 100 MG tablet Take 100 mg by mouth daily.  05/06/19  Yes [provider]  multivitamin (RENA-VIT) TABS tablet Take 1 tablet by mouth at bedtime. 04/12/19  Yes Nicole Kindred A, DO  calcium acetate (PHOSLO) 667 MG capsule Take 2 capsules (1,334 mg total) by mouth 3 (three) times daily with meals. 04/11/19   Ezekiel Slocumb, DO  Ipratropium-Albuterol (COMBIVENT) 20-100 MCG/ACT AERS respimat Inhale 2 puffs into the lungs every 6 (six) hours as needed for wheezing or shortness of breath. Patient not taking: Reported on 07/08/2019 03/30/19   Oswald Hillock, MD    Blood pressure (!) 148/65, pulse 81, temperature 98.2 F (36.8 C), temperature source  Oral, resp. rate 19, height 5' 9.6" (1.768 m), weight 128.4 kg, SpO2 100 %. Physical Exam: General: pleasant, overweight black female who is laying in bed in NAD HEENT: head is normocephalic, atraumatic.  Sclera are noninjected.  PERRL.  Ears and nose without any masses or lesions.  Mouth is pink and moist. Dentition fair Heart: regular, rate, and rhythm.  Normal s1,s2. No obvious murmurs, gallops, or rubs noted.  Feet WWP bilaterally, difficult to palpate pulses due to edema. 2+ pitting edema BLE Lungs: CTAB, no wheezes, rhonchi, or rales noted.  Respiratory effort nonlabored on [place] Abd: obese, soft, mild distension, hypoactive BS, no masses, hernias, or organomegaly. Abdomen nontender MS: calves soft and nontender, no gross deformity BUE/BLE Skin: warm and dry with no masses, lesions, or rashes Psych: A&Ox4 with an appropriate affect Neuro: cranial nerves grossly intact, equal strength in BUE/BLE bilaterally, normal speech, thought process intact  Results for orders placed or performed during the hospital encounter of 07/26/19 (from the past 48 hour(s))  CBC with Differential     Status: Abnormal   Collection Time: 07/26/19  2:07 PM  Result Value Ref Range   WBC 9.5 4.0 - 10.5 K/uL   RBC 1.98 (L) 3.87 - 5.11 MIL/uL   Hemoglobin 4.7 (LL) 12.0 - 15.0 g/dL    Comment: REPEATED TO VERIFY THIS CRITICAL RESULT HAS VERIFIED AND BEEN CALLED TO KENDRICK J BY LATISHA HENDERSON ON 04 30 2021 AT 1436, AND HAS BEEN READ BACK.     HCT 16.1 (L) 36.0 - 46.0 %   MCV 81.3 80.0 - 100.0 fL   MCH 23.7 (L) 26.0 - 34.0 pg   MCHC 29.2 (L) 30.0 - 36.0 g/dL   RDW 23.1 (H) 11.5 - 15.5 %   Platelets 227 150 - 400 K/uL   nRBC 0.0 0.0 - 0.2 %   Neutrophils Relative % 60 %   Neutro Abs 5.8 1.7 - 7.7 K/uL   Lymphocytes Relative 18 %   Lymphs Abs 1.7 0.7 - 4.0 K/uL   Monocytes Relative 14 %   Monocytes Absolute 1.3 (H) 0.1 - 1.0 K/uL   Eosinophils Relative 6 %   Eosinophils Absolute 0.5 0.0 - 0.5 K/uL    Basophils Relative 1 %   Basophils Absolute 0.1 0.0 - 0.1 K/uL   Immature Granulocytes 1 %   Abs Immature Granulocytes 0.07 0.00 - 0.07 K/uL   Spherocytes PRESENT     Comment: Performed at [facility], [address] [place], [place]  [address]  Basic metabolic panel     Status: Abnormal   Collection Time: 07/26/19  2:07 PM  Result Value Ref Range   Sodium 136 135 - 145 mmol/L   Potassium 4.3 3.5 - 5.1 mmol/L   Chloride 98 98 - 111 mmol/L   CO2 27 22 - 32 mmol/L   Glucose, Bld 187 (H) 70 - 99 mg/dL    Comment: Glucose reference range applies only to samples taken after fasting for at least 8 hours.   BUN 34 (H) 6 - 20 mg/dL   Creatinine, Ser 5.86 (H) 0.44 - 1.00 mg/dL   Calcium 8.5 (L) 8.9 - 10.3 mg/dL   GFR calc non Af Amer 8 (L) >60 mL/min   GFR calc Af Amer 9 (L) >60 mL/min   Anion gap 11 5 - 15    Comment: Performed at [facility], [address], [place], [place] [address]  hCG, quantitative, pregnancy     Status: None   Collection Time: 07/26/19  2:07 PM  Result Value Ref Range   hCG, Beta Chain, Quant, S 2 <5 mIU/mL    Comment:          GEST. AGE      CONC.  (mIU/mL)   <=1 WEEK        5 - 50     2 WEEKS       50 - 500     3 WEEKS       100 - 10,000     4 WEEKS     1,000 - 30,000     5 WEEKS     3,500 - 115,000   6-8 WEEKS     12,000 - 270,000    12 WEEKS     15,000 - 220,000        FEMALE AND NON-PREGNANT FEMALE:     LESS THAN 5 mIU/mL Performed at [facility], [address] [place], [place] [address]   Type and screen     Status: None   Collection Time: 07/26/19  2:23 PM  Result Value Ref Range   ABO/RH(D) A POS    Antibody Screen NEG    Sample Expiration      07/29/2019,2359 Performed at [facility], [address], [place], [place] [address]    Unit Number [id number]    Blood Component Type RED CELLS,LR    Unit division 00    Status of Unit REL FROM [facility]    Donor AG Type NEGATIVE FOR KELL ANTIGEN    Transfusion Status OK TO TRANSFUSE    Crossmatch  Result COMPATIBLE    Unit Number [id number]    Blood Component Type RED CELLS,LR    Unit division 00    Status of Unit REL FROM [facility]    Donor AG Type NEGATIVE FOR KELL ANTIGEN    Transfusion Status OK TO TRANSFUSE    Crossmatch Result COMPATIBLE   Prepare RBC (crossmatch)     Status: None   Collection Time: 07/26/19  3:13 PM  Result Value Ref Range   Order Confirmation      ORDER PROCESSED BY BLOOD BANK Performed at [facility], [address] [place],  [address]   POC occult blood, ED RN will collect     Status: None   Collection Time: 07/26/19  3:30 PM  Result Value Ref Range   Fecal Occult Bld NEGATIVE NEGATIVE  Respiratory Panel by RT PCR (Flu A&B, Covid) - Nasopharyngeal Swab  Status: None   Collection Time: 07/26/19  7:00 PM   Specimen: Nasopharyngeal Swab  Result Value Ref Range   SARS Coronavirus 2 by RT PCR NEGATIVE NEGATIVE    Comment: (NOTE) SARS-CoV-2 target nucleic acids are NOT DETECTED. The SARS-CoV-2 RNA is generally detectable in upper respiratoy specimens during the acute phase of infection. The lowest concentration of SARS-CoV-2 viral copies this assay can detect is 131 copies/mL. A negative result does not preclude SARS-Cov-2 infection and should not be used as the sole basis for treatment or other patient management decisions. A negative result may occur with  improper specimen collection/handling, submission of specimen other than nasopharyngeal swab, presence of viral mutation(s) within the areas targeted by this assay, and inadequate number of viral copies (<131 copies/mL). A negative result must be combined with clinical observations, patient history, and epidemiological information. The expected result is Negative. Fact Sheet for Patients:  PinkCheek.be Fact Sheet for Healthcare Providers:  GravelBags.it This test is not yet ap proved or cleared by the [place] FDA and   has been authorized for detection and/or diagnosis of SARS-CoV-2 by FDA under an Emergency Use Authorization (EUA). This EUA will remain  in effect (meaning this test can be used) for the duration of the COVID-19 declaration under Section 564(b)(1) of the Act, 21 U.S.C. section 360bbb-3(b)(1), unless the authorization is terminated or revoked sooner.    Influenza A by PCR NEGATIVE NEGATIVE   Influenza B by PCR NEGATIVE NEGATIVE    Comment: (NOTE) The Xpert Xpress SARS-CoV-2/FLU/RSV assay is intended as an aid in  the diagnosis of influenza from Nasopharyngeal swab specimens and  should not be used as a sole basis for treatment. Nasal washings and  aspirates are unacceptable for Xpert Xpress SARS-CoV-2/FLU/RSV  testing. Fact Sheet for Patients: PinkCheek.be Fact Sheet for Healthcare Providers: GravelBags.it This test is not yet approved or cleared by the [place] FDA and  has been authorized for detection and/or diagnosis of SARS-CoV-2 by  FDA under an Emergency Use Authorization (EUA). This EUA will remain  in effect (meaning this test can be used) for the duration of the  Covid-19 declaration under Section 564(b)(1) of the Act, 21  U.S.C. section 360bbb-3(b)(1), unless the authorization is  terminated or revoked. Performed at [facility], [address], [place], [place] [address]   Type and screen [place]     Status: None (Preliminary result)   Collection Time: 07/27/19 12:13 AM  Result Value Ref Range   ABO/RH(D) A POS    Antibody Screen NEG    Sample Expiration 07/30/2019,2359    Unit Number [id number]    Blood Component Type RED CELLS,LR    Unit division 00    Status of Unit ISSUED    Transfusion Status OK TO TRANSFUSE    Crossmatch Result COMPATIBLE    Donor AG Type NEGATIVE FOR KELL ANTIGEN    Unit Number [id number]    Blood Component Type RED CELLS,LR    Unit division 00     Status of Unit ISSUED    Transfusion Status OK TO TRANSFUSE    Crossmatch Result COMPATIBLE    Donor AG Type NEGATIVE FOR KELL ANTIGEN   Glucose, capillary     Status: Abnormal   Collection Time: 07/27/19  8:01 AM  Result Value Ref Range   Glucose-Capillary 134 (H) 70 - 99 mg/dL    Comment: Glucose reference range applies only to samples taken after fasting for at least 8 hours.  CT ABDOMEN PELVIS W CONTRAST  Result Date: 07/26/2019 CLINICAL DATA:  Anemia, dialysis EXAM: CT ABDOMEN AND PELVIS WITH CONTRAST TECHNIQUE: Multidetector CT imaging of the abdomen and pelvis was performed using the standard protocol following bolus administration of intravenous contrast. CONTRAST:  11m OMNIPAQUE IOHEXOL 300 MG/ML  SOLN COMPARISON:  07/09/2019, 05/27/2019 FINDINGS: Lower chest: There is extensive bilateral ground-glass and irregular interstitial opacity in the included lung bases. Cardiomegaly. Coronary artery calcifications. Hepatobiliary: No focal liver abnormality is seen. Status post cholecystectomy. No biliary dilatation. Pancreas: Unremarkable. No pancreatic ductal dilatation or surrounding inflammatory changes. Spleen: Normal in size without significant abnormality. Adrenals/Urinary Tract: Adrenal glands are unremarkable. Atrophic appearing kidneys. No hydronephrosis. Bladder is unremarkable. Stomach/Bowel: Stomach is within normal limits. Appendix appears normal. No evidence of bowel wall thickening, distention, or inflammatory changes. Vascular/Lymphatic: Aortic atherosclerosis. Extensive vascular calcinosis. No enlarged abdominal or pelvic lymph nodes. Reproductive: No mass or other significant abnormality. Other: Anasarca. Interval enlargement of a large, loculated fluid collection in the ventral abdomen, measuring approximately 29.0 x 14.0 x 21.0 cm, now containing extensive internal heterogeneous attenuation. There is a tunneled Tenckhoff type peritoneal dialysis catheter within this collection.  Musculoskeletal: Plate and screw fixation of the left acetabulum. IMPRESSION: 1. Interval enlargement of a large, loculated fluid collection in the ventral abdomen, measuring approximately 29.0 x 14.0 x 21.0 cm, now containing extensive internal heterogeneous attenuation. This appearance is most consistent with a pseudocyst in the setting of peritoneal dialysis and concerning for internal hemorrhage given report of anemia. 2. Extensive bilateral ground-glass and irregular interstitial opacity in the included bilateral lung bases, similar to prior examination and concerning for fibrotic interstitial lung disease although incompletely characterized. Consider dedicated ILD protocol imaging of the chest on a nonemergent basis. 3. Other chronic and incidental findings as above. Aortic Atherosclerosis (ICD10-I70.0). Electronically Signed   By: AEddie CandleM.D.   On: 07/26/2019 16:41   Anti-infectives (From admission, onward)   None        Assessment/Plan HTN HLD IDDM ESRD on HD from PD since 07/17/19 Obesity Interstitial lung disease, recent covid 02/2019 GERD  Intraabdominal fluid collection concerning for pseudocyst with internal hemorrhage Acute blood loss anemia - Patient just completed 2 units PRBCs, check post-transfusion CBC. She is hemodynamically stable without tachycardia or hypotension, and she has no peritonitis on abdominal exam. Continue blood transfusions PRN per primary. No role for acute surgical intervention at this time. Recommend keeping her NPO for now and await follow up labwork. Will continue to follow.  ID - none VTE - SCDs only FEN - IVF, NPO Foley - none Follow up - TBD  BWellington Hampshire PJohn [facility] Inc[facility] 07/27/2019, 8:03 AM Please see Amion for pager number during day hours 7:00am-4:30pm

## 2019-07-27 NOTE — Progress Notes (Signed)
CRITICAL VALUE ALERT  Critical Value:  Hgb 6  Date & Time Notied:  07/27/2019 0921  Provider Notified: Dr. Candiss Norse Notified   Orders Received/Actions taken: Verbal order from Dr. Candiss Norse  to transfuse 1 Unit of PRBC and Primary nurse Shinita notfied

## 2019-07-27 NOTE — Consult Note (Signed)
[facility] Renal Consultation Note    Indication for Consultation:  Management of ESRD/hemodialysis, anemia, hypertension/volume, and secondary hyperparathyroidism.  HPI: Janet Mitchell is a 54 y.o. female with  PMH including ESRD on HD at [facility], CKD 2/2 DM, HTN, and CHF, who was recently discharged from [facility] on 07/19/19 after hospitalization for anemia. During that hospitalization, she was found to have a Hgb 6.5, PD catheter associated peritonitis, and anasarca. She was treated with IV antibiotics and transitioned to HD on 4/21 due to inadequate UF with PD due to suspected membrane failure. Patient reports she has had 4 HD treatments so far. She presented to HD on 4/30 for dialysis but treatment was terminated due to a hemoglobin of  4.3. Patient reports she had some vaginal bleeding but otherwise no blood loss noted. CT abdomen showed large amount of loculated material consistent for pseudocyst but suspicious for internal hemorrhage around the PD catheter side. Patient received 2 units PRBC on 4/30, Hgb is 6.0 and another unit PRBC has been ordered. VSS with BP 160/69.  Surgery evaluated the patient and recommended no surgical intervention at this time. We have been consulted to manage ESRD while patient is admitted.  Patient reports she continues to feel volume overloaded and notices swelling in her legs and flanks, unchanged since last admission. Denies SOB, cough, CP and dizziness but does report persistent orthopnea. She denies abdominal pain, N/V/D. Denies any issues with hemodialysis so far including catheter pain/bleeding, hypotension and cramping. She believes EDW is 121kg and has been tolerating 3-4L UF with HD.  Past Medical History:  Diagnosis Date  . Anemia   . Arthritis   . CHF (congestive heart failure) ([place])    pt states she has been cleared of heart failure/disease  . Chronic cholecystitis with calculus   . COVID-19 virus infection 03/2019  . ESRD  (end stage renal disease) on dialysis [facility])    "peritoneal dialysis q hs" (03/09/2018)  . Headache    "a few/wk" (03/09/2018) - no longer having these  . History of blood transfusion 10/2017   "low blood count" (03/09/2018)  . Hypertension   . Spinal headache   . Type II diabetes mellitus ([place])    Past Surgical History:  Procedure Laterality Date  . AMPUTATION TOE Left 2013   Great toe  . AV FISTULA PLACEMENT Left 07/22/2019   Procedure: LEFT ARM BASILIC ARTERIOVENOUS (AV) FISTULA CREATION;  Surgeon: Rosetta Posner, MD;  Location: [place];  Service: Vascular;  Laterality: Left;  . BIOPSY  10/24/2018   Procedure: BIOPSY;  Surgeon: Daneil Dolin, MD;  [facility];  Service: Endoscopy;;  right and left colon  . CATARACT EXTRACTION W/ INTRAOCULAR LENS IMPLANT Right   . CESAREAN SECTION  1994; 1998  . CHOLECYSTECTOMY N/A 03/09/2018   Procedure: LAPAROSCOPIC CHOLECYSTECTOMY WITH INTRAOPERATIVE CHOLANGIOGRAM ERAS PATHWAY;  Surgeon: Donnie Mesa, MD;  Location: [place];  Service: General;  Laterality: N/A;  . COLONOSCOPY N/A 10/24/2018   Procedure: COLONOSCOPY;  Surgeon: Daneil Dolin, MD;  [facility];  Service: Endoscopy;  Laterality: N/A;  1:00pm  . EYE SURGERY  05/15/2018   Removal of blood in the globe (due to DM)  . FLEXIBLE SIGMOIDOSCOPY N/A 11/24/2017   Procedure: FLEXIBLE SIGMOIDOSCOPY;  Surgeon: Daneil Dolin, MD;  [facility];  Service: Endoscopy;  Laterality: N/A;  . IR FLUORO GUIDE CV LINE RIGHT  07/17/2019  . IR PARACENTESIS  07/09/2019  . IR US  GUIDE VASC ACCESS RIGHT  07/17/2019  . LAPAROSCOPIC CHOLECYSTECTOMY  03/09/2018  . PERITONEAL CATHETER INSERTION  2017  . POLYPECTOMY  10/24/2018   Procedure: POLYPECTOMY;  Surgeon: Daneil Dolin, MD;  [facility];  Service: Endoscopy;;  . TOTAL HIP ARTHROPLASTY Left 1997   Family History  Problem Relation Age of Onset  . Heart disease Mother   . Thrombocytopenia Mother        TTP  .  Heart failure Father   . Kidney disease Paternal Grandfather   . Colon cancer Neg Hx    Social History:  reports that she is a non-smoker but has been exposed to tobacco smoke. She has never used smokeless tobacco. She reports that she does not drink alcohol or use drugs.  ROS: As per HPI otherwise negative.   Physical Exam: Vitals:   07/27/19 0509 07/27/19 0513 07/27/19 0715 07/27/19 0800  BP: (!) 153/68 (!) 153/69 (!) 148/65 (!) 160/69  Pulse: 80 80 81 81  Resp: '19 18 19 ' (!) 22  Temp: 98.1 F (36.7 C) 98.6 F (37 C) 98 F (36.7 C) 98.2 F (36.8 C)  TempSrc: Oral Oral Tympanic Oral  SpO2: 100% 100% 100% 100%  Weight:      Height:         General: Well developed, obese appearing female, alert and in no acute distress. Head: Normocephalic, atraumatic, sclera non-icteric, mucus membranes are moist. Neck: Supple without lymphadenopathy/masses. JVD not elevated. Lungs: Breathing is unlabored on RA. Decreased breath sounds RLL. No wheezing, rales or rhonchi appreciated. Heart: RRR with normal S1, S2. No murmurs, rubs, or gallops appreciated. Abdomen: Soft, non-tender, +BS. + anasarca with pitting edema b/l flanks. PD catheter in place Musculoskeletal:  Strength and tone appear normal for age. Lower extremities: 2-3+ pitting edema bilateral lower extremities Neuro: Alert and oriented X 3. Moves all extremities spontaneously. Psych:  Responds to questions appropriately with a normal affect. Dialysis Access: R IJ TDC, maturing AVF LUE  No Known Allergies Prior to Admission medications   Medication Sig Start Date End Date Taking? Authorizing Provider  acetaminophen (TYLENOL) 325 MG tablet Take 650 mg by mouth every 6 (six) hours as needed (pain).   Yes [provider]  aspirin EC 81 MG tablet Take 81 mg by mouth daily.   Yes [provider]  Blood Glucose Monitoring Suppl (ACCU-CHEK GUIDE) w/Device KIT 1 Piece by Does not apply route as directed. 02/28/19  Yes Nida,  Marella Chimes, MD  calcitRIOL (ROCALTROL) 0.5 MCG capsule Take 0.5-1 mcg by mouth See admin instructions. 0.84mg on Mondays through Fridays and take 173m on Saturdays and Sundays   Yes [provider]  glucose blood (ACCU-CHEK GUIDE) test strip Use as instructed 02/28/19  Yes Nida, GeMarella ChimesMD  HYDROcodone-acetaminophen (NORCO) 5-325 MG tablet Take 1 tablet by mouth every 6 (six) hours as needed for moderate pain. 07/22/19  Yes EvDagoberto LigasPA-C  insulin glargine, 2 Unit Dial, (TOUJEO MAX SOLOSTAR) 300 UNIT/ML Solostar Pen Inject 10 Units into the skin daily. 07/19/19  Yes Ghimire, ShHenreitta LeberMD  labetalol (NORMODYNE) 300 MG tablet Take 300 mg by mouth 2 (two) times daily.  05/06/19  Yes [provider]  losartan (COZAAR) 100 MG tablet Take 100 mg by mouth daily.  05/06/19  Yes [provider]  multivitamin (RENA-VIT) TABS tablet Take 1 tablet by mouth at bedtime. 04/12/19  Yes GrNicole Kindred, DO  calcium acetate (PHOSLO) 667 MG capsule Take 2 capsules (1,334  mg total) by mouth 3 (three) times daily with meals. 04/11/19   Ezekiel Slocumb, DO  Ipratropium-Albuterol (COMBIVENT) 20-100 MCG/ACT AERS respimat Inhale 2 puffs into the lungs every 6 (six) hours as needed for wheezing or shortness of breath. Patient not taking: Reported on 07/08/2019 03/30/19   Oswald Hillock, MD   Current Facility-Administered Medications  Medication Dose Route Frequency Provider Last Rate Last Admin  . 0.9 %  sodium chloride infusion (Manually program via Guardrails IV Fluids)   Intravenous Once Thurnell Lose, MD      . acetaminophen (TYLENOL) tablet 650 mg  650 mg Oral Q6H PRN Elgergawy, Silver Huguenin, MD      . albuterol (VENTOLIN HFA) 108 (90 Base) MCG/ACT inhaler 2 puff  2 puff Inhalation Q6H PRN Thurnell Lose, MD      . amLODipine (NORVASC) tablet 10 mg  10 mg Oral Daily Thurnell Lose, MD      . Derrill Memo ON 07/29/2019] calcitRIOL (ROCALTROL) capsule 0.5 mcg  0.5 mcg Oral Once per  day on Mon Tue Wed Thu Fri Elgergawy, Silver Huguenin, MD      . calcitRIOL (ROCALTROL) capsule 1 mcg  1 mcg Oral Once per day on Sun Sat Elgergawy, Silver Huguenin, MD      . Chlorhexidine Gluconate Cloth 2 % PADS 6 each  6 each Topical Daily Elgergawy, Silver Huguenin, MD      . diphenhydrAMINE (BENADRYL) injection 12.5 mg  12.5 mg Intravenous Q8H PRN Elgergawy, Silver Huguenin, MD   12.5 mg at 07/27/19 0213  . hydrALAZINE (APRESOLINE) injection 10 mg  10 mg Intravenous Q6H PRN Thurnell Lose, MD      . insulin aspart (novoLOG) injection 0-9 Units  0-9 Units Subcutaneous Q6H Thurnell Lose, MD      . multivitamin (RENA-VIT) tablet 1 tablet  1 tablet Oral QHS Elgergawy, Silver Huguenin, MD   1 tablet at 07/27/19 0015   Labs: Basic Metabolic Panel: Recent Labs  Lab 07/22/19 0818 07/26/19 1407 07/27/19 0755  NA 136 136 138  K 4.6 4.3 5.1  CL 101 98 100  CO2  --  27 27  GLUCOSE 139* 187* 143*  BUN 51* 34* 33*  CREATININE 9.60* 5.86* 6.49*  CALCIUM  --  8.5* 9.1   CBC: Recent Labs  Lab 07/26/19 1140 07/26/19 1407 07/27/19 0755  WBC  --  9.5 8.2  NEUTROABS  --  5.8  --   HGB 4.3* 4.7* 6.0*  HCT 14.6* 16.1* 19.5*  MCV  --  81.3 83.0  PLT  --  227 209   CBG: Recent Labs  Lab 07/22/19 1050 07/27/19 0801  GLUCAP 140* 134*   Studies/Results: CT ABDOMEN PELVIS W CONTRAST  Result Date: 07/26/2019 CLINICAL DATA:  Anemia, dialysis EXAM: CT ABDOMEN AND PELVIS WITH CONTRAST TECHNIQUE: Multidetector CT imaging of the abdomen and pelvis was performed using the standard protocol following bolus administration of intravenous contrast. CONTRAST:  153m OMNIPAQUE IOHEXOL 300 MG/ML  SOLN COMPARISON:  07/09/2019, 05/27/2019 FINDINGS: Lower chest: There is extensive bilateral ground-glass and irregular interstitial opacity in the included lung bases. Cardiomegaly. Coronary artery calcifications. Hepatobiliary: No focal liver abnormality is seen. Status post cholecystectomy. No biliary dilatation. Pancreas: Unremarkable.  No pancreatic ductal dilatation or surrounding inflammatory changes. Spleen: Normal in size without significant abnormality. Adrenals/Urinary Tract: Adrenal glands are unremarkable. Atrophic appearing kidneys. No hydronephrosis. Bladder is unremarkable. Stomach/Bowel: Stomach is within normal limits. Appendix appears normal. No evidence of bowel wall thickening, distention,  or inflammatory changes. Vascular/Lymphatic: Aortic atherosclerosis. Extensive vascular calcinosis. No enlarged abdominal or pelvic lymph nodes. Reproductive: No mass or other significant abnormality. Other: Anasarca. Interval enlargement of a large, loculated fluid collection in the ventral abdomen, measuring approximately 29.0 x 14.0 x 21.0 cm, now containing extensive internal heterogeneous attenuation. There is a tunneled Tenckhoff type peritoneal dialysis catheter within this collection. Musculoskeletal: Plate and screw fixation of the left acetabulum. IMPRESSION: 1. Interval enlargement of a large, loculated fluid collection in the ventral abdomen, measuring approximately 29.0 x 14.0 x 21.0 cm, now containing extensive internal heterogeneous attenuation. This appearance is most consistent with a pseudocyst in the setting of peritoneal dialysis and concerning for internal hemorrhage given report of anemia. 2. Extensive bilateral ground-glass and irregular interstitial opacity in the included bilateral lung bases, similar to prior examination and concerning for fibrotic interstitial lung disease although incompletely characterized. Consider dedicated ILD protocol imaging of the chest on a nonemergent basis. 3. Other chronic and incidental findings as above. Aortic Atherosclerosis (ICD10-I70.0). Electronically Signed   By: Eddie Candle M.D.   On: 07/26/2019 16:41    Dialysis Orders: Center: [facility] on MWF. EDW ?121 kg. TDC. Requesting outpatient records.   Assessment/Plan: 1.  Acute blood loss anemia/chronic anemia due to  ESRD: Hgb 4.7 on admission. S/p 2 units PRBC > Hgb 6.0, another unit ordered. CT abdomen showed intra-abdominal pseudocyst suspicious for bleeding around PD catheter site. Surgery not planning any surgical intervention at this time. Patient received a full course of IV iron during recent admission and aranesp 200 mcg SQ on 4/13. Will request records to see when ESA was last dosed outpatient.  2.  ESRD:  New start to HD on 4/21 s/p membrane failure with PD. Has new left first stage brachiobasilic fistula which was created on 4/26. Will continue HD here and continue volume removal as tolerated. No heparin with HD.  3.  Hypertension/volume: BP moderately elevated and patient remains significantly volume overloaded. Has been tolerating UF 3-4L with HD per patient, will attempt 3-3.5L today as tolerated. 1.5L fluid restriction. On norvasc.  4.  Metabolic bone disease: Calcium 9.1, no phos reported yet but slightly high during last admission. Continue calcitriol Currently NPO, resume phoslo once eating.  5.  Nutrition:  Currently NPO 6. T2DM: insulin per primary.   Anice Paganini, PA-C 07/27/2019, 10:50 AM  [employer] Pager: [phone]

## 2019-07-27 NOTE — Plan of Care (Signed)
  Problem: Clinical Measurements: Goal: Cardiovascular complication will be avoided 07/27/2019 2009 by Irish Lack, RN Outcome: Progressing 07/27/2019 2008 by Irish Lack, RN Outcome: Progressing   Problem: Clinical Measurements: Goal: Ability to maintain clinical measurements within normal limits will improve 07/27/2019 2009 by Irish Lack, RN Outcome: Not Progressing 07/27/2019 2008 by Irish Lack, RN Outcome: Progressing

## 2019-07-27 NOTE — Progress Notes (Signed)
PROGRESS NOTE                                                                                                                                                                                                             Patient Demographics:    Janet Mitchell, is a 54 y.o. female, DOB - Apr 24, 1965, DXA:[id number]  Admit date - 07/26/2019   Admitting Physician Albertine Patricia, MD  Outpatient Primary MD for the patient is Lucianne Lei, MD  LOS - 1  Chief Complaint  Patient presents with  . Abnormal Lab       Brief Narrative - Janet Mitchell  is a 54 y.o. female, with past medical history of end-stage renal disease on peritoneal hemodialysis since 2017, recently transition to hemodialysis 4/21, hypertension, diabetes mellitus type 2 , patient with rehospitalization at [facility] secondary to peritoneal dialysis catheter peritonitis, which was treated, then she was started on HD first session as an outpatient with this Monday, she presented to dialysis, her repeat CBC came back significant for drop in hemoglobin to 4.3, work-up at [facility], ER suggested that she had intra-abdominal bleed around the peritoneal dialysis catheter.  Was transferred to [facility] where general surgery and nephrology were called, she is receiving blood transfusions and being managed here.   Subjective:    Janet Mitchell today has, No headache, No chest pain, No abdominal pain - No Nausea, No new weakness tingling or numbness, No Cough - SOB.    Assessment  & Plan :     1.  Acute blood loss related acute on chronic anemia.  Patient appears to have intra-abdominal cyst with bleeding around the peritoneal catheter site, general surgery nephrology following.  3 units of packed RBC on 07/27/2019 and monitor.  2.  ESRD, now on HD.  Initially was on peritoneal diagnosis which was complicated by peritonitis and now seems to have blood around the PD catheter.  Has a tunneled catheter in  the right IJ, nephrology following, continue HD per dialysis protocol.  3.Questionable changes on CT scan suggestive of possible undiagnosed ILD.  Outpatient follow-up with pulmonary once discharged.  4.  Morbid obesity BMI 41 follow with PCP for weight loss.  5.  GERD.  Continue PPI.  6.  Hypertension.  Placed on Norvasc along with as needed IV hydralazine and monitor.  7.  HX of chronic diarrhea and migraine headaches.  Supportive care.    8. DM2 - ISS  Lab Results  Component Value Date   HGBA1C 7.2 (H) 07/08/2019   CBG (last 3)  Recent Labs    07/27/19 0801  GLUCAP 134*    Family Communication  : Husband Barnett Abu [phone] on 07/27/2019  Code Status : Full  Disposition Plan  : Stay inpatient  Status is: Inpatient  Remains inpatient appropriate because:Hemodynamically unstable   Dispo: The patient is from: Home              Anticipated d/c is to: Home              Anticipated d/c date is: > 3 days              Patient currently is not medically stable to d/c.  Consults  : General surgery, renal  Procedures  :    CT -  1. Interval enlargement of a large, loculated fluid collection in the ventral abdomen, measuring approximately 29.0 x 14.0 x 21.0 cm, now containing extensive internal heterogeneous attenuation. This appearance is most consistent with a pseudocyst in the setting of peritoneal dialysis and concerning for internal hemorrhage given report of anemia. 2. Extensive bilateral ground-glass and irregular interstitial opacity in the included bilateral lung bases, similar to prior examination and concerning for fibrotic interstitial lung disease although incompletely characterized. Consider dedicated ILD protocol imaging of the chest on a nonemergent basis. 3. Other chronic and incidental findings as above. Aortic Atherosclerosis (ICD10-I70.0).   DVT Prophylaxis  :   SCDs    Lab Results  Component Value Date   PLT 209 07/27/2019    Diet :  Diet Order             Diet NPO time specified  Diet effective now               Inpatient Medications Scheduled Meds: . sodium chloride   Intravenous Once  . [START ON 07/29/2019] calcitRIOL  0.5 mcg Oral Once per day on Mon Tue Wed Thu Fri  . calcitRIOL  1 mcg Oral Once per day on Sun Sat  . Chlorhexidine Gluconate Cloth  6 each Topical Daily  . insulin glargine  5 Units Subcutaneous Daily  . multivitamin  1 tablet Oral QHS   Continuous Infusions: PRN Meds:.acetaminophen, albuterol, diphenhydrAMINE  Antibiotics  :   Anti-infectives (From admission, onward)   None          Objective:   Vitals:   07/27/19 0509 07/27/19 0513 07/27/19 0715 07/27/19 0800  BP: (!) 153/68 (!) 153/69 (!) 148/65 (!) 160/69  Pulse: 80 80 81 81  Resp: 19 18 19  (!) 22  Temp: 98.1 F (36.7 C) 98.6 F (37 C) 98 F (36.7 C) 98.2 F (36.8 C)  TempSrc: Oral Oral Tympanic Oral  SpO2: 100% 100% 100% 100%  Weight:      Height:        SpO2: 100 %  Wt Readings from Last 3 Encounters:  07/26/19 128.4 kg  07/22/19 125.4 kg  07/19/19 125.4 kg     Intake/Output Summary (Last 24 hours) at 07/27/2019 0958 Last data filed at 07/27/2019 0710 Gross per 24 hour  Intake 644 ml  Output --  Net 644 ml     Physical Exam  Awake Alert, No new F.N deficits, Normal affect [place].AT,PERRAL Supple Neck,No JVD, No cervical lymphadenopathy appriciated.  Symmetrical Chest wall movement, Good air movement bilaterally, CTAB RRR,No Gallops,Rubs or new Murmurs, No Parasternal Heave +ve B.Sounds, Abd Soft, No tenderness, No organomegaly appriciated, No rebound -  guarding or rigidity.  PD catheter in place with some blood in the tubing. No Cyanosis, Clubbing or edema,     Data Review:    Recent Labs  Lab 07/22/19 0818 07/26/19 1140 07/26/19 1407 07/27/19 0755  WBC  --   --  9.5 8.2  HGB 9.2* 4.3* 4.7* 6.0*  HCT 27.0* 14.6* 16.1* 19.5*  PLT  --   --  227 209  MCV  --   --  81.3 83.0  MCH  --   --  23.7* 25.5*  MCHC   --   --  29.2* 30.8  RDW  --   --  23.1* 22.1*  LYMPHSABS  --   --  1.7  --   MONOABS  --   --  1.3*  --   EOSABS  --   --  0.5  --   BASOSABS  --   --  0.1  --     Recent Labs  Lab 07/22/19 0818 07/26/19 1407 07/27/19 0755  NA 136 136 138  K 4.6 4.3 5.1  CL 101 98 100  CO2  --  27 27  GLUCOSE 139* 187* 143*  BUN 51* 34* 33*  CREATININE 9.60* 5.86* 6.49*  CALCIUM  --  8.5* 9.1  MG  --   --  1.8  CRP  --   --  6.6*  INR  --   --  1.3*  BNP  --   --  1,559.8*    Recent Labs  Lab 07/20/19 1131 07/26/19 1900 07/27/19 0755  CRP  --   --  6.6*  BNP  --   --  1,559.8*  SARSCOV2NAA NEGATIVE NEGATIVE  --     ------------------------------------------------------------------------------------------------------------------ No results for input(s): CHOL, HDL, LDLCALC, TRIG, CHOLHDL, LDLDIRECT in the last 72 hours.  Lab Results  Component Value Date   HGBA1C 7.2 (H) 07/08/2019   ------------------------------------------------------------------------------------------------------------------ No results for input(s): TSH, T4TOTAL, T3FREE, THYROIDAB in the last 72 hours.  Invalid input(s): FREET3 ------------------------------------------------------------------------------------------------------------------ No results for input(s): VITAMINB12, FOLATE, FERRITIN, TIBC, IRON, RETICCTPCT in the last 72 hours.  Coagulation profile Recent Labs  Lab 07/27/19 0755  INR 1.3*    No results for input(s): DDIMER in the last 72 hours.  Cardiac Enzymes No results for input(s): CKMB, TROPONINI, MYOGLOBIN in the last 168 hours.  Invalid input(s): CK ------------------------------------------------------------------------------------------------------------------    Component Value Date/Time   BNP 1,559.8 (H) 07/27/2019 0755    Micro Results Recent Results (from the past 240 hour(s))  Surgical PCR screen     Status: None   Collection Time: 07/17/19 10:03 AM   Specimen:  Nasal Mucosa; Nasal Swab  Result Value Ref Range Status   MRSA, PCR NEGATIVE NEGATIVE Final   Staphylococcus aureus NEGATIVE NEGATIVE Final    Comment: (NOTE) The Xpert SA Assay (FDA approved for NASAL specimens in patients 57 years of age and older), is one component of a comprehensive surveillance program. It is not intended to diagnose infection nor to guide or monitor treatment. Performed at [facility], [place] [address], [place], [place] [address]   SARS CORONAVIRUS 2 (TAT 6-24 HRS) Nasopharyngeal Nasopharyngeal Swab     Status: None   Collection Time: 07/20/19 11:31 AM   Specimen: Nasopharyngeal Swab  Result Value Ref Range Status   SARS Coronavirus 2 NEGATIVE NEGATIVE Final    Comment: (NOTE) SARS-CoV-2 target nucleic acids are NOT DETECTED. The SARS-CoV-2 RNA is generally detectable in upper and lower respiratory specimens during the  acute phase of infection. Negative results do not preclude SARS-CoV-2 infection, do not rule out co-infections with other pathogens, and should not be used as the sole basis for treatment or other patient management decisions. Negative results must be combined with clinical observations, patient history, and epidemiological information. The expected result is Negative. Fact Sheet for Patients: SugarRoll.be Fact Sheet for Healthcare Providers: https://www.woods-mathews.com/ This test is not yet approved or cleared by the [place] FDA and  has been authorized for detection and/or diagnosis of SARS-CoV-2 by FDA under an Emergency Use Authorization (EUA). This EUA will remain  in effect (meaning this test can be used) for the duration of the COVID-19 declaration under Section 56 4(b)(1) of the Act, 21 U.S.C. section 360bbb-3(b)(1), unless the authorization is terminated or revoked sooner. Performed at [facility], [place] [address] [place], [place] [address]   Respiratory Panel by RT PCR  (Flu A&B, Covid) - Nasopharyngeal Swab     Status: None   Collection Time: 07/26/19  7:00 PM   Specimen: Nasopharyngeal Swab  Result Value Ref Range Status   SARS Coronavirus 2 by RT PCR NEGATIVE NEGATIVE Final    Comment: (NOTE) SARS-CoV-2 target nucleic acids are NOT DETECTED. The SARS-CoV-2 RNA is generally detectable in upper respiratoy specimens during the acute phase of infection. The lowest concentration of SARS-CoV-2 viral copies this assay can detect is 131 copies/mL. A negative result does not preclude SARS-Cov-2 infection and should not be used as the sole basis for treatment or other patient management decisions. A negative result may occur with  improper specimen collection/handling, submission of specimen other than nasopharyngeal swab, presence of viral mutation(s) within the areas targeted by this assay, and inadequate number of viral copies (<131 copies/mL). A negative result must be combined with clinical observations, patient history, and epidemiological information. The expected result is Negative. Fact Sheet for Patients:  PinkCheek.be Fact Sheet for Healthcare Providers:  GravelBags.it This test is not yet ap proved or cleared by the [place] FDA and  has been authorized for detection and/or diagnosis of SARS-CoV-2 by FDA under an Emergency Use Authorization (EUA). This EUA will remain  in effect (meaning this test can be used) for the duration of the COVID-19 declaration under Section 564(b)(1) of the Act, 21 U.S.C. section 360bbb-3(b)(1), unless the authorization is terminated or revoked sooner.    Influenza A by PCR NEGATIVE NEGATIVE Final   Influenza B by PCR NEGATIVE NEGATIVE Final    Comment: (NOTE) The Xpert Xpress SARS-CoV-2/FLU/RSV assay is intended as an aid in  the diagnosis of influenza from Nasopharyngeal swab specimens and  should not be used as a sole basis for treatment. Nasal  washings and  aspirates are unacceptable for Xpert Xpress SARS-CoV-2/FLU/RSV  testing. Fact Sheet for Patients: PinkCheek.be Fact Sheet for Healthcare Providers: GravelBags.it This test is not yet approved or cleared by the [place] FDA and  has been authorized for detection and/or diagnosis of SARS-CoV-2 by  FDA under an Emergency Use Authorization (EUA). This EUA will remain  in effect (meaning this test can be used) for the duration of the  Covid-19 declaration under Section 564(b)(1) of the Act, 21  U.S.C. section 360bbb-3(b)(1), unless the authorization is  terminated or revoked. Performed at [facility], [address], [place],  [address]     Radiology Reports CT ABDOMEN PELVIS WO CONTRAST  Result Date: 07/09/2019 CLINICAL DATA:  Abdomen pain EXAM: CT ABDOMEN AND PELVIS WITHOUT CONTRAST TECHNIQUE: Multidetector CT imaging of  the abdomen and pelvis was performed following the standard protocol without IV contrast. COMPARISON:  05/27/2019, 04/08/2019 FINDINGS: Lower chest: Lung bases demonstrate heterogeneous mostly peripheral opacities likely scarring from prior COVID pneumonia. No consolidation or pleural effusion. Coronary vascular calcification. Mild cardiomegaly. Hepatobiliary: No focal liver abnormality is seen. Status post cholecystectomy. No biliary dilatation. Pancreas: Unremarkable. No pancreatic ductal dilatation or surrounding inflammatory changes. Spleen: Normal in size without focal abnormality. Adrenals/Urinary Tract: Adrenal glands are slightly thickened but without dominant mass. Atrophic kidneys with extensive vascular calcification. No hydronephrosis. Unremarkable urinary bladder Stomach/Bowel: Stomach is within normal limits. Appendix appears normal. No evidence of bowel wall thickening, distention, or inflammatory changes. Vascular/Lymphatic: Extensive aortic atherosclerosis. No aneurysm. No suspicious  adenopathy Reproductive: IUD in the uterus Other: No free air. Moderate to large loculated ascites within the anterior abdominal cavity. Peritoneal dialysis catheter coiled within the right anterior abdomen. Musculoskeletal: No acute or suspicious osseous abnormality. IMPRESSION: 1. Moderate to large volume of loculated appearing ascites within the anterior abdominal cavity 2. Heterogenous peripheral densities at the bases felt secondary to prior COVID pneumonia and scarring 3. Cardiomegaly.  Anasarca.  Atrophic native kidneys Electronically Signed   By: Donavan Foil M.D.   On: 07/09/2019 03:22   CT ABDOMEN PELVIS W CONTRAST  Result Date: 07/26/2019 CLINICAL DATA:  Anemia, dialysis EXAM: CT ABDOMEN AND PELVIS WITH CONTRAST TECHNIQUE: Multidetector CT imaging of the abdomen and pelvis was performed using the standard protocol following bolus administration of intravenous contrast. CONTRAST:  12mL OMNIPAQUE IOHEXOL 300 MG/ML  SOLN COMPARISON:  07/09/2019, 05/27/2019 FINDINGS: Lower chest: There is extensive bilateral ground-glass and irregular interstitial opacity in the included lung bases. Cardiomegaly. Coronary artery calcifications. Hepatobiliary: No focal liver abnormality is seen. Status post cholecystectomy. No biliary dilatation. Pancreas: Unremarkable. No pancreatic ductal dilatation or surrounding inflammatory changes. Spleen: Normal in size without significant abnormality. Adrenals/Urinary Tract: Adrenal glands are unremarkable. Atrophic appearing kidneys. No hydronephrosis. Bladder is unremarkable. Stomach/Bowel: Stomach is within normal limits. Appendix appears normal. No evidence of bowel wall thickening, distention, or inflammatory changes. Vascular/Lymphatic: Aortic atherosclerosis. Extensive vascular calcinosis. No enlarged abdominal or pelvic lymph nodes. Reproductive: No mass or other significant abnormality. Other: Anasarca. Interval enlargement of a large, loculated fluid collection in the  ventral abdomen, measuring approximately 29.0 x 14.0 x 21.0 cm, now containing extensive internal heterogeneous attenuation. There is a tunneled Tenckhoff type peritoneal dialysis catheter within this collection. Musculoskeletal: Plate and screw fixation of the left acetabulum. IMPRESSION: 1. Interval enlargement of a large, loculated fluid collection in the ventral abdomen, measuring approximately 29.0 x 14.0 x 21.0 cm, now containing extensive internal heterogeneous attenuation. This appearance is most consistent with a pseudocyst in the setting of peritoneal dialysis and concerning for internal hemorrhage given report of anemia. 2. Extensive bilateral ground-glass and irregular interstitial opacity in the included bilateral lung bases, similar to prior examination and concerning for fibrotic interstitial lung disease although incompletely characterized. Consider dedicated ILD protocol imaging of the chest on a nonemergent basis. 3. Other chronic and incidental findings as above. Aortic Atherosclerosis (ICD10-I70.0). Electronically Signed   By: Eddie Candle M.D.   On: 07/26/2019 16:41   IR Fluoro Guide CV Line Right  Result Date: 07/17/2019 CLINICAL DATA:  End-stage renal disease. Recent removal of peritoneal dialysis catheter. Hemodialysis access required EXAM: TUNNELED HEMODIALYSIS CATHETER PLACEMENT WITH ULTRASOUND AND FLUOROSCOPIC GUIDANCE TECHNIQUE: The procedure, risks, benefits, and alternatives were explained to the patient. Questions regarding the procedure were encouraged and answered. The patient understands and consents  to the procedure. As antibiotic prophylaxis, cefazolin 3 g was ordered pre-procedure and administered intravenously within one hour of incision.Patency of the right IJ vein was confirmed with ultrasound with image documentation. An appropriate skin site was determined. Region was prepped using maximum barrier technique including cap and mask, sterile gown, sterile gloves, large  sterile sheet, and Chlorhexidine as cutaneous antisepsis. The region was infiltrated locally with 1% lidocaine. Intravenous Fentanyl 37mcg and Versed 1mg  were administered as conscious sedation during continuous monitoring of the patient's level of consciousness and physiological / cardiorespiratory status by the radiology RN, with a total moderate sedation time of 15 minutes. Under real-time ultrasound guidance, the right IJ vein was accessed with a 21 gauge micropuncture needle; the needle tip within the vein was confirmed with ultrasound image documentation. Needle exchanged over the 018 guidewire for transitional dilator, which allowed advancement of a Benson wire into the IVC. Over this, an MPA catheter was advanced. A Palindrome 19 hemodialysis catheter was tunneled from the right anterior chest wall approach to the right IJ dermatotomy site. The MPA catheter was exchanged over an Amplatz wire for serial vascular dilators which allow placement of a peel-away sheath, through which the catheter was advanced under intermittent fluoroscopy, positioned with its tips in the proximal and midright atrium. Spot chest radiograph confirms good catheter position. No pneumothorax. Catheter was flushed and primed per protocol. Catheter secured externally with O Prolene sutures. The right IJ dermatotomy site was closed with Dermabond. COMPLICATIONS: COMPLICATIONS None immediate FLUOROSCOPY TIME:  0.5 minutes; 70 uGym2 DAP COMPARISON:  None IMPRESSION: 1. Technically successful placement of tunneled right IJ hemodialysis catheter with ultrasound and fluoroscopic guidance. Ready for routine use. ACCESS: Remains approachable for percutaneous intervention as needed. Electronically Signed   By: Lucrezia Europe M.D.   On: 07/17/2019 15:43   IR US Guide Vasc Access Right  Result Date: 07/17/2019 CLINICAL DATA:  End-stage renal disease. Recent removal of peritoneal dialysis catheter. Hemodialysis access required EXAM: TUNNELED  HEMODIALYSIS CATHETER PLACEMENT WITH ULTRASOUND AND FLUOROSCOPIC GUIDANCE TECHNIQUE: The procedure, risks, benefits, and alternatives were explained to the patient. Questions regarding the procedure were encouraged and answered. The patient understands and consents to the procedure. As antibiotic prophylaxis, cefazolin 3 g was ordered pre-procedure and administered intravenously within one hour of incision.Patency of the right IJ vein was confirmed with ultrasound with image documentation. An appropriate skin site was determined. Region was prepped using maximum barrier technique including cap and mask, sterile gown, sterile gloves, large sterile sheet, and Chlorhexidine as cutaneous antisepsis. The region was infiltrated locally with 1% lidocaine. Intravenous Fentanyl 61mcg and Versed 1mg  were administered as conscious sedation during continuous monitoring of the patient's level of consciousness and physiological / cardiorespiratory status by the radiology RN, with a total moderate sedation time of 15 minutes. Under real-time ultrasound guidance, the right IJ vein was accessed with a 21 gauge micropuncture needle; the needle tip within the vein was confirmed with ultrasound image documentation. Needle exchanged over the 018 guidewire for transitional dilator, which allowed advancement of a Benson wire into the IVC. Over this, an MPA catheter was advanced. A Palindrome 19 hemodialysis catheter was tunneled from the right anterior chest wall approach to the right IJ dermatotomy site. The MPA catheter was exchanged over an Amplatz wire for serial vascular dilators which allow placement of a peel-away sheath, through which the catheter was advanced under intermittent fluoroscopy, positioned with its tips in the proximal and midright atrium. Spot chest radiograph confirms good catheter position.  No pneumothorax. Catheter was flushed and primed per protocol. Catheter secured externally with O Prolene sutures. The right  IJ dermatotomy site was closed with Dermabond. COMPLICATIONS: COMPLICATIONS None immediate FLUOROSCOPY TIME:  0.5 minutes; 70 uGym2 DAP COMPARISON:  None IMPRESSION: 1. Technically successful placement of tunneled right IJ hemodialysis catheter with ultrasound and fluoroscopic guidance. Ready for routine use. ACCESS: Remains approachable for percutaneous intervention as needed. Electronically Signed   By: Lucrezia Europe M.D.   On: 07/17/2019 15:43   DG Chest Port 1 View  Result Date: 07/09/2019 CLINICAL DATA:  Short of breath EXAM: PORTABLE CHEST 1 VIEW COMPARISON:  04/02/2019 FINDINGS: Low lung volumes. Cardiomegaly. Diffuse bilateral reticular and ground-glass opacity. No pleural effusion or pneumothorax. IMPRESSION: Cardiomegaly with mild diffuse fine reticular and ground-glass opacity similar compared to prior, question underlying chronic interstitial lung disease. Electronically Signed   By: Donavan Foil M.D.   On: 07/09/2019 00:44   ECHOCARDIOGRAM COMPLETE  Result Date: 07/09/2019    ECHOCARDIOGRAM REPORT   Patient Name:   [facility] Date of Exam: 07/09/2019 Medical Rec #:  [id number]          Height:       68.0 in Accession #:    [id number]         Weight:       250.7 lb Date of Birth:  [birth year]           BSA:          2.250 m Patient Age:    42 years           BP:           161/73 mmHg Patient Gender: F                  HR:           76 bpm. Exam Location:  Inpatient Procedure: 2D Echo, Cardiac Doppler and Color Doppler Indications:    I50.23 Acute on chronic systolic (congestive) heart failure  History:        Patient has no prior history of Echocardiogram examinations.                 Risk Factors:Hypertension and Diabetes. ESRD. COVID-19.  Sonographer:    Jonelle Sidle Dance Referring Phys: [id number] [place]  1. Left ventricular ejection fraction, by estimation, is 50 to 55%. The left ventricle has normal function. The left ventricle demonstrates regional wall motion  abnormalities (see scoring diagram/findings for description). There is moderate concentric left ventricular hypertrophy. Left ventricular diastolic parameters are consistent with Grade II diastolic dysfunction (pseudonormalization). Elevated left atrial pressure. There is mild hypokinesis of the left ventricular, entire inferolateral wall.  2. Right ventricular systolic function is mildly reduced. The right ventricular size is normal. There is moderately elevated pulmonary artery systolic pressure.  3. Left atrial size was moderately dilated.  4. Right atrial size was mildly dilated.  5. The mitral valve is normal in structure. Trivial mitral valve regurgitation.  6. Tricuspid valve regurgitation is mild to moderate.  7. The aortic valve is normal in structure. Aortic valve regurgitation is trivial.  8. The inferior vena cava is dilated in size with >50% respiratory variability, suggesting right atrial pressure of 8 mmHg. FINDINGS  Left Ventricle: Left ventricular ejection fraction, by estimation, is 50 to 55%. The left ventricle has normal function. The left ventricle demonstrates regional wall motion abnormalities. Mild hypokinesis of the left ventricular, entire inferolateral wall. The left ventricular internal  cavity size was normal in size. There is moderate concentric left ventricular hypertrophy. Left ventricular diastolic parameters are consistent with Grade II diastolic dysfunction (pseudonormalization). Elevated left atrial pressure. Right Ventricle: The right ventricular size is normal. No increase in right ventricular wall thickness. Right ventricular systolic function is mildly reduced. There is moderately elevated pulmonary artery systolic pressure. The tricuspid regurgitant velocity is 2.91 m/s, and with an assumed right atrial pressure of 8 mmHg, the estimated right ventricular systolic pressure is 47.0 mmHg. Left Atrium: Left atrial size was moderately dilated. Right Atrium: Right atrial size was  mildly dilated. Pericardium: There is no evidence of pericardial effusion. Mitral Valve: The mitral valve is normal in structure. Mild mitral annular calcification. Trivial mitral valve regurgitation. Tricuspid Valve: The tricuspid valve is normal in structure. Tricuspid valve regurgitation is mild to moderate. Aortic Valve: The aortic valve is normal in structure. Aortic valve regurgitation is trivial. Pulmonic Valve: The pulmonic valve was normal in structure. Pulmonic valve regurgitation is not visualized. Aorta: The aortic root and ascending aorta are structurally normal, with no evidence of dilitation. Venous: The inferior vena cava is dilated in size with greater than 50% respiratory variability, suggesting right atrial pressure of 8 mmHg. IAS/Shunts: No atrial level shunt detected by color flow Doppler.  LEFT VENTRICLE PLAX 2D LVIDd:         5.30 cm  Diastology LVIDs:         3.70 cm  LV e' lateral:   8.05 cm/s LV PW:         1.50 cm  LV E/e' lateral: 16.8 LV IVS:        1.30 cm  LV e' medial:    5.22 cm/s LVOT diam:     1.90 cm  LV E/e' medial:  25.9 LV SV:         57 LV SV Index:   25 LVOT Area:     2.84 cm  RIGHT VENTRICLE            IVC RV Basal diam:  3.50 cm    IVC diam: 2.20 cm RV Mid diam:    2.10 cm RV S prime:     9.03 cm/s TAPSE (M-mode): 2.0 cm LEFT ATRIUM              Index       RIGHT ATRIUM           Index LA diam:        4.80 cm  2.13 cm/m  RA Area:     19.00 cm LA Vol (A2C):   107.0 ml 47.56 ml/m RA Volume:   54.50 ml  24.23 ml/m LA Vol (A4C):   77.4 ml  34.40 ml/m LA Biplane Vol: 94.9 ml  42.18 ml/m  AORTIC VALVE LVOT Vmax:   83.00 cm/s LVOT Vmean:  61.400 cm/s LVOT VTI:    0.200 m  AORTA Ao Root diam: 3.10 cm Ao Asc diam:  3.15 cm MITRAL VALVE                TRICUSPID VALVE MV Area (PHT): 4.21 cm     TR Peak grad:   33.9 mmHg MV Decel Time: 180 msec     TR Vmax:        291.00 cm/s MV E velocity: 135.00 cm/s MV A velocity: 79.60 cm/s   SHUNTS MV E/A ratio:  1.70         Systemic VTI:   0.20 m  Systemic Diam: 1.90 cm Sanda Klein MD Electronically signed by Sanda Klein MD Signature Date/Time: 07/09/2019/1:54:09 PM    Final    VAS [place] UPPER EXT VEIN MAPPING (PRE-OP AVF)  Result Date: 07/18/2019 UPPER EXTREMITY VEIN MAPPING  Indications: Pre-access. Comparison Study: No prior studies. Performing Technologist: Oliver Hum RVT  Examination Guidelines: A complete evaluation includes B-mode imaging, spectral Doppler, color Doppler, and power Doppler as needed of all accessible portions of each vessel. Bilateral testing is considered an integral part of a complete examination. Limited examinations for reoccurring indications may be performed as noted. +-----------------+-------------+----------+--------------+ Right Cephalic   Diameter (cm)Depth (cm)   Findings    +-----------------+-------------+----------+--------------+ Shoulder             0.32        1.30                  +-----------------+-------------+----------+--------------+ Prox upper arm       0.52        0.75     branching    +-----------------+-------------+----------+--------------+ Mid upper arm        0.48        0.93                  +-----------------+-------------+----------+--------------+ Dist upper arm       0.36        0.49     branching    +-----------------+-------------+----------+--------------+ Antecubital fossa    0.52        0.23      Thrombus    +-----------------+-------------+----------+--------------+ Prox forearm         0.29        0.75     branching    +-----------------+-------------+----------+--------------+ Mid forearm          0.31        0.49                  +-----------------+-------------+----------+--------------+ Dist forearm                            not visualized +-----------------+-------------+----------+--------------+ +-----------------+-------------+----------+---------+ Right Basilic    Diameter (cm)Depth  (cm)Findings  +-----------------+-------------+----------+---------+ Shoulder             0.58        1.30             +-----------------+-------------+----------+---------+ Mid upper arm        0.40        1.50             +-----------------+-------------+----------+---------+ Dist upper arm       0.52        1.10             +-----------------+-------------+----------+---------+ Antecubital fossa    0.27        0.74   branching +-----------------+-------------+----------+---------+ Prox forearm         0.23        0.43             +-----------------+-------------+----------+---------+ Mid forearm          0.26        0.17             +-----------------+-------------+----------+---------+ Distal forearm       0.29        0.14             +-----------------+-------------+----------+---------+ +-----------------+-------------+----------+----------------------+ Left Cephalic    Diameter (cm)Depth (cm)  Findings        +-----------------+-------------+----------+----------------------+ Shoulder             0.33        0.87                          +-----------------+-------------+----------+----------------------+ Prox upper arm       0.26        1.30         branching        +-----------------+-------------+----------+----------------------+ Mid upper arm        0.24        0.98                          +-----------------+-------------+----------+----------------------+ Dist upper arm       0.21        0.60         branching        +-----------------+-------------+----------+----------------------+ Antecubital fossa    0.32        0.28                          +-----------------+-------------+----------+----------------------+ Prox forearm         0.49        0.84   branching and Thrombus +-----------------+-------------+----------+----------------------+ Mid forearm                                 not visualized      +-----------------+-------------+----------+----------------------+ Dist forearm         0.21        0.31                          +-----------------+-------------+----------+----------------------+ +-----------------+-------------+----------+---------+ Left Basilic     Diameter (cm)Depth (cm)Findings  +-----------------+-------------+----------+---------+ Shoulder             0.68        1.50             +-----------------+-------------+----------+---------+ Mid upper arm        0.46        1.40             +-----------------+-------------+----------+---------+ Dist upper arm       0.57        1.20             +-----------------+-------------+----------+---------+ Antecubital fossa    0.43        0.68   branching +-----------------+-------------+----------+---------+ Prox forearm         0.41        0.55   branching +-----------------+-------------+----------+---------+ Mid forearm          0.33        0.60   branching +-----------------+-------------+----------+---------+ Distal forearm       0.24        0.44             +-----------------+-------------+----------+---------+ *See table(s) above for measurements and observations.  Diagnosing physician: Monica Martinez MD Electronically signed by Monica Martinez MD on 07/18/2019 at 8:13:03 PM.    Final    IR Paracentesis  Result Date: 07/09/2019 INDICATION: Patient with history of end stage renal disease admitted with abdominal pain and ascites presents for diagnostic paracentesis maximum 100 ml per Team's request. EXAM: ULTRASOUND GUIDED DIAGNOSTIC PARACENTESIS MEDICATIONS: Lidocaine 1% 10  mL COMPLICATIONS: None immediate. PROCEDURE: Informed written consent was obtained from the patient after a discussion of the risks, benefits and alternatives to treatment. A timeout was performed prior to the initiation of the procedure. Initial ultrasound scanning demonstrates a large amount of ascites within the right lower  abdominal quadrant. The right lower abdomen was prepped and draped in the usual sterile fashion. 1% lidocaine was used for local anesthesia. Following this, a 19 gauge, 10-cm, Yueh catheter was introduced. An ultrasound image was saved for documentation purposes. The paracentesis was performed. The catheter was removed and a dressing was applied. The patient tolerated the procedure well without immediate post procedural complication. FINDINGS: A total of approximately 100 m of clear fluid was removed. Samples were sent to the laboratory as requested by the clinical team. IMPRESSION: Successful ultrasound-guided therapeutic paracentesis yielding 100 mililiters of peritoneal fluid. Read by Rushie Nyhan NP Electronically Signed   By: Markus Daft M.D.   On: 07/09/2019 09:12    Time Spent in minutes  30   Lala Lund M.D on 07/27/2019 at 9:58 AM  To page go to [email or web address] - password [facility]

## 2019-07-27 NOTE — Plan of Care (Signed)
  Problem: Clinical Measurements: Goal: Ability to maintain clinical measurements within normal limits will improve Outcome: Progressing   Problem: Clinical Measurements: Goal: Respiratory complications will improve Outcome: Progressing   Problem: Clinical Measurements: Goal: Cardiovascular complication will be avoided Outcome: Progressing   

## 2019-07-27 NOTE — Progress Notes (Signed)
Blood Bank called HD unit indicating to me 1 unit PRBC ready to administer and since the patient had antibiodies the 2nd unit is not ready as yet. Blood Bank informed I am the only nurse in the HD unit; therefore, the floor nurse will need to come to the Blood Bank because I cannot leave the HD unit occupied with 2 HD patients on the machines and pick up the blood so that I can administer it to the patient in the HD unit- verbalized understanding. I told the Blood Bank Rep I would call the patient's primary floor nurse and explain this.  I called the patients primary nurse Ezekiel Ina RN and explained the same above information. That I am the only nurse in the HD unit with 2 patients on machines and I cannot leave the HD unit unoccupied with a nurse to pick up blood from the Blood Bank and if the patient was going to receive blood while in the HD unit she would need to pick it up, bring it to me and verify it with me to administer. Cleda Clarks indicated she understood my explanation as I gave it.

## 2019-07-28 LAB — GLUCOSE, CAPILLARY
Glucose-Capillary: 92 mg/dL (ref 70–99)
Glucose-Capillary: 114 mg/dL — ABNORMAL HIGH (ref 70–99)
Glucose-Capillary: 113 mg/dL — ABNORMAL HIGH (ref 70–99)
Glucose-Capillary: 188 mg/dL — ABNORMAL HIGH (ref 70–99)

## 2019-07-28 LAB — CBC
HCT: 28.4 % — ABNORMAL LOW (ref 36.0–46.0)
HCT: 28.4 % — ABNORMAL LOW (ref 36.0–46.0)
Hemoglobin: 9 g/dL — ABNORMAL LOW (ref 12.0–15.0)
Hemoglobin: 9 g/dL — ABNORMAL LOW (ref 12.0–15.0)
MCH: 25.9 pg — ABNORMAL LOW (ref 26.0–34.0)
MCH: 26.2 pg (ref 26.0–34.0)
MCHC: 31.7 g/dL (ref 30.0–36.0)
MCHC: 31.7 g/dL (ref 30.0–36.0)
MCV: 81.8 fL (ref 80.0–100.0)
MCV: 82.6 fL (ref 80.0–100.0)
Platelets: 184 10*3/uL (ref 150–400)
Platelets: 219 10*3/uL (ref 150–400)
RBC: 3.47 MIL/uL — ABNORMAL LOW (ref 3.87–5.11)
RBC: 3.44 MIL/uL — ABNORMAL LOW (ref 3.87–5.11)
RDW: 20.6 % — ABNORMAL HIGH (ref 11.5–15.5)
RDW: 21 % — ABNORMAL HIGH (ref 11.5–15.5)
WBC: 12.3 10*3/uL — ABNORMAL HIGH (ref 4.0–10.5)
WBC: 12.4 10*3/uL — ABNORMAL HIGH (ref 4.0–10.5)
nRBC: 0.2 % (ref 0.0–0.2)
nRBC: 0 % (ref 0.0–0.2)

## 2019-07-28 LAB — COMPREHENSIVE METABOLIC PANEL
ALT: 6 U/L (ref 0–44)
AST: 16 U/L (ref 15–41)
Albumin: 2.8 g/dL — ABNORMAL LOW (ref 3.5–5.0)
Alkaline Phosphatase: 70 U/L (ref 38–126)
Anion gap: 12 (ref 5–15)
BUN: 19 mg/dL (ref 6–20)
CO2: 27 mmol/L (ref 22–32)
Calcium: 8.9 mg/dL (ref 8.9–10.3)
Chloride: 97 mmol/L — ABNORMAL LOW (ref 98–111)
Creatinine, Ser: 4.83 mg/dL — ABNORMAL HIGH (ref 0.44–1.00)
GFR calc Af Amer: 11 mL/min — ABNORMAL LOW (ref >60)
GFR calc non Af Amer: 10 mL/min — ABNORMAL LOW (ref >60)
Glucose, Bld: 117 mg/dL — ABNORMAL HIGH (ref 70–99)
Potassium: 4.4 mmol/L (ref 3.5–5.1)
Sodium: 136 mmol/L (ref 135–145)
Total Bilirubin: 0.9 mg/dL (ref 0.3–1.2)
Total Protein: 7.8 g/dL (ref 6.5–8.1)

## 2019-07-28 LAB — CBC WITH DIFFERENTIAL/PLATELET
Abs Immature Granulocytes: 0.06 10*3/uL (ref 0.00–0.07)
Basophils Absolute: 0.1 10*3/uL (ref 0.0–0.1)
Basophils Relative: 1 %
Eosinophils Absolute: 0.4 10*3/uL (ref 0.0–0.5)
Eosinophils Relative: 4 %
HCT: 27.3 % — ABNORMAL LOW (ref 36.0–46.0)
Hemoglobin: 8.8 g/dL — ABNORMAL LOW (ref 12.0–15.0)
Immature Granulocytes: 1 %
Lymphocytes Relative: 12 %
Lymphs Abs: 1.2 10*3/uL (ref 0.7–4.0)
MCH: 26.3 pg (ref 26.0–34.0)
MCHC: 32.2 g/dL (ref 30.0–36.0)
MCV: 81.5 fL (ref 80.0–100.0)
Monocytes Absolute: 1.2 10*3/uL — ABNORMAL HIGH (ref 0.1–1.0)
Monocytes Relative: 11 %
Neutro Abs: 7.5 10*3/uL (ref 1.7–7.7)
Neutrophils Relative %: 71 %
Platelets: 207 10*3/uL (ref 150–400)
RBC: 3.35 MIL/uL — ABNORMAL LOW (ref 3.87–5.11)
RDW: 20.3 % — ABNORMAL HIGH (ref 11.5–15.5)
WBC: 10.5 10*3/uL (ref 4.0–10.5)
nRBC: 0.3 % — ABNORMAL HIGH (ref 0.0–0.2)

## 2019-07-28 LAB — BRAIN NATRIURETIC PEPTIDE: B Natriuretic Peptide: 2777.2 pg/mL — ABNORMAL HIGH (ref 0.0–100.0)

## 2019-07-28 LAB — MAGNESIUM: Magnesium: 1.8 mg/dL (ref 1.7–2.4)

## 2019-07-28 LAB — PHOSPHORUS: Phosphorus: 4.1 mg/dL (ref 2.5–4.6)

## 2019-07-28 MED ORDER — HYDROCODONE-ACETAMINOPHEN 5-325 MG PO TABS
1.0000 | ORAL_TABLET | Freq: Four times a day (QID) | ORAL | Status: DC | PRN
  Administered 2019-07-28: 1 via ORAL
  Administered 2019-07-28 (×2): 2 via ORAL
  Administered 2019-07-30: 1 via ORAL
  Filled 2019-07-28: qty 2
  Filled 2019-07-28: qty 1
  Filled 2019-07-28 (×2): qty 2

## 2019-07-28 MED ORDER — LISINOPRIL 20 MG PO TABS
20.0000 mg | ORAL_TABLET | Freq: Every day | ORAL | Status: DC
  Administered 2019-07-28: 20 mg via ORAL
  Filled 2019-07-28 (×2): qty 1

## 2019-07-28 NOTE — Plan of Care (Signed)
Patient out of bed to chair today, complaints of stiff/achy back and itching, Patient had PRN medication which resolved symptoms. Patient used call bell when she needs assistance

## 2019-07-28 NOTE — Progress Notes (Signed)
Patient ID: Janet Mitchell, female   DOB: [birth year], 54 y.o.   MRN: [id number]  [facility] Progress Note   Assessment/ Plan:   1.  Acute blood loss anemia: This appears to be superimposed on chronic anemia of ESRD with bleeding around her PD catheter site based on CT scan findings.  This appears to have clinically stopped and hemoglobin/hematocrit improved commensurate with PRBC transfusion.  No plans for surgical intervention and will likely need elective removal of PD catheter as an outpatient. 2. ESRD: Recently converted to hemodialysis from peritoneal dialysis following presumptive membrane failure.  Will undertake daily hemodialysis at this time for efforts at volume unloading.  Avoid heparin with dialysis.  Status post left first stage BBF on 07/22/2019. 3. CKD-MBD: Continue calcitriol for PTH suppression and check phosphorus with labs tomorrow.  Remains on renal diet. 4. Nutrition: Continue renal diet with ONS, renal multivitamin. 5. Hypertension: Blood pressure intermittently elevated but with some episodes of intradialytic blood pressure drop limiting ultrafiltration.  Significant volume overload noted.  I will discontinue amlodipine with the suspicion that this might be contributing to her lower extremity edema and switch her over to lisinopril.  Subjective:   Reports to be feeling fair, minimal abdominal discomfort with continued discomfort from lower extremity edema/fullness of her flanks.   Objective:   BP (!) 154/76   Pulse 81   Temp 99.5 F (37.5 C)   Resp (!) 25   Ht 5' 9.6" (1.768 m)   Wt 124.4 kg   SpO2 91%   BMI 39.80 kg/m   Physical Exam: Gen: Comfortably sitting up on the side of her bed CVS: Pulse regular rhythm, normal S1 and S2 Resp: Diminished breath sounds over bases, no distinct rales Abd: Soft, obese, tender around epigastric/periumbilical area Ext: 3+ edema over lower extremities.  Left upper arm with palpable thrill over  BBF  Labs: BMET Recent Labs  Lab 07/22/19 0818 07/26/19 1407 07/27/19 0755  NA 136 136 138  K 4.6 4.3 5.1  CL 101 98 100  CO2  --  27 27  GLUCOSE 139* 187* 143*  BUN 51* 34* 33*  CREATININE 9.60* 5.86* 6.49*  CALCIUM  --  8.5* 9.1   CBC Recent Labs  Lab 07/26/19 1140 07/26/19 1407 07/27/19 0755 07/27/19 1906  WBC  --  9.5 8.2 10.4  NEUTROABS  --  5.8  --   --   HGB 4.3* 4.7* 6.0* 6.6*  HCT 14.6* 16.1* 19.5* 21.0*  MCV  --  81.3 83.0 79.5*  PLT  --  227 209 203     Medications:    . amLODipine  10 mg Oral Daily  . [START ON 07/29/2019] calcitRIOL  0.5 mcg Oral Once per day on Mon Tue Wed Thu Fri  . calcitRIOL  1 mcg Oral Once per day on Sun Sat  . Chlorhexidine Gluconate Cloth  6 each Topical Daily  . diphenhydrAMINE      . insulin aspart  0-9 Units Subcutaneous Q6H  . multivitamin  1 tablet Oral QHS   Elmarie Shiley, MD 07/28/2019, 10:04 AM

## 2019-07-28 NOTE — Progress Notes (Signed)
Inpatient Rehab Admissions Coordinator Note:   Per therapy recommendations, pt was screened for CIR candidacy by Tyshun Tuckerman, MS CCC-SLP. At this time, Pt. Appears to have functional decline and is a good candidate for CIR. Will place order for rehab consult per protocol.  Please contact me with questions.   Manases Etchison, MS, CCC-SLP Rehab Admissions Coordinator  [phone] (celll) [phone] (office)  

## 2019-07-28 NOTE — Progress Notes (Signed)
Pt reassessed and there is no change from earlier assessment.

## 2019-07-28 NOTE — Progress Notes (Signed)
Patient sleeping and stats dropped to 83% placed 2l of O2, Walker and patient up 91%

## 2019-07-28 NOTE — Progress Notes (Signed)
[facility] Progress Note     Subjective: CC-  Feeling a little better this morning, less fatigue. Sitting up on the side of the bed, denies lightheadedness or dizziness. States that the left side of her abdomen feels a little firm, but she denies abdominal pain, nausea, or vomiting. Tolerating solid food and had a BM yesterday. Hgb 6.6 from 4.3 yesterday, getting another blood transfusion today. HR/BP stable.  Objective: Vital signs in last 24 hours: Temp:  [97.7 F (36.5 C)-100 F (37.8 C)] 99.5 F (37.5 C) (05/02 0932) Pulse Rate:  [77-81] 81 (05/02 0932) Resp:  [12-25] 25 (05/02 0932) BP: (135-180)/(54-99) 154/76 (05/02 0932) SpO2:  [88 %-100 %] 91 % (05/02 0932) Weight:  [124.4 kg-126.5 kg] 124.4 kg (05/02 0020) Last BM Date: 07/27/19  Intake/Output from previous day: 05/01 0701 - 05/02 0700 In: 875 [P.O.:240; Blood:635] Out: 3000  Intake/Output this shift: Total I/O In: 360 [Blood:360] Out: -   PE: Gen:  Alert, NAD, pleasant HEENT: EOM's intact, pupils equal and round Card:  RRR, 1-2+ pitting edema BLE Pulm:  CTAB, no W/R/R, rate and effort normal Abd: obese, soft, ND, few BS heard Psych: A&Ox4  Skin: no rashes noted, warm and dry  Lab Results:  Recent Labs    07/27/19 0755 07/27/19 1906  WBC 8.2 10.4  HGB 6.0* 6.6*  HCT 19.5* 21.0*  PLT 209 203   BMET Recent Labs    07/26/19 1407 07/27/19 0755  NA 136 138  K 4.3 5.1  CL 98 100  CO2 27 27  GLUCOSE 187* 143*  BUN 34* 33*  CREATININE 5.86* 6.49*  CALCIUM 8.5* 9.1   PT/INR Recent Labs    07/27/19 0755  LABPROT 15.2  INR 1.3*   CMP     Component Value Date/Time   NA 138 07/27/2019 0755   K 5.1 07/27/2019 0755   CL 100 07/27/2019 0755   CO2 27 07/27/2019 0755   GLUCOSE 143 (H) 07/27/2019 0755   BUN 33 (H) 07/27/2019 0755   CREATININE 6.49 (H) 07/27/2019 0755   CALCIUM 9.1 07/27/2019 0755   PROT 7.0 07/10/2019 0419   ALBUMIN 2.3 (L) 07/19/2019 0638   AST 11 (L)  07/10/2019 0419   ALT 10 07/10/2019 0419   ALKPHOS 47 07/10/2019 0419   BILITOT 0.8 07/10/2019 0419   GFRNONAA 7 (L) 07/27/2019 0755   GFRAA 8 (L) 07/27/2019 0755   Lipase     Component Value Date/Time   LIPASE 19 12/18/2018 0037       Studies/Results: CT ABDOMEN PELVIS W CONTRAST  Result Date: 07/26/2019 CLINICAL DATA:  Anemia, dialysis EXAM: CT ABDOMEN AND PELVIS WITH CONTRAST TECHNIQUE: Multidetector CT imaging of the abdomen and pelvis was performed using the standard protocol following bolus administration of intravenous contrast. CONTRAST:  125mL OMNIPAQUE IOHEXOL 300 MG/ML  SOLN COMPARISON:  07/09/2019, 05/27/2019 FINDINGS: Lower chest: There is extensive bilateral ground-glass and irregular interstitial opacity in the included lung bases. Cardiomegaly. Coronary artery calcifications. Hepatobiliary: No focal liver abnormality is seen. Status post cholecystectomy. No biliary dilatation. Pancreas: Unremarkable. No pancreatic ductal dilatation or surrounding inflammatory changes. Spleen: Normal in size without significant abnormality. Adrenals/Urinary Tract: Adrenal glands are unremarkable. Atrophic appearing kidneys. No hydronephrosis. Bladder is unremarkable. Stomach/Bowel: Stomach is within normal limits. Appendix appears normal. No evidence of bowel wall thickening, distention, or inflammatory changes. Vascular/Lymphatic: Aortic atherosclerosis. Extensive vascular calcinosis. No enlarged abdominal or pelvic lymph nodes. Reproductive: No mass or other significant abnormality. Other: Anasarca. Interval enlargement of  a large, loculated fluid collection in the ventral abdomen, measuring approximately 29.0 x 14.0 x 21.0 cm, now containing extensive internal heterogeneous attenuation. There is a tunneled Tenckhoff type peritoneal dialysis catheter within this collection. Musculoskeletal: Plate and screw fixation of the left acetabulum. IMPRESSION: 1. Interval enlargement of a large, loculated  fluid collection in the ventral abdomen, measuring approximately 29.0 x 14.0 x 21.0 cm, now containing extensive internal heterogeneous attenuation. This appearance is most consistent with a pseudocyst in the setting of peritoneal dialysis and concerning for internal hemorrhage given report of anemia. 2. Extensive bilateral ground-glass and irregular interstitial opacity in the included bilateral lung bases, similar to prior examination and concerning for fibrotic interstitial lung disease although incompletely characterized. Consider dedicated ILD protocol imaging of the chest on a nonemergent basis. 3. Other chronic and incidental findings as above. Aortic Atherosclerosis (ICD10-I70.0). Electronically Signed   By: Eddie Candle M.D.   On: 07/26/2019 16:41    Anti-infectives: Anti-infectives (From admission, onward)   None       Assessment/Plan HTN HLD IDDM ESRD on HD from PD since 07/17/19 Obesity Interstitial lung disease, recent covid 02/2019 GERD  Intraabdominal fluid collection concerning for pseudocyst with internal hemorrhage Acute blood loss anemia - s/p 3 units PRBCs 5/1 and 1 unit 5/2 - Hgb 6.6<<6<<4.7<<4.3 - hemodynamically stable and abdomen nontender - no role for surgical intervention, continue transfusion PRN primary  ID - none VTE - SCDs only FEN - HH/CM diet Foley - none Follow up - TBD   LOS: 2 days    Wellington Hampshire, [facility] 07/28/2019, 10:05 AM Please see Amion for pager number during day hours 7:00am-4:30pm

## 2019-07-28 NOTE — Evaluation (Signed)
Physical Therapy Evaluation Patient Details Name: Janet Mitchell MRN: [id number] DOB: 07/01/1965 Today's Date: 07/28/2019   History of Present Illness  Janet Mitchell  is a 54 y.o. female, with past medical history of end-stage renal disease on peritoneal hemodialysis since 2017, recently transition to hemodialysis 4/21, hypertension, diabetes mellitus type 2 , patient with rehospitalization at [facility] secondary to peritoneal dialysis catheter peritonitis, which was treated, then she was started on dialysis, first session as an outpatient with this Monday, she presented to dialysis, her repeat CBC came back significant for drop in hemoglobin to 4.3, from baseline 9.2, patient herself only complains of fatigue, dyspnea at baseline, she denies any chest pain, dizziness, lightheadedness, syncope or presyncope, as well denies fever or chills, no melena, coffee-ground emesis, she reports mild periods where she used only 1 pad recently.  Clinical Impression   Pt admitted with above diagnosis. Comes from home, where she lives with her husband in a one story house with 5 steps to enter; Prior to latest admission, was walking without assistive device, managing independently; using a RW after recent admission, and a wheelchair for community access; Presents to PT with generalized weakness, decr functional mobility, decr overall functional independence; Needed [place] mod assist to rise sit to stand, and dependent on UE support for balance while marching in place; We discussed dc options, and she is hesitant for more time away from home, but cautiously open to CIR for post-acute rehab; Pt currently with functional limitations due to the deficits listed below (see PT Problem List). Pt will benefit from skilled PT to increase their independence and safety with mobility to allow discharge to the venue listed below.       Follow Up Recommendations CIR    Equipment Recommendations  Other (comment)(worth  considering a ramp, expecially with the need to get out to HD at least 3x/week)    Recommendations for Other Services OT consult(ordered per protocol)     Precautions / Restrictions Precautions Precautions: Fall Precaution Comments: LUE restricted extremity      Mobility  Bed Mobility                  Transfers Overall transfer level: Needs assistance Equipment used: 1 person hand held assist Transfers: Sit to/from Stand Sit to Stand: Mod assist         General transfer comment: Heavy mod assist to rise from low recliner seat; dependent on UEs to push as well  Ambulation/Gait Ambulation/Gait assistance: Min assist Gait Distance (Feet): (March in place in front of her recliner) Assistive device: (UE support on window sill and RUE support form therapist)       General Gait Details: Short step height; reports is nervous about LEs "giving way"   Stairs         General stair comments: Pt discussed going up and down stairs with husband assist; Showed pt a video on how to use a shower chair to go up and down steps  Wheelchair Mobility    Modified Rankin (Stroke Patients Only)       Balance             Standing balance-Leahy Scale: Poor                               Pertinent Vitals/Pain Pain Assessment: Faces Faces Pain Scale: Hurts a little bit Pain Location: L hip/back from swelling Pain Descriptors / Indicators: Aching Pain  Intervention(s): Monitored during session    Home Living Family/patient expects to be discharged to:: Private residence Living Arrangements: Spouse/significant other;Other relatives Available Help at Discharge: Family Type of Home: House Home Access: Stairs to enter Entrance Stairs-Rails: Left Entrance Stairs-Number of Steps: 4 Home Layout: One level Home Equipment: Tub bench;Walker - 2 wheels;Wheelchair - manual;Cane - single point(suction grab bars in shower )      Prior Function Level of Independence:  Needs assistance   Gait / Transfers Assistance Needed: Reports 3-4 falls in recent months. Been using a w/c for community mobility  ADL's / Homemaking Assistance Needed: Has been completing ADL's in difficulty   Comments: Per chart review, pt has been very weak with almost no ambulation since Covid hospitalization, prior to that able to ambulate and do ADLs w/o assist or AD. States husband has been helping some with LB ADLs since COVID hospitalization     Hand Dominance   Dominant Hand: Right    Extremity/Trunk Assessment   Upper Extremity Assessment Upper Extremity Assessment: Defer to OT evaluation    Lower Extremity Assessment Lower Extremity Assessment: Generalized weakness(Difficulty with sit to stand due to weakness)       Communication   Communication: No difficulties  Cognition Arousal/Alertness: Awake/alert Behavior During Therapy: WFL for tasks assessed/performed;Flat affect Overall Cognitive Status: Within Functional Limits for tasks assessed                                        General Comments General comments (skin integrity, edema, etc.): VSS    Exercises     Assessment/Plan    PT Assessment Patient needs continued PT services  PT Problem List Decreased strength;Decreased balance;Decreased activity tolerance;Decreased mobility       PT Treatment Interventions DME instruction;Gait training;Functional mobility training;Therapeutic activities;Therapeutic exercise;Stair training;Balance training;Patient/family education    PT Goals (Current goals can be found in the Care Plan section)  Acute Rehab PT Goals Patient Stated Goal: to be able to stand up adn sit down with less assist; more confidence on stairs PT Goal Formulation: With patient Time For Goal Achievement: 08/11/19 Potential to Achieve Goals: Good    Frequency Min 3X/week   Barriers to discharge        Co-evaluation               AM-PAC PT "6 Clicks" Mobility   Outcome Measure Help needed turning from your back to your side while in a flat bed without using bedrails?: None Help needed moving from lying on your back to sitting on the side of a flat bed without using bedrails?: None Help needed moving to and from a bed to a chair (including a wheelchair)?: A Lot Help needed standing up from a chair using your arms (e.g., wheelchair or bedside chair)?: A Lot Help needed to walk in hospital room?: A Little Help needed climbing 3-5 steps with a railing? : A Lot 6 Click Score: 17    End of Session Equipment Utilized During Treatment: Gait belt Activity Tolerance: Patient limited by fatigue;Patient limited by pain Patient left: in chair;with call bell/phone within reach   PT Visit Diagnosis: Unsteadiness on feet (R26.81);History of falling (Z91.81);Muscle weakness (generalized) (M62.81);Difficulty in walking, not elsewhere classified (R26.2)    Time: 3419-3790 PT Time Calculation (min) (ACUTE ONLY): 26 min   Charges:   PT Evaluation $PT Eval Moderate Complexity: 1 Mod PT Treatments $Therapeutic Activity:  8-22 mins        Roney [place], [place]  Acute Rehabilitation Services Pager [phone] Office [phone]   Colletta [place] 07/28/2019, 1:14 PM

## 2019-07-28 NOTE — Progress Notes (Signed)
PROGRESS NOTE                                                                                                                                                                                                             Patient Demographics:    Janet Mitchell, is a 54 y.o. female, DOB - 10-27-1965, BSJ:[id number]  Admit date - 07/26/2019   Admitting Physician Albertine Patricia, MD  Outpatient Primary MD for the patient is Lucianne Lei, MD  LOS - 2  Chief Complaint  Patient presents with  . Abnormal Lab       Brief Narrative - Janet Mitchell  is a 54 y.o. female, with past medical history of end-stage renal disease on peritoneal hemodialysis since 2017, recently transition to hemodialysis 4/21, hypertension, diabetes mellitus type 2 , patient with rehospitalization at [facility] secondary to peritoneal dialysis catheter peritonitis, which was treated, then she was started on HD first session as an outpatient with this Monday, she presented to dialysis, her repeat CBC came back significant for drop in hemoglobin to 4.3, work-up at [facility], ER suggested that she had intra-abdominal bleed around the peritoneal dialysis catheter.  Was transferred to [facility] where general surgery and nephrology were called, she is receiving blood transfusions and being managed here.   Subjective:    Patient in bed, appears comfortable, denies any headache, no fever, no chest pain or pressure, no shortness of breath , no abdominal pain. No focal weakness.    Assessment  & Plan :     1.  Acute blood loss related acute on chronic anemia.  Patient appears to have intra-abdominal cyst with bleeding around the peritoneal catheter site, general surgery & nephrology following.  3 units of packed RBC on 07/27/2019 and 1 Unit 07/28/19 we will continue to monitor, do not think she has any active ongoing bleed.  2.  ESRD, now on HD.  Initially was on peritoneal diagnosis which was  complicated by peritonitis and now seems to have blood around the PD catheter.  Has a tunneled catheter in the Right IJ, nephrology following, continue HD per dialysis protocol.  3.Questionable changes on CT scan suggestive of possible undiagnosed ILD.  Outpatient follow-up with pulmonary once discharged.  4.  Morbid obesity BMI 41 follow with PCP for weight loss.  5.  GERD.  Continue PPI.  6.  Hypertension.  Placed on Norvasc along with as needed IV hydralazine and monitor.  7.  HX of chronic  diarrhea and migraine headaches.  Supportive care.    8. DM2 - ISS  Lab Results  Component Value Date   HGBA1C 7.2 (H) 07/08/2019   CBG (last 3)  Recent Labs    07/27/19 1148 07/27/19 1734 07/28/19 0715  GLUCAP 122* 139* 92     Family Communication  : Husband Barnett Abu [phone] on 07/27/2019, 07/28/2019 9:25 AM message left  Code Status : Full  Disposition Plan  : Stay inpatient  Status is: Inpatient  Remains inpatient appropriate because:Hemodynamically unstable   Dispo: The patient is from: Home              Anticipated d/c is to: Home              Anticipated d/c date is: > 3 days              Patient currently is not medically stable to d/c.  Consults  : General surgery, renal  Procedures  :    CT -  1. Interval enlargement of a large, loculated fluid collection in the ventral abdomen, measuring approximately 29.0 x 14.0 x 21.0 cm, now containing extensive internal heterogeneous attenuation. This appearance is most consistent with a pseudocyst in the setting of peritoneal dialysis and concerning for internal hemorrhage given report of anemia. 2. Extensive bilateral ground-glass and irregular interstitial opacity in the included bilateral lung bases, similar to prior examination and concerning for fibrotic interstitial lung disease although incompletely characterized. Consider dedicated ILD protocol imaging of the chest on a nonemergent basis. 3. Other chronic and  incidental findings as above. Aortic Atherosclerosis (ICD10-I70.0).   DVT Prophylaxis  :   SCDs    Lab Results  Component Value Date   PLT 203 07/27/2019    Diet :  Diet Order            DIET SOFT Room service appropriate? Yes; Fluid consistency: Thin  Diet effective now               Inpatient Medications Scheduled Meds: . amLODipine  10 mg Oral Daily  . [START ON 07/29/2019] calcitRIOL  0.5 mcg Oral Once per day on Mon Tue Wed Thu Fri  . calcitRIOL  1 mcg Oral Once per day on Sun Sat  . Chlorhexidine Gluconate Cloth  6 each Topical Daily  . diphenhydrAMINE      . insulin aspart  0-9 Units Subcutaneous Q6H  . multivitamin  1 tablet Oral QHS   Continuous Infusions: PRN Meds:.acetaminophen, albuterol, diphenhydrAMINE, hydrALAZINE, HYDROcodone-acetaminophen  Antibiotics  :   Anti-infectives (From admission, onward)   None          Objective:   Vitals:   07/28/19 0453 07/28/19 0530 07/28/19 0552 07/28/19 0800  BP: (!) 161/82  (!) 154/75 (!) 136/99  Pulse: 79 79 79 78  Resp: 17 20 (!) 21 20  Temp: 99.3 F (37.4 C) 98.4 F (36.9 C) 98.6 F (37 C) 98.3 F (36.8 C)  TempSrc: Oral Oral Oral Oral  SpO2: 96% 97% 97% (!) 88%  Weight:      Height:        SpO2: (!) 88 % O2 Flow Rate (L/min): 3 L/min  Wt Readings from Last 3 Encounters:  07/28/19 124.4 kg  07/22/19 125.4 kg  07/19/19 125.4 kg     Intake/Output Summary (Last 24 hours) at 07/28/2019 0922 Last data filed at 07/28/2019 0020 Gross per 24 hour  Intake 555 ml  Output 3000 ml  Net -2445 ml  Physical Exam  Awake Alert, No new F.N deficits, Normal affect [place].AT,PERRAL Supple Neck,No JVD, No cervical lymphadenopathy appriciated.  Symmetrical Chest wall movement, Good air movement bilaterally, CTAB RRR,No Gallops, Rubs or new Murmurs, No Parasternal Heave +ve B.Sounds, Abd Soft, No tenderness, No organomegaly appriciated, No rebound - guarding or rigidity.  PD catheter in place with some blood in  the tubing. No cyanosis or clubbing    Data Review:    Recent Labs  Lab 07/22/19 0818 07/26/19 1140 07/26/19 1407 07/27/19 0755 07/27/19 1906  WBC  --   --  9.5 8.2 10.4  HGB 9.2* 4.3* 4.7* 6.0* 6.6*  HCT 27.0* 14.6* 16.1* 19.5* 21.0*  PLT  --   --  227 209 203  MCV  --   --  81.3 83.0 79.5*  MCH  --   --  23.7* 25.5* 25.0*  MCHC  --   --  29.2* 30.8 31.4  RDW  --   --  23.1* 22.1* 20.8*  LYMPHSABS  --   --  1.7  --   --   MONOABS  --   --  1.3*  --   --   EOSABS  --   --  0.5  --   --   BASOSABS  --   --  0.1  --   --     Recent Labs  Lab 07/22/19 0818 07/26/19 1407 07/27/19 0755  NA 136 136 138  K 4.6 4.3 5.1  CL 101 98 100  CO2  --  27 27  GLUCOSE 139* 187* 143*  BUN 51* 34* 33*  CREATININE 9.60* 5.86* 6.49*  CALCIUM  --  8.5* 9.1  MG  --   --  1.8  CRP  --   --  6.6*  INR  --   --  1.3*  BNP  --   --  1,559.8*    Recent Labs  Lab 07/26/19 1900 07/27/19 0755  CRP  --  6.6*  BNP  --  1,559.8*  SARSCOV2NAA NEGATIVE  --     ------------------------------------------------------------------------------------------------------------------ No results for input(s): CHOL, HDL, LDLCALC, TRIG, CHOLHDL, LDLDIRECT in the last 72 hours.  Lab Results  Component Value Date   HGBA1C 7.2 (H) 07/08/2019   ------------------------------------------------------------------------------------------------------------------ No results for input(s): TSH, T4TOTAL, T3FREE, THYROIDAB in the last 72 hours.  Invalid input(s): FREET3 ------------------------------------------------------------------------------------------------------------------ No results for input(s): VITAMINB12, FOLATE, FERRITIN, TIBC, IRON, RETICCTPCT in the last 72 hours.  Coagulation profile Recent Labs  Lab 07/27/19 0755  INR 1.3*    No results for input(s): DDIMER in the last 72 hours.  Cardiac Enzymes No results for input(s): CKMB, TROPONINI, MYOGLOBIN in the last 168 hours.  Invalid  input(s): CK ------------------------------------------------------------------------------------------------------------------    Component Value Date/Time   BNP 1,559.8 (H) 07/27/2019 0755    Micro Results Recent Results (from the past 240 hour(s))  SARS CORONAVIRUS 2 (TAT 6-24 HRS) Nasopharyngeal Nasopharyngeal Swab     Status: None   Collection Time: 07/20/19 11:31 AM   Specimen: Nasopharyngeal Swab  Result Value Ref Range Status   SARS Coronavirus 2 NEGATIVE NEGATIVE Final    Comment: (NOTE) SARS-CoV-2 target nucleic acids are NOT DETECTED. The SARS-CoV-2 RNA is generally detectable in upper and lower respiratory specimens during the acute phase of infection. Negative results do not preclude SARS-CoV-2 infection, do not rule out co-infections with other pathogens, and should not be used as the sole basis for treatment or other patient management decisions. Negative results must be combined  with clinical observations, patient history, and epidemiological information. The expected result is Negative. Fact Sheet for Patients: SugarRoll.be Fact Sheet for Healthcare Providers: https://www.woods-mathews.com/ This test is not yet approved or cleared by the [place] FDA and  has been authorized for detection and/or diagnosis of SARS-CoV-2 by FDA under an Emergency Use Authorization (EUA). This EUA will remain  in effect (meaning this test can be used) for the duration of the COVID-19 declaration under Section 56 4(b)(1) of the Act, 21 U.S.C. section 360bbb-3(b)(1), unless the authorization is terminated or revoked sooner. Performed at [facility], [place] [address], [place], [place] [address]   Respiratory Panel by RT PCR (Flu A&B, Covid) - Nasopharyngeal Swab     Status: None   Collection Time: 07/26/19  7:00 PM   Specimen: Nasopharyngeal Swab  Result Value Ref Range Status   SARS Coronavirus 2 by RT PCR NEGATIVE NEGATIVE Final      Comment: (NOTE) SARS-CoV-2 target nucleic acids are NOT DETECTED. The SARS-CoV-2 RNA is generally detectable in upper respiratoy specimens during the acute phase of infection. The lowest concentration of SARS-CoV-2 viral copies this assay can detect is 131 copies/mL. A negative result does not preclude SARS-Cov-2 infection and should not be used as the sole basis for treatment or other patient management decisions. A negative result may occur with  improper specimen collection/handling, submission of specimen other than nasopharyngeal swab, presence of viral mutation(s) within the areas targeted by this assay, and inadequate number of viral copies (<131 copies/mL). A negative result must be combined with clinical observations, patient history, and epidemiological information. The expected result is Negative. Fact Sheet for Patients:  PinkCheek.be Fact Sheet for Healthcare Providers:  GravelBags.it This test is not yet ap proved or cleared by the [place] FDA and  has been authorized for detection and/or diagnosis of SARS-CoV-2 by FDA under an Emergency Use Authorization (EUA). This EUA will remain  in effect (meaning this test can be used) for the duration of the COVID-19 declaration under Section 564(b)(1) of the Act, 21 U.S.C. section 360bbb-3(b)(1), unless the authorization is terminated or revoked sooner.    Influenza A by PCR NEGATIVE NEGATIVE Final   Influenza B by PCR NEGATIVE NEGATIVE Final    Comment: (NOTE) The Xpert Xpress SARS-CoV-2/FLU/RSV assay is intended as an aid in  the diagnosis of influenza from Nasopharyngeal swab specimens and  should not be used as a sole basis for treatment. Nasal washings and  aspirates are unacceptable for Xpert Xpress SARS-CoV-2/FLU/RSV  testing. Fact Sheet for Patients: PinkCheek.be Fact Sheet for Healthcare  Providers: GravelBags.it This test is not yet approved or cleared by the [place] FDA and  has been authorized for detection and/or diagnosis of SARS-CoV-2 by  FDA under an Emergency Use Authorization (EUA). This EUA will remain  in effect (meaning this test can be used) for the duration of the  Covid-19 declaration under Section 564(b)(1) of the Act, 21  U.S.C. section 360bbb-3(b)(1), unless the authorization is  terminated or revoked. Performed at [facility], [address], [place], [place] [address]     Radiology Reports CT ABDOMEN PELVIS WO CONTRAST  Result Date: 07/09/2019 CLINICAL DATA:  Abdomen pain EXAM: CT ABDOMEN AND PELVIS WITHOUT CONTRAST TECHNIQUE: Multidetector CT imaging of the abdomen and pelvis was performed following the standard protocol without IV contrast. COMPARISON:  05/27/2019, 04/08/2019 FINDINGS: Lower chest: Lung bases demonstrate heterogeneous mostly peripheral opacities likely scarring from prior COVID pneumonia. No consolidation or pleural effusion. Coronary  vascular calcification. Mild cardiomegaly. Hepatobiliary: No focal liver abnormality is seen. Status post cholecystectomy. No biliary dilatation. Pancreas: Unremarkable. No pancreatic ductal dilatation or surrounding inflammatory changes. Spleen: Normal in size without focal abnormality. Adrenals/Urinary Tract: Adrenal glands are slightly thickened but without dominant mass. Atrophic kidneys with extensive vascular calcification. No hydronephrosis. Unremarkable urinary bladder Stomach/Bowel: Stomach is within normal limits. Appendix appears normal. No evidence of bowel wall thickening, distention, or inflammatory changes. Vascular/Lymphatic: Extensive aortic atherosclerosis. No aneurysm. No suspicious adenopathy Reproductive: IUD in the uterus Other: No free air. Moderate to large loculated ascites within the anterior abdominal cavity. Peritoneal dialysis catheter coiled within the  right anterior abdomen. Musculoskeletal: No acute or suspicious osseous abnormality. IMPRESSION: 1. Moderate to large volume of loculated appearing ascites within the anterior abdominal cavity 2. Heterogenous peripheral densities at the bases felt secondary to prior COVID pneumonia and scarring 3. Cardiomegaly.  Anasarca.  Atrophic native kidneys Electronically Signed   By: Donavan Foil M.D.   On: 07/09/2019 03:22   CT ABDOMEN PELVIS W CONTRAST  Result Date: 07/26/2019 CLINICAL DATA:  Anemia, dialysis EXAM: CT ABDOMEN AND PELVIS WITH CONTRAST TECHNIQUE: Multidetector CT imaging of the abdomen and pelvis was performed using the standard protocol following bolus administration of intravenous contrast. CONTRAST:  110mL OMNIPAQUE IOHEXOL 300 MG/ML  SOLN COMPARISON:  07/09/2019, 05/27/2019 FINDINGS: Lower chest: There is extensive bilateral ground-glass and irregular interstitial opacity in the included lung bases. Cardiomegaly. Coronary artery calcifications. Hepatobiliary: No focal liver abnormality is seen. Status post cholecystectomy. No biliary dilatation. Pancreas: Unremarkable. No pancreatic ductal dilatation or surrounding inflammatory changes. Spleen: Normal in size without significant abnormality. Adrenals/Urinary Tract: Adrenal glands are unremarkable. Atrophic appearing kidneys. No hydronephrosis. Bladder is unremarkable. Stomach/Bowel: Stomach is within normal limits. Appendix appears normal. No evidence of bowel wall thickening, distention, or inflammatory changes. Vascular/Lymphatic: Aortic atherosclerosis. Extensive vascular calcinosis. No enlarged abdominal or pelvic lymph nodes. Reproductive: No mass or other significant abnormality. Other: Anasarca. Interval enlargement of a large, loculated fluid collection in the ventral abdomen, measuring approximately 29.0 x 14.0 x 21.0 cm, now containing extensive internal heterogeneous attenuation. There is a tunneled Tenckhoff type peritoneal dialysis  catheter within this collection. Musculoskeletal: Plate and screw fixation of the left acetabulum. IMPRESSION: 1. Interval enlargement of a large, loculated fluid collection in the ventral abdomen, measuring approximately 29.0 x 14.0 x 21.0 cm, now containing extensive internal heterogeneous attenuation. This appearance is most consistent with a pseudocyst in the setting of peritoneal dialysis and concerning for internal hemorrhage given report of anemia. 2. Extensive bilateral ground-glass and irregular interstitial opacity in the included bilateral lung bases, similar to prior examination and concerning for fibrotic interstitial lung disease although incompletely characterized. Consider dedicated ILD protocol imaging of the chest on a nonemergent basis. 3. Other chronic and incidental findings as above. Aortic Atherosclerosis (ICD10-I70.0). Electronically Signed   By: Eddie Candle M.D.   On: 07/26/2019 16:41   IR Fluoro Guide CV Line Right  Result Date: 07/17/2019 CLINICAL DATA:  End-stage renal disease. Recent removal of peritoneal dialysis catheter. Hemodialysis access required EXAM: TUNNELED HEMODIALYSIS CATHETER PLACEMENT WITH ULTRASOUND AND FLUOROSCOPIC GUIDANCE TECHNIQUE: The procedure, risks, benefits, and alternatives were explained to the patient. Questions regarding the procedure were encouraged and answered. The patient understands and consents to the procedure. As antibiotic prophylaxis, cefazolin 3 g was ordered pre-procedure and administered intravenously within one hour of incision.Patency of the right IJ vein was confirmed with ultrasound with image documentation. An appropriate skin site was determined. Region  was prepped using maximum barrier technique including cap and mask, sterile gown, sterile gloves, large sterile sheet, and Chlorhexidine as cutaneous antisepsis. The region was infiltrated locally with 1% lidocaine. Intravenous Fentanyl 29mcg and Versed 1mg  were administered as conscious  sedation during continuous monitoring of the patient's level of consciousness and physiological / cardiorespiratory status by the radiology RN, with a total moderate sedation time of 15 minutes. Under real-time ultrasound guidance, the right IJ vein was accessed with a 21 gauge micropuncture needle; the needle tip within the vein was confirmed with ultrasound image documentation. Needle exchanged over the 018 guidewire for transitional dilator, which allowed advancement of a Benson wire into the IVC. Over this, an MPA catheter was advanced. A Palindrome 19 hemodialysis catheter was tunneled from the right anterior chest wall approach to the right IJ dermatotomy site. The MPA catheter was exchanged over an Amplatz wire for serial vascular dilators which allow placement of a peel-away sheath, through which the catheter was advanced under intermittent fluoroscopy, positioned with its tips in the proximal and midright atrium. Spot chest radiograph confirms good catheter position. No pneumothorax. Catheter was flushed and primed per protocol. Catheter secured externally with O Prolene sutures. The right IJ dermatotomy site was closed with Dermabond. COMPLICATIONS: COMPLICATIONS None immediate FLUOROSCOPY TIME:  0.5 minutes; 70 uGym2 DAP COMPARISON:  None IMPRESSION: 1. Technically successful placement of tunneled right IJ hemodialysis catheter with ultrasound and fluoroscopic guidance. Ready for routine use. ACCESS: Remains approachable for percutaneous intervention as needed. Electronically Signed   By: Lucrezia Europe M.D.   On: 07/17/2019 15:43   IR US Guide Vasc Access Right  Result Date: 07/17/2019 CLINICAL DATA:  End-stage renal disease. Recent removal of peritoneal dialysis catheter. Hemodialysis access required EXAM: TUNNELED HEMODIALYSIS CATHETER PLACEMENT WITH ULTRASOUND AND FLUOROSCOPIC GUIDANCE TECHNIQUE: The procedure, risks, benefits, and alternatives were explained to the patient. Questions regarding the  procedure were encouraged and answered. The patient understands and consents to the procedure. As antibiotic prophylaxis, cefazolin 3 g was ordered pre-procedure and administered intravenously within one hour of incision.Patency of the right IJ vein was confirmed with ultrasound with image documentation. An appropriate skin site was determined. Region was prepped using maximum barrier technique including cap and mask, sterile gown, sterile gloves, large sterile sheet, and Chlorhexidine as cutaneous antisepsis. The region was infiltrated locally with 1% lidocaine. Intravenous Fentanyl 57mcg and Versed 1mg  were administered as conscious sedation during continuous monitoring of the patient's level of consciousness and physiological / cardiorespiratory status by the radiology RN, with a total moderate sedation time of 15 minutes. Under real-time ultrasound guidance, the right IJ vein was accessed with a 21 gauge micropuncture needle; the needle tip within the vein was confirmed with ultrasound image documentation. Needle exchanged over the 018 guidewire for transitional dilator, which allowed advancement of a Benson wire into the IVC. Over this, an MPA catheter was advanced. A Palindrome 19 hemodialysis catheter was tunneled from the right anterior chest wall approach to the right IJ dermatotomy site. The MPA catheter was exchanged over an Amplatz wire for serial vascular dilators which allow placement of a peel-away sheath, through which the catheter was advanced under intermittent fluoroscopy, positioned with its tips in the proximal and midright atrium. Spot chest radiograph confirms good catheter position. No pneumothorax. Catheter was flushed and primed per protocol. Catheter secured externally with O Prolene sutures. The right IJ dermatotomy site was closed with Dermabond. COMPLICATIONS: COMPLICATIONS None immediate FLUOROSCOPY TIME:  0.5 minutes; 70 uGym2 DAP COMPARISON:  None IMPRESSION: 1. Technically  successful placement of tunneled right IJ hemodialysis catheter with ultrasound and fluoroscopic guidance. Ready for routine use. ACCESS: Remains approachable for percutaneous intervention as needed. Electronically Signed   By: Lucrezia Europe M.D.   On: 07/17/2019 15:43   DG Chest Port 1 View  Result Date: 07/09/2019 CLINICAL DATA:  Short of breath EXAM: PORTABLE CHEST 1 VIEW COMPARISON:  04/02/2019 FINDINGS: Low lung volumes. Cardiomegaly. Diffuse bilateral reticular and ground-glass opacity. No pleural effusion or pneumothorax. IMPRESSION: Cardiomegaly with mild diffuse fine reticular and ground-glass opacity similar compared to prior, question underlying chronic interstitial lung disease. Electronically Signed   By: Donavan Foil M.D.   On: 07/09/2019 00:44   ECHOCARDIOGRAM COMPLETE  Result Date: 07/09/2019    ECHOCARDIOGRAM REPORT   Patient Name:   [facility] Date of Exam: 07/09/2019 Medical Rec #:  [id number]          Height:       68.0 in Accession #:    [id number]         Weight:       250.7 lb Date of Birth:  07-Aug-1965           BSA:          2.250 m Patient Age:    67 years           BP:           161/73 mmHg Patient Gender: F                  HR:           76 bpm. Exam Location:  Inpatient Procedure: 2D Echo, Cardiac Doppler and Color Doppler Indications:    I50.23 Acute on chronic systolic (congestive) heart failure  History:        Patient has no prior history of Echocardiogram examinations.                 Risk Factors:Hypertension and Diabetes. ESRD. COVID-19.  Sonographer:    Jonelle Sidle Dance Referring Phys: [id number] [place]  1. Left ventricular ejection fraction, by estimation, is 50 to 55%. The left ventricle has normal function. The left ventricle demonstrates regional wall motion abnormalities (see scoring diagram/findings for description). There is moderate concentric left ventricular hypertrophy. Left ventricular diastolic parameters are consistent with Grade II  diastolic dysfunction (pseudonormalization). Elevated left atrial pressure. There is mild hypokinesis of the left ventricular, entire inferolateral wall.  2. Right ventricular systolic function is mildly reduced. The right ventricular size is normal. There is moderately elevated pulmonary artery systolic pressure.  3. Left atrial size was moderately dilated.  4. Right atrial size was mildly dilated.  5. The mitral valve is normal in structure. Trivial mitral valve regurgitation.  6. Tricuspid valve regurgitation is mild to moderate.  7. The aortic valve is normal in structure. Aortic valve regurgitation is trivial.  8. The inferior vena cava is dilated in size with >50% respiratory variability, suggesting right atrial pressure of 8 mmHg. FINDINGS  Left Ventricle: Left ventricular ejection fraction, by estimation, is 50 to 55%. The left ventricle has normal function. The left ventricle demonstrates regional wall motion abnormalities. Mild hypokinesis of the left ventricular, entire inferolateral wall. The left ventricular internal cavity size was normal in size. There is moderate concentric left ventricular hypertrophy. Left ventricular diastolic parameters are consistent with Grade II diastolic dysfunction (pseudonormalization). Elevated left atrial pressure. Right Ventricle: The right ventricular size is normal. No increase  in right ventricular wall thickness. Right ventricular systolic function is mildly reduced. There is moderately elevated pulmonary artery systolic pressure. The tricuspid regurgitant velocity is 2.91 m/s, and with an assumed right atrial pressure of 8 mmHg, the estimated right ventricular systolic pressure is 97.3 mmHg. Left Atrium: Left atrial size was moderately dilated. Right Atrium: Right atrial size was mildly dilated. Pericardium: There is no evidence of pericardial effusion. Mitral Valve: The mitral valve is normal in structure. Mild mitral annular calcification. Trivial mitral valve  regurgitation. Tricuspid Valve: The tricuspid valve is normal in structure. Tricuspid valve regurgitation is mild to moderate. Aortic Valve: The aortic valve is normal in structure. Aortic valve regurgitation is trivial. Pulmonic Valve: The pulmonic valve was normal in structure. Pulmonic valve regurgitation is not visualized. Aorta: The aortic root and ascending aorta are structurally normal, with no evidence of dilitation. Venous: The inferior vena cava is dilated in size with greater than 50% respiratory variability, suggesting right atrial pressure of 8 mmHg. IAS/Shunts: No atrial level shunt detected by color flow Doppler.  LEFT VENTRICLE PLAX 2D LVIDd:         5.30 cm  Diastology LVIDs:         3.70 cm  LV e' lateral:   8.05 cm/s LV PW:         1.50 cm  LV E/e' lateral: 16.8 LV IVS:        1.30 cm  LV e' medial:    5.22 cm/s LVOT diam:     1.90 cm  LV E/e' medial:  25.9 LV SV:         57 LV SV Index:   25 LVOT Area:     2.84 cm  RIGHT VENTRICLE            IVC RV Basal diam:  3.50 cm    IVC diam: 2.20 cm RV Mid diam:    2.10 cm RV S prime:     9.03 cm/s TAPSE (M-mode): 2.0 cm LEFT ATRIUM              Index       RIGHT ATRIUM           Index LA diam:        4.80 cm  2.13 cm/m  RA Area:     19.00 cm LA Vol (A2C):   107.0 ml 47.56 ml/m RA Volume:   54.50 ml  24.23 ml/m LA Vol (A4C):   77.4 ml  34.40 ml/m LA Biplane Vol: 94.9 ml  42.18 ml/m  AORTIC VALVE LVOT Vmax:   83.00 cm/s LVOT Vmean:  61.400 cm/s LVOT VTI:    0.200 m  AORTA Ao Root diam: 3.10 cm Ao Asc diam:  3.15 cm MITRAL VALVE                TRICUSPID VALVE MV Area (PHT): 4.21 cm     TR Peak grad:   33.9 mmHg MV Decel Time: 180 msec     TR Vmax:        291.00 cm/s MV E velocity: 135.00 cm/s MV A velocity: 79.60 cm/s   SHUNTS MV E/A ratio:  1.70         Systemic VTI:  0.20 m                             Systemic Diam: 1.90 cm Dani Gobble Croitoru MD Electronically signed by Sanda Klein MD Signature Date/Time: 07/09/2019/1:54:09  PM    Final    VAS [place]  UPPER EXT VEIN MAPPING (PRE-OP AVF)  Result Date: 07/18/2019 UPPER EXTREMITY VEIN MAPPING  Indications: Pre-access. Comparison Study: No prior studies. Performing Technologist: Oliver Hum RVT  Examination Guidelines: A complete evaluation includes B-mode imaging, spectral Doppler, color Doppler, and power Doppler as needed of all accessible portions of each vessel. Bilateral testing is considered an integral part of a complete examination. Limited examinations for reoccurring indications may be performed as noted. +-----------------+-------------+----------+--------------+ Right Cephalic   Diameter (cm)Depth (cm)   Findings    +-----------------+-------------+----------+--------------+ Shoulder             0.32        1.30                  +-----------------+-------------+----------+--------------+ Prox upper arm       0.52        0.75     branching    +-----------------+-------------+----------+--------------+ Mid upper arm        0.48        0.93                  +-----------------+-------------+----------+--------------+ Dist upper arm       0.36        0.49     branching    +-----------------+-------------+----------+--------------+ Antecubital fossa    0.52        0.23      Thrombus    +-----------------+-------------+----------+--------------+ Prox forearm         0.29        0.75     branching    +-----------------+-------------+----------+--------------+ Mid forearm          0.31        0.49                  +-----------------+-------------+----------+--------------+ Dist forearm                            not visualized +-----------------+-------------+----------+--------------+ +-----------------+-------------+----------+---------+ Right Basilic    Diameter (cm)Depth (cm)Findings  +-----------------+-------------+----------+---------+ Shoulder             0.58        1.30             +-----------------+-------------+----------+---------+ Mid  upper arm        0.40        1.50             +-----------------+-------------+----------+---------+ Dist upper arm       0.52        1.10             +-----------------+-------------+----------+---------+ Antecubital fossa    0.27        0.74   branching +-----------------+-------------+----------+---------+ Prox forearm         0.23        0.43             +-----------------+-------------+----------+---------+ Mid forearm          0.26        0.17             +-----------------+-------------+----------+---------+ Distal forearm       0.29        0.14             +-----------------+-------------+----------+---------+ +-----------------+-------------+----------+----------------------+ Left Cephalic    Diameter (cm)Depth (cm)       Findings        +-----------------+-------------+----------+----------------------+ Shoulder  0.33        0.87                          +-----------------+-------------+----------+----------------------+ Prox upper arm       0.26        1.30         branching        +-----------------+-------------+----------+----------------------+ Mid upper arm        0.24        0.98                          +-----------------+-------------+----------+----------------------+ Dist upper arm       0.21        0.60         branching        +-----------------+-------------+----------+----------------------+ Antecubital fossa    0.32        0.28                          +-----------------+-------------+----------+----------------------+ Prox forearm         0.49        0.84   branching and Thrombus +-----------------+-------------+----------+----------------------+ Mid forearm                                 not visualized     +-----------------+-------------+----------+----------------------+ Dist forearm         0.21        0.31                          +-----------------+-------------+----------+----------------------+  +-----------------+-------------+----------+---------+ Left Basilic     Diameter (cm)Depth (cm)Findings  +-----------------+-------------+----------+---------+ Shoulder             0.68        1.50             +-----------------+-------------+----------+---------+ Mid upper arm        0.46        1.40             +-----------------+-------------+----------+---------+ Dist upper arm       0.57        1.20             +-----------------+-------------+----------+---------+ Antecubital fossa    0.43        0.68   branching +-----------------+-------------+----------+---------+ Prox forearm         0.41        0.55   branching +-----------------+-------------+----------+---------+ Mid forearm          0.33        0.60   branching +-----------------+-------------+----------+---------+ Distal forearm       0.24        0.44             +-----------------+-------------+----------+---------+ *See table(s) above for measurements and observations.  Diagnosing physician: Monica Martinez MD Electronically signed by Monica Martinez MD on 07/18/2019 at 8:13:03 PM.    Final    IR Paracentesis  Result Date: 07/09/2019 INDICATION: Patient with history of end stage renal disease admitted with abdominal pain and ascites presents for diagnostic paracentesis maximum 100 ml per Team's request. EXAM: ULTRASOUND GUIDED DIAGNOSTIC PARACENTESIS MEDICATIONS: Lidocaine 1% 10 mL COMPLICATIONS: None immediate. PROCEDURE: Informed written consent was obtained from the patient after a discussion of the risks, benefits and alternatives  to treatment. A timeout was performed prior to the initiation of the procedure. Initial ultrasound scanning demonstrates a large amount of ascites within the right lower abdominal quadrant. The right lower abdomen was prepped and draped in the usual sterile fashion. 1% lidocaine was used for local anesthesia. Following this, a 19 gauge, 10-cm, Yueh catheter was introduced.  An ultrasound image was saved for documentation purposes. The paracentesis was performed. The catheter was removed and a dressing was applied. The patient tolerated the procedure well without immediate post procedural complication. FINDINGS: A total of approximately 100 m of clear fluid was removed. Samples were sent to the laboratory as requested by the clinical team. IMPRESSION: Successful ultrasound-guided therapeutic paracentesis yielding 100 mililiters of peritoneal fluid. Read by Rushie Nyhan NP Electronically Signed   By: Markus Daft M.D.   On: 07/09/2019 09:12    Time Spent in minutes  30   Lala Lund M.D on 07/28/2019 at 9:22 AM  To page go to [email or web address] - password [email or web address]

## 2019-07-29 LAB — CBC WITH DIFFERENTIAL/PLATELET
Abs Immature Granulocytes: 0.05 10*3/uL (ref 0.00–0.07)
Basophils Absolute: 0.1 10*3/uL (ref 0.0–0.1)
Basophils Relative: 1 %
Eosinophils Absolute: 0.4 10*3/uL (ref 0.0–0.5)
Eosinophils Relative: 4 %
HCT: 26.8 % — ABNORMAL LOW (ref 36.0–46.0)
Hemoglobin: 8.5 g/dL — ABNORMAL LOW (ref 12.0–15.0)
Immature Granulocytes: 0 %
Lymphocytes Relative: 11 %
Lymphs Abs: 1.3 10*3/uL (ref 0.7–4.0)
MCH: 26.3 pg (ref 26.0–34.0)
MCHC: 31.7 g/dL (ref 30.0–36.0)
MCV: 83 fL (ref 80.0–100.0)
Monocytes Absolute: 1.4 10*3/uL — ABNORMAL HIGH (ref 0.1–1.0)
Monocytes Relative: 12 %
Neutro Abs: 8.2 10*3/uL — ABNORMAL HIGH (ref 1.7–7.7)
Neutrophils Relative %: 72 %
Platelets: 202 10*3/uL (ref 150–400)
RBC: 3.23 MIL/uL — ABNORMAL LOW (ref 3.87–5.11)
RDW: 21.1 % — ABNORMAL HIGH (ref 11.5–15.5)
WBC: 11.4 10*3/uL — ABNORMAL HIGH (ref 4.0–10.5)
nRBC: 0 % (ref 0.0–0.2)

## 2019-07-29 LAB — COMPREHENSIVE METABOLIC PANEL
ALT: <5 U/L (ref 0–44)
AST: 13 U/L — ABNORMAL LOW (ref 15–41)
Albumin: 2.7 g/dL — ABNORMAL LOW (ref 3.5–5.0)
Alkaline Phosphatase: 70 U/L (ref 38–126)
Anion gap: 12 (ref 5–15)
BUN: 26 mg/dL — ABNORMAL HIGH (ref 6–20)
CO2: 28 mmol/L (ref 22–32)
Calcium: 9 mg/dL (ref 8.9–10.3)
Chloride: 96 mmol/L — ABNORMAL LOW (ref 98–111)
Creatinine, Ser: 5.77 mg/dL — ABNORMAL HIGH (ref 0.44–1.00)
GFR calc Af Amer: 9 mL/min — ABNORMAL LOW (ref >60)
GFR calc non Af Amer: 8 mL/min — ABNORMAL LOW (ref >60)
Glucose, Bld: 139 mg/dL — ABNORMAL HIGH (ref 70–99)
Potassium: 4.5 mmol/L (ref 3.5–5.1)
Sodium: 136 mmol/L (ref 135–145)
Total Bilirubin: 0.7 mg/dL (ref 0.3–1.2)
Total Protein: 7.5 g/dL (ref 6.5–8.1)

## 2019-07-29 LAB — GLUCOSE, CAPILLARY
Glucose-Capillary: 125 mg/dL — ABNORMAL HIGH (ref 70–99)
Glucose-Capillary: 87 mg/dL (ref 70–99)
Glucose-Capillary: 91 mg/dL (ref 70–99)
Glucose-Capillary: 128 mg/dL — ABNORMAL HIGH (ref 70–99)

## 2019-07-29 LAB — BRAIN NATRIURETIC PEPTIDE: B Natriuretic Peptide: 3064.4 pg/mL — ABNORMAL HIGH (ref 0.0–100.0)

## 2019-07-29 LAB — MAGNESIUM: Magnesium: 1.8 mg/dL (ref 1.7–2.4)

## 2019-07-29 MED ORDER — LIDOCAINE-PRILOCAINE 2.5-2.5 % EX CREA: 1.0000 "application " | TOPICAL_CREAM | CUTANEOUS | Status: DC | PRN

## 2019-07-29 MED ORDER — PENTAFLUOROPROP-TETRAFLUOROETH EX AERO: 1.0000 "application " | INHALATION_SPRAY | CUTANEOUS | Status: DC | PRN

## 2019-07-29 MED ORDER — HEPARIN SODIUM (PORCINE) 1000 UNIT/ML DIALYSIS
1000.0000 [IU] | INTRAMUSCULAR | Status: DC | PRN
  Administered 2019-07-29: 1000 [IU] via INTRAVENOUS_CENTRAL

## 2019-07-29 MED ORDER — ALTEPLASE 2 MG IJ SOLR: 2.0000 mg | Freq: Once | INTRAMUSCULAR | Status: DC | PRN

## 2019-07-29 MED ORDER — SODIUM CHLORIDE 0.9 % IV SOLN: 100.0000 mL | INTRAVENOUS | Status: DC | PRN

## 2019-07-29 MED ORDER — AMLODIPINE BESYLATE 10 MG PO TABS
10.0000 mg | ORAL_TABLET | Freq: Every day | ORAL | Status: DC
  Filled 2019-07-29 (×2): qty 1

## 2019-07-29 MED ORDER — HYDRALAZINE HCL 25 MG PO TABS
25.0000 mg | ORAL_TABLET | Freq: Three times a day (TID) | ORAL | Status: DC
  Administered 2019-07-29 – 2019-07-30 (×3): 25 mg via ORAL
  Filled 2019-07-29 (×3): qty 1

## 2019-07-29 MED ORDER — LIDOCAINE HCL (PF) 1 % IJ SOLN
5.0000 mL | INTRAMUSCULAR | Status: DC | PRN
  Filled 2019-07-29: qty 5

## 2019-07-29 MED ORDER — CHLORHEXIDINE GLUCONATE CLOTH 2 % EX PADS
6.0000 | MEDICATED_PAD | Freq: Every day | CUTANEOUS | Status: DC
  Administered 2019-07-30: 06:00:00 6 via TOPICAL

## 2019-07-29 MED ORDER — HEPARIN SODIUM (PORCINE) 1000 UNIT/ML IJ SOLN
INTRAMUSCULAR | Status: AC
  Administered 2019-07-29: 4000 [IU]
  Filled 2019-07-29: qty 4

## 2019-07-29 MED ORDER — CALCITRIOL 0.5 MCG PO CAPS
ORAL_CAPSULE | ORAL | Status: AC
  Filled 2019-07-29: qty 1

## 2019-07-29 NOTE — Progress Notes (Signed)
OT Cancellation Note  Patient Details Name: Janet Mitchell MRN: [id number] DOB: [birth year]   Cancelled Treatment:    Reason Eval/Treat Not Completed: Patient at procedure or test/ unavailable. Pt currently in dialysis.  [name], OTR/L Acute Rehab Services Pager [phone] Office [phone]      [name] 07/29/2019, 7:39 AM

## 2019-07-29 NOTE — Progress Notes (Signed)
       Subjective: CC: Seen in HD. Doing well. No abdominal pain, n/v. Tolerating solid diet. Passing flatus. BM yesterday that was non-bloody.   Objective: Vital signs in last 24 hours: Temp:  [97.6 F (36.4 C)-99 F (37.2 C)] 98.8 F (37.1 C) (05/03 0700) Pulse Rate:  [77-83] 80 (05/03 1030) Resp:  [16-22] 16 (05/03 0800) BP: (156-178)/(67-96) 160/79 (05/03 1030) SpO2:  [97 %-100 %] 100 % (05/03 0700) Weight:  [125.3 kg] 125.3 kg (05/03 0700) Last BM Date: 07/27/19  Intake/Output from previous day: 05/02 0701 - 05/03 0700 In: 1080 [P.O.:720; Blood:360] Out: -  Intake/Output this shift: No intake/output data recorded.  PE: Gen:  Alert, NAD, pleasant Pulm: Normal rate and effort  ZMO:QHUTM, soft,ND, few BS heard, NT Psych: A&Ox4  Skin: no rashes noted, warm and dry  Lab Results:  Recent Labs    07/28/19 2306 07/29/19 0224  WBC 12.4* 11.4*  HGB 9.0* 8.5*  HCT 28.4* 26.8*  PLT 219 202   BMET Recent Labs    07/28/19 1113 07/29/19 0224  NA 136 136  K 4.4 4.5  CL 97* 96*  CO2 27 28  GLUCOSE 117* 139*  BUN 19 26*  CREATININE 4.83* 5.77*  CALCIUM 8.9 9.0   PT/INR Recent Labs    07/27/19 0755  LABPROT 15.2  INR 1.3*   CMP     Component Value Date/Time   NA 136 07/29/2019 0224   K 4.5 07/29/2019 0224   CL 96 (L) 07/29/2019 0224   CO2 28 07/29/2019 0224   GLUCOSE 139 (H) 07/29/2019 0224   BUN 26 (H) 07/29/2019 0224   CREATININE 5.77 (H) 07/29/2019 0224   CALCIUM 9.0 07/29/2019 0224   PROT 7.5 07/29/2019 0224   ALBUMIN 2.7 (L) 07/29/2019 0224   AST 13 (L) 07/29/2019 0224   ALT <5 07/29/2019 0224   ALKPHOS 70 07/29/2019 0224   BILITOT 0.7 07/29/2019 0224   GFRNONAA 8 (L) 07/29/2019 0224   GFRAA 9 (L) 07/29/2019 0224   Lipase     Component Value Date/Time   LIPASE 19 12/18/2018 0037       Studies/Results: No results found.  Anti-infectives: Anti-infectives (From admission, onward)   None        Assessment/Plan HTN HLD IDDM ESRD on HD from PD since 07/17/19 Obesity Interstitial lung disease, recent covid 02/2019 GERD  Intraabdominal fluid collection concerning for pseudocyst with internal hemorrhage Acute blood loss anemia - s/p 3 units PRBCs 5/1 and 1 unit 5/2 - Hgb stable at between 8.5-9.0 - hemodynamically stable and abdomen nontender - no role for surgical intervention, continue transfusion PRN primary  ID -none VTE -SCDs only FEN -HH/CM diet Foley -none Follow up -TBD   LOS: 3 days    Jillyn Ledger , [facility] 07/29/2019, 11:37 AM Please see Amion for pager number during day hours 7:00am-4:30pm

## 2019-07-29 NOTE — Progress Notes (Signed)
PROGRESS NOTE                                                                                                                                                                                                             Patient Demographics:    Janet Mitchell, is a 54 y.o. female, DOB - 10-04-1965, CNO:[id number]  Admit date - 07/26/2019   Admitting Physician Albertine Patricia, MD  Outpatient Primary MD for the patient is Lucianne Lei, MD  LOS - 3  Chief Complaint  Patient presents with  . Abnormal Lab       Brief Narrative - Janet Mitchell  is a 54 y.o. female, with past medical history of end-stage renal disease on peritoneal hemodialysis since 2017, recently transition to hemodialysis 4/21, hypertension, diabetes mellitus type 2 , patient with rehospitalization at [facility] secondary to peritoneal dialysis catheter peritonitis, which was treated, then she was started on HD first session as an outpatient with this Monday, she presented to dialysis, her repeat CBC came back significant for drop in hemoglobin to 4.3, work-up at [facility], ER suggested that she had intra-abdominal bleed around the peritoneal dialysis catheter.  Was transferred to [facility] ([place]) where general surgery and nephrology were called, she is receiving blood transfusions and being managed here.   Subjective:   Patient in bed, appears comfortable, denies any headache, no fever, no chest pain or pressure, no shortness of breath , no abdominal pain. No focal weakness.   Assessment  & Plan :     1.  Acute blood loss related acute on chronic anemia.  Patient appears to have intra-abdominal cyst with bleeding around the peritoneal catheter site, general surgery & nephrology following.  Total 4 units of packed RBC given this admission last packed RBC afternoon of 07/28/2019, currently H&H seems to be stable when accounted for heme dilution, we will continue to monitor, do not think she has any  active ongoing bleed.  2.  ESRD, now on HD.  Initially was on peritoneal diagnosis which was complicated by peritonitis and now seems to have blood around the PD catheter.  Has a tunneled catheter in the Right IJ, nephrology following, continue HD per dialysis protocol.  3. Questionable changes on CT scan suggestive of possible ILD.  Outpatient follow-up with pulmonary once discharged.  4.  Morbid obesity BMI 41 follow with PCP for weight loss.  5.  GERD.  Continue PPI.  6.  Hypertension.  Placed on Norvasc along  with as needed IV hydralazine and monitor.  7.  HX of chronic diarrhea and migraine headaches.  Supportive care.    8. DM2 - ISS  Lab Results  Component Value Date   HGBA1C 7.2 (H) 07/08/2019   CBG (last 3)  Recent Labs    07/28/19 1623 07/28/19 2125 07/29/19 0536  GLUCAP 113* 188* 125*     Family Communication  : Husband Barnett Abu [phone] on 07/27/2019, 07/28/2019 9:25 AM message left, 07/29/2019 updated at 10:25 AM  Code Status : Full  Disposition Plan  : Stay inpatient  Status is: Inpatient  Remains inpatient appropriate because:Hemodynamically unstable   Dispo: The patient is from: Home              Anticipated d/c is to: Home              Anticipated d/c date is: > 3 days              Patient currently is not medically stable to d/c.  Consults  : General surgery, renal  Procedures  :    CT -  1. Interval enlargement of a large, loculated fluid collection in the ventral abdomen, measuring approximately 29.0 x 14.0 x 21.0 cm, now containing extensive internal heterogeneous attenuation. This appearance is most consistent with a pseudocyst in the setting of peritoneal dialysis and concerning for internal hemorrhage given report of anemia. 2. Extensive bilateral ground-glass and irregular interstitial opacity in the included bilateral lung bases, similar to prior examination and concerning for fibrotic interstitial lung disease although incompletely  characterized. Consider dedicated ILD protocol imaging of the chest on a nonemergent basis. 3. Other chronic and incidental findings as above. Aortic Atherosclerosis (ICD10-I70.0).   DVT Prophylaxis  :   SCDs    Lab Results  Component Value Date   PLT 202 07/29/2019    Diet :  Diet Order            Diet renal/carb modified with fluid restriction Diet-HS Snack? Nothing; Fluid restriction: 1200 mL Fluid; Room service appropriate? Yes; Fluid consistency: Thin  Diet effective now               Inpatient Medications Scheduled Meds: . amLODipine  10 mg Oral Daily  . calcitRIOL  0.5 mcg Oral Once per day on Mon Tue Wed Thu Fri  . calcitRIOL  1 mcg Oral Once per day on Sun Sat  . Chlorhexidine Gluconate Cloth  6 each Topical Daily  . insulin aspart  0-9 Units Subcutaneous Q6H  . lisinopril  20 mg Oral Daily  . multivitamin  1 tablet Oral QHS   Continuous Infusions: . sodium chloride    . sodium chloride     PRN Meds:.sodium chloride, sodium chloride, acetaminophen, albuterol, alteplase, diphenhydrAMINE, heparin, hydrALAZINE, HYDROcodone-acetaminophen, lidocaine (PF), lidocaine-prilocaine, pentafluoroprop-tetrafluoroeth  Antibiotics  :   Anti-infectives (From admission, onward)   None        Objective:   Vitals:   07/29/19 0830 07/29/19 0900 07/29/19 0930 07/29/19 1000  BP: (!) 157/88 (!) 168/80 (!) 175/96 (!) 178/90  Pulse: 79 78 79 81  Resp:      Temp:      TempSrc:      SpO2:      Weight:      Height:        SpO2: 100 % O2 Flow Rate (L/min): 3 L/min  Wt Readings from Last 3 Encounters:  07/29/19 125.3 kg  07/22/19 125.4 kg  07/19/19 125.4 kg  Intake/Output Summary (Last 24 hours) at 07/29/2019 1022 Last data filed at 07/28/2019 1817 Gross per 24 hour  Intake 480 ml  Output --  Net 480 ml     Physical Exam  Awake Alert, No new F.N deficits, Normal affect .AT,PERRAL Supple Neck,No JVD, No cervical lymphadenopathy appriciated.  Symmetrical Chest  wall movement, Good air movement bilaterally, CTAB RRR,No Gallops, Rubs or new Murmurs, No Parasternal Heave +ve B.Sounds, Abd Soft, No tenderness, No organomegaly appriciated, No rebound - guarding or rigidity. No Cyanosis, Clubbing or edema, No new Rash or bruise   Data Review:    Recent Labs  Lab 07/26/19 1407 07/27/19 0755 07/27/19 1906 07/28/19 1113 07/28/19 1649 07/28/19 2306 07/29/19 0224  WBC 9.5   < > 10.4 10.5 12.3* 12.4* 11.4*  HGB 4.7*   < > 6.6* 8.8* 9.0* 9.0* 8.5*  HCT 16.1*   < > 21.0* 27.3* 28.4* 28.4* 26.8*  PLT 227   < > 203 207 184 219 202  MCV 81.3   < > 79.5* 81.5 81.8 82.6 83.0  MCH 23.7*   < > 25.0* 26.3 25.9* 26.2 26.3  MCHC 29.2*   < > 31.4 32.2 31.7 31.7 31.7  RDW 23.1*   < > 20.8* 20.3* 20.6* 21.0* 21.1*  LYMPHSABS 1.7  --   --  1.2  --   --  1.3  MONOABS 1.3*  --   --  1.2*  --   --  1.4*  EOSABS 0.5  --   --  0.4  --   --  0.4  BASOSABS 0.1  --   --  0.1  --   --  0.1   < > = values in this interval not displayed.    Recent Labs  Lab 07/26/19 1407 07/27/19 0755 07/28/19 1113 07/29/19 0224  NA 136 138 136 136  K 4.3 5.1 4.4 4.5  CL 98 100 97* 96*  CO2 27 27 27 28   GLUCOSE 187* 143* 117* 139*  BUN 34* 33* 19 26*  CREATININE 5.86* 6.49* 4.83* 5.77*  CALCIUM 8.5* 9.1 8.9 9.0  AST  --   --  16 13*  ALT  --   --  6 <5  ALKPHOS  --   --  70 70  BILITOT  --   --  0.9 0.7  ALBUMIN  --   --  2.8* 2.7*  MG  --  1.8 1.8 1.8  CRP  --  6.6*  --   --   INR  --  1.3*  --   --   BNP  --  1,559.8* 2,777.2* 3,064.4*    Recent Labs  Lab 07/26/19 1900 07/27/19 0755 07/28/19 1113 07/29/19 0224  CRP  --  6.6*  --   --   BNP  --  1,559.8* 2,777.2* 3,064.4*  SARSCOV2NAA NEGATIVE  --   --   --     ------------------------------------------------------------------------------------------------------------------ No results for input(s): CHOL, HDL, LDLCALC, TRIG, CHOLHDL, LDLDIRECT in the last 72 hours.  Lab Results  Component Value Date    HGBA1C 7.2 (H) 07/08/2019   ------------------------------------------------------------------------------------------------------------------ No results for input(s): TSH, T4TOTAL, T3FREE, THYROIDAB in the last 72 hours.  Invalid input(s): FREET3 ------------------------------------------------------------------------------------------------------------------ No results for input(s): VITAMINB12, FOLATE, FERRITIN, TIBC, IRON, RETICCTPCT in the last 72 hours.  Coagulation profile Recent Labs  Lab 07/27/19 0755  INR 1.3*    No results for input(s): DDIMER in the last 72 hours.  Cardiac Enzymes No results for input(s): CKMB, TROPONINI,  MYOGLOBIN in the last 168 hours.  Invalid input(s): CK ------------------------------------------------------------------------------------------------------------------    Component Value Date/Time   BNP 3,064.4 (H) 07/29/2019 0224    Micro Results Recent Results (from the past 240 hour(s))  SARS CORONAVIRUS 2 (TAT 6-24 HRS) Nasopharyngeal Nasopharyngeal Swab     Status: None   Collection Time: 07/20/19 11:31 AM   Specimen: Nasopharyngeal Swab  Result Value Ref Range Status   SARS Coronavirus 2 NEGATIVE NEGATIVE Final    Comment: (NOTE) SARS-CoV-2 target nucleic acids are NOT DETECTED. The SARS-CoV-2 RNA is generally detectable in upper and lower respiratory specimens during the acute phase of infection. Negative results do not preclude SARS-CoV-2 infection, do not rule out co-infections with other pathogens, and should not be used as the sole basis for treatment or other patient management decisions. Negative results must be combined with clinical observations, patient history, and epidemiological information. The expected result is Negative. Fact Sheet for Patients: SugarRoll.be Fact Sheet for Healthcare Providers: https://www.woods-mathews.com/ This test is not yet approved or cleared by the  [place] FDA and  has been authorized for detection and/or diagnosis of SARS-CoV-2 by FDA under an Emergency Use Authorization (EUA). This EUA will remain  in effect (meaning this test can be used) for the duration of the COVID-19 declaration under Section 56 4(b)(1) of the Act, 21 U.S.C. section 360bbb-3(b)(1), unless the authorization is terminated or revoked sooner. Performed at [facility], [place] [address] [place], [place] [address]   Respiratory Panel by RT PCR (Flu A&B, Covid) - Nasopharyngeal Swab     Status: None   Collection Time: 07/26/19  7:00 PM   Specimen: Nasopharyngeal Swab  Result Value Ref Range Status   SARS Coronavirus 2 by RT PCR NEGATIVE NEGATIVE Final    Comment: (NOTE) SARS-CoV-2 target nucleic acids are NOT DETECTED. The SARS-CoV-2 RNA is generally detectable in upper respiratoy specimens during the acute phase of infection. The lowest concentration of SARS-CoV-2 viral copies this assay can detect is 131 copies/mL. A negative result does not preclude SARS-Cov-2 infection and should not be used as the sole basis for treatment or other patient management decisions. A negative result may occur with  improper specimen collection/handling, submission of specimen other than nasopharyngeal swab, presence of viral mutation(s) within the areas targeted by this assay, and inadequate number of viral copies (<131 copies/mL). A negative result must be combined with clinical observations, patient history, and epidemiological information. The expected result is Negative. Fact Sheet for Patients:  PinkCheek.be Fact Sheet for Healthcare Providers:  GravelBags.it This test is not yet ap proved or cleared by the [place] FDA and  has been authorized for detection and/or diagnosis of SARS-CoV-2 by FDA under an Emergency Use Authorization (EUA). This EUA will remain  in effect (meaning this test can be  used) for the duration of the COVID-19 declaration under Section 564(b)(1) of the Act, 21 U.S.C. section 360bbb-3(b)(1), unless the authorization is terminated or revoked sooner.    Influenza A by PCR NEGATIVE NEGATIVE Final   Influenza B by PCR NEGATIVE NEGATIVE Final    Comment: (NOTE) The Xpert Xpress SARS-CoV-2/FLU/RSV assay is intended as an aid in  the diagnosis of influenza from Nasopharyngeal swab specimens and  should not be used as a sole basis for treatment. Nasal washings and  aspirates are unacceptable for Xpert Xpress SARS-CoV-2/FLU/RSV  testing. Fact Sheet for Patients: PinkCheek.be Fact Sheet for Healthcare Providers: GravelBags.it This test is not yet approved or cleared by the [place]  FDA and  has been authorized for detection and/or diagnosis of SARS-CoV-2 by  FDA under an Emergency Use Authorization (EUA). This EUA will remain  in effect (meaning this test can be used) for the duration of the  Covid-19 declaration under Section 564(b)(1) of the Act, 21  U.S.C. section 360bbb-3(b)(1), unless the authorization is  terminated or revoked. Performed at [facility], [address] [place], [place] [address]     Radiology Reports CT ABDOMEN PELVIS WO CONTRAST  Result Date: 07/09/2019 CLINICAL DATA:  Abdomen pain EXAM: CT ABDOMEN AND PELVIS WITHOUT CONTRAST TECHNIQUE: Multidetector CT imaging of the abdomen and pelvis was performed following the standard protocol without IV contrast. COMPARISON:  05/27/2019, 04/08/2019 FINDINGS: Lower chest: Lung bases demonstrate heterogeneous mostly peripheral opacities likely scarring from prior COVID pneumonia. No consolidation or pleural effusion. Coronary vascular calcification. Mild cardiomegaly. Hepatobiliary: No focal liver abnormality is seen. Status post cholecystectomy. No biliary dilatation. Pancreas: Unremarkable. No pancreatic ductal dilatation or surrounding  inflammatory changes. Spleen: Normal in size without focal abnormality. Adrenals/Urinary Tract: Adrenal glands are slightly thickened but without dominant mass. Atrophic kidneys with extensive vascular calcification. No hydronephrosis. Unremarkable urinary bladder Stomach/Bowel: Stomach is within normal limits. Appendix appears normal. No evidence of bowel wall thickening, distention, or inflammatory changes. Vascular/Lymphatic: Extensive aortic atherosclerosis. No aneurysm. No suspicious adenopathy Reproductive: IUD in the uterus Other: No free air. Moderate to large loculated ascites within the anterior abdominal cavity. Peritoneal dialysis catheter coiled within the right anterior abdomen. Musculoskeletal: No acute or suspicious osseous abnormality. IMPRESSION: 1. Moderate to large volume of loculated appearing ascites within the anterior abdominal cavity 2. Heterogenous peripheral densities at the bases felt secondary to prior COVID pneumonia and scarring 3. Cardiomegaly.  Anasarca.  Atrophic native kidneys Electronically Signed   By: Donavan Foil M.D.   On: 07/09/2019 03:22   CT ABDOMEN PELVIS W CONTRAST  Result Date: 07/26/2019 CLINICAL DATA:  Anemia, dialysis EXAM: CT ABDOMEN AND PELVIS WITH CONTRAST TECHNIQUE: Multidetector CT imaging of the abdomen and pelvis was performed using the standard protocol following bolus administration of intravenous contrast. CONTRAST:  132mL OMNIPAQUE IOHEXOL 300 MG/ML  SOLN COMPARISON:  07/09/2019, 05/27/2019 FINDINGS: Lower chest: There is extensive bilateral ground-glass and irregular interstitial opacity in the included lung bases. Cardiomegaly. Coronary artery calcifications. Hepatobiliary: No focal liver abnormality is seen. Status post cholecystectomy. No biliary dilatation. Pancreas: Unremarkable. No pancreatic ductal dilatation or surrounding inflammatory changes. Spleen: Normal in size without significant abnormality. Adrenals/Urinary Tract: Adrenal glands are  unremarkable. Atrophic appearing kidneys. No hydronephrosis. Bladder is unremarkable. Stomach/Bowel: Stomach is within normal limits. Appendix appears normal. No evidence of bowel wall thickening, distention, or inflammatory changes. Vascular/Lymphatic: Aortic atherosclerosis. Extensive vascular calcinosis. No enlarged abdominal or pelvic lymph nodes. Reproductive: No mass or other significant abnormality. Other: Anasarca. Interval enlargement of a large, loculated fluid collection in the ventral abdomen, measuring approximately 29.0 x 14.0 x 21.0 cm, now containing extensive internal heterogeneous attenuation. There is a tunneled Tenckhoff type peritoneal dialysis catheter within this collection. Musculoskeletal: Plate and screw fixation of the left acetabulum. IMPRESSION: 1. Interval enlargement of a large, loculated fluid collection in the ventral abdomen, measuring approximately 29.0 x 14.0 x 21.0 cm, now containing extensive internal heterogeneous attenuation. This appearance is most consistent with a pseudocyst in the setting of peritoneal dialysis and concerning for internal hemorrhage given report of anemia. 2. Extensive bilateral ground-glass and irregular interstitial opacity in the included bilateral lung bases, similar to prior examination and concerning for fibrotic interstitial  lung disease although incompletely characterized. Consider dedicated ILD protocol imaging of the chest on a nonemergent basis. 3. Other chronic and incidental findings as above. Aortic Atherosclerosis (ICD10-I70.0). Electronically Signed   By: Eddie Candle M.D.   On: 07/26/2019 16:41   IR Fluoro Guide CV Line Right  Result Date: 07/17/2019 CLINICAL DATA:  End-stage renal disease. Recent removal of peritoneal dialysis catheter. Hemodialysis access required EXAM: TUNNELED HEMODIALYSIS CATHETER PLACEMENT WITH ULTRASOUND AND FLUOROSCOPIC GUIDANCE TECHNIQUE: The procedure, risks, benefits, and alternatives were explained to the  patient. Questions regarding the procedure were encouraged and answered. The patient understands and consents to the procedure. As antibiotic prophylaxis, cefazolin 3 g was ordered pre-procedure and administered intravenously within one hour of incision.Patency of the right IJ vein was confirmed with ultrasound with image documentation. An appropriate skin site was determined. Region was prepped using maximum barrier technique including cap and mask, sterile gown, sterile gloves, large sterile sheet, and Chlorhexidine as cutaneous antisepsis. The region was infiltrated locally with 1% lidocaine. Intravenous Fentanyl 65mcg and Versed 1mg  were administered as conscious sedation during continuous monitoring of the patient's level of consciousness and physiological / cardiorespiratory status by the radiology RN, with a total moderate sedation time of 15 minutes. Under real-time ultrasound guidance, the right IJ vein was accessed with a 21 gauge micropuncture needle; the needle tip within the vein was confirmed with ultrasound image documentation. Needle exchanged over the 018 guidewire for transitional dilator, which allowed advancement of a Benson wire into the IVC. Over this, an MPA catheter was advanced. A Palindrome 19 hemodialysis catheter was tunneled from the right anterior chest wall approach to the right IJ dermatotomy site. The MPA catheter was exchanged over an Amplatz wire for serial vascular dilators which allow placement of a peel-away sheath, through which the catheter was advanced under intermittent fluoroscopy, positioned with its tips in the proximal and midright atrium. Spot chest radiograph confirms good catheter position. No pneumothorax. Catheter was flushed and primed per protocol. Catheter secured externally with O Prolene sutures. The right IJ dermatotomy site was closed with Dermabond. COMPLICATIONS: COMPLICATIONS None immediate FLUOROSCOPY TIME:  0.5 minutes; 70 uGym2 DAP COMPARISON:  None  IMPRESSION: 1. Technically successful placement of tunneled right IJ hemodialysis catheter with ultrasound and fluoroscopic guidance. Ready for routine use. ACCESS: Remains approachable for percutaneous intervention as needed. Electronically Signed   By: Lucrezia Europe M.D.   On: 07/17/2019 15:43   IR US Guide Vasc Access Right  Result Date: 07/17/2019 CLINICAL DATA:  End-stage renal disease. Recent removal of peritoneal dialysis catheter. Hemodialysis access required EXAM: TUNNELED HEMODIALYSIS CATHETER PLACEMENT WITH ULTRASOUND AND FLUOROSCOPIC GUIDANCE TECHNIQUE: The procedure, risks, benefits, and alternatives were explained to the patient. Questions regarding the procedure were encouraged and answered. The patient understands and consents to the procedure. As antibiotic prophylaxis, cefazolin 3 g was ordered pre-procedure and administered intravenously within one hour of incision.Patency of the right IJ vein was confirmed with ultrasound with image documentation. An appropriate skin site was determined. Region was prepped using maximum barrier technique including cap and mask, sterile gown, sterile gloves, large sterile sheet, and Chlorhexidine as cutaneous antisepsis. The region was infiltrated locally with 1% lidocaine. Intravenous Fentanyl 36mcg and Versed 1mg  were administered as conscious sedation during continuous monitoring of the patient's level of consciousness and physiological / cardiorespiratory status by the radiology RN, with a total moderate sedation time of 15 minutes. Under real-time ultrasound guidance, the right IJ vein was accessed with a 21 gauge micropuncture needle;  the needle tip within the vein was confirmed with ultrasound image documentation. Needle exchanged over the 018 guidewire for transitional dilator, which allowed advancement of a Benson wire into the IVC. Over this, an MPA catheter was advanced. A Palindrome 19 hemodialysis catheter was tunneled from the right anterior chest  wall approach to the right IJ dermatotomy site. The MPA catheter was exchanged over an Amplatz wire for serial vascular dilators which allow placement of a peel-away sheath, through which the catheter was advanced under intermittent fluoroscopy, positioned with its tips in the proximal and midright atrium. Spot chest radiograph confirms good catheter position. No pneumothorax. Catheter was flushed and primed per protocol. Catheter secured externally with O Prolene sutures. The right IJ dermatotomy site was closed with Dermabond. COMPLICATIONS: COMPLICATIONS None immediate FLUOROSCOPY TIME:  0.5 minutes; 70 uGym2 DAP COMPARISON:  None IMPRESSION: 1. Technically successful placement of tunneled right IJ hemodialysis catheter with ultrasound and fluoroscopic guidance. Ready for routine use. ACCESS: Remains approachable for percutaneous intervention as needed. Electronically Signed   By: Lucrezia Europe M.D.   On: 07/17/2019 15:43   DG Chest Port 1 View  Result Date: 07/09/2019 CLINICAL DATA:  Short of breath EXAM: PORTABLE CHEST 1 VIEW COMPARISON:  04/02/2019 FINDINGS: Low lung volumes. Cardiomegaly. Diffuse bilateral reticular and ground-glass opacity. No pleural effusion or pneumothorax. IMPRESSION: Cardiomegaly with mild diffuse fine reticular and ground-glass opacity similar compared to prior, question underlying chronic interstitial lung disease. Electronically Signed   By: Donavan Foil M.D.   On: 07/09/2019 00:44   ECHOCARDIOGRAM COMPLETE  Result Date: 07/09/2019    ECHOCARDIOGRAM REPORT   Patient Name:   [facility] Date of Exam: 07/09/2019 Medical Rec #:  [id number]          Height:       68.0 in Accession #:    [id number]         Weight:       250.7 lb Date of Birth:  [birth year]           BSA:          2.250 m Patient Age:    70 years           BP:           161/73 mmHg Patient Gender: F                  HR:           76 bpm. Exam Location:  Inpatient Procedure: 2D Echo, Cardiac Doppler and Color  Doppler Indications:    I50.23 Acute on chronic systolic (congestive) heart failure  History:        Patient has no prior history of Echocardiogram examinations.                 Risk Factors:Hypertension and Diabetes. ESRD. COVID-19.  Sonographer:    Jonelle Sidle Dance Referring Phys: [id number] Levan  1. Left ventricular ejection fraction, by estimation, is 50 to 55%. The left ventricle has normal function. The left ventricle demonstrates regional wall motion abnormalities (see scoring diagram/findings for description). There is moderate concentric left ventricular hypertrophy. Left ventricular diastolic parameters are consistent with Grade II diastolic dysfunction (pseudonormalization). Elevated left atrial pressure. There is mild hypokinesis of the left ventricular, entire inferolateral wall.  2. Right ventricular systolic function is mildly reduced. The right ventricular size is normal. There is moderately elevated pulmonary artery systolic pressure.  3. Left atrial size was moderately dilated.  4.  Right atrial size was mildly dilated.  5. The mitral valve is normal in structure. Trivial mitral valve regurgitation.  6. Tricuspid valve regurgitation is mild to moderate.  7. The aortic valve is normal in structure. Aortic valve regurgitation is trivial.  8. The inferior vena cava is dilated in size with >50% respiratory variability, suggesting right atrial pressure of 8 mmHg. FINDINGS  Left Ventricle: Left ventricular ejection fraction, by estimation, is 50 to 55%. The left ventricle has normal function. The left ventricle demonstrates regional wall motion abnormalities. Mild hypokinesis of the left ventricular, entire inferolateral wall. The left ventricular internal cavity size was normal in size. There is moderate concentric left ventricular hypertrophy. Left ventricular diastolic parameters are consistent with Grade II diastolic dysfunction (pseudonormalization). Elevated left atrial pressure.  Right Ventricle: The right ventricular size is normal. No increase in right ventricular wall thickness. Right ventricular systolic function is mildly reduced. There is moderately elevated pulmonary artery systolic pressure. The tricuspid regurgitant velocity is 2.91 m/s, and with an assumed right atrial pressure of 8 mmHg, the estimated right ventricular systolic pressure is 51.8 mmHg. Left Atrium: Left atrial size was moderately dilated. Right Atrium: Right atrial size was mildly dilated. Pericardium: There is no evidence of pericardial effusion. Mitral Valve: The mitral valve is normal in structure. Mild mitral annular calcification. Trivial mitral valve regurgitation. Tricuspid Valve: The tricuspid valve is normal in structure. Tricuspid valve regurgitation is mild to moderate. Aortic Valve: The aortic valve is normal in structure. Aortic valve regurgitation is trivial. Pulmonic Valve: The pulmonic valve was normal in structure. Pulmonic valve regurgitation is not visualized. Aorta: The aortic root and ascending aorta are structurally normal, with no evidence of dilitation. Venous: The inferior vena cava is dilated in size with greater than 50% respiratory variability, suggesting right atrial pressure of 8 mmHg. IAS/Shunts: No atrial level shunt detected by color flow Doppler.  LEFT VENTRICLE PLAX 2D LVIDd:         5.30 cm  Diastology LVIDs:         3.70 cm  LV e' lateral:   8.05 cm/s LV PW:         1.50 cm  LV E/e' lateral: 16.8 LV IVS:        1.30 cm  LV e' medial:    5.22 cm/s LVOT diam:     1.90 cm  LV E/e' medial:  25.9 LV SV:         57 LV SV Index:   25 LVOT Area:     2.84 cm  RIGHT VENTRICLE            IVC RV Basal diam:  3.50 cm    IVC diam: 2.20 cm RV Mid diam:    2.10 cm RV S prime:     9.03 cm/s TAPSE (M-mode): 2.0 cm LEFT ATRIUM              Index       RIGHT ATRIUM           Index LA diam:        4.80 cm  2.13 cm/m  RA Area:     19.00 cm LA Vol (A2C):   107.0 ml 47.56 ml/m RA Volume:   54.50 ml   24.23 ml/m LA Vol (A4C):   77.4 ml  34.40 ml/m LA Biplane Vol: 94.9 ml  42.18 ml/m  AORTIC VALVE LVOT Vmax:   83.00 cm/s LVOT Vmean:  61.400 cm/s LVOT VTI:    0.200  m  AORTA Ao Root diam: 3.10 cm Ao Asc diam:  3.15 cm MITRAL VALVE                TRICUSPID VALVE MV Area (PHT): 4.21 cm     TR Peak grad:   33.9 mmHg MV Decel Time: 180 msec     TR Vmax:        291.00 cm/s MV E velocity: 135.00 cm/s MV A velocity: 79.60 cm/s   SHUNTS MV E/A ratio:  1.70         Systemic VTI:  0.20 m                             Systemic Diam: 1.90 cm Dani Gobble Croitoru MD Electronically signed by Sanda Klein MD Signature Date/Time: 07/09/2019/1:54:09 PM    Final    VAS [place] UPPER EXT VEIN MAPPING (PRE-OP AVF)  Result Date: 07/18/2019 UPPER EXTREMITY VEIN MAPPING  Indications: Pre-access. Comparison Study: No prior studies. Performing Technologist: Oliver Hum RVT  Examination Guidelines: A complete evaluation includes B-mode imaging, spectral Doppler, color Doppler, and power Doppler as needed of all accessible portions of each vessel. Bilateral testing is considered an integral part of a complete examination. Limited examinations for reoccurring indications may be performed as noted. +-----------------+-------------+----------+--------------+ Right Cephalic   Diameter (cm)Depth (cm)   Findings    +-----------------+-------------+----------+--------------+ Shoulder             0.32        1.30                  +-----------------+-------------+----------+--------------+ Prox upper arm       0.52        0.75     branching    +-----------------+-------------+----------+--------------+ Mid upper arm        0.48        0.93                  +-----------------+-------------+----------+--------------+ Dist upper arm       0.36        0.49     branching    +-----------------+-------------+----------+--------------+ Antecubital fossa    0.52        0.23      Thrombus     +-----------------+-------------+----------+--------------+ Prox forearm         0.29        0.75     branching    +-----------------+-------------+----------+--------------+ Mid forearm          0.31        0.49                  +-----------------+-------------+----------+--------------+ Dist forearm                            not visualized +-----------------+-------------+----------+--------------+ +-----------------+-------------+----------+---------+ Right Basilic    Diameter (cm)Depth (cm)Findings  +-----------------+-------------+----------+---------+ Shoulder             0.58        1.30             +-----------------+-------------+----------+---------+ Mid upper arm        0.40        1.50             +-----------------+-------------+----------+---------+ Dist upper arm       0.52        1.10             +-----------------+-------------+----------+---------+  Antecubital fossa    0.27        0.74   branching +-----------------+-------------+----------+---------+ Prox forearm         0.23        0.43             +-----------------+-------------+----------+---------+ Mid forearm          0.26        0.17             +-----------------+-------------+----------+---------+ Distal forearm       0.29        0.14             +-----------------+-------------+----------+---------+ +-----------------+-------------+----------+----------------------+ Left Cephalic    Diameter (cm)Depth (cm)       Findings        +-----------------+-------------+----------+----------------------+ Shoulder             0.33        0.87                          +-----------------+-------------+----------+----------------------+ Prox upper arm       0.26        1.30         branching        +-----------------+-------------+----------+----------------------+ Mid upper arm        0.24        0.98                           +-----------------+-------------+----------+----------------------+ Dist upper arm       0.21        0.60         branching        +-----------------+-------------+----------+----------------------+ Antecubital fossa    0.32        0.28                          +-----------------+-------------+----------+----------------------+ Prox forearm         0.49        0.84   branching and Thrombus +-----------------+-------------+----------+----------------------+ Mid forearm                                 not visualized     +-----------------+-------------+----------+----------------------+ Dist forearm         0.21        0.31                          +-----------------+-------------+----------+----------------------+ +-----------------+-------------+----------+---------+ Left Basilic     Diameter (cm)Depth (cm)Findings  +-----------------+-------------+----------+---------+ Shoulder             0.68        1.50             +-----------------+-------------+----------+---------+ Mid upper arm        0.46        1.40             +-----------------+-------------+----------+---------+ Dist upper arm       0.57        1.20             +-----------------+-------------+----------+---------+ Antecubital fossa    0.43        0.68   branching +-----------------+-------------+----------+---------+ Prox forearm         0.41        0.55  branching +-----------------+-------------+----------+---------+ Mid forearm          0.33        0.60   branching +-----------------+-------------+----------+---------+ Distal forearm       0.24        0.44             +-----------------+-------------+----------+---------+ *See table(s) above for measurements and observations.  Diagnosing physician: Monica Martinez MD Electronically signed by Monica Martinez MD on 07/18/2019 at 8:13:03 PM.    Final    IR Paracentesis  Result Date: 07/09/2019 INDICATION: Patient with history of  end stage renal disease admitted with abdominal pain and ascites presents for diagnostic paracentesis maximum 100 ml per Team's request. EXAM: ULTRASOUND GUIDED DIAGNOSTIC PARACENTESIS MEDICATIONS: Lidocaine 1% 10 mL COMPLICATIONS: None immediate. PROCEDURE: Informed written consent was obtained from the patient after a discussion of the risks, benefits and alternatives to treatment. A timeout was performed prior to the initiation of the procedure. Initial ultrasound scanning demonstrates a large amount of ascites within the right lower abdominal quadrant. The right lower abdomen was prepped and draped in the usual sterile fashion. 1% lidocaine was used for local anesthesia. Following this, a 19 gauge, 10-cm, Yueh catheter was introduced. An ultrasound image was saved for documentation purposes. The paracentesis was performed. The catheter was removed and a dressing was applied. The patient tolerated the procedure well without immediate post procedural complication. FINDINGS: A total of approximately 100 m of clear fluid was removed. Samples were sent to the laboratory as requested by the clinical team. IMPRESSION: Successful ultrasound-guided therapeutic paracentesis yielding 100 mililiters of peritoneal fluid. Read by Rushie Nyhan NP Electronically Signed   By: Markus Daft M.D.   On: 07/09/2019 09:12    Time Spent in minutes  30   Lala Lund M.D on 07/29/2019 at 10:22 AM  To page go to [email or web address] - password [facility]

## 2019-07-29 NOTE — Evaluation (Signed)
Occupational Therapy Evaluation Patient Details Name: Janet Mitchell MRN: [id number] DOB: 04-16-1965 Today's Date: 07/29/2019    History of Present Illness Janet Mitchell  is a 54 y.o. female, with past medical history of end-stage renal disease on peritoneal hemodialysis since 2017, recently transition to hemodialysis 4/21, hypertension, diabetes mellitus type 2 , patient with rehospitalization at [facility] secondary to peritoneal dialysis catheter peritonitis, which was treated, then she was started on dialysis, first session as an outpatient with this Monday, she presented to dialysis, her repeat CBC came back significant for drop in hemoglobin to 4.3, from baseline 9.2, patient herself only complains of fatigue, dyspnea at baseline, she denies any chest pain, dizziness, lightheadedness, syncope or presyncope, as well denies fever or chills, no melena, coffee-ground emesis, she reports mild periods where she used only 1 pad recently.   Clinical Impression   This 54 yo female admitted with above presents to acute OT with decreased in function since last admission, currently needing increased A for sit<>stand and LB ADLs due to increased fluid in legs and abdomen. She will benefit from acute OT with follow up on CIR (but if decides against then HHOT) 24 hour S/prn A.     Follow Up Recommendations  CIR;Supervision/Assistance - 24 hour(if decides no then HHOT)    Equipment Recommendations  None recommended by OT       Precautions / Restrictions Precautions Precautions: Fall Precaution Comments: LUE restricted extremity--new fistula 7 days ago Restrictions Weight Bearing Restrictions: No      Mobility Bed Mobility               General bed mobility comments: Pt up in recliner upon arrival  Transfers  Min A sit<>stand from recliner                    Balance Overall balance assessment: Needs assistance Sitting-balance support: No upper extremity  supported Sitting balance-Leahy Scale: Good     Standing balance support: Single extremity supported Standing balance-Leahy Scale: Poor                             ADL either performed or assessed with clinical judgement   ADL Overall ADL's : Needs assistance/impaired Eating/Feeding: Independent;Sitting   Grooming: Set up;Sitting   Upper Body Bathing: Set up;Sitting   Lower Body Bathing: Moderate assistance Lower Body Bathing Details (indicate cue type and reason): min A from recliner sit<>stand (due to lower surface) Upper Body Dressing : Set up;Sitting   Lower Body Dressing: Moderate assistance Lower Body Dressing Details (indicate cue type and reason): min A from recliner sit<>stand (due to lower surface) Toilet Transfer: Minimal assistance Toilet Transfer Details (indicate cue type and reason): sit<.>stand; min guard A ambulation in room Toileting- Clothing Manipulation and Hygiene: Minimal assistance;Sit to/from stand               Vision Patient Visual Report: No change from baseline              Pertinent Vitals/Pain Pain Assessment: No/denies pain     Hand Dominance Right   Extremity/Trunk Assessment Upper Extremity Assessment Upper Extremity Assessment: Generalized weakness           Communication Communication Communication: No difficulties   Cognition Arousal/Alertness: Awake/alert Behavior During Therapy: WFL for tasks assessed/performed Overall Cognitive Status: Within Functional Limits for tasks assessed  Home Living Family/patient expects to be discharged to:: Private residence Living Arrangements: Spouse/significant other;Other relatives Available Help at Discharge: Family Type of Home: House Home Access: Stairs to enter [employer] of Steps: 4 Entrance Stairs-Rails: Left Home Layout: One level     Bathroom Shower/Tub: Animal nutritionist: [place]: Tub bench;Walker - 2 wheels;Wheelchair - manual;Cane - single point   Additional Comments: sponge baths; higher bed at home makes it easier to get in and out of; pt's husband raised up a chair for her to make it easier to get in and out of      Prior Functioning/Environment Level of Independence: Needs assistance  Gait / Transfers Assistance Needed: Reports 3-4 falls in recent months. Been using a w/c for community mobility ADL's / Homemaking Assistance Needed: Has been completing ADL's with difficulty due to increased swelling   Comments: Per chart review, pt has been very weak with almost no ambulation since Covid hospitalization, prior to that able to ambulate and do ADLs w/o assist or AD. States husband has been helping some with LB ADLs since COVID hospitalization        OT Problem List: Decreased strength;Decreased activity tolerance;Impaired balance (sitting and/or standing);Decreased knowledge of use of DME or AE      OT Treatment/Interventions: Self-care/ADL training;DME and/or AE instruction;Therapeutic activities;Patient/family education;Balance training    OT Goals(Current goals can be found in the care plan section) Acute Rehab OT Goals Patient Stated Goal: to go home OT Goal Formulation: With patient Time For Goal Achievement: 08/12/19 Potential to Achieve Goals: Good  OT Frequency: Min 2X/week              AM-PAC OT "6 Clicks" Daily Activity     Outcome Measure Help from another person eating meals?: None Help from another person taking care of personal grooming?: A Little Help from another person toileting, which includes using toliet, bedpan, or urinal?: A Little Help from another person bathing (including washing, rinsing, drying)?: A Lot Help from another person to put on and taking off regular upper body clothing?: A Little Help from another person to put on and taking off regular lower body clothing?: A  Lot 6 Click Score: 17   End of Session Equipment Utilized During Treatment: Gait belt  Activity Tolerance: Patient tolerated treatment well Patient left: in chair;with call bell/phone within reach  OT Visit Diagnosis: Unsteadiness on feet (R26.81);Other abnormalities of gait and mobility (R26.89);Muscle weakness (generalized) (M62.81);History of falling (Z91.81)                Time: 7322-0254 OT Time Calculation (min): 32 min Charges:  OT General Charges $OT Visit: 1 Visit OT Evaluation $OT Eval Moderate Complexity: 1 Mod OT Treatments $Self Care/Home Management : 8-22 mins  [name], OTR/L Acute [employer] Pager [phone] Office [phone]     [name] 07/29/2019, 5:54 PM

## 2019-07-29 NOTE — Progress Notes (Signed)
   07/29/19 2055  Assess: MEWS Score  Temp 98.9 F (37.2 C)  BP (!) 165/97  Pulse Rate 87  ECG Heart Rate 87  Resp (!) 22  Level of Consciousness Alert  SpO2 92 %  O2 Device Room Air  Patient Activity (if Appropriate) In bed  Assess: MEWS Score  MEWS Temp 0  MEWS Systolic 0  MEWS Pulse 0  MEWS RR 1  MEWS LOC 0  MEWS Score 1  MEWS Score Color Green  Assess: if the MEWS score is Yellow or Red  Were vital signs taken at a resting state? Yes  Focused Assessment Documented focused assessment  Early Detection of Sepsis Score *See Row Information* Low  MEWS guidelines implemented *See Row Information* No, other (Comment) (no acute changes)

## 2019-07-29 NOTE — Progress Notes (Signed)
Patient ID: Janet Mitchell, female   DOB: 11/05/1965, 54 y.o.   MRN: [id number]  [facility] Progress Note   Assessment/ Plan:   1.  Acute blood loss anemia: superimposed on chronic anemia of ESRD with bleeding around her PD catheter site based on CT scan findings.  This appears to have clinically stopped and hemoglobin/hematocrit improved commensurate with PRBC transfusion.  No plans for surgical intervention and will likely need elective removal of PD catheter as an outpatient. Will attempt to flush PD catheter tomorrow.  2. ESRD: Recently converted to hemodialysis from peritoneal dialysis following presumptive membrane failure.  Will undertake daily hemodialysis at this time for efforts at volume unloading.  Avoid heparin with dialysis.  Status post left first stage BBF on 07/22/2019. 3. CKD-MBD: Continue calcitriol for PTH suppression and check phosphorus with labs tomorrow.  Remains on renal diet. 4. Nutrition: Continue renal diet with ONS, renal multivitamin. 5. Hypertension: Blood pressure intermittently elevated but with some episodes of intradialytic blood pressure drop limiting ultrafiltration.  Significant volume overload noted.  DC'd amlodipine with the suspicion that this might be contributing to her lower extremity edema and added lisinopril and hydralazine.   Kelly Splinter, MD 07/29/2019, 4:02 PM    Subjective:   No new c/o's. No sig abd pain.    Objective:   BP (!) 155/79 (BP Location: Right Arm)   Pulse 83   Temp 98.2 F (36.8 C) (Oral)   Resp 20   Ht 5' 9.6" (1.768 m)   Wt 121.8 kg   SpO2 99%   BMI 38.97 kg/m   Physical Exam: Gen: Comfortably sitting up on the side of her bed CVS: Pulse regular rhythm, normal S1 and S2 Resp: Diminished breath sounds over bases, no distinct rales Abd: Soft, obese, tender around epigastric/periumbilical area, PD cath w/ some old blood in the tube Ext: 2-3+ edema over lower extremities.  Left upper arm with palpable  thrill over BBF  Labs: BMET Recent Labs  Lab 07/26/19 1407 07/27/19 0755 07/28/19 1113 07/29/19 0224  NA 136 138 136 136  K 4.3 5.1 4.4 4.5  CL 98 100 97* 96*  CO2 27 27 27 28   GLUCOSE 187* 143* 117* 139*  BUN 34* 33* 19 26*  CREATININE 5.86* 6.49* 4.83* 5.77*  CALCIUM 8.5* 9.1 8.9 9.0  PHOS  --   --  4.1  --    CBC Recent Labs  Lab 07/26/19 1407 07/27/19 0755 07/28/19 1113 07/28/19 1649 07/28/19 2306 07/29/19 0224  WBC 9.5   < > 10.5 12.3* 12.4* 11.4*  NEUTROABS 5.8  --  7.5  --   --  8.2*  HGB 4.7*   < > 8.8* 9.0* 9.0* 8.5*  HCT 16.1*   < > 27.3* 28.4* 28.4* 26.8*  MCV 81.3   < > 81.5 81.8 82.6 83.0  PLT 227   < > 207 184 219 202   < > = values in this interval not displayed.     Medications:    . amLODipine  10 mg Oral Daily  . calcitRIOL  0.5 mcg Oral Once per day on Mon Tue Wed Thu Fri  . calcitRIOL  1 mcg Oral Once per day on Sun Sat  . Chlorhexidine Gluconate Cloth  6 each Topical Daily  . insulin aspart  0-9 Units Subcutaneous Q6H  . lisinopril  20 mg Oral Daily  . multivitamin  1 tablet Oral QHS

## 2019-07-29 NOTE — Progress Notes (Signed)
Inpatient Rehabilitation Admissions Coordinator  Inpatient rehab consult received. I met with patient at bedside for rehab assessment. We discussed goals and expectations of a possible inpt rehab admit. Patient and her husband to discuss their preference for CIR vs Home with [facility]. I will follow up tomorrow.  Danne Baxter, RN, MSN Rehab Admissions Coordinator [phone] 07/29/2019 12:28 PM

## 2019-07-29 NOTE — Progress Notes (Signed)
PT Cancellation Note  Patient Details Name: Janet Mitchell MRN: [id number] DOB: 1965/09/11   Cancelled Treatment:    Reason Eval/Treat Not Completed: Patient at procedure or test/unavailable Pt at dialysis.  Will follow up as able. Maggie Font, PT Acute Rehab Services Pager [phone] [facility] [phone]   Karlton Lemon 07/29/2019, 9:39 AM

## 2019-07-29 NOTE — Plan of Care (Signed)

## 2019-07-30 ENCOUNTER — Encounter (HOSPITAL_COMMUNITY): Payer: Self-pay | Admitting: Internal Medicine

## 2019-07-30 DIAGNOSIS — Z992 Dependence on renal dialysis: Secondary | ICD-10-CM

## 2019-07-30 LAB — CBC WITH DIFFERENTIAL/PLATELET
Abs Immature Granulocytes: 0.05 10*3/uL (ref 0.00–0.07)
Basophils Absolute: 0.1 10*3/uL (ref 0.0–0.1)
Basophils Relative: 1 %
Eosinophils Absolute: 0.5 10*3/uL (ref 0.0–0.5)
Eosinophils Relative: 5 %
HCT: 27.9 % — ABNORMAL LOW (ref 36.0–46.0)
Hemoglobin: 8.7 g/dL — ABNORMAL LOW (ref 12.0–15.0)
Immature Granulocytes: 1 %
Lymphocytes Relative: 12 %
Lymphs Abs: 1.1 10*3/uL (ref 0.7–4.0)
MCH: 26.3 pg (ref 26.0–34.0)
MCHC: 31.2 g/dL (ref 30.0–36.0)
MCV: 84.3 fL (ref 80.0–100.0)
Monocytes Absolute: 1.4 10*3/uL — ABNORMAL HIGH (ref 0.1–1.0)
Monocytes Relative: 14 %
Neutro Abs: 6.7 10*3/uL (ref 1.7–7.7)
Neutrophils Relative %: 67 %
Platelets: 189 10*3/uL (ref 150–400)
RBC: 3.31 MIL/uL — ABNORMAL LOW (ref 3.87–5.11)
RDW: 21.6 % — ABNORMAL HIGH (ref 11.5–15.5)
WBC: 9.9 10*3/uL (ref 4.0–10.5)
nRBC: 0 % (ref 0.0–0.2)

## 2019-07-30 LAB — GLUCOSE, CAPILLARY
Glucose-Capillary: 100 mg/dL — ABNORMAL HIGH (ref 70–99)
Glucose-Capillary: 102 mg/dL — ABNORMAL HIGH (ref 70–99)
Glucose-Capillary: 127 mg/dL — ABNORMAL HIGH (ref 70–99)

## 2019-07-30 LAB — COMPREHENSIVE METABOLIC PANEL
ALT: 5 U/L (ref 0–44)
AST: 13 U/L — ABNORMAL LOW (ref 15–41)
Albumin: 2.4 g/dL — ABNORMAL LOW (ref 3.5–5.0)
Alkaline Phosphatase: 63 U/L (ref 38–126)
Anion gap: 11 (ref 5–15)
BUN: 16 mg/dL (ref 6–20)
CO2: 29 mmol/L (ref 22–32)
Calcium: 9.2 mg/dL (ref 8.9–10.3)
Chloride: 96 mmol/L — ABNORMAL LOW (ref 98–111)
Creatinine, Ser: 4.58 mg/dL — ABNORMAL HIGH (ref 0.44–1.00)
GFR calc Af Amer: 12 mL/min — ABNORMAL LOW (ref >60)
GFR calc non Af Amer: 10 mL/min — ABNORMAL LOW (ref >60)
Glucose, Bld: 103 mg/dL — ABNORMAL HIGH (ref 70–99)
Potassium: 4 mmol/L (ref 3.5–5.1)
Sodium: 136 mmol/L (ref 135–145)
Total Bilirubin: 0.9 mg/dL (ref 0.3–1.2)
Total Protein: 7.2 g/dL (ref 6.5–8.1)

## 2019-07-30 LAB — MAGNESIUM: Magnesium: 1.6 mg/dL — ABNORMAL LOW (ref 1.7–2.4)

## 2019-07-30 LAB — BRAIN NATRIURETIC PEPTIDE: B Natriuretic Peptide: 3228.2 pg/mL — ABNORMAL HIGH (ref 0.0–100.0)

## 2019-07-30 MED ORDER — CALCITRIOL 0.5 MCG PO CAPS
ORAL_CAPSULE | ORAL | Status: AC
  Filled 2019-07-30: qty 1

## 2019-07-30 MED ORDER — ASPIRIN EC 81 MG PO TBEC: 81.0000 mg | DELAYED_RELEASE_TABLET | Freq: Every day | ORAL | Status: DC

## 2019-07-30 NOTE — Progress Notes (Signed)
Physical Therapy Attempt  Patient off the floor at dialysis at this time.  Per secure message from team, patient wishes to discharge home instead of to CIR. PT evaluation 07/28/19 notes patient required heavy modA for transfer.  PT to attempt at a later time/date.     Birdie Hopes, PT, DPT Acute Rehab [phone] office

## 2019-07-30 NOTE — Discharge Instructions (Signed)
Follow with Primary MD Lucianne Lei, MD in 7 days   Get CBC, CMP checked next visit within 1 week by Primary MD   Activity: As tolerated with Full fall precautions use walker/cane & assistance as needed  Disposition Home   Diet: Renal, low carbohydrate diet.  1.5 L fluid restriction per day.  Special Instructions: If you have smoked or chewed Tobacco  in the last 2 yrs please stop smoking, stop any regular Alcohol  and or any Recreational drug use.  On your next visit with your primary care physician please Get Medicines reviewed and adjusted.  Please request your Prim.MD to go over all Hospital Tests and Procedure/Radiological results at the follow up, please get all Hospital records sent to your Prim MD by signing hospital release before you go home.  If you experience worsening of your admission symptoms, develop shortness of breath, life threatening emergency, suicidal or homicidal thoughts you must seek medical attention immediately by calling 911 or calling your MD immediately  if symptoms less severe.  You Must read complete instructions/literature along with all the possible adverse reactions/side effects for all the Medicines you take and that have been prescribed to you. Take any new Medicines after you have completely understood and accpet all the possible adverse reactions/side effects.

## 2019-07-30 NOTE — Plan of Care (Signed)
Patient is has a good bowel pattern, patient OOB to chair daily.PRN benadryl is used for itching helps relax patient and decreases her anxiety.

## 2019-07-30 NOTE — Progress Notes (Signed)
Patient ID: Janet Mitchell, female   DOB: 1965/04/16, 54 y.o.   MRN: [id number]       Subjective: Doing well.  No complaints.  Hopeful to go home today.  No abdominal pain.  Tolerating a regular diet.  ROS: See above, otherwise other systems negative  Objective: Vital signs in last 24 hours: Temp:  [98 F (36.7 C)-99.2 F (37.3 C)] 99.2 F (37.3 C) (05/04 0700) Pulse Rate:  [78-95] 85 (05/04 0700) Resp:  [17-24] 18 (05/04 0700) BP: (150-178)/(56-97) 159/71 (05/04 0700) SpO2:  [92 %-100 %] 100 % (05/04 0700) Weight:  [121.8 kg] 121.8 kg (05/03 1105) Last BM Date: 07/30/19  Intake/Output from previous day: 05/03 0701 - 05/04 0700 In: -  Out: 3500  Intake/Output this shift: No intake/output data recorded.  PE: Abd: soft, NT, Nd, PD cath in place and clamped off.  Lab Results:  Recent Labs    07/29/19 0224 07/30/19 0502  WBC 11.4* 9.9  HGB 8.5* 8.7*  HCT 26.8* 27.9*  PLT 202 189   BMET Recent Labs    07/29/19 0224 07/30/19 0502  NA 136 136  K 4.5 4.0  CL 96* 96*  CO2 28 29  GLUCOSE 139* 103*  BUN 26* 16  CREATININE 5.77* 4.58*  CALCIUM 9.0 9.2   PT/INR No results for input(s): LABPROT, INR in the last 72 hours. CMP     Component Value Date/Time   NA 136 07/30/2019 0502   K 4.0 07/30/2019 0502   CL 96 (L) 07/30/2019 0502   CO2 29 07/30/2019 0502   GLUCOSE 103 (H) 07/30/2019 0502   BUN 16 07/30/2019 0502   CREATININE 4.58 (H) 07/30/2019 0502   CALCIUM 9.2 07/30/2019 0502   PROT 7.2 07/30/2019 0502   ALBUMIN 2.4 (L) 07/30/2019 0502   AST 13 (L) 07/30/2019 0502   ALT 5 07/30/2019 0502   ALKPHOS 63 07/30/2019 0502   BILITOT 0.9 07/30/2019 0502   GFRNONAA 10 (L) 07/30/2019 0502   GFRAA 12 (L) 07/30/2019 0502   Lipase     Component Value Date/Time   LIPASE 19 12/18/2018 0037       Studies/Results: No results found.  Anti-infectives: Anti-infectives (From admission, onward)   None       Assessment/Plan HTN HLD IDDM ESRD on HD  from [facility] since 07/17/19 Obesity Interstitial lung disease, recent covid 02/2019 GERD  Intraabdominal fluid collection concerning for pseudocyst with internal hemorrhage Acute blood loss anemia -s/p 3 units PRBCs 5/1 and 1 unit 5/2 - Hgb stable at between 8.5-9.0 - hemodynamically stable and abdomen nontender - no role for surgical intervention, patient is surgically stable for DC home when felt medically appropriate -we will sign off at this time  ID -none VTE -SCDs only FEN -HH/CM diet Foley -none  LOS: 4 days    Henreitta Cea , [facility] Surgery 07/30/2019, 8:22 AM Please see Amion for pager number during day hours 7:00am-4:30pm or 7:00am -11:30am on weekends

## 2019-07-30 NOTE — Discharge Summary (Signed)
[name] ZSM:[id number] DOB: [birth year] DOA: 07/26/2019  PCP: Lucianne Lei, MD  Admit date: 07/26/2019  Discharge date: 07/30/2019  Admitted From: Home   Disposition:  Home   Recommendations for Outpatient Follow-up:   Follow up with PCP in 1-2 weeks  PCP Please obtain BMP/CBC, 2 view CXR in 1week,  (see Discharge instructions)   PCP Please follow up on the following pending results:    Home Health: HHPT   Equipment/Devices: None  Consultations: CCS, Renal Discharge Condition: Stable    CODE STATUS: Full    Diet Recommendation: Renal-low carbohydrate, 1.2 L fluid restriction per day    Chief Complaint  Patient presents with  . Abnormal Lab     Brief history of present illness from the day of admission and additional interim summary    Janet Mitchell a53 y.o.female,with past medical history of end-stage renal disease on peritoneal hemodialysissince 2017, recently transition to hemodialysis 4/21, hypertension, diabetes mellitus type 2,patient with rehospitalization at [facility] secondary to peritoneal dialysis catheter peritonitis, which was treated, then she was started on HD first session as an outpatient with this Monday, she presented to dialysis, her repeat CBC came back significant for drop in hemoglobin to 4.3, work-up at [facility], ER suggested that she had intra-abdominal bleed around the peritoneal dialysis catheter.  Was transferred to [facility] where general surgery and nephrology were called, she is receiving blood transfusions and being managed here.                                                                 Hospital Course     1.  Acute blood loss related acute on chronic anemia.  Patient appears to have intra-abdominal cyst with bleeding around the  peritoneal catheter site, general surgery & nephrology following.  Total 4 units of packed RBC given this admission last packed RBC afternoon of 07/28/2019, H&H now largely stable when accounted for heme dilution,  had by general surgery for home discharge and patient is symptom-free, do not think she has any active ongoing bleed.  Post dialysis today if stable she will be discharged home.  2.  ESRD, now on HD.  Initially was on peritoneal diagnosis which was complicated by peritonitis and now seems to have blood around the PD catheter.  Has a tunneled catheter in the Right IJ, nephrology following, continue HD per dialysis protocol.  Is being dialyzed today prior to discharge.  3. Questionable changes on CT scan suggestive of possible ILD.  Outpatient follow-up with pulmonary once discharged.  4.  Morbid obesity BMI 41 follow with PCP for weight loss.  5.  GERD.  Continue PPI.  6.  Hypertension.    Will resume home regimen follow with PCP..  7.  HX of chronic diarrhea and migraine headaches.  Supportive care.    8. DM2 - continue home regimen.  Lab Results  Component Value Date   HGBA1C 7.2 (H) 07/08/2019      Discharge diagnosis     Active Problems:   ESRD on peritoneal dialysis [facility])   Uncontrolled type 2 diabetes mellitus ([place])   Class 2 severe obesity due to excess calories with serious comorbidity and body mass index (BMI) of 37.0 to 37.9 in adult [facility])   ESRD (end stage renal disease) ([place])   Acute blood loss anemia    Discharge instructions    Discharge Instructions    Discharge instructions   Complete by: As directed    Follow with Primary MD Lucianne Lei, MD in 7 days   Get CBC, CMP checked next visit within 1 week by Primary MD   Activity: As tolerated with Full fall precautions use walker/cane & assistance as needed  Disposition Home   Diet: Renal, low carbohydrate diet.  1.5 L fluid restriction per day.  Special Instructions: If you have smoked or  chewed Tobacco  in the last 2 yrs please stop smoking, stop any regular Alcohol  and or any Recreational drug use.  On your next visit with your primary care physician please Get Medicines reviewed and adjusted.  Please request your Prim.MD to go over all Hospital Tests and Procedure/Radiological results at the follow up, please get all Hospital records sent to your Prim MD by signing hospital release before you go home.  If you experience worsening of your admission symptoms, develop shortness of breath, life threatening emergency, suicidal or homicidal thoughts you must seek medical attention immediately by calling 911 or calling your MD immediately  if symptoms less severe.  You Must read complete instructions/literature along with all the possible adverse reactions/side effects for all the Medicines you take and that have been prescribed to you. Take any new Medicines after you have completely understood and accpet all the possible adverse reactions/side effects.   Increase activity slowly   Complete by: As directed       Discharge Medications   Allergies as of 07/30/2019   No Known Allergies     Medication List    STOP taking these medications   calcium acetate 667 MG capsule Commonly known as: PHOSLO   Ipratropium-Albuterol 20-100 MCG/ACT Aers respimat Commonly known as: COMBIVENT     TAKE these medications   Accu-Chek Guide test strip Generic drug: glucose blood Use as instructed   Accu-Chek Guide w/Device Kit 1 Piece by Does not apply route as directed.   aspirin EC 81 MG tablet Take 1 tablet (81 mg total) by mouth daily. Start taking on: Aug 06, 2019 What changed: These instructions start on Aug 06, 2019. If you are unsure what to do until then, ask your doctor or other care provider.   calcitRIOL 0.5 MCG capsule Commonly known as: ROCALTROL Take 0.5-1 mcg by mouth See admin instructions. 0.50mg on Mondays through Fridays and take 114m on Saturdays and Sundays     HYDROcodone-acetaminophen 5-325 MG tablet Commonly known as: Norco Take 1 tablet by mouth every 6 (six) hours as needed for moderate pain.   labetalol 300 MG tablet Commonly known as: NORMODYNE Take 300 mg by mouth 2 (two) times daily.   losartan 100 MG tablet Commonly known as: COZAAR Take 100 mg by mouth daily.   multivitamin Tabs tablet Take 1 tablet by mouth at bedtime.   Toujeo Max SoloStar 300 UNIT/ML Solostar Pen Generic drug: insulin glargine (  2 Unit Dial) Inject 10 Units into the skin daily.   Tylenol 325 MG tablet Generic drug: acetaminophen Take 650 mg by mouth every 6 (six) hours as needed (pain).       Follow-up Information    Lucianne Lei, MD. Schedule an appointment as soon as possible for a visit in 1 week(s).   Specialty: Family Medicine Contact information: [address] [place] [address] [phone]        [facility], [place]. Schedule an appointment as soon as possible for a visit in 1 week(s).   Specialty: General Surgery Why: Intra-abdominal cyst with hemorrhage Contact information: [address] [place] [phone]          Major procedures and Radiology Reports - PLEASE review detailed and final reports thoroughly  -        CT ABDOMEN PELVIS WO CONTRAST  Result Date: 07/09/2019 CLINICAL DATA:  Abdomen pain EXAM: CT ABDOMEN AND PELVIS WITHOUT CONTRAST TECHNIQUE: Multidetector CT imaging of the abdomen and pelvis was performed following the standard protocol without IV contrast. COMPARISON:  05/27/2019, 04/08/2019 FINDINGS: Lower chest: Lung bases demonstrate heterogeneous mostly peripheral opacities likely scarring from prior COVID pneumonia. No consolidation or pleural effusion. Coronary vascular calcification. Mild cardiomegaly. Hepatobiliary: No focal liver abnormality is seen. Status post cholecystectomy. No biliary dilatation. Pancreas: Unremarkable. No pancreatic  ductal dilatation or surrounding inflammatory changes. Spleen: Normal in size without focal abnormality. Adrenals/Urinary Tract: Adrenal glands are slightly thickened but without dominant mass. Atrophic kidneys with extensive vascular calcification. No hydronephrosis. Unremarkable urinary bladder Stomach/Bowel: Stomach is within normal limits. Appendix appears normal. No evidence of bowel wall thickening, distention, or inflammatory changes. Vascular/Lymphatic: Extensive aortic atherosclerosis. No aneurysm. No suspicious adenopathy Reproductive: IUD in the uterus Other: No free air. Moderate to large loculated ascites within the anterior abdominal cavity. Peritoneal dialysis catheter coiled within the right anterior abdomen. Musculoskeletal: No acute or suspicious osseous abnormality. IMPRESSION: 1. Moderate to large volume of loculated appearing ascites within the anterior abdominal cavity 2. Heterogenous peripheral densities at the bases felt secondary to prior COVID pneumonia and scarring 3. Cardiomegaly.  Anasarca.  Atrophic native kidneys Electronically Signed   By: Donavan Foil M.D.   On: 07/09/2019 03:22   CT ABDOMEN PELVIS W CONTRAST  Result Date: 07/26/2019 CLINICAL DATA:  Anemia, dialysis EXAM: CT ABDOMEN AND PELVIS WITH CONTRAST TECHNIQUE: Multidetector CT imaging of the abdomen and pelvis was performed using the standard protocol following bolus administration of intravenous contrast. CONTRAST:  147m OMNIPAQUE IOHEXOL 300 MG/ML  SOLN COMPARISON:  07/09/2019, 05/27/2019 FINDINGS: Lower chest: There is extensive bilateral ground-glass and irregular interstitial opacity in the included lung bases. Cardiomegaly. Coronary artery calcifications. Hepatobiliary: No focal liver abnormality is seen. Status post cholecystectomy. No biliary dilatation. Pancreas: Unremarkable. No pancreatic ductal dilatation or surrounding inflammatory changes. Spleen: Normal in size without significant abnormality.  Adrenals/Urinary Tract: Adrenal glands are unremarkable. Atrophic appearing kidneys. No hydronephrosis. Bladder is unremarkable. Stomach/Bowel: Stomach is within normal limits. Appendix appears normal. No evidence of bowel wall thickening, distention, or inflammatory changes. Vascular/Lymphatic: Aortic atherosclerosis. Extensive vascular calcinosis. No enlarged abdominal or pelvic lymph nodes. Reproductive: No mass or other significant abnormality. Other: Anasarca. Interval enlargement of a large, loculated fluid collection in the ventral abdomen, measuring approximately 29.0 x 14.0 x 21.0 cm, now containing extensive internal heterogeneous attenuation. There is a tunneled Tenckhoff type peritoneal dialysis catheter within this collection. Musculoskeletal: Plate and screw fixation of the left  acetabulum. IMPRESSION: 1. Interval enlargement of a large, loculated fluid collection in the ventral abdomen, measuring approximately 29.0 x 14.0 x 21.0 cm, now containing extensive internal heterogeneous attenuation. This appearance is most consistent with a pseudocyst in the setting of peritoneal dialysis and concerning for internal hemorrhage given report of anemia. 2. Extensive bilateral ground-glass and irregular interstitial opacity in the included bilateral lung bases, similar to prior examination and concerning for fibrotic interstitial lung disease although incompletely characterized. Consider dedicated ILD protocol imaging of the chest on a nonemergent basis. 3. Other chronic and incidental findings as above. Aortic Atherosclerosis (ICD10-I70.0). Electronically Signed   By: Eddie Candle M.D.   On: 07/26/2019 16:41   IR Fluoro Guide CV Line Right  Result Date: 07/17/2019 CLINICAL DATA:  End-stage renal disease. Recent removal of peritoneal dialysis catheter. Hemodialysis access required EXAM: TUNNELED HEMODIALYSIS CATHETER PLACEMENT WITH ULTRASOUND AND FLUOROSCOPIC GUIDANCE TECHNIQUE: The procedure, risks,  benefits, and alternatives were explained to the patient. Questions regarding the procedure were encouraged and answered. The patient understands and consents to the procedure. As antibiotic prophylaxis, cefazolin 3 g was ordered pre-procedure and administered intravenously within one hour of incision.Patency of the right IJ vein was confirmed with ultrasound with image documentation. An appropriate skin site was determined. Region was prepped using maximum barrier technique including cap and mask, sterile gown, sterile gloves, large sterile sheet, and Chlorhexidine as cutaneous antisepsis. The region was infiltrated locally with 1% lidocaine. Intravenous Fentanyl 31mg and Versed 16mwere administered as conscious sedation during continuous monitoring of the patient's level of consciousness and physiological / cardiorespiratory status by the radiology RN, with a total moderate sedation time of 15 minutes. Under real-time ultrasound guidance, the right IJ vein was accessed with a 21 gauge micropuncture needle; the needle tip within the vein was confirmed with ultrasound image documentation. Needle exchanged over the 018 guidewire for transitional dilator, which allowed advancement of a Benson wire into the IVC. Over this, an MPA catheter was advanced. A Palindrome 19 hemodialysis catheter was tunneled from the right anterior chest wall approach to the right IJ dermatotomy site. The MPA catheter was exchanged over an Amplatz wire for serial vascular dilators which allow placement of a peel-away sheath, through which the catheter was advanced under intermittent fluoroscopy, positioned with its tips in the proximal and midright atrium. Spot chest radiograph confirms good catheter position. No pneumothorax. Catheter was flushed and primed per protocol. Catheter secured externally with O Prolene sutures. The right IJ dermatotomy site was closed with Dermabond. COMPLICATIONS: COMPLICATIONS None immediate FLUOROSCOPY TIME:   0.5 minutes; 70 uGym2 DAP COMPARISON:  None IMPRESSION: 1. Technically successful placement of tunneled right IJ hemodialysis catheter with ultrasound and fluoroscopic guidance. Ready for routine use. ACCESS: Remains approachable for percutaneous intervention as needed. Electronically Signed   By: D Lucrezia Europe.D.   On: 07/17/2019 15:43   IR USKoreauide Vasc Access Right  Result Date: 07/17/2019 CLINICAL DATA:  End-stage renal disease. Recent removal of peritoneal dialysis catheter. Hemodialysis access required EXAM: TUNNELED HEMODIALYSIS CATHETER PLACEMENT WITH ULTRASOUND AND FLUOROSCOPIC GUIDANCE TECHNIQUE: The procedure, risks, benefits, and alternatives were explained to the patient. Questions regarding the procedure were encouraged and answered. The patient understands and consents to the procedure. As antibiotic prophylaxis, cefazolin 3 g was ordered pre-procedure and administered intravenously within one hour of incision.Patency of the right IJ vein was confirmed with ultrasound with image documentation. An appropriate skin site was determined. Region was prepped using maximum barrier technique including cap and  mask, sterile gown, sterile gloves, large sterile sheet, and Chlorhexidine as cutaneous antisepsis. The region was infiltrated locally with 1% lidocaine. Intravenous Fentanyl 58mg and Versed 160mwere administered as conscious sedation during continuous monitoring of the patient's level of consciousness and physiological / cardiorespiratory status by the radiology RN, with a total moderate sedation time of 15 minutes. Under real-time ultrasound guidance, the right IJ vein was accessed with a 21 gauge micropuncture needle; the needle tip within the vein was confirmed with ultrasound image documentation. Needle exchanged over the 018 guidewire for transitional dilator, which allowed advancement of a Benson wire into the IVC. Over this, an MPA catheter was advanced. A Palindrome 19 hemodialysis catheter  was tunneled from the right anterior chest wall approach to the right IJ dermatotomy site. The MPA catheter was exchanged over an Amplatz wire for serial vascular dilators which allow placement of a peel-away sheath, through which the catheter was advanced under intermittent fluoroscopy, positioned with its tips in the proximal and midright atrium. Spot chest radiograph confirms good catheter position. No pneumothorax. Catheter was flushed and primed per protocol. Catheter secured externally with O Prolene sutures. The right IJ dermatotomy site was closed with Dermabond. COMPLICATIONS: COMPLICATIONS None immediate FLUOROSCOPY TIME:  0.5 minutes; 70 uGym2 DAP COMPARISON:  None IMPRESSION: 1. Technically successful placement of tunneled right IJ hemodialysis catheter with ultrasound and fluoroscopic guidance. Ready for routine use. ACCESS: Remains approachable for percutaneous intervention as needed. Electronically Signed   By: D Lucrezia Europe.D.   On: 07/17/2019 15:43   DG Chest Port 1 View  Result Date: 07/09/2019 CLINICAL DATA:  Short of breath EXAM: PORTABLE CHEST 1 VIEW COMPARISON:  04/02/2019 FINDINGS: Low lung volumes. Cardiomegaly. Diffuse bilateral reticular and ground-glass opacity. No pleural effusion or pneumothorax. IMPRESSION: Cardiomegaly with mild diffuse fine reticular and ground-glass opacity similar compared to prior, question underlying chronic interstitial lung disease. Electronically Signed   By: KiDonavan Foil.D.   On: 07/09/2019 00:44   ECHOCARDIOGRAM COMPLETE  Result Date: 07/09/2019    ECHOCARDIOGRAM REPORT   [facility]/2019 Medical Rec #:  [id number]        Height:       68.0 in Accession #:    [id number]       Weight:       250.7 lb Date of Birth:  9/April 16, 1965         BSA:          2.250 m Patient Age:    [id number]           BP:           161/73 mmHg Patient Gender: F                  HR:           76 bpm. Exam Location:  Inpatient  Procedure: 2D Echo, Cardiac Doppler and Color Doppler Indications:    I50.23 Acute on chronic systolic (congestive) heart failure  History:        Patient has no prior history of Echocardiogram examinations.                 Risk Factors:Hypertension and Diabetes. ESRD. COVID-19.  Sonographer:    TiJonelle Sidleance Referring Phys: [id number]. Left ventricular ejection fraction, by estimation, is 50 to 55%. The left ventricle has normal function. The left ventricle demonstrates regional  wall motion abnormalities (see scoring diagram/findings for description). There is moderate concentric left ventricular hypertrophy. Left ventricular diastolic parameters are consistent with Grade II diastolic dysfunction (pseudonormalization). Elevated left atrial pressure. There is mild hypokinesis of the left ventricular, entire inferolateral wall.  2. Right ventricular systolic function is mildly reduced. The right ventricular size is normal. There is moderately elevated pulmonary artery systolic pressure.  3. Left atrial size was moderately dilated.  4. Right atrial size was mildly dilated.  5. The mitral valve is normal in structure. Trivial mitral valve regurgitation.  6. Tricuspid valve regurgitation is mild to moderate.  7. The aortic valve is normal in structure. Aortic valve regurgitation is trivial.  8. The inferior vena cava is dilated in size with >50% respiratory variability, suggesting right atrial pressure of 8 mmHg. FINDINGS  Left Ventricle: Left ventricular ejection fraction, by estimation, is 50 to 55%. The left ventricle has normal function. The left ventricle demonstrates regional wall motion abnormalities. Mild hypokinesis of the left ventricular, entire inferolateral wall. The left ventricular internal cavity size was normal in size. There is moderate concentric left ventricular hypertrophy. Left ventricular diastolic parameters are consistent with Grade II diastolic dysfunction  (pseudonormalization). Elevated left atrial pressure. Right Ventricle: The right ventricular size is normal. No increase in right ventricular wall thickness. Right ventricular systolic function is mildly reduced. There is moderately elevated pulmonary artery systolic pressure. The tricuspid regurgitant velocity is 2.91 m/s, and with an assumed right atrial pressure of 8 mmHg, the estimated right ventricular systolic pressure is 33.2 mmHg. Left Atrium: Left atrial size was moderately dilated. Right Atrium: Right atrial size was mildly dilated. Pericardium: There is no evidence of pericardial effusion. Mitral Valve: The mitral valve is normal in structure. Mild mitral annular calcification. Trivial mitral valve regurgitation. Tricuspid Valve: The tricuspid valve is normal in structure. Tricuspid valve regurgitation is mild to moderate. Aortic Valve: The aortic valve is normal in structure. Aortic valve regurgitation is trivial. Pulmonic Valve: The pulmonic valve was normal in structure. Pulmonic valve regurgitation is not visualized. Aorta: The aortic root and ascending aorta are structurally normal, with no evidence of dilitation. Venous: The inferior vena cava is dilated in size with greater than 50% respiratory variability, suggesting right atrial pressure of 8 mmHg. IAS/Shunts: No atrial level shunt detected by color flow Doppler.  LEFT VENTRICLE PLAX 2D LVIDd:         5.30 cm  Diastology LVIDs:         3.70 cm  LV e' lateral:   8.05 cm/s LV PW:         1.50 cm  LV E/e' lateral: 16.8 LV IVS:        1.30 cm  LV e' medial:    5.22 cm/s LVOT diam:     1.90 cm  LV E/e' medial:  25.9 LV SV:         57 LV SV Index:   25 LVOT Area:     2.84 cm  RIGHT VENTRICLE            IVC RV Basal diam:  3.50 cm    IVC diam: 2.20 cm RV Mid diam:    2.10 cm RV S prime:     9.03 cm/s TAPSE (M-mode): 2.0 cm LEFT ATRIUM              Index       RIGHT ATRIUM           Index LA diam:  4.80 cm  2.13 cm/m  RA Area:     19.00 cm LA  Vol (A2C):   107.0 ml 47.56 ml/m RA Volume:   54.50 ml  24.23 ml/m LA Vol (A4C):   77.4 ml  34.40 ml/m LA Biplane Vol: 94.9 ml  42.18 ml/m  AORTIC VALVE LVOT Vmax:   83.00 cm/s LVOT Vmean:  61.400 cm/s LVOT VTI:    0.200 m  AORTA Ao Root diam: 3.10 cm Ao Asc diam:  3.15 cm MITRAL VALVE                TRICUSPID VALVE MV Area (PHT): 4.21 cm     TR Peak grad:   33.9 mmHg MV Decel Time: 180 msec     TR Vmax:        291.00 cm/s MV E velocity: 135.00 cm/s MV A velocity: 79.60 cm/s   SHUNTS MV E/A ratio:  1.70         Systemic VTI:  0.20 m                             Systemic Diam: 1.90 cm Dani Gobble Croitoru MD Electronically signed by Sanda Klein MD Signature Date/Time: 07/09/2019/1:54:09 PM    Final    VAS [place] UPPER EXT VEIN MAPPING (PRE-OP AVF)  Result Date: 07/18/2019 UPPER EXTREMITY VEIN MAPPING  Indications: Pre-access. Comparison Study: No prior studies. Performing Technologist: Oliver Hum RVT  Examination Guidelines: A complete evaluation includes B-mode imaging, spectral Doppler, color Doppler, and power Doppler as needed of all accessible portions of each vessel. Bilateral testing is considered an integral part of a complete examination. Limited examinations for reoccurring indications may be performed as noted. +-----------------+-------------+----------+--------------+ Right Cephalic   Diameter (cm)Depth (cm)   Findings    +-----------------+-------------+----------+--------------+ Shoulder             0.32        1.30                  +-----------------+-------------+----------+--------------+ Prox upper arm       0.52        0.75     branching    +-----------------+-------------+----------+--------------+ Mid upper arm        0.48        0.93                  +-----------------+-------------+----------+--------------+ Dist upper arm       0.36        0.49     branching    +-----------------+-------------+----------+--------------+ Antecubital fossa    0.52        0.23       Thrombus    +-----------------+-------------+----------+--------------+ Prox forearm         0.29        0.75     branching    +-----------------+-------------+----------+--------------+ Mid forearm          0.31        0.49                  +-----------------+-------------+----------+--------------+ Dist forearm                            not visualized +-----------------+-------------+----------+--------------+ +-----------------+-------------+----------+---------+ Right Basilic    Diameter (cm)Depth (cm)Findings  +-----------------+-------------+----------+---------+ Shoulder             0.58        1.30             +-----------------+-------------+----------+---------+  Mid upper arm        0.40        1.50             +-----------------+-------------+----------+---------+ Dist upper arm       0.52        1.10             +-----------------+-------------+----------+---------+ Antecubital fossa    0.27        0.74   branching +-----------------+-------------+----------+---------+ Prox forearm         0.23        0.43             +-----------------+-------------+----------+---------+ Mid forearm          0.26        0.17             +-----------------+-------------+----------+---------+ Distal forearm       0.29        0.14             +-----------------+-------------+----------+---------+ +-----------------+-------------+----------+----------------------+ Left Cephalic    Diameter (cm)Depth (cm)       Findings        +-----------------+-------------+----------+----------------------+ Shoulder             0.33        0.87                          +-----------------+-------------+----------+----------------------+ Prox upper arm       0.26        1.30         branching        +-----------------+-------------+----------+----------------------+ Mid upper arm        0.24        0.98                           +-----------------+-------------+----------+----------------------+ Dist upper arm       0.21        0.60         branching        +-----------------+-------------+----------+----------------------+ Antecubital fossa    0.32        0.28                          +-----------------+-------------+----------+----------------------+ Prox forearm         0.49        0.84   branching and Thrombus +-----------------+-------------+----------+----------------------+ Mid forearm                                 not visualized     +-----------------+-------------+----------+----------------------+ Dist forearm         0.21        0.31                          +-----------------+-------------+----------+----------------------+ +-----------------+-------------+----------+---------+ Left Basilic     Diameter (cm)Depth (cm)Findings  +-----------------+-------------+----------+---------+ Shoulder             0.68        1.50             +-----------------+-------------+----------+---------+ Mid upper arm        0.46        1.40             +-----------------+-------------+----------+---------+ Dist upper arm  0.57        1.20             +-----------------+-------------+----------+---------+ Antecubital fossa    0.43        0.68   branching +-----------------+-------------+----------+---------+ Prox forearm         0.41        0.55   branching +-----------------+-------------+----------+---------+ Mid forearm          0.33        0.60   branching +-----------------+-------------+----------+---------+ Distal forearm       0.24        0.44             +-----------------+-------------+----------+---------+ *See table(s) above for measurements and observations.  Diagnosing physician: Monica Martinez MD Electronically signed by Monica Martinez MD on 07/18/2019 at 8:13:03 PM.    Final    IR Paracentesis  Result Date: 07/09/2019 INDICATION: Patient with history of  end stage renal disease admitted with abdominal pain and ascites presents for diagnostic paracentesis maximum 100 ml per Team's request. EXAM: ULTRASOUND GUIDED DIAGNOSTIC PARACENTESIS MEDICATIONS: Lidocaine 1% 10 mL COMPLICATIONS: None immediate. PROCEDURE: Informed written consent was obtained from the patient after a discussion of the risks, benefits and alternatives to treatment. A timeout was performed prior to the initiation of the procedure. Initial ultrasound scanning demonstrates a large amount of ascites within the right lower abdominal quadrant. The right lower abdomen was prepped and draped in the usual sterile fashion. 1% lidocaine was used for local anesthesia. Following this, a 19 gauge, 10-cm, Yueh catheter was introduced. An ultrasound image was saved for documentation purposes. The paracentesis was performed. The catheter was removed and a dressing was applied. The patient tolerated the procedure well without immediate post procedural complication. FINDINGS: A total of approximately 100 m of clear fluid was removed. Samples were sent to the laboratory as requested by the clinical team. IMPRESSION: Successful ultrasound-guided therapeutic paracentesis yielding 100 mililiters of peritoneal fluid. Read by Rushie Nyhan NP Electronically Signed   By: Markus Daft M.D.   On: 07/09/2019 09:12    Micro Results     Recent Results (from the past 240 hour(s))  Respiratory Panel by RT PCR (Flu A&B, Covid) - Nasopharyngeal Swab     Status: None   Collection Time: 07/26/19  7:00 PM   Specimen: Nasopharyngeal Swab  Result Value Ref Range Status   SARS Coronavirus 2 by RT PCR NEGATIVE NEGATIVE Final    Comment: (NOTE) SARS-CoV-2 target nucleic acids are NOT DETECTED. The SARS-CoV-2 RNA is generally detectable in upper respiratoy specimens during the acute phase of infection. The lowest concentration of SARS-CoV-2 viral copies this assay can detect is 131 copies/mL. A negative result does not  preclude SARS-Cov-2 infection and should not be used as the sole basis for treatment or other patient management decisions. A negative result may occur with  improper specimen collection/handling, submission of specimen other than nasopharyngeal swab, presence of viral mutation(s) within the areas targeted by this assay, and inadequate number of viral copies (<131 copies/mL). A negative result must be combined with clinical observations, patient history, and epidemiological information. The expected result is Negative. Fact Sheet for Patients:  PinkCheek.be Fact Sheet for Healthcare Providers:  GravelBags.it This test is not yet ap proved or cleared by the [place] FDA and  has been authorized for detection and/or diagnosis of SARS-CoV-2 by FDA under an Emergency Use Authorization (EUA). This EUA will remain  in effect (meaning this test can be used)  for the duration of the COVID-19 declaration under Section 564(b)(1) of the Act, 21 U.S.C. section 360bbb-3(b)(1), unless the authorization is terminated or revoked sooner.    Influenza A by PCR NEGATIVE NEGATIVE Final   Influenza B by PCR NEGATIVE NEGATIVE Final    Comment: (NOTE) The Xpert Xpress SARS-CoV-2/FLU/RSV assay is intended as an aid in  the diagnosis of influenza from Nasopharyngeal swab specimens and  should not be used as a sole basis for treatment. Nasal washings and  aspirates are unacceptable for Xpert Xpress SARS-CoV-2/FLU/RSV  testing. Fact Sheet for Patients: PinkCheek.be Fact Sheet for Healthcare Providers: GravelBags.it This test is not yet approved or cleared by the [place] FDA and  has been authorized for detection and/or diagnosis of SARS-CoV-2 by  FDA under an Emergency Use Authorization (EUA). This EUA will remain  in effect (meaning this test can be used) for the duration of the    Covid-19 declaration under Section 564(b)(1) of the Act, 21  U.S.C. section 360bbb-3(b)(1), unless the authorization is  terminated or revoked. Performed at [facility], [address], [place], [place] [address]     Today   Subjective    Janet Mitchell today has no headache,no chest abdominal pain,no new weakness tingling or numbness, feels much better wants to go home today.     Objective   Blood pressure (!) 159/81, pulse 88, temperature 98 F (36.7 C), temperature source Oral, resp. rate 20, height 5' 9.6" (1.768 m), weight 120.7 kg, SpO2 94 %.  No intake or output data in the 24 hours ending 07/30/19 1204  Exam  Awake Alert,   No new F.N deficits, Normal affect [place].AT,PERRAL Supple Neck,No JVD, No cervical lymphadenopathy appriciated.  Symmetrical Chest wall movement, Good air movement bilaterally, CTAB RRR,No Gallops,Rubs or new Murmurs, No Parasternal Heave +ve B.Sounds, Abd Soft, Non tender, No organomegaly appriciated, No rebound -guarding or rigidity. No Cyanosis, Clubbing or edema, No new Rash or bruise   Data Review   CBC w Diff:  Lab Results  Component Value Date   WBC 9.9 07/30/2019   HGB 8.7 (L) 07/30/2019   HCT 27.9 (L) 07/30/2019   PLT 189 07/30/2019   LYMPHOPCT 12 07/30/2019   MONOPCT 14 07/30/2019   EOSPCT 5 07/30/2019   BASOPCT 1 07/30/2019    CMP:  Lab Results  Component Value Date   NA 136 07/30/2019   K 4.0 07/30/2019   CL 96 (L) 07/30/2019   CO2 29 07/30/2019   BUN 16 07/30/2019   CREATININE 4.58 (H) 07/30/2019   PROT 7.2 07/30/2019   ALBUMIN 2.4 (L) 07/30/2019   BILITOT 0.9 07/30/2019   ALKPHOS 63 07/30/2019   AST 13 (L) 07/30/2019   ALT 5 07/30/2019  .   Total Time in preparing paper work, data evaluation and todays exam - 67 minutes  Lala Lund M.D on 07/30/2019 at 12:04 PM  [facility]   Office  [phone]

## 2019-07-30 NOTE — Progress Notes (Signed)
Patient ID: Janet Mitchell, female   DOB: [birth year], 54 y.o.   MRN: [id number]  [facility] Progress Note   Assessment/ Plan:   1.  Acute blood loss anemia: superimposed on chronic anemia of ESRD with bleeding around her PD catheter site based on CT scan findings.  This appears to have clinically stopped and hemoglobin/hematocrit improved commensurate with PRBC transfusion.  Seen by gen surg who felt pt may have pseudocyst w/ internal hemorrhage, no plans for surgical intervention and will likely need elective removal of PD catheter as an outpatient.  For DC today. I have updated the [facility] staff in [place] about pt's stay here.  2. ESRD: Recently converted to hemodialysis from peritoneal dialysis following presumptive membrane failure.  Had daily HD here w/ TDC and 9 L UF over 3 sessions. Vol overload sig better, still has some volume on board, have d/w her OP unit staff today.  Avoid heparin with dialysis.  Status post left first stage BBF on 07/22/2019.  3. CKD-MBD: Continue calcitriol for PTH suppression and check phosphorus with labs tomorrow.  Remains on renal diet. 4. Nutrition: Continue renal diet with ONS, renal multivitamin. 5. Hypertension: Blood pressure intermittently elevated but with some episodes of intradialytic blood pressure drop limiting ultrafiltration.  Significant volume overload noted.  DC'd amlodipine and added lisinopril and hydralazine.   Kelly Splinter, MD 07/30/2019, 3:40 PM    Subjective:   No new c/o's. No sig abd pain.    Objective:   BP (!) 145/74 (BP Location: Right Arm)   Pulse 88   Temp 98.7 F (37.1 C) (Oral)   Resp 20   Ht 5' 9.6" (1.768 m)   Wt 120.7 kg   SpO2 91%   BMI 38.62 kg/m   Physical Exam: Gen: Comfortably sitting up on the side of her bed CVS: Pulse regular rhythm, normal S1 and S2 Resp: Diminished breath sounds over bases, no distinct rales Abd: Soft, obese, tender around epigastric/periumbilical area, PD cath w/ some  old blood in the tube Ext: 2-3+ edema over lower extremities.  Left upper arm with palpable thrill over BBF  Labs: BMET Recent Labs  Lab 07/26/19 1407 07/27/19 0755 07/28/19 1113 07/29/19 0224 07/30/19 0502  NA 136 138 136 136 136  K 4.3 5.1 4.4 4.5 4.0  CL 98 100 97* 96* 96*  CO2 27 27 27 28 29   GLUCOSE 187* 143* 117* 139* 103*  BUN 34* 33* 19 26* 16  CREATININE 5.86* 6.49* 4.83* 5.77* 4.58*  CALCIUM 8.5* 9.1 8.9 9.0 9.2  PHOS  --   --  4.1  --   --    CBC Recent Labs  Lab 07/26/19 1407 07/27/19 0755 07/28/19 1113 07/28/19 1113 07/28/19 1649 07/28/19 2306 07/29/19 0224 07/30/19 0502  WBC 9.5   < > 10.5   < > 12.3* 12.4* 11.4* 9.9  NEUTROABS 5.8  --  7.5  --   --   --  8.2* 6.7  HGB 4.7*   < > 8.8*   < > 9.0* 9.0* 8.5* 8.7*  HCT 16.1*   < > 27.3*   < > 28.4* 28.4* 26.8* 27.9*  MCV 81.3   < > 81.5   < > 81.8 82.6 83.0 84.3  PLT 227   < > 207   < > 184 219 202 189   < > = values in this interval not displayed.     Medications:    . calcitRIOL  0.5 mcg Oral Once per  day on Mon Tue Wed Thu Fri  . calcitRIOL  1 mcg Oral Once per day on Sun Sat  . Chlorhexidine Gluconate Cloth  6 each Topical Daily  . Chlorhexidine Gluconate Cloth  6 each Topical Q0600  . hydrALAZINE  25 mg Oral Q8H  . insulin aspart  0-9 Units Subcutaneous Q6H  . lisinopril  20 mg Oral Daily  . multivitamin  1 tablet Oral QHS

## 2019-07-30 NOTE — Progress Notes (Signed)
Patient continues of complain of left foot discomfort, pain medication given, also gave PRN hydralazine for elevated  BP.

## 2019-07-30 NOTE — TOC Transition Note (Signed)
Transition of Care [facility]) - CM/SW Discharge Note   Patient Details  Name: Janet Mitchell MRN: [id number] Date of Birth: April 05, 1965  Transition of Care [facility]) CM/SW Contact:  Maryclare Labrador, RN Phone Number: 07/30/2019, 2:08 PM   Clinical Narrative:   Pt deemed stable for discharge home today.  Pt confirms she would like to remain with [facility] for PT and OT - agency aware pt will discharge home today.  NO other CM needs determined - CM signing off    Final next level of care: Home w Home Health Services Barriers to Discharge: Barriers Resolved   Patient Goals and CMS Choice   CMS Medicare.gov Compare Post Acute Care list provided to:: Patient Choice offered to / list presented to : Patient  Discharge Placement                       Discharge Plan and Services                          HH Arranged: PT, OT [facility] Agency: [place] ([facility]) Date [place]: 07/30/19 Time [place]: [place] Representative spoke with at [place]: [place] ([place]) Interventions     Readmission Risk Interventions Readmission Risk Prevention Plan 07/19/2019 07/12/2019 04/05/2019  Transportation Screening Complete Complete Complete  Medication Review Press photographer) Complete - Complete  PCP or Specialist appointment within 3-5 days of discharge Patient refused - -  [place] or Home Care Consult Complete - -  Palliative Care Screening Not Applicable - Not [place] Not Applicable Not Applicable Patient Refused  Some recent data might be hidden

## 2019-07-30 NOTE — Progress Notes (Signed)
PT Cancellation Note  Patient Details Name: Janet Mitchell MRN: [id number] DOB: 1966-02-03   Cancelled Treatment:    Reason Eval/Treat Not Completed: Other (comment) Pt declined PT due to fatigue from HD and wanting to safe energy to go home.  Discussed PT needed to assess to see if pt able to get up and move around home safely as she had difficulty at eval.  Pt reports she has been up to the chair and walking in room.  States she has necessary DME and strong family support at home.  Continued to politely decline PT. Will f/u as able.  Maggie Font, PT Acute Rehab Services Pager [phone] [facility] [phone]    Karlton Lemon 07/30/2019, 2:31 PM

## 2019-07-30 NOTE — Care Management Important Message (Signed)
Important Message  Patient Details  Name: Janet Mitchell MRN: [id number] Date of Birth: 17-Jul-1965   Medicare Important Message Given:  Yes - Important Message mailed due to current National Emergency  Verbal consent obtained due to current National Emergency  Relationship to patient: Self Contact Name: Sheena Pinnix Call Date: 07/30/19  Time: 1402 Phone: [phone] Outcome: Spoke with contact Important Message mailed to: Other (must enter comment)(patient declined copy of IM)    Cami Delawder P Armetta Henri 07/30/2019, 2:03 PM

## 2019-07-30 NOTE — Progress Notes (Signed)
Pt given discharge instructions, prescriptions, and care notes. Pt verbalized understanding AEB no further questions or concerns at this time. IV was discontinued, no redness, pain, or swelling noted at this time. Telemetry discontinued and Centralized Telemetry was notified. Pt left the floor via wheelchair with staff in stable condition. 

## 2019-07-30 NOTE — Progress Notes (Signed)
Rounded with patient and patient c/o foot aching, offer meds or heat pack, patient refused, will continue to monitor

## 2019-07-30 NOTE — Progress Notes (Signed)
Agree with previous assessment, Patient resting comfortably on left side, will continue to monitor.

## 2019-07-30 NOTE — Progress Notes (Signed)
Inpatient Rehabilitation Admissions Coordinator  I met with patient in hemodialysis. She wishes to discharge home with [facility] and her family support. Does not want to pursue CIR admit. I have notified TOC team of [place], [place], RN CM and acute team. We will sign off at this time.  Danne Baxter, RN, MSN Rehab Admissions Coordinator [phone] 07/30/2019 10:44 AM

## 2019-07-31 DIAGNOSIS — D631 Anemia in chronic kidney disease: Secondary | ICD-10-CM | POA: Diagnosis not present

## 2019-07-31 DIAGNOSIS — B957 Other staphylococcus as the cause of diseases classified elsewhere: Secondary | ICD-10-CM | POA: Diagnosis not present

## 2019-07-31 DIAGNOSIS — Z1611 Resistance to penicillins: Secondary | ICD-10-CM | POA: Diagnosis not present

## 2019-07-31 DIAGNOSIS — K659 Peritonitis, unspecified: Secondary | ICD-10-CM | POA: Diagnosis not present

## 2019-07-31 DIAGNOSIS — D509 Iron deficiency anemia, unspecified: Secondary | ICD-10-CM | POA: Diagnosis not present

## 2019-07-31 DIAGNOSIS — N186 End stage renal disease: Secondary | ICD-10-CM | POA: Diagnosis not present

## 2019-07-31 DIAGNOSIS — N2581 Secondary hyperparathyroidism of renal origin: Secondary | ICD-10-CM | POA: Diagnosis not present

## 2019-07-31 DIAGNOSIS — Z992 Dependence on renal dialysis: Secondary | ICD-10-CM | POA: Diagnosis not present

## 2019-07-31 LAB — TYPE AND SCREEN
ABO/RH(D): A POS
Antibody Screen: NEGATIVE
Donor AG Type: NEGATIVE
Donor AG Type: NEGATIVE
Donor AG Type: NEGATIVE
Donor AG Type: NEGATIVE
Donor AG Type: NEGATIVE
Unit division: 0
Unit division: 0
Unit division: 0
Unit division: 0
Unit division: 0
Unit division: 0

## 2019-07-31 LAB — BPAM RBC
Blood Product Expiration Date: 202105082359
Blood Product Expiration Date: 202105082359
Blood Product Expiration Date: 202105252359
Blood Product Expiration Date: 202105252359
Blood Product Expiration Date: 202105252359
Blood Product Expiration Date: 202105252359
ISSUE DATE / TIME: 202105010217
ISSUE DATE / TIME: 202105010503
ISSUE DATE / TIME: 202105011350
ISSUE DATE / TIME: 202105020126
ISSUE DATE / TIME: 202105020526
Unit Type and Rh: 6200
Unit Type and Rh: 6200
Unit Type and Rh: 6200
Unit Type and Rh: 6200
Unit Type and Rh: 6200
Unit Type and Rh: 6200

## 2019-08-02 ENCOUNTER — Other Ambulatory Visit (HOSPITAL_COMMUNITY)
Admission: RE | Admit: 2019-08-02 | Discharge: 2019-08-02 | Disposition: A | Discharge status: Home / Self Care | Payer: Medicare Other | Source: Other Acute Inpatient Hospital | Attending: Medical | Admitting: Medical

## 2019-08-02 DIAGNOSIS — N2581 Secondary hyperparathyroidism of renal origin: Secondary | ICD-10-CM | POA: Diagnosis not present

## 2019-08-02 DIAGNOSIS — D509 Iron deficiency anemia, unspecified: Secondary | ICD-10-CM | POA: Diagnosis not present

## 2019-08-02 DIAGNOSIS — D649 Anemia, unspecified: Secondary | ICD-10-CM | POA: Insufficient documentation

## 2019-08-02 DIAGNOSIS — K659 Peritonitis, unspecified: Secondary | ICD-10-CM | POA: Diagnosis not present

## 2019-08-02 DIAGNOSIS — Z992 Dependence on renal dialysis: Secondary | ICD-10-CM | POA: Diagnosis not present

## 2019-08-02 DIAGNOSIS — D631 Anemia in chronic kidney disease: Secondary | ICD-10-CM | POA: Diagnosis not present

## 2019-08-02 DIAGNOSIS — N186 End stage renal disease: Secondary | ICD-10-CM | POA: Diagnosis not present

## 2019-08-02 LAB — CBC
HCT: 27.4 % — ABNORMAL LOW (ref 36.0–46.0)
Hemoglobin: 8.3 g/dL — ABNORMAL LOW (ref 12.0–15.0)
MCH: 25.5 pg — ABNORMAL LOW (ref 26.0–34.0)
MCHC: 30.3 g/dL (ref 30.0–36.0)
MCV: 84 fL (ref 80.0–100.0)
Platelets: 185 10*3/uL (ref 150–400)
RBC: 3.26 MIL/uL — ABNORMAL LOW (ref 3.87–5.11)
RDW: 22.5 % — ABNORMAL HIGH (ref 11.5–15.5)
WBC: 9.4 10*3/uL (ref 4.0–10.5)
nRBC: 0 % (ref 0.0–0.2)

## 2019-08-05 DIAGNOSIS — Z992 Dependence on renal dialysis: Secondary | ICD-10-CM | POA: Diagnosis not present

## 2019-08-05 DIAGNOSIS — N2581 Secondary hyperparathyroidism of renal origin: Secondary | ICD-10-CM | POA: Diagnosis not present

## 2019-08-05 DIAGNOSIS — K659 Peritonitis, unspecified: Secondary | ICD-10-CM | POA: Diagnosis not present

## 2019-08-05 DIAGNOSIS — D509 Iron deficiency anemia, unspecified: Secondary | ICD-10-CM | POA: Diagnosis not present

## 2019-08-05 DIAGNOSIS — N186 End stage renal disease: Secondary | ICD-10-CM | POA: Diagnosis not present

## 2019-08-05 DIAGNOSIS — D631 Anemia in chronic kidney disease: Secondary | ICD-10-CM | POA: Diagnosis not present

## 2019-08-07 DIAGNOSIS — Z992 Dependence on renal dialysis: Secondary | ICD-10-CM | POA: Diagnosis not present

## 2019-08-07 DIAGNOSIS — K659 Peritonitis, unspecified: Secondary | ICD-10-CM | POA: Diagnosis not present

## 2019-08-07 DIAGNOSIS — N2581 Secondary hyperparathyroidism of renal origin: Secondary | ICD-10-CM | POA: Diagnosis not present

## 2019-08-07 DIAGNOSIS — N186 End stage renal disease: Secondary | ICD-10-CM | POA: Diagnosis not present

## 2019-08-07 DIAGNOSIS — D631 Anemia in chronic kidney disease: Secondary | ICD-10-CM | POA: Diagnosis not present

## 2019-08-07 DIAGNOSIS — D509 Iron deficiency anemia, unspecified: Secondary | ICD-10-CM | POA: Diagnosis not present

## 2019-08-09 DIAGNOSIS — N2581 Secondary hyperparathyroidism of renal origin: Secondary | ICD-10-CM | POA: Diagnosis not present

## 2019-08-09 DIAGNOSIS — Z992 Dependence on renal dialysis: Secondary | ICD-10-CM | POA: Diagnosis not present

## 2019-08-09 DIAGNOSIS — D631 Anemia in chronic kidney disease: Secondary | ICD-10-CM | POA: Diagnosis not present

## 2019-08-09 DIAGNOSIS — K659 Peritonitis, unspecified: Secondary | ICD-10-CM | POA: Diagnosis not present

## 2019-08-09 DIAGNOSIS — D509 Iron deficiency anemia, unspecified: Secondary | ICD-10-CM | POA: Diagnosis not present

## 2019-08-09 DIAGNOSIS — N186 End stage renal disease: Secondary | ICD-10-CM | POA: Diagnosis not present

## 2019-08-12 DIAGNOSIS — D631 Anemia in chronic kidney disease: Secondary | ICD-10-CM | POA: Diagnosis not present

## 2019-08-12 DIAGNOSIS — K659 Peritonitis, unspecified: Secondary | ICD-10-CM | POA: Diagnosis not present

## 2019-08-12 DIAGNOSIS — D509 Iron deficiency anemia, unspecified: Secondary | ICD-10-CM | POA: Diagnosis not present

## 2019-08-12 DIAGNOSIS — N2581 Secondary hyperparathyroidism of renal origin: Secondary | ICD-10-CM | POA: Diagnosis not present

## 2019-08-12 DIAGNOSIS — N186 End stage renal disease: Secondary | ICD-10-CM | POA: Diagnosis not present

## 2019-08-12 DIAGNOSIS — Z992 Dependence on renal dialysis: Secondary | ICD-10-CM | POA: Diagnosis not present

## 2019-08-14 DIAGNOSIS — Z992 Dependence on renal dialysis: Secondary | ICD-10-CM | POA: Diagnosis not present

## 2019-08-14 DIAGNOSIS — D631 Anemia in chronic kidney disease: Secondary | ICD-10-CM | POA: Diagnosis not present

## 2019-08-14 DIAGNOSIS — D509 Iron deficiency anemia, unspecified: Secondary | ICD-10-CM | POA: Diagnosis not present

## 2019-08-14 DIAGNOSIS — K659 Peritonitis, unspecified: Secondary | ICD-10-CM | POA: Diagnosis not present

## 2019-08-14 DIAGNOSIS — N2581 Secondary hyperparathyroidism of renal origin: Secondary | ICD-10-CM | POA: Diagnosis not present

## 2019-08-14 DIAGNOSIS — N186 End stage renal disease: Secondary | ICD-10-CM | POA: Diagnosis not present

## 2019-08-16 DIAGNOSIS — D631 Anemia in chronic kidney disease: Secondary | ICD-10-CM | POA: Diagnosis not present

## 2019-08-16 DIAGNOSIS — D509 Iron deficiency anemia, unspecified: Secondary | ICD-10-CM | POA: Diagnosis not present

## 2019-08-16 DIAGNOSIS — K659 Peritonitis, unspecified: Secondary | ICD-10-CM | POA: Diagnosis not present

## 2019-08-16 DIAGNOSIS — Z992 Dependence on renal dialysis: Secondary | ICD-10-CM | POA: Diagnosis not present

## 2019-08-16 DIAGNOSIS — N2581 Secondary hyperparathyroidism of renal origin: Secondary | ICD-10-CM | POA: Diagnosis not present

## 2019-08-16 DIAGNOSIS — N186 End stage renal disease: Secondary | ICD-10-CM | POA: Diagnosis not present

## 2019-08-19 DIAGNOSIS — N186 End stage renal disease: Secondary | ICD-10-CM | POA: Diagnosis not present

## 2019-08-19 DIAGNOSIS — D509 Iron deficiency anemia, unspecified: Secondary | ICD-10-CM | POA: Diagnosis not present

## 2019-08-19 DIAGNOSIS — D631 Anemia in chronic kidney disease: Secondary | ICD-10-CM | POA: Diagnosis not present

## 2019-08-19 DIAGNOSIS — K659 Peritonitis, unspecified: Secondary | ICD-10-CM | POA: Diagnosis not present

## 2019-08-19 DIAGNOSIS — N2581 Secondary hyperparathyroidism of renal origin: Secondary | ICD-10-CM | POA: Diagnosis not present

## 2019-08-19 DIAGNOSIS — Z992 Dependence on renal dialysis: Secondary | ICD-10-CM | POA: Diagnosis not present

## 2019-08-21 DIAGNOSIS — K659 Peritonitis, unspecified: Secondary | ICD-10-CM | POA: Diagnosis not present

## 2019-08-21 DIAGNOSIS — D631 Anemia in chronic kidney disease: Secondary | ICD-10-CM | POA: Diagnosis not present

## 2019-08-21 DIAGNOSIS — Z992 Dependence on renal dialysis: Secondary | ICD-10-CM | POA: Diagnosis not present

## 2019-08-21 DIAGNOSIS — N2581 Secondary hyperparathyroidism of renal origin: Secondary | ICD-10-CM | POA: Diagnosis not present

## 2019-08-21 DIAGNOSIS — N186 End stage renal disease: Secondary | ICD-10-CM | POA: Diagnosis not present

## 2019-08-21 DIAGNOSIS — D509 Iron deficiency anemia, unspecified: Secondary | ICD-10-CM | POA: Diagnosis not present

## 2019-08-22 ENCOUNTER — Other Ambulatory Visit: Payer: Self-pay | Admitting: *Deleted

## 2019-08-22 DIAGNOSIS — N186 End stage renal disease: Secondary | ICD-10-CM

## 2019-08-23 DIAGNOSIS — Z992 Dependence on renal dialysis: Secondary | ICD-10-CM | POA: Diagnosis not present

## 2019-08-23 DIAGNOSIS — D509 Iron deficiency anemia, unspecified: Secondary | ICD-10-CM | POA: Diagnosis not present

## 2019-08-23 DIAGNOSIS — N2581 Secondary hyperparathyroidism of renal origin: Secondary | ICD-10-CM | POA: Diagnosis not present

## 2019-08-23 DIAGNOSIS — K659 Peritonitis, unspecified: Secondary | ICD-10-CM | POA: Diagnosis not present

## 2019-08-23 DIAGNOSIS — D631 Anemia in chronic kidney disease: Secondary | ICD-10-CM | POA: Diagnosis not present

## 2019-08-23 DIAGNOSIS — N186 End stage renal disease: Secondary | ICD-10-CM | POA: Diagnosis not present

## 2019-08-24 DIAGNOSIS — N186 End stage renal disease: Secondary | ICD-10-CM | POA: Diagnosis not present

## 2019-08-24 DIAGNOSIS — Z89412 Acquired absence of left great toe: Secondary | ICD-10-CM | POA: Diagnosis not present

## 2019-08-24 DIAGNOSIS — K8010 Calculus of gallbladder with chronic cholecystitis without obstruction: Secondary | ICD-10-CM | POA: Diagnosis not present

## 2019-08-24 DIAGNOSIS — K529 Noninfective gastroenteritis and colitis, unspecified: Secondary | ICD-10-CM | POA: Diagnosis not present

## 2019-08-24 DIAGNOSIS — K219 Gastro-esophageal reflux disease without esophagitis: Secondary | ICD-10-CM | POA: Diagnosis not present

## 2019-08-24 DIAGNOSIS — I132 Hypertensive heart and chronic kidney disease with heart failure and with stage 5 chronic kidney disease, or end stage renal disease: Secondary | ICD-10-CM | POA: Diagnosis not present

## 2019-08-24 DIAGNOSIS — D631 Anemia in chronic kidney disease: Secondary | ICD-10-CM | POA: Diagnosis not present

## 2019-08-24 DIAGNOSIS — I700 Atherosclerosis of aorta: Secondary | ICD-10-CM | POA: Diagnosis not present

## 2019-08-24 DIAGNOSIS — J849 Interstitial pulmonary disease, unspecified: Secondary | ICD-10-CM | POA: Diagnosis not present

## 2019-08-24 DIAGNOSIS — D62 Acute posthemorrhagic anemia: Secondary | ICD-10-CM | POA: Diagnosis not present

## 2019-08-24 DIAGNOSIS — E1122 Type 2 diabetes mellitus with diabetic chronic kidney disease: Secondary | ICD-10-CM | POA: Diagnosis not present

## 2019-08-24 DIAGNOSIS — Z992 Dependence on renal dialysis: Secondary | ICD-10-CM | POA: Diagnosis not present

## 2019-08-24 DIAGNOSIS — Z7722 Contact with and (suspected) exposure to environmental tobacco smoke (acute) (chronic): Secondary | ICD-10-CM | POA: Diagnosis not present

## 2019-08-24 DIAGNOSIS — Z8616 Personal history of COVID-19: Secondary | ICD-10-CM | POA: Diagnosis not present

## 2019-08-24 DIAGNOSIS — Z9181 History of falling: Secondary | ICD-10-CM | POA: Diagnosis not present

## 2019-08-24 DIAGNOSIS — I509 Heart failure, unspecified: Secondary | ICD-10-CM | POA: Diagnosis not present

## 2019-08-24 DIAGNOSIS — Z6841 Body Mass Index (BMI) 40.0 and over, adult: Secondary | ICD-10-CM | POA: Diagnosis not present

## 2019-08-24 DIAGNOSIS — Z794 Long term (current) use of insulin: Secondary | ICD-10-CM | POA: Diagnosis not present

## 2019-08-24 DIAGNOSIS — K668 Other specified disorders of peritoneum: Secondary | ICD-10-CM | POA: Diagnosis not present

## 2019-08-24 DIAGNOSIS — M1990 Unspecified osteoarthritis, unspecified site: Secondary | ICD-10-CM | POA: Diagnosis not present

## 2019-08-26 DIAGNOSIS — N2581 Secondary hyperparathyroidism of renal origin: Secondary | ICD-10-CM | POA: Diagnosis not present

## 2019-08-26 DIAGNOSIS — K659 Peritonitis, unspecified: Secondary | ICD-10-CM | POA: Diagnosis not present

## 2019-08-26 DIAGNOSIS — Z992 Dependence on renal dialysis: Secondary | ICD-10-CM | POA: Diagnosis not present

## 2019-08-26 DIAGNOSIS — N186 End stage renal disease: Secondary | ICD-10-CM | POA: Diagnosis not present

## 2019-08-26 DIAGNOSIS — D631 Anemia in chronic kidney disease: Secondary | ICD-10-CM | POA: Diagnosis not present

## 2019-08-26 DIAGNOSIS — D509 Iron deficiency anemia, unspecified: Secondary | ICD-10-CM | POA: Diagnosis not present

## 2019-08-27 ENCOUNTER — Ambulatory Visit (HOSPITAL_COMMUNITY)
Admission: RE | Admit: 2019-08-27 | Discharge: 2019-08-27 | Disposition: A | Discharge status: Home / Self Care | Payer: Medicare Other | Source: Ambulatory Visit | Attending: Vascular Surgery | Admitting: Vascular Surgery

## 2019-08-27 ENCOUNTER — Other Ambulatory Visit: Payer: Self-pay

## 2019-08-27 ENCOUNTER — Ambulatory Visit (INDEPENDENT_AMBULATORY_CARE_PROVIDER_SITE_OTHER): Payer: Self-pay | Admitting: Physician Assistant

## 2019-08-27 DIAGNOSIS — N186 End stage renal disease: Secondary | ICD-10-CM

## 2019-08-27 NOTE — Progress Notes (Signed)
POST OPERATIVE OFFICE NOTE    CC:  F/u for surgery  HPI:  This is a 54 y.o. female who is s/p left brachio-basilic fistula on 09/20/6387 by Dr. Donnetta Hutching.   She had a TDC placed by IR on 07/17/2019.  The pt is on hemodialysis as she recently converted from PD as she had peritonitis. She dialyzes on M/W/F at [facility] in [place].  She states that she has some numbness in the left 4th and 5th fingers as well as some numbness down her forearm on the medial side.  She states her fingers feel like they are asleep.   This is unchanged from surgery.  She has her PD catheter checked on 6/8.  She is hoping to go back on PD sometime in the future.   No Known Allergies  Current Outpatient Medications  Medication Sig Dispense Refill  . acetaminophen (TYLENOL) 325 MG tablet Take 650 mg by mouth every 6 (six) hours as needed (pain).    Marland Kitchen aspirin EC 81 MG tablet Take 1 tablet (81 mg total) by mouth daily.    . Blood Glucose Monitoring Suppl (ACCU-CHEK GUIDE) w/Device KIT 1 Piece by Does not apply route as directed. 1 kit 0  . calcitRIOL (ROCALTROL) 0.5 MCG capsule Take 0.5-1 mcg by mouth See admin instructions. 0.26mg on Mondays through Fridays and take 135m on Saturdays and _0 1    0.99     0.73        +------------+----------+-------------+----------+--------+  AC Fossa    296    0.84     0.38        +------------+----------+-------------+----------+--------+  Left radial artery peak systolic velocity at the wrist increased from 31.5  cm/sec to 70.9 cm/sec with AVF compression.    Assessment/Plan:  This is a [age].o. female who is s/p:  left brachio-basilic fistula on 06/29/71/4287y Dr. EaDonnetta Hutching-the pt does not have evidence of steal.  She does have some numbness in the left 4th and 5th fingers as well as down the  medial side of the left forearm.  This most likely is due to nerve irritation and not steal sx given it is only 2 fingers. I discussed with Dr. Early and he agrees.   -will proceed with 2nd stage left BVT on a T/T with Dr. Early in the near future.       Samantha Rhyne, PAC [facility] [phone]  Clinic MD:  Early 

## 2019-08-29 ENCOUNTER — Encounter: Payer: Self-pay | Admitting: Internal Medicine

## 2019-08-29 ENCOUNTER — Ambulatory Visit (INDEPENDENT_AMBULATORY_CARE_PROVIDER_SITE_OTHER): Payer: Medicare Other | Admitting: Internal Medicine

## 2019-08-29 ENCOUNTER — Other Ambulatory Visit: Payer: Self-pay

## 2019-08-29 ENCOUNTER — Ambulatory Visit: Payer: Medicare Other | Attending: Internal Medicine

## 2019-08-29 VITALS — BP 166/84 | HR 75 | Temp 98.2°F | Resp 15 | Ht 68.0 in | Wt 244.0 lb

## 2019-08-29 DIAGNOSIS — N186 End stage renal disease: Secondary | ICD-10-CM

## 2019-08-29 DIAGNOSIS — R600 Localized edema: Secondary | ICD-10-CM

## 2019-08-29 DIAGNOSIS — E1122 Type 2 diabetes mellitus with diabetic chronic kidney disease: Secondary | ICD-10-CM | POA: Diagnosis not present

## 2019-08-29 DIAGNOSIS — J849 Interstitial pulmonary disease, unspecified: Secondary | ICD-10-CM | POA: Diagnosis not present

## 2019-08-29 DIAGNOSIS — I10 Essential (primary) hypertension: Secondary | ICD-10-CM | POA: Diagnosis not present

## 2019-08-29 DIAGNOSIS — D62 Acute posthemorrhagic anemia: Secondary | ICD-10-CM

## 2019-08-29 DIAGNOSIS — Z09 Encounter for follow-up examination after completed treatment for conditions other than malignant neoplasm: Secondary | ICD-10-CM | POA: Diagnosis not present

## 2019-08-29 DIAGNOSIS — Z23 Encounter for immunization: Secondary | ICD-10-CM

## 2019-08-29 MED ORDER — HYDRALAZINE HCL 25 MG PO TABS
25.0000 mg | ORAL_TABLET | Freq: Three times a day (TID) | ORAL | 3 refills | Status: DC
Start: 2019-08-29 — End: 2019-10-24

## 2019-08-29 MED ORDER — CETIRIZINE HCL 10 MG PO TABS
10.0000 mg | ORAL_TABLET | Freq: Every day | ORAL | 1 refills | Status: DC
Start: 2019-08-29 — End: 2020-07-16

## 2019-08-29 MED ORDER — CALCITRIOL 0.5 MCG PO CAPS: 0.5000 ug | ORAL_CAPSULE | ORAL | 1 refills | Status: DC

## 2019-08-29 NOTE — Progress Notes (Signed)
   Covid-19 Vaccination Clinic  Name:  Arletha Marschke Pinnix    MRN: [id number] DOB: 1966-01-25  08/29/2019  Ms. Elnoria Howard Pinnix was observed post Covid-19 immunization for 15 minutes without incident. She was provided with Vaccine Information Sheet and instruction to access the V-Safe system.   Ms. Elnoria Howard Pinnix was instructed to call 911 with any severe reactions post vaccine: Marland Kitchen Difficulty breathing  . Swelling of face and throat  . A fast heartbeat  . A bad rash all over body  . Dizziness and weakness   Immunizations Administered    Name Date Dose VIS Date Route   Moderna COVID-19 Vaccine 08/29/2019 12:02 PM 0.5 mL 02/2019 Intramuscular   Manufacturer: Moderna   Lot: [id number]   [place]: [id number]

## 2019-08-29 NOTE — Progress Notes (Signed)
New Patient Office Visit  Subjective:  Patient ID: Janet Mitchell, female    DOB: Feb 28, 1966  Age: 54 y.o. MRN: [id number]  CC:  Chief Complaint  Patient presents with  . New Patient (Initial Visit)    establish care    HPI Janet Mitchell is a very pleasant 54 year old female with past medical history of ESRD on peritoneal dialysis since 2017, transition to hemodialysis on 4/21 peritoneal dialysis catheter peritonitis, hypertension, insulin-dependent type 2 diabetes mellitus, GERD, bilateral leg swelling presents in our office for establishment of care and discuss about her recent hospitalization.  Patient tells me that she hospitalized from 07/26/2019 to 07/30/2019 due to acute blood loss anemia.  Work-up in ER suggested that she has intra-abdominal bleed around the peritoneal dialysis catheter.  She received total of 4 unit of blood transfusion and discharged home in stable condition.  Her CT abdomen shows bilateral ILD.  Outpatient pulmonology referral was recommended.  Today she tells me that she is not feeling well overall due to her chronic medical issues and recurrent hospitalization.  She tells me that she could not sleep at night even after taking melatonin and Benadryl.  She tells me that her abdomen is distended and she feels like 20-week pregnant and not feeling good about herself as she has been losing her hair.  She has a scheduled appointment with peritoneal specialist on 09/03/2019.  She was seen by vascular surgery yesterday for fistula placement.  Her blood pressure is elevated today 166/84.  She takes losartan and labetalol.  Reports being compliant with medication.  Tells me that her blood pressure runs in 150s-160s at home.  Refused to take amlodipine.  He has bilateral leg swelling however denies orthopnea, PND.  Her last echo 07/09/2019 which showed ejection fraction of 50 to 55% with grade 2 diastolic dysfunction.  She is anuric, her torsemide was discontinued by  nephrology.   She denies smoking, alcohol, street drug use.  She lives with her husband at home who is the caregiver.  She denies active suicidal or homicidal thoughts.  Past Medical History:  Diagnosis Date  . Anemia   . Arthritis   . CHF (congestive heart failure) ([place])    pt states she has been cleared of heart failure/disease  . Chronic cholecystitis with calculus   . COVID-19 virus infection 03/2019  . ESRD (end stage renal disease) on dialysis [facility])    "peritoneal dialysis q hs" (03/09/2018)  . Headache    "a few/wk" (03/09/2018) - no longer having these  . History of blood transfusion 10/2017   "low blood count" (03/09/2018)  . Hypertension   . Spinal headache   . Type II diabetes mellitus ([place])     Past Surgical History:  Procedure Laterality Date  . AMPUTATION TOE Left 2013   Great toe  . AV FISTULA PLACEMENT Left 07/22/2019   Procedure: LEFT ARM BASILIC ARTERIOVENOUS (AV) FISTULA CREATION;  Surgeon: Rosetta Posner, MD;  Location: [facility];  Service: Vascular;  Laterality: Left;  . BIOPSY  10/24/2018   Procedure: BIOPSY;  Surgeon: Daneil Dolin, MD;  [facility];  Service: Endoscopy;;  right and left colon  . CATARACT EXTRACTION W/ INTRAOCULAR LENS IMPLANT Right   . CESAREAN SECTION  1994; 1998  . CHOLECYSTECTOMY N/A 03/09/2018   Procedure: LAPAROSCOPIC CHOLECYSTECTOMY WITH INTRAOPERATIVE CHOLANGIOGRAM ERAS PATHWAY;  Surgeon: Donnie Mesa, MD;  Location: [facility];  Service: General;  Laterality: N/A;  . COLONOSCOPY N/A 10/24/2018  Procedure: COLONOSCOPY;  Surgeon: Daneil Dolin, MD;  [facility];  Service: Endoscopy;  Laterality: N/A;  1:00pm  . EYE SURGERY  05/15/2018   Removal of blood in the globe (due to DM)  . FLEXIBLE SIGMOIDOSCOPY N/A 11/24/2017   Procedure: FLEXIBLE SIGMOIDOSCOPY;  Surgeon: Daneil Dolin, MD;  [facility];  Service: Endoscopy;  Laterality: N/A;  . IR FLUORO GUIDE CV LINE RIGHT  07/17/2019  . IR PARACENTESIS   07/09/2019  . IR US GUIDE VASC ACCESS RIGHT  07/17/2019  . LAPAROSCOPIC CHOLECYSTECTOMY  03/09/2018  . PERITONEAL CATHETER INSERTION  2017  . POLYPECTOMY  10/24/2018   Procedure: POLYPECTOMY;  Surgeon: Daneil Dolin, MD;  [facility];  Service: Endoscopy;;  . TOTAL HIP ARTHROPLASTY Left 1997    Family History  Problem Relation Age of Onset  . Heart disease Mother   . Thrombocytopenia Mother        TTP  . Heart failure Father   . Kidney disease Paternal Grandfather   . Colon cancer Neg Hx     Social History   Socioeconomic History  . Marital status: Married    Spouse name: Not on file  . Number of children: 2  . Years of education: Not on file  . Highest education level: Not on file  Occupational History  . Not on file  Tobacco Use  . Smoking status: Passive Smoke Exposure - Never Smoker  . Smokeless tobacco: Never Used  Substance and Sexual Activity  . Alcohol use: Never  . Drug use: Never  . Sexual activity: Yes    Birth control/protection: None  Other Topics Concern  . Not on file  Social History Narrative  . Not on file   Social Determinants of Health   Financial Resource Strain:   . Difficulty of Paying Living Expenses:   Food Insecurity:   . Worried About Charity fundraiser in the Last Year:   . Arboriculturist in the Last Year:   Transportation Needs:   . Film/video editor (Medical):   Marland Kitchen Lack of Transportation (Non-Medical):   Physical Activity:   . Days of Exercise per Week:   . Minutes of Exercise per Session:   Stress:   . Feeling of Stress :   Social Connections:   . Frequency of Communication with Friends and Family:   . Frequency of Social Gatherings with Friends and Family:   . Attends Religious Services:   . Active Member of Clubs or Organizations:   . Attends Archivist Meetings:   Marland Kitchen Marital Status:   Intimate Partner Violence:   . Fear of Current or Ex-Partner:   . Emotionally Abused:   Marland Kitchen Physically Abused:    . Sexually Abused:     ROS Review of Systems  Constitutional: Positive for unexpected weight change.  HENT: Positive for congestion, postnasal drip and sneezing.   Eyes: Negative.   Respiratory: Positive for cough.   Cardiovascular: Positive for leg swelling.  Gastrointestinal: Positive for abdominal distention and abdominal pain.  Endocrine: Negative.   Genitourinary: Negative.   Musculoskeletal: Negative.   Allergic/Immunologic: Negative.   Neurological: Negative.   Hematological: Negative.   Psychiatric/Behavioral: Positive for sleep disturbance.    Objective:   Today's Vitals: BP (!) 166/84   Pulse 75   Temp 98.2 F (36.8 C) (Temporal)   Resp 15   Ht '5\' 8"'  (1.727 m)   Wt 244 lb 0.6 oz (110.7 kg)  SpO2 96%   BMI 37.11 kg/m   Physical Exam Constitutional:      Appearance: Normal appearance. She is obese.  HENT:     Head: Normocephalic and atraumatic.     Nose: Nose normal.     Mouth/Throat:     Mouth: Mucous membranes are moist.  Eyes:     Extraocular Movements: Extraocular movements intact.     Pupils: Pupils are equal, round, and reactive to light.  Cardiovascular:     Rate and Rhythm: Normal rate and regular rhythm.     Pulses: Normal pulses.     Heart sounds: Normal heart sounds.     Comments: Bilateral 3+ pitting edema positive Pulmonary:     Breath sounds: Rales present.  Abdominal:     General: Bowel sounds are normal. There is distension.     Palpations: Abdomen is soft.  Musculoskeletal:        General: Normal range of motion.     Cervical back: Normal range of motion and neck supple.  Neurological:     General: No focal deficit present.     Mental Status: She is alert and oriented to person, place, and time.  Psychiatric:        Mood and Affect: Mood normal.        Behavior: Behavior normal.     Assessment & Plan:   Problem List Items Addressed This Visit      Cardiovascular and Mediastinum   Essential hypertension   Relevant  Medications   hydrALAZINE (APRESOLINE) 25 MG tablet     Endocrine   Type 2 diabetes mellitus with ESRD (end-stage renal disease) ([facility])     Genitourinary   ESRD (end stage renal disease) ([place])     Other   Edema of lower extremity   Acute blood loss anemia    Other Visit Diagnoses    Hospital discharge follow-up    -  Primary   ILD (interstitial lung disease) ([place])       Relevant Orders   Ambulatory referral to [facility] discharge follow-up: -Reviewed H&P and discharge summary. -Check CBC, magnesium, phosphorus, lipid panel, 2 view chest x-ray. -Follow-up in 1 month  Hypertension: Uncontrolled -Continue losartan and labetalol -Add hydralazine 25 mg 3 times daily -Advised DASH diet, exercise and weight loss  Diabetes mellitus: Insulin-dependent. -Last A1c 7.2% -Continue Toujeo 10 units daily -Advise carb consistent diet, exercise.  ESRD on hemodialysis: (MWF) -She is compliant with her hemodialysis appointments -Was evaluated by vascular surgery for graft placement -Refills given on calcitriol  Bilateral leg swelling: -Reviewed echo from April 2021 which showed ejection fraction of 50 to 00%, grade 2 diastolic dysfunction. -She does not produce urine-therefore not on diuretics -Continue aspirin, labetalol.  Check magnesium and CMP -Advised leg elevation, compression stocking and low-sodium diet  ILD: -Reviewed CT abdomen -Refer patient to pulmonology for further evaluation  Seasonal allergies: Prescribe Zyrtec take -Tylenol as needed for headache  History of acute blood loss anemia: -Check CBC today  Pseudocyst in the setting of peritoneal dialysis catheter: -Reviewed CT abdomen which shows cyst-29 x 14 x 21 cm in size -Has a scheduled appointment with peritoneal dialysis specialist on 09/03/19  Morbid obesity with BMI of 37 -Discussed about dietary modification,walk/exercise.  Outpatient Encounter Medications as of 08/29/2019  Medication Sig    . acetaminophen (TYLENOL) 325 MG tablet Take 650 mg by mouth every 6 (six) hours as needed (pain).  Marland Kitchen aspirin EC 81 MG tablet Take  1 tablet (81 mg total) by mouth daily.  . Blood Glucose Monitoring Suppl (ACCU-CHEK GUIDE) w/Device KIT 1 Piece by Does not apply route as directed.  . calcitRIOL (ROCALTROL) 0.5 MCG capsule Take 1-2 capsules (0.5-1 mcg total) by mouth See admin instructions. 0.56mg on Mondays through Fridays and take 132m on Saturdays and Sundays  . glucose blood (ACCU-CHEK GUIDE) test strip Use as instructed  . insulin glargine, 2 Unit Dial, (TOUJEO MAX SOLOSTAR) 300 UNIT/ML Solostar Pen Inject 10 Units into the skin daily.  . Marland Kitchenabetalol (NORMODYNE) 300 MG tablet Take 300 mg by mouth 2 (two) times daily.   . Marland Kitchenosartan (COZAAR) 100 MG tablet Take 100 mg by mouth daily.   . multivitamin (RENA-VIT) TABS tablet Take 1 tablet by mouth at bedtime.  . [DISCONTINUED] calcitRIOL (ROCALTROL) 0.5 MCG capsule Take 0.5-1 mcg by mouth See admin instructions. 0.76m67mon Mondays through Fridays and take 1mc68mn Saturdays and Sundays  . cetirizine (ZYRTEC) 10 MG tablet Take 1 tablet (10 mg total) by mouth daily.  . hydrALAZINE (APRESOLINE) 25 MG tablet Take 1 tablet (25 mg total) by mouth 3 (three) times daily.  . [DISCONTINUED] HYDROcodone-acetaminophen (NORCO) 5-325 MG tablet Take 1 tablet by mouth every 6 (six) hours as needed for moderate pain. (Patient not taking: Reported on 08/29/2019)   No facility-administered encounter medications on file as of 08/29/2019.    Follow-up: Return in about 4 weeks (around 09/26/2019).   RinkMckinley Jewel

## 2019-08-29 NOTE — Patient Instructions (Addendum)
Check CBC, CMP, Magnesium, phosphorus, lipid panel &  2 view CXR Add Hydralazine 25 mg TID Refills given on Calcitriol Add Zyrtec for allergies Compression stockings, leg elevation, Low sodium diet Limit water intake to 40 oz/day Follow up in 1 month

## 2019-08-30 ENCOUNTER — Other Ambulatory Visit: Payer: Self-pay

## 2019-08-30 ENCOUNTER — Telehealth: Payer: Self-pay

## 2019-08-30 DIAGNOSIS — D649 Anemia, unspecified: Secondary | ICD-10-CM

## 2019-08-30 LAB — COMPLETE METABOLIC PANEL WITH GFR
AG Ratio: 0.7 (calc) — ABNORMAL LOW (ref 1.0–2.5)
ALT: 15 U/L (ref 6–29)
AST: 16 U/L (ref 10–35)
Albumin: 3.4 g/dL — ABNORMAL LOW (ref 3.6–5.1)
Alkaline phosphatase (APISO): 128 U/L (ref 37–153)
BUN/Creatinine Ratio: 7 (calc) (ref 6–22)
BUN: 35 mg/dL — ABNORMAL HIGH (ref 7–25)
CO2: 30 mmol/L (ref 20–32)
Calcium: 9.2 mg/dL (ref 8.6–10.4)
Chloride: 96 mmol/L — ABNORMAL LOW (ref 98–110)
Creat: 5.02 mg/dL — ABNORMAL HIGH (ref 0.50–1.05)
GFR, Est African American: 11 mL/min/{1.73_m2} — ABNORMAL LOW (ref >=60)
GFR, Est Non African American: 9 mL/min/{1.73_m2} — ABNORMAL LOW (ref >=60)
Globulin: 4.7 g/dL (calc) — ABNORMAL HIGH (ref 1.9–3.7)
Glucose, Bld: 115 mg/dL (ref 65–139)
Potassium: 4 mmol/L (ref 3.5–5.3)
Sodium: 136 mmol/L (ref 135–146)
Total Bilirubin: 0.5 mg/dL (ref 0.2–1.2)
Total Protein: 8.1 g/dL (ref 6.1–8.1)

## 2019-08-30 LAB — CBC
HCT: 25.3 % — ABNORMAL LOW (ref 35.0–45.0)
Hemoglobin: 7.5 g/dL — ABNORMAL LOW (ref 11.7–15.5)
MCH: 23.3 pg — ABNORMAL LOW (ref 27.0–33.0)
MCHC: 29.6 g/dL — ABNORMAL LOW (ref 32.0–36.0)
MCV: 78.6 fL — ABNORMAL LOW (ref 80.0–100.0)
Platelets: 244 10*3/uL (ref 140–400)
RBC: 3.22 10*6/uL — ABNORMAL LOW (ref 3.80–5.10)
RDW: 20.8 % — ABNORMAL HIGH (ref 11.0–15.0)
WBC: 7.4 10*3/uL (ref 3.8–10.8)

## 2019-08-30 LAB — LIPID PANEL
Cholesterol: 114 mg/dL (ref <200)
HDL: 35 mg/dL — ABNORMAL LOW (ref >=50)
LDL Cholesterol (Calc): 62 mg/dL (calc)
Non-HDL Cholesterol (Calc): 79 mg/dL (calc) (ref <130)
Total CHOL/HDL Ratio: 3.3 (calc) (ref <5.0)
Triglycerides: 91 mg/dL (ref <150)

## 2019-08-30 LAB — MAGNESIUM: Magnesium: 1.7 mg/dL (ref 1.5–2.5)

## 2019-08-30 LAB — PHOSPHORUS: Phosphorus: 4.1 mg/dL (ref 2.5–4.5)

## 2019-08-30 NOTE — Telephone Encounter (Signed)
Per Dr.Pahwani's request, this patient had a hemoglobin drop from 8.3 to 7.5. She will have a redraw on Monday 6/7 to see if she has dropped anymore. If she has dropped she will need to go to the closest ED for a transfusion. Dr.Pahwani would like for you to review the lab when it comes in.   Thank you.

## 2019-08-30 NOTE — Telephone Encounter (Signed)
Spoke with patient and advised her of providers recommendations to seek treatment at ED due to "just feeling yucky" and having a hb drop. Patients verbalizes understanding. She also stated she received her 2nd Covid vaccine yesterday and she woke up with a headache and nausea and is thinking that is where it is coming from. Will forward message to provider.

## 2019-09-02 ENCOUNTER — Other Ambulatory Visit: Payer: Self-pay

## 2019-09-02 ENCOUNTER — Telehealth: Payer: Self-pay | Admitting: Family Medicine

## 2019-09-02 DIAGNOSIS — D649 Anemia, unspecified: Secondary | ICD-10-CM

## 2019-09-02 NOTE — Telephone Encounter (Signed)
Pls call pt right after lunch and ask about her getting blood draw, I do not see that she has done it yet, and was it a stat order/ if not I recommend at as the plan is to send her to the ED if needed  Also pls let me know her dialysis schedule, so that if she had labs there today, no need to repeat , will just have to follow up and may neeed to contact the center / doc  Please discuss questions / concerns with me

## 2019-09-02 NOTE — Telephone Encounter (Signed)
Spoke with patient. She will have lab drawn after dialysis. Spoke with dialysis and they can't draw it now since dialysis has started. Changed lab to a stat order.

## 2019-09-02 NOTE — Telephone Encounter (Signed)
I called the pt, she states that the hospital lab told her that she cannot have lab drawn after 4 pm. She will go to the hospital tomorrow at 8 for the draw and I have advised if a problem to come over to the office we will then have lab from [facility]. Please continuew to follow with me till we are done

## 2019-09-03 DIAGNOSIS — Z4902 Encounter for fitting and adjustment of peritoneal dialysis catheter: Secondary | ICD-10-CM | POA: Diagnosis not present

## 2019-09-03 DIAGNOSIS — I120 Hypertensive chronic kidney disease with stage 5 chronic kidney disease or end stage renal disease: Secondary | ICD-10-CM | POA: Diagnosis not present

## 2019-09-03 DIAGNOSIS — E1122 Type 2 diabetes mellitus with diabetic chronic kidney disease: Secondary | ICD-10-CM | POA: Diagnosis not present

## 2019-09-03 DIAGNOSIS — R1084 Generalized abdominal pain: Secondary | ICD-10-CM | POA: Diagnosis not present

## 2019-09-03 DIAGNOSIS — N185 Chronic kidney disease, stage 5: Secondary | ICD-10-CM | POA: Diagnosis not present

## 2019-09-03 NOTE — Telephone Encounter (Signed)
Noted, no lab drawn yesterday, pt stated she was unable to get after 4 in the hospital

## 2019-09-03 NOTE — Telephone Encounter (Signed)
Reached out to patient to see if she had CBC drawn. No answer, left vm

## 2019-09-03 NOTE — Telephone Encounter (Signed)
Message left on both numbers for pt re her need to get labs drawn, I will not call backotday, this is done at 1:05 pm

## 2019-09-03 NOTE — Telephone Encounter (Signed)
At 5 pm today, after 3 to 4 messages being left and direct communication with the pt by me yesterday, no blood work done to assess Hb, please mail pt a letter , stating VITAL to have lab drawn , concerned about low blood count, please send on 09/04/2019

## 2019-09-03 NOTE — Telephone Encounter (Signed)
Left message for patient trying to touch base to see if she has had the CBC drawn. Let her know to call the office back and speak with any nurse to let them know.

## 2019-09-05 NOTE — Telephone Encounter (Signed)
Called [facility] on Wednesday and asked them to draw the CBC since patient has not gone to have the labwork. Nurse stated that if the PA ok'ed the order, they would draw it and would send [place] the results Friday

## 2019-09-09 DIAGNOSIS — Z992 Dependence on renal dialysis: Secondary | ICD-10-CM | POA: Diagnosis not present

## 2019-09-12 ENCOUNTER — Other Ambulatory Visit: Payer: Self-pay

## 2019-09-13 ENCOUNTER — Telehealth: Payer: Self-pay

## 2019-09-13 NOTE — Telephone Encounter (Signed)
VM to call back regarding a DX code for the PT --and prior Auth

## 2019-09-13 NOTE — Telephone Encounter (Signed)
Spoke with PA center. Expect to have answer regarding Calcitrol in the next 24 hrs per representative.

## 2019-09-20 ENCOUNTER — Emergency Department (HOSPITAL_COMMUNITY)
Admission: EM | Admit: 2019-09-20 | Discharge: 2019-09-20 | Disposition: A | Discharge status: Home / Self Care | Payer: Medicare Other | Attending: Emergency Medicine | Admitting: Emergency Medicine

## 2019-09-20 ENCOUNTER — Other Ambulatory Visit: Payer: Self-pay

## 2019-09-20 ENCOUNTER — Encounter (HOSPITAL_COMMUNITY): Payer: Self-pay | Admitting: *Deleted

## 2019-09-20 ENCOUNTER — Emergency Department (HOSPITAL_COMMUNITY): Payer: Medicare Other

## 2019-09-20 DIAGNOSIS — Y999 Unspecified external cause status: Secondary | ICD-10-CM | POA: Insufficient documentation

## 2019-09-20 DIAGNOSIS — N186 End stage renal disease: Secondary | ICD-10-CM | POA: Insufficient documentation

## 2019-09-20 DIAGNOSIS — S82492A Other fracture of shaft of left fibula, initial encounter for closed fracture: Secondary | ICD-10-CM | POA: Insufficient documentation

## 2019-09-20 DIAGNOSIS — Z794 Long term (current) use of insulin: Secondary | ICD-10-CM | POA: Insufficient documentation

## 2019-09-20 DIAGNOSIS — I509 Heart failure, unspecified: Secondary | ICD-10-CM | POA: Diagnosis not present

## 2019-09-20 DIAGNOSIS — M25561 Pain in right knee: Secondary | ICD-10-CM | POA: Diagnosis not present

## 2019-09-20 DIAGNOSIS — Z7722 Contact with and (suspected) exposure to environmental tobacco smoke (acute) (chronic): Secondary | ICD-10-CM | POA: Diagnosis not present

## 2019-09-20 DIAGNOSIS — Z96642 Presence of left artificial hip joint: Secondary | ICD-10-CM | POA: Insufficient documentation

## 2019-09-20 DIAGNOSIS — W010XXA Fall on same level from slipping, tripping and stumbling without subsequent striking against object, initial encounter: Secondary | ICD-10-CM | POA: Insufficient documentation

## 2019-09-20 DIAGNOSIS — Z79899 Other long term (current) drug therapy: Secondary | ICD-10-CM | POA: Diagnosis not present

## 2019-09-20 DIAGNOSIS — Z7982 Long term (current) use of aspirin: Secondary | ICD-10-CM | POA: Diagnosis not present

## 2019-09-20 DIAGNOSIS — I132 Hypertensive heart and chronic kidney disease with heart failure and with stage 5 chronic kidney disease, or end stage renal disease: Secondary | ICD-10-CM | POA: Diagnosis not present

## 2019-09-20 DIAGNOSIS — S82832A Other fracture of upper and lower end of left fibula, initial encounter for closed fracture: Secondary | ICD-10-CM

## 2019-09-20 DIAGNOSIS — M7989 Other specified soft tissue disorders: Secondary | ICD-10-CM | POA: Diagnosis not present

## 2019-09-20 DIAGNOSIS — E119 Type 2 diabetes mellitus without complications: Secondary | ICD-10-CM | POA: Diagnosis not present

## 2019-09-20 DIAGNOSIS — Y92000 Kitchen of unspecified non-institutional (private) residence as  the place of occurrence of the external cause: Secondary | ICD-10-CM | POA: Insufficient documentation

## 2019-09-20 DIAGNOSIS — M25562 Pain in left knee: Secondary | ICD-10-CM | POA: Diagnosis not present

## 2019-09-20 DIAGNOSIS — Z992 Dependence on renal dialysis: Secondary | ICD-10-CM | POA: Insufficient documentation

## 2019-09-20 DIAGNOSIS — M25572 Pain in left ankle and joints of left foot: Secondary | ICD-10-CM | POA: Diagnosis not present

## 2019-09-20 DIAGNOSIS — S82142A Displaced bicondylar fracture of left tibia, initial encounter for closed fracture: Secondary | ICD-10-CM | POA: Diagnosis not present

## 2019-09-20 DIAGNOSIS — R600 Localized edema: Secondary | ICD-10-CM | POA: Diagnosis not present

## 2019-09-20 DIAGNOSIS — Y9389 Activity, other specified: Secondary | ICD-10-CM | POA: Diagnosis not present

## 2019-09-20 DIAGNOSIS — S8992XA Unspecified injury of left lower leg, initial encounter: Secondary | ICD-10-CM | POA: Diagnosis present

## 2019-09-20 DIAGNOSIS — I7090 Unspecified atherosclerosis: Secondary | ICD-10-CM | POA: Diagnosis not present

## 2019-09-20 DIAGNOSIS — S8252XA Displaced fracture of medial malleolus of left tibia, initial encounter for closed fracture: Secondary | ICD-10-CM | POA: Diagnosis not present

## 2019-09-20 IMAGING — DX DG KNEE COMPLETE 4+V*L*
4 series · 4 of 4 positions shown · non-contrast
Comparison: None

CLINICAL DATA: RIGHT knee pain, fell on [REDACTED]

EXAM:
LEFT KNEE - COMPLETE 4+ VIEW

[knee ap]
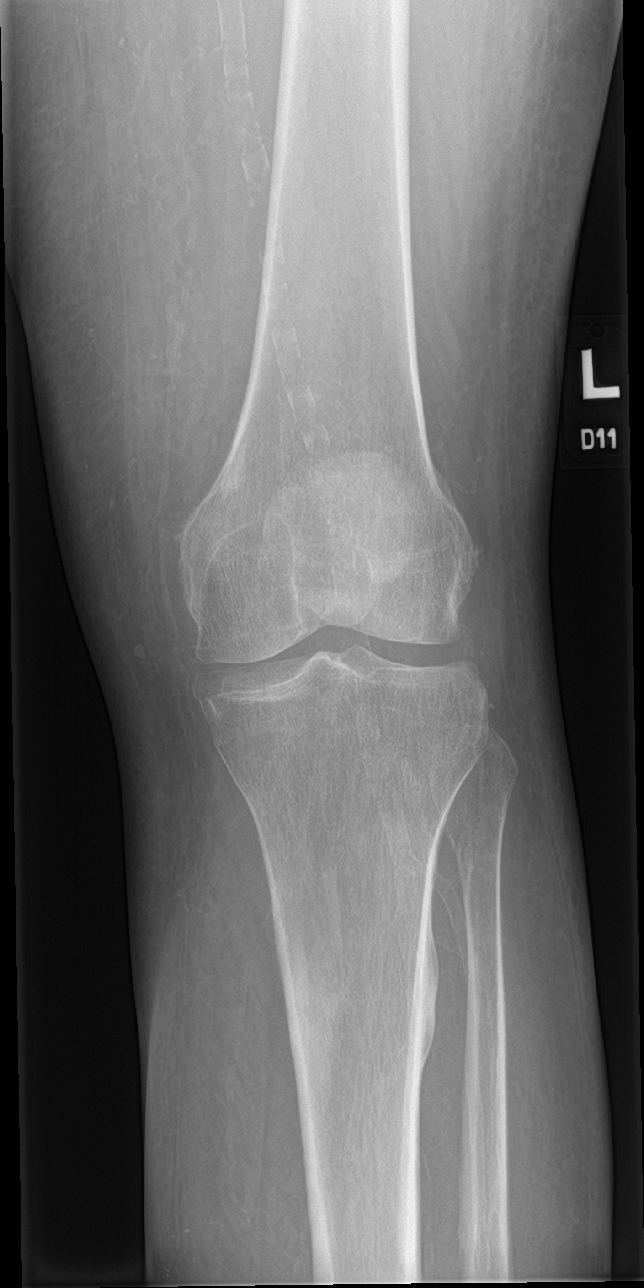

[knee obl (1 of 2)]
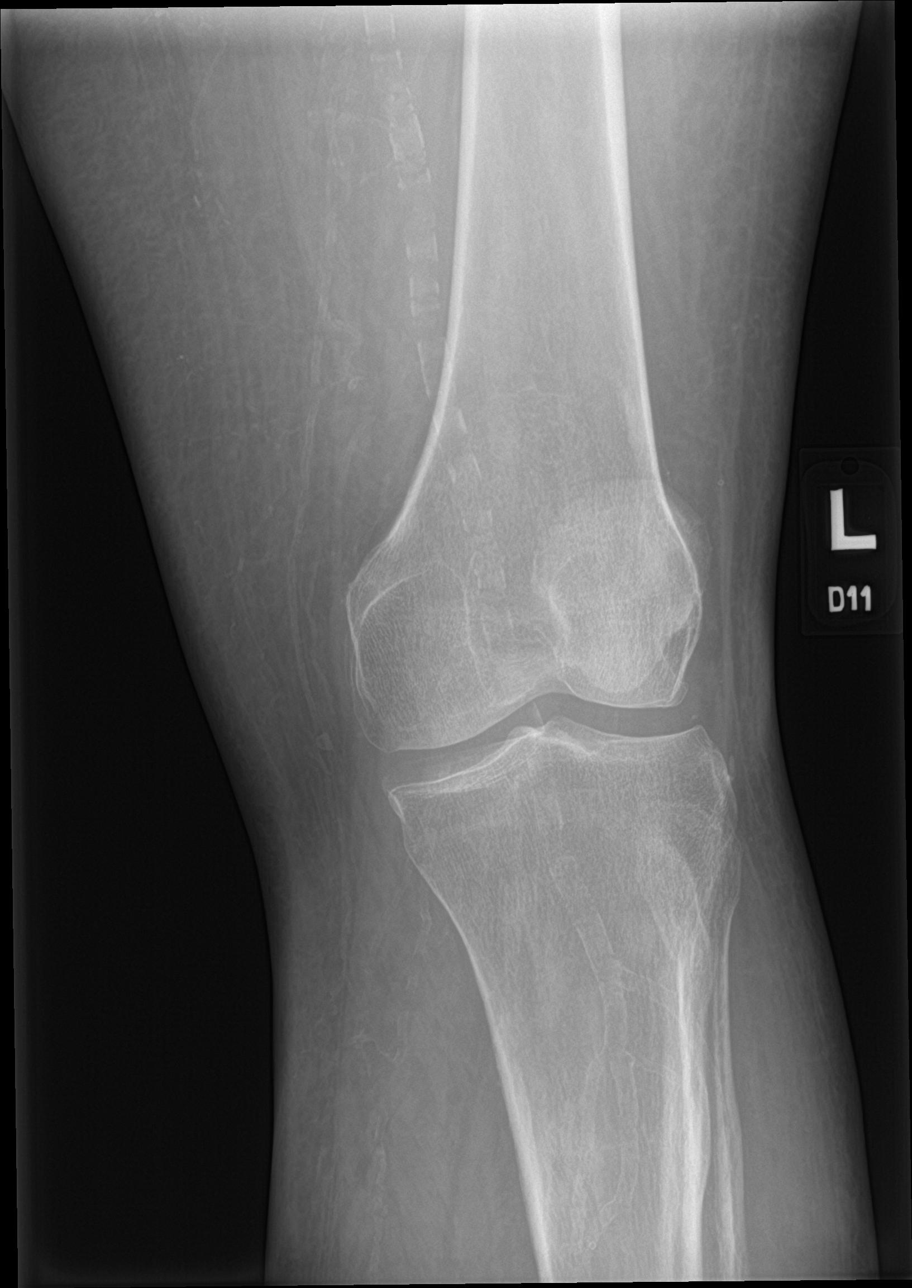

[knee obl (2 of 2)]
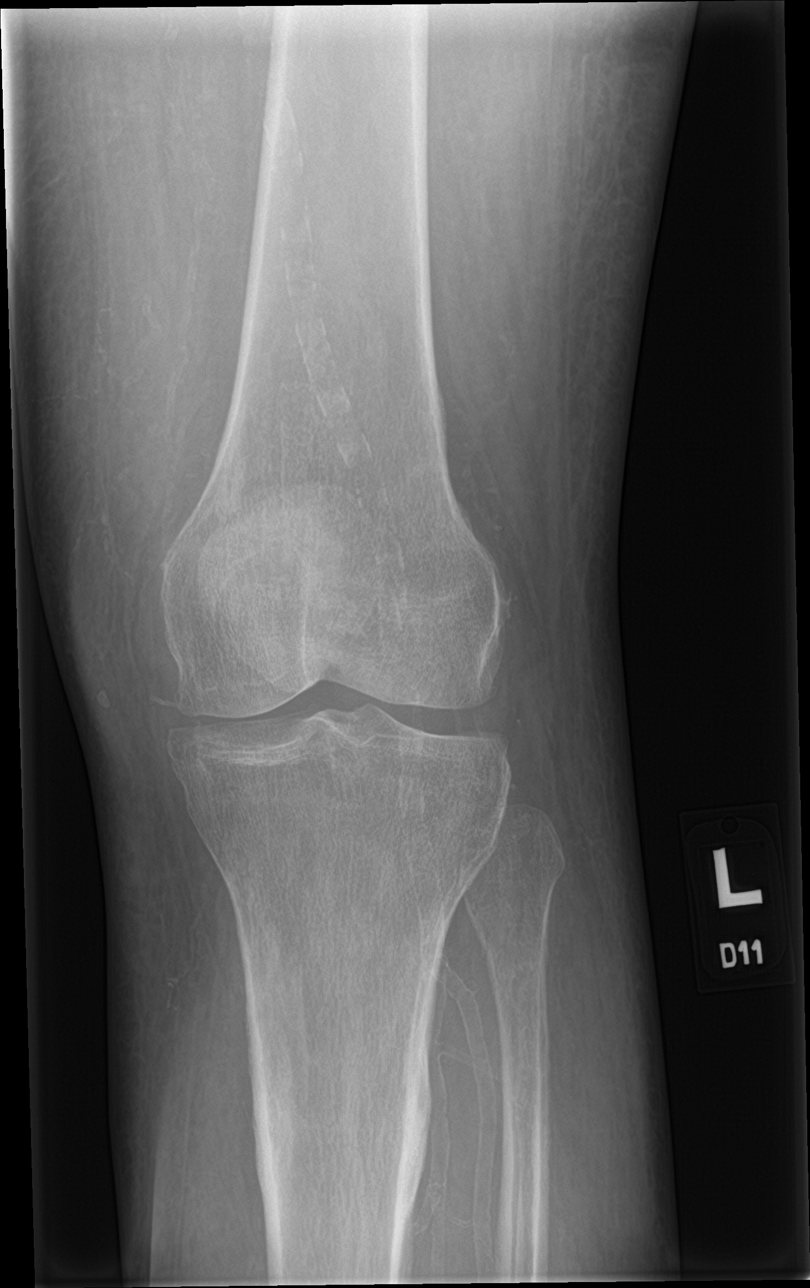

[knee lat]
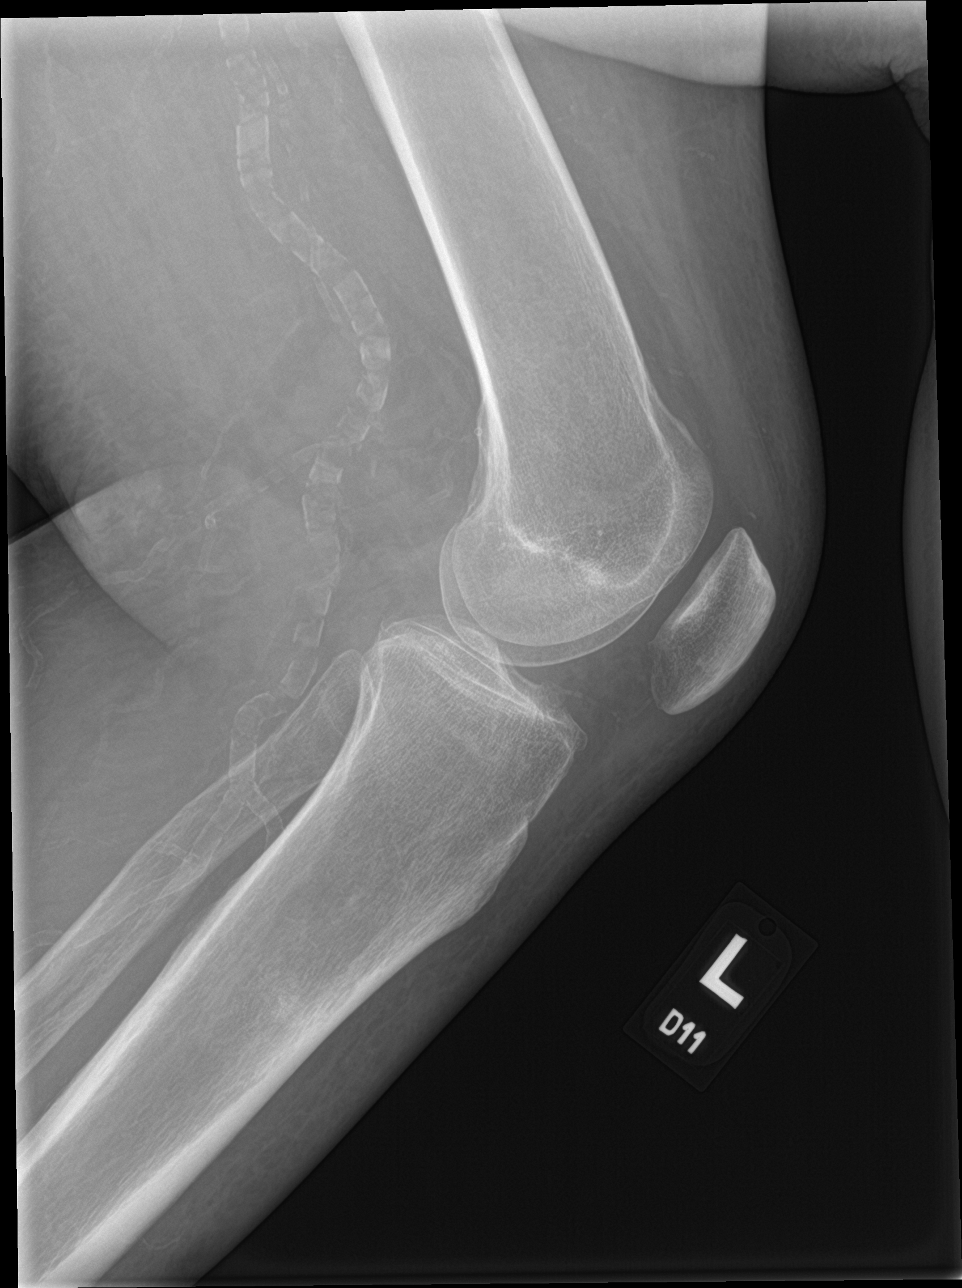

[4 of 4 positions shown; findings below may reference images not displayed]

FINDINGS: Osseous demineralization.

Joint spaces preserved.

Questionable subtle compression fracture of the medial tibial
plateau.

Slight concavity of the medial tibial plateau articular surface.

No additional fracture, dislocation or bone destruction.

Scattered atherosclerotic calcifications.

No joint effusion.
IMPRESSION: Question subtle fracture at the medial tibial plateau; further
evaluation by CT recommended.

## 2019-09-20 IMAGING — DX DG FOOT COMPLETE 3+V*L*
3 series · 3 of 3 positions shown · non-contrast
Comparison: [DATE]

Correlation: LEFT ankle radiographs [DATE]

CLINICAL DATA: Fell on [REDACTED], pain and swelling in LEFT foot
and ankle, LEFT knee pain

EXAM:
LEFT FOOT - COMPLETE 3+ VIEW

[foot ap]
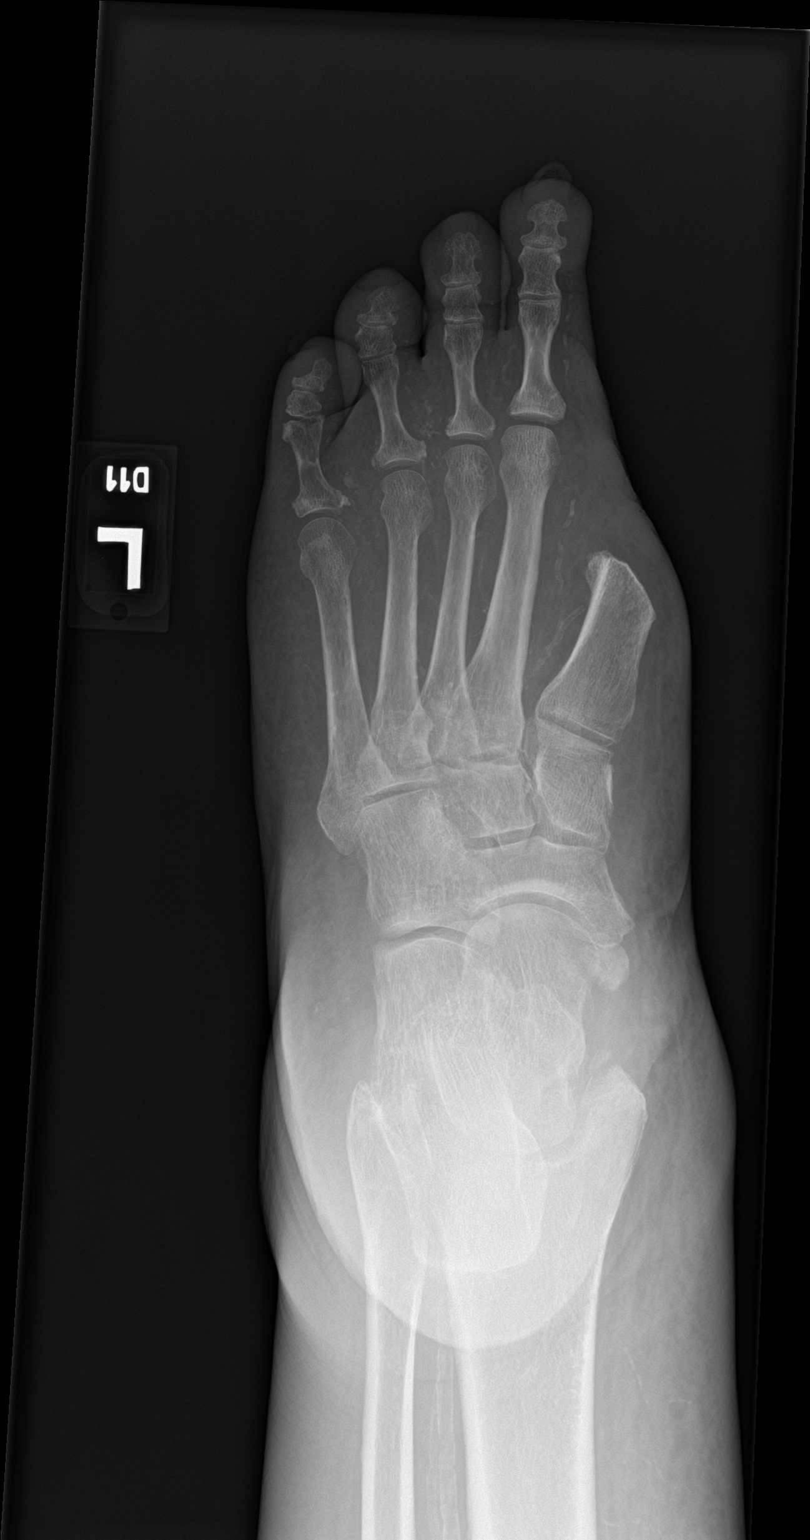

[foot obl]
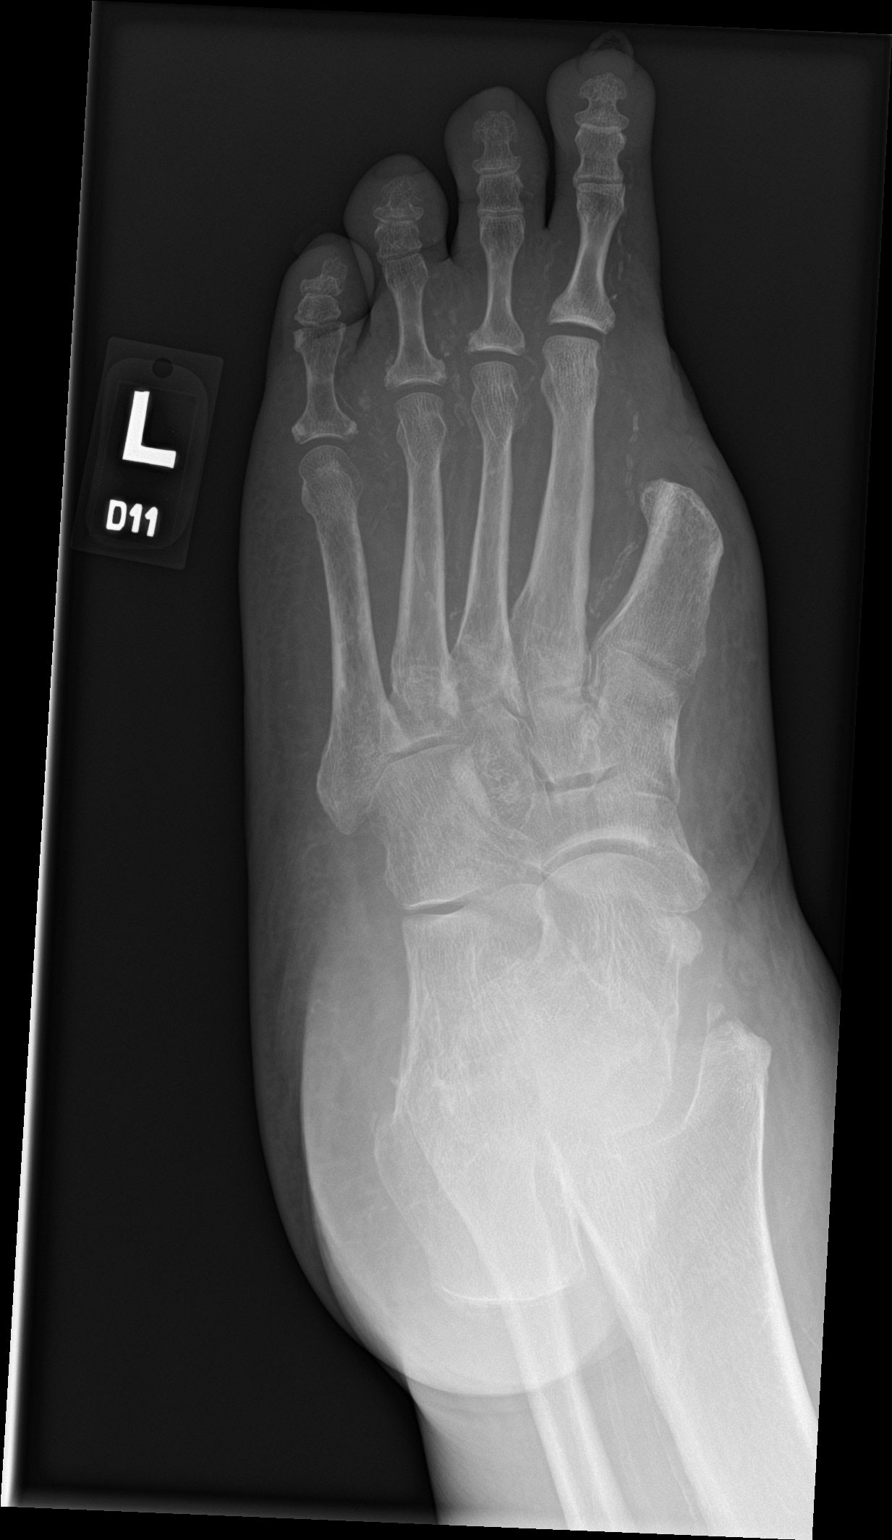

[foot lat]
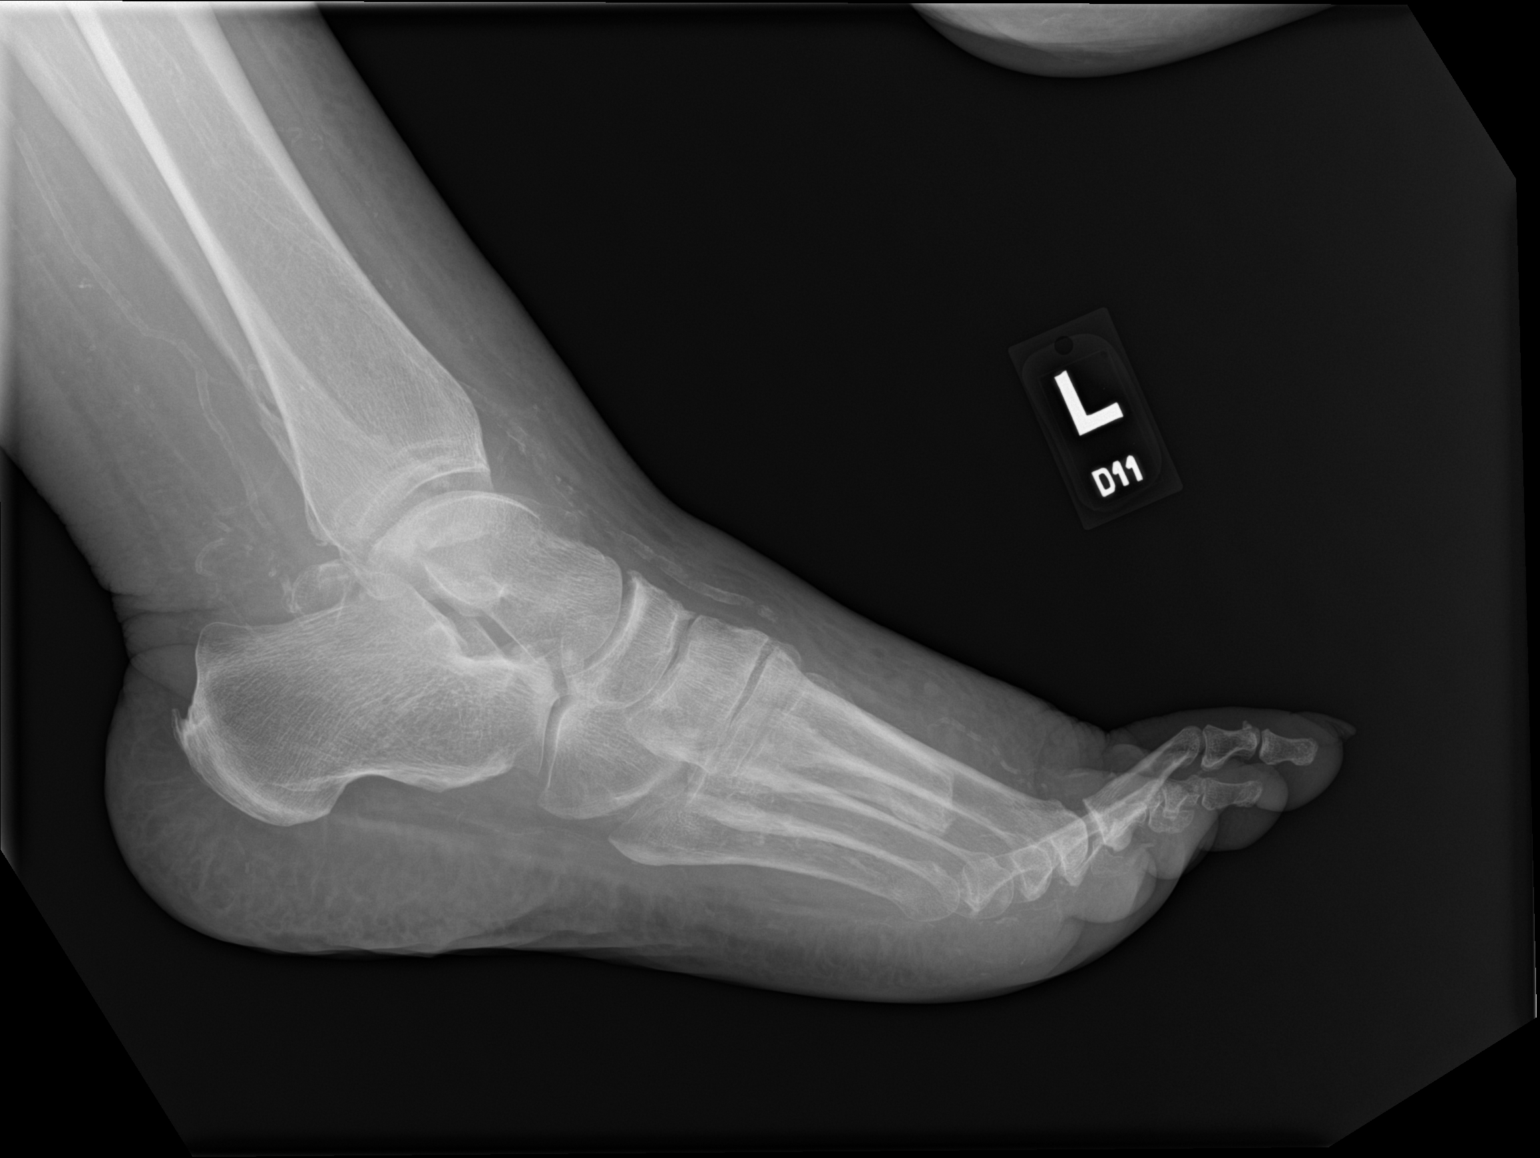

[3 of 3 positions shown; findings below may reference images not displayed]

FINDINGS: Prior amputation of the first ray through the mid first metatarsal.

Bones demineralized.

Joint spaces preserved.

Scattered soft tissue swelling and small vessel vascular
calcification.

Avulsion fracture at tip of medial malleolus.

Previously identified distal fibular fracture not well demonstrated
on this exam.

On lateral view, a small curvilinear density is seen adjacent to the
posterior margin of the distal tibia, appeared more corticated on
ankle radiographs but less so on this exam, cannot completely
exclude a tiny avulsion fracture at the posterior malleolus.

No additional fracture, dislocation, or bone destruction.
IMPRESSION: Significant soft tissue swelling LEFT foot/ankle.

Avulsion fracture at tip of medial malleolus.

Previously seen distal fibular fracture not well demonstrated on
this exam.

Questionable tiny avulsion fracture of posterior malleolus.

## 2019-09-20 IMAGING — DX DG ANKLE COMPLETE 3+V*L*
3 series · 3 of 3 positions shown · non-contrast
Comparison: None

CLINICAL DATA: Fell on [REDACTED], pain and swelling LEFT ankle and
LEFT foot, LEFT knee pain

EXAM:
LEFT ANKLE COMPLETE - 3+ VIEW

[ankle ap]
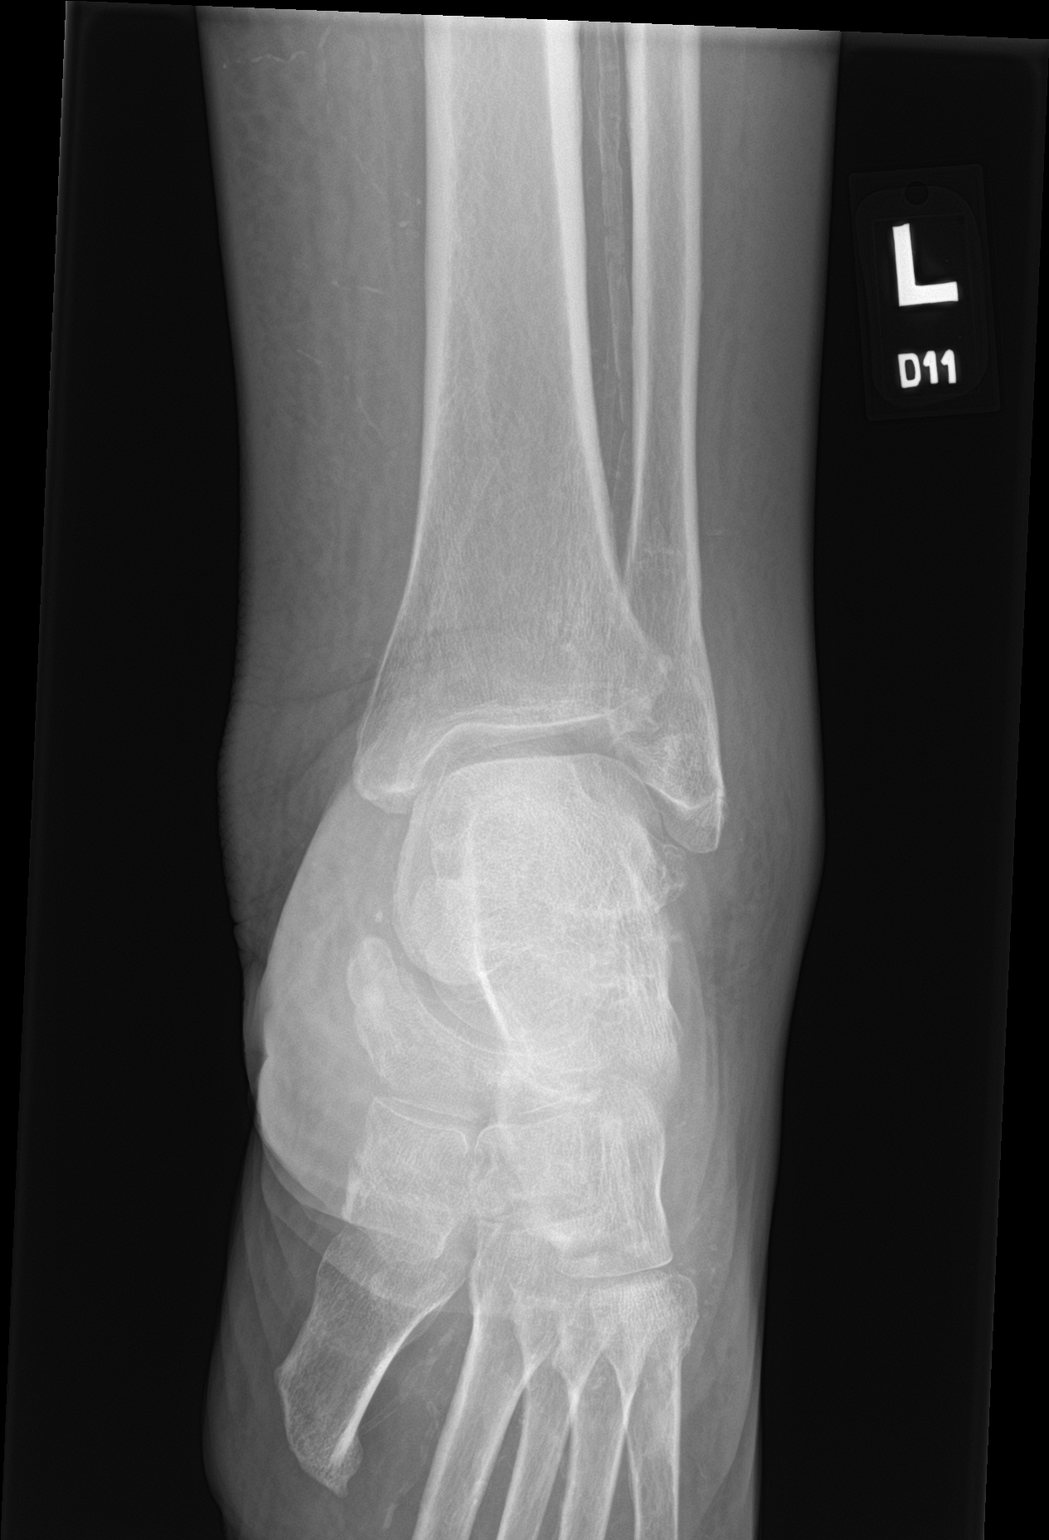

[ankle obl]
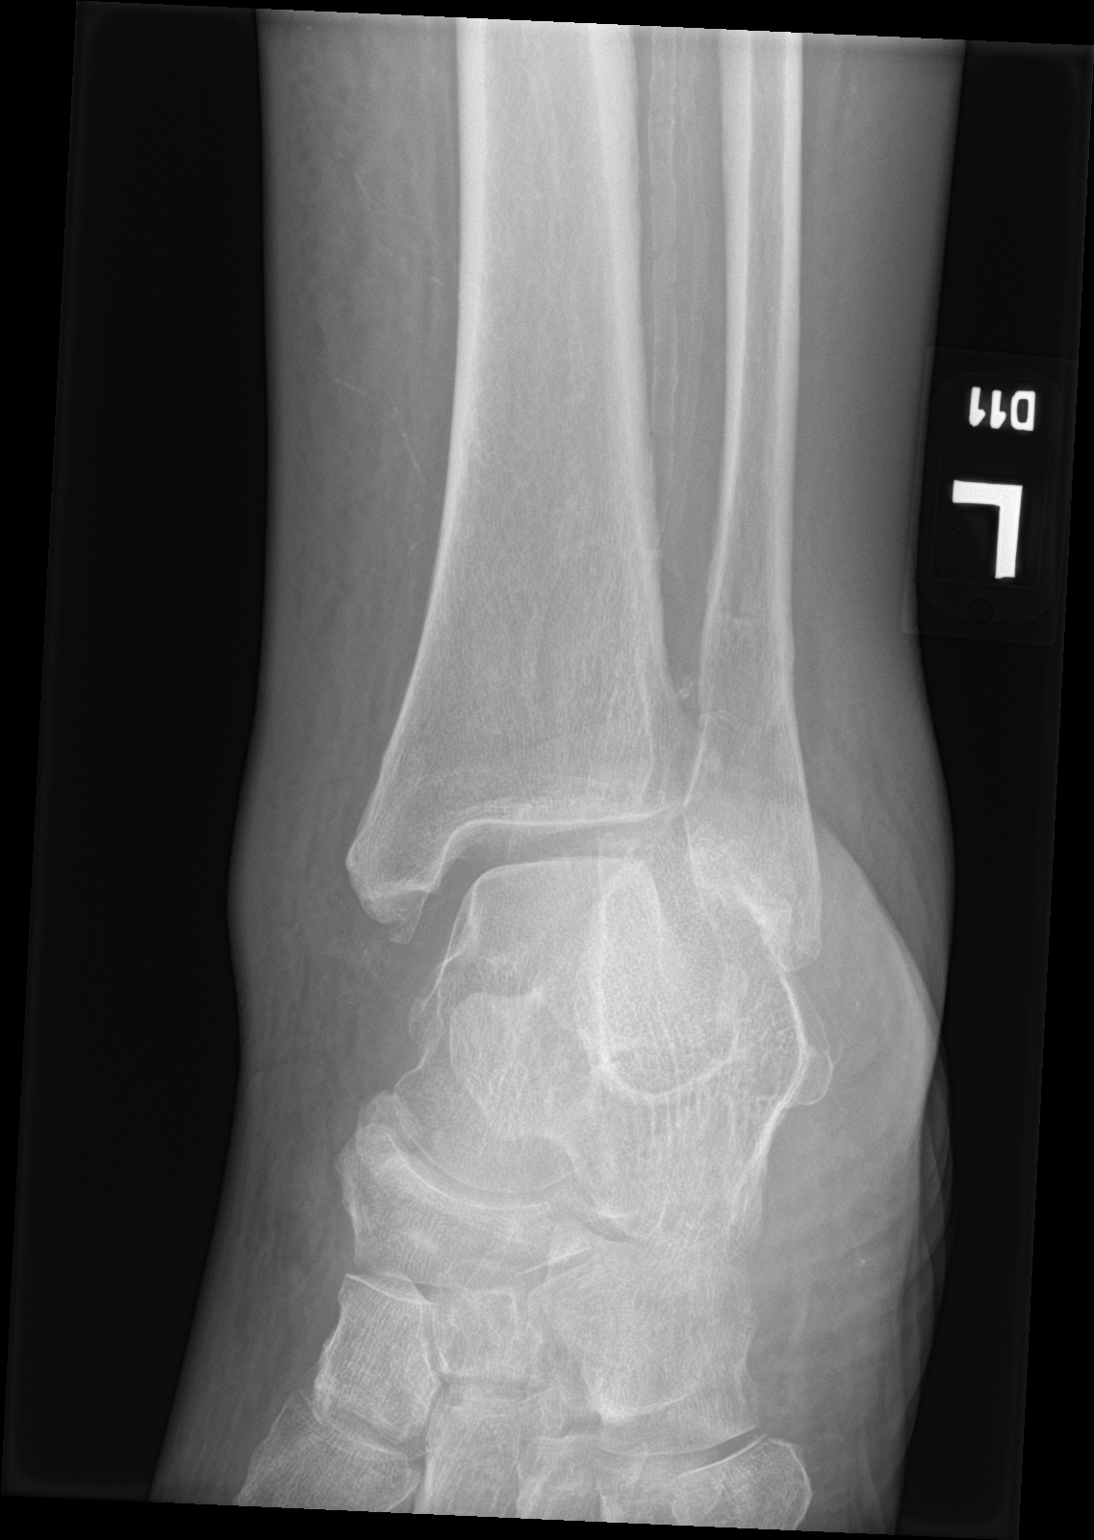

[ankle lat]
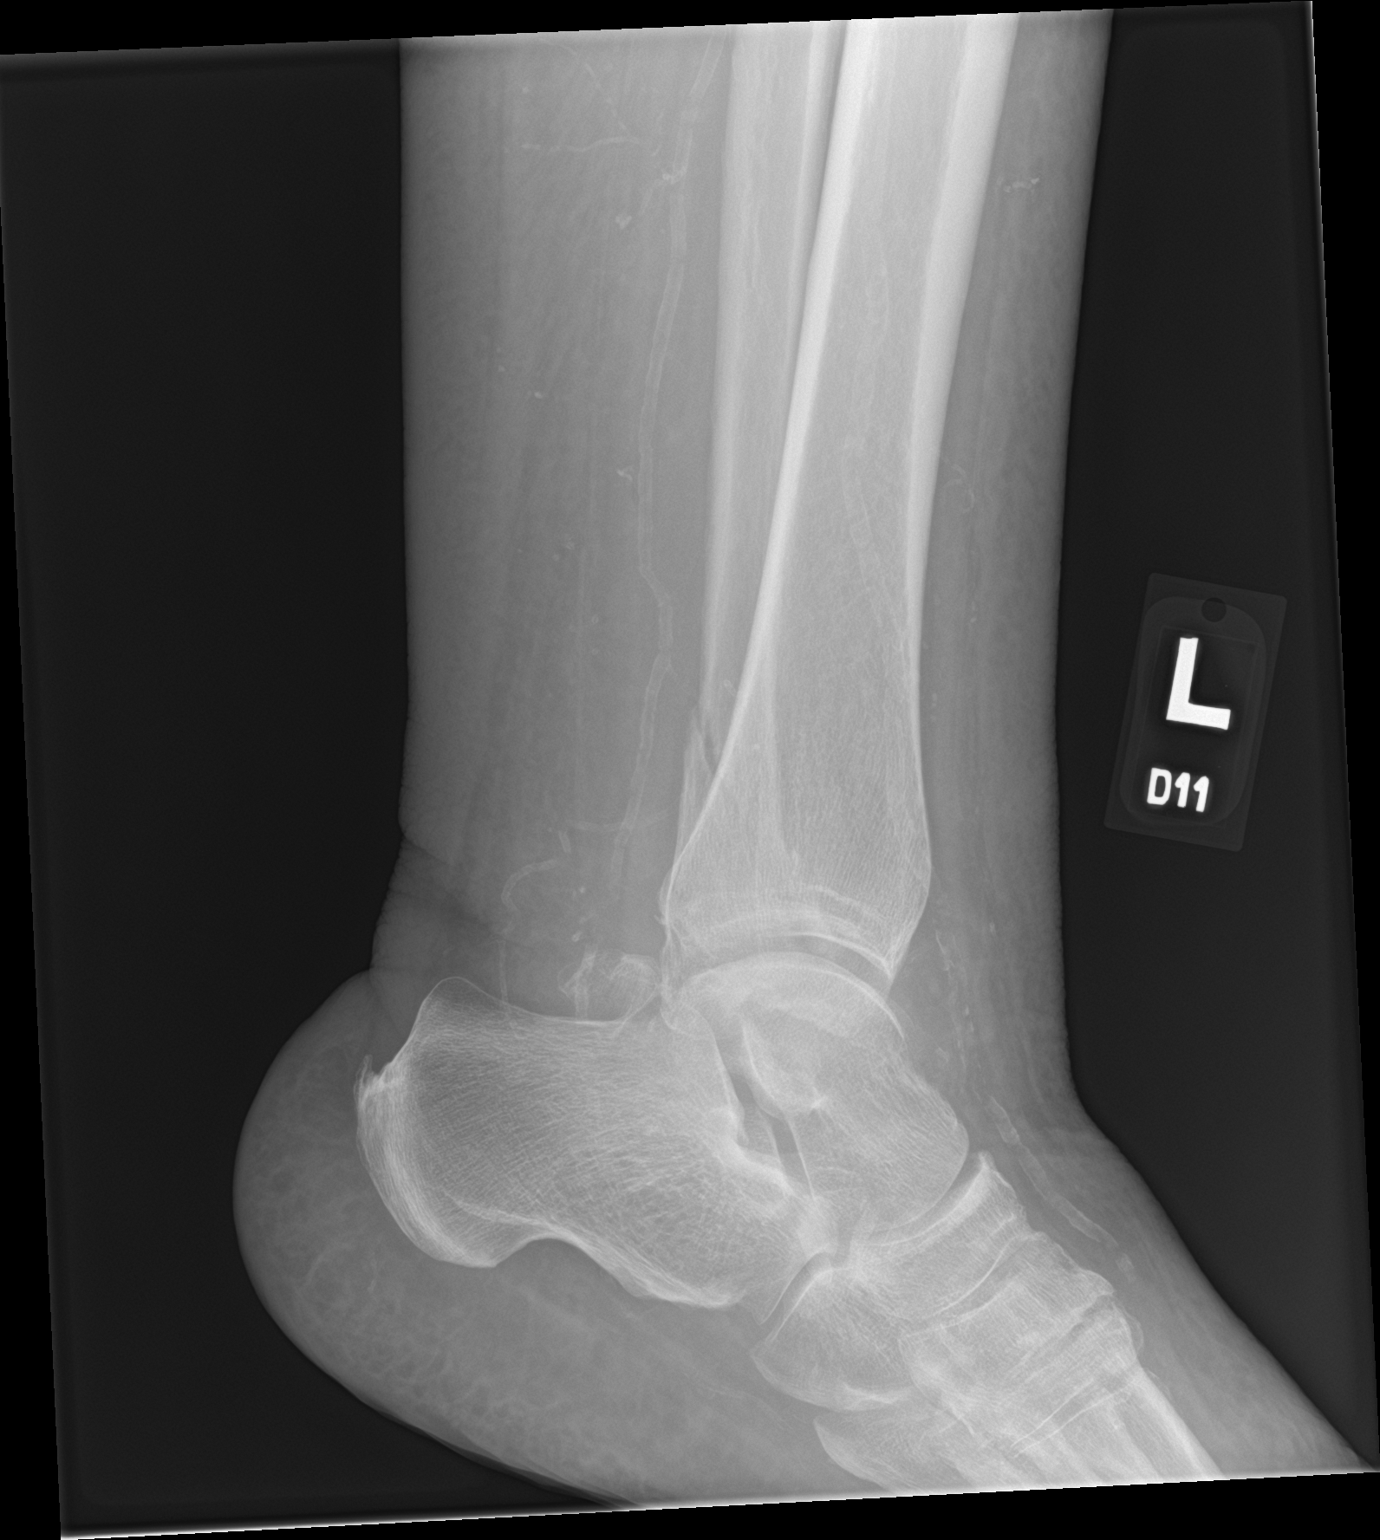

[3 of 3 positions shown; findings below may reference images not displayed]

FINDINGS: Diffuse soft tissue swelling lower LEFT leg, LEFT ankle and LEFT
foot.

Mild osseous demineralization.

Joint spaces preserved.

Prior amputation of great toe through the level of the distal first
metatarsal.

Nondisplaced oblique fracture distal fibula.

Small avulsion fracture suspected at tip of medial malleolus.

No additional fracture, dislocation, or bone destruction.

Scattered small vessel vascular calcifications.
IMPRESSION: Nondisplaced oblique distal fibular fracture.

Suspected avulsion fracture at tip of medial malleolus.

Marked soft tissue swelling.

Prior amputation first ray.

## 2019-09-20 IMAGING — CT CT KNEE*L* W/O CM
4 of 5 series · 14 of 33 positions shown, 17 images · non-contrast
Comparison: X-ray [DATE]

CLINICAL DATA: Left knee pain after fall.  Abnormal x-ray

EXAM:
CT OF THE LEFT KNEE WITHOUT CONTRAST
TECHNIQUE: Multidetector CT imaging of the left knee was performed according to
the standard protocol. Multiplanar CT image reconstructions were
also generated.

[Series 4: axial bone · axial · 0.31mm/px · z∈[+516,+623]mm · 5 of 161 slices shown, 7 images]
[im 27/161  soft-tissue]
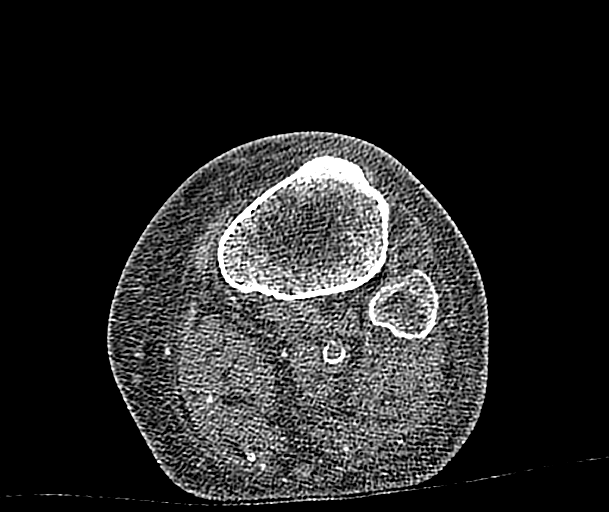
[im 27/161  bone]
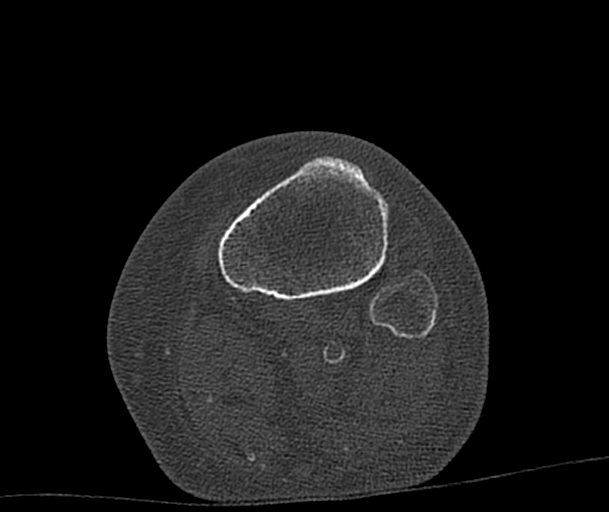
[im 54/161  bone]
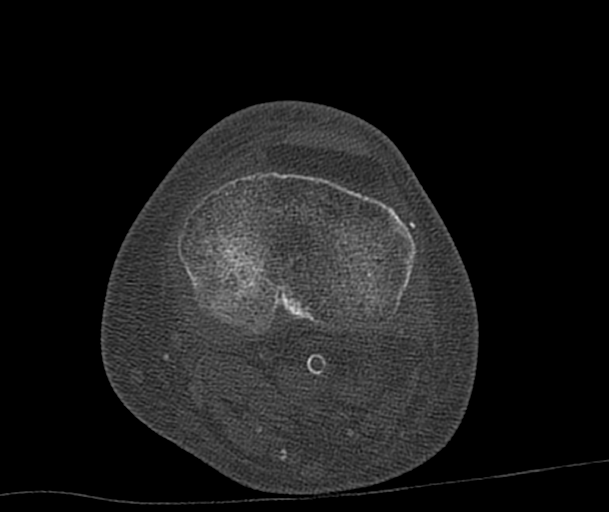
[im 81/161  bone]
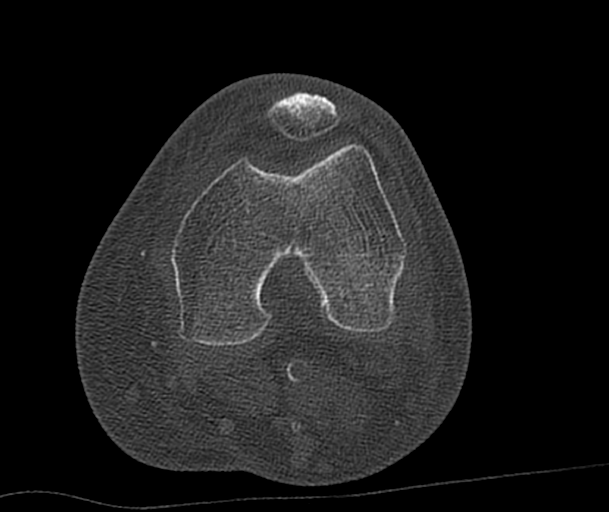
[im 107/161  bone]
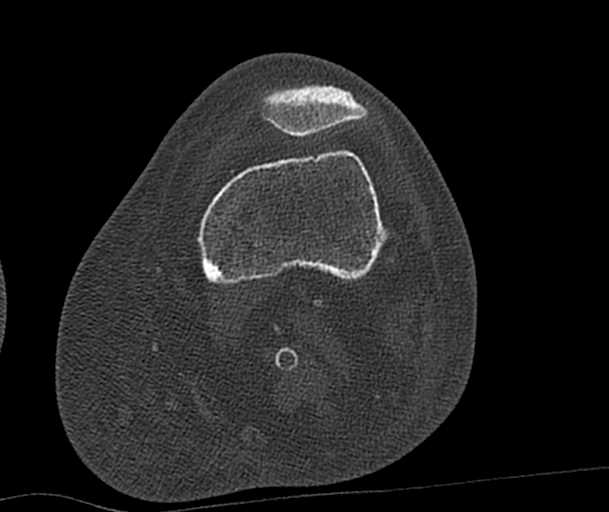
[im 134/161  soft-tissue]
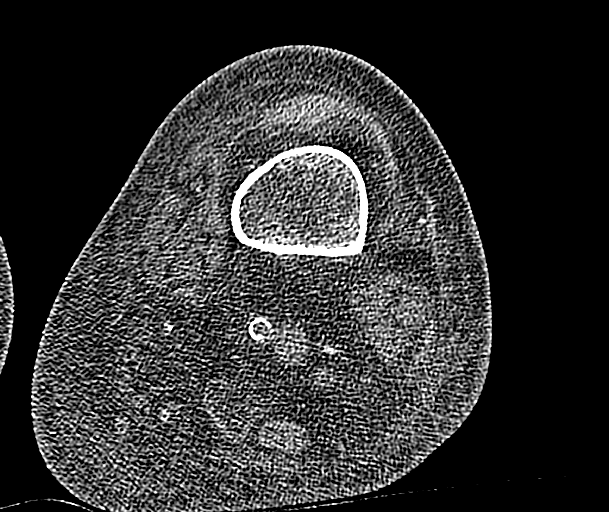
[im 134/161  bone]
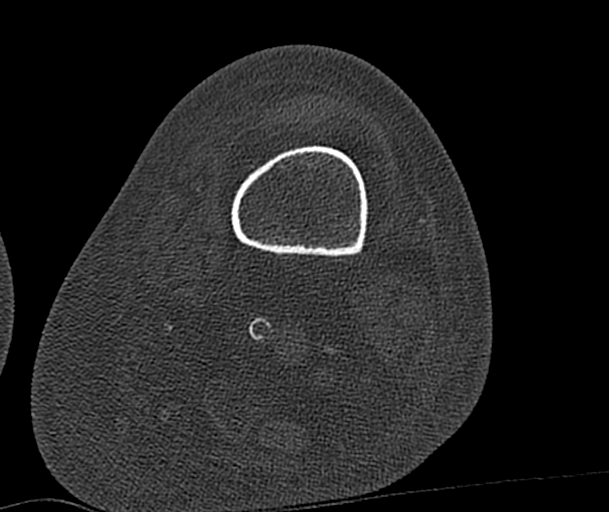

[Series 5: cor bone · coronal · 0.30mm/px · 1 of 158 slices shown]
[im 79/158  bone]
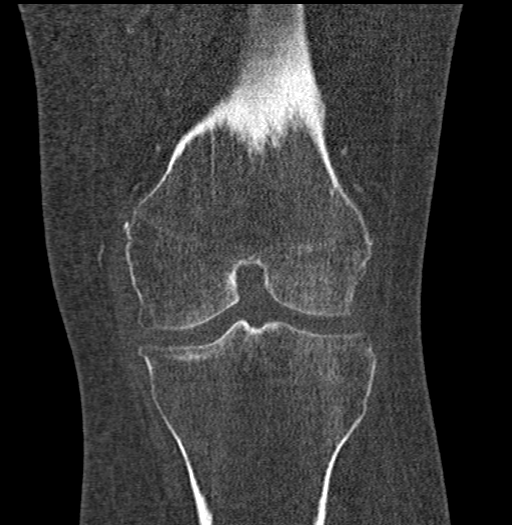

[Series 7: axial st · axial · 0.28mm/px · z∈[+504,+563]mm · 3 of 178 slices shown]
[im 30/178  bone]
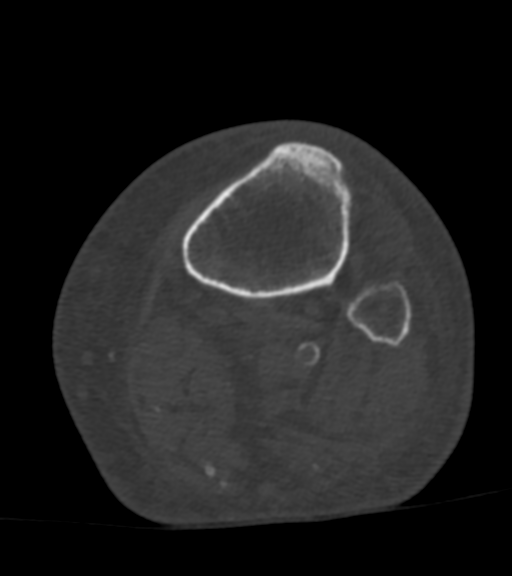
[im 60/178  bone]
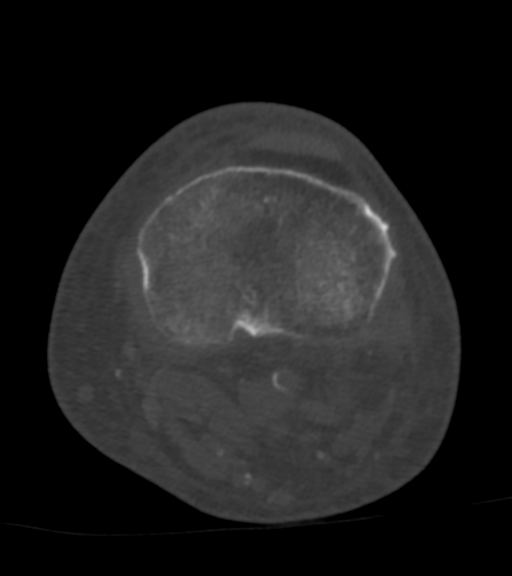
[im 89/178  bone]
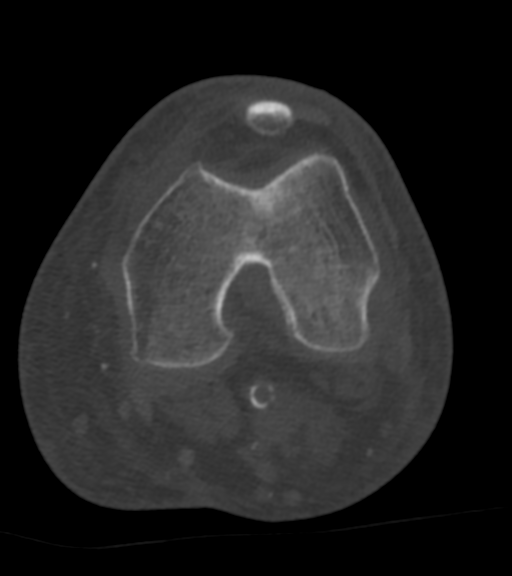

[Series 9: sag st · sagittal · 0.32mm/px · 5 of 160 slices shown, 6 images]
[im 54/160  bone]
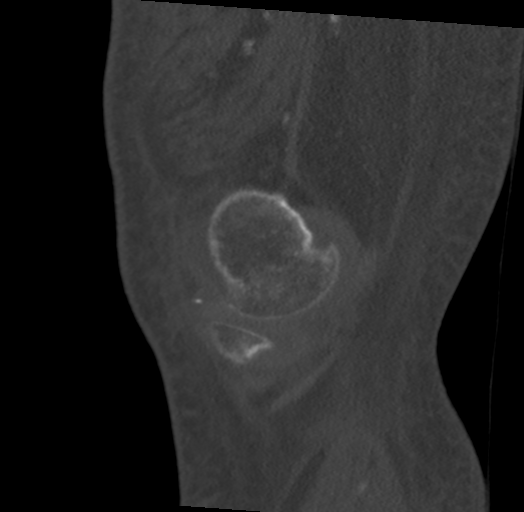
[im 67/160  bone]
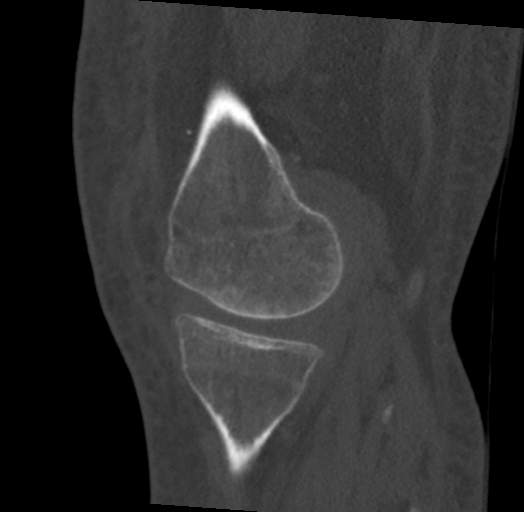
[im 80/160  soft-tissue]
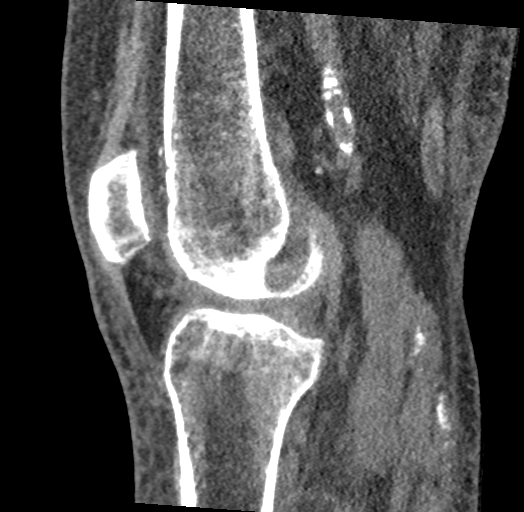
[im 80/160  bone]
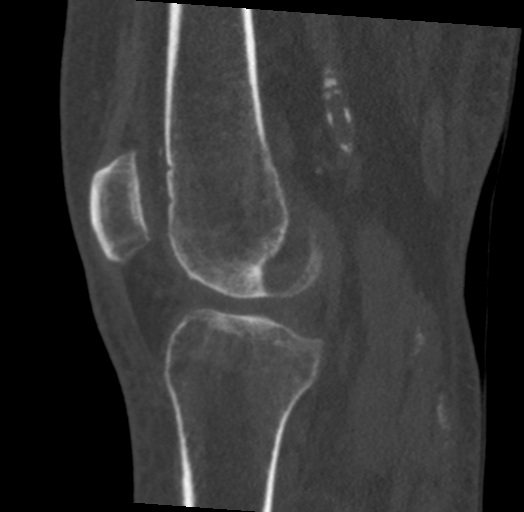
[im 93/160  bone]
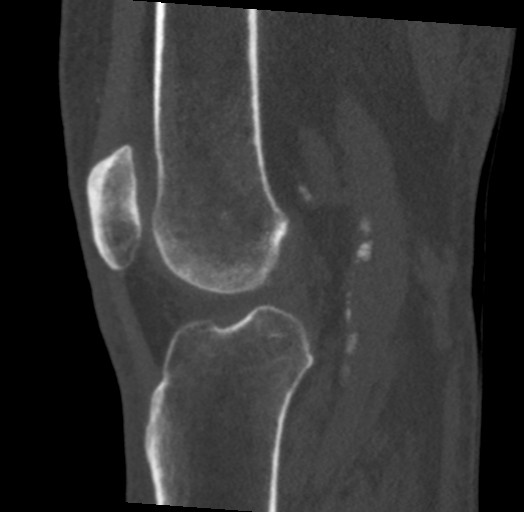
[im 107/160  bone]
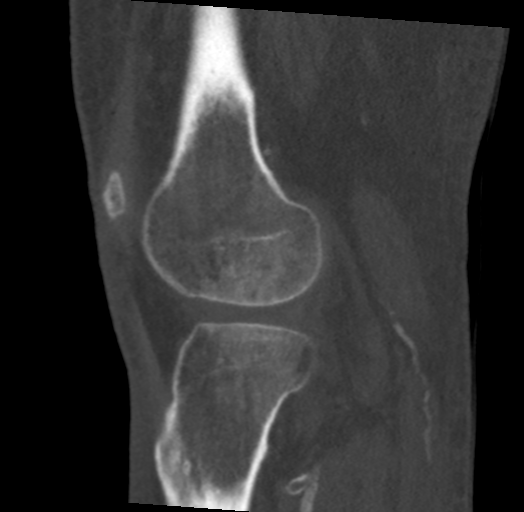

[14 of 33 positions shown; findings below may reference images not displayed]

FINDINGS: Bones/Joint/Cartilage

There is an acute nondisplaced obliquely oriented fracture involving
the fibular head and neck (series 5, images 87-101). Bandlike area
of increased density within the subchondral aspect of the medial
tibial plateau with slight angulation of the medial cortex
concerning for a minimally depressed trabecular fracture (series 5,
image 74). No discrete fracture line of the medial tibial plateau
extending to the superior articular surface. The distal femur and
patella are intact without fracture. Knee joint spaces are
preserved. There is no knee joint effusion or hemarthrosis.

Ligaments

Suboptimally assessed by CT. ACL and PCL appear grossly intact
within the limitations of CT.

Muscles and Tendons

No acute myotendinous abnormality.

Soft tissues

Subcutaneous edema without well-defined fluid collection or
hematoma. Age advanced vascular calcifications.
IMPRESSION: 1. Acute nondisplaced obliquely-oriented fracture involving the
fibular head and neck.
2. Bandlike area of increased density within the subchondral aspect
of the medial tibial plateau with slight angulation of the medial
cortex suspicious for a minimally depressed trabecular fracture. No
discrete fracture line of the medial tibial plateau extending to the
superior articular surface.
3. Age advanced arteriosclerosis.

## 2019-09-20 MED ORDER — HYDROCODONE-ACETAMINOPHEN 5-325 MG PO TABS
1.0000 | ORAL_TABLET | Freq: Once | ORAL | Status: AC
  Administered 2019-09-20: 1 via ORAL
  Filled 2019-09-20: qty 1

## 2019-09-20 MED ORDER — HYDROCODONE-ACETAMINOPHEN 5-325 MG PO TABS: 1.0000 | ORAL_TABLET | ORAL | 0 refills | Status: DC | PRN

## 2019-09-20 NOTE — ED Triage Notes (Signed)
Patient tripped and fell in her kitchen floor 2 days ago.  Patient at the time was unable to get up and called rescue squad for assistance.  Patient was at [facility] dialysis today and unable to complete due to increased bilateral leg pain.  Patient denies hitting her head.

## 2019-09-20 NOTE — Discharge Instructions (Addendum)
Do not try to put weight on your left leg.  You may use a wheelchair preferably, use walker if needed again did not bear weight. Elevate this leg to help with swelling, you may apply ice over your splint for 20 minutes at a time at least 3 times daily. Take Norco as needed as prescribed for pain in your leg. Follow-up with your dialysis center, return to ER for new or worsening symptoms. Follow-up with orthopedics, call on Monday to schedule an appointment to be seen next week.

## 2019-09-20 NOTE — ED Provider Notes (Signed)
  Face-to-face evaluation   History: She is here fo, r evaluation of fall 2 days ago.  She describes a mechanical fall, when she ran into a door frame while walking at nighttime.  Since then she has had difficulty bearing weight on the left foot.  She complains primarily of pain in her left ankle.  Physical exam: Obese, alert and cooperative.  Left ankle is tender and swollen.  It is grossly stable, to light stress.  Left knee has mild anterior and posterior tenderness, but no associated swelling, deformity, and she is able to flex it to about 50 degrees.  Medical screening examination/treatment/procedure(s) were conducted as a shared visit with non-physician practitioner(s) and myself.  I personally evaluated the patient during the encounter    Daleen Bo, MD 09/20/19 2014

## 2019-09-20 NOTE — ED Provider Notes (Signed)
[facility] EMERGENCY DEPARTMENT Provider Note   CSN: [id number] Arrival date & time: 09/20/19  1110     History Chief Complaint  Patient presents with  . Leg Pain    bilateral    Janet Mitchell is a 54 y.o. female.  54 year old female with history of end-stage renal disease on dialysis, attends dialysis Monday, Wednesday, Friday presents with complaint of left leg pain.  Patient states that she tripped over a door frame and fell 2 nights ago resulting in pain in her knee and ankle.  Patient has been ambulatory however pain is worse with bearing weight.  Also reports aching pain in her right leg.  Patient states her leg swelling is baseline to better than usual for her.  Patient went to dialysis today however was sent to the ER for evaluation of her leg injury. Denies [facility] or any other complaints or concerns.         Past Medical History:  Diagnosis Date  . Anemia   . Arthritis   . CHF (congestive heart failure) ([place])    pt states she has been cleared of heart failure/disease  . Chronic cholecystitis with calculus   . COVID-19 virus infection 03/2019  . ESRD (end stage renal disease) on dialysis [facility])    "peritoneal dialysis q hs" (03/09/2018)  . Headache    "a few/wk" (03/09/2018) - no longer having these  . History of blood transfusion 10/2017   "low blood count" (03/09/2018)  . Hypertension   . Spinal headache   . Type II diabetes mellitus [facility])     Patient Active Problem List   Diagnosis Date Noted  . Ascites 07/09/2019  . Acute blood loss anemia   . Hyperkalemia   . Hypotension   . Vertigo   . Black stool 04/04/2019  . Uremia, acute   . Weakness   . COVID-19 03/26/2019  . Chronic diarrhea 03/25/2019  . Uncontrolled type II diabetes mellitus with chronic kidney disease ([place])   . Hypokalemia   . Hyponatremia   . ESRD (end stage renal disease) ([place])   . Essential hypertension, benign 02/14/2019  . Class 2 severe obesity due to excess calories with  serious comorbidity and body mass index (BMI) of 37.0 to 37.9 in adult [facility]) 02/14/2019  . SBP (spontaneous bacterial peritonitis) ([place]) 12/18/2018  . Generalized abdominal pain 11/28/2018  . Rectal burning 08/27/2018  . Rectal bleeding 08/27/2018  . Diarrhea 05/22/2018  . Fluid overload 03/25/2018  . Microcytic anemia 03/25/2018  . Chronic cholecystitis with calculus 03/09/2018  . Type 2 diabetes mellitus with ESRD (end-stage renal disease) ([place]) 03/09/2018  . Pre-transplant evaluation for ESRD (end stage renal disease) 12/19/2017  . Symptomatic anemia 11/06/2017  . Essential hypertension 11/06/2017  . ESRD on peritoneal dialysis ([place]) 11/06/2017  . Diabetic macular edema ([place]) 04/10/2015  . Edema of lower extremity 04/10/2015  . Uncontrolled type 2 diabetes mellitus ([place]) 04/10/2015    Past Surgical History:  Procedure Laterality Date  . AMPUTATION TOE Left 2013   Great toe  . AV FISTULA PLACEMENT Left 07/22/2019   Procedure: LEFT ARM BASILIC ARTERIOVENOUS (AV) FISTULA CREATION;  Surgeon: Rosetta Posner, MD;  Location: [place];  Service: Vascular;  Laterality: Left;  . BIOPSY  10/24/2018   Procedure: BIOPSY;  Surgeon: Daneil Dolin, MD;  [facility];  Service: Endoscopy;;  right and left colon  . CATARACT EXTRACTION W/ INTRAOCULAR LENS IMPLANT Right   . CESAREAN SECTION  1994; 1998  .  CHOLECYSTECTOMY N/A 03/09/2018   Procedure: LAPAROSCOPIC CHOLECYSTECTOMY WITH INTRAOPERATIVE CHOLANGIOGRAM ERAS PATHWAY;  Surgeon: Donnie Mesa, MD;  Location: [place];  Service: General;  Laterality: N/A;  . COLONOSCOPY N/A 10/24/2018   Procedure: COLONOSCOPY;  Surgeon: Daneil Dolin, MD;  Location[facility];  Service: Endoscopy;  Laterality: N/A;  1:00pm  . EYE SURGERY  05/15/2018   Removal of blood in the globe (due to DM)  . FLEXIBLE SIGMOIDOSCOPY N/A 11/24/2017   Procedure: FLEXIBLE SIGMOIDOSCOPY;  Surgeon: Daneil Dolin, MD;  [facility];  Service: Endoscopy;   Laterality: N/A;  . IR FLUORO GUIDE CV LINE RIGHT  07/17/2019  . IR PARACENTESIS  07/09/2019  . IR US GUIDE VASC ACCESS RIGHT  07/17/2019  . LAPAROSCOPIC CHOLECYSTECTOMY  03/09/2018  . PERITONEAL CATHETER INSERTION  2017  . POLYPECTOMY  10/24/2018   Procedure: POLYPECTOMY;  Surgeon: Daneil Dolin, MD;  [facility];  Service: Endoscopy;;  . TOTAL HIP ARTHROPLASTY Left 1997     OB History    Gravida  2   Para  2   Term      Preterm      AB      Living  2     SAB      TAB      Ectopic      Multiple      Live Births              Family History  Problem Relation Age of Onset  . Heart disease Mother   . Thrombocytopenia Mother        TTP  . Heart failure Father   . Kidney disease Paternal Grandfather   . Colon cancer Neg Hx     Social History   Tobacco Use  . Smoking status: Passive Smoke Exposure - Never Smoker  . Smokeless tobacco: Never Used  Vaping Use  . Vaping Use: Never used  Substance Use Topics  . Alcohol use: Never  . Drug use: Never    Home Medications Prior to Admission medications   Medication Sig Start Date End Date Taking? Authorizing Provider  acetaminophen (TYLENOL) 325 MG tablet Take 650 mg by mouth every 6 (six) hours as needed (pain).    Yes [provider]  aspirin EC 81 MG tablet Take 1 tablet (81 mg total) by mouth daily. 08/06/19  Yes Thurnell Lose, MD  Blood Glucose Monitoring Suppl (ACCU-CHEK GUIDE) w/Device KIT 1 Piece by Does not apply route as directed. 02/28/19  Yes Nida, Marella Chimes, MD  calcitRIOL (ROCALTROL) 0.5 MCG capsule Take 1-2 capsules (0.5-1 mcg total) by mouth See admin instructions. 0.79mg on Mondays through Fridays and take 166m on Saturdays and Sundays 08/29/19  Yes Pahwani, Rinka R, MD  cetirizine (ZYRTEC) 10 MG tablet Take 1 tablet (10 mg total) by mouth daily. 08/29/19  Yes Pahwani, Rinka R, MD  glucose blood (ACCU-CHEK GUIDE) test strip Use as instructed 02/28/19  Yes Nida,  GeMarella ChimesMD  hydrALAZINE (APRESOLINE) 25 MG tablet Take 1 tablet (25 mg total) by mouth 3 (three) times daily. 08/29/19  Yes Pahwani, Rinka R, MD  insulin glargine, 2 Unit Dial, (TOUJEO MAX SOLOSTAR) 300 UNIT/ML Solostar Pen Inject 10 Units into the skin daily. 07/19/19  Yes Ghimire, ShHenreitta LeberMD  labetalol (NORMODYNE) 300 MG tablet Take 300 mg by mouth 2 (two) times daily.  05/06/19  Yes [provider]  losartan (COZAAR) 100 MG tablet Take 100 mg by  mouth daily.  05/06/19  Yes [provider]  multivitamin (RENA-VIT) TABS tablet Take 1 tablet by mouth at bedtime. 04/12/19  Yes Nicole Kindred A, DO  HYDROcodone-acetaminophen (NORCO/VICODIN) 5-325 MG tablet Take 1-2 tablets by mouth every 4 (four) hours as needed. 09/20/19   Tacy Learn, PA-C    Allergies    Patient has no known allergies.  Review of Systems   Review of Systems  Cardiovascular: Positive for leg swelling.  Musculoskeletal: Positive for arthralgias and gait problem.  Skin: Negative for rash and wound.  Neurological: Negative for weakness and numbness.    Physical Exam Updated Vital Signs BP (!) 160/82   Pulse 87   Temp 98.7 F (37.1 C) (Oral)   Resp 17   Ht _0  (1.727 m)   Wt 108.9 kg   SpO2 100%   BMI 36.49 kg/m   Physical Exam Vitals and nursing note reviewed.  Constitutional:      General: She is not in acute distress.    Appearance: She is well-developed. She is not diaphoretic.  HENT:     Head: Normocephalic and atraumatic.  Cardiovascular:     Pulses: Normal pulses.  Pulmonary:     Effort: Pulmonary effort is normal.  Musculoskeletal:        General: Tenderness present.     Right hip: Normal.     Left hip: Normal.     Right lower leg: Edema present.     Left lower leg: Edema present.     Comments: Tenderness to lateral right ankle, anterior right knee, along lateral right foot/5th metatarsal. No ecchymosis. No pain with palpation or ROM right leg.   Skin:    General:  Skin is warm and dry.     Findings: No erythema or rash.  Neurological:     Mental Status: She is alert and oriented to person, place, and time.     Sensory: No sensory deficit.  Psychiatric:        Behavior: Behavior normal.     ED Results / Procedures / Treatments   Labs (all labs ordered are listed, but only abnormal results are displayed) Labs Reviewed - No data to display  EKG None  Radiology DG Ankle Complete Left  Result Date: 09/20/2019 CLINICAL DATA:  [name] on Wednesday, pain and swelling LEFT ankle and LEFT foot, LEFT knee pain EXAM: LEFT ANKLE COMPLETE - 3+ VIEW COMPARISON:  None FINDINGS: Diffuse soft tissue swelling lower LEFT leg, LEFT ankle and LEFT foot. Mild osseous demineralization. Joint spaces preserved. Prior amputation of great toe through the level of the distal first metatarsal. Nondisplaced oblique fracture distal fibula. Small avulsion fracture suspected at tip of medial malleolus. No additional fracture, dislocation, or bone destruction. Scattered small vessel vascular calcifications. IMPRESSION: Nondisplaced oblique distal fibular fracture. Suspected avulsion fracture at tip of medial malleolus. Marked soft tissue swelling. Prior amputation first ray. Electronically Signed   By: Lavonia Dana M.D.   On: 09/20/2019 14:55   CT Knee Left Wo Contrast  Result Date: 09/20/2019 CLINICAL DATA:  Left knee pain after fall.  Abnormal x-ray EXAM: CT OF THE LEFT KNEE WITHOUT CONTRAST TECHNIQUE: Multidetector CT imaging of the left knee was performed according to the standard protocol. Multiplanar CT image reconstructions were also generated. COMPARISON:  X-ray 09/20/2019 FINDINGS: Bones/Joint/Cartilage There is an acute nondisplaced obliquely oriented fracture involving the fibular head and neck (series 5, images 87-101). Bandlike area of increased density within the subchondral aspect of the medial tibial plateau  with slight angulation of the medial cortex concerning for a  minimally depressed trabecular fracture (series 5, image 74). No discrete fracture line of the medial tibial plateau extending to the superior articular surface. The distal femur and patella are intact without fracture. Knee joint spaces are preserved. There is no knee joint effusion or hemarthrosis. Ligaments Suboptimally assessed by CT. ACL and PCL appear grossly intact within the limitations of CT. Muscles and Tendons No acute myotendinous abnormality. Soft tissues Subcutaneous edema without well-defined fluid collection or hematoma. Age advanced vascular calcifications. IMPRESSION: 1. Acute nondisplaced obliquely-oriented fracture involving the fibular head and neck. 2. Bandlike area of increased density within the subchondral aspect of the medial tibial plateau with slight angulation of the medial cortex suspicious for a minimally depressed trabecular fracture. No discrete fracture line of the medial tibial plateau extending to the superior articular surface. 3. Age advanced arteriosclerosis. Electronically Signed   By: Davina Poke D.O.   On: 09/20/2019 16:24   DG Knee Complete 4 Views Left  Result Date: 09/20/2019 CLINICAL DATA:  RIGHT knee pain, fell on Wednesday EXAM: LEFT KNEE - COMPLETE 4+ VIEW COMPARISON:  None FINDINGS: Osseous demineralization. Joint spaces preserved. Questionable subtle compression fracture of the medial tibial plateau. Slight concavity of the medial tibial plateau articular surface. No additional fracture, dislocation or bone destruction. Scattered atherosclerotic calcifications. No joint effusion. IMPRESSION: Question subtle fracture at the medial tibial plateau; further evaluation by CT recommended. Electronically Signed   By: Lavonia Dana M.D.   On: 09/20/2019 15:06   DG Foot Complete Left  Result Date: 09/20/2019 CLINICAL DATA:  [name] on Wednesday, pain and swelling in LEFT foot and ankle, LEFT knee pain EXAM: LEFT FOOT - COMPLETE 3+ VIEW COMPARISON:  09/17/2018  Correlation: LEFT ankle radiographs 09/20/2019 FINDINGS: Prior amputation of the first ray through the mid first metatarsal. Bones demineralized. Joint spaces preserved. Scattered soft tissue swelling and small vessel vascular calcification. Avulsion fracture at tip of medial malleolus. Previously identified distal fibular fracture not well demonstrated on this exam. On lateral view, a small curvilinear density is seen adjacent to the posterior margin of the distal tibia, appeared more corticated on ankle radiographs but less so on this exam, cannot completely exclude a tiny avulsion fracture at the posterior malleolus. No additional fracture, dislocation, or bone destruction. IMPRESSION: Significant soft tissue swelling LEFT foot/ankle. Avulsion fracture at tip of medial malleolus. Previously seen distal fibular fracture not well demonstrated on this exam. Questionable tiny avulsion fracture of posterior malleolus. Electronically Signed   By: Lavonia Dana M.D.   On: 09/20/2019 15:01    Procedures Procedures (including critical care time)  Medications Ordered in ED Medications  HYDROcodone-acetaminophen (NORCO/VICODIN) 5-325 MG per tablet 1 tablet (1 tablet Oral Given 09/20/19 1532)    ED Course  I have reviewed the triage vital signs and the nursing notes.  Pertinent labs & imaging results that were available during my care of the patient were reviewed by me and considered in my medical decision making (see chart for details).  Clinical Course as of Sep 19 1728  Fri Sep 20, 8147  904 54 year old female with history of end-stage renal disease on dialysis presents today with left leg pain after mechanical fall 2 days ago.  Patient has had difficulty bearing weight on this leg since her fall.  On exam has tenderness to her left knee, lateral ankle as well as lateral left foot. X-ray of the ankle shows a distal fibula fracture with question of  small avulsion off the medial malleolus.  X-ray of the  knee concerning for tibial plateau fracture and was followed with a CT of the knee which shows a fracture of the fibula head and neck with concern for depressed trabecular fracture of the tibial plateau. Case discussed with Dr. Alma Friendly on-call with orthopedics who has reviewed the images, recommends a posterior lower leg splint with stirrup.  Patient is to be made nonweightbearing. Discussed with the patient, she is able to remain nonweightbearing while at home, she reports an able-bodied husband at home, states she lives in a single floor home with 3 steps to get into the house which her husband can help her with.  Patient does have a wheelchair as well as a walker at home.  Plan is for patient to follow-up with orthopedics next week, she was given a prescription for Norco, advised to ice and elevate to help with pain and swelling as well.  Patient is to follow-up with her dialysis center, states that she was told today that her labs looked okay and she should be all right to wait for dialysis until Monday.   [LM]    Clinical Course User Index [LM] Roque Lias   MDM Rules/Calculators/A&P                         SPLINT APPLICATION Date/Time: 0:92 PM Authorized by: Tacy Learn Consent: Verbal consent obtained. Risks and benefits: risks, benefits and alternatives were discussed Consent given by: patient Splint applied by: myself with assistance from ER techs Location details: left lower leg Splint type: posterior splint with stirrup Supplies used: fiberglas OCL, stockinet, webril, ace Post-procedure: The splinted body part was neurovascularly unchanged following the procedure. Patient tolerance: Patient tolerated the procedure well with no immediate complications.    Final Clinical Impression(s) / ED Diagnoses Final diagnoses:  Closed fracture of neck of left fibula, initial encounter  Closed fracture of distal end of left fibula, unspecified fracture morphology, initial  encounter  Closed fracture of left tibial plateau, initial encounter    Rx / DC Orders ED Discharge Orders         Ordered    HYDROcodone-acetaminophen (NORCO/VICODIN) 5-325 MG tablet  Every 4 hours PRN     Discontinue  Reprint     09/20/19 1722           Tacy Learn, PA-C 09/20/19 1751    Daleen Bo, MD 09/20/19 2014

## 2019-09-27 DIAGNOSIS — N186 End stage renal disease: Secondary | ICD-10-CM | POA: Diagnosis not present

## 2019-09-27 DIAGNOSIS — D509 Iron deficiency anemia, unspecified: Secondary | ICD-10-CM | POA: Diagnosis not present

## 2019-09-27 DIAGNOSIS — D631 Anemia in chronic kidney disease: Secondary | ICD-10-CM | POA: Diagnosis not present

## 2019-09-27 DIAGNOSIS — E8779 Other fluid overload: Secondary | ICD-10-CM | POA: Diagnosis not present

## 2019-09-27 DIAGNOSIS — Z992 Dependence on renal dialysis: Secondary | ICD-10-CM | POA: Diagnosis not present

## 2019-09-27 DIAGNOSIS — N2581 Secondary hyperparathyroidism of renal origin: Secondary | ICD-10-CM | POA: Diagnosis not present

## 2019-09-30 DIAGNOSIS — D509 Iron deficiency anemia, unspecified: Secondary | ICD-10-CM | POA: Diagnosis not present

## 2019-09-30 DIAGNOSIS — Z992 Dependence on renal dialysis: Secondary | ICD-10-CM | POA: Diagnosis not present

## 2019-09-30 DIAGNOSIS — N186 End stage renal disease: Secondary | ICD-10-CM | POA: Diagnosis not present

## 2019-09-30 DIAGNOSIS — D631 Anemia in chronic kidney disease: Secondary | ICD-10-CM | POA: Diagnosis not present

## 2019-09-30 DIAGNOSIS — E8779 Other fluid overload: Secondary | ICD-10-CM | POA: Diagnosis not present

## 2019-09-30 DIAGNOSIS — N2581 Secondary hyperparathyroidism of renal origin: Secondary | ICD-10-CM | POA: Diagnosis not present

## 2019-10-01 ENCOUNTER — Other Ambulatory Visit (HOSPITAL_COMMUNITY): Payer: Medicare Other

## 2019-10-02 DIAGNOSIS — E8779 Other fluid overload: Secondary | ICD-10-CM | POA: Diagnosis not present

## 2019-10-02 DIAGNOSIS — D509 Iron deficiency anemia, unspecified: Secondary | ICD-10-CM | POA: Diagnosis not present

## 2019-10-02 DIAGNOSIS — N2581 Secondary hyperparathyroidism of renal origin: Secondary | ICD-10-CM | POA: Diagnosis not present

## 2019-10-02 DIAGNOSIS — Z992 Dependence on renal dialysis: Secondary | ICD-10-CM | POA: Diagnosis not present

## 2019-10-02 DIAGNOSIS — N186 End stage renal disease: Secondary | ICD-10-CM | POA: Diagnosis not present

## 2019-10-02 DIAGNOSIS — D631 Anemia in chronic kidney disease: Secondary | ICD-10-CM | POA: Diagnosis not present

## 2019-10-04 DIAGNOSIS — Z992 Dependence on renal dialysis: Secondary | ICD-10-CM | POA: Diagnosis not present

## 2019-10-04 DIAGNOSIS — N186 End stage renal disease: Secondary | ICD-10-CM | POA: Diagnosis not present

## 2019-10-04 DIAGNOSIS — D509 Iron deficiency anemia, unspecified: Secondary | ICD-10-CM | POA: Diagnosis not present

## 2019-10-04 DIAGNOSIS — N2581 Secondary hyperparathyroidism of renal origin: Secondary | ICD-10-CM | POA: Diagnosis not present

## 2019-10-04 DIAGNOSIS — D631 Anemia in chronic kidney disease: Secondary | ICD-10-CM | POA: Diagnosis not present

## 2019-10-04 DIAGNOSIS — E8779 Other fluid overload: Secondary | ICD-10-CM | POA: Diagnosis not present

## 2019-10-07 DIAGNOSIS — D509 Iron deficiency anemia, unspecified: Secondary | ICD-10-CM | POA: Diagnosis not present

## 2019-10-07 DIAGNOSIS — E119 Type 2 diabetes mellitus without complications: Secondary | ICD-10-CM | POA: Diagnosis not present

## 2019-10-07 DIAGNOSIS — D631 Anemia in chronic kidney disease: Secondary | ICD-10-CM | POA: Diagnosis not present

## 2019-10-07 DIAGNOSIS — Z992 Dependence on renal dialysis: Secondary | ICD-10-CM | POA: Diagnosis not present

## 2019-10-07 DIAGNOSIS — Z794 Long term (current) use of insulin: Secondary | ICD-10-CM | POA: Diagnosis not present

## 2019-10-07 DIAGNOSIS — N2581 Secondary hyperparathyroidism of renal origin: Secondary | ICD-10-CM | POA: Diagnosis not present

## 2019-10-07 DIAGNOSIS — S8252XA Displaced fracture of medial malleolus of left tibia, initial encounter for closed fracture: Secondary | ICD-10-CM | POA: Diagnosis not present

## 2019-10-07 DIAGNOSIS — E8779 Other fluid overload: Secondary | ICD-10-CM | POA: Diagnosis not present

## 2019-10-07 DIAGNOSIS — N186 End stage renal disease: Secondary | ICD-10-CM | POA: Diagnosis not present

## 2019-10-09 DIAGNOSIS — D509 Iron deficiency anemia, unspecified: Secondary | ICD-10-CM | POA: Diagnosis not present

## 2019-10-09 DIAGNOSIS — Z992 Dependence on renal dialysis: Secondary | ICD-10-CM | POA: Diagnosis not present

## 2019-10-09 DIAGNOSIS — N186 End stage renal disease: Secondary | ICD-10-CM | POA: Diagnosis not present

## 2019-10-09 DIAGNOSIS — E8779 Other fluid overload: Secondary | ICD-10-CM | POA: Diagnosis not present

## 2019-10-09 DIAGNOSIS — D631 Anemia in chronic kidney disease: Secondary | ICD-10-CM | POA: Diagnosis not present

## 2019-10-09 DIAGNOSIS — N2581 Secondary hyperparathyroidism of renal origin: Secondary | ICD-10-CM | POA: Diagnosis not present

## 2019-10-10 ENCOUNTER — Ambulatory Visit: Payer: Medicare Other | Admitting: Internal Medicine

## 2019-10-11 DIAGNOSIS — D509 Iron deficiency anemia, unspecified: Secondary | ICD-10-CM | POA: Diagnosis not present

## 2019-10-11 DIAGNOSIS — Z992 Dependence on renal dialysis: Secondary | ICD-10-CM | POA: Diagnosis not present

## 2019-10-11 DIAGNOSIS — N186 End stage renal disease: Secondary | ICD-10-CM | POA: Diagnosis not present

## 2019-10-11 DIAGNOSIS — E8779 Other fluid overload: Secondary | ICD-10-CM | POA: Diagnosis not present

## 2019-10-11 DIAGNOSIS — N2581 Secondary hyperparathyroidism of renal origin: Secondary | ICD-10-CM | POA: Diagnosis not present

## 2019-10-11 DIAGNOSIS — D631 Anemia in chronic kidney disease: Secondary | ICD-10-CM | POA: Diagnosis not present

## 2019-10-12 DIAGNOSIS — Z992 Dependence on renal dialysis: Secondary | ICD-10-CM | POA: Diagnosis not present

## 2019-10-12 DIAGNOSIS — N2581 Secondary hyperparathyroidism of renal origin: Secondary | ICD-10-CM | POA: Diagnosis not present

## 2019-10-12 DIAGNOSIS — N186 End stage renal disease: Secondary | ICD-10-CM | POA: Diagnosis not present

## 2019-10-12 DIAGNOSIS — D631 Anemia in chronic kidney disease: Secondary | ICD-10-CM | POA: Diagnosis not present

## 2019-10-12 DIAGNOSIS — D509 Iron deficiency anemia, unspecified: Secondary | ICD-10-CM | POA: Diagnosis not present

## 2019-10-12 DIAGNOSIS — E8779 Other fluid overload: Secondary | ICD-10-CM | POA: Diagnosis not present

## 2019-10-14 DIAGNOSIS — D631 Anemia in chronic kidney disease: Secondary | ICD-10-CM | POA: Diagnosis not present

## 2019-10-14 DIAGNOSIS — E8779 Other fluid overload: Secondary | ICD-10-CM | POA: Diagnosis not present

## 2019-10-14 DIAGNOSIS — N2581 Secondary hyperparathyroidism of renal origin: Secondary | ICD-10-CM | POA: Diagnosis not present

## 2019-10-14 DIAGNOSIS — Z992 Dependence on renal dialysis: Secondary | ICD-10-CM | POA: Diagnosis not present

## 2019-10-14 DIAGNOSIS — N186 End stage renal disease: Secondary | ICD-10-CM | POA: Diagnosis not present

## 2019-10-14 DIAGNOSIS — D509 Iron deficiency anemia, unspecified: Secondary | ICD-10-CM | POA: Diagnosis not present

## 2019-10-15 ENCOUNTER — Other Ambulatory Visit (HOSPITAL_COMMUNITY)
Admission: RE | Admit: 2019-10-15 | Discharge: 2019-10-15 | Disposition: A | Discharge status: Home / Self Care | Payer: Medicare Other | Source: Ambulatory Visit | Attending: Vascular Surgery | Admitting: Vascular Surgery

## 2019-10-15 DIAGNOSIS — Z20822 Contact with and (suspected) exposure to covid-19: Secondary | ICD-10-CM | POA: Insufficient documentation

## 2019-10-15 DIAGNOSIS — Z01812 Encounter for preprocedural laboratory examination: Secondary | ICD-10-CM | POA: Diagnosis not present

## 2019-10-15 LAB — SARS CORONAVIRUS 2 (TAT 6-24 HRS): SARS Coronavirus 2: NEGATIVE

## 2019-10-16 ENCOUNTER — Encounter (HOSPITAL_COMMUNITY): Payer: Self-pay | Admitting: Vascular Surgery

## 2019-10-16 DIAGNOSIS — D631 Anemia in chronic kidney disease: Secondary | ICD-10-CM | POA: Diagnosis not present

## 2019-10-16 DIAGNOSIS — N2581 Secondary hyperparathyroidism of renal origin: Secondary | ICD-10-CM | POA: Diagnosis not present

## 2019-10-16 DIAGNOSIS — D509 Iron deficiency anemia, unspecified: Secondary | ICD-10-CM | POA: Diagnosis not present

## 2019-10-16 DIAGNOSIS — E8779 Other fluid overload: Secondary | ICD-10-CM | POA: Diagnosis not present

## 2019-10-16 DIAGNOSIS — Z992 Dependence on renal dialysis: Secondary | ICD-10-CM | POA: Diagnosis not present

## 2019-10-16 DIAGNOSIS — N186 End stage renal disease: Secondary | ICD-10-CM | POA: Diagnosis not present

## 2019-10-16 NOTE — Progress Notes (Signed)
Patient denies shortness of breath, fever, cough or chest pain.  PCP - Dr Doristine Bosworth Cardiologist - n/a Nephrology - [place]  Chest x-ray - 07/09/19 (1V) EKG - 07/10/19 Stress Test - 08/31/19 ECHO - 07/09/19 Cardiac Cath - 11/14/18  Fasting Blood Sugar -  unknown Checks Blood Sugar __0_ times a day  . THE NIGHT BEFORE SURGERY, take 5 units of Toujeo insulin.     . If your blood sugar is less than 70 mg/dL, you will need to treat for low blood sugar: o Treat a low blood sugar (less than 70 mg/dL) with  cup of clear juice (cranberry or apple), 4 glucose tablets, OR glucose gel. o Recheck blood sugar in 15 minutes after treatment (to make sure it is greater than 70 mg/dL). If your blood sugar is not greater than 70 mg/dL on recheck, call [phone] for further instructions.  Anesthesia review: Yes  STOP now taking any Aspirin (unless otherwise instructed by your surgeon), Aleve, Naproxen, Ibuprofen, Motrin, Advil, Goody's, BC's, all herbal medications, fish oil, and all vitamins.   Coronavirus Screening Covid test  On 10/15/19 was negative.  Patient verbalized understanding of instructions that were given via phone.

## 2019-10-16 NOTE — Anesthesia Preprocedure Evaluation (Addendum)
Anesthesia Evaluation  Patient identified by MRN, date of birth, ID band Patient awake    Reviewed: Allergy & Precautions, NPO status , Patient's Chart, lab work & pertinent test results, reviewed documented beta blocker date and time   Airway Mallampati: III  TM Distance: >3 FB Neck ROM: Full    Dental  (+) Teeth Intact, Dental Advisory Given   Pulmonary  Snores at night, has had witnessed apneas- has never had sleep study    Pulmonary exam normal breath sounds clear to auscultation       Cardiovascular hypertension, Pt. on medications and Pt. on home beta blockers +CHF (grade 2 diastolic dysfunction)  Normal cardiovascular exam+ Valvular Problems/Murmurs (mild-mod TR)  Rhythm:Regular Rate:Normal  Echo 06/2019: 1. Left ventricular ejection fraction, by estimation, is 50 to 55%. The left ventricle has normal function. The left ventricle demonstrates  regional wall motion abnormalities (see scoring diagram/findings for  description). There is moderate concentric  left ventricular hypertrophy. Left ventricular diastolic parameters are  consistent with Grade II diastolic dysfunction (pseudonormalization).  Elevated left atrial pressure. There is mild hypokinesis of the left  ventricular, entire inferolateral wall.  2. Right ventricular systolic function is mildly reduced. The right  ventricular size is normal. There is moderately elevated pulmonary artery  systolic pressure.  3. Left atrial size was moderately dilated.  4. Right atrial size was mildly dilated.  5. The mitral valve is normal in structure. Trivial mitral valve  regurgitation.  6. Tricuspid valve regurgitation is mild to moderate.  7. The aortic valve is normal in structure. Aortic valve regurgitation is  trivial.  8. The inferior vena cava is dilated in size with >50% respiratory  variability, suggesting right atrial pressure of 8 mmHg.    Neuro/Psych   Headaches, negative psych ROS   GI/Hepatic negative GI ROS, Neg liver ROS,   Endo/Other  diabetes, Type 2, Insulin DependentObesity BMI 37 Last a1c 7 FS this AM 218  Renal/GU ESRF and DialysisRenal disease (HD M/W/F)  negative genitourinary   Musculoskeletal  (+) Arthritis , Osteoarthritis,    Abdominal (+) + obese,   Peds  Hematology  (+) Blood dyscrasia, anemia ,   Anesthesia Other Findings   Reproductive/Obstetrics negative OB ROS                           Anesthesia Physical Anesthesia Plan  ASA: IV  Anesthesia Plan: MAC and Regional   Post-op Pain Management:    Induction:   PONV Risk Score and Plan: 2 and Propofol infusion and TIVA  Airway Management Planned: Natural Airway and Simple Face Mask  Additional Equipment: None  Intra-op Plan:   Post-operative Plan:   Informed Consent: I have reviewed the patients History and Physical, chart, labs and discussed the procedure including the risks, benefits and alternatives for the proposed anesthesia with the patient or authorized representative who has indicated his/her understanding and acceptance.     Dental advisory given  Plan Discussed with: CRNA  Anesthesia Plan Comments:        Anesthesia Quick Evaluation

## 2019-10-17 ENCOUNTER — Ambulatory Visit (HOSPITAL_COMMUNITY): Payer: Medicare Other | Admitting: Physician Assistant

## 2019-10-17 ENCOUNTER — Encounter (HOSPITAL_COMMUNITY): Payer: Self-pay | Admitting: Vascular Surgery

## 2019-10-17 ENCOUNTER — Ambulatory Visit (HOSPITAL_COMMUNITY)
Admission: RE | Admit: 2019-10-17 | Discharge: 2019-10-17 | Disposition: A | Discharge status: Home / Self Care | Payer: Medicare Other | Attending: Vascular Surgery | Admitting: Vascular Surgery

## 2019-10-17 ENCOUNTER — Other Ambulatory Visit: Payer: Self-pay

## 2019-10-17 ENCOUNTER — Encounter (HOSPITAL_COMMUNITY)
Admission: RE | Disposition: A | Discharge status: Home / Self Care | Payer: Self-pay | Source: Home / Self Care | Attending: Vascular Surgery

## 2019-10-17 DIAGNOSIS — Z79899 Other long term (current) drug therapy: Secondary | ICD-10-CM | POA: Insufficient documentation

## 2019-10-17 DIAGNOSIS — N186 End stage renal disease: Secondary | ICD-10-CM

## 2019-10-17 DIAGNOSIS — Z992 Dependence on renal dialysis: Secondary | ICD-10-CM | POA: Diagnosis not present

## 2019-10-17 DIAGNOSIS — D631 Anemia in chronic kidney disease: Secondary | ICD-10-CM | POA: Diagnosis not present

## 2019-10-17 DIAGNOSIS — Z7982 Long term (current) use of aspirin: Secondary | ICD-10-CM | POA: Diagnosis not present

## 2019-10-17 DIAGNOSIS — E1122 Type 2 diabetes mellitus with diabetic chronic kidney disease: Secondary | ICD-10-CM | POA: Diagnosis not present

## 2019-10-17 DIAGNOSIS — I1311 Hypertensive heart and chronic kidney disease without heart failure, with stage 5 chronic kidney disease, or end stage renal disease: Secondary | ICD-10-CM | POA: Diagnosis not present

## 2019-10-17 DIAGNOSIS — Z794 Long term (current) use of insulin: Secondary | ICD-10-CM | POA: Insufficient documentation

## 2019-10-17 DIAGNOSIS — I509 Heart failure, unspecified: Secondary | ICD-10-CM | POA: Diagnosis not present

## 2019-10-17 DIAGNOSIS — N185 Chronic kidney disease, stage 5: Secondary | ICD-10-CM | POA: Diagnosis not present

## 2019-10-17 HISTORY — PX: BASCILIC VEIN TRANSPOSITION: SHX5742

## 2019-10-17 HISTORY — DX: Other seasonal allergic rhinitis: J30.2

## 2019-10-17 LAB — CBC
HCT: 29.4 % — ABNORMAL LOW (ref 36.0–46.0)
Hemoglobin: 8.5 g/dL — ABNORMAL LOW (ref 12.0–15.0)
MCH: 22.3 pg — ABNORMAL LOW (ref 26.0–34.0)
MCHC: 28.9 g/dL — ABNORMAL LOW (ref 30.0–36.0)
MCV: 77 fL — ABNORMAL LOW (ref 80.0–100.0)
Platelets: 167 10*3/uL (ref 150–400)
RBC: 3.82 MIL/uL — ABNORMAL LOW (ref 3.87–5.11)
RDW: 24.1 % — ABNORMAL HIGH (ref 11.5–15.5)
WBC: 4.8 10*3/uL (ref 4.0–10.5)
nRBC: 0.4 % — ABNORMAL HIGH (ref 0.0–0.2)

## 2019-10-17 LAB — POCT I-STAT, CHEM 8
BUN: 42 mg/dL — ABNORMAL HIGH (ref 6–20)
Calcium, Ion: 1.18 mmol/L (ref 1.15–1.40)
Chloride: 97 mmol/L — ABNORMAL LOW (ref 98–111)
Creatinine, Ser: 4.7 mg/dL — ABNORMAL HIGH (ref 0.44–1.00)
Glucose, Bld: 221 mg/dL — ABNORMAL HIGH (ref 70–99)
HCT: 33 % — ABNORMAL LOW (ref 36.0–46.0)
Hemoglobin: 11.2 g/dL — ABNORMAL LOW (ref 12.0–15.0)
Potassium: 4.7 mmol/L (ref 3.5–5.1)
Sodium: 138 mmol/L (ref 135–145)
TCO2: 29 mmol/L (ref 22–32)

## 2019-10-17 LAB — HCG, SERUM, QUALITATIVE: Preg, Serum: NEGATIVE

## 2019-10-17 LAB — GLUCOSE, CAPILLARY
Glucose-Capillary: 218 mg/dL — ABNORMAL HIGH (ref 70–99)
Glucose-Capillary: 207 mg/dL — ABNORMAL HIGH (ref 70–99)
Glucose-Capillary: 181 mg/dL — ABNORMAL HIGH (ref 70–99)

## 2019-10-17 SURGERY — TRANSPOSITION, VEIN, BASILIC
Anesthesia: Monitor Anesthesia Care | Site: Arm Upper | Laterality: Left

## 2019-10-17 MED ORDER — CEFAZOLIN SODIUM-DEXTROSE 2-4 GM/100ML-% IV SOLN
2.0000 g | INTRAVENOUS | Status: AC
  Administered 2019-10-17: 2 g via INTRAVENOUS
  Filled 2019-10-17: qty 100

## 2019-10-17 MED ORDER — LIDOCAINE 2% (20 MG/ML) 5 ML SYRINGE
INTRAMUSCULAR | Status: DC | PRN
  Administered 2019-10-17: 30 mg via INTRAVENOUS

## 2019-10-17 MED ORDER — SODIUM CHLORIDE 0.9 % IV SOLN: INTRAVENOUS | Status: DC | PRN

## 2019-10-17 MED ORDER — SODIUM CHLORIDE 0.9 % IV SOLN: INTRAVENOUS | Status: DC

## 2019-10-17 MED ORDER — INSULIN ASPART 100 UNIT/ML ~~LOC~~ SOLN
SUBCUTANEOUS | Status: DC | PRN
Start: 2019-10-17 — End: 2019-10-17
  Administered 2019-10-17: 10 [IU] via SUBCUTANEOUS

## 2019-10-17 MED ORDER — DEXAMETHASONE SODIUM PHOSPHATE 10 MG/ML IJ SOLN
INTRAMUSCULAR | Status: AC
  Filled 2019-10-17: qty 1

## 2019-10-17 MED ORDER — LIDOCAINE-EPINEPHRINE (PF) 1.5 %-1:200000 IJ SOLN
INTRAMUSCULAR | Status: DC | PRN
  Administered 2019-10-17: 30 mL via PERINEURAL

## 2019-10-17 MED ORDER — LIDOCAINE-EPINEPHRINE 0.5 %-1:200000 IJ SOLN
INTRAMUSCULAR | Status: AC
  Filled 2019-10-17: qty 1

## 2019-10-17 MED ORDER — ORAL CARE MOUTH RINSE: 15.0000 mL | Freq: Once | OROMUCOSAL | Status: AC

## 2019-10-17 MED ORDER — LACTATED RINGERS IV SOLN: INTRAVENOUS | Status: DC

## 2019-10-17 MED ORDER — PROPOFOL 10 MG/ML IV BOLUS
INTRAVENOUS | Status: DC | PRN
  Administered 2019-10-17: 10 mg via INTRAVENOUS

## 2019-10-17 MED ORDER — VASOPRESSIN 20 UNIT/ML IV SOLN
INTRAVENOUS | Status: AC
  Filled 2019-10-17: qty 1

## 2019-10-17 MED ORDER — FENTANYL CITRATE (PF) 100 MCG/2ML IJ SOLN: 25.0000 ug | INTRAMUSCULAR | Status: DC | PRN

## 2019-10-17 MED ORDER — CHLORHEXIDINE GLUCONATE 4 % EX LIQD: 60.0000 mL | Freq: Once | CUTANEOUS | Status: DC

## 2019-10-17 MED ORDER — PHENYLEPHRINE 40 MCG/ML (10ML) SYRINGE FOR IV PUSH (FOR BLOOD PRESSURE SUPPORT)
PREFILLED_SYRINGE | INTRAVENOUS | Status: DC | PRN
  Administered 2019-10-17: 80 ug via INTRAVENOUS

## 2019-10-17 MED ORDER — FENTANYL CITRATE (PF) 250 MCG/5ML IJ SOLN
INTRAMUSCULAR | Status: DC | PRN
  Administered 2019-10-17 (×2): 50 ug via INTRAVENOUS

## 2019-10-17 MED ORDER — PROPOFOL 10 MG/ML IV BOLUS
INTRAVENOUS | Status: AC
  Filled 2019-10-17: qty 20

## 2019-10-17 MED ORDER — FENTANYL CITRATE (PF) 250 MCG/5ML IJ SOLN
INTRAMUSCULAR | Status: AC
  Filled 2019-10-17: qty 5

## 2019-10-17 MED ORDER — 0.9 % SODIUM CHLORIDE (POUR BTL) OPTIME
TOPICAL | Status: DC | PRN
  Administered 2019-10-17: 1000 mL

## 2019-10-17 MED ORDER — HYDROCODONE-ACETAMINOPHEN 5-325 MG PO TABS: 1.0000 | ORAL_TABLET | Freq: Four times a day (QID) | ORAL | 0 refills | Status: DC | PRN

## 2019-10-17 MED ORDER — PROPOFOL 500 MG/50ML IV EMUL
INTRAVENOUS | Status: DC | PRN
  Administered 2019-10-17: 50 ug/kg/min via INTRAVENOUS

## 2019-10-17 MED ORDER — MIDAZOLAM HCL 2 MG/2ML IJ SOLN
INTRAMUSCULAR | Status: DC | PRN
  Administered 2019-10-17 (×2): 1 mg via INTRAVENOUS

## 2019-10-17 MED ORDER — EPHEDRINE SULFATE-NACL 50-0.9 MG/10ML-% IV SOSY
PREFILLED_SYRINGE | INTRAVENOUS | Status: DC | PRN
  Administered 2019-10-17: 10 mg via INTRAVENOUS

## 2019-10-17 MED ORDER — SODIUM CHLORIDE 0.9 % IV SOLN
INTRAVENOUS | Status: AC
  Filled 2019-10-17: qty 1.2

## 2019-10-17 MED ORDER — ONDANSETRON HCL 4 MG/2ML IJ SOLN
INTRAMUSCULAR | Status: AC
  Filled 2019-10-17: qty 2

## 2019-10-17 MED ORDER — INSULIN ASPART 100 UNIT/ML ~~LOC~~ SOLN
SUBCUTANEOUS | Status: AC
  Filled 2019-10-17: qty 1

## 2019-10-17 MED ORDER — PROMETHAZINE HCL 25 MG/ML IJ SOLN: 6.2500 mg | INTRAMUSCULAR | Status: DC | PRN

## 2019-10-17 MED ORDER — PHENYLEPHRINE HCL-NACL 10-0.9 MG/250ML-% IV SOLN
INTRAVENOUS | Status: DC | PRN
  Administered 2019-10-17: 20 ug/min via INTRAVENOUS

## 2019-10-17 MED ORDER — PROPOFOL 1000 MG/100ML IV EMUL
INTRAVENOUS | Status: AC
  Filled 2019-10-17: qty 100

## 2019-10-17 MED ORDER — MIDAZOLAM HCL 2 MG/2ML IJ SOLN
INTRAMUSCULAR | Status: AC
  Filled 2019-10-17: qty 2

## 2019-10-17 MED ORDER — ONDANSETRON HCL 4 MG/2ML IJ SOLN
INTRAMUSCULAR | Status: DC | PRN
  Administered 2019-10-17: 4 mg via INTRAVENOUS

## 2019-10-17 MED ORDER — VASOPRESSIN 20 UNIT/ML IV SOLN
INTRAVENOUS | Status: DC | PRN
Start: 2019-10-17 — End: 2019-10-17
  Administered 2019-10-17 (×2): 1 [IU] via INTRAVENOUS

## 2019-10-17 MED ORDER — ACETAMINOPHEN 500 MG PO TABS
1000.0000 mg | ORAL_TABLET | Freq: Once | ORAL | Status: AC
  Administered 2019-10-17: 1000 mg via ORAL
  Filled 2019-10-17: qty 2

## 2019-10-17 MED ORDER — LIDOCAINE-EPINEPHRINE 0.5 %-1:200000 IJ SOLN: INTRAMUSCULAR | Status: DC | PRN

## 2019-10-17 MED ORDER — CHLORHEXIDINE GLUCONATE 0.12 % MT SOLN
15.0000 mL | Freq: Once | OROMUCOSAL | Status: AC
  Administered 2019-10-17: 15 mL via OROMUCOSAL
  Filled 2019-10-17: qty 15

## 2019-10-17 SURGICAL SUPPLY — 41 items
ADH SKN CLS APL DERMABOND .7 (GAUZE/BANDAGES/DRESSINGS) ×1
ARMBAND PINK RESTRICT EXTREMIT (MISCELLANEOUS) ×2 IMPLANT
BNDG ELASTIC 4X5.8 VLCR STR LF (GAUZE/BANDAGES/DRESSINGS) ×1 IMPLANT
BNDG GAUZE ELAST 4 BULKY (GAUZE/BANDAGES/DRESSINGS) ×1 IMPLANT
CANISTER SUCT 3000ML PPV (MISCELLANEOUS) ×2 IMPLANT
CANNULA VESSEL 3MM 2 BLNT TIP (CANNULA) ×2 IMPLANT
CLIP LIGATING EXTRA MED SLVR (CLIP) ×2 IMPLANT
CLIP LIGATING EXTRA SM BLUE (MISCELLANEOUS) ×2 IMPLANT
COVER PROBE W GEL 5X96 (DRAPES) ×2 IMPLANT
COVER WAND RF STERILE (DRAPES) ×2 IMPLANT
DECANTER SPIKE VIAL GLASS SM (MISCELLANEOUS) ×2 IMPLANT
DERMABOND ADVANCED (GAUZE/BANDAGES/DRESSINGS) ×1
DERMABOND ADVANCED .7 DNX12 (GAUZE/BANDAGES/DRESSINGS) ×1 IMPLANT
ELECT REM PT RETURN 9FT ADLT (ELECTROSURGICAL) ×2
ELECTRODE REM PT RTRN 9FT ADLT (ELECTROSURGICAL) ×1 IMPLANT
GAUZE SPONGE 4X4 12PLY STRL (GAUZE/BANDAGES/DRESSINGS) ×1 IMPLANT
GLOVE BIO SURGEON STRL SZ 6.5 (GLOVE) ×1 IMPLANT
GLOVE BIO SURGEON STRL SZ7 (GLOVE) ×1 IMPLANT
GLOVE BIOGEL PI IND STRL 6 (GLOVE) IMPLANT
GLOVE BIOGEL PI IND STRL 6.5 (GLOVE) IMPLANT
GLOVE BIOGEL PI IND STRL 7.0 (GLOVE) IMPLANT
GLOVE BIOGEL PI INDICATOR 6 (GLOVE) ×1
GLOVE BIOGEL PI INDICATOR 6.5 (GLOVE) ×3
GLOVE BIOGEL PI INDICATOR 7.0 (GLOVE) ×1
GLOVE SS BIOGEL STRL SZ 7.5 (GLOVE) ×1 IMPLANT
GLOVE SUPERSENSE BIOGEL SZ 7.5 (GLOVE) ×1
GOWN STRL REUS W/ TWL LRG LVL3 (GOWN DISPOSABLE) ×3 IMPLANT
GOWN STRL REUS W/TWL LRG LVL3 (GOWN DISPOSABLE) ×6
KIT BASIN OR (CUSTOM PROCEDURE TRAY) ×2 IMPLANT
KIT TURNOVER KIT B (KITS) ×2 IMPLANT
NS IRRIG 1000ML POUR BTL (IV SOLUTION) ×2 IMPLANT
PACK CV ACCESS (CUSTOM PROCEDURE TRAY) ×2 IMPLANT
PAD ARMBOARD 7.5X6 YLW CONV (MISCELLANEOUS) ×4 IMPLANT
SUT PROLENE 5 0 C 1 24 (SUTURE) ×2 IMPLANT
SUT PROLENE 6 0 CC (SUTURE) ×2 IMPLANT
SUT SILK 2 0 SH (SUTURE) IMPLANT
SUT VIC AB 3-0 SH 27 (SUTURE) ×6
SUT VIC AB 3-0 SH 27X BRD (SUTURE) ×1 IMPLANT
TOWEL GREEN STERILE (TOWEL DISPOSABLE) ×2 IMPLANT
UNDERPAD 30X36 HEAVY ABSORB (UNDERPADS AND DIAPERS) ×2 IMPLANT
WATER STERILE IRR 1000ML POUR (IV SOLUTION) ×2 IMPLANT

## 2019-10-17 NOTE — Progress Notes (Signed)
Orthopedic Tech Progress Note Patient Details:  Janet Mitchell June 10, 1965 [id number]  Ortho Devices Type of Ortho Device: Sling immobilizer Ortho Device/Splint Interventions: Ordered     Delivered arm sling to patient per PACU nurse request. Sling was applied by PACU staff.  [name] 10/17/2019, 11:08 AM

## 2019-10-17 NOTE — Op Note (Signed)
    OPERATIVE REPORT  DATE OF SURGERY: 10/17/2019  PATIENT: Janet Mitchell, 54 y.o. female MRN: [id number]  DOB: Nov 04, 1965  PRE-OPERATIVE DIAGNOSIS: End-stage renal disease  POST-OPERATIVE DIAGNOSIS:  Same  PROCEDURE: Left second stage basilic vein transposition  SURGEON:  Curt Jews, M.D.  PHYSICIAN ASSISTANT: Baglia PAC   The assistant was needed for exposure and to expedite the case  ANESTHESIA: Axillary block with sedation  EBL: per anesthesia record  Total I/O In: 550 [I.V.:450; IV Piggyback:100] Out: 5 [Blood:5]  BLOOD ADMINISTERED: none  DRAINS: none  SPECIMEN: none  COUNTS CORRECT:  YES  PATIENT DISPOSITION:  PACU - hemodynamically stable  PROCEDURE DETAILS: Patient was taken to room placed supine position where the area of the left arm left axilla were prepped draped in sterile fashion.  SonoSite ultrasound was used to image the basilic vein from the antecubital anastomosis to the axilla.  The basilic vein was of excellent caliber.  Incision was made over the prior arteriovenous anastomosis and carried down to isolate the vein at the arterial anastomosis.  Several separate incisions were made over the medial aspect of the arm up to the axilla.  Tributary branches were ligated with 2-0 and 3-0 silk ties and divided.  The vein was occluded with a fistula clamp near the arterial anastomosis and the vein was transected.  The vein was brought out of the existing tunnel.  A tunnel was created from the antecubital space to the axilla and the subcutaneous tissue.  The vein was brought back through this tunnel.  The vein was marked to reduce risk of twisting.  The vein was cut to the appropriate length and was spatulated and sewn into into itself near the arteriovenous anastomosis with a running 6-0 Prolene suture.  Clamps removed and excellent thrill was noted.  The wounds irrigated with saline.  Hemostasis obtained after cautery.  Wounds were closed with 3-0 Vicryl in the  subcutaneous and subcuticular tissue.  Sterile dressing was applied and the patient was transferred to the recovery room in stable condition   Rosetta Posner, M.D., [facility] 10/17/2019 10:07 AM

## 2019-10-17 NOTE — H&P (Signed)
Gabriel Earing, PA-C  Physician Assistant  Vascular Surgery  Progress Notes    Signed  Encounter Date:  08/27/2019          Signed         Show:Clear all '[x]' Manual'[x]' Template'[x]' Copied  Added by: '[x]' Rhyne, Hulen Shouts, PA-C  '[]' Hover for details  POST OPERATIVE OFFICE NOTE    CC:  F/u for surgery  HPI:  This is a 54 y.o. female who is s/p left brachio-basilic fistula on 9/57/4734 by Dr. Donnetta Hutching.   She had a TDC placed by IR on 07/17/2019.  The pt is on hemodialysis as she recently converted from PD as she had peritonitis. She dialyzes on M/W/F at [facility] in [place].  She states that she has some numbness in the left 4th and 5th fingers as well as some numbness down her forearm on the medial side.  She states her fingers feel like they are asleep.   This is unchanged from surgery.  She has her PD catheter checked on 6/8.  She is hoping to go back on PD sometime in the future.   No Known Allergies        Current Outpatient Medications  Medication Sig Dispense Refill  . acetaminophen (TYLENOL) 325 MG tablet Take 650 mg by mouth every 6 (six) hours as needed (pain).    Marland Kitchen aspirin EC 81 MG tablet Take 1 tablet (81 mg total) by mouth daily.    . Blood Glucose Monitoring Suppl (ACCU-CHEK GUIDE) w/Device KIT 1 Piece by Does not apply route as directed. 1 kit 0  . calcitRIOL (ROCALTROL) 0.5 MCG capsule Take 0.5-1 mcg by mouth See admin instructions. 0.85mg on Mondays through Fridays and take 154m on Saturdays and 'Sundays    . glucose blood (ACCU-CHEK GUIDE) test strip Use as instructed 150 each 2  . HYDROcodone-acetaminophen (NORCO) 5-325 MG tablet Take 1 tablet by mouth every 6 (six) hours as needed for moderate pain. 12 tablet 0  . insulin glargine, 2 Unit Dial, (TOUJEO MAX SOLOSTAR) 300 UNIT/ML Solostar Pen Inject 10 Units into the skin daily.    . labetalol (NORMODYNE) 300 MG tablet Take 300 mg by mouth 2 (two) times daily.     . losartan (COZAAR) 100 MG  tablet Take 100 mg by mouth daily.     . multivitamin (RENA-VIT) TABS tablet Take 1 tablet by mouth at bedtime. 30 tablet 0   No current facility-administered medications for this visit.     ROS:  See HPI  Physical Exam:  Incision:  Both incisions have healed nicely Extremities:  There is not a palpable left radial pulse.  Motor and sensory are in tact.  Temperature of both hands are equal.  Bilateral hand grips are equal  There is a thrill present.    Dialysis Duplex on 08/27/2019: +------------+----------+-------------+----------+--------+  OUTFLOW VEINPSV (cm/s)Diameter (cm)Depth (cm)Describe  +------------+----------+-------------+----------+--------+  Prox UA     105    1.00     1.11        +------------+----------+-------------+----------+--------+  Mid UA     151    0.80     0.92        +------------+----------+-------------+----------+--------+  Dist UA     24' 1    0.99     0.73        +------------+----------+-------------+----------+--------+  AC Fossa    296    0.84     0.38        +------------+----------+-------------+----------+--------+  Left radial artery peak systolic velocity at  the wrist increased from 31.5  cm/sec to 70.9 cm/sec with AVF compression.    Assessment/Plan:  This is a 54 y.o. female who is s/p:  left brachio-basilic fistula on 9/40/9828 by Dr. Donnetta Hutching  -the pt does not have evidence of steal.  She does have some numbness in the left 4th and 5th fingers as well as down the medial side of the left forearm.  This most likely is due to nerve irritation and not steal sx given it is only 2 fingers. I discussed with Dr. Donnetta Hutching and he agrees.   -will proceed with 2nd stage left BVT on a T/T with Dr. Donnetta Hutching in the near future.       Leontine Locket, [facility] [phone]  Clinic MD:  Dazhane Villagomez        Addendum:  The patient  has been re-examined and re-evaluated.  The patient's history and physical has been reviewed and is unchanged.    Jazmyne Beauchesne Pinnix is a 54 y.o. female is being admitted with END STAGE RENAL DISEASE. All the risks, benefits and other treatment options have been discussed with the patient. The patient has consented to proceed with Procedure(s): LEFT SECOND STAGE [place] as a surgical intervention.  Emila Steinhauser 10/17/2019 7:26 AM Vascular and Vein Surgery

## 2019-10-17 NOTE — Transfer of Care (Signed)
Immediate Anesthesia Transfer of Care Note  Patient: Janet Mitchell  Procedure(s) Performed: LEFT SECOND STAGE BASCILIC VEIN TRANSPOSITION (Left Arm Upper)  Patient Location: PACU  Anesthesia Type:MAC and Regional  Level of Consciousness: drowsy and patient cooperative  Airway & Oxygen Therapy: Patient Spontanous Breathing and Patient connected to nasal cannula oxygen  Post-op Assessment: Report given to RN and Post -op Vital signs reviewed and stable  Post vital signs: Reviewed and stable  Last Vitals:  Vitals Value Taken Time  BP    Temp    Pulse    Resp    SpO2      Last Pain:  Vitals:   10/17/19 0947  TempSrc:   PainSc: (P) Asleep      Patients Stated Pain Goal: 4 (81/01/75 1025)  Complications: No complications documented.

## 2019-10-17 NOTE — Anesthesia Procedure Notes (Signed)
Procedure Name: MAC Date/Time: 10/17/2019 7:45 AM Performed by: Darletta Moll, CRNA Pre-anesthesia Checklist: Patient identified, Emergency Drugs available, Suction available and Patient being monitored Patient Re-evaluated:Patient Re-evaluated prior to induction Oxygen Delivery Method: Simple face mask

## 2019-10-17 NOTE — Anesthesia Procedure Notes (Signed)
Anesthesia Regional Block: Supraclavicular block   Pre-Anesthetic Checklist: ,, timeout performed, Correct Patient, Correct Site, Correct Laterality, Correct Procedure, Correct Position, site marked, Risks and benefits discussed,  Surgical consent,  Pre-op evaluation,  At surgeon's request and post-op pain management  Laterality: Left  Prep: Maximum Sterile Barrier Precautions used, chloraprep       Needles:  Injection technique: Single-shot  Needle Type: Echogenic Stimulator Needle     Needle Length: 9cm  Needle Gauge: 22     Additional Needles:   Procedures:,,,, ultrasound used (permanent image in chart),,,,  Narrative:  Start time: 10/17/2019 7:10 AM End time: 10/17/2019 7:16 AM Injection made incrementally with aspirations every 5 mL.  Performed by: Personally  Anesthesiologist: Pervis Hocking, DO  Additional Notes: Monitors applied. No increased pain on injection. No increased resistance to injection. Injection made in 5cc increments. Good needle visualization. Patient tolerated procedure well.   Supraclavicular and intercostobrachial nerve block

## 2019-10-17 NOTE — Anesthesia Postprocedure Evaluation (Signed)
Anesthesia Post Note  Patient: Bernedette Auston Pinnix  Procedure(s) Performed: LEFT SECOND STAGE BASCILIC VEIN TRANSPOSITION (Left Arm Upper)     Patient location during evaluation: PACU Anesthesia Type: Regional and MAC Level of consciousness: awake and alert Pain management: pain level controlled Vital Signs Assessment: post-procedure vital signs reviewed and stable Respiratory status: spontaneous breathing, nonlabored ventilation and respiratory function stable Cardiovascular status: blood pressure returned to baseline and stable Postop Assessment: no apparent nausea or vomiting Anesthetic complications: no   No complications documented.  Last Vitals:  Vitals:   10/17/19 0947 10/17/19 1002  BP: (!) 125/58 132/71  Pulse: 70 73  Resp: 20 19  Temp: (!) 36.1 C   SpO2: 97% 99%    Last Pain:  Vitals:   10/17/19 0947  TempSrc:   PainSc: [place]

## 2019-10-17 NOTE — Discharge Instructions (Signed)
[facility]  Discharge Instructions  AV Fistula or Graft Surgery for Dialysis Access  Please refer to the following instructions for your post-procedure care. Your surgeon or physician assistant will discuss any changes with you.  Activity  You may drive the day following your surgery, if you are comfortable and no longer taking prescription pain medication. Resume full activity as the soreness in your incision resolves.  Bathing/Showering  You may shower after you go home. Keep your incision dry for 48 hours. Do not soak in a bathtub, hot tub, or swim until the incision heals completely. You may not shower if you have a hemodialysis catheter.  Incision Care  Clean your incision with mild soap and water after 48 hours. Pat the area dry with a clean towel. You do not need a bandage unless otherwise instructed. Do not apply any ointments or creams to your incision. You may have skin glue on your incision. Do not peel it off. It will come off on its own in about one week. Your arm may swell a bit after surgery. To reduce swelling use pillows to elevate your arm so it is above your heart. Your doctor will tell you if you need to lightly wrap your arm with an ACE bandage.  Diet  Resume your normal diet. There are not special food restrictions following this procedure. In order to heal from your surgery, it is CRITICAL to get adequate nutrition. Your body requires vitamins, minerals, and protein. Vegetables are the best source of vitamins and minerals. Vegetables also provide the perfect balance of protein. Processed food has little nutritional value, so try to avoid this.  Medications  Resume taking all of your medications. If your incision is causing pain, you may take over-the counter pain relievers such as acetaminophen (Tylenol). If you were prescribed a stronger pain medication, please be aware these medications can cause nausea and constipation. Prevent  nausea by taking the medication with a snack or meal. Avoid constipation by drinking plenty of fluids and eating foods with high amount of fiber, such as fruits, vegetables, and grains.  Do not take Tylenol if you are taking prescription pain medications.  Follow up Your surgeon may want to see you in the office following your access surgery. If so, this will be arranged at the time of your surgery.  Please call us immediately for any of the following conditions:  . Increased pain, redness, drainage (pus) from your incision site . Fever of 101 degrees or higher . Severe or worsening pain at your incision site . Hand pain or numbness. .  Reduce your risk of vascular disease:  . Stop smoking. If you would like help, call [facility] at 1-800-QUIT-NOW 857 071 1526) or [facility] at [phone]  . Manage your cholesterol . Maintain a desired weight . Control your diabetes . Keep your blood pressure down  Dialysis  It will take several weeks to several months for your new dialysis access to be ready for use. Your surgeon will determine when it is okay to use it. Your nephrologist will continue to direct your dialysis. You can continue to use your Permcath until your new access is ready for use.   10/17/2019 Janet Mitchell [id number] 04-13-1965  Surgeon(s): Early, Arvilla Meres, MD  Procedure(s): LEFT SECOND STAGE BASCILIC VEIN TRANSPOSITION   May stick graft immediately   May stick graft on designated area only:   X Do not stick let AV fistula for 4 weeks  If you have any questions, please call the office at [phone].

## 2019-10-18 ENCOUNTER — Encounter (HOSPITAL_COMMUNITY): Payer: Self-pay | Admitting: Vascular Surgery

## 2019-10-18 DIAGNOSIS — E8779 Other fluid overload: Secondary | ICD-10-CM | POA: Diagnosis not present

## 2019-10-18 DIAGNOSIS — D631 Anemia in chronic kidney disease: Secondary | ICD-10-CM | POA: Diagnosis not present

## 2019-10-18 DIAGNOSIS — N2581 Secondary hyperparathyroidism of renal origin: Secondary | ICD-10-CM | POA: Diagnosis not present

## 2019-10-18 DIAGNOSIS — Z992 Dependence on renal dialysis: Secondary | ICD-10-CM | POA: Diagnosis not present

## 2019-10-18 DIAGNOSIS — N186 End stage renal disease: Secondary | ICD-10-CM | POA: Diagnosis not present

## 2019-10-18 DIAGNOSIS — D509 Iron deficiency anemia, unspecified: Secondary | ICD-10-CM | POA: Diagnosis not present

## 2019-10-21 DIAGNOSIS — D631 Anemia in chronic kidney disease: Secondary | ICD-10-CM | POA: Diagnosis not present

## 2019-10-21 DIAGNOSIS — D509 Iron deficiency anemia, unspecified: Secondary | ICD-10-CM | POA: Diagnosis not present

## 2019-10-21 DIAGNOSIS — E8779 Other fluid overload: Secondary | ICD-10-CM | POA: Diagnosis not present

## 2019-10-21 DIAGNOSIS — N2581 Secondary hyperparathyroidism of renal origin: Secondary | ICD-10-CM | POA: Diagnosis not present

## 2019-10-21 DIAGNOSIS — M25572 Pain in left ankle and joints of left foot: Secondary | ICD-10-CM | POA: Diagnosis not present

## 2019-10-21 DIAGNOSIS — Z992 Dependence on renal dialysis: Secondary | ICD-10-CM | POA: Diagnosis not present

## 2019-10-21 DIAGNOSIS — N186 End stage renal disease: Secondary | ICD-10-CM | POA: Diagnosis not present

## 2019-10-23 DIAGNOSIS — N186 End stage renal disease: Secondary | ICD-10-CM | POA: Diagnosis not present

## 2019-10-23 DIAGNOSIS — E8779 Other fluid overload: Secondary | ICD-10-CM | POA: Diagnosis not present

## 2019-10-23 DIAGNOSIS — N2581 Secondary hyperparathyroidism of renal origin: Secondary | ICD-10-CM | POA: Diagnosis not present

## 2019-10-23 DIAGNOSIS — D631 Anemia in chronic kidney disease: Secondary | ICD-10-CM | POA: Diagnosis not present

## 2019-10-23 DIAGNOSIS — D509 Iron deficiency anemia, unspecified: Secondary | ICD-10-CM | POA: Diagnosis not present

## 2019-10-23 DIAGNOSIS — Z992 Dependence on renal dialysis: Secondary | ICD-10-CM | POA: Diagnosis not present

## 2019-10-24 ENCOUNTER — Encounter: Payer: Self-pay | Admitting: Internal Medicine

## 2019-10-24 ENCOUNTER — Other Ambulatory Visit: Payer: Self-pay

## 2019-10-24 ENCOUNTER — Ambulatory Visit (INDEPENDENT_AMBULATORY_CARE_PROVIDER_SITE_OTHER): Payer: Medicare Other | Admitting: Internal Medicine

## 2019-10-24 VITALS — BP 173/101 | HR 87 | Resp 16 | Ht 68.0 in | Wt 240.1 lb

## 2019-10-24 DIAGNOSIS — D509 Iron deficiency anemia, unspecified: Secondary | ICD-10-CM

## 2019-10-24 DIAGNOSIS — N186 End stage renal disease: Secondary | ICD-10-CM

## 2019-10-24 DIAGNOSIS — R188 Other ascites: Secondary | ICD-10-CM | POA: Diagnosis not present

## 2019-10-24 DIAGNOSIS — S82892A Other fracture of left lower leg, initial encounter for closed fracture: Secondary | ICD-10-CM

## 2019-10-24 DIAGNOSIS — I10 Essential (primary) hypertension: Secondary | ICD-10-CM

## 2019-10-24 MED ORDER — FERROUS SULFATE 325 (65 FE) MG PO TABS: 325.0000 mg | ORAL_TABLET | Freq: Two times a day (BID) | ORAL | Status: DC

## 2019-10-24 MED ORDER — AMLODIPINE BESYLATE 10 MG PO TABS: 10.0000 mg | ORAL_TABLET | Freq: Every day | ORAL | 3 refills | Status: DC

## 2019-10-24 MED ORDER — LOSARTAN POTASSIUM 100 MG PO TABS: 100.0000 mg | ORAL_TABLET | Freq: Every day | ORAL | 3 refills | Status: DC

## 2019-10-24 NOTE — Progress Notes (Signed)
Established Patient Office Visit  Subjective:  Patient ID: Janet Mitchell, female    DOB: 19-Jul-1965  Age: 54 y.o. MRN: [id number]  CC:  Chief Complaint  Patient presents with  . Hypertension    follow up    HPI Janet Mitchell is a very pleasant 54 year old female with past medical history of end-stage renal disease-on peritoneal dialysis since 2017 transition to  hemodialysis (MWF) due to peritoneal dialysis catheter related peritonitis, hypertension, hyperlipidemia, insulin-dependent type 2 diabetes mellitus, GERD, bilateral leg swelling, morbid obesity, anemia of chronic disease, arthritis presents in our clinic for follow-up on her chronic medical issues and medication refills.  Patient tells me that she has been feeling tired lately.  She is compliant with her dialysis appointments on Monday/Wednesday/Fridays.  Reports that her blood pressure runs high at home as she ran out of her losartan since 2 weeks and could not tolerate hydralazine due to diarrhea.  She continues to take labetalol at home.  She denies headache, blurry vision, chest pain, shortness of breath, palpitation, lightheadedness, dizziness, syncope.  She denies melena, hematemesis, over-the-counter NSAID use, she is not on anticoagulation.  She fell in June 2021 and sustained left ankle fracture.  She tells me that she is doing much better.  She had an follow-up appointment with orthopedic surgery on Monday.  She underwent fistula placement on 07/22/2019 by Dr. Donnetta Hutching.  She was seen by general surgeon Dr.Teppara at [facility] on 10/01/2019 for possible laparoscopic suction of hematoma/lyse adhesions and removal of catheter followed by replacement of new catheter however she is on high risk to develop DVT/PE due to recent left ankle fracture she was advised to follow-up in 6 to 8 weeks once she started ambulating to avoid risk of DVT/PE.  Patient tells me that she will call her general surgery and make an appointment as  soon as possible.  She has chronic bilateral lower extremity edema however she denies shortness of breath, orthopnea or PND or chest pain or palpitation.   Past Medical History:  Diagnosis Date  . Anemia   . Arthritis   . CHF (congestive heart failure) ([place])    pt states she has been cleared of heart failure/disease  . Chronic cholecystitis with calculus   . COVID-19 virus infection 03/2019  . ESRD (end stage renal disease) on dialysis [facility])    Dialysis M-W-F  . Headache    "a few/wk" (03/09/2018) - no longer having these  . History of blood transfusion 10/2017   "low blood count" (03/09/2018)  . Hypertension   . Seasonal allergies   . Spinal headache   . Type II diabetes mellitus ([place])     Past Surgical History:  Procedure Laterality Date  . AMPUTATION TOE Left 2013   Great toe  . AV FISTULA PLACEMENT Left 07/22/2019   Procedure: LEFT ARM BASILIC ARTERIOVENOUS (AV) FISTULA CREATION;  Surgeon: Rosetta Posner, MD;  Location: [place];  Service: Vascular;  Laterality: Left;  . BASCILIC VEIN TRANSPOSITION Left 10/17/2019   Procedure: LEFT SECOND STAGE BASCILIC VEIN TRANSPOSITION;  Surgeon: Rosetta Posner, MD;  Location: [place];  Service: Vascular;  Laterality: Left;  . BIOPSY  10/24/2018   Procedure: BIOPSY;  Surgeon: Daneil Dolin, MD;  [facility];  Service: Endoscopy;;  right and left colon  . CATARACT EXTRACTION W/ INTRAOCULAR LENS IMPLANT Right   . CESAREAN SECTION  1994; 1998  . CHOLECYSTECTOMY N/A 03/09/2018   Procedure: LAPAROSCOPIC CHOLECYSTECTOMY WITH INTRAOPERATIVE CHOLANGIOGRAM ERAS  PATHWAY;  Surgeon: Donnie Mesa, MD;  Location: [employer];  Service: General;  Laterality: N/A;  . COLONOSCOPY N/A 10/24/2018   Procedure: COLONOSCOPY;  Surgeon: Daneil Dolin, MD;  [facility];  Service: Endoscopy;  Laterality: N/A;  1:00pm  . EYE SURGERY Left 05/15/2018   Removal of blood in the globe (due to DM)  . FLEXIBLE SIGMOIDOSCOPY N/A 11/24/2017   Procedure:  FLEXIBLE SIGMOIDOSCOPY;  Surgeon: Daneil Dolin, MD;  [facility];  Service: Endoscopy;  Laterality: N/A;  . IR FLUORO GUIDE CV LINE RIGHT  07/17/2019  . IR PARACENTESIS  07/09/2019  . IR US GUIDE VASC ACCESS RIGHT  07/17/2019  . LAPAROSCOPIC CHOLECYSTECTOMY  03/09/2018  . PERITONEAL CATHETER INSERTION  2017  . POLYPECTOMY  10/24/2018   Procedure: POLYPECTOMY;  Surgeon: Daneil Dolin, MD;  [facility];  Service: Endoscopy;;  . TOTAL HIP ARTHROPLASTY Left 1997    Family History  Problem Relation Age of Onset  . Heart disease Mother   . Thrombocytopenia Mother        TTP  . Heart failure Father   . Kidney disease Paternal Grandfather   . Colon cancer Neg Hx     Social History   Socioeconomic History  . Marital status: Married    Spouse name: Not on file  . Number of children: 2  . Years of education: Not on file  . Highest education level: Not on file  Occupational History  . Not on file  Tobacco Use  . Smoking status: Passive Smoke Exposure - Never Smoker  . Smokeless tobacco: Never Used  Vaping Use  . Vaping Use: Never used  Substance and Sexual Activity  . Alcohol use: Never  . Drug use: Never  . Sexual activity: Yes    Birth control/protection: I.U.D.    Comment: Mirena IUD  Other Topics Concern  . Not on file  Social History Narrative  . Not on file   Social Determinants of Health   Financial Resource Strain:   . Difficulty of Paying Living Expenses:   Food Insecurity:   . Worried About Charity fundraiser in the Last Year:   . Arboriculturist in the Last Year:   Transportation Needs:   . Film/video editor (Medical):   Marland Kitchen Lack of Transportation (Non-Medical):   Physical Activity:   . Days of Exercise per Week:   . Minutes of Exercise per Session:   Stress:   . Feeling of Stress :   Social Connections:   . Frequency of Communication with Friends and Family:   . Frequency of Social Gatherings with Friends and Family:   .  Attends Religious Services:   . Active Member of Clubs or Organizations:   . Attends Archivist Meetings:   Marland Kitchen Marital Status:   Intimate Partner Violence:   . Fear of Current or Ex-Partner:   . Emotionally Abused:   Marland Kitchen Physically Abused:   . Sexually Abused:     Outpatient Medications Prior to Visit  Medication Sig Dispense Refill  . acetaminophen (TYLENOL) 325 MG tablet Take 650 mg by mouth every 6 (six) hours as needed (pain).     Marland Kitchen aspirin EC 81 MG tablet Take 1 tablet (81 mg total) by mouth daily.    . Blood Glucose Monitoring Suppl (ACCU-CHEK GUIDE) w/Device KIT 1 Piece by Does not apply route as directed. 1 kit 0  . calcitRIOL (ROCALTROL) 0.5 MCG capsule Take 1-2  capsules (0.5-1 mcg total) by mouth See admin instructions. 0.51mg on Mondays through Fridays and take 117m on Saturdays and Sundays (Patient taking differently: Take 0.5-1 mcg by mouth See admin instructions. Take 0.9m62mon Mondays through Fridays and take 1mc43mn Saturdays and Sundays) 90 capsule 1  . cetirizine (ZYRTEC) 10 MG tablet Take 1 tablet (10 mg total) by mouth daily. 30 tablet 1  . glucose blood (ACCU-CHEK GUIDE) test strip Use as instructed 150 each 2  . HYDROcodone-acetaminophen (NORCO/VICODIN) 5-325 MG tablet Take 1 tablet by mouth every 6 (six) hours as needed for moderate pain. 20 tablet 0  . insulin glargine, 2 Unit Dial, (TOUJEO MAX SOLOSTAR) 300 UNIT/ML Solostar Pen Inject 10 Units into the skin daily. (Patient taking differently: Inject 10 Units into the skin at bedtime. )    . labetalol (NORMODYNE) 300 MG tablet Take 300 mg by mouth 2 (two) times daily.     . multivitamin (RENA-VIT) TABS tablet Take 1 tablet by mouth at bedtime. 30 tablet 0  . hydrALAZINE (APRESOLINE) 25 MG tablet Take 1 tablet (25 mg total) by mouth 3 (three) times daily. 90 tablet 3  . losartan (COZAAR) 100 MG tablet Take 100 mg by mouth daily.      No facility-administered medications prior to visit.    No Known  Allergies  ROS Review of Systems  Constitutional: Negative.   HENT: Negative.   Eyes: Negative.   Respiratory: Negative.   Cardiovascular: Positive for leg swelling.  Gastrointestinal: Positive for abdominal distention.  Endocrine: Negative.   Genitourinary: Negative.   Musculoskeletal: Negative.   Allergic/Immunologic: Negative.   Neurological: Negative.   Hematological: Negative.   Psychiatric/Behavioral: Negative.       Objective:    Physical Exam Constitutional:      Appearance: She is obese.  HENT:     Head: Normocephalic and atraumatic.     Nose: Nose normal.     Mouth/Throat:     Mouth: Mucous membranes are moist.  Eyes:     Extraocular Movements: Extraocular movements intact.     Conjunctiva/sclera: Conjunctivae normal.     Pupils: Pupils are equal, round, and reactive to light.  Cardiovascular:     Rate and Rhythm: Normal rate and regular rhythm.     Pulses: Normal pulses.     Heart sounds: Normal heart sounds.  Pulmonary:     Effort: Pulmonary effort is normal.     Breath sounds: Normal breath sounds.  Abdominal:     General: Bowel sounds are normal.     Palpations: Abdomen is soft.     Comments: Peritoneal dialysis catheter: Intact.  Periumbilical swelling noted.  Mildly tender on palpation.  No guarding, no rigidity, bowel sounds positive.  Musculoskeletal:        General: Swelling present.     Comments: Bilateral 2+ pitting edema positive  Neurological:     General: No focal deficit present.     Mental Status: She is alert and oriented to person, place, and time.  Psychiatric:        Mood and Affect: Mood normal.        Behavior: Behavior normal.        Thought Content: Thought content normal.        Judgment: Judgment normal.     BP (!) 173/101   Pulse 87   Resp 16   Ht '5\' 8"'  (1.727 m)   Wt (!) 240 lb 1.3 oz (108.9 kg)   LMP  (LMP Unknown)  Comment: Mirend IUD placed 2020 per patient  SpO2 94%   BMI 36.50 kg/m  Wt Readings from Last 3  Encounters:  10/24/19 (!) 240 lb 1.3 oz (108.9 kg)  10/17/19 240 lb (108.9 kg)  09/20/19 240 lb (108.9 kg)     Health Maintenance Due  Topic Date Due  . Hepatitis C Screening  Never done  . PNEUMOCOCCAL POLYSACCHARIDE VACCINE AGE 60-64 HIGH RISK  Never done  . FOOT EXAM  Never done  . TETANUS/TDAP  Never done  . OPHTHALMOLOGY EXAM  04/09/2016  . MAMMOGRAM  Never done    There are no preventive care reminders to display for this patient.  No results found for: TSH Lab Results  Component Value Date   WBC 4.8 10/17/2019   HGB 11.2 (L) 10/17/2019   HCT 33.0 (L) 10/17/2019   MCV 77.0 (L) 10/17/2019   PLT 167 10/17/2019   Lab Results  Component Value Date   NA 138 10/17/2019   K 4.7 10/17/2019   CO2 30 08/29/2019   GLUCOSE 221 (H) 10/17/2019   BUN 42 (H) 10/17/2019   CREATININE 4.70 (H) 10/17/2019   BILITOT 0.5 08/29/2019   ALKPHOS 63 07/30/2019   AST 16 08/29/2019   ALT 15 08/29/2019   PROT 8.1 08/29/2019   ALBUMIN 2.4 (L) 07/30/2019   CALCIUM 9.2 08/29/2019   ANIONGAP 11 07/30/2019   Lab Results  Component Value Date   CHOL 114 08/29/2019   Lab Results  Component Value Date   HDL 35 (L) 08/29/2019   Lab Results  Component Value Date   LDLCALC 62 08/29/2019   Lab Results  Component Value Date   TRIG 91 08/29/2019   Lab Results  Component Value Date   CHOLHDL 3.3 08/29/2019   Lab Results  Component Value Date   HGBA1C 7.2 (H) 07/08/2019      Assessment & Plan:   Problem List Items Addressed This Visit      Cardiovascular and Mediastinum   Essential hypertension - Primary   Relevant Medications   losartan (COZAAR) 100 MG tablet   amLODipine (NORVASC) 10 MG tablet     Genitourinary   ESRD (end stage renal disease) (HCC)     Other   Microcytic anemia   Relevant Medications   ferrous sulfate tablet 325 mg (Start on 10/24/2019  5:00 PM)    Other Visit Diagnoses    Abdominal wall fluid collections       Closed fracture of left ankle,  initial encounter         Hypertension: Blood pressure 173/101 upon arrival.  Repeat manually was 160/90 -Secondary to noncompliance with medication.  She ran out of losartan for 2 weeks.  Could not tolerate hydralazine due to diarrhea. -Continue labetalol.  Start patient on amlodipine 10 mg once daily.  Refills given on losartan.  Advised to check blood pressure every day and bring log on next visit. -Advised DASH diet, weight loss, dietary modification. -Follow-up in 8 weeks.  Anemia of chronic disease/microcytic anemia: -Reviewed CBC from 10/17/2019 which showed H&H of 8.5/29.4.  MCV: 77.0 -Likely reason of patient's tiredness -Start patient on ferrous sulfate 325 mg twice daily  ESRD: Failed peritoneal dialysis.  Currently on hemodialysis Monday Wednesday and Friday.  Abdominal wall fluid collections: -She was seen by general surgery at [facility] Dr. Janeice Robinson 10/01/2019 for possible laparoscopic suction hematoma/light sedation and removal of catheter followed by replacement of new catheter. -Patient was advised to follow-up in 6 to 8 weeks  due to recent left ankle fracture to avoid risk for DVT/PE.  Left ankle fracture: Patient is improving. -She had an appointment with her orthopedic surgery on this Monday.  Meds ordered this encounter  Medications  . losartan (COZAAR) 100 MG tablet    Sig: Take 1 tablet (100 mg total) by mouth daily.    Dispense:  90 tablet    Refill:  3  . amLODipine (NORVASC) 10 MG tablet    Sig: Take 1 tablet (10 mg total) by mouth daily.    Dispense:  90 tablet    Refill:  3  . ferrous sulfate tablet 325 mg    Follow-up: No follow-ups on file.    Mckinley Jewel, MD

## 2019-10-24 NOTE — Patient Instructions (Signed)
Follow up in 8 weeks Stop hydralazine, start amlodipine' Iron supplement BID Check BP everyday & bring log on next visit.

## 2019-10-25 DIAGNOSIS — N186 End stage renal disease: Secondary | ICD-10-CM | POA: Diagnosis not present

## 2019-10-25 DIAGNOSIS — D631 Anemia in chronic kidney disease: Secondary | ICD-10-CM | POA: Diagnosis not present

## 2019-10-25 DIAGNOSIS — D509 Iron deficiency anemia, unspecified: Secondary | ICD-10-CM | POA: Diagnosis not present

## 2019-10-25 DIAGNOSIS — E8779 Other fluid overload: Secondary | ICD-10-CM | POA: Diagnosis not present

## 2019-10-25 DIAGNOSIS — N2581 Secondary hyperparathyroidism of renal origin: Secondary | ICD-10-CM | POA: Diagnosis not present

## 2019-10-25 DIAGNOSIS — Z992 Dependence on renal dialysis: Secondary | ICD-10-CM | POA: Diagnosis not present

## 2019-10-26 DIAGNOSIS — Z992 Dependence on renal dialysis: Secondary | ICD-10-CM | POA: Diagnosis not present

## 2019-10-26 DIAGNOSIS — N186 End stage renal disease: Secondary | ICD-10-CM | POA: Diagnosis not present

## 2019-10-28 DIAGNOSIS — Z992 Dependence on renal dialysis: Secondary | ICD-10-CM | POA: Diagnosis not present

## 2019-10-28 DIAGNOSIS — N2581 Secondary hyperparathyroidism of renal origin: Secondary | ICD-10-CM | POA: Diagnosis not present

## 2019-10-28 DIAGNOSIS — N186 End stage renal disease: Secondary | ICD-10-CM | POA: Diagnosis not present

## 2019-10-28 DIAGNOSIS — D631 Anemia in chronic kidney disease: Secondary | ICD-10-CM | POA: Diagnosis not present

## 2019-10-28 DIAGNOSIS — D509 Iron deficiency anemia, unspecified: Secondary | ICD-10-CM | POA: Diagnosis not present

## 2019-10-29 ENCOUNTER — Ambulatory Visit: Payer: Medicare Other | Admitting: Nurse Practitioner

## 2019-10-30 DIAGNOSIS — D509 Iron deficiency anemia, unspecified: Secondary | ICD-10-CM | POA: Diagnosis not present

## 2019-10-30 DIAGNOSIS — Z992 Dependence on renal dialysis: Secondary | ICD-10-CM | POA: Diagnosis not present

## 2019-10-30 DIAGNOSIS — N2581 Secondary hyperparathyroidism of renal origin: Secondary | ICD-10-CM | POA: Diagnosis not present

## 2019-10-30 DIAGNOSIS — N186 End stage renal disease: Secondary | ICD-10-CM | POA: Diagnosis not present

## 2019-10-30 DIAGNOSIS — D631 Anemia in chronic kidney disease: Secondary | ICD-10-CM | POA: Diagnosis not present

## 2019-11-01 DIAGNOSIS — N2581 Secondary hyperparathyroidism of renal origin: Secondary | ICD-10-CM | POA: Diagnosis not present

## 2019-11-01 DIAGNOSIS — D509 Iron deficiency anemia, unspecified: Secondary | ICD-10-CM | POA: Diagnosis not present

## 2019-11-01 DIAGNOSIS — D631 Anemia in chronic kidney disease: Secondary | ICD-10-CM | POA: Diagnosis not present

## 2019-11-01 DIAGNOSIS — Z992 Dependence on renal dialysis: Secondary | ICD-10-CM | POA: Diagnosis not present

## 2019-11-01 DIAGNOSIS — N186 End stage renal disease: Secondary | ICD-10-CM | POA: Diagnosis not present

## 2019-11-04 DIAGNOSIS — N2581 Secondary hyperparathyroidism of renal origin: Secondary | ICD-10-CM | POA: Diagnosis not present

## 2019-11-04 DIAGNOSIS — N186 End stage renal disease: Secondary | ICD-10-CM | POA: Diagnosis not present

## 2019-11-04 DIAGNOSIS — D631 Anemia in chronic kidney disease: Secondary | ICD-10-CM | POA: Diagnosis not present

## 2019-11-04 DIAGNOSIS — Z992 Dependence on renal dialysis: Secondary | ICD-10-CM | POA: Diagnosis not present

## 2019-11-04 DIAGNOSIS — D509 Iron deficiency anemia, unspecified: Secondary | ICD-10-CM | POA: Diagnosis not present

## 2019-11-06 DIAGNOSIS — N2581 Secondary hyperparathyroidism of renal origin: Secondary | ICD-10-CM | POA: Diagnosis not present

## 2019-11-06 DIAGNOSIS — Z992 Dependence on renal dialysis: Secondary | ICD-10-CM | POA: Diagnosis not present

## 2019-11-06 DIAGNOSIS — N186 End stage renal disease: Secondary | ICD-10-CM | POA: Diagnosis not present

## 2019-11-06 DIAGNOSIS — D631 Anemia in chronic kidney disease: Secondary | ICD-10-CM | POA: Diagnosis not present

## 2019-11-06 DIAGNOSIS — D509 Iron deficiency anemia, unspecified: Secondary | ICD-10-CM | POA: Diagnosis not present

## 2019-11-08 DIAGNOSIS — N186 End stage renal disease: Secondary | ICD-10-CM | POA: Diagnosis not present

## 2019-11-08 DIAGNOSIS — N2581 Secondary hyperparathyroidism of renal origin: Secondary | ICD-10-CM | POA: Diagnosis not present

## 2019-11-08 DIAGNOSIS — D631 Anemia in chronic kidney disease: Secondary | ICD-10-CM | POA: Diagnosis not present

## 2019-11-08 DIAGNOSIS — D509 Iron deficiency anemia, unspecified: Secondary | ICD-10-CM | POA: Diagnosis not present

## 2019-11-08 DIAGNOSIS — Z992 Dependence on renal dialysis: Secondary | ICD-10-CM | POA: Diagnosis not present

## 2019-11-11 DIAGNOSIS — N186 End stage renal disease: Secondary | ICD-10-CM | POA: Diagnosis not present

## 2019-11-11 DIAGNOSIS — D631 Anemia in chronic kidney disease: Secondary | ICD-10-CM | POA: Diagnosis not present

## 2019-11-11 DIAGNOSIS — N2581 Secondary hyperparathyroidism of renal origin: Secondary | ICD-10-CM | POA: Diagnosis not present

## 2019-11-11 DIAGNOSIS — Z992 Dependence on renal dialysis: Secondary | ICD-10-CM | POA: Diagnosis not present

## 2019-11-11 DIAGNOSIS — D509 Iron deficiency anemia, unspecified: Secondary | ICD-10-CM | POA: Diagnosis not present

## 2019-11-13 DIAGNOSIS — D631 Anemia in chronic kidney disease: Secondary | ICD-10-CM | POA: Diagnosis not present

## 2019-11-13 DIAGNOSIS — N186 End stage renal disease: Secondary | ICD-10-CM | POA: Diagnosis not present

## 2019-11-13 DIAGNOSIS — N2581 Secondary hyperparathyroidism of renal origin: Secondary | ICD-10-CM | POA: Diagnosis not present

## 2019-11-13 DIAGNOSIS — Z992 Dependence on renal dialysis: Secondary | ICD-10-CM | POA: Diagnosis not present

## 2019-11-13 DIAGNOSIS — D509 Iron deficiency anemia, unspecified: Secondary | ICD-10-CM | POA: Diagnosis not present

## 2019-11-14 ENCOUNTER — Other Ambulatory Visit: Payer: Self-pay

## 2019-11-14 ENCOUNTER — Ambulatory Visit (INDEPENDENT_AMBULATORY_CARE_PROVIDER_SITE_OTHER): Payer: Medicare Other | Admitting: Physician Assistant

## 2019-11-14 VITALS — BP 178/90 | HR 80 | Temp 98.7°F | Resp 20 | Ht 68.0 in | Wt 239.4 lb

## 2019-11-14 DIAGNOSIS — N186 End stage renal disease: Secondary | ICD-10-CM

## 2019-11-14 DIAGNOSIS — G8918 Other acute postprocedural pain: Secondary | ICD-10-CM

## 2019-11-14 NOTE — Progress Notes (Signed)
POST OPERATIVE DIALYSIS ACCESS OFFICE NOTE    CC:  F/u for dialysis access surgery  HPI:  This is a 54 y.o. female who is s/p left brachiocephalic AVF on 0/03/7492.  She then underwent transposition on 10/17/2019.  She noticed a swelling of her medial upper arm after surgery which is progressed.  She complains of a burning pain in this area that extends along the ulnar aspect of her forearm.  She denies hand pain or numbness.  She recently fell forward onto the sidewalk while leaving church.  She scraped both palms, her left knee and the left side of her face.  These areas are healing.  She is dialyzing via right IJ tunneled dialysis catheter without complications.  She denies fever or chills  Dialysis days:  MWF   Dialysis center:  [facility]  No Known Allergies  Current Outpatient Medications  Medication Sig Dispense Refill  . acetaminophen (TYLENOL) 325 MG tablet Take 650 mg by mouth every 6 (six) hours as needed (pain).     Marland Kitchen amLODipine (NORVASC) 10 MG tablet Take 1 tablet (10 mg total) by mouth daily. 90 tablet 3  . aspirin EC 81 MG tablet Take 1 tablet (81 mg total) by mouth daily.    . Blood Glucose Monitoring Suppl (ACCU-CHEK GUIDE) w/Device KIT 1 Piece by Does not apply route as directed. 1 kit 0  . calcitRIOL (ROCALTROL) 0.5 MCG capsule Take 1-2 capsules (0.5-1 mcg total) by mouth See admin instructions. 0.36mg on Mondays through Fridays and take 156m on Saturdays and Sundays (Patient taking differently: Take 0.5-1 mcg by mouth See admin instructions. Take 0.81m37mon Mondays through Fridays and take 1mc30mn Saturdays and Sundays) 90 capsule 1  . cetirizine (ZYRTEC) 10 MG tablet Take 1 tablet (10 mg total) by mouth daily. 30 tablet 1  . glucose blood (ACCU-CHEK GUIDE) test strip Use as instructed 150 each 2  . HYDROcodone-acetaminophen (NORCO/VICODIN) 5-325 MG tablet Take 1 tablet by mouth every 6 (six) hours as needed for moderate pain. 20 tablet 0  . insulin glargine, 2 Unit  Dial, (TOUJEO MAX SOLOSTAR) 300 UNIT/ML Solostar Pen Inject 10 Units into the skin daily. (Patient taking differently: Inject 10 Units into the skin at bedtime. )    . labetalol (NORMODYNE) 300 MG tablet Take 300 mg by mouth 2 (two) times daily.     . loMarland Kitchenartan (COZAAR) 100 MG tablet Take 1 tablet (100 mg total) by mouth daily. 90 tablet 3  . multivitamin (RENA-VIT) TABS tablet Take 1 tablet by mouth at bedtime. 30 tablet 0   Current Facility-Administered Medications  Medication Dose Route Frequency Provider Last Rate Last Admin  . ferrous sulfate tablet 325 mg  325 mg Oral BID WC Pahwani, Rinka R, MD         ROS:  See HPI  BP (!) 178/90 (BP Location: Right Arm, Patient Position: Sitting, Cuff Size: Large)   Pulse 80   Temp 98.7 F (37.1 C) (Temporal)   Resp 20   Ht _0  (1.727 m)   Wt 239 lb 6.4 oz (108.6 kg)   LMP  (LMP Unknown) Comment: Mirend IUD placed 2020 per patient  SpO2 97%   BMI 36.40 kg/m    Physical Exam:  General appearance: Well-developed well-nourished female in no apparent distress Cardiac: Heart rate and rhythm are regular Respiratory: Nonlabored Incision: Her left upper extremity incisions are all healing without signs of infection Extremities: Examination of her left upper extremity reveals an approximately 4-5 x  5 cm mass along the medial aspect of her upper arm.  This is firm to palpation and slightly tender.  There is no erythema.  There is a good bruit and thrill in her fistula and it is palpable along its course in the upper arm.  The patient's left palm is slightly pale as compared to her right.  She has a brisk triphasic radial Doppler signal and a monophasic ulnar signal.  Palmar arch signal is also brisk.  She has 5 out of 5 hand grip strength.  Sensation is intact  Assessment/Plan:   -Soft tissue mass of the left upper arm consistent with hematoma.  No signs of infection -pt does not have evidence of steal syndrome -I have advised the patient to  obtain a small soft cushion to place between her arm and chest to help relieve irritation from the hematoma.  I also told her warm compresses could help with discomfort.  I expect this to resolve over the next several weeks. -Follow-up in 3 weeks to assure resolution and continue dialysis via catheter until left upper arm is reevaluated  Barbie Banner, PA-C 11/14/2019 2:17 PM [facility] [phone]  Clinic MD: [facility]

## 2019-11-15 DIAGNOSIS — N2581 Secondary hyperparathyroidism of renal origin: Secondary | ICD-10-CM | POA: Diagnosis not present

## 2019-11-15 DIAGNOSIS — N186 End stage renal disease: Secondary | ICD-10-CM | POA: Diagnosis not present

## 2019-11-15 DIAGNOSIS — D509 Iron deficiency anemia, unspecified: Secondary | ICD-10-CM | POA: Diagnosis not present

## 2019-11-15 DIAGNOSIS — D631 Anemia in chronic kidney disease: Secondary | ICD-10-CM | POA: Diagnosis not present

## 2019-11-15 DIAGNOSIS — Z992 Dependence on renal dialysis: Secondary | ICD-10-CM | POA: Diagnosis not present

## 2019-11-18 DIAGNOSIS — N2581 Secondary hyperparathyroidism of renal origin: Secondary | ICD-10-CM | POA: Diagnosis not present

## 2019-11-18 DIAGNOSIS — N186 End stage renal disease: Secondary | ICD-10-CM | POA: Diagnosis not present

## 2019-11-18 DIAGNOSIS — D509 Iron deficiency anemia, unspecified: Secondary | ICD-10-CM | POA: Diagnosis not present

## 2019-11-18 DIAGNOSIS — D631 Anemia in chronic kidney disease: Secondary | ICD-10-CM | POA: Diagnosis not present

## 2019-11-18 DIAGNOSIS — Z992 Dependence on renal dialysis: Secondary | ICD-10-CM | POA: Diagnosis not present

## 2019-11-20 DIAGNOSIS — D509 Iron deficiency anemia, unspecified: Secondary | ICD-10-CM | POA: Diagnosis not present

## 2019-11-20 DIAGNOSIS — N186 End stage renal disease: Secondary | ICD-10-CM | POA: Diagnosis not present

## 2019-11-20 DIAGNOSIS — Z992 Dependence on renal dialysis: Secondary | ICD-10-CM | POA: Diagnosis not present

## 2019-11-20 DIAGNOSIS — D631 Anemia in chronic kidney disease: Secondary | ICD-10-CM | POA: Diagnosis not present

## 2019-11-20 DIAGNOSIS — N2581 Secondary hyperparathyroidism of renal origin: Secondary | ICD-10-CM | POA: Diagnosis not present

## 2019-11-21 ENCOUNTER — Other Ambulatory Visit: Payer: Self-pay

## 2019-11-21 ENCOUNTER — Encounter: Payer: Self-pay | Admitting: Internal Medicine

## 2019-11-21 ENCOUNTER — Ambulatory Visit: Payer: Medicare Other | Admitting: Internal Medicine

## 2019-11-21 DIAGNOSIS — R0609 Other forms of dyspnea: Secondary | ICD-10-CM

## 2019-11-21 DIAGNOSIS — R0600 Dyspnea, unspecified: Secondary | ICD-10-CM

## 2019-11-21 NOTE — Patient Instructions (Addendum)
Make sure you check your oxygen saturations at highest level of activity to be sure it stays over 90% and adjust upward to maintain this level if needed but remember to turn it back to previous settings when you stop (to conserve your supply).    Please remember to go to the  x-ray department  @  [facility]  After dialysis tomorrow as dry as they can get you  - we will call you with the results when they are available      Please schedule a follow up visit in 3 months -PFTs on return -  but call sooner if needed

## 2019-11-21 NOTE — Progress Notes (Signed)
Janet Mitchell, female    DOB: [birth year]       MRN: [id number]   Brief patient profile:  54 yobf never smoker with seasonal allergies worse spring worse since childhood cough/ sneeze rx with otc covid 19 [place] Jan 2021 sent home on 02 but never used it and f/u cxr's suggested PF so referred to pulmonary clinic in [place]  11/21/2019 by Dr    Doristine Bosworth    Admit date: 04/03/2019 Discharge date: 04/13/2019  Admitted From: Home Disposition:  Home  Recommendations for Outpatient Follow-up:  1. Follow up with PCP in 1-2 weeks 2. Please obtain BMP/CBC in one week 3. Please follow up with nephrology as scheduled 4. Please follow up with gastroenterology  Home Health: Yes Equipment/Devices: Oxygen, wheelchair, PT, OT   Discharge Condition: Stable  CODE STATUS: Full Diet recommendation: Renal   Brief/Interim Summary:  54 y.o.femalewith medical history ofanemia due to [facility] peritoneal dialysis, history of blood transfusion, CHF, chronic cholecystitiss/p cholecystectomy, ESRD on PD, history of headaches, history of hypertension, spinal headaches, type 2 diabetes mellituswho presented to the ED withnausea, vomiting and diarrheax approximately onemonth. Loose stools noted since her cholecystectomy. She was seen by GI outpatient, prescribed Levaquin, not started before this admission. Was taking Imodium at home without much effect. In the ED, afebrile, hypoxic 88% on room air, COVID-19 antigen negative but PCR POSITIVE. Hyponatremic 127. Severe hypoalbuminemia 1.9, creatinine 9.53 consistent with ESRD. Inflammatory markers were elevated.Procalcitonin 1.70. Chest xray showed diffuse bilateral airspace disease. She was treated with IV fluids, potassium, Rocephin and azithromycin. Patient admitted to hospitalist service, started on remdesivir, steroids, bronchodilators. Nephrology consulted for PD management.GI consulted for persistent abdominal pain and diarrhea. Patient  has chronic diarrhea likely bile salt associated s/p cholecystectomy. Started on cholestyramine, Levsin, rifaximin, trial of amitriptyline at bedtime, Protonix.  H pylori and celiac panels negative.  CT abdomen and pelvis without contrast during this admission revealed heavily calcified aortaand branch vessels.  Consider mesenteric ischemia as source of patient's most often post-prandial abdominal pain.  Discussed with renal and radiology and planned for CTA abdomen/pelvis to complete evaluation.  Patient's abdominal pain had improved, tolerating some oral intake.  She requested discharge home and close outpatient follow up and CTA. Patient qualified for oxygen on discharge, this was arranged in addition to home health PT and OT.     Discharge Diagnoses: Principal Problem:   ESRD on peritoneal dialysis [facility]) Active Problems:   Essential hypertension   Type 2 diabetes mellitus with ESRD (end-stage renal disease) (HCC)   Chronic diarrhea   Black stool   Uremia, acute   Weakness   Vertigo   Acute blood loss anemia   Hyperkalemia   Hypotension      History of Present Illness  11/21/2019  Pulmonary/ 1st office eval/ Melvyn Novas / [facility]  ? ILD Chief Complaint  Patient presents with  . Pulmonary Consult    Referred by Dr Early Osmond for eval of ILD. She c/o SOB since she had covid Jan 2021- gets winded walking approx 50 ft.   Dyspnea: since covid 50 ft doe / does not check sats Cough: non prod  p lie down for about an hour every other night but does not keep her up  Sleep: bed is flat/ 3 pillows  SABA use: not using  02 has it not using   No obvious day to day or daytime variability or assoc excess/ purulent sputum or mucus plugs or hemoptysis or cp or chest tightness,  subjective wheeze or overt sinus or hb symptoms.     Also denies any obvious fluctuation of symptoms with weather or environmental changes or other aggravating or alleviating factors except as outlined above    No unusual exposure hx or h/o childhood pna/ asthma or knowledge of premature birth.  Current Allergies, Complete Past Medical History, Past Surgical History, Family History, and Social History were reviewed in [employer] record.  ROS  The following are not active complaints unless bolded Hoarseness, sore throat, dysphagia, dental problems, itching, sneezing,  nasal congestion or discharge of excess mucus or purulent secretions, ear ache,   fever, chills, sweats, unintended wt loss or wt gain, classically pleuritic or exertional cp,  orthopnea pnd or arm/hand swelling  or leg swelling, presyncope, palpitations, abdominal pain, anorexia, nausea, vomiting, diarrhea  or change in bowel habits or change in bladder habits, change in stools or change in urine, dysuria, hematuria,  rash, arthralgias, visual complaints, headache, numbness, weakness or ataxia or problems with walking or coordination,  change in mood or  memory.           Past Medical History:  Diagnosis Date  . Anemia   . Arthritis   . CHF (congestive heart failure) ([place])    pt states she has been cleared of heart failure/disease  . Chronic cholecystitis with calculus   . COVID-19 virus infection 03/2019  . ESRD (end stage renal disease) on dialysis [facility])    Dialysis M-W-F  . Headache    "a few/wk" (03/09/2018) - no longer having these  . History of blood transfusion 10/2017   "low blood count" (03/09/2018)  . Hypertension   . Seasonal allergies   . Spinal headache   . Type II diabetes mellitus ([place])     Outpatient Medications Prior to Visit  Medication Sig Dispense Refill  . acetaminophen (TYLENOL) 325 MG tablet Take 650 mg by mouth every 6 (six) hours as needed (pain).     Marland Kitchen amLODipine (NORVASC) 10 MG tablet Take 1 tablet (10 mg total) by mouth daily. 90 tablet 3  . aspirin EC 81 MG tablet Take 1 tablet (81 mg total) by mouth daily.    . Blood Glucose Monitoring Suppl (ACCU-CHEK GUIDE) w/Device  KIT 1 Piece by Does not apply route as directed. 1 kit 0  . calcitRIOL (ROCALTROL) 0.5 MCG capsule Take 1-2 capsules (0.5-1 mcg total) by mouth See admin instructions. 0.28mg on Mondays through Fridays and take 158m on Saturdays and Sundays (Patient taking differently: Take 0.5-1 mcg by mouth See admin instructions. Take 0.74m68mon Mondays through Fridays and take 1mc62mn Saturdays and Sundays) 90 capsule 1  . cetirizine (ZYRTEC) 10 MG tablet Take 1 tablet (10 mg total) by mouth daily. 30 tablet 1  . glucose blood (ACCU-CHEK GUIDE) test strip Use as instructed 150 each 2  . HYDROcodone-acetaminophen (NORCO/VICODIN) 5-325 MG tablet Take 1 tablet by mouth every 6 (six) hours as needed for moderate pain. 20 tablet 0  . insulin glargine, 2 Unit Dial, (TOUJEO MAX SOLOSTAR) 300 UNIT/ML Solostar Pen Inject 10 Units into the skin daily. (Patient taking differently: Inject 10 Units into the skin at bedtime. )    . labetalol (NORMODYNE) 300 MG tablet Take 300 mg by mouth 2 (two) times daily.     . loMarland Kitchenartan (COZAAR) 100 MG tablet Take 1 tablet (100 mg total) by mouth daily. 90 tablet 3  . multivitamin (RENA-VIT) TABS tablet Take 1 tablet by mouth at bedtime. 30 tablet  0   Facility-Administered Medications Prior to Visit  Medication Dose Route Frequency Provider Last Rate Last Admin  . ferrous sulfate tablet 325 mg  325 mg Oral BID WC Pahwani, Rinka R, MD         Objective:     BP (!) 144/90 (BP Location: Left Arm, Cuff Size: Normal)   Pulse 72   Temp 98.5 F (36.9 C)   Ht _0  (1.727 m)   Wt 238 lb (108 kg)   SpO2 99% Comment: on RA  BMI 36.19 kg/m   SpO2: 99 % (on RA)  Nl x can't stand/ 1-2 + pitting   Obese bf who can't stand from a chair without assistance due to leg weakness   HEENT : pt wearing mask not removed for exam due to covid -19 concerns.    NECK :  without JVD/Nodes/TM/ nl carotid upstrokes bilaterally   LUNGS: no acc muscle use,  Nl contour chest which is clear to A and P  bilaterally without cough on insp or exp maneuvers   CV:  RRR  no s3 or murmur or increase in P2, and no edema   ABD:  soft and nontender with nl inspiratory excursion in the supine position. No bruits or organomegaly appreciated, bowel sounds nl  MS:  Awkard/ slow  ext warm without deformities, calf tenderness, cyanosis or clubbing No obvious joint restrictions   SKIN: warm and dry without lesions    NEURO:  alert, approp, nl sensorium unable to stand from chair due to weakness    CXR PA and Lateral:  11/22/2019 to be done p HD   I personally reviewed images and agree with radiology impression as follows:       I personally reviewed images and agree with radiology impression as follows:   Chest CT cuts on abd ct 07/09/19 : Lower chest: Lung bases demonstrate heterogeneous mostly peripheral opacities likely scarring from prior COVID pneumonia. No consolidation or pleural effusion. Coronary vascular calcification. Mild cardiomegaly.     Assessment   No problem-specific Assessment & Plan notes found for this encounter.     Christinia Gully, MD 11/21/2019

## 2019-11-22 DIAGNOSIS — Z992 Dependence on renal dialysis: Secondary | ICD-10-CM | POA: Diagnosis not present

## 2019-11-22 DIAGNOSIS — D631 Anemia in chronic kidney disease: Secondary | ICD-10-CM | POA: Diagnosis not present

## 2019-11-22 DIAGNOSIS — D509 Iron deficiency anemia, unspecified: Secondary | ICD-10-CM | POA: Diagnosis not present

## 2019-11-22 DIAGNOSIS — N186 End stage renal disease: Secondary | ICD-10-CM | POA: Diagnosis not present

## 2019-11-22 DIAGNOSIS — N2581 Secondary hyperparathyroidism of renal origin: Secondary | ICD-10-CM | POA: Diagnosis not present

## 2019-11-22 NOTE — Assessment & Plan Note (Signed)
Onset Jan 2021 with covid assoc with legs weak/ numb  - 11/21/2019 pt walked slow pace approx 50 ft/leg bilateral  pain//tired weak >>> no SOB with sats still 96%  - CXR 11/22/2019 done p HD:

## 2019-11-25 DIAGNOSIS — N2581 Secondary hyperparathyroidism of renal origin: Secondary | ICD-10-CM | POA: Diagnosis not present

## 2019-11-25 DIAGNOSIS — D631 Anemia in chronic kidney disease: Secondary | ICD-10-CM | POA: Diagnosis not present

## 2019-11-25 DIAGNOSIS — D509 Iron deficiency anemia, unspecified: Secondary | ICD-10-CM | POA: Diagnosis not present

## 2019-11-25 DIAGNOSIS — Z992 Dependence on renal dialysis: Secondary | ICD-10-CM | POA: Diagnosis not present

## 2019-11-25 DIAGNOSIS — N186 End stage renal disease: Secondary | ICD-10-CM | POA: Diagnosis not present

## 2019-11-26 DIAGNOSIS — Z992 Dependence on renal dialysis: Secondary | ICD-10-CM | POA: Diagnosis not present

## 2019-11-26 DIAGNOSIS — N186 End stage renal disease: Secondary | ICD-10-CM | POA: Diagnosis not present

## 2019-11-27 DIAGNOSIS — N186 End stage renal disease: Secondary | ICD-10-CM | POA: Diagnosis not present

## 2019-11-27 DIAGNOSIS — Z992 Dependence on renal dialysis: Secondary | ICD-10-CM | POA: Diagnosis not present

## 2019-11-27 DIAGNOSIS — D509 Iron deficiency anemia, unspecified: Secondary | ICD-10-CM | POA: Diagnosis not present

## 2019-11-27 DIAGNOSIS — M25572 Pain in left ankle and joints of left foot: Secondary | ICD-10-CM | POA: Diagnosis not present

## 2019-11-27 DIAGNOSIS — D631 Anemia in chronic kidney disease: Secondary | ICD-10-CM | POA: Diagnosis not present

## 2019-11-27 DIAGNOSIS — N2581 Secondary hyperparathyroidism of renal origin: Secondary | ICD-10-CM | POA: Diagnosis not present

## 2019-11-27 DIAGNOSIS — S8252XA Displaced fracture of medial malleolus of left tibia, initial encounter for closed fracture: Secondary | ICD-10-CM | POA: Diagnosis not present

## 2019-11-29 DIAGNOSIS — D509 Iron deficiency anemia, unspecified: Secondary | ICD-10-CM | POA: Diagnosis not present

## 2019-11-29 DIAGNOSIS — N2581 Secondary hyperparathyroidism of renal origin: Secondary | ICD-10-CM | POA: Diagnosis not present

## 2019-11-29 DIAGNOSIS — D631 Anemia in chronic kidney disease: Secondary | ICD-10-CM | POA: Diagnosis not present

## 2019-11-29 DIAGNOSIS — Z992 Dependence on renal dialysis: Secondary | ICD-10-CM | POA: Diagnosis not present

## 2019-11-29 DIAGNOSIS — N186 End stage renal disease: Secondary | ICD-10-CM | POA: Diagnosis not present

## 2019-12-02 DIAGNOSIS — N2581 Secondary hyperparathyroidism of renal origin: Secondary | ICD-10-CM | POA: Diagnosis not present

## 2019-12-02 DIAGNOSIS — Z992 Dependence on renal dialysis: Secondary | ICD-10-CM | POA: Diagnosis not present

## 2019-12-02 DIAGNOSIS — N186 End stage renal disease: Secondary | ICD-10-CM | POA: Diagnosis not present

## 2019-12-02 DIAGNOSIS — D509 Iron deficiency anemia, unspecified: Secondary | ICD-10-CM | POA: Diagnosis not present

## 2019-12-02 DIAGNOSIS — D631 Anemia in chronic kidney disease: Secondary | ICD-10-CM | POA: Diagnosis not present

## 2019-12-04 DIAGNOSIS — Z992 Dependence on renal dialysis: Secondary | ICD-10-CM | POA: Diagnosis not present

## 2019-12-04 DIAGNOSIS — D631 Anemia in chronic kidney disease: Secondary | ICD-10-CM | POA: Diagnosis not present

## 2019-12-04 DIAGNOSIS — N186 End stage renal disease: Secondary | ICD-10-CM | POA: Diagnosis not present

## 2019-12-04 DIAGNOSIS — N2581 Secondary hyperparathyroidism of renal origin: Secondary | ICD-10-CM | POA: Diagnosis not present

## 2019-12-04 DIAGNOSIS — D509 Iron deficiency anemia, unspecified: Secondary | ICD-10-CM | POA: Diagnosis not present

## 2019-12-05 ENCOUNTER — Ambulatory Visit: Payer: Medicare Other

## 2019-12-06 DIAGNOSIS — N186 End stage renal disease: Secondary | ICD-10-CM | POA: Diagnosis not present

## 2019-12-06 DIAGNOSIS — Z992 Dependence on renal dialysis: Secondary | ICD-10-CM | POA: Diagnosis not present

## 2019-12-06 DIAGNOSIS — D509 Iron deficiency anemia, unspecified: Secondary | ICD-10-CM | POA: Diagnosis not present

## 2019-12-06 DIAGNOSIS — D631 Anemia in chronic kidney disease: Secondary | ICD-10-CM | POA: Diagnosis not present

## 2019-12-06 DIAGNOSIS — N2581 Secondary hyperparathyroidism of renal origin: Secondary | ICD-10-CM | POA: Diagnosis not present

## 2019-12-09 DIAGNOSIS — D509 Iron deficiency anemia, unspecified: Secondary | ICD-10-CM | POA: Diagnosis not present

## 2019-12-09 DIAGNOSIS — Z992 Dependence on renal dialysis: Secondary | ICD-10-CM | POA: Diagnosis not present

## 2019-12-09 DIAGNOSIS — D631 Anemia in chronic kidney disease: Secondary | ICD-10-CM | POA: Diagnosis not present

## 2019-12-09 DIAGNOSIS — N2581 Secondary hyperparathyroidism of renal origin: Secondary | ICD-10-CM | POA: Diagnosis not present

## 2019-12-09 DIAGNOSIS — N186 End stage renal disease: Secondary | ICD-10-CM | POA: Diagnosis not present

## 2019-12-11 DIAGNOSIS — N186 End stage renal disease: Secondary | ICD-10-CM | POA: Diagnosis not present

## 2019-12-11 DIAGNOSIS — N2581 Secondary hyperparathyroidism of renal origin: Secondary | ICD-10-CM | POA: Diagnosis not present

## 2019-12-11 DIAGNOSIS — D631 Anemia in chronic kidney disease: Secondary | ICD-10-CM | POA: Diagnosis not present

## 2019-12-11 DIAGNOSIS — D509 Iron deficiency anemia, unspecified: Secondary | ICD-10-CM | POA: Diagnosis not present

## 2019-12-11 DIAGNOSIS — Z992 Dependence on renal dialysis: Secondary | ICD-10-CM | POA: Diagnosis not present

## 2019-12-12 ENCOUNTER — Telehealth (INDEPENDENT_AMBULATORY_CARE_PROVIDER_SITE_OTHER): Payer: Medicare Other | Admitting: Internal Medicine

## 2019-12-12 ENCOUNTER — Other Ambulatory Visit: Payer: Self-pay

## 2019-12-12 DIAGNOSIS — N186 End stage renal disease: Secondary | ICD-10-CM | POA: Diagnosis not present

## 2019-12-12 DIAGNOSIS — I10 Essential (primary) hypertension: Secondary | ICD-10-CM | POA: Diagnosis not present

## 2019-12-12 DIAGNOSIS — E1122 Type 2 diabetes mellitus with diabetic chronic kidney disease: Secondary | ICD-10-CM

## 2019-12-12 NOTE — Progress Notes (Signed)
Virtual Visit via Telephone Note   This visit type was conducted due to national recommendations for restrictions regarding the COVID-19 Pandemic (e.g. social distancing) in an effort to limit this patient's exposure and mitigate transmission in our community.  Due to her co-morbid illnesses, this patient is at least at moderate risk for complications without adequate follow up.  This format is felt to be most appropriate for this patient at this time.  The patient did not have access to video technology/had technical difficulties with video requiring transitioning to audio format only (telephone).  All issues noted in this document were discussed and addressed.  No physical exam could be performed with this format.  Please refer to the patient's chart for her  consent to telehealth for [facility].   Evaluation Performed:  Follow-up visit  Date:  12/12/2019   ID:  Janet Mitchell, DOB 1965/08/13, MRN [id number]  Patient Location: Home Provider Location: Office/Clinic  Location of Patient: Home Location of Provider: Telehealth Consent was obtain for visit to be over via telehealth. I verified that I am speaking with the correct person using two identifiers.  PCP:  Mckinley Jewel, MD   Chief Complaint:  Follow up of HTN  History of Present Illness:    Janet Mitchell is a 54 y.o. female with PMH of HTN and DM with ESRD on HD has a follow up televisit for hypertension. Patient states that her home BP readings have been ~140s/80s. Patient states that she is compliant to her medications, but has been less active since her fibular fracture. Patient states that her Orthopedic Surgeon has completed the follow up visits now. Patient also follows up with Nephrologist for HD 3 days in a week for her ESRD and is planned to switch to peritoneal dialysis soon.  The patient does not have symptoms concerning for COVID-19 infection (fever, chills, cough, or new shortness of breath).   Past  Medical, Surgical, Social History, Allergies, and Medications have been Reviewed.  Past Medical History:  Diagnosis Date  . Anemia   . Arthritis   . CHF (congestive heart failure) ([place])    pt states she has been cleared of heart failure/disease  . Chronic cholecystitis with calculus   . Chronic kidney disease    Phreesia 12/12/2019  . COVID-19 virus infection 03/2019  . ESRD (end stage renal disease) on dialysis [facility])    Dialysis M-W-F  . Headache    "a few/wk" (03/09/2018) - no longer having these  . History of blood transfusion 10/2017   "low blood count" (03/09/2018)  . Hypertension   . Seasonal allergies   . Spinal headache   . Type II diabetes mellitus ([place])    Past Surgical History:  Procedure Laterality Date  . AMPUTATION TOE Left 2013   Great toe  . AV FISTULA PLACEMENT Left 07/22/2019   Procedure: LEFT ARM BASILIC ARTERIOVENOUS (AV) FISTULA CREATION;  Surgeon: Rosetta Posner, MD;  Location: [place];  Service: Vascular;  Laterality: Left;  . BASCILIC VEIN TRANSPOSITION Left 10/17/2019   Procedure: LEFT SECOND STAGE BASCILIC VEIN TRANSPOSITION;  Surgeon: Rosetta Posner, MD;  Location: [place];  Service: Vascular;  Laterality: Left;  . BIOPSY  10/24/2018   Procedure: BIOPSY;  Surgeon: Daneil Dolin, MD;  [facility];  Service: Endoscopy;;  right and left colon  . CATARACT EXTRACTION W/ INTRAOCULAR LENS IMPLANT Right   . CESAREAN SECTION  1994; 1998  . CHOLECYSTECTOMY N/A 03/09/2018  Procedure: LAPAROSCOPIC CHOLECYSTECTOMY WITH INTRAOPERATIVE CHOLANGIOGRAM ERAS PATHWAY;  Surgeon: Donnie Mesa, MD;  Location: [place];  Service: General;  Laterality: N/A;  . COLONOSCOPY N/A 10/24/2018   Procedure: COLONOSCOPY;  Surgeon: Daneil Dolin, MD;  [facility];  Service: Endoscopy;  Laterality: N/A;  1:00pm  . EYE SURGERY Left 05/15/2018   Removal of blood in the globe (due to DM)  . FLEXIBLE SIGMOIDOSCOPY N/A 11/24/2017   Procedure: FLEXIBLE SIGMOIDOSCOPY;   Surgeon: Daneil Dolin, MD;  [facility];  Service: Endoscopy;  Laterality: N/A;  . IR FLUORO GUIDE CV LINE RIGHT  07/17/2019  . IR PARACENTESIS  07/09/2019  . IR US GUIDE VASC ACCESS RIGHT  07/17/2019  . LAPAROSCOPIC CHOLECYSTECTOMY  03/09/2018  . PERITONEAL CATHETER INSERTION  2017  . POLYPECTOMY  10/24/2018   Procedure: POLYPECTOMY;  Surgeon: Daneil Dolin, MD;  [facility];  Service: Endoscopy;;  . TOTAL HIP ARTHROPLASTY Left 1997     Current Meds  Medication Sig  . acetaminophen (TYLENOL) 325 MG tablet Take 650 mg by mouth every 6 (six) hours as needed (pain).   Marland Kitchen amLODipine (NORVASC) 10 MG tablet Take 1 tablet (10 mg total) by mouth daily.  Marland Kitchen aspirin EC 81 MG tablet Take 1 tablet (81 mg total) by mouth daily.  . Blood Glucose Monitoring Suppl (ACCU-CHEK GUIDE) w/Device KIT 1 Piece by Does not apply route as directed.  . calcitRIOL (ROCALTROL) 0.5 MCG capsule Take 1-2 capsules (0.5-1 mcg total) by mouth See admin instructions. 0.72mg on Mondays through Fridays and take 159m on Saturdays and Sundays (Patient taking differently: Take 0.5-1 mcg by mouth See admin instructions. Take 0.22m73mon Mondays through Fridays and take 1mc48mn Saturdays and Sundays)  . cetirizine (ZYRTEC) 10 MG tablet Take 1 tablet (10 mg total) by mouth daily.  . glMarland Kitchencose blood (ACCU-CHEK GUIDE) test strip Use as instructed  . HYDROcodone-acetaminophen (NORCO/VICODIN) 5-325 MG tablet Take 1 tablet by mouth every 6 (six) hours as needed for moderate pain.  . inMarland Kitchenulin glargine, 2 Unit Dial, (TOUJEO MAX SOLOSTAR) 300 UNIT/ML Solostar Pen Inject 10 Units into the skin daily. (Patient taking differently: Inject 10 Units into the skin at bedtime. )  . labetalol (NORMODYNE) 300 MG tablet Take 300 mg by mouth 2 (two) times daily.   . loMarland Kitchenartan (COZAAR) 100 MG tablet Take 1 tablet (100 mg total) by mouth daily.  . multivitamin (RENA-VIT) TABS tablet Take 1 tablet by mouth at bedtime.   Current  Facility-Administered Medications for the 12/12/19 encounter (Video Visit) with PateLindell Spar  Medication  . ferrous sulfate tablet 325 mg     Allergies:   Patient has no known allergies.   ROS:   Please see the history of present illness. Review of Systems  Constitutional: Negative for chills and fever.  HENT: Negative for congestion and sore throat.   Eyes: Negative for pain and discharge.  Respiratory: Negative for cough and shortness of breath.   Cardiovascular: Negative for chest pain and palpitations.  Gastrointestinal: Negative for abdominal pain, constipation, diarrhea, nausea and vomiting.  Genitourinary: Negative for dysuria and hematuria.  Neurological: Negative for dizziness and headaches.  Psychiatric/Behavioral: The patient has insomnia. The patient is not nervous/anxious.      All other systems reviewed and are negative.   Labs/Other Tests and Data Reviewed:    Recent Labs: 07/30/2019: B Natriuretic Peptide 3,228.2 08/29/2019: ALT 15; Magnesium 1.7 10/17/2019: BUN 42; Creatinine, Ser 4.70; Hemoglobin 11.2; Platelets  167; Potassium 4.7; Sodium 138   Recent Lipid Panel Lab Results  Component Value Date/Time   CHOL 114 08/29/2019 03:06 PM   TRIG 91 08/29/2019 03:06 PM   HDL 35 (L) 08/29/2019 03:06 PM   CHOLHDL 3.3 08/29/2019 03:06 PM   LDLCALC 62 08/29/2019 03:06 PM    Wt Readings from Last 3 Encounters:  12/12/19 238 lb (108 kg)  11/21/19 238 lb (108 kg)  11/14/19 239 lb 6.4 oz (108.6 kg)     Objective:    Vital Signs:  BP (!) 156/77   Ht '5\' 8"'  (1.727 m)   Wt 238 lb (108 kg)   BMI 36.19 kg/m     ASSESSMENT & PLAN:    Essential hypertension BP ranges in 140s/80s On Amlodipine, Losartan and Labetalol Has been less active recently due to ankle pain Advised for in-person visit to check for BP in clinic Advised to increase activity DASH diet advised, weight loss would be beneficial Patient expressed understanding  Type 2 diabetes mellitus  with ESRD (end-stage renal disease) ([place]) Uses Toujeo at nighttime Will check HbA1C in next visit Follows up with Nephrologist for ESRD on HD Plan to switch to peritoneal dialysis  Insomnia Advised to take OTC Melatonin  Time:   Today, I have spent 15 minutes with the patient with telehealth technology discussing the above problems.     Medication Adjustments/Labs and Tests Ordered: Current medicines are reviewed at length with the patient today.  Concerns regarding medicines are outlined above.   Tests Ordered: Orders Placed This Encounter  Procedures  . CBC with Differential  . CMP14+EGFR  . Lipid Profile  . HgB A1c  . Vitamin D 1,25 dihydroxy    Medication Changes: No orders of the defined types were placed in this encounter.   Disposition:  Follow up  Signed, Lindell Spar, MD  12/12/2019 2:31 PM     [facility]

## 2019-12-12 NOTE — Assessment & Plan Note (Addendum)
BP ranges in 140s/80s On Amlodipine, Losartan and Labetalol Has been less active recently due to ankle pain Advised for in-person visit to check for BP in clinic Advised to increase activity DASH diet advised, weight loss would be beneficial Patient expressed understanding

## 2019-12-12 NOTE — Patient Instructions (Signed)
Please take Melatonin 5 mg once daily for sleeping problem.  Follow DASH diet for Hypertension.   DASH stands for Dietary Approaches to Stop Hypertension. The DASH diet is a healthy-eating plan designed to help treat or prevent high blood pressure (hypertension).  The DASH diet includes foods that are rich in potassium, calcium and magnesium. These nutrients help control blood pressure. The diet limits foods that are high in sodium, saturated fat and added sugars.  Studies have shown that the DASH diet can lower blood pressure in as little as two weeks. The diet can also lower low-density lipoprotein (LDL or "bad") cholesterol levels in the blood. High blood pressure and high LDL cholesterol levels are two major risk factors for heart disease and stroke.    DASH diet: Recommended servings The DASH diet provides daily and weekly nutritional goals. The number of servings you should have depends on your daily calorie needs.  Here's a look at the recommended servings from each food group for a 2,000-calorie-a-day DASH diet:  Grains: 6 to 8 servings a day. One serving is one slice bread, 1 ounce dry cereal, or 1/2 cup cooked cereal, rice or pasta. Vegetables: 4 to 5 servings a day. One serving is 1 cup raw leafy green vegetable, 1/2 cup cut-up raw or cooked vegetables, or 1/2 cup vegetable juice. Fruits: 4 to 5 servings a day. One serving is one medium fruit, 1/2 cup fresh, frozen or canned fruit, or 1/2 cup fruit juice. Fat-free or low-fat dairy products: 2 to 3 servings a day. One serving is 1 cup milk or yogurt, or 1 1/2 ounces cheese. Lean meats, poultry and fish: six 1-ounce servings or fewer a day. One serving is 1 ounce cooked meat, poultry or fish, or 1 egg. Nuts, seeds and legumes: 4 to 5 servings a week. One serving is 1/3 cup nuts, 2 tablespoons peanut butter, 2 tablespoons seeds, or 1/2 cup cooked legumes (dried beans or peas). Fats and oils: 2 to 3 servings a day. One serving is 1  teaspoon soft margarine, 1 teaspoon vegetable oil, 1 tablespoon mayonnaise or 2 tablespoons salad dressing. Sweets and added sugars: 5 servings or fewer a week. One serving is 1 tablespoon sugar, jelly or jam, 1/2 cup sorbet, or 1 cup lemonade.

## 2019-12-12 NOTE — Assessment & Plan Note (Signed)
Uses Toujeo at nighttime Will check HbA1C in next visit Follows up with Nephrologist for ESRD on HD Plan to switch to peritoneal dialysis

## 2019-12-14 DIAGNOSIS — N2581 Secondary hyperparathyroidism of renal origin: Secondary | ICD-10-CM | POA: Diagnosis not present

## 2019-12-14 DIAGNOSIS — D509 Iron deficiency anemia, unspecified: Secondary | ICD-10-CM | POA: Diagnosis not present

## 2019-12-14 DIAGNOSIS — D631 Anemia in chronic kidney disease: Secondary | ICD-10-CM | POA: Diagnosis not present

## 2019-12-14 DIAGNOSIS — Z992 Dependence on renal dialysis: Secondary | ICD-10-CM | POA: Diagnosis not present

## 2019-12-14 DIAGNOSIS — N186 End stage renal disease: Secondary | ICD-10-CM | POA: Diagnosis not present

## 2019-12-16 DIAGNOSIS — Z992 Dependence on renal dialysis: Secondary | ICD-10-CM | POA: Diagnosis not present

## 2019-12-16 DIAGNOSIS — N186 End stage renal disease: Secondary | ICD-10-CM | POA: Diagnosis not present

## 2019-12-16 DIAGNOSIS — D509 Iron deficiency anemia, unspecified: Secondary | ICD-10-CM | POA: Diagnosis not present

## 2019-12-16 DIAGNOSIS — N2581 Secondary hyperparathyroidism of renal origin: Secondary | ICD-10-CM | POA: Diagnosis not present

## 2019-12-16 DIAGNOSIS — D631 Anemia in chronic kidney disease: Secondary | ICD-10-CM | POA: Diagnosis not present

## 2019-12-17 DIAGNOSIS — N186 End stage renal disease: Secondary | ICD-10-CM | POA: Diagnosis not present

## 2019-12-17 DIAGNOSIS — R1084 Generalized abdominal pain: Secondary | ICD-10-CM | POA: Diagnosis not present

## 2019-12-17 DIAGNOSIS — E1122 Type 2 diabetes mellitus with diabetic chronic kidney disease: Secondary | ICD-10-CM | POA: Diagnosis not present

## 2019-12-17 DIAGNOSIS — R296 Repeated falls: Secondary | ICD-10-CM | POA: Diagnosis not present

## 2019-12-17 DIAGNOSIS — Z992 Dependence on renal dialysis: Secondary | ICD-10-CM | POA: Diagnosis not present

## 2019-12-17 DIAGNOSIS — Z6837 Body mass index (BMI) 37.0-37.9, adult: Secondary | ICD-10-CM | POA: Diagnosis not present

## 2019-12-18 DIAGNOSIS — N2581 Secondary hyperparathyroidism of renal origin: Secondary | ICD-10-CM | POA: Diagnosis not present

## 2019-12-18 DIAGNOSIS — D631 Anemia in chronic kidney disease: Secondary | ICD-10-CM | POA: Diagnosis not present

## 2019-12-18 DIAGNOSIS — Z992 Dependence on renal dialysis: Secondary | ICD-10-CM | POA: Diagnosis not present

## 2019-12-18 DIAGNOSIS — D509 Iron deficiency anemia, unspecified: Secondary | ICD-10-CM | POA: Diagnosis not present

## 2019-12-18 DIAGNOSIS — N186 End stage renal disease: Secondary | ICD-10-CM | POA: Diagnosis not present

## 2019-12-19 ENCOUNTER — Ambulatory Visit: Payer: Medicare Other

## 2019-12-20 DIAGNOSIS — D509 Iron deficiency anemia, unspecified: Secondary | ICD-10-CM | POA: Diagnosis not present

## 2019-12-20 DIAGNOSIS — N186 End stage renal disease: Secondary | ICD-10-CM | POA: Diagnosis not present

## 2019-12-20 DIAGNOSIS — N2581 Secondary hyperparathyroidism of renal origin: Secondary | ICD-10-CM | POA: Diagnosis not present

## 2019-12-20 DIAGNOSIS — D631 Anemia in chronic kidney disease: Secondary | ICD-10-CM | POA: Diagnosis not present

## 2019-12-20 DIAGNOSIS — Z992 Dependence on renal dialysis: Secondary | ICD-10-CM | POA: Diagnosis not present

## 2019-12-23 DIAGNOSIS — D509 Iron deficiency anemia, unspecified: Secondary | ICD-10-CM | POA: Diagnosis not present

## 2019-12-23 DIAGNOSIS — D631 Anemia in chronic kidney disease: Secondary | ICD-10-CM | POA: Diagnosis not present

## 2019-12-23 DIAGNOSIS — N2581 Secondary hyperparathyroidism of renal origin: Secondary | ICD-10-CM | POA: Diagnosis not present

## 2019-12-23 DIAGNOSIS — Z992 Dependence on renal dialysis: Secondary | ICD-10-CM | POA: Diagnosis not present

## 2019-12-23 DIAGNOSIS — N186 End stage renal disease: Secondary | ICD-10-CM | POA: Diagnosis not present

## 2019-12-24 ENCOUNTER — Ambulatory Visit: Payer: Medicare Other | Admitting: Podiatry

## 2019-12-24 ENCOUNTER — Encounter: Payer: Self-pay | Admitting: Gastroenterology

## 2019-12-24 ENCOUNTER — Telehealth (INDEPENDENT_AMBULATORY_CARE_PROVIDER_SITE_OTHER): Payer: Medicare Other | Admitting: Gastroenterology

## 2019-12-24 ENCOUNTER — Telehealth: Payer: Self-pay | Admitting: Gastroenterology

## 2019-12-24 ENCOUNTER — Other Ambulatory Visit: Payer: Self-pay

## 2019-12-24 ENCOUNTER — Encounter: Payer: Self-pay | Admitting: Internal Medicine

## 2019-12-24 DIAGNOSIS — R197 Diarrhea, unspecified: Secondary | ICD-10-CM

## 2019-12-24 NOTE — Telephone Encounter (Signed)
Patient was converted to video visit today after she reported having trouble getting here due to diarrhea this morning.   I attempted to connect with patient several times on video and called to assist patient with connection. Video visit could not be successfully completed ?internet issues on patient's part.   PLEASE SCHEDULE IN PERSON OV.

## 2019-12-24 NOTE — Telephone Encounter (Signed)
SCHEDULED PATIENT FOR OFFICE VISIT

## 2019-12-24 NOTE — Progress Notes (Signed)
Patient rescheduled due to issues with virtual visit.

## 2019-12-25 DIAGNOSIS — D509 Iron deficiency anemia, unspecified: Secondary | ICD-10-CM | POA: Diagnosis not present

## 2019-12-25 DIAGNOSIS — Z992 Dependence on renal dialysis: Secondary | ICD-10-CM | POA: Diagnosis not present

## 2019-12-25 DIAGNOSIS — D631 Anemia in chronic kidney disease: Secondary | ICD-10-CM | POA: Diagnosis not present

## 2019-12-25 DIAGNOSIS — N186 End stage renal disease: Secondary | ICD-10-CM | POA: Diagnosis not present

## 2019-12-25 DIAGNOSIS — N2581 Secondary hyperparathyroidism of renal origin: Secondary | ICD-10-CM | POA: Diagnosis not present

## 2019-12-26 DIAGNOSIS — N186 End stage renal disease: Secondary | ICD-10-CM | POA: Diagnosis not present

## 2019-12-26 DIAGNOSIS — I120 Hypertensive chronic kidney disease with stage 5 chronic kidney disease or end stage renal disease: Secondary | ICD-10-CM | POA: Diagnosis not present

## 2019-12-26 DIAGNOSIS — T85611A Breakdown (mechanical) of intraperitoneal dialysis catheter, initial encounter: Secondary | ICD-10-CM | POA: Diagnosis not present

## 2019-12-26 DIAGNOSIS — R188 Other ascites: Secondary | ICD-10-CM | POA: Diagnosis not present

## 2019-12-26 DIAGNOSIS — S301XXA Contusion of abdominal wall, initial encounter: Secondary | ICD-10-CM

## 2019-12-26 DIAGNOSIS — D649 Anemia, unspecified: Secondary | ICD-10-CM | POA: Diagnosis not present

## 2019-12-26 DIAGNOSIS — Z8781 Personal history of (healed) traumatic fracture: Secondary | ICD-10-CM | POA: Diagnosis not present

## 2019-12-26 DIAGNOSIS — K660 Peritoneal adhesions (postprocedural) (postinfection): Secondary | ICD-10-CM | POA: Diagnosis not present

## 2019-12-26 DIAGNOSIS — Z794 Long term (current) use of insulin: Secondary | ICD-10-CM | POA: Diagnosis not present

## 2019-12-26 DIAGNOSIS — Z4902 Encounter for fitting and adjustment of peritoneal dialysis catheter: Secondary | ICD-10-CM | POA: Diagnosis not present

## 2019-12-26 DIAGNOSIS — E1122 Type 2 diabetes mellitus with diabetic chronic kidney disease: Secondary | ICD-10-CM | POA: Diagnosis not present

## 2019-12-26 DIAGNOSIS — N185 Chronic kidney disease, stage 5: Secondary | ICD-10-CM | POA: Diagnosis not present

## 2019-12-26 DIAGNOSIS — Z992 Dependence on renal dialysis: Secondary | ICD-10-CM | POA: Diagnosis not present

## 2019-12-26 DIAGNOSIS — Z23 Encounter for immunization: Secondary | ICD-10-CM | POA: Diagnosis not present

## 2019-12-26 DIAGNOSIS — I4510 Unspecified right bundle-branch block: Secondary | ICD-10-CM | POA: Diagnosis not present

## 2019-12-26 DIAGNOSIS — Z9181 History of falling: Secondary | ICD-10-CM | POA: Diagnosis not present

## 2019-12-26 DIAGNOSIS — T85691A Other mechanical complication of intraperitoneal dialysis catheter, initial encounter: Secondary | ICD-10-CM | POA: Diagnosis not present

## 2019-12-26 DIAGNOSIS — S36892A Contusion of other intra-abdominal organs, initial encounter: Secondary | ICD-10-CM | POA: Diagnosis not present

## 2019-12-26 HISTORY — DX: Contusion of abdominal wall, initial encounter: S30.1XXA

## 2019-12-27 DIAGNOSIS — N186 End stage renal disease: Secondary | ICD-10-CM | POA: Diagnosis not present

## 2019-12-27 DIAGNOSIS — K660 Peritoneal adhesions (postprocedural) (postinfection): Secondary | ICD-10-CM | POA: Diagnosis not present

## 2019-12-27 DIAGNOSIS — I120 Hypertensive chronic kidney disease with stage 5 chronic kidney disease or end stage renal disease: Secondary | ICD-10-CM | POA: Diagnosis not present

## 2019-12-27 DIAGNOSIS — T85611A Breakdown (mechanical) of intraperitoneal dialysis catheter, initial encounter: Secondary | ICD-10-CM | POA: Diagnosis not present

## 2019-12-27 DIAGNOSIS — E1122 Type 2 diabetes mellitus with diabetic chronic kidney disease: Secondary | ICD-10-CM | POA: Diagnosis not present

## 2019-12-27 DIAGNOSIS — R188 Other ascites: Secondary | ICD-10-CM | POA: Diagnosis not present

## 2019-12-28 DIAGNOSIS — N186 End stage renal disease: Secondary | ICD-10-CM | POA: Diagnosis not present

## 2019-12-28 DIAGNOSIS — N2581 Secondary hyperparathyroidism of renal origin: Secondary | ICD-10-CM | POA: Diagnosis not present

## 2019-12-28 DIAGNOSIS — D631 Anemia in chronic kidney disease: Secondary | ICD-10-CM | POA: Diagnosis not present

## 2019-12-28 DIAGNOSIS — D509 Iron deficiency anemia, unspecified: Secondary | ICD-10-CM | POA: Diagnosis not present

## 2019-12-28 DIAGNOSIS — K659 Peritonitis, unspecified: Secondary | ICD-10-CM | POA: Diagnosis not present

## 2019-12-28 DIAGNOSIS — Z992 Dependence on renal dialysis: Secondary | ICD-10-CM | POA: Diagnosis not present

## 2019-12-30 DIAGNOSIS — Z992 Dependence on renal dialysis: Secondary | ICD-10-CM | POA: Diagnosis not present

## 2019-12-30 DIAGNOSIS — K659 Peritonitis, unspecified: Secondary | ICD-10-CM | POA: Diagnosis not present

## 2019-12-30 DIAGNOSIS — N2581 Secondary hyperparathyroidism of renal origin: Secondary | ICD-10-CM | POA: Diagnosis not present

## 2019-12-30 DIAGNOSIS — N186 End stage renal disease: Secondary | ICD-10-CM | POA: Diagnosis not present

## 2019-12-30 DIAGNOSIS — D631 Anemia in chronic kidney disease: Secondary | ICD-10-CM | POA: Diagnosis not present

## 2019-12-30 DIAGNOSIS — D509 Iron deficiency anemia, unspecified: Secondary | ICD-10-CM | POA: Diagnosis not present

## 2020-01-01 DIAGNOSIS — Z992 Dependence on renal dialysis: Secondary | ICD-10-CM | POA: Diagnosis not present

## 2020-01-01 DIAGNOSIS — N186 End stage renal disease: Secondary | ICD-10-CM | POA: Diagnosis not present

## 2020-01-01 DIAGNOSIS — K659 Peritonitis, unspecified: Secondary | ICD-10-CM | POA: Diagnosis not present

## 2020-01-01 DIAGNOSIS — N2581 Secondary hyperparathyroidism of renal origin: Secondary | ICD-10-CM | POA: Diagnosis not present

## 2020-01-01 DIAGNOSIS — D509 Iron deficiency anemia, unspecified: Secondary | ICD-10-CM | POA: Diagnosis not present

## 2020-01-01 DIAGNOSIS — D631 Anemia in chronic kidney disease: Secondary | ICD-10-CM | POA: Diagnosis not present

## 2020-01-03 DIAGNOSIS — D509 Iron deficiency anemia, unspecified: Secondary | ICD-10-CM | POA: Diagnosis not present

## 2020-01-03 DIAGNOSIS — D631 Anemia in chronic kidney disease: Secondary | ICD-10-CM | POA: Diagnosis not present

## 2020-01-03 DIAGNOSIS — N2581 Secondary hyperparathyroidism of renal origin: Secondary | ICD-10-CM | POA: Diagnosis not present

## 2020-01-03 DIAGNOSIS — K659 Peritonitis, unspecified: Secondary | ICD-10-CM | POA: Diagnosis not present

## 2020-01-03 DIAGNOSIS — N186 End stage renal disease: Secondary | ICD-10-CM | POA: Diagnosis not present

## 2020-01-03 DIAGNOSIS — Z992 Dependence on renal dialysis: Secondary | ICD-10-CM | POA: Diagnosis not present

## 2020-01-06 DIAGNOSIS — E119 Type 2 diabetes mellitus without complications: Secondary | ICD-10-CM | POA: Diagnosis not present

## 2020-01-06 DIAGNOSIS — D509 Iron deficiency anemia, unspecified: Secondary | ICD-10-CM | POA: Diagnosis not present

## 2020-01-06 DIAGNOSIS — K659 Peritonitis, unspecified: Secondary | ICD-10-CM | POA: Diagnosis not present

## 2020-01-06 DIAGNOSIS — D631 Anemia in chronic kidney disease: Secondary | ICD-10-CM | POA: Diagnosis not present

## 2020-01-06 DIAGNOSIS — Z992 Dependence on renal dialysis: Secondary | ICD-10-CM | POA: Diagnosis not present

## 2020-01-06 DIAGNOSIS — Z794 Long term (current) use of insulin: Secondary | ICD-10-CM | POA: Diagnosis not present

## 2020-01-06 DIAGNOSIS — N186 End stage renal disease: Secondary | ICD-10-CM | POA: Diagnosis not present

## 2020-01-06 DIAGNOSIS — N2581 Secondary hyperparathyroidism of renal origin: Secondary | ICD-10-CM | POA: Diagnosis not present

## 2020-01-08 DIAGNOSIS — K659 Peritonitis, unspecified: Secondary | ICD-10-CM | POA: Diagnosis not present

## 2020-01-08 DIAGNOSIS — N2581 Secondary hyperparathyroidism of renal origin: Secondary | ICD-10-CM | POA: Diagnosis not present

## 2020-01-08 DIAGNOSIS — D631 Anemia in chronic kidney disease: Secondary | ICD-10-CM | POA: Diagnosis not present

## 2020-01-08 DIAGNOSIS — N186 End stage renal disease: Secondary | ICD-10-CM | POA: Diagnosis not present

## 2020-01-08 DIAGNOSIS — D509 Iron deficiency anemia, unspecified: Secondary | ICD-10-CM | POA: Diagnosis not present

## 2020-01-08 DIAGNOSIS — Z992 Dependence on renal dialysis: Secondary | ICD-10-CM | POA: Diagnosis not present

## 2020-01-09 ENCOUNTER — Ambulatory Visit: Payer: Medicare Other | Admitting: Internal Medicine

## 2020-01-10 DIAGNOSIS — N186 End stage renal disease: Secondary | ICD-10-CM | POA: Diagnosis not present

## 2020-01-10 DIAGNOSIS — Z992 Dependence on renal dialysis: Secondary | ICD-10-CM | POA: Diagnosis not present

## 2020-01-10 DIAGNOSIS — D509 Iron deficiency anemia, unspecified: Secondary | ICD-10-CM | POA: Diagnosis not present

## 2020-01-10 DIAGNOSIS — K659 Peritonitis, unspecified: Secondary | ICD-10-CM | POA: Diagnosis not present

## 2020-01-10 DIAGNOSIS — D631 Anemia in chronic kidney disease: Secondary | ICD-10-CM | POA: Diagnosis not present

## 2020-01-10 DIAGNOSIS — N2581 Secondary hyperparathyroidism of renal origin: Secondary | ICD-10-CM | POA: Diagnosis not present

## 2020-01-13 DIAGNOSIS — Z992 Dependence on renal dialysis: Secondary | ICD-10-CM | POA: Diagnosis not present

## 2020-01-13 DIAGNOSIS — D631 Anemia in chronic kidney disease: Secondary | ICD-10-CM | POA: Diagnosis not present

## 2020-01-13 DIAGNOSIS — N2581 Secondary hyperparathyroidism of renal origin: Secondary | ICD-10-CM | POA: Diagnosis not present

## 2020-01-13 DIAGNOSIS — D509 Iron deficiency anemia, unspecified: Secondary | ICD-10-CM | POA: Diagnosis not present

## 2020-01-13 DIAGNOSIS — N186 End stage renal disease: Secondary | ICD-10-CM | POA: Diagnosis not present

## 2020-01-13 DIAGNOSIS — K659 Peritonitis, unspecified: Secondary | ICD-10-CM | POA: Diagnosis not present

## 2020-01-15 DIAGNOSIS — N186 End stage renal disease: Secondary | ICD-10-CM | POA: Diagnosis not present

## 2020-01-15 DIAGNOSIS — D631 Anemia in chronic kidney disease: Secondary | ICD-10-CM | POA: Diagnosis not present

## 2020-01-15 DIAGNOSIS — K659 Peritonitis, unspecified: Secondary | ICD-10-CM | POA: Diagnosis not present

## 2020-01-15 DIAGNOSIS — N2581 Secondary hyperparathyroidism of renal origin: Secondary | ICD-10-CM | POA: Diagnosis not present

## 2020-01-15 DIAGNOSIS — Z992 Dependence on renal dialysis: Secondary | ICD-10-CM | POA: Diagnosis not present

## 2020-01-15 DIAGNOSIS — D509 Iron deficiency anemia, unspecified: Secondary | ICD-10-CM | POA: Diagnosis not present

## 2020-01-16 DIAGNOSIS — N186 End stage renal disease: Secondary | ICD-10-CM | POA: Diagnosis not present

## 2020-01-16 DIAGNOSIS — D509 Iron deficiency anemia, unspecified: Secondary | ICD-10-CM | POA: Diagnosis not present

## 2020-01-16 DIAGNOSIS — K659 Peritonitis, unspecified: Secondary | ICD-10-CM | POA: Diagnosis not present

## 2020-01-16 DIAGNOSIS — N2581 Secondary hyperparathyroidism of renal origin: Secondary | ICD-10-CM | POA: Diagnosis not present

## 2020-01-16 DIAGNOSIS — D631 Anemia in chronic kidney disease: Secondary | ICD-10-CM | POA: Diagnosis not present

## 2020-01-16 DIAGNOSIS — Z992 Dependence on renal dialysis: Secondary | ICD-10-CM | POA: Diagnosis not present

## 2020-01-21 DIAGNOSIS — N2581 Secondary hyperparathyroidism of renal origin: Secondary | ICD-10-CM | POA: Diagnosis not present

## 2020-01-21 DIAGNOSIS — N186 End stage renal disease: Secondary | ICD-10-CM | POA: Diagnosis not present

## 2020-01-21 DIAGNOSIS — D631 Anemia in chronic kidney disease: Secondary | ICD-10-CM | POA: Diagnosis not present

## 2020-01-21 DIAGNOSIS — D509 Iron deficiency anemia, unspecified: Secondary | ICD-10-CM | POA: Diagnosis not present

## 2020-01-21 DIAGNOSIS — Z992 Dependence on renal dialysis: Secondary | ICD-10-CM | POA: Diagnosis not present

## 2020-01-21 DIAGNOSIS — K659 Peritonitis, unspecified: Secondary | ICD-10-CM | POA: Diagnosis not present

## 2020-01-22 ENCOUNTER — Other Ambulatory Visit: Payer: Self-pay

## 2020-01-22 ENCOUNTER — Ambulatory Visit (INDEPENDENT_AMBULATORY_CARE_PROVIDER_SITE_OTHER): Payer: Medicare Other | Admitting: Gastroenterology

## 2020-01-22 ENCOUNTER — Telehealth: Payer: Self-pay | Admitting: *Deleted

## 2020-01-22 ENCOUNTER — Encounter: Payer: Self-pay | Admitting: Gastroenterology

## 2020-01-22 DIAGNOSIS — Z992 Dependence on renal dialysis: Secondary | ICD-10-CM | POA: Diagnosis not present

## 2020-01-22 DIAGNOSIS — D509 Iron deficiency anemia, unspecified: Secondary | ICD-10-CM | POA: Diagnosis not present

## 2020-01-22 DIAGNOSIS — N2581 Secondary hyperparathyroidism of renal origin: Secondary | ICD-10-CM | POA: Diagnosis not present

## 2020-01-22 DIAGNOSIS — N186 End stage renal disease: Secondary | ICD-10-CM | POA: Diagnosis not present

## 2020-01-22 DIAGNOSIS — R197 Diarrhea, unspecified: Secondary | ICD-10-CM

## 2020-01-22 DIAGNOSIS — K659 Peritonitis, unspecified: Secondary | ICD-10-CM | POA: Diagnosis not present

## 2020-01-22 DIAGNOSIS — D631 Anemia in chronic kidney disease: Secondary | ICD-10-CM | POA: Diagnosis not present

## 2020-01-22 MED ORDER — CHOLESTYRAMINE 4 G PO PACK: 2.0000 g | PACK | Freq: Every day | ORAL | 3 refills | Status: DC

## 2020-01-22 NOTE — Progress Notes (Signed)
Primary Care Physician:  Lindell Spar, MD Primary GI:  Garfield Cornea, MD Patient Location: Dialysis Provider Location: [facility] office  Reason for Visit:  Chief Complaint  Patient presents with  . Diarrhea    "always, it is better just different". Not taking anything for her bowels   Persons present on the virtual encounter, with roles: Patient, myself (provider),Mindy Estudillo CMA (updated meds and allergies)  Total time (minutes) spent on medical discussion: 20 minutes  Due to COVID-19, visit was conducted using Mychart video method.  Visit was requested by patient.  Virtual Visit via Mychart video  I connected with Janet Mitchell on 01/22/20 at 11:30 AM EDT by Mychart video and verified that I am speaking with the correct person using two identifiers.   I discussed the limitations, risks, security and privacy concerns of performing an evaluation and management service by telephone/video and the availability of in person appointments. I also discussed with the patient that there may be a patient responsible charge related to this service. The patient expressed understanding and agreed to proceed.   HPI:   Janet Mitchell is a 54 y.o. female who presents for virtual visit regarding diarrhea.   Last seen in 04/2019. History of diarrhea since cholecystectomy in 2019. She is pursuing renal transplant. Initially was doing peritoneal dialysis but transitioned to hemodialysis after developing peritonitis back in May. Colonoscopy 2020 with 57m polyp removed (tubular adenoma). Random colon biopsies were negative. Repeat TCS in 3 years.   Colestipol helped diarrhea but caused cramping. Bentyl not effective. Imodium seems to help but diarrhea comes back when stopped. Levsin ok per nephrology if needed. Was prescribed before but patient never started on it due to hospitalization with COVID in 03/2019. While in the hospital she was started on cholesyramine, levsin, xifaxam,  amitriptyline none of which she stayed on for any length of time. H.pylori and celiac panel negative. CT a/p without contrast found heavily calcified aorta and branch vessels query mesenteric ischemia. CTA to follow ruled out mesenteric ischemia. She has subsequently developed peritoneal catheter peritonitis complicated by intra-abdominal cyst with bleeding requiring 4 units of packed red blood cells.  In the hospital back in April with peritonitis.  Stool studies obtained showing positive C. difficile antigen, C. difficile toxin negative.  Reflex to PCR positive for toxigenic C. difficile with little to no toxin production.  It was elected not to treat her for C. difficile because her symptoms were chronic.  Patient is currently on doxycycline, almost completed.   Patient is currently at diaylsis. May have BM 2-3 days normal. Than may have 2 days of diarrhea. Some days formed or no BMs. On bad days, several in a row until empty. No melena, brbpr. Not a lot of abdominal pain. Some cramps before stools. Stools can be liquid to watery. Will take imodium 2 at a time and repeat if persistent stools. Stools will stop but come back a couple of days later with diarrhea. Some heartburn. Takes over the counter antacids with relief.      Current Outpatient Medications  Medication Sig Dispense Refill  . acetaminophen (TYLENOL) 325 MG tablet Take 650 mg by mouth every 6 (six) hours as needed (pain).     .Marland KitchenamLODipine (NORVASC) 10 MG tablet Take 1 tablet (10 mg total) by mouth daily. 90 tablet 3  . aspirin EC 81 MG tablet Take 1 tablet (81 mg total) by mouth daily.    . Blood Glucose Monitoring Suppl (  ACCU-CHEK GUIDE) w/Device KIT 1 Piece by Does not apply route as directed. 1 kit 0  . calcitRIOL (ROCALTROL) 0.5 MCG capsule Take 1-2 capsules (0.5-1 mcg total) by mouth See admin instructions. 0.64mg on Mondays through Fridays and take 128m on Saturdays and Sundays (Patient taking differently: Take 0.5-1 mcg by  mouth See admin instructions. Take 0.4m42mon Mondays through Fridays and take 1mc20mn Saturdays and Sundays) 90 capsule 1  . cetirizine (ZYRTEC) 10 MG tablet Take 1 tablet (10 mg total) by mouth daily. 30 tablet 1  . glucose blood (ACCU-CHEK GUIDE) test strip Use as instructed 150 each 2  . insulin glargine, 2 Unit Dial, (TOUJEO MAX SOLOSTAR) 300 UNIT/ML Solostar Pen Inject 10 Units into the skin daily. (Patient taking differently: Inject 10 Units into the skin at bedtime. )    . labetalol (NORMODYNE) 300 MG tablet Take 300 mg by mouth 2 (two) times daily.     . loMarland Kitchenartan (COZAAR) 100 MG tablet Take 1 tablet (100 mg total) by mouth daily. 90 tablet 3  . multivitamin (RENA-VIT) TABS tablet Take 1 tablet by mouth at bedtime. 30 tablet 0   Current Facility-Administered Medications  Medication Dose Route Frequency Provider Last Rate Last Admin  . ferrous sulfate tablet 325 mg  325 mg Oral BID WC Pahwani, RinkMichell Heinrich        Past Medical History:  Diagnosis Date  . Anemia   . Arthritis   . CHF (congestive heart failure) (HCC)[place] pt states she has been cleared of heart failure/disease  . Chronic cholecystitis with calculus   . Chronic kidney disease    Phreesia 12/12/2019  . COVID-19 virus infection 03/2019  . [facility]  . Headache    "a few/wk" (03/09/2018) - no longer having these  . History of blood transfusion 10/2017   "low blood count" (03/09/2018)  . Hypertension   . Seasonal allergies   . Spinal headache   . Type II diabetes mellitus (HCC)[place]  Past Surgical History:  Procedure Laterality Date  . AMPUTATION TOE Left 2013   Great toe  . AV FISTULA PLACEMENT Left 07/22/2019   Procedure: LEFT ARM BASILIC ARTERIOVENOUS (AV) FISTULA CREATION;  Surgeon: EarlRosetta Posner;  [facility];  Laterality: Left;  . BASCILIC VEIN TRANSPOSITION Left 10/17/2019   Procedure: LEFT SECOND STAGE BASCILIC VEIN  TRANSPOSITION;  Surgeon: EarlRosetta Posner;  [facility];  Laterality: Left;  . BIOPSY  10/24/2018   Procedure: BIOPSY;  Surgeon: RourDaneil Dolin;  [facility];  Service: Endoscopy;;  right and left colon  . CATARACT EXTRACTION W/ INTRAOCULAR LENS IMPLANT Right   . CESAREAN SECTION  1994; 1998  . CHOLECYSTECTOMY N/A 03/09/2018   Procedure: LAPAROSCOPIC CHOLECYSTECTOMY WITH INTRAOPERATIVE CHOLANGIOGRAM ERAS PATHWAY;  Surgeon: TsueDonnie Mesa;  [facility];  Laterality: N/A;  . COLONOSCOPY N/A 10/24/2018   Procedure: COLONOSCOPY;  Surgeon: RourDaneil Dolin;  [facility];  Service: Endoscopy;  Laterality: N/A;  1:00pm  . EYE SURGERY Left 05/15/2018   Removal of blood in the globe (due to DM)  . FLEXIBLE SIGMOIDOSCOPY N/A 11/24/2017   Procedure: FLEXIBLE SIGMOIDOSCOPY;  Surgeon: RourDaneil Dolin;  [facility];  Service: Endoscopy;  Laterality: N/A;  . IR FLUORO GUIDE CV LINE RIGHT  07/17/2019  .  IR PARACENTESIS  07/09/2019  . IR US GUIDE VASC ACCESS RIGHT  07/17/2019  . LAPAROSCOPIC CHOLECYSTECTOMY  03/09/2018  . PERITONEAL CATHETER INSERTION  2017  . POLYPECTOMY  10/24/2018   Procedure: POLYPECTOMY;  Surgeon: Daneil Dolin, MD;  [facility];  Service: Endoscopy;;  . TOTAL HIP ARTHROPLASTY Left 1997    Family History  Problem Relation Age of Onset  . Heart disease Mother   . Thrombocytopenia Mother        TTP  . Heart failure Father   . Kidney disease Paternal Grandfather   . Colon cancer Neg Hx     Social History   Socioeconomic History  . Marital status: Married    Spouse name: Not on file  . Number of children: 2  . Years of education: Not on file  . Highest education level: Not on file  Occupational History  . Not on file  Tobacco Use  . Smoking status: Passive Smoke Exposure - Never Smoker  . Smokeless tobacco: Never Used  Vaping Use  . Vaping Use: Never used    Substance and Sexual Activity  . Alcohol use: Never  . Drug use: Never  . Sexual activity: Yes    Birth control/protection: I.U.D.    Comment: Mirena IUD  Other Topics Concern  . Not on file  Social History Narrative  . Not on file   Social Determinants of Health   Financial Resource Strain:   . Difficulty of Paying Living Expenses: Not on file  Food Insecurity:   . Worried About Charity fundraiser in the Last Year: Not on file  . Ran Out of Food in the Last Year: Not on file  Transportation Needs:   . Lack of Transportation (Medical): Not on file  . Lack of Transportation (Non-Medical): Not on file  Physical Activity:   . Days of Exercise per Week: Not on file  . Minutes of Exercise per Session: Not on file  Stress:   . Feeling of Stress : Not on file  Social Connections:   . Frequency of Communication with Friends and Family: Not on file  . Frequency of Social Gatherings with Friends and Family: Not on file  . Attends Religious Services: Not on file  . Active Member of Clubs or Organizations: Not on file  . Attends Archivist Meetings: Not on file  . Marital Status: Not on file  Intimate Partner Violence:   . Fear of Current or Ex-Partner: Not on file  . Emotionally Abused: Not on file  . Physically Abused: Not on file  . Sexually Abused: Not on file      ROS:  General: Negative for anorexia, weight loss, fever, chills, fatigue, weakness. Eyes: Negative for vision changes.  ENT: Negative for hoarseness, difficulty swallowing , nasal congestion. CV: Negative for chest pain, angina, palpitations, dyspnea on exertion, peripheral edema.  Respiratory: Negative for dyspnea at rest, dyspnea on exertion, cough, sputum, wheezing.  GI: See history of present illness. GU:  Negative for dysuria, hematuria, urinary incontinence, urinary frequency, nocturnal urination. On hemodialysis MS: Negative for joint pain, low back pain.  Derm: Negative for rash or itching.   Neuro: Negative for weakness, abnormal sensation, seizure, frequent headaches, memory loss, confusion.  Psych: Negative for anxiety, depression, suicidal ideation, hallucinations.  Endo: Negative for unusual weight change.  Heme: Negative for bruising or bleeding. Allergy: Negative for rash or hives.   Observations/Objective: Pleasant well nourished well developed. No acute distress.  Lab Results  Component Value Date   CREATININE 4.70 (H) 10/17/2019   BUN 42 (H) 10/17/2019   NA 138 10/17/2019   K 4.7 10/17/2019   CL 97 (L) 10/17/2019   CO2 30 08/29/2019   Lab Results  Component Value Date   ALT 15 08/29/2019   AST 16 08/29/2019   ALKPHOS 63 07/30/2019   BILITOT 0.5 08/29/2019   Lab Results  Component Value Date   WBC 4.8 10/17/2019   HGB 11.2 (L) 10/17/2019   HCT 33.0 (L) 10/17/2019   MCV 77.0 (L) 10/17/2019   PLT 167 10/17/2019   Lab Results  Component Value Date   IRON 18 (L) 07/08/2019   TIBC 213 (L) 07/08/2019   FERRITIN 717 (H) 07/08/2019   Lab Results  Component Value Date   VITAMINB12 478 07/08/2019   Lab Results  Component Value Date   FOLATE 8.2 07/08/2019     Assessment and Plan: 54 year old female with history of end-stage renal disease recently transitioning from peritoneal dialysis to hemodialysis due to peritonitis.  Developed a large loculated fluid collection in the ventral abdomen measuring 29 x 14 x 21 cm consistent with pseudocyst in the setting of peritoneal dialysis concerning for internal hemorrhage.  Patient had significant anemia requiring 4 units of packed red blood cells.  Presents today for follow-up of diarrhea.  She has had diarrhea persisting since 2019 when she had cholecystectomy.  Symptoms recently have been more tolerable when a couple of days in between with either no stools are normal bowel movements.  Diarrhea is unpredictable.  Generally has multiple stools in a row until she "empties out" and takes 4 Imodium's before it  stops.  Work-up as outlined above.  Likely has an element of bile salt diarrhea.  Colestid previously helped but caused abdominal cramping that she took on a regular basis.  She believes Questran helped when she took it after being the hospital earlier this year but at some point stopped taking it.  She is willing to revisit low-dose Questran.  Given C. difficile results as outlined above, multiple antibiotics received in the past several months, would revisit recollect stool for testing.  C. difficile GDH antigen in the near future.  Start with Questran 2 g once daily, not to take within 2 hours for the patient.  Follow Up Instructions:    I discussed the assessment and treatment plan with the patient. The patient was provided an opportunity to ask questions and all were answered. The patient agreed with the plan and demonstrated an understanding of the instructions. AVS mailed to patient's home address.   The patient was advised to call back or seek an in-person evaluation if the symptoms worsen or if the condition fails to improve as anticipated.  I provided 20 minutes of virtual face-to-face time during this encounter.   Neil Crouch, PA-C

## 2020-01-22 NOTE — Patient Instructions (Signed)
1. Collect stool specimen for Cdiff testing. Go by [facility] and get stool container and return once specimen collected.  2. Trial of questran 2 grams (1/2 pack) once daily. Do not take within 2 hours of other medications.

## 2020-01-22 NOTE — Telephone Encounter (Signed)
Janet Mitchell, you are scheduled for a virtual visit with your provider today.  Just as we do with appointments in the office, we must obtain your consent to participate.  Your consent will be active for this visit and any virtual visit you may have with one of our providers in the next 365 days.  If you have a MyChart account, I can also send a copy of this consent to you electronically.  All virtual visits are billed to your insurance company just like a traditional visit in the office.  As this is a virtual visit, video technology does not allow for your provider to perform a traditional examination.  This may limit your provider's ability to fully assess your condition.  If your provider identifies any concerns that need to be evaluated in person or the need to arrange testing such as labs, EKG, etc, we will make arrangements to do so.  Although advances in technology are sophisticated, we cannot ensure that it will always work on either your end or our end.  If the connection with a video visit is poor, we may have to switch to a telephone visit.  With either a video or telephone visit, we are not always able to ensure that we have a secure connection.   I need to obtain your verbal consent now.   Are you willing to proceed with your visit today?

## 2020-01-22 NOTE — Telephone Encounter (Signed)
Pt consented to a virtual visit. 

## 2020-01-24 DIAGNOSIS — D631 Anemia in chronic kidney disease: Secondary | ICD-10-CM | POA: Diagnosis not present

## 2020-01-24 DIAGNOSIS — K659 Peritonitis, unspecified: Secondary | ICD-10-CM | POA: Diagnosis not present

## 2020-01-24 DIAGNOSIS — N2581 Secondary hyperparathyroidism of renal origin: Secondary | ICD-10-CM | POA: Diagnosis not present

## 2020-01-24 DIAGNOSIS — N186 End stage renal disease: Secondary | ICD-10-CM | POA: Diagnosis not present

## 2020-01-24 DIAGNOSIS — Z992 Dependence on renal dialysis: Secondary | ICD-10-CM | POA: Diagnosis not present

## 2020-01-24 DIAGNOSIS — D509 Iron deficiency anemia, unspecified: Secondary | ICD-10-CM | POA: Diagnosis not present

## 2020-01-26 DIAGNOSIS — Z992 Dependence on renal dialysis: Secondary | ICD-10-CM | POA: Diagnosis not present

## 2020-01-26 DIAGNOSIS — N186 End stage renal disease: Secondary | ICD-10-CM | POA: Diagnosis not present

## 2020-01-27 DIAGNOSIS — D509 Iron deficiency anemia, unspecified: Secondary | ICD-10-CM | POA: Diagnosis not present

## 2020-01-27 DIAGNOSIS — N2581 Secondary hyperparathyroidism of renal origin: Secondary | ICD-10-CM | POA: Diagnosis not present

## 2020-01-27 DIAGNOSIS — D631 Anemia in chronic kidney disease: Secondary | ICD-10-CM | POA: Diagnosis not present

## 2020-01-27 DIAGNOSIS — Z992 Dependence on renal dialysis: Secondary | ICD-10-CM | POA: Diagnosis not present

## 2020-01-27 DIAGNOSIS — N186 End stage renal disease: Secondary | ICD-10-CM | POA: Diagnosis not present

## 2020-01-29 ENCOUNTER — Telehealth (INDEPENDENT_AMBULATORY_CARE_PROVIDER_SITE_OTHER): Payer: Medicare Other | Admitting: Internal Medicine

## 2020-01-29 ENCOUNTER — Encounter: Payer: Self-pay | Admitting: Internal Medicine

## 2020-01-29 ENCOUNTER — Other Ambulatory Visit: Payer: Self-pay

## 2020-01-29 DIAGNOSIS — D509 Iron deficiency anemia, unspecified: Secondary | ICD-10-CM | POA: Diagnosis not present

## 2020-01-29 DIAGNOSIS — I10 Essential (primary) hypertension: Secondary | ICD-10-CM | POA: Diagnosis not present

## 2020-01-29 DIAGNOSIS — R197 Diarrhea, unspecified: Secondary | ICD-10-CM | POA: Diagnosis not present

## 2020-01-29 DIAGNOSIS — E1122 Type 2 diabetes mellitus with diabetic chronic kidney disease: Secondary | ICD-10-CM | POA: Diagnosis not present

## 2020-01-29 DIAGNOSIS — N186 End stage renal disease: Secondary | ICD-10-CM | POA: Diagnosis not present

## 2020-01-29 DIAGNOSIS — N2581 Secondary hyperparathyroidism of renal origin: Secondary | ICD-10-CM | POA: Diagnosis not present

## 2020-01-29 DIAGNOSIS — D631 Anemia in chronic kidney disease: Secondary | ICD-10-CM | POA: Diagnosis not present

## 2020-01-29 DIAGNOSIS — G4700 Insomnia, unspecified: Secondary | ICD-10-CM | POA: Diagnosis not present

## 2020-01-29 DIAGNOSIS — Z992 Dependence on renal dialysis: Secondary | ICD-10-CM | POA: Diagnosis not present

## 2020-01-29 NOTE — Progress Notes (Signed)
Virtual Visit via Telephone Note   This visit type was conducted due to national recommendations for restrictions regarding the COVID-19 Pandemic (e.g. social distancing) in an effort to limit this patient's exposure and mitigate transmission in our community.  Due to her co-morbid illnesses, this patient is at least at moderate risk for complications without adequate follow up.  This format is felt to be most appropriate for this patient at this time.  The patient did not have access to video technology/had technical difficulties with video requiring transitioning to audio format only (telephone).  All issues noted in this document were discussed and addressed.  No physical exam could be performed with this format.  Evaluation Performed:  Follow-up visit  Date:  01/29/2020   ID:  Janet Mitchell, DOB 1965-10-22, MRN [id number]  Patient Location: Home Provider Location: Home Office  Location of Patient: Home Location of Provider: Telehealth Consent was obtain for visit to be over via telehealth. I verified that I am speaking with the correct person using two identifiers.  PCP:  Lindell Spar, MD   Chief Complaint:  Follow up of chronic conditions  History of Present Illness:    Janet Mitchell is a 54 y.o. female with PMH of HTN and DM with ESRD on HD has a follow up televisit for her chronic medical conditions. She is currently in her HD session.  Her BP at home ranges around 140s/80s. She is compliant with her blood pressure medications. She has been less active and has not been watching over diet. Patient is counseled to follow renal diet - low sodium and low potassium.  She denies any headache, dizziness, chest pain, dyspnea or palpitations.  He complains of bilateral lower extremity edema, but denies any numbness, tingling or weakness in legs.  She gets HD regularly. She states that she is not going to get peritoneal dialysis now since her catheter has been removed due to  concern for peritonitis.  She has been using Toujeo regularly and her last HbA1C at HD center was around 7, which is about the same in 06/2019. Her blood glucose recordings have been between 100-140.  She also complains of chronic diarrhea, for which she is undergoing evaluation with GI.  Questran has been started for likely bile salt diarrhea.  Patient denies any melena, hematochezia or rectal pain.  The patient does not have symptoms concerning for COVID-19 infection (fever, chills, cough, or new shortness of breath).   Past Medical, Surgical, Social History, Allergies, and Medications have been Reviewed.  Past Medical History:  Diagnosis Date  . Anemia   . Arthritis   . CHF (congestive heart failure) ([place])    pt states she has been cleared of heart failure/disease  . Chronic cholecystitis with calculus   . Chronic kidney disease    Phreesia 12/12/2019  . COVID-19 virus infection 03/2019  . ESRD (end stage renal disease) on dialysis [facility])    Dialysis M-W-F  . Headache    "a few/wk" (03/09/2018) - no longer having these  . History of blood transfusion 10/2017   "low blood count" (03/09/2018)  . Hypertension   . Seasonal allergies   . Spinal headache   . Type II diabetes mellitus ([place])    Past Surgical History:  Procedure Laterality Date  . AMPUTATION TOE Left 2013   Great toe  . AV FISTULA PLACEMENT Left 07/22/2019   Procedure: LEFT ARM BASILIC ARTERIOVENOUS (AV) FISTULA CREATION;  Surgeon: Rosetta Posner, MD;  [facility]: [facility];  Service: Vascular;  Laterality: Left;  . BASCILIC VEIN TRANSPOSITION Left 10/17/2019   Procedure: LEFT SECOND STAGE BASCILIC VEIN TRANSPOSITION;  Surgeon: Rosetta Posner, MD;  Location: [place];  Service: Vascular;  Laterality: Left;  . BIOPSY  10/24/2018   Procedure: BIOPSY;  Surgeon: Daneil Dolin, MD;  [facility];  Service: Endoscopy;;  right and left colon  . CATARACT EXTRACTION W/ INTRAOCULAR LENS IMPLANT Right   . CESAREAN SECTION   1994; 1998  . CHOLECYSTECTOMY N/A 03/09/2018   Procedure: LAPAROSCOPIC CHOLECYSTECTOMY WITH INTRAOPERATIVE CHOLANGIOGRAM ERAS PATHWAY;  Surgeon: Donnie Mesa, MD;  Location: [place];  Service: General;  Laterality: N/A;  . COLONOSCOPY N/A 10/24/2018   Procedure: COLONOSCOPY;  Surgeon: Daneil Dolin, MD;  [facility];  Service: Endoscopy;  Laterality: N/A;  1:00pm  . EYE SURGERY Left 05/15/2018   Removal of blood in the globe (due to DM)  . FLEXIBLE SIGMOIDOSCOPY N/A 11/24/2017   Procedure: FLEXIBLE SIGMOIDOSCOPY;  Surgeon: Daneil Dolin, MD;  [facility];  Service: Endoscopy;  Laterality: N/A;  . IR FLUORO GUIDE CV LINE RIGHT  07/17/2019  . IR PARACENTESIS  07/09/2019  . IR US GUIDE VASC ACCESS RIGHT  07/17/2019  . LAPAROSCOPIC CHOLECYSTECTOMY  03/09/2018  . PERITONEAL CATHETER INSERTION  2017  . POLYPECTOMY  10/24/2018   Procedure: POLYPECTOMY;  Surgeon: Daneil Dolin, MD;  [facility];  Service: Endoscopy;;  . TOTAL HIP ARTHROPLASTY Left 1997     Current Meds  Medication Sig  . acetaminophen (TYLENOL) 325 MG tablet Take 650 mg by mouth every 6 (six) hours as needed (pain).   Marland Kitchen amLODipine (NORVASC) 10 MG tablet Take 1 tablet (10 mg total) by mouth daily.  Marland Kitchen aspirin EC 81 MG tablet Take 1 tablet (81 mg total) by mouth daily.  . Blood Glucose Monitoring Suppl (ACCU-CHEK GUIDE) w/Device KIT 1 Piece by Does not apply route as directed.  . calcitRIOL (ROCALTROL) 0.5 MCG capsule Take 1-2 capsules (0.5-1 mcg total) by mouth See admin instructions. 0.38mg on Mondays through Fridays and take 170m on Saturdays and Sundays (Patient taking differently: Take 0.5-1 mcg by mouth See admin instructions. Take 0.62m38mon Mondays through Fridays and take 1mc68mn Saturdays and Sundays)  . cetirizine (ZYRTEC) 10 MG tablet Take 1 tablet (10 mg total) by mouth daily.  . cholestyramine (QUESTRAN) 4 g packet Take 0.5 packets (2 g total) by mouth daily. Do not take within 2 hours  of other medications.  . glMarland Kitchencose blood (ACCU-CHEK GUIDE) test strip Use as instructed  . insulin glargine, 2 Unit Dial, (TOUJEO MAX SOLOSTAR) 300 UNIT/ML Solostar Pen Inject 10 Units into the skin daily. (Patient taking differently: Inject 10 Units into the skin at bedtime. )  . labetalol (NORMODYNE) 300 MG tablet Take 300 mg by mouth 2 (two) times daily.   . loMarland Kitchenartan (COZAAR) 100 MG tablet Take 1 tablet (100 mg total) by mouth daily.  . multivitamin (RENA-VIT) TABS tablet Take 1 tablet by mouth at bedtime.   Current Facility-Administered Medications for the 01/29/20 encounter (Video Visit) with PateLindell Spar  Medication  . ferrous sulfate tablet 325 mg     Allergies:   Patient has no known allergies.   ROS:   ROS:   Please see the history of present illness. Review of Systems  Constitutional: Negative for chills and fever.  HENT: Negative for congestion and sore throat.   Eyes: Negative for  pain and discharge.  Respiratory: Negative for cough and shortness of breath.   Cardiovascular: Negative for chest pain and palpitations.  Gastrointestinal: Positive for diarrhea. Negative for abdominal pain, constipation, nausea and vomiting.  Genitourinary: Negative for dysuria and hematuria.  Neurological: Negative for dizziness and headaches.  Psychiatric/Behavioral: The patient has insomnia. The patient is not nervous/anxious.     Labs/Other Tests and Data Reviewed:    Recent Labs: 07/30/2019: B Natriuretic Peptide 3,228.2 08/29/2019: ALT 15; Magnesium 1.7 10/17/2019: BUN 42; Creatinine, Ser 4.70; Hemoglobin 11.2; Platelets 167; Potassium 4.7; Sodium 138   Recent Lipid Panel Lab Results  Component Value Date/Time   CHOL 114 08/29/2019 03:06 PM   TRIG 91 08/29/2019 03:06 PM   HDL 35 (L) 08/29/2019 03:06 PM   CHOLHDL 3.3 08/29/2019 03:06 PM   LDLCALC 62 08/29/2019 03:06 PM    Wt Readings from Last 3 Encounters:  12/12/19 238 lb (108 kg)  11/21/19 238 lb (108 kg)  11/14/19 239  lb 6.4 oz (108.6 kg)      ASSESSMENT & PLAN:    Hypertension On amlodipine, losartan and labetalol Home BP readings around 140s/80s Counseled for compliance with the medications Advised to comply with renal diet and moderate exercise/walking, at least 150 mins/week Follow-up after 3 weeks in the office, if persistently elevated, plan to add clonidine  Type II DM Well controlled with [place] blood glucose readings between 100-140 Last HbA1c around 7 at the HD center according to the patient Advised the patient to follow low carbohydrate diet and bring the home blood sugar log in the next visit  ESRD on HD Continue HD as scheduled by nephrologist  Diarrhea, chronic Undergoing GI evaluation Questran 2 g once daily  Insomnia Follow simple sleep hygiene Take melatonin as needed in the evening   Time:   Today, I have spent 17 minutes with the patient with telehealth technology discussing the above problems.     Medication Adjustments/Labs and Tests Ordered: Current medicines are reviewed at length with the patient today.  Concerns regarding medicines are outlined above.   Tests Ordered: No orders of the defined types were placed in this encounter.   Medication Changes: No orders of the defined types were placed in this encounter.    Note: This dictation was prepared with Dragon dictation along with smaller phrase technology. Similar sounding words can be transcribed inadequately or may not be corrected upon review. Any transcriptional errors that result from this process are unintentional.      Disposition:  Follow up  Signed, Lindell Spar, MD  01/29/2020 1:28 PM     [employer]

## 2020-01-29 NOTE — Patient Instructions (Signed)
Please continue to take Amlodipine, Losartan and Labetalol as prescribed for now.  Please follow renal diet - low sodium and low potassium containing food as directed by your Nephrologist. Please continue to get HD as scheduled.  Please continue to take Toujeo as prescribed. Please check blood glucose at home and bring the log in the next visit.  Please take Melatonin early in the evening to avoid daytime sleepiness.  Please bring the blood test results in the next visit.

## 2020-01-31 DIAGNOSIS — D631 Anemia in chronic kidney disease: Secondary | ICD-10-CM | POA: Diagnosis not present

## 2020-01-31 DIAGNOSIS — N186 End stage renal disease: Secondary | ICD-10-CM | POA: Diagnosis not present

## 2020-01-31 DIAGNOSIS — D509 Iron deficiency anemia, unspecified: Secondary | ICD-10-CM | POA: Diagnosis not present

## 2020-01-31 DIAGNOSIS — Z992 Dependence on renal dialysis: Secondary | ICD-10-CM | POA: Diagnosis not present

## 2020-01-31 DIAGNOSIS — N2581 Secondary hyperparathyroidism of renal origin: Secondary | ICD-10-CM | POA: Diagnosis not present

## 2020-02-03 DIAGNOSIS — N2581 Secondary hyperparathyroidism of renal origin: Secondary | ICD-10-CM | POA: Diagnosis not present

## 2020-02-03 DIAGNOSIS — N186 End stage renal disease: Secondary | ICD-10-CM | POA: Diagnosis not present

## 2020-02-03 DIAGNOSIS — Z992 Dependence on renal dialysis: Secondary | ICD-10-CM | POA: Diagnosis not present

## 2020-02-03 DIAGNOSIS — D631 Anemia in chronic kidney disease: Secondary | ICD-10-CM | POA: Diagnosis not present

## 2020-02-03 DIAGNOSIS — D509 Iron deficiency anemia, unspecified: Secondary | ICD-10-CM | POA: Diagnosis not present

## 2020-02-05 DIAGNOSIS — D509 Iron deficiency anemia, unspecified: Secondary | ICD-10-CM | POA: Diagnosis not present

## 2020-02-05 DIAGNOSIS — D631 Anemia in chronic kidney disease: Secondary | ICD-10-CM | POA: Diagnosis not present

## 2020-02-05 DIAGNOSIS — N2581 Secondary hyperparathyroidism of renal origin: Secondary | ICD-10-CM | POA: Diagnosis not present

## 2020-02-05 DIAGNOSIS — N186 End stage renal disease: Secondary | ICD-10-CM | POA: Diagnosis not present

## 2020-02-05 DIAGNOSIS — Z992 Dependence on renal dialysis: Secondary | ICD-10-CM | POA: Diagnosis not present

## 2020-02-07 DIAGNOSIS — Z992 Dependence on renal dialysis: Secondary | ICD-10-CM | POA: Diagnosis not present

## 2020-02-07 DIAGNOSIS — D631 Anemia in chronic kidney disease: Secondary | ICD-10-CM | POA: Diagnosis not present

## 2020-02-07 DIAGNOSIS — N186 End stage renal disease: Secondary | ICD-10-CM | POA: Diagnosis not present

## 2020-02-07 DIAGNOSIS — N2581 Secondary hyperparathyroidism of renal origin: Secondary | ICD-10-CM | POA: Diagnosis not present

## 2020-02-07 DIAGNOSIS — D509 Iron deficiency anemia, unspecified: Secondary | ICD-10-CM | POA: Diagnosis not present

## 2020-02-10 DIAGNOSIS — D631 Anemia in chronic kidney disease: Secondary | ICD-10-CM | POA: Diagnosis not present

## 2020-02-10 DIAGNOSIS — N186 End stage renal disease: Secondary | ICD-10-CM | POA: Diagnosis not present

## 2020-02-10 DIAGNOSIS — D509 Iron deficiency anemia, unspecified: Secondary | ICD-10-CM | POA: Diagnosis not present

## 2020-02-10 DIAGNOSIS — N2581 Secondary hyperparathyroidism of renal origin: Secondary | ICD-10-CM | POA: Diagnosis not present

## 2020-02-10 DIAGNOSIS — Z992 Dependence on renal dialysis: Secondary | ICD-10-CM | POA: Diagnosis not present

## 2020-02-12 DIAGNOSIS — N186 End stage renal disease: Secondary | ICD-10-CM | POA: Diagnosis not present

## 2020-02-12 DIAGNOSIS — D509 Iron deficiency anemia, unspecified: Secondary | ICD-10-CM | POA: Diagnosis not present

## 2020-02-12 DIAGNOSIS — N2581 Secondary hyperparathyroidism of renal origin: Secondary | ICD-10-CM | POA: Diagnosis not present

## 2020-02-12 DIAGNOSIS — Z992 Dependence on renal dialysis: Secondary | ICD-10-CM | POA: Diagnosis not present

## 2020-02-12 DIAGNOSIS — D631 Anemia in chronic kidney disease: Secondary | ICD-10-CM | POA: Diagnosis not present

## 2020-02-14 DIAGNOSIS — N186 End stage renal disease: Secondary | ICD-10-CM | POA: Diagnosis not present

## 2020-02-14 DIAGNOSIS — D509 Iron deficiency anemia, unspecified: Secondary | ICD-10-CM | POA: Diagnosis not present

## 2020-02-14 DIAGNOSIS — N2581 Secondary hyperparathyroidism of renal origin: Secondary | ICD-10-CM | POA: Diagnosis not present

## 2020-02-14 DIAGNOSIS — D631 Anemia in chronic kidney disease: Secondary | ICD-10-CM | POA: Diagnosis not present

## 2020-02-14 DIAGNOSIS — Z992 Dependence on renal dialysis: Secondary | ICD-10-CM | POA: Diagnosis not present

## 2020-02-17 DIAGNOSIS — D631 Anemia in chronic kidney disease: Secondary | ICD-10-CM | POA: Diagnosis not present

## 2020-02-17 DIAGNOSIS — D509 Iron deficiency anemia, unspecified: Secondary | ICD-10-CM | POA: Diagnosis not present

## 2020-02-17 DIAGNOSIS — N2581 Secondary hyperparathyroidism of renal origin: Secondary | ICD-10-CM | POA: Diagnosis not present

## 2020-02-17 DIAGNOSIS — N186 End stage renal disease: Secondary | ICD-10-CM | POA: Diagnosis not present

## 2020-02-17 DIAGNOSIS — Z992 Dependence on renal dialysis: Secondary | ICD-10-CM | POA: Diagnosis not present

## 2020-02-18 ENCOUNTER — Encounter: Payer: Self-pay | Admitting: Internal Medicine

## 2020-02-18 ENCOUNTER — Ambulatory Visit: Payer: Medicare Other | Admitting: Internal Medicine

## 2020-02-18 ENCOUNTER — Ambulatory Visit (INDEPENDENT_AMBULATORY_CARE_PROVIDER_SITE_OTHER): Payer: Medicare Other | Admitting: Internal Medicine

## 2020-02-18 ENCOUNTER — Other Ambulatory Visit: Payer: Self-pay

## 2020-02-18 VITALS — BP 158/95 | HR 68 | Temp 97.4°F | Resp 18 | Ht 68.0 in | Wt 228.0 lb

## 2020-02-18 DIAGNOSIS — I10 Essential (primary) hypertension: Secondary | ICD-10-CM | POA: Diagnosis not present

## 2020-02-18 DIAGNOSIS — E1122 Type 2 diabetes mellitus with diabetic chronic kidney disease: Secondary | ICD-10-CM | POA: Diagnosis not present

## 2020-02-18 DIAGNOSIS — N186 End stage renal disease: Secondary | ICD-10-CM

## 2020-02-18 MED ORDER — CLONIDINE HCL 0.1 MG PO TABS: 0.1000 mg | ORAL_TABLET | Freq: Two times a day (BID) | ORAL | 1 refills | Status: DC

## 2020-02-18 NOTE — Progress Notes (Signed)
Established Patient Office Visit  Subjective:  Patient ID: Janet Mitchell, female    DOB: 22-Apr-1965  Age: 54 y.o. MRN: [id number]  CC:  Chief Complaint  Patient presents with  . Hypertension    HPI Janet Mitchell  is a 54 y.o. female with PMH of HTN and DM with ESRD on HD presents for follow-up for her hypertension.  Her blood pressure was 158/95 in the office today.  She reports noticing high blood pressure at home, and reports blood pressure readings between 150- 170/80-100.  She also reports generalized headache due to high blood pressure.  She denies any dizziness, chest pain, dyspnea or palpitations.  She has brought her lab test results from HD center. She states that she is put back on kidney transplant list at [facility].  Past Medical History:  Diagnosis Date  . Anemia   . Arthritis   . CHF (congestive heart failure) ([place])    pt states she has been cleared of heart failure/disease  . Chronic cholecystitis with calculus   . Chronic kidney disease    Phreesia 12/12/2019  . COVID-19 virus infection 03/2019  . ESRD (end stage renal disease) on dialysis [facility])    Dialysis M-W-F  . Headache    "a few/wk" (03/09/2018) - no longer having these  . History of blood transfusion 10/2017   "low blood count" (03/09/2018)  . Hypertension   . Seasonal allergies   . Spinal headache   . Type II diabetes mellitus ([place])     Past Surgical History:  Procedure Laterality Date  . AMPUTATION TOE Left 2013   Great toe  . AV FISTULA PLACEMENT Left 07/22/2019   Procedure: LEFT ARM BASILIC ARTERIOVENOUS (AV) FISTULA CREATION;  Surgeon: Rosetta Posner, MD;  Location: [facility];  Service: Vascular;  Laterality: Left;  . BASCILIC VEIN TRANSPOSITION Left 10/17/2019   Procedure: LEFT SECOND STAGE BASCILIC VEIN TRANSPOSITION;  Surgeon: Rosetta Posner, MD;  Location: [place];  Service: Vascular;  Laterality: Left;  . BIOPSY  10/24/2018   Procedure: BIOPSY;  Surgeon: Daneil Dolin, MD;   [facility];  Service: Endoscopy;;  right and left colon  . CATARACT EXTRACTION W/ INTRAOCULAR LENS IMPLANT Right   . CESAREAN SECTION  1994; 1998  . CHOLECYSTECTOMY N/A 03/09/2018   Procedure: LAPAROSCOPIC CHOLECYSTECTOMY WITH INTRAOPERATIVE CHOLANGIOGRAM ERAS PATHWAY;  Surgeon: Donnie Mesa, MD;  Location: [place];  Service: General;  Laterality: N/A;  . COLONOSCOPY N/A 10/24/2018   Procedure: COLONOSCOPY;  Surgeon: Daneil Dolin, MD;  [facility];  Service: Endoscopy;  Laterality: N/A;  1:00pm  . EYE SURGERY Left 05/15/2018   Removal of blood in the globe (due to DM)  . FLEXIBLE SIGMOIDOSCOPY N/A 11/24/2017   Procedure: FLEXIBLE SIGMOIDOSCOPY;  Surgeon: Daneil Dolin, MD;  [facility];  Service: Endoscopy;  Laterality: N/A;  . IR FLUORO GUIDE CV LINE RIGHT  07/17/2019  . IR PARACENTESIS  07/09/2019  . IR US GUIDE VASC ACCESS RIGHT  07/17/2019  . LAPAROSCOPIC CHOLECYSTECTOMY  03/09/2018  . PERITONEAL CATHETER INSERTION  2017  . POLYPECTOMY  10/24/2018   Procedure: POLYPECTOMY;  Surgeon: Daneil Dolin, MD;  [facility];  Service: Endoscopy;;  . TOTAL HIP ARTHROPLASTY Left 1997    Family History  Problem Relation Age of Onset  . Heart disease Mother   . Thrombocytopenia Mother        TTP  . Heart failure Father   .  Kidney disease Paternal Grandfather   . Colon cancer Neg Hx     Social History   Socioeconomic History  . Marital status: Married    Spouse name: Not on file  . Number of children: 2  . Years of education: Not on file  . Highest education level: Not on file  Occupational History  . Not on file  Tobacco Use  . Smoking status: Passive Smoke Exposure - Never Smoker  . Smokeless tobacco: Never Used  Vaping Use  . Vaping Use: Never used  Substance and Sexual Activity  . Alcohol use: Never  . Drug use: Never  . Sexual activity: Yes    Birth control/protection: I.U.D.    Comment: Mirena IUD  Other Topics Concern   . Not on file  Social History Narrative  . Not on file   Social Determinants of Health   Financial Resource Strain:   . Difficulty of Paying Living Expenses: Not on file  Food Insecurity:   . Worried About Charity fundraiser in the Last Year: Not on file  . Ran Out of Food in the Last Year: Not on file  Transportation Needs:   . Lack of Transportation (Medical): Not on file  . Lack of Transportation (Non-Medical): Not on file  Physical Activity:   . Days of Exercise per Week: Not on file  . Minutes of Exercise per Session: Not on file  Stress:   . Feeling of Stress : Not on file  Social Connections:   . Frequency of Communication with Friends and Family: Not on file  . Frequency of Social Gatherings with Friends and Family: Not on file  . Attends Religious Services: Not on file  . Active Member of Clubs or Organizations: Not on file  . Attends Archivist Meetings: Not on file  . Marital Status: Not on file  Intimate Partner Violence:   . Fear of Current or Ex-Partner: Not on file  . Emotionally Abused: Not on file  . Physically Abused: Not on file  . Sexually Abused: Not on file    Outpatient Medications Prior to Visit  Medication Sig Dispense Refill  . acetaminophen (TYLENOL) 325 MG tablet Take 650 mg by mouth every 6 (six) hours as needed (pain).     Marland Kitchen amLODipine (NORVASC) 10 MG tablet Take 1 tablet (10 mg total) by mouth daily. 90 tablet 3  . aspirin EC 81 MG tablet Take 1 tablet (81 mg total) by mouth daily.    . Blood Glucose Monitoring Suppl (ACCU-CHEK GUIDE) w/Device KIT 1 Piece by Does not apply route as directed. 1 kit 0  . calcitRIOL (ROCALTROL) 0.5 MCG capsule Take 1-2 capsules (0.5-1 mcg total) by mouth See admin instructions. 0.31mg on Mondays through Fridays and take 138m on Saturdays and Sundays (Patient taking differently: Take 0.5-1 mcg by mouth See admin instructions. Take 0.25m825mon Mondays through Fridays and take 1mc58mn Saturdays and Sundays)  90 capsule 1  . cetirizine (ZYRTEC) 10 MG tablet Take 1 tablet (10 mg total) by mouth daily. 30 tablet 1  . cholestyramine (QUESTRAN) 4 g packet Take 0.5 packets (2 g total) by mouth daily. Do not take within 2 hours of other medications. 30 each 3  . glucose blood (ACCU-CHEK GUIDE) test strip Use as instructed 150 each 2  . insulin glargine, 2 Unit Dial, (TOUJEO MAX SOLOSTAR) 300 UNIT/ML Solostar Pen Inject 10 Units into the skin daily. (Patient taking differently: Inject 10 Units into the skin at  bedtime. )    . labetalol (NORMODYNE) 300 MG tablet Take 300 mg by mouth 2 (two) times daily.     Marland Kitchen losartan (COZAAR) 100 MG tablet Take 1 tablet (100 mg total) by mouth daily. 90 tablet 3  . multivitamin (RENA-VIT) TABS tablet Take 1 tablet by mouth at bedtime. 30 tablet 0   Facility-Administered Medications Prior to Visit  Medication Dose Route Frequency Provider Last Rate Last Admin  . ferrous sulfate tablet 325 mg  325 mg Oral BID WC Pahwani, Rinka R, MD        No Known Allergies  ROS Review of Systems  Constitutional: Positive for fatigue. Negative for chills and fever.  HENT: Negative for congestion, sinus pressure, sinus pain and sore throat.   Eyes: Negative for pain and discharge.  Respiratory: Negative for cough and shortness of breath.   Cardiovascular: Negative for chest pain and palpitations.  Gastrointestinal: Negative for abdominal pain, constipation, diarrhea, nausea and vomiting.  Endocrine: Negative for polydipsia and polyuria.  Genitourinary: Negative for dysuria and hematuria.  Musculoskeletal: Negative for neck pain and neck stiffness.  Skin: Negative for rash.  Neurological: Negative for dizziness and weakness.  Psychiatric/Behavioral: Negative for agitation and behavioral problems.      Objective:    Physical Exam Vitals reviewed.  Constitutional:      General: She is not in acute distress.    Appearance: She is not diaphoretic.  HENT:     Head: Normocephalic  and atraumatic.     Nose: Nose normal. No congestion.     Mouth/Throat:     Mouth: Mucous membranes are moist.     Pharynx: No posterior oropharyngeal erythema.  Eyes:     General: No scleral icterus.    Extraocular Movements: Extraocular movements intact.     Pupils: Pupils are equal, round, and reactive to light.  Cardiovascular:     Rate and Rhythm: Normal rate and regular rhythm.     Pulses: Normal pulses.     Heart sounds: Normal heart sounds. No murmur heard.   Pulmonary:     Breath sounds: Normal breath sounds. No wheezing or rales.  Abdominal:     Palpations: Abdomen is soft.     Tenderness: There is no abdominal tenderness.  Musculoskeletal:     Cervical back: Neck supple. No tenderness.     Right lower leg: No edema.     Left lower leg: No edema.  Skin:    General: Skin is warm.     Findings: No rash.  Neurological:     General: No focal deficit present.     Mental Status: She is alert and oriented to person, place, and time.  Psychiatric:        Mood and Affect: Mood normal.        Behavior: Behavior normal.     BP (!) 158/95   Pulse 68   Temp (!) 97.4 F (36.3 C)   Resp 18   Ht '5\' 8"'  (1.727 m)   Wt 228 lb (103.4 kg)   SpO2 95%   BMI 34.67 kg/m  Wt Readings from Last 3 Encounters:  02/18/20 228 lb (103.4 kg)  12/12/19 238 lb (108 kg)  11/21/19 238 lb (108 kg)     Health Maintenance Due  Topic Date Due  . Hepatitis C Screening  Never done  . PNEUMOCOCCAL POLYSACCHARIDE VACCINE AGE 29-64 HIGH RISK  Never done  . FOOT EXAM  Never done  . OPHTHALMOLOGY EXAM  Never done  .  TETANUS/TDAP  Never done  . MAMMOGRAM  Never done  . HEMOGLOBIN A1C  01/07/2020    There are no preventive care reminders to display for this patient.  No results found for: TSH Lab Results  Component Value Date   WBC 4.8 10/17/2019   HGB 11.2 (L) 10/17/2019   HCT 33.0 (L) 10/17/2019   MCV 77.0 (L) 10/17/2019   PLT 167 10/17/2019   Lab Results  Component Value Date    NA 138 10/17/2019   K 4.7 10/17/2019   CO2 30 08/29/2019   GLUCOSE 221 (H) 10/17/2019   BUN 42 (H) 10/17/2019   CREATININE 4.70 (H) 10/17/2019   BILITOT 0.5 08/29/2019   ALKPHOS 63 07/30/2019   AST 16 08/29/2019   ALT 15 08/29/2019   PROT 8.1 08/29/2019   ALBUMIN 2.4 (L) 07/30/2019   CALCIUM 9.2 08/29/2019   ANIONGAP 11 07/30/2019   Lab Results  Component Value Date   CHOL 114 08/29/2019   Lab Results  Component Value Date   HDL 35 (L) 08/29/2019   Lab Results  Component Value Date   LDLCALC 62 08/29/2019   Lab Results  Component Value Date   TRIG 91 08/29/2019   Lab Results  Component Value Date   CHOLHDL 3.3 08/29/2019   Lab Results  Component Value Date   HGBA1C 7.2 (H) 07/08/2019      Assessment & Plan:   Problem List Items Addressed This Visit      Cardiovascular and Mediastinum   Essential hypertension    BP: 158/95, at home also high, around 150-170/80-100 On Amlodipine, Losartan and Labetalol Added Clonidine 0.1 mg BID Advised to increase activity DASH diet advised, weight loss would be beneficial Patient expressed understanding      Relevant Medications   cloNIDine (CATAPRES) 0.1 MG tablet     Endocrine   Type 2 diabetes mellitus with ESRD (end-stage renal disease) ([place])    HbA1C: 6.9 Labs reviewed from HD center Uses Toujeo at nighttime Follows up with Nephrologist for ESRD on HD On kidney transplant list at [facility]        Other Visit Diagnoses    Malignant hypertension    -  Primary   Relevant Medications   cloNIDine (CATAPRES) 0.1 MG tablet      Meds ordered this encounter  Medications  . cloNIDine (CATAPRES) 0.1 MG tablet    Sig: Take 1 tablet (0.1 mg total) by mouth 2 (two) times daily.    Dispense:  90 tablet    Refill:  1    Follow-up: Return in about 3 months (around 05/20/2020).    Lindell Spar, MD

## 2020-02-18 NOTE — Assessment & Plan Note (Signed)
HbA1C: 6.9 Labs reviewed from HD center Uses Toujeo at nighttime Follows up with Nephrologist for ESRD on HD On kidney transplant list at [facility]

## 2020-02-18 NOTE — Patient Instructions (Signed)
Please start taking Clonidine as prescribed.  Please continue to follow renal diet as advised by your nutritionist.  Please continue to check BP at home and bring the log in the next visit.  Please continue to get dialysis as scheduled.

## 2020-02-18 NOTE — Assessment & Plan Note (Signed)
BP: 158/95, at home also high, around 150-170/80-100 On Amlodipine, Losartan and Labetalol Added Clonidine 0.1 mg BID Advised to increase activity DASH diet advised, weight loss would be beneficial Patient expressed understanding

## 2020-02-19 DIAGNOSIS — Z992 Dependence on renal dialysis: Secondary | ICD-10-CM | POA: Diagnosis not present

## 2020-02-19 DIAGNOSIS — N186 End stage renal disease: Secondary | ICD-10-CM | POA: Diagnosis not present

## 2020-02-19 DIAGNOSIS — N2581 Secondary hyperparathyroidism of renal origin: Secondary | ICD-10-CM | POA: Diagnosis not present

## 2020-02-19 DIAGNOSIS — D509 Iron deficiency anemia, unspecified: Secondary | ICD-10-CM | POA: Diagnosis not present

## 2020-02-19 DIAGNOSIS — D631 Anemia in chronic kidney disease: Secondary | ICD-10-CM | POA: Diagnosis not present

## 2020-02-21 DIAGNOSIS — D509 Iron deficiency anemia, unspecified: Secondary | ICD-10-CM | POA: Diagnosis not present

## 2020-02-21 DIAGNOSIS — Z992 Dependence on renal dialysis: Secondary | ICD-10-CM | POA: Diagnosis not present

## 2020-02-21 DIAGNOSIS — N186 End stage renal disease: Secondary | ICD-10-CM | POA: Diagnosis not present

## 2020-02-21 DIAGNOSIS — N2581 Secondary hyperparathyroidism of renal origin: Secondary | ICD-10-CM | POA: Diagnosis not present

## 2020-02-21 DIAGNOSIS — D631 Anemia in chronic kidney disease: Secondary | ICD-10-CM | POA: Diagnosis not present

## 2020-02-24 DIAGNOSIS — N186 End stage renal disease: Secondary | ICD-10-CM | POA: Diagnosis not present

## 2020-02-24 DIAGNOSIS — Z992 Dependence on renal dialysis: Secondary | ICD-10-CM | POA: Diagnosis not present

## 2020-02-24 DIAGNOSIS — N2581 Secondary hyperparathyroidism of renal origin: Secondary | ICD-10-CM | POA: Diagnosis not present

## 2020-02-24 DIAGNOSIS — D509 Iron deficiency anemia, unspecified: Secondary | ICD-10-CM | POA: Diagnosis not present

## 2020-02-24 DIAGNOSIS — D631 Anemia in chronic kidney disease: Secondary | ICD-10-CM | POA: Diagnosis not present

## 2020-02-25 DIAGNOSIS — Z992 Dependence on renal dialysis: Secondary | ICD-10-CM | POA: Diagnosis not present

## 2020-02-25 DIAGNOSIS — N186 End stage renal disease: Secondary | ICD-10-CM | POA: Diagnosis not present

## 2020-02-26 DIAGNOSIS — D631 Anemia in chronic kidney disease: Secondary | ICD-10-CM | POA: Diagnosis not present

## 2020-02-26 DIAGNOSIS — N2581 Secondary hyperparathyroidism of renal origin: Secondary | ICD-10-CM | POA: Diagnosis not present

## 2020-02-26 DIAGNOSIS — Z992 Dependence on renal dialysis: Secondary | ICD-10-CM | POA: Diagnosis not present

## 2020-02-26 DIAGNOSIS — D509 Iron deficiency anemia, unspecified: Secondary | ICD-10-CM | POA: Diagnosis not present

## 2020-02-26 DIAGNOSIS — N186 End stage renal disease: Secondary | ICD-10-CM | POA: Diagnosis not present

## 2020-02-27 ENCOUNTER — Ambulatory Visit: Payer: Medicare Other

## 2020-02-28 DIAGNOSIS — D631 Anemia in chronic kidney disease: Secondary | ICD-10-CM | POA: Diagnosis not present

## 2020-02-28 DIAGNOSIS — D509 Iron deficiency anemia, unspecified: Secondary | ICD-10-CM | POA: Diagnosis not present

## 2020-02-28 DIAGNOSIS — N186 End stage renal disease: Secondary | ICD-10-CM | POA: Diagnosis not present

## 2020-02-28 DIAGNOSIS — N2581 Secondary hyperparathyroidism of renal origin: Secondary | ICD-10-CM | POA: Diagnosis not present

## 2020-02-28 DIAGNOSIS — Z992 Dependence on renal dialysis: Secondary | ICD-10-CM | POA: Diagnosis not present

## 2020-03-02 DIAGNOSIS — D631 Anemia in chronic kidney disease: Secondary | ICD-10-CM | POA: Diagnosis not present

## 2020-03-02 DIAGNOSIS — N2581 Secondary hyperparathyroidism of renal origin: Secondary | ICD-10-CM | POA: Diagnosis not present

## 2020-03-02 DIAGNOSIS — N186 End stage renal disease: Secondary | ICD-10-CM | POA: Diagnosis not present

## 2020-03-02 DIAGNOSIS — D509 Iron deficiency anemia, unspecified: Secondary | ICD-10-CM | POA: Diagnosis not present

## 2020-03-02 DIAGNOSIS — Z992 Dependence on renal dialysis: Secondary | ICD-10-CM | POA: Diagnosis not present

## 2020-03-03 ENCOUNTER — Other Ambulatory Visit: Payer: Self-pay

## 2020-03-03 ENCOUNTER — Ambulatory Visit (INDEPENDENT_AMBULATORY_CARE_PROVIDER_SITE_OTHER): Payer: Medicare Other | Admitting: Physician Assistant

## 2020-03-03 VITALS — BP 160/89 | HR 67 | Temp 98.2°F | Resp 20 | Ht 68.0 in | Wt 230.5 lb

## 2020-03-03 DIAGNOSIS — N186 End stage renal disease: Secondary | ICD-10-CM | POA: Diagnosis not present

## 2020-03-03 NOTE — Progress Notes (Signed)
Established Dialysis Access   History of Present Illness   Janet Mitchell is a 54 y.o. (09/11/1965) female who presents for follow up of her left second stage basilic vein transposition. This was performed by Dr. Donnetta Hutching 10/17/19. She was last seen at time of her post op visit in August. She was having some swelling and a little burning pain in the left upper arm. She was found to have a hematoma. She had no steal symptoms at the time but was advised for follow up in 3 weeks to recheck her arm for resolution. However she has missed several appointments.  She presents today for follow up of her left arm BV fistula. Hematoma is resolved. She is not having any left upper extremity pain. She does have some numbness in her left 5th finger but otherwise no numbness, tingling or pain in left arm or hand.  She currently dialyzes MWF at [facility] in [place] via right IJ TDC. She expresses concerns about using her fistula due to the needles, bleeding, and other complications that she has seen others experience at dialysis. Her catheter is currently working well and she has felt the most comfortable using this.  The patient's PMH, PSH, SH, and FamHx were reviewed and are unchanged from prior visit in August 2021.  Current Outpatient Medications  Medication Sig Dispense Refill  . acetaminophen (TYLENOL) 325 MG tablet Take 650 mg by mouth every 6 (six) hours as needed (pain).     Marland Kitchen amLODipine (NORVASC) 10 MG tablet Take 1 tablet (10 mg total) by mouth daily. 90 tablet 3  . aspirin EC 81 MG tablet Take 1 tablet (81 mg total) by mouth daily.    . B Complex-C-Folic Acid (RENA-VITE PO) Take 1 tablet by mouth daily.    . Blood Glucose Monitoring Suppl (ACCU-CHEK GUIDE) w/Device KIT 1 Piece by Does not apply route as directed. 1 kit 0  . calcitRIOL (ROCALTROL) 0.5 MCG capsule Take 1-2 capsules (0.5-1 mcg total) by mouth See admin instructions. 0.94mg on Mondays through Fridays and take 130m on Saturdays and  Sundays (Patient taking differently: Take 0.5-1 mcg by mouth See admin instructions. Take 0.78m79mon Mondays through Fridays and take 1mc26mn Saturdays and Sundays) 90 capsule 1  . calcium acetate (PHOSLO) 667 MG capsule Take by mouth.    . cetirizine (ZYRTEC) 10 MG tablet Take 1 tablet (10 mg total) by mouth daily. 30 tablet 1  . cholestyramine (QUESTRAN) 4 g packet Take 0.5 packets (2 g total) by mouth daily. Do not take within 2 hours of other medications. 30 each 3  . cloNIDine (CATAPRES) 0.1 MG tablet Take 1 tablet (0.1 mg total) by mouth 2 (two) times daily. 90 tablet 1  . glucose blood (ACCU-CHEK GUIDE) test strip Use as instructed 150 each 2  . insulin glargine, 2 Unit Dial, (TOUJEO MAX SOLOSTAR) 300 UNIT/ML Solostar Pen Inject 10 Units into the skin daily. (Patient taking differently: Inject 10 Units into the skin at bedtime. )    . labetalol (NORMODYNE) 300 MG tablet Take 300 mg by mouth 2 (two) times daily.     . loMarland Kitchenartan (COZAAR) 100 MG tablet Take 1 tablet (100 mg total) by mouth daily. 90 tablet 3  . psyllium (METAMUCIL) 58.6 % packet Take by mouth.     Current Facility-Administered Medications  Medication Dose Route Frequency Provider Last Rate Last Admin  . ferrous sulfate tablet 325 mg  325 mg Oral BID WC Pahwani, RinkMichell Heinrich  On ROS today: negative unless stated in HPI   Physical Examination   Vitals:   03/03/20 1251  BP: (!) 160/89  Pulse: 67  Resp: 20  Temp: 98.2 F (36.8 C)  TempSrc: Temporal  SpO2: 97%  Weight: 230 lb 8 oz (104.6 kg)  Height: '5\' 8"'  (1.727 m)   Body mass index is 35.05 kg/m.  General WDWN, pleasant female, vital signs as documented above  Pulmonary Non labored  Cardiac Regular rate and rhythm  Vascular   Musculo- skeletal Left upper extremity AV fistula with good thrill. M/S 5/5 throughout  , Extremities without ischemic changes    Neurologic A&O; CN grossly intact     Medical Decision Making   [name] is a 54 y.o.  female who presents with for follow up of LUE AV fistula. She had her second stage fistula in July of 75170 by Dr. Donnetta Hutching. Post operatively she had a hematoma which subsequently resolved. She has been dialyzing via right IJ TDC. She has refrained from using her left upper arm fistula due to some concerns about risks in using it. I tried to offer some reassurance of the process of dialysis and I was also honest with her about the risks of continuing to use her [facility] as well as risks that can happen with fistula. I have encouraged her to start using her fistula so that she can have her TDC removed, but told her it is ultimately her decision.   Patent without signs or symptoms of steal syndrome  The patient's access is ready to use immediately  The patient's tunneled dialysis catheter can be removed when Nephrology is comfortable with the performance of the left AV fistula  The patient may follow up on a prn basis   Karoline Caldwell, PA-C [facility] Office: [phone]  Clinic MD: Dr. Stanford Breed

## 2020-03-04 DIAGNOSIS — N2581 Secondary hyperparathyroidism of renal origin: Secondary | ICD-10-CM | POA: Diagnosis not present

## 2020-03-04 DIAGNOSIS — D509 Iron deficiency anemia, unspecified: Secondary | ICD-10-CM | POA: Diagnosis not present

## 2020-03-04 DIAGNOSIS — D631 Anemia in chronic kidney disease: Secondary | ICD-10-CM | POA: Diagnosis not present

## 2020-03-04 DIAGNOSIS — Z992 Dependence on renal dialysis: Secondary | ICD-10-CM | POA: Diagnosis not present

## 2020-03-04 DIAGNOSIS — N186 End stage renal disease: Secondary | ICD-10-CM | POA: Diagnosis not present

## 2020-03-06 DIAGNOSIS — Z992 Dependence on renal dialysis: Secondary | ICD-10-CM | POA: Diagnosis not present

## 2020-03-06 DIAGNOSIS — N2581 Secondary hyperparathyroidism of renal origin: Secondary | ICD-10-CM | POA: Diagnosis not present

## 2020-03-06 DIAGNOSIS — D509 Iron deficiency anemia, unspecified: Secondary | ICD-10-CM | POA: Diagnosis not present

## 2020-03-06 DIAGNOSIS — N186 End stage renal disease: Secondary | ICD-10-CM | POA: Diagnosis not present

## 2020-03-06 DIAGNOSIS — D631 Anemia in chronic kidney disease: Secondary | ICD-10-CM | POA: Diagnosis not present

## 2020-03-09 DIAGNOSIS — N186 End stage renal disease: Secondary | ICD-10-CM | POA: Diagnosis not present

## 2020-03-09 DIAGNOSIS — D509 Iron deficiency anemia, unspecified: Secondary | ICD-10-CM | POA: Diagnosis not present

## 2020-03-09 DIAGNOSIS — D631 Anemia in chronic kidney disease: Secondary | ICD-10-CM | POA: Diagnosis not present

## 2020-03-09 DIAGNOSIS — Z992 Dependence on renal dialysis: Secondary | ICD-10-CM | POA: Diagnosis not present

## 2020-03-09 DIAGNOSIS — N2581 Secondary hyperparathyroidism of renal origin: Secondary | ICD-10-CM | POA: Diagnosis not present

## 2020-03-11 DIAGNOSIS — N2581 Secondary hyperparathyroidism of renal origin: Secondary | ICD-10-CM | POA: Diagnosis not present

## 2020-03-11 DIAGNOSIS — D509 Iron deficiency anemia, unspecified: Secondary | ICD-10-CM | POA: Diagnosis not present

## 2020-03-11 DIAGNOSIS — Z992 Dependence on renal dialysis: Secondary | ICD-10-CM | POA: Diagnosis not present

## 2020-03-11 DIAGNOSIS — D631 Anemia in chronic kidney disease: Secondary | ICD-10-CM | POA: Diagnosis not present

## 2020-03-11 DIAGNOSIS — N186 End stage renal disease: Secondary | ICD-10-CM | POA: Diagnosis not present

## 2020-03-13 DIAGNOSIS — Z992 Dependence on renal dialysis: Secondary | ICD-10-CM | POA: Diagnosis not present

## 2020-03-13 DIAGNOSIS — N2581 Secondary hyperparathyroidism of renal origin: Secondary | ICD-10-CM | POA: Diagnosis not present

## 2020-03-13 DIAGNOSIS — D509 Iron deficiency anemia, unspecified: Secondary | ICD-10-CM | POA: Diagnosis not present

## 2020-03-13 DIAGNOSIS — N186 End stage renal disease: Secondary | ICD-10-CM | POA: Diagnosis not present

## 2020-03-13 DIAGNOSIS — D631 Anemia in chronic kidney disease: Secondary | ICD-10-CM | POA: Diagnosis not present

## 2020-03-16 DIAGNOSIS — R519 Headache, unspecified: Secondary | ICD-10-CM | POA: Diagnosis not present

## 2020-03-16 DIAGNOSIS — R0989 Other specified symptoms and signs involving the circulatory and respiratory systems: Secondary | ICD-10-CM | POA: Diagnosis not present

## 2020-03-16 DIAGNOSIS — R5383 Other fatigue: Secondary | ICD-10-CM | POA: Diagnosis not present

## 2020-03-16 DIAGNOSIS — J029 Acute pharyngitis, unspecified: Secondary | ICD-10-CM | POA: Diagnosis not present

## 2020-03-16 DIAGNOSIS — D509 Iron deficiency anemia, unspecified: Secondary | ICD-10-CM | POA: Diagnosis not present

## 2020-03-16 DIAGNOSIS — Z992 Dependence on renal dialysis: Secondary | ICD-10-CM | POA: Diagnosis not present

## 2020-03-16 DIAGNOSIS — N186 End stage renal disease: Secondary | ICD-10-CM | POA: Diagnosis not present

## 2020-03-16 DIAGNOSIS — N2581 Secondary hyperparathyroidism of renal origin: Secondary | ICD-10-CM | POA: Diagnosis not present

## 2020-03-16 DIAGNOSIS — D631 Anemia in chronic kidney disease: Secondary | ICD-10-CM | POA: Diagnosis not present

## 2020-03-18 DIAGNOSIS — N2581 Secondary hyperparathyroidism of renal origin: Secondary | ICD-10-CM | POA: Diagnosis not present

## 2020-03-18 DIAGNOSIS — D631 Anemia in chronic kidney disease: Secondary | ICD-10-CM | POA: Diagnosis not present

## 2020-03-18 DIAGNOSIS — D509 Iron deficiency anemia, unspecified: Secondary | ICD-10-CM | POA: Diagnosis not present

## 2020-03-18 DIAGNOSIS — N186 End stage renal disease: Secondary | ICD-10-CM | POA: Diagnosis not present

## 2020-03-18 DIAGNOSIS — Z992 Dependence on renal dialysis: Secondary | ICD-10-CM | POA: Diagnosis not present

## 2020-03-23 DIAGNOSIS — N186 End stage renal disease: Secondary | ICD-10-CM | POA: Diagnosis not present

## 2020-03-23 DIAGNOSIS — N2581 Secondary hyperparathyroidism of renal origin: Secondary | ICD-10-CM | POA: Diagnosis not present

## 2020-03-23 DIAGNOSIS — Z992 Dependence on renal dialysis: Secondary | ICD-10-CM | POA: Diagnosis not present

## 2020-03-23 DIAGNOSIS — D631 Anemia in chronic kidney disease: Secondary | ICD-10-CM | POA: Diagnosis not present

## 2020-03-23 DIAGNOSIS — D509 Iron deficiency anemia, unspecified: Secondary | ICD-10-CM | POA: Diagnosis not present

## 2020-03-24 ENCOUNTER — Ambulatory Visit: Payer: Medicare Other | Admitting: Podiatry

## 2020-03-25 DIAGNOSIS — N186 End stage renal disease: Secondary | ICD-10-CM | POA: Diagnosis not present

## 2020-03-25 DIAGNOSIS — D631 Anemia in chronic kidney disease: Secondary | ICD-10-CM | POA: Diagnosis not present

## 2020-03-25 DIAGNOSIS — Z992 Dependence on renal dialysis: Secondary | ICD-10-CM | POA: Diagnosis not present

## 2020-03-25 DIAGNOSIS — D509 Iron deficiency anemia, unspecified: Secondary | ICD-10-CM | POA: Diagnosis not present

## 2020-03-25 DIAGNOSIS — N2581 Secondary hyperparathyroidism of renal origin: Secondary | ICD-10-CM | POA: Diagnosis not present

## 2020-03-27 DIAGNOSIS — Z992 Dependence on renal dialysis: Secondary | ICD-10-CM | POA: Diagnosis not present

## 2020-03-27 DIAGNOSIS — N186 End stage renal disease: Secondary | ICD-10-CM | POA: Diagnosis not present

## 2020-03-28 DIAGNOSIS — L03115 Cellulitis of right lower limb: Secondary | ICD-10-CM | POA: Diagnosis not present

## 2020-03-28 DIAGNOSIS — L03116 Cellulitis of left lower limb: Secondary | ICD-10-CM | POA: Diagnosis not present

## 2020-03-28 DIAGNOSIS — E8779 Other fluid overload: Secondary | ICD-10-CM | POA: Diagnosis not present

## 2020-03-28 DIAGNOSIS — D509 Iron deficiency anemia, unspecified: Secondary | ICD-10-CM | POA: Diagnosis not present

## 2020-03-28 DIAGNOSIS — Z992 Dependence on renal dialysis: Secondary | ICD-10-CM | POA: Diagnosis not present

## 2020-03-28 DIAGNOSIS — N2581 Secondary hyperparathyroidism of renal origin: Secondary | ICD-10-CM | POA: Diagnosis not present

## 2020-03-28 DIAGNOSIS — D631 Anemia in chronic kidney disease: Secondary | ICD-10-CM | POA: Diagnosis not present

## 2020-03-28 DIAGNOSIS — N186 End stage renal disease: Secondary | ICD-10-CM | POA: Diagnosis not present

## 2020-03-31 ENCOUNTER — Ambulatory Visit (INDEPENDENT_AMBULATORY_CARE_PROVIDER_SITE_OTHER): Payer: Medicare Other | Admitting: Podiatry

## 2020-03-31 ENCOUNTER — Other Ambulatory Visit: Payer: Self-pay

## 2020-03-31 ENCOUNTER — Encounter: Payer: Self-pay | Admitting: Podiatry

## 2020-03-31 DIAGNOSIS — Z89432 Acquired absence of left foot: Secondary | ICD-10-CM

## 2020-03-31 DIAGNOSIS — B351 Tinea unguium: Secondary | ICD-10-CM

## 2020-03-31 DIAGNOSIS — E1149 Type 2 diabetes mellitus with other diabetic neurological complication: Secondary | ICD-10-CM

## 2020-03-31 DIAGNOSIS — M79676 Pain in unspecified toe(s): Secondary | ICD-10-CM

## 2020-03-31 DIAGNOSIS — E1140 Type 2 diabetes mellitus with diabetic neuropathy, unspecified: Secondary | ICD-10-CM | POA: Diagnosis not present

## 2020-04-01 DIAGNOSIS — Z992 Dependence on renal dialysis: Secondary | ICD-10-CM | POA: Diagnosis not present

## 2020-04-01 DIAGNOSIS — D509 Iron deficiency anemia, unspecified: Secondary | ICD-10-CM | POA: Diagnosis not present

## 2020-04-01 DIAGNOSIS — N2581 Secondary hyperparathyroidism of renal origin: Secondary | ICD-10-CM | POA: Diagnosis not present

## 2020-04-01 DIAGNOSIS — D631 Anemia in chronic kidney disease: Secondary | ICD-10-CM | POA: Diagnosis not present

## 2020-04-01 DIAGNOSIS — L03115 Cellulitis of right lower limb: Secondary | ICD-10-CM | POA: Diagnosis not present

## 2020-04-01 DIAGNOSIS — N186 End stage renal disease: Secondary | ICD-10-CM | POA: Diagnosis not present

## 2020-04-02 DIAGNOSIS — L03115 Cellulitis of right lower limb: Secondary | ICD-10-CM | POA: Diagnosis not present

## 2020-04-02 DIAGNOSIS — D631 Anemia in chronic kidney disease: Secondary | ICD-10-CM | POA: Diagnosis not present

## 2020-04-02 DIAGNOSIS — Z992 Dependence on renal dialysis: Secondary | ICD-10-CM | POA: Diagnosis not present

## 2020-04-02 DIAGNOSIS — D509 Iron deficiency anemia, unspecified: Secondary | ICD-10-CM | POA: Diagnosis not present

## 2020-04-02 DIAGNOSIS — N186 End stage renal disease: Secondary | ICD-10-CM | POA: Diagnosis not present

## 2020-04-02 DIAGNOSIS — N2581 Secondary hyperparathyroidism of renal origin: Secondary | ICD-10-CM | POA: Diagnosis not present

## 2020-04-02 NOTE — Progress Notes (Signed)
Subjective:  Patient ID: Janet Mitchell, female    DOB: 1965-04-25,  MRN: [id number]  55 y.o. female presents with at risk foot care. Patient has h/o amputation of 1st ray amputation left lower extremity and  h/o ESRD on hemodialysis and painful thick toenails that are difficult to trim. Pain interferes with ambulation. Aggravating factors include wearing enclosed shoe gear. Pain is relieved with periodic professional debridement.Marland Kitchen    PCP: Lindell Spar, MD and last visit was: 02/18/2020.  Review of Systems: Negative except as noted in the HPI.  Past Medical History:  Diagnosis Date  . Anemia   . Arthritis   . CHF (congestive heart failure) ([place])    pt states she has been cleared of heart failure/disease  . Chronic cholecystitis with calculus   . Chronic kidney disease    Phreesia 12/12/2019  . COVID-19 virus infection 03/2019  . ESRD (end stage renal disease) on dialysis [facility])    Dialysis M-W-F  . Headache    "a few/wk" (03/09/2018) - no longer having these  . History of blood transfusion 10/2017   "low blood count" (03/09/2018)  . Hypertension   . Seasonal allergies   . Spinal headache   . Type II diabetes mellitus ([place])    Past Surgical History:  Procedure Laterality Date  . AMPUTATION TOE Left 2013   Great toe  . AV FISTULA PLACEMENT Left 07/22/2019   Procedure: LEFT ARM BASILIC ARTERIOVENOUS (AV) FISTULA CREATION;  Surgeon: Rosetta Posner, MD;  Location: [place];  Service: Vascular;  Laterality: Left;  . BASCILIC VEIN TRANSPOSITION Left 10/17/2019   Procedure: LEFT SECOND STAGE BASCILIC VEIN TRANSPOSITION;  Surgeon: Rosetta Posner, MD;  Location: [place];  Service: Vascular;  Laterality: Left;  . BIOPSY  10/24/2018   Procedure: BIOPSY;  Surgeon: Daneil Dolin, MD;  [facility];  Service: Endoscopy;;  right and left colon  . CATARACT EXTRACTION W/ INTRAOCULAR LENS IMPLANT Right   . CESAREAN SECTION  1994; 1998  . CHOLECYSTECTOMY N/A 03/09/2018   Procedure:  LAPAROSCOPIC CHOLECYSTECTOMY WITH INTRAOPERATIVE CHOLANGIOGRAM ERAS PATHWAY;  Surgeon: Donnie Mesa, MD;  Location: [place];  Service: General;  Laterality: N/A;  . COLONOSCOPY N/A 10/24/2018   Procedure: COLONOSCOPY;  Surgeon: Daneil Dolin, MD;  [facility];  Service: Endoscopy;  Laterality: N/A;  1:00pm  . EYE SURGERY Left 05/15/2018   Removal of blood in the globe (due to DM)  . FLEXIBLE SIGMOIDOSCOPY N/A 11/24/2017   Procedure: FLEXIBLE SIGMOIDOSCOPY;  Surgeon: Daneil Dolin, MD;  [facility];  Service: Endoscopy;  Laterality: N/A;  . IR FLUORO GUIDE CV LINE RIGHT  07/17/2019  . IR PARACENTESIS  07/09/2019  . IR US GUIDE VASC ACCESS RIGHT  07/17/2019  . LAPAROSCOPIC CHOLECYSTECTOMY  03/09/2018  . PERITONEAL CATHETER INSERTION  2017  . POLYPECTOMY  10/24/2018   Procedure: POLYPECTOMY;  Surgeon: Daneil Dolin, MD;  [facility];  Service: Endoscopy;;  . TOTAL HIP ARTHROPLASTY Left 1997   Patient Active Problem List   Diagnosis Date Noted  . Abdominal wall hematoma 12/26/2019  . DOE (dyspnea on exertion) 11/21/2019  . Acute blood loss anemia   . COVID-19 03/26/2019  . ESRD (end stage renal disease) ([place])   . Class 2 severe obesity due to excess calories with serious comorbidity and body mass index (BMI) of 37.0 to 37.9 in adult [facility]) 02/14/2019  . SBP (spontaneous bacterial peritonitis) ([place]) 12/18/2018  . Generalized abdominal pain  11/28/2018  . Abnormal nuclear stress test 11/02/2018  . Rectal bleeding 08/27/2018  . Diarrhea 05/22/2018  . Microcytic anemia 03/25/2018  . Chronic cholecystitis with calculus 03/09/2018  . Type 2 diabetes mellitus with ESRD (end-stage renal disease) ([place]) 03/09/2018  . Pre-transplant evaluation for ESRD (end stage renal disease) 12/19/2017  . Symptomatic anemia 11/06/2017  . Essential hypertension 11/06/2017  . ESRD on peritoneal dialysis ([place]) 11/06/2017  . Dependence on renal dialysis ([place]) 11/06/2017  .  Diabetic retinopathy ([place]) 01/23/2016  . End stage renal disease on dialysis due to type 2 diabetes mellitus ([place]) 01/23/2016  . Malignant hypertension 01/23/2016  . Renal osteodystrophy 01/23/2016  . Diabetic macular edema ([place]) 04/10/2015  . Edema of lower extremity 04/10/2015  . Paresthesia of lower extremity 04/10/2015  . Recurrent falls 04/10/2015    Current Outpatient Medications:  .  acetaminophen (TYLENOL) 325 MG tablet, Take 650 mg by mouth every 6 (six) hours as needed (pain). , Disp: , Rfl:  .  amLODipine (NORVASC) 10 MG tablet, Take 1 tablet (10 mg total) by mouth daily., Disp: 90 tablet, Rfl: 3 .  aspirin EC 81 MG tablet, Take 1 tablet (81 mg total) by mouth daily., Disp:  , Rfl:  .  B Complex-C-Folic Acid (RENA-VITE PO), Take 1 tablet by mouth daily., Disp: , Rfl:  .  Blood Glucose Monitoring Suppl (ACCU-CHEK GUIDE) w/Device KIT, 1 Piece by Does not apply route as directed., Disp: 1 kit, Rfl: 0 .  calcitRIOL (ROCALTROL) 0.5 MCG capsule, Take 1-2 capsules (0.5-1 mcg total) by mouth See admin instructions. 0.311mg on Mondays through Fridays and take 110m on Saturdays and Sundays (Patient taking differently: Take 0.5-1 mcg by mouth See admin instructions. Take 0.11m63mon Mondays through Fridays and take 1mc22mn Saturdays and Sundays), Disp: 90 capsule, Rfl: 1 .  calcium acetate (PHOSLO) 667 MG capsule, Take by mouth., Disp: , Rfl:  .  cetirizine (ZYRTEC) 10 MG tablet, Take 1 tablet (10 mg total) by mouth daily., Disp: 30 tablet, Rfl: 1 .  cholestyramine (QUESTRAN) 4 g packet, Take 0.5 packets (2 g total) by mouth daily. Do not take within 2 hours of other medications. (Patient not taking: Reported on 03/31/2020), Disp: 30 each, Rfl: 3 .  cloNIDine (CATAPRES) 0.1 MG tablet, Take 1 tablet (0.1 mg total) by mouth 2 (two) times daily., Disp: 90 tablet, Rfl: 1 .  glucose blood (ACCU-CHEK GUIDE) test strip, Use as instructed, Disp: 150 each, Rfl: 2 .  HYDROcodone-acetaminophen (NORCO/VICODIN)  5-325 MG tablet, Take 1 tablet by mouth every 8 (eight) hours as needed. (Patient not taking: Reported on 03/31/2020), Disp: , Rfl:  .  insulin glargine, 2 Unit Dial, (TOUJEO MAX SOLOSTAR) 300 UNIT/ML Solostar Pen, Inject 10 Units into the skin daily. (Patient not taking: Reported on 03/31/2020), Disp: , Rfl:  .  labetalol (NORMODYNE) 300 MG tablet, Take 300 mg by mouth 2 (two) times daily.  (Patient not taking: Reported on 03/31/2020), Disp: , Rfl:  .  losartan (COZAAR) 100 MG tablet, Take 1 tablet (100 mg total) by mouth daily., Disp: 90 tablet, Rfl: 3 .  psyllium (METAMUCIL) 58.6 % packet, Take by mouth. (Patient not taking: Reported on 03/31/2020), Disp: , Rfl:   Current Facility-Administered Medications:  .  ferrous sulfate tablet 325 mg, 325 mg, Oral, BID WC, Pahwani, Rinka R, MD No Known Allergies Social History   Tobacco Use  Smoking Status Passive Smoke Exposure - Never Smoker  Smokeless Tobacco Never Used    Objective:  There were no vitals filed for this visit. Constitutional Patient is a pleasant 55 y.o. African American female obese in NAD. AAO x 3.  Vascular Capillary refill time to digits immediate b/l. Palpable pedal pulses b/l LE. Pedal hair absent. Lower extremity skin temperature gradient within normal limits. No ischemia or gangrene noted b/l lower extremities. No cyanosis or clubbing noted.  Neurologic Normal speech. Protective sensation diminished with 10g monofilament b/l. Vibratory sensation diminished b/l.  Dermatologic Pedal skin with normal turgor, texture and tone bilaterally. No open wounds bilaterally. No interdigital macerations bilaterally. Toenails 2-5 bilaterally and R hallux elongated, discolored, dystrophic, thickened, and crumbly with subungual debris and tenderness to dorsal palpation.  Orthopedic: Normal muscle strength 5/5 to all lower extremity muscle groups bilaterally. No pain crepitus or joint limitation noted with ROM b/l. Lower extremity amputation(s): 1st  ray amputation left lower extremity.   Hemoglobin A1C Latest Ref Rng & Units 07/08/2019 04/03/2019  HGBA1C 4.8 - 5.6 % 7.2(H) 8.5(H)  Some recent data might be hidden   Assessment:   1. Onychomycosis   2. Status post amputation of left foot through metatarsal bone (HCC)   3. Diabetic neuropathy with neurologic complication [facility])    Plan:  Patient was evaluated and treated and all questions answered.  Onychomycosis with pain -Nails palliatively debridement as below. -Educated on self-care  Procedure: Nail Debridement Rationale: Pain Type of Debridement: manual, sharp debridement. Instrumentation: Nail nipper, rotary burr. Number of Nails: 9  -Examined patient. -No new findings. No new orders. -Continue diabetic foot care principles. -Patient to continue soft, supportive shoe gear daily. -Toenails 2-5 bilaterally and R hallux debrided in length and girth without iatrogenic bleeding with sterile nail nipper and dremel.  -Patient to report any pedal injuries to medical professional immediately. -Patient/POA to call should there be question/concern in the interim.  Return in about 3 months (around 06/29/2020).  Marzetta Board, DPM

## 2020-04-03 DIAGNOSIS — L03115 Cellulitis of right lower limb: Secondary | ICD-10-CM | POA: Diagnosis not present

## 2020-04-03 DIAGNOSIS — Z992 Dependence on renal dialysis: Secondary | ICD-10-CM | POA: Diagnosis not present

## 2020-04-03 DIAGNOSIS — D631 Anemia in chronic kidney disease: Secondary | ICD-10-CM | POA: Diagnosis not present

## 2020-04-03 DIAGNOSIS — N186 End stage renal disease: Secondary | ICD-10-CM | POA: Diagnosis not present

## 2020-04-03 DIAGNOSIS — D509 Iron deficiency anemia, unspecified: Secondary | ICD-10-CM | POA: Diagnosis not present

## 2020-04-03 DIAGNOSIS — N2581 Secondary hyperparathyroidism of renal origin: Secondary | ICD-10-CM | POA: Diagnosis not present

## 2020-04-06 DIAGNOSIS — L03115 Cellulitis of right lower limb: Secondary | ICD-10-CM | POA: Diagnosis not present

## 2020-04-06 DIAGNOSIS — N186 End stage renal disease: Secondary | ICD-10-CM | POA: Diagnosis not present

## 2020-04-06 DIAGNOSIS — Z992 Dependence on renal dialysis: Secondary | ICD-10-CM | POA: Diagnosis not present

## 2020-04-06 DIAGNOSIS — N2581 Secondary hyperparathyroidism of renal origin: Secondary | ICD-10-CM | POA: Diagnosis not present

## 2020-04-06 DIAGNOSIS — E119 Type 2 diabetes mellitus without complications: Secondary | ICD-10-CM | POA: Diagnosis not present

## 2020-04-06 DIAGNOSIS — D631 Anemia in chronic kidney disease: Secondary | ICD-10-CM | POA: Diagnosis not present

## 2020-04-06 DIAGNOSIS — Z794 Long term (current) use of insulin: Secondary | ICD-10-CM | POA: Diagnosis not present

## 2020-04-06 DIAGNOSIS — D509 Iron deficiency anemia, unspecified: Secondary | ICD-10-CM | POA: Diagnosis not present

## 2020-04-08 DIAGNOSIS — D631 Anemia in chronic kidney disease: Secondary | ICD-10-CM | POA: Diagnosis not present

## 2020-04-08 DIAGNOSIS — D509 Iron deficiency anemia, unspecified: Secondary | ICD-10-CM | POA: Diagnosis not present

## 2020-04-08 DIAGNOSIS — Z992 Dependence on renal dialysis: Secondary | ICD-10-CM | POA: Diagnosis not present

## 2020-04-08 DIAGNOSIS — N2581 Secondary hyperparathyroidism of renal origin: Secondary | ICD-10-CM | POA: Diagnosis not present

## 2020-04-08 DIAGNOSIS — L03115 Cellulitis of right lower limb: Secondary | ICD-10-CM | POA: Diagnosis not present

## 2020-04-08 DIAGNOSIS — N186 End stage renal disease: Secondary | ICD-10-CM | POA: Diagnosis not present

## 2020-04-10 DIAGNOSIS — D509 Iron deficiency anemia, unspecified: Secondary | ICD-10-CM | POA: Diagnosis not present

## 2020-04-10 DIAGNOSIS — Z992 Dependence on renal dialysis: Secondary | ICD-10-CM | POA: Diagnosis not present

## 2020-04-10 DIAGNOSIS — N2581 Secondary hyperparathyroidism of renal origin: Secondary | ICD-10-CM | POA: Diagnosis not present

## 2020-04-10 DIAGNOSIS — N186 End stage renal disease: Secondary | ICD-10-CM | POA: Diagnosis not present

## 2020-04-10 DIAGNOSIS — L03115 Cellulitis of right lower limb: Secondary | ICD-10-CM | POA: Diagnosis not present

## 2020-04-10 DIAGNOSIS — D631 Anemia in chronic kidney disease: Secondary | ICD-10-CM | POA: Diagnosis not present

## 2020-04-14 DIAGNOSIS — L03116 Cellulitis of left lower limb: Secondary | ICD-10-CM | POA: Diagnosis not present

## 2020-04-15 ENCOUNTER — Ambulatory Visit (HOSPITAL_COMMUNITY)
Admission: RE | Admit: 2020-04-15 | Discharge: 2020-04-15 | Disposition: A | Discharge status: Home / Self Care | Payer: Medicare Other | Source: Ambulatory Visit | Attending: Orthopedic Surgery | Admitting: Orthopedic Surgery

## 2020-04-15 ENCOUNTER — Other Ambulatory Visit: Payer: Self-pay

## 2020-04-15 ENCOUNTER — Other Ambulatory Visit (HOSPITAL_COMMUNITY): Payer: Self-pay | Admitting: Physician Assistant

## 2020-04-15 DIAGNOSIS — M7989 Other specified soft tissue disorders: Secondary | ICD-10-CM | POA: Insufficient documentation

## 2020-04-15 DIAGNOSIS — M79605 Pain in left leg: Secondary | ICD-10-CM | POA: Diagnosis not present

## 2020-04-15 DIAGNOSIS — Z992 Dependence on renal dialysis: Secondary | ICD-10-CM | POA: Diagnosis not present

## 2020-04-15 DIAGNOSIS — D509 Iron deficiency anemia, unspecified: Secondary | ICD-10-CM | POA: Diagnosis not present

## 2020-04-15 DIAGNOSIS — N186 End stage renal disease: Secondary | ICD-10-CM | POA: Diagnosis not present

## 2020-04-15 DIAGNOSIS — L03115 Cellulitis of right lower limb: Secondary | ICD-10-CM | POA: Diagnosis not present

## 2020-04-15 DIAGNOSIS — N2581 Secondary hyperparathyroidism of renal origin: Secondary | ICD-10-CM | POA: Diagnosis not present

## 2020-04-15 DIAGNOSIS — D631 Anemia in chronic kidney disease: Secondary | ICD-10-CM | POA: Diagnosis not present

## 2020-04-20 DIAGNOSIS — D509 Iron deficiency anemia, unspecified: Secondary | ICD-10-CM | POA: Diagnosis not present

## 2020-04-20 DIAGNOSIS — D631 Anemia in chronic kidney disease: Secondary | ICD-10-CM | POA: Diagnosis not present

## 2020-04-20 DIAGNOSIS — M79662 Pain in left lower leg: Secondary | ICD-10-CM | POA: Diagnosis not present

## 2020-04-20 DIAGNOSIS — L03115 Cellulitis of right lower limb: Secondary | ICD-10-CM | POA: Diagnosis not present

## 2020-04-20 DIAGNOSIS — Z992 Dependence on renal dialysis: Secondary | ICD-10-CM | POA: Diagnosis not present

## 2020-04-20 DIAGNOSIS — N186 End stage renal disease: Secondary | ICD-10-CM | POA: Diagnosis not present

## 2020-04-20 DIAGNOSIS — L03116 Cellulitis of left lower limb: Secondary | ICD-10-CM | POA: Diagnosis not present

## 2020-04-20 DIAGNOSIS — N2581 Secondary hyperparathyroidism of renal origin: Secondary | ICD-10-CM | POA: Diagnosis not present

## 2020-04-22 DIAGNOSIS — N2581 Secondary hyperparathyroidism of renal origin: Secondary | ICD-10-CM | POA: Diagnosis not present

## 2020-04-22 DIAGNOSIS — D509 Iron deficiency anemia, unspecified: Secondary | ICD-10-CM | POA: Diagnosis not present

## 2020-04-22 DIAGNOSIS — L03115 Cellulitis of right lower limb: Secondary | ICD-10-CM | POA: Diagnosis not present

## 2020-04-22 DIAGNOSIS — Z992 Dependence on renal dialysis: Secondary | ICD-10-CM | POA: Diagnosis not present

## 2020-04-22 DIAGNOSIS — N186 End stage renal disease: Secondary | ICD-10-CM | POA: Diagnosis not present

## 2020-04-22 DIAGNOSIS — D631 Anemia in chronic kidney disease: Secondary | ICD-10-CM | POA: Diagnosis not present

## 2020-04-23 DIAGNOSIS — L03115 Cellulitis of right lower limb: Secondary | ICD-10-CM | POA: Diagnosis not present

## 2020-04-23 DIAGNOSIS — N186 End stage renal disease: Secondary | ICD-10-CM | POA: Diagnosis not present

## 2020-04-23 DIAGNOSIS — N2581 Secondary hyperparathyroidism of renal origin: Secondary | ICD-10-CM | POA: Diagnosis not present

## 2020-04-23 DIAGNOSIS — D509 Iron deficiency anemia, unspecified: Secondary | ICD-10-CM | POA: Diagnosis not present

## 2020-04-23 DIAGNOSIS — Z992 Dependence on renal dialysis: Secondary | ICD-10-CM | POA: Diagnosis not present

## 2020-04-23 DIAGNOSIS — D631 Anemia in chronic kidney disease: Secondary | ICD-10-CM | POA: Diagnosis not present

## 2020-04-24 DIAGNOSIS — N186 End stage renal disease: Secondary | ICD-10-CM | POA: Diagnosis not present

## 2020-04-24 DIAGNOSIS — D631 Anemia in chronic kidney disease: Secondary | ICD-10-CM | POA: Diagnosis not present

## 2020-04-24 DIAGNOSIS — D509 Iron deficiency anemia, unspecified: Secondary | ICD-10-CM | POA: Diagnosis not present

## 2020-04-24 DIAGNOSIS — N2581 Secondary hyperparathyroidism of renal origin: Secondary | ICD-10-CM | POA: Diagnosis not present

## 2020-04-24 DIAGNOSIS — Z992 Dependence on renal dialysis: Secondary | ICD-10-CM | POA: Diagnosis not present

## 2020-04-24 DIAGNOSIS — L03115 Cellulitis of right lower limb: Secondary | ICD-10-CM | POA: Diagnosis not present

## 2020-04-27 DIAGNOSIS — L03115 Cellulitis of right lower limb: Secondary | ICD-10-CM | POA: Diagnosis not present

## 2020-04-27 DIAGNOSIS — D631 Anemia in chronic kidney disease: Secondary | ICD-10-CM | POA: Diagnosis not present

## 2020-04-27 DIAGNOSIS — Z992 Dependence on renal dialysis: Secondary | ICD-10-CM | POA: Diagnosis not present

## 2020-04-27 DIAGNOSIS — D509 Iron deficiency anemia, unspecified: Secondary | ICD-10-CM | POA: Diagnosis not present

## 2020-04-27 DIAGNOSIS — N2581 Secondary hyperparathyroidism of renal origin: Secondary | ICD-10-CM | POA: Diagnosis not present

## 2020-04-27 DIAGNOSIS — N186 End stage renal disease: Secondary | ICD-10-CM | POA: Diagnosis not present

## 2020-04-29 DIAGNOSIS — N186 End stage renal disease: Secondary | ICD-10-CM | POA: Diagnosis not present

## 2020-04-29 DIAGNOSIS — D631 Anemia in chronic kidney disease: Secondary | ICD-10-CM | POA: Diagnosis not present

## 2020-04-29 DIAGNOSIS — L03116 Cellulitis of left lower limb: Secondary | ICD-10-CM | POA: Diagnosis not present

## 2020-04-29 DIAGNOSIS — N2581 Secondary hyperparathyroidism of renal origin: Secondary | ICD-10-CM | POA: Diagnosis not present

## 2020-04-29 DIAGNOSIS — Z992 Dependence on renal dialysis: Secondary | ICD-10-CM | POA: Diagnosis not present

## 2020-04-29 DIAGNOSIS — D509 Iron deficiency anemia, unspecified: Secondary | ICD-10-CM | POA: Diagnosis not present

## 2020-05-01 DIAGNOSIS — L03116 Cellulitis of left lower limb: Secondary | ICD-10-CM | POA: Diagnosis not present

## 2020-05-01 DIAGNOSIS — N186 End stage renal disease: Secondary | ICD-10-CM | POA: Diagnosis not present

## 2020-05-01 DIAGNOSIS — D631 Anemia in chronic kidney disease: Secondary | ICD-10-CM | POA: Diagnosis not present

## 2020-05-01 DIAGNOSIS — D509 Iron deficiency anemia, unspecified: Secondary | ICD-10-CM | POA: Diagnosis not present

## 2020-05-01 DIAGNOSIS — Z992 Dependence on renal dialysis: Secondary | ICD-10-CM | POA: Diagnosis not present

## 2020-05-01 DIAGNOSIS — N2581 Secondary hyperparathyroidism of renal origin: Secondary | ICD-10-CM | POA: Diagnosis not present

## 2020-05-04 DIAGNOSIS — Z992 Dependence on renal dialysis: Secondary | ICD-10-CM | POA: Diagnosis not present

## 2020-05-04 DIAGNOSIS — D631 Anemia in chronic kidney disease: Secondary | ICD-10-CM | POA: Diagnosis not present

## 2020-05-04 DIAGNOSIS — U071 COVID-19: Secondary | ICD-10-CM | POA: Diagnosis not present

## 2020-05-04 DIAGNOSIS — L03116 Cellulitis of left lower limb: Secondary | ICD-10-CM | POA: Diagnosis not present

## 2020-05-04 DIAGNOSIS — D509 Iron deficiency anemia, unspecified: Secondary | ICD-10-CM | POA: Diagnosis not present

## 2020-05-04 DIAGNOSIS — N2581 Secondary hyperparathyroidism of renal origin: Secondary | ICD-10-CM | POA: Diagnosis not present

## 2020-05-04 DIAGNOSIS — N186 End stage renal disease: Secondary | ICD-10-CM | POA: Diagnosis not present

## 2020-05-05 ENCOUNTER — Other Ambulatory Visit: Payer: Medicare Other

## 2020-05-07 DIAGNOSIS — D631 Anemia in chronic kidney disease: Secondary | ICD-10-CM | POA: Diagnosis not present

## 2020-05-07 DIAGNOSIS — L03116 Cellulitis of left lower limb: Secondary | ICD-10-CM | POA: Diagnosis not present

## 2020-05-07 DIAGNOSIS — N186 End stage renal disease: Secondary | ICD-10-CM | POA: Diagnosis not present

## 2020-05-07 DIAGNOSIS — D509 Iron deficiency anemia, unspecified: Secondary | ICD-10-CM | POA: Diagnosis not present

## 2020-05-07 DIAGNOSIS — N2581 Secondary hyperparathyroidism of renal origin: Secondary | ICD-10-CM | POA: Diagnosis not present

## 2020-05-07 DIAGNOSIS — Z992 Dependence on renal dialysis: Secondary | ICD-10-CM | POA: Diagnosis not present

## 2020-05-09 DIAGNOSIS — D509 Iron deficiency anemia, unspecified: Secondary | ICD-10-CM | POA: Diagnosis not present

## 2020-05-09 DIAGNOSIS — L03116 Cellulitis of left lower limb: Secondary | ICD-10-CM | POA: Diagnosis not present

## 2020-05-09 DIAGNOSIS — D631 Anemia in chronic kidney disease: Secondary | ICD-10-CM | POA: Diagnosis not present

## 2020-05-09 DIAGNOSIS — N2581 Secondary hyperparathyroidism of renal origin: Secondary | ICD-10-CM | POA: Diagnosis not present

## 2020-05-09 DIAGNOSIS — Z992 Dependence on renal dialysis: Secondary | ICD-10-CM | POA: Diagnosis not present

## 2020-05-09 DIAGNOSIS — N186 End stage renal disease: Secondary | ICD-10-CM | POA: Diagnosis not present

## 2020-05-12 DIAGNOSIS — D509 Iron deficiency anemia, unspecified: Secondary | ICD-10-CM | POA: Diagnosis not present

## 2020-05-12 DIAGNOSIS — N2581 Secondary hyperparathyroidism of renal origin: Secondary | ICD-10-CM | POA: Diagnosis not present

## 2020-05-12 DIAGNOSIS — D631 Anemia in chronic kidney disease: Secondary | ICD-10-CM | POA: Diagnosis not present

## 2020-05-12 DIAGNOSIS — N186 End stage renal disease: Secondary | ICD-10-CM | POA: Diagnosis not present

## 2020-05-12 DIAGNOSIS — Z992 Dependence on renal dialysis: Secondary | ICD-10-CM | POA: Diagnosis not present

## 2020-05-12 DIAGNOSIS — L03116 Cellulitis of left lower limb: Secondary | ICD-10-CM | POA: Diagnosis not present

## 2020-05-14 DIAGNOSIS — D631 Anemia in chronic kidney disease: Secondary | ICD-10-CM | POA: Diagnosis not present

## 2020-05-14 DIAGNOSIS — D509 Iron deficiency anemia, unspecified: Secondary | ICD-10-CM | POA: Diagnosis not present

## 2020-05-14 DIAGNOSIS — L03116 Cellulitis of left lower limb: Secondary | ICD-10-CM | POA: Diagnosis not present

## 2020-05-14 DIAGNOSIS — Z992 Dependence on renal dialysis: Secondary | ICD-10-CM | POA: Diagnosis not present

## 2020-05-14 DIAGNOSIS — N2581 Secondary hyperparathyroidism of renal origin: Secondary | ICD-10-CM | POA: Diagnosis not present

## 2020-05-14 DIAGNOSIS — N186 End stage renal disease: Secondary | ICD-10-CM | POA: Diagnosis not present

## 2020-05-15 DIAGNOSIS — N186 End stage renal disease: Secondary | ICD-10-CM | POA: Diagnosis not present

## 2020-05-15 DIAGNOSIS — L03116 Cellulitis of left lower limb: Secondary | ICD-10-CM | POA: Diagnosis not present

## 2020-05-15 DIAGNOSIS — Z992 Dependence on renal dialysis: Secondary | ICD-10-CM | POA: Diagnosis not present

## 2020-05-15 DIAGNOSIS — D631 Anemia in chronic kidney disease: Secondary | ICD-10-CM | POA: Diagnosis not present

## 2020-05-15 DIAGNOSIS — N2581 Secondary hyperparathyroidism of renal origin: Secondary | ICD-10-CM | POA: Diagnosis not present

## 2020-05-15 DIAGNOSIS — D509 Iron deficiency anemia, unspecified: Secondary | ICD-10-CM | POA: Diagnosis not present

## 2020-05-18 DIAGNOSIS — D631 Anemia in chronic kidney disease: Secondary | ICD-10-CM | POA: Diagnosis not present

## 2020-05-18 DIAGNOSIS — N186 End stage renal disease: Secondary | ICD-10-CM | POA: Diagnosis not present

## 2020-05-18 DIAGNOSIS — Z992 Dependence on renal dialysis: Secondary | ICD-10-CM | POA: Diagnosis not present

## 2020-05-18 DIAGNOSIS — L03116 Cellulitis of left lower limb: Secondary | ICD-10-CM | POA: Diagnosis not present

## 2020-05-18 DIAGNOSIS — D509 Iron deficiency anemia, unspecified: Secondary | ICD-10-CM | POA: Diagnosis not present

## 2020-05-18 DIAGNOSIS — N2581 Secondary hyperparathyroidism of renal origin: Secondary | ICD-10-CM | POA: Diagnosis not present

## 2020-05-19 ENCOUNTER — Other Ambulatory Visit: Payer: Self-pay | Admitting: Internal Medicine

## 2020-05-19 DIAGNOSIS — I10 Essential (primary) hypertension: Secondary | ICD-10-CM

## 2020-05-20 DIAGNOSIS — Z992 Dependence on renal dialysis: Secondary | ICD-10-CM | POA: Diagnosis not present

## 2020-05-20 DIAGNOSIS — D631 Anemia in chronic kidney disease: Secondary | ICD-10-CM | POA: Diagnosis not present

## 2020-05-20 DIAGNOSIS — D509 Iron deficiency anemia, unspecified: Secondary | ICD-10-CM | POA: Diagnosis not present

## 2020-05-20 DIAGNOSIS — N186 End stage renal disease: Secondary | ICD-10-CM | POA: Diagnosis not present

## 2020-05-20 DIAGNOSIS — L03116 Cellulitis of left lower limb: Secondary | ICD-10-CM | POA: Diagnosis not present

## 2020-05-20 DIAGNOSIS — N2581 Secondary hyperparathyroidism of renal origin: Secondary | ICD-10-CM | POA: Diagnosis not present

## 2020-05-22 DIAGNOSIS — N186 End stage renal disease: Secondary | ICD-10-CM | POA: Diagnosis not present

## 2020-05-22 DIAGNOSIS — N2581 Secondary hyperparathyroidism of renal origin: Secondary | ICD-10-CM | POA: Diagnosis not present

## 2020-05-22 DIAGNOSIS — Z992 Dependence on renal dialysis: Secondary | ICD-10-CM | POA: Diagnosis not present

## 2020-05-22 DIAGNOSIS — L03116 Cellulitis of left lower limb: Secondary | ICD-10-CM | POA: Diagnosis not present

## 2020-05-22 DIAGNOSIS — D509 Iron deficiency anemia, unspecified: Secondary | ICD-10-CM | POA: Diagnosis not present

## 2020-05-22 DIAGNOSIS — D631 Anemia in chronic kidney disease: Secondary | ICD-10-CM | POA: Diagnosis not present

## 2020-05-25 DIAGNOSIS — Z992 Dependence on renal dialysis: Secondary | ICD-10-CM | POA: Diagnosis not present

## 2020-05-25 DIAGNOSIS — N2581 Secondary hyperparathyroidism of renal origin: Secondary | ICD-10-CM | POA: Diagnosis not present

## 2020-05-25 DIAGNOSIS — D509 Iron deficiency anemia, unspecified: Secondary | ICD-10-CM | POA: Diagnosis not present

## 2020-05-25 DIAGNOSIS — L03116 Cellulitis of left lower limb: Secondary | ICD-10-CM | POA: Diagnosis not present

## 2020-05-25 DIAGNOSIS — N186 End stage renal disease: Secondary | ICD-10-CM | POA: Diagnosis not present

## 2020-05-25 DIAGNOSIS — D631 Anemia in chronic kidney disease: Secondary | ICD-10-CM | POA: Diagnosis not present

## 2020-05-26 DIAGNOSIS — Z992 Dependence on renal dialysis: Secondary | ICD-10-CM | POA: Diagnosis not present

## 2020-05-26 DIAGNOSIS — Z452 Encounter for adjustment and management of vascular access device: Secondary | ICD-10-CM | POA: Diagnosis not present

## 2020-05-26 DIAGNOSIS — N186 End stage renal disease: Secondary | ICD-10-CM | POA: Diagnosis not present

## 2020-05-27 DIAGNOSIS — N2581 Secondary hyperparathyroidism of renal origin: Secondary | ICD-10-CM | POA: Diagnosis not present

## 2020-05-27 DIAGNOSIS — Z992 Dependence on renal dialysis: Secondary | ICD-10-CM | POA: Diagnosis not present

## 2020-05-27 DIAGNOSIS — D631 Anemia in chronic kidney disease: Secondary | ICD-10-CM | POA: Diagnosis not present

## 2020-05-27 DIAGNOSIS — N186 End stage renal disease: Secondary | ICD-10-CM | POA: Diagnosis not present

## 2020-05-27 DIAGNOSIS — D509 Iron deficiency anemia, unspecified: Secondary | ICD-10-CM | POA: Diagnosis not present

## 2020-05-28 ENCOUNTER — Ambulatory Visit: Payer: Medicare Other | Admitting: Internal Medicine

## 2020-05-29 DIAGNOSIS — D509 Iron deficiency anemia, unspecified: Secondary | ICD-10-CM | POA: Diagnosis not present

## 2020-05-29 DIAGNOSIS — N186 End stage renal disease: Secondary | ICD-10-CM | POA: Diagnosis not present

## 2020-05-29 DIAGNOSIS — Z992 Dependence on renal dialysis: Secondary | ICD-10-CM | POA: Diagnosis not present

## 2020-05-29 DIAGNOSIS — N2581 Secondary hyperparathyroidism of renal origin: Secondary | ICD-10-CM | POA: Diagnosis not present

## 2020-05-29 DIAGNOSIS — D631 Anemia in chronic kidney disease: Secondary | ICD-10-CM | POA: Diagnosis not present

## 2020-06-01 DIAGNOSIS — N2581 Secondary hyperparathyroidism of renal origin: Secondary | ICD-10-CM | POA: Diagnosis not present

## 2020-06-01 DIAGNOSIS — N186 End stage renal disease: Secondary | ICD-10-CM | POA: Diagnosis not present

## 2020-06-01 DIAGNOSIS — Z992 Dependence on renal dialysis: Secondary | ICD-10-CM | POA: Diagnosis not present

## 2020-06-01 DIAGNOSIS — D631 Anemia in chronic kidney disease: Secondary | ICD-10-CM | POA: Diagnosis not present

## 2020-06-01 DIAGNOSIS — D509 Iron deficiency anemia, unspecified: Secondary | ICD-10-CM | POA: Diagnosis not present

## 2020-06-03 DIAGNOSIS — D631 Anemia in chronic kidney disease: Secondary | ICD-10-CM | POA: Diagnosis not present

## 2020-06-03 DIAGNOSIS — D509 Iron deficiency anemia, unspecified: Secondary | ICD-10-CM | POA: Diagnosis not present

## 2020-06-03 DIAGNOSIS — N186 End stage renal disease: Secondary | ICD-10-CM | POA: Diagnosis not present

## 2020-06-03 DIAGNOSIS — Z992 Dependence on renal dialysis: Secondary | ICD-10-CM | POA: Diagnosis not present

## 2020-06-03 DIAGNOSIS — N2581 Secondary hyperparathyroidism of renal origin: Secondary | ICD-10-CM | POA: Diagnosis not present

## 2020-06-05 DIAGNOSIS — D509 Iron deficiency anemia, unspecified: Secondary | ICD-10-CM | POA: Diagnosis not present

## 2020-06-05 DIAGNOSIS — D631 Anemia in chronic kidney disease: Secondary | ICD-10-CM | POA: Diagnosis not present

## 2020-06-05 DIAGNOSIS — N186 End stage renal disease: Secondary | ICD-10-CM | POA: Diagnosis not present

## 2020-06-05 DIAGNOSIS — Z992 Dependence on renal dialysis: Secondary | ICD-10-CM | POA: Diagnosis not present

## 2020-06-05 DIAGNOSIS — N2581 Secondary hyperparathyroidism of renal origin: Secondary | ICD-10-CM | POA: Diagnosis not present

## 2020-06-08 DIAGNOSIS — D509 Iron deficiency anemia, unspecified: Secondary | ICD-10-CM | POA: Diagnosis not present

## 2020-06-08 DIAGNOSIS — N2581 Secondary hyperparathyroidism of renal origin: Secondary | ICD-10-CM | POA: Diagnosis not present

## 2020-06-08 DIAGNOSIS — D631 Anemia in chronic kidney disease: Secondary | ICD-10-CM | POA: Diagnosis not present

## 2020-06-08 DIAGNOSIS — N186 End stage renal disease: Secondary | ICD-10-CM | POA: Diagnosis not present

## 2020-06-08 DIAGNOSIS — Z992 Dependence on renal dialysis: Secondary | ICD-10-CM | POA: Diagnosis not present

## 2020-06-09 ENCOUNTER — Other Ambulatory Visit: Payer: Self-pay

## 2020-06-09 ENCOUNTER — Ambulatory Visit (INDEPENDENT_AMBULATORY_CARE_PROVIDER_SITE_OTHER): Payer: Medicare Other | Admitting: Internal Medicine

## 2020-06-09 ENCOUNTER — Encounter: Payer: Self-pay | Admitting: Internal Medicine

## 2020-06-09 VITALS — BP 153/86 | HR 80 | Resp 18 | Ht 68.0 in | Wt 219.1 lb

## 2020-06-09 DIAGNOSIS — Z1231 Encounter for screening mammogram for malignant neoplasm of breast: Secondary | ICD-10-CM

## 2020-06-09 DIAGNOSIS — E1122 Type 2 diabetes mellitus with diabetic chronic kidney disease: Secondary | ICD-10-CM

## 2020-06-09 DIAGNOSIS — I10 Essential (primary) hypertension: Secondary | ICD-10-CM

## 2020-06-09 DIAGNOSIS — N186 End stage renal disease: Secondary | ICD-10-CM

## 2020-06-09 LAB — POCT GLYCOSYLATED HEMOGLOBIN (HGB A1C): HbA1c, POC (prediabetic range): 6.4 % (ref 5.7–6.4)

## 2020-06-09 MED ORDER — LABETALOL HCL 300 MG PO TABS: 300.0000 mg | ORAL_TABLET | Freq: Two times a day (BID) | ORAL | 2 refills | Status: DC

## 2020-06-09 MED ORDER — CLONIDINE HCL 0.1 MG PO TABS: ORAL_TABLET | ORAL | 2 refills | Status: DC

## 2020-06-09 NOTE — Assessment & Plan Note (Signed)
HbA1C: 6.4 Uses Toujeo at nighttime Follows up with Nephrologist for ESRD on HD On kidney transplant list at [facility]

## 2020-06-09 NOTE — Progress Notes (Signed)
Established Patient Office Visit  Subjective:  Patient ID: Janet Mitchell, female    DOB: 29-Jul-1965  Age: 55 y.o. MRN: [id number]  CC:  Chief Complaint  Patient presents with   Follow-up    Follow up bp check has been running high     HPI Janet Mitchell is a 55 y.o.femalewith PMH of HTN and DM with ESRD on HD presents for follow-up for her hypertension and DM.  HTN: BP is uncontrolled. She has run out of Labetalol and could not get it from Pharmacy. She has been having generalized, dull headache intermittently. Patient denies chest pain, dyspnea or palpitations.  DM: HbA1C is 6.4. She takes Toujeo 10 U qHS. She is compliant with diet recommendation. Denies any polyuria or polydipsia.   Past Medical History:  Diagnosis Date   Abdominal wall hematoma 12/26/2019   Anemia    Arthritis    CHF (congestive heart failure) ([place])    pt states she has been cleared of heart failure/disease   Chronic cholecystitis with calculus    Chronic kidney disease    Phreesia 12/12/2019   COVID-19 03/26/2019   COVID-19 virus infection 03/2019   ESRD (end stage renal disease) on dialysis [facility])    Dialysis M-W-F   Headache    "a few/wk" (03/09/2018) - no longer having these   History of blood transfusion 10/2017   "low blood count" (03/09/2018)   Hypertension    Seasonal allergies    Spinal headache    Type II diabetes mellitus ([place])     Past Surgical History:  Procedure Laterality Date   AMPUTATION TOE Left 2013   Great toe   AV FISTULA PLACEMENT Left 07/22/2019   Procedure: LEFT ARM BASILIC ARTERIOVENOUS (AV) FISTULA CREATION;  Surgeon: Rosetta Posner, MD;  Location: [place];  Service: Vascular;  Laterality: Left;   [place] Left 10/17/2019   Procedure: LEFT SECOND STAGE [place];  Surgeon: Rosetta Posner, MD;  Location: [place];  Service: Vascular;  Laterality: Left;   BIOPSY  10/24/2018   Procedure: BIOPSY;  Surgeon: Daneil Dolin, MD;  [facility];  Service: Endoscopy;;  right and left colon   CATARACT EXTRACTION W/ INTRAOCULAR LENS IMPLANT Right    [place]; [place] N/A 03/09/2018   Procedure: LAPAROSCOPIC CHOLECYSTECTOMY WITH INTRAOPERATIVE CHOLANGIOGRAM ERAS PATHWAY;  Surgeon: Donnie Mesa, MD;  Location: [place];  Service: General;  Laterality: N/A;   COLONOSCOPY N/A 10/24/2018   Procedure: COLONOSCOPY;  Surgeon: Daneil Dolin, MD;  [facility];  Service: Endoscopy;  Laterality: N/A;  1:00pm   EYE SURGERY Left 05/15/2018   Removal of blood in the globe (due to DM)   FLEXIBLE SIGMOIDOSCOPY N/A 11/24/2017   Procedure: FLEXIBLE SIGMOIDOSCOPY;  Surgeon: Daneil Dolin, MD;  [facility];  Service: Endoscopy;  Laterality: N/A;   IR FLUORO GUIDE CV LINE RIGHT  07/17/2019   IR PARACENTESIS  07/09/2019   IR US GUIDE VASC ACCESS RIGHT  07/17/2019   LAPAROSCOPIC CHOLECYSTECTOMY  03/09/2018   PERITONEAL CATHETER INSERTION  2017   POLYPECTOMY  10/24/2018   Procedure: POLYPECTOMY;  Surgeon: Daneil Dolin, MD;  [facility];  Service: Endoscopy;;   TOTAL HIP ARTHROPLASTY Left 1997    Family History  Problem Relation Age of Onset   Heart disease Mother    Thrombocytopenia Mother        TTP   Heart failure  Father    Kidney disease Paternal Grandfather    Colon cancer Neg Hx     Social History   Socioeconomic History   Marital status: Married    Spouse name: Not on file   Number of children: 2   Years of education: Not on file   Highest education level: Not on file  Occupational History   Not on file  Tobacco Use   Smoking status: Passive Smoke Exposure - Never Smoker   Smokeless tobacco: Never Used  Vaping Use   Vaping Use: Never used  Substance and Sexual Activity   Alcohol use: Never   Drug use: Never   Sexual activity: Yes    Birth control/protection: I.U.D.    Comment: Mirena IUD  Other  Topics Concern   Not on file  Social History Narrative   Not on file   Social Determinants of Health   Financial Resource Strain: Not on file  Food Insecurity: Not on file  Transportation Needs: Not on file  Physical Activity: Not on file  Stress: Not on file  Social Connections: Not on file  Intimate Partner Violence: Not on file    Outpatient Medications Prior to Visit  Medication Sig Dispense Refill   amLODipine (NORVASC) 10 MG tablet Take 1 tablet (10 mg total) by mouth daily. 90 tablet 3   aspirin EC 81 MG tablet Take 1 tablet (81 mg total) by mouth daily.     B Complex-C-Folic Acid (RENA-VITE PO) Take 1 tablet by mouth daily.     Blood Glucose Monitoring Suppl (ACCU-CHEK GUIDE) w/Device KIT 1 Piece by Does not apply route as directed. 1 kit 0   calcium acetate (PHOSLO) 667 MG capsule Take by mouth.     cetirizine (ZYRTEC) 10 MG tablet Take 1 tablet (10 mg total) by mouth daily. 30 tablet 1   glucose blood (ACCU-CHEK GUIDE) test strip Use as instructed 150 each 2   insulin glargine, 2 Unit Dial, (TOUJEO MAX SOLOSTAR) 300 UNIT/ML Solostar Pen Inject 10 Units into the skin daily.     losartan (COZAAR) 100 MG tablet Take 1 tablet (100 mg total) by mouth daily. 90 tablet 3   cloNIDine (CATAPRES) 0.1 MG tablet TAKE (1) TABLET BY MOUTH TWICE DAILY. 90 tablet 0   acetaminophen (TYLENOL) 325 MG tablet Take 650 mg by mouth every 6 (six) hours as needed (pain).  (Patient not taking: Reported on 06/09/2020)     calcitRIOL (ROCALTROL) 0.5 MCG capsule Take 1-2 capsules (0.5-1 mcg total) by mouth See admin instructions. 0.32mg on Mondays through Fridays and take 15m on Saturdays and Sundays (Patient not taking: Reported on 06/09/2020) 90 capsule 1   cholestyramine (QUESTRAN) 4 g packet Take 0.5 packets (2 g total) by mouth daily. Do not take within 2 hours of other medications. (Patient not taking: No sig reported) 30 each 3   HYDROcodone-acetaminophen (NORCO/VICODIN) 5-325 MG  tablet Take 1 tablet by mouth every 8 (eight) hours as needed. (Patient not taking: No sig reported)     labetalol (NORMODYNE) 300 MG tablet Take 300 mg by mouth 2 (two) times daily.  (Patient not taking: No sig reported)     psyllium (METAMUCIL) 58.6 % packet Take by mouth. (Patient not taking: No sig reported)     ferrous sulfate tablet 325 mg      No facility-administered medications prior to visit.    No Known Allergies  ROS Review of Systems  Constitutional: Positive for fatigue. Negative for chills and fever.  HENT: Negative for congestion, sinus pressure, sinus pain and sore throat.   Eyes: Negative for pain and discharge.  Respiratory: Negative for cough and shortness of breath.   Cardiovascular: Negative for chest pain and palpitations.  Gastrointestinal: Negative for abdominal pain, constipation, diarrhea, nausea and vomiting.  Endocrine: Negative for polydipsia and polyuria.  Genitourinary: Negative for dysuria and hematuria.  Musculoskeletal: Negative for neck pain and neck stiffness.  Skin: Negative for rash.  Neurological: Positive for headaches. Negative for dizziness and weakness.  Psychiatric/Behavioral: Negative for agitation and behavioral problems.      Objective:    Physical Exam Vitals reviewed.  Constitutional:      General: She is not in acute distress.    Appearance: She is not diaphoretic.  HENT:     Head: Normocephalic and atraumatic.     Nose: Nose normal. No congestion.     Mouth/Throat:     Mouth: Mucous membranes are moist.     Pharynx: No posterior oropharyngeal erythema.  Eyes:     General: No scleral icterus.    Extraocular Movements: Extraocular movements intact.     Pupils: Pupils are equal, round, and reactive to light.  Cardiovascular:     Rate and Rhythm: Normal rate and regular rhythm.     Pulses: Normal pulses.     Heart sounds: Normal heart sounds. No murmur heard.   Pulmonary:     Breath sounds: Normal breath sounds. No  wheezing or rales.  Musculoskeletal:     Cervical back: Neck supple. No tenderness.     Right lower leg: No edema.     Left lower leg: No edema.  Skin:    General: Skin is warm.     Findings: No rash.  Neurological:     General: No focal deficit present.     Mental Status: She is alert and oriented to person, place, and time.  Psychiatric:        Mood and Affect: Mood normal.        Behavior: Behavior normal.     BP (!) 153/86 (BP Location: Right Arm, Patient Position: Sitting, Cuff Size: Normal)    Pulse 80    Resp 18    Ht '5\' 8"'  (1.727 m)    Wt 219 lb 1.9 oz (99.4 kg)    SpO2 94%    BMI 33.32 kg/m  Wt Readings from Last 3 Encounters:  06/09/20 219 lb 1.9 oz (99.4 kg)  03/03/20 230 lb 8 oz (104.6 kg)  02/18/20 228 lb (103.4 kg)     Health Maintenance Due  Topic Date Due   Hepatitis C Screening  Never done   PNEUMOCOCCAL POLYSACCHARIDE VACCINE AGE 68-64 HIGH RISK  Never done   FOOT EXAM  Never done   OPHTHALMOLOGY EXAM  Never done   TETANUS/TDAP  Never done   MAMMOGRAM  Never done   COVID-19 Vaccine (3 - Booster for Moderna series) 02/28/2020    There are no preventive care reminders to display for this patient.  No results found for: TSH Lab Results  Component Value Date   WBC 4.8 10/17/2019   HGB 11.2 (L) 10/17/2019   HCT 33.0 (L) 10/17/2019   MCV 77.0 (L) 10/17/2019   PLT 167 10/17/2019   Lab Results  Component Value Date   NA 138 10/17/2019   K 4.7 10/17/2019   CO2 30 08/29/2019   GLUCOSE 221 (H) 10/17/2019   BUN 42 (H) 10/17/2019   CREATININE 4.70 (H) 10/17/2019   BILITOT  0.5 08/29/2019   ALKPHOS 63 07/30/2019   AST 16 08/29/2019   ALT 15 08/29/2019   PROT 8.1 08/29/2019   ALBUMIN 2.4 (L) 07/30/2019   CALCIUM 9.2 08/29/2019   ANIONGAP 11 07/30/2019   Lab Results  Component Value Date   CHOL 114 08/29/2019   Lab Results  Component Value Date   HDL 35 (L) 08/29/2019   Lab Results  Component Value Date   LDLCALC 62 08/29/2019   Lab  Results  Component Value Date   TRIG 91 08/29/2019   Lab Results  Component Value Date   CHOLHDL 3.3 08/29/2019   Lab Results  Component Value Date   HGBA1C 6.4 06/09/2020      Assessment & Plan:   Problem List Items Addressed This Visit      Cardiovascular and Mediastinum   Essential hypertension    BP: 153/86 On Amlodipine, Losartan Ran out of Labetalol Continue Clonidine 0.1 mg BID Advised to increase activity DASH diet advised, weight loss would be beneficial      Relevant Medications   labetalol (NORMODYNE) 300 MG tablet   cloNIDine (CATAPRES) 0.1 MG tablet     Endocrine   Type 2 diabetes mellitus with ESRD (end-stage renal disease) ([place])    HbA1C: 6.4 Uses Toujeo at nighttime Follows up with Nephrologist for ESRD on HD On kidney [facility] list at [facility]      Relevant Orders   POCT glycosylated hemoglobin (Hb A1C) (Completed)    Other Visit Diagnoses    Screening mammogram for breast cancer    -  Primary   Relevant Orders   MM 3D SCREEN BREAST BILATERAL      Meds ordered this encounter  Medications   labetalol (NORMODYNE) 300 MG tablet    Sig: Take 1 tablet (300 mg total) by mouth 2 (two) times daily.    Dispense:  60 tablet    Refill:  2   cloNIDine (CATAPRES) 0.1 MG tablet    Sig: TAKE (1) TABLET BY MOUTH TWICE DAILY.    Dispense:  60 tablet    Refill:  2    Follow-up: Return in about 4 months (around 10/09/2020) for  DM and HTN.    Lindell Spar, MD

## 2020-06-09 NOTE — Assessment & Plan Note (Signed)
BP: 153/86 On Amlodipine, Losartan Ran out of Labetalol Continue Clonidine 0.1 mg BID Advised to increase activity DASH diet advised, weight loss would be beneficial

## 2020-06-09 NOTE — Patient Instructions (Signed)
Please continue to take medications as prescribed.  Please contact us if you have trouble obtaining your medication from Pharmacy.  Please start taking statin as prescribed by your Nephrologist.  Continue to check blood glucose and bring the log in the next visit.

## 2020-06-10 ENCOUNTER — Ambulatory Visit (INDEPENDENT_AMBULATORY_CARE_PROVIDER_SITE_OTHER): Payer: Medicare Other

## 2020-06-10 VITALS — Ht 68.0 in | Wt 219.0 lb

## 2020-06-10 DIAGNOSIS — N2581 Secondary hyperparathyroidism of renal origin: Secondary | ICD-10-CM | POA: Diagnosis not present

## 2020-06-10 DIAGNOSIS — N186 End stage renal disease: Secondary | ICD-10-CM | POA: Diagnosis not present

## 2020-06-10 DIAGNOSIS — Z0000 Encounter for general adult medical examination without abnormal findings: Secondary | ICD-10-CM

## 2020-06-10 DIAGNOSIS — D631 Anemia in chronic kidney disease: Secondary | ICD-10-CM | POA: Diagnosis not present

## 2020-06-10 DIAGNOSIS — D509 Iron deficiency anemia, unspecified: Secondary | ICD-10-CM | POA: Diagnosis not present

## 2020-06-10 DIAGNOSIS — Z992 Dependence on renal dialysis: Secondary | ICD-10-CM | POA: Diagnosis not present

## 2020-06-10 NOTE — Progress Notes (Signed)
Subjective:   Janet Mitchell is a 55 y.o. female who presents for Medicare Annual (Subsequent) preventive examination.  Review of Systems       Objective:    There were no vitals filed for this visit. There is no height or weight on file to calculate BMI.  Advanced Directives 10/17/2019 07/29/2019 07/27/2019 07/26/2019 07/22/2019 07/10/2019 04/04/2019  Does Patient Have a Medical Advance Directive? No No - No No No No  Would patient like information on creating a medical advance directive? No - Patient declined No - Patient declined No - Patient declined - No - Patient declined No - Patient declined No - Patient declined    Current Medications (verified) Outpatient Encounter Medications as of 06/10/2020  Medication Sig  . acetaminophen (TYLENOL) 325 MG tablet Take 650 mg by mouth every 6 (six) hours as needed (pain).  (Patient not taking: Reported on 06/09/2020)  . amLODipine (NORVASC) 10 MG tablet Take 1 tablet (10 mg total) by mouth daily.  Marland Kitchen aspirin EC 81 MG tablet Take 1 tablet (81 mg total) by mouth daily.  . B Complex-C-Folic Acid (RENA-VITE PO) Take 1 tablet by mouth daily.  . Blood Glucose Monitoring Suppl (ACCU-CHEK GUIDE) w/Device KIT 1 Piece by Does not apply route as directed.  . calcitRIOL (ROCALTROL) 0.5 MCG capsule Take 1-2 capsules (0.5-1 mcg total) by mouth See admin instructions. 0.74mg on Mondays through Fridays and take 150m on Saturdays and Sundays (Patient not taking: Reported on 06/09/2020)  . calcium acetate (PHOSLO) 667 MG capsule Take by mouth.  . cetirizine (ZYRTEC) 10 MG tablet Take 1 tablet (10 mg total) by mouth daily.  . cloNIDine (CATAPRES) 0.1 MG tablet TAKE (1) TABLET BY MOUTH TWICE DAILY.  . Marland Kitchenlucose blood (ACCU-CHEK GUIDE) test strip Use as instructed  . insulin glargine, 2 Unit Dial, (TOUJEO MAX SOLOSTAR) 300 UNIT/ML Solostar Pen Inject 10 Units into the skin daily.  . Marland Kitchenabetalol (NORMODYNE) 300 MG tablet Take 1 tablet (300 mg total) by mouth 2 (two)  times daily.  . Marland Kitchenosartan (COZAAR) 100 MG tablet Take 1 tablet (100 mg total) by mouth daily.   No facility-administered encounter medications on file as of 06/10/2020.    Allergies (verified) Patient has no known allergies.   History: Past Medical History:  Diagnosis Date  . Abdominal wall hematoma 12/26/2019  . Anemia   . Arthritis   . [place]   pt states she has been cleared of heart failure/disease  . Chronic cholecystitis with calculus   . Chronic kidney disease    Phreesia 12/12/2019  . COVID-19 03/26/2019  . COVID-19 virus infection 03/2019  . [facility]  . Headache    "a few/wk" (03/09/2018) - no longer having these  . History of blood transfusion 10/2017   "low blood count" (03/09/2018)  . Hypertension   . Seasonal allergies   . Spinal headache   . Type II diabetes mellitus ([facility]   Past Surgical History:  Procedure Laterality Date  . AMPUTATION TOE Left 2013   Great toe  . AV FISTULA PLACEMENT Left 07/22/2019   Procedure: LEFT ARM BASILIC ARTERIOVENOUS (AV) FISTULA CREATION;  Surgeon: EaRosetta PosnerMD;  [place] [facility] [place] Service: [place];  Laterality: Left;  . BASCILIC VEIN TRANSPOSITION Left 10/17/2019   Procedure: LEFT SECOND STAGE BASCILIC VEIN TRANSPOSITION;  Surgeon: EaRosetta PosnerMD;  [place] [facility] [place];  Laterality: Left;  . BIOPSY  10/24/2018   Procedure: BIOPSY;  Surgeon: Daneil Dolin, MD;  [facility];  Service: Endoscopy;;  right and left colon  . CATARACT EXTRACTION W/ INTRAOCULAR LENS IMPLANT Right   . CESAREAN SECTION  1994; 1998  . CHOLECYSTECTOMY N/A 03/09/2018   Procedure: LAPAROSCOPIC CHOLECYSTECTOMY WITH INTRAOPERATIVE CHOLANGIOGRAM ERAS PATHWAY;  Surgeon: Donnie Mesa, MD;  Location: [place];  Service: General;  Laterality: N/A;  . COLONOSCOPY N/A 10/24/2018   Procedure: COLONOSCOPY;  Surgeon: Daneil Dolin, MD;  [facility];  Service: Endoscopy;  Laterality: N/A;  1:00pm  . EYE SURGERY Left 05/15/2018   Removal of blood in the globe (due to DM)  . FLEXIBLE SIGMOIDOSCOPY N/A 11/24/2017   Procedure: FLEXIBLE SIGMOIDOSCOPY;  Surgeon: Daneil Dolin, MD;  [facility];  Service: Endoscopy;  Laterality: N/A;  . IR FLUORO GUIDE CV LINE RIGHT  07/17/2019  . IR PARACENTESIS  07/09/2019  . IR US GUIDE VASC ACCESS RIGHT  07/17/2019  . LAPAROSCOPIC CHOLECYSTECTOMY  03/09/2018  . PERITONEAL CATHETER INSERTION  2017  . POLYPECTOMY  10/24/2018   Procedure: POLYPECTOMY;  Surgeon: Daneil Dolin, MD;  [facility];  Service: Endoscopy;;  . TOTAL HIP ARTHROPLASTY Left 1997   Family History  Problem Relation Age of Onset  . Heart disease Mother   . Thrombocytopenia Mother        TTP  . Heart failure Father   . Kidney disease Paternal Grandfather   . Colon cancer Neg Hx    Social History   Socioeconomic History  . Marital status: Married    Spouse name: Not on file  . Number of children: 2  . Years of education: Not on file  . Highest education level: Not on file  Occupational History  . Not on file  Tobacco Use  . Smoking status: Passive Smoke Exposure - Never Smoker  . Smokeless tobacco: Never Used  Vaping Use  . Vaping Use: Never used  Substance and Sexual Activity  . Alcohol use: Never  . Drug use: Never  . Sexual activity: Yes    Birth control/protection: I.U.D.    Comment: Mirena IUD  Other Topics Concern  . Not on file  Social History Narrative  . Not on file   Social Determinants of Health   Financial Resource Strain: Not on file  Food Insecurity: Not on file  Transportation Needs: Not on file  Physical Activity: Not on file  Stress: Not on file  Social Connections: Not on file    Tobacco Counseling Counseling given: Not Answered   Clinical Intake:                 Diabetic? yes         Activities of Daily Living In your present state  of health, do you have any difficulty performing the following activities: 10/17/2019 10/17/2019  Hearing? - N  Vision? - N  Comment - -  Difficulty concentrating or making decisions? - N  Walking or climbing stairs? - Y  Dressing or bathing? - N  Doing errands, shopping? N -  Some recent data might be hidden    Patient Care Team: Lindell Spar, MD as PCP - General (Internal Medicine) Gala Romney Cristopher Estimable, MD as Consulting Physician (Gastroenterology) Dialysis, Theressa Stamps  Indicate any recent Medical Services you may have received from other than [facility] providers in the past year (date may be approximate).  Assessment:   This is a routine wellness examination for [facility].  Hearing/Vision screen No exam data present  Dietary issues and exercise activities discussed:    Goals   None    Depression Screen PHQ 2/9 Scores 06/09/2020 02/18/2020 01/29/2020 12/12/2019 10/24/2019 08/29/2019 05/08/2018  PHQ - 2 Score 0 2 6 0 1 2 0  PHQ- 9 Score - _0 - 15 -    Fall Risk Fall Risk  06/09/2020 02/18/2020 01/29/2020 12/12/2019 10/24/2019  Falls in the past year? 0 0 _1 Number falls in past yr: 0 0 _2 Injury with Fall? 0 0 _3 Risk for fall due to : No Fall Risks No Fall Risks No Fall Risks;Impaired balance/gait - -  Follow up Falls evaluation completed Falls evaluation completed Falls evaluation completed - -    FALL RISK PREVENTION PERTAINING TO THE HOME:  Any stairs in or around the home? No  If so, are there any without handrails? n/a Home free of loose throw rugs in walkways, pet beds, electrical cords, etc? Yes  Adequate lighting in your home to reduce risk of falls? Yes   ASSISTIVE DEVICES UTILIZED TO PREVENT FALLS:  Life alert? No  Use of a cane, walker or w/c? No  Grab bars in the bathroom? No  Shower chair or bench in shower? Yes  Elevated toilet seat or a handicapped toilet? No   TIMED UP AND GO:  Was the test performed? No .  Length of time to  ambulate n/a     Cognitive Function:        Immunizations Immunization History  Administered Date(s) Administered  . Hepatitis B, ped/adol 11/16/2015, 12/11/2015, 01/08/2016, 02/26/2016  . Influenza Split 05/27/2015, 12/11/2015, 01/04/2016, 01/27/2017, 01/15/2018, 01/03/2019  . Influenza,inj,Quad PF,6-35 Mos 12/27/2019  . Influenza-Unspecified 12/26/2019  . Moderna Sars-Covid-2 Vaccination 06/08/2019, 08/29/2019  . PPD Test 05/03/2016, 05/05/2017, 05/14/2018, 01/10/2020  . Pneumococcal Conjugate-13 05/27/2015, 04/06/2018    TDAP status: Due, Education has been provided regarding the importance of this vaccine. Advised may receive this vaccine at local pharmacy or Health Dept. Aware to provide a copy of the vaccination record if obtained from local pharmacy or Health Dept. Verbalized acceptance and understanding.  Flu Vaccine status: Up to date  Pneumococcal vaccine status: Declined,  Education has been provided regarding the importance of this vaccine but patient still declined. Advised may receive this vaccine at local pharmacy or Health Dept. Aware to provide a copy of the vaccination record if obtained from local pharmacy or Health Dept. Verbalized acceptance and understanding.   Covid-19 vaccine status: Completed vaccines  Qualifies for Shingles Vaccine? Yes   Zostavax completed No   Shingrix Completed?: No.    Education has been provided regarding the importance of this vaccine. Patient has been advised to call insurance company to determine out of pocket expense if they have not yet received this vaccine. Advised may also receive vaccine at local pharmacy or Health Dept. Verbalized acceptance and understanding.  Screening Tests Health Maintenance  Topic Date Due  . Hepatitis C Screening  Never done  . PNEUMOCOCCAL POLYSACCHARIDE VACCINE AGE 48-64 HIGH RISK  Never done  . OPHTHALMOLOGY EXAM  Never done  . TETANUS/TDAP  Never done  . MAMMOGRAM  Never done  . COVID-19  Vaccine (3 - Booster for Moderna series) 02/28/2020  . HEMOGLOBIN A1C  12/10/2020  . PAP SMEAR-Modifier  05/08/2021  . FOOT EXAM  06/09/2021  . COLONOSCOPY (Pts 45-6yr  Insurance coverage will need to be confirmed)  10/23/2028  . INFLUENZA VACCINE  Completed  . HIV Screening  Completed  . HPV VACCINES  Aged Out    Health Maintenance  Health Maintenance Due  Topic Date Due  . Hepatitis C Screening  Never done  . PNEUMOCOCCAL POLYSACCHARIDE VACCINE AGE 78-64 HIGH RISK  Never done  . OPHTHALMOLOGY EXAM  Never done  . TETANUS/TDAP  Never done  . MAMMOGRAM  Never done  . COVID-19 Vaccine (3 - Booster for Moderna series) 02/28/2020    Colorectal cancer screening: Type of screening: Colonoscopy. Completed 10/24/2018. Repeat every 10 years  Mammogram status: Completed 06/18/20. Repeat every year    Lung Cancer Screening: (Low Dose CT Chest recommended if Age 68-80 years, 30 pack-year currently smoking OR have quit w/in 15years.) does not qualify.   Lung Cancer Screening Referral: /a  Additional Screening:  Hepatitis C Screening: does not qualify; Completed  Vision Screening: Recommended annual ophthalmology exams for early detection of glaucoma and other disorders of the eye. Is the patient up to date with their annual eye exam?  Yes  Who is the provider or what is the name of the office in which the patient attends annual eye exams? [facility] If pt is not established with a provider, would they like to be referred to a provider to establish care? n/a.   Dental Screening: Recommended annual dental exams for proper oral hygiene  Community Resource Referral / Chronic Care Management: CRR required this visit?  No   CCM required this visit?  No      Plan:     I have personally reviewed and noted the following in the patient's chart:   . Medical and social history . Use of alcohol, tobacco or illicit drugs  . Current medications and supplements . Functional  ability and status . Nutritional status . Physical activity . Advanced directives . List of other physicians . Hospitalizations, surgeries, and ER visits in previous 12 months . Vitals . Screenings to include cognitive, depression, and falls . Referrals and appointments  In addition, I have reviewed and discussed with patient certain preventive protocols, quality metrics, and best practice recommendations. A written personalized care plan for preventive services as well as general preventive health recommendations were provided to patient.     Laretta Bolster, [place]   [id number]   Nurse Notes: AWV conducted by nurse in office by phone. Patient gave consent to teleheatlth visit via audio. Patient at home at the time of this visit. Provider here in the office at the time of this visit. Visit took 30 minutes to complete.

## 2020-06-10 NOTE — Patient Instructions (Addendum)
[name] , Thank you for taking time to come for your Medicare Wellness Visit. I appreciate your ongoing commitment to your health goals. Please review the following plan we discussed and let me know if I can assist you in the future.   Screening recommendations/referrals: Colonoscopy: 10/23/2028 Mammogram: 06/18/20 Bone Density: Age 55 Recommended yearly ophthalmology/optometry visit for glaucoma screening and checkup Recommended yearly dental visit for hygiene and checkup  Vaccinations: Influenza vaccine: Fall 2022 Pneumococcal vaccine: Declines at this time Tdap vaccine: Declines at this time Shingles vaccine: [place]  Advanced directives: No  Conditions/risks identified: None  Next appointment: 10/08/20 @ 1:00 pm   Preventive Care 40-64 Years, Female Preventive care refers to lifestyle choices and visits with your health care provider that can promote health and wellness. What does preventive care include?  A yearly physical exam. This is also called an annual well check.  Dental exams once or twice a year.  Routine eye exams. Ask your health care provider how often you should have your eyes checked.  Personal lifestyle choices, including:  Daily care of your teeth and gums.  Regular physical activity.  Eating a healthy diet.  Avoiding tobacco and drug use.  Limiting alcohol use.  Practicing safe sex.  Taking low-dose aspirin daily starting at age 63.  Taking vitamin and mineral supplements as recommended by your health care provider. What happens during an annual well check? The services and screenings done by your health care provider during your annual well check will depend on your age, overall health, lifestyle risk factors, and family history of disease. Counseling  Your health care provider may ask you questions about your:  Alcohol use.  Tobacco use.  Drug use.  Emotional well-being.  Home and relationship well-being.  Sexual  activity.  Eating habits.  Work and work Statistician.  Method of birth control.  Menstrual cycle.  Pregnancy history. Screening  You may have the following tests or measurements:  Height, weight, and BMI.  Blood pressure.  Lipid and cholesterol levels. These may be checked every 5 years, or more frequently if you are over 18 years old.  Skin check.  Lung cancer screening. You may have this screening every year starting at age 23 if you have a 30-pack-year history of smoking and currently smoke or have quit within the past 15 years.  Fecal occult blood test (FOBT) of the stool. You may have this test every year starting at age 26.  Flexible sigmoidoscopy or colonoscopy. You may have a sigmoidoscopy every 5 years or a colonoscopy every 10 years starting at age 57.  Hepatitis C blood test.  Hepatitis B blood test.  Sexually transmitted disease (STD) testing.  Diabetes screening. This is done by checking your blood sugar (glucose) after you have not eaten for a while (fasting). You may have this done every 1-3 years.  Mammogram. This may be done every 1-2 years. Talk to your health care provider about when you should start having regular mammograms. This may depend on whether you have a family history of breast cancer.  BRCA-related cancer screening. This may be done if you have a family history of breast, ovarian, tubal, or peritoneal cancers.  Pelvic exam and Pap test. This may be done every 3 years starting at age 11. Starting at age 40, this may be done every 5 years if you have a Pap test in combination with an HPV test.  Bone density scan. This is done to screen for osteoporosis. You may  have this scan if you are at high risk for osteoporosis. Discuss your test results, treatment options, and if necessary, the need for more tests with your health care provider. Vaccines  Your health care provider may recommend certain vaccines, such as:  Influenza vaccine. This is  recommended every year.  Tetanus, diphtheria, and acellular pertussis (Tdap, Td) vaccine. You may need a Td booster every 10 years.  Zoster vaccine. You may need this after age 96.  Pneumococcal 13-valent conjugate (PCV13) vaccine. You may need this if you have certain conditions and were not previously vaccinated.  Pneumococcal polysaccharide (PPSV23) vaccine. You may need one or two doses if you smoke cigarettes or if you have certain conditions. Talk to your health care provider about which screenings and vaccines you need and how often you need them. This information is not intended to replace advice given to you by your health care provider. Make sure you discuss any questions you have with your health care provider. Document Released: 04/10/2015 Document Revised: 12/02/2015 Document Reviewed: 01/13/2015 Elsevier Interactive Patient Education  2017 [place] Prevention in the Home Falls can cause injuries. They can happen to people of all ages. There are many things you can do to make your home safe and to help prevent falls. What can I do on the outside of my home?  Regularly fix the edges of walkways and driveways and fix any cracks.  Remove anything that might make you trip as you walk through a door, such as a raised step or threshold.  Trim any bushes or trees on the path to your home.  Use bright outdoor lighting.  Clear any walking paths of anything that might make someone trip, such as rocks or tools.  Regularly check to see if handrails are loose or broken. Make sure that both sides of any steps have handrails.  Any raised decks and porches should have guardrails on the edges.  Have any leaves, snow, or ice cleared regularly.  Use sand or salt on walking paths during winter.  Clean up any spills in your garage right away. This includes oil or grease spills. What can I do in the bathroom?  Use night lights.  Install grab bars by the toilet and in  the tub and shower. Do not use towel bars as grab bars.  Use non-skid mats or decals in the tub or shower.  If you need to sit down in the shower, use a plastic, non-slip stool.  Keep the floor dry. Clean up any water that spills on the floor as soon as it happens.  Remove soap buildup in the tub or shower regularly.  Attach bath mats securely with double-sided non-slip rug tape.  Do not have throw rugs and other things on the floor that can make you trip. What can I do in the bedroom?  Use night lights.  Make sure that you have a light by your bed that is easy to reach.  Do not use any sheets or blankets that are too big for your bed. They should not hang down onto the floor.  Have a firm chair that has side arms. You can use this for support while you get dressed.  Do not have throw rugs and other things on the floor that can make you trip. What can I do in the kitchen?  Clean up any spills right away.  Avoid walking on wet floors.  Keep items that you use a lot in easy-to-reach  places.  If you need to reach something above you, use a strong step stool that has a grab bar.  Keep electrical cords out of the way.  Do not use floor polish or wax that makes floors slippery. If you must use wax, use non-skid floor wax.  Do not have throw rugs and other things on the floor that can make you trip. What can I do with my stairs?  Do not leave any items on the stairs.  Make sure that there are handrails on both sides of the stairs and use them. Fix handrails that are broken or loose. Make sure that handrails are as long as the stairways.  Check any carpeting to make sure that it is firmly attached to the stairs. Fix any carpet that is loose or worn.  Avoid having throw rugs at the top or bottom of the stairs. If you do have throw rugs, attach them to the floor with carpet tape.  Make sure that you have a light switch at the top of the stairs and the bottom of the stairs. If  you do not have them, ask someone to add them for you. What else can I do to help prevent falls?  Wear shoes that:  Do not have high heels.  Have rubber bottoms.  Are comfortable and fit you well.  Are closed at the toe. Do not wear sandals.  If you use a stepladder:  Make sure that it is fully opened. Do not climb a closed stepladder.  Make sure that both sides of the stepladder are locked into place.  Ask someone to hold it for you, if possible.  Clearly mark and make sure that you can see:  Any grab bars or handrails.  First and last steps.  Where the edge of each step is.  Use tools that help you move around (mobility aids) if they are needed. These include:  Canes.  Walkers.  Scooters.  Crutches.  Turn on the lights when you go into a dark area. Replace any light bulbs as soon as they burn out.  Set up your furniture so you have a clear path. Avoid moving your furniture around.  If any of your floors are uneven, fix them.  If there are any pets around you, be aware of where they are.  Review your medicines with your doctor. Some medicines can make you feel dizzy. This can increase your chance of falling. Ask your doctor what other things that you can do to help prevent falls. This information is not intended to replace advice given to you by your health care provider. Make sure you discuss any questions you have with your health care provider. Document Released: 01/08/2009 Document Revised: 08/20/2015 Document Reviewed: 04/18/2014 Elsevier Interactive Patient Education  2017 [employer].

## 2020-06-12 DIAGNOSIS — D509 Iron deficiency anemia, unspecified: Secondary | ICD-10-CM | POA: Diagnosis not present

## 2020-06-12 DIAGNOSIS — N186 End stage renal disease: Secondary | ICD-10-CM | POA: Diagnosis not present

## 2020-06-12 DIAGNOSIS — N2581 Secondary hyperparathyroidism of renal origin: Secondary | ICD-10-CM | POA: Diagnosis not present

## 2020-06-12 DIAGNOSIS — D631 Anemia in chronic kidney disease: Secondary | ICD-10-CM | POA: Diagnosis not present

## 2020-06-12 DIAGNOSIS — Z992 Dependence on renal dialysis: Secondary | ICD-10-CM | POA: Diagnosis not present

## 2020-06-15 DIAGNOSIS — N2581 Secondary hyperparathyroidism of renal origin: Secondary | ICD-10-CM | POA: Diagnosis not present

## 2020-06-15 DIAGNOSIS — N186 End stage renal disease: Secondary | ICD-10-CM | POA: Diagnosis not present

## 2020-06-15 DIAGNOSIS — D509 Iron deficiency anemia, unspecified: Secondary | ICD-10-CM | POA: Diagnosis not present

## 2020-06-15 DIAGNOSIS — D631 Anemia in chronic kidney disease: Secondary | ICD-10-CM | POA: Diagnosis not present

## 2020-06-15 DIAGNOSIS — Z992 Dependence on renal dialysis: Secondary | ICD-10-CM | POA: Diagnosis not present

## 2020-06-17 DIAGNOSIS — N186 End stage renal disease: Secondary | ICD-10-CM | POA: Diagnosis not present

## 2020-06-17 DIAGNOSIS — D631 Anemia in chronic kidney disease: Secondary | ICD-10-CM | POA: Diagnosis not present

## 2020-06-17 DIAGNOSIS — N2581 Secondary hyperparathyroidism of renal origin: Secondary | ICD-10-CM | POA: Diagnosis not present

## 2020-06-17 DIAGNOSIS — D509 Iron deficiency anemia, unspecified: Secondary | ICD-10-CM | POA: Diagnosis not present

## 2020-06-17 DIAGNOSIS — Z992 Dependence on renal dialysis: Secondary | ICD-10-CM | POA: Diagnosis not present

## 2020-06-18 ENCOUNTER — Ambulatory Visit (HOSPITAL_COMMUNITY): Payer: Medicare Other

## 2020-06-19 DIAGNOSIS — N2581 Secondary hyperparathyroidism of renal origin: Secondary | ICD-10-CM | POA: Diagnosis not present

## 2020-06-19 DIAGNOSIS — D631 Anemia in chronic kidney disease: Secondary | ICD-10-CM | POA: Diagnosis not present

## 2020-06-19 DIAGNOSIS — D509 Iron deficiency anemia, unspecified: Secondary | ICD-10-CM | POA: Diagnosis not present

## 2020-06-19 DIAGNOSIS — N186 End stage renal disease: Secondary | ICD-10-CM | POA: Diagnosis not present

## 2020-06-19 DIAGNOSIS — Z992 Dependence on renal dialysis: Secondary | ICD-10-CM | POA: Diagnosis not present

## 2020-06-22 DIAGNOSIS — N2581 Secondary hyperparathyroidism of renal origin: Secondary | ICD-10-CM | POA: Diagnosis not present

## 2020-06-22 DIAGNOSIS — Z992 Dependence on renal dialysis: Secondary | ICD-10-CM | POA: Diagnosis not present

## 2020-06-22 DIAGNOSIS — D509 Iron deficiency anemia, unspecified: Secondary | ICD-10-CM | POA: Diagnosis not present

## 2020-06-22 DIAGNOSIS — D631 Anemia in chronic kidney disease: Secondary | ICD-10-CM | POA: Diagnosis not present

## 2020-06-22 DIAGNOSIS — N186 End stage renal disease: Secondary | ICD-10-CM | POA: Diagnosis not present

## 2020-06-24 DIAGNOSIS — N2581 Secondary hyperparathyroidism of renal origin: Secondary | ICD-10-CM | POA: Diagnosis not present

## 2020-06-24 DIAGNOSIS — D509 Iron deficiency anemia, unspecified: Secondary | ICD-10-CM | POA: Diagnosis not present

## 2020-06-24 DIAGNOSIS — Z992 Dependence on renal dialysis: Secondary | ICD-10-CM | POA: Diagnosis not present

## 2020-06-24 DIAGNOSIS — N186 End stage renal disease: Secondary | ICD-10-CM | POA: Diagnosis not present

## 2020-06-24 DIAGNOSIS — D631 Anemia in chronic kidney disease: Secondary | ICD-10-CM | POA: Diagnosis not present

## 2020-06-25 ENCOUNTER — Other Ambulatory Visit: Payer: Self-pay

## 2020-06-25 ENCOUNTER — Encounter: Payer: Self-pay | Admitting: Nurse Practitioner

## 2020-06-25 ENCOUNTER — Ambulatory Visit (INDEPENDENT_AMBULATORY_CARE_PROVIDER_SITE_OTHER): Payer: Medicare Other | Admitting: Nurse Practitioner

## 2020-06-25 DIAGNOSIS — N186 End stage renal disease: Secondary | ICD-10-CM | POA: Diagnosis not present

## 2020-06-25 DIAGNOSIS — Z992 Dependence on renal dialysis: Secondary | ICD-10-CM | POA: Diagnosis not present

## 2020-06-25 DIAGNOSIS — I10 Essential (primary) hypertension: Secondary | ICD-10-CM

## 2020-06-25 MED ORDER — CLONIDINE HCL 0.2 MG PO TABS
ORAL_TABLET | ORAL | 1 refills | Status: DC
Start: 2020-06-25 — End: 2020-11-10

## 2020-06-25 NOTE — Assessment & Plan Note (Signed)
BP Readings from Last 3 Encounters:  06/25/20 (!) 185/99  06/09/20 (!) 153/86  03/03/20 (!) 160/89  -INCREASED clonidine to 0.2 mg BID -already taking max amlodipine, and she is taking labetalol and losartan as well

## 2020-06-25 NOTE — Progress Notes (Signed)
Acute Office Visit  Subjective:    Patient ID: Janet Mitchell, female    DOB: November 26, 1965, 55 y.o.   MRN: [id number]  Chief Complaint  Patient presents with  . Hypertension    HPI Patient is in today for elevated BP.  She is a ESRD patient who gets HD on MWF.  She had dialysis yesterday and her SBP was over 200.  Despite having dialysis yesterday her BP is still elevated.  Past Medical History:  Diagnosis Date  . Abdominal wall hematoma 12/26/2019  . Anemia   . Arthritis   . CHF (congestive heart failure) ([place])    pt states she has been cleared of heart failure/disease  . Chronic cholecystitis with calculus   . Chronic kidney disease    Phreesia 12/12/2019  . COVID-19 03/26/2019  . COVID-19 virus infection 03/2019  . ESRD (end stage renal disease) on dialysis [facility])    Dialysis M-W-F  . Headache    "a few/wk" (03/09/2018) - no longer having these  . History of blood transfusion 10/2017   "low blood count" (03/09/2018)  . Hypertension   . Seasonal allergies   . Spinal headache   . Type II diabetes mellitus ([place])     Past Surgical History:  Procedure Laterality Date  . AMPUTATION TOE Left 2013   Great toe  . AV FISTULA PLACEMENT Left 07/22/2019   Procedure: LEFT ARM BASILIC ARTERIOVENOUS (AV) FISTULA CREATION;  Surgeon: Rosetta Posner, MD;  Location: [place];  Service: Vascular;  Laterality: Left;  . BASCILIC VEIN TRANSPOSITION Left 10/17/2019   Procedure: LEFT SECOND STAGE BASCILIC VEIN TRANSPOSITION;  Surgeon: Rosetta Posner, MD;  Location: [place];  Service: Vascular;  Laterality: Left;  . BIOPSY  10/24/2018   Procedure: BIOPSY;  Surgeon: Daneil Dolin, MD;  [facility];  Service: Endoscopy;;  right and left colon  . CATARACT EXTRACTION W/ INTRAOCULAR LENS IMPLANT Right   . CESAREAN SECTION  1994; 1998  . CHOLECYSTECTOMY N/A 03/09/2018   Procedure: LAPAROSCOPIC CHOLECYSTECTOMY WITH INTRAOPERATIVE CHOLANGIOGRAM ERAS PATHWAY;  Surgeon: Donnie Mesa, MD;   Location: [place];  Service: General;  Laterality: N/A;  . COLONOSCOPY N/A 10/24/2018   Procedure: COLONOSCOPY;  Surgeon: Daneil Dolin, MD;  [facility];  Service: Endoscopy;  Laterality: N/A;  1:00pm  . EYE SURGERY Left 05/15/2018   Removal of blood in the globe (due to DM)  . FLEXIBLE SIGMOIDOSCOPY N/A 11/24/2017   Procedure: FLEXIBLE SIGMOIDOSCOPY;  Surgeon: Daneil Dolin, MD;  [facility];  Service: Endoscopy;  Laterality: N/A;  . IR FLUORO GUIDE CV LINE RIGHT  07/17/2019  . IR PARACENTESIS  07/09/2019  . IR US GUIDE VASC ACCESS RIGHT  07/17/2019  . LAPAROSCOPIC CHOLECYSTECTOMY  03/09/2018  . PERITONEAL CATHETER INSERTION  2017  . POLYPECTOMY  10/24/2018   Procedure: POLYPECTOMY;  Surgeon: Daneil Dolin, MD;  [facility];  Service: Endoscopy;;  . TOTAL HIP ARTHROPLASTY Left 1997    Family History  Problem Relation Age of Onset  . Heart disease Mother   . Thrombocytopenia Mother        TTP  . Heart failure Father   . Kidney disease Paternal Grandfather   . Colon cancer Neg Hx     Social History   Socioeconomic History  . Marital status: Married    Spouse name: Not on file  . Number of children: 2  . Years of education: Not on file  .  Highest education level: Not on file  Occupational History  . Not on file  Tobacco Use  . Smoking status: Passive Smoke Exposure - Never Smoker  . Smokeless tobacco: Never Used  Vaping Use  . Vaping Use: Never used  Substance and Sexual Activity  . Alcohol use: Never  . Drug use: Never  . Sexual activity: Yes    Birth control/protection: I.U.D.    Comment: Mirena IUD  Other Topics Concern  . Not on file  Social History Narrative  . Not on file   Social Determinants of Health   Financial Resource Strain: Low Risk   . Difficulty of Paying Living Expenses: Not hard at all  Food Insecurity: No Food Insecurity  . Worried About Charity fundraiser in the Last Year: Never true  . Ran Out of Food in  the Last Year: Never true  Transportation Needs: No Transportation Needs  . Lack of Transportation (Medical): No  . Lack of Transportation (Non-Medical): No  Physical Activity: Inactive  . Days of Exercise per Week: 0 days  . Minutes of Exercise per Session: 0 min  Stress: No Stress Concern Present  . Feeling of Stress : Not at all  Social Connections: Socially Integrated  . Frequency of Communication with Friends and Family: More than three times a week  . Frequency of Social Gatherings with Friends and Family: More than three times a week  . Attends Religious Services: More than 4 times per year  . Active Member of Clubs or Organizations: Yes  . Attends Archivist Meetings: More than 4 times per year  . Marital Status: Married  Human resources officer Violence: Not At Risk  . Fear of Current or Ex-Partner: No  . Emotionally Abused: No  . Physically Abused: No  . Sexually Abused: No    Outpatient Medications Prior to Visit  Medication Sig Dispense Refill  . acetaminophen (TYLENOL) 325 MG tablet Take 650 mg by mouth every 6 (six) hours as needed (pain).    Marland Kitchen amLODipine (NORVASC) 10 MG tablet Take 1 tablet (10 mg total) by mouth daily. 90 tablet 3  . aspirin EC 81 MG tablet Take 1 tablet (81 mg total) by mouth daily.    . B Complex-C-Folic Acid (RENA-VITE PO) Take 1 tablet by mouth daily.    . Blood Glucose Monitoring Suppl (ACCU-CHEK GUIDE) w/Device KIT 1 Piece by Does not apply route as directed. 1 kit 0  . calcitRIOL (ROCALTROL) 0.5 MCG capsule Take 1-2 capsules (0.5-1 mcg total) by mouth See admin instructions. 0.24mg on Mondays through Fridays and take 185m on Saturdays and Sundays 90 capsule 1  . calcium acetate (PHOSLO) 667 MG capsule Take by mouth.    . cetirizine (ZYRTEC) 10 MG tablet Take 1 tablet (10 mg total) by mouth daily. 30 tablet 1  . glucose blood (ACCU-CHEK GUIDE) test strip Use as instructed 150 each 2  . insulin glargine, 2 Unit Dial, (TOUJEO MAX SOLOSTAR) 300  UNIT/ML Solostar Pen Inject 10 Units into the skin daily.    . Marland Kitchenabetalol (NORMODYNE) 300 MG tablet Take 1 tablet (300 mg total) by mouth 2 (two) times daily. 60 tablet 2  . losartan (COZAAR) 100 MG tablet Take 1 tablet (100 mg total) by mouth daily. 90 tablet 3  . cloNIDine (CATAPRES) 0.1 MG tablet TAKE (1) TABLET BY MOUTH TWICE DAILY. 60 tablet 2   No facility-administered medications prior to visit.    No Known Allergies  Review of Systems  Constitutional:  Negative.   Respiratory: Negative.   Cardiovascular: Negative.        Objective:    Physical Exam Constitutional:      Appearance: Normal appearance.  Cardiovascular:     Rate and Rhythm: Normal rate and regular rhythm.     Pulses: Normal pulses.     Heart sounds: Normal heart sounds.  Pulmonary:     Effort: Pulmonary effort is normal.     Breath sounds: Normal breath sounds.  Neurological:     Mental Status: She is alert.     BP (!) 185/99   Pulse 66   Temp 98.3 F (36.8 C)   Resp 18   Ht '5\' 8"'  (1.727 m)   Wt 220 lb (99.8 kg)   SpO2 93%   BMI 33.45 kg/m  Wt Readings from Last 3 Encounters:  06/25/20 220 lb (99.8 kg)  06/10/20 219 lb (99.3 kg)  06/09/20 219 lb 1.9 oz (99.4 kg)    Health Maintenance Due  Topic Date Due  . MAMMOGRAM  Never done    There are no preventive care reminders to display for this patient.   No results found for: TSH Lab Results  Component Value Date   WBC 4.8 10/17/2019   HGB 11.2 (L) 10/17/2019   HCT 33.0 (L) 10/17/2019   MCV 77.0 (L) 10/17/2019   PLT 167 10/17/2019   Lab Results  Component Value Date   NA 138 10/17/2019   K 4.7 10/17/2019   CO2 30 08/29/2019   GLUCOSE 221 (H) 10/17/2019   BUN 42 (H) 10/17/2019   CREATININE 4.70 (H) 10/17/2019   BILITOT 0.5 08/29/2019   ALKPHOS 63 07/30/2019   AST 16 08/29/2019   ALT 15 08/29/2019   PROT 8.1 08/29/2019   ALBUMIN 2.4 (L) 07/30/2019   CALCIUM 9.2 08/29/2019   ANIONGAP 11 07/30/2019   Lab Results  Component  Value Date   CHOL 114 08/29/2019   Lab Results  Component Value Date   HDL 35 (L) 08/29/2019   Lab Results  Component Value Date   LDLCALC 62 08/29/2019   Lab Results  Component Value Date   TRIG 91 08/29/2019   Lab Results  Component Value Date   CHOLHDL 3.3 08/29/2019   Lab Results  Component Value Date   HGBA1C 6.4 06/09/2020       Assessment & Plan:   Problem List Items Addressed This Visit      Cardiovascular and Mediastinum   Essential hypertension   Relevant Medications   cloNIDine (CATAPRES) 0.2 MG tablet   Malignant hypertension    BP Readings from Last 3 Encounters:  06/25/20 (!) 185/99  06/09/20 (!) 153/86  03/03/20 (!) 160/89  -INCREASED clonidine to 0.2 mg BID -already taking max amlodipine, and she is taking labetalol and losartan as well      Relevant Medications   cloNIDine (CATAPRES) 0.2 MG tablet       Meds ordered this encounter  Medications  . cloNIDine (CATAPRES) 0.2 MG tablet    Sig: TAKE (1) TABLET BY MOUTH TWICE DAILY.    Dispense:  60 tablet    Refill:  1    Increased dose today     Noreene Larsson, NP

## 2020-06-29 DIAGNOSIS — N186 End stage renal disease: Secondary | ICD-10-CM | POA: Diagnosis not present

## 2020-06-29 DIAGNOSIS — N2581 Secondary hyperparathyroidism of renal origin: Secondary | ICD-10-CM | POA: Diagnosis not present

## 2020-06-29 DIAGNOSIS — Z992 Dependence on renal dialysis: Secondary | ICD-10-CM | POA: Diagnosis not present

## 2020-06-29 DIAGNOSIS — D631 Anemia in chronic kidney disease: Secondary | ICD-10-CM | POA: Diagnosis not present

## 2020-06-29 DIAGNOSIS — D509 Iron deficiency anemia, unspecified: Secondary | ICD-10-CM | POA: Diagnosis not present

## 2020-06-30 ENCOUNTER — Ambulatory Visit: Payer: Medicare Other | Admitting: Podiatry

## 2020-07-01 DIAGNOSIS — N186 End stage renal disease: Secondary | ICD-10-CM | POA: Diagnosis not present

## 2020-07-01 DIAGNOSIS — D509 Iron deficiency anemia, unspecified: Secondary | ICD-10-CM | POA: Diagnosis not present

## 2020-07-01 DIAGNOSIS — D631 Anemia in chronic kidney disease: Secondary | ICD-10-CM | POA: Diagnosis not present

## 2020-07-01 DIAGNOSIS — N2581 Secondary hyperparathyroidism of renal origin: Secondary | ICD-10-CM | POA: Diagnosis not present

## 2020-07-01 DIAGNOSIS — Z992 Dependence on renal dialysis: Secondary | ICD-10-CM | POA: Diagnosis not present

## 2020-07-02 ENCOUNTER — Ambulatory Visit: Payer: Medicare Other | Admitting: Internal Medicine

## 2020-07-03 DIAGNOSIS — Z992 Dependence on renal dialysis: Secondary | ICD-10-CM | POA: Diagnosis not present

## 2020-07-03 DIAGNOSIS — D631 Anemia in chronic kidney disease: Secondary | ICD-10-CM | POA: Diagnosis not present

## 2020-07-03 DIAGNOSIS — D509 Iron deficiency anemia, unspecified: Secondary | ICD-10-CM | POA: Diagnosis not present

## 2020-07-03 DIAGNOSIS — N186 End stage renal disease: Secondary | ICD-10-CM | POA: Diagnosis not present

## 2020-07-03 DIAGNOSIS — N2581 Secondary hyperparathyroidism of renal origin: Secondary | ICD-10-CM | POA: Diagnosis not present

## 2020-07-06 DIAGNOSIS — N186 End stage renal disease: Secondary | ICD-10-CM | POA: Diagnosis not present

## 2020-07-06 DIAGNOSIS — D631 Anemia in chronic kidney disease: Secondary | ICD-10-CM | POA: Diagnosis not present

## 2020-07-06 DIAGNOSIS — D509 Iron deficiency anemia, unspecified: Secondary | ICD-10-CM | POA: Diagnosis not present

## 2020-07-06 DIAGNOSIS — N2581 Secondary hyperparathyroidism of renal origin: Secondary | ICD-10-CM | POA: Diagnosis not present

## 2020-07-06 DIAGNOSIS — Z992 Dependence on renal dialysis: Secondary | ICD-10-CM | POA: Diagnosis not present

## 2020-07-08 DIAGNOSIS — D631 Anemia in chronic kidney disease: Secondary | ICD-10-CM | POA: Diagnosis not present

## 2020-07-08 DIAGNOSIS — N2581 Secondary hyperparathyroidism of renal origin: Secondary | ICD-10-CM | POA: Diagnosis not present

## 2020-07-08 DIAGNOSIS — Z992 Dependence on renal dialysis: Secondary | ICD-10-CM | POA: Diagnosis not present

## 2020-07-08 DIAGNOSIS — E119 Type 2 diabetes mellitus without complications: Secondary | ICD-10-CM | POA: Diagnosis not present

## 2020-07-08 DIAGNOSIS — D509 Iron deficiency anemia, unspecified: Secondary | ICD-10-CM | POA: Diagnosis not present

## 2020-07-08 DIAGNOSIS — Z794 Long term (current) use of insulin: Secondary | ICD-10-CM | POA: Diagnosis not present

## 2020-07-08 DIAGNOSIS — N186 End stage renal disease: Secondary | ICD-10-CM | POA: Diagnosis not present

## 2020-07-10 DIAGNOSIS — D509 Iron deficiency anemia, unspecified: Secondary | ICD-10-CM | POA: Diagnosis not present

## 2020-07-10 DIAGNOSIS — D631 Anemia in chronic kidney disease: Secondary | ICD-10-CM | POA: Diagnosis not present

## 2020-07-10 DIAGNOSIS — N186 End stage renal disease: Secondary | ICD-10-CM | POA: Diagnosis not present

## 2020-07-10 DIAGNOSIS — N2581 Secondary hyperparathyroidism of renal origin: Secondary | ICD-10-CM | POA: Diagnosis not present

## 2020-07-10 DIAGNOSIS — Z992 Dependence on renal dialysis: Secondary | ICD-10-CM | POA: Diagnosis not present

## 2020-07-10 DIAGNOSIS — E785 Hyperlipidemia, unspecified: Secondary | ICD-10-CM | POA: Diagnosis not present

## 2020-07-10 DIAGNOSIS — Z79899 Other long term (current) drug therapy: Secondary | ICD-10-CM | POA: Diagnosis not present

## 2020-07-10 DIAGNOSIS — Z5181 Encounter for therapeutic drug level monitoring: Secondary | ICD-10-CM | POA: Diagnosis not present

## 2020-07-13 DIAGNOSIS — Z992 Dependence on renal dialysis: Secondary | ICD-10-CM | POA: Diagnosis not present

## 2020-07-13 DIAGNOSIS — N186 End stage renal disease: Secondary | ICD-10-CM | POA: Diagnosis not present

## 2020-07-13 DIAGNOSIS — D631 Anemia in chronic kidney disease: Secondary | ICD-10-CM | POA: Diagnosis not present

## 2020-07-13 DIAGNOSIS — N2581 Secondary hyperparathyroidism of renal origin: Secondary | ICD-10-CM | POA: Diagnosis not present

## 2020-07-13 DIAGNOSIS — D509 Iron deficiency anemia, unspecified: Secondary | ICD-10-CM | POA: Diagnosis not present

## 2020-07-14 ENCOUNTER — Other Ambulatory Visit: Payer: Self-pay

## 2020-07-14 ENCOUNTER — Ambulatory Visit: Payer: Medicare Other | Admitting: Podiatry

## 2020-07-14 ENCOUNTER — Ambulatory Visit (INDEPENDENT_AMBULATORY_CARE_PROVIDER_SITE_OTHER): Payer: Medicare Other | Admitting: Nurse Practitioner

## 2020-07-14 ENCOUNTER — Encounter: Payer: Self-pay | Admitting: Nurse Practitioner

## 2020-07-14 DIAGNOSIS — I10 Essential (primary) hypertension: Secondary | ICD-10-CM | POA: Diagnosis not present

## 2020-07-14 MED ORDER — NIFEDIPINE ER OSMOTIC RELEASE 60 MG PO TB24: 60.0000 mg | ORAL_TABLET | Freq: Every day | ORAL | 1 refills | Status: DC

## 2020-07-14 NOTE — Progress Notes (Signed)
Acute Office Visit  Subjective:    Patient ID: Janet Mitchell, female    DOB: 01-03-1966, 55 y.o.   MRN: [id number]  Chief Complaint  Patient presents with  . Follow-up    B/P     HPI Patient is in today for BP check.  At her last OV, her BP was 185/99, and she was started on clonidine.  She was also taking max dose amlodipine in addition to labetalol and losartan.  She stopped taking amlodipine due to leg swelling.  She stopped that about 3 weeks ago due to leg swelling.  She states she was having foot and ankle pain.  She is on hemodialysis currently, but her BP is not coming down after dialysis.   Past Medical History:  Diagnosis Date  . Abdominal wall hematoma 12/26/2019  . Anemia   . Arthritis   . CHF (congestive heart failure) ([place])    pt states she has been cleared of heart failure/disease  . Chronic cholecystitis with calculus   . Chronic kidney disease    Phreesia 12/12/2019  . COVID-19 03/26/2019  . COVID-19 virus infection 03/2019  . ESRD (end stage renal disease) on dialysis [facility])    Dialysis M-W-F  . Headache    "a few/wk" (03/09/2018) - no longer having these  . History of blood transfusion 10/2017   "low blood count" (03/09/2018)  . Hypertension   . Seasonal allergies   . Spinal headache   . Type II diabetes mellitus ([place])     Past Surgical History:  Procedure Laterality Date  . AMPUTATION TOE Left 2013   Great toe  . AV FISTULA PLACEMENT Left 07/22/2019   Procedure: LEFT ARM BASILIC ARTERIOVENOUS (AV) FISTULA CREATION;  Surgeon: Rosetta Posner, MD;  Location: [place];  Service: Vascular;  Laterality: Left;  . BASCILIC VEIN TRANSPOSITION Left 10/17/2019   Procedure: LEFT SECOND STAGE BASCILIC VEIN TRANSPOSITION;  Surgeon: Rosetta Posner, MD;  Location: [place];  Service: Vascular;  Laterality: Left;  . BIOPSY  10/24/2018   Procedure: BIOPSY;  Surgeon: Daneil Dolin, MD;  [facility];  Service: Endoscopy;;  right and left colon  . CATARACT  EXTRACTION W/ INTRAOCULAR LENS IMPLANT Right   . CESAREAN SECTION  1994; 1998  . CHOLECYSTECTOMY N/A 03/09/2018   Procedure: LAPAROSCOPIC CHOLECYSTECTOMY WITH INTRAOPERATIVE CHOLANGIOGRAM ERAS PATHWAY;  Surgeon: Donnie Mesa, MD;  Location: [place];  Service: General;  Laterality: N/A;  . COLONOSCOPY N/A 10/24/2018   Procedure: COLONOSCOPY;  Surgeon: Daneil Dolin, MD;  [facility];  Service: Endoscopy;  Laterality: N/A;  1:00pm  . EYE SURGERY Left 05/15/2018   Removal of blood in the globe (due to DM)  . FLEXIBLE SIGMOIDOSCOPY N/A 11/24/2017   Procedure: FLEXIBLE SIGMOIDOSCOPY;  Surgeon: Daneil Dolin, MD;  [facility];  Service: Endoscopy;  Laterality: N/A;  . IR FLUORO GUIDE CV LINE RIGHT  07/17/2019  . IR PARACENTESIS  07/09/2019  . IR US GUIDE VASC ACCESS RIGHT  07/17/2019  . LAPAROSCOPIC CHOLECYSTECTOMY  03/09/2018  . PERITONEAL CATHETER INSERTION  2017  . POLYPECTOMY  10/24/2018   Procedure: POLYPECTOMY;  Surgeon: Daneil Dolin, MD;  [facility];  Service: Endoscopy;;  . TOTAL HIP ARTHROPLASTY Left 1997    Family History  Problem Relation Age of Onset  . Heart disease Mother   . Thrombocytopenia Mother        TTP  . Heart failure Father   . Kidney  disease Paternal Grandfather   . Colon cancer Neg Hx     Social History   Socioeconomic History  . Marital status: Married    Spouse name: Not on file  . Number of children: 2  . Years of education: Not on file  . Highest education level: Not on file  Occupational History  . Not on file  Tobacco Use  . Smoking status: Passive Smoke Exposure - Never Smoker  . Smokeless tobacco: Never Used  Vaping Use  . Vaping Use: Never used  Substance and Sexual Activity  . Alcohol use: Never  . Drug use: Never  . Sexual activity: Yes    Birth control/protection: I.U.D.    Comment: Mirena IUD  Other Topics Concern  . Not on file  Social History Narrative  . Not on file   Social Determinants  of Health   Financial Resource Strain: Low Risk   . Difficulty of Paying Living Expenses: Not hard at all  Food Insecurity: No Food Insecurity  . Worried About Charity fundraiser in the Last Year: Never true  . Ran Out of Food in the Last Year: Never true  Transportation Needs: No Transportation Needs  . Lack of Transportation (Medical): No  . Lack of Transportation (Non-Medical): No  Physical Activity: Inactive  . Days of Exercise per Week: 0 days  . Minutes of Exercise per Session: 0 min  Stress: No Stress Concern Present  . Feeling of Stress : Not at all  Social Connections: Socially Integrated  . Frequency of Communication with Friends and Family: More than three times a week  . Frequency of Social Gatherings with Friends and Family: More than three times a week  . Attends Religious Services: More than 4 times per year  . Active Member of Clubs or Organizations: Yes  . Attends Archivist Meetings: More than 4 times per year  . Marital Status: Married  Human resources officer Violence: Not At Risk  . Fear of Current or Ex-Partner: No  . Emotionally Abused: No  . Physically Abused: No  . Sexually Abused: No    Outpatient Medications Prior to Visit  Medication Sig Dispense Refill  . acetaminophen (TYLENOL) 325 MG tablet Take 650 mg by mouth every 6 (six) hours as needed (pain).    Marland Kitchen aspirin EC 81 MG tablet Take 1 tablet (81 mg total) by mouth daily.    . B Complex-C-Folic Acid (RENA-VITE PO) Take 1 tablet by mouth daily.    . Blood Glucose Monitoring Suppl (ACCU-CHEK GUIDE) w/Device KIT 1 Piece by Does not apply route as directed. 1 kit 0  . calcitRIOL (ROCALTROL) 0.5 MCG capsule Take 1-2 capsules (0.5-1 mcg total) by mouth See admin instructions. 0.11mg on Mondays through Fridays and take 183m on Saturdays and Sundays 90 capsule 1  . calcium acetate (PHOSLO) 667 MG capsule Take by mouth.    . cetirizine (ZYRTEC) 10 MG tablet Take 1 tablet (10 mg total) by mouth daily. 30  tablet 1  . cloNIDine (CATAPRES) 0.2 MG tablet TAKE (1) TABLET BY MOUTH TWICE DAILY. 60 tablet 1  . glucose blood (ACCU-CHEK GUIDE) test strip Use as instructed 150 each 2  . insulin glargine, 2 Unit Dial, (TOUJEO MAX SOLOSTAR) 300 UNIT/ML Solostar Pen Inject 10 Units into the skin daily.    . Marland Kitchenabetalol (NORMODYNE) 300 MG tablet Take 1 tablet (300 mg total) by mouth 2 (two) times daily. 60 tablet 2  . losartan (COZAAR) 100 MG tablet Take 1  tablet (100 mg total) by mouth daily. 90 tablet 3  . amLODipine (NORVASC) 10 MG tablet Take 1 tablet (10 mg total) by mouth daily. (Patient not taking: Reported on 07/14/2020) 90 tablet 3   No facility-administered medications prior to visit.    No Known Allergies  Review of Systems  Constitutional: Negative.   Respiratory: Negative.   Cardiovascular: Negative.        BP elevated at home  Neurological: Positive for headaches.  Psychiatric/Behavioral: Negative.        Objective:    Physical Exam Constitutional:      Appearance: Normal appearance. She is obese.  Cardiovascular:     Rate and Rhythm: Normal rate and regular rhythm.     Pulses: Normal pulses.     Heart sounds: Normal heart sounds.  Pulmonary:     Effort: Pulmonary effort is normal.     Breath sounds: Normal breath sounds.  Neurological:     Mental Status: She is alert.  Psychiatric:        Mood and Affect: Mood normal.        Behavior: Behavior normal.        Thought Content: Thought content normal.        Judgment: Judgment normal.     BP (!) 173/83 (BP Location: Right Arm, Patient Position: Sitting, Cuff Size: Large)   Pulse 66   Temp 97.8 F (36.6 C) (Oral)   Ht '5\' 8"'  (1.727 m)   Wt 215 lb (97.5 kg)   SpO2 97%   BMI 32.69 kg/m  Wt Readings from Last 3 Encounters:  07/14/20 215 lb (97.5 kg)  06/25/20 220 lb (99.8 kg)  06/10/20 219 lb (99.3 kg)    Health Maintenance Due  Topic Date Due  . MAMMOGRAM  Never done    There are no preventive care reminders to  display for this patient.   No results found for: TSH Lab Results  Component Value Date   WBC 4.8 10/17/2019   HGB 11.2 (L) 10/17/2019   HCT 33.0 (L) 10/17/2019   MCV 77.0 (L) 10/17/2019   PLT 167 10/17/2019   Lab Results  Component Value Date   NA 138 10/17/2019   K 4.7 10/17/2019   CO2 30 08/29/2019   GLUCOSE 221 (H) 10/17/2019   BUN 42 (H) 10/17/2019   CREATININE 4.70 (H) 10/17/2019   BILITOT 0.5 08/29/2019   ALKPHOS 63 07/30/2019   AST 16 08/29/2019   ALT 15 08/29/2019   PROT 8.1 08/29/2019   ALBUMIN 2.4 (L) 07/30/2019   CALCIUM 9.2 08/29/2019   ANIONGAP 11 07/30/2019   Lab Results  Component Value Date   CHOL 114 08/29/2019   Lab Results  Component Value Date   HDL 35 (L) 08/29/2019   Lab Results  Component Value Date   LDLCALC 62 08/29/2019   Lab Results  Component Value Date   TRIG 91 08/29/2019   Lab Results  Component Value Date   CHOLHDL 3.3 08/29/2019   Lab Results  Component Value Date   HGBA1C 6.4 06/09/2020       Assessment & Plan:   Problem List Items Addressed This Visit      Cardiovascular and Mediastinum   Malignant hypertension    BP Readings from Last 3 Encounters:  07/14/20 (!) 173/83  06/25/20 (!) 185/99  06/09/20 (!) 153/86  -she stopped taking amlodipine d/t leg swelling -Rx. Nifedipine -continue clonidine, labetalol, and losartan       Relevant Medications  NIFEdipine (PROCARDIA XL/NIFEDICAL XL) 60 MG 24 hr tablet       Meds ordered this encounter  Medications  . NIFEdipine (PROCARDIA XL/NIFEDICAL XL) 60 MG 24 hr tablet    Sig: Take 1 tablet (60 mg total) by mouth daily.    Dispense:  90 tablet    Refill:  [place], NP

## 2020-07-14 NOTE — Assessment & Plan Note (Signed)
BP Readings from Last 3 Encounters:  07/14/20 (!) 173/83  06/25/20 (!) 185/99  06/09/20 (!) 153/86  -she stopped taking amlodipine d/t leg swelling -Rx. Nifedipine -continue clonidine, labetalol, and losartan

## 2020-07-15 DIAGNOSIS — D509 Iron deficiency anemia, unspecified: Secondary | ICD-10-CM | POA: Diagnosis not present

## 2020-07-15 DIAGNOSIS — N2581 Secondary hyperparathyroidism of renal origin: Secondary | ICD-10-CM | POA: Diagnosis not present

## 2020-07-15 DIAGNOSIS — N186 End stage renal disease: Secondary | ICD-10-CM | POA: Diagnosis not present

## 2020-07-15 DIAGNOSIS — Z992 Dependence on renal dialysis: Secondary | ICD-10-CM | POA: Diagnosis not present

## 2020-07-15 DIAGNOSIS — D631 Anemia in chronic kidney disease: Secondary | ICD-10-CM | POA: Diagnosis not present

## 2020-07-16 ENCOUNTER — Other Ambulatory Visit: Payer: Self-pay | Admitting: Internal Medicine

## 2020-07-17 DIAGNOSIS — D509 Iron deficiency anemia, unspecified: Secondary | ICD-10-CM | POA: Diagnosis not present

## 2020-07-17 DIAGNOSIS — N186 End stage renal disease: Secondary | ICD-10-CM | POA: Diagnosis not present

## 2020-07-17 DIAGNOSIS — Z992 Dependence on renal dialysis: Secondary | ICD-10-CM | POA: Diagnosis not present

## 2020-07-17 DIAGNOSIS — D631 Anemia in chronic kidney disease: Secondary | ICD-10-CM | POA: Diagnosis not present

## 2020-07-17 DIAGNOSIS — N2581 Secondary hyperparathyroidism of renal origin: Secondary | ICD-10-CM | POA: Diagnosis not present

## 2020-07-20 DIAGNOSIS — N2581 Secondary hyperparathyroidism of renal origin: Secondary | ICD-10-CM | POA: Diagnosis not present

## 2020-07-20 DIAGNOSIS — Z992 Dependence on renal dialysis: Secondary | ICD-10-CM | POA: Diagnosis not present

## 2020-07-20 DIAGNOSIS — D509 Iron deficiency anemia, unspecified: Secondary | ICD-10-CM | POA: Diagnosis not present

## 2020-07-20 DIAGNOSIS — D631 Anemia in chronic kidney disease: Secondary | ICD-10-CM | POA: Diagnosis not present

## 2020-07-20 DIAGNOSIS — N186 End stage renal disease: Secondary | ICD-10-CM | POA: Diagnosis not present

## 2020-07-21 ENCOUNTER — Other Ambulatory Visit: Payer: Self-pay

## 2020-07-21 ENCOUNTER — Ambulatory Visit (INDEPENDENT_AMBULATORY_CARE_PROVIDER_SITE_OTHER): Payer: Medicare Other | Admitting: Podiatry

## 2020-07-21 DIAGNOSIS — E1149 Type 2 diabetes mellitus with other diabetic neurological complication: Secondary | ICD-10-CM | POA: Diagnosis not present

## 2020-07-21 DIAGNOSIS — E1140 Type 2 diabetes mellitus with diabetic neuropathy, unspecified: Secondary | ICD-10-CM | POA: Diagnosis not present

## 2020-07-21 DIAGNOSIS — L84 Corns and callosities: Secondary | ICD-10-CM | POA: Diagnosis not present

## 2020-07-21 DIAGNOSIS — B351 Tinea unguium: Secondary | ICD-10-CM | POA: Diagnosis not present

## 2020-07-21 DIAGNOSIS — L03116 Cellulitis of left lower limb: Secondary | ICD-10-CM

## 2020-07-21 NOTE — Progress Notes (Signed)
  Subjective:  Patient ID: Janet Mitchell, female    DOB: 1965/10/08,  MRN: [id number]  Chief Complaint  Patient presents with  . Nail Problem    Patient reports skin is discolored after having cellulitis in the area.     55 y.o. female presents with the above complaint. History confirmed with patient.  Is concerned about the color of her left leg after having cellulitis.  She took antibiotics as directed.  Otherwise request care of her nails.  Objective:  Physical Exam: warm, good capillary refill, nail exam onychomycosis of the toenails, no trophic changes or ulcerative lesions. protective sensation intact , faintly palpable pedal pulses. Hx of left great toe amputation. Mild plantar 1st HPK without open ulceration upon debridement. Healed cellulitis left with residual skin staining.  No images are attached to the encounter.  Assessment:   1. Onychomycosis   2. Callus   3. Diabetic neuropathy with neurologic complication (HCC)   4. Cellulitis of left leg      Plan:  Patient was evaluated and treated and all questions answered.  Onychomycosis, Diabetes and DPN -Patient is diabetic with a qualifying condition for at risk foot care.  Procedure: Nail Debridement Type of Debridement: manual, sharp debridement. Instrumentation: Nail nipper, rotary burr. Number of Nails: 9  Resolved Cellulitis left -Advised that commonly the skin will stay dark 2/2 bacterial toxin formation. The skin is cool and does not show signs of continued cellulitis.   No follow-ups on file.

## 2020-07-22 DIAGNOSIS — N186 End stage renal disease: Secondary | ICD-10-CM | POA: Diagnosis not present

## 2020-07-22 DIAGNOSIS — D509 Iron deficiency anemia, unspecified: Secondary | ICD-10-CM | POA: Diagnosis not present

## 2020-07-22 DIAGNOSIS — Z992 Dependence on renal dialysis: Secondary | ICD-10-CM | POA: Diagnosis not present

## 2020-07-22 DIAGNOSIS — N2581 Secondary hyperparathyroidism of renal origin: Secondary | ICD-10-CM | POA: Diagnosis not present

## 2020-07-22 DIAGNOSIS — D631 Anemia in chronic kidney disease: Secondary | ICD-10-CM | POA: Diagnosis not present

## 2020-07-24 DIAGNOSIS — N2581 Secondary hyperparathyroidism of renal origin: Secondary | ICD-10-CM | POA: Diagnosis not present

## 2020-07-24 DIAGNOSIS — N186 End stage renal disease: Secondary | ICD-10-CM | POA: Diagnosis not present

## 2020-07-24 DIAGNOSIS — D631 Anemia in chronic kidney disease: Secondary | ICD-10-CM | POA: Diagnosis not present

## 2020-07-24 DIAGNOSIS — D509 Iron deficiency anemia, unspecified: Secondary | ICD-10-CM | POA: Diagnosis not present

## 2020-07-24 DIAGNOSIS — Z992 Dependence on renal dialysis: Secondary | ICD-10-CM | POA: Diagnosis not present

## 2020-07-25 DIAGNOSIS — N186 End stage renal disease: Secondary | ICD-10-CM | POA: Diagnosis not present

## 2020-07-25 DIAGNOSIS — Z992 Dependence on renal dialysis: Secondary | ICD-10-CM | POA: Diagnosis not present

## 2020-07-27 DIAGNOSIS — N186 End stage renal disease: Secondary | ICD-10-CM | POA: Diagnosis not present

## 2020-07-27 DIAGNOSIS — D509 Iron deficiency anemia, unspecified: Secondary | ICD-10-CM | POA: Diagnosis not present

## 2020-07-27 DIAGNOSIS — Z992 Dependence on renal dialysis: Secondary | ICD-10-CM | POA: Diagnosis not present

## 2020-07-27 DIAGNOSIS — N2581 Secondary hyperparathyroidism of renal origin: Secondary | ICD-10-CM | POA: Diagnosis not present

## 2020-07-27 DIAGNOSIS — D631 Anemia in chronic kidney disease: Secondary | ICD-10-CM | POA: Diagnosis not present

## 2020-07-28 ENCOUNTER — Ambulatory Visit: Payer: Medicare Other | Admitting: Internal Medicine

## 2020-07-29 DIAGNOSIS — D509 Iron deficiency anemia, unspecified: Secondary | ICD-10-CM | POA: Diagnosis not present

## 2020-07-29 DIAGNOSIS — N2581 Secondary hyperparathyroidism of renal origin: Secondary | ICD-10-CM | POA: Diagnosis not present

## 2020-07-29 DIAGNOSIS — Z992 Dependence on renal dialysis: Secondary | ICD-10-CM | POA: Diagnosis not present

## 2020-07-29 DIAGNOSIS — D631 Anemia in chronic kidney disease: Secondary | ICD-10-CM | POA: Diagnosis not present

## 2020-07-29 DIAGNOSIS — N186 End stage renal disease: Secondary | ICD-10-CM | POA: Diagnosis not present

## 2020-08-03 DIAGNOSIS — N186 End stage renal disease: Secondary | ICD-10-CM | POA: Diagnosis not present

## 2020-08-03 DIAGNOSIS — N2581 Secondary hyperparathyroidism of renal origin: Secondary | ICD-10-CM | POA: Diagnosis not present

## 2020-08-03 DIAGNOSIS — Z992 Dependence on renal dialysis: Secondary | ICD-10-CM | POA: Diagnosis not present

## 2020-08-03 DIAGNOSIS — D509 Iron deficiency anemia, unspecified: Secondary | ICD-10-CM | POA: Diagnosis not present

## 2020-08-03 DIAGNOSIS — D631 Anemia in chronic kidney disease: Secondary | ICD-10-CM | POA: Diagnosis not present

## 2020-08-04 ENCOUNTER — Ambulatory Visit (INDEPENDENT_AMBULATORY_CARE_PROVIDER_SITE_OTHER): Payer: Medicare Other | Admitting: Internal Medicine

## 2020-08-04 ENCOUNTER — Encounter: Payer: Self-pay | Admitting: Internal Medicine

## 2020-08-04 ENCOUNTER — Other Ambulatory Visit: Payer: Self-pay | Admitting: *Deleted

## 2020-08-04 ENCOUNTER — Other Ambulatory Visit: Payer: Self-pay

## 2020-08-04 VITALS — BP 124/82 | HR 67 | Resp 18 | Ht 68.0 in | Wt 209.8 lb

## 2020-08-04 DIAGNOSIS — I10 Essential (primary) hypertension: Secondary | ICD-10-CM

## 2020-08-04 DIAGNOSIS — Z1231 Encounter for screening mammogram for malignant neoplasm of breast: Secondary | ICD-10-CM | POA: Diagnosis not present

## 2020-08-04 MED ORDER — NIFEDIPINE ER OSMOTIC RELEASE 30 MG PO TB24: ORAL_TABLET | ORAL | 0 refills | Status: DC

## 2020-08-04 MED ORDER — ACCU-CHEK SOFTCLIX LANCETS MISC: 12 refills | Status: DC

## 2020-08-04 MED ORDER — ACCU-CHEK GUIDE VI STRP: ORAL_STRIP | 2 refills | Status: DC

## 2020-08-04 NOTE — Assessment & Plan Note (Signed)
BP Readings from Last 1 Encounters:  08/04/20 124/82   Patient has h/o significantly elevated BP, now improved, but having dizziness - likely related to adaptation concern Continue Losartan 100 mg QD and Labetalol 300 mg BID Decreased Nifedipine to 30 mg QD on non-dialysis days only as patient had hypotension on dialysis days Counseled for compliance with the medications Advised DASH diet and moderate exercise/walking, at least 150 mins/week

## 2020-08-04 NOTE — Patient Instructions (Signed)
Please continue taking Losartan and Labetalol. Decrease dose of Nifedipine to 30 mg on non-dialysis days.  Please continue to follow low salt and low potassium diet.

## 2020-08-04 NOTE — Progress Notes (Signed)
Established Patient Office Visit  Subjective:  Patient ID: Janet Mitchell, female    DOB: 08-27-1965  Age: 55 y.o. MRN: [id number]  CC:  Chief Complaint  Patient presents with  . Follow-up    Follow up pt started nifedipine but since taking when she takes her meds she gets dizzy and jumpy also was told not to take this on dialysis as it drops her bp too low she was moving around good without help now she is back to depending on people     HPI Janet Mitchell is a 55 y.o.femalewith PMH of HTN and DM with ESRD on HDpresents for follow-up for her hypertension.  She has been having dizziness and fatigue since starting Nifedipine. Her BP was wnl today, but she has h/o ling-standing uncontrolled HTN. She reports hypotensive episodes on dialysis days. She was told to avoid Nifedipine on dialysis days. She has stopped taking Clonidine as well. She denies any headache, chest pain, dyspnea or palpitations.  Past Medical History:  Diagnosis Date  . Abdominal wall hematoma 12/26/2019  . Anemia   . Arthritis   . CHF (congestive heart failure) ([place])    pt states she has been cleared of heart failure/disease  . Chronic cholecystitis with calculus   . Chronic kidney disease    Phreesia 12/12/2019  . COVID-19 03/26/2019  . COVID-19 virus infection 03/2019  . ESRD (end stage renal disease) on dialysis [facility])    Dialysis M-W-F  . Headache    "a few/wk" (03/09/2018) - no longer having these  . History of blood transfusion 10/2017   "low blood count" (03/09/2018)  . Hypertension   . Seasonal allergies   . Spinal headache   . Type II diabetes mellitus ([place])     Past Surgical History:  Procedure Laterality Date  . AMPUTATION TOE Left 2013   Great toe  . AV FISTULA PLACEMENT Left 07/22/2019   Procedure: LEFT ARM BASILIC ARTERIOVENOUS (AV) FISTULA CREATION;  Surgeon: Rosetta Posner, MD;  Location: [place];  Service: Vascular;  Laterality: Left;  . BASCILIC VEIN TRANSPOSITION Left  10/17/2019   Procedure: LEFT SECOND STAGE BASCILIC VEIN TRANSPOSITION;  Surgeon: Rosetta Posner, MD;  Location: [place];  Service: Vascular;  Laterality: Left;  . BIOPSY  10/24/2018   Procedure: BIOPSY;  Surgeon: Daneil Dolin, MD;  [facility];  Service: Endoscopy;;  right and left colon  . CATARACT EXTRACTION W/ INTRAOCULAR LENS IMPLANT Right   . CESAREAN SECTION  1994; 1998  . CHOLECYSTECTOMY N/A 03/09/2018   Procedure: LAPAROSCOPIC CHOLECYSTECTOMY WITH INTRAOPERATIVE CHOLANGIOGRAM ERAS PATHWAY;  Surgeon: Donnie Mesa, MD;  Location: [place];  Service: General;  Laterality: N/A;  . COLONOSCOPY N/A 10/24/2018   Procedure: COLONOSCOPY;  Surgeon: Daneil Dolin, MD;  [facility];  Service: Endoscopy;  Laterality: N/A;  1:00pm  . EYE SURGERY Left 05/15/2018   Removal of blood in the globe (due to DM)  . FLEXIBLE SIGMOIDOSCOPY N/A 11/24/2017   Procedure: FLEXIBLE SIGMOIDOSCOPY;  Surgeon: Daneil Dolin, MD;  [facility];  Service: Endoscopy;  Laterality: N/A;  . IR FLUORO GUIDE CV LINE RIGHT  07/17/2019  . IR PARACENTESIS  07/09/2019  . IR US GUIDE VASC ACCESS RIGHT  07/17/2019  . LAPAROSCOPIC CHOLECYSTECTOMY  03/09/2018  . PERITONEAL CATHETER INSERTION  2017  . POLYPECTOMY  10/24/2018   Procedure: POLYPECTOMY;  Surgeon: Daneil Dolin, MD;  [facility];  Service: Endoscopy;;  . TOTAL  HIP ARTHROPLASTY Left 1997    Family History  Problem Relation Age of Onset  . Heart disease Mother   . Thrombocytopenia Mother        TTP  . Heart failure Father   . Kidney disease Paternal Grandfather   . Colon cancer Neg Hx     Social History   Socioeconomic History  . Marital status: Married    Spouse name: Not on file  . Number of children: 2  . Years of education: Not on file  . Highest education level: Not on file  Occupational History  . Not on file  Tobacco Use  . Smoking status: Passive Smoke Exposure - Never Smoker  . Smokeless tobacco:  Never Used  Vaping Use  . Vaping Use: Never used  Substance and Sexual Activity  . Alcohol use: Never  . Drug use: Never  . Sexual activity: Yes    Birth control/protection: I.U.D.    Comment: Mirena IUD  Other Topics Concern  . Not on file  Social History Narrative  . Not on file   Social Determinants of Health   Financial Resource Strain: Low Risk   . Difficulty of Paying Living Expenses: Not hard at all  Food Insecurity: No Food Insecurity  . Worried About Charity fundraiser in the Last Year: Never true  . Ran Out of Food in the Last Year: Never true  Transportation Needs: No Transportation Needs  . Lack of Transportation (Medical): No  . Lack of Transportation (Non-Medical): No  Physical Activity: Inactive  . Days of Exercise per Week: 0 days  . Minutes of Exercise per Session: 0 min  Stress: No Stress Concern Present  . Feeling of Stress : Not at all  Social Connections: Socially Integrated  . Frequency of Communication with Friends and Family: More than three times a week  . Frequency of Social Gatherings with Friends and Family: More than three times a week  . Attends Religious Services: More than 4 times per year  . Active Member of Clubs or Organizations: Yes  . Attends Archivist Meetings: More than 4 times per year  . Marital Status: Married  Human resources officer Violence: Not At Risk  . Fear of Current or Ex-Partner: No  . Emotionally Abused: No  . Physically Abused: No  . Sexually Abused: No    Outpatient Medications Prior to Visit  Medication Sig Dispense Refill  . acetaminophen (TYLENOL) 325 MG tablet Take 650 mg by mouth every 6 (six) hours as needed (pain).    Marland Kitchen aspirin EC 81 MG tablet Take 1 tablet (81 mg total) by mouth daily.    . B Complex-C-Folic Acid (RENA-VITE PO) Take 1 tablet by mouth daily.    . Blood Glucose Monitoring Suppl (ACCU-CHEK GUIDE) w/Device KIT 1 Piece by Does not apply route as directed. 1 kit 0  . calcitRIOL (ROCALTROL)  0.5 MCG capsule Take 1-2 capsules (0.5-1 mcg total) by mouth See admin instructions. 0.37mg on Mondays through Fridays and take 185m on Saturdays and Sundays 90 capsule 1  . calcium acetate (PHOSLO) 667 MG capsule Take by mouth.    . cetirizine (ZYRTEC) 10 MG tablet TAKE 1 TABLET BY MOUTH ONCE DAILY. 30 tablet 0  . cloNIDine (CATAPRES) 0.2 MG tablet TAKE (1) TABLET BY MOUTH TWICE DAILY. 60 tablet 1  . insulin glargine, 2 Unit Dial, (TOUJEO MAX SOLOSTAR) 300 UNIT/ML Solostar Pen Inject 10 Units into the skin daily.    . Marland Kitchenabetalol (NORMODYNE) 300  MG tablet Take 1 tablet (300 mg total) by mouth 2 (two) times daily. 60 tablet 2  . losartan (COZAAR) 100 MG tablet Take 1 tablet (100 mg total) by mouth daily. 90 tablet 3  . glucose blood (ACCU-CHEK GUIDE) test strip Use as instructed 150 each 2  . NIFEdipine (PROCARDIA XL/NIFEDICAL XL) 60 MG 24 hr tablet Take 1 tablet (60 mg total) by mouth daily. (Patient taking differently: Take 30 mg by mouth daily.) 90 tablet 1   No facility-administered medications prior to visit.    No Known Allergies  ROS Review of Systems  Constitutional: Positive for fatigue. Negative for chills and fever.  HENT: Negative for congestion, sinus pressure, sinus pain and sore throat.   Eyes: Negative for pain and discharge.  Respiratory: Negative for cough and shortness of breath.   Cardiovascular: Negative for chest pain and palpitations.  Gastrointestinal: Negative for abdominal pain, constipation, diarrhea, nausea and vomiting.  Endocrine: Negative for polydipsia and polyuria.  Genitourinary: Negative for dysuria and hematuria.  Musculoskeletal: Negative for neck pain and neck stiffness.  Skin: Negative for rash.  Neurological: Positive for dizziness. Negative for weakness.  Psychiatric/Behavioral: Negative for agitation and behavioral problems.      Objective:    Physical Exam Vitals reviewed.  Constitutional:      General: She is not in acute distress.     Appearance: She is not diaphoretic.  HENT:     Head: Normocephalic and atraumatic.     Nose: Nose normal. No congestion.     Mouth/Throat:     Mouth: Mucous membranes are moist.     Pharynx: No posterior oropharyngeal erythema.  Eyes:     General: No scleral icterus.    Extraocular Movements: Extraocular movements intact.  Cardiovascular:     Rate and Rhythm: Normal rate and regular rhythm.     Pulses: Normal pulses.     Heart sounds: Normal heart sounds. No murmur heard.   Pulmonary:     Breath sounds: Normal breath sounds. No wheezing or rales.  Musculoskeletal:     Cervical back: Neck supple. No tenderness.     Right lower leg: No edema.     Left lower leg: No edema.  Skin:    General: Skin is warm.     Findings: No rash.  Neurological:     General: No focal deficit present.     Mental Status: She is alert and oriented to person, place, and time.  Psychiatric:        Mood and Affect: Mood normal.        Behavior: Behavior normal.     BP 124/82 (BP Location: Right Arm, Patient Position: Sitting, Cuff Size: Normal)   Pulse 67   Resp 18   Ht '5\' 8"'  (1.727 m)   Wt 209 lb 12.8 oz (95.2 kg)   SpO2 97%   BMI 31.90 kg/m  Wt Readings from Last 3 Encounters:  08/04/20 209 lb 12.8 oz (95.2 kg)  07/14/20 215 lb (97.5 kg)  06/25/20 220 lb (99.8 kg)     Health Maintenance Due  Topic Date Due  . MAMMOGRAM  Never done    There are no preventive care reminders to display for this patient.  No results found for: TSH Lab Results  Component Value Date   WBC 4.8 10/17/2019   HGB 11.2 (L) 10/17/2019   HCT 33.0 (L) 10/17/2019   MCV 77.0 (L) 10/17/2019   PLT 167 10/17/2019   Lab Results  Component Value  Date   NA 138 10/17/2019   K 4.7 10/17/2019   CO2 30 08/29/2019   GLUCOSE 221 (H) 10/17/2019   BUN 42 (H) 10/17/2019   CREATININE 4.70 (H) 10/17/2019   BILITOT 0.5 08/29/2019   ALKPHOS 63 07/30/2019   AST 16 08/29/2019   ALT 15 08/29/2019   PROT 8.1 08/29/2019    ALBUMIN 2.4 (L) 07/30/2019   CALCIUM 9.2 08/29/2019   ANIONGAP 11 07/30/2019   Lab Results  Component Value Date   CHOL 114 08/29/2019   Lab Results  Component Value Date   HDL 35 (L) 08/29/2019   Lab Results  Component Value Date   LDLCALC 62 08/29/2019   Lab Results  Component Value Date   TRIG 91 08/29/2019   Lab Results  Component Value Date   CHOLHDL 3.3 08/29/2019   Lab Results  Component Value Date   HGBA1C 6.4 06/09/2020      Assessment & Plan:   Problem List Items Addressed This Visit      Cardiovascular and Mediastinum   Malignant hypertension - Primary    BP Readings from Last 1 Encounters:  08/04/20 124/82   Patient has h/o significantly elevated BP, now improved, but having dizziness - likely related to adaptation concern Continue Losartan 100 mg QD and Labetalol 300 mg BID Decreased Nifedipine to 30 mg QD on non-dialysis days only as patient had hypotension on dialysis days Counseled for compliance with the medications Advised DASH diet and moderate exercise/walking, at least 150 mins/week       Relevant Medications   NIFEdipine (PROCARDIA-XL/NIFEDICAL-XL) 30 MG 24 hr tablet    Other Visit Diagnoses    Screening mammogram for breast cancer       Relevant Orders   MM 3D SCREEN BREAST BILATERAL      Meds ordered this encounter  Medications  . NIFEdipine (PROCARDIA-XL/NIFEDICAL-XL) 30 MG 24 hr tablet    Sig: Take 1 tablet by mouth on Tuesday, Thursday and Saturday.    Dispense:  30 tablet    Refill:  0    Follow-up: Return in about 2 months (around 10/04/2020).    Lindell Spar, MD

## 2020-08-05 DIAGNOSIS — N186 End stage renal disease: Secondary | ICD-10-CM | POA: Diagnosis not present

## 2020-08-05 DIAGNOSIS — Z992 Dependence on renal dialysis: Secondary | ICD-10-CM | POA: Diagnosis not present

## 2020-08-05 DIAGNOSIS — N2581 Secondary hyperparathyroidism of renal origin: Secondary | ICD-10-CM | POA: Diagnosis not present

## 2020-08-05 DIAGNOSIS — D631 Anemia in chronic kidney disease: Secondary | ICD-10-CM | POA: Diagnosis not present

## 2020-08-05 DIAGNOSIS — D509 Iron deficiency anemia, unspecified: Secondary | ICD-10-CM | POA: Diagnosis not present

## 2020-08-07 DIAGNOSIS — N186 End stage renal disease: Secondary | ICD-10-CM | POA: Diagnosis not present

## 2020-08-07 DIAGNOSIS — N2581 Secondary hyperparathyroidism of renal origin: Secondary | ICD-10-CM | POA: Diagnosis not present

## 2020-08-07 DIAGNOSIS — Z992 Dependence on renal dialysis: Secondary | ICD-10-CM | POA: Diagnosis not present

## 2020-08-07 DIAGNOSIS — D631 Anemia in chronic kidney disease: Secondary | ICD-10-CM | POA: Diagnosis not present

## 2020-08-07 DIAGNOSIS — D509 Iron deficiency anemia, unspecified: Secondary | ICD-10-CM | POA: Diagnosis not present

## 2020-08-10 DIAGNOSIS — N2581 Secondary hyperparathyroidism of renal origin: Secondary | ICD-10-CM | POA: Diagnosis not present

## 2020-08-10 DIAGNOSIS — D509 Iron deficiency anemia, unspecified: Secondary | ICD-10-CM | POA: Diagnosis not present

## 2020-08-10 DIAGNOSIS — D631 Anemia in chronic kidney disease: Secondary | ICD-10-CM | POA: Diagnosis not present

## 2020-08-10 DIAGNOSIS — Z992 Dependence on renal dialysis: Secondary | ICD-10-CM | POA: Diagnosis not present

## 2020-08-10 DIAGNOSIS — N186 End stage renal disease: Secondary | ICD-10-CM | POA: Diagnosis not present

## 2020-08-11 DIAGNOSIS — H3563 Retinal hemorrhage, bilateral: Secondary | ICD-10-CM | POA: Diagnosis not present

## 2020-08-11 LAB — HM DIABETES EYE EXAM

## 2020-08-12 DIAGNOSIS — D509 Iron deficiency anemia, unspecified: Secondary | ICD-10-CM | POA: Diagnosis not present

## 2020-08-12 DIAGNOSIS — N2581 Secondary hyperparathyroidism of renal origin: Secondary | ICD-10-CM | POA: Diagnosis not present

## 2020-08-12 DIAGNOSIS — D631 Anemia in chronic kidney disease: Secondary | ICD-10-CM | POA: Diagnosis not present

## 2020-08-12 DIAGNOSIS — Z992 Dependence on renal dialysis: Secondary | ICD-10-CM | POA: Diagnosis not present

## 2020-08-12 DIAGNOSIS — N186 End stage renal disease: Secondary | ICD-10-CM | POA: Diagnosis not present

## 2020-08-13 ENCOUNTER — Ambulatory Visit (HOSPITAL_COMMUNITY)
Admission: RE | Admit: 2020-08-13 | Discharge: 2020-08-13 | Disposition: A | Discharge status: Home / Self Care | Payer: Medicare Other | Source: Ambulatory Visit | Attending: Internal Medicine | Admitting: Internal Medicine

## 2020-08-13 ENCOUNTER — Other Ambulatory Visit: Payer: Self-pay

## 2020-08-13 DIAGNOSIS — Z1231 Encounter for screening mammogram for malignant neoplasm of breast: Secondary | ICD-10-CM | POA: Diagnosis not present

## 2020-08-13 IMAGING — MG MM DIGITAL SCREENING BILAT W/ TOMO AND CAD
6 of 12 series · 6 of 36 positions shown · non-contrast
Comparison: None.

CLINICAL DATA: Screening.

EXAM:
DIGITAL SCREENING BILATERAL MAMMOGRAM WITH TOMOSYNTHESIS AND CAD
TECHNIQUE: Bilateral screening digital craniocaudal and mediolateral oblique
mammograms were obtained. Bilateral screening digital breast
tomosynthesis was performed. The images were evaluated with
computer-aided detection.

[R CC synth-2D]
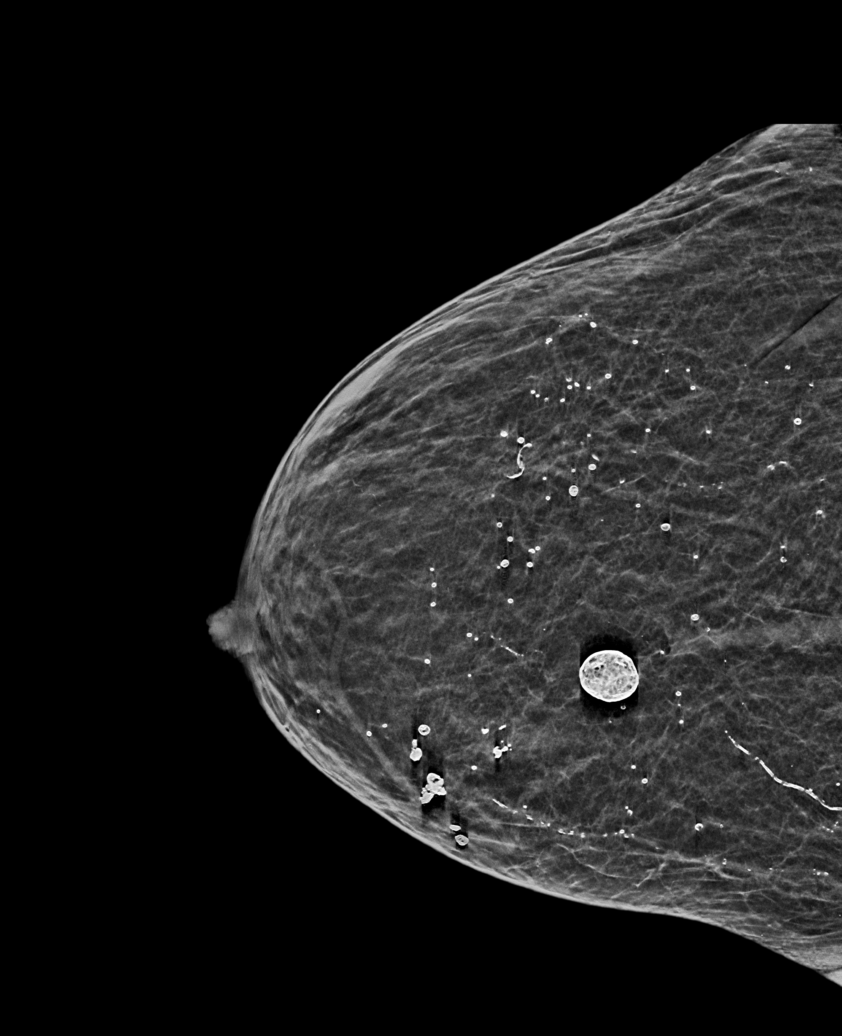

[L MLO synth-2D (1 of 2)]
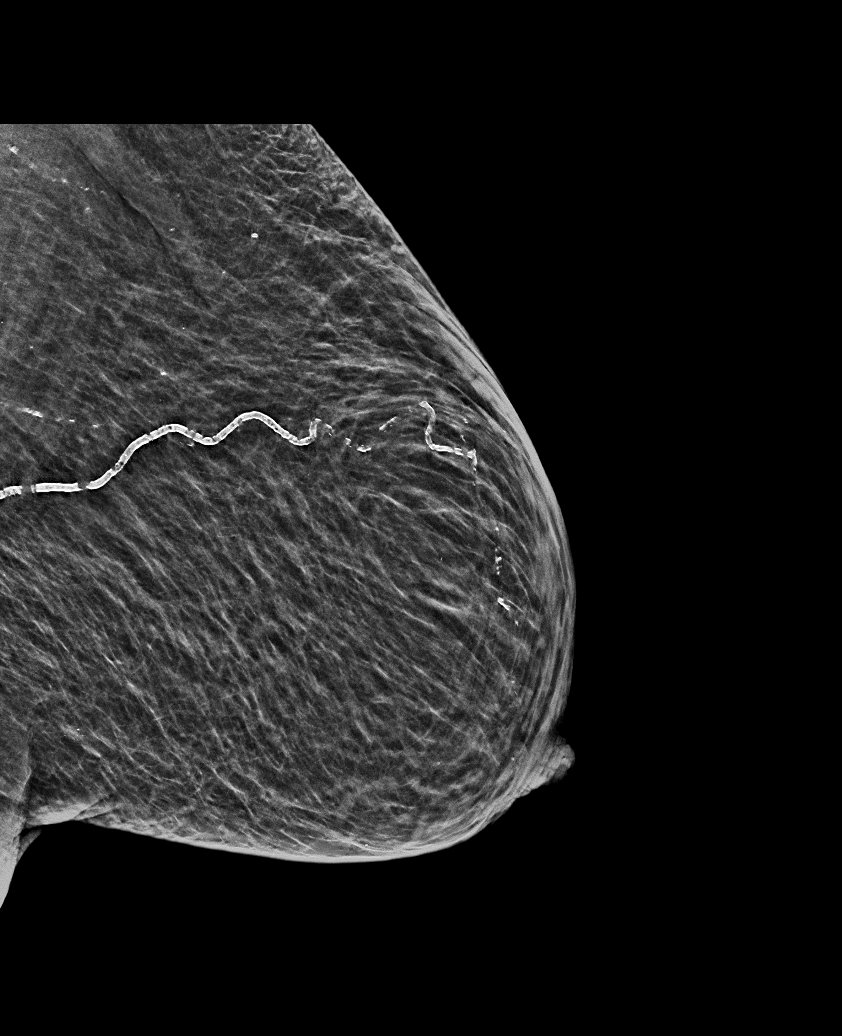

[L MLO synth-2D (2 of 2)]
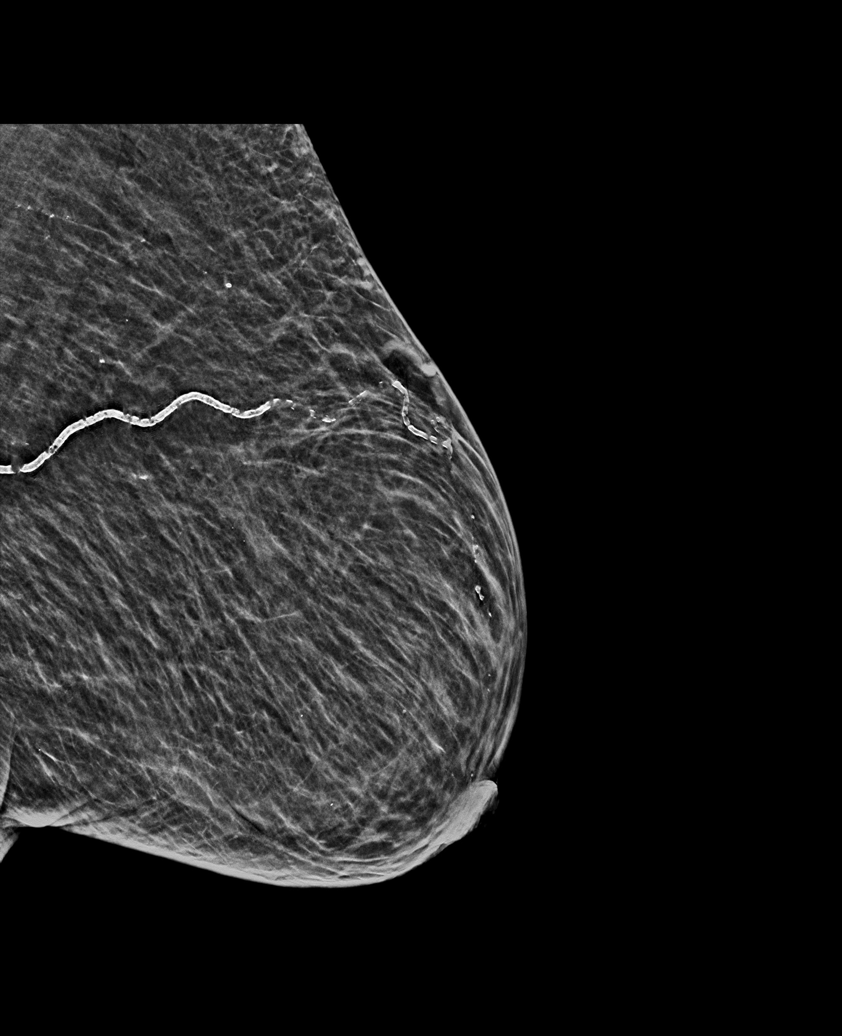

[L CC synth-2D]
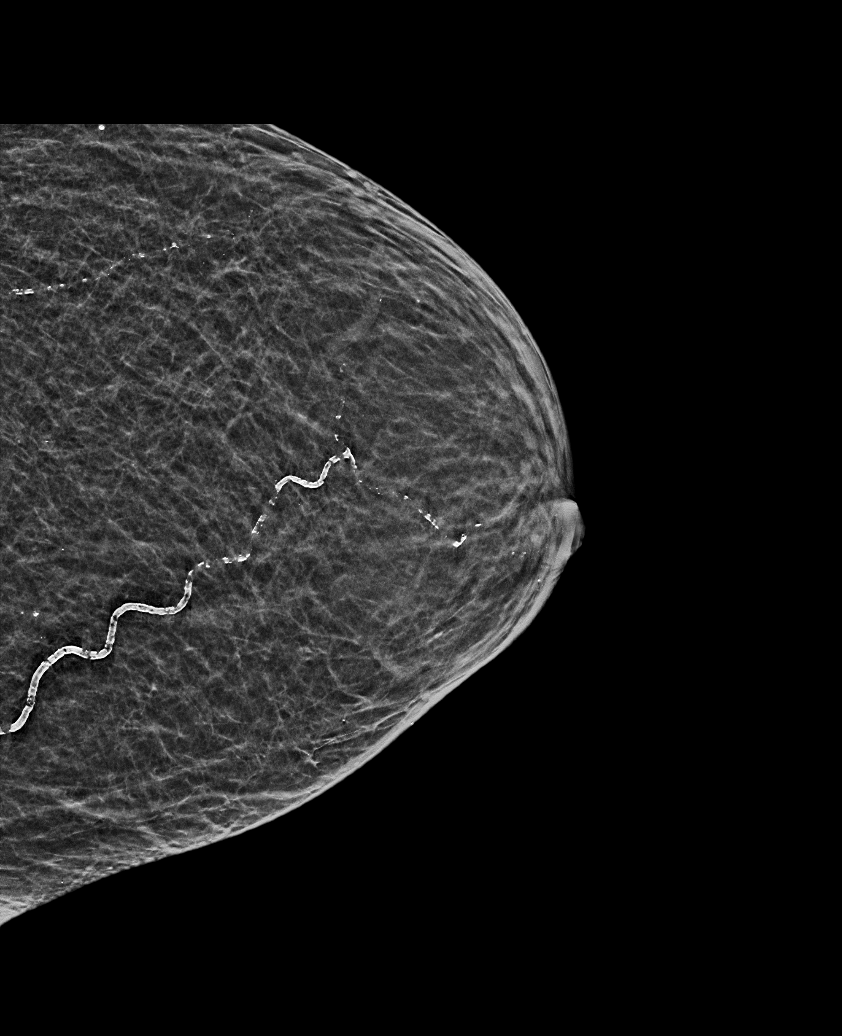

[R MLO synth-2D (1 of 2)]
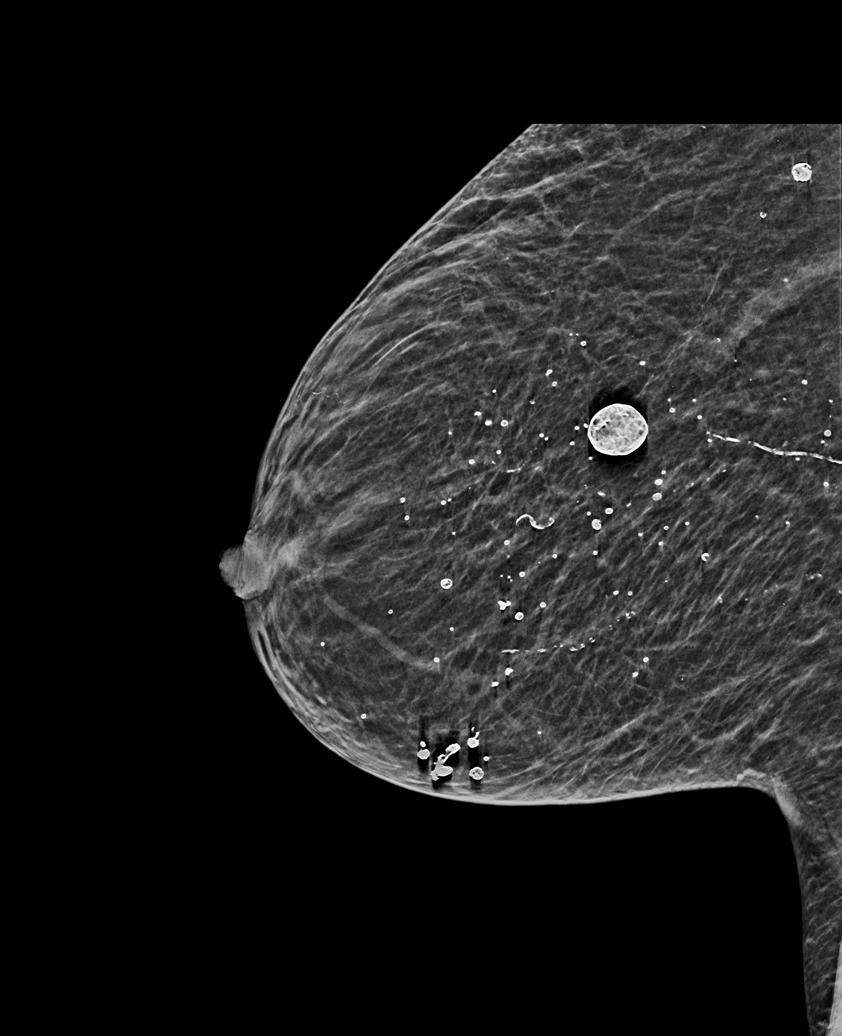

[R MLO synth-2D (2 of 2)]
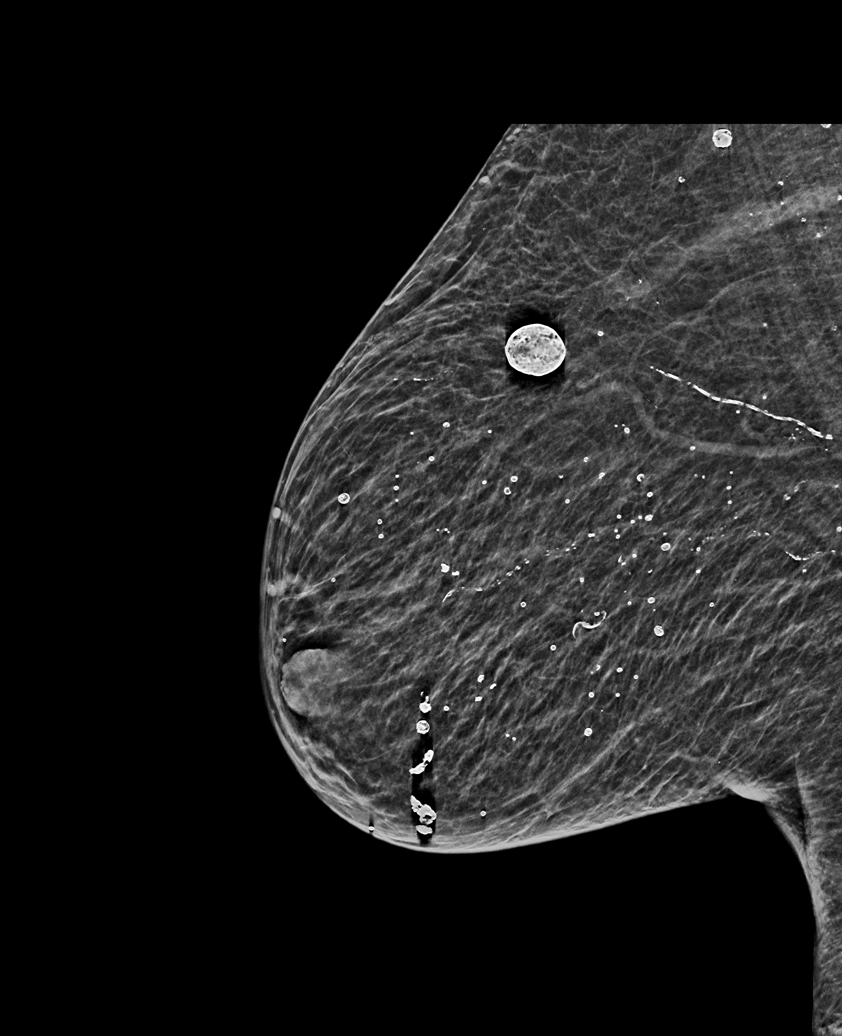

[6 of 36 positions shown; findings below may reference images not displayed]

ACR Breast Density Category b: There are scattered areas of
fibroglandular density.
FINDINGS: There are no findings suspicious for malignancy. The images were
evaluated with computer-aided detection.
IMPRESSION: No mammographic evidence of malignancy. A result letter of this
screening mammogram will be mailed directly to the patient.

RECOMMENDATION:
Screening mammogram in one year. (Code:[70])

BI-RADS CATEGORY  1: Negative.

## 2020-08-14 DIAGNOSIS — N186 End stage renal disease: Secondary | ICD-10-CM | POA: Diagnosis not present

## 2020-08-14 DIAGNOSIS — D631 Anemia in chronic kidney disease: Secondary | ICD-10-CM | POA: Diagnosis not present

## 2020-08-14 DIAGNOSIS — D509 Iron deficiency anemia, unspecified: Secondary | ICD-10-CM | POA: Diagnosis not present

## 2020-08-14 DIAGNOSIS — N2581 Secondary hyperparathyroidism of renal origin: Secondary | ICD-10-CM | POA: Diagnosis not present

## 2020-08-14 DIAGNOSIS — Z992 Dependence on renal dialysis: Secondary | ICD-10-CM | POA: Diagnosis not present

## 2020-08-17 DIAGNOSIS — Z992 Dependence on renal dialysis: Secondary | ICD-10-CM | POA: Diagnosis not present

## 2020-08-17 DIAGNOSIS — N2581 Secondary hyperparathyroidism of renal origin: Secondary | ICD-10-CM | POA: Diagnosis not present

## 2020-08-17 DIAGNOSIS — D509 Iron deficiency anemia, unspecified: Secondary | ICD-10-CM | POA: Diagnosis not present

## 2020-08-17 DIAGNOSIS — N186 End stage renal disease: Secondary | ICD-10-CM | POA: Diagnosis not present

## 2020-08-17 DIAGNOSIS — D631 Anemia in chronic kidney disease: Secondary | ICD-10-CM | POA: Diagnosis not present

## 2020-08-19 DIAGNOSIS — D631 Anemia in chronic kidney disease: Secondary | ICD-10-CM | POA: Diagnosis not present

## 2020-08-19 DIAGNOSIS — N2581 Secondary hyperparathyroidism of renal origin: Secondary | ICD-10-CM | POA: Diagnosis not present

## 2020-08-19 DIAGNOSIS — D509 Iron deficiency anemia, unspecified: Secondary | ICD-10-CM | POA: Diagnosis not present

## 2020-08-19 DIAGNOSIS — N186 End stage renal disease: Secondary | ICD-10-CM | POA: Diagnosis not present

## 2020-08-19 DIAGNOSIS — Z992 Dependence on renal dialysis: Secondary | ICD-10-CM | POA: Diagnosis not present

## 2020-08-21 DIAGNOSIS — N2581 Secondary hyperparathyroidism of renal origin: Secondary | ICD-10-CM | POA: Diagnosis not present

## 2020-08-21 DIAGNOSIS — D631 Anemia in chronic kidney disease: Secondary | ICD-10-CM | POA: Diagnosis not present

## 2020-08-21 DIAGNOSIS — N186 End stage renal disease: Secondary | ICD-10-CM | POA: Diagnosis not present

## 2020-08-21 DIAGNOSIS — D509 Iron deficiency anemia, unspecified: Secondary | ICD-10-CM | POA: Diagnosis not present

## 2020-08-21 DIAGNOSIS — Z992 Dependence on renal dialysis: Secondary | ICD-10-CM | POA: Diagnosis not present

## 2020-08-24 DIAGNOSIS — Z992 Dependence on renal dialysis: Secondary | ICD-10-CM | POA: Diagnosis not present

## 2020-08-24 DIAGNOSIS — D631 Anemia in chronic kidney disease: Secondary | ICD-10-CM | POA: Diagnosis not present

## 2020-08-24 DIAGNOSIS — N186 End stage renal disease: Secondary | ICD-10-CM | POA: Diagnosis not present

## 2020-08-24 DIAGNOSIS — D509 Iron deficiency anemia, unspecified: Secondary | ICD-10-CM | POA: Diagnosis not present

## 2020-08-24 DIAGNOSIS — N2581 Secondary hyperparathyroidism of renal origin: Secondary | ICD-10-CM | POA: Diagnosis not present

## 2020-08-25 DIAGNOSIS — Z992 Dependence on renal dialysis: Secondary | ICD-10-CM | POA: Diagnosis not present

## 2020-08-25 DIAGNOSIS — N186 End stage renal disease: Secondary | ICD-10-CM | POA: Diagnosis not present

## 2020-08-26 DIAGNOSIS — N186 End stage renal disease: Secondary | ICD-10-CM | POA: Diagnosis not present

## 2020-08-26 DIAGNOSIS — N2581 Secondary hyperparathyroidism of renal origin: Secondary | ICD-10-CM | POA: Diagnosis not present

## 2020-08-26 DIAGNOSIS — D509 Iron deficiency anemia, unspecified: Secondary | ICD-10-CM | POA: Diagnosis not present

## 2020-08-26 DIAGNOSIS — D631 Anemia in chronic kidney disease: Secondary | ICD-10-CM | POA: Diagnosis not present

## 2020-08-26 DIAGNOSIS — Z992 Dependence on renal dialysis: Secondary | ICD-10-CM | POA: Diagnosis not present

## 2020-08-28 DIAGNOSIS — D509 Iron deficiency anemia, unspecified: Secondary | ICD-10-CM | POA: Diagnosis not present

## 2020-08-28 DIAGNOSIS — N2581 Secondary hyperparathyroidism of renal origin: Secondary | ICD-10-CM | POA: Diagnosis not present

## 2020-08-28 DIAGNOSIS — Z992 Dependence on renal dialysis: Secondary | ICD-10-CM | POA: Diagnosis not present

## 2020-08-28 DIAGNOSIS — D631 Anemia in chronic kidney disease: Secondary | ICD-10-CM | POA: Diagnosis not present

## 2020-08-28 DIAGNOSIS — N186 End stage renal disease: Secondary | ICD-10-CM | POA: Diagnosis not present

## 2020-08-31 DIAGNOSIS — D509 Iron deficiency anemia, unspecified: Secondary | ICD-10-CM | POA: Diagnosis not present

## 2020-08-31 DIAGNOSIS — N186 End stage renal disease: Secondary | ICD-10-CM | POA: Diagnosis not present

## 2020-08-31 DIAGNOSIS — N2581 Secondary hyperparathyroidism of renal origin: Secondary | ICD-10-CM | POA: Diagnosis not present

## 2020-08-31 DIAGNOSIS — Z992 Dependence on renal dialysis: Secondary | ICD-10-CM | POA: Diagnosis not present

## 2020-08-31 DIAGNOSIS — D631 Anemia in chronic kidney disease: Secondary | ICD-10-CM | POA: Diagnosis not present

## 2020-09-02 DIAGNOSIS — N2581 Secondary hyperparathyroidism of renal origin: Secondary | ICD-10-CM | POA: Diagnosis not present

## 2020-09-02 DIAGNOSIS — N186 End stage renal disease: Secondary | ICD-10-CM | POA: Diagnosis not present

## 2020-09-02 DIAGNOSIS — Z992 Dependence on renal dialysis: Secondary | ICD-10-CM | POA: Diagnosis not present

## 2020-09-02 DIAGNOSIS — D509 Iron deficiency anemia, unspecified: Secondary | ICD-10-CM | POA: Diagnosis not present

## 2020-09-02 DIAGNOSIS — D631 Anemia in chronic kidney disease: Secondary | ICD-10-CM | POA: Diagnosis not present

## 2020-09-04 DIAGNOSIS — N186 End stage renal disease: Secondary | ICD-10-CM | POA: Diagnosis not present

## 2020-09-04 DIAGNOSIS — Z992 Dependence on renal dialysis: Secondary | ICD-10-CM | POA: Diagnosis not present

## 2020-09-04 DIAGNOSIS — D509 Iron deficiency anemia, unspecified: Secondary | ICD-10-CM | POA: Diagnosis not present

## 2020-09-04 DIAGNOSIS — D631 Anemia in chronic kidney disease: Secondary | ICD-10-CM | POA: Diagnosis not present

## 2020-09-04 DIAGNOSIS — N2581 Secondary hyperparathyroidism of renal origin: Secondary | ICD-10-CM | POA: Diagnosis not present

## 2020-09-07 DIAGNOSIS — N186 End stage renal disease: Secondary | ICD-10-CM | POA: Diagnosis not present

## 2020-09-07 DIAGNOSIS — D631 Anemia in chronic kidney disease: Secondary | ICD-10-CM | POA: Diagnosis not present

## 2020-09-07 DIAGNOSIS — D509 Iron deficiency anemia, unspecified: Secondary | ICD-10-CM | POA: Diagnosis not present

## 2020-09-07 DIAGNOSIS — Z992 Dependence on renal dialysis: Secondary | ICD-10-CM | POA: Diagnosis not present

## 2020-09-07 DIAGNOSIS — N2581 Secondary hyperparathyroidism of renal origin: Secondary | ICD-10-CM | POA: Diagnosis not present

## 2020-09-09 DIAGNOSIS — D509 Iron deficiency anemia, unspecified: Secondary | ICD-10-CM | POA: Diagnosis not present

## 2020-09-09 DIAGNOSIS — D631 Anemia in chronic kidney disease: Secondary | ICD-10-CM | POA: Diagnosis not present

## 2020-09-09 DIAGNOSIS — N186 End stage renal disease: Secondary | ICD-10-CM | POA: Diagnosis not present

## 2020-09-09 DIAGNOSIS — Z992 Dependence on renal dialysis: Secondary | ICD-10-CM | POA: Diagnosis not present

## 2020-09-09 DIAGNOSIS — N2581 Secondary hyperparathyroidism of renal origin: Secondary | ICD-10-CM | POA: Diagnosis not present

## 2020-09-10 ENCOUNTER — Encounter: Payer: Self-pay | Admitting: *Deleted

## 2020-09-11 DIAGNOSIS — D631 Anemia in chronic kidney disease: Secondary | ICD-10-CM | POA: Diagnosis not present

## 2020-09-11 DIAGNOSIS — Z992 Dependence on renal dialysis: Secondary | ICD-10-CM | POA: Diagnosis not present

## 2020-09-11 DIAGNOSIS — N186 End stage renal disease: Secondary | ICD-10-CM | POA: Diagnosis not present

## 2020-09-11 DIAGNOSIS — N2581 Secondary hyperparathyroidism of renal origin: Secondary | ICD-10-CM | POA: Diagnosis not present

## 2020-09-11 DIAGNOSIS — D509 Iron deficiency anemia, unspecified: Secondary | ICD-10-CM | POA: Diagnosis not present

## 2020-09-14 DIAGNOSIS — D631 Anemia in chronic kidney disease: Secondary | ICD-10-CM | POA: Diagnosis not present

## 2020-09-14 DIAGNOSIS — Z992 Dependence on renal dialysis: Secondary | ICD-10-CM | POA: Diagnosis not present

## 2020-09-14 DIAGNOSIS — D509 Iron deficiency anemia, unspecified: Secondary | ICD-10-CM | POA: Diagnosis not present

## 2020-09-14 DIAGNOSIS — N2581 Secondary hyperparathyroidism of renal origin: Secondary | ICD-10-CM | POA: Diagnosis not present

## 2020-09-14 DIAGNOSIS — N186 End stage renal disease: Secondary | ICD-10-CM | POA: Diagnosis not present

## 2020-09-16 DIAGNOSIS — D509 Iron deficiency anemia, unspecified: Secondary | ICD-10-CM | POA: Diagnosis not present

## 2020-09-16 DIAGNOSIS — N186 End stage renal disease: Secondary | ICD-10-CM | POA: Diagnosis not present

## 2020-09-16 DIAGNOSIS — N2581 Secondary hyperparathyroidism of renal origin: Secondary | ICD-10-CM | POA: Diagnosis not present

## 2020-09-16 DIAGNOSIS — Z992 Dependence on renal dialysis: Secondary | ICD-10-CM | POA: Diagnosis not present

## 2020-09-16 DIAGNOSIS — D631 Anemia in chronic kidney disease: Secondary | ICD-10-CM | POA: Diagnosis not present

## 2020-09-17 ENCOUNTER — Other Ambulatory Visit: Payer: Self-pay | Admitting: Internal Medicine

## 2020-09-17 DIAGNOSIS — I10 Essential (primary) hypertension: Secondary | ICD-10-CM

## 2020-09-18 DIAGNOSIS — Z992 Dependence on renal dialysis: Secondary | ICD-10-CM | POA: Diagnosis not present

## 2020-09-18 DIAGNOSIS — N2581 Secondary hyperparathyroidism of renal origin: Secondary | ICD-10-CM | POA: Diagnosis not present

## 2020-09-18 DIAGNOSIS — N186 End stage renal disease: Secondary | ICD-10-CM | POA: Diagnosis not present

## 2020-09-18 DIAGNOSIS — D509 Iron deficiency anemia, unspecified: Secondary | ICD-10-CM | POA: Diagnosis not present

## 2020-09-18 DIAGNOSIS — D631 Anemia in chronic kidney disease: Secondary | ICD-10-CM | POA: Diagnosis not present

## 2020-09-21 DIAGNOSIS — N186 End stage renal disease: Secondary | ICD-10-CM | POA: Diagnosis not present

## 2020-09-21 DIAGNOSIS — D509 Iron deficiency anemia, unspecified: Secondary | ICD-10-CM | POA: Diagnosis not present

## 2020-09-21 DIAGNOSIS — Z992 Dependence on renal dialysis: Secondary | ICD-10-CM | POA: Diagnosis not present

## 2020-09-21 DIAGNOSIS — D631 Anemia in chronic kidney disease: Secondary | ICD-10-CM | POA: Diagnosis not present

## 2020-09-21 DIAGNOSIS — N2581 Secondary hyperparathyroidism of renal origin: Secondary | ICD-10-CM | POA: Diagnosis not present

## 2020-09-22 ENCOUNTER — Other Ambulatory Visit: Payer: Self-pay | Admitting: Internal Medicine

## 2020-09-22 DIAGNOSIS — I10 Essential (primary) hypertension: Secondary | ICD-10-CM

## 2020-09-22 MED ORDER — NIFEDIPINE ER OSMOTIC RELEASE 30 MG PO TB24: ORAL_TABLET | ORAL | 0 refills | Status: DC

## 2020-09-22 MED ORDER — LABETALOL HCL 300 MG PO TABS: 300.0000 mg | ORAL_TABLET | Freq: Two times a day (BID) | ORAL | 0 refills | Status: DC

## 2020-09-23 ENCOUNTER — Encounter: Payer: Self-pay | Admitting: Nurse Practitioner

## 2020-09-23 ENCOUNTER — Telehealth (INDEPENDENT_AMBULATORY_CARE_PROVIDER_SITE_OTHER): Payer: Medicare Other | Admitting: Nurse Practitioner

## 2020-09-23 ENCOUNTER — Other Ambulatory Visit: Payer: Self-pay

## 2020-09-23 ENCOUNTER — Telehealth: Payer: Medicare Other

## 2020-09-23 DIAGNOSIS — Z20822 Contact with and (suspected) exposure to covid-19: Secondary | ICD-10-CM | POA: Diagnosis not present

## 2020-09-23 DIAGNOSIS — N186 End stage renal disease: Secondary | ICD-10-CM | POA: Diagnosis not present

## 2020-09-23 DIAGNOSIS — Z992 Dependence on renal dialysis: Secondary | ICD-10-CM | POA: Diagnosis not present

## 2020-09-23 DIAGNOSIS — J069 Acute upper respiratory infection, unspecified: Secondary | ICD-10-CM

## 2020-09-23 DIAGNOSIS — J029 Acute pharyngitis, unspecified: Secondary | ICD-10-CM | POA: Diagnosis not present

## 2020-09-23 DIAGNOSIS — N2581 Secondary hyperparathyroidism of renal origin: Secondary | ICD-10-CM | POA: Diagnosis not present

## 2020-09-23 DIAGNOSIS — D509 Iron deficiency anemia, unspecified: Secondary | ICD-10-CM | POA: Diagnosis not present

## 2020-09-23 DIAGNOSIS — Z01812 Encounter for preprocedural laboratory examination: Secondary | ICD-10-CM

## 2020-09-23 DIAGNOSIS — D631 Anemia in chronic kidney disease: Secondary | ICD-10-CM | POA: Diagnosis not present

## 2020-09-23 LAB — POCT RAPID STREP A (OFFICE): Rapid Strep A Screen: POSITIVE — AB

## 2020-09-23 MED ORDER — AMOXICILLIN-POT CLAVULANATE 500-125 MG PO TABS: 1.0000 | ORAL_TABLET | Freq: Every day | ORAL | 0 refills | Status: DC

## 2020-09-23 NOTE — Progress Notes (Signed)
I sent in augmentin for her strep throat.

## 2020-09-23 NOTE — Progress Notes (Signed)
Acute Office Visit  Subjective:    Patient ID: Janet Mitchell, female    DOB: 09/30/1965, 55 y.o.   MRN: [id number]  Chief Complaint  Patient presents with   Sore Throat    Sore throat cough runny nose has been going on for 3 days has tried chloropeptic spray and cough drops these do not help it numbs the area but she cant swallow     Sore Throat  Associated symptoms include coughing. Pertinent negatives include no congestion.  Patient is in today for sore throat. Has been ongoing x 3 days. She has been using cough drops and chloraseptic spray. Denies redness or oral lesions.  Past Medical History:  Diagnosis Date   Abdominal wall hematoma 12/26/2019   Anemia    Arthritis    CHF (congestive heart failure) ([place])    pt states she has been cleared of heart failure/disease   Chronic cholecystitis with calculus    Chronic kidney disease    Phreesia 12/12/2019   COVID-19 03/26/2019   COVID-19 virus infection 03/2019   ESRD (end stage renal disease) on dialysis [facility])    Dialysis M-W-F   Headache    "a few/wk" (03/09/2018) - no longer having these   History of blood transfusion 10/2017   "low blood count" (03/09/2018)   Hypertension    Seasonal allergies    Spinal headache    Type II diabetes mellitus ([place])     Past Surgical History:  Procedure Laterality Date   AMPUTATION TOE Left 2013   Great toe   AV FISTULA PLACEMENT Left 07/22/2019   Procedure: LEFT ARM BASILIC ARTERIOVENOUS (AV) FISTULA CREATION;  Surgeon: Rosetta Posner, MD;  Location: [place];  Service: Vascular;  Laterality: Left;   [place] Left 10/17/2019   Procedure: LEFT SECOND STAGE [place];  Surgeon: Rosetta Posner, MD;  Location: [place];  Service: Vascular;  Laterality: Left;   BIOPSY  10/24/2018   Procedure: BIOPSY;  Surgeon: Daneil Dolin, MD;  [facility];  Service: Endoscopy;;  right and left colon   CATARACT EXTRACTION W/ INTRAOCULAR LENS IMPLANT Right     [place]; [place] N/A 03/09/2018   Procedure: LAPAROSCOPIC CHOLECYSTECTOMY WITH INTRAOPERATIVE CHOLANGIOGRAM ERAS PATHWAY;  Surgeon: Donnie Mesa, MD;  Location: [place];  Service: General;  Laterality: N/A;   COLONOSCOPY N/A 10/24/2018   Procedure: COLONOSCOPY;  Surgeon: Daneil Dolin, MD;  [facility];  Service: Endoscopy;  Laterality: N/A;  1:00pm   EYE SURGERY Left 05/15/2018   Removal of blood in the globe (due to DM)   FLEXIBLE SIGMOIDOSCOPY N/A 11/24/2017   Procedure: FLEXIBLE SIGMOIDOSCOPY;  Surgeon: Daneil Dolin, MD;  [facility];  Service: Endoscopy;  Laterality: N/A;   IR FLUORO GUIDE CV LINE RIGHT  07/17/2019   IR PARACENTESIS  07/09/2019   IR US GUIDE VASC ACCESS RIGHT  07/17/2019   LAPAROSCOPIC CHOLECYSTECTOMY  03/09/2018   PERITONEAL CATHETER INSERTION  2017   POLYPECTOMY  10/24/2018   Procedure: POLYPECTOMY;  Surgeon: Daneil Dolin, MD;  [facility];  Service: Endoscopy;;   TOTAL HIP ARTHROPLASTY Left 1997    Family History  Problem Relation Age of Onset   Heart disease Mother    Thrombocytopenia Mother        TTP   Heart failure Father    Kidney disease Paternal Grandfather    Colon cancer Neg Hx  Social History   Socioeconomic History   Marital status: Married    Spouse name: Not on file   Number of children: 2   Years of education: Not on file   Highest education level: Not on file  Occupational History   Not on file  Tobacco Use   Smoking status: Never    Passive exposure: Yes   Smokeless tobacco: Never  Vaping Use   Vaping Use: Never used  Substance and Sexual Activity   Alcohol use: Never   Drug use: Never   Sexual activity: Yes    Birth control/protection: I.U.D.    Comment: Mirena IUD  Other Topics Concern   Not on file  Social History Narrative   Not on file   Social Determinants of Health   Financial Resource Strain: Low Risk    Difficulty of Paying Living  Expenses: Not hard at all  Food Insecurity: No Food Insecurity   Worried About Charity fundraiser in the Last Year: Never true   Arboriculturist in the Last Year: Never true  Transportation Needs: No Transportation Needs   Lack of Transportation (Medical): No   Lack of Transportation (Non-Medical): No  Physical Activity: Inactive   Days of Exercise per Week: 0 days   Minutes of Exercise per Session: 0 min  Stress: No Stress Concern Present   Feeling of Stress : Not at all  Social Connections: Socially Integrated   Frequency of Communication with Friends and Family: More than three times a week   Frequency of Social Gatherings with Friends and Family: More than three times a week   Attends Religious Services: More than 4 times per year   Active Member of [employer] or Organizations: Yes   Attends Music therapist: More than 4 times per year   Marital Status: Married  Human resources officer Violence: Not At Risk   Fear of Current or Ex-Partner: No   Emotionally Abused: No   Physically Abused: No   Sexually Abused: No    Outpatient Medications Prior to Visit  Medication Sig Dispense Refill   Accu-Chek Softclix Lancets lancets Use as instructed 100 each 12   acetaminophen (TYLENOL) 325 MG tablet Take 650 mg by mouth every 6 (six) hours as needed (pain).     aspirin EC 81 MG tablet Take 1 tablet (81 mg total) by mouth daily.     B Complex-C-Folic Acid (RENA-VITE PO) Take 1 tablet by mouth daily.     Blood Glucose Monitoring Suppl (ACCU-CHEK GUIDE) w/Device KIT 1 Piece by Does not apply route as directed. 1 kit 0   calcitRIOL (ROCALTROL) 0.5 MCG capsule Take 1-2 capsules (0.5-1 mcg total) by mouth See admin instructions. 0.70mg on Mondays through Fridays and take 131m on Saturdays and Sundays 90 capsule 1   calcium acetate (PHOSLO) 667 MG capsule Take by mouth.     cetirizine (ZYRTEC) 10 MG tablet TAKE 1 TABLET BY MOUTH ONCE DAILY. 30 tablet 0   cloNIDine (CATAPRES) 0.2 MG tablet  TAKE (1) TABLET BY MOUTH TWICE DAILY. 60 tablet 1   glucose blood (ACCU-CHEK GUIDE) test strip Use as instructed 150 each 2   insulin glargine, 2 Unit Dial, (TOUJEO MAX SOLOSTAR) 300 UNIT/ML Solostar Pen Inject 10 Units into the skin daily.     labetalol (NORMODYNE) 300 MG tablet Take 1 tablet (300 mg total) by mouth 2 (two) times daily. 60 tablet 0   losartan (COZAAR) 100 MG tablet Take 1 tablet (100 mg total)  by mouth daily. 90 tablet 3   NIFEdipine (PROCARDIA-XL/NIFEDICAL-XL) 30 MG 24 hr tablet TAKE (1) TABLET BY MOUTH ON TUESDAY,THURSDAY, AND SATURDAY 30 tablet 0   No facility-administered medications prior to visit.    No Known Allergies  Review of Systems  Constitutional: Negative.   HENT:  Positive for rhinorrhea and sore throat. Negative for congestion, sinus pressure and sinus pain.   Respiratory:  Positive for cough.   Cardiovascular: Negative.       Objective:    Physical Exam  There were no vitals taken for this visit. Wt Readings from Last 3 Encounters:  08/04/20 209 lb 12.8 oz (95.2 kg)  07/14/20 215 lb (97.5 kg)  06/25/20 220 lb (99.8 kg)    Health Maintenance Due  Topic Date Due   Pneumococcal Vaccine 16-31 Years old (1 - PCV) Never done   Zoster Vaccines- Shingrix (1 of 2) Never done   COVID-19 Vaccine (3 - Moderna risk series) 09/26/2019    There are no preventive care reminders to display for this patient.   No results found for: TSH Lab Results  Component Value Date   WBC 4.8 10/17/2019   HGB 11.2 (L) 10/17/2019   HCT 33.0 (L) 10/17/2019   MCV 77.0 (L) 10/17/2019   PLT 167 10/17/2019   Lab Results  Component Value Date   NA 138 10/17/2019   K 4.7 10/17/2019   CO2 30 08/29/2019   GLUCOSE 221 (H) 10/17/2019   BUN 42 (H) 10/17/2019   CREATININE 4.70 (H) 10/17/2019   BILITOT 0.5 08/29/2019   ALKPHOS 63 07/30/2019   AST 16 08/29/2019   ALT 15 08/29/2019   PROT 8.1 08/29/2019   ALBUMIN 2.4 (L) 07/30/2019   CALCIUM 9.2 08/29/2019   ANIONGAP  11 07/30/2019   Lab Results  Component Value Date   CHOL 114 08/29/2019   Lab Results  Component Value Date   HDL 35 (L) 08/29/2019   Lab Results  Component Value Date   LDLCALC 62 08/29/2019   Lab Results  Component Value Date   TRIG 91 08/29/2019   Lab Results  Component Value Date   CHOLHDL 3.3 08/29/2019   Lab Results  Component Value Date   HGBA1C 6.4 06/09/2020       Assessment & Plan:   Problem List Items Addressed This Visit       Respiratory   URI (upper respiratory infection)    -ongoing x 3 days; she has poor renal function -will swab for COVID and strep today; medication based on labs         No orders of the defined types were placed in this encounter.  Date:  09/23/2020   Location of Patient: Home Location of Provider: Office Consent was obtain for visit to be over via telehealth. I verified that I am speaking with the correct person using two identifiers.  I connected with  Lina Hitch Pinnix on 09/23/20 via telephone and verified that I am speaking with the correct person using two identifiers.   I discussed the limitations of evaluation and management by telemedicine. The patient expressed understanding and agreed to proceed.  Time spent: 8 minutes   Noreene Larsson, NP

## 2020-09-23 NOTE — Assessment & Plan Note (Addendum)
-  ongoing x 3 days; she has poor renal function -will swab for COVID and strep today -STREP positive -Rx. augmentin- renally dosed d/t ESRD on HD

## 2020-09-24 DIAGNOSIS — N186 End stage renal disease: Secondary | ICD-10-CM | POA: Diagnosis not present

## 2020-09-24 DIAGNOSIS — Z992 Dependence on renal dialysis: Secondary | ICD-10-CM | POA: Diagnosis not present

## 2020-09-25 DIAGNOSIS — Z992 Dependence on renal dialysis: Secondary | ICD-10-CM | POA: Diagnosis not present

## 2020-09-25 DIAGNOSIS — N186 End stage renal disease: Secondary | ICD-10-CM | POA: Diagnosis not present

## 2020-09-25 DIAGNOSIS — D631 Anemia in chronic kidney disease: Secondary | ICD-10-CM | POA: Diagnosis not present

## 2020-09-25 DIAGNOSIS — D509 Iron deficiency anemia, unspecified: Secondary | ICD-10-CM | POA: Diagnosis not present

## 2020-09-25 DIAGNOSIS — N2581 Secondary hyperparathyroidism of renal origin: Secondary | ICD-10-CM | POA: Diagnosis not present

## 2020-09-25 LAB — SARS-COV-2, NAA 2 DAY TAT

## 2020-09-25 LAB — NOVEL CORONAVIRUS, NAA: SARS-CoV-2, NAA: NOT DETECTED

## 2020-09-28 DIAGNOSIS — N2581 Secondary hyperparathyroidism of renal origin: Secondary | ICD-10-CM | POA: Diagnosis not present

## 2020-09-28 DIAGNOSIS — D631 Anemia in chronic kidney disease: Secondary | ICD-10-CM | POA: Diagnosis not present

## 2020-09-28 DIAGNOSIS — N186 End stage renal disease: Secondary | ICD-10-CM | POA: Diagnosis not present

## 2020-09-28 DIAGNOSIS — Z992 Dependence on renal dialysis: Secondary | ICD-10-CM | POA: Diagnosis not present

## 2020-09-28 DIAGNOSIS — D509 Iron deficiency anemia, unspecified: Secondary | ICD-10-CM | POA: Diagnosis not present

## 2020-09-30 DIAGNOSIS — D631 Anemia in chronic kidney disease: Secondary | ICD-10-CM | POA: Diagnosis not present

## 2020-09-30 DIAGNOSIS — Z992 Dependence on renal dialysis: Secondary | ICD-10-CM | POA: Diagnosis not present

## 2020-09-30 DIAGNOSIS — N2581 Secondary hyperparathyroidism of renal origin: Secondary | ICD-10-CM | POA: Diagnosis not present

## 2020-09-30 DIAGNOSIS — D509 Iron deficiency anemia, unspecified: Secondary | ICD-10-CM | POA: Diagnosis not present

## 2020-09-30 DIAGNOSIS — N186 End stage renal disease: Secondary | ICD-10-CM | POA: Diagnosis not present

## 2020-10-02 DIAGNOSIS — Z992 Dependence on renal dialysis: Secondary | ICD-10-CM | POA: Diagnosis not present

## 2020-10-02 DIAGNOSIS — D631 Anemia in chronic kidney disease: Secondary | ICD-10-CM | POA: Diagnosis not present

## 2020-10-02 DIAGNOSIS — N186 End stage renal disease: Secondary | ICD-10-CM | POA: Diagnosis not present

## 2020-10-02 DIAGNOSIS — D509 Iron deficiency anemia, unspecified: Secondary | ICD-10-CM | POA: Diagnosis not present

## 2020-10-02 DIAGNOSIS — N2581 Secondary hyperparathyroidism of renal origin: Secondary | ICD-10-CM | POA: Diagnosis not present

## 2020-10-05 DIAGNOSIS — Z992 Dependence on renal dialysis: Secondary | ICD-10-CM | POA: Diagnosis not present

## 2020-10-05 DIAGNOSIS — E119 Type 2 diabetes mellitus without complications: Secondary | ICD-10-CM | POA: Diagnosis not present

## 2020-10-05 DIAGNOSIS — D631 Anemia in chronic kidney disease: Secondary | ICD-10-CM | POA: Diagnosis not present

## 2020-10-05 DIAGNOSIS — D509 Iron deficiency anemia, unspecified: Secondary | ICD-10-CM | POA: Diagnosis not present

## 2020-10-05 DIAGNOSIS — Z794 Long term (current) use of insulin: Secondary | ICD-10-CM | POA: Diagnosis not present

## 2020-10-05 DIAGNOSIS — N186 End stage renal disease: Secondary | ICD-10-CM | POA: Diagnosis not present

## 2020-10-05 DIAGNOSIS — N2581 Secondary hyperparathyroidism of renal origin: Secondary | ICD-10-CM | POA: Diagnosis not present

## 2020-10-07 DIAGNOSIS — D631 Anemia in chronic kidney disease: Secondary | ICD-10-CM | POA: Diagnosis not present

## 2020-10-07 DIAGNOSIS — D509 Iron deficiency anemia, unspecified: Secondary | ICD-10-CM | POA: Diagnosis not present

## 2020-10-07 DIAGNOSIS — N2581 Secondary hyperparathyroidism of renal origin: Secondary | ICD-10-CM | POA: Diagnosis not present

## 2020-10-07 DIAGNOSIS — Z992 Dependence on renal dialysis: Secondary | ICD-10-CM | POA: Diagnosis not present

## 2020-10-07 DIAGNOSIS — N186 End stage renal disease: Secondary | ICD-10-CM | POA: Diagnosis not present

## 2020-10-08 ENCOUNTER — Ambulatory Visit: Payer: Medicare Other | Admitting: Internal Medicine

## 2020-10-08 DIAGNOSIS — Z7682 Awaiting organ transplant status: Secondary | ICD-10-CM | POA: Diagnosis not present

## 2020-10-08 DIAGNOSIS — I120 Hypertensive chronic kidney disease with stage 5 chronic kidney disease or end stage renal disease: Secondary | ICD-10-CM | POA: Diagnosis not present

## 2020-10-08 DIAGNOSIS — Z1159 Encounter for screening for other viral diseases: Secondary | ICD-10-CM | POA: Diagnosis not present

## 2020-10-08 DIAGNOSIS — Z114 Encounter for screening for human immunodeficiency virus [HIV]: Secondary | ICD-10-CM | POA: Diagnosis not present

## 2020-10-08 DIAGNOSIS — R9431 Abnormal electrocardiogram [ECG] [EKG]: Secondary | ICD-10-CM | POA: Diagnosis not present

## 2020-10-08 DIAGNOSIS — E1122 Type 2 diabetes mellitus with diabetic chronic kidney disease: Secondary | ICD-10-CM | POA: Diagnosis not present

## 2020-10-08 DIAGNOSIS — Z6830 Body mass index (BMI) 30.0-30.9, adult: Secondary | ICD-10-CM | POA: Diagnosis not present

## 2020-10-08 DIAGNOSIS — N186 End stage renal disease: Secondary | ICD-10-CM | POA: Diagnosis not present

## 2020-10-08 DIAGNOSIS — Z01818 Encounter for other preprocedural examination: Secondary | ICD-10-CM | POA: Diagnosis not present

## 2020-10-08 DIAGNOSIS — E669 Obesity, unspecified: Secondary | ICD-10-CM | POA: Diagnosis not present

## 2020-10-08 DIAGNOSIS — E1121 Type 2 diabetes mellitus with diabetic nephropathy: Secondary | ICD-10-CM | POA: Diagnosis not present

## 2020-10-08 DIAGNOSIS — Z992 Dependence on renal dialysis: Secondary | ICD-10-CM | POA: Diagnosis not present

## 2020-10-09 ENCOUNTER — Ambulatory Visit: Payer: Medicare Other | Admitting: Internal Medicine

## 2020-10-09 DIAGNOSIS — N186 End stage renal disease: Secondary | ICD-10-CM | POA: Diagnosis not present

## 2020-10-09 DIAGNOSIS — D631 Anemia in chronic kidney disease: Secondary | ICD-10-CM | POA: Diagnosis not present

## 2020-10-09 DIAGNOSIS — N2581 Secondary hyperparathyroidism of renal origin: Secondary | ICD-10-CM | POA: Diagnosis not present

## 2020-10-09 DIAGNOSIS — Z992 Dependence on renal dialysis: Secondary | ICD-10-CM | POA: Diagnosis not present

## 2020-10-09 DIAGNOSIS — D509 Iron deficiency anemia, unspecified: Secondary | ICD-10-CM | POA: Diagnosis not present

## 2020-10-12 DIAGNOSIS — N2581 Secondary hyperparathyroidism of renal origin: Secondary | ICD-10-CM | POA: Diagnosis not present

## 2020-10-12 DIAGNOSIS — D509 Iron deficiency anemia, unspecified: Secondary | ICD-10-CM | POA: Diagnosis not present

## 2020-10-12 DIAGNOSIS — N186 End stage renal disease: Secondary | ICD-10-CM | POA: Diagnosis not present

## 2020-10-12 DIAGNOSIS — D631 Anemia in chronic kidney disease: Secondary | ICD-10-CM | POA: Diagnosis not present

## 2020-10-12 DIAGNOSIS — Z992 Dependence on renal dialysis: Secondary | ICD-10-CM | POA: Diagnosis not present

## 2020-10-14 DIAGNOSIS — Z992 Dependence on renal dialysis: Secondary | ICD-10-CM | POA: Diagnosis not present

## 2020-10-14 DIAGNOSIS — D631 Anemia in chronic kidney disease: Secondary | ICD-10-CM | POA: Diagnosis not present

## 2020-10-14 DIAGNOSIS — D509 Iron deficiency anemia, unspecified: Secondary | ICD-10-CM | POA: Diagnosis not present

## 2020-10-14 DIAGNOSIS — N2581 Secondary hyperparathyroidism of renal origin: Secondary | ICD-10-CM | POA: Diagnosis not present

## 2020-10-14 DIAGNOSIS — N186 End stage renal disease: Secondary | ICD-10-CM | POA: Diagnosis not present

## 2020-10-16 DIAGNOSIS — N2581 Secondary hyperparathyroidism of renal origin: Secondary | ICD-10-CM | POA: Diagnosis not present

## 2020-10-16 DIAGNOSIS — D631 Anemia in chronic kidney disease: Secondary | ICD-10-CM | POA: Diagnosis not present

## 2020-10-16 DIAGNOSIS — D509 Iron deficiency anemia, unspecified: Secondary | ICD-10-CM | POA: Diagnosis not present

## 2020-10-16 DIAGNOSIS — Z992 Dependence on renal dialysis: Secondary | ICD-10-CM | POA: Diagnosis not present

## 2020-10-16 DIAGNOSIS — N186 End stage renal disease: Secondary | ICD-10-CM | POA: Diagnosis not present

## 2020-10-19 DIAGNOSIS — N186 End stage renal disease: Secondary | ICD-10-CM | POA: Diagnosis not present

## 2020-10-19 DIAGNOSIS — D631 Anemia in chronic kidney disease: Secondary | ICD-10-CM | POA: Diagnosis not present

## 2020-10-19 DIAGNOSIS — N2581 Secondary hyperparathyroidism of renal origin: Secondary | ICD-10-CM | POA: Diagnosis not present

## 2020-10-19 DIAGNOSIS — Z992 Dependence on renal dialysis: Secondary | ICD-10-CM | POA: Diagnosis not present

## 2020-10-19 DIAGNOSIS — D509 Iron deficiency anemia, unspecified: Secondary | ICD-10-CM | POA: Diagnosis not present

## 2020-10-20 ENCOUNTER — Ambulatory Visit: Payer: Medicare Other | Admitting: Podiatry

## 2020-10-21 DIAGNOSIS — D509 Iron deficiency anemia, unspecified: Secondary | ICD-10-CM | POA: Diagnosis not present

## 2020-10-21 DIAGNOSIS — D631 Anemia in chronic kidney disease: Secondary | ICD-10-CM | POA: Diagnosis not present

## 2020-10-21 DIAGNOSIS — N2581 Secondary hyperparathyroidism of renal origin: Secondary | ICD-10-CM | POA: Diagnosis not present

## 2020-10-21 DIAGNOSIS — Z992 Dependence on renal dialysis: Secondary | ICD-10-CM | POA: Diagnosis not present

## 2020-10-21 DIAGNOSIS — N186 End stage renal disease: Secondary | ICD-10-CM | POA: Diagnosis not present

## 2020-10-23 ENCOUNTER — Other Ambulatory Visit: Payer: Self-pay

## 2020-10-23 ENCOUNTER — Ambulatory Visit (INDEPENDENT_AMBULATORY_CARE_PROVIDER_SITE_OTHER): Payer: Medicare Other | Admitting: Podiatry

## 2020-10-23 ENCOUNTER — Ambulatory Visit: Payer: Medicare Other

## 2020-10-23 ENCOUNTER — Encounter: Payer: Self-pay | Admitting: Podiatry

## 2020-10-23 DIAGNOSIS — L97522 Non-pressure chronic ulcer of other part of left foot with fat layer exposed: Secondary | ICD-10-CM

## 2020-10-23 DIAGNOSIS — Z89412 Acquired absence of left great toe: Secondary | ICD-10-CM

## 2020-10-23 DIAGNOSIS — N186 End stage renal disease: Secondary | ICD-10-CM | POA: Diagnosis not present

## 2020-10-23 DIAGNOSIS — E08621 Diabetes mellitus due to underlying condition with foot ulcer: Secondary | ICD-10-CM

## 2020-10-23 DIAGNOSIS — E1142 Type 2 diabetes mellitus with diabetic polyneuropathy: Secondary | ICD-10-CM

## 2020-10-23 DIAGNOSIS — B351 Tinea unguium: Secondary | ICD-10-CM | POA: Diagnosis not present

## 2020-10-23 DIAGNOSIS — N2581 Secondary hyperparathyroidism of renal origin: Secondary | ICD-10-CM | POA: Diagnosis not present

## 2020-10-23 DIAGNOSIS — D509 Iron deficiency anemia, unspecified: Secondary | ICD-10-CM | POA: Diagnosis not present

## 2020-10-23 DIAGNOSIS — D631 Anemia in chronic kidney disease: Secondary | ICD-10-CM | POA: Diagnosis not present

## 2020-10-23 DIAGNOSIS — Z992 Dependence on renal dialysis: Secondary | ICD-10-CM | POA: Diagnosis not present

## 2020-10-23 MED ORDER — DOXYCYCLINE HYCLATE 100 MG PO CAPS: 100.0000 mg | ORAL_CAPSULE | Freq: Two times a day (BID) | ORAL | 0 refills | Status: AC

## 2020-10-23 MED ORDER — IODOSORB 0.9 % EX GEL: CUTANEOUS | 0 refills | Status: DC

## 2020-10-23 NOTE — Progress Notes (Addendum)
Subjective: Patient presents today for at-risk foot care with h/o amputation of left hallux (2013, [place], [place]). Patient also has h/o diabetes with ESRD on dialysis (MWF) .She is seen for her nail care on today as well.   New concern today: Development of lesion on plantar aspect of left foot about 2-3 weeks ago. Patient states she was wearing a pair of slip on shoes. She states her husband thinks this is what caused the lesion. Her husband thought she should come in sooner, but she waited until her appointment day. She denies seeing any redness, drainage or swelling. Denies any fever, chills, night sweats, nausea or vomiting. She has been cleaning it daily.      Lindell Spar, MD is patient's PCP. Last visit was 08/04/2020.  Current Outpatient Medications on File Prior to Visit  Medication Sig Dispense Refill   Accu-Chek Softclix Lancets lancets See admin instructions.     cetirizine (ZYRTEC) 10 MG tablet Take 1 tablet by mouth daily.     psyllium (METAMUCIL) 58.6 % packet Take by mouth.     acetaminophen (TYLENOL) 325 MG tablet Take 650 mg by mouth every 6 (six) hours as needed (pain).     aspirin EC 81 MG tablet Take 1 tablet (81 mg total) by mouth daily.     B Complex-C-Folic Acid (RENA-VITE PO) Take 1 tablet by mouth daily.     Blood Glucose Monitoring Suppl (ACCU-CHEK GUIDE) w/Device KIT 1 Piece by Does not apply route as directed. 1 kit 0   calcitRIOL (ROCALTROL) 0.5 MCG capsule Take 1-2 capsules (0.5-1 mcg total) by mouth See admin instructions. 0.7mg on Mondays through Fridays and take 147m on Saturdays and Sundays 90 capsule 1   calcium acetate (PHOSLO) 667 MG capsule Take by mouth.     cetirizine (ZYRTEC) 10 MG tablet TAKE 1 TABLET BY MOUTH ONCE DAILY. 30 tablet 0   cloNIDine (CATAPRES) 0.2 MG tablet TAKE (1) TABLET BY MOUTH TWICE DAILY. 60 tablet 1   glucose blood (ACCU-CHEK GUIDE) test strip Use as instructed 150 each 2   hydrALAZINE (APRESOLINE) 10 MG tablet Take 10 mg by  mouth 2 (two) times daily.     insulin glargine, 2 Unit Dial, (TOUJEO MAX SOLOSTAR) 300 UNIT/ML Solostar Pen Inject 10 Units into the skin daily.     labetalol (NORMODYNE) 300 MG tablet Take 1 tablet (300 mg total) by mouth 2 (two) times daily. 60 tablet 0   losartan (COZAAR) 100 MG tablet Take 1 tablet (100 mg total) by mouth daily. 90 tablet 3   NIFEdipine (PROCARDIA-XL/NIFEDICAL-XL) 30 MG 24 hr tablet TAKE (1) TABLET BY MOUTH ON TUESDAY,THURSDAY, AND SATURDAY 30 tablet 0   No current facility-administered medications on file prior to visit.     No Known Allergies   Objective: There were no vitals filed for this visit.  ShMammie Merasinnix is a pleasant [age].o. female obese in NAD. AAO X 3.  Vascular Examination: Capillary fill time to digits <3 seconds b/l lower extremities. Palpable DP pulse(s) b/l lower extremities Palpable PT pulse(s) b/l lower extremities Pedal hair absent. Lower extremity skin temperature gradient within normal limits. No pain with calf compression b/l. No edema noted b/l lower extremities. No ischemia or gangrene noted b/l lower extremities.  Dermatological Examination: No interdigital macerations b/l lower extremities. Toenails 2-5 bilaterally and R hallux elongated, discolored, dystrophic, thickened, and crumbly with subungual debris and tenderness to dorsal palpation.          Wound Location: submet  head 1 left foot There is a moderate amount of devitalized tissue present in the wound. Predebridement Wound Measurement:  2.5  x 2.9 cm protruding callus with subdermal hemorrhage. Postdebridement Wound Measurement: 0.5 x 0.5 x 0.2 cm. Wound Base: Granular/Healthy Odor: Slight malodor Peri-wound: Normal Exudate: Scant/small amount Bloody exudate Blood Loss during debridement: <0.5  cc('s). Material in wound which inhibits healing/promotes adjacent tissue breakdown:  exuberant hyperkeratosis. Description of tissue removed from ulceration today:   exuberant hyperkeratosis. Sign(s) of clinical bacterial infection: no clinical signs of infection noted on examination today.   Musculoskeletal Examination: Normal muscle strength 5/5 to all lower extremity muscle groups bilaterally. Lower extremity amputation(s): digital amputation L hallux.  Neurological Examination: Protective sensation diminished with 10g monofilament b/l.  Xray findings left foot: No gas in tissues left foot. No bone erosion noted at location of ulceration left foot. No foreign body evident left foot.  Assessment and Plan 1. Onychomycosis   2. Diabetic ulcer of left foot associated with diabetes mellitus due to underlying condition, with fat layer exposed, unspecified part of foot ([place])   3. Status post amputation of great toe, left ([place])   4. Diabetic peripheral neuropathy associated with type 2 diabetes mellitus (HCC)     - DG Foot Complete Left foot - WOUND CULTURE left foot ulcer taken today. - cadexomer iodine (IODOSORB) 0.9 % gel; Apply to left foot ulcer once daily.  Dispense: 40 g; Refill: 0 -Patient was evaluated and treated and all questions answered.  -Patient/POA/Family member educated on diagnosis and treatment plan of routine ulcer debridement/wound care.  -Ulceration debridement achieved utilizing sharp excisional debridement with sterile scalpel blade, sterile tissue nippers and sterile currette. -Type/amount of devitalized tissue removed: exuberant hyperkeratosis and underlying necrotic tissue. -Today's ulcer size post-debridement: 0.5 x 5 x 0.2 cm. -Ulceration cleansed with wound cleanser. Iodosorb Gel applied to base of ulceration and secured with light dressing. -Wound responded well to today's debridement. -Patient risk factors affecting healing of ulcer: uncontrolled diabetes, diabetic neuropathy, history of prior amputation -Gaynelle Cage Pinnix given written instructions on daily wound care for left foot ulceration. -Rx for Doxycyline 100  mg, #14, to be taken one capsule twice daily for 7 days. -Frequency of debridements needed to achieve healing: weekly to biweekly. -Wound culture and sensitivity ordered today. -Dispensed surgical shoe for left foot. -Toenails 2-5 bilaterally and R hallux debrided in length and girth without iatrogenic bleeding with sterile nail nipper and dremel.  -Patient to report any pedal injuries to medical professional immediately. -Patient scheduled to see Dr. Hardie Pulley in one week for follow up of left foot ulcer.. -Patient/POA to call should there be question/concern in the interim.  Return in about 1 week (around 10/30/2020).  Marzetta Board, DPM

## 2020-10-23 NOTE — Patient Instructions (Addendum)
DRESSING CHANGES left foot:   PHARMACY SHOPPING LIST: Saline or Wound Cleanser for cleaning wound 2 x 2 inch sterile gauze for cleaning wound Iodosorb Gel  A. IF DISPENSED, WEAR SURGICAL SHOE OR WALKING BOOT AT ALL TIMES.  B. IF PRESCRIBED ORAL ANTIBIOTICS, TAKE ALL MEDICATION AS PRESCRIBED UNTIL ALL ARE GONE.  C. IF DOCTOR HAS DESIGNATED NONWEIGHTBEARING STATUS, PLEASE ADHERE TO INSTRUCTIONS.  1. KEEP left foot DRY AT ALL TIMES!!!!  2. CLEANSE ULCER WITH SALINE OR WOUND CLEANSER.  3. DAB DRY WITH GAUZE SPONGE.  4. APPLY A LIGHT AMOUNT OF Iodosorb Gel TO BASE OF ULCER.  5. APPLY OUTER DRESSING AS INSTRUCTED.  6. WEAR SURGICAL SHOE/BOOT DAILY AT ALL TIMES. IF SUPPLIED, WEAR HEEL PROTECTORS AT ALL TIMES WHEN IN BED.  7. DO NOT WALK BAREFOOT!!!  8.  IF YOU EXPERIENCE ANY FEVER, CHILLS, NIGHTSWEATS, NAUSEA OR VOMITING, ELEVATED OR LOW BLOOD SUGARS, REPORT TO EMERGENCY ROOM.  9. IF YOU EXPERIENCE INCREASED REDNESS, PAIN, SWELLING, DISCOLORATION, ODOR, PUS, DRAINAGE OR WARMTH OF YOUR FOOT, REPORT TO EMERGENCY ROOM.  

## 2020-10-25 DIAGNOSIS — N186 End stage renal disease: Secondary | ICD-10-CM | POA: Diagnosis not present

## 2020-10-25 DIAGNOSIS — Z992 Dependence on renal dialysis: Secondary | ICD-10-CM | POA: Diagnosis not present

## 2020-10-26 DIAGNOSIS — D631 Anemia in chronic kidney disease: Secondary | ICD-10-CM | POA: Diagnosis not present

## 2020-10-26 DIAGNOSIS — D509 Iron deficiency anemia, unspecified: Secondary | ICD-10-CM | POA: Diagnosis not present

## 2020-10-26 DIAGNOSIS — Z992 Dependence on renal dialysis: Secondary | ICD-10-CM | POA: Diagnosis not present

## 2020-10-26 DIAGNOSIS — N2581 Secondary hyperparathyroidism of renal origin: Secondary | ICD-10-CM | POA: Diagnosis not present

## 2020-10-26 DIAGNOSIS — N186 End stage renal disease: Secondary | ICD-10-CM | POA: Diagnosis not present

## 2020-10-26 LAB — WOUND CULTURE
MICRO NUMBER:: 12179857
SPECIMEN QUALITY:: ADEQUATE

## 2020-10-27 ENCOUNTER — Ambulatory Visit: Payer: Medicare Other | Admitting: Podiatry

## 2020-10-28 DIAGNOSIS — N2581 Secondary hyperparathyroidism of renal origin: Secondary | ICD-10-CM | POA: Diagnosis not present

## 2020-10-28 DIAGNOSIS — N186 End stage renal disease: Secondary | ICD-10-CM | POA: Diagnosis not present

## 2020-10-28 DIAGNOSIS — Z992 Dependence on renal dialysis: Secondary | ICD-10-CM | POA: Diagnosis not present

## 2020-10-28 DIAGNOSIS — D509 Iron deficiency anemia, unspecified: Secondary | ICD-10-CM | POA: Diagnosis not present

## 2020-10-28 DIAGNOSIS — D631 Anemia in chronic kidney disease: Secondary | ICD-10-CM | POA: Diagnosis not present

## 2020-10-30 DIAGNOSIS — D509 Iron deficiency anemia, unspecified: Secondary | ICD-10-CM | POA: Diagnosis not present

## 2020-10-30 DIAGNOSIS — N2581 Secondary hyperparathyroidism of renal origin: Secondary | ICD-10-CM | POA: Diagnosis not present

## 2020-10-30 DIAGNOSIS — D631 Anemia in chronic kidney disease: Secondary | ICD-10-CM | POA: Diagnosis not present

## 2020-10-30 DIAGNOSIS — N186 End stage renal disease: Secondary | ICD-10-CM | POA: Diagnosis not present

## 2020-10-30 DIAGNOSIS — Z992 Dependence on renal dialysis: Secondary | ICD-10-CM | POA: Diagnosis not present

## 2020-11-02 DIAGNOSIS — N186 End stage renal disease: Secondary | ICD-10-CM | POA: Diagnosis not present

## 2020-11-02 DIAGNOSIS — D509 Iron deficiency anemia, unspecified: Secondary | ICD-10-CM | POA: Diagnosis not present

## 2020-11-02 DIAGNOSIS — D631 Anemia in chronic kidney disease: Secondary | ICD-10-CM | POA: Diagnosis not present

## 2020-11-02 DIAGNOSIS — N2581 Secondary hyperparathyroidism of renal origin: Secondary | ICD-10-CM | POA: Diagnosis not present

## 2020-11-02 DIAGNOSIS — Z992 Dependence on renal dialysis: Secondary | ICD-10-CM | POA: Diagnosis not present

## 2020-11-03 ENCOUNTER — Ambulatory Visit (INDEPENDENT_AMBULATORY_CARE_PROVIDER_SITE_OTHER): Payer: Medicare Other | Admitting: Podiatry

## 2020-11-03 DIAGNOSIS — Z5329 Procedure and treatment not carried out because of patient's decision for other reasons: Secondary | ICD-10-CM

## 2020-11-04 DIAGNOSIS — D631 Anemia in chronic kidney disease: Secondary | ICD-10-CM | POA: Diagnosis not present

## 2020-11-04 DIAGNOSIS — D509 Iron deficiency anemia, unspecified: Secondary | ICD-10-CM | POA: Diagnosis not present

## 2020-11-04 DIAGNOSIS — N186 End stage renal disease: Secondary | ICD-10-CM | POA: Diagnosis not present

## 2020-11-04 DIAGNOSIS — Z992 Dependence on renal dialysis: Secondary | ICD-10-CM | POA: Diagnosis not present

## 2020-11-04 DIAGNOSIS — N2581 Secondary hyperparathyroidism of renal origin: Secondary | ICD-10-CM | POA: Diagnosis not present

## 2020-11-06 DIAGNOSIS — D631 Anemia in chronic kidney disease: Secondary | ICD-10-CM | POA: Diagnosis not present

## 2020-11-06 DIAGNOSIS — Z992 Dependence on renal dialysis: Secondary | ICD-10-CM | POA: Diagnosis not present

## 2020-11-06 DIAGNOSIS — N186 End stage renal disease: Secondary | ICD-10-CM | POA: Diagnosis not present

## 2020-11-06 DIAGNOSIS — D509 Iron deficiency anemia, unspecified: Secondary | ICD-10-CM | POA: Diagnosis not present

## 2020-11-06 DIAGNOSIS — N2581 Secondary hyperparathyroidism of renal origin: Secondary | ICD-10-CM | POA: Diagnosis not present

## 2020-11-09 DIAGNOSIS — Z992 Dependence on renal dialysis: Secondary | ICD-10-CM | POA: Diagnosis not present

## 2020-11-09 DIAGNOSIS — D631 Anemia in chronic kidney disease: Secondary | ICD-10-CM | POA: Diagnosis not present

## 2020-11-09 DIAGNOSIS — D509 Iron deficiency anemia, unspecified: Secondary | ICD-10-CM | POA: Diagnosis not present

## 2020-11-09 DIAGNOSIS — N186 End stage renal disease: Secondary | ICD-10-CM | POA: Diagnosis not present

## 2020-11-09 DIAGNOSIS — N2581 Secondary hyperparathyroidism of renal origin: Secondary | ICD-10-CM | POA: Diagnosis not present

## 2020-11-10 ENCOUNTER — Encounter: Payer: Self-pay | Admitting: Internal Medicine

## 2020-11-10 ENCOUNTER — Ambulatory Visit: Payer: Medicare Other | Admitting: Podiatry

## 2020-11-10 ENCOUNTER — Other Ambulatory Visit: Payer: Self-pay

## 2020-11-10 ENCOUNTER — Ambulatory Visit (INDEPENDENT_AMBULATORY_CARE_PROVIDER_SITE_OTHER): Payer: Medicare Other | Admitting: Internal Medicine

## 2020-11-10 VITALS — BP 143/83

## 2020-11-10 DIAGNOSIS — E1122 Type 2 diabetes mellitus with diabetic chronic kidney disease: Secondary | ICD-10-CM

## 2020-11-10 DIAGNOSIS — N186 End stage renal disease: Secondary | ICD-10-CM | POA: Diagnosis not present

## 2020-11-10 DIAGNOSIS — R197 Diarrhea, unspecified: Secondary | ICD-10-CM

## 2020-11-10 DIAGNOSIS — I10 Essential (primary) hypertension: Secondary | ICD-10-CM

## 2020-11-10 MED ORDER — CHOLESTYRAMINE 4 G PO PACK: 2.0000 g | PACK | Freq: Every day | ORAL | 1 refills | Status: DC

## 2020-11-10 NOTE — Assessment & Plan Note (Addendum)
HbA1C: <6 at last check at Nephrology clinic Has not been Toujeo now Follows up with Nephrologist for ESRD on HD On kidney transplant list at [facility] - she was told she is not a candidate due to calcification.

## 2020-11-10 NOTE — Progress Notes (Addendum)
Virtual Visit via Telephone Note   This visit type was conducted due to national recommendations for restrictions regarding the COVID-19 Pandemic (e.g. social distancing) in an effort to limit this patient's exposure and mitigate transmission in our community.  Due to her co-morbid illnesses, this patient is at least at moderate risk for complications without adequate follow up.  This format is felt to be most appropriate for this patient at this time.  The patient did not have access to video technology/had technical difficulties with video requiring transitioning to audio format only (telephone).  All issues noted in this document were discussed and addressed.  No physical exam could be performed with this format.  Evaluation Performed:  Follow-up visit  Date:  11/10/2020   ID:  Janet Mitchell, DOB [birth year], MRN [id number]  Patient Location: Home Provider Location: Office/Clinic  Location of Patient: Home Location of Provider: Telehealth Consent was obtain for visit to be over via telehealth. I verified that I am speaking with the correct person using two identifiers.  PCP:  Lindell Spar, MD   Chief Complaint:  Follow up of chronic medical conditons  History of Present Illness:    Janet Mitchell is a 55 y.o. female with PMH of HTN and DM with ESRD on HD who has a televisit for follow up of her chronic medical conditions.  She has been having intermittent diarrhea chronically and had to change her appointment to televisit today. She has tried Colestipol and Questran in the past as per GI, and did better with Questran. She is not taking Questran currently. She denies any nausea, vomiting, abdominal pain, melena, hematochezia. She has had these symptoms since cholecystectomy.  HTN: BP is better now compared to prior. Takes medications regularly, which have been changed since last visit. Followed by Nephrology. Patient denies headache, dizziness, chest pain, dyspnea or  palpitations.  DM: Her HbA1C is <6 now and has not been using Toujeo now. Her blood glucose is well-controlled, around 100 most of the time. She denies any polyphagia or fatigue.   The patient does not have symptoms concerning for COVID-19 infection (fever, chills, cough, or new shortness of breath).   Past Medical, Surgical, Social History, Allergies, and Medications have been Reviewed.  Past Medical History:  Diagnosis Date   Abdominal wall hematoma 12/26/2019   Anemia    Arthritis    CHF (congestive heart failure) ([place])    pt states she has been cleared of heart failure/disease   Chronic cholecystitis with calculus    Chronic kidney disease    Phreesia 12/12/2019   COVID-19 03/26/2019   COVID-19 virus infection 03/2019   ESRD (end stage renal disease) on dialysis [facility])    Dialysis M-W-F   Headache    "a few/wk" (03/09/2018) - no longer having these   History of blood transfusion 10/2017   "low blood count" (03/09/2018)   Hypertension    Seasonal allergies    Spinal headache    Type II diabetes mellitus ([place])    Past Surgical History:  Procedure Laterality Date   AMPUTATION TOE Left 2013   Great toe   AV FISTULA PLACEMENT Left 07/22/2019   Procedure: LEFT ARM BASILIC ARTERIOVENOUS (AV) FISTULA CREATION;  Surgeon: Rosetta Posner, MD;  Location: [facility] OR;  Service: Vascular;  Laterality: Left;   [place] Left 10/17/2019   Procedure: LEFT SECOND STAGE [place];  Surgeon: Rosetta Posner, MD;  Location: [place];  Service: Vascular;  Laterality: Left;   BIOPSY  10/24/2018   Procedure: BIOPSY;  Surgeon: Daneil Dolin, MD;  Location: [facility] END[facility];  Service: Endoscopy;;  right and left colon   CATARACT EXTRACTION W/ INTRAOCULAR LENS IMPLANT Right    [place]; [place] N/A 03/09/2018   Procedure: LAPAROSCOPIC CHOLECYSTECTOMY WITH INTRAOPERATIVE CHOLANGIOGRAM ERAS PATHWAY;  Surgeon: Donnie Mesa, MD;  Location: [place];   Service: General;  Laterality: N/A;   COLONOSCOPY N/A 10/24/2018   Procedure: COLONOSCOPY;  Surgeon: Daneil Dolin, MD;  [facility];  Service: Endoscopy;  Laterality: N/A;  1:00pm   EYE SURGERY Left 05/15/2018   Removal of blood in the globe (due to DM)   FLEXIBLE SIGMOIDOSCOPY N/A 11/24/2017   Procedure: FLEXIBLE SIGMOIDOSCOPY;  Surgeon: Daneil Dolin, MD;  [facility];  Service: Endoscopy;  Laterality: N/A;   IR FLUORO GUIDE CV LINE RIGHT  07/17/2019   IR PARACENTESIS  07/09/2019   IR US GUIDE VASC ACCESS RIGHT  07/17/2019   LAPAROSCOPIC CHOLECYSTECTOMY  03/09/2018   PERITONEAL CATHETER INSERTION  2017   POLYPECTOMY  10/24/2018   Procedure: POLYPECTOMY;  Surgeon: Daneil Dolin, MD;  [facility];  Service: Endoscopy;;   TOTAL HIP ARTHROPLASTY Left 1997     Current Meds  Medication Sig   Accu-Chek Softclix Lancets lancets See admin instructions.   acetaminophen (TYLENOL) 325 MG tablet Take 650 mg by mouth every 6 (six) hours as needed (pain).   aspirin EC 81 MG tablet Take 1 tablet (81 mg total) by mouth daily.   B Complex-C-Folic Acid (RENA-VITE PO) Take 1 tablet by mouth daily.   Blood Glucose Monitoring Suppl (ACCU-CHEK GUIDE) w/Device KIT 1 Piece by Does not apply route as directed.   cadexomer iodine (IODOSORB) 0.9 % gel Apply to left foot ulcer once daily.   calcitRIOL (ROCALTROL) 0.5 MCG capsule Take 1-2 capsules (0.5-1 mcg total) by mouth See admin instructions. 0.25mg on Mondays through Fridays and take 115m on Saturdays and Sundays   calcium acetate (PHOSLO) 667 MG capsule Take by mouth.   cetirizine (ZYRTEC) 10 MG tablet TAKE 1 TABLET BY MOUTH ONCE DAILY.   cholestyramine (QUESTRAN) 4 g packet Take 0.5 packets (2 g total) by mouth daily.   glucose blood (ACCU-CHEK GUIDE) test strip Use as instructed   hydrALAZINE (APRESOLINE) 10 MG tablet Take 10 mg by mouth 2 (two) times daily.   insulin glargine, 2 Unit Dial, (TOUJEO MAX SOLOSTAR) 300  UNIT/ML Solostar Pen Inject 10 Units into the skin daily.   labetalol (NORMODYNE) 300 MG tablet Take 1 tablet (300 mg total) by mouth 2 (two) times daily.   losartan (COZAAR) 100 MG tablet Take 1 tablet (100 mg total) by mouth daily.   psyllium (METAMUCIL) 58.6 % packet Take by mouth.   [DISCONTINUED] cetirizine (ZYRTEC) 10 MG tablet Take 1 tablet by mouth daily.   [DISCONTINUED] cloNIDine (CATAPRES) 0.2 MG tablet TAKE (1) TABLET BY MOUTH TWICE DAILY.   [DISCONTINUED] NIFEdipine (PROCARDIA-XL/NIFEDICAL-XL) 30 MG 24 hr tablet TAKE (1) TABLET BY MOUTH ON TUESDAY,THURSDAY, AND SATURDAY     Allergies:   Patient has no known allergies.   ROS:   Please see the history of present illness.     All other systems reviewed and are negative.   Labs/Other Tests and Data Reviewed:    Recent Labs: No results found for requested labs within last 8760 hours.   Recent Lipid Panel Lab Results  Component Value  Date/Time   CHOL 114 08/29/2019 03:06 PM   TRIG 91 08/29/2019 03:06 PM   HDL 35 (L) 08/29/2019 03:06 PM   CHOLHDL 3.3 08/29/2019 03:06 PM   LDLCALC 62 08/29/2019 03:06 PM    Wt Readings from Last 3 Encounters:  08/04/20 209 lb 12.8 oz (95.2 kg)  07/14/20 215 lb (97.5 kg)  06/25/20 220 lb (99.8 kg)      ASSESSMENT & PLAN:    Malignant hypertension BP Readings from Last 1 Encounters:  11/10/20 (!) 143/83   Patient has h/o significantly elevated BP, now improved Continue Losartan 100 mg QD and Labetalol 300 mg BID On Hydralazine 10 mg BID Discontinued Nifedipine and Clonidine as per Nephrology Counseled for compliance with the medications Advised DASH diet and moderate exercise/walking, at least 150 mins/week   Type 2 diabetes mellitus with ESRD (end-stage renal disease) ([place]) HbA1C: <6 at last check at Nephrology clinic Has not been Toujeo now Follows up with Nephrologist for ESRD on HD On kidney [facility] list at [facility] - she was told she is not a candidate due to  calcification.  Diarrhea Chronic, related bile acid malabsorption Restarted Questran 2 gm QD for now Advised to f/u with GI   Time:   Today, I have spent 18 minutes reviewing the chart, including problem list, medications, and with the patient with telehealth technology discussing the above problems.   Medication Adjustments/Labs and Tests Ordered: Current medicines are reviewed at length with the patient today.  Concerns regarding medicines are outlined above.   Tests Ordered: No orders of the defined types were placed in this encounter.   Medication Changes: Meds ordered this encounter  Medications   cholestyramine (QUESTRAN) 4 g packet    Sig: Take 0.5 packets (2 g total) by mouth daily.    Dispense:  60 each    Refill:  1     Note: This dictation was prepared with Dragon dictation along with smaller phrase technology. Similar sounding words can be transcribed inadequately or may not be corrected upon review. Any transcriptional errors that result from this process are unintentional.      Disposition:  Follow up  Signed, Lindell Spar, MD  11/10/2020 3:29 PM     [place]

## 2020-11-10 NOTE — Assessment & Plan Note (Signed)
BP Readings from Last 1 Encounters:  11/10/20 (!) 143/83   Patient has h/o significantly elevated BP, now improved Continue Losartan 100 mg QD and Labetalol 300 mg BID On Hydralazine 10 mg BID Discontinued Nifedipine and Clonidine as per Nephrology Counseled for compliance with the medications Advised DASH diet and moderate exercise/walking, at least 150 mins/week

## 2020-11-10 NOTE — Assessment & Plan Note (Signed)
Chronic, related bile acid malabsorption Restarted Questran 2 gm QD for now Advised to f/u with GI

## 2020-11-10 NOTE — Patient Instructions (Signed)
Please start taking Questran as prescribed for diarrhea. Okay to take Imodium as needed for diarrhea.  Please follow up with GI.  Please continue taking other medications as prescribed.

## 2020-11-11 DIAGNOSIS — D509 Iron deficiency anemia, unspecified: Secondary | ICD-10-CM | POA: Diagnosis not present

## 2020-11-11 DIAGNOSIS — N186 End stage renal disease: Secondary | ICD-10-CM | POA: Diagnosis not present

## 2020-11-11 DIAGNOSIS — N2581 Secondary hyperparathyroidism of renal origin: Secondary | ICD-10-CM | POA: Diagnosis not present

## 2020-11-11 DIAGNOSIS — D631 Anemia in chronic kidney disease: Secondary | ICD-10-CM | POA: Diagnosis not present

## 2020-11-11 DIAGNOSIS — Z992 Dependence on renal dialysis: Secondary | ICD-10-CM | POA: Diagnosis not present

## 2020-11-12 ENCOUNTER — Ambulatory Visit: Payer: Medicare Other | Admitting: Podiatry

## 2020-11-13 DIAGNOSIS — D631 Anemia in chronic kidney disease: Secondary | ICD-10-CM | POA: Diagnosis not present

## 2020-11-13 DIAGNOSIS — Z992 Dependence on renal dialysis: Secondary | ICD-10-CM | POA: Diagnosis not present

## 2020-11-13 DIAGNOSIS — N2581 Secondary hyperparathyroidism of renal origin: Secondary | ICD-10-CM | POA: Diagnosis not present

## 2020-11-13 DIAGNOSIS — D509 Iron deficiency anemia, unspecified: Secondary | ICD-10-CM | POA: Diagnosis not present

## 2020-11-13 DIAGNOSIS — N186 End stage renal disease: Secondary | ICD-10-CM | POA: Diagnosis not present

## 2020-11-16 DIAGNOSIS — D509 Iron deficiency anemia, unspecified: Secondary | ICD-10-CM | POA: Diagnosis not present

## 2020-11-16 DIAGNOSIS — D631 Anemia in chronic kidney disease: Secondary | ICD-10-CM | POA: Diagnosis not present

## 2020-11-16 DIAGNOSIS — N186 End stage renal disease: Secondary | ICD-10-CM | POA: Diagnosis not present

## 2020-11-16 DIAGNOSIS — Z992 Dependence on renal dialysis: Secondary | ICD-10-CM | POA: Diagnosis not present

## 2020-11-16 DIAGNOSIS — N2581 Secondary hyperparathyroidism of renal origin: Secondary | ICD-10-CM | POA: Diagnosis not present

## 2020-11-18 DIAGNOSIS — N186 End stage renal disease: Secondary | ICD-10-CM | POA: Diagnosis not present

## 2020-11-18 DIAGNOSIS — D631 Anemia in chronic kidney disease: Secondary | ICD-10-CM | POA: Diagnosis not present

## 2020-11-18 DIAGNOSIS — Z992 Dependence on renal dialysis: Secondary | ICD-10-CM | POA: Diagnosis not present

## 2020-11-18 DIAGNOSIS — N2581 Secondary hyperparathyroidism of renal origin: Secondary | ICD-10-CM | POA: Diagnosis not present

## 2020-11-18 DIAGNOSIS — D509 Iron deficiency anemia, unspecified: Secondary | ICD-10-CM | POA: Diagnosis not present

## 2020-11-20 DIAGNOSIS — N2581 Secondary hyperparathyroidism of renal origin: Secondary | ICD-10-CM | POA: Diagnosis not present

## 2020-11-20 DIAGNOSIS — Z992 Dependence on renal dialysis: Secondary | ICD-10-CM | POA: Diagnosis not present

## 2020-11-20 DIAGNOSIS — D631 Anemia in chronic kidney disease: Secondary | ICD-10-CM | POA: Diagnosis not present

## 2020-11-20 DIAGNOSIS — D509 Iron deficiency anemia, unspecified: Secondary | ICD-10-CM | POA: Diagnosis not present

## 2020-11-20 DIAGNOSIS — N186 End stage renal disease: Secondary | ICD-10-CM | POA: Diagnosis not present

## 2020-11-23 DIAGNOSIS — D631 Anemia in chronic kidney disease: Secondary | ICD-10-CM | POA: Diagnosis not present

## 2020-11-23 DIAGNOSIS — N186 End stage renal disease: Secondary | ICD-10-CM | POA: Diagnosis not present

## 2020-11-23 DIAGNOSIS — Z992 Dependence on renal dialysis: Secondary | ICD-10-CM | POA: Diagnosis not present

## 2020-11-23 DIAGNOSIS — N2581 Secondary hyperparathyroidism of renal origin: Secondary | ICD-10-CM | POA: Diagnosis not present

## 2020-11-23 DIAGNOSIS — D509 Iron deficiency anemia, unspecified: Secondary | ICD-10-CM | POA: Diagnosis not present

## 2020-11-25 DIAGNOSIS — N2581 Secondary hyperparathyroidism of renal origin: Secondary | ICD-10-CM | POA: Diagnosis not present

## 2020-11-25 DIAGNOSIS — D509 Iron deficiency anemia, unspecified: Secondary | ICD-10-CM | POA: Diagnosis not present

## 2020-11-25 DIAGNOSIS — Z992 Dependence on renal dialysis: Secondary | ICD-10-CM | POA: Diagnosis not present

## 2020-11-25 DIAGNOSIS — N186 End stage renal disease: Secondary | ICD-10-CM | POA: Diagnosis not present

## 2020-11-25 DIAGNOSIS — D631 Anemia in chronic kidney disease: Secondary | ICD-10-CM | POA: Diagnosis not present

## 2020-11-27 DIAGNOSIS — D509 Iron deficiency anemia, unspecified: Secondary | ICD-10-CM | POA: Diagnosis not present

## 2020-11-27 DIAGNOSIS — Z992 Dependence on renal dialysis: Secondary | ICD-10-CM | POA: Diagnosis not present

## 2020-11-27 DIAGNOSIS — N2581 Secondary hyperparathyroidism of renal origin: Secondary | ICD-10-CM | POA: Diagnosis not present

## 2020-11-27 DIAGNOSIS — D631 Anemia in chronic kidney disease: Secondary | ICD-10-CM | POA: Diagnosis not present

## 2020-11-27 DIAGNOSIS — N186 End stage renal disease: Secondary | ICD-10-CM | POA: Diagnosis not present

## 2020-11-30 ENCOUNTER — Other Ambulatory Visit: Payer: Self-pay | Admitting: Internal Medicine

## 2020-11-30 DIAGNOSIS — Z992 Dependence on renal dialysis: Secondary | ICD-10-CM | POA: Diagnosis not present

## 2020-11-30 DIAGNOSIS — N2581 Secondary hyperparathyroidism of renal origin: Secondary | ICD-10-CM | POA: Diagnosis not present

## 2020-11-30 DIAGNOSIS — D631 Anemia in chronic kidney disease: Secondary | ICD-10-CM | POA: Diagnosis not present

## 2020-11-30 DIAGNOSIS — D509 Iron deficiency anemia, unspecified: Secondary | ICD-10-CM | POA: Diagnosis not present

## 2020-11-30 DIAGNOSIS — I10 Essential (primary) hypertension: Secondary | ICD-10-CM

## 2020-11-30 DIAGNOSIS — N186 End stage renal disease: Secondary | ICD-10-CM | POA: Diagnosis not present

## 2020-12-02 DIAGNOSIS — N2581 Secondary hyperparathyroidism of renal origin: Secondary | ICD-10-CM | POA: Diagnosis not present

## 2020-12-02 DIAGNOSIS — D631 Anemia in chronic kidney disease: Secondary | ICD-10-CM | POA: Diagnosis not present

## 2020-12-02 DIAGNOSIS — N186 End stage renal disease: Secondary | ICD-10-CM | POA: Diagnosis not present

## 2020-12-02 DIAGNOSIS — Z992 Dependence on renal dialysis: Secondary | ICD-10-CM | POA: Diagnosis not present

## 2020-12-02 DIAGNOSIS — D509 Iron deficiency anemia, unspecified: Secondary | ICD-10-CM | POA: Diagnosis not present

## 2020-12-04 DIAGNOSIS — Z992 Dependence on renal dialysis: Secondary | ICD-10-CM | POA: Diagnosis not present

## 2020-12-04 DIAGNOSIS — D631 Anemia in chronic kidney disease: Secondary | ICD-10-CM | POA: Diagnosis not present

## 2020-12-04 DIAGNOSIS — D509 Iron deficiency anemia, unspecified: Secondary | ICD-10-CM | POA: Diagnosis not present

## 2020-12-04 DIAGNOSIS — N2581 Secondary hyperparathyroidism of renal origin: Secondary | ICD-10-CM | POA: Diagnosis not present

## 2020-12-04 DIAGNOSIS — N186 End stage renal disease: Secondary | ICD-10-CM | POA: Diagnosis not present

## 2020-12-09 DIAGNOSIS — N2581 Secondary hyperparathyroidism of renal origin: Secondary | ICD-10-CM | POA: Diagnosis not present

## 2020-12-09 DIAGNOSIS — Z992 Dependence on renal dialysis: Secondary | ICD-10-CM | POA: Diagnosis not present

## 2020-12-09 DIAGNOSIS — D509 Iron deficiency anemia, unspecified: Secondary | ICD-10-CM | POA: Diagnosis not present

## 2020-12-09 DIAGNOSIS — D631 Anemia in chronic kidney disease: Secondary | ICD-10-CM | POA: Diagnosis not present

## 2020-12-09 DIAGNOSIS — N186 End stage renal disease: Secondary | ICD-10-CM | POA: Diagnosis not present

## 2020-12-11 DIAGNOSIS — D631 Anemia in chronic kidney disease: Secondary | ICD-10-CM | POA: Diagnosis not present

## 2020-12-11 DIAGNOSIS — N186 End stage renal disease: Secondary | ICD-10-CM | POA: Diagnosis not present

## 2020-12-11 DIAGNOSIS — Z992 Dependence on renal dialysis: Secondary | ICD-10-CM | POA: Diagnosis not present

## 2020-12-11 DIAGNOSIS — N2581 Secondary hyperparathyroidism of renal origin: Secondary | ICD-10-CM | POA: Diagnosis not present

## 2020-12-11 DIAGNOSIS — D509 Iron deficiency anemia, unspecified: Secondary | ICD-10-CM | POA: Diagnosis not present

## 2020-12-14 DIAGNOSIS — N186 End stage renal disease: Secondary | ICD-10-CM | POA: Diagnosis not present

## 2020-12-14 DIAGNOSIS — Z992 Dependence on renal dialysis: Secondary | ICD-10-CM | POA: Diagnosis not present

## 2020-12-14 DIAGNOSIS — D631 Anemia in chronic kidney disease: Secondary | ICD-10-CM | POA: Diagnosis not present

## 2020-12-14 DIAGNOSIS — D509 Iron deficiency anemia, unspecified: Secondary | ICD-10-CM | POA: Diagnosis not present

## 2020-12-14 DIAGNOSIS — N2581 Secondary hyperparathyroidism of renal origin: Secondary | ICD-10-CM | POA: Diagnosis not present

## 2020-12-15 ENCOUNTER — Ambulatory Visit: Payer: Medicare Other | Admitting: Internal Medicine

## 2020-12-15 NOTE — Progress Notes (Deleted)
Janet Mitchell, female    DOB: [birth year]       MRN: [id number]   Brief patient profile:  67 yobf never smoker with seasonal allergies worse spring worse since childhood cough/ sneeze rx with otc covid 28 [employer] Jan 2021 sent home on 02 but never used it and f/u cxr's suggested PF so referred to pulmonary clinic in [place]  11/21/2019 by Dr    Doristine Bosworth    Admit date: 04/03/2019 Discharge date: 04/13/2019   Admitted From: Home Disposition:  Home   Recommendations for Outpatient Follow-up:  Follow up with PCP in 1-2 weeks Please obtain BMP/CBC in one week Please follow up with nephrology as scheduled Please follow up with gastroenterology   Home Health: Yes Equipment/Devices: Oxygen, wheelchair, PT, OT    Discharge Condition: Stable  CODE STATUS: Full Diet recommendation: Renal    Brief/Interim Summary:  55 y.o. female with medical history of anemia due to ESRD on peritoneal dialysis, history of blood transfusion, CHF, chronic cholecystitis s/p cholecystectomy, ESRD on PD, history of headaches, history of hypertension, spinal headaches, type 2 diabetes mellitus who presented to the ED with nausea, vomiting and diarrhea x approximately one month.  Loose stools noted since her cholecystectomy.  She was seen by GI outpatient, prescribed Levaquin, not started before this admission. Was taking Imodium at home without much effect.  In the ED, afebrile, hypoxic 88% on room air, COVID-19 antigen negative but PCR POSITIVE.  Hyponatremic 127.  Severe hypoalbuminemia 1.9, creatinine 9.53 consistent with ESRD. Inflammatory markers were elevated.  Procalcitonin 1.70.  Chest xray showed diffuse bilateral airspace disease.  She was treated with IV fluids, potassium, Rocephin and azithromycin.  Patient admitted to hospitalist service, started on remdesivir, steroids, bronchodilators.  Nephrology consulted for PD management.  GI consulted for persistent abdominal pain and diarrhea.  Patient has chronic  diarrhea likely bile salt associated s/p cholecystectomy.  Started on cholestyramine, Levsin, rifaximin, trial of amitriptyline at bedtime, Protonix.  H pylori and celiac panels negative.  CT abdomen and pelvis without contrast during this admission revealed heavily calcified aorta and branch vessels.  Consider mesenteric ischemia as source of patient's most often post-prandial abdominal pain.  Discussed with renal and radiology and planned for CTA abdomen/pelvis to complete evaluation.  Patient's abdominal pain had improved, tolerating some oral intake.  She requested discharge home and close outpatient follow up and CTA. Patient qualified for oxygen on discharge, this was arranged in addition to home health PT and OT.       Discharge Diagnoses: Principal Problem:   ESRD on peritoneal dialysis [name]:   Essential hypertension   Type 2 diabetes mellitus with ESRD (end-stage renal disease) (HCC)   Chronic diarrhea   Black stool   Uremia, acute   Weakness   Vertigo   Acute blood loss anemia   Hyperkalemia   Hypotension      History of Present Illness  11/21/2019  Pulmonary/ 1st office eval/ Melvyn Novas / [facility]  ? ILD Chief Complaint  Patient presents with   Pulmonary Consult    Referred by Dr Early Osmond for eval of ILD. She c/o SOB since she had covid Jan 2021- gets winded walking approx 50 ft.   Dyspnea: since covid 50 ft doe / does not check sats Cough: non prod  p lie down for about an hour every other night but does not keep her up  Sleep: bed is flat/ 3 pillows  SABA use: not using  02 has  it not using  Rec Make sure you check your oxygen saturations at highest level of activity to be sure it stays over 90%  Please remember to go to the  x-ray department  @  [facility]  After dialysis tomorrow as dry as they can get you  . NOT DONE    Please schedule a follow up visit in 3 months -PFTs on return -NOT DONE    12/15/2020  f/u ov/[place]  office/Mysti Haley re: doe p covid No chief complaint on file.   Dyspnea:  *** Cough: *** Sleeping: *** SABA use: *** 02: *** Covid status: *** Lung cancer screening: ***   No obvious day to day or daytime variability or assoc excess/ purulent sputum or mucus plugs or hemoptysis or cp or chest tightness, subjective wheeze or overt sinus or hb symptoms.   *** without nocturnal  or early am exacerbation  of respiratory  c/o's or need for noct saba. Also denies any obvious fluctuation of symptoms with weather or environmental changes or other aggravating or alleviating factors except as outlined above   No unusual exposure hx or h/o childhood pna/ asthma or knowledge of premature birth.  Current Allergies, Complete Past Medical History, Past Surgical History, Family History, and Social History were reviewed in [employer] record.  ROS  The following are not active complaints unless bolded Hoarseness, sore throat, dysphagia, dental problems, itching, sneezing,  nasal congestion or discharge of excess mucus or purulent secretions, ear ache,   fever, chills, sweats, unintended wt loss or wt gain, classically pleuritic or exertional cp,  orthopnea pnd or arm/hand swelling  or leg swelling, presyncope, palpitations, abdominal pain, anorexia, nausea, vomiting, diarrhea  or change in bowel habits or change in bladder habits, change in stools or change in urine, dysuria, hematuria,  rash, arthralgias, visual complaints, headache, numbness, weakness or ataxia or problems with walking or coordination,  change in mood or  memory.        No outpatient medications have been marked as taking for the 12/15/20 encounter (Appointment) with Tanda Rockers, MD.             Past Medical History:  Diagnosis Date   Anemia    Arthritis    CHF (congestive heart failure) ([place])    pt states she has been cleared of heart failure/disease   Chronic cholecystitis with calculus    COVID-19 virus  infection 03/2019   ESRD (end stage renal disease) on dialysis [facility])    Dialysis M-W-F   Headache    "a few/wk" (03/09/2018) - no longer having these   History of blood transfusion 10/2017   "low blood count" (03/09/2018)   Hypertension    Seasonal allergies    Spinal headache    Type II diabetes mellitus ([place])         Objective:      Wt Readings from Last 3 Encounters:  08/04/20 209 lb 12.8 oz (95.2 kg)  07/14/20 215 lb (97.5 kg)  06/25/20 220 lb (99.8 kg)      Vital signs reviewed  12/15/2020  - Note at rest 02 sats  ***% on ***   General appearance:    ***     CXR PA and Lateral:  11/22/2019 to be done p HD   I personally reviewed images and agree with radiology impression as follows:   Not done         Assessment

## 2020-12-16 DIAGNOSIS — N186 End stage renal disease: Secondary | ICD-10-CM | POA: Diagnosis not present

## 2020-12-16 DIAGNOSIS — D631 Anemia in chronic kidney disease: Secondary | ICD-10-CM | POA: Diagnosis not present

## 2020-12-16 DIAGNOSIS — Z992 Dependence on renal dialysis: Secondary | ICD-10-CM | POA: Diagnosis not present

## 2020-12-16 DIAGNOSIS — D509 Iron deficiency anemia, unspecified: Secondary | ICD-10-CM | POA: Diagnosis not present

## 2020-12-16 DIAGNOSIS — N2581 Secondary hyperparathyroidism of renal origin: Secondary | ICD-10-CM | POA: Diagnosis not present

## 2020-12-18 DIAGNOSIS — D631 Anemia in chronic kidney disease: Secondary | ICD-10-CM | POA: Diagnosis not present

## 2020-12-18 DIAGNOSIS — D509 Iron deficiency anemia, unspecified: Secondary | ICD-10-CM | POA: Diagnosis not present

## 2020-12-18 DIAGNOSIS — N2581 Secondary hyperparathyroidism of renal origin: Secondary | ICD-10-CM | POA: Diagnosis not present

## 2020-12-18 DIAGNOSIS — Z992 Dependence on renal dialysis: Secondary | ICD-10-CM | POA: Diagnosis not present

## 2020-12-18 DIAGNOSIS — N186 End stage renal disease: Secondary | ICD-10-CM | POA: Diagnosis not present

## 2020-12-21 DIAGNOSIS — N2581 Secondary hyperparathyroidism of renal origin: Secondary | ICD-10-CM | POA: Diagnosis not present

## 2020-12-21 DIAGNOSIS — D631 Anemia in chronic kidney disease: Secondary | ICD-10-CM | POA: Diagnosis not present

## 2020-12-21 DIAGNOSIS — N186 End stage renal disease: Secondary | ICD-10-CM | POA: Diagnosis not present

## 2020-12-21 DIAGNOSIS — Z992 Dependence on renal dialysis: Secondary | ICD-10-CM | POA: Diagnosis not present

## 2020-12-21 DIAGNOSIS — D509 Iron deficiency anemia, unspecified: Secondary | ICD-10-CM | POA: Diagnosis not present

## 2020-12-23 DIAGNOSIS — Z992 Dependence on renal dialysis: Secondary | ICD-10-CM | POA: Diagnosis not present

## 2020-12-23 DIAGNOSIS — D631 Anemia in chronic kidney disease: Secondary | ICD-10-CM | POA: Diagnosis not present

## 2020-12-23 DIAGNOSIS — N186 End stage renal disease: Secondary | ICD-10-CM | POA: Diagnosis not present

## 2020-12-23 DIAGNOSIS — N2581 Secondary hyperparathyroidism of renal origin: Secondary | ICD-10-CM | POA: Diagnosis not present

## 2020-12-23 DIAGNOSIS — D509 Iron deficiency anemia, unspecified: Secondary | ICD-10-CM | POA: Diagnosis not present

## 2020-12-24 ENCOUNTER — Ambulatory Visit: Payer: Medicare Other | Admitting: Podiatry

## 2020-12-25 DIAGNOSIS — Z992 Dependence on renal dialysis: Secondary | ICD-10-CM | POA: Diagnosis not present

## 2020-12-25 DIAGNOSIS — D509 Iron deficiency anemia, unspecified: Secondary | ICD-10-CM | POA: Diagnosis not present

## 2020-12-25 DIAGNOSIS — N2581 Secondary hyperparathyroidism of renal origin: Secondary | ICD-10-CM | POA: Diagnosis not present

## 2020-12-25 DIAGNOSIS — D631 Anemia in chronic kidney disease: Secondary | ICD-10-CM | POA: Diagnosis not present

## 2020-12-25 DIAGNOSIS — N186 End stage renal disease: Secondary | ICD-10-CM | POA: Diagnosis not present

## 2020-12-28 DIAGNOSIS — N2581 Secondary hyperparathyroidism of renal origin: Secondary | ICD-10-CM | POA: Diagnosis not present

## 2020-12-28 DIAGNOSIS — Z992 Dependence on renal dialysis: Secondary | ICD-10-CM | POA: Diagnosis not present

## 2020-12-28 DIAGNOSIS — N186 End stage renal disease: Secondary | ICD-10-CM | POA: Diagnosis not present

## 2020-12-28 DIAGNOSIS — D631 Anemia in chronic kidney disease: Secondary | ICD-10-CM | POA: Diagnosis not present

## 2020-12-28 DIAGNOSIS — Z23 Encounter for immunization: Secondary | ICD-10-CM | POA: Diagnosis not present

## 2020-12-28 DIAGNOSIS — D509 Iron deficiency anemia, unspecified: Secondary | ICD-10-CM | POA: Diagnosis not present

## 2020-12-30 DIAGNOSIS — N2581 Secondary hyperparathyroidism of renal origin: Secondary | ICD-10-CM | POA: Diagnosis not present

## 2020-12-30 DIAGNOSIS — D509 Iron deficiency anemia, unspecified: Secondary | ICD-10-CM | POA: Diagnosis not present

## 2020-12-30 DIAGNOSIS — Z23 Encounter for immunization: Secondary | ICD-10-CM | POA: Diagnosis not present

## 2020-12-30 DIAGNOSIS — D631 Anemia in chronic kidney disease: Secondary | ICD-10-CM | POA: Diagnosis not present

## 2020-12-30 DIAGNOSIS — Z992 Dependence on renal dialysis: Secondary | ICD-10-CM | POA: Diagnosis not present

## 2020-12-30 DIAGNOSIS — N186 End stage renal disease: Secondary | ICD-10-CM | POA: Diagnosis not present

## 2021-01-01 DIAGNOSIS — N2581 Secondary hyperparathyroidism of renal origin: Secondary | ICD-10-CM | POA: Diagnosis not present

## 2021-01-01 DIAGNOSIS — Z23 Encounter for immunization: Secondary | ICD-10-CM | POA: Diagnosis not present

## 2021-01-01 DIAGNOSIS — N186 End stage renal disease: Secondary | ICD-10-CM | POA: Diagnosis not present

## 2021-01-01 DIAGNOSIS — D509 Iron deficiency anemia, unspecified: Secondary | ICD-10-CM | POA: Diagnosis not present

## 2021-01-01 DIAGNOSIS — Z992 Dependence on renal dialysis: Secondary | ICD-10-CM | POA: Diagnosis not present

## 2021-01-01 DIAGNOSIS — D631 Anemia in chronic kidney disease: Secondary | ICD-10-CM | POA: Diagnosis not present

## 2021-01-04 DIAGNOSIS — N186 End stage renal disease: Secondary | ICD-10-CM | POA: Diagnosis not present

## 2021-01-04 DIAGNOSIS — Z992 Dependence on renal dialysis: Secondary | ICD-10-CM | POA: Diagnosis not present

## 2021-01-04 DIAGNOSIS — Z794 Long term (current) use of insulin: Secondary | ICD-10-CM | POA: Diagnosis not present

## 2021-01-04 DIAGNOSIS — D509 Iron deficiency anemia, unspecified: Secondary | ICD-10-CM | POA: Diagnosis not present

## 2021-01-04 DIAGNOSIS — Z23 Encounter for immunization: Secondary | ICD-10-CM | POA: Diagnosis not present

## 2021-01-04 DIAGNOSIS — E119 Type 2 diabetes mellitus without complications: Secondary | ICD-10-CM | POA: Diagnosis not present

## 2021-01-04 DIAGNOSIS — N2581 Secondary hyperparathyroidism of renal origin: Secondary | ICD-10-CM | POA: Diagnosis not present

## 2021-01-04 DIAGNOSIS — D631 Anemia in chronic kidney disease: Secondary | ICD-10-CM | POA: Diagnosis not present

## 2021-01-06 DIAGNOSIS — D509 Iron deficiency anemia, unspecified: Secondary | ICD-10-CM | POA: Diagnosis not present

## 2021-01-06 DIAGNOSIS — Z23 Encounter for immunization: Secondary | ICD-10-CM | POA: Diagnosis not present

## 2021-01-06 DIAGNOSIS — Z992 Dependence on renal dialysis: Secondary | ICD-10-CM | POA: Diagnosis not present

## 2021-01-06 DIAGNOSIS — N2581 Secondary hyperparathyroidism of renal origin: Secondary | ICD-10-CM | POA: Diagnosis not present

## 2021-01-06 DIAGNOSIS — N186 End stage renal disease: Secondary | ICD-10-CM | POA: Diagnosis not present

## 2021-01-06 DIAGNOSIS — D631 Anemia in chronic kidney disease: Secondary | ICD-10-CM | POA: Diagnosis not present

## 2021-01-08 DIAGNOSIS — Z23 Encounter for immunization: Secondary | ICD-10-CM | POA: Diagnosis not present

## 2021-01-08 DIAGNOSIS — N186 End stage renal disease: Secondary | ICD-10-CM | POA: Diagnosis not present

## 2021-01-08 DIAGNOSIS — D631 Anemia in chronic kidney disease: Secondary | ICD-10-CM | POA: Diagnosis not present

## 2021-01-08 DIAGNOSIS — Z992 Dependence on renal dialysis: Secondary | ICD-10-CM | POA: Diagnosis not present

## 2021-01-08 DIAGNOSIS — D509 Iron deficiency anemia, unspecified: Secondary | ICD-10-CM | POA: Diagnosis not present

## 2021-01-08 DIAGNOSIS — N2581 Secondary hyperparathyroidism of renal origin: Secondary | ICD-10-CM | POA: Diagnosis not present

## 2021-01-11 DIAGNOSIS — Z992 Dependence on renal dialysis: Secondary | ICD-10-CM | POA: Diagnosis not present

## 2021-01-11 DIAGNOSIS — N2581 Secondary hyperparathyroidism of renal origin: Secondary | ICD-10-CM | POA: Diagnosis not present

## 2021-01-11 DIAGNOSIS — Z23 Encounter for immunization: Secondary | ICD-10-CM | POA: Diagnosis not present

## 2021-01-11 DIAGNOSIS — D631 Anemia in chronic kidney disease: Secondary | ICD-10-CM | POA: Diagnosis not present

## 2021-01-11 DIAGNOSIS — N186 End stage renal disease: Secondary | ICD-10-CM | POA: Diagnosis not present

## 2021-01-11 DIAGNOSIS — D509 Iron deficiency anemia, unspecified: Secondary | ICD-10-CM | POA: Diagnosis not present

## 2021-01-12 ENCOUNTER — Ambulatory Visit: Payer: Medicare Other | Admitting: Podiatry

## 2021-01-13 DIAGNOSIS — N186 End stage renal disease: Secondary | ICD-10-CM | POA: Diagnosis not present

## 2021-01-13 DIAGNOSIS — D631 Anemia in chronic kidney disease: Secondary | ICD-10-CM | POA: Diagnosis not present

## 2021-01-13 DIAGNOSIS — Z23 Encounter for immunization: Secondary | ICD-10-CM | POA: Diagnosis not present

## 2021-01-13 DIAGNOSIS — N2581 Secondary hyperparathyroidism of renal origin: Secondary | ICD-10-CM | POA: Diagnosis not present

## 2021-01-13 DIAGNOSIS — Z992 Dependence on renal dialysis: Secondary | ICD-10-CM | POA: Diagnosis not present

## 2021-01-13 DIAGNOSIS — D509 Iron deficiency anemia, unspecified: Secondary | ICD-10-CM | POA: Diagnosis not present

## 2021-01-15 ENCOUNTER — Other Ambulatory Visit: Payer: Self-pay | Admitting: Internal Medicine

## 2021-01-15 ENCOUNTER — Other Ambulatory Visit: Payer: Self-pay | Admitting: Family Medicine

## 2021-01-15 DIAGNOSIS — D631 Anemia in chronic kidney disease: Secondary | ICD-10-CM | POA: Diagnosis not present

## 2021-01-15 DIAGNOSIS — N186 End stage renal disease: Secondary | ICD-10-CM | POA: Diagnosis not present

## 2021-01-15 DIAGNOSIS — Z23 Encounter for immunization: Secondary | ICD-10-CM | POA: Diagnosis not present

## 2021-01-15 DIAGNOSIS — D509 Iron deficiency anemia, unspecified: Secondary | ICD-10-CM | POA: Diagnosis not present

## 2021-01-15 DIAGNOSIS — Z992 Dependence on renal dialysis: Secondary | ICD-10-CM | POA: Diagnosis not present

## 2021-01-15 DIAGNOSIS — N2581 Secondary hyperparathyroidism of renal origin: Secondary | ICD-10-CM | POA: Diagnosis not present

## 2021-01-18 DIAGNOSIS — D509 Iron deficiency anemia, unspecified: Secondary | ICD-10-CM | POA: Diagnosis not present

## 2021-01-18 DIAGNOSIS — N2581 Secondary hyperparathyroidism of renal origin: Secondary | ICD-10-CM | POA: Diagnosis not present

## 2021-01-18 DIAGNOSIS — Z992 Dependence on renal dialysis: Secondary | ICD-10-CM | POA: Diagnosis not present

## 2021-01-18 DIAGNOSIS — D631 Anemia in chronic kidney disease: Secondary | ICD-10-CM | POA: Diagnosis not present

## 2021-01-18 DIAGNOSIS — N186 End stage renal disease: Secondary | ICD-10-CM | POA: Diagnosis not present

## 2021-01-18 DIAGNOSIS — Z23 Encounter for immunization: Secondary | ICD-10-CM | POA: Diagnosis not present

## 2021-01-19 ENCOUNTER — Other Ambulatory Visit: Payer: Self-pay

## 2021-01-19 ENCOUNTER — Ambulatory Visit (INDEPENDENT_AMBULATORY_CARE_PROVIDER_SITE_OTHER): Payer: Medicare Other | Admitting: Podiatry

## 2021-01-19 ENCOUNTER — Encounter: Payer: Self-pay | Admitting: Podiatry

## 2021-01-19 DIAGNOSIS — L97522 Non-pressure chronic ulcer of other part of left foot with fat layer exposed: Secondary | ICD-10-CM

## 2021-01-19 DIAGNOSIS — L03116 Cellulitis of left lower limb: Secondary | ICD-10-CM

## 2021-01-19 DIAGNOSIS — E08621 Diabetes mellitus due to underlying condition with foot ulcer: Secondary | ICD-10-CM | POA: Diagnosis not present

## 2021-01-19 MED ORDER — DOXYCYCLINE HYCLATE 100 MG PO TABS: 100.0000 mg | ORAL_TABLET | Freq: Two times a day (BID) | ORAL | 0 refills | Status: AC

## 2021-01-19 MED ORDER — MUPIROCIN 2 % EX OINT: 1.0000 "application " | TOPICAL_OINTMENT | Freq: Every day | CUTANEOUS | 0 refills | Status: AC

## 2021-01-20 DIAGNOSIS — N186 End stage renal disease: Secondary | ICD-10-CM | POA: Diagnosis not present

## 2021-01-20 DIAGNOSIS — D631 Anemia in chronic kidney disease: Secondary | ICD-10-CM | POA: Diagnosis not present

## 2021-01-20 DIAGNOSIS — D509 Iron deficiency anemia, unspecified: Secondary | ICD-10-CM | POA: Diagnosis not present

## 2021-01-20 DIAGNOSIS — Z23 Encounter for immunization: Secondary | ICD-10-CM | POA: Diagnosis not present

## 2021-01-20 DIAGNOSIS — N2581 Secondary hyperparathyroidism of renal origin: Secondary | ICD-10-CM | POA: Diagnosis not present

## 2021-01-20 DIAGNOSIS — Z992 Dependence on renal dialysis: Secondary | ICD-10-CM | POA: Diagnosis not present

## 2021-01-22 DIAGNOSIS — N186 End stage renal disease: Secondary | ICD-10-CM | POA: Diagnosis not present

## 2021-01-22 DIAGNOSIS — D631 Anemia in chronic kidney disease: Secondary | ICD-10-CM | POA: Diagnosis not present

## 2021-01-22 DIAGNOSIS — Z992 Dependence on renal dialysis: Secondary | ICD-10-CM | POA: Diagnosis not present

## 2021-01-22 DIAGNOSIS — Z23 Encounter for immunization: Secondary | ICD-10-CM | POA: Diagnosis not present

## 2021-01-22 DIAGNOSIS — N2581 Secondary hyperparathyroidism of renal origin: Secondary | ICD-10-CM | POA: Diagnosis not present

## 2021-01-22 DIAGNOSIS — D509 Iron deficiency anemia, unspecified: Secondary | ICD-10-CM | POA: Diagnosis not present

## 2021-01-25 DIAGNOSIS — D509 Iron deficiency anemia, unspecified: Secondary | ICD-10-CM | POA: Diagnosis not present

## 2021-01-25 DIAGNOSIS — N2581 Secondary hyperparathyroidism of renal origin: Secondary | ICD-10-CM | POA: Diagnosis not present

## 2021-01-25 DIAGNOSIS — N186 End stage renal disease: Secondary | ICD-10-CM | POA: Diagnosis not present

## 2021-01-25 DIAGNOSIS — D631 Anemia in chronic kidney disease: Secondary | ICD-10-CM | POA: Diagnosis not present

## 2021-01-25 DIAGNOSIS — Z992 Dependence on renal dialysis: Secondary | ICD-10-CM | POA: Diagnosis not present

## 2021-01-25 DIAGNOSIS — Z23 Encounter for immunization: Secondary | ICD-10-CM | POA: Diagnosis not present

## 2021-01-25 NOTE — Progress Notes (Signed)
  Subjective:  Patient ID: Janet Mitchell, female    DOB: 1966/02/17,  MRN: [id number]  Chief Complaint  Patient presents with   Foot Ulcer    left foot ulcer-soreness    55 y.o. female presents with the above complaint. History confirmed with patient.  She has a new ulcer that has developed, this is a recurrent issue from prior ulcerations on this area  Objective:  Physical Exam: warm, good capillary refill, no trophic changes or ulcerative lesions, normal DP and PT pulses, and reduced sensation to monofilament. Left Foot:  Prior hallux amputation there is a full-thickness ulceration measuring 1.0 x 1.4 x 0.8 cm probes to subcutaneous tissue no exposed bone tendon or joint no purulence or malodor, there is serous drainage and cellulitis     Assessment:   1. Diabetic ulcer of left foot associated with diabetes mellitus due to underlying condition, with fat layer exposed, unspecified part of foot ([place])   2. Cellulitis of left foot      Plan:  Patient was evaluated and treated and all questions answered.  Ulcer left foot -We discussed the etiology and factors that are a part of the wound healing process.  We also discussed the risk of infection both soft tissue and osteomyelitis from open ulceration.  Discussed the risk of limb loss if this happens or worsens. -Debridement as below. -Dressed with Iodosorb, DSD. -Peg assist and surgical shoe dispensed -Take x-rays next visit -Lab work ordered for infectious work-up as well as Rx for doxycycline and mupirocin which she will use to change daily at home -Continue off-loading with surgical shoe.  Procedure: Excisional Debridement of Wound Rationale: Removal of non-viable soft tissue from the wound to promote healing.  Anesthesia: none Post-Debridement Wound Measurements: 1.4 cm x 1.0 cm x 0.8 cm  Type of Debridement: Sharp Excisional Tissue Removed: Non-viable soft tissue Depth of Debridement: subcutaneous tissue. Technique:  Sharp excisional debridement to bleeding, viable wound base.  Dressing: Dry, sterile, compression dressing. Disposition: Patient tolerated procedure well.   Return in about 2 weeks (around 02/02/2021) for wound care.

## 2021-01-27 DIAGNOSIS — N186 End stage renal disease: Secondary | ICD-10-CM | POA: Diagnosis not present

## 2021-01-27 DIAGNOSIS — D509 Iron deficiency anemia, unspecified: Secondary | ICD-10-CM | POA: Diagnosis not present

## 2021-01-27 DIAGNOSIS — Z992 Dependence on renal dialysis: Secondary | ICD-10-CM | POA: Diagnosis not present

## 2021-01-27 DIAGNOSIS — N2581 Secondary hyperparathyroidism of renal origin: Secondary | ICD-10-CM | POA: Diagnosis not present

## 2021-01-27 DIAGNOSIS — D631 Anemia in chronic kidney disease: Secondary | ICD-10-CM | POA: Diagnosis not present

## 2021-01-28 ENCOUNTER — Other Ambulatory Visit: Payer: Self-pay | Admitting: Internal Medicine

## 2021-01-28 DIAGNOSIS — I10 Essential (primary) hypertension: Secondary | ICD-10-CM

## 2021-01-29 DIAGNOSIS — D631 Anemia in chronic kidney disease: Secondary | ICD-10-CM | POA: Diagnosis not present

## 2021-01-29 DIAGNOSIS — N2581 Secondary hyperparathyroidism of renal origin: Secondary | ICD-10-CM | POA: Diagnosis not present

## 2021-01-29 DIAGNOSIS — N186 End stage renal disease: Secondary | ICD-10-CM | POA: Diagnosis not present

## 2021-01-29 DIAGNOSIS — Z992 Dependence on renal dialysis: Secondary | ICD-10-CM | POA: Diagnosis not present

## 2021-01-29 DIAGNOSIS — D509 Iron deficiency anemia, unspecified: Secondary | ICD-10-CM | POA: Diagnosis not present

## 2021-02-01 DIAGNOSIS — D631 Anemia in chronic kidney disease: Secondary | ICD-10-CM | POA: Diagnosis not present

## 2021-02-01 DIAGNOSIS — N186 End stage renal disease: Secondary | ICD-10-CM | POA: Diagnosis not present

## 2021-02-01 DIAGNOSIS — D509 Iron deficiency anemia, unspecified: Secondary | ICD-10-CM | POA: Diagnosis not present

## 2021-02-01 DIAGNOSIS — Z992 Dependence on renal dialysis: Secondary | ICD-10-CM | POA: Diagnosis not present

## 2021-02-01 DIAGNOSIS — N2581 Secondary hyperparathyroidism of renal origin: Secondary | ICD-10-CM | POA: Diagnosis not present

## 2021-02-02 ENCOUNTER — Ambulatory Visit: Payer: Medicare Other | Admitting: Podiatry

## 2021-02-03 DIAGNOSIS — N2581 Secondary hyperparathyroidism of renal origin: Secondary | ICD-10-CM | POA: Diagnosis not present

## 2021-02-03 DIAGNOSIS — Z992 Dependence on renal dialysis: Secondary | ICD-10-CM | POA: Diagnosis not present

## 2021-02-03 DIAGNOSIS — N186 End stage renal disease: Secondary | ICD-10-CM | POA: Diagnosis not present

## 2021-02-03 DIAGNOSIS — D509 Iron deficiency anemia, unspecified: Secondary | ICD-10-CM | POA: Diagnosis not present

## 2021-02-03 DIAGNOSIS — D631 Anemia in chronic kidney disease: Secondary | ICD-10-CM | POA: Diagnosis not present

## 2021-02-05 DIAGNOSIS — D509 Iron deficiency anemia, unspecified: Secondary | ICD-10-CM | POA: Diagnosis not present

## 2021-02-05 DIAGNOSIS — N186 End stage renal disease: Secondary | ICD-10-CM | POA: Diagnosis not present

## 2021-02-05 DIAGNOSIS — Z992 Dependence on renal dialysis: Secondary | ICD-10-CM | POA: Diagnosis not present

## 2021-02-05 DIAGNOSIS — N2581 Secondary hyperparathyroidism of renal origin: Secondary | ICD-10-CM | POA: Diagnosis not present

## 2021-02-05 DIAGNOSIS — D631 Anemia in chronic kidney disease: Secondary | ICD-10-CM | POA: Diagnosis not present

## 2021-02-08 ENCOUNTER — Telehealth: Payer: Self-pay | Admitting: Internal Medicine

## 2021-02-08 DIAGNOSIS — D509 Iron deficiency anemia, unspecified: Secondary | ICD-10-CM | POA: Diagnosis not present

## 2021-02-08 DIAGNOSIS — N186 End stage renal disease: Secondary | ICD-10-CM | POA: Diagnosis not present

## 2021-02-08 DIAGNOSIS — Z992 Dependence on renal dialysis: Secondary | ICD-10-CM | POA: Diagnosis not present

## 2021-02-08 DIAGNOSIS — N2581 Secondary hyperparathyroidism of renal origin: Secondary | ICD-10-CM | POA: Diagnosis not present

## 2021-02-08 DIAGNOSIS — D631 Anemia in chronic kidney disease: Secondary | ICD-10-CM | POA: Diagnosis not present

## 2021-02-08 NOTE — Telephone Encounter (Signed)
Sharyn Lull with [employer] called in on patient behalf of pt about incontinence supplies request sheet .   Vicenta Dunning to resend request  [employer] w/ [employer]  [phone]

## 2021-02-08 NOTE — Telephone Encounter (Signed)
Will await request from [employer]

## 2021-02-09 ENCOUNTER — Other Ambulatory Visit: Payer: Self-pay

## 2021-02-09 ENCOUNTER — Ambulatory Visit (INDEPENDENT_AMBULATORY_CARE_PROVIDER_SITE_OTHER): Payer: Medicare Other | Admitting: Podiatry

## 2021-02-09 ENCOUNTER — Ambulatory Visit (INDEPENDENT_AMBULATORY_CARE_PROVIDER_SITE_OTHER): Payer: Medicare Other

## 2021-02-09 DIAGNOSIS — L97522 Non-pressure chronic ulcer of other part of left foot with fat layer exposed: Secondary | ICD-10-CM

## 2021-02-09 DIAGNOSIS — E08621 Diabetes mellitus due to underlying condition with foot ulcer: Secondary | ICD-10-CM | POA: Diagnosis not present

## 2021-02-10 DIAGNOSIS — D631 Anemia in chronic kidney disease: Secondary | ICD-10-CM | POA: Diagnosis not present

## 2021-02-10 DIAGNOSIS — D509 Iron deficiency anemia, unspecified: Secondary | ICD-10-CM | POA: Diagnosis not present

## 2021-02-10 DIAGNOSIS — Z992 Dependence on renal dialysis: Secondary | ICD-10-CM | POA: Diagnosis not present

## 2021-02-10 DIAGNOSIS — N186 End stage renal disease: Secondary | ICD-10-CM | POA: Diagnosis not present

## 2021-02-10 DIAGNOSIS — N2581 Secondary hyperparathyroidism of renal origin: Secondary | ICD-10-CM | POA: Diagnosis not present

## 2021-02-10 NOTE — Progress Notes (Signed)
  Subjective:  Patient ID: Janet Mitchell, female    DOB: 1966-01-11,  MRN: [id number]  Chief Complaint  Patient presents with   Diabetic Ulcer    2 week follow up left foot ulcer-soreness    55 y.o. female presents with the above complaint. History confirmed with patient.  Has not completed her lab work yet   Objective:  Physical Exam: warm, good capillary refill, no trophic changes or ulcerative lesions, normal DP and PT pulses, and reduced sensation to monofilament. Left Foot:  Prior hallux amputation there is a full-thickness ulceration measuring 0.9 x 1.2 x 0.6 cm probes to subcutaneous tissue no exposed bone tendon or joint no purulence or malodor, no cellulitis    Multiple view left foot radiographs taken today show prior partial first ray amputation, there is some heterotopic ossification, nothing significantly prominent Assessment:   1. Diabetic ulcer of left foot associated with diabetes mellitus due to underlying condition, with fat layer exposed, unspecified part of foot ([place])      Plan:  Patient was evaluated and treated and all questions answered.  Ulcer left foot -We discussed the etiology and factors that are a part of the wound healing process.  We also discussed the risk of infection both soft tissue and osteomyelitis from open ulceration.  Discussed the risk of limb loss if this happens or worsens. -Debridement as below. -Dressed with Iodosorb, DSD. -Continue using Peg assist and surgical shoe  -Encouraged her to complete her lab work  Procedure: Excisional Debridement of Wound Rationale: Removal of non-viable soft tissue from the wound to promote healing.  Anesthesia: none Post-Debridement Wound Measurements: 0.9 x 1.2 x 0.6 Type of Debridement: Sharp Excisional Tissue Removed: Non-viable soft tissue Depth of Debridement: subcutaneous tissue. Technique: Sharp excisional debridement to bleeding, viable wound base.  Dressing: Dry, sterile, compression  dressing. Disposition: Patient tolerated procedure well.   Return in about 2 weeks (around 02/23/2021) for wound care.

## 2021-02-12 DIAGNOSIS — D631 Anemia in chronic kidney disease: Secondary | ICD-10-CM | POA: Diagnosis not present

## 2021-02-12 DIAGNOSIS — Z992 Dependence on renal dialysis: Secondary | ICD-10-CM | POA: Diagnosis not present

## 2021-02-12 DIAGNOSIS — D509 Iron deficiency anemia, unspecified: Secondary | ICD-10-CM | POA: Diagnosis not present

## 2021-02-12 DIAGNOSIS — N186 End stage renal disease: Secondary | ICD-10-CM | POA: Diagnosis not present

## 2021-02-12 DIAGNOSIS — N2581 Secondary hyperparathyroidism of renal origin: Secondary | ICD-10-CM | POA: Diagnosis not present

## 2021-02-15 DIAGNOSIS — D631 Anemia in chronic kidney disease: Secondary | ICD-10-CM | POA: Diagnosis not present

## 2021-02-15 DIAGNOSIS — Z992 Dependence on renal dialysis: Secondary | ICD-10-CM | POA: Diagnosis not present

## 2021-02-15 DIAGNOSIS — D509 Iron deficiency anemia, unspecified: Secondary | ICD-10-CM | POA: Diagnosis not present

## 2021-02-15 DIAGNOSIS — N186 End stage renal disease: Secondary | ICD-10-CM | POA: Diagnosis not present

## 2021-02-15 DIAGNOSIS — N2581 Secondary hyperparathyroidism of renal origin: Secondary | ICD-10-CM | POA: Diagnosis not present

## 2021-02-17 DIAGNOSIS — D631 Anemia in chronic kidney disease: Secondary | ICD-10-CM | POA: Diagnosis not present

## 2021-02-17 DIAGNOSIS — N186 End stage renal disease: Secondary | ICD-10-CM | POA: Diagnosis not present

## 2021-02-17 DIAGNOSIS — D509 Iron deficiency anemia, unspecified: Secondary | ICD-10-CM | POA: Diagnosis not present

## 2021-02-17 DIAGNOSIS — Z992 Dependence on renal dialysis: Secondary | ICD-10-CM | POA: Diagnosis not present

## 2021-02-17 DIAGNOSIS — N2581 Secondary hyperparathyroidism of renal origin: Secondary | ICD-10-CM | POA: Diagnosis not present

## 2021-02-19 DIAGNOSIS — D509 Iron deficiency anemia, unspecified: Secondary | ICD-10-CM | POA: Diagnosis not present

## 2021-02-19 DIAGNOSIS — D631 Anemia in chronic kidney disease: Secondary | ICD-10-CM | POA: Diagnosis not present

## 2021-02-19 DIAGNOSIS — Z992 Dependence on renal dialysis: Secondary | ICD-10-CM | POA: Diagnosis not present

## 2021-02-19 DIAGNOSIS — N186 End stage renal disease: Secondary | ICD-10-CM | POA: Diagnosis not present

## 2021-02-19 DIAGNOSIS — N2581 Secondary hyperparathyroidism of renal origin: Secondary | ICD-10-CM | POA: Diagnosis not present

## 2021-02-22 DIAGNOSIS — Z992 Dependence on renal dialysis: Secondary | ICD-10-CM | POA: Diagnosis not present

## 2021-02-22 DIAGNOSIS — D631 Anemia in chronic kidney disease: Secondary | ICD-10-CM | POA: Diagnosis not present

## 2021-02-22 DIAGNOSIS — D509 Iron deficiency anemia, unspecified: Secondary | ICD-10-CM | POA: Diagnosis not present

## 2021-02-22 DIAGNOSIS — N186 End stage renal disease: Secondary | ICD-10-CM | POA: Diagnosis not present

## 2021-02-22 DIAGNOSIS — N2581 Secondary hyperparathyroidism of renal origin: Secondary | ICD-10-CM | POA: Diagnosis not present

## 2021-02-24 DIAGNOSIS — D509 Iron deficiency anemia, unspecified: Secondary | ICD-10-CM | POA: Diagnosis not present

## 2021-02-24 DIAGNOSIS — N186 End stage renal disease: Secondary | ICD-10-CM | POA: Diagnosis not present

## 2021-02-24 DIAGNOSIS — Z992 Dependence on renal dialysis: Secondary | ICD-10-CM | POA: Diagnosis not present

## 2021-02-24 DIAGNOSIS — N2581 Secondary hyperparathyroidism of renal origin: Secondary | ICD-10-CM | POA: Diagnosis not present

## 2021-02-24 DIAGNOSIS — D631 Anemia in chronic kidney disease: Secondary | ICD-10-CM | POA: Diagnosis not present

## 2021-02-25 ENCOUNTER — Ambulatory Visit: Payer: Medicare Other | Admitting: Podiatry

## 2021-02-26 DIAGNOSIS — D631 Anemia in chronic kidney disease: Secondary | ICD-10-CM | POA: Diagnosis not present

## 2021-02-26 DIAGNOSIS — N2581 Secondary hyperparathyroidism of renal origin: Secondary | ICD-10-CM | POA: Diagnosis not present

## 2021-02-26 DIAGNOSIS — D509 Iron deficiency anemia, unspecified: Secondary | ICD-10-CM | POA: Diagnosis not present

## 2021-02-26 DIAGNOSIS — Z992 Dependence on renal dialysis: Secondary | ICD-10-CM | POA: Diagnosis not present

## 2021-02-26 DIAGNOSIS — N186 End stage renal disease: Secondary | ICD-10-CM | POA: Diagnosis not present

## 2021-03-01 DIAGNOSIS — D509 Iron deficiency anemia, unspecified: Secondary | ICD-10-CM | POA: Diagnosis not present

## 2021-03-01 DIAGNOSIS — D631 Anemia in chronic kidney disease: Secondary | ICD-10-CM | POA: Diagnosis not present

## 2021-03-01 DIAGNOSIS — N2581 Secondary hyperparathyroidism of renal origin: Secondary | ICD-10-CM | POA: Diagnosis not present

## 2021-03-01 DIAGNOSIS — Z992 Dependence on renal dialysis: Secondary | ICD-10-CM | POA: Diagnosis not present

## 2021-03-01 DIAGNOSIS — N186 End stage renal disease: Secondary | ICD-10-CM | POA: Diagnosis not present

## 2021-03-03 DIAGNOSIS — Z992 Dependence on renal dialysis: Secondary | ICD-10-CM | POA: Diagnosis not present

## 2021-03-03 DIAGNOSIS — D509 Iron deficiency anemia, unspecified: Secondary | ICD-10-CM | POA: Diagnosis not present

## 2021-03-03 DIAGNOSIS — N186 End stage renal disease: Secondary | ICD-10-CM | POA: Diagnosis not present

## 2021-03-03 DIAGNOSIS — D631 Anemia in chronic kidney disease: Secondary | ICD-10-CM | POA: Diagnosis not present

## 2021-03-03 DIAGNOSIS — N2581 Secondary hyperparathyroidism of renal origin: Secondary | ICD-10-CM | POA: Diagnosis not present

## 2021-03-04 ENCOUNTER — Other Ambulatory Visit: Payer: Self-pay | Admitting: Internal Medicine

## 2021-03-04 ENCOUNTER — Ambulatory Visit: Payer: Medicare Other | Admitting: Podiatry

## 2021-03-04 DIAGNOSIS — I10 Essential (primary) hypertension: Secondary | ICD-10-CM

## 2021-03-05 DIAGNOSIS — Z992 Dependence on renal dialysis: Secondary | ICD-10-CM | POA: Diagnosis not present

## 2021-03-05 DIAGNOSIS — D631 Anemia in chronic kidney disease: Secondary | ICD-10-CM | POA: Diagnosis not present

## 2021-03-05 DIAGNOSIS — N186 End stage renal disease: Secondary | ICD-10-CM | POA: Diagnosis not present

## 2021-03-05 DIAGNOSIS — D509 Iron deficiency anemia, unspecified: Secondary | ICD-10-CM | POA: Diagnosis not present

## 2021-03-05 DIAGNOSIS — N2581 Secondary hyperparathyroidism of renal origin: Secondary | ICD-10-CM | POA: Diagnosis not present

## 2021-03-08 DIAGNOSIS — D631 Anemia in chronic kidney disease: Secondary | ICD-10-CM | POA: Diagnosis not present

## 2021-03-08 DIAGNOSIS — N2581 Secondary hyperparathyroidism of renal origin: Secondary | ICD-10-CM | POA: Diagnosis not present

## 2021-03-08 DIAGNOSIS — D509 Iron deficiency anemia, unspecified: Secondary | ICD-10-CM | POA: Diagnosis not present

## 2021-03-08 DIAGNOSIS — Z992 Dependence on renal dialysis: Secondary | ICD-10-CM | POA: Diagnosis not present

## 2021-03-08 DIAGNOSIS — N186 End stage renal disease: Secondary | ICD-10-CM | POA: Diagnosis not present

## 2021-03-10 DIAGNOSIS — N2581 Secondary hyperparathyroidism of renal origin: Secondary | ICD-10-CM | POA: Diagnosis not present

## 2021-03-10 DIAGNOSIS — D509 Iron deficiency anemia, unspecified: Secondary | ICD-10-CM | POA: Diagnosis not present

## 2021-03-10 DIAGNOSIS — N186 End stage renal disease: Secondary | ICD-10-CM | POA: Diagnosis not present

## 2021-03-10 DIAGNOSIS — D631 Anemia in chronic kidney disease: Secondary | ICD-10-CM | POA: Diagnosis not present

## 2021-03-10 DIAGNOSIS — Z992 Dependence on renal dialysis: Secondary | ICD-10-CM | POA: Diagnosis not present

## 2021-03-12 DIAGNOSIS — N2581 Secondary hyperparathyroidism of renal origin: Secondary | ICD-10-CM | POA: Diagnosis not present

## 2021-03-12 DIAGNOSIS — D509 Iron deficiency anemia, unspecified: Secondary | ICD-10-CM | POA: Diagnosis not present

## 2021-03-12 DIAGNOSIS — N186 End stage renal disease: Secondary | ICD-10-CM | POA: Diagnosis not present

## 2021-03-12 DIAGNOSIS — Z992 Dependence on renal dialysis: Secondary | ICD-10-CM | POA: Diagnosis not present

## 2021-03-12 DIAGNOSIS — D631 Anemia in chronic kidney disease: Secondary | ICD-10-CM | POA: Diagnosis not present

## 2021-03-15 DIAGNOSIS — N186 End stage renal disease: Secondary | ICD-10-CM | POA: Diagnosis not present

## 2021-03-15 DIAGNOSIS — Z992 Dependence on renal dialysis: Secondary | ICD-10-CM | POA: Diagnosis not present

## 2021-03-15 DIAGNOSIS — N2581 Secondary hyperparathyroidism of renal origin: Secondary | ICD-10-CM | POA: Diagnosis not present

## 2021-03-15 DIAGNOSIS — D631 Anemia in chronic kidney disease: Secondary | ICD-10-CM | POA: Diagnosis not present

## 2021-03-15 DIAGNOSIS — D509 Iron deficiency anemia, unspecified: Secondary | ICD-10-CM | POA: Diagnosis not present

## 2021-03-17 DIAGNOSIS — N2581 Secondary hyperparathyroidism of renal origin: Secondary | ICD-10-CM | POA: Diagnosis not present

## 2021-03-17 DIAGNOSIS — D509 Iron deficiency anemia, unspecified: Secondary | ICD-10-CM | POA: Diagnosis not present

## 2021-03-17 DIAGNOSIS — Z992 Dependence on renal dialysis: Secondary | ICD-10-CM | POA: Diagnosis not present

## 2021-03-17 DIAGNOSIS — D631 Anemia in chronic kidney disease: Secondary | ICD-10-CM | POA: Diagnosis not present

## 2021-03-17 DIAGNOSIS — N186 End stage renal disease: Secondary | ICD-10-CM | POA: Diagnosis not present

## 2021-03-19 DIAGNOSIS — N186 End stage renal disease: Secondary | ICD-10-CM | POA: Diagnosis not present

## 2021-03-19 DIAGNOSIS — N2581 Secondary hyperparathyroidism of renal origin: Secondary | ICD-10-CM | POA: Diagnosis not present

## 2021-03-19 DIAGNOSIS — D631 Anemia in chronic kidney disease: Secondary | ICD-10-CM | POA: Diagnosis not present

## 2021-03-19 DIAGNOSIS — Z992 Dependence on renal dialysis: Secondary | ICD-10-CM | POA: Diagnosis not present

## 2021-03-19 DIAGNOSIS — D509 Iron deficiency anemia, unspecified: Secondary | ICD-10-CM | POA: Diagnosis not present

## 2021-03-22 DIAGNOSIS — D631 Anemia in chronic kidney disease: Secondary | ICD-10-CM | POA: Diagnosis not present

## 2021-03-22 DIAGNOSIS — N186 End stage renal disease: Secondary | ICD-10-CM | POA: Diagnosis not present

## 2021-03-22 DIAGNOSIS — D509 Iron deficiency anemia, unspecified: Secondary | ICD-10-CM | POA: Diagnosis not present

## 2021-03-22 DIAGNOSIS — N2581 Secondary hyperparathyroidism of renal origin: Secondary | ICD-10-CM | POA: Diagnosis not present

## 2021-03-22 DIAGNOSIS — Z992 Dependence on renal dialysis: Secondary | ICD-10-CM | POA: Diagnosis not present

## 2021-03-23 ENCOUNTER — Ambulatory Visit (INDEPENDENT_AMBULATORY_CARE_PROVIDER_SITE_OTHER): Payer: Self-pay | Admitting: Podiatry

## 2021-03-23 DIAGNOSIS — Z91199 Patient's noncompliance with other medical treatment and regimen due to unspecified reason: Secondary | ICD-10-CM

## 2021-03-24 DIAGNOSIS — N186 End stage renal disease: Secondary | ICD-10-CM | POA: Diagnosis not present

## 2021-03-24 DIAGNOSIS — D509 Iron deficiency anemia, unspecified: Secondary | ICD-10-CM | POA: Diagnosis not present

## 2021-03-24 DIAGNOSIS — D631 Anemia in chronic kidney disease: Secondary | ICD-10-CM | POA: Diagnosis not present

## 2021-03-24 DIAGNOSIS — Z992 Dependence on renal dialysis: Secondary | ICD-10-CM | POA: Diagnosis not present

## 2021-03-24 DIAGNOSIS — N2581 Secondary hyperparathyroidism of renal origin: Secondary | ICD-10-CM | POA: Diagnosis not present

## 2021-03-26 DIAGNOSIS — D631 Anemia in chronic kidney disease: Secondary | ICD-10-CM | POA: Diagnosis not present

## 2021-03-26 DIAGNOSIS — N2581 Secondary hyperparathyroidism of renal origin: Secondary | ICD-10-CM | POA: Diagnosis not present

## 2021-03-26 DIAGNOSIS — D509 Iron deficiency anemia, unspecified: Secondary | ICD-10-CM | POA: Diagnosis not present

## 2021-03-26 DIAGNOSIS — N186 End stage renal disease: Secondary | ICD-10-CM | POA: Diagnosis not present

## 2021-03-26 DIAGNOSIS — Z992 Dependence on renal dialysis: Secondary | ICD-10-CM | POA: Diagnosis not present

## 2021-03-27 DIAGNOSIS — N186 End stage renal disease: Secondary | ICD-10-CM | POA: Diagnosis not present

## 2021-03-27 DIAGNOSIS — Z992 Dependence on renal dialysis: Secondary | ICD-10-CM | POA: Diagnosis not present

## 2021-03-30 NOTE — Progress Notes (Signed)
Patient was no-show for appointment today 

## 2021-04-13 ENCOUNTER — Other Ambulatory Visit: Payer: Self-pay

## 2021-04-13 ENCOUNTER — Encounter: Payer: Self-pay | Admitting: Podiatry

## 2021-04-13 ENCOUNTER — Ambulatory Visit (INDEPENDENT_AMBULATORY_CARE_PROVIDER_SITE_OTHER): Payer: Medicaid Other | Admitting: Podiatry

## 2021-04-13 DIAGNOSIS — E08621 Diabetes mellitus due to underlying condition with foot ulcer: Secondary | ICD-10-CM

## 2021-04-13 DIAGNOSIS — L97522 Non-pressure chronic ulcer of other part of left foot with fat layer exposed: Secondary | ICD-10-CM

## 2021-04-13 NOTE — Progress Notes (Signed)
°  Subjective:  Patient ID: Janet Mitchell, female    DOB: 1965-09-19,  MRN: [id number]  Chief Complaint  Patient presents with   Diabetic Ulcer    Left foot follow up   Nail Problem    Thick painful toenails    56 y.o. female presents with the above complaint. History confirmed with patient.  Has not completed her lab work yet, says she needs a new copy of the paperwork and she will get it done this time.  Also does not have the surgical shoe or insert with her at this time.    Objective:  Physical Exam: warm, good capillary refill, no trophic changes or ulcerative lesions, normal DP and PT pulses, and reduced sensation to monofilament. Left Foot:  Prior hallux amputation there is a full-thickness ulceration measuring 1.0 x 0.8 x 0.5 cm probes to subcutaneous tissue no exposed bone tendon or joint no purulence or malodor, no cellulitis    Multiple view left foot radiographs taken today show prior partial first ray amputation, there is some heterotopic ossification, nothing significantly prominent Assessment:   1. Diabetic ulcer of left foot associated with diabetes mellitus due to underlying condition, with fat layer exposed, unspecified part of foot ([place])      Plan:  Patient was evaluated and treated and all questions answered.  Ulcer left foot -We discussed the etiology and factors that are a part of the wound healing process.  We also discussed the risk of infection both soft tissue and osteomyelitis from open ulceration.  Discussed the risk of limb loss if this happens or worsens. -Debridement as below. -Dressed with Iodosorb, DSD. -She has not been using the surgical shoe or peg assist device anymore AGAINST MEDICAL ADVICE.  She says she has a difficult time remembering to put it on.  We discussed the option of total contact cast and we may want to consider this at next visit and she says she would be okay with doing this -Encouraged her to complete her lab work, I  provided her new paperwork today.  Procedure: Excisional Debridement of Wound Rationale: Removal of non-viable soft tissue from the wound to promote healing.  Anesthesia: none Post-Debridement Wound Measurements: 0.9 x 1.2 x 0.6 Type of Debridement: Sharp Excisional Tissue Removed: Non-viable soft tissue Depth of Debridement: subcutaneous tissue. Technique: Sharp excisional debridement to bleeding, viable wound base.  Dressing: Dry, sterile, compression dressing. Disposition: Patient tolerated procedure well.   Return in about 3 weeks (around 05/04/2021) for wound care, total contact casting.

## 2021-04-22 ENCOUNTER — Other Ambulatory Visit: Payer: Self-pay | Admitting: Internal Medicine

## 2021-04-22 DIAGNOSIS — I10 Essential (primary) hypertension: Secondary | ICD-10-CM

## 2021-04-28 LAB — HEMOGLOBIN A1C: Hemoglobin A1C: 6.8

## 2021-05-04 ENCOUNTER — Ambulatory Visit: Payer: 59 | Admitting: Podiatry

## 2021-05-04 ENCOUNTER — Telehealth: Payer: Self-pay | Admitting: Podiatry

## 2021-05-04 NOTE — Telephone Encounter (Signed)
Patient would like a follow up on blood work. She was suppose to have some labs done before her next appointment.  [employer] [facility] on [address] in [place].  Please Advise

## 2021-05-04 NOTE — Telephone Encounter (Signed)
Yes it is. Thanks.

## 2021-05-06 ENCOUNTER — Ambulatory Visit: Payer: 59 | Admitting: Podiatry

## 2021-05-07 LAB — CBC WITH DIFFERENTIAL/PLATELET

## 2021-05-08 LAB — CBC WITH DIFFERENTIAL/PLATELET
Basophils Absolute: 0.1 10*3/uL (ref 0.0–0.2)
Basos: 1 %
EOS (ABSOLUTE): 0.3 10*3/uL (ref 0.0–0.4)
Eos: 6 %
Hematocrit: 36.2 % (ref 34.0–46.6)
Hemoglobin: 11.3 g/dL (ref 11.1–15.9)
Immature Grans (Abs): 0 10*3/uL (ref 0.0–0.1)
Immature Granulocytes: 0 %
Lymphocytes Absolute: 1.5 10*3/uL (ref 0.7–3.1)
Lymphs: 36 %
MCH: 23.8 pg — ABNORMAL LOW (ref 26.6–33.0)
MCHC: 31.2 g/dL — ABNORMAL LOW (ref 31.5–35.7)
MCV: 76 fL — ABNORMAL LOW (ref 79–97)
Monocytes Absolute: 0.9 10*3/uL (ref 0.1–0.9)
Monocytes: 20 %
Neutrophils Absolute: 1.6 10*3/uL (ref 1.4–7.0)
Neutrophils: 37 %
Platelets: 101 10*3/uL — ABNORMAL LOW (ref 150–450)
RBC: 4.74 x10E6/uL (ref 3.77–5.28)
RDW: 18 % — ABNORMAL HIGH (ref 11.7–15.4)
WBC: 4.3 10*3/uL (ref 3.4–10.8)

## 2021-05-08 LAB — C-REACTIVE PROTEIN: CRP: 4 mg/L (ref 0–10)

## 2021-05-08 LAB — SEDIMENTATION RATE: Sed Rate: 57 mm/hr — ABNORMAL HIGH (ref 0–40)

## 2021-05-10 ENCOUNTER — Ambulatory Visit: Payer: 59 | Admitting: Podiatry

## 2021-05-17 ENCOUNTER — Ambulatory Visit: Payer: 59 | Admitting: Internal Medicine

## 2021-05-18 ENCOUNTER — Ambulatory Visit: Payer: 59 | Admitting: Podiatry

## 2021-05-18 ENCOUNTER — Ambulatory Visit: Payer: Medicare Other | Admitting: Internal Medicine

## 2021-05-26 ENCOUNTER — Ambulatory Visit: Payer: Medicaid Other | Admitting: Internal Medicine

## 2021-05-27 ENCOUNTER — Ambulatory Visit: Payer: Medicaid Other | Admitting: Internal Medicine

## 2021-06-01 ENCOUNTER — Encounter (INDEPENDENT_AMBULATORY_CARE_PROVIDER_SITE_OTHER): Payer: Self-pay

## 2021-06-01 LAB — HM DIABETES EYE EXAM

## 2021-06-03 ENCOUNTER — Encounter: Payer: Self-pay | Admitting: *Deleted

## 2021-06-03 ENCOUNTER — Other Ambulatory Visit: Payer: Self-pay

## 2021-06-03 ENCOUNTER — Encounter: Payer: Self-pay | Admitting: Internal Medicine

## 2021-06-03 ENCOUNTER — Ambulatory Visit (INDEPENDENT_AMBULATORY_CARE_PROVIDER_SITE_OTHER): Payer: 59 | Admitting: Internal Medicine

## 2021-06-03 VITALS — BP 112/82 | HR 62 | Resp 18 | Ht 67.0 in | Wt 218.4 lb

## 2021-06-03 DIAGNOSIS — R197 Diarrhea, unspecified: Secondary | ICD-10-CM

## 2021-06-03 DIAGNOSIS — N186 End stage renal disease: Secondary | ICD-10-CM

## 2021-06-03 DIAGNOSIS — Z992 Dependence on renal dialysis: Secondary | ICD-10-CM | POA: Diagnosis not present

## 2021-06-03 DIAGNOSIS — E1122 Type 2 diabetes mellitus with diabetic chronic kidney disease: Secondary | ICD-10-CM | POA: Diagnosis not present

## 2021-06-03 DIAGNOSIS — I10 Essential (primary) hypertension: Secondary | ICD-10-CM | POA: Diagnosis not present

## 2021-06-03 MED ORDER — ACCU-CHEK SOFTCLIX LANCETS MISC: 5 refills | Status: DC

## 2021-06-03 MED ORDER — LABETALOL HCL 200 MG PO TABS: 200.0000 mg | ORAL_TABLET | Freq: Two times a day (BID) | ORAL | 2 refills | Status: DC

## 2021-06-03 MED ORDER — ACCU-CHEK GUIDE VI STRP: ORAL_STRIP | 5 refills | Status: DC

## 2021-06-03 NOTE — Assessment & Plan Note (Signed)
Chronic, related to bile acid malabsorption ?Continue Questran 2 gm QD for now ?Advised to f/u with GI ?

## 2021-06-03 NOTE — Assessment & Plan Note (Signed)
Followed by Nephrology On HD MWF 

## 2021-06-03 NOTE — Assessment & Plan Note (Signed)
BP Readings from Last 1 Encounters:  ?06/03/21 112/82  ? ?Patient has h/o significantly elevated BP, now improved ?Continue Losartan 100 mg QD and Hydralazine 10 mg BID ?Decreased Labetalol dose to 200 mg BID today ?Had discontinued Nifedipine and Clonidine as per Nephrology ?Counseled for compliance with the medications ?Advised DASH diet and moderate exercise/walking, at least 150 mins/week ?

## 2021-06-03 NOTE — Progress Notes (Signed)
Established Patient Office Visit  Subjective:  Patient ID: Janet Mitchell, female    DOB: [birth year]  Age: 56 y.o. MRN: [id number]  CC:  Chief Complaint  Patient presents with   Acute Visit    Pt has been having bp issues its up and down but when it drops she passes out this has been happening her whole life but more recent here lately     HPI Janet Mitchell is a 56 y.o. female with past medical history of HTN and DM with ESRD on HD who presents for f/u of her chronic medical conditions.  HTN: Her BP is borderline low at home and during HD.  She is currently on losartan, Hydralazine and Labetalol currently. She has been advised to not take Hydralazine of her HD days due to hypotension. She still has episodes of dizziness at home, and her BP has been up to 95/55 at home. She denies any chest pain, dyspnea or palpitations currently. She also reports an episode of syncope at home after having dizziness.  DM: Last HbA1C was 6.8. She has not required insulin since being on HD.  Past Medical History:  Diagnosis Date   Abdominal wall hematoma 12/26/2019   Anemia    Arthritis    CHF (congestive heart failure) ([place])    pt states she has been cleared of heart failure/disease   Chronic cholecystitis with calculus    Chronic kidney disease    Phreesia 12/12/2019   COVID-19 03/26/2019   COVID-19 virus infection 03/2019   ESRD (end stage renal disease) on dialysis [facility])    Dialysis M-W-F   Headache    "a few/wk" (03/09/2018) - no longer having these   History of blood transfusion 10/2017   "low blood count" (03/09/2018)   Hypertension    Seasonal allergies    Spinal headache    Type II diabetes mellitus ([place])     Past Surgical History:  Procedure Laterality Date   AMPUTATION TOE Left 2013   Great toe   AV FISTULA PLACEMENT Left 07/22/2019   Procedure: LEFT ARM BASILIC ARTERIOVENOUS (AV) FISTULA CREATION;  Surgeon: Rosetta Posner, MD;  Location: [place];  Service: Vascular;   Laterality: Left;   [place] Left 10/17/2019   Procedure: LEFT SECOND STAGE [place];  Surgeon: Rosetta Posner, MD;  Location: [place];  Service: Vascular;  Laterality: Left;   BIOPSY  10/24/2018   Procedure: BIOPSY;  Surgeon: Daneil Dolin, MD;  [facility];  Service: Endoscopy;;  right and left colon   CATARACT EXTRACTION W/ INTRAOCULAR LENS IMPLANT Right    [place]; [place] N/A 03/09/2018   Procedure: LAPAROSCOPIC CHOLECYSTECTOMY WITH INTRAOPERATIVE CHOLANGIOGRAM ERAS PATHWAY;  Surgeon: Donnie Mesa, MD;  Location: [place];  Service: General;  Laterality: N/A;   COLONOSCOPY N/A 10/24/2018   Procedure: COLONOSCOPY;  Surgeon: Daneil Dolin, MD;  [facility];  Service: Endoscopy;  Laterality: N/A;  1:00pm   EYE SURGERY Left 05/15/2018   Removal of blood in the globe (due to DM)   FLEXIBLE SIGMOIDOSCOPY N/A 11/24/2017   Procedure: FLEXIBLE SIGMOIDOSCOPY;  Surgeon: Daneil Dolin, MD;  [facility];  Service: Endoscopy;  Laterality: N/A;   IR FLUORO GUIDE CV LINE RIGHT  07/17/2019   IR PARACENTESIS  07/09/2019   IR US GUIDE VASC ACCESS RIGHT  07/17/2019   LAPAROSCOPIC CHOLECYSTECTOMY  03/09/2018   PERITONEAL CATHETER INSERTION  2017  POLYPECTOMY  10/24/2018   Procedure: POLYPECTOMY;  Surgeon: Daneil Dolin, MD;  [facility];  Service: Endoscopy;;   TOTAL HIP ARTHROPLASTY Left 1997    Family History  Problem Relation Age of Onset   Heart disease Mother    Thrombocytopenia Mother        TTP   Heart failure Father    Kidney disease Paternal Grandfather    Colon cancer Neg Hx     Social History   Socioeconomic History   Marital status: Married    Spouse name: Not on file   Number of children: 2   Years of education: Not on file   Highest education level: Not on file  Occupational History   Not on file  Tobacco Use   Smoking status: Never    Passive exposure: Yes    Smokeless tobacco: Never  Vaping Use   Vaping Use: Never used  Substance and Sexual Activity   Alcohol use: Never   Drug use: Never   Sexual activity: Yes    Birth control/protection: I.U.D.    Comment: Mirena IUD  Other Topics Concern   Not on file  Social History Narrative   Not on file   Social Determinants of Health   Financial Resource Strain: Low Risk    Difficulty of Paying Living Expenses: Not hard at all  Food Insecurity: No Food Insecurity   Worried About Charity fundraiser in the Last Year: Never true   Arboriculturist in the Last Year: Never true  Transportation Needs: No Transportation Needs   Lack of Transportation (Medical): No   Lack of Transportation (Non-Medical): No  Physical Activity: Inactive   Days of Exercise per Week: 0 days   Minutes of Exercise per Session: 0 min  Stress: No Stress Concern Present   Feeling of Stress : Not at all  Social Connections: Socially Integrated   Frequency of Communication with Friends and Family: More than three times a week   Frequency of Social Gatherings with Friends and Family: More than three times a week   Attends Religious Services: More than 4 times per year   Active Member of [employer] or Organizations: Yes   Attends Music therapist: More than 4 times per year   Marital Status: Married  Human resources officer Violence: Not At Risk   Fear of Current or Ex-Partner: No   Emotionally Abused: No   Physically Abused: No   Sexually Abused: No    Outpatient Medications Prior to Visit  Medication Sig Dispense Refill   acetaminophen (TYLENOL) 325 MG tablet Take 650 mg by mouth every 6 (six) hours as needed (pain).     aspirin EC 81 MG tablet Take 1 tablet (81 mg total) by mouth daily.     B Complex-C-Folic Acid (RENA-VITE PO) Take 1 tablet by mouth daily.     Blood Glucose Monitoring Suppl (ACCU-CHEK GUIDE) w/Device KIT 1 Piece by Does not apply route as directed. 1 kit 0   cadexomer iodine (IODOSORB) 0.9 % gel  Apply to left foot ulcer once daily. 40 g 0   calcitRIOL (ROCALTROL) 0.5 MCG capsule Take 1-2 capsules (0.5-1 mcg total) by mouth See admin instructions. 0.45mcg on Mondays through Fridays and take 31mcg on Saturdays and Sundays 90 capsule 1   calcium acetate (PHOSLO) 667 MG capsule Take by mouth.     cetirizine (ZYRTEC) 10 MG tablet TAKE 1 TABLET BY MOUTH ONCE DAILY. 30 tablet 0   cholestyramine (  QUESTRAN) 4 g packet Take 0.5 packets (2 g total) by mouth daily. 60 each 1   hydrALAZINE (APRESOLINE) 10 MG tablet Take 10 mg by mouth 2 (two) times daily.     losartan (COZAAR) 100 MG tablet TAKE ONE TABLET BY MOUTH DAILY 90 tablet 0   psyllium (METAMUCIL) 58.6 % packet Take by mouth.     Accu-Chek Softclix Lancets lancets See admin instructions.     glucose blood (ACCU-CHEK GUIDE) test strip Use as instructed 150 each 2   hydrALAZINE (APRESOLINE) 100 MG tablet Take 100 mg by mouth 2 (two) times daily.     insulin glargine, 2 Unit Dial, (TOUJEO MAX SOLOSTAR) 300 UNIT/ML Solostar Pen Inject 10 Units into the skin daily.     labetalol (NORMODYNE) 300 MG tablet TAKE 1 TABLET BY MOUTH TWICE DAILY. 60 tablet 3   NIFEdipine (PROCARDIA-XL/NIFEDICAL-XL) 30 MG 24 hr tablet Take by mouth.     hydrALAZINE (APRESOLINE) 10 MG tablet Take 10 mg by mouth 2 (two) times daily.     No facility-administered medications prior to visit.    No Known Allergies  ROS Review of Systems  Constitutional:  Positive for fatigue. Negative for chills and fever.  HENT:  Negative for congestion, sinus pressure, sinus pain and sore throat.   Eyes:  Negative for pain and discharge.  Respiratory:  Negative for cough and shortness of breath.   Cardiovascular:  Negative for chest pain and palpitations.  Gastrointestinal:  Negative for abdominal pain, constipation, diarrhea, nausea and vomiting.  Endocrine: Negative for polydipsia and polyuria.  Genitourinary:  Negative for dysuria and hematuria.  Musculoskeletal:  Negative for  neck pain and neck stiffness.  Skin:  Negative for rash.  Neurological:  Positive for dizziness and syncope. Negative for weakness.  Psychiatric/Behavioral:  Negative for agitation and behavioral problems.      Objective:    Physical Exam Vitals reviewed.  Constitutional:      General: She is not in acute distress.    Appearance: She is not diaphoretic.  HENT:     Head: Normocephalic and atraumatic.     Nose: Nose normal. No congestion.     Mouth/Throat:     Mouth: Mucous membranes are moist.     Pharynx: No posterior oropharyngeal erythema.  Eyes:     General: No scleral icterus.    Extraocular Movements: Extraocular movements intact.  Cardiovascular:     Rate and Rhythm: Normal rate and regular rhythm.     Pulses: Normal pulses.     Heart sounds: Normal heart sounds. No murmur heard. Pulmonary:     Breath sounds: Normal breath sounds. No wheezing or rales.  Musculoskeletal:     Cervical back: Neck supple. No tenderness.     Right lower leg: No edema.     Left lower leg: No edema.  Skin:    General: Skin is warm.     Findings: No rash.     Comments: AV fistula on left UE  Neurological:     General: No focal deficit present.     Mental Status: She is alert and oriented to person, place, and time.  Psychiatric:        Mood and Affect: Mood normal.        Behavior: Behavior normal.    BP 112/82 (BP Location: Right Arm, Patient Position: Sitting, Cuff Size: Normal)    Pulse 62    Resp 18    Ht $R'5\' 7"'OU$  (1.702 m)    Wt 218  lb 6.4 oz (99.1 kg)    SpO2 99%    BMI 34.21 kg/m  Wt Readings from Last 3 Encounters:  06/03/21 218 lb 6.4 oz (99.1 kg)  08/04/20 209 lb 12.8 oz (95.2 kg)  07/14/20 215 lb (97.5 kg)    No results found for: TSH Lab Results  Component Value Date   WBC 4.3 05/06/2021   HGB 11.3 05/06/2021   HCT 36.2 05/06/2021   MCV 76 (L) 05/06/2021   PLT 101 (L) 05/06/2021   Lab Results  Component Value Date   NA 138 10/17/2019   K 4.7 10/17/2019   CO2 30  08/29/2019   GLUCOSE 221 (H) 10/17/2019   BUN 42 (H) 10/17/2019   CREATININE 4.70 (H) 10/17/2019   BILITOT 0.5 08/29/2019   ALKPHOS 63 07/30/2019   AST 16 08/29/2019   ALT 15 08/29/2019   PROT 8.1 08/29/2019   ALBUMIN 2.4 (L) 07/30/2019   CALCIUM 9.2 08/29/2019   ANIONGAP 11 07/30/2019   Lab Results  Component Value Date   CHOL 114 08/29/2019   Lab Results  Component Value Date   HDL 35 (L) 08/29/2019   Lab Results  Component Value Date   LDLCALC 62 08/29/2019   Lab Results  Component Value Date   TRIG 91 08/29/2019   Lab Results  Component Value Date   CHOLHDL 3.3 08/29/2019   Lab Results  Component Value Date   HGBA1C 6.8 04/28/2021      Assessment & Plan:   Problem List Items Addressed This Visit       Cardiovascular and Mediastinum   Malignant hypertension - Primary    BP Readings from Last 1 Encounters:  06/03/21 112/82  Patient has h/o significantly elevated BP, now improved Continue Losartan 100 mg QD and Hydralazine 10 mg BID Decreased Labetalol dose to 200 mg BID today Had discontinued Nifedipine and Clonidine as per Nephrology Counseled for compliance with the medications Advised DASH diet and moderate exercise/walking, at least 150 mins/week      Relevant Medications   hydrALAZINE (APRESOLINE) 10 MG tablet   labetalol (NORMODYNE) 200 MG tablet     Endocrine   Type 2 diabetes mellitus with ESRD (end-stage renal disease) ([facility])    Lab Results  Component Value Date   HGBA1C 6.8 04/28/2021  Has not been Toujeo now Follows up with Nephrologist for ESRD on HD On kidney transplant list at [facility]      Relevant Medications   Accu-Chek Softclix Lancets lancets   glucose blood (ACCU-CHEK GUIDE) test strip     Genitourinary   ESRD (end stage renal disease) (H[facility])    Followed by Nephrology On HD MWF        Other   Diarrhea    Chronic, related to bile acid malabsorption Continue Questran 2 gm QD for now Advised to f/u with GI       Dependence on renal dialysis ([facility])    Meds ordered this encounter  Medications   labetalol (NORMODYNE) 200 MG tablet    Sig: Take 1 tablet (200 mg total) by mouth 2 (two) times daily.    Dispense:  60 tablet    Refill:  2   Accu-Chek Softclix Lancets lancets    Sig: Please use it to check blood glucose twice daily and PRN.    Dispense:  100 each    Refill:  5   glucose blood (ACCU-CHEK GUIDE) test strip    Sig: Use as instructed    Dispense:  100 each    Refill:  5    Follow-up: Return in about 3 months (around 09/03/2021) for HTN and DM.    Lindell Spar, MD

## 2021-06-03 NOTE — Assessment & Plan Note (Signed)
Lab Results  ?Component Value Date  ? HGBA1C 6.8 04/28/2021  ? ?Has not been Toujeo now ?Follows up with Nephrologist for ESRD on HD ?On kidney transplant list at [facility] ?

## 2021-06-03 NOTE — Patient Instructions (Signed)
Please start taking Labetalol 200 mg twice daily instead of 300 mg. ? ?Please consider getting Shingrix vaccine at your local pharmacy. ? ?Please continue to take other medications as prescribed. ?

## 2021-06-07 ENCOUNTER — Ambulatory Visit (INDEPENDENT_AMBULATORY_CARE_PROVIDER_SITE_OTHER): Payer: 59 | Admitting: Podiatry

## 2021-06-07 ENCOUNTER — Other Ambulatory Visit: Payer: Self-pay

## 2021-06-07 DIAGNOSIS — E08621 Diabetes mellitus due to underlying condition with foot ulcer: Secondary | ICD-10-CM

## 2021-06-07 DIAGNOSIS — L97522 Non-pressure chronic ulcer of other part of left foot with fat layer exposed: Secondary | ICD-10-CM | POA: Diagnosis not present

## 2021-06-07 DIAGNOSIS — M21962 Unspecified acquired deformity of left lower leg: Secondary | ICD-10-CM

## 2021-06-14 NOTE — Progress Notes (Signed)
?  Subjective:  ?Patient ID: Janet Mitchell, female    DOB: Oct 25, 1965,  MRN: [id number] ? ?Chief Complaint  ?Patient presents with  ? Diabetic Ulcer  ?    wound care, total contact casting, left foot  ? ? ?56 y.o. female returns today with the above complaint. History confirmed with patient.  She did complete the lab work at this time ? ? ? ? ?Objective:  ?Physical Exam: ?warm, good capillary refill, no trophic changes or ulcerative lesions, normal DP and PT pulses, and reduced sensation to monofilament. ?Left Foot:  Prior hallux amputation there is a full-thickness ulceration measuring 1.0 x 0.8 x 0.5 cm probes to subcutaneous tissue no exposed bone tendon or joint no purulence or malodor, no cellulitis ? ? ? ? ?Multiple view left foot radiographs taken today show prior partial first ray amputation, there is some heterotopic ossification, nothing significantly prominent ? ?ESR 57 CRP normal, CBC normal ?Assessment:  ? ?1. Diabetic ulcer of left foot associated with diabetes mellitus due to underlying condition, with fat layer exposed, unspecified part of foot ([place])   ?2. Acquired deformity of foot, left   ? ? ? ?Plan:  ?Patient was evaluated and treated and all questions answered. ? ?Ulcer left foot ?-We discussed the etiology and factors that are a part of the wound healing process.  We also discussed the risk of infection both soft tissue and osteomyelitis from open ulceration.  Discussed the risk of limb loss if this happens or worsens. ?-Debridement as below. ?-Dressed with Iodosorb, DSD. ?-Recommend we transition her to a Animator, this will be ordered for her and we will transition to collagen application with blast X gel as well. ? ?Procedure: Excisional Debridement of Wound ?Rationale: Removal of non-viable soft tissue from the wound to promote healing.  ?Anesthesia: none ?Post-Debridement Wound Measurements: 1.0 x 0.8 x 0.5 ?Type of Debridement: Sharp Excisional ?Tissue Removed:  Non-viable soft tissue ?Depth of Debridement: subcutaneous tissue. ?Technique: Sharp excisional debridement to bleeding, viable wound base.  ?Dressing: Dry, sterile, compression dressing. ?Disposition: Patient tolerated procedure well.  ? ?No follow-ups on file.  ? ? ?

## 2021-06-18 ENCOUNTER — Other Ambulatory Visit: Payer: Self-pay | Admitting: Family Medicine

## 2021-06-29 ENCOUNTER — Other Ambulatory Visit: Payer: Self-pay

## 2021-06-29 ENCOUNTER — Ambulatory Visit: Payer: 59 | Admitting: Podiatry

## 2021-06-29 ENCOUNTER — Ambulatory Visit (INDEPENDENT_AMBULATORY_CARE_PROVIDER_SITE_OTHER): Payer: 59 | Admitting: Ophthalmology

## 2021-06-29 ENCOUNTER — Encounter (INDEPENDENT_AMBULATORY_CARE_PROVIDER_SITE_OTHER): Payer: Self-pay | Admitting: Ophthalmology

## 2021-06-29 DIAGNOSIS — H4311 Vitreous hemorrhage, right eye: Secondary | ICD-10-CM | POA: Diagnosis not present

## 2021-06-29 DIAGNOSIS — E113591 Type 2 diabetes mellitus with proliferative diabetic retinopathy without macular edema, right eye: Secondary | ICD-10-CM | POA: Diagnosis not present

## 2021-06-29 DIAGNOSIS — E113552 Type 2 diabetes mellitus with stable proliferative diabetic retinopathy, left eye: Secondary | ICD-10-CM | POA: Diagnosis not present

## 2021-06-29 MED ORDER — BEVACIZUMAB 2.5 MG/0.1ML IZ SOSY
2.5000 mg | PREFILLED_SYRINGE | INTRAVITREAL | Status: AC | PRN
  Administered 2021-06-29: 2.5 mg via INTRAVITREAL

## 2021-06-29 NOTE — Assessment & Plan Note (Signed)
The nature of proliferative diabetic retinopathy was discussed with the patient as well as the potential for a vitreous hemorrhage and traction on the retina. Tight control of glucose, blood pressure, and serum lipids was recommended in order to slow further progression of disease. Cessation of smoking was recommended. Treatment options including ocular injectable medications, panretinal photocoagulation and/or vitrectomy surgery for advanced disease were discussed. Local medical therapy may slow or arrest progression of disease. ? ? ?OD with extensive retinal nonperfusion neovascularization elsewhere superiorly peripheral white lines of nonperfusion and large dot blot hemorrhages posing a very high risk given that there is already present early vitreous hemorrhage inferiorly. ? ?Will need intravitreal Avastin today to halt the progression of PDR so as to allow for subsequent peripheral PRP delivered in the coming 1 or 2 weeks OD ?

## 2021-06-29 NOTE — Assessment & Plan Note (Addendum)
The nature of regressed proliferative diabetic retinopathy was discussed with the patient. The patient was advised to maintain good glucose, blood pressure, monitor kidney function and serum lipid control as advised by personal physician. Rare risk for reactivation of progression exist with untreated severe anemia, untreated renal failure, untreated heart failure, and smoking. ?Complete avoidance of smoking was recommended. The chance of recurrent proliferative diabetic retinopathy was discussed as well as the chance of vitreous hemorrhage for which further treatments may be necessary.   Explained to the patient that the quiescent  proliferative diabetic retinopathy disease is unlikely to ever worsen.  Worsening factors would include however severe anemia, hypertension out-of-control or impending renal failure. ? ?Apparently regressed with some minor residual cystoid change in the macula on OCT.  Yet with good acuity. ? ?Patient is a post vitrectomized eye apparently performed at [facility] sometime in the past by doctors name not remembered because of the turnover at that office ?

## 2021-06-29 NOTE — Progress Notes (Signed)
? ? ?06/29/2021 ? ?  ? ?CHIEF COMPLAINT ?Patient presents for  ?Chief Complaint  ?Patient presents with  ? Retina Evaluation  ? ? ? ? ?HISTORY OF PRESENT ILLNESS: ?Janet Mitchell is a 56 y.o. female who presents to the clinic today for:  ? ?HPI   ? ? Retina Evaluation   ? ?      ? Laterality: both eyes  ? Associated Symptoms: Floaters (OD> OS), Photophobia and Glare.  Negative for Blind Spot and Pain  ? Context: distance vision, mid-range vision and near vision  ? ?  ?  ? ? Comments   ?NP- Moderate NDR Possible Macular Edema OS>OD, referred by Dr. Jorja Loa from [place]. ?Pt states "I have floaty things, they have been there for a while. I had laser on my left eye during COVID, but the doctor that did it does not work there anymore. The right eye was done but they never did the blood behind my eye because of COVID. Since 2020 or 2021 is the last time I had anything major done to the eyes. I have floaters really bad in the right eye." ?Patient only uses glasses to read or use the computer. She states she does not use them often. ?Patient states [facility] did not check vision with glasses, on the notes from [place] it shows they checked vision with glasses. ?  ? ?  ?  ?Last edited by Laurin Coder on 06/29/2021  9:55 AM.  ?  ? ? ?Referring physician: ?Madelin Headings, DO ?[address] ?[place],  [place] [address] ? ?HISTORICAL INFORMATION:  ? ?Selected notes from the [place] [facility] ?  ? ?Lab Results  ?Component Value Date  ? HGBA1C 6.8 04/28/2021  ?  ? ?CURRENT MEDICATIONS: ?No current outpatient medications on file. (Ophthalmic Drugs)  ? ?No current facility-administered medications for this visit. (Ophthalmic Drugs)  ? ?Current Outpatient Medications (Other)  ?Medication Sig  ? Accu-Chek Softclix Lancets lancets Please use it to check blood glucose twice daily and PRN.  ? acetaminophen (TYLENOL) 325 MG tablet Take 650 mg by mouth every 6 (six) hours as needed (pain).  ? aspirin EC 81 MG tablet Take 1 tablet (81 mg  total) by mouth daily.  ? B Complex-C-Folic Acid (RENA-VITE PO) Take 1 tablet by mouth daily.  ? Blood Glucose Monitoring Suppl (ACCU-CHEK GUIDE) w/Device KIT 1 Piece by Does not apply route as directed.  ? cadexomer iodine (IODOSORB) 0.9 % gel Apply to left foot ulcer once daily.  ? calcitRIOL (ROCALTROL) 0.5 MCG capsule Take 1-2 capsules (0.5-1 mcg total) by mouth See admin instructions. 0.46mg on Mondays through Fridays and take 156m on Saturdays and Sundays  ? calcium acetate (PHOSLO) 667 MG capsule Take by mouth.  ? cetirizine (ZYRTEC) 10 MG tablet TAKE 1 TABLET BY MOUTH ONCE DAILY.  ? cholestyramine (QUESTRAN) 4 g packet Take 0.5 packets (2 g total) by mouth daily.  ? glucose blood (ACCU-CHEK GUIDE) test strip Use as instructed  ? hydrALAZINE (APRESOLINE) 10 MG tablet Take 10 mg by mouth 2 (two) times daily.  ? labetalol (NORMODYNE) 200 MG tablet Take 1 tablet (200 mg total) by mouth 2 (two) times daily.  ? losartan (COZAAR) 100 MG tablet TAKE ONE TABLET BY MOUTH DAILY  ? psyllium (METAMUCIL) 58.6 % packet Take by mouth.  ? ?No current facility-administered medications for this visit. (Other)  ? ? ? ? ?REVIEW OF SYSTEMS: ?ROS   ?Negative for: Constitutional, Gastrointestinal, Neurological, Skin, Genitourinary, Musculoskeletal, HENT, Endocrine,  Cardiovascular, Eyes, Respiratory, Psychiatric, Allergic/Imm, Heme/Lymph ?Last edited by Hurman Horn, MD on 06/29/2021 11:09 AM.  ?  ? ? ? ?ALLERGIES ?No Known Allergies ? ?PAST MEDICAL HISTORY ?Past Medical History:  ?Diagnosis Date  ? Abdominal wall hematoma 12/26/2019  ? Anemia   ? Arthritis   ? CHF (congestive heart failure) ([place])   ? pt states she has been cleared of heart failure/disease  ? Chronic cholecystitis with calculus   ? Chronic kidney disease   ? Phreesia 12/12/2019  ? COVID-19 03/26/2019  ? COVID-19 virus infection 03/2019  ? ESRD (end stage renal disease) on dialysis [facility])   ? Dialysis M-W-F  ? Headache   ? "a few/wk" (03/09/2018) - no longer having  these  ? History of blood transfusion 10/2017  ? "low blood count" (03/09/2018)  ? Hypertension   ? Seasonal allergies   ? Spinal headache   ? Type II diabetes mellitus ([place])   ? ?Past Surgical History:  ?Procedure Laterality Date  ? AMPUTATION TOE Left 2013  ? Great toe  ? AV FISTULA PLACEMENT Left 07/22/2019  ? Procedure: LEFT ARM BASILIC ARTERIOVENOUS (AV) FISTULA CREATION;  Surgeon: Rosetta Posner, MD;  Location: [place];  Service: Vascular;  Laterality: Left;  ? BASCILIC VEIN TRANSPOSITION Left 10/17/2019  ? Procedure: LEFT SECOND STAGE BASCILIC VEIN TRANSPOSITION;  Surgeon: Rosetta Posner, MD;  Location: [place];  Service: Vascular;  Laterality: Left;  ? BIOPSY  10/24/2018  ? Procedure: BIOPSY;  Surgeon: Daneil Dolin, MD;  [facility];  Service: Endoscopy;;  right and left colon  ? CATARACT EXTRACTION W/ INTRAOCULAR LENS IMPLANT Right   ? [place]; 1998  ? CHOLECYSTECTOMY N/A 03/09/2018  ? Procedure: LAPAROSCOPIC CHOLECYSTECTOMY WITH INTRAOPERATIVE CHOLANGIOGRAM ERAS PATHWAY;  Surgeon: Donnie Mesa, MD;  Location: [place];  Service: General;  Laterality: N/A;  ? COLONOSCOPY N/A 10/24/2018  ? Procedure: COLONOSCOPY;  Surgeon: Daneil Dolin, MD;  [facility];  Service: Endoscopy;  Laterality: N/A;  1:00pm  ? EYE SURGERY Left 05/15/2018  ? Removal of blood in the globe (due to DM)  ? FLEXIBLE SIGMOIDOSCOPY N/A 11/24/2017  ? Procedure: FLEXIBLE SIGMOIDOSCOPY;  Surgeon: Daneil Dolin, MD;  [facility];  Service: Endoscopy;  Laterality: N/A;  ? IR FLUORO GUIDE CV LINE RIGHT  07/17/2019  ? IR PARACENTESIS  07/09/2019  ? IR US GUIDE VASC ACCESS RIGHT  07/17/2019  ? LAPAROSCOPIC CHOLECYSTECTOMY  03/09/2018  ? PERITONEAL CATHETER INSERTION  2017  ? POLYPECTOMY  10/24/2018  ? Procedure: POLYPECTOMY;  Surgeon: Daneil Dolin, MD;  [facility];  Service: Endoscopy;;  ? TOTAL HIP ARTHROPLASTY Left 1997  ? ? ?FAMILY HISTORY ?Family History  ?Problem Relation Age of Onset   ? Heart disease Mother   ? Thrombocytopenia Mother   ?     TTP  ? Heart failure Father   ? Kidney disease Paternal Grandfather   ? Colon cancer Neg Hx   ? ? ?SOCIAL HISTORY ?Social History  ? ?Tobacco Use  ? Smoking status: Never  ?  Passive exposure: Yes  ? Smokeless tobacco: Never  ?Vaping Use  ? Vaping Use: Never used  ?Substance Use Topics  ? Alcohol use: Never  ? Drug use: Never  ? ?  ? ?  ? ?OPHTHALMIC EXAM: ? ?Base Eye Exam   ? ? Visual Acuity (ETDRS)   ? ?   Right Left  ? Dist cc 20/40 +2  20/30 -2  ? Dist ph cc NI   ? ? Correction: Glasses  ? ?  ?  ? ? Tonometry (Tonopen, 9:55 AM)   ? ?   Right Left  ? Pressure 23 20  ? ?  ?  ? ? Pupils   ? ?   Pupils Dark Light Shape React APD  ? Right PERRL 3 3 Round Minimal None  ? Left PERRL 3 3 Round Minimal None  ? ?  ?  ? ? Visual Fields (Counting fingers)   ? ?   Left Right  ?  Full Full  ? ?  ?  ? ? Extraocular Movement   ? ?   Right Left  ?  Full Full  ? ?  ?  ? ? Neuro/Psych   ? ? Oriented x3: Yes  ? Mood/Affect: Normal  ? ?  ?  ? ? Dilation   ? ? Both eyes: 1.0% Mydriacyl, 2.5% Phenylephrine @ 9:55 AM  ? ?  ?  ? ?  ? ?Slit Lamp and Fundus Exam   ? ? Slit Lamp Exam   ? ?   Right Left  ? Lens Posterior chamber intraocular lens Posterior chamber intraocular lens  ? ?  ?  ? ?  ? ?Refraction   ? ? Wearing Rx   ? ? Age: 59 yr  ? ?  ?  ? ?  ? ? ?IMAGING AND PROCEDURES  ?Imaging and Procedures for 06/29/21 ? ?OCT, Retina - OU - Both Eyes   ? ?   ?Right Eye ?Quality was good. Scan locations included subfoveal. Central Foveal Thickness: 236. Progression has no prior data. Findings include normal foveal contour.  ? ?Left Eye ?Quality was good. Scan locations included subfoveal. Central Foveal Thickness: 321. Progression has no prior data. Findings include abnormal foveal contour, cystoid macular edema.  ? ?Notes ?OD with no active maculopathy ? ?OS with center involved old chronic cystoid changes likely residual CSME from the past consistent with good.  Acuity which still  remains ? ?  ? ?Color Fundus Photography Optos - OU - Both Eyes   ? ?   ?Right Eye ?Progression has no prior data. Disc findings include neovascularization. Macula : microaneurysms.  ? ?Left Eye ?Progress

## 2021-07-01 ENCOUNTER — Ambulatory Visit: Payer: 59 | Admitting: Podiatry

## 2021-07-06 ENCOUNTER — Ambulatory Visit (INDEPENDENT_AMBULATORY_CARE_PROVIDER_SITE_OTHER): Payer: 59 | Admitting: Podiatry

## 2021-07-06 DIAGNOSIS — E08621 Diabetes mellitus due to underlying condition with foot ulcer: Secondary | ICD-10-CM | POA: Diagnosis not present

## 2021-07-06 DIAGNOSIS — L97522 Non-pressure chronic ulcer of other part of left foot with fat layer exposed: Secondary | ICD-10-CM | POA: Diagnosis not present

## 2021-07-07 NOTE — Progress Notes (Signed)
?  Subjective:  ?Patient ID: Janet Mitchell, female    DOB: 21-Sep-1965,  MRN: [id number] ? ?Chief Complaint  ?Patient presents with  ? Diabetic Ulcer  ?  total contact casting/ wound care  ? ? ?56 y.o. female returns today with the above complaint. History confirmed with patient.  She received her neuropathic walker boot to offload the ulcer and is wearing an ? ? ? ?Objective:  ?Physical Exam: ?warm, good capillary refill, no trophic changes or ulcerative lesions, normal DP and PT pulses, and reduced sensation to monofilament. ?Left Foot:  Prior hallux amputation there is a full-thickness ulceration measuring 1.0 x 0.8 x 0.5 cm probes to subcutaneous tissue no exposed bone tendon or joint no purulence or malodor, no cellulitis ? ? ? ? ?Multiple view left foot radiographs taken today show prior partial first ray amputation, there is some heterotopic ossification, nothing significantly prominent ? ?ESR 57 CRP normal, CBC normal ?Assessment:  ? ?1. Diabetic ulcer of left foot associated with diabetes mellitus due to underlying condition, with fat layer exposed, unspecified part of foot ([place])   ? ? ? ?Plan:  ?Patient was evaluated and treated and all questions answered. ? ?Ulcer left foot ?-We discussed the etiology and factors that are a part of the wound healing process.  We also discussed the risk of infection both soft tissue and osteomyelitis from open ulceration.  Discussed the risk of limb loss if this happens or worsens. ?-Debridement as below. ?-Dressed with Iodosorb, DSD. ?-Continue collagen application and gel with defender boot to offload ? ?Procedure: Excisional Debridement of Wound ?Rationale: Removal of non-viable soft tissue from the wound to promote healing.  ?Anesthesia: none ?Post-Debridement Wound Measurements: 1.0 x 0.8 x 0.5 ?Type of Debridement: Sharp Excisional ?Tissue Removed: Non-viable soft tissue ?Depth of Debridement: subcutaneous tissue. ?Technique: Sharp excisional debridement to  bleeding, viable wound base.  ?Dressing: Dry, sterile, compression dressing. ?Disposition: Patient tolerated procedure well.  ? ?Return in about 3 weeks (around 07/27/2021) for wound care.  ? ? ?

## 2021-07-13 ENCOUNTER — Ambulatory Visit (INDEPENDENT_AMBULATORY_CARE_PROVIDER_SITE_OTHER): Payer: 59 | Admitting: Ophthalmology

## 2021-07-13 ENCOUNTER — Encounter (INDEPENDENT_AMBULATORY_CARE_PROVIDER_SITE_OTHER): Payer: Self-pay | Admitting: Ophthalmology

## 2021-07-13 DIAGNOSIS — E113591 Type 2 diabetes mellitus with proliferative diabetic retinopathy without macular edema, right eye: Secondary | ICD-10-CM

## 2021-07-13 NOTE — Progress Notes (Signed)
? ? ?07/13/2021 ? ?  ? ?CHIEF COMPLAINT ?Patient presents for  ?Chief Complaint  ?Patient presents with  ? Retina Follow Up  ? ? ? ? ?HISTORY OF PRESENT ILLNESS: ?Janet Mitchell is a 56 y.o. female who presents to the clinic today for:  ? ?HPI   ? ? Retina Follow Up   ? ?      ? Diagnosis: Diabetic Retinopathy  ? Laterality: right eye  ? Onset: 2 weeks ago  ? ?  ?  ? ? Comments   ?2 weeks color FP, Dilate OD, PRP. ?Patient states vision is stable and unchanged since last visit. Denies any new floaters or FOL. ?Pt states "I still have the floaty things." ? ?  ?  ?Last edited by Laurin Coder on 07/13/2021 11:05 AM.  ?  ? ? ?Referring physician: ?Lindell Spar, MD ?[address] ?[place],  [place] [address] ? ?HISTORICAL INFORMATION:  ? ?Selected notes from the [place] ?  ? ?Lab Results  ?Component Value Date  ? HGBA1C 6.8 04/28/2021  ?  ? ?CURRENT MEDICATIONS: ?No current outpatient medications on file. (Ophthalmic Drugs)  ? ?No current facility-administered medications for this visit. (Ophthalmic Drugs)  ? ?Current Outpatient Medications (Other)  ?Medication Sig  ? Accu-Chek Softclix Lancets lancets Please use it to check blood glucose twice daily and PRN.  ? acetaminophen (TYLENOL) 325 MG tablet Take 650 mg by mouth every 6 (six) hours as needed (pain).  ? aspirin EC 81 MG tablet Take 1 tablet (81 mg total) by mouth daily.  ? B Complex-C-Folic Acid (RENA-VITE PO) Take 1 tablet by mouth daily.  ? Blood Glucose Monitoring Suppl (ACCU-CHEK GUIDE) w/Device KIT 1 Piece by Does not apply route as directed.  ? cadexomer iodine (IODOSORB) 0.9 % gel Apply to left foot ulcer once daily.  ? calcitRIOL (ROCALTROL) 0.5 MCG capsule Take 1-2 capsules (0.5-1 mcg total) by mouth See admin instructions. 0.38mg on Mondays through Fridays and take 134m on Saturdays and Sundays  ? calcium acetate (PHOSLO) 667 MG capsule Take by mouth.  ? cetirizine (ZYRTEC) 10 MG tablet TAKE 1 TABLET BY MOUTH ONCE DAILY.  ?  cholestyramine (QUESTRAN) 4 g packet Take 0.5 packets (2 g total) by mouth daily.  ? glucose blood (ACCU-CHEK GUIDE) test strip Use as instructed  ? hydrALAZINE (APRESOLINE) 10 MG tablet Take 10 mg by mouth 2 (two) times daily.  ? labetalol (NORMODYNE) 200 MG tablet Take 1 tablet (200 mg total) by mouth 2 (two) times daily.  ? losartan (COZAAR) 100 MG tablet TAKE ONE TABLET BY MOUTH DAILY  ? psyllium (METAMUCIL) 58.6 % packet Take by mouth.  ? ?No current facility-administered medications for this visit. (Other)  ? ? ? ? ?REVIEW OF SYSTEMS: ? ? ? ?ALLERGIES ?No Known Allergies ? ?PAST MEDICAL HISTORY ?Past Medical History:  ?Diagnosis Date  ? Abdominal wall hematoma 12/26/2019  ? Anemia   ? Arthritis   ? [place]  ? pt states she has been cleared of heart failure/disease  ? Chronic cholecystitis with calculus   ? Chronic kidney disease   ? Phreesia 12/12/2019  ? COVID-19 03/26/2019  ? COVID-19 virus infection 03/2019  ? [facility]  ? Dialysis M-W-F  ? Headache   ? "a few/wk" (03/09/2018) - no longer having these  ? History of blood transfusion 10/2017  ? "low blood count" (03/09/2018)  ? Hypertension   ?  Seasonal allergies   ? Spinal headache   ? Type II diabetes mellitus ([place])   ? ?Past Surgical History:  ?Procedure Laterality Date  ? AMPUTATION TOE Left 2013  ? Great toe  ? AV FISTULA PLACEMENT Left 07/22/2019  ? Procedure: LEFT ARM BASILIC ARTERIOVENOUS (AV) FISTULA CREATION;  Surgeon: Rosetta Posner, MD;  Location: [place];  Service: Vascular;  Laterality: Left;  ? BASCILIC VEIN TRANSPOSITION Left 10/17/2019  ? Procedure: LEFT SECOND STAGE BASCILIC VEIN TRANSPOSITION;  Surgeon: Rosetta Posner, MD;  Location: [place];  Service: Vascular;  Laterality: Left;  ? BIOPSY  10/24/2018  ? Procedure: BIOPSY;  Surgeon: Daneil Dolin, MD;  [facility];  Service: Endoscopy;;  right and left colon  ? CATARACT EXTRACTION W/ INTRAOCULAR LENS IMPLANT Right    ? [place]; 1998  ? CHOLECYSTECTOMY N/A 03/09/2018  ? Procedure: LAPAROSCOPIC CHOLECYSTECTOMY WITH INTRAOPERATIVE CHOLANGIOGRAM ERAS PATHWAY;  Surgeon: Donnie Mesa, MD;  Location: [place];  Service: General;  Laterality: N/A;  ? COLONOSCOPY N/A 10/24/2018  ? Procedure: COLONOSCOPY;  Surgeon: Daneil Dolin, MD;  [facility];  Service: Endoscopy;  Laterality: N/A;  1:00pm  ? EYE SURGERY Left 05/15/2018  ? Removal of blood in the globe (due to DM)  ? FLEXIBLE SIGMOIDOSCOPY N/A 11/24/2017  ? Procedure: FLEXIBLE SIGMOIDOSCOPY;  Surgeon: Daneil Dolin, MD;  [facility];  Service: Endoscopy;  Laterality: N/A;  ? IR FLUORO GUIDE CV LINE RIGHT  07/17/2019  ? IR PARACENTESIS  07/09/2019  ? IR US GUIDE VASC ACCESS RIGHT  07/17/2019  ? LAPAROSCOPIC CHOLECYSTECTOMY  03/09/2018  ? PERITONEAL CATHETER INSERTION  2017  ? POLYPECTOMY  10/24/2018  ? Procedure: POLYPECTOMY;  Surgeon: Daneil Dolin, MD;  [facility];  Service: Endoscopy;;  ? TOTAL HIP ARTHROPLASTY Left 1997  ? ? ?FAMILY HISTORY ?Family History  ?Problem Relation Age of Onset  ? Heart disease Mother   ? Thrombocytopenia Mother   ?     TTP  ? Heart failure Father   ? Kidney disease Paternal Grandfather   ? Colon cancer Neg Hx   ? ? ?SOCIAL HISTORY ?Social History  ? ?Tobacco Use  ? Smoking status: Never  ?  Passive exposure: Yes  ? Smokeless tobacco: Never  ?Vaping Use  ? Vaping Use: Never used  ?Substance Use Topics  ? Alcohol use: Never  ? Drug use: Never  ? ?  ? ?  ? ?OPHTHALMIC EXAM: ? ?Base Eye Exam   ? ? Visual Acuity (ETDRS)   ? ?   Right Left  ? Dist cc 20/30 20/40 +2  ? Dist ph cc  NI  ? ? Correction: Glasses  ? ?  ?  ? ? Tonometry (Tonopen, 11:05 AM)   ? ?   Right Left  ? Pressure 12 10  ? ?  ?  ? ? Pupils   ? ?   Pupils Dark Light React APD  ? Right PERRL 3 3 Minimal None  ? Left PERRL 3 3 Minimal None  ? ?  ?  ? ? Extraocular Movement   ? ?   Right Left  ?  Full Full  ? ?  ?  ? ? Neuro/Psych   ? ? Oriented  x3: Yes  ? Mood/Affect: Normal  ? ?  ?  ? ? Dilation   ? ? Right eye: 1.0% Mydriacyl, 2.5% Phenylephrine @ 11:10 AM  ? ?  ?  ? ?  ? ?  Slit Lamp and Fundus Exam   ? ? Slit Lamp Exam   ? ?   Right Left  ? Lens Posterior chamber intraocular lens Posterior chamber intraocular lens  ? ?  ?  ? ?  ? ? ?IMAGING AND PROCEDURES  ?Imaging and Procedures for 07/13/21 ? ?Color Fundus Photography Optos - OU - Both Eyes   ? ?   ?Right Eye ?Progression has no prior data. Disc findings include neovascularization. Macula : microaneurysms.  ? ?Left Eye ?Progression has no prior data. Disc findings include normal observations, increased cup to disc ratio. Macula : microaneurysms.  ? ?Notes ?Active PDR OD with extensive white lines of nonperfusion retina superiorly and nasally.  Supra temporal with neovascularization elsewhere and small vitreous hemorrhage seen inferior vitreous cavity OD, with preretinal hemorrhage along the superotemporal arcade ? ?OS with excellent PRP, clear media and exudates in the posterior pole signifying quiescent PDR yet with evidence of residual maculopathy ? ?  ? ?Panretinal Photocoagulation - OD - Right Eye   ? ?   ?Time Out ?Confirmed correct patient, procedure, site, and patient consented.  ? ?Anesthesia ?Topical anesthesia was used. Anesthetic medications included Proparacaine 0.5%.  ? ?Laser Information ?The type of laser was diode. Color was yellow. The duration in seconds was 0.03. The spot size was 390 microns. Laser power was 260. Total spots was 748.  ? ?Post-op ?The patient tolerated the procedure well. There were no complications. The patient received written and verbal post procedure care education.  ? ?Notes ?Delivered superotemporally and superiorly OD, large region of nonperfusion ? ?Mild achiness discomfort waned immediately posttreatment ? ?  ? ? ?  ?  ? ?  ?ASSESSMENT/PLAN: ? ?No problem-specific Assessment & Plan notes found for this encounter. ?  ? ?  ICD-10-CM   ?1. Proliferative  diabetic retinopathy of right eye determined by examination ([place])  N98.9211 Color Fundus Photography Optos - OU - Both Eyes  ?  Panretinal Photocoagulation - OD - Right Eye  ?  ? ? ?1. ? ?2. ? ?3. ? ?Ophthal[facility] [place]

## 2021-07-29 ENCOUNTER — Ambulatory Visit (INDEPENDENT_AMBULATORY_CARE_PROVIDER_SITE_OTHER): Payer: 59 | Admitting: Podiatry

## 2021-07-29 DIAGNOSIS — E08621 Diabetes mellitus due to underlying condition with foot ulcer: Secondary | ICD-10-CM

## 2021-07-29 DIAGNOSIS — L97522 Non-pressure chronic ulcer of other part of left foot with fat layer exposed: Secondary | ICD-10-CM

## 2021-08-03 NOTE — Progress Notes (Signed)
?  Subjective:  ?Patient ID: Janet Mitchell, female    DOB: 1965/11/14,  MRN: [id number] ? ?Chief Complaint  ?Patient presents with  ? Diabetic Ulcer  ?  3 week follow up left foot  ? ? ?56 y.o. female returns today with the above complaint. History confirmed with patient.  She is still wearing the boot and using the Prisma as directed ? ? ? ?Objective:  ?Physical Exam: ?warm, good capillary refill, no trophic changes or ulcerative lesions, normal DP and PT pulses, and reduced sensation to monofilament. ?Left Foot:  Prior hallux amputation there is a full-thickness ulceration measuring 1.0 x 1.2x0.7 cm probes to subcutaneous tissue no exposed bone tendon or joint no purulence or malodor, no cellulitis ? ? ? ? ? ?Multiple view left foot radiographs taken today show prior partial first ray amputation, there is some heterotopic ossification, nothing significantly prominent ? ?ESR 57 CRP normal, CBC normal ?Assessment:  ? ?1. Diabetic ulcer of left foot associated with diabetes mellitus due to underlying condition, with fat layer exposed, unspecified part of foot ([place])   ? ? ? ?Plan:  ?Patient was evaluated and treated and all questions answered. ? ?Ulcer left foot ?-We discussed the etiology and factors that are a part of the wound healing process.  We also discussed the risk of infection both soft tissue and osteomyelitis from open ulceration.  Discussed the risk of limb loss if this happens or worsens. ?-Debridement as below. ?-Dressed with Prisma, DSD. ?-Continue Prisma application with defender boot to offload ?-Dimensions have not improved much over the last 2 visits.  I recommend we evaluate an MRI to assess the underlying bone for the possibility of osteomyelitis.  If negative would consider remodeling this with minimally invasive approach to offload the ulcer. ? ?Procedure: Excisional Debridement of Wound ?Rationale: Removal of non-viable soft tissue from the wound to promote healing.  ?Anesthesia:  none ?Post-Debridement Wound Measurements: 1.0 x x1.2x0.7 ?Type of Debridement: Sharp Excisional ?Tissue Removed: Non-viable soft tissue ?Depth of Debridement: subcutaneous tissue. ?Technique: Sharp excisional debridement to bleeding, viable wound base.  ?Dressing: Dry, sterile, compression dressing. ?Disposition: Patient tolerated procedure well.  ? ?No follow-ups on file.  ? ? ?

## 2021-08-14 ENCOUNTER — Ambulatory Visit
Admission: RE | Admit: 2021-08-14 | Discharge: 2021-08-14 | Disposition: A | Discharge status: Home / Self Care | Payer: 59 | Source: Ambulatory Visit | Attending: Podiatry | Admitting: Podiatry

## 2021-08-14 DIAGNOSIS — E08621 Diabetes mellitus due to underlying condition with foot ulcer: Secondary | ICD-10-CM

## 2021-08-14 IMAGING — MR MR FOOT*L* W/O CM
4 of 5 series · 19 of 40 positions shown · non-contrast
Comparison: Left foot x-rays dated [DATE].

CLINICAL DATA: Chronic diabetic foot ulcer near the first
metatarsal. History of prior great toe amputation in [IG].

EXAM:
MRI OF THE LEFT FOOT WITHOUT CONTRAST
TECHNIQUE: Multiplanar, multisequence MR imaging of the left forefoot was
performed. No intravenous contrast was administered.

[Series 5: T1 · coronal · 3.0mm · 0.25mm/px · 3 of 51 slices shown (1 of 2)]
[im 6/51]
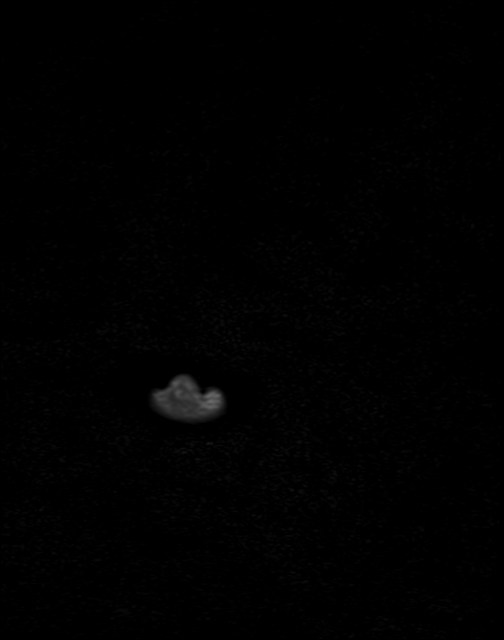
[im 28/51]
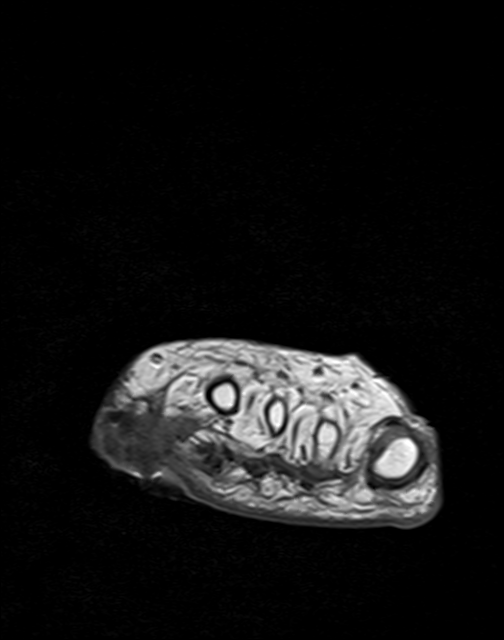
[im 45/51]
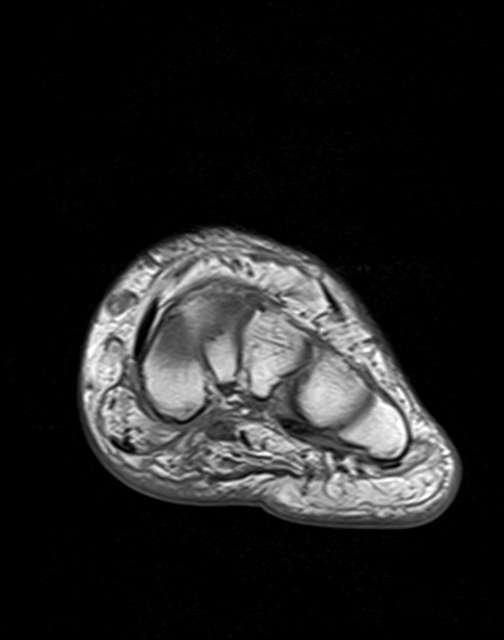

[Series 6: T2 fat-sat · coronal · 3.0mm · 0.25mm/px · 10 of 51 slices shown (1 of 2)]
[im 1/51]
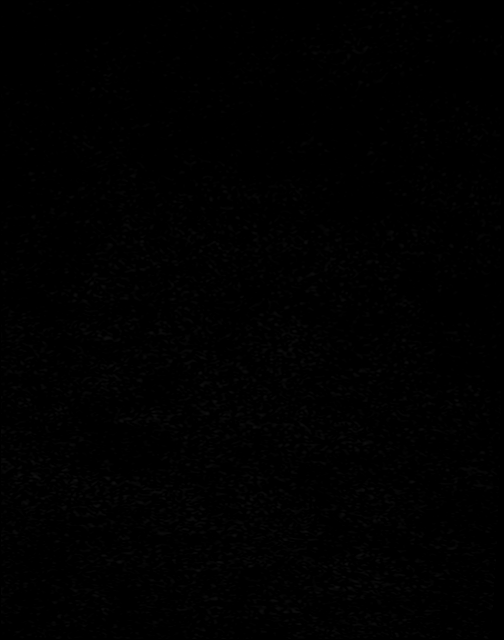
[im 6/51]
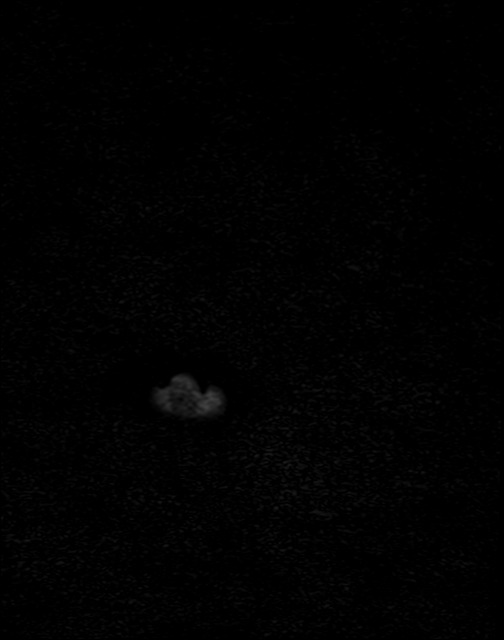
[im 11/51]
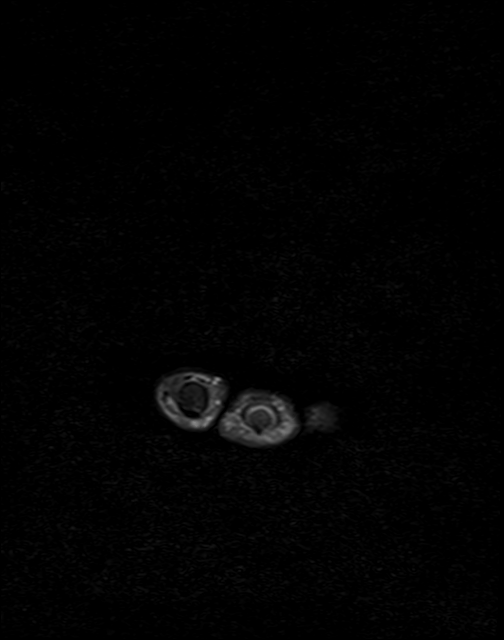
[im 16/51]
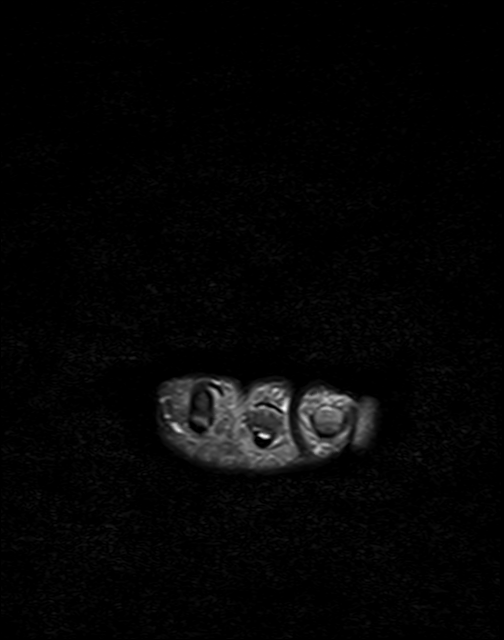
[im 21/51]
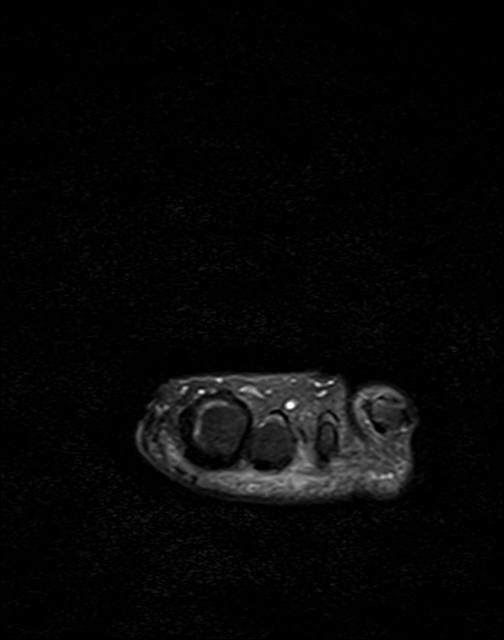
[im 26/51]
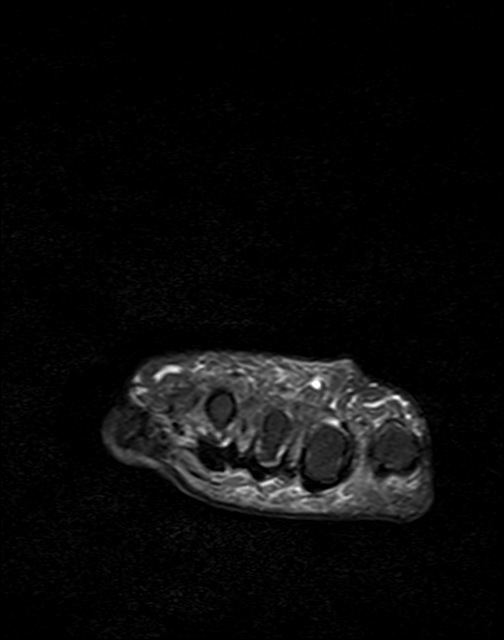
[im 31/51]
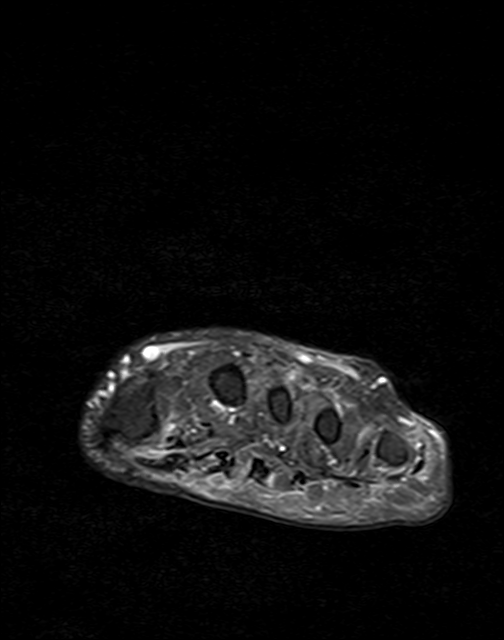
[im 36/51]
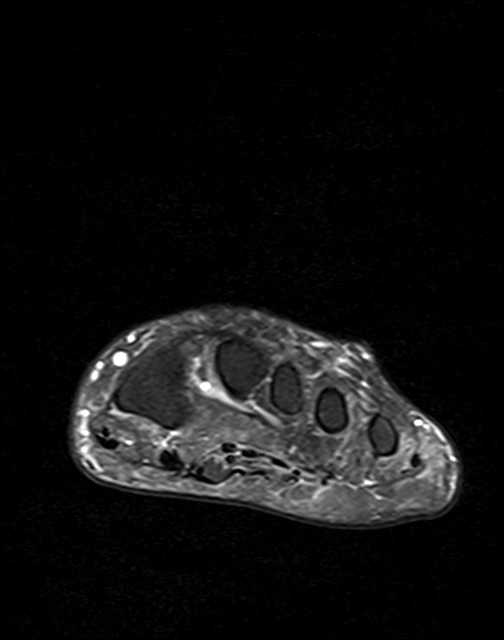
[im 41/51]
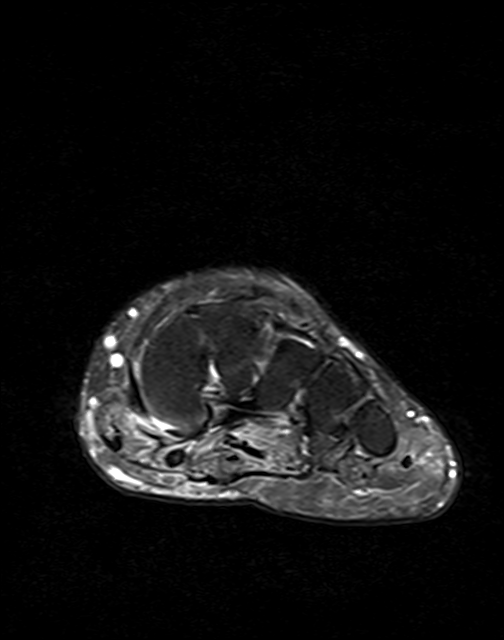
[im 46/51]
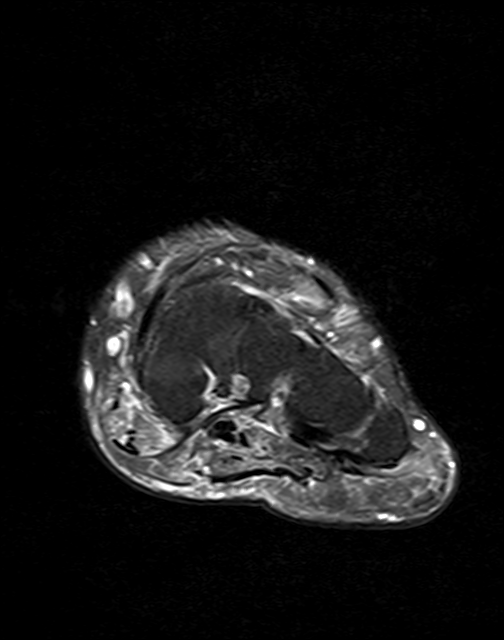

[Series 7: T2 fat-sat · axial · 3.0mm · 0.35mm/px · z∈[-31,+49]mm · 3 of 27 slices shown (2 of 2)]
[im 6/27]
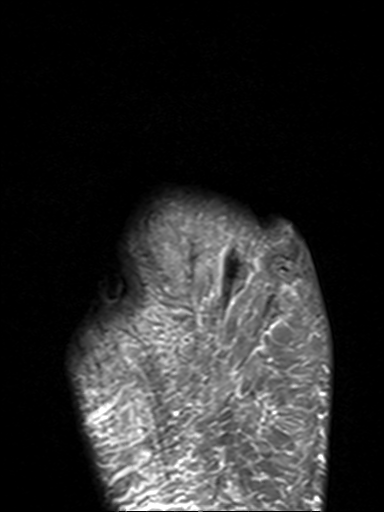
[im 16/27]
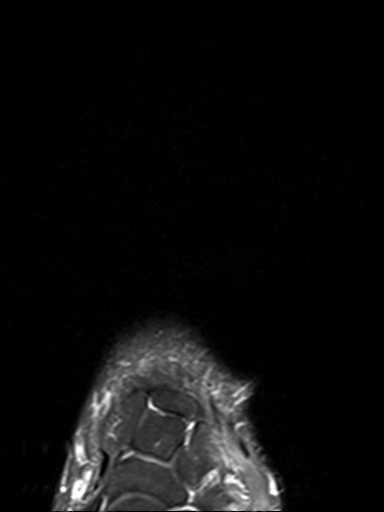
[im 27/27]
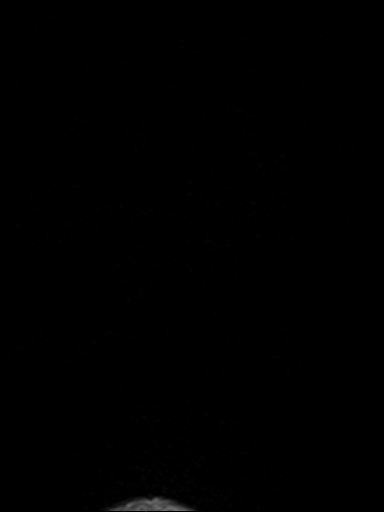

[Series 8: T1 · axial · 3.0mm · 0.35mm/px · z∈[-31,+49]mm · 3 of 27 slices shown (2 of 2)]
[im 6/27]
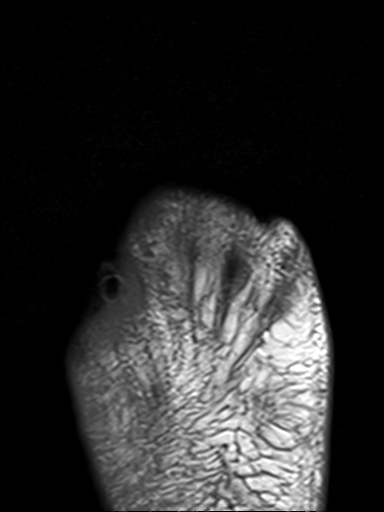
[im 16/27]
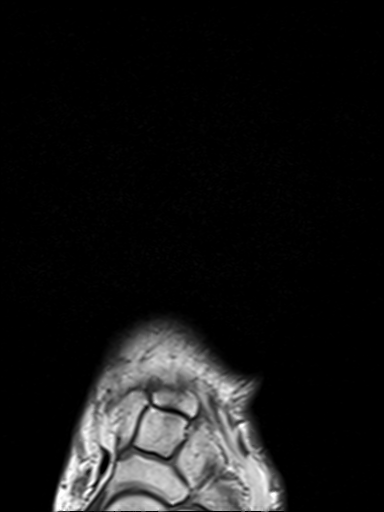
[im 27/27]
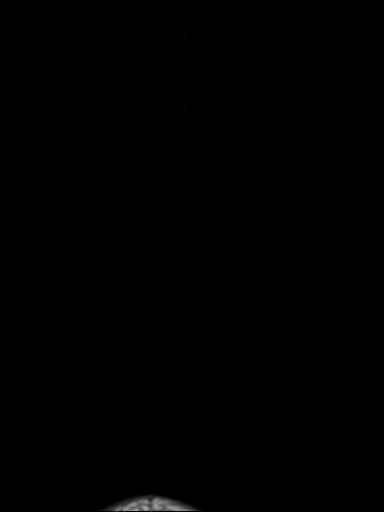

[19 of 40 positions shown; findings below may reference images not displayed]

FINDINGS: Bones/Joint/Cartilage

Prior first ray amputation to the mid first metatarsal. No
suspicious marrow signal abnormality. No fracture or dislocation.
Joint spaces are preserved. No joint effusion.

Ligaments

Collateral ligaments are intact.  Lisfranc ligament is intact.

Muscles and Tendons
Postsurgical changes of the first flexor and extensor tendons.
Remaining flexor and extensor tendons are intact. Complete fatty
atrophy of the intrinsic foot muscles.

Soft tissue
Plantar soft tissue ulceration near the tip of the residual first
metatarsal with few small foci subcutaneous gas and surrounding soft
tissue thickening. No fluid collection. No soft tissue mass.
IMPRESSION: 1. Prior first ray amputation with plantar soft tissue ulceration
near the tip of the residual first metatarsal. No osteomyelitis or
abscess.

## 2021-08-17 ENCOUNTER — Ambulatory Visit (INDEPENDENT_AMBULATORY_CARE_PROVIDER_SITE_OTHER): Payer: 59 | Admitting: Podiatry

## 2021-08-17 ENCOUNTER — Encounter (INDEPENDENT_AMBULATORY_CARE_PROVIDER_SITE_OTHER): Payer: 59 | Admitting: Ophthalmology

## 2021-08-17 DIAGNOSIS — M21962 Unspecified acquired deformity of left lower leg: Secondary | ICD-10-CM | POA: Diagnosis not present

## 2021-08-17 DIAGNOSIS — L97522 Non-pressure chronic ulcer of other part of left foot with fat layer exposed: Secondary | ICD-10-CM | POA: Diagnosis not present

## 2021-08-17 DIAGNOSIS — E08621 Diabetes mellitus due to underlying condition with foot ulcer: Secondary | ICD-10-CM

## 2021-08-19 MED ORDER — CIPROFLOXACIN HCL 500 MG PO TABS: 500.0000 mg | ORAL_TABLET | Freq: Two times a day (BID) | ORAL | 0 refills | Status: DC

## 2021-08-19 NOTE — Progress Notes (Signed)
Subjective:  Patient ID: Janet Mitchell, female    DOB: 1966-01-15,  MRN: [id number]  Chief Complaint  Patient presents with   Diabetic Ulcer    56 y.o. female returns today with the above complaint. History confirmed with patient.  She is still wearing the boot and using the Prisma as directed, she completed the MRI    Objective:  Physical Exam: warm, good capillary refill, no trophic changes or ulcerative lesions, normal DP and PT pulses, and reduced sensation to monofilament. Left Foot:  Prior hallux amputation there is a full-thickness ulceration measuring 0.6 x 0.8 x 0.5 cm probes to subcutaneous tissue no exposed bone tendon or joint no purulence or malodor, no cellulitis       Multiple view left foot radiographs show prior partial first ray amputation, there is some heterotopic ossification, nothing significantly prominent  ESR 57 CRP normal, CBC normal   Study Result  Narrative & Impression  CLINICAL DATA:  Chronic diabetic foot ulcer near the first metatarsal. History of prior great toe amputation in 2013.   EXAM: MRI OF THE LEFT FOOT WITHOUT CONTRAST   TECHNIQUE: Multiplanar, multisequence MR imaging of the left forefoot was performed. No intravenous contrast was administered.   COMPARISON:  Left foot x-rays dated February 09, 2021.   FINDINGS: Bones/Joint/Cartilage   Prior first ray amputation to the mid first metatarsal. No suspicious marrow signal abnormality. No fracture or dislocation. Joint spaces are preserved. No joint effusion.   Ligaments   Collateral ligaments are intact.  Lisfranc ligament is intact.   Muscles and Tendons Postsurgical changes of the first flexor and extensor tendons. Remaining flexor and extensor tendons are intact. Complete fatty atrophy of the intrinsic foot muscles.   Soft tissue Plantar soft tissue ulceration near the tip of the residual first metatarsal with few small foci subcutaneous gas and surrounding  soft tissue thickening. No fluid collection. No soft tissue mass.   IMPRESSION: 1. Prior first ray amputation with plantar soft tissue ulceration near the tip of the residual first metatarsal. No osteomyelitis or abscess.     Electronically Signed   By: Titus Dubin M.D.   On: 08/16/2021 10:34   Assessment:   1. Diabetic ulcer of left foot associated with diabetes mellitus due to underlying condition, with fat layer exposed, unspecified part of foot ([place])   2. Acquired deformity of foot, left   3. Deformity of metatarsal bone of left foot      Plan:  Patient was evaluated and treated and all questions answered.  Ulcer left foot -We discussed the etiology and factors that are a part of the wound healing process.  We also discussed the risk of infection both soft tissue and osteomyelitis from open ulceration.  Discussed the risk of limb loss if this happens or worsens. -Debridement as below. -Dressed with Prisma, DSD. -Continue Prisma application with defender boot to offload -MRI was reviewed.  No signs of osteomyelitis.  I discussed with her the option of partial excision of the wound and the underlying first metatarsal.  We discussed the risk benefits and potential complications of this including but not limited to  pain, swelling, infection, scar, numbness which may be temporary or permanent, chronic pain, stiffness, nerve pain or damage, wound healing problems.  She understands and wishes to proceed.  Informed consent was signed and reviewed.  We also discussed the postoperative protocol and she would likely need to stay off of this for a few weeks if we are able to  excise the wound completely.   Procedure: Excisional Debridement of Wound Rationale: Removal of non-viable soft tissue from the wound to promote healing.  Anesthesia: none Post-Debridement Wound Measurements: 0.6 x 0.8 x 0.5 Type of Debridement: Sharp Excisional Tissue Removed: Non-viable soft tissue Depth of  Debridement: subcutaneous tissue. Technique: Sharp excisional debridement to bleeding, viable wound base.  Dressing: Dry, sterile, compression dressing. Disposition: Patient tolerated procedure well.   Return in about 3 weeks (around 09/07/2021) for wound care.

## 2021-08-19 NOTE — Addendum Note (Signed)
Addended bySherryle Lis, Gemma Ruan R on: 08/19/2021 07:45 PM   Modules accepted: Orders

## 2021-08-20 LAB — WOUND CULTURE
MICRO NUMBER:: 13433765
SPECIMEN QUALITY:: ADEQUATE

## 2021-08-23 ENCOUNTER — Inpatient Hospital Stay (HOSPITAL_COMMUNITY): Payer: 59

## 2021-08-23 ENCOUNTER — Encounter (HOSPITAL_COMMUNITY): Payer: Self-pay | Admitting: Student in an Organized Health Care Education/Training Program

## 2021-08-23 ENCOUNTER — Emergency Department (HOSPITAL_COMMUNITY): Payer: 59

## 2021-08-23 ENCOUNTER — Inpatient Hospital Stay (HOSPITAL_COMMUNITY)
Admission: EM | Admit: 2021-08-23 | Discharge: 2021-10-05 | DRG: 003 | Disposition: A | Discharge status: Skilled Nursing Facility | Payer: 59 | Attending: Family Medicine | Admitting: Family Medicine

## 2021-08-23 DIAGNOSIS — J189 Pneumonia, unspecified organism: Secondary | ICD-10-CM | POA: Diagnosis not present

## 2021-08-23 DIAGNOSIS — Z992 Dependence on renal dialysis: Secondary | ICD-10-CM

## 2021-08-23 DIAGNOSIS — I674 Hypertensive encephalopathy: Secondary | ICD-10-CM | POA: Diagnosis present

## 2021-08-23 DIAGNOSIS — E1140 Type 2 diabetes mellitus with diabetic neuropathy, unspecified: Secondary | ICD-10-CM | POA: Diagnosis present

## 2021-08-23 DIAGNOSIS — R000 Tachycardia, unspecified: Secondary | ICD-10-CM | POA: Diagnosis not present

## 2021-08-23 DIAGNOSIS — G911 Obstructive hydrocephalus: Secondary | ICD-10-CM

## 2021-08-23 DIAGNOSIS — E1122 Type 2 diabetes mellitus with diabetic chronic kidney disease: Secondary | ICD-10-CM | POA: Diagnosis present

## 2021-08-23 DIAGNOSIS — I10 Essential (primary) hypertension: Secondary | ICD-10-CM

## 2021-08-23 DIAGNOSIS — E11621 Type 2 diabetes mellitus with foot ulcer: Secondary | ICD-10-CM

## 2021-08-23 DIAGNOSIS — Z72820 Sleep deprivation: Secondary | ICD-10-CM

## 2021-08-23 DIAGNOSIS — I161 Hypertensive emergency: Secondary | ICD-10-CM | POA: Diagnosis not present

## 2021-08-23 DIAGNOSIS — G935 Compression of brain: Secondary | ICD-10-CM | POA: Diagnosis not present

## 2021-08-23 DIAGNOSIS — J690 Pneumonitis due to inhalation of food and vomit: Secondary | ICD-10-CM | POA: Diagnosis not present

## 2021-08-23 DIAGNOSIS — L97509 Non-pressure chronic ulcer of other part of unspecified foot with unspecified severity: Secondary | ICD-10-CM | POA: Diagnosis present

## 2021-08-23 DIAGNOSIS — T85890A Other specified complication of nervous system prosthetic devices, implants and grafts, initial encounter: Secondary | ICD-10-CM | POA: Diagnosis not present

## 2021-08-23 DIAGNOSIS — Y95 Nosocomial condition: Secondary | ICD-10-CM | POA: Diagnosis not present

## 2021-08-23 DIAGNOSIS — Y829 Unspecified medical devices associated with adverse incidents: Secondary | ICD-10-CM | POA: Diagnosis not present

## 2021-08-23 DIAGNOSIS — L97529 Non-pressure chronic ulcer of other part of left foot with unspecified severity: Secondary | ICD-10-CM | POA: Diagnosis not present

## 2021-08-23 DIAGNOSIS — K660 Peritoneal adhesions (postprocedural) (postinfection): Secondary | ICD-10-CM | POA: Diagnosis present

## 2021-08-23 DIAGNOSIS — I615 Nontraumatic intracerebral hemorrhage, intraventricular: Principal | ICD-10-CM | POA: Diagnosis present

## 2021-08-23 DIAGNOSIS — Z20822 Contact with and (suspected) exposure to covid-19: Secondary | ICD-10-CM | POA: Diagnosis present

## 2021-08-23 DIAGNOSIS — G931 Anoxic brain damage, not elsewhere classified: Secondary | ICD-10-CM | POA: Diagnosis present

## 2021-08-23 DIAGNOSIS — Z6841 Body Mass Index (BMI) 40.0 and over, adult: Secondary | ICD-10-CM

## 2021-08-23 DIAGNOSIS — J95851 Ventilator associated pneumonia: Secondary | ICD-10-CM | POA: Diagnosis not present

## 2021-08-23 DIAGNOSIS — G8194 Hemiplegia, unspecified affecting left nondominant side: Secondary | ICD-10-CM | POA: Diagnosis present

## 2021-08-23 DIAGNOSIS — I619 Nontraumatic intracerebral hemorrhage, unspecified: Secondary | ICD-10-CM | POA: Diagnosis present

## 2021-08-23 DIAGNOSIS — T17998A Other foreign object in respiratory tract, part unspecified causing other injury, initial encounter: Secondary | ICD-10-CM | POA: Diagnosis not present

## 2021-08-23 DIAGNOSIS — I5032 Chronic diastolic (congestive) heart failure: Secondary | ICD-10-CM | POA: Diagnosis present

## 2021-08-23 DIAGNOSIS — D631 Anemia in chronic kidney disease: Secondary | ICD-10-CM | POA: Diagnosis present

## 2021-08-23 DIAGNOSIS — L089 Local infection of the skin and subcutaneous tissue, unspecified: Secondary | ICD-10-CM | POA: Diagnosis present

## 2021-08-23 DIAGNOSIS — G936 Cerebral edema: Secondary | ICD-10-CM | POA: Diagnosis present

## 2021-08-23 DIAGNOSIS — E11649 Type 2 diabetes mellitus with hypoglycemia without coma: Secondary | ICD-10-CM | POA: Diagnosis not present

## 2021-08-23 DIAGNOSIS — Z7982 Long term (current) use of aspirin: Secondary | ICD-10-CM

## 2021-08-23 DIAGNOSIS — B965 Pseudomonas (aeruginosa) (mallei) (pseudomallei) as the cause of diseases classified elsewhere: Secondary | ICD-10-CM | POA: Diagnosis present

## 2021-08-23 DIAGNOSIS — G929 Unspecified toxic encephalopathy: Secondary | ICD-10-CM | POA: Diagnosis present

## 2021-08-23 DIAGNOSIS — R29731 NIHSS score 31: Secondary | ICD-10-CM | POA: Diagnosis present

## 2021-08-23 DIAGNOSIS — R251 Tremor, unspecified: Secondary | ICD-10-CM | POA: Diagnosis not present

## 2021-08-23 DIAGNOSIS — E119 Type 2 diabetes mellitus without complications: Secondary | ICD-10-CM | POA: Diagnosis not present

## 2021-08-23 DIAGNOSIS — R1110 Vomiting, unspecified: Secondary | ICD-10-CM | POA: Diagnosis not present

## 2021-08-23 DIAGNOSIS — Z96642 Presence of left artificial hip joint: Secondary | ICD-10-CM | POA: Diagnosis present

## 2021-08-23 DIAGNOSIS — I69191 Dysphagia following nontraumatic intracerebral hemorrhage: Secondary | ICD-10-CM

## 2021-08-23 DIAGNOSIS — Z9911 Dependence on respirator [ventilator] status: Secondary | ICD-10-CM

## 2021-08-23 DIAGNOSIS — D696 Thrombocytopenia, unspecified: Secondary | ICD-10-CM

## 2021-08-23 DIAGNOSIS — E875 Hyperkalemia: Secondary | ICD-10-CM | POA: Diagnosis not present

## 2021-08-23 DIAGNOSIS — D649 Anemia, unspecified: Secondary | ICD-10-CM | POA: Diagnosis not present

## 2021-08-23 DIAGNOSIS — J1520 Pneumonia due to staphylococcus, unspecified: Secondary | ICD-10-CM | POA: Diagnosis not present

## 2021-08-23 DIAGNOSIS — J9690 Respiratory failure, unspecified, unspecified whether with hypoxia or hypercapnia: Secondary | ICD-10-CM

## 2021-08-23 DIAGNOSIS — J156 Pneumonia due to other aerobic Gram-negative bacteria: Secondary | ICD-10-CM | POA: Diagnosis not present

## 2021-08-23 DIAGNOSIS — Z930 Tracheostomy status: Secondary | ICD-10-CM | POA: Diagnosis not present

## 2021-08-23 DIAGNOSIS — I132 Hypertensive heart and chronic kidney disease with heart failure and with stage 5 chronic kidney disease, or end stage renal disease: Secondary | ICD-10-CM | POA: Diagnosis present

## 2021-08-23 DIAGNOSIS — I610 Nontraumatic intracerebral hemorrhage in hemisphere, subcortical: Secondary | ICD-10-CM | POA: Diagnosis not present

## 2021-08-23 DIAGNOSIS — I4891 Unspecified atrial fibrillation: Secondary | ICD-10-CM | POA: Diagnosis present

## 2021-08-23 DIAGNOSIS — Z8249 Family history of ischemic heart disease and other diseases of the circulatory system: Secondary | ICD-10-CM

## 2021-08-23 DIAGNOSIS — N186 End stage renal disease: Secondary | ICD-10-CM

## 2021-08-23 DIAGNOSIS — Z8616 Personal history of COVID-19: Secondary | ICD-10-CM | POA: Diagnosis not present

## 2021-08-23 DIAGNOSIS — R4182 Altered mental status, unspecified: Secondary | ICD-10-CM | POA: Diagnosis not present

## 2021-08-23 DIAGNOSIS — Z79899 Other long term (current) drug therapy: Secondary | ICD-10-CM

## 2021-08-23 DIAGNOSIS — E861 Hypovolemia: Secondary | ICD-10-CM | POA: Diagnosis not present

## 2021-08-23 DIAGNOSIS — J9601 Acute respiratory failure with hypoxia: Secondary | ICD-10-CM | POA: Diagnosis present

## 2021-08-23 DIAGNOSIS — Z9889 Other specified postprocedural states: Secondary | ICD-10-CM

## 2021-08-23 DIAGNOSIS — I700 Atherosclerosis of aorta: Secondary | ICD-10-CM | POA: Diagnosis present

## 2021-08-23 DIAGNOSIS — E871 Hypo-osmolality and hyponatremia: Secondary | ICD-10-CM | POA: Diagnosis not present

## 2021-08-23 DIAGNOSIS — I629 Nontraumatic intracranial hemorrhage, unspecified: Secondary | ICD-10-CM | POA: Diagnosis not present

## 2021-08-23 DIAGNOSIS — R509 Fever, unspecified: Secondary | ICD-10-CM | POA: Diagnosis not present

## 2021-08-23 DIAGNOSIS — E1165 Type 2 diabetes mellitus with hyperglycemia: Secondary | ICD-10-CM | POA: Diagnosis present

## 2021-08-23 DIAGNOSIS — I959 Hypotension, unspecified: Secondary | ICD-10-CM | POA: Diagnosis not present

## 2021-08-23 DIAGNOSIS — G919 Hydrocephalus, unspecified: Secondary | ICD-10-CM | POA: Diagnosis not present

## 2021-08-23 DIAGNOSIS — R1310 Dysphagia, unspecified: Secondary | ICD-10-CM

## 2021-08-23 DIAGNOSIS — R4781 Slurred speech: Secondary | ICD-10-CM | POA: Diagnosis not present

## 2021-08-23 DIAGNOSIS — J9600 Acute respiratory failure, unspecified whether with hypoxia or hypercapnia: Secondary | ICD-10-CM

## 2021-08-23 DIAGNOSIS — I618 Other nontraumatic intracerebral hemorrhage: Secondary | ICD-10-CM | POA: Diagnosis present

## 2021-08-23 DIAGNOSIS — Z975 Presence of (intrauterine) contraceptive device: Secondary | ICD-10-CM

## 2021-08-23 DIAGNOSIS — E1169 Type 2 diabetes mellitus with other specified complication: Secondary | ICD-10-CM

## 2021-08-23 DIAGNOSIS — E870 Hyperosmolality and hypernatremia: Secondary | ICD-10-CM | POA: Diagnosis not present

## 2021-08-23 DIAGNOSIS — D509 Iron deficiency anemia, unspecified: Secondary | ICD-10-CM | POA: Diagnosis present

## 2021-08-23 DIAGNOSIS — A4901 Methicillin susceptible Staphylococcus aureus infection, unspecified site: Secondary | ICD-10-CM | POA: Diagnosis not present

## 2021-08-23 DIAGNOSIS — J9502 Infection of tracheostomy stoma: Secondary | ICD-10-CM | POA: Diagnosis not present

## 2021-08-23 DIAGNOSIS — Z89412 Acquired absence of left great toe: Secondary | ICD-10-CM

## 2021-08-23 DIAGNOSIS — I953 Hypotension of hemodialysis: Secondary | ICD-10-CM | POA: Diagnosis not present

## 2021-08-23 DIAGNOSIS — J9602 Acute respiratory failure with hypercapnia: Secondary | ICD-10-CM | POA: Diagnosis not present

## 2021-08-23 DIAGNOSIS — N179 Acute kidney failure, unspecified: Secondary | ICD-10-CM | POA: Diagnosis not present

## 2021-08-23 DIAGNOSIS — I639 Cerebral infarction, unspecified: Secondary | ICD-10-CM | POA: Diagnosis not present

## 2021-08-23 DIAGNOSIS — I6389 Other cerebral infarction: Secondary | ICD-10-CM | POA: Diagnosis not present

## 2021-08-23 LAB — I-STAT CHEM 8, ED
BUN: 74 mg/dL — ABNORMAL HIGH (ref 6–20)
Calcium, Ion: 1.03 mmol/L — ABNORMAL LOW (ref 1.15–1.40)
Chloride: 99 mmol/L (ref 98–111)
Creatinine, Ser: 10.6 mg/dL — ABNORMAL HIGH (ref 0.44–1.00)
Glucose, Bld: 129 mg/dL — ABNORMAL HIGH (ref 70–99)
HCT: 38 % (ref 36.0–46.0)
Hemoglobin: 12.9 g/dL (ref 12.0–15.0)
Potassium: 5.3 mmol/L — ABNORMAL HIGH (ref 3.5–5.1)
Sodium: 140 mmol/L (ref 135–145)
TCO2: 34 mmol/L — ABNORMAL HIGH (ref 22–32)

## 2021-08-23 LAB — ECHOCARDIOGRAM COMPLETE
Height: 67 in
S' Lateral: 3.1 cm
Weight: 3686.09 oz

## 2021-08-23 LAB — POCT I-STAT 7, (LYTES, BLD GAS, ICA,H+H)
Acid-Base Excess: 6 mmol/L — ABNORMAL HIGH (ref 0.0–2.0)
Bicarbonate: 31.3 mmol/L — ABNORMAL HIGH (ref 20.0–28.0)
Calcium, Ion: 1.07 mmol/L — ABNORMAL LOW (ref 1.15–1.40)
HCT: 37 % (ref 36.0–46.0)
Hemoglobin: 12.6 g/dL (ref 12.0–15.0)
O2 Saturation: 97 %
Potassium: 5.7 mmol/L — ABNORMAL HIGH (ref 3.5–5.1)
Sodium: 137 mmol/L (ref 135–145)
TCO2: 33 mmol/L — ABNORMAL HIGH (ref 22–32)
pCO2 arterial: 48.1 mmHg — ABNORMAL HIGH (ref 32–48)
pH, Arterial: 7.421 (ref 7.35–7.45)
pO2, Arterial: 95 mmHg (ref 83–108)

## 2021-08-23 LAB — COMPREHENSIVE METABOLIC PANEL
ALT: 15 U/L (ref 0–44)
AST: 19 U/L (ref 15–41)
Albumin: 3.8 g/dL (ref 3.5–5.0)
Alkaline Phosphatase: 93 U/L (ref 38–126)
Anion gap: 11 (ref 5–15)
BUN: 76 mg/dL — ABNORMAL HIGH (ref 6–20)
CO2: 29 mmol/L (ref 22–32)
Calcium: 8.5 mg/dL — ABNORMAL LOW (ref 8.9–10.3)
Chloride: 99 mmol/L (ref 98–111)
Creatinine, Ser: 9.25 mg/dL — ABNORMAL HIGH (ref 0.44–1.00)
GFR, Estimated: 5 mL/min — ABNORMAL LOW (ref >60)
Glucose, Bld: 131 mg/dL — ABNORMAL HIGH (ref 70–99)
Potassium: 5.2 mmol/L — ABNORMAL HIGH (ref 3.5–5.1)
Sodium: 139 mmol/L (ref 135–145)
Total Bilirubin: 0.1 mg/dL — ABNORMAL LOW (ref 0.3–1.2)
Total Protein: 7.5 g/dL (ref 6.5–8.1)

## 2021-08-23 LAB — LIPID PANEL
Cholesterol: 178 mg/dL (ref 0–200)
HDL: 63 mg/dL (ref >40)
LDL Cholesterol: 38 mg/dL (ref 0–99)
Total CHOL/HDL Ratio: 2.8 RATIO
Triglycerides: 383 mg/dL — ABNORMAL HIGH (ref <150)
VLDL: 77 mg/dL — ABNORMAL HIGH (ref 0–40)

## 2021-08-23 LAB — DIFFERENTIAL
Abs Immature Granulocytes: 0.01 10*3/uL (ref 0.00–0.07)
Basophils Absolute: 0 10*3/uL (ref 0.0–0.1)
Basophils Relative: 1 %
Eosinophils Absolute: 0.3 10*3/uL (ref 0.0–0.5)
Eosinophils Relative: 7 %
Immature Granulocytes: 0 %
Lymphocytes Relative: 28 %
Lymphs Abs: 1.1 10*3/uL (ref 0.7–4.0)
Monocytes Absolute: 0.4 10*3/uL (ref 0.1–1.0)
Monocytes Relative: 10 %
Neutro Abs: 2.2 10*3/uL (ref 1.7–7.7)
Neutrophils Relative %: 54 %

## 2021-08-23 LAB — GLUCOSE, CAPILLARY
Glucose-Capillary: 138 mg/dL — ABNORMAL HIGH (ref 70–99)
Glucose-Capillary: 153 mg/dL — ABNORMAL HIGH (ref 70–99)
Glucose-Capillary: 156 mg/dL — ABNORMAL HIGH (ref 70–99)
Glucose-Capillary: 156 mg/dL — ABNORMAL HIGH (ref 70–99)
Glucose-Capillary: 159 mg/dL — ABNORMAL HIGH (ref 70–99)
Glucose-Capillary: 162 mg/dL — ABNORMAL HIGH (ref 70–99)

## 2021-08-23 LAB — CBC
HCT: 35.8 % — ABNORMAL LOW (ref 36.0–46.0)
Hemoglobin: 10.9 g/dL — ABNORMAL LOW (ref 12.0–15.0)
MCH: 24.7 pg — ABNORMAL LOW (ref 26.0–34.0)
MCHC: 30.4 g/dL (ref 30.0–36.0)
MCV: 81 fL (ref 80.0–100.0)
Platelets: 90 10*3/uL — ABNORMAL LOW (ref 150–400)
RBC: 4.42 MIL/uL (ref 3.87–5.11)
RDW: 17.7 % — ABNORMAL HIGH (ref 11.5–15.5)
WBC: 4 10*3/uL (ref 4.0–10.5)
nRBC: 0 % (ref 0.0–0.2)

## 2021-08-23 LAB — CBG MONITORING, ED: Glucose-Capillary: 150 mg/dL — ABNORMAL HIGH (ref 70–99)

## 2021-08-23 LAB — SODIUM
Sodium: 139 mmol/L (ref 135–145)
Sodium: 144 mmol/L (ref 135–145)

## 2021-08-23 LAB — PROTIME-INR
INR: 1.2 (ref 0.8–1.2)
Prothrombin Time: 14.7 seconds (ref 11.4–15.2)

## 2021-08-23 LAB — RESP PANEL BY RT-PCR (FLU A&B, COVID) ARPGX2
Influenza A by PCR: NEGATIVE
Influenza B by PCR: NEGATIVE
SARS Coronavirus 2 by RT PCR: NEGATIVE

## 2021-08-23 LAB — HEMOGLOBIN A1C
Hgb A1c MFr Bld: 6.2 % — ABNORMAL HIGH (ref 4.8–5.6)
Mean Plasma Glucose: 131.24 mg/dL

## 2021-08-23 LAB — APTT: aPTT: 35 seconds (ref 24–36)

## 2021-08-23 LAB — PHOSPHORUS: Phosphorus: 5.3 mg/dL — ABNORMAL HIGH (ref 2.5–4.6)

## 2021-08-23 LAB — ETHANOL: Alcohol, Ethyl (B): <10 mg/dL (ref <10)

## 2021-08-23 LAB — MRSA NEXT GEN BY PCR, NASAL: MRSA by PCR Next Gen: NOT DETECTED

## 2021-08-23 LAB — HIV ANTIBODY (ROUTINE TESTING W REFLEX): HIV Screen 4th Generation wRfx: NONREACTIVE

## 2021-08-23 IMAGING — DX DG ABD PORTABLE 1V
1 series · 2 of 2 positions shown · non-contrast
Comparison: CT, [DATE].

CLINICAL DATA: NG tube placement.

EXAM:
PORTABLE ABDOMEN - 1 VIEW

[Series 1: abdomen · 0.14mm/px · 2 of 2 slices shown]
[im 1/2]
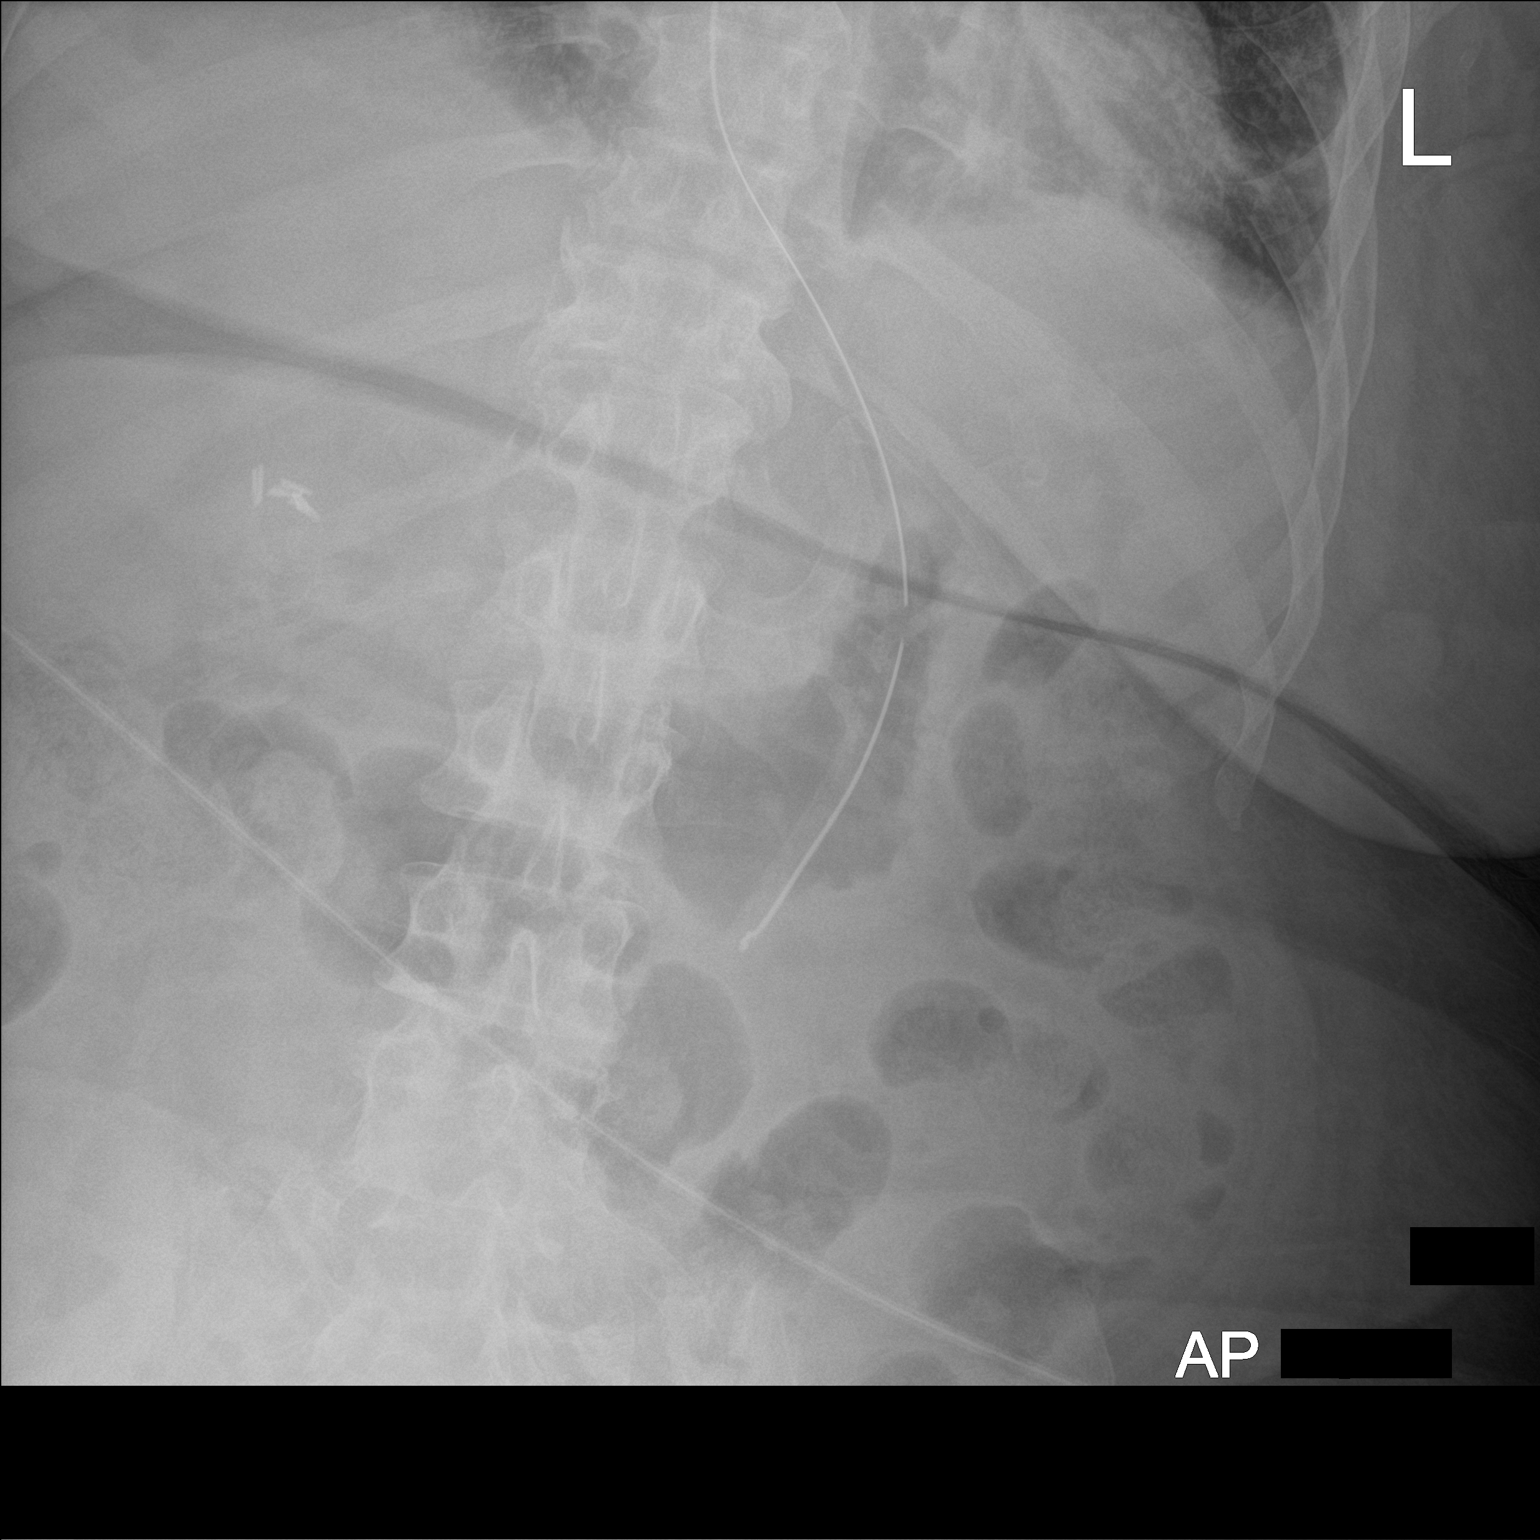
[im 2/2]
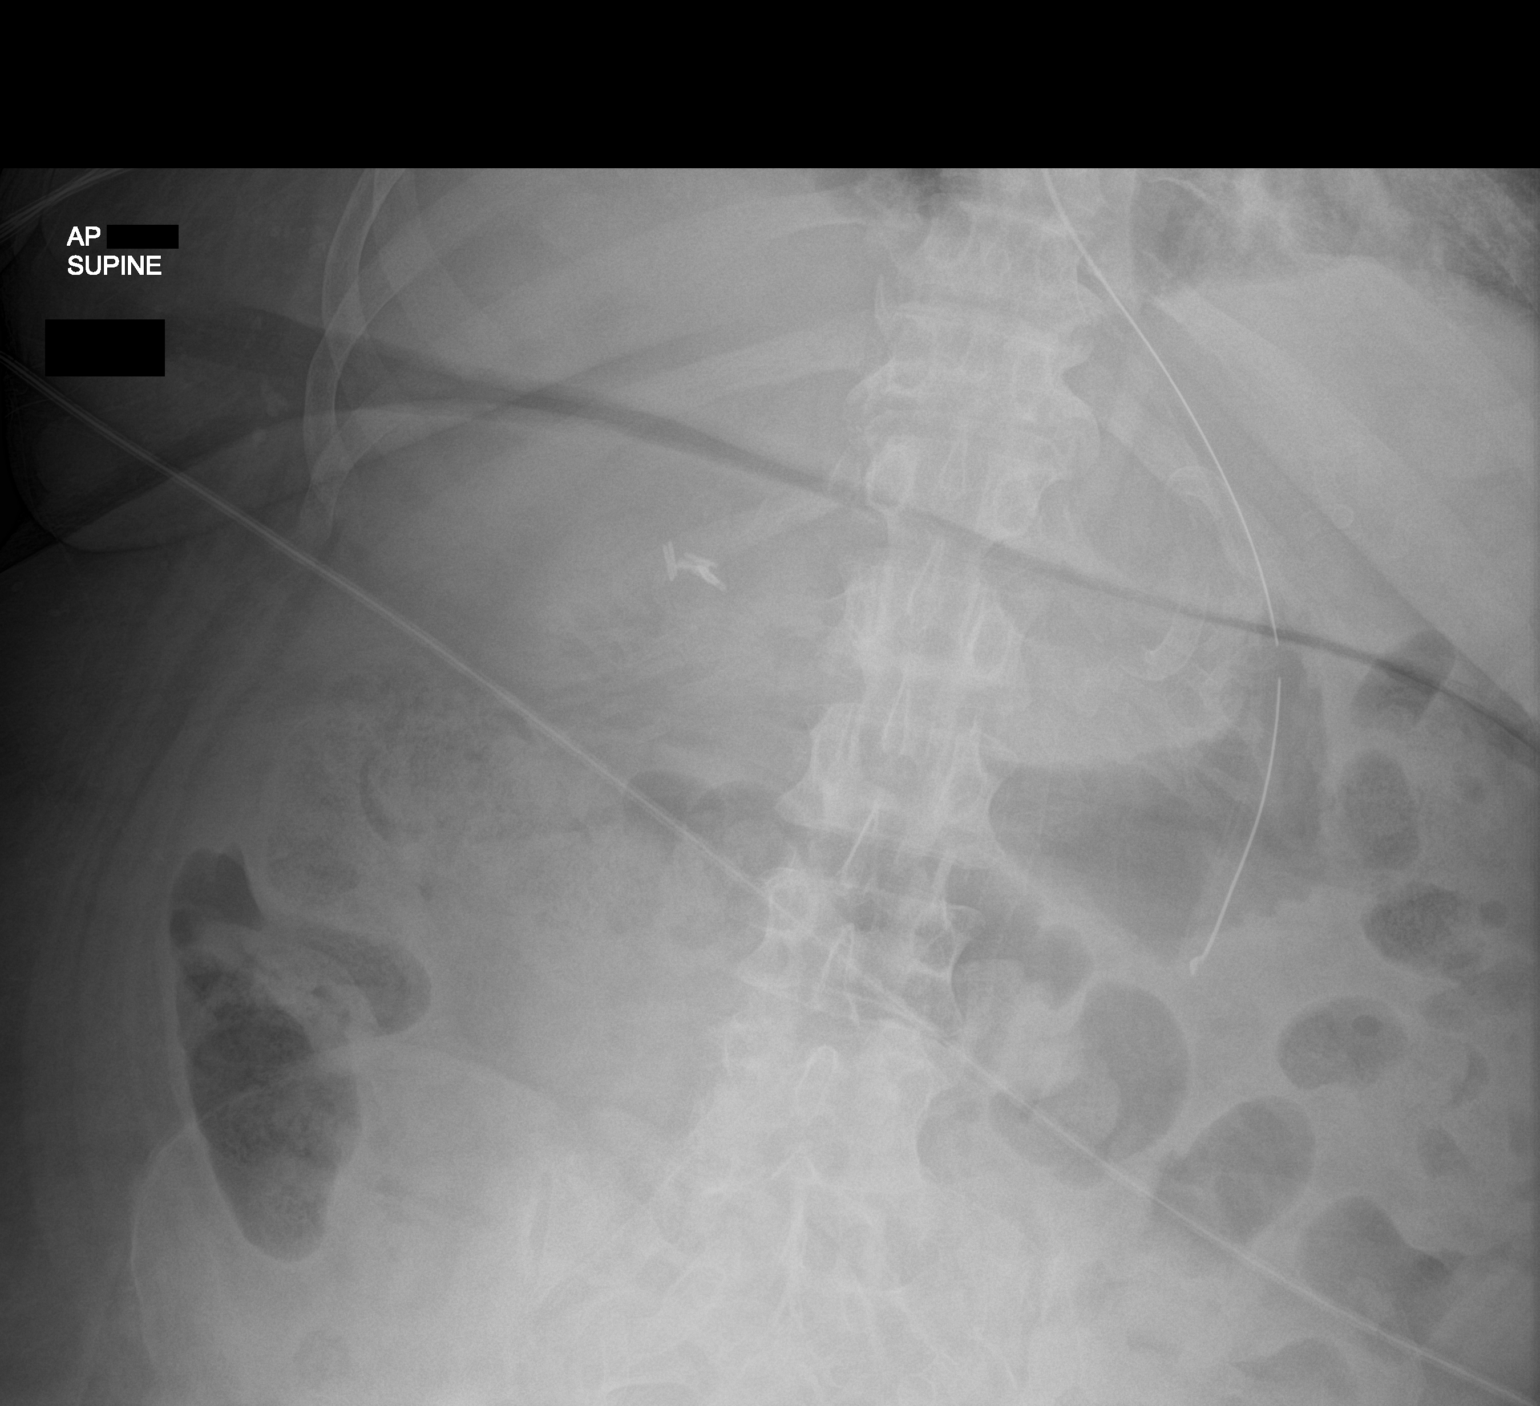

[2 of 2 positions shown; findings below may reference images not displayed]

FINDINGS: Nasogastric tube passes well below the diaphragm, tip in the mid to
distal stomach.

Normal bowel gas pattern.
IMPRESSION: Well-positioned nasal/orogastric tube.

## 2021-08-23 IMAGING — CT CT HEAD W/O CM
3 of 4 series · 14 of 47 positions shown, 16 images · non-contrast
Comparison: CT head [DATE].

CLINICAL DATA: Stroke, hemorrhagic



[Series 2: head wo · axial · 0.37mm/px · z∈[-140,+17]mm · 8 of 45 slices shown, 10 images]
[im 5/45  brain]
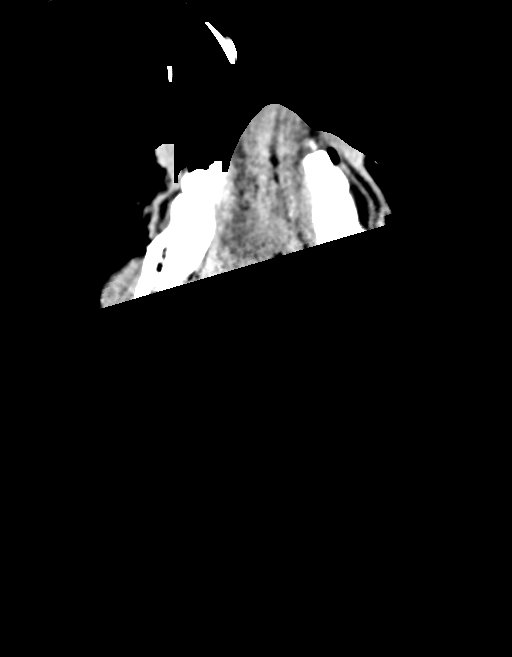
[im 5/45  bone]
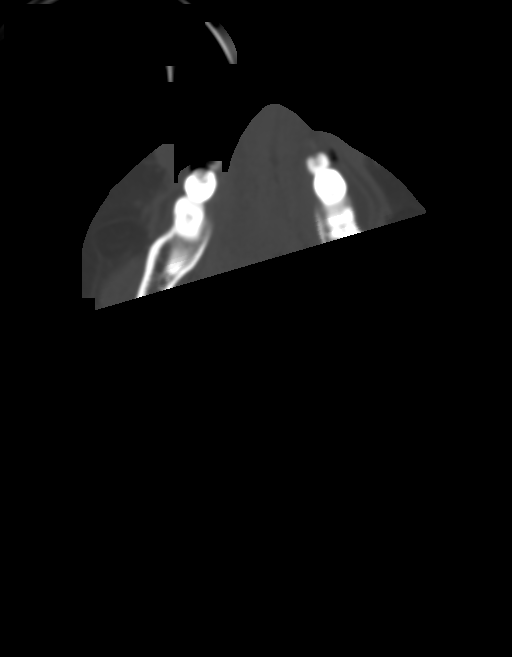
[im 10/45  brain]
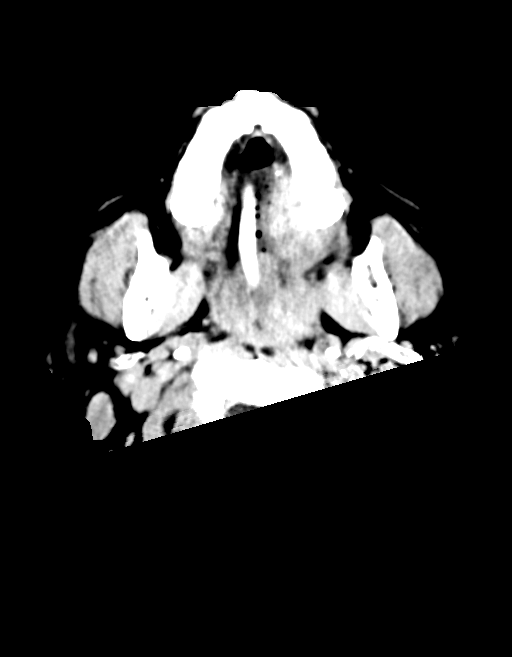
[im 15/45  brain]
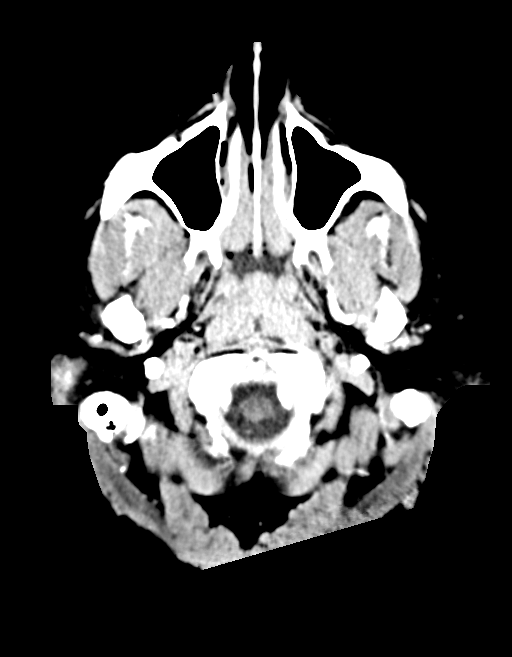
[im 20/45  brain]
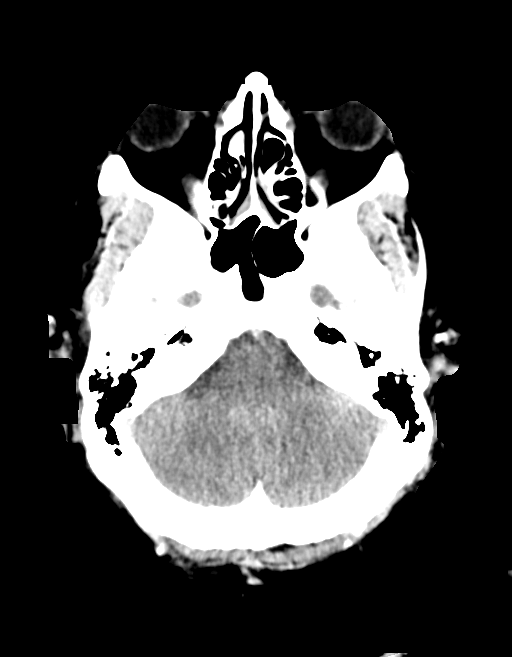
[im 25/45  brain]
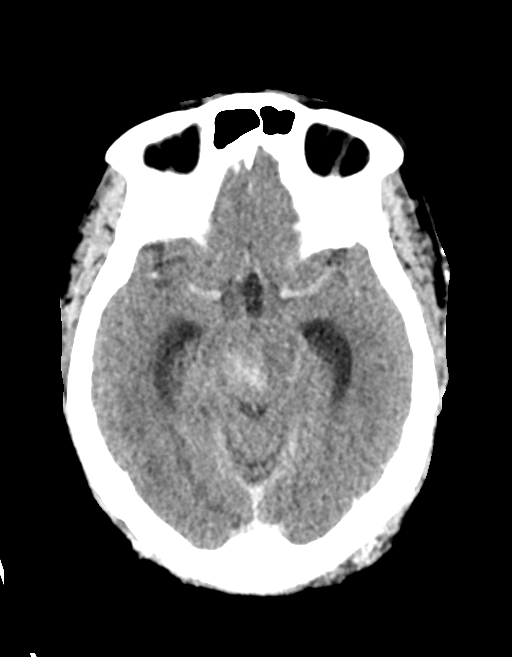
[im 25/45  bone]
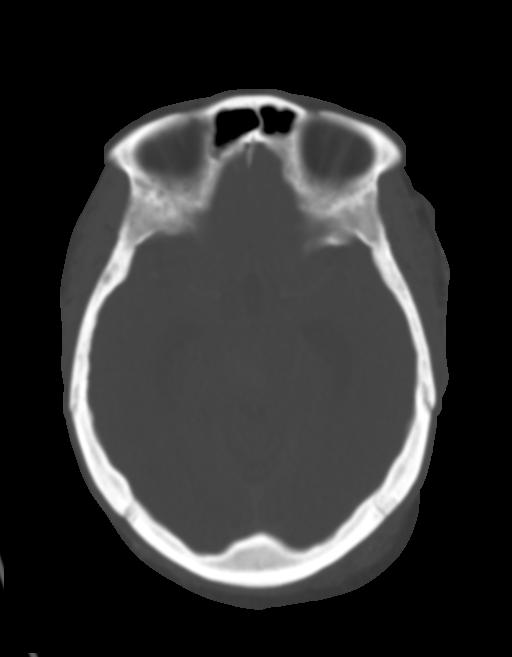
[im 30/45  brain]
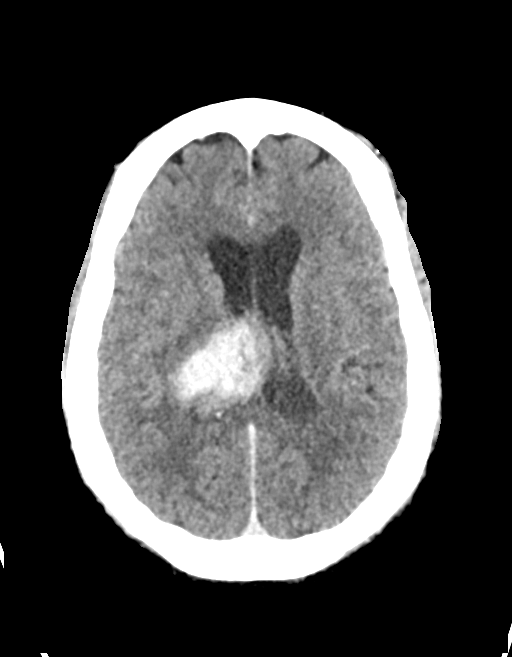
[im 35/45  brain]
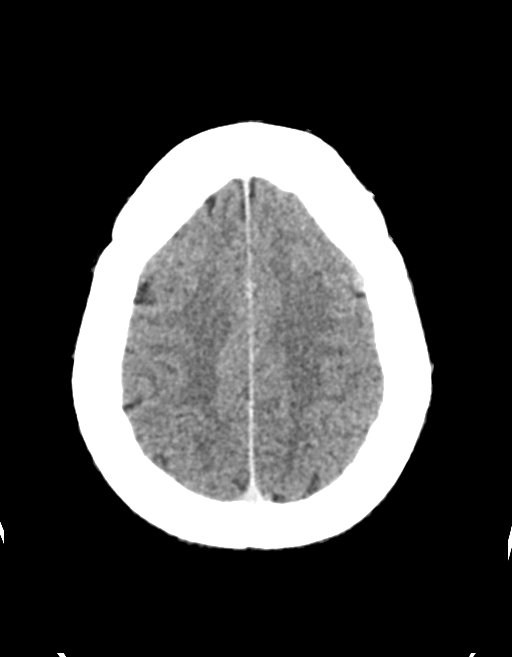
[im 40/45  brain]
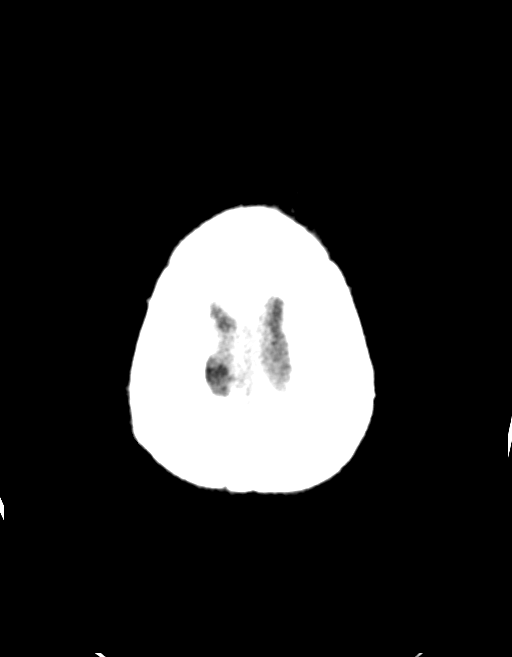

[Series 4: cor soft · coronal · 0.37mm/px · 3 of 80 slices shown]
[im 30/80  brain]
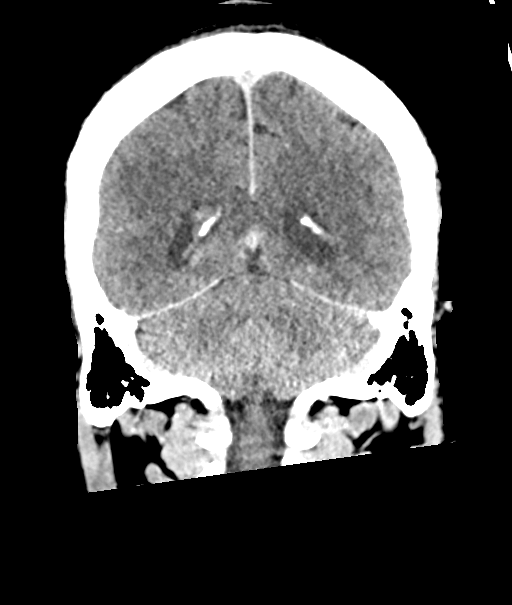
[im 37/80  brain]
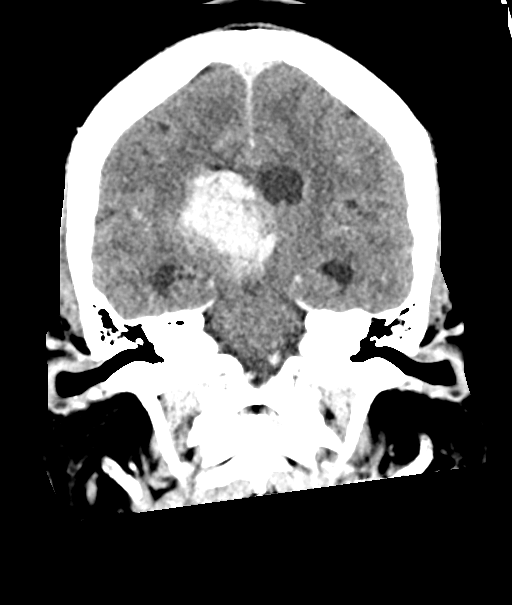
[im 44/80  brain]
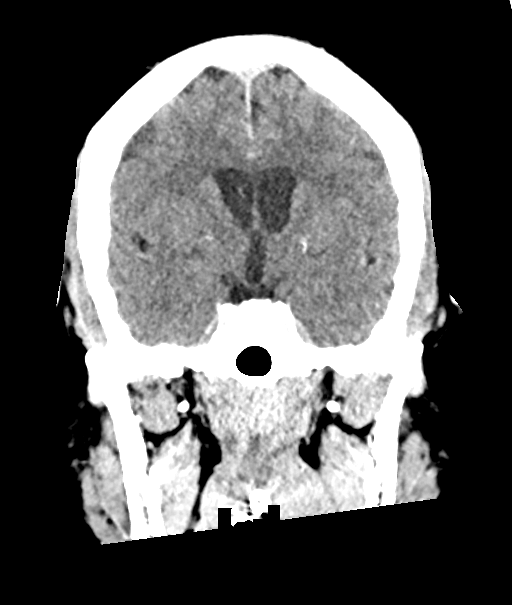

[Series 5: sag soft · sagittal · 0.43mm/px · 3 of 63 slices shown]
[im 23/63  brain]
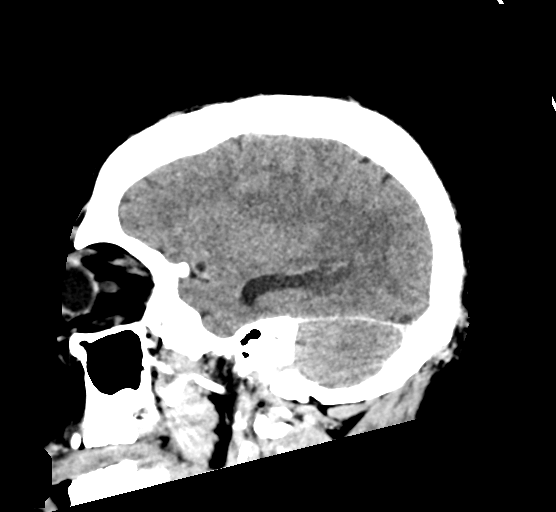
[im 32/63  brain]
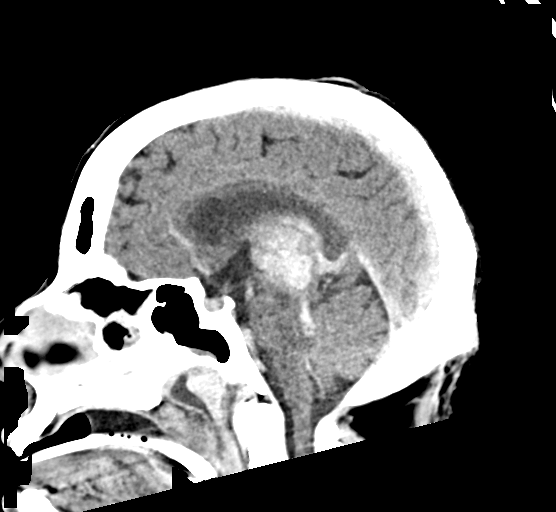
[im 40/63  brain]
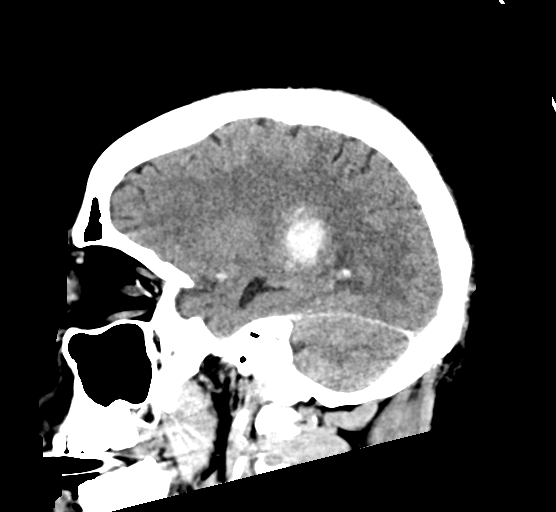

[14 of 47 positions shown; findings below may reference images not displayed]

FINDINGS: Brain: Significantly increased size of a an acute hematoma in the
right thalamus, now measuring 3.6 x 2.5 x 3.8 Cm (previously 2.9 x
1.7 x 3.1 cm). Small volume intraventricular extension of hemorrhage
with hemorrhage noted in the cerebral aqueduct and fourth ventricle.
Regional mass effect with effacement of the third ventricle. No
evidence of acute large vascular territory infarct. Development of
rounding of the temporal horns, compatible with mild hydrocephalus.

Vascular: Residual contrast within the vascular system. Calcific
atherosclerosis

Skull: No acute fracture.

Sinuses/Orbits: Mild paranasal sinus mucosal thickening. No acute
orbital findings.

Other: No mastoid effusions.
IMPRESSION: 1. Interval significant increase in size of a an acute hematoma in
the right thalamus, now measuring 3.6 x 2.5 x 3.8 cm (previously
x 1.7 x 3.1 cm). Small volume intraventricular extension of
hemorrhage.
2. Interval development of mild hydrocephalus.

These results will be called to the ordering clinician or
representative by the Radiologist Assistant, and communication
documented in the PACS or [REDACTED].

## 2021-08-23 IMAGING — CT CT HEAD CODE STROKE
3 of 4 series · 15 of 47 positions shown, 18 images · non-contrast
Comparison: Brain MRI [DATE]

CLINICAL DATA: Code stroke.  Left facial droop



[Series 2: head w o · axial · 0.40mm/px · z∈[-157,-17]mm · 9 of 34 slices shown, 12 images]
[im 3/34  brain]
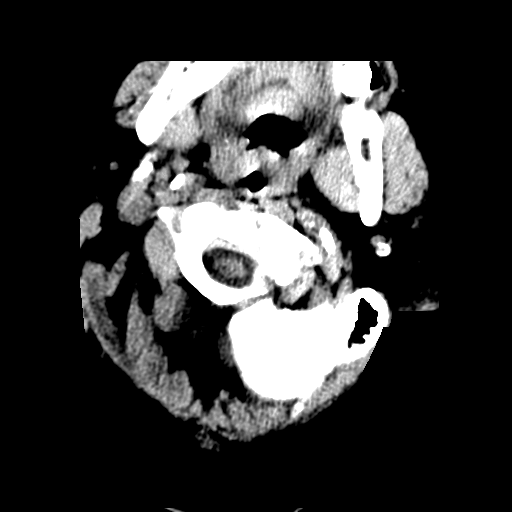
[im 3/34  bone]
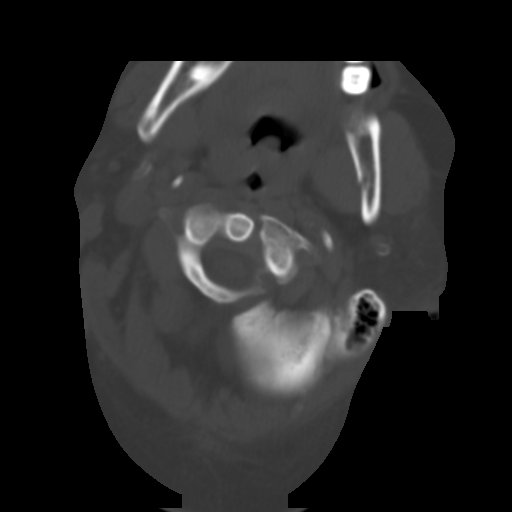
[im 8/34  brain]
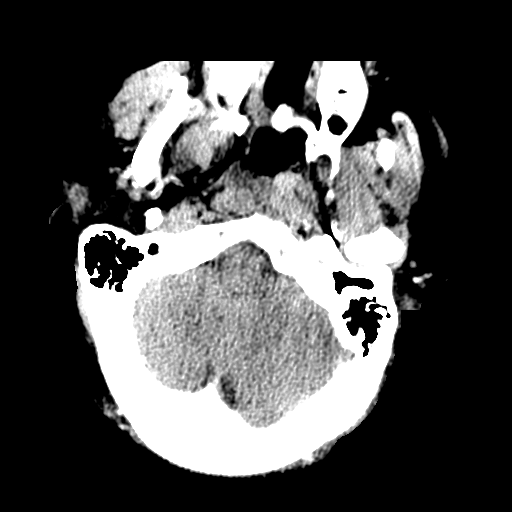
[im 10/34  brain]
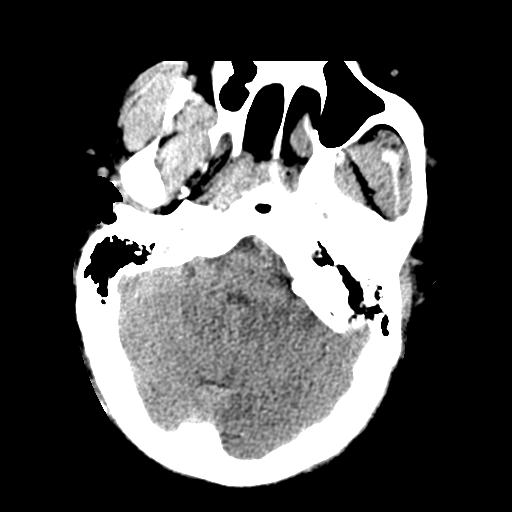
[im 15/34  brain]
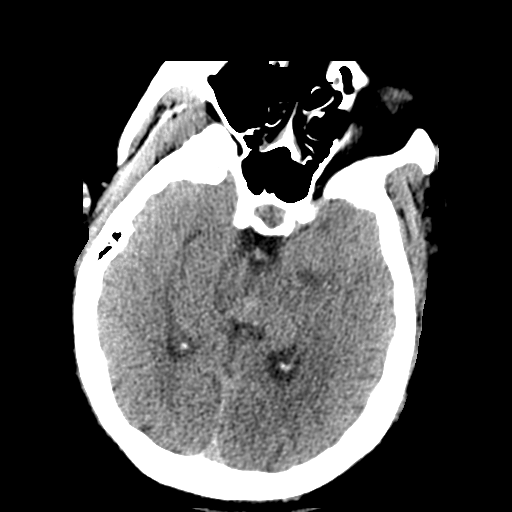
[im 17/34  brain]
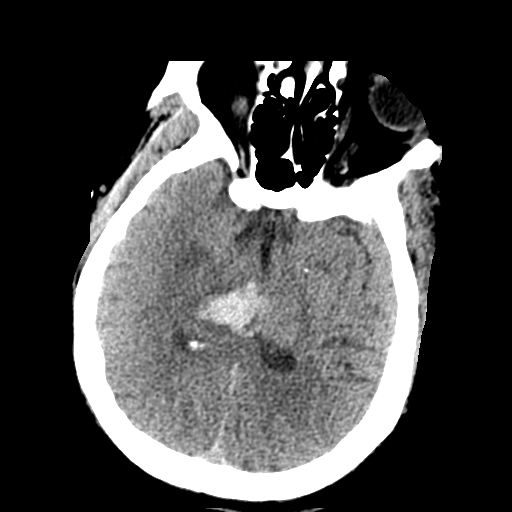
[im 17/34  bone]
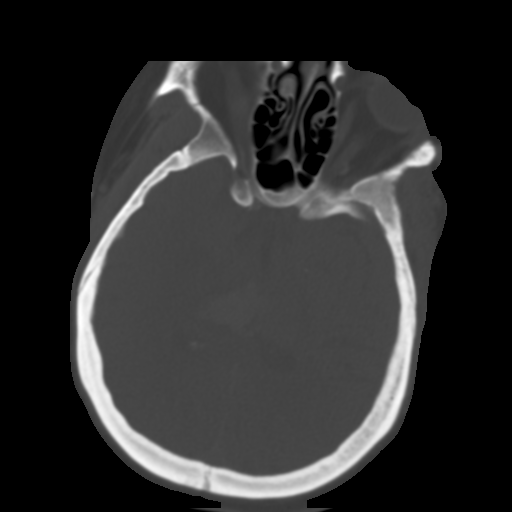
[im 19/34  brain]
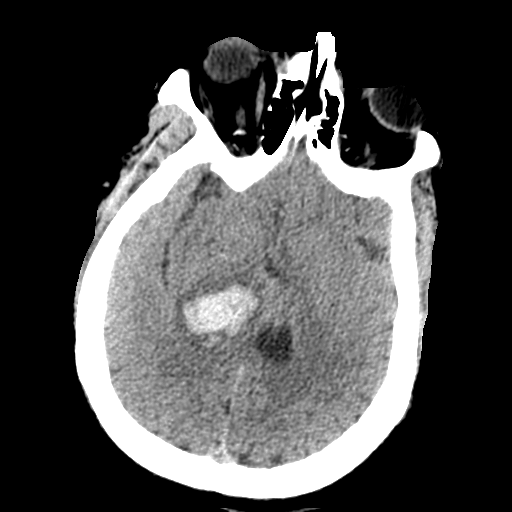
[im 24/34  brain]
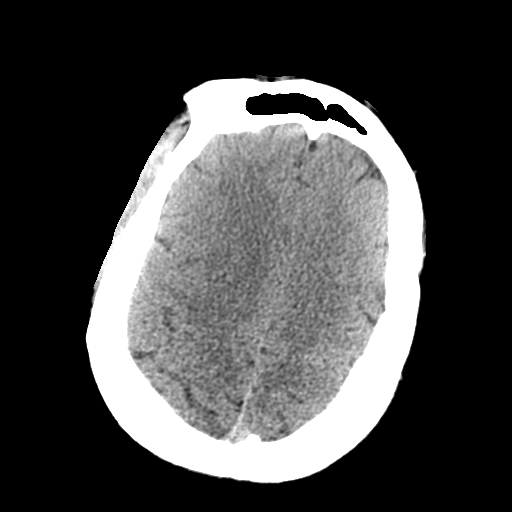
[im 26/34  brain]
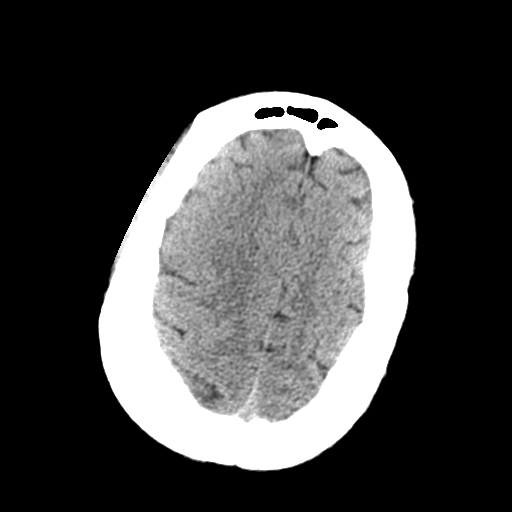
[im 31/34  brain]
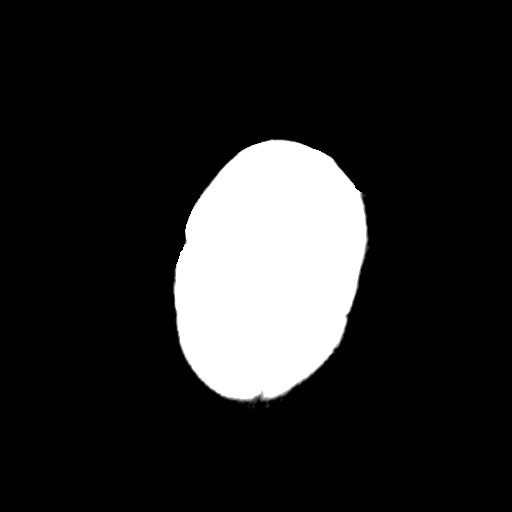
[im 31/34  bone]
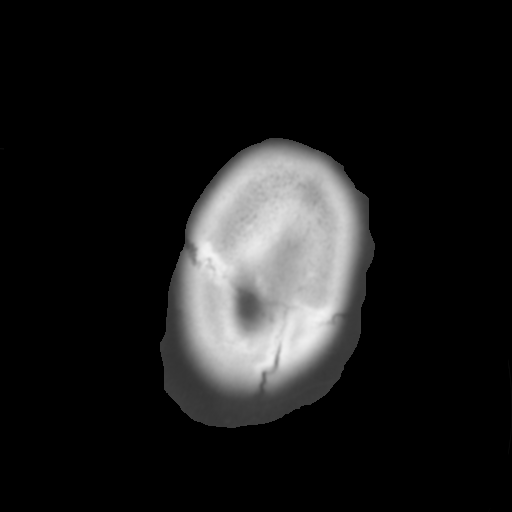

[Series 4: coronal soft · coronal · 0.37mm/px · 3 of 75 slices shown]
[im 25/75  brain]
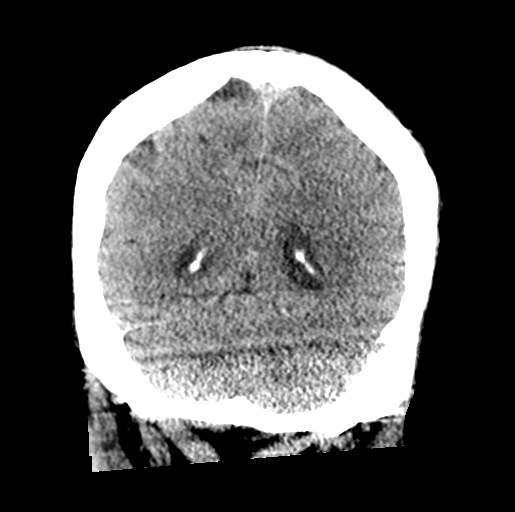
[im 33/75  brain]
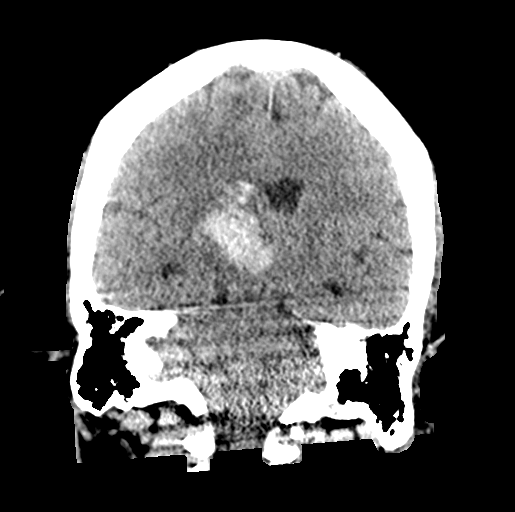
[im 42/75  brain]
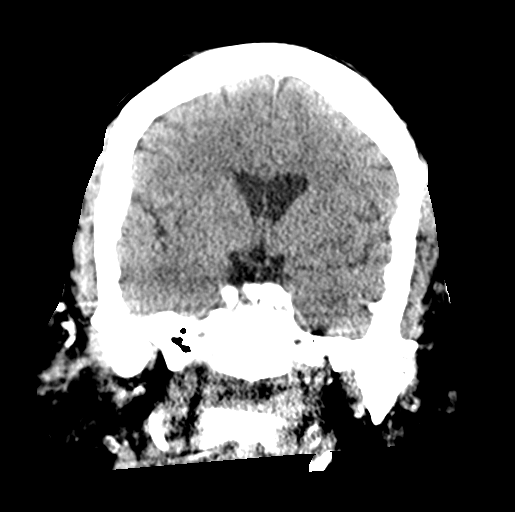

[Series 5: sagittal soft · sagittal · 0.38mm/px · 3 of 66 slices shown]
[im 23/66  brain]
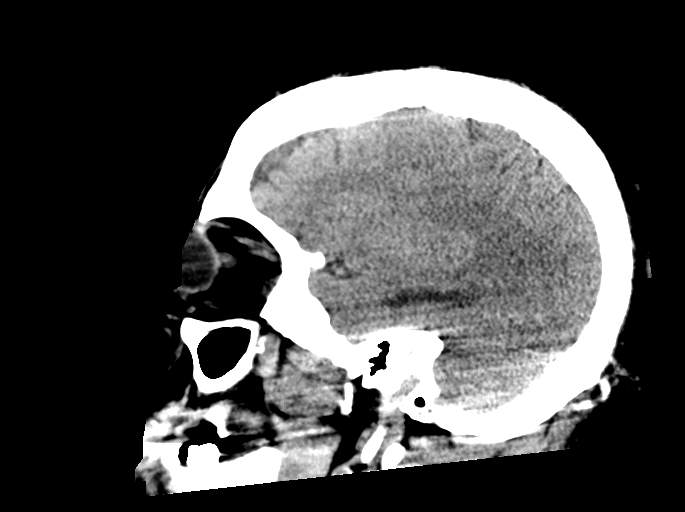
[im 33/66  brain]
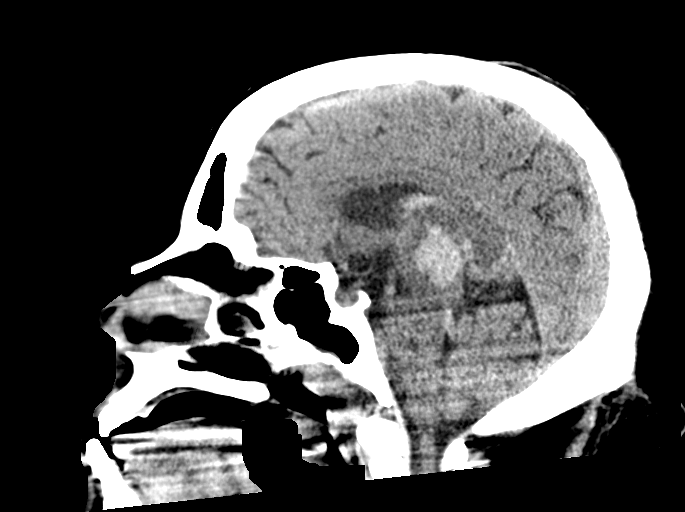
[im 43/66  brain]
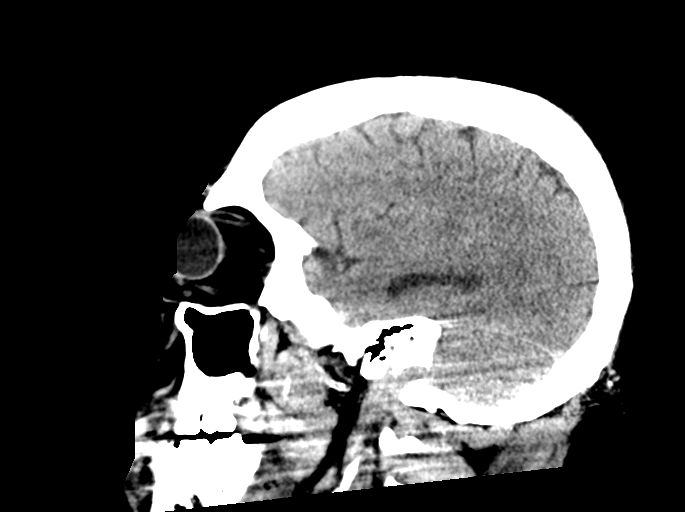

[15 of 47 positions shown; findings below may reference images not displayed]

FINDINGS: Brain: 33 x 18 x 17 mm acute hemorrhage centered in the right
thalamus with mild intraventricular extension into the right lateral
and fourth ventricle. This location is usually related to
hypertensive bleeds. The hematoma tracks towards the midbrain on the
right. No evidence of regional acute infarction or mass. No
hydrocephalus.

Vascular: No hyperdense vessel or unexpected calcification.

Skull: Normal. Negative for fracture or focal lesion.

Sinuses/Orbits: No acute finding.  Bilateral cataract resection

Other: Critical Value/emergent results were called by telephone at
the time of interpretation on [DATE] at [DATE] to provider
PAVON , who is already aware
IMPRESSION: 5 cc acute hematoma in the right thalamus with intraventricular
extension. No hydrocephalus.

## 2021-08-23 IMAGING — CT CT ANGIO NECK
3 of 8 series · 10 of 36 positions shown · IV contrast (Omnipaque or Isovue)
Comparison: Noncontrast head CT from earlier today

CLINICAL DATA: Code stroke for left-sided weakness

EXAM:
CT ANGIOGRAPHY HEAD AND NECK
TECHNIQUE: Multidetector CT imaging of the head and neck was performed using
the standard protocol during bolus administration of intravenous
contrast. Multiplanar CT image reconstructions and MIPs were
obtained to evaluate the vascular anatomy. Carotid stenosis
measurements (when applicable) are obtained utilizing NASCET
criteria, using the distal internal carotid diameter as the
denominator.

[Series 5: cta head & neck · axial · 0.46mm/px · z∈[-288,-46]mm · 6 of 684 slices shown]
[im 98/684  soft-tissue]
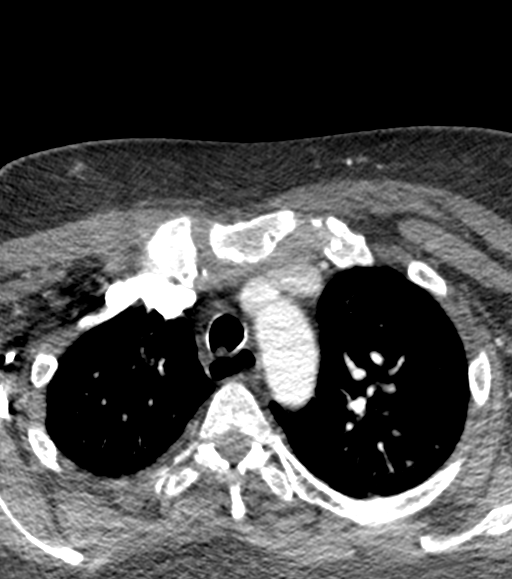
[im 196/684  bone]
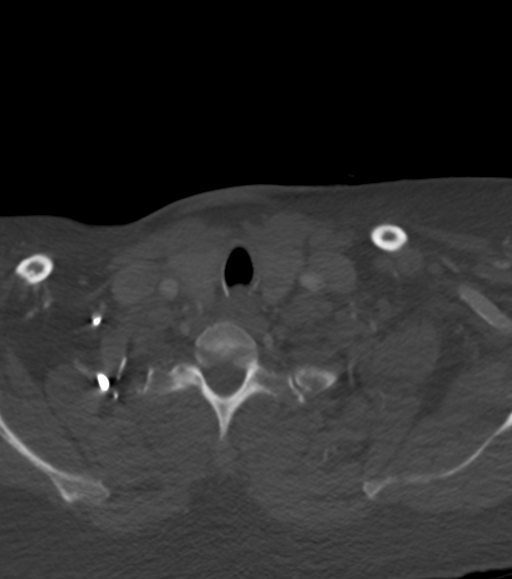
[im 293/684  soft-tissue]
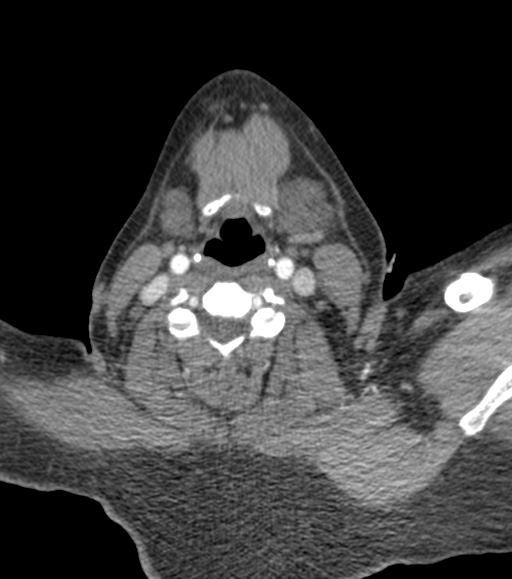
[im 391/684  bone]
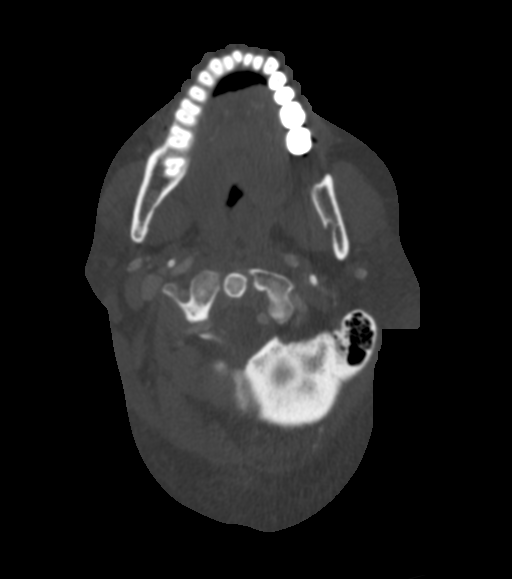
[im 488/684  soft-tissue]
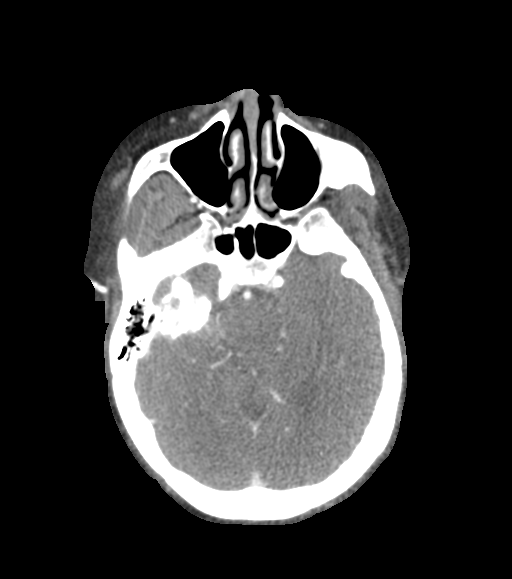
[im 586/684  bone]
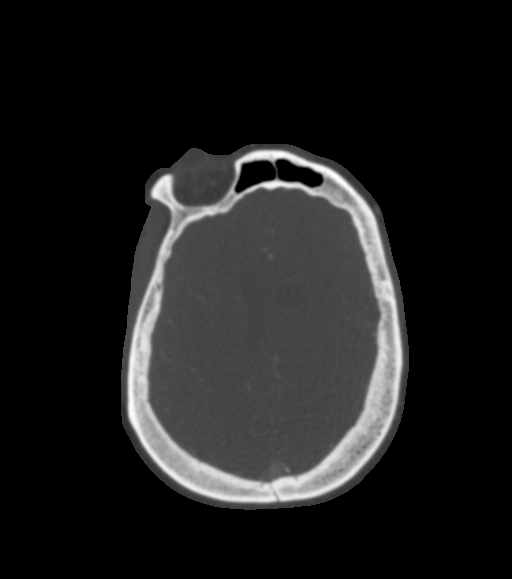

[Series 6: ax thins · axial · 0.46mm/px · z∈[-223,-110]mm · 2 of 342 slices shown]
[im 114/342  soft-tissue]
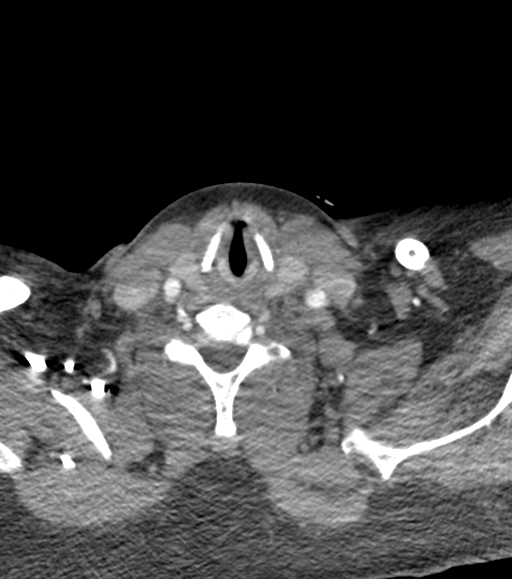
[im 228/342  soft-tissue]
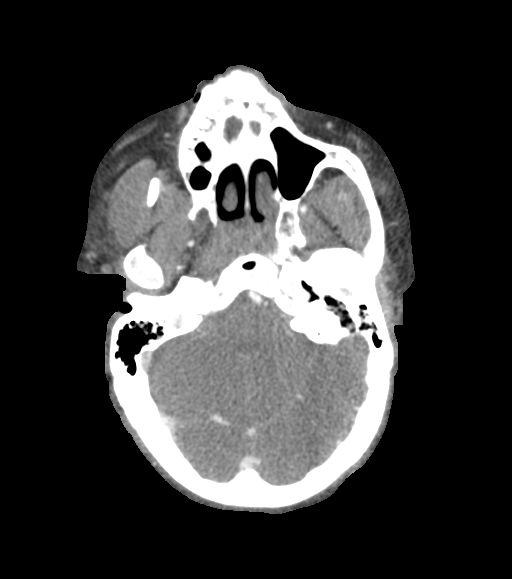

[Series 8: sag thin · sagittal · 0.53mm/px · 2 of 230 slices shown]
[im 75/230  soft-tissue]
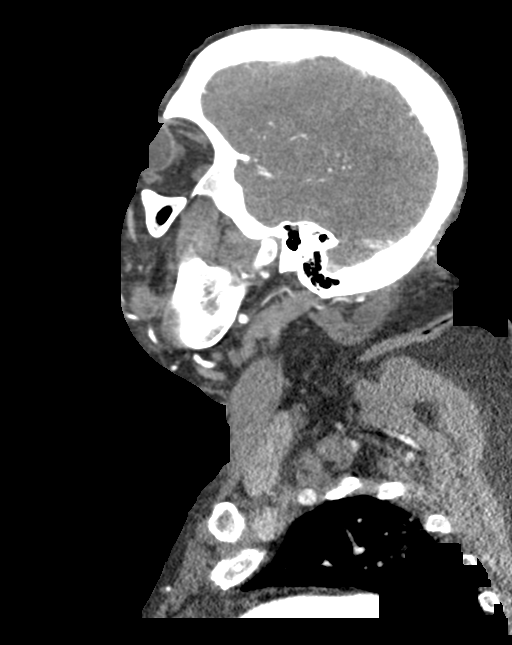
[im 156/230  soft-tissue]
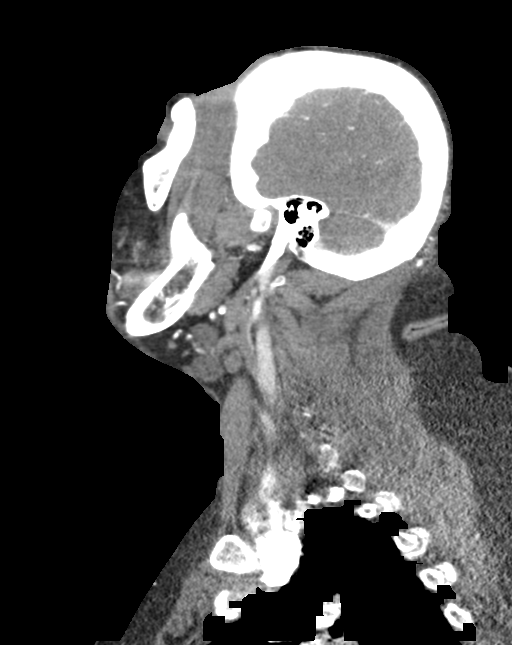

[10 of 36 positions shown; findings below may reference images not displayed]

RADIATION DOSE REDUCTION: This exam was performed according to the
departmental dose-optimization program which includes automated
exposure control, adjustment of the mA and/or kV according to
patient size and/or use of iterative reconstruction technique.

CONTRAST:  100mL OMNIPAQUE IOHEXOL 350 MG/ML SOLN
FINDINGS: CTA NECK FINDINGS

Aortic arch: No acute finding

Right carotid system: Mild atheromatous plaque at the bifurcation.
No stenosis or ulceration.

Left carotid system: Mild calcified plaque at the bifurcation. No
stenosis or ulceration.

Vertebral arteries: Proximal subclavian atherosclerosis without flow
reducing stenosis. Codominant vertebral arteries there are smoothly
contoured and widely patent to the dura.

Skeleton: No acute or aggressive finding

Other neck: No acute finding

Upper chest: Fine interstitial opacity at the apices symmetrically.

Review of the MIP images confirms the above findings

CTA HEAD FINDINGS

Anterior circulation: Calcified plaque along the carotid siphons. No
occlusion, beading, or proximal flow limiting stenosis. Negative for
aneurysm.

Posterior circulation: Calcified plaque along the V4 segments. No
branch occlusion, beading, or aneurysm. No evidence of vascular
malformation. Equivocal for spot sign in the lower aspect of the
hematoma.

Venous sinuses: Diffusely patent

Anatomic variants: None significant

Review of the MIP images confirms the above findings
IMPRESSION: 1. No arterial lesion underlying the right thalamic hematoma.
Equivocal for spot sign which would be an adverse finding suggesting
continued growth.
2. Atherosclerosis without flow limiting stenosis of major vessels.
3. Interstitial opacity at the apices, age-indeterminate. Recommend
chest radiograph.

## 2021-08-23 IMAGING — CT CT ANGIO HEAD
3 of 8 series · 10 of 36 positions shown · IV contrast (Omnipaque or Isovue)
Comparison: Noncontrast head CT from earlier today

CLINICAL DATA: Code stroke for left-sided weakness

EXAM:
CT ANGIOGRAPHY HEAD AND NECK
TECHNIQUE: Multidetector CT imaging of the head and neck was performed using
the standard protocol during bolus administration of intravenous
contrast. Multiplanar CT image reconstructions and MIPs were
obtained to evaluate the vascular anatomy. Carotid stenosis
measurements (when applicable) are obtained utilizing NASCET
criteria, using the distal internal carotid diameter as the
denominator.

[Series 5: cta head & neck · axial · 0.46mm/px · z∈[-288,-46]mm · 6 of 684 slices shown]
[im 98/684  soft-tissue]
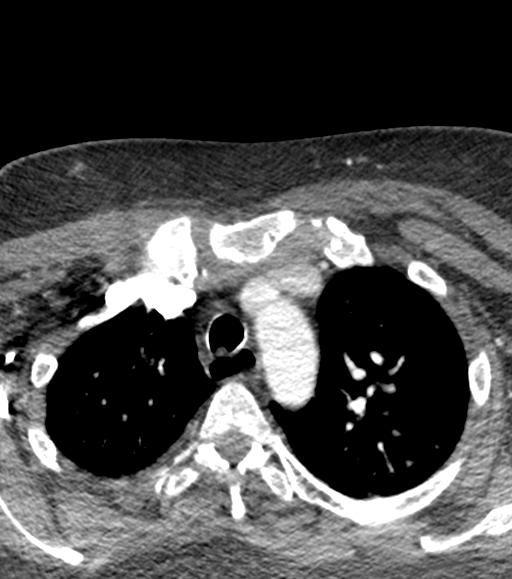
[im 196/684  bone]
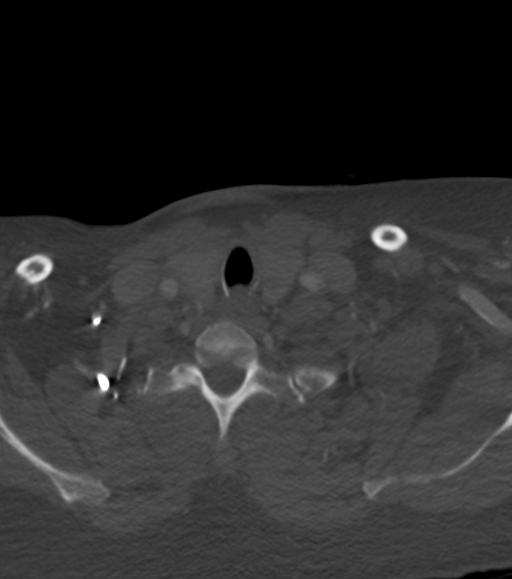
[im 293/684  soft-tissue]
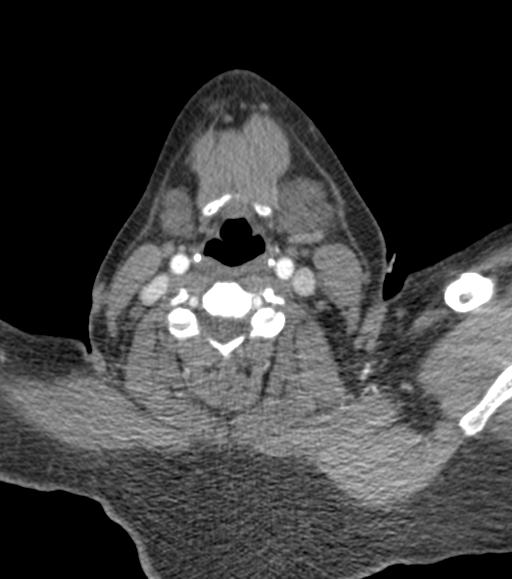
[im 391/684  bone]
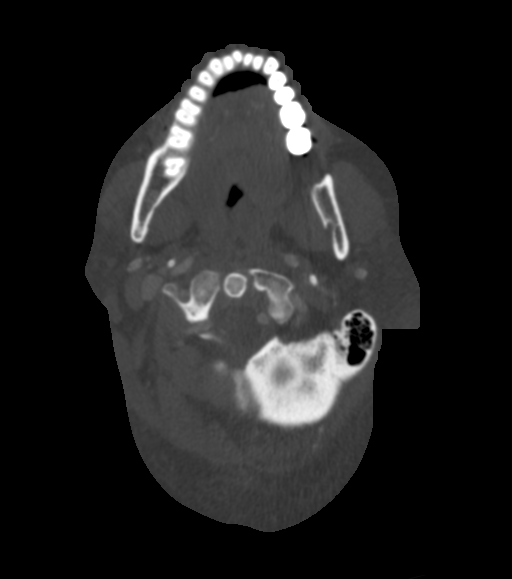
[im 488/684  soft-tissue]
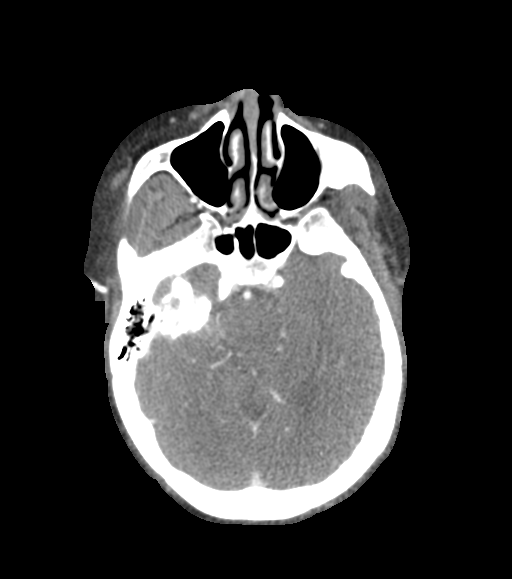
[im 586/684  bone]
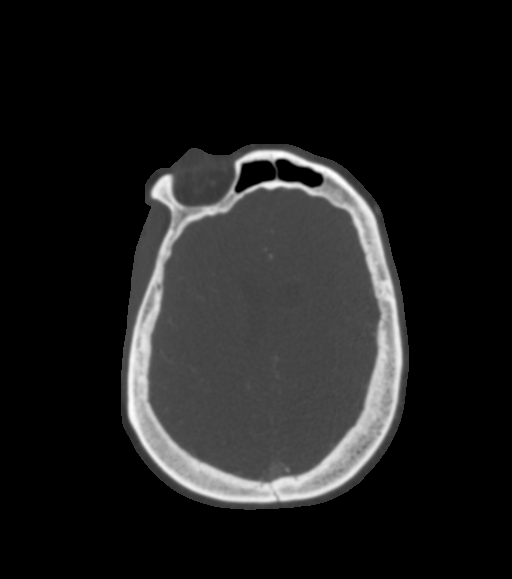

[Series 6: ax thins · axial · 0.46mm/px · z∈[-223,-110]mm · 2 of 342 slices shown]
[im 114/342  soft-tissue]
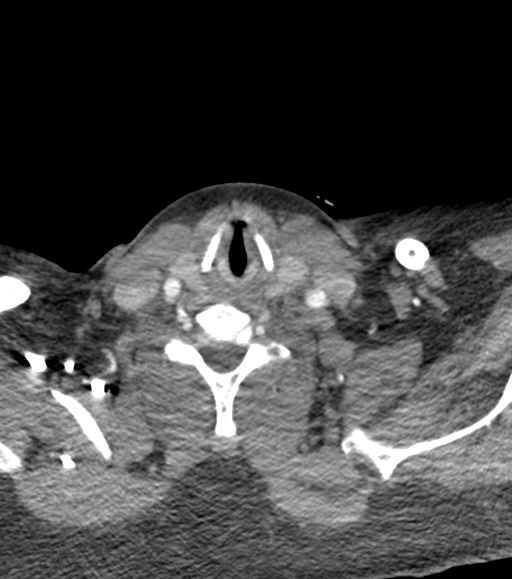
[im 228/342  soft-tissue]
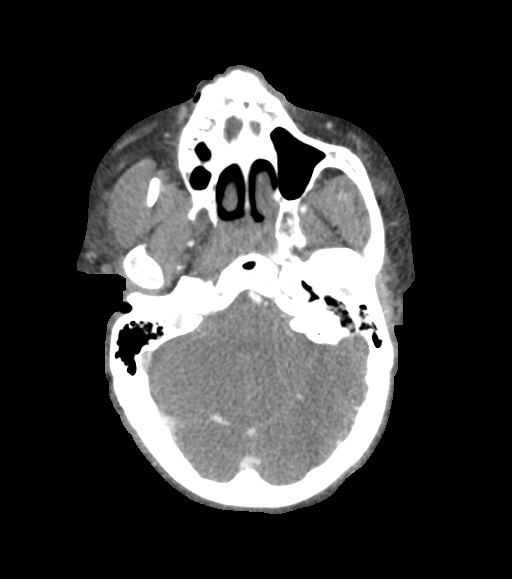

[Series 8: sag thin · sagittal · 0.53mm/px · 2 of 230 slices shown]
[im 75/230  soft-tissue]
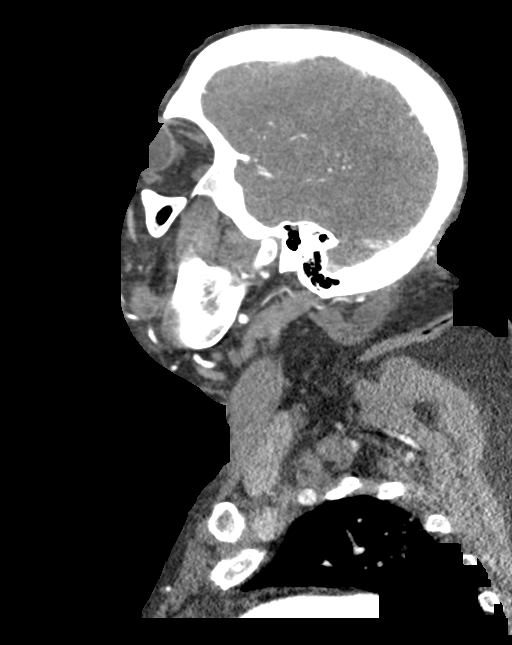
[im 156/230  soft-tissue]
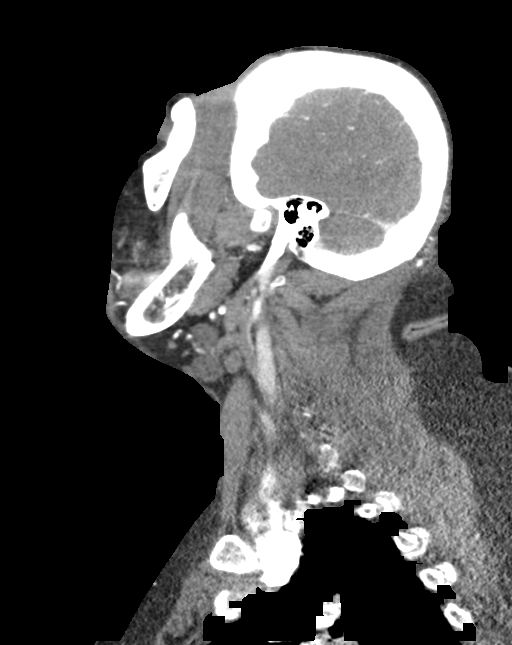

[10 of 36 positions shown; findings below may reference images not displayed]

RADIATION DOSE REDUCTION: This exam was performed according to the
departmental dose-optimization program which includes automated
exposure control, adjustment of the mA and/or kV according to
patient size and/or use of iterative reconstruction technique.

CONTRAST:  100mL OMNIPAQUE IOHEXOL 350 MG/ML SOLN
FINDINGS: CTA NECK FINDINGS

Aortic arch: No acute finding

Right carotid system: Mild atheromatous plaque at the bifurcation.
No stenosis or ulceration.

Left carotid system: Mild calcified plaque at the bifurcation. No
stenosis or ulceration.

Vertebral arteries: Proximal subclavian atherosclerosis without flow
reducing stenosis. Codominant vertebral arteries there are smoothly
contoured and widely patent to the dura.

Skeleton: No acute or aggressive finding

Other neck: No acute finding

Upper chest: Fine interstitial opacity at the apices symmetrically.

Review of the MIP images confirms the above findings

CTA HEAD FINDINGS

Anterior circulation: Calcified plaque along the carotid siphons. No
occlusion, beading, or proximal flow limiting stenosis. Negative for
aneurysm.

Posterior circulation: Calcified plaque along the V4 segments. No
branch occlusion, beading, or aneurysm. No evidence of vascular
malformation. Equivocal for spot sign in the lower aspect of the
hematoma.

Venous sinuses: Diffusely patent

Anatomic variants: None significant

Review of the MIP images confirms the above findings
IMPRESSION: 1. No arterial lesion underlying the right thalamic hematoma.
Equivocal for spot sign which would be an adverse finding suggesting
continued growth.
2. Atherosclerosis without flow limiting stenosis of major vessels.
3. Interstitial opacity at the apices, age-indeterminate. Recommend
chest radiograph.

## 2021-08-23 IMAGING — DX DG CHEST 1V PORT
2 series · 2 of 2 positions shown · non-contrast
Comparison: [DATE]

CLINICAL DATA: Endotracheal and nasogastric tube placement

Stroke
ESRD
Diabetes
EXAM:
PORTABLE CHEST - 1 VIEW

[chest ap (1 of 2)]
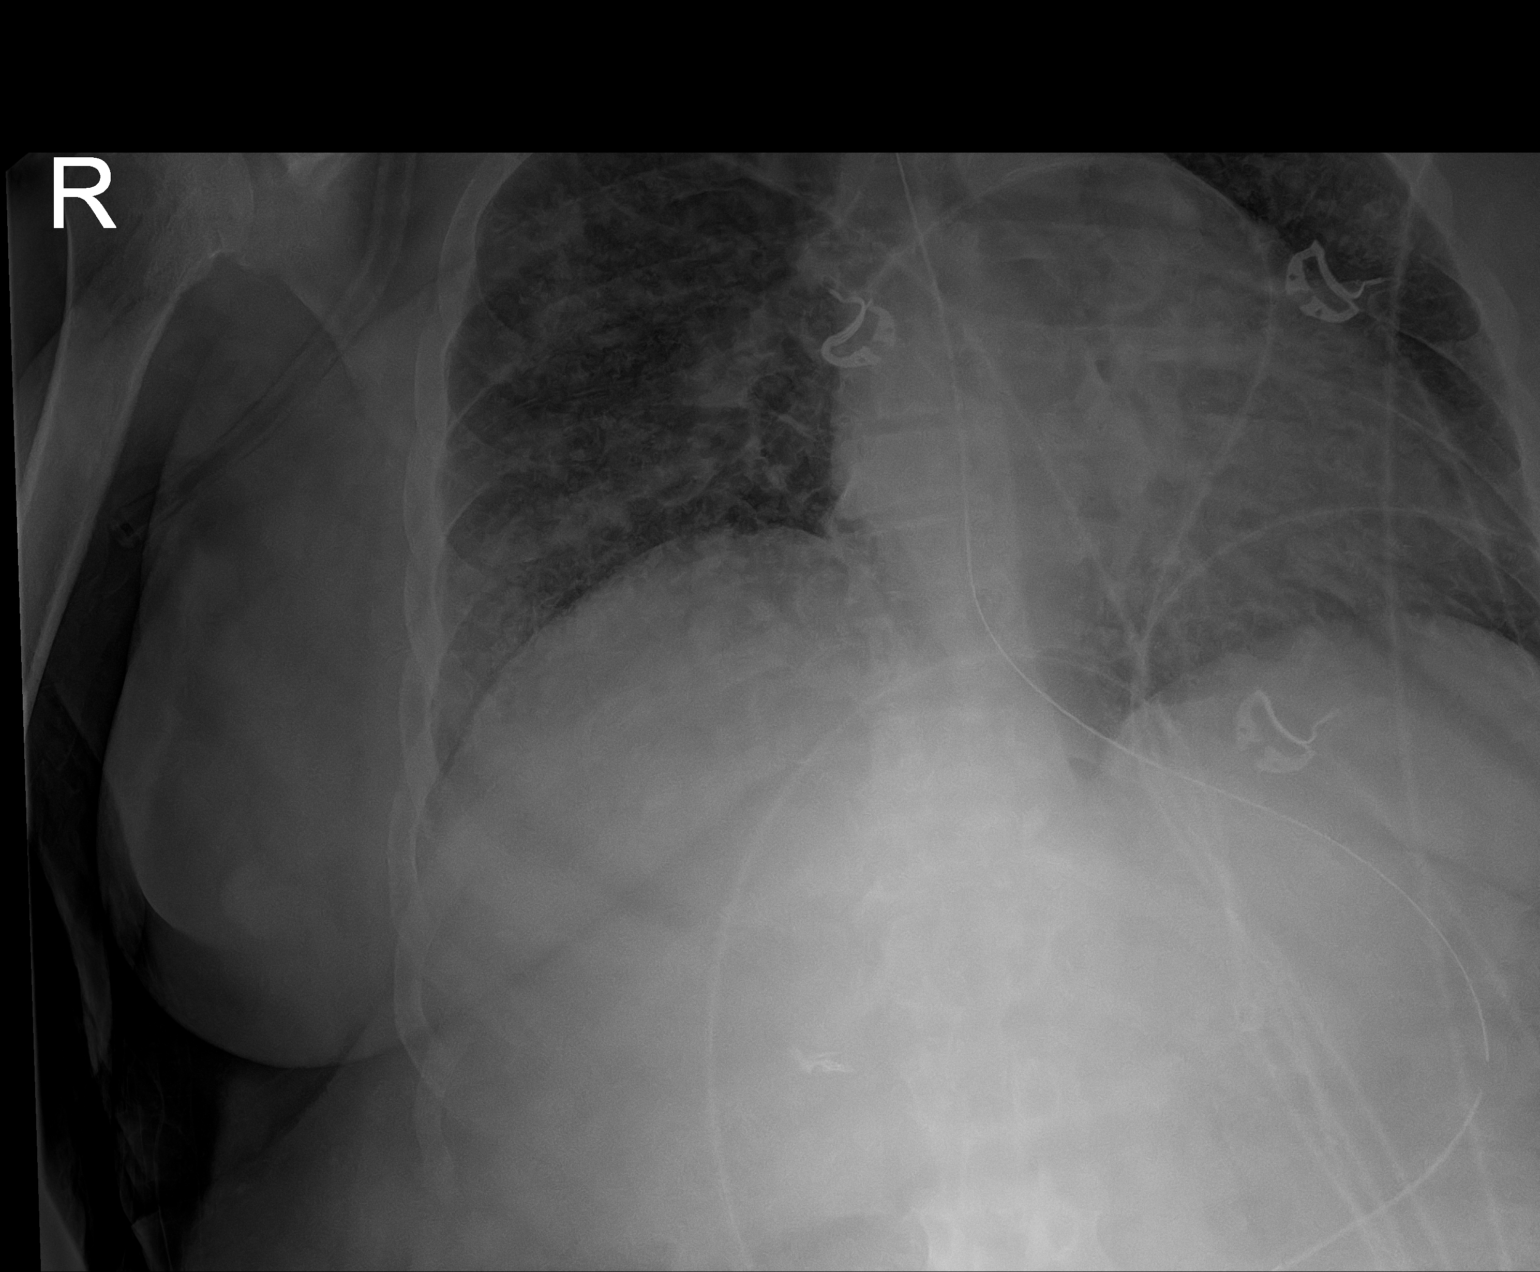

[chest ap (2 of 2)]
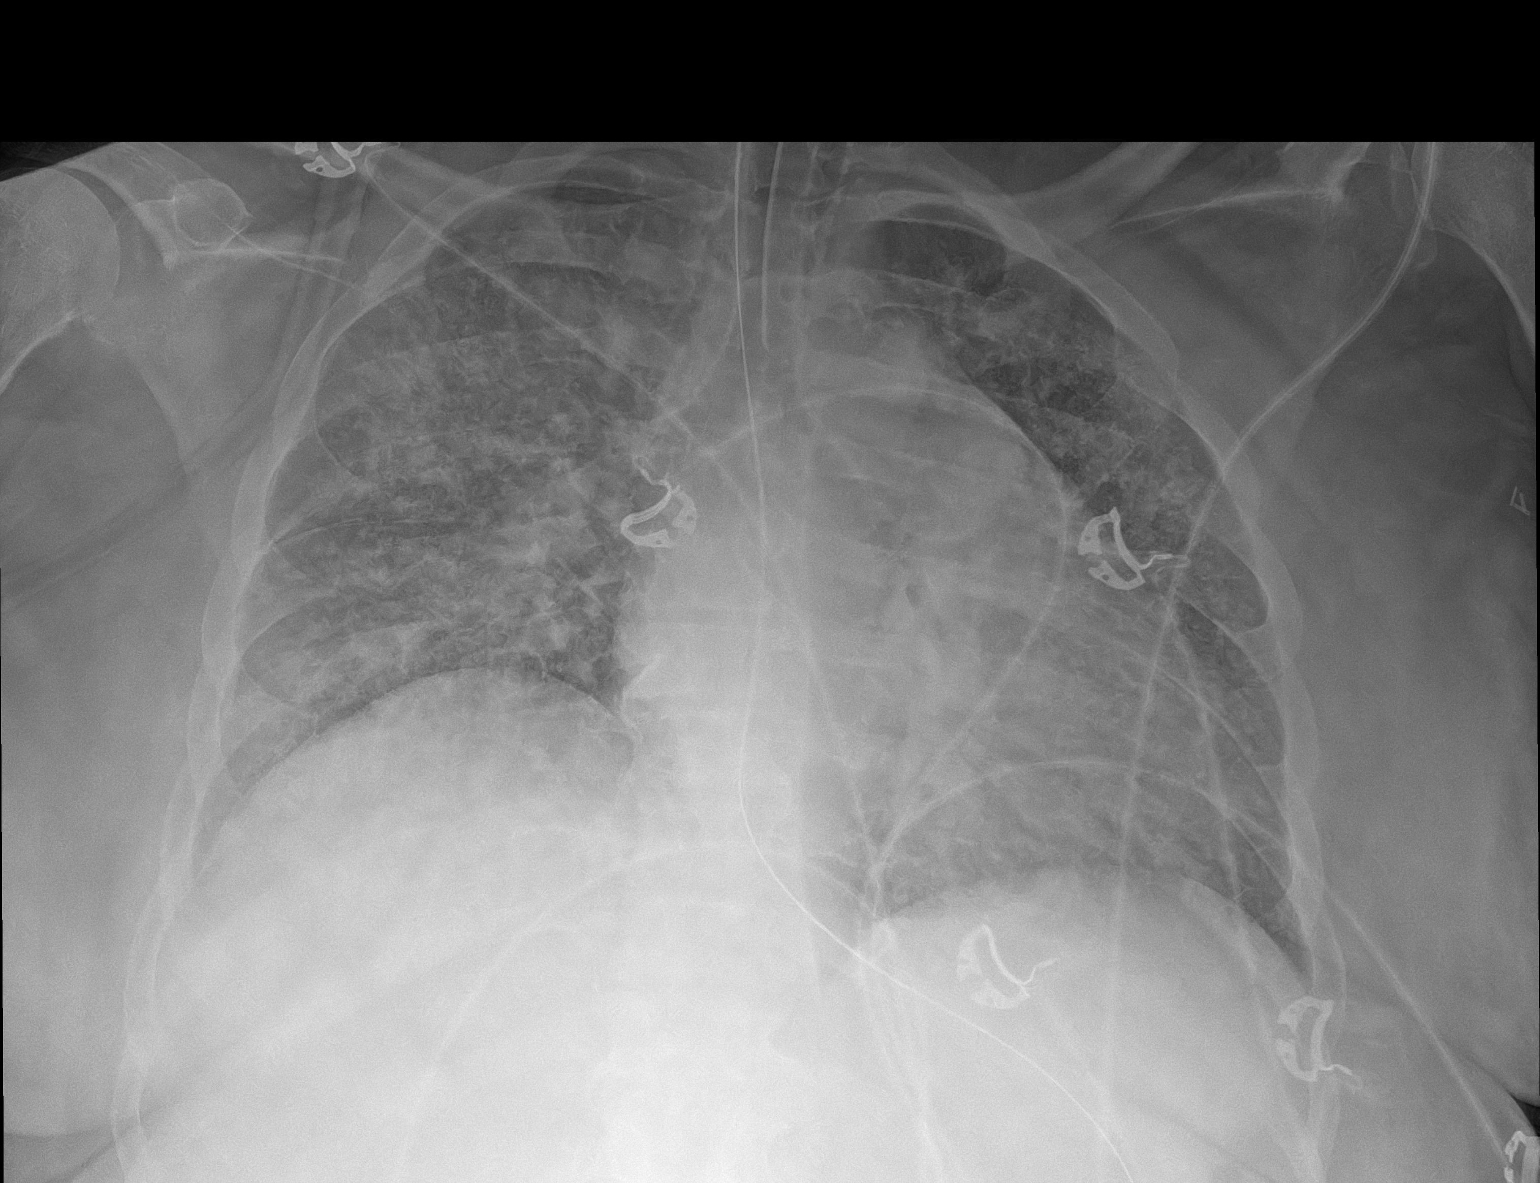

[2 of 2 positions shown; findings below may reference images not displayed]

FINDINGS: Unchanged cardiomegaly. Diffuse bilateral lung opacities.
Endotracheal tube tip located 1.8 cm of the carina. Nasogastric tube
terminates in the left upper quadrant.
IMPRESSION: 1. Mild cardiomegaly.
2. Diffuse bilateral lung opacities may be due to pulmonary edema or
multifocal pneumonia.

## 2021-08-23 IMAGING — CT CT HEAD W/O CM
4 series · 16 of 47 positions shown, 18 images · non-contrast
Comparison: [DATE] [DATE] a.m.

CLINICAL DATA: Hydrocephalus, EVD placement



[Series 3: head without · axial · non-contrast · 0.44mm/px · z∈[-401,-261]mm · 7 of 38 slices shown, 9 images]
[im 5/38  brain]
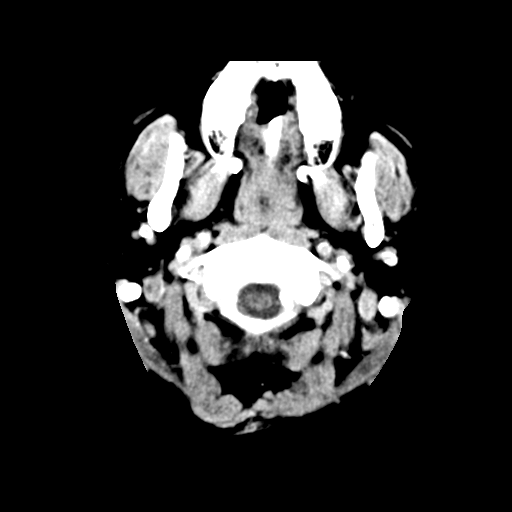
[im 5/38  bone]
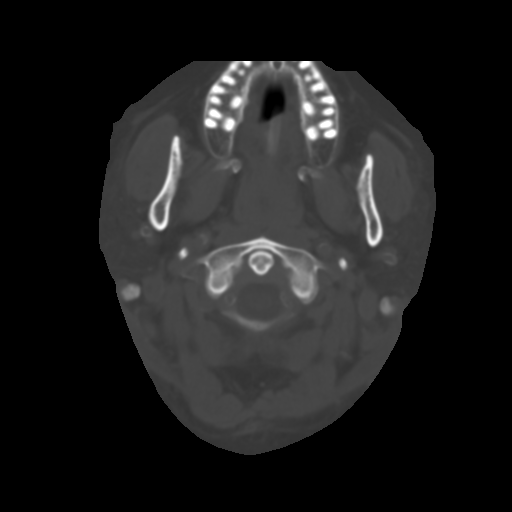
[im 10/38  brain]
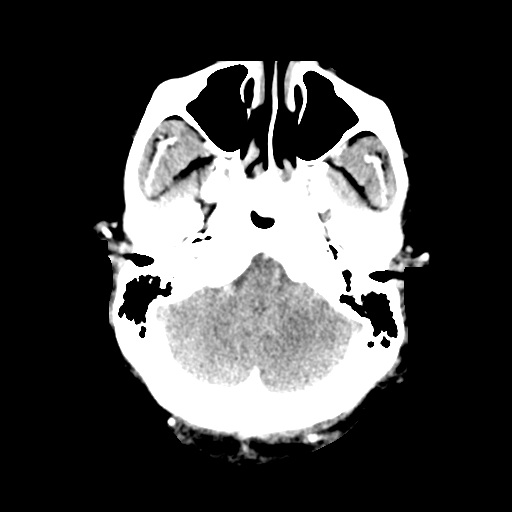
[im 14/38  brain]
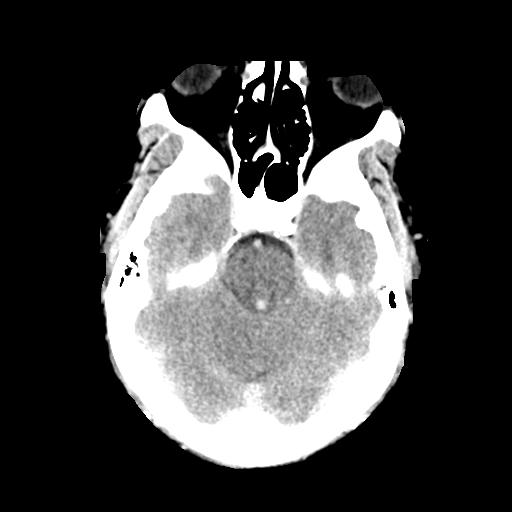
[im 19/38  brain]
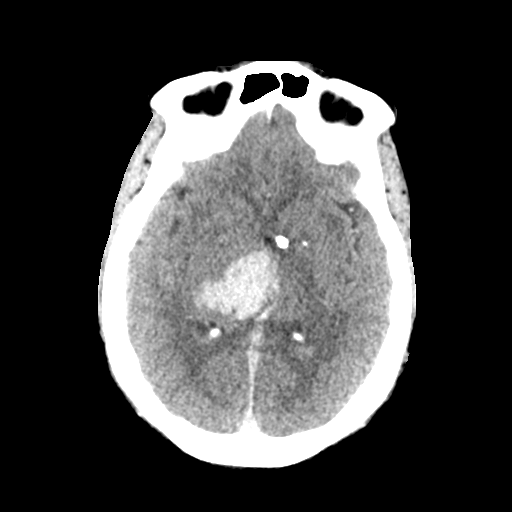
[im 24/38  brain]
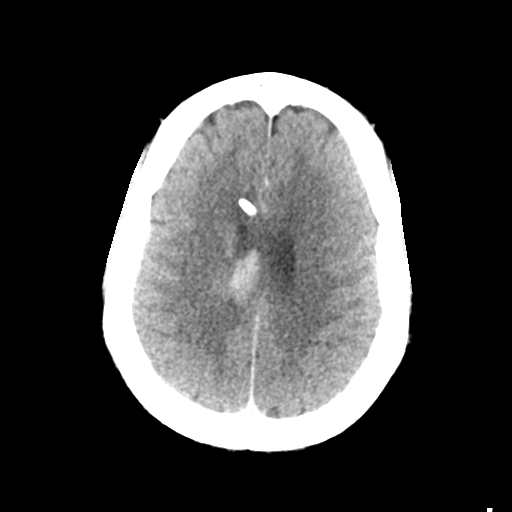
[im 24/38  bone]
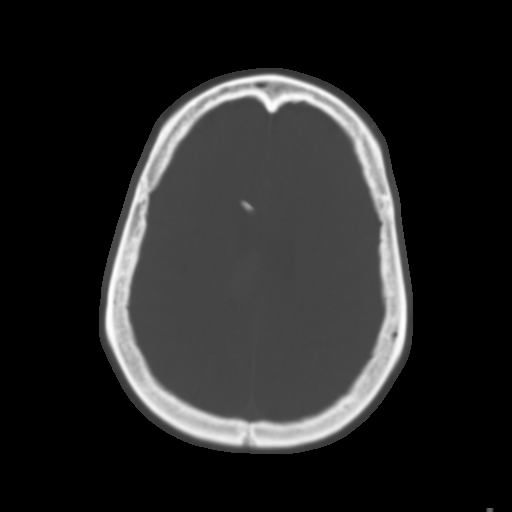
[im 28/38  brain]
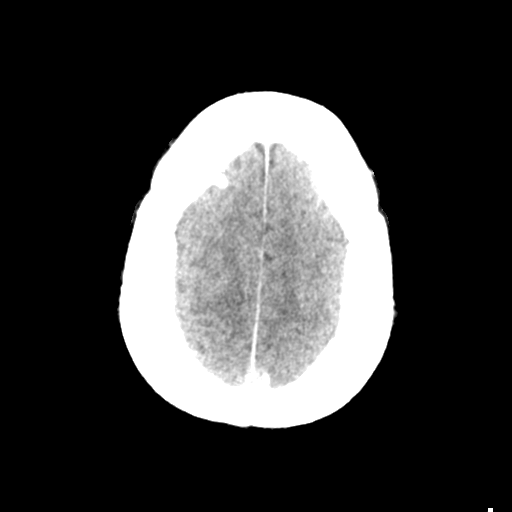
[im 33/38  brain]
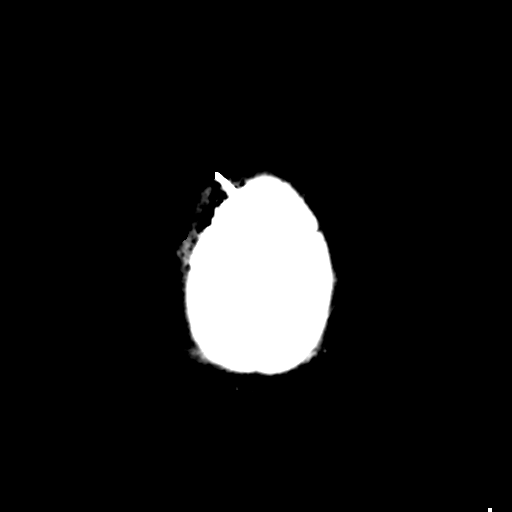

[Series 4: head bone · axial · 0.44mm/px · z∈[-403,-365]mm · 3 of 95 slices shown]
[im 10/95  bone]
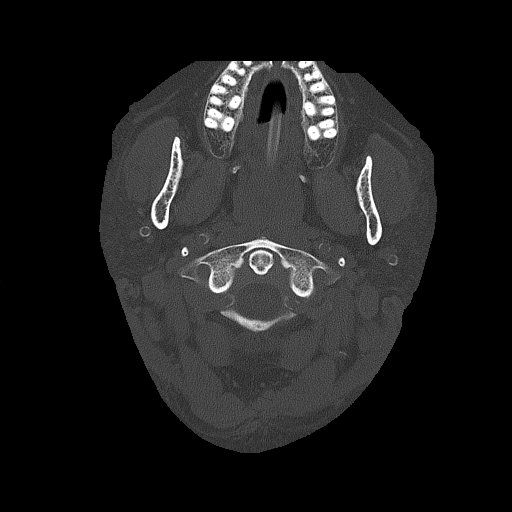
[im 19/95  bone]
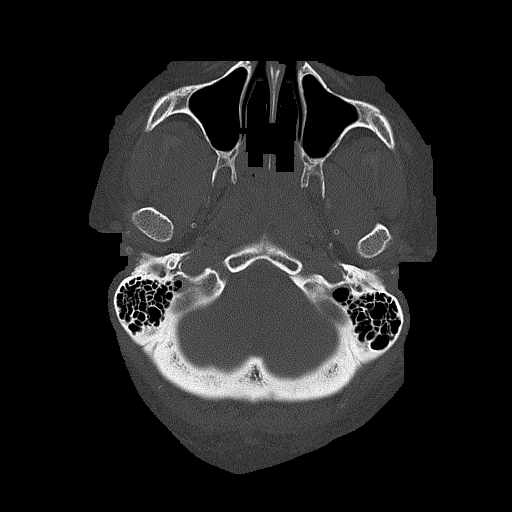
[im 29/95  bone]
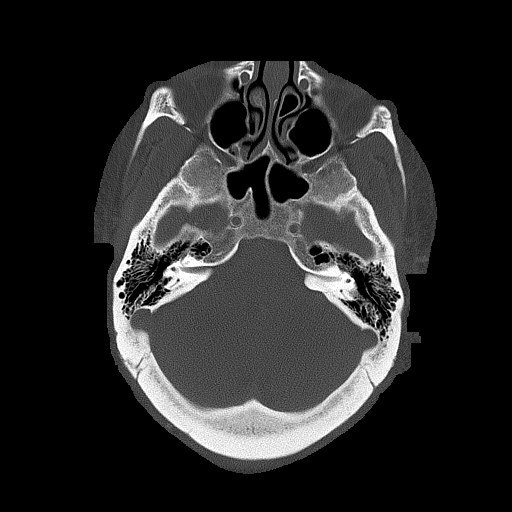

[Series 5: head without cor · coronal · non-contrast · 0.37mm/px · 3 of 71 slices shown]
[im 24/71  brain]
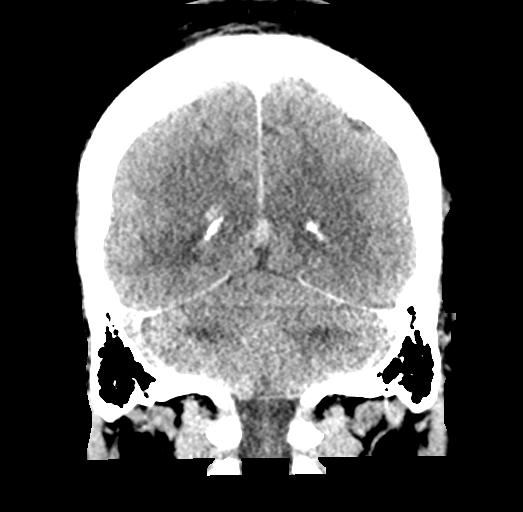
[im 32/71  brain]
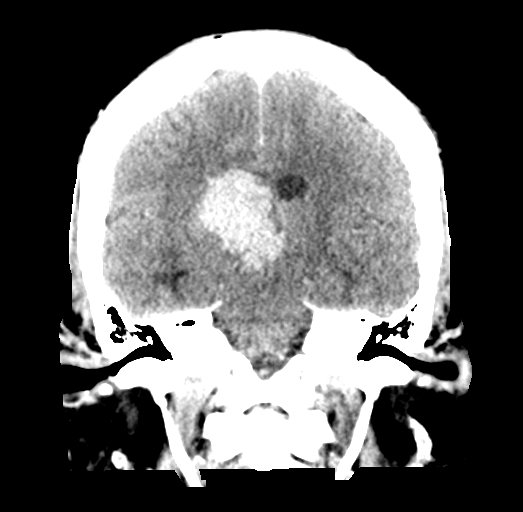
[im 39/71  brain]
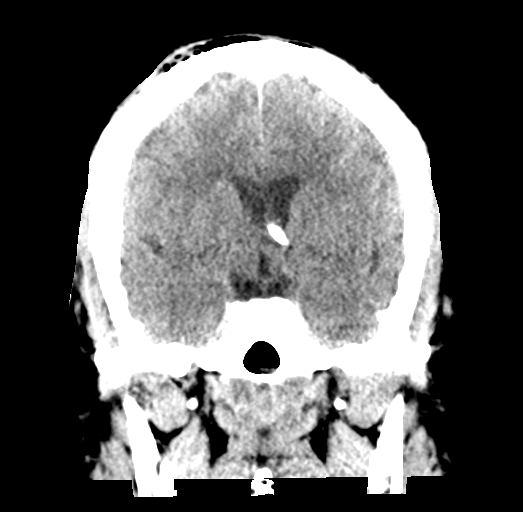

[Series 6: head without sag · sagittal · non-contrast · 0.37mm/px · 3 of 67 slices shown]
[im 23/67  brain]
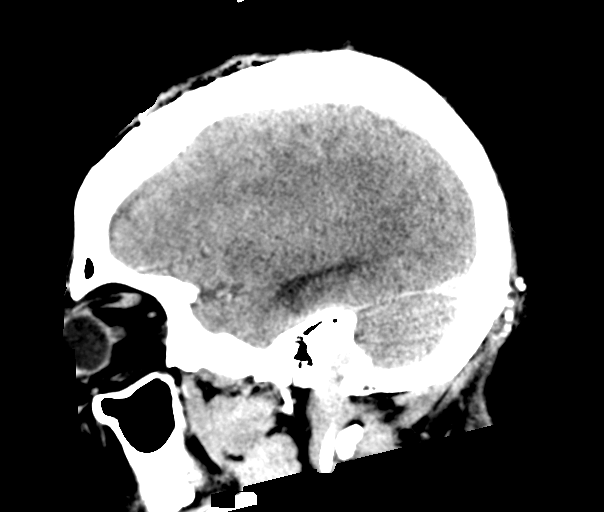
[im 34/67  brain]
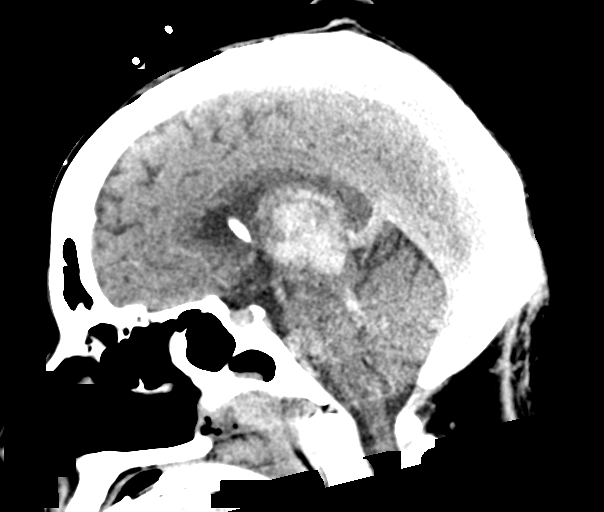
[im 45/67  brain]
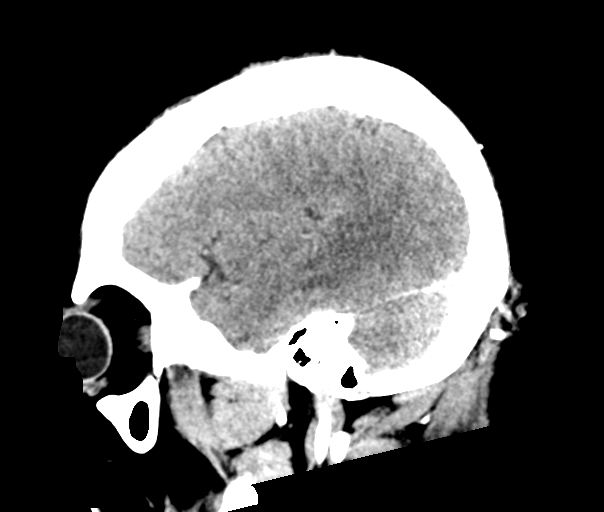

[16 of 47 positions shown; findings below may reference images not displayed]

FINDINGS: Brain: Interval placement of a right frontal approach
ventriculostomy catheter, which terminates in the left basal
ganglia. Interval slight decrease in the size of the lateral
ventricles and third ventricle. Small amount of pneumocephalus along
the right frontal convexity, not unexpected post EVD placement.

Redemonstrated acute hematoma in the right thalamus, which measures
3.7 x 2.3 x 3.8 cm (AP x TR x CC) (series 3, image 21 and series 5,
image 41), previously 3.6 x 2.5 x 3.8 cm, overall unchanged compared
to earlier on the same day. Again noted is a small amount
intraventricular hemorrhage in the cerebral aqueduct and fourth
ventricle as well as layering in the occipital horns. Unchanged mass
effect on the right lateral ventricle and third ventricle.

Vascular: No hyperdense vessel. Atherosclerotic calcifications in
the intracranial carotid and vertebral arteries.

Skull: Right frontal burr hole. Negative for fracture or focal
lesion.

Sinuses/Orbits: No acute finding.

Other: Air within the right frontal soft tissues, not unexpected
postoperatively.
IMPRESSION: 1. Status post interval placement of a right frontal approach
ventriculostomy catheter, with slight interval decrease in the size
of the lateral and third ventricles.
2. Overall unchanged size of the right thalamic hematoma compared to
earlier on the same day.

## 2021-08-23 MED ORDER — SODIUM CHLORIDE 3 % IV SOLN
INTRAVENOUS | Status: DC
  Filled 2021-08-23 (×9): qty 500

## 2021-08-23 MED ORDER — ETOMIDATE 2 MG/ML IV SOLN
INTRAVENOUS | Status: AC
  Administered 2021-08-23: 20 mg
  Filled 2021-08-23: qty 20

## 2021-08-23 MED ORDER — ORAL CARE MOUTH RINSE
15.0000 mL | OROMUCOSAL | Status: DC
  Administered 2021-08-24 – 2021-08-26 (×26): 15 mL via OROMUCOSAL

## 2021-08-23 MED ORDER — ACETAMINOPHEN 160 MG/5ML PO SOLN
650.0000 mg | ORAL | Status: DC | PRN
  Administered 2021-08-28 – 2021-10-02 (×29): 650 mg
  Filled 2021-08-23 (×31): qty 20.3

## 2021-08-23 MED ORDER — NICARDIPINE HCL IN NACL 20-0.86 MG/200ML-% IV SOLN
INTRAVENOUS | Status: AC
  Administered 2021-08-23: 5 mg/h via INTRAVENOUS
  Filled 2021-08-23: qty 200

## 2021-08-23 MED ORDER — SODIUM CHLORIDE 3 % IV BOLUS
250.0000 mL | Freq: Once | INTRAVENOUS | Status: AC
  Administered 2021-08-23: 250 mL via INTRAVENOUS
  Filled 2021-08-23: qty 500

## 2021-08-23 MED ORDER — FENTANYL CITRATE PF 50 MCG/ML IJ SOSY
100.0000 ug | PREFILLED_SYRINGE | Freq: Once | INTRAMUSCULAR | Status: AC
  Administered 2021-08-23: 100 ug via INTRAVENOUS

## 2021-08-23 MED ORDER — STROKE: EARLY STAGES OF RECOVERY BOOK
Freq: Once | Status: AC
  Filled 2021-08-23 (×2): qty 1

## 2021-08-23 MED ORDER — ACETAMINOPHEN 325 MG PO TABS
650.0000 mg | ORAL_TABLET | ORAL | Status: DC | PRN
  Administered 2021-09-20 – 2021-09-26 (×3): 650 mg via ORAL
  Filled 2021-08-23 (×6): qty 2

## 2021-08-23 MED ORDER — FENTANYL CITRATE PF 50 MCG/ML IJ SOSY
PREFILLED_SYRINGE | INTRAMUSCULAR | Status: AC
  Filled 2021-08-23: qty 2

## 2021-08-23 MED ORDER — FENTANYL CITRATE PF 50 MCG/ML IJ SOSY
50.0000 ug | PREFILLED_SYRINGE | INTRAMUSCULAR | Status: AC | PRN
  Administered 2021-08-23 – 2021-08-25 (×3): 50 ug via INTRAVENOUS
  Filled 2021-08-23 (×4): qty 1

## 2021-08-23 MED ORDER — FENTANYL CITRATE (PF) 100 MCG/2ML IJ SOLN: 50.0000 ug | INTRAMUSCULAR | Status: DC | PRN

## 2021-08-23 MED ORDER — ONDANSETRON HCL 4 MG/2ML IJ SOLN
4.0000 mg | Freq: Three times a day (TID) | INTRAMUSCULAR | Status: DC | PRN
  Administered 2021-09-01: 4 mg via INTRAVENOUS
  Filled 2021-08-23 (×2): qty 2

## 2021-08-23 MED ORDER — ROCURONIUM BROMIDE 10 MG/ML (PF) SYRINGE
PREFILLED_SYRINGE | INTRAVENOUS | Status: AC
  Administered 2021-08-23: 100 mg
  Filled 2021-08-23: qty 10

## 2021-08-23 MED ORDER — SODIUM ZIRCONIUM CYCLOSILICATE 10 G PO PACK
10.0000 g | PACK | Freq: Three times a day (TID) | ORAL | Status: AC
  Administered 2021-08-23: 10 g
  Filled 2021-08-23 (×2): qty 1

## 2021-08-23 MED ORDER — PIPERACILLIN-TAZOBACTAM IN DEX 2-0.25 GM/50ML IV SOLN
2.2500 g | Freq: Three times a day (TID) | INTRAVENOUS | Status: AC
  Administered 2021-08-23 – 2021-08-28 (×15): 2.25 g via INTRAVENOUS
  Filled 2021-08-23 (×16): qty 50

## 2021-08-23 MED ORDER — NICARDIPINE HCL IN NACL 20-0.86 MG/200ML-% IV SOLN: 3.0000 mg/h | INTRAVENOUS | Status: DC

## 2021-08-23 MED ORDER — CHLORHEXIDINE GLUCONATE 0.12% ORAL RINSE (MEDLINE KIT)
15.0000 mL | Freq: Two times a day (BID) | OROMUCOSAL | Status: DC
  Administered 2021-08-23 – 2021-08-26 (×6): 15 mL via OROMUCOSAL

## 2021-08-23 MED ORDER — FENTANYL CITRATE PF 50 MCG/ML IJ SOSY
50.0000 ug | PREFILLED_SYRINGE | INTRAMUSCULAR | Status: DC | PRN
  Administered 2021-08-25 (×2): 50 ug via INTRAVENOUS
  Filled 2021-08-23: qty 4

## 2021-08-23 MED ORDER — CHLORHEXIDINE GLUCONATE CLOTH 2 % EX PADS
6.0000 | MEDICATED_PAD | Freq: Every day | CUTANEOUS | Status: DC
Start: 2021-08-24 — End: 2021-08-25
  Administered 2021-08-24: 6 via TOPICAL

## 2021-08-23 MED ORDER — ROCURONIUM BROMIDE 10 MG/ML (PF) SYRINGE
PREFILLED_SYRINGE | INTRAVENOUS | Status: AC
  Administered 2021-08-23: 50 mg
  Filled 2021-08-23: qty 10

## 2021-08-23 MED ORDER — PROPOFOL 1000 MG/100ML IV EMUL
INTRAVENOUS | Status: AC
  Filled 2021-08-23: qty 100

## 2021-08-23 MED ORDER — ETOMIDATE 2 MG/ML IV SOLN
INTRAVENOUS | Status: AC
  Filled 2021-08-23: qty 20

## 2021-08-23 MED ORDER — PANTOPRAZOLE SODIUM 40 MG IV SOLR
40.0000 mg | Freq: Every day | INTRAVENOUS | Status: DC
  Administered 2021-08-23: 40 mg via INTRAVENOUS
  Filled 2021-08-23: qty 10

## 2021-08-23 MED ORDER — MIDAZOLAM HCL 2 MG/2ML IJ SOLN
INTRAMUSCULAR | Status: AC
  Filled 2021-08-23: qty 2

## 2021-08-23 MED ORDER — KETAMINE HCL 50 MG/5ML IJ SOSY
PREFILLED_SYRINGE | INTRAMUSCULAR | Status: AC
  Filled 2021-08-23: qty 5

## 2021-08-23 MED ORDER — SENNOSIDES-DOCUSATE SODIUM 8.6-50 MG PO TABS
1.0000 | ORAL_TABLET | Freq: Two times a day (BID) | ORAL | Status: DC
  Administered 2021-08-23 – 2021-08-26 (×5): 1
  Filled 2021-08-23 (×6): qty 1

## 2021-08-23 MED ORDER — ONDANSETRON HCL 4 MG/2ML IJ SOLN
INTRAMUSCULAR | Status: AC
  Administered 2021-08-23: 4 mg via INTRAVENOUS
  Filled 2021-08-23: qty 2

## 2021-08-23 MED ORDER — ACETAMINOPHEN 650 MG RE SUPP
650.0000 mg | RECTAL | Status: DC | PRN
  Administered 2021-08-26 – 2021-08-27 (×2): 650 mg via RECTAL
  Filled 2021-08-23 (×2): qty 1

## 2021-08-23 MED ORDER — CLEVIDIPINE BUTYRATE 0.5 MG/ML IV EMUL
0.0000 mg/h | INTRAVENOUS | Status: DC
  Administered 2021-08-23: 2 mg/h via INTRAVENOUS
  Filled 2021-08-23: qty 50

## 2021-08-23 MED ORDER — PROPOFOL 1000 MG/100ML IV EMUL: 5.0000 ug/kg/min | INTRAVENOUS | Status: DC

## 2021-08-23 MED ORDER — SUCCINYLCHOLINE CHLORIDE 200 MG/10ML IV SOSY
PREFILLED_SYRINGE | INTRAVENOUS | Status: AC
  Filled 2021-08-23: qty 10

## 2021-08-23 MED ORDER — PROPOFOL 1000 MG/100ML IV EMUL
INTRAVENOUS | Status: AC
  Administered 2021-08-23: 20 ug/kg/min via INTRAVENOUS
  Filled 2021-08-23: qty 100

## 2021-08-23 MED ORDER — INSULIN ASPART 100 UNIT/ML IJ SOLN
0.0000 [IU] | INTRAMUSCULAR | Status: DC
  Administered 2021-08-23 – 2021-08-28 (×9): 1 [IU] via SUBCUTANEOUS
  Administered 2021-08-29: 2 [IU] via SUBCUTANEOUS
  Administered 2021-08-29 – 2021-09-01 (×12): 1 [IU] via SUBCUTANEOUS
  Administered 2021-09-01 (×2): 2 [IU] via SUBCUTANEOUS
  Administered 2021-09-01 (×3): 1 [IU] via SUBCUTANEOUS
  Administered 2021-09-02: 3 [IU] via SUBCUTANEOUS
  Administered 2021-09-02: 2 [IU] via SUBCUTANEOUS
  Administered 2021-09-02 (×2): 3 [IU] via SUBCUTANEOUS
  Administered 2021-09-03 (×4): 1 [IU] via SUBCUTANEOUS
  Administered 2021-09-04: 2 [IU] via SUBCUTANEOUS
  Administered 2021-09-04: 3 [IU] via SUBCUTANEOUS
  Administered 2021-09-04 (×2): 1 [IU] via SUBCUTANEOUS
  Administered 2021-09-04 – 2021-09-05 (×4): 2 [IU] via SUBCUTANEOUS
  Administered 2021-09-05 – 2021-09-06 (×5): 1 [IU] via SUBCUTANEOUS

## 2021-08-23 MED ORDER — CLEVIDIPINE BUTYRATE 0.5 MG/ML IV EMUL
0.0000 mg/h | INTRAVENOUS | Status: DC
  Administered 2021-08-23: 8 mg/h via INTRAVENOUS
  Administered 2021-08-23: 6 mg/h via INTRAVENOUS
  Administered 2021-08-23: 10 mg/h via INTRAVENOUS
  Administered 2021-08-23: 2 mg/h via INTRAVENOUS
  Administered 2021-08-24: 13 mg/h via INTRAVENOUS
  Administered 2021-08-24: 7 mg/h via INTRAVENOUS
  Administered 2021-08-24: 10 mg/h via INTRAVENOUS
  Administered 2021-08-24 (×3): 13 mg/h via INTRAVENOUS
  Administered 2021-08-24: 7 mg/h via INTRAVENOUS
  Administered 2021-08-25 (×2): 8 mg/h via INTRAVENOUS
  Administered 2021-08-25 (×2): 13 mg/h via INTRAVENOUS
  Administered 2021-08-25: 8 mg/h via INTRAVENOUS
  Administered 2021-08-25: 12 mg/h via INTRAVENOUS
  Administered 2021-08-25: 10 mg/h via INTRAVENOUS
  Administered 2021-08-25 (×2): 13 mg/h via INTRAVENOUS
  Administered 2021-08-26 (×2): 8 mg/h via INTRAVENOUS
  Filled 2021-08-23 (×3): qty 50
  Filled 2021-08-23: qty 100
  Filled 2021-08-23 (×11): qty 50
  Filled 2021-08-23: qty 100
  Filled 2021-08-23 (×3): qty 50
  Filled 2021-08-23: qty 100
  Filled 2021-08-23 (×5): qty 50

## 2021-08-23 MED ORDER — IOHEXOL 350 MG/ML SOLN
125.0000 mL | Freq: Once | INTRAVENOUS | Status: AC | PRN
  Administered 2021-08-23: 100 mL via INTRAVENOUS

## 2021-08-23 MED ORDER — SENNOSIDES-DOCUSATE SODIUM 8.6-50 MG PO TABS
1.0000 | ORAL_TABLET | Freq: Two times a day (BID) | ORAL | Status: DC
Start: 2021-08-23 — End: 2021-08-23

## 2021-08-23 MED ORDER — DEXMEDETOMIDINE HCL IN NACL 400 MCG/100ML IV SOLN
0.0000 ug/kg/h | INTRAVENOUS | Status: DC
  Administered 2021-08-23 (×2): 0.4 ug/kg/h via INTRAVENOUS
  Administered 2021-08-24: 1 ug/kg/h via INTRAVENOUS
  Administered 2021-08-24: 0.4 ug/kg/h via INTRAVENOUS
  Administered 2021-08-24: 1 ug/kg/h via INTRAVENOUS
  Administered 2021-08-24: 0.9 ug/kg/h via INTRAVENOUS
  Administered 2021-08-25: 1.2 ug/kg/h via INTRAVENOUS
  Administered 2021-08-25: 1.1 ug/kg/h via INTRAVENOUS
  Administered 2021-08-25: 1 ug/kg/h via INTRAVENOUS
  Administered 2021-08-25: 0.6 ug/kg/h via INTRAVENOUS
  Administered 2021-08-25: 0.9 ug/kg/h via INTRAVENOUS
  Administered 2021-08-25: 1 ug/kg/h via INTRAVENOUS
  Administered 2021-08-26: 0.7 ug/kg/h via INTRAVENOUS
  Administered 2021-08-26: 0.9 ug/kg/h via INTRAVENOUS
  Filled 2021-08-23 (×14): qty 100

## 2021-08-23 NOTE — Progress Notes (Signed)
  Transition of Care [facility]) Screening Note   Patient Details  Name: Janet Mitchell Date of Birth: April 08, 1965   Transition of Care [facility]) CM/SW Contact:    Cyndi Bender, RN Phone Number: 08/23/2021, 1:38 PM    Transition of Care Department [facility]) has reviewed patient and no TOC needs have been identified at this time. We will continue to monitor patient advancement through interdisciplinary progression rounds. If new patient transition needs arise, please place a TOC consult.

## 2021-08-23 NOTE — ED Triage Notes (Signed)
Pt brought in code stroke. Pt with left side flaccid and slurred speech. LKN midnight.

## 2021-08-23 NOTE — Consult Note (Signed)
Reason for Consult: Hydrocephalus Referring Physician: Amie Portland, MD  HPI: Janet Mitchell is an 56 y.o. female significant for ESRD on HD MWF, CHF, HTN, DM 2 who presented to the [facility] emergency department and code stroke fashion due to complaints of nausea and vomiting and left sided hemiparesis.  It was noted that she was last seen normal at midnight.  While at [facility] she was able to have a conversation however, while she was awaiting transfer to [facility] she had an acute decline in her respiratory status and required intubation for airway protection.  A CT head was performed and revealed a right thalamus with intraventricular extension.  CTA head and neck was performed and revealed no acute findings.  She was started on a Cleviprex drip for strict blood pressure control and transferred to [facility] for continued management.  Repeat CT head was obtained and revealed an increase in the hemorrhage with developing hydrocephalus.  Neurosurgery consult was requested to evaluate the patient for potential extraventricular drain.  Past Medical History:  Diagnosis Date   Abdominal wall hematoma 12/26/2019   Anemia    Arthritis    CHF (congestive heart failure) ([place])    pt states she has been cleared of heart failure/disease   Chronic cholecystitis with calculus    COVID-19 03/26/2019   COVID-19 virus infection 03/2019   ESRD (end stage renal disease) on dialysis [facility])    Dialysis M-W-F   Headache    "a few/wk" (03/09/2018) - no longer having these   History of blood transfusion 10/2017   "low blood count" (03/09/2018)   Hypertension    Seasonal allergies    Spinal headache    Type II diabetes mellitus ([place])     Past Surgical History:  Procedure Laterality Date   AMPUTATION TOE Left 2013   Great toe   AV FISTULA PLACEMENT Left 07/22/2019   Procedure: LEFT ARM BASILIC ARTERIOVENOUS (AV) FISTULA CREATION;  Surgeon: Rosetta Posner, MD;  Location: [place];  Service: Vascular;   Laterality: Left;   [place] Left 10/17/2019   Procedure: LEFT SECOND STAGE [place];  Surgeon: Rosetta Posner, MD;  Location: [place];  Service: Vascular;  Laterality: Left;   BIOPSY  10/24/2018   Procedure: BIOPSY;  Surgeon: Daneil Dolin, MD;  [facility];  Service: Endoscopy;;  right and left colon   CATARACT EXTRACTION W/ INTRAOCULAR LENS IMPLANT Right    [place]; [place] N/A 03/09/2018   Procedure: LAPAROSCOPIC CHOLECYSTECTOMY WITH INTRAOPERATIVE CHOLANGIOGRAM ERAS PATHWAY;  Surgeon: Donnie Mesa, MD;  Location: [place];  Service: General;  Laterality: N/A;   COLONOSCOPY N/A 10/24/2018   Procedure: COLONOSCOPY;  Surgeon: Daneil Dolin, MD;  [facility];  Service: Endoscopy;  Laterality: N/A;  1:00pm   EYE SURGERY Left 05/15/2018   Removal of blood in the globe (due to DM)   FLEXIBLE SIGMOIDOSCOPY N/A 11/24/2017   Procedure: FLEXIBLE SIGMOIDOSCOPY;  Surgeon: Daneil Dolin, MD;  [facility];  Service: Endoscopy;  Laterality: N/A;   IR FLUORO GUIDE CV LINE RIGHT  07/17/2019   IR PARACENTESIS  07/09/2019   IR US GUIDE VASC ACCESS RIGHT  07/17/2019   LAPAROSCOPIC CHOLECYSTECTOMY  03/09/2018   PERITONEAL CATHETER INSERTION  2017   POLYPECTOMY  10/24/2018   Procedure: POLYPECTOMY;  Surgeon: Daneil Dolin, MD;  [facility];  Service: Endoscopy;;   TOTAL HIP ARTHROPLASTY Left 1997  Family History  Problem Relation Age of Onset   Heart disease Mother    Thrombocytopenia Mother        TTP   Heart failure Father    Kidney disease Paternal Grandfather    Colon cancer Neg Hx     Social History:  reports that she has never smoked. She has been exposed to tobacco smoke. She has never used smokeless tobacco. She reports that she does not drink alcohol and does not use drugs.  Allergies: No Known Allergies  Medications: I have reviewed the patient's current  medications.  Results for orders placed or performed during the hospital encounter of 08/23/21 (from the past 48 hour(s))  Ethanol     Status: None   Collection Time: 08/23/21  6:32 AM  Result Value Ref Range   Alcohol, Ethyl (B) <10 <10 mg/dL    Comment: (NOTE) Lowest detectable limit for serum alcohol is 10 mg/dL.  For medical purposes only. Performed at [facility], [address], [place], [place] [address]   Protime-INR     Status: None   Collection Time: 08/23/21  6:32 AM  Result Value Ref Range   Prothrombin Time 14.7 11.4 - 15.2 seconds   INR 1.2 0.8 - 1.2    Comment: (NOTE) INR goal varies based on device and disease states. Performed at [facility], [address] [place], [place] [address]   APTT     Status: None   Collection Time: 08/23/21  6:32 AM  Result Value Ref Range   aPTT 35 24 - 36 seconds    Comment: Performed at [facility], [address], [place], [place] [address]  CBC     Status: Abnormal   Collection Time: 08/23/21  6:32 AM  Result Value Ref Range   WBC 4.0 4.0 - 10.5 K/uL   RBC 4.42 3.87 - 5.11 MIL/uL   Hemoglobin 10.9 (L) 12.0 - 15.0 g/dL   HCT 35.8 (L) 36.0 - 46.0 %   MCV 81.0 80.0 - 100.0 fL   MCH 24.7 (L) 26.0 - 34.0 pg   MCHC 30.4 30.0 - 36.0 g/dL   RDW 17.7 (H) 11.5 - 15.5 %   Platelets 90 (L) 150 - 400 K/uL    Comment: SPECIMEN CHECKED FOR CLOTS Immature Platelet Fraction may be clinically indicated, consider ordering this additional test [id number] PLATELET COUNT CONFIRMED BY SMEAR    nRBC 0.0 0.0 - 0.2 %    Comment: Performed at [facility], [address], [place], [place] [address]  Differential     Status: None   Collection Time: 08/23/21  6:32 AM  Result Value Ref Range   Neutrophils Relative % 54 %   Neutro Abs 2.2 1.7 - 7.7 K/uL   Lymphocytes Relative 28 %   Lymphs Abs 1.1 0.7 - 4.0 K/uL   Monocytes Relative 10 %   Monocytes Absolute 0.4 0.1 - 1.0 K/uL   Eosinophils Relative 7 %   Eosinophils Absolute 0.3 0.0 - 0.5  K/uL   Basophils Relative 1 %   Basophils Absolute 0.0 0.0 - 0.1 K/uL   WBC Morphology MORPHOLOGY UNREMARKABLE    RBC Morphology MORPHOLOGY UNREMARKABLE    Immature Granulocytes 0 %   Abs Immature Granulocytes 0.01 0.00 - 0.07 K/uL    Comment: Performed at [facility], [address], [place], [place] [address]  Comprehensive metabolic panel     Status: Abnormal   Collection Time: 08/23/21  6:32 AM  Result Value Ref Range  Sodium 139 135 - 145 mmol/L   Potassium 5.2 (H) 3.5 - 5.1 mmol/L   Chloride 99 98 - 111 mmol/L   CO2 29 22 - 32 mmol/L   Glucose, Bld 131 (H) 70 - 99 mg/dL    Comment: Glucose reference range applies only to samples taken after fasting for at least 8 hours.   BUN 76 (H) 6 - 20 mg/dL   Creatinine, Ser 9.25 (H) 0.44 - 1.00 mg/dL   Calcium 8.5 (L) 8.9 - 10.3 mg/dL   Total Protein 7.5 6.5 - 8.1 g/dL   Albumin 3.8 3.5 - 5.0 g/dL   AST 19 15 - 41 U/L   ALT 15 0 - 44 U/L   Alkaline Phosphatase 93 38 - 126 U/L   Total Bilirubin 0.1 (L) 0.3 - 1.2 mg/dL   GFR, Estimated 5 (L) >60 mL/min    Comment: (NOTE) Calculated using the CKD-EPI Creatinine Equation (2021)    Anion gap 11 5 - 15    Comment: Performed at [facility], [address] [place], [place] [address]  I-stat chem 8, ED     Status: Abnormal   Collection Time: 08/23/21  6:40 AM  Result Value Ref Range   Sodium 140 135 - 145 mmol/L   Potassium 5.3 (H) 3.5 - 5.1 mmol/L   Chloride 99 98 - 111 mmol/L   BUN 74 (H) 6 - 20 mg/dL   Creatinine, Ser 10.60 (H) 0.44 - 1.00 mg/dL   Glucose, Bld 129 (H) 70 - 99 mg/dL    Comment: Glucose reference range applies only to samples taken after fasting for at least 8 hours.   Calcium, Ion 1.03 (L) 1.15 - 1.40 mmol/L   TCO2 34 (H) 22 - 32 mmol/L   Hemoglobin 12.9 12.0 - 15.0 g/dL   HCT 38.0 36.0 - 46.0 %  Resp Panel by RT-PCR (Flu A&B, Covid) Anterior Nasal Swab     Status: None   Collection Time: 08/23/21  7:06 AM   Specimen: Anterior Nasal Swab  Result Value Ref Range    SARS Coronavirus 2 by RT PCR NEGATIVE NEGATIVE    Comment: (NOTE) SARS-CoV-2 target nucleic acids are NOT DETECTED.  The SARS-CoV-2 RNA is generally detectable in upper respiratory specimens during the acute phase of infection. The lowest concentration of SARS-CoV-2 viral copies this assay can detect is 138 copies/mL. A negative result does not preclude SARS-Cov-2 infection and should not be used as the sole basis for treatment or other patient management decisions. A negative result may occur with  improper specimen collection/handling, submission of specimen other than nasopharyngeal swab, presence of viral mutation(s) within the areas targeted by this assay, and inadequate number of viral copies(<138 copies/mL). A negative result must be combined with clinical observations, patient history, and epidemiological information. The expected result is Negative.  Fact Sheet for Patients:  EntrepreneurPulse.com.au  Fact Sheet for Healthcare Providers:  IncredibleEmployment.be  This test is no t yet approved or cleared by the [place] FDA and  has been authorized for detection and/or diagnosis of SARS-CoV-2 by FDA under an Emergency Use Authorization (EUA). This EUA will remain  in effect (meaning this test can be used) for the duration of the COVID-19 declaration under Section 564(b)(1) of the Act, 21 U.S.C.section 360bbb-3(b)(1), unless the authorization is terminated  or revoked sooner.       Influenza A by PCR NEGATIVE NEGATIVE   Influenza B by PCR NEGATIVE NEGATIVE    Comment: (NOTE) The Xpert Xpress SARS-CoV-2/FLU/RSV  plus assay is intended as an aid in the diagnosis of influenza from Nasopharyngeal swab specimens and should not be used as a sole basis for treatment. Nasal washings and aspirates are unacceptable for Xpert Xpress SARS-CoV-2/FLU/RSV testing.  Fact Sheet for Patients: EntrepreneurPulse.com.au  Fact  Sheet for Healthcare Providers: IncredibleEmployment.be  This test is not yet approved or cleared by the [place] FDA and has been authorized for detection and/or diagnosis of SARS-CoV-2 by FDA under an Emergency Use Authorization (EUA). This EUA will remain in effect (meaning this test can be used) for the duration of the COVID-19 declaration under Section 564(b)(1) of the Act, 21 U.S.C. section 360bbb-3(b)(1), unless the authorization is terminated or revoked.  Performed at [facility], [address] [place], [place] [address]   CBG monitoring, ED     Status: Abnormal   Collection Time: 08/23/21  7:28 AM  Result Value Ref Range   Glucose-Capillary 150 (H) 70 - 99 mg/dL    Comment: Glucose reference range applies only to samples taken after fasting for at least 8 hours.  Glucose, capillary     Status: Abnormal   Collection Time: 08/23/21  9:08 AM  Result Value Ref Range   Glucose-Capillary 153 (H) 70 - 99 mg/dL    Comment: Glucose reference range applies only to samples taken after fasting for at least 8 hours.  I-STAT 7, (LYTES, BLD GAS, ICA, H+H)     Status: Abnormal   Collection Time: 08/23/21  9:26 AM  Result Value Ref Range   pH, Arterial 7.421 7.35 - 7.45   pCO2 arterial 48.1 (H) 32 - 48 mmHg   pO2, Arterial 95 83 - 108 mmHg   Bicarbonate 31.3 (H) 20.0 - 28.0 mmol/L   TCO2 33 (H) 22 - 32 mmol/L   O2 Saturation 97 %   Acid-Base Excess 6.0 (H) 0.0 - 2.0 mmol/L   Sodium 137 135 - 145 mmol/L   Potassium 5.7 (H) 3.5 - 5.1 mmol/L   Calcium, Ion 1.07 (L) 1.15 - 1.40 mmol/L   HCT 37.0 36.0 - 46.0 %   Hemoglobin 12.6 12.0 - 15.0 g/dL   Collection site RADIAL, ALLEN'S TEST ACCEPTABLE    Drawn by RT    Sample type ARTERIAL   HIV Antibody (routine testing w rflx)     Status: None   Collection Time: 08/23/21 10:20 AM  Result Value Ref Range   HIV Screen 4th Generation wRfx Non Reactive Non Reactive    Comment: Performed at [facility], [address], [place], [place] [address]  Lipid panel     Status: Abnormal   Collection Time: 08/23/21 10:20 AM  Result Value Ref Range   Cholesterol 178 0 - 200 mg/dL   Triglycerides 383 (H) <150 mg/dL   HDL 63 >40 mg/dL   Total CHOL/HDL Ratio 2.8 RATIO   VLDL 77 (H) 0 - 40 mg/dL   LDL Cholesterol 38 0 - 99 mg/dL    Comment:        Total Cholesterol/HDL:CHD Risk Coronary Heart Disease Risk Table                     Men   Women  1/2 Average Risk   3.4   3.3  Average Risk       5.0   4.4  2 X Average Risk   9.6   7.1  3 X Average Risk  23.4   11.0  Use the calculated Patient Ratio above and the CHD Risk Table to determine the patient's CHD Risk.        ATP III CLASSIFICATION (LDL):  <100     mg/dL   Optimal  100-129  mg/dL   Near or Above                    Optimal  130-159  mg/dL   Borderline  160-189  mg/dL   High  >190     mg/dL   Very High Performed at [facility] [address] [place], [place] [address]   Hemoglobin A1c     Status: Abnormal   Collection Time: 08/23/21 10:20 AM  Result Value Ref Range   Hgb A1c MFr Bld 6.2 (H) 4.8 - 5.6 %    Comment: (NOTE) Pre diabetes:          5.7%-6.4%  Diabetes:              >6.4%  Glycemic control for   <7.0% adults with diabetes    Mean Plasma Glucose 131.24 mg/dL    Comment: Performed at [facility] [address], [place], [place] [address]  Phosphorus     Status: Abnormal   Collection Time: 08/23/21 10:20 AM  Result Value Ref Range   Phosphorus 5.3 (H) 2.5 - 4.6 mg/dL    Comment: Performed at [facility] [address] [place], [place] [address]  Glucose, capillary     Status: Abnormal   Collection Time: 08/23/21 11:25 AM  Result Value Ref Range   Glucose-Capillary 159 (H) 70 - 99 mg/dL    Comment: Glucose reference range applies only to samples taken after fasting for at least 8 hours.  Glucose, capillary     Status: Abnormal   Collection Time: 08/23/21  1:48 PM  Result Value Ref Range    Glucose-Capillary 162 (H) 70 - 99 mg/dL    Comment: Glucose reference range applies only to samples taken after fasting for at least 8 hours.    CT ANGIO HEAD W OR WO CONTRAST  Result Date: 08/23/2021 CLINICAL DATA:  Code stroke for left-sided weakness EXAM: CT ANGIOGRAPHY HEAD AND NECK TECHNIQUE: Multidetector CT imaging of the head and neck was performed using the standard protocol during bolus administration of intravenous contrast. Multiplanar CT image reconstructions and MIPs were obtained to evaluate the vascular anatomy. Carotid stenosis measurements (when applicable) are obtained utilizing NASCET criteria, using the distal internal carotid diameter as the denominator. RADIATION DOSE REDUCTION: This exam was performed according to the departmental dose-optimization program which includes automated exposure control, adjustment of the mA and/or kV according to patient size and/or use of iterative reconstruction technique. CONTRAST:  160m OMNIPAQUE IOHEXOL 350 MG/ML SOLN COMPARISON:  Noncontrast head CT from earlier today FINDINGS: CTA NECK FINDINGS Aortic arch: No acute finding Right carotid system: Mild atheromatous plaque at the bifurcation. No stenosis or ulceration. Left carotid system: Mild calcified plaque at the bifurcation. No stenosis or ulceration. Vertebral arteries: Proximal subclavian atherosclerosis without flow reducing stenosis. Codominant vertebral arteries there are smoothly contoured and widely patent to the dura. Skeleton: No acute or aggressive finding Other neck: No acute finding Upper chest: Fine interstitial opacity at the apices symmetrically. Review of the MIP images confirms the above findings CTA HEAD FINDINGS Anterior circulation: Calcified plaque along the carotid siphons. No occlusion, beading, or proximal flow limiting stenosis. Negative for aneurysm. Posterior circulation: Calcified plaque along the V4 segments.  No branch occlusion, beading, or aneurysm. No evidence of  vascular malformation. Equivocal for spot sign in the lower aspect of the hematoma. Venous sinuses: Diffusely patent Anatomic variants: None significant Review of the MIP images confirms the above findings IMPRESSION: 1. No arterial lesion underlying the right thalamic hematoma. Equivocal for spot sign which would be an adverse finding suggesting continued growth. 2. Atherosclerosis without flow limiting stenosis of major vessels. 3. Interstitial opacity at the apices, age-indeterminate. Recommend chest radiograph. Electronically Signed   By: Jorje Guild M.D.   On: 08/23/2021 07:12   CT HEAD WO CONTRAST (5MM)  Result Date: 08/23/2021 CLINICAL DATA:  Stroke, hemorrhagic EXAM: CT HEAD WITHOUT CONTRAST TECHNIQUE: Contiguous axial images were obtained from the base of the skull through the vertex without intravenous contrast. RADIATION DOSE REDUCTION: This exam was performed according to the departmental dose-optimization program which includes automated exposure control, adjustment of the mA and/or kV according to patient size and/or use of iterative reconstruction technique. COMPARISON:  CT head May 29, 23. FINDINGS: Brain: Significantly increased size of a an acute hematoma in the right thalamus, now measuring 3.6 x 2.5 x 3.8 Cm (previously 2.9 x 1.7 x 3.1 cm). Small volume intraventricular extension of hemorrhage with hemorrhage noted in the cerebral aqueduct and fourth ventricle. Regional mass effect with effacement of the third ventricle. No evidence of acute large vascular territory infarct. Development of rounding of the temporal horns, compatible with mild hydrocephalus. Vascular: Residual contrast within the vascular system. Calcific atherosclerosis Skull: No acute fracture. Sinuses/Orbits: Mild paranasal sinus mucosal thickening. No acute orbital findings. Other: No mastoid effusions. IMPRESSION: 1. Interval significant increase in size of a an acute hematoma in the right thalamus, now measuring 3.6 x  2.5 x 3.8 cm (previously 2.9 x 1.7 x 3.1 cm). Small volume intraventricular extension of hemorrhage. 2. Interval development of mild hydrocephalus. These results will be called to the ordering clinician or representative by the Radiologist Assistant, and communication documented in the PACS or [employer]. Electronically Signed   By: Margaretha Sheffield M.D.   On: 08/23/2021 10:08   CT ANGIO NECK W OR WO CONTRAST  Result Date: 08/23/2021 CLINICAL DATA:  Code stroke for left-sided weakness EXAM: CT ANGIOGRAPHY HEAD AND NECK TECHNIQUE: Multidetector CT imaging of the head and neck was performed using the standard protocol during bolus administration of intravenous contrast. Multiplanar CT image reconstructions and MIPs were obtained to evaluate the vascular anatomy. Carotid stenosis measurements (when applicable) are obtained utilizing NASCET criteria, using the distal internal carotid diameter as the denominator. RADIATION DOSE REDUCTION: This exam was performed according to the departmental dose-optimization program which includes automated exposure control, adjustment of the mA and/or kV according to patient size and/or use of iterative reconstruction technique. CONTRAST:  159m OMNIPAQUE IOHEXOL 350 MG/ML SOLN COMPARISON:  Noncontrast head CT from earlier today FINDINGS: CTA NECK FINDINGS Aortic arch: No acute finding Right carotid system: Mild atheromatous plaque at the bifurcation. No stenosis or ulceration. Left carotid system: Mild calcified plaque at the bifurcation. No stenosis or ulceration. Vertebral arteries: Proximal subclavian atherosclerosis without flow reducing stenosis. Codominant vertebral arteries there are smoothly contoured and widely patent to the dura. Skeleton: No acute or aggressive finding Other neck: No acute finding Upper chest: Fine interstitial opacity at the apices symmetrically. Review of the MIP images confirms the above findings CTA HEAD FINDINGS Anterior circulation:  Calcified plaque along the carotid siphons. No occlusion, beading, or proximal flow limiting stenosis. Negative for aneurysm. Posterior circulation: Calcified  plaque along the V4 segments. No branch occlusion, beading, or aneurysm. No evidence of vascular malformation. Equivocal for spot sign in the lower aspect of the hematoma. Venous sinuses: Diffusely patent Anatomic variants: None significant Review of the MIP images confirms the above findings IMPRESSION: 1. No arterial lesion underlying the right thalamic hematoma. Equivocal for spot sign which would be an adverse finding suggesting continued growth. 2. Atherosclerosis without flow limiting stenosis of major vessels. 3. Interstitial opacity at the apices, age-indeterminate. Recommend chest radiograph. Electronically Signed   By: Jorje Guild M.D.   On: 08/23/2021 07:12   DG Chest Portable 1 View  Result Date: 08/23/2021 CLINICAL DATA:  Endotracheal and nasogastric tube placement Stroke ESRD Diabetes EXAM: PORTABLE CHEST - 1 VIEW COMPARISON:  07/09/2019 FINDINGS: Unchanged cardiomegaly. Diffuse bilateral lung opacities. Endotracheal tube tip located 1.8 cm of the carina. Nasogastric tube terminates in the left upper quadrant. IMPRESSION: 1. Mild cardiomegaly. 2. Diffuse bilateral lung opacities may be due to pulmonary edema or multifocal pneumonia. Electronically Signed   By: Miachel Roux M.D.   On: 08/23/2021 07:51   ECHOCARDIOGRAM COMPLETE  Result Date: 08/23/2021    ECHOCARDIOGRAM REPORT   Patient Name:   TANEQUA KRETZ [facility] Date of Exam: 08/23/2021 Medical Rec #:  [id number]          Height:       67.0 in Accession #:    [id number]         Weight:       230.4 lb Date of Birth:  07/02/1965           BSA:          2.148 m Patient Age:    43 years           BP:           134/62 mmHg Patient Gender: F                  HR:           60 bpm. Exam Location:  Inpatient Procedure: 2D Echo, Color Doppler and Cardiac Doppler Indications:    Stroke i63.9   History:        Patient has prior history of Echocardiogram examinations, most                 recent 07/09/2019. CHF; Risk Factors:Hypertension and Diabetes.  Sonographer:    Raquel Sarna Senior RDCS Referring Phys: [id number] ASHISH ARORA  Sonographer Comments: Scanned supine on artificial respirator. IMPRESSIONS  1. Left ventricular ejection fraction, by estimation, is 60 to 65%. The left ventricle has normal function. The left ventricle has no regional wall motion abnormalities. There is severe left ventricular hypertrophy. Left ventricular diastolic parameters  are consistent with Grade II diastolic dysfunction (pseudonormalization). Elevated left atrial pressure.  2. Right ventricular systolic function is normal. The right ventricular size is normal.  3. Left atrial size was moderately dilated.  4. The mitral valve is normal in structure. Trivial mitral valve regurgitation. No evidence of mitral stenosis.  5. The aortic valve is tricuspid. Aortic valve regurgitation is not visualized. Aortic valve sclerosis is present, with no evidence of aortic valve stenosis.  6. The inferior vena cava is normal in size with greater than 50% respiratory variability, suggesting right atrial pressure of 3 mmHg. FINDINGS  Left Ventricle: Left ventricular ejection fraction, by estimation, is 60 to 65%. The left ventricle has normal function. The left ventricle has no regional wall motion abnormalities. The left ventricular  internal cavity size was normal in size. There is  severe left ventricular hypertrophy. Left ventricular diastolic parameters are consistent with Grade II diastolic dysfunction (pseudonormalization). Elevated left atrial pressure. Right Ventricle: The right ventricular size is normal. Right ventricular systolic function is normal. Left Atrium: Left atrial size was moderately dilated. Right Atrium: Right atrial size was normal in size. Pericardium: There is no evidence of pericardial effusion. Mitral Valve: The mitral  valve is normal in structure. Mild mitral annular calcification. Trivial mitral valve regurgitation. No evidence of mitral valve stenosis. Tricuspid Valve: The tricuspid valve is normal in structure. Tricuspid valve regurgitation is trivial. No evidence of tricuspid stenosis. Aortic Valve: The aortic valve is tricuspid. Aortic valve regurgitation is not visualized. Aortic valve sclerosis is present, with no evidence of aortic valve stenosis. Pulmonic Valve: The pulmonic valve was normal in structure. Pulmonic valve regurgitation is not visualized. No evidence of pulmonic stenosis. Aorta: The aortic root is normal in size and structure. Venous: The inferior vena cava is normal in size with greater than 50% respiratory variability, suggesting right atrial pressure of 3 mmHg. IAS/Shunts: No atrial level shunt detected by color flow Doppler.  LEFT VENTRICLE PLAX 2D LVIDd:         4.90 cm   Diastology LVIDs:         3.10 cm   LV e' medial:    3.81 cm/s LV PW:         1.40 cm   LV E/e' medial:  30.4 LV IVS:        1.50 cm   LV e' lateral:   5.11 cm/s LVOT diam:     2.00 cm   LV E/e' lateral: 22.7 LV SV:         89 LV SV Index:   41 LVOT Area:     3.14 cm  RIGHT VENTRICLE RV S prime:     7.83 cm/s TAPSE (M-mode): 2.2 cm LEFT ATRIUM             Index        RIGHT ATRIUM           Index LA diam:        3.80 cm 1.77 cm/m   RA Area:     15.80 cm LA Vol (A2C):   83.7 ml 38.98 ml/m  RA Volume:   40.40 ml  18.81 ml/m LA Vol (A4C):   65.0 ml 30.27 ml/m LA Biplane Vol: 78.2 ml 36.41 ml/m  AORTIC VALVE LVOT Vmax:   97.40 cm/s LVOT Vmean:  63.800 cm/s LVOT VTI:    0.283 m  AORTA Ao Root diam: 3.20 cm Ao Asc diam:  3.20 cm MV E velocity: 116.00 cm/s MV A velocity: 78.40 cm/s   SHUNTS MV E/A ratio:  1.48         Systemic VTI:  0.28 m                             Systemic Diam: 2.00 cm Kirk Ruths MD Electronically signed by Kirk Ruths MD Signature Date/Time: 08/23/2021/10:50:25 AM    Final    CT HEAD CODE STROKE WO  CONTRAST  Result Date: 08/23/2021 CLINICAL DATA:  Code stroke.  Left facial droop EXAM: CT HEAD WITHOUT CONTRAST TECHNIQUE: Contiguous axial images were obtained from the base of the skull through the vertex without intravenous contrast. RADIATION DOSE REDUCTION: This exam was performed according to the departmental dose-optimization program which  includes automated exposure control, adjustment of the mA and/or kV according to patient size and/or use of iterative reconstruction technique. COMPARISON:  Brain MRI 04/02/2019 FINDINGS: Brain: 33 x 18 x 17 mm acute hemorrhage centered in the right thalamus with mild intraventricular extension into the right lateral and fourth ventricle. This location is usually related to hypertensive bleeds. The hematoma tracks towards the midbrain on the right. No evidence of regional acute infarction or mass. No hydrocephalus. Vascular: No hyperdense vessel or unexpected calcification. Skull: Normal. Negative for fracture or focal lesion. Sinuses/Orbits: No acute finding.  Bilateral cataract resection Other: Critical Value/emergent results were called by telephone at the time of interpretation on 08/23/2021 at 7:03 am to provider [facility] , who is already aware IMPRESSION: 5 cc acute hematoma in the right thalamus with intraventricular extension. No hydrocephalus. Electronically Signed   By: Jorje Guild M.D.   On: 08/23/2021 07:04    Review of Systems  Unable to perform ROS: Acuity of condition  Blood pressure (!) 117/59, pulse 64, temperature (!) 93.4 F (34.1 C), temperature source Rectal, resp. rate 16, height '5\' 7"'$  (1.702 m), weight 104.5 kg, SpO2 98 %. Physical Exam: Patient intubated and sedated. She is unresponsive and unable to follow commands. Minimal movement to noxious stimuli in her BUE and BLE. No spontaneous eye opening. Pupils 2--32m NR.  GCS 5    Assessment/Plan: 56year old female who presented to the ED with nausea, vomiting, and left-sided  hemiparesis who was found to have a right stomach bleed with intraventricular extension.  While in the emergency department any pain, her neurological status declined and required emergent intubation for airway protection.  Follow-up CT scan today revealed progression of the hemorrhage with development of hydrocephalus.  On examination she was unresponsive without any spontaneous movement.  The patient will likely benefit from an extraventricular drain.  We will proceed with EVD placement.    JMarvis Moeller DNP, AGNP-C Neurosurgery Nurse Practitioner  [facility] [address], [place] [id number]: [phone]   F: [phone] 08/23/2021 2:23 PM

## 2021-08-23 NOTE — Progress Notes (Signed)
[place] at 2200  Will sign out to overnight on call to check on [facility]  -- Amie Portland, MD Neurologist [facility] Pager: [phone]

## 2021-08-23 NOTE — ED Notes (Signed)
CBG UPON ARRIVAL 118.

## 2021-08-23 NOTE — TOC CAGE-AID Note (Signed)
Transition of Care [facility]) - CAGE-AID Screening   Patient Details  Name: Janet Mitchell MRN: [id number] Date of Birth: 12-01-1965  Transition of Care [facility] CM/SW Contact:    Janet Mitchell, [place] Phone Number: 08/23/2021, 1:12 PM   Clinical Narrative: Pt is unable to participate in Cage Aid. Pt is on full vent.  CSW will assess at a better time.  Janet Mitchell, MSW, LCSW-A Pronouns:  She/Her/Hers [facility] Clinical Social Worker Direct Number:  [phone] [email or web address]  CAGE-AID Screening: Substance Abuse Screening unable to be completed due to: : Patient unable to participate

## 2021-08-23 NOTE — Consult Note (Signed)
Renal Service Consult Note [facility]  Janet Mitchell 08/23/2021 Sol Blazing, MD Requesting Physician: Dr. Rory Percy  Reason for Consult: ESRD pt w/ ICH HPI: The patient is a 56 y.o. year-old w/ hx of ESRD on HD, CHF, HTN, DM2 who was brought to [facility] ED this am w/ new onset L facial flattening and slurred speech, also N/V. She was communicating at [facility] ED but then declined and required intubation. CTH showed R thalamic bleed w/ IVH. She was started on IV Cleviprex and tx'd to [facility] ICU for further rx. We are asked to see for dialysis.   Per the husband, pt is on HD at [facility] on MWF schedule. Good compliance, and very low wt gains. Pt was on PD 2017 until about 2021 when switched to HD due to membrane failure. Pt on vent, sedated, no hx obtained from the patient.   ROS - n/a  Past Medical History  Past Medical History:  Diagnosis Date   Abdominal wall hematoma 12/26/2019   Anemia    Arthritis    CHF (congestive heart failure) ([place])    pt states she has been cleared of heart failure/disease   Chronic cholecystitis with calculus    Chronic kidney disease    Phreesia 12/12/2019   COVID-19 03/26/2019   COVID-19 virus infection 03/2019   ESRD (end stage renal disease) on dialysis [facility])    Dialysis M-W-F   Headache    "a few/wk" (03/09/2018) - no longer having these   History of blood transfusion 10/2017   "low blood count" (03/09/2018)   Hypertension    Seasonal allergies    Spinal headache    Type II diabetes mellitus ([place])    Past Surgical History  Past Surgical History:  Procedure Laterality Date   AMPUTATION TOE Left 2013   Great toe   AV FISTULA PLACEMENT Left 07/22/2019   Procedure: LEFT ARM BASILIC ARTERIOVENOUS (AV) FISTULA CREATION;  Surgeon: Rosetta Posner, MD;  Location: [place];  Service: Vascular;  Laterality: Left;   [place] Left 10/17/2019   Procedure: LEFT SECOND STAGE [place];  Surgeon: Rosetta Posner, MD;  Location: [place];  Service: Vascular;  Laterality: Left;   BIOPSY  10/24/2018   Procedure: BIOPSY;  Surgeon: Daneil Dolin, MD;  [facility];  Service: Endoscopy;;  right and left colon   CATARACT EXTRACTION W/ INTRAOCULAR LENS IMPLANT Right    [place]; [place] N/A 03/09/2018   Procedure: LAPAROSCOPIC CHOLECYSTECTOMY WITH INTRAOPERATIVE CHOLANGIOGRAM ERAS PATHWAY;  Surgeon: Donnie Mesa, MD;  Location: [place];  Service: General;  Laterality: N/A;   COLONOSCOPY N/A 10/24/2018   Procedure: COLONOSCOPY;  Surgeon: Daneil Dolin, MD;  [facility];  Service: Endoscopy;  Laterality: N/A;  1:00pm   EYE SURGERY Left 05/15/2018   Removal of blood in the globe (due to DM)   FLEXIBLE SIGMOIDOSCOPY N/A 11/24/2017   Procedure: FLEXIBLE SIGMOIDOSCOPY;  Surgeon: Daneil Dolin, MD;  [facility];  Service: Endoscopy;  Laterality: N/A;   IR FLUORO GUIDE CV LINE RIGHT  07/17/2019   IR PARACENTESIS  07/09/2019   IR US GUIDE VASC ACCESS RIGHT  07/17/2019   LAPAROSCOPIC CHOLECYSTECTOMY  03/09/2018   PERITONEAL CATHETER INSERTION  2017   POLYPECTOMY  10/24/2018   Procedure: POLYPECTOMY;  Surgeon: Daneil Dolin, MD;  [facility];  Service: Endoscopy;;   TOTAL HIP ARTHROPLASTY Left 1997  Family History  Family History  Problem Relation Age of Onset   Heart disease Mother    Thrombocytopenia Mother        TTP   Heart failure Father    Kidney disease Paternal Grandfather    Colon cancer Neg Hx    Social History  reports that she has never smoked. She has been exposed to tobacco smoke. She has never used smokeless tobacco. She reports that she does not drink alcohol and does not use drugs. Allergies No Known Allergies Home medications Prior to Admission medications   Medication Sig Start Date End Date Taking? Authorizing Provider  aspirin EC 81 MG tablet Take 1 tablet (81 mg total) by mouth daily. 08/06/19  Yes Thurnell Lose, MD  Accu-Chek Softclix Lancets lancets Please use it to check blood glucose twice daily and PRN. 06/03/21   Lindell Spar, MD  acetaminophen (TYLENOL) 325 MG tablet Take 650 mg by mouth every 6 (six) hours as needed (pain).    [provider]  B Complex-C-Folic Acid (RENA-VITE PO) Take 1 tablet by mouth daily.    [provider]  Blood Glucose Monitoring Suppl (ACCU-CHEK GUIDE) w/Device KIT 1 Piece by Does not apply route as directed. 02/28/19   Cassandria Anger, MD  cadexomer iodine (IODOSORB) 0.9 % gel Apply to left foot ulcer once daily. 10/23/20   Marzetta Board, DPM  calcitRIOL (ROCALTROL) 0.5 MCG capsule Take 1-2 capsules (0.5-1 mcg total) by mouth See admin instructions. 0.8mg on Mondays through Fridays and take 160m on Saturdays and Sundays 08/29/19   Pahwani, Rinka R, MD  calcium acetate (PHOSLO) 667 MG capsule Take by mouth. 11/20/19   [provider]  cetirizine (ZYRTEC) 10 MG tablet TAKE 1 TABLET BY MOUTH ONCE DAILY. 06/18/21   PaLindell SparMD  cholestyramine (QLucrezia Starch4 g packet Take 0.5 packets (2 g total) by mouth daily. 11/10/20   PaLindell SparMD  ciprofloxacin (CIPRO) 500 MG tablet Take 1 tablet (500 mg total) by mouth 2 (two) times daily for 10 days. 08/19/21 08/29/21  McDonald, AdStephan MinisterDPM  glucose blood (ACCU-CHEK GUIDE) test strip Use as instructed 06/03/21   PaLindell SparMD  hydrALAZINE (APRESOLINE) 10 MG tablet Take 10 mg by mouth 2 (two) times daily.    [provider]  labetalol (NORMODYNE) 200 MG tablet Take 1 tablet (200 mg total) by mouth 2 (two) times daily. 06/03/21   PaLindell SparMD  losartan (COZAAR) 100 MG tablet TAKE ONE TABLET BY MOUTH DAILY 01/15/21   PaLindell SparMD  psyllium (METAMUCIL) 58.6 % packet Take by mouth. 12/26/19   [provider]     Vitals:   08/23/21 0805 08/23/21 0810 08/23/21 0815 08/23/21 0910  BP: 113/60 (!) 126/49 128/65 134/65  Pulse: 68 68 68 76  Resp: _0 Temp:      TempSrc:      SpO2: 95% 95% 98% 96%  Weight:      Height:       Exam Gen on vent, sedated No rash, cyanosis or gangrene Sclera anicteric, throat w/ ETT No jvd or bruits Chest clear anterior/ lateral, no rales/ rhonchi RRR no RG Abd soft ntnd no mass or ascites +bs GU deferred MS no joint effusions or deformity Ext L 1st toe amputated, no LE or UE edema Neuro is on vent, sedated    Home meds include - asa, tylenol, renavite, cadexomer iodine, phoslo,  ceterizine, cholestyramine, hydralazine 10 bid, labetalol 200 bid, losartan 100, psyllium     OP HD: [facility] MWF  - pending    Assessment/ Plan: ICH - w L sided symptoms. CT shows subcortical bleed w/ IVH and hydrocephalus. Per neuro.  HTN'sive emergency - uncontrolled HTN, on Cleviprex gtt to keep SBP < 140. Is on multiple BP lowering meds at home as well.  Vol - no gross vol excess on exam. Per husband her wt gains are very low in general.  ESRD - on HD MWF.  Get records. HD today/ tonight in ICU.  Atrial fib - on BB, a/c's on hold NSTEMI - cardiology consulted Anemia esrd - Hb 11- 13, no esa needs MBD ckd - CCa in range, will add on phos. No need for binder for now.  Nutrition - cont renavite per NG. If tube feeds needed we should use low K+ preparation (I/e., Nepro).       Rob Mack Thurmon  MD 08/23/2021, 11:00 AM Recent Labs  Lab 08/23/21 5064 08/23/21 0640 08/23/21 0926  HGB 10.9* 12.9 12.6  ALBUMIN 3.8  --   --   CALCIUM 8.5*  --   --   CREATININE 9.25* 10.60*  --   K 5.2* 5.3* 5.7*

## 2021-08-23 NOTE — ED Notes (Signed)
Dr Rory Percy paged to Dr Sabra Heck @ [phone]

## 2021-08-23 NOTE — Plan of Care (Signed)
Patient admitted 08/23/21 with a medical diagnosis of ICH. Now s/p EVD placement. Remains intubated. Remains on infusions of Cleviprex, Precedex, and 3% NaCl. CTH completed this shift.    Problem: Education: Goal: Knowledge of disease or condition will improve Outcome: Progressing Goal: Knowledge of secondary prevention will improve (SELECT ALL) Outcome: Progressing Goal: Knowledge of patient specific risk factors will improve (INDIVIDUALIZE FOR PATIENT) Outcome: Progressing Goal: Individualized Educational Video(s) Outcome: Progressing   Problem: Coping: Goal: Will verbalize positive feelings about self Outcome: Progressing Goal: Will identify appropriate support needs Outcome: Progressing   Problem: Health Behavior/Discharge Planning: Goal: Ability to manage health-related needs will improve Outcome: Progressing   Problem: Self-Care: Goal: Ability to participate in self-care as condition permits will improve Outcome: Progressing Goal: Verbalization of feelings and concerns over difficulty with self-care will improve Outcome: Progressing Goal: Ability to communicate needs accurately will improve Outcome: Progressing   Problem: Nutrition: Goal: Risk of aspiration will decrease Outcome: Progressing Goal: Dietary intake will improve Outcome: Progressing   Problem: Intracerebral Hemorrhage Tissue Perfusion: Goal: Complications of Intracerebral Hemorrhage will be minimized Outcome: Progressing   Problem: Education: Goal: Knowledge of General Education information will improve Description: Including pain rating scale, medication(s)/side effects and non-pharmacologic comfort measures Outcome: Progressing   Problem: Health Behavior/Discharge Planning: Goal: Ability to manage health-related needs will improve Outcome: Progressing   Problem: Clinical Measurements: Goal: Ability to maintain clinical measurements within normal limits will improve Outcome: Progressing Goal:  Will remain free from infection Outcome: Progressing Goal: Diagnostic test results will improve Outcome: Progressing Goal: Respiratory complications will improve Outcome: Progressing Goal: Cardiovascular complication will be avoided Outcome: Progressing   Problem: Activity: Goal: Risk for activity intolerance will decrease Outcome: Progressing   Problem: Nutrition: Goal: Adequate nutrition will be maintained Outcome: Progressing   Problem: Coping: Goal: Level of anxiety will decrease Outcome: Progressing   Problem: Elimination: Goal: Will not experience complications related to bowel motility Outcome: Progressing Goal: Will not experience complications related to urinary retention Outcome: Progressing   Problem: Pain Managment: Goal: General experience of comfort will improve Outcome: Progressing   Problem: Safety: Goal: Ability to remain free from injury will improve Outcome: Progressing   Problem: Skin Integrity: Goal: Risk for impaired skin integrity will decrease Outcome: Progressing

## 2021-08-23 NOTE — ED Notes (Signed)
Report called to Thurmond Butts, Therapist, sports at [facility]

## 2021-08-23 NOTE — Progress Notes (Signed)
Pt transported to CT and back to 58M 15 on full vent support. No complications noted.

## 2021-08-23 NOTE — Progress Notes (Signed)
Pt transported to 4N 31 on full vent support. No complications noted.

## 2021-08-23 NOTE — Progress Notes (Signed)
Pharmacy Antibiotic Note  Janet Mitchell is a 56 y.o. female admitted on 08/23/2021 with  aspiration pneumonia and pseudomonas in left foot ulcer .  Pharmacy has been consulted for Zosyn dosing.  Patient was prescribed Cipro outpatient for Pseudomonas in left foot ulcer. WBC wnl. Concern for possible aspiration. Hypothermic. Plan for HD later today or tonight.    Plan: Zosyn 2.25g IV every 8 hours.  - while on iHD Monitor renal plans, culture results, and clinical status  Height: '5\' 7"'$  (170.2 cm) Weight: 104.5 kg (230 lb 6.1 oz) IBW/kg (Calculated) : 61.6  Temp (24hrs), Avg:95.3 F (35.2 C), Min:93.7 F (34.3 C), Max:96.8 F (36 C)  Recent Labs  Lab 08/23/21 0632 08/23/21 0640  WBC 4.0  --   CREATININE 9.25* 10.60*    Estimated Creatinine Clearance: 7.6 mL/min (A) (by C-G formula based on SCr of 10.6 mg/dL (H)).    No Known Allergies  Antimicrobials this admission: Zosyn 5/29 >>  Dose adjustments this admission:   Microbiology results: 5/29 Resp culture >>   Thank you for allowing pharmacy to be a part of this patient's care.  Sloan Leiter, PharmD, BCPS, BCCCP Clinical Pharmacist Please refer to [facility] for [place] numbers 08/23/2021 11:41 AM

## 2021-08-23 NOTE — Progress Notes (Deleted)
PT Cancellation Note  Patient Details Name: Janet Mitchell MRN: [id number] DOB: 1966-03-06   Cancelled Treatment:    Reason Eval/Treat Not Completed: Active bedrest order   Sandy Salaam Tacari Repass 08/23/2021, 9:35 AM Bayard Males, PT Acute Rehabilitation Services Pager: [phone] Office: [phone]

## 2021-08-23 NOTE — Progress Notes (Signed)
I discussed with the patient's husband the EVD procedure.  I explained that although it will treat her mild hydrocephalus, with her large thalamic bleed with extension into upper midbrain, its benefits are uncertain.  Nevertheless, it would be appropriate to proceed with the procedure to treat her mild hydrocephalus.   The general technique as well as risks, benefits, alternatives, and expected convalescence were discussed.   All questions and concerns were answered. He verbalized understanding and agreement and wished to proceed.

## 2021-08-23 NOTE — Progress Notes (Signed)
Check with the RN-neurosurgery at bedside now to do the EVD. I would like to get a CT head after the EVD is placed.     -- Amie Portland, MD Neurologist [facility] Pager: [phone]

## 2021-08-23 NOTE — Consult Note (Signed)
NAME:  Janet Mitchell, MRN:  [id number], DOB:  04-Mar-1966, LOS: 0 ADMISSION DATE:  08/23/2021, CONSULTATION DATE:  08/23/2021 REFERRING MD:  Dr. Rory Percy, CHIEF COMPLAINT:     History of Present Illness:  56 year old female presents 5/29 to [facility] with reported left hemibody weakness, slurred speech, nausea/vomiting. Last known normal 6 hours prior to arrival. On arrival to ED is somnolent with slight disconjugate gaze with near flaccid left upper arm and left leg weakness. BP 222/107. CT head with 5 cc acute hematoma in the right thalamus with intraventricular extension. Neurology consulted and patient transferred to [facility] for further evaluation.   On arrival to unit patient is intubated, pupils 2 mm bilaterally and non-reactive. Withdrawals in all extremities, does not follow commands, on Propofol and Cleveprex gtt.    Pertinent  Medical History  ESRD with HD MWF, CHF, HTN, DM, s/p left great toe amputation secondary to ulcer/infection   Significant Hospital Events: Including procedures, antibiotic start and stop dates in addition to other pertinent events   5/28 > Presents to ED  5/29 > Transferred to [facility]  Interim History / Subjective:  As above.   Objective   Blood pressure 134/65, pulse 76, temperature (!) 96.8 F (36 C), temperature source Rectal, resp. rate 16, height _0  (1.702 m), weight 104.5 kg, SpO2 96 %.    Vent Mode: PRVC FiO2 (%):  [60 %-70 %] 60 % Set Rate:  [16 bmp] 16 bmp Vt Set:  [490 mL] 490 mL PEEP:  [5 cmH20] 5 cmH20 Plateau Pressure:  [21 cmH20-23 cmH20] 21 cmH20   Intake/Output Summary (Last 24 hours) at 08/23/2021 1107 Last data filed at 08/23/2021 1000 Gross per 24 hour  Intake 74.81 ml  Output --  Net 74.81 ml   Filed Weights   08/23/21 0728  Weight: 104.5 kg    Examination: General: Critically ill appearing adult female on vent  HENT: ETT/OG in place  Lungs: Clear breath sounds, vent assisted breaths  Cardiovascular: RRR, HR 61, no  mRG Abdomen: Obese, active bowel sounds, soft  Extremities: left foot ulcer  Neuro: sedated. +Gag/cough, pupils non-reactive 3 mm bilaterally, withdrawals in all extremities   GU: intact   Resolved Hospital Problem list     Assessment & Plan:   Right Thalamus ICH with IVH and Obstructive Hydrocephalus  Plan -Per Neurology  -Consulting NSY for consideration of EVD -Hemoglobin AIC, Lipid Profile, TSH pending  -Sedation: Titrate Precedex for RASS 0/-1   HTN Emergency  Plan -Cardiac Monitoring  -Titrate Cleveprex for SBP goal 130-150   Respiratory Insuffiencey in setting of above.  -CXR with pulmonary edema vs consolidation  Plan -Continue Vent support -Trend ABG   -VAP prevention bundle   ESRD on HD MWF  Slight Hyperkalemia, K 5.2  Plan -Consult Nephrology  -Trend BMP   DM  Plan -Trend Glucose  -SSI   Foot Ulcer, +Pseudomonas on recent culture  Plan -Was recently prescribed Cipro, will change to Zosyn for now to also cover for possible aspiration event    Best Practice (right click and "Reselect all SmartList Selections" daily)   Diet/type: NPO DVT prophylaxis: SCD GI prophylaxis: PPI Lines: N/A Foley:  N/A Code Status:  full code Last date of multidisciplinary goals of care discussion [5/29]  Labs   CBC: Recent Labs  Lab 08/23/21 0632 08/23/21 0640 08/23/21 0926  WBC 4.0  --   --   NEUTROABS 2.2  --   --   HGB 10.9*  12.9 12.6  HCT 35.8* 38.0 37.0  MCV 81.0  --   --   PLT 90*  --   --     Basic Metabolic Panel: Recent Labs  Lab 08/23/21 0632 08/23/21 0640 08/23/21 0926  NA 139 140 137  K 5.2* 5.3* 5.7*  CL 99 99  --   CO2 29  --   --   GLUCOSE 131* 129*  --   BUN 76* 74*  --   CREATININE 9.25* 10.60*  --   CALCIUM 8.5*  --   --    GFR: Estimated Creatinine Clearance: 7.6 mL/min (A) (by C-G formula based on SCr of 10.6 mg/dL (H)). Recent Labs  Lab 08/23/21 0632  WBC 4.0    Liver Function Tests: Recent Labs  Lab 08/23/21 0632   AST 19  ALT 15  ALKPHOS 93  BILITOT 0.1*  PROT 7.5  ALBUMIN 3.8   No results for input(s): LIPASE, AMYLASE in the last 168 hours. No results for input(s): AMMONIA in the last 168 hours.  ABG    Component Value Date/Time   PHART 7.421 08/23/2021 0926   PCO2ART 48.1 (H) 08/23/2021 0926   PO2ART 95 08/23/2021 0926   HCO3 31.3 (H) 08/23/2021 0926   TCO2 33 (H) 08/23/2021 0926   O2SAT 97 08/23/2021 0926     Coagulation Profile: Recent Labs  Lab 08/23/21 0632  INR 1.2    Cardiac Enzymes: No results for input(s): CKTOTAL, CKMB, CKMBINDEX, TROPONINI in the last 168 hours.  HbA1C: Hemoglobin A1C  Date/Time Value Ref Range Status  04/28/2021 12:00 AM 6.8  Final  01/03/2019 12:00 AM 7.8  Final   HbA1c, POC (prediabetic range)  Date/Time Value Ref Range Status  06/09/2020 08:46 AM 6.4 5.7 - 6.4 % Final   Hgb A1c MFr Bld  Date/Time Value Ref Range Status  08/23/2021 10:20 AM 6.2 (H) 4.8 - 5.6 % Final    Comment:    (NOTE) Pre diabetes:          5.7%-6.4%  Diabetes:              >6.4%  Glycemic control for   <7.0% adults with diabetes   07/08/2019 08:19 PM 7.2 (H) 4.8 - 5.6 % Final    Comment:    (NOTE)         Prediabetes: 5.7 - 6.4         Diabetes: >6.4         Glycemic control for adults with diabetes: <7.0     CBG: Recent Labs  Lab 08/23/21 0728 08/23/21 0908  GLUCAP 150* 153*    Review of Systems:   Unable to review given intubated/sedated   Past Medical History:  She,  has a past medical history of Abdominal wall hematoma (12/26/2019), Anemia, Arthritis, CHF (congestive heart failure) ([place]), Chronic cholecystitis with calculus, COVID-19 (03/26/2019), COVID-19 virus infection (03/2019), ESRD (end stage renal disease) on dialysis [facility]), Headache, History of blood transfusion (10/2017), Hypertension, Seasonal allergies, Spinal headache, and Type II diabetes mellitus ([place]).   Surgical History:   Past Surgical History:  Procedure Laterality Date    AMPUTATION TOE Left 2013   Great toe   AV FISTULA PLACEMENT Left 07/22/2019   Procedure: LEFT ARM BASILIC ARTERIOVENOUS (AV) FISTULA CREATION;  Surgeon: Rosetta Posner, MD;  Location: [place];  Service: Vascular;  Laterality: Left;   [place] Left 10/17/2019   Procedure: LEFT SECOND STAGE [place];  Surgeon: Rosetta Posner, MD;  Location: [place] [facility];  Service: Vascular;  Laterality: Left;   BIOPSY  10/24/2018   Procedure: BIOPSY;  Surgeon: Daneil Dolin, MD;  [facility];  Service: Endoscopy;;  right and left colon   CATARACT EXTRACTION W/ INTRAOCULAR LENS IMPLANT Right    [place]; [place] N/A 03/09/2018   Procedure: LAPAROSCOPIC CHOLECYSTECTOMY WITH INTRAOPERATIVE CHOLANGIOGRAM ERAS PATHWAY;  Surgeon: Donnie Mesa, MD;  Location: [place];  Service: General;  Laterality: N/A;   COLONOSCOPY N/A 10/24/2018   Procedure: COLONOSCOPY;  Surgeon: Daneil Dolin, MD;  [facility];  Service: Endoscopy;  Laterality: N/A;  1:00pm   EYE SURGERY Left 05/15/2018   Removal of blood in the globe (due to DM)   FLEXIBLE SIGMOIDOSCOPY N/A 11/24/2017   Procedure: FLEXIBLE SIGMOIDOSCOPY;  Surgeon: Daneil Dolin, MD;  [facility];  Service: Endoscopy;  Laterality: N/A;   IR FLUORO GUIDE CV LINE RIGHT  07/17/2019   IR PARACENTESIS  07/09/2019   IR US GUIDE VASC ACCESS RIGHT  07/17/2019   LAPAROSCOPIC CHOLECYSTECTOMY  03/09/2018   PERITONEAL CATHETER INSERTION  2017   POLYPECTOMY  10/24/2018   Procedure: POLYPECTOMY;  Surgeon: Daneil Dolin, MD;  [facility];  Service: Endoscopy;;   TOTAL HIP ARTHROPLASTY Left 1997     Social History:   reports that she has never smoked. She has been exposed to tobacco smoke. She has never used smokeless tobacco. She reports that she does not drink alcohol and does not use drugs.   Family History:  Her family history includes Heart disease in her mother; Heart failure in  her father; Kidney disease in her paternal grandfather; Thrombocytopenia in her mother. There is no history of Colon cancer.   Allergies No Known Allergies   Home Medications  Prior to Admission medications   Medication Sig Start Date End Date Taking? Authorizing Provider  aspirin EC 81 MG tablet Take 1 tablet (81 mg total) by mouth daily. 08/06/19  Yes Thurnell Lose, MD  Accu-Chek Softclix Lancets lancets Please use it to check blood glucose twice daily and PRN. 06/03/21   Lindell Spar, MD  acetaminophen (TYLENOL) 325 MG tablet Take 650 mg by mouth every 6 (six) hours as needed (pain).    [provider]  B Complex-C-Folic Acid (RENA-VITE PO) Take 1 tablet by mouth daily.    [provider]  Blood Glucose Monitoring Suppl (ACCU-CHEK GUIDE) w/Device KIT 1 Piece by Does not apply route as directed. 02/28/19   Cassandria Anger, MD  cadexomer iodine (IODOSORB) 0.9 % gel Apply to left foot ulcer once daily. 10/23/20   Marzetta Board, DPM  calcitRIOL (ROCALTROL) 0.5 MCG capsule Take 1-2 capsules (0.5-1 mcg total) by mouth See admin instructions. 0.18mg on Mondays through Fridays and take 185m on Saturdays and Sundays 08/29/19   Pahwani, Rinka R, MD  calcium acetate (PHOSLO) 667 MG capsule Take by mouth. 11/20/19   [provider]  cetirizine (ZYRTEC) 10 MG tablet TAKE 1 TABLET BY MOUTH ONCE DAILY. 06/18/21   PaLindell SparMD  cholestyramine (QLucrezia Starch4 g packet Take 0.5 packets (2 g total) by mouth daily. 11/10/20   PaLindell SparMD  ciprofloxacin (CIPRO) 500 MG tablet Take 1 tablet (500 mg total) by mouth 2 (two) times daily for 10 days. 08/19/21 08/29/21  McCriselda PeachesDPM  glucose blood (ACCU-CHEK GUIDE) test strip Use as instructed 06/03/21   PaLindell Spar  MD  hydrALAZINE (APRESOLINE) 10 MG tablet Take 10 mg by mouth 2 (two) times daily.    [provider]  labetalol (NORMODYNE) 200 MG tablet Take 1 tablet (200 mg total) by mouth 2 (two) times  daily. 06/03/21   Lindell Spar, MD  losartan (COZAAR) 100 MG tablet TAKE ONE TABLET BY MOUTH DAILY 01/15/21   Lindell Spar, MD  psyllium (METAMUCIL) 58.6 % packet Take by mouth. 12/26/19   [provider]     Critical care time: 33 minutes     Hayden Pedro, AGACNP-BC [facility]  PCCM Pgr: [phone]

## 2021-08-23 NOTE — ED Provider Notes (Signed)
[facility] EMERGENCY DEPARTMENT Provider Note   CSN: [id number] Arrival date & time: 08/23/21  0539  An emergency department physician performed an initial assessment on this suspected stroke patient at 0629.  History  Chief Complaint  Patient presents with   Code Stroke    Janet Mitchell is a 56 y.o. female.  HPI  This patient is a 56 year old female with a known history of hypertension, diet-controlled diabetes, prior amputation of her left great toe secondary to an infection and ulcer when she lived in [place].  She supposedly is good about taking her medications and when she went to bed last night she was feeling okay.  Upon awakening this morning the patient was nauseated, she vomited, she had difficulty using her left side and had difficulty moving around in the bed, she was definitely abnormal according to her husband.  She was having difficulty communicating as well.  No recent chest pain shortness of breath or fevers.  The patient is unable to give much in the way of information at this time as she is only saying small amounts at a time and will oftentimes stop midsentence  Home Medications Prior to Admission medications   Medication Sig Start Date End Date Taking? Authorizing Provider  aspirin EC 81 MG tablet Take 1 tablet (81 mg total) by mouth daily. 08/06/19  Yes Thurnell Lose, MD  Accu-Chek Softclix Lancets lancets Please use it to check blood glucose twice daily and PRN. 06/03/21   Lindell Spar, MD  acetaminophen (TYLENOL) 325 MG tablet Take 650 mg by mouth every 6 (six) hours as needed (pain).    [provider]  B Complex-C-Folic Acid (RENA-VITE PO) Take 1 tablet by mouth daily.    [provider]  Blood Glucose Monitoring Suppl (ACCU-CHEK GUIDE) w/Device KIT 1 Piece by Does not apply route as directed. 02/28/19   Cassandria Anger, MD  cadexomer iodine (IODOSORB) 0.9 % gel Apply to left foot ulcer once daily. 10/23/20   Marzetta Board,  DPM  calcitRIOL (ROCALTROL) 0.5 MCG capsule Take 1-2 capsules (0.5-1 mcg total) by mouth See admin instructions. 0.67mg on Mondays through Fridays and take 154m on Saturdays and Sundays 08/29/19   Pahwani, Rinka R, MD  calcium acetate (PHOSLO) 667 MG capsule Take by mouth. 11/20/19   [provider]  cetirizine (ZYRTEC) 10 MG tablet TAKE 1 TABLET BY MOUTH ONCE DAILY. 06/18/21   PaLindell SparMD  cholestyramine (QLucrezia Starch4 g packet Take 0.5 packets (2 g total) by mouth daily. 11/10/20   PaLindell SparMD  ciprofloxacin (CIPRO) 500 MG tablet Take 1 tablet (500 mg total) by mouth 2 (two) times daily for 10 days. 08/19/21 08/29/21  McDonald, AdStephan MinisterDPM  glucose blood (ACCU-CHEK GUIDE) test strip Use as instructed 06/03/21   PaLindell SparMD  hydrALAZINE (APRESOLINE) 10 MG tablet Take 10 mg by mouth 2 (two) times daily.    [provider]  labetalol (NORMODYNE) 200 MG tablet Take 1 tablet (200 mg total) by mouth 2 (two) times daily. 06/03/21   PaLindell SparMD  losartan (COZAAR) 100 MG tablet TAKE ONE TABLET BY MOUTH DAILY 01/15/21   PaLindell SparMD  psyllium (METAMUCIL) 58.6 % packet Take by mouth. 12/26/19   [provider]      Allergies    Patient has no known allergies.    Review of Systems   Review of Systems  Unable to perform ROS: Acuity of condition  Physical Exam Updated Vital Signs BP 109/75   Pulse 71   Temp (!) 96.8 F (36 C) (Rectal)   Resp 16   Ht 1.702 m ('5\' 7"' )   Wt 104.5 kg   SpO2 100%   BMI 36.08 kg/m  Physical Exam Vitals and nursing note reviewed.  Constitutional:      General: She is in acute distress.     Appearance: She is well-developed. She is ill-appearing.     Comments: Somnolent  HENT:     Head: Normocephalic and atraumatic.     Nose: Nose normal. No congestion or rhinorrhea.     Mouth/Throat:     Mouth: Mucous membranes are moist.     Pharynx: No oropharyngeal exudate.  Eyes:     General: No scleral icterus.        Right eye: No discharge.        Left eye: No discharge.     Conjunctiva/sclera: Conjunctivae normal.     Comments: Slight disconjugate gaze, ptosis on the left  Neck:     Thyroid: No thyromegaly.     Vascular: No JVD.  Cardiovascular:     Rate and Rhythm: Normal rate and regular rhythm.     Heart sounds: Normal heart sounds. No murmur heard.   No friction rub. No gallop.  Pulmonary:     Effort: Pulmonary effort is normal. No respiratory distress.     Breath sounds: Normal breath sounds. No wheezing or rales.  Abdominal:     General: Bowel sounds are normal. There is no distension.     Palpations: Abdomen is soft. There is no mass.     Tenderness: There is no abdominal tenderness.  Musculoskeletal:        General: No tenderness. Normal range of motion.     Cervical back: Normal range of motion and neck supple.     Right lower leg: No edema.     Left lower leg: No edema.  Lymphadenopathy:     Cervical: No cervical adenopathy.  Skin:    General: Skin is warm and dry.     Findings: No erythema or rash.  Neurological:     Comments: Near flaccid left upper extremity, unable to grip, unable to perform finger-nose-finger, left leg has some more movement but has weakness.  Left-sided facial droop, left-sided ptosis, disconjugate gaze, difficulty with speech  Psychiatric:        Behavior: Behavior normal.    ED Results / Procedures / Treatments   Labs (all labs ordered are listed, but only abnormal results are displayed) Labs Reviewed  CBC - Abnormal; Notable for the following components:      Result Value   Hemoglobin 10.9 (*)    HCT 35.8 (*)    MCH 24.7 (*)    RDW 17.7 (*)    Platelets 90 (*)    All other components within normal limits  COMPREHENSIVE METABOLIC PANEL - Abnormal; Notable for the following components:   Potassium 5.2 (*)    Glucose, Bld 131 (*)    BUN 76 (*)    Creatinine, Ser 9.25 (*)    Calcium 8.5 (*)    Total Bilirubin 0.1 (*)    GFR, Estimated 5 (*)     All other components within normal limits  I-STAT CHEM 8, ED - Abnormal; Notable for the following components:   Potassium 5.3 (*)    BUN 74 (*)    Creatinine, Ser 10.60 (*)    Glucose, Bld 129 (*)  Calcium, Ion 1.03 (*)    TCO2 34 (*)    All other components within normal limits  CBG MONITORING, ED - Abnormal; Notable for the following components:   Glucose-Capillary 150 (*)    All other components within normal limits  RESP PANEL BY RT-PCR (FLU A&B, COVID) ARPGX2  ETHANOL  PROTIME-INR  APTT  DIFFERENTIAL  RAPID URINE DRUG SCREEN, HOSP PERFORMED  URINALYSIS, ROUTINE W REFLEX MICROSCOPIC  POC URINE PREG, ED    EKG None  Radiology CT ANGIO HEAD W OR WO CONTRAST  Result Date: 08/23/2021 CLINICAL DATA:  Code stroke for left-sided weakness EXAM: CT ANGIOGRAPHY HEAD AND NECK TECHNIQUE: Multidetector CT imaging of the head and neck was performed using the standard protocol during bolus administration of intravenous contrast. Multiplanar CT image reconstructions and MIPs were obtained to evaluate the vascular anatomy. Carotid stenosis measurements (when applicable) are obtained utilizing NASCET criteria, using the distal internal carotid diameter as the denominator. RADIATION DOSE REDUCTION: This exam was performed according to the departmental dose-optimization program which includes automated exposure control, adjustment of the mA and/or kV according to patient size and/or use of iterative reconstruction technique. CONTRAST:  160m OMNIPAQUE IOHEXOL 350 MG/ML SOLN COMPARISON:  Noncontrast head CT from earlier today FINDINGS: CTA NECK FINDINGS Aortic arch: No acute finding Right carotid system: Mild atheromatous plaque at the bifurcation. No stenosis or ulceration. Left carotid system: Mild calcified plaque at the bifurcation. No stenosis or ulceration. Vertebral arteries: Proximal subclavian atherosclerosis without flow reducing stenosis. Codominant vertebral arteries there are smoothly  contoured and widely patent to the dura. Skeleton: No acute or aggressive finding Other neck: No acute finding Upper chest: Fine interstitial opacity at the apices symmetrically. Review of the MIP images confirms the above findings CTA HEAD FINDINGS Anterior circulation: Calcified plaque along the carotid siphons. No occlusion, beading, or proximal flow limiting stenosis. Negative for aneurysm. Posterior circulation: Calcified plaque along the V4 segments. No branch occlusion, beading, or aneurysm. No evidence of vascular malformation. Equivocal for spot sign in the lower aspect of the hematoma. Venous sinuses: Diffusely patent Anatomic variants: None significant Review of the MIP images confirms the above findings IMPRESSION: 1. No arterial lesion underlying the right thalamic hematoma. Equivocal for spot sign which would be an adverse finding suggesting continued growth. 2. Atherosclerosis without flow limiting stenosis of major vessels. 3. Interstitial opacity at the apices, age-indeterminate. Recommend chest radiograph. Electronically Signed   By: JJorje GuildM.D.   On: 08/23/2021 07:12   CT ANGIO NECK W OR WO CONTRAST  Result Date: 08/23/2021 CLINICAL DATA:  Code stroke for left-sided weakness EXAM: CT ANGIOGRAPHY HEAD AND NECK TECHNIQUE: Multidetector CT imaging of the head and neck was performed using the standard protocol during bolus administration of intravenous contrast. Multiplanar CT image reconstructions and MIPs were obtained to evaluate the vascular anatomy. Carotid stenosis measurements (when applicable) are obtained utilizing NASCET criteria, using the distal internal carotid diameter as the denominator. RADIATION DOSE REDUCTION: This exam was performed according to the departmental dose-optimization program which includes automated exposure control, adjustment of the mA and/or kV according to patient size and/or use of iterative reconstruction technique. CONTRAST:  1028mOMNIPAQUE IOHEXOL  350 MG/ML SOLN COMPARISON:  Noncontrast head CT from earlier today FINDINGS: CTA NECK FINDINGS Aortic arch: No acute finding Right carotid system: Mild atheromatous plaque at the bifurcation. No stenosis or ulceration. Left carotid system: Mild calcified plaque at the bifurcation. No stenosis or ulceration. Vertebral arteries: Proximal subclavian atherosclerosis without flow reducing  stenosis. Codominant vertebral arteries there are smoothly contoured and widely patent to the dura. Skeleton: No acute or aggressive finding Other neck: No acute finding Upper chest: Fine interstitial opacity at the apices symmetrically. Review of the MIP images confirms the above findings CTA HEAD FINDINGS Anterior circulation: Calcified plaque along the carotid siphons. No occlusion, beading, or proximal flow limiting stenosis. Negative for aneurysm. Posterior circulation: Calcified plaque along the V4 segments. No branch occlusion, beading, or aneurysm. No evidence of vascular malformation. Equivocal for spot sign in the lower aspect of the hematoma. Venous sinuses: Diffusely patent Anatomic variants: None significant Review of the MIP images confirms the above findings IMPRESSION: 1. No arterial lesion underlying the right thalamic hematoma. Equivocal for spot sign which would be an adverse finding suggesting continued growth. 2. Atherosclerosis without flow limiting stenosis of major vessels. 3. Interstitial opacity at the apices, age-indeterminate. Recommend chest radiograph. Electronically Signed   By: Jorje Guild M.D.   On: 08/23/2021 07:12   CT HEAD CODE STROKE WO CONTRAST  Result Date: 08/23/2021 CLINICAL DATA:  Code stroke.  Left facial droop EXAM: CT HEAD WITHOUT CONTRAST TECHNIQUE: Contiguous axial images were obtained from the base of the skull through the vertex without intravenous contrast. RADIATION DOSE REDUCTION: This exam was performed according to the departmental dose-optimization program which includes  automated exposure control, adjustment of the mA and/or kV according to patient size and/or use of iterative reconstruction technique. COMPARISON:  Brain MRI 04/02/2019 FINDINGS: Brain: 33 x 18 x 17 mm acute hemorrhage centered in the right thalamus with mild intraventricular extension into the right lateral and fourth ventricle. This location is usually related to hypertensive bleeds. The hematoma tracks towards the midbrain on the right. No evidence of regional acute infarction or mass. No hydrocephalus. Vascular: No hyperdense vessel or unexpected calcification. Skull: Normal. Negative for fracture or focal lesion. Sinuses/Orbits: No acute finding.  Bilateral cataract resection Other: Critical Value/emergent results were called by telephone at the time of interpretation on 08/23/2021 at 7:03 am to provider [facility] , who is already aware IMPRESSION: 5 cc acute hematoma in the right thalamus with intraventricular extension. No hydrocephalus. Electronically Signed   By: Jorje Guild M.D.   On: 08/23/2021 07:04    Procedures .Critical Care Performed by: Noemi Chapel, MD Authorized by: Noemi Chapel, MD   Critical care provider statement:    Critical care time (minutes):  45   Critical care time was exclusive of:  Separately billable procedures and treating other patients and teaching time   Critical care was necessary to treat or prevent imminent or life-threatening deterioration of the following conditions:  CNS failure or compromise   Critical care was time spent personally by me on the following activities:  Development of treatment plan with patient or surrogate, discussions with consultants, evaluation of patient's response to treatment, examination of patient, ordering and review of laboratory studies, ordering and review of radiographic studies, ordering and performing treatments and interventions, pulse oximetry, re-evaluation of patient's condition and review of old charts   I  assumed direction of critical care for this patient from another provider in my specialty: no     Care discussed with: admitting provider   Procedure Name: Intubation Date/Time: 08/23/2021 7:31 AM Performed by: Noemi Chapel, MD Pre-anesthesia Checklist: Patient identified, Patient being monitored, Emergency Drugs available, Timeout performed and Suction available Oxygen Delivery Method: Ambu bag Preoxygenation: Pre-oxygenation with 100% oxygen Induction Type: Rapid sequence Ventilation: Mask ventilation without difficulty Laryngoscope Size: 4 Tube  size: 7.5 mm Number of attempts: 1 Airway Equipment and Method: Stylet Placement Confirmation: ETT inserted through vocal cords under direct vision, CO2 detector and Breath sounds checked- equal and bilateral Secured at: 24 cm Tube secured with: ETT holder Dental Injury: Teeth and Oropharynx as per pre-operative assessment  Difficulty Due To: Difficulty was unanticipated Comments:         Medications Ordered in ED Medications  nicardipine (CARDENE) 27m in 0.86% saline 2074mIV infusion (0.1 mg/ml) (5 mg/hr Intravenous Rate/Dose Change 08/23/21 0730)  rocuronium bromide 100 MG/10ML SOSY (has no administration in time range)  etomidate (AMIDATE) 2 MG/ML injection (has no administration in time range)  propofol (DIPRIVAN) 1000 MG/100ML infusion (has no administration in time range)  clevidipine (CLEVIPREX) infusion 0.5 mg/mL (has no administration in time range)  iohexol (OMNIPAQUE) 350 MG/ML injection 125 mL (100 mLs Intravenous Contrast Given 08/23/21 [id number]   ED Course/ Medical Decision Making/ A&P                           Medical Decision Making Risk Prescription drug management.   This patient presents to the ED for concern of headache and vomiting with neurologic abnormalities, likely a intracranial disaster, this involves an extensive number of treatment options, and is a complaint that carries with it a high risk of  complications and morbidity.  The differential diagnosis includes intracranial hemorrhage, ischemic stroke, mass, tumor, herniation   Co morbidities that complicate the patient evaluation  Obesity, hypertension, diabetes, end-stage renal disease on dialysis   Additional history obtained:  Additional history obtained from electronic medical record as well as the family member at the bedside, External records from outside source obtained and reviewed including multiple office visits primarily for dialysis and for ulcers on the foot   Lab Tests:  I Ordered, and personally interpreted labs.  The pertinent results include: Metabolic panel confirming severe elevation in creatinine, mild hyperkalemia   Imaging Studies ordered:  I ordered imaging studies including CT scan of the brain without contrast I independently visualized and interpreted imaging which showed intracranial hemorrhage, with shift and mass effect I agree with the radiologist interpretation   Cardiac Monitoring: / EKG:  The patient was maintained on a cardiac monitor.  I personally viewed and interpreted the cardiac monitored which showed an underlying rhythm of: Normal sinus rhythm   Consultations Obtained:  I requested consultation with the Neurology (ARory PercyMD),  and discussed lab and imaging findings as well as pertinent plan - they recommend: transfer to higher level of care   Problem List / ED Course / Critical interventions / Medication management  Severe hypertension, on Cardene Intracranial hemorrhage, will discuss with neurology Hypoxia oxygen of 85% on nonrebreather doing much better Nausea and vomiting likely secondary to intracranial mass lesion, Zofran ordered I ordered medication including Zofran and Cardene for hypertension and vomiting Cardene was switched to Cleviprex at the request of neurology Reevaluation of the patient after these medicines showed that the patient critically ill I have  reviewed the patients home medicines and have made adjustments as needed   Social Determinants of Health:  Critically ill from intracranial hemorrhage   Test / Admission - Considered:  We will admit to higher level of care         Final Clinical Impression(s) / ED Diagnoses Final diagnoses:  [facility]    Rx / DC Orders ED Discharge Orders  None         Noemi Chapel, MD 08/23/21 [phone]

## 2021-08-23 NOTE — Progress Notes (Signed)
Echocardiogram 2D Echocardiogram has been performed.  Oneal Deputy Jacqulyn Barresi RDCS 08/23/2021, 10:39 AM

## 2021-08-23 NOTE — Procedures (Signed)
Arterial Catheter Insertion Procedure Note  Janet Mitchell  [id number]  14-Jan-1966  Date:08/23/21  Time:12:15 PM    Provider Performing: Jacky Kindle    Procedure: Insertion of Arterial Line [phone]) with US guidance ([id number])   Indication(s) Blood pressure monitoring and/or need for frequent ABGs  Consent Risks of the procedure as well as the alternatives and risks of each were explained to the patient and/or caregiver.  Consent for the procedure was obtained and is signed in the bedside chart  Anesthesia None   Time Out Verified patient identification, verified procedure, site/side was marked, verified correct patient position, special equipment/implants available, medications/allergies/relevant history reviewed, required imaging and test results available.   Sterile Technique Maximal sterile technique including full sterile barrier drape, hand hygiene, sterile gown, sterile gloves, mask, hair covering, sterile ultrasound probe cover (if used).   Procedure Description Area of catheter insertion was cleaned with chlorhexidine and draped in sterile fashion. With real-time ultrasound guidance an arterial catheter was placed into the right  axillary  artery.  Appropriate arterial tracings confirmed on monitor.     Complications/Tolerance None; patient tolerated the procedure well.   EBL Minimal   Specimen(s) None

## 2021-08-23 NOTE — Progress Notes (Signed)
HD will be have to be pushed to tomorrow due to emergencies today.  Ordered lokelma per NG tube for mild ^K+.   Kelly Splinter, MD 08/23/2021, 1:23 PM

## 2021-08-23 NOTE — Plan of Care (Signed)
Called by Dr. Waverly Ferrari at [facility] ER. Patient with Hypertensive ICH Recommendations: -SBP goal <140. -Use cleviprex -Admit to NeuroICU under my service at [facility]. Will see the patient once at [facility].  -- Amie Portland, MD Neurologist [facility] Pager: [phone]

## 2021-08-23 NOTE — H&P (Signed)
Neurology H&P  CC: nausea, vomiting and left sided weakness  History is obtained from: chart - patient was intubated on arrival  HPI: Janet Mitchell is a 56 y.o. female PMH of ESRD on HD MWF, CHF, HTN, DM presented to [facility] ER as code stroke (no consult in chart) - with complaints of nausea and vomiting on waking up along with left sided weakness. Last normal midnight. She was communicative at [facility] but during her stay awaiting trasnfer to [facility] - needed emergent intubation due to inability to protect airway. CTH with right thalamus with IVH. CT angiography head and neck done at [facility] with no acute findings although there was a concern for an equivocal spot sign raising concern for continued growth of the hematoma. Started on Cleviprex and transferred to [facility] ICU for further management.  LKW: 0001 hrs tpa given?: no, ICH Premorbid modified Rankin scale (mRS): 0   ROS: Unable to obtain due to altered mental status.   Past Medical History:  Diagnosis Date   Abdominal wall hematoma 12/26/2019   Anemia    Arthritis    CHF (congestive heart failure) ([place])    pt states she has been cleared of heart failure/disease   Chronic cholecystitis with calculus    Chronic kidney disease    Phreesia 12/12/2019   COVID-19 03/26/2019   COVID-19 virus infection 03/2019   ESRD (end stage renal disease) on dialysis [facility])    Dialysis M-W-F   Headache    "a few/wk" (03/09/2018) - no longer having these   History of blood transfusion 10/2017   "low blood count" (03/09/2018)   Hypertension    Seasonal allergies    Spinal headache    Type II diabetes mellitus ([place])         Family History  Problem Relation Age of Onset   Heart disease Mother    Thrombocytopenia Mother        TTP   Heart failure Father    Kidney disease Paternal Grandfather    Colon cancer Neg Hx      Social History:   reports that she has never smoked. She has been exposed to tobacco  smoke. She has never used smokeless tobacco. She reports that she does not drink alcohol and does not use drugs.  Medications  Current Facility-Administered Medications:     stroke: early stages of recovery book, , Does not apply, Once, Amie Portland, MD   acetaminophen (TYLENOL) tablet 650 mg, 650 mg, Oral, Q4H PRN **OR** acetaminophen (TYLENOL) 160 MG/5ML solution 650 mg, 650 mg, Per Tube, Q4H PRN **OR** acetaminophen (TYLENOL) suppository 650 mg, 650 mg, Rectal, Q4H PRN, Amie Portland, MD   clevidipine (CLEVIPREX) infusion 0.5 mg/mL, 0-21 mg/hr, Intravenous, Continuous, Noemi Chapel, MD, Last Rate: 2 mL/hr at 08/23/21 0808, 1 mg/hr at 08/23/21 0808   pantoprazole (PROTONIX) injection 40 mg, 40 mg, Intravenous, QHS, Amie Portland, MD   propofol (DIPRIVAN) 1000 MG/100ML infusion, 5-80 mcg/kg/min, Intravenous, Continuous, Noemi Chapel, MD, Last Rate: 12.54 mL/hr at 08/23/21 0736, 20 mcg/kg/min at 08/23/21 0736   senna-docusate (Senokot-S) tablet 1 tablet, 1 tablet, Oral, BID, Amie Portland, MD   Exam: Current vital signs: BP 134/65   Pulse 76   Temp (!) 96.8 F (36 C) (Rectal)   Resp 16   Ht '5\' 7"'$  (1.702 m)   Wt 104.5 kg   SpO2 96%   BMI 36.08 kg/m  Vital signs in last 24 hours: Temp:  [96.8 F (36 C)] 96.8 F (  36 C) (05/29 0706) Pulse Rate:  [67-79] 76 (05/29 0910) Resp:  [11-26] 16 (05/29 0910) BP: (94-225)/(49-116) 134/65 (05/29 0910) SpO2:  [84 %-100 %] 96 % (05/29 0910) FiO2 (%):  [60 %-70 %] 60 % (05/29 0910) Weight:  [104.5 kg] 104.5 kg (05/29 0728) General: Sedated intubated HEENT: Normocephalic atraumatic Chest clear Cardiovascular: Regular rhythm Abdomen nondistended nontender Extremities warm well perfused Neurological exam Sedated intubated Pupils sluggish equal round reactive to light Breathing with the ventilator No spontaneous movements To noxious stimulation has some movement in the right upper right lower and left lower extremity.  Weaker on the left  arm. NIH stroke scale: 31  Labs I have reviewed labs in epic and the results pertinent to this consultation are:  CBC    Component Value Date/Time   WBC 4.0 08/23/2021 0632   RBC 4.42 08/23/2021 0632   HGB 12.6 08/23/2021 0926   HGB 11.3 05/06/2021 1352   HCT 37.0 08/23/2021 0926   HCT 36.2 05/06/2021 1352   PLT 90 (L) 08/23/2021 0632   PLT 101 (L) 05/06/2021 1352   MCV 81.0 08/23/2021 0632   MCV 76 (L) 05/06/2021 1352   MCH 24.7 (L) 08/23/2021 0632   MCHC 30.4 08/23/2021 0632   RDW 17.7 (H) 08/23/2021 0632   RDW 18.0 (H) 05/06/2021 1352   LYMPHSABS 1.1 08/23/2021 0632   LYMPHSABS 1.5 05/06/2021 1352   MONOABS 0.4 08/23/2021 0632   EOSABS 0.3 08/23/2021 0632   EOSABS 0.3 05/06/2021 1352   BASOSABS 0.0 08/23/2021 0632   BASOSABS 0.1 05/06/2021 1352    CMP     Component Value Date/Time   NA 137 08/23/2021 0926   K 5.7 (H) 08/23/2021 0926   CL 99 08/23/2021 0640   CO2 29 08/23/2021 0632   GLUCOSE 129 (H) 08/23/2021 0640   BUN 74 (H) 08/23/2021 0640   CREATININE 10.60 (H) 08/23/2021 0640   CREATININE 5.02 (H) 08/29/2019 1506   CALCIUM 8.5 (L) 08/23/2021 0632   PROT 7.5 08/23/2021 0632   ALBUMIN 3.8 08/23/2021 0632   AST 19 08/23/2021 0632   ALT 15 08/23/2021 0632   ALKPHOS 93 08/23/2021 0632   BILITOT 0.1 (L) 08/23/2021 0632   GFRNONAA 5 (L) 08/23/2021 0632   GFRNONAA 9 (L) 08/29/2019 1506   GFRAA 11 (L) 08/29/2019 1506    Lipid Panel     Component Value Date/Time   CHOL 114 08/29/2019 1506   TRIG 91 08/29/2019 1506   HDL 35 (L) 08/29/2019 1506   CHOLHDL 3.3 08/29/2019 1506   LDLCALC 62 08/29/2019 1506     Imaging I have reviewed the images obtained:  CT-head: 5 cc acute hematoma in the right thalamus with intraventricular bleed. CT angiography head and neck with unequivocal spot sign.  No other abnormality. Repeat CT head upon arrival to [facility] with increased size of the above bleed with possible early hydrocephalus.  Assessment:   56 year old ESRD Monday Wednesday Friday hemodialysis, diabetes, hypertension presented to the emergency room at [facility] for nausea vomiting headache and left-sided weakness with last known well around midnight.  CT head with a small right thalamic bleed with intraventricular extension.  During her stay in the ER awaiting transfer to [facility], her mentation got worse and she was unable to protect her airway and required emergent intubation. Blood pressures reported in the 200s at the outside ER She was seen and evaluated upon arrival to [facility] ICU.  Due to the acute worsening  and requiring intubation, repeat head CT was performed which showed increased size of the bleed with developing hydrocephalus.  Plan: Subcortical ICH, nontraumatic IVH, nontraumatic Hydrocephalus  Acuity: Acute Laterality: right thalamus Current suspected etiology:    HTN Treatment: -Admit to  ICU -ICH Score: 3 -ICH Volume: <30cc  -BP control goal SYS 130-150 -NSGY consult for hematoma evac v EVD --consulted [place]ny [place] NP/J. Thomas MD -neuromonitoring  CNS Cerebral edema Compression of brain -NSGY consult  -Close neuro monitoring  Obstructive hydrocephalus - consult for EVD -neurosurgery aware -monitor  Toxic encephalopathy Anoxic encephalopathy -Correct metabolic causes -Monitor   Hemiplegia and hemiparesis following nontraumatic intracerebral hemorrhage affecting left non-dominant side  -Continue PT/OT/ST  RESP Acute Respiratory Failure-likely secondary to [place] and VH -vent management per ICU -wean when able  CV Hypertensive Encephalopathy Essential (primary) hypertension Hypertensive Emergency -Aggressive BP control, goal SBP 1 30-1 50 -Use Cleviprex  Heart failure, unspecified  -TTE -Continue BB -Cards Consult  GI/GU ESRD -Continue dialysis-nephrology (Dr. Jonnie Finner) consulted  HEME Iron Deficiency Anemia -Monitor -transfuse for hgb <  7  Thrombocytopenia -Check CBC again.  Keep platelet count above 50,000.  ENDO Type 2 diabetes mellitus with hyperglycemia  -SSI -goal HgbA1c < 7  Fluid/Electrolyte Disorders Hyperkalemia Nephrology on board Dialysis per nephrology  ID Possible Aspiration PNA -CXR -NPO -Monitor  Nutrition E66.9 Obesity  -diet consult  Prophylaxis DVT: SCDs GI: PPI Bowel: Docusate senna  Dispo-TBD  Diet: NPO  Code Status: Full Code   Consults called: ZOXW 9604 hrs, Nephrology 1029 hrs  THE FOLLOWING WERE PRESENT ON ADMISSION: Intracerebral hemorrhage, intraventricular hemorrhage, coma, obstructive hydrocephalus, hemiplegia, ventilator dependent respiratory failure, hypertensive emergency, thrombocytopenia, ESRD on HD -- Amie Portland, MD Neurologist [facility] Pager: [phone]   CRITICAL CARE ATTESTATION Performed by: Amie Portland, MD Total critical care time: 45 minutes Critical care time was exclusive of separately billable procedures and treating other patients and/or supervising [facility]Ps/Residents/Students Critical care was necessary to treat or prevent imminent or life-threatening deterioration due to [place], IVH This patient is critically ill and at significant risk for neurological worsening and/or death and care requires constant monitoring. Critical care was time spent personally by me on the following activities: development of treatment plan with patient and/or surrogate as well as nursing, discussions with consultants, evaluation of patient's response to treatment, examination of patient, obtaining history from patient or surrogate, ordering and performing treatments and interventions, ordering and review of laboratory studies, ordering and review of radiographic studies, pulse oximetry, re-evaluation of patient's condition, participation in multidisciplinary rounds and medical decision making of high complexity in the care of this patient.

## 2021-08-23 NOTE — ED Notes (Signed)
Report given to EMS and Pt transferred to EMS equipment.   This Probation officer will ride w/ Pt to titrate medications if needed.

## 2021-08-23 NOTE — Progress Notes (Signed)
SLP Cancellation Note  Patient Details Name: Janet Mitchell MRN: [id number] DOB: Dec 25, 1965   Cancelled treatment:       Reason Eval/Treat Not Completed: Patient not medically ready. Pt intubated   Josuha Fontanez, Katherene Ponto 08/23/2021, 10:39 AM

## 2021-08-23 NOTE — Progress Notes (Signed)
Pt transported to and from CT without event.  Rt will cont to monitor.

## 2021-08-23 NOTE — Progress Notes (Signed)
Arrived at patient room to do routine EEG.  RN stated pt having a drain placed in head soon.  Per Rory Percy EEG to be held and completed Tuesday 08/24/21

## 2021-08-23 NOTE — Procedures (Signed)
PREOP DX: Intraventricular hemorrhage with hydrocephalus  POSTOP DX: Intraventricular hemorrhage with hydrocephalus  PROCEDURE: Right frontal ventriculostomy   SURGEON: Dr. Duffy Rhody, MD  ANESTHESIA: IV Sedation (versed and fentanyl) with Local  EBL: Minimal  SPECIMENS: None  COMPLICATIONS: None  CONDITION: Hemodynamically stable  INDICATIONS: Mrs. Janet Mitchell is a 56 y.o. female who suffered a large right thalamic hemorrhage with intraventricular extension.  She had left hemiplegia and obtundation, and required intubation after aspiration.  A CT head showed she had developed mild hydrocephalus.  There also appeared to be extension into her midbrain.  PROCEDURE IN DETAIL: After consent was obtained from the patient's family, skin of the right frontal scalp was clipped, prepped and draped in the usual sterile fashion.  A timeout was performed.  Scalp was then infiltrated with local anesthetic with epinephrine.  Skin incision was made sharply, and twist drill burr hole was made.  The dura was then incised, and the ventricular catheter was passed in first attempt into the lateral ventricle.  Good CSF flow was obtained.  The catheter was then tunneled subcutaneously and connected to a drainage system and the skin incision closed.  The drain was then secured in place.  FINDINGS: 1. Opening pressure high 2. Mostly clear CSF

## 2021-08-24 ENCOUNTER — Inpatient Hospital Stay (HOSPITAL_COMMUNITY): Payer: 59

## 2021-08-24 ENCOUNTER — Inpatient Hospital Stay: Payer: Self-pay

## 2021-08-24 DIAGNOSIS — N186 End stage renal disease: Secondary | ICD-10-CM | POA: Diagnosis not present

## 2021-08-24 DIAGNOSIS — J9690 Respiratory failure, unspecified, unspecified whether with hypoxia or hypercapnia: Secondary | ICD-10-CM

## 2021-08-24 DIAGNOSIS — J9601 Acute respiratory failure with hypoxia: Secondary | ICD-10-CM

## 2021-08-24 DIAGNOSIS — R4182 Altered mental status, unspecified: Secondary | ICD-10-CM

## 2021-08-24 DIAGNOSIS — J9602 Acute respiratory failure with hypercapnia: Secondary | ICD-10-CM

## 2021-08-24 DIAGNOSIS — I615 Nontraumatic intracerebral hemorrhage, intraventricular: Secondary | ICD-10-CM

## 2021-08-24 DIAGNOSIS — I610 Nontraumatic intracerebral hemorrhage in hemisphere, subcortical: Secondary | ICD-10-CM | POA: Diagnosis not present

## 2021-08-24 DIAGNOSIS — G911 Obstructive hydrocephalus: Secondary | ICD-10-CM

## 2021-08-24 DIAGNOSIS — J9600 Acute respiratory failure, unspecified whether with hypoxia or hypercapnia: Secondary | ICD-10-CM

## 2021-08-24 DIAGNOSIS — I639 Cerebral infarction, unspecified: Secondary | ICD-10-CM | POA: Diagnosis not present

## 2021-08-24 DIAGNOSIS — E11621 Type 2 diabetes mellitus with foot ulcer: Secondary | ICD-10-CM

## 2021-08-24 DIAGNOSIS — Z992 Dependence on renal dialysis: Secondary | ICD-10-CM

## 2021-08-24 LAB — PHOSPHORUS: Phosphorus: 5.4 mg/dL — ABNORMAL HIGH (ref 2.5–4.6)

## 2021-08-24 LAB — GLUCOSE, CAPILLARY
Glucose-Capillary: 123 mg/dL — ABNORMAL HIGH (ref 70–99)
Glucose-Capillary: 143 mg/dL — ABNORMAL HIGH (ref 70–99)
Glucose-Capillary: 143 mg/dL — ABNORMAL HIGH (ref 70–99)
Glucose-Capillary: 134 mg/dL — ABNORMAL HIGH (ref 70–99)
Glucose-Capillary: 158 mg/dL — ABNORMAL HIGH (ref 70–99)
Glucose-Capillary: 146 mg/dL — ABNORMAL HIGH (ref 70–99)

## 2021-08-24 LAB — BASIC METABOLIC PANEL
Anion gap: 13 (ref 5–15)
BUN: 86 mg/dL — ABNORMAL HIGH (ref 6–20)
CO2: 25 mmol/L (ref 22–32)
Calcium: 8.2 mg/dL — ABNORMAL LOW (ref 8.9–10.3)
Chloride: 109 mmol/L (ref 98–111)
Creatinine, Ser: 10.76 mg/dL — ABNORMAL HIGH (ref 0.44–1.00)
GFR, Estimated: 4 mL/min — ABNORMAL LOW (ref >60)
Glucose, Bld: 134 mg/dL — ABNORMAL HIGH (ref 70–99)
Potassium: 6.1 mmol/L — ABNORMAL HIGH (ref 3.5–5.1)
Sodium: 147 mmol/L — ABNORMAL HIGH (ref 135–145)

## 2021-08-24 LAB — CBC
HCT: 32.6 % — ABNORMAL LOW (ref 36.0–46.0)
Hemoglobin: 10.6 g/dL — ABNORMAL LOW (ref 12.0–15.0)
MCH: 25.2 pg — ABNORMAL LOW (ref 26.0–34.0)
MCHC: 32.5 g/dL (ref 30.0–36.0)
MCV: 77.4 fL — ABNORMAL LOW (ref 80.0–100.0)
Platelets: 92 10*3/uL — ABNORMAL LOW (ref 150–400)
RBC: 4.21 MIL/uL (ref 3.87–5.11)
RDW: 17.2 % — ABNORMAL HIGH (ref 11.5–15.5)
WBC: 5.9 10*3/uL (ref 4.0–10.5)
nRBC: 0 % (ref 0.0–0.2)

## 2021-08-24 LAB — HEPATITIS B CORE ANTIBODY, TOTAL: Hep B Core Total Ab: NONREACTIVE

## 2021-08-24 LAB — SODIUM
Sodium: 141 mmol/L (ref 135–145)
Sodium: 144 mmol/L (ref 135–145)

## 2021-08-24 LAB — TSH: TSH: 1.265 u[IU]/mL (ref 0.350–4.500)

## 2021-08-24 LAB — HEPATITIS B SURFACE ANTIBODY,QUALITATIVE: Hep B S Ab: REACTIVE — AB

## 2021-08-24 LAB — CBG MONITORING, ED: Glucose-Capillary: 118 mg/dL — ABNORMAL HIGH (ref 70–99)

## 2021-08-24 LAB — HEPATITIS C ANTIBODY: HCV Ab: NONREACTIVE

## 2021-08-24 LAB — TRIGLYCERIDES: Triglycerides: 202 mg/dL — ABNORMAL HIGH (ref <150)

## 2021-08-24 LAB — HEPATITIS B SURFACE ANTIGEN: Hepatitis B Surface Ag: NONREACTIVE

## 2021-08-24 LAB — MAGNESIUM: Magnesium: 2.2 mg/dL (ref 1.7–2.4)

## 2021-08-24 MED ORDER — IPRATROPIUM-ALBUTEROL 0.5-2.5 (3) MG/3ML IN SOLN: 3.0000 mL | Freq: Four times a day (QID) | RESPIRATORY_TRACT | Status: DC | PRN

## 2021-08-24 MED ORDER — LABETALOL HCL 100 MG PO TABS
200.0000 mg | ORAL_TABLET | Freq: Two times a day (BID) | ORAL | Status: DC
Start: 2021-08-24 — End: 2021-08-24

## 2021-08-24 MED ORDER — LABETALOL HCL 100 MG PO TABS
200.0000 mg | ORAL_TABLET | Freq: Three times a day (TID) | ORAL | Status: DC
  Administered 2021-08-24 – 2021-08-26 (×5): 200 mg
  Filled 2021-08-24 (×5): qty 2

## 2021-08-24 MED ORDER — CHLORHEXIDINE GLUCONATE CLOTH 2 % EX PADS
6.0000 | MEDICATED_PAD | Freq: Every day | CUTANEOUS | Status: DC
  Administered 2021-08-25 – 2021-08-28 (×3): 6 via TOPICAL

## 2021-08-24 MED ORDER — LOSARTAN POTASSIUM 50 MG PO TABS
100.0000 mg | ORAL_TABLET | Freq: Every day | ORAL | Status: DC
  Administered 2021-08-25 – 2021-08-29 (×5): 100 mg
  Administered 2021-08-31: 50 mg
  Administered 2021-09-03: 100 mg
  Filled 2021-08-24 (×7): qty 2

## 2021-08-24 MED ORDER — HYDRALAZINE HCL 50 MG PO TABS
50.0000 mg | ORAL_TABLET | Freq: Three times a day (TID) | ORAL | Status: DC
  Administered 2021-08-24 – 2021-08-26 (×5): 50 mg
  Filled 2021-08-24 (×5): qty 1

## 2021-08-24 MED ORDER — HYDRALAZINE HCL 10 MG PO TABS
10.0000 mg | ORAL_TABLET | Freq: Two times a day (BID) | ORAL | Status: DC
  Filled 2021-08-24: qty 1

## 2021-08-24 MED ORDER — LOSARTAN POTASSIUM 50 MG PO TABS: 100.0000 mg | ORAL_TABLET | Freq: Every day | ORAL | Status: DC

## 2021-08-24 MED ORDER — HYDRALAZINE HCL 10 MG PO TABS: 10.0000 mg | ORAL_TABLET | Freq: Two times a day (BID) | ORAL | Status: DC

## 2021-08-24 MED ORDER — PANTOPRAZOLE 2 MG/ML SUSPENSION
40.0000 mg | Freq: Every day | ORAL | Status: DC
Start: 2021-08-24 — End: 2021-08-30
  Administered 2021-08-24 – 2021-08-29 (×6): 40 mg
  Filled 2021-08-24 (×6): qty 20

## 2021-08-24 MED ORDER — RENA-VITE PO TABS
1.0000 | ORAL_TABLET | Freq: Every day | ORAL | Status: DC
  Administered 2021-08-24 – 2021-10-04 (×39): 1
  Filled 2021-08-24 (×42): qty 1

## 2021-08-24 MED ORDER — CALCITRIOL 0.25 MCG PO CAPS
1.0000 ug | ORAL_CAPSULE | ORAL | Status: DC
  Administered 2021-08-25 – 2021-10-04 (×11): 1 ug
  Filled 2021-08-24: qty 2
  Filled 2021-08-24 (×2): qty 4
  Filled 2021-08-24 (×3): qty 2
  Filled 2021-08-24 (×2): qty 4
  Filled 2021-08-24 (×2): qty 2
  Filled 2021-08-24 (×3): qty 4
  Filled 2021-08-24 (×3): qty 2

## 2021-08-24 MED ORDER — LABETALOL HCL 100 MG PO TABS: 200.0000 mg | ORAL_TABLET | Freq: Two times a day (BID) | ORAL | Status: DC

## 2021-08-24 NOTE — Assessment & Plan Note (Deleted)
-  Continue Zosyn for pseudomonal infection.

## 2021-08-24 NOTE — Progress Notes (Signed)
Pt to received PICC in order for new 23.4NS to be started per Dr. Erlinda Hong. After communicating w/ the IV team and Dr. Augustin Coupe, Nephrology, pt has to have a central line/midline d/t being a HD pt. Dr. Erlinda Hong informed of this info and PICC order canceled.

## 2021-08-24 NOTE — Progress Notes (Signed)
EEG complete - results pending 

## 2021-08-24 NOTE — Assessment & Plan Note (Addendum)
-  Continue hemodialysis as per nephrology.

## 2021-08-24 NOTE — Progress Notes (Signed)
[facility] Progress Note  Subjective: pt responsive. Pt was cramping earlier during HD, so UF was turned off.   Vitals:   08/24/21 0845 08/24/21 0857 08/24/21 0900 08/24/21 0915  BP: 136/61 (!) 108/44 (!) 108/44 (!) 110/52  Pulse:  73 73 73  Resp:  _0 Temp:  97.7 F (36.5 C) (!) 97.5 F (36.4 C) (!) 97.5 F (36.4 C)  TempSrc: Esophageal     SpO2:  98% 98% 98%  Weight:      Height:        Exam: Gen on vent, sedated Sclera anicteric, throat w/ ETT No jvd or bruits Chest clear anterior/ lateral, no rales/ rhonchi RRR no RG Abd soft ntnd no mass or ascites +bs Ext no LE edema Neuro is on vent, sedated      Home meds include - asa, tylenol, renavite, cadexomer iodine, phoslo , ceterizine, cholestyramine, hydralazine 10 bid, labetalol 200 bid, losartan 100, psyllium        OP HD: [facility] MWF  4h  97.5kg  450/500  2/2.5 bath  15ga  AVF  Hep none - mircera 100 ug q2 - rocaltrol 1.0 mcg po tiw     Assessment/ Plan: ICH - w L sided symptoms. CTH showed IC bleed w/ IVH and hydrocephalus. Drain placed per neurosurgery 5/29. Getting 3% saline and q 6h sodium levels.  HTN'sive emergency - uncontrolled HTN, on Cleviprex gtt for BP control.  Vol - no gross vol excess on exam. Cramped during HD today. Wt's inaccurate.  ESRD - on HD MWF. HD today off schedule. K+ 6.1 this am. Low K+ bath.  Atrial fib - on BB, a/c's on hold Anemia esrd - Hb 11- 13, no esa needs MBD ckd - CCa and phos in range. No need for binder for now, cont po vdra per tube.  Nutrition - cont renavite per NG. If tube feeds needed we should use low K+ preparation (I/e., Nepro).        Rob Amariyah Bazar 08/24/2021, 9:37 AM   Recent Labs  Lab 08/23/21 [phone] 08/23/21 0640 08/23/21 0926 08/23/21 1020 08/24/21 0400  HGB 10.9* 12.9 12.6  --  10.6*  ALBUMIN 3.8  --   --   --   --   CALCIUM 8.5*  --   --   --  8.2*  PHOS  --   --   --  5.3* 5.4*  CREATININE 9.25* 10.60*  --   --   10.76*  K 5.2* 5.3* 5.7*  --  6.1*   Inpatient medications:  chlorhexidine gluconate (MEDLINE KIT)  15 mL Mouth Rinse BID   Chlorhexidine Gluconate Cloth  6 each Topical Q0600   Chlorhexidine Gluconate Cloth  6 each Topical Q0600   insulin aspart  0-6 Units Subcutaneous Q4H   mouth rinse  15 mL Mouth Rinse 10 times per day   pantoprazole (PROTONIX) IV  40 mg Intravenous QHS   senna-docusate  1 tablet Per Tube BID   sodium zirconium cyclosilicate  10 g Per Tube TID    clevidipine 7 mg/hr (08/24/21 0613)   dexmedetomidine (PRECEDEX) IV infusion 0.4 mcg/kg/hr (08/24/21 0354)   piperacillin-tazobactam (ZOSYN)  IV 2.25 g (08/24/21 0458)   sodium chloride (hypertonic) 75 mL/hr at 08/24/21 0353   acetaminophen **OR** acetaminophen (TYLENOL) oral liquid 160 mg/5 mL **OR** acetaminophen, fentaNYL (SUBLIMAZE) injection, fentaNYL (SUBLIMAZE) injection, ondansetron (ZOFRAN) IV

## 2021-08-24 NOTE — Assessment & Plan Note (Deleted)
Due to inability to protect airway  -Open ended trach collar trial  - First trach change due 6/8

## 2021-08-24 NOTE — Assessment & Plan Note (Deleted)
BP well controlled on enteral medications.

## 2021-08-24 NOTE — Progress Notes (Signed)
PT Cancellation Note  Patient Details Name: Katoria Yetman MRN: [id number] DOB: 1965/05/02   Cancelled Treatment:    Reason Eval/Treat Not Completed: Patient not medically ready (intubated and sedated).   Leighton Roach, PT  Acute Rehab Services  Pager [phone] Office [place] 08/24/2021, 8:37 AM

## 2021-08-24 NOTE — Progress Notes (Signed)
Subjective: Patient reports on vent and receiving HD. She is lying comfortably in bed in NAD. No acute events overnight.   Objective: Vital signs in last 24 hours: Temp:  [93.4 F (34.1 C)-98.6 F (37 C)] 97.9 F (36.6 C) (05/30 0800) Pulse Rate:  [59-76] 76 (05/30 0800) Resp:  [0-21] 18 (05/30 0800) BP: (93-145)/(51-102) 120/65 (05/30 0800) SpO2:  [96 %-100 %] 98 % (05/30 0800) Arterial Line BP: (127-166)/(44-78) 142/65 (05/30 0800) FiO2 (%):  [30 %-60 %] 30 % (05/30 0400) Weight:  [104.4 kg] 104.4 kg (05/30 0600)  Intake/Output from previous day: 05/29 0701 - 05/30 0700 In: 2123.2 [I.V.:1569.8; NG/GT:100; IV Piggyback:453.3] Out: 207 [Emesis/NG output:80; Drains:127] Intake/Output this shift: Total I/O In: 60.5 [I.V.:60.5] Out: 13 [Drains:13]  Physical Exam: Patient intubated and sedated. Currently receiving HD. She is not alert but will open eyes to noxious stimuli and follows simple commands on the right. No purposeful movement on the left. Pupils 53m NR.  GCS 9. EVD site is intact with good pulsatile flow. EVD average hourly output approximately 10 cc/hour.       Lab Results: Recent Labs    08/23/21 0632 08/23/21 0640 08/23/21 0926 08/24/21 0400  WBC 4.0  --   --  5.9  HGB 10.9*   < > 12.6 10.6*  HCT 35.8*   < > 37.0 32.6*  PLT 90*  --   --  92*   < > = values in this interval not displayed.   BMET Recent Labs    08/23/21 0632 08/23/21 0640 08/23/21 0926 08/23/21 1624 08/23/21 2257 08/24/21 0400  NA 139 140 137   < > 144 147*  K 5.2* 5.3* 5.7*  --   --  6.1*  CL 99 99  --   --   --  109  CO2 29  --   --   --   --  25  GLUCOSE 131* 129*  --   --   --  134*  BUN 76* 74*  --   --   --  86*  CREATININE 9.25* 10.60*  --   --   --  10.76*  CALCIUM 8.5*  --   --   --   --  8.2*   < > = values in this interval not displayed.    Studies/Results: CT ANGIO HEAD W OR WO CONTRAST  Result Date: 08/23/2021 CLINICAL DATA:  Code stroke for left-sided weakness  EXAM: CT ANGIOGRAPHY HEAD AND NECK TECHNIQUE: Multidetector CT imaging of the head and neck was performed using the standard protocol during bolus administration of intravenous contrast. Multiplanar CT image reconstructions and MIPs were obtained to evaluate the vascular anatomy. Carotid stenosis measurements (when applicable) are obtained utilizing NASCET criteria, using the distal internal carotid diameter as the denominator. RADIATION DOSE REDUCTION: This exam was performed according to the departmental dose-optimization program which includes automated exposure control, adjustment of the mA and/or kV according to patient size and/or use of iterative reconstruction technique. CONTRAST:  1050mOMNIPAQUE IOHEXOL 350 MG/ML SOLN COMPARISON:  Noncontrast head CT from earlier today FINDINGS: CTA NECK FINDINGS Aortic arch: No acute finding Right carotid system: Mild atheromatous plaque at the bifurcation. No stenosis or ulceration. Left carotid system: Mild calcified plaque at the bifurcation. No stenosis or ulceration. Vertebral arteries: Proximal subclavian atherosclerosis without flow reducing stenosis. Codominant vertebral arteries there are smoothly contoured and widely patent to the dura. Skeleton: No acute or aggressive finding Other neck: No acute finding Upper chest: Fine interstitial  opacity at the apices symmetrically. Review of the MIP images confirms the above findings CTA HEAD FINDINGS Anterior circulation: Calcified plaque along the carotid siphons. No occlusion, beading, or proximal flow limiting stenosis. Negative for aneurysm. Posterior circulation: Calcified plaque along the V4 segments. No branch occlusion, beading, or aneurysm. No evidence of vascular malformation. Equivocal for spot sign in the lower aspect of the hematoma. Venous sinuses: Diffusely patent Anatomic variants: None significant Review of the MIP images confirms the above findings IMPRESSION: 1. No arterial lesion underlying the right  thalamic hematoma. Equivocal for spot sign which would be an adverse finding suggesting continued growth. 2. Atherosclerosis without flow limiting stenosis of major vessels. 3. Interstitial opacity at the apices, age-indeterminate. Recommend chest radiograph. Electronically Signed   By: Jorje Guild M.D.   On: 08/23/2021 07:12   CT HEAD WO CONTRAST (5MM)  Result Date: 08/23/2021 CLINICAL DATA:  Hydrocephalus, EVD placement EXAM: CT HEAD WITHOUT CONTRAST TECHNIQUE: Contiguous axial images were obtained from the base of the skull through the vertex without intravenous contrast. RADIATION DOSE REDUCTION: This exam was performed according to the departmental dose-optimization program which includes automated exposure control, adjustment of the mA and/or kV according to patient size and/or use of iterative reconstruction technique. COMPARISON:  08/23/2021 9:54 a.m. FINDINGS: Brain: Interval placement of a right frontal approach ventriculostomy catheter, which terminates in the left basal ganglia. Interval slight decrease in the size of the lateral ventricles and third ventricle. Small amount of pneumocephalus along the right frontal convexity, not unexpected post EVD placement. Redemonstrated acute hematoma in the right thalamus, which measures 3.7 x 2.3 x 3.8 cm (AP x TR x CC) (series 3, image 21 and series 5, image 41), previously 3.6 x 2.5 x 3.8 cm, overall unchanged compared to earlier on the same day. Again noted is a small amount intraventricular hemorrhage in the cerebral aqueduct and fourth ventricle as well as layering in the occipital horns. Unchanged mass effect on the right lateral ventricle and third ventricle. Vascular: No hyperdense vessel. Atherosclerotic calcifications in the intracranial carotid and vertebral arteries. Skull: Right frontal burr hole. Negative for fracture or focal lesion. Sinuses/Orbits: No acute finding. Other: Air within the right frontal soft tissues, not unexpected  postoperatively. IMPRESSION: 1. Status post interval placement of a right frontal approach ventriculostomy catheter, with slight interval decrease in the size of the lateral and third ventricles. 2. Overall unchanged size of the right thalamic hematoma compared to earlier on the same day. Electronically Signed   By: Merilyn Baba M.D.   On: 08/23/2021 22:52   CT HEAD WO CONTRAST (5MM)  Result Date: 08/23/2021 CLINICAL DATA:  Stroke, hemorrhagic EXAM: CT HEAD WITHOUT CONTRAST TECHNIQUE: Contiguous axial images were obtained from the base of the skull through the vertex without intravenous contrast. RADIATION DOSE REDUCTION: This exam was performed according to the departmental dose-optimization program which includes automated exposure control, adjustment of the mA and/or kV according to patient size and/or use of iterative reconstruction technique. COMPARISON:  CT head May 29, 23. FINDINGS: Brain: Significantly increased size of a an acute hematoma in the right thalamus, now measuring 3.6 x 2.5 x 3.8 Cm (previously 2.9 x 1.7 x 3.1 cm). Small volume intraventricular extension of hemorrhage with hemorrhage noted in the cerebral aqueduct and fourth ventricle. Regional mass effect with effacement of the third ventricle. No evidence of acute large vascular territory infarct. Development of rounding of the temporal horns, compatible with mild hydrocephalus. Vascular: Residual contrast within the vascular system. Calcific  atherosclerosis Skull: No acute fracture. Sinuses/Orbits: Mild paranasal sinus mucosal thickening. No acute orbital findings. Other: No mastoid effusions. IMPRESSION: 1. Interval significant increase in size of a an acute hematoma in the right thalamus, now measuring 3.6 x 2.5 x 3.8 cm (previously 2.9 x 1.7 x 3.1 cm). Small volume intraventricular extension of hemorrhage. 2. Interval development of mild hydrocephalus. These results will be called to the ordering clinician or representative by the  Radiologist Assistant, and communication documented in the PACS or [employer]. Electronically Signed   By: Margaretha Sheffield M.D.   On: 08/23/2021 10:08   CT ANGIO NECK W OR WO CONTRAST  Result Date: 08/23/2021 CLINICAL DATA:  Code stroke for left-sided weakness EXAM: CT ANGIOGRAPHY HEAD AND NECK TECHNIQUE: Multidetector CT imaging of the head and neck was performed using the standard protocol during bolus administration of intravenous contrast. Multiplanar CT image reconstructions and MIPs were obtained to evaluate the vascular anatomy. Carotid stenosis measurements (when applicable) are obtained utilizing NASCET criteria, using the distal internal carotid diameter as the denominator. RADIATION DOSE REDUCTION: This exam was performed according to the departmental dose-optimization program which includes automated exposure control, adjustment of the mA and/or kV according to patient size and/or use of iterative reconstruction technique. CONTRAST:  17m OMNIPAQUE IOHEXOL 350 MG/ML SOLN COMPARISON:  Noncontrast head CT from earlier today FINDINGS: CTA NECK FINDINGS Aortic arch: No acute finding Right carotid system: Mild atheromatous plaque at the bifurcation. No stenosis or ulceration. Left carotid system: Mild calcified plaque at the bifurcation. No stenosis or ulceration. Vertebral arteries: Proximal subclavian atherosclerosis without flow reducing stenosis. Codominant vertebral arteries there are smoothly contoured and widely patent to the dura. Skeleton: No acute or aggressive finding Other neck: No acute finding Upper chest: Fine interstitial opacity at the apices symmetrically. Review of the MIP images confirms the above findings CTA HEAD FINDINGS Anterior circulation: Calcified plaque along the carotid siphons. No occlusion, beading, or proximal flow limiting stenosis. Negative for aneurysm. Posterior circulation: Calcified plaque along the V4 segments. No branch occlusion, beading, or aneurysm. No  evidence of vascular malformation. Equivocal for spot sign in the lower aspect of the hematoma. Venous sinuses: Diffusely patent Anatomic variants: None significant Review of the MIP images confirms the above findings IMPRESSION: 1. No arterial lesion underlying the right thalamic hematoma. Equivocal for spot sign which would be an adverse finding suggesting continued growth. 2. Atherosclerosis without flow limiting stenosis of major vessels. 3. Interstitial opacity at the apices, age-indeterminate. Recommend chest radiograph. Electronically Signed   By: JJorje GuildM.D.   On: 08/23/2021 07:12   DG Chest Portable 1 View  Result Date: 08/23/2021 CLINICAL DATA:  Endotracheal and nasogastric tube placement Stroke ESRD Diabetes EXAM: PORTABLE CHEST - 1 VIEW COMPARISON:  07/09/2019 FINDINGS: Unchanged cardiomegaly. Diffuse bilateral lung opacities. Endotracheal tube tip located 1.8 cm of the carina. Nasogastric tube terminates in the left upper quadrant. IMPRESSION: 1. Mild cardiomegaly. 2. Diffuse bilateral lung opacities may be due to pulmonary edema or multifocal pneumonia. Electronically Signed   By: FMiachel RouxM.D.   On: 08/23/2021 07:51   DG Abd Portable 1V  Result Date: 08/23/2021 CLINICAL DATA:  NG tube placement. EXAM: PORTABLE ABDOMEN - 1 VIEW COMPARISON:  CT, 07/26/2019. FINDINGS: Nasogastric tube passes well below the diaphragm, tip in the mid to distal stomach. Normal bowel gas pattern. IMPRESSION: Well-positioned nasal/orogastric tube. Electronically Signed   By: DLajean ManesM.D.   On: 08/23/2021 15:59   ECHOCARDIOGRAM COMPLETE  Result Date:  08/23/2021    ECHOCARDIOGRAM REPORT   Patient Name:   [facility] Date of Exam: 08/23/2021 Medical Rec #:  [id number]          Height:       67.0 in Accession #:    [id number]         Weight:       230.4 lb Date of Birth:  [birth year]           BSA:          2.148 m Patient Age:    80 years           BP:           134/62 mmHg Patient Gender: F                   HR:           60 bpm. Exam Location:  Inpatient Procedure: 2D Echo, Color Doppler and Cardiac Doppler Indications:    Stroke i63.9  History:        Patient has prior history of Echocardiogram examinations, most                 recent 07/09/2019. CHF; Risk Factors:Hypertension and Diabetes.  Sonographer:    Raquel Sarna Senior RDCS Referring Phys: [id number] ASHISH ARORA  Sonographer Comments: Scanned supine on artificial respirator. IMPRESSIONS  1. Left ventricular ejection fraction, by estimation, is 60 to 65%. The left ventricle has normal function. The left ventricle has no regional wall motion abnormalities. There is severe left ventricular hypertrophy. Left ventricular diastolic parameters  are consistent with Grade II diastolic dysfunction (pseudonormalization). Elevated left atrial pressure.  2. Right ventricular systolic function is normal. The right ventricular size is normal.  3. Left atrial size was moderately dilated.  4. The mitral valve is normal in structure. Trivial mitral valve regurgitation. No evidence of mitral stenosis.  5. The aortic valve is tricuspid. Aortic valve regurgitation is not visualized. Aortic valve sclerosis is present, with no evidence of aortic valve stenosis.  6. The inferior vena cava is normal in size with greater than 50% respiratory variability, suggesting right atrial pressure of 3 mmHg. FINDINGS  Left Ventricle: Left ventricular ejection fraction, by estimation, is 60 to 65%. The left ventricle has normal function. The left ventricle has no regional wall motion abnormalities. The left ventricular internal cavity size was normal in size. There is  severe left ventricular hypertrophy. Left ventricular diastolic parameters are consistent with Grade II diastolic dysfunction (pseudonormalization). Elevated left atrial pressure. Right Ventricle: The right ventricular size is normal. Right ventricular systolic function is normal. Left Atrium: Left atrial size was moderately  dilated. Right Atrium: Right atrial size was normal in size. Pericardium: There is no evidence of pericardial effusion. Mitral Valve: The mitral valve is normal in structure. Mild mitral annular calcification. Trivial mitral valve regurgitation. No evidence of mitral valve stenosis. Tricuspid Valve: The tricuspid valve is normal in structure. Tricuspid valve regurgitation is trivial. No evidence of tricuspid stenosis. Aortic Valve: The aortic valve is tricuspid. Aortic valve regurgitation is not visualized. Aortic valve sclerosis is present, with no evidence of aortic valve stenosis. Pulmonic Valve: The pulmonic valve was normal in structure. Pulmonic valve regurgitation is not visualized. No evidence of pulmonic stenosis. Aorta: The aortic root is normal in size and structure. Venous: The inferior vena cava is normal in size with greater than 50% respiratory variability, suggesting right atrial pressure of 3 mmHg. IAS/Shunts: No atrial level shunt  detected by color flow Doppler.  LEFT VENTRICLE PLAX 2D LVIDd:         4.90 cm   Diastology LVIDs:         3.10 cm   LV e' medial:    3.81 cm/s LV PW:         1.40 cm   LV E/e' medial:  30.4 LV IVS:        1.50 cm   LV e' lateral:   5.11 cm/s LVOT diam:     2.00 cm   LV E/e' lateral: 22.7 LV SV:         89 LV SV Index:   41 LVOT Area:     3.14 cm  RIGHT VENTRICLE RV S prime:     7.83 cm/s TAPSE (M-mode): 2.2 cm LEFT ATRIUM             Index        RIGHT ATRIUM           Index LA diam:        3.80 cm 1.77 cm/m   RA Area:     15.80 cm LA Vol (A2C):   83.7 ml 38.98 ml/m  RA Volume:   40.40 ml  18.81 ml/m LA Vol (A4C):   65.0 ml 30.27 ml/m LA Biplane Vol: 78.2 ml 36.41 ml/m  AORTIC VALVE LVOT Vmax:   97.40 cm/s LVOT Vmean:  63.800 cm/s LVOT VTI:    0.283 m  AORTA Ao Root diam: 3.20 cm Ao Asc diam:  3.20 cm MV E velocity: 116.00 cm/s MV A velocity: 78.40 cm/s   SHUNTS MV E/A ratio:  1.48         Systemic VTI:  0.28 m                             Systemic Diam: 2.00 cm  Kirk Ruths MD Electronically signed by Kirk Ruths MD Signature Date/Time: 08/23/2021/10:50:25 AM    Final    CT HEAD CODE STROKE WO CONTRAST  Result Date: 08/23/2021 CLINICAL DATA:  Code stroke.  Left facial droop EXAM: CT HEAD WITHOUT CONTRAST TECHNIQUE: Contiguous axial images were obtained from the base of the skull through the vertex without intravenous contrast. RADIATION DOSE REDUCTION: This exam was performed according to the departmental dose-optimization program which includes automated exposure control, adjustment of the mA and/or kV according to patient size and/or use of iterative reconstruction technique. COMPARISON:  Brain MRI 04/02/2019 FINDINGS: Brain: 33 x 18 x 17 mm acute hemorrhage centered in the right thalamus with mild intraventricular extension into the right lateral and fourth ventricle. This location is usually related to hypertensive bleeds. The hematoma tracks towards the midbrain on the right. No evidence of regional acute infarction or mass. No hydrocephalus. Vascular: No hyperdense vessel or unexpected calcification. Skull: Normal. Negative for fracture or focal lesion. Sinuses/Orbits: No acute finding.  Bilateral cataract resection Other: Critical Value/emergent results were called by telephone at the time of interpretation on 08/23/2021 at 7:03 am to provider [facility] , who is already aware IMPRESSION: 5 cc acute hematoma in the right thalamus with intraventricular extension. No hydrocephalus. Electronically Signed   By: Jorje Guild M.D.   On: 08/23/2021 07:04    Assessment/Plan: 56 year old female who presented to the ED with nausea, vomiting, and left-sided hemiparesis who was found to have a large thalamic bleed with intraventricular extension. While in the emergency department, her neurological status declined  and required emergent intubation for airway protection.  Follow-up CT scan today revealed progression of the hemorrhage with extension into upper  midbrain with development of hydrocephalus. EVD placed on 08/23/2021. Follow up CT head after EVD placement revealed stable right thalamic bleed with a slight decrease in the lateral and third ventricles. On exam this morning, she was able to follow commands on her right side, but continues to have no spontaneous or purposeful movement on the left.   -Continue EVD at 10 cm H20   LOS: 1 day     Marvis Moeller, DNP, AGNP-C Neurosurgery Nurse Practitioner  [facility] [place] [address], [address], [place], [place] [address] P: [phone]    F: [phone]  08/24/2021 8:18 AM

## 2021-08-24 NOTE — Progress Notes (Signed)
Pt receives out-pt HD at [facility] on MWF. Pt has a 6:30 chair time. Will assist as needed.  Melven Sartorius Renal Navigator [phone]

## 2021-08-24 NOTE — Progress Notes (Signed)
Patient noted excessive movement of right leg, when asked if she is experiencing pain, she nodded up and down. Denies cramping. Decrease goal rate to 1500. 165m NS bolus given.

## 2021-08-24 NOTE — Progress Notes (Signed)
OT Cancellation Note  Patient Details Name: Janet Mitchell MRN: [id number] DOB: 1965/05/17   Cancelled Treatment:    Reason Eval/Treat Not Completed: Patient not medically ready (Discussed with nsg. Pt is Sedated/intubated. Will assess when medically appropriate.)  Malakie Balis,HILLARY 08/24/2021, 8:32 AM Maurie Boettcher, OT/L   Acute OT Clinical Specialist Acute Rehabilitation Services Pager [phone] Office [phone]

## 2021-08-24 NOTE — Progress Notes (Signed)
Pt's 2000 sodium re-drew d/t no label. Dr. Curly Sh[place] informed.

## 2021-08-24 NOTE — Progress Notes (Addendum)
STROKE TEAM PROGRESS NOTE   INTERVAL HISTORY Hemodialysis currently running. Following commands on the right side. EVD placed 08/23/2021 approx 10cc/hr of drainage.   Vitals:   08/24/21 0615 08/24/21 0630 08/24/21 0645 08/24/21 0700  BP:      Pulse: 73 73 73 74  Resp: '16 16 16 16  '$ Temp: 97.9 F (36.6 C) 97.9 F (36.6 C) 97.9 F (36.6 C) 97.9 F (36.6 C)  TempSrc:      SpO2: 99% 98% 99% 99%  Weight:      Height:       CBC:  Recent Labs  Lab 08/23/21 0632 08/23/21 0640 08/23/21 0926 08/24/21 0400  WBC 4.0  --   --  5.9  NEUTROABS 2.2  --   --   --   HGB 10.9*   < > 12.6 10.6*  HCT 35.8*   < > 37.0 32.6*  MCV 81.0  --   --  77.4*  PLT 90*  --   --  92*   < > = values in this interval not displayed.   Basic Metabolic Panel:  Recent Labs  Lab 08/23/21 0632 08/23/21 0640 08/23/21 0926 08/23/21 1020 08/23/21 1624 08/23/21 2257 08/24/21 0400  NA 139 140 137  --    < > 144 147*  K 5.2* 5.3* 5.7*  --   --   --  6.1*  CL 99 99  --   --   --   --  109  CO2 29  --   --   --   --   --  25  GLUCOSE 131* 129*  --   --   --   --  134*  BUN 76* 74*  --   --   --   --  86*  CREATININE 9.25* 10.60*  --   --   --   --  10.76*  CALCIUM 8.5*  --   --   --   --   --  8.2*  MG  --   --   --   --   --   --  2.2  PHOS  --   --   --  5.3*  --   --  5.4*   < > = values in this interval not displayed.   Lipid Panel:  Recent Labs  Lab 08/23/21 1020  CHOL 178  TRIG 383*  HDL 63  CHOLHDL 2.8  VLDL 77*  LDLCALC 38   HgbA1c:  Recent Labs  Lab 08/23/21 1020  HGBA1C 6.2*   Urine Drug Screen: No results for input(s): LABOPIA, COCAINSCRNUR, LABBENZ, AMPHETMU, THCU, LABBARB in the last 168 hours.  Alcohol Level  Recent Labs  Lab 08/23/21 0632  ETH <10    IMAGING past 24 hours CT HEAD WO CONTRAST (5MM)  Result Date: 08/23/2021 CLINICAL DATA:  Hydrocephalus, EVD placement EXAM: CT HEAD WITHOUT CONTRAST TECHNIQUE: Contiguous axial images were obtained from the base of the  skull through the vertex without intravenous contrast. RADIATION DOSE REDUCTION: This exam was performed according to the departmental dose-optimization program which includes automated exposure control, adjustment of the mA and/or kV according to patient size and/or use of iterative reconstruction technique. COMPARISON:  08/23/2021 9:54 a.m. FINDINGS: Brain: Interval placement of a right frontal approach ventriculostomy catheter, which terminates in the left basal ganglia. Interval slight decrease in the size of the lateral ventricles and third ventricle. Small amount of pneumocephalus along the right frontal convexity, not unexpected post EVD placement. Redemonstrated  acute hematoma in the right thalamus, which measures 3.7 x 2.3 x 3.8 cm (AP x TR x CC) (series 3, image 21 and series 5, image 41), previously 3.6 x 2.5 x 3.8 cm, overall unchanged compared to earlier on the same day. Again noted is a small amount intraventricular hemorrhage in the cerebral aqueduct and fourth ventricle as well as layering in the occipital horns. Unchanged mass effect on the right lateral ventricle and third ventricle. Vascular: No hyperdense vessel. Atherosclerotic calcifications in the intracranial carotid and vertebral arteries. Skull: Right frontal burr hole. Negative for fracture or focal lesion. Sinuses/Orbits: No acute finding. Other: Air within the right frontal soft tissues, not unexpected postoperatively. IMPRESSION: 1. Status post interval placement of a right frontal approach ventriculostomy catheter, with slight interval decrease in the size of the lateral and third ventricles. 2. Overall unchanged size of the right thalamic hematoma compared to earlier on the same day. Electronically Signed   By: Merilyn Baba M.D.   On: 08/23/2021 22:52   CT HEAD WO CONTRAST (5MM)  Result Date: 08/23/2021 CLINICAL DATA:  Stroke, hemorrhagic EXAM: CT HEAD WITHOUT CONTRAST TECHNIQUE: Contiguous axial images were obtained from the  base of the skull through the vertex without intravenous contrast. RADIATION DOSE REDUCTION: This exam was performed according to the departmental dose-optimization program which includes automated exposure control, adjustment of the mA and/or kV according to patient size and/or use of iterative reconstruction technique. COMPARISON:  CT head May 29, 23. FINDINGS: Brain: Significantly increased size of a an acute hematoma in the right thalamus, now measuring 3.6 x 2.5 x 3.8 Cm (previously 2.9 x 1.7 x 3.1 cm). Small volume intraventricular extension of hemorrhage with hemorrhage noted in the cerebral aqueduct and fourth ventricle. Regional mass effect with effacement of the third ventricle. No evidence of acute large vascular territory infarct. Development of rounding of the temporal horns, compatible with mild hydrocephalus. Vascular: Residual contrast within the vascular system. Calcific atherosclerosis Skull: No acute fracture. Sinuses/Orbits: Mild paranasal sinus mucosal thickening. No acute orbital findings. Other: No mastoid effusions. IMPRESSION: 1. Interval significant increase in size of a an acute hematoma in the right thalamus, now measuring 3.6 x 2.5 x 3.8 cm (previously 2.9 x 1.7 x 3.1 cm). Small volume intraventricular extension of hemorrhage. 2. Interval development of mild hydrocephalus. These results will be called to the ordering clinician or representative by the Radiologist Assistant, and communication documented in the PACS or [employer]. Electronically Signed   By: Margaretha Sheffield M.D.   On: 08/23/2021 10:08   DG Chest Portable 1 View  Result Date: 08/23/2021 CLINICAL DATA:  Endotracheal and nasogastric tube placement Stroke ESRD Diabetes EXAM: PORTABLE CHEST - 1 VIEW COMPARISON:  07/09/2019 FINDINGS: Unchanged cardiomegaly. Diffuse bilateral lung opacities. Endotracheal tube tip located 1.8 cm of the carina. Nasogastric tube terminates in the left upper quadrant. IMPRESSION: 1. Mild  cardiomegaly. 2. Diffuse bilateral lung opacities may be due to pulmonary edema or multifocal pneumonia. Electronically Signed   By: Miachel Roux M.D.   On: 08/23/2021 07:51   DG Abd Portable 1V  Result Date: 08/23/2021 CLINICAL DATA:  NG tube placement. EXAM: PORTABLE ABDOMEN - 1 VIEW COMPARISON:  CT, 07/26/2019. FINDINGS: Nasogastric tube passes well below the diaphragm, tip in the mid to distal stomach. Normal bowel gas pattern. IMPRESSION: Well-positioned nasal/orogastric tube. Electronically Signed   By: Lajean Manes M.D.   On: 08/23/2021 15:59   ECHOCARDIOGRAM COMPLETE  Result Date: 08/23/2021    ECHOCARDIOGRAM REPORT  Patient Name:   ALIECE HONOLD [facility] Date of Exam: 08/23/2021 Medical Rec #:  [id number]          Height:       67.0 in Accession #:    [id number]         Weight:       230.4 lb Date of Birth:  01-15-1966           BSA:          2.148 m Patient Age:    66 years           BP:           134/62 mmHg Patient Gender: F                  HR:           60 bpm. Exam Location:  Inpatient Procedure: 2D Echo, Color Doppler and Cardiac Doppler Indications:    Stroke i63.9  History:        Patient has prior history of Echocardiogram examinations, most                 recent 07/09/2019. CHF; Risk Factors:Hypertension and Diabetes.  Sonographer:    Raquel Sarna Senior RDCS Referring Phys: [id number] ASHISH ARORA  Sonographer Comments: Scanned supine on artificial respirator. IMPRESSIONS  1. Left ventricular ejection fraction, by estimation, is 60 to 65%. The left ventricle has normal function. The left ventricle has no regional wall motion abnormalities. There is severe left ventricular hypertrophy. Left ventricular diastolic parameters  are consistent with Grade II diastolic dysfunction (pseudonormalization). Elevated left atrial pressure.  2. Right ventricular systolic function is normal. The right ventricular size is normal.  3. Left atrial size was moderately dilated.  4. The mitral valve is normal in structure.  Trivial mitral valve regurgitation. No evidence of mitral stenosis.  5. The aortic valve is tricuspid. Aortic valve regurgitation is not visualized. Aortic valve sclerosis is present, with no evidence of aortic valve stenosis.  6. The inferior vena cava is normal in size with greater than 50% respiratory variability, suggesting right atrial pressure of 3 mmHg. FINDINGS  Left Ventricle: Left ventricular ejection fraction, by estimation, is 60 to 65%. The left ventricle has normal function. The left ventricle has no regional wall motion abnormalities. The left ventricular internal cavity size was normal in size. There is  severe left ventricular hypertrophy. Left ventricular diastolic parameters are consistent with Grade II diastolic dysfunction (pseudonormalization). Elevated left atrial pressure. Right Ventricle: The right ventricular size is normal. Right ventricular systolic function is normal. Left Atrium: Left atrial size was moderately dilated. Right Atrium: Right atrial size was normal in size. Pericardium: There is no evidence of pericardial effusion. Mitral Valve: The mitral valve is normal in structure. Mild mitral annular calcification. Trivial mitral valve regurgitation. No evidence of mitral valve stenosis. Tricuspid Valve: The tricuspid valve is normal in structure. Tricuspid valve regurgitation is trivial. No evidence of tricuspid stenosis. Aortic Valve: The aortic valve is tricuspid. Aortic valve regurgitation is not visualized. Aortic valve sclerosis is present, with no evidence of aortic valve stenosis. Pulmonic Valve: The pulmonic valve was normal in structure. Pulmonic valve regurgitation is not visualized. No evidence of pulmonic stenosis. Aorta: The aortic root is normal in size and structure. Venous: The inferior vena cava is normal in size with greater than 50% respiratory variability, suggesting right atrial pressure of 3 mmHg. IAS/Shunts: No atrial level shunt detected by color flow Doppler.   LEFT VENTRICLE  PLAX 2D LVIDd:         4.90 cm   Diastology LVIDs:         3.10 cm   LV e' medial:    3.81 cm/s LV PW:         1.40 cm   LV E/e' medial:  30.4 LV IVS:        1.50 cm   LV e' lateral:   5.11 cm/s LVOT diam:     2.00 cm   LV E/e' lateral: 22.7 LV SV:         89 LV SV Index:   41 LVOT Area:     3.14 cm  RIGHT VENTRICLE RV S prime:     7.83 cm/s TAPSE (M-mode): 2.2 cm LEFT ATRIUM             Index        RIGHT ATRIUM           Index LA diam:        3.80 cm 1.77 cm/m   RA Area:     15.80 cm LA Vol (A2C):   83.7 ml 38.98 ml/m  RA Volume:   40.40 ml  18.81 ml/m LA Vol (A4C):   65.0 ml 30.27 ml/m LA Biplane Vol: 78.2 ml 36.41 ml/m  AORTIC VALVE LVOT Vmax:   97.40 cm/s LVOT Vmean:  63.800 cm/s LVOT VTI:    0.283 m  AORTA Ao Root diam: 3.20 cm Ao Asc diam:  3.20 cm MV E velocity: 116.00 cm/s MV A velocity: 78.40 cm/s   SHUNTS MV E/A ratio:  1.48         Systemic VTI:  0.28 m                             Systemic Diam: 2.00 cm Kirk Ruths MD Electronically signed by Kirk Ruths MD Signature Date/Time: 08/23/2021/10:50:25 AM    Final     PHYSICAL EXAM  Physical Exam  Constitutional: Appears well-developed and well-nourished.  Cardiovascular: Normal rate and regular rhythm.  Respiratory: Effort normal, non-labored breathing  Neuro: Mental Status: Intubated sedated with propofol Cranial Nerves: Left gaze preference, blink to threat more on the right than the left. Cough, gag, corneal, and oculocephalic reflex intact Follows commands with right upper and lower extremities Motor: Tone is normal. Bulk is normal.  Left arm flaccid, Left lower extremity 1/5 Right upper and lower extremities 5/5 Sensory: Sensation is symmetric to light touch and temperature in the arms and legs. No extinction to DSS present.    ASSESSMENT/PLAN [name] is a 56 y.o. female with history of ESRD on HD MWF, CHF, HTN, DM presenting with nausea, vomiting, left side weakness present upon waking.   Intubated emergently at [facility] and transferred to [facility] on 5/59.  NSGY consulted, EVD placement 5/29. MRI Pending Hemodialysis per nephrology  ICH:  Right thalamic ICH with IVH and mild hydrocephalus s/p EVD, likely secondary to uncontrolled hypertension Code Stroke CT head Right thalamic IVH previously 2.9 x 1.7 x 3.1 cm 5/29 Head CT 1000- Interval significant increase in size of a an acute hematoma in the right thalamus, now measuring 3.6 x 2.5 x 3.8 cm. Small volume intraventricular extension of hemorrhage. Mild hydrocephalus. 5/29 Head CT 2200- decrease in size of lateral and third ventricles s/p EVD placement  CTA head & neck No arterial lesion underlying the right thalamic hematoma. Equivocal for spot sign which  would be an adverse finding suggesting continued growth MRI  Pending 2D Echo EF 60-65%, consistent with grade II diastolic dysfunction EEG - mild diffuse encephalopathy LDL 38 HgbA1c 6.2 VTE prophylaxis - SCDs aspirin 81 mg daily prior to admission, now on No antithrombotic.  Therapy recommendations:  Pending Disposition:  Pending  Obstructive Hydrocephalus NSGY on board 5/29- EVD placement  Follow up CT shows slightly decrease in size of lateral and third ventricle   Cerebral edema On 3% saline @ 75 Given ESRD, try to avoid fluid overload Na goal 150-155 Na 142->146->141 Will get PICC line Consider 23.4% saline bolus to minimize fluid volume  Acute Hypoxic Respiratory failure Intubated 5/29 CCM managing vent  Hypertensive emergency Home meds:  labetolol, hydralazine, losartan Stable BP less than 160 On losartan 100, labetalol 200 3 times daily, hydralazine 50 3 times daily Long-term BP goal normotensive Still on cleviprex, taper off as able  End Stage Renal Failure on hemodialysis MWF Nephrology consulted Dialysis done 5/30 Monitor BMP/electrolytes  Diabetes Mellitus A1c 6.2, controlled CBGs SSI Close PCP follow-up  Other Stroke Risk  Factors Obesity, Body mass index is 36.05 kg/m., BMI >/= 30 associated with increased stroke risk, recommend weight loss, diet and exercise as appropriate  Congestive heart failure  Other Active Problems Hyperkalemia  K 6.1, s/p HD and [facility] day # 1  Patient seen and examined by NP/APP with MD. MD to update note as needed.   Janine Ores, DNP, FNP-BC [facility] Pager: [phone]  ATTENDING NOTE: I reviewed above note and agree with the assessment and plan. Pt was seen and examined.   56 year old female with history of ESRD on hemodialysis, CHF, hypertension, diabetes admitted for nausea vomiting, left-sided weakness.  Intubated for airway protection.  CT showed right thalamic ICH with IVH.  CT head and neck no AVM or aneurysm.  CT repeat showed increased ICH volume and mild hydrocephalus.  Neurosurgery consulted, status post EVD.  Repeat CT showed slightly decreased ventricular size status post EVD.  EF 60 to 65%.  LDL 38, A1c 6.2.  K6.1, creatinine 10.6.  Platelet 90.  Neuro - intubated on sedation, eyes closed but open on voice, able to follow simple commands. With eye opening, eyes in right gaze preference position, barely cross midline, blinking to visual threat on the right but not on the left, PERRL. Corneal reflex present, gag and cough present. Breathing over the vent.  Facial symmetry not able to test due to ET tube.  Tongue protrusion not cooperative.  Right side upper and lower extremity spontaneous movement, left upper and lower extremity hemiplegia. Sensation, coordination and gait not tested.  Etiology for patient ICH and IVH likely due to hypertensive emergency.  Currently still on Cleviprex, will resume BP meds to taper off Keppra as able.  EVD patent, neurosurgery on board.  On 3% saline for cerebral edema, given ESRD on dialysis, will try to limit IV fluid.  Will have central line placed, then consider 23.4% saline bolus to keep sodium at goal.   Ventilation management per CCM.  MRI pending to evaluate brainstem compression.   For detailed assessment and plan, please refer to above as I have made changes wherever appropriate.   [name], MD PhD Stroke Neurology 08/24/2021 7:20 PM  This patient is critically ill due to [place], IVH, hydrocephalus, hypertensive emergency, renal failure, hyperkalemia, cerebral edema and at significant risk of neurological worsening, death form brain herniation, obstructive hydrocephalus, respiratory failure. This patient's care requires constant monitoring  of vital signs, hemodynamics, respiratory and cardiac monitoring, review of multiple databases, neurological assessment, discussion with family, other specialists and medical decision making of high complexity. I spent 40 minutes of neurocritical care time in the care of this patient.  I discussed with Dr. Lynetta Mare.     To contact Stroke Continuity provider, please refer to [email or web address] After hours, contact General Neurology

## 2021-08-24 NOTE — Assessment & Plan Note (Addendum)
Large right thalamic bleed Patient able to follow commands on right side.  Left side plegic. Appears to be able to protect airway Awake but still has lid apraxia.  Follows commands MRI shows stable bleed and ventricular size, small increase in surrounding edema compared to admission CT.   - Tracheostomy in place for airway protection.  - Passed successful MBS.  - No suctioning requirements  - Start capping trials and decannulate if tolerates 48h.

## 2021-08-24 NOTE — Progress Notes (Signed)
Shaking leg vigorously,husband at bedside stating that when she does that she is cramping pt nodded to confirm will continue to monitor

## 2021-08-24 NOTE — Procedures (Signed)
Patient Name: Nai Borromeo Pinnix  MRN: [id number]  Epilepsy Attending: Lora Havens  Referring Physician/Provider: Amie Portland, MD Date: 08/24/2021 Duration: 23.47 mins  Patient history: 78 old female with nontraumatic intracerebral hemorrhage affecting the left side and altered mental status.  EEG evaluate for seizure.  Level of alertness: Awake, asleep  AEDs during EEG study: None  Technical aspects: This EEG study was done with scalp electrodes positioned according to the 10-20 International system of electrode placement. Electrical activity was acquired at a sampling rate of '500Hz'$  and reviewed with a high frequency filter of '70Hz'$  and a low frequency filter of '1Hz'$ . EEG data were recorded continuously and digitally stored.   Description: The posterior dominant rhythm consists of 8 Hz activity of moderate voltage (25-35 uV) seen predominantly in posterior head regions, symmetric and reactive to eye opening and eye closing. Sleep was characterized by vertex waves, sleep spindles (12 to 14 Hz), maximal frontocentral region. EEG showed intermittent generalized 3 to 6 Hz theta-delta slowing. Hyperventilation and photic stimulation were not performed.     ABNORMALITY - Intermittent slow, generalized  IMPRESSION: This study is suggestive of mild diffuse encephalopathy, nonspecific etiology. No seizures or epileptiform discharges were seen throughout the recording.  Mykle [place] Sarks

## 2021-08-24 NOTE — Progress Notes (Signed)
NAME:  Janet Mitchell, MRN:  [id number], DOB:  1965-09-16, LOS: 1 ADMISSION DATE:  08/23/2021, CONSULTATION DATE:  08/23/2021 REFERRING MD:  Dr. Rory Percy - Neuro CHIEF COMPLAINT: Code Stroke  History of Present Illness:  56 year old woman who presented to [facility] 5/29 with reported left-sided weakness, slurred speech, nausea/vomiting. LKN 6 hours prior to arrival. PMHx significant for HTN, CHF (Echo 07/2021 EF 60-65%, severe LVH, G2DD), T2DM (c/b diabetic neuropathy with subsequent L great toe amputation 2/2 ulcer/infection), ESRD (on HD MWF via [facility]).  On arrival to ED, patient is somnolent with slight disconjugate gaze with near flaccid left upper arm and left leg weakness. BP 222/107. CT Head with 5cc acute hematoma in the right thalamus with intraventricular extension. Neurology consulted and patient transferred to [facility] for further evaluation. PCCM consulted for admission.  On arrival to unit patient is intubated, pupils 4m bilaterally and non-reactive. Withdrawals in all extremities, does not follow commands, on Propofol and Cleviprex gtt.    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, MRI Brain pending  Interim History / Subjective:  Improving neuro exam, will follow commands with RUE/RLE More awake on minimal sedation Withdraws LUE/LLE to pain, otherwise not moving HD today in-room, tolerating well MRI Brain pending  Objective:  Blood pressure 120/65, pulse 76, temperature 97.9 F (36.6 C), temperature source Esophageal, resp. rate 18, height '5\' 7"'$  (1.702 m), weight 104.4 kg, SpO2 98 %.    Vent Mode: PRVC FiO2 (%):  [30 %-60 %] 30 % Set Rate:  [16 bmp] 16 bmp Vt Set:  [490 mL] 490 mL PEEP:  [5 cmH20]  5 cmH20 Plateau Pressure:  [18 cmH20-21 cmH20] 18 cmH20   Intake/Output Summary (Last 24 hours) at 08/24/2021 0817 Last data filed at 08/24/2021 0800 Gross per 24 hour  Intake 2183.61 ml  Output 220 ml  Net 1963.61 ml    Filed Weights   08/23/21 0728 08/24/21 0600  Weight: 104.5 kg 104.4 kg   Physical Examination: General: Acutely ill-appearing middle-aged woman in NAD. Resting in bed on HD. HEENT: [place]/AT, anicteric sclera, PERRL, moist mucous membranes. Neuro:  Lightly sedated.  Responds to verbal stimuli. Following commands consistently with RUE/RLE. Unilateral L-sided neglect noted. +Corneal, +Cough, and +Gag  CV: RRR, no m/g/r. PULM: Breathing even and unlabored on vent (PEEP 5, FiO2 30%). Lung fields with coarse breath sounds in anterior fields. GI: Soft, nontender, nondistended. Normoactive bowel sounds. Extremities: Bilateral symmetric trace LE edema noted. L great toe surgically absent. Skin: Warm/dry, no rashes.  Resolved Hospital Problem List:     Assessment & Plan:   Right Thalamus ICH with IVH and Obstructive Hydrocephalus  - Neurology/Stroke team primary - S/p EVD placement, NSGY following - F/u MRI Brain 5/30 - Frequent neurochecks - Neuroprotective measures: HOB > 30 degrees, normoglycemia, normothermia, electrolytes WNL  HTN Emergency  - Goal SBP < 140 per Stroke team - Cleviprex titrated to goal SBP - Cardiac monitoring - Initiate PO medications to allow downtitration of Cleviprex as able  Respiratory Insufficiency in setting of above  CXR with pulmonary edema vs. Consolidation.  - Continue full vent support (4-8cc/kg IBW) - Wean FiO2 for O2 sat > 90% - Daily WUA/SBT, currently precluded by mental status -  VAP bundle - Pulmonary hygiene - PAD protocol for sedation: Precedex for goal RASS 0 to -1  ESRD on HD MWF  Slight Hyperkalemia, K 6.0 - Nephrology consulted, appreciate recs - HD per Nephro, bedside today - Trend BMP - Replete electrolytes as  indicated - Monitor I&Os - F/u urine studies - Avoid nephrotoxic agents as able - Ensure adequate renal perfusion  DM  - CBGs Q4H - SSI - Goal CBG 140-180  Foot Ulcer, +Pseudomonas on recent culture  Was recently prescribed Cipro, will change to Zosyn for now to also cover for possible aspiration event - Continue Zosyn - BCx as able  Best Practice (right click and "Reselect all SmartList Selections" daily)   Diet/type: NPO DVT prophylaxis: SCD GI prophylaxis: PPI Lines: N/A Foley:  N/A Code Status:  full code Last date of multidisciplinary goals of care discussion [5/29]  Critical care time: 40 minutes    Lestine Mount, PA-C [facility] 08/24/21 8:17 AM  Please see [email or web address] for pager details.  From 7A-7P if no response, please call [phone] After hours, please call ELink [phone]

## 2021-08-25 ENCOUNTER — Inpatient Hospital Stay (HOSPITAL_COMMUNITY): Payer: 59

## 2021-08-25 DIAGNOSIS — I610 Nontraumatic intracerebral hemorrhage in hemisphere, subcortical: Secondary | ICD-10-CM | POA: Diagnosis not present

## 2021-08-25 DIAGNOSIS — I161 Hypertensive emergency: Secondary | ICD-10-CM | POA: Diagnosis not present

## 2021-08-25 DIAGNOSIS — G911 Obstructive hydrocephalus: Secondary | ICD-10-CM | POA: Diagnosis not present

## 2021-08-25 DIAGNOSIS — N186 End stage renal disease: Secondary | ICD-10-CM | POA: Diagnosis not present

## 2021-08-25 DIAGNOSIS — I639 Cerebral infarction, unspecified: Secondary | ICD-10-CM | POA: Diagnosis not present

## 2021-08-25 DIAGNOSIS — J9601 Acute respiratory failure with hypoxia: Secondary | ICD-10-CM | POA: Diagnosis not present

## 2021-08-25 LAB — CBC
HCT: 32.7 % — ABNORMAL LOW (ref 36.0–46.0)
Hemoglobin: 10.3 g/dL — ABNORMAL LOW (ref 12.0–15.0)
MCH: 25.1 pg — ABNORMAL LOW (ref 26.0–34.0)
MCHC: 31.5 g/dL (ref 30.0–36.0)
MCV: 79.6 fL — ABNORMAL LOW (ref 80.0–100.0)
Platelets: 91 10*3/uL — ABNORMAL LOW (ref 150–400)
RBC: 4.11 MIL/uL (ref 3.87–5.11)
RDW: 17.4 % — ABNORMAL HIGH (ref 11.5–15.5)
WBC: 5.2 10*3/uL (ref 4.0–10.5)
nRBC: 0 % (ref 0.0–0.2)

## 2021-08-25 LAB — BASIC METABOLIC PANEL
Anion gap: 10 (ref 5–15)
BUN: 45 mg/dL — ABNORMAL HIGH (ref 6–20)
CO2: 25 mmol/L (ref 22–32)
Calcium: 8.2 mg/dL — ABNORMAL LOW (ref 8.9–10.3)
Chloride: 112 mmol/L — ABNORMAL HIGH (ref 98–111)
Creatinine, Ser: 7.01 mg/dL — ABNORMAL HIGH (ref 0.44–1.00)
GFR, Estimated: 7 mL/min — ABNORMAL LOW (ref >60)
Glucose, Bld: 145 mg/dL — ABNORMAL HIGH (ref 70–99)
Potassium: 4.2 mmol/L (ref 3.5–5.1)
Sodium: 147 mmol/L — ABNORMAL HIGH (ref 135–145)

## 2021-08-25 LAB — GLUCOSE, CAPILLARY
Glucose-Capillary: 128 mg/dL — ABNORMAL HIGH (ref 70–99)
Glucose-Capillary: 137 mg/dL — ABNORMAL HIGH (ref 70–99)
Glucose-Capillary: 149 mg/dL — ABNORMAL HIGH (ref 70–99)
Glucose-Capillary: 150 mg/dL — ABNORMAL HIGH (ref 70–99)
Glucose-Capillary: 159 mg/dL — ABNORMAL HIGH (ref 70–99)
Glucose-Capillary: 159 mg/dL — ABNORMAL HIGH (ref 70–99)

## 2021-08-25 LAB — SODIUM
Sodium: 147 mmol/L — ABNORMAL HIGH (ref 135–145)
Sodium: 146 mmol/L — ABNORMAL HIGH (ref 135–145)
Sodium: 153 mmol/L — ABNORMAL HIGH (ref 135–145)
Sodium: 154 mmol/L — ABNORMAL HIGH (ref 135–145)

## 2021-08-25 LAB — TRIGLYCERIDES: Triglycerides: 176 mg/dL — ABNORMAL HIGH (ref <150)

## 2021-08-25 LAB — MAGNESIUM: Magnesium: 1.9 mg/dL (ref 1.7–2.4)

## 2021-08-25 LAB — PHOSPHORUS: Phosphorus: 5.8 mg/dL — ABNORMAL HIGH (ref 2.5–4.6)

## 2021-08-25 IMAGING — MR MR HEAD W/O CM
6 of 10 series · 28 of 48 positions shown · non-contrast
Comparison: Multiple CT studies [DATE].  MRI [DATE]

CLINICAL DATA: Neuro deficit, acute, stroke suspected. Nontraumatic
intracranial hemorrhage. Altered mental status.

EXAM:
MRI HEAD WITHOUT CONTRAST
TECHNIQUE: Multiplanar, multiecho pulse sequences of the brain and surrounding
structures were obtained without intravenous contrast.

[Series 4: DWI · axial · 3.0mm · 0.94mm/px · z∈[-43,+100]mm · 9 of 98 slices shown (1 of 2)]
[im 1/98]
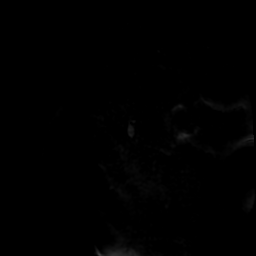
[im 13/98]
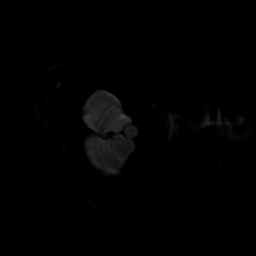
[im 25/98]
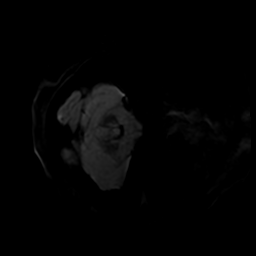
[im 37/98]
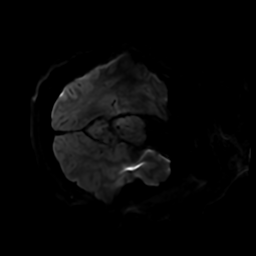
[im 49/98]
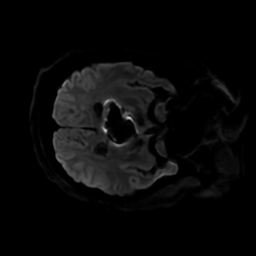
[im 61/98]
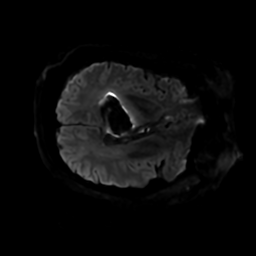
[im 73/98]
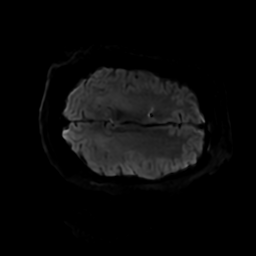
[im 85/98]
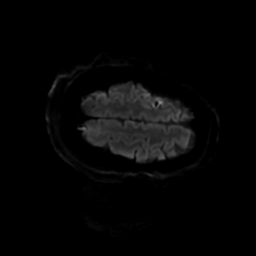
[im 98/98]
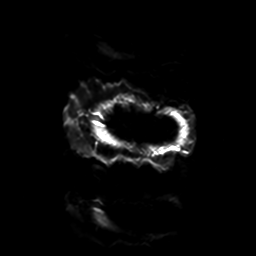

[Series 5: DWI · oblique · 4.0mm · 0.94mm/px · 6 of 61 slices shown (2 of 2)]
[im 1/61]
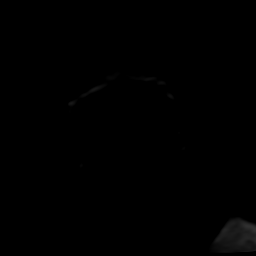
[im 13/61]
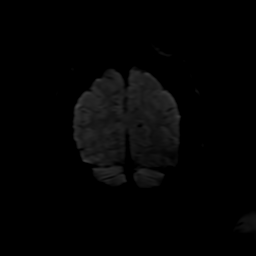
[im 25/61]
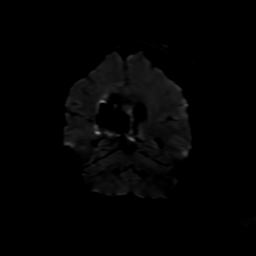
[im 37/61]
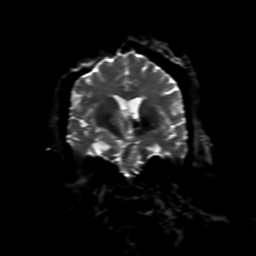
[im 49/61]
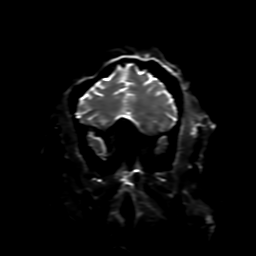
[im 61/61]
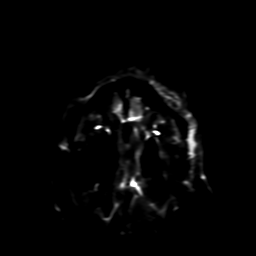

[Series 6: FLAIR · oblique · 5.0mm · 0.23mm/px · 2 of 23 slices shown (1 of 2)]
[im 1/23]
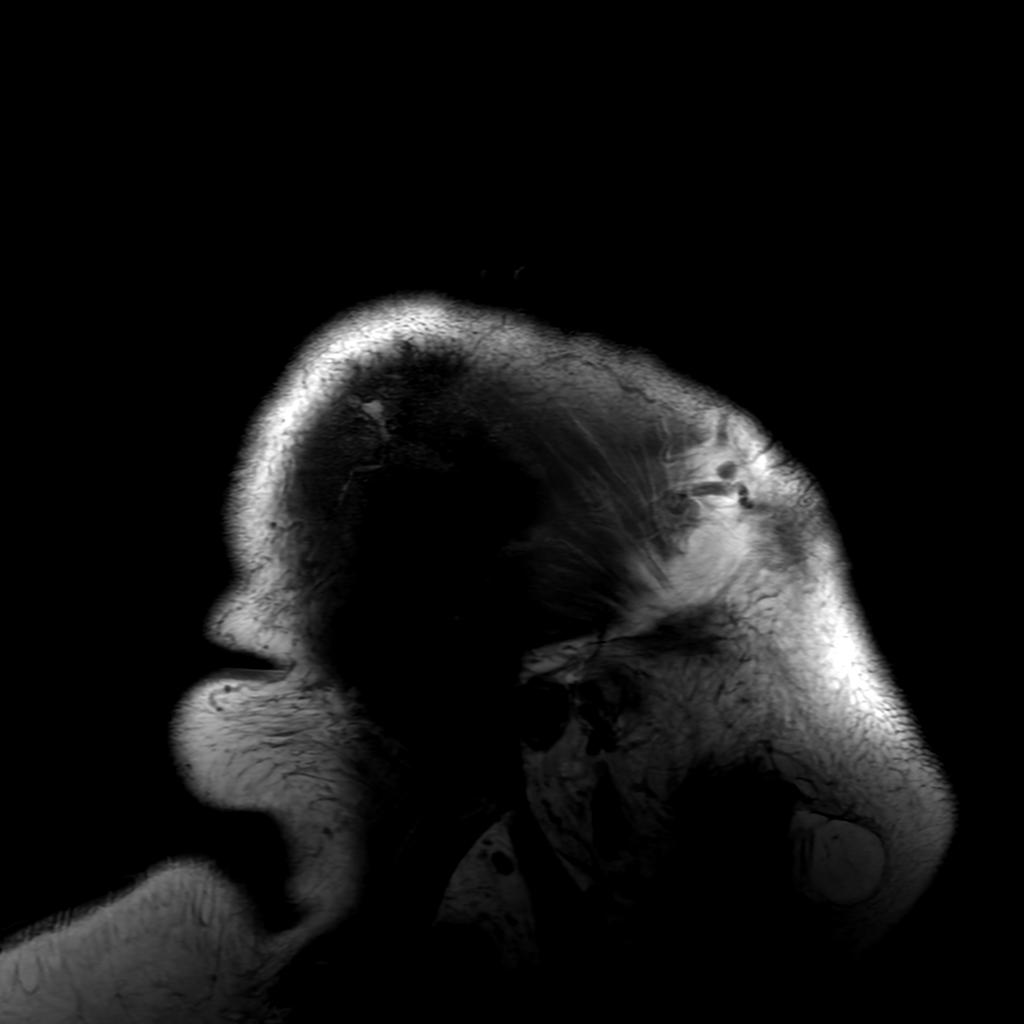
[im 23/23]
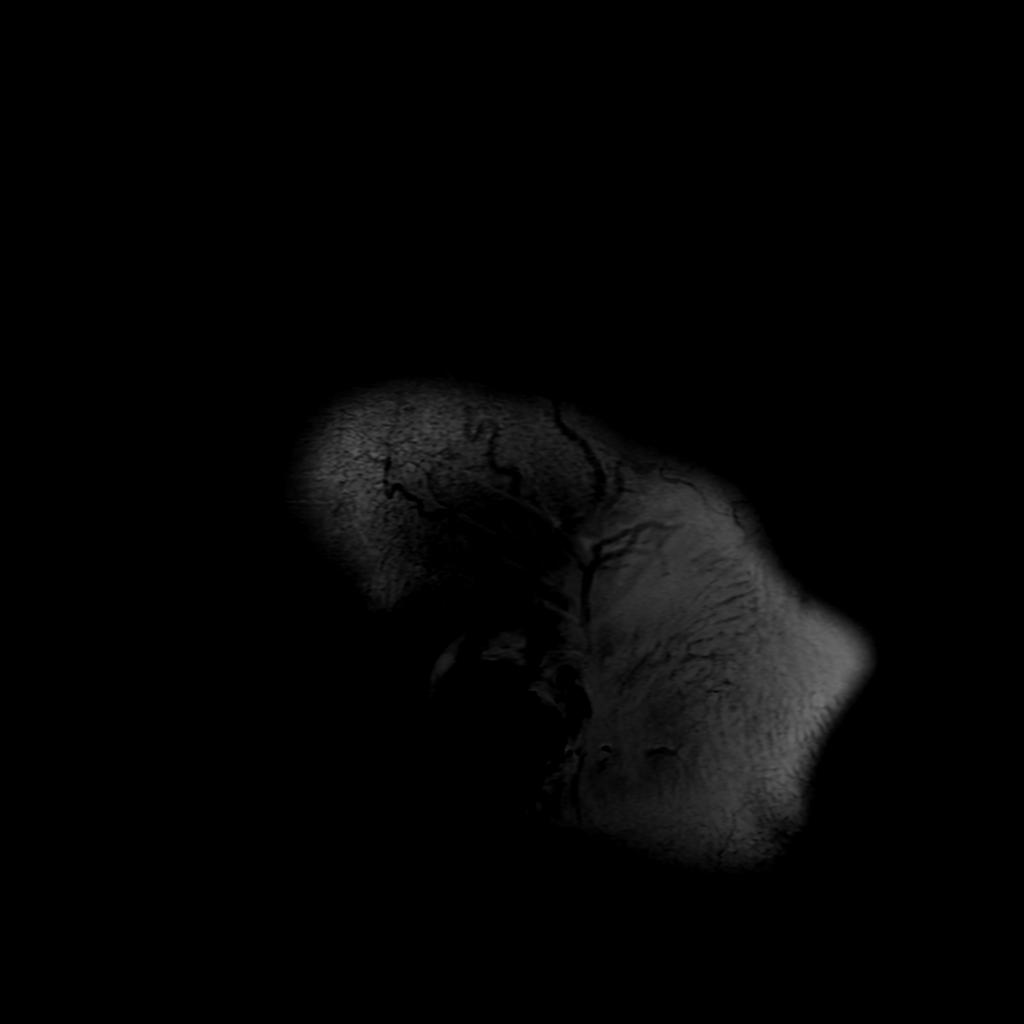

[Series 8: FLAIR · axial · 4.0mm · 0.45mm/px · z∈[-54,+88]mm · 3 of 34 slices shown (2 of 2)]
[im 1/34]
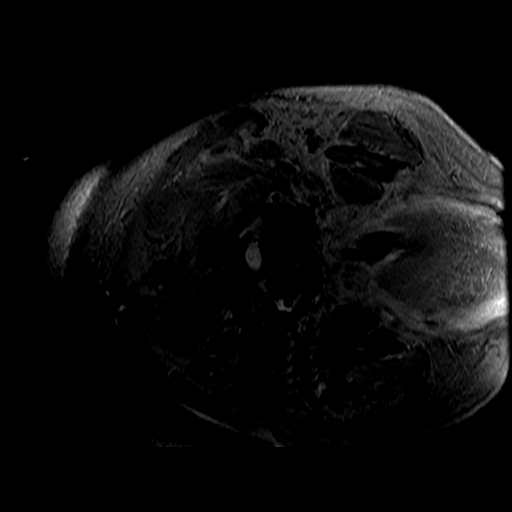
[im 17/34]
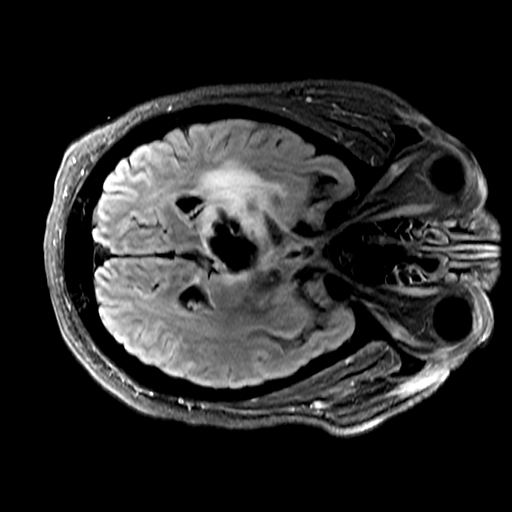
[im 34/34]
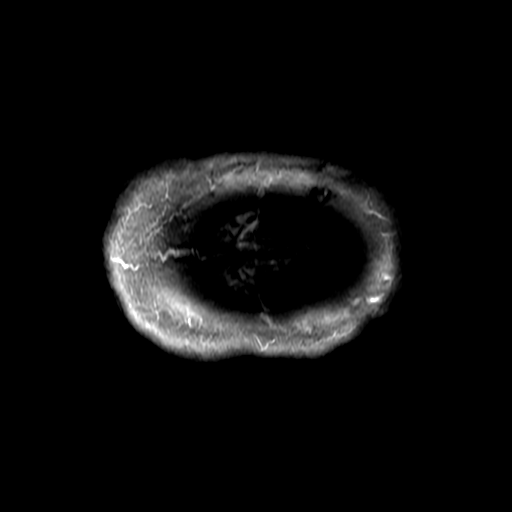

[Series 450: ADC · axial · 3.0mm · 0.94mm/px · z∈[-43,+97]mm · 5 of 48 slices shown (1 of 2)]
[im 1/48]
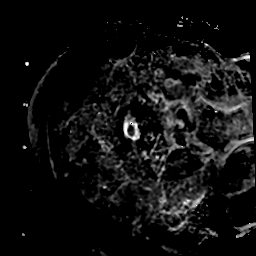
[im 12/48]
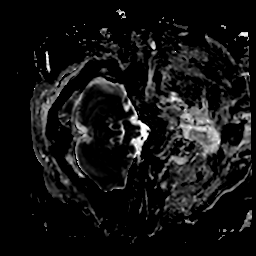
[im 24/48]
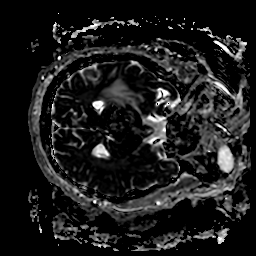
[im 36/48]
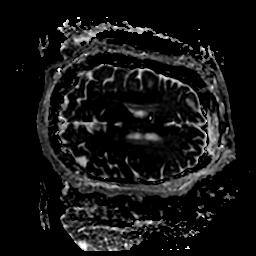
[im 48/48]
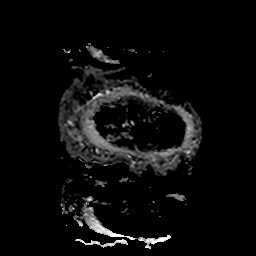

[Series 550: ADC · oblique · 4.0mm · 0.94mm/px · 3 of 30 slices shown (2 of 2)]
[im 1/30]
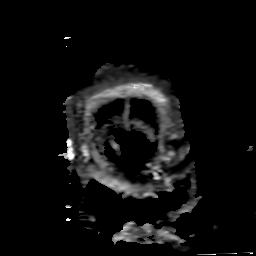
[im 15/30]
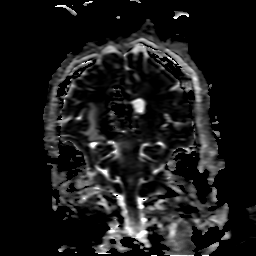
[im 30/30]
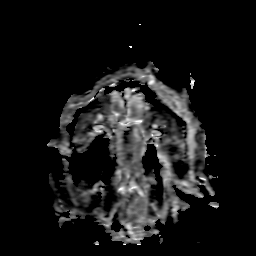

[28 of 48 positions shown; findings below may reference images not displayed]

FINDINGS: Brain: Right thalamic intraparenchymal hematoma as seen previously,
proximally maximal dimension 3.6 cm. I do not believe the hematoma
has increased in size. There is surrounding edema including
extension into the right midbrain and pons. Small amount of
intraventricular blood as seen previously. Right frontal
ventriculostomy enters the frontal horn of the right lateral
ventricle and crosses the midline towards the left. Ventricular size
is stable. Numerous other punctate foci of hemosiderin deposition
scattered throughout the brain consistent with old hypertensive
microhemorrhages. There is a subcentimeter acute infarction
affecting the cingulate gyrus on the right.

Vascular: Major vessels at the base of the brain show flow.

Skull and upper cervical spine: Otherwise negative

Sinuses/Orbits: Clear/normal

Other: None
IMPRESSION: Comparing techniques, no change in size of the right thalamic
intraparenchymal hemorrhage is suspected, maximal dimension 3.6 cm
by MRI. Surrounding edema, likely slightly more prominent than on
the most recent CT.

Right ventriculostomy remains in place without evidence of
developing hydrocephalus. Small amount of intraventricular blood
appears similar.

Subcentimeter acute infarction of the right cingulate gyrus.

Old small vessel insults with hemosiderin deposition consistent with
hypertensive microangiopathy.

## 2021-08-25 MED ORDER — AMLODIPINE BESYLATE 10 MG PO TABS
10.0000 mg | ORAL_TABLET | Freq: Every day | ORAL | Status: DC
  Administered 2021-08-25 – 2021-09-05 (×8): 10 mg
  Filled 2021-08-25 (×8): qty 1

## 2021-08-25 MED ORDER — DIPHENHYDRAMINE HCL 50 MG/ML IJ SOLN
25.0000 mg | Freq: Four times a day (QID) | INTRAMUSCULAR | Status: DC | PRN
  Administered 2021-08-25 – 2021-08-26 (×2): 25 mg via INTRAVENOUS
  Filled 2021-08-25 (×2): qty 1

## 2021-08-25 NOTE — Plan of Care (Signed)
  Problem: Education: Goal: Knowledge of disease or condition will improve Outcome: Progressing Goal: Knowledge of secondary prevention will improve (SELECT ALL) Outcome: Progressing   Problem: Coping: Goal: Will verbalize positive feelings about self Outcome: Progressing   Problem: Health Behavior/Discharge Planning: Goal: Ability to manage health-related needs will improve Outcome: Progressing   Problem: Self-Care: Goal: Ability to participate in self-care as condition permits will improve Outcome: Progressing   Problem: Nutrition: Goal: Risk of aspiration will decrease Outcome: Progressing Goal: Dietary intake will improve Outcome: Progressing   Problem: Intracerebral Hemorrhage Tissue Perfusion: Goal: Complications of Intracerebral Hemorrhage will be minimized Outcome: Progressing

## 2021-08-25 NOTE — Progress Notes (Signed)
NAME:  Janet Mitchell, MRN:  [id number], DOB:  [birth year], LOS: 2 ADMISSION DATE:  08/23/2021, CONSULTATION DATE:  08/23/2021 REFERRING MD:  Dr. Rory Percy - Neuro CHIEF COMPLAINT: Code Stroke  History of Present Illness:  56 year old woman who presented to [facility] 5/29 with reported left-sided weakness, slurred speech, nausea/vomiting. LKN 6 hours prior to arrival. PMHx significant for HTN, CHF (Echo 07/2021 EF 60-65%, severe LVH, G2DD), T2DM (c/b diabetic neuropathy with subsequent L great toe amputation 2/2 ulcer/infection), ESRD (on HD MWF via [facility] TDC).  On arrival to ED, patient is somnolent with slight disconjugate gaze with near flaccid left upper arm and left leg weakness. BP 222/107. CT Head with 5cc acute hematoma in the right thalamus with intraventricular extension. Neurology consulted and patient transferred to [facility] for further evaluation. PCCM consulted for admission.  On arrival to unit patient is intubated, pupils 70m bilaterally and non-reactive. Withdrawals in all extremities, does not follow commands, on Propofol and Cleviprex gtt.    Pertinent Medical History:  ESRD with HD MWF, CHF, HTN, DM, s/p left great toe amputation secondary to ulcer/infection   Significant Hospital Events: Including procedures, antibiotic start and stop dates in addition to other pertinent events   [facility] HD, more awake, following commands with RUE/RLE  Interim History / Subjective:  Awake/alert off sedation, nodding appropriately to questions Following commands with RUE/RLE Muscle fasciculations/tremoring of RUE noted, patient is able to follow commands during tremors Tolerated HD well yesterday Remains on HTS 3% Plan for SBT until MRI today if able to tolerate Re-sedate for MRI, then possible extubation today vs. tomorrow  Objective:  Blood pressure (!) 106/55, pulse 70, temperature 97.7 F (36.5 C), resp. rate 16, height '5\' 7"'$   (1.702 m), weight 104.4 kg, SpO2 96 %.    Vent Mode: PRVC FiO2 (%):  [30 %] 30 % Set Rate:  [16 bmp] 16 bmp Vt Set:  [490 mL] 490 mL PEEP:  [5 cmH20] 5 cmH20 Plateau Pressure:  [16 cmH20-18 cmH20] 17 cmH20   Intake/Output Summary (Last 24 hours) at 08/25/2021 0802 Last data filed at 08/25/2021 0720 Gross per 24 hour  Intake 2878.67 ml  Output 656.9 ml  Net 2221.77 ml    Filed Weights   08/23/21 0728 08/24/21 0600  Weight: 104.5 kg 104.4 kg   Physical Examination: General: Acutely ill-appearing middle-aged woman in NAD. HEENT: Normocephalic, EVD in place. Anicteric sclera, PERRL, moist mucous membranes. ETT/OGT in place. Neuro: Awake, alert. Nodding appropriately to questions. Responds to verbal stimuli. Following commands consistently with RUE/RLE. Tremor/muscle fasciculations of RUE, shoulder/hand. Unilateral L-sided neglect noted. +Corneal, +Cough, and +Gag. CV: RRR, no m/g/r. PULM: Breathing even and unlabored on vent (PEEP 5, FiO2 30%). Lung fields CTAB. GI: Soft, nontender, nondistended. Normoactive bowel sounds. Extremities: Trace bilateral symmetric LE edema noted. Skin: Warm/dry, no rashes.  Resolved Hospital Problem List:     Assessment & Plan:   Right Thalamus ICH with IVH and Obstructive Hydrocephalus - Neurology/Stroke team primary - S/p EVD placement, NSGY following - F/u MRI Brain 5/31 - Frequent neurochecks - Neuroprotective measures: HOB > 30 degrees, normoglycemia, normothermia, electrolytes WNL  Hyponatremia Cerebral edema - Continue HTS 3% per Neuro/Stroke - Goal Na 150-155 - Currently 147  HTN Emergency  - Goal SBP < 160 per Stroke - Cleviprex titrated to goal SBP - Cardiac monitoring - Continue PO antihypertensives, resumed 5/30  Respiratory Insufficiency in setting of above  CXR  with pulmonary edema vs. Consolidation. - Continue full vent support (4-8cc/kg IBW), weaning/SBT today 5/31 - Wean FiO2 for O2 sat > 90% - Daily WUA/SBT - VAP  bundle - Pulmonary hygiene - PAD protocol for sedation: Precedex for goal RASS 0 to -1  ESRD on HD MWF  Slight Hyperkalemia, K 6.0 - Nephrology consulted, appreciate recs - HD per Nephro, last 5/30 - Trend BMP - Replete electrolytes as indicated - Monitor I&Os - F/u urine studies - Avoid nephrotoxic agents as able - Ensure adequate renal perfusion  DM - CBG Q4H - SSI - Goal CBG 140-180  Foot Ulcer, +Pseudomonas on recent culture  Was recently prescribed Cipro, will change to Zosyn for now to also cover for possible aspiration event - Continue Zosyn - Follow BCx  Best Practice: (right click and "Reselect all SmartList Selections" daily)   Diet/type: NPO DVT prophylaxis: SCD GI prophylaxis: PPI Lines: N/A Foley:  N/A Code Status:  full code Last date of multidisciplinary goals of care discussion [5/29]  Critical care time: 37 minutes    Lestine Mount, PA-C [facility] 08/25/21 8:02 AM  Please see [email or web address] for pager details.  From 7A-7P if no response, please call [phone] After hours, please call ELink [phone]

## 2021-08-25 NOTE — Progress Notes (Signed)
Subjective: Patient reports on vent with Precedex gtt. She arouses to voice and is able to follow commands. No acute events overnight.  Objective: Vital signs in last 24 hours: Temp:  [97.2 F (36.2 C)-99 F (37.2 C)] 99 F (37.2 C) (05/31 1030) Pulse Rate:  [68-181] 75 (05/31 1030) Resp:  [14-26] 16 (05/31 1030) BP: (72-144)/(48-88) 119/67 (05/31 1030) SpO2:  [81 %-100 %] 96 % (05/31 1030) Arterial Line BP: (131-158)/(58-72) 136/67 (05/31 1030) FiO2 (%):  [30 %] 30 % (05/31 1026)  Intake/Output from previous day: 05/30 0701 - 05/31 0700 In: 2939.1 [I.V.:2795.4; IV Piggyback:143.7] Out: 666.9 [Drains:196] Intake/Output this shift: Total I/O In: 332.4 [I.V.:332.4] Out: 42 [Drains:42]  Physical Exam: Patient intubated and sedated on Precedex. NAD. She is not alert but will open eyes to voice and follows simple commands on the right. No purposeful movement on the left. Pupils 26m NR.  GCS 9. EVD site is intact with good pulsatile flow.  Lab Results: Recent Labs    08/24/21 0400 08/25/21 0551  WBC 5.9 5.2  HGB 10.6* 10.3*  HCT 32.6* 32.7*  PLT 92* 91*   BMET Recent Labs    08/24/21 0400 08/24/21 1329 08/25/21 0551 08/25/21 0821  NA 147*   < > 147* 146*  K 6.1*  --  4.2  --   CL 109  --  112*  --   CO2 25  --  25  --   GLUCOSE 134*  --  145*  --   BUN 86*  --  45*  --   CREATININE 10.76*  --  7.01*  --   CALCIUM 8.2*  --  8.2*  --    < > = values in this interval not displayed.    Studies/Results: CT HEAD WO CONTRAST (5MM)  Result Date: 08/23/2021 CLINICAL DATA:  Hydrocephalus, EVD placement EXAM: CT HEAD WITHOUT CONTRAST TECHNIQUE: Contiguous axial images were obtained from the base of the skull through the vertex without intravenous contrast. RADIATION DOSE REDUCTION: This exam was performed according to the departmental dose-optimization program which includes automated exposure control, adjustment of the mA and/or kV according to patient size and/or use of  iterative reconstruction technique. COMPARISON:  08/23/2021 9:54 a.m. FINDINGS: Brain: Interval placement of a right frontal approach ventriculostomy catheter, which terminates in the left basal ganglia. Interval slight decrease in the size of the lateral ventricles and third ventricle. Small amount of pneumocephalus along the right frontal convexity, not unexpected post EVD placement. Redemonstrated acute hematoma in the right thalamus, which measures 3.7 x 2.3 x 3.8 cm (AP x TR x CC) (series 3, image 21 and series 5, image 41), previously 3.6 x 2.5 x 3.8 cm, overall unchanged compared to earlier on the same day. Again noted is a small amount intraventricular hemorrhage in the cerebral aqueduct and fourth ventricle as well as layering in the occipital horns. Unchanged mass effect on the right lateral ventricle and third ventricle. Vascular: No hyperdense vessel. Atherosclerotic calcifications in the intracranial carotid and vertebral arteries. Skull: Right frontal burr hole. Negative for fracture or focal lesion. Sinuses/Orbits: No acute finding. Other: Air within the right frontal soft tissues, not unexpected postoperatively. IMPRESSION: 1. Status post interval placement of a right frontal approach ventriculostomy catheter, with slight interval decrease in the size of the lateral and third ventricles. 2. Overall unchanged size of the right thalamic hematoma compared to earlier on the same day. Electronically Signed   By: AMerilyn BabaM.D.   On: 08/23/2021 22:52  DG Abd Portable 1V  Result Date: 08/23/2021 CLINICAL DATA:  NG tube placement. EXAM: PORTABLE ABDOMEN - 1 VIEW COMPARISON:  CT, 07/26/2019. FINDINGS: Nasogastric tube passes well below the diaphragm, tip in the mid to distal stomach. Normal bowel gas pattern. IMPRESSION: Well-positioned nasal/orogastric tube. Electronically Signed   By: Lajean Manes M.D.   On: 08/23/2021 15:59   EEG adult  Result Date: 08/24/2021 Lora Havens, MD      08/24/2021 10:24 AM Patient Name: Metha Kolasa Pinnix MRN: [id number] Epilepsy Attending: Lora Havens Referring Physician/Provider: Amie [place], MD Date: 08/24/2021 Duration: 23.47 mins Patient history: 70 old female with nontraumatic intracerebral hemorrhage affecting the left side and altered mental status.  EEG evaluate for seizure. Level of alertness: Awake, asleep AEDs during EEG study: None Technical aspects: This EEG study was done with scalp electrodes positioned according to the 10-20 International system of electrode placement. Electrical activity was acquired at a sampling rate of '500Hz'$  and reviewed with a high frequency filter of '70Hz'$  and a low frequency filter of '1Hz'$ . EEG data were recorded continuously and digitally stored. Description: The posterior dominant rhythm consists of 8 Hz activity of moderate voltage (25-35 uV) seen predominantly in posterior head regions, symmetric and reactive to eye opening and eye closing. Sleep was characterized by vertex waves, sleep spindles (12 to 14 Hz), maximal frontocentral region. EEG showed intermittent generalized 3 to 6 Hz theta-delta slowing. Hyperventilation and photic stimulation were not performed.   ABNORMALITY - Intermittent slow, generalized IMPRESSION: This study is suggestive of mild diffuse encephalopathy, nonspecific etiology. No seizures or epileptiform discharges were seen throughout the recording. Priyanka O Yadav   [place] EKG SITE RITE  Result Date: 08/24/2021 If [facility] image not attached, placement could not be confirmed due to current cardiac rhythm.   Assessment/Plan: 56 year old female who presented to the ED with nausea, vomiting, and left-sided hemiparesis who was found to have a large thalamic bleed with intraventricular extension. While in the emergency department, her neurological status declined and required emergent intubation for airway protection.  Follow-up CT scan today revealed progression of the hemorrhage with  extension into upper midbrain with development of hydrocephalus. EVD placed on 08/23/2021. Follow up CT head after EVD placement revealed stable right thalamic bleed with a slight decrease in the lateral and third ventricles. On exam this morning, she was able to arouse to voice with brief eye contact and has continue to follow commands on the right side.  Left hemiplegia persists.  EVD continues to have good pulsatile flow. Plam for MRI today.   -Continue EVD at 10 cm H20    LOS: 2 days    Marvis Moeller, DNP, AGNP-C Neurosurgery Nurse Practitioner  [facility] [address], [place], [place] [address] P: [phone]    F: [phone]  08/25/2021 8:03 AM

## 2021-08-25 NOTE — Progress Notes (Signed)
Patient was transported to MRI & back to 4N31 without any complications.

## 2021-08-25 NOTE — Progress Notes (Signed)
Neurosurgery  EVD output 120 ml.  Patient intermittently following commands, plegic on left side, intubated.  Discussed plan of EVD care with husband-- will likely continue EVD for 1 week then begin wean.

## 2021-08-25 NOTE — Progress Notes (Signed)
RT NOTE: patient placed on CPAP/PSV of 5/5 at 1020.  Currently tolerating well.  Will continue to monitor.

## 2021-08-25 NOTE — Progress Notes (Signed)
SLP Cancellation Note  Patient Details Name: Janet Mitchell MRN: [id number] DOB: 12-14-1965   Cancelled treatment:       Reason Eval/Treat Not Completed: Patient not medically ready. Remains intubated. Will sign off   Dakisha Schoof, Katherene Ponto 08/25/2021, 8:07 AM

## 2021-08-25 NOTE — Progress Notes (Signed)
OT Cancellation Note  Patient Details Name: Janet Mitchell MRN: [id number] DOB: 10/01/1965   Cancelled Treatment:    Reason Eval/Treat Not Completed: Patient not medically ready. Pt currently weaning, given more sedation due to restlessness and pending MRI. OT will continue to follow for medical readiness for OT evaluation.   Merri Ray Harun Brumley 08/25/2021, 11:27 AM  Jesse Sans OTR/L Acute Rehabilitation Services Pager: [phone] Office: [phone]

## 2021-08-25 NOTE — Progress Notes (Signed)
STROKE TEAM PROGRESS NOTE   INTERVAL HISTORY No family at the bedside. Pt still intubated but eyes open, following simple commands on the right. Still has left gaze. On 3% saline with Na up to 147 overnight. Still on cleviprex.   Vitals:   08/25/21 1000 08/25/21 1015 08/25/21 1026 08/25/21 1030  BP: 125/64 126/70 137/69 119/67  Pulse: 80 78 77 75  Resp: '17 18 16 16  '$ Temp: 98.6 F (37 C) 98.8 F (37.1 C) 99 F (37.2 C) 99 F (37.2 C)  TempSrc:      SpO2: 92% 98% 98% 96%  Weight:      Height:       CBC:  Recent Labs  Lab 08/23/21 0632 08/23/21 0640 08/24/21 0400 08/25/21 0551  WBC 4.0  --  5.9 5.2  NEUTROABS 2.2  --   --   --   HGB 10.9*   < > 10.6* 10.3*  HCT 35.8*   < > 32.6* 32.7*  MCV 81.0  --  77.4* 79.6*  PLT 90*  --  92* 91*   < > = values in this interval not displayed.   Basic Metabolic Panel:  Recent Labs  Lab 08/24/21 0400 08/24/21 1329 08/25/21 0551 08/25/21 0821  NA 147*   < > 147* 146*  K 6.1*  --  4.2  --   CL 109  --  112*  --   CO2 25  --  25  --   GLUCOSE 134*  --  145*  --   BUN 86*  --  45*  --   CREATININE 10.76*  --  7.01*  --   CALCIUM 8.2*  --  8.2*  --   MG 2.2  --  1.9  --   PHOS 5.4*  --  5.8*  --    < > = values in this interval not displayed.   Lipid Panel:  Recent Labs  Lab 08/23/21 1020 08/24/21 1329 08/25/21 0821  CHOL 178  --   --   TRIG 383*   < > 176*  HDL 63  --   --   CHOLHDL 2.8  --   --   VLDL 77*  --   --   LDLCALC 38  --   --    < > = values in this interval not displayed.   HgbA1c:  Recent Labs  Lab 08/23/21 1020  HGBA1C 6.2*   Urine Drug Screen: No results for input(s): LABOPIA, COCAINSCRNUR, LABBENZ, AMPHETMU, THCU, LABBARB in the last 168 hours.  Alcohol Level  Recent Labs  Lab 08/23/21 0632  ETH <10    IMAGING past 24 hours [place] EKG SITE RITE  Result Date: 08/24/2021 If Site Rite image not attached, placement could not be confirmed due to current cardiac rhythm.   PHYSICAL EXAM  Physical  Exam  Constitutional: Appears well-developed and well-nourished.  Cardiovascular: Normal rate and regular rhythm.  Respiratory: Effort normal, non-labored breathing, on vent with weaning  Neuro - intubated on precedex, eyes open with mild left ptosis, able to follow simple commands. With eye opening, eyes in right gaze preference position, barely cross midline, blinking to visual threat on the right but not on the left, PERRL. Corneal reflex present, gag and cough present. Breathing over the vent.  Facial symmetry not able to test due to ET tube.  Tongue protrusion not cooperative.  Right side upper and lower extremity spontaneous movement, left upper and lower extremity hemiplegia with mild withdraw on the LLE  with pain. Sensation, coordination not cooperative and gait not tested.   ASSESSMENT/PLAN Ms. Janeli Lewison Pinnix is a 56 y.o. female with history of ESRD on HD MWF, CHF, HTN, DM presenting with nausea, vomiting, left side weakness present upon waking.  Intubated emergently at [facility] and transferred to [facility] on 5/59.  NSGY consulted, EVD placement 5/29. MRI Pending Hemodialysis per nephrology  ICH:  Right thalamic ICH with IVH and mild hydrocephalus s/p EVD, likely secondary to uncontrolled hypertension Code Stroke CT head Right thalamic IVH previously 2.9 x 1.7 x 3.1 cm 5/29 Head CT 1000- Interval significant increase in size of a an acute hematoma in the right thalamus, now measuring 3.6 x 2.5 x 3.8 cm. Small volume intraventricular extension of hemorrhage. Mild hydrocephalus. 5/29 Head CT 2200- decrease in size of lateral and third ventricles s/p EVD placement  CTA head & neck No arterial lesion underlying the right thalamic hematoma. Equivocal for spot sign which would be an adverse finding suggesting continued growth MRI  Pending 2D Echo EF 60-65%, consistent with grade II diastolic dysfunction EEG - mild diffuse encephalopathy LDL 38 HgbA1c 6.2 VTE prophylaxis - SCDs aspirin  81 mg daily prior to admission, now on No antithrombotic.  Therapy recommendations:  Pending Disposition:  Pending  Obstructive Hydrocephalus NSGY on board 5/29- EVD placement  Follow up CT shows slightly decrease in size of lateral and third ventricle  MRI pending  Cerebral edema On 3% saline @ 75 Given ESRD, try to avoid fluid overload Na goal 150-155 Na 142->146->141->147  Acute Hypoxic Respiratory failure Intubated 5/29 CCM managing vent On weaning today, plan for extubation tomorrow if able  Hypertensive emergency Home meds:  labetolol, hydralazine, losartan Stable BP less than 160 On losartan 100, labetalol 200 3 times daily, hydralazine 50 3 times daily Add amlodipine 10 Long-term BP goal normotensive Still on cleviprex, taper off as able  End Stage Renal Failure on hemodialysis MWF Nephrology consulted Dialysis done 5/30 Monitor BMP/electrolytes  Diabetes Mellitus A1c 6.2, controlled CBGs SSI Close PCP follow-up  Other Stroke Risk Factors Obesity, Body mass index is 36.05 kg/m., BMI >/= 30 associated with increased stroke risk, recommend weight loss, diet and exercise as appropriate  Congestive heart failure  Other Active Problems Hyperkalemia  K 6.1->4.2, s/p HD and [facility] day # 2  [name], MD PhD Stroke Neurology 08/25/2021 11:20 AM  This patient is critically ill due to [place], IVH, hydrocephalus, hypertensive emergency, renal failure, hyperkalemia, cerebral edema and at significant risk of neurological worsening, death form brain herniation, obstructive hydrocephalus, respiratory failure. This patient's care requires constant monitoring of vital signs, hemodynamics, respiratory and cardiac monitoring, review of multiple databases, neurological assessment, discussion with family, other specialists and medical decision making of high complexity. I spent 35 minutes of neurocritical care time in the care of this patient.  I discussed with Dr.  Lynetta Mare.     To contact Stroke Continuity provider, please refer to [email or web address] After hours, contact General Neurology

## 2021-08-25 NOTE — Progress Notes (Signed)
PT Cancellation Note  Patient Details Name: Janet Mitchell MRN: [id number] DOB: 16-Nov-1965   Cancelled Treatment:    Reason Eval/Treat Not Completed: Patient at procedure or test/unavailable. Pt leaving unit for MRI. PT will follow up as time allows.   Zenaida Niece 08/25/2021, 2:29 PM

## 2021-08-25 NOTE — Progress Notes (Signed)
1420: EVD clamped and drained prior to taking patient to MRI  1558: EVD leveled and unclamped upon return from MRI, no complications noted

## 2021-08-26 ENCOUNTER — Inpatient Hospital Stay (HOSPITAL_COMMUNITY): Payer: 59

## 2021-08-26 DIAGNOSIS — N186 End stage renal disease: Secondary | ICD-10-CM | POA: Diagnosis not present

## 2021-08-26 DIAGNOSIS — I610 Nontraumatic intracerebral hemorrhage in hemisphere, subcortical: Secondary | ICD-10-CM | POA: Diagnosis not present

## 2021-08-26 DIAGNOSIS — G911 Obstructive hydrocephalus: Secondary | ICD-10-CM | POA: Diagnosis not present

## 2021-08-26 DIAGNOSIS — J9601 Acute respiratory failure with hypoxia: Secondary | ICD-10-CM | POA: Diagnosis not present

## 2021-08-26 LAB — CBC
HCT: 34.7 % — ABNORMAL LOW (ref 36.0–46.0)
Hemoglobin: 11 g/dL — ABNORMAL LOW (ref 12.0–15.0)
MCH: 25.3 pg — ABNORMAL LOW (ref 26.0–34.0)
MCHC: 31.7 g/dL (ref 30.0–36.0)
MCV: 79.8 fL — ABNORMAL LOW (ref 80.0–100.0)
Platelets: 96 10*3/uL — ABNORMAL LOW (ref 150–400)
RBC: 4.35 MIL/uL (ref 3.87–5.11)
RDW: 17.9 % — ABNORMAL HIGH (ref 11.5–15.5)
WBC: 5.6 10*3/uL (ref 4.0–10.5)
nRBC: 0 % (ref 0.0–0.2)

## 2021-08-26 LAB — BASIC METABOLIC PANEL
Anion gap: 9 (ref 5–15)
BUN: 21 mg/dL — ABNORMAL HIGH (ref 6–20)
CO2: 27 mmol/L (ref 22–32)
Calcium: 8.4 mg/dL — ABNORMAL LOW (ref 8.9–10.3)
Chloride: 111 mmol/L (ref 98–111)
Creatinine, Ser: 3.91 mg/dL — ABNORMAL HIGH (ref 0.44–1.00)
GFR, Estimated: 13 mL/min — ABNORMAL LOW (ref >60)
Glucose, Bld: 108 mg/dL — ABNORMAL HIGH (ref 70–99)
Potassium: 3.5 mmol/L (ref 3.5–5.1)
Sodium: 147 mmol/L — ABNORMAL HIGH (ref 135–145)

## 2021-08-26 LAB — GLUCOSE, CAPILLARY
Glucose-Capillary: 106 mg/dL — ABNORMAL HIGH (ref 70–99)
Glucose-Capillary: 105 mg/dL — ABNORMAL HIGH (ref 70–99)
Glucose-Capillary: 102 mg/dL — ABNORMAL HIGH (ref 70–99)
Glucose-Capillary: 107 mg/dL — ABNORMAL HIGH (ref 70–99)
Glucose-Capillary: 91 mg/dL (ref 70–99)
Glucose-Capillary: 106 mg/dL — ABNORMAL HIGH (ref 70–99)

## 2021-08-26 LAB — SODIUM
Sodium: 156 mmol/L — ABNORMAL HIGH (ref 135–145)
Sodium: 150 mmol/L — ABNORMAL HIGH (ref 135–145)

## 2021-08-26 LAB — MAGNESIUM: Magnesium: 1.9 mg/dL (ref 1.7–2.4)

## 2021-08-26 LAB — PHOSPHORUS: Phosphorus: 3.1 mg/dL (ref 2.5–4.6)

## 2021-08-26 IMAGING — DX DG CHEST 1V PORT
1 series · 1 of 1 positions shown · non-contrast
Comparison: [DATE].

CLINICAL DATA: Diffuse bilateral infiltrates, dialysis patient.

EXAM:
PORTABLE CHEST 1 VIEW

[chest ap]
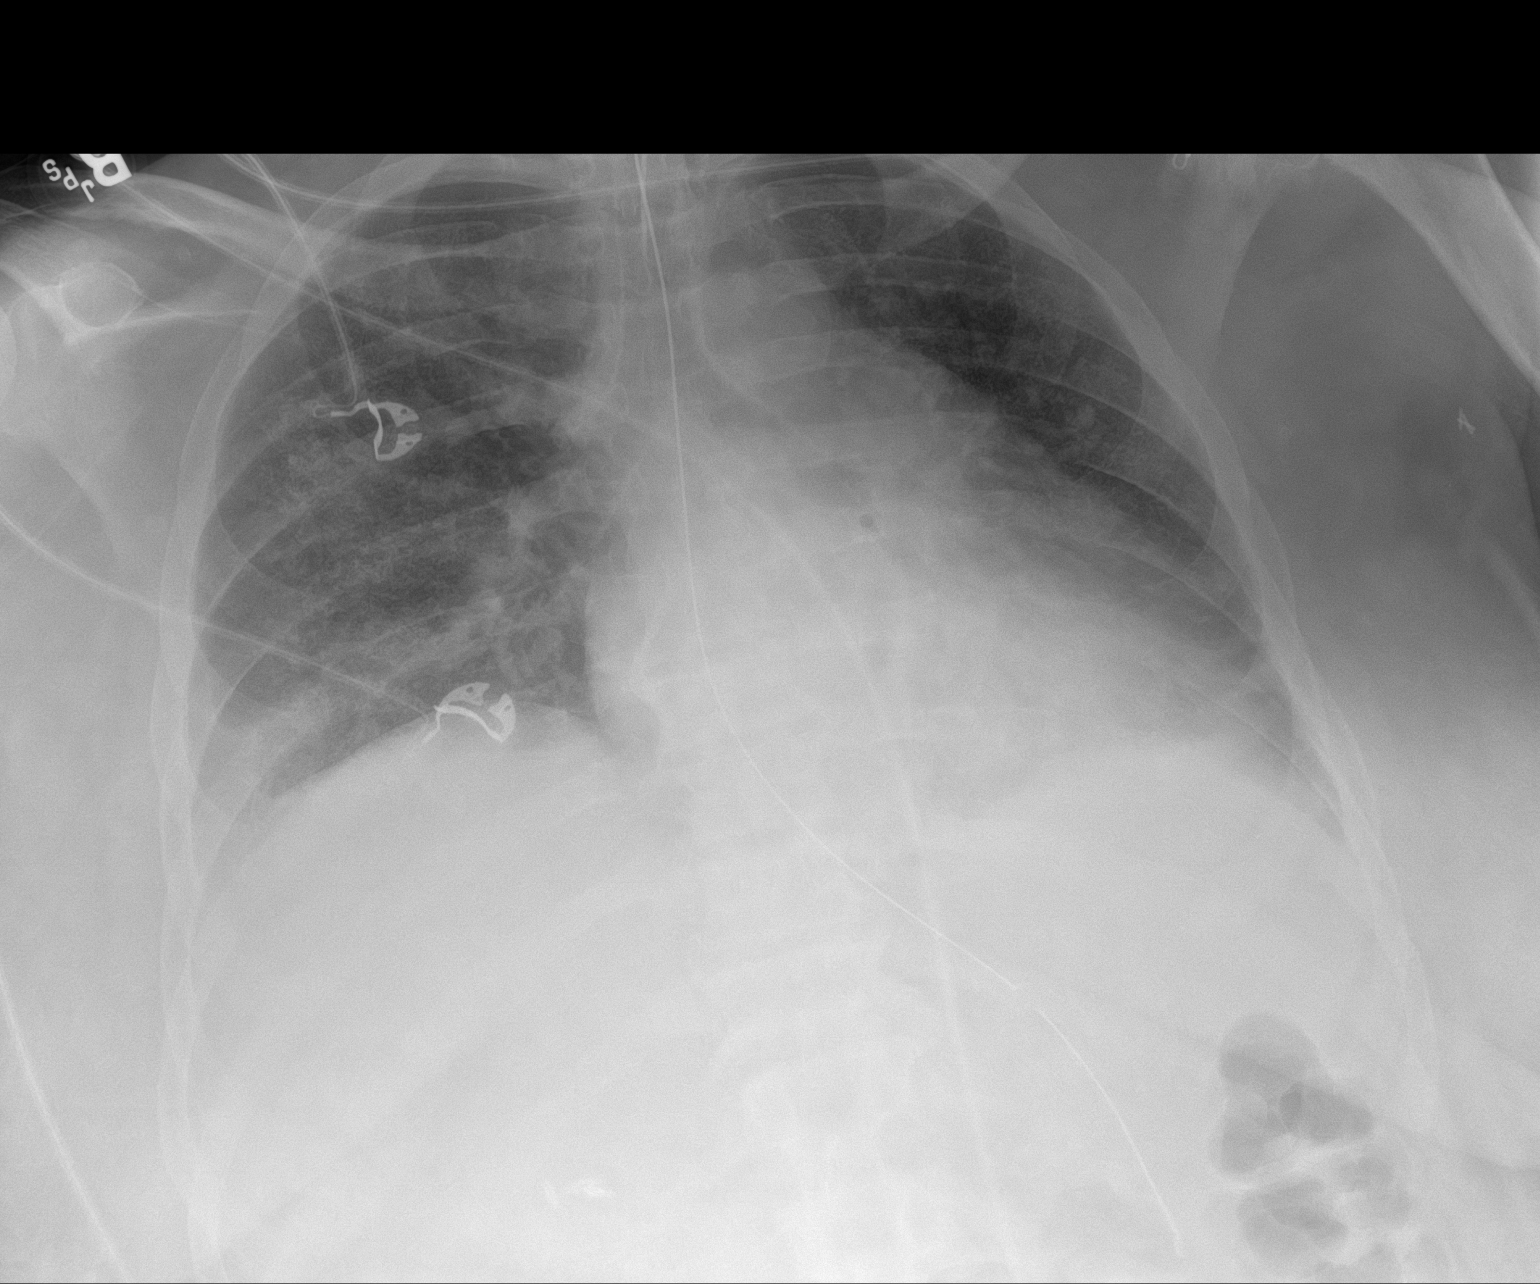

[1 of 1 positions shown; findings below may reference images not displayed]

FINDINGS: EKG leads project over the chest.

Endotracheal tube terminates 3.4 cm from the carina.

Gastric tube tip in the mid to distal stomach, side port below GE
junction.

Cardiomediastinal contours remain enlarged. Hilar structures remain
indistinct. Increased interstitial markings throughout the chest
remain evident with diminished alveolar opacity with ill-defined
characteristics seen throughout the chest.

Basilar airspace disease may be slightly increased with blunting of
LEFT costodiaphragmatic sulcus and RIGHT costodiaphragmatic sulcus.
No pneumothorax.

On limited assessment there is no acute skeletal finding.
IMPRESSION: 1. Continued bilateral interstitial and airspace disease with
diminished alveolar opacities. Findings favor slight improvement
with respect to pulmonary edema but with developing basilar airspace
disease and small effusions. Correlate with any signs of infection.
2. Cardiomegaly as before.

## 2021-08-26 MED ORDER — CLEVIDIPINE BUTYRATE 0.5 MG/ML IV EMUL
0.0000 mg/h | INTRAVENOUS | Status: DC
  Administered 2021-08-26 (×2): 14 mg/h via INTRAVENOUS
  Administered 2021-08-26: 21 mg/h via INTRAVENOUS
  Administered 2021-08-26: 12 mg/h via INTRAVENOUS
  Administered 2021-08-26: 21 mg/h via INTRAVENOUS
  Administered 2021-08-26: 18 mg/h via INTRAVENOUS
  Administered 2021-08-27: 21 mg/h via INTRAVENOUS
  Administered 2021-08-27: 15 mg/h via INTRAVENOUS
  Administered 2021-08-27: 21 mg/h via INTRAVENOUS
  Administered 2021-08-27: 19 mg/h via INTRAVENOUS
  Administered 2021-08-27: 21 mg/h via INTRAVENOUS
  Administered 2021-08-27: 15 mg/h via INTRAVENOUS
  Administered 2021-08-27 (×2): 21 mg/h via INTRAVENOUS
  Administered 2021-08-28 (×2): 11 mg/h via INTRAVENOUS
  Filled 2021-08-26 (×3): qty 100
  Filled 2021-08-26: qty 50
  Filled 2021-08-26: qty 100
  Filled 2021-08-26: qty 200
  Filled 2021-08-26: qty 700
  Filled 2021-08-26: qty 200
  Filled 2021-08-26: qty 50
  Filled 2021-08-26: qty 200
  Filled 2021-08-26: qty 100
  Filled 2021-08-26: qty 200
  Filled 2021-08-26: qty 100

## 2021-08-26 MED ORDER — HYDRALAZINE HCL 50 MG PO TABS
100.0000 mg | ORAL_TABLET | Freq: Three times a day (TID) | ORAL | Status: DC
  Administered 2021-08-27 – 2021-09-02 (×13): 100 mg
  Filled 2021-08-26 (×16): qty 2

## 2021-08-26 MED ORDER — POLYVINYL ALCOHOL 1.4 % OP SOLN: 1.0000 [drp] | OPHTHALMIC | Status: DC | PRN

## 2021-08-26 MED ORDER — ORAL CARE MOUTH RINSE
15.0000 mL | Freq: Two times a day (BID) | OROMUCOSAL | Status: DC
  Administered 2021-08-26 – 2021-08-27 (×3): 15 mL via OROMUCOSAL

## 2021-08-26 MED ORDER — SCOPOLAMINE 1 MG/3DAYS TD PT72
1.0000 | MEDICATED_PATCH | TRANSDERMAL | Status: DC
  Administered 2021-08-26: 1.5 mg via TRANSDERMAL
  Filled 2021-08-26: qty 1

## 2021-08-26 MED ORDER — CHLORHEXIDINE GLUCONATE CLOTH 2 % EX PADS
6.0000 | MEDICATED_PAD | Freq: Every day | CUTANEOUS | Status: DC
  Administered 2021-08-27: 6 via TOPICAL

## 2021-08-26 MED ORDER — CHLORHEXIDINE GLUCONATE CLOTH 2 % EX PADS: 6.0000 | MEDICATED_PAD | Freq: Every day | CUTANEOUS | Status: DC

## 2021-08-26 NOTE — Progress Notes (Signed)
PCCM Progress Note  Patient extubated a few moments ago and initially tolerated well but unfortunately quickly postextubation patient was seen with poor ineffective cough, copious secretions unable to clear independently, and poor gag reflex leading to concern for ability to protect airway.  However patient appears in no obvious distress and oxygen saturations are 100% on 4 L nasal cannula.    Discussed with family at bedside regarding concern for ongoing ability to protect airway and decision was made to reintubate if patient becomes distressed.  At this time we will continue to monitor closely for airway protection in ICU with added chest PT and NTS suctioning as needed.  Patient is to remain strict n.p.o. with aspiration precautions in place.  Reneisha Stilley D. Kenton Kingfisher, NP-C [facility] Personal contact information can be found on Amion  08/26/2021, 12:42 PM

## 2021-08-26 NOTE — Progress Notes (Signed)
Pt restless when precedex rate decreased as low as 0.6 mcg. Pt causing IV alarm on IV channel to continuously alarm. This RN increased dose to ensure pt was safe d/t med delay d/t IV alarming and pt having HD session at time. Will titrate precedex once off HD session.

## 2021-08-26 NOTE — Progress Notes (Signed)
Subjective: Patient reports alert with eyes open. Continues on the vent with light sedation. She follows commands on the right. Nods but to questions but does not appear to be appropriate. No acute events overnight.  Objective: Vital signs in last 24 hours: Temp:  [97.4 F (36.3 C)-99.3 F (37.4 C)] 97.4 F (36.3 C) (06/01 0400) Pulse Rate:  [64-80] 70 (06/01 0700) Resp:  [0-20] 16 (06/01 0700) BP: (97-157)/(45-99) 140/59 (06/01 0700) SpO2:  [92 %-100 %] 100 % (06/01 0700) Arterial Line BP: (134-163)/(57-73) 147/68 (06/01 0700) FiO2 (%):  [30 %] 30 % (06/01 0314)  Intake/Output from previous day: 05/31 0701 - 06/01 0700 In: 2798.5 [I.V.:2643.3; IV Piggyback:155.2] Out: 1198 [Drains:198] Intake/Output this shift: No intake/output data recorded.  Physical Exam: Patient intubated and sedated on Precedex. NAD. She is not alert but will open eyes to voice and follows simple commands on the right. No purposeful movement on the left. Pupils 50m NR.  GCS 9. EVD site is intact with good pulsatile flow.  Lab Results: Recent Labs    08/25/21 0551 08/26/21 0423  WBC 5.2 5.6  HGB 10.3* 11.0*  HCT 32.7* 34.7*  PLT 91* 96*   BMET Recent Labs    08/25/21 0551 08/25/21 0821 08/26/21 0215 08/26/21 0423  NA 147*   < > 156* 147*  K 4.2  --   --  3.5  CL 112*  --   --  111  CO2 25  --   --  27  GLUCOSE 145*  --   --  108*  BUN 45*  --   --  21*  CREATININE 7.01*  --   --  3.91*  CALCIUM 8.2*  --   --  8.4*   < > = values in this interval not displayed.    Studies/Results: MR BRAIN WO CONTRAST  Result Date: 08/25/2021 CLINICAL DATA:  Neuro deficit, acute, stroke suspected. Nontraumatic intracranial hemorrhage. Altered mental status. EXAM: MRI HEAD WITHOUT CONTRAST TECHNIQUE: Multiplanar, multiecho pulse sequences of the brain and surrounding structures were obtained without intravenous contrast. COMPARISON:  Multiple CT studies 08/23/2021.  MRI 04/02/2019 FINDINGS: Brain: Right  thalamic intraparenchymal hematoma as seen previously, proximally maximal dimension 3.6 cm. I do not believe the hematoma has increased in size. There is surrounding edema including extension into the right midbrain and pons. Small amount of intraventricular blood as seen previously. Right frontal ventriculostomy enters the frontal horn of the right lateral ventricle and crosses the midline towards the left. Ventricular size is stable. Numerous other punctate foci of hemosiderin deposition scattered throughout the brain consistent with old hypertensive microhemorrhages. There is a subcentimeter acute infarction affecting the cingulate gyrus on the right. Vascular: Major vessels at the base of the brain show flow. Skull and upper cervical spine: Otherwise negative Sinuses/Orbits: Clear/normal Other: None IMPRESSION: Comparing techniques, no change in size of the right thalamic intraparenchymal hemorrhage is suspected, maximal dimension 3.6 cm by MRI. Surrounding edema, likely slightly more prominent than on the most recent CT. Right ventriculostomy remains in place without evidence of developing hydrocephalus. Small amount of intraventricular blood appears similar. Subcentimeter acute infarction of the right cingulate gyrus. Old small vessel insults with hemosiderin deposition consistent with hypertensive microangiopathy. Electronically Signed   By: MNelson ChimesM.D.   On: 08/25/2021 15:35   EEG adult  Result Date: 08/24/2021 YLora Havens MD     08/24/2021 10:24 AM Patient Name: Janet Mitchell MRN: [id number] Attending: PLora HavensReferring Physician/Provider: AAmie Portland  MD Date: 08/24/2021 Duration: 23.47 mins Patient history: 89 old female with nontraumatic intracerebral hemorrhage affecting the left side and altered mental status.  EEG evaluate for seizure. Level of alertness: Awake, asleep AEDs during EEG study: None Technical aspects: This EEG study was done with scalp electrodes  positioned according to the 10-20 International system of electrode placement. Electrical activity was acquired at a sampling rate of '500Hz'$  and reviewed with a high frequency filter of '70Hz'$  and a low frequency filter of '1Hz'$ . EEG data were recorded continuously and digitally stored. Description: The posterior dominant rhythm consists of 8 Hz activity of moderate voltage (25-35 uV) seen predominantly in posterior head regions, symmetric and reactive to eye opening and eye closing. Sleep was characterized by vertex waves, sleep spindles (12 to 14 Hz), maximal frontocentral region. EEG showed intermittent generalized 3 to 6 Hz theta-delta slowing. Hyperventilation and photic stimulation were not performed.   ABNORMALITY - Intermittent slow, generalized IMPRESSION: This study is suggestive of mild diffuse encephalopathy, nonspecific etiology. No seizures or epileptiform discharges were seen throughout the recording. Priyanka O Yadav   [place] EKG SITE RITE  Result Date: 08/24/2021 If [facility] image not attached, placement could not be confirmed due to current cardiac rhythm.   Assessment/Plan: 56 year old female who presented to the ED with nausea, vomiting, and left-sided hemiparesis who was found to have a large thalamic bleed with intraventricular extension. While in the emergency department, her neurological status declined and required emergent intubation for airway protection.  Follow-up CT scan today revealed progression of the hemorrhage with extension into upper midbrain with development of hydrocephalus. EVD placed on 08/23/2021. Follow up CT head after EVD placement revealed stable right thalamic bleed with a slight decrease in the lateral and third ventricles. The day following EVD placement, she began to follow commands on the right. Left hemiplegia persists. This morning her neuro exam is stable to slightly improved. EVD continues to have good pulsatile flow. MRI brain reviewed and revealed  Stable  appearance of the right thalamic hemorrhage. Surrounding edema appears slightly progressed. Right ventriculostomy in appropriate postion without signs of worsening hydrocephalus. Continue supportive care.  -Continue EVD at 10 cm H20      LOS: 3 days    Marvis Moeller, DNP, AGNP-C Neurosurgery Nurse Practitioner  [facility] [address], [place], [place] [address] P: [phone]    F: [phone]  08/26/2021 8:11 AM

## 2021-08-26 NOTE — Progress Notes (Signed)
Pt on HD session a good portion of the night, therefore pt's nightly bath and chg not completed d/t contraindication (i.e. HD needles in place). Pt having AV fistula, also causing delay in this particular hygiene care b/c site needing to be held until hemostasis takes place. Will communicate this to the oncoming RN.

## 2021-08-26 NOTE — Progress Notes (Signed)
OT Cancellation Note  Patient Details Name: Janet Mitchell MRN: [id number] DOB: 06-12-1965   Cancelled Treatment:    Reason Eval/Treat Not Completed: Medical issues which prohibited therapy. Discussed with nsg. Possible extubation today; EVD being assessed for possible clot. Will check back later this pm.   Yezenia Fredrick,HILLARY 08/26/2021, 8:18 AM Maurie Boettcher, OT/L   Acute OT Clinical Specialist Acute Rehabilitation Services Pager [phone] Office [phone]

## 2021-08-26 NOTE — Progress Notes (Signed)
[facility] Progress Note  Subjective: pt seen in ICU.  She is extubated now  Vitals:   08/26/21 1130 08/26/21 1145 08/26/21 1200 08/26/21 1228  BP: (!) 130/59 (!) 107/53  (!) 148/68  Pulse: 73 72  75  Resp: '16 16  16  '$ Temp:   98.1 F (36.7 C)   TempSrc:   Axillary   SpO2: 100% 100%  100%  Weight:      Height:        Exam:  alert, not responding to questions  no jvd  Chest cta bilat  Cor reg no RG  Abd soft ntnd no ascites   Ext no LE edema   Alert, NF, ox3   AVF+ bruit       Home meds include - asa, tylenol, renavite, cadexomer iodine, phoslo , ceterizine, cholestyramine, hydralazine 10 bid, labetalol 200 bid, losartan 100, psyllium        OP HD: [facility] MWF  4h  97.5kg  450/500  2/2.5 bath  15ga  AVF  Hep none - mircera 100 ug q2 - rocaltrol 1.0 mcg po tiw     Assessment/ Plan: R thalamus ICH - w/ IVH and hydrocephalus. Drain placed per neurosurgery 5/29. SP 3% saline, dc'd now.  VDRF - extubated now. CXR today shows sig improvement from last film.  HTN'sive emergency - uncontrolled HTN, better. Getting home labetalol/ hydralazine/ losartan per tube. Also norvasc 10 qd was added. Also back on cleviprex gtt.  Vol - no vol excess on exam, but wt's are up 6-7kg. UF 2-3 L w/ next HD.  ESRD - on HD MWF. Had HD here Tues and Wed. Next HD Friday.  Atrial fib - on BB, a/c's on hold Anemia esrd - Hb 11- 13, no esa needs MBD ckd - CCa and phos in range. No need for binder for now, cont po vdra per tube.  Nutrition - cont renavite per NG. If tube feeds needed we should use low K+ preparation (I/e., Nepro).        Janet Mitchell 08/25/2021, 9:04 AM   Recent Labs  Lab 08/23/21 [phone] 08/23/21 0640 08/25/21 0551 08/26/21 0423  HGB 10.9*   < > 10.3* 11.0*  ALBUMIN 3.8  --   --   --   CALCIUM 8.5*   < > 8.2* 8.4*  PHOS  --    < > 5.8* 3.1  CREATININE 9.25*   < > 7.01* 3.91*  K 5.2*   < > 4.2 3.5   < > = values in this interval not displayed.     Inpatient medications:  amLODipine  10 mg Per Tube Daily   calcitRIOL  1 mcg Per Tube Q M,W,F   Chlorhexidine Gluconate Cloth  6 each Topical Q0600   hydrALAZINE  100 mg Per Tube Q8H   insulin aspart  0-6 Units Subcutaneous Q4H   labetalol  200 mg Per Tube TID   losartan  100 mg Per Tube Daily   mouth rinse  15 mL Mouth Rinse BID   multivitamin  1 tablet Per Tube QHS   pantoprazole sodium  40 mg Per Tube Daily   senna-docusate  1 tablet Per Tube BID    clevidipine 14 mg/hr (08/26/21 1403)   dexmedetomidine (PRECEDEX) IV infusion Stopped (08/26/21 1156)   piperacillin-tazobactam (ZOSYN)  IV 100 mL/hr at 08/26/21 1200   acetaminophen **OR** acetaminophen (TYLENOL) oral liquid 160 mg/5 mL **OR** acetaminophen, diphenhydrAMINE, ipratropium-albuterol, ondansetron (ZOFRAN) IV, polyvinyl alcohol

## 2021-08-26 NOTE — Progress Notes (Signed)
RN noticed during oral care that pt tongue is markedly more swollen than on previous assessments. SpO2 99% on 4L [place], no acute distress noted. Whitney CCM NP notified due to concern of airway clearance overnight. RN to continue to monitor respiratory status and Loree Fee will come round on pt.

## 2021-08-26 NOTE — Progress Notes (Signed)
Pt's sodium lab 156, goal 150-155. K. Meyran, NP informed and stated to keep 3% rate the same.

## 2021-08-26 NOTE — Progress Notes (Addendum)
NAME:  Janet Mitchell, MRN:  [id number], DOB:  Jul 29, 1965, LOS: 3 ADMISSION DATE:  08/23/2021, CONSULTATION DATE:  08/23/2021 REFERRING MD:  Dr. Rory Percy - Neuro CHIEF COMPLAINT: Code Stroke  History of Present Illness:  56 year old woman who presented to [facility] 5/29 with reported left-sided weakness, slurred speech, nausea/vomiting. LKN 6 hours prior to arrival. PMHx significant for HTN, CHF (Echo 07/2021 EF 60-65%, severe LVH, G2DD), T2DM (c/b diabetic neuropathy with subsequent L great toe amputation 2/2 ulcer/infection), ESRD (on HD MWF via [facility]).  On arrival to ED, patient is somnolent with slight disconjugate gaze with near flaccid left upper arm and left leg weakness. BP 222/107. CT Head with 5cc acute hematoma in the right thalamus with intraventricular extension. Neurology consulted and patient transferred to [facility] for further evaluation. PCCM consulted for admission.  On arrival to unit patient is intubated, pupils 53m bilaterally and non-reactive. Withdrawals in all extremities, does not follow commands, on Propofol and Cleviprex gtt.    Pertinent Medical History:  ESRD with HD MWF, CHF, HTN, DM, s/p left great toe amputation secondary to ulcer/infection   Significant Hospital Events: Including procedures, antibiotic start and stop dates in addition to other pertinent events   [facility] HD, more awake, following commands with RUE/RLE 6/1 No issues overnight, she remains alert and interactive on vent  Interim History / Subjective:  She is able to follow commands on right side  SBT this am pending   Objective:  Blood pressure (!) 140/59, pulse 70, temperature (!) 97.4 F (36.3 C), temperature source Axillary, resp. rate 16, height '5\' 7"'$  (1.702 m), weight 104.4 kg, SpO2 100 %.    Vent Mode: PRVC FiO2 (%):  [30 %] 30 % Set Rate:  [16 bmp] 16 bmp Vt Set:  [490 mL] 490 mL PEEP:  [5 cmH20] 5 cmH20 Pressure Support:   [5 cmH20] 5 cmH20 Plateau Pressure:  [17 cmH20-18 cmH20] 17 cmH20   Intake/Output Summary (Last 24 hours) at 08/26/2021 [id number] data filed at 08/26/2021 0700 Gross per 24 hour  Intake 2672.54 ml  Output 1189 ml  Net 1483.54 ml    Filed Weights   08/23/21 0728 08/24/21 0600  Weight: 104.5 kg 104.4 kg   Physical Examination: General: Acute on chronically ill appearing middle aged female on mechanical ventilation, in NAD HEENT: ETT, MM pink/moist, PERRL,  Neuro: Alert and interactive on vent, left side weakness, 5/5 right side  CV: s1s2 regular rate and rhythm, no murmur, rubs, or gallops,  PULM:  Clear to ascultation, no increased work of breathing, no added breath sounds  GI: soft, bowel sounds active in all 4 quadrants, non-tender, non-distended, tolerating TF Extremities: warm/dry, no edema  Skin: no rashes or lesions  Resolved Hospital Problem List:   Slight Hyperkalemia, K 6.0  Assessment & Plan:   Right Thalamus ICH with IVH and Obstructive Hydrocephalus s/p EVD -Repeat MRI brain 5/31 No change in size of the right thalamic intraparenchymal hemorrhage is suspected, maximal dimension 3.6 cm by MRI.  P: Management per neurology/NSGY Maintain neuro protective measures; goal for eurothermia, euglycemia, eunatermia, normoxia, and PCO2 goal of 35-40 Nutrition and bowel regiment  Seizure precautions  Aspirations precautions  Frequent neuro checks   Respiratory Insufficiency in setting of above  Concern for aspiration event  -CXR with pulmonary edema vs. Consolidation. P: Continue ventilator support with lung protective strategies  SBT this am with high likelihood of extubation  Wean PEEP and FiO2 for sats greater than 90%. Head of bed elevated 30 degrees. Plateau pressures less than 30 cm H20.  Follow intermittent chest x-ray and ABG.   Ensure adequate pulmonary hygiene  Follow cultures  VAP bundle in place  PAD protocol  Hypernatremia Cerebral  edema P: Continued HTS per neuro  Continue to trend Bmets  Neuro exam remains improved   HTN Emergency  - Goal SBP < 160 per Stroke P: Hemodynamics remains well controlled on PO Norvasc, Hydralazine, Labetalol, Losartan Continuous telemetry   ESRD on HD MWF  P: Nephrology following  iHD per nephrology  Follow renal function  Monitor urine output Trend Bmet Avoid nephrotoxins Ensure adequate renal perfusion   DM P: Continue SSI  CBG goal 140-180 CBG checks q4hrs   Foot Ulcer, +Pseudomonas on recent culture  -Was recently prescribed Cipro, will change to Zosyn for now to also cover for possible aspiration event P: Remains on empiric Zosyn started 5/29 Follow cultures   Best Practice: (right click and "Reselect all SmartList Selections" daily)   Diet/type: NPO DVT prophylaxis: SCD GI prophylaxis: PPI Lines: N/A Foley:  N/A Code Status:  full code Last date of multidisciplinary goals of care discussion [5/29]  Critical care time: 38 minutes   Dorothy Polhemus D. Kenton Kingfisher, NP-C [facility] Personal contact information can be found on Amion  08/26/2021, 8:42 AM

## 2021-08-26 NOTE — Progress Notes (Signed)
PT Cancellation Note  Patient Details Name: Janet Mitchell MRN: [id number] DOB: [birth year]   Cancelled Treatment:    Reason Eval/Treat Not Completed: Medical issues which prohibited therapy; will hold PT this am due to potential extubation later and needing to get EVD draining.  Will attempt later as pt able and schedule permits.    Reginia Naas 08/26/2021, 8:58 AM Magda Kiel, PT Acute Rehabilitation Services WIOMB:[phone] Office:[phone] 08/26/2021

## 2021-08-26 NOTE — Progress Notes (Addendum)
STROKE TEAM PROGRESS NOTE   INTERVAL HISTORY No family at the bedside. Pt still intubated but eyes open, following simple commands on the right. Still has left gaze. On 3% saline with Na up to 150, plan to d/c today.Hopefully extubate today as well. Still on cleviprex. Per nsgy plan to start weaning EVD Sunday or Monday.   Vitals:   08/26/21 0838 08/26/21 0845 08/26/21 0900 08/26/21 0914  BP: (!) 140/58 (!) 125/59 129/64 (!) 138/55  Pulse: 74 74 72   Resp: '17 19 16   '$ Temp:      TempSrc:      SpO2: 100% 100% 100%   Weight:      Height:       CBC:  Recent Labs  Lab 08/23/21 0632 08/23/21 0640 08/25/21 0551 08/26/21 0423  WBC 4.0   < > 5.2 5.6  NEUTROABS 2.2  --   --   --   HGB 10.9*   < > 10.3* 11.0*  HCT 35.8*   < > 32.7* 34.7*  MCV 81.0   < > 79.6* 79.8*  PLT 90*   < > 91* 96*   < > = values in this interval not displayed.    Basic Metabolic Panel:  Recent Labs  Lab 08/25/21 0551 08/25/21 0821 08/26/21 0423 08/26/21 0819  NA 147*   < > 147* 150*  K 4.2  --  3.5  --   CL 112*  --  111  --   CO2 25  --  27  --   GLUCOSE 145*  --  108*  --   BUN 45*  --  21*  --   CREATININE 7.01*  --  3.91*  --   CALCIUM 8.2*  --  8.4*  --   MG 1.9  --  1.9  --   PHOS 5.8*  --  3.1  --    < > = values in this interval not displayed.    Lipid Panel:  Recent Labs  Lab 08/23/21 1020 08/24/21 1329 08/25/21 0821  CHOL 178  --   --   TRIG 383*   < > 176*  HDL 63  --   --   CHOLHDL 2.8  --   --   VLDL 77*  --   --   LDLCALC 38  --   --    < > = values in this interval not displayed.    HgbA1c:  Recent Labs  Lab 08/23/21 1020  HGBA1C 6.2*    Urine Drug Screen: No results for input(s): LABOPIA, COCAINSCRNUR, LABBENZ, AMPHETMU, THCU, LABBARB in the last 168 hours.  Alcohol Level  Recent Labs  Lab 08/23/21 0632  ETH <10     IMAGING past 24 hours MR BRAIN WO CONTRAST  Result Date: 08/25/2021 CLINICAL DATA:  Neuro deficit, acute, stroke suspected. Nontraumatic  intracranial hemorrhage. Altered mental status. EXAM: MRI HEAD WITHOUT CONTRAST TECHNIQUE: Multiplanar, multiecho pulse sequences of the brain and surrounding structures were obtained without intravenous contrast. COMPARISON:  Multiple CT studies 08/23/2021.  MRI 04/02/2019 FINDINGS: Brain: Right thalamic intraparenchymal hematoma as seen previously, proximally maximal dimension 3.6 cm. I do not believe the hematoma has increased in size. There is surrounding edema including extension into the right midbrain and pons. Small amount of intraventricular blood as seen previously. Right frontal ventriculostomy enters the frontal horn of the right lateral ventricle and crosses the midline towards the left. Ventricular size is stable. Numerous other punctate foci of hemosiderin deposition scattered throughout  the brain consistent with old hypertensive microhemorrhages. There is a subcentimeter acute infarction affecting the cingulate gyrus on the right. Vascular: Major vessels at the base of the brain show flow. Skull and upper cervical spine: Otherwise negative Sinuses/Orbits: Clear/normal Other: None IMPRESSION: Comparing techniques, no change in size of the right thalamic intraparenchymal hemorrhage is suspected, maximal dimension 3.6 cm by MRI. Surrounding edema, likely slightly more prominent than on the most recent CT. Right ventriculostomy remains in place without evidence of developing hydrocephalus. Small amount of intraventricular blood appears similar. Subcentimeter acute infarction of the right cingulate gyrus. Old small vessel insults with hemosiderin deposition consistent with hypertensive microangiopathy. Electronically Signed   By: Nelson Chimes M.D.   On: 08/25/2021 15:35    PHYSICAL EXAM  Physical Exam  Constitutional: Appears well-developed and well-nourished.  Cardiovascular: Normal rate and regular rhythm.  Respiratory: Effort normal, non-labored breathing, on vent with weaning  Neuro -  intubated on precedex, eyes open with mild left ptosis, able to follow simple commands. With eye opening, eyes in right gaze preference position, barely cross midline, blinking to visual threat on the right but not on the left, PERRL. Corneal reflex present, gag and cough present. Breathing over the vent.  Facial symmetry not able to test due to ET tube.  Tongue protrusion not cooperative.  Right side upper and lower extremity spontaneous movement, left upper and lower extremity hemiplegia with mild withdraw on the LLE with pain. Sensation, coordination not cooperative and gait not tested.   ASSESSMENT/PLAN Janet Mitchell is a 56 y.o. female with history of ESRD on HD MWF, CHF, HTN, DM presenting with nausea, vomiting, left side weakness present upon waking. Intubated emergently at [facility] and transferred to [facility] on 5/59.  NSGY consulted, EVD placement 5/29. MRI shows stable size in right thalamic hemorrhage with edema extending to the pons and midbrain. EVD still in place. Plan to extubate today per CCM. D/c hypertonic saline.  ICH:  Right thalamic ICH with IVH and mild hydrocephalus s/p EVD, likely secondary to uncontrolled hypertension Code Stroke CT head Right thalamic IVH previously 2.9 x 1.7 x 3.1 cm 5/29 Head CT 1000- Interval significant increase in size of a an acute hematoma in the right thalamus, now measuring 3.6 x 2.5 x 3.8 cm. Small volume intraventricular extension of hemorrhage. Mild hydrocephalus. 5/29 Head CT 2200- decrease in size of lateral and third ventricles s/p EVD placement  CTA head & neck No arterial lesion underlying the right thalamic hematoma. Equivocal for spot sign which would be an adverse finding suggesting continued growth MRI  no change in size of the right thalamic intraparenchymal hemorrhage, maximal dimension 3.6 cm by MRI. Surrounding edema, likely slightly more prominent. 2D Echo EF 60-65%, consistent with grade II diastolic dysfunction EEG - mild  diffuse encephalopathy LDL 38 HgbA1c 6.2 VTE prophylaxis - SCDs aspirin 81 mg daily prior to admission, now on No antithrombotic.  Therapy recommendations:  Pending Disposition:  Pending  Obstructive Hydrocephalus NSGY on board 5/29- EVD placement  Follow up CT shows slightly decrease in size of lateral and third ventricle  Plan for increase level and attempt clamping over the weekend.   Cerebral edema On 3% saline @ 75- d/c today Given ESRD, try to avoid fluid overload, will d/c 3% saline Na goal 150-155 Na 142->146->141->147 -> 150  Acute Hypoxic Respiratory failure Intubated 5/29 CCM managing vent Plan for extubation today  Hypertensive emergency Home meds:  labetolol, hydralazine, losartan Stable BP less than 160  On losartan 100, labetalol 200 3 times daily, hydralazine 50 3 times daily Add amlodipine 10 Long-term BP goal normotensive Still on cleviprex, taper off as able  End Stage Renal Failure on hemodialysis MWF Nephrology consulted Dialysis done 5/30 Monitor BMP/electrolytes  Diabetes Mellitus A1c 6.2, controlled CBGs SSI Close PCP follow-up  Other Stroke Risk Factors Obesity, Body mass index is 36.05 kg/m., BMI >/= 30 associated with increased stroke risk, recommend weight loss, diet and exercise as appropriate  Congestive heart failure  Other Active Problems Hyperkalemia  K 6.1->4.2-> 3.5, s/p HD and [facility] day # 3  Patient seen and examined by NP/APP with MD. MD to update note as needed.   Janet Ores, DNP, FNP-BC [facility] Pager: [phone]  ATTENDING NOTE: I reviewed above note and agree with the assessment and plan. Pt was seen and examined.   Daughter at bedside.  Patient still intubated, however eyes open, follows simple commands on the right.  MRI showed right thalamic ICH with extension to right midbrain.  Stable from prior CT.  EVD drainage patent, discussed with Dr. Marcello Moores neurosurgery, attempt to clamp  over the weekend.  Discussed with CCM, plan for extubation today.  Sodium 147, patient clinically stable, would not chase sodium level given ESRD on hemodialysis, will DC 3% saline today. I had long discussion with daughter at bedside, updated pt current condition, treatment plan and potential prognosis, and answered all the questions.  She expressed understanding and appreciation.   For detailed assessment and plan, please refer to above as I have made changes wherever appropriate.   [name], MD PhD Stroke Neurology 08/26/2021 7:27 PM  This patient is critically ill due to right thalamic ICH, IVH, obstructive hydrocephalus, hypertensive emergency, cerebral edema and at significant risk of neurological worsening, death form brain herniation, obstructive hydrocephalus, renal failure, respiratory failure. This patient's care requires constant monitoring of vital signs, hemodynamics, respiratory and cardiac monitoring, review of multiple databases, neurological assessment, discussion with family, other specialists and medical decision making of high complexity. I spent 35 minutes of neurocritical care time in the care of this patient.     To contact Stroke Continuity provider, please refer to [email or web address] After hours, contact General Neurology

## 2021-08-26 NOTE — Progress Notes (Signed)
[facility] Progress Note  Subjective: pt responsive. Pt was cramping earlier during HD, so UF was turned off.   Vitals:   08/26/21 0815 08/26/21 0830 08/26/21 0838 08/26/21 0845  BP: 135/64 112/79 (!) 140/58 (!) 125/59  Pulse: 76 75 74 74  Resp: _0 Temp:      TempSrc:      SpO2: 99% 100% 100% 100%  Weight:      Height:        Exam: Gen on vent, sedated Sclera anicteric, throat w/ ETT No jvd or bruits Chest clear anterior/ lateral, no rales/ rhonchi RRR no RG Abd soft ntnd no mass or ascites +bs Ext no LE edema Neuro is on vent, sedated      Home meds include - asa, tylenol, renavite, cadexomer iodine, phoslo , ceterizine, cholestyramine, hydralazine 10 bid, labetalol 200 bid, losartan 100, psyllium        OP HD: [facility] MWF  4h  97.5kg  450/500  2/2.5 bath  15ga  AVF  Hep none - mircera 100 ug q2 - rocaltrol 1.0 mcg po tiw     Assessment/ Plan: ICH - w L sided symptoms. CTH showed IC bleed w/ IVH and hydrocephalus. Drain placed per neurosurgery 5/29. Getting 3% saline and q 6h sodium levels.  HTN'sive emergency - uncontrolled HTN, on Cleviprex gtt for BP control.  Vol - no gross vol excess on exam, not sure if wt's are accurate. UF 1L w/ hd ESRD - on HD MWF. Had HD here Tuesday. HD again today to get back on schedule.  Atrial fib - on BB, a/c's on hold Anemia esrd - Hb 11- 13, no esa needs MBD ckd - CCa and phos in range. No need for binder for now, cont po vdra per tube.  Nutrition - cont renavite per NG. If tube feeds needed we should use low K+ preparation (I/e., Nepro).        Rob Henny Strauch 08/25/2021, 9:04 AM   Recent Labs  Lab 08/23/21 [phone] 08/23/21 0640 08/25/21 0551 08/26/21 0423  HGB 10.9*   < > 10.3* 11.0*  ALBUMIN 3.8  --   --   --   CALCIUM 8.5*   < > 8.2* 8.4*  PHOS  --    < > 5.8* 3.1  CREATININE 9.25*   < > 7.01* 3.91*  K 5.2*   < > 4.2 3.5   < > = values in this interval not displayed.    Inpatient  medications:  amLODipine  10 mg Per Tube Daily   calcitRIOL  1 mcg Per Tube Q M,W,F   chlorhexidine gluconate (MEDLINE KIT)  15 mL Mouth Rinse BID   Chlorhexidine Gluconate Cloth  6 each Topical Q0600   hydrALAZINE  100 mg Per Tube Q8H   insulin aspart  0-6 Units Subcutaneous Q4H   labetalol  200 mg Per Tube TID   losartan  100 mg Per Tube Daily   mouth rinse  15 mL Mouth Rinse 10 times per day   multivitamin  1 tablet Per Tube QHS   pantoprazole sodium  40 mg Per Tube Daily   senna-docusate  1 tablet Per Tube BID    clevidipine     dexmedetomidine (PRECEDEX) IV infusion 0.6 mcg/kg/hr (08/26/21 0700)   piperacillin-tazobactam (ZOSYN)  IV Stopped (08/26/21 0444)   sodium chloride (hypertonic) 75 mL/hr at 08/26/21 0800   acetaminophen **OR** acetaminophen (TYLENOL) oral liquid 160 mg/5 mL **OR** acetaminophen, diphenhydrAMINE, fentaNYL (SUBLIMAZE)  injection, ipratropium-albuterol, ondansetron (ZOFRAN) IV

## 2021-08-26 NOTE — Procedures (Signed)
Extubation Procedure Note  Patient Details:   Name: Janet Mitchell DOB: 1965-04-18 MRN: [id number]   Airway Documentation:    Vent end date: 08/26/21 Vent end time: 1228   Evaluation  O2 sats: stable throughout Complications: No apparent complications Patient did tolerate procedure well. Bilateral Breath Sounds: Diminished   No   Patient was extubated to a 4L . A copious amount of oral secretions were obtained. Patient has a productive cough but is unable to clear secretions effectively & does not have a gag reflex at this time. CCM NP came to the bedside & is aware. Positive cuff leak prior to extubation.   Claretta Fraise 08/26/2021, 12:28 PM

## 2021-08-26 NOTE — Progress Notes (Signed)
[facility] Progress Note  Subjective: pt seen in ICU.  She had HD overnight w/ 1 L off. Na+ was up to 156, down to 147 today. We used the highest Na+ available w/ HD last night which was Na+ 145  Vitals:   08/26/21 0830 08/26/21 0838 08/26/21 0845 08/26/21 0900  BP: 112/79 (!) 140/58 (!) 125/59 129/64  Pulse: 75 74 74 72  Resp: _0 Temp:      TempSrc:      SpO2: 100% 100% 100% 100%  Weight:      Height:        Exam: Gen on vent, sedated Sclera anicteric, throat w/ ETT No jvd or bruits Chest clear anterior/ lateral, no rales/ rhonchi RRR no RG Abd soft ntnd no mass or ascites +bs Ext no LE edema Neuro is on vent, sedated      Home meds include - asa, tylenol, renavite, cadexomer iodine, phoslo , ceterizine, cholestyramine, hydralazine 10 bid, labetalol 200 bid, losartan 100, psyllium        OP HD: [facility] MWF  4h  97.5kg  450/500  2/2.5 bath  15ga  AVF  Hep none - mircera 100 ug q2 - rocaltrol 1.0 mcg po tiw     Assessment/ Plan: R thalamus ICH - w/ IVH and hydrocephalus. Drain placed per neurosurgery 5/29. Getting 3% saline and q 6h sodium levels. We are dialyzing w/ the highest Na+ dialysate level which is 145.  HTN'sive emergency - uncontrolled HTN, better. Cleviprex dc'd this am. Getting home labetalol/ hydralazine/ losartan per tube. Also norvasc 10 qd was added.  Vol - no vol excess on exam, not sure wt's are accurate. UF 2-3 L w/ next HD.  VDRF - xray 5/29 w/ diffuse bilat infiltrates, edema vs other. Will repeat CXR.  ESRD - on HD MWF. Had HD here Tues and Wed. Next HD Friday.  Atrial fib - on BB, a/c's on hold Anemia esrd - Hb 11- 13, no esa needs MBD ckd - CCa and phos in range. No need for binder for now, cont po vdra per tube.  Nutrition - cont renavite per NG. If tube feeds needed we should use low K+ preparation (I/e., Nepro).        Rob Amias Hutchinson 08/25/2021, 9:04 AM   Recent Labs  Lab 08/23/21 [phone] 08/23/21 0640  08/25/21 0551 08/26/21 0423  HGB 10.9*   < > 10.3* 11.0*  ALBUMIN 3.8  --   --   --   CALCIUM 8.5*   < > 8.2* 8.4*  PHOS  --    < > 5.8* 3.1  CREATININE 9.25*   < > 7.01* 3.91*  K 5.2*   < > 4.2 3.5   < > = values in this interval not displayed.    Inpatient medications:  amLODipine  10 mg Per Tube Daily   calcitRIOL  1 mcg Per Tube Q M,W,F   chlorhexidine gluconate (MEDLINE KIT)  15 mL Mouth Rinse BID   Chlorhexidine Gluconate Cloth  6 each Topical Q0600   hydrALAZINE  100 mg Per Tube Q8H   insulin aspart  0-6 Units Subcutaneous Q4H   labetalol  200 mg Per Tube TID   losartan  100 mg Per Tube Daily   mouth rinse  15 mL Mouth Rinse 10 times per day   multivitamin  1 tablet Per Tube QHS   pantoprazole sodium  40 mg Per Tube Daily   senna-docusate  1 tablet Per  Tube BID    clevidipine     dexmedetomidine (PRECEDEX) IV infusion 0.6 mcg/kg/hr (08/26/21 0700)   piperacillin-tazobactam (ZOSYN)  IV Stopped (08/26/21 0444)   sodium chloride (hypertonic) 75 mL/hr at 08/26/21 0800   acetaminophen **OR** acetaminophen (TYLENOL) oral liquid 160 mg/5 mL **OR** acetaminophen, diphenhydrAMINE, fentaNYL (SUBLIMAZE) injection, ipratropium-albuterol, ondansetron (ZOFRAN) IV

## 2021-08-27 ENCOUNTER — Telehealth: Payer: Self-pay | Admitting: Urology

## 2021-08-27 ENCOUNTER — Inpatient Hospital Stay (HOSPITAL_COMMUNITY): Payer: 59

## 2021-08-27 DIAGNOSIS — L97529 Non-pressure chronic ulcer of other part of left foot with unspecified severity: Secondary | ICD-10-CM

## 2021-08-27 DIAGNOSIS — E11621 Type 2 diabetes mellitus with foot ulcer: Secondary | ICD-10-CM | POA: Diagnosis not present

## 2021-08-27 DIAGNOSIS — I161 Hypertensive emergency: Secondary | ICD-10-CM | POA: Diagnosis not present

## 2021-08-27 DIAGNOSIS — J9602 Acute respiratory failure with hypercapnia: Secondary | ICD-10-CM | POA: Diagnosis not present

## 2021-08-27 DIAGNOSIS — G911 Obstructive hydrocephalus: Secondary | ICD-10-CM | POA: Diagnosis not present

## 2021-08-27 DIAGNOSIS — J9601 Acute respiratory failure with hypoxia: Secondary | ICD-10-CM | POA: Diagnosis not present

## 2021-08-27 DIAGNOSIS — I610 Nontraumatic intracerebral hemorrhage in hemisphere, subcortical: Secondary | ICD-10-CM | POA: Diagnosis not present

## 2021-08-27 LAB — POCT I-STAT 7, (LYTES, BLD GAS, ICA,H+H)
Acid-Base Excess: 1 mmol/L (ref 0.0–2.0)
Acid-Base Excess: 3 mmol/L — ABNORMAL HIGH (ref 0.0–2.0)
Bicarbonate: 26.3 mmol/L (ref 20.0–28.0)
Bicarbonate: 27.2 mmol/L (ref 20.0–28.0)
Calcium, Ion: 1.1 mmol/L — ABNORMAL LOW (ref 1.15–1.40)
Calcium, Ion: 1.07 mmol/L — ABNORMAL LOW (ref 1.15–1.40)
HCT: 33 % — ABNORMAL LOW (ref 36.0–46.0)
HCT: 32 % — ABNORMAL LOW (ref 36.0–46.0)
Hemoglobin: 11.2 g/dL — ABNORMAL LOW (ref 12.0–15.0)
Hemoglobin: 10.9 g/dL — ABNORMAL LOW (ref 12.0–15.0)
O2 Saturation: 90 %
O2 Saturation: 99 %
Patient temperature: 99.6
Patient temperature: 99.1
Potassium: 3.8 mmol/L (ref 3.5–5.1)
Potassium: 3.7 mmol/L (ref 3.5–5.1)
Sodium: 151 mmol/L — ABNORMAL HIGH (ref 135–145)
Sodium: 150 mmol/L — ABNORMAL HIGH (ref 135–145)
TCO2: 28 mmol/L (ref 22–32)
TCO2: 28 mmol/L (ref 22–32)
pCO2 arterial: 46.8 mmHg (ref 32–48)
pCO2 arterial: 41.8 mmHg (ref 32–48)
pH, Arterial: 7.36 (ref 7.35–7.45)
pH, Arterial: 7.422 (ref 7.35–7.45)
pO2, Arterial: 65 mmHg — ABNORMAL LOW (ref 83–108)
pO2, Arterial: 140 mmHg — ABNORMAL HIGH (ref 83–108)

## 2021-08-27 LAB — GLUCOSE, CAPILLARY
Glucose-Capillary: 101 mg/dL — ABNORMAL HIGH (ref 70–99)
Glucose-Capillary: 109 mg/dL — ABNORMAL HIGH (ref 70–99)
Glucose-Capillary: 124 mg/dL — ABNORMAL HIGH (ref 70–99)
Glucose-Capillary: 163 mg/dL — ABNORMAL HIGH (ref 70–99)
Glucose-Capillary: 84 mg/dL (ref 70–99)
Glucose-Capillary: 97 mg/dL (ref 70–99)

## 2021-08-27 LAB — BASIC METABOLIC PANEL
Anion gap: 11 (ref 5–15)
BUN: 32 mg/dL — ABNORMAL HIGH (ref 6–20)
CO2: 26 mmol/L (ref 22–32)
Calcium: 7.9 mg/dL — ABNORMAL LOW (ref 8.9–10.3)
Chloride: 114 mmol/L — ABNORMAL HIGH (ref 98–111)
Creatinine, Ser: 6.41 mg/dL — ABNORMAL HIGH (ref 0.44–1.00)
GFR, Estimated: 7 mL/min — ABNORMAL LOW (ref >60)
Glucose, Bld: 89 mg/dL (ref 70–99)
Potassium: 4 mmol/L (ref 3.5–5.1)
Sodium: 151 mmol/L — ABNORMAL HIGH (ref 135–145)

## 2021-08-27 IMAGING — DX DG CHEST 1V PORT
1 series · 1 of 1 positions shown · non-contrast
Comparison: Chest radiograph [DATE]

CLINICAL DATA: Endotracheal tube placement.

EXAM:
PORTABLE CHEST 1 VIEW

[chest]
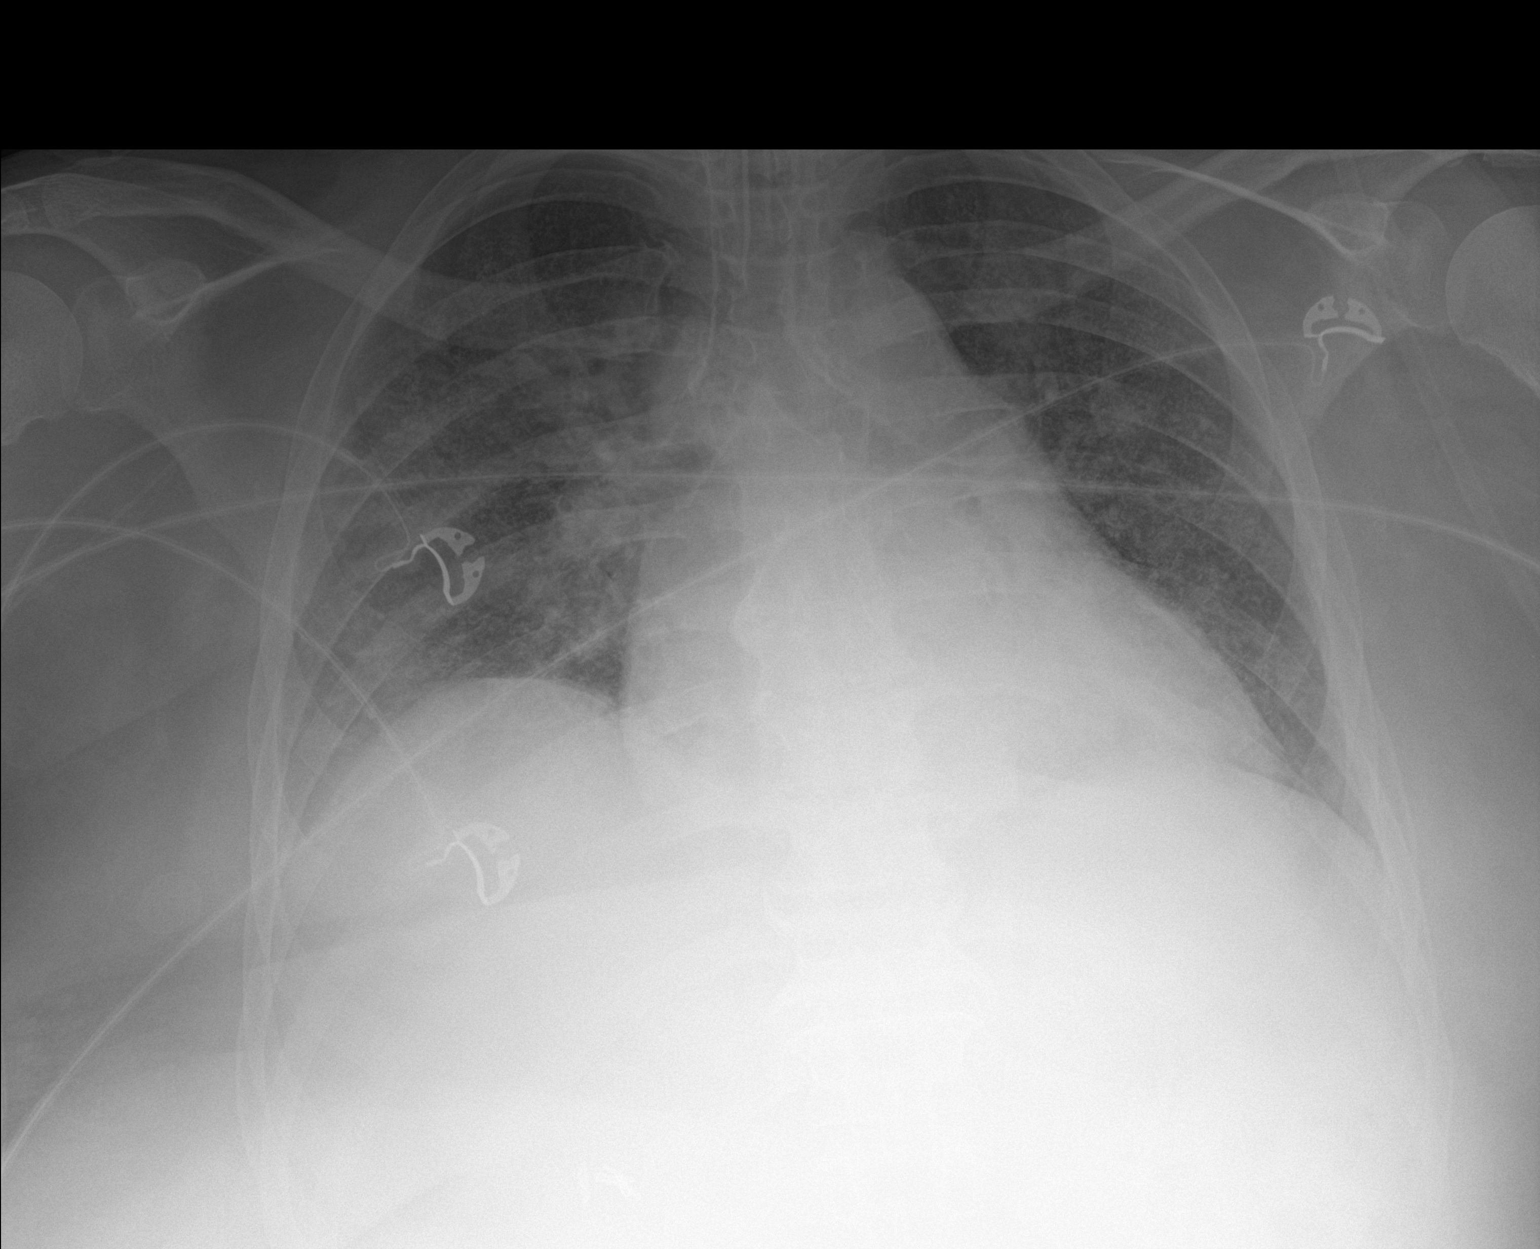

[1 of 1 positions shown; findings below may reference images not displayed]

FINDINGS: Endotracheal tube with tip projecting over the distal thoracic
trachea 2.5 cm from the carina.

Similar cardiac enlargement and central vascular prominence.
Slightly worsened bilateral interstitial and airspace opacities.
Unchanged elevation of the right hemidiaphragm. No visible pleural
effusion or pneumothorax.

No acute osseous abnormality.
IMPRESSION: Endotracheal tube with tip overlying the distal thoracic trachea
cm from the carina.

Similar cardiomegaly and likely pulmonary edema.

## 2021-08-27 IMAGING — DX DG CHEST 1V PORT
1 series · 1 of 1 positions shown · non-contrast
Comparison: Radiograph earlier today.

CLINICAL DATA: Post tracheostomy placement.

EXAM:
PORTABLE CHEST 1 VIEW

[chest]
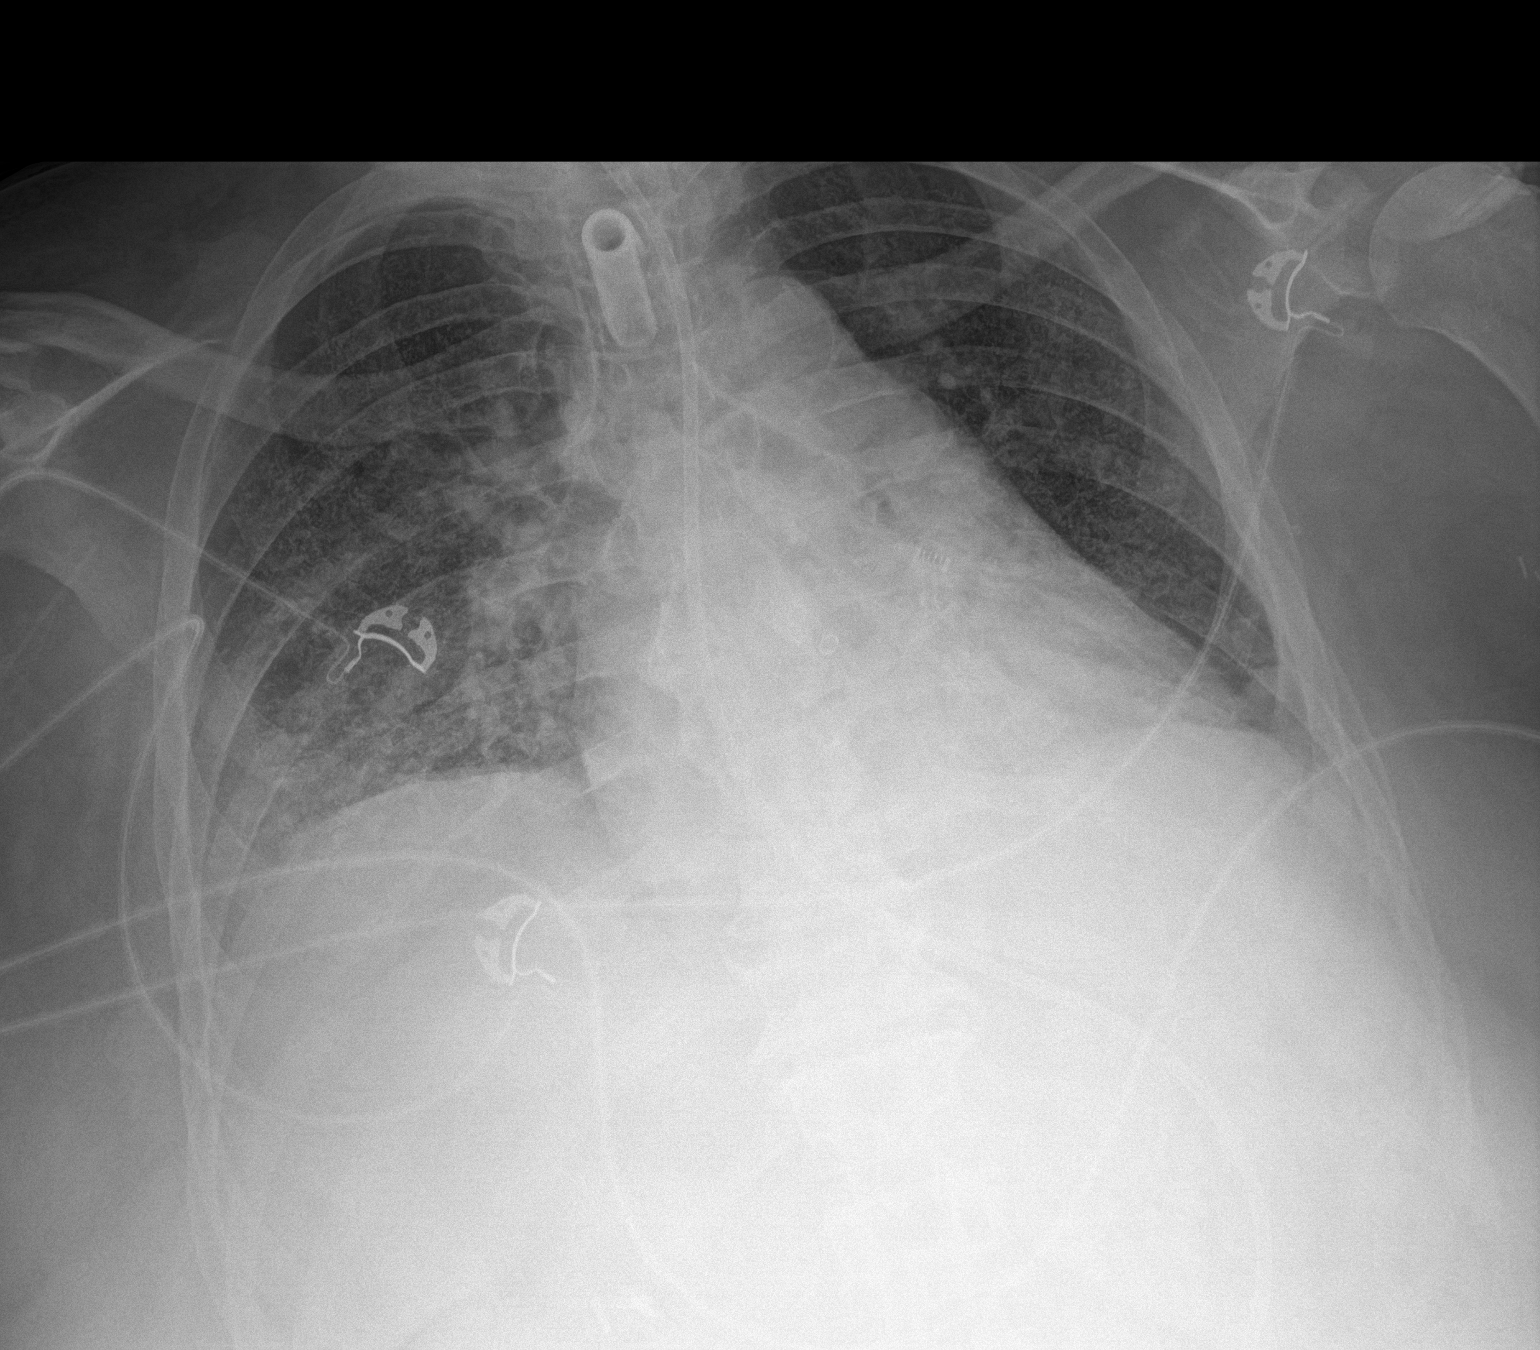

[1 of 1 positions shown; findings below may reference images not displayed]

FINDINGS: New tracheostomy tube with tip overlying the tracheal air column at
the thoracic inlet. Enteric tube with tip below the diaphragm not
included in the field of view. Stable cardiomegaly. Unchanged
mediastinal contours. Persistent low lung volumes. Heterogeneous
bilateral lung opacities which may represent pulmonary edema or
multifocal infection, without significant interval change. No
pneumothorax or evident pneumomediastinum.
IMPRESSION: 1. New tracheostomy tube with tip overlying the tracheal air column
at the thoracic inlet.
2. Heterogeneous bilateral lung opacities which may represent
pulmonary edema or multifocal infection, without significant
interval change.

## 2021-08-27 IMAGING — DX DG ABD PORTABLE 1V
1 series · 1 of 1 positions shown · non-contrast
Comparison: Abdominal radiograph [DATE].

CLINICAL DATA: 55-year-old female status post feeding tube
placement.

EXAM:
PORTABLE ABDOMEN - 1 VIEW

[abdomen]
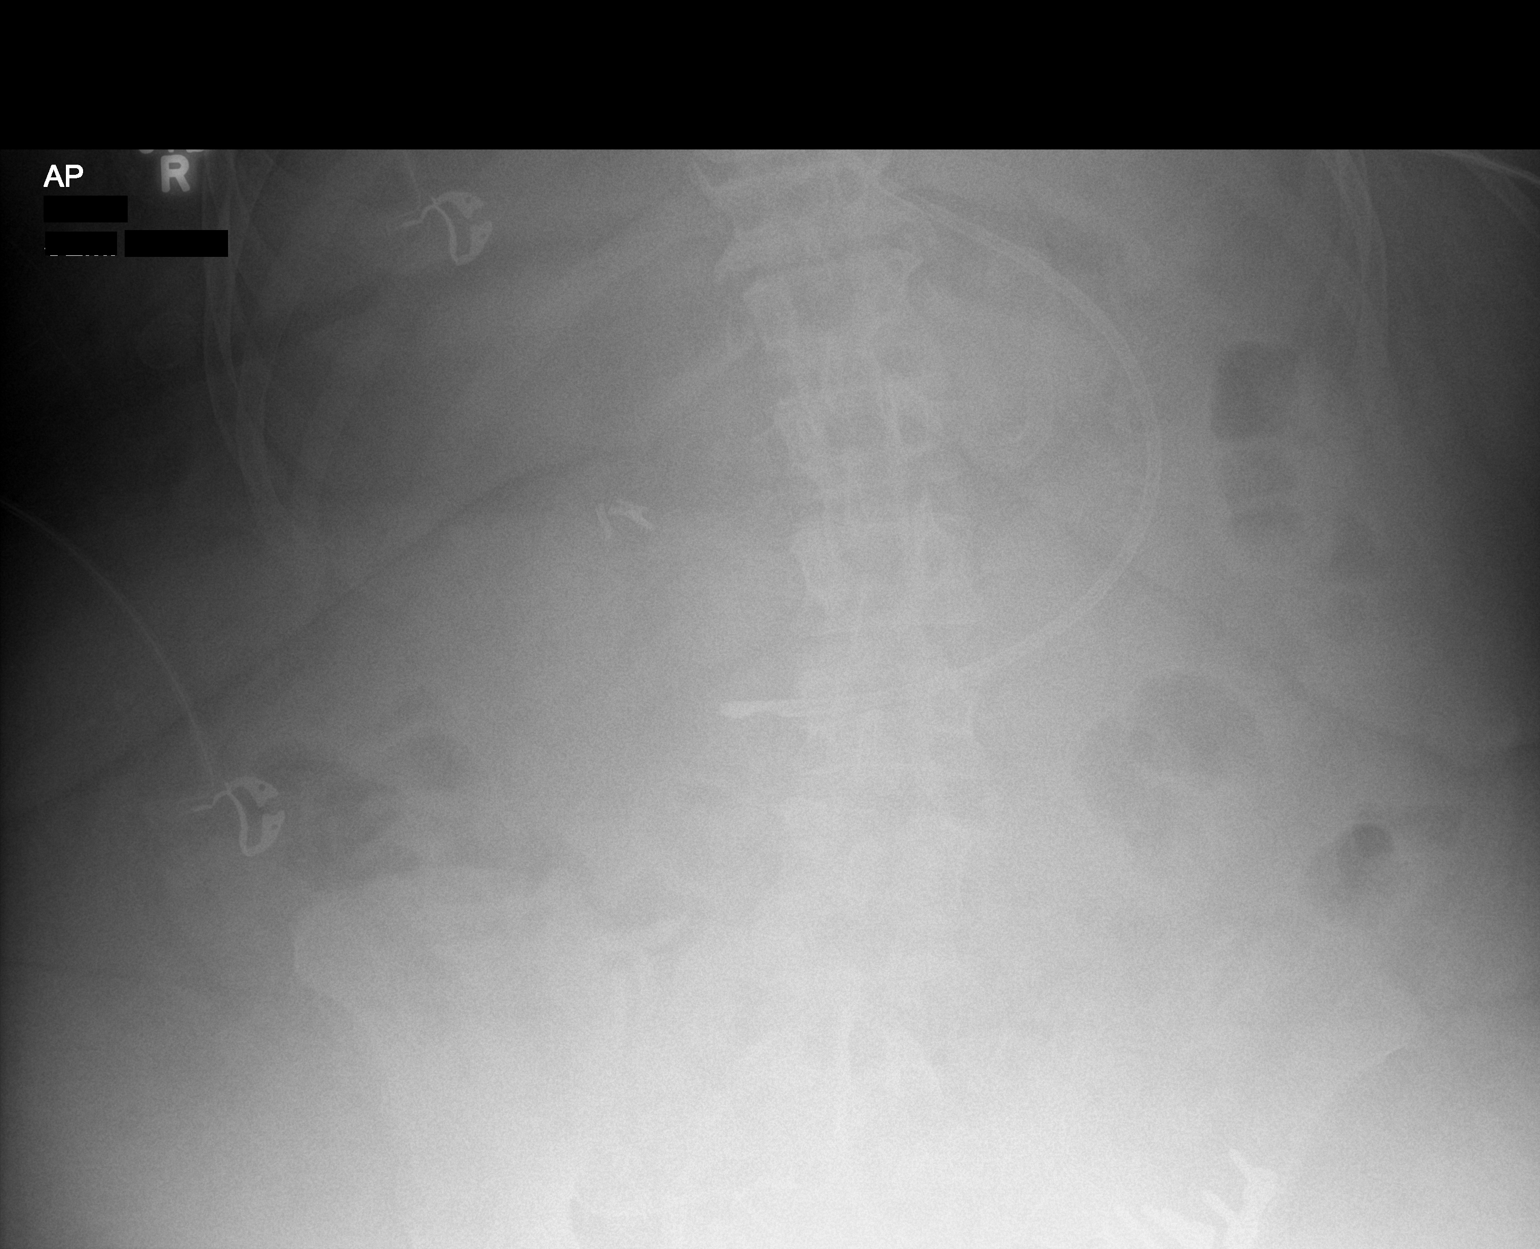

[1 of 1 positions shown; findings below may reference images not displayed]

FINDINGS: Previously noted nasogastric tube appears to have been removed. New
feeding tube noted with tip projecting over the expected location of
the antral pre-pyloric region of the stomach. Surgical clips project
over the right upper quadrant of the abdomen. Visualized bowel gas
pattern is nonobstructive.
IMPRESSION: 1. Tip of feeding tube is likely in the antral pre-pyloric region of
the stomach.

## 2021-08-27 MED ORDER — VITAL AF 1.2 CAL PO LIQD
1000.0000 mL | ORAL | Status: DC
Start: 2021-08-27 — End: 2021-09-08
  Administered 2021-08-27 – 2021-09-07 (×11): 1000 mL

## 2021-08-27 MED ORDER — MIDAZOLAM HCL 2 MG/2ML IJ SOLN
INTRAMUSCULAR | Status: AC
  Administered 2021-08-27: 1 mg via INTRAVENOUS
  Filled 2021-08-27: qty 2

## 2021-08-27 MED ORDER — FENTANYL CITRATE PF 50 MCG/ML IJ SOSY
50.0000 ug | PREFILLED_SYRINGE | INTRAMUSCULAR | Status: DC | PRN
  Filled 2021-08-27: qty 4
  Filled 2021-08-27 (×2): qty 2

## 2021-08-27 MED ORDER — FENTANYL CITRATE PF 50 MCG/ML IJ SOSY: 200.0000 ug | PREFILLED_SYRINGE | Freq: Once | INTRAMUSCULAR | Status: DC

## 2021-08-27 MED ORDER — LABETALOL HCL 100 MG PO TABS
300.0000 mg | ORAL_TABLET | Freq: Three times a day (TID) | ORAL | Status: DC
  Administered 2021-08-27 – 2021-08-29 (×4): 300 mg
  Filled 2021-08-27 (×4): qty 3

## 2021-08-27 MED ORDER — FENTANYL CITRATE PF 50 MCG/ML IJ SOSY
PREFILLED_SYRINGE | INTRAMUSCULAR | Status: AC
  Administered 2021-08-27: 50 ug via INTRAVENOUS
  Filled 2021-08-27: qty 2

## 2021-08-27 MED ORDER — SUCCINYLCHOLINE CHLORIDE 200 MG/10ML IV SOSY
PREFILLED_SYRINGE | INTRAVENOUS | Status: AC
  Filled 2021-08-27: qty 10

## 2021-08-27 MED ORDER — DEXMEDETOMIDINE HCL IN NACL 400 MCG/100ML IV SOLN
0.0000 ug/kg/h | INTRAVENOUS | Status: DC
  Administered 2021-08-27 (×2): 0.6 ug/kg/h via INTRAVENOUS
  Administered 2021-08-27: 0.4 ug/kg/h via INTRAVENOUS
  Administered 2021-08-28: 0.6 ug/kg/h via INTRAVENOUS
  Filled 2021-08-27 (×4): qty 100

## 2021-08-27 MED ORDER — LABETALOL HCL 5 MG/ML IV SOLN
10.0000 mg | INTRAVENOUS | Status: DC | PRN
  Administered 2021-08-27 – 2021-09-13 (×8): 10 mg via INTRAVENOUS
  Filled 2021-08-27 (×9): qty 4

## 2021-08-27 MED ORDER — ROCURONIUM BROMIDE 10 MG/ML (PF) SYRINGE
PREFILLED_SYRINGE | INTRAVENOUS | Status: AC
  Administered 2021-08-27: 50 mg via INTRAVENOUS
  Filled 2021-08-27: qty 10

## 2021-08-27 MED ORDER — ETOMIDATE 2 MG/ML IV SOLN: 30.0000 mg | Freq: Once | INTRAVENOUS | Status: AC

## 2021-08-27 MED ORDER — LIDOCAINE HCL 1 % IJ SOLN
5.0000 mL | Freq: Once | INTRAMUSCULAR | Status: AC
Start: 2021-08-27 — End: 2021-08-27
  Filled 2021-08-27: qty 5

## 2021-08-27 MED ORDER — MIDAZOLAM HCL 2 MG/2ML IJ SOLN
5.0000 mg | Freq: Once | INTRAMUSCULAR | Status: DC
  Filled 2021-08-27: qty 6

## 2021-08-27 MED ORDER — HEPARIN SODIUM (PORCINE) 5000 UNIT/ML IJ SOLN
5000.0000 [IU] | Freq: Three times a day (TID) | INTRAMUSCULAR | Status: AC
Start: 2021-08-27 — End: 2021-09-07
  Administered 2021-08-27 – 2021-09-07 (×34): 5000 [IU] via SUBCUTANEOUS
  Filled 2021-08-27 (×34): qty 1

## 2021-08-27 MED ORDER — CHLORHEXIDINE GLUCONATE 0.12% ORAL RINSE (MEDLINE KIT)
15.0000 mL | Freq: Two times a day (BID) | OROMUCOSAL | Status: DC
  Administered 2021-08-27 – 2021-08-28 (×3): 15 mL via OROMUCOSAL

## 2021-08-27 MED ORDER — ROCURONIUM BROMIDE 10 MG/ML (PF) SYRINGE
PREFILLED_SYRINGE | INTRAVENOUS | Status: AC
  Administered 2021-08-27: 100 mg via INTRAVENOUS
  Filled 2021-08-27: qty 10

## 2021-08-27 MED ORDER — MIDAZOLAM HCL 2 MG/2ML IJ SOLN: 1.0000 mg | Freq: Once | INTRAMUSCULAR | Status: AC

## 2021-08-27 MED ORDER — ROCURONIUM BROMIDE 50 MG/5ML IV SOLN: 50.0000 mg | Freq: Once | INTRAVENOUS | Status: AC

## 2021-08-27 MED ORDER — ETOMIDATE 2 MG/ML IV SOLN
20.0000 mg | Freq: Once | INTRAVENOUS | Status: DC
  Filled 2021-08-27: qty 10

## 2021-08-27 MED ORDER — ROCURONIUM BROMIDE 50 MG/5ML IV SOLN
100.0000 mg | Freq: Once | INTRAVENOUS | Status: DC
  Filled 2021-08-27: qty 10

## 2021-08-27 MED ORDER — MIDAZOLAM HCL 2 MG/2ML IJ SOLN
4.0000 mg | Freq: Once | INTRAMUSCULAR | Status: AC
  Administered 2021-08-27: 4 mg via INTRAVENOUS

## 2021-08-27 MED ORDER — LABETALOL HCL 5 MG/ML IV SOLN
INTRAVENOUS | Status: AC
  Administered 2021-08-27: 10 mg via INTRAVENOUS
  Filled 2021-08-27: qty 4

## 2021-08-27 MED ORDER — MIDAZOLAM HCL 2 MG/2ML IJ SOLN
1.0000 mg | Freq: Once | INTRAMUSCULAR | Status: AC
  Administered 2021-08-27: 1 mg via INTRAVENOUS

## 2021-08-27 MED ORDER — ETOMIDATE 2 MG/ML IV SOLN
INTRAVENOUS | Status: AC
  Administered 2021-08-27: 30 mg via INTRAVENOUS
  Filled 2021-08-27: qty 20

## 2021-08-27 MED ORDER — FENTANYL CITRATE PF 50 MCG/ML IJ SOSY
50.0000 ug | PREFILLED_SYRINGE | INTRAMUSCULAR | Status: AC | PRN
  Administered 2021-08-27 (×3): 50 ug via INTRAVENOUS

## 2021-08-27 MED ORDER — FENTANYL CITRATE PF 50 MCG/ML IJ SOSY: 50.0000 ug | PREFILLED_SYRINGE | Freq: Once | INTRAMUSCULAR | Status: AC

## 2021-08-27 MED ORDER — ORAL CARE MOUTH RINSE
15.0000 mL | OROMUCOSAL | Status: DC
  Administered 2021-08-27 – 2021-08-28 (×13): 15 mL via OROMUCOSAL

## 2021-08-27 MED ORDER — FENTANYL CITRATE PF 50 MCG/ML IJ SOSY
100.0000 ug | PREFILLED_SYRINGE | Freq: Once | INTRAMUSCULAR | Status: AC
  Administered 2021-08-27: 100 ug via INTRAVENOUS

## 2021-08-27 MED ORDER — FENTANYL CITRATE PF 50 MCG/ML IJ SOSY
50.0000 ug | PREFILLED_SYRINGE | Freq: Once | INTRAMUSCULAR | Status: AC
  Administered 2021-08-27: 50 ug via INTRAVENOUS

## 2021-08-27 MED ORDER — ROCURONIUM BROMIDE 10 MG/ML (PF) SYRINGE: 100.0000 mg | PREFILLED_SYRINGE | Freq: Once | INTRAVENOUS | Status: AC

## 2021-08-27 MED ORDER — LIDOCAINE HCL (PF) 1 % IJ SOLN
INTRAMUSCULAR | Status: AC
  Administered 2021-08-27: 5 mL
  Filled 2021-08-27: qty 5

## 2021-08-27 MED ORDER — ETOMIDATE 2 MG/ML IV SOLN
INTRAVENOUS | Status: AC
  Administered 2021-08-27: 30 mg via INTRAVENOUS
  Filled 2021-08-27: qty 10

## 2021-08-27 MED ORDER — EPINEPHRINE PF 1 MG/ML IJ SOLN
INTRAMUSCULAR | Status: AC
  Filled 2021-08-27: qty 1

## 2021-08-27 NOTE — Procedures (Signed)
Percutaneous Tracheostomy Procedure Note   Janet Mitchell  [id number]  1965/11/22  Date:08/27/21  Time:4:28 PM   Provider Performing:Lily Velasquez  Procedure: Percutaneous Tracheostomy with Bronchoscopic Guidance (31600)  Indication(s) Acute respiratory failure  Consent Risks of the procedure as well as the alternatives and risks of each were explained to the patient and/or caregiver.  Consent for the procedure was obtained.  Anesthesia Etomidate, Versed, Fentanyl, Vecuronium   Time Out Verified patient identification, verified procedure, site/side was marked, verified correct patient position, special equipment/implants available, medications/allergies/relevant history reviewed, required imaging and test results available.   Sterile Technique Maximal sterile technique including sterile barrier drape, hand hygiene, sterile gown, sterile gloves, mask, hair covering.    Procedure Description Appropriate anatomy identified by palpation.  Patient's neck prepped and draped in sterile fashion.  1% lidocaine with epinephrine was used to anesthetize skin overlying neck.  1.5cm incision made and blunt dissection performed until tracheal rings could be easily palpated.   Then a size 6 Shiley tracheostomy was placed under bronchoscopic visualization using usual Seldinger technique and serial dilation.   Bronchoscope confirmed placement above the carina.  Tracheostomy was sutured in place with adhesive pad to protect skin under pressure.    Patient connected to ventilator.   Complications/Tolerance None; patient tolerated the procedure well. Chest X-ray is ordered to confirm no post-procedural complication.   EBL Minimal   Specimen(s) None

## 2021-08-27 NOTE — Progress Notes (Signed)
Initial Nutrition Assessment  DOCUMENTATION CODES:   Obesity unspecified  INTERVENTION:   Vital 1.2'@70ml'$ /hr- Initiate at 4m/hr and increase by 118mhr q 8 hours until goal rate is reached.   Free water flushes 3072m4 hours to maintain tube patency   Regimen provides 2016kcal/day, 126g/day protein and 1362m68my of free water (provides 1542ml69m free water with flushes).   Pt at moderate refeed risk; recommend monitor potassium, magnesium and phosphorus labs daily until stable  Rena-vit daily via tube   NUTRITION DIAGNOSIS:   Inadequate oral intake related to inability to eat (pt sedated and ventilated) as evidenced by NPO status.  GOAL:   Provide needs based on ASPEN/SCCM guidelines  MONITOR:   Vent status, Labs, Weight trends, TF tolerance, Skin, I & O's  REASON FOR ASSESSMENT:   Ventilator    ASSESSMENT:   55 y/37female with h/o ESRD on HD, HTN, DM, COVID 19 (03/2019) and CHF who is admitted with right thalamus ICH with IVH and obstructive hydrocephalus s/p right frontal ventriculostomy with EVD placement 5/29.  -Pt with L AV fistula -Pt s/p cortak placement today- noted in distal stomach  Pt extubated yesterday and re-intubated today for airway protection. Plan is for tracheostomy today. Cortrak tube placed today; will plan to initiate tube feeds after procedure. Pt is likely at moderate refeed risk. Per chart review, pt in her normal state of health until day of admission. Pt did present with nausea and vomiting but none noted since admission. Per chart, pt appears fairly weight stable pta. Pt up ~11lbs from her last documented chart weight; pt is noted to have edema. Pt +6.8L on her I & Os. Plan is for HD today.   Medications reviewed and include: calcitriol, etomidate, heparin, insulin, protonix, senokot, cleviprex, zosyn   Labs reviewed: Na 150(H), K 3.7 wnl, BUN 32(H), creat 6.41(H), ionized Ca 1.07(L), P 3.1 wnl, Mg 1.9 wnl Hgb 10.9(L), Hct 32.0(L) Cbgs-  84, 97, 109 x 24 hrs AIC 6.2(H)- 5/29  Patient is currently intubated on ventilator support MV: 7.5 L/min Temp (24hrs), Avg:99.3 F (37.4 C), Min:98.7 F (37.1 C), Max:99.9 F (37.7 C)  Cleviprex- 42ml/68mprovides 2016kcal/day   MAP- >65mmHg53mrains- 180ml   76mITION - FOCUSED PHYSICAL EXAM:  Flowsheet Row Most Recent Value  Orbital Region No depletion  Upper Arm Region No depletion  Thoracic and Lumbar Region No depletion  Buccal Region No depletion  Temple Region No depletion  Clavicle Bone Region No depletion  Clavicle and Acromion Bone Region No depletion  Scapular Bone Region Unable to assess  Dorsal Hand No depletion  Patellar Region No depletion  Anterior Thigh Region No depletion  Posterior Calf Region No depletion  Edema (RD Assessment) Moderate  Hair Reviewed  Eyes Reviewed  Mouth Unable to assess  Skin Reviewed  Nails Reviewed   Diet Order:   Diet Order             Diet NPO time specified  Diet effective now                  EDUCATION NEEDS:   No education needs have been identified at this time  Skin:  Skin Assessment: Reviewed RN Assessment (incision toe)  Last BM:  6/2- type 6  Height:   Ht Readings from Last 1 Encounters:  08/23/21 '5\' 7"'$  (1.702 m)    Weight:   Wt Readings from Last 1 Encounters:  08/24/21 104.4 kg    Ideal Body Weight:  61.3 kg  BMI:  Body mass index is 36.05 kg/m.  Estimated Nutritional Needs:   Kcal:  1900-2200kcal/day  Protein:  >125g/day  Fluid:  1.9-2.2L/day  Koleen Distance MS, RD, LDN Please refer to [facility] for RD and/or RD on-call/weekend/after hours pager

## 2021-08-27 NOTE — Progress Notes (Signed)
Subjective: Patient reports extubated yesterday. She has been having an increased WOB and difficulty with protecting her airway. She continues to follow commands on the right, but is more obtunded today.  Objective: Vital signs in last 24 hours: Temp:  [97.8 F (36.6 C)-99.9 F (37.7 C)] 99.4 F (37.4 C) (06/02 0400) Pulse Rate:  [71-88] 84 (06/02 0700) Resp:  [14-34] 18 (06/02 0700) BP: (107-150)/(50-113) 126/57 (06/02 0700) SpO2:  [94 %-100 %] 98 % (06/02 0700) Arterial Line BP: (128-155)/(55-72) 147/68 (06/02 0700) FiO2 (%):  [30 %] 30 % (06/01 0838)  Intake/Output from previous day: 06/01 0701 - 06/02 0700 In: 1313 [I.V.:1189.6; IV Piggyback:123.5] Out: 180 [Drains:180] Intake/Output this shift: No intake/output data recorded.  Physical Exam: Patient extubated on on nasal canula. She appears to be in moderate distress with increase work of breathing and accessory muscle use. She is not alert but will open eyes to voice and follows simple commands on the right. No purposeful movement on the left. Pupils 27m NR. EVD site is intact with good pulsatile flow.    Lab Results: Recent Labs    08/25/21 0551 08/26/21 0423 08/27/21 0359  WBC 5.2 5.6  --   HGB 10.3* 11.0* 11.2*  HCT 32.7* 34.7* 33.0*  PLT 91* 96*  --    BMET Recent Labs    08/25/21 0551 08/25/21 0821 08/26/21 0423 08/26/21 0819 08/27/21 0359  NA 147*   < > 147* 150* 151*  K 4.2  --  3.5  --  3.8  CL 112*  --  111  --   --   CO2 25  --  27  --   --   GLUCOSE 145*  --  108*  --   --   BUN 45*  --  21*  --   --   CREATININE 7.01*  --  3.91*  --   --   CALCIUM 8.2*  --  8.4*  --   --    < > = values in this interval not displayed.    Studies/Results: MR BRAIN WO CONTRAST  Result Date: 08/25/2021 CLINICAL DATA:  Neuro deficit, acute, stroke suspected. Nontraumatic intracranial hemorrhage. Altered mental status. EXAM: MRI HEAD WITHOUT CONTRAST TECHNIQUE: Multiplanar, multiecho pulse sequences of the brain  and surrounding structures were obtained without intravenous contrast. COMPARISON:  Multiple CT studies 08/23/2021.  MRI 04/02/2019 FINDINGS: Brain: Right thalamic intraparenchymal hematoma as seen previously, proximally maximal dimension 3.6 cm. I do not believe the hematoma has increased in size. There is surrounding edema including extension into the right midbrain and pons. Small amount of intraventricular blood as seen previously. Right frontal ventriculostomy enters the frontal horn of the right lateral ventricle and crosses the midline towards the left. Ventricular size is stable. Numerous other punctate foci of hemosiderin deposition scattered throughout the brain consistent with old hypertensive microhemorrhages. There is a subcentimeter acute infarction affecting the cingulate gyrus on the right. Vascular: Major vessels at the base of the brain show flow. Skull and upper cervical spine: Otherwise negative Sinuses/Orbits: Clear/normal Other: None IMPRESSION: Comparing techniques, no change in size of the right thalamic intraparenchymal hemorrhage is suspected, maximal dimension 3.6 cm by MRI. Surrounding edema, likely slightly more prominent than on the most recent CT. Right ventriculostomy remains in place without evidence of developing hydrocephalus. Small amount of intraventricular blood appears similar. Subcentimeter acute infarction of the right cingulate gyrus. Old small vessel insults with hemosiderin deposition consistent with hypertensive microangiopathy. Electronically Signed   By: MElta Guadeloupe  Shogry M.D.   On: 08/25/2021 15:35   DG CHEST PORT 1 VIEW  Result Date: 08/26/2021 CLINICAL DATA:  Diffuse bilateral infiltrates, dialysis patient. EXAM: PORTABLE CHEST 1 VIEW COMPARISON:  Aug 23, 2021. FINDINGS: EKG leads project over the chest. Endotracheal tube terminates 3.4 cm from the carina. Gastric tube tip in the mid to distal stomach, side port below GE junction. Cardiomediastinal contours remain  enlarged. Hilar structures remain indistinct. Increased interstitial markings throughout the chest remain evident with diminished alveolar opacity with ill-defined characteristics seen throughout the chest. Basilar airspace disease may be slightly increased with blunting of LEFT costodiaphragmatic sulcus and RIGHT costodiaphragmatic sulcus. No pneumothorax. On limited assessment there is no acute skeletal finding. IMPRESSION: 1. Continued bilateral interstitial and airspace disease with diminished alveolar opacities. Findings favor slight improvement with respect to pulmonary edema but with developing basilar airspace disease and small effusions. Correlate with any signs of infection. 2. Cardiomegaly as before. Electronically Signed   By: Zetta Bills M.D.   On: 08/26/2021 09:56    Assessment/Plan: 56 year old female who presented to the ED with nausea, vomiting, and left-sided hemiparesis who was found to have a large thalamic bleed with intraventricular extension. While in the emergency department, her neurological status declined and required emergent intubation for airway protection.  Follow-up CT scan today revealed progression of the hemorrhage with extension into upper midbrain with development of hydrocephalus. EVD placed on 08/23/2021. Follow up CT head after EVD placement revealed stable right thalamic bleed with a slight decrease in the lateral and third ventricles. The day following EVD placement, she began to follow commands on the right. Left hemiplegia persists. She was extubated yesterday and has had a slow decline in her respiratory status that is necessitating reintubation today. EVD continues to have good pulsatile flow. Continue supportive care.   -Continue EVD at 10 cm H20   LOS: 4 days     Marvis Moeller, DNP, AGNP-C Neurosurgery Nurse Practitioner  [facility] [place] [address], [address], [place], [place] [address] P: [phone]    F:  [phone]  08/27/2021 7:48 AM

## 2021-08-27 NOTE — Progress Notes (Signed)
PT Cancellation Note  Patient Details Name: Janet Mitchell MRN: [id number] DOB: Feb 24, 1966   Cancelled Treatment:    Reason Eval/Treat Not Completed: Medical issues which prohibited therapy; undergoing re-intubation/trach today.  Have checked on pt 4 days and not yet stable.  Will sign off and await new orders when stable.    Reginia Naas 08/27/2021, 10:09 AM Magda Kiel, PT Acute Rehabilitation Services PJSRP:[phone] [phone] 08/27/2021

## 2021-08-27 NOTE — Progress Notes (Signed)
Verbal order by Dr Tacy Learn to place 2 pre-split quick clot dressings around the patient's trach because it is oozing blood.  Montez Hageman RN

## 2021-08-27 NOTE — Progress Notes (Signed)
[facility] Progress Note  Subjective: pt seen in ICU. Not handling secretions will and will likely have to be reintubated , per RN. Husband at bedside.   Vitals:   08/27/21 0400 08/27/21 0500 08/27/21 0600 08/27/21 0700  BP: (!) 120/55 117/66 114/70 (!) 126/57  Pulse: 84 88 83 84  Resp: (!) 21 (!) '23 17 18  '$ Temp: 99.4 F (37.4 C)     TempSrc: Axillary     SpO2: 98% 99% 98% 98%  Weight:      Height:        Exam:  Not in distress, eyes closed, not responding   no jvd  Chest cta bilat  Cor reg no RG  Abd soft ntnd no ascites   Ext 1-2+ bilat UE edema, no hip or LE edema   Pt is altered/ lethargic, neuro exam not done   AVF+ bruit       Home meds include - asa, tylenol, renavite, cadexomer iodine, phoslo , ceterizine, cholestyramine, hydralazine 10 bid, labetalol 200 bid, losartan 100, psyllium        OP HD: [facility] MWF  4h  97.5kg  450/500  2/2.5 bath  15ga  AVF  Hep none - mircera 100 ug q2 - rocaltrol 1.0 mcg po tiw     Assessment/ Plan: R thalamus ICH - w/ IVH and hydrocephalus. Drain placed per neurosurgery 5/29. SP 3% saline, dc'd now.  VDRF - extubated now. F/U CXR 6/01 showed sig improvement from last film.  HTN'sive emergency - uncontrolled HTN, better. Getting home labetalol/ hydralazine/ losartan + new norvasc per tube. Also cleviprex gtt, weaning down per RN.  Vol - bilat UE edema, no other edema. Wt's are up 6-7kg. UFG 3 L w/ HD today  ESRD - on HD MWF. Had HD here Tues and Wed. HD today.  Atrial fib - on BB, a/c's on hold Anemia esrd - Hb 11- 13, no esa needs MBD ckd - CCa and phos in range. No need for binder unless eating bolus meals. Cont po vdra per tube.  Nutrition - cont renavite per NG. If tube feeds needed please use low K+ preparation (I/e., Nepro).        Rob Jamis Kryder 08/25/2021, 9:04 AM   Recent Labs  Lab 08/23/21 0632 08/23/21 0640 08/25/21 0551 08/26/21 0423 08/27/21 0359  HGB 10.9*   < > 10.3* 11.0*  11.2*  ALBUMIN 3.8  --   --   --   --   CALCIUM 8.5*   < > 8.2* 8.4*  --   PHOS  --    < > 5.8* 3.1  --   CREATININE 9.25*   < > 7.01* 3.91*  --   K 5.2*   < > 4.2 3.5 3.8   < > = values in this interval not displayed.    Inpatient medications:  amLODipine  10 mg Per Tube Daily   calcitRIOL  1 mcg Per Tube Q M,W,F   Chlorhexidine Gluconate Cloth  6 each Topical Q0600   Chlorhexidine Gluconate Cloth  6 each Topical Q0600   hydrALAZINE  100 mg Per Tube Q8H   insulin aspart  0-6 Units Subcutaneous Q4H   labetalol  200 mg Per Tube TID   losartan  100 mg Per Tube Daily   mouth rinse  15 mL Mouth Rinse BID   multivitamin  1 tablet Per Tube QHS   pantoprazole sodium  40 mg Per Tube Daily   scopolamine  1 patch Transdermal  Q72H   senna-docusate  1 tablet Per Tube BID    clevidipine 21 mg/hr (08/27/21 0629)   piperacillin-tazobactam (ZOSYN)  IV 2.25 g (08/27/21 0443)   acetaminophen **OR** acetaminophen (TYLENOL) oral liquid 160 mg/5 mL **OR** acetaminophen, diphenhydrAMINE, ipratropium-albuterol, ondansetron (ZOFRAN) IV, polyvinyl alcohol

## 2021-08-27 NOTE — Procedures (Signed)
Bedside Tracheostomy Insertion Procedure Note   Patient Details:   Name: Janet Mitchell DOB: Aug 12, 1965 MRN: [id number]  Procedure: Tracheostomy  Pre Procedure Assessment: ET Tube Size:7.5 ET Tube secured at lip (cm):23 Bite block in place: No Breath Sounds: Clear and Diminished  Post Procedure Assessment: BP (!) 158/68   Pulse 74   Temp 98.8 F (37.1 C) (Axillary)   Resp 16   Ht '5\' 7"'$  (1.702 m)   Wt 104.4 kg   SpO2 100%   BMI 36.05 kg/m  O2 sats: stable throughout Complications: No apparent complications Patient did tolerate procedure well Tracheostomy Brand:Shiley Tracheostomy Style:Cuffed Tracheostomy Size:  6 Tracheostomy Secured IPJ:[id number] Tracheostomy Placement Confirmation:Trach cuff visualized and in place    Vilinda Blanks 08/27/2021, 3:22 PM

## 2021-08-27 NOTE — Procedures (Signed)
Cortrak  Tube Type:  Cortrak - 43 inches Tube Location:  Left nare Initial Placement:  Stomach Secured by: Bridle Technique Used to Measure Tube Placement:  Marking at nare/corner of mouth Cortrak Secured At:  71 cm  Cortrak Tube Team Note:  Consult received to place a Cortrak feeding tube.   X-ray is required, abdominal x-ray has been ordered by the Cortrak team. Please confirm tube placement before using the Cortrak tube.   If the tube becomes dislodged please keep the tube and contact the Cortrak team at [email or web address] (password [email or web address]) for replacement.  If after hours and replacement cannot be delayed, place a NG tube and confirm placement with an abdominal x-ray.    Koleen Distance MS, RD, LDN Please refer to [facility] for RD and/or RD on-call/weekend/after hours pager

## 2021-08-27 NOTE — Progress Notes (Signed)
SLP Cancellation Note  Patient Details Name: Janet Mitchell MRN: [id number] DOB: [birth year]   Cancelled treatment:       Reason Eval/Treat Not Completed: Patient not medically ready- pt re-intubated. Will sign off.  Kanyla Omeara L. Tivis Ringer, [place] Office number [phone] Pager [phone]    Juan Quam Laurice 08/27/2021, 10:11 AM

## 2021-08-27 NOTE — Procedures (Signed)
Intubation Procedure Note  Janet Mitchell  [id number]  1965/10/11  Date:08/27/21  Time:9:42 AM   Provider Performing:Curley Hogen D. Harris    Procedure: Intubation (31500)  Indication(s) Respiratory Failure  Consent Risks of the procedure as well as the alternatives and risks of each were explained to the patient and/or caregiver.  Consent for the procedure was obtained and is signed in the bedside chart   Anesthesia Etomidate, Versed, Fentanyl, and Rocuronium   Time Out Verified patient identification, verified procedure, site/side was marked, verified correct patient position, special equipment/implants available, medications/allergies/relevant history reviewed, required imaging and test results available.   Sterile Technique Usual hand hygeine, masks, and gloves were used   Procedure Description Patient positioned in bed supine.  Sedation given as noted above.  Patient was intubated with endotracheal tube using Glidescope.  View was Grade 2 only posterior commissure .  Number of attempts was 1.  Colorimetric CO2 detector was consistent with tracheal placement.   Complications/Tolerance None; patient tolerated the procedure well. Chest X-ray is ordered to verify placement.   EBL Minimal   Specimen(s) None  Janet Mitchell D. Kenton Kingfisher, NP-C [facility] Personal contact information can be found on Amion  08/27/2021, 9:43 AM

## 2021-08-27 NOTE — Procedures (Signed)
Diagnostic Bronchoscopy  Janet Mitchell  [id number]  [birth year]  Date:08/27/21  Time:3:16 PM   Provider Performing:Erina Hamme D. Harris   Procedure: Diagnostic Bronchoscopy (79480)  Indication(s) Assist with direct visualization of tracheostomy placement  Consent Risks of the procedure as well as the alternatives and risks of each were explained to the patient and/or caregiver.  Consent for the procedure was obtained.   Anesthesia See separate tracheostomy note   Time Out Verified patient identification, verified procedure, site/side was marked, verified correct patient position, special equipment/implants available, medications/allergies/relevant history reviewed, required imaging and test results available.   Sterile Technique Usual hand hygiene, masks, gowns, and gloves were used   Procedure Description Bronchoscope advanced through endotracheal tube and into airway.  After suctioning out tracheal secretions, bronchoscope used to provide direct visualization of tracheostomy placement.   Complications/Tolerance None; patient tolerated the procedure well.   EBL None  Specimen(s) None  Janet Deason D. Kenton Kingfisher, NP-C [facility] Personal contact information can be found on Amion  08/27/2021, 3:16 PM

## 2021-08-27 NOTE — Progress Notes (Signed)
OT Cancellation Note  Patient Details Name: Janet Mitchell MRN: [id number] DOB: [birth year]   Cancelled Treatment:    Reason Eval/Treat Not Completed: Medical issues which prohibited therapy; undergoing re-intubation/trach today.  Have checked on pt 4 days and not yet stable.  Will sign off and await new orders when stable.  [name], OTR/L Acute Rehab Services [facility] [place] [phone] Office [phone]    [name] 08/27/2021, 10:14 AM

## 2021-08-27 NOTE — Progress Notes (Addendum)
STROKE TEAM PROGRESS NOTE   INTERVAL HISTORY Patient reintubated, failing to protect airway. She is able to follow commands on the right side, no cough or gag. Plan to trach later today. EVD remains in place. Discussed with family and CCM at the bedside.   Vitals:   08/27/21 1230 08/27/21 1245 08/27/21 1300 08/27/21 1315  BP: (!) 126/56 (!) 139/92 (!) 140/92 (!) 144/59  Pulse: 76 75 74 74  Resp: '16 16 16 16  '$ Temp:      TempSrc:      SpO2: 100% 100% 100% 100%  Weight:      Height:       CBC:  Recent Labs  Lab 08/23/21 0632 08/23/21 0640 08/25/21 0551 08/26/21 0423 08/27/21 0359 08/27/21 1123  WBC 4.0   < > 5.2 5.6  --   --   NEUTROABS 2.2  --   --   --   --   --   HGB 10.9*   < > 10.3* 11.0* 11.2* 10.9*  HCT 35.8*   < > 32.7* 34.7* 33.0* 32.0*  MCV 81.0   < > 79.6* 79.8*  --   --   PLT 90*   < > 91* 96*  --   --    < > = values in this interval not displayed.    Basic Metabolic Panel:  Recent Labs  Lab 08/25/21 0551 08/25/21 0821 08/26/21 0423 08/26/21 0819 08/27/21 0840 08/27/21 1123  NA 147*   < > 147*   < > 151* 150*  K 4.2  --  3.5   < > 4.0 3.7  CL 112*  --  111  --  114*  --   CO2 25  --  27  --  26  --   GLUCOSE 145*  --  108*  --  89  --   BUN 45*  --  21*  --  32*  --   CREATININE 7.01*  --  3.91*  --  6.41*  --   CALCIUM 8.2*  --  8.4*  --  7.9*  --   MG 1.9  --  1.9  --   --   --   PHOS 5.8*  --  3.1  --   --   --    < > = values in this interval not displayed.    Lipid Panel:  Recent Labs  Lab 08/23/21 1020 08/24/21 1329 08/25/21 0821  CHOL 178  --   --   TRIG 383*   < > 176*  HDL 63  --   --   CHOLHDL 2.8  --   --   VLDL 77*  --   --   LDLCALC 38  --   --    < > = values in this interval not displayed.    HgbA1c:  Recent Labs  Lab 08/23/21 1020  HGBA1C 6.2*    Urine Drug Screen: No results for input(s): LABOPIA, COCAINSCRNUR, LABBENZ, AMPHETMU, THCU, LABBARB in the last 168 hours.  Alcohol Level  Recent Labs  Lab  08/23/21 0632  ETH <10     IMAGING past 24 hours DG CHEST PORT 1 VIEW  Result Date: 08/27/2021 CLINICAL DATA:  Endotracheal tube placement. EXAM: PORTABLE CHEST 1 VIEW COMPARISON:  Chest radiograph August 26, 2021 FINDINGS: Endotracheal tube with tip projecting over the distal thoracic trachea 2.5 cm from the carina. Similar cardiac enlargement and central vascular prominence. Slightly worsened bilateral interstitial and airspace opacities. Unchanged elevation of  the right hemidiaphragm. No visible pleural effusion or pneumothorax. No acute osseous abnormality. IMPRESSION: Endotracheal tube with tip overlying the distal thoracic trachea 2.5 cm from the carina. Similar cardiomegaly and likely pulmonary edema. Electronically Signed   By: Dahlia Bailiff M.D.   On: 08/27/2021 10:04   DG Abd Portable 1V  Result Date: 08/27/2021 CLINICAL DATA:  56 year old female status post feeding tube placement. EXAM: PORTABLE ABDOMEN - 1 VIEW COMPARISON:  Abdominal radiograph 08/23/2021. FINDINGS: Previously noted nasogastric tube appears to have been removed. New feeding tube noted with tip projecting over the expected location of the antral pre-pyloric region of the stomach. Surgical clips project over the right upper quadrant of the abdomen. Visualized bowel gas pattern is nonobstructive. IMPRESSION: 1. Tip of feeding tube is likely in the antral pre-pyloric region of the stomach. Electronically Signed   By: Vinnie Langton M.D.   On: 08/27/2021 11:58    PHYSICAL EXAM  Physical Exam  Constitutional: Appears well-developed and well-nourished.  Cardiovascular: Normal rate and regular rhythm.  Respiratory: Effort normal, non-labored breathing, on vent with weaning  Neuro - intubated on precedex, eyes open with mild left ptosis, able to follow simple commands. With eye opening, eyes in right gaze preference position, barely cross midline, blinking to visual threat on the right but not on the left, PERRL. Corneal  reflex present, gag and cough present. Breathing over the vent.  Facial symmetry not able to test due to ET tube.  Tongue protrusion not cooperative.  Right side upper and lower extremity spontaneous movement, left upper and lower extremity hemiplegia with mild withdraw on the LLE with pain. Sensation, coordination not cooperative and gait not tested.   ASSESSMENT/PLAN Ms. Roshanna Cimino Pinnix is a 56 y.o. female with history of ESRD on HD MWF, CHF, HTN, DM presenting with nausea, vomiting, left side weakness present upon waking. Intubated emergently at [facility] and transferred to [facility] on 5/59.  NSGY consulted, EVD placement 5/29. MRI shows stable size in right thalamic hemorrhage with edema extending to the pons and midbrain. EVD still in place. Extubated 08/26/2021. Reintubated 08/27/2021 per CCM trach later today.   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 Small volume intraventricular extension of hemorrhage. Mild hydrocephalus. 5/29 Head CT 2200- decrease in size of lateral and third ventricles s/p EVD placement  CTA head & neck No arterial lesion underlying the right thalamic hematoma. Equivocal for spot sign which would be an adverse finding suggesting continued growth MRI  no change in size of the right thalamic intraparenchymal hemorrhage extending to midbrain, maximal dimension 3.6 cm by MRI. Surrounding edema, likely slightly more prominent. 2D Echo EF 60-65%, consistent with grade II diastolic dysfunction EEG - mild diffuse encephalopathy LDL 38 HgbA1c 6.2 VTE prophylaxis - SCDs aspirin 81 mg daily prior to admission, now on No antithrombotic.  Therapy recommendations:  Pending Disposition:   Pending  Obstructive Hydrocephalus NSGY on board 5/29- EVD placement  Follow up CT shows slightly decrease in size of lateral and third ventricle  Plan for increase level and attempt clamping over the weekend.   Cerebral edema On 3% saline @ 75- d/c today Given ESRD, try to avoid fluid overload, will  d/c 3% saline Na goal 150-155 Na 142->146->141->147 -> 150  Acute Hypoxic Respiratory failure Intubated 5/29 CCM managing vent Extubated 6/1 Reintubated and s/p trach 6/2  Hypertensive emergency Home meds:  labetolol, hydralazine, losartan Stable BP less than 160 On losartan 100, labetalol 200 3 times daily, hydralazine 50 3 times daily Add amlodipine 10 Long-term BP goal normotensive Still on cleviprex, taper off as able  End Stage Renal Failure on hemodialysis MWF Nephrology consulted Dialysis done 5/30 Monitor BMP/electrolytes  Diabetes Mellitus A1c 6.2, controlled CBGs SSI Close PCP follow-up  Other Stroke Risk Factors Obesity, Body mass index is 36.05 kg/m., BMI >/= 30 associated with increased stroke risk, recommend weight loss, diet and exercise as appropriate  Congestive heart failure  Other Active Problems Hyperkalemia  K 6.1->4.2-> 3.5, s/p HD and Lokelma Dysphagia - cortrak placed on [facility] day # 4  Patient seen and examined by NP/APP with MD. MD to update note as needed.   Janine Ores, DNP, FNP-BC [facility] Pager: [phone]  ATTENDING NOTE: I reviewed above note and agree with the assessment and plan. Pt was seen and examined.   Husband at bedside.  Patient lying in bed, with respiratory distress, however still follows commands on the right, still has left gaze preference, left hemiplegia.  Discussed with CCM Dr. Tacy Learn, patient will be intubated and plan for tracheostomy this afternoon.  We will put on core track for p.o. access and tube feeding.  Continue BP control, sodium goal 1 50-1 55.  Appreciate CCM  assistance  For detailed assessment and plan, please refer to above as I have made changes wherever appropriate.   [name], MD PhD Stroke Neurology 08/27/2021 6:55 PM  This patient is critically ill due to right thalamic ICH extending to midbrain, IVH, hydrocephalus, hypertensive emergency, respiratory failure and at significant risk of neurological worsening, death form brain herniation, obstructive hydrocephalus, respiratory failure. This patient's care requires constant monitoring of vital signs, hemodynamics, respiratory and cardiac monitoring, review of multiple databases, neurological assessment, discussion with family, other specialists and medical decision making of high complexity. I spent 35 minutes of neurocritical care time in the care of this patient.  I discussed with CCM Dr. Tacy Learn. I had long discussion with husband at bedside, updated pt current condition, treatment plan and potential prognosis, and answered all the questions.  He expressed understanding and appreciation.       To contact Stroke Continuity provider, please refer to [email or web address] After hours, contact General Neurology

## 2021-08-27 NOTE — Progress Notes (Signed)
PCCM Progress Note  On reassessment this am with attending physician decision was made, in collaboration with family, to reintubate and precede directly with bedside tracheostomy given concern for inability to protect airway and high concern for continued aspiration.   Janet Ghrist D. Kenton Kingfisher, NP-C [facility] Personal contact information can be found on Amion  08/27/2021, 8:45 AM

## 2021-08-27 NOTE — Telephone Encounter (Signed)
DOS - 09/24/21  OSTECTOMY LEFT --- 37366 TISSUE TRANSFER LEFT --- 14020  [employer] EFFECTIVE DATE - 03/28/21  PLAN DEDUCTIBLE - $233.00 W/ $0.00 REMAINING OUT OF POCKET - $8,300.00  W/ $4,274.03 REMAINING COINSURANCE - 20% COPAY - $0.00   PER [employer] WEBSITE FOR CPT CODES 81594 AND 70761 Notification or Prior Authorization is not required for the requested services  Decision ID #:[id number]

## 2021-08-27 NOTE — Progress Notes (Signed)
   08/27/21 1415  Clinical Encounter Type  Visited With Patient not available  Visit Type Spiritual support  Referral From Family  Consult/Referral To Chaplain   Chaplain stopped by to see patient and speak with family members; however, none were present at the time. Patient was being prepared by medical team for tracheotomy. Chaplain will follow-up visit at a later time.   Melody Haver, Resident Chaplain [phone]

## 2021-08-27 NOTE — Progress Notes (Signed)
PT NT suctioned to help maintain patent airway. Pt does has weak cough, however was able to suction out a moderate amount of pale yellow secretions. Pt did cough during procedure. Will continue to monitor.

## 2021-08-27 NOTE — Progress Notes (Signed)
NAME:  Janet Mitchell, MRN:  [id number], DOB:  [birth year], LOS: 4 ADMISSION DATE:  08/23/2021, CONSULTATION DATE:  08/23/2021 REFERRING MD:  Dr. Rory Percy - Neuro CHIEF COMPLAINT: Code Stroke  History of Present Illness:  56 year old woman who presented to [facility] 5/29 with reported left-sided weakness, slurred speech, nausea/vomiting. LKN 6 hours prior to arrival. PMHx significant for HTN, CHF (Echo 07/2021 EF 60-65%, severe LVH, G2DD), T2DM (c/b diabetic neuropathy with subsequent L great toe amputation 2/2 ulcer/infection), ESRD (on HD MWF via RIJ TDC).  On arrival to ED, patient is somnolent with slight disconjugate gaze with near flaccid left upper arm and left leg weakness. BP 222/107. CT Head with 5cc acute hematoma in the right thalamus with intraventricular extension. Neurology consulted and patient transferred to [facility] for further evaluation. PCCM consulted for admission.  On arrival to unit patient is intubated, pupils 68m bilaterally and non-reactive. Withdrawals in all extremities, does not follow commands, on Propofol and Cleviprex gtt.    Pertinent Medical History:  ESRD with HD MWF, CHF, HTN, DM, s/p left great toe amputation secondary to ulcer/infection   Significant Hospital Events: Including procedures, antibiotic start and stop dates in addition to other pertinent events   [facility] HD, more awake, following commands with RUE/RLE 6/1 No issues overnight, she remains alert and interactive on vent 6/2 tolerated extubation yesterday but poor ineffective cough with copious secretions unable to clear independently and dampened mentation placed patient at hight risk for reintubation   Interim History / Subjective:  She is slightly less interactive this am Unable to verbalize name when this was achievable day prior   Objective:  Blood pressure (!) 126/57, pulse 84, temperature 99.4 F (37.4 C), temperature source  Axillary, resp. rate 18, height '5\' 7"'$  (1.702 m), weight 104.4 kg, SpO2 98 %.    Vent Mode: CPAP;PSV FiO2 (%):  [30 %] 30 % PEEP:  [5 cmH20] 5 cmH20 Pressure Support:  [10 cmH20] 10 cmH20   Intake/Output Summary (Last 24 hours) at 08/27/2021 0746 Last data filed at 08/27/2021 0700 Gross per 24 hour  Intake 1313.02 ml  Output 180 ml  Net 1133.02 ml    Filed Weights   08/23/21 0728 08/24/21 0600  Weight: 104.5 kg 104.4 kg   Physical Examination: General: Acute on chronically ill appearing middle aged  female lying in bed in NAD HEENT: [place]/AT, MM pink/moist, PERRL,  Neuro: Will open eyes spontaneously but unable to follow commands this am  CV: s1s2 regular rate and rhythm, no murmur, rubs, or gallops,  PULM: Snoring respirations auscultated this is resolved with chin tilt, oxygen saturations 98 on 2L [place]  GI: soft, bowel sounds active in all 4 quadrants, non-tender, non-distended Extremities: warm/dry, no edema  Skin: no rashes or lesions  Resolved Hospital Problem List:   Slight Hyperkalemia, K 6.0  Assessment & Plan:   Right Thalamus ICH with IVH and Obstructive Hydrocephalus s/p EVD -Repeat MRI brain 5/31 No change in size of the right thalamic intraparenchymal hemorrhage is suspected, maximal dimension 3.6 cm by MRI.  -HTS stopped 6/1 P: Management per neurology  Maintain neuro protective measures; goal for eurothermia, euglycemia, eunatermia, normoxia, and PCO2 goal of 35-40 Nutrition and bowel regiment  Seizure precautions  Aspirations precautions  Frequent neuro checks  Respiratory Insufficiency in setting of above  Concern for aspiration event  High risk for reintubation  -CXR with pulmonary edema vs. Consolidation. -Tolerated extubation  6/2 P: Remains at high risk due to poor ineffective cough with copious secretions unable to clear independently and dampened mentation  Elevated HOB  Continue supplemental oxygen for sat goal > 92 Assist with management of oral  secretions  Continue empiric antibiotics  Minimize sedating medications   Hypernatremia Cerebral edema P: Continue to trend bmets  EVD per NSGY  HTN Emergency  - Goal SBP < 160 per Stroke P: Remains on Cleviprix, continue to wean  Continue maxed dose Norvasc, hydralazine, and Losartan Increase Labetalol to 300 TID Continuous telemetry   ESRD on HD MWF  P: Nephrology following, appreciate assistance Continued iHD per Nephrology   Follow renal function Monitor urine output Trend Bmet Avoid nephrotoxins Ensure adequate renal perfusion   DM P: Continue SSI  CBG goal 140-180 CBG checks q4hrs   Foot Ulcer, +Pseudomonas on recent culture  -Was recently prescribed Cipro, will change to Zosyn for now to also cover for possible aspiration event P: Remains on empiric Zosyn with stop date in place  Follow  Remains on empiric Zosyn started 5/29 Follow cultures   Best Practice: (right click and "Reselect all SmartList Selections" daily)   Diet/type: NPO DVT prophylaxis: SCD GI prophylaxis: PPI Lines: N/A Foley:  N/A Code Status:  full code Last date of multidisciplinary goals of care discussion [5/29]  Critical care time: 35 minutes   Rania Prothero D. Kenton Kingfisher, NP-C [facility] Personal contact information can be found on Amion  08/27/2021, 7:46 AM

## 2021-08-27 NOTE — Progress Notes (Signed)
SLP Cancellation Note  Patient Details Name: Janet Mitchell MRN: [id number] DOB: 03-05-1966   Cancelled treatment:       Reason Eval/Treat Not Completed: Patient not medically ready Patient with new tracheostomy 6/2 and remains on the vent. Orders for SLP eval and treat for PMSV and swallowing received. Will follow pt closely for readiness for SLP interventions as appropriate.   Zylie Mumaw I. Hardin Negus, [place], [place] Office number [phone] Pager [place] 08/27/2021, 4:54 PM

## 2021-08-28 DIAGNOSIS — J9601 Acute respiratory failure with hypoxia: Secondary | ICD-10-CM | POA: Diagnosis not present

## 2021-08-28 DIAGNOSIS — I610 Nontraumatic intracerebral hemorrhage in hemisphere, subcortical: Secondary | ICD-10-CM | POA: Diagnosis not present

## 2021-08-28 LAB — BASIC METABOLIC PANEL
Anion gap: 11 (ref 5–15)
BUN: 19 mg/dL (ref 6–20)
CO2: 27 mmol/L (ref 22–32)
Calcium: 8.2 mg/dL — ABNORMAL LOW (ref 8.9–10.3)
Chloride: 102 mmol/L (ref 98–111)
Creatinine, Ser: 4.05 mg/dL — ABNORMAL HIGH (ref 0.44–1.00)
GFR, Estimated: 13 mL/min — ABNORMAL LOW (ref >60)
Glucose, Bld: 111 mg/dL — ABNORMAL HIGH (ref 70–99)
Potassium: 3.1 mmol/L — ABNORMAL LOW (ref 3.5–5.1)
Sodium: 140 mmol/L (ref 135–145)

## 2021-08-28 LAB — MAGNESIUM: Magnesium: 1.7 mg/dL (ref 1.7–2.4)

## 2021-08-28 LAB — GLUCOSE, CAPILLARY
Glucose-Capillary: 106 mg/dL — ABNORMAL HIGH (ref 70–99)
Glucose-Capillary: 118 mg/dL — ABNORMAL HIGH (ref 70–99)
Glucose-Capillary: 150 mg/dL — ABNORMAL HIGH (ref 70–99)
Glucose-Capillary: 170 mg/dL — ABNORMAL HIGH (ref 70–99)
Glucose-Capillary: 183 mg/dL — ABNORMAL HIGH (ref 70–99)
Glucose-Capillary: 185 mg/dL — ABNORMAL HIGH (ref 70–99)

## 2021-08-28 LAB — CBC
HCT: 33.3 % — ABNORMAL LOW (ref 36.0–46.0)
Hemoglobin: 10.2 g/dL — ABNORMAL LOW (ref 12.0–15.0)
MCH: 24.4 pg — ABNORMAL LOW (ref 26.0–34.0)
MCHC: 30.6 g/dL (ref 30.0–36.0)
MCV: 79.7 fL — ABNORMAL LOW (ref 80.0–100.0)
Platelets: 89 10*3/uL — ABNORMAL LOW (ref 150–400)
RBC: 4.18 MIL/uL (ref 3.87–5.11)
RDW: 17 % — ABNORMAL HIGH (ref 11.5–15.5)
WBC: 6.3 10*3/uL (ref 4.0–10.5)
nRBC: 0 % (ref 0.0–0.2)

## 2021-08-28 LAB — PHOSPHORUS: Phosphorus: 4 mg/dL (ref 2.5–4.6)

## 2021-08-28 LAB — TRIGLYCERIDES: Triglycerides: 174 mg/dL — ABNORMAL HIGH (ref <150)

## 2021-08-28 MED ORDER — CLONIDINE HCL 0.1 MG PO TABS
0.1000 mg | ORAL_TABLET | Freq: Every day | ORAL | Status: DC
Start: 2021-08-28 — End: 2021-08-28

## 2021-08-28 MED ORDER — POTASSIUM CHLORIDE 20 MEQ PO PACK
40.0000 meq | PACK | Freq: Once | ORAL | Status: AC
  Administered 2021-08-28: 40 meq
  Filled 2021-08-28: qty 2

## 2021-08-28 MED ORDER — CLONIDINE HCL 0.2 MG PO TABS: 0.2000 mg | ORAL_TABLET | Freq: Three times a day (TID) | ORAL | Status: DC

## 2021-08-28 MED ORDER — CLONIDINE HCL 0.1 MG PO TABS
0.1000 mg | ORAL_TABLET | Freq: Every day | ORAL | Status: DC
  Administered 2021-08-28: 0.1 mg
  Filled 2021-08-28: qty 1

## 2021-08-28 MED ORDER — ORAL CARE MOUTH RINSE
15.0000 mL | Freq: Two times a day (BID) | OROMUCOSAL | Status: DC
Start: 2021-08-29 — End: 2021-09-19
  Administered 2021-08-29 – 2021-09-18 (×37): 15 mL via OROMUCOSAL

## 2021-08-28 MED ORDER — CHLORHEXIDINE GLUCONATE CLOTH 2 % EX PADS
6.0000 | MEDICATED_PAD | Freq: Every day | CUTANEOUS | Status: DC
  Administered 2021-08-28 – 2021-09-10 (×9): 6 via TOPICAL

## 2021-08-28 MED ORDER — AMLODIPINE BESYLATE 10 MG PO TABS: 10.0000 mg | ORAL_TABLET | Freq: Every day | ORAL | Status: DC

## 2021-08-28 MED ORDER — FENTANYL CITRATE PF 50 MCG/ML IJ SOSY
25.0000 ug | PREFILLED_SYRINGE | INTRAMUSCULAR | Status: DC | PRN
  Administered 2021-08-28 – 2021-09-23 (×8): 25 ug via INTRAVENOUS
  Filled 2021-08-28 (×9): qty 1

## 2021-08-28 MED ORDER — NIFEDIPINE ER OSMOTIC RELEASE 60 MG PO TB24
60.0000 mg | ORAL_TABLET | Freq: Every day | ORAL | Status: DC
Start: 2021-08-28 — End: 2021-08-28
  Filled 2021-08-28: qty 1

## 2021-08-28 MED ORDER — CHLORHEXIDINE GLUCONATE 0.12 % MT SOLN
15.0000 mL | Freq: Two times a day (BID) | OROMUCOSAL | Status: DC
  Administered 2021-08-28 – 2021-10-05 (×72): 15 mL via OROMUCOSAL
  Filled 2021-08-28 (×48): qty 15

## 2021-08-28 NOTE — Progress Notes (Signed)
Received patient in bed, alert and oriented. Informed consent signed and in chart.  Time tx initiated: 20:21  Pre HD weight:104.1 kg  Pre HD VS: 145/74, 67, 16, 98.4  Time tx completed:  HD treatment completed. Patient tolerated well. Fistula without signs and symptoms of complications.  Alert, no acute distress. Report given to Simonne Maffucci, RN RN.  Total UF removed: 00:26  Medication given: none  Post HD VS: 124/67, 70, 16, 100%  Post HD weight: 101.4 kg

## 2021-08-28 NOTE — Progress Notes (Signed)
Received patient in bed, alert and oriented. Informed consent signed and in chart.  Time tx initiated:  Pre HD weight:106.2 kg  Pre HD VS:  Time tx completed:19:55  HD treatment completed. Patient tolerated well. Fistula without signs and symptoms of complications. Patient is in ICU, alert, nonverbal,  in no acute distress. Report given to Simonne Maffucci, RNRN.  Total UF removed:3.5 Liters  Medication given:none  Post HD VS:146/95, 68, 16, 100%  Post HD weight: 102.7 kg

## 2021-08-28 NOTE — Progress Notes (Signed)
Patient ID: Janet Mitchell, female   DOB: 07/19/1965, 56 y.o.   MRN: [id number] Patient remains somnolent obtunded will not follow commands for me this morning  (Paraschos EVD site clean drain with 110 cc a shift output  Continue EVD for now right thalamic hemorrhage with midbrain extension continue supportive care

## 2021-08-28 NOTE — Progress Notes (Signed)
NAME:  Janet Mitchell, MRN:  [id number], DOB:  [birth year], LOS: 5 ADMISSION DATE:  08/23/2021, CONSULTATION DATE:  08/23/2021 REFERRING MD:  Dr. Rory Percy - Neuro CHIEF COMPLAINT: Code Stroke  History of Present Illness:  56 year old woman who presented to [facility] 5/29 with reported left-sided weakness, slurred speech, nausea/vomiting. LKN 6 hours prior to arrival. PMHx significant for HTN, CHF (Echo 07/2021 EF 60-65%, severe LVH, G2DD), T2DM (c/b diabetic neuropathy with subsequent L great toe amputation 2/2 ulcer/infection), ESRD (on HD MWF via RIJ TDC).  On arrival to ED, patient is somnolent with slight disconjugate gaze with near flaccid left upper arm and left leg weakness. BP 222/107. CT Head with 5cc acute hematoma in the right thalamus with intraventricular extension. Neurology consulted and patient transferred to [facility] for further evaluation. PCCM consulted for admission.  On arrival to unit patient is intubated, pupils 54m bilaterally and non-reactive. Withdrawals in all extremities, does not follow commands, on Propofol and Cleviprex gtt.    Pertinent Medical History:  ESRD with HD MWF, CHF, HTN, DM, s/p left great toe amputation secondary to ulcer/infection   Significant Hospital Events: Including procedures, antibiotic start and stop dates in addition to other pertinent events   [facility]28 [facility]. [facility] > HD, more awake, following commands with RUE/RLE 6/1 No issues overnight, she remains alert and interactive on vent.  Extubated 6/2 failed extubation requiring reintubation with eventual establishment of tracheostomy by afternoon  Interim History / Subjective:  Slightly sedated on ventilator through tracheostomy on low-dose Precedex drip  Objective:  Blood pressure 134/66, pulse 71, temperature 98.4 F (36.9 C), temperature source Axillary, resp. rate 17, height '5\' 7"'$  (1.702 m), weight 106.2 kg, SpO2 100 %.    Vent Mode: PRVC FiO2  (%):  [30 %-100 %] 30 % Set Rate:  [16 bmp] 16 bmp Vt Set:  [490 mL] 490 mL PEEP:  [5 cmH20] 5 cmH20 Plateau Pressure:  [16 cmH20-18 cmH20] 16 cmH20   Intake/Output Summary (Last 24 hours) at 08/28/2021 09735Last data filed at 08/28/2021 0700 Gross per 24 hour  Intake 2018.05 ml  Output 3233 ml  Net -1214.95 ml    Filed Weights   08/23/21 0728 08/24/21 0600 08/28/21 0500  Weight: 104.5 kg 104.4 kg 106.2 kg   Physical Examination: General: Acute on chronic ill-appearing middle-aged female lying in bed on mechanical ventilation in no acute distress HEENT: 6 cuffed [place] trach midline, MM pink/moist, PERRL,  Neuro: Slightly sedated on Precedex drip but will arouse to physical stimuli, unable to follow commands CV: s1s2 regular rate and rhythm, no murmur, rubs, or gallops,  PULM: Clear to auscultation bilaterally, no increased work of breathing, no added breath sounds GI: soft, bowel sounds active in all 4 quadrants, non-tender, non-distended, tolerating TF Extremities: warm/dry, generalized edema  Skin: no rashes or lesions  Resolved Hospital Problem List:   Slight Hyperkalemia, K 6.0  Assessment & Plan:   Right Thalamus ICH with IVH and Obstructive Hydrocephalus s/p EVD -Repeat MRI brain 5/31 No change in size of the right thalamic intraparenchymal hemorrhage is suspected, maximal dimension 3.6 cm by MRI.  -HTS stopped 6/1 P: Management per neurology  Maintain neuro protective measures; goal for eurothermia, euglycemia, eunatermia, normoxia, and PCO2 goal of 35-40 Nutrition and bowel regiment  Seizure precautions  Aspirations precautions  Frequent neurochecks EVD per neurosurgery  Respiratory Insufficiency in setting of above  Concern for aspiration event  High risk for reintubation  -  CXR with pulmonary edema vs. Consolidation. -Tolerated extubation 6/1 but failed 5 6/2 requiring intubation and establishment of tracheostomy P: Continue ventilator support with lung  protective strategies  Wean PEEP and FiO2 for sats greater than 90%. Head of bed elevated 30 degrees. Plateau pressures less than 30 cm H20.  Follow intermittent chest x-ray and ABG.   Follow cultures  VAP bundle in place  PAD protocol Minimize sedation  S/p 5 days of IV Zosyn  Hypernatremia Cerebral edema P: Continue to trend BMPs  HTN Emergency  - Goal SBP < 160 per Stroke P: Continue max dose Norvasc, hydralazine, losartan, and labetalol And as needed clonidine Continue Cleviprex drip for BP goal less than 160 Continuous telemetry   ESRD on HD MWF  P: Nephrology following, appreciate assistance Follow renal function IHD per nephrology Trend Bmet Avoid nephrotoxins Ensure adequate renal perfusion   DM P: Continue SSI CBG goal 140-180 CBG checks every 4 hours  Foot Ulcer, +Pseudomonas on recent culture  -Was recently prescribed Cipro, will change to Zosyn for now to also cover for possible aspiration event P: S/p 5 days of IV Zosyn    Best Practice: (right click and "Reselect all SmartList Selections" daily)   Diet/type: NPO DVT prophylaxis: SCD GI prophylaxis: PPI Lines: N/A Foley:  N/A Code Status:  full code Last date of multidisciplinary goals of care discussion [5/29]  Critical care time: 38 minutes   Natividad Halls D. Kenton Kingfisher, NP-C [place] Personal contact information can be found on Amion  08/28/2021, 7:52 AM

## 2021-08-28 NOTE — Progress Notes (Addendum)
[facility] Progress Note  Subjective: pt seen in ICU. Not handling secretions will and will likely have to be reintubated , per RN. Husband at bedside.   Vitals:   08/28/21 0645 08/28/21 0700 08/28/21 0800 08/28/21 0815  BP: (!) 139/59 134/66    Pulse: 71 71    Resp: 17 17    Temp:   99.3 F (37.4 C)   TempSrc:   Axillary   SpO2: 100% 100%  100%  Weight:      Height:        Exam:  Not in distress, eyes closed, not responding   no jvd  Chest cta bilat  Cor reg no RG  Abd soft ntnd no ascites   Ext 1-2+ bilat UE edema, no hip or LE edema   Pt is altered/ lethargic, neuro exam not done   AVF+ bruit       Home meds include - asa, tylenol, renavite, cadexomer iodine, phoslo , ceterizine, cholestyramine, hydralazine 10 bid, labetalol 200 bid, losartan 100, psyllium        OP HD: [facility] MWF  4h  97.5kg  450/500  2/2.5 bath  15ga  AVF  Hep none - mircera 100 ug q2 - rocaltrol 1.0 mcg po tiw     Assessment/ Plan: R thalamus ICH - w/ IVH and hydrocephalus. Drain placed per neurosurgery 5/29. SP 3% saline, dc'd now.  VDRF - sp trach, on the vent  HTN'sive emergency - uncontrolled HTN, better. Cont labetalol/ hydralazine/ losartan/ norvasc per tube. Also cleviprex gtt.  Vol - bilat UE edema, no other edema. Got 3 L off  yesterday. Wt's remain up 8-9kg. CXR bilat infiltrates, will need serial HD to get volume down.  ESRD - on HD MWF. Serial HD due to vol overload. HD today/ tonight.   Atrial fib - on BB, a/c's on hold Anemia esrd - Hb 11- 13, no esa needs MBD ckd - CCa and phos in range. No need for binder unless eating bolus meals. Cont po vdra per tube.  Nutrition - getting TF's       Janet Mitchell 08/25/2021, 9:04 AM   Recent Labs  Lab 08/23/21 [phone] 08/23/21 0640 08/26/21 0423 08/27/21 0359 08/27/21 0840 08/27/21 1123 08/28/21 0435  HGB 10.9*   < > 11.0*   < >  --  10.9* 10.2*  ALBUMIN 3.8  --   --   --   --   --   --   CALCIUM 8.5*    < > 8.4*  --  7.9*  --  8.2*  PHOS  --    < > 3.1  --   --   --  4.0  CREATININE 9.25*   < > 3.91*  --  6.41*  --  4.05*  K 5.2*   < > 3.5   < > 4.0 3.7 3.1*   < > = values in this interval not displayed.    Inpatient medications:  amLODipine  10 mg Per Tube Daily   calcitRIOL  1 mcg Per Tube Q M,W,F   chlorhexidine gluconate (MEDLINE KIT)  15 mL Mouth Rinse BID   Chlorhexidine Gluconate Cloth  6 each Topical Q0600   cloNIDine  0.1 mg Per Tube Daily   fentaNYL (SUBLIMAZE) injection  200 mcg Intravenous Once   heparin injection (subcutaneous)  5,000 Units Subcutaneous Q8H   hydrALAZINE  100 mg Per Tube Q8H   insulin aspart  0-6 Units Subcutaneous Q4H   labetalol  300 mg Per Tube Q8H   losartan  100 mg Per Tube Daily   mouth rinse  15 mL Mouth Rinse 10 times per day   midazolam  5 mg Intravenous Once   multivitamin  1 tablet Per Tube QHS   pantoprazole sodium  40 mg Per Tube Daily   senna-docusate  1 tablet Per Tube BID    clevidipine 11 mg/hr (08/28/21 0817)   dexmedetomidine (PRECEDEX) IV infusion 0.6 mcg/kg/hr (08/28/21 0309)   feeding supplement (VITAL AF 1.2 CAL) 50 mL/hr at 08/28/21 0000   acetaminophen **OR** acetaminophen (TYLENOL) oral liquid 160 mg/5 mL **OR** acetaminophen, diphenhydrAMINE, fentaNYL (SUBLIMAZE) injection, ipratropium-albuterol, labetalol, ondansetron (ZOFRAN) IV, polyvinyl alcohol

## 2021-08-28 NOTE — Plan of Care (Signed)
  Problem: Intracerebral Hemorrhage Tissue Perfusion: Goal: Complications of Intracerebral Hemorrhage will be minimized Outcome: Progressing   Problem: Clinical Measurements: Goal: Will remain free from infection Outcome: Progressing Goal: Diagnostic test results will improve Outcome: Progressing Goal: Respiratory complications will improve Outcome: Progressing   Problem: Nutrition: Goal: Adequate nutrition will be maintained Outcome: Progressing

## 2021-08-28 NOTE — Progress Notes (Addendum)
STROKE TEAM PROGRESS NOTE   INTERVAL HISTORY Tracheostomy placed yesterday. Remains on precedex, but following commands on the right side with stimulation. Trama team consulted for PEG tube placement. Vital signs are stable.  Neurological exam is unchanged.  Renal team plan hemodialysis tonight.  Ventriculostomy continues to drain well at 110 cc per shift.  She remains on ventilatory support but is being weaned. Vitals:   08/28/21 0700 08/28/21 0800 08/28/21 0815 08/28/21 0900  BP: 134/66 (!) 127/59 136/61 (!) 128/57  Pulse: 71 70 70 70  Resp: '17 16 16 16  '$ Temp:  99.3 F (37.4 C)    TempSrc:  Axillary    SpO2: 100% 100% 100% 100%  Weight:      Height:       CBC:  Recent Labs  Lab 08/23/21 0632 08/23/21 0640 08/26/21 0423 08/27/21 0359 08/27/21 1123 08/28/21 0435  WBC 4.0   < > 5.6  --   --  6.3  NEUTROABS 2.2  --   --   --   --   --   HGB 10.9*   < > 11.0*   < > 10.9* 10.2*  HCT 35.8*   < > 34.7*   < > 32.0* 33.3*  MCV 81.0   < > 79.8*  --   --  79.7*  PLT 90*   < > 96*  --   --  89*   < > = values in this interval not displayed.    Basic Metabolic Panel:  Recent Labs  Lab 08/26/21 0423 08/26/21 0819 08/27/21 0840 08/27/21 1123 08/28/21 0435  NA 147*   < > 151* 150* 140  K 3.5   < > 4.0 3.7 3.1*  CL 111  --  114*  --  102  CO2 27  --  26  --  27  GLUCOSE 108*  --  89  --  111*  BUN 21*  --  32*  --  19  CREATININE 3.91*  --  6.41*  --  4.05*  CALCIUM 8.4*  --  7.9*  --  8.2*  MG 1.9  --   --   --  1.7  PHOS 3.1  --   --   --  4.0   < > = values in this interval not displayed.    Lipid Panel:  Recent Labs  Lab 08/23/21 1020 08/24/21 1329 08/28/21 0435  CHOL 178  --   --   TRIG 383*   < > 174*  HDL 63  --   --   CHOLHDL 2.8  --   --   VLDL 77*  --   --   LDLCALC 38  --   --    < > = values in this interval not displayed.    HgbA1c:  Recent Labs  Lab 08/23/21 1020  HGBA1C 6.2*    Urine Drug Screen: No results for input(s): LABOPIA,  COCAINSCRNUR, LABBENZ, AMPHETMU, THCU, LABBARB in the last 168 hours.  Alcohol Level  Recent Labs  Lab 08/23/21 0632  ETH <10     IMAGING past 24 hours DG Chest Port 1 View  Result Date: 08/27/2021 CLINICAL DATA:  Post tracheostomy placement. EXAM: PORTABLE CHEST 1 VIEW COMPARISON:  Radiograph earlier today. FINDINGS: New tracheostomy tube with tip overlying the tracheal air column at the thoracic inlet. Enteric tube with tip below the diaphragm not included in the field of view. Stable cardiomegaly. Unchanged mediastinal contours. Persistent low lung volumes. Heterogeneous bilateral lung opacities  which may represent pulmonary edema or multifocal infection, without significant interval change. No pneumothorax or evident pneumomediastinum. IMPRESSION: 1. New tracheostomy tube with tip overlying the tracheal air column at the thoracic inlet. 2. Heterogeneous bilateral lung opacities which may represent pulmonary edema or multifocal infection, without significant interval change. Electronically Signed   By: Keith Rake M.D.   On: 08/27/2021 17:03   DG Abd Portable 1V  Result Date: 08/27/2021 CLINICAL DATA:  56 year old female status post feeding tube placement. EXAM: PORTABLE ABDOMEN - 1 VIEW COMPARISON:  Abdominal radiograph 08/23/2021. FINDINGS: Previously noted nasogastric tube appears to have been removed. New feeding tube noted with tip projecting over the expected location of the antral pre-pyloric region of the stomach. Surgical clips project over the right upper quadrant of the abdomen. Visualized bowel gas pattern is nonobstructive. IMPRESSION: 1. Tip of feeding tube is likely in the antral pre-pyloric region of the stomach. Electronically Signed   By: Vinnie Langton M.D.   On: 08/27/2021 11:58    PHYSICAL EXAM  Physical Exam  Constitutional: Appears well-developed and well-nourished middle-age African-American lady.  Cardiovascular: Normal rate and regular rhythm.  Respiratory:  Effort normal, non-labored breathing, on vent with weaning  Neuro - intubated on precedex, eyes open with mild left ptosis, able to follow simple commands. With eye opening, eyes in right gaze preference position, barely cross midline, mild skew position with left eye hypotropic.  Blinking to visual threat on the right but not on the left, PERRL. Corneal reflex present, gag and cough present. Breathing over the vent.  Facial symmetry not able to test due to ET tube.  Tongue protrusion not cooperative.  Right side upper and lower extremity spontaneous movement, left upper and lower extremity hemiplegia with mild withdraw on the LLE with pain. Sensation, coordination not cooperative and gait not tested.   ASSESSMENT/PLAN [name] is a 56 y.o. female with history of ESRD on HD MWF, CHF, HTN, DM presenting with nausea, vomiting, left side weakness present upon waking. Intubated emergently at [facility] and transferred to [facility] on 5/59.  NSGY consulted, EVD placement 5/29. MRI shows stable size in right thalamic hemorrhage with edema extending to the pons and midbrain. EVD still in place. Extubated 08/26/2021. Reintubated 08/27/2021 and then tracheostomy placed. Consult for PEG tube sent to trauma team.   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 Small volume intraventricular extension of hemorrhage. Mild hydrocephalus. 5/29 Head CT 2200- decrease in size of lateral and third ventricles s/p EVD placement  CTA head & neck No arterial lesion underlying the right thalamic hematoma. Equivocal for spot sign which would be an adverse finding suggesting continued growth MRI  no change in size of the right  thalamic intraparenchymal hemorrhage extending to midbrain, maximal dimension 3.6 cm by MRI. Surrounding edema, likely slightly more prominent. 2D Echo EF 60-65%, consistent with grade II diastolic dysfunction EEG - mild diffuse encephalopathy LDL 38 HgbA1c 6.2 VTE prophylaxis - SCDs aspirin 81 mg daily prior to admission, now on No antithrombotic.  Therapy recommendations:  Pending Disposition:  Pending  Obstructive Hydrocephalus NSGY on board 5/29- EVD placement  Follow up CT shows slightly decrease in size of  lateral and third ventricle  Plan for increase level and attempt clamping over the weekend.   Cerebral edema On 3% saline @ 75- d/c today Given ESRD, try to avoid fluid overload, will d/c 3% saline Na goal 150-155 Na 142->146->141->147 -> 150  Acute Hypoxic Respiratory failure Intubated 5/29 CCM managing vent Extubated 6/1 Reintubated and s/p trach 6/2  Hypertensive emergency Home meds:  labetolol, hydralazine, losartan Stable BP less than 160 On losartan 100, labetalol 200 3 times daily, hydralazine 50 3 times daily Add amlodipine 10 Long-term BP goal normotensive Still on cleviprex, taper off as able  End Stage Renal Failure on hemodialysis MWF Nephrology consulted Dialysis done 5/30 Monitor BMP/electrolytes  Diabetes Mellitus A1c 6.2, controlled CBGs SSI Close PCP follow-up  Other Stroke Risk Factors Obesity, Body mass index is 36.67 kg/m., BMI >/= 30 associated with increased stroke risk, recommend weight loss, diet and exercise as appropriate  Congestive heart failure  Other Active Problems Hyperkalemia  K 6.1->4.2-> 3.5, s/p HD and Lokelma Dysphagia - cortrak placed on [facility] day # 5  Patient seen and examined by NP/APP with MD. MD to update note as needed.   Janine Ores, DNP, FNP-BC [facility] Pager: [phone]  I have personally obtained history,examined this patient, reviewed notes, independently viewed imaging  studies, participated in medical decision making and plan of care.ROS completed by me personally and pertinent positives fully documented  I have made any additions or clarifications directly to the above note. Agree with note above.  Continue ventilatory support as per critical care team and wean off ventilator as tolerated hopefully over the next few days.  Patient will unlikely be able to swallow anytime soon safely hence will consult trauma team for PEG tube placement next week.  Continue strict blood pressure control with systolic goal below 536.  Continue dialysis as per renal team.  No family available at the bedside.This patient is critically ill and at significant risk of neurological worsening, death and care requires constant monitoring of vital signs, hemodynamics,respiratory and cardiac monitoring, extensive review of multiple databases, frequent neurological assessment, discussion with family, other specialists and medical decision making of high complexity.I have made any additions or clarifications directly to the above note.This critical care time does not reflect procedure time, or teaching time or supervisory time of PA/NP/Med Resident etc but could involve care discussion time.  I spent 30 minutes of neurocritical care time  in the care of  this patient.      Antony Contras, MD Medical Director [place] Pager: [phone] 08/28/2021 12:43 PM     To contact Stroke Continuity provider, please refer to [email or web address] After hours, contact General Neurology

## 2021-08-29 DIAGNOSIS — I629 Nontraumatic intracranial hemorrhage, unspecified: Secondary | ICD-10-CM | POA: Diagnosis not present

## 2021-08-29 DIAGNOSIS — I610 Nontraumatic intracerebral hemorrhage in hemisphere, subcortical: Secondary | ICD-10-CM | POA: Diagnosis not present

## 2021-08-29 DIAGNOSIS — J9601 Acute respiratory failure with hypoxia: Secondary | ICD-10-CM | POA: Diagnosis not present

## 2021-08-29 LAB — GLUCOSE, CAPILLARY
Glucose-Capillary: 149 mg/dL — ABNORMAL HIGH (ref 70–99)
Glucose-Capillary: 142 mg/dL — ABNORMAL HIGH (ref 70–99)
Glucose-Capillary: 187 mg/dL — ABNORMAL HIGH (ref 70–99)
Glucose-Capillary: 150 mg/dL — ABNORMAL HIGH (ref 70–99)
Glucose-Capillary: 209 mg/dL — ABNORMAL HIGH (ref 70–99)

## 2021-08-29 LAB — MAGNESIUM: Magnesium: 1.7 mg/dL (ref 1.7–2.4)

## 2021-08-29 LAB — BASIC METABOLIC PANEL
Anion gap: 10 (ref 5–15)
BUN: 18 mg/dL (ref 6–20)
CO2: 28 mmol/L (ref 22–32)
Calcium: 8.3 mg/dL — ABNORMAL LOW (ref 8.9–10.3)
Chloride: 99 mmol/L (ref 98–111)
Creatinine, Ser: 3.65 mg/dL — ABNORMAL HIGH (ref 0.44–1.00)
GFR, Estimated: 14 mL/min — ABNORMAL LOW (ref >60)
Glucose, Bld: 151 mg/dL — ABNORMAL HIGH (ref 70–99)
Potassium: 3.7 mmol/L (ref 3.5–5.1)
Sodium: 137 mmol/L (ref 135–145)

## 2021-08-29 LAB — PHOSPHORUS: Phosphorus: 3.5 mg/dL (ref 2.5–4.6)

## 2021-08-29 MED ORDER — LABETALOL HCL 100 MG PO TABS
200.0000 mg | ORAL_TABLET | Freq: Three times a day (TID) | ORAL | Status: DC
Start: 2021-08-29 — End: 2021-09-06
  Administered 2021-08-29 – 2021-09-06 (×13): 200 mg
  Filled 2021-08-29 (×15): qty 2

## 2021-08-29 MED ORDER — CLONIDINE HCL 0.1 MG PO TABS
0.1000 mg | ORAL_TABLET | Freq: Three times a day (TID) | ORAL | Status: DC
  Administered 2021-08-29 – 2021-09-01 (×7): 0.1 mg
  Filled 2021-08-29 (×9): qty 1

## 2021-08-29 MED ORDER — CHLORHEXIDINE GLUCONATE CLOTH 2 % EX PADS: 6.0000 | MEDICATED_PAD | Freq: Every day | CUTANEOUS | Status: DC

## 2021-08-29 NOTE — Progress Notes (Signed)
NAME:  Janet Mitchell, MRN:  [id number], DOB:  1965-10-20, LOS: 6 ADMISSION DATE:  08/23/2021, CONSULTATION DATE:  08/23/2021 REFERRING MD:  Dr. Rory Percy - Neuro CHIEF COMPLAINT: Code Stroke  History of Present Illness:  56 year old woman who presented to [facility] 5/29 with reported left-sided weakness, slurred speech, nausea/vomiting. LKN 6 hours prior to arrival. PMHx significant for HTN, CHF (Echo 07/2021 EF 60-65%, severe LVH, G2DD), T2DM (c/b diabetic neuropathy with subsequent L great toe amputation 2/2 ulcer/infection), ESRD (on HD MWF via [facility]).  On arrival to ED, patient is somnolent with slight disconjugate gaze with near flaccid left upper arm and left leg weakness. BP 222/107. CT Head with 5cc acute hematoma in the right thalamus with intraventricular extension. Neurology consulted and patient transferred to [facility] for further evaluation. PCCM consulted for admission.  On arrival to unit patient is intubated, pupils 31m bilaterally and non-reactive. Withdrawals in all extremities, does not follow commands, on Propofol and Cleviprex gtt.    Pertinent Medical History:  ESRD with HD MWF, CHF, HTN, DM, s/p left great toe amputation secondary to ulcer/infection   Significant Hospital Events: Including procedures, antibiotic start and stop dates in addition to other pertinent events   [facility]28 [facility]. [facility], more awake, following commands with RUE/RLE 6/1 No issues overnight, she remains alert and interactive on vent.  Extubated 6/2 failed extubation requiring reintubation with eventual establishment of tracheostomy by afternoon 6/4 Paced on ATC yesterday afternoon and has tolerated well since  Interim History / Subjective:  Awakes to verbal stimuli and continues to follow commands on right side   Objective:  Blood pressure 104/65, pulse 76, temperature 99.2 F (37.3 C), temperature source Oral, resp. rate (!) 21, height '5\' 7"'$   (1.702 m), weight 104.1 kg, SpO2 100 %.    Vent Mode: PRVC FiO2 (%):  [28 %-40 %] 28 % Set Rate:  [16 bmp] 16 bmp Vt Set:  [490 mL] 490 mL PEEP:  [5 cmH20] 5 cmH20   Intake/Output Summary (Last 24 hours) at 08/29/2021 0710 Last data filed at 08/29/2021 0600 Gross per 24 hour  Intake 1714.15 ml  Output 4006 ml  Net -2291.85 ml    Filed Weights   08/28/21 0500 08/28/21 1612 08/29/21 0500  Weight: 106.2 kg 106.2 kg 104.1 kg   Physical Examination: General: Acute on chronically ill appearing middle aged female now on ATC, in NAD HEENT: 6 cuffed trach midline, MM pink/moist, PERRL,  Neuro: Alert and interactive on vent, able to follow commands on right CV: s1s2 regular rate and rhythm, no murmur, rubs, or gallops,  PULM: Tracheal secretions leading to upper airway rhonchi, no increased work of breathing, no added breath sounds GI: soft, bowel sounds active in all 4 quadrants, non-tender, non-distended, tolerating TF Extremities: warm/dry, no edema  Skin: no rashes or lesions  Resolved Hospital Problem List:   Slight Hyperkalemia, K 6.0 Hypernatremia   Assessment & Plan:   Right Thalamus ICH with IVH and Obstructive Hydrocephalus s/p EVD -Repeat MRI brain 5/31 No change in size of the right thalamic intraparenchymal hemorrhage is suspected, maximal dimension 3.6 cm by MRI.  -HTS stopped 6/1 P: Management per neurology  Maintain neuro protective measures Nutrition and bowel regiment  Seizure precautions  Aspirations precautions  EVD per NSGY  Respiratory Insufficiency in setting of above  Concern for aspiration event  High risk for reintubation  -CXR with pulmonary edema vs. Consolidation. -Tolerated extubation  6/1 but failed 5 6/2 requiring intubation and establishment of tracheostomy -S/p 5 days of IV Zosyn P: Place on ATC am of 6/3 Routine trach care  Head of bed elevated 30 degrees Follow intermittent chest x-ray and ABG Ensure adequate pulmonary hygiene  Follow  cultures  Minimize sedation  HTN Emergency  - Goal SBP < 160 per Stroke P: Continue max dose Norvasc, Hydralazine, Losartan, and Labetalol Low dose Clonidine started 6/3 continue  Clevipirex drip stopped overnight 6/3 Continuous telemetry   ESRD on HD MWF  P: Follow renal function  Monitor urine output Trend Bmet Avoid nephrotoxins Ensure adequate renal perfusion   DM P: Continuous telemetry  CBG goal 140-180 CBG checks q4hrs  Foot Ulcer, +Pseudomonas on recent culture  -Was recently prescribed Cipro, will change to Zosyn for now to also cover for possible aspiration event P: S/P 5 days of IV Zosyn   Best Practice: (right click and "Reselect all SmartList Selections" daily)   Diet/type: NPO DVT prophylaxis: SCD GI prophylaxis: PPI Lines: N/A Foley:  N/A Code Status:  full code Last date of multidisciplinary goals of care discussion [5/29]  Critical care time: 37 minutes   Beola Vasallo D. Kenton Kingfisher, NP-C [facility] Personal contact information can be found on Amion  08/29/2021, 7:10 AM

## 2021-08-29 NOTE — Progress Notes (Signed)
Inpatient Rehab Admissions Coordinator Note:   Per PT patient was screened for CIR candidacy by Yenny Kosa Danford Bad, CCC-SLP. At this time, pt has not yet attempted transfers. Pt may have potential to progress to becoming a potential CIR candidate. CIR admissions team will follow to monitor for progress and participation with therapies. A consult order will be placed if pt appears to be an appropriate candidate.   Gayland Curry, [place], [place] Admissions Coordinator [phone] 08/29/21 4:20 PM

## 2021-08-29 NOTE — Progress Notes (Addendum)
STROKE TEAM PROGRESS NOTE   INTERVAL HISTORY Her husband is at the bedside.. Remains on precedex, but following commands on the right side with stimulation. Trauma team consulted for PEG tube placement next week. Vital signs are stable.  Neurological exam is unchanged.  She had dialysis yesterday. Currently on trach collar, tolerating well Plan to raise drain on Monday per NSGY. Ventriculostomy continues to drain well.  Vitals:   08/29/21 1400 08/29/21 1430 08/29/21 1500 08/29/21 1601  BP: (!) 118/43  (!) 112/36   Pulse: 83 80 80 81  Resp: (!) 21 11 (!) 26 (!) 21  Temp:      TempSrc:      SpO2: 98% 98% 97% 100%  Weight:      Height:       CBC:  Recent Labs  Lab 08/23/21 0632 08/23/21 0640 08/26/21 0423 08/27/21 0359 08/27/21 1123 08/28/21 0435  WBC 4.0   < > 5.6  --   --  6.3  NEUTROABS 2.2  --   --   --   --   --   HGB 10.9*   < > 11.0*   < > 10.9* 10.2*  HCT 35.8*   < > 34.7*   < > 32.0* 33.3*  MCV 81.0   < > 79.8*  --   --  79.7*  PLT 90*   < > 96*  --   --  89*   < > = values in this interval not displayed.   Basic Metabolic Panel:  Recent Labs  Lab 08/28/21 0435 08/29/21 0514  NA 140 137  K 3.1* 3.7  CL 102 99  CO2 27 28  GLUCOSE 111* 151*  BUN 19 18  CREATININE 4.05* 3.65*  CALCIUM 8.2* 8.3*  MG 1.7 1.7  PHOS 4.0 3.5   Lipid Panel:  Recent Labs  Lab 08/23/21 1020 08/24/21 1329 08/28/21 0435  CHOL 178  --   --   TRIG 383*   < > 174*  HDL 63  --   --   CHOLHDL 2.8  --   --   VLDL 77*  --   --   LDLCALC 38  --   --    < > = values in this interval not displayed.   HgbA1c:  Recent Labs  Lab 08/23/21 1020  HGBA1C 6.2*   Urine Drug Screen: No results for input(s): LABOPIA, COCAINSCRNUR, LABBENZ, AMPHETMU, THCU, LABBARB in the last 168 hours.  Alcohol Level  Recent Labs  Lab 08/23/21 0632  ETH <10    IMAGING past 24 hours No results found.  PHYSICAL EXAM  Physical Exam  Constitutional: Appears well-developed and well-nourished  middle-age African-American lady.  Cardiovascular: Normal rate and regular rhythm.  Respiratory: Effort normal, non-labored breathing, on vent with weaning  Neuro -s/p tracheostomy on precedex, eyes open with mild left ptosis, able to follow simple commands. With eye opening, eyes in right gaze preference position, barely cross midline, mild skew position with left eye hypotropic.  Blinking to visual threat on the right but not on the left, PERRL. Corneal reflex present, gag and cough present.  .  Facial symmetry not able to test due to ET tube.  Tongue protrusion not cooperative.  Right side upper and lower extremity spontaneous movement, left upper and lower extremity hemiplegia with mild withdraw on the LLE with pain. Sensation, coordination not cooperative and gait not tested.   ASSESSMENT/PLAN Ms. Clytee Heinrich Pinnix is a 56 y.o. female with history of ESRD on  HD MWF, CHF, HTN, DM presenting with nausea, vomiting, left side weakness present upon waking. Intubated emergently at [facility] and transferred to [facility] on 5/59.  NSGY consulted, EVD placement 5/29. MRI shows stable size in right thalamic hemorrhage with edema extending to the pons and midbrain. EVD still in place. Extubated 08/26/2021. Reintubated 08/27/2021 and then tracheostomy placed. Consult for PEG tube sent to trauma team.   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 Small volume intraventricular extension of hemorrhage. Mild hydrocephalus. 5/29 Head CT 2200- decrease in size of lateral and third ventricles s/p EVD placement  CTA head & neck No arterial lesion underlying the right thalamic hematoma. Equivocal for spot sign which would be an  adverse finding suggesting continued growth MRI  no change in size of the right thalamic intraparenchymal hemorrhage extending to midbrain, maximal dimension 3.6 cm by MRI. Surrounding edema, likely slightly more prominent. 2D Echo EF 60-65%, consistent with grade II diastolic dysfunction EEG - mild diffuse encephalopathy LDL 38 HgbA1c 6.2 VTE prophylaxis - SCDs aspirin 81 mg daily prior to admission, now on No antithrombotic.  Therapy recommendations:  Pending Disposition:  Pending  Obstructive Hydrocephalus NSGY on board 5/29- EVD placement  Follow up CT shows slightly decrease in size of lateral and third ventricle  Plan for increase level and attempt clamping over the weekend.   Cerebral edema On 3% saline @ 75- d/c today Given ESRD, try to avoid fluid overload, will d/c 3% saline Na goal 150-155 Na 142->146->141->147 -> 150  Acute Hypoxic Respiratory failure Intubated 5/29 CCM managing vent Extubated 6/1 Reintubated and s/p trach 6/2  Hypertensive emergency Home meds:  labetolol, hydralazine, losartan Stable BP less than 160 On losartan 100, labetalol 200 3 times daily, hydralazine 50 3 times daily Add amlodipine 10 Long-term BP goal normotensive Still on cleviprex, taper off as able  End Stage Renal Failure on hemodialysis MWF Nephrology consulted Dialysis done 5/30 Monitor BMP/electrolytes  Diabetes Mellitus A1c 6.2, controlled CBGs SSI Close PCP follow-up  Other Stroke Risk Factors Obesity, Body mass index is 35.94 kg/m., BMI >/= 30 associated with increased stroke risk, recommend weight loss, diet and exercise as appropriate  Congestive heart failure  Other Active Problems Hyperkalemia  K 6.1->4.2-> 3.5, s/p HD and Lokelma Dysphagia - cortrak placed on [facility] day # 6  Patient seen and examined by NP/APP with MD. MD to update note as needed.   Janine Ores, DNP, FNP-BC [facility] Pager: [phone]  I have personally  obtained history,examined this patient, reviewed notes, independently viewed imaging studies, participated in medical decision making and plan of care.ROS completed by me personally and pertinent positives fully documented  I have made any additions or clarifications directly to the above note. Agree with note above.  Patient is making slow improvement.  Continue weaning off ventilatory support with trach collar trials.  Continues to control hypertension.  Patient will likely need PEG tube for feeding early next week.  Long discussion with patient's husband at the bedside and answered questions.  Discussed with critical care team.This patient is critically ill and at significant risk of neurological worsening, death and care requires constant monitoring of vital signs,  hemodynamics,respiratory and cardiac monitoring, extensive review of multiple databases, frequent neurological assessment, discussion with family, other specialists and medical decision making of high complexity.I have made any additions or clarifications directly to the above note.This critical care time does not reflect procedure time, or teaching time or supervisory time of PA/NP/Med Resident etc but could involve care discussion time.  I spent 30 minutes of neurocritical care time  in the care of  this patient.      Antony Contras, MD Medical Director [facility] Pager: [phone] 08/29/2021 4:07 PM   To contact Stroke Continuity provider, please refer to [email or web address] After hours, contact General Neurology

## 2021-08-29 NOTE — Plan of Care (Signed)
  Problem: Self-Care: Goal: Ability to communicate needs accurately will improve Outcome: Progressing   

## 2021-08-29 NOTE — Evaluation (Signed)
[facility] Speaking [facility]ve - Evaluation Patient Details  Name: Janet Mitchell MRN: [id number] Date of Birth: Jul 19, 1965  Today's Date: 08/29/2021 Time: 1110-1130 SLP Time Calculation (min) (ACUTE ONLY): 20 min  Past Medical History:  Past Medical History:  Diagnosis Date   Abdominal wall hematoma 12/26/2019   Anemia    Arthritis    CHF (congestive heart failure) ([place])    pt states she has been cleared of heart failure/disease   Chronic cholecystitis with calculus    COVID-19 03/26/2019   COVID-19 virus infection 03/2019   ESRD (end stage renal disease) on dialysis [facility])    Dialysis M-W-F   Headache    "a few/wk" (03/09/2018) - no longer having these   History of blood transfusion 10/2017   "low blood count" (03/09/2018)   Hypertension    Seasonal allergies    Spinal headache    Type II diabetes mellitus ([place])    Past Surgical History:  Past Surgical History:  Procedure Laterality Date   AMPUTATION TOE Left 2013   Great toe   AV FISTULA PLACEMENT Left 07/22/2019   Procedure: LEFT ARM BASILIC ARTERIOVENOUS (AV) FISTULA CREATION;  Surgeon: Rosetta Posner, MD;  Location: [place];  Service: Vascular;  Laterality: Left;   [place] Left 10/17/2019   Procedure: LEFT SECOND STAGE [place];  Surgeon: Rosetta Posner, MD;  Location: [place] OR;  Service: Vascular;  Laterality: Left;   BIOPSY  10/24/2018   Procedure: BIOPSY;  Surgeon: Daneil Dolin, MD;  [facility];  Service: Endoscopy;;  right and left colon   CATARACT EXTRACTION W/ INTRAOCULAR LENS IMPLANT Right    [place]; [place] N/A 03/09/2018   Procedure: LAPAROSCOPIC CHOLECYSTECTOMY WITH INTRAOPERATIVE CHOLANGIOGRAM ERAS PATHWAY;  Surgeon: Donnie Mesa, MD;  Location: [place];  Service: General;  Laterality: N/A;   COLONOSCOPY N/A 10/24/2018   Procedure: COLONOSCOPY;  Surgeon: Daneil Dolin, MD;  [facility];  Service: Endoscopy;  Laterality:  N/A;  1:00pm   EYE SURGERY Left 05/15/2018   Removal of blood in the globe (due to DM)   FLEXIBLE SIGMOIDOSCOPY N/A 11/24/2017   Procedure: FLEXIBLE SIGMOIDOSCOPY;  Surgeon: Daneil Dolin, MD;  [facility];  Service: Endoscopy;  Laterality: N/A;   IR FLUORO GUIDE CV LINE RIGHT  07/17/2019   IR PARACENTESIS  07/09/2019   IR US GUIDE VASC ACCESS RIGHT  07/17/2019   LAPAROSCOPIC CHOLECYSTECTOMY  03/09/2018   PERITONEAL CATHETER INSERTION  2017   POLYPECTOMY  10/24/2018   Procedure: POLYPECTOMY;  Surgeon: Daneil Dolin, MD;  [facility];  Service: Endoscopy;;   TOTAL HIP ARTHROPLASTY Left 1997   HPI:  Patient is a 56 y.o. female with PMH: ESRD on HD MWF, CHF, HTN, DM who presented to [facility] ER as a code stroke with c/o nausea and vomiting upon waking up in the morning along with left sided weakness. She was communicative at [facility] but during stay awaiting transfer to [facility] she required emergent intubation due to inability to protect airway. 6/1 CXR showed Diffuse bilateral lung opacities may be due to pulmonary edema or multifocal pneumonia. MRI brain showed right thalamic intraparenchymal hemorrhage  with surrounding edema. She was extubated on 6/1 and although initially tolerated well, quickly was seen with poor, ineffective cough, copious secretions unable to clear independently but in no obvious distress and oxygen saturations 100% on 4L nasal cannula. She was reintubated on 6/2 to tracheostomy #6  cuffed [place].    Assessment / Plan / Recommendation  Clinical Impression  Patient was lethargic but would open eyes to voice and would nod head when asked if she was feeling ok when PMV placed. She did attempt to verbalize when SLP asked her to state her name, however no intelligible speech was heard. RT had just finished tracheal suction of patient when SLP entered room. She tolerated cuff deflation without any observed changes in vital signs and no observed changes in patient's behavior.  SLP donned PMV a total of 6 times for brief durations (not more than 25 seconds. Patient did not exhibit any change in vitals with PMV donned but did exhibit congested and weak vocalizations when cued. She exhibited expiratory wheezing as well, but this was observed when PMV doffed as well. SLP spoke with spouse regarding purpose of and plan for continued trials of PMV. At this time, SLP recommending trials of PMV with SLP only. SLP Visit Diagnosis: Aphonia (R49.1)    SLP Assessment  Patient needs continued Speech [facility]    Recommendations for follow up therapy are one component of a multi-disciplinary discharge planning process, led by the attending physician.  Recommendations may be updated based on patient status, additional functional criteria and insurance authorization.  Follow Up Recommendations  Acute inpatient rehab (3hours/day)    Assistance Recommended at Discharge Frequent or constant Supervision/Assistance  Functional Status Assessment Patient has had a recent decline in their functional status and demonstrates the ability to make significant improvements in function in a reasonable and predictable amount of time.  Frequency and Duration min 3x week  2 weeks    PMSV Trial Able to redirect subglottic air through upper airway: Yes Able to Attain Phonation: Yes Voice Quality: Low vocal intensity;Other (comment) (congested sounding) Able to Expectorate Secretions: No Level of Secretion Expectoration with PMSV: Not observed Intelligibility: Unable to assess (comment) Respirations During Trial: 20 SpO2 During Trial: 97 % Behavior: Lethargic   Tracheostomy Tube       Vent Dependency  FiO2 (%): 28 %    Cuff Deflation Trial Tolerated Cuff Deflation: Yes Length of Time for Cuff Deflation Trial: 15 minutes Behavior: Quiet;Other (comment) (no observed changes in behavior)         Sonia Baller, MA, CCC-SLP Speech Therapy

## 2021-08-29 NOTE — Progress Notes (Signed)
Patient ID: Janet Mitchell, female   DOB: Dec 24, 1965, 56 y.o.   MRN: [id number] Patient neurologically stable  EVD functioning well 110 cc per shift  Plan per Dr. Marcello Moores is to raise the drain on Monday.

## 2021-08-29 NOTE — Progress Notes (Signed)
[facility] Progress Note  Subjective: pt seen in ICU. Had HD yesterday, good session w/ 3 L off. Wt's down and only 6kg over today.   Vitals:   08/29/21 1200 08/29/21 1234 08/29/21 1300 08/29/21 1400  BP: (!) 102/47  (!) 119/46 (!) 118/43  Pulse: 80 82 82 83  Resp: (!) 23 (!) 21 20 (!) 21  Temp:  99.3 F (37.4 C)    TempSrc:  Oral    SpO2: 97% 97% 99% 98%  Weight:      Height:        Exam:  Not in distress, eyes closed, not responding   no jvd  Chest cta bilat  Cor reg no RG  Abd soft ntnd no ascites   Ext 1-2+ bilat UE edema, no hip or LE edema   Pt is altered/ lethargic, neuro exam not done   AVF+ bruit       Home meds include - asa, tylenol, renavite, cadexomer iodine, phoslo , ceterizine, cholestyramine, hydralazine 10 bid, labetalol 200 bid, losartan 100, psyllium        OP HD: [facility] MWF  4h  97.5kg  450/500  2/2.5 bath  15ga  AVF  Hep none - mircera 100 ug q2 - rocaltrol 1.0 mcg po tiw     Assessment/ Plan: R thalamus ICH - w/ IVH and hydrocephalus. Drain placed per neurosurgery 5/29. SP 3% saline, dc'd now.  VDRF - sp trach, on the vent  HTN'sive emergency - uncontrolled HTN, better. Cont labetalol/ hydralazine/ losartan/ norvasc per tube. Also cleviprex gtt.  Vol excess - bilat UE edema, up 9kg at worst here. SP HD fri and Sat w/ 3 L and 3.5 L off respectively. Only 6kg over dry wt this am. Plan HD tomorrow, may need extra HD Tuesday, will see.  ESRD - on HD MWF. Extra HD Sat. Next HD Monday, cont daily HD until volume under control.  Atrial fib - on BB, a/c's on hold Anemia esrd - Hb 11- 13, no esa needs MBD ckd - CCa and phos in range. No need for binder unless eating bolus meals. Cont po vdra per tube.  Nutrition - getting TF's       Kelly Splinter 08/25/2021, 9:04 AM   Recent Labs  Lab 08/23/21 [phone] 08/23/21 0640 08/27/21 1123 08/28/21 0435 08/29/21 0514  HGB 10.9*   < > 10.9* 10.2*  --   ALBUMIN 3.8  --   --   --    --   CALCIUM 8.5*   < >  --  8.2* 8.3*  PHOS  --    < >  --  4.0 3.5  CREATININE 9.25*   < >  --  4.05* 3.65*  K 5.2*   < > 3.7 3.1* 3.7   < > = values in this interval not displayed.    Inpatient medications:  amLODipine  10 mg Per Tube Daily   calcitRIOL  1 mcg Per Tube Q M,W,F   chlorhexidine  15 mL Mouth Rinse BID   Chlorhexidine Gluconate Cloth  6 each Topical Q0600   Chlorhexidine Gluconate Cloth  6 each Topical Q0600   cloNIDine  0.1 mg Per Tube TID   fentaNYL (SUBLIMAZE) injection  200 mcg Intravenous Once   heparin injection (subcutaneous)  5,000 Units Subcutaneous Q8H   hydrALAZINE  100 mg Per Tube Q8H   insulin aspart  0-6 Units Subcutaneous Q4H   labetalol  200 mg Per Tube Q8H   losartan  100 mg Per Tube Daily   mouth rinse  15 mL Mouth Rinse q12n4p   multivitamin  1 tablet Per Tube QHS   pantoprazole sodium  40 mg Per Tube Daily   senna-docusate  1 tablet Per Tube BID    feeding supplement (VITAL AF 1.2 CAL) 1,000 mL (08/29/21 0746)   acetaminophen **OR** acetaminophen (TYLENOL) oral liquid 160 mg/5 mL **OR** acetaminophen, diphenhydrAMINE, fentaNYL (SUBLIMAZE) injection, ipratropium-albuterol, labetalol, ondansetron (ZOFRAN) IV, polyvinyl alcohol

## 2021-08-29 NOTE — Evaluation (Signed)
Physical Therapy Evaluation Patient Details Name: Janet Mitchell MRN: [id number] DOB: [birth year] Today's Date: 08/29/2021  History of Present Illness  56 y.o. female presents to [facility] hospital as a transfer from [place] on 08/23/2021 with complaints of nausea, vomiting, L weakness. Pt required emergent intubation during transfer from [facility]. CTH reveals R thalamic hemorrhage with IVH. Pt underwent R frontal ventriculostomy on 5/29. Extubated 6/1, reintubated 6/2, tracheostomy 6/2. PMH includes HTN, CHF, MDII, ESRD.  Clinical Impression  Pt presents to PT with deficits in strength, power, functional mobility, balance, endurance, communication. Pt follows simple one-step commands consistently with increased time. Pt often with eyes closed during session but does open when cued to do so. Pt demonstrates no AROM of L side at this time, moving R side freely. Pt will benefit from continued aggressive mobilization in an effort to reduce falls risk and caregiver burden. PT recommends AIR consult at this time, pt will need to continue to demonstrate improvement in activity tolerance to become an ideal candidate.       Recommendations for follow up therapy are one component of a multi-disciplinary discharge planning process, led by the attending physician.  Recommendations may be updated based on patient status, additional functional criteria and insurance authorization.  Follow Up Recommendations Acute inpatient rehab (3hours/day)    Assistance Recommended at Discharge Frequent or constant Supervision/Assistance  Patient can return home with the following  Two people to help with walking and/or transfers;Two people to help with bathing/dressing/bathroom;Assistance with cooking/housework;Assistance with feeding;Direct supervision/assist for medications management;Direct supervision/assist for financial management;Help with stairs or ramp for entrance;Assist for transportation    Equipment  Recommendations Hospital bed;Other (comment);Wheelchair (measurements PT);Wheelchair cushion (measurements PT) (hoyer lift)  Recommendations for Other Services  Rehab consult    Functional Status Assessment Patient has had a recent decline in their functional status and demonstrates the ability to make significant improvements in function in a reasonable and predictable amount of time.     Precautions / Restrictions Precautions Precautions: Fall Precaution Comments: a-line, trach, EVD, cortrack Restrictions Weight Bearing Restrictions: No      Mobility  Bed Mobility Overal bed mobility: Needs Assistance             General bed mobility comments: pt pulls into long sitting from bed in chair position with maxA from PT, utilizing RUE support    Transfers                        Ambulation/Gait                  Stairs            Wheelchair Mobility    Modified Rankin (Stroke Patients Only)       Balance Overall balance assessment: Needs assistance Sitting-balance support: Single extremity supported, Feet supported Sitting balance-Leahy Scale: Zero Sitting balance - Comments: max-totalA to maintain long sitting Postural control: Posterior lean                                   Pertinent Vitals/Pain Pain Assessment Pain Assessment: CPOT Facial Expression: Relaxed, neutral Body Movements: Absence of movements Muscle Tension: Relaxed Compliance with ventilator (intubated pts.): N/A Vocalization (extubated pts.): N/A CPOT Total: 0 Pain Intervention(s): Monitored during session    Home Living Family/patient expects to be discharged to:: Private residence Living Arrangements: Spouse/significant other Available Help at Discharge: Family;Available 24 hours/day  Type of Home: House Home Access: Stairs to enter Entrance Stairs-Rails: Left Entrance Stairs-Number of Steps: 3   Home Layout: One level Home Equipment: Chartered certified accountant (2 wheels);Shower seat;Toilet riser (spouse reports toilet riser is old and may need replacing)      Prior Function Prior Level of Function : Needs assist             Mobility Comments: pt ambulates with a RW, no assistance requirements for mobility. ADLs Comments: Pt bathes, dresses, feeds herself. Requires assistance with more physically demanding IADLs     Hand Dominance   Dominant Hand: Right    Extremity/Trunk Assessment   Upper Extremity Assessment Upper Extremity Assessment: LUE deficits/detail;RUE deficits/detail RUE Deficits / Details: RUE ROM WFL, strength grossly 4-/5 with 3+/5 shoulder flexion LUE Deficits / Details: flaccid LUE, PROM WFL, some flexor spasticity noted with PROM    Lower Extremity Assessment Lower Extremity Assessment: RLE deficits/detail;LLE deficits/detail RLE Deficits / Details: grossly 3+/5, ROM WFL LLE Deficits / Details: LLE is flaccid, no spasticity or clonus noted, PROM WFL    Cervical / Trunk Assessment Cervical / Trunk Assessment: Kyphotic  Communication   Communication: Tracheostomy  Cognition Arousal/Alertness: Lethargic (awakens with stimulation) Behavior During Therapy: Flat affect Overall Cognitive Status: Difficult to assess                                 General Comments: pt follows one-step commands with increased time, difficult to further assess cognition due to impaired communication.        General Comments General comments (skin integrity, edema, etc.): VSS, pt on 5L 28% FiO2 trach collar    Exercises     Assessment/Plan    PT Assessment Patient needs continued PT services  PT Problem List Decreased strength;Decreased activity tolerance;Decreased balance;Decreased mobility;Cardiopulmonary status limiting activity;Impaired tone       PT Treatment Interventions DME instruction;Gait training;Functional mobility training;Therapeutic activities;Therapeutic exercise;Balance  training;Neuromuscular re-education;Cognitive remediation;Patient/family education;Wheelchair mobility training    PT Goals (Current goals can be found in the Care Plan section)  Acute Rehab PT Goals Patient Stated Goal: to improve mobility quality in an effort to return to independence PT Goal Formulation: With patient Time For Goal Achievement: 09/12/21 Potential to Achieve Goals: Fair    Frequency Min 3X/week (will increase frequency if pt progresses well)     Co-evaluation               AM-PAC PT "6 Clicks" Mobility  Outcome Measure Help needed turning from your back to your side while in a flat bed without using bedrails?: Total Help needed moving from lying on your back to sitting on the side of a flat bed without using bedrails?: Total Help needed moving to and from a bed to a chair (including a wheelchair)?: Total Help needed standing up from a chair using your arms (e.g., wheelchair or bedside chair)?: Total Help needed to walk in hospital room?: Total Help needed climbing 3-5 steps with a railing? : Total 6 Click Score: 6    End of Session Equipment Utilized During Treatment: Oxygen Activity Tolerance: Patient tolerated treatment well Patient left: in bed;with call bell/phone within reach;with bed alarm set;with family/visitor present;with restraints reapplied Nurse Communication: Mobility status;Need for lift equipment PT Visit Diagnosis: Other abnormalities of gait and mobility (R26.89);Other symptoms and signs involving the nervous system (R29.898);Muscle weakness (generalized) (M62.81);Hemiplegia and hemiparesis Hemiplegia - Right/Left: Left Hemiplegia - caused by: Nontraumatic  intracerebral hemorrhage    Time: 0737-1062 PT Time Calculation (min) (ACUTE ONLY): 22 min   Charges:   PT Evaluation $PT Eval Moderate Complexity: 1 Mod          Zenaida Niece, PT, DPT Acute Rehabilitation Office [place] 08/29/2021, 3:47 PM

## 2021-08-30 ENCOUNTER — Telehealth: Payer: Self-pay

## 2021-08-30 DIAGNOSIS — I629 Nontraumatic intracranial hemorrhage, unspecified: Secondary | ICD-10-CM | POA: Diagnosis not present

## 2021-08-30 DIAGNOSIS — J9601 Acute respiratory failure with hypoxia: Secondary | ICD-10-CM | POA: Diagnosis not present

## 2021-08-30 LAB — BASIC METABOLIC PANEL
Anion gap: 13 (ref 5–15)
BUN: 33 mg/dL — ABNORMAL HIGH (ref 6–20)
CO2: 25 mmol/L (ref 22–32)
Calcium: 8.4 mg/dL — ABNORMAL LOW (ref 8.9–10.3)
Chloride: 98 mmol/L (ref 98–111)
Creatinine, Ser: 5.3 mg/dL — ABNORMAL HIGH (ref 0.44–1.00)
GFR, Estimated: 9 mL/min — ABNORMAL LOW (ref >60)
Glucose, Bld: 148 mg/dL — ABNORMAL HIGH (ref 70–99)
Potassium: 4 mmol/L (ref 3.5–5.1)
Sodium: 136 mmol/L (ref 135–145)

## 2021-08-30 LAB — PHOSPHORUS: Phosphorus: 4.8 mg/dL — ABNORMAL HIGH (ref 2.5–4.6)

## 2021-08-30 LAB — GLUCOSE, CAPILLARY
Glucose-Capillary: 143 mg/dL — ABNORMAL HIGH (ref 70–99)
Glucose-Capillary: 149 mg/dL — ABNORMAL HIGH (ref 70–99)
Glucose-Capillary: 154 mg/dL — ABNORMAL HIGH (ref 70–99)
Glucose-Capillary: 158 mg/dL — ABNORMAL HIGH (ref 70–99)
Glucose-Capillary: 164 mg/dL — ABNORMAL HIGH (ref 70–99)
Glucose-Capillary: 164 mg/dL — ABNORMAL HIGH (ref 70–99)
Glucose-Capillary: 176 mg/dL — ABNORMAL HIGH (ref 70–99)

## 2021-08-30 LAB — MAGNESIUM: Magnesium: 1.8 mg/dL (ref 1.7–2.4)

## 2021-08-30 MED ORDER — AMANTADINE HCL 50 MG/5ML PO SOLN: 100.0000 mg | Freq: Two times a day (BID) | ORAL | Status: DC

## 2021-08-30 MED ORDER — AMANTADINE HCL 100 MG PO CAPS
200.0000 mg | ORAL_CAPSULE | ORAL | Status: DC
  Filled 2021-08-30: qty 2

## 2021-08-30 MED ORDER — NUTRISOURCE FIBER PO PACK
1.0000 | PACK | Freq: Two times a day (BID) | ORAL | Status: DC
  Administered 2021-08-30 – 2021-10-05 (×64): 1
  Filled 2021-08-30 (×72): qty 1

## 2021-08-30 MED ORDER — AMANTADINE HCL 50 MG/5ML PO SOLN
200.0000 mg | ORAL | Status: DC
  Administered 2021-08-30 – 2021-10-04 (×6): 200 mg
  Filled 2021-08-30 (×7): qty 20

## 2021-08-30 NOTE — Progress Notes (Signed)
Subjective: Patient reports on trach collar. Eyes open spontaneously. Following simple commands. No acute events overnight.  Objective: Vital signs in last 24 hours: Temp:  [99.2 F (37.3 C)-100 F (37.8 C)] 99.2 F (37.3 C) (06/05 0400) Pulse Rate:  [71-83] 75 (06/05 0720) Resp:  [11-32] 19 (06/05 0720) BP: (82-169)/(36-84) 115/45 (06/05 0700) SpO2:  [95 %-100 %] 100 % (06/05 0720) Arterial Line BP: (109-168)/(46-91) 138/91 (06/05 0720) FiO2 (%):  [28 %] 28 % (06/05 0433) Weight:  [104 kg] 104 kg (06/05 0500)  Intake/Output from previous day: 06/04 0701 - 06/05 0700 In: 1680 [NG/GT:1680] Out: 930 [Drains:230; Stool:700] Intake/Output this shift: No intake/output data recorded.  Physical Exam: Patient with trach on trach collar. She is resting comfortably in NAD. Eyes open spontaneously, no eye contact. Follows simple commands on the right. No purposeful movement on the left. Pupils 50m NR. EVD site is intact with good pulsatile flow.    Lab Results: Recent Labs    08/27/21 1123 08/28/21 0435  WBC  --  6.3  HGB 10.9* 10.2*  HCT 32.0* 33.3*  PLT  --  89*   BMET Recent Labs    08/29/21 0514 08/30/21 0441  NA 137 136  K 3.7 4.0  CL 99 98  CO2 28 25  GLUCOSE 151* 148*  BUN 18 33*  CREATININE 3.65* 5.30*  CALCIUM 8.3* 8.4*    Studies/Results: No results found.  Assessment/Plan: 56year old female who presented to the ED with nausea, vomiting, and left-sided hemiparesis who was found to have a large thalamic bleed with intraventricular extension. While in the emergency department, her neurological status declined and required emergent intubation for airway protection. EVD placed on 08/23/2021 due to development of hydrocephalus. Follow up CT head after EVD placement revealed stable right thalamic bleed with a slight decrease in the lateral and third ventricles. Tracheostomy placed on 08/27/2021. Left hemiplegia persists. EVD continues to have good pulsatile flow. Plan  for PEG tube this week. Continue supportive care.   -Continue EVD, will raise to 20 cm H20      LOS: 7 days     JMarvis Moeller DNP, AGNP-C Neurosurgery Nurse Practitioner  [facility] [address], [place] [address]P: [phone]   F: [phone] 08/30/2021 7:37 AM

## 2021-08-30 NOTE — Progress Notes (Signed)
Patient ID: Janet Mitchell, female   DOB: 03/14/1966, 56 y.o.   MRN: [id number] [facility] Progress Note   Assessment/ Plan:   1.  Right thalamic intracranial hemorrhage: With intraventricular hemorrhage and hydrocephalus and now status post ventriculostomy drain placement by neurosurgery. 2. ESRD: She is usually on a Monday/Wednesday/Friday dialysis schedule and will undergo hemodialysis today per schedule as well as additional dialysis again tomorrow for aggressive ultrafiltration to help improve volume status/respiratory status.  Significant input from medication/tube feeds. 3. Anemia: Hemoglobin and hematocrit currently acceptable, continue to follow trend to decide on need to restart ESA. 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 5. Nutrition: On tube feeds, will continue to monitor electrolytes 6. Hypertension: Blood pressure marginally low, continue to monitor on current antihypertensive therapy and hemodialysis.  Subjective:   Without acute events noted overnight   Objective:   BP 91/70   Pulse 75   Temp 99.4 F (37.4 C) (Axillary)   Resp (!) 24   Ht '5\' 7"'$  (1.702 m)   Wt 104 kg   SpO2 100%   BMI 35.91 kg/m   Physical Exam: Gen: Unresponsive to voice/touch, ventriculostomy drain in situ with serosanguineous collection CVS: Pulse regular rhythm, normal rate, S1 and S2 normal Resp: On trach collar, anteriorly clear to auscultation, no rales/rhonchi Abd: Soft, obese, nontender, bowel sounds normal Ext: Trace-1+ lower extremity edema, left upper arm AV fistula with intact dressings and palpable thrill  Labs: BMET Recent Labs  Lab 08/23/21 1020 08/23/21 1624 08/24/21 0400 08/24/21 1329 08/25/21 0551 08/25/21 0821 08/26/21 0423 08/26/21 1583 08/27/21 0359 08/27/21 0840 08/27/21 1123 08/28/21 0435 08/29/21 0514 08/30/21 0441  NA  --    < > 147*   < > 147*   < > 147* 150* 151* 151* 150* 140 137  136  K  --   --  6.1*  --  4.2  --  3.5  --  3.8 4.0 3.7 3.1* 3.7 4.0  CL  --   --  109  --  112*  --  111  --   --  114*  --  102 99 98  CO2  --   --  25  --  25  --  27  --   --  26  --  '27 28 25  '$ GLUCOSE  --   --  134*  --  145*  --  108*  --   --  89  --  111* 151* 148*  BUN  --   --  86*  --  45*  --  21*  --   --  32*  --  19 18 33*  CREATININE  --   --  10.76*  --  7.01*  --  3.91*  --   --  6.41*  --  4.05* 3.65* 5.30*  CALCIUM  --   --  8.2*  --  8.2*  --  8.4*  --   --  7.9*  --  8.2* 8.3* 8.4*  PHOS 5.3*  --  5.4*  --  5.8*  --  3.1  --   --   --   --  4.0 3.5 4.8*   < > = values in this interval not displayed.   CBC Recent Labs  Lab 08/24/21 0400 08/25/21 0551 08/26/21 0423 08/27/21 0359 08/27/21 1123 08/28/21 0435  WBC 5.9 5.2 5.6  --   --  6.3  HGB 10.6*  10.3* 11.0* 11.2* 10.9* 10.2*  HCT 32.6* 32.7* 34.7* 33.0* 32.0* 33.3*  MCV 77.4* 79.6* 79.8*  --   --  79.7*  PLT 92* 91* 96*  --   --  89*    Medications:     amLODipine  10 mg Per Tube Daily   calcitRIOL  1 mcg Per Tube Q M,W,F   chlorhexidine  15 mL Mouth Rinse BID   Chlorhexidine Gluconate Cloth  6 each Topical Q0600   cloNIDine  0.1 mg Per Tube TID   fentaNYL (SUBLIMAZE) injection  200 mcg Intravenous Once   heparin injection (subcutaneous)  5,000 Units Subcutaneous Q8H   hydrALAZINE  100 mg Per Tube Q8H   insulin aspart  0-6 Units Subcutaneous Q4H   labetalol  200 mg Per Tube Q8H   losartan  100 mg Per Tube Daily   mouth rinse  15 mL Mouth Rinse q12n4p   multivitamin  1 tablet Per Tube QHS   pantoprazole sodium  40 mg Per Tube Daily   senna-docusate  1 tablet Per Tube BID   Elmarie Shiley, MD 08/30/2021, 9:00 AM

## 2021-08-30 NOTE — Progress Notes (Addendum)
STROKE TEAM PROGRESS NOTE   INTERVAL HISTORY Patient is seen in her room with no family at the bedside.  Her vital signs have been stable, and she has been on trach collar for over 24 hours.  EVD continues to drain.  She will follow commands with RUE and BLE.  Vital signs are stable.  Neurological exam is unchanged Vitals:   08/30/21 1315 08/30/21 1340 08/30/21 1351 08/30/21 1400  BP: (!) 142/70 140/70  (!) 170/71  Pulse:    75  Resp:    17  Temp:  99.4 F (37.4 C)    TempSrc:  Axillary    SpO2:    100%  Weight:   104.6 kg   Height:       CBC:  Recent Labs  Lab 08/26/21 0423 08/27/21 0359 08/27/21 1123 08/28/21 0435  WBC 5.6  --   --  6.3  HGB 11.0*   < > 10.9* 10.2*  HCT 34.7*   < > 32.0* 33.3*  MCV 79.8*  --   --  79.7*  PLT 96*  --   --  89*   < > = values in this interval not displayed.    Basic Metabolic Panel:  Recent Labs  Lab 08/29/21 0514 08/30/21 0441  NA 137 136  K 3.7 4.0  CL 99 98  CO2 28 25  GLUCOSE 151* 148*  BUN 18 33*  CREATININE 3.65* 5.30*  CALCIUM 8.3* 8.4*  MG 1.7 1.8  PHOS 3.5 4.8*    Lipid Panel:  Recent Labs  Lab 08/28/21 0435  TRIG 174*    HgbA1c:  No results for input(s): HGBA1C in the last 168 hours.  Urine Drug Screen: No results for input(s): LABOPIA, COCAINSCRNUR, LABBENZ, AMPHETMU, THCU, LABBARB in the last 168 hours.  Alcohol Level  No results for input(s): ETH in the last 168 hours.   IMAGING past 24 hours No results found.  PHYSICAL EXAM  Physical Exam  Constitutional: Appears well-developed and well-nourished middle-age African-American lady.  Cardiovascular: Normal rate and regular rhythm.  Respiratory: Effort normal, non-labored breathing, on trach collar  Neuro -s/p tracheostomy without sedation, eyes open with mild left ptosis, able to follow simple commands. EOMI. PERRL. Gag and cough present.  .  Face appears symmetrical.  Tongue protrusion midline.  Right side upper and lower extremity spontaneous  movement, left upper and lower extremity hemiplegia with mild withdraw on the LLE with pain. Sensation, coordination not cooperative and gait not tested.   ASSESSMENT/PLAN [name] is a 56 y.o. female with history of ESRD on HD MWF, CHF, HTN, DM presenting with nausea, vomiting, left side weakness present upon waking. Intubated emergently at [facility] and transferred to [facility] on 5/59.  NSGY consulted, EVD placement 5/29. MRI shows stable size in right thalamic hemorrhage with edema extending to the pons and midbrain. EVD still in place. Extubated 08/26/2021. Reintubated 08/27/2021 and then tracheostomy placed. Consult for PEG tube sent to trauma team.   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 Small volume intraventricular extension of hemorrhage. Mild hydrocephalus. 5/29 Head CT 2200- decrease in size of lateral and third ventricles s/p EVD placement  CTA head &  neck No arterial lesion underlying the right thalamic hematoma. Equivocal for spot sign which would be an adverse finding suggesting continued growth MRI  no change in size of the right thalamic intraparenchymal hemorrhage extending to midbrain, maximal dimension 3.6 cm by MRI. Surrounding edema, likely slightly more prominent. 2D Echo EF 60-65%, consistent with grade II diastolic dysfunction EEG - mild diffuse encephalopathy LDL 38 HgbA1c 6.2 VTE prophylaxis - SCDs aspirin 81 mg daily prior to admission, now on No antithrombotic.  Therapy recommendations:  Pending Disposition:  Pending  Obstructive Hydrocephalus NSGY on board 5/29- EVD placement  Follow up CT shows slightly decrease in size of lateral and third ventricle   Plan for increase level and attempt clamping soon.   Cerebral edema On 3% saline @ 75- d/c'd Given ESRD, try to avoid fluid overload, will d/c 3% saline Na goal 150-155 Na 142->146->141->147 -> 150-> 136  Acute Hypoxic Respiratory failure Intubated 5/29 CCM managing vent Extubated 6/1 Reintubated and s/p trach 6/2  Hypertensive emergency Home meds:  labetolol, hydralazine, losartan Stable BP less than 160 On losartan 100, labetalol 200 3 times daily, hydralazine 50 3 times daily Add amlodipine 10 Long-term BP goal normotensive Still on cleviprex, taper off as able  End Stage Renal Failure on hemodialysis MWF Nephrology consulted Dialysis done 5/30 Monitor BMP/electrolytes  Diabetes Mellitus A1c 6.2, controlled CBGs SSI Close PCP follow-up  Other Stroke Risk Factors Obesity, Body mass index is 36.12 kg/m., BMI >/= 30 associated with increased stroke risk, recommend weight loss, diet and exercise as appropriate  Congestive heart failure  Other Active Problems Hyperkalemia  K 6.1->4.2-> 3.5, s/p HD and Lokelma Dysphagia - cortrak placed on [facility] day # 7  Patient seen and examined by NP/APP with MD. MD to update note as needed.   [place] , MSN, AGACNP-BC [facility] See Amion for schedule and pager information 08/30/2021 2:46 PM   I have personally obtained history,examined this patient, reviewed notes, independently viewed imaging studies, participated in medical decision making and plan of care.ROS completed by me personally and pertinent positives fully documented  I have made any additions or clarifications directly to the above note. Agree with note above.  Patient neurological exam remains unchanged.  Ventriculostomy is still draining well and neurosurgery is following and hopefully will try to challenge her and raise up about this week to try to remove it if she can tolerate.  She is tolerating trach collar valve now for more  than 24 hours.  Continue strict blood pressure control.  Continue ongoing therapies.  Discussed with Dr. Lynetta Mare critical care medicine.This patient is critically ill and at significant risk of neurological worsening, death and care requires constant monitoring of vital signs, hemodynamics,respiratory and cardiac monitoring, extensive review of multiple databases, frequent neurological assessment, discussion with family, other specialists and medical decision making of high complexity.I have made any additions or clarifications directly to the above note.This critical care time does not reflect procedure time, or teaching time or supervisory time of PA/NP/Med Resident etc but could involve care discussion time.  I spent 30 minutes of neurocritical care time  in the care of  this patient.      Antony Contras, MD Medical Director [place] Pager: [phone] 08/30/2021 4:56 PM   To contact Stroke Continuity provider, please refer to [email or web address] After hours, contact General Neurology

## 2021-08-30 NOTE — Progress Notes (Signed)
NAME:  Janet Mitchell, MRN:  [id number], DOB:  04-14-1965, LOS: 7 ADMISSION DATE:  08/23/2021, CONSULTATION DATE:  08/23/2021 REFERRING MD:  Dr. Rory Mitchell - Neuro CHIEF COMPLAINT: Code Stroke  History of Present Illness:  56 year old woman who presented to [facility] 5/29 with reported left-sided weakness, slurred speech, nausea/vomiting. LKN 6 hours prior to arrival. PMHx significant for HTN, CHF (Echo 07/2021 EF 60-65%, severe LVH, G2DD), T2DM (c/b diabetic neuropathy with subsequent L great toe amputation 2/2 ulcer/infection), ESRD (on HD MWF via [facility] TDC).  On arrival to ED, patient is somnolent with slight disconjugate gaze with near flaccid left upper arm and left leg weakness. BP 222/107. CT Head with 5cc acute hematoma in the right thalamus with intraventricular extension. Neurology consulted and patient transferred to [facility] for further evaluation. PCCM consulted for admission.  On arrival to unit patient is intubated, pupils 75m bilaterally and non-reactive. Withdrawals in all extremities, does not follow commands, on Propofol and Cleviprex gtt.    Pertinent Medical History:  ESRD with HD MWF, CHF, HTN, DM, s/p left great toe amputation secondary to ulcer/infection   Significant Hospital Events: Including procedures, antibiotic start and stop dates in addition to other pertinent events   [facility], more awake, following commands with RUE/RLE 6/1 No issues overnight, she remains alert and interactive on vent.  Extubated 6/2 failed extubation requiring reintubation with eventual establishment of tracheostomy by afternoon 6/4 Placed on ATC yesterday afternoon and has tolerated well since  Interim History / Subjective:   Tolerating TC well.  This AM, Ms. PAlmyra Deforestnoted she felt tired but did rest well overnight. She endorse diffuse body aches but no particular area of pain.  Plan for HD today  Objective:  Blood pressure  91/70, pulse 75, temperature 99.4 F (37.4 C), temperature source Axillary, resp. rate (!) 24, height '5\' 7"'$  (1.702 m), weight 104 kg, SpO2 100 %.    FiO2 (%):  [28 %] 28 %   Intake/Output Summary (Last 24 hours) at 08/30/2021 0838 Last data filed at 08/30/2021 0800 Gross per 24 hour  Intake 1680 ml  Output 928 ml  Net 752 ml    Filed Weights   08/28/21 1612 08/29/21 0500 08/30/21 0500  Weight: 106.2 kg 104.1 kg 104 kg   Physical Examination:  General: Acutely ill appearing middle aged female now on ATC, in NAD HEENT: 6 cuffed trach midline, MM pink/moist, PERRL,  Neuro: Sleepy but wakes easily to voice. Answers questions appropriately by nodding. She is able to move RUE only.  CV: s1s2 regular rate and rhythm, no murmur, rubs, or gallops,  PULM: Clear to auscultation throughout. No increased work of breathing. Minimal secretions noted.  GI: soft, bowel sounds active in all 4 quadrants, non-tender, non-distended, tolerating TF Extremities: warm/dry, no edema  Skin: no rashes or lesions  Resolved Hospital Problem List:   Slight Hyperkalemia, K 6.0 Hypernatremia   Assessment & Plan:   Right Thalamus ICH with IVH and Obstructive Hydrocephalus s/p EVD - Repeat MRI brain 5/31 No change in size of the right thalamic intraparenchymal hemorrhage is suspected, maximal dimension 3.6 cm by MRI.  - HTS stopped 6/1 P: Maintain neuro protective measures Nutrition and bowel regiment  Seizure precautions  Aspirations precautions  EVD per NSGY. Per note, plan to increase pressure today.   Acute Hypoxic Respiratory Failure  Aspiration Pneumonia - Secondary to above  - Tracheostomy placed on 6/2 due  to multiple failed extubations. Tolerating TC since 6/3 AM.  - S/p 5 days of IV Zosyn P: Routine trach care  Head of bed elevated 30 degrees Minimize sedation  HTN Emergency  - Goal SBP < 160 per Stroke P: Continue Norvasc, Clonidine, Hydralazine, Losartan, and Labetalol Due to HD related  hypotension, hold Hydralazine dose prior to HD Continuous telemetry   ESRD on HD MWF  P: Nephrology following for HD management Avoid nephrotoxins Ensure adequate renal perfusion   DM P: SSI q4h CBG goal 140-180 CBG checks q4hrs  Foot Ulcer, +Pseudomonas on recent culture  -Was recently prescribed Cipro, will change to Zosyn for now to also cover for possible aspiration event. S/P 5 days of IV Zosyn   Best Practice: (right click and "Reselect all SmartList Selections" daily)   Diet/type: NPO DVT prophylaxis: SCD GI prophylaxis: PPI Lines: N/A Foley:  N/A Code Status:  full code Last date of multidisciplinary goals of care discussion [Per primary]   Dr. Jose Mitchell Internal Medicine PGY-3   08/30/2021, 8:39 AM

## 2021-08-30 NOTE — Consult Note (Signed)
Janet Mitchell [birth year]  [id number].    Requesting MD: Jose Persia, MD Chief Complaint/Reason for Consult: Dysphagia, PEG consult  HPI: Janet Mitchell is a 56 y.o. female with a history of HTN, CHF, DM and ESRD on HD MWF who presented to [facility] on 5/29 with left-sided weakness, slurred speech and was found to have Right Thalamus ICH with IVH and Obstructive Hydrocephalus s/p EVD.  Patient has persistent left-sided hemiplegia.  She underwent tracheostomy on 6/2.  She is on trach collar currently.  Patient is currently tolerating tube feeds without N/V.  We are asked to see for PEG.  Last seen by speech on 6/4.  No swallow screen has been performed yet.  Patient has a history of cholecystectomy in the past.   ROS: As above, unable to fully review 2/2 recent CVA   Family History  Problem Relation Age of Onset   Heart disease Mother    Thrombocytopenia Mother        TTP   Heart failure Father    Kidney disease Paternal Grandfather    Colon cancer Neg Hx     Past Medical History:  Diagnosis Date   Abdominal wall hematoma 12/26/2019   Anemia    Arthritis    CHF (congestive heart failure) ([place])    pt states she has been cleared of heart failure/disease   Chronic cholecystitis with calculus    COVID-19 03/26/2019   COVID-19 virus infection 03/2019   ESRD (end stage renal disease) on dialysis [facility])    Dialysis M-W-F   Headache    "a few/wk" (03/09/2018) - no longer having these   History of blood transfusion 10/2017   "low blood count" (03/09/2018)   Hypertension    Seasonal allergies    Spinal headache    Type II diabetes mellitus ([place])     Past Surgical History:  Procedure Laterality Date   AMPUTATION TOE Left 2013   Great toe   AV FISTULA PLACEMENT Left 07/22/2019   Procedure: LEFT ARM BASILIC ARTERIOVENOUS (AV) FISTULA CREATION;  Surgeon: Rosetta Posner, MD;  Location: [place];  Service: Vascular;  Laterality: Left;   [place] Left 10/17/2019    Procedure: LEFT SECOND STAGE [place];  Surgeon: Rosetta Posner, MD;  Location: [place] OR;  Service: Vascular;  Laterality: Left;   BIOPSY  10/24/2018   Procedure: BIOPSY;  Surgeon: Daneil Dolin, MD;  [facility];  Service: Endoscopy;;  right and left colon   CATARACT EXTRACTION W/ INTRAOCULAR LENS IMPLANT Right    [place]; [place] N/A 03/09/2018   Procedure: LAPAROSCOPIC CHOLECYSTECTOMY WITH INTRAOPERATIVE CHOLANGIOGRAM ERAS PATHWAY;  Surgeon: Donnie Mesa, MD;  Location: [place];  Service: General;  Laterality: N/A;   COLONOSCOPY N/A 10/24/2018   Procedure: COLONOSCOPY;  Surgeon: Daneil Dolin, MD;  [facility];  Service: Endoscopy;  Laterality: N/A;  1:00pm   EYE SURGERY Left 05/15/2018   Removal of blood in the globe (due to DM)   FLEXIBLE SIGMOIDOSCOPY N/A 11/24/2017   Procedure: FLEXIBLE SIGMOIDOSCOPY;  Surgeon: Daneil Dolin, MD;  [facility];  Service: Endoscopy;  Laterality: N/A;   IR FLUORO GUIDE CV LINE RIGHT  07/17/2019   IR PARACENTESIS  07/09/2019   IR US GUIDE VASC ACCESS RIGHT  07/17/2019   LAPAROSCOPIC CHOLECYSTECTOMY  03/09/2018   PERITONEAL CATHETER INSERTION  2017   POLYPECTOMY  10/24/2018   Procedure: POLYPECTOMY;  Surgeon:  Rourk, Cristopher Estimable, MD;  [facility];  Service: Endoscopy;;   TOTAL HIP ARTHROPLASTY Left 1997    Social History:  reports that she has never smoked. She has been exposed to tobacco smoke. She has never used smokeless tobacco. She reports that she does not drink alcohol and does not use drugs.  Allergies: No Known Allergies  Medications Prior to Admission  Medication Sig Dispense Refill   acetaminophen (TYLENOL) 325 MG tablet Take 650 mg by mouth every 6 (six) hours as needed (pain).     aspirin EC 81 MG tablet Take 1 tablet (81 mg total) by mouth daily.     B Complex-C-Folic Acid (RENA-VITE PO) Take 1 tablet by mouth daily.     cadexomer iodine  (IODOSORB) 0.9 % gel Apply to left foot ulcer once daily. (Patient taking differently: Apply 1 application. topically daily. Apply to left foot ulcer) 40 g 0   calcitRIOL (ROCALTROL) 0.5 MCG capsule Take 1-2 capsules (0.5-1 mcg total) by mouth See admin instructions. 0.55mg on Mondays through Fridays and take 120m on Saturdays and Sundays 90 capsule 1   calcium acetate (PHOSLO) 667 MG capsule Take by mouth.     cetirizine (ZYRTEC) 10 MG tablet TAKE 1 TABLET BY MOUTH ONCE DAILY. 30 tablet 0   [EXPIRED] ciprofloxacin (CIPRO) 500 MG tablet Take 1 tablet (500 mg total) by mouth 2 (two) times daily for 10 days. 20 tablet 0   hydrALAZINE (APRESOLINE) 10 MG tablet Take 10 mg by mouth 2 (two) times daily.     labetalol (NORMODYNE) 200 MG tablet Take 1 tablet (200 mg total) by mouth 2 (two) times daily. 60 tablet 2   losartan (COZAAR) 100 MG tablet TAKE ONE TABLET BY MOUTH DAILY (Patient taking differently: Take 100 mg by mouth daily.) 90 tablet 0   Accu-Chek Softclix Lancets lancets Please use it to check blood glucose twice daily and PRN. 100 each 5   Blood Glucose Monitoring Suppl (ACCU-CHEK GUIDE) w/Device KIT 1 Piece by Does not apply route as directed. 1 kit 0   cholestyramine (QUESTRAN) 4 g packet Take 0.5 packets (2 g total) by mouth daily. (Patient not taking: Reported on 08/23/2021) 60 each 1   glucose blood (ACCU-CHEK GUIDE) test strip Use as instructed 100 each 5   psyllium (METAMUCIL) 58.6 % packet Take by mouth. (Patient not taking: Reported on 08/23/2021)     FiO2 (%):  [28 %] 28 %   Physical Exam: Blood pressure (!) 135/47, pulse 69, temperature 99.4 F (37.4 C), temperature source Axillary, resp. rate (!) 25, height '5\' 7"'  (1.702 m), weight 104.6 kg, SpO2 98 %. General: WD/WN female lying in bed in NAD HEENT: Ears and nose without any masses or lesions.  Mouth is pink and moist. Dentition fair. EVD in place Heart: regular, rate, and rhythm.  No obvious murmurs. Palpable pedal pulses  bilaterally  Lungs: CTA b/l. Respiratory effort nonlabored on trach collar Abd:  Soft, NT, ND, +BS, no masses, hernias, or organomegaly. Prior laparoscopic scars noted  MS: no BUE/BLE edema Psych: Unable to fully assess Neuro: Moves RUE and RLE to command. Does not move LUE or LLE to command for me.   Results for orders placed or performed during the hospital encounter of 08/23/21 (from the past 48 hour(s))  Glucose, capillary     Status: Abnormal   Collection Time: 08/28/21  7:57 PM  Result Value Ref Range   Glucose-Capillary 185 (H) 70 - 99 mg/dL  Comment: Glucose reference range applies only to samples taken after fasting for at least 8 hours.  Glucose, capillary     Status: Abnormal   Collection Time: 08/28/21 11:40 PM  Result Value Ref Range   Glucose-Capillary 170 (H) 70 - 99 mg/dL    Comment: Glucose reference range applies only to samples taken after fasting for at least 8 hours.  Glucose, capillary     Status: Abnormal   Collection Time: 08/29/21  3:45 AM  Result Value Ref Range   Glucose-Capillary 149 (H) 70 - 99 mg/dL    Comment: Glucose reference range applies only to samples taken after fasting for at least 8 hours.  Basic metabolic panel     Status: Abnormal   Collection Time: 08/29/21  5:14 AM  Result Value Ref Range   Sodium 137 135 - 145 mmol/L   Potassium 3.7 3.5 - 5.1 mmol/L   Chloride 99 98 - 111 mmol/L   CO2 28 22 - 32 mmol/L   Glucose, Bld 151 (H) 70 - 99 mg/dL    Comment: Glucose reference range applies only to samples taken after fasting for at least 8 hours.   BUN 18 6 - 20 mg/dL   Creatinine, Ser 3.65 (H) 0.44 - 1.00 mg/dL   Calcium 8.3 (L) 8.9 - 10.3 mg/dL   GFR, Estimated 14 (L) >60 mL/min    Comment: (NOTE) Calculated using the CKD-EPI Creatinine Equation (2021)    Anion gap 10 5 - 15    Comment: Performed at [facility] [address], [place], [place] [address]  Phosphorus     Status: None   Collection Time: 08/29/21  5:14 AM  Result  Value Ref Range   Phosphorus 3.5 2.5 - 4.6 mg/dL    Comment: Performed at [facility] [address], [place], [place] [address]  Magnesium     Status: None   Collection Time: 08/29/21  5:14 AM  Result Value Ref Range   Magnesium 1.7 1.7 - 2.4 mg/dL    Comment: Performed at [facility] [address] [place], [place] [address]  Glucose, capillary     Status: Abnormal   Collection Time: 08/29/21  7:57 AM  Result Value Ref Range   Glucose-Capillary 142 (H) 70 - 99 mg/dL    Comment: Glucose reference range applies only to samples taken after fasting for at least 8 hours.  Glucose, capillary     Status: Abnormal   Collection Time: 08/29/21 12:32 PM  Result Value Ref Range   Glucose-Capillary 187 (H) 70 - 99 mg/dL    Comment: Glucose reference range applies only to samples taken after fasting for at least 8 hours.  Glucose, capillary     Status: Abnormal   Collection Time: 08/29/21  4:59 PM  Result Value Ref Range   Glucose-Capillary 150 (H) 70 - 99 mg/dL    Comment: Glucose reference range applies only to samples taken after fasting for at least 8 hours.  Glucose, capillary     Status: Abnormal   Collection Time: 08/29/21  7:59 PM  Result Value Ref Range   Glucose-Capillary 209 (H) 70 - 99 mg/dL    Comment: Glucose reference range applies only to samples taken after fasting for at least 8 hours.  Glucose, capillary     Status: Abnormal   Collection Time: 08/29/21 11:58 PM  Result Value Ref Range   Glucose-Capillary 154 (H) 70 - 99 mg/dL    Comment: Glucose reference  range applies only to samples taken after fasting for at least 8 hours.  Glucose, capillary     Status: Abnormal   Collection Time: 08/30/21  4:04 AM  Result Value Ref Range   Glucose-Capillary 149 (H) 70 - 99 mg/dL    Comment: Glucose reference range applies only to samples taken after fasting for at least 8 hours.  Basic metabolic panel     Status: Abnormal   Collection Time: 08/30/21  4:41 AM   Result Value Ref Range   Sodium 136 135 - 145 mmol/L   Potassium 4.0 3.5 - 5.1 mmol/L   Chloride 98 98 - 111 mmol/L   CO2 25 22 - 32 mmol/L   Glucose, Bld 148 (H) 70 - 99 mg/dL    Comment: Glucose reference range applies only to samples taken after fasting for at least 8 hours.   BUN 33 (H) 6 - 20 mg/dL   Creatinine, Ser 5.30 (H) 0.44 - 1.00 mg/dL   Calcium 8.4 (L) 8.9 - 10.3 mg/dL   GFR, Estimated 9 (L) >60 mL/min    Comment: (NOTE) Calculated using the CKD-EPI Creatinine Equation (2021)    Anion gap 13 5 - 15    Comment: Performed at [facility] [address], [place], [place] [address]  Phosphorus     Status: Abnormal   Collection Time: 08/30/21  4:41 AM  Result Value Ref Range   Phosphorus 4.8 (H) 2.5 - 4.6 mg/dL    Comment: Performed at [facility] [address], [place] [address], [place] [address]  Magnesium     Status: None   Collection Time: 08/30/21  4:41 AM  Result Value Ref Range   Magnesium 1.8 1.7 - 2.4 mg/dL    Comment: Performed at [facility] [address], [place], [place] [address]  Glucose, capillary     Status: Abnormal   Collection Time: 08/30/21  8:03 AM  Result Value Ref Range   Glucose-Capillary 164 (H) 70 - 99 mg/dL    Comment: Glucose reference range applies only to samples taken after fasting for at least 8 hours.  Glucose, capillary     Status: Abnormal   Collection Time: 08/30/21 11:55 AM  Result Value Ref Range   Glucose-Capillary 143 (H) 70 - 99 mg/dL    Comment: Glucose reference range applies only to samples taken after fasting for at least 8 hours.   No results found.  Anti-infectives (From admission, onward)    Start     Dose/Rate Route Frequency Ordered Stop   08/23/21 1230  piperacillin-tazobactam (ZOSYN) IVPB 2.25 g        2.25 g 100 mL/hr over 30 Minutes Intravenous Every 8 hours 08/23/21 1141 08/28/21 0523       Assessment/Plan Dysphagia This is a 56 year old female with a history of recent CVA who we were  asked to see for PEG placement. Patient is currently requiring TFs via cortrak for nutrition and would likely benefit from PEG placement but I have reached out SLP to discuss last screening session and ensure their recommendations. No family at bedside. I have asked RN to let me know when they return so we can obtain consent to proceed. Hold TF's at midnight incase we can align everything for procedure to occur tomorrow.   Jillyn Ledger, [facility] Surgery 08/30/2021, 4:09 PM Please see Amion for pager number during day hours 7:00am-4:30pm

## 2021-08-30 NOTE — Telephone Encounter (Signed)
FYI spoke to patient husband to confirm AWV visit, he said she had a stroke and in the ICU at [facility].

## 2021-08-30 NOTE — Progress Notes (Signed)
Speech Language Pathology Treatment: Nada Boozer Speaking valve  Patient Details Name: Loreli Debruler Pinnix MRN: [id number] DOB: 09-Mar-1966 Today's Date: 08/30/2021 Time: 2395-3202 SLP Time Calculation (min) (ACUTE ONLY): 9 min  Assessment / Plan / Recommendation Clinical Impression  Pt was seen this afternoon for ongoing PMV trials but unfortunately very lethargic s/p HD today  (although note that she had been somewhat drowsy with SLP on previous date as well). This afternoon she kept her eyes closed but did nod her head at times, so cuff was deflated and PMV placed. PMV was donned for 1-2 minute intervals, noting a few instances of soft, spontaneous vocalization but not functional verbalizations. PMV was doffed due to level of lethargy, but with no overt signs of back pressure or change in vitals. She may be more appropriate to wear it for longer intervals as her level of alertness improves. May also want to consider transition to cuffless trach if she continues to remain off the vent.   HPI HPI: Patient is a 56 y.o. female with PMH: ESRD on HD MWF, CHF, HTN, DM who presented to [facility] as a code stroke with c/o nausea and vomiting upon waking up in the morning along with left sided weakness. She was communicative at [facility] but during stay awaiting transfer to [facility] she required emergent intubation due to inability to protect airway. 6/1 CXR showed Diffuse bilateral lung opacities may be due to pulmonary edema or multifocal pneumonia. MRI brain showed right thalamic intraparenchymal hemorrhage  with surrounding edema. She was extubated on 6/1 and although initially tolerated well, quickly was seen with poor, ineffective cough, copious secretions unable to clear independently but in no obvious distress and oxygen saturations 100% on 4L nasal cannula. She was reintubated on 6/2 to tracheostomy #6 cuffed Shiley.      SLP Plan  Continue with current plan of care      Recommendations for follow up therapy are one  component of a multi-disciplinary discharge planning process, led by the attending physician.  Recommendations may be updated based on patient status, additional functional criteria and insurance authorization.    Recommendations         Patient may use Passy-Muir Speech Valve: with SLP only         Oral Care Recommendations: Oral care QID Follow Up Recommendations: Acute inpatient rehab (3hours/day) Assistance recommended at discharge: Frequent or constant Supervision/Assistance SLP Visit Diagnosis: Aphonia (R49.1) Plan: Continue with current plan of care           Osie Bond., M.A. [place] Office [phone]  Secure chat preferred\  08/30/2021, 4:33 PM

## 2021-08-30 NOTE — Telephone Encounter (Signed)
FYI

## 2021-08-31 DIAGNOSIS — I629 Nontraumatic intracranial hemorrhage, unspecified: Secondary | ICD-10-CM | POA: Diagnosis not present

## 2021-08-31 DIAGNOSIS — J9601 Acute respiratory failure with hypoxia: Secondary | ICD-10-CM | POA: Diagnosis not present

## 2021-08-31 LAB — GLUCOSE, CAPILLARY
Glucose-Capillary: 104 mg/dL — ABNORMAL HIGH (ref 70–99)
Glucose-Capillary: 155 mg/dL — ABNORMAL HIGH (ref 70–99)
Glucose-Capillary: 161 mg/dL — ABNORMAL HIGH (ref 70–99)
Glucose-Capillary: 183 mg/dL — ABNORMAL HIGH (ref 70–99)
Glucose-Capillary: 207 mg/dL — ABNORMAL HIGH (ref 70–99)

## 2021-08-31 MED ORDER — SENNOSIDES-DOCUSATE SODIUM 8.6-50 MG PO TABS
1.0000 | ORAL_TABLET | Freq: Two times a day (BID) | ORAL | Status: DC | PRN
  Administered 2021-09-22 – 2021-09-30 (×2): 1
  Filled 2021-08-31 (×2): qty 1

## 2021-08-31 NOTE — Progress Notes (Signed)
Ordered by CCM to restart tube feedings. Per CCM, "no rush" on PEG as pt is improving.

## 2021-08-31 NOTE — Progress Notes (Signed)
Patient ID: Janet Mitchell, female   DOB: 08-10-1965, 57 y.o.   MRN: [id number] [facility] Progress Note   Assessment/ Plan:   1.  Right thalamic intracranial hemorrhage: With intraventricular hemorrhage and hydrocephalus and now status post ventriculostomy drain placement by neurosurgery. Plans noted for attempting clamping of ventriculostomy tube per neurology.  2. ESRD: She is usually on a Monday/Wednesday/Friday dialysis schedule and will undergo hemofiltration again today to help improve volume status/respiratory status.  Significant input from medication/tube feeds. Resume routine HD tomorrow.  3. Anemia: Hemoglobin and hematocrit currently acceptable, continue to follow trend to decide on need to restart ESA. 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 5. Nutrition: On tube feeds at this time and seen by surgery for possible PEG tube placement tomorrow.  6. Hypertension: Blood pressure marginally low, continue to monitor on current antihypertensive therapy and hemodialysis.  Subjective:   No acute clinical events noted overnight. Plans for PEG tube placement noted.    Objective:   BP (!) 175/74   Pulse 73   Temp 98.7 F (37.1 C) (Axillary)   Resp (!) 23   Ht '5\' 7"'$  (1.702 m)   Wt 104.6 kg   SpO2 100%   BMI 36.12 kg/m   Physical Exam: Gen: Awake and moving in bed- raises right arm intermittently, ventriculostomy drain in situ with serosanguineous collection CVS: Pulse regular rhythm, normal rate, S1 and S2 normal Resp: On trach collar, anteriorly clear to auscultation, no rales/rhonchi Abd: Soft, obese, nontender, bowel sounds normal Ext: Trace-1+ lower extremity edema, left upper arm AV fistula with intact dressings and palpable thrill  Labs: BMET Recent Labs  Lab 08/25/21 0551 08/25/21 1655 08/26/21 0423 08/26/21 0819 08/27/21 0359 08/27/21 0840 08/27/21 1123 08/28/21 0435 08/29/21 0514  08/30/21 0441  NA 147*   < > 147* 150* 151* 151* 150* 140 137 136  K 4.2  --  3.5  --  3.8 4.0 3.7 3.1* 3.7 4.0  CL 112*  --  111  --   --  114*  --  102 99 98  CO2 25  --  27  --   --  26  --  '27 28 25  '$ GLUCOSE 145*  --  108*  --   --  89  --  111* 151* 148*  BUN 45*  --  21*  --   --  32*  --  19 18 33*  CREATININE 7.01*  --  3.91*  --   --  6.41*  --  4.05* 3.65* 5.30*  CALCIUM 8.2*  --  8.4*  --   --  7.9*  --  8.2* 8.3* 8.4*  PHOS 5.8*  --  3.1  --   --   --   --  4.0 3.5 4.8*   < > = values in this interval not displayed.   CBC Recent Labs  Lab 08/25/21 0551 08/26/21 0423 08/27/21 0359 08/27/21 1123 08/28/21 0435  WBC 5.2 5.6  --   --  6.3  HGB 10.3* 11.0* 11.2* 10.9* 10.2*  HCT 32.7* 34.7* 33.0* 32.0* 33.3*  MCV 79.6* 79.8*  --   --  79.7*  PLT 91* 96*  --   --  89*    Medications:     amantadine  200 mg Per Tube Weekly   amLODipine  10 mg Per Tube Daily   calcitRIOL  1 mcg Per Tube Q M,W,F   chlorhexidine  15  mL Mouth Rinse BID   Chlorhexidine Gluconate Cloth  6 each Topical Q0600   cloNIDine  0.1 mg Per Tube TID   fentaNYL (SUBLIMAZE) injection  200 mcg Intravenous Once   fiber  1 packet Per Tube BID   heparin injection (subcutaneous)  5,000 Units Subcutaneous Q8H   hydrALAZINE  100 mg Per Tube Q8H   insulin aspart  0-6 Units Subcutaneous Q4H   labetalol  200 mg Per Tube Q8H   losartan  100 mg Per Tube Daily   mouth rinse  15 mL Mouth Rinse q12n4p   multivitamin  1 tablet Per Tube QHS   senna-docusate  1 tablet Per Tube BID   Elmarie Shiley, MD 08/31/2021, 8:24 AM

## 2021-08-31 NOTE — Progress Notes (Addendum)
Inpatient Rehab Admissions Coordinator:   CIR consult received. Currently, Pt. Is not yet medically ready (currently with cuffed trach, EVD, requiring mitten restraints) for CIR and has not yet demonstrated ability to tolerate the intensity of CIR (no out of bed yet). It is possible she will progress to being appropriate for CIR, so I will follow for medical readiness and progress and participation with therapies. If medical team feels she is likely to be stable for d/c in the next 1-2 days, would recommend looking at other rehab venues.   Clemens Catholic, [place], [place] Admissions Coordinator  [phone] ([place]) [phone] (office)

## 2021-08-31 NOTE — Progress Notes (Signed)
Physical Therapy Treatment Patient Details Name: Janet Mitchell MRN: [id number] DOB: [birth year] Today's Date: 08/31/2021   History of Present Illness 56 y.o. female presents to [facility] hospital as a transfer from [place] on 08/23/2021 with complaints of nausea, vomiting, L weakness. Pt required emergent intubation during transfer from [facility]. CTH reveals R thalamic hemorrhage with IVH. Pt underwent R frontal ventriculostomy on 5/29. Extubated 6/1, reintubated 6/2, tracheostomy 6/2. PMH includes HTN, CHF, MDII, ESRD.    PT Comments    Pt alert and interactive upon therapist arrival. Bed placed in chair position. ROM/stretching LUE/LE. Flexion synergy pattern noted LUE when pt coughing. Pt able to use washcloth in R hand to wash her face. Following simple commands consistently. She required max assist to pull forward to long sit when bed in chair position x 2 trials. Pt became very fatigued, falling asleep and no longer attending to tasks. HOB left at 45 degrees at end of session. When activity tolerance improves, pt will need maximove for OOB transfers.    Recommendations for follow up therapy are one component of a multi-disciplinary discharge planning process, led by the attending physician.  Recommendations may be updated based on patient status, additional functional criteria and insurance authorization.  Follow Up Recommendations  Acute inpatient rehab (3hours/day)     Assistance Recommended at Discharge Frequent or constant Supervision/Assistance  Patient can return home with the following Two people to help with walking and/or transfers;Two people to help with bathing/dressing/bathroom;Assistance with cooking/housework;Assistance with feeding;Direct supervision/assist for medications management;Direct supervision/assist for financial management;Help with stairs or ramp for entrance;Assist for transportation   Equipment Recommendations  Hospital bed;Other (comment);Wheelchair  (measurements PT);Wheelchair cushion (measurements PT)    Recommendations for Other Services       Precautions / Restrictions Precautions Precautions: Fall;Other (comment) Precaution Comments: trach, EVD, cortrack, flexiseal     Mobility  Bed Mobility Overal bed mobility: Needs Assistance             General bed mobility comments: Max assist to pull forward to long sit with bed in chair position.    Transfers                   General transfer comment: will require maximove for OOB    Ambulation/Gait                   Stairs             Wheelchair Mobility    Modified Rankin (Stroke Patients Only)       Balance Overall balance assessment: Needs assistance   Sitting balance-Leahy Scale: Zero                                      Cognition Arousal/Alertness: Awake/alert Behavior During Therapy: Flat affect Overall Cognitive Status: Difficult to assess                                 General Comments: Alert and appropriate. Following simple commads.        Exercises      General Comments General comments (skin integrity, edema, etc.): VSS, pt on 5L 28% FiO2 trach collar      Pertinent Vitals/Pain Pain Assessment Pain Assessment: Faces Pain Score: 0-No pain    Home Living  Prior Function            PT Goals (current goals can now be found in the care plan section) Acute Rehab PT Goals Patient Stated Goal: not stated Progress towards PT goals: Progressing toward goals    Frequency    Min 3X/week      PT Plan      Co-evaluation              AM-PAC PT "6 Clicks" Mobility   Outcome Measure  Help needed turning from your back to your side while in a flat bed without using bedrails?: Total Help needed moving from lying on your back to sitting on the side of a flat bed without using bedrails?: Total Help needed moving to and from a bed to a  chair (including a wheelchair)?: Total Help needed standing up from a chair using your arms (e.g., wheelchair or bedside chair)?: Total Help needed to walk in hospital room?: Total Help needed climbing 3-5 steps with a railing? : Total 6 Click Score: 6    End of Session Equipment Utilized During Treatment: Oxygen Activity Tolerance: Patient limited by fatigue Patient left: in bed;with call bell/phone within reach Nurse Communication: Mobility status;Need for lift equipment PT Visit Diagnosis: Other abnormalities of gait and mobility (R26.89);Other symptoms and signs involving the nervous system (R29.898);Muscle weakness (generalized) (M62.81);Hemiplegia and hemiparesis Hemiplegia - Right/Left: Left Hemiplegia - caused by: Nontraumatic intracerebral hemorrhage     Time: 1027-1052 PT Time Calculation (min) (ACUTE ONLY): 25 min  Charges:  $Therapeutic Exercise: 8-22 mins $Therapeutic Activity: 8-22 mins                     Lorrin Goodell, PT  Office # [phone]    [name] 08/31/2021, 12:20 PM

## 2021-08-31 NOTE — Progress Notes (Addendum)
STROKE TEAM PROGRESS NOTE   INTERVAL HISTORY Patient is seen in her room with no family at the bedside.  She continues to do well on trach collar, and vital signs are stable.  EVD draining at 20 cm H2O.  PEG tube to be placed soon. PT recommends discharge to CIR when ready.  Her husband is at the bedside and he was updated about her condition and his questions were answered neurological exam remains unchanged.  Vital signs are stable.  Vitals:   08/31/21 1000 08/31/21 1114 08/31/21 1200 08/31/21 1215  BP: 117/77 (!) 159/73  115/82  Pulse: 75 75  74  Resp: '17 20  15  '$ Temp:   98.7 F (37.1 C)   TempSrc:   Axillary   SpO2: 100%   100%  Weight:      Height:       CBC:  Recent Labs  Lab 08/26/21 0423 08/27/21 0359 08/27/21 1123 08/28/21 0435  WBC 5.6  --   --  6.3  HGB 11.0*   < > 10.9* 10.2*  HCT 34.7*   < > 32.0* 33.3*  MCV 79.8*  --   --  79.7*  PLT 96*  --   --  89*   < > = values in this interval not displayed.    Basic Metabolic Panel:  Recent Labs  Lab 08/29/21 0514 08/30/21 0441  NA 137 136  K 3.7 4.0  CL 99 98  CO2 28 25  GLUCOSE 151* 148*  BUN 18 33*  CREATININE 3.65* 5.30*  CALCIUM 8.3* 8.4*  MG 1.7 1.8  PHOS 3.5 4.8*    Lipid Panel:  Recent Labs  Lab 08/28/21 0435  TRIG 174*    HgbA1c:  No results for input(s): HGBA1C in the last 168 hours.  Urine Drug Screen: No results for input(s): LABOPIA, COCAINSCRNUR, LABBENZ, AMPHETMU, THCU, LABBARB in the last 168 hours.  Alcohol Level  No results for input(s): ETH in the last 168 hours.   IMAGING past 24 hours No results found.  PHYSICAL EXAM  Physical Exam  Constitutional: Appears well-developed and well-nourished middle-age African-American lady.  Cardiovascular: Normal rate and regular rhythm.  Respiratory: Effort normal, non-labored breathing, on trach collar  Neuro -s/p tracheostomy without sedation, she is awake with eyes open with mild left ptosis, able to follow simple commands. Left  gaze preference but able to overcome. PERRL. Gag and cough present.  .  Face appears symmetrical.  Tongue protrusion midline.  Right side upper and lower extremity spontaneous movement, left upper and lower extremity hemiplegia with mild withdraw on the LLE with pain. Sensation, coordination not cooperative and gait not tested.   ASSESSMENT/PLAN Ms. Angie Piercey Pinnix is a 56 y.o. female with history of ESRD on HD MWF, CHF, HTN, DM presenting with nausea, vomiting, left side weakness present upon waking. Intubated emergently at [facility] and transferred to [facility] on 5/59.  NSGY consulted, EVD placement 5/29. MRI shows stable size in right thalamic hemorrhage with edema extending to the pons and midbrain. EVD still in place. Extubated 08/26/2021. Reintubated 08/27/2021 and then tracheostomy placed. Consult for PEG tube sent to trauma team.   ICH:  Right thalamic ICH extending to right midbrain with IVH and mild hydrocephalus s/p EVD, likely secondary to uncontrolled hypertension Code Stroke CT head Right thalamic IVH previously 2.9 x 1.7 x 3.1 cm 5/29 Head CT 1000- Interval significant increase in size of a an acute hematoma in the right thalamus, now measuring 3.6 x 2.5  x 3.8 cm. Small volume intraventricular extension of hemorrhage. Mild hydrocephalus. 5/29 Head CT 2200- decrease in size of lateral and third ventricles s/p EVD placement  CTA head & neck No arterial lesion underlying the right thalamic hematoma. Equivocal for spot sign which would be an adverse finding suggesting continued growth MRI  no change in size of the right thalamic intraparenchymal hemorrhage extending to midbrain, maximal dimension 3.6 cm by MRI. Surrounding edema, likely slightly more prominent. 2D Echo EF 60-65%, consistent with grade II diastolic dysfunction EEG - mild diffuse encephalopathy LDL 38 HgbA1c 6.2 VTE prophylaxis - SCDs aspirin 81 mg daily prior to admission, now on No antithrombotic.  Therapy recommendations:   CIR Disposition:  Pending  Obstructive Hydrocephalus NSGY on board 5/29- EVD placement  Follow up CT shows slightly decrease in size of lateral and third ventricle  Plan for increase level and attempt clamping soon.   Cerebral edema On 3% saline @ 75- d/c'd Given ESRD, try to avoid fluid overload, will d/c 3% saline Na goal 150-155 Na 142->146->141->147 -> 150-> 136  Acute Hypoxic Respiratory failure Intubated 5/29 CCM managing vent Extubated 6/1 Reintubated and s/p trach 6/2  Hypertensive emergency Home meds:  labetolol, hydralazine, losartan Stable BP less than 160 On losartan 100, labetalol 200 3 times daily, hydralazine 50 3 times daily Add amlodipine 10 Long-term BP goal normotensive Still on cleviprex, taper off as able  End Stage Renal Failure on hemodialysis MWF Nephrology consulted Dialysis done 5/30 Monitor BMP/electrolytes  Diabetes Mellitus A1c 6.2, controlled CBGs SSI Close PCP follow-up  Other Stroke Risk Factors Obesity, Body mass index is 36.12 kg/m., BMI >/= 30 associated with increased stroke risk, recommend weight loss, diet and exercise as appropriate  Congestive heart failure  Other Active Problems Hyperkalemia  K 6.1->4.2-> 3.5, s/p HD and Lokelma Dysphagia - cortrak placed on [facility] day # 8  Patient seen and examined by NP/APP with MD. MD to update note as needed.   [place] , MSN, AGACNP-BC [facility] See Amion for schedule and pager information 08/31/2021 2:11 PM   I have personally obtained history,examined this patient, reviewed notes, independently viewed imaging studies, participated in medical decision making and plan of care.ROS completed by me personally and pertinent positives fully documented  I have made any additions or clarifications directly to the above note. Agree with note above.  Continue weaning off ventriculostomy as per neurosurgery and hopefully discontinuing it in the next few days.   PEG tube placement later today by trauma team.  Continue ongoing therapies.  Continue strict blood pressure control.  Long discussion with patient and husband at the bedside and answered questions about her care.  Discussed with Dr. Lynetta Mare critical care medicine and trauma team PA.This patient is critically ill and at significant risk of neurological worsening, death and care requires constant monitoring of vital signs, hemodynamics,respiratory and cardiac monitoring, extensive review of multiple databases, frequent neurological assessment, discussion with family, other specialists and medical decision making of high complexity.I have made any additions or clarifications directly to the above note.This critical care time does not reflect procedure time, or teaching time or supervisory time of PA/NP/Med Resident etc but could involve care discussion time.  I spent 30 minutes of neurocritical care time  in the care of  this patient.      Antony Contras, MD Medical Director [facility] Pager: [phone] 08/31/2021 3:30 PM   To contact Stroke Continuity provider, please refer to  [email or web address] After hours, contact General Neurology

## 2021-08-31 NOTE — Progress Notes (Signed)
Subjective: Patient reports on trach collar. She is alert and following commands on the right only. Husband at bedside. No acute events overnight.   Objective: Vital signs in last 24 hours: Temp:  [98.3 F (36.8 C)-99.4 F (37.4 C)] 98.7 F (37.1 C) (06/06 0400) Pulse Rate:  [68-75] 73 (06/06 0700) Resp:  [14-25] 23 (06/06 0700) BP: (70-175)/(47-147) 175/74 (06/06 0700) SpO2:  [98 %-100 %] 100 % (06/06 0700) Arterial Line BP: (112-160)/(50-78) 154/64 (06/06 0700) FiO2 (%):  [28 %] 28 % (06/06 0339) Weight:  [104.6 kg-106.8 kg] 104.6 kg (06/05 1351)  Intake/Output from previous day: 06/05 0701 - 06/06 0700 In: 1190 [NG/GT:1190] Out: 7010 [Drains:210; Stool:400] Intake/Output this shift: Total I/O In: -  Out: 25 [Drains:25]  Physical Exam: Patient with trach on trach collar. She is resting comfortably in NAD. Eyes open spontaneously. Follows simple commands on the right. No purposeful movement on the left. Pupils 61m NR. EVD site is intact with good pulsatile flow.  Lab Results: No results for input(s): WBC, HGB, HCT, PLT in the last 72 hours. BMET Recent Labs    08/29/21 0514 08/30/21 0441  NA 137 136  K 3.7 4.0  CL 99 98  CO2 28 25  GLUCOSE 151* 148*  BUN 18 33*  CREATININE 3.65* 5.30*  CALCIUM 8.3* 8.4*    Studies/Results: No results found.  Assessment/Plan: 56year old female who presented to the ED with nausea, vomiting, and left-sided hemiparesis who was found to have a large thalamic bleed with intraventricular extension. While in the emergency department, her neurological status declined and required emergent intubation for airway protection. EVD placed on 08/23/2021 due to development of hydrocephalus. Follow up CT head after EVD placement revealed stable right thalamic bleed with a slight decrease in the lateral and third ventricles. Tracheostomy placed on 08/27/2021. Left hemiplegia persists. EVD continues to have good pulsatile flow. Plan for PEG tube this  week. She is tolerating the change in her EVD. Will continue EVD at 20 Cm H20.    -Continue EVD at 20 cm H20      LOS: 8 days     JMarvis Moeller DNP, AGNP-C Neurosurgery Nurse Practitioner  [facility] [address] [place], [place] [id number]: [phone]   F: [phone] 08/31/2021 8:02 AM

## 2021-08-31 NOTE — Progress Notes (Signed)
NAME:  Janet Mitchell, MRN:  [id number], DOB:  05/11/1965, LOS: 8 ADMISSION DATE:  08/23/2021, CONSULTATION DATE:  08/23/2021 REFERRING MD:  Dr. Rory Percy - Neuro CHIEF COMPLAINT: Code Stroke  History of Present Illness:  56 year old woman who presented to [facility] 5/29 with reported left-sided weakness, slurred speech, nausea/vomiting. LKN 6 hours prior to arrival. PMHx significant for HTN, CHF (Echo 07/2021 EF 60-65%, severe LVH, G2DD), T2DM (c/b diabetic neuropathy with subsequent L great toe amputation 2/2 ulcer/infection), ESRD (on HD MWF via [facility] TDC).  On arrival to ED, patient is somnolent with slight disconjugate gaze with near flaccid left upper arm and left leg weakness. BP 222/107. CT Head with 5cc acute hematoma in the right thalamus with intraventricular extension. Neurology consulted and patient transferred to [facility] for further evaluation. PCCM consulted for admission.  On arrival to unit patient is intubated, pupils 52m bilaterally and non-reactive. Withdrawals in all extremities, does not follow commands, on Propofol and Cleviprex gtt.    Pertinent Medical History:  ESRD with HD MWF, CHF, HTN, DM, s/p left great toe amputation secondary to ulcer/infection   Significant Hospital Events: Including procedures, antibiotic start and stop dates in addition to other pertinent events   [facility], more awake, following commands with RUE/RLE 6/1 No issues overnight, she remains alert and interactive on vent.  Extubated 6/2 failed extubation requiring reintubation with eventual establishment of tracheostomy by afternoon 6/4 Placed on ATC yesterday afternoon and has tolerated well since  Interim History / Subjective:   No overnight events.  Mrs. PAlmyra Deforestcontinues to endorse generalized pain but denies any localized pain.   Objective:  Blood pressure (!) 175/74, pulse 73, temperature 98.7 F (37.1 C), temperature source  Axillary, resp. rate (!) 23, height '5\' 7"'$  (1.702 m), weight 104.6 kg, SpO2 100 %.    FiO2 (%):  [28 %] 28 %   Intake/Output Summary (Last 24 hours) at 08/31/2021 0813 Last data filed at 08/31/2021 [id number] per 24 hour  Intake 1120 ml  Output 7026 ml  Net -5906 ml    Filed Weights   08/30/21 0500 08/30/21 1017 08/30/21 1351  Weight: 104 kg 106.8 kg 104.6 kg   Physical Examination:  General: Acutely ill appearing middle aged female now on ATC, in NAD HEENT: 6 cuffed trach midline, MM pink/moist, PERRL,  Neuro: Alert and awake. Answers questions appropriately.  CV: s1s2 regular rate and rhythm, no murmur, rubs, or gallops,  PULM: Clear to auscultation throughout. No increased work of breathing. Minimal secretions noted.  GI: soft, bowel sounds active in all 4 quadrants, non-tender, non-distended, tolerating TF Extremities: warm/dry, no edema  Skin: no rashes or lesions  Resolved Hospital Problem List:   Slight Hyperkalemia, K 6.0 Hypernatremia  Foot Ulcer, +Pseudomonas on recent culture   Assessment & Plan:   Right Thalamus ICH with IVH and Obstructive Hydrocephalus s/p EVD - Repeat MRI brain 5/31 No change in size of the right thalamic intraparenchymal hemorrhage is suspected, maximal dimension 3.6 cm by MRI.  - HTS stopped 6/1 P: Maintain neuro protective measures Nutrition and bowel regiment  Seizure precautions  Aspirations precautions  EVD per NSGY Continue with Cortrak at this time while awaiting CIR evaluation  Acute Hypoxic Respiratory Failure  Aspiration Pneumonia - Secondary to above  - Tracheostomy placed on 6/2 due to multiple failed extubations. Tolerating TC since 6/3 AM.  - S/p 5 days of IV Zosyn P:  Routine trach care  Head of bed elevated 30 degrees Minimize sedation  HTN Emergency  - Goal SBP < 160 per Stroke - Blood pressure stable and within goal per A-line. Cuff measurements consistently inaccurate.  P: Continue Norvasc, Clonidine, Hydralazine,  Losartan, and Labetalol Due to HD related hypotension, hold Hydralazine dose prior to HD Continuous telemetry   ESRD on HD MWF  P: Nephrology following for HD management Avoid nephrotoxins Ensure adequate renal perfusion   DM P: SSI q4h CBG goal 140-180 CBG checks q4hrs  Best Practice: (right click and "Reselect all SmartList Selections" daily)   Diet/type: NPO DVT prophylaxis: Heparin GI prophylaxis: N/A Lines: N/A Foley:  N/A Code Status:  full code Last date of multidisciplinary goals of care discussion [Per primary]   Dr. Jose Persia Internal Medicine PGY-3   08/31/2021, 8:13 AM

## 2021-09-01 ENCOUNTER — Encounter (HOSPITAL_COMMUNITY)
Admission: EM | Disposition: A | Discharge status: Skilled Nursing Facility | Payer: Self-pay | Source: Home / Self Care | Attending: Neurology

## 2021-09-01 DIAGNOSIS — I629 Nontraumatic intracranial hemorrhage, unspecified: Secondary | ICD-10-CM | POA: Diagnosis not present

## 2021-09-01 DIAGNOSIS — J9601 Acute respiratory failure with hypoxia: Secondary | ICD-10-CM | POA: Diagnosis not present

## 2021-09-01 LAB — CBC
HCT: 31.1 % — ABNORMAL LOW (ref 36.0–46.0)
Hemoglobin: 9.8 g/dL — ABNORMAL LOW (ref 12.0–15.0)
MCH: 24.7 pg — ABNORMAL LOW (ref 26.0–34.0)
MCHC: 31.5 g/dL (ref 30.0–36.0)
MCV: 78.3 fL — ABNORMAL LOW (ref 80.0–100.0)
Platelets: 124 10*3/uL — ABNORMAL LOW (ref 150–400)
RBC: 3.97 MIL/uL (ref 3.87–5.11)
RDW: 15.9 % — ABNORMAL HIGH (ref 11.5–15.5)
WBC: 8.1 10*3/uL (ref 4.0–10.5)
nRBC: 0 % (ref 0.0–0.2)

## 2021-09-01 LAB — BASIC METABOLIC PANEL
Anion gap: 16 — ABNORMAL HIGH (ref 5–15)
BUN: 38 mg/dL — ABNORMAL HIGH (ref 6–20)
CO2: 28 mmol/L (ref 22–32)
Calcium: 9.3 mg/dL (ref 8.9–10.3)
Chloride: 90 mmol/L — ABNORMAL LOW (ref 98–111)
Creatinine, Ser: 5.23 mg/dL — ABNORMAL HIGH (ref 0.44–1.00)
GFR, Estimated: 9 mL/min — ABNORMAL LOW (ref >60)
Glucose, Bld: 167 mg/dL — ABNORMAL HIGH (ref 70–99)
Potassium: 3.9 mmol/L (ref 3.5–5.1)
Sodium: 134 mmol/L — ABNORMAL LOW (ref 135–145)

## 2021-09-01 LAB — GLUCOSE, CAPILLARY
Glucose-Capillary: 153 mg/dL — ABNORMAL HIGH (ref 70–99)
Glucose-Capillary: 160 mg/dL — ABNORMAL HIGH (ref 70–99)
Glucose-Capillary: 177 mg/dL — ABNORMAL HIGH (ref 70–99)
Glucose-Capillary: 182 mg/dL — ABNORMAL HIGH (ref 70–99)
Glucose-Capillary: 223 mg/dL — ABNORMAL HIGH (ref 70–99)
Glucose-Capillary: 255 mg/dL — ABNORMAL HIGH (ref 70–99)

## 2021-09-01 SURGERY — INSERTION, PEG TUBE
Anesthesia: General

## 2021-09-01 NOTE — Progress Notes (Addendum)
Speech Language Pathology Treatment: Janet Mitchell Speaking valve  Patient Details Name: Janet Mitchell MRN: [id number] DOB: 07-26-1965 Today's Date: 09/01/2021 Time: 9381-0175 SLP Time Calculation (min) (ACUTE ONLY): 24 min  Assessment / Plan / Recommendation Clinical Impression  Pt was lethargic and able to stay awake for majority of session falling asleep after 20 min needing intermittent verbal and tactile cues. Mouthing prior to donning PMV, deflated minimal amount in cuff. Noted at baseline, pooled oral secretions, drooling, shallow and wet/congested respirations. Used donning/ doffing of valve and oropharyngeal suction to assist in secretion mobilization minimally effective. Unable to throat clear or cough given respiratory drive and decreased diaphragmatic function. Air was directed through mouth/nasal passage and audible verbalization x 2. Decreased intelligibility with pt's mouthing in short phrases. RR 18, HR 87 and SpO2 93%. She is significantly decompensated and will need time in combination with Speech therapy to assist in secretion management/clearance, optimal respiration coordinated with vocalization and duration of alertness. Presently she uses gestures in attempts to communicate which were not relevant or decipherable.   She in not ready for swallow assessment due to aformentioned.  ADDENDUM: as therapist documenting, pt heard with strong cough and found to have vomitted. Pt's head of bed was approximately 40 degrees. Therapist sat upright and suctioned oral cavity. Noted what appeared to be emesis from trach. Sats 87% for very brief period returning to 92%. RN notified.     HPI HPI: Patient is a 56 y.o. female with PMH: ESRD on HD MWF, CHF, HTN, DM who presented to [facility] as a code stroke with c/o nausea and vomiting upon waking up in the morning along with left sided weakness. She was communicative at [facility] but during stay awaiting transfer to [facility] she required emergent intubation due to  inability to protect airway. 6/1 CXR showed Diffuse bilateral lung opacities may be due to pulmonary edema or multifocal pneumonia. MRI brain showed right thalamic intraparenchymal hemorrhage  with surrounding edema. She was extubated on 6/1 and although initially tolerated well, quickly was seen with poor, ineffective cough, copious secretions unable to clear independently but in no obvious distress and oxygen saturations 100% on 4L nasal cannula. She was reintubated on 6/2 to tracheostomy #6 cuffed Shiley.      SLP Plan  Continue with current plan of care      Recommendations for follow up therapy are one component of a multi-disciplinary discharge planning process, led by the attending physician.  Recommendations may be updated based on patient status, additional functional criteria and insurance authorization.    Recommendations         Patient may use Passy-Muir Speech Valve: with SLP only PMSV Supervision: Full MD: Please consider changing trach tube to : Cuffless         General recommendations: Rehab consult Oral Care Recommendations: Oral care QID Follow Up Recommendations: Acute inpatient rehab (3hours/day) Assistance recommended at discharge: Frequent or constant Supervision/Assistance SLP Visit Diagnosis: Aphonia (R49.1) Plan: Continue with current plan of care           [name]  09/01/2021, 9:36 AM

## 2021-09-01 NOTE — Progress Notes (Addendum)
STROKE TEAM PROGRESS NOTE   INTERVAL HISTORY Patient is seen in her room with no family at the bedside.  She continues to do well on trach collar, and vital signs are stable.  EVD draining at 20 cm H2O. patient is working with speech therapy who plan swallow eval later today and if she does well we will hold off on the PEG tube. PT recommends discharge to CIR when ready.  Vital signs are stable.  Neurological exam is unchanged.  Vitals:   09/01/21 0758 09/01/21 0800 09/01/21 0828 09/01/21 1030  BP:  (!) 117/54  132/70  Pulse:  73 79 73  Resp:  '16 20 16  '$ Temp: 99.7 F (37.6 C)     TempSrc: Axillary     SpO2:  100% 100% 98%  Weight:      Height:       CBC:  Recent Labs  Lab 08/26/21 0423 08/27/21 0359 08/27/21 1123 08/28/21 0435  WBC 5.6  --   --  6.3  HGB 11.0*   < > 10.9* 10.2*  HCT 34.7*   < > 32.0* 33.3*  MCV 79.8*  --   --  79.7*  PLT 96*  --   --  89*   < > = values in this interval not displayed.    Basic Metabolic Panel:  Recent Labs  Lab 08/29/21 0514 08/30/21 0441  NA 137 136  K 3.7 4.0  CL 99 98  CO2 28 25  GLUCOSE 151* 148*  BUN 18 33*  CREATININE 3.65* 5.30*  CALCIUM 8.3* 8.4*  MG 1.7 1.8  PHOS 3.5 4.8*    Lipid Panel:  Recent Labs  Lab 08/28/21 0435  TRIG 174*    HgbA1c:  No results for input(s): HGBA1C in the last 168 hours.  Urine Drug Screen: No results for input(s): LABOPIA, COCAINSCRNUR, LABBENZ, AMPHETMU, THCU, LABBARB in the last 168 hours.  Alcohol Level  No results for input(s): ETH in the last 168 hours.   IMAGING past 24 hours No results found.  PHYSICAL EXAM  Physical Exam  Constitutional: Appears well-developed and well-nourished middle-age African-American lady.  Cardiovascular: Normal rate and regular rhythm.  Respiratory: Effort normal, non-labored breathing, on trach collar  Neuro -s/p tracheostomy without sedation, she is awake with eyes open with mild left ptosis, able to follow simple commands. Left gaze  preference but able to overcome. PERRL. Gag and cough present.  .  Face appears symmetrical.  Tongue protrusion midline.  Right side upper and lower extremity spontaneous movement, left upper and lower extremity hemiplegia with mild withdraw on the LLE with pain. Sensation, coordination not cooperative and gait not tested.   ASSESSMENT/PLAN Janet Mitchell is a 56 y.o. female with history of ESRD on HD MWF, CHF, HTN, DM presenting with nausea, vomiting, left side weakness present upon waking. Intubated emergently at [facility] and transferred to [facility] on 5/59.  NSGY consulted, EVD placement 5/29. MRI shows stable size in right thalamic hemorrhage with edema extending to the pons and midbrain. EVD still in place. Extubated 08/26/2021. Reintubated 08/27/2021 and then tracheostomy placed. Consult for PEG tube sent to trauma team.   ICH:  Right thalamic ICH extending to right midbrain with IVH and mild hydrocephalus s/p EVD, likely secondary to uncontrolled hypertension Code Stroke CT head Right thalamic IVH previously 2.9 x 1.7 x 3.1 cm 5/29 Head CT 1000- Interval significant increase in size of a an acute hematoma in the right thalamus, now measuring 3.6 x 2.5  x 3.8 cm. Small volume intraventricular extension of hemorrhage. Mild hydrocephalus. 5/29 Head CT 2200- decrease in size of lateral and third ventricles s/p EVD placement  CTA head & neck No arterial lesion underlying the right thalamic hematoma. Equivocal for spot sign which would be an adverse finding suggesting continued growth MRI  no change in size of the right thalamic intraparenchymal hemorrhage extending to midbrain, maximal dimension 3.6 cm by MRI. Surrounding edema, likely slightly more prominent. 2D Echo EF 60-65%, consistent with grade II diastolic dysfunction EEG - mild diffuse encephalopathy LDL 38 HgbA1c 6.2 VTE prophylaxis - SCDs aspirin 81 mg daily prior to admission, now on No antithrombotic.  Therapy recommendations:   CIR Disposition:  Pending  Obstructive Hydrocephalus NSGY on board 5/29- EVD placement  Follow up CT shows slightly decrease in size of lateral and third ventricle  Plan for increase level and attempt clamping soon.   Cerebral edema On 3% saline @ 75- d/c'd Given ESRD, try to avoid fluid overload, will d/c 3% saline Na goal 150-155 Na 142->146->141->147 -> 150-> 136  Acute Hypoxic Respiratory failure Intubated 5/29 CCM managing vent Extubated 6/1 Reintubated and s/p trach 6/2  Hypertensive emergency Home meds:  labetolol, hydralazine, losartan Stable BP less than 160 On losartan 100, labetalol 200 3 times daily, hydralazine 50 3 times daily Add amlodipine 10 Long-term BP goal normotensive Still on cleviprex, taper off as able  End Stage Renal Failure on hemodialysis MWF Nephrology consulted Dialysis done 5/30 Monitor BMP/electrolytes  Diabetes Mellitus A1c 6.2, controlled CBGs SSI Close PCP follow-up  Other Stroke Risk Factors Obesity, Body mass index is 34.32 kg/m., BMI >/= 30 associated with increased stroke risk, recommend weight loss, diet and exercise as appropriate  Congestive heart failure  Other Active Problems Hyperkalemia  K 6.1->4.2-> 3.5, s/p HD and Lokelma Dysphagia - cortrak placed on [facility] day # 9  Patient seen and examined by NP/APP with MD. MD to update note as needed.   Janine Ores, DNP, FNP-BC [facility] Pager: [phone]  I have personally obtained history,examined this patient, reviewed notes, independently viewed imaging studies, participated in medical decision making and plan of care.ROS completed by me personally and pertinent positives fully documented  I have made any additions or clarifications directly to the above note. Agree with note above.  Continue speech therapy evaluation and if patient does not do well on swallow test will consider PEG tube.  Mobilize out of bed.  Neurosurgery to wean  ventriculostomy to hopefully over the next few days.  No family available at the bedside for discussion.  Discussed with speech therapist and care team.This patient is critically ill and at significant risk of neurological worsening, death and care requires constant monitoring of vital signs, hemodynamics,respiratory and cardiac monitoring, extensive review of multiple databases, frequent neurological assessment, discussion with family, other specialists and medical decision making of high complexity.I have made any additions or clarifications directly to the above note.This critical care time does not reflect procedure time, or teaching time or supervisory time of PA/NP/Med Resident etc but could involve care discussion time.  I spent 30 minutes of neurocritical care time  in the care of  this patient.      Antony Contras, MD Medical Director [facility] Pager: [phone] 09/01/2021 11:00 AM   To contact Stroke Continuity provider, please refer to [email or web address] After hours, contact General Neurology

## 2021-09-01 NOTE — Progress Notes (Signed)
Inpatient Rehab Admissions Coordinator:    Pt. Not yet appropriate for CIR, continues with  EVD, possible PEG pending, cuffed trach.  I will follow for potential CIR admit pending medical readiness and bed availability.   Clemens Catholic, [place], [place] Admissions Coordinator  [phone] ([place]) [phone] (office)

## 2021-09-01 NOTE — Progress Notes (Signed)
Patient ID: Janet Mitchell, female   DOB: Aug 16, 1965, 56 y.o.   MRN: [id number] [facility] Progress Note   Assessment/ Plan:   1.  Right thalamic intracranial hemorrhage: With intraventricular hemorrhage and hydrocephalus and now status post ventriculostomy drain placement by neurosurgery with ongoing active monitoring by neurology.  2. ESRD: She is usually on a Monday/Wednesday/Friday dialysis schedule and earlier this week underwent frequent hemodialysis/ultrafiltration to help improve volume status/respiratory status.  On schedule for dialysis today. 3. Anemia: Hemoglobin and hematocrit currently acceptable, continue to follow trend to decide on need to restart ESA. 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 5. Nutrition: On tube feeds at this time and unclear if will get PEG tube or will continue with tube feeds via Cortrak 6. Hypertension: Blood pressure marginally low, continue to monitor on current antihypertensive therapy and hemodialysis.  Subjective:   Rising temperature overnight/this morning to 99.7 with episode of vomiting earlier.   Objective:   BP (!) 87/61   Pulse 79   Temp 99.7 F (37.6 C) (Axillary)   Resp 20   Ht '5\' 7"'$  (1.702 m)   Wt 99.4 kg   SpO2 100%   BMI 34.32 kg/m   Physical Exam: Gen: Actively vomiting while being seen-nursing staff assisting with care, ventriculostomy drain in situ with serosanguineous collection CVS: Pulse regular rhythm, normal rate, S1 and S2 normal Resp: On trach collar, anteriorly clear to auscultation, no rales/rhonchi Abd: Soft, obese, nontender, bowel sounds normal Ext: Trace-1+ lower extremity edema, left upper arm AV fistula with intact dressings and palpable thrill  Labs: BMET Recent Labs  Lab 08/26/21 0423 08/26/21 0819 08/27/21 0359 08/27/21 0840 08/27/21 1123 08/28/21 0435 08/29/21 0514 08/30/21 0441  NA 147* 150* 151* 151* 150* 140 137 136  K  3.5  --  3.8 4.0 3.7 3.1* 3.7 4.0  CL 111  --   --  114*  --  102 99 98  CO2 27  --   --  26  --  '27 28 25  '$ GLUCOSE 108*  --   --  89  --  111* 151* 148*  BUN 21*  --   --  32*  --  19 18 33*  CREATININE 3.91*  --   --  6.41*  --  4.05* 3.65* 5.30*  CALCIUM 8.4*  --   --  7.9*  --  8.2* 8.3* 8.4*  PHOS 3.1  --   --   --   --  4.0 3.5 4.8*   CBC Recent Labs  Lab 08/26/21 0423 08/27/21 0359 08/27/21 1123 08/28/21 0435  WBC 5.6  --   --  6.3  HGB 11.0* 11.2* 10.9* 10.2*  HCT 34.7* 33.0* 32.0* 33.3*  MCV 79.8*  --   --  79.7*  PLT 96*  --   --  89*    Medications:     amantadine  200 mg Per Tube Weekly   amLODipine  10 mg Per Tube Daily   calcitRIOL  1 mcg Per Tube Q M,W,F   chlorhexidine  15 mL Mouth Rinse BID   Chlorhexidine Gluconate Cloth  6 each Topical Q0600   cloNIDine  0.1 mg Per Tube TID   fentaNYL (SUBLIMAZE) injection  200 mcg Intravenous Once   fiber  1 packet Per Tube BID   heparin injection (subcutaneous)  5,000 Units Subcutaneous Q8H   hydrALAZINE  100 mg Per Tube Q8H   insulin aspart  0-6 Units Subcutaneous Q4H   labetalol  200 mg Per Tube Q8H   losartan  100 mg Per Tube Daily   mouth rinse  15 mL Mouth Rinse q12n4p   multivitamin  1 tablet Per Tube QHS   Elmarie Shiley, MD 09/01/2021, 9:21 AM

## 2021-09-01 NOTE — Progress Notes (Signed)
Received patient in ICU  bed, alert, nonverbal . Informed consent signed and in chart.  Time tx initiated:  Pre HD weight:  Pre HD VS:  Time tx completed:20:45  HD treatment completed. Patient tolerated well. Fistula without signs and symptoms of complications. Patient id in ICU alert, nonverbal, and in no acute distress. Report given to Huey Bienenstock, RN.  Total UF removed: 2kg  Medication given:none  Post HD VS: 123/71, 81, 20, 99.2   Post HD weight:

## 2021-09-01 NOTE — Progress Notes (Signed)
PT Cancellation Note  Patient Details Name: Janet Mitchell MRN: [id number] DOB: 09-06-1965   Cancelled Treatment:    Reason Eval/Treat Not Completed: Patient at procedure or test/unavailable. Pt preparing to receive dialysis in room. PT will follow up tomorrow.   Zenaida Niece 09/01/2021, 5:04 PM

## 2021-09-01 NOTE — Progress Notes (Signed)
Pt vomited again. MDs CCM notified. Ordered to reopen drain.

## 2021-09-01 NOTE — Progress Notes (Signed)
NAME:  Janet Mitchell, MRN:  [id number], DOB:  07/01/1965, LOS: 9 ADMISSION DATE:  08/23/2021, CONSULTATION DATE:  08/23/2021 REFERRING MD:  Dr. Rory Percy - Neuro CHIEF COMPLAINT: Code Stroke  History of Present Illness:  56 year old woman who presented to [facility] 5/29 with reported left-sided weakness, slurred speech, nausea/vomiting. LKN 6 hours prior to arrival. PMHx significant for HTN, CHF (Echo 07/2021 EF 60-65%, severe LVH, G2DD), T2DM (c/b diabetic neuropathy with subsequent L great toe amputation 2/2 ulcer/infection), ESRD (on HD MWF via RIJ TDC).  On arrival to ED, patient is somnolent with slight disconjugate gaze with near flaccid left upper arm and left leg weakness. BP 222/107. CT Head with 5cc acute hematoma in the right thalamus with intraventricular extension. Neurology consulted and patient transferred to [facility] for further evaluation. PCCM consulted for admission.  On arrival to unit patient is intubated, pupils 68m bilaterally and non-reactive. Withdrawals in all extremities, does not follow commands, on Propofol and Cleviprex gtt.    Pertinent Medical History:  ESRD with HD MWF, CHF, HTN, DM, s/p left great toe amputation secondary to ulcer/infection   Significant Hospital Events: Including procedures, antibiotic start and stop dates in addition to other pertinent events   [facility], more awake, following commands with RUE/RLE 6/1 No issues overnight, she remains alert and interactive on vent.  Extubated 6/2 failed extubation requiring reintubation with eventual establishment of tracheostomy by afternoon 6/4 Placed on ATC yesterday afternoon and has tolerated well since 6/5: EVD raised to 20 cm H20  Interim History / Subjective:   No overnight events.  This AM at approximately 730, patient's EVD was clamped.  On my initial evaluation, patient is awake and answering questions appropriately; she is working  with SLP.    Patient reevaluated after 2 episodes of emesis.  RN notes she suspects an aspiration event occurred.  Patient's oxygen saturation decreased from 100% to 92%.  In addition, at that time, Mrs. PAlmyra Deforestwas endorsing headache but denies nausea.  Objective:  Blood pressure (!) 87/61, pulse 71, temperature 100.1 F (37.8 C), temperature source Axillary, resp. rate 14, height '5\' 7"'$  (1.702 m), weight 99.4 kg, SpO2 100 %.    FiO2 (%):  [28 %] 28 %   Intake/Output Summary (Last 24 hours) at 09/01/2021 0757 Last data filed at 09/01/2021 0656 Gross per 24 hour  Intake 506.33 ml  Output 2123 ml  Net -1616.67 ml    Filed Weights   08/30/21 1351 08/31/21 1751 08/31/21 2051  Weight: 104.6 kg 100.3 kg 99.4 kg   Physical Examination:  General: Acutely ill appearing middle aged female now on ATC, in NAD HEENT: 6 cuffed trach midline, MM pink/moist, PERRL,  Neuro: Alert and awake. Answers questions appropriately.  Able to speak today in small whispers. CV: s1s2 regular rate and rhythm, no murmur, rubs, or gallops,  PULM: Clear to auscultation throughout. No increased work of breathing. Minimal secretions noted.  GI: soft, bowel sounds active in all 4 quadrants, non-tender, non-distended, tolerating TF Extremities: warm/dry, no edema  Skin: no rashes or lesions  Resolved Hospital Problem List:   Slight Hyperkalemia, K 6.0 Hypernatremia  Foot Ulcer, +Pseudomonas on recent culture HTN Emergency   Assessment & Plan:   Right Thalamus ICH with IVH and Obstructive Hydrocephalus s/p EVD - Repeat MRI brain 5/31 No change in size of the right thalamic intraparenchymal hemorrhage is suspected, maximal dimension 3.6 cm by MRI.  -  HTS stopped 6/1 -EVD clamped this a.m., however patient subsequently developed headache and emesis.  EVD opened to 20 cm H2O and neurosurgery contacted. P: Maintain neuro protective measures Nutrition and bowel regiment  Seizure precautions  Aspirations precautions   EVD per NSGY Continue with Cortrak at this time while awaiting CIR evaluation  Acute Hypoxic Respiratory Failure  Aspiration Pneumonia - Secondary to above  - Tracheostomy placed on 6/2 due to multiple failed extubations. Tolerating TC since 6/3 AM.  - S/p 5 days of IV Zosyn - Potential aspiration event on the morning of 6/7.  I suspect patient may develop aspiration pneumonitis.  Hold off on antibiotics for now unless patient develops fever or worsening respiratory status. P: Routine trach care  Head of bed elevated 30 degrees Minimize sedation  Hypertension  - Goal SBP < 160 per Stroke - Blood pressure stable and within goal per A-line. Cuff measurements consistently inaccurate.  P: Continue Norvasc, Clonidine, Hydralazine, Losartan, and Labetalol Due to HD related hypotension, hold Hydralazine dose prior to HD Continuous telemetry   ESRD on HD MWF  P: Nephrology following for HD management Avoid nephrotoxins Ensure adequate renal perfusion   DM P: SSI q4h CBG goal 140-180 CBG checks q4hrs  Best Practice: (right click and "Reselect all SmartList Selections" daily)   Diet/type: NPO DVT prophylaxis: Heparin GI prophylaxis: N/A Lines: N/A Foley:  N/A Code Status:  full code Last date of multidisciplinary goals of care discussion [Per primary]   Dr. Jose Persia Internal Medicine PGY-3   09/01/2021, 7:57 AM

## 2021-09-01 NOTE — Progress Notes (Signed)
Pt vomited large amt after PMV speaking trial. MD Agarwala and Neurosurgery team notifed. Pt bathed, mouth care done, suctioned. Still following commands readily on Rt.

## 2021-09-01 NOTE — Progress Notes (Signed)
Subjective: Patient reports opens eyes to voice. Following commands on the right. No acute events overnight.  Objective: Vital signs in last 24 hours: Temp:  [98.6 F (37 C)-100.1 F (37.8 C)] 100.1 F (37.8 C) (06/07 0400) Pulse Rate:  [73-77] 76 (06/07 0600) Resp:  [0-25] 18 (06/07 0600) BP: (101-168)/(49-109) 116/65 (06/07 0600) SpO2:  [97 %-100 %] 100 % (06/07 0600) Arterial Line BP: (157-184)/(72-176) 184/176 (06/06 1000) FiO2 (%):  [28 %] 28 % (06/06 1535) Weight:  [99.4 kg-100.3 kg] 99.4 kg (06/06 2051)  Intake/Output from previous day: 06/06 0701 - 06/07 0700 In: 506.3 [NG/GT:506.3] Out: 2148 [Drains:248; Stool:200] Intake/Output this shift: No intake/output data recorded.  Physical Exam: Patient with trach on trach collar. She is resting comfortably in NAD. Eyes open spontaneously. Follows simple commands on the right. No purposeful movement on the left. Pupils 17m NR. EVD site is intact with good pulsatile flow.    Lab Results: No results for input(s): WBC, HGB, HCT, PLT in the last 72 hours. BMET Recent Labs    08/30/21 0441  NA 136  K 4.0  CL 98  CO2 25  GLUCOSE 148*  BUN 33*  CREATININE 5.30*  CALCIUM 8.4*    Studies/Results: No results found.  Assessment/Plan: 56year old female who presented to the ED with nausea, vomiting, and left-sided hemiparesis who was found to have a large thalamic bleed with intraventricular extension. While in the emergency department, her neurological status declined and required emergent intubation for airway protection. EVD placed on 08/23/2021 due to development of hydrocephalus. Follow up CT head after EVD placement revealed stable right thalamic bleed with a slight decrease in the lateral and third ventricles. Tracheostomy placed on 08/27/2021. Plan for PEG tube this week. Left hemiplegia persists. Neuro exam is stable. EVD with good pulsatile flow. She tolerated the change in her EVD well. Will plan to clamp the drain this  morning with follow up CSabinain the morning. If her follow up scan is stable, will likely be ready for removal of the EVD on Friday.    -Clamp EVD today with follow up CDuluthin the morning.     LOS: 9 days     JMarvis Moeller DNP, AGNP-C Neurosurgery Nurse Practitioner  [facility] [address], [place] [id number]: [phone]   F: [phone] 09/01/2021 7:19 AM

## 2021-09-02 ENCOUNTER — Inpatient Hospital Stay (HOSPITAL_COMMUNITY): Payer: 59

## 2021-09-02 DIAGNOSIS — J9601 Acute respiratory failure with hypoxia: Secondary | ICD-10-CM | POA: Diagnosis not present

## 2021-09-02 DIAGNOSIS — I629 Nontraumatic intracranial hemorrhage, unspecified: Secondary | ICD-10-CM | POA: Diagnosis not present

## 2021-09-02 LAB — CBC WITH DIFFERENTIAL/PLATELET
Abs Immature Granulocytes: 0.07 10*3/uL (ref 0.00–0.07)
Basophils Absolute: 0.1 10*3/uL (ref 0.0–0.1)
Basophils Relative: 1 %
Eosinophils Absolute: 0.2 10*3/uL (ref 0.0–0.5)
Eosinophils Relative: 2 %
HCT: 32.8 % — ABNORMAL LOW (ref 36.0–46.0)
Hemoglobin: 10.4 g/dL — ABNORMAL LOW (ref 12.0–15.0)
Immature Granulocytes: 1 %
Lymphocytes Relative: 11 %
Lymphs Abs: 1.2 10*3/uL (ref 0.7–4.0)
MCH: 24.7 pg — ABNORMAL LOW (ref 26.0–34.0)
MCHC: 31.7 g/dL (ref 30.0–36.0)
MCV: 77.9 fL — ABNORMAL LOW (ref 80.0–100.0)
Monocytes Absolute: 1.6 10*3/uL — ABNORMAL HIGH (ref 0.1–1.0)
Monocytes Relative: 15 %
Neutro Abs: 7.6 10*3/uL (ref 1.7–7.7)
Neutrophils Relative %: 70 %
Platelets: 128 10*3/uL — ABNORMAL LOW (ref 150–400)
RBC: 4.21 MIL/uL (ref 3.87–5.11)
RDW: 16 % — ABNORMAL HIGH (ref 11.5–15.5)
WBC: 10.6 10*3/uL — ABNORMAL HIGH (ref 4.0–10.5)
nRBC: 0 % (ref 0.0–0.2)

## 2021-09-02 LAB — BASIC METABOLIC PANEL
Anion gap: 17 — ABNORMAL HIGH (ref 5–15)
BUN: 36 mg/dL — ABNORMAL HIGH (ref 6–20)
CO2: 26 mmol/L (ref 22–32)
Calcium: 9.3 mg/dL (ref 8.9–10.3)
Chloride: 91 mmol/L — ABNORMAL LOW (ref 98–111)
Creatinine, Ser: 4.62 mg/dL — ABNORMAL HIGH (ref 0.44–1.00)
GFR, Estimated: 11 mL/min — ABNORMAL LOW (ref >60)
Glucose, Bld: 221 mg/dL — ABNORMAL HIGH (ref 70–99)
Potassium: 4.1 mmol/L (ref 3.5–5.1)
Sodium: 134 mmol/L — ABNORMAL LOW (ref 135–145)

## 2021-09-02 LAB — GLUCOSE, CAPILLARY
Glucose-Capillary: 291 mg/dL — ABNORMAL HIGH (ref 70–99)
Glucose-Capillary: 256 mg/dL — ABNORMAL HIGH (ref 70–99)
Glucose-Capillary: 222 mg/dL — ABNORMAL HIGH (ref 70–99)
Glucose-Capillary: 106 mg/dL — ABNORMAL HIGH (ref 70–99)
Glucose-Capillary: 72 mg/dL (ref 70–99)
Glucose-Capillary: 115 mg/dL — ABNORMAL HIGH (ref 70–99)

## 2021-09-02 IMAGING — DX DG ABDOMEN 1V
1 series · 2 of 2 positions shown · non-contrast
Comparison: [DATE]

CLINICAL DATA: Feeding tube placement.

EXAM:
ABDOMEN - 1 VIEW

[Series 1: abdomen · 0.14mm/px · 2 of 2 slices shown]
[im 1/2]
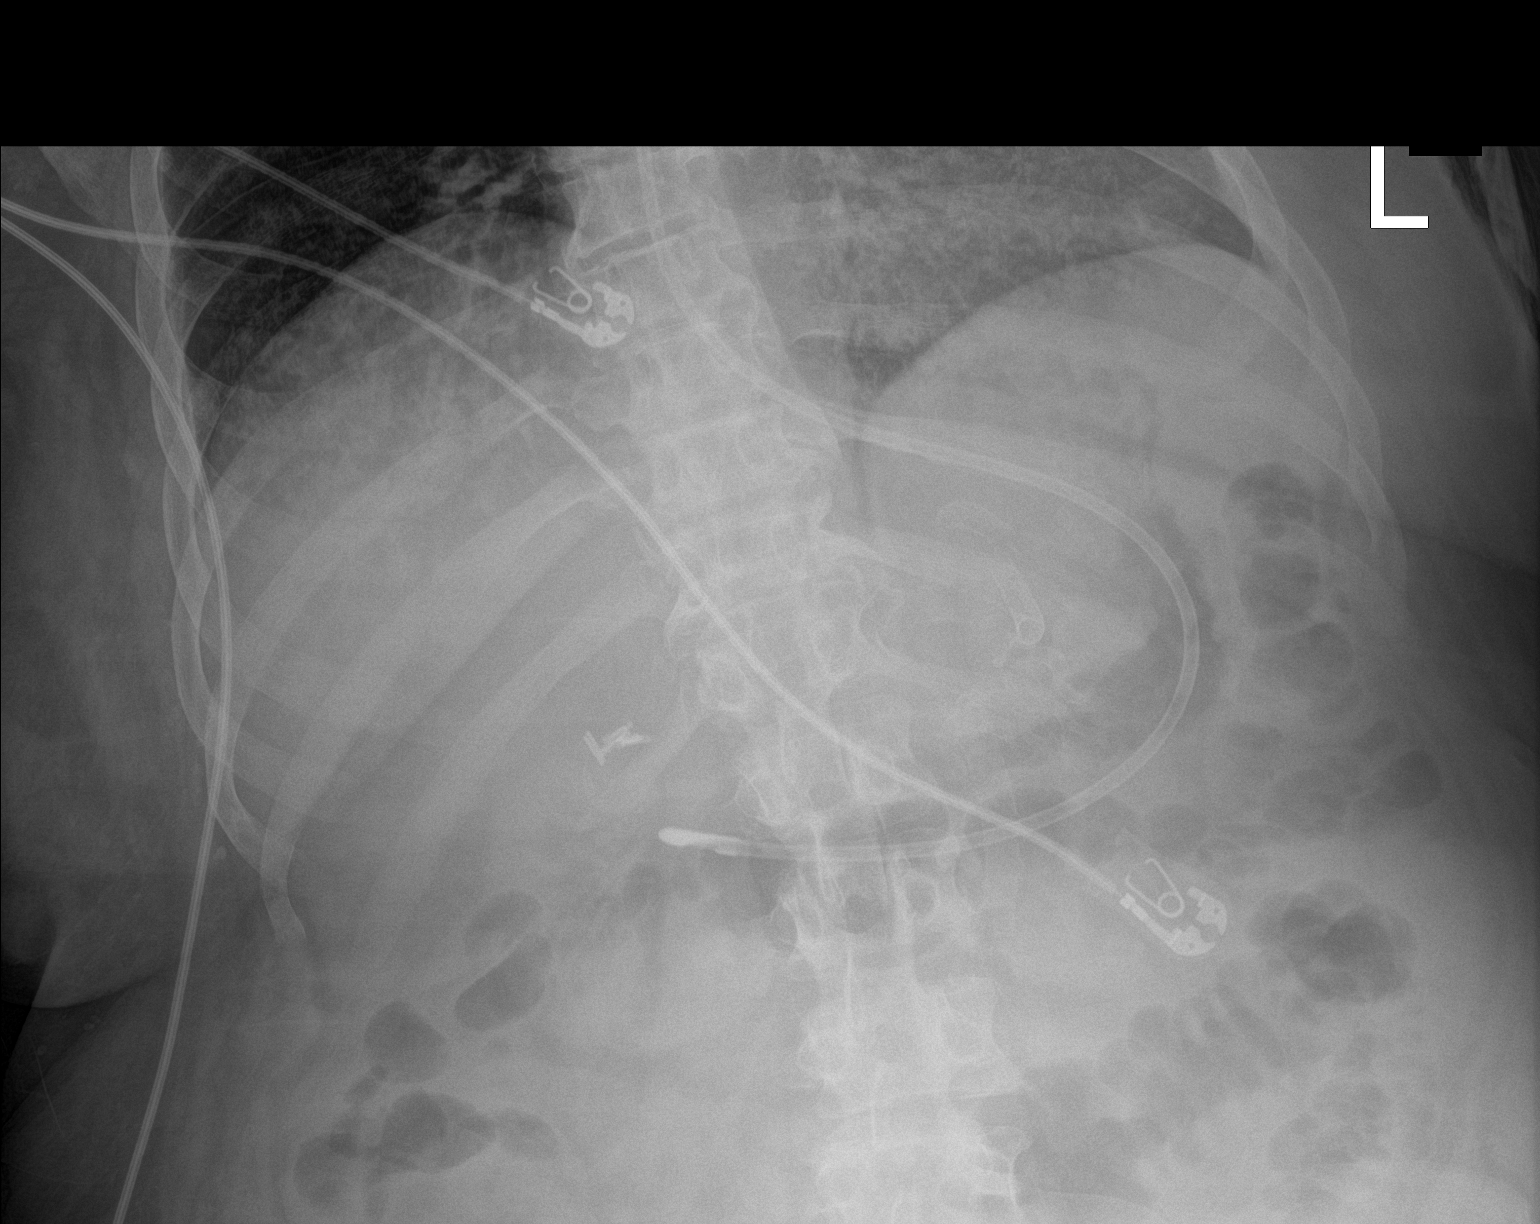
[im 2/2]
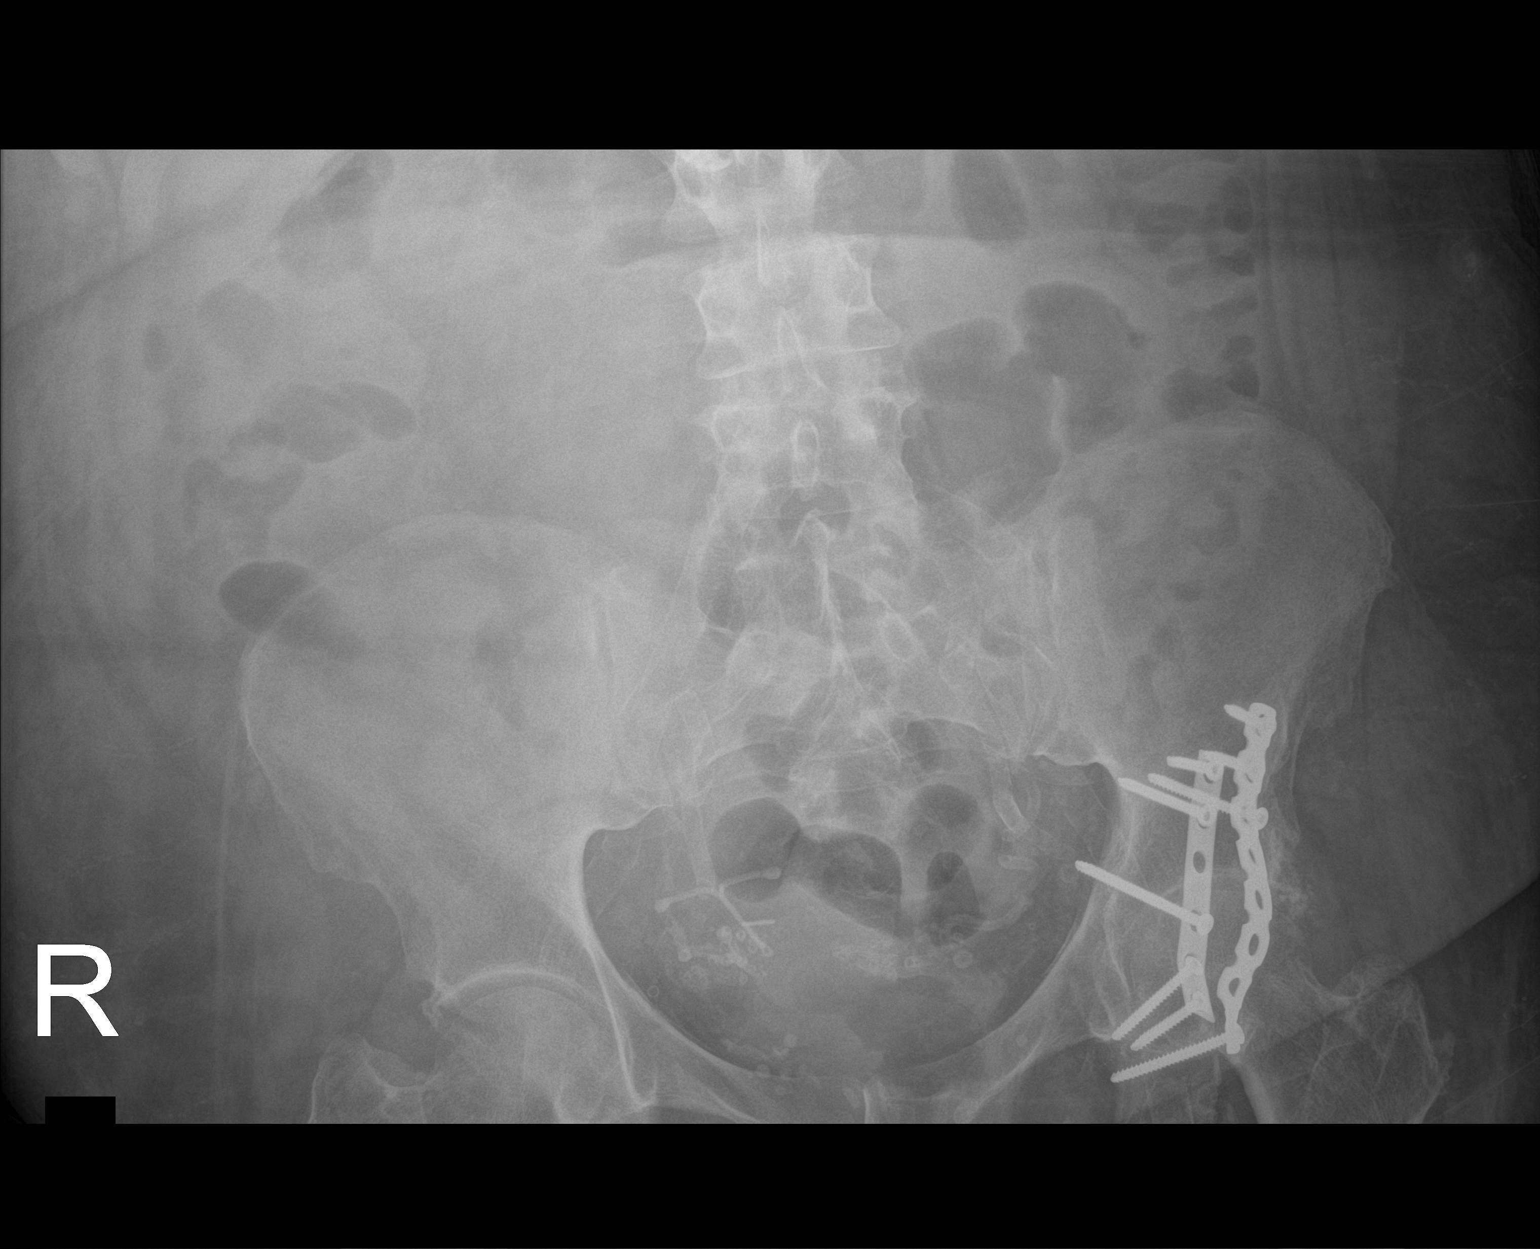

[2 of 2 positions shown; findings below may reference images not displayed]

FINDINGS: [8R] hours. Feeding tube tip is in the distal stomach at the pylorus
and directed distally towards the duodenum. Nonspecific bowel gas
pattern noted in the visualized upper abdomen.
IMPRESSION: Feeding tube tip is in the distal stomach at the pylorus and
directed towards the duodenum.

## 2021-09-02 IMAGING — CT CT HEAD W/O CM
2 of 4 series · 11 of 37 positions shown, 13 images · non-contrast
Comparison: [DATE] CT, correlation is also made with [DATE]
MRI head

CLINICAL DATA: Stroke suspected



[Series 6: sag soft · sagittal · 0.37mm/px · 3 of 62 slices shown]
[im 21/62  brain]
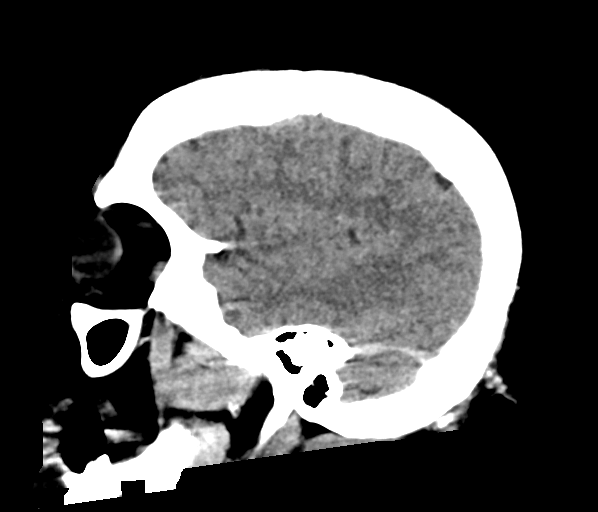
[im 31/62  brain]
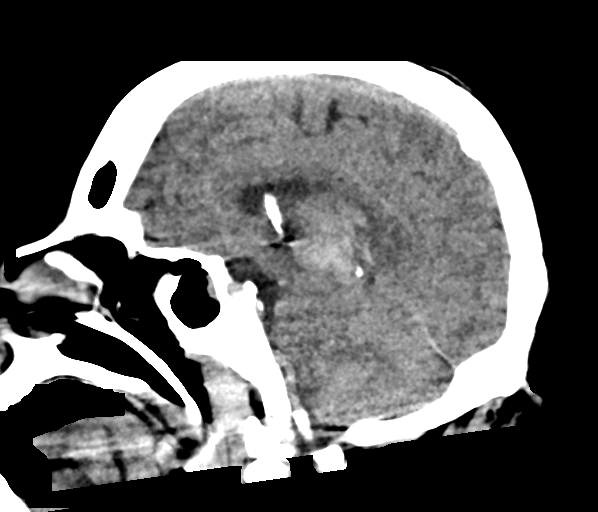
[im 41/62  brain]
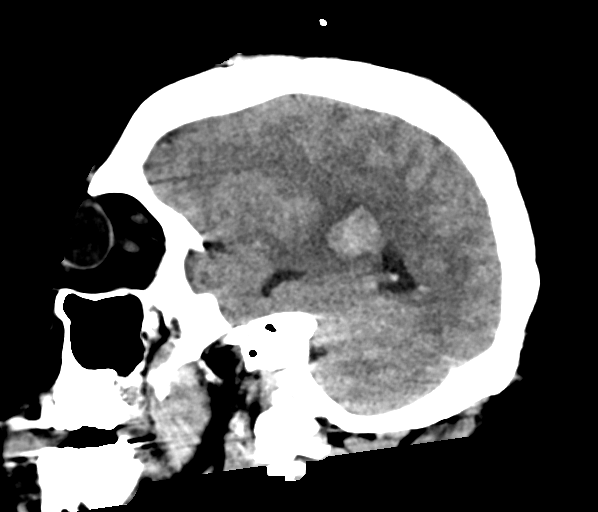

[Series 7: true axial · axial · 0.38mm/px · z∈[-167,-34]mm · 8 of 65 slices shown, 10 images]
[im 8/65  brain]
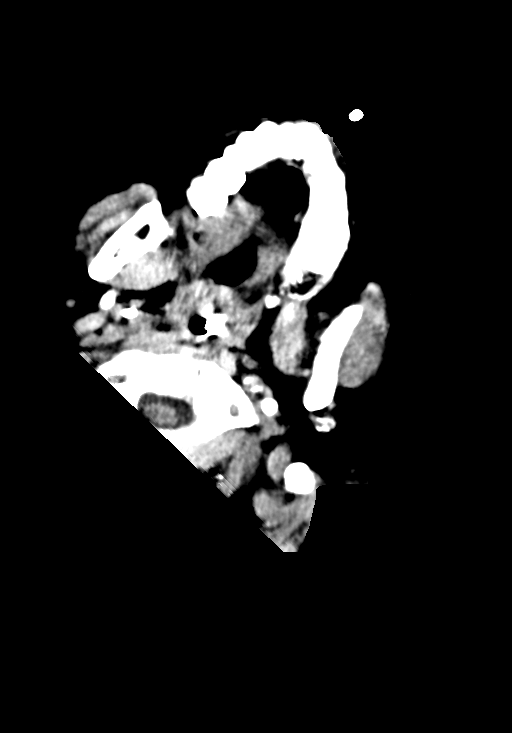
[im 8/65  bone]
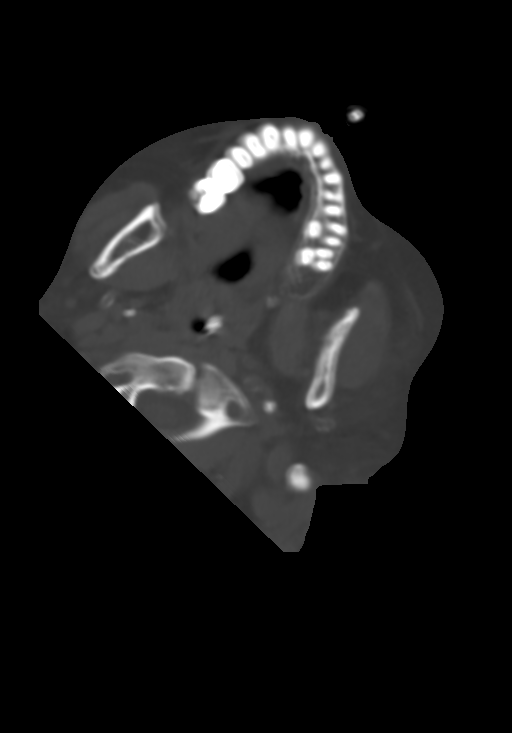
[im 15/65  brain]
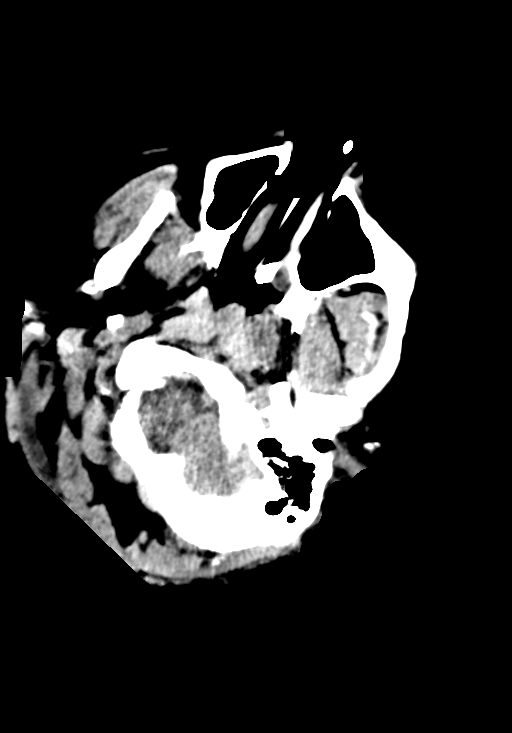
[im 22/65  brain]
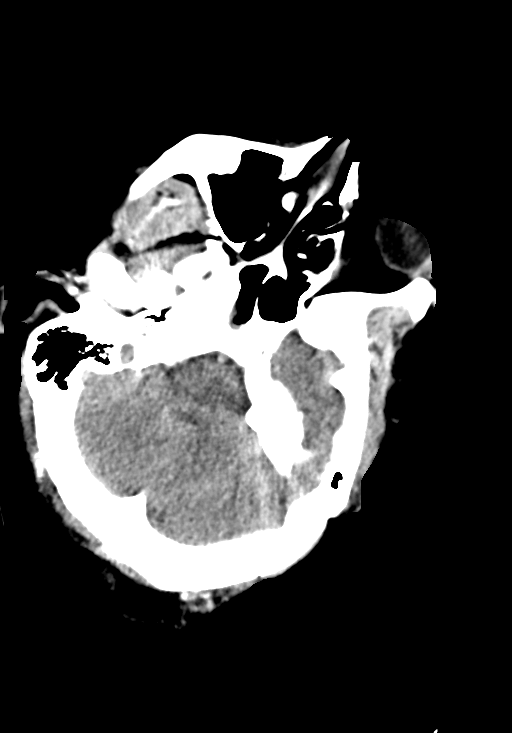
[im 29/65  brain]
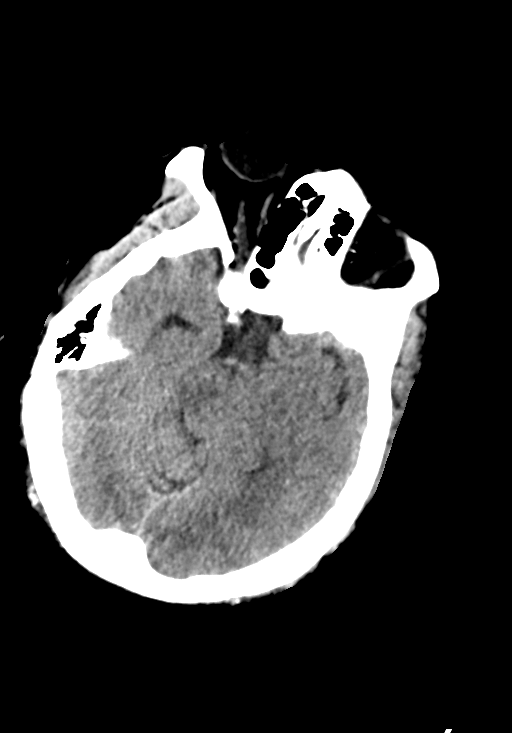
[im 36/65  brain]
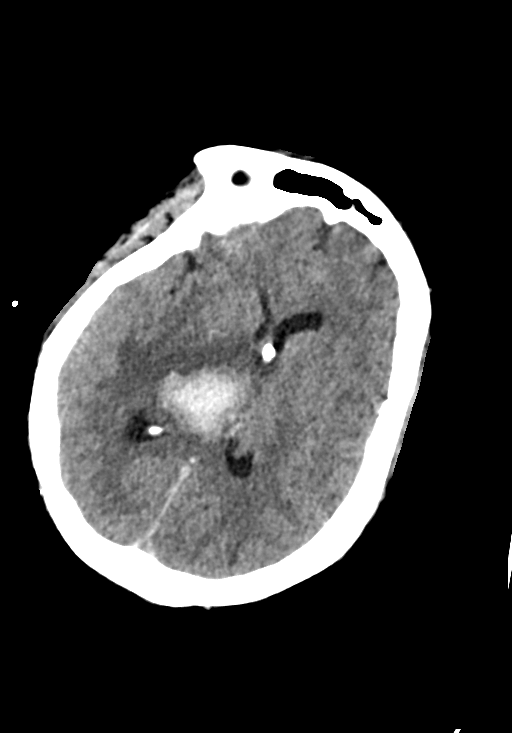
[im 36/65  bone]
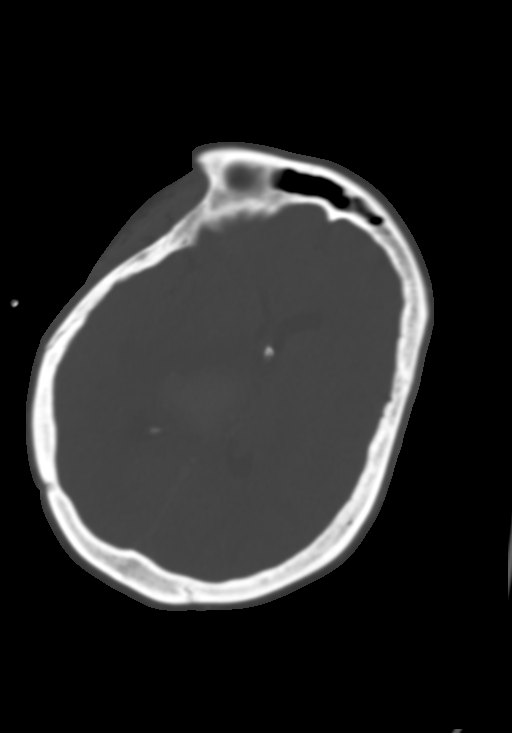
[im 43/65  brain]
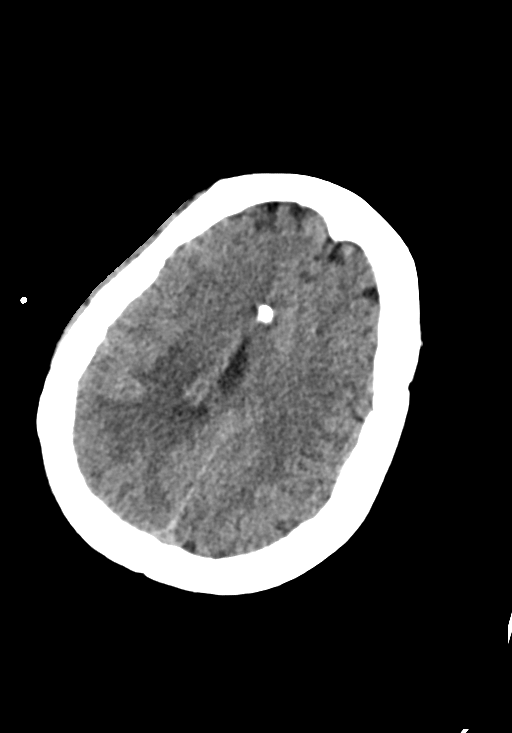
[im 50/65  brain]
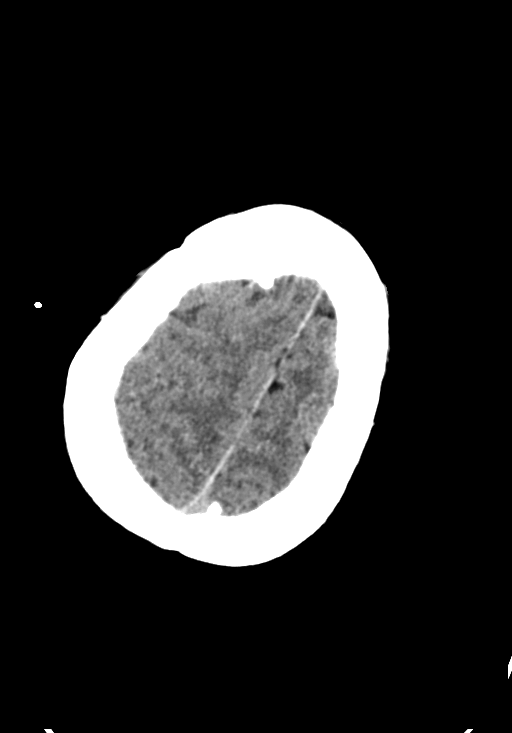
[im 57/65  brain]
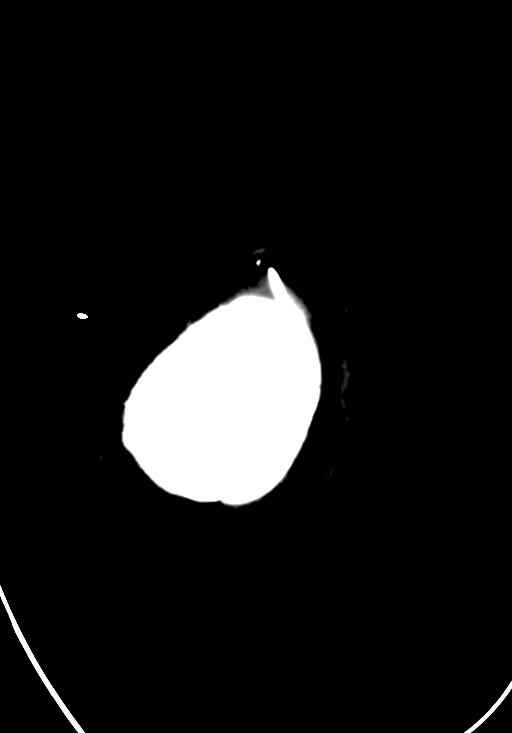

[11 of 37 positions shown; findings below may reference images not displayed]

FINDINGS: Brain: Redemonstrated hematoma centered in the right thalamus, which
measures 2.4 x 3.7 x 3.8 cm (AP x TR x CC) (series 7, image 28 and
series 5, image 50), previously 2.3 x 3.7 x 3.8 cm when remeasured
similarly. Increased hypodensity around the hemorrhage, likely
increased edema compared to [DATE] but overall similar to the
[DATE] MRI. No evidence of acute large vascular territory
infarct.

Previously noted intraventricular hemorrhage is not definitively
seen on the current exam. Unchanged mass effect on the right lateral
ventricle and third ventricle, with right frontal approach
ventriculostomy catheter in unchanged position, terminating in the
left basal ganglia. Overall unchanged size of the ventricles.

Vascular: No hyperdense vessel. Atherosclerotic calcifications in
the intracranial carotid and vertebral arteries.

Skull: Right frontal burr hole.  No acute osseous abnormality.

Sinuses/Orbits: Mucosal thickening and bubbly fluid in the sphenoid
sinuses. Otherwise clear.

Other: The mastoids are well aerated.
IMPRESSION: 1. Overall unchanged size of the hematoma centered in the right
thalamus. Surrounding edema is increased compared to the prior CT
but appears similar to the [DATE] MRI when accounting for
differences in technique.
2. Overall unchanged size and configuration of the shunted
ventricular system.

## 2021-09-02 IMAGING — DX DG CHEST 1V
1 series · 1 of 1 positions shown · non-contrast
Comparison: AP chest [DATE]

CLINICAL DATA: Respiratory abnormality.

EXAM:
CHEST  1 VIEW

[chest]
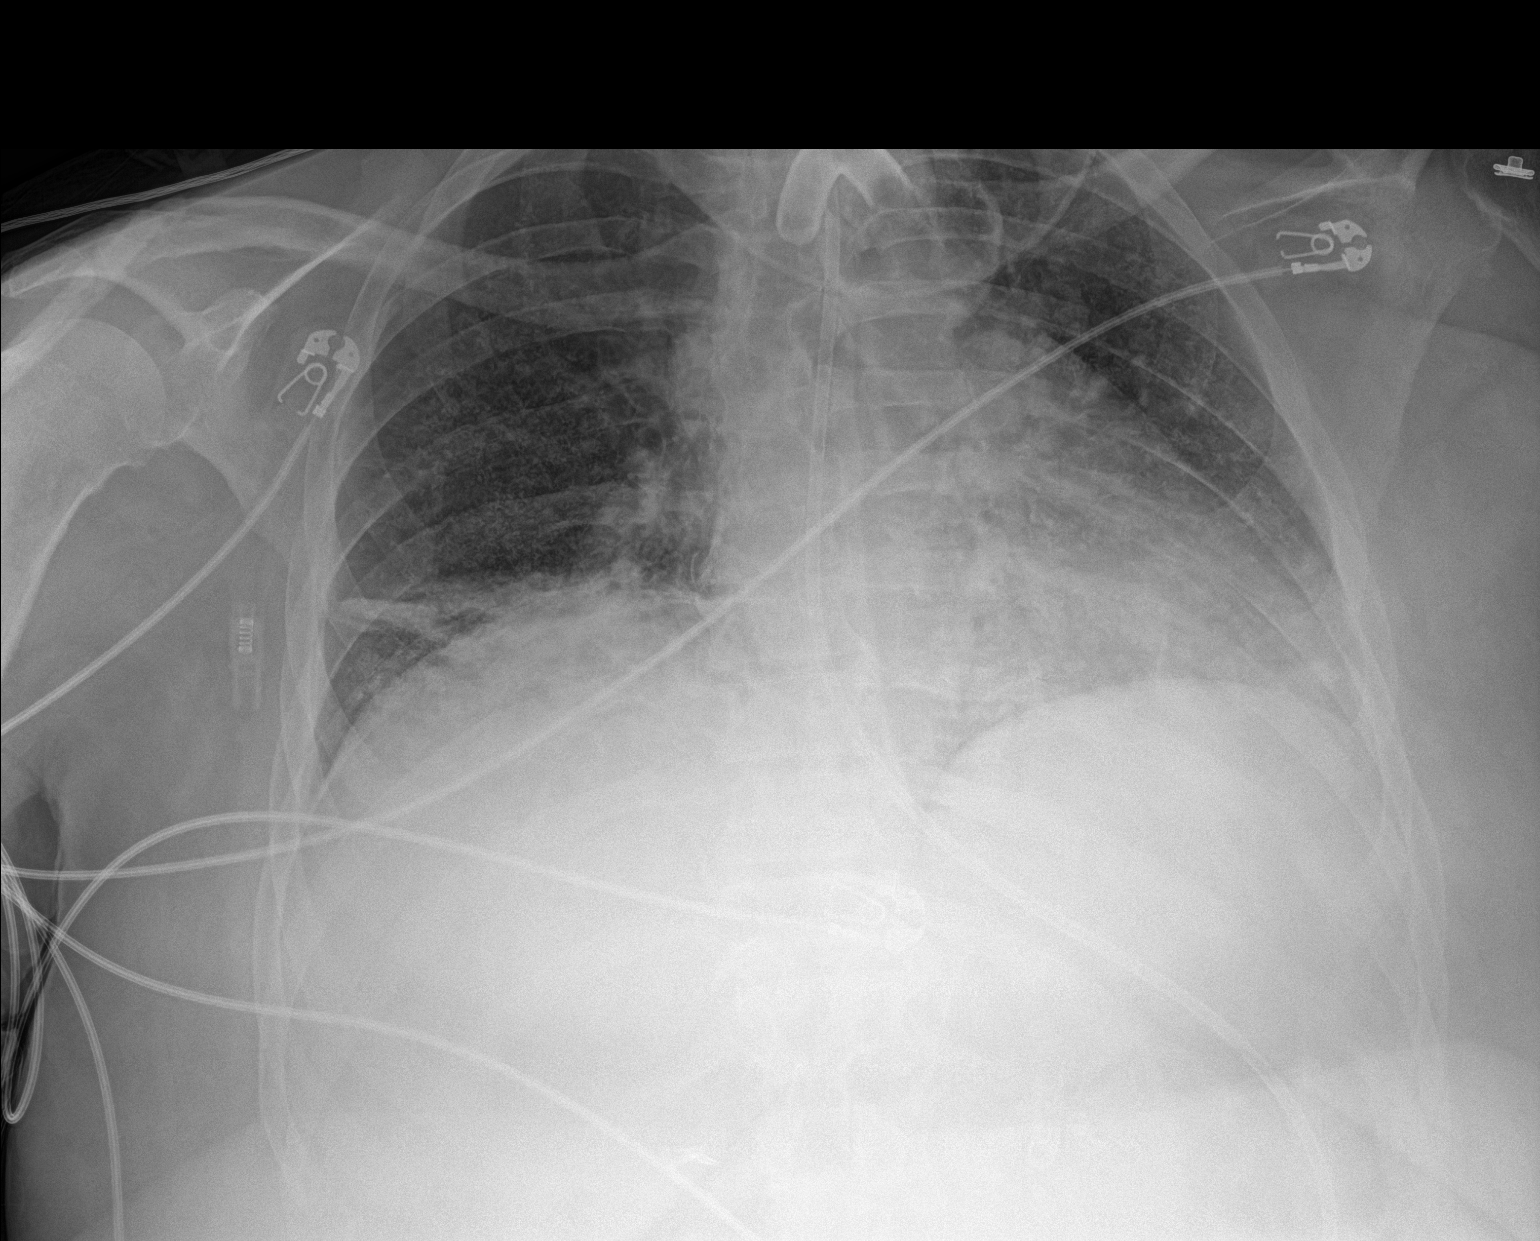

[1 of 1 positions shown; findings below may reference images not displayed]

FINDINGS: Tracheostomy tube overlies the midline trachea. Mildly decreased
lung volumes. Cardiac silhouette and mediastinal contours are
unchanged. Enteric tube again descends below the diaphragm with the
tip excluded by collimation. Right lower lung horizontal linear
likely platelike atelectasis is new from prior. Bibasilar
heterogeneous airspace opacities are similar to prior. No
pneumothorax is seen. Cholecystectomy clips.
IMPRESSION: 1. Tracheostomy tube in appropriate position.
2. There are again moderately decreased lung volumes. Heterogeneous
bilateral basilar airspace opacities may represent atelectasis
pulmonary edema or pneumonia. No significant interval change.

## 2021-09-02 MED ORDER — INSULIN ASPART 100 UNIT/ML IJ SOLN
5.0000 [IU] | INTRAMUSCULAR | Status: DC
  Administered 2021-09-02 – 2021-09-06 (×20): 5 [IU] via SUBCUTANEOUS

## 2021-09-02 MED ORDER — ONDANSETRON HCL 4 MG/2ML IJ SOLN
4.0000 mg | Freq: Once | INTRAMUSCULAR | Status: AC
  Administered 2021-09-02: 4 mg via INTRAVENOUS
  Filled 2021-09-02: qty 2

## 2021-09-02 MED ORDER — ONDANSETRON HCL 4 MG/2ML IJ SOLN
4.0000 mg | Freq: Three times a day (TID) | INTRAMUSCULAR | Status: DC | PRN
Start: 2021-09-02 — End: 2021-10-05

## 2021-09-02 MED ORDER — DEXTROSE 10 % IV SOLN
INTRAVENOUS | Status: DC
  Filled 2021-09-02: qty 1000

## 2021-09-02 NOTE — Progress Notes (Signed)
SLP Cancellation Note  Patient Details Name: Janet Mitchell MRN: [id number] DOB: Jun 13, 1965   Cancelled treatment:        As therapist arrived pt had vomited and sleeping. Will continue efforts.    [name] 09/02/2021, 2:02 PM

## 2021-09-02 NOTE — Progress Notes (Signed)
Subjective: Patient reports increased headaches with nausea and vomiting shortly after EVD was clamped yesterday morning. Drain was reopened to 20 Cm H20 with improvement of symptoms. She is more lethargic today with softer blood pressures.   Objective: Vital signs in last 24 hours: Temp:  [98.4 F (36.9 C)-100.7 F (38.2 C)] 100.7 F (38.2 C) (06/08 0400) Pulse Rate:  [71-83] 80 (06/08 0731) Resp:  [10-28] 20 (06/08 0731) BP: (89-132)/(31-98) 92/62 (06/08 0731) SpO2:  [94 %-100 %] 98 % (06/08 0731) FiO2 (%):  [28 %] 28 % (06/08 0731) Weight:  [99.2 kg] 99.2 kg (06/07 1718)  Intake/Output from previous day: 06/07 0701 - 06/08 0700 In: 2310 [NG/GT:2310] Out: 2195 [Drains:195] Intake/Output this shift: No intake/output data recorded.  Physical Exam: Patient with trach on trach collar. She is no NAD. Increased lethargy. Eyes open to voice, required increased stimulation, not sustained and without eye contact. Follows simple commands on the right foot but having increased difficulty following commands in her RUE. No purposeful movement on the left. Pupils 6m NR. EVD site is intact with good pulsatile flow. EVD output over last 24 hours approximately 195.    Lab Results: Recent Labs    09/01/21 1656  WBC 8.1  HGB 9.8*  HCT 31.1*  PLT 124*   BMET Recent Labs    09/01/21 1656  NA 134*  K 3.9  CL 90*  CO2 28  GLUCOSE 167*  BUN 38*  CREATININE 5.23*  CALCIUM 9.3    Studies/Results: No results found.  Assessment/Plan: 56year old female who presented to the ED with nausea, vomiting, and left-sided hemiparesis who was found to have a large thalamic bleed with intraventricular extension. While in the emergency department, her neurological status declined and required emergent intubation for airway protection. EVD placed on 08/23/2021 due to development of hydrocephalus. Follow up CT head after EVD placement revealed stable right thalamic bleed with a slight decrease in the  lateral and third ventricles. Tracheostomy placed on 08/27/2021. Plan for PEG tube.  Failed attempt at clamping yesterday due to nausea, emesis, and headaches. Drain reopened to 20 Cm H20 with resolution of emesis and headaches. This morning she is more lethargic with decreased ability to follow commands in her RUE. Left hemiplegia persists. HD performed over last 3 days. BPs softer today. I question whether her increased lethargy is metabolic in nature in setting of HD. I do not fell that this is due to EVD clamp trial yesterday. Will plan to re clamp drain this morning. If she is able to tolerate clamp trial today, will repeat CTH in the morning with possible discontinuation of the ventriculostomy tomorrow.    -EVD clamped -CTH in the morning      LOS: 10 days     JMarvis Moeller DNP, AGNP-C Neurosurgery Nurse Practitioner  [facility] [address], [place] [id number]: [phone]   F: [phone] 09/02/2021 8:25 AM

## 2021-09-02 NOTE — Progress Notes (Signed)
NAME:  Janet Mitchell, MRN:  [id number], DOB:  1966-02-19, LOS: 10 ADMISSION DATE:  08/23/2021, CONSULTATION DATE:  08/23/2021 REFERRING MD:  Dr. Rory Percy - Neuro CHIEF COMPLAINT: Code Stroke  History of Present Illness:  56 year old woman who presented to [facility] 5/29 with reported left-sided weakness, slurred speech, nausea/vomiting. LKN 6 hours prior to arrival. PMHx significant for HTN, CHF (Echo 07/2021 EF 60-65%, severe LVH, G2DD), T2DM (c/b diabetic neuropathy with subsequent L great toe amputation 2/2 ulcer/infection), ESRD (on HD MWF via RIJ TDC).  On arrival to ED, patient is somnolent with slight disconjugate gaze with near flaccid left upper arm and left leg weakness. BP 222/107. CT Head with 5cc acute hematoma in the right thalamus with intraventricular extension. Neurology consulted and patient transferred to [facility] for further evaluation. PCCM consulted for admission.  On arrival to unit patient is intubated, pupils 33m bilaterally and non-reactive. Withdrawals in all extremities, does not follow commands, on Propofol and Cleviprex gtt.    Pertinent Medical History:  ESRD with HD MWF, CHF, HTN, DM, s/p left great toe amputation secondary to ulcer/infection   Significant Hospital Events: Including procedures, antibiotic start and stop dates in addition to other pertinent events   [facility]28 [facility] HD, more awake, following commands with RUE/RLE 6/1 No issues overnight, she remains alert and interactive on vent.  Extubated 6/2 failed extubation requiring reintubation with eventual establishment of tracheostomy by afternoon 6/4 Placed on ATC yesterday afternoon and has tolerated well since 6/5: EVD raised to 20 cm H20 6/8: Failed EVD clamping trial after developing emesis and headache,  Interim History / Subjective:   No overnight events.  Febrile overnight up to 100.7.  Increasing hyperglycemic.   This AM, patient appears  more lethargic than prior days. She does not open her eyes to voice. She is minimally interactive.   Objective:  Blood pressure 92/62, pulse 80, temperature (!) 100.7 F (38.2 C), temperature source Axillary, resp. rate 20, height '5\' 7"'$  (1.702 m), weight 99.2 kg, SpO2 98 %.    FiO2 (%):  [28 %] 28 %   Intake/Output Summary (Last 24 hours) at 09/02/2021 0737 Last data filed at 09/02/2021 0600 Gross per 24 hour  Intake 2310 ml  Output 2195 ml  Net 115 ml    Filed Weights   08/31/21 1751 08/31/21 2051 09/01/21 1718  Weight: 100.3 kg 99.4 kg 99.2 kg   Physical Examination:  General: Acutely ill appearing middle aged female now on ATC, in NAD HEENT: 6 cuffed trach midline, MM pink/moist, PERRL,  Neuro: Lethargic with difficulty waking. Moves right upper and bilateral lower extremities spontaneously. Mild tremor noted of the right hand. Pupils equal.  CV: s1s2 regular rate and rhythm, no murmur, rubs, or gallops,  PULM: Mildly coarse breathe sounds noted bilaterally. No increased work of breathing. No wheezes or rhonchi.  GI: soft, bowel sounds active in all 4 quadrants, non-tender, non-distended, tolerating TF Extremities: warm/dry, no edema  Skin: no rashes or lesions  Resolved Hospital Problem List:   Slight Hyperkalemia, K 6.0 Hypernatremia  Foot Ulcer, +Pseudomonas on recent culture HTN Emergency   Assessment & Plan:   Acute Encephalopathy - Increased lethargy today compared to prior days. Differential includes sleep deprivation given q1h neuro checks versus hypotension related hypo-perfusion state given low BP. Will also check CXR, pCO2 and electrolytes. If not improving by this afternoon, will plan to repeat CTH.  P:  CBC, BMP,  VBG, CXR pending   Right Thalamus ICH with IVH and Obstructive Hydrocephalus s/p EVD - Repeat MRI brain 5/31 No change in size of the right thalamic intraparenchymal hemorrhage is suspected, maximal dimension 3.6 cm by MRI.  - HTS stopped 6/1 -  Failed EVD clamping trial yesterday. Per Neurosurgery, will retry clamping trial today. P: Maintain neuro protective measures Nutrition and bowel regiment  Seizure precautions  Aspirations precautions  EVD per NSGY Continue with Cortrak at this time while awaiting CIR evaluation  Acute Hypoxic Respiratory Failure  Aspiration Pneumonia - Secondary to above  - Tracheostomy placed on 6/2 due to multiple failed extubations. Tolerating TC since 6/3 AM.  - S/p 5 days of IV Zosyn - Likely aspiration event on 6/7. Subsequently developed low-grade fever. Will hold off on antibiotics for now as respiratory status is unchanged.  P: Routine trach care  Head of bed elevated 30 degrees Minimize sedation  Hypertension  - Goal SBP < 160 per Stroke - Blood pressure low today. Potentially due to hypovolemia given increased dialysis sessions. Will discontinue Clonidine and reassess.  P: Continue Norvasc, Hydralazine, Losartan, and Labetalol. Hold for SBP < 90 No HD today Monitor BP closely Continuous telemetry   ESRD on HD MWF  P: Nephrology following for HD management Avoid nephrotoxins Ensure adequate renal perfusion   DM P: SSI q4h CBG goal 140-180 CBG checks q4hrs  Best Practice: (right click and "Reselect all SmartList Selections" daily)   Diet/type: NPO DVT prophylaxis: Heparin GI prophylaxis: N/A Lines: N/A Foley:  N/A Code Status:  full code Last date of multidisciplinary goals of care discussion [Per primary]   Dr. Jose Persia Internal Medicine PGY-3   09/02/2021, 7:37 AM

## 2021-09-02 NOTE — TOC CAGE-AID Note (Signed)
Transition of Care [facility]) - CAGE-AID Screening   Patient Details  Name: Janet Mitchell MRN: [id number] Date of Birth: [birth year]  Transition of Care [facility]) CM/SW Contact:    Evann Erazo C Tarpley-Carter, [place] Phone Number: 09/02/2021, 11:09 AM   Clinical Narrative: Pt is unable to participate in Cage Aid.  Kahlie Deutscher Tarpley-Carter, MSW, LCSW-A Pronouns:  She/Her/Hers [facility] HealthTransitions of Care Clinical Social Worker Direct Number:  [phone] [email or web address]    CAGE-AID Screening: Substance Abuse Screening unable to be completed due to: : Patient unable to participate

## 2021-09-02 NOTE — Progress Notes (Signed)
Nutrition Follow-up  DOCUMENTATION CODES:   Obesity unspecified  INTERVENTION:   Tube feeding via cortrak tube: Vital AF 1.2 at 70 ml/h (1680 ml per day)  Provides 2016 kcal, 126 gm protein, 1362 ml free water daily   NUTRITION DIAGNOSIS:   Inadequate oral intake related to inability to eat (pt sedated and ventilated) as evidenced by NPO status. Ongoing.   GOAL:   Provide needs based on ASPEN/SCCM guidelines Met with TF at goal.   MONITOR:   TF tolerance  REASON FOR ASSESSMENT:   Ventilator    ASSESSMENT:   56 y/o female with h/o ESRD on HD, HTN, DM, COVID 19 (03/2019) and CHF who is admitted with right thalamus ICH with IVH and obstructive hydrocephalus s/p right frontal ventriculostomy with EVD placement 5/29.  Pt discussed during ICU rounds and with RN and MD.  Remains on trach collar.  Pt with episodes of vomiting after EVD clamped both yesterday and today.  Plan for PEG. Abd xray this afternoon shows cortrak remains in distal stomach    5/29 - s/p EVD 6/2 - s/p trach and cortrak placement   Medications reviewed and include: nutrisource fiber, SSI, novolog, rena-vit  Labs reviewed: Na 134, PO 4.8  UF: 2000    Diet Order:   Diet Order             Diet NPO time specified  Diet effective now                   EDUCATION NEEDS:   No education needs have been identified at this time  Skin:  Skin Assessment:  (eschar L great toe amputation site)  Last BM:  200 ml via rectal tube  Height:   Ht Readings from Last 1 Encounters:  08/23/21 5' 7" (1.702 m)    Weight:   Wt Readings from Last 1 Encounters:  09/01/21 99.2 kg    Ideal Body Weight:  61.3 kg  BMI:  Body mass index is 34.25 kg/m.  Estimated Nutritional Needs:   Kcal:  1900-2200kcal/day  Protein:  >125g/day  Fluid:  1.9-2.2L/day  Lockie Pares., RD, LDN, CNSC See AMiON for contact information

## 2021-09-02 NOTE — Progress Notes (Addendum)
[place] Progress Note Patient Name: Janet Mitchell DOB: 06-02-1965 MRN: [id number]   Date of Service  09/02/2021  HPI/Events of Note  Tube feeding off until tomorrow. Blood glucose = 72. Patient is on a Very sensitive Q 4 hour Novolog SSI + Q 4 hour Novolog tube feeding coverage which is written to be held if tube feeding not at goal or off.  eICU Interventions  Plan: D10W IV infusion at 40 mL/hour.      Intervention Category Major Interventions: Other:  Lysle Dingwall 09/02/2021, 10:10 PM

## 2021-09-02 NOTE — Progress Notes (Signed)
Patient ID: Janet Mitchell, female   DOB: [birth year], 56 y.o.   MRN: [id number] [facility] Progress Note   Assessment/ Plan:   1.  Right thalamic intracranial hemorrhage: With intraventricular hemorrhage and hydrocephalus and now status post ventriculostomy drain placement by neurosurgery with ongoing active monitoring by neurology.  2. ESRD: She is usually on a Monday/Wednesday/Friday dialysis schedule and earlier this week underwent frequent hemodialysis/ultrafiltration to help improve volume status/respiratory status.  Will schedule for HD tomorrow. . 3. Anemia: Hemoglobin and hematocrit currently acceptable, continue to follow trend to decide on need to restart ESA. 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 5. Nutrition: On tube feeds at this time and unclear if will get PEG tube or will continue with tube feeds via Cortrak 6. Hypertension: Blood pressure marginally low, continue to monitor on current antihypertensive therapy and hemodialysis.  Subjective:   Underwent hemodialysis without problems yesterday.    Objective:   BP 92/62   Pulse 80   Temp (!) 100.7 F (38.2 C) (Axillary)   Resp 20   Ht '5\' 7"'$  (1.702 m)   Wt 99.2 kg   SpO2 98%   BMI 34.25 kg/m   Physical Exam: Gen: Sleeping soundly and difficult to arouse, ventriculostomy drain in situ with serosanguineous collection CVS: Pulse regular rhythm, normal rate, S1 and S2 normal Resp: On trach collar, anteriorly clear to auscultation, no rales/rhonchi Abd: Soft, obese, nontender, bowel sounds normal Ext: Trace lower extremity edema with protective heel dressings, left upper arm AV fistula with intact dressings and pulsatile  Labs: BMET Recent Labs  Lab 08/27/21 0359 08/27/21 0840 08/27/21 1123 08/28/21 0435 08/29/21 0514 08/30/21 0441 09/01/21 1656  NA 151* 151* 150* 140 137 136 134*  K 3.8 4.0 3.7 3.1* 3.7 4.0 3.9  CL  --  114*  --  102 99  98 90*  CO2  --  26  --  '27 28 25 28  '$ GLUCOSE  --  89  --  111* 151* 148* 167*  BUN  --  32*  --  19 18 33* 38*  CREATININE  --  6.41*  --  4.05* 3.65* 5.30* 5.23*  CALCIUM  --  7.9*  --  8.2* 8.3* 8.4* 9.3  PHOS  --   --   --  4.0 3.5 4.8*  --    CBC Recent Labs  Lab 08/27/21 0359 08/27/21 1123 08/28/21 0435 09/01/21 1656  WBC  --   --  6.3 8.1  HGB 11.2* 10.9* 10.2* 9.8*  HCT 33.0* 32.0* 33.3* 31.1*  MCV  --   --  79.7* 78.3*  PLT  --   --  89* 124*    Medications:     amantadine  200 mg Per Tube Weekly   amLODipine  10 mg Per Tube Daily   calcitRIOL  1 mcg Per Tube Q M,W,F   chlorhexidine  15 mL Mouth Rinse BID   Chlorhexidine Gluconate Cloth  6 each Topical Q0600   fentaNYL (SUBLIMAZE) injection  200 mcg Intravenous Once   fiber  1 packet Per Tube BID   heparin injection (subcutaneous)  5,000 Units Subcutaneous Q8H   hydrALAZINE  100 mg Per Tube Q8H   insulin aspart  0-6 Units Subcutaneous Q4H   insulin aspart  5 Units Subcutaneous Q4H   labetalol  200 mg Per Tube Q8H   losartan  100 mg Per Tube Daily   mouth rinse  15 mL  Mouth Rinse q12n4p   multivitamin  1 tablet Per Tube QHS   Elmarie Shiley, MD 09/02/2021, 9:24 AM

## 2021-09-02 NOTE — Progress Notes (Addendum)
STROKE TEAM PROGRESS NOTE   INTERVAL HISTORY Patient is seen in her room with no family at the bedside.  Hypotensive. Patient vomited/aspirated yesterday after swallow eval and EVD clamping. Unclamped overnight. She is reporting some either chest or abdominal pain, but less alert today. She is not following commands as frequently and more difficult to arouse. EKG, ABG, and CXR ordered with CCM.  EVD draining at 20 cm H2O.  NSGY plans to clamp drain today, get Ct head again in  the morning and then possibly remove the ventric tomorrow.  Neuro exam she appears sleepier today but she has also been quite hypotensive and some of her blood pressure medications have been held.  Vitals:   09/02/21 0600 09/02/21 0700 09/02/21 0731 09/02/21 0800  BP: (!) '90/58 94/63 92/62 '$   Pulse: 79 78 80   Resp: (!) '21 18 20   '$ Temp:    99.9 F (37.7 C)  TempSrc:    Axillary  SpO2: 98% 98% 98%   Weight:      Height:       CBC:  Recent Labs  Lab 08/28/21 0435 09/01/21 1656  WBC 6.3 8.1  HGB 10.2* 9.8*  HCT 33.3* 31.1*  MCV 79.7* 78.3*  PLT 89* 124*    Basic Metabolic Panel:  Recent Labs  Lab 08/29/21 0514 08/30/21 0441 09/01/21 1656  NA 137 136 134*  K 3.7 4.0 3.9  CL 99 98 90*  CO2 '28 25 28  '$ GLUCOSE 151* 148* 167*  BUN 18 33* 38*  CREATININE 3.65* 5.30* 5.23*  CALCIUM 8.3* 8.4* 9.3  MG 1.7 1.8  --   PHOS 3.5 4.8*  --     Lipid Panel:  Recent Labs  Lab 08/28/21 0435  TRIG 174*    HgbA1c:  No results for input(s): "HGBA1C" in the last 168 hours.  Urine Drug Screen: No results for input(s): "LABOPIA", "COCAINSCRNUR", "LABBENZ", "AMPHETMU", "THCU", "LABBARB" in the last 168 hours.  Alcohol Level  No results for input(s): "ETH" in the last 168 hours.   IMAGING past 24 hours DG Chest 1 View  Result Date: 09/02/2021 CLINICAL DATA:  Respiratory abnormality. EXAM: CHEST  1 VIEW COMPARISON:  AP chest 08/27/2021 FINDINGS: Tracheostomy tube overlies the midline trachea. Mildly decreased  lung volumes. Cardiac silhouette and mediastinal contours are unchanged. Enteric tube again descends below the diaphragm with the tip excluded by collimation. Right lower lung horizontal linear likely platelike atelectasis is new from prior. Bibasilar heterogeneous airspace opacities are similar to prior. No pneumothorax is seen. Cholecystectomy clips. IMPRESSION: 1. Tracheostomy tube in appropriate position. 2. There are again moderately decreased lung volumes. Heterogeneous bilateral basilar airspace opacities may represent atelectasis pulmonary edema or pneumonia. No significant interval change. Electronically Signed   By: Yvonne Kendall M.D.   On: 09/02/2021 10:53    PHYSICAL EXAM  Physical Exam  Constitutional: Appears well-developed and well-nourished middle-age African-American lady.  Cardiovascular: Normal rate and regular rhythm.  Respiratory: Effort normal, non-labored breathing, on trach collar  Neuro -s/p tracheostomy without sedation, she is awake with stimulation with eyes open with mild left ptosis, able to follow simple commands.  Left gaze preference but able to overcome. PERRL. Gag and cough present.  .  Face appears symmetrical.  Tongue protrusion midline.   Right side upper and lower extremity spontaneous movement, left upper and lower extremity hemiplegia with mild withdraw on the LLE with pain.  Sensation, coordination not cooperative and gait not tested.   ASSESSMENT/PLAN [name]  is a 56 y.o. female with history of ESRD on HD MWF, CHF, HTN, DM presenting with nausea, vomiting, left side weakness present upon waking. Intubated emergently at [facility] and transferred to [facility] on 5/59.  NSGY consulted, EVD placement 5/29. MRI shows stable size in right thalamic hemorrhage with edema extending to the pons and midbrain. EVD still in place. Extubated 08/26/2021. Reintubated 08/27/2021 and then tracheostomy placed. Consult for PEG tube sent to trauma team.   ICH:  Right  thalamic ICH extending to right midbrain with IVH and mild hydrocephalus s/p EVD, likely secondary to uncontrolled hypertension Code Stroke CT head Right thalamic IVH previously 2.9 x 1.7 x 3.1 cm 5/29 Head CT 1000- Interval significant increase in size of a an acute hematoma in the right thalamus, now measuring 3.6 x 2.5 x 3.8 cm. Small volume intraventricular extension of hemorrhage. Mild hydrocephalus. 5/29 Head CT 2200- decrease in size of lateral and third ventricles s/p EVD placement  CTA head & neck No arterial lesion underlying the right thalamic hematoma. Equivocal for spot sign which would be an adverse finding suggesting continued growth MRI  no change in size of the right thalamic intraparenchymal hemorrhage extending to midbrain, maximal dimension 3.6 cm by MRI. Surrounding edema, likely slightly more prominent. 2D Echo EF 60-65%, consistent with grade II diastolic dysfunction EEG - mild diffuse encephalopathy LDL 38 HgbA1c 6.2 VTE prophylaxis - SCDs aspirin 81 mg daily prior to admission, now on No antithrombotic.  Therapy recommendations:  CIR Disposition:  Pending  Obstructive Hydrocephalus NSGY on board 5/29- EVD placement  Follow up CT shows slightly decrease in size of lateral and third ventricle  Plan for increase level and attempt clamping soon.   Cerebral edema On 3% saline @ 75- d/c'd Given ESRD, try to avoid fluid overload, will d/c 3% saline Na goal 150-155 Na 142->146->141->147 -> 150-> 136  Acute Hypoxic Respiratory failure Intubated 5/29 CCM managing vent Extubated 6/1 Reintubated and s/p trach 6/2  Hypertensive emergency Home meds:  labetolol, hydralazine, losartan Stable BP less than 160 On losartan 100, labetalol 200 3 times daily, hydralazine 50 3 times daily Add amlodipine 10 Long-term BP goal normotensive Still on cleviprex, taper off as able  End Stage Renal Failure on hemodialysis MWF Nephrology consulted Dialysis done 5/30 Monitor  BMP/electrolytes  Diabetes Mellitus A1c 6.2, controlled CBGs SSI Close PCP follow-up  Other Stroke Risk Factors Obesity, Body mass index is 34.25 kg/m., BMI >/= 30 associated with increased stroke risk, recommend weight loss, diet and exercise as appropriate  Congestive heart failure  Other Active Problems Hyperkalemia  K 6.1->4.2-> 3.5, s/p HD and Lokelma Dysphagia - cortrak placed on [facility] day # 10  Patient seen and examined by NP/APP with MD. MD to update note as needed.   Janine Ores, DNP, FNP-BC [facility] Pager: [phone] I have personally obtained history,examined this patient, reviewed notes, independently viewed imaging studies, participated in medical decision making and plan of care.ROS completed by me personally and pertinent positives fully documented  I have made any additions or clarifications directly to the above note. Agree with note above.  Patient appears sleepier today but can be aroused with some difficulty.  This not following commands as briskly today.  She has been quite hypotensive.  Ventriculostomy has been clamped this morning but will discuss with neurosurgery about unclamping the ventriculostomy and check brain imaging is look for any interval change in ventricular size.  Discussed with critical care team.  No family  available at the bedside today.This patient is critically ill and at significant risk of neurological worsening, death and care requires constant monitoring of vital signs, hemodynamics,respiratory and cardiac monitoring, extensive review of multiple databases, frequent neurological assessment, discussion with family, other specialists and medical decision making of high complexity.I have made any additions or clarifications directly to the above note.This critical care time does not reflect procedure time, or teaching time or supervisory time of PA/NP/Med Resident etc but could involve care discussion time.  I spent 30  minutes of neurocritical care time  in the care of  this patient.      Antony Contras, MD Medical Director [place] Pager: [phone] 09/02/2021 12:44 PM   To contact Stroke Continuity provider, please refer to [email or web address] After hours, contact General Neurology

## 2021-09-02 NOTE — Progress Notes (Signed)
RT x2 attempted to obtain ABG, was unsuccessful. MD was notified and order will be changed verbally to VBG.

## 2021-09-02 NOTE — Progress Notes (Signed)
PT Cancellation Note  Patient Details Name: Janet Mitchell MRN: [id number] DOB: March 10, 1966   Cancelled Treatment:    Reason Eval/Treat Not Completed: Medical issues which prohibited therapy. RN reports pt with more vomiting this afternoon, requesting PT hold at this time. PT will attempt to follow up as time allows.   Zenaida Niece 09/02/2021, 1:24 PM

## 2021-09-02 NOTE — Progress Notes (Signed)
Physical Therapy Treatment Patient Details Name: Janet Mitchell MRN: [id number] DOB: [birth year] Today's Date: 09/02/2021   History of Present Illness 56 y.o. female presents to [facility] hospital as a transfer from [place] on 08/23/2021 with complaints of nausea, vomiting, L weakness. Pt required emergent intubation during transfer from [facility]. CTH reveals R thalamic hemorrhage with IVH. Pt underwent R frontal ventriculostomy on 5/29. Extubated 6/1, reintubated 6/2, tracheostomy 6/2. PMH includes HTN, CHF, MDII, ESRD.    PT Comments    Pt progresses to sitting edge of bed today, remaining totalA for functional mobility due to L side remaining flaccid other than digit flexion in hand, as well as generalized R weakness. Pt often with posterior losses of balance, attempting to right unsuccessfully. Pt will benefit from continued attempts at mobilization in an effort to reduce caregiver burden and falls risk. PT continues to recommend AIR admission however if progress remains slow then SNF may become more appropriate.    Recommendations for follow up therapy are one component of a multi-disciplinary discharge planning process, led by the attending physician.  Recommendations may be updated based on patient status, additional functional criteria and insurance authorization.  Follow Up Recommendations  Acute inpatient rehab (3hours/day)     Assistance Recommended at Discharge Frequent or constant Supervision/Assistance  Patient can return home with the following Two people to help with walking and/or transfers;Two people to help with bathing/dressing/bathroom;Assistance with cooking/housework;Assistance with feeding;Direct supervision/assist for medications management;Direct supervision/assist for financial management;Help with stairs or ramp for entrance;Assist for transportation   Equipment Recommendations  Hospital bed;Other (comment);Wheelchair (measurements PT);Wheelchair cushion (measurements  PT)    Recommendations for Other Services       Precautions / Restrictions Precautions Precautions: Fall;Other (comment) Precaution Comments: trach, EVD, cortrack, flexiseal Restrictions Weight Bearing Restrictions: No     Mobility  Bed Mobility Overal bed mobility: Needs Assistance Bed Mobility: Supine to Sit, Sit to Supine     Supine to sit: Total assist, HOB elevated Sit to supine: Total assist, HOB elevated   General bed mobility comments: pt initiates movement of RLE, little pull noted through RUE. PT provides support to elevate trunk and pivot hips    Transfers Overall transfer level: Needs assistance Equipment used: 1 person hand held assist Transfers: Sit to/from Stand Sit to Stand:  (attempted sit to stand with L knee block but unable to clear buttocks from bed)                Ambulation/Gait                   Stairs             Wheelchair Mobility    Modified Rankin (Stroke Patients Only)       Balance Overall balance assessment: Needs assistance Sitting-balance support: Single extremity supported, No upper extremity supported, Feet supported Sitting balance-Leahy Scale: Zero Sitting balance - Comments: max-totalA, pt with many posterior losses of balance, attempts to correct but is unable without physical assistance Postural control: Posterior lean                                  Cognition Arousal/Alertness: Awake/alert Behavior During Therapy: Flat affect Overall Cognitive Status: Difficult to assess                                 General  Comments: pt is alert, intermittently closes eyes but responds when stimulated. Pt follows commands with increased time        Exercises General Exercises - Upper Extremity Shoulder Flexion: PROM, Left, 10 reps Elbow Flexion: PROM, Left, 10 reps Elbow Extension: PROM, Left, 10 reps Wrist Flexion: PROM, Left, 5 reps Digit Composite Flexion: AROM, Left, 5  reps General Exercises - Lower Extremity Ankle Circles/Pumps: AROM, Right, 5 reps Heel Slides: PROM, Left, 10 reps Hip ABduction/ADduction: PROM, Left, 10 reps Hip Flexion/Marching: PROM, Left, 5 reps    General Comments General comments (skin integrity, edema, etc.): VSS on 5L 28% FiO2, BP fluctuating during session, 131/62 supine, 122/67 sitting, then PT notes automatic BP taken 3 minutes later of 99/67. BP in supine at end of session into 408X systolic      Pertinent Vitals/Pain Pain Assessment Pain Assessment: Faces Faces Pain Scale: No hurt    Home Living                          Prior Function            PT Goals (current goals can now be found in the care plan section) Acute Rehab PT Goals Patient Stated Goal: not stated Progress towards PT goals: Progressing toward goals (slowly)    Frequency    Min 3X/week      PT Plan Current plan remains appropriate    Co-evaluation              AM-PAC PT "6 Clicks" Mobility   Outcome Measure  Help needed turning from your back to your side while in a flat bed without using bedrails?: Total Help needed moving from lying on your back to sitting on the side of a flat bed without using bedrails?: Total Help needed moving to and from a bed to a chair (including a wheelchair)?: Total Help needed standing up from a chair using your arms (e.g., wheelchair or bedside chair)?: Total Help needed to walk in hospital room?: Total Help needed climbing 3-5 steps with a railing? : Total 6 Click Score: 6    End of Session Equipment Utilized During Treatment: Oxygen Activity Tolerance: Patient tolerated treatment well Patient left: in bed;with call bell/phone within reach;with bed alarm set Nurse Communication: Mobility status;Need for lift equipment PT Visit Diagnosis: Other abnormalities of gait and mobility (R26.89);Other symptoms and signs involving the nervous system (R29.898);Muscle weakness (generalized)  (M62.81);Hemiplegia and hemiparesis Hemiplegia - Right/Left: Left Hemiplegia - caused by: Nontraumatic intracerebral hemorrhage     Time: 4481-8563 PT Time Calculation (min) (ACUTE ONLY): 26 min  Charges:  $Therapeutic Exercise: 8-22 mins $Therapeutic Activity: 8-22 mins                     Zenaida Niece, PT, DPT Acute Rehabilitation Office [phone]    Zenaida Niece 09/02/2021, 5:31 PM

## 2021-09-03 ENCOUNTER — Inpatient Hospital Stay (HOSPITAL_COMMUNITY): Payer: 59

## 2021-09-03 DIAGNOSIS — I629 Nontraumatic intracranial hemorrhage, unspecified: Secondary | ICD-10-CM | POA: Diagnosis not present

## 2021-09-03 DIAGNOSIS — J9601 Acute respiratory failure with hypoxia: Secondary | ICD-10-CM | POA: Diagnosis not present

## 2021-09-03 LAB — GLUCOSE, CAPILLARY
Glucose-Capillary: 155 mg/dL — ABNORMAL HIGH (ref 70–99)
Glucose-Capillary: 163 mg/dL — ABNORMAL HIGH (ref 70–99)
Glucose-Capillary: 136 mg/dL — ABNORMAL HIGH (ref 70–99)
Glucose-Capillary: 184 mg/dL — ABNORMAL HIGH (ref 70–99)
Glucose-Capillary: 143 mg/dL — ABNORMAL HIGH (ref 70–99)
Glucose-Capillary: 167 mg/dL — ABNORMAL HIGH (ref 70–99)

## 2021-09-03 IMAGING — DX DG CHEST 1V PORT
1 series · 1 of 1 positions shown · non-contrast
Comparison: Chest x-ray from yesterday

CLINICAL DATA: [JZ] [PHONE_NUMBER] hypoxemia, aspiration into airway

EXAM:
PORTABLE CHEST 1 VIEW

[chest ap]
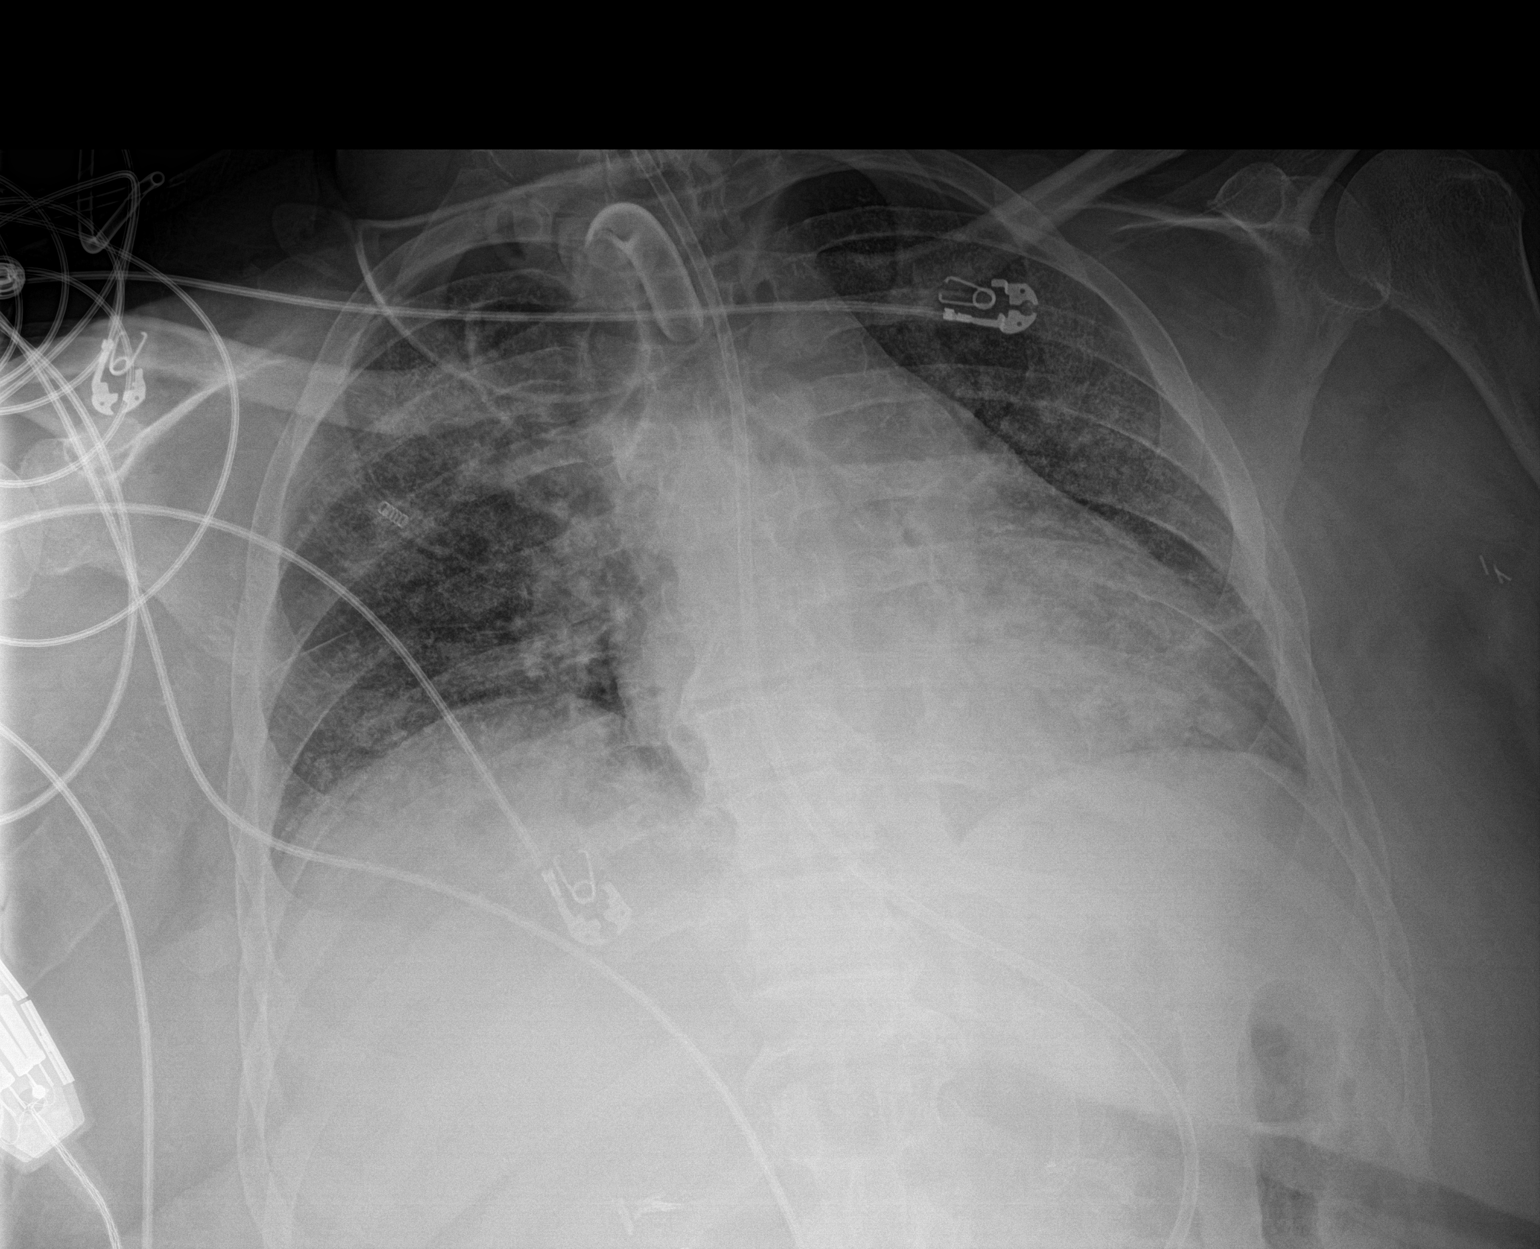

[1 of 1 positions shown; findings below may reference images not displayed]

FINDINGS: Again seen are the tracheostomy tube and feeding tube. Cardiomegaly.
There has been improvement of the right basal atelectasis. Left
basilar atelectasis/infiltration persists. There are some
heterogeneous opacities seen in the lungs likely pulmonary edema
without significant interval change.
IMPRESSION: Cardiomegaly. Partial resolution of the right basilar atelectasis.
Minor atelectasis/infiltration at the left lung base, stable.
Heterogeneous opacities in the lungs without significant interval
change likely on the basis of pulmonary edema.

## 2021-09-03 IMAGING — CT CT HEAD W/O CM
4 series · 16 of 37 positions shown, 18 images · non-contrast
Comparison: CT head [DATE].

CLINICAL DATA: Hydrocephalus



[Series 3: head bone · axial · 0.47mm/px · z∈[-274,-226]mm · 3 of 90 slices shown]
[im 6/90  bone]
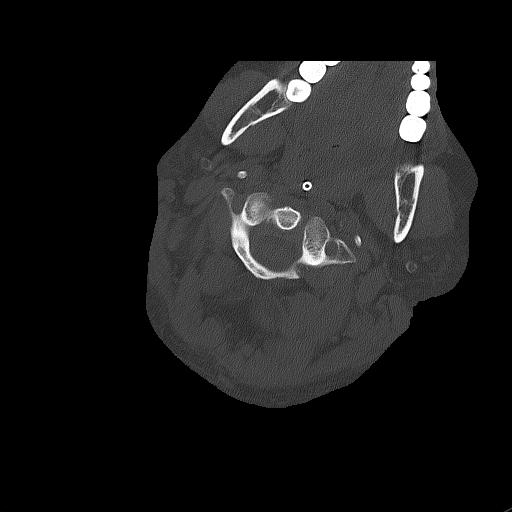
[im 18/90  bone]
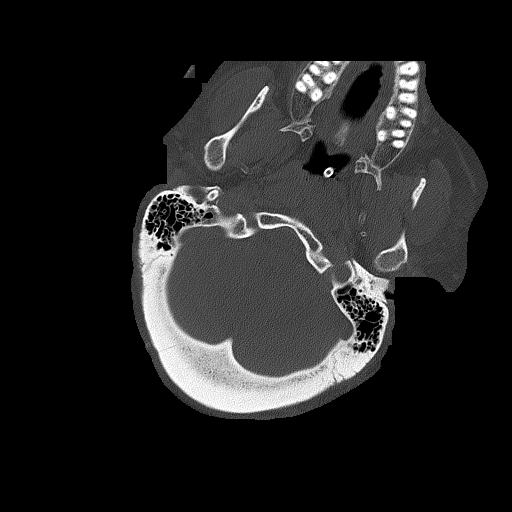
[im 30/90  bone]
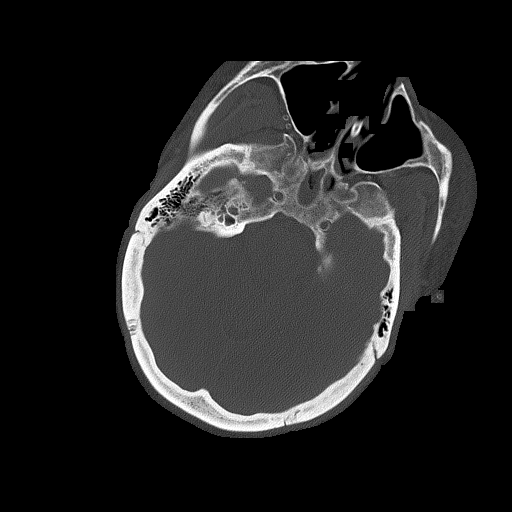

[Series 4: head without (person_name) · axial · non-contrast · 0.47mm/px · z∈[-259,-139]mm · 5 of 36 slices shown, 7 images]
[im 6/36  brain]
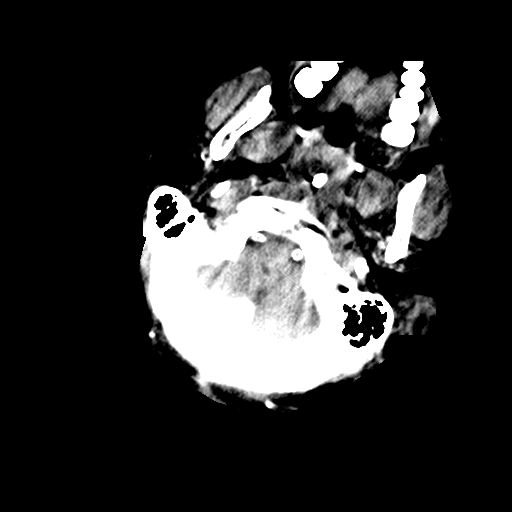
[im 6/36  bone]
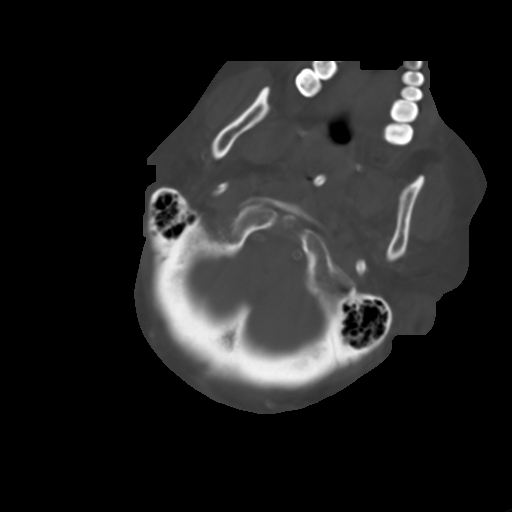
[im 12/36  brain]
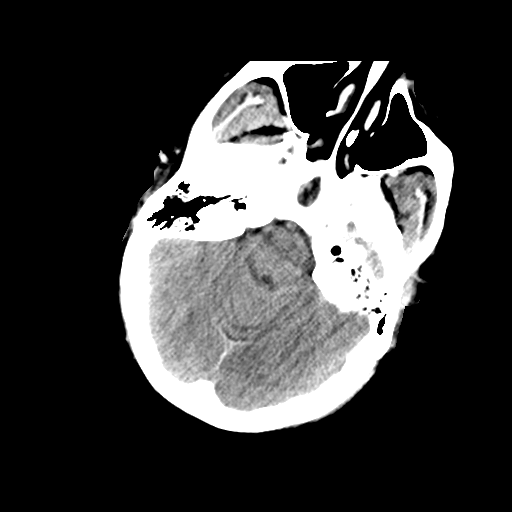
[im 18/36  brain]
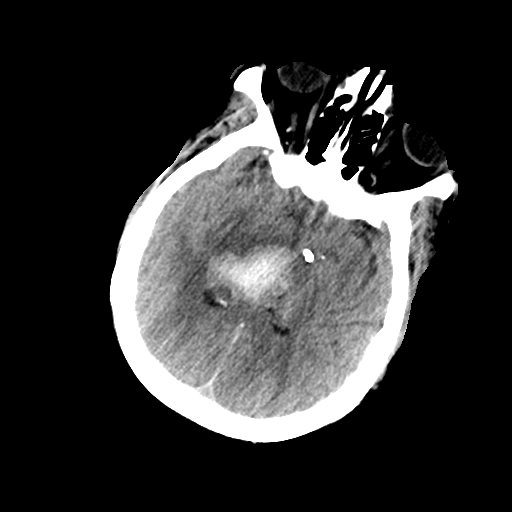
[im 24/36  brain]
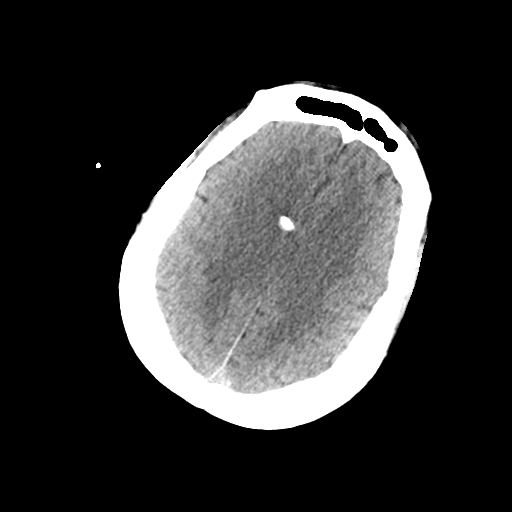
[im 30/36  brain]
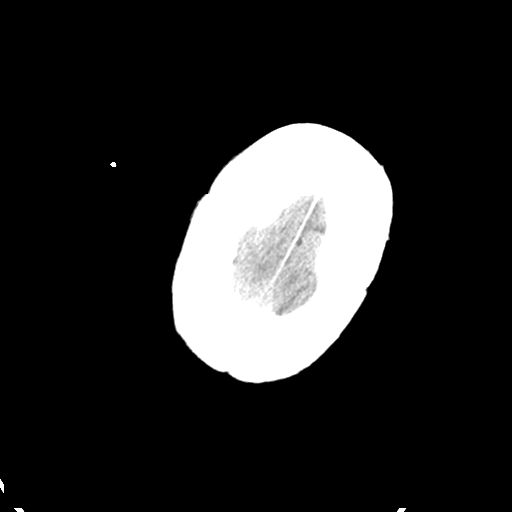
[im 30/36  bone]
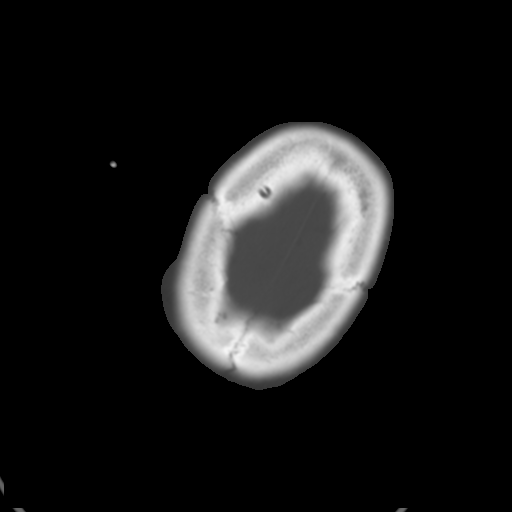

[Series 5: head without ax · axial · non-contrast · 0.37mm/px · z∈[-212,-105]mm · 5 of 35 slices shown]
[im 6/35  brain]
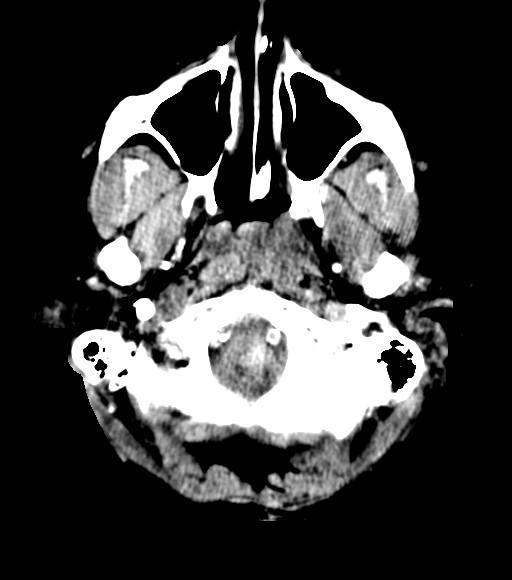
[im 12/35  brain]
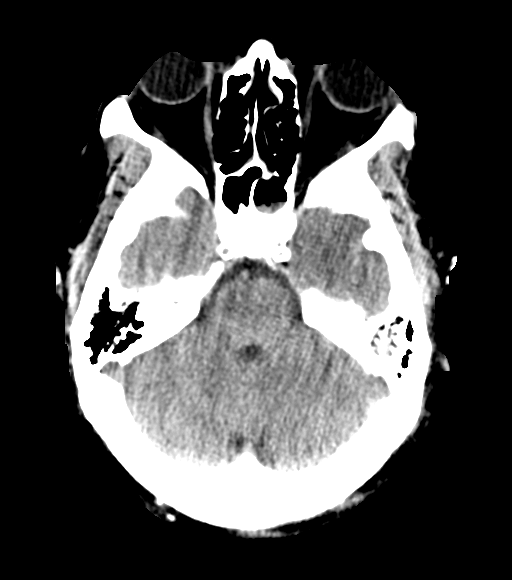
[im 18/35  brain]
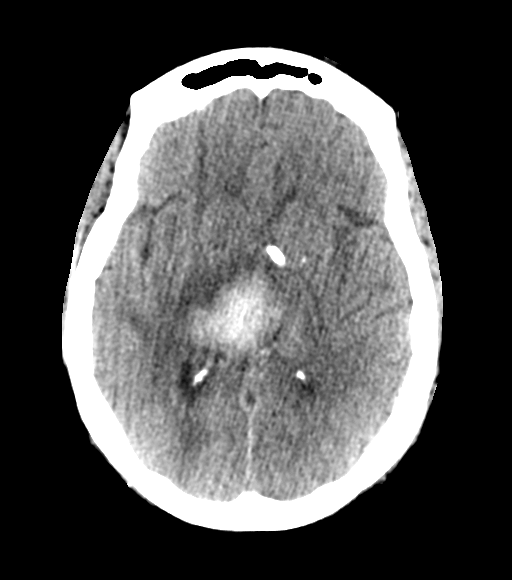
[im 23/35  brain]
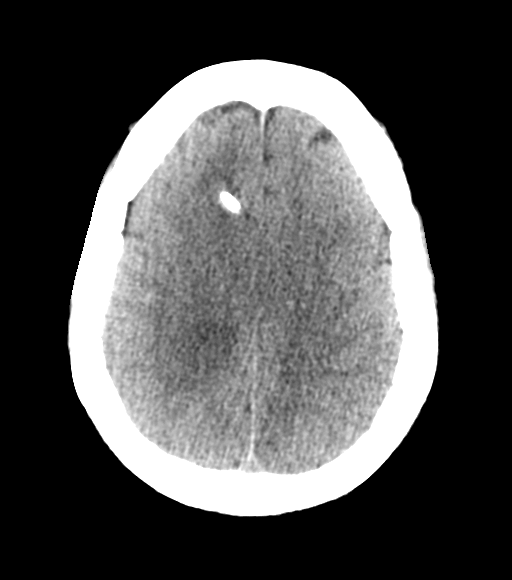
[im 29/35  brain]
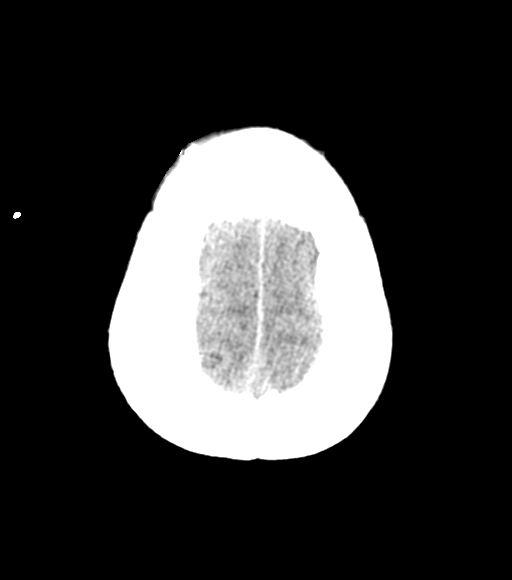

[Series 7: head without sag · sagittal · non-contrast · 0.35mm/px · 3 of 60 slices shown]
[im 20/60  brain]
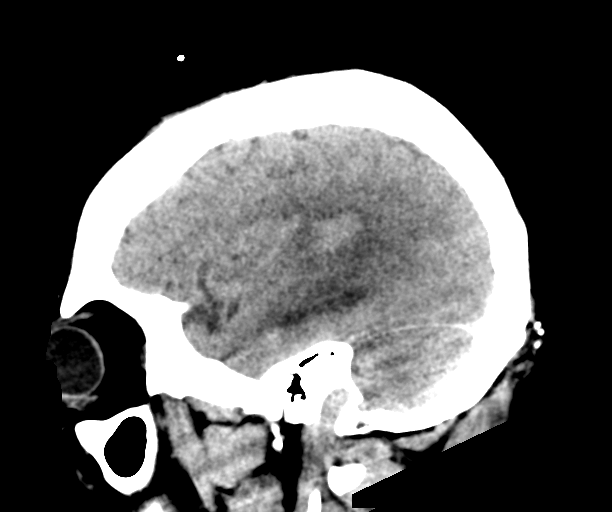
[im 30/60  brain]
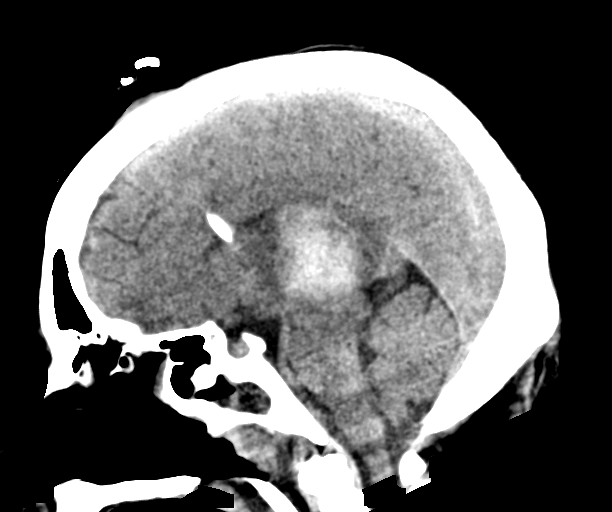
[im 40/60  brain]
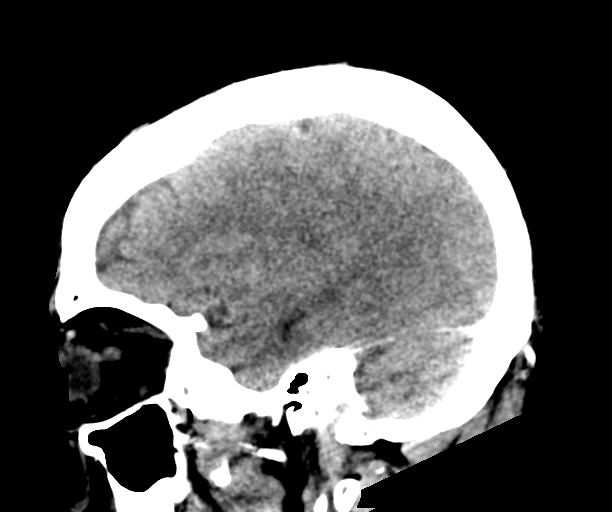

[16 of 37 positions shown; findings below may reference images not displayed]

FINDINGS: Brain: No substantial change in the right thalamic hemorrhage.
Similar stern edema and mass effect similar position of a right
frontal approach ventriculostomy catheter with decompressed
ventricular system. No evidence of acute large vascular territory
infarct.

Vascular: No hyperdense vessel identified.

Skull: No acute fracture.

Sinuses/Orbits: Air-fluid levels and frothy secretions in bilateral
sphenoid sinuses. No acute orbital findings

Other: No mastoid effusions.
IMPRESSION: 1. No substantial change in right thalamic hemorrhage. Similar
surrounding edema and mass effect.
2. Decompressed ventricles with shunt in place.

## 2021-09-03 MED ORDER — LOSARTAN POTASSIUM 50 MG PO TABS
25.0000 mg | ORAL_TABLET | Freq: Every day | ORAL | Status: DC
  Administered 2021-09-05: 25 mg
  Filled 2021-09-03: qty 1

## 2021-09-03 NOTE — Progress Notes (Addendum)
Subjective: Patient is more alert this morning and consistently following commands on the right. Return of N/V and headaches with EVD clamp trial yesterday.   Objective: Vital signs in last 24 hours: Temp:  [98.5 F (36.9 C)-100.5 F (38.1 C)] 99.2 F (37.3 C) (06/09 0000) Pulse Rate:  [73-84] 77 (06/09 0500) Resp:  [12-30] 18 (06/09 0500) BP: (79-158)/(43-140) 122/63 (06/09 0500) SpO2:  [87 %-100 %] 94 % (06/09 0500) FiO2 (%):  [28 %-35 %] 35 % (06/09 0316) Weight:  [97.8 kg] 97.8 kg (06/09 0500)  Intake/Output from previous day: 06/08 0701 - 06/09 0700 In: 509.7 [I.V.:159.7; NG/GT:350] Out: 439 [Drains:139; Stool:300] Intake/Output this shift: Total I/O In: 159.7 [I.V.:159.7] Out: 69 [Drains:69]  Physical Exam: Patient with trach on trach collar. She is resting comfortably in NAD. Eyes open spontaneously. Follows simple commands on the right. No purposeful movement on the left. Pupils 26m NR. EVD site is intact with good pulsatile flow. EVD output over last 24 hours approximately 146.    Lab Results: Recent Labs    09/01/21 1656 09/02/21 1119  WBC 8.1 10.6*  HGB 9.8* 10.4*  HCT 31.1* 32.8*  PLT 124* 128*   BMET Recent Labs    09/01/21 1656 09/02/21 1119  NA 134* 134*  K 3.9 4.1  CL 90* 91*  CO2 28 26  GLUCOSE 167* 221*  BUN 38* 36*  CREATININE 5.23* 4.62*  CALCIUM 9.3 9.3    Studies/Results: DG Abd 1 View  Result Date: 09/02/2021 CLINICAL DATA:  Feeding tube placement. EXAM: ABDOMEN - 1 VIEW COMPARISON:  08/27/2021 FINDINGS: 1504 hours. Feeding tube tip is in the distal stomach at the pylorus and directed distally towards the duodenum. Nonspecific bowel gas pattern noted in the visualized upper abdomen. IMPRESSION: Feeding tube tip is in the distal stomach at the pylorus and directed towards the duodenum. Electronically Signed   By: EMisty StanleyM.D.   On: 09/02/2021 15:17   CT HEAD WO CONTRAST (5MM)  Result Date: 09/02/2021 CLINICAL DATA:  Stroke  suspected EXAM: CT HEAD WITHOUT CONTRAST TECHNIQUE: Contiguous axial images were obtained from the base of the skull through the vertex without intravenous contrast. RADIATION DOSE REDUCTION: This exam was performed according to the departmental dose-optimization program which includes automated exposure control, adjustment of the mA and/or kV according to patient size and/or use of iterative reconstruction technique. COMPARISON:  08/23/2021 CT, correlation is also made with 08/25/2021 MRI head FINDINGS: Brain: Redemonstrated hematoma centered in the right thalamus, which measures 2.4 x 3.7 x 3.8 cm (AP x TR x CC) (series 7, image 28 and series 5, image 50), previously 2.3 x 3.7 x 3.8 cm when remeasured similarly. Increased hypodensity around the hemorrhage, likely increased edema compared to 08/23/2021 but overall similar to the 08/25/2021 MRI. No evidence of acute large vascular territory infarct. Previously noted intraventricular hemorrhage is not definitively seen on the current exam. Unchanged mass effect on the right lateral ventricle and third ventricle, with right frontal approach ventriculostomy catheter in unchanged position, terminating in the left basal ganglia. Overall unchanged size of the ventricles. Vascular: No hyperdense vessel. Atherosclerotic calcifications in the intracranial carotid and vertebral arteries. Skull: Right frontal burr hole.  No acute osseous abnormality. Sinuses/Orbits: Mucosal thickening and bubbly fluid in the sphenoid sinuses. Otherwise clear. Other: The mastoids are well aerated. IMPRESSION: 1. Overall unchanged size of the hematoma centered in the right thalamus. Surrounding edema is increased compared to the prior CT but appears similar to the 08/25/2021 MRI when  accounting for differences in technique. 2. Overall unchanged size and configuration of the shunted ventricular system. Electronically Signed   By: Merilyn Baba M.D.   On: 09/02/2021 12:42   DG Chest 1  View  Result Date: 09/02/2021 CLINICAL DATA:  Respiratory abnormality. EXAM: CHEST  1 VIEW COMPARISON:  AP chest 08/27/2021 FINDINGS: Tracheostomy tube overlies the midline trachea. Mildly decreased lung volumes. Cardiac silhouette and mediastinal contours are unchanged. Enteric tube again descends below the diaphragm with the tip excluded by collimation. Right lower lung horizontal linear likely platelike atelectasis is new from prior. Bibasilar heterogeneous airspace opacities are similar to prior. No pneumothorax is seen. Cholecystectomy clips. IMPRESSION: 1. Tracheostomy tube in appropriate position. 2. There are again moderately decreased lung volumes. Heterogeneous bilateral basilar airspace opacities may represent atelectasis pulmonary edema or pneumonia. No significant interval change. Electronically Signed   By: Yvonne Kendall M.D.   On: 09/02/2021 10:53    Assessment/Plan: 56 year old female who presented to the ED with nausea, vomiting, and left-sided hemiparesis who was found to have a large thalamic bleed with intraventricular extension. While in the emergency department, her neurological status declined and required emergent intubation for airway protection. EVD placed on 08/23/2021 due to development of hydrocephalus. Follow up CT head after EVD placement revealed stable right thalamic bleed with a slight decrease in the lateral and third ventricles. Tracheostomy placed on 08/27/2021. Plan for PEG tube.   Second failed attempt at clamping yesterday due to nausea, emesis, and headaches. Drain reopened to 20 Cm H20 with resolution of emesis and headaches. She was sent for a repeat CT head yesterday and this morning. CTs reveal stable appearance of right thalamus hemorrhage without increase in hydrocephalus. Neuro exam improved from yesterday. She is more alert and consistently following commands. Will plan to raise drain to 25 Cm H20 this morning and maintain level over the weekend. Will plan for  repeat CT head Monday morning.    -EVD raised to 25 Cm H20 -CT head Monday morning       LOS: 11 days     Marvis Moeller, DNP, AGNP-C Neurosurgery Nurse Practitioner  [facility] [address], [place], [place] [address] P: [phone]    F: [phone]  09/03/2021 5:57 AM

## 2021-09-03 NOTE — Progress Notes (Addendum)
STROKE TEAM PROGRESS NOTE   INTERVAL HISTORY Patient is seen in her room with no family at the bedside.  Her vital signs remain stable and her neurological exam is unchanged.  EVD is at 25 cm H2O, and repeat head CT will be performed on Monday.  She continues to do well on trach collar.  Her blood pressure is doing better today and she is more alert and interactive  Vitals:   09/03/21 0839 09/03/21 0900 09/03/21 1000 09/03/21 1113  BP:  (!) 124/59 (!) 103/56   Pulse:  77 74 76  Resp:  '16 19 20  '$ Temp:      TempSrc:      SpO2: 95% 94% 93% 95%  Weight:      Height:       CBC:  Recent Labs  Lab 09/01/21 1656 09/02/21 1119  WBC 8.1 10.6*  NEUTROABS  --  7.6  HGB 9.8* 10.4*  HCT 31.1* 32.8*  MCV 78.3* 77.9*  PLT 124* 128*    Basic Metabolic Panel:  Recent Labs  Lab 08/29/21 0514 08/30/21 0441 09/01/21 1656 09/02/21 1119  NA 137 136 134* 134*  K 3.7 4.0 3.9 4.1  CL 99 98 90* 91*  CO2 '28 25 28 26  '$ GLUCOSE 151* 148* 167* 221*  BUN 18 33* 38* 36*  CREATININE 3.65* 5.30* 5.23* 4.62*  CALCIUM 8.3* 8.4* 9.3 9.3  MG 1.7 1.8  --   --   PHOS 3.5 4.8*  --   --     Lipid Panel:  Recent Labs  Lab 08/28/21 0435  TRIG 174*    HgbA1c:  No results for input(s): "HGBA1C" in the last 168 hours.  Urine Drug Screen: No results for input(s): "LABOPIA", "COCAINSCRNUR", "LABBENZ", "AMPHETMU", "THCU", "LABBARB" in the last 168 hours.  Alcohol Level  No results for input(s): "ETH" in the last 168 hours.   IMAGING past 24 hours DG CHEST PORT 1 VIEW  Result Date: 09/03/2021 CLINICAL DATA:  [id number] hypoxemia, aspiration into airway EXAM: PORTABLE CHEST 1 VIEW COMPARISON:  Chest x-ray from yesterday FINDINGS: Again seen are the tracheostomy tube and feeding tube. Cardiomegaly. There has been improvement of the right basal atelectasis. Left basilar atelectasis/infiltration persists. There are some heterogeneous opacities seen in the lungs likely pulmonary edema without significant  interval change. IMPRESSION: Cardiomegaly. Partial resolution of the right basilar atelectasis. Minor atelectasis/infiltration at the left lung base, stable. Heterogeneous opacities in the lungs without significant interval change likely on the basis of pulmonary edema. Electronically Signed   By: Frazier Richards M.D.   On: 09/03/2021 10:18   CT HEAD WO CONTRAST (5MM)  Result Date: 09/03/2021 CLINICAL DATA:  Hydrocephalus EXAM: CT HEAD WITHOUT CONTRAST TECHNIQUE: Contiguous axial images were obtained from the base of the skull through the vertex without intravenous contrast. RADIATION DOSE REDUCTION: This exam was performed according to the departmental dose-optimization program which includes automated exposure control, adjustment of the mA and/or kV according to patient size and/or use of iterative reconstruction technique. COMPARISON:  CT head September 02, 2021. FINDINGS: Brain: No substantial change in the right thalamic hemorrhage. Similar stern edema and mass effect similar position of a right frontal approach ventriculostomy catheter with decompressed ventricular system. No evidence of acute large vascular territory infarct. Vascular: No hyperdense vessel identified. Skull: No acute fracture. Sinuses/Orbits: Air-fluid levels and frothy secretions in bilateral sphenoid sinuses. No acute orbital findings Other: No mastoid effusions. IMPRESSION: 1. No substantial change in right thalamic hemorrhage. Similar surrounding edema  and mass effect. 2. Decompressed ventricles with shunt in place. Electronically Signed   By: Margaretha Sheffield M.D.   On: 09/03/2021 08:15   DG Abd 1 View  Result Date: 09/02/2021 CLINICAL DATA:  Feeding tube placement. EXAM: ABDOMEN - 1 VIEW COMPARISON:  08/27/2021 FINDINGS: 1504 hours. Feeding tube tip is in the distal stomach at the pylorus and directed distally towards the duodenum. Nonspecific bowel gas pattern noted in the visualized upper abdomen. IMPRESSION: Feeding tube tip is in the  distal stomach at the pylorus and directed towards the duodenum. Electronically Signed   By: Misty Stanley M.D.   On: 09/02/2021 15:17    PHYSICAL EXAM  Physical Exam  Constitutional: Appears well-developed and well-nourished middle-age African-American lady.  Cardiovascular: Normal rate and regular rhythm.  Respiratory: Effort normal, non-labored breathing, on trach collar  Neuro -s/p tracheostomy without sedation, she is awake with stimulation with eyes open with mild left ptosis, able to follow simple commands.  Left gaze preference but able to overcome. PERRL. Gag and cough present.  .  Face appears symmetrical.  Tongue protrusion midline.   Right side upper and lower extremity spontaneous movement, left upper and lower extremity hemiplegia  Sensation, coordination not cooperative and gait not tested.   ASSESSMENT/PLAN [name] is a 56 y.o. female with history of ESRD on HD MWF, CHF, HTN, DM presenting with nausea, vomiting, left side weakness present upon waking. Intubated emergently at [facility] and transferred to [facility] on 5/59.  NSGY consulted, EVD placement 5/29. MRI shows stable size in right thalamic hemorrhage with edema extending to the pons and midbrain. EVD still in place. Extubated 08/26/2021. Reintubated 08/27/2021 and then tracheostomy placed. Consult for PEG tube sent to trauma team.   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 Small volume intraventricular extension of hemorrhage. Mild hydrocephalus. 5/29 Head CT 2200- decrease in size of lateral and third ventricles s/p EVD placement  CTA head & neck No arterial lesion underlying the right thalamic hematoma. Equivocal  for spot sign which would be an adverse finding suggesting continued growth MRI  no change in size of the right thalamic intraparenchymal hemorrhage extending to midbrain, maximal dimension 3.6 cm by MRI. Surrounding edema, likely slightly more prominent. 2D Echo EF 60-65%, consistent with grade II diastolic dysfunction EEG - mild diffuse encephalopathy LDL 38 HgbA1c 6.2 VTE prophylaxis - SCDs aspirin 81 mg daily prior to admission, now on No antithrombotic.  Therapy recommendations:  CIR Disposition:  Pending  Obstructive Hydrocephalus NSGY on board 5/29- EVD placement  Follow up CT shows slightly decrease in size of lateral and third ventricle  EVD at 25 cm H2O Unable to tolerate clamping trial with nausea and vomiting Plan for increase level and attempt clamping soon.   Cerebral edema On 3% saline @ 75- d/c'd Given ESRD, try to avoid fluid overload, will d/c 3% saline Na goal 150-155 Na 142->146->141->147 -> 150-> 136  Acute Hypoxic Respiratory failure Intubated 5/29 CCM managing vent Extubated 6/1 Reintubated and s/p trach 6/2  Hypertensive emergency Home meds:  labetolol, hydralazine, losartan Stable BP less than 160 On losartan 100, labetalol 200 3 times daily, hydralazine 50 3 times daily  Add amlodipine 10 Long-term BP goal normotensive Still on cleviprex, taper off as able  End Stage Renal Failure on hemodialysis MWF Nephrology consulted Dialysis done 5/30 Monitor BMP/electrolytes  Diabetes Mellitus A1c 6.2, controlled CBGs SSI Close PCP follow-up  Other Stroke Risk Factors Obesity, Body mass index is 33.77 kg/m., BMI >/= 30 associated with increased stroke risk, recommend weight loss, diet and exercise as appropriate  Congestive heart failure  Other Active Problems Hyperkalemia  K 6.1->4.2-> 3.5, s/p HD and Lokelma Dysphagia - cortrak placed on [facility] day # 11  Patient seen and examined by NP/APP with MD. MD to update note as needed.    [place] , MSN, AGACNP-BC [facility] See Amion for schedule and pager information 09/03/2021 1:05 PM   I have personally obtained history,examined this patient, reviewed notes, independently viewed imaging studies, participated in medical decision making and plan of care.ROS completed by me personally and pertinent positives fully documented  I have made any additions or clarifications directly to the above note. Agree with note above.  Patient continues to do well neurologically with persistent dense left hemiplegia and remains on trach collar and weaned off ventilatory support.  Ventriculostomy catheter is being weaned and hopefully will remove early next week managed with surgery.  No family available at the bedside. for discussion.  Discussed with Dr. Lynetta Mare critical care medicine.This patient is critically ill and at significant risk of neurological worsening, death and care requires constant monitoring of vital signs, hemodynamics,respiratory and cardiac monitoring, extensive review of multiple databases, frequent neurological assessment, discussion with family, other specialists and medical decision making of high complexity.I have made any additions or clarifications directly to the above note.This critical care time does not reflect procedure time, or teaching time or supervisory time of PA/NP/Med Resident etc but could involve care discussion time.  I spent 30 minutes of neurocritical care time  in the care of  this patient.       Antony Contras, MD Medical Director [facility] Pager: [phone] 09/03/2021 3:08 PM   To contact Stroke Continuity provider, please refer to [email or web address] After hours, contact General Neurology

## 2021-09-03 NOTE — Progress Notes (Signed)
Patient ID: Janet Mitchell, female   DOB: [birth year], 56 y.o.   MRN: [id number] [facility] Progress Note   Assessment/ Plan:   1.  Right thalamic intracranial hemorrhage: With intraventricular hemorrhage and hydrocephalus and now status post ventriculostomy drain placement by neurosurgery.  Attempted clamping unsuccessful with headache/nausea/vomiting recurrent and continues to require ventriculostomy with efforts at raising pressure to decide on ability to read clamp/discontinue ventriculostomy. 2. ESRD: She is usually on a Monday/Wednesday/Friday dialysis schedule and earlier this week underwent frequent hemodialysis/ultrafiltration to help improve volume status/respiratory status.  On schedule for hemodialysis today. 3. Anemia: Hemoglobin and hematocrit currently acceptable, continue to follow trend to decide on need to restart ESA. 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 5. Nutrition: Previously on tube feeds that were held after recurrent emesis.  Now on D10W drip. 6. Hypertension: Blood pressure currently at acceptable range, continue to monitor on current antihypertensive therapy and hemodialysis.  Subjective:   No acute clinical events noted overnight with tube feeds turned off and started on D10W   Objective:   BP 127/73 (BP Location: Right Arm)   Pulse 76   Temp 99.2 F (37.3 C) (Axillary)   Resp 19   Ht '5\' 7"'$  (1.702 m)   Wt 97.8 kg   SpO2 95%   BMI 33.77 kg/m   Physical Exam: Gen: Appears comfortable resting in bed and attempts to communicate with right hand motioning CVS: Pulse regular rhythm, normal rate, S1 and S2 normal Resp: On trach collar, anteriorly clear to auscultation, no rales/rhonchi Abd: Soft, obese, nontender, bowel sounds normal Ext: Trace lower extremity edema with protective heel dressings, left upper arm AV fistula with intact dressings and pulsatile  Labs: BMET Recent Labs   Lab 08/27/21 1123 08/28/21 0435 08/29/21 0514 08/30/21 0441 09/01/21 1656 09/02/21 1119  NA 150* 140 137 136 134* 134*  K 3.7 3.1* 3.7 4.0 3.9 4.1  CL  --  102 99 98 90* 91*  CO2  --  '27 28 25 28 26  '$ GLUCOSE  --  111* 151* 148* 167* 221*  BUN  --  19 18 33* 38* 36*  CREATININE  --  4.05* 3.65* 5.30* 5.23* 4.62*  CALCIUM  --  8.2* 8.3* 8.4* 9.3 9.3  PHOS  --  4.0 3.5 4.8*  --   --    CBC Recent Labs  Lab 08/27/21 1123 08/28/21 0435 09/01/21 1656 09/02/21 1119  WBC  --  6.3 8.1 10.6*  NEUTROABS  --   --   --  7.6  HGB 10.9* 10.2* 9.8* 10.4*  HCT 32.0* 33.3* 31.1* 32.8*  MCV  --  79.7* 78.3* 77.9*  PLT  --  89* 124* 128*    Medications:     amantadine  200 mg Per Tube Weekly   amLODipine  10 mg Per Tube Daily   calcitRIOL  1 mcg Per Tube Q M,W,F   chlorhexidine  15 mL Mouth Rinse BID   Chlorhexidine Gluconate Cloth  6 each Topical Q0600   fentaNYL (SUBLIMAZE) injection  200 mcg Intravenous Once   fiber  1 packet Per Tube BID   heparin injection (subcutaneous)  5,000 Units Subcutaneous Q8H   hydrALAZINE  100 mg Per Tube Q8H   insulin aspart  0-6 Units Subcutaneous Q4H   insulin aspart  5 Units Subcutaneous Q4H   labetalol  200 mg Per Tube Q8H   losartan  100 mg Per Tube Daily  mouth rinse  15 mL Mouth Rinse q12n4p   multivitamin  1 tablet Per Tube QHS   Elmarie Shiley, MD 09/03/2021, 9:01 AM

## 2021-09-03 NOTE — Evaluation (Signed)
Clinical/Bedside Swallow Evaluation Patient Details  Name: Janet Mitchell MRN: [id number] Date of Birth: 1965-05-12  Today's Date: 09/03/2021 Time: SLP Start Time (ACUTE ONLY): 12 SLP Stop Time (ACUTE ONLY): 1751 SLP Time Calculation (min) (ACUTE ONLY): 20 min  Past Medical History:  Past Medical History:  Diagnosis Date   Abdominal wall hematoma 12/26/2019   Anemia    Arthritis    CHF (congestive heart failure) ([place])    pt states she has been cleared of heart failure/disease   Chronic cholecystitis with calculus    COVID-19 03/26/2019   COVID-19 virus infection 03/2019   ESRD (end stage renal disease) on dialysis [facility])    Dialysis M-W-F   Headache    "a few/wk" (03/09/2018) - no longer having these   History of blood transfusion 10/2017   "low blood count" (03/09/2018)   Hypertension    Seasonal allergies    Spinal headache    Type II diabetes mellitus ([place])    Past Surgical History:  Past Surgical History:  Procedure Laterality Date   AMPUTATION TOE Left 2013   Great toe   AV FISTULA PLACEMENT Left 07/22/2019   Procedure: LEFT ARM BASILIC ARTERIOVENOUS (AV) FISTULA CREATION;  Surgeon: Rosetta Posner, MD;  Location: [place];  Service: Vascular;  Laterality: Left;   [place] Left 10/17/2019   Procedure: LEFT SECOND STAGE [place];  Surgeon: Rosetta Posner, MD;  Location: [place];  Service: Vascular;  Laterality: Left;   BIOPSY  10/24/2018   Procedure: BIOPSY;  Surgeon: Daneil Dolin, MD;  [facility];  Service: Endoscopy;;  right and left colon   CATARACT EXTRACTION W/ INTRAOCULAR LENS IMPLANT Right    [place]; [place] N/A 03/09/2018   Procedure: LAPAROSCOPIC CHOLECYSTECTOMY WITH INTRAOPERATIVE CHOLANGIOGRAM ERAS PATHWAY;  Surgeon: Donnie Mesa, MD;  Location: [place];  Service: General;  Laterality: N/A;   COLONOSCOPY N/A 10/24/2018   Procedure: COLONOSCOPY;  Surgeon: Daneil Dolin, MD;   [facility];  Service: Endoscopy;  Laterality: N/A;  1:00pm   EYE SURGERY Left 05/15/2018   Removal of blood in the globe (due to DM)   FLEXIBLE SIGMOIDOSCOPY N/A 11/24/2017   Procedure: FLEXIBLE SIGMOIDOSCOPY;  Surgeon: Daneil Dolin, MD;  [facility];  Service: Endoscopy;  Laterality: N/A;   IR FLUORO GUIDE CV LINE RIGHT  07/17/2019   IR PARACENTESIS  07/09/2019   IR US GUIDE VASC ACCESS RIGHT  07/17/2019   LAPAROSCOPIC CHOLECYSTECTOMY  03/09/2018   PERITONEAL CATHETER INSERTION  2017   POLYPECTOMY  10/24/2018   Procedure: POLYPECTOMY;  Surgeon: Daneil Dolin, MD;  [facility];  Service: Endoscopy;;   TOTAL HIP ARTHROPLASTY Left 1997   HPI:  Patient is a 56 y.o. female with PMH: ESRD on HD MWF, CHF, HTN, DM who presented to [facility] ER as a code stroke with c/o nausea and vomiting upon waking up in the morning along with left sided weakness. She was communicative at [facility] but during stay awaiting transfer to [facility] she required emergent intubation due to inability to protect airway. 6/1 CXR showed Diffuse bilateral lung opacities may be due to pulmonary edema or multifocal pneumonia. MRI brain showed right thalamic intraparenchymal hemorrhage  with surrounding edema. She was extubated on 6/1 and although initially tolerated well, quickly was seen with poor, ineffective cough, copious secretions unable to clear independently but in no obvious distress and oxygen saturations 100% on 4L nasal  cannula. She was reintubated on 6/2 to tracheostomy #6 cuffed Shiley.    Assessment / Plan / Recommendation  Clinical Impression  Pt has had some improvements in alertness and volume of secretions, so SLP proceeded with clinical swallow eval. Pt still has significant dysphonia and what appears to be L sided sensory and motor impairments. There was a small collection of saliva in her L buccal cavity with minimal anterior loss, seemingly without pt awareness. Ice chips were provided and  elicited immediate response from the pt, who started masticating them promptly. Some of the thin liquid from the ice also collected and spilled from her L side, but she did trigger a swallow, as suggested by hyolaryngeal movement to palpation. Immediate coughing also followed. Pt will need an instrumental swallow study prior to engaging in much PO, but will f/u for potential readiness as her alertness, use of PMV, and management of secretions all continue to improve. SLP Visit Diagnosis: Dysphagia, unspecified (R13.10)    Aspiration Risk  Moderate aspiration risk;Severe aspiration risk    Diet Recommendation NPO;Alternative means - temporary   Medication Administration: Via alternative means    Other  Recommendations Oral Care Recommendations: Oral care QID Other Recommendations: Have oral suction available    Recommendations for follow up therapy are one component of a multi-disciplinary discharge planning process, led by the attending physician.  Recommendations may be updated based on patient status, additional functional criteria and insurance authorization.  Follow up Recommendations Acute inpatient rehab (3hours/day)      Assistance Recommended at Discharge Frequent or constant Supervision/Assistance  Functional Status Assessment Patient has had a recent decline in their functional status and demonstrates the ability to make significant improvements in function in a reasonable and predictable amount of time.  Frequency and Duration min 3x week  2 weeks       Prognosis Prognosis for Safe Diet Advancement: Good Barriers to Reach Goals: Cognitive deficits;Severity of deficits      Swallow Study   General HPI: Patient is a 56 y.o. female with PMH: ESRD on HD MWF, CHF, HTN, DM who presented to [facility] ER as a code stroke with c/o nausea and vomiting upon waking up in the morning along with left sided weakness. She was communicative at [facility] but during stay awaiting transfer to [facility] she  required emergent intubation due to inability to protect airway. 6/1 CXR showed Diffuse bilateral lung opacities may be due to pulmonary edema or multifocal pneumonia. MRI brain showed right thalamic intraparenchymal hemorrhage  with surrounding edema. She was extubated on 6/1 and although initially tolerated well, quickly was seen with poor, ineffective cough, copious secretions unable to clear independently but in no obvious distress and oxygen saturations 100% on 4L nasal cannula. She was reintubated on 6/2 to tracheostomy #6 cuffed Shiley. Type of Study: Bedside Swallow Evaluation Previous Swallow Assessment: none in chart Diet Prior to this Study: NPO;NG Tube Temperature Spikes Noted: Yes (100.5) Respiratory Status: Trach;Trach Collar Trach Size and Type: Cuff;#6;Deflated;With PMSV in place History of Recent Intubation: Yes Length of Intubations (days): 5 days (across two intubations) Date extubated:  (trach 6/2) Behavior/Cognition: Lethargic/Drowsy;Cooperative;Requires cueing Oral Cavity Assessment: Excessive secretions Oral Care Completed by SLP: Yes Oral Cavity - Dentition: Adequate natural dentition Self-Feeding Abilities: Total assist Patient Positioning: Upright in bed Baseline Vocal Quality: Hoarse;Breathy;Low vocal intensity Volitional Cough: Cognitively unable to elicit Volitional Swallow: Able to elicit    Oral/Motor/Sensory Function Overall Oral Motor/Sensory Function:  (difficulty following commands but with noted L sided weakness)  Ice Chips Ice chips: Impaired Presentation: Spoon Oral Phase Impairments: Reduced labial seal Oral Phase Functional Implications: Left anterior spillage Pharyngeal Phase Impairments: Cough - Immediate;Suspected delayed Swallow   Thin Liquid Thin Liquid: Not tested    Nectar Thick Nectar Thick Liquid: Not tested   Honey Thick Honey Thick Liquid: Not tested   Puree Puree: Not tested   Solid     Solid: Not tested      Osie Bond., M.A.  [place] Office [phone]  Secure chat preferred  09/03/2021,12:33 PM

## 2021-09-03 NOTE — Progress Notes (Signed)
Physical Therapy Treatment Patient Details Name: Janet Mitchell MRN: [id number] DOB: Aug 11, 1965 Today's Date: 09/03/2021   History of Present Illness 56 y.o. female presents to [facility] hospital as a transfer from [place] on 08/23/2021 with complaints of nausea, vomiting, L weakness. Pt required emergent intubation during transfer from [facility]. CTH reveals R thalamic hemorrhage with IVH. Pt underwent R frontal ventriculostomy on 5/29. Extubated 6/1, reintubated 6/2, tracheostomy 6/2. PMH includes HTN, CHF, MDII, ESRD.    PT Comments    Pt intermittently lethargic during session, maintaining eyes closed for most part. Pt demonstrates improved effort to maintain sitting balance, attempting to correct posterior losses of balance with RUE support and responding to verbal cues for head position, however she does continue to require maxA to sit at the edge of bed. Pt shows no AROM of L side and has been slow to progress with mobility to this point. PT updates recommendations to SNF placement as the pt will likely need increased time to attempt to make a functional recovery. Acute PT will continue to follow.   Recommendations for follow up therapy are one component of a multi-disciplinary discharge planning process, led by the attending physician.  Recommendations may be updated based on patient status, additional functional criteria and insurance authorization.  Follow Up Recommendations  Skilled nursing-short term rehab (<3 hours/day)     Assistance Recommended at Discharge Frequent or constant Supervision/Assistance  Patient can return home with the following Two people to help with walking and/or transfers;Two people to help with bathing/dressing/bathroom;Assistance with cooking/housework;Assistance with feeding;Direct supervision/assist for medications management;Direct supervision/assist for financial management;Help with stairs or ramp for entrance;Assist for transportation   Equipment  Recommendations  Hospital bed;Other (comment);Wheelchair (measurements PT);Wheelchair cushion (measurements PT) (hoyer lift)    Recommendations for Other Services       Precautions / Restrictions Precautions Precautions: Fall;Other (comment) Precaution Comments: trach, EVD, cortrack, flexiseal Restrictions Weight Bearing Restrictions: No     Mobility  Bed Mobility Overal bed mobility: Needs Assistance Bed Mobility: Supine to Sit, Sit to Supine     Supine to sit: Total assist, HOB elevated Sit to supine: Total assist, HOB elevated        Transfers                        Ambulation/Gait                   Stairs             Wheelchair Mobility    Modified Rankin (Stroke Patients Only)       Balance Overall balance assessment: Needs assistance Sitting-balance support: Single extremity supported, Feet supported Sitting balance-Leahy Scale: Poor Sitting balance - Comments: maxA, pt demonstrates effort to pull through RUE to maintain balance, does follow verbal cues to lean forward in an effort to correct, without cueing or UE support pt consistently falls backward. Pt sits at edge of bed for ~10 minutes with PT assist Postural control: Posterior lean                                  Cognition Arousal/Alertness: Lethargic (arouses with stimuli but often with eyes closed) Behavior During Therapy: Flat affect Overall Cognitive Status: Difficult to assess  General Comments: pt is alert, eyes closed most of session but responds when stimulated. Pt follows commands inconsistently with increased time        Exercises      General Comments General comments (skin integrity, edema, etc.): VSS, 5L 28% FiO2 trach collar      Pertinent Vitals/Pain Pain Assessment Pain Assessment: Faces Faces Pain Scale: No hurt    Home Living                          Prior Function             PT Goals (current goals can now be found in the care plan section) Acute Rehab PT Goals Patient Stated Goal: not stated Progress towards PT goals: Progressing toward goals (very slow progress)    Frequency    Min 3X/week      PT Plan Discharge plan needs to be updated    Co-evaluation              AM-PAC PT "6 Clicks" Mobility   Outcome Measure  Help needed turning from your back to your side while in a flat bed without using bedrails?: Total Help needed moving from lying on your back to sitting on the side of a flat bed without using bedrails?: Total Help needed moving to and from a bed to a chair (including a wheelchair)?: Total Help needed standing up from a chair using your arms (e.g., wheelchair or bedside chair)?: Total Help needed to walk in hospital room?: Total Help needed climbing 3-5 steps with a railing? : Total 6 Click Score: 6    End of Session Equipment Utilized During Treatment: Oxygen Activity Tolerance: Patient limited by lethargy Patient left: in bed;with call bell/phone within reach;with bed alarm set Nurse Communication: Mobility status;Need for lift equipment PT Visit Diagnosis: Other abnormalities of gait and mobility (R26.89);Other symptoms and signs involving the nervous system (R29.898);Muscle weakness (generalized) (M62.81);Hemiplegia and hemiparesis Hemiplegia - Right/Left: Left Hemiplegia - caused by: Nontraumatic intracerebral hemorrhage     Time: 8469-6295 PT Time Calculation (min) (ACUTE ONLY): 25 min  Charges:  $Therapeutic Activity: 8-22 mins $Neuromuscular Re-education: 8-22 mins                     Zenaida Niece, PT, DPT Acute Rehabilitation Office [phone]    Zenaida Niece 09/03/2021, 4:38 PM

## 2021-09-03 NOTE — Progress Notes (Signed)
NAME:  Janet Mitchell, MRN:  [id number], DOB:  05/26/1965, LOS: 11 ADMISSION DATE:  08/23/2021, CONSULTATION DATE:  08/23/2021 REFERRING MD:  Dr. Rory Mitchell - Neuro CHIEF COMPLAINT: Code Stroke  History of Present Illness:  56 year old woman who presented to [facility] 5/29 with reported left-sided weakness, slurred speech, nausea/vomiting. LKN 6 hours prior to arrival. PMHx significant for HTN, CHF (Echo 07/2021 EF 60-65%, severe LVH, G2DD), T2DM (c/b diabetic neuropathy with subsequent L great toe amputation 2/2 ulcer/infection), ESRD (on HD MWF via [facility]).  On arrival to ED, patient is somnolent with slight disconjugate gaze with near flaccid left upper arm and left leg weakness. BP 222/107. CT Head with 5cc acute hematoma in the right thalamus with intraventricular extension. Neurology consulted and patient transferred to [facility] for further evaluation. PCCM consulted for admission.  On arrival to unit patient is intubated, pupils 75m bilaterally and non-reactive. Withdrawals in all extremities, does not follow commands, on Propofol and Cleviprex gtt.    Pertinent Medical History:  ESRD with HD MWF, CHF, HTN, DM, s/p left great toe amputation secondary to ulcer/infection   Significant Hospital Events: Including procedures, antibiotic start and stop dates in addition to other pertinent events   [facility], more awake, following commands with RUE/RLE 6/1 No issues overnight, she remains alert and interactive on vent.  Extubated 6/2 failed extubation requiring reintubation with eventual establishment of tracheostomy by afternoon 6/4 Placed on ATC yesterday afternoon and has tolerated well since 6/5: EVD raised to 20 cm H20 6/7: Failed EVD clamping trial after developing emesis and headache 6/8: Failed EVD clamping due to emesis 6/9: Increase EVD pressure to 25  Interim History / Subjective:   Oxygen requirement increased  overnight from 5L via trach collar to 8L via trach collar.  Afebrile overnight.  CT head yesterday and this AM with no change compared to prior.   This morning, Mrs. Janet Deforestis awake and alert. She is answering questions appropriately. She denies any pain or discomfort. She denies any nausea or headache.   Objective:  Blood pressure 127/73, pulse 76, temperature 99.2 F (37.3 C), temperature source Axillary, resp. rate 19, height '5\' 7"'$  (1.702 m), weight 97.8 kg, SpO2 95 %.    FiO2 (%):  [28 %-35 %] 35 %   Intake/Output Summary (Last 24 hours) at 09/03/2021 0841 Last data filed at 09/03/2021 0800 Gross per 24 hour  Intake 515.24 ml  Output 449 ml  Net 66.24 ml    Filed Weights   08/31/21 2051 09/01/21 1718 09/03/21 0500  Weight: 99.4 kg 99.2 kg 97.8 kg   Physical Examination:  General: Acutely ill appearing middle aged female now on ATC, in NAD HEENT: 6 cuffed trach midline, MM pink/moist, PERRL,  Neuro: Awake, alert and answers questions appropriately. Able to move RUE and BLE against gravity. Slight movement noticed of left pointer finger only; no other movement of LUE.  CV: s1s2 regular rate and rhythm, no murmur, rubs, or gallops,  PULM: Coarse breathe sounds in bilateral upper anterior lung fields. No increased work of breathing. No tachypnea.  GI: soft, bowel sounds active in all 4 quadrants, non-tender, non-distended Extremities: warm/dry, no edema  Skin: no rashes or lesions  Resolved Hospital Problem List:   Slight Hyperkalemia, K 6.0 Hypernatremia  Foot Ulcer, +Pseudomonas on recent culture HTN Emergency   Assessment & Plan:   Acute Encephalopathy Resolved at this time. Likely sleep-deprivation and  hypotension-related.   Right Thalamus ICH with IVH and Obstructive Hydrocephalus s/p EVD - Repeat MRI brain 5/31 No change in size of the right thalamic intraparenchymal hemorrhage is suspected, maximal dimension 3.6 cm by MRI.  - HTS stopped 6/1 - Failed EVD clamping  trial yesterday and day prior. Per Neurosurgery, will retry clamping trial on Monday if tolerating 25 cm H2O.  P: Maintain neuro protective measures Nutrition and bowel regiment  Seizure precautions  Aspirations precautions  EVD per NSGY Continue with Cortrak at this time while awaiting CIR evaluation  Acute Hypoxic Respiratory Failure  Aspiration Pneumonia - Secondary to above  - Tracheostomy placed on 6/2 due to multiple failed extubations. Tolerating TC since 6/3 AM.  - S/p 5 days of IV Zosyn - Likely aspiration event on 6/7. Subsequently developed low-grade fever.Today, supplemental oxygen requirement increased. CXR overall unchanged though. Hold off on antibiotics.  P: Continue supplemental oxygen via trach collar to maintain O2 saturation above 90% Routine trach care  Head of bed elevated 30 degrees Minimize sedation  Hypertension  - Goal SBP < 160 per Stroke - Blood pressure low yesterday. Amlodipine and Losartan have been held the last 72 hours. Clonidine has been held the last 24 hours and she has only received 1 dose of Labetalol and Hydralazine in the last 24 hours. P: Continue Norvasc 10 mg daily, Labetalol 200 mg TID Decrease Losartan to 25 mg daily Discontinue Hydralazine and Clonidine Monitor BP closely Continuous telemetry   ESRD on HD MWF  P: Nephrology following for HD management Avoid nephrotoxins Ensure adequate renal perfusion   DM P: SSI q4h CBG goal 140-180 CBG checks q4hrs  Best Practice: (right click and "Reselect all SmartList Selections" daily)   Diet/type: NPO DVT prophylaxis: Heparin GI prophylaxis: N/A Lines: N/A Foley:  N/A Code Status:  full code Last date of multidisciplinary goals of care discussion [Per primary]   Dr. Jose Mitchell Internal Medicine PGY-3   09/03/2021, 8:41 AM

## 2021-09-03 NOTE — Progress Notes (Signed)
Inpatient Rehab Admissions Coordinator:    CIR continues to follow at a distance. Pt is not yet medically ready or tolerating therapy well enough for CIR.   Clemens Catholic, [place], [place] Admissions Coordinator  [phone] ([place]) [phone] (office)

## 2021-09-03 NOTE — Progress Notes (Signed)
Speech Language Pathology Treatment: Nada Boozer Speaking valve  Patient Details Name: Janet Mitchell MRN: [id number] DOB: [birth year] Today's Date: 09/03/2021 Time: 9038-3338 SLP Time Calculation (min) (ACUTE ONLY): 20 min  Assessment / Plan / Recommendation Clinical Impression  Pt was seen for PMV tx, keeping her eyes closed but still more interactive than this SLP's last visit. Pt sounded less wet at baseline and with PMV donned, although with mild amounts of saliva pooling in her L buccal cavity. Pt is trying to communicate, but intelligibility is very limited. She does not initiate phonation much without cueing, and once cued, produces very soft, dysphonic voicing. Her volume can only be raised a little with cues, and it is only sustained for a few seconds at most. Given mentation, would continue to use PMV with SLP.   HPI HPI: Patient is a 56 y.o. female with PMH: ESRD on HD MWF, CHF, HTN, DM who presented to [facility] ER as a code stroke with c/o nausea and vomiting upon waking up in the morning along with left sided weakness. She was communicative at [facility] but during stay awaiting transfer to [facility] she required emergent intubation due to inability to protect airway. 6/1 CXR showed Diffuse bilateral lung opacities may be due to pulmonary edema or multifocal pneumonia. MRI brain showed right thalamic intraparenchymal hemorrhage  with surrounding edema. She was extubated on 6/1 and although initially tolerated well, quickly was seen with poor, ineffective cough, copious secretions unable to clear independently but in no obvious distress and oxygen saturations 100% on 4L nasal cannula. She was reintubated on 6/2 to tracheostomy #6 cuffed Shiley.      SLP Plan  Continue with current plan of care      Recommendations for follow up therapy are one component of a multi-disciplinary discharge planning process, led by the attending physician.  Recommendations may be updated based on patient status, additional  functional criteria and insurance authorization.    Recommendations         Patient may use Passy-Muir Speech Valve: with SLP only         Oral Care Recommendations: Oral care QID Follow Up Recommendations: Acute inpatient rehab (3hours/day) Assistance recommended at discharge: Frequent or constant Supervision/Assistance SLP Visit Diagnosis: Aphonia (R49.1) Plan: Continue with current plan of care           Osie Bond., M.A. [place] Office [phone]  Secure chat preferred   09/03/2021, 12:12 PM

## 2021-09-03 NOTE — Progress Notes (Signed)
Today being seventh day since trach was placed. RT removed sutures with no complications.

## 2021-09-04 DIAGNOSIS — J9601 Acute respiratory failure with hypoxia: Secondary | ICD-10-CM | POA: Diagnosis not present

## 2021-09-04 DIAGNOSIS — I629 Nontraumatic intracranial hemorrhage, unspecified: Secondary | ICD-10-CM | POA: Diagnosis not present

## 2021-09-04 LAB — GLUCOSE, CAPILLARY
Glucose-Capillary: 158 mg/dL — ABNORMAL HIGH (ref 70–99)
Glucose-Capillary: 198 mg/dL — ABNORMAL HIGH (ref 70–99)
Glucose-Capillary: 216 mg/dL — ABNORMAL HIGH (ref 70–99)
Glucose-Capillary: 217 mg/dL — ABNORMAL HIGH (ref 70–99)
Glucose-Capillary: 231 mg/dL — ABNORMAL HIGH (ref 70–99)
Glucose-Capillary: 258 mg/dL — ABNORMAL HIGH (ref 70–99)

## 2021-09-04 MED ORDER — GERHARDT'S BUTT CREAM
TOPICAL_CREAM | CUTANEOUS | Status: DC | PRN
  Administered 2021-09-04: 1 via TOPICAL
  Filled 2021-09-04: qty 1

## 2021-09-04 NOTE — Progress Notes (Signed)
Patient ID: Janet Mitchell, female   DOB: Dec 06, 1965, 56 y.o.   MRN: [id number]  [facility] Progress Note   Assessment/ Plan:   1.  Right thalamic intracranial hemorrhage: With intraventricular hemorrhage and hydrocephalus and now status post ventriculostomy drain placement by neurosurgery.  Attempted clamping unsuccessful with headache/nausea/vomiting recurrent and continues to require ventriculostomy with efforts at raising pressure to decide on ability to read clamp/discontinue ventriculostomy. 2. ESRD: She is usually on a Monday/Wednesday/Friday dialysis schedule with euvolemic state attained by frequent dialysis earlier this week.  Yesterday underwent elective hemodialysis and next dialysis due 6/12. 3. Anemia: Hemoglobin and hematocrit currently acceptable, continue to follow trend to decide on need to restart ESA. 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 5. Nutrition: Previously on tube feeds that were held after recurrent emesis.  6. Hypertension: Blood pressure currently at acceptable range, continue to monitor on current antihypertensive therapy and hemodialysis.  Subjective:   Without acute clinical events noted overnight   Objective:   BP 110/63   Pulse 83   Temp 99.3 F (37.4 C) (Oral)   Resp 19   Ht '5\' 7"'$  (1.702 m)   Wt 97.8 kg   SpO2 94%   BMI 33.77 kg/m   Physical Exam: Gen: Somnolent, resting comfortably in bed, difficult to arouse with conversation and moves right hand to pain CVS: Pulse regular rhythm, normal rate, S1 and S2 normal Resp: Status post tracheostomy with coarse/transmitted breath sounds bilaterally over anterior chest  Abd: Soft, obese, nontender, bowel sounds normal Ext: Trace lower extremity edema with protective heel dressings, left upper arm AV fistula with intact dressings and pulsatile  Labs: BMET Recent Labs  Lab 08/29/21 0514 08/30/21 0441 09/01/21 1656  09/02/21 1119  NA 137 136 134* 134*  K 3.7 4.0 3.9 4.1  CL 99 98 90* 91*  CO2 '28 25 28 26  '$ GLUCOSE 151* 148* 167* 221*  BUN 18 33* 38* 36*  CREATININE 3.65* 5.30* 5.23* 4.62*  CALCIUM 8.3* 8.4* 9.3 9.3  PHOS 3.5 4.8*  --   --    CBC Recent Labs  Lab 09/01/21 1656 09/02/21 1119  WBC 8.1 10.6*  NEUTROABS  --  7.6  HGB 9.8* 10.4*  HCT 31.1* 32.8*  MCV 78.3* 77.9*  PLT 124* 128*    Medications:     amantadine  200 mg Per Tube Weekly   amLODipine  10 mg Per Tube Daily   calcitRIOL  1 mcg Per Tube Q M,W,F   chlorhexidine  15 mL Mouth Rinse BID   Chlorhexidine Gluconate Cloth  6 each Topical Q0600   fentaNYL (SUBLIMAZE) injection  200 mcg Intravenous Once   fiber  1 packet Per Tube BID   heparin injection (subcutaneous)  5,000 Units Subcutaneous Q8H   insulin aspart  0-6 Units Subcutaneous Q4H   insulin aspart  5 Units Subcutaneous Q4H   labetalol  200 mg Per Tube Q8H   losartan  25 mg Per Tube Daily   mouth rinse  15 mL Mouth Rinse q12n4p   multivitamin  1 tablet Per Tube QHS   Elmarie Shiley, MD 09/04/2021, 8:42 AM

## 2021-09-04 NOTE — Progress Notes (Signed)
STROKE TEAM PROGRESS NOTE   INTERVAL HISTORY Patient is seen in her room with no family at the bedside.  Her vital signs remain stable and her neurological exam i she is more drowsy today but can be aroused and has purposeful movements but will not follow commands as consistently.Marland Kitchen  EVD is at 25 cm H2O, and may need to lower it to 20 cm to see if there is neurological improvement..  She continues to do well on trach collar.  Her blood pressure is soft but okay today and she is more alert and interactive  Vitals:   09/04/21 1100 09/04/21 1200 09/04/21 1202 09/04/21 1225  BP:    (!) 105/57  Pulse: 81  75 77  Resp: (!) 24  (!) 22   Temp:  (!) 100.4 F (38 C)    TempSrc:  Axillary    SpO2: 95%  94% 96%  Weight:      Height:       CBC:  Recent Labs  Lab 09/01/21 1656 09/02/21 1119  WBC 8.1 10.6*  NEUTROABS  --  7.6  HGB 9.8* 10.4*  HCT 31.1* 32.8*  MCV 78.3* 77.9*  PLT 124* 409*   Basic Metabolic Panel:  Recent Labs  Lab 08/29/21 0514 08/30/21 0441 09/01/21 1656 09/02/21 1119  NA 137 136 134* 134*  K 3.7 4.0 3.9 4.1  CL 99 98 90* 91*  CO2 '28 25 28 26  '$ GLUCOSE 151* 148* 167* 221*  BUN 18 33* 38* 36*  CREATININE 3.65* 5.30* 5.23* 4.62*  CALCIUM 8.3* 8.4* 9.3 9.3  MG 1.7 1.8  --   --   PHOS 3.5 4.8*  --   --    Lipid Panel:  No results for input(s): "CHOL", "TRIG", "HDL", "CHOLHDL", "VLDL", "LDLCALC" in the last 168 hours. HgbA1c:  No results for input(s): "HGBA1C" in the last 168 hours.  Urine Drug Screen: No results for input(s): "LABOPIA", "COCAINSCRNUR", "LABBENZ", "AMPHETMU", "THCU", "LABBARB" in the last 168 hours.  Alcohol Level  No results for input(s): "ETH" in the last 168 hours.   IMAGING past 24 hours No results found.  PHYSICAL EXAM  Physical Exam  Constitutional: Appears well-developed and well-nourished middle-age African-American lady.  Cardiovascular: Normal rate and regular rhythm.  Respiratory: Effort normal, non-labored breathing, on trach  collar  Neuro -s/p tracheostomy without sedation, she is sleepy but with stimulation with eyes open with mild left ptosis, able to follow only occasional simple commands.  Left gaze preference but able to overcome. PERRL. Gag and cough present.  .  Face appears symmetrical.  Tongue protrusion midline.   Right side upper and lower extremity spontaneous movement, left upper and lower extremity hemiplegia  Sensation, coordination not cooperative and gait not tested.   ASSESSMENT/PLAN Ms. Sue Fernicola Pinnix is a 56 y.o. female with history of ESRD on HD MWF, CHF, HTN, DM presenting with nausea, vomiting, left side weakness present upon waking. Intubated emergently at [facility] and transferred to [facility] on 5/59.  NSGY consulted, EVD placement 5/29. MRI shows stable size in right thalamic hemorrhage with edema extending to the pons and midbrain. EVD still in place. Extubated 08/26/2021. Reintubated 08/27/2021 and then tracheostomy placed. Consult for PEG tube sent to trauma team.   ICH:  Right thalamic ICH extending to right midbrain with IVH and mild hydrocephalus s/p EVD, likely secondary to uncontrolled hypertension Code Stroke CT head Right thalamic IVH previously 2.9 x 1.7 x 3.1 cm 5/29 Head CT 1000- Interval significant increase in  size of a an acute hematoma in the right thalamus, now measuring 3.6 x 2.5 x 3.8 cm. Small volume intraventricular extension of hemorrhage. Mild hydrocephalus. 5/29 Head CT 2200- decrease in size of lateral and third ventricles s/p EVD placement  CTA head & neck No arterial lesion underlying the right thalamic hematoma. Equivocal for spot sign which would be an adverse finding suggesting continued growth MRI  no change in size of the right thalamic intraparenchymal hemorrhage extending to midbrain, maximal dimension 3.6 cm by MRI. Surrounding edema, likely slightly more prominent. 2D Echo EF 60-65%, consistent with grade II diastolic dysfunction EEG - mild diffuse  encephalopathy LDL 38 HgbA1c 6.2 VTE prophylaxis - SCDs aspirin 81 mg daily prior to admission, now on No antithrombotic.  Therapy recommendations:  CIR Disposition:  Pending  Obstructive Hydrocephalus NSGY on board 5/29- EVD placement  Follow up CT shows slightly decrease in size of lateral and third ventricle  EVD at 25 cm H2O Unable to tolerate clamping trial with nausea and vomiting Plan for increase level and attempt clamping soon.   Cerebral edema On 3% saline @ 75- d/c'd Given ESRD, try to avoid fluid overload, will d/c 3% saline Na goal 150-155 Na 142->146->141->147 -> 150-> 136  Acute Hypoxic Respiratory failure Intubated 5/29 CCM managing vent Extubated 6/1 Reintubated and s/p trach 6/2  Hypertensive emergency Home meds:  labetolol, hydralazine, losartan Stable BP less than 160 On losartan 100, labetalol 200 3 times daily, hydralazine 50 3 times daily Add amlodipine 10 Long-term BP goal normotensive Still on cleviprex, taper off as able  End Stage Renal Failure on hemodialysis MWF Nephrology consulted Dialysis done 5/30 Monitor BMP/electrolytes  Diabetes Mellitus A1c 6.2, controlled CBGs SSI Close PCP follow-up  Other Stroke Risk Factors Obesity, Body mass index is 33.77 kg/m., BMI >/= 30 associated with increased stroke risk, recommend weight loss, diet and exercise as appropriate  Congestive heart failure  Other Active Problems Hyperkalemia  K 6.1->4.2-> 3.5, s/p HD and Lokelma Dysphagia - cortrak placed on TF  Hospital day # 12  Patient continues to do well neurologically with persistent dense left hemiplegia and remains on trach collar and has been weaned off ventilatory support.  Ventriculostomy catheter is being weaned and hopefully will remove early next week as per neurosurgery.  No family available at the bedside. for discussion.  Discussed with Dr. Lynetta Mare critical care medicine.This patient is critically ill and at significant risk of  neurological worsening, death and care requires constant monitoring of vital signs, hemodynamics,respiratory and cardiac monitoring, extensive review of multiple databases, frequent neurological assessment, discussion with family, other specialists and medical decision making of high complexity.I have made any additions or clarifications directly to the above note.This critical care time does not reflect procedure time, or teaching time or supervisory time of PA/NP/Med Resident etc but could involve care discussion time.  I spent 30 minutes of neurocritical care time  in the care of  this patient.       Antony Contras, MD Medical Director [facility] Pager: [phone] 09/04/2021 1:01 PM   To contact Stroke Continuity provider, please refer to [email or web address] After hours, contact General Neurology

## 2021-09-04 NOTE — Progress Notes (Signed)
NAME:  Janet Mitchell, MRN:  [id number], DOB:  1965/06/29, LOS: 12 ADMISSION DATE:  08/23/2021, CONSULTATION DATE:  08/23/2021 REFERRING MD:  Dr. Rory Percy - Neuro CHIEF COMPLAINT: Code Stroke  History of Present Illness:  56 year old woman who presented to [facility] 5/29 with reported left-sided weakness, slurred speech, nausea/vomiting. LKN 6 hours prior to arrival. PMHx significant for HTN, CHF (Echo 07/2021 EF 60-65%, severe LVH, G2DD), T2DM (c/b diabetic neuropathy with subsequent L great toe amputation 2/2 ulcer/infection), ESRD (on HD MWF via RIJ TDC).  On arrival to ED, patient is somnolent with slight disconjugate gaze with near flaccid left upper arm and left leg weakness. BP 222/107. CT Head with 5cc acute hematoma in the right thalamus with intraventricular extension. Neurology consulted and patient transferred to [facility] for further evaluation. PCCM consulted for admission.  On arrival to unit patient is intubated, pupils 47m bilaterally and non-reactive. Withdrawals in all extremities, does not follow commands, on Propofol and Cleviprex gtt.    Pertinent Medical History:  ESRD with HD MWF, CHF, HTN, DM, s/p left great toe amputation secondary to ulcer/infection   Significant Hospital Events: Including procedures, antibiotic start and stop dates in addition to other pertinent events   [facility], more awake, following commands with RUE/RLE 6/1 No issues overnight, she remains alert and interactive on vent.  Extubated 6/2 failed extubation requiring reintubation with eventual establishment of tracheostomy by afternoon 6/4 Placed on ATC yesterday afternoon and has tolerated well since 6/5: EVD raised to 20 cm H20 6/7: Failed EVD clamping trial after developing emesis and headache 6/8: Failed EVD clamping due to emesis 6/9: Increase EVD pressure to 25   Interim History / Subjective:   Remains on trach collar. More  somnolent with drain at 25cm. Last received fentanyl at 1am.   Objective:  Blood pressure 105/62, pulse 82, temperature (!) 100.9 F (38.3 C), temperature source Axillary, resp. rate 19, height '5\' 7"'$  (1.702 m), weight 97.8 kg, SpO2 94 %.    FiO2 (%):  [35 %] 35 %   Intake/Output Summary (Last 24 hours) at 09/04/2021 1014 Last data filed at 09/04/2021 1000 Gross per 24 hour  Intake 1550 ml  Output 2312 ml  Net -762 ml    Filed Weights   08/31/21 2051 09/01/21 1718 09/03/21 0500  Weight: 99.4 kg 99.2 kg 97.8 kg   Physical Examination:  General: Acutely ill appearing middle aged female now on ATC, in NAD HEENT: 6 cuffed trach midline, MM pink/moist, PERRL,  Neuro: Awake, somnolent but will answer appropriatelyAble to move RUE and BLE against gravity. Slight movement noticed of left pointer finger only; no other movement of LUE.  CV: s1s2 regular rate and rhythm, no murmur, rubs, or gallops,  PULM: Coarse breathe sounds in bilateral upper anterior lung fields. No increased work of breathing. No tachypnea.  GI: soft, bowel sounds active in all 4 quadrants, non-tender, non-distended Extremities: warm/dry, no edema  Skin: no rashes or lesions  Assessment & Plan:   Intracranial hemorrhage (HCC) Large right thalamic bleed Patient able to follow commands on right side.  Left side plegic. Appears to be able to protect airway Awake but still has lid apraxia.  Follows commands MRI shows stable bleed and ventricular size, small increase in surrounding edema compared to admission CT.   - Tracheostomy in place for airway protection.  - More awake now so will hold off on PEG tube pending  SLP evaluation.  - Decrease neurocheck frequency to allow for more restorative sleep.   Acquired obstructive hydrocephalus (HCC) Due to right thalamic bleed. EVD now in place. Failed attempt at clamping with nausea and headache Still draining at 25 cmH2O and more somnolent.    - Hold on clamping trial.  Decrease drain to 20  - May require ventriculostomy.     Hypertensive emergency BP well controlled on enteral medications.  ESRD (end stage renal disease) on dialysis [facility])  -Continue hemodialysis as per nephrology.  Acute respiratory failure (HCC) Due to inability to protect airway  -Open ended trach collar trial  - First trach change due 6/8  Best Practice: (right click and "Reselect all SmartList Selections" daily)   Diet/type: tubefeeds DVT prophylaxis: Heparin GI prophylaxis: N/A Lines: N/A Foley:  N/A Code Status:  full code Last date of multidisciplinary goals of care discussion [Per primary]  Kipp Brood, MD [facility] ICU Physician [place]  Pager: [phone] Or Epic Secure Chat After hours: [phone].  09/04/2021, 10:16 AM      09/04/2021, 10:14 AM

## 2021-09-04 NOTE — Progress Notes (Signed)
Overall stable.  No change in status.  Ventriculostomy working well.  Continue efforts at ventriculostomy weaning.

## 2021-09-05 DIAGNOSIS — J9601 Acute respiratory failure with hypoxia: Secondary | ICD-10-CM | POA: Diagnosis not present

## 2021-09-05 DIAGNOSIS — I629 Nontraumatic intracranial hemorrhage, unspecified: Secondary | ICD-10-CM | POA: Diagnosis not present

## 2021-09-05 LAB — GLUCOSE, CAPILLARY
Glucose-Capillary: 155 mg/dL — ABNORMAL HIGH (ref 70–99)
Glucose-Capillary: 210 mg/dL — ABNORMAL HIGH (ref 70–99)
Glucose-Capillary: 196 mg/dL — ABNORMAL HIGH (ref 70–99)
Glucose-Capillary: 186 mg/dL — ABNORMAL HIGH (ref 70–99)
Glucose-Capillary: 221 mg/dL — ABNORMAL HIGH (ref 70–99)

## 2021-09-05 LAB — PHOSPHORUS: Phosphorus: 4.3 mg/dL (ref 2.5–4.6)

## 2021-09-05 LAB — CBC
HCT: 31.8 % — ABNORMAL LOW (ref 36.0–46.0)
Hemoglobin: 10 g/dL — ABNORMAL LOW (ref 12.0–15.0)
MCH: 24.3 pg — ABNORMAL LOW (ref 26.0–34.0)
MCHC: 31.4 g/dL (ref 30.0–36.0)
MCV: 77.4 fL — ABNORMAL LOW (ref 80.0–100.0)
Platelets: 192 10*3/uL (ref 150–400)
RBC: 4.11 MIL/uL (ref 3.87–5.11)
RDW: 16 % — ABNORMAL HIGH (ref 11.5–15.5)
WBC: 7.9 10*3/uL (ref 4.0–10.5)
nRBC: 0 % (ref 0.0–0.2)

## 2021-09-05 LAB — HEPATITIS B CORE ANTIBODY, TOTAL: Hep B Core Total Ab: NONREACTIVE

## 2021-09-05 LAB — HEPATITIS B SURFACE ANTIGEN: Hepatitis B Surface Ag: NONREACTIVE

## 2021-09-05 LAB — BASIC METABOLIC PANEL
Anion gap: 17 — ABNORMAL HIGH (ref 5–15)
BUN: 58 mg/dL — ABNORMAL HIGH (ref 6–20)
CO2: 27 mmol/L (ref 22–32)
Calcium: 9.7 mg/dL (ref 8.9–10.3)
Chloride: 88 mmol/L — ABNORMAL LOW (ref 98–111)
Creatinine, Ser: 6.31 mg/dL — ABNORMAL HIGH (ref 0.44–1.00)
GFR, Estimated: 7 mL/min — ABNORMAL LOW (ref >60)
Glucose, Bld: 158 mg/dL — ABNORMAL HIGH (ref 70–99)
Potassium: 5 mmol/L (ref 3.5–5.1)
Sodium: 132 mmol/L — ABNORMAL LOW (ref 135–145)

## 2021-09-05 LAB — HEPATITIS B SURFACE ANTIBODY,QUALITATIVE: Hep B S Ab: REACTIVE — AB

## 2021-09-05 LAB — HEPATITIS C ANTIBODY: HCV Ab: NONREACTIVE

## 2021-09-05 NOTE — Progress Notes (Signed)
NAME:  Janet Mitchell, MRN:  [id number], DOB:  1965-07-13, LOS: 13 ADMISSION DATE:  08/23/2021, CONSULTATION DATE:  08/23/2021 REFERRING MD:  Dr. Rory Percy - Neuro CHIEF COMPLAINT: Code Stroke  History of Present Illness:  56 year old woman who presented to [facility] 5/29 with reported left-sided weakness, slurred speech, nausea/vomiting. LKN 6 hours prior to arrival. PMHx significant for HTN, CHF (Echo 07/2021 EF 60-65%, severe LVH, G2DD), T2DM (c/b diabetic neuropathy with subsequent L great toe amputation 2/2 ulcer/infection), ESRD (on HD MWF via RIJ TDC).  On arrival to ED, patient is somnolent with slight disconjugate gaze with near flaccid left upper arm and left leg weakness. BP 222/107. CT Head with 5cc acute hematoma in the right thalamus with intraventricular extension. Neurology consulted and patient transferred to [facility] for further evaluation. PCCM consulted for admission.  On arrival to unit patient is intubated, pupils 80m bilaterally and non-reactive. Withdrawals in all extremities, does not follow commands, on Propofol and Cleviprex gtt.    Pertinent Medical History:  ESRD with HD MWF, CHF, HTN, DM, s/p left great toe amputation secondary to ulcer/infection   Significant Hospital Events: Including procedures, antibiotic start and stop dates in addition to other pertinent events   [facility], more awake, following commands with RUE/RLE 6/1 No issues overnight, she remains alert and interactive on vent.  Extubated 6/2 failed extubation requiring reintubation with eventual establishment of tracheostomy by afternoon 6/4 Placed on ATC yesterday afternoon and has tolerated well since 6/5: EVD raised to 20 cm H20 6/7: Failed EVD clamping trial after developing emesis and headache 6/8: Failed EVD clamping due to emesis 6/9: Increase EVD pressure to 25   Interim History / Subjective:   Remains on trach collar. More  somnolent with drain at 25cm. Last received fentanyl at 1am.   Objective:  Blood pressure 118/64, pulse 77, temperature 99.4 F (37.4 C), temperature source Oral, resp. rate (!) 25, height '5\' 7"'$  (1.702 m), weight 97.6 kg, SpO2 93 %.    FiO2 (%):  [35 %] 35 %   Intake/Output Summary (Last 24 hours) at 09/05/2021 0955 Last data filed at 09/05/2021 0953 Gross per 24 hour  Intake 1680 ml  Output 295 ml  Net 1385 ml    Filed Weights   09/01/21 1718 09/03/21 0500 09/05/21 0500  Weight: 99.2 kg 97.8 kg 97.6 kg   Physical Examination:  General: Acutely ill appearing middle aged female now on ATC, in NAD HEENT: 6 cuffed trach midline, MM pink/moist, PERRL,  Neuro: Awake, somnolent but will answer appropriatelyAble to move RUE and BLE against gravity. Slight movement noticed of left pointer finger only; no other movement of LUE.  CV: s1s2 regular rate and rhythm, no murmur, rubs, or gallops,  PULM: Coarse breathe sounds in bilateral upper anterior lung fields. No increased work of breathing. No tachypnea.  GI: soft, bowel sounds active in all 4 quadrants, non-tender, non-distended Extremities: warm/dry, no edema  Skin: no rashes or lesions  Assessment & Plan:   Intracranial hemorrhage (HCC) Large right thalamic bleed Patient able to follow commands on right side.  Left side plegic. Appears to be able to protect airway Awake but still has lid apraxia.  Follows commands MRI shows stable bleed and ventricular size, small increase in surrounding edema compared to admission CT.   - Tracheostomy in place for airway protection.  - More awake now so will hold off on PEG tube pending  SLP evaluation.  - Decrease neurocheck frequency to allow for more restorative sleep.   Acquired obstructive hydrocephalus (HCC) Due to right thalamic bleed. EVD now in place. Failed attempt at clamping with nausea and headache Still draining at 25 cmH2O and more somnolent.    - Hold on clamping trial. Does  better with EVD at 84  - May require ventriculostomy.   Hypertensive emergency BP well controlled on enteral medications.  ESRD (end stage renal disease) on dialysis [facility])  -Continue hemodialysis as per nephrology.  Acute respiratory failure (HCC) Due to inability to protect airway  -Open ended trach collar trial  - First trach change due 6/8  Best Practice: (right click and "Reselect all SmartList Selections" daily)   Diet/type: tubefeeds DVT prophylaxis: Heparin tid GI prophylaxis: N/A Lines: N/A Foley:  N/A Code Status:  full code Last date of multidisciplinary goals of care discussion [Per primary]  Kipp Brood, MD [facility] ICU Physician [place]  Pager: [phone] Or Epic Secure Chat After hours: [phone].  09/05/2021, 9:55 AM

## 2021-09-05 NOTE — Progress Notes (Signed)
Patient ID: Janet Mitchell, female   DOB: January 10, 1966, 56 y.o.   MRN: [id number] [place] [facility] Progress Note   Assessment/ Plan:   1.  Right thalamic intracranial hemorrhage: With intraventricular hemorrhage and hydrocephalus and now status post ventriculostomy drain placement by neurosurgery.  Ongoing efforts noted for ventriculostomy weaning with sequential raising of closing pressure to determine ability to clamp. 2. ESRD: She is usually on a Monday/Wednesday/Friday dialysis schedule with euvolemic state attained by frequent dialysis earlier this week.  I will order for next hemodialysis treatment again tomorrow.  No acute indications for dialysis today. 3. Anemia: Hemoglobin and hematocrit currently acceptable, continue to follow trend to decide on need to restart ESA. 4. 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 5. Nutrition: On tube feeds with ongoing nutritional supplementation.  6. Hypertension: Blood pressure currently at acceptable range, continue to monitor on current antihypertensive therapy and hemodialysis.  Subjective:   Without acute clinical events noted overnight   Objective:   BP 116/60   Pulse 75   Temp 99.4 F (37.4 C) (Oral)   Resp 18   Ht '5\' 7"'$  (1.702 m)   Wt 97.6 kg   SpO2 92%   BMI 33.70 kg/m   Physical Exam: Gen: Somnolent, does not arouse to calling her name and spontaneously moving right arm CVS: Pulse regular rhythm, normal rate, S1 and S2 normal Resp: Status post tracheostomy with coarse/transmitted breath sounds bilaterally over anterior chest  Abd: Soft, obese, nontender, bowel sounds normal Ext: Trace lower extremity edema with protective heel dressings, left upper arm AV fistula with intact dressings and pulsatile  Labs: BMET Recent Labs  Lab 08/30/21 0441 09/01/21 1656 09/02/21 1119 09/05/21 0334  NA 136 134* 134* 132*  K 4.0 3.9 4.1 5.0  CL 98  90* 91* 88*  CO2 '25 28 26 27  '$ GLUCOSE 148* 167* 221* 158*  BUN 33* 38* 36* 58*  CREATININE 5.30* 5.23* 4.62* 6.31*  CALCIUM 8.4* 9.3 9.3 9.7  PHOS 4.8*  --   --   --    CBC Recent Labs  Lab 09/01/21 1656 09/02/21 1119 09/05/21 0334  WBC 8.1 10.6* 7.9  NEUTROABS  --  7.6  --   HGB 9.8* 10.4* 10.0*  HCT 31.1* 32.8* 31.8*  MCV 78.3* 77.9* 77.4*  PLT 124* 128* 192    Medications:     amantadine  200 mg Per Tube Weekly   amLODipine  10 mg Per Tube Daily   calcitRIOL  1 mcg Per Tube Q M,W,F   chlorhexidine  15 mL Mouth Rinse BID   Chlorhexidine Gluconate Cloth  6 each Topical Q0600   fiber  1 packet Per Tube BID   heparin injection (subcutaneous)  5,000 Units Subcutaneous Q8H   insulin aspart  0-6 Units Subcutaneous Q4H   insulin aspart  5 Units Subcutaneous Q4H   labetalol  200 mg Per Tube Q8H   losartan  25 mg Per Tube Daily   mouth rinse  15 mL Mouth Rinse q12n4p   multivitamin  1 tablet Per Tube QHS   Elmarie Shiley, MD 09/05/2021, 9:04 AM

## 2021-09-05 NOTE — Progress Notes (Addendum)
STROKE TEAM PROGRESS NOTE   INTERVAL HISTORY Patient is seen in her room with no family at the bedside.  Her neurological exam is stable, and she is more alert today with EVD at 20 cm H2O. and draining straw-colored liquid.  May require VP shunt as per neurosurgery vital signs have been stable.  Vital signs stable  Vitals:   09/05/21 0816 09/05/21 0900 09/05/21 1000 09/05/21 1100  BP: 116/60 118/64 (!) 100/55 (!) 146/70  Pulse: 75 77 76 79  Resp: 18 (!) 25 (!) 24 16  Temp:      TempSrc:      SpO2: 92% 93% 92% 94%  Weight:      Height:       CBC:  Recent Labs  Lab 09/02/21 1119 09/05/21 0334  WBC 10.6* 7.9  NEUTROABS 7.6  --   HGB 10.4* 10.0*  HCT 32.8* 31.8*  MCV 77.9* 77.4*  PLT 128* 024    Basic Metabolic Panel:  Recent Labs  Lab 08/30/21 0441 09/01/21 1656 09/02/21 1119 09/05/21 0334 09/05/21 0959  NA 136   < > 134* 132*  --   K 4.0   < > 4.1 5.0  --   CL 98   < > 91* 88*  --   CO2 25   < > 26 27  --   GLUCOSE 148*   < > 221* 158*  --   BUN 33*   < > 36* 58*  --   CREATININE 5.30*   < > 4.62* 6.31*  --   CALCIUM 8.4*   < > 9.3 9.7  --   MG 1.8  --   --   --   --   PHOS 4.8*  --   --   --  4.3   < > = values in this interval not displayed.    Lipid Panel:  No results for input(s): "CHOL", "TRIG", "HDL", "CHOLHDL", "VLDL", "LDLCALC" in the last 168 hours. HgbA1c:  No results for input(s): "HGBA1C" in the last 168 hours.  Urine Drug Screen: No results for input(s): "LABOPIA", "COCAINSCRNUR", "LABBENZ", "AMPHETMU", "THCU", "LABBARB" in the last 168 hours.  Alcohol Level  No results for input(s): "ETH" in the last 168 hours.   IMAGING past 24 hours No results found.  PHYSICAL EXAM  Physical Exam  Constitutional: Appears well-developed and well-nourished middle-age African-American lady.  Cardiovascular: Normal rate and regular rhythm.  Respiratory: Effort normal, non-labored breathing, on trach collar  Neuro -s/p tracheostomy without sedation, she  is sleepy but with stimulation with eyes open with mild left ptosis, able to follow only occasional simple commands.  Left gaze preference but able to overcome. PERRL.  Face appears symmetrical.  Tongue protrusion midline.   Right side upper and lower extremity spontaneous movement, .left upper and lower extremity hemiplegia  Sensation, coordination not cooperative and gait not tested.   ASSESSMENT/PLAN [name] is a 56 y.o. female with history of ESRD on HD MWF, CHF, HTN, DM presenting with nausea, vomiting, left side weakness present upon waking. Intubated emergently at [facility] and transferred to [facility] on 5/59.  NSGY consulted, EVD placement 5/29. MRI shows stable size in right thalamic hemorrhage with edema extending to the pons and midbrain. EVD still in place. Extubated 08/26/2021. Reintubated 08/27/2021 and then tracheostomy placed. Consult for PEG tube sent to trauma team.   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 Small volume intraventricular extension of hemorrhage. Mild hydrocephalus. 5/29 Head CT 2200- decrease in size of lateral and third ventricles s/p EVD placement  CTA head & neck No arterial lesion underlying the right thalamic hematoma. Equivocal for spot sign which would be an adverse finding suggesting continued growth MRI  no change in size of the right thalamic intraparenchymal hemorrhage extending to midbrain, maximal dimension 3.6 cm by MRI. Surrounding edema, likely slightly more prominent. 2D Echo EF 60-65%, consistent with grade II diastolic dysfunction EEG - mild diffuse encephalopathy LDL 38 HgbA1c 6.2 VTE prophylaxis - SCDs aspirin 81 mg daily prior to  admission, now on No antithrombotic.  Therapy recommendations:  CIR Disposition:  Pending  Obstructive Hydrocephalus NSGY on board 5/29- EVD placement  Follow up CT shows slightly decrease in size of lateral and third ventricle  EVD at 20 cm H2O Unable to tolerate clamping trial with nausea and vomiting Plan for increase level and attempt clamping soon.   Cerebral edema On 3% saline @ 75- d/c'd Given ESRD, try to avoid fluid overload, will d/c 3% saline Na goal 150-155 Na 142->146->141->147 -> 150-> 136-> 132  Acute Hypoxic Respiratory failure Intubated 5/29 CCM managing vent Extubated 6/1 Reintubated and s/p trach 6/2  Hypertensive emergency Home meds:  labetolol, hydralazine, losartan Stable BP less than 160 On losartan 100, labetalol 200 3 times daily, hydralazine 50 3 times daily Add amlodipine 10 Long-term BP goal normotensive Still on cleviprex, taper off as able  End Stage Renal Failure on hemodialysis MWF Nephrology consulted Dialysis tomorrow Monitor BMP/electrolytes  Diabetes Mellitus A1c 6.2, controlled CBGs SSI Close PCP follow-up  Other Stroke Risk Factors Obesity, Body mass index is 33.7 kg/m., BMI >/= 30 associated with increased stroke risk, recommend weight loss, diet and exercise as appropriate  Congestive heart failure  Other Active Problems Hyperkalemia  K 6.1->4.2-> 3.5, s/p HD and Lokelma Dysphagia - cortrak placed on [facility] day # [place] , MSN, AGACNP-BC [facility] See Amion for schedule and pager information 09/05/2021 11:33 AM   I have personally obtained history,examined this patient, reviewed notes, independently viewed imaging studies, participated in medical decision making and plan of care.ROS completed by me personally and pertinent positives fully documented  I have made any additions or clarifications directly to the above note. Agree with note above.  Patient neurological exam remained  stable with significant left hemiplegia but she remains ventriculostomy dependent and attempts to wean so far have not been successful.  Await neurosurgical decision about VP shunt next week.  Discussed with Dr. Lynetta Mare critical care medicine Dr. Trenton Gammon neurosurgery.  Discussed with patient's husband at the bedside yesterday..  This patient is critically ill and at significant risk of neurological worsening, death and care requires constant monitoring of vital signs, hemodynamics,respiratory and cardiac monitoring, extensive review of multiple databases, frequent neurological assessment, discussion with family, other specialists and medical decision making of high complexity.I have made any additions or clarifications directly to the above note.This critical care time does not reflect procedure time, or teaching time or supervisory time of PA/NP/Med Resident etc but could involve care discussion time.  I spent 30 minutes of neurocritical care time  in the care of  this patient.  Antony Contras, MD Medical Director [facility] Pager: [phone] 09/05/2021 1:35 PM    To contact Stroke Continuity provider, please refer to [email or web address] After hours, contact General Neurology

## 2021-09-05 NOTE — Progress Notes (Signed)
Much more awake today.  Appears somewhat aware.  No attempts at verbalization.  Ventriculostomy draining straw-colored clear fluid.  Patient status post thalamic hemorrhage with persistent hydrocephalus.  Attempts at ventriculostomy weaning have been unsuccessful.  Patient may need VP shunt placement.  Dr. Marcello Moores to evaluate tomorrow.

## 2021-09-06 ENCOUNTER — Other Ambulatory Visit: Payer: Self-pay | Admitting: Neurosurgery

## 2021-09-06 ENCOUNTER — Inpatient Hospital Stay (HOSPITAL_COMMUNITY): Payer: 59

## 2021-09-06 DIAGNOSIS — R1310 Dysphagia, unspecified: Secondary | ICD-10-CM

## 2021-09-06 DIAGNOSIS — I629 Nontraumatic intracranial hemorrhage, unspecified: Secondary | ICD-10-CM | POA: Diagnosis not present

## 2021-09-06 DIAGNOSIS — J189 Pneumonia, unspecified organism: Secondary | ICD-10-CM

## 2021-09-06 DIAGNOSIS — Z930 Tracheostomy status: Secondary | ICD-10-CM | POA: Diagnosis not present

## 2021-09-06 DIAGNOSIS — J9601 Acute respiratory failure with hypoxia: Secondary | ICD-10-CM | POA: Diagnosis not present

## 2021-09-06 LAB — GLUCOSE, CAPILLARY
Glucose-Capillary: 151 mg/dL — ABNORMAL HIGH (ref 70–99)
Glucose-Capillary: 171 mg/dL — ABNORMAL HIGH (ref 70–99)
Glucose-Capillary: 172 mg/dL — ABNORMAL HIGH (ref 70–99)
Glucose-Capillary: 182 mg/dL — ABNORMAL HIGH (ref 70–99)
Glucose-Capillary: 187 mg/dL — ABNORMAL HIGH (ref 70–99)
Glucose-Capillary: 214 mg/dL — ABNORMAL HIGH (ref 70–99)
Glucose-Capillary: 218 mg/dL — ABNORMAL HIGH (ref 70–99)

## 2021-09-06 LAB — CBC
HCT: 29 % — ABNORMAL LOW (ref 36.0–46.0)
Hemoglobin: 9.3 g/dL — ABNORMAL LOW (ref 12.0–15.0)
MCH: 24.5 pg — ABNORMAL LOW (ref 26.0–34.0)
MCHC: 32.1 g/dL (ref 30.0–36.0)
MCV: 76.5 fL — ABNORMAL LOW (ref 80.0–100.0)
Platelets: 213 10*3/uL (ref 150–400)
RBC: 3.79 MIL/uL — ABNORMAL LOW (ref 3.87–5.11)
RDW: 15.9 % — ABNORMAL HIGH (ref 11.5–15.5)
WBC: 10.3 10*3/uL (ref 4.0–10.5)
nRBC: 0 % (ref 0.0–0.2)

## 2021-09-06 LAB — RENAL FUNCTION PANEL
Albumin: 2.9 g/dL — ABNORMAL LOW (ref 3.5–5.0)
Anion gap: 20 — ABNORMAL HIGH (ref 5–15)
BUN: 85 mg/dL — ABNORMAL HIGH (ref 6–20)
CO2: 25 mmol/L (ref 22–32)
Calcium: 10.2 mg/dL (ref 8.9–10.3)
Chloride: 87 mmol/L — ABNORMAL LOW (ref 98–111)
Creatinine, Ser: 8.11 mg/dL — ABNORMAL HIGH (ref 0.44–1.00)
GFR, Estimated: 5 mL/min — ABNORMAL LOW (ref >60)
Glucose, Bld: 148 mg/dL — ABNORMAL HIGH (ref 70–99)
Phosphorus: 4.6 mg/dL (ref 2.5–4.6)
Potassium: 5.5 mmol/L — ABNORMAL HIGH (ref 3.5–5.1)
Sodium: 132 mmol/L — ABNORMAL LOW (ref 135–145)

## 2021-09-06 LAB — LACTIC ACID, PLASMA
Lactic Acid, Venous: 1.5 mmol/L (ref 0.5–1.9)
Lactic Acid, Venous: 1.4 mmol/L (ref 0.5–1.9)

## 2021-09-06 LAB — MRSA NEXT GEN BY PCR, NASAL: MRSA by PCR Next Gen: NOT DETECTED

## 2021-09-06 LAB — HEPATITIS B SURFACE ANTIBODY, QUANTITATIVE: Hep B S AB Quant (Post): 467.5 m[IU]/mL (ref >9.9)

## 2021-09-06 IMAGING — CT CT HEAD W/O CM
4 series · 16 of 47 positions shown, 18 images · non-contrast
Comparison: Head CT dated [DATE].

CLINICAL DATA: Follow-up right thalamic hemorrhage.



[Series 3: head wo · axial · 0.42mm/px · z∈[-141,-21]mm · 7 of 33 slices shown, 9 images]
[im 5/33  brain]
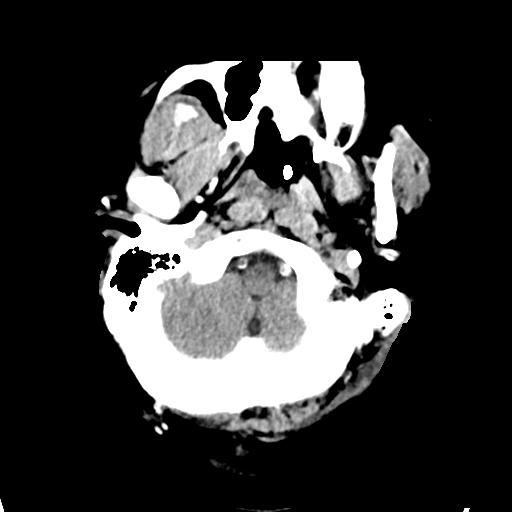
[im 5/33  bone]
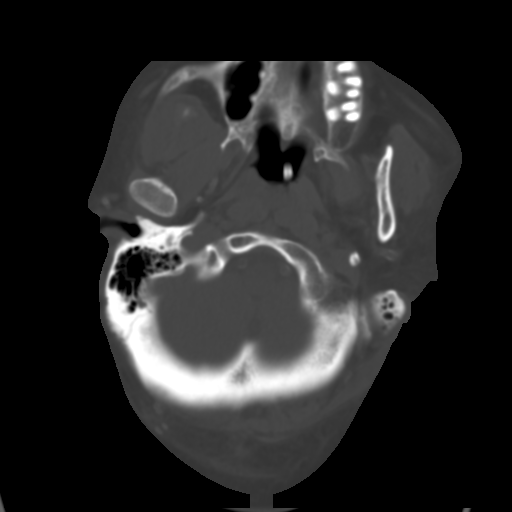
[im 9/33  brain]
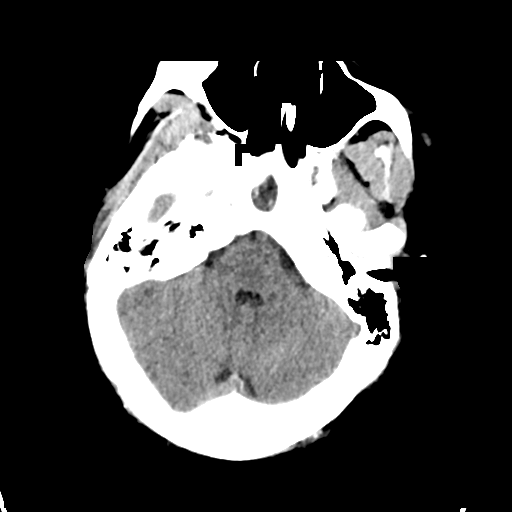
[im 13/33  brain]
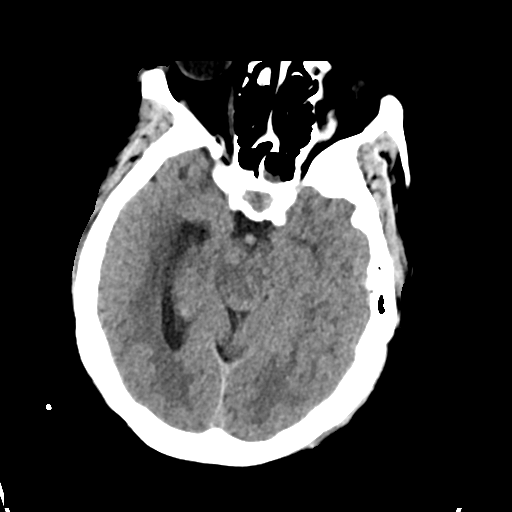
[im 17/33  brain]
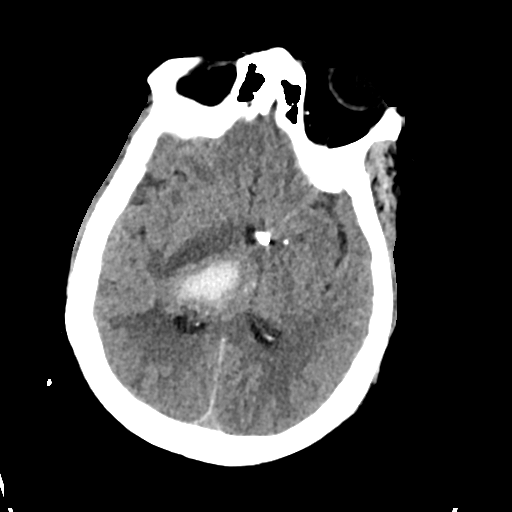
[im 21/33  brain]
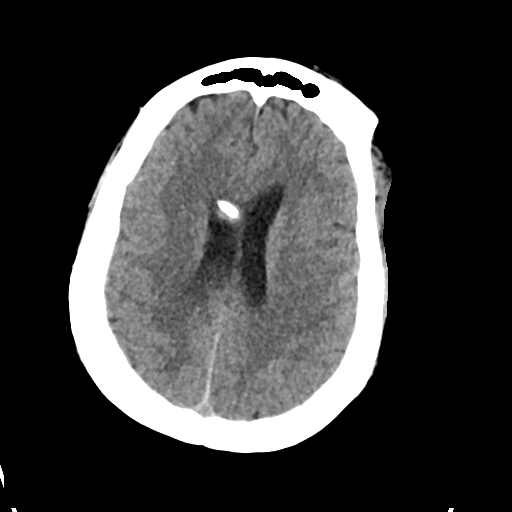
[im 21/33  bone]
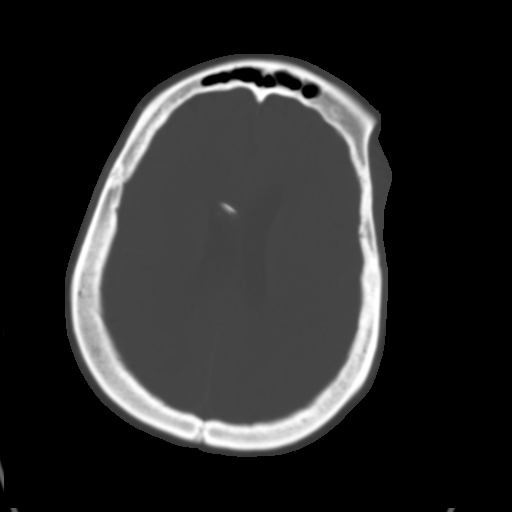
[im 25/33  brain]
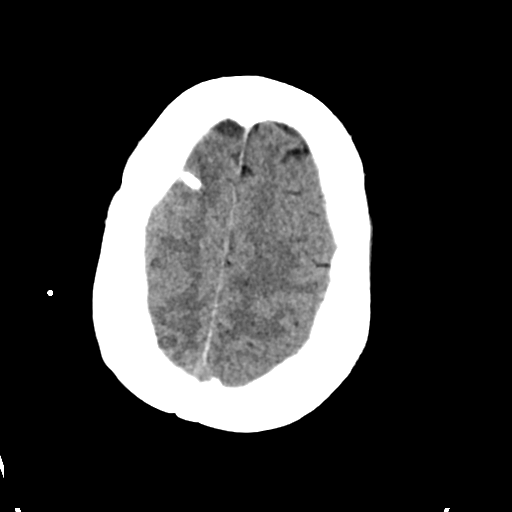
[im 29/33  brain]
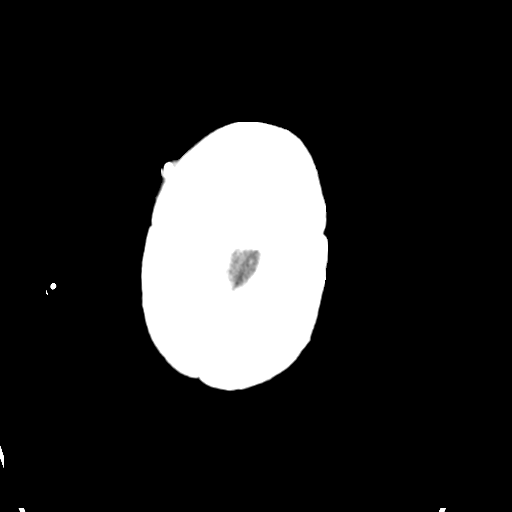

[Series 4: head bone · axial · 0.42mm/px · z∈[-145,-113]mm · 3 of 81 slices shown]
[im 9/81  bone]
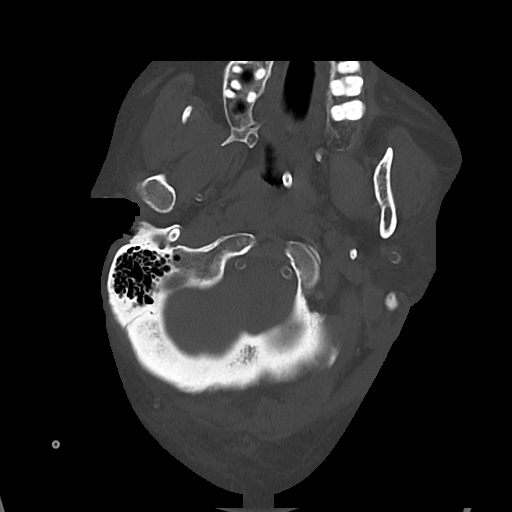
[im 17/81  bone]
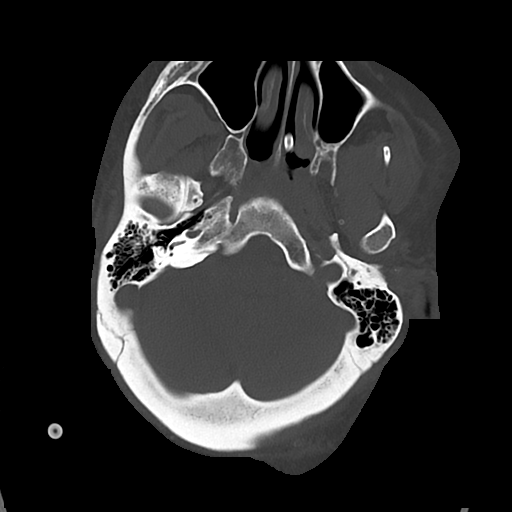
[im 25/81  bone]
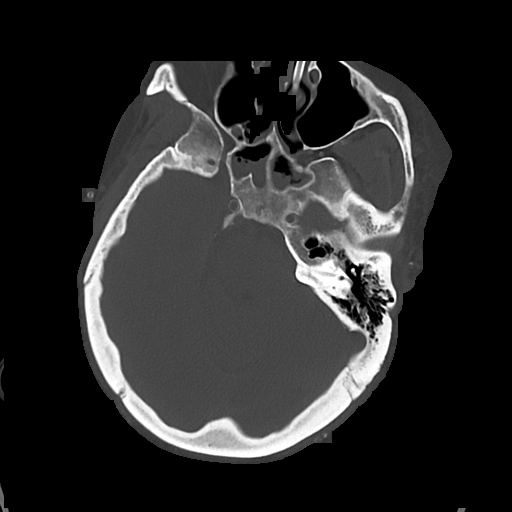

[Series 5: cor soft · coronal · 0.42mm/px · 3 of 68 slices shown]
[im 26/68  brain]
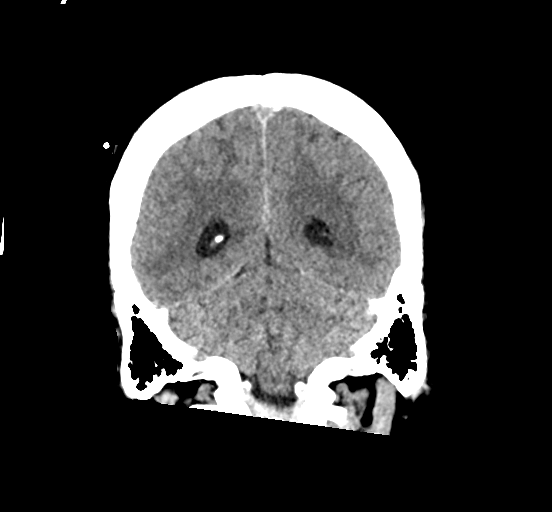
[im 32/68  brain]
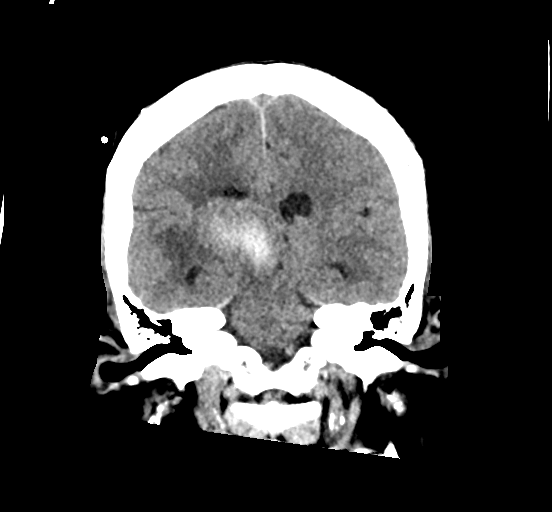
[im 38/68  brain]
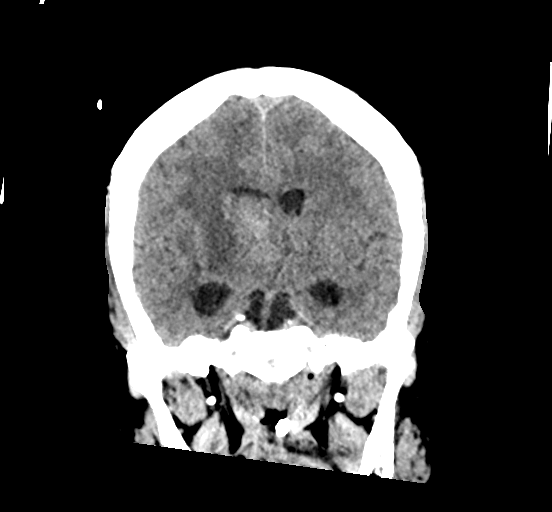

[Series 6: sag soft · sagittal · 0.39mm/px · 3 of 57 slices shown]
[im 20/57  brain]
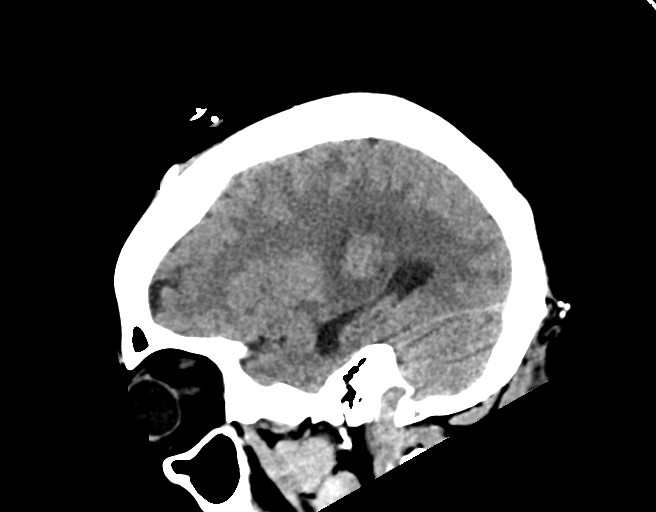
[im 29/57  brain]
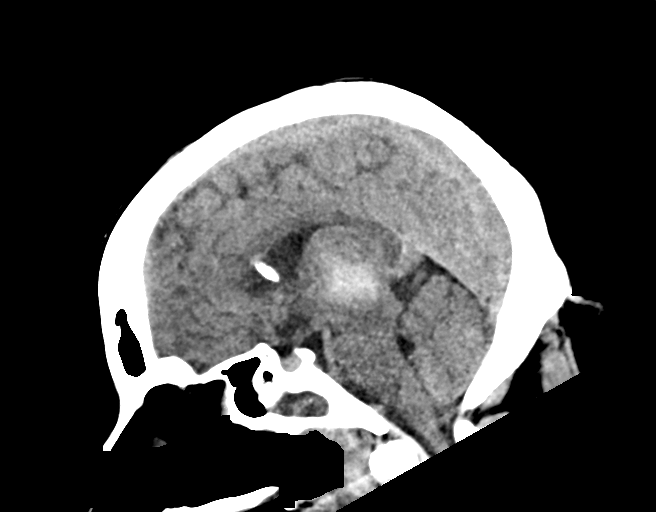
[im 37/57  brain]
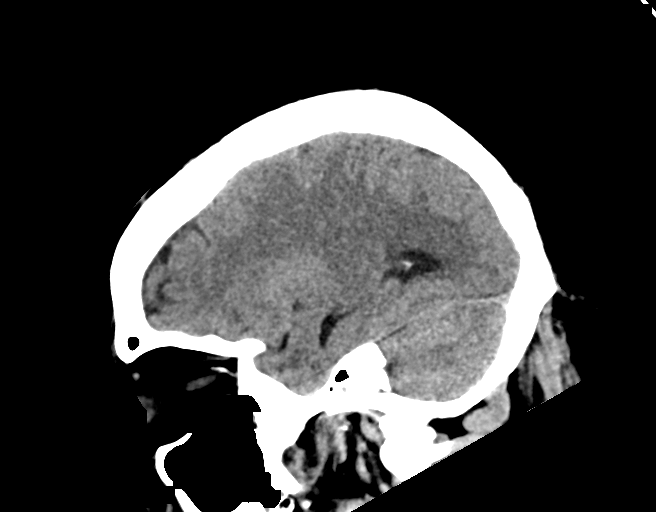

[16 of 47 positions shown; findings below may reference images not displayed]

FINDINGS: Brain: No significant interval change in the right thalamic
hemorrhage with associated mass effect and mild compression of the
third ventricle. There is interval increase in the size of the
lateral ventricles compared to the CT of [DATE]. The lateral
ventricles however appears similar in size to the CT of [DATE].
Right frontal approach ventriculostomy shunt with tip in the left
lateral ventricle. No new hemorrhage. No extra-axial fluid
collection.

Vascular: No hyperdense vessel or unexpected calcification.

Skull: Right frontal burr hole and ventriculostomy. No acute
calvarial pathology.

Sinuses/Orbits: Diffuse mucoperiosteal thickening of paranasal
sinuses with opacification of the sphenoid sinuses. No air-fluid.
The mastoid air cells are clear. A nasogastric tube is partially
visualized.

Other: None
IMPRESSION: 1. No significant interval change in the right thalamic hemorrhage
with associated mass effect and mild compression of the third
ventricle.
2. Slight interval increase in the size of the lateral ventricles
compared to CT of [DATE] which may represent mild hydrocephalus.
The lateral ventricles are however similar in size to the CT of
[DATE]. Continued follow-up recommended. Right frontal approach
ventriculostomy shunt with tip in the left lateral ventricle.

## 2021-09-06 MED ORDER — INSULIN ASPART 100 UNIT/ML IJ SOLN
6.0000 [IU] | INTRAMUSCULAR | Status: DC
  Administered 2021-09-06 – 2021-09-12 (×29): 6 [IU] via SUBCUTANEOUS

## 2021-09-06 MED ORDER — PANTOPRAZOLE 2 MG/ML SUSPENSION
40.0000 mg | Freq: Every day | ORAL | Status: DC
  Administered 2021-09-06 – 2021-10-05 (×26): 40 mg
  Filled 2021-09-06 (×26): qty 20

## 2021-09-06 MED ORDER — PIPERACILLIN-TAZOBACTAM IN DEX 2-0.25 GM/50ML IV SOLN
2.2500 g | Freq: Three times a day (TID) | INTRAVENOUS | Status: DC
  Administered 2021-09-06 – 2021-09-08 (×5): 2.25 g via INTRAVENOUS
  Filled 2021-09-06 (×6): qty 50

## 2021-09-06 MED ORDER — SODIUM CHLORIDE 0.9 % IV SOLN
250.0000 mL | INTRAVENOUS | Status: DC
  Administered 2021-09-06: 250 mL via INTRAVENOUS

## 2021-09-06 MED ORDER — INSULIN ASPART 100 UNIT/ML IJ SOLN
0.0000 [IU] | INTRAMUSCULAR | Status: DC
  Administered 2021-09-06: 2 [IU] via SUBCUTANEOUS
  Administered 2021-09-06 (×2): 3 [IU] via SUBCUTANEOUS
  Administered 2021-09-06 – 2021-09-07 (×2): 2 [IU] via SUBCUTANEOUS
  Administered 2021-09-07: 1 [IU] via SUBCUTANEOUS
  Administered 2021-09-07: 2 [IU] via SUBCUTANEOUS
  Administered 2021-09-07: 1 [IU] via SUBCUTANEOUS
  Administered 2021-09-07 (×2): 2 [IU] via SUBCUTANEOUS
  Administered 2021-09-08: 3 [IU] via SUBCUTANEOUS
  Administered 2021-09-08: 2 [IU] via SUBCUTANEOUS
  Administered 2021-09-08: 1 [IU] via SUBCUTANEOUS
  Administered 2021-09-09: 2 [IU] via SUBCUTANEOUS
  Administered 2021-09-09: 1 [IU] via SUBCUTANEOUS
  Administered 2021-09-09: 3 [IU] via SUBCUTANEOUS
  Administered 2021-09-09: 5 [IU] via SUBCUTANEOUS
  Administered 2021-09-09 – 2021-09-10 (×3): 3 [IU] via SUBCUTANEOUS

## 2021-09-06 MED ORDER — VANCOMYCIN HCL IN DEXTROSE 1-5 GM/200ML-% IV SOLN: 1000.0000 mg | INTRAVENOUS | Status: DC

## 2021-09-06 MED ORDER — VANCOMYCIN HCL 2000 MG/400ML IV SOLN
2000.0000 mg | Freq: Once | INTRAVENOUS | Status: AC
  Administered 2021-09-06: 2000 mg via INTRAVENOUS
  Filled 2021-09-06: qty 400

## 2021-09-06 MED ORDER — VANCOMYCIN HCL IN DEXTROSE 1-5 GM/200ML-% IV SOLN
1000.0000 mg | Freq: Once | INTRAVENOUS | Status: DC
  Filled 2021-09-06: qty 200

## 2021-09-06 MED ORDER — PIPERACILLIN-TAZOBACTAM 3.375 G IVPB 30 MIN
3.3750 g | Freq: Once | INTRAVENOUS | Status: AC
  Administered 2021-09-06: 3.375 g via INTRAVENOUS
  Filled 2021-09-06: qty 50

## 2021-09-06 MED ORDER — NOREPINEPHRINE 4 MG/250ML-% IV SOLN
2.0000 ug/min | INTRAVENOUS | Status: DC
  Administered 2021-09-06: 2 ug/min via INTRAVENOUS
  Filled 2021-09-06: qty 250

## 2021-09-06 NOTE — Progress Notes (Signed)
Inpatient Rehab Admissions Coordinator:    CIR continues to follow at a distance. She is not yet medically ready (on vasopressors, still with EVD, 40% via TC) for CIR and is not yet at a level to tolerate the intensity of CIR (PT now recommending SNF).  Clemens Catholic, [place], [place] Admissions Coordinator  [phone] ([place]) [phone] (office)

## 2021-09-06 NOTE — Progress Notes (Signed)
NAME:  Janet Mitchell, MRN:  [id number], DOB:  12-21-1965, LOS: 32 ADMISSION DATE:  08/23/2021, CONSULTATION DATE:  08/23/2021 REFERRING MD:  Dr. Rory Percy - Neuro CHIEF COMPLAINT: Code Stroke  History of Present Illness:  56 year old woman who presented to [facility] 5/29 with reported left-sided weakness, slurred speech, nausea/vomiting. LKN 6 hours prior to arrival. PMHx significant for HTN, CHF (Echo 07/2021 EF 60-65%, severe LVH, G2DD), T2DM (c/b diabetic neuropathy with subsequent L great toe amputation 2/2 ulcer/infection), ESRD (on HD MWF via [facility] TDC).  On arrival to ED, patient is somnolent with slight disconjugate gaze with near flaccid left upper arm and left leg weakness. BP 222/107. CT Head with 5cc acute hematoma in the right thalamus with intraventricular extension. Neurology consulted and patient transferred to [facility] for further evaluation. PCCM consulted for admission.  On arrival to unit patient is intubated, pupils 71m bilaterally and non-reactive. Withdrawals in all extremities, does not follow commands, on Propofol and Cleviprex gtt.    Pertinent Medical History:  ESRD with HD MWF, CHF, HTN, DM, s/p left great toe amputation secondary to ulcer/infection   Significant Hospital Events: Including procedures, antibiotic start and stop dates in addition to other pertinent events   [facility], more awake, following commands with RUE/RLE 6/1 No issues overnight, she remains alert and interactive on vent.  Extubated 6/2 failed extubation requiring reintubation. Trached. 6/4 Placed on ATC yesterday afternoon and has tolerated well since 6/5: EVD raised to 20 cm H20 6/7: Failed EVD clamping trial after developing emesis and headache 6/8: Failed EVD clamping due to emesis 6/9: Increase EVD pressure to 25   Interim History / Subjective:  EVD back to 20. No recurrent vomiting. Planning for HD today.  Objective:  Blood  pressure 130/69, pulse 71, temperature 99.4 F (37.4 C), temperature source Axillary, resp. rate (!) 29, height '5\' 7"'$  (1.702 m), weight 97.6 kg, SpO2 95 %.    FiO2 (%):  [35 %-40 %] 40 %   Intake/Output Summary (Last 24 hours) at 09/06/2021 0745 Last data filed at 09/06/2021 0700 Gross per 24 hour  Intake 1680 ml  Output 521 ml  Net 1159 ml    Filed Weights   09/01/21 1718 09/03/21 0500 09/05/21 0500  Weight: 99.2 kg 97.8 kg 97.6 kg   Physical Examination:  General: Ill-appearing woman lying in bed in no acute distress HEENT: /AT, eyes anicteric Neck: Trach in place, very thick tan secretions Neuro: Awake, not tracking.  Strong cough, not responding to voice.  Some spontaneous movement on the right. CV: S1-S2, regular rate and rhythm PULM: Rhonchi bilaterally, thick tan secretions GI: Soft, nontender Extremities: Minimal lower extremity edema, no cyanosis Skin: Warm, dry, no diffuse rashes  CXR personally reviewed-evolving right lower lobe infiltrate.  Tracheostomy remains in place.  Assessment & Plan:   Intracranial hemorrhage- Large right thalamic bleed Acquired obstructive hydrocephalus due to thalamic bleed MRI shows stable bleed and ventricular size, small increase in surrounding edema compared to admission CT.  -Trach in place for airway protection, continue - Continue working with SLP, continue core track for now.  No PEG yet. -Repeat head CT today to determine if VP shunt will be required.  So far has been failing attempts at weaning EVD.  Hypertensive emergency -Continue losartan, amlodipine  ESRD on dialysis Mild hyperkalemia due to end-stage renal disease -MWF dialysis - Renally dose meds - Strict I's/O  Hyponatremia, likely due  to end-stage renal disease -Dialysis per nephrology  Chronic anemia due to ESRD - Transfuse for hemoglobin less than 7 or hemodynamically significant bleeding - Monitor  Hyperglycemia -Increase tube feeding coverage 6 units  every 4 hours - Increase sliding scale to regular coverage  Acute respiratory failure with hypoxia requiring vent, trach for prolonged vent weaning HAP RLL -Chest x-ray - Continue trach collar, no longer requiring mechanical ventilation - Trach aspirate culture -Will plan to start empiric zosyn, repeat MRSA nares to determine if MRSA coverage is required  Best Practice: (right click and "Reselect all SmartList Selections" daily)   Diet/type: tubefeeds DVT prophylaxis: Heparin tid GI prophylaxis: N/A Lines: N/A Foley:  N/A Code Status:  full code Last date of multidisciplinary goals of care discussion [Per primary]   Julian Hy, DO 09/06/21 9:35 AM [facility]

## 2021-09-06 NOTE — Progress Notes (Signed)
USGPIV for vasopressor: Arrived to unit, RN reports current site is patent with blood return. She will enter new IV Team consult if needed.   An existing USGPIV (ultrasound guided PIV) is in R forearm for short-term vasopressor infusion. A correctly placed ivWatch must be used when administering Vasopressors. Should this treatment be needed beyond 72 hours, central line access should be obtained.  It will be the responsibility of the bedside nurse to follow best practice to prevent extravasations.

## 2021-09-06 NOTE — TOC Progression Note (Signed)
Transition of Care [facility]) - Progression Note    Patient Details  Name: Janet Mitchell MRN: [id number] Date of Birth: Aug 03, 1965  Transition of [facility]) CM/SW [place], LCSW Phone Number: 09/06/2021, 11:39 AM  Clinical Narrative:    CSW continuing to follow for medical readiness and discharge needs alongside CIR.    Expected Discharge Plan: IP Rehab Facility Barriers to Discharge: Ship broker, Continued Medical Work up  Expected Discharge Plan and Services Expected Discharge Plan: [place] In-house Referral: Clinical Social Work     Living arrangements for the past 2 months: Single Family Home                                       Social Determinants of Health (SDOH) Interventions    Readmission Risk Interventions    07/19/2019    1:50 PM 07/12/2019    4:57 PM 04/05/2019    2:18 PM  Readmission Risk Prevention Plan  Transportation Screening Complete Complete Complete  Medication Review (RN Care Manager) Complete  Complete  PCP or Specialist appointment within 3-5 days of discharge Patient refused    [place] or [place] Complete    Palliative Care Screening Not Applicable  Not [place] Not Applicable Not Applicable Patient Refused

## 2021-09-06 NOTE — Progress Notes (Addendum)
STROKE TEAM PROGRESS NOTE   INTERVAL HISTORY Patient is awake and alert on trach collar. EVD in place, with output of 5-10cc/hr, 146cc over 24 hrs. She follows commands on the right, left is flaccid. Grimaces to pain. Repeat CT head this am No significant interval change in the right thalamic hemorrhage.  Later in am became hypotensive. CCM alerted. On levo ggt and vanc added.    Hd scheduled for today. Neurosurgery to decide on need for VP shunt   Vitals:   09/06/21 0600 09/06/21 0700 09/06/21 0800 09/06/21 0844  BP: (!) 114/56 130/69 (!) 121/48   Pulse: 69 71 96 73  Resp: (!) 23 (!) 29 (!) 24 (!) 25  Temp:   99.3 F (37.4 C)   TempSrc:   Axillary   SpO2: 94% 95% 95% 93%  Weight:      Height:       CBC:  Recent Labs  Lab 09/02/21 1119 09/05/21 0334 09/06/21 0549  WBC 10.6* 7.9 10.3  NEUTROABS 7.6  --   --   HGB 10.4* 10.0* 9.3*  HCT 32.8* 31.8* 29.0*  MCV 77.9* 77.4* 76.5*  PLT 128* 192 644    Basic Metabolic Panel:  Recent Labs  Lab 09/05/21 0334 09/05/21 0959 09/06/21 0549  NA 132*  --  132*  K 5.0  --  5.5*  CL 88*  --  87*  CO2 27  --  25  GLUCOSE 158*  --  148*  BUN 58*  --  85*  CREATININE 6.31*  --  8.11*  CALCIUM 9.7  --  10.2  PHOS  --  4.3 4.6      IMAGING past 24 hours DG CHEST PORT 1 VIEW  Result Date: 09/06/2021 CLINICAL DATA:  Fever and secretions. EXAM: PORTABLE CHEST 1 VIEW COMPARISON:  September 03 2021 FINDINGS: The heart size and mediastinal contours are stable. Tracheostomy tube, feeding tube are unchanged compared prior exam. Diffuse hazy opacity of bilateral lungs are identified stable. There is increased patchy consolidation of right lung base compared prior exam. There is no pleural effusion. The visualized skeletal structures are unremarkable. IMPRESSION: 1. Diffuse hazy opacity of bilateral lungs are identified stable. 2. Increased patchy consolidation of right lung base, suspicious for superimposed pneumonia. Electronically Signed   By:  Abelardo Diesel M.D.   On: 09/06/2021 08:42   CT HEAD WO CONTRAST (5MM)  Result Date: 09/06/2021 CLINICAL DATA:  Follow-up right thalamic hemorrhage. EXAM: CT HEAD WITHOUT CONTRAST TECHNIQUE: Contiguous axial images were obtained from the base of the skull through the vertex without intravenous contrast. RADIATION DOSE REDUCTION: This exam was performed according to the departmental dose-optimization program which includes automated exposure control, adjustment of the mA and/or kV according to patient size and/or use of iterative reconstruction technique. COMPARISON:  Head CT dated 09/03/2021. FINDINGS: Brain: No significant interval change in the right thalamic hemorrhage with associated mass effect and mild compression of the third ventricle. There is interval increase in the size of the lateral ventricles compared to the CT of 09/03/2021. The lateral ventricles however appears similar in size to the CT of 08/23/2021. Right frontal approach ventriculostomy shunt with tip in the left lateral ventricle. No new hemorrhage. No extra-axial fluid collection. Vascular: No hyperdense vessel or unexpected calcification. Skull: Right frontal burr hole and ventriculostomy. No acute calvarial pathology. Sinuses/Orbits: Diffuse mucoperiosteal thickening of paranasal sinuses with opacification of the sphenoid sinuses. No air-fluid. The mastoid air cells are clear. A nasogastric tube is partially visualized. Other:  None IMPRESSION: 1. No significant interval change in the right thalamic hemorrhage with associated mass effect and mild compression of the third ventricle. 2. Slight interval increase in the size of the lateral ventricles compared to CT of 09/03/2021 which may represent mild hydrocephalus. The lateral ventricles are however similar in size to the CT of 08/23/2021. Continued follow-up recommended. Right frontal approach ventriculostomy shunt with tip in the left lateral ventricle. Electronically Signed   By: Anner Crete M.D.   On: 09/06/2021 03:23    PHYSICAL EXAM  Physical Exam  Constitutional: Appears well-developed and well-nourished middle-age African-American lady.  Cardiovascular: Normal rate and regular rhythm.  Respiratory: Effort normal, non-labored breathing, on trach collar  Neuro -s/p tracheostomy without sedation, she is awake eyes open with mild left ptosis, able to follow simple commands.  Left gaze preference but able to overcome. PERRL. Gag and cough present. Face appears symmetrical.  Tongue protrusion midline.   Right side upper and lower extremity spontaneous movement, left upper and lower extremity hemiplegia  Sensation grimaces and withdraws to noxious stimuli coordination unable to test and gait not tested.   ASSESSMENT/PLAN [name] is a 56 y.o. female with history of ESRD on HD MWF, CHF, HTN, DM presenting with nausea, vomiting, left side weakness present upon waking. Intubated emergently at [facility] and transferred to [facility] on 5/59.  NSGY consulted, EVD placement 5/29. MRI shows stable size in right thalamic hemorrhage with edema extending to the pons and midbrain. EVD still in place. Extubated 08/26/2021. Reintubated 08/27/2021 and then tracheostomy placed. Consult for PEG tube sent to trauma team.   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 Small volume intraventricular extension of hemorrhage. Mild hydrocephalus. 5/29 Head CT 2200- decrease in size of lateral and third ventricles s/p EVD placement  Repeat CT head 6/12 -1. No significant interval change in the right thalamic hemorrhage with associated mass effect and mild compression of the third  ventricle. 2.  Slight interval increase in the size of the lateral ventricles compared to CT of 09/03/2021 which may represent mild hydrocephalus. CTA head & neck No arterial lesion underlying the right thalamic hematoma. Equivocal for spot sign which would be an adverse finding suggesting continued growth MRI  no change in size of the right thalamic intraparenchymal hemorrhage extending to midbrain, maximal dimension 3.6 cm by MRI. Surrounding edema, likely slightly more prominent. tricles are however similar in size to the CT of 08/23/2021. 2D Echo EF 60-65%, consistent with grade II diastolic dysfunction EEG - mild diffuse encephalopathy LDL 38 HgbA1c 6.2 VTE prophylaxis - SCDs aspirin 81 mg daily prior to admission, now on No antithrombotic.  Therapy recommendations:  CIR Disposition:  Pending  Obstructive Hydrocephalus NSGY on board 5/29- EVD placement  Follow up CT shows slightly decrease in size of lateral and third ventricle  EVD at 25 cm H2O Unable to tolerate clamping trial with nausea and vomiting  Cerebral edema On 3% saline @ 75- d/c'd Given ESRD, try to avoid fluid overload, will d/c 3% saline Last NA 132  Acute Hypoxic Respiratory failure Intubated 5/29 CCM managing vent Extubated 6/1 Reintubated and s/p trach 6/2  Hypertensive emergency-resolved Home meds:  labetolol, hydralazine, losartan Stable BP less than 160 On losartan 100, labetalol 200 3 times daily, hydralazine 50 3 times daily,  amlodipine 10 Long-term BP goal normotensive cleviprex off  End Stage Renal Failure on hemodialysis MWF Nephrology consulted Dialysis Scheduled for today  Monitor BMP/electrolytes  Diabetes Mellitus A1c 6.2, controlled CBGs SSI Close PCP follow-up  Other Stroke Risk Factors Obesity, Body mass index is 33.7 kg/m., BMI >/= 30 associated with increased stroke risk, recommend weight loss, diet and exercise as appropriate  Congestive heart failure  Other Active  Problems Hyperkalemia  K 6.1->4.2-> 3.5, s/p HD and Lokelma Dysphagia - cortrak placed on TF  Hypotensive: on levo. Concern for septis. Vanc added by CCM.  Hospital day # [place] DNP, ACNPC-AG   ATTENDING ATTESTATION:  56 year old with right thalamic intracranial hemorrhage and significant left hemiplegia.  She has EVD placed  at 20 and being followed by neurosurgery.  Plan is for possible VP shunt this week tentatively by neurosurgery.  She has hemodialysis today.  She dropped her blood pressure this morning and CCM was alerted.  Placed on levo drip going for long hours.  Broaden antibiotics to add vanc as there is concern for possible sepsis.  Afebrile overnight.  Discussed the patient's status with her husband over the phone.  He understands as she becomes hypotensive quite often even at home.  Plan discussed with Dr. Carlis Abbott and CCM team.     Dr. Reeves Forth evaluated pt independently, reviewed imaging, chart, labs. Discussed and formulated plan with the APP. Please see APP note above for details.     This patient is critically ill due to respiratory distress, stroke, hypotension concerning for sepsis and at significant risk of neurological worsening, death form heart failure, respiratory failure, recurrent stroke, bleeding from [facility], seizure, sepsis. This patient's care requires constant monitoring of vital signs, hemodynamics, respiratory and cardiac monitoring, review of multiple databases, neurological assessment, discussion with family, other specialists and medical decision making of high complexity. I spent 35 minutes of neurocritical care time in the care of this patient.   Dilon Lank,MD   To contact Stroke Continuity provider, please refer to [email or web address] After hours, contact General Neurology

## 2021-09-06 NOTE — Progress Notes (Addendum)
   09/06/21 1730  Vitals  Temp 99.9 F (37.7 C)  Temp Source Axillary  BP 112/67  MAP (mmHg) 81  Pulse Rate 79  ECG Heart Rate 79  Resp (!) 26  Oxygen Therapy  SpO2 (!) 87 %   RN to room for O2 desat; performed suctioning and inner canula care; O2 dropped to 80%. RT at bedside. CCM called to room. Orders to place back on vent. Husband updated in room.

## 2021-09-06 NOTE — TOC Initial Note (Signed)
Transition of Care [facility]) - Initial/Assessment Note    Patient Details  Name: Janet Mitchell MRN: [id number] Date of Birth: 06-16-1965  [facility]) CM/SW Contact:    Benard Halsted, LCSW Phone Number: 09/06/2021, 11:38 AM  Clinical Narrative:                 CSW following for discharge planning needs. Patient not currently medically ready.   Expected Discharge Plan: IP Rehab Facility Barriers to Discharge: Ship broker, Continued Medical Work up   Patient Goals and CMS Choice Patient states their goals for this hospitalization and ongoing recovery are:: Rehab      Expected Discharge Plan and Services Expected Discharge Plan: [place] In-house Referral: Clinical Social Work     Living arrangements for the past 2 months: [place]                                      Prior Living Arrangements/Services Living arrangements for the past 2 months: Single Family Home Lives with:: Spouse Patient language and need for interpreter reviewed:: Yes Do you feel safe going back to the place where you live?: Yes      Need for Family Participation in Patient Care: Yes (Comment) Care giver support system in place?: Yes (comment)   Criminal Activity/Legal Involvement Pertinent to Current Situation/Hospitalization: No - Comment as needed  Activities of Daily Living      Permission Sought/Granted Permission sought to share information with : Facility Sport and exercise psychologist, Family Supports Permission granted to share information with : No              Emotional Assessment Appearance:: Appears stated age Attitude/Demeanor/Rapport: Unable to Assess Affect (typically observed): Unable to Assess Orientation: :  (Trach/follows commands) Alcohol / Substance Use: Not Applicable Psych Involvement: No (comment)  Admission diagnosis:  Intracranial hemorrhage ([place]) [I62.9] ICH (intracerebral hemorrhage) ([place]) [I61.9] Hypertensive  emergency [I16.1] Patient Active Problem List   Diagnosis Date Noted   Acquired obstructive hydrocephalus ([place]) 08/24/2021   Acute respiratory failure ([place])    Intracranial hemorrhage ([place]) 08/23/2021   Proliferative diabetic retinopathy of right eye determined by examination ([place]) 06/29/2021   Stable treated proliferative diabetic retinopathy of left eye determined by examination associated with type 2 diabetes mellitus ([place]) 06/29/2021   URI (upper respiratory infection) 09/23/2020   ESRD (end stage renal disease) on dialysis ([place])    Class 2 severe obesity due to excess calories with serious comorbidity and body mass index (BMI) of 37.0 to 37.9 in adult [facility]) 02/14/2019   Abnormal nuclear stress test 11/02/2018   Rectal bleeding 08/27/2018   Diarrhea 05/22/2018   Microcytic anemia 03/25/2018   Chronic cholecystitis with calculus 03/09/2018   Type 2 diabetes mellitus with ESRD (end-stage renal disease) ([place]) 03/09/2018   Pre-transplant evaluation for ESRD (end stage renal disease) 12/19/2017   Dependence on renal dialysis ([place]) 11/06/2017   Diabetic retinopathy ([place]) 01/23/2016   End stage renal disease on dialysis due to type 2 diabetes mellitus ([place]) 01/23/2016   Hypertensive emergency 01/23/2016   Renal osteodystrophy 01/23/2016   Diabetic macular edema ([place]) 04/10/2015   Edema of lower extremity 04/10/2015   Paresthesia of lower extremity 04/10/2015   Recurrent falls 04/10/2015   PCP:  Lindell Spar, MD Pharmacy:   [facility], [address] [place] [address] Phone:  [phone] Fax: [phone]  [facility] [address] [place] [address] Phone: [phone] Fax: [phone]     Social Determinants of Health (SDOH) Interventions    Readmission Risk Interventions    07/19/2019    1:50 PM 07/12/2019    4:57 PM 04/05/2019    2:18 PM  Readmission Risk Prevention Plan   Transportation Screening Complete Complete Complete  Medication Review ([place]) Complete  Complete  PCP or Specialist appointment within 3-5 days of discharge Patient refused    [place] or [place] Complete    Palliative Care Screening Not Applicable  Not [place] Not Applicable Not Applicable Patient Refused

## 2021-09-06 NOTE — Progress Notes (Signed)
Now hypotensive. Stopping antihypertensives, peripheral NE ordered. Broaden antibiotics to include vanc empirically, still needs MRSA swab. Check LA & blood cultures.  Julian Hy, DO 09/06/21 10:13 AM [facility]

## 2021-09-06 NOTE — Progress Notes (Signed)
Patient ID: Janet Mitchell, female   DOB: November 20, 1965, 56 y.o.   MRN: [id number] [facility] Progress Note    Subjective:   Without acute clinical events noted overnight   Objective:   BP (!) 95/46   Pulse 75   Temp 100 F (37.8 C) (Axillary)   Resp (!) 27   Ht '5\' 7"'$  (1.702 m)   Wt 97.6 kg   SpO2 97%   BMI 33.70 kg/m   Physical Exam: Gen: Somnolent, does not arouse to calling her name and spontaneously moving right arm CVS: Pulse regular rhythm, normal rate, S1 and S2 normal Resp: Status post tracheostomy with coarse/transmitted breath sounds bilaterally over anterior chest  Abd: Soft, obese, nontender, bowel sounds normal Ext: Trace lower extremity edema with protective heel dressings, left upper arm AV fistula with intact dressings and pulsatile  Home meds include - asa, tylenol, renavite, cadexomer iodine, phoslo, ceterizine, cholestyramine, hydralazine 10 bid, labetalol 200 bid, losartan 100, psyllium     OP HD: [facility] MWF  4h  97.5kg  450/500  2/2.5 bath  15ga  AVF  Hep none - mircera 100 ug q2 - rocaltrol 1.0 mcg po tiw     Assessment/ Plan:   Right thalamic intracranial hemorrhage: With intraventricular hemorrhage and hydrocephalus and now status post ventriculostomy drain placement by neurosurgery.  Ongoing efforts noted for ventriculostomy weaning.  ESRD: is on MWF HD schedule. Had extra HD last week for vol overload. Plan is for HD today.  Volume/ HTN: down to dry wt after 5 HD sessions last week. Resp status better. BP's are normal and all po /ng meds are currently dc'd / on hold.  Anemia: Hemoglobin and hematocrit currently acceptable, continue to follow trend to decide on need to restart ESA. 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 Nutrition: On tube feeds with ongoing nutritional supplementation.  Hypertension: Blood pressure currently at acceptable  range, continue to monitor on current antihypertensive therapy and hemodialysis. Resp failure/ sp trach - better, off vent, on trach collar Atrial fib: a/c on hold, off BB, in NSR    [employer], MD 09/06/2021, 2:48 PM  Recent Labs  Lab 09/05/21 0334 09/05/21 0959 09/06/21 0549  HGB 10.0*  --  9.3*  ALBUMIN  --   --  2.9*  CALCIUM 9.7  --  10.2  PHOS  --  4.3 4.6  CREATININE 6.31*  --  8.11*  K 5.0  --  5.5*   Inpatient medications:  amantadine  200 mg Per Tube Weekly   calcitRIOL  1 mcg Per Tube Q M,W,F   chlorhexidine  15 mL Mouth Rinse BID   Chlorhexidine Gluconate Cloth  6 each Topical Q0600   fiber  1 packet Per Tube BID   heparin injection (subcutaneous)  5,000 Units Subcutaneous Q8H   insulin aspart  0-9 Units Subcutaneous Q4H   insulin aspart  6 Units Subcutaneous Q4H   mouth rinse  15 mL Mouth Rinse q12n4p   multivitamin  1 tablet Per Tube QHS    sodium chloride Stopped (09/06/21 1141)   feeding supplement (VITAL AF 1.2 CAL) 70 mL/hr at 09/06/21 1400   norepinephrine (LEVOPHED) Adult infusion Stopped (09/06/21 1138)   acetaminophen **OR** acetaminophen (TYLENOL) oral liquid 160 mg/5 mL **OR** acetaminophen, diphenhydrAMINE, fentaNYL (SUBLIMAZE) injection, Gerhardt's butt cream, ipratropium-albuterol, labetalol, ondansetron (ZOFRAN) IV, polyvinyl alcohol, senna-docusate

## 2021-09-06 NOTE — TOC Progression Note (Signed)
Transition of Care [facility]) - Progression Note    Patient Details  Name: Janet Mitchell MRN: [id number] Date of Birth: [birth year]  Transition of [facility]) CM/SW [place], LCSW Phone Number: 09/06/2021, 11:38 AM  Clinical Narrative:    CSW following for discharge planning needs. Not currently able to participate in CIR.    Expected Discharge Plan: IP Rehab Facility Barriers to Discharge: Ship broker, Continued Medical Work up  Expected Discharge Plan and Services Expected Discharge Plan: [place] In-house Referral: Clinical Social Work     Living arrangements for the past 2 months: Single Family Home                                       Social Determinants of Health (SDOH) Interventions    Readmission Risk Interventions    07/19/2019    1:50 PM 07/12/2019    4:57 PM 04/05/2019    2:18 PM  Readmission Risk Prevention Plan  Transportation Screening Complete Complete Complete  Medication Review (RN Care Manager) Complete  Complete  PCP or Specialist appointment within 3-5 days of discharge Patient refused    [place] or [place] Complete    Palliative Care Screening Not Applicable  Not [place] Not Applicable Not Applicable Patient Refused

## 2021-09-06 NOTE — Progress Notes (Signed)
Nutrition Follow-up  DOCUMENTATION CODES:   Obesity unspecified  INTERVENTION:   Tube feeding via cortrak tube: Vital AF 1.2 at 70 ml/h (1680 ml per day)  Provides 2016 kcal, 126 gm protein, 1362 ml free water daily   NUTRITION DIAGNOSIS:   Inadequate oral intake related to inability to eat (pt sedated and ventilated) as evidenced by NPO status. Ongoing.   GOAL:   Provide needs based on ASPEN/SCCM guidelines Met with TF at goal.   MONITOR:   TF tolerance  REASON FOR ASSESSMENT:   Ventilator    ASSESSMENT:   56 y/o female with h/o ESRD on HD, HTN, DM, COVID 19 (03/2019) and CHF who is admitted with right thalamus ICH with IVH and obstructive hydrocephalus s/p right frontal ventriculostomy with EVD placement 5/29.  Pt discussed during ICU rounds and with RN and MD. Levo started for hypotension, chest xray suspicious for PNA per reading. Per MD cultures pending.  Remains on trach collar.   5/29 - s/p EVD 6/2 - s/p trach and cortrak placement   Medications reviewed and include: nutrisource fiber, SSI, novolog, rena-vit Levo @ 2 mcg  Labs reviewed: Na 132, PO 5.5 CBG's: 182-214  EVD: 146 ml    Diet Order:   Diet Order             Diet NPO time specified  Diet effective now                   EDUCATION NEEDS:   No education needs have been identified at this time  Skin:  Skin Assessment:  (eschar L great toe amputation site)  Last BM:  250 ml via rectal tube  Height:   Ht Readings from Last 1 Encounters:  08/23/21 5' 7" (1.702 m)    Weight:   Wt Readings from Last 1 Encounters:  09/05/21 97.6 kg    Ideal Body Weight:  61.3 kg  BMI:  Body mass index is 33.7 kg/m.  Estimated Nutritional Needs:   Kcal:  1900-2200kcal/day  Protein:  >125g/day  Fluid:  1.9-2.2L/day  Lockie Pares., RD, LDN, CNSC See AMiON for contact information

## 2021-09-06 NOTE — Progress Notes (Signed)
RT called to patient room due to desating. Upon arrival RT noticed sats in 70's on ATC. Patient coughing up copious secretions. RT ATC increased to 98% and attempted suctioning with sats only improving to mid 80's. RT placed patient on ventilator per CCM, with NP and RN at bedside- with sats improving to 100%. Patient tolerating well. RT will continue to monitor.

## 2021-09-06 NOTE — Progress Notes (Signed)
Pharmacy Antibiotic Note  Janet Mitchell is a 56 y.o. female admitted on 08/23/2021 with HCAP. Pharmacy has been consulted for vancomycin and Zosyn dosing.  Patient is on MWF hemodialysis.   Plan: Vancomycin 2 g IV x1 followed by 1000 mg IV post-HD on dialysis days Zosyn 3.375 g IV x1 followed by 2.25 g IV q8h  F/u fever curve, clinical course, and culture results  Height: '5\' 7"'$  (170.2 cm) Weight: 97.6 kg (215 lb 2.7 oz) IBW/kg (Calculated) : 61.6  Temp (24hrs), Avg:99.8 F (37.7 C), Min:99.3 F (37.4 C), Max:100.3 F (37.9 C)  Recent Labs  Lab 09/01/21 1656 09/02/21 1119 09/05/21 0334 09/06/21 0549 09/06/21 1021  WBC 8.1 10.6* 7.9 10.3  --   CREATININE 5.23* 4.62* 6.31* 8.11*  --   LATICACIDVEN  --   --   --   --  1.5    Estimated Creatinine Clearance: 9.6 mL/min (A) (by C-G formula based on SCr of 8.11 mg/dL (H)).    No Known Allergies  Antimicrobials this admission: Vanc 6/12 >> Zosyn 5/29 >> 6/3; 6/12 >> PTA cipro  Dose adjustments this admission: N/A  Microbiology results: 5/29 MRSA: neg 6/12 Resp Cx: IP 6/12 BCx: IP 6/12 MRSA: neg  Thank you for allowing pharmacy to be a part of this patient's care.  Zenaida Deed, PharmD PGY1 Acute Care Pharmacy Resident  Phone: [phone] 09/06/2021  2:14 PM  Please check [email or web address] for unit-specific pharmacy phone numbers.

## 2021-09-06 NOTE — Progress Notes (Signed)
Neurosurgery  Patient has had moderate output (170 ml) at 20 cm H2O, and CT head shows some aqueductal compression from her thalamic/midbrain hemorrhage. As it likely will be weeks before the hemorrhage resolves and her aqueduct opens up, she likely will need a VP shunt which will be scheduled for Wednesday tentatively, barring any fevers or active infectious issues.  We may try one last clamp trial tomorrow, though patient has not tolerated previous attempts due to nausea.

## 2021-09-07 DIAGNOSIS — Z9911 Dependence on respirator [ventilator] status: Secondary | ICD-10-CM

## 2021-09-07 DIAGNOSIS — E871 Hypo-osmolality and hyponatremia: Secondary | ICD-10-CM

## 2021-09-07 DIAGNOSIS — J95851 Ventilator associated pneumonia: Secondary | ICD-10-CM | POA: Diagnosis not present

## 2021-09-07 DIAGNOSIS — J9601 Acute respiratory failure with hypoxia: Secondary | ICD-10-CM | POA: Diagnosis not present

## 2021-09-07 DIAGNOSIS — I629 Nontraumatic intracranial hemorrhage, unspecified: Secondary | ICD-10-CM | POA: Diagnosis not present

## 2021-09-07 LAB — CBC
HCT: 28.1 % — ABNORMAL LOW (ref 36.0–46.0)
HCT: 27.7 % — ABNORMAL LOW (ref 36.0–46.0)
Hemoglobin: 9.5 g/dL — ABNORMAL LOW (ref 12.0–15.0)
Hemoglobin: 9.4 g/dL — ABNORMAL LOW (ref 12.0–15.0)
MCH: 24.9 pg — ABNORMAL LOW (ref 26.0–34.0)
MCH: 25.7 pg — ABNORMAL LOW (ref 26.0–34.0)
MCHC: 33.8 g/dL (ref 30.0–36.0)
MCHC: 33.9 g/dL (ref 30.0–36.0)
MCV: 73.8 fL — ABNORMAL LOW (ref 80.0–100.0)
MCV: 75.7 fL — ABNORMAL LOW (ref 80.0–100.0)
Platelets: 214 10*3/uL (ref 150–400)
Platelets: 197 10*3/uL (ref 150–400)
RBC: 3.81 MIL/uL — ABNORMAL LOW (ref 3.87–5.11)
RBC: 3.66 MIL/uL — ABNORMAL LOW (ref 3.87–5.11)
RDW: 15.8 % — ABNORMAL HIGH (ref 11.5–15.5)
RDW: 15.9 % — ABNORMAL HIGH (ref 11.5–15.5)
WBC: 9.5 10*3/uL (ref 4.0–10.5)
WBC: 10.6 10*3/uL — ABNORMAL HIGH (ref 4.0–10.5)
nRBC: 0 % (ref 0.0–0.2)
nRBC: 0 % (ref 0.0–0.2)

## 2021-09-07 LAB — RENAL FUNCTION PANEL
Albumin: 2.8 g/dL — ABNORMAL LOW (ref 3.5–5.0)
Anion gap: 20 — ABNORMAL HIGH (ref 5–15)
BUN: 65 mg/dL — ABNORMAL HIGH (ref 6–20)
CO2: 24 mmol/L (ref 22–32)
Calcium: 9.9 mg/dL (ref 8.9–10.3)
Chloride: 87 mmol/L — ABNORMAL LOW (ref 98–111)
Creatinine, Ser: 5.95 mg/dL — ABNORMAL HIGH (ref 0.44–1.00)
GFR, Estimated: 8 mL/min — ABNORMAL LOW (ref >60)
Glucose, Bld: 110 mg/dL — ABNORMAL HIGH (ref 70–99)
Phosphorus: 2.8 mg/dL (ref 2.5–4.6)
Potassium: 4.4 mmol/L (ref 3.5–5.1)
Sodium: 131 mmol/L — ABNORMAL LOW (ref 135–145)

## 2021-09-07 LAB — GLUCOSE, CAPILLARY
Glucose-Capillary: 143 mg/dL — ABNORMAL HIGH (ref 70–99)
Glucose-Capillary: 146 mg/dL — ABNORMAL HIGH (ref 70–99)
Glucose-Capillary: 155 mg/dL — ABNORMAL HIGH (ref 70–99)
Glucose-Capillary: 157 mg/dL — ABNORMAL HIGH (ref 70–99)
Glucose-Capillary: 161 mg/dL — ABNORMAL HIGH (ref 70–99)
Glucose-Capillary: 165 mg/dL — ABNORMAL HIGH (ref 70–99)

## 2021-09-07 LAB — MAGNESIUM: Magnesium: 2 mg/dL (ref 1.7–2.4)

## 2021-09-07 MED ORDER — CHLORHEXIDINE GLUCONATE CLOTH 2 % EX PADS
6.0000 | MEDICATED_PAD | Freq: Every day | CUTANEOUS | Status: DC
  Administered 2021-09-08 – 2021-09-09 (×2): 6 via TOPICAL

## 2021-09-07 NOTE — Progress Notes (Signed)
Inpatient Rehab Admissions Coordinator:   CIR following at a distance, not medically ready. Likely to have VP shunt tomorrow. I will continue to follow for potential admit if and when Pt. Is medically ready and demonstrates tolerance.   Clemens Catholic, [place], [place] Admissions Coordinator  [phone] ([place]) [phone] (office)

## 2021-09-07 NOTE — Progress Notes (Signed)
NAME:  Janet Mitchell, MRN:  [id number], DOB:  05-01-1965, LOS: 15 ADMISSION DATE:  08/23/2021, CONSULTATION DATE:  08/23/2021 REFERRING MD:  Dr. Rory Percy - Neuro CHIEF COMPLAINT: Code Stroke  History of Present Illness:  56 year old woman who presented to [facility] 5/29 with reported left-sided weakness, slurred speech, nausea/vomiting. LKN 6 hours prior to arrival. PMHx significant for HTN, CHF (Echo 07/2021 EF 60-65%, severe LVH, G2DD), T2DM (c/b diabetic neuropathy with subsequent L great toe amputation 2/2 ulcer/infection), ESRD (on HD MWF via [facility]).  On arrival to ED, patient is somnolent with slight disconjugate gaze with near flaccid left upper arm and left leg weakness. BP 222/107. CT Head with 5cc acute hematoma in the right thalamus with intraventricular extension. Neurology consulted and patient transferred to [facility] for further evaluation. PCCM consulted for admission.  On arrival to unit patient is intubated, pupils 66m bilaterally and non-reactive. Withdrawals in all extremities, does not follow commands, on Propofol and Cleviprex gtt.    Pertinent Medical History:  ESRD with HD MWF, CHF, HTN, DM, s/p left great toe amputation secondary to ulcer/infection   Significant Hospital Events: Including procedures, antibiotic start and stop dates in addition to other pertinent events   [facility]28 [facility]. [facility] HD, more awake, following commands with RUE/RLE 6/1 No issues overnight, she remains alert and interactive on vent.  Extubated 6/2 failed extubation requiring reintubation. Trached. 6/4 Placed on ATC yesterday afternoon and has tolerated well since 6/5: EVD raised to 20 cm H20 6/7: Failed EVD clamping trial after developing emesis and headache 6/8: Failed EVD clamping due to emesis 6/9: Increase EVD pressure to 25 6/12 back on vent due to hypoxia from mucus plugging   Interim History / Subjective:  Yesterday had to go on  pressors briefly and back on the vent due to mucus plugging. Off pressors this morning. No acute events overnight. HD this morning.  Objective:  Blood pressure 113/62, pulse 76, temperature 99 F (37.2 C), temperature source Oral, resp. rate (!) 21, height '5\' 7"'$  (1.702 m), weight 97.6 kg, SpO2 98 %.    Vent Mode: PRVC FiO2 (%):  [40 %-100 %] 40 % Set Rate:  [16 bmp] 16 bmp Vt Set:  [490 mL] 490 mL PEEP:  [5 cmH20] 5 cmH20 Plateau Pressure:  [15 cmH20-18 cmH20] 18 cmH20   Intake/Output Summary (Last 24 hours) at 09/07/2021 0711 Last data filed at 09/07/2021 0600 Gross per 24 hour  Intake 1316.61 ml  Output 335 ml  Net 981.61 ml    Filed Weights   09/01/21 1718 09/03/21 0500 09/05/21 0500  Weight: 99.2 kg 97.8 kg 97.6 kg   Physical Examination:  General: critically ill appearing woman lying in bed in NAD HEENT: EVD without erythema Neck: trach in place Neuro: sleeping, follows commands and nods to answer questions without opening her eyes. Follows commands on the R only, hemiparetic on the left. CV:  S1S2, RRR PULM: rhonchi improved on MV, synchronous with vent. GI: soft, NT Extremities: no cyanosis or edema Skin: warm, dry, no rashes  Na+  131 K+ 4.4 BUN 65 Cr 5.95 WBC 9.5 H/H 9.5/28.1 Platelets 214 Resp culture> no WBC, rare GN diplococci, rare GPC, rare GNR Blood culture> NG BG 110-210s  Assessment & Plan:   Intracranial hemorrhage- Large right thalamic bleed Acquired obstructive hydrocephalus due to thalamic bleed MRI shows stable bleed and ventricular size, small increase in surrounding edema compared to admission CT.  -  con't supportive care, still needs trach -Con't cortrak for nutrition and working with SLP -Has been failing attempts at weaning EVD; appreicate NS management. Weaning trial again today, but tentatively planning for VP shunt Wednesday.   Hypertensive emergency, resolved -off antihypertensives, which mostly had been held previously  ESRD on  dialysis Mild hyperkalemia due to end-stage renal disease -MWF dialysis; HD this morning -renally dose meds --strict I/O   Hyponatremia, likely due to end-stage renal disease -HD per nephro  Chronic anemia due to ESRD - transfuse for HB <7 or hemodynamically significant bleeding  Hyperglycemia, better controlled --Con't  tube feeding coverage 6 units every 4 hours. -avoid long-acting insulin with plans for OR tomorrow - Goal BG 140-180 --SSI PRN  Acute respiratory failure with hypoxia requiring vent, trach for prolonged vent weaning HAP RLL; concern for septic shock-- shock resolved - LTVV; can try weaning vent after. Previously on TCT from since 6/3. - Trach aspirate culture -Con't empiric zosyn  Best Practice: (right click and "Reselect all SmartList Selections" daily)   Diet/type: tubefeeds DVT prophylaxis: Heparin tid GI prophylaxis: N/A Lines: N/A Foley:  N/A Code Status:  full code Last date of multidisciplinary goals of care discussion [Per primary]  This patient is critically ill with multiple organ system failure which requires frequent high complexity decision making, assessment, support, evaluation, and titration of therapies. This was completed through the application of advanced monitoring technologies and extensive interpretation of multiple databases. During this encounter critical care time was devoted to patient care services described in this note for 45 minutes.    Julian Hy, DO 09/07/21 7:57 AM [facility]

## 2021-09-07 NOTE — Progress Notes (Signed)
Patient ID: Janet Mitchell, female   DOB: 1966-01-11, 56 y.o.   MRN: [id number] [facility] Progress Note    Subjective:   Without acute clinical events noted overnight   Objective:   BP 130/69   Pulse 76   Temp 98.6 F (37 C) (Oral)   Resp 14   Ht '5\' 7"'$  (1.702 m)   Wt 97.6 kg   SpO2 99%   BMI 33.70 kg/m   Physical Exam: Gen: Somnolent, does not arouse to calling her name and spontaneously moving right arm CVS: Pulse regular rhythm, normal rate, S1 and S2 normal Resp: Status post tracheostomy with coarse/transmitted breath sounds bilaterally over anterior chest  Abd: Soft, obese, nontender, bowel sounds normal Ext: Trace lower extremity edema with protective heel dressings, left upper arm AV fistula with intact dressings and pulsatile  Home meds include - asa, tylenol, renavite, cadexomer iodine, phoslo, ceterizine, cholestyramine, hydralazine 10 bid, labetalol 200 bid, losartan 100, psyllium     OP HD: [facility] MWF  4h  97.5kg  450/500  2/2.5 bath  15ga  AVF  Hep none - mircera 100 ug q2 - rocaltrol 1.0 mcg po tiw     Assessment/ Plan:   Right thalamic intracranial hemorrhage: With intraventricular hemorrhage and hydrocephalus and ventriculostomy drain placement by neurosurgery.  Ongoing efforts noted for ventriculostomy weaning.  ESRD: is on MWF HD schedule. Had extra HD last week for vol overload. Next HD tomorrow.  Volume/ HTN: down to dry wt after extra HD last week. Prob has lost body wt, will cont UF 2 L q HD as tolerated for now. BP's wnl off all IV and po meds.   Anemia: Hb 9-10 range, continue to follow CKD-MBD: On calcitriol for PTH suppression, currently not on binder while getting tube feeds; check phosphorus level today. Nutrition: On tube feeds with ongoing nutritional supplementation.  Hypertension: Blood pressure currently at acceptable range, continue to monitor on current antihypertensive therapy and hemodialysis. Resp failure/  sp trach - off vent, on trach collar Atrial fib: a/c on hold, off BB, in NSR    Rob Jonnie Finner, MD 09/07/2021, 4:03 PM  Recent Labs  Lab 09/06/21 0549 09/07/21 0545  HGB 9.3* 9.5*  ALBUMIN 2.9* 2.8*  CALCIUM 10.2 9.9  PHOS 4.6 2.8  CREATININE 8.11* 5.95*  K 5.5* 4.4    Inpatient medications:  amantadine  200 mg Per Tube Weekly   calcitRIOL  1 mcg Per Tube Q M,W,F   chlorhexidine  15 mL Mouth Rinse BID   Chlorhexidine Gluconate Cloth  6 each Topical Q0600   fiber  1 packet Per Tube BID   heparin injection (subcutaneous)  5,000 Units Subcutaneous Q8H   insulin aspart  0-9 Units Subcutaneous Q4H   insulin aspart  6 Units Subcutaneous Q4H   mouth rinse  15 mL Mouth Rinse q12n4p   multivitamin  1 tablet Per Tube QHS   pantoprazole sodium  40 mg Per Tube Daily    sodium chloride Stopped (09/06/21 1141)   feeding supplement (VITAL AF 1.2 CAL) 70 mL/hr at 09/07/21 1400   piperacillin-tazobactam (ZOSYN)  IV 2.25 g (09/07/21 1450)   acetaminophen **OR** acetaminophen (TYLENOL) oral liquid 160 mg/5 mL **OR** acetaminophen, diphenhydrAMINE, fentaNYL (SUBLIMAZE) injection, Gerhardt's butt cream, ipratropium-albuterol, labetalol, ondansetron (ZOFRAN) IV, polyvinyl alcohol, senna-docusate

## 2021-09-07 NOTE — Progress Notes (Addendum)
Subjective: NAEs o/n  Objective: Vital signs in last 24 hours: Temp:  [99 F (37.2 C)-100.6 F (38.1 C)] 99 F (37.2 C) (06/13 0415) Pulse Rate:  [71-79] 77 (06/13 0700) Resp:  [8-42] 25 (06/13 0700) BP: (89-162)/(41-112) 130/71 (06/13 0700) SpO2:  [87 %-100 %] 98 % (06/13 0700) FiO2 (%):  [40 %-100 %] 40 % (06/13 0312)  Intake/Output from previous day: 06/12 0701 - 06/13 0700 In: 1316.6 [I.V.:26.5; NG/GT:840; IV Piggyback:450.1] Out: 335 [Drains:185; Stool:150] Intake/Output this shift: Total I/O In: -  Out: 16 [Drains:16]  Trach'd Undergoing dialysis No movement on left side  Lab Results: Recent Labs    09/06/21 0549 09/07/21 0545  WBC 10.3 9.5  HGB 9.3* 9.5*  HCT 29.0* 28.1*  PLT 213 214   BMET Recent Labs    09/06/21 0549 09/07/21 0545  NA 132* 131*  K 5.5* 4.4  CL 87* 87*  CO2 25 24  GLUCOSE 148* 110*  BUN 85* 65*  CREATININE 8.11* 5.95*  CALCIUM 10.2 9.9    Studies/Results: DG CHEST PORT 1 VIEW  Result Date: 09/06/2021 CLINICAL DATA:  Fever and secretions. EXAM: PORTABLE CHEST 1 VIEW COMPARISON:  September 03 2021 FINDINGS: The heart size and mediastinal contours are stable. Tracheostomy tube, feeding tube are unchanged compared prior exam. Diffuse hazy opacity of bilateral lungs are identified stable. There is increased patchy consolidation of right lung base compared prior exam. There is no pleural effusion. The visualized skeletal structures are unremarkable. IMPRESSION: 1. Diffuse hazy opacity of bilateral lungs are identified stable. 2. Increased patchy consolidation of right lung base, suspicious for superimposed pneumonia. Electronically Signed   By: Abelardo Diesel M.D.   On: 09/06/2021 08:42   CT HEAD WO CONTRAST (5MM)  Result Date: 09/06/2021 CLINICAL DATA:  Follow-up right thalamic hemorrhage. EXAM: CT HEAD WITHOUT CONTRAST TECHNIQUE: Contiguous axial images were obtained from the base of the skull through the vertex without intravenous contrast.  RADIATION DOSE REDUCTION: This exam was performed according to the departmental dose-optimization program which includes automated exposure control, adjustment of the mA and/or kV according to patient size and/or use of iterative reconstruction technique. COMPARISON:  Head CT dated 09/03/2021. FINDINGS: Brain: No significant interval change in the right thalamic hemorrhage with associated mass effect and mild compression of the third ventricle. There is interval increase in the size of the lateral ventricles compared to the CT of 09/03/2021. The lateral ventricles however appears similar in size to the CT of 08/23/2021. Right frontal approach ventriculostomy shunt with tip in the left lateral ventricle. No new hemorrhage. No extra-axial fluid collection. Vascular: No hyperdense vessel or unexpected calcification. Skull: Right frontal burr hole and ventriculostomy. No acute calvarial pathology. Sinuses/Orbits: Diffuse mucoperiosteal thickening of paranasal sinuses with opacification of the sphenoid sinuses. No air-fluid. The mastoid air cells are clear. A nasogastric tube is partially visualized. Other: None IMPRESSION: 1. No significant interval change in the right thalamic hemorrhage with associated mass effect and mild compression of the third ventricle. 2. Slight interval increase in the size of the lateral ventricles compared to CT of 09/03/2021 which may represent mild hydrocephalus. The lateral ventricles are however similar in size to the CT of 08/23/2021. Continued follow-up recommended. Right frontal approach ventriculostomy shunt with tip in the left lateral ventricle. Electronically Signed   By: Anner Crete M.D.   On: 09/06/2021 03:23    Assessment/Plan: Thalamic/midbrain ICH - likely VP shunt tomorrow - cont EVD at 20 cm - via phone, I discussed with patient's  husband Joneen Boers the general technique of the procedure as well as risks, benefits, alternatives, and expected convalescence.  Risks  discussed included, but were not limited to, bleeding, pain, infection, neurologic deficit, seizure, scar, stroke, damage to nearby organs, malfunction, death.  He wished to proceed, informed consent was obtained.  Vallarie Mare 09/07/2021, 8:07 AM

## 2021-09-07 NOTE — Progress Notes (Signed)
Physical Therapy Treatment Patient Details Name: Janet Mitchell MRN: [id number] DOB: November 12, 1965 Today's Date: 09/07/2021   History of Present Illness 56 y.o. female presents to [facility] as a transfer from [facility] on 08/23/2021 with complaints of nausea, vomiting, L weakness. Pt required emergent intubation during transfer from [facility]. CTH reveals R thalamic hemorrhage with IVH. Pt underwent R frontal ventriculostomy on 5/29. Extubated 6/1, reintubated 6/2, tracheostomy 6/2. Pt with mucus plugging and placed back on vent 6/12. PMH includes HTN, CHF, MDII, ESRD.    PT Comments    Pt back on ventilator. Worked on bed level exercises with pt following some commands with rt extremities. Pt with tight heel cords bilaterally R>L. Continue to recommend SNF although may also be appropriate for LTACH if respiratory status continues to be a barrier.    Recommendations for follow up therapy are one component of a multi-disciplinary discharge planning process, led by the attending physician.  Recommendations may be updated based on patient status, additional functional criteria and insurance authorization.  Follow Up Recommendations  Skilled nursing-short term rehab (<3 hours/day)     Assistance Recommended at Discharge Frequent or constant Supervision/Assistance  Patient can return home with the following Two people to help with walking and/or transfers;Two people to help with bathing/dressing/bathroom;Assistance with cooking/housework;Assistance with feeding;Direct supervision/assist for medications management;Direct supervision/assist for financial management;Help with stairs or ramp for entrance;Assist for transportation   Equipment Recommendations  Hospital bed;Other (comment);Wheelchair (measurements PT);Wheelchair cushion (measurements PT) (hoyer lift)    Recommendations for Other Services       Precautions / Restrictions Precautions Precautions: Fall;Other (comment) Precaution  Comments: trach, EVD, cortrack, flexiseal Restrictions Weight Bearing Restrictions: No     Mobility  Bed Mobility                    Transfers                        Ambulation/Gait                   Stairs             Wheelchair Mobility    Modified Rankin (Stroke Patients Only)       Balance                                            Cognition Arousal/Alertness: Awake/alert Behavior During Therapy: Flat affect Overall Cognitive Status: Difficult to assess                                 General Comments: Pt awake. Followed one step commands intermittently        Exercises Low Level/ICU Exercises Ankle Circles/Pumps: PROM, Both, 10 reps, Supine Hip ABduction/ADduction: AAROM, Right, 10 reps, PROM, Left, Supine Heel Slides: AAROM, Right, 10 reps, PROM, Left Shoulder Flexion: AAROM, Right, 10 reps, Supine Elbow Flexion: AAROM, Right, 10 reps, PROM, Left    General Comments        Pertinent Vitals/Pain Pain Assessment Pain Assessment: CPOT Facial Expression: Relaxed, neutral Body Movements: Absence of movements Muscle Tension: Relaxed Compliance with ventilator (intubated pts.): Tolerating ventilator or movement Vocalization (extubated pts.): N/A CPOT Total: 0    Home Living  Prior Function            PT Goals (current goals can now be found in the care plan section) Acute Rehab PT Goals Patient Stated Goal: not stated Progress towards PT goals: Not progressing toward goals - comment (pt reintubated)    Frequency    Min 3X/week      PT Plan Current plan remains appropriate    Co-evaluation              AM-PAC PT "6 Clicks" Mobility   Outcome Measure  Help needed turning from your back to your side while in a flat bed without using bedrails?: Total Help needed moving from lying on your back to sitting on the side of a flat bed  without using bedrails?: Total Help needed moving to and from a bed to a chair (including a wheelchair)?: Total Help needed standing up from a chair using your arms (e.g., wheelchair or bedside chair)?: Total Help needed to walk in hospital room?: Total Help needed climbing 3-5 steps with a railing? : Total 6 Click Score: 6    End of Session Equipment Utilized During Treatment: Oxygen (vent) Activity Tolerance: Treatment limited secondary to medical complications (Comment) Patient left: in bed;with call bell/phone within reach;with bed alarm set   PT Visit Diagnosis: Other abnormalities of gait and mobility (R26.89);Other symptoms and signs involving the nervous system (R29.898);Muscle weakness (generalized) (M62.81);Hemiplegia and hemiparesis Hemiplegia - Right/Left: Left Hemiplegia - caused by: Nontraumatic intracerebral hemorrhage     Time: 1439-1454 PT Time Calculation (min) (ACUTE ONLY): 15 min  Charges:  $Therapeutic Exercise: 8-22 mins                     [place] [facility] [place] 09/07/2021, 4:49 PM

## 2021-09-07 NOTE — Progress Notes (Addendum)
STROKE TEAM PROGRESS NOTE   INTERVAL HISTORY Patient is awake and alert on trach via vent, lots of copious secretions. She is following commands, EVD in place, with 185cc output over 24 hrs.  Receiving HD now at the bedside. VP shunt tentatively planned for Wednesday   Vitals:   09/07/21 0715 09/07/21 0745 09/07/21 0750 09/07/21 0800  BP: (!) 142/65 (!) 121/55 (!) 121/55 127/64  Pulse:   75 73  Resp:   (!) 29 (!) 26  Temp:      TempSrc:      SpO2:   100% 99%  Weight:      Height:       CBC:  Recent Labs  Lab 09/02/21 1119 09/05/21 0334 09/06/21 0549 09/07/21 0545  WBC 10.6*   < > 10.3 9.5  NEUTROABS 7.6  --   --   --   HGB 10.4*   < > 9.3* 9.5*  HCT 32.8*   < > 29.0* 28.1*  MCV 77.9*   < > 76.5* 73.8*  PLT 128*   < > 213 214   < > = values in this interval not displayed.    Basic Metabolic Panel:  Recent Labs  Lab 09/06/21 0549 09/07/21 0545  NA 132* 131*  K 5.5* 4.4  CL 87* 87*  CO2 25 24  GLUCOSE 148* 110*  BUN 85* 65*  CREATININE 8.11* 5.95*  CALCIUM 10.2 9.9  MG  --  2.0  PHOS 4.6 2.8      IMAGING past 24 hours No results found.  PHYSICAL EXAM  Physical Exam  Constitutional: Appears well-developed and well-nourished middle-age African-American lady.  Cardiovascular: Normal rate and regular rhythm.  Respiratory: Effort normal, non-labored breathing, on trach via vent  Neuro -s/p tracheostomy without sedation, she is awake eyes open with mild left ptosis, able to follow simple commands.  Left gaze preference but able to overcome. PERRL. Gag and cough present. Face appears symmetrical.  Tongue protrusion midline.   Right side upper and lower extremity spontaneous movement, left upper and lower extremity hemiplegia  Sensation grimaces and withdraws to noxious stimuli coordination unable to test and gait not tested.   ASSESSMENT/PLAN [name] is a 56 y.o. female with history of ESRD on HD MWF, CHF, HTN, DM presenting with nausea,  vomiting, left side weakness present upon waking. Intubated emergently at [facility] and transferred to [facility] on 5/59.  NSGY consulted, EVD placement 5/29. MRI shows stable size in right thalamic hemorrhage with edema extending to the pons and midbrain. EVD still in place. Extubated 08/26/2021. Reintubated 08/27/2021 and then tracheostomy placed. Consult for PEG tube sent to trauma team.   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 Small volume intraventricular extension of hemorrhage. Mild hydrocephalus. 5/29 Head CT 2200- decrease in size of lateral and third ventricles s/p EVD placement  Repeat CT head 6/12 -1. No significant interval change in the right thalamic hemorrhage with associated mass effect and mild compression of the third  ventricle. 2. Slight interval increase in the size of the lateral ventricles compared to CT of 09/03/2021 which may represent mild hydrocephalus. CTA head & neck No arterial lesion underlying the right  thalamic hematoma. Equivocal for spot sign which would be an adverse finding suggesting continued growth MRI  no change in size of the right thalamic intraparenchymal hemorrhage extending to midbrain, maximal dimension 3.6 cm by MRI. Surrounding edema, likely slightly more prominent. tricles are however similar in size to the CT of 08/23/2021. 2D Echo EF 60-65%, consistent with grade II diastolic dysfunction EEG - mild diffuse encephalopathy LDL 38 HgbA1c 6.2 VTE prophylaxis - SCDs aspirin 81 mg daily prior to admission, now on No antithrombotic.  Therapy recommendations:  CIR Disposition:  Pending  Obstructive Hydrocephalus NSGY on board 5/29- EVD placement  Follow up  CT shows slightly decrease in size of lateral and third ventricle  EVD at 20 cm H2O Unable to tolerate clamping trial with nausea and vomiting. Attempt clamp trial today. VP shunt scheduled for Wednesday  Cerebral edema On 3% saline @ 75- d/c'd Given ESRD, try to avoid fluid overload, will d/c 3% saline Last NA 131  Acute Hypoxic Respiratory failure Intubated 5/29 CCM managing vent Extubated 6/1 Reintubated and s/p trach 6/2 Required being placed on ventilator yesterday - attempt to wean back to trach collar as tolerated  On Zosyn for HCAP  Hypertensive emergency-resolved Home meds:  labetolol, hydralazine, losartan Stable BP less than 160 On losartan 100, labetalol 200 3 times daily, hydralazine 50 3 times daily,  amlodipine 10 Long-term BP goal normotensive cleviprex off  End Stage Renal Failure on hemodialysis MWF Nephrology consulted Dialysis Scheduled for today  Monitor BMP/electrolytes  Diabetes Mellitus A1c 6.2, controlled CBGs SSI Close PCP follow-up  Other Stroke Risk Factors Obesity, Body mass index is 33.7 kg/m., BMI >/= 30 associated with increased stroke risk, recommend weight loss, diet and exercise as appropriate  Congestive heart failure  Other Active Problems Hyperkalemia  K 6.1->4.2-> 3.5, s/p HD and Lokelma Dysphagia - cortrak placed on [facility] day # Strang DNP, ACNPC-AG   ATTENDING ATTESTATION:   56 year old with right thalamic intracranial hemorrhage and significant left hemiplegia.  She has EVD placed . Plan is for possible VP shunt tomorrow.  Not requiring pressors at the moment.  She was put on vent overnight.  Continues to have copious secretions.  Getting hemodialysis today.  Continue zosyn as there is concern for possible sepsis being managed by CCM.    Plan discussed with Dr. Carlis Abbott and CCM team.      Dr. Reeves Forth evaluated pt independently, reviewed imaging, chart, labs. Discussed and formulated plan with the APP.  Please see APP note above for details.     This patient is critically ill due to respiratory distress, ICH s/p EVD ,  and at significant risk of neurological worsening, death form heart failure, respiratory failure, recurrent stroke, bleeding from [facility], seizure, sepsis. This patient's care requires constant monitoring of vital signs, hemodynamics, respiratory and cardiac monitoring, review of multiple databases, neurological assessment, discussion with family, other specialists and medical decision making of high complexity. I spent 35 minutes of neurocritical care time in the care of this patient.    Lacharles Altschuler,MD      To contact Stroke Continuity provider, please refer to [email or web address] After hours, contact General Neurology

## 2021-09-07 NOTE — Progress Notes (Signed)
SLP Cancellation Note  Patient Details Name: Janet Mitchell MRN: [id number] DOB: [birth year]   Cancelled treatment:       Reason Eval/Treat Not Completed: Medical issues which prohibited therapy. Touched base with RN this morning. Pt getting HD this am, had to go back on the vent last night. Will check back in as able once able to resume TC.     Osie Bond., M.A. [place] Office [phone]  Secure chat preferred  09/07/2021, 11:19 AM

## 2021-09-07 NOTE — TOC CAGE-AID Note (Signed)
Transition of Care [facility]) - CAGE-AID Screening   Patient Details  Name: Janet Mitchell MRN: [id number] Date of Birth: 1966-03-22  Transition of Care [facility]) CM/SW Contact:    Blondell Laperle C Tarpley-Carter, [place] Phone Number: 09/07/2021, 1:45 PM   Clinical Narrative: Pt is unable to participate in Cage Aid. Pt is critically ill.  Xaiden Fleig Tarpley-Carter, MSW, LCSW-A Pronouns:  She/Her/Hers [facility] Health[facility]itions of Care Clinical Social Worker Direct Number:  [phone] [email or web address]  CAGE-AID Screening: Substance Abuse Screening unable to be completed due to: : Patient unable to participate

## 2021-09-08 ENCOUNTER — Inpatient Hospital Stay (HOSPITAL_COMMUNITY): Payer: 59 | Admitting: Certified Registered"

## 2021-09-08 ENCOUNTER — Other Ambulatory Visit: Payer: Self-pay

## 2021-09-08 ENCOUNTER — Encounter (HOSPITAL_COMMUNITY)
Admission: EM | Disposition: A | Discharge status: Skilled Nursing Facility | Payer: Self-pay | Source: Home / Self Care | Attending: Neurology

## 2021-09-08 DIAGNOSIS — I10 Essential (primary) hypertension: Secondary | ICD-10-CM

## 2021-09-08 DIAGNOSIS — J9601 Acute respiratory failure with hypoxia: Secondary | ICD-10-CM | POA: Diagnosis not present

## 2021-09-08 DIAGNOSIS — D649 Anemia, unspecified: Secondary | ICD-10-CM

## 2021-09-08 DIAGNOSIS — Z9911 Dependence on respirator [ventilator] status: Secondary | ICD-10-CM | POA: Diagnosis not present

## 2021-09-08 DIAGNOSIS — G919 Hydrocephalus, unspecified: Secondary | ICD-10-CM | POA: Diagnosis not present

## 2021-09-08 DIAGNOSIS — E119 Type 2 diabetes mellitus without complications: Secondary | ICD-10-CM | POA: Diagnosis not present

## 2021-09-08 DIAGNOSIS — J156 Pneumonia due to other aerobic Gram-negative bacteria: Secondary | ICD-10-CM

## 2021-09-08 DIAGNOSIS — G911 Obstructive hydrocephalus: Secondary | ICD-10-CM | POA: Diagnosis not present

## 2021-09-08 DIAGNOSIS — J1520 Pneumonia due to staphylococcus, unspecified: Secondary | ICD-10-CM

## 2021-09-08 DIAGNOSIS — N186 End stage renal disease: Secondary | ICD-10-CM | POA: Diagnosis not present

## 2021-09-08 DIAGNOSIS — I629 Nontraumatic intracranial hemorrhage, unspecified: Secondary | ICD-10-CM | POA: Diagnosis not present

## 2021-09-08 HISTORY — PX: VENTRICULOPERITONEAL SHUNT: SHX204

## 2021-09-08 HISTORY — PX: LAPAROSCOPIC REVISION VENTRICULAR-PERITONEAL (V-P) SHUNT: SHX5924

## 2021-09-08 LAB — RENAL FUNCTION PANEL
Albumin: 2.9 g/dL — ABNORMAL LOW (ref 3.5–5.0)
Anion gap: 19 — ABNORMAL HIGH (ref 5–15)
BUN: 70 mg/dL — ABNORMAL HIGH (ref 6–20)
CO2: 24 mmol/L (ref 22–32)
Calcium: 10.3 mg/dL (ref 8.9–10.3)
Chloride: 88 mmol/L — ABNORMAL LOW (ref 98–111)
Creatinine, Ser: 6.35 mg/dL — ABNORMAL HIGH (ref 0.44–1.00)
GFR, Estimated: 7 mL/min — ABNORMAL LOW (ref >60)
Glucose, Bld: 133 mg/dL — ABNORMAL HIGH (ref 70–99)
Phosphorus: 4.9 mg/dL — ABNORMAL HIGH (ref 2.5–4.6)
Potassium: 6.6 mmol/L (ref 3.5–5.1)
Sodium: 131 mmol/L — ABNORMAL LOW (ref 135–145)

## 2021-09-08 LAB — CBC
HCT: 30.2 % — ABNORMAL LOW (ref 36.0–46.0)
Hemoglobin: 9.7 g/dL — ABNORMAL LOW (ref 12.0–15.0)
MCH: 24.5 pg — ABNORMAL LOW (ref 26.0–34.0)
MCHC: 32.1 g/dL (ref 30.0–36.0)
MCV: 76.3 fL — ABNORMAL LOW (ref 80.0–100.0)
Platelets: 206 10*3/uL (ref 150–400)
RBC: 3.96 MIL/uL (ref 3.87–5.11)
RDW: 16.1 % — ABNORMAL HIGH (ref 11.5–15.5)
WBC: 8.5 10*3/uL (ref 4.0–10.5)
nRBC: 0 % (ref 0.0–0.2)

## 2021-09-08 LAB — GLUCOSE, CAPILLARY
Glucose-Capillary: 101 mg/dL — ABNORMAL HIGH (ref 70–99)
Glucose-Capillary: 137 mg/dL — ABNORMAL HIGH (ref 70–99)
Glucose-Capillary: 217 mg/dL — ABNORMAL HIGH (ref 70–99)
Glucose-Capillary: 285 mg/dL — ABNORMAL HIGH (ref 70–99)
Glucose-Capillary: 80 mg/dL (ref 70–99)

## 2021-09-08 SURGERY — SHUNT INSERTION VENTRICULAR-PERITONEAL
Anesthesia: General | Site: Head

## 2021-09-08 MED ORDER — LIDOCAINE-EPINEPHRINE 1 %-1:100000 IJ SOLN
INTRAMUSCULAR | Status: AC
  Filled 2021-09-08: qty 1

## 2021-09-08 MED ORDER — SODIUM CHLORIDE 0.9 % IV SOLN
1.0000 g | INTRAVENOUS | Status: AC
  Administered 2021-09-09 – 2021-09-14 (×5): 1 g via INTRAVENOUS
  Filled 2021-09-08 (×6): qty 10

## 2021-09-08 MED ORDER — PHENYLEPHRINE HCL (PRESSORS) 10 MG/ML IV SOLN
INTRAVENOUS | Status: AC
  Filled 2021-09-08: qty 1

## 2021-09-08 MED ORDER — SODIUM CHLORIDE 0.9 % IV SOLN
1.0000 g | INTRAVENOUS | Status: AC
Start: 2021-09-08 — End: 2021-09-08
  Administered 2021-09-08: 1 g via INTRAVENOUS
  Filled 2021-09-08: qty 10

## 2021-09-08 MED ORDER — FENTANYL CITRATE (PF) 250 MCG/5ML IJ SOLN
INTRAMUSCULAR | Status: DC | PRN
  Administered 2021-09-08: 50 ug via INTRAVENOUS
  Administered 2021-09-08 (×2): 25 ug via INTRAVENOUS
  Administered 2021-09-08: 50 ug via INTRAVENOUS

## 2021-09-08 MED ORDER — CALCIUM GLUCONATE-NACL 1-0.675 GM/50ML-% IV SOLN
1.0000 g | Freq: Once | INTRAVENOUS | Status: DC
Start: 2021-09-08 — End: 2021-09-08

## 2021-09-08 MED ORDER — SODIUM CHLORIDE 0.9 % IV SOLN
1.0000 g | Freq: Once | INTRAVENOUS | Status: DC
  Filled 2021-09-08: qty 10

## 2021-09-08 MED ORDER — 0.9 % SODIUM CHLORIDE (POUR BTL) OPTIME
TOPICAL | Status: DC | PRN
  Administered 2021-09-08: 1000 mL

## 2021-09-08 MED ORDER — PHENYLEPHRINE 80 MCG/ML (10ML) SYRINGE FOR IV PUSH (FOR BLOOD PRESSURE SUPPORT)
PREFILLED_SYRINGE | INTRAVENOUS | Status: DC | PRN
  Administered 2021-09-08: 160 ug via INTRAVENOUS
  Administered 2021-09-08 (×3): 80 ug via INTRAVENOUS

## 2021-09-08 MED ORDER — OSMOLITE 1.5 CAL PO LIQD
1000.0000 mL | ORAL | Status: DC
  Administered 2021-09-08 – 2021-09-11 (×3): 1000 mL

## 2021-09-08 MED ORDER — CEFAZOLIN SODIUM-DEXTROSE 2-4 GM/100ML-% IV SOLN
2.0000 g | INTRAVENOUS | Status: AC
  Administered 2021-09-08: 2 g via INTRAVENOUS
  Filled 2021-09-08: qty 100

## 2021-09-08 MED ORDER — PHENYLEPHRINE HCL-NACL 20-0.9 MG/250ML-% IV SOLN
INTRAVENOUS | Status: DC | PRN
  Administered 2021-09-08: 30 ug/min via INTRAVENOUS

## 2021-09-08 MED ORDER — PROPOFOL 10 MG/ML IV BOLUS
INTRAVENOUS | Status: DC | PRN
  Administered 2021-09-08: 20 mg via INTRAVENOUS
  Administered 2021-09-08: 50 mg via INTRAVENOUS

## 2021-09-08 MED ORDER — LIDOCAINE-EPINEPHRINE 1 %-1:100000 IJ SOLN
INTRAMUSCULAR | Status: DC | PRN
  Administered 2021-09-08: 9 mL

## 2021-09-08 MED ORDER — ROCURONIUM BROMIDE 10 MG/ML (PF) SYRINGE
PREFILLED_SYRINGE | INTRAVENOUS | Status: DC | PRN
  Administered 2021-09-08: 50 mg via INTRAVENOUS
  Administered 2021-09-08: 20 mg via INTRAVENOUS
  Administered 2021-09-08 (×2): 30 mg via INTRAVENOUS

## 2021-09-08 MED ORDER — PROSOURCE TF PO LIQD
45.0000 mL | Freq: Three times a day (TID) | ORAL | Status: DC
Start: 2021-09-08 — End: 2021-09-14
  Administered 2021-09-08 – 2021-09-14 (×17): 45 mL
  Filled 2021-09-08 (×19): qty 45

## 2021-09-08 MED ORDER — DEXAMETHASONE SODIUM PHOSPHATE 10 MG/ML IJ SOLN
INTRAMUSCULAR | Status: DC | PRN
  Administered 2021-09-08: 10 mg via INTRAVENOUS

## 2021-09-08 MED ORDER — HEPARIN SODIUM (PORCINE) 5000 UNIT/ML IJ SOLN
5000.0000 [IU] | Freq: Three times a day (TID) | INTRAMUSCULAR | Status: DC
Start: 2021-09-09 — End: 2021-09-15
  Administered 2021-09-09 – 2021-09-15 (×19): 5000 [IU] via SUBCUTANEOUS
  Filled 2021-09-08 (×19): qty 1

## 2021-09-08 MED ORDER — DARBEPOETIN ALFA 100 MCG/0.5ML IJ SOSY: 100.0000 ug | PREFILLED_SYRINGE | INTRAMUSCULAR | Status: DC

## 2021-09-08 MED ORDER — CHLORHEXIDINE GLUCONATE CLOTH 2 % EX PADS: 6.0000 | MEDICATED_PAD | Freq: Once | CUTANEOUS | Status: DC

## 2021-09-08 MED ORDER — CHLORHEXIDINE GLUCONATE CLOTH 2 % EX PADS
6.0000 | MEDICATED_PAD | Freq: Once | CUTANEOUS | Status: AC
  Administered 2021-09-08: 6 via TOPICAL

## 2021-09-08 MED ORDER — CEFAZOLIN SODIUM-DEXTROSE 2-3 GM-%(50ML) IV SOLR
INTRAVENOUS | Status: DC | PRN
  Administered 2021-09-08: 2 g via INTRAVENOUS

## 2021-09-08 MED ORDER — FENTANYL CITRATE (PF) 250 MCG/5ML IJ SOLN
INTRAMUSCULAR | Status: AC
  Filled 2021-09-08: qty 5

## 2021-09-08 MED ORDER — SODIUM BICARBONATE 8.4 % IV SOLN
50.0000 meq | Freq: Once | INTRAVENOUS | Status: AC
  Administered 2021-09-08: 50 meq via INTRAVENOUS
  Filled 2021-09-08: qty 50

## 2021-09-08 SURGICAL SUPPLY — 59 items
ADH SKN CLS APL DERMABOND .7 (GAUZE/BANDAGES/DRESSINGS) ×4
BAG COUNTER SPONGE SURGICOUNT (BAG) ×5 IMPLANT
BAG SPNG CNTER NS LX DISP (BAG) ×4
BLADE CLIPPER SURG (BLADE) ×7 IMPLANT
BOOT SUTURE AID YELLOW STND (SUTURE) ×4 IMPLANT
BUR ACORN 9.0 PRECISION (BURR) ×4 IMPLANT
CANISTER SUCT 3000ML PPV (MISCELLANEOUS) ×4 IMPLANT
CATH PERITONEAL BACTISEAL 120 (Shunt) ×1 IMPLANT
DERMABOND ADVANCED (GAUZE/BANDAGES/DRESSINGS) ×2
DERMABOND ADVANCED .7 DNX12 (GAUZE/BANDAGES/DRESSINGS) IMPLANT
DRAPE INCISE IOBAN 66X45 STRL (DRAPES) ×5 IMPLANT
DRAPE ORTHO SPLIT 77X108 STRL (DRAPES) ×6
DRAPE SHEET LG 3/4 BI-LAMINATE (DRAPES) ×8 IMPLANT
DRAPE SURG ORHT 6 SPLT 77X108 (DRAPES) ×6 IMPLANT
DRSG AQUACEL AG 3.5X4 (GAUZE/BANDAGES/DRESSINGS) IMPLANT
DRSG AQUACEL AG ADV 3.5X 4 (GAUZE/BANDAGES/DRESSINGS) ×2 IMPLANT
DRSG OPSITE POSTOP 3X4 (GAUZE/BANDAGES/DRESSINGS) IMPLANT
DRSG XEROFORM 1X8 (GAUZE/BANDAGES/DRESSINGS) ×4 IMPLANT
DURAPREP 26ML APPLICATOR (WOUND CARE) ×10 IMPLANT
ELECT COATED BLADE 2.86 ST (ELECTRODE) ×4 IMPLANT
ELECT REM PT RETURN 9FT ADLT (ELECTROSURGICAL) ×3
ELECTRODE REM PT RTRN 9FT ADLT (ELECTROSURGICAL) ×3 IMPLANT
GAUZE 4X4 16PLY ~~LOC~~+RFID DBL (SPONGE) IMPLANT
GLOVE BIOGEL PI IND STRL 7.5 (GLOVE) ×3 IMPLANT
GLOVE BIOGEL PI INDICATOR 7.5 (GLOVE) ×4
GLOVE ECLIPSE 7.5 STRL STRAW (GLOVE) ×6 IMPLANT
GLOVE SURG ENC MOIS LTX SZ8 (GLOVE) ×4 IMPLANT
GLOVE SURG UNDER POLY LF SZ8.5 (GLOVE) ×4 IMPLANT
GOWN STRL REUS W/ TWL LRG LVL3 (GOWN DISPOSABLE) IMPLANT
GOWN STRL REUS W/ TWL XL LVL3 (GOWN DISPOSABLE) ×6 IMPLANT
GOWN STRL REUS W/TWL 2XL LVL3 (GOWN DISPOSABLE) IMPLANT
GOWN STRL REUS W/TWL LRG LVL3 (GOWN DISPOSABLE) ×6
GOWN STRL REUS W/TWL XL LVL3 (GOWN DISPOSABLE) ×6
KIT BASIN OR (CUSTOM PROCEDURE TRAY) ×4 IMPLANT
KIT TURNOVER KIT B (KITS) ×4 IMPLANT
NEEDLE HYPO 22GX1.5 SAFETY (NEEDLE) ×4 IMPLANT
NS IRRIG 1000ML POUR BTL (IV SOLUTION) ×4 IMPLANT
PACK LAMINECTOMY NEURO (CUSTOM PROCEDURE TRAY) ×4 IMPLANT
PAD ARMBOARD 7.5X6 YLW CONV (MISCELLANEOUS) ×12 IMPLANT
SET TUBE SMOKE EVAC HIGH FLOW (TUBING) ×1 IMPLANT
SHEATH PERITONEAL INTRO 46 (SHEATH) IMPLANT
SHEATH PERITONEAL INTRO 61 (SHEATH) ×1 IMPLANT
STAPLER VISISTAT 35W (STAPLE) ×4 IMPLANT
SUT MNCRL AB 3-0 PS2 27 (SUTURE) ×2 IMPLANT
SUT MNCRL AB 4-0 PS2 18 (SUTURE) ×5 IMPLANT
SUT SILK 0 TIES 10X30 (SUTURE) ×4 IMPLANT
SUT SILK 2X60 BLK UNID (SUTURE) ×4 IMPLANT
SUT VIC AB 0 CT1 18XCR BRD8 (SUTURE) ×3 IMPLANT
SUT VIC AB 0 CT1 8-18 (SUTURE) ×3
SUT VIC AB 2-0 CP2 18 (SUTURE) ×4 IMPLANT
SUT VIC AB 3-0 SH 8-18 (SUTURE) ×5 IMPLANT
SYR CONTROL 10ML LL (SYRINGE) ×4 IMPLANT
TOWEL GREEN STERILE (TOWEL DISPOSABLE) ×4 IMPLANT
TOWEL GREEN STERILE FF (TOWEL DISPOSABLE) ×4 IMPLANT
TROCAR XCEL NON-BLD 5MMX100MML (ENDOMECHANICALS) ×1 IMPLANT
TROCAR Z-THREAD OPTICAL 5X100M (TROCAR) ×1 IMPLANT
UNDERPAD 30X36 HEAVY ABSORB (UNDERPADS AND DIAPERS) ×4 IMPLANT
VALVE CERTAS PLUS SYSTEM (Valve) ×1 IMPLANT
WATER STERILE IRR 1000ML POUR (IV SOLUTION) ×4 IMPLANT

## 2021-09-08 NOTE — Anesthesia Postprocedure Evaluation (Signed)
Anesthesia Post Note  Patient: Janet Mitchell  Procedure(s) Performed: SHUNT INSERTION VENTRICULAR-PERITONEAL (Head) LAPAROSCOPIC ASSISTANCE FOR  VENTRICULAR-PERITONEAL (V-P) SHUNT PLACEMENT  (Abdomen)     Patient location during evaluation: PACU Anesthesia Type: General Level of consciousness: awake and alert Pain management: pain level controlled Vital Signs Assessment: post-procedure vital signs reviewed and stable Respiratory status: spontaneous breathing, nonlabored ventilation, respiratory function stable and patient connected to nasal cannula oxygen Cardiovascular status: blood pressure returned to baseline and stable Postop Assessment: no apparent nausea or vomiting Anesthetic complications: no   No notable events documented.  Last Vitals:  Vitals:   09/08/21 1345 09/08/21 1400  BP: (!) 124/57 (!) 79/44  Pulse: 76 75  Resp: 13 (!) 23  Temp: 37.1 C   SpO2:  100%    Last Pain:  Vitals:   09/08/21 1359  TempSrc:   PainSc: 0-No pain                 Margareta Laureano S

## 2021-09-08 NOTE — Progress Notes (Signed)
Neurosurgery  Tmax of 100.6 in early morning, but afebrile since.  Eyes open spontaneously, plegic on left side, trach'd on ventilator.  Plan for VP shunt today.

## 2021-09-08 NOTE — Op Note (Addendum)
09/08/2021  5:20 PM  PATIENT:  Janet Mitchell  56 y.o. female  PRE-OPERATIVE DIAGNOSIS:  HYDROCEPHALUS  POST-OPERATIVE DIAGNOSIS:  HYDROCEPHALUS; numerous adhesions to abdominal wall  PROCEDURE:  Procedure(s): SHUNT INSERTION VENTRICULAR-PERITONEAL - Dr Marcello Moores LAPAROSCOPIC ASSISTANCE FOR  VENTRICULAR-PERITONEAL (V-P) SHUNT PLACEMENT - Dr Marcello Moores DIAGNOSTIC LAPAROSCOPY - Dr Redmond Pulling  Co-SURGEON:  Surgeon(s): Vallarie Mare, MD Greer Pickerel, MD  ASSISTANTS: none   ANESTHESIA:   general  DRAINS: none   LOCAL MEDICATIONS USED:  MARCAINE     SPECIMEN:  No Specimen  DISPOSITION OF SPECIMEN:  N/A  COUNTS:  YES  INDICATION FOR PROCEDURE: This is a 56 year old female with end-stage renal on dialysis with recent intracranial hemorrhage who was undergoing a VP shunt placement by neurosurgery this afternoon.  Upon trying to enter the abdominal cavity Dr. Marcello Moores felt that there were potentially adhesions to the anterior abdominal wall from potential prior surgery.  I was called in to assess.  PROCEDURE: Please see Dr. Manon Hilding op note for the majority of information regarding this case.  When I joined him in the operating room the patient had already been prepped and draped and was already intubated and he had already done a significant portion of the cranial portion as well as had made an incision in the right lower quadrant.  The abdominal wall muscle layers were retracted and hemostats were on what appeared to be the posterior rectus sheath in the right lower quadrant.  The patient had had a prior laparoscopic cholecystectomy in 2019 and had a remote history of peritoneal dialysis.  I reviewed that surgeons operative note and at that time of surgery there was no mention of significant intra-abdominal adhesions.  It was hard to tell through the right lower quadrant incision if there was bowel stuck to the anterior abdominal wall.  Therefore we extended the surgical prep into the left side  of the abdomen and draped the area sterilely.  A small incision was made at Palmer's point in the left upper quadrant.  Then using a 0 degree 5 mm laparoscope through a 5 mm trocar I advanced it through all layers of the abdominal wall.  Her peritoneum was thickened.  Pneumoperitoneum was smoothly established up to a patient pressure of 15 mmHg.  Laparoscope was advanced and abdominal cavity was surveilled.  There were numerous adhesions to the anterior abdominal wall.  The omentum was plastered to the anterior abdominal wall.  The transverse colon was tented up because of that.  The area around her prior supraumbilical incision was free of adhesions other than the omentum.  There was small bowel adhered to the right midabdomen and right lower quadrant where the right lower quadrant incision had been made.  However there was no air leak through that fascial opening.  I do not think it was a full-thickness peritoneal incision in the right lower quadrant since we are able to maintain pneumoperitoneum.  However there is definitely small bowel adhered in that area.  We found an area in the right midabdomen anterior wall where there is no adhesions.  There was a probably a 2 inch gap in the abdominal wall between small bowel adhesions and transverse colon adhesions.  Dr. Marcello Moores made a small incision was able to advance the VP shunt under direct visualization into the right upper quadrant.  I placed another trocar in the supraumbilical position under direct visualization to assist with advancing the shunt further into the abdominal cavity.  I placed the camera in that  trocar and visualize left upper quadrant where I gained access and there is no injury to surrounding viscera.  I then went back around and put 2 interrupted 3-0 Vicryl sutures and potentially some of the peritoneum I do not think it was actually small bowel wall.  Pneumoperitoneum was released and the trocars removed.  Please see Dr. Manon Hilding op note for  additional information regarding closure and the rest of his procedure  Findings: omentum matted to anterior abdominal wall, transverse colon tented up due to omentum sticking to anterior abdominal wall, couldn't visualize stomach due to colon being lifted up; dense small adhesions to anterior abdominal wall right midabdomen to RLQ, infraumbilical, LLQ; abdominal wall free of adhesions in midline area except for omentum in space in mid-line where 11m trocar placed above old supraumbilical incision; very narrow window of clear abdominal wall in RUQ where VP shunt was inserted  PLAN OF CARE:  already inpatient  PATIENT DISPOSITION:  PACU - hemodynamically stable.   Delay start of Pharmacological VTE agent (>24hrs) due to surgical blood loss or risk of bleeding:  n/a  ELeighton Ruff WRedmond Pulling MD, FACS General, Bariatric, & Minimally Invasive Surgery [facility] Hospital[facility], [place]

## 2021-09-08 NOTE — Op Note (Signed)
PREOP DIAGNOSIS: Hydrocephalus  POSTOP DIAGNOSIS: Hydrocephalus  PROCEDURE: Ventriculoperitoneal shunt placement with laparoscopic assistance  SURGEON: Duffy Rhody, MD  CO-SURGEON: Greer Pickerel, MD  ANESTHESIA: General Endotracheal  EBL: 25 ml  SPECIMENS: None  DRAINS: None  COMPLICATIONS: None immediate  CONDITION: Stable to ICU  SHUNT PLACED: Certas Plus, set to 4  HISTORY: This is a 56 yo F with ESRD who developed a large right thalamic/midbrain hypertensive hemorrhage.  She developed hydrocephalus requiring an EVD.  Weaning attempts were unsuccessful. Family wished to proceed with VP shunt placement.  PROCEDURE IN DETAIL: The patient was brought to the operating room and transferred to the operative table. After induction of general anesthesia, the patient was positioned on the operative table in the supine position with all pressure points meticulously padded. The skin of the scalp,  neck, chest, and abdomen were prepped in the usual sterile fashion.  The previously made right frontal scalp incision was opened sharply, and the external ventricular drain was identified.  A tunnel was then created using a hemostat subcutaneously to the retroauricular region.  Counter incision was made behind the ear, and the distal shunt apparatus was then passed from a distal to proximal fashion, and the valve was seated subcutaneously.  The shunt passer was then used to tunnel from the retroauricular incision into the right upper quadrant.   An incision was made on the abdomen and the subcutaneous tissue divided.  The anterior rectus sheath was cut longitudinally and the rectus fibers spread apart until the posterior rectus sheath was visualized.  A small opening was then made sharply posterior rectus sheath and peritoneum.  However, upon opening the peritoneum, I encountered bowel wall adhesed to the abdominal wall.  I called the general surgeon on-call Greer Pickerel for intraoperative  consultation.   He agreed it was hard to determine if it was bowel or just thickened peritoneal layer given her history of peritoneal dialysis.  As such, we decided to proceed with laparoscopic assisted peritoneal catheter placement.  Laparoscope confirmed bowel loops adhesed to the abdominal wall.  A suitable entry point free of adhesions was made.   The previously placed external ventricular drain was then removed, and using standard anatomic landmarks a 6 cm ventricular catheter was then soft passed down the same track.  Good clear CSF flow was obtained. The catheter was then connected to the valve apparatus and secured with a silk tie.  Spontaneous CSF flow distally was confirmed.  The distal catheter was then placed in the peritoneal cavity through the new abdominal incision and pulled through with a counter laparoscopic working incision.    At this point the scalp wounds were irrigated with copious amounts of bacitracin irrigation, and closed using 3-0 Vicryl stitches.  The skin was then closed using standard surgical skin staples.  Sterile dressings were then applied.  Abdominal wound was irrigated thoroughly and a 2-0 stitch was placed at the posterior rectus sheath and the anterior rectus sheath was closed tightly with 0 vicryl stitches.  The dermal layer was closed with 3-0 vicryl stitches in buried interrupted fashion, followed by 4-0 monocryl in subcuticular fashion and dermabond.  The laparoscopic incisions were closed with 3-0 monocryl and dermabond..  At the end of the case all sponge needle and instrument counts were correct.  The patient tolerated the procedure well.   No complications were noted

## 2021-09-08 NOTE — Progress Notes (Signed)
Received patient in bed, alert and oriented. Informed consent signed and in chart.  Time tx initiated:1000  Pre HD weight:98.7 kg  Pre HD VS:see chart  Time tx completed:1330  HD treatment completed. Patient tolerated well. Fistula/Graft/HD catheter without signs and symptoms of complications. Patient transported back to the room, alert and orient and in no acute distress. Report given to bedside RN.  Total UF removed:1200  Medication given: none  Post HD VS:see chart  Post HD weight: 97.6 kg

## 2021-09-08 NOTE — Progress Notes (Signed)
Patient ID: Janet Mitchell, female   DOB: July 19, 1965, 56 y.o.   MRN: [id number] [facility] Progress Note    Subjective:   Without acute clinical events noted overnight   Objective:   BP 129/63   Pulse 76   Temp 99.1 F (37.3 C) (Oral)   Resp 11   Ht '5\' 7"'$  (1.702 m)   Wt 97.2 kg   SpO2 98%   BMI 33.56 kg/m   Physical Exam: Gen: Somnolent, does not arouse to calling her name and spontaneously moving right arm CVS: Pulse regular rhythm, normal rate, S1 and S2 normal Resp: Status post tracheostomy with coarse/transmitted breath sounds bilaterally over anterior chest  Abd: Soft, obese, nontender, bowel sounds normal Ext: Trace lower extremity edema with protective heel dressings, left upper arm AV fistula with intact dressings and pulsatile  Home meds include - asa, tylenol, renavite, cadexomer iodine, phoslo, ceterizine, cholestyramine, hydralazine 10 bid, labetalol 200 bid, losartan 100, psyllium     OP HD: [facility] MWF  4h  97.5kg  450/500  2/2.5 bath  15ga  AVF  Hep none - mircera 100 ug q2 - rocaltrol 1.0 mcg po tiw     Assessment/ Plan:   Right thalamic intracranial hemorrhage: With intraventricular hemorrhage and hydrocephalus and ventriculostomy drain placement by neurosurgery.  Going for ventricular shunt surgery today. Mentation improving.  ESRD: is on MWF HD schedule. HD today.  Hyperkalemia: low K+ bath HD today Volume/ HTN: down to dry wt after extra HD last week. UF 2 L q HD as tolerated for now. BP's wnl, off all IV and po meds.   Anemia: Hb 9-10 range, will resume esa here w/ darbe 100 ug weekly on Wed.  CKD-MBD: On calcitriol for PTH suppression, currently not on binder while getting tube feeds. Phos in range.  Nutrition: On tube feeds with ongoing nutritional supplementation.  Resp failure/ sp trach - off vent, on trach collar Atrial fib: a/c on hold, off BB, in NSR    [employer], MD 09/08/2021, 9:20 AM  Recent Labs  Lab  09/07/21 0545 09/07/21 2012 09/08/21 0255  HGB 9.5* 9.4* 9.7*  ALBUMIN 2.8*  --  2.9*  CALCIUM 9.9  --  10.3  PHOS 2.8  --  4.9*  CREATININE 5.95*  --  6.35*  K 4.4  --  6.6*    Inpatient medications:  amantadine  200 mg Per Tube Weekly   calcitRIOL  1 mcg Per Tube Q M,W,F   chlorhexidine  15 mL Mouth Rinse BID   Chlorhexidine Gluconate Cloth  6 each Topical Q0600   Chlorhexidine Gluconate Cloth  6 each Topical Q0600   Chlorhexidine Gluconate Cloth  6 each Topical Once   fiber  1 packet Per Tube BID   [START ON 09/09/2021] heparin injection (subcutaneous)  5,000 Units Subcutaneous Q8H   insulin aspart  0-9 Units Subcutaneous Q4H   insulin aspart  6 Units Subcutaneous Q4H   mouth rinse  15 mL Mouth Rinse q12n4p   multivitamin  1 tablet Per Tube QHS   pantoprazole sodium  40 mg Per Tube Daily    sodium chloride Stopped (09/06/21 1141)   feeding supplement (VITAL AF 1.2 CAL) Stopped (09/08/21 0114)   piperacillin-tazobactam (ZOSYN)  IV Stopped (09/08/21 0536)   acetaminophen **OR** acetaminophen (TYLENOL) oral liquid 160 mg/5 mL **OR** acetaminophen, diphenhydrAMINE, fentaNYL (SUBLIMAZE) injection, Gerhardt's butt cream, ipratropium-albuterol, labetalol, ondansetron (ZOFRAN) IV, polyvinyl alcohol, senna-docusate

## 2021-09-08 NOTE — Progress Notes (Addendum)
Date and time results received: 09/08/21 0555 (use smartphrase ".now" to insert current time)  Test: Potassium Critical Value: 6.6  Name of Provider Notified: L.Royce Macadamia MD  Orders Received? Or Actions Taken?:  Awaiting orders

## 2021-09-08 NOTE — Progress Notes (Signed)
STROKE TEAM PROGRESS NOTE   INTERVAL HISTORY Patient is awake and alert on trach via vent, still has lots of copious secretions. She is following commands, EVD in place, plan for VP shunt today.  However, patient K 6.6 this a.m., will have emergent HD first and then go for VP shunt.  Vitals:   09/08/21 1130 09/08/21 1145 09/08/21 1200 09/08/21 1206  BP: 134/68 129/66 (!) 116/59   Pulse: 73 75 78 75  Resp: '13 16 19 '$ (!) 23  Temp:   98.6 F (37 C)   TempSrc:   Axillary   SpO2: 98% 99%  99%  Weight:      Height:       CBC:  Recent Labs  Lab 09/02/21 1119 09/05/21 0334 09/07/21 2012 09/08/21 0255  WBC 10.6*   < > 10.6* 8.5  NEUTROABS 7.6  --   --   --   HGB 10.4*   < > 9.4* 9.7*  HCT 32.8*   < > 27.7* 30.2*  MCV 77.9*   < > 75.7* 76.3*  PLT 128*   < > 197 206   < > = values in this interval not displayed.   Basic Metabolic Panel:  Recent Labs  Lab 09/07/21 0545 09/08/21 0255  NA 131* 131*  K 4.4 6.6*  CL 87* 88*  CO2 24 24  GLUCOSE 110* 133*  BUN 65* 70*  CREATININE 5.95* 6.35*  CALCIUM 9.9 10.3  MG 2.0  --   PHOS 2.8 4.9*     IMAGING past 24 hours No results found.  PHYSICAL EXAM  Physical Exam  Constitutional: Appears well-developed and well-nourished middle-age African-American lady.  Cardiovascular: Normal rate and regular rhythm.  Respiratory: Effort normal, non-labored breathing, on trach via vent  Neuro - awake, alert, eyes open, nonverbal on vent via trach, but following most simple commands.  Left gaze preference, however able to cross midline with incomplete right gaze, blinking to visual threat bilaterally, PERRL.  Left facial droop. Tongue midline.  Left hemiplegia, RUE and RLE 3/5. Sensation not cooperative but right FTN no ataxia, gait not tested.     ASSESSMENT/PLAN Janet Mitchell is a 56 y.o. female with history of ESRD on HD MWF, CHF, HTN, DM presenting with nausea, vomiting, left side weakness present upon waking. Intubated  emergently at [facility] and transferred to [facility] on 5/59.  NSGY consulted, EVD placement 5/29. MRI shows stable size in right thalamic hemorrhage with edema extending to the pons and midbrain. EVD still in place. Extubated 08/26/2021. Reintubated 08/27/2021 and then tracheostomy placed. Consult for PEG tube sent to trauma team.   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 Small volume intraventricular extension of hemorrhage. Mild hydrocephalus. 5/29 Head CT 2200- decrease in size of lateral and third ventricles s/p EVD placement  Repeat CT head 6/12 -1. No significant interval change in the right thalamic hemorrhage with associated mass effect and mild compression of the third  ventricle. 2. Slight interval increase in the size of the lateral ventricles compared to CT of 09/03/2021 which may represent mild hydrocephalus. CTA head & neck No arterial lesion underlying the right thalamic hematoma. Equivocal for spot sign which  would be an adverse finding suggesting continued growth MRI  no change in size of the right thalamic intraparenchymal hemorrhage extending to midbrain, maximal dimension 3.6 cm by MRI. Surrounding edema, likely slightly more prominent ventricles are however similar in size to the CT of 08/23/2021. 2D Echo EF 60-65%, consistent with grade II diastolic dysfunction EEG - mild diffuse encephalopathy LDL 38 HgbA1c 6.2 VTE prophylaxis - SCDs aspirin 81 mg daily prior to admission, now on No antithrombotic.  Therapy recommendations:  CIR Disposition:  Pending  Obstructive Hydrocephalus NSGY on board 5/29- EVD placement  Follow up CT shows slightly decrease in size of lateral and third  ventricle  EVD at 20 cm H2O Unable to tolerate clamping trial with nausea and vomiting.  VP shunt scheduled for today  Cerebral edema On 3% saline @ 75- d/c'd Given ESRD, off 3% saline Allow Na trending down gradually  Acute Hypoxic Respiratory failure Intubated 5/29 CCM managing vent Extubated 6/1 Reintubated and s/p trach 6/2 Required being placed on ventilator 6/12  On Zosyn for HCAP  Hypertensive emergency-resolved Home meds:  labetolol, hydralazine, losartan Stable BP less than 160 On losartan 100, labetalol 200 3 times daily, hydralazine 50 3 times daily,  amlodipine 10 Long-term BP goal normotensive cleviprex off  End Stage Renal Failure on hemodialysis MWF Nephrology consulted Emergent HD today for hyperkalemia Monitor BMP/electrolytes  Diabetes Mellitus A1c 6.2, controlled CBGs SSI Close PCP follow-up  Other Stroke Risk Factors Obesity, Body mass index is 34.08 kg/m., BMI >/= 30 associated with increased stroke risk, recommend weight loss, diet and exercise as appropriate  Congestive heart failure  Other Active Problems Hyperkalemia  K 6.1->4.2-> 3.5->6.6, emergent HD Dysphagia - cortrak placed on TF, speech on board  Hospital day # 16    This patient is critically ill due to respiratory distress, ICH s/p EVD ,  and at significant risk of neurological worsening, death form heart failure, respiratory failure, recurrent stroke, bleeding from [facility], seizure, sepsis. This patient's care requires constant monitoring of vital signs, hemodynamics, respiratory and cardiac monitoring, review of multiple databases, neurological assessment, discussion with family, other specialists and medical decision making of high complexity. I spent 35 minutes of neurocritical care time in the care of this patient.    Janet Hawking, MD PhD Stroke Neurology 09/08/2021 8:33 PM      To contact Stroke Continuity provider, please refer to [email or web address] After hours, contact General  Neurology

## 2021-09-08 NOTE — Anesthesia Preprocedure Evaluation (Signed)
Anesthesia Evaluation  Patient identified by MRN, date of birth, ID band Patient awake    Reviewed: Patient's Chart, lab work & pertinent test results, Unable to perform ROS - Chart review only  Airway Mallampati: Trach  TM Distance: >3 FB Neck ROM: Full    Dental no notable dental hx.    Pulmonary neg pulmonary ROS,    Pulmonary exam normal breath sounds clear to auscultation       Cardiovascular hypertension, Normal cardiovascular exam Rhythm:Regular Rate:Normal     Neuro/Psych CVA negative psych ROS   GI/Hepatic negative GI ROS, Neg liver ROS,   Endo/Other  negative endocrine ROSdiabetes  Renal/GU DialysisRenal disease  negative genitourinary   Musculoskeletal negative musculoskeletal ROS (+)   Abdominal   Peds negative pediatric ROS (+)  Hematology  (+) Blood dyscrasia, anemia ,   Anesthesia Other Findings Hyperkalemia MWF dialysis  Reproductive/Obstetrics negative OB ROS                             Anesthesia Physical Anesthesia Plan  ASA: 4  Anesthesia Plan: General   Post-op Pain Management: Minimal or no pain anticipated   Induction: Intravenous  PONV Risk Score and Plan: 3 and Ondansetron, Dexamethasone, Treatment may vary due to age or medical condition and Midazolam  Airway Management Planned: Tracheostomy  Additional Equipment:   Intra-op Plan:   Post-operative Plan: Extubation in OR  Informed Consent: I have reviewed the patients History and Physical, chart, labs and discussed the procedure including the risks, benefits and alternatives for the proposed anesthesia with the patient or authorized representative who has indicated his/her understanding and acceptance.     Dental advisory given  Plan Discussed with: CRNA and Surgeon  Anesthesia Plan Comments:         Anesthesia Quick Evaluation

## 2021-09-08 NOTE — Progress Notes (Signed)
Inpatient Rehab Admissions Coordinator:    Pt. Currently on vent, not appropriate for CIR. Will follow for potential admit if she becomes appropriate.  Clemens Catholic, [place], [place] Admissions Coordinator  [phone] ([place]) [phone] (office)

## 2021-09-08 NOTE — Progress Notes (Signed)
Nutrition Follow-up  DOCUMENTATION CODES:   Obesity unspecified  INTERVENTION:   Tube feeding via cortrak tube: Post op resume TF Osmolite 1.5 @ 50 ml/hr (1200 ml/day) 45 ml ProSource TF TID  Provides: 1920 kcal, 108 grams protein, and 912 ml free water 2616 mg K 1500 mg Phos  NUTRITION DIAGNOSIS:   Inadequate oral intake related to inability to eat (pt sedated and ventilated) as evidenced by NPO status. Ongoing.   GOAL:   Provide needs based on ASPEN/SCCM guidelines Met with TF at goal.   MONITOR:   TF tolerance  REASON FOR ASSESSMENT:   Ventilator    ASSESSMENT:   56 y/o female with h/o ESRD on HD, HTN, DM, COVID 19 (03/2019) and CHF who is admitted with right thalamus ICH with IVH and obstructive hydrocephalus s/p right frontal ventriculostomy with EVD placement 5/29.  Pt discussed during ICU rounds and with RN and MD. Pt back on vent support due to new PNA. Pt in OR for VP shunt today. Discussed during rounds need for PEG placement, per MD likely early next week.  Resuming TF after OR.   05/29 - s/p EVD 06/02 - s/p trach and cortrak placement  06/14 - s/p VP shunt   Medications reviewed and include: nutrisource fiber, SSI, novolog, rena-vit, protonix   Labs reviewed: Na 131, PO 6.6 CBG's: 80-165  EVD: 324 ml  UF: 1200 ml removed this am  Post HD weight: 97.6 kg OP dry weight: 97.5 kg   Current TF:  Vital AF 1.2 at 70 ml/h  Provides 2016 kcal, 126 gm protein, 1362 ml free water daily  Diet Order:   Diet Order             Diet NPO time specified  Diet effective midnight                   EDUCATION NEEDS:   No education needs have been identified at this time  Skin:  Skin Assessment:  (eschar L great toe amputation site)  Last BM:  150 ml via rectal tube  Height:   Ht Readings from Last 1 Encounters:  08/23/21 '5\' 7"'  (1.702 m)    Weight:   Wt Readings from Last 1 Encounters:  09/08/21 97.6 kg    Ideal Body Weight:  61.3  kg  BMI:  Body mass index is 33.7 kg/m.  Estimated Nutritional Needs:   Kcal:  1900-2200kcal/day  Protein:  >125g/day  Fluid:  1.9-2.2L/day  Lockie Pares., RD, LDN, CNSC See AMiON for contact information

## 2021-09-08 NOTE — Progress Notes (Signed)
NAME:  Janet Mitchell, MRN:  [id number], DOB:  1966/01/08, LOS: 16 ADMISSION DATE:  08/23/2021, CONSULTATION DATE:  08/23/2021 REFERRING MD:  Dr. Rory Percy - Neuro CHIEF COMPLAINT: Code Stroke  History of Present Illness:  56 year old woman who presented to [facility] 5/29 with reported left-sided weakness, slurred speech, nausea/vomiting. LKN 6 hours prior to arrival. PMHx significant for HTN, CHF (Echo 07/2021 EF 60-65%, severe LVH, G2DD), T2DM (c/b diabetic neuropathy with subsequent L great toe amputation 2/2 ulcer/infection), ESRD (on HD MWF via [facility] TDC).  On arrival to ED, patient is somnolent with slight disconjugate gaze with near flaccid left upper arm and left leg weakness. BP 222/107. CT Head with 5cc acute hematoma in the right thalamus with intraventricular extension. Neurology consulted and patient transferred to [facility] for further evaluation. PCCM consulted for admission.  On arrival to unit patient is intubated, pupils 68m bilaterally and non-reactive. Withdrawals in all extremities, does not follow commands, on Propofol and Cleviprex gtt.    Pertinent Medical History:  ESRD with HD MWF, CHF, HTN, DM, s/p left great toe amputation secondary to ulcer/infection   Significant Hospital Events: Including procedures, antibiotic start and stop dates in addition to other pertinent events   [facility]28 [facility] HD, more awake, following commands with RUE/RLE 6/1 No issues overnight, she remains alert and interactive on vent.  Extubated 6/2 failed extubation requiring reintubation. Trached. 6/4 Placed on ATC yesterday afternoon and has tolerated well since 6/5: EVD raised to 20 cm H20 6/7: Failed EVD clamping trial after developing emesis and headache 6/8: Failed EVD clamping due to emesis 6/9: Increase EVD pressure to 25 6/12 back on vent due to hypoxia from mucus plugging, started back on antibiotics   Interim History / Subjective:   No acute events overnight. Planning for VP shunt today. Tmax 100.6.  Objective:  Blood pressure 132/73, pulse 84, temperature (!) 100.6 F (38.1 C), temperature source Axillary, resp. rate 20, height '5\' 7"'$  (1.702 m), weight 97.2 kg, SpO2 99 %.    Vent Mode: PRVC FiO2 (%):  [40 %] 40 % Set Rate:  [16 bmp] 16 bmp Vt Set:  [490 mL] 490 mL PEEP:  [5 cmH20] 5 cmH20 Pressure Support:  [15 cmH20] 15 cmH20 Plateau Pressure:  [11 cmH20-19 cmH20] 17 cmH20   Intake/Output Summary (Last 24 hours) at 09/08/2021 0708 Last data filed at 09/08/2021 0600 Gross per 24 hour  Intake 1159.93 ml  Output 2460 ml  Net -1300.07 ml    Filed Weights   09/05/21 0500 09/07/21 0909 09/08/21 0500  Weight: 97.6 kg 97.6 kg 97.2 kg   Physical Examination:  General: critically ill appearing woman lying in bed in NAD HEENT: EVD R scalp, no erythema or drainage. Neck: trach in place. Neuro: awake, tracking, moving R side briskly, able to minimally move left knee on command, and not moving fingers.  CV:  S1S2, RRR PULM: thick tan secretions from ETT, synchronous with MV, less rhonchi. GI: soft, NT Extremities: minimal peripheral edema, no cyanosis or clubbing. Skin: warm, dry, no rashes  Na+  131 K+ 6.6 BUN 70 Cr 6.35 WBC 8.5 H/H 9.7/30.2 Platelets 206 Resp culture> staph aureus, rare enterobacter aerogens Blood culture> NGTD BG 130-160s  Assessment & Plan:   Intracranial hemorrhage- Large right thalamic bleed Acquired obstructive hydrocephalus due to thalamic bleed MRI shows stable bleed and ventricular size, small increase in surrounding edema compared to admission CT.  -Con't supportive  care --planning for VP shunt today. Hold heparin Ulen for surgery today, can resume tomorrow pending approval from NS --con't cortak for nutrition, con't working with SLP as able; being back on the vent has slowed progress here  Hypertensive emergency, resolved -off antihypertensives with mostly normotensive Bps,  infrequent mild HTN  ESRD on dialysis hyperkalemia due to end-stage renal disease -HD again today; nephrology gave bicarb as a temporizing measure -renally dose meds -strict I/Os  Hyponatremia, likely due to end-stage renal disease -HD per nephrology  Chronic anemia due to ESRD -transfuse for Hb <7 or hemodynamically significant bleeding  Hyperglycemia, better controlled --Con't TF coverage 6 units q4h with hold parameters -goal BG 140-180 --SSI PRN  Acute respiratory failure with hypoxia requiring vent, trach for prolonged vent weaning Staph aureus and enterobacter pneumonia; concern for septic shock-- shock resolved - LTVV, can wean back to TC as tolerated. Need to be careful with secretions and high risk of mucus plugging. - follow sensitivities on culture --con't Zosyn  Husband updated at bedside this morning.  Best Practice: (right click and "Reselect all SmartList Selections" daily)   Diet/type: tubefeeds- on hold fo rOR DVT prophylaxis: Heparin tid GI prophylaxis: N/A Lines: N/A Foley:  N/A Code Status:  full code Last date of multidisciplinary goals of care discussion [6/14]  This patient is critically ill with multiple organ system failure which requires frequent high complexity decision making, assessment, support, evaluation, and titration of therapies. This was completed through the application of advanced monitoring technologies and extensive interpretation of multiple databases. During this encounter critical care time was devoted to patient care services described in this note for 40 minutes.    Julian Hy, DO 09/08/21 8:14 AM [facility]

## 2021-09-08 NOTE — Progress Notes (Signed)
SLP Cancellation Note  Patient Details Name: Janet Mitchell MRN: [id number] DOB: [birth year]   Cancelled treatment:        Pt presently on PS/CPAP respiratory support and receiving CRRT. Will continue to follow and will be able to proceed with therapy once again tolerating trach collar. If unable, can possibly seen for inline valve if she is able to sustain alertness.    [name] 09/08/2021, 10:53 AM

## 2021-09-08 NOTE — Transfer of Care (Signed)
Immediate Anesthesia Transfer of Care Note  Patient: Janet Mitchell  Procedure(s) Performed: SHUNT INSERTION VENTRICULAR-PERITONEAL (Head) LAPAROSCOPIC ASSISTANCE FOR  VENTRICULAR-PERITONEAL (V-P) SHUNT PLACEMENT  (Abdomen)  Patient Location: ICU  Anesthesia Type:General  Level of Consciousness: drowsy and Patient remains intubated per anesthesia plan  Airway & Oxygen Therapy: Patient Spontanous Breathing and Patient remains intubated per anesthesia plan  Post-op Assessment: Report given to RN and Post -op Vital signs reviewed and stable  Post vital signs: Reviewed and stable  Last Vitals:  Vitals Value Taken Time  BP 177/90 09/08/21 1730  Temp    Pulse 78 09/08/21 1738  Resp 15 09/08/21 1738  SpO2 98 % 09/08/21 1738  Vitals shown include unvalidated device data.  Last Pain:  Vitals:   09/08/21 1359  TempSrc:   PainSc: 0-No pain         Complications: No notable events documented.

## 2021-09-09 ENCOUNTER — Telehealth: Payer: Self-pay | Admitting: Podiatry

## 2021-09-09 ENCOUNTER — Encounter (INDEPENDENT_AMBULATORY_CARE_PROVIDER_SITE_OTHER): Payer: 59 | Admitting: Ophthalmology

## 2021-09-09 ENCOUNTER — Encounter (HOSPITAL_COMMUNITY): Payer: Self-pay | Admitting: Neurosurgery

## 2021-09-09 DIAGNOSIS — G911 Obstructive hydrocephalus: Secondary | ICD-10-CM | POA: Diagnosis not present

## 2021-09-09 DIAGNOSIS — N186 End stage renal disease: Secondary | ICD-10-CM | POA: Diagnosis not present

## 2021-09-09 DIAGNOSIS — I629 Nontraumatic intracranial hemorrhage, unspecified: Secondary | ICD-10-CM | POA: Diagnosis not present

## 2021-09-09 DIAGNOSIS — J1520 Pneumonia due to staphylococcus, unspecified: Secondary | ICD-10-CM | POA: Diagnosis not present

## 2021-09-09 DIAGNOSIS — Z9911 Dependence on respirator [ventilator] status: Secondary | ICD-10-CM | POA: Diagnosis not present

## 2021-09-09 DIAGNOSIS — J156 Pneumonia due to other aerobic Gram-negative bacteria: Secondary | ICD-10-CM | POA: Diagnosis not present

## 2021-09-09 DIAGNOSIS — J9601 Acute respiratory failure with hypoxia: Secondary | ICD-10-CM | POA: Diagnosis not present

## 2021-09-09 LAB — CBC WITH DIFFERENTIAL/PLATELET
Abs Immature Granulocytes: 0.47 10*3/uL — ABNORMAL HIGH (ref 0.00–0.07)
Basophils Absolute: 0.1 10*3/uL (ref 0.0–0.1)
Basophils Relative: 1 %
Eosinophils Absolute: 0.1 10*3/uL (ref 0.0–0.5)
Eosinophils Relative: 1 %
HCT: 27.8 % — ABNORMAL LOW (ref 36.0–46.0)
Hemoglobin: 9.1 g/dL — ABNORMAL LOW (ref 12.0–15.0)
Immature Granulocytes: 5 %
Lymphocytes Relative: 11 %
Lymphs Abs: 1.1 10*3/uL (ref 0.7–4.0)
MCH: 25.3 pg — ABNORMAL LOW (ref 26.0–34.0)
MCHC: 32.7 g/dL (ref 30.0–36.0)
MCV: 77.4 fL — ABNORMAL LOW (ref 80.0–100.0)
Monocytes Absolute: 0.6 10*3/uL (ref 0.1–1.0)
Monocytes Relative: 6 %
Neutro Abs: 7.8 10*3/uL — ABNORMAL HIGH (ref 1.7–7.7)
Neutrophils Relative %: 76 %
Platelets: 200 10*3/uL (ref 150–400)
RBC: 3.59 MIL/uL — ABNORMAL LOW (ref 3.87–5.11)
RDW: 16.4 % — ABNORMAL HIGH (ref 11.5–15.5)
WBC: 10 10*3/uL (ref 4.0–10.5)
nRBC: 0 % (ref 0.0–0.2)

## 2021-09-09 LAB — RENAL FUNCTION PANEL
Albumin: 2.6 g/dL — ABNORMAL LOW (ref 3.5–5.0)
Anion gap: 15 (ref 5–15)
BUN: 51 mg/dL — ABNORMAL HIGH (ref 6–20)
CO2: 26 mmol/L (ref 22–32)
Calcium: 9.3 mg/dL (ref 8.9–10.3)
Chloride: 89 mmol/L — ABNORMAL LOW (ref 98–111)
Creatinine, Ser: 4.92 mg/dL — ABNORMAL HIGH (ref 0.44–1.00)
GFR, Estimated: 10 mL/min — ABNORMAL LOW (ref >60)
Glucose, Bld: 235 mg/dL — ABNORMAL HIGH (ref 70–99)
Phosphorus: 5.7 mg/dL — ABNORMAL HIGH (ref 2.5–4.6)
Potassium: 4.8 mmol/L (ref 3.5–5.1)
Sodium: 130 mmol/L — ABNORMAL LOW (ref 135–145)

## 2021-09-09 LAB — CULTURE, RESPIRATORY W GRAM STAIN: Gram Stain: NONE SEEN

## 2021-09-09 LAB — GLUCOSE, CAPILLARY
Glucose-Capillary: 228 mg/dL — ABNORMAL HIGH (ref 70–99)
Glucose-Capillary: 79 mg/dL (ref 70–99)
Glucose-Capillary: 145 mg/dL — ABNORMAL HIGH (ref 70–99)
Glucose-Capillary: 204 mg/dL — ABNORMAL HIGH (ref 70–99)
Glucose-Capillary: 187 mg/dL — ABNORMAL HIGH (ref 70–99)
Glucose-Capillary: 216 mg/dL — ABNORMAL HIGH (ref 70–99)

## 2021-09-09 MED ORDER — INSULIN GLARGINE-YFGN 100 UNIT/ML ~~LOC~~ SOLN
15.0000 [IU] | Freq: Every day | SUBCUTANEOUS | Status: DC
  Administered 2021-09-09: 15 [IU] via SUBCUTANEOUS
  Filled 2021-09-09 (×2): qty 0.15

## 2021-09-09 MED ORDER — DARBEPOETIN ALFA 100 MCG/0.5ML IJ SOSY
100.0000 ug | PREFILLED_SYRINGE | INTRAMUSCULAR | Status: DC
Start: 2021-09-10 — End: 2021-09-29
  Administered 2021-09-11 – 2021-09-24 (×3): 100 ug via INTRAVENOUS
  Filled 2021-09-09 (×3): qty 0.5

## 2021-09-09 MED ORDER — CHLORHEXIDINE GLUCONATE CLOTH 2 % EX PADS
6.0000 | MEDICATED_PAD | Freq: Every day | CUTANEOUS | Status: DC
  Administered 2021-09-09 – 2021-09-12 (×3): 6 via TOPICAL

## 2021-09-09 NOTE — Progress Notes (Signed)
STROKE TEAM PROGRESS NOTE   INTERVAL HISTORY RN is at the bedside. Pt eyes open, following simple commands. Had VPS yesterday and tolerating well. No fever. Neuro stable. Still on vent due to copious secretions.   Vitals:   09/09/21 0743 09/09/21 0751 09/09/21 0800 09/09/21 1105  BP:   (!) 109/55   Pulse: 89 86 86 82  Resp:  20 (!) 21 17  Temp:   99.5 F (37.5 C) 99.4 F (37.4 C)  TempSrc:   Axillary Axillary  SpO2:  99% 98% 99%  Weight:      Height:       CBC:  Recent Labs  Lab 09/08/21 0255 09/09/21 0303  WBC 8.5 10.0  NEUTROABS  --  7.8*  HGB 9.7* 9.1*  HCT 30.2* 27.8*  MCV 76.3* 77.4*  PLT 206 973   Basic Metabolic Panel:  Recent Labs  Lab 09/07/21 0545 09/08/21 0255 09/09/21 0303  NA 131* 131* 130*  K 4.4 6.6* 4.8  CL 87* 88* 89*  CO2 '24 24 26  '$ GLUCOSE 110* 133* 235*  BUN 65* 70* 51*  CREATININE 5.95* 6.35* 4.92*  CALCIUM 9.9 10.3 9.3  MG 2.0  --   --   PHOS 2.8 4.9* 5.7*     IMAGING past 24 hours No results found.  PHYSICAL EXAM  Physical Exam  Constitutional: Appears well-developed and well-nourished middle-age African-American lady.  Cardiovascular: Normal rate and regular rhythm.  Respiratory: Effort normal, non-labored breathing, on trach via vent  Neuro - awake, alert, eyes open, nonverbal on vent via trach, but following most simple commands.  Left gaze preference, however able to cross midline with incomplete right gaze, blinking to visual threat bilaterally, PERRL.  Left facial droop. Tongue midline.  Left hemiplegia, RUE and RLE 3/5. Sensation not cooperative but right FTN no ataxia, gait not tested.     ASSESSMENT/PLAN [name] is a 56 y.o. female with history of ESRD on HD MWF, CHF, HTN, DM presenting with nausea, vomiting, left side weakness present upon waking. Intubated emergently at [facility] and transferred to [facility] on 5/59.  NSGY consulted, EVD placement 5/29. MRI shows stable size in right thalamic hemorrhage with  edema extending to the pons and midbrain. EVD still in place. Extubated 08/26/2021. Reintubated 08/27/2021 and then tracheostomy placed. Consult for PEG tube sent to trauma team.   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 Small volume intraventricular extension of hemorrhage. Mild hydrocephalus. 5/29 Head CT 2200- decrease in size of lateral and third ventricles s/p EVD placement  Repeat CT head 6/12 -1. No significant interval change in the right thalamic hemorrhage with associated mass effect and mild compression of the third  ventricle. 2. Slight interval increase in the size of the lateral ventricles compared to CT of 09/03/2021 which may represent mild hydrocephalus. CTA head & neck No arterial lesion underlying the right thalamic hematoma. Equivocal for spot sign which would be an adverse finding suggesting continued growth MRI  no change in size of the right thalamic intraparenchymal hemorrhage extending to midbrain, maximal dimension 3.6 cm by MRI. Surrounding edema, likely slightly more prominent ventricles are however similar in size to the CT of  08/23/2021. 2D Echo EF 60-65%, consistent with grade II diastolic dysfunction EEG - mild diffuse encephalopathy LDL 38 HgbA1c 6.2 VTE prophylaxis - SCDs aspirin 81 mg daily prior to admission, now on No antithrombotic.  Therapy recommendations:  CIR Disposition:  Pending  Obstructive Hydrocephalus NSGY on board 5/29- EVD placement  Follow up CT shows slightly decrease in size of lateral and third ventricle  EVD at 20 cm H2O Unable to tolerate clamping trial with nausea and vomiting.  S/p VP shunt 6/14  Cerebral edema hyponatremia On 3%  saline @ 75- d/c'd Given ESRD, off 3% saline Na 130 today  Acute Hypoxic Respiratory failure Intubated 5/29 CCM managing vent Extubated 6/1 Reintubated and s/p trach 6/2 Required being placed on ventilator 6/12  On Zosyn for HCAP Still copious secretion, continues to be on vent  Hypertensive emergency-resolved Home meds:  labetolol, hydralazine, losartan Stable BP less than 160 On losartan 100, labetalol 200 3 times daily, hydralazine 50 3 times daily,  amlodipine 10 Long-term BP goal normotensive cleviprex off  End Stage Renal Failure on hemodialysis MWF Nephrology consulted Emergent HD today for hyperkalemia Monitor BMP/electrolytes  Diabetes Mellitus A1c 6.2, controlled CBGs SSI Close PCP follow-up  Other Stroke Risk Factors Obesity, Body mass index is 33.7 kg/m., BMI >/= 30 associated with increased stroke risk, recommend weight loss, diet and exercise as appropriate  Congestive heart failure  Other Active Problems Hyperkalemia  K 6.1->4.2-> 3.5->6.6, emergent HD Dysphagia - cortrak placed on TF, speech on board  Hospital day # 17    This patient is critically ill due to respiratory distress, hydrocephalus s/p EVD and now VPS, ICH and IVH, ESRD on HD, hyperkalemia and at significant risk of neurological worsening, death form hydrocephalus, respiratory failure, brain herniation, seizure, sepsis. This patient's care requires constant monitoring of vital signs, hemodynamics, respiratory and cardiac monitoring, review of multiple databases, neurological assessment, discussion with family, other specialists and medical decision making of high complexity. I spent 35 minutes of neurocritical care time in the care of this patient.    [name], MD PhD Stroke Neurology 09/09/2021 12:24 PM      To contact Stroke Continuity provider, please refer to [email or web address] After hours, contact General Neurology

## 2021-09-09 NOTE — TOC CAGE-AID Note (Signed)
Transition of Care [facility]) - CAGE-AID Screening   Patient Details  Name: Janet Mitchell MRN: [id number] Date of Birth: 1965-04-19  Transition of Care [facility]) CM/SW Contact:    Nicodemus Denk C Tarpley-Carter, [place] Phone Number: 09/09/2021, 12:19 PM   Clinical Narrative: Pt is unable to participate in Cage Aid. Pt is nonverbal.  CSW will assess at a better time.  Anwar Sakata Tarpley-Carter, MSW, LCSW-A Pronouns:  She/Her/Hers [facility] HealthTransitions of Care Clinical Social Worker Direct Number:  [phone] [email or web address]  CAGE-AID Screening: Substance Abuse Screening unable to be completed due to: : Patient unable to participate

## 2021-09-09 NOTE — Progress Notes (Signed)
1 Day Post-Op  Subjective: CC: Seen in room.   Objective: Vital signs in last 24 hours: Temp:  [98.1 F (36.7 C)-100.5 F (38.1 C)] 99.5 F (37.5 C) (06/15 0800) Pulse Rate:  [73-89] 82 (06/15 1105) Resp:  [13-25] 17 (06/15 1105) BP: (79-165)/(44-102) 109/55 (06/15 0800) SpO2:  [93 %-100 %] 99 % (06/15 1105) FiO2 (%):  [40 %] 40 % (06/15 1105) Weight:  [97.6 kg] 97.6 kg (06/14 1359) Last BM Date : 09/09/21  Intake/Output from previous day: 06/14 0701 - 06/15 0700 In: 1683 [I.V.:1040; NG/GT:600; IV Piggyback:43] Out: 1439 [Drains:64; Stool:75; Blood:100] Intake/Output this shift: Total I/O In: 50 [NG/GT:50] Out: -   PE: Gen:  Alert, on vent Abd: Soft, ND, NT +BS, incisions with glue intact appears well and are without drainage, bleeding, or signs of infection  Lab Results:  Recent Labs    09/08/21 0255 09/09/21 0303  WBC 8.5 10.0  HGB 9.7* 9.1*  HCT 30.2* 27.8*  PLT 206 200   BMET Recent Labs    09/08/21 0255 09/09/21 0303  NA 131* 130*  K 6.6* 4.8  CL 88* 89*  CO2 24 26  GLUCOSE 133* 235*  BUN 70* 51*  CREATININE 6.35* 4.92*  CALCIUM 10.3 9.3   PT/INR No results for input(s): "LABPROT", "INR" in the last 72 hours. CMP     Component Value Date/Time   NA 130 (L) 09/09/2021 0303   K 4.8 09/09/2021 0303   CL 89 (L) 09/09/2021 0303   CO2 26 09/09/2021 0303   GLUCOSE 235 (H) 09/09/2021 0303   BUN 51 (H) 09/09/2021 0303   CREATININE 4.92 (H) 09/09/2021 0303   CREATININE 5.02 (H) 08/29/2019 1506   CALCIUM 9.3 09/09/2021 0303   PROT 7.5 08/23/2021 0632   ALBUMIN 2.6 (L) 09/09/2021 0303   AST 19 08/23/2021 0632   ALT 15 08/23/2021 0632   ALKPHOS 93 08/23/2021 0632   BILITOT 0.1 (L) 08/23/2021 0632   GFRNONAA 10 (L) 09/09/2021 0303   GFRNONAA 9 (L) 08/29/2019 1506   GFRAA 11 (L) 08/29/2019 1506   Lipase     Component Value Date/Time   LIPASE 19 12/18/2018 0037    Studies/Results: No results found.  Anti-infectives: Anti-infectives  (From admission, onward)    Start     Dose/Rate Route Frequency Ordered Stop   09/09/21 1800  ceFEPIme (MAXIPIME) 1 g in sodium chloride 0.9 % 100 mL IVPB        1 g 200 mL/hr over 30 Minutes Intravenous Every 24 hours 09/08/21 1116 09/15/21 1759   09/08/21 1330  ceFEPIme (MAXIPIME) 1 g in sodium chloride 0.9 % 100 mL IVPB        1 g 200 mL/hr over 30 Minutes Intravenous NOW 09/08/21 1234 09/08/21 1352   09/08/21 1215  cefTRIAXone (ROCEPHIN) 1 g in sodium chloride 0.9 % 100 mL IVPB  Status:  Discontinued        1 g 200 mL/hr over 30 Minutes Intravenous  Once 09/08/21 1116 09/08/21 1234   09/08/21 1200  vancomycin (VANCOCIN) IVPB 1000 mg/200 mL premix  Status:  Discontinued        1,000 mg 200 mL/hr over 60 Minutes Intravenous Every M-W-F (Hemodialysis) 09/06/21 1518 09/07/21 0741   09/08/21 0730  ceFAZolin (ANCEF) IVPB 2g/100 mL premix        2 g 200 mL/hr over 30 Minutes Intravenous On call to O.R. 09/08/21 6160 09/08/21 0848   09/06/21 2200  vancomycin (VANCOCIN) IVPB 1000 mg/200  mL premix  Status:  Discontinued        1,000 mg 200 mL/hr over 60 Minutes Intravenous  Once 09/06/21 1518 09/06/21 2156   09/06/21 2000  piperacillin-tazobactam (ZOSYN) IVPB 2.25 g  Status:  Discontinued        2.25 g 100 mL/hr over 30 Minutes Intravenous Every 8 hours 09/06/21 1518 09/08/21 1116   09/06/21 1115  piperacillin-tazobactam (ZOSYN) IVPB 3.375 g        3.375 g 100 mL/hr over 30 Minutes Intravenous  Once 09/06/21 1023 09/06/21 1132   09/06/21 1115  vancomycin (VANCOREADY) IVPB 2000 mg/400 mL        2,000 mg 200 mL/hr over 120 Minutes Intravenous  Once 09/06/21 1024 09/06/21 1342   08/23/21 1230  piperacillin-tazobactam (ZOSYN) IVPB 2.25 g        2.25 g 100 mL/hr over 30 Minutes Intravenous Every 8 hours 08/23/21 1141 08/28/21 0523        Assessment/Plan Hx Right Thalamus ICH with IVH and Obstructive Hydrocephalus POD 1 s/p dx laparoscopy, lap assistance for VP shunt placement by Dr.  Redmond Pulling  - Dr. Redmond Pulling consulted intra-op by Dr. Marcello Moores of [place] for assistance of VP shunt yesterday.  - Incisions clean. Abd soft.  - Will discuss w/ MD if we will cont to follow  ARF on vent - per CCM Hx of HTN, CHF, DM and ESRD on HD MWF    LOS: 17 days    Jillyn Ledger , [facility] 09/09/2021, 11:17 AM Please see Amion for pager number during day hours 7:00am-4:30pm

## 2021-09-09 NOTE — Progress Notes (Signed)
[facility] Progress Note  Subjective: seen in room  Vitals:   09/09/21 0700 09/09/21 0743 09/09/21 0751 09/09/21 0800  BP:    (!) 109/55  Pulse: 88 89 86 86  Resp: (!) 22  20 (!) 21  Temp:    99.5 F (37.5 C)  TempSrc:    Axillary  SpO2: 99%  99% 98%  Weight:      Height: '5\' 7"'$  (1.702 m)       Exam:  No distress, nods head to my voice, lethargic  Trach in place  no jvd  Chest cta bilat  Cor reg no RG  Abd soft ntnd no ascites   Ext no LE edema   Neuro - as above    LUA AVF +bruit  Home meds include - asa, tylenol, renavite, cadexomer iodine, phoslo, ceterizine, cholestyramine, hydralazine 10 bid, labetalol 200 bid, losartan 100, psyllium     OP HD: [facility] MWF  4h  97.5kg  450/500  2/2.5 bath  15ga  AVF  Hep none - mircera 100 ug q2 - rocaltrol 1.0 mcg po tiw   Assessment/ Plan Right thalamic intracranial hemorrhage - w/ intraventricular hemorrhage and hydrocephalus. SP ventriculostomy drain placement, then VP shunt surgery was done on 6/14. Mentation improving overall.  ESRD - is on MWF HD schedule. HD yest w/o incident. HD tomorrow.   Hyperkalemia - resolved Volume/ HTN - down to dry wt after extra HD last week. UF 2 L q HD as tolerated for now. BP's wnl, off all IV and po meds.   Anemia - Hb 9-10 range, will resume esa here w/ darbe 100 ug weekly on Wed.  CKD-MBD - On calcitriol for PTH suppression, currently not on binder while getting tube feeds. Phos in range.  Nutrition - On tube feeds with ongoing nutritional supplementation.  Resp failure/ sp trach - off vent, on trach collar Atrial fib - a/c on hold, off BB, in NSR    Rob Jonnie Finner, MD 09/09/2021, 10:10 AM  Recent Labs  Lab 09/08/21 0255 09/09/21 0303  HGB 9.7* 9.1*  ALBUMIN 2.9* 2.6*  CALCIUM 10.3 9.3  PHOS 4.9* 5.7*  CREATININE 6.35* 4.92*  K 6.6* 4.8    Inpatient medications:  amantadine  200 mg Per Tube Weekly   calcitRIOL  1 mcg Per Tube Q M,W,F   chlorhexidine   15 mL Mouth Rinse BID   Chlorhexidine Gluconate Cloth  6 each Topical Q0600   Chlorhexidine Gluconate Cloth  6 each Topical Q0600   darbepoetin (ARANESP) injection - DIALYSIS  100 mcg Intravenous Q Wed-HD   feeding supplement (PROSource TF)  45 mL Per Tube TID   fiber  1 packet Per Tube BID   heparin injection (subcutaneous)  5,000 Units Subcutaneous Q8H   insulin aspart  0-9 Units Subcutaneous Q4H   insulin aspart  6 Units Subcutaneous Q4H   insulin glargine-yfgn  15 Units Subcutaneous Daily   mouth rinse  15 mL Mouth Rinse q12n4p   multivitamin  1 tablet Per Tube QHS   pantoprazole sodium  40 mg Per Tube Daily    sodium chloride 0 mL/hr at 09/06/21 1141   ceFEPime (MAXIPIME) IV     feeding supplement (OSMOLITE 1.5 CAL) 50 mL/hr at 09/09/21 0800   acetaminophen **OR** acetaminophen (TYLENOL) oral liquid 160 mg/5 mL **OR** acetaminophen, diphenhydrAMINE, fentaNYL (SUBLIMAZE) injection, Gerhardt's butt cream, ipratropium-albuterol, labetalol, ondansetron (ZOFRAN) IV, polyvinyl alcohol, senna-docusate     Rob Norberto Wishon 09/09/2021, 10:12 AM   Recent Labs  Lab 09/08/21 0255 09/09/21 0303  HGB 9.7* 9.1*  ALBUMIN 2.9* 2.6*  CALCIUM 10.3 9.3  PHOS 4.9* 5.7*  CREATININE 6.35* 4.92*  K 6.6* 4.8   Inpatient medications:  amantadine  200 mg Per Tube Weekly   calcitRIOL  1 mcg Per Tube Q M,W,F   chlorhexidine  15 mL Mouth Rinse BID   Chlorhexidine Gluconate Cloth  6 each Topical Q0600   Chlorhexidine Gluconate Cloth  6 each Topical Q0600   darbepoetin (ARANESP) injection - DIALYSIS  100 mcg Intravenous Q Wed-HD   feeding supplement (PROSource TF)  45 mL Per Tube TID   fiber  1 packet Per Tube BID   heparin injection (subcutaneous)  5,000 Units Subcutaneous Q8H   insulin aspart  0-9 Units Subcutaneous Q4H   insulin aspart  6 Units Subcutaneous Q4H   insulin glargine-yfgn  15 Units Subcutaneous Daily   mouth rinse  15 mL Mouth Rinse q12n4p   multivitamin  1 tablet Per Tube QHS    pantoprazole sodium  40 mg Per Tube Daily    sodium chloride 0 mL/hr at 09/06/21 1141   ceFEPime (MAXIPIME) IV     feeding supplement (OSMOLITE 1.5 CAL) 50 mL/hr at 09/09/21 0800   acetaminophen **OR** acetaminophen (TYLENOL) oral liquid 160 mg/5 mL **OR** acetaminophen, diphenhydrAMINE, fentaNYL (SUBLIMAZE) injection, Gerhardt's butt cream, ipratropium-albuterol, labetalol, ondansetron (ZOFRAN) IV, polyvinyl alcohol, senna-docusate

## 2021-09-09 NOTE — Progress Notes (Signed)
Subjective: The patient is alert and attentive.  She is in no apparent distress.  Objective: Vital signs in last 24 hours: Temp:  [98.1 F (36.7 C)-100.5 F (38.1 C)] 100.5 F (38.1 C) (06/15 0400) Pulse Rate:  [73-89] 89 (06/15 0743) Resp:  [11-25] 22 (06/15 0700) BP: (79-165)/(44-104) 135/73 (06/15 0600) SpO2:  [93 %-100 %] 99 % (06/15 0700) FiO2 (%):  [40 %] 40 % (06/15 0416) Weight:  [97.6 kg-98.7 kg] 97.6 kg (06/14 1359) Estimated body mass index is 33.7 kg/m as calculated from the following:   Height as of this encounter: '5\' 7"'$  (1.702 m).   Weight as of this encounter: 97.6 kg.   Intake/Output from previous day: 06/14 0701 - 06/15 0700 In: 1683 [I.V.:1040; NG/GT:600; IV Piggyback:43] Out: 1610 [Drains:64; Stool:75; Blood:100] Intake/Output this shift: No intake/output data recorded.  Physical exam the patient is alert.  She follows commands.  Her dressings are clean and dry.  Her abdomen is soft.  Lab Results: Recent Labs    09/08/21 0255 09/09/21 0303  WBC 8.5 10.0  HGB 9.7* 9.1*  HCT 30.2* 27.8*  PLT 206 200   BMET Recent Labs    09/08/21 0255 09/09/21 0303  NA 131* 130*  K 6.6* 4.8  CL 88* 89*  CO2 24 26  GLUCOSE 133* 235*  BUN 70* 51*  CREATININE 6.35* 4.92*  CALCIUM 10.3 9.3    Studies/Results: No results found.  Assessment/Plan: Status post VP shunt: The patient seems to be doing well and by her nurses report is at her baseline.  LOS: 17 days     Janet Mitchell 09/09/2021, 7:48 AM     Patient ID: Janet Mitchell, female   DOB: [birth year], 56 y.o.   MRN: [id number]

## 2021-09-09 NOTE — Telephone Encounter (Signed)
Pts husband called to cancel pts appt on 6.20.23 because pt is in [place] due to having a stroke.  It looks like she is scheduled for surgery as well at end of June.

## 2021-09-09 NOTE — Progress Notes (Signed)
NAME:  Janet Mitchell, MRN:  [id number], DOB:  01/08/1966, LOS: 17 ADMISSION DATE:  08/23/2021, CONSULTATION DATE:  08/23/2021 REFERRING MD:  Dr. Rory Percy - Neuro CHIEF COMPLAINT: Code Stroke  History of Present Illness:  56 year old woman who presented to [facility] 5/29 with reported left-sided weakness, slurred speech, nausea/vomiting. LKN 6 hours prior to arrival. PMHx significant for HTN, CHF (Echo 07/2021 EF 60-65%, severe LVH, G2DD), T2DM (c/b diabetic neuropathy with subsequent L great toe amputation 2/2 ulcer/infection), ESRD (on HD MWF via [facility]IJ TDC).  On arrival to ED, patient is somnolent with slight disconjugate gaze with near flaccid left upper arm and left leg weakness. BP 222/107. CT Head with 5cc acute hematoma in the right thalamus with intraventricular extension. Neurology consulted and patient transferred to [facility] for [facility] evaluation. PCCM consulted for admission.  On arrival to unit patient is intubated, pupils 80m bilaterally and non-reactive. Withdrawals in all extremities, does not follow commands, on Propofol and Cleviprex gtt.    Pertinent Medical History:  ESRD with HD MWF, CHF, HTN, DM, s/p left great toe amputation secondary to ulcer/infection   Significant Hospital Events: Including procedures, antibiotic start and stop dates in addition to other pertinent events   [facility] HD, more awake, following commands with RUE/RLE 6/1 No issues overnight, she remains alert and interactive on vent.  Extubated 6/2 failed extubation requiring reintubation. Trached. 6/4 Placed on ATC yesterday afternoon and has tolerated well since 6/5: EVD raised to 20 cm H20 6/7: Failed EVD clamping trial after developing emesis and headache 6/8: Failed EVD clamping due to emesis 6/9: Increase EVD pressure to 25 6/12 back on vent due to hypoxia from mucus plugging, started back on antibiotics 6/14 VP shunt placed. Antibiotics  adjusted based on cultures   Interim History / Subjective:  This morning she denies complaints. Tmax 100.5 this morning.  Objective:  Blood pressure 135/73, pulse 86, temperature (!) 100.5 F (38.1 C), temperature source Axillary, resp. rate 20, height '5\' 7"'$  (1.702 m), weight 97.6 kg, SpO2 99 %.    Vent Mode: PSV;CPAP FiO2 (%):  [40 %] 40 % Set Rate:  [16 bmp] 16 bmp Vt Set:  [490 mL] 490 mL PEEP:  [5 cmH20] 5 cmH20 Pressure Support:  [8 cmH20-10 cmH20] 8 cmH20 Plateau Pressure:  [13 cmH20-19 cmH20] 16 cmH20   Intake/Output Summary (Last 24 hours) at 09/09/2021 0758 Last data filed at 09/09/2021 0700 Gross per 24 hour  Intake 1682.97 ml  Output 1439 ml  Net 243.97 ml    Filed Weights   09/08/21 0500 09/08/21 0958 09/08/21 1359  Weight: 97.2 kg 98.7 kg 97.6 kg   Physical Examination:  General: critically ill appearing woman lying in bed in NAD, not sedated on MV HEENT: dressing over former EVD site- c/d/i Neck: trach in place, sutured Neuro: awake, alert, moving R side easily, hemiparetic on the left.   CV:  S1S2, RRR PULM: rhonchi, breathing comfortably on MV GI: soft, NT Extremities: no significant edema, no cyanosis Skin: warm, dry, no rashes  Na+  130 K+ 4.8 BUN 51 Cr 4.92 WBC 10 H/H 9.1/27.8 Platelets 200 Resp culture> staph aureus, rare enterobacter aerogens- R: zosyn, S cefepime Blood culture> NGTD BG 130-160s  Assessment & Plan:   Intracranial hemorrhage- Large right thalamic bleed Acquired obstructive hydrocephalus due to thalamic bleed MRI shows stable bleed and ventricular size, small increase in surrounding edema compared to admission CT.  -  Con't supportive care -VP shunt in place --[place] heparin today.  --Con't cortrak for nutrition. Con't working with SLP-- can go back to using PMV when off the vent.   Hypertensive emergency, resolved -Remains off antihypertensives the last few days. Labetalol PRN.  ESRD on dialysis hyperkalemia due to  end-stage renal disease -HD per nephrology -renally dose meds, avoid nephrotoxic meds -strict I/O  Hyponatremia, likely due to end-stage renal disease -HD per nephrology  Chronic anemia due to ESRD -transfuse for Hb <7 or hemodynamically significantly bleeding  Hyperglycemia, better controlled --Con't TF coverage 6 units q4h with hold parameters -adding insulin glargine 15 units daily -goal BG 140-180 -SSI PRN  Acute respiratory failure with hypoxia requiring vent, trach for prolonged vent weaning Staph aureus and enterobacter pneumonia; concern for septic shock-- shock resolved -LTVV, vent weaning -antibiotics x 7 days, start day 6/14 for enterobacter due to resistance -can remove trach sutures again when off the vent-- placed in OR due to leak around trach   Best Practice: (right click and "Reselect all SmartList Selections" daily)   Diet/type: tubefeeds DVT prophylaxis: Heparin tid GI prophylaxis: PPI Lines: N/A Foley:  N/A Code Status:  full code Last date of multidisciplinary goals of care discussion [6/14]  This patient is critically ill with multiple organ system failure which requires frequent high complexity decision making, assessment, support, evaluation, and titration of therapies. This was completed through the application of advanced monitoring technologies and extensive interpretation of multiple databases. During this encounter critical care time was devoted to patient care services described in this note for 38 minutes.    Julian Hy, DO 09/09/21 8:10 AM [facility]

## 2021-09-10 DIAGNOSIS — I629 Nontraumatic intracranial hemorrhage, unspecified: Secondary | ICD-10-CM | POA: Diagnosis not present

## 2021-09-10 DIAGNOSIS — Z9911 Dependence on respirator [ventilator] status: Secondary | ICD-10-CM | POA: Diagnosis not present

## 2021-09-10 DIAGNOSIS — N186 End stage renal disease: Secondary | ICD-10-CM | POA: Diagnosis not present

## 2021-09-10 DIAGNOSIS — G911 Obstructive hydrocephalus: Secondary | ICD-10-CM | POA: Diagnosis not present

## 2021-09-10 DIAGNOSIS — J9601 Acute respiratory failure with hypoxia: Secondary | ICD-10-CM | POA: Diagnosis not present

## 2021-09-10 DIAGNOSIS — Z930 Tracheostomy status: Secondary | ICD-10-CM | POA: Diagnosis not present

## 2021-09-10 DIAGNOSIS — I161 Hypertensive emergency: Secondary | ICD-10-CM | POA: Diagnosis not present

## 2021-09-10 LAB — CBC WITH DIFFERENTIAL/PLATELET
Abs Immature Granulocytes: 0.34 10*3/uL — ABNORMAL HIGH (ref 0.00–0.07)
Basophils Absolute: 0.1 10*3/uL (ref 0.0–0.1)
Basophils Relative: 1 %
Eosinophils Absolute: 0.4 10*3/uL (ref 0.0–0.5)
Eosinophils Relative: 4 %
HCT: 27 % — ABNORMAL LOW (ref 36.0–46.0)
Hemoglobin: 8.8 g/dL — ABNORMAL LOW (ref 12.0–15.0)
Immature Granulocytes: 4 %
Lymphocytes Relative: 21 %
Lymphs Abs: 1.8 10*3/uL (ref 0.7–4.0)
MCH: 24.9 pg — ABNORMAL LOW (ref 26.0–34.0)
MCHC: 32.6 g/dL (ref 30.0–36.0)
MCV: 76.5 fL — ABNORMAL LOW (ref 80.0–100.0)
Monocytes Absolute: 0.8 10*3/uL (ref 0.1–1.0)
Monocytes Relative: 9 %
Neutro Abs: 5.3 10*3/uL (ref 1.7–7.7)
Neutrophils Relative %: 61 %
Platelets: 146 10*3/uL — ABNORMAL LOW (ref 150–400)
RBC: 3.53 MIL/uL — ABNORMAL LOW (ref 3.87–5.11)
RDW: 16.2 % — ABNORMAL HIGH (ref 11.5–15.5)
WBC: 8.6 10*3/uL (ref 4.0–10.5)
nRBC: 0 % (ref 0.0–0.2)

## 2021-09-10 LAB — GLUCOSE, CAPILLARY
Glucose-Capillary: 101 mg/dL — ABNORMAL HIGH (ref 70–99)
Glucose-Capillary: 109 mg/dL — ABNORMAL HIGH (ref 70–99)
Glucose-Capillary: 118 mg/dL — ABNORMAL HIGH (ref 70–99)
Glucose-Capillary: 126 mg/dL — ABNORMAL HIGH (ref 70–99)
Glucose-Capillary: 136 mg/dL — ABNORMAL HIGH (ref 70–99)
Glucose-Capillary: 219 mg/dL — ABNORMAL HIGH (ref 70–99)
Glucose-Capillary: 254 mg/dL — ABNORMAL HIGH (ref 70–99)

## 2021-09-10 LAB — RENAL FUNCTION PANEL
Albumin: 2.9 g/dL — ABNORMAL LOW (ref 3.5–5.0)
Anion gap: 20 — ABNORMAL HIGH (ref 5–15)
BUN: 76 mg/dL — ABNORMAL HIGH (ref 6–20)
CO2: 23 mmol/L (ref 22–32)
Calcium: 9.8 mg/dL (ref 8.9–10.3)
Chloride: 92 mmol/L — ABNORMAL LOW (ref 98–111)
Creatinine, Ser: 6.93 mg/dL — ABNORMAL HIGH (ref 0.44–1.00)
GFR, Estimated: 7 mL/min — ABNORMAL LOW (ref >60)
Glucose, Bld: 215 mg/dL — ABNORMAL HIGH (ref 70–99)
Phosphorus: 6.6 mg/dL — ABNORMAL HIGH (ref 2.5–4.6)
Potassium: 5.7 mmol/L — ABNORMAL HIGH (ref 3.5–5.1)
Sodium: 135 mmol/L (ref 135–145)

## 2021-09-10 MED ORDER — AMLODIPINE BESYLATE 10 MG PO TABS
10.0000 mg | ORAL_TABLET | Freq: Every day | ORAL | Status: DC
  Administered 2021-09-10 – 2021-09-23 (×11): 10 mg
  Filled 2021-09-10 (×12): qty 1

## 2021-09-10 MED ORDER — INSULIN GLARGINE-YFGN 100 UNIT/ML ~~LOC~~ SOLN
15.0000 [IU] | Freq: Two times a day (BID) | SUBCUTANEOUS | Status: DC
  Administered 2021-09-10 – 2021-09-15 (×11): 15 [IU] via SUBCUTANEOUS
  Filled 2021-09-10 (×18): qty 0.15

## 2021-09-10 MED ORDER — LOSARTAN POTASSIUM 50 MG PO TABS
25.0000 mg | ORAL_TABLET | Freq: Every day | ORAL | Status: DC
Start: 2021-09-10 — End: 2021-09-13
  Administered 2021-09-10 – 2021-09-12 (×3): 25 mg
  Filled 2021-09-10 (×4): qty 1

## 2021-09-10 MED ORDER — MIDAZOLAM HCL 2 MG/2ML IJ SOLN
2.0000 mg | Freq: Once | INTRAMUSCULAR | Status: AC
Start: 2021-09-10 — End: 2021-09-10
  Administered 2021-09-10: 2 mg via INTRAVENOUS
  Filled 2021-09-10: qty 2

## 2021-09-10 MED ORDER — INSULIN ASPART 100 UNIT/ML IJ SOLN
3.0000 [IU] | INTRAMUSCULAR | Status: DC
  Administered 2021-09-10 – 2021-09-11 (×3): 3 [IU] via SUBCUTANEOUS
  Administered 2021-09-11: 9 [IU] via SUBCUTANEOUS
  Administered 2021-09-11 (×2): 6 [IU] via SUBCUTANEOUS
  Administered 2021-09-12 (×2): 3 [IU] via SUBCUTANEOUS

## 2021-09-10 NOTE — Progress Notes (Signed)
Physical Therapy Treatment Patient Details Name: Janet Mitchell MRN: [id number] DOB: 12/06/1965 Today's Date: 09/10/2021   History of Present Illness 56 y.o. female presents to [facility] hospital as a transfer from [place] on 08/23/2021 with complaints of nausea, vomiting, L weakness. Pt required emergent intubation during transfer from [facility]. CTH reveals R thalamic hemorrhage with IVH. Pt underwent R frontal ventriculostomy on 5/29. Extubated 6/1, reintubated 6/2, tracheostomy 6/2. Pt failed EVD clamping and VP shunt placed 6/14. PMH includes HTN, CHF, MDII, ESRD.    PT Comments    Pt remains profoundly weak, with poor sitting balance and limited to no initiation of functional mobility. Pt is able to mover RUE with ankle movement noted in RLE, remains without purposeful movement of L side, only with increased tone and reflexive movement to touch. Pt requires max-totalA for all mobility and balance. Pt has demonstrated little progress to this point with acute therapies. PT reducing frequency at this time.   Recommendations for follow up therapy are one component of a multi-disciplinary discharge planning process, led by the attending physician.  Recommendations may be updated based on patient status, additional functional criteria and insurance authorization.  Follow Up Recommendations  Skilled nursing-short term rehab (<3 hours/day)     Assistance Recommended at Discharge Frequent or constant Supervision/Assistance  Patient can return home with the following Two people to help with walking and/or transfers;Two people to help with bathing/dressing/bathroom;Assistance with cooking/housework;Assistance with feeding;Direct supervision/assist for medications management;Direct supervision/assist for financial management;Assist for transportation;Help with stairs or ramp for entrance   Equipment Recommendations  Hospital bed;Other (comment);Wheelchair (measurements PT) (hoyer lift)     Recommendations for Other Services       Precautions / Restrictions Precautions Precautions: Fall;Other (comment) Precaution Comments: trach, new VP shunt, cortrack, flexiseal Restrictions Weight Bearing Restrictions: No     Mobility  Bed Mobility Overal bed mobility: Needs Assistance Bed Mobility: Supine to Sit, Sit to Supine     Supine to sit: Total assist, +2 for physical assistance, HOB elevated Sit to supine: Total assist, +2 for physical assistance, HOB elevated   General bed mobility comments: HOB elevated, pt requires totalA with minimal strength noted in core    Transfers                        Ambulation/Gait                   Stairs             Wheelchair Mobility    Modified Rankin (Stroke Patients Only)       Balance Overall balance assessment: Needs assistance Sitting-balance support: No upper extremity supported, Feet unsupported Sitting balance-Leahy Scale: Zero Sitting balance - Comments: totalA, minimal to no effort noted by pt for postural righting reactions, totalA Postural control: Posterior lean                                  Cognition Arousal/Alertness: Awake/alert Behavior During Therapy: Flat affect Overall Cognitive Status: Difficult to assess                                 General Comments: pt awakens with stimulation, nods appropirately to questions. Pt with slowed processing, following simple one-step commands with increased time        Exercises General Exercises -  Upper Extremity Shoulder Flexion: PROM, Left, 10 reps Shoulder ABduction: PROM, Left, 10 reps Elbow Flexion: PROM, Left, 10 reps Elbow Extension: PROM, Left, 10 reps General Exercises - Lower Extremity Ankle Circles/Pumps: PROM, Left, 5 reps, AROM, Right, 20 reps Heel Slides: PROM, Left, 10 reps    General Comments General comments (skin integrity, edema, etc.): VSS, pt on 8L 35% FiO2 trach collar       Pertinent Vitals/Pain Pain Assessment Pain Assessment: Faces Faces Pain Scale: No hurt    Home Living                          Prior Function            PT Goals (current goals can now be found in the care plan section) Acute Rehab PT Goals Patient Stated Goal: not stated PT Goal Formulation: With patient Time For Goal Achievement: 09/24/21 Potential to Achieve Goals: Poor Progress towards PT goals: Not progressing toward goals - comment (remains max-totalA for mobility)    Frequency    Min 2X/week      PT Plan Current plan remains appropriate    Co-evaluation              AM-PAC PT "6 Clicks" Mobility   Outcome Measure  Help needed turning from your back to your side while in a flat bed without using bedrails?: Total Help needed moving from lying on your back to sitting on the side of a flat bed without using bedrails?: Total Help needed moving to and from a bed to a chair (including a wheelchair)?: Total Help needed standing up from a chair using your arms (e.g., wheelchair or bedside chair)?: Total Help needed to walk in hospital room?: Total Help needed climbing 3-5 steps with a railing? : Total 6 Click Score: 6    End of Session Equipment Utilized During Treatment: Oxygen Activity Tolerance: Patient tolerated treatment well Patient left: in bed;with call bell/phone within reach;with bed alarm set;with nursing/sitter in room Nurse Communication: Mobility status;Need for lift equipment PT Visit Diagnosis: Other abnormalities of gait and mobility (R26.89);Other symptoms and signs involving the nervous system (R29.898);Muscle weakness (generalized) (M62.81);Hemiplegia and hemiparesis Hemiplegia - Right/Left: Left Hemiplegia - caused by: Nontraumatic intracerebral hemorrhage     Time: 8676-7209 PT Time Calculation (min) (ACUTE ONLY): 24 min  Charges:  $Therapeutic Exercise: 8-22 mins $Therapeutic Activity: 8-22 mins                      Zenaida Niece, PT, DPT Acute Rehabilitation Office [place] 09/10/2021, 5:11 PM

## 2021-09-10 NOTE — Progress Notes (Signed)
SLP Cancellation Note  Patient Details Name: Dessiree Sze MRN: [id number] DOB: [birth year]   Cancelled treatment:       Reason Eval/Treat Not Completed: Medical issues which prohibited therapy. Pt has still been on the vent with copious secretions per chart. Will continue to follow and await her ability to resume TC.    Osie Bond., M.A. [place] Office [phone]  09/10/2021, 7:45 AM

## 2021-09-10 NOTE — Progress Notes (Signed)
[facility] Progress Note Patient Name: Janet Mitchell DOB: 03-14-1966 MRN: [id number]   Date of Service  09/10/2021  HPI/Events of Note  She is losing tidal volume despite sedation.  eICU Interventions  PCCM ground crew requested to assess her tracheostomy.        Kerry Kass Jaylissa Felty 09/10/2021, 1:37 AM

## 2021-09-10 NOTE — TOC Progression Note (Signed)
Transition of Care [facility]) - Progression Note    Patient Details  Name: Janet Mitchell MRN: [id number] Date of Birth: 14-Jun-1965  Transition of Care [facility]) CM/SW Contact  Ella Bodo, RN Phone Number: 09/10/2021, 5:16 PM  Clinical Narrative:    Patient discussed in multidisciplinary progression rounds with regards to disposition.  MD in favor of possible LTAC referral to assist with vent weaning.  Referrals made to both area [facility] to check for eligibility.     Expected Discharge Plan: Long Term Acute Care (LTAC) Barriers to Discharge: Insurance Authorization, Continued Medical Work up  Expected Discharge Plan and Services Expected Discharge Plan: [place] (LTAC) In-house Referral: Clinical Social Work     Living arrangements for the past 2 months: Single Family Home                                       Social Determinants of Health (SDOH) Interventions    Readmission Risk Interventions    07/19/2019    1:50 PM 07/12/2019    4:57 PM 04/05/2019    2:18 PM  Readmission Risk Prevention Plan  Transportation Screening Complete Complete Complete  Medication Review (RN Care Manager) Complete  Complete  PCP or Specialist appointment within 3-5 days of discharge Patient refused    [place] or [place] Complete    Palliative Care Screening Not Applicable  [place] Not Applicable Not Applicable Patient Refused   Reinaldo Raddle, RN, BSN  Trauma/Neuro ICU Case Manager [phone]

## 2021-09-10 NOTE — Progress Notes (Signed)
NAME:  Janet Mitchell, MRN:  [id number], DOB:  September 06, 1965, LOS: 18 ADMISSION DATE:  08/23/2021, CONSULTATION DATE:  08/23/2021 REFERRING MD:  Dr. Rory Percy - Neuro CHIEF COMPLAINT: Code Stroke  History of Present Illness:  56 year old woman who presented to [facility] 5/29 with reported left-sided weakness, slurred speech, nausea/vomiting. LKN 6 hours prior to arrival. PMHx significant for HTN, CHF (Echo 07/2021 EF 60-65%, severe LVH, G2DD), T2DM (c/b diabetic neuropathy with subsequent L great toe amputation 2/2 ulcer/infection), ESRD (on HD MWF via [facility] TDC).  On arrival to ED, patient is somnolent with slight disconjugate gaze with near flaccid left upper arm and left leg weakness. BP 222/107. CT Head with 5cc acute hematoma in the right thalamus with intraventricular extension. Neurology consulted and patient transferred to [facility] for further evaluation. PCCM consulted for admission.  On arrival to unit patient is intubated, pupils 17m bilaterally and non-reactive. Withdrawals in all extremities, does not follow commands, on Propofol and Cleviprex gtt.    Pertinent Medical History:  ESRD with HD MWF, CHF, HTN, DM, s/p left great toe amputation secondary to ulcer/infection   Significant Hospital Events: Including procedures, antibiotic start and stop dates in addition to other pertinent events   [facility]28 [facility]. [facility] > [facility] > HD, more awake, following commands with RUE/RLE 6/1 No issues overnight, she remains alert and interactive on vent.  Extubated 6/2 failed extubation requiring reintubation. Trached. 6/4 Placed on ATC yesterday afternoon and has tolerated well since 6/5: EVD raised to 20 cm H20 6/7: Failed EVD clamping trial after developing emesis and headache 6/8: Failed EVD clamping due to emesis 6/9: Increase EVD pressure to 25 6/12 back on vent due to hypoxia from mucus plugging, started back on antibiotics 6/14 VP shunt placed. Antibiotics  adjusted based on cultures   Interim History / Subjective:  Constant vent alarming, concern for trach balloon incompetence.  Tmax 99.6.  Objective:  Blood pressure 134/76, pulse 87, temperature 99.3 F (37.4 C), temperature source Axillary, resp. rate (!) 25, height '5\' 7"'$  (1.702 m), weight 97.6 kg, SpO2 98 %.    Vent Mode: PRVC FiO2 (%):  [40 %] 40 % Set Rate:  [16 bmp] 16 bmp Vt Set:  [490 mL] 490 mL PEEP:  [5 cmH20] 5 cmH20 Pressure Support:  [5 cmH20] 5 cmH20 Plateau Pressure:  [12 cmH20] 12 cmH20   Intake/Output Summary (Last 24 hours) at 09/10/2021 0754 Last data filed at 09/10/2021 0600 Gross per 24 hour  Intake 600 ml  Output --  Net 600 ml    Filed Weights   09/08/21 0500 09/08/21 0958 09/08/21 1359  Weight: 97.2 kg 98.7 kg 97.6 kg   Physical Examination:  General: critically ill appearing woman lying in bed in NAD HEENT: dressing over EVD site, otherwise normocephalic. Neck: trach- no erythema. Sutures removed before trach exchange. Neuro: awake, alert, nodding to answer questions. Moves R side briskly but hemiparetic on the left.    CV: S1S2, RRR PULM: thick secretions, somewhat bloody. Rhonchi, but strong cough clears secretions.  GI: soft, NT Extremities: No peripheral edema, no cyanosis. Surgically absent first toe on the left. Skin: Warm, dry, no diffuse rashes.  Na+  135 K+ 5.7 BUN 76 Cr 6.93 WBC 8.6 H/H 8.8/27.0 Platelets 146 Blood culture 6/12> NGTD BG 200s  Assessment & Plan:   Intracranial hemorrhage with dense left hemipareiss- Large right thalamic bleed Acquired obstructive hydrocephalus due to thalamic bleed MRI shows stable bleed and  ventricular size, small increase in surrounding edema compared to admission CT.  VP shunt required due to inability to wean EVD. -con't supportive care -appreciate NS management -con't PEG for nutrition. Once back off the vent can keep working with SLP.  -OT, PT -anticipate need for LTAC  Hypertensive  emergency, uncontrolled again -Remains off antihypertensives the last few days> can resume amlodipine and low-dose losartan today -labetalol PRN  ESRD on dialysis hyperkalemia due to end-stage renal disease -HD per nephrology> planning for today -renally dose medications, avoid nephrotoxic meds -strict I/Os -phos binders  Hyponatremia, resolved  Chronic anemia due to ESRD -aranesp per nephrology -transfuse for Hb <7 or hemodynamically significant bleeding  Hyperglycemia, better controlled --Con't TF coverage Q6h with hold parameters -increase insulin glargine to 15 units BID -goal BG 140-180 -SSI PRN; increased to resistant scale  Acute respiratory failure with hypoxia requiring vent, trach for prolonged vent weaning Staph aureus and enterobacter pneumonia; concern for septic shock-- shock resolved -LTVV, con't efforts at vent weaning -trach exchange today; trach care per protocol -7 days of antibiotics with start 6/14 due to resistance   Best Practice: (right click and "Reselect all SmartList Selections" daily)   Diet/type: tubefeeds DVT prophylaxis: Heparin tid GI prophylaxis: PPI Lines: N/A Foley:  N/A Code Status:  full code Last date of multidisciplinary goals of care discussion [6/14]  This patient is critically ill with multiple organ system failure which requires frequent high complexity decision making, assessment, support, evaluation, and titration of therapies. This was completed through the application of advanced monitoring technologies and extensive interpretation of multiple databases. During this encounter critical care time was devoted to patient care services described in this note for 38 minutes.    Julian Hy, DO 09/10/21 8:16 AM  Pulmonary & Critical Care

## 2021-09-10 NOTE — Progress Notes (Signed)
Orthopedic Tech Progress Note Patient Details:  Janet Mitchell 1965/06/11 [id number]  Ortho Devices Type of Ortho Device: Prafo boot/shoe Ortho Device/Splint Location: LLE Ortho Device/Splint Interventions: Ordered, Application, Adjustment   Post Interventions Patient Tolerated: Well Only one prafo boot applied per request of PT because the patient has been moving her right foot.  Vernona Rieger 09/10/2021, 5:15 PM

## 2021-09-10 NOTE — Progress Notes (Signed)
[facility] Progress Note  Subjective: seen in room  Vitals:   09/10/21 1200 09/10/21 1300 09/10/21 1348 09/10/21 1400  BP: 118/69 133/65  132/64  Pulse: 80 87 87 88  Resp: 15 20 (!) 21 (!) 21  Temp: 97.8 F (36.6 C)     TempSrc:      SpO2: 99% 99% 98% 96%  Weight:      Height:        Exam:  No distress, resting  Trach in place  no jvd  Chest cta bilat  Cor reg no RG  Abd soft ntnd no ascites   Ext no LE edema   Neuro - as above    LUA AVF +bruit  Home meds include - asa, tylenol, renavite, cadexomer iodine, phoslo, ceterizine, cholestyramine, hydralazine 10 bid, labetalol 200 bid, losartan 100, psyllium     OP HD: [facility] MWF  4h  97.5kg  450/500  2/2.5 bath  15ga  AVF  Hep none - mircera 100 ug q2 - rocaltrol 1.0 mcg po tiw   Assessment/ Plan Right thalamic intracranial hemorrhage - w/ intraventricular hemorrhage and hydrocephalus. SP ventriculostomy drain placement, then VP shunt surgery was done on 6/14. ESRD - is on MWF HD schedule. HD today.  Hyperkalemia - resolved Volume/ HTN - down to dry wt after extra HD last week. UF 2 L q HD as tolerated for now. BP's wnl, off all IV and po meds.   Anemia - Hb 9-10 range, will resume esa here w/ darbe 100 ug weekly on Wed.  CKD-MBD - On calcitriol for PTH suppression, currently not on binder while getting tube feeds. Phos in range.  Nutrition - On tube feeds with ongoing nutritional supplementation.  Resp failure/ sp trach - per CCM Atrial fib - a/c on hold, off BB, in NSR    [employer], MD 09/09/2021, 10:10 AM  Recent Labs  Lab 09/08/21 0255 09/09/21 0303  HGB 9.7* 9.1*  ALBUMIN 2.9* 2.6*  CALCIUM 10.3 9.3  PHOS 4.9* 5.7*  CREATININE 6.35* 4.92*  K 6.6* 4.8    Inpatient medications:  amantadine  200 mg Per Tube Weekly   calcitRIOL  1 mcg Per Tube Q M,W,F   chlorhexidine  15 mL Mouth Rinse BID   Chlorhexidine Gluconate Cloth  6 each Topical Q0600   Chlorhexidine Gluconate  Cloth  6 each Topical Q0600   darbepoetin (ARANESP) injection - DIALYSIS  100 mcg Intravenous Q Wed-HD   feeding supplement (PROSource TF)  45 mL Per Tube TID   fiber  1 packet Per Tube BID   heparin injection (subcutaneous)  5,000 Units Subcutaneous Q8H   insulin aspart  0-9 Units Subcutaneous Q4H   insulin aspart  6 Units Subcutaneous Q4H   insulin glargine-yfgn  15 Units Subcutaneous Daily   mouth rinse  15 mL Mouth Rinse q12n4p   multivitamin  1 tablet Per Tube QHS   pantoprazole sodium  40 mg Per Tube Daily    sodium chloride 0 mL/hr at 09/06/21 1141   ceFEPime (MAXIPIME) IV     feeding supplement (OSMOLITE 1.5 CAL) 50 mL/hr at 09/09/21 0800   acetaminophen **OR** acetaminophen (TYLENOL) oral liquid 160 mg/5 mL **OR** acetaminophen, diphenhydrAMINE, fentaNYL (SUBLIMAZE) injection, Gerhardt's butt cream, ipratropium-albuterol, labetalol, ondansetron (ZOFRAN) IV, polyvinyl alcohol, senna-docusate     Rob Shamell Suarez 09/10/2021, 2:58 PM   Recent Labs  Lab 09/09/21 0303 09/10/21 0547  HGB 9.1* 8.8*  ALBUMIN 2.6* 2.9*  CALCIUM 9.3 9.8  PHOS 5.7*  6.6*  CREATININE 4.92* 6.93*  K 4.8 5.7*    Inpatient medications:  amantadine  200 mg Per Tube Weekly   amLODipine  10 mg Per Tube Daily   calcitRIOL  1 mcg Per Tube Q M,W,F   chlorhexidine  15 mL Mouth Rinse BID   Chlorhexidine Gluconate Cloth  6 each Topical Q0600   Chlorhexidine Gluconate Cloth  6 each Topical Q0600   Chlorhexidine Gluconate Cloth  6 each Topical Q0600   darbepoetin (ARANESP) injection - DIALYSIS  100 mcg Intravenous Q Fri-HD   feeding supplement (PROSource TF)  45 mL Per Tube TID   fiber  1 packet Per Tube BID   heparin injection (subcutaneous)  5,000 Units Subcutaneous Q8H   insulin aspart  3-9 Units Subcutaneous Q4H   insulin aspart  6 Units Subcutaneous Q4H   insulin glargine-yfgn  15 Units Subcutaneous BID   losartan  25 mg Per Tube Daily   mouth rinse  15 mL Mouth Rinse q12n4p   multivitamin  1  tablet Per Tube QHS   pantoprazole sodium  40 mg Per Tube Daily    sodium chloride 0 mL/hr at 09/06/21 1141   ceFEPime (MAXIPIME) IV Stopped (09/09/21 1828)   feeding supplement (OSMOLITE 1.5 CAL) 50 mL/hr at 09/10/21 0600   acetaminophen **OR** acetaminophen (TYLENOL) oral liquid 160 mg/5 mL **OR** acetaminophen, diphenhydrAMINE, fentaNYL (SUBLIMAZE) injection, Gerhardt's butt cream, ipratropium-albuterol, labetalol, ondansetron (ZOFRAN) IV, polyvinyl alcohol, senna-docusate

## 2021-09-10 NOTE — Progress Notes (Addendum)
STROKE TEAM PROGRESS NOTE   INTERVAL HISTORY  Patient laying in bed on ventilator. She is awake and alert. Left gaze preference, does cross midline. Left arm antigravity, follows commands. She failed attempt on trach collar yesterday, she became tachycardic and tachypneic.  Attempt  trach collar when able. She is spontaneously moving right arm. Withdraws on left to noxious stimuli. RN at the bedside. No family at bedside. . Scheduled for HD today. No new neurological events overnight   Vitals:   09/10/21 0300 09/10/21 0400 09/10/21 0500 09/10/21 0600  BP: 137/72 135/76 (!) 152/83 134/76  Pulse: 85 83 85 87  Resp: 20 (!) 23 (!) 24 (!) 25  Temp:  99.3 F (37.4 C)    TempSrc:  Axillary    SpO2: 100% 100% 99% 98%  Weight:      Height:       CBC:  Recent Labs  Lab 09/09/21 0303 09/10/21 0547  WBC 10.0 8.6  NEUTROABS 7.8* 5.3  HGB 9.1* 8.8*  HCT 27.8* 27.0*  MCV 77.4* 76.5*  PLT 200 146*    Basic Metabolic Panel:  Recent Labs  Lab 09/07/21 0545 09/08/21 0255 09/09/21 0303 09/10/21 0547  NA 131*   < > 130* 135  K 4.4   < > 4.8 5.7*  CL 87*   < > 89* 92*  CO2 24   < > 26 23  GLUCOSE 110*   < > 235* 215*  BUN 65*   < > 51* 76*  CREATININE 5.95*   < > 4.92* 6.93*  CALCIUM 9.9   < > 9.3 9.8  MG 2.0  --   --   --   PHOS 2.8   < > 5.7* 6.6*   < > = values in this interval not displayed.      IMAGING past 24 hours No results found.  PHYSICAL EXAM  Physical Exam  Constitutional: Appears well-developed and well-nourished middle-age African-American lady.  Cardiovascular: Normal rate and regular rhythm.  Respiratory: Effort normal, non-labored breathing, on trach via vent  Neuro - awake, alert, eyes open, nonverbal on vent via trach, but following most simple commands.  Left gaze preference, however able to cross midline with incomplete right gaze, blinking to visual threat bilaterally, PERRL.  Left facial droop. Tongue midline.  Left hemiplegia, RUE and RLE 3/5.  Sensation withdraws to noxious stimuli, right FTN no ataxia, gait not tested.     ASSESSMENT/PLAN Ms. Natalynn Pedone Pinnix is a 56 y.o. female with history of ESRD on HD MWF, CHF, HTN, DM presenting with nausea, vomiting, left side weakness present upon waking. Intubated emergently at [facility] and transferred to [facility] on 5/59.  NSGY consulted, EVD placement 5/29. MRI shows stable size in right thalamic hemorrhage with edema extending to the pons and midbrain. EVD still in place. Extubated 08/26/2021. Reintubated 08/27/2021 and then tracheostomy placed. Consult for PEG tube sent to trauma team.   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 Small volume intraventricular extension of hemorrhage. Mild hydrocephalus. 5/29 Head CT 2200- decrease in size of lateral and third ventricles s/p EVD placement  Repeat CT head 6/12 -1. No significant interval change in the right thalamic  hemorrhage with associated mass effect and mild compression of the third  ventricle. 2. Slight interval increase in the size of the lateral ventricles compared to CT of 09/03/2021 which may represent mild hydrocephalus. CTA head & neck No arterial lesion underlying the right thalamic hematoma. Equivocal for spot sign which would be an adverse finding suggesting continued growth MRI  no change in size of the right thalamic intraparenchymal hemorrhage extending to midbrain, maximal dimension 3.6 cm by MRI. Surrounding edema, likely slightly more prominent ventricles are however similar in size to the CT of 08/23/2021. 2D Echo EF 60-65%, consistent with grade II diastolic dysfunction EEG - mild diffuse encephalopathy LDL 38 HgbA1c 6.2 VTE  prophylaxis - SCDs aspirin 81 mg daily prior to admission, now on No antithrombotic.  Therapy recommendations:  CIR Disposition:  Pending  Obstructive Hydrocephalus NSGY on board 5/29- EVD placement  Follow up CT shows slightly decrease in size of lateral and third ventricle  Unable to tolerate clamping trial with nausea and vomiting.  S/p VP shunt 6/14  Cerebral edema Hyponatremia, improved  On 3% saline @ 75- d/c'd Given ESRD, off 3% saline Na 131-130-135  Acute Hypoxic Respiratory failure Intubated 5/29 CCM managing vent Extubated 6/1 Reintubated and s/p trach 6/2 Required being placed on ventilator 6/12. Unable to wean from vent a this time, attempt as tolerated and appropriate  On Zosyn for HCAP Still copious secretion, continues to be on vent Wean off vent as able  Hypertensive emergency-resolved Home meds:  labetolol, hydralazine, losartan Stable BP less than 160 On losartan 100, labetalol 200 3 times daily, hydralazine 50 3 times daily,  amlodipine 10 Long-term BP goal normotensive cleviprex off  End Stage Renal Failure on hemodialysis MWF Nephrology consulted Monitor BMP/electrolytes  Diabetes Mellitus A1c 6.2, controlled CBGs SSI Close PCP follow-up  Other Stroke Risk Factors Obesity, Body mass index is 33.7 kg/m., BMI >/= 30 associated with increased stroke risk, recommend weight loss, diet and exercise as appropriate  Congestive heart failure  Other Active Problems Hyperkalemia  K 6.1->4.2-> 3.5->6.6, emergent HD->5.7 Dysphagia - cortrak placed on TF, speech on board  Hospital day # [place] DNP, ACNPC-AG    ATTENDING NOTE: I reviewed above note and agree with the assessment and plan. Pt was seen and examined.   Patient lying in bed, no family at bedside.  Neuro stable, unchanged, still follows commands, left gaze preference, left hemiplegia, no acute event overnight.  Patient still on vent, not able to tolerating trach collar  yesterday.  Hyponatremia improved, sodium 135 today.  Tolerating VP shunt well.  For detailed assessment and plan, please refer to above as I have made changes wherever appropriate.   [name], MD PhD Stroke Neurology 09/10/2021 6:16 PM  This patient is critically ill due to respiratory distress, hydrocephalus s/p EVD and now VPS, ICH and IVH, ESRD on HD, hyperkalemia and at significant risk of neurological worsening, death form hydrocephalus, respiratory failure, brain herniation, seizure, sepsis. This patient's care requires constant monitoring of vital signs, hemodynamics, respiratory and cardiac monitoring, review of multiple databases, neurological assessment, discussion with family, other specialists and medical decision making of high complexity. I spent 35 minutes of neurocritical care time in the care of this patient.   To contact Stroke Continuity provider, please refer to [email or web address] After hours, contact General Neurology

## 2021-09-10 NOTE — Progress Notes (Addendum)
[place] [facility] Progress Note Patient Name: Janet Mitchell DOB: Jul 06, 1965 MRN: [id number]   Date of Service  09/10/2021  HPI/Events of Note  Patient with ventilator dyssynchrony, she is not on any sedation.   eICU Interventions  Versed 2 mg iv x 1 ordered.        Myrissa Chipley U Soraya Paquette 09/10/2021, 1:10 AM

## 2021-09-10 NOTE — Progress Notes (Signed)
Called to bedside to assess trach as pt with low expiratory volumes and constant alarming.  Spoke with respiratory who thinks cuff is blown, however pt is saturating well and resting comfortably.  Has been weaning on the vent in the daytime.  Discussed with Dr. Valeta Harms, do not think the trach needs exchanged currently, try PSV.   Otilio Carpen Kyra Laffey, PA-C

## 2021-09-10 NOTE — Progress Notes (Signed)
Patient ID: Janet Mitchell, female   DOB: May 09, 1965, 56 y.o.   MRN: [id number] [facility] Surgery Progress Note  2 Days Post-Op  Subjective: CC-  Stroke team at bedside. Tolerating tube feedings and having loose bowel movements.   Objective: Vital signs in last 24 hours: Temp:  [98.5 F (36.9 C)-99.6 F (37.6 C)] 98.5 F (36.9 C) (06/16 0800) Pulse Rate:  [79-95] 85 (06/16 1000) Resp:  [16-31] 21 (06/16 1000) BP: (104-176)/(48-118) 145/74 (06/16 1000) SpO2:  [98 %-100 %] 99 % (06/16 1000) FiO2 (%):  [40 %] 40 % (06/16 0750) Last BM Date : 09/09/21  Intake/Output from previous day: 06/15 0701 - 06/16 0700 In: 600 [NG/GT:500; IV Piggyback:100] Out: -  Intake/Output this shift: Total I/O In: 100 [NG/GT:100] Out: -   PE: Gen:  Alert, on vent HEENT: trach  Abd: Soft, ND, NT, incisions with glue intact appears well and are without drainage, bleeding, or signs of infection  Lab Results:  Recent Labs    09/09/21 0303 09/10/21 0547  WBC 10.0 8.6  HGB 9.1* 8.8*  HCT 27.8* 27.0*  PLT 200 146*   BMET Recent Labs    09/09/21 0303 09/10/21 0547  NA 130* 135  K 4.8 5.7*  CL 89* 92*  CO2 26 23  GLUCOSE 235* 215*  BUN 51* 76*  CREATININE 4.92* 6.93*  CALCIUM 9.3 9.8   PT/INR No results for input(s): "LABPROT", "INR" in the last 72 hours. CMP     Component Value Date/Time   NA 135 09/10/2021 0547   K 5.7 (H) 09/10/2021 0547   CL 92 (L) 09/10/2021 0547   CO2 23 09/10/2021 0547   GLUCOSE 215 (H) 09/10/2021 0547   BUN 76 (H) 09/10/2021 0547   CREATININE 6.93 (H) 09/10/2021 0547   CREATININE 5.02 (H) 08/29/2019 1506   CALCIUM 9.8 09/10/2021 0547   PROT 7.5 08/23/2021 0632   ALBUMIN 2.9 (L) 09/10/2021 0547   AST 19 08/23/2021 0632   ALT 15 08/23/2021 0632   ALKPHOS 93 08/23/2021 0632   BILITOT 0.1 (L) 08/23/2021 0632   GFRNONAA 7 (L) 09/10/2021 0547   GFRNONAA 9 (L) 08/29/2019 1506   GFRAA 11 (L) 08/29/2019 1506   Lipase     Component Value  Date/Time   LIPASE 19 12/18/2018 0037       Studies/Results: No results found.  Anti-infectives: Anti-infectives (From admission, onward)    Start     Dose/Rate Route Frequency Ordered Stop   09/09/21 1800  ceFEPIme (MAXIPIME) 1 g in sodium chloride 0.9 % 100 mL IVPB        1 g 200 mL/hr over 30 Minutes Intravenous Every 24 hours 09/08/21 1116 09/15/21 1759   09/08/21 1330  ceFEPIme (MAXIPIME) 1 g in sodium chloride 0.9 % 100 mL IVPB        1 g 200 mL/hr over 30 Minutes Intravenous NOW 09/08/21 1234 09/08/21 1352   09/08/21 1215  cefTRIAXone (ROCEPHIN) 1 g in sodium chloride 0.9 % 100 mL IVPB  Status:  Discontinued        1 g 200 mL/hr over 30 Minutes Intravenous  Once 09/08/21 1116 09/08/21 1234   09/08/21 1200  vancomycin (VANCOCIN) IVPB 1000 mg/200 mL premix  Status:  Discontinued        1,000 mg 200 mL/hr over 60 Minutes Intravenous Every M-W-F (Hemodialysis) 09/06/21 1518 09/07/21 0741   09/08/21 0730  ceFAZolin (ANCEF) IVPB 2g/100 mL premix        2 g  200 mL/hr over 30 Minutes Intravenous On call to O.R. 09/08/21 8891 09/08/21 0848   09/06/21 2200  vancomycin (VANCOCIN) IVPB 1000 mg/200 mL premix  Status:  Discontinued        1,000 mg 200 mL/hr over 60 Minutes Intravenous  Once 09/06/21 1518 09/06/21 2156   09/06/21 2000  piperacillin-tazobactam (ZOSYN) IVPB 2.25 g  Status:  Discontinued        2.25 g 100 mL/hr over 30 Minutes Intravenous Every 8 hours 09/06/21 1518 09/08/21 1116   09/06/21 1115  piperacillin-tazobactam (ZOSYN) IVPB 3.375 g        3.375 g 100 mL/hr over 30 Minutes Intravenous  Once 09/06/21 1023 09/06/21 1132   09/06/21 1115  vancomycin (VANCOREADY) IVPB 2000 mg/400 mL        2,000 mg 200 mL/hr over 120 Minutes Intravenous  Once 09/06/21 1024 09/06/21 1342   08/23/21 1230  piperacillin-tazobactam (ZOSYN) IVPB 2.25 g        2.25 g 100 mL/hr over 30 Minutes Intravenous Every 8 hours 08/23/21 1141 08/28/21 0523        Assessment/Plan Hx Right  Thalamus ICH with IVH and Obstructive Hydrocephalus POD #2 s/p dx laparoscopy, lap assistance for VP shunt placement by Dr. Redmond Pulling  - Dr. Redmond Pulling consulted intra-op by Dr. Marcello Moores of [place] for assistance of VP shunt 6/14.  - Incisions clean. Abd soft, tolerating tube feedings via Cortrak and having bowel function  - We will sign off, please call with questions or concerns.    ARF on vent - per CCM Hx of HTN, CHF, DM and ESRD on HD MWF    LOS: 18 days    Wellington Hampshire, [facility] Surgery 09/10/2021, 10:06 AM Please see Amion for pager number during day hours 7:00am-4:30pm

## 2021-09-10 NOTE — Progress Notes (Cosign Needed Addendum)
   Providing Compassionate, Quality Care - Together   Subjective: Patient nurse reports issues with the patient's trach overnight due to a blown cuff. Lurline Idol was changed out this morning and patient is weaning on the ventilator.  Objective: Vital signs in last 24 hours: Temp:  [98.5 F (36.9 C)-99.6 F (37.6 C)] 98.5 F (36.9 C) (06/16 0800) Pulse Rate:  [79-95] 85 (06/16 1000) Resp:  [16-31] 21 (06/16 1000) BP: (104-176)/(48-118) 145/74 (06/16 1000) SpO2:  [98 %-100 %] 99 % (06/16 1000) FiO2 (%):  [40 %] 40 % (06/16 0750)  Intake/Output from previous day: 06/15 0701 - 06/16 0700 In: 600 [NG/GT:500; IV Piggyback:100] Out: -  Intake/Output this shift: Total I/O In: 100 [NG/GT:100] Out: -   Pt responds to voice Left gaze preference Tracheostomy/on vent Able to follow simple commands LUE, LLE hemiplegia RUE, RLE spontaneous movement Incisions at abdomen and right frontal scalp clean, dry, and intact   Lab Results: Recent Labs    09/09/21 0303 09/10/21 0547  WBC 10.0 8.6  HGB 9.1* 8.8*  HCT 27.8* 27.0*  PLT 200 146*   BMET Recent Labs    09/09/21 0303 09/10/21 0547  NA 130* 135  K 4.8 5.7*  CL 89* 92*  CO2 26 23  GLUCOSE 235* 215*  BUN 51* 76*  CREATININE 4.92* 6.93*  CALCIUM 9.3 9.8    Studies/Results: No results found.  Assessment/Plan: Patient is two days status post placement of VP shunt for hydrocephalus following right thalamic ICH.   LOS: 18 days   -Work towards Physicist, medical -Continue supportive efforts   Viona Gilmore, DNP, AGNP-C Nurse Practitioner  [facility] [place]. [address] [place], [place], [place] [address] P: [phone]    F: [phone]  09/10/2021, 10:49 AM

## 2021-09-10 NOTE — Procedures (Signed)
Trach exchange for incompetent trach balloon. Not getting returned volumes on the vent. Pre-oxygenated about 10 min prior to exchange. Sutures removed, trach exchanged over airway exchange catheter. No issues with replacement. Confirmed location with CO2 detector. Now getting returned volumes on the vent. No desaturations or complications.   Julian Hy, DO 09/10/21 7:53 AM [facility]

## 2021-09-10 NOTE — Progress Notes (Signed)
Throughout the night there has been frequent alarming of the ventilator reading low expiratory volumes. Upon my assessment, no displacement or obstruction noted. Noted lost of volumes are contributed to cuff being blown. Frequent pushes of air has been instilled in the cuff and it still leaks accompanied with excessive vent alarms. A cap is place on the pilot balloon inlet to attempt to minimize leak without any sustainable improvement. Patient is stable and respiratory status is stable. Patient saturations are acceptable at 100%. Patient is currently on PSV. Spoken with CCM Mickel Baas, [place] regarding, refer to her note for plans of care.   Appollonia Klee L. Tamala Julian, BS, RRT-ACCS, RCP

## 2021-09-11 DIAGNOSIS — I629 Nontraumatic intracranial hemorrhage, unspecified: Secondary | ICD-10-CM | POA: Diagnosis not present

## 2021-09-11 LAB — GLUCOSE, CAPILLARY
Glucose-Capillary: 106 mg/dL — ABNORMAL HIGH (ref 70–99)
Glucose-Capillary: 113 mg/dL — ABNORMAL HIGH (ref 70–99)
Glucose-Capillary: 126 mg/dL — ABNORMAL HIGH (ref 70–99)
Glucose-Capillary: 165 mg/dL — ABNORMAL HIGH (ref 70–99)
Glucose-Capillary: 187 mg/dL — ABNORMAL HIGH (ref 70–99)
Glucose-Capillary: 62 mg/dL — ABNORMAL LOW (ref 70–99)
Glucose-Capillary: 67 mg/dL — ABNORMAL LOW (ref 70–99)
Glucose-Capillary: 96 mg/dL (ref 70–99)

## 2021-09-11 LAB — RENAL FUNCTION PANEL
Albumin: 3 g/dL — ABNORMAL LOW (ref 3.5–5.0)
Anion gap: 19 — ABNORMAL HIGH (ref 5–15)
BUN: 32 mg/dL — ABNORMAL HIGH (ref 6–20)
CO2: 22 mmol/L (ref 22–32)
Calcium: 9.1 mg/dL (ref 8.9–10.3)
Chloride: 92 mmol/L — ABNORMAL LOW (ref 98–111)
Creatinine, Ser: 3.68 mg/dL — ABNORMAL HIGH (ref 0.44–1.00)
GFR, Estimated: 14 mL/min — ABNORMAL LOW (ref >60)
Glucose, Bld: 134 mg/dL — ABNORMAL HIGH (ref 70–99)
Phosphorus: 3.5 mg/dL (ref 2.5–4.6)
Potassium: 5.4 mmol/L — ABNORMAL HIGH (ref 3.5–5.1)
Sodium: 133 mmol/L — ABNORMAL LOW (ref 135–145)

## 2021-09-11 LAB — CBC
HCT: 33.4 % — ABNORMAL LOW (ref 36.0–46.0)
Hemoglobin: 10.6 g/dL — ABNORMAL LOW (ref 12.0–15.0)
MCH: 24.9 pg — ABNORMAL LOW (ref 26.0–34.0)
MCHC: 31.7 g/dL (ref 30.0–36.0)
MCV: 78.6 fL — ABNORMAL LOW (ref 80.0–100.0)
Platelets: 162 10*3/uL (ref 150–400)
RBC: 4.25 MIL/uL (ref 3.87–5.11)
RDW: 16.7 % — ABNORMAL HIGH (ref 11.5–15.5)
WBC: 9 10*3/uL (ref 4.0–10.5)
nRBC: 0 % (ref 0.0–0.2)

## 2021-09-11 LAB — CULTURE, BLOOD (ROUTINE X 2)
Culture: NO GROWTH
Culture: NO GROWTH
Special Requests: ADEQUATE

## 2021-09-11 MED ORDER — DEXTROSE 50 % IV SOLN
25.0000 mL | Freq: Once | INTRAVENOUS | Status: AC
  Administered 2021-09-11: 25 mL via INTRAVENOUS

## 2021-09-11 MED ORDER — DEXTROSE 50 % IV SOLN
INTRAVENOUS | Status: AC
  Filled 2021-09-11: qty 50

## 2021-09-11 NOTE — Progress Notes (Addendum)
STROKE TEAM PROGRESS NOTE   INTERVAL HISTORY No family at bedside. RN at bedside. Awake and alert on trach collar. Exam similar to yesterday. She is following commands, moves right side spontaneously. Left side minimal movement, withdraws to noxious stimuli. Gaze preference does not cross midline. No new neurological events overnight. If remains on trach collar may transfer out of the unit to SDU today.  Vital signs stable.  Neurological exam unchanged.  Vitals:   09/11/21 0450 09/11/21 0500 09/11/21 0600 09/11/21 0700  BP: 136/84 (!) 153/108 (!) 155/84 (!) 155/78  Pulse:  83  81  Resp:  (!) '22 18 20  '$ Temp:      TempSrc:      SpO2:  99%  99%  Weight:      Height:       CBC:  Recent Labs  Lab 09/09/21 0303 09/10/21 0547 09/11/21 0629  WBC 10.0 8.6 9.0  NEUTROABS 7.8* 5.3  --   HGB 9.1* 8.8* 10.6*  HCT 27.8* 27.0* 33.4*  MCV 77.4* 76.5* 78.6*  PLT 200 146* 109    Basic Metabolic Panel:  Recent Labs  Lab 09/07/21 0545 09/08/21 0255 09/10/21 0547 09/11/21 0629  NA 131*   < > 135 133*  K 4.4   < > 5.7* 5.4*  CL 87*   < > 92* 92*  CO2 24   < > 23 22  GLUCOSE 110*   < > 215* 134*  BUN 65*   < > 76* 32*  CREATININE 5.95*   < > 6.93* 3.68*  CALCIUM 9.9   < > 9.8 9.1  MG 2.0  --   --   --   PHOS 2.8   < > 6.6* 3.5   < > = values in this interval not displayed.      IMAGING past 24 hours No results found.  PHYSICAL EXAM  Physical Exam  Constitutional: Appears well-developed and well-nourished middle-age African-American lady.  Cardiovascular: Normal rate and regular rhythm.  Respiratory: Effort normal, non-labored breathing, on trach via vent  Neuro - awake, alert, eyes open, nonverbal on vent via trach, but following most simple commands.  Left gaze preference, however able to cross midline with incomplete right gaze, blinking to visual threat bilaterally, PERRL.  Left facial droop. Tongue midline.  Left hemiplegia, RUE and RLE 3/5. Sensation withdraws to noxious  stimuli, right FTN no ataxia, gait not tested.    ASSESSMENT/PLAN Janet Mitchell is a 56 y.o. female with history of ESRD on HD MWF, CHF, HTN, DM presenting with nausea, vomiting, left side weakness present upon waking. Intubated emergently at [facility] and transferred to [facility] on 5/59.  NSGY consulted, EVD placement 5/29. MRI shows stable size in right thalamic hemorrhage with edema extending to the pons and midbrain. EVD still in place. Extubated 08/26/2021. Reintubated 08/27/2021 and then tracheostomy placed. Consult for PEG tube sent to trauma team.   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 Small volume intraventricular extension of hemorrhage. Mild hydrocephalus. 5/29 Head CT 2200- decrease in size of lateral and third ventricles s/p EVD placement  Repeat CT head 6/12 -1. No significant interval change in the right thalamic hemorrhage  with associated mass effect and mild compression of the third  ventricle. 2. Slight interval increase in the size of the lateral ventricles compared to CT of 09/03/2021 which may represent mild hydrocephalus. CTA head & neck No arterial lesion underlying the right thalamic hematoma. Equivocal for spot sign which would be an adverse finding suggesting continued growth MRI  no change in size of the right thalamic intraparenchymal hemorrhage extending to midbrain, maximal dimension 3.6 cm by MRI. Surrounding edema, likely slightly more prominent ventricles are however similar in size to the CT of 08/23/2021. 2D Echo EF 60-65%, consistent with grade II diastolic dysfunction EEG - mild diffuse encephalopathy LDL 38 HgbA1c 6.2 VTE prophylaxis - SCDs aspirin 81 mg  daily prior to admission, now on No antithrombotic.  Therapy recommendations:  CIR Disposition:  Pending  Obstructive Hydrocephalus NSGY on board 5/29- EVD placement  Follow up CT shows slightly decrease in size of lateral and third ventricle  Unable to tolerate clamping trial with nausea and vomiting.  S/p VP shunt 6/14  Cerebral edema Hyponatremia, improved  On 3% saline @ 75- d/c'd Given ESRD, off 3% saline Na 131-130-135-133  Acute Hypoxic Respiratory failure Intubated 5/29 CCM managing vent Extubated 6/1 Reintubated and s/p trach 6/2 Required being placed on ventilator 6/12. Unable to wean from vent a this time, attempt as tolerated and appropriate  On Zosyn for HCAP Has been on trach collar since 6/16 and tolerating well   Hypertensive emergency-resolved Home meds:  labetolol, hydralazine, losartan Stable BP less than 160 On losartan 100, labetalol 200 3 times daily, hydralazine 50 3 times daily,  amlodipine 10 Long-term BP goal normotensive cleviprex off  End Stage Renal Failure on hemodialysis MWF Nephrology consulted Monitor BMP/electrolytes  Diabetes Mellitus A1c 6.2, controlled CBGs SSI Close PCP follow-up  Other Stroke Risk Factors Obesity, Body mass index is 33.35 kg/m., BMI >/= 30 associated with increased stroke risk, recommend weight loss, diet and exercise as appropriate  Congestive heart failure  Other Active Problems Hyperkalemia  K 6.1->4.2-> 3.5->6.6, emergent HD->5.7 Dysphagia - cortrak placed on TF, speech on board  Mitchell day # Janet [place] DNP, ACNPC-AG     I have personally obtained history,examined this patient, reviewed notes, independently viewed imaging studies, participated in medical decision making and plan of care.ROS completed by me personally and pertinent positives fully documented  I have made any additions or clarifications directly to the above note. Agree with note above.  Patient remains neurologically stable  with persistent dense left hemiplegia.  She is tolerated VP shunt well and is now on trach collar and hopefully will be weaned off ventilatory support soon.  Will consider transfer to stepdown the next few days if stable.  She unfortunately cannot have a PEG tube for 6 weeks at least placement will be in issue.  No family at the bedside.  Discussed with Dr. Ander Slade critical care medicine.This patient is critically ill and at significant risk of neurological worsening, death and care requires constant monitoring of vital signs, hemodynamics,respiratory and cardiac monitoring, extensive review of multiple databases, frequent neurological assessment, discussion with family, other specialists and medical decision making of high complexity.I have made any additions or clarifications directly to the above note.This critical care time does not reflect procedure time, or teaching time or supervisory time of PA/NP/Med Resident etc but could involve care discussion time.  I spent 30 minutes of neurocritical care time  in the care of  this patient.      Kijuana Ruppel  Leonie Man, [place] Pager: [phone] 09/11/2021 2:03 PM  To contact Stroke Continuity provider, please refer to [email or web address] After hours, contact General Neurology

## 2021-09-11 NOTE — Progress Notes (Signed)
Patient ID: Janet Mitchell, female   DOB: 1965-08-06, 56 y.o.   MRN: [id number] Vital signs are stable Patient is alert and attentive Hemiparesis unchanged Shunt in place and no complicating features

## 2021-09-11 NOTE — Progress Notes (Signed)
NAME:  Janet Mitchell, MRN:  [id number], DOB:  1965/04/29, LOS: 19 ADMISSION DATE:  08/23/2021, CONSULTATION DATE:  08/23/2021 REFERRING MD:  Dr. Rory Percy - Neuro CHIEF COMPLAINT: Code Stroke  History of Present Illness:  56 year old woman who presented to [facility] 5/29 with reported left-sided weakness, slurred speech, nausea/vomiting. LKN 6 hours prior to arrival. PMHx significant for HTN, CHF (Echo 07/2021 EF 60-65%, severe LVH, G2DD), T2DM (c/b diabetic neuropathy with subsequent L great toe amputation 2/2 ulcer/infection), ESRD (on HD MWF via [facility] TDC).  On arrival to ED, patient is somnolent with slight disconjugate gaze with near flaccid left upper arm and left leg weakness. BP 222/107. CT Head with 5cc acute hematoma in the right thalamus with intraventricular extension. Neurology consulted and patient transferred to [facility] for further evaluation. PCCM consulted for admission.  On arrival to unit patient is intubated, pupils 40m bilaterally and non-reactive. Withdrawals in all extremities, does not follow commands, on Propofol and Cleviprex gtt.    Pertinent Medical History:  ESRD with HD MWF, CHF, HTN, DM, s/p left great toe amputation secondary to ulcer/infection   Significant Hospital Events: Including procedures, antibiotic start and stop dates in addition to other pertinent events   [facility], more awake, following commands with RUE/RLE 6/1 No issues overnight, she remains alert and interactive on vent.  Extubated 6/2 failed extubation requiring reintubation. Trached. 6/4 Placed on ATC yesterday afternoon and has tolerated well since 6/5: EVD raised to 20 cm H20 6/7: Failed EVD clamping trial after developing emesis and headache 6/8: Failed EVD clamping due to emesis 6/9: Increase EVD pressure to 25 6/12 back on vent due to hypoxia from mucus plugging, started back on antibiotics 6/14 VP shunt placed. Antibiotics  adjusted based on cultures 6/17-has been off the vent since 09/10/2021, did not require to be on overnight   Interim History / Subjective:  Off vent Hemodynamically stable Thick tracheal secretions  Objective:  Blood pressure (!) (P) 175/85, pulse 86, temperature 97.8 F (36.6 C), temperature source Axillary, resp. rate (!) 27, height '5\' 7"'$  (1.702 m), weight 96.6 kg, SpO2 98 %.    Vent Mode: Stand-by FiO2 (%):  [35 %-40 %] 35 % PEEP:  [5 cmH20] 5 cmH20 Pressure Support:  [5 cmH20] 5 cmH20   Intake/Output Summary (Last 24 hours) at 09/11/2021 0912 Last data filed at 09/11/2021 0800 Gross per 24 hour  Intake 1150 ml  Output 1900 ml  Net -750 ml   Filed Weights   09/08/21 1359 09/11/21 0027 09/11/21 0440  Weight: 97.6 kg 98.2 kg 96.6 kg   Physical Examination:  General: Chronically ill-appearing, appears comfortable HEENT: EVD site dressings in place Neck: Some thick trach secretions, no erythema Neuro: Awake, she appears to nod to questions hemiparetic on the left.    CV: S1-S2 appreciated PULM: Minimal rhonchi GI: soft, NT Extremities: No edema, surgically absent first toe on the left. Skin: Warm, dry, no diffuse rashes.  Sodium 133, potassium 5.4, chloride 92, BUN 32, creatinine 3.68 WBC count of 9, hematocrit 33, platelets  Assessment & Plan:   Intracranial hemorrhage with dense left hemiparesis Large right thalamic bleed Obstructive hydrocephalus S/p VP shunt placement -Neurosurgery continues to follow -Anticipate need for LTAC  Hypertensive emergency -Labetalol as needed -Amlodipine, losartan  End-stage renal disease on hemodialysis -Renal continues to follow -Avoid nephrotoxic's, renally dose medications  Chronic anemia due to end-stage renal disease -On Aranesp -Has  been stable  Hyperglycemia -Continue SSI  Respiratory failure -Off ventilator -Did have a trach change on 09/10/2021  On cefepime -To continue till 6/21 -Enterobacter and Staph  aureus in trach secretions  Diarrhea -Improving  Best Practice: (right click and "Reselect all SmartList Selections" daily)   Diet/type: tubefeeds DVT prophylaxis: Heparin tid GI prophylaxis: PPI Lines: N/A Foley:  N/A Code Status:  full code Last date of multidisciplinary goals of care discussion [6/14]  The patient is critically ill with multiple organ systems failure and requires high complexity decision making for assessment and support, frequent evaluation and titration of therapies, application of advanced monitoring technologies and extensive interpretation of multiple databases. Critical Care Time devoted to patient care services described in this note independent of APP/resident time (if applicable)  is 35 minutes.   Sherrilyn Rist MD [facility] Personal pager: See Amion If unanswered, please page CCM On-call: [phone]

## 2021-09-11 NOTE — Progress Notes (Signed)
[facility] Progress Note  Subjective: seen in room  Vitals:   09/11/21 1145 09/11/21 1200 09/11/21 1300 09/11/21 1400  BP:  (!) 142/68 134/65 121/84  Pulse:  78 76 74  Resp:  (!) 21 (!) 21 19  Temp:      TempSrc:      SpO2: 97% 98% 97% 97%  Weight:      Height:        Exam:  No distress, resting  Trach in place  no jvd  Chest cta bilat  Cor reg no RG  Abd soft ntnd no ascites   Ext no LE edema   Neuro - as above    LUA AVF +bruit  Home meds include - asa, tylenol, renavite, cadexomer iodine, phoslo, ceterizine, cholestyramine, hydralazine 10 bid, labetalol 200 bid, losartan 100, psyllium     OP HD: [facility] MWF  4h  97.5kg  450/500  2/2.5 bath  15ga  AVF  Hep none - mircera 100 ug q2 - rocaltrol 1.0 mcg po tiw   Assessment/ Plan Right thalamic intracranial hemorrhage - w/ intraventricular hemorrhage and hydrocephalus. SP ventriculostomy drain placement, then VP shunt surgery was done on 6/14. Per neurosurgery.  ESRD - is on MWF HD schedule. Next HD monday.  Volume/ HTN - down to dry wt last week. UF 2 L q HD as tolerated for now. BP's were up so losartan and norvasc were resumed.  Anemia - Hb 9-10 range, will resume esa here w/ darbe 100 ug weekly on Wed.  CKD-MBD - On calcitriol for PTH suppression, currently not on binder while getting tube feeds. Phos in range.  Nutrition - On tube feeds with ongoing nutritional supplementation.  Resp failure/ sp trach - per CCM Atrial fib - a/c on hold, off BB, in NSR    [employer], MD 09/09/2021, 10:10 AM  Recent Labs  Lab 09/08/21 0255 09/09/21 0303  HGB 9.7* 9.1*  ALBUMIN 2.9* 2.6*  CALCIUM 10.3 9.3  PHOS 4.9* 5.7*  CREATININE 6.35* 4.92*  K 6.6* 4.8    Inpatient medications:  amantadine  200 mg Per Tube Weekly   calcitRIOL  1 mcg Per Tube Q M,W,F   chlorhexidine  15 mL Mouth Rinse BID   Chlorhexidine Gluconate Cloth  6 each Topical Q0600   Chlorhexidine Gluconate Cloth  6 each  Topical Q0600   darbepoetin (ARANESP) injection - DIALYSIS  100 mcg Intravenous Q Wed-HD   feeding supplement (PROSource TF)  45 mL Per Tube TID   fiber  1 packet Per Tube BID   heparin injection (subcutaneous)  5,000 Units Subcutaneous Q8H   insulin aspart  0-9 Units Subcutaneous Q4H   insulin aspart  6 Units Subcutaneous Q4H   insulin glargine-yfgn  15 Units Subcutaneous Daily   mouth rinse  15 mL Mouth Rinse q12n4p   multivitamin  1 tablet Per Tube QHS   pantoprazole sodium  40 mg Per Tube Daily    sodium chloride 0 mL/hr at 09/06/21 1141   ceFEPime (MAXIPIME) IV     feeding supplement (OSMOLITE 1.5 CAL) 50 mL/hr at 09/09/21 0800   acetaminophen **OR** acetaminophen (TYLENOL) oral liquid 160 mg/5 mL **OR** acetaminophen, diphenhydrAMINE, fentaNYL (SUBLIMAZE) injection, Gerhardt's butt cream, ipratropium-albuterol, labetalol, ondansetron (ZOFRAN) IV, polyvinyl alcohol, senna-docusate     Rob Chlora Mcbain 09/11/2021, 3:20 PM   Recent Labs  Lab 09/10/21 0547 09/11/21 0629  HGB 8.8* 10.6*  ALBUMIN 2.9* 3.0*  CALCIUM 9.8 9.1  PHOS 6.6* 3.5  CREATININE 6.93*  3.68*  K 5.7* 5.4*   Inpatient medications:  amantadine  200 mg Per Tube Weekly   amLODipine  10 mg Per Tube Daily   calcitRIOL  1 mcg Per Tube Q M,W,F   chlorhexidine  15 mL Mouth Rinse BID   Chlorhexidine Gluconate Cloth  6 each Topical Q0600   Chlorhexidine Gluconate Cloth  6 each Topical Q0600   Chlorhexidine Gluconate Cloth  6 each Topical Q0600   darbepoetin (ARANESP) injection - DIALYSIS  100 mcg Intravenous Q Fri-HD   feeding supplement (PROSource TF)  45 mL Per Tube TID   fiber  1 packet Per Tube BID   heparin injection (subcutaneous)  5,000 Units Subcutaneous Q8H   insulin aspart  3-9 Units Subcutaneous Q4H   insulin aspart  6 Units Subcutaneous Q4H   insulin glargine-yfgn  15 Units Subcutaneous BID   losartan  25 mg Per Tube Daily   mouth rinse  15 mL Mouth Rinse q12n4p   multivitamin  1 tablet Per Tube QHS    pantoprazole sodium  40 mg Per Tube Daily    sodium chloride 0 mL/hr at 09/06/21 1141   ceFEPime (MAXIPIME) IV Stopped (09/10/21 1736)   feeding supplement (OSMOLITE 1.5 CAL) 50 mL/hr at 09/11/21 0600   acetaminophen **OR** acetaminophen (TYLENOL) oral liquid 160 mg/5 mL **OR** acetaminophen, diphenhydrAMINE, fentaNYL (SUBLIMAZE) injection, Gerhardt's butt cream, ipratropium-albuterol, labetalol, ondansetron (ZOFRAN) IV, polyvinyl alcohol, senna-docusate

## 2021-09-11 NOTE — Progress Notes (Signed)
Hd tx completed. Pt tolerated well. Avf w/out s/s of complications. No pt acute distress noted. Report given to sarah g bedside rn.  Total uf: 1996m Medications given: aranesp 1018m Post hd vs:136/84(99) hr83 97.8temp spo2 99% 24rr Post hd weight:96.6kg

## 2021-09-12 DIAGNOSIS — E11649 Type 2 diabetes mellitus with hypoglycemia without coma: Secondary | ICD-10-CM

## 2021-09-12 DIAGNOSIS — I629 Nontraumatic intracranial hemorrhage, unspecified: Secondary | ICD-10-CM | POA: Diagnosis not present

## 2021-09-12 DIAGNOSIS — N186 End stage renal disease: Secondary | ICD-10-CM | POA: Diagnosis not present

## 2021-09-12 DIAGNOSIS — G911 Obstructive hydrocephalus: Secondary | ICD-10-CM | POA: Diagnosis not present

## 2021-09-12 DIAGNOSIS — J9601 Acute respiratory failure with hypoxia: Secondary | ICD-10-CM | POA: Diagnosis not present

## 2021-09-12 LAB — RENAL FUNCTION PANEL
Albumin: 2.7 g/dL — ABNORMAL LOW (ref 3.5–5.0)
Anion gap: 12 (ref 5–15)
BUN: 56 mg/dL — ABNORMAL HIGH (ref 6–20)
CO2: 29 mmol/L (ref 22–32)
Calcium: 9.3 mg/dL (ref 8.9–10.3)
Chloride: 93 mmol/L — ABNORMAL LOW (ref 98–111)
Creatinine, Ser: 5.26 mg/dL — ABNORMAL HIGH (ref 0.44–1.00)
GFR, Estimated: 9 mL/min — ABNORMAL LOW (ref >60)
Glucose, Bld: 133 mg/dL — ABNORMAL HIGH (ref 70–99)
Phosphorus: 6.2 mg/dL — ABNORMAL HIGH (ref 2.5–4.6)
Potassium: 5.4 mmol/L — ABNORMAL HIGH (ref 3.5–5.1)
Sodium: 134 mmol/L — ABNORMAL LOW (ref 135–145)

## 2021-09-12 LAB — GLUCOSE, CAPILLARY
Glucose-Capillary: 133 mg/dL — ABNORMAL HIGH (ref 70–99)
Glucose-Capillary: 143 mg/dL — ABNORMAL HIGH (ref 70–99)
Glucose-Capillary: 160 mg/dL — ABNORMAL HIGH (ref 70–99)
Glucose-Capillary: 175 mg/dL — ABNORMAL HIGH (ref 70–99)
Glucose-Capillary: 182 mg/dL — ABNORMAL HIGH (ref 70–99)
Glucose-Capillary: 93 mg/dL (ref 70–99)

## 2021-09-12 LAB — CBC
HCT: 29.3 % — ABNORMAL LOW (ref 36.0–46.0)
Hemoglobin: 9.3 g/dL — ABNORMAL LOW (ref 12.0–15.0)
MCH: 24.5 pg — ABNORMAL LOW (ref 26.0–34.0)
MCHC: 31.7 g/dL (ref 30.0–36.0)
MCV: 77.3 fL — ABNORMAL LOW (ref 80.0–100.0)
Platelets: 185 10*3/uL (ref 150–400)
RBC: 3.79 MIL/uL — ABNORMAL LOW (ref 3.87–5.11)
RDW: 16.5 % — ABNORMAL HIGH (ref 11.5–15.5)
WBC: 10.1 10*3/uL (ref 4.0–10.5)
nRBC: 0 % (ref 0.0–0.2)

## 2021-09-12 MED ORDER — NEPRO/CARBSTEADY PO LIQD
1000.0000 mL | ORAL | Status: DC
  Administered 2021-09-12 – 2021-09-15 (×4): 1000 mL
  Filled 2021-09-12 (×8): qty 1000

## 2021-09-12 MED ORDER — CHLORHEXIDINE GLUCONATE CLOTH 2 % EX PADS
6.0000 | MEDICATED_PAD | Freq: Every day | CUTANEOUS | Status: DC
Start: 2021-09-13 — End: 2021-09-14
  Administered 2021-09-13 – 2021-09-14 (×2): 6 via TOPICAL

## 2021-09-12 MED ORDER — INSULIN ASPART 100 UNIT/ML IJ SOLN
0.0000 [IU] | INTRAMUSCULAR | Status: DC
  Administered 2021-09-12: 2 [IU] via SUBCUTANEOUS
  Administered 2021-09-13: 1 [IU] via SUBCUTANEOUS
  Administered 2021-09-13 (×2): 2 [IU] via SUBCUTANEOUS
  Administered 2021-09-13 – 2021-09-14 (×6): 1 [IU] via SUBCUTANEOUS
  Administered 2021-09-14 (×2): 2 [IU] via SUBCUTANEOUS
  Administered 2021-09-15: 1 [IU] via SUBCUTANEOUS
  Administered 2021-09-15: 3 [IU] via SUBCUTANEOUS
  Administered 2021-09-15: 2 [IU] via SUBCUTANEOUS
  Administered 2021-09-15 – 2021-09-17 (×4): 1 [IU] via SUBCUTANEOUS
  Administered 2021-09-18 (×5): 2 [IU] via SUBCUTANEOUS
  Administered 2021-09-18: 1 [IU] via SUBCUTANEOUS
  Administered 2021-09-19 (×3): 2 [IU] via SUBCUTANEOUS
  Administered 2021-09-19: 1 [IU] via SUBCUTANEOUS
  Administered 2021-09-19 – 2021-09-20 (×6): 2 [IU] via SUBCUTANEOUS
  Administered 2021-09-21: 1 [IU] via SUBCUTANEOUS
  Administered 2021-09-21 (×5): 2 [IU] via SUBCUTANEOUS
  Administered 2021-09-22 (×2): 1 [IU] via SUBCUTANEOUS
  Administered 2021-09-23: 2 [IU] via SUBCUTANEOUS
  Administered 2021-09-23: 1 [IU] via SUBCUTANEOUS
  Administered 2021-09-23: 2 [IU] via SUBCUTANEOUS
  Administered 2021-09-23: 3 [IU] via SUBCUTANEOUS
  Administered 2021-09-23 (×2): 1 [IU] via SUBCUTANEOUS
  Administered 2021-09-24 (×5): 2 [IU] via SUBCUTANEOUS
  Administered 2021-09-24: 3 [IU] via SUBCUTANEOUS
  Administered 2021-09-25: 2 [IU] via SUBCUTANEOUS
  Administered 2021-09-25: 1 [IU] via SUBCUTANEOUS
  Administered 2021-09-25: 3 [IU] via SUBCUTANEOUS
  Administered 2021-09-25 – 2021-09-26 (×3): 2 [IU] via SUBCUTANEOUS
  Administered 2021-09-26: 1 [IU] via SUBCUTANEOUS
  Administered 2021-09-26: 3 [IU] via SUBCUTANEOUS
  Administered 2021-09-26 – 2021-09-27 (×6): 2 [IU] via SUBCUTANEOUS
  Administered 2021-09-27 (×2): 3 [IU] via SUBCUTANEOUS
  Administered 2021-09-28: 2 [IU] via SUBCUTANEOUS
  Administered 2021-09-28: 1 [IU] via SUBCUTANEOUS
  Administered 2021-09-28 – 2021-09-30 (×13): 2 [IU] via SUBCUTANEOUS
  Administered 2021-09-30: 1 [IU] via SUBCUTANEOUS
  Administered 2021-09-30 – 2021-10-02 (×5): 2 [IU] via SUBCUTANEOUS
  Administered 2021-10-02: 1 [IU] via SUBCUTANEOUS
  Administered 2021-10-02: 2 [IU] via SUBCUTANEOUS
  Administered 2021-10-02: 1 [IU] via SUBCUTANEOUS
  Administered 2021-10-02: 2 [IU] via SUBCUTANEOUS
  Administered 2021-10-03: 5 [IU] via SUBCUTANEOUS
  Administered 2021-10-03 (×3): 2 [IU] via SUBCUTANEOUS
  Administered 2021-10-03 (×2): 1 [IU] via SUBCUTANEOUS
  Administered 2021-10-04 (×2): 2 [IU] via SUBCUTANEOUS
  Administered 2021-10-04: 1 [IU] via SUBCUTANEOUS
  Administered 2021-10-04: 2 [IU] via SUBCUTANEOUS
  Administered 2021-10-05: 1 [IU] via SUBCUTANEOUS
  Administered 2021-10-05: 2 [IU] via SUBCUTANEOUS

## 2021-09-12 NOTE — Progress Notes (Addendum)
STROKE TEAM PROGRESS NOTE   INTERVAL HISTORY Patient is awake and alert. Still on trach collar, lots of secretions.  Follows commands. Left arm with increased tone. Hemiplegic on left side, Withdraws to noxious stimuli on left side. Right arm and leg 4/5. Vitals are stable. New neurological events. No family at bedside. Will ask Hospitalist to assume care of patient tomorrow.    Vitals:   09/12/21 0410 09/12/21 0500 09/12/21 0700 09/12/21 0800  BP: (!) 145/74 (!) 141/75 126/66 119/66  Pulse: 86   79  Resp: (!) '22 19 19 20  '$ Temp:      TempSrc:      SpO2: 97%   93%  Weight:  98.7 kg    Height:       CBC:  Recent Labs  Lab 09/09/21 0303 09/10/21 0547 09/11/21 0629 09/12/21 0223  WBC 10.0 8.6 9.0 10.1  NEUTROABS 7.8* 5.3  --   --   HGB 9.1* 8.8* 10.6* 9.3*  HCT 27.8* 27.0* 33.4* 29.3*  MCV 77.4* 76.5* 78.6* 77.3*  PLT 200 146* 162 597    Basic Metabolic Panel:  Recent Labs  Lab 09/07/21 0545 09/08/21 0255 09/11/21 0629 09/12/21 0223  NA 131*   < > 133* 134*  K 4.4   < > 5.4* 5.4*  CL 87*   < > 92* 93*  CO2 24   < > 22 29  GLUCOSE 110*   < > 134* 133*  BUN 65*   < > 32* 56*  CREATININE 5.95*   < > 3.68* 5.26*  CALCIUM 9.9   < > 9.1 9.3  MG 2.0  --   --   --   PHOS 2.8   < > 3.5 6.2*   < > = values in this interval not displayed.      IMAGING past 24 hours No results found.  PHYSICAL EXAM  Physical Exam  Constitutional: Appears well-developed and well-nourished middle-age African-American lady.  Cardiovascular: Normal rate and regular rhythm.  Respiratory: Effort normal, non-labored breathing, on trach via vent  Neuro - awake, alert, eyes open, nonverbal on vent via trach, but following most simple commands.  Left gaze preference, however able to cross midline with incomplete right gaze, blinking to visual threat bilaterally, PERRL.  Left facial droop. Tongue midline.  Left hemiplegia with increased tone, RUE and RLE 3/5. Sensation withdraws to noxious  stimuli, right FTN no ataxia, gait not tested.    ASSESSMENT/PLAN [name] is a 56 y.o. female with history of ESRD on HD MWF, CHF, HTN, DM presenting with nausea, vomiting, left side weakness present upon waking. Intubated emergently at [facility] and transferred to [facility] on 5/59.  NSGY consulted, EVD placement 5/29. MRI shows stable size in right thalamic hemorrhage with edema extending to the pons and midbrain. EVD still in place. Extubated 08/26/2021. Reintubated 08/27/2021 and then tracheostomy placed. Consult for PEG tube sent to trauma team.   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 Small volume intraventricular extension of hemorrhage. Mild hydrocephalus. 5/29 Head CT 2200- decrease in size of lateral and third ventricles s/p EVD placement  Repeat CT head 6/12 -1. No significant interval change in  the right thalamic hemorrhage with associated mass effect and mild compression of the third  ventricle. 2. Slight interval increase in the size of the lateral ventricles compared to CT of 09/03/2021 which may represent mild hydrocephalus. CTA head & neck No arterial lesion underlying the right thalamic hematoma. Equivocal for spot sign which would be an adverse finding suggesting continued growth MRI  no change in size of the right thalamic intraparenchymal hemorrhage extending to midbrain, maximal dimension 3.6 cm by MRI. Surrounding edema, likely slightly more prominent ventricles are however similar in size to the CT of 08/23/2021. 2D Echo EF 60-65%, consistent with grade II diastolic dysfunction EEG - mild diffuse encephalopathy LDL 38 HgbA1c 6.2 VTE prophylaxis - SCDs aspirin 81 mg  daily prior to admission, now on No antithrombotic.  Therapy recommendations:  CIR Disposition:  Pending  Obstructive Hydrocephalus NSGY on board 5/29- EVD placement  Follow up CT shows slightly decrease in size of lateral and third ventricle  Unable to tolerate clamping trial with nausea and vomiting.  S/p VP shunt 6/14  Cerebral edema Hyponatremia, improved  On 3% saline @ 75- d/c'd Given ESRD, off 3% saline Na 131-130-135-133-134  Acute Hypoxic Respiratory failure Intubated 5/29 CCM managing vent Extubated 6/1 Reintubated and s/p trach 6/2 Required being placed on ventilator 6/12. Unable to wean from vent a this time, attempt as tolerated and appropriate  On Zosyn for HCAP Has been on trach collar since 6/16 and tolerating well   Hypertensive emergency-resolved Home meds:  labetolol, hydralazine, losartan Stable BP less than 160 On losartan 100, labetalol 200 3 times daily, hydralazine 50 3 times daily,  amlodipine 10 Long-term BP goal normotensive cleviprex off  End Stage Renal Failure on hemodialysis MWF Nephrology consulted Monitor BMP/electrolytes  Diabetes Mellitus A1c 6.2, controlled CBGs SSI Close PCP follow-up  Dysphagia  cortrak placed on TF speech on board Per surgery, pt not candidate for PEG given recent VPS and anatomy. If need feeding tube, pt will need open abdomen surgery  Other Stroke Risk Factors Obesity, Body mass index is 34.08 kg/m., BMI >/= 30 associated with increased stroke risk, recommend weight loss, diet and exercise as appropriate  Congestive heart failure  Other Active Problems Hyperkalemia  K 6.1->4.2-> 3.5->6.6, emergent HD->5.7  Hospital day # [place] DNP, ACNPC-AG    ATTENDING NOTE: I reviewed above note and agree with the assessment and plan. Pt was seen and examined.   No family at bedside.  Patient eyes open, awake, alert, still mildly lethargic but follow all simple commands right.  Still has left gaze  preference and left UE plegia and left lower extremity withdraw 2+/5 on pain stimulation.  No significant neuro change.  Patient tolerating trach collar all day yesterday, currently still on trach collar, although still has copious secretions, needing frequent suctioning.  Tolerating VPS so far, NSG on board. Discussed with CCM Dr. Ander Slade, pt seems stable to transfer out of ICU today.   For detailed assessment and plan, please refer to above/below as I have made changes wherever appropriate.   Janet Hawking, MD PhD Stroke Neurology 09/12/2021 2:36 PM  This patient is critically ill due to respiratory distress, hydrocephalus s/p EVD and now VPS, ICH and IVH, ESRD on HD, hyperkalemia and at significant risk of neurological worsening, death form hydrocephalus, respiratory failure, brain herniation, seizure, sepsis. This patient's care requires constant monitoring of vital signs, hemodynamics, respiratory and cardiac monitoring, review of multiple databases, neurological assessment, discussion with family, other specialists and  medical decision making of high complexity. I spent 30 minutes of neurocritical care time in the care of this patient.   To contact Stroke Continuity provider, please refer to [email or web address] After hours, contact General Neurology

## 2021-09-12 NOTE — Progress Notes (Signed)
NAME:  Janet Mitchell, MRN:  [id number], DOB:  05/02/1965, LOS: 57 ADMISSION DATE:  08/23/2021, CONSULTATION DATE:  08/23/2021 REFERRING MD:  Dr. Rory Percy - Neuro CHIEF COMPLAINT: Code Stroke  History of Present Illness:  56 year old woman who presented to [facility] 5/29 with reported left-sided weakness, slurred speech, nausea/vomiting. LKN 6 hours prior to arrival. PMHx significant for HTN, CHF (Echo 07/2021 EF 60-65%, severe LVH, G2DD), T2DM (c/b diabetic neuropathy with subsequent L great toe amputation 2/2 ulcer/infection), ESRD (on HD MWF via [facility] TDC).  On arrival to ED, patient is somnolent with slight disconjugate gaze with near flaccid left upper arm and left leg weakness. BP 222/107. CT Head with 5cc acute hematoma in the right thalamus with intraventricular extension. Neurology consulted and patient transferred to [facility] for further evaluation. PCCM consulted for admission.  On arrival to unit patient is intubated, pupils 61m bilaterally and non-reactive. Withdrawals in all extremities, does not follow commands, on Propofol and Cleviprex gtt.    Pertinent Medical History:  ESRD with HD MWF, CHF, HTN, DM, s/p left great toe amputation secondary to ulcer/infection   Significant Hospital Events: Including procedures, antibiotic start and stop dates in addition to other pertinent events   [facility] HD, more awake, following commands with RUE/RLE 6/1 No issues overnight, she remains alert and interactive on vent.  Extubated 6/2 failed extubation requiring reintubation. Trached. 6/4 Placed on ATC yesterday afternoon and has tolerated well since 6/5: EVD raised to 20 cm H20 6/7: Failed EVD clamping trial after developing emesis and headache 6/8: Failed EVD clamping due to emesis 6/9: Increase EVD pressure to 25 6/12 back on vent due to hypoxia from mucus plugging, started back on antibiotics 6/14 VP shunt placed. Antibiotics  adjusted based on cultures 6/17-has been off the vent since 09/10/2021, did not require to be on overnight  Interim History / Subjective:  Remains off the ventilator Hemodynamically stable Thick tracheal secretions stable  Objective:  Blood pressure 119/66, pulse 80, temperature 99.9 F (37.7 C), temperature source Oral, resp. rate 15, height '5\' 7"'$  (1.702 m), weight 98.7 kg, SpO2 95 %.    FiO2 (%):  [35 %] 35 %   Intake/Output Summary (Last 24 hours) at 09/12/2021 0943 Last data filed at 09/12/2021 0600 Gross per 24 hour  Intake 1200 ml  Output 150 ml  Net 1050 ml   Filed Weights   09/11/21 0027 09/11/21 0440 09/12/21 0500  Weight: 98.2 kg 96.6 kg 98.7 kg   Physical Examination:  General: Chronically ill-appearing, does appear comfortable  HEENT: EVD in place, trach site stable Neck: Does have some thick secretions Neuro: Easily arousable, nods to questions, hemiparetic on left CV: S1-S2 appreciated PULM: Minimal rhonchi  GI: Soft, nontender, bowel sounds appreciated Extremities: No edema, surgically absent first toe on the left. Skin: Warm, dry, no diffuse rashes.  Labs reviewed  Assessment & Plan:   Intracranial hemorrhage with dense left hemiparesis Large right thalamic bleed Obstructive hydrocephalus S/p shunt placement -Appreciate neurosurgery follow-up -On [place] need for LTAC  Hypertensive emergency -Labetalol as needed -Amlodipine, losartan  End-stage renal disease on hemodialysis -Renal service continues to follow -Avoid nephrotoxic's, renally dose medications -For dialysis 619  Chronic anemia due to end-stage renal disease -On iron minutes -Has remained stable  Hyperglycemia -Continue SSI  Respiratory failure -Off ventilator -Trach was changed 09/10/2021  To continue antibiotics up until 6/21 -Enterobacter and Staph aureus in trach secretions  Best Practice: (right click and "Reselect all SmartList Selections" daily)   Diet/type:  tubefeeds DVT prophylaxis: Heparin tid GI prophylaxis: PPI Lines: N/A Foley:  N/A Code Status:  full code Last date of multidisciplinary goals of care discussion [6/14]  The patient is critically ill with multiple organ systems failure and requires high complexity decision making for assessment and support, frequent evaluation and titration of therapies, application of advanced monitoring technologies and extensive interpretation of multiple databases. Critical Care Time devoted to patient care services described in this note independent of APP/resident time (if applicable)  is 32 minutes.   Sherrilyn Rist MD [place] [facility] Personal pager: See Amion If unanswered, please page CCM On-call: [phone]

## 2021-09-12 NOTE — Progress Notes (Signed)
Patient ID: Janet Mitchell, female   DOB: 1965/09/24, 56 y.o.   MRN: [id number] Vital signs are stable and patient is afebrile some eye-opening today with hemiparesis unchanged.  Continue supportive care

## 2021-09-12 NOTE — Progress Notes (Signed)
[facility] Progress Note  Subjective: seen in ICU.   Vitals:   09/12/21 0800 09/12/21 0811 09/12/21 0900 09/12/21 1000  BP: 119/66 119/66 135/70 (!) 149/74  Pulse: 79 80 86 85  Resp: '20 15 19 20  '$ Temp:      TempSrc:      SpO2: 93% 95% 98% 97%  Weight:      Height:        Exam:  No distress, resting  Trach in place, off the vent  no jvd  Chest cta bilat  Cor reg no RG  Abd soft ntnd no ascites   Ext no LE edema   Neuro - as above    LUA AVF +bruit  Home meds include - asa, tylenol, renavite, cadexomer iodine, phoslo, ceterizine, cholestyramine, hydralazine 10 bid, labetalol 200 bid, losartan 100, psyllium     OP HD: [facility] MWF  4h  97.5kg  450/500  2/2.5 bath  15ga  AVF  Hep none - mircera 100 ug q2 - rocaltrol 1.0 mcg po tiw   Assessment/ Plan Right thalamic intracranial hemorrhage - w/ intraventricular hemorrhage and hydrocephalus. SP ventriculostomy drain placement, then VP shunt surgery was done on 6/14. Per neurosurgery.  ESRD - is on MWF HD schedule. Next HD monday.  Volume/ HTN - down to dry wt last week. UF 2 L q HD as tolerated for now. BP's were up so losartan and norvasc were resumed.  Anemia - Hb 9-10 range, will resume esa here w/ darbe 100 ug weekly on Wed.  CKD-MBD - On calcitriol for PTH suppression, currently not on binder while getting tube feeds. Phos in range.  Nutrition - On tube feeds with ongoing nutritional supplementation.  Resp failure/ sp trach - off the vent today. Per CCM.  Atrial fib - a/c on hold, off BB, in NSR    Rob Jonnie Finner, MD 09/09/2021, 10:10 AM  Recent Labs  Lab 09/08/21 0255 09/09/21 0303  HGB 9.7* 9.1*  ALBUMIN 2.9* 2.6*  CALCIUM 10.3 9.3  PHOS 4.9* 5.7*  CREATININE 6.35* 4.92*  K 6.6* 4.8    Inpatient medications:  amantadine  200 mg Per Tube Weekly   calcitRIOL  1 mcg Per Tube Q M,W,F   chlorhexidine  15 mL Mouth Rinse BID   Chlorhexidine Gluconate Cloth  6 each Topical Q0600    Chlorhexidine Gluconate Cloth  6 each Topical Q0600   darbepoetin (ARANESP) injection - DIALYSIS  100 mcg Intravenous Q Wed-HD   feeding supplement (PROSource TF)  45 mL Per Tube TID   fiber  1 packet Per Tube BID   heparin injection (subcutaneous)  5,000 Units Subcutaneous Q8H   insulin aspart  0-9 Units Subcutaneous Q4H   insulin aspart  6 Units Subcutaneous Q4H   insulin glargine-yfgn  15 Units Subcutaneous Daily   mouth rinse  15 mL Mouth Rinse q12n4p   multivitamin  1 tablet Per Tube QHS   pantoprazole sodium  40 mg Per Tube Daily    sodium chloride 0 mL/hr at 09/06/21 1141   ceFEPime (MAXIPIME) IV     feeding supplement (OSMOLITE 1.5 CAL) 50 mL/hr at 09/09/21 0800   acetaminophen **OR** acetaminophen (TYLENOL) oral liquid 160 mg/5 mL **OR** acetaminophen, diphenhydrAMINE, fentaNYL (SUBLIMAZE) injection, Gerhardt's butt cream, ipratropium-albuterol, labetalol, ondansetron (ZOFRAN) IV, polyvinyl alcohol, senna-docusate     Rob Zury Fazzino 09/12/2021, 11:00 AM   Recent Labs  Lab 09/11/21 0629 09/12/21 0223  HGB 10.6* 9.3*  ALBUMIN 3.0* 2.7*  CALCIUM 9.1 9.3  PHOS 3.5 6.2*  CREATININE 3.68* 5.26*  K 5.4* 5.4*    Inpatient medications:  amantadine  200 mg Per Tube Weekly   amLODipine  10 mg Per Tube Daily   calcitRIOL  1 mcg Per Tube Q M,W,F   chlorhexidine  15 mL Mouth Rinse BID   Chlorhexidine Gluconate Cloth  6 each Topical Q0600   darbepoetin (ARANESP) injection - DIALYSIS  100 mcg Intravenous Q Fri-HD   feeding supplement (PROSource TF)  45 mL Per Tube TID   fiber  1 packet Per Tube BID   heparin injection (subcutaneous)  5,000 Units Subcutaneous Q8H   insulin aspart  3-9 Units Subcutaneous Q4H   insulin aspart  6 Units Subcutaneous Q4H   insulin glargine-yfgn  15 Units Subcutaneous BID   losartan  25 mg Per Tube Daily   mouth rinse  15 mL Mouth Rinse q12n4p   multivitamin  1 tablet Per Tube QHS   pantoprazole sodium  40 mg Per Tube Daily    sodium chloride 0  mL/hr at 09/06/21 1141   ceFEPime (MAXIPIME) IV Stopped (09/11/21 1801)   feeding supplement (NEPRO CARB STEADY) 1,000 mL (09/12/21 0713)   acetaminophen **OR** acetaminophen (TYLENOL) oral liquid 160 mg/5 mL **OR** acetaminophen, diphenhydrAMINE, fentaNYL (SUBLIMAZE) injection, Gerhardt's butt cream, ipratropium-albuterol, labetalol, ondansetron (ZOFRAN) IV, polyvinyl alcohol, senna-docusate

## 2021-09-13 DIAGNOSIS — I629 Nontraumatic intracranial hemorrhage, unspecified: Secondary | ICD-10-CM | POA: Diagnosis not present

## 2021-09-13 DIAGNOSIS — J9601 Acute respiratory failure with hypoxia: Secondary | ICD-10-CM | POA: Diagnosis not present

## 2021-09-13 LAB — RENAL FUNCTION PANEL
Albumin: 2.7 g/dL — ABNORMAL LOW (ref 3.5–5.0)
Anion gap: 16 — ABNORMAL HIGH (ref 5–15)
BUN: 86 mg/dL — ABNORMAL HIGH (ref 6–20)
CO2: 27 mmol/L (ref 22–32)
Calcium: 9.7 mg/dL (ref 8.9–10.3)
Chloride: 91 mmol/L — ABNORMAL LOW (ref 98–111)
Creatinine, Ser: 7.22 mg/dL — ABNORMAL HIGH (ref 0.44–1.00)
GFR, Estimated: 6 mL/min — ABNORMAL LOW (ref >60)
Glucose, Bld: 148 mg/dL — ABNORMAL HIGH (ref 70–99)
Phosphorus: 7.4 mg/dL — ABNORMAL HIGH (ref 2.5–4.6)
Potassium: 5.4 mmol/L — ABNORMAL HIGH (ref 3.5–5.1)
Sodium: 134 mmol/L — ABNORMAL LOW (ref 135–145)

## 2021-09-13 LAB — GLUCOSE, CAPILLARY
Glucose-Capillary: 107 mg/dL — ABNORMAL HIGH (ref 70–99)
Glucose-Capillary: 158 mg/dL — ABNORMAL HIGH (ref 70–99)
Glucose-Capillary: 145 mg/dL — ABNORMAL HIGH (ref 70–99)
Glucose-Capillary: 144 mg/dL — ABNORMAL HIGH (ref 70–99)
Glucose-Capillary: 130 mg/dL — ABNORMAL HIGH (ref 70–99)

## 2021-09-13 LAB — CBC
HCT: 28.4 % — ABNORMAL LOW (ref 36.0–46.0)
Hemoglobin: 9.2 g/dL — ABNORMAL LOW (ref 12.0–15.0)
MCH: 24.9 pg — ABNORMAL LOW (ref 26.0–34.0)
MCHC: 32.4 g/dL (ref 30.0–36.0)
MCV: 77 fL — ABNORMAL LOW (ref 80.0–100.0)
Platelets: 177 10*3/uL (ref 150–400)
RBC: 3.69 MIL/uL — ABNORMAL LOW (ref 3.87–5.11)
RDW: 16.7 % — ABNORMAL HIGH (ref 11.5–15.5)
WBC: 10 10*3/uL (ref 4.0–10.5)
nRBC: 0 % (ref 0.0–0.2)

## 2021-09-13 MED ORDER — CARVEDILOL 3.125 MG PO TABS
6.2500 mg | ORAL_TABLET | Freq: Two times a day (BID) | ORAL | Status: DC
Start: 2021-09-13 — End: 2021-09-13

## 2021-09-13 MED ORDER — CALCIUM ACETATE (PHOS BINDER) 667 MG/5ML PO SOLN
667.0000 mg | Freq: Three times a day (TID) | ORAL | Status: DC
Start: 2021-09-13 — End: 2021-09-14
  Administered 2021-09-13 – 2021-09-14 (×3): 667 mg
  Filled 2021-09-13 (×4): qty 5

## 2021-09-13 MED ORDER — SODIUM ZIRCONIUM CYCLOSILICATE 10 G PO PACK
10.0000 g | PACK | Freq: Once | ORAL | Status: AC
Start: 2021-09-13 — End: 2021-09-13
  Administered 2021-09-13: 10 g

## 2021-09-13 MED ORDER — HYDRALAZINE HCL 50 MG PO TABS
50.0000 mg | ORAL_TABLET | Freq: Three times a day (TID) | ORAL | Status: DC
Start: 2021-09-13 — End: 2021-09-13

## 2021-09-13 MED ORDER — CARVEDILOL 6.25 MG PO TABS
6.2500 mg | ORAL_TABLET | Freq: Two times a day (BID) | ORAL | Status: DC
Start: 2021-09-13 — End: 2021-09-26
  Administered 2021-09-13 – 2021-09-26 (×19): 6.25 mg via NASOGASTRIC
  Filled 2021-09-13 (×11): qty 1
  Filled 2021-09-13: qty 2
  Filled 2021-09-13 (×7): qty 1

## 2021-09-13 MED ORDER — HYDRALAZINE HCL 25 MG PO TABS
25.0000 mg | ORAL_TABLET | Freq: Three times a day (TID) | ORAL | Status: DC
Start: 2021-09-13 — End: 2021-09-14
  Administered 2021-09-14 (×2): 25 mg
  Filled 2021-09-13 (×3): qty 1

## 2021-09-13 MED ORDER — CALCIUM ACETATE (PHOS BINDER) 667 MG PO CAPS
667.0000 mg | ORAL_CAPSULE | Freq: Three times a day (TID) | ORAL | Status: DC
  Filled 2021-09-13 (×2): qty 1

## 2021-09-13 MED ORDER — SODIUM ZIRCONIUM CYCLOSILICATE 10 G PO PACK
10.0000 g | PACK | Freq: Once | ORAL | Status: DC
  Filled 2021-09-13: qty 1

## 2021-09-13 NOTE — Progress Notes (Signed)
[facility] Progress Note  Subjective:  Last HD on 6/16 with 1.9 kg UF.  No urgent events overnight per nursing.     Review of systems:  Unable to obtain secondary to reduced responsiveness and trach  Vitals:   09/13/21 0400 09/13/21 0500 09/13/21 0600 09/13/21 0700  BP: (!) 155/83 (!) 166/82 (!) 146/76 (!) 164/84  Pulse: 87 85 78 80  Resp: '20 17 16 20  '$ Temp: 99.2 F (37.3 C)     TempSrc: Axillary     SpO2: 98% 97% 96% 96%  Weight:  99.7 kg    Height:        Exam:    Adult female in bed in NAD Trach in place, off the vent no jvd Chest cta bilat S1S2 no rub Abd soft ntnd  Ext no LE edema appreciated Neuro - no continuous sedation - minimally interactive  LUE AVF +bruit   Home meds include - asa, tylenol, renavite, cadexomer iodine, phoslo, ceterizine, cholestyramine, hydralazine 10 bid, labetalol 200 bid, losartan 100, psyllium     OP HD: [facility] MWF  4h  97.5kg  450/500  2/2.5 bath  15ga  AVF  Hep none - mircera 100 ug q2 - rocaltrol 1.0 mcg po tiw   Assessment/ Plan Right thalamic intracranial hemorrhage - w/ intraventricular hemorrhage and hydrocephalus. SP ventriculostomy drain placement, then VP shunt surgery was done on 6/14. Per neurosurgery.  ESRD - is on MWF HD schedule.  HD today.  Await labs - renal panel pending.  Note hyperkalemia - may be secondary to reduced clearance.  Lokelma today.  Volume/ HTN - has been getting about 2 kg UF off per tx recently.  BP's were up so losartan and norvasc were resumed.  Anemia CKD - Hb 9-10 range, got aranesp on 6/17 at 100 mcg.  Is ordered for 100 mcg weekly on Friday   CKD-MBD - On calcitriol.  Hyperphos - resume home phoslo (though no home dosing given).  Watch calcium. On nepro.  Nutrition - On tube feeds with ongoing nutritional supplementation.  Acute hypoxic resp failure/ sp trach - off the vent. Per CCM.  Atrial fib - a/c on hold, off BB, in NSR   Recent Labs  Lab 09/08/21 0255  09/09/21 0303  HGB 9.7* 9.1*  ALBUMIN 2.9* 2.6*  CALCIUM 10.3 9.3  PHOS 4.9* 5.7*  CREATININE 6.35* 4.92*  K 6.6* 4.8    Inpatient medications:  amantadine  200 mg Per Tube Weekly   calcitRIOL  1 mcg Per Tube Q M,W,F   chlorhexidine  15 mL Mouth Rinse BID   Chlorhexidine Gluconate Cloth  6 each Topical Q0600   Chlorhexidine Gluconate Cloth  6 each Topical Q0600   darbepoetin (ARANESP) injection - DIALYSIS  100 mcg Intravenous Q Wed-HD   feeding supplement (PROSource TF)  45 mL Per Tube TID   fiber  1 packet Per Tube BID   heparin injection (subcutaneous)  5,000 Units Subcutaneous Q8H   insulin aspart  0-9 Units Subcutaneous Q4H   insulin aspart  6 Units Subcutaneous Q4H   insulin glargine-yfgn  15 Units Subcutaneous Daily   mouth rinse  15 mL Mouth Rinse q12n4p   multivitamin  1 tablet Per Tube QHS   pantoprazole sodium  40 mg Per Tube Daily    sodium chloride 0 mL/hr at 09/06/21 1141   ceFEPime (MAXIPIME) IV     feeding supplement (OSMOLITE 1.5 CAL) 50 mL/hr at 09/09/21 0800   acetaminophen **OR** acetaminophen (TYLENOL)  oral liquid 160 mg/5 mL **OR** acetaminophen, diphenhydrAMINE, fentaNYL (SUBLIMAZE) injection, Gerhardt's butt cream, ipratropium-albuterol, labetalol, ondansetron (ZOFRAN) IV, polyvinyl alcohol, senna-docusate     Recent Labs  Lab 09/11/21 0629 09/12/21 0223  HGB 10.6* 9.3*  ALBUMIN 3.0* 2.7*  CALCIUM 9.1 9.3  PHOS 3.5 6.2*  CREATININE 3.68* 5.26*  K 5.4* 5.4*   Inpatient medications:  amantadine  200 mg Per Tube Weekly   amLODipine  10 mg Per Tube Daily   calcitRIOL  1 mcg Per Tube Q M,W,F   chlorhexidine  15 mL Mouth Rinse BID   Chlorhexidine Gluconate Cloth  6 each Topical Q0600   darbepoetin (ARANESP) injection - DIALYSIS  100 mcg Intravenous Q Fri-HD   feeding supplement (PROSource TF)  45 mL Per Tube TID   fiber  1 packet Per Tube BID   heparin injection (subcutaneous)  5,000 Units Subcutaneous Q8H   insulin aspart  0-9 Units  Subcutaneous Q4H   insulin glargine-yfgn  15 Units Subcutaneous BID   losartan  25 mg Per Tube Daily   mouth rinse  15 mL Mouth Rinse q12n4p   multivitamin  1 tablet Per Tube QHS   pantoprazole sodium  40 mg Per Tube Daily    sodium chloride 0 mL/hr at 09/06/21 1141   ceFEPime (MAXIPIME) IV Stopped (09/12/21 1730)   feeding supplement (NEPRO CARB STEADY) 50 mL/hr at 09/13/21 0700   acetaminophen **OR** acetaminophen (TYLENOL) oral liquid 160 mg/5 mL **OR** acetaminophen, diphenhydrAMINE, fentaNYL (SUBLIMAZE) injection, Gerhardt's butt cream, ipratropium-albuterol, labetalol, ondansetron (ZOFRAN) IV, polyvinyl alcohol, senna-docusate   Claudia Desanctis, MD 09/13/2021 7:38 AM

## 2021-09-13 NOTE — Progress Notes (Signed)
NAME:  Janet Mitchell, MRN:  [id number], DOB:  1965-06-15, LOS: 21 ADMISSION DATE:  08/23/2021, CONSULTATION DATE:  08/23/2021 REFERRING MD:  Dr. Rory Percy - Neuro CHIEF COMPLAINT: Code Stroke  History of Present Illness:  56 year old woman who presented to [facility] 5/29 with reported left-sided weakness, slurred speech, nausea/vomiting. LKN 6 hours prior to arrival. PMHx significant for HTN, CHF (Echo 07/2021 EF 60-65%, severe LVH, G2DD), T2DM (c/b diabetic neuropathy with subsequent L great toe amputation 2/2 ulcer/infection), ESRD (on HD MWF via RIJ TDC).  On arrival to ED, patient is somnolent with slight disconjugate gaze with near flaccid left upper arm and left leg weakness. BP 222/107. CT Head with 5cc acute hematoma in the right thalamus with intraventricular extension. Neurology consulted and patient transferred to [facility] for further evaluation. PCCM consulted for admission.  Required EVD placement 5/29 for mild hydrocephalus. Tracheostomy placed 6/2 by PCCM. Persistent hydrocephalus with failed EVD clamping trials necessitating VP shunt creation 6/14. Remains stable from a respiratory standpoint (ATC) post-procedure.  Pertinent Medical History:  ESRD with HD MWF, CHF, HTN, DM, s/p left great toe amputation secondary to ulcer/infection   Significant Hospital Events: Including procedures, antibiotic start and stop dates in addition to other pertinent events   5/28 Presents to [facility] ED, intubated. 5/29 Transferred to [facility]. EVD placed. 5/30 HD, more awake, following commands with RUE/RLE 6/1 No issues overnight, she remains alert and interactive on vent.  Extubated 6/2 Failed extubation requiring reintubation. Trached. 6/4 Placed on ATC yesterday afternoon and has tolerated well since 6/5 EVD raised to 20 cm H20 6/7 Failed EVD clamping trial after developing emesis and headache 6/8 Failed EVD clamping due to emesis 6/9 Increase EVD pressure to 25 6/12 Back on vent due to hypoxia from mucus  plugging, started back on antibiotics 6/14 VP shunt placed. Antibiotics adjusted based on cultures 6/17 Off the vent since 6/16, did not require to be on overnight 6/19 Remains on ATC, FiO2 28%. Moderate-thick secretions.  Interim History / Subjective:  No significant events overnight Continues to tolerate trach collar without issue FiO2 weaned from 35% to 28% today Moderate-thick clear-yellow secretions, patient is managing these well Strong cough, clearing secretions adequately  Objective:  Blood pressure (!) 164/84, pulse 80, temperature 99.2 F (37.3 C), temperature source Axillary, resp. rate 20, height '5\' 7"'$  (1.702 m), weight 99.7 kg, SpO2 96 %.    FiO2 (%):  [35 %] 35 %   Intake/Output Summary (Last 24 hours) at 09/13/2021 0748 Last data filed at 09/13/2021 0700 Gross per 24 hour  Intake 1530 ml  Output 400 ml  Net 1130 ml    Filed Weights   09/11/21 0440 09/12/21 0500 09/13/21 0500  Weight: 96.6 kg 98.7 kg 99.7 kg   Physical Examination: General: Chronically ill-appearing middle-aged woman in NAD. Appears mildly uncomfortable. HEENT: [place]/AT, anicteric sclera, PERRL, moist mucous membranes. Neck: Trachestomy (6 Shiley cuffed) in place, midline with moderate-thick clear-yellow secretions.  Neuro:  Awake, nodding appropriately to questions. Will intermittently track to provider.  Responds to verbal stimuli. Following commands intermittently. Unilateral L-sided neglect noted.  +Cough and +Gag  CV: RRR, no m/g/r. PULM: Breathing even and unlabored on trach collar (ATC 28%). Lung fields with coarse rhonchi to upper fields, improving with strong cough. GI: Soft, nontender, nondistended. Normoactive bowel sounds. Extremities: Trace bilateral symmetric LE edema noted. L great toe surgically absent. Skin: Warm/dry, no rashes.  Assessment & Plan:   Acute respiratory failure in the setting of ICH S/p tracheostomy  creation 6/2 Pneumonia 2/2 enterobacter, staph aureus - Off of  vent since 6/16, tolerating ATC - Continue supplemental O2 support via ATC - Wean FiO2 for O2 sat > 90% - Pulmonary hygiene - Routine trach care, last exchanged 6/16 - Continue cefepime for +Resp Cx, end 6/21 - PCCM will continue to follow weekly for tracheostomy management  Intracranial hemorrhage with dense left hemiparesis Large right thalamic bleed Obstructive hydrocephalus S/p VP shunt placement - NSGY continues to follow for postoperative VP shunt management  Hypertensive emergency - Continue amlodipine, losartan - Labetalol PRN  End-stage renal disease on hemodialysis - Nephro following - HD schedule per Nephro - Trend BMP - Replete electrolytes as indicated - Monitor I&Os - Avoid nephrotoxic agents as able  Chronic anemia due to end-stage renal disease - Continue iron supplementation  Hyperglycemia - SSI - Goal CBGs 140-180  Primary medical management per TRH; stable for transfer out of ICU.  PCCM will continue to follow weekly for tracheostomy care. Please contact use if patient's needs change or if we can be of further assistance.  Best Practice: (right click and "Reselect all SmartList Selections" daily)   Diet/type: tubefeeds DVT prophylaxis: Heparin tid GI prophylaxis: PPI Lines: N/A Foley:  N/A Code Status:  full code Last date of multidisciplinary goals of care discussion [6/14]  Critical care time: N/A   Rhae Lerner [place] [facility] 09/13/21 7:49 AM  Please see [email or web address] for pager details.  From 7A-7P if no response, please call [phone] After hours, please call ELink [phone]

## 2021-09-13 NOTE — Progress Notes (Signed)
Subjective: The patient is alert.  She is in no apparent distress.  Objective: Vital signs in last 24 hours: Temp:  [99.2 F (37.3 C)-100.3 F (37.9 C)] 99.2 F (37.3 C) (06/19 0400) Pulse Rate:  [78-101] 80 (06/19 0700) Resp:  [0-27] 20 (06/19 0700) BP: (104-166)/(52-91) 164/84 (06/19 0700) SpO2:  [93 %-100 %] 96 % (06/19 0700) FiO2 (%):  [35 %] 35 % (06/19 0236) Weight:  [99.7 kg] 99.7 kg (06/19 0500) Estimated body mass index is 34.43 kg/m as calculated from the following:   Height as of this encounter: '5\' 7"'$  (1.702 m).   Weight as of this encounter: 99.7 kg.   Intake/Output from previous day: 06/18 0701 - 06/19 0700 In: 1530 [NG/GT:1430; IV Piggyback:100] Out: 400 [Stool:400] Intake/Output this shift: No intake/output data recorded.  Physical exam the patient is intubated and alert.  She follows commands on the right.  She is left hemiplegic.  Her shunt incisions are healing well.  Lab Results: Recent Labs    09/12/21 0223 09/13/21 0653  WBC 10.1 10.0  HGB 9.3* 9.2*  HCT 29.3* 28.4*  PLT 185 177   BMET Recent Labs    09/12/21 0223 09/13/21 0653  NA 134* 134*  K 5.4* 5.4*  CL 93* 91*  CO2 29 27  GLUCOSE 133* 148*  BUN 56* 86*  CREATININE 5.26* 7.22*  CALCIUM 9.3 9.7    Studies/Results: No results found.  Assessment/Plan: Status post VP shunt: The patient is stable.  Her wounds are healing well.  I will sign off.  Please call if I can be of further assistance.  LOS: 21 days     Ophelia Charter 09/13/2021, 7:58 AM     Patient ID: Janet Mitchell, female   DOB: Aug 08, 1965, 56 y.o.   MRN: [id number]

## 2021-09-13 NOTE — Progress Notes (Signed)
PROGRESS NOTE                                                                                                                                                                                                             Patient Demographics:    Janet Mitchell, is a 56 y.o. female, DOB - 03/15/1966, IRJ:[id number]  Outpatient Primary MD for the patient is Lindell Spar, MD    LOS - 21  Admit date - 08/23/2021    Chief Complaint  Patient presents with   Code Stroke       Brief Narrative (HPI from H&P)  56 y.o. female with history of ESRD on HD MWF, CHF, poorly controlled HTN, DM presenting with nausea, vomiting, left side weakness present upon waking.  Scented to [facility] on 08/23/2021 with nausea vomiting left-sided weakness, work-up showed right-sided thalamic hemorrhage she was intubated and transferred to [facility] under ICU care for further treatment.  She was seen by [facility], nephrology for HD, neurology and neurosurgery, external ventricular drain placement by neurosurgery on 08/23/2021.  She also was intubated, trached, during her hospital stay she had tracheostomy site infection for which she is on antibiotic.  She has been transferred to hospitalist service on 09/13/2021 on day 20 one of her hospital stay with plan of going to [place].   Events procedures  5/28 > Presents to [facility] ED, intubated. 5/29 > Transferred to [facility] Echocardiogram with EF 60%, no acute changes. 5/30 > HD, more awake, following commands with RUE/RLE 6/1 No issues overnight, she remains alert and interactive on vent.  Extubated 6/2 failed extubation requiring reintubation. Trached. 6/4 Placed on ATC yesterday afternoon and has tolerated well since 6/5: EVD raised to 20 cm H20 6/7: Failed EVD clamping trial after developing emesis and headache 6/8: Failed EVD clamping due to emesis 6/9: Increase EVD pressure to 25 6/12 back on vent  due to hypoxia from mucus plugging, started back on antibiotics 6/14 VP shunt placed. Antibiotics adjusted based on cultures 6/17-has been off the vent since 09/10/2021, did not require to be on overnight. 6/19.  Transferred to hospitalist service    Subjective:    Jonel Sick [place] today in bed, weak or shortness of breath.  Answers with nods of head.   Assessment  & Plan :   Right thalamic  intracranial hemorrhage extending into right midbrain and intraventricular hemorrhage with mild obstructive hydrocephalus requiring external ventricular drain placement by neurosurgery on 08/23/2021 and subsequent VP shunt placement on 09/08/2021.  Hemorrhage was due to uncontrolled hypertension.  Brain bleed contributing to severe left-sided weakness, dysphagia along with toxic encephalopathy.  She has been seen by neurosurgery, neurology, she is currently trached with ongoing left-sided weakness, NG tube feeds, case discussed with neurologist Dr. Leonie Man on 09/13/2021.  Currently goal is supportive care, keep blood pressure systolic under 932, arrange for LTAC placement.  No antithrombotics.   2.  Inability to maintain airway with acute nonspecific respiratory failure due to #1 above requiring intubation, tracheostomy.  Now with trach collar with tracheostomy site infection.  Currently on antibiotics for treatment of Enterobacter and Staph aureus, antibiotic stop date 09/15/2021 per PCCM team.  Tracheostomy deferred to PCCM.  3.  Dysphagia and left-sided hemiparesis due to #1 above.  Supportive care, LTAC/CIR, currently NG tube feeds, PEG tube to be placed by surgical team due to variation in anatomy and VP shunt placement.  4.  ESRD.  Nephrology following.  On MWF schedule.  5.  Poorly controlled blood pressure.  Case discussed with neurology on 09/13/2021, goal SBP under 160, medications adjusted on 09/13/2021, ARB discontinued due to ESRD and hyperkalemia.  6.  Anemia of chronic disease due to ESRD.   Supportive care.  7.  DM type II.  Lantus + Sliding scale.  Monitor.  Lab Results  Component Value Date   HGBA1C 6.2 (H) 08/23/2021   CBG (last 3)  Recent Labs    09/12/21 2356 09/13/21 0354 09/13/21 0737  GLUCAP 175* 107* 158*         Condition - Extremely Guarded  Family Communication  : Husband Joneen Boers [phone] on 09/13/2021 at 9:13 AM  Code Status : Full  Consults  : Neurology, neurosurgery, renal  PUD Prophylaxis : PPI       Disposition Plan  :    Status is: Inpatient  DVT Prophylaxis  :    heparin injection 5,000 Units Start: 09/09/21 0600 SCD's Start: 08/23/21 0925   Lab Results  Component Value Date   PLT 177 09/13/2021    Diet :  Diet Order     None        Inpatient Medications  Scheduled Meds:  amantadine  200 mg Per Tube Weekly   amLODipine  10 mg Per Tube Daily   calcitRIOL  1 mcg Per Tube Q M,W,F   calcium acetate  667 mg Oral TID WC   carvedilol  6.25 mg Per NG tube BID WC   chlorhexidine  15 mL Mouth Rinse BID   Chlorhexidine Gluconate Cloth  6 each Topical Q0600   darbepoetin (ARANESP) injection - DIALYSIS  100 mcg Intravenous Q Fri-HD   feeding supplement (PROSource TF)  45 mL Per Tube TID   fiber  1 packet Per Tube BID   heparin injection (subcutaneous)  5,000 Units Subcutaneous Q8H   hydrALAZINE  50 mg Per Tube Q8H   insulin aspart  0-9 Units Subcutaneous Q4H   insulin glargine-yfgn  15 Units Subcutaneous BID   mouth rinse  15 mL Mouth Rinse q12n4p   multivitamin  1 tablet Per Tube QHS   pantoprazole sodium  40 mg Per Tube Daily   Continuous Infusions:  sodium chloride 0 mL/hr at 09/06/21 1141   ceFEPime (MAXIPIME) IV Stopped (09/12/21 1730)   feeding supplement (NEPRO CARB STEADY) 50 mL/hr at 09/13/21 0800  PRN Meds:.acetaminophen **OR** acetaminophen (TYLENOL) oral liquid 160 mg/5 mL **OR** acetaminophen, diphenhydrAMINE, fentaNYL (SUBLIMAZE) injection, Gerhardt's butt cream, ipratropium-albuterol, labetalol,  ondansetron (ZOFRAN) IV, polyvinyl alcohol, senna-docusate  Time Spent in minutes  30   Lala Lund M.D on 09/13/2021 at 8:56 AM  To page go to [email or web address]   [facility] -  Office  [phone]  See all Orders from today for further details    Objective:   Vitals:   09/13/21 0600 09/13/21 0700 09/13/21 0723 09/13/21 0800  BP: (!) 146/76 (!) 164/84 (!) 164/84 (!) 168/80  Pulse: 78 80 78 79  Resp: '16 20 16 16  '$ Temp:      TempSrc:      SpO2: 96% 96% 97% 94%  Weight:      Height:        Wt Readings from Last 3 Encounters:  09/13/21 99.7 kg  06/03/21 99.1 kg  08/04/20 95.2 kg     Intake/Output Summary (Last 24 hours) at 09/13/2021 0856 Last data filed at 09/13/2021 0800 Gross per 24 hour  Intake 1580 ml  Output 400 ml  Net 1180 ml     Physical Exam  Awake, nods head to basic questions, moves right-sided extremities, dense left-sided hemiparesis with some early contractures developing, NG tube in place, trach collar in place [place]. Supple Neck, No JVD,   Symmetrical Chest wall movement, Good air movement bilaterally, CTAB RRR,No Gallops,Rubs or new Murmurs,  +ve B.Sounds, Abd Soft, No tenderness,   No Cyanosis,     Data Review:    CBC Recent Labs  Lab 09/09/21 0303 09/10/21 0547 09/11/21 0629 09/12/21 0223 09/13/21 0653  WBC 10.0 8.6 9.0 10.1 10.0  HGB 9.1* 8.8* 10.6* 9.3* 9.2*  HCT 27.8* 27.0* 33.4* 29.3* 28.4*  PLT 200 146* 162 185 177  MCV 77.4* 76.5* 78.6* 77.3* 77.0*  MCH 25.3* 24.9* 24.9* 24.5* 24.9*  MCHC 32.7 32.6 31.7 31.7 32.4  RDW 16.4* 16.2* 16.7* 16.5* 16.7*  LYMPHSABS 1.1 1.8  --   --   --   MONOABS 0.6 0.8  --   --   --   EOSABS 0.1 0.4  --   --   --   BASOSABS 0.1 0.1  --   --   --     Electrolytes Recent Labs  Lab 09/06/21 1021 09/06/21 1343 09/07/21 0545 09/08/21 0255 09/09/21 0303 09/10/21 0547 09/11/21 0629 09/12/21 0223 09/13/21 0653  NA  --   --  131*   < > 130* 135 133* 134* 134*  K  --   --  4.4   < >  4.8 5.7* 5.4* 5.4* 5.4*  CL  --   --  87*   < > 89* 92* 92* 93* 91*  CO2  --   --  24   < > '26 23 22 29 27  '$ GLUCOSE  --   --  110*   < > 235* 215* 134* 133* 148*  BUN  --   --  65*   < > 51* 76* 32* 56* 86*  CREATININE  --   --  5.95*   < > 4.92* 6.93* 3.68* 5.26* 7.22*  CALCIUM  --   --  9.9   < > 9.3 9.8 9.1 9.3 9.7  ALBUMIN  --   --  2.8*   < > 2.6* 2.9* 3.0* 2.7* 2.7*  MG  --   --  2.0  --   --   --   --   --   --  LATICACIDVEN 1.5 1.4  --   --   --   --   --   --   --    < > = values in this interval not displayed.    ------------------------------------------------------------------------------------------------------------------ No results for input(s): "CHOL", "HDL", "LDLCALC", "TRIG", "CHOLHDL", "LDLDIRECT" in the last 72 hours.  Lab Results  Component Value Date   HGBA1C 6.2 (H) 08/23/2021    ID Labs Recent Labs  Lab 09/06/21 1021 09/06/21 1343 09/07/21 0545 09/09/21 0303 09/10/21 0547 09/11/21 0629 09/12/21 0223 09/13/21 0653  WBC  --   --    < > 10.0 8.6 9.0 10.1 10.0  PLT  --   --    < > 200 146* 162 185 177  LATICACIDVEN 1.5 1.4  --   --   --   --   --   --   CREATININE  --   --    < > 4.92* 6.93* 3.68* 5.26* 7.22*   < > = values in this interval not displayed.     Radiology Reports No results found.

## 2021-09-13 NOTE — Progress Notes (Signed)
MD hand written change in orders on printed out HD order sheet changing bath order to run on a 1K+ bath for first 30 min of tx, then change to 2K+ for remainder of tx. Was going to modify the orders in Epic to reflect these hand written changes, however, one of the primary nurse's from either yesterday or today "completed" the order on their work list so I was not able to modify the order.

## 2021-09-13 NOTE — TOC Progression Note (Signed)
Transition of Care [facility]) - Progression Note    Patient Details  Name: Janet Mitchell MRN: [id number] Date of Birth: [birth year]  [facility] CM/SW Contact  Ella Bodo, RN Phone Number: 09/13/2021, 4:38 PM  Clinical Narrative:    Patient continues to make progress s/p stroke with tracheostomy and VP shunt placement.  Long-term acute care hospital being considered by patient's family.  Spoke with patient's husband, Janet Mitchell, who is open to considering LTAC prior to possible inpatient rehab or home discharge, depending on progress.  [facility] currently has no trach-dialysis beds.  Husband  agreeable to speak with [facility] admissions coordinator to answer any questions he may have.  Jennifer with [facility] to follow up with husband on 09/14/2021.     Expected Discharge Plan: Long Term Acute Care (LTAC) Barriers to Discharge: Insurance Authorization, Continued Medical Work up  Expected Discharge Plan and Services Expected Discharge Plan: [place] (LTAC) In-house Referral: Clinical Social Work Discharge Planning Services: CM Consult   Living arrangements for the past 2 months: Single Family Home                                       Social Determinants of Health (SDOH) Interventions    Readmission Risk Interventions    07/19/2019    1:50 PM 07/12/2019    4:57 PM 04/05/2019    2:18 PM  Readmission Risk Prevention Plan  Transportation Screening Complete Complete Complete  Medication Review (RN Care Manager) Complete  Complete  PCP or Specialist appointment within 3-5 days of discharge Patient refused    [place] or [place] Complete    Palliative Care Screening Not Applicable  Not [place] Not Applicable Not Applicable Patient Refused   Reinaldo Raddle, RN, BSN  Trauma/Neuro ICU Case Manager [phone]

## 2021-09-13 NOTE — Progress Notes (Signed)
Inpatient Rehab Admissions Coordinator:    Pt. Is not yet medically stable for CIR and is not currently tolerating therapy well enough that I believe she could tolerate CIR. Appears likely to need LTACH. I will follow at a distance in case tolerance improves.  Clemens Catholic, [place], [place] Admissions Coordinator  [phone] ([place]) [phone] (office)

## 2021-09-13 NOTE — Progress Notes (Signed)
STROKE TEAM PROGRESS NOTE   INTERVAL HISTORY Patient is sleepy but can be aroused. Still on trach collar, lots of secretions.  Follows commands. Left arm with increased tone. Hemiplegic on left side, Withdraws to noxious stimuli on left side. Right arm and leg 4/5. Vitals are stable. New neurological events. No family at bedside.  Appreciate hospitalist team to have assumed care of this patient.  No family at the bedside vital signs stable.  Neurological exam unchanged tomorrow.    Vitals:   09/13/21 1137 09/13/21 1152 09/13/21 1200 09/13/21 1300  BP: (!) 159/88  (!) 144/76 (!) 170/81  Pulse: 85 83 82 83  Resp: (!) '22 19 17 '$ (!) 21  Temp:  98.4 F (36.9 C)    TempSrc:  Axillary    SpO2: 96% 94% 94% 95%  Weight:      Height:       CBC:  Recent Labs  Lab 09/09/21 0303 09/10/21 0547 09/11/21 0629 09/12/21 0223 09/13/21 0653  WBC 10.0 8.6   < > 10.1 10.0  NEUTROABS 7.8* 5.3  --   --   --   HGB 9.1* 8.8*   < > 9.3* 9.2*  HCT 27.8* 27.0*   < > 29.3* 28.4*  MCV 77.4* 76.5*   < > 77.3* 77.0*  PLT 200 146*   < > 185 177   < > = values in this interval not displayed.   Basic Metabolic Panel:  Recent Labs  Lab 09/07/21 0545 09/08/21 0255 09/12/21 0223 09/13/21 0653  NA 131*   < > 134* 134*  K 4.4   < > 5.4* 5.4*  CL 87*   < > 93* 91*  CO2 24   < > 29 27  GLUCOSE 110*   < > 133* 148*  BUN 65*   < > 56* 86*  CREATININE 5.95*   < > 5.26* 7.22*  CALCIUM 9.9   < > 9.3 9.7  MG 2.0  --   --   --   PHOS 2.8   < > 6.2* 7.4*   < > = values in this interval not displayed.     IMAGING past 24 hours No results found.  PHYSICAL EXAM  Physical Exam  Constitutional: Appears well-developed and well-nourished middle-age African-American lady.  Cardiovascular: Normal rate and regular rhythm.  Respiratory: Effort normal, non-labored breathing, on trach via vent  Neuro - awake, alert, eyes open, nonverbal on vent via trach, but following most simple commands.  Left gaze preference,  however able to cross midline with incomplete right gaze, blinking to visual threat bilaterally, PERRL.  Left facial droop. Tongue midline.  Left hemiplegia with increased tone, RUE and RLE 3/5. Sensation withdraws to noxious stimuli, right FTN no ataxia, gait not tested.    ASSESSMENT/PLAN [name] is a 56 y.o. female with history of ESRD on HD MWF, CHF, HTN, DM presenting with nausea, vomiting, left side weakness present upon waking. Intubated emergently at [facility] and transferred to [facility] on 5/59.  NSGY consulted, EVD placement 5/29. MRI shows stable size in right thalamic hemorrhage with edema extending to the pons and midbrain. EVD still in place. Extubated 08/26/2021. Reintubated 08/27/2021 and then tracheostomy placed. Consult for PEG tube sent to trauma team.   ICH:  Right thalamic ICH extending to right midbrain with IVH and mild hydrocephalus s/p EVD, likely secondary to uncontrolled hypertension Code Stroke CT head Right thalamic IVH previously 2.9 x 1.7 x 3.1 cm 5/29 Head CT 1000-  Interval significant increase in size of a an acute hematoma in the right thalamus, now measuring 3.6 x 2.5 x 3.8 cm. Small volume intraventricular extension of hemorrhage. Mild hydrocephalus. 5/29 Head CT 2200- decrease in size of lateral and third ventricles s/p EVD placement  Repeat CT head 6/12 -1. No significant interval change in the right thalamic hemorrhage with associated mass effect and mild compression of the third  ventricle. 2. Slight interval increase in the size of the lateral ventricles compared to CT of 09/03/2021 which may represent mild hydrocephalus. CTA head & neck No arterial lesion underlying the right thalamic hematoma. Equivocal for spot sign which would be an adverse finding suggesting continued growth MRI  no change in size of the right thalamic intraparenchymal hemorrhage extending to midbrain, maximal dimension 3.6 cm by MRI. Surrounding edema, likely slightly more prominent  ventricles are however similar in size to the CT of 08/23/2021. 2D Echo EF 60-65%, consistent with grade II diastolic dysfunction EEG - mild diffuse encephalopathy LDL 38 HgbA1c 6.2 VTE prophylaxis - SCDs aspirin 81 mg daily prior to admission, now on No antithrombotic.  Therapy recommendations:  CIR Disposition:  Pending  Obstructive Hydrocephalus NSGY on board 5/29- EVD placement  Follow up CT shows slightly decrease in size of lateral and third ventricle  Unable to tolerate clamping trial with nausea and vomiting.  S/p VP shunt 6/14  Cerebral edema Hyponatremia, improved  On 3% saline @ 75- d/c'd Given ESRD, off 3% saline Na 131-130-135-133-134  Acute Hypoxic Respiratory failure Intubated 5/29 CCM managing vent Extubated 6/1 Reintubated and s/p trach 6/2 Required being placed on ventilator 6/12. Unable to wean from vent a this time, attempt as tolerated and appropriate  On Zosyn for HCAP Has been on trach collar since 6/16 and tolerating well   Hypertensive emergency-resolved Home meds:  labetolol, hydralazine, losartan Stable BP less than 160 On losartan 100, labetalol 200 3 times daily, hydralazine 50 3 times daily,  amlodipine 10 Long-term BP goal normotensive cleviprex off  End Stage Renal Failure on hemodialysis MWF Nephrology consulted Monitor BMP/electrolytes  Diabetes Mellitus A1c 6.2, controlled CBGs SSI Close PCP follow-up  Dysphagia  cortrak placed on TF speech on board Per surgery, pt not candidate for PEG given recent VPS and anatomy. If need feeding tube, pt will need open abdomen surgery  Other Stroke Risk Factors Obesity, Body mass index is 34.43 kg/m., BMI >/= 30 associated with increased stroke risk, recommend weight loss, diet and exercise as appropriate  Congestive heart failure  Other Active Problems Hyperkalemia  K 6.1->4.2-> 3.5->6.6, emergent HD->5.7  Hospital day # 21 Continue ongoing therapies.  Mobilize out of bed.   Consider transfer to [facility] as patient cannot have a PEG tube for 4 to 6 weeks as per trauma team due to abdominal adhesions.  Continue nourishment and medications via Panda tube.  Discussed with Dr. Candiss Norse and answered questions.  No family available at the bedside today for discussion.    This patient is critically ill due to respiratory distress, hydrocephalus s/p EVD and now VPS, ICH and IVH, ESRD on HD, hyperkalemia and at significant risk of neurological worsening, death form hydrocephalus, respiratory failure, brain herniation, seizure, sepsis. This patient's care requires constant monitoring of vital signs, hemodynamics, respiratory and cardiac monitoring, review of multiple databases, neurological assessment, discussion with family, other specialists and medical decision making of high complexity. I spent 30 minutes of neurocritical care time in the care of this patient.  Antony Contras, MD To contact  Stroke Continuity provider, please refer to [email or web address] After hours, contact General Neurology

## 2021-09-14 ENCOUNTER — Inpatient Hospital Stay (HOSPITAL_COMMUNITY): Payer: 59

## 2021-09-14 ENCOUNTER — Ambulatory Visit: Payer: 59 | Admitting: Podiatry

## 2021-09-14 DIAGNOSIS — I629 Nontraumatic intracranial hemorrhage, unspecified: Secondary | ICD-10-CM | POA: Diagnosis not present

## 2021-09-14 LAB — RENAL FUNCTION PANEL
Albumin: 2.7 g/dL — ABNORMAL LOW (ref 3.5–5.0)
Anion gap: 14 (ref 5–15)
BUN: 37 mg/dL — ABNORMAL HIGH (ref 6–20)
CO2: 29 mmol/L (ref 22–32)
Calcium: 9.6 mg/dL (ref 8.9–10.3)
Chloride: 92 mmol/L — ABNORMAL LOW (ref 98–111)
Creatinine, Ser: 4.15 mg/dL — ABNORMAL HIGH (ref 0.44–1.00)
GFR, Estimated: 12 mL/min — ABNORMAL LOW (ref >60)
Glucose, Bld: 150 mg/dL — ABNORMAL HIGH (ref 70–99)
Phosphorus: 4.7 mg/dL — ABNORMAL HIGH (ref 2.5–4.6)
Potassium: 4.3 mmol/L (ref 3.5–5.1)
Sodium: 135 mmol/L (ref 135–145)

## 2021-09-14 LAB — GLUCOSE, CAPILLARY
Glucose-Capillary: 125 mg/dL — ABNORMAL HIGH (ref 70–99)
Glucose-Capillary: 129 mg/dL — ABNORMAL HIGH (ref 70–99)
Glucose-Capillary: 143 mg/dL — ABNORMAL HIGH (ref 70–99)
Glucose-Capillary: 149 mg/dL — ABNORMAL HIGH (ref 70–99)
Glucose-Capillary: 159 mg/dL — ABNORMAL HIGH (ref 70–99)
Glucose-Capillary: 164 mg/dL — ABNORMAL HIGH (ref 70–99)
Glucose-Capillary: 204 mg/dL — ABNORMAL HIGH (ref 70–99)

## 2021-09-14 IMAGING — CT CT ABD-PELV W/O CM
2 of 4 series · 16 of 46 positions shown, 18 images · non-contrast
Comparison: [DATE]

CLINICAL DATA: eval for Perc g-tube placement



[Series 3: a/p w/o 5mm · axial · non-contrast · 0.94mm/px · z∈[+805,+1280]mm · 13 of 105 slices shown, 15 images]
[im 5/105  soft-tissue]
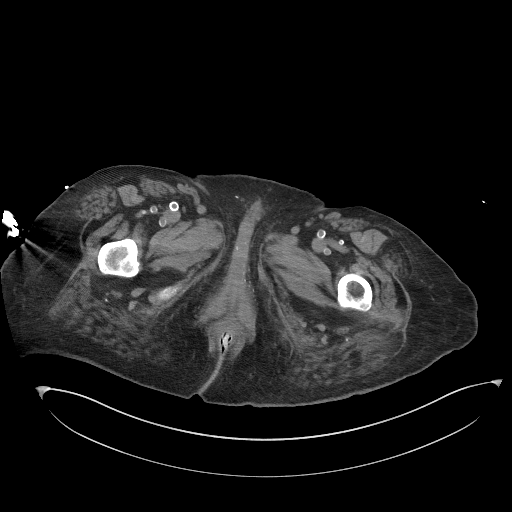
[im 5/105  bone]
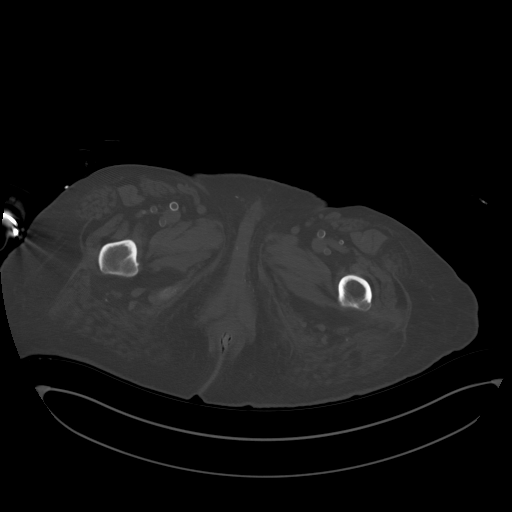
[im 14/105  soft-tissue]
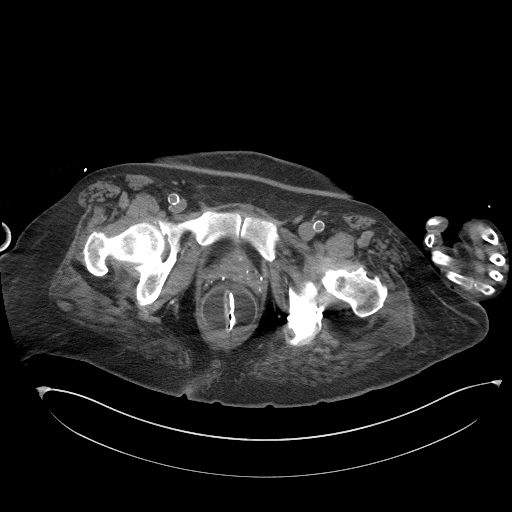
[im 22/105  soft-tissue]
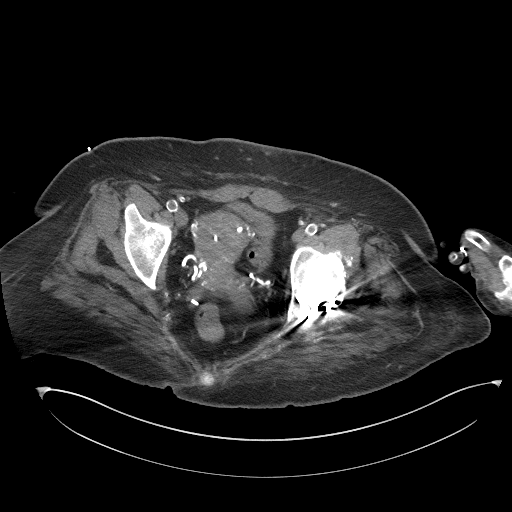
[im 31/105  soft-tissue]
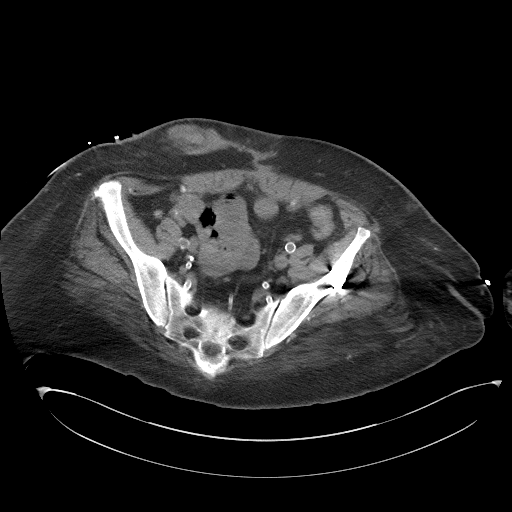
[im 35/105  soft-tissue]
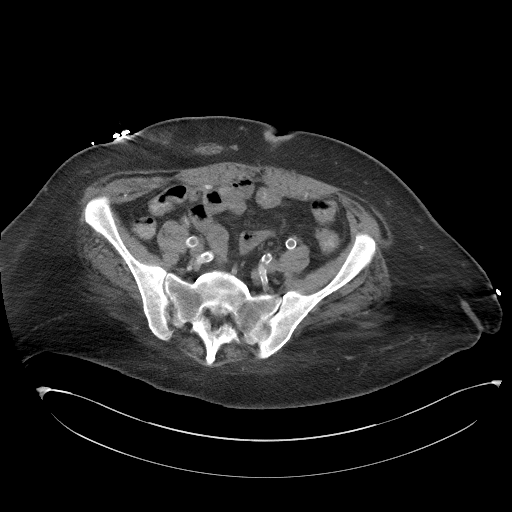
[im 44/105  soft-tissue]
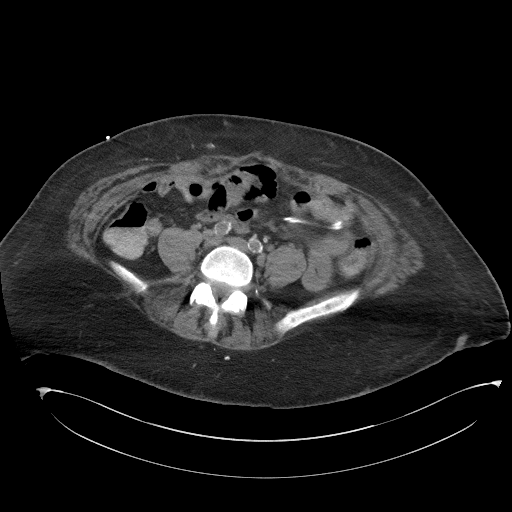
[im 53/105  soft-tissue]
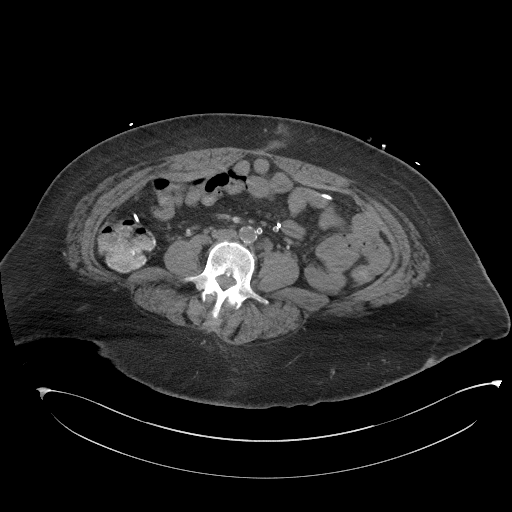
[im 61/105  soft-tissue]
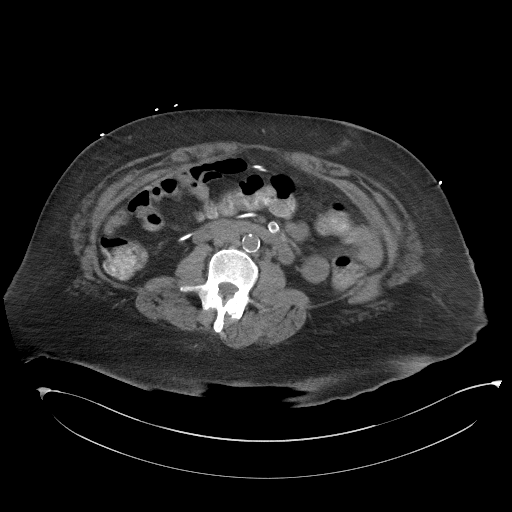
[im 70/105  soft-tissue]
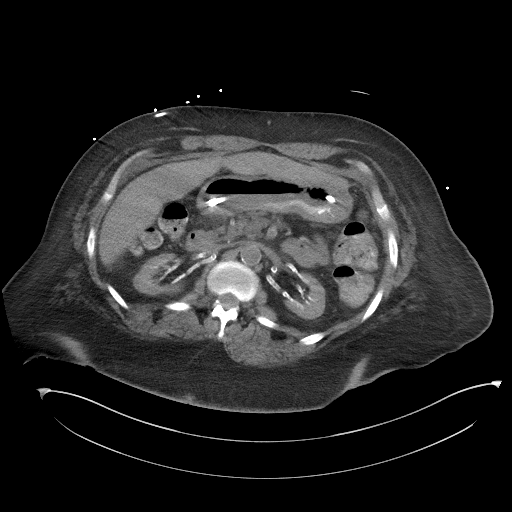
[im 70/105  bone]
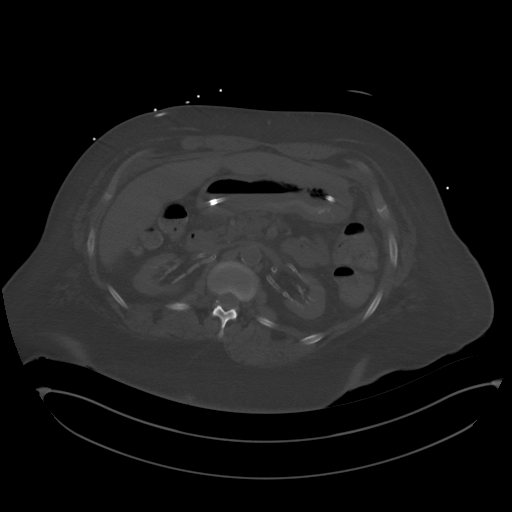
[im 74/105  soft-tissue]
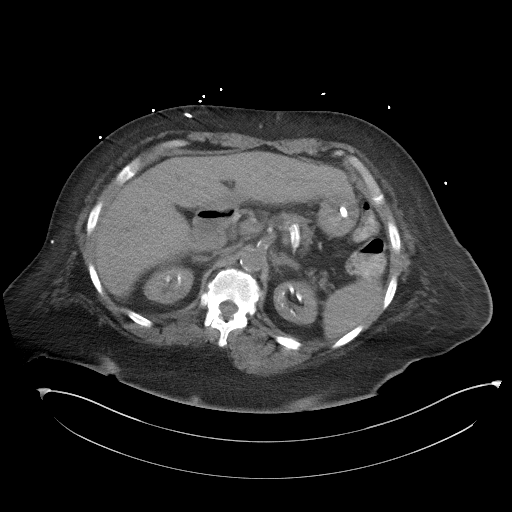
[im 83/105  soft-tissue]
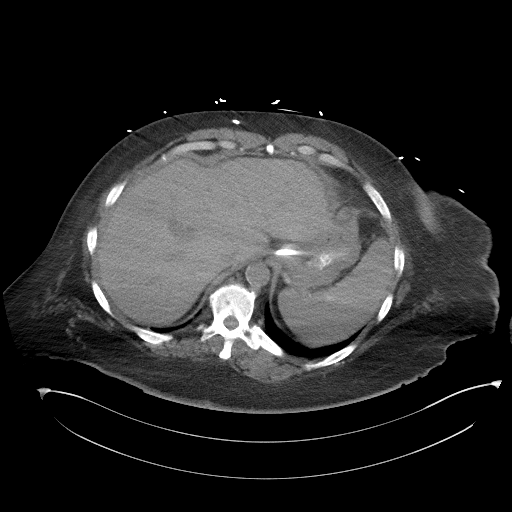
[im 92/105  soft-tissue]
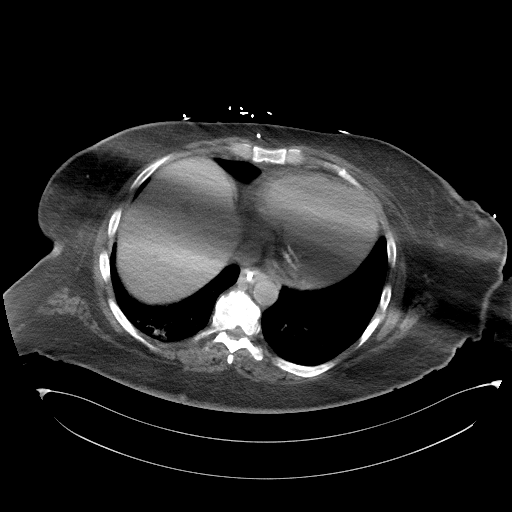
[im 100/105  soft-tissue]
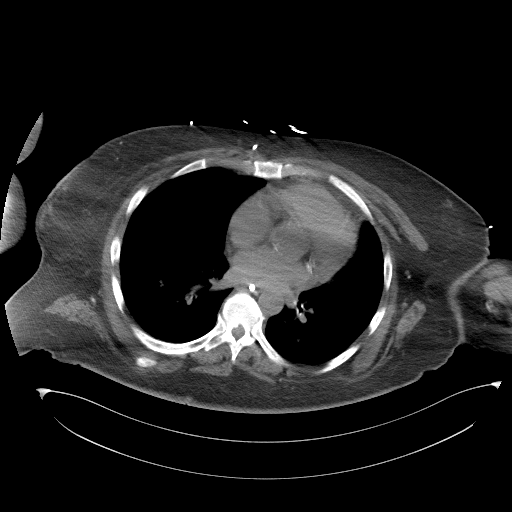

[Series 6: a/p w/o cor · coronal · non-contrast · 1.02mm/px · 3 of 161 slices shown]
[im 54/161  soft-tissue]
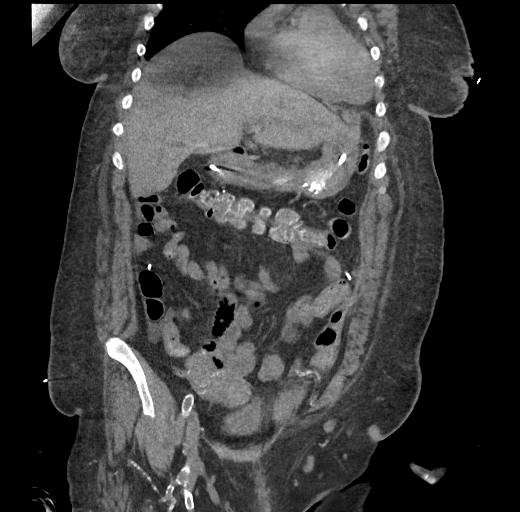
[im 72/161  soft-tissue]
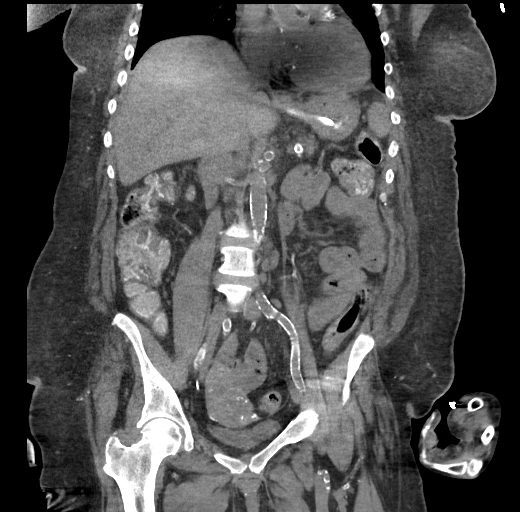
[im 89/161  soft-tissue]
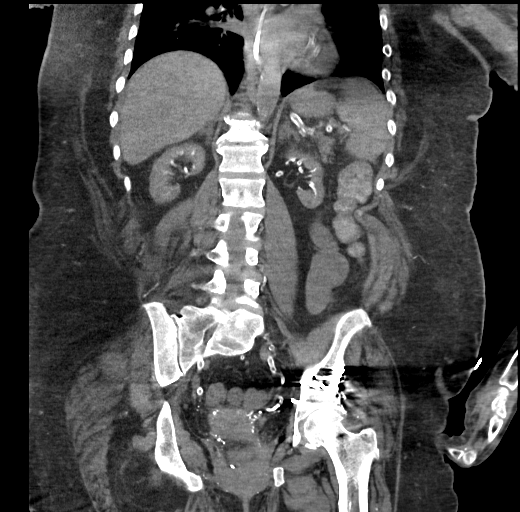

[16 of 46 positions shown; findings below may reference images not displayed]

FINDINGS: Lower chest: There are some interstitial changes seen. No
consolidation, discrete pulmonary nodule or pleural effusion.

Hepatobiliary: No focal liver abnormality is seen. Status post
cholecystectomy. No biliary dilatation.

Pancreas: Unremarkable. No pancreatic ductal dilatation or
surrounding inflammatory changes.

Spleen: Normal in size without focal abnormality.

Adrenals/Urinary Tract: 8 mm lipomatous lesion in the left adrenal,
stable. As before, kidneys are atrophied. 10 mm cyst in the right
kidney, and is more conspicuous in the present study.

Stomach/Bowel: There is an NG tube with its distal end at the distal
gastric antrum/pyloric region. Stomach is located in the upper
abdomen just inferior to the left lobe of the liver. Transverse
colon is inferior to the stomach. There is some stool seen
throughout the colon. Bowel-gas pattern is nonobstructive. Mild
diverticulosis of the sigmoid colon.

Vascular/Lymphatic: Extensive atheromatous calcifications of the
abdominal aorta and its branches including the splenic artery,
bilateral renal arteries, celiac and SMA as well as iliac arteries.

Reproductive: Uterus and bilateral adnexa are unremarkable. There is
an IUD in place. Severe atheromatous calcifications of the uterine
arteries and their branches.

Other: There has been interval removal of the Tenckhoff peritoneal
dialysis catheter with interval resolution of the heterogeneous
large fluid collection in the ventral aspect of the abdomen and
pelvis. There has been interval placement of a VP shunt catheter.
5.5 cm x 2.6 cm soft tissue density at the right anterior pelvic
wall, likely related to the previous Tenckhoff catheter placement.

Musculoskeletal: Again seen are the postsurgical changes at the left
hip joint.
IMPRESSION: Body of the stomach is inferior to the left lobe of the liver and
the transverse colon is inferior to the body of the stomach. The
stomach is amenable for percutaneous G-tube insertion.

Atrophy of the bilateral kidneys.

Extensive atheromatous calcifications of the abdominal aorta and its
branches.

## 2021-09-14 MED ORDER — HYDRALAZINE HCL 50 MG PO TABS
50.0000 mg | ORAL_TABLET | Freq: Three times a day (TID) | ORAL | Status: DC
  Administered 2021-09-14 – 2021-09-15 (×5): 50 mg
  Filled 2021-09-14 (×5): qty 1

## 2021-09-14 MED ORDER — PROSOURCE TF PO LIQD
45.0000 mL | Freq: Two times a day (BID) | ORAL | Status: DC
  Administered 2021-09-14 – 2021-09-17 (×4): 45 mL
  Filled 2021-09-14 (×3): qty 45

## 2021-09-14 MED ORDER — SEVELAMER CARBONATE 800 MG PO TABS: 800.0000 mg | ORAL_TABLET | Freq: Three times a day (TID) | ORAL | Status: DC

## 2021-09-14 MED ORDER — SEVELAMER CARBONATE 0.8 G PO PACK
0.8000 g | PACK | Freq: Three times a day (TID) | ORAL | Status: DC
  Administered 2021-09-14 – 2021-10-05 (×47): 0.8 g
  Filled 2021-09-14 (×65): qty 1

## 2021-09-14 MED ORDER — CHLORHEXIDINE GLUCONATE CLOTH 2 % EX PADS
6.0000 | MEDICATED_PAD | Freq: Every day | CUTANEOUS | Status: DC
  Administered 2021-09-15: 6 via TOPICAL

## 2021-09-14 NOTE — Progress Notes (Signed)
Nutrition Follow-up  DOCUMENTATION CODES:  Obesity unspecified  INTERVENTION:  Continue current TF regimen as ordered for altered electrolytes in the setting of ESRD Continue renavite daily  NUTRITION DIAGNOSIS:  Inadequate oral intake related to inability to eat (pt sedated and ventilated) as evidenced by NPO status. - Remains applicable  GOAL:  Provide needs based on ASPEN/SCCM guidelines - progressing, being met by TF  MONITOR:  TF tolerance  REASON FOR ASSESSMENT:  Ventilator    ASSESSMENT:  56 y/o female with h/o ESRD on HD, HTN, DM, COVID 19 (03/2019) and CHF who is admitted with right thalamus ICH with IVH and obstructive hydrocephalus s/p right frontal ventriculostomy with EVD placement 5/29.  05/29 - s/p EVD 06/02 - s/p trach and cortrak placement  06/14 - s/p VP shunt   Pt resting in bed at the time of assessment, family at bedside. Pt does not interact during exam or discussion with family. SLP worked with pt this AM, not tolerating PMV well and 1 ice chip was attempted but had to be suctioned.  Per neurology, PEG tube not able to be placed due to recent VP shunt placement and anatomy - will require open surgery for G-tube placement.  Talked with RN about pt, no concerns to report. Case management currently working to find placement for pt - likely LTACH.   Current TF Regimen: Nepro with Carbsteady at 58m/h (1.2L/d) Prosource TF BID This regimen provides 2164 kcal, 108g of protein, and 8729mof free water  Nutritionally Relevant Medications: Scheduled Meds:  calcitRIOL  1 mcg Per Tube Q M,W,F   calcium acetate (Phos Binder)  667 mg Per Tube TID WC   PROSource TF  45 mL Per Tube TID   fiber  1 packet Per Tube BID   insulin aspart  0-9 Units Subcutaneous Q4H   insulin glargine-yfgn  15 Units Subcutaneous BID   multivitamin  1 tablet Per Tube QHS   pantoprazole sodium  40 mg Per Tube Daily   Continuous Infusions:  ceFEPime (MAXIPIME) IV Stopped (09/12/21  1730)   feeding supplement (NEPRO CARB STEADY) 1,000 mL (09/14/21 0007)   PRN Meds: diphenhydrAMINE, ondansetron, senna-docusate  Labs Reviewed: BUN 37, creatinine 4.15 Phosphorus 4.7 CBG ranges from 107-159 mg/dL over the last 24 hours HgbA1c 6.2% (5/29)  NUTRITION - FOCUSED PHYSICAL EXAM: Flowsheet Row Most Recent Value  Orbital Region No depletion  Upper Arm Region No depletion  Thoracic and Lumbar Region No depletion  Buccal Region No depletion  Temple Region No depletion  Clavicle Bone Region No depletion  Clavicle and Acromion Bone Region No depletion  Scapular Bone Region Unable to assess  Dorsal Hand No depletion  Patellar Region No depletion  Anterior Thigh Region No depletion  Posterior Calf Region No depletion  Edema (RD Assessment) Moderate  Hair Reviewed  Eyes Reviewed  Mouth Unable to assess  Skin Reviewed  Nails Reviewed    Diet Order:   Diet Order     None       EDUCATION NEEDS:  No education needs have been identified at this time  Skin:  Skin Assessment:  (eschar L great toe amputation site)  Last BM:  2007mer rectal tube  Height:  Ht Readings from Last 1 Encounters:  09/09/21 '5\' 7"'  (1.702 m)    Weight:  Wt Readings from Last 1 Encounters:  09/13/21 97.5 kg    Ideal Body Weight:  61.3 kg  BMI:  Body mass index is 33.67 kg/m.  Estimated Nutritional Needs:  Kcal:  1900-2200kcal/day Protein:  100-120 Fluid:  1L+UOP   Ranell Patrick, RD, LDN Clinical Dietitian RD pager # available in [facility]  After hours/weekend pager # available in [facility]

## 2021-09-14 NOTE — TOC Progression Note (Signed)
Transition of Care [facility]) - Progression Note    Patient Details  Name: Janet Mitchell MRN: [id number] Date of Birth: 1965/05/10  [facility]) CM/SW Contact  Pollie Friar, RN Phone Number: 09/14/2021, 11:18 AM  Clinical Narrative:    [facility] to talk with patients spouse today about admission. They hope to start insurance authorization after the conversation. [facility] wont have bed this week for admission.  TOC following.   Expected Discharge Plan: Long Term Acute Care (LTAC) Barriers to Discharge: Ship broker, Continued Medical Work up  Expected Discharge Plan and Services Expected Discharge Plan: [place] (LTAC) In-house Referral: Clinical Social Work Discharge Planning Services: CM Consult   Living arrangements for the past 2 months: Single Family Home                                       Social Determinants of Health (SDOH) Interventions    Readmission Risk Interventions    07/19/2019    1:50 PM 07/12/2019    4:57 PM 04/05/2019    2:18 PM  Readmission Risk Prevention Plan  Transportation Screening Complete Complete Complete  Medication Review Press photographer) Complete  Complete  PCP or Specialist appointment within 3-5 days of discharge Patient refused    [place] or [place] Complete    Palliative Care Screening Not Applicable  Not [place] Not Applicable Not Applicable Patient Refused

## 2021-09-14 NOTE — Progress Notes (Signed)
Subjective: NAEs  Objective: Vital signs in last 24 hours: Temp:  [98.1 F (36.7 C)-99.8 F (37.7 C)] 98.6 F (37 C) (06/20 1153) Pulse Rate:  [76-87] 82 (06/20 1222) Resp:  [16-23] 18 (06/20 1222) BP: (104-170)/(69-92) 142/81 (06/20 1153) SpO2:  [90 %-99 %] 94 % (06/20 1242) FiO2 (%):  [21 %-28 %] 28 % (06/20 1242) Weight:  [97.5 kg-99.7 kg] 97.5 kg (06/19 1930)  Intake/Output from previous day: 06/19 0701 - 06/20 0700 In: 450 [NG/GT:450] Out: -  Intake/Output this shift: No intake/output data recorded.  Trach'd FC on right side Incisions c/d  Lab Results: Recent Labs    09/12/21 0223 09/13/21 0653  WBC 10.1 10.0  HGB 9.3* 9.2*  HCT 29.3* 28.4*  PLT 185 177   BMET Recent Labs    09/13/21 0653 09/14/21 0043  NA 134* 135  K 5.4* 4.3  CL 91* 92*  CO2 27 29  GLUCOSE 148* 150*  BUN 86* 37*  CREATININE 7.22* 4.15*  CALCIUM 9.7 9.6    Studies/Results: No results found.  Assessment/Plan: S/p VPS -Scalp staple removal on 6/24   Vallarie Mare 09/14/2021, 1:15 PM

## 2021-09-14 NOTE — Progress Notes (Signed)
[facility] Progress Note  Subjective:  Patient was moved from [facility] to [facility] in the interim.  Last HD on 6/19 with 2.5 kg UF.  She's been following commands earlier per RN - just worked with therapy and really tired now.  She's on 5 liters 21% oxygen via wall to trach      Review of systems:   Unable to obtain secondary to reduced responsiveness and trach  Vitals:   09/14/21 0500 09/14/21 0617 09/14/21 0700 09/14/21 0832  BP: (!) 159/92 (!) 166/78 132/69   Pulse: 87  86 85  Resp: '20  20 17  '$ Temp: 99.3 F (37.4 C)  99.8 F (37.7 C)   TempSrc: Oral  Oral   SpO2: 92%  93% 94%  Weight:      Height:        Physical Exam:    Adult female in bed in NAD Trach in place on 5 liters oxygen via call to trach no jvd Chest cta bilat S1S2 no rub Abd soft ntnd  Ext no LE edema appreciated Neuro - no continuous sedation - minimally interactive with me but she just finished working with therapy per RN LUE AVF +bruit   Home meds include - asa, tylenol, renavite, cadexomer iodine, phoslo, ceterizine, cholestyramine, hydralazine 10 bid, labetalol 200 bid, losartan 100, psyllium     OP HD: [facility] MWF  4h  97.5kg  450/500  2/2.5 bath  15ga  AVF  Hep none - mircera 100 ug q2 - rocaltrol 1.0 mcg po tiw   Assessment/ Plan Right thalamic intracranial hemorrhage - w/ intraventricular hemorrhage and hydrocephalus. SP ventriculostomy drain placement, then VP shunt surgery was done on 6/14. Per neurosurgery.  ESRD - HD per MWF schedule.  Note hyperkalemia - may be secondary to reduced clearance. S/p Lokelma on 6/19  HTN -  losartan and norvasc were resumed earlier for HTN.  Optimize volume with HD. May need to drop the losartan Anemia CKD - got aranesp on 6/17 at 100 mcg off schedule.  Is ordered for 100 mcg weekly on Friday   CKD-MBD - On calcitriol.  Hyperphos - resumed phoslo then transitioned to renvela as hypercalcemia per corrected calcium.  On nepro.  Acute hypoxic resp  failure/ s/p trach.  Per primary team  Atrial fib - a/c on hold, off BB, in NSR  Disposition - she has a trach in place.  Spoke with HD SW.  She states team is looking for an LTAC - I would agree with same.  She is not able to be placed at an outpatient HD unit   Recent Labs  Lab 09/08/21 0255 09/09/21 0303  HGB 9.7* 9.1*  ALBUMIN 2.9* 2.6*  CALCIUM 10.3 9.3  PHOS 4.9* 5.7*  CREATININE 6.35* 4.92*  K 6.6* 4.8    Inpatient medications:  amantadine  200 mg Per Tube Weekly   calcitRIOL  1 mcg Per Tube Q M,W,F   chlorhexidine  15 mL Mouth Rinse BID   Chlorhexidine Gluconate Cloth  6 each Topical Q0600   Chlorhexidine Gluconate Cloth  6 each Topical Q0600   darbepoetin (ARANESP) injection - DIALYSIS  100 mcg Intravenous Q Wed-HD   feeding supplement (PROSource TF)  45 mL Per Tube TID   fiber  1 packet Per Tube BID   heparin injection (subcutaneous)  5,000 Units Subcutaneous Q8H   insulin aspart  0-9 Units Subcutaneous Q4H   insulin aspart  6 Units Subcutaneous Q4H   insulin glargine-yfgn  15  Units Subcutaneous Daily   mouth rinse  15 mL Mouth Rinse q12n4p   multivitamin  1 tablet Per Tube QHS   pantoprazole sodium  40 mg Per Tube Daily    sodium chloride 0 mL/hr at 09/06/21 1141   ceFEPime (MAXIPIME) IV     feeding supplement (OSMOLITE 1.5 CAL) 50 mL/hr at 09/09/21 0800   acetaminophen **OR** acetaminophen (TYLENOL) oral liquid 160 mg/5 mL **OR** acetaminophen, diphenhydrAMINE, fentaNYL (SUBLIMAZE) injection, Gerhardt's butt cream, ipratropium-albuterol, labetalol, ondansetron (ZOFRAN) IV, polyvinyl alcohol, senna-docusate     Recent Labs  Lab 09/12/21 0223 09/13/21 0653 09/14/21 0043  HGB 9.3* 9.2*  --   ALBUMIN 2.7* 2.7* 2.7*  CALCIUM 9.3 9.7 9.6  PHOS 6.2* 7.4* 4.7*  CREATININE 5.26* 7.22* 4.15*  K 5.4* 5.4* 4.3   Inpatient medications:  amantadine  200 mg Per Tube Weekly   amLODipine  10 mg Per Tube Daily   calcitRIOL  1 mcg Per Tube Q M,W,F   calcium acetate  (Phos Binder)  667 mg Per Tube TID WC   carvedilol  6.25 mg Per NG tube BID WC   chlorhexidine  15 mL Mouth Rinse BID   Chlorhexidine Gluconate Cloth  6 each Topical Q0600   darbepoetin (ARANESP) injection - DIALYSIS  100 mcg Intravenous Q Fri-HD   feeding supplement (PROSource TF)  45 mL Per Tube BID   fiber  1 packet Per Tube BID   heparin injection (subcutaneous)  5,000 Units Subcutaneous Q8H   hydrALAZINE  50 mg Per Tube Q8H   insulin aspart  0-9 Units Subcutaneous Q4H   insulin glargine-yfgn  15 Units Subcutaneous BID   mouth rinse  15 mL Mouth Rinse q12n4p   multivitamin  1 tablet Per Tube QHS   pantoprazole sodium  40 mg Per Tube Daily    sodium chloride 0 mL/hr at 09/06/21 1141   ceFEPime (MAXIPIME) IV Stopped (09/12/21 1730)   feeding supplement (NEPRO CARB STEADY) 1,000 mL (09/14/21 0007)   acetaminophen **OR** acetaminophen (TYLENOL) oral liquid 160 mg/5 mL **OR** acetaminophen, diphenhydrAMINE, fentaNYL (SUBLIMAZE) injection, Gerhardt's butt cream, ipratropium-albuterol, labetalol, ondansetron (ZOFRAN) IV, polyvinyl alcohol, senna-docusate   Claudia Desanctis, MD 09/14/2021 10:22 AM

## 2021-09-14 NOTE — Progress Notes (Signed)
PROGRESS NOTE                                                                                                                                                                                                             Patient Demographics:    Janet Mitchell, is a 56 y.o. female, DOB - Nov 05, 1965, BHA:[id number]  Outpatient Primary MD for the patient is Lindell Spar, MD    LOS - 38  Admit date - 08/23/2021    Chief Complaint  Patient presents with   Code Stroke       Brief Narrative (HPI from H&P)  56 y.o. female with history of ESRD on HD MWF, CHF, poorly controlled HTN, DM presenting with nausea, vomiting, left side weakness present upon waking.  Scented to [facility] on 08/23/2021 with nausea vomiting left-sided weakness, work-up showed right-sided thalamic hemorrhage she was intubated and transferred to [facility] under ICU care for further treatment.  She was seen by [facility], nephrology for HD, neurology and neurosurgery, external ventricular drain placement by neurosurgery on 08/23/2021.  She also was intubated, trached, during her hospital stay she had tracheostomy site infection for which she is on antibiotic.  She has been transferred to hospitalist service on 09/13/2021 on day 20 one of her hospital stay with plan of going to [place].   Events procedures  5/28 > Presents to [facility] ED, intubated. 5/29 > Transferred to [facility] Echocardiogram with EF 60%, no acute changes. 5/30 > HD, more awake, following commands with RUE/RLE 6/1 No issues overnight, she remains alert and interactive on vent.  Extubated 6/2 failed extubation requiring reintubation. Trached. 6/4 Placed on ATC yesterday afternoon and has tolerated well since 6/5: EVD raised to 20 cm H20 6/7: Failed EVD clamping trial after developing emesis and headache 6/8: Failed EVD clamping due to emesis 6/9: Increase EVD pressure to 25 6/12 back on vent  due to hypoxia from mucus plugging, started back on antibiotics 6/14 VP shunt placed. Antibiotics adjusted based on cultures 6/17-has been off the vent since 09/10/2021, did not require to be on overnight. 6/19.  Transferred to hospitalist service    Subjective:    Janet Mitchell today in bed, denies any headache chest pain or shortness of breath.  Answers with nods of her head.   Assessment  & Plan :  Right thalamic intracranial hemorrhage extending into right midbrain and intraventricular hemorrhage with mild obstructive hydrocephalus requiring external ventricular drain placement by neurosurgery on 08/23/2021 and subsequent VP shunt placement on 09/08/2021.  Hemorrhage was due to uncontrolled hypertension.  Brain bleed contributing to severe left-sided weakness, dysphagia along with toxic encephalopathy.  She has been seen by neurosurgery, neurology, she is currently trached with ongoing left-sided weakness, NG tube feeds, case discussed with neurologist Dr. Leonie Man on 09/13/2021.  Currently goal is supportive care, keep blood pressure systolic under 400, arrange for LTAC placement with most likely continued NG tube, PEG tube would likely cannot be placed for several weeks due to recent VP shunt placement and variations in her GI anatomy.  No antithrombotics.   2.  Inability to maintain airway with acute nonspecific respiratory failure due to #1 above requiring intubation, tracheostomy.  Now with trach collar with tracheostomy site infection.  Currently on antibiotics for treatment of Enterobacter and Staph aureus, antibiotic stop date 09/15/2021 per PCCM team.  Tracheostomy deferred to PCCM.  3.  Dysphagia and left-sided hemiparesis due to #1 above.  Supportive care, LTAC/CIR, currently NG tube feeds, PEG tube to be placed by surgical team after several weeks, due to variation in anatomy and VP shunt placement.  Most likely will require NG tube feeds for the next several weeks.  4.  ESRD.   Nephrology following.  On MWF schedule.  5.  Poorly controlled blood pressure.  Case discussed with neurology on 09/13/2021, goal SBP under 160, medications adjusted on 09/13/2021, ARB discontinued due to ESRD and hyperkalemia.  6.  Anemia of chronic disease due to ESRD.  Supportive care.  7.  DM type II.  Lantus + Sliding scale.  Monitor.  Lab Results  Component Value Date   HGBA1C 6.2 (H) 08/23/2021   CBG (last 3)  Recent Labs    09/14/21 0001 09/14/21 0506 09/14/21 0800  GLUCAP 149* 143* 159*         Condition - Extremely Guarded  Family Communication  : Husband Joneen Boers [phone] on 09/13/2021 at 9:13 AM  Code Status : Full  Consults  : Neurology, neurosurgery, renal  PUD Prophylaxis : PPI       Disposition Plan  :    Status is: Inpatient  DVT Prophylaxis  :    heparin injection 5,000 Units Start: 09/09/21 0600 SCD's Start: 08/23/21 0925   Lab Results  Component Value Date   PLT 177 09/13/2021    Diet :  Diet Order     None        Inpatient Medications  Scheduled Meds:  amantadine  200 mg Per Tube Weekly   amLODipine  10 mg Per Tube Daily   calcitRIOL  1 mcg Per Tube Q M,W,F   calcium acetate (Phos Binder)  667 mg Per Tube TID WC   carvedilol  6.25 mg Per NG tube BID WC   chlorhexidine  15 mL Mouth Rinse BID   Chlorhexidine Gluconate Cloth  6 each Topical Q0600   darbepoetin (ARANESP) injection - DIALYSIS  100 mcg Intravenous Q Fri-HD   feeding supplement (PROSource TF)  45 mL Per Tube TID   fiber  1 packet Per Tube BID   heparin injection (subcutaneous)  5,000 Units Subcutaneous Q8H   hydrALAZINE  50 mg Per Tube Q8H   insulin aspart  0-9 Units Subcutaneous Q4H   insulin glargine-yfgn  15 Units Subcutaneous BID   mouth rinse  15 mL Mouth Rinse q12n4p   multivitamin  1 tablet Per Tube QHS   pantoprazole sodium  40 mg Per Tube Daily   Continuous Infusions:  sodium chloride 0 mL/hr at 09/06/21 1141   ceFEPime (MAXIPIME) IV Stopped  (09/12/21 1730)   feeding supplement (NEPRO CARB STEADY) 1,000 mL (09/14/21 0007)   PRN Meds:.acetaminophen **OR** acetaminophen (TYLENOL) oral liquid 160 mg/5 mL **OR** acetaminophen, diphenhydrAMINE, fentaNYL (SUBLIMAZE) injection, Gerhardt's butt cream, ipratropium-albuterol, labetalol, ondansetron (ZOFRAN) IV, polyvinyl alcohol, senna-docusate  Time Spent in minutes  30   Lala Lund M.D on 09/14/2021 at 9:01 AM  To page go to [email or web address]   [facility] -  Office  [phone]  See all Orders from today for further details    Objective:   Vitals:   09/14/21 0500 09/14/21 0617 09/14/21 0700 09/14/21 0832  BP: (!) 159/92 (!) 166/78 132/69   Pulse: 87  86 85  Resp: '20  20 17  '$ Temp: 99.3 F (37.4 C)  99.8 F (37.7 C)   TempSrc: Oral  Oral   SpO2: 92%  93% 94%  Weight:      Height:        Wt Readings from Last 3 Encounters:  09/13/21 97.5 kg  06/03/21 99.1 kg  08/04/20 95.2 kg     Intake/Output Summary (Last 24 hours) at 09/14/2021 0901 Last data filed at 09/13/2021 1600 Gross per 24 hour  Intake 400 ml  Output --  Net 400 ml     Physical Exam  Awake, nods head to basic questions, moves right-sided extremities, dense left-sided hemiparesis with some early contractures developing, NG tube in place, trach collar in place Freedom. Supple Neck, No JVD,   Symmetrical Chest wall movement, Good air movement bilaterally, CTAB RRR,No Gallops, Rubs or new Murmurs,  +ve B.Sounds, Abd Soft, No tenderness,   No Cyanosis, Clubbing or edema       Data Review:    CBC Recent Labs  Lab 09/09/21 0303 09/10/21 0547 09/11/21 0629 09/12/21 0223 09/13/21 0653  WBC 10.0 8.6 9.0 10.1 10.0  HGB 9.1* 8.8* 10.6* 9.3* 9.2*  HCT 27.8* 27.0* 33.4* 29.3* 28.4*  PLT 200 146* 162 185 177  MCV 77.4* 76.5* 78.6* 77.3* 77.0*  MCH 25.3* 24.9* 24.9* 24.5* 24.9*  MCHC 32.7 32.6 31.7 31.7 32.4  RDW 16.4* 16.2* 16.7* 16.5* 16.7*  LYMPHSABS 1.1 1.8  --   --   --   MONOABS 0.6  0.8  --   --   --   EOSABS 0.1 0.4  --   --   --   BASOSABS 0.1 0.1  --   --   --     Electrolytes Recent Labs  Lab 09/10/21 0547 09/11/21 0629 09/12/21 0223 09/13/21 0653 09/14/21 0043  NA 135 133* 134* 134* 135  K 5.7* 5.4* 5.4* 5.4* 4.3  CL 92* 92* 93* 91* 92*  CO2 '23 22 29 27 29  '$ GLUCOSE 215* 134* 133* 148* 150*  BUN 76* 32* 56* 86* 37*  CREATININE 6.93* 3.68* 5.26* 7.22* 4.15*  CALCIUM 9.8 9.1 9.3 9.7 9.6  ALBUMIN 2.9* 3.0* 2.7* 2.7* 2.7*    ------------------------------------------------------------------------------------------------------------------ No results for input(s): "CHOL", "HDL", "LDLCALC", "TRIG", "CHOLHDL", "LDLDIRECT" in the last 72 hours.  Lab Results  Component Value Date   HGBA1C 6.2 (H) 08/23/2021     Radiology Reports No results found.

## 2021-09-14 NOTE — Progress Notes (Signed)
STROKE TEAM PROGRESS NOTE   INTERVAL HISTORY Her husband and daughter are at the bedside.  Patient is sleepy but can be aroused. Still on trach collar, lots of secretions.  Follows commands. Left arm with increased tone. Hemiplegic on left side, Withdraws to noxious stimuli on left side. Right arm and leg 4/5. Vitals are stable. New neurological events. No family at bedside.   vital signs stable.  Neurological exam unchanged  .    Vitals:   09/14/21 0832 09/14/21 1153 09/14/21 1222 09/14/21 1242  BP:  (!) 142/81    Pulse: 85 81 82   Resp: '17 19 18   '$ Temp:  98.6 F (37 C)    TempSrc:  Axillary    SpO2: 94% 91% 90% 94%  Weight:      Height:       CBC:  Recent Labs  Lab 09/09/21 0303 09/10/21 0547 09/11/21 0629 09/12/21 0223 09/13/21 0653  WBC 10.0 8.6   < > 10.1 10.0  NEUTROABS 7.8* 5.3  --   --   --   HGB 9.1* 8.8*   < > 9.3* 9.2*  HCT 27.8* 27.0*   < > 29.3* 28.4*  MCV 77.4* 76.5*   < > 77.3* 77.0*  PLT 200 146*   < > 185 177   < > = values in this interval not displayed.   Basic Metabolic Panel:  Recent Labs  Lab 09/13/21 0653 09/14/21 0043  NA 134* 135  K 5.4* 4.3  CL 91* 92*  CO2 27 29  GLUCOSE 148* 150*  BUN 86* 37*  CREATININE 7.22* 4.15*  CALCIUM 9.7 9.6  PHOS 7.4* 4.7*     IMAGING past 24 hours No results found.  PHYSICAL EXAM  Physical Exam  Constitutional: Appears well-developed and well-nourished middle-age African-American lady.  Cardiovascular: Normal rate and regular rhythm.  Respiratory: Effort normal, non-labored breathing, on trach via vent  Neuro - awake, alert, eyes open, nonverbal on vent via trach, but following most simple commands.  Left gaze preference, however able to cross midline with incomplete right gaze, blinking to visual threat bilaterally, PERRL.  Left facial droop. Tongue midline.  Left hemiplegia with increased tone, RUE and RLE 3/5. Sensation withdraws to noxious stimuli, right FTN no ataxia, gait not tested.     ASSESSMENT/PLAN Ms. Abigail Teall Pinnix is a 56 y.o. female with history of ESRD on HD MWF, CHF, HTN, DM presenting with nausea, vomiting, left side weakness present upon waking. Intubated emergently at [facility] and transferred to [facility] on 5/59.  NSGY consulted, EVD placement 5/29. MRI shows stable size in right thalamic hemorrhage with edema extending to the pons and midbrain. EVD still in place. Extubated 08/26/2021. Reintubated 08/27/2021 and then tracheostomy placed. Consult for PEG tube sent to trauma team.   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 Small volume intraventricular extension of hemorrhage. Mild hydrocephalus. 5/29 Head CT 2200- decrease in size of lateral and third ventricles s/p EVD placement  Repeat CT head 6/12 -1. No significant interval change in the right thalamic hemorrhage with associated mass effect and mild compression of the third  ventricle. 2. Slight interval increase in the size of the  lateral ventricles compared to CT of 09/03/2021 which may represent mild hydrocephalus. CTA head & neck No arterial lesion underlying the right thalamic hematoma. Equivocal for spot sign which would be an adverse finding suggesting continued growth MRI  no change in size of the right thalamic intraparenchymal hemorrhage extending to midbrain, maximal dimension 3.6 cm by MRI. Surrounding edema, likely slightly more prominent ventricles are however similar in size to the CT of 08/23/2021. 2D Echo EF 60-65%, consistent with grade II diastolic dysfunction EEG - mild diffuse encephalopathy LDL 38 HgbA1c 6.2 VTE prophylaxis - SCDs aspirin 81 mg daily prior to admission, now on No  antithrombotic.  Therapy recommendations:  CIR Disposition:  Pending  Obstructive Hydrocephalus NSGY on board 5/29- EVD placement  Follow up CT shows slightly decrease in size of lateral and third ventricle  Unable to tolerate clamping trial with nausea and vomiting.  S/p VP shunt 6/14  Cerebral edema Hyponatremia, improved  On 3% saline @ 75- d/c'd Given ESRD, off 3% saline Na 131-130-135-133-134  Acute Hypoxic Respiratory failure Intubated 5/29 CCM managing vent Extubated 6/1 Reintubated and s/p trach 6/2 Required being placed on ventilator 6/12. Unable to wean from vent a this time, attempt as tolerated and appropriate  On Zosyn for HCAP Has been on trach collar since 6/16 and tolerating well   Hypertensive emergency-resolved Home meds:  labetolol, hydralazine, losartan Stable BP less than 160 On losartan 100, labetalol 200 3 times daily, hydralazine 50 3 times daily,  amlodipine 10 Long-term BP goal normotensive cleviprex off  End Stage Renal Failure on hemodialysis MWF Nephrology consulted Monitor BMP/electrolytes  Diabetes Mellitus A1c 6.2, controlled CBGs SSI Close PCP follow-up  Dysphagia  cortrak placed on TF speech on board Per surgery, pt not candidate for PEG given recent VPS and anatomy. If need feeding tube, pt will need open abdomen surgery  Other Stroke Risk Factors Obesity, Body mass index is 33.67 kg/m., BMI >/= 30 associated with increased stroke risk, recommend weight loss, diet and exercise as appropriate  Congestive heart failure  Other Active Problems Hyperkalemia  K 6.1->4.2-> 3.5->6.6, emergent HD->5.7  Hospital day # 22 Continue ongoing therapies.  Mobilize out of bed.  Consider transfer to [facility] as patient cannot have a PEG tube for 4 to 6 weeks as per trauma team due to abdominal adhesions.  Continue nourishment and medications via Panda tube.  Discussed with Dr. Candiss Norse and answered questions.  Discussed with patient and husband and  daughter and answered questions.  Greater than 50% time during this 35-minute visit was spent in counseling and coordination of care and discussion with patient and family and care team and answering questions    Antony Contras, MD To contact Stroke Continuity provider, please refer to [email or web address] After hours, contact General Neurology

## 2021-09-14 NOTE — Progress Notes (Signed)
Physical Therapy Treatment Patient Details Name: Janet Mitchell MRN: [id number] DOB: 1966-01-13 Today's Date: 09/14/2021   History of Present Illness 56 y.o. female presents to [facility] hospital as a transfer from [place] on 08/23/2021 with complaints of nausea, vomiting, L weakness. Pt required emergent intubation during transfer from [facility]. CTH reveals R thalamic hemorrhage with IVH. Pt underwent R frontal ventriculostomy on 5/29. Extubated 6/1, reintubated 6/2, tracheostomy 6/2. Pt failed EVD clamping and VP shunt placed 6/14. PMH includes HTN, CHF, MDII, ESRD.    PT Comments    Pt remains lethargic with minimal eye opening t/o session. Pt did follow simple commands majority of time despite eyes being closed. Pt continues to be totalA for all mobility. Pt with some purposeful movement of R UE to scratch her face other than that no active effort to contribute to transfer to EOB. PT did provide passive stretching to trunk via rotation while EOB and posterior truncal stretch from anterior shoulders. Acute PT to cont to follow.    Recommendations for follow up therapy are one component of a multi-disciplinary discharge planning process, led by the attending physician.  Recommendations may be updated based on patient status, additional functional criteria and insurance authorization.  Follow Up Recommendations  Long-term institutional care without follow-up therapy     Assistance Recommended at Discharge Frequent or constant Supervision/Assistance  Patient can return home with the following Two people to help with walking and/or transfers;Two people to help with bathing/dressing/bathroom;Assistance with cooking/housework;Assistance with feeding;Direct supervision/assist for medications management;Direct supervision/assist for financial management;Assist for transportation;Help with stairs or ramp for entrance   Equipment Recommendations  Hospital bed;Other (comment);Wheelchair (measurements  PT);Wheelchair cushion (measurements PT) (hoyer lift)    Recommendations for Other Services Rehab consult     Precautions / Restrictions Precautions Precautions: Fall;Other (comment) Precaution Comments: trach, new VP shunt, cortrack, flexiseal Restrictions Weight Bearing Restrictions: No     Mobility  Bed Mobility Overal bed mobility: Needs Assistance Bed Mobility: Supine to Sit, Sit to Supine     Supine to sit: Total assist, +2 for physical assistance, HOB elevated Sit to supine: Total assist, +2 for physical assistance, HOB elevated   General bed mobility comments: HOB elevated, pt requires totalA with minimal strength noted in core    Transfers                        Ambulation/Gait                   Stairs             Wheelchair Mobility    Modified Rankin (Stroke Patients Only) Modified Rankin (Stroke Patients Only) Pre-Morbid Rankin Score: No symptoms Modified Rankin: Severe disability     Balance Overall balance assessment: Needs assistance Sitting-balance support: No upper extremity supported, Feet unsupported Sitting balance-Leahy Scale: Zero Sitting balance - Comments: totalA, no righting reaction when posterior support decreased Postural control: Posterior lean                                  Cognition Arousal/Alertness: Lethargic Behavior During Therapy: Flat affect Overall Cognitive Status: Difficult to assess                                 General Comments: pt will open her eyes to name majority of time but  unable to maintain eyes open, follows simple commands most of the time with R UE and LE but unable to maintain alertness > 30 sec, pt quickly returns to sleeping t/o session        Exercises General Exercises - Lower Extremity Long Arc Quad: AAROM, Right, 10 reps, Seated (L LE passive ROM x 10 reps in sitting)    General Comments General comments (skin integrity, edema, etc.): VSS,  pt on 5LO2, 28% via trach collar      Pertinent Vitals/Pain Pain Assessment Faces Pain Scale: No hurt    Home Living                          Prior Function            PT Goals (current goals can now be found in the care plan section) Acute Rehab PT Goals Patient Stated Goal: not stated PT Goal Formulation: With patient Time For Goal Achievement: 09/24/21 Potential to Achieve Goals: Poor    Frequency    Min 2X/week      PT Plan Current plan remains appropriate    Co-evaluation              AM-PAC PT "6 Clicks" Mobility   Outcome Measure  Help needed turning from your back to your side while in a flat bed without using bedrails?: Total Help needed moving from lying on your back to sitting on the side of a flat bed without using bedrails?: Total Help needed moving to and from a bed to a chair (including a wheelchair)?: Total Help needed standing up from a chair using your arms (e.g., wheelchair or bedside chair)?: Total Help needed to walk in hospital room?: Total Help needed climbing 3-5 steps with a railing? : Total 6 Click Score: 6    End of Session Equipment Utilized During Treatment: Oxygen Activity Tolerance: Patient tolerated treatment well Patient left: in bed;with call bell/phone within reach;with bed alarm set Nurse Communication: Mobility status;Need for lift equipment PT Visit Diagnosis: Other abnormalities of gait and mobility (R26.89);Other symptoms and signs involving the nervous system (R29.898);Muscle weakness (generalized) (M62.81);Hemiplegia and hemiparesis Hemiplegia - Right/Left: Left Hemiplegia - dominant/non-dominant: Non-dominant Hemiplegia - caused by: Nontraumatic intracerebral hemorrhage     Time: 1093-2355 PT Time Calculation (min) (ACUTE ONLY): 17 min  Charges:  $Therapeutic Activity: 8-22 mins                     Kittie Plater, PT, DPT Acute Rehabilitation Services Secure chat preferred Office #:  [phone]    Berline Lopes 09/14/2021, 11:50 AM

## 2021-09-14 NOTE — Progress Notes (Signed)
RN called RT stating pt's SpO2 read 88%. RT came to bedside to find pt's SpO2 reading 88% with good waveform and no distress. RT suctioned pt with small tan/pink tinged secretions. RT also placed new inner cannula, with no improvement to saturation. RT placed pt on oxygen 5L 28% trach collar. Pt tolerating well at this time with improvement to SpO2 97%. RN of pt notified. RT will continue to monitor pt.

## 2021-09-14 NOTE — Plan of Care (Signed)
  Problem: Education: Goal: Knowledge of disease or condition will improve Outcome: Progressing Goal: Knowledge of secondary prevention will improve (SELECT ALL) Outcome: Progressing Goal: Knowledge of patient specific risk factors will improve (INDIVIDUALIZE FOR PATIENT) Outcome: Progressing Goal: Individualized Educational Video(s) Outcome: Progressing   Problem: Coping: Goal: Will verbalize positive feelings about self Outcome: Progressing Goal: Will identify appropriate support needs Outcome: Progressing   Problem: Health Behavior/Discharge Planning: Goal: Ability to manage health-related needs will improve Outcome: Progressing   Problem: Self-Care: Goal: Ability to participate in self-care as condition permits will improve Outcome: Progressing Goal: Verbalization of feelings and concerns over difficulty with self-care will improve Outcome: Progressing Goal: Ability to communicate needs accurately will improve Outcome: Progressing   Problem: Nutrition: Goal: Risk of aspiration will decrease Outcome: Progressing Goal: Dietary intake will improve Outcome: Progressing   Problem: Intracerebral Hemorrhage Tissue Perfusion: Goal: Complications of Intracerebral Hemorrhage will be minimized Outcome: Progressing   Problem: Education: Goal: Knowledge of General Education information will improve Description: Including pain rating scale, medication(s)/side effects and non-pharmacologic comfort measures Outcome: Progressing   Problem: Health Behavior/Discharge Planning: Goal: Ability to manage health-related needs will improve Outcome: Progressing   Problem: Clinical Measurements: Goal: Ability to maintain clinical measurements within normal limits will improve Outcome: Progressing Goal: Will remain free from infection Outcome: Progressing Goal: Diagnostic test results will improve Outcome: Progressing Goal: Respiratory complications will improve Outcome:  Progressing Goal: Cardiovascular complication will be avoided Outcome: Progressing   Problem: Activity: Goal: Risk for activity intolerance will decrease Outcome: Progressing   Problem: Nutrition: Goal: Adequate nutrition will be maintained Outcome: Progressing   Problem: Coping: Goal: Level of anxiety will decrease Outcome: Progressing   Problem: Elimination: Goal: Will not experience complications related to bowel motility Outcome: Progressing Goal: Will not experience complications related to urinary retention Outcome: Progressing   Problem: Pain Managment: Goal: General experience of comfort will improve Outcome: Progressing   Problem: Safety: Goal: Ability to remain free from injury will improve Outcome: Progressing   Problem: Skin Integrity: Goal: Risk for impaired skin integrity will decrease Outcome: Progressing   Problem: Education: Goal: Ability to describe self-care measures that may prevent or decrease complications (Diabetes Survival Skills Education) will improve Outcome: Progressing Goal: Individualized Educational Video(s) Outcome: Progressing   Problem: Coping: Goal: Ability to adjust to condition or change in health will improve Outcome: Progressing   Problem: Fluid Volume: Goal: Ability to maintain a balanced intake and output will improve Outcome: Progressing   Problem: Health Behavior/Discharge Planning: Goal: Ability to identify and utilize available resources and services will improve Outcome: Progressing Goal: Ability to manage health-related needs will improve Outcome: Progressing   Problem: Metabolic: Goal: Ability to maintain appropriate glucose levels will improve Outcome: Progressing   Problem: Nutritional: Goal: Maintenance of adequate nutrition will improve Outcome: Progressing Goal: Progress toward achieving an optimal weight will improve Outcome: Progressing   Problem: Skin Integrity: Goal: Risk for impaired skin  integrity will decrease Outcome: Progressing   Problem: Tissue Perfusion: Goal: Adequacy of tissue perfusion will improve Outcome: Progressing

## 2021-09-14 NOTE — Progress Notes (Signed)
Speech Language Pathology Treatment: Dysphagia;Passy Muir Speaking valve  Patient Details Name: Janet Mitchell MRN: [id number] DOB: [birth year] Today's Date: 09/14/2021 Time: [id number] SLP Time Calculation (min) (ACUTE ONLY): 25 min  Assessment / Plan / Recommendation Clinical Impression  Patienet seen by SLP for skilled treatment session focused on PMV toleration and PO trials. Multiple family members in room when SLP arrived but they left during session. Patient required cues to open her eyes but she would then keep them open without further cues when SLP interacting with her but would close again when not engaged in task. She followed one step verbal commands with 100% accuracy and her yes/no head nod/shakes appeared fairly reliable when asked immediate environment and basic level biographical questions. Trach cuff was already deflated when SLP started working with patient. She only tolerated PMV for brief periods before it would be dislodged from tracheal exhalations. Patient only demonstrated very minimal vocalizing which occurred with respirations and was not purposeful/intentional. SLP did observe patient with PO toleration of small ice chip. She required verbal cues to initiate oral manipulation and attempts at mastication but no swallow was palpated and SLP suctioned out likely secretions mixed with water from ice chip in left side buccal cavity. Patient then closing eyes and less interactive. SLP recommending continued acute care intervention for dysphagia, PMV usage, cognitive-linguistic treatment.     HPI HPI: Patient is a 56 y.o. female with PMH: ESRD on HD MWF, CHF, HTN, DM who presented to [facility] ER as a code stroke with c/o nausea and vomiting upon waking up in the morning along with left sided weakness. She was communicative at [facility] but during stay awaiting transfer to [facility] she required emergent intubation due to inability to protect airway. 6/1 CXR showed Diffuse bilateral lung opacities may  be due to pulmonary edema or multifocal pneumonia. MRI brain showed right thalamic intraparenchymal hemorrhage  with surrounding edema. She was extubated on 6/1 and although initially tolerated well, quickly was seen with poor, ineffective cough, copious secretions unable to clear independently but in no obvious distress and oxygen saturations 100% on 4L nasal cannula. She was reintubated on 6/2 to tracheostomy #6 cuffed Shiley.      SLP Plan  Continue with current plan of care      Recommendations for follow up therapy are one component of a multi-disciplinary discharge planning process, led by the attending physician.  Recommendations may be updated based on patient status, additional functional criteria and insurance authorization.    Recommendations  Diet recommendations: NPO Medication Administration: Via alternative means      Patient may use Passy-Muir Speech Valve: with SLP only PMSV Supervision: Full MD: Please consider changing trach tube to : Cuffless         General recommendations: Rehab consult Oral Care Recommendations: Oral care QID;Staff/trained caregiver to provide oral care Follow Up Recommendations: Acute inpatient rehab (3hours/day) Assistance recommended at discharge: Frequent or constant Supervision/Assistance SLP Visit Diagnosis: Dysphagia, unspecified (R13.10);Aphonia (R49.1) Plan: Continue with current plan of care          Sonia Baller, MA, CCC-SLP Speech Therapy

## 2021-09-14 NOTE — Care Management Important Message (Signed)
Important Message  Patient Details  Name: Nasha Diss MRN: [id number] Date of Birth: 01/01/1966   Medicare Important Message Given:  Yes     Betsabe Iglesia 09/14/2021, 4:08 PM

## 2021-09-15 ENCOUNTER — Encounter (HOSPITAL_COMMUNITY): Payer: Self-pay | Admitting: Internal Medicine

## 2021-09-15 DIAGNOSIS — I629 Nontraumatic intracranial hemorrhage, unspecified: Secondary | ICD-10-CM | POA: Diagnosis not present

## 2021-09-15 LAB — RENAL FUNCTION PANEL
Albumin: 2.7 g/dL — ABNORMAL LOW (ref 3.5–5.0)
Anion gap: 13 (ref 5–15)
BUN: 65 mg/dL — ABNORMAL HIGH (ref 6–20)
CO2: 30 mmol/L (ref 22–32)
Calcium: 9.9 mg/dL (ref 8.9–10.3)
Chloride: 91 mmol/L — ABNORMAL LOW (ref 98–111)
Creatinine, Ser: 6.1 mg/dL — ABNORMAL HIGH (ref 0.44–1.00)
GFR, Estimated: 8 mL/min — ABNORMAL LOW (ref >60)
Glucose, Bld: 121 mg/dL — ABNORMAL HIGH (ref 70–99)
Phosphorus: 5.9 mg/dL — ABNORMAL HIGH (ref 2.5–4.6)
Potassium: 4.5 mmol/L (ref 3.5–5.1)
Sodium: 134 mmol/L — ABNORMAL LOW (ref 135–145)

## 2021-09-15 LAB — CBC
HCT: 29.1 % — ABNORMAL LOW (ref 36.0–46.0)
Hemoglobin: 9.4 g/dL — ABNORMAL LOW (ref 12.0–15.0)
MCH: 25.2 pg — ABNORMAL LOW (ref 26.0–34.0)
MCHC: 32.3 g/dL (ref 30.0–36.0)
MCV: 78 fL — ABNORMAL LOW (ref 80.0–100.0)
Platelets: 175 10*3/uL (ref 150–400)
RBC: 3.73 MIL/uL — ABNORMAL LOW (ref 3.87–5.11)
RDW: 17.2 % — ABNORMAL HIGH (ref 11.5–15.5)
WBC: 9 10*3/uL (ref 4.0–10.5)
nRBC: 0 % (ref 0.0–0.2)

## 2021-09-15 LAB — GLUCOSE, CAPILLARY
Glucose-Capillary: 126 mg/dL — ABNORMAL HIGH (ref 70–99)
Glucose-Capillary: 131 mg/dL — ABNORMAL HIGH (ref 70–99)
Glucose-Capillary: 135 mg/dL — ABNORMAL HIGH (ref 70–99)
Glucose-Capillary: 136 mg/dL — ABNORMAL HIGH (ref 70–99)
Glucose-Capillary: 148 mg/dL — ABNORMAL HIGH (ref 70–99)
Glucose-Capillary: 154 mg/dL — ABNORMAL HIGH (ref 70–99)
Glucose-Capillary: 87 mg/dL (ref 70–99)

## 2021-09-15 MED ORDER — HYDRALAZINE HCL 20 MG/ML IJ SOLN: 10.0000 mg | Freq: Four times a day (QID) | INTRAMUSCULAR | Status: DC | PRN

## 2021-09-15 MED ORDER — CEFAZOLIN SODIUM-DEXTROSE 2-4 GM/100ML-% IV SOLN: 2.0000 g | INTRAVENOUS | Status: AC

## 2021-09-15 MED ORDER — HEPARIN SODIUM (PORCINE) 5000 UNIT/ML IJ SOLN
5000.0000 [IU] | Freq: Three times a day (TID) | INTRAMUSCULAR | Status: DC
Start: 2021-09-17 — End: 2021-10-05
  Administered 2021-09-17 – 2021-10-05 (×51): 5000 [IU] via SUBCUTANEOUS
  Filled 2021-09-15 (×52): qty 1

## 2021-09-15 NOTE — Progress Notes (Signed)
Patient back from dialysis.

## 2021-09-15 NOTE — Progress Notes (Signed)
PROGRESS NOTE                                                                                                                                                                                                             Patient Demographics:    Janet Mitchell, is a 56 y.o. female, DOB - 02-Mar-1966, YHC:[id number]  Outpatient Primary MD for the patient is Lindell Spar, MD    LOS - 23  Admit date - 08/23/2021    Chief Complaint  Patient presents with   Code Stroke       Brief Narrative (HPI from H&P)  56 y.o. female with history of ESRD on HD MWF, CHF, poorly controlled HTN, DM presenting with nausea, vomiting, left side weakness present upon waking.  Scented to [facility] on 08/23/2021 with nausea vomiting left-sided weakness, work-up showed right-sided thalamic hemorrhage she was intubated and transferred to [facility] under ICU care for further treatment.  She was seen by [facility], nephrology for HD, neurology and neurosurgery, external ventricular drain placement by neurosurgery on 08/23/2021.  She also was intubated, trached, during her hospital stay she had tracheostomy site infection for which she is on antibiotic.  She has been transferred to hospitalist service on 09/13/2021 on day 20 one of her hospital stay with plan of going to [place].   Events procedures  5/28 > Presents to [facility] ED, intubated. 5/29 > Transferred to [facility] Echocardiogram with EF 60%, no acute changes. 5/30 > HD, more awake, following commands with RUE/RLE 6/1 No issues overnight, she remains alert and interactive on vent.  Extubated 6/2 failed extubation requiring reintubation. Trached. 6/4 Placed on ATC yesterday afternoon and has tolerated well since 6/5: EVD raised to 20 cm H20 6/7: Failed EVD clamping trial after developing emesis and headache 6/8: Failed EVD clamping due to emesis 6/9: Increase EVD pressure to 25 6/12 back on vent  due to hypoxia from mucus plugging, started back on antibiotics 6/14 VP shunt placed. Antibiotics adjusted based on cultures 6/17-has been off the vent since 09/10/2021, did not require to be on overnight. 6/19.  Transferred to hospitalist service    Subjective:   Patient in bed appears to be in no distress, leaning towards her left side, denies any headache chest or abdominal pain, says she is comfortable.  Answers with nods  of her head.   Assessment  & Plan :   Right thalamic intracranial hemorrhage extending into right midbrain and intraventricular hemorrhage with mild obstructive hydrocephalus requiring external ventricular drain placement by neurosurgery on 08/23/2021 and subsequent VP shunt placement on 09/08/2021.  Hemorrhage was due to uncontrolled hypertension.  Brain bleed contributing to severe left-sided weakness, dysphagia along with toxic encephalopathy.  She has been seen by neurosurgery, neurology, she is currently trached with ongoing left-sided weakness, NG tube feeds, case discussed with neurologist Dr. Leonie Man on 09/13/2021.  Currently goal is supportive care, keep blood pressure systolic under 099, arrange for LTAC placement with most likely continued NG tube, PEG tube would likely cannot be placed for several weeks due to recent VP shunt placement and variations in her GI anatomy.  No antithrombotics.   2.  Inability to maintain airway with acute nonspecific respiratory failure due to #1 above requiring intubation, tracheostomy.  Now with trach collar with tracheostomy site infection.  Currently on antibiotics for treatment of Enterobacter and Staph aureus, antibiotic stop date 09/15/2021 per PCCM team.  Tracheostomy deferred to PCCM.  3.  Dysphagia and left-sided hemiparesis due to #1 above.  Supportive care, LTAC/CIR, currently NG tube feeds, PEG tube requested via both IR and surgery teams.  They are evaluating the patient for possible PEG tube placement if possible.  Note she  has a recent VP shunt placement.  4.  ESRD.  Nephrology following.  On MWF schedule.  5.  Poorly controlled blood pressure.  Case discussed with neurology on 09/13/2021, goal SBP under 160, medications adjusted on 09/13/2021, ARB discontinued due to ESRD and hyperkalemia.  6.  Anemia of chronic disease due to ESRD.  Supportive care.  7.  DM type II.  Lantus + Sliding scale.  Monitor.  Lab Results  Component Value Date   HGBA1C 6.2 (H) 08/23/2021   CBG (last 3)  Recent Labs    09/14/21 2326 09/15/21 0402 09/15/21 0814  GLUCAP 204* 87 136*         Condition - Extremely Guarded  Family Communication  : Husband Joneen Boers [phone] on 09/13/2021 at 9:13 AM  Code Status : Full  Consults  : Neurology, neurosurgery, renal  PUD Prophylaxis : PPI       Disposition Plan  :    Status is: Inpatient  DVT Prophylaxis  :    heparin injection 5,000 Units Start: 09/09/21 0600 SCD's Start: 08/23/21 0925   Lab Results  Component Value Date   PLT 175 09/15/2021    Diet :  Diet Order     None        Inpatient Medications  Scheduled Meds:  amantadine  200 mg Per Tube Weekly   amLODipine  10 mg Per Tube Daily   calcitRIOL  1 mcg Per Tube Q M,W,F   carvedilol  6.25 mg Per NG tube BID WC   chlorhexidine  15 mL Mouth Rinse BID   Chlorhexidine Gluconate Cloth  6 each Topical Q0600   darbepoetin (ARANESP) injection - DIALYSIS  100 mcg Intravenous Q Fri-HD   feeding supplement (PROSource TF)  45 mL Per Tube BID   fiber  1 packet Per Tube BID   heparin injection (subcutaneous)  5,000 Units Subcutaneous Q8H   hydrALAZINE  50 mg Per Tube Q8H   insulin aspart  0-9 Units Subcutaneous Q4H   insulin glargine-yfgn  15 Units Subcutaneous BID   mouth rinse  15 mL Mouth Rinse q12n4p   multivitamin  1 tablet  Per Tube QHS   pantoprazole sodium  40 mg Per Tube Daily   sevelamer carbonate  0.8 g Per Tube TID WC   Continuous Infusions:  sodium chloride 0 mL/hr at 09/06/21 1141    ceFEPime (MAXIPIME) IV Stopped (09/14/21 1734)   feeding supplement (NEPRO CARB STEADY) 50 mL/hr at 09/15/21 0432   PRN Meds:.acetaminophen **OR** acetaminophen (TYLENOL) oral liquid 160 mg/5 mL **OR** acetaminophen, diphenhydrAMINE, fentaNYL (SUBLIMAZE) injection, Gerhardt's butt cream, ipratropium-albuterol, labetalol, ondansetron (ZOFRAN) IV, polyvinyl alcohol, senna-docusate  Time Spent in minutes  30   Lala Lund M.D on 09/15/2021 at 10:59 AM  To page go to [email or web address]   [facility] -  Office  [phone]  See all Orders from today for further details    Objective:   Vitals:   09/15/21 0914 09/15/21 0952 09/15/21 1014 09/15/21 1030  BP: (!) 142/78 (!) 172/94 (!) 153/84   Pulse: 83 78 80 78  Resp: (!) '22 18 19 20  '$ Temp: 98.2 F (36.8 C)     TempSrc: Axillary     SpO2:  99% 99% 98%  Weight:      Height:        Wt Readings from Last 3 Encounters:  09/15/21 99.5 kg  06/03/21 99.1 kg  08/04/20 95.2 kg     Intake/Output Summary (Last 24 hours) at 09/15/2021 1059 Last data filed at 09/15/2021 0800 Gross per 24 hour  Intake 1350 ml  Output --  Net 1350 ml     Physical Exam  Awake, nods head to basic questions, moves right-sided extremities, dense left-sided hemiparesis with some early contractures developing, NG tube in place, trach collar in place [place]. Supple Neck, No JVD,   Symmetrical Chest wall movement, Good air movement bilaterally, CTAB RRR,No Gallops, Rubs or new Murmurs,  +ve B.Sounds, Abd Soft, No tenderness,   No Cyanosis, Clubbing or edema        Data Review:    CBC Recent Labs  Lab 09/09/21 0303 09/10/21 0547 09/11/21 0629 09/12/21 0223 09/13/21 0653 09/15/21 0151  WBC 10.0 8.6 9.0 10.1 10.0 9.0  HGB 9.1* 8.8* 10.6* 9.3* 9.2* 9.4*  HCT 27.8* 27.0* 33.4* 29.3* 28.4* 29.1*  PLT 200 146* 162 185 177 175  MCV 77.4* 76.5* 78.6* 77.3* 77.0* 78.0*  MCH 25.3* 24.9* 24.9* 24.5* 24.9* 25.2*  MCHC 32.7 32.6 31.7 31.7 32.4 32.3   RDW 16.4* 16.2* 16.7* 16.5* 16.7* 17.2*  LYMPHSABS 1.1 1.8  --   --   --   --   MONOABS 0.6 0.8  --   --   --   --   EOSABS 0.1 0.4  --   --   --   --   BASOSABS 0.1 0.1  --   --   --   --     Electrolytes Recent Labs  Lab 09/11/21 0629 09/12/21 0223 09/13/21 0653 09/14/21 0043 09/15/21 0151  NA 133* 134* 134* 135 134*  K 5.4* 5.4* 5.4* 4.3 4.5  CL 92* 93* 91* 92* 91*  CO2 '22 29 27 29 30  '$ GLUCOSE 134* 133* 148* 150* 121*  BUN 32* 56* 86* 37* 65*  CREATININE 3.68* 5.26* 7.22* 4.15* 6.10*  CALCIUM 9.1 9.3 9.7 9.6 9.9  ALBUMIN 3.0* 2.7* 2.7* 2.7* 2.7*    ------------------------------------------------------------------------------------------------------------------ No results for input(s): "CHOL", "HDL", "LDLCALC", "TRIG", "CHOLHDL", "LDLDIRECT" in the last 72 hours.  Lab Results  Component Value Date   HGBA1C 6.2 (H) 08/23/2021     Radiology Reports CT ABDOMEN  PELVIS WO CONTRAST  Result Date: 09/14/2021 CLINICAL DATA:  eval for Perc g-tube placement EXAM: CT ABDOMEN AND PELVIS WITHOUT CONTRAST TECHNIQUE: Multidetector CT imaging of the abdomen and pelvis was performed following the standard protocol without IV contrast. RADIATION DOSE REDUCTION: This exam was performed according to the departmental dose-optimization program which includes automated exposure control, adjustment of the mA and/or kV according to patient size and/or use of iterative reconstruction technique. COMPARISON:  July 26, 2019 FINDINGS: Lower chest: There are some interstitial changes seen. No consolidation, discrete pulmonary nodule or pleural effusion. Hepatobiliary: No focal liver abnormality is seen. Status post cholecystectomy. No biliary dilatation. Pancreas: Unremarkable. No pancreatic ductal dilatation or surrounding inflammatory changes. Spleen: Normal in size without focal abnormality. Adrenals/Urinary Tract: 8 mm lipomatous lesion in the left adrenal, stable. As before, kidneys are atrophied.  10 mm cyst in the right kidney, and is more conspicuous in the present study. Stomach/Bowel: There is an NG tube with its distal end at the distal gastric antrum/pyloric region. Stomach is located in the upper abdomen just inferior to the left lobe of the liver. Transverse colon is inferior to the stomach. There is some stool seen throughout the colon. Bowel-gas pattern is nonobstructive. Mild diverticulosis of the sigmoid colon. Vascular/Lymphatic: Extensive atheromatous calcifications of the abdominal aorta and its branches including the splenic artery, bilateral renal arteries, celiac and SMA as well as iliac arteries. Reproductive: Uterus and bilateral adnexa are unremarkable. There is an IUD in place. Severe atheromatous calcifications of the uterine arteries and their branches. Other: There has been interval removal of the Tenckhoff peritoneal dialysis catheter with interval resolution of the heterogeneous large fluid collection in the ventral aspect of the abdomen and pelvis. There has been interval placement of a VP shunt catheter. 5.5 cm x 2.6 cm soft tissue density at the right anterior pelvic wall, likely related to the previous Tenckhoff catheter placement. Musculoskeletal: Again seen are the postsurgical changes at the left hip joint. IMPRESSION: Body of the stomach is inferior to the left lobe of the liver and the transverse colon is inferior to the body of the stomach. The stomach is amenable for percutaneous G-tube insertion. Atrophy of the bilateral kidneys. Extensive atheromatous calcifications of the abdominal aorta and its branches. Electronically Signed   By: Frazier Richards M.D.   On: 09/14/2021 15:05

## 2021-09-15 NOTE — Progress Notes (Signed)
Inpatient Rehab Admissions Coordinator:    Pt. Continues to demonstrate lack of tolerance for intensity of CIR. TOC working on [employer]. CIR will sign off at this time. If Pt. Demonstrates consistent improvement in tolerance with therapies, MD may reconsult CIR.   Clemens Catholic, [place], [place] Admissions Coordinator  [phone] ([place]) [phone] (office)

## 2021-09-15 NOTE — Plan of Care (Signed)
  Problem: Education: Goal: Knowledge of disease or condition will improve Outcome: Progressing Goal: Knowledge of secondary prevention will improve (SELECT ALL) Outcome: Progressing Goal: Knowledge of patient specific risk factors will improve (INDIVIDUALIZE FOR PATIENT) Outcome: Progressing Goal: Individualized Educational Video(s) Outcome: Progressing   Problem: Coping: Goal: Will verbalize positive feelings about self Outcome: Progressing Goal: Will identify appropriate support needs Outcome: Progressing   Problem: Health Behavior/Discharge Planning: Goal: Ability to manage health-related needs will improve Outcome: Progressing   Problem: Self-Care: Goal: Ability to participate in self-care as condition permits will improve Outcome: Progressing Goal: Verbalization of feelings and concerns over difficulty with self-care will improve Outcome: Progressing Goal: Ability to communicate needs accurately will improve Outcome: Progressing   Problem: Nutrition: Goal: Risk of aspiration will decrease Outcome: Progressing Goal: Dietary intake will improve Outcome: Progressing   Problem: Intracerebral Hemorrhage Tissue Perfusion: Goal: Complications of Intracerebral Hemorrhage will be minimized Outcome: Progressing   Problem: Education: Goal: Knowledge of General Education information will improve Description: Including pain rating scale, medication(s)/side effects and non-pharmacologic comfort measures Outcome: Progressing   Problem: Health Behavior/Discharge Planning: Goal: Ability to manage health-related needs will improve Outcome: Progressing   Problem: Clinical Measurements: Goal: Ability to maintain clinical measurements within normal limits will improve Outcome: Progressing Goal: Will remain free from infection Outcome: Progressing Goal: Diagnostic test results will improve Outcome: Progressing Goal: Respiratory complications will improve Outcome:  Progressing Goal: Cardiovascular complication will be avoided Outcome: Progressing   Problem: Activity: Goal: Risk for activity intolerance will decrease Outcome: Progressing   Problem: Nutrition: Goal: Adequate nutrition will be maintained Outcome: Progressing   Problem: Coping: Goal: Level of anxiety will decrease Outcome: Progressing   Problem: Elimination: Goal: Will not experience complications related to bowel motility Outcome: Progressing Goal: Will not experience complications related to urinary retention Outcome: Progressing   Problem: Pain Managment: Goal: General experience of comfort will improve Outcome: Progressing   Problem: Safety: Goal: Ability to remain free from injury will improve Outcome: Progressing   Problem: Skin Integrity: Goal: Risk for impaired skin integrity will decrease Outcome: Progressing   Problem: Education: Goal: Ability to describe self-care measures that may prevent or decrease complications (Diabetes Survival Skills Education) will improve Outcome: Progressing Goal: Individualized Educational Video(s) Outcome: Progressing   Problem: Coping: Goal: Ability to adjust to condition or change in health will improve Outcome: Progressing   Problem: Fluid Volume: Goal: Ability to maintain a balanced intake and output will improve Outcome: Progressing   Problem: Health Behavior/Discharge Planning: Goal: Ability to identify and utilize available resources and services will improve Outcome: Progressing Goal: Ability to manage health-related needs will improve Outcome: Progressing   Problem: Metabolic: Goal: Ability to maintain appropriate glucose levels will improve Outcome: Progressing   Problem: Nutritional: Goal: Maintenance of adequate nutrition will improve Outcome: Progressing Goal: Progress toward achieving an optimal weight will improve Outcome: Progressing   Problem: Skin Integrity: Goal: Risk for impaired skin  integrity will decrease Outcome: Progressing   Problem: Tissue Perfusion: Goal: Adequacy of tissue perfusion will improve Outcome: Progressing

## 2021-09-15 NOTE — Progress Notes (Signed)
[facility] Progress Note  Subjective:  Last HD on 6/19 with 2.5 kg UF.  Seen and examined on dialysis.  Procedure supervised.  Blood pressure 172/94 and HR 79.  Tolerating goal.  Hypertensive this am and hasn't had meds pre-HD.       Review of systems:    Unable to obtain secondary to reduced responsiveness and trach  Vitals:   09/15/21 0600 09/15/21 0700 09/15/21 0820 09/15/21 0914  BP:  (!) 164/82  (!) 142/78  Pulse:  96  83  Resp:  17 16 (!) 22  Temp:  98.2 F (36.8 C)  98.2 F (36.8 C)  TempSrc:  Axillary  Axillary  SpO2:  96%    Weight: 99.5 kg     Height:        Physical Exam:     Adult female in bed in NAD Trach in place on oxygen via wall to trach no jvd Chest cta bilaterally; normal work of breathing S1S2 no rub Abd soft ntnd  Ext no LE edema appreciated Neuro - awakens (opens eyes) to exam and closes eyes tight and opens them to command LUE AVF in use  Home meds include - asa, tylenol, renavite, cadexomer iodine, phoslo, ceterizine, cholestyramine, hydralazine 10 bid, labetalol 200 bid, losartan 100, psyllium     OP HD: [facility] MWF  4h  97.5kg  450/500  2/2.5 bath  15ga  AVF  Hep none - mircera 100 ug q2 - rocaltrol 1.0 mcg po tiw   Assessment/ Plan Right thalamic intracranial hemorrhage - w/ intraventricular hemorrhage and hydrocephalus. SP ventriculostomy drain placement, then VP shunt surgery was done on 6/14. Per neurosurgery.  ESRD - HD per MWF schedule.  Note hyperkalemia.  S/p Lokelma on 6/19  HTN -  Optimize volume with HD.  Will need her meds after HD Anemia CKD - got aranesp on 6/17 at 100 mcg off schedule.  Is ordered for 100 mcg weekly on Friday   CKD-MBD - On calcitriol.  Hyperphos - resumed phoslo then transitioned to renvela as hypercalcemia per corrected calcium.  On nepro.  Acute hypoxic resp failure/ s/p trach.  Per primary team  Atrial fib - a/c on hold, off BB, in NSR  Disposition - she has a trach in place.   Spoke with HD SW.  She states team is looking for an LTAC - I would agree with same.  She is not able to be placed at an outpatient HD unit   Recent Labs  Lab 09/08/21 0255 09/09/21 0303  HGB 9.7* 9.1*  ALBUMIN 2.9* 2.6*  CALCIUM 10.3 9.3  PHOS 4.9* 5.7*  CREATININE 6.35* 4.92*  K 6.6* 4.8    Inpatient medications:  amantadine  200 mg Per Tube Weekly   calcitRIOL  1 mcg Per Tube Q M,W,F   chlorhexidine  15 mL Mouth Rinse BID   Chlorhexidine Gluconate Cloth  6 each Topical Q0600   Chlorhexidine Gluconate Cloth  6 each Topical Q0600   darbepoetin (ARANESP) injection - DIALYSIS  100 mcg Intravenous Q Wed-HD   feeding supplement (PROSource TF)  45 mL Per Tube TID   fiber  1 packet Per Tube BID   heparin injection (subcutaneous)  5,000 Units Subcutaneous Q8H   insulin aspart  0-9 Units Subcutaneous Q4H   insulin aspart  6 Units Subcutaneous Q4H   insulin glargine-yfgn  15 Units Subcutaneous Daily   mouth rinse  15 mL Mouth Rinse q12n4p   multivitamin  1 tablet Per Tube  QHS   pantoprazole sodium  40 mg Per Tube Daily    sodium chloride 0 mL/hr at 09/06/21 1141   ceFEPime (MAXIPIME) IV     feeding supplement (OSMOLITE 1.5 CAL) 50 mL/hr at 09/09/21 0800   acetaminophen **OR** acetaminophen (TYLENOL) oral liquid 160 mg/5 mL **OR** acetaminophen, diphenhydrAMINE, fentaNYL (SUBLIMAZE) injection, Gerhardt's butt cream, ipratropium-albuterol, labetalol, ondansetron (ZOFRAN) IV, polyvinyl alcohol, senna-docusate     Recent Labs  Lab 09/13/21 0653 09/14/21 0043 09/15/21 0151  HGB 9.2*  --  9.4*  ALBUMIN 2.7* 2.7* 2.7*  CALCIUM 9.7 9.6 9.9  PHOS 7.4* 4.7* 5.9*  CREATININE 7.22* 4.15* 6.10*  K 5.4* 4.3 4.5   Inpatient medications:  amantadine  200 mg Per Tube Weekly   amLODipine  10 mg Per Tube Daily   calcitRIOL  1 mcg Per Tube Q M,W,F   carvedilol  6.25 mg Per NG tube BID WC   chlorhexidine  15 mL Mouth Rinse BID   Chlorhexidine Gluconate Cloth  6 each Topical Q0600    darbepoetin (ARANESP) injection - DIALYSIS  100 mcg Intravenous Q Fri-HD   feeding supplement (PROSource TF)  45 mL Per Tube BID   fiber  1 packet Per Tube BID   heparin injection (subcutaneous)  5,000 Units Subcutaneous Q8H   hydrALAZINE  50 mg Per Tube Q8H   insulin aspart  0-9 Units Subcutaneous Q4H   insulin glargine-yfgn  15 Units Subcutaneous BID   mouth rinse  15 mL Mouth Rinse q12n4p   multivitamin  1 tablet Per Tube QHS   pantoprazole sodium  40 mg Per Tube Daily   sevelamer carbonate  0.8 g Per Tube TID WC    sodium chloride 0 mL/hr at 09/06/21 1141   ceFEPime (MAXIPIME) IV Stopped (09/14/21 1734)   feeding supplement (NEPRO CARB STEADY) 50 mL/hr at 09/15/21 0432   acetaminophen **OR** acetaminophen (TYLENOL) oral liquid 160 mg/5 mL **OR** acetaminophen, diphenhydrAMINE, fentaNYL (SUBLIMAZE) injection, Gerhardt's butt cream, ipratropium-albuterol, labetalol, ondansetron (ZOFRAN) IV, polyvinyl alcohol, senna-docusate   Claudia Desanctis, MD 09/15/2021 10:06 AM

## 2021-09-15 NOTE — Progress Notes (Signed)
33m Omnipaque [facility]  [facility]

## 2021-09-15 NOTE — Progress Notes (Signed)
Patient OTF will check when patient returns.

## 2021-09-15 NOTE — Consult Note (Addendum)
Chief Complaint: Patient was seen in consultation today for evaluation for image-guided gastric tube placement  at the request of Dr Lala Lund, MD Chief Complaint  Patient presents with   Code Stroke    Referring Physician(s): Dr. Lala Lund, MD  Supervising Physician: Sandi Mariscal  Patient Status: [facility] - In-pt  History of Present Illness: Janet Mitchell is a 56 y.o. female with history of ESRD on dialysis 3x weekly, CHF, poorly controlled HTN, and T2DM who presented to [facility] ED on 5/28. Work-up revealed a right thalamic hemorrhage; patient was then transferred to [facility] ICU. An external ventricular drain was placed by neurosurgery on 08/23/21. During her stay, the patient has had a tracheostomy and is currently on a trach collar. She additionally is on antibiotics for an infection at the site of the trach. IR was consulted for image-guided gastric tube placement. Dr. Dwaine Gale has evaluated imaging and Dr Anselm Pancoast has approved patient to have procedure performed possibly on 6/22.   Past Medical History:  Diagnosis Date   Abdominal wall hematoma 12/26/2019   Anemia    Arthritis    CHF (congestive heart failure) ([place])    pt states she has been cleared of heart failure/disease   Chronic cholecystitis with calculus    COVID-19 03/26/2019   COVID-19 virus infection 03/2019   ESRD (end stage renal disease) on dialysis [facility])    Dialysis M-W-F   Headache    "a few/wk" (03/09/2018) - no longer having these   History of blood transfusion 10/2017   "low blood count" (03/09/2018)   Hypertension    Seasonal allergies    Spinal headache    Type II diabetes mellitus ([place])     Past Surgical History:  Procedure Laterality Date   AMPUTATION TOE Left 2013   Great toe   AV FISTULA PLACEMENT Left 07/22/2019   Procedure: LEFT ARM BASILIC ARTERIOVENOUS (AV) FISTULA CREATION;  Surgeon: Rosetta Posner, MD;  Location: [place];  Service: Vascular;  Laterality: Left;   Alligator Left 10/17/2019   Procedure: LEFT SECOND STAGE [place];  Surgeon: Rosetta Posner, MD;  Location: [place];  Service: Vascular;  Laterality: Left;   BIOPSY  10/24/2018   Procedure: BIOPSY;  Surgeon: Daneil Dolin, MD;  [facility];  Service: Endoscopy;;  right and left colon   CATARACT EXTRACTION W/ INTRAOCULAR LENS IMPLANT Right    [place]; [place] N/A 03/09/2018   Procedure: LAPAROSCOPIC CHOLECYSTECTOMY WITH INTRAOPERATIVE CHOLANGIOGRAM ERAS PATHWAY;  Surgeon: Donnie Mesa, MD;  Location: [place];  Service: General;  Laterality: N/A;   COLONOSCOPY N/A 10/24/2018   Procedure: COLONOSCOPY;  Surgeon: Daneil Dolin, MD;  [facility];  Service: Endoscopy;  Laterality: N/A;  1:00pm   EYE SURGERY Left 05/15/2018   Removal of blood in the globe (due to DM)   FLEXIBLE SIGMOIDOSCOPY N/A 11/24/2017   Procedure: FLEXIBLE SIGMOIDOSCOPY;  Surgeon: Daneil Dolin, MD;  [facility];  Service: Endoscopy;  Laterality: N/A;   IR FLUORO GUIDE CV LINE RIGHT  07/17/2019   IR PARACENTESIS  07/09/2019   IR US GUIDE VASC ACCESS RIGHT  07/17/2019   LAPAROSCOPIC CHOLECYSTECTOMY  03/09/2018   LAPAROSCOPIC REVISION VENTRICULAR-PERITONEAL (V-P) SHUNT  09/08/2021   Procedure: LAPAROSCOPIC ASSISTANCE FOR  VENTRICULAR-PERITONEAL (V-P) SHUNT PLACEMENT ;  Surgeon: Vallarie Mare, MD;  Location: [place];  Service: Neurosurgery;;   PERITONEAL CATHETER INSERTION  2017  POLYPECTOMY  10/24/2018   Procedure: POLYPECTOMY;  Surgeon: Daneil Dolin, MD;  [facility];  Service: Endoscopy;;   TOTAL HIP ARTHROPLASTY Left 1997   VENTRICULOPERITONEAL SHUNT N/A 09/08/2021   Procedure: SHUNT INSERTION VENTRICULAR-PERITONEAL;  Surgeon: Vallarie Mare, MD;  Location: [place];  Service: Neurosurgery;  Laterality: N/A;    Allergies: Patient has no known allergies.  Medications: Prior to Admission medications   Medication Sig  Start Date End Date Taking? Authorizing Provider  acetaminophen (TYLENOL) 325 MG tablet Take 650 mg by mouth every 6 (six) hours as needed (pain).   Yes [provider]  aspirin EC 81 MG tablet Take 1 tablet (81 mg total) by mouth daily. 08/06/19  Yes Thurnell Lose, MD  B Complex-C-Folic Acid (RENA-VITE PO) Take 1 tablet by mouth daily.   Yes [provider]  cadexomer iodine (IODOSORB) 0.9 % gel Apply to left foot ulcer once daily. Patient taking differently: Apply 1 application. topically daily. Apply to left foot ulcer 10/23/20  Yes Marzetta Board, DPM  calcitRIOL (ROCALTROL) 0.5 MCG capsule Take 1-2 capsules (0.5-1 mcg total) by mouth See admin instructions. 0.69mg on Mondays through Fridays and take 164m on Saturdays and Sundays 08/29/19  Yes Pahwani, Rinka R, MD  calcium acetate (PHOSLO) 667 MG capsule Take by mouth. 11/20/19  Yes [provider]  cetirizine (ZYRTEC) 10 MG tablet TAKE 1 TABLET BY MOUTH ONCE DAILY. 06/18/21  Yes PaLindell SparMD  hydrALAZINE (APRESOLINE) 10 MG tablet Take 10 mg by mouth 2 (two) times daily.   Yes [provider]  labetalol (NORMODYNE) 200 MG tablet Take 1 tablet (200 mg total) by mouth 2 (two) times daily. 06/03/21  Yes PaLindell SparMD  losartan (COZAAR) 100 MG tablet TAKE ONE TABLET BY MOUTH DAILY Patient taking differently: Take 100 mg by mouth daily. 01/15/21  Yes PaLindell SparMD  Accu-Chek Softclix Lancets lancets Please use it to check blood glucose twice daily and PRN. 06/03/21   PaLindell SparMD  Blood Glucose Monitoring Suppl (ACCU-CHEK GUIDE) w/Device KIT 1 Piece by Does not apply route as directed. 02/28/19   NiCassandria AngerMD  cholestyramine (QUESTRAN) 4 g packet Take 0.5 packets (2 g total) by mouth daily. Patient not taking: Reported on 08/23/2021 11/10/20   PaLindell SparMD  glucose blood (ACCU-CHEK GUIDE) test strip Use as instructed 06/03/21   PaLindell SparMD  psyllium (METAMUCIL) 58.6  % packet Take by mouth. Patient not taking: Reported on 08/23/2021 12/26/19   [provider]     Family History  Problem Relation Age of Onset   Heart disease Mother    Thrombocytopenia Mother        TTP   Heart failure Father    Kidney disease Paternal Grandfather    Colon cancer Neg Hx     Social History   Socioeconomic History   Marital status: Married    Spouse name: Not on file   Number of children: 2   Years of education: Not on file   Highest education level: Not on file  Occupational History   Not on file  Tobacco Use   Smoking status: Never    Passive exposure: Yes   Smokeless tobacco: Never  Vaping Use   Vaping Use: Never used  Substance and Sexual Activity   Alcohol use: Never   Drug use: Never   Sexual activity: Yes    Birth control/protection: I.U.D.  Comment: Mirena IUD  Other Topics Concern   Not on file  Social History Narrative   Not on file   Social Determinants of Health   Financial Resource Strain: Low Risk  (06/10/2020)   Overall Financial Resource Strain (CARDIA)    Difficulty of Paying Living Expenses: Not hard at all  Food Insecurity: No Food Insecurity (06/10/2020)   Hunger Vital Sign    Worried About Running Out of Food in the Last Year: Never true    [place] in the Last Year: Never true  Transportation Needs: No Transportation Needs (06/10/2020)   PRAPARE - Hydrologist (Medical): No    Lack of Transportation (Non-Medical): No  Physical Activity: Inactive (06/10/2020)   Exercise Vital Sign    Days of Exercise per Week: 0 days    Minutes of Exercise per Session: 0 min  Stress: No Stress Concern Present (06/10/2020)   [place] of Stress : Not at all  Social Connections: [place] (06/10/2020)   Social Connection and Isolation Panel [NHANES]    Frequency of Communication with Friends and Family: More  than three times a week    Frequency of Social Gatherings with Friends and Family: More than three times a week    Attends Religious Services: More than 4 times per year    Active Member of [employer] or Organizations: Yes    Attends Music therapist: More than 4 times per year    Marital Status: Married     Review of Systems: A 12 point ROS discussed and pertinent positives are indicated in the HPI above.  All other systems are negative.  Review of Systems  Reason unable to perform ROS: Patient is unable to speak due to s/p stroke and tracheostomy.    Vital Signs: BP 133/85 (BP Location: Right Arm)   Pulse 80   Temp 98.2 F (36.8 C) (Axillary)   Resp 18   Ht '5\' 7"'  (1.702 m)   Wt 219 lb 4.8 oz (99.5 kg)   SpO2 100%   BMI 34.35 kg/m     Physical Exam Vitals and nursing note reviewed.  Constitutional:      General: She is not in acute distress.    Appearance: She is ill-appearing.  HENT:     Mouth/Throat:     Mouth: Mucous membranes are moist.  Cardiovascular:     Rate and Rhythm: Normal rate and regular rhythm.     Pulses: Normal pulses.     Heart sounds: Normal heart sounds.  Pulmonary:     Comments: Breath sounds present bilaterally; patient on trach collar Abdominal:     General: Bowel sounds are normal.  Skin:    General: Skin is warm and dry.     Imaging: CT ABDOMEN PELVIS WO CONTRAST  Result Date: 09/14/2021 CLINICAL DATA:  eval for Perc g-tube placement EXAM: CT ABDOMEN AND PELVIS WITHOUT CONTRAST TECHNIQUE: Multidetector CT imaging of the abdomen and pelvis was performed following the standard protocol without IV contrast. RADIATION DOSE REDUCTION: This exam was performed according to the departmental dose-optimization program which includes automated exposure control, adjustment of the mA and/or kV according to patient size and/or use of iterative reconstruction technique. COMPARISON:  July 26, 2019 FINDINGS: Lower chest: There are some  interstitial changes seen. No consolidation, discrete pulmonary nodule or pleural effusion. Hepatobiliary: No focal liver abnormality is seen. Status post cholecystectomy. No  biliary dilatation. Pancreas: Unremarkable. No pancreatic ductal dilatation or surrounding inflammatory changes. Spleen: Normal in size without focal abnormality. Adrenals/Urinary Tract: 8 mm lipomatous lesion in the left adrenal, stable. As before, kidneys are atrophied. 10 mm cyst in the right kidney, and is more conspicuous in the present study. Stomach/Bowel: There is an NG tube with its distal end at the distal gastric antrum/pyloric region. Stomach is located in the upper abdomen just inferior to the left lobe of the liver. Transverse colon is inferior to the stomach. There is some stool seen throughout the colon. Bowel-gas pattern is nonobstructive. Mild diverticulosis of the sigmoid colon. Vascular/Lymphatic: Extensive atheromatous calcifications of the abdominal aorta and its branches including the splenic artery, bilateral renal arteries, celiac and SMA as well as iliac arteries. Reproductive: Uterus and bilateral adnexa are unremarkable. There is an IUD in place. Severe atheromatous calcifications of the uterine arteries and their branches. Other: There has been interval removal of the Tenckhoff peritoneal dialysis catheter with interval resolution of the heterogeneous large fluid collection in the ventral aspect of the abdomen and pelvis. There has been interval placement of a VP shunt catheter. 5.5 cm x 2.6 cm soft tissue density at the right anterior pelvic wall, likely related to the previous Tenckhoff catheter placement. Musculoskeletal: Again seen are the postsurgical changes at the left hip joint. IMPRESSION: Body of the stomach is inferior to the left lobe of the liver and the transverse colon is inferior to the body of the stomach. The stomach is amenable for percutaneous G-tube insertion. Atrophy of the bilateral kidneys.  Extensive atheromatous calcifications of the abdominal aorta and its branches. Electronically Signed   By: Frazier Richards M.D.   On: 09/14/2021 15:05   DG CHEST PORT 1 VIEW  Result Date: 09/06/2021 CLINICAL DATA:  Fever and secretions. EXAM: PORTABLE CHEST 1 VIEW COMPARISON:  September 03 2021 FINDINGS: The heart size and mediastinal contours are stable. Tracheostomy tube, feeding tube are unchanged compared prior exam. Diffuse hazy opacity of bilateral lungs are identified stable. There is increased patchy consolidation of right lung base compared prior exam. There is no pleural effusion. The visualized skeletal structures are unremarkable. IMPRESSION: 1. Diffuse hazy opacity of bilateral lungs are identified stable. 2. Increased patchy consolidation of right lung base, suspicious for superimposed pneumonia. Electronically Signed   By: Abelardo Diesel M.D.   On: 09/06/2021 08:42   CT HEAD WO CONTRAST (5MM)  Result Date: 09/06/2021 CLINICAL DATA:  Follow-up right thalamic hemorrhage. EXAM: CT HEAD WITHOUT CONTRAST TECHNIQUE: Contiguous axial images were obtained from the base of the skull through the vertex without intravenous contrast. RADIATION DOSE REDUCTION: This exam was performed according to the departmental dose-optimization program which includes automated exposure control, adjustment of the mA and/or kV according to patient size and/or use of iterative reconstruction technique. COMPARISON:  Head CT dated 09/03/2021. FINDINGS: Brain: No significant interval change in the right thalamic hemorrhage with associated mass effect and mild compression of the third ventricle. There is interval increase in the size of the lateral ventricles compared to the CT of 09/03/2021. The lateral ventricles however appears similar in size to the CT of 08/23/2021. Right frontal approach ventriculostomy shunt with tip in the left lateral ventricle. No new hemorrhage. No extra-axial fluid collection. Vascular: No hyperdense vessel  or unexpected calcification. Skull: Right frontal burr hole and ventriculostomy. No acute calvarial pathology. Sinuses/Orbits: Diffuse mucoperiosteal thickening of paranasal sinuses with opacification of the sphenoid sinuses. No air-fluid. The mastoid air cells are clear.  A nasogastric tube is partially visualized. Other: None IMPRESSION: 1. No significant interval change in the right thalamic hemorrhage with associated mass effect and mild compression of the third ventricle. 2. Slight interval increase in the size of the lateral ventricles compared to CT of 09/03/2021 which may represent mild hydrocephalus. The lateral ventricles are however similar in size to the CT of 08/23/2021. Continued follow-up recommended. Right frontal approach ventriculostomy shunt with tip in the left lateral ventricle. Electronically Signed   By: Anner Crete M.D.   On: 09/06/2021 03:23   DG CHEST PORT 1 VIEW  Result Date: 09/03/2021 CLINICAL DATA:  [id number] hypoxemia, aspiration into airway EXAM: PORTABLE CHEST 1 VIEW COMPARISON:  Chest x-ray from yesterday FINDINGS: Again seen are the tracheostomy tube and feeding tube. Cardiomegaly. There has been improvement of the right basal atelectasis. Left basilar atelectasis/infiltration persists. There are some heterogeneous opacities seen in the lungs likely pulmonary edema without significant interval change. IMPRESSION: Cardiomegaly. Partial resolution of the right basilar atelectasis. Minor atelectasis/infiltration at the left lung base, stable. Heterogeneous opacities in the lungs without significant interval change likely on the basis of pulmonary edema. Electronically Signed   By: Frazier Richards M.D.   On: 09/03/2021 10:18   CT HEAD WO CONTRAST (5MM)  Result Date: 09/03/2021 CLINICAL DATA:  Hydrocephalus EXAM: CT HEAD WITHOUT CONTRAST TECHNIQUE: Contiguous axial images were obtained from the base of the skull through the vertex without intravenous contrast. RADIATION  DOSE REDUCTION: This exam was performed according to the departmental dose-optimization program which includes automated exposure control, adjustment of the mA and/or kV according to patient size and/or use of iterative reconstruction technique. COMPARISON:  CT head September 02, 2021. FINDINGS: Brain: No substantial change in the right thalamic hemorrhage. Similar stern edema and mass effect similar position of a right frontal approach ventriculostomy catheter with decompressed ventricular system. No evidence of acute large vascular territory infarct. Vascular: No hyperdense vessel identified. Skull: No acute fracture. Sinuses/Orbits: Air-fluid levels and frothy secretions in bilateral sphenoid sinuses. No acute orbital findings Other: No mastoid effusions. IMPRESSION: 1. No substantial change in right thalamic hemorrhage. Similar surrounding edema and mass effect. 2. Decompressed ventricles with shunt in place. Electronically Signed   By: Margaretha Sheffield M.D.   On: 09/03/2021 08:15   DG Abd 1 View  Result Date: 09/02/2021 CLINICAL DATA:  Feeding tube placement. EXAM: ABDOMEN - 1 VIEW COMPARISON:  08/27/2021 FINDINGS: 1504 hours. Feeding tube tip is in the distal stomach at the pylorus and directed distally towards the duodenum. Nonspecific bowel gas pattern noted in the visualized upper abdomen. IMPRESSION: Feeding tube tip is in the distal stomach at the pylorus and directed towards the duodenum. Electronically Signed   By: Misty Stanley M.D.   On: 09/02/2021 15:17   CT HEAD WO CONTRAST (5MM)  Result Date: 09/02/2021 CLINICAL DATA:  Stroke suspected EXAM: CT HEAD WITHOUT CONTRAST TECHNIQUE: Contiguous axial images were obtained from the base of the skull through the vertex without intravenous contrast. RADIATION DOSE REDUCTION: This exam was performed according to the departmental dose-optimization program which includes automated exposure control, adjustment of the mA and/or kV according to patient size and/or  use of iterative reconstruction technique. COMPARISON:  08/23/2021 CT, correlation is also made with 08/25/2021 MRI head FINDINGS: Brain: Redemonstrated hematoma centered in the right thalamus, which measures 2.4 x 3.7 x 3.8 cm (AP x TR x CC) (series 7, image 28 and series 5, image 50), previously 2.3 x 3.7 x 3.8 cm  when remeasured similarly. Increased hypodensity around the hemorrhage, likely increased edema compared to 08/23/2021 but overall similar to the 08/25/2021 MRI. No evidence of acute large vascular territory infarct. Previously noted intraventricular hemorrhage is not definitively seen on the current exam. Unchanged mass effect on the right lateral ventricle and third ventricle, with right frontal approach ventriculostomy catheter in unchanged position, terminating in the left basal ganglia. Overall unchanged size of the ventricles. Vascular: No hyperdense vessel. Atherosclerotic calcifications in the intracranial carotid and vertebral arteries. Skull: Right frontal burr hole.  No acute osseous abnormality. Sinuses/Orbits: Mucosal thickening and bubbly fluid in the sphenoid sinuses. Otherwise clear. Other: The mastoids are well aerated. IMPRESSION: 1. Overall unchanged size of the hematoma centered in the right thalamus. Surrounding edema is increased compared to the prior CT but appears similar to the 08/25/2021 MRI when accounting for differences in technique. 2. Overall unchanged size and configuration of the shunted ventricular system. Electronically Signed   By: Merilyn Baba M.D.   On: 09/02/2021 12:42   DG Chest 1 View  Result Date: 09/02/2021 CLINICAL DATA:  Respiratory abnormality. EXAM: CHEST  1 VIEW COMPARISON:  AP chest 08/27/2021 FINDINGS: Tracheostomy tube overlies the midline trachea. Mildly decreased lung volumes. Cardiac silhouette and mediastinal contours are unchanged. Enteric tube again descends below the diaphragm with the tip excluded by collimation. Right lower lung horizontal  linear likely platelike atelectasis is new from prior. Bibasilar heterogeneous airspace opacities are similar to prior. No pneumothorax is seen. Cholecystectomy clips. IMPRESSION: 1. Tracheostomy tube in appropriate position. 2. There are again moderately decreased lung volumes. Heterogeneous bilateral basilar airspace opacities may represent atelectasis pulmonary edema or pneumonia. No significant interval change. Electronically Signed   By: Yvonne Kendall M.D.   On: 09/02/2021 10:53   DG Chest Port 1 View  Result Date: 08/27/2021 CLINICAL DATA:  Post tracheostomy placement. EXAM: PORTABLE CHEST 1 VIEW COMPARISON:  Radiograph earlier today. FINDINGS: New tracheostomy tube with tip overlying the tracheal air column at the thoracic inlet. Enteric tube with tip below the diaphragm not included in the field of view. Stable cardiomegaly. Unchanged mediastinal contours. Persistent low lung volumes. Heterogeneous bilateral lung opacities which may represent pulmonary edema or multifocal infection, without significant interval change. No pneumothorax or evident pneumomediastinum. IMPRESSION: 1. New tracheostomy tube with tip overlying the tracheal air column at the thoracic inlet. 2. Heterogeneous bilateral lung opacities which may represent pulmonary edema or multifocal infection, without significant interval change. Electronically Signed   By: Keith Rake M.D.   On: 08/27/2021 17:03   DG Abd Portable 1V  Result Date: 08/27/2021 CLINICAL DATA:  56 year old female status post feeding tube placement. EXAM: PORTABLE ABDOMEN - 1 VIEW COMPARISON:  Abdominal radiograph 08/23/2021. FINDINGS: Previously noted nasogastric tube appears to have been removed. New feeding tube noted with tip projecting over the expected location of the antral pre-pyloric region of the stomach. Surgical clips project over the right upper quadrant of the abdomen. Visualized bowel gas pattern is nonobstructive. IMPRESSION: 1. Tip of feeding  tube is likely in the antral pre-pyloric region of the stomach. Electronically Signed   By: Vinnie Langton M.D.   On: 08/27/2021 11:58   DG CHEST PORT 1 VIEW  Result Date: 08/27/2021 CLINICAL DATA:  Endotracheal tube placement. EXAM: PORTABLE CHEST 1 VIEW COMPARISON:  Chest radiograph August 26, 2021 FINDINGS: Endotracheal tube with tip projecting over the distal thoracic trachea 2.5 cm from the carina. Similar cardiac enlargement and central vascular prominence. Slightly worsened bilateral interstitial and  airspace opacities. Unchanged elevation of the right hemidiaphragm. No visible pleural effusion or pneumothorax. No acute osseous abnormality. IMPRESSION: Endotracheal tube with tip overlying the distal thoracic trachea 2.5 cm from the carina. Similar cardiomegaly and likely pulmonary edema. Electronically Signed   By: Dahlia Bailiff M.D.   On: 08/27/2021 10:04   DG CHEST PORT 1 VIEW  Result Date: 08/26/2021 CLINICAL DATA:  Diffuse bilateral infiltrates, dialysis patient. EXAM: PORTABLE CHEST 1 VIEW COMPARISON:  Aug 23, 2021. FINDINGS: EKG leads project over the chest. Endotracheal tube terminates 3.4 cm from the carina. Gastric tube tip in the mid to distal stomach, side port below GE junction. Cardiomediastinal contours remain enlarged. Hilar structures remain indistinct. Increased interstitial markings throughout the chest remain evident with diminished alveolar opacity with ill-defined characteristics seen throughout the chest. Basilar airspace disease may be slightly increased with blunting of LEFT costodiaphragmatic sulcus and RIGHT costodiaphragmatic sulcus. No pneumothorax. On limited assessment there is no acute skeletal finding. IMPRESSION: 1. Continued bilateral interstitial and airspace disease with diminished alveolar opacities. Findings favor slight improvement with respect to pulmonary edema but with developing basilar airspace disease and small effusions. Correlate with any signs of  infection. 2. Cardiomegaly as before. Electronically Signed   By: Zetta Bills M.D.   On: 08/26/2021 09:56   MR BRAIN WO CONTRAST  Result Date: 08/25/2021 CLINICAL DATA:  Neuro deficit, acute, stroke suspected. Nontraumatic intracranial hemorrhage. Altered mental status. EXAM: MRI HEAD WITHOUT CONTRAST TECHNIQUE: Multiplanar, multiecho pulse sequences of the brain and surrounding structures were obtained without intravenous contrast. COMPARISON:  Multiple CT studies 08/23/2021.  MRI 04/02/2019 FINDINGS: Brain: Right thalamic intraparenchymal hematoma as seen previously, proximally maximal dimension 3.6 cm. I do not believe the hematoma has increased in size. There is surrounding edema including extension into the right midbrain and pons. Small amount of intraventricular blood as seen previously. Right frontal ventriculostomy enters the frontal horn of the right lateral ventricle and crosses the midline towards the left. Ventricular size is stable. Numerous other punctate foci of hemosiderin deposition scattered throughout the brain consistent with old hypertensive microhemorrhages. There is a subcentimeter acute infarction affecting the cingulate gyrus on the right. Vascular: Major vessels at the base of the brain show flow. Skull and upper cervical spine: Otherwise negative Sinuses/Orbits: Clear/normal Other: None IMPRESSION: Comparing techniques, no change in size of the right thalamic intraparenchymal hemorrhage is suspected, maximal dimension 3.6 cm by MRI. Surrounding edema, likely slightly more prominent than on the most recent CT. Right ventriculostomy remains in place without evidence of developing hydrocephalus. Small amount of intraventricular blood appears similar. Subcentimeter acute infarction of the right cingulate gyrus. Old small vessel insults with hemosiderin deposition consistent with hypertensive microangiopathy. Electronically Signed   By: Nelson Chimes M.D.   On: 08/25/2021 15:35   [place]  EKG SITE RITE  Result Date: 08/24/2021 If Site Rite image not attached, placement could not be confirmed due to current cardiac rhythm.  EEG adult  Result Date: 08/24/2021 Lora Havens, MD     08/24/2021 10:24 AM Patient Name: Gerica Koble Mitchell MRN: [id number] Epilepsy Attending: Lora Havens Referring Physician/Provider: Amie Portland, MD Date: 08/24/2021 Duration: 23.47 mins Patient history: 29 old female with nontraumatic intracerebral hemorrhage affecting the left side and altered mental status.  EEG evaluate for seizure. Level of alertness: Awake, asleep AEDs during EEG study: None Technical aspects: This EEG study was done with scalp electrodes positioned according to the 10-20 International system of electrode placement. Electrical activity was acquired at  a sampling rate of '500Hz'  and reviewed with a high frequency filter of '70Hz'  and a low frequency filter of '1Hz' . EEG data were recorded continuously and digitally stored. Description: The posterior dominant rhythm consists of 8 Hz activity of moderate voltage (25-35 uV) seen predominantly in posterior head regions, symmetric and reactive to eye opening and eye closing. Sleep was characterized by vertex waves, sleep spindles (12 to 14 Hz), maximal frontocentral region. EEG showed intermittent generalized 3 to 6 Hz theta-delta slowing. Hyperventilation and photic stimulation were not performed.   ABNORMALITY - Intermittent slow, generalized IMPRESSION: This study is suggestive of mild diffuse encephalopathy, nonspecific etiology. No seizures or epileptiform discharges were seen throughout the recording. [place]   CT HEAD WO CONTRAST (5MM)  Result Date: 08/23/2021 CLINICAL DATA:  Hydrocephalus, EVD placement EXAM: CT HEAD WITHOUT CONTRAST TECHNIQUE: Contiguous axial images were obtained from the base of the skull through the vertex without intravenous contrast. RADIATION DOSE REDUCTION: This exam was performed according to the  departmental dose-optimization program which includes automated exposure control, adjustment of the mA and/or kV according to patient size and/or use of iterative reconstruction technique. COMPARISON:  08/23/2021 9:54 a.m. FINDINGS: Brain: Interval placement of a right frontal approach ventriculostomy catheter, which terminates in the left basal ganglia. Interval slight decrease in the size of the lateral ventricles and third ventricle. Small amount of pneumocephalus along the right frontal convexity, not unexpected post EVD placement. Redemonstrated acute hematoma in the right thalamus, which measures 3.7 x 2.3 x 3.8 cm (AP x TR x CC) (series 3, image 21 and series 5, image 41), previously 3.6 x 2.5 x 3.8 cm, overall unchanged compared to earlier on the same day. Again noted is a small amount intraventricular hemorrhage in the cerebral aqueduct and fourth ventricle as well as layering in the occipital horns. Unchanged mass effect on the right lateral ventricle and third ventricle. Vascular: No hyperdense vessel. Atherosclerotic calcifications in the intracranial carotid and vertebral arteries. Skull: Right frontal burr hole. Negative for fracture or focal lesion. Sinuses/Orbits: No acute finding. Other: Air within the right frontal soft tissues, not unexpected postoperatively. IMPRESSION: 1. Status post interval placement of a right frontal approach ventriculostomy catheter, with slight interval decrease in the size of the lateral and third ventricles. 2. Overall unchanged size of the right thalamic hematoma compared to earlier on the same day. Electronically Signed   By: Merilyn Baba M.D.   On: 08/23/2021 22:52   DG Abd Portable 1V  Result Date: 08/23/2021 CLINICAL DATA:  NG tube placement. EXAM: PORTABLE ABDOMEN - 1 VIEW COMPARISON:  CT, 07/26/2019. FINDINGS: Nasogastric tube passes well below the diaphragm, tip in the mid to distal stomach. Normal bowel gas pattern. IMPRESSION: Well-positioned  nasal/orogastric tube. Electronically Signed   By: Lajean Manes M.D.   On: 08/23/2021 15:59   ECHOCARDIOGRAM COMPLETE  Result Date: 08/23/2021    ECHOCARDIOGRAM REPORT   Patient Name:   KYLEAH PENSABENE  [facility] Date of Exam: 08/23/2021 Medical Rec #:  [id number]          Height:       67.0 in Accession #:    [id number]         Weight:       230.4 lb Date of Birth:  12/12/1965           BSA:          2.148 m Patient Age:    32 years  BP:           134/62 mmHg Patient Gender: F                  HR:           60 bpm. Exam Location:  Inpatient Procedure: 2D Echo, Color Doppler and Cardiac Doppler Indications:    Stroke i63.9  History:        Patient has prior history of Echocardiogram examinations, most                 recent 07/09/2019. CHF; Risk Factors:Hypertension and Diabetes.  Sonographer:    Raquel Sarna Senior RDCS Referring Phys: [id number] ASHISH ARORA  Sonographer Comments: Scanned supine on artificial respirator. IMPRESSIONS  1. Left ventricular ejection fraction, by estimation, is 60 to 65%. The left ventricle has normal function. The left ventricle has no regional wall motion abnormalities. There is severe left ventricular hypertrophy. Left ventricular diastolic parameters  are consistent with Grade II diastolic dysfunction (pseudonormalization). Elevated left atrial pressure.  2. Right ventricular systolic function is normal. The right ventricular size is normal.  3. Left atrial size was moderately dilated.  4. The mitral valve is normal in structure. Trivial mitral valve regurgitation. No evidence of mitral stenosis.  5. The aortic valve is tricuspid. Aortic valve regurgitation is not visualized. Aortic valve sclerosis is present, with no evidence of aortic valve stenosis.  6. The inferior vena cava is normal in size with greater than 50% respiratory variability, suggesting right atrial pressure of 3 mmHg. FINDINGS  Left Ventricle: Left ventricular ejection fraction, by estimation, is 60 to 65%. The left  ventricle has normal function. The left ventricle has no regional wall motion abnormalities. The left ventricular internal cavity size was normal in size. There is  severe left ventricular hypertrophy. Left ventricular diastolic parameters are consistent with Grade II diastolic dysfunction (pseudonormalization). Elevated left atrial pressure. Right Ventricle: The right ventricular size is normal. Right ventricular systolic function is normal. Left Atrium: Left atrial size was moderately dilated. Right Atrium: Right atrial size was normal in size. Pericardium: There is no evidence of pericardial effusion. Mitral Valve: The mitral valve is normal in structure. Mild mitral annular calcification. Trivial mitral valve regurgitation. No evidence of mitral valve stenosis. Tricuspid Valve: The tricuspid valve is normal in structure. Tricuspid valve regurgitation is trivial. No evidence of tricuspid stenosis. Aortic Valve: The aortic valve is tricuspid. Aortic valve regurgitation is not visualized. Aortic valve sclerosis is present, with no evidence of aortic valve stenosis. Pulmonic Valve: The pulmonic valve was normal in structure. Pulmonic valve regurgitation is not visualized. No evidence of pulmonic stenosis. Aorta: The aortic root is normal in size and structure. Venous: The inferior vena cava is normal in size with greater than 50% respiratory variability, suggesting right atrial pressure of 3 mmHg. IAS/Shunts: No atrial level shunt detected by color flow Doppler.  LEFT VENTRICLE PLAX 2D LVIDd:         4.90 cm   Diastology LVIDs:         3.10 cm   LV e' medial:    3.81 cm/s LV PW:         1.40 cm   LV E/e' medial:  30.4 LV IVS:        1.50 cm   LV e' lateral:   5.11 cm/s LVOT diam:     2.00 cm   LV E/e' lateral: 22.7 LV SV:         89 LV  SV Index:   41 LVOT Area:     3.14 cm  RIGHT VENTRICLE RV S prime:     7.83 cm/s TAPSE (M-mode): 2.2 cm LEFT ATRIUM             Index        RIGHT ATRIUM           Index LA diam:         3.80 cm 1.77 cm/m   RA Area:     15.80 cm LA Vol (A2C):   83.7 ml 38.98 ml/m  RA Volume:   40.40 ml  18.81 ml/m LA Vol (A4C):   65.0 ml 30.27 ml/m LA Biplane Vol: 78.2 ml 36.41 ml/m  AORTIC VALVE LVOT Vmax:   97.40 cm/s LVOT Vmean:  63.800 cm/s LVOT VTI:    0.283 m  AORTA Ao Root diam: 3.20 cm Ao Asc diam:  3.20 cm MV E velocity: 116.00 cm/s MV A velocity: 78.40 cm/s   SHUNTS MV E/A ratio:  1.48         Systemic VTI:  0.28 m                             Systemic Diam: 2.00 cm Kirk Ruths MD Electronically signed by Kirk Ruths MD Signature Date/Time: 08/23/2021/10:50:25 AM    Final    CT HEAD WO CONTRAST (5MM)  Result Date: 08/23/2021 CLINICAL DATA:  Stroke, hemorrhagic EXAM: CT HEAD WITHOUT CONTRAST TECHNIQUE: Contiguous axial images were obtained from the base of the skull through the vertex without intravenous contrast. RADIATION DOSE REDUCTION: This exam was performed according to the departmental dose-optimization program which includes automated exposure control, adjustment of the mA and/or kV according to patient size and/or use of iterative reconstruction technique. COMPARISON:  CT head May 29, 23. FINDINGS: Brain: Significantly increased size of a an acute hematoma in the right thalamus, now measuring 3.6 x 2.5 x 3.8 Cm (previously 2.9 x 1.7 x 3.1 cm). Small volume intraventricular extension of hemorrhage with hemorrhage noted in the cerebral aqueduct and fourth ventricle. Regional mass effect with effacement of the third ventricle. No evidence of acute large vascular territory infarct. Development of rounding of the temporal horns, compatible with mild hydrocephalus. Vascular: Residual contrast within the vascular system. Calcific atherosclerosis Skull: No acute fracture. Sinuses/Orbits: Mild paranasal sinus mucosal thickening. No acute orbital findings. Other: No mastoid effusions. IMPRESSION: 1. Interval significant increase in size of a an acute hematoma in the right thalamus, now  measuring 3.6 x 2.5 x 3.8 cm (previously 2.9 x 1.7 x 3.1 cm). Small volume intraventricular extension of hemorrhage. 2. Interval development of mild hydrocephalus. These results will be called to the ordering clinician or representative by the Radiologist Assistant, and communication documented in the PACS or [employer]. Electronically Signed   By: Margaretha Sheffield M.D.   On: 08/23/2021 10:08   DG Chest Portable 1 View  Result Date: 08/23/2021 CLINICAL DATA:  Endotracheal and nasogastric tube placement Stroke ESRD Diabetes EXAM: PORTABLE CHEST - 1 VIEW COMPARISON:  07/09/2019 FINDINGS: Unchanged cardiomegaly. Diffuse bilateral lung opacities. Endotracheal tube tip located 1.8 cm of the carina. Nasogastric tube terminates in the left upper quadrant. IMPRESSION: 1. Mild cardiomegaly. 2. Diffuse bilateral lung opacities may be due to pulmonary edema or multifocal pneumonia. Electronically Signed   By: Miachel Roux M.D.   On: 08/23/2021 07:51   CT ANGIO HEAD W OR WO CONTRAST  Result Date: 08/23/2021  CLINICAL DATA:  Code stroke for left-sided weakness EXAM: CT ANGIOGRAPHY HEAD AND NECK TECHNIQUE: Multidetector CT imaging of the head and neck was performed using the standard protocol during bolus administration of intravenous contrast. Multiplanar CT image reconstructions and MIPs were obtained to evaluate the vascular anatomy. Carotid stenosis measurements (when applicable) are obtained utilizing NASCET criteria, using the distal internal carotid diameter as the denominator. RADIATION DOSE REDUCTION: This exam was performed according to the departmental dose-optimization program which includes automated exposure control, adjustment of the mA and/or kV according to patient size and/or use of iterative reconstruction technique. CONTRAST:  164m OMNIPAQUE IOHEXOL 350 MG/ML SOLN COMPARISON:  Noncontrast head CT from earlier today FINDINGS: CTA NECK FINDINGS Aortic arch: No acute finding Right carotid system:  Mild atheromatous plaque at the bifurcation. No stenosis or ulceration. Left carotid system: Mild calcified plaque at the bifurcation. No stenosis or ulceration. Vertebral arteries: Proximal subclavian atherosclerosis without flow reducing stenosis. Codominant vertebral arteries there are smoothly contoured and widely patent to the dura. Skeleton: No acute or aggressive finding Other neck: No acute finding Upper chest: Fine interstitial opacity at the apices symmetrically. Review of the MIP images confirms the above findings CTA HEAD FINDINGS Anterior circulation: Calcified plaque along the carotid siphons. No occlusion, beading, or proximal flow limiting stenosis. Negative for aneurysm. Posterior circulation: Calcified plaque along the V4 segments. No branch occlusion, beading, or aneurysm. No evidence of vascular malformation. Equivocal for spot sign in the lower aspect of the hematoma. Venous sinuses: Diffusely patent Anatomic variants: None significant Review of the MIP images confirms the above findings IMPRESSION: 1. No arterial lesion underlying the right thalamic hematoma. Equivocal for spot sign which would be an adverse finding suggesting continued growth. 2. Atherosclerosis without flow limiting stenosis of major vessels. 3. Interstitial opacity at the apices, age-indeterminate. Recommend chest radiograph. Electronically Signed   By: JJorje GuildM.D.   On: 08/23/2021 07:12   CT ANGIO NECK W OR WO CONTRAST  Result Date: 08/23/2021 CLINICAL DATA:  Code stroke for left-sided weakness EXAM: CT ANGIOGRAPHY HEAD AND NECK TECHNIQUE: Multidetector CT imaging of the head and neck was performed using the standard protocol during bolus administration of intravenous contrast. Multiplanar CT image reconstructions and MIPs were obtained to evaluate the vascular anatomy. Carotid stenosis measurements (when applicable) are obtained utilizing NASCET criteria, using the distal internal carotid diameter as the  denominator. RADIATION DOSE REDUCTION: This exam was performed according to the departmental dose-optimization program which includes automated exposure control, adjustment of the mA and/or kV according to patient size and/or use of iterative reconstruction technique. CONTRAST:  1017mOMNIPAQUE IOHEXOL 350 MG/ML SOLN COMPARISON:  Noncontrast head CT from earlier today FINDINGS: CTA NECK FINDINGS Aortic arch: No acute finding Right carotid system: Mild atheromatous plaque at the bifurcation. No stenosis or ulceration. Left carotid system: Mild calcified plaque at the bifurcation. No stenosis or ulceration. Vertebral arteries: Proximal subclavian atherosclerosis without flow reducing stenosis. Codominant vertebral arteries there are smoothly contoured and widely patent to the dura. Skeleton: No acute or aggressive finding Other neck: No acute finding Upper chest: Fine interstitial opacity at the apices symmetrically. Review of the MIP images confirms the above findings CTA HEAD FINDINGS Anterior circulation: Calcified plaque along the carotid siphons. No occlusion, beading, or proximal flow limiting stenosis. Negative for aneurysm. Posterior circulation: Calcified plaque along the V4 segments. No branch occlusion, beading, or aneurysm. No evidence of vascular malformation. Equivocal for spot sign in the lower aspect of the hematoma. Venous sinuses:  Diffusely patent Anatomic variants: None significant Review of the MIP images confirms the above findings IMPRESSION: 1. No arterial lesion underlying the right thalamic hematoma. Equivocal for spot sign which would be an adverse finding suggesting continued growth. 2. Atherosclerosis without flow limiting stenosis of major vessels. 3. Interstitial opacity at the apices, age-indeterminate. Recommend chest radiograph. Electronically Signed   By: Jorje Guild M.D.   On: 08/23/2021 07:12   CT HEAD CODE STROKE WO CONTRAST  Result Date: 08/23/2021 CLINICAL DATA:  Code  stroke.  Left facial droop EXAM: CT HEAD WITHOUT CONTRAST TECHNIQUE: Contiguous axial images were obtained from the base of the skull through the vertex without intravenous contrast. RADIATION DOSE REDUCTION: This exam was performed according to the departmental dose-optimization program which includes automated exposure control, adjustment of the mA and/or kV according to patient size and/or use of iterative reconstruction technique. COMPARISON:  Brain MRI 04/02/2019 FINDINGS: Brain: 33 x 18 x 17 mm acute hemorrhage centered in the right thalamus with mild intraventricular extension into the right lateral and fourth ventricle. This location is usually related to hypertensive bleeds. The hematoma tracks towards the midbrain on the right. No evidence of regional acute infarction or mass. No hydrocephalus. Vascular: No hyperdense vessel or unexpected calcification. Skull: Normal. Negative for fracture or focal lesion. Sinuses/Orbits: No acute finding.  Bilateral cataract resection Other: Critical Value/emergent results were called by telephone at the time of interpretation on 08/23/2021 at 7:03 am to provider [facility] , who is already aware IMPRESSION: 5 cc acute hematoma in the right thalamus with intraventricular extension. No hydrocephalus. Electronically Signed   By: Jorje Guild M.D.   On: 08/23/2021 07:04    Labs:  CBC: Recent Labs    09/11/21 0629 09/12/21 0223 09/13/21 0653 09/15/21 0151  WBC 9.0 10.1 10.0 9.0  HGB 10.6* 9.3* 9.2* 9.4*  HCT 33.4* 29.3* 28.4* 29.1*  PLT 162 185 177 175    COAGS: Recent Labs    08/23/21 0632  INR 1.2  APTT 35    BMP: Recent Labs    09/12/21 0223 09/13/21 0653 09/14/21 0043 09/15/21 0151  NA 134* 134* 135 134*  K 5.4* 5.4* 4.3 4.5  CL 93* 91* 92* 91*  CO2 '29 27 29 30  ' GLUCOSE 133* 148* 150* 121*  BUN 56* 86* 37* 65*  CALCIUM 9.3 9.7 9.6 9.9  CREATININE 5.26* 7.22* 4.15* 6.10*  GFRNONAA 9* 6* 12* 8*    LIVER FUNCTION  TESTS: Recent Labs    08/23/21 8841 09/06/21 0549 09/12/21 0223 09/13/21 0653 09/14/21 0043 09/15/21 0151  BILITOT 0.1*  --   --   --   --   --   AST 19  --   --   --   --   --   ALT 15  --   --   --   --   --   ALKPHOS 93  --   --   --   --   --   PROT 7.5  --   --   --   --   --   ALBUMIN 3.8   < > 2.7* 2.7* 2.7* 2.7*   < > = values in this interval not displayed.    TUMOR MARKERS: No results for input(s): "AFPTM", "CEA", "CA199", "CHROMGRNA" in the last 8760 hours.  Assessment and Plan:  Janet Mitchell is a 56 yo female being evaluated for image-guided gastric tube placement with contrast following a hemorrhagic stroke on 08/22/21. IR  was consulted to perform this procedure; Dr Mir reviewed the imaging and Dr Anselm Pancoast approved the patient to have the procedure to be possibly performed on 09/16/21. Hospitalist care team plans for eventual discharge to [place].  Risks and benefits image guided gastrostomy tube placement was discussed with the patient's husband [name] Mitchell via phone including, but not limited to the need for a barium enema during the procedure, bleeding, infection, peritonitis and/or damage to adjacent structures.  All of the patient's questions were answered, patient's husband is agreeable to proceed and gave verbal consent.   Verbal consent signed by this provider and Jannifer Franklin PA-C as witnesses and it is in IR suite.   Thank you for this interesting consult.  I greatly enjoyed meeting Diasia Henken Mitchell and look forward to participating in their care.  A copy of this report was sent to the requesting provider on this date.  Electronically Signed: Lura Em, PA-C 09/15/2021, 1:15 PM   I spent a total of 20 Minutes    in face to face in clinical consultation, greater than 50% of which was counseling/coordinating care for image-guided gastric tube placement.

## 2021-09-15 NOTE — Progress Notes (Signed)
SLP Cancellation Note  Patient Details Name: Janet Mitchell MRN: [id number] DOB: 1965-07-22   Cancelled treatment:        Pt currently in HD. Will continue efforts.    [name] 09/15/2021, 11:38 AM

## 2021-09-16 ENCOUNTER — Inpatient Hospital Stay (HOSPITAL_COMMUNITY): Payer: 59

## 2021-09-16 DIAGNOSIS — I629 Nontraumatic intracranial hemorrhage, unspecified: Secondary | ICD-10-CM | POA: Diagnosis not present

## 2021-09-16 HISTORY — PX: IR GASTROSTOMY TUBE MOD SED: IMG625

## 2021-09-16 LAB — RENAL FUNCTION PANEL
Albumin: 2.9 g/dL — ABNORMAL LOW (ref 3.5–5.0)
Anion gap: 12 (ref 5–15)
BUN: 37 mg/dL — ABNORMAL HIGH (ref 6–20)
CO2: 30 mmol/L (ref 22–32)
Calcium: 9.5 mg/dL (ref 8.9–10.3)
Chloride: 90 mmol/L — ABNORMAL LOW (ref 98–111)
Creatinine, Ser: 4.36 mg/dL — ABNORMAL HIGH (ref 0.44–1.00)
GFR, Estimated: 12 mL/min — ABNORMAL LOW (ref >60)
Glucose, Bld: 155 mg/dL — ABNORMAL HIGH (ref 70–99)
Phosphorus: 5.1 mg/dL — ABNORMAL HIGH (ref 2.5–4.6)
Potassium: 3.8 mmol/L (ref 3.5–5.1)
Sodium: 132 mmol/L — ABNORMAL LOW (ref 135–145)

## 2021-09-16 LAB — GLUCOSE, CAPILLARY
Glucose-Capillary: 100 mg/dL — ABNORMAL HIGH (ref 70–99)
Glucose-Capillary: 116 mg/dL — ABNORMAL HIGH (ref 70–99)
Glucose-Capillary: 121 mg/dL — ABNORMAL HIGH (ref 70–99)
Glucose-Capillary: 141 mg/dL — ABNORMAL HIGH (ref 70–99)

## 2021-09-16 LAB — PROTIME-INR
INR: 1.1 (ref 0.8–1.2)
Prothrombin Time: 13.8 seconds (ref 11.4–15.2)

## 2021-09-16 IMAGING — DX DG ABD PORTABLE 1V
2 series · 2 of 2 positions shown · non-contrast
Comparison: Radiograph [DATE]

CLINICAL DATA: Encounter for dysphagia

EXAM:
PORTABLE ABDOMEN - 1 VIEW

[abdomen supine (1 of 2)]
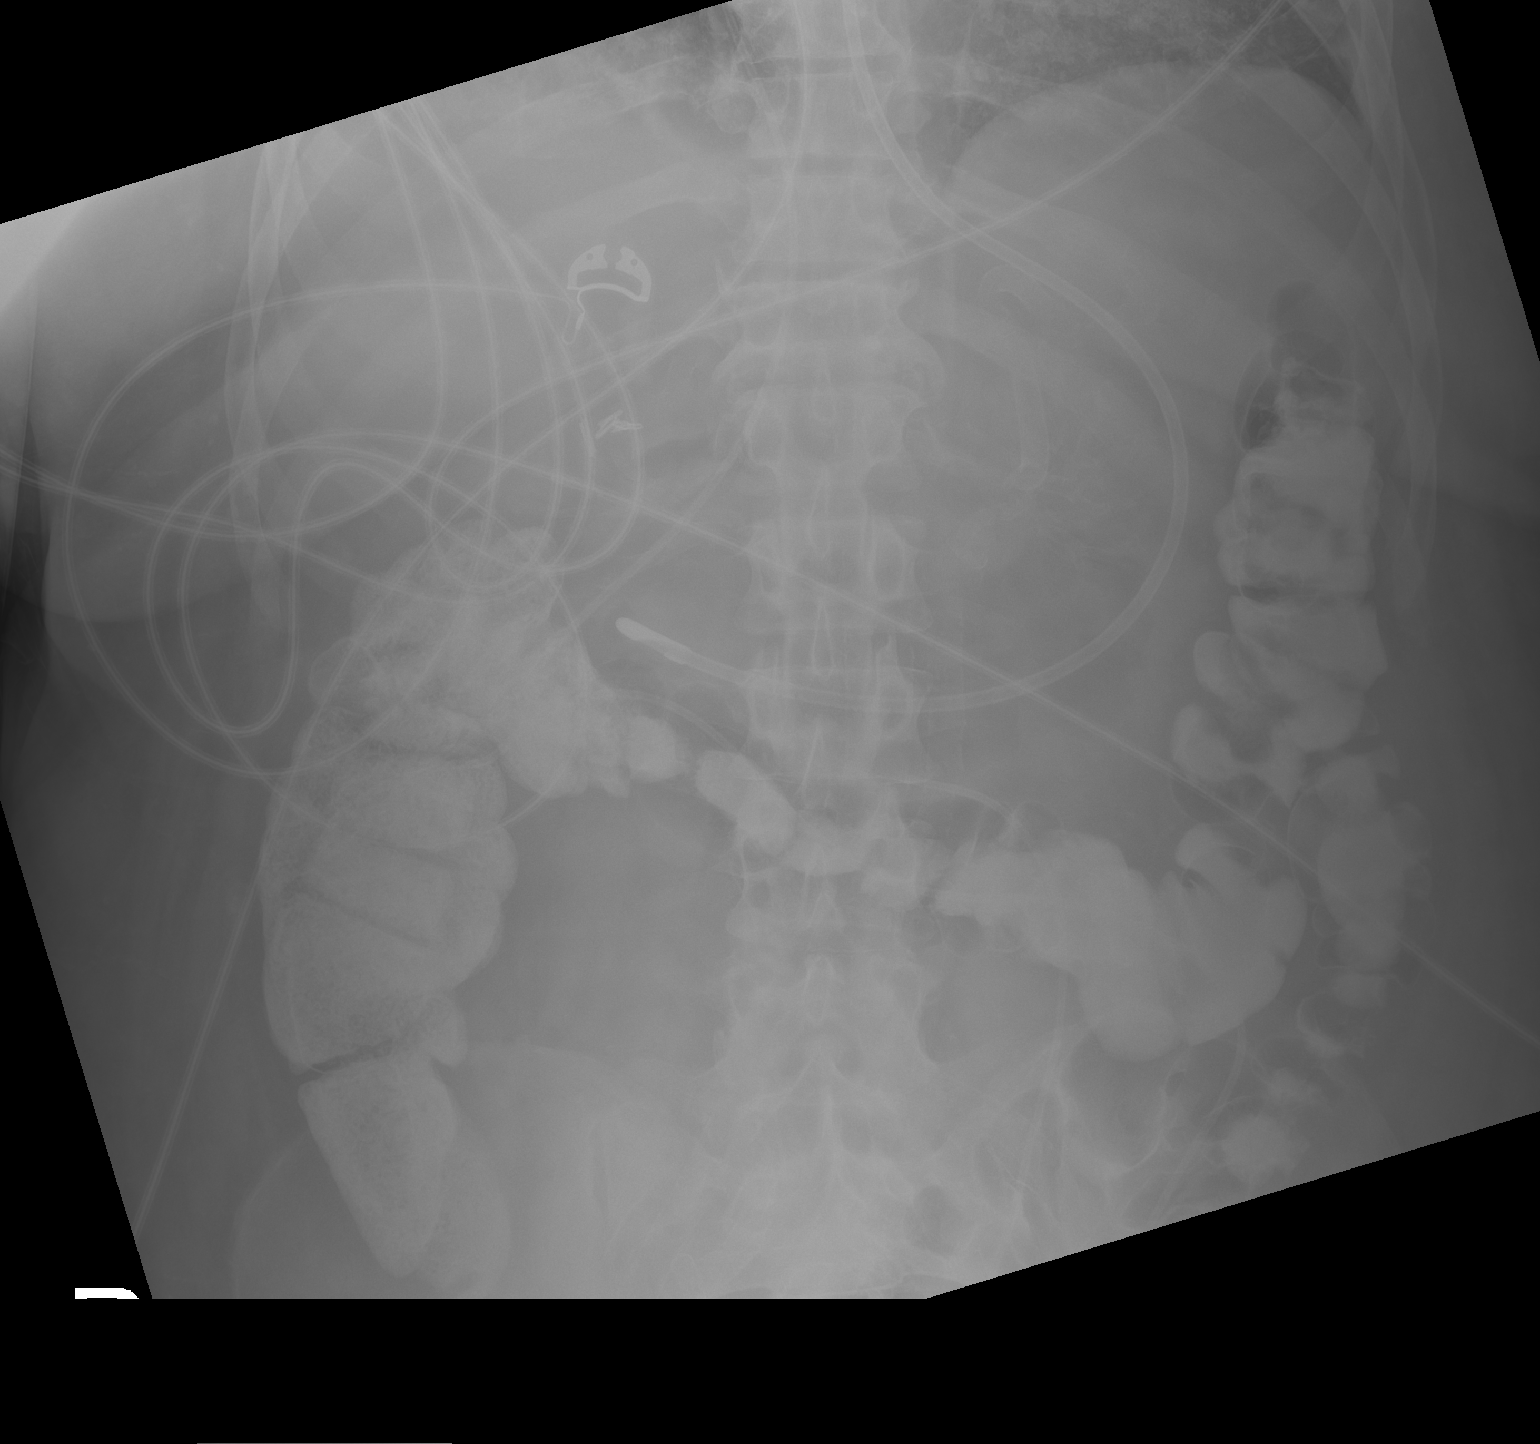

[abdomen supine (2 of 2)]
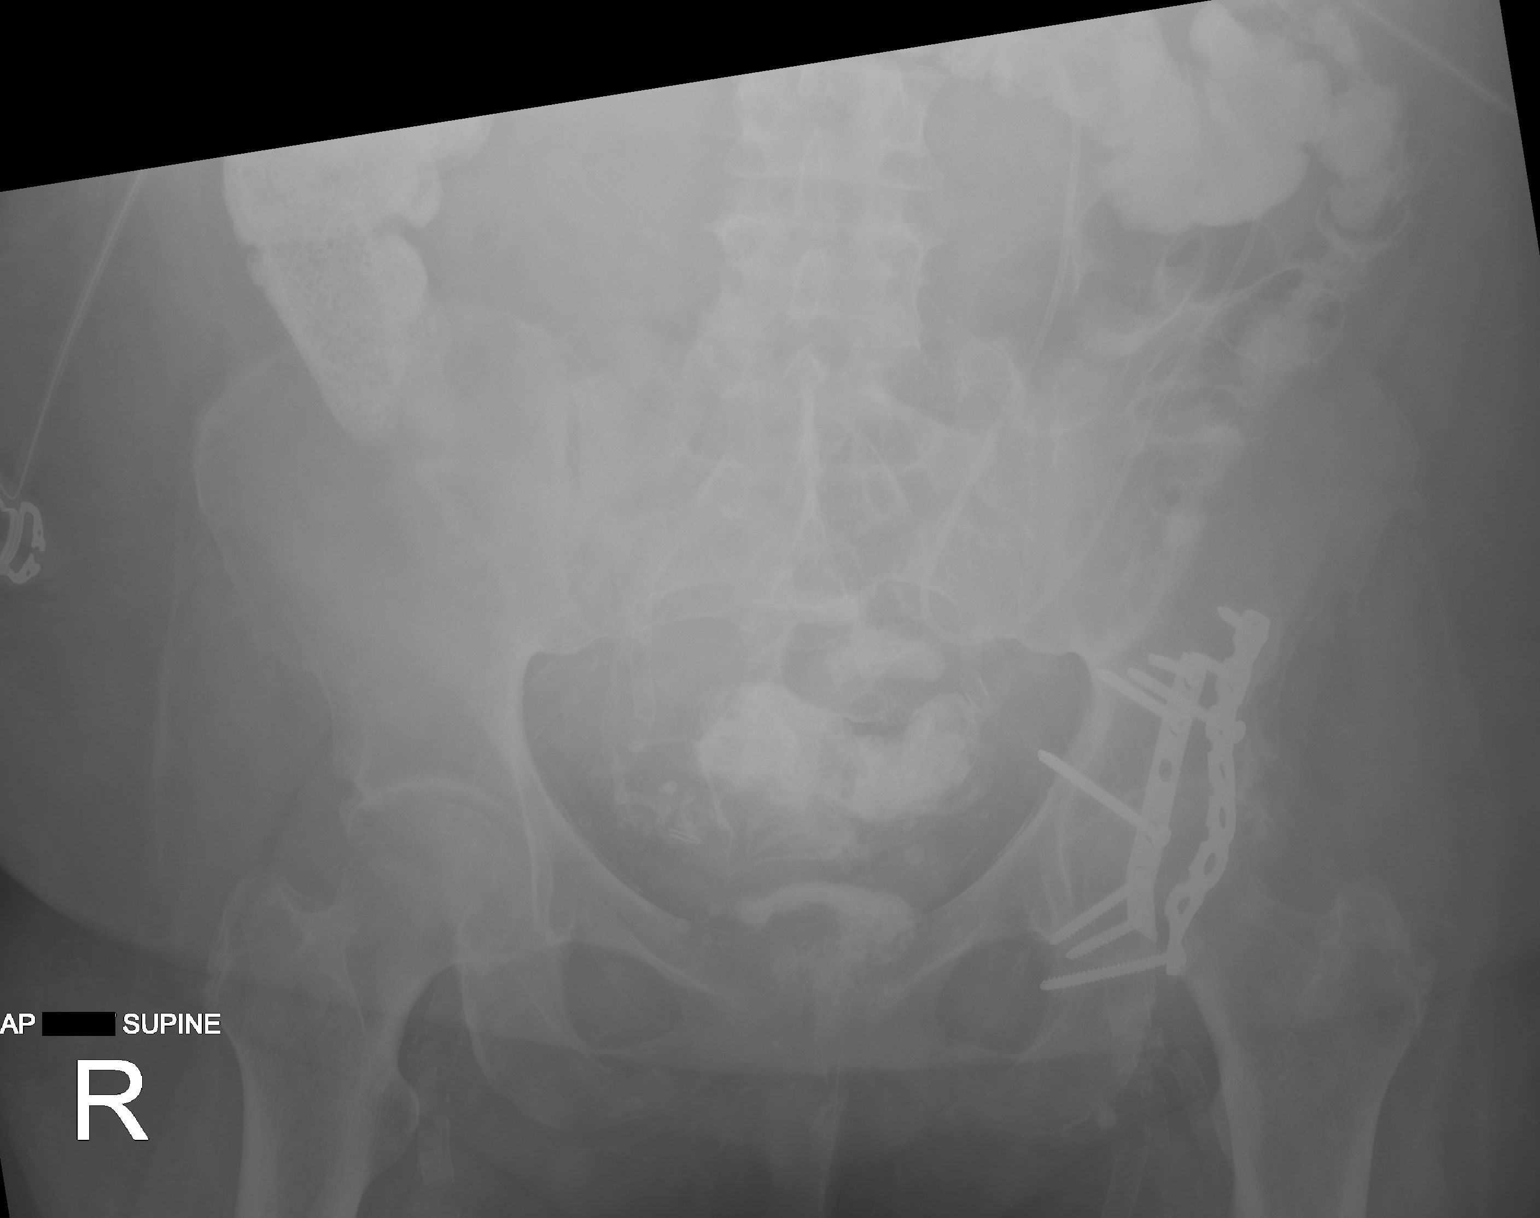

[2 of 2 positions shown; findings below may reference images not displayed]

FINDINGS: Feeding tube tip overlies the distal stomach in the region of the
pylorus. There is retained contrast material in the colon. There is
no evidence of bowel obstruction. Vascular calcifications. No acute
osseous abnormality. Bibasilar lung opacities, see recent chest
radiograph.
IMPRESSION: Feeding tube tip overlies the distal stomach in the region of the
pylorus.

No evidence of bowel obstruction. There is contrast material in the
colon.

Bibasilar lung opacities, partially visualized.

## 2021-09-16 IMAGING — XA IR PERC PLACEMENT GASTROSTOMY
4 series · 6 of 6 positions shown · non-contrast
Comparison: none

INDICATION: 55-year-old with stroke and needs supportive care.

[Series 1: fl (-) angio · 1 of 1 slices shown (1 of 4)]
[im 1/1]
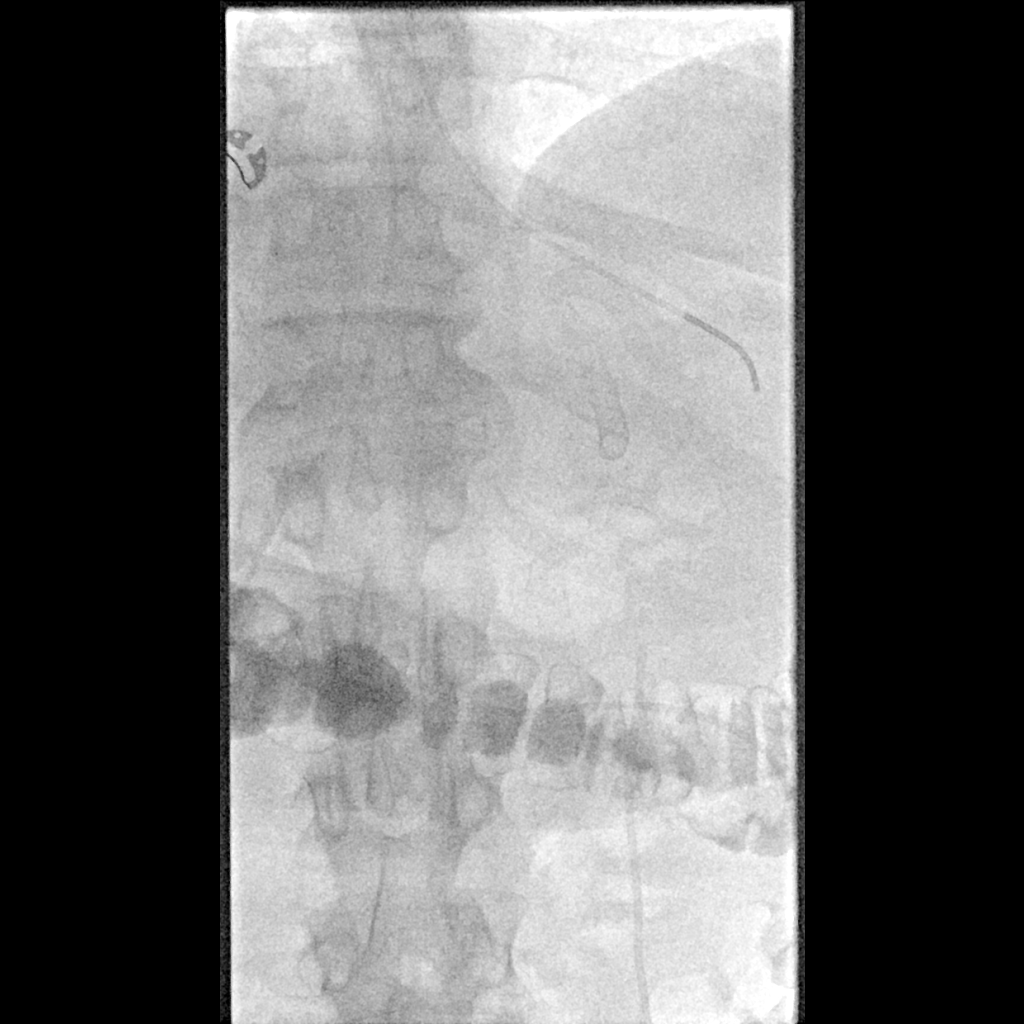

[Series 2: fl (-) angio · 1 of 1 slices shown (2 of 4)]
[im 1/1]
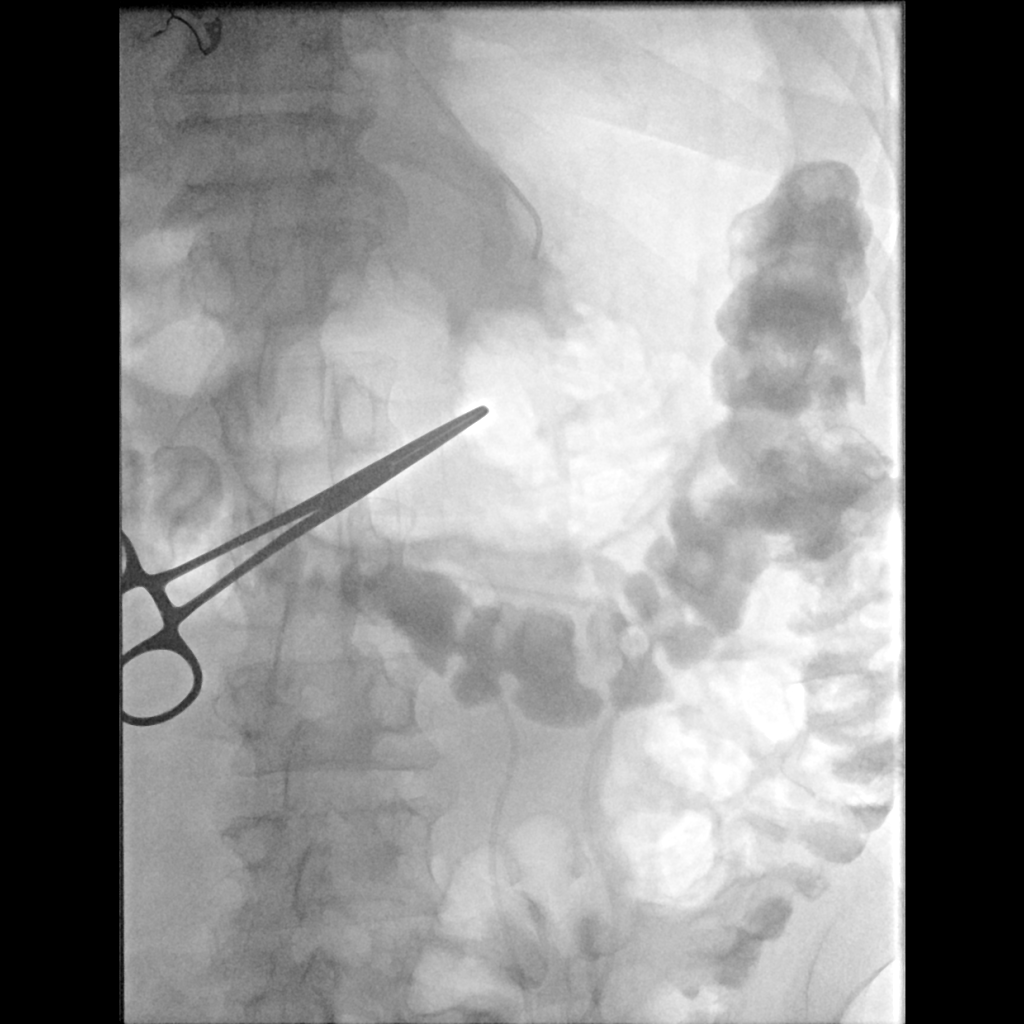

[Series 3: fl (-) angio · 3 of 24 frames shown (3 of 4)]
[frame 4/24]
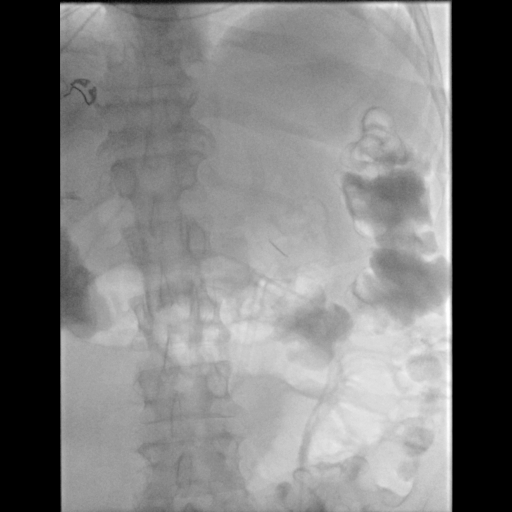
[frame 13/24]
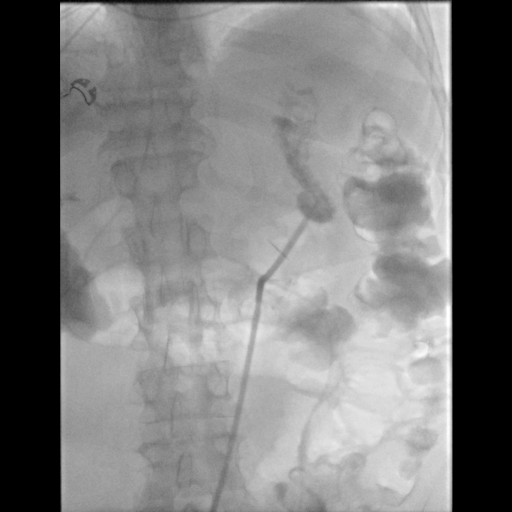
[frame 21/24]
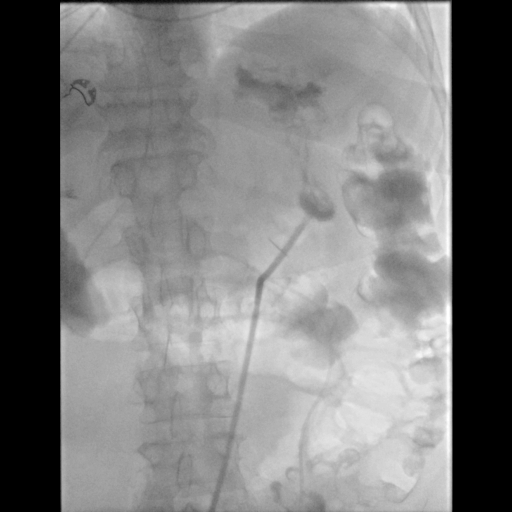

[Series 4: fl (-) angio · 1 of 1 slices shown (4 of 4)]
[im 1/1]
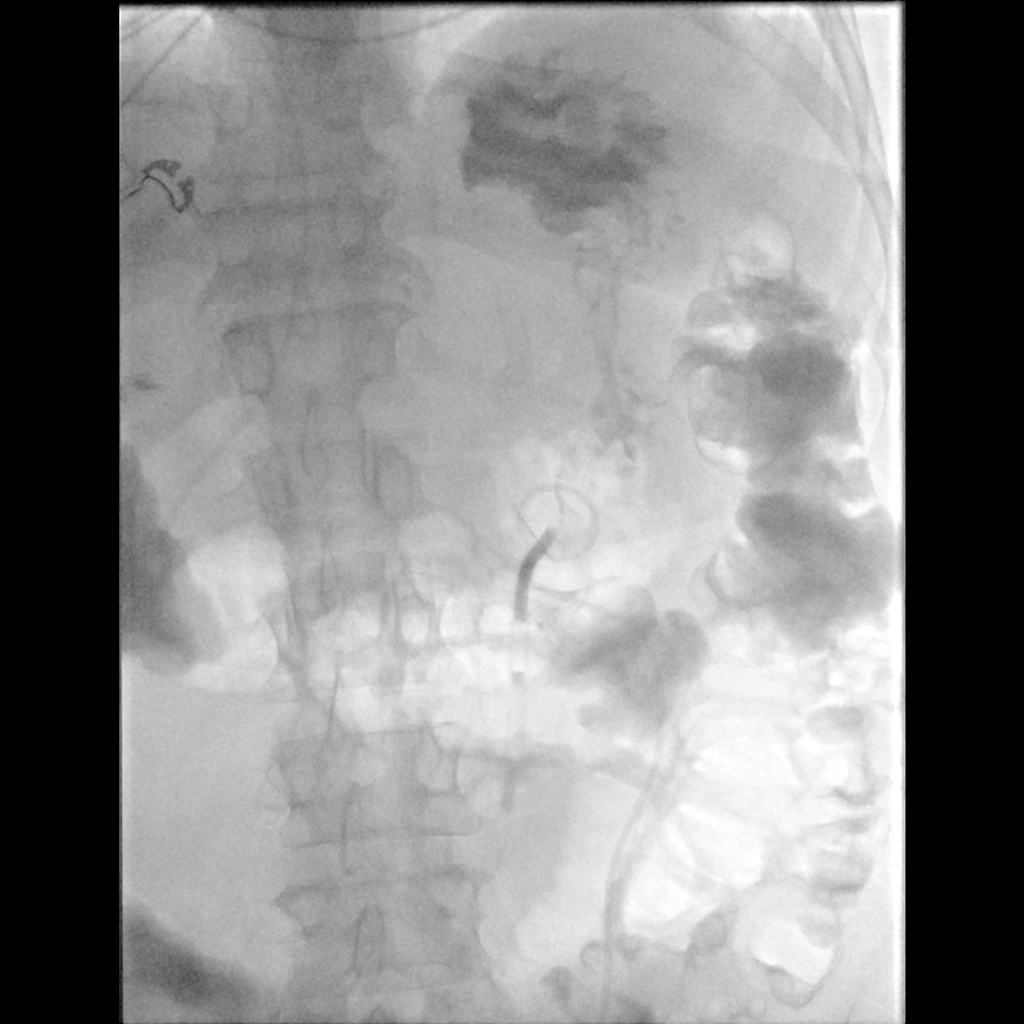

[6 of 6 positions shown; findings below may reference images not displayed]

EXAM:
PERCUTANEOUS GASTROSTOMY TUBE WITH FLUOROSCOPIC GUIDANCE

MEDICATIONS:
Ancef 2 g; Antibiotics were administered within 1 hour of the
procedure. Glucagon 1 mg

ANESTHESIA/SEDATION:
Moderate (conscious) sedation was employed during this procedure. A
total of Versed 1.0mg and fentanyl 25 mcg was administered
intravenously at the order of the provider performing the procedure.

Total intra-service moderate sedation time: 35 minutes.

Patient's level of consciousness and vital signs were monitored
continuously by radiology nurse throughout the procedure under the
supervision of the provider performing the procedure.

FLUOROSCOPY:
Radiation Exposure Index (as provided by the fluoroscopic device):
128 mGy Kerma

COMPLICATIONS:
None immediate.

PROCEDURE:
Informed consent was obtained for a percutaneous gastrostomy tube.
The patient was placed on the interventional table. Fluoroscopy
demonstrated oral contrast in the transverse colon. An orogastric
tube was placed with fluoroscopic guidance. The anterior abdomen was
prepped and draped in sterile fashion. Maximal barrier sterile
technique was utilized including caps, mask, sterile gowns, sterile
gloves, sterile drape, hand hygiene and skin antiseptic. Stomach was
inflated with air through the orogastric tube. The skin and
subcutaneous tissues were anesthetized with 1% lidocaine. A 17 gauge
needle was directed into the distended stomach with fluoroscopic
guidance. A wire was advanced into the stomach and NAKANI was
deployed. A 9-French vascular sheath was placed and the orogastric
tube was snared using a Gooseneck snare device. The orogastric tube
and snare were pulled out of the patient's mouth. The snare device
was connected to a 20-French gastrostomy tube. The snare device and
gastrostomy tube were pulled through the patient's mouth and out the
anterior abdominal wall. The gastrostomy tube was cut to an
appropriate length. Contrast injection through gastrostomy tube
confirmed placement within the stomach. Fluoroscopic images were
obtained for documentation. The gastrostomy tube was flushed with
normal saline.
FINDINGS: The abdomen was evaluated with ultrasound following the procedure.
The gastrostomy tube was noted to be in close proximity to the
prominent left hepatic lobe. It appeared that the gastrostomy tube
was not traversing the liver but a follow-up CT was performed in
order to confirm this finding.

Contrast injection confirmed placement in the stomach.
IMPRESSION: Successful fluoroscopic guided percutaneous gastrostomy tube
placement.

## 2021-09-16 IMAGING — CT CT ABDOMEN W/O CM
2 of 8 series · 15 of 46 positions shown, 17 images · non-contrast
Comparison: CT [facility], [DATE].  IR fluoroscopy, earlier same day.

CLINICAL DATA: Gastrostomy tube just placed. Tube was difficult to
pull through stomach and close proximity to the liver.

Confirm that gastrostomy tube does not transverse liver.
EXAM:
CT ABDOMEN WITHOUT CONTRAST
TECHNIQUE: Multidetector CT imaging of the abdomen was performed following the
standard protocol without IV contrast.
RADIATION DOSE REDUCTION: This exam was performed according to the
departmental dose-optimization program which includes automated
exposure control, adjustment of the mA and/or kV according to
patient size and/or use of iterative reconstruction technique.

[Series 3: abd 5.0 i30f 2 · axial · 0.98mm/px · z∈[+1165,+1440]mm · 12 of 67 slices shown, 14 images]
[im 6/67  soft-tissue]
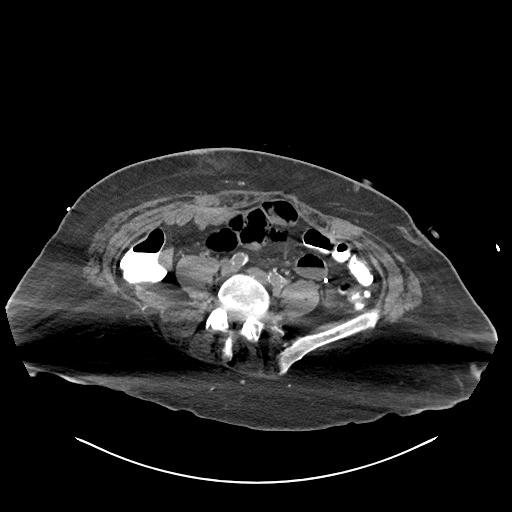
[im 6/67  bone]
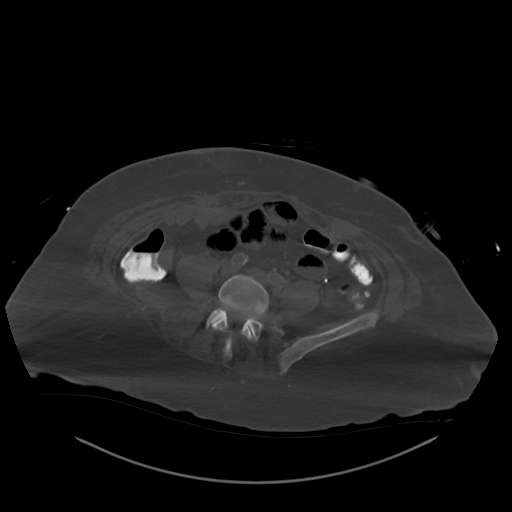
[im 11/67  soft-tissue]
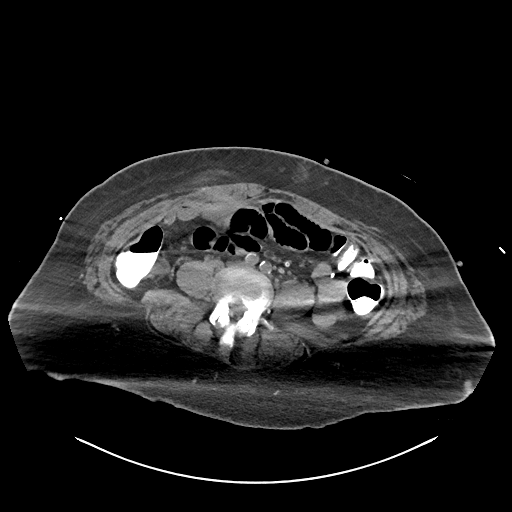
[im 16/67  soft-tissue]
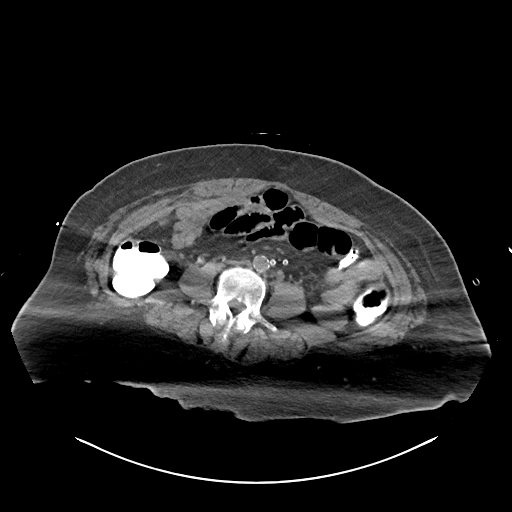
[im 21/67  soft-tissue]
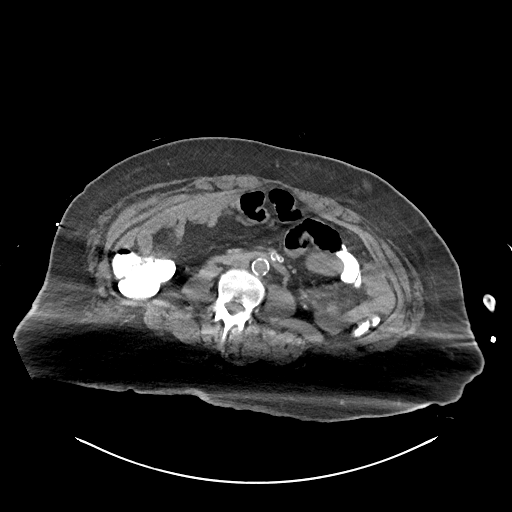
[im 26/67  soft-tissue]
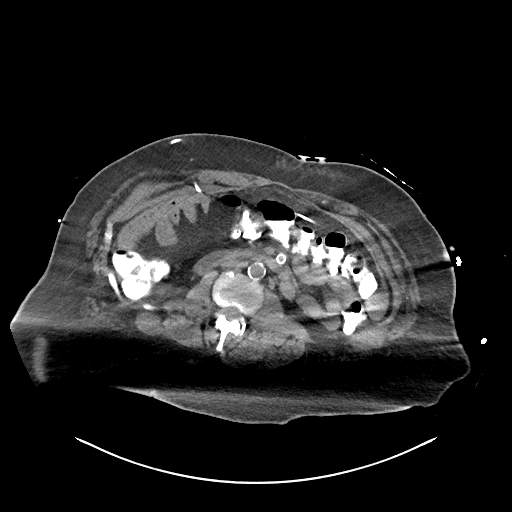
[im 31/67  soft-tissue]
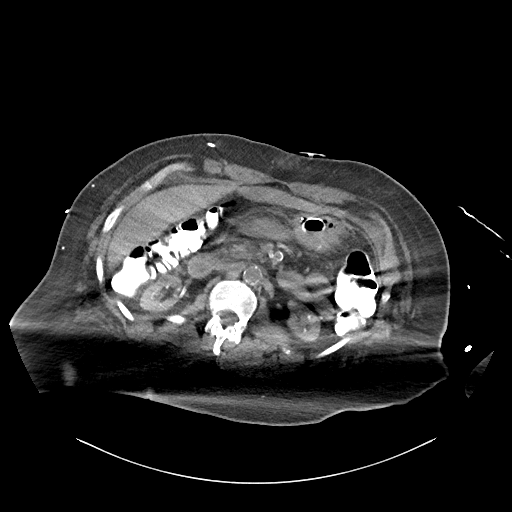
[im 36/67  soft-tissue]
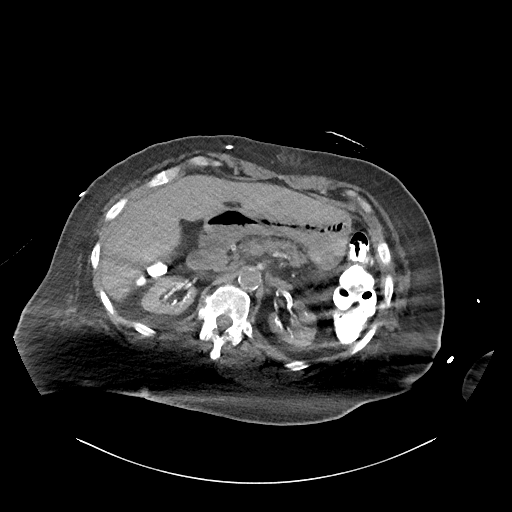
[im 41/67  soft-tissue]
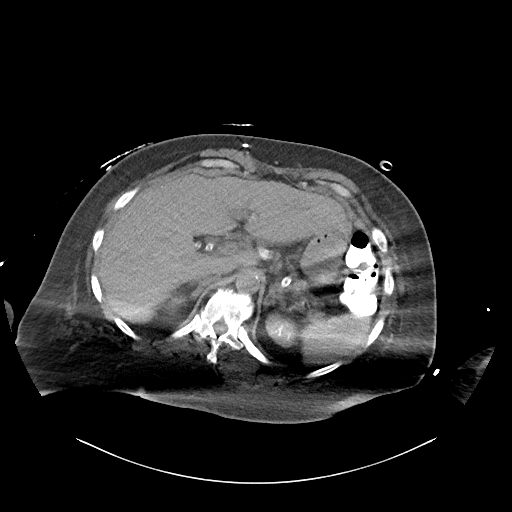
[im 46/67  soft-tissue]
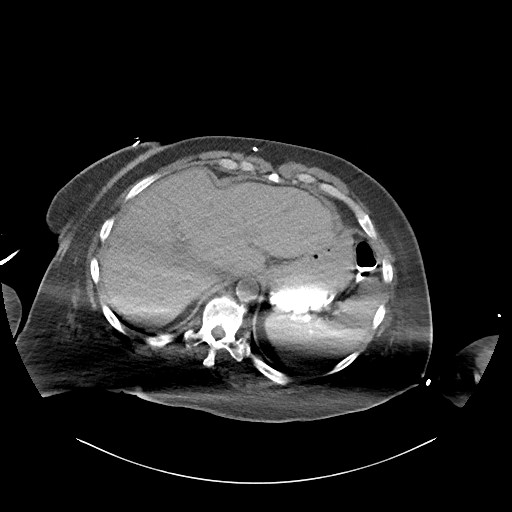
[im 46/67  bone]
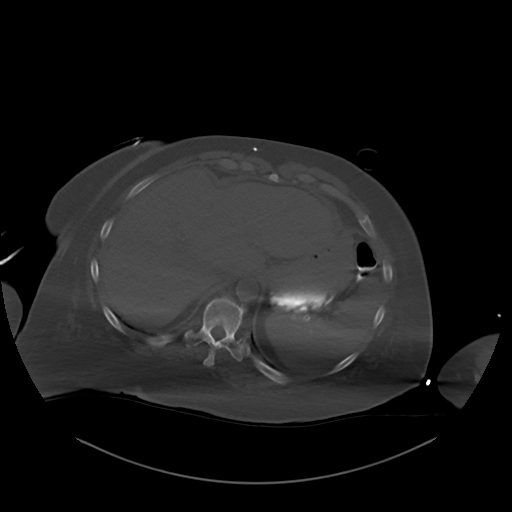
[im 51/67  soft-tissue]
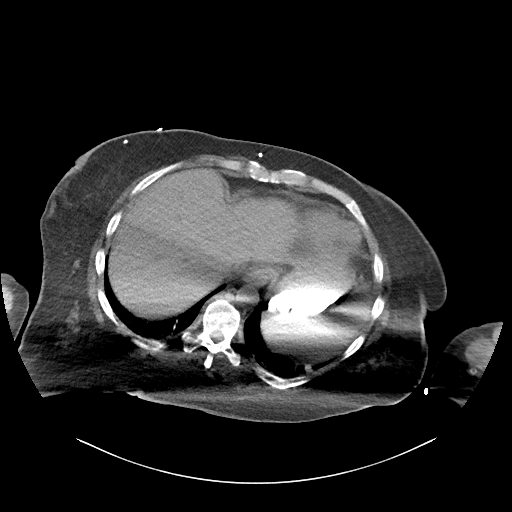
[im 56/67  soft-tissue]
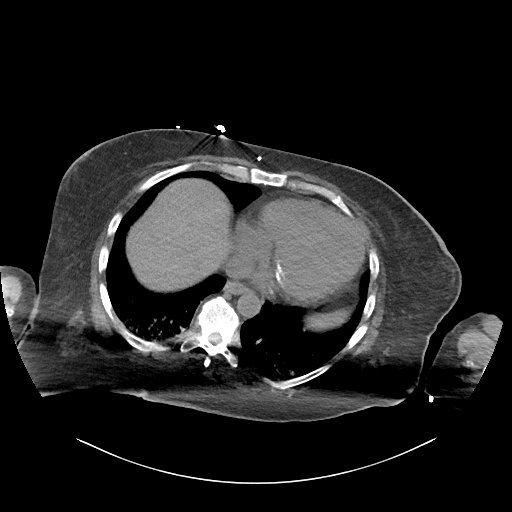
[im 61/67  soft-tissue]
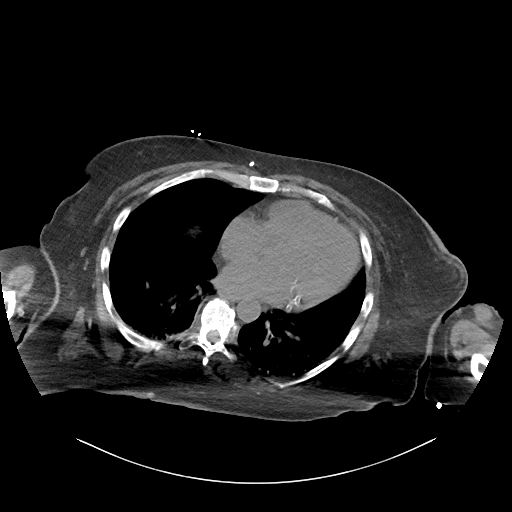

[Series 13: cor st · coronal · 0.69mm/px · 3 of 108 slices shown]
[im 27/108  soft-tissue]
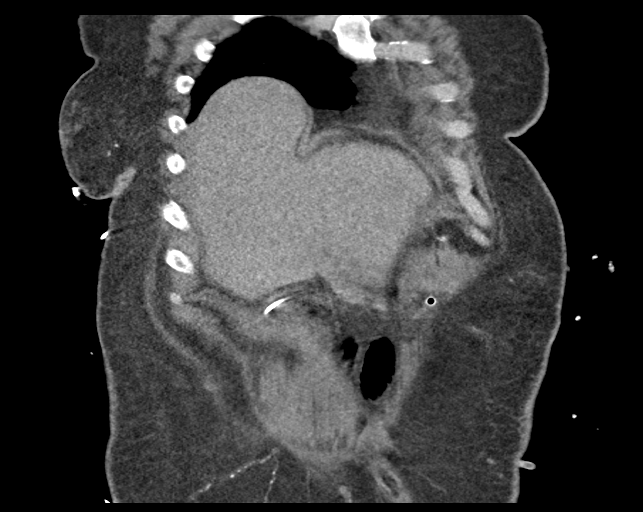
[im 54/108  soft-tissue]
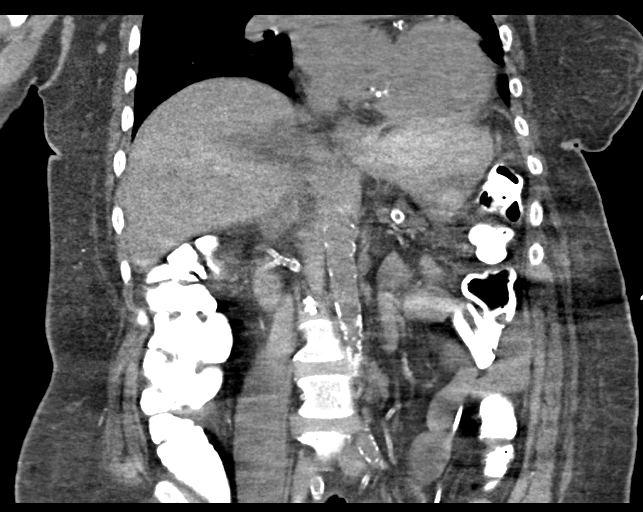
[im 81/108  soft-tissue]
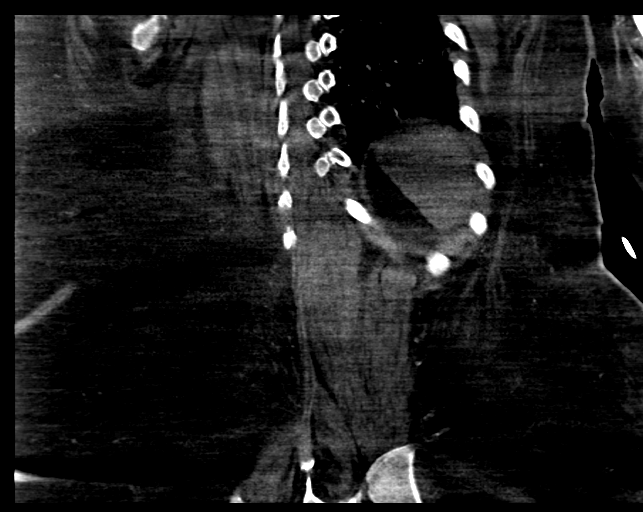

[15 of 46 positions shown; findings below may reference images not displayed]

FINDINGS: Lower chest: Innumerable bibasilar tree-in-bud nodularities,
greatest within the dependent lower lobes. A severe burden of
coronary atherosclerosis is present.

Hepatobiliary: Normal noncontrast appearance of the liver. Prominent
LEFT hepatic lobe. Status post cholecystectomy. No biliary
dilatation.

Pancreas: No pancreatic ductal dilatation or surrounding
inflammatory changes.

Spleen: Normal in size without focal abnormality.

Adrenals/Urinary Tract: Adrenal glands are unremarkable. Atrophic
kidneys consistent with medical renal disease. No renal calculi,
focal lesion, or hydronephrosis.

Stomach/Bowel: Imaged portions of bowel nonobstructed. Contrast
opacification of proximal stomach and imaged colon.

New percutaneous gastrostomy tube at the epigastrium. Course and
trajectory is inferior to the prominent LEFT hepatic lobe. Tube tip
is well-positioned at the gastric antrum. No intraperitoneal free
air. No focal drainable collection.

Vascular/Lymphatic: Aortic and severe extent of medium vessel
atherosclerosis. No enlarged abdominal lymph nodes.

Other: RIGHT VP shunt seen coursing along the anterior chest and
into the abdomen at the RIGHT upper quadrant. Catheter tip extends
outside the field of view into the LEFT pelvis. Rounded
partially-calcified RIGHT superior breast subcutaneous lesion. No
abdominal wall hernia. Multifocal anterior abdominal wall
contusions, likely injection sites.

Musculoskeletal: No acute osseous findings.
IMPRESSION: 1. Pulmonary findings consistent with multifocal pneumonia, and
likely aspiration in etiology.
2. New and well-positioned percutaneous gastrostomy tube with tip at
the gastric antrum as described above. No CT evidence of acute
complication.
3. Aortic Atherosclerosis ([WU]-[WU]) and severe medium vessel
atherosclerosis consistent with medical renal disease. Additional
incidental, chronic and senescent findings as above.

These results were conveyed by by telephone at the time of
interpretation on [DATE] at [DATE] to provider DERWICHE.

## 2021-09-16 MED ORDER — MIDAZOLAM HCL 2 MG/2ML IJ SOLN
INTRAMUSCULAR | Status: AC | PRN
  Administered 2021-09-16: 1 mg via INTRAVENOUS

## 2021-09-16 MED ORDER — GLUCAGON HCL RDNA (DIAGNOSTIC) 1 MG IJ SOLR
INTRAMUSCULAR | Status: AC
  Filled 2021-09-16: qty 1

## 2021-09-16 MED ORDER — FENTANYL CITRATE (PF) 100 MCG/2ML IJ SOLN
INTRAMUSCULAR | Status: AC | PRN
  Administered 2021-09-16: 25 ug via INTRAVENOUS

## 2021-09-16 MED ORDER — HYDRALAZINE HCL 25 MG PO TABS
25.0000 mg | ORAL_TABLET | Freq: Three times a day (TID) | ORAL | Status: DC
Start: 2021-09-16 — End: 2021-09-17

## 2021-09-16 MED ORDER — CHLORHEXIDINE GLUCONATE CLOTH 2 % EX PADS
6.0000 | MEDICATED_PAD | Freq: Every day | CUTANEOUS | Status: DC
  Administered 2021-09-18 – 2021-09-19 (×2): 6 via TOPICAL

## 2021-09-16 MED ORDER — MIDAZOLAM HCL 2 MG/2ML IJ SOLN
INTRAMUSCULAR | Status: AC
  Filled 2021-09-16: qty 2

## 2021-09-16 MED ORDER — LIDOCAINE HCL 1 % IJ SOLN
INTRAMUSCULAR | Status: AC
  Filled 2021-09-16: qty 20

## 2021-09-16 MED ORDER — FENTANYL CITRATE (PF) 100 MCG/2ML IJ SOLN
INTRAMUSCULAR | Status: AC
  Filled 2021-09-16: qty 2

## 2021-09-16 MED ORDER — GLUCAGON HCL (RDNA) 1 MG IJ SOLR
INTRAMUSCULAR | Status: AC | PRN
  Administered 2021-09-16: 1 mg via INTRAVENOUS

## 2021-09-16 MED ORDER — CEFAZOLIN SODIUM-DEXTROSE 2-4 GM/100ML-% IV SOLN
INTRAVENOUS | Status: AC
  Filled 2021-09-16: qty 100

## 2021-09-16 MED ORDER — CEFAZOLIN SODIUM-DEXTROSE 2-4 GM/100ML-% IV SOLN
INTRAVENOUS | Status: AC | PRN
  Administered 2021-09-16: 2 g via INTRAVENOUS

## 2021-09-16 NOTE — Procedures (Signed)
Interventional Radiology Procedure:   Indications: Post stroke  Procedure: Gastrostomy tube placement  Findings: 20 Fr gastrostomy tube placed.  Gastrostomy tube noted to be very close to left hepatic lobe on [place] after procedure.  CT of abdomen was performed and tube appears to be just below stomach and no evidence for bleeding around the liver.  Tube is well positioned in the stomach.   Complications: No immediate complications noted.     EBL: Minimal  Plan: Keep NPO tonight and anticipate starting tube feeds tomorrow on 09/17/21.   Ja Pistole R. Anselm Pancoast, MD  Pager: [phone]

## 2021-09-16 NOTE — Progress Notes (Signed)
PROGRESS NOTE                                                                                                                                                                                                             Patient Demographics:    Janet Mitchell, is a 56 y.o. female, DOB - 1965-07-31, FXT:[id number]  Outpatient Primary MD for the patient is Lindell Spar, MD    LOS - 24  Admit date - 08/23/2021    Chief Complaint  Patient presents with   Code Stroke       Brief Narrative (HPI from H&P)  56 y.o. female with history of ESRD on HD MWF, CHF, poorly controlled HTN, DM presenting with nausea, vomiting, left side weakness present upon waking.  Scented to [facility] on 08/23/2021 with nausea vomiting left-sided weakness, work-up showed right-sided thalamic hemorrhage she was intubated and transferred to [facility] under ICU care for further treatment.  She was seen by [facility], nephrology for HD, neurology and neurosurgery, external ventricular drain placement by neurosurgery on 08/23/2021.  She also was intubated, trached, during her hospital stay she had tracheostomy site infection for which she is on antibiotic.  She has been transferred to hospitalist service on 09/13/2021 on day 20 one of her hospital stay with plan of going to [place].   Events procedures  5/28 > Presents to [facility] ED, intubated. 5/29 > Transferred to [facility] Echocardiogram with EF 60%, no acute changes. 5/30 > HD, more awake, following commands with RUE/RLE 6/1 No issues overnight, she remains alert and interactive on vent.  Extubated 6/2 failed extubation requiring reintubation. Trached. 6/4 Placed on ATC yesterday afternoon and has tolerated well since 6/5: EVD raised to 20 cm H20 6/7: Failed EVD clamping trial after developing emesis and headache 6/8: Failed EVD clamping due to emesis 6/9: Increase EVD pressure to 25 6/12 back on vent  due to hypoxia from mucus plugging, started back on antibiotics 6/14 VP shunt placed. Antibiotics adjusted based on cultures 6/17-has been off the vent since 09/10/2021, did not require to be on overnight. 6/19.  Transferred to hospitalist service    Subjective:   In bed appears to be in no distress denies any headache chest or abdominal pain, answers with nods of her head.   Assessment  & Plan :  Right thalamic intracranial hemorrhage extending into right midbrain and intraventricular hemorrhage with mild obstructive hydrocephalus requiring external ventricular drain placement by neurosurgery on 08/23/2021 and subsequent VP shunt placement on 09/08/2021.  Hemorrhage was due to uncontrolled hypertension.  Brain bleed contributing to severe left-sided weakness, dysphagia along with toxic encephalopathy.  She has been seen by neurosurgery, neurology, she is currently trached with ongoing left-sided weakness, NG tube feeds, case discussed with neurologist Dr. Leonie Man on 09/13/2021.  Currently goal is supportive care, keep blood pressure systolic under 970, arrange for LTAC placement with most likely continued NG tube, have consulted IR and they will attempt PEG tube placement on 09/16/2021.  No antithrombotics.   2.  Inability to maintain airway with acute nonspecific respiratory failure due to #1 above requiring intubation, tracheostomy.  Now with trach collar with tracheostomy site infection.  Currently on antibiotics for treatment of Enterobacter and Staph aureus, antibiotic stop date 09/15/2021 per PCCM team.  Tracheostomy deferred to PCCM.  Requested RT to suction her trach site appropriately and keep it as clean as possible.  3.  Dysphagia and left-sided hemiparesis due to #1 above.  Supportive care, LTAC/CIR, currently NG tube feeds, IR attempting PEG tube on 09/16/2021.  Note she has a recent VP shunt placement.  4.  ESRD.  Nephrology following.  On MWF schedule.  5.  Poorly controlled blood  pressure.  Case discussed with neurology on 09/13/2021, goal SBP under 160, medications adjusted on 09/13/2021, ARB discontinued due to ESRD and hyperkalemia.  Blood pressure is extremely labile as well written parameters provided for holding.  6.  Anemia of chronic disease due to ESRD.  Supportive care.  7.  DM type II.  Lantus + Sliding scale.  Monitor.  Lab Results  Component Value Date   HGBA1C 6.2 (H) 08/23/2021   CBG (last 3)  Recent Labs    09/15/21 2353 09/16/21 0339 09/16/21 0820  GLUCAP 148* 141* 121*         Condition - Extremely Guarded  Family Communication  : Husband Joneen Boers [phone] on 09/13/2021 at 9:13 AM  Code Status : Full  Consults  : Neurology, neurosurgery, renal  PUD Prophylaxis : PPI       Disposition Plan  :    Status is: Inpatient  DVT Prophylaxis  :    heparin injection 5,000 Units Start: 09/17/21 0600 SCD's Start: 08/23/21 0925   Lab Results  Component Value Date   PLT 175 09/15/2021    Diet :  Diet Order             Diet NPO time specified Except for: Sips with Meds  Diet effective midnight                    Inpatient Medications  Scheduled Meds:  amantadine  200 mg Per Tube Weekly   amLODipine  10 mg Per Tube Daily   calcitRIOL  1 mcg Per Tube Q M,W,F   carvedilol  6.25 mg Per NG tube BID WC   chlorhexidine  15 mL Mouth Rinse BID   Chlorhexidine Gluconate Cloth  6 each Topical Q0600   darbepoetin (ARANESP) injection - DIALYSIS  100 mcg Intravenous Q Fri-HD   feeding supplement (PROSource TF)  45 mL Per Tube BID   fiber  1 packet Per Tube BID   [START ON 09/17/2021] heparin injection (subcutaneous)  5,000 Units Subcutaneous Q8H   hydrALAZINE  25 mg Per Tube Q8H   insulin aspart  0-9 Units Subcutaneous  Q4H   insulin glargine-yfgn  15 Units Subcutaneous BID   mouth rinse  15 mL Mouth Rinse q12n4p   multivitamin  1 tablet Per Tube QHS   pantoprazole sodium  40 mg Per Tube Daily   sevelamer carbonate  0.8 g Per  Tube TID WC   Continuous Infusions:  sodium chloride 0 mL/hr at 09/06/21 1141    ceFAZolin (ANCEF) IV     feeding supplement (NEPRO CARB STEADY) 50 mL/hr at 09/15/21 0432   PRN Meds:.acetaminophen **OR** acetaminophen (TYLENOL) oral liquid 160 mg/5 mL **OR** acetaminophen, diphenhydrAMINE, fentaNYL (SUBLIMAZE) injection, Gerhardt's butt cream, hydrALAZINE, ipratropium-albuterol, labetalol, ondansetron (ZOFRAN) IV, polyvinyl alcohol, senna-docusate  Time Spent in minutes  30   Lala Lund M.D on 09/16/2021 at 10:10 AM  To page go to [email or web address]   [facility] -  Office  [phone]  See all Orders from today for further details    Objective:   Vitals:   09/15/21 2331 09/16/21 0324 09/16/21 0700 09/16/21 0907  BP: 126/71 (!) 144/78 101/62   Pulse: 78 78 69 75  Resp: '20 18 15 '$ (!) 21  Temp:  98 F (36.7 C) 98.5 F (36.9 C)   TempSrc:   Axillary   SpO2: 92% 93% 96% 95%  Weight:      Height:        Wt Readings from Last 3 Encounters:  09/15/21 99.5 kg  06/03/21 99.1 kg  08/04/20 95.2 kg    No intake or output data in the 24 hours ending 09/16/21 1010    Physical Exam  Awake, nods head to basic questions, moves right-sided extremities, dense left-sided hemiparesis with some early contractures developing, NG tube in place, trach collar in place [place].  Right scalp staple site stable Supple Neck, No JVD,   Symmetrical Chest wall movement, Good air movement bilaterally, CTAB RRR,No Gallops, Rubs or new Murmurs,  +ve B.Sounds, Abd Soft, No tenderness,   No Cyanosis, Clubbing or edema     Data Review:    CBC Recent Labs  Lab 09/10/21 0547 09/11/21 0629 09/12/21 0223 09/13/21 0653 09/15/21 0151  WBC 8.6 9.0 10.1 10.0 9.0  HGB 8.8* 10.6* 9.3* 9.2* 9.4*  HCT 27.0* 33.4* 29.3* 28.4* 29.1*  PLT 146* 162 185 177 175  MCV 76.5* 78.6* 77.3* 77.0* 78.0*  MCH 24.9* 24.9* 24.5* 24.9* 25.2*  MCHC 32.6 31.7 31.7 32.4 32.3  RDW 16.2* 16.7* 16.5* 16.7* 17.2*   LYMPHSABS 1.8  --   --   --   --   MONOABS 0.8  --   --   --   --   EOSABS 0.4  --   --   --   --   BASOSABS 0.1  --   --   --   --     Electrolytes Recent Labs  Lab 09/12/21 0223 09/13/21 0653 09/14/21 0043 09/15/21 0151 09/16/21 0150  NA 134* 134* 135 134* 132*  K 5.4* 5.4* 4.3 4.5 3.8  CL 93* 91* 92* 91* 90*  CO2 '29 27 29 30 30  '$ GLUCOSE 133* 148* 150* 121* 155*  BUN 56* 86* 37* 65* 37*  CREATININE 5.26* 7.22* 4.15* 6.10* 4.36*  CALCIUM 9.3 9.7 9.6 9.9 9.5  ALBUMIN 2.7* 2.7* 2.7* 2.7* 2.9*  INR  --   --   --   --  1.1    ------------------------------------------------------------------------------------------------------------------ No results for input(s): "CHOL", "HDL", "LDLCALC", "TRIG", "CHOLHDL", "LDLDIRECT" in the last 72 hours.  Lab Results  Component Value Date  HGBA1C 6.2 (H) 08/23/2021     Radiology Reports DG Abd Portable 1V  Result Date: 09/16/2021 CLINICAL DATA:  Encounter for dysphagia EXAM: PORTABLE ABDOMEN - 1 VIEW COMPARISON:  Radiograph 09/02/2021 FINDINGS: Feeding tube tip overlies the distal stomach in the region of the pylorus. There is retained contrast material in the colon. There is no evidence of bowel obstruction. Vascular calcifications. No acute osseous abnormality. Bibasilar lung opacities, see recent chest radiograph. IMPRESSION: Feeding tube tip overlies the distal stomach in the region of the pylorus. No evidence of bowel obstruction. There is contrast material in the colon. Bibasilar lung opacities, partially visualized. Electronically Signed   By: Maurine Simmering M.D.   On: 09/16/2021 07:27   CT ABDOMEN PELVIS WO CONTRAST  Result Date: 09/14/2021 CLINICAL DATA:  eval for Perc g-tube placement EXAM: CT ABDOMEN AND PELVIS WITHOUT CONTRAST TECHNIQUE: Multidetector CT imaging of the abdomen and pelvis was performed following the standard protocol without IV contrast. RADIATION DOSE REDUCTION: This exam was performed according to the  departmental dose-optimization program which includes automated exposure control, adjustment of the mA and/or kV according to patient size and/or use of iterative reconstruction technique. COMPARISON:  July 26, 2019 FINDINGS: Lower chest: There are some interstitial changes seen. No consolidation, discrete pulmonary nodule or pleural effusion. Hepatobiliary: No focal liver abnormality is seen. Status post cholecystectomy. No biliary dilatation. Pancreas: Unremarkable. No pancreatic ductal dilatation or surrounding inflammatory changes. Spleen: Normal in size without focal abnormality. Adrenals/Urinary Tract: 8 mm lipomatous lesion in the left adrenal, stable. As before, kidneys are atrophied. 10 mm cyst in the right kidney, and is more conspicuous in the present study. Stomach/Bowel: There is an NG tube with its distal end at the distal gastric antrum/pyloric region. Stomach is located in the upper abdomen just inferior to the left lobe of the liver. Transverse colon is inferior to the stomach. There is some stool seen throughout the colon. Bowel-gas pattern is nonobstructive. Mild diverticulosis of the sigmoid colon. Vascular/Lymphatic: Extensive atheromatous calcifications of the abdominal aorta and its branches including the splenic artery, bilateral renal arteries, celiac and SMA as well as iliac arteries. Reproductive: Uterus and bilateral adnexa are unremarkable. There is an IUD in place. Severe atheromatous calcifications of the uterine arteries and their branches. Other: There has been interval removal of the Tenckhoff peritoneal dialysis catheter with interval resolution of the heterogeneous large fluid collection in the ventral aspect of the abdomen and pelvis. There has been interval placement of a VP shunt catheter. 5.5 cm x 2.6 cm soft tissue density at the right anterior pelvic wall, likely related to the previous Tenckhoff catheter placement. Musculoskeletal: Again seen are the postsurgical changes  at the left hip joint. IMPRESSION: Body of the stomach is inferior to the left lobe of the liver and the transverse colon is inferior to the body of the stomach. The stomach is amenable for percutaneous G-tube insertion. Atrophy of the bilateral kidneys. Extensive atheromatous calcifications of the abdominal aorta and its branches. Electronically Signed   By: Frazier Richards M.D.   On: 09/14/2021 15:05

## 2021-09-16 NOTE — Progress Notes (Signed)
Speech Language Pathology Treatment: Dysphagia;Passy Muir Speaking valve  Patient Details Name: Janet Mitchell MRN: [id number] DOB: 1965/09/20 Today's Date: 09/16/2021 Time: 8127-5170 SLP Time Calculation (min) (ACUTE ONLY): 18 min  Assessment / Plan / Recommendation Clinical Impression  PMSV Pt with cuff deflated on SLP arrival.  Pt asleep, but able to rouse and agreeable to treatment.   Secretions noted at trach hub.  SLP cleaned and cleared with suction and washcloth.  Speaking valve placed with no backpressure after 5 breaths.  No immediate coughing. Pt wore valve with SLP for 30 minutes with no change in vitals.  Pt was able to phonate and participated in speech-language assessment tasks with therapist (see evaluation note).  Vocal quality was intermittently wet and with low vocal intensity.  Pt benefited from verbal cues to speak loudly to improve communication.  Pt became sleepy at end of session and SLP removed valve prior to departure.  Pt may wear PMSV with 1:1 direct supervision with staff intermittently.  SWALLOWING Pt agreeable to PO trials today. SLP provided oral care prior to administration of ice chips and thin liquid by spoon.  Pt exhibited prompt oral response to ice chips. Pharyngeal swallow palpable.  There was mild anterior loss from L side of oral cavity intermittently and baseline oral spillage of secretions, which may be in part due to positioning with pt leaning to R side.  There was strong we cough on 1 of 2 trials of thin liquid by spoon, and on 1 of 5 trials with ice chips.  SLP assisted with clearance with use of suction.  Pt politely declined trials of puree.  With continued improvement in level of alertness and acceptance of bolus trials, pt may be ready for MBS early next week.   Recommend pt remain NPO with alternate means of nutrition, hydration, and medication. Pt may have ice chips in moderation, after good oral care, when fully awake/alert, with upright  positioning and supervision.    HPI HPI: Patient is a 56 y.o. female with PMH: ESRD on HD MWF, CHF, HTN, DM who presented to [facility] ER as a code stroke with c/o nausea and vomiting upon waking up in the morning along with left sided weakness. She was communicative at [facility] but during stay awaiting transfer to [facility] she required emergent intubation due to inability to protect airway. 6/1 CXR showed Diffuse bilateral lung opacities may be due to pulmonary edema or multifocal pneumonia. MRI brain showed right thalamic intraparenchymal hemorrhage  with surrounding edema. She was extubated on 6/1 and although initially tolerated well, quickly was seen with poor, ineffective cough, copious secretions unable to clear independently but in no obvious distress and oxygen saturations 100% on 4L nasal cannula. She was reintubated on 6/2 to tracheostomy #6 cuffed [place].      SLP Plan  Continue with current plan of care (Possible MBS early next week)      Recommendations for follow up therapy are one component of a multi-disciplinary discharge planning process, led by the attending physician.  Recommendations may be updated based on patient status, additional functional criteria and insurance authorization.    Recommendations  Diet recommendations: NPO Medication Administration: Via alternative means      Patient may use Passy-Muir Speech Valve: with SLP only;Intermittently with supervision PMSV Supervision: Full MD: Please consider changing trach tube to : Cuffless         Oral Care Recommendations: Oral care QID;Staff/trained caregiver to provide oral care Follow Up Recommendations: Acute inpatient rehab (3hours/day)  Assistance recommended at discharge: Frequent or constant Supervision/Assistance SLP Visit Diagnosis: Dysphagia, unspecified (R13.10);Aphonia (R49.1) Plan: Continue with current plan of care (Possible MBS early next week)           Celedonio Savage, [place], [place] Office: [phone] 09/16/2021, 11:26 AM

## 2021-09-16 NOTE — Progress Notes (Signed)
Pt currently not in room.

## 2021-09-16 NOTE — Evaluation (Signed)
Speech Language Pathology Evaluation Patient Details Name: Ikesha Siller Pinnix MRN: [id number] DOB: March 01, 1966 Today's Date: 09/16/2021 Time: 9242-6834 SLP Time Calculation (min) (ACUTE ONLY): 15 min  Problem List:  Patient Active Problem List   Diagnosis Date Noted   Acquired obstructive hydrocephalus ([place]) 08/24/2021   Acute respiratory failure ([place])    Intracranial hemorrhage ([place]) 08/23/2021   Proliferative diabetic retinopathy of right eye determined by examination ([place]) 06/29/2021   Stable treated proliferative diabetic retinopathy of left eye determined by examination associated with type 2 diabetes mellitus ([place]) 06/29/2021   URI (upper respiratory infection) 09/23/2020   ESRD (end stage renal disease) on dialysis ([place])    Class 2 severe obesity due to excess calories with serious comorbidity and body mass index (BMI) of 37.0 to 37.9 in adult [facility]) 02/14/2019   Abnormal nuclear stress test 11/02/2018   Rectal bleeding 08/27/2018   Diarrhea 05/22/2018   Microcytic anemia 03/25/2018   Chronic cholecystitis with calculus 03/09/2018   Type 2 diabetes mellitus with ESRD (end-stage renal disease) ([place]) 03/09/2018   Pre-transplant evaluation for ESRD (end stage renal disease) 12/19/2017   Dependence on renal dialysis ([place]) 11/06/2017   Diabetic retinopathy ([place]) 01/23/2016   End stage renal disease on dialysis due to type 2 diabetes mellitus ([place]) 01/23/2016   Hypertensive emergency 01/23/2016   Renal osteodystrophy 01/23/2016   Diabetic macular edema ([place]) 04/10/2015   Edema of lower extremity 04/10/2015   Paresthesia of lower extremity 04/10/2015   Recurrent falls 04/10/2015   Past Medical History:  Past Medical History:  Diagnosis Date   Abdominal wall hematoma 12/26/2019   Anemia    Arthritis    CHF (congestive heart failure) ([place])    pt states she has been cleared of heart failure/disease   Chronic cholecystitis with calculus    COVID-19 03/26/2019   COVID-19 virus  infection 03/2019   ESRD (end stage renal disease) on dialysis [facility])    Dialysis M-W-F   Headache    "a few/wk" (03/09/2018) - no longer having these   History of blood transfusion 10/2017   "low blood count" (03/09/2018)   Hypertension    Seasonal allergies    Spinal headache    Type II diabetes mellitus ([place])    Past Surgical History:  Past Surgical History:  Procedure Laterality Date   AMPUTATION TOE Left 2013   Great toe   AV FISTULA PLACEMENT Left 07/22/2019   Procedure: LEFT ARM BASILIC ARTERIOVENOUS (AV) FISTULA CREATION;  Surgeon: Rosetta Posner, MD;  Location: [place];  Service: Vascular;  Laterality: Left;   [place] Left 10/17/2019   Procedure: LEFT SECOND STAGE [place];  Surgeon: Rosetta Posner, MD;  Location: [place];  Service: Vascular;  Laterality: Left;   BIOPSY  10/24/2018   Procedure: BIOPSY;  Surgeon: Daneil Dolin, MD;  [facility];  Service: Endoscopy;;  right and left colon   CATARACT EXTRACTION W/ INTRAOCULAR LENS IMPLANT Right    [place]; [place] N/A 03/09/2018   Procedure: LAPAROSCOPIC CHOLECYSTECTOMY WITH INTRAOPERATIVE CHOLANGIOGRAM ERAS PATHWAY;  Surgeon: Donnie Mesa, MD;  Location: [place];  Service: General;  Laterality: N/A;   COLONOSCOPY N/A 10/24/2018   Procedure: COLONOSCOPY;  Surgeon: Daneil Dolin, MD;  [facility];  Service: Endoscopy;  Laterality: N/A;  1:00pm   EYE SURGERY Left 05/15/2018   Removal of blood in the globe (due to DM)   FLEXIBLE SIGMOIDOSCOPY N/A 11/24/2017   Procedure:  FLEXIBLE SIGMOIDOSCOPY;  Surgeon: Daneil Dolin, MD;  [facility];  Service: Endoscopy;  Laterality: N/A;   IR FLUORO GUIDE CV LINE RIGHT  07/17/2019   IR PARACENTESIS  07/09/2019   IR US GUIDE VASC ACCESS RIGHT  07/17/2019   LAPAROSCOPIC CHOLECYSTECTOMY  03/09/2018   LAPAROSCOPIC REVISION VENTRICULAR-PERITONEAL (V-P) SHUNT  09/08/2021   Procedure: LAPAROSCOPIC  ASSISTANCE FOR  VENTRICULAR-PERITONEAL (V-P) SHUNT PLACEMENT ;  Surgeon: Vallarie Mare, MD;  Location: [facility] OR;  Service: Neurosurgery;;   PERITONEAL CATHETER INSERTION  2017   POLYPECTOMY  10/24/2018   Procedure: POLYPECTOMY;  Surgeon: Daneil Dolin, MD;  [facility];  Service: Endoscopy;;   TOTAL HIP ARTHROPLASTY Left 1997   VENTRICULOPERITONEAL SHUNT N/A 09/08/2021   Procedure: SHUNT INSERTION VENTRICULAR-PERITONEAL;  Surgeon: Vallarie Mare, MD;  Location: [place];  Service: Neurosurgery;  Laterality: N/A;   HPI:  Patient is a 56 y.o. female with PMH: ESRD on HD MWF, CHF, HTN, DM who presented to [facility] ER as a code stroke with c/o nausea and vomiting upon waking up in the morning along with left sided weakness. She was communicative at [facility] but during stay awaiting transfer [facility] she required emergent intubation due to inability to protect airway. 6/1 CXR showed Diffuse bilateral lung opacities may be due to pulmonary edema or multifocal pneumonia. MRI brain showed right thalamic intraparenchymal hemorrhage  with surrounding edema. She was extubated on 6/1 and although initially tolerated well, quickly was seen with poor, ineffective cough, copious secretions unable to clear independently but in no obvious distress and oxygen saturations 100% on 4L nasal cannula. She was reintubated on 6/2 to tracheostomy #6 cuffed [place].   Assessment / Plan / Recommendation Clinical Impression  Pt presents with mild expressive and receptive aphasia. Pt was assessed informally with PMSV in place. Pt completed rote speech tasks (counting, DOW) with no erros.  Pt engaged in brief social exchange and was able to state her first name.  Pt completed confrontational naming task with 60% accuracy.  Pt benefited from semantic and phonemic cues.  Semantic paraphasa noted x1 ("glass" for "window").  Pt completed repetition task with 100% accuracy, with moderate dysarthria noted.  Pt answered yes/no questions with  80% accuracy with errors coming with complex comparison questions.  Pt answered personal and immediate environment questions with 1--% accuracy.  Pt followed 1 step motor commands with 2 of 3 accuracy, and 2 step commands with 50% accuracy, but was noted to become increasingly lethargic and had been working with SLP for half an hour on PMSV and swallowng as well (see progress note).  Pt followed directions during swallowing and speaking valve assessment requiring minimal to no cuing.    Moderate dysarthria noted.  Speech intelligibility is impacted by L sided facial deficits and present of tracheostomy.  Pt tolerating PMV placement well this session, but required intermittent cuing to speak loudly to attain sufficient phonation for communication.    SLP will follow to address the above noted deficits.  Pt would likely benefit from further evaluation of cognition when ability to sustain alertness improves, and pt is able to consistently communicate verbally. Pt will likely need ongoing ST at next level of care.    SLP Assessment  SLP Recommendation/Assessment: Patient needs continued Speech Lanaguage Pathology Services SLP Visit Diagnosis: Aphasia (R47.01);Dysarthria and anarthria (R47.1)    Recommendations for follow up therapy are one component of a multi-disciplinary discharge planning process, led by the attending physician.  Recommendations  may be updated based on patient status, additional functional criteria and insurance authorization.    Follow Up Recommendations  Acute inpatient rehab (3hours/day)    Assistance Recommended at Discharge  Frequent or constant Supervision/Assistance  Functional Status Assessment Patient has had a recent decline in their functional status and demonstrates the ability to make significant improvements in function in a reasonable and predictable amount of time.  Frequency and Duration min 3x week  2 weeks      SLP Evaluation Cognition  Overall Cognitive  Status:  (Not directly assessed)       Comprehension  Auditory Comprehension Overall Auditory Comprehension: Impaired Yes/No Questions: Impaired Basic Biographical Questions: 76-100% accurate Basic Immediate Environment Questions: 75-100% accurate Complex Questions: 50-74% accurate Commands: Impaired One Step Basic Commands: 75-100% accurate Two Step Basic Commands: 50-74% accurate Interfering Components: Attention Visual Recognition/Discrimination Discrimination: Not tested Reading Comprehension Reading Status: Not tested    Expression Expression Primary Mode of Expression: Verbal Verbal Expression Overall Verbal Expression: Impaired Automatic Speech: Counting;Day of week Level of Generative/Spontaneous Verbalization: Word Repetition: No impairment Naming: Impairment Confrontation: Impaired Verbal Errors: Phonemic paraphasias Interfering Components: Attention;Speech intelligibility   Oral / Motor  Oral Motor/Sensory Function Overall Oral Motor/Sensory Function: Mild impairment Facial ROM: Reduced left Facial Symmetry: Abnormal symmetry left Lingual ROM:  (seemingly reduced) Velum:  (could not test) Mandible: Within Functional Limits Motor Speech Overall Motor Speech: Impaired Respiration: Within functional limits Phonation: Low vocal intensity Resonance: Within functional limits Articulation: Impaired Level of Impairment: Word Intelligibility: Intelligibility reduced Word: 75-100% accurate Phrase: 50-74% accurate Motor Planning: Witnin functional limits            Celedonio Savage, MA, [place] Office: [phone] 09/16/2021, 11:51 AM

## 2021-09-17 DIAGNOSIS — I629 Nontraumatic intracranial hemorrhage, unspecified: Secondary | ICD-10-CM | POA: Diagnosis not present

## 2021-09-17 LAB — CBC WITH DIFFERENTIAL/PLATELET
Abs Immature Granulocytes: 0.04 10*3/uL (ref 0.00–0.07)
Basophils Absolute: 0.1 10*3/uL (ref 0.0–0.1)
Basophils Relative: 1 %
Eosinophils Absolute: 0.2 10*3/uL (ref 0.0–0.5)
Eosinophils Relative: 3 %
HCT: 30.2 % — ABNORMAL LOW (ref 36.0–46.0)
Hemoglobin: 9.7 g/dL — ABNORMAL LOW (ref 12.0–15.0)
Immature Granulocytes: 1 %
Lymphocytes Relative: 18 %
Lymphs Abs: 1.3 10*3/uL (ref 0.7–4.0)
MCH: 25.5 pg — ABNORMAL LOW (ref 26.0–34.0)
MCHC: 32.1 g/dL (ref 30.0–36.0)
MCV: 79.3 fL — ABNORMAL LOW (ref 80.0–100.0)
Monocytes Absolute: 0.9 10*3/uL (ref 0.1–1.0)
Monocytes Relative: 12 %
Neutro Abs: 4.8 10*3/uL (ref 1.7–7.7)
Neutrophils Relative %: 65 %
Platelets: 166 10*3/uL (ref 150–400)
RBC: 3.81 MIL/uL — ABNORMAL LOW (ref 3.87–5.11)
RDW: 17.5 % — ABNORMAL HIGH (ref 11.5–15.5)
WBC: 7.3 10*3/uL (ref 4.0–10.5)
nRBC: 0 % (ref 0.0–0.2)

## 2021-09-17 LAB — GLUCOSE, CAPILLARY
Glucose-Capillary: 134 mg/dL — ABNORMAL HIGH (ref 70–99)
Glucose-Capillary: 111 mg/dL — ABNORMAL HIGH (ref 70–99)
Glucose-Capillary: 100 mg/dL — ABNORMAL HIGH (ref 70–99)
Glucose-Capillary: 98 mg/dL (ref 70–99)
Glucose-Capillary: 139 mg/dL — ABNORMAL HIGH (ref 70–99)

## 2021-09-17 LAB — RENAL FUNCTION PANEL
Albumin: 2.9 g/dL — ABNORMAL LOW (ref 3.5–5.0)
Anion gap: 14 (ref 5–15)
BUN: 60 mg/dL — ABNORMAL HIGH (ref 6–20)
CO2: 28 mmol/L (ref 22–32)
Calcium: 9.9 mg/dL (ref 8.9–10.3)
Chloride: 91 mmol/L — ABNORMAL LOW (ref 98–111)
Creatinine, Ser: 6.1 mg/dL — ABNORMAL HIGH (ref 0.44–1.00)
GFR, Estimated: 8 mL/min — ABNORMAL LOW (ref >60)
Glucose, Bld: 96 mg/dL (ref 70–99)
Phosphorus: 7.7 mg/dL — ABNORMAL HIGH (ref 2.5–4.6)
Potassium: 4.2 mmol/L (ref 3.5–5.1)
Sodium: 133 mmol/L — ABNORMAL LOW (ref 135–145)

## 2021-09-17 MED ORDER — INSULIN GLARGINE-YFGN 100 UNIT/ML ~~LOC~~ SOLN: 15.0000 [IU] | Freq: Every day | SUBCUTANEOUS | Status: DC

## 2021-09-17 MED ORDER — SODIUM CHLORIDE 0.9 % IV SOLN
1.5000 g | Freq: Two times a day (BID) | INTRAVENOUS | Status: AC
  Administered 2021-09-17 – 2021-09-22 (×9): 1.5 g via INTRAVENOUS
  Filled 2021-09-17 (×10): qty 4

## 2021-09-17 MED ORDER — ALBUMIN HUMAN 25 % IV SOLN
INTRAVENOUS | Status: AC
  Administered 2021-09-17: 25 g via INTRAVENOUS
  Filled 2021-09-17: qty 100

## 2021-09-17 MED ORDER — NEPRO/CARBSTEADY PO LIQD
1000.0000 mL | ORAL | Status: DC
  Administered 2021-09-17: 1000 mL
  Filled 2021-09-17: qty 1000

## 2021-09-17 MED ORDER — ALBUMIN HUMAN 25 % IV SOLN
25.0000 g | Freq: Once | INTRAVENOUS | Status: AC
Start: 2021-09-17 — End: 2021-09-17

## 2021-09-17 MED ORDER — PROSOURCE TF PO LIQD
45.0000 mL | Freq: Four times a day (QID) | ORAL | Status: DC
Start: 2021-09-17 — End: 2021-09-21
  Administered 2021-09-17 – 2021-09-21 (×13): 45 mL
  Filled 2021-09-17 (×13): qty 45

## 2021-09-17 NOTE — Progress Notes (Signed)
Received patient in bed, alert and oriented. Informed consent signed and in chart.  Time tx initiated:0952  Pre HD weight:96.4kg  Pre HD VS:120/70  Time tx completed:1352  HD treatment completed. Patient tolerated well. Fistula/Graft/HD catheter without signs and symptoms of complications. Patient transported back to the room, alert and orient and in no acute distress. Report given to bedside RN.  Total UF removed:1500 ml  Medication given:Albumin 25g  Post HD VS:117/68  Post HD weight: 95kg

## 2021-09-17 NOTE — Progress Notes (Signed)
SLP Cancellation Note  Patient Details Name: Janet Mitchell MRN: [id number] DOB: 1965/04/24   Cancelled treatment:        Pt has been in HD since this am. Will continue efforts.    Royce Macadamia 09/17/2021, 2:16 PM

## 2021-09-18 ENCOUNTER — Inpatient Hospital Stay (HOSPITAL_COMMUNITY): Payer: 59

## 2021-09-18 DIAGNOSIS — I629 Nontraumatic intracranial hemorrhage, unspecified: Secondary | ICD-10-CM | POA: Diagnosis not present

## 2021-09-18 LAB — RENAL FUNCTION PANEL
Albumin: 3.2 g/dL — ABNORMAL LOW (ref 3.5–5.0)
Anion gap: 14 (ref 5–15)
BUN: 35 mg/dL — ABNORMAL HIGH (ref 6–20)
CO2: 29 mmol/L (ref 22–32)
Calcium: 9.5 mg/dL (ref 8.9–10.3)
Chloride: 91 mmol/L — ABNORMAL LOW (ref 98–111)
Creatinine, Ser: 4.38 mg/dL — ABNORMAL HIGH (ref 0.44–1.00)
GFR, Estimated: 11 mL/min — ABNORMAL LOW (ref >60)
Glucose, Bld: 145 mg/dL — ABNORMAL HIGH (ref 70–99)
Phosphorus: 5.2 mg/dL — ABNORMAL HIGH (ref 2.5–4.6)
Potassium: 3.7 mmol/L (ref 3.5–5.1)
Sodium: 134 mmol/L — ABNORMAL LOW (ref 135–145)

## 2021-09-18 LAB — CBC
HCT: 30.4 % — ABNORMAL LOW (ref 36.0–46.0)
Hemoglobin: 9.7 g/dL — ABNORMAL LOW (ref 12.0–15.0)
MCH: 25.3 pg — ABNORMAL LOW (ref 26.0–34.0)
MCHC: 31.9 g/dL (ref 30.0–36.0)
MCV: 79.4 fL — ABNORMAL LOW (ref 80.0–100.0)
Platelets: 154 10*3/uL (ref 150–400)
RBC: 3.83 MIL/uL — ABNORMAL LOW (ref 3.87–5.11)
RDW: 17.9 % — ABNORMAL HIGH (ref 11.5–15.5)
WBC: 6.1 10*3/uL (ref 4.0–10.5)
nRBC: 0 % (ref 0.0–0.2)

## 2021-09-18 LAB — GLUCOSE, CAPILLARY
Glucose-Capillary: 137 mg/dL — ABNORMAL HIGH (ref 70–99)
Glucose-Capillary: 145 mg/dL — ABNORMAL HIGH (ref 70–99)
Glucose-Capillary: 155 mg/dL — ABNORMAL HIGH (ref 70–99)
Glucose-Capillary: 156 mg/dL — ABNORMAL HIGH (ref 70–99)
Glucose-Capillary: 164 mg/dL — ABNORMAL HIGH (ref 70–99)
Glucose-Capillary: 180 mg/dL — ABNORMAL HIGH (ref 70–99)
Glucose-Capillary: 182 mg/dL — ABNORMAL HIGH (ref 70–99)

## 2021-09-18 LAB — C-REACTIVE PROTEIN: CRP: 3.3 mg/dL — ABNORMAL HIGH (ref <1.0)

## 2021-09-18 MED ORDER — NEPRO/CARBSTEADY PO LIQD
1000.0000 mL | ORAL | Status: DC
  Administered 2021-09-18 – 2021-09-21 (×3): 1000 mL
  Filled 2021-09-18 (×5): qty 1000

## 2021-09-18 NOTE — Progress Notes (Signed)
[facility] Progress Note  Subjective:  Last HD on 6/23 with 1.5 kg UF.  She does not provide additional history.    Review of systems:      Unable to obtain secondary to trach but more responsive again today  Denies air hunger Shakes her head that she's not cold   Vitals:   09/18/21 0302 09/18/21 0320 09/18/21 0700 09/18/21 0823  BP:  132/74 136/77 129/82  Pulse:  74 80 80  Resp:  19 18 (!) 21  Temp:   98 F (36.7 C)   TempSrc:   Axillary   SpO2:  100% 96% 96%  Weight: 99.3 kg     Height:        Physical Exam:        Adult female in bed in NAD Trach in place on oxygen via wall to trach 5 liters at 28% Chest cta bilaterally; unlabored S1S2 no rub Abd soft ntnd  Ext no LE edema appreciated Neuro - awake on arrival, smiles.nods/shakes head to questions.  Interactive Psych no anxiety or agitation  Access: LUE AVF bruit and thrill   Home meds include - asa, tylenol, renavite, cadexomer iodine, phoslo, ceterizine, cholestyramine, hydralazine 10 bid, labetalol 200 bid, losartan 100, psyllium     OP HD: [facility] MWF  4h  97.5kg  450/500  2/2.5 bath  15ga  AVF  Hep none - mircera 100 ug q2 - rocaltrol 1.0 mcg po tiw   Assessment/ Plan Right thalamic intracranial hemorrhage - w/ intraventricular hemorrhage and hydrocephalus. SP ventriculostomy drain placement, then VP shunt surgery was done on 6/14.  Per neurosurgery.  ESRD - HD per MWF schedule.  Hyperkalemia resolved with transition to nepro and off of losartan HTN -  Optimize volume with HD; acceptable Anemia CKD - got aranesp on 6/17 at 100 mcg off schedule.  Is ordered for 100 mcg weekly on Fridays.  May need to increase next dose.  CKD-MBD - On calcitriol. Have transitioned to renvela as hypercalcemia per corrected calcium.  Improved. Only 2.5 calcium dialysate available here.  On nepro.  Acute hypoxic resp failure/ s/p trach.  Per primary team  Atrial fib - per primary team   Disposition - she  has a trach in place. Team looking for LTAC - I agree.  She is not able to be placed at an outpatient HD unit due to the trach   --------------------------------------------------------- Inpatient medications:  amantadine  200 mg Per Tube Weekly   calcitRIOL  1 mcg Per Tube Q M,W,F   chlorhexidine  15 mL Mouth Rinse BID   Chlorhexidine Gluconate Cloth  6 each Topical Q0600   Chlorhexidine Gluconate Cloth  6 each Topical Q0600   darbepoetin (ARANESP) injection - DIALYSIS  100 mcg Intravenous Q Wed-HD   feeding supplement (PROSource TF)  45 mL Per Tube TID   fiber  1 packet Per Tube BID   heparin injection (subcutaneous)  5,000 Units Subcutaneous Q8H   insulin aspart  0-9 Units Subcutaneous Q4H   insulin aspart  6 Units Subcutaneous Q4H   insulin glargine-yfgn  15 Units Subcutaneous Daily   mouth rinse  15 mL Mouth Rinse q12n4p   multivitamin  1 tablet Per Tube QHS   pantoprazole sodium  40 mg Per Tube Daily    sodium chloride 0 mL/hr at 09/06/21 1141   ceFEPime (MAXIPIME) IV     feeding supplement (OSMOLITE 1.5 CAL) 50 mL/hr at 09/09/21 0800   acetaminophen **OR** acetaminophen (TYLENOL) oral  liquid 160 mg/5 mL **OR** acetaminophen, diphenhydrAMINE, fentaNYL (SUBLIMAZE) injection, Gerhardt's butt cream, ipratropium-albuterol, labetalol, ondansetron (ZOFRAN) IV, polyvinyl alcohol, senna-docusate     Recent Labs  Lab 09/17/21 0028 09/18/21 0205 09/18/21 0538  HGB 9.7*  --  9.7*  ALBUMIN 2.9* 3.2*  --   CALCIUM 9.9 9.5  --   PHOS 7.7* 5.2*  --   CREATININE 6.10* 4.38*  --   K 4.2 3.7  --    Inpatient medications:  amantadine  200 mg Per Tube Weekly   amLODipine  10 mg Per Tube Daily   calcitRIOL  1 mcg Per Tube Q M,W,F   carvedilol  6.25 mg Per NG tube BID WC   chlorhexidine  15 mL Mouth Rinse BID   Chlorhexidine Gluconate Cloth  6 each Topical Q0600   darbepoetin (ARANESP) injection - DIALYSIS  100 mcg Intravenous Q Fri-HD   feeding supplement (PROSource TF)  45 mL Per  Tube QID   fiber  1 packet Per Tube BID   heparin injection (subcutaneous)  5,000 Units Subcutaneous Q8H   insulin aspart  0-9 Units Subcutaneous Q4H   mouth rinse  15 mL Mouth Rinse q12n4p   multivitamin  1 tablet Per Tube QHS   pantoprazole sodium  40 mg Per Tube Daily   sevelamer carbonate  0.8 g Per Tube TID WC    sodium chloride 0 mL/hr at 09/06/21 1141   ampicillin-sulbactam (UNASYN) IV Stopped (09/18/21 0331)   feeding supplement (NEPRO CARB STEADY) 45 mL/hr at 09/18/21 1046   acetaminophen **OR** acetaminophen (TYLENOL) oral liquid 160 mg/5 mL **OR** acetaminophen, fentaNYL (SUBLIMAZE) injection, Gerhardt's butt cream, hydrALAZINE, ipratropium-albuterol, labetalol, ondansetron (ZOFRAN) IV, polyvinyl alcohol, senna-docusate   Estanislado Emms, MD 09/18/2021 11:02 AM

## 2021-09-18 NOTE — Plan of Care (Signed)
  Problem: Education: Goal: Knowledge of disease or condition will improve Outcome: Progressing   Problem: Coping: Goal: Will verbalize positive feelings about self Outcome: Progressing   Patient awake and alert. No acute distress noted. Patient responsive to care; attempts to nod yes/no when asked certain simple questions. Will continue to monitor according to plan of care and orders.

## 2021-09-19 DIAGNOSIS — I629 Nontraumatic intracranial hemorrhage, unspecified: Secondary | ICD-10-CM | POA: Diagnosis not present

## 2021-09-19 LAB — CBC WITH DIFFERENTIAL/PLATELET
Abs Immature Granulocytes: 0.03 10*3/uL (ref 0.00–0.07)
Basophils Absolute: 0.1 10*3/uL (ref 0.0–0.1)
Basophils Relative: 1 %
Eosinophils Absolute: 0.3 10*3/uL (ref 0.0–0.5)
Eosinophils Relative: 4 %
HCT: 31.1 % — ABNORMAL LOW (ref 36.0–46.0)
Hemoglobin: 9.7 g/dL — ABNORMAL LOW (ref 12.0–15.0)
Immature Granulocytes: 1 %
Lymphocytes Relative: 20 %
Lymphs Abs: 1.2 10*3/uL (ref 0.7–4.0)
MCH: 24.7 pg — ABNORMAL LOW (ref 26.0–34.0)
MCHC: 31.2 g/dL (ref 30.0–36.0)
MCV: 79.3 fL — ABNORMAL LOW (ref 80.0–100.0)
Monocytes Absolute: 1 10*3/uL (ref 0.1–1.0)
Monocytes Relative: 16 %
Neutro Abs: 3.6 10*3/uL (ref 1.7–7.7)
Neutrophils Relative %: 58 %
Platelets: 151 10*3/uL (ref 150–400)
RBC: 3.92 MIL/uL (ref 3.87–5.11)
RDW: 17.8 % — ABNORMAL HIGH (ref 11.5–15.5)
WBC: 6.1 10*3/uL (ref 4.0–10.5)
nRBC: 0 % (ref 0.0–0.2)

## 2021-09-19 LAB — GLUCOSE, CAPILLARY
Glucose-Capillary: 113 mg/dL — ABNORMAL HIGH (ref 70–99)
Glucose-Capillary: 192 mg/dL — ABNORMAL HIGH (ref 70–99)
Glucose-Capillary: 173 mg/dL — ABNORMAL HIGH (ref 70–99)
Glucose-Capillary: 157 mg/dL — ABNORMAL HIGH (ref 70–99)
Glucose-Capillary: 168 mg/dL — ABNORMAL HIGH (ref 70–99)
Glucose-Capillary: 159 mg/dL — ABNORMAL HIGH (ref 70–99)

## 2021-09-19 LAB — C-REACTIVE PROTEIN: CRP: 3 mg/dL — ABNORMAL HIGH (ref <1.0)

## 2021-09-19 LAB — RENAL FUNCTION PANEL
Albumin: 3.1 g/dL — ABNORMAL LOW (ref 3.5–5.0)
Anion gap: 14 (ref 5–15)
BUN: 66 mg/dL — ABNORMAL HIGH (ref 6–20)
CO2: 28 mmol/L (ref 22–32)
Calcium: 9.7 mg/dL (ref 8.9–10.3)
Chloride: 91 mmol/L — ABNORMAL LOW (ref 98–111)
Creatinine, Ser: 6.59 mg/dL — ABNORMAL HIGH (ref 0.44–1.00)
GFR, Estimated: 7 mL/min — ABNORMAL LOW (ref >60)
Glucose, Bld: 130 mg/dL — ABNORMAL HIGH (ref 70–99)
Phosphorus: 6.5 mg/dL — ABNORMAL HIGH (ref 2.5–4.6)
Potassium: 3.8 mmol/L (ref 3.5–5.1)
Sodium: 133 mmol/L — ABNORMAL LOW (ref 135–145)

## 2021-09-19 MED ORDER — CHLORHEXIDINE GLUCONATE CLOTH 2 % EX PADS
6.0000 | MEDICATED_PAD | Freq: Every day | CUTANEOUS | Status: DC
  Administered 2021-09-20 – 2021-09-21 (×2): 6 via TOPICAL

## 2021-09-19 MED ORDER — ORAL CARE MOUTH RINSE: 15.0000 mL | OROMUCOSAL | Status: DC | PRN

## 2021-09-19 MED ORDER — ORAL CARE MOUTH RINSE
15.0000 mL | OROMUCOSAL | Status: DC
  Administered 2021-09-19 – 2021-10-05 (×48): 15 mL via OROMUCOSAL

## 2021-09-20 ENCOUNTER — Inpatient Hospital Stay (HOSPITAL_COMMUNITY): Payer: 59

## 2021-09-20 DIAGNOSIS — I629 Nontraumatic intracranial hemorrhage, unspecified: Secondary | ICD-10-CM | POA: Diagnosis not present

## 2021-09-20 LAB — CBC WITH DIFFERENTIAL/PLATELET
Abs Immature Granulocytes: 0.02 10*3/uL (ref 0.00–0.07)
Basophils Absolute: 0.1 10*3/uL (ref 0.0–0.1)
Basophils Relative: 1 %
Eosinophils Absolute: 0.3 10*3/uL (ref 0.0–0.5)
Eosinophils Relative: 4 %
HCT: 30 % — ABNORMAL LOW (ref 36.0–46.0)
Hemoglobin: 9.6 g/dL — ABNORMAL LOW (ref 12.0–15.0)
Immature Granulocytes: 0 %
Lymphocytes Relative: 20 %
Lymphs Abs: 1.3 10*3/uL (ref 0.7–4.0)
MCH: 25.5 pg — ABNORMAL LOW (ref 26.0–34.0)
MCHC: 32 g/dL (ref 30.0–36.0)
MCV: 79.6 fL — ABNORMAL LOW (ref 80.0–100.0)
Monocytes Absolute: 0.8 10*3/uL (ref 0.1–1.0)
Monocytes Relative: 12 %
Neutro Abs: 4.1 10*3/uL (ref 1.7–7.7)
Neutrophils Relative %: 63 %
Platelets: 151 10*3/uL (ref 150–400)
RBC: 3.77 MIL/uL — ABNORMAL LOW (ref 3.87–5.11)
RDW: 17.5 % — ABNORMAL HIGH (ref 11.5–15.5)
WBC: 6.5 10*3/uL (ref 4.0–10.5)
nRBC: 0 % (ref 0.0–0.2)

## 2021-09-20 LAB — RENAL FUNCTION PANEL
Albumin: 3 g/dL — ABNORMAL LOW (ref 3.5–5.0)
Anion gap: 19 — ABNORMAL HIGH (ref 5–15)
BUN: 91 mg/dL — ABNORMAL HIGH (ref 6–20)
CO2: 25 mmol/L (ref 22–32)
Calcium: 9.4 mg/dL (ref 8.9–10.3)
Chloride: 87 mmol/L — ABNORMAL LOW (ref 98–111)
Creatinine, Ser: 7.98 mg/dL — ABNORMAL HIGH (ref 0.44–1.00)
GFR, Estimated: 6 mL/min — ABNORMAL LOW (ref >60)
Glucose, Bld: 174 mg/dL — ABNORMAL HIGH (ref 70–99)
Phosphorus: 7.5 mg/dL — ABNORMAL HIGH (ref 2.5–4.6)
Potassium: 3.9 mmol/L (ref 3.5–5.1)
Sodium: 131 mmol/L — ABNORMAL LOW (ref 135–145)

## 2021-09-20 LAB — GLUCOSE, CAPILLARY
Glucose-Capillary: 183 mg/dL — ABNORMAL HIGH (ref 70–99)
Glucose-Capillary: 180 mg/dL — ABNORMAL HIGH (ref 70–99)
Glucose-Capillary: 176 mg/dL — ABNORMAL HIGH (ref 70–99)
Glucose-Capillary: 167 mg/dL — ABNORMAL HIGH (ref 70–99)

## 2021-09-20 LAB — C-REACTIVE PROTEIN: CRP: 2.1 mg/dL — ABNORMAL HIGH (ref <1.0)

## 2021-09-20 MED ORDER — SCOPOLAMINE 1 MG/3DAYS TD PT72
1.0000 | MEDICATED_PATCH | TRANSDERMAL | Status: DC
  Administered 2021-09-20 – 2021-10-05 (×6): 1.5 mg via TRANSDERMAL
  Filled 2021-09-20 (×7): qty 1

## 2021-09-20 NOTE — Progress Notes (Signed)
Physical Therapy Treatment Patient Details Name: Janet Mitchell MRN: [id number] DOB: 29-Sep-1965 Today's Date: 09/20/2021   History of Present Illness 56 y.o. female presents to [facility] hospital as a transfer from [place] on 08/23/2021 with complaints of nausea, vomiting, L weakness. Pt required emergent intubation during transfer from [facility]. CTH reveals R thalamic hemorrhage with IVH. Pt underwent R frontal ventriculostomy on 5/29. Extubated 6/1, reintubated 6/2, tracheostomy 6/2. Pt failed EVD clamping and VP shunt placed 6/14. PMH includes HTN, CHF, MDII, ESRD.    PT Comments    Pt alert, interactive, able to follow simple commands consistently, and was attempting to vocalize today. Pt with significant improvement regarding level of arousal and ability to interact with therapist. Pt gave great effort at EOB in trying to maintain sitting balance using R UE to hold onto railing. Will trial increasing frequency to 3x/wk as pt is alert and participating in therapy. Acute PT to cont to follow.    Recommendations for follow up therapy are one component of a multi-disciplinary discharge planning process, led by the attending physician.  Recommendations may be updated based on patient status, additional functional criteria and insurance authorization.  Follow Up Recommendations  Long-term institutional care without follow-up therapy Can patient physically be transported by private vehicle: No   Assistance Recommended at Discharge Frequent or constant Supervision/Assistance  Patient can return home with the following Two people to help with walking and/or transfers;Two people to help with bathing/dressing/bathroom;Assistance with cooking/housework;Assistance with feeding;Direct supervision/assist for medications management;Direct supervision/assist for financial management;Assist for transportation;Help with stairs or ramp for entrance   Equipment Recommendations       Recommendations for Other  Services       Precautions / Restrictions Precautions Precautions: Fall Precaution Comments: dense L hemi, trach, VP shunt, cortrak Restrictions Weight Bearing Restrictions: No     Mobility  Bed Mobility Overal bed mobility: Needs Assistance Bed Mobility: Supine to Sit, Sit to Supine     Supine to sit: Total assist, +2 for physical assistance Sit to supine: Total assist, +2 for physical assistance   General bed mobility comments: pt unable to use L UE and LE functionally to assist with transfers, total assist for trunk elevation and bring LEs off EOB, HOB all the way elevated    Transfers                   General transfer comment: unsafe to assess today    Ambulation/Gait                   Stairs             Wheelchair Mobility    Modified Rankin (Stroke Patients Only) Modified Rankin (Stroke Patients Only) Pre-Morbid Rankin Score: No symptoms Modified Rankin: Severe disability     Balance Overall balance assessment: Needs assistance Sitting-balance support: Single extremity supported Sitting balance-Leahy Scale: Poor Sitting balance - Comments: pt was able to use R UE to hold onto rail and with verbal cues was able to maintain sitting with close min guard x 5 sec x 3 trials. Pt continues to requiring maxA majority of time to maintain EOB balance, L UE unable to assist due to flaccidity Postural control: Posterior lean                                  Cognition Arousal/Alertness: Awake/alert Behavior During Therapy: [facility] for tasks assessed/performed  General Comments: pt with eyes open and maintained them open t/o session, pt able to follow all simple step commands, pt will tend to her L side with verbal cues and was able to track to left to find PT x3 with cues        Exercises      General Comments General comments (skin integrity, edema, etc.): VSS 5Lo2 via track  collar      Pertinent Vitals/Pain Pain Assessment Pain Assessment: Faces Faces Pain Scale: No hurt    Home Living                          Prior Function            PT Goals (current goals can now be found in the care plan section) Acute Rehab PT Goals Patient Stated Goal: none stated PT Goal Formulation: With patient Time For Goal Achievement: 10/04/21 Potential to Achieve Goals: Poor Progress towards PT goals: Progressing toward goals    Frequency    Min 3X/week      PT Plan Current plan remains appropriate    Co-evaluation              AM-PAC PT "6 Clicks" Mobility   Outcome Measure  Help needed turning from your back to your side while in a flat bed without using bedrails?: Total Help needed moving from lying on your back to sitting on the side of a flat bed without using bedrails?: Total Help needed moving to and from a bed to a chair (including a wheelchair)?: Total Help needed standing up from a chair using your arms (e.g., wheelchair or bedside chair)?: Total Help needed to walk in hospital room?: Total Help needed climbing 3-5 steps with a railing? : Total 6 Click Score: 6    End of Session Equipment Utilized During Treatment: Oxygen Activity Tolerance: Patient tolerated treatment well Patient left: in bed;with call bell/phone within reach;with bed alarm set;with nursing/sitter in room (bed in chair position) Nurse Communication: Mobility status;Need for lift equipment PT Visit Diagnosis: Other abnormalities of gait and mobility (R26.89);Other symptoms and signs involving the nervous system (R29.898);Muscle weakness (generalized) (M62.81);Hemiplegia and hemiparesis Hemiplegia - Right/Left: Left Hemiplegia - dominant/non-dominant: Non-dominant Hemiplegia - caused by: Nontraumatic intracerebral hemorrhage     Time: 0855-0915 PT Time Calculation (min) (ACUTE ONLY): 20 min  Charges:  $Neuromuscular Re-education: 8-22 mins                      Lewis Shock, PT, DPT Acute Rehabilitation Services Secure chat preferred Office #: [phone]    Iona Hansen 09/20/2021, 11:31 AM

## 2021-09-21 ENCOUNTER — Ambulatory Visit: Payer: 59 | Admitting: Internal Medicine

## 2021-09-21 ENCOUNTER — Inpatient Hospital Stay (HOSPITAL_COMMUNITY): Payer: 59

## 2021-09-21 DIAGNOSIS — I629 Nontraumatic intracranial hemorrhage, unspecified: Secondary | ICD-10-CM | POA: Diagnosis not present

## 2021-09-21 LAB — RENAL FUNCTION PANEL
Albumin: 3.2 g/dL — ABNORMAL LOW (ref 3.5–5.0)
Anion gap: 17 — ABNORMAL HIGH (ref 5–15)
BUN: 52 mg/dL — ABNORMAL HIGH (ref 6–20)
CO2: 28 mmol/L (ref 22–32)
Calcium: 9.8 mg/dL (ref 8.9–10.3)
Chloride: 91 mmol/L — ABNORMAL LOW (ref 98–111)
Creatinine, Ser: 5.39 mg/dL — ABNORMAL HIGH (ref 0.44–1.00)
GFR, Estimated: 9 mL/min — ABNORMAL LOW (ref >60)
Glucose, Bld: 119 mg/dL — ABNORMAL HIGH (ref 70–99)
Phosphorus: 4.5 mg/dL (ref 2.5–4.6)
Potassium: 3.9 mmol/L (ref 3.5–5.1)
Sodium: 136 mmol/L (ref 135–145)

## 2021-09-21 LAB — CBC WITH DIFFERENTIAL/PLATELET
Abs Immature Granulocytes: 0.03 10*3/uL (ref 0.00–0.07)
Basophils Absolute: 0.1 10*3/uL (ref 0.0–0.1)
Basophils Relative: 1 %
Eosinophils Absolute: 0.3 10*3/uL (ref 0.0–0.5)
Eosinophils Relative: 5 %
HCT: 32.6 % — ABNORMAL LOW (ref 36.0–46.0)
Hemoglobin: 10.4 g/dL — ABNORMAL LOW (ref 12.0–15.0)
Immature Granulocytes: 1 %
Lymphocytes Relative: 20 %
Lymphs Abs: 1.2 10*3/uL (ref 0.7–4.0)
MCH: 25.7 pg — ABNORMAL LOW (ref 26.0–34.0)
MCHC: 31.9 g/dL (ref 30.0–36.0)
MCV: 80.7 fL (ref 80.0–100.0)
Monocytes Absolute: 0.8 10*3/uL (ref 0.1–1.0)
Monocytes Relative: 13 %
Neutro Abs: 3.7 10*3/uL (ref 1.7–7.7)
Neutrophils Relative %: 60 %
Platelets: 158 10*3/uL (ref 150–400)
RBC: 4.04 MIL/uL (ref 3.87–5.11)
RDW: 18.6 % — ABNORMAL HIGH (ref 11.5–15.5)
WBC: 6.1 10*3/uL (ref 4.0–10.5)
nRBC: 0 % (ref 0.0–0.2)

## 2021-09-21 LAB — GLUCOSE, CAPILLARY
Glucose-Capillary: 133 mg/dL — ABNORMAL HIGH (ref 70–99)
Glucose-Capillary: 142 mg/dL — ABNORMAL HIGH (ref 70–99)
Glucose-Capillary: 156 mg/dL — ABNORMAL HIGH (ref 70–99)
Glucose-Capillary: 170 mg/dL — ABNORMAL HIGH (ref 70–99)
Glucose-Capillary: 181 mg/dL — ABNORMAL HIGH (ref 70–99)
Glucose-Capillary: 196 mg/dL — ABNORMAL HIGH (ref 70–99)
Glucose-Capillary: 197 mg/dL — ABNORMAL HIGH (ref 70–99)

## 2021-09-21 LAB — C-REACTIVE PROTEIN: CRP: 2.2 mg/dL — ABNORMAL HIGH (ref <1.0)

## 2021-09-21 MED ORDER — PROSOURCE TF PO LIQD
45.0000 mL | Freq: Every day | ORAL | Status: DC
  Administered 2021-09-23 – 2021-09-30 (×7): 45 mL
  Filled 2021-09-21 (×7): qty 45

## 2021-09-21 MED ORDER — NEPRO/CARBSTEADY PO LIQD
1000.0000 mL | ORAL | Status: DC
  Administered 2021-09-21 – 2021-09-30 (×8): 1000 mL
  Filled 2021-09-21 (×14): qty 1000

## 2021-09-21 MED ORDER — CHLORHEXIDINE GLUCONATE CLOTH 2 % EX PADS
6.0000 | MEDICATED_PAD | Freq: Every day | CUTANEOUS | Status: DC
  Administered 2021-09-22 – 2021-09-23 (×2): 6 via TOPICAL

## 2021-09-21 NOTE — Progress Notes (Signed)
Nutrition Follow-up  DOCUMENTATION CODES:   Obesity unspecified  INTERVENTION:   Continue continuous tube feeds via PEG: - Nepro @ 50 ml/hr (1200 ml/day) - ProSource TF 45 ml daily  Tube feeding regimen provides 2164 kcal, 108 grams of protein, and 872 ml of H2O.   - RD will monitor PO intake and for ability to transition to nocturnal tube feeding regimen   If nocturnal tube feeding regimen becomes appropriate, recommend: - Nepro @ 65 ml/hr x 12 hours from 6 PM to 6 AM (total of 780 ml) - ProSource TF 45 ml BID  Recommended nocturnal tube feeding regimen would provide 1460 kcal, 85 grams of protein, and 567 ml of H2O (meets 77% of minimum kcal needs and 85% of minimum protein needs).  NUTRITION DIAGNOSIS:   Inadequate oral intake related to inability to eat (pt sedated and ventilated) as evidenced by NPO status.  Progressing, pt now on dysphagia 1 diet with thin liquids  GOAL:   Patient will meet greater than or equal to 90% of their needs  Met via continuous TF  MONITOR:   PO intake, Labs, Weight trends, TF tolerance, I & O's  REASON FOR ASSESSMENT:   Ventilator    ASSESSMENT:   56 y/o female with h/o ESRD on HD, HTN, DM, COVID 19 (03/2019) and CHF who is admitted with right thalamus ICH with IVH and obstructive hydrocephalus s/p right frontal ventriculostomy with EVD placement 5/29.  05/29 - s/p EVD 06/02 - s/p trach and Cortrak placement  06/14 - s/p VP shunt 06/22 - s/p PEG tube placement 06/23 - TF rate decreased to 35 ml/hr per MD 06/24 - TF rate increased to 45 ml/hr per MD 06/27 - TF rate increased to goal of 50 ml/hr per MD, s/p MBS with diet advanced to dysphagia 1 with thin liquids  Last HD was on 6/26 with 1800 ml net UF.  Met with pt at bedside. Pt looked at RD and attempted to communicate but RD unable to decipher what pt was trying to say. Noted pt had MBS this morning with recommendations for a dysphagia 1 diet with thin liquids with multiple  compensations recommended per SLP: minimize environmental distractions; slow rate; small sips/bites; monitor for anterior loss; follow solids with liquid as needed to reduce oral residue; check L side of oral cavity for pocketing; have suction available to clear oral cavity as needed; and liquid by straw to reduce anterior spillage.  RD to keep pt on continuous tube feeds at this time given concern that PO intake will be poor. Will monitor PO intake and ability to transition pt to a nocturnal tube feeding regimen.  Pt with an overall weight loss of 10.4 kg since admission (10% weight loss in 1 month) which is significant for timeframe. Suspect some true dry weight loss is present. Pt at risk for malnutrition. RD will continue to monitor weight trends.  Admit weight: 104.5 kg Current weight: 94.1 kg  Medications reviewed and include: calcitriol, aranesp, nutrisource fiber BID, SSI q 4 hours, rena-vit, protonix, scopolamine patch, renvela 0.8 grams TID, IV abx  Labs reviewed. CBG's: 133-181 x 24 hours  I/O's: +5.2 L since admit  Diet Order:   Diet Order             DIET - DYS 1 Room service appropriate? Yes; Fluid consistency: Thin  Diet effective now                   EDUCATION NEEDS:  No education needs have been identified at this time  Skin:  Skin Assessment: Skin Integrity Issues: Incisions: head, abdomen Other: eschar L great toe amputation site  Last BM:  09/20/21 type 6  Height:   Ht Readings from Last 1 Encounters:  09/09/21 5\' 7"  (1.702 m)    Weight:   Wt Readings from Last 1 Encounters:  09/20/21 94.1 kg    Ideal Body Weight:  61.3 kg  BMI:  Body mass index is 32.49 kg/m.  Estimated Nutritional Needs:   Kcal:  1900-2200kcal/day  Protein:  100-120  Fluid:  1L+UOP    Mertie Clause, MS, RD, LDN Inpatient Clinical Dietitian Please see [facility] for contact information.

## 2021-09-22 DIAGNOSIS — Z9889 Other specified postprocedural states: Secondary | ICD-10-CM

## 2021-09-22 DIAGNOSIS — R1310 Dysphagia, unspecified: Secondary | ICD-10-CM

## 2021-09-22 DIAGNOSIS — I629 Nontraumatic intracranial hemorrhage, unspecified: Secondary | ICD-10-CM | POA: Diagnosis not present

## 2021-09-22 LAB — IRON AND TIBC
Iron: 37 ug/dL (ref 28–170)
Saturation Ratios: 16 % (ref 10.4–31.8)
TIBC: 238 ug/dL — ABNORMAL LOW (ref 250–450)
UIBC: 201 ug/dL

## 2021-09-22 LAB — CBC WITH DIFFERENTIAL/PLATELET
Abs Immature Granulocytes: 0.03 10*3/uL (ref 0.00–0.07)
Basophils Absolute: 0.1 10*3/uL (ref 0.0–0.1)
Basophils Relative: 1 %
Eosinophils Absolute: 0.3 10*3/uL (ref 0.0–0.5)
Eosinophils Relative: 3 %
HCT: 33.9 % — ABNORMAL LOW (ref 36.0–46.0)
Hemoglobin: 10.4 g/dL — ABNORMAL LOW (ref 12.0–15.0)
Immature Granulocytes: 0 %
Lymphocytes Relative: 18 %
Lymphs Abs: 1.4 10*3/uL (ref 0.7–4.0)
MCH: 24.8 pg — ABNORMAL LOW (ref 26.0–34.0)
MCHC: 30.7 g/dL (ref 30.0–36.0)
MCV: 80.7 fL (ref 80.0–100.0)
Monocytes Absolute: 0.8 10*3/uL (ref 0.1–1.0)
Monocytes Relative: 11 %
Neutro Abs: 5.1 10*3/uL (ref 1.7–7.7)
Neutrophils Relative %: 67 %
Platelets: 170 10*3/uL (ref 150–400)
RBC: 4.2 MIL/uL (ref 3.87–5.11)
RDW: 18.7 % — ABNORMAL HIGH (ref 11.5–15.5)
WBC: 7.7 10*3/uL (ref 4.0–10.5)
nRBC: 0 % (ref 0.0–0.2)

## 2021-09-22 LAB — RENAL FUNCTION PANEL
Albumin: 3.5 g/dL (ref 3.5–5.0)
Anion gap: 17 — ABNORMAL HIGH (ref 5–15)
BUN: 69 mg/dL — ABNORMAL HIGH (ref 6–20)
CO2: 28 mmol/L (ref 22–32)
Calcium: 10.2 mg/dL (ref 8.9–10.3)
Chloride: 89 mmol/L — ABNORMAL LOW (ref 98–111)
Creatinine, Ser: 6.83 mg/dL — ABNORMAL HIGH (ref 0.44–1.00)
GFR, Estimated: 7 mL/min — ABNORMAL LOW (ref >60)
Glucose, Bld: 119 mg/dL — ABNORMAL HIGH (ref 70–99)
Phosphorus: 6.1 mg/dL — ABNORMAL HIGH (ref 2.5–4.6)
Potassium: 4.4 mmol/L (ref 3.5–5.1)
Sodium: 134 mmol/L — ABNORMAL LOW (ref 135–145)

## 2021-09-22 LAB — GLUCOSE, CAPILLARY
Glucose-Capillary: 116 mg/dL — ABNORMAL HIGH (ref 70–99)
Glucose-Capillary: 117 mg/dL — ABNORMAL HIGH (ref 70–99)
Glucose-Capillary: 119 mg/dL — ABNORMAL HIGH (ref 70–99)
Glucose-Capillary: 121 mg/dL — ABNORMAL HIGH (ref 70–99)
Glucose-Capillary: 156 mg/dL — ABNORMAL HIGH (ref 70–99)

## 2021-09-22 LAB — FERRITIN: Ferritin: 1402 ng/mL — ABNORMAL HIGH (ref 11–307)

## 2021-09-22 LAB — C-REACTIVE PROTEIN: CRP: 3 mg/dL — ABNORMAL HIGH (ref <1.0)

## 2021-09-22 NOTE — Assessment & Plan Note (Addendum)
Follow per renal

## 2021-09-22 NOTE — Assessment & Plan Note (Addendum)
BP trending on higher side at times (at times wide fluctuations, but generally trending higher) Increase amlodipine to 5 mg and coreg to 6.25 mg twice daily at discharge Continue to adjust as needed Long term goal normotensive (keep SBP <160)

## 2021-09-22 NOTE — Progress Notes (Signed)
[facility] Progress Note  Subjective: pt seen in room, per RN she was sitting up in bed and interacting this am. Lurline Idol was capped overnight, pulm is following this.   Vitals:   09/22/21 0812 09/22/21 0818 09/22/21 1020 09/22/21 1030  BP:  (!) 148/76 131/70 129/73  Pulse:  77 72 76  Resp:  '18 17 18  '$ Temp: 98 F (36.7 C)   98 F (36.7 C)  TempSrc: Oral   Oral  SpO2:  94% 94% 95%  Weight:      Height:        Physical Exam:         Adult female in bed in NAD Trach is capped this am Chest cta bilaterally; unlabored S1S2 no rub Abd soft ntnd  Ext no LE edema appreciated Neuro - upper left gaze Access: LUE AVF bruit and thrill   Home meds include - asa, tylenol, renavite, cadexomer iodine, phoslo, ceterizine, cholestyramine, hydralazine 10 bid, labetalol 200 bid, losartan 100, psyllium   CXR 6/24 and 6/26 > IMPRESSION: There has been no significant interval change in diffuse interstitial/reticulonodular opacities throughout the lungs. No new finding.      OP HD: [facility] MWF  4h  97.5kg  450/500  2/2.5 bath  15ga  AVF  Hep none - mircera 100 ug q2 - rocaltrol 1.0 mcg po tiw   Assessment/ Plan Right thalamic intracranial hemorrhage - w/ intraventricular hemorrhage and hydrocephalus. Underwent ventriculostomy drain placement, then VP shunt surgery was done on 6/14 per neurosurgery.  Resp failure - sp trach. Trach capped overnight and pulm following.  ESRD - gets HD MWF. Plan HD today upstairs.  HTN/volume  - is under dry wt, no vol excess on exam. Last 2 CXR's grossly clear, mild diffuse IS changes per radiology. UF goal 1-2 L w/ HD today.  Anemia CKD - got aranesp on 6/17 at 100 mcg off schedule.  Is ordered for 100 mcg weekly on Fridays.  Hb 10's. Will get tsat / ferritin.  CKD-MBD - On calcitriol. We transitioned to renvela due to hypercalcemia while here.  Improved. Only 2.5 calcium dialysate available here.  On nepro.  Acute hypoxic resp failure/ s/p  trach.  Per primary team  Atrial fib - per primary team Disposition - if trach can be dc'd, then she could got to SNF if able to sit in a chair for several hrs.    Kelly Splinter, MD 09/22/2021, 10:45 AM    Recent Labs  Lab 09/21/21 0800 09/22/21 0326  HGB 10.4* 10.4*  ALBUMIN 3.2* 3.5  CALCIUM 9.8 10.2  PHOS 4.5 6.1*  CREATININE 5.39* 6.83*  K 3.9 4.4    Inpatient medications:  amantadine  200 mg Per Tube Weekly   amLODipine  10 mg Per Tube Daily   calcitRIOL  1 mcg Per Tube Q M,W,F   carvedilol  6.25 mg Per NG tube BID WC   chlorhexidine  15 mL Mouth Rinse BID   Chlorhexidine Gluconate Cloth  6 each Topical Q0600   darbepoetin (ARANESP) injection - DIALYSIS  100 mcg Intravenous Q Fri-HD   feeding supplement (PROSource TF)  45 mL Per Tube Daily   fiber  1 packet Per Tube BID   heparin injection (subcutaneous)  5,000 Units Subcutaneous Q8H   insulin aspart  0-9 Units Subcutaneous Q4H   multivitamin  1 tablet Per Tube QHS   mouth rinse  15 mL Mouth Rinse 4 times per day   pantoprazole sodium  40  mg Per Tube Daily   scopolamine  1 patch Transdermal Q72H   sevelamer carbonate  0.8 g Per Tube TID WC    sodium chloride 0 mL/hr at 09/06/21 1141   ampicillin-sulbactam (UNASYN) IV 1.5 g (09/22/21 0258)   feeding supplement (NEPRO CARB STEADY) Stopped (09/21/21 2030)   acetaminophen **OR** acetaminophen (TYLENOL) oral liquid 160 mg/5 mL **OR** acetaminophen, fentaNYL (SUBLIMAZE) injection, Gerhardt's butt cream, hydrALAZINE, ipratropium-albuterol, labetalol, ondansetron (ZOFRAN) IV, mouth rinse, polyvinyl alcohol, senna-docusate

## 2021-09-22 NOTE — Procedures (Signed)
Tracheostomy Change Note  Patient Details:   Name: [name] DOB: [birth year] MRN: [id number]    Airway Documentation:  Patient found with trach half way out and still capped. Orders from Dr Valeta Harms to decannulate. Pt's trach removed and dressing used to cover stoma. Pt tolerating well.    Evaluation  O2 sats: stable throughout Complications: No apparent complications Patient did tolerate procedure well. Bilateral Breath Sounds: Diminished, Rhonchi    Janet Mitchell 09/22/2021, 5:12 PM

## 2021-09-22 NOTE — Assessment & Plan Note (Addendum)
Elevated ferritin Suspect AOCD Will check b12/folate

## 2021-09-22 NOTE — Progress Notes (Signed)
PCCM:  S: 56 yo FM, Right thalamic bleed, left hemiparesis, ultimately trached, obstructed hydrocephalus s/p VP shunt   O: BP 129/73 (BP Location: Right Arm)   Pulse 76   Temp 98 F (36.7 C) (Oral)   Resp 18   Ht '5\' 7"'$  (1.702 m)   Wt 95.4 kg   SpO2 95%   BMI 32.94 kg/m   Gen: obese fm  HENT: trach in place, capped  Heart: RRR, S1 S2  Lungs: CTAB, no wheeze  Abd: soft, nt nd  Ext: no edema   Labs reviewed   A: ICH, Right thalamic bleed  Obstructive hydrocephalus s/p vp shunt  Chronic respiratory failure, s/p trach  - passed trach capping trial on room air since yesterday  ESRD on iHD   P: Plans to de-cannulate today after dialysis session    Garner Nash, DO [facility] 09/22/2021 9:35 AM

## 2021-09-22 NOTE — Assessment & Plan Note (Signed)
a1c 6.2 follow

## 2021-09-22 NOTE — Assessment & Plan Note (Addendum)
Head CT 5/29 with 5 cc acute hematoma in R thalamus with intraventricular extension S/p external ventricular drain placement by neurosurgery on 5/29 Subsequent VP shunt placement 6/14 Hemorrhage was thought due to uncontrolled hypertension.  Brain bleed contributing to severe left-sided weakness, dysphagia along with toxic encephalopathy Was seen by neurosurgery and neurology Avoid SBP >160 S/p peg placement on 6/22 Awaiting SNF

## 2021-09-22 NOTE — Assessment & Plan Note (Addendum)
SLP recommending dysphagia 3, thin liquids, meds crushed with puree S/p PEG placement RD recommending bolus tube feeds via peg (calorie count limited, limited meal ticket documentation - pt and husband report poor appetite and poor PO intake)

## 2021-09-22 NOTE — Hospital Course (Addendum)
Brief Narrative (HPI from H&P)  56 y.o. female with history of ESRD on HD MWF, CHF, poorly controlled HTN, DM presenting with nausea, vomiting, left side weakness present upon waking.  Scented to [facility] on 08/23/2021 with nausea vomiting left-sided weakness, work-up showed right-sided thalamic hemorrhage she was intubated and transferred to [facility] under ICU care for further treatment.  She was seen by [facility], nephrology for HD, neurology and neurosurgery, external ventricular drain placement by neurosurgery on 08/23/2021.  She also was intubated, trached, during her hospital stay she had tracheostomy site infection for which she is on antibiotic.  She has been transferred to hospitalist service on 09/13/2021 on day 20 one of her hospital stay with plan of going to [facility] versus SNF.     Events procedures   5/28 > Presents to [facility] ED, intubated. 5/29 > Transferred to [facility] Echocardiogram with EF 60%, no acute changes. 5/30 > HD, more awake, following commands with RUE/RLE 6/1 No issues overnight, she remains alert and interactive on vent.  Extubated 6/2 failed extubation requiring reintubation. Trached. 6/4 Placed on ATC yesterday afternoon and has tolerated well since 6/5: EVD raised to 20 cm H20 6/7: Failed EVD clamping trial after developing emesis and headache 6/8: Failed EVD clamping due to emesis 6/9: Increase EVD pressure to 25 6/12 back on vent due to hypoxia from mucus plugging, started back on antibiotics 6/14 VP shunt placed. Antibiotics adjusted based on cultures 6/17-has been off the vent since 09/10/2021, did not require to be on overnight. 6/19.  Transferred to hospitalist service 09/16/2021 PEG tube placed by IR Dr. Anselm Pancoast 6/28 decannulated 7/5 tolerating dialysis in chair Plan for SNF 7/11

## 2021-09-22 NOTE — Progress Notes (Signed)
PROGRESS NOTE    Janet Mitchell  WCB:[id number] DOB: [birth year] DOA: 08/23/2021 PCP: Lindell Spar, MD  Chief Complaint  Patient presents with   Code Stroke    Brief Narrative:  Brief Narrative (HPI from H&P)  56 y.o. female with history of ESRD on HD MWF, CHF, poorly controlled HTN, DM presenting with nausea, vomiting, left side weakness present upon waking.  Scented to [facility] on 08/23/2021 with nausea vomiting left-sided weakness, work-up showed right-sided thalamic hemorrhage she was intubated and transferred to [facility] under ICU care for further treatment.  She was seen by [facility], nephrology for HD, neurology and neurosurgery, external ventricular drain placement by neurosurgery on 08/23/2021.  She also was intubated, trached, during her hospital stay she had tracheostomy site infection for which she is on antibiotic.  She has been transferred to hospitalist service on 09/13/2021 on day 20 one of her hospital stay with plan of going to [facility] versus SNF.     Events procedures   5/28 > Presents to [facility] ED, intubated. 5/29 > Transferred to [facility] Echocardiogram with EF 60%, no acute changes. 5/30 > HD, more awake, following commands with RUE/RLE 6/1 No issues overnight, she remains alert and interactive on vent.  Extubated 6/2 failed extubation requiring reintubation. Trached. 6/4 Placed on ATC yesterday afternoon and has tolerated well since 6/5: EVD raised to 20 cm H20 6/7: Failed EVD clamping trial after developing emesis and headache 6/8: Failed EVD clamping due to emesis 6/9: Increase EVD pressure to 25 6/12 back on vent due to hypoxia from mucus plugging, started back on antibiotics 6/14 VP shunt placed. Antibiotics adjusted based on cultures 6/17-has been off the vent since 09/10/2021, did not require to be on overnight. 6/19.  Transferred to hospitalist service 09/16/2021 PEG tube placed by IR Dr. Anselm Pancoast     Assessment & Plan:   Principal Problem:    Intracranial hemorrhage ([place]) Active Problems:   Respiratory failure requiring intubation ([place])   History of tracheostomy   ESRD (end stage renal disease) on dialysis [facility])   Dysphagia   Essential hypertension   Anemia   Type 2 diabetes mellitus (HCC)   Obesity, morbid, BMI 50 or higher ([place])   Assessment and Plan: * Intracranial hemorrhage ([place]) Head CT 5/29 with 5 cc acute hematoma in R thalamus with intraventricular extension S/p external ventricular drain placement by neurosurgery on 5/29 Subsequent VP shunt placement 6/14 Hemorrhage was thought due to uncontrolled hypertension.  Brain bleed contributing to severe left-sided weakness, dysphagia along with toxic encephalopathy Was seen by neurosurgery and neurology Goal SBP <160 Suspect she'll need LTACH S/p peg placement on 6/22  Respiratory failure requiring intubation ([place]) Now with trach collar with tracheostomy site infection.  She has finished her course of antibiotics for treatment of Enterobacter and Staph aureus, with antibiotic stop date 09/15/2021 per PCCM team.  Tracheostomy deferred to PCCM.  There is Aiden Rao question of ongoing aspiration of tube feeds also while getting PEG tube placed she had emesis and IR physician was suspicious of aspiration during the procedure on 09/16/2021.    She had increase in her tracheostomy site secretions, chest x-ray and CT scan showed bilateral infiltrates, although she has no fever or leukocytosis but due to witnessed aspiration during PEG tube placement and radiological findings will give her 5 days of Unasyn carting on 09/17/2021 and monitor.  Clinically gradual improvement.  Secretions also improved after addition of scopolamine patch on 09/20/2021.    S/p MBS  by speech, recommending dysphagia 1, thin liquids  Per pulm, plan for decannulation today after dialysis  Continue to monitor for any signs of ongoing aspiration in the future.   Dysphagia SLP recommending dysphagia 1, thin  liquids, meds crushed with puree S/p PEG placement  ESRD (end stage renal disease) on dialysis [facility]) nephrology following  Essential hypertension Amlodipine, coreg Maintain BP < 160  Anemia Elevated ferritin Suspect AOCD Will check b12/folate  Type 2 diabetes mellitus (HCC) a1c 6.2 follow  Obesity, morbid, BMI 50 or higher ([place]) noted       DVT prophylaxis: heparin Code Status: full Family Communication: none at bedside Disposition:   Status is: Inpatient Remains inpatient appropriate because: pending placement   Consultants:  Neurology Neurosurgery PCCM IR  Procedures:  Echo IMPRESSIONS     1. Left ventricular ejection fraction, by estimation, is 60 to 65%. The  left ventricle has normal function. The left ventricle has no regional  wall motion abnormalities. There is severe left ventricular hypertrophy.  Left ventricular diastolic parameters   are consistent with Grade II diastolic dysfunction (pseudonormalization).  Elevated left atrial pressure.   2. Right ventricular systolic function is normal. The right ventricular  size is normal.   3. Left atrial size was moderately dilated.   4. The mitral valve is normal in structure. Trivial mitral valve  regurgitation. No evidence of mitral stenosis.   5. The aortic valve is tricuspid. Aortic valve regurgitation is not  visualized. Aortic valve sclerosis is present, with no evidence of aortic  valve stenosis.   6. The inferior vena cava is normal in size with greater than 50%  respiratory variability, suggesting right atrial pressure of 3 mmHg.  6/23 IMPRESSION: Successful fluoroscopic guided percutaneous gastrostomy tube placement.  5/29 external ventricular drain 6/2 trach 6/14 VP shunt placed   Antimicrobials:  Anti-infectives (From admission, onward)    Start     Dose/Rate Route Frequency Ordered Stop   09/17/21 1545  ampicillin-sulbactam (UNASYN) 1.5 g in sodium chloride 0.9 % 100 mL IVPB         1.5 g 200 mL/hr over 30 Minutes Intravenous Every 12 hours 09/17/21 1445 09/22/21 1544   09/16/21 1543  ceFAZolin (ANCEF) IVPB 2g/100 mL premix        over 30 Minutes Intravenous Continuous PRN 09/16/21 1552 09/16/21 1543   09/16/21 0500  ceFAZolin (ANCEF) IVPB 2g/100 mL premix        2 g 200 mL/hr over 30 Minutes Intravenous To Radiology 09/15/21 1358 09/17/21 0500   09/09/21 1800  ceFEPIme (MAXIPIME) 1 g in sodium chloride 0.9 % 100 mL IVPB        1 g 200 mL/hr over 30 Minutes Intravenous Every 24 hours 09/08/21 1116 09/15/21 1759   09/08/21 1330  ceFEPIme (MAXIPIME) 1 g in sodium chloride 0.9 % 100 mL IVPB        1 g 200 mL/hr over 30 Minutes Intravenous NOW 09/08/21 1234 09/08/21 1352   09/08/21 1215  cefTRIAXone (ROCEPHIN) 1 g in sodium chloride 0.9 % 100 mL IVPB  Status:  Discontinued        1 g 200 mL/hr over 30 Minutes Intravenous  Once 09/08/21 1116 09/08/21 1234   09/08/21 1200  vancomycin (VANCOCIN) IVPB 1000 mg/200 mL premix  Status:  Discontinued        1,000 mg 200 mL/hr over 60 Minutes Intravenous Every M-W-F (Hemodialysis) 09/06/21 1518 09/07/21 0741   09/08/21 0730  ceFAZolin (ANCEF) IVPB 2g/100 mL premix  2 g 200 mL/hr over 30 Minutes Intravenous On call to O.R. 09/08/21 2130 09/08/21 0848   09/06/21 2200  vancomycin (VANCOCIN) IVPB 1000 mg/200 mL premix  Status:  Discontinued        1,000 mg 200 mL/hr over 60 Minutes Intravenous  Once 09/06/21 1518 09/06/21 2156   09/06/21 2000  piperacillin-tazobactam (ZOSYN) IVPB 2.25 g  Status:  Discontinued        2.25 g 100 mL/hr over 30 Minutes Intravenous Every 8 hours 09/06/21 1518 09/08/21 1116   09/06/21 1115  piperacillin-tazobactam (ZOSYN) IVPB 3.375 g        3.375 g 100 mL/hr over 30 Minutes Intravenous  Once 09/06/21 1023 09/06/21 1132   09/06/21 1115  vancomycin (VANCOREADY) IVPB 2000 mg/400 mL        2,000 mg 200 mL/hr over 120 Minutes Intravenous  Once 09/06/21 1024 09/06/21 1342   08/23/21 1230   piperacillin-tazobactam (ZOSYN) IVPB 2.25 g        2.25 g 100 mL/hr over 30 Minutes Intravenous Every 8 hours 08/23/21 1141 08/28/21 0523       Subjective: Denies any complaints, asks for help with TV on dialysis   Objective: Vitals:   09/22/21 1500 09/22/21 1530 09/22/21 1545 09/22/21 1607  BP: 119/74 126/79 126/76 (!) 130/96  Pulse: 74 77 77 81  Resp: '14 17 20 '$ (!) 21  Temp:    97.7 F (36.5 C)  TempSrc:    Axillary  SpO2: 98% 96% 94% 93%  Weight:    (S) (!) 204.8 kg  Height:        Intake/Output Summary (Last 24 hours) at 09/22/2021 1845 Last data filed at 09/22/2021 1545 Gross per 24 hour  Intake --  Output 2000 ml  Net -2000 ml   Filed Weights   09/22/21 0434 09/22/21 1020 09/22/21 1607  Weight: 95.4 kg (!) 210.7 kg (S) (!) 204.8 kg    Examination:  General exam: Appears calm and comfortable  Respiratory system: unlabored, trach Cardiovascular system: RRR Gastrointestinal system: Abdomen is nondistended, soft and nontender. peg Central nervous system: L sided weakness - able to understand some of what she says, but limited Extremities: no LEE    Data Reviewed: I have personally reviewed following labs and imaging studies  CBC: Recent Labs  Lab 09/17/21 0028 09/18/21 0538 09/19/21 0405 09/20/21 0026 09/21/21 0800 09/22/21 0326  WBC 7.3 6.1 6.1 6.5 6.1 7.7  NEUTROABS 4.8  --  3.6 4.1 3.7 5.1  HGB 9.7* 9.7* 9.7* 9.6* 10.4* 10.4*  HCT 30.2* 30.4* 31.1* 30.0* 32.6* 33.9*  MCV 79.3* 79.4* 79.3* 79.6* 80.7 80.7  PLT 166 154 151 151 158 865    Basic Metabolic Panel: Recent Labs  Lab 09/18/21 0205 09/19/21 0405 09/20/21 0026 09/21/21 0800 09/22/21 0326  NA 134* 133* 131* 136 134*  K 3.7 3.8 3.9 3.9 4.4  CL 91* 91* 87* 91* 89*  CO2 '29 28 25 28 28  '$ GLUCOSE 145* 130* 174* 119* 119*  BUN 35* 66* 91* 52* 69*  CREATININE 4.38* 6.59* 7.98* 5.39* 6.83*  CALCIUM 9.5 9.7 9.4 9.8 10.2  PHOS 5.2* 6.5* 7.5* 4.5 6.1*    GFR: Estimated Creatinine  Clearance: 17.9 mL/min (Evie Croston) (by C-G formula based on SCr of 6.83 mg/dL (H)).  Liver Function Tests: Recent Labs  Lab 09/18/21 0205 09/19/21 0405 09/20/21 0026 09/21/21 0800 09/22/21 0326  ALBUMIN 3.2* 3.1* 3.0* 3.2* 3.5    CBG: Recent Labs  Lab 09/21/21 1919 09/21/21 2331 09/22/21 0325  09/22/21 0812 09/22/21 1721  GLUCAP 196* 142* 117* 116* 119*     No results found for this or any previous visit (from the past 240 hour(s)).       Radiology Studies: DG Swallowing Func-Speech Pathology  Result Date: 09/21/2021 Table formatting from the original result was not included. Images from the original result were not included. Objective Swallowing Evaluation: Type of Study: MBS-Modified Barium Swallow Study  Patient Details Name: Leontyne Manville MRN: [id number] Date of Birth: 13-Apr-1965 Today's Date: 09/21/2021 Time: SLP Start Time (ACUTE ONLY): 0910 -SLP Stop Time (ACUTE ONLY): 0940 SLP Time Calculation (min) (ACUTE ONLY): 30 min Past Medical History: Past Medical History: Diagnosis Date  Abdominal wall hematoma 12/26/2019  Anemia   Arthritis   CHF (congestive heart failure) ([place])   pt states she has been cleared of heart failure/disease  Chronic cholecystitis with calculus   COVID-19 03/26/2019  COVID-19 virus infection 03/2019  ESRD (end stage renal disease) on dialysis [facility])   Dialysis M-W-F  Headache   "Varshini Arrants few/wk" (03/09/2018) - no longer having these  History of blood transfusion 10/2017  "low blood count" (03/09/2018)  Hypertension   Seasonal allergies   Spinal headache   Type II diabetes mellitus ([place])  Past Surgical History: Past Surgical History: Procedure Laterality Date  AMPUTATION TOE Left 2013  Great toe  AV FISTULA PLACEMENT Left 07/22/2019  Procedure: LEFT ARM BASILIC ARTERIOVENOUS (AV) FISTULA CREATION;  Surgeon: Rosetta Posner, MD;  Location: [place];  Service: Vascular;  Laterality: Left;  [place] Left 10/17/2019  Procedure: LEFT SECOND STAGE [place];  Surgeon: Rosetta Posner, MD;  Location: [place] OR;  Service: Vascular;  Laterality: Left;  BIOPSY  10/24/2018  Procedure: BIOPSY;  Surgeon: Daneil Dolin, MD;  [facility];  Service: Endoscopy;;  right and left colon  CATARACT EXTRACTION W/ INTRAOCULAR LENS IMPLANT Right   [place]; [place] N/Clemente Dewey 03/09/2018  Procedure: LAPAROSCOPIC CHOLECYSTECTOMY WITH INTRAOPERATIVE CHOLANGIOGRAM ERAS PATHWAY;  Surgeon: Donnie Mesa, MD;  Location: [place];  Service: General;  Laterality: N/Mccartney Chuba;  COLONOSCOPY N/Shaheem Pichon 10/24/2018  Procedure: COLONOSCOPY;  Surgeon: Daneil Dolin, MD;  [facility];  Service: Endoscopy;  Laterality: N/Gurshan Settlemire;  1:00pm  EYE SURGERY Left 05/15/2018  Removal of blood in the globe (due to DM)  FLEXIBLE SIGMOIDOSCOPY N/Isabellah Sobocinski 11/24/2017  Procedure: FLEXIBLE SIGMOIDOSCOPY;  Surgeon: Daneil Dolin, MD;  [facility];  Service: Endoscopy;  Laterality: N/Kandi Brusseau;  IR FLUORO GUIDE CV LINE RIGHT  07/17/2019  IR GASTROSTOMY TUBE MOD SED  09/16/2021  IR PARACENTESIS  07/09/2019  IR US GUIDE VASC ACCESS RIGHT  07/17/2019  LAPAROSCOPIC CHOLECYSTECTOMY  03/09/2018  LAPAROSCOPIC REVISION VENTRICULAR-PERITONEAL (V-P) SHUNT  09/08/2021  Procedure: LAPAROSCOPIC ASSISTANCE FOR  VENTRICULAR-PERITONEAL (V-P) SHUNT PLACEMENT ;  Surgeon: Vallarie Mare, MD;  Location: [facility] OR;  Service: Neurosurgery;;  PERITONEAL CATHETER INSERTION  2017  POLYPECTOMY  10/24/2018  Procedure: POLYPECTOMY;  Surgeon: Daneil Dolin, MD;  [facility];  Service: Endoscopy;;  TOTAL HIP ARTHROPLASTY Left 1997  VENTRICULOPERITONEAL SHUNT N/Imya Mance 09/08/2021  Procedure: SHUNT INSERTION VENTRICULAR-PERITONEAL;  Surgeon: Vallarie Mare, MD;  Location: [place];  Service: Neurosurgery;  Laterality: N/Georjean Toya; HPI: Patient is Shay Jhaveri 56 y.o. female who presented to [facility] ER as Vernice Bowker code stroke with c/o nausea and vomiting upon waking up in the morning along with left sided weakness. She was communicative at [facility] but during  stay awaiting transfer to [facility]  she required emergent intubation due to inability to protect airway. 6/1 CXR showed Diffuse bilateral lung opacities may be due to pulmonary edema or multifocal pneumonia. MRI brain showed right thalamic intraparenchymal hemorrhage  with surrounding edema. She was extubated on 6/1 and although initially tolerated well, quickly was seen with poor, ineffective cough, copious secretions unable to clear independently but in no obvious distress and oxygen saturations 100% on 4L nasal cannula. She was reintubated on 6/2 to tracheostomy #6 cuffed Shiley. Pt with PMH: ESRD on HD MWF, CHF, HTN, DM.  Subjective: Pt awake, alert, pleasant, participative  Recommendations for follow up therapy are one component of Siobahn Worsley multi-disciplinary discharge planning process, led by the attending physician.  Recommendations may be updated based on patient status, additional functional criteria and insurance authorization. Assessment / Plan / Recommendation   09/21/2021  10:13 AM Clinical Impressions Clinical Impression Pt presents with Shaquanna Lycan moderate oral dysphagia 2/2 L sided weakeness.  There was anterior loss of thin liquid x1 2/2 decreased labial seal. With simulated ground consistency there was significant oral retention of bolus on L side.  During pill simulation, pt attempted to masticate tablet mixed with puree.  Pharyngeal deficits were minimal with slightly delayed swallow noted intermittently and reduced tongue base retraction.  These deficits resulted in trace, transient, very shallow penetration of nectar thick liquid during the swallow, and trace valllecula residue.  There was no aspiration of any consistencies trialed.  PO trials of puree and thin liquid by straw were given with and without PMV in place with no penetratin or aspiration.  Pt was noted to take small sips and was placed in upright position in chair for fluoroscopy and these conditions should be continued with regular PO intake. PMV  placement is recommended and encouraged for PO intake, but is not strictly necessary.  Recommend puree diet with thin liquid by straw with aspiration precautions as noted below. SLP Visit Diagnosis Dysphagia, oropharyngeal phase (R13.12) Impact on safety and function Mild aspiration risk     09/21/2021  10:13 AM Treatment Recommendations Treatment Recommendations Therapy as outlined in treatment plan below     09/21/2021  10:19 AM Prognosis Prognosis for Safe Diet Advancement Good   09/21/2021  10:13 AM Diet Recommendations SLP Diet Recommendations Dysphagia 1 (Puree) solids;Thin liquid Liquid Administration via Straw Medication Administration Crushed with puree Compensations Minimize environmental distractions;Slow rate;Small sips/bites;Monitor for anterior loss;Follow solids with liquid to reduce oral residue; Have suction available to assist with oral clearance;Liquid by straw to reduce anterior loss;Check L side of oral cavity for pocketing Postural Changes Seated upright at 90 degrees     09/21/2021  10:13 AM Other Recommendations Oral Care Recommendations Oral care BID Other Recommendations Have oral suction available;Place PMSV during PO intake Follow Up Recommendations Acute inpatient rehab (3hours/day) Assistance recommended at discharge Frequent or constant Supervision/Assistance Functional Status Assessment Patient has had Mckenzie Toruno recent decline in their functional status and demonstrates the ability to make significant improvements in function in Lataya Varnell reasonable and predictable amount of time.   09/21/2021  10:13 AM Frequency and Duration  Speech Therapy Frequency (ACUTE ONLY) min 2x/week Treatment Duration 2 weeks     09/21/2021  10:01 AM Oral Phase Oral Phase Impaired Oral - Honey Teaspoon WFL Oral - Nectar Teaspoon WFL Oral - Nectar Straw WFL Oral - Thin Straw Right anterior bolus loss;Left anterior bolus loss Oral - Puree Lingual pumping Oral - Mech Soft Reduced posterior propulsion;Lingual pumping;Left pocketing  in lateral sulci;Holding of bolus Oral - Pill  Reduced posterior propulsion;Holding of bolus;Impaired mastication    09/21/2021  10:03 AM Pharyngeal Phase Pharyngeal- Honey Teaspoon Reduced tongue base retraction Pharyngeal Material does not enter airway Pharyngeal- Nectar Teaspoon Delayed swallow initiation-vallecula;Reduced tongue base retraction Pharyngeal Material enters airway, remains ABOVE vocal cords then ejected out Pharyngeal- Nectar Straw Delayed swallow initiation-vallecula;Reduced tongue base retraction Pharyngeal Material enters airway, remains ABOVE vocal cords then ejected out Pharyngeal- Thin Straw Delayed swallow initiation-vallecula;Reduced tongue base retraction Pharyngeal Material does not enter airway Pharyngeal- Puree Reduced tongue base retraction Pharyngeal Material does not enter airway Pharyngeal- Mechanical Soft Reduced tongue base retraction Pharyngeal Material does not enter airway Pharyngeal- Pill WFL Pharyngeal Material does not enter airway     No data to display    Celedonio Savage, MA, CCC-SLP Acute Rehabilitation Services Office: [phone] 09/21/2021, 10:31 AM                          Scheduled Meds:  amantadine  200 mg Per Tube Weekly   amLODipine  10 mg Per Tube Daily   calcitRIOL  1 mcg Per Tube Q M,W,F   carvedilol  6.25 mg Per NG tube BID WC   chlorhexidine  15 mL Mouth Rinse BID   Chlorhexidine Gluconate Cloth  6 each Topical Q0600   darbepoetin (ARANESP) injection - DIALYSIS  100 mcg Intravenous Q Fri-HD   feeding supplement (PROSource TF)  45 mL Per Tube Daily   fiber  1 packet Per Tube BID   heparin injection (subcutaneous)  5,000 Units Subcutaneous Q8H   insulin aspart  0-9 Units Subcutaneous Q4H   multivitamin  1 tablet Per Tube QHS   mouth rinse  15 mL Mouth Rinse 4 times per day   pantoprazole sodium  40 mg Per Tube Daily   scopolamine  1 patch Transdermal Q72H   sevelamer carbonate  0.8 g Per Tube TID WC   Continuous Infusions:  sodium chloride  0 mL/hr at 09/06/21 1141   feeding supplement (NEPRO CARB STEADY) Stopped (09/21/21 2030)     LOS: 30 days    Time spent: over 30 min    Fayrene Helper, MD [facility]   To contact the attending provider between 7A-7P or the covering provider during after hours 7P-7A, please log into the web site [email or web address] and access using universal [place] password for that web site. If you do not have the password, please call the hospital operator.  09/22/2021, 6:45 PM

## 2021-09-22 NOTE — Assessment & Plan Note (Signed)
noted 

## 2021-09-22 NOTE — Assessment & Plan Note (Addendum)
Now with trach collar with tracheostomy site infection.  She has finished her course of antibiotics for treatment of Enterobacter and Staph aureus, with antibiotic stop date 09/15/2021 per PCCM team.  Tracheostomy deferred to PCCM.  There is Janet Mitchell question of ongoing aspiration of tube feeds also while getting PEG tube placed she had emesis and IR physician was suspicious of aspiration during the procedure on 09/16/2021.    She had increase in her tracheostomy site secretions, chest x-ray and CT scan showed bilateral infiltrates, although she has no fever or leukocytosis but due to witnessed aspiration during PEG tube placement and radiological findings will give her 5 days of Unasyn carting on 09/17/2021 and monitor.  Clinically gradual improvement.  Secretions also improved after addition of scopolamine patch on 09/20/2021.    S/p MBS by speech, recommending dysphagia 13, thin liquids, meds crushed with puree -> continue SLP follow up outpatient  decannulated 6/28  Continue to monitor for any signs of ongoing aspiration in the future.

## 2021-09-22 NOTE — Subjective & Objective (Signed)
CCM Progress  Overnight Events   Has been on several days of trach collar with minimal suctioning requirements and had passed Modified Barium Swallow.   Studies DG Swallowing Func-Speech Pathology  Result Date: 09/21/2021 HPI: Patient is a 56 y.o. female who presented to [facility] ER as a code stroke with c/o nausea and vomiting upon waking up in the morning along with left sided weakness. She was communicative at [facility] but during stay awaiting transfer to [facility] she required emergent intubation due to inability to protect airway. 6/1 CXR showed Diffuse bilateral lung opacities may be due to pulmonary edema or multifocal pneumonia. MRI brain showed right thalamic intraparenchymal hemorrhage  with surrounding edema. She was extubated on 6/1 and although initially tolerated well, quickly was seen with poor, ineffective cough, copious secretions unable to clear independently but in no obvious distress and oxygen saturations 100% on 4L nasal cannula. She was reintubated on 6/2 to tracheostomy #6 cuffed Shiley. Pt with PMH: ESRD on HD MWF, CHF, HTN, DM.  Subjective: Pt awake, alert, pleasant, participative  Recommendations for follow up therapy are one component of a multi-disciplinary discharge planning process, led by the attending physician.  Recommendations may be updated based on patient status, additional functional criteria and insurance authorization. Assessment / Plan / Recommendation   09/21/2021  10:13 AM Clinical Impressions Clinical Impression Pt presents with a moderate oral dysphagia 2/2 L sided weakeness.  There was anterior loss of thin liquid x1 2/2 decreased labial seal. With simulated ground consistency there was significant oral retention of bolus on L side.  During pill simulation, pt attempted to masticate tablet mixed with puree.  Pharyngeal deficits were minimal with slightly delayed swallow noted intermittently and reduced tongue base retraction.  These deficits resulted in trace, transient, very  shallow penetration of nectar thick liquid during the swallow, and trace valllecula residue.  There was no aspiration of any consistencies trialed.  PO trials of puree and thin liquid by straw were given with and without PMV in place with no penetratin or aspiration.  Pt was noted to take small sips and was placed in upright position in chair for fluoroscopy and these conditions should be continued with regular PO intake. PMV placement is recommended and encouraged for PO intake, but is not strictly necessary.  Recommend puree diet with thin liquid by straw with aspiration precautions as noted below. SLP Visit Diagnosis Dysphagia, oropharyngeal phase (R13.12) Impact on safety and function Mild aspiration risk     09/21/2021  10:13 AM Treatment Recommendations Treatment Recommendations Therapy as outlined in treatment plan below     09/21/2021  10:19 AM Prognosis Prognosis for Safe Diet Advancement Good   09/21/2021  10:13 AM Diet Recommendations SLP Diet Recommendations Dysphagia 1 (Puree) solids;Thin liquid Liquid Administration via Straw Medication Administration Crushed with puree Compensations Minimize environmental distractions;Slow rate;Small sips/bites;Monitor for anterior loss;Follow solids with liquid to reduce oral residue; Have suction available to assist with oral clearance;Liquid by straw to reduce anterior loss;Check L side of oral cavity for pocketing Postural Changes Seated upright at 90 degrees     09/21/2021  10:13 AM Other Recommendations Oral Care Recommendations Oral care BID Other Recommendations Have oral suction available;Place PMSV during PO intake Follow Up Recommendations Acute inpatient rehab (3hours/day) Assistance recommended at discharge Frequent or constant Supervision/Assistance Functional Status Assessment Patient has had a recent decline in their functional status and demonstrates the ability to make significant improvements in function in a reasonable and predictable amount of time.  09/21/2021  10:13 AM Frequency and Duration  Speech Therapy Frequency (ACUTE ONLY) min 2x/week Treatment Duration 2 weeks     09/21/2021  10:01 AM Oral Phase Oral Phase Impaired Oral - Honey Teaspoon WFL Oral - Nectar Teaspoon WFL Oral - Nectar Straw WFL Oral - Thin Straw Right anterior bolus loss;Left anterior bolus loss Oral - Puree Lingual pumping Oral - Mech Soft Reduced posterior propulsion;Lingual pumping;Left pocketing in lateral sulci;Holding of bolus Oral - Pill Reduced posterior propulsion;Holding of bolus;Impaired mastication    09/21/2021  10:03 AM Pharyngeal Phase Pharyngeal- Honey Teaspoon Reduced tongue base retraction Pharyngeal Material does not enter airway Pharyngeal- Nectar Teaspoon Delayed swallow initiation-vallecula;Reduced tongue base retraction Pharyngeal Material enters airway, remains ABOVE vocal cords then ejected out Pharyngeal- Nectar Straw Delayed swallow initiation-vallecula;Reduced tongue base retraction Pharyngeal Material enters airway, remains ABOVE vocal cords then ejected out Pharyngeal- Thin Straw Delayed swallow initiation-vallecula;Reduced tongue base retraction Pharyngeal Material does not enter airway Pharyngeal- Puree Reduced tongue base retraction Pharyngeal Material does not enter airway Pharyngeal- Mechanical Soft Reduced tongue base retraction Pharyngeal Material does not enter airway Pharyngeal- Pill WFL Pharyngeal Material does not enter airway     No data to display    Celedonio Savage, MA, [place] Office: [phone] 09/21/2021, 10:31 AM                     Consults Treatment Team:  Roney Jaffe, MD Pccm, Md, MD Vallarie Mare, MD   OBJECTIVE Vital signs for last 24 hours Temp:  [97.7 F (36.5 C)-98.7 F (37.1 C)] 98 F (36.7 C) (06/28 1020) Pulse Rate:  [70-98] 73 (06/28 1430) Resp:  [12-19] 18 (06/28 1430) BP: (91-148)/(54-107) 101/54 (06/28 1430) SpO2:  [55 %-100 %] 95 % (06/28 1430) Weight:  [95.4 kg-210.7 kg]  210.7 kg (06/28 1020)   Intake/Output from previous day 06/27 0701 - 06/28 0700 In: 100 [P.O.:100] Out: -   Intake/Output this shift No intake/output data recorded.  Vent settings for last 24 hours    Physical Exam:  General: alert and no respiratory distress Neuro: alert, oriented, and nonfocal exam HEENT/Neck: trach-clean, intact Resp: clear to auscultation bilaterally CVS: regular rate and rhythm, S1, S2 normal, no murmur, click, rub or gallop   Intracranial hemorrhage (HCC) Large right thalamic bleed Patient able to follow commands on right side.  Left side plegic. Appears to be able to protect airway Awake but still has lid apraxia.  Follows commands MRI shows stable bleed and ventricular size, small increase in surrounding edema compared to admission CT.   - Tracheostomy in place for airway protection.  - Passed successful MBS.  - No suctioning requirements  - Start capping trials and decannulate if tolerates 48h.    ESRD (end stage renal disease) on dialysis [facility])  -Continue hemodialysis as per nephrology.  Kipp Brood, MD [facility] ICU Physician [place]  Pager: [phone] Or Epic Secure Chat After hours: [phone].  09/22/2021, 3:06 PM

## 2021-09-22 NOTE — Progress Notes (Signed)
PT Cancellation Note  Patient Details Name: [facility] MRN: [id number] DOB: 1965-07-01   Cancelled Treatment:    Reason Eval/Treat Not Completed: Patient at procedure or test/unavailable. Pt in HD.   [name] 09/22/2021, 10:40 AM

## 2021-09-23 DIAGNOSIS — I629 Nontraumatic intracranial hemorrhage, unspecified: Secondary | ICD-10-CM | POA: Diagnosis not present

## 2021-09-23 LAB — CBC WITH DIFFERENTIAL/PLATELET
Abs Immature Granulocytes: 0.02 10*3/uL (ref 0.00–0.07)
Basophils Absolute: 0.1 10*3/uL (ref 0.0–0.1)
Basophils Relative: 1 %
Eosinophils Absolute: 0.2 10*3/uL (ref 0.0–0.5)
Eosinophils Relative: 3 %
HCT: 31 % — ABNORMAL LOW (ref 36.0–46.0)
Hemoglobin: 10 g/dL — ABNORMAL LOW (ref 12.0–15.0)
Immature Granulocytes: 0 %
Lymphocytes Relative: 15 %
Lymphs Abs: 1.2 10*3/uL (ref 0.7–4.0)
MCH: 26 pg (ref 26.0–34.0)
MCHC: 32.3 g/dL (ref 30.0–36.0)
MCV: 80.7 fL (ref 80.0–100.0)
Monocytes Absolute: 1 10*3/uL (ref 0.1–1.0)
Monocytes Relative: 12 %
Neutro Abs: 5.5 10*3/uL (ref 1.7–7.7)
Neutrophils Relative %: 69 %
Platelets: 147 10*3/uL — ABNORMAL LOW (ref 150–400)
RBC: 3.84 MIL/uL — ABNORMAL LOW (ref 3.87–5.11)
RDW: 18.8 % — ABNORMAL HIGH (ref 11.5–15.5)
WBC: 7.9 10*3/uL (ref 4.0–10.5)
nRBC: 0 % (ref 0.0–0.2)

## 2021-09-23 LAB — GLUCOSE, CAPILLARY
Glucose-Capillary: 142 mg/dL — ABNORMAL HIGH (ref 70–99)
Glucose-Capillary: 168 mg/dL — ABNORMAL HIGH (ref 70–99)
Glucose-Capillary: 150 mg/dL — ABNORMAL HIGH (ref 70–99)
Glucose-Capillary: 203 mg/dL — ABNORMAL HIGH (ref 70–99)
Glucose-Capillary: 149 mg/dL — ABNORMAL HIGH (ref 70–99)

## 2021-09-23 LAB — COMPREHENSIVE METABOLIC PANEL
ALT: 6 U/L (ref 0–44)
AST: 23 U/L (ref 15–41)
Albumin: 3.2 g/dL — ABNORMAL LOW (ref 3.5–5.0)
Alkaline Phosphatase: 91 U/L (ref 38–126)
Anion gap: 12 (ref 5–15)
BUN: 33 mg/dL — ABNORMAL HIGH (ref 6–20)
CO2: 29 mmol/L (ref 22–32)
Calcium: 9.5 mg/dL (ref 8.9–10.3)
Chloride: 94 mmol/L — ABNORMAL LOW (ref 98–111)
Creatinine, Ser: 4.61 mg/dL — ABNORMAL HIGH (ref 0.44–1.00)
GFR, Estimated: 11 mL/min — ABNORMAL LOW (ref >60)
Glucose, Bld: 136 mg/dL — ABNORMAL HIGH (ref 70–99)
Potassium: 3.8 mmol/L (ref 3.5–5.1)
Sodium: 135 mmol/L (ref 135–145)
Total Bilirubin: 0.6 mg/dL (ref 0.3–1.2)
Total Protein: 8.1 g/dL (ref 6.5–8.1)

## 2021-09-23 LAB — VITAMIN B12: Vitamin B-12: 1131 pg/mL — ABNORMAL HIGH (ref 180–914)

## 2021-09-23 LAB — PHOSPHORUS: Phosphorus: 4.4 mg/dL (ref 2.5–4.6)

## 2021-09-23 LAB — MAGNESIUM: Magnesium: 2.1 mg/dL (ref 1.7–2.4)

## 2021-09-23 LAB — FOLATE: Folate: 33.1 ng/mL (ref >5.9)

## 2021-09-23 MED ORDER — CHLORHEXIDINE GLUCONATE CLOTH 2 % EX PADS
6.0000 | MEDICATED_PAD | Freq: Every day | CUTANEOUS | Status: DC
Start: 2021-09-24 — End: 2021-09-27
  Administered 2021-09-24 – 2021-09-26 (×3): 6 via TOPICAL

## 2021-09-23 MED ORDER — AMLODIPINE BESYLATE 2.5 MG PO TABS
2.5000 mg | ORAL_TABLET | Freq: Two times a day (BID) | ORAL | Status: DC
  Administered 2021-09-25 – 2021-09-26 (×2): 2.5 mg via ORAL
  Filled 2021-09-23 (×4): qty 1

## 2021-09-23 NOTE — Progress Notes (Signed)
[facility] Progress Note  Subjective: trach removed yesterday, pt is verbally responsive w/ good cognition. Cannot move L side much.   Vitals:   09/23/21 0347 09/23/21 0746 09/23/21 0847 09/23/21 1146  BP: (!) 113/52 119/71  116/67  Pulse: 74 76 78 77  Resp: (!) '22 16 14 14  '$ Temp: 97.7 F (36.5 C) 98.8 F (37.1 C)  98.7 F (37.1 C)  TempSrc: Oral Oral  Oral  SpO2: 98% 97% 95%   Weight:      Height:        Physical Exam:         Adult female in bed in NAD Trach removed Chest cta bilaterally; unlabored S1S2 no rub Abd soft ntnd  Ext no LE edema appreciated Neuro awake and responsive, knows hospital and interacts w/ good cognition in general Access: LUE AVF +bruit  Home meds include - asa, tylenol, renavite, cadexomer iodine, phoslo, ceterizine, cholestyramine, hydralazine 10 bid, labetalol 200 bid, losartan 100, psyllium   CXR 6/24 and 6/26 > IMPRESSION: There has been no significant interval change in diffuse interstitial/reticulonodular opacities throughout the lungs. No new finding.      OP HD: [facility] MWF  4h  97.5kg  450/500  2/2.5 bath  15ga  AVF  Hep none - mircera 100 ug q2 - rocaltrol 1.0 mcg po tiw   Assessment/ Plan Right thalamic intracranial hemorrhage - w/ intraventricular hemorrhage and hydrocephalus. Underwent ventriculostomy drain placement, then VP shunt surgery 6/14 per neurosurgery.  Resp failure - trach was dc'd yesterday 6/28.  ESRD - gets HD MWF. HD tomorrow.  HTN/volume  - is under dry wt, no vol excess on exam. Last 2 CXR's grossly clear. UF 1-2 L on average w/ declining BP's over past few days. Will lower UF goal to 1 L next HD and will lower norvasc to 2.5 bid, cont coreg 6.25 bid w/ hold orders.  Anemia CKD - got aranesp on 6/17 at 100 mcg off schedule.  Is ordered for 100 mcg weekly on Fridays.  Hb 10's. Tsat 16 but ferr 1400, will hold off on IV Fe for now.  CKD-MBD - On calcitriol. We transitioned to renvela due to  hypercalcemia while here.  Improved. Only 2.5 calcium dialysate available here.  On nepro.  Atrial fib - per primary team Disposition - if trach can be dc'd, then she could got to SNF if can sit in a chair for several hrs.    Kelly Splinter, MD 09/23/2021, 12:03 PM   Recent Labs  Lab 09/22/21 0326 09/23/21 0408  HGB 10.4* 10.0*  ALBUMIN 3.5 3.2*  CALCIUM 10.2 9.5  PHOS 6.1* 4.4  CREATININE 6.83* 4.61*  K 4.4 3.8   Recent Labs  Lab 09/22/21 0326  IRON 37  TIBC 238*  FERRITIN 1,402*     Inpatient medications:  amantadine  200 mg Per Tube Weekly   amLODipine  2.5 mg Oral BID   calcitRIOL  1 mcg Per Tube Q M,W,F   carvedilol  6.25 mg Per NG tube BID WC   chlorhexidine  15 mL Mouth Rinse BID   [START ON 09/24/2021] Chlorhexidine Gluconate Cloth  6 each Topical Q0600   darbepoetin (ARANESP) injection - DIALYSIS  100 mcg Intravenous Q Fri-HD   feeding supplement (PROSource TF)  45 mL Per Tube Daily   fiber  1 packet Per Tube BID   heparin injection (subcutaneous)  5,000 Units Subcutaneous Q8H   insulin aspart  0-9 Units Subcutaneous Q4H  multivitamin  1 tablet Per Tube QHS   mouth rinse  15 mL Mouth Rinse 4 times per day   pantoprazole sodium  40 mg Per Tube Daily   scopolamine  1 patch Transdermal Q72H   sevelamer carbonate  0.8 g Per Tube TID WC    sodium chloride 0 mL/hr at 09/06/21 1141   feeding supplement (NEPRO CARB STEADY) 1,000 mL (09/23/21 0332)   acetaminophen **OR** acetaminophen (TYLENOL) oral liquid 160 mg/5 mL **OR** acetaminophen, fentaNYL (SUBLIMAZE) injection, Gerhardt's butt cream, hydrALAZINE, ipratropium-albuterol, labetalol, ondansetron (ZOFRAN) IV, mouth rinse, polyvinyl alcohol, senna-docusate

## 2021-09-23 NOTE — Plan of Care (Signed)
  Problem: Education: Goal: Knowledge of disease or condition will improve 09/23/2021 1544 by Emmaline Life, RN Outcome: Progressing 09/23/2021 1543 by Emmaline Life, RN Outcome: Progressing Goal: Knowledge of secondary prevention will improve (SELECT ALL) Outcome: Progressing

## 2021-09-23 NOTE — Assessment & Plan Note (Addendum)
decannulated 6/28 Follow up with pulmonology prn

## 2021-09-23 NOTE — Progress Notes (Signed)
PT Cancellation Note  Patient Details Name: Janet [facility] MRN: [id number] DOB: [birth year]   Cancelled Treatment:    Reason Eval/Treat Not Completed: Patient at procedure or test/unavailable  Working with SLP. Will reattempt as schedule permits   Arby Barrette, [place]  Office [phone]   Rexanne Mano 09/23/2021, 9:48 AM

## 2021-09-23 NOTE — Progress Notes (Signed)
PROGRESS NOTE    Janet [facility]  IRW:[id number] DOB: [birth year] DOA: 08/23/2021 PCP: Janet Spar, MD  Chief Complaint  Patient presents with   Code Stroke    Brief Narrative:  Brief Narrative (HPI from H&P)  56 y.o. female with history of ESRD on HD MWF, CHF, poorly controlled HTN, DM presenting with nausea, vomiting, left side weakness present upon waking.  Scented to [facility] on 08/23/2021 with nausea vomiting left-sided weakness, work-up showed right-sided thalamic hemorrhage she was intubated and transferred to [facility] under ICU care for further treatment.  She was seen by Janet [facility], nephrology for HD, neurology and neurosurgery, external ventricular drain placement by neurosurgery on 08/23/2021.  She also was intubated, trached, during her [facility] stay she had tracheostomy site infection for which she is on antibiotic.  She has been transferred to hospitalist service on 09/13/2021 on day 20 one of her [facility] stay with plan of going to [facility] versus SNF.     Events procedures   5/28 > Presents to [facility] ED, intubated. 5/29 > Transferred to [facility] Echocardiogram with EF 60%, no acute changes. 5/30 > HD, more awake, following commands with RUE/RLE 6/1 No issues overnight, she remains alert and interactive on vent.  Extubated 6/2 failed extubation requiring reintubation. Trached. 6/4 Placed on ATC yesterday afternoon and has tolerated well since 6/5: EVD raised to 20 cm H20 6/7: Failed EVD clamping trial after developing emesis and headache 6/8: Failed EVD clamping due to emesis 6/9: Increase EVD pressure to 25 6/12 back on vent due to hypoxia from mucus plugging, started back on antibiotics 6/14 VP shunt placed. Antibiotics adjusted based on cultures 6/17-has been off the vent since 09/10/2021, did not require to be on overnight. 6/19.  Transferred to hospitalist service 09/16/2021 PEG tube placed by IR Dr. Anselm Pancoast     Assessment & Plan:   Principal Problem:    Intracranial hemorrhage (Janet [facility]) Active Problems:   Respiratory failure requiring intubation (Janet [facility])   History of tracheostomy   ESRD (end stage renal disease) on dialysis [facility])   Dysphagia   Essential hypertension   Anemia   Type 2 diabetes mellitus (HCC)   Obesity, morbid, BMI 50 or higher (Janet [facility])   Assessment and Plan: * Intracranial hemorrhage (Janet [facility]) Head CT 5/29 with 5 cc acute hematoma in R thalamus with intraventricular extension S/p external ventricular drain placement by neurosurgery on 5/29 Subsequent VP shunt placement 6/14 Hemorrhage was thought due to uncontrolled hypertension.  Brain bleed contributing to severe left-sided weakness, dysphagia along with toxic encephalopathy Was seen by neurosurgery and neurology Goal SBP <160 She'll need placement (LTACH vs SNF) S/p peg placement on 6/22  History of tracheostomy decannulated 6/28  Respiratory failure requiring intubation (Janet [facility]) Now with trach collar with tracheostomy site infection.  She has finished her course of antibiotics for treatment of Enterobacter and Staph aureus, with antibiotic stop date 09/15/2021 per PCCM team.  Tracheostomy deferred to PCCM.  There is [name] question of ongoing aspiration of tube feeds also while getting PEG tube placed she had emesis and IR physician was suspicious of aspiration during the procedure on 09/16/2021.    She had increase in her tracheostomy site secretions, chest x-ray and CT scan showed bilateral infiltrates, although she has no fever or leukocytosis but due to witnessed aspiration during PEG tube placement and radiological findings will give her 5 days of Unasyn carting on 09/17/2021 and monitor.  Clinically gradual improvement.  Secretions also improved after addition of scopolamine  patch on 09/20/2021.    S/p MBS by speech, recommending dysphagia 1, thin liquids  decannulated 6/28  Continue to monitor for any signs of ongoing aspiration in the future.   Dysphagia SLP recommending  dysphagia 1, thin liquids, meds crushed with puree S/p PEG placement  ESRD (end stage renal disease) on dialysis [facility]) nephrology following If able to sit in chair for several hours, could potentially go to SNF (per renal)  Essential hypertension Amlodipine, coreg - dose adjusted by renal Maintain BP < 160  Anemia Elevated ferritin Suspect AOCD Will check b12/folate -> b12 elevated, folate wnl  Type 2 diabetes mellitus (HCC) a1c 6.2 follow  Obesity, morbid, BMI 50 or higher (Janet [facility]) noted       DVT prophylaxis: heparin Code Status: full Family Communication: none at bedside Disposition:   Status is: Inpatient Remains inpatient appropriate because: pending placement   Consultants:  Neurology Neurosurgery PCCM IR  Procedures:  Echo IMPRESSIONS     1. Left ventricular ejection fraction, by estimation, is 60 to 65%. The  left ventricle has normal function. The left ventricle has no regional  wall motion abnormalities. There is severe left ventricular hypertrophy.  Left ventricular diastolic parameters   are consistent with Grade II diastolic dysfunction (pseudonormalization).  Elevated left atrial pressure.   2. Right ventricular systolic function is normal. The right ventricular  size is normal.   3. Left atrial size was moderately dilated.   4. The mitral valve is normal in structure. Trivial mitral valve  regurgitation. No evidence of mitral stenosis.   5. The aortic valve is tricuspid. Aortic valve regurgitation is not  visualized. Aortic valve sclerosis is present, with no evidence of aortic  valve stenosis.   6. The inferior vena cava is normal in size with greater than 50%  respiratory variability, suggesting right atrial pressure of 3 mmHg.  6/23 IMPRESSION: Successful fluoroscopic guided percutaneous gastrostomy tube placement.  5/29 external ventricular drain 6/2 trach 6/14 VP shunt placed   Antimicrobials:  Anti-infectives (From  admission, onward)    Start     Dose/Rate Route Frequency Ordered Stop   09/17/21 1545  ampicillin-sulbactam (UNASYN) 1.5 g in sodium chloride 0.9 % 100 mL IVPB        1.5 g 200 mL/hr over 30 Minutes Intravenous Every 12 hours 09/17/21 1445 09/22/21 1544   09/16/21 1543  ceFAZolin (ANCEF) IVPB 2g/100 mL premix        over 30 Minutes Intravenous Continuous PRN 09/16/21 1552 09/16/21 1543   09/16/21 0500  ceFAZolin (ANCEF) IVPB 2g/100 mL premix        2 g 200 mL/hr over 30 Minutes Intravenous To Radiology 09/15/21 1358 09/17/21 0500   09/09/21 1800  ceFEPIme (MAXIPIME) 1 g in sodium chloride 0.9 % 100 mL IVPB        1 g 200 mL/hr over 30 Minutes Intravenous Every 24 hours 09/08/21 1116 09/15/21 1759   09/08/21 1330  ceFEPIme (MAXIPIME) 1 g in sodium chloride 0.9 % 100 mL IVPB        1 g 200 mL/hr over 30 Minutes Intravenous NOW 09/08/21 1234 09/08/21 1352   09/08/21 1215  cefTRIAXone (ROCEPHIN) 1 g in sodium chloride 0.9 % 100 mL IVPB  Status:  Discontinued        1 g 200 mL/hr over 30 Minutes Intravenous  Once 09/08/21 1116 09/08/21 1234   09/08/21 1200  vancomycin (VANCOCIN) IVPB 1000 mg/200 mL premix  Status:  Discontinued  1,000 mg 200 mL/hr over 60 Minutes Intravenous Every M-W-F (Hemodialysis) 09/06/21 1518 09/07/21 0741   09/08/21 0730  ceFAZolin (ANCEF) IVPB 2g/100 mL premix        2 g 200 mL/hr over 30 Minutes Intravenous On call to O.R. 09/08/21 1610 09/08/21 0848   09/06/21 2200  vancomycin (VANCOCIN) IVPB 1000 mg/200 mL premix  Status:  Discontinued        1,000 mg 200 mL/hr over 60 Minutes Intravenous  Once 09/06/21 1518 09/06/21 2156   09/06/21 2000  piperacillin-tazobactam (ZOSYN) IVPB 2.25 g  Status:  Discontinued        2.25 g 100 mL/hr over 30 Minutes Intravenous Every 8 hours 09/06/21 1518 09/08/21 1116   09/06/21 1115  piperacillin-tazobactam (ZOSYN) IVPB 3.375 g        3.375 g 100 mL/hr over 30 Minutes Intravenous  Once 09/06/21 1023 09/06/21 1132    09/06/21 1115  vancomycin (VANCOREADY) IVPB 2000 mg/400 mL        2,000 mg 200 mL/hr over 120 Minutes Intravenous  Once 09/06/21 1024 09/06/21 1342   08/23/21 1230  piperacillin-tazobactam (ZOSYN) IVPB 2.25 g        2.25 g 100 mL/hr over 30 Minutes Intravenous Every 8 hours 08/23/21 1141 08/28/21 0523       Subjective: No new complaints  Objective: Vitals:   09/23/21 0847 09/23/21 1146 09/23/21 1304 09/23/21 1518  BP:  116/67  102/82  Pulse: 78 77 78 86  Resp: 14 14 (!) 21 16  Temp:  98.7 F (37.1 C)  98.6 F (37 C)  TempSrc:  Oral  Oral  SpO2: 95%  98% (!) 83%  Weight:      Height:        Intake/Output Summary (Last 24 hours) at 09/23/2021 1533 Last data filed at 09/23/2021 0700 Gross per 24 hour  Intake 1426 ml  Output 2000 ml  Net -574 ml   Filed Weights   09/22/21 1020 09/22/21 1607 09/23/21 0334  Weight: (!) 210.7 kg (S) (!) 204.8 kg 92.8 kg    Examination:  General: No acute distress. Cardiovascular: RRR Lungs: unlabored Abdomen: Soft, nontender, nondistended Neurological: L sided weakness Extremities: No clubbing or cyanosis. No edema.   Data Reviewed: I have personally reviewed following labs and imaging studies  CBC: Recent Labs  Lab 09/19/21 0405 09/20/21 0026 09/21/21 0800 09/22/21 0326 09/23/21 0408  WBC 6.1 6.5 6.1 7.7 7.9  NEUTROABS 3.6 4.1 3.7 5.1 5.5  HGB 9.7* 9.6* 10.4* 10.4* 10.0*  HCT 31.1* 30.0* 32.6* 33.9* 31.0*  MCV 79.3* 79.6* 80.7 80.7 80.7  PLT 151 151 158 170 147*    Basic Metabolic Panel: Recent Labs  Lab 09/19/21 0405 09/20/21 0026 09/21/21 0800 09/22/21 0326 09/23/21 0408  NA 133* 131* 136 134* 135  K 3.8 3.9 3.9 4.4 3.8  CL 91* 87* 91* 89* 94*  CO2 '28 25 28 28 29  '$ GLUCOSE 130* 174* 119* 119* 136*  BUN 66* 91* 52* 69* 33*  CREATININE 6.59* 7.98* 5.39* 6.83* 4.61*  CALCIUM 9.7 9.4 9.8 10.2 9.5  MG  --   --   --   --  2.1  PHOS 6.5* 7.5* 4.5 6.1* 4.4    GFR: Estimated Creatinine Clearance: 16.5 mL/min (Nevayah Faust)  (by C-G formula based on SCr of 4.61 mg/dL (H)).  Liver Function Tests: Recent Labs  Lab 09/19/21 0405 09/20/21 0026 09/21/21 0800 09/22/21 0326 09/23/21 0408  AST  --   --   --   --  23  ALT  --   --   --   --  6  ALKPHOS  --   --   --   --  91  BILITOT  --   --   --   --  0.6  PROT  --   --   --   --  8.1  ALBUMIN 3.1* 3.0* 3.2* 3.5 3.2*    CBG: Recent Labs  Lab 09/22/21 1947 09/22/21 2343 09/23/21 0349 09/23/21 0747 09/23/21 1145  GLUCAP 121* 156* 142* 168* 150*     No results found for this or any previous visit (from the past 240 hour(s)).       Radiology Studies: No results found.      Scheduled Meds:  amantadine  200 mg Per Tube Weekly   amLODipine  2.5 mg Oral BID   calcitRIOL  1 mcg Per Tube Q M,W,F   carvedilol  6.25 mg Per NG tube BID WC   chlorhexidine  15 mL Mouth Rinse BID   [START ON 09/24/2021] Chlorhexidine Gluconate Cloth  6 each Topical Q0600   darbepoetin (ARANESP) injection - DIALYSIS  100 mcg Intravenous Q Fri-HD   feeding supplement (PROSource TF)  45 mL Per Tube Daily   fiber  1 packet Per Tube BID   heparin injection (subcutaneous)  5,000 Units Subcutaneous Q8H   insulin aspart  0-9 Units Subcutaneous Q4H   multivitamin  1 tablet Per Tube QHS   mouth rinse  15 mL Mouth Rinse 4 times per day   pantoprazole sodium  40 mg Per Tube Daily   scopolamine  1 patch Transdermal Q72H   sevelamer carbonate  0.8 g Per Tube TID WC   Continuous Infusions:  sodium chloride 0 mL/hr at 09/06/21 1141   feeding supplement (NEPRO CARB STEADY) 1,000 mL (09/23/21 0332)     LOS: 31 days    Time spent: over 30 min    Janet Helper, MD [facility]   To contact the attending provider between 7A-7P or the covering provider during after hours 7P-7A, please log into the web site [email or web address] and access using universal [place] password for that web site. If you do not have the password, please call the [facility]  operator.  09/23/2021, 3:33 PM

## 2021-09-23 NOTE — TOC Progression Note (Signed)
Transition of Care [facility]) - Progression Note    Patient Details  Name: Janet Mitchell MRN: [id number] Date of Birth: [birth year]  Transition of Care [facility]) CM/SW [place], [place] Phone Number: 09/23/2021, 3:28 PM  Clinical Narrative:    CSW checked with [facility] on status of appeal, and there has still been no response. Noting per chart review that patient was decannulated yesterday, so can also pursue SNF placement. CSW spoke with patient's husband to discuss recommendation for SNF, and husband is in agreement, preference for placement near [place] so it's more convenient. Patient dialyzes at [facility] in [place], husband said she previously had a 6:30 am chair time. Patient has not been able to sit up yet, sent a message to PT to ask about sitting up in the chair. CSW explained to husband the next barrier will be making sure she can tolerate outpatient dialysis. CSW faxed out referral, will follow.    Expected Discharge Plan: [place] Barriers to Discharge: Continued Medical Work up, Ship broker  Expected Discharge Plan and Services Expected Discharge Plan: [place] In-house Referral: Clinical Social Work Discharge Planning Services: CM Consult   Living arrangements for the past 2 months: [place] Determinants of Health (SDOH) Interventions    Readmission Risk Interventions    07/19/2019    1:50 PM 07/12/2019    4:57 PM 04/05/2019    2:18 PM  Readmission Risk Prevention Plan  Transportation Screening Complete Complete Complete  Medication Review (RN Care Manager) Complete  Complete  PCP or Specialist appointment within 3-5 days of discharge Patient refused    [place] or [place] Complete    Palliative Care Screening Not Applicable  Not [place] Not Applicable Not Applicable Patient Refused

## 2021-09-23 NOTE — Progress Notes (Signed)
Speech Language Pathology Treatment: Dysphagia;Cognitive-Linquistic  Patient Details Name: Jakyrah Holladay Pinnix MRN: [id number] DOB: 1965-12-15 Today's Date: 09/23/2021 Time: 1937-9024 SLP Time Calculation (min) (ACUTE ONLY): 21 min  Assessment / Plan / Recommendation Clinical Impression  SWALLOWING Pt repositioned in bed as upright as possible.  Some leaning to L side, but pt was able to maintain upright posture better today than during [facility] 6/27.  Pt with immediate wet cough on first sip on thin liquid.  Pt tolerated further trials of thin liquid and puree with with infrequent, mild throat clear, and multiple swallows only. With trial of soft solid, there ws L sided pocketing and some anterior loss.  Suction was used to clear solid trial residue.  Pt it able to lateralize tongue to L, slightly past midline, marking and improvement from last week.   COMMUNICATION Pt with improved spontaneous speech with increasing length of utterance.  Pt independently expressed needs/wants today requesting her shoe/boot for her R leg. And requesting assistance with repositioning for comfort  She was decannulated 6/28 and has low vocal intensity and some breathiness.  SLP had to request some repetition.  Pt benefits from encouragement to speak loudly.  Pt completed naming task with 100% accuracy today.  Pt answered questions appropriately.  Pt would likely benefit from further assessment of cognitive-linguistic ability as language has improved significantly this week.     HPI HPI: Patient is a 56 y.o. female who presented to [facility] ER as a code stroke with c/o nausea and vomiting upon waking up in the morning along with left sided weakness. She was communicative at [facility] but during stay awaiting [facility] to [facility] she required emergent intubation due to inability to protect airway. 6/1 CXR showed Diffuse bilateral lung opacities may be due to pulmonary edema or multifocal pneumonia. MRI brain showed right thalamic intraparenchymal  hemorrhage  with surrounding edema. She was extubated on 6/1 and although initially tolerated well, quickly was seen with poor, ineffective cough, copious secretions unable to clear independently but in no obvious distress and oxygen saturations 100% on 4L nasal cannula. She was reintubated on 6/2 to tracheostomy #6 cuffed Shiley.  Decannulated 6/28. Pt with PMH: ESRD on HD MWF, CHF, HTN, DM.      SLP Plan  Continue with current plan of care      Recommendations for follow up therapy are one component of a multi-disciplinary discharge planning process, led by the attending physician.  Recommendations may be updated based on patient status, additional functional criteria and insurance authorization.    Recommendations  Diet recommendations: Dysphagia 1 (puree);Thin liquid Liquids provided via: Straw Medication Administration: Crushed with puree Supervision: Trained caregiver to feed patient Compensations: Minimize environmental distractions;Slow rate;Small sips/bites;Monitor for anterior loss;Follow solids with liquid (Check L side for oral residue. Have suction availble to help clear oral cavity as needed) Postural Changes and/or Swallow Maneuvers: Seated upright 90 degrees                Oral Care Recommendations: Oral care QID;Staff/trained caregiver to provide oral care Follow Up Recommendations: Acute inpatient rehab (3hours/day) Assistance recommended at discharge: Frequent or constant Supervision/Assistance SLP Visit Diagnosis: Dysphagia, oropharyngeal phase (R13.12) Plan: Continue with current plan of care           Celedonio Savage, [place], [place] Office: [phone] 09/23/2021, 10:12 AM

## 2021-09-23 NOTE — Progress Notes (Signed)
Physical Therapy Treatment Patient Details Name: Janet Mitchell MRN: [id number] DOB: [birth year] Today's Date: 09/23/2021   History of Present Illness 56 y.o. female presents to [facility] hospital as a transfer from [place] on 08/23/2021 with complaints of nausea, vomiting, L weakness. Pt required emergent intubation during transfer from [facility]. CTH reveals R thalamic hemorrhage with IVH. Pt underwent R frontal ventriculostomy on 5/29. Extubated 6/1, reintubated 6/2, tracheostomy 6/2. Pt failed EVD clamping and VP shunt placed 6/14. PMH includes HTN, CHF, MDII, ESRD.    PT Comments    Session focused on sitting balance and assessing her vertigo response upon moving from rt sidelying to sit. On initial sitting, she began to throw herself backward, crying out, and grabbing to hold onto PT in front of her. Indicated "yes" when asked if room was spinning. Symptoms lasted ~10 seconds. After EOB work, returned pt to rt sidelying with no nystagmus noted. None with turn to supine. Elevated to chair position, pillows placed behind upper back and performed both rt and then lt Dix-Hallpike. When in Rt Dix-Hallpike, after ~20 second delay, noted left rotating upward beating nystagmus but pt reported asymptomatic. On left Dix-Hallpike no nystagmus noted and no sense of vertigo. Unclear cause of her vertigo. Will continue to monitor.   Of note, patient not yet safe to transfer to chair with staff. Made aware by LCSW that pt will need to be able to sit up in wheelchair for transport to/from HD while at SNF. Discussed with pt's RN need to begin using mechanical lift to get pt OOB to recliner to assess her sitting tolerance (and ability to remain upright).    Recommendations for follow up therapy are one component of a multi-disciplinary discharge planning process, led by the attending physician.  Recommendations may be updated based on patient status, additional functional criteria and insurance  authorization.  Follow Up Recommendations  Skilled nursing-short term rehab (<3 hours/day) Can patient physically be transported by private vehicle: No   Assistance Recommended at Discharge Frequent or constant Supervision/Assistance  Patient can return home with the following Two people to help with walking and/or transfers;Two people to help with bathing/dressing/bathroom;Assistance with cooking/housework;Assistance with feeding;Direct supervision/assist for medications management;Direct supervision/assist for financial management;Assist for transportation;Help with stairs or ramp for entrance   Equipment Recommendations  Hospital bed;Other (comment);Wheelchair (measurements PT);Wheelchair cushion (measurements PT) (mechanical lift)    Recommendations for Other Services       Precautions / Restrictions Precautions Precautions: Fall Precaution Comments: dense L hemi, trach, VP shunt, cortrak, vertigo Restrictions Weight Bearing Restrictions: No     Mobility  Bed Mobility Overal bed mobility: Needs Assistance Bed Mobility: Rolling, Sidelying to Sit, Sit to Sidelying Rolling: Max assist, +2 for physical assistance (to her right) Sidelying to sit: Max assist, +2 for physical assistance     Sit to sidelying: Mod assist, +2 for physical assistance General bed mobility comments: once assisted onto Rt side, required max cues and +2 mod assist to move legs over EOB and push up onto Rt elbow. From elbow able to push up to sit with min assist. Return to sidelying with assist to guide torso and lift LLE onto bed    Transfers                   General transfer comment: pt still not ready for assisted standing, but ready for lifting to the chair for progression of upright sitting tolerance. Spoke to [place], Therapist, sports re: making lift OOB part of the  plan as she will need to sit up in wheelchair for transport to HD while at [facility]    Ambulation/Gait                   Stairs              Wheelchair Mobility    Modified Rankin (Stroke Patients Only) Modified Rankin (Stroke Patients Only) Pre-Morbid Rankin Score: No symptoms Modified Rankin: Severe disability     Balance Overall balance assessment: Needs assistance Sitting-balance support: No upper extremity supported, Feet supported Sitting balance-Leahy Scale: Poor Sitting balance - Comments: Worked at EOB on trunk control and finding midline. Pt with tendency to push with RUE with LOB to her left, therefore worked on not using RUE for support and being able to distinguish when she is leaning rt, lt or in midline. Postural control: Other (comment), Left lateral lean (posterior pelvic tilt.)                                  Cognition Arousal/Alertness: Awake/alert Behavior During Therapy: WFL for tasks assessed/performed Overall Cognitive Status: Difficult to assess                                 General Comments: As in last session, pt with eyes open and maintained them open throughout the session, pt able to follow all simple step commands, pt will attend to her L side with verbal cues and was able to track to left to find PT with cues.        Exercises      General Comments General comments (skin integrity, edema, etc.): VSS on monitor      Pertinent Vitals/Pain Pain Assessment Pain Assessment: No/denies pain Faces Pain Scale: No hurt Breathing: normal Negative Vocalization: none Facial Expression: smiling or inexpressive Body Language: relaxed Consolability: no need to console PAINAD Score: 0    Home Living Family/patient expects to be discharged to:: Private residence Living Arrangements: Spouse/significant other Available Help at Discharge: Family;Available 24 hours/day Type of Home: House Home Access: Stairs to enter Entrance Stairs-Rails: Left Entrance Stairs-Number of Steps: 3   Home Layout: One level Home Equipment: Conservation officer, nature (2  wheels);Shower seat;Toilet riser (spouse reports toilet riser is old and may need replacing)      Prior Function            PT Goals (current goals can now be found in the care plan section) Acute Rehab PT Goals Patient Stated Goal: none stated PT Goal Formulation: With patient Time For Goal Achievement: 10/04/21 Potential to Achieve Goals: Poor (to fair) Progress towards PT goals: Progressing toward goals    Frequency    Min 3X/week      PT Plan Current plan remains appropriate    Co-evaluation              AM-PAC PT "6 Clicks" Mobility   Outcome Measure  Help needed turning from your back to your side while in a flat bed without using bedrails?: Total Help needed moving from lying on your back to sitting on the side of a flat bed without using bedrails?: Total Help needed moving to and from a bed to a chair (including a wheelchair)?: Total Help needed standing up from a chair using your arms (e.g., wheelchair or bedside chair)?: Total Help needed to walk in hospital  room?: Total Help needed climbing 3-5 steps with a railing? : Total 6 Click Score: 6    End of Session   Activity Tolerance: Patient tolerated treatment well Patient left: in bed;with call bell/phone within reach;with bed alarm set;Other (comment) (sitting up in full chair position in the bed.) Nurse Communication: Mobility status;Need for lift equipment (dc plan now SNF and needs to be able to sit up for transport to/from HD) PT Visit Diagnosis: Other abnormalities of gait and mobility (R26.89);Other symptoms and signs involving the nervous system (R29.898);Muscle weakness (generalized) (M62.81);Hemiplegia and hemiparesis Hemiplegia - Right/Left: Left Hemiplegia - dominant/non-dominant: Non-dominant Hemiplegia - caused by: Nontraumatic intracerebral hemorrhage     Time: 3202-3343 PT Time Calculation (min) (ACUTE ONLY): 28 min  Charges:  $Neuromuscular Re-education: 23-37 mins                       Janet Mitchell, PT Acute Rehabilitation Services  Office [phone]    Janet Mitchell 09/23/2021, 3:13 PM

## 2021-09-23 NOTE — Plan of Care (Signed)
  Problem: Education: Goal: Knowledge of disease or condition will improve Outcome: Progressing Goal: Knowledge of secondary prevention will improve ([facility]) Outcome: Progressing   

## 2021-09-23 NOTE — Progress Notes (Addendum)
Contacted [facility] and spoke to Celina. Updated clinic that pt had trach removed yesterday and that pt is for possible snf placement. Will assist as needed.   Melven Sartorius Renal Navigator [phone]

## 2021-09-23 NOTE — NC FL2 (Signed)
[place] LEVEL OF CARE SCREENING TOOL     IDENTIFICATION  Patient Name: Janet Mitchell Birthdate: 1966-03-15 Sex: female Admission Date (Current Location): 08/23/2021  [facility] and [place] Number:  [employer] and Address:  The [place], [place] [address] [place], [place] [address]      Provider Number: [id number]  Attending Physician Name and Address:  Elodia Florence., *  Relative Name and Phone Number:       Current Level of Care: Hospital Recommended Level of Care: [place] Prior Approval Number:    Date Approved/Denied:   PASRR Number: [id number]  Discharge Plan: SNF    Current Diagnoses: Patient Active Problem List   Diagnosis Date Noted   History of tracheostomy 09/22/2021   Dysphagia 09/22/2021   Respiratory failure requiring intubation ([place])    Intracranial hemorrhage ([place]) 08/23/2021   Proliferative diabetic retinopathy of right eye determined by examination ([place]) 06/29/2021   Stable treated proliferative diabetic retinopathy of left eye determined by examination associated with type 2 diabetes mellitus ([place]) 06/29/2021   URI (upper respiratory infection) 09/23/2020   ESRD (end stage renal disease) on dialysis [facility])    Essential hypertension 02/14/2019   Obesity, morbid, BMI 50 or higher ([place]) 02/14/2019   Abnormal nuclear stress test 11/02/2018   Rectal bleeding 08/27/2018   Diarrhea 05/22/2018   Anemia 03/25/2018   Chronic cholecystitis with calculus 03/09/2018   Type 2 diabetes mellitus ([place]) 03/09/2018   Pre-transplant evaluation for ESRD (end stage renal disease) 12/19/2017   Dependence on renal dialysis ([place]) 11/06/2017   Diabetic retinopathy ([place]) 01/23/2016   End stage renal disease on dialysis due to type 2 diabetes mellitus ([place]) 01/23/2016   Renal osteodystrophy 01/23/2016   Diabetic macular edema ([place]) 04/10/2015   Edema of lower extremity 04/10/2015   Paresthesia of  lower extremity 04/10/2015   Recurrent falls 04/10/2015    Orientation RESPIRATION BLADDER Height & Weight     Self, Place  Normal  (anuria) Weight: 204 lb 8 oz (92.8 kg) Height:  '5\' 7"'$  (170.2 cm)  BEHAVIORAL SYMPTOMS/MOOD NEUROLOGICAL BOWEL NUTRITION STATUS      Incontinent Feeding tube (Nepro carb steady, 50 mL/hr)  AMBULATORY STATUS COMMUNICATION OF NEEDS Skin   Extensive Assist Verbally Surgical wounds, Skin abrasions (closed incisions on abdomen and right head, skin tear right elbow, open wound left great toe, no dressing)                       Personal Care Assistance Level of Assistance  Bathing, Feeding, Dressing Bathing Assistance: Maximum assistance Feeding assistance: Maximum assistance Dressing Assistance: Maximum assistance     Functional Limitations Info  Sight, Speech Sight Info: Impaired   Speech Info: Impaired (incomprehensible, dysarthria, delayed responses)    SPECIAL CARE FACTORS FREQUENCY  PT (By licensed PT), OT (By licensed OT), Speech therapy     PT Frequency: 5x/wk OT Frequency: 5x/wk     Speech Therapy Frequency: 5x/wk      Contractures Contractures Info: Not present    Additional Factors Info  Code Status, Allergies, Insulin Sliding Scale Code Status Info: Full Allergies Info: NKA   Insulin Sliding Scale Info: see DC summary       Current Medications (09/23/2021):  This is the current hospital active medication list Current Facility-Administered Medications  Medication Dose Route Frequency Provider Last Rate Last Admin   0.9 %  sodium chloride infusion  250 mL Intravenous Continuous  Noemi Chapel P, DO 0 mL/hr at 09/06/21 1141 New Bag at 09/08/21 1415   acetaminophen (TYLENOL) tablet 650 mg  650 mg Oral Q4H PRN Amie Portland, MD   650 mg at 09/21/21 9163   Or   acetaminophen (TYLENOL) 160 MG/5ML solution 650 mg  650 mg Per Tube Q4H PRN Amie Portland, MD   650 mg at 09/22/21 2126   Or   acetaminophen (TYLENOL) suppository 650 mg   650 mg Rectal Q4H PRN Amie Portland, MD   650 mg at 08/27/21 0101   amantadine (SYMMETREL) 50 MG/5ML solution 200 mg  200 mg Per Tube Weekly Agarwala, Einar Grad, MD   200 mg at 09/20/21 1337   amLODipine (NORVASC) tablet 2.5 mg  2.5 mg Oral BID Roney Jaffe, MD       calcitRIOL (ROCALTROL) capsule 1 mcg  1 mcg Per Abe People M,W,F Roney Jaffe, MD   1 mcg at 09/20/21 0857   carvedilol (COREG) tablet 6.25 mg  6.25 mg Per NG tube BID WC Roney Jaffe, MD   6.25 mg at 09/23/21 0850   chlorhexidine (PERIDEX) 0.12 % solution 15 mL  15 mL Mouth Rinse BID Gwinda Maine, MD   15 mL at 09/23/21 0851   [START ON 09/24/2021] Chlorhexidine Gluconate Cloth 2 % PADS 6 each  6 each Topical Q0600 Roney Jaffe, MD       Darbepoetin Alfa (ARANESP) injection 100 mcg  100 mcg Intravenous Q Asencion Partridge, MD   100 mcg at 09/17/21 1047   feeding supplement (NEPRO CARB STEADY) liquid 1,000 mL  1,000 mL Per Tube Continuous Thurnell Lose, MD 50 mL/hr at 09/23/21 0332 1,000 mL at 09/23/21 0332   feeding supplement (PROSource TF) liquid 45 mL  45 mL Per Tube Daily Thurnell Lose, MD   45 mL at 09/23/21 0852   fentaNYL (SUBLIMAZE) injection 25 mcg  25 mcg Intravenous Q2H PRN Gerald Leitz D, NP   25 mcg at 09/11/21 0533   fiber (NUTRISOURCE FIBER) 1 packet  1 packet Per Tube BID Kipp Brood, MD   1 packet at 09/23/21 8466   Gerhardt's butt cream   Topical PRN Kipp Brood, MD   1 application  at 59/93/57 2335   heparin injection 5,000 Units  5,000 Units Subcutaneous Q8H Lura Em, PA   5,000 Units at 09/23/21 0177   hydrALAZINE (APRESOLINE) injection 10 mg  10 mg Intravenous Q6H PRN Thurnell Lose, MD       insulin aspart (novoLOG) injection 0-9 Units  0-9 Units Subcutaneous Q4H Kerney Elbe, MD   2 Units at 09/23/21 0851   ipratropium-albuterol (DUONEB) 0.5-2.5 (3) MG/3ML nebulizer solution 3 mL  3 mL Nebulization Q6H PRN Roney Jaffe, MD       labetalol (NORMODYNE) injection 10 mg   10 mg Intravenous Q10 min PRN Gerald Leitz D, NP   10 mg at 09/13/21 1321   multivitamin (RENA-VIT) tablet 1 tablet  1 tablet Per Tube QHS Roney Jaffe, MD   1 tablet at 09/22/21 2126   ondansetron (ZOFRAN) injection 4 mg  4 mg Intravenous Q8H PRN Liz Beach, RPH       Oral care mouth rinse  15 mL Mouth Rinse 4 times per day Thurnell Lose, MD   15 mL at 09/23/21 9390   Oral care mouth rinse  15 mL Mouth Rinse PRN Thurnell Lose, MD       pantoprazole sodium (PROTONIX) 40 mg/20 mL oral suspension  40 mg  40 mg Per Tube Daily Gerald Leitz D, NP   40 mg at 09/23/21 0851   polyvinyl alcohol (LIQUIFILM TEARS) 1.4 % ophthalmic solution 1 drop  1 drop Both Eyes PRN Janine Ores, NP       scopolamine (TRANSDERM-SCOP) 1 MG/3DAYS 1.5 mg  1 patch Transdermal Q72H Thurnell Lose, MD   1.5 mg at 09/23/21 0853   senna-docusate (Senokot-S) tablet 1 tablet  1 tablet Per Tube BID PRN Kipp Brood, MD   1 tablet at 09/22/21 2127   sevelamer carbonate (RENVELA) packet 0.8 g  0.8 g Per Tube TID WC Skeet Simmer, RPH   0.8 g at 09/23/21 5784     Discharge Medications: Please see discharge summary for a list of discharge medications.  Relevant Imaging Results:  Relevant Lab Results:   Additional Information SS#: [id number]; HD at [facility], 6:30 am chair time  Geralynn Ochs, [place]

## 2021-09-24 DIAGNOSIS — I629 Nontraumatic intracranial hemorrhage, unspecified: Secondary | ICD-10-CM | POA: Diagnosis not present

## 2021-09-24 LAB — COMPREHENSIVE METABOLIC PANEL
ALT: 7 U/L (ref 0–44)
AST: 22 U/L (ref 15–41)
Albumin: 3.1 g/dL — ABNORMAL LOW (ref 3.5–5.0)
Alkaline Phosphatase: 85 U/L (ref 38–126)
Anion gap: 18 — ABNORMAL HIGH (ref 5–15)
BUN: 55 mg/dL — ABNORMAL HIGH (ref 6–20)
CO2: 28 mmol/L (ref 22–32)
Calcium: 10 mg/dL (ref 8.9–10.3)
Chloride: 88 mmol/L — ABNORMAL LOW (ref 98–111)
Creatinine, Ser: 6.19 mg/dL — ABNORMAL HIGH (ref 0.44–1.00)
GFR, Estimated: 8 mL/min — ABNORMAL LOW (ref >60)
Glucose, Bld: 154 mg/dL — ABNORMAL HIGH (ref 70–99)
Potassium: 3.9 mmol/L (ref 3.5–5.1)
Sodium: 134 mmol/L — ABNORMAL LOW (ref 135–145)
Total Bilirubin: 0.5 mg/dL (ref 0.3–1.2)
Total Protein: 7.8 g/dL (ref 6.5–8.1)

## 2021-09-24 LAB — GLUCOSE, CAPILLARY
Glucose-Capillary: 161 mg/dL — ABNORMAL HIGH (ref 70–99)
Glucose-Capillary: 168 mg/dL — ABNORMAL HIGH (ref 70–99)
Glucose-Capillary: 170 mg/dL — ABNORMAL HIGH (ref 70–99)
Glucose-Capillary: 176 mg/dL — ABNORMAL HIGH (ref 70–99)
Glucose-Capillary: 176 mg/dL — ABNORMAL HIGH (ref 70–99)
Glucose-Capillary: 178 mg/dL — ABNORMAL HIGH (ref 70–99)
Glucose-Capillary: 187 mg/dL — ABNORMAL HIGH (ref 70–99)
Glucose-Capillary: 206 mg/dL — ABNORMAL HIGH (ref 70–99)

## 2021-09-24 LAB — CBC WITH DIFFERENTIAL/PLATELET
Abs Immature Granulocytes: 0.02 10*3/uL (ref 0.00–0.07)
Basophils Absolute: 0.1 10*3/uL (ref 0.0–0.1)
Basophils Relative: 1 %
Eosinophils Absolute: 0.3 10*3/uL (ref 0.0–0.5)
Eosinophils Relative: 5 %
HCT: 32.2 % — ABNORMAL LOW (ref 36.0–46.0)
Hemoglobin: 10.3 g/dL — ABNORMAL LOW (ref 12.0–15.0)
Immature Granulocytes: 0 %
Lymphocytes Relative: 21 %
Lymphs Abs: 1.2 10*3/uL (ref 0.7–4.0)
MCH: 25.8 pg — ABNORMAL LOW (ref 26.0–34.0)
MCHC: 32 g/dL (ref 30.0–36.0)
MCV: 80.7 fL (ref 80.0–100.0)
Monocytes Absolute: 0.8 10*3/uL (ref 0.1–1.0)
Monocytes Relative: 14 %
Neutro Abs: 3.3 10*3/uL (ref 1.7–7.7)
Neutrophils Relative %: 59 %
Platelets: 140 10*3/uL — ABNORMAL LOW (ref 150–400)
RBC: 3.99 MIL/uL (ref 3.87–5.11)
RDW: 18.6 % — ABNORMAL HIGH (ref 11.5–15.5)
WBC: 5.8 10*3/uL (ref 4.0–10.5)
nRBC: 0 % (ref 0.0–0.2)

## 2021-09-24 LAB — PHOSPHORUS: Phosphorus: 5.6 mg/dL — ABNORMAL HIGH (ref 2.5–4.6)

## 2021-09-24 LAB — MAGNESIUM: Magnesium: 2.3 mg/dL (ref 1.7–2.4)

## 2021-09-24 MED ORDER — HYDRALAZINE HCL 20 MG/ML IJ SOLN: 10.0000 mg | Freq: Four times a day (QID) | INTRAMUSCULAR | Status: DC | PRN

## 2021-09-24 NOTE — TOC Progression Note (Signed)
Transition of Care [facility]) - Progression Note    Patient Details  Name: Janet Mitchell MRN: [id number] Date of Birth: 1965-09-24  Transition of Care [facility]) CM/SW [place], [place] Phone Number: 09/24/2021, 4:06 PM  Clinical Narrative:   CSW notified by [facility] liaison that patient won the appeal for LTAC, but there is not a bed available at this time, hopeful that they may have a bed become available within the timeframe to admit. CSW updated MD and will continue to follow. If patient is unable to admit to [facility], discussed with renal navigator today about need to work towards patient sitting up in the chair for HD to tolerate outpatient dialysis if needs SNF placement. CSW to follow.    Expected Discharge Plan: [place] Barriers to Discharge: Continued Medical Work up, Ship broker  Expected Discharge Plan and Services Expected Discharge Plan: [place] In-house Referral: Clinical Social Work Discharge Planning Services: CM Consult   Living arrangements for the past 2 months: [place] Determinants of Health (SDOH) Interventions    Readmission Risk Interventions    07/19/2019    1:50 PM 07/12/2019    4:57 PM 04/05/2019    2:18 PM  Readmission Risk Prevention Plan  Transportation Screening Complete Complete Complete  Medication Review (RN Care Manager) Complete  Complete  PCP or Specialist appointment within 3-5 days of discharge Patient refused    [place] or [place] Complete    Palliative Care Screening Not Applicable  Not [place] Not Applicable Not Applicable Patient Refused

## 2021-09-24 NOTE — Plan of Care (Signed)
  Problem: Education: Goal: Knowledge of disease or condition will improve Outcome: Progressing   

## 2021-09-24 NOTE — Progress Notes (Signed)
[facility] Progress Note  Subjective: seen in HD, oriented x 3, seems to prefer yes/ no questions  Vitals:   09/24/21 0759 09/24/21 0934 09/24/21 0936 09/24/21 0950  BP: 134/66 (!) 122/53  (!) 106/22  Pulse: 73 72    Resp: 20 17    Temp: 98.6 F (37 C) 97.6 F (36.4 C)    TempSrc: Oral Oral    SpO2: 94%     Weight:   95.6 kg   Height:        Physical Exam:         Adult female in bed in NAD Trach removed Chest cta bilaterally; unlabored S1S2 no rub Abd soft ntnd  Ext no LE edema appreciated Neuro awake and responds appropriately, L hemiparesis Access: LUE AVF +bruit  Home meds include - asa, tylenol, renavite, cadexomer iodine, phoslo, ceterizine, cholestyramine, hydralazine 10 bid, labetalol 200 bid, losartan 100, psyllium   CXR 6/24 and 6/26 > IMPRESSION: There has been no significant interval change in diffuse interstitial/reticulonodular opacities throughout the lungs. No new finding.      OP HD: [facility] MWF  4h  97.5kg  450/500  2/2.5 bath  15ga  AVF  Hep none - mircera 100 ug q2 - rocaltrol 1.0 mcg po tiw   Assessment/ Plan Right thalamic intracranial hemorrhage - w/ intraventricular hemorrhage and hydrocephalus. Underwent ventriculostomy drain placement, then VP shunt surgery 6/14 per neurosurgery.  Resp failure - trach removed 6/28.  ESRD - gets HD MWF. HD today HTN/volume  - is under dry wt, no vol excess on exam. Getting tube feeds. Euvolemic. Lowered norvasc to 2.5 bid, also getting coreg 6.25 bid. BP's stable.  Anemia CKD - got aranesp on 6/17 at 100 mcg off schedule.  Is ordered for 100 mcg weekly on Fridays.  Hb 10's. Tsat 16 but ferr 1400, will hold off on IV Fe for now.  CKD-MBD - On calcitriol. We transitioned to renvela due to hypercalcemia while here.  Improved. Only 2.5 calcium dialysate available here.  On nepro.  Atrial fib - per primary team Disposition - trach was dc'd, she could be dc'd to SNF if can sit in a chair for  several hrs.    Kelly Splinter, MD 09/24/2021, 10:43 AM   Recent Labs  Lab 09/23/21 0408 09/24/21 0219  HGB 10.0* 10.3*  ALBUMIN 3.2* 3.1*  CALCIUM 9.5 10.0  PHOS 4.4 5.6*  CREATININE 4.61* 6.19*  K 3.8 3.9    Recent Labs  Lab 09/22/21 0326  IRON 37  TIBC 238*  FERRITIN 1,402*      Inpatient medications:  amantadine  200 mg Per Tube Weekly   amLODipine  2.5 mg Oral BID   calcitRIOL  1 mcg Per Tube Q M,W,F   carvedilol  6.25 mg Per NG tube BID WC   chlorhexidine  15 mL Mouth Rinse BID   Chlorhexidine Gluconate Cloth  6 each Topical Q0600   darbepoetin (ARANESP) injection - DIALYSIS  100 mcg Intravenous Q Fri-HD   feeding supplement (PROSource TF)  45 mL Per Tube Daily   fiber  1 packet Per Tube BID   heparin injection (subcutaneous)  5,000 Units Subcutaneous Q8H   insulin aspart  0-9 Units Subcutaneous Q4H   multivitamin  1 tablet Per Tube QHS   mouth rinse  15 mL Mouth Rinse 4 times per day   pantoprazole sodium  40 mg Per Tube Daily   scopolamine  1 patch Transdermal Q72H   sevelamer carbonate  0.8 g Per Tube TID WC    sodium chloride 0 mL/hr at 09/06/21 1141   feeding supplement (NEPRO CARB STEADY) 1,000 mL (09/24/21 0535)   acetaminophen **OR** acetaminophen (TYLENOL) oral liquid 160 mg/5 mL **OR** acetaminophen, fentaNYL (SUBLIMAZE) injection, Gerhardt's butt cream, hydrALAZINE, ipratropium-albuterol, labetalol, ondansetron (ZOFRAN) IV, mouth rinse, polyvinyl alcohol, senna-docusate

## 2021-09-24 NOTE — Progress Notes (Signed)
PROGRESS NOTE    Janet Mitchell  LKG:[id number] DOB: [birth year] DOA: 08/23/2021 PCP: Janet Spar, MD  Chief Complaint  Patient presents with   Code Stroke    Brief Narrative:  Brief Narrative (HPI from H&P)  56 y.o. female with history of ESRD on HD MWF, CHF, poorly controlled HTN, DM presenting with nausea, vomiting, left side weakness present upon waking.  Scented to [facility] on 08/23/2021 with nausea vomiting left-sided weakness, work-up showed right-sided thalamic hemorrhage she was intubated and transferred to [facility] under ICU care for further treatment.  She was seen by [facility], nephrology for HD, neurology and neurosurgery, external ventricular drain placement by neurosurgery on 08/23/2021.  She also was intubated, trached, during her hospital stay she had tracheostomy site infection for which she is on antibiotic.  She has been transferred to hospitalist service on 09/13/2021 on day 20 one of her hospital stay with plan of going to [facility] versus SNF.     Events procedures   5/28 > Presents to [facility] ED, intubated. 5/29 > Transferred to [facility] Echocardiogram with EF 60%, no acute changes. 5/30 > HD, more awake, following commands with RUE/RLE 6/1 No issues overnight, she remains alert and interactive on vent.  Extubated 6/2 failed extubation requiring reintubation. Trached. 6/4 Placed on ATC yesterday afternoon and has tolerated well since 6/5: EVD raised to 20 cm H20 6/7: Failed EVD clamping trial after developing emesis and headache 6/8: Failed EVD clamping due to emesis 6/9: Increase EVD pressure to 25 6/12 back on vent due to hypoxia from mucus plugging, started back on antibiotics 6/14 VP shunt placed. Antibiotics adjusted based on cultures 6/17-has been off the vent since 09/10/2021, did not require to be on overnight. 6/19.  Transferred to hospitalist service 09/16/2021 PEG tube placed by IR Dr. Anselm Pancoast     Assessment & Plan:   Principal Problem:    Intracranial hemorrhage ([place]) Active Problems:   Respiratory failure requiring intubation ([place])   History of tracheostomy   ESRD (end stage renal disease) on dialysis [facility])   Dysphagia   Essential hypertension   Anemia   Type 2 diabetes mellitus (HCC)   Obesity, morbid, BMI 50 or higher ([place])   Assessment and Plan: * Intracranial hemorrhage ([place]) Head CT 5/29 with 5 cc acute hematoma in R thalamus with intraventricular extension S/p external ventricular drain placement by neurosurgery on 5/29 Subsequent VP shunt placement 6/14 Hemorrhage was thought due to uncontrolled hypertension.  Brain bleed contributing to severe left-sided weakness, dysphagia along with toxic encephalopathy Was seen by neurosurgery and neurology Goal SBP <160 She'll need placement (LTACH when bed available)  S/p peg placement on 6/22  History of tracheostomy decannulated 6/28  Respiratory failure requiring intubation ([place]) Now with trach collar with tracheostomy site infection.  She has finished her course of antibiotics for treatment of Enterobacter and Staph aureus, with antibiotic stop date 09/15/2021 per PCCM team.  Tracheostomy deferred to PCCM.  There is Janet Mitchell question of ongoing aspiration of tube feeds also while getting PEG tube placed she had emesis and IR physician was suspicious of aspiration during the procedure on 09/16/2021.    She had increase in her tracheostomy site secretions, chest x-ray and CT scan showed bilateral infiltrates, although she has no fever or leukocytosis but due to witnessed aspiration during PEG tube placement and radiological findings will give her 5 days of Unasyn carting on 09/17/2021 and monitor.  Clinically gradual improvement.  Secretions also improved after addition  of scopolamine patch on 09/20/2021.    S/p MBS by speech, recommending dysphagia 1, thin liquids  decannulated 6/28  Continue to monitor for any signs of ongoing aspiration in the future.   Dysphagia SLP  recommending dysphagia 1, thin liquids, meds crushed with puree S/p PEG placement  ESRD (end stage renal disease) on dialysis [facility]) nephrology following If able to sit in chair for several hours, could potentially go to SNF (per renal)  Essential hypertension Amlodipine, coreg - dose adjusted by renal Maintain BP < 160  Anemia Elevated ferritin Suspect AOCD Will check b12/folate -> b12 elevated, folate wnl  Type 2 diabetes mellitus (HCC) a1c 6.2 follow  Obesity, morbid, BMI 50 or higher ([place]) noted       DVT prophylaxis: heparin Code Status: full Family Communication: none at bedside Disposition:   Status is: Inpatient Remains inpatient appropriate because: pending placement   Consultants:  Neurology Neurosurgery PCCM IR  Procedures:  Echo IMPRESSIONS     1. Left ventricular ejection fraction, by estimation, is 60 to 65%. The  left ventricle has normal function. The left ventricle has no regional  wall motion abnormalities. There is severe left ventricular hypertrophy.  Left ventricular diastolic parameters   are consistent with Grade II diastolic dysfunction (pseudonormalization).  Elevated left atrial pressure.   2. Right ventricular systolic function is normal. The right ventricular  size is normal.   3. Left atrial size was moderately dilated.   4. The mitral valve is normal in structure. Trivial mitral valve  regurgitation. No evidence of mitral stenosis.   5. The aortic valve is tricuspid. Aortic valve regurgitation is not  visualized. Aortic valve sclerosis is present, with no evidence of aortic  valve stenosis.   6. The inferior vena cava is normal in size with greater than 50%  respiratory variability, suggesting right atrial pressure of 3 mmHg.  6/23 IMPRESSION: Successful fluoroscopic guided percutaneous gastrostomy tube placement.  5/29 external ventricular drain 6/2 trach 6/14 VP shunt placed   Antimicrobials:  Anti-infectives  (From admission, onward)    Start     Dose/Rate Route Frequency Ordered Stop   09/17/21 1545  ampicillin-sulbactam (UNASYN) 1.5 g in sodium chloride 0.9 % 100 mL IVPB        1.5 g 200 mL/hr over 30 Minutes Intravenous Every 12 hours 09/17/21 1445 09/22/21 1544   09/16/21 1543  ceFAZolin (ANCEF) IVPB 2g/100 mL premix        over 30 Minutes Intravenous Continuous PRN 09/16/21 1552 09/16/21 1543   09/16/21 0500  ceFAZolin (ANCEF) IVPB 2g/100 mL premix        2 g 200 mL/hr over 30 Minutes Intravenous To Radiology 09/15/21 1358 09/17/21 0500   09/09/21 1800  ceFEPIme (MAXIPIME) 1 g in sodium chloride 0.9 % 100 mL IVPB        1 g 200 mL/hr over 30 Minutes Intravenous Every 24 hours 09/08/21 1116 09/15/21 1759   09/08/21 1330  ceFEPIme (MAXIPIME) 1 g in sodium chloride 0.9 % 100 mL IVPB        1 g 200 mL/hr over 30 Minutes Intravenous NOW 09/08/21 1234 09/08/21 1352   09/08/21 1215  cefTRIAXone (ROCEPHIN) 1 g in sodium chloride 0.9 % 100 mL IVPB  Status:  Discontinued        1 g 200 mL/hr over 30 Minutes Intravenous  Once 09/08/21 1116 09/08/21 1234   09/08/21 1200  vancomycin (VANCOCIN) IVPB 1000 mg/200 mL premix  Status:  Discontinued  1,000 mg 200 mL/hr over 60 Minutes Intravenous Every M-W-F (Hemodialysis) 09/06/21 1518 09/07/21 0741   09/08/21 0730  ceFAZolin (ANCEF) IVPB 2g/100 mL premix        2 g 200 mL/hr over 30 Minutes Intravenous On call to O.R. 09/08/21 7782 09/08/21 0848   09/06/21 2200  vancomycin (VANCOCIN) IVPB 1000 mg/200 mL premix  Status:  Discontinued        1,000 mg 200 mL/hr over 60 Minutes Intravenous  Once 09/06/21 1518 09/06/21 2156   09/06/21 2000  piperacillin-tazobactam (ZOSYN) IVPB 2.25 g  Status:  Discontinued        2.25 g 100 mL/hr over 30 Minutes Intravenous Every 8 hours 09/06/21 1518 09/08/21 1116   09/06/21 1115  piperacillin-tazobactam (ZOSYN) IVPB 3.375 g        3.375 g 100 mL/hr over 30 Minutes Intravenous  Once 09/06/21 1023 09/06/21 1132    09/06/21 1115  vancomycin (VANCOREADY) IVPB 2000 mg/400 mL        2,000 mg 200 mL/hr over 120 Minutes Intravenous  Once 09/06/21 1024 09/06/21 1342   08/23/21 1230  piperacillin-tazobactam (ZOSYN) IVPB 2.25 g        2.25 g 100 mL/hr over 30 Minutes Intravenous Every 8 hours 08/23/21 1141 08/28/21 0523       Subjective: Sleeping after dialysis   Objective: Vitals:   09/24/21 1408 09/24/21 1415 09/24/21 1508 09/24/21 1523  BP: (!) 87/52 (!) 94/55 93/77   Pulse: 77 78 78   Resp: '19 18 20   '$ Temp: 98.6 F (37 C)  97.8 F (36.6 C)   TempSrc: Oral  Oral   SpO2:   98%   Weight:    95.1 kg  Height:        Intake/Output Summary (Last 24 hours) at 09/24/2021 1747 Last data filed at 09/24/2021 1541 Gross per 24 hour  Intake 1197.5 ml  Output --  Net 1197.5 ml   Filed Weights   09/23/21 0334 09/24/21 0936 09/24/21 1523  Weight: 92.8 kg 95.6 kg 95.1 kg    Examination:  General: No acute distress. Seen after dialysis, she was sleeping Cardiovascular:RRR Lungs: unlabored Neurological: sleeping Extremities: No clubbing or cyanosis. No edema.   Data Reviewed: I have personally reviewed following labs and imaging studies  CBC: Recent Labs  Lab 09/20/21 0026 09/21/21 0800 09/22/21 0326 09/23/21 0408 09/24/21 0219  WBC 6.5 6.1 7.7 7.9 5.8  NEUTROABS 4.1 3.7 5.1 5.5 3.3  HGB 9.6* 10.4* 10.4* 10.0* 10.3*  HCT 30.0* 32.6* 33.9* 31.0* 32.2*  MCV 79.6* 80.7 80.7 80.7 80.7  PLT 151 158 170 147* 140*    Basic Metabolic Panel: Recent Labs  Lab 09/20/21 0026 09/21/21 0800 09/22/21 0326 09/23/21 0408 09/24/21 0219  NA 131* 136 134* 135 134*  K 3.9 3.9 4.4 3.8 3.9  CL 87* 91* 89* 94* 88*  CO2 '25 28 28 29 28  '$ GLUCOSE 174* 119* 119* 136* 154*  BUN 91* 52* 69* 33* 55*  CREATININE 7.98* 5.39* 6.83* 4.61* 6.19*  CALCIUM 9.4 9.8 10.2 9.5 10.0  MG  --   --   --  2.1 2.3  PHOS 7.5* 4.5 6.1* 4.4 5.6*    GFR: Estimated Creatinine Clearance: 12.4 mL/min (Brenner Visconti) (by C-G formula  based on SCr of 6.19 mg/dL (H)).  Liver Function Tests: Recent Labs  Lab 09/20/21 0026 09/21/21 0800 09/22/21 0326 09/23/21 0408 09/24/21 0219  AST  --   --   --  23 22  ALT  --   --   --  6 7  ALKPHOS  --   --   --  91 85  BILITOT  --   --   --  0.6 0.5  PROT  --   --   --  8.1 7.8  ALBUMIN 3.0* 3.2* 3.5 3.2* 3.1*    CBG: Recent Labs  Lab 09/24/21 0106 09/24/21 0334 09/24/21 0525 09/24/21 0756 09/24/21 1506  GLUCAP 187* 176* 206* 170* 161*     No results found for this or any previous visit (from the past 240 hour(s)).       Radiology Studies: No results found.      Scheduled Meds:  amantadine  200 mg Per Tube Weekly   amLODipine  2.5 mg Oral BID   calcitRIOL  1 mcg Per Tube Q M,W,F   carvedilol  6.25 mg Per NG tube BID WC   chlorhexidine  15 mL Mouth Rinse BID   Chlorhexidine Gluconate Cloth  6 each Topical Q0600   darbepoetin (ARANESP) injection - DIALYSIS  100 mcg Intravenous Q Fri-HD   feeding supplement (PROSource TF)  45 mL Per Tube Daily   fiber  1 packet Per Tube BID   heparin injection (subcutaneous)  5,000 Units Subcutaneous Q8H   insulin aspart  0-9 Units Subcutaneous Q4H   multivitamin  1 tablet Per Tube QHS   mouth rinse  15 mL Mouth Rinse 4 times per day   pantoprazole sodium  40 mg Per Tube Daily   scopolamine  1 patch Transdermal Q72H   sevelamer carbonate  0.8 g Per Tube TID WC   Continuous Infusions:  sodium chloride 0 mL/hr at 09/06/21 1141   feeding supplement (NEPRO CARB STEADY) 1,000 mL (09/24/21 0535)     LOS: 32 days    Time spent: over 30 min    Fayrene Helper, MD [facility]   To contact the attending provider between 7A-7P or the covering provider during after hours 7P-7A, please log into the web site [email or web address] and access using universal Friend[place] password for that web site. If you do not have the password, please call the hospital operator.  09/24/2021, 5:47 PM

## 2021-09-24 NOTE — Progress Notes (Signed)
Received patient in bed, alert and oriented. Informed consent signed and in chart.  Time tx completed:  HD treatment completed. Patient tolerated well. Fistula without signs and symptoms of complications. Patient transported back to the room, alert and orient and in no acute distress. Report given to bedside RN.  Total UF removed:500 mls  Medication given: Calcitriol 1 mcg, Aranesp 100 mcg  Post HD VS:see chart  Post HD weight: 95.0 kg  Pt completed prescribed tx with BP fluctuations to the lower 90's to mid 31'P systolic.  Saline bolus totaling 200 mls given as an intervention.  Pt moving her arm so accurate BP's were difficult to obtain.  Pt departed in stable condition.  Alert and  oriented to person.

## 2021-09-25 DIAGNOSIS — I629 Nontraumatic intracranial hemorrhage, unspecified: Secondary | ICD-10-CM | POA: Diagnosis not present

## 2021-09-25 LAB — GLUCOSE, CAPILLARY
Glucose-Capillary: 165 mg/dL — ABNORMAL HIGH (ref 70–99)
Glucose-Capillary: 146 mg/dL — ABNORMAL HIGH (ref 70–99)
Glucose-Capillary: 158 mg/dL — ABNORMAL HIGH (ref 70–99)
Glucose-Capillary: 168 mg/dL — ABNORMAL HIGH (ref 70–99)
Glucose-Capillary: 203 mg/dL — ABNORMAL HIGH (ref 70–99)

## 2021-09-25 LAB — CBC WITH DIFFERENTIAL/PLATELET
Abs Immature Granulocytes: 0.03 10*3/uL (ref 0.00–0.07)
Basophils Absolute: 0.1 10*3/uL (ref 0.0–0.1)
Basophils Relative: 1 %
Eosinophils Absolute: 0.3 10*3/uL (ref 0.0–0.5)
Eosinophils Relative: 5 %
HCT: 31.2 % — ABNORMAL LOW (ref 36.0–46.0)
Hemoglobin: 9.6 g/dL — ABNORMAL LOW (ref 12.0–15.0)
Immature Granulocytes: 1 %
Lymphocytes Relative: 25 %
Lymphs Abs: 1.3 10*3/uL (ref 0.7–4.0)
MCH: 24.7 pg — ABNORMAL LOW (ref 26.0–34.0)
MCHC: 30.8 g/dL (ref 30.0–36.0)
MCV: 80.2 fL (ref 80.0–100.0)
Monocytes Absolute: 0.8 10*3/uL (ref 0.1–1.0)
Monocytes Relative: 16 %
Neutro Abs: 2.7 10*3/uL (ref 1.7–7.7)
Neutrophils Relative %: 52 %
Platelets: 126 10*3/uL — ABNORMAL LOW (ref 150–400)
RBC: 3.89 MIL/uL (ref 3.87–5.11)
RDW: 18.1 % — ABNORMAL HIGH (ref 11.5–15.5)
WBC: 5.2 10*3/uL (ref 4.0–10.5)
nRBC: 0 % (ref 0.0–0.2)

## 2021-09-25 LAB — RENAL FUNCTION PANEL
Albumin: 2.9 g/dL — ABNORMAL LOW (ref 3.5–5.0)
Anion gap: 14 (ref 5–15)
BUN: 43 mg/dL — ABNORMAL HIGH (ref 6–20)
CO2: 28 mmol/L (ref 22–32)
Calcium: 9.7 mg/dL (ref 8.9–10.3)
Chloride: 90 mmol/L — ABNORMAL LOW (ref 98–111)
Creatinine, Ser: 4.55 mg/dL — ABNORMAL HIGH (ref 0.44–1.00)
GFR, Estimated: 11 mL/min — ABNORMAL LOW (ref >60)
Glucose, Bld: 154 mg/dL — ABNORMAL HIGH (ref 70–99)
Phosphorus: 3.9 mg/dL (ref 2.5–4.6)
Potassium: 4.1 mmol/L (ref 3.5–5.1)
Sodium: 132 mmol/L — ABNORMAL LOW (ref 135–145)

## 2021-09-25 LAB — MAGNESIUM: Magnesium: 2 mg/dL (ref 1.7–2.4)

## 2021-09-25 NOTE — Progress Notes (Signed)
[facility] Progress Note  Subjective: seen in room, no c/os  Vitals:   09/25/21 0352 09/25/21 0500 09/25/21 0749 09/25/21 1119  BP: 115/75  (!) 107/55 (!) 136/56  Pulse: 80  80 76  Resp: '17  19 20  '$ Temp:   99 F (37.2 C) 99 F (37.2 C)  TempSrc:   Axillary Oral  SpO2: 96%  96%   Weight:  94.4 kg    Height:        Physical Exam:         Adult female in bed in NAD Trach removed Chest cta bilaterally; unlabored S1S2 no rub Abd soft ntnd  Ext no LE edema appreciated Neuro awake and responds appropriately, L hemiparesis Access: LUE AVF +bruit  Home meds include - asa, tylenol, renavite, cadexomer iodine, phoslo, ceterizine, cholestyramine, hydralazine 10 bid, labetalol 200 bid, losartan 100, psyllium   CXR 6/24 and 6/26 > IMPRESSION: There has been no significant interval change in diffuse interstitial/reticulonodular opacities throughout the lungs. No new finding.      OP HD: [facility] MWF  4h  97.5kg  450/500  2/2.5 bath  15ga  AVF  Hep none - mircera 100 ug q2 - rocaltrol 1.0 mcg po tiw   Assessment/ Plan Right thalamic intracranial hemorrhage - w/ intraventricular hemorrhage and hydrocephalus. Underwent ventriculostomy drain placement, then VP shunt surgery 6/14 per neurosurgery.  Resp failure - trach removed 6/28.  ESRD - gets HD MWF. Next HD Monday.  HTN/volume  - is under dry wt, getting tube feeds, euvolemic, getting low dose norvasc and coreg. BP's good.  Anemia CKD - got aranesp on 6/17 at 100 mcg off schedule.  Is ordered for 100 mcg weekly on Fridays.  Hb 10's. Tsat 16 but ferritin 1400, will hold off on IV Fe for now.  CKD-MBD - On calcitriol. We transitioned to renvela due to hypercalcemia while here.  Improved. Only 2.5 calcium dialysate available here.  On nepro.  Atrial fib - per primary team Disposition - trach was dc'd, she could be dc'd to SNF if can sit in a chair for several hrs.    Kelly Splinter, MD 09/25/2021, 11:24  AM   Recent Labs  Lab 09/24/21 0219 09/25/21 0730  HGB 10.3* 9.6*  ALBUMIN 3.1* 2.9*  CALCIUM 10.0 9.7  PHOS 5.6* 3.9  CREATININE 6.19* 4.55*  K 3.9 4.1    Recent Labs  Lab 09/22/21 0326  IRON 37  TIBC 238*  FERRITIN 1,402*      Inpatient medications:  amantadine  200 mg Per Tube Weekly   amLODipine  2.5 mg Oral BID   calcitRIOL  1 mcg Per Tube Q M,W,F   carvedilol  6.25 mg Per NG tube BID WC   chlorhexidine  15 mL Mouth Rinse BID   Chlorhexidine Gluconate Cloth  6 each Topical Q0600   darbepoetin (ARANESP) injection - DIALYSIS  100 mcg Intravenous Q Fri-HD   feeding supplement (PROSource TF)  45 mL Per Tube Daily   fiber  1 packet Per Tube BID   heparin injection (subcutaneous)  5,000 Units Subcutaneous Q8H   insulin aspart  0-9 Units Subcutaneous Q4H   multivitamin  1 tablet Per Tube QHS   mouth rinse  15 mL Mouth Rinse 4 times per day   pantoprazole sodium  40 mg Per Tube Daily   scopolamine  1 patch Transdermal Q72H   sevelamer carbonate  0.8 g Per Tube TID WC    sodium chloride 0 mL/hr  at 09/06/21 1141   feeding supplement (NEPRO CARB STEADY) 50 mL/hr at 09/25/21 0400   acetaminophen **OR** acetaminophen (TYLENOL) oral liquid 160 mg/5 mL **OR** acetaminophen, fentaNYL (SUBLIMAZE) injection, Gerhardt's butt cream, hydrALAZINE, ipratropium-albuterol, labetalol, ondansetron (ZOFRAN) IV, mouth rinse, polyvinyl alcohol, senna-docusate

## 2021-09-25 NOTE — Progress Notes (Signed)
PROGRESS NOTE    Janet Mitchell  ZHY:[id number] DOB: 13-Feb-1966 DOA: 08/23/2021 PCP: Janet Spar, MD  Chief Complaint  Patient presents with   Code Stroke    Brief Narrative:  Brief Narrative (HPI from H&P)  56 y.o. female with history of ESRD on HD MWF, CHF, poorly controlled HTN, DM presenting with nausea, vomiting, left side weakness present upon waking.  Scented to [facility] on 08/23/2021 with nausea vomiting left-sided weakness, work-up showed right-sided thalamic hemorrhage she was intubated and transferred to [facility] under ICU care for further treatment.  She was seen by [facility], nephrology for HD, neurology and neurosurgery, external ventricular drain placement by neurosurgery on 08/23/2021.  She also was intubated, trached, during her hospital stay she had tracheostomy site infection for which she is on antibiotic.  She has been transferred to hospitalist service on 09/13/2021 on day 20 one of her hospital stay with plan of going to [facility] versus SNF.     Events procedures   5/28 > Presents to [facility] ED, intubated. 5/29 > Transferred to [facility] Echocardiogram with EF 60%, no acute changes. 5/30 > HD, more awake, following commands with RUE/RLE 6/1 No issues overnight, she remains alert and interactive on vent.  Extubated 6/2 failed extubation requiring reintubation. Trached. 6/4 Placed on ATC yesterday afternoon and has tolerated well since 6/5: EVD raised to 20 cm H20 6/7: Failed EVD clamping trial after developing emesis and headache 6/8: Failed EVD clamping due to emesis 6/9: Increase EVD pressure to 25 6/12 back on vent due to hypoxia from mucus plugging, started back on antibiotics 6/14 VP shunt placed. Antibiotics adjusted based on cultures 6/17-has been off the vent since 09/10/2021, did not require to be on overnight. 6/19.  Transferred to hospitalist service 09/16/2021 PEG tube placed by IR Janet Mitchell     Assessment & Plan:   Principal Problem:    Intracranial hemorrhage ([place]) Active Problems:   Respiratory failure requiring intubation ([place])   History of tracheostomy   ESRD (end stage renal disease) on dialysis [facility])   Dysphagia   Essential hypertension   Anemia   Type 2 diabetes mellitus (HCC)   Obesity, morbid, BMI 50 or higher ([place])   Assessment and Plan: * Intracranial hemorrhage ([place]) Head CT 5/29 with 5 cc acute hematoma in R thalamus with intraventricular extension S/p external ventricular drain placement by neurosurgery on 5/29 Subsequent VP shunt placement 6/14 Hemorrhage was thought due to uncontrolled hypertension.  Brain bleed contributing to severe left-sided weakness, dysphagia along with toxic encephalopathy Was seen by neurosurgery and neurology Goal SBP <160 She'll need placement (LTACH when bed available)  S/p peg placement on 6/22  History of tracheostomy decannulated 6/28  Respiratory failure requiring intubation ([place]) Now with trach collar with tracheostomy site infection.  She has finished her course of antibiotics for treatment of Enterobacter and Staph aureus, with antibiotic stop date 09/15/2021 per PCCM team.  Tracheostomy deferred to PCCM.  There is Janet Mitchell question of ongoing aspiration of tube feeds also while getting PEG tube placed she had emesis and IR physician was suspicious of aspiration during the procedure on 09/16/2021.    She had increase in her tracheostomy site secretions, chest x-ray and CT scan showed bilateral infiltrates, although she has no fever or leukocytosis but due to witnessed aspiration during PEG tube placement and radiological findings will give her 5 days of Unasyn carting on 09/17/2021 and monitor.  Clinically gradual improvement.  Secretions also improved after addition  of scopolamine patch on 09/20/2021.    S/p MBS by speech, recommending dysphagia 1, thin liquids  decannulated 6/28  Continue to monitor for any signs of ongoing aspiration in the future.   Dysphagia SLP  recommending dysphagia 1, thin liquids, meds crushed with puree S/p PEG placement  ESRD (end stage renal disease) on dialysis [facility]) nephrology following If able to sit in chair for several hours, could potentially go to SNF (per renal)  Essential hypertension Amlodipine, coreg - dose adjusted by renal Maintain BP < 160  Anemia Elevated ferritin Suspect AOCD Will check b12/folate -> b12 elevated, folate wnl  Type 2 diabetes mellitus (HCC) a1c 6.2 follow  Obesity, morbid, BMI 50 or higher ([place]) noted       DVT prophylaxis: heparin Code Status: full Family Communication: none at bedside Disposition:   Status is: Inpatient Remains inpatient appropriate because: pending placement   Consultants:  Neurology Neurosurgery PCCM IR  Procedures:  Echo IMPRESSIONS     1. Left ventricular ejection fraction, by estimation, is 60 to 65%. The  left ventricle has normal function. The left ventricle has no regional  wall motion abnormalities. There is severe left ventricular hypertrophy.  Left ventricular diastolic parameters   are consistent with Grade II diastolic dysfunction (pseudonormalization).  Elevated left atrial pressure.   2. Right ventricular systolic function is normal. The right ventricular  size is normal.   3. Left atrial size was moderately dilated.   4. The mitral valve is normal in structure. Trivial mitral valve  regurgitation. No evidence of mitral stenosis.   5. The aortic valve is tricuspid. Aortic valve regurgitation is not  visualized. Aortic valve sclerosis is present, with no evidence of aortic  valve stenosis.   6. The inferior vena cava is normal in size with greater than 50%  respiratory variability, suggesting right atrial pressure of 3 mmHg.  6/23 IMPRESSION: Successful fluoroscopic guided percutaneous gastrostomy tube placement.  5/29 external ventricular drain 6/2 trach 6/14 VP shunt placed   Antimicrobials:  Anti-infectives  (From admission, onward)    Start     Dose/Rate Route Frequency Ordered Stop   09/17/21 1545  ampicillin-sulbactam (UNASYN) 1.5 g in sodium chloride 0.9 % 100 mL IVPB        1.5 g 200 mL/hr over 30 Minutes Intravenous Every 12 hours 09/17/21 1445 09/22/21 1544   09/16/21 1543  ceFAZolin (ANCEF) IVPB 2g/100 mL premix        over 30 Minutes Intravenous Continuous PRN 09/16/21 1552 09/16/21 1543   09/16/21 0500  ceFAZolin (ANCEF) IVPB 2g/100 mL premix        2 g 200 mL/hr over 30 Minutes Intravenous To Radiology 09/15/21 1358 09/17/21 0500   09/09/21 1800  ceFEPIme (MAXIPIME) 1 g in sodium chloride 0.9 % 100 mL IVPB        1 g 200 mL/hr over 30 Minutes Intravenous Every 24 hours 09/08/21 1116 09/15/21 1759   09/08/21 1330  ceFEPIme (MAXIPIME) 1 g in sodium chloride 0.9 % 100 mL IVPB        1 g 200 mL/hr over 30 Minutes Intravenous NOW 09/08/21 1234 09/08/21 1352   09/08/21 1215  cefTRIAXone (ROCEPHIN) 1 g in sodium chloride 0.9 % 100 mL IVPB  Status:  Discontinued        1 g 200 mL/hr over 30 Minutes Intravenous  Once 09/08/21 1116 09/08/21 1234   09/08/21 1200  vancomycin (VANCOCIN) IVPB 1000 mg/200 mL premix  Status:  Discontinued  1,000 mg 200 mL/hr over 60 Minutes Intravenous Every M-W-F (Hemodialysis) 09/06/21 1518 09/07/21 0741   09/08/21 0730  ceFAZolin (ANCEF) IVPB 2g/100 mL premix        2 g 200 mL/hr over 30 Minutes Intravenous On call to O.R. 09/08/21 7616 09/08/21 0848   09/06/21 2200  vancomycin (VANCOCIN) IVPB 1000 mg/200 mL premix  Status:  Discontinued        1,000 mg 200 mL/hr over 60 Minutes Intravenous  Once 09/06/21 1518 09/06/21 2156   09/06/21 2000  piperacillin-tazobactam (ZOSYN) IVPB 2.25 g  Status:  Discontinued        2.25 g 100 mL/hr over 30 Minutes Intravenous Every 8 hours 09/06/21 1518 09/08/21 1116   09/06/21 1115  piperacillin-tazobactam (ZOSYN) IVPB 3.375 g        3.375 g 100 mL/hr over 30 Minutes Intravenous  Once 09/06/21 1023 09/06/21 1132    09/06/21 1115  vancomycin (VANCOREADY) IVPB 2000 mg/400 mL        2,000 mg 200 mL/hr over 120 Minutes Intravenous  Once 09/06/21 1024 09/06/21 1342   08/23/21 1230  piperacillin-tazobactam (ZOSYN) IVPB 2.25 g        2.25 g 100 mL/hr over 30 Minutes Intravenous Every 8 hours 08/23/21 1141 08/28/21 0523       Subjective: Sleeping, awakens to voice, no new complaints  Objective: Vitals:   09/25/21 0352 09/25/21 0500 09/25/21 0749 09/25/21 1119  BP: 115/75  (!) 107/55 (!) 136/56  Pulse: 80  80 76  Resp: '17  19 20  '$ Temp:   99 F (37.2 C) 99 F (37.2 C)  TempSrc:   Axillary Oral  SpO2: 96%  96%   Weight:  94.4 kg    Height:        Intake/Output Summary (Last 24 hours) at 09/25/2021 1351 Last data filed at 09/25/2021 0400 Gross per 24 hour  Intake 1430 ml  Output --  Net 1430 ml   Filed Weights   09/24/21 0936 09/24/21 1523 09/25/21 0500  Weight: 95.6 kg 95.1 kg 94.4 kg    Examination:  General: No acute distress. Cardiovascular: RRR Lungs: unlabored, trach site with dressing over neck Abdomen: Soft, nontender, nondistended  Neurological: sleepy, awakens appropriately -> moving R side, follows instructions Extremities: No clubbing or cyanosis. No edema.   Data Reviewed: I have personally reviewed following labs and imaging studies  CBC: Recent Labs  Lab 09/21/21 0800 09/22/21 0326 09/23/21 0408 09/24/21 0219 09/25/21 0730  WBC 6.1 7.7 7.9 5.8 5.2  NEUTROABS 3.7 5.1 5.5 3.3 2.7  HGB 10.4* 10.4* 10.0* 10.3* 9.6*  HCT 32.6* 33.9* 31.0* 32.2* 31.2*  MCV 80.7 80.7 80.7 80.7 80.2  PLT 158 170 147* 140* 126*    Basic Metabolic Panel: Recent Labs  Lab 09/21/21 0800 09/22/21 0326 09/23/21 0408 09/24/21 0219 09/25/21 0730  NA 136 134* 135 134* 132*  K 3.9 4.4 3.8 3.9 4.1  CL 91* 89* 94* 88* 90*  CO2 '28 28 29 28 28  '$ GLUCOSE 119* 119* 136* 154* 154*  BUN 52* 69* 33* 55* 43*  CREATININE 5.39* 6.83* 4.61* 6.19* 4.55*  CALCIUM 9.8 10.2 9.5 10.0 9.7  MG  --    --  2.1 2.3 2.0  PHOS 4.5 6.1* 4.4 5.6* 3.9    GFR: Estimated Creatinine Clearance: 16.9 mL/min (Constance Hackenberg) (by C-G formula based on SCr of 4.55 mg/dL (H)).  Liver Function Tests: Recent Labs  Lab 09/21/21 0800 09/22/21 0326 09/23/21 0408 09/24/21 0219 09/25/21 0730  AST  --   --  23 22  --   ALT  --   --  6 7  --   ALKPHOS  --   --  91 85  --   BILITOT  --   --  0.6 0.5  --   PROT  --   --  8.1 7.8  --   ALBUMIN 3.2* 3.5 3.2* 3.1* 2.9*    CBG: Recent Labs  Lab 09/24/21 1924 09/24/21 2325 09/25/21 0342 09/25/21 0749 09/25/21 1120  GLUCAP 178* 176* 165* 146* 158*     No results found for this or any previous visit (from the past 240 hour(s)).       Radiology Studies: No results found.      Scheduled Meds:  amantadine  200 mg Per Tube Weekly   amLODipine  2.5 mg Oral BID   calcitRIOL  1 mcg Per Tube Q M,W,F   carvedilol  6.25 mg Per NG tube BID WC   chlorhexidine  15 mL Mouth Rinse BID   Chlorhexidine Gluconate Cloth  6 each Topical Q0600   darbepoetin (ARANESP) injection - DIALYSIS  100 mcg Intravenous Q Fri-HD   feeding supplement (PROSource TF)  45 mL Per Tube Daily   fiber  1 packet Per Tube BID   heparin injection (subcutaneous)  5,000 Units Subcutaneous Q8H   insulin aspart  0-9 Units Subcutaneous Q4H   multivitamin  1 tablet Per Tube QHS   mouth rinse  15 mL Mouth Rinse 4 times per day   pantoprazole sodium  40 mg Per Tube Daily   scopolamine  1 patch Transdermal Q72H   sevelamer carbonate  0.8 g Per Tube TID WC   Continuous Infusions:  sodium chloride 0 mL/hr at 09/06/21 1141   feeding supplement (NEPRO CARB STEADY) 50 mL/hr at 09/25/21 0400     LOS: 33 days    Time spent: over 30 min    Fayrene Helper, MD [facility]   To contact the attending provider between 7A-7P or the covering provider during after hours 7P-7A, please log into the web site [email or web address] and access using universal [place] password for that web site. If  you do not have the password, please call the hospital operator.  09/25/2021, 1:51 PM

## 2021-09-26 DIAGNOSIS — I629 Nontraumatic intracranial hemorrhage, unspecified: Secondary | ICD-10-CM | POA: Diagnosis not present

## 2021-09-26 LAB — COMPREHENSIVE METABOLIC PANEL
ALT: 7 U/L (ref 0–44)
AST: 19 U/L (ref 15–41)
Albumin: 3 g/dL — ABNORMAL LOW (ref 3.5–5.0)
Alkaline Phosphatase: 81 U/L (ref 38–126)
Anion gap: 17 — ABNORMAL HIGH (ref 5–15)
BUN: 65 mg/dL — ABNORMAL HIGH (ref 6–20)
CO2: 28 mmol/L (ref 22–32)
Calcium: 10 mg/dL (ref 8.9–10.3)
Chloride: 87 mmol/L — ABNORMAL LOW (ref 98–111)
Creatinine, Ser: 6.26 mg/dL — ABNORMAL HIGH (ref 0.44–1.00)
GFR, Estimated: 7 mL/min — ABNORMAL LOW (ref >60)
Glucose, Bld: 186 mg/dL — ABNORMAL HIGH (ref 70–99)
Potassium: 4.2 mmol/L (ref 3.5–5.1)
Sodium: 132 mmol/L — ABNORMAL LOW (ref 135–145)
Total Bilirubin: 0.6 mg/dL (ref 0.3–1.2)
Total Protein: 7.5 g/dL (ref 6.5–8.1)

## 2021-09-26 LAB — CBC WITH DIFFERENTIAL/PLATELET
Abs Immature Granulocytes: 0.02 10*3/uL (ref 0.00–0.07)
Basophils Absolute: 0.1 10*3/uL (ref 0.0–0.1)
Basophils Relative: 1 %
Eosinophils Absolute: 0.3 10*3/uL (ref 0.0–0.5)
Eosinophils Relative: 5 %
HCT: 31 % — ABNORMAL LOW (ref 36.0–46.0)
Hemoglobin: 9.6 g/dL — ABNORMAL LOW (ref 12.0–15.0)
Immature Granulocytes: 0 %
Lymphocytes Relative: 25 %
Lymphs Abs: 1.4 10*3/uL (ref 0.7–4.0)
MCH: 25.1 pg — ABNORMAL LOW (ref 26.0–34.0)
MCHC: 31 g/dL (ref 30.0–36.0)
MCV: 80.9 fL (ref 80.0–100.0)
Monocytes Absolute: 0.8 10*3/uL (ref 0.1–1.0)
Monocytes Relative: 15 %
Neutro Abs: 2.9 10*3/uL (ref 1.7–7.7)
Neutrophils Relative %: 54 %
Platelets: 135 10*3/uL — ABNORMAL LOW (ref 150–400)
RBC: 3.83 MIL/uL — ABNORMAL LOW (ref 3.87–5.11)
RDW: 17.9 % — ABNORMAL HIGH (ref 11.5–15.5)
WBC: 5.4 10*3/uL (ref 4.0–10.5)
nRBC: 0 % (ref 0.0–0.2)

## 2021-09-26 LAB — GLUCOSE, CAPILLARY
Glucose-Capillary: 144 mg/dL — ABNORMAL HIGH (ref 70–99)
Glucose-Capillary: 164 mg/dL — ABNORMAL HIGH (ref 70–99)
Glucose-Capillary: 190 mg/dL — ABNORMAL HIGH (ref 70–99)
Glucose-Capillary: 193 mg/dL — ABNORMAL HIGH (ref 70–99)
Glucose-Capillary: 198 mg/dL — ABNORMAL HIGH (ref 70–99)
Glucose-Capillary: 237 mg/dL — ABNORMAL HIGH (ref 70–99)

## 2021-09-26 LAB — PHOSPHORUS: Phosphorus: 4.4 mg/dL (ref 2.5–4.6)

## 2021-09-26 LAB — MAGNESIUM: Magnesium: 2.2 mg/dL (ref 1.7–2.4)

## 2021-09-26 MED ORDER — CHLORHEXIDINE GLUCONATE CLOTH 2 % EX PADS
6.0000 | MEDICATED_PAD | Freq: Every day | CUTANEOUS | Status: DC
  Administered 2021-09-27 – 2021-10-04 (×8): 6 via TOPICAL

## 2021-09-26 MED ORDER — CARVEDILOL 3.125 MG PO TABS
3.1250 mg | ORAL_TABLET | Freq: Two times a day (BID) | ORAL | Status: DC
  Administered 2021-09-27 – 2021-09-29 (×4): 3.125 mg via NASOGASTRIC
  Filled 2021-09-26 (×4): qty 1

## 2021-09-26 NOTE — Progress Notes (Signed)
[facility] Progress Note   Subjective:   Sleeping but awakens to voice. Denies SOB, CP, dizziness and nausea  Objective Vitals:   09/26/21 0019 09/26/21 0359 09/26/21 0741 09/26/21 1109  BP: (!) 134/59 108/63 (!) 121/43 (!) 95/33  Pulse: 78 78    Resp: 18 18    Temp: 98.4 F (36.9 C) 98.4 F (36.9 C) 98.4 F (36.9 C) 98.6 F (37 C)  TempSrc: Oral Oral    SpO2: 100% 98% 93%   Weight:      Height:       Physical Exam General: Alert female in NAD Heart: RRR, no murmurs, rubs are gallops Lungs: CTA bilaterally, respirations unlabored Abdomen: Soft, non-distended, +BS Extremities: no edema b/l lower extremities Dialysis Access: LUE AVF + bruit  Additional Objective Labs: Basic Metabolic Panel: Recent Labs  Lab 09/24/21 0219 09/25/21 0730 09/26/21 0251  NA 134* 132* 132*  K 3.9 4.1 4.2  CL 88* 90* 87*  CO2 '28 28 28  '$ GLUCOSE 154* 154* 186*  BUN 55* 43* 65*  CREATININE 6.19* 4.55* 6.26*  CALCIUM 10.0 9.7 10.0  PHOS 5.6* 3.9 4.4   Liver Function Tests: Recent Labs  Lab 09/23/21 0408 09/24/21 0219 09/25/21 0730 09/26/21 0251  AST 23 22  --  19  ALT 6 7  --  7  ALKPHOS 91 85  --  81  BILITOT 0.6 0.5  --  0.6  PROT 8.1 7.8  --  7.5  ALBUMIN 3.2* 3.1* 2.9* 3.0*   No results for input(s): "LIPASE", "AMYLASE" in the last 168 hours. CBC: Recent Labs  Lab 09/22/21 0326 09/23/21 0408 09/24/21 0219 09/25/21 0730 09/26/21 0251  WBC 7.7 7.9 5.8 5.2 5.4  NEUTROABS 5.1 5.5 3.3 2.7 2.9  HGB 10.4* 10.0* 10.3* 9.6* 9.6*  HCT 33.9* 31.0* 32.2* 31.2* 31.0*  MCV 80.7 80.7 80.7 80.2 80.9  PLT 170 147* 140* 126* 135*   Blood Culture    Component Value Date/Time   SDES BLOOD BLOOD RIGHT HAND 09/06/2021 1034   SPECREQUEST  09/06/2021 1034    BOTTLES DRAWN AEROBIC ONLY Blood Culture adequate volume   CULT  09/06/2021 1034    NO GROWTH 5 DAYS Performed at [facility], [place] [address], [place], [place] [address]    REPTSTATUS 09/11/2021 FINAL  09/06/2021 1034    Cardiac Enzymes: No results for input(s): "CKTOTAL", "CKMB", "CKMBINDEX", "TROPONINI" in the last 168 hours. CBG: Recent Labs  Lab 09/25/21 1943 09/26/21 0014 09/26/21 0347 09/26/21 0804 09/26/21 1137  GLUCAP 203* 193* 198* 164* 237*   Iron Studies: No results for input(s): "IRON", "TIBC", "TRANSFERRIN", "FERRITIN" in the last 72 hours. '@[email or web address]'$ @ Studies/Results: No results found. Medications:  sodium chloride 0 mL/hr at 09/06/21 1141   feeding supplement (NEPRO CARB STEADY) 50 mL/hr at 09/25/21 0400    amantadine  200 mg Per Tube Weekly   amLODipine  2.5 mg Oral BID   calcitRIOL  1 mcg Per Tube Q M,W,F   carvedilol  6.25 mg Per NG tube BID WC   chlorhexidine  15 mL Mouth Rinse BID   Chlorhexidine Gluconate Cloth  6 each Topical Q0600   darbepoetin (ARANESP) injection - DIALYSIS  100 mcg Intravenous Q Fri-HD   feeding supplement (PROSource TF)  45 mL Per Tube Daily   fiber  1 packet Per Tube BID   heparin injection (subcutaneous)  5,000 Units Subcutaneous Q8H   insulin aspart  0-9 Units Subcutaneous Q4H   multivitamin  1 tablet  Per Tube QHS   mouth rinse  15 mL Mouth Rinse 4 times per day   pantoprazole sodium  40 mg Per Tube Daily   scopolamine  1 patch Transdermal Q72H   sevelamer carbonate  0.8 g Per Tube TID WC    Dialysis Orders: [facility] MWF  4h  97.5kg  450/500  2/2.5 bath  15ga  AVF  Hep none - mircera 100 ug q2 - rocaltrol 1.0 mcg po tiw  Assessment/Plan: Right thalamic intracranial hemorrhage - w/ intraventricular hemorrhage and hydrocephalus. Underwent ventriculostomy drain placement, then VP shunt surgery 6/14 per neurosurgery.  Resp failure - trach removed 6/28.  ESRD - gets HD MWF. Next HD Monday.  HTN/volume  - is under dry wt, getting tube feeds, euvolemic, getting low dose norvasc and coreg. BP's good.  Anemia CKD - got aranesp on 6/17 at 100 mcg off schedule.  Is ordered for 100 mcg weekly on Fridays.  Hb 9.6. Tsat  16 but ferritin 1400, will hold off on IV Fe for now.  CKD-MBD - Calcium elevated, phos at goal. On calcitriol. We transitioned to renvela due to hypercalcemia while here.  Improved. Only 2.5 calcium dialysate available here.  On nepro.  Atrial fib - per primary team Disposition - trach was dc'd, she could be dc'd to SNF if can sit in a chair for several hrs.    Anice Paganini, PA-C 09/26/2021, 12:22 PM  [facility] Pager: [phone]

## 2021-09-26 NOTE — Progress Notes (Signed)
PROGRESS NOTE    Janet Mitchell  PYK:[id number] DOB: 1965/07/04 DOA: 08/23/2021 PCP: Lindell Spar, MD  Chief Complaint  Patient presents with   Code Stroke    Brief Narrative:  Brief Narrative (HPI from H&P)  56 y.o. female with history of ESRD on HD MWF, CHF, poorly controlled HTN, DM presenting with nausea, vomiting, left side weakness present upon waking.  Scented to [facility] on 08/23/2021 with nausea vomiting left-sided weakness, work-up showed right-sided thalamic hemorrhage she was intubated and transferred to [facility] under ICU care for further treatment.  She was seen by [facility], nephrology for HD, neurology and neurosurgery, external ventricular drain placement by neurosurgery on 08/23/2021.  She also was intubated, trached, during her hospital stay she had tracheostomy site infection for which she is on antibiotic.  She has been transferred to hospitalist service on 09/13/2021 on day 20 one of her hospital stay with plan of going to [facility] versus SNF.     Events procedures   5/28 > Presents to [facility] ED, intubated. 5/29 > Transferred to [facility] Echocardiogram with EF 60%, no acute changes. 5/30 > HD, more awake, following commands with RUE/RLE 6/1 No issues overnight, she remains alert and interactive on vent.  Extubated 6/2 failed extubation requiring reintubation. Trached. 6/4 Placed on ATC yesterday afternoon and has tolerated well since 6/5: EVD raised to 20 cm H20 6/7: Failed EVD clamping trial after developing emesis and headache 6/8: Failed EVD clamping due to emesis 6/9: Increase EVD pressure to 25 6/12 back on vent due to hypoxia from mucus plugging, started back on antibiotics 6/14 VP shunt placed. Antibiotics adjusted based on cultures 6/17-has been off the vent since 09/10/2021, did not require to be on overnight. 6/19.  Transferred to hospitalist service 09/16/2021 PEG tube placed by IR Dr. Anselm Pancoast     Assessment & Plan:   Principal Problem:    Intracranial hemorrhage ([place]) Active Problems:   Respiratory failure requiring intubation ([place])   History of tracheostomy   ESRD (end stage renal disease) on dialysis [facility])   Dysphagia   Essential hypertension   Anemia   Type 2 diabetes mellitus (HCC)   Obesity, morbid, BMI 50 or higher ([place])   Assessment and Plan: * Intracranial hemorrhage ([place]) Head CT 5/29 with 5 cc acute hematoma in R thalamus with intraventricular extension S/p external ventricular drain placement by neurosurgery on 5/29 Subsequent VP shunt placement 6/14 Hemorrhage was thought due to uncontrolled hypertension.  Brain bleed contributing to severe left-sided weakness, dysphagia along with toxic encephalopathy Was seen by neurosurgery and neurology Goal SBP <160 She'll need placement (LTACH when bed available)  S/p peg placement on 6/22  History of tracheostomy decannulated 6/28  Respiratory failure requiring intubation ([place]) Now with trach collar with tracheostomy site infection.  She has finished her course of antibiotics for treatment of Enterobacter and Staph aureus, with antibiotic stop date 09/15/2021 per PCCM team.  Tracheostomy deferred to PCCM.  There is Jacinda Kanady question of ongoing aspiration of tube feeds also while getting PEG tube placed she had emesis and IR physician was suspicious of aspiration during the procedure on 09/16/2021.    She had increase in her tracheostomy site secretions, chest x-ray and CT scan showed bilateral infiltrates, although she has no fever or leukocytosis but due to witnessed aspiration during PEG tube placement and radiological findings will give her 5 days of Unasyn carting on 09/17/2021 and monitor.  Clinically gradual improvement.  Secretions also improved after addition  of scopolamine patch on 09/20/2021.    S/p MBS by speech, recommending dysphagia 1, thin liquids  decannulated 6/28  Continue to monitor for any signs of ongoing aspiration in the future.   Dysphagia SLP  recommending dysphagia 1, thin liquids, meds crushed with puree S/p PEG placement  ESRD (end stage renal disease) on dialysis [facility]) nephrology following If able to sit in chair for several hours, could potentially go to SNF (per renal)  Essential hypertension Amlodipine, coreg - dose adjusted by renal Maintain BP < 160  Anemia Elevated ferritin Suspect AOCD Will check b12/folate -> b12 elevated, folate wnl  Type 2 diabetes mellitus (HCC) a1c 6.2 follow  Obesity, morbid, BMI 50 or higher ([place]) noted       DVT prophylaxis: heparin Code Status: full Family Communication: none at bedside Disposition:   Status is: Inpatient Remains inpatient appropriate because: pending placement   Consultants:  Neurology Neurosurgery PCCM IR  Procedures:  Echo IMPRESSIONS     1. Left ventricular ejection fraction, by estimation, is 60 to 65%. The  left ventricle has normal function. The left ventricle has no regional  wall motion abnormalities. There is severe left ventricular hypertrophy.  Left ventricular diastolic parameters   are consistent with Grade II diastolic dysfunction (pseudonormalization).  Elevated left atrial pressure.   2. Right ventricular systolic function is normal. The right ventricular  size is normal.   3. Left atrial size was moderately dilated.   4. The mitral valve is normal in structure. Trivial mitral valve  regurgitation. No evidence of mitral stenosis.   5. The aortic valve is tricuspid. Aortic valve regurgitation is not  visualized. Aortic valve sclerosis is present, with no evidence of aortic  valve stenosis.   6. The inferior vena cava is normal in size with greater than 50%  respiratory variability, suggesting right atrial pressure of 3 mmHg.  6/23 IMPRESSION: Successful fluoroscopic guided percutaneous gastrostomy tube placement.  5/29 external ventricular drain 6/2 trach 6/14 VP shunt placed   Antimicrobials:  Anti-infectives  (From admission, onward)    Start     Dose/Rate Route Frequency Ordered Stop   09/17/21 1545  ampicillin-sulbactam (UNASYN) 1.5 g in sodium chloride 0.9 % 100 mL IVPB        1.5 g 200 mL/hr over 30 Minutes Intravenous Every 12 hours 09/17/21 1445 09/22/21 1544   09/16/21 1543  ceFAZolin (ANCEF) IVPB 2g/100 mL premix        over 30 Minutes Intravenous Continuous PRN 09/16/21 1552 09/16/21 1543   09/16/21 0500  ceFAZolin (ANCEF) IVPB 2g/100 mL premix        2 g 200 mL/hr over 30 Minutes Intravenous To Radiology 09/15/21 1358 09/17/21 0500   09/09/21 1800  ceFEPIme (MAXIPIME) 1 g in sodium chloride 0.9 % 100 mL IVPB        1 g 200 mL/hr over 30 Minutes Intravenous Every 24 hours 09/08/21 1116 09/15/21 1759   09/08/21 1330  ceFEPIme (MAXIPIME) 1 g in sodium chloride 0.9 % 100 mL IVPB        1 g 200 mL/hr over 30 Minutes Intravenous NOW 09/08/21 1234 09/08/21 1352   09/08/21 1215  cefTRIAXone (ROCEPHIN) 1 g in sodium chloride 0.9 % 100 mL IVPB  Status:  Discontinued        1 g 200 mL/hr over 30 Minutes Intravenous  Once 09/08/21 1116 09/08/21 1234   09/08/21 1200  vancomycin (VANCOCIN) IVPB 1000 mg/200 mL premix  Status:  Discontinued  1,000 mg 200 mL/hr over 60 Minutes Intravenous Every M-W-F (Hemodialysis) 09/06/21 1518 09/07/21 0741   09/08/21 0730  ceFAZolin (ANCEF) IVPB 2g/100 mL premix        2 g 200 mL/hr over 30 Minutes Intravenous On call to O.R. 09/08/21 0981 09/08/21 0848   09/06/21 2200  vancomycin (VANCOCIN) IVPB 1000 mg/200 mL premix  Status:  Discontinued        1,000 mg 200 mL/hr over 60 Minutes Intravenous  Once 09/06/21 1518 09/06/21 2156   09/06/21 2000  piperacillin-tazobactam (ZOSYN) IVPB 2.25 g  Status:  Discontinued        2.25 g 100 mL/hr over 30 Minutes Intravenous Every 8 hours 09/06/21 1518 09/08/21 1116   09/06/21 1115  piperacillin-tazobactam (ZOSYN) IVPB 3.375 g        3.375 g 100 mL/hr over 30 Minutes Intravenous  Once 09/06/21 1023 09/06/21 1132    09/06/21 1115  vancomycin (VANCOREADY) IVPB 2000 mg/400 mL        2,000 mg 200 mL/hr over 120 Minutes Intravenous  Once 09/06/21 1024 09/06/21 1342   08/23/21 1230  piperacillin-tazobactam (ZOSYN) IVPB 2.25 g        2.25 g 100 mL/hr over 30 Minutes Intravenous Every 8 hours 08/23/21 1141 08/28/21 0523       Subjective: No new complaints Asking me to help look for her phone cords (we weren't able to find them - passed alng to nurse)  Objective: Vitals:   09/26/21 0359 09/26/21 0741 09/26/21 1109 09/26/21 1319  BP: 108/63 (!) 121/43 (!) 95/33 (!) 116/98  Pulse: 78   76  Resp: 18   19  Temp: 98.4 F (36.9 C) 98.4 F (36.9 C) 98.6 F (37 C)   TempSrc: Oral     SpO2: 98% 93%  90%  Weight:      Height:        Intake/Output Summary (Last 24 hours) at 09/26/2021 1345 Last data filed at 09/26/2021 0400 Gross per 24 hour  Intake 1320 ml  Output 1 ml  Net 1319 ml   Filed Weights   09/24/21 0936 09/24/21 1523 09/25/21 0500  Weight: 95.6 kg 95.1 kg 94.4 kg    Examination:  General: No acute distress. Cardiovascular: trach Lungs: unlabored Abdomen: Soft, nontender, nondistended - peg Neurological: L sided weakness Extremities: No clubbing or cyanosis. No edema.  Data Reviewed: I have personally reviewed following labs and imaging studies  CBC: Recent Labs  Lab 09/22/21 0326 09/23/21 0408 09/24/21 0219 09/25/21 0730 09/26/21 0251  WBC 7.7 7.9 5.8 5.2 5.4  NEUTROABS 5.1 5.5 3.3 2.7 2.9  HGB 10.4* 10.0* 10.3* 9.6* 9.6*  HCT 33.9* 31.0* 32.2* 31.2* 31.0*  MCV 80.7 80.7 80.7 80.2 80.9  PLT 170 147* 140* 126* 135*    Basic Metabolic Panel: Recent Labs  Lab 09/22/21 0326 09/23/21 0408 09/24/21 0219 09/25/21 0730 09/26/21 0251  NA 134* 135 134* 132* 132*  K 4.4 3.8 3.9 4.1 4.2  CL 89* 94* 88* 90* 87*  CO2 '28 29 28 28 28  '$ GLUCOSE 119* 136* 154* 154* 186*  BUN 69* 33* 55* 43* 65*  CREATININE 6.83* 4.61* 6.19* 4.55* 6.26*  CALCIUM 10.2 9.5 10.0 9.7 10.0  MG  --   2.1 2.3 2.0 2.2  PHOS 6.1* 4.4 5.6* 3.9 4.4    GFR: Estimated Creatinine Clearance: 12.3 mL/min (Salinda Snedeker) (by C-G formula based on SCr of 6.26 mg/dL (H)).  Liver Function Tests: Recent Labs  Lab 09/22/21 0326 09/23/21 0408 09/24/21  6861 09/25/21 0730 09/26/21 0251  AST  --  23 22  --  19  ALT  --  6 7  --  7  ALKPHOS  --  91 85  --  81  BILITOT  --  0.6 0.5  --  0.6  PROT  --  8.1 7.8  --  7.5  ALBUMIN 3.5 3.2* 3.1* 2.9* 3.0*    CBG: Recent Labs  Lab 09/25/21 1943 09/26/21 0014 09/26/21 0347 09/26/21 0804 09/26/21 1137  GLUCAP 203* 193* 198* 164* 237*     No results found for this or any previous visit (from the past 240 hour(s)).       Radiology Studies: No results found.      Scheduled Meds:  amantadine  200 mg Per Tube Weekly   amLODipine  2.5 mg Oral BID   calcitRIOL  1 mcg Per Tube Q M,W,F   carvedilol  6.25 mg Per NG tube BID WC   chlorhexidine  15 mL Mouth Rinse BID   Chlorhexidine Gluconate Cloth  6 each Topical Q0600   [START ON 09/27/2021] Chlorhexidine Gluconate Cloth  6 each Topical Q0600   darbepoetin (ARANESP) injection - DIALYSIS  100 mcg Intravenous Q Fri-HD   feeding supplement (PROSource TF)  45 mL Per Tube Daily   fiber  1 packet Per Tube BID   heparin injection (subcutaneous)  5,000 Units Subcutaneous Q8H   insulin aspart  0-9 Units Subcutaneous Q4H   multivitamin  1 tablet Per Tube QHS   mouth rinse  15 mL Mouth Rinse 4 times per day   pantoprazole sodium  40 mg Per Tube Daily   scopolamine  1 patch Transdermal Q72H   sevelamer carbonate  0.8 g Per Tube TID WC   Continuous Infusions:  sodium chloride 0 mL/hr at 09/06/21 1141   feeding supplement (NEPRO CARB STEADY) 50 mL/hr at 09/25/21 0400     LOS: 34 days    Time spent: over 30 min    Fayrene Helper, MD [facility]   To contact the attending provider between 7A-7P or the covering provider during after hours 7P-7A, please log into the web site [email or web address] and  access using universal [place] password for that web site. If you do not have the password, please call the hospital operator.  09/26/2021, 1:45 PM

## 2021-09-27 DIAGNOSIS — I629 Nontraumatic intracranial hemorrhage, unspecified: Secondary | ICD-10-CM | POA: Diagnosis not present

## 2021-09-27 LAB — COMPREHENSIVE METABOLIC PANEL
ALT: 9 U/L (ref 0–44)
AST: 15 U/L (ref 15–41)
Albumin: 3 g/dL — ABNORMAL LOW (ref 3.5–5.0)
Alkaline Phosphatase: 91 U/L (ref 38–126)
Anion gap: 12 (ref 5–15)
BUN: 88 mg/dL — ABNORMAL HIGH (ref 6–20)
CO2: 29 mmol/L (ref 22–32)
Calcium: 9.8 mg/dL (ref 8.9–10.3)
Chloride: 89 mmol/L — ABNORMAL LOW (ref 98–111)
Creatinine, Ser: 7.68 mg/dL — ABNORMAL HIGH (ref 0.44–1.00)
GFR, Estimated: 6 mL/min — ABNORMAL LOW (ref >60)
Glucose, Bld: 135 mg/dL — ABNORMAL HIGH (ref 70–99)
Potassium: 4.2 mmol/L (ref 3.5–5.1)
Sodium: 130 mmol/L — ABNORMAL LOW (ref 135–145)
Total Bilirubin: 0.4 mg/dL (ref 0.3–1.2)
Total Protein: 7.6 g/dL (ref 6.5–8.1)

## 2021-09-27 LAB — GLUCOSE, CAPILLARY
Glucose-Capillary: 159 mg/dL — ABNORMAL HIGH (ref 70–99)
Glucose-Capillary: 162 mg/dL — ABNORMAL HIGH (ref 70–99)
Glucose-Capillary: 170 mg/dL — ABNORMAL HIGH (ref 70–99)
Glucose-Capillary: 176 mg/dL — ABNORMAL HIGH (ref 70–99)
Glucose-Capillary: 204 mg/dL — ABNORMAL HIGH (ref 70–99)
Glucose-Capillary: 222 mg/dL — ABNORMAL HIGH (ref 70–99)

## 2021-09-27 LAB — CBC WITH DIFFERENTIAL/PLATELET
Abs Immature Granulocytes: 0.02 10*3/uL (ref 0.00–0.07)
Basophils Absolute: 0.1 10*3/uL (ref 0.0–0.1)
Basophils Relative: 1 %
Eosinophils Absolute: 0.3 10*3/uL (ref 0.0–0.5)
Eosinophils Relative: 7 %
HCT: 30.5 % — ABNORMAL LOW (ref 36.0–46.0)
Hemoglobin: 9.9 g/dL — ABNORMAL LOW (ref 12.0–15.0)
Immature Granulocytes: 0 %
Lymphocytes Relative: 26 %
Lymphs Abs: 1.3 10*3/uL (ref 0.7–4.0)
MCH: 25.8 pg — ABNORMAL LOW (ref 26.0–34.0)
MCHC: 32.5 g/dL (ref 30.0–36.0)
MCV: 79.4 fL — ABNORMAL LOW (ref 80.0–100.0)
Monocytes Absolute: 0.8 10*3/uL (ref 0.1–1.0)
Monocytes Relative: 16 %
Neutro Abs: 2.5 10*3/uL (ref 1.7–7.7)
Neutrophils Relative %: 50 %
Platelets: 131 10*3/uL — ABNORMAL LOW (ref 150–400)
RBC: 3.84 MIL/uL — ABNORMAL LOW (ref 3.87–5.11)
RDW: 17.6 % — ABNORMAL HIGH (ref 11.5–15.5)
WBC: 4.9 10*3/uL (ref 4.0–10.5)
nRBC: 0 % (ref 0.0–0.2)

## 2021-09-27 LAB — PHOSPHORUS: Phosphorus: 4.3 mg/dL (ref 2.5–4.6)

## 2021-09-27 LAB — MAGNESIUM: Magnesium: 2.4 mg/dL (ref 1.7–2.4)

## 2021-09-27 MED ORDER — PENTAFLUOROPROP-TETRAFLUOROETH EX AERO: 1.0000 | INHALATION_SPRAY | CUTANEOUS | Status: DC | PRN

## 2021-09-27 MED ORDER — AMLODIPINE BESYLATE 2.5 MG PO TABS
2.5000 mg | ORAL_TABLET | Freq: Every day | ORAL | Status: DC
  Administered 2021-09-27 – 2021-09-28 (×2): 2.5 mg via ORAL
  Filled 2021-09-27 (×2): qty 1

## 2021-09-27 MED ORDER — LIDOCAINE HCL (PF) 1 % IJ SOLN: 5.0000 mL | INTRAMUSCULAR | Status: DC | PRN

## 2021-09-27 NOTE — Progress Notes (Addendum)
Physical Therapy Treatment Patient Details Name: Janet Mitchell MRN: [id number] DOB: 1965/12/22 Today's Date: 09/27/2021   History of Present Illness 56 y.o. female presents to [facility] hospital as a transfer from [place] on 08/23/2021 with complaints of nausea, vomiting, L weakness. Pt required emergent intubation during transfer from [facility]. CTH reveals R thalamic hemorrhage with IVH. Pt underwent R frontal ventriculostomy on 5/29. Extubated 6/1, reintubated 6/2, tracheostomy 6/2. Pt failed EVD clamping and VP shunt placed 6/14. PMH includes HTN, CHF, MDII, ESRD.    PT Comments    Noted end of last week that pt needed to be assessed for ability to sit up in chair for transport to/from and during dialysis. Spoke with RN who was aware of plan but stated nursing has not lifted pt to chair due to her tendency to lean to the right and fear that she could fall out of the chair. Patient in dialysis most of the day, but on return observed her in the bed leaning to her right with pillow beside her head to keep her off the railing. Provided positioning and stretching to achieve pt in midline, upright position in bed. Returned after 20 minutes to assess her ability to maintain position and pt had fallen asleep and was again leaning to her right. Agree with nursing that pt is not currently safe to be left up in a recliner chair or regular wheelchair with supervision only. Discussed with Jacqualin Combes from [facility] team. Currently patient would require a tilt-in-space chair with lateral trunk supports and head support to be safe to travel to/from outpatient hemodialysis. Physical Therapy will continue to work with pt and assess discharge needs.      Recommendations for follow up therapy are one component of a multi-disciplinary discharge planning process, led by the attending physician.  Recommendations may be updated based on patient status, additional functional criteria and insurance authorization.  Follow Up  Recommendations  Skilled nursing-short term rehab (<3 hours/day) Can patient physically be transported by private vehicle: No   Assistance Recommended at Discharge Frequent or constant Supervision/Assistance  Patient can return home with the following Two people to help with walking and/or transfers;Two people to help with bathing/dressing/bathroom;Assistance with cooking/housework;Assistance with feeding;Direct supervision/assist for medications management;Direct supervision/assist for financial management;Assist for transportation;Help with stairs or ramp for entrance   Equipment Recommendations  Hospital bed;Other (comment);Wheelchair (measurements PT);Wheelchair cushion (measurements PT) (mechanical lift; tilt-in-space wheelchair with lateral trunk supports and head support)    Recommendations for Other Services       Precautions / Restrictions Precautions Precautions: Fall Precaution Comments: dense L hemi, VP shunt, PEG, vertigo Restrictions Weight Bearing Restrictions: No     Mobility  Bed Mobility               General bed mobility comments: repositioned in midline and up in chair position to assess ability to stay upright in chair    Transfers                        Ambulation/Gait                   Stairs             Wheelchair Mobility    Modified Rankin (Stroke Patients Only) Modified Rankin (Stroke Patients Only) Pre-Morbid Rankin Score: No symptoms Modified Rankin: Severe disability     Balance Overall balance assessment: Needs assistance Sitting-balance support: No upper extremity supported, Feet supported Sitting balance-Leahy Scale:  Poor Sitting balance - Comments: in bed, leaning to her right with head up against the bed rail on arrival                                    Cognition Arousal/Alertness: Awake/alert Behavior During Therapy: [facility] for tasks assessed/performed                                    General Comments: As in last session, pt with eyes open and maintained them open throughout the session, pt able to follow all simple step commands, pt will attend to her L side with verbal cues and was able to track to left to find PT with cues.        Exercises Other Exercises Other Exercises: positioned in midline "sitting" in chair position with head continuing to be laterally flexed to the right and rotated to the right, pulling pt to lean to her right. Worked on cervical ROM (especially left rotation ~45, left lateral flexion ~15, flexion to neutral (pt with suboccipital extension/tightness). After ROM, pt able to maintain midline sitting.    General Comments        Pertinent Vitals/Pain Pain Assessment Faces Pain Scale: No hurt Breathing: normal Negative Vocalization: none Facial Expression: smiling or inexpressive Body Language: relaxed Consolability: no need to console PAINAD Score: 0    Home Living Family/patient expects to be discharged to:: Private residence Living Arrangements: Spouse/significant other Available Help at Discharge: Family;Available 24 hours/day Type of Home: House Home Access: Stairs to enter Entrance Stairs-Rails: Left Entrance Stairs-Number of Steps: 3   Home Layout: One level Home Equipment: Conservation officer, nature (2 wheels);Shower seat;Toilet riser (spouse reports toilet riser is old and may need replacing)      Prior Function            PT Goals (current goals can now be found in the care plan section) Acute Rehab PT Goals Patient Stated Goal: none stated Time For Goal Achievement: 10/04/21 Potential to Achieve Goals: Poor (to fair) Progress towards PT goals: Progressing toward goals    Frequency    Min 2X/week      PT Plan Current plan remains appropriate;Frequency needs to be updated    Co-evaluation              AM-PAC PT "6 Clicks" Mobility   Outcome Measure  Help needed turning from your back to your  side while in a flat bed without using bedrails?: Total Help needed moving from lying on your back to sitting on the side of a flat bed without using bedrails?: Total Help needed moving to and from a bed to a chair (including a wheelchair)?: Total Help needed standing up from a chair using your arms (e.g., wheelchair or bedside chair)?: Total Help needed to walk in hospital room?: Total Help needed climbing 3-5 steps with a railing? : Total 6 Click Score: 6    End of Session Equipment Utilized During Treatment: Oxygen Activity Tolerance: Patient tolerated treatment well Patient left: in bed;with call bell/phone within reach;with bed alarm set;Other (comment) (sitting up in full chair position in the bed.) Nurse Communication: Mobility status;Need for lift equipment (dc plan now SNF and needs to be able to sit up for transport to/from HD) PT Visit Diagnosis: Other abnormalities of gait and mobility (R26.89);Other symptoms and signs involving  the nervous system (R29.898);Muscle weakness (generalized) (M62.81);Hemiplegia and hemiparesis Hemiplegia - Right/Left: Left Hemiplegia - dominant/non-dominant: Non-dominant Hemiplegia - caused by: Nontraumatic intracerebral hemorrhage     Time: 1446-1458 PT Time Calculation (min) (ACUTE ONLY): 12 min  Charges:  $Therapeutic Exercise: 8-22 mins                      Arby Barrette, PT Acute Rehabilitation Services  Office [phone]    Rexanne Mano 09/27/2021, 3:37 PM

## 2021-09-27 NOTE — Progress Notes (Signed)
PROGRESS NOTE    Janet Mitchell  IWL:[id number] DOB: 1966-03-28 DOA: 08/23/2021 PCP: Lindell Spar, MD  Chief Complaint  Patient presents with   Code Stroke    Brief Narrative:  Brief Narrative (HPI from H&P)  56 y.o. female with history of ESRD on HD MWF, CHF, poorly controlled HTN, DM presenting with nausea, vomiting, left side weakness present upon waking.  Scented to [facility] on 08/23/2021 with nausea vomiting left-sided weakness, work-up showed right-sided thalamic hemorrhage she was intubated and transferred to [facility] under ICU care for further treatment.  She was seen by [facility], nephrology for HD, neurology and neurosurgery, external ventricular drain placement by neurosurgery on 08/23/2021.  She also was intubated, trached, during her hospital stay she had tracheostomy site infection for which she is on antibiotic.  She has been transferred to hospitalist service on 09/13/2021 on day 20 one of her hospital stay with plan of going to [facility] versus SNF.     Events procedures   5/28 > Presents to [facility] ED, intubated. 5/29 > Transferred to [facility] Echocardiogram with EF 60%, no acute changes. 5/30 > HD, more awake, following commands with RUE/RLE 6/1 No issues overnight, she remains alert and interactive on vent.  Extubated 6/2 failed extubation requiring reintubation. Trached. 6/4 Placed on ATC yesterday afternoon and has tolerated well since 6/5: EVD raised to 20 cm H20 6/7: Failed EVD clamping trial after developing emesis and headache 6/8: Failed EVD clamping due to emesis 6/9: Increase EVD pressure to 25 6/12 back on vent due to hypoxia from mucus plugging, started back on antibiotics 6/14 VP shunt placed. Antibiotics adjusted based on cultures 6/17-has been off the vent since 09/10/2021, did not require to be on overnight. 6/19.  Transferred to hospitalist service 09/16/2021 PEG tube placed by IR Dr. Anselm Pancoast     Assessment & Plan:   Principal Problem:    Intracranial hemorrhage ([place]) Active Problems:   Respiratory failure requiring intubation ([place])   History of tracheostomy   ESRD (end stage renal disease) on dialysis [facility] [place])   Dysphagia   Essential hypertension   Anemia   Type 2 diabetes mellitus (HCC)   Obesity, morbid, BMI 50 or higher ([place])   Assessment and Plan: * Intracranial hemorrhage ([place]) Head CT 5/29 with 5 cc acute hematoma in R thalamus with intraventricular extension S/p external ventricular drain placement by neurosurgery on 5/29 Subsequent VP shunt placement 6/14 Hemorrhage was thought due to uncontrolled hypertension.  Brain bleed contributing to severe left-sided weakness, dysphagia along with toxic encephalopathy Was seen by neurosurgery and neurology Goal SBP <160 She'll need placement (LTACH when bed available, could go to SNF if can tolerate dialysis in chair)  S/p peg placement on 6/22  History of tracheostomy decannulated 6/28  Respiratory failure requiring intubation ([place]) Now with trach collar with tracheostomy site infection.  She has finished her course of antibiotics for treatment of Enterobacter and Staph aureus, with antibiotic stop date 09/15/2021 per PCCM team.  Tracheostomy deferred to PCCM.  There is Summer Mccolgan question of ongoing aspiration of tube feeds also while getting PEG tube placed she had emesis and IR physician was suspicious of aspiration during the procedure on 09/16/2021.    She had increase in her tracheostomy site secretions, chest x-ray and CT scan showed bilateral infiltrates, although she has no fever or leukocytosis but due to witnessed aspiration during PEG tube placement and radiological findings will give her 5 days of Unasyn carting on 09/17/2021 and monitor.  Clinically gradual improvement.  Secretions also improved after addition of scopolamine patch on 09/20/2021.    S/p MBS by speech, recommending dysphagia 1, thin liquids  decannulated 6/28  Continue to monitor for any signs of  ongoing aspiration in the future.   Dysphagia SLP recommending dysphagia 1, thin liquids, meds crushed with puree S/p PEG placement  ESRD (end stage renal disease) on dialysis [facility]) nephrology following If able to sit in chair for several hours, could potentially go to SNF (per renal)  Essential hypertension Coreg lower dose Amlodipine resumed lower dose Follow closely Maintain BP < 160  Anemia Elevated ferritin Suspect AOCD Will check b12/folate -> b12 elevated, folate wnl  Type 2 diabetes mellitus (HCC) a1c 6.2 follow  Obesity, morbid, BMI 50 or higher ([place]) noted       DVT prophylaxis: heparin Code Status: full Family Communication: none at bedside Disposition:   Status is: Inpatient Remains inpatient appropriate because: pending placement   Consultants:  Neurology Neurosurgery PCCM IR  Procedures:  Echo IMPRESSIONS     1. Left ventricular ejection fraction, by estimation, is 60 to 65%. The  left ventricle has normal function. The left ventricle has no regional  wall motion abnormalities. There is severe left ventricular hypertrophy.  Left ventricular diastolic parameters   are consistent with Grade II diastolic dysfunction (pseudonormalization).  Elevated left atrial pressure.   2. Right ventricular systolic function is normal. The right ventricular  size is normal.   3. Left atrial size was moderately dilated.   4. The mitral valve is normal in structure. Trivial mitral valve  regurgitation. No evidence of mitral stenosis.   5. The aortic valve is tricuspid. Aortic valve regurgitation is not  visualized. Aortic valve sclerosis is present, with no evidence of aortic  valve stenosis.   6. The inferior vena cava is normal in size with greater than 50%  respiratory variability, suggesting right atrial pressure of 3 mmHg.  6/23 IMPRESSION: Successful fluoroscopic guided percutaneous gastrostomy tube placement.  5/29 external ventricular  drain 6/2 trach 6/14 VP shunt placed   Antimicrobials:  Anti-infectives (From admission, onward)    Start     Dose/Rate Route Frequency Ordered Stop   09/17/21 1545  ampicillin-sulbactam (UNASYN) 1.5 g in sodium chloride 0.9 % 100 mL IVPB        1.5 g 200 mL/hr over 30 Minutes Intravenous Every 12 hours 09/17/21 1445 09/22/21 1544   09/16/21 1543  ceFAZolin (ANCEF) IVPB 2g/100 mL premix        over 30 Minutes Intravenous Continuous PRN 09/16/21 1552 09/16/21 1543   09/16/21 0500  ceFAZolin (ANCEF) IVPB 2g/100 mL premix        2 g 200 mL/hr over 30 Minutes Intravenous To Radiology 09/15/21 1358 09/17/21 0500   09/09/21 1800  ceFEPIme (MAXIPIME) 1 g in sodium chloride 0.9 % 100 mL IVPB        1 g 200 mL/hr over 30 Minutes Intravenous Every 24 hours 09/08/21 1116 09/15/21 1759   09/08/21 1330  ceFEPIme (MAXIPIME) 1 g in sodium chloride 0.9 % 100 mL IVPB        1 g 200 mL/hr over 30 Minutes Intravenous NOW 09/08/21 1234 09/08/21 1352   09/08/21 1215  cefTRIAXone (ROCEPHIN) 1 g in sodium chloride 0.9 % 100 mL IVPB  Status:  Discontinued        1 g 200 mL/hr over 30 Minutes Intravenous  Once 09/08/21 1116 09/08/21 1234   09/08/21 1200  vancomycin (VANCOCIN) IVPB  1000 mg/200 mL premix  Status:  Discontinued        1,000 mg 200 mL/hr over 60 Minutes Intravenous Every M-W-F (Hemodialysis) 09/06/21 1518 09/07/21 0741   09/08/21 0730  ceFAZolin (ANCEF) IVPB 2g/100 mL premix        2 g 200 mL/hr over 30 Minutes Intravenous On call to O.R. 09/08/21 5809 09/08/21 0848   09/06/21 2200  vancomycin (VANCOCIN) IVPB 1000 mg/200 mL premix  Status:  Discontinued        1,000 mg 200 mL/hr over 60 Minutes Intravenous  Once 09/06/21 1518 09/06/21 2156   09/06/21 2000  piperacillin-tazobactam (ZOSYN) IVPB 2.25 g  Status:  Discontinued        2.25 g 100 mL/hr over 30 Minutes Intravenous Every 8 hours 09/06/21 1518 09/08/21 1116   09/06/21 1115  piperacillin-tazobactam (ZOSYN) IVPB 3.375 g        3.375  g 100 mL/hr over 30 Minutes Intravenous  Once 09/06/21 1023 09/06/21 1132   09/06/21 1115  vancomycin (VANCOREADY) IVPB 2000 mg/400 mL        2,000 mg 200 mL/hr over 120 Minutes Intravenous  Once 09/06/21 1024 09/06/21 1342   08/23/21 1230  piperacillin-tazobactam (ZOSYN) IVPB 2.25 g        2.25 g 100 mL/hr over 30 Minutes Intravenous Every 8 hours 08/23/21 1141 08/28/21 0523       Subjective: No complaints during dialysis  Objective: Vitals:   09/27/21 1300 09/27/21 1322 09/27/21 1326 09/27/21 1426  BP: (!) 120/48 (!) 146/70  (!) 132/96  Pulse:  81  81  Resp:  17  18  Temp:      TempSrc:      SpO2:    96%  Weight:   94.5 kg   Height:        Intake/Output Summary (Last 24 hours) at 09/27/2021 1448 Last data filed at 09/27/2021 1322 Gross per 24 hour  Intake 1140 ml  Output 1000 ml  Net 140 ml   Filed Weights   09/27/21 0857 09/27/21 0914 09/27/21 1326  Weight: 95.6 kg 95.6 kg 94.5 kg    Examination:  General: No acute distress. Watching TV Cardiovascular: RRR Lungs: unlabored Neurological: L sided weakness Extremities: No clubbing or cyanosis. No edema.   Data Reviewed: I have personally reviewed following labs and imaging studies  CBC: Recent Labs  Lab 09/23/21 0408 09/24/21 0219 09/25/21 0730 09/26/21 0251 09/27/21 0208  WBC 7.9 5.8 5.2 5.4 4.9  NEUTROABS 5.5 3.3 2.7 2.9 2.5  HGB 10.0* 10.3* 9.6* 9.6* 9.9*  HCT 31.0* 32.2* 31.2* 31.0* 30.5*  MCV 80.7 80.7 80.2 80.9 79.4*  PLT 147* 140* 126* 135* 131*    Basic Metabolic Panel: Recent Labs  Lab 09/23/21 0408 09/24/21 0219 09/25/21 0730 09/26/21 0251 09/27/21 0208  NA 135 134* 132* 132* 130*  K 3.8 3.9 4.1 4.2 4.2  CL 94* 88* 90* 87* 89*  CO2 '29 28 28 28 29  '$ GLUCOSE 136* 154* 154* 186* 135*  BUN 33* 55* 43* 65* 88*  CREATININE 4.61* 6.19* 4.55* 6.26* 7.68*  CALCIUM 9.5 10.0 9.7 10.0 9.8  MG 2.1 2.3 2.0 2.2 2.4  PHOS 4.4 5.6* 3.9 4.4 4.3    GFR: Estimated Creatinine Clearance: 10 mL/min  (Yifan Auker) (by C-G formula based on SCr of 7.68 mg/dL (H)).  Liver Function Tests: Recent Labs  Lab 09/23/21 0408 09/24/21 0219 09/25/21 0730 09/26/21 0251 09/27/21 0208  AST 23 22  --  19 15  ALT 6  7  --  7 9  ALKPHOS 91 85  --  81 91  BILITOT 0.6 0.5  --  0.6 0.4  PROT 8.1 7.8  --  7.5 7.6  ALBUMIN 3.2* 3.1* 2.9* 3.0* 3.0*    CBG: Recent Labs  Lab 09/26/21 1556 09/26/21 2022 09/27/21 0003 09/27/21 0351 09/27/21 0810  GLUCAP 144* 190* 222* 170* 204*     No results found for this or any previous visit (from the past 240 hour(s)).       Radiology Studies: No results found.      Scheduled Meds:  amantadine  200 mg Per Tube Weekly   amLODipine  2.5 mg Oral Daily   calcitRIOL  1 mcg Per Tube Q M,W,F   carvedilol  3.125 mg Per NG tube BID WC   chlorhexidine  15 mL Mouth Rinse BID   Chlorhexidine Gluconate Cloth  6 each Topical Q0600   darbepoetin (ARANESP) injection - DIALYSIS  100 mcg Intravenous Q Fri-HD   feeding supplement (PROSource TF)  45 mL Per Tube Daily   fiber  1 packet Per Tube BID   heparin injection (subcutaneous)  5,000 Units Subcutaneous Q8H   insulin aspart  0-9 Units Subcutaneous Q4H   multivitamin  1 tablet Per Tube QHS   mouth rinse  15 mL Mouth Rinse 4 times per day   pantoprazole sodium  40 mg Per Tube Daily   scopolamine  1 patch Transdermal Q72H   sevelamer carbonate  0.8 g Per Tube TID WC   Continuous Infusions:  sodium chloride 0 mL/hr at 09/06/21 1141   feeding supplement (NEPRO CARB STEADY) 1,000 mL (09/27/21 0440)     LOS: 35 days    Time spent: over 30 min    Fayrene Helper, MD [facility]   To contact the attending provider between 7A-7P or the covering provider during after hours 7P-7A, please log into the web site [email or web address] and access using universal [place] password for that web site. If you do not have the password, please call the hospital operator.  09/27/2021, 2:48 PM

## 2021-09-27 NOTE — Progress Notes (Signed)
Patient ID: Janet Mitchell, female   DOB: 1965-05-18, 56 y.o.   MRN: [id number] S: Doesn't feel well this morning but wouldn't elaborate on symptoms.  No events overnight. O:BP 138/71 (BP Location: Right Arm)   Pulse 78   Temp 98.9 F (37.2 C) (Axillary)   Resp 18   Ht '5\' 7"'$  (1.702 m)   Wt 98.1 kg   SpO2 94%   BMI 33.88 kg/m   Intake/Output Summary (Last 24 hours) at 09/27/2021 0854 Last data filed at 09/27/2021 0439 Gross per 24 hour  Intake 1260 ml  Output --  Net 1260 ml   Intake/Output: I/O last 3 completed shifts: In: 2820 [P.O.:360; Other:120; NG/GT:2340] Out: 1 [Stool:1]  Intake/Output this shift:  No intake/output data recorded. Weight change:  Gen:NAD, alert. CVS:RRR Resp:CTA Abd: +BS, soft, NT/ND Ext: no edema, LUE AVF +T/B  Recent Labs  Lab 09/21/21 0800 09/22/21 0326 09/23/21 0408 09/24/21 0219 09/25/21 0730 09/26/21 0251 09/27/21 0208  NA 136 134* 135 134* 132* 132* 130*  K 3.9 4.4 3.8 3.9 4.1 4.2 4.2  CL 91* 89* 94* 88* 90* 87* 89*  CO2 '28 28 29 28 28 28 29  '$ GLUCOSE 119* 119* 136* 154* 154* 186* 135*  BUN 52* 69* 33* 55* 43* 65* 88*  CREATININE 5.39* 6.83* 4.61* 6.19* 4.55* 6.26* 7.68*  ALBUMIN 3.2* 3.5 3.2* 3.1* 2.9* 3.0* 3.0*  CALCIUM 9.8 10.2 9.5 10.0 9.7 10.0 9.8  PHOS 4.5 6.1* 4.4 5.6* 3.9 4.4 4.3  AST  --   --  23 22  --  19 15  ALT  --   --  6 7  --  7 9   Liver Function Tests: Recent Labs  Lab 09/24/21 0219 09/25/21 0730 09/26/21 0251 09/27/21 0208  AST 22  --  19 15  ALT 7  --  7 9  ALKPHOS 85  --  81 91  BILITOT 0.5  --  0.6 0.4  PROT 7.8  --  7.5 7.6  ALBUMIN 3.1* 2.9* 3.0* 3.0*   No results for input(s): "LIPASE", "AMYLASE" in the last 168 hours. No results for input(s): "AMMONIA" in the last 168 hours. CBC: Recent Labs  Lab 09/23/21 0408 09/24/21 0219 09/25/21 0730 09/26/21 0251 09/27/21 0208  WBC 7.9 5.8 5.2 5.4 4.9  NEUTROABS 5.5 3.3 2.7 2.9 2.5  HGB 10.0* 10.3* 9.6* 9.6* 9.9*  HCT 31.0* 32.2* 31.2* 31.0*  30.5*  MCV 80.7 80.7 80.2 80.9 79.4*  PLT 147* 140* 126* 135* 131*   Cardiac Enzymes: No results for input(s): "CKTOTAL", "CKMB", "CKMBINDEX", "TROPONINI" in the last 168 hours. CBG: Recent Labs  Lab 09/26/21 1556 09/26/21 2022 09/27/21 0003 09/27/21 0351 09/27/21 0810  GLUCAP 144* 190* 222* 170* 204*    Iron Studies: No results for input(s): "IRON", "TIBC", "TRANSFERRIN", "FERRITIN" in the last 72 hours. Studies/Results: No results found.  amantadine  200 mg Per Tube Weekly   calcitRIOL  1 mcg Per Tube Q M,W,F   carvedilol  3.125 mg Per NG tube BID WC   chlorhexidine  15 mL Mouth Rinse BID   Chlorhexidine Gluconate Cloth  6 each Topical Q0600   darbepoetin (ARANESP) injection - DIALYSIS  100 mcg Intravenous Q Fri-HD   feeding supplement (PROSource TF)  45 mL Per Tube Daily   fiber  1 packet Per Tube BID   heparin injection (subcutaneous)  5,000 Units Subcutaneous Q8H   insulin aspart  0-9 Units Subcutaneous Q4H   multivitamin  1 tablet Per Tube  QHS   mouth rinse  15 mL Mouth Rinse 4 times per day   pantoprazole sodium  40 mg Per Tube Daily   scopolamine  1 patch Transdermal Q72H   sevelamer carbonate  0.8 g Per Tube TID WC    BMET    Component Value Date/Time   NA 130 (L) 09/27/2021 0208   K 4.2 09/27/2021 0208   CL 89 (L) 09/27/2021 0208   CO2 29 09/27/2021 0208   GLUCOSE 135 (H) 09/27/2021 0208   BUN 88 (H) 09/27/2021 0208   CREATININE 7.68 (H) 09/27/2021 0208   CREATININE 5.02 (H) 08/29/2019 1506   CALCIUM 9.8 09/27/2021 0208   GFRNONAA 6 (L) 09/27/2021 0208   GFRNONAA 9 (L) 08/29/2019 1506   GFRAA 11 (L) 08/29/2019 1506   CBC    Component Value Date/Time   WBC 4.9 09/27/2021 0208   RBC 3.84 (L) 09/27/2021 0208   HGB 9.9 (L) 09/27/2021 0208   HGB 11.3 05/06/2021 1352   HCT 30.5 (L) 09/27/2021 0208   HCT 36.2 05/06/2021 1352   PLT 131 (L) 09/27/2021 0208   PLT 101 (L) 05/06/2021 1352   MCV 79.4 (L) 09/27/2021 0208   MCV 76 (L) 05/06/2021 1352   MCH  25.8 (L) 09/27/2021 0208   MCHC 32.5 09/27/2021 0208   RDW 17.6 (H) 09/27/2021 0208   RDW 18.0 (H) 05/06/2021 1352   LYMPHSABS 1.3 09/27/2021 0208   LYMPHSABS 1.5 05/06/2021 1352   MONOABS 0.8 09/27/2021 0208   EOSABS 0.3 09/27/2021 0208   EOSABS 0.3 05/06/2021 1352   BASOSABS 0.1 09/27/2021 0208   BASOSABS 0.1 05/06/2021 1352    Dialysis Orders: [facility] MWF  4h  97.5kg  450/500  2/2.5 bath  15ga  AVF  Hep none - mircera 100 ug q2 - rocaltrol 1.0 mcg po tiw   Assessment/Plan: Right thalamic intracranial hemorrhage - w/ intraventricular hemorrhage and hydrocephalus. Underwent ventriculostomy drain placement, then VP shunt surgery 6/14 per neurosurgery.  Resp failure - trach removed 6/28.  ESRD - gets HD MWF and will continue with schedule today. HTN/volume  - is under dry wt, getting tube feeds, euvolemic, getting low dose norvasc and coreg. BP's good.  Anemia CKD - got aranesp on 6/17 at 100 mcg off schedule.  Is ordered for 100 mcg weekly on Fridays.  Hb 9.6. Tsat 16 but ferritin 1400, will hold off on IV Fe for now.  CKD-MBD - Calcium elevated, phos at goal. On calcitriol. We transitioned to renvela due to hypercalcemia while here.  Improved. Only 2.5 calcium dialysate available here.  On nepro.  Atrial fib - per primary team Disposition - trach was dc'd, she could be dc'd to SNF if can sit in a chair for several hrs.    Donetta Potts, MD [facility]

## 2021-09-27 NOTE — Progress Notes (Signed)
Patient off floor to dialysis

## 2021-09-28 DIAGNOSIS — I629 Nontraumatic intracranial hemorrhage, unspecified: Secondary | ICD-10-CM | POA: Diagnosis not present

## 2021-09-28 LAB — GLUCOSE, CAPILLARY
Glucose-Capillary: 150 mg/dL — ABNORMAL HIGH (ref 70–99)
Glucose-Capillary: 152 mg/dL — ABNORMAL HIGH (ref 70–99)
Glucose-Capillary: 161 mg/dL — ABNORMAL HIGH (ref 70–99)
Glucose-Capillary: 163 mg/dL — ABNORMAL HIGH (ref 70–99)
Glucose-Capillary: 165 mg/dL — ABNORMAL HIGH (ref 70–99)
Glucose-Capillary: 169 mg/dL — ABNORMAL HIGH (ref 70–99)
Glucose-Capillary: 194 mg/dL — ABNORMAL HIGH (ref 70–99)

## 2021-09-28 LAB — COMPREHENSIVE METABOLIC PANEL
ALT: 9 U/L (ref 0–44)
AST: 18 U/L (ref 15–41)
Albumin: 3.1 g/dL — ABNORMAL LOW (ref 3.5–5.0)
Alkaline Phosphatase: 102 U/L (ref 38–126)
Anion gap: 13 (ref 5–15)
BUN: 42 mg/dL — ABNORMAL HIGH (ref 6–20)
CO2: 29 mmol/L (ref 22–32)
Calcium: 9.9 mg/dL (ref 8.9–10.3)
Chloride: 91 mmol/L — ABNORMAL LOW (ref 98–111)
Creatinine, Ser: 4.45 mg/dL — ABNORMAL HIGH (ref 0.44–1.00)
GFR, Estimated: 11 mL/min — ABNORMAL LOW (ref >60)
Glucose, Bld: 176 mg/dL — ABNORMAL HIGH (ref 70–99)
Potassium: 4.2 mmol/L (ref 3.5–5.1)
Sodium: 133 mmol/L — ABNORMAL LOW (ref 135–145)
Total Bilirubin: 0.5 mg/dL (ref 0.3–1.2)
Total Protein: 8.2 g/dL — ABNORMAL HIGH (ref 6.5–8.1)

## 2021-09-28 LAB — CBC WITH DIFFERENTIAL/PLATELET
Abs Immature Granulocytes: 0.02 10*3/uL (ref 0.00–0.07)
Basophils Absolute: 0.1 10*3/uL (ref 0.0–0.1)
Basophils Relative: 1 %
Eosinophils Absolute: 0.2 10*3/uL (ref 0.0–0.5)
Eosinophils Relative: 5 %
HCT: 36.7 % (ref 36.0–46.0)
Hemoglobin: 11.2 g/dL — ABNORMAL LOW (ref 12.0–15.0)
Immature Granulocytes: 0 %
Lymphocytes Relative: 26 %
Lymphs Abs: 1.3 10*3/uL (ref 0.7–4.0)
MCH: 24.9 pg — ABNORMAL LOW (ref 26.0–34.0)
MCHC: 30.5 g/dL (ref 30.0–36.0)
MCV: 81.7 fL (ref 80.0–100.0)
Monocytes Absolute: 0.7 10*3/uL (ref 0.1–1.0)
Monocytes Relative: 15 %
Neutro Abs: 2.6 10*3/uL (ref 1.7–7.7)
Neutrophils Relative %: 53 %
Platelets: 143 10*3/uL — ABNORMAL LOW (ref 150–400)
RBC: 4.49 MIL/uL (ref 3.87–5.11)
RDW: 18.6 % — ABNORMAL HIGH (ref 11.5–15.5)
WBC: 4.9 10*3/uL (ref 4.0–10.5)
nRBC: 0 % (ref 0.0–0.2)

## 2021-09-28 LAB — MAGNESIUM: Magnesium: 2.1 mg/dL (ref 1.7–2.4)

## 2021-09-28 LAB — PHOSPHORUS: Phosphorus: 3.1 mg/dL (ref 2.5–4.6)

## 2021-09-28 NOTE — Progress Notes (Signed)
Patient ID: Janet Mitchell, female   DOB: 09-25-1965, 56 y.o.   MRN: [id number] S: No complaints O:BP (!) 167/82 (BP Location: Right Arm)   Pulse 77   Temp 98.2 F (36.8 C) (Oral)   Resp 17   Ht '5\' 7"'$  (1.702 m)   Wt 95.3 kg   SpO2 100%   BMI 32.89 kg/m   Intake/Output Summary (Last 24 hours) at 09/28/2021 0900 Last data filed at 09/27/2021 1322 Gross per 24 hour  Intake --  Output 1000 ml  Net -1000 ml   Intake/Output: I/O last 3 completed shifts: In: 1140 [NG/GT:1140] Out: 1000 [Other:1000]  Intake/Output this shift:  No intake/output data recorded. Weight change: -2.495 kg Gen: NAD CVS: RRR Resp:CTA Abd: +BS, soft, NT/ND Ext:no edema, LUE AVF +T/B  Recent Labs  Lab 09/22/21 0326 09/23/21 0408 09/24/21 0219 09/25/21 0730 09/26/21 0251 09/27/21 0208 09/28/21 0047  NA 134* 135 134* 132* 132* 130* 133*  K 4.4 3.8 3.9 4.1 4.2 4.2 4.2  CL 89* 94* 88* 90* 87* 89* 91*  CO2 '28 29 28 28 28 29 29  '$ GLUCOSE 119* 136* 154* 154* 186* 135* 176*  BUN 69* 33* 55* 43* 65* 88* 42*  CREATININE 6.83* 4.61* 6.19* 4.55* 6.26* 7.68* 4.45*  ALBUMIN 3.5 3.2* 3.1* 2.9* 3.0* 3.0* 3.1*  CALCIUM 10.2 9.5 10.0 9.7 10.0 9.8 9.9  PHOS 6.1* 4.4 5.6* 3.9 4.4 4.3 3.1  AST  --  23 22  --  '19 15 18  '$ ALT  --  6 7  --  '7 9 9   '$ Liver Function Tests: Recent Labs  Lab 09/26/21 0251 09/27/21 0208 09/28/21 0047  AST '19 15 18  '$ ALT '7 9 9  '$ ALKPHOS 81 91 102  BILITOT 0.6 0.4 0.5  PROT 7.5 7.6 8.2*  ALBUMIN 3.0* 3.0* 3.1*   No results for input(s): "LIPASE", "AMYLASE" in the last 168 hours. No results for input(s): "AMMONIA" in the last 168 hours. CBC: Recent Labs  Lab 09/24/21 0219 09/25/21 0730 09/26/21 0251 09/27/21 0208 09/28/21 0047  WBC 5.8 5.2 5.4 4.9 4.9  NEUTROABS 3.3 2.7 2.9 2.5 2.6  HGB 10.3* 9.6* 9.6* 9.9* 11.2*  HCT 32.2* 31.2* 31.0* 30.5* 36.7  MCV 80.7 80.2 80.9 79.4* 81.7  PLT 140* 126* 135* 131* 143*   Cardiac Enzymes: No results for input(s): "CKTOTAL", "CKMB",  "CKMBINDEX", "TROPONINI" in the last 168 hours. CBG: Recent Labs  Lab 09/27/21 1616 09/27/21 2011 09/27/21 2311 09/28/21 0329 09/28/21 0856  GLUCAP 159* 162* 176* 150* 163*    Iron Studies: No results for input(s): "IRON", "TIBC", "TRANSFERRIN", "FERRITIN" in the last 72 hours. Studies/Results: No results found.  amantadine  200 mg Per Tube Weekly   amLODipine  2.5 mg Oral Daily   calcitRIOL  1 mcg Per Tube Q M,W,F   carvedilol  3.125 mg Per NG tube BID WC   chlorhexidine  15 mL Mouth Rinse BID   Chlorhexidine Gluconate Cloth  6 each Topical Q0600   darbepoetin (ARANESP) injection - DIALYSIS  100 mcg Intravenous Q Fri-HD   feeding supplement (PROSource TF)  45 mL Per Tube Daily   fiber  1 packet Per Tube BID   heparin injection (subcutaneous)  5,000 Units Subcutaneous Q8H   insulin aspart  0-9 Units Subcutaneous Q4H   multivitamin  1 tablet Per Tube QHS   mouth rinse  15 mL Mouth Rinse 4 times per day   pantoprazole sodium  40 mg Per Tube Daily  scopolamine  1 patch Transdermal Q72H   sevelamer carbonate  0.8 g Per Tube TID WC    BMET    Component Value Date/Time   NA 133 (L) 09/28/2021 0047   K 4.2 09/28/2021 0047   CL 91 (L) 09/28/2021 0047   CO2 29 09/28/2021 0047   GLUCOSE 176 (H) 09/28/2021 0047   BUN 42 (H) 09/28/2021 0047   CREATININE 4.45 (H) 09/28/2021 0047   CREATININE 5.02 (H) 08/29/2019 1506   CALCIUM 9.9 09/28/2021 0047   GFRNONAA 11 (L) 09/28/2021 0047   GFRNONAA 9 (L) 08/29/2019 1506   GFRAA 11 (L) 08/29/2019 1506   CBC    Component Value Date/Time   WBC 4.9 09/28/2021 0047   RBC 4.49 09/28/2021 0047   HGB 11.2 (L) 09/28/2021 0047   HGB 11.3 05/06/2021 1352   HCT 36.7 09/28/2021 0047   HCT 36.2 05/06/2021 1352   PLT 143 (L) 09/28/2021 0047   PLT 101 (L) 05/06/2021 1352   MCV 81.7 09/28/2021 0047   MCV 76 (L) 05/06/2021 1352   MCH 24.9 (L) 09/28/2021 0047   MCHC 30.5 09/28/2021 0047   RDW 18.6 (H) 09/28/2021 0047   RDW 18.0 (H)  05/06/2021 1352   LYMPHSABS 1.3 09/28/2021 0047   LYMPHSABS 1.5 05/06/2021 1352   MONOABS 0.7 09/28/2021 0047   EOSABS 0.2 09/28/2021 0047   EOSABS 0.3 05/06/2021 1352   BASOSABS 0.1 09/28/2021 0047   BASOSABS 0.1 05/06/2021 1352    Dialysis Orders: [facility] MWF  4h  97.5kg  450/500  2/2.5 bath  15ga  AVF  Hep none - mircera 100 ug q2 - rocaltrol 1.0 mcg po tiw   Assessment/Plan: Right thalamic intracranial hemorrhage - w/ intraventricular hemorrhage and hydrocephalus. Underwent ventriculostomy drain placement, then VP shunt surgery 6/14 per neurosurgery.  Resp failure - trach removed 6/28.  ESRD - gets HD MWF and will continue with schedule today. HTN/volume  - is under dry wt, getting tube feeds, euvolemic, getting low dose norvasc and coreg. BP's good.  Anemia CKD - got aranesp on 6/17 at 100 mcg off schedule.  Is ordered for 100 mcg weekly on Fridays.  Hb 9.6. Tsat 16 but ferritin 1400, will hold off on IV Fe for now.  CKD-MBD - Calcium elevated, phos at goal. On calcitriol. We transitioned to renvela due to hypercalcemia while here.  Improved. Only 2.5 calcium dialysate available here.  On nepro.  Atrial fib - per primary team Disposition - trach was dc'd, she could be dc'd to SNF if can sit in a chair for several hrs. Will order HD tomorrow in recliner.    Donetta Potts, MD [facility]

## 2021-09-28 NOTE — Progress Notes (Signed)
Speech Language Pathology Treatment: Dysphagia  Patient Details Name: Janet Mitchell MRN: [id number] DOB: May 14, 1965 Today's Date: 09/28/2021 Time: 1245-8099 SLP Time Calculation (min) (ACUTE ONLY): 25 min  Assessment / Plan / Recommendation Clinical Impression  Pt seen for skilled SLP to address dysphagia and cognitive linguistic deficits.  Today pt alert but requires frequent cues to attend to task.  After repositioning with assist, reflexive cough noted after HOB elevation.  SLP provided pt with po trials to determine readiness for diet advancement.  Note pt has a PEG for nutrition.  She was willing to consume po intake including graham crackers, applesauce, orange juice and water.  delayed cough x2 noted of approximately 10 boluses; suspect due to premature spillage.  Using teach back with minimal verbal cues, pt able to self feed solids *crackers* placing bolus in right oral cavity.  Minimally prolonged mastication noted but only minimal retention noted- which use of applesauce swallow facilitated clearance.  Pt appeared to become sleepy during session ,closing her eyes and saying " I feel so old".  Offered encouragement to pt reviewing decannulation, articulation improvement compared to during initial eval.  Anticipate her intake may improve with diet advancement - but strict precautions continue to be indicated.   Recommend advance to dys3/thin, encouraging pt to self feed with right hand.    Pt verbalized during treatment session but was minimal. She continues with decreased attention to the left but read correct DOW on the board.  She also benefited from min cue to verbalize need to place solid boluses on right side of mouth due to sensory deficits on the left from her CVA.  Followed all directions during treatment session but did become sleepy toward end - thus recommend short frequent sessions to maximize participation.      HPI HPI: Patient is a 56 y.o. female who presented to [facility] ER as a  code stroke with c/o nausea and vomiting upon waking up in the morning along with left sided weakness. She was communicative at [facility] but during stay awaiting transfer to [facility] she required emergent intubation due to inability to protect airway. 6/1 CXR showed Diffuse bilateral lung opacities may be due to pulmonary edema or multifocal pneumonia. MRI brain showed right thalamic intraparenchymal hemorrhage  with surrounding edema. She was extubated on 6/1 and although initially tolerated well, quickly was seen with poor, ineffective cough, copious secretions unable to clear independently but in no obvious distress and oxygen saturations 100% on 4L nasal cannula. She was reintubated on 6/2 to tracheostomy #6 cuffed Shiley.  Decannulated 6/28. Pt with PMH: ESRD on HD MWF, CHF, HTN, DM.      SLP Plan  Continue with current plan of care      Recommendations for follow up therapy are one component of a multi-disciplinary discharge planning process, led by the attending physician.  Recommendations may be updated based on patient status, additional functional criteria and insurance authorization.    Recommendations  Diet recommendations: Dysphagia 3 (mechanical soft);Thin liquid Liquids provided via: Straw Medication Administration: Crushed with puree Supervision: Trained caregiver to feed patient Compensations: Minimize environmental distractions;Slow rate;Small sips/bites;Monitor for anterior loss (Check L side for oral residue. Have suction availble to help clear oral cavity as needed, follow masticated solid with applesauce) Postural Changes and/or Swallow Maneuvers: Seated upright 90 degrees                Oral Care Recommendations: Oral care QID;Staff/trained caregiver to provide oral care Follow Up Recommendations: Acute inpatient rehab (  3hours/day) Assistance recommended at discharge: Frequent or constant Supervision/Assistance SLP Visit Diagnosis: Dysphagia, oropharyngeal phase (R13.12) Plan:  Continue with current plan of care         Kathleen Lime, MS [place] Office [phone] Pager [phone]   Macario Golds  09/28/2021, 8:33 AM

## 2021-09-28 NOTE — Progress Notes (Signed)
PROGRESS NOTE    Janet Mitchell  DZH:[id number] DOB: April 08, 1965 DOA: 08/23/2021 PCP: Lindell Spar, MD  Chief Complaint  Patient presents with   Code Stroke    Brief Narrative:  Brief Narrative (HPI from H&P)  56 y.o. female with history of ESRD on HD MWF, CHF, poorly controlled HTN, DM presenting with nausea, vomiting, left side weakness present upon waking.  Scented to [facility] on 08/23/2021 with nausea vomiting left-sided weakness, work-up showed right-sided thalamic hemorrhage she was intubated and transferred to [facility] under ICU care for further treatment.  She was seen by [facility], nephrology for HD, neurology and neurosurgery, external ventricular drain placement by neurosurgery on 08/23/2021.  She also was intubated, trached, during her hospital stay she had tracheostomy site infection for which she is on antibiotic.  She has been transferred to hospitalist service on 09/13/2021 on day 20 one of her hospital stay with plan of going to [facility] versus SNF.     Events procedures   5/28 > Presents to [facility] ED, intubated. 5/29 > Transferred to [facility] Echocardiogram with EF 60%, no acute changes. 5/30 > HD, more awake, following commands with RUE/RLE 6/1 No issues overnight, she remains alert and interactive on vent.  Extubated 6/2 failed extubation requiring reintubation. Trached. 6/4 Placed on ATC yesterday afternoon and has tolerated well since 6/5: EVD raised to 20 cm H20 6/7: Failed EVD clamping trial after developing emesis and headache 6/8: Failed EVD clamping due to emesis 6/9: Increase EVD pressure to 25 6/12 back on vent due to hypoxia from mucus plugging, started back on antibiotics 6/14 VP shunt placed. Antibiotics adjusted based on cultures 6/17-has been off the vent since 09/10/2021, did not require to be on overnight. 6/19.  Transferred to hospitalist service 09/16/2021 PEG tube placed by IR Dr. Anselm Pancoast 6/28 decannulated Awaiting placement at [facility] vs SNF  (if able to tolerate sitting up during dialysis)     Assessment & Plan:   Principal Problem:   Intracranial hemorrhage ([place]) Active Problems:   Respiratory failure requiring intubation ([place])   History of tracheostomy   ESRD (end stage renal disease) on dialysis [facility])   Dysphagia   Essential hypertension   Anemia   Type 2 diabetes mellitus ([place])   Obesity, morbid, BMI 50 or higher ([place])   Assessment and Plan: * Intracranial hemorrhage ([place]) Head CT 5/29 with 5 cc acute hematoma in R thalamus with intraventricular extension S/p external ventricular drain placement by neurosurgery on 5/29 Subsequent VP shunt placement 6/14 Hemorrhage was thought due to uncontrolled hypertension.  Brain bleed contributing to severe left-sided weakness, dysphagia along with toxic encephalopathy Was seen by neurosurgery and neurology Goal SBP <160 She'll need placement (LTACH when bed available, could go to SNF if can tolerate dialysis in chair)  S/p peg placement on 6/22  History of tracheostomy decannulated 6/28  Respiratory failure requiring intubation ([place]) Now with trach collar with tracheostomy site infection.  She has finished her course of antibiotics for treatment of Enterobacter and Staph aureus, with antibiotic stop date 09/15/2021 per PCCM team.  Tracheostomy deferred to PCCM.  There is Yvonda Fouty question of ongoing aspiration of tube feeds also while getting PEG tube placed she had emesis and IR physician was suspicious of aspiration during the procedure on 09/16/2021.    She had increase in her tracheostomy site secretions, chest x-ray and CT scan showed bilateral infiltrates, although she has no fever or leukocytosis but due to witnessed aspiration during PEG tube  placement and radiological findings will give her 5 days of Unasyn carting on 09/17/2021 and monitor.  Clinically gradual improvement.  Secretions also improved after addition of scopolamine patch on 09/20/2021.    S/p MBS by speech,  recommending dysphagia 1, thin liquids  decannulated 6/28  Continue to monitor for any signs of ongoing aspiration in the future.   Dysphagia SLP recommending dysphagia 1, thin liquids, meds crushed with puree S/p PEG placement  ESRD (end stage renal disease) on dialysis [facility]) nephrology following If able to sit in chair for several hours, could potentially go to SNF (per renal)  Essential hypertension Coreg lower dose Amlodipine resumed lower dose Follow closely Maintain BP < 160  Anemia Elevated ferritin Suspect AOCD Will check b12/folate -> b12 elevated, folate wnl  Type 2 diabetes mellitus (HCC) a1c 6.2 follow  Obesity, morbid, BMI 50 or higher ([place]) noted       DVT prophylaxis: heparin Code Status: full Family Communication: none at bedside Disposition:   Status is: Inpatient Remains inpatient appropriate because: pending placement   Consultants:  Neurology Neurosurgery PCCM IR  Procedures:  Echo IMPRESSIONS     1. Left ventricular ejection fraction, by estimation, is 60 to 65%. The  left ventricle has normal function. The left ventricle has no regional  wall motion abnormalities. There is severe left ventricular hypertrophy.  Left ventricular diastolic parameters   are consistent with Grade II diastolic dysfunction (pseudonormalization).  Elevated left atrial pressure.   2. Right ventricular systolic function is normal. The right ventricular  size is normal.   3. Left atrial size was moderately dilated.   4. The mitral valve is normal in structure. Trivial mitral valve  regurgitation. No evidence of mitral stenosis.   5. The aortic valve is tricuspid. Aortic valve regurgitation is not  visualized. Aortic valve sclerosis is present, with no evidence of aortic  valve stenosis.   6. The inferior vena cava is normal in size with greater than 50%  respiratory variability, suggesting right atrial pressure of 3  mmHg.  6/23 IMPRESSION: Successful fluoroscopic guided percutaneous gastrostomy tube placement.  5/29 external ventricular drain 6/2 trach 6/14 VP shunt placed   Antimicrobials:  Anti-infectives (From admission, onward)    Start     Dose/Rate Route Frequency Ordered Stop   09/17/21 1545  ampicillin-sulbactam (UNASYN) 1.5 g in sodium chloride 0.9 % 100 mL IVPB        1.5 g 200 mL/hr over 30 Minutes Intravenous Every 12 hours 09/17/21 1445 09/22/21 1544   09/16/21 1543  ceFAZolin (ANCEF) IVPB 2g/100 mL premix        over 30 Minutes Intravenous Continuous PRN 09/16/21 1552 09/16/21 1543   09/16/21 0500  ceFAZolin (ANCEF) IVPB 2g/100 mL premix        2 g 200 mL/hr over 30 Minutes Intravenous To Radiology 09/15/21 1358 09/17/21 0500   09/09/21 1800  ceFEPIme (MAXIPIME) 1 g in sodium chloride 0.9 % 100 mL IVPB        1 g 200 mL/hr over 30 Minutes Intravenous Every 24 hours 09/08/21 1116 09/15/21 1759   09/08/21 1330  ceFEPIme (MAXIPIME) 1 g in sodium chloride 0.9 % 100 mL IVPB        1 g 200 mL/hr over 30 Minutes Intravenous NOW 09/08/21 1234 09/08/21 1352   09/08/21 1215  cefTRIAXone (ROCEPHIN) 1 g in sodium chloride 0.9 % 100 mL IVPB  Status:  Discontinued        1 g 200 mL/hr over  30 Minutes Intravenous  Once 09/08/21 1116 09/08/21 1234   09/08/21 1200  vancomycin (VANCOCIN) IVPB 1000 mg/200 mL premix  Status:  Discontinued        1,000 mg 200 mL/hr over 60 Minutes Intravenous Every M-W-F (Hemodialysis) 09/06/21 1518 09/07/21 0741   09/08/21 0730  ceFAZolin (ANCEF) IVPB 2g/100 mL premix        2 g 200 mL/hr over 30 Minutes Intravenous On call to O.R. 09/08/21 5631 09/08/21 0848   09/06/21 2200  vancomycin (VANCOCIN) IVPB 1000 mg/200 mL premix  Status:  Discontinued        1,000 mg 200 mL/hr over 60 Minutes Intravenous  Once 09/06/21 1518 09/06/21 2156   09/06/21 2000  piperacillin-tazobactam (ZOSYN) IVPB 2.25 g  Status:  Discontinued        2.25 g 100 mL/hr over 30 Minutes  Intravenous Every 8 hours 09/06/21 1518 09/08/21 1116   09/06/21 1115  piperacillin-tazobactam (ZOSYN) IVPB 3.375 g        3.375 g 100 mL/hr over 30 Minutes Intravenous  Once 09/06/21 1023 09/06/21 1132   09/06/21 1115  vancomycin (VANCOREADY) IVPB 2000 mg/400 mL        2,000 mg 200 mL/hr over 120 Minutes Intravenous  Once 09/06/21 1024 09/06/21 1342   08/23/21 1230  piperacillin-tazobactam (ZOSYN) IVPB 2.25 g        2.25 g 100 mL/hr over 30 Minutes Intravenous Every 8 hours 08/23/21 1141 08/28/21 0523       Subjective: Denies any complaints  Objective: Vitals:   09/28/21 0418 09/28/21 0500 09/28/21 0749 09/28/21 1137  BP: 136/72  (!) 167/82 123/65  Pulse:   77 79  Resp:   17 17  Temp: 98.2 F (36.8 C)  98.2 F (36.8 C) 97.7 F (36.5 C)  TempSrc: Oral  Oral Oral  SpO2: 96%  100% 96%  Weight:  95.3 kg    Height:       No intake or output data in the 24 hours ending 09/28/21 1501  Filed Weights   09/27/21 0914 09/27/21 1326 09/28/21 0500  Weight: 95.6 kg 94.5 kg 95.3 kg    Examination:  General: No acute distress. Cardiovascular: RRR Lungs: unlabored Neurological: chronic L sided weakness, generally looking up.   Extremities: No clubbing or cyanosis. No edema.   Data Reviewed: I have personally reviewed following labs and imaging studies  CBC: Recent Labs  Lab 09/24/21 0219 09/25/21 0730 09/26/21 0251 09/27/21 0208 09/28/21 0047  WBC 5.8 5.2 5.4 4.9 4.9  NEUTROABS 3.3 2.7 2.9 2.5 2.6  HGB 10.3* 9.6* 9.6* 9.9* 11.2*  HCT 32.2* 31.2* 31.0* 30.5* 36.7  MCV 80.7 80.2 80.9 79.4* 81.7  PLT 140* 126* 135* 131* 143*    Basic Metabolic Panel: Recent Labs  Lab 09/24/21 0219 09/25/21 0730 09/26/21 0251 09/27/21 0208 09/28/21 0047  NA 134* 132* 132* 130* 133*  K 3.9 4.1 4.2 4.2 4.2  CL 88* 90* 87* 89* 91*  CO2 '28 28 28 29 29  '$ GLUCOSE 154* 154* 186* 135* 176*  BUN 55* 43* 65* 88* 42*  CREATININE 6.19* 4.55* 6.26* 7.68* 4.45*  CALCIUM 10.0 9.7 10.0 9.8  9.9  MG 2.3 2.0 2.2 2.4 2.1  PHOS 5.6* 3.9 4.4 4.3 3.1    GFR: Estimated Creatinine Clearance: 17.3 mL/min (Sharilyn Geisinger) (by C-G formula based on SCr of 4.45 mg/dL (H)).  Liver Function Tests: Recent Labs  Lab 09/23/21 0408 09/24/21 0219 09/25/21 0730 09/26/21 0251 09/27/21 0208 09/28/21 0047  AST 23  22  --  '19 15 18  '$ ALT 6 7  --  '7 9 9  '$ ALKPHOS 91 85  --  81 91 102  BILITOT 0.6 0.5  --  0.6 0.4 0.5  PROT 8.1 7.8  --  7.5 7.6 8.2*  ALBUMIN 3.2* 3.1* 2.9* 3.0* 3.0* 3.1*    CBG: Recent Labs  Lab 09/27/21 2011 09/27/21 2311 09/28/21 0329 09/28/21 0856 09/28/21 1136  GLUCAP 162* 176* 150* 163* 194*     No results found for this or any previous visit (from the past 240 hour(s)).       Radiology Studies: No results found.      Scheduled Meds:  amantadine  200 mg Per Tube Weekly   amLODipine  2.5 mg Oral Daily   calcitRIOL  1 mcg Per Tube Q M,W,F   carvedilol  3.125 mg Per NG tube BID WC   chlorhexidine  15 mL Mouth Rinse BID   Chlorhexidine Gluconate Cloth  6 each Topical Q0600   darbepoetin (ARANESP) injection - DIALYSIS  100 mcg Intravenous Q Fri-HD   feeding supplement (PROSource TF)  45 mL Per Tube Daily   fiber  1 packet Per Tube BID   heparin injection (subcutaneous)  5,000 Units Subcutaneous Q8H   insulin aspart  0-9 Units Subcutaneous Q4H   multivitamin  1 tablet Per Tube QHS   mouth rinse  15 mL Mouth Rinse 4 times per day   pantoprazole sodium  40 mg Per Tube Daily   scopolamine  1 patch Transdermal Q72H   sevelamer carbonate  0.8 g Per Tube TID WC   Continuous Infusions:  sodium chloride 0 mL/hr at 09/06/21 1141   feeding supplement (NEPRO CARB STEADY) 1,000 mL (09/28/21 0501)     LOS: 36 days    Time spent: over 30 min    Fayrene Helper, MD [facility]   To contact the attending provider between 7A-7P or the covering provider during after hours 7P-7A, please log into the web site [email or web address] and access using universal [facility] password for that web site. If you do not have the password, please call the hospital operator.  09/28/2021, 3:01 PM

## 2021-09-29 DIAGNOSIS — I10 Essential (primary) hypertension: Secondary | ICD-10-CM

## 2021-09-29 DIAGNOSIS — G911 Obstructive hydrocephalus: Secondary | ICD-10-CM | POA: Diagnosis not present

## 2021-09-29 DIAGNOSIS — J9690 Respiratory failure, unspecified, unspecified whether with hypoxia or hypercapnia: Secondary | ICD-10-CM

## 2021-09-29 DIAGNOSIS — I629 Nontraumatic intracranial hemorrhage, unspecified: Secondary | ICD-10-CM | POA: Diagnosis not present

## 2021-09-29 DIAGNOSIS — R1310 Dysphagia, unspecified: Secondary | ICD-10-CM | POA: Diagnosis not present

## 2021-09-29 DIAGNOSIS — Z9889 Other specified postprocedural states: Secondary | ICD-10-CM

## 2021-09-29 DIAGNOSIS — N186 End stage renal disease: Secondary | ICD-10-CM | POA: Diagnosis not present

## 2021-09-29 LAB — CBC WITH DIFFERENTIAL/PLATELET
Abs Immature Granulocytes: 0.02 10*3/uL (ref 0.00–0.07)
Basophils Absolute: 0 10*3/uL (ref 0.0–0.1)
Basophils Relative: 1 %
Eosinophils Absolute: 0.2 10*3/uL (ref 0.0–0.5)
Eosinophils Relative: 5 %
HCT: 37 % (ref 36.0–46.0)
Hemoglobin: 11.7 g/dL — ABNORMAL LOW (ref 12.0–15.0)
Immature Granulocytes: 0 %
Lymphocytes Relative: 29 %
Lymphs Abs: 1.5 10*3/uL (ref 0.7–4.0)
MCH: 25.2 pg — ABNORMAL LOW (ref 26.0–34.0)
MCHC: 31.6 g/dL (ref 30.0–36.0)
MCV: 79.7 fL — ABNORMAL LOW (ref 80.0–100.0)
Monocytes Absolute: 0.8 10*3/uL (ref 0.1–1.0)
Monocytes Relative: 16 %
Neutro Abs: 2.5 10*3/uL (ref 1.7–7.7)
Neutrophils Relative %: 49 %
Platelets: 145 10*3/uL — ABNORMAL LOW (ref 150–400)
RBC: 4.64 MIL/uL (ref 3.87–5.11)
RDW: 18.6 % — ABNORMAL HIGH (ref 11.5–15.5)
WBC: 5 10*3/uL (ref 4.0–10.5)
nRBC: 0 % (ref 0.0–0.2)

## 2021-09-29 LAB — GLUCOSE, CAPILLARY
Glucose-Capillary: 157 mg/dL — ABNORMAL HIGH (ref 70–99)
Glucose-Capillary: 178 mg/dL — ABNORMAL HIGH (ref 70–99)
Glucose-Capillary: 116 mg/dL — ABNORMAL HIGH (ref 70–99)
Glucose-Capillary: 174 mg/dL — ABNORMAL HIGH (ref 70–99)
Glucose-Capillary: 157 mg/dL — ABNORMAL HIGH (ref 70–99)
Glucose-Capillary: 164 mg/dL — ABNORMAL HIGH (ref 70–99)

## 2021-09-29 LAB — COMPREHENSIVE METABOLIC PANEL
ALT: 9 U/L (ref 0–44)
AST: 19 U/L (ref 15–41)
Albumin: 3 g/dL — ABNORMAL LOW (ref 3.5–5.0)
Alkaline Phosphatase: 97 U/L (ref 38–126)
Anion gap: 16 — ABNORMAL HIGH (ref 5–15)
BUN: 67 mg/dL — ABNORMAL HIGH (ref 6–20)
CO2: 27 mmol/L (ref 22–32)
Calcium: 9.9 mg/dL (ref 8.9–10.3)
Chloride: 85 mmol/L — ABNORMAL LOW (ref 98–111)
Creatinine, Ser: 6.13 mg/dL — ABNORMAL HIGH (ref 0.44–1.00)
GFR, Estimated: 8 mL/min — ABNORMAL LOW (ref >60)
Glucose, Bld: 146 mg/dL — ABNORMAL HIGH (ref 70–99)
Potassium: 4.6 mmol/L (ref 3.5–5.1)
Sodium: 128 mmol/L — ABNORMAL LOW (ref 135–145)
Total Bilirubin: 0.6 mg/dL (ref 0.3–1.2)
Total Protein: 8.1 g/dL (ref 6.5–8.1)

## 2021-09-29 LAB — MAGNESIUM: Magnesium: 2.4 mg/dL (ref 1.7–2.4)

## 2021-09-29 LAB — PHOSPHORUS: Phosphorus: 3.4 mg/dL (ref 2.5–4.6)

## 2021-09-29 NOTE — Progress Notes (Signed)
SLP Cancellation Note  Patient Details Name: Janet Mitchell MRN: [id number] DOB: September 29, 1965   Cancelled treatment:        Pt currently in HD. Will continue efforts.    [name] 09/29/2021, 2:46 PM

## 2021-09-29 NOTE — Progress Notes (Signed)
Progress Note  Patient: Marketta Valadez Pinnix YKD:[id number] DOB: 1966/03/18  DOA: 08/23/2021  DOS: 09/29/2021    Brief hospital course: Brief Narrative (HPI from H&P)  56 y.o. female with history of ESRD on HD MWF, CHF, poorly controlled HTN, DM presenting with nausea, vomiting, left side weakness present upon waking.  Scented to [facility] on 08/23/2021 with nausea vomiting left-sided weakness, work-up showed right-sided thalamic hemorrhage she was intubated and transferred to [facility] under ICU care for further treatment.  She was seen by [facility], nephrology for HD, neurology and neurosurgery, external ventricular drain placement by neurosurgery on 08/23/2021.  She also was intubated, trached, during her hospital stay she had tracheostomy site infection for which she completed antibiotic therapy. Functional status has improved to the point that she has tolerated hemodialysis in recliner on 7/5. Lurline Idol site has been decannulated and PEG tube placed 6/22. She is medically stable for discharge to SNF, no longer requiring LTAC level of care.     Events procedures   5/28 > Presents to [facility] ED, intubated. 5/29 > Transferred to [facility] Echocardiogram with EF 60%, no acute changes. 5/30 > HD, more awake, following commands with RUE/RLE 6/1 Extubated 6/2 Reintubated. Trached. 6/4 Placed on ATC yesterday afternoon and has tolerated well since 6/5: EVD raised to 20 cm H20 6/7: Failed EVD clamping trial after developing emesis and headache 6/8: Failed EVD clamping due to emesis 6/9: Increase EVD pressure to 25 6/12 back on vent due to hypoxia from mucus plugging, started back on antibiotics 6/14 VP shunt placed. Antibiotics adjusted based on cultures 6/17 No longer vent-dependent  6/19.  Transferred to hospitalist service 09/16/2021 PEG tube placed by IR Dr. Anselm Pancoast 6/28 decannulated Attempting placement in LTAC, no beds available 7/5: Tolerating hemodialysis in chair.  Seeking placement at SNF.     Assessment and Plan: Intracranial hemorrhage: Head CT 5/29 with 5 cc acute hematoma in R thalamus with intraventricular extension. Hemorrhage was thought due to uncontrolled hypertension.  Brain bleed contributing to severe left-sided weakness, dysphagia along with toxic encephalopathy - S/p external ventricular drain placement by neurosurgery on 5/29 - Subsequent VP shunt placement 6/14 by neurosurgery - Avoid anticoagulation and SBP >119mHg.  - s/p PEG, plan for SNF rehabilitation   Acute hypoxic respiratory failure: Requiring vent-dependence and tracheostomy now s/p decannulation 6/28, stabilized s/p antibiotics for treatment of Enterobacter and Staph aureus, with antibiotic stop date 09/15/2021 per PCCM team.   - Note IR suspicion of aspiration during PEG tube placement 6/22. Monitor for evidence of recurrent aspiration. S/p 5 more days abx w/unasyn 6/23 - 6/27. Continue scopolamine patch.    Dysphagia:  - Continue nutritional support thru PEG placed 6/22.  - Continue dysphagia 1, thin liquids, meds crushed in puree per SLP.    ESRD:  - Continue HD in chair as of 7/5, schedule MWF.    Essential hypertension: Aim to maintain SBP <1675mg - Continue coreg and norvasc at lowered doses. At goal.    Anemia of chronic disease. Elevated ferritin, normal/elevated folic acid, B1L97 - ESA as directed by nephrology.  - Monitor intermittently.    T2DM: HbA1c 6.2%. - Continue SSI  Obesity: Estimated body mass index is 34.17 kg/m as calculated from the following:   Height as of this encounter: '5\' 7"'$  (1.702 m).   Weight as of this encounter: 99 kg.   Subjective: Seen in HD without complaints. No pain, nausea, vomiting, witnessed aspiration, or shortness of breath  Objective: Vitals:  09/29/21 1000 09/29/21 1038 09/29/21 1100 09/29/21 1130  BP: 105/81 (!) 160/79 116/90 (!) 131/114  Pulse: 80 78 78 79  Resp: '15 16 13 14  '$ Temp: 98.1 F (36.7 C)     TempSrc: Oral     SpO2: 99% 100%  100% 96%  Weight:      Height:       Gen: 56 y.o. female in no distress Pulm: Nonlabored breathing room air. Trach site with eschar, decannulated. Neck held in extension. CV: Regular rate and rhythm. II/VI SEM without other murmur, rub, or gallop. No JVD, no pitting dependent edema. GI: Abdomen soft, non-tender, non-distended, with normoactive bowel sounds.  Ext: Warm, no deformities. LUE AVF accessed. Skin: No acute rashes, lesions or ulcers on visualized skin. PEG site c/d/i. Neuro: Alert, interactive, left hemiplegia with increased tone. Some decreased strength on right as well.    Data Personally reviewed: CBC: Recent Labs  Lab 09/25/21 0730 09/26/21 0251 09/27/21 0208 09/28/21 0047 09/29/21 0218  WBC 5.2 5.4 4.9 4.9 5.0  NEUTROABS 2.7 2.9 2.5 2.6 2.5  HGB 9.6* 9.6* 9.9* 11.2* 11.7*  HCT 31.2* 31.0* 30.5* 36.7 37.0  MCV 80.2 80.9 79.4* 81.7 79.7*  PLT 126* 135* 131* 143* 161*   Basic Metabolic Panel: Recent Labs  Lab 09/25/21 0730 09/26/21 0251 09/27/21 0208 09/28/21 0047 09/29/21 0218  NA 132* 132* 130* 133* 128*  K 4.1 4.2 4.2 4.2 4.6  CL 90* 87* 89* 91* 85*  CO2 '28 28 29 29 27  '$ GLUCOSE 154* 186* 135* 176* 146*  BUN 43* 65* 88* 42* 67*  CREATININE 4.55* 6.26* 7.68* 4.45* 6.13*  CALCIUM 9.7 10.0 9.8 9.9 9.9  MG 2.0 2.2 2.4 2.1 2.4  PHOS 3.9 4.4 4.3 3.1 3.4   GFR: Estimated Creatinine Clearance: 12.8 mL/min (A) (by C-G formula based on SCr of 6.13 mg/dL (H)). Liver Function Tests: Recent Labs  Lab 09/24/21 0219 09/25/21 0730 09/26/21 0251 09/27/21 0208 09/28/21 0047 09/29/21 0218  AST 22  --  '19 15 18 19  '$ ALT 7  --  '7 9 9 9  '$ ALKPHOS 85  --  81 91 102 97  BILITOT 0.5  --  0.6 0.4 0.5 0.6  PROT 7.8  --  7.5 7.6 8.2* 8.1  ALBUMIN 3.1* 2.9* 3.0* 3.0* 3.1* 3.0*   CBG: Recent Labs  Lab 09/28/21 2331 09/28/21 2355 09/29/21 0353 09/29/21 0755 09/29/21 1006  GLUCAP 169* 161* 157* 178* 116*   Urine analysis:    Component Value Date/Time    COLORURINE AMBER (A) 07/31/2018 1800   APPEARANCEUR TURBID (A) 07/31/2018 1800   LABSPEC >1.046 (H) 07/31/2018 1800   PHURINE 5.0 07/31/2018 1800   GLUCOSEU 150 (A) 07/31/2018 1800   HGBUR SMALL (A) 07/31/2018 1800   BILIRUBINUR NEGATIVE 07/31/2018 1800   KETONESUR NEGATIVE 07/31/2018 1800   PROTEINUR >=300 (A) 07/31/2018 1800   NITRITE NEGATIVE 07/31/2018 1800   LEUKOCYTESUR MODERATE (A) 07/31/2018 1800   Family Communication: None at bedside  Disposition: Status is: Inpatient Remains inpatient appropriate because: Unsafe DC, requires SNF level of care Planned Discharge Destination: Skilled nursing facility  Patrecia Pour, MD 09/29/2021 11:58 AM Page by [email or web address]

## 2021-09-29 NOTE — Progress Notes (Signed)
Nutrition Follow-up  DOCUMENTATION CODES:  Obesity unspecified  INTERVENTION:  Continue current diet as ordered per SLP  Continue continuous tube feeds via PEG: - Nepro @ 50 ml/hr (1200 ml/day) - ProSource TF 45 ml daily - Tube feeding regimen provides 2164 kcal, 108 grams of protein, and 872 ml of H2O.   - RD will monitor PO intake and for ability to transition to nocturnal tube feeding regimen  If nocturnal tube feeding regimen becomes appropriate, recommend: - Nepro @ 65 ml/hr x 12 hours from 6 PM to 6 AM (total of 780 ml) - ProSource TF 45 ml BID - Recommended nocturnal tube feeding regimen would provide 1460 kcal, 85 grams of protein, and 567 ml of H2O (meets 77% of minimum kcal needs and 85% of minimum protein needs).  NUTRITION DIAGNOSIS:  Inadequate oral intake related to inability to eat (pt sedated and ventilated) as evidenced by NPO status. - Progressing, pt now on dysphagia 1 diet with thin liquids  GOAL:  Patient will meet greater than or equal to 90% of their needs - Met via continuous TF  MONITOR:  PO intake, Labs, Weight trends, TF tolerance, I & O's  REASON FOR ASSESSMENT:  Ventilator    ASSESSMENT:  56 y/o female with h/o ESRD on HD, HTN, DM, COVID 19 (03/2019) and CHF who is admitted with right thalamus ICH with IVH and obstructive hydrocephalus s/p right frontal ventriculostomy with EVD placement 5/29.  05/29 - s/p EVD 06/02 - s/p trach and Cortrak placement  06/14 - s/p VP shunt 06/22 - s/p PEG tube placement 06/23 - TF rate decreased to 35 ml/hr per MD 06/24 - TF rate increased to 45 ml/hr per MD 06/27 - TF rate increased to goal of 50 ml/hr per MD, s/p MBS with diet advanced to dysphagia 1 with thin liquids 7/4 - SLP evaluation, diet advanced to DYS 3 diet  Pt out of room for HD at the time of assessment. Poor oral intake since diet was advanced, not a candidate for adjustment to nocturnal tube feeds yet. Will continue to monitor. Noted pt was  advanced to DYS3 diet, may have better intake with upgraded texture.  Admit weight: 104.5 kg Current weight: 99 kg  Average Meal Intake: 6/27-7/5: 25% intake x 8 recorded meals  Nutritionally Relevant Medications: Scheduled Meds:  calcitRIOL  1 mcg Per Tube Q M,W,F   PROSource TF  45 mL Per Tube Daily   fiber  1 packet Per Tube BID   insulin aspart  0-9 Units Subcutaneous Q4H   multivitamin  1 tablet Per Tube QHS   pantoprazole sodium  40 mg Per Tube Daily   sevelamer carbonate  0.8 g Per Tube TID WC   Continuous Infusions:  NEPRO CARB STEADY 1,000 mL (09/28/21 0501)   PRN Meds: ondansetron, senna-docusate  Labs Reviewed: Na 128, chloride 85 BUN 67, creatinine 6.13 CBG ranges from 116-194 mg/dL over the last 24 hours  Diet Order:   Diet Order             DIET DYS 3 Room service appropriate? Yes; Fluid consistency: Thin  Diet effective now                   EDUCATION NEEDS:  No education needs have been identified at this time  Skin:  Skin Assessment: Skin Integrity Issues: Incisions: head, abdomen Other: eschar L great toe amputation site  Last BM:  09/20/21 type 6  Height:  Ht Readings from Last 1  Encounters:  09/09/21 _0  (1.702 m)   Weight:  Wt Readings from Last 1 Encounters:  09/29/21 99 kg   Ideal Body Weight:  61.3 kg  BMI:  Body mass index is 34.17 kg/m.  Estimated Nutritional Needs:  Kcal:  1900-2200kcal/day Protein:  100-120 Fluid:  1L+UOP   Ranell Patrick, RD, LDN Clinical Dietitian RD pager # available in [facility]  After hours/weekend pager # available in [facility]

## 2021-09-29 NOTE — Progress Notes (Signed)
Received patient in bed, alert and oriented. Informed consent signed and in chart.  Time tx completed:1429  HD treatment completed. Patient tolerated well. Fistula/Graft/HD catheter without signs and symptoms of complications. Patient transported back to the room, alert and orient and in no acute distress. Report given to bedside RN.  Total UF removed: 90.5  Medication given:None  Post HD VS: See chart  Post HD weight: In a chair, weight unavailable.    Pt completed prescribed tx, tolerated well.

## 2021-09-29 NOTE — Procedures (Signed)
I was present at this dialysis session. I have reviewed the session itself and made appropriate changes.  Pt currently tolerating HD in recliner, so may be able to transfer to SNF instead of LTAC.   Vital signs in last 24 hours:  Temp:  [97.7 F (36.5 C)-98.6 F (37 C)] 98.6 F (37 C) (07/05 0739) Pulse Rate:  [76-80] 80 (07/05 0739) Resp:  [17] 17 (07/05 0739) BP: (111-158)/(65-95) 150/78 (07/05 0739) SpO2:  [96 %-100 %] 100 % (07/05 0739) Weight:  [99 kg] 99 kg (07/05 0500) Weight change: 3.357 kg Filed Weights   09/27/21 1326 09/28/21 0500 09/29/21 0500  Weight: 94.5 kg 95.3 kg 99 kg    Recent Labs  Lab 09/29/21 0218  NA 128*  K 4.6  CL 85*  CO2 27  GLUCOSE 146*  BUN 67*  CREATININE 6.13*  CALCIUM 9.9  PHOS 3.4    Recent Labs  Lab 09/27/21 0208 09/28/21 0047 09/29/21 0218  WBC 4.9 4.9 5.0  NEUTROABS 2.5 2.6 2.5  HGB 9.9* 11.2* 11.7*  HCT 30.5* 36.7 37.0  MCV 79.4* 81.7 79.7*  PLT 131* 143* 145*    Scheduled Meds:  amantadine  200 mg Per Tube Weekly   amLODipine  2.5 mg Oral Daily   calcitRIOL  1 mcg Per Tube Q M,W,F   carvedilol  3.125 mg Per NG tube BID WC   chlorhexidine  15 mL Mouth Rinse BID   Chlorhexidine Gluconate Cloth  6 each Topical Q0600   darbepoetin (ARANESP) injection - DIALYSIS  100 mcg Intravenous Q Fri-HD   feeding supplement (PROSource TF)  45 mL Per Tube Daily   fiber  1 packet Per Tube BID   heparin injection (subcutaneous)  5,000 Units Subcutaneous Q8H   insulin aspart  0-9 Units Subcutaneous Q4H   multivitamin  1 tablet Per Tube QHS   mouth rinse  15 mL Mouth Rinse 4 times per day   pantoprazole sodium  40 mg Per Tube Daily   scopolamine  1 patch Transdermal Q72H   sevelamer carbonate  0.8 g Per Tube TID WC   Continuous Infusions:  sodium chloride 0 mL/hr at 09/06/21 1141   feeding supplement (NEPRO CARB STEADY) 1,000 mL (09/28/21 0501)   PRN Meds:.acetaminophen **OR** acetaminophen (TYLENOL) oral liquid 160 mg/5 mL **OR**  acetaminophen, fentaNYL (SUBLIMAZE) injection, Gerhardt's butt cream, ipratropium-albuterol, labetalol, ondansetron (ZOFRAN) IV, mouth rinse, polyvinyl alcohol, senna-docusate   Janet Potts,  MD 09/29/2021, 10:02 AM

## 2021-09-30 DIAGNOSIS — R1310 Dysphagia, unspecified: Secondary | ICD-10-CM | POA: Diagnosis not present

## 2021-09-30 DIAGNOSIS — N186 End stage renal disease: Secondary | ICD-10-CM | POA: Diagnosis not present

## 2021-09-30 DIAGNOSIS — G911 Obstructive hydrocephalus: Secondary | ICD-10-CM | POA: Diagnosis not present

## 2021-09-30 DIAGNOSIS — I629 Nontraumatic intracranial hemorrhage, unspecified: Secondary | ICD-10-CM | POA: Diagnosis not present

## 2021-09-30 LAB — GLUCOSE, CAPILLARY
Glucose-Capillary: 115 mg/dL — ABNORMAL HIGH (ref 70–99)
Glucose-Capillary: 128 mg/dL — ABNORMAL HIGH (ref 70–99)
Glucose-Capillary: 156 mg/dL — ABNORMAL HIGH (ref 70–99)
Glucose-Capillary: 172 mg/dL — ABNORMAL HIGH (ref 70–99)
Glucose-Capillary: 175 mg/dL — ABNORMAL HIGH (ref 70–99)
Glucose-Capillary: 181 mg/dL — ABNORMAL HIGH (ref 70–99)

## 2021-09-30 MED ORDER — AMLODIPINE BESYLATE 2.5 MG PO TABS
2.5000 mg | ORAL_TABLET | Freq: Every day | ORAL | Status: DC
  Administered 2021-09-30 – 2021-10-05 (×6): 2.5 mg
  Filled 2021-09-30 (×6): qty 1

## 2021-09-30 MED ORDER — CARVEDILOL 3.125 MG PO TABS
3.1250 mg | ORAL_TABLET | Freq: Two times a day (BID) | ORAL | Status: DC
Start: 2021-09-30 — End: 2021-10-05
  Administered 2021-09-30 – 2021-10-05 (×9): 3.125 mg
  Filled 2021-09-30 (×9): qty 1

## 2021-09-30 NOTE — TOC Progression Note (Signed)
Transition of Care [facility]) - Progression Note    Patient Details  Name: Janet Mitchell MRN: [id number] Date of Birth: 1965-12-13  Transition of Care [facility]) CM/SW [place], [place] Phone Number: 09/30/2021, 12:02 PM  Clinical Narrative:    CSW spoke with MD who notified CSW that pt is medically ready to DC. CSW spoke with pt's spouse and he noted he is agreeable to [place] as they are the only offer at this time, and will not need to switch HD centers. CSW started authorization, still pending at this time. TOC will continue to follow for DC needs.   Expected Discharge Plan: [place] Barriers to Discharge: Continued Medical Work up, Ship broker  Expected Discharge Plan and Services Expected Discharge Plan: [place] In-house Referral: Clinical Social Work Discharge Planning Services: CM Consult   Living arrangements for the past 2 months: [place] Determinants of Health (SDOH) Interventions    Readmission Risk Interventions    07/19/2019    1:50 PM 07/12/2019    4:57 PM 04/05/2019    2:18 PM  Readmission Risk Prevention Plan  Transportation Screening Complete Complete Complete  Medication Review (RN Care Manager) Complete  Complete  PCP or Specialist appointment within 3-5 days of discharge Patient refused    [place] or [place] Complete    Palliative Care Screening Not Applicable  Not [place] Not Applicable Not Applicable Patient Refused

## 2021-09-30 NOTE — Progress Notes (Signed)
Speech Language Pathology Treatment: Cognitive-Linquistic  Patient Details Name: Janet Mitchell MRN: [id number] DOB: September 25, 1965 Today's Date: 09/30/2021 Time: 6237-6283 SLP Time Calculation (min) (ACUTE ONLY): 20 min  Assessment / Plan / Recommendation Clinical Impression  Patient seen by SLP for skilled therapy session focused on cognitive-linguistic abilities as patient politely declining PO's at this time. Patient was oriented to "I am in the hospital", oriented to city and state, oriented to year but stated month as "June". When asked the name of the hospital, she replied, "Let me think for a minute" and after a delay of less than a minute, she said "[facility]". She was not able to recall what the doctor said to her about going to a SNF for rehab and he had just been in her room about 15 minutes prior to this session. She acted as though she did not know she had been decannulated. When told about her progress she said, "Im sure its there, Im just not seeing it". She had difficulty stating specific deficits she has noticed she has, but eventually was able to say "remembering names" and "putting it all together". SLP plans to continue seeing patient for dysphagia and cognitive-linguistic therapies while on acute and recommend SNF level SLP interventions upon discharge.    HPI HPI: Patient is a 56 y.o. female who presented to [facility] ER as a code stroke with c/o nausea and vomiting upon waking up in the morning along with left sided weakness. She was communicative at [facility] but during stay awaiting transfer to [facility] she required emergent intubation due to inability to protect airway. 6/1 CXR showed Diffuse bilateral lung opacities may be due to pulmonary edema or multifocal pneumonia. MRI brain showed right thalamic intraparenchymal hemorrhage  with surrounding edema. She was extubated on 6/1 and although initially tolerated well, quickly was seen with poor, ineffective cough, copious  secretions unable to clear independently but in no obvious distress and oxygen saturations 100% on 4L nasal cannula. She was reintubated on 6/2 to tracheostomy #6 cuffed [place].  Decannulated 6/28. Pt with PMH: ESRD on HD MWF, CHF, HTN, DM.      SLP Plan  Continue with current plan of care      Recommendations for follow up therapy are one component of a multi-disciplinary discharge planning process, led by the attending physician.  Recommendations may be updated based on patient status, additional functional criteria and insurance authorization.    Recommendations                   Follow Up Recommendations: Skilled nursing-short term rehab (<3 hours/day) Assistance recommended at discharge: Frequent or constant Supervision/Assistance SLP Visit Diagnosis: Cognitive communication deficit (R41.841);Aphasia (R47.01) Plan: Continue with current plan of care           Sonia Baller, MA, CCC-SLP Speech Therapy

## 2021-09-30 NOTE — Progress Notes (Signed)
Progress Note  Patient: Janet Mitchell ZOX:[id number] DOB: 03/04/1966  DOA: 08/23/2021  DOS: 09/30/2021    Brief hospital course: Brief Narrative (HPI from H&P)  56 y.o. female with history of ESRD on HD MWF, CHF, poorly controlled HTN, DM presenting with nausea, vomiting, left side weakness present upon waking.  Scented to [facility] on 08/23/2021 with nausea vomiting left-sided weakness, work-up showed right-sided thalamic hemorrhage she was intubated and transferred to [facility] under ICU care for further treatment.  She was seen by [facility], nephrology for HD, neurology and neurosurgery, external ventricular drain placement by neurosurgery on 08/23/2021.  She also was intubated, trached, during her hospital stay she had tracheostomy site infection for which she completed antibiotic therapy. Functional status has improved to the point that she has tolerated hemodialysis in recliner on 7/5. Lurline Idol site has been decannulated and PEG tube placed 6/22. She is medically stable for discharge to SNF, no longer requiring LTAC level of care.     Events procedures   5/28 > Presents to [facility] ED, intubated. 5/29 > Transferred to [facility] Echocardiogram with EF 60%, no acute changes. 5/30 > HD, more awake, following commands with RUE/RLE 6/1 Extubated 6/2 Reintubated. Trached. 6/4 Placed on ATC yesterday afternoon and has tolerated well since 6/5: EVD raised to 20 cm H20 6/7: Failed EVD clamping trial after developing emesis and headache 6/8: Failed EVD clamping due to emesis 6/9: Increase EVD pressure to 25 6/12 back on vent due to hypoxia from mucus plugging, started back on antibiotics 6/14 VP shunt placed. Antibiotics adjusted based on cultures 6/17 No longer vent-dependent  6/19.  Transferred to hospitalist service 09/16/2021 PEG tube placed by IR Dr. Anselm Pancoast 6/28 decannulated Attempting placement in LTAC, no beds available 7/5: Tolerating hemodialysis in chair.  Seeking placement at SNF.     Assessment and Plan: Intracranial hemorrhage: Head CT 5/29 with 5 cc acute hematoma in R thalamus with intraventricular extension. Hemorrhage was thought due to uncontrolled hypertension.  Brain bleed contributing to severe left-sided weakness, dysphagia along with toxic encephalopathy - S/p external ventricular drain placement by neurosurgery on 5/29 - Subsequent VP shunt placement 6/14 by neurosurgery. Follow up with neurosurgery recommended. - Avoid anticoagulation and SBP >124mHg.  - s/p PEG, plan for SNF rehabilitation   Acute hypoxic respiratory failure: Requiring vent-dependence and tracheostomy now s/p decannulation 6/28, stabilized s/p antibiotics for treatment of Enterobacter and Staph aureus, with antibiotic stop date 09/15/2021 per PCCM team.   - Note IR suspicion of aspiration during PEG tube placement 6/22. Monitor for evidence of recurrent aspiration. S/p 5 more days abx w/unasyn 6/23 - 6/27. Continue scopolamine patch.    Dysphagia:  - Continue nutritional support thru PEG placed 6/22.  - Continue dysphagia 1, thin liquids, meds crushed in puree per SLP.    ESRD:  - Continue HD in chair as of 7/5, schedule MWF. Will keep same HD spot at DC to SNF.    Essential hypertension: Aim to maintain SBP <1651mg - Continue coreg and norvasc at lowered doses. At goal.    Anemia of chronic disease. Elevated ferritin, normal/elevated folic acid, B1W11 - ESA as directed by nephrology.  - Monitor intermittently.    T2DM: HbA1c 6.2%. - Continue SSI  Obesity: Estimated body mass index is 33.6 kg/m as calculated from the following:   Height as of this encounter: '5\' 7"'$  (1.702 m).   Weight as of this encounter: 97.3 kg.   Subjective: Tolerated HD on chair yesterday, no  new complaints.   Objective: Vitals:   09/30/21 0403 09/30/21 0700 09/30/21 0833 09/30/21 1100  BP:  129/65 (!) 109/48 110/80  Pulse:  82  68  Resp:  16  16  Temp:  98.5 F (36.9 C)  98 F (36.7 C)  TempSrc:   Axillary  Axillary  SpO2:  98%  100%  Weight: 97.3 kg     Height:       Gen: 56 y.o. female in no distress Pulm: Nonlabored breathing room air. Eschar at previous trach site. CV: Regular rate and rhythm. II/VI SEM without other murmur, rub, or gallop. No JVD, no dependent edema. GI: Abdomen soft, non-tender, non-distended, with normoactive bowel sounds.  Ext: Warm, dry Skin: No new rashes, lesions or ulcers on visualized skin. LEFT AVF +T/B Neuro: Alert, interactive, oscillating right leg then arm, able to voluntarily suppress, still left hemiplegia and leftward lean in upper body. No new focal neurological deficits.  Data Personally reviewed: CBC: Recent Labs  Lab 09/25/21 0730 09/26/21 0251 09/27/21 0208 09/28/21 0047 09/29/21 0218  WBC 5.2 5.4 4.9 4.9 5.0  NEUTROABS 2.7 2.9 2.5 2.6 2.5  HGB 9.6* 9.6* 9.9* 11.2* 11.7*  HCT 31.2* 31.0* 30.5* 36.7 37.0  MCV 80.2 80.9 79.4* 81.7 79.7*  PLT 126* 135* 131* 143* 329*   Basic Metabolic Panel: Recent Labs  Lab 09/25/21 0730 09/26/21 0251 09/27/21 0208 09/28/21 0047 09/29/21 0218  NA 132* 132* 130* 133* 128*  K 4.1 4.2 4.2 4.2 4.6  CL 90* 87* 89* 91* 85*  CO2 '28 28 29 29 27  '$ GLUCOSE 154* 186* 135* 176* 146*  BUN 43* 65* 88* 42* 67*  CREATININE 4.55* 6.26* 7.68* 4.45* 6.13*  CALCIUM 9.7 10.0 9.8 9.9 9.9  MG 2.0 2.2 2.4 2.1 2.4  PHOS 3.9 4.4 4.3 3.1 3.4   GFR: Estimated Creatinine Clearance: 12.7 mL/min (A) (by C-G formula based on SCr of 6.13 mg/dL (H)). Liver Function Tests: Recent Labs  Lab 09/24/21 0219 09/25/21 0730 09/26/21 0251 09/27/21 0208 09/28/21 0047 09/29/21 0218  AST 22  --  '19 15 18 19  '$ ALT 7  --  '7 9 9 9  '$ ALKPHOS 85  --  81 91 102 97  BILITOT 0.5  --  0.6 0.4 0.5 0.6  PROT 7.8  --  7.5 7.6 8.2* 8.1  ALBUMIN 3.1* 2.9* 3.0* 3.0* 3.1* 3.0*   CBG: Recent Labs  Lab 09/29/21 2022 09/29/21 2318 09/30/21 0356 09/30/21 0751 09/30/21 1137  GLUCAP 157* 164* 172* 181* 128*   Family Communication:  None at bedside  Disposition: Status is: Inpatient Remains inpatient appropriate because: Unsafe DC, requires SNF level of care Planned Discharge Destination: Skilled nursing facility  Patrecia Pour, MD 09/30/2021 1:15 PM Page by [email or web address]

## 2021-09-30 NOTE — Progress Notes (Signed)
Patient ID: Janet Mitchell, female   DOB: April 29, 1965, 56 y.o.   MRN: [id number] S: Able to tolerate HD in recliner yesterday.  No complaints. O:BP (!) 109/48   Pulse 82   Temp 98.5 F (36.9 C) (Axillary)   Resp 16   Ht '5\' 7"'$  (1.702 m)   Wt 97.3 kg   SpO2 98%   BMI 33.60 kg/m   Intake/Output Summary (Last 24 hours) at 09/30/2021 1016 Last data filed at 09/29/2021 1700 Gross per 24 hour  Intake 550 ml  Output 8750 ml  Net -8200 ml   Intake/Output: I/O last 3 completed shifts: In: 1150 [NG/GT:1150] Out: 8750 [Other:8750]  Intake/Output this shift:  No intake/output data recorded. Weight change: -1.678 kg Gen: NAD CVS: RRR Resp: CTA Abd: +BS, soft, NT/ND Ext: no edema, LUE AVF +T/ B  Recent Labs  Lab 09/24/21 0219 09/25/21 0730 09/26/21 0251 09/27/21 0208 09/28/21 0047 09/29/21 0218  NA 134* 132* 132* 130* 133* 128*  K 3.9 4.1 4.2 4.2 4.2 4.6  CL 88* 90* 87* 89* 91* 85*  CO2 '28 28 28 29 29 27  '$ GLUCOSE 154* 154* 186* 135* 176* 146*  BUN 55* 43* 65* 88* 42* 67*  CREATININE 6.19* 4.55* 6.26* 7.68* 4.45* 6.13*  ALBUMIN 3.1* 2.9* 3.0* 3.0* 3.1* 3.0*  CALCIUM 10.0 9.7 10.0 9.8 9.9 9.9  PHOS 5.6* 3.9 4.4 4.3 3.1 3.4  AST 22  --  '19 15 18 19  '$ ALT 7  --  '7 9 9 9   '$ Liver Function Tests: Recent Labs  Lab 09/27/21 0208 09/28/21 0047 09/29/21 0218  AST '15 18 19  '$ ALT '9 9 9  '$ ALKPHOS 91 102 97  BILITOT 0.4 0.5 0.6  PROT 7.6 8.2* 8.1  ALBUMIN 3.0* 3.1* 3.0*   No results for input(s): "LIPASE", "AMYLASE" in the last 168 hours. No results for input(s): "AMMONIA" in the last 168 hours. CBC: Recent Labs  Lab 09/25/21 0730 09/26/21 0251 09/27/21 0208 09/28/21 0047 09/29/21 0218  WBC 5.2 5.4 4.9 4.9 5.0  NEUTROABS 2.7 2.9 2.5 2.6 2.5  HGB 9.6* 9.6* 9.9* 11.2* 11.7*  HCT 31.2* 31.0* 30.5* 36.7 37.0  MCV 80.2 80.9 79.4* 81.7 79.7*  PLT 126* 135* 131* 143* 145*   Cardiac Enzymes: No results for input(s): "CKTOTAL", "CKMB", "CKMBINDEX", "TROPONINI" in the last 168  hours. CBG: Recent Labs  Lab 09/29/21 1633 09/29/21 2022 09/29/21 2318 09/30/21 0356 09/30/21 0751  GLUCAP 174* 157* 164* 172* 181*    Iron Studies: No results for input(s): "IRON", "TIBC", "TRANSFERRIN", "FERRITIN" in the last 72 hours. Studies/Results: No results found.  amantadine  200 mg Per Tube Weekly   amLODipine  2.5 mg Per Tube Daily   calcitRIOL  1 mcg Per Tube Q M,W,F   carvedilol  3.125 mg Per Tube BID WC   chlorhexidine  15 mL Mouth Rinse BID   Chlorhexidine Gluconate Cloth  6 each Topical Q0600   feeding supplement (PROSource TF)  45 mL Per Tube Daily   fiber  1 packet Per Tube BID   heparin injection (subcutaneous)  5,000 Units Subcutaneous Q8H   insulin aspart  0-9 Units Subcutaneous Q4H   multivitamin  1 tablet Per Tube QHS   mouth rinse  15 mL Mouth Rinse 4 times per day   pantoprazole sodium  40 mg Per Tube Daily   scopolamine  1 patch Transdermal Q72H   sevelamer carbonate  0.8 g Per Tube TID WC    BMET  Component Value Date/Time   NA 128 (L) 09/29/2021 0218   K 4.6 09/29/2021 0218   CL 85 (L) 09/29/2021 0218   CO2 27 09/29/2021 0218   GLUCOSE 146 (H) 09/29/2021 0218   BUN 67 (H) 09/29/2021 0218   CREATININE 6.13 (H) 09/29/2021 0218   CREATININE 5.02 (H) 08/29/2019 1506   CALCIUM 9.9 09/29/2021 0218   GFRNONAA 8 (L) 09/29/2021 0218   GFRNONAA 9 (L) 08/29/2019 1506   GFRAA 11 (L) 08/29/2019 1506   CBC    Component Value Date/Time   WBC 5.0 09/29/2021 0218   RBC 4.64 09/29/2021 0218   HGB 11.7 (L) 09/29/2021 0218   HGB 11.3 05/06/2021 1352   HCT 37.0 09/29/2021 0218   HCT 36.2 05/06/2021 1352   PLT 145 (L) 09/29/2021 0218   PLT 101 (L) 05/06/2021 1352   MCV 79.7 (L) 09/29/2021 0218   MCV 76 (L) 05/06/2021 1352   MCH 25.2 (L) 09/29/2021 0218   MCHC 31.6 09/29/2021 0218   RDW 18.6 (H) 09/29/2021 0218   RDW 18.0 (H) 05/06/2021 1352   LYMPHSABS 1.5 09/29/2021 0218   LYMPHSABS 1.5 05/06/2021 1352   MONOABS 0.8 09/29/2021 0218   EOSABS  0.2 09/29/2021 0218   EOSABS 0.3 05/06/2021 1352   BASOSABS 0.0 09/29/2021 0218   BASOSABS 0.1 05/06/2021 1352    Dialysis Orders: [facility] MWF  4h  97.5kg  450/500  2/2.5 bath  15ga  AVF  Hep none - mircera 100 ug q2 - rocaltrol 1.0 mcg po tiw   Assessment/Plan: Right thalamic intracranial hemorrhage - w/ intraventricular hemorrhage and hydrocephalus. Underwent ventriculostomy drain placement, then VP shunt surgery 6/14 per neurosurgery.  Resp failure - trach removed 6/28.  ESRD - gets HD MWF and will continue with schedule.  Able to sit in recliner for entire HD session on 09/29/21.  HTN/volume  - is under dry wt, getting tube feeds, euvolemic, getting low dose norvasc and coreg. BP's good.  Anemia CKD - got aranesp on 6/17 at 100 mcg off schedule.  Is ordered for 100 mcg weekly on Fridays.  Hb 9.6. Tsat 16 but ferritin 1400, will hold off on IV Fe for now.  CKD-MBD - Calcium elevated, phos at goal. On calcitriol. We transitioned to renvela due to hypercalcemia while here.  Improved. Only 2.5 calcium dialysate available here.  On nepro.  Atrial fib - per primary team Disposition - trach was dc'd, she could be dc'd to SNF since she was able to have HD on 09/29/21 in a recliner.    Donetta Potts, MD [facility]

## 2021-09-30 NOTE — Plan of Care (Signed)
  Problem: Nutrition: Goal: Risk of aspiration will decrease Outcome: Progressing   Problem: Education: Goal: Knowledge of General Education information will improve Description: Including pain rating scale, medication(s)/side effects and non-pharmacologic comfort measures Outcome: Progressing   Problem: Clinical Measurements: Goal: Respiratory complications will improve Outcome: Progressing

## 2021-09-30 NOTE — Progress Notes (Signed)
Physical Therapy Treatment Patient Details Name: Janet Mitchell MRN: [id number] DOB: 04/29/1965 Today's Date: 09/30/2021   History of Present Illness 56 y.o. female presents to [facility] hospital as a transfer from [place] on 08/23/2021 with complaints of nausea, vomiting, L weakness. Pt required emergent intubation during transfer from [facility]. CTH reveals R thalamic hemorrhage with IVH. Pt underwent R frontal ventriculostomy on 5/29. Extubated 6/1, reintubated 6/2, tracheostomy 6/2. Pt failed EVD clamping and VP shunt placed 6/14. PMH includes HTN, CHF, MDII, ESRD.    PT Comments     Pt diagonal with R foot hanging off bed.  She remains to present with L lateral lean and poor ability to maintain midline.  Pt continues to benefit from SNF placement at this time.     Recommendations for follow up therapy are one component of a multi-disciplinary discharge planning process, led by the attending physician.  Recommendations may be updated based on patient status, additional functional criteria and insurance authorization.  Follow Up Recommendations  Skilled nursing-short term rehab (<3 hours/day)     Assistance Recommended at Discharge Frequent or constant Supervision/Assistance  Patient can return home with the following Two people to help with walking and/or transfers;Two people to help with bathing/dressing/bathroom;Assistance with cooking/housework;Assistance with feeding;Direct supervision/assist for medications management;Direct supervision/assist for financial management;Assist for transportation;Help with stairs or ramp for entrance   Equipment Recommendations  Hospital bed;Other (comment);Wheelchair (measurements PT);Wheelchair cushion (measurements PT) (mechanical lift and tilt in space WC with lateral trunk supports and head supports.)    Recommendations for Other Services Rehab consult     Precautions / Restrictions Precautions Precautions: Fall Precaution Comments: dense L  hemi, VP shunt, PEG, vertigo Restrictions Weight Bearing Restrictions: No     Mobility  Bed Mobility Overal bed mobility: Needs Assistance             General bed mobility comments: Performed modified bringing with attempted muscle engagement.  Pt able reach with RUE to pull on head board to boost into supine.    Pt then required positioning with pillows more on L side due to lateral lean.  PTA placed pillows under B arms and in chiar position.  Pt has difficulty maintaining midline.    Transfers                        Ambulation/Gait                   Stairs             Wheelchair Mobility    Modified Rankin (Stroke Patients Only)       Balance Overall balance assessment: Needs assistance Sitting-balance support: No upper extremity supported, Feet supported Sitting balance-Leahy Scale: Poor   Postural control: Other (comment), Left lateral lean                                  Cognition Arousal/Alertness: Lethargic, Suspect due to medications Behavior During Therapy: WFL for tasks assessed/performed Overall Cognitive Status: Difficult to assess                                 General Comments: Pt responds to name, initially attempting to follow commands.  She then started closing her eyes and started sleeping with intermittent cues to wake up but became less alert so session ended.  Exercises General Exercises - Lower Extremity Long Arc Quad: AAROM, PROM, Both, 10 reps, Supine (AAROM on R and passive on L.) Other Exercises Other Exercises: attempted B elbow flexion and shoulder flexion but falling asleep so unable to continue.    General Comments        Pertinent Vitals/Pain Pain Assessment Pain Assessment: No/denies pain    Home Living                          Prior Function            PT Goals (current goals can now be found in the care plan section) Acute Rehab PT  Goals Patient Stated Goal: none stated Potential to Achieve Goals: Poor Progress towards PT goals: Progressing toward goals    Frequency    Min 2X/week      PT Plan Current plan remains appropriate;Frequency needs to be updated    Co-evaluation              AM-PAC PT "6 Clicks" Mobility   Outcome Measure  Help needed turning from your back to your side while in a flat bed without using bedrails?: Total Help needed moving from lying on your back to sitting on the side of a flat bed without using bedrails?: Total Help needed moving to and from a bed to a chair (including a wheelchair)?: Total Help needed standing up from a chair using your arms (e.g., wheelchair or bedside chair)?: Total Help needed to walk in hospital room?: Total Help needed climbing 3-5 steps with a railing? : Total 6 Click Score: 6    End of Session Equipment Utilized During Treatment: Oxygen Activity Tolerance: Patient tolerated treatment well Patient left: in bed;with call bell/phone within reach;with bed alarm set;Other (comment) (sitting up propped with pillows in chair position.) Nurse Communication: Mobility status;Need for lift equipment (d/c plan for snf and needs to be able to sit up in recliner for dialysis) PT Visit Diagnosis: Other abnormalities of gait and mobility (R26.89);Other symptoms and signs involving the nervous system (R29.898);Muscle weakness (generalized) (M62.81);Hemiplegia and hemiparesis Hemiplegia - Right/Left: Left Hemiplegia - dominant/non-dominant: Non-dominant Hemiplegia - caused by: Nontraumatic intracerebral hemorrhage     Time: 6378-5885 PT Time Calculation (min) (ACUTE ONLY): 19 min  Charges:  $Therapeutic Activity: 8-22 mins                     Erasmo Leventhal , PTA Acute Rehabilitation Services Office [phone]    Janet Mitchell 09/30/2021, 11:05 AM

## 2021-09-30 NOTE — TOC Progression Note (Addendum)
Transition of Care [facility]) - Progression Note    Patient Details  Name: Janet Mitchell MRN: [id number] Date of Birth: 07/24/1965  Transition of Care [facility]) CM/SW [place], [place] Phone Number: 09/30/2021, 10:22 AM  Clinical Narrative:    Pt is disoriented at this time, left VM for spouse to discuss disposition. Pt has one bed offer for [place], confirmed they are able to transport pt to her current HD site. Pt able to sit in chair for today's HD session. CSW will wait to confirm bed offer with spouse and start insurance authorization. TOC will continue to follow for DC needs.   Expected Discharge Plan: [place] Barriers to Discharge: Continued Medical Work up, Ship broker  Expected Discharge Plan and Services Expected Discharge Plan: [place] In-house Referral: Clinical Social Work Discharge Planning Services: CM Consult   Living arrangements for the past 2 months: [place] Determinants of Health (SDOH) Interventions    Readmission Risk Interventions    07/19/2019    1:50 PM 07/12/2019    4:57 PM 04/05/2019    2:18 PM  Readmission Risk Prevention Plan  Transportation Screening Complete Complete Complete  Medication Review (RN Care Manager) Complete  Complete  PCP or Specialist appointment within 3-5 days of discharge Patient refused    [place] or [place] Complete    Palliative Care Screening Not Applicable  Not [place] Not Applicable Not Applicable Patient Refused

## 2021-09-30 NOTE — Progress Notes (Signed)
Pt's chart reviewed. Noted pt able to tolerate HD in chair yesterday. Contacted [facility] and spoke to [place]. Advised clinic pt tolerated HD in chair yesterday and that CSW is working on snf placement at [facility]. Clinic requested clinical information to be faxed for review to make sure clinic can meet pt's needs. Clinicals faxed to clinic for review. Will await return call from clinic. Will provide update to CSW.   Melven Sartorius Renal Navigator [phone]

## 2021-10-01 ENCOUNTER — Encounter: Payer: 59 | Admitting: Podiatry

## 2021-10-01 LAB — CBC
HCT: 32.9 % — ABNORMAL LOW (ref 36.0–46.0)
Hemoglobin: 10.4 g/dL — ABNORMAL LOW (ref 12.0–15.0)
MCH: 24.9 pg — ABNORMAL LOW (ref 26.0–34.0)
MCHC: 31.6 g/dL (ref 30.0–36.0)
MCV: 78.9 fL — ABNORMAL LOW (ref 80.0–100.0)
Platelets: 144 10*3/uL — ABNORMAL LOW (ref 150–400)
RBC: 4.17 MIL/uL (ref 3.87–5.11)
RDW: 17.9 % — ABNORMAL HIGH (ref 11.5–15.5)
WBC: 4.5 10*3/uL (ref 4.0–10.5)
nRBC: 0 % (ref 0.0–0.2)

## 2021-10-01 LAB — RENAL FUNCTION PANEL
Albumin: 2.9 g/dL — ABNORMAL LOW (ref 3.5–5.0)
Anion gap: 15 (ref 5–15)
BUN: 71 mg/dL — ABNORMAL HIGH (ref 6–20)
CO2: 24 mmol/L (ref 22–32)
Calcium: 9.7 mg/dL (ref 8.9–10.3)
Chloride: 87 mmol/L — ABNORMAL LOW (ref 98–111)
Creatinine, Ser: 6.33 mg/dL — ABNORMAL HIGH (ref 0.44–1.00)
GFR, Estimated: 7 mL/min — ABNORMAL LOW (ref >60)
Glucose, Bld: 166 mg/dL — ABNORMAL HIGH (ref 70–99)
Phosphorus: 4 mg/dL (ref 2.5–4.6)
Potassium: 4.3 mmol/L (ref 3.5–5.1)
Sodium: 126 mmol/L — ABNORMAL LOW (ref 135–145)

## 2021-10-01 LAB — GLUCOSE, CAPILLARY
Glucose-Capillary: 135 mg/dL — ABNORMAL HIGH (ref 70–99)
Glucose-Capillary: 140 mg/dL — ABNORMAL HIGH (ref 70–99)
Glucose-Capillary: 151 mg/dL — ABNORMAL HIGH (ref 70–99)
Glucose-Capillary: 166 mg/dL — ABNORMAL HIGH (ref 70–99)
Glucose-Capillary: 181 mg/dL — ABNORMAL HIGH (ref 70–99)

## 2021-10-01 MED ORDER — NEPRO/CARBSTEADY PO LIQD
1000.0000 mL | ORAL | Status: DC
  Administered 2021-10-01 – 2021-10-03 (×4): 1000 mL
  Filled 2021-10-01 (×3): qty 1000

## 2021-10-01 MED ORDER — PROSOURCE TF PO LIQD
45.0000 mL | Freq: Two times a day (BID) | ORAL | Status: DC
  Administered 2021-10-01 – 2021-10-04 (×6): 45 mL
  Filled 2021-10-01 (×6): qty 45

## 2021-10-01 NOTE — Progress Notes (Addendum)
Contacted [facility] and spoke to [place]. Clinic is agreeable to accept pt back but will require that staff receive an in-service regarding pt with PEG due to pt having a new PEG. Janett Billow to return call to navigator to advise if in-service must be completed prior to pt resuming care or if in-service can be scheduled for the near future. Will await response from clinic. Update provided to attending, CSW, and renal PA.   Melven Sartorius Renal Navigator [phone]  Addendum at 11:28 am: Received a return call from [place] from [facility]. Clinic staff must receive training prior to pt resuming at clinic. Clinic is also awaiting an approval from infection control regarding pt's return to clinic as well. Per Janett Billow, clinic is planning staff training on Monday and hopefully will receive approval from infection control by then as well. Hopefully, pt will be able to resume at clinic on Wednesday. Update provided to attending, CSW, and renal PA.

## 2021-10-01 NOTE — TOC Progression Note (Signed)
Transition of Care [facility]) - Progression Note    Patient Details  Name: Janet Mitchell MRN: [id number] Date of Birth: 1965-07-02  Transition of Care [facility]) CM/SW [place], [place] Phone Number: 10/01/2021, 5:51 PM  Clinical Narrative:     CSW was advised that pt DC will be held due to outpatient HD barriers. Facility notified about DC possible for early next week and they stated they will have a bed available when pt is ready. Authorization has been approved  [id number] 7/7- 7/11.  Expected Discharge Plan: Skilled Nursing Facility Barriers to Discharge: Continued Medical Work up, Ship broker  Expected Discharge Plan and Services Expected Discharge Plan: [place] In-house Referral: Clinical Social Work Discharge Planning Services: CM Consult   Living arrangements for the past 2 months: Single Family Home Expected Discharge Date: 10/01/21                                     Social Determinants of Health (SDOH) Interventions    Readmission Risk Interventions    07/19/2019    1:50 PM 07/12/2019    4:57 PM 04/05/2019    2:18 PM  Readmission Risk Prevention Plan  Transportation Screening Complete Complete Complete  Medication Review (RN Care Manager) Complete  Complete  PCP or Specialist appointment within 3-5 days of discharge Patient refused    [place] or [place] Complete    Palliative Care Screening Not Applicable  Not [place] Not Applicable Not Applicable Patient Refused

## 2021-10-01 NOTE — Procedures (Signed)
I was present at this dialysis session. I have reviewed the session itself and made appropriate changes.  Sitting in recliner and tolerating it well.  Vital signs in last 24 hours:  Temp:  [97.6 F (36.4 C)-98.7 F (37.1 C)] 97.8 F (36.6 C) (07/07 0947) Pulse Rate:  [68-80] 78 (07/07 1000) Resp:  [14-21] 21 (07/07 1000) BP: (98-214)/(55-176) 106/55 (07/07 1000) SpO2:  [92 %-100 %] 100 % (07/07 1000) Weight:  [97.2 kg] 97.2 kg (07/07 0947) Weight change: -0.097 kg Filed Weights   09/30/21 0403 10/01/21 0402 10/01/21 0947  Weight: 97.3 kg 97.2 kg 97.2 kg    Recent Labs  Lab 10/01/21 0922  NA 126*  K 4.3  CL 87*  CO2 24  GLUCOSE 166*  BUN 71*  CREATININE 6.33*  CALCIUM 9.7  PHOS 4.0    Recent Labs  Lab 09/27/21 0208 09/28/21 0047 09/29/21 0218 10/01/21 0922  WBC 4.9 4.9 5.0 4.5  NEUTROABS 2.5 2.6 2.5  --   HGB 9.9* 11.2* 11.7* 10.4*  HCT 30.5* 36.7 37.0 32.9*  MCV 79.4* 81.7 79.7* 78.9*  PLT 131* 143* 145* 144*    Scheduled Meds:  amantadine  200 mg Per Tube Weekly   amLODipine  2.5 mg Per Tube Daily   calcitRIOL  1 mcg Per Tube Q M,W,F   carvedilol  3.125 mg Per Tube BID WC   chlorhexidine  15 mL Mouth Rinse BID   Chlorhexidine Gluconate Cloth  6 each Topical Q0600   feeding supplement (PROSource TF)  45 mL Per Tube Daily   fiber  1 packet Per Tube BID   heparin injection (subcutaneous)  5,000 Units Subcutaneous Q8H   insulin aspart  0-9 Units Subcutaneous Q4H   multivitamin  1 tablet Per Tube QHS   mouth rinse  15 mL Mouth Rinse 4 times per day   pantoprazole sodium  40 mg Per Tube Daily   scopolamine  1 patch Transdermal Q72H   sevelamer carbonate  0.8 g Per Tube TID WC   Continuous Infusions:  sodium chloride 0 mL/hr at 09/06/21 1141   feeding supplement (NEPRO CARB STEADY) 1,000 mL (09/30/21 2336)   PRN Meds:.[DISCONTINUED] acetaminophen **OR** acetaminophen (TYLENOL) oral liquid 160 mg/5 mL **OR** [DISCONTINUED] acetaminophen, Gerhardt's butt  cream, ipratropium-albuterol, labetalol, ondansetron (ZOFRAN) IV, mouth rinse, polyvinyl alcohol, senna-docusate   Donetta Potts,  MD 10/01/2021, 10:18 AM

## 2021-10-01 NOTE — Progress Notes (Signed)
Progress Note  Patient: Janet Mitchell KDX:[id number] DOB: 05/13/1965  DOA: 08/23/2021  DOS: 10/01/2021    Brief hospital course: Brief Narrative (HPI from H&P)  56 y.o. female with history of ESRD on HD MWF, CHF, poorly controlled HTN, DM presenting with nausea, vomiting, left side weakness present upon waking.  Scented to [facility] on 08/23/2021 with nausea vomiting left-sided weakness, work-up showed right-sided thalamic hemorrhage she was intubated and transferred to [facility] under ICU care for further treatment.  She was seen by [facility], nephrology for HD, neurology and neurosurgery, external ventricular drain placement by neurosurgery on 08/23/2021.  She also was intubated, trached, during her hospital stay she had tracheostomy site infection for which she completed antibiotic therapy. Functional status has improved to the point that she has tolerated hemodialysis in recliner on 7/5. Lurline Idol site has been decannulated and PEG tube placed 6/22. She is medically stable for discharge to SNF, no longer requiring LTAC level of care.     Events procedures   5/28 > Presents to [facility] ED, intubated. 5/29 > Transferred to [facility] Echocardiogram with EF 60%, no acute changes. 5/30 > HD, more awake, following commands with RUE/RLE 6/1 Extubated 6/2 Reintubated. Trached. 6/4 Placed on ATC yesterday afternoon and has tolerated well since 6/5: EVD raised to 20 cm H20 6/7: Failed EVD clamping trial after developing emesis and headache 6/8: Failed EVD clamping due to emesis 6/9: Increase EVD pressure to 25 6/12 back on vent due to hypoxia from mucus plugging, started back on antibiotics 6/14 VP shunt placed. Antibiotics adjusted based on cultures 6/17 No longer vent-dependent  6/19.  Transferred to hospitalist service 09/16/2021 PEG tube placed by IR Dr. Anselm Pancoast 6/28 decannulated Attempting placement in LTAC, no beds available 7/5: Tolerating hemodialysis in chair.  Seeking placement at SNF.    Assessment and Plan: Intracranial hemorrhage: Head CT 5/29 with 5 cc acute hematoma in R thalamus with intraventricular extension. Hemorrhage was thought due to uncontrolled hypertension.  Brain bleed contributing to severe left-sided weakness, dysphagia along with toxic encephalopathy - S/p external ventricular drain placement by neurosurgery on 5/29 - Subsequent VP shunt placement 6/14 by neurosurgery. Follow up with neurosurgery recommended. - Avoid anticoagulation and SBP >164mHg. prn labetalol ordered. - s/p PEG, plan for SNF rehabilitation   Acute hypoxic respiratory failure: Requiring vent-dependence and tracheostomy now s/p decannulation 6/28, stabilized s/p antibiotics for treatment of Enterobacter and Staph aureus, with antibiotic stop date 09/15/2021 per PCCM team.   - Note IR suspicion of aspiration during PEG tube placement 6/22. Monitor for evidence of recurrent aspiration. S/p 5 more days abx w/unasyn 6/23 - 6/27. Continue scopolamine patch.    Dysphagia:  - Continue nutritional support thru PEG placed 6/22. Will reconsult RD for recommendations for changing to bolus feeds/nocturnal feeds.  - Continue dysphagia 3, thin liquids, meds crushed in puree per SLP.    ESRD:  - Continue HD in chair as of 7/5, schedule MWF. Will keep same HD spot at DC to SNF.    Essential hypertension: Aim to maintain SBP <1630mg - Continue coreg and norvasc at lowered doses. At goal.    Anemia of chronic disease. Elevated ferritin, normal/elevated folic acid, B1Z76 - ESA as directed by nephrology.  - Monitor intermittently.    T2DM: HbA1c 6.2%. - Continue SSI  Obesity: Estimated body mass index is 33.56 kg/m as calculated from the following:   Height as of this encounter: '5\' 7"'$  (1.702 m).   Weight as of this  encounter: 97.2 kg.   Subjective: No new complaints.   Objective: Vitals:   10/01/21 1030 10/01/21 1105 10/01/21 1134 10/01/21 1158  BP: (!) 124/109 (!) 131/48 (!) 144/111 (!)  123/111  Pulse: 80 80 100 78  Resp: '12 14 15 14  '$ Temp:      TempSrc:      SpO2: 99% 91% 96% 99%  Weight:      Height:      Gen: female in no distress seen in HD Pulm: Nonlabored breathing room air. Clear. CV: Regular rate and rhythm. II/VI systolic unchanged murmur, no rub, or gallop. No JVD, no significant dependent edema. GI: Abdomen soft, non-tender, non-distended, with normoactive bowel sounds. PEG site c/d/i Ext: Warm, Right sided movements are voluntary but constant, left hemiparesis is stable. Skin: No new rashes, lesions or ulcers on visualized skin. Neuro: Alert and oriented. No new focal neurological deficits. Psych: Judgement and insight appear fair. Mood euthymic & affect congruent. Behavior is appropriate.     Data Personally reviewed: CBC: Recent Labs  Lab 09/25/21 0730 09/26/21 0251 09/27/21 0208 09/28/21 0047 09/29/21 0218 10/01/21 0922  WBC 5.2 5.4 4.9 4.9 5.0 4.5  NEUTROABS 2.7 2.9 2.5 2.6 2.5  --   HGB 9.6* 9.6* 9.9* 11.2* 11.7* 10.4*  HCT 31.2* 31.0* 30.5* 36.7 37.0 32.9*  MCV 80.2 80.9 79.4* 81.7 79.7* 78.9*  PLT 126* 135* 131* 143* 145* 284*   Basic Metabolic Panel: Recent Labs  Lab 09/25/21 0730 09/26/21 0251 09/27/21 0208 09/28/21 0047 09/29/21 0218 10/01/21 0922  NA 132* 132* 130* 133* 128* 126*  K 4.1 4.2 4.2 4.2 4.6 4.3  CL 90* 87* 89* 91* 85* 87*  CO2 '28 28 29 29 27 24  '$ GLUCOSE 154* 186* 135* 176* 146* 166*  BUN 43* 65* 88* 42* 67* 71*  CREATININE 4.55* 6.26* 7.68* 4.45* 6.13* 6.33*  CALCIUM 9.7 10.0 9.8 9.9 9.9 9.7  MG 2.0 2.2 2.4 2.1 2.4  --   PHOS 3.9 4.4 4.3 3.1 3.4 4.0   GFR: Estimated Creatinine Clearance: 12.3 mL/min (A) (by C-G formula based on SCr of 6.33 mg/dL (H)). Liver Function Tests: Recent Labs  Lab 09/26/21 0251 09/27/21 0208 09/28/21 0047 09/29/21 0218 10/01/21 0922  AST '19 15 18 19  '$ --   ALT '7 9 9 9  '$ --   ALKPHOS 81 91 102 97  --   BILITOT 0.6 0.4 0.5 0.6  --   PROT 7.5 7.6 8.2* 8.1  --   ALBUMIN 3.0*  3.0* 3.1* 3.0* 2.9*   CBG: Recent Labs  Lab 09/30/21 1524 09/30/21 1952 09/30/21 2334 10/01/21 0332 10/01/21 0746  GLUCAP 115* 175* 156* 151* 140*   Family Communication: None at bedside  Disposition: Status is: Inpatient Remains inpatient appropriate because: Unsafe DC, requires SNF level of care and confirmation of outpatient hemodialysis. Renal navigator confirmed their outpatient HD unit will require infection prevention clearance and an in-service on PEG tube management which will not be available until Monday, so first hemodialysis as an outpatient wouldn't be available until Wednesday, 7/12.  Planned Discharge Destination: Skilled nursing facility Monday, 7/10, after hemodialysis  Patrecia Pour, MD 10/01/2021 12:35 PM Page by [email or web address]

## 2021-10-01 NOTE — Plan of Care (Signed)
  Problem: Nutrition: Goal: Risk of aspiration will decrease Outcome: Progressing   

## 2021-10-01 NOTE — Progress Notes (Signed)
Brief Nutrition Note  New consult received: "Recommendations sought regarding change to bolus feeds and/or nocturnal feeds based on current oral intake."  Pt currently on continuous feeds via PEG and diet was further upgraded to a DYS 3 since last full assessment 7/5.  Currently, pt's recorded PO intake is poor with intake ranging from 0-50% with the majority being <35% consumed. No intake has been recorded since 7/5 in pt's flowsheet. Unsure if pt is meeting needs or if intake has improved with diet advancement.   Will plan to adjust pt's TF regimen to nocturnal to meet ~75% of pt's needs and initiate a calorie count over the weekend to determine if pt is able to meet needs orally. If pt is unable to meet needs orally, will adjust to bolus feeds as pt will likely be discharged next week per CM notes.   Recommend the following to be initiated tonight: - Nepro @ 65 ml/hr x 12 hours from 6 PM to 6 AM (total of 780 ml) - ProSource TF 45 ml BID - Recommended nocturnal tube feeding regimen would provide 1460 kcal, 85 grams of protein, and 567 ml of H2O (meets 77% of minimum kcal needs and 85% of minimum protein needs).  If pt's intake is poor, plan to transition to bolus feeds at the conclusion of calorie count. Recommend the following: 280m cartons of Nepro 5x/d Flush with 342mof water before and after each bolus feed This will provide 2097kcal, 96g of protein, and 116146mf freewater (TF+flush)   Janet Mitchell, LDN Clinical Dietitian RD pager # available in [facility]/weekend pager # available in [facility]

## 2021-10-02 LAB — GLUCOSE, CAPILLARY
Glucose-Capillary: 101 mg/dL — ABNORMAL HIGH (ref 70–99)
Glucose-Capillary: 148 mg/dL — ABNORMAL HIGH (ref 70–99)
Glucose-Capillary: 151 mg/dL — ABNORMAL HIGH (ref 70–99)
Glucose-Capillary: 157 mg/dL — ABNORMAL HIGH (ref 70–99)
Glucose-Capillary: 164 mg/dL — ABNORMAL HIGH (ref 70–99)
Glucose-Capillary: 179 mg/dL — ABNORMAL HIGH (ref 70–99)

## 2021-10-02 NOTE — Progress Notes (Signed)
Progress Note  Patient: Janet Mitchell JEH:[id number] DOB: 05-Nov-1965  DOA: 08/23/2021  DOS: 10/02/2021    Brief hospital course: Brief Narrative (HPI from H&P)  56 y.o. female with history of ESRD on HD MWF, CHF, poorly controlled HTN, DM presenting with nausea, vomiting, left side weakness present upon waking.  Scented to [facility] on 08/23/2021 with nausea vomiting left-sided weakness, work-up showed right-sided thalamic hemorrhage she was intubated and transferred to [facility] under ICU care for further treatment.  She was seen by [facility], nephrology for HD, neurology and neurosurgery, external ventricular drain placement by neurosurgery on 08/23/2021.  She also was intubated, trached, during her hospital stay she had tracheostomy site infection for which she completed antibiotic therapy. Functional status has improved to the point that she has tolerated hemodialysis in recliner on 7/5. Lurline Idol site has been decannulated and PEG tube placed 6/22. She is medically stable for discharge to SNF, no longer requiring LTAC level of care.     Events procedures   5/28 > Presents to [facility] ED, intubated. 5/29 > Transferred to [facility] Echocardiogram with EF 60%, no acute changes. 5/30 > HD, more awake, following commands with RUE/RLE 6/1 Extubated 6/2 Reintubated. Trached. 6/4 Placed on ATC yesterday afternoon and has tolerated well since 6/5: EVD raised to 20 cm H20 6/7: Failed EVD clamping trial after developing emesis and headache 6/8: Failed EVD clamping due to emesis 6/9: Increase EVD pressure to 25 6/12 back on vent due to hypoxia from mucus plugging, started back on antibiotics 6/14 VP shunt placed. Antibiotics adjusted based on cultures 6/17 No longer vent-dependent  6/19.  Transferred to hospitalist service 09/16/2021 PEG tube placed by IR Dr. Anselm Pancoast 6/28 decannulated Attempting placement in LTAC, no beds available 7/5: Tolerating hemodialysis in chair.  Seeking placement at SNF.    Assessment and Plan: Intracranial hemorrhage: Head CT 5/29 with 5 cc acute hematoma in R thalamus with intraventricular extension. Hemorrhage was thought due to uncontrolled hypertension.  Brain bleed contributing to severe left-sided weakness, dysphagia along with toxic encephalopathy - S/p external ventricular drain placement by neurosurgery on 5/29 - Subsequent VP shunt placement 6/14 by neurosurgery. Follow up with neurosurgery recommended. - Avoid anticoagulation and SBP >134mHg. prn labetalol ordered. - s/p PEG, plan for SNF rehabilitation   Acute hypoxic respiratory failure: Requiring vent-dependence and tracheostomy now s/p decannulation 6/28, stabilized s/p antibiotics for treatment of Enterobacter and Staph aureus. - Note IR suspicion of aspiration during PEG tube placement 6/22. Monitor for evidence of recurrent aspiration. S/p 5 more days abx w/unasyn 6/23 - 6/27. Continue scopolamine patch.  - We will trial nocturnal feeds, pursue calorie count to inform needs going forward.   Dysphagia:  - Continue nutritional support thru PEG placed 6/22. Will reconsult RD for recommendations for changing to bolus feeds/nocturnal feeds.  - Continue dysphagia 3, thin liquids, meds crushed in puree per SLP.    ESRD:  - Continue HD in chair as of 7/5, schedule MWF. Will keep same HD spot at DC to SNF.    Essential hypertension: Aim to maintain SBP <1643mg - Continue coreg and norvasc at lowered doses. At goal.    Anemia of chronic disease. Elevated ferritin, normal/elevated folic acid, B1V78 - ESA as directed by nephrology.  - Monitor intermittently.   Hyponatremia: Managed with HD   T2DM: HbA1c 6.2%. - Continue SSI  Thrombocytopenia: Stable  Obesity: Estimated body mass index is 33.99 kg/m as calculated from the following:   Height as of  this encounter: '5\' 7"'$  (1.702 m).   Weight as of this encounter: 98.4 kg.   Subjective: Has not been eating much, but is willing to push this a  bit this weekend to transition more toward bolus/nocturnal feeds.  Objective: Vitals:   10/02/21 0600 10/02/21 0814 10/02/21 1151 10/02/21 1649  BP:  (!) 158/115 (!) 168/81 96/73  Pulse:  83 79 79  Resp:  '18 16 18  '$ Temp:  98.9 F (37.2 C) 98.6 F (37 C) 98.7 F (37.1 C)  TempSrc:  Oral Oral Oral  SpO2:  95% 95% 97%  Weight: 98.4 kg     Height:      Gen: 56 y.o. female in no distress Pulm: Nonlabored breathing room air. Clear. CV: Regular rate and rhythm. II/VI systolic unchanged murmur, rub, or gallop. No JVD, no dependent edema. GI: Abdomen soft, non-tender, non-distended, with normoactive bowel sounds.  Ext: Warm, dry Skin: No rashes, lesions or ulcers on visualized skin. Neuro: Alert and cooperative, has stable focal neurological deficits.  Data Personally reviewed: CBC: Recent Labs  Lab 09/26/21 0251 09/27/21 0208 09/28/21 0047 09/29/21 0218 10/01/21 0922  WBC 5.4 4.9 4.9 5.0 4.5  NEUTROABS 2.9 2.5 2.6 2.5  --   HGB 9.6* 9.9* 11.2* 11.7* 10.4*  HCT 31.0* 30.5* 36.7 37.0 32.9*  MCV 80.9 79.4* 81.7 79.7* 78.9*  PLT 135* 131* 143* 145* 737*   Basic Metabolic Panel: Recent Labs  Lab 09/26/21 0251 09/27/21 0208 09/28/21 0047 09/29/21 0218 10/01/21 0922  NA 132* 130* 133* 128* 126*  K 4.2 4.2 4.2 4.6 4.3  CL 87* 89* 91* 85* 87*  CO2 '28 29 29 27 24  '$ GLUCOSE 186* 135* 176* 146* 166*  BUN 65* 88* 42* 67* 71*  CREATININE 6.26* 7.68* 4.45* 6.13* 6.33*  CALCIUM 10.0 9.8 9.9 9.9 9.7  MG 2.2 2.4 2.1 2.4  --   PHOS 4.4 4.3 3.1 3.4 4.0   GFR: Estimated Creatinine Clearance: 12.4 mL/min (A) (by C-G formula based on SCr of 6.33 mg/dL (H)). Liver Function Tests: Recent Labs  Lab 09/26/21 0251 09/27/21 0208 09/28/21 0047 09/29/21 0218 10/01/21 0922  AST '19 15 18 19  '$ --   ALT '7 9 9 9  '$ --   ALKPHOS 81 91 102 97  --   BILITOT 0.6 0.4 0.5 0.6  --   PROT 7.5 7.6 8.2* 8.1  --   ALBUMIN 3.0* 3.0* 3.1* 3.0* 2.9*   CBG: Recent Labs  Lab 10/01/21 2352  10/02/21 0356 10/02/21 0815 10/02/21 1153 10/02/21 1625  GLUCAP 135* 179* 157* 164* 101*   Family Communication: None at bedside  Disposition: Status is: Inpatient Remains inpatient appropriate because: Unsafe DC, requires SNF level of care and confirmation of outpatient hemodialysis. Renal navigator confirmed their outpatient HD unit will require infection prevention clearance and an in-service on PEG tube management which will not be available until Monday, so first hemodialysis as an outpatient wouldn't be available until Wednesday, 7/12.  Planned Discharge Destination: Skilled nursing facility Monday, 7/10, after hemodialysis  Patrecia Pour, MD 10/02/2021 5:04 PM Page by [email or web address]

## 2021-10-02 NOTE — Progress Notes (Signed)
Patient ID: Janet Mitchell, female   DOB: [birth year], 56 y.o.   MRN: [id number] S: No complaints.  Tolerated HD in recliner again yesterday. O:BP (!) 158/115 (BP Location: Right Wrist)   Pulse 83   Temp 98.9 F (37.2 C) (Oral)   Resp 18   Ht '5\' 7"'$  (1.702 m)   Wt 98.4 kg   SpO2 95%   BMI 33.99 kg/m   Intake/Output Summary (Last 24 hours) at 10/02/2021 0953 Last data filed at 10/01/2021 1933 Gross per 24 hour  Intake --  Output 0 ml  Net 0 ml   Intake/Output: I/O last 3 completed shifts: In: 1306.7 [NG/GT:1306.7] Out: 0   Intake/Output this shift:  No intake/output data recorded. Weight change: 0 kg Gen: NAD CVS: RRR Resp:CTA Abd: +BS, soft, NT/ND Ext: no edema, LUE AVF +T/B  Recent Labs  Lab 09/26/21 0251 09/27/21 0208 09/28/21 0047 09/29/21 0218 10/01/21 0922  NA 132* 130* 133* 128* 126*  K 4.2 4.2 4.2 4.6 4.3  CL 87* 89* 91* 85* 87*  CO2 '28 29 29 27 24  '$ GLUCOSE 186* 135* 176* 146* 166*  BUN 65* 88* 42* 67* 71*  CREATININE 6.26* 7.68* 4.45* 6.13* 6.33*  ALBUMIN 3.0* 3.0* 3.1* 3.0* 2.9*  CALCIUM 10.0 9.8 9.9 9.9 9.7  PHOS 4.4 4.3 3.1 3.4 4.0  AST '19 15 18 19  '$ --   ALT '7 9 9 9  '$ --    Liver Function Tests: Recent Labs  Lab 09/27/21 0208 09/28/21 0047 09/29/21 0218 10/01/21 0922  AST '15 18 19  '$ --   ALT '9 9 9  '$ --   ALKPHOS 91 102 97  --   BILITOT 0.4 0.5 0.6  --   PROT 7.6 8.2* 8.1  --   ALBUMIN 3.0* 3.1* 3.0* 2.9*   No results for input(s): "LIPASE", "AMYLASE" in the last 168 hours. No results for input(s): "AMMONIA" in the last 168 hours. CBC: Recent Labs  Lab 09/26/21 0251 09/27/21 0208 09/28/21 0047 09/29/21 0218 10/01/21 0922  WBC 5.4 4.9 4.9 5.0 4.5  NEUTROABS 2.9 2.5 2.6 2.5  --   HGB 9.6* 9.9* 11.2* 11.7* 10.4*  HCT 31.0* 30.5* 36.7 37.0 32.9*  MCV 80.9 79.4* 81.7 79.7* 78.9*  PLT 135* 131* 143* 145* 144*   Cardiac Enzymes: No results for input(s): "CKTOTAL", "CKMB", "CKMBINDEX", "TROPONINI" in the last 168 hours. CBG: Recent Labs   Lab 10/01/21 1703 10/01/21 1933 10/01/21 2352 10/02/21 0356 10/02/21 0815  GLUCAP 166* 181* 135* 179* 157*    Iron Studies: No results for input(s): "IRON", "TIBC", "TRANSFERRIN", "FERRITIN" in the last 72 hours. Studies/Results: No results found.  amantadine  200 mg Per Tube Weekly   amLODipine  2.5 mg Per Tube Daily   calcitRIOL  1 mcg Per Tube Q M,W,F   carvedilol  3.125 mg Per Tube BID WC   chlorhexidine  15 mL Mouth Rinse BID   Chlorhexidine Gluconate Cloth  6 each Topical Q0600   feeding supplement (NEPRO CARB STEADY)  1,000 mL Per Tube Q24H   feeding supplement (PROSource TF)  45 mL Per Tube BID   fiber  1 packet Per Tube BID   heparin injection (subcutaneous)  5,000 Units Subcutaneous Q8H   insulin aspart  0-9 Units Subcutaneous Q4H   multivitamin  1 tablet Per Tube QHS   mouth rinse  15 mL Mouth Rinse 4 times per day   pantoprazole sodium  40 mg Per Tube Daily   scopolamine  1 patch  Transdermal Q72H   sevelamer carbonate  0.8 g Per Tube TID WC    BMET    Component Value Date/Time   NA 126 (L) 10/01/2021 0922   K 4.3 10/01/2021 0922   CL 87 (L) 10/01/2021 0922   CO2 24 10/01/2021 0922   GLUCOSE 166 (H) 10/01/2021 0922   BUN 71 (H) 10/01/2021 0922   CREATININE 6.33 (H) 10/01/2021 0922   CREATININE 5.02 (H) 08/29/2019 1506   CALCIUM 9.7 10/01/2021 0922   GFRNONAA 7 (L) 10/01/2021 0922   GFRNONAA 9 (L) 08/29/2019 1506   GFRAA 11 (L) 08/29/2019 1506   CBC    Component Value Date/Time   WBC 4.5 10/01/2021 0922   RBC 4.17 10/01/2021 0922   HGB 10.4 (L) 10/01/2021 0922   HGB 11.3 05/06/2021 1352   HCT 32.9 (L) 10/01/2021 0922   HCT 36.2 05/06/2021 1352   PLT 144 (L) 10/01/2021 0922   PLT 101 (L) 05/06/2021 1352   MCV 78.9 (L) 10/01/2021 0922   MCV 76 (L) 05/06/2021 1352   MCH 24.9 (L) 10/01/2021 0922   MCHC 31.6 10/01/2021 0922   RDW 17.9 (H) 10/01/2021 0922   RDW 18.0 (H) 05/06/2021 1352   LYMPHSABS 1.5 09/29/2021 0218   LYMPHSABS 1.5 05/06/2021  1352   MONOABS 0.8 09/29/2021 0218   EOSABS 0.2 09/29/2021 0218   EOSABS 0.3 05/06/2021 1352   BASOSABS 0.0 09/29/2021 0218   BASOSABS 0.1 05/06/2021 1352    Dialysis Orders: [facility] MWF  4h  97.5kg  450/500  2/2.5 bath  15ga  AVF  Hep none - mircera 100 ug q2 - rocaltrol 1.0 mcg po tiw   Assessment/Plan: Right thalamic intracranial hemorrhage - w/ intraventricular hemorrhage and hydrocephalus. Underwent ventriculostomy drain placement, then VP shunt surgery 6/14 per neurosurgery.  Resp failure - trach removed 6/28.  ESRD - gets HD MWF and will continue with schedule.  Able to sit in recliner for entire HD session on 09/29/21 and 10/01/21.  HTN/volume  - is under dry wt, getting tube feeds, euvolemic, getting low dose norvasc and coreg. BP's good.  Anemia CKD - got aranesp on 6/17 at 100 mcg off schedule.  Is ordered for 100 mcg weekly on Fridays.  Hb 9.6. Tsat 16 but ferritin 1400, will hold off on IV Fe for now.  CKD-MBD - Calcium elevated, phos at goal. On calcitriol. We transitioned to renvela due to hypercalcemia while here.  Improved. Only 2.5 calcium dialysate available here.  On nepro.  Atrial fib - per primary team Disposition - trach was dc'd, she could be dc'd to SNF since she was able to have HD on 09/29/21 in a recliner.      Donetta Potts, MD [facility]

## 2021-10-03 LAB — GLUCOSE, CAPILLARY
Glucose-Capillary: 142 mg/dL — ABNORMAL HIGH (ref 70–99)
Glucose-Capillary: 142 mg/dL — ABNORMAL HIGH (ref 70–99)
Glucose-Capillary: 146 mg/dL — ABNORMAL HIGH (ref 70–99)
Glucose-Capillary: 155 mg/dL — ABNORMAL HIGH (ref 70–99)
Glucose-Capillary: 156 mg/dL — ABNORMAL HIGH (ref 70–99)
Glucose-Capillary: 181 mg/dL — ABNORMAL HIGH (ref 70–99)

## 2021-10-03 NOTE — Plan of Care (Signed)
  Problem: Activity: Goal: Risk for activity intolerance will decrease Outcome: Progressing   Problem: Nutrition: Goal: Adequate nutrition will be maintained Outcome: Progressing   Problem: Coping: Goal: Level of anxiety will decrease Outcome: Progressing   Problem: Safety: Goal: Ability to remain free from injury will improve Outcome: Progressing   Problem: Skin Integrity: Goal: Risk for impaired skin integrity will decrease Outcome: Progressing   

## 2021-10-03 NOTE — Progress Notes (Signed)
Patient ID: Janet Mitchell, female   DOB: 02-23-1966, 56 y.o.   MRN: [id number] S: No new complaints. O:BP (!) 151/73 (BP Location: Right Arm)   Pulse 72   Temp 98.6 F (37 C) (Oral)   Resp 18   Ht '5\' 7"'$  (1.702 m)   Wt 100.6 kg   SpO2 92%   BMI 34.72 kg/m   Intake/Output Summary (Last 24 hours) at 10/03/2021 1005 Last data filed at 10/03/2021 0600 Gross per 24 hour  Intake 2992.75 ml  Output 0 ml  Net 2992.75 ml   Intake/Output: I/O last 3 completed shifts: In: 2992.8 [Other:160; NG/GT:2832.8] Out: 0   Intake/Output this shift:  No intake/output data recorded. Weight change: 3.363 kg Gen: NAD CVS: RRR Resp:CTA Abd: +BS, soft, NT/Nd Ext: no edema, LUE AVF +T/B  Recent Labs  Lab 09/27/21 0208 09/28/21 0047 09/29/21 0218 10/01/21 0922  NA 130* 133* 128* 126*  K 4.2 4.2 4.6 4.3  CL 89* 91* 85* 87*  CO2 '29 29 27 24  '$ GLUCOSE 135* 176* 146* 166*  BUN 88* 42* 67* 71*  CREATININE 7.68* 4.45* 6.13* 6.33*  ALBUMIN 3.0* 3.1* 3.0* 2.9*  CALCIUM 9.8 9.9 9.9 9.7  PHOS 4.3 3.1 3.4 4.0  AST '15 18 19  '$ --   ALT '9 9 9  '$ --    Liver Function Tests: Recent Labs  Lab 09/27/21 0208 09/28/21 0047 09/29/21 0218 10/01/21 0922  AST '15 18 19  '$ --   ALT '9 9 9  '$ --   ALKPHOS 91 102 97  --   BILITOT 0.4 0.5 0.6  --   PROT 7.6 8.2* 8.1  --   ALBUMIN 3.0* 3.1* 3.0* 2.9*   No results for input(s): "LIPASE", "AMYLASE" in the last 168 hours. No results for input(s): "AMMONIA" in the last 168 hours. CBC: Recent Labs  Lab 09/27/21 0208 09/28/21 0047 09/29/21 0218 10/01/21 0922  WBC 4.9 4.9 5.0 4.5  NEUTROABS 2.5 2.6 2.5  --   HGB 9.9* 11.2* 11.7* 10.4*  HCT 30.5* 36.7 37.0 32.9*  MCV 79.4* 81.7 79.7* 78.9*  PLT 131* 143* 145* 144*   Cardiac Enzymes: No results for input(s): "CKTOTAL", "CKMB", "CKMBINDEX", "TROPONINI" in the last 168 hours. CBG: Recent Labs  Lab 10/02/21 1625 10/02/21 1940 10/02/21 2347 10/03/21 0349 10/03/21 0816  GLUCAP 101* 148* 151* 142* 156*     Iron Studies: No results for input(s): "IRON", "TIBC", "TRANSFERRIN", "FERRITIN" in the last 72 hours. Studies/Results: No results found.  amantadine  200 mg Per Tube Weekly   amLODipine  2.5 mg Per Tube Daily   calcitRIOL  1 mcg Per Tube Q M,W,F   carvedilol  3.125 mg Per Tube BID WC   chlorhexidine  15 mL Mouth Rinse BID   Chlorhexidine Gluconate Cloth  6 each Topical Q0600   feeding supplement (NEPRO CARB STEADY)  1,000 mL Per Tube Q24H   feeding supplement (PROSource TF)  45 mL Per Tube BID   fiber  1 packet Per Tube BID   heparin injection (subcutaneous)  5,000 Units Subcutaneous Q8H   insulin aspart  0-9 Units Subcutaneous Q4H   multivitamin  1 tablet Per Tube QHS   mouth rinse  15 mL Mouth Rinse 4 times per day   pantoprazole sodium  40 mg Per Tube Daily   scopolamine  1 patch Transdermal Q72H   sevelamer carbonate  0.8 g Per Tube TID WC    BMET    Component Value Date/Time   NA  126 (L) 10/01/2021 0922   K 4.3 10/01/2021 0922   CL 87 (L) 10/01/2021 0922   CO2 24 10/01/2021 0922   GLUCOSE 166 (H) 10/01/2021 0922   BUN 71 (H) 10/01/2021 0922   CREATININE 6.33 (H) 10/01/2021 0922   CREATININE 5.02 (H) 08/29/2019 1506   CALCIUM 9.7 10/01/2021 0922   GFRNONAA 7 (L) 10/01/2021 0922   GFRNONAA 9 (L) 08/29/2019 1506   GFRAA 11 (L) 08/29/2019 1506   CBC    Component Value Date/Time   WBC 4.5 10/01/2021 0922   RBC 4.17 10/01/2021 0922   HGB 10.4 (L) 10/01/2021 0922   HGB 11.3 05/06/2021 1352   HCT 32.9 (L) 10/01/2021 0922   HCT 36.2 05/06/2021 1352   PLT 144 (L) 10/01/2021 0922   PLT 101 (L) 05/06/2021 1352   MCV 78.9 (L) 10/01/2021 0922   MCV 76 (L) 05/06/2021 1352   MCH 24.9 (L) 10/01/2021 0922   MCHC 31.6 10/01/2021 0922   RDW 17.9 (H) 10/01/2021 0922   RDW 18.0 (H) 05/06/2021 1352   LYMPHSABS 1.5 09/29/2021 0218   LYMPHSABS 1.5 05/06/2021 1352   MONOABS 0.8 09/29/2021 0218   EOSABS 0.2 09/29/2021 0218   EOSABS 0.3 05/06/2021 1352   BASOSABS 0.0  09/29/2021 0218   BASOSABS 0.1 05/06/2021 1352      Dialysis Orders: [facility] MWF  4h  97.5kg  450/500  2/2.5 bath  15ga  AVF  Hep none - mircera 100 ug q2 - rocaltrol 1.0 mcg po tiw   Assessment/Plan: Right thalamic intracranial hemorrhage - w/ intraventricular hemorrhage and hydrocephalus. Underwent ventriculostomy drain placement, then VP shunt surgery 6/14 per neurosurgery.  Resp failure - trach removed 6/28.  ESRD - gets HD MWF and will continue with schedule.  Able to sit in recliner for entire HD session on 09/29/21 and 10/01/21.  HTN/volume  - is under dry wt, getting tube feeds, euvolemic, getting low dose norvasc and coreg. BP's good.  Anemia CKD - got aranesp on 6/17 at 100 mcg off schedule.  Is ordered for 100 mcg weekly on Fridays.  Hb 9.6. Tsat 16 but ferritin 1400, will hold off on IV Fe for now.  CKD-MBD - Calcium elevated, phos at goal. On calcitriol. We transitioned to renvela due to hypercalcemia while here.  Improved. Only 2.5 calcium dialysate available here.  On nepro.  Atrial fib - per primary team Disposition - trach was dc'd, she could be dc'd to SNF since she was able to have HD on 09/29/21 and 10/01/21 in a recliner.    Donetta Potts, MD [facility]

## 2021-10-03 NOTE — Progress Notes (Signed)
Progress Note  Patient: Janet Mitchell TAV:[id number] DOB: 02/14/1966  DOA: 08/23/2021  DOS: 10/03/2021    Brief hospital course: Brief Narrative (HPI from H&P)  56 y.o. female with history of ESRD on HD MWF, CHF, poorly controlled HTN, DM presenting with nausea, vomiting, left side weakness present upon waking.  Scented to [facility] on 08/23/2021 with nausea vomiting left-sided weakness, work-up showed right-sided thalamic hemorrhage she was intubated and transferred to [facility] under ICU care for further treatment.  She was seen by [facility], nephrology for HD, neurology and neurosurgery, external ventricular drain placement by neurosurgery on 08/23/2021.  She also was intubated, trached, during her hospital stay she had tracheostomy site infection for which she completed antibiotic therapy. Functional status has improved to the point that she has tolerated hemodialysis in recliner on 7/5. Lurline Idol site has been decannulated and PEG tube placed 6/22. She is medically stable for discharge to SNF, no longer requiring LTAC level of care.     Events procedures   5/28 > Presents to [facility] ED, intubated. 5/29 > Transferred to [facility] Echocardiogram with EF 60%, no acute changes. 5/30 > HD, more awake, following commands with RUE/RLE 6/1 Extubated 6/2 Reintubated. Trached. 6/4 Placed on ATC yesterday afternoon and has tolerated well since 6/5: EVD raised to 20 cm H20 6/7: Failed EVD clamping trial after developing emesis and headache 6/8: Failed EVD clamping due to emesis 6/9: Increase EVD pressure to 25 6/12 back on vent due to hypoxia from mucus plugging, started back on antibiotics 6/14 VP shunt placed. Antibiotics adjusted based on cultures 6/17 No longer vent-dependent  6/19.  Transferred to hospitalist service 09/16/2021 PEG tube placed by IR Dr. Anselm Pancoast 6/28 decannulated Attempting placement in LTAC, no beds available 7/5: Tolerating hemodialysis in chair.  Seeking placement at SNF.  Planned 7/10 after HD.  Assessment and Plan: Intracranial hemorrhage: Head CT 5/29 with 5 cc acute hematoma in R thalamus with intraventricular extension. Hemorrhage was thought due to uncontrolled hypertension.  Brain bleed contributing to severe left-sided weakness, dysphagia along with toxic encephalopathy - S/p external ventricular drain placement by neurosurgery on 5/29 - Subsequent VP shunt placement 6/14 by neurosurgery. Follow up with neurosurgery recommended. - Avoid anticoagulation and SBP >155mHg. prn labetalol ordered. - s/p PEG, plan for SNF rehabilitation   Acute hypoxic respiratory failure: Requiring vent-dependence and tracheostomy now s/p decannulation 6/28, stabilized s/p antibiotics for treatment of Enterobacter and Staph aureus. - Note IR suspicion of aspiration during PEG tube placement 6/22. Monitor for evidence of recurrent aspiration. S/p 5 more days abx w/unasyn 6/23 - 6/27. Continue scopolamine patch.    Dysphagia:  - Continue nutritional support thru PEG placed 6/22. RD following with calorie count to inform decision regarding bolus/nocturnal feeds.   - Continue dysphagia 3, thin liquids, meds crushed in puree per SLP.    ESRD:  - Continue HD in chair as of 7/5, schedule MWF. Will keep same HD spot at DC to SNF.    Essential hypertension: Aim to maintain SBP <1631mg - Continue coreg and norvasc at lowered doses. At goal.    Anemia of chronic disease. Elevated ferritin, normal/elevated folic acid, B1P53 - ESA as directed by nephrology.  - Monitor intermittently.   Hyponatremia: Managed with HD   T2DM: HbA1c 6.2%. - Continue SSI  Thrombocytopenia: Stable - Recheck in AM.  Obesity: Estimated body mass index is 34.72 kg/m as calculated from the following:   Height as of this encounter: '5\' 7"'$  (1.702 m).  Weight as of this encounter: 100.6 kg.   Subjective: No events overnight, no complaints when I wake her up this morning. Amenable to plan to try to  maximize oral intake to decide on quantity needed in tube feeds.  Objective: Vitals:   10/02/21 2346 10/03/21 0348 10/03/21 0500 10/03/21 0835  BP: 129/64 (!) 147/76  (!) 151/73  Pulse: 71 70  72  Resp: '16 18  18  '$ Temp: 98.4 F (36.9 C) 98.6 F (37 C)  98.6 F (37 C)  TempSrc:  Axillary  Oral  SpO2: 98% 94%  92%  Weight:   100.6 kg   Height:      Gen: 56 y.o. female resting quietly leaning to left side in bed Pulm: Clear and nonlabored CV: RRR II/VI systolic murmur without rub or gallop, no significant pitting edema.  GI: +BS, PEG site c/d/i Ext: warm and dry Skin: No new rashes Neuro: Alert, interactive appropriately, full exam not repeated this morning.  Data Personally reviewed: CBC: Recent Labs  Lab 09/27/21 0208 09/28/21 0047 09/29/21 0218 10/01/21 0922  WBC 4.9 4.9 5.0 4.5  NEUTROABS 2.5 2.6 2.5  --   HGB 9.9* 11.2* 11.7* 10.4*  HCT 30.5* 36.7 37.0 32.9*  MCV 79.4* 81.7 79.7* 78.9*  PLT 131* 143* 145* 585*   Basic Metabolic Panel: Recent Labs  Lab 09/27/21 0208 09/28/21 0047 09/29/21 0218 10/01/21 0922  NA 130* 133* 128* 126*  K 4.2 4.2 4.6 4.3  CL 89* 91* 85* 87*  CO2 '29 29 27 24  '$ GLUCOSE 135* 176* 146* 166*  BUN 88* 42* 67* 71*  CREATININE 7.68* 4.45* 6.13* 6.33*  CALCIUM 9.8 9.9 9.9 9.7  MG 2.4 2.1 2.4  --   PHOS 4.3 3.1 3.4 4.0   GFR: Estimated Creatinine Clearance: 12.5 mL/min (A) (by C-G formula based on SCr of 6.33 mg/dL (H)). Liver Function Tests: Recent Labs  Lab 09/27/21 0208 09/28/21 0047 09/29/21 0218 10/01/21 0922  AST '15 18 19  '$ --   ALT '9 9 9  '$ --   ALKPHOS 91 102 97  --   BILITOT 0.4 0.5 0.6  --   PROT 7.6 8.2* 8.1  --   ALBUMIN 3.0* 3.1* 3.0* 2.9*   CBG: Recent Labs  Lab 10/02/21 1625 10/02/21 1940 10/02/21 2347 10/03/21 0349 10/03/21 0816  GLUCAP 101* 148* 151* 142* 156*   Family Communication: None at bedside  Disposition: Status is: Inpatient Remains inpatient appropriate because: Unsafe DC, requires SNF  level of care and confirmation of outpatient hemodialysis. Renal navigator confirmed their outpatient HD unit will require infection prevention clearance and an in-service on PEG tube management which will not be available until Monday, so first hemodialysis as an outpatient wouldn't be available until Wednesday, 7/12.  Planned Discharge Destination: Skilled nursing facility Monday, 7/10, after hemodialysis  Patrecia Pour, MD 10/03/2021 8:44 AM Page by [email or web address]

## 2021-10-04 DIAGNOSIS — D696 Thrombocytopenia, unspecified: Secondary | ICD-10-CM

## 2021-10-04 LAB — CBC
HCT: 38.6 % (ref 36.0–46.0)
Hemoglobin: 12.7 g/dL (ref 12.0–15.0)
MCH: 25.6 pg — ABNORMAL LOW (ref 26.0–34.0)
MCHC: 32.9 g/dL (ref 30.0–36.0)
MCV: 77.8 fL — ABNORMAL LOW (ref 80.0–100.0)
Platelets: 133 10*3/uL — ABNORMAL LOW (ref 150–400)
RBC: 4.96 MIL/uL (ref 3.87–5.11)
RDW: 17.6 % — ABNORMAL HIGH (ref 11.5–15.5)
WBC: 5.1 10*3/uL (ref 4.0–10.5)
nRBC: 0 % (ref 0.0–0.2)

## 2021-10-04 LAB — GLUCOSE, CAPILLARY
Glucose-Capillary: 171 mg/dL — ABNORMAL HIGH (ref 70–99)
Glucose-Capillary: 184 mg/dL — ABNORMAL HIGH (ref 70–99)
Glucose-Capillary: 121 mg/dL — ABNORMAL HIGH (ref 70–99)

## 2021-10-04 LAB — COMPREHENSIVE METABOLIC PANEL
ALT: 16 U/L (ref 0–44)
AST: 26 U/L (ref 15–41)
Albumin: 3.3 g/dL — ABNORMAL LOW (ref 3.5–5.0)
Alkaline Phosphatase: 116 U/L (ref 38–126)
Anion gap: 21 — ABNORMAL HIGH (ref 5–15)
BUN: 104 mg/dL — ABNORMAL HIGH (ref 6–20)
CO2: 24 mmol/L (ref 22–32)
Calcium: 10.2 mg/dL (ref 8.9–10.3)
Chloride: 80 mmol/L — ABNORMAL LOW (ref 98–111)
Creatinine, Ser: 7.55 mg/dL — ABNORMAL HIGH (ref 0.44–1.00)
GFR, Estimated: 6 mL/min — ABNORMAL LOW (ref >60)
Glucose, Bld: 166 mg/dL — ABNORMAL HIGH (ref 70–99)
Potassium: 5 mmol/L (ref 3.5–5.1)
Sodium: 125 mmol/L — ABNORMAL LOW (ref 135–145)
Total Bilirubin: 0.3 mg/dL (ref 0.3–1.2)
Total Protein: 8.6 g/dL — ABNORMAL HIGH (ref 6.5–8.1)

## 2021-10-04 MED ORDER — NEPRO/CARBSTEADY PO LIQD
237.0000 mL | Freq: Every day | ORAL | Status: DC
  Administered 2021-10-04 – 2021-10-05 (×2): 237 mL
  Filled 2021-10-04 (×4): qty 237

## 2021-10-04 NOTE — Plan of Care (Signed)
  Problem: Education: Goal: Knowledge of disease or condition will improve Outcome: Progressing Goal: Knowledge of patient specific risk factors will improve (INDIVIDUALIZE FOR PATIENT) Outcome: Progressing   Problem: Coping: Goal: Will verbalize positive feelings about self Outcome: Progressing   Problem: Self-Care: Goal: Ability to participate in self-care as condition permits will improve Outcome: Progressing

## 2021-10-04 NOTE — Progress Notes (Addendum)
Contacted [facility] this am and awaiting return call from [place] with update.   Melven Sartorius Renal Navigator [phone]  Addendum at 12:36 pm: [employer] and spoke to [place]. Clinic does not have an update as of yet.   Addendum at 2:18 pm: Received a call from [place] at [facility]. Clinic has received training and can resume pt's care on Wednesday. Pt will need to arrive between 6:10-6:15 am for 6:30 chair time. SNF will need to send a hoyer pad/sling under pt to HD so pt can be transferred from w/c to HD chair for treatment. This info provided to attending, RN, CSW, and renal MD/NP via secure chat. Pt is for possible d/c tomorrow per staff.

## 2021-10-04 NOTE — Progress Notes (Signed)
Nutrition Follow-up  DOCUMENTATION CODES:   Obesity unspecified  INTERVENTION:  Transition tube feeding from nocturnal to bolus feeding via PEG: 230m cartons of Nepro 5x/d Flush with 338mof water before and after each bolus feed This will provide 2097kcal, 96g of protein, and 116173mf freewater (TF+flush)  NUTRITION DIAGNOSIS:   Inadequate oral intake related to inability to eat (pt sedated and ventilated) as evidenced by NPO status.  Ongoing-pt now on dysphagia 3 diet with thin liquids   GOAL:   Patient will meet greater than or equal to 90% of their needs  Ongoing- meeting needs via tube feeding  MONITOR:   PO intake, Labs, Weight trends, TF tolerance, I & O's  REASON FOR ASSESSMENT:   Consult Enteral/tube feeding initiation and management  ASSESSMENT:   [age]o female with h/o ESRD on HD, HTN, DM, COVID 19 (03/2019) and CHF who is admitted with right thalamus ICH with IVH and obstructive hydrocephalus s/p right frontal ventriculostomy with EVD placement 5/29.  05/29 - s/p EVD 06/02 - s/p trach and Cortrak placement  06/14 - s/p VP shunt 06/22 - s/p PEG tube placement 06/23 - TF rate decreased to 35 ml/hr per MD 06/24 - TF rate increased to 45 ml/hr per MD 06/27 - TF rate increased to goal of 50 ml/hr per MD, s/p MBS with diet advanced to dysphagia 1 with thin liquids 07/04 - SLP evaluation, diet advanced to DYS 3 diet  07/07- pt transitioned to nocturnal TF   Calorie count started on Friday. Unable to fully assess given limited meal ticket documentation over the weekend. Meal completions documented in chart to be 0-15% x 2 meals on Saturday (7/8); 0% x2 meals, 10% x1 meal and 20% x1 meal on Sunday (7/9). Spoke with pt and her husband at bedside who reports that she continues to eat very minimally d/t poor appetite. Discussed with them transitioning tube feeding from nocturnal to bolus given ongoing inadequate PO intake. They are agreeable to this transition.    Admit wt: 104.5 kg Current wt: 99.6 kg  Edema: mild pitting generalized  Medications: calcitriol, pridex, nutrisource fiber, SSI 0-9 units q4h, Rena-vit, protonix, renvela  Labs: sodium 125, BUN 104, Cr 7.55, anion gap 21, GFR 6, CBG's 142-184 x24 hours  Diet Order:   Diet Order             DIET DYS 3 Room service appropriate? Yes; Fluid consistency: Thin  Diet effective now                   EDUCATION NEEDS:   No education needs have been identified at this time  Skin:  Skin Assessment: Skin Integrity Issues: Skin Integrity Issues:: Incisions, Other (Comment) Incisions: head, abdomen Other: eschar L great toe amputation site  Last BM:  09/20/21 type 6  Height:   Ht Readings from Last 1 Encounters:  09/09/21 '5\' 7"'$  (1.702 m)    Weight:   Wt Readings from Last 1 Encounters:  10/04/21 99.6 kg    Ideal Body Weight:  61.3 kg  BMI:  Body mass index is 34.39 kg/m.  Estimated Nutritional Needs:   Kcal:  1900-2200kcal/day  Protein:  100-120  Fluid:  1L+UOP  AllClayborne DanaDN, LDN Clinical Nutrition

## 2021-10-04 NOTE — Assessment & Plan Note (Addendum)
Volume and lytes per renal  Trending down over past few days, will discuss with renal

## 2021-10-04 NOTE — Progress Notes (Signed)
PROGRESS NOTE    Janet Mitchell  KYH:[id number] DOB: 18-Mar-1966 DOA: 08/23/2021 PCP: Lindell Spar, MD  Chief Complaint  Patient presents with   Code Stroke    Brief Narrative:  Brief Narrative (HPI from H&P)  56 y.o. female with history of ESRD on HD MWF, CHF, poorly controlled HTN, DM presenting with nausea, vomiting, left side weakness present upon waking.  Scented to [facility] on 08/23/2021 with nausea vomiting left-sided weakness, work-up showed right-sided thalamic hemorrhage she was intubated and transferred to [facility] under ICU care for further treatment.  She was seen by [facility], nephrology for HD, neurology and neurosurgery, external ventricular drain placement by neurosurgery on 08/23/2021.  She also was intubated, trached, during her hospital stay she had tracheostomy site infection for which she is on antibiotic.  She has been transferred to hospitalist service on 09/13/2021 on day 20 one of her hospital stay with plan of going to [facility] versus SNF.     Events procedures   5/28 > Presents to [facility] ED, intubated. 5/29 > Transferred to [facility] Echocardiogram with EF 60%, no acute changes. 5/30 > HD, more awake, following commands with RUE/RLE 6/1 No issues overnight, she remains alert and interactive on vent.  Extubated 6/2 failed extubation requiring reintubation. Trached. 6/4 Placed on ATC yesterday afternoon and has tolerated well since 6/5: EVD raised to 20 cm H20 6/7: Failed EVD clamping trial after developing emesis and headache 6/8: Failed EVD clamping due to emesis 6/9: Increase EVD pressure to 25 6/12 back on vent due to hypoxia from mucus plugging, started back on antibiotics 6/14 VP shunt placed. Antibiotics adjusted based on cultures 6/17-has been off the vent since 09/10/2021, did not require to be on overnight. 6/19.  Transferred to hospitalist service 09/16/2021 PEG tube placed by IR Dr. Anselm Pancoast 6/28 decannulated 7/5 tolerating dialysis in  chair Plan for SNF 7/11     Assessment & Plan:   Principal Problem:   Intracranial hemorrhage (HCC) Active Problems:   Respiratory failure requiring intubation ([place])   History of tracheostomy   ESRD (end stage renal disease) on dialysis [facility])   Dysphagia   Essential hypertension   Hyponatremia   Anemia   Type 2 diabetes mellitus ([place])   Obesity, morbid, BMI 50 or higher ([place])   Thrombocytopenia (HCC)   Assessment and Plan: * Intracranial hemorrhage ([place]) Head CT 5/29 with 5 cc acute hematoma in R thalamus with intraventricular extension S/p external ventricular drain placement by neurosurgery on 5/29 Subsequent VP shunt placement 6/14 Hemorrhage was thought due to uncontrolled hypertension.  Brain bleed contributing to severe left-sided weakness, dysphagia along with toxic encephalopathy Was seen by neurosurgery and neurology Avoid SBP >160 S/p peg placement on 6/22 Awaiting SNF  History of tracheostomy decannulated 6/28  Respiratory failure requiring intubation ([place]) Now with trach collar with tracheostomy site infection.  She has finished her course of antibiotics for treatment of Enterobacter and Staph aureus, with antibiotic stop date 09/15/2021 per PCCM team.  Tracheostomy deferred to PCCM.  There is Beatrix Breece question of ongoing aspiration of tube feeds also while getting PEG tube placed she had emesis and IR physician was suspicious of aspiration during the procedure on 09/16/2021.    She had increase in her tracheostomy site secretions, chest x-ray and CT scan showed bilateral infiltrates, although she has no fever or leukocytosis but due to witnessed aspiration during PEG tube placement and radiological findings will give her 5 days of Unasyn carting on 09/17/2021  and monitor.  Clinically gradual improvement.  Secretions also improved after addition of scopolamine patch on 09/20/2021.    S/p MBS by speech, recommending dysphagia 1, thin liquids  decannulated 6/28  Continue  to monitor for any signs of ongoing aspiration in the future.   Dysphagia SLP recommending dysphagia 1, thin liquids, meds crushed with puree S/p PEG placement RD recommending bolus tube feeds via peg (calorie count limited, limited meal ticket documentation - pt and husband report poor appetite and poor PO intake)  ESRD (end stage renal disease) on dialysis [facility]) nephrology following If able to sit in chair for several hours, could potentially go to SNF (per renal)  Hyponatremia Volume and lytes per renal  Trending down over past few days, will discuss with renal   Essential hypertension Coreg lower dose Amlodipine resumed lower dose Follow closely Maintain BP < 160  Anemia Elevated ferritin Suspect AOCD Will check b12/folate -> b12 elevated, folate wnl  Type 2 diabetes mellitus (HCC) a1c 6.2 follow  Obesity, morbid, BMI 50 or higher (HCC) noted  Thrombocytopenia ([place]) Noted, stable          DVT prophylaxis: heparin Code Status: full Family Communication: husband at bedside Disposition:   Status is: Inpatient Remains inpatient appropriate because: pending placement   Consultants:  Neurology IR Renal NSGY PCCM  Procedures:  Echo IMPRESSIONS     1. Left ventricular ejection fraction, by estimation, is 60 to 65%. The  left ventricle has normal function. The left ventricle has no regional  wall motion abnormalities. There is severe left ventricular hypertrophy.  Left ventricular diastolic parameters   are consistent with Grade II diastolic dysfunction (pseudonormalization).  Elevated left atrial pressure.   2. Right ventricular systolic function is normal. The right ventricular  size is normal.   3. Left atrial size was moderately dilated.   4. The mitral valve is normal in structure. Trivial mitral valve  regurgitation. No evidence of mitral stenosis.   5. The aortic valve is tricuspid. Aortic valve regurgitation is not  visualized. Aortic  valve sclerosis is present, with no evidence of aortic  valve stenosis.   6. The inferior vena cava is normal in size with greater than 50%  respiratory variability, suggesting right atrial pressure of 3 mmHg.   6/23 IMPRESSION: Successful fluoroscopic guided percutaneous gastrostomy tube placement.   5/29 external ventricular drain 6/2 trach 6/14 VP shunt placed    Antimicrobials:  Anti-infectives (From admission, onward)    Start     Dose/Rate Route Frequency Ordered Stop   09/17/21 1545  ampicillin-sulbactam (UNASYN) 1.5 g in sodium chloride 0.9 % 100 mL IVPB        1.5 g 200 mL/hr over 30 Minutes Intravenous Every 12 hours 09/17/21 1445 09/22/21 1544   09/16/21 1543  ceFAZolin (ANCEF) IVPB 2g/100 mL premix        over 30 Minutes Intravenous Continuous PRN 09/16/21 1552 09/16/21 1543   09/16/21 0500  ceFAZolin (ANCEF) IVPB 2g/100 mL premix        2 g 200 mL/hr over 30 Minutes Intravenous To Radiology 09/15/21 1358 09/17/21 0500   09/09/21 1800  ceFEPIme (MAXIPIME) 1 g in sodium chloride 0.9 % 100 mL IVPB        1 g 200 mL/hr over 30 Minutes Intravenous Every 24 hours 09/08/21 1116 09/15/21 1759   09/08/21 1330  ceFEPIme (MAXIPIME) 1 g in sodium chloride 0.9 % 100 mL IVPB        1 g 200 mL/hr  over 30 Minutes Intravenous NOW 09/08/21 1234 09/08/21 1352   09/08/21 1215  cefTRIAXone (ROCEPHIN) 1 g in sodium chloride 0.9 % 100 mL IVPB  Status:  Discontinued        1 g 200 mL/hr over 30 Minutes Intravenous  Once 09/08/21 1116 09/08/21 1234   09/08/21 1200  vancomycin (VANCOCIN) IVPB 1000 mg/200 mL premix  Status:  Discontinued        1,000 mg 200 mL/hr over 60 Minutes Intravenous Every M-W-F (Hemodialysis) 09/06/21 1518 09/07/21 0741   09/08/21 0730  ceFAZolin (ANCEF) IVPB 2g/100 mL premix        2 g 200 mL/hr over 30 Minutes Intravenous On call to O.R. 09/08/21 [id number] 09/08/21 0848   09/06/21 2200  vancomycin (VANCOCIN) IVPB 1000 mg/200 mL premix  Status:  Discontinued         1,000 mg 200 mL/hr over 60 Minutes Intravenous  Once 09/06/21 1518 09/06/21 2156   09/06/21 2000  piperacillin-tazobactam (ZOSYN) IVPB 2.25 g  Status:  Discontinued        2.25 g 100 mL/hr over 30 Minutes Intravenous Every 8 hours 09/06/21 1518 09/08/21 1116   09/06/21 1115  piperacillin-tazobactam (ZOSYN) IVPB 3.375 g        3.375 g 100 mL/hr over 30 Minutes Intravenous  Once 09/06/21 1023 09/06/21 1132   09/06/21 1115  vancomycin (VANCOREADY) IVPB 2000 mg/400 mL        2,000 mg 200 mL/hr over 120 Minutes Intravenous  Once 09/06/21 1024 09/06/21 1342   08/23/21 1230  piperacillin-tazobactam (ZOSYN) IVPB 2.25 g        2.25 g 100 mL/hr over 30 Minutes Intravenous Every 8 hours 08/23/21 1141 08/28/21 0523       Subjective: No new complaints Husband at bedside  Objective: Vitals:   10/04/21 1700 10/04/21 1714 10/04/21 1730 10/04/21 1823  BP:  (!) 165/101 (!) 141/82 (!) 167/77  Pulse: 75 76 77 79  Resp: (!) '23 13 15 17  '$ Temp:      TempSrc:      SpO2: 98% 99% 99% 98%  Weight:      Height:       No intake or output data in the 24 hours ending 10/04/21 1841 Filed Weights   10/03/21 0500 10/04/21 0443 10/04/21 1500  Weight: 100.6 kg 99.6 kg (!) 217 kg    Examination:  General exam: Appears calm and comfortable  Respiratory system: unlabored Cardiovascular system: RRR Gastrointestinal system: Abdomen is nondistended, soft and nontender. Peg Central nervous system: L sided weakness Extremities: no LEE   Data Reviewed: I have personally reviewed following labs and imaging studies  CBC: Recent Labs  Lab 09/28/21 0047 09/29/21 0218 10/01/21 0922 10/04/21 0754  WBC 4.9 5.0 4.5 5.1  NEUTROABS 2.6 2.5  --   --   HGB 11.2* 11.7* 10.4* 12.7  HCT 36.7 37.0 32.9* 38.6  MCV 81.7 79.7* 78.9* 77.8*  PLT 143* 145* 144* 133*    Basic Metabolic Panel: Recent Labs  Lab 09/28/21 0047 09/29/21 0218 10/01/21 0922 10/04/21 0754  NA 133* 128* 126* 125*  K 4.2 4.6 4.3 5.0   CL 91* 85* 87* 80*  CO2 '29 27 24 24  '$ GLUCOSE 176* 146* 166* 166*  BUN 42* 67* 71* 104*  CREATININE 4.45* 6.13* 6.33* 7.55*  CALCIUM 9.9 9.9 9.7 10.2  MG 2.1 2.4  --   --   PHOS 3.1 3.4 4.0  --     GFR: Estimated Creatinine Clearance: 16.8 mL/min (  Xitlalic Maslin) (by C-G formula based on SCr of 7.55 mg/dL (H)).  Liver Function Tests: Recent Labs  Lab 09/28/21 0047 09/29/21 0218 10/01/21 0922 10/04/21 0754  AST 18 19  --  26  ALT 9 9  --  16  ALKPHOS 102 97  --  116  BILITOT 0.5 0.6  --  0.3  PROT 8.2* 8.1  --  8.6*  ALBUMIN 3.1* 3.0* 2.9* 3.3*    CBG: Recent Labs  Lab 10/03/21 1652 10/03/21 2006 10/03/21 2341 10/04/21 0440 10/04/21 1147  GLUCAP 146* 181* 142* 171* 184*     No results found for this or any previous visit (from the past 240 hour(s)).       Radiology Studies: No results found.      Scheduled Meds:  amantadine  200 mg Per Tube Weekly   amLODipine  2.5 mg Per Tube Daily   calcitRIOL  1 mcg Per Tube Q M,W,F   carvedilol  3.125 mg Per Tube BID WC   chlorhexidine  15 mL Mouth Rinse BID   Chlorhexidine Gluconate Cloth  6 each Topical Q0600   feeding supplement (NEPRO CARB STEADY)  237 mL Per Tube 5 X Daily   fiber  1 packet Per Tube BID   heparin injection (subcutaneous)  5,000 Units Subcutaneous Q8H   insulin aspart  0-9 Units Subcutaneous Q4H   multivitamin  1 tablet Per Tube QHS   mouth rinse  15 mL Mouth Rinse 4 times per day   pantoprazole sodium  40 mg Per Tube Daily   scopolamine  1 patch Transdermal Q72H   sevelamer carbonate  0.8 g Per Tube TID WC   Continuous Infusions:  sodium chloride 0 mL/hr at 09/06/21 1141     LOS: 42 days    Time spent: over 30 min    Fayrene Helper, MD [facility]   To contact the attending provider between 7A-7P or the covering provider during after hours 7P-7A, please log into the web site [email or web address] and access using universal [place] password for that web site. If you do not have the  password, please call the hospital operator.  10/04/2021, 6:41 PM

## 2021-10-04 NOTE — Plan of Care (Signed)
  Problem: Education: Goal: Knowledge of disease or condition will improve Outcome: Progressing Goal: Knowledge of secondary prevention will improve ([place] ALL) Outcome: Progressing Goal: Knowledge of patient specific risk factors will improve (INDIVIDUALIZE FOR PATIENT) Outcome: Progressing   Problem: Coping: Goal: Will verbalize positive feelings about self Outcome: Progressing Goal: Will identify appropriate support needs Outcome: Progressing   Problem: Health Behavior/Discharge Planning: Goal: Ability to manage health-related needs will improve Outcome: Progressing

## 2021-10-04 NOTE — TOC Progression Note (Signed)
Transition of Care [facility]) - Progression Note    Patient Details  Name: Kita Neace MRN: [id number] Date of Birth: [birth year]  Transition of Care [facility]) CM/SW [place], [place] Phone Number: 10/04/2021, 4:21 PM  Clinical Narrative:   CSW notified by Renal Navigator that patient's outpatient dialysis center is able to admit her for treatment Wednesday. Patient pending HD today, can admit to SNF tomorrow. Authorization still good tomorrow. CSW to follow.    Expected Discharge Plan: [place] Barriers to Discharge: Continued Medical Work up  Expected Discharge Plan and Services Expected Discharge Plan: [place] In-house Referral: Clinical Social Work Discharge Planning Services: CM Consult   Living arrangements for the past 2 months: Single Family Home Expected Discharge Date: 10/01/21                                     Social Determinants of Health (SDOH) Interventions    Readmission Risk Interventions    07/19/2019    1:50 PM 07/12/2019    4:57 PM 04/05/2019    2:18 PM  Readmission Risk Prevention Plan  Transportation Screening Complete Complete Complete  Medication Review (RN Care Manager) Complete  Complete  PCP or Specialist appointment within 3-5 days of discharge Patient refused    [place] or [place] Complete    Palliative Care Screening Not Applicable  Not [place] Not Applicable Not Applicable Patient Refused

## 2021-10-04 NOTE — Assessment & Plan Note (Signed)
Noted, stable. ?

## 2021-10-04 NOTE — Progress Notes (Signed)
Patient ID: Janet Mitchell, female   DOB: [birth year], 56 y.o.   MRN: [id number] S: No new complaints. O:BP (!) 149/72 (BP Location: Left Arm)   Pulse 82   Temp 98.9 F (37.2 C) (Oral)   Resp 16   Ht '5\' 7"'$  (1.702 m)   Wt 99.6 kg   SpO2 90%   BMI 34.39 kg/m   Intake/Output Summary (Last 24 hours) at 10/04/2021 1207 Last data filed at 10/03/2021 1700 Gross per 24 hour  Intake 10 ml  Output --  Net 10 ml    Intake/Output: I/O last 3 completed shifts: In: 900 [P.O.:120; NG/GT:780] Out: 0   Intake/Output this shift:  No intake/output data recorded. Weight change: -0.962 kg Gen: NAD CVS: RRR Resp:CTA Abd: +BS, soft, NT/Nd Ext: no edema, LUE AVF +T/B  Recent Labs  Lab 09/28/21 0047 09/29/21 0218 10/01/21 0922 10/04/21 0754  NA 133* 128* 126* 125*  K 4.2 4.6 4.3 5.0  CL 91* 85* 87* 80*  CO2 '29 27 24 24  '$ GLUCOSE 176* 146* 166* 166*  BUN 42* 67* 71* 104*  CREATININE 4.45* 6.13* 6.33* 7.55*  ALBUMIN 3.1* 3.0* 2.9* 3.3*  CALCIUM 9.9 9.9 9.7 10.2  PHOS 3.1 3.4 4.0  --   AST 18 19  --  26  ALT 9 9  --  16    Liver Function Tests: Recent Labs  Lab 09/28/21 0047 09/29/21 0218 10/01/21 0922 10/04/21 0754  AST 18 19  --  26  ALT 9 9  --  16  ALKPHOS 102 97  --  116  BILITOT 0.5 0.6  --  0.3  PROT 8.2* 8.1  --  8.6*  ALBUMIN 3.1* 3.0* 2.9* 3.3*    No results for input(s): "LIPASE", "AMYLASE" in the last 168 hours. No results for input(s): "AMMONIA" in the last 168 hours. CBC: Recent Labs  Lab 09/28/21 0047 09/29/21 0218 10/01/21 0922 10/04/21 0754  WBC 4.9 5.0 4.5 5.1  NEUTROABS 2.6 2.5  --   --   HGB 11.2* 11.7* 10.4* 12.7  HCT 36.7 37.0 32.9* 38.6  MCV 81.7 79.7* 78.9* 77.8*  PLT 143* 145* 144* 133*    Cardiac Enzymes: No results for input(s): "CKTOTAL", "CKMB", "CKMBINDEX", "TROPONINI" in the last 168 hours. CBG: Recent Labs  Lab 10/03/21 1652 10/03/21 2006 10/03/21 2341 10/04/21 0440 10/04/21 1147  GLUCAP 146* 181* 142* 171* 184*      Iron Studies: No results for input(s): "IRON", "TIBC", "TRANSFERRIN", "FERRITIN" in the last 72 hours. Studies/Results: No results found.  amantadine  200 mg Per Tube Weekly   amLODipine  2.5 mg Per Tube Daily   calcitRIOL  1 mcg Per Tube Q M,W,F   carvedilol  3.125 mg Per Tube BID WC   chlorhexidine  15 mL Mouth Rinse BID   Chlorhexidine Gluconate Cloth  6 each Topical Q0600   feeding supplement (NEPRO CARB STEADY)  1,000 mL Per Tube Q24H   feeding supplement (PROSource TF)  45 mL Per Tube BID   fiber  1 packet Per Tube BID   heparin injection (subcutaneous)  5,000 Units Subcutaneous Q8H   insulin aspart  0-9 Units Subcutaneous Q4H   multivitamin  1 tablet Per Tube QHS   mouth rinse  15 mL Mouth Rinse 4 times per day   pantoprazole sodium  40 mg Per Tube Daily   scopolamine  1 patch Transdermal Q72H   sevelamer carbonate  0.8 g Per Tube TID WC     Dialysis  Orders: [facility] MWF  4h  97.5kg  450/500  2/2.5 bath  15ga  AVF  Hep none - mircera 100 ug q2 - rocaltrol 1.0 mcg po tiw   Assessment/Plan: Right thalamic intracranial hemorrhage - w/ intraventricular hemorrhage and hydrocephalus. Underwent ventriculostomy drain placement, then VP shunt surgery 6/14 per neurosurgery.  Resp failure - trach removed 6/28.  ESRD - gets HD MWF and will continue with schedule.  Pt was able to sit in recliner for entire HD session on 09/29/21 and 10/01/21.  HTN/volume  - is under dry wt, getting tube feeds, euvolemic, getting low dose norvasc and coreg. BP's good.  Anemia CKD - got aranesp on 6/17 at 100 mcg off schedule.  Is ordered for 100 mcg weekly on Fridays.  Hb 9.6. Tsat 16 but ferritin 1400, will hold off on IV Fe for now.  CKD-MBD - Calcium elevated, phos at goal. On calcitriol. We transitioned to renvela due to hypercalcemia while here.  Improved. Only 2.5 calcium dialysate available here.  On nepro.  Atrial fib - per primary team Disposition - trach was dc'd, she could be dc'd  to SNF since she was able to have HD on 09/29/21 and 10/01/21 in a recliner.  Current issue is her OP HD unit needs PEG tube training so earliest she can get OP HD is Wed 7/12. OK for dc from renal standpoint.    Kelly Splinter  MD 10/04/2021, 12:09 PM

## 2021-10-04 NOTE — Progress Notes (Signed)
Speech Language Pathology Treatment: Cognitive-Linquistic;Dysphagia  Patient Details Name: Janet Mitchell MRN: [id number] DOB: 1965/10/04 Today's Date: 10/04/2021 Time: 5631-4970 SLP Time Calculation (min) (ACUTE ONLY): 28 min  Assessment / Plan / Recommendation Clinical Impression  Pt alert and consented to PO trials and training in swallow strategies. Pt and husband, present at bedside, report no difficulties with dys 3 diet. RN reports some coughing with POs, although husband states she has a cough regardless of POs. Sitting upright in bed, pt self-fed sips of thin by straw without signs of aspiration. Bites of true dys 3 solids x6 was significant for mildly prolonged mastication and mild residuals on L side of oral cavity. With mod multimodal cues for lingual sweep and liquid wash, POs easily cleared in 4/6 trials. Attempted x2 bites of dys 3 meat, which posed greater difficulty for pt and she required repeated liquid washes and lingual sweeps to clear adequately. Bite of puree and placement of bolus on R side intermittently effective in decreasing oral residuals. Very few dys 3 meat bolus particles removed with oral swab at end of session. Inconsistent throat clearing x2 and coughing x2 noted during intake, suspect due to delay in swallow initiation in conjunction with poor postural control requiring frequent repositioning. Pt recalled use of lingual sweep and liquid wash with 100% accuracy immediately at start of session and with 50% accuracy after a delay. Continue dys 3/thin liquid diet with full supervision for meals for swallow strategies and to ensure oral clearance of POs. SLP to f/u for treatment of swallow and cognitive functions.    HPI HPI: Patient is a 56 y.o. female who presented to [facility] ER as a code stroke with c/o nausea and vomiting upon waking up in the morning along with left sided weakness. She was communicative at [facility] but during stay awaiting transfer to [facility] she required emergent  intubation due to inability to protect airway. 6/1 CXR showed Diffuse bilateral lung opacities may be due to pulmonary edema or multifocal pneumonia. MRI brain showed right thalamic intraparenchymal hemorrhage  with surrounding edema. She was extubated on 6/1 and although initially tolerated well, quickly was seen with poor, ineffective cough, copious secretions unable to clear independently but in no obvious distress and oxygen saturations 100% on 4L nasal cannula. She was reintubated on 6/2 to tracheostomy #6 cuffed Shiley.  Decannulated 6/28. Pt with PMH: ESRD on HD MWF, CHF, HTN, DM.      SLP Plan  Continue with current plan of care      Recommendations for follow up therapy are one component of a multi-disciplinary discharge planning process, led by the attending physician.  Recommendations may be updated based on patient status, additional functional criteria and insurance authorization.    Recommendations  Diet recommendations: Dysphagia 3 (mechanical soft);Thin liquid Liquids provided via: Straw Medication Administration: Crushed with puree Supervision: Full supervision/cueing for compensatory strategies;Staff to assist with self feeding Compensations: Minimize environmental distractions;Slow rate;Small sips/bites;Lingual sweep for clearance of pocketing;Follow solids with liquid;Other (Comment) (Check oral cavity for residue on L side. puree or finger sweep as needed to clear) Postural Changes and/or Swallow Maneuvers: Seated upright 90 degrees                Oral Care Recommendations: Oral care BID Follow Up Recommendations: Skilled nursing-short term rehab (<3 hours/day) Assistance recommended at discharge: Frequent or constant Supervision/Assistance SLP Visit Diagnosis: Dysphagia, oropharyngeal phase (R13.12);Cognitive communication deficit (R41.841) Plan: Continue with current plan of care  [place], [place], [place] Acute [facility] Services Office  Number: [phone]   Acie Fredrickson  10/04/2021, 10:23 AM

## 2021-10-05 LAB — CBC
HCT: 31.6 % — ABNORMAL LOW (ref 36.0–46.0)
Hemoglobin: 10.1 g/dL — ABNORMAL LOW (ref 12.0–15.0)
MCH: 24.9 pg — ABNORMAL LOW (ref 26.0–34.0)
MCHC: 32 g/dL (ref 30.0–36.0)
MCV: 77.8 fL — ABNORMAL LOW (ref 80.0–100.0)
Platelets: 132 10*3/uL — ABNORMAL LOW (ref 150–400)
RBC: 4.06 MIL/uL (ref 3.87–5.11)
RDW: 17.4 % — ABNORMAL HIGH (ref 11.5–15.5)
WBC: 4.2 10*3/uL (ref 4.0–10.5)
nRBC: 0 % (ref 0.0–0.2)

## 2021-10-05 LAB — COMPREHENSIVE METABOLIC PANEL
ALT: 15 U/L (ref 0–44)
AST: 27 U/L (ref 15–41)
Albumin: 3 g/dL — ABNORMAL LOW (ref 3.5–5.0)
Alkaline Phosphatase: 100 U/L (ref 38–126)
Anion gap: 17 — ABNORMAL HIGH (ref 5–15)
BUN: 49 mg/dL — ABNORMAL HIGH (ref 6–20)
CO2: 26 mmol/L (ref 22–32)
Calcium: 9.5 mg/dL (ref 8.9–10.3)
Chloride: 87 mmol/L — ABNORMAL LOW (ref 98–111)
Creatinine, Ser: 4.62 mg/dL — ABNORMAL HIGH (ref 0.44–1.00)
GFR, Estimated: 11 mL/min — ABNORMAL LOW (ref >60)
Glucose, Bld: 146 mg/dL — ABNORMAL HIGH (ref 70–99)
Potassium: 4.1 mmol/L (ref 3.5–5.1)
Sodium: 130 mmol/L — ABNORMAL LOW (ref 135–145)
Total Bilirubin: 0.4 mg/dL (ref 0.3–1.2)
Total Protein: 7.6 g/dL (ref 6.5–8.1)

## 2021-10-05 LAB — CBC WITH DIFFERENTIAL/PLATELET
Abs Immature Granulocytes: 0.01 10*3/uL (ref 0.00–0.07)
Basophils Absolute: 0 10*3/uL (ref 0.0–0.1)
Basophils Relative: 1 %
Eosinophils Absolute: 0.2 10*3/uL (ref 0.0–0.5)
Eosinophils Relative: 5 %
HCT: 32.7 % — ABNORMAL LOW (ref 36.0–46.0)
Hemoglobin: 10.4 g/dL — ABNORMAL LOW (ref 12.0–15.0)
Immature Granulocytes: 0 %
Lymphocytes Relative: 27 %
Lymphs Abs: 1.2 10*3/uL (ref 0.7–4.0)
MCH: 25.2 pg — ABNORMAL LOW (ref 26.0–34.0)
MCHC: 31.8 g/dL (ref 30.0–36.0)
MCV: 79.2 fL — ABNORMAL LOW (ref 80.0–100.0)
Monocytes Absolute: 0.7 10*3/uL (ref 0.1–1.0)
Monocytes Relative: 15 %
Neutro Abs: 2.3 10*3/uL (ref 1.7–7.7)
Neutrophils Relative %: 52 %
Platelets: 121 10*3/uL — ABNORMAL LOW (ref 150–400)
RBC: 4.13 MIL/uL (ref 3.87–5.11)
RDW: 17.3 % — ABNORMAL HIGH (ref 11.5–15.5)
WBC: 4.5 10*3/uL (ref 4.0–10.5)
nRBC: 0 % (ref 0.0–0.2)

## 2021-10-05 LAB — GLUCOSE, CAPILLARY
Glucose-Capillary: 142 mg/dL — ABNORMAL HIGH (ref 70–99)
Glucose-Capillary: 106 mg/dL — ABNORMAL HIGH (ref 70–99)
Glucose-Capillary: 160 mg/dL — ABNORMAL HIGH (ref 70–99)

## 2021-10-05 LAB — RENAL FUNCTION PANEL
Albumin: 2.9 g/dL — ABNORMAL LOW (ref 3.5–5.0)
Anion gap: 14 (ref 5–15)
BUN: 52 mg/dL — ABNORMAL HIGH (ref 6–20)
CO2: 28 mmol/L (ref 22–32)
Calcium: 9.3 mg/dL (ref 8.9–10.3)
Chloride: 87 mmol/L — ABNORMAL LOW (ref 98–111)
Creatinine, Ser: 5 mg/dL — ABNORMAL HIGH (ref 0.44–1.00)
GFR, Estimated: 10 mL/min — ABNORMAL LOW (ref >60)
Glucose, Bld: 101 mg/dL — ABNORMAL HIGH (ref 70–99)
Phosphorus: 4.2 mg/dL (ref 2.5–4.6)
Potassium: 3.9 mmol/L (ref 3.5–5.1)
Sodium: 129 mmol/L — ABNORMAL LOW (ref 135–145)

## 2021-10-05 LAB — PHOSPHORUS: Phosphorus: 3.6 mg/dL (ref 2.5–4.6)

## 2021-10-05 LAB — MAGNESIUM: Magnesium: 2.3 mg/dL (ref 1.7–2.4)

## 2021-10-05 MED ORDER — SEVELAMER CARBONATE 0.8 G PO PACK: 0.8000 g | PACK | Freq: Three times a day (TID) | ORAL | 0 refills | Status: AC

## 2021-10-05 MED ORDER — AMLODIPINE BESYLATE 2.5 MG PO TABS
2.5000 mg | ORAL_TABLET | Freq: Once | ORAL | Status: AC
  Administered 2021-10-05: 2.5 mg via ORAL
  Filled 2021-10-05: qty 1

## 2021-10-05 MED ORDER — CARVEDILOL 6.25 MG PO TABS: 6.2500 mg | ORAL_TABLET | Freq: Two times a day (BID) | ORAL | Status: DC

## 2021-10-05 MED ORDER — AMLODIPINE BESYLATE 2.5 MG PO TABS: 5.0000 mg | ORAL_TABLET | Freq: Every day | ORAL | 0 refills | Status: DC

## 2021-10-05 MED ORDER — CARVEDILOL 6.25 MG PO TABS: 6.2500 mg | ORAL_TABLET | Freq: Two times a day (BID) | ORAL | 0 refills | Status: DC

## 2021-10-05 MED ORDER — AMANTADINE HCL 50 MG/5ML PO SOLN: 200.0000 mg | ORAL | 0 refills | Status: DC

## 2021-10-05 MED ORDER — SCOPOLAMINE 1 MG/3DAYS TD PT72: 1.0000 | MEDICATED_PATCH | TRANSDERMAL | 0 refills | Status: DC

## 2021-10-05 MED ORDER — NEPRO/CARBSTEADY PO LIQD: 237.0000 mL | Freq: Every day | ORAL | 0 refills | Status: AC

## 2021-10-05 MED ORDER — PANTOPRAZOLE SODIUM 40 MG PO PACK: 40.0000 mg | PACK | Freq: Every day | ORAL | 0 refills | Status: DC

## 2021-10-05 MED ORDER — POLYVINYL ALCOHOL 1.4 % OP SOLN: 1.0000 [drp] | OPHTHALMIC | 0 refills | Status: AC | PRN

## 2021-10-05 MED ORDER — CALCITRIOL 0.5 MCG PO CAPS: 1.0000 ug | ORAL_CAPSULE | ORAL | Status: DC

## 2021-10-05 MED ORDER — NUTRISOURCE FIBER PO PACK: 1.0000 | PACK | Freq: Two times a day (BID) | ORAL | 0 refills | Status: AC

## 2021-10-05 MED ORDER — AMLODIPINE BESYLATE 5 MG PO TABS
5.0000 mg | ORAL_TABLET | Freq: Every day | ORAL | Status: DC
Start: 2021-10-06 — End: 2021-10-05

## 2021-10-05 NOTE — TOC Progression Note (Signed)
Transition of Care [facility]) - Progression Note    Patient Details  Name: Paityn Balsam MRN: [id number] Date of Birth: 1966/03/07  Transition of Care [facility]) CM/SW [place], LCSW Phone Number: 10/05/2021, 12:21 PM  Clinical Narrative:    CSW updated patient's spouse of discharge today (and renal navigator). Will arrange PTAR for transport.     Expected Discharge Plan: [place] Barriers to Discharge: Continued Medical Work up  Expected Discharge Plan and Services Expected Discharge Plan: [place] In-house Referral: Clinical Social Work Discharge Planning Services: CM Consult   Living arrangements for the past 2 months: Single Family Home Expected Discharge Date: 10/05/21                                     Social Determinants of Health (SDOH) Interventions    Readmission Risk Interventions    07/19/2019    1:50 PM 07/12/2019    4:57 PM 04/05/2019    2:18 PM  Readmission Risk Prevention Plan  Transportation Screening Complete Complete Complete  Medication Review (RN Care Manager) Complete  Complete  PCP or Specialist appointment within 3-5 days of discharge Patient refused    [place] or [place] Complete    Palliative Care Screening Not Applicable  Not [place] Not Applicable Not Applicable Patient Refused

## 2021-10-05 NOTE — Progress Notes (Signed)
Pt to d/c to snf today. Contacted [facility] and spoke to . Clinic aware pt to d/c today and will resume care tomorrow. D/C summary and last renal note faxed to clinic for continuation of care.   Melven Sartorius Renal Navigator [phone]

## 2021-10-05 NOTE — Plan of Care (Signed)
  Problem: Education: Goal: Knowledge of disease or condition will improve Outcome: Progressing Goal: Knowledge of secondary prevention will improve (SELECT ALL) Outcome: Progressing Goal: Knowledge of patient specific risk factors will improve (INDIVIDUALIZE FOR PATIENT) Outcome: Progressing   Problem: Coping: Goal: Will verbalize positive feelings about self Outcome: Progressing Goal: Will identify appropriate support needs Outcome: Progressing

## 2021-10-05 NOTE — TOC Transition Note (Signed)
Transition of Care [facility]) - CM/SW Discharge Note   Patient Details  Name: Janet Mitchell MRN: [id number] Date of Birth: 11-Aug-1965  Transition of Care [facility]) CM/SW Contact:  Benard Halsted, LCSW Phone Number: 10/05/2021, 12:25 PM   Clinical Narrative:    Patient will DC to: [facility] Anticipated DC date: 10/05/21 Family notified: Spouse Transport by: Corey Harold   Per MD patient ready for DC to [facility]. RN to call report prior to discharge [phone] room A15-1). RN, patient, patient's family, and facility notified of DC. Discharge Summary and FL2 sent to facility. DC packet on chart. Ambulance transport requested for patient.   CSW will sign off for now as social work intervention is no longer needed. Please consult [place] again if new needs arise.     Final next level of care: Skilled Nursing Facility Barriers to Discharge: Barriers Resolved   Patient Goals and CMS Choice Patient states their goals for this hospitalization and ongoing recovery are:: Rehab CMS Medicare.gov Compare Post Acute Care list provided to:: Patient Represenative (must comment) Choice offered to / list presented to : Spouse  Discharge Placement   Existing PASRR number confirmed : 10/05/21          Patient chooses bed at: [facility] Patient to be transferred to facility by: [place] Name of family member notified: Spouse Patient and family notified of of transfer: 10/05/21  Discharge Plan and Services In-house Referral: Clinical Social Work Discharge Planning Services: CM Consult                                 Social Determinants of Health (SDOH) Interventions     Readmission Risk Interventions    07/19/2019    1:50 PM 07/12/2019    4:57 PM 04/05/2019    2:18 PM  Readmission Risk Prevention Plan  Transportation Screening Complete Complete Complete  Medication Review Press photographer) Complete  Complete  PCP or Specialist appointment within 3-5 days of discharge  Patient refused    [place] or [place] Complete    Palliative Care Screening Not Applicable  Not [place] Not Applicable Not Applicable Patient Refused

## 2021-10-05 NOTE — Progress Notes (Signed)
Report called to [facility] and given to receiving nurse Blanch Media, LPN.  AVS to be placed in discharge packet.  PTAR to transport.

## 2021-10-05 NOTE — Plan of Care (Signed)
Problem: Education: Goal: Knowledge of disease or condition will improve 10/05/2021 1246 by Sherlynn Carbon, RN Outcome: Adequate for Discharge 10/05/2021 1038 by Sherlynn Carbon, RN Outcome: Progressing Goal: Knowledge of secondary prevention will improve (SELECT ALL) 10/05/2021 1246 by Sherlynn Carbon, RN Outcome: Adequate for Discharge 10/05/2021 1038 by Sherlynn Carbon, RN Outcome: Progressing Goal: Knowledge of patient specific risk factors will improve (INDIVIDUALIZE FOR PATIENT) 10/05/2021 1246 by Sherlynn Carbon, RN Outcome: Adequate for Discharge 10/05/2021 1038 by Sherlynn Carbon, RN Outcome: Progressing Goal: Individualized Educational Video(s) Outcome: Adequate for Discharge   Problem: Coping: Goal: Will verbalize positive feelings about self 10/05/2021 1246 by Sherlynn Carbon, RN Outcome: Adequate for Discharge 10/05/2021 1038 by Sherlynn Carbon, RN Outcome: Progressing Goal: Will identify appropriate support needs 10/05/2021 1246 by Sherlynn Carbon, RN Outcome: Adequate for Discharge 10/05/2021 1038 by Sherlynn Carbon, RN Outcome: Progressing   Problem: Health Behavior/Discharge Planning: Goal: Ability to manage health-related needs will improve Outcome: Adequate for Discharge   Problem: Self-Care: Goal: Ability to participate in self-care as condition permits will improve Outcome: Adequate for Discharge Goal: Verbalization of feelings and concerns over difficulty with self-care will improve Outcome: Adequate for Discharge Goal: Ability to communicate needs accurately will improve Outcome: Adequate for Discharge   Problem: Nutrition: Goal: Risk of aspiration will decrease Outcome: Adequate for Discharge Goal: Dietary intake will improve Outcome: Adequate for Discharge   Problem: Intracerebral Hemorrhage Tissue Perfusion: Goal: Complications of Intracerebral Hemorrhage will be minimized Outcome: Adequate for Discharge   Problem: Education: Goal:  Knowledge of General Education information will improve Description: Including pain rating scale, medication(s)/side effects and non-pharmacologic comfort measures Outcome: Adequate for Discharge   Problem: Health Behavior/Discharge Planning: Goal: Ability to manage health-related needs will improve Outcome: Adequate for Discharge   Problem: Clinical Measurements: Goal: Ability to maintain clinical measurements within normal limits will improve Outcome: Adequate for Discharge Goal: Will remain free from infection Outcome: Adequate for Discharge Goal: Diagnostic test results will improve Outcome: Adequate for Discharge Goal: Respiratory complications will improve Outcome: Adequate for Discharge Goal: Cardiovascular complication will be avoided Outcome: Adequate for Discharge   Problem: Activity: Goal: Risk for activity intolerance will decrease Outcome: Adequate for Discharge   Problem: Nutrition: Goal: Adequate nutrition will be maintained Outcome: Adequate for Discharge   Problem: Coping: Goal: Level of anxiety will decrease Outcome: Adequate for Discharge   Problem: Elimination: Goal: Will not experience complications related to bowel motility Outcome: Adequate for Discharge Goal: Will not experience complications related to urinary retention Outcome: Adequate for Discharge   Problem: Pain Managment: Goal: General experience of comfort will improve Outcome: Adequate for Discharge   Problem: Safety: Goal: Ability to remain free from injury will improve Outcome: Adequate for Discharge   Problem: Skin Integrity: Goal: Risk for impaired skin integrity will decrease Outcome: Adequate for Discharge   Problem: Education: Goal: Ability to describe self-care measures that may prevent or decrease complications (Diabetes Survival Skills Education) will improve Outcome: Adequate for Discharge Goal: Individualized Educational Video(s) Outcome: Adequate for Discharge    Problem: Coping: Goal: Ability to adjust to condition or change in health will improve Outcome: Adequate for Discharge   Problem: Fluid Volume: Goal: Ability to maintain a balanced intake and output will improve Outcome: Adequate for Discharge   Problem: Health Behavior/Discharge Planning: Goal: Ability to identify and utilize available resources and services will improve Outcome: Adequate for Discharge Goal: Ability to manage health-related needs will improve Outcome: Adequate for Discharge  Problem: Metabolic: Goal: Ability to maintain appropriate glucose levels will improve Outcome: Adequate for Discharge   Problem: Nutritional: Goal: Maintenance of adequate nutrition will improve Outcome: Adequate for Discharge Goal: Progress toward achieving an optimal weight will improve Outcome: Adequate for Discharge   Problem: Skin Integrity: Goal: Risk for impaired skin integrity will decrease Outcome: Adequate for Discharge   Problem: Tissue Perfusion: Goal: Adequacy of tissue perfusion will improve Outcome: Adequate for Discharge

## 2021-10-05 NOTE — Discharge Summary (Signed)
Physician Discharge Summary  Janet Mitchell ONG:[id number] DOB: 04-Sep-1965 DOA: 08/23/2021  PCP: Lindell Spar, MD  Admit date: 08/23/2021 Discharge date: 10/05/2021  Time spent: 40 minutes  Recommendations for Outpatient Follow-up:  Follow outpatient CBC/CMP  Follow with neurology outpatient, follow with neurosurgery outpatient Long term BP goal normotensive, adjust BP meds as indicated (keep SBP <160) Follow SLP recs outpatient  Pulm follow up if needed with regards to decannulated trach site Follow with renal   Discharge Diagnoses:  Principal Problem:   Intracranial hemorrhage ([place]) Active Problems:   Respiratory failure requiring intubation ([place])   History of tracheostomy   ESRD (end stage renal disease) on dialysis ([place])   Dysphagia   Essential hypertension   Hyponatremia   Anemia   Type 2 diabetes mellitus ([place])   Obesity, morbid, BMI 50 or higher ([place])   Thrombocytopenia ([place])   Discharge Condition: stable  Diet recommendation: dysphagia 3, thin, meds with puree  Filed Weights   10/04/21 1500 10/04/21 1700 10/05/21 0500  Weight: (!) 217 kg (!) 209.1 kg 96.5 kg    History of present illness:  Brief Narrative (HPI from H&P)  56 y.o. female with history of ESRD on HD MWF, CHF, poorly controlled HTN, DM presenting with nausea, vomiting, left side weakness present upon waking.  Scented to [facility] on 08/23/2021 with nausea vomiting left-sided weakness, work-up showed right-sided thalamic hemorrhage she was intubated and transferred to [facility] under ICU care for further treatment.  She was seen by [facility], nephrology for HD, neurology and neurosurgery, external ventricular drain placement by neurosurgery on 08/23/2021.  She also was intubated, trached, during her hospital stay she had tracheostomy site infection for which she is on antibiotic.  She has been transferred to hospitalist service on 09/13/2021 on day 20 one of her hospital stay with plan of  going to [facility] versus SNF.     Events procedures   5/28 > Presents to [facility] ED, intubated. 5/29 > Transferred to [facility] Echocardiogram with EF 60%, no acute changes. 5/30 > HD, more awake, following commands with RUE/RLE 6/1 No issues overnight, she remains alert and interactive on vent.  Extubated 6/2 failed extubation requiring reintubation. Trached. 6/4 Placed on ATC yesterday afternoon and has tolerated well since 6/5: EVD raised to 20 cm H20 6/7: Failed EVD clamping trial after developing emesis and headache 6/8: Failed EVD clamping due to emesis 6/9: Increase EVD pressure to 25 6/12 back on vent due to hypoxia from mucus plugging, started back on antibiotics 6/14 VP shunt placed. Antibiotics adjusted based on cultures 6/17-has been off the vent since 09/10/2021, did not require to be on overnight. 6/19.  Transferred to hospitalist service 09/16/2021 PEG tube placed by IR Dr. Anselm Pancoast 6/28 decannulated 7/5 tolerating dialysis in chair Plan for [facility] 7/11  Hospital Course:  Assessment and Plan: * Intracranial hemorrhage ([place]) Head CT 5/29 with 5 cc acute hematoma in R thalamus with intraventricular extension S/p external ventricular drain placement by neurosurgery on 5/29 Subsequent VP shunt placement 6/14 Hemorrhage was thought due to uncontrolled hypertension.  Brain bleed contributing to severe left-sided weakness, dysphagia along with toxic encephalopathy Was seen by neurosurgery and neurology -> follow outpatient Avoid SBP >160 -> adjustments in BP meds at discharge S/p peg placement on 6/22 Awaiting SNF -> plan for d/c to SNF today  History of tracheostomy decannulated 6/28 Follow up with pulmonology prn  Respiratory failure requiring intubation ([place]) Now with trach collar with tracheostomy site infection.  She has finished her course of antibiotics for treatment of Enterobacter and Staph aureus, with antibiotic stop date 09/15/2021 per PCCM team.  Tracheostomy deferred to PCCM.   There is Arial Galligan question of ongoing aspiration of tube feeds also while getting PEG tube placed she had emesis and IR physician was suspicious of aspiration during the procedure on 09/16/2021.    She had increase in her tracheostomy site secretions, chest x-ray and CT scan showed bilateral infiltrates, although she has no fever or leukocytosis but due to witnessed aspiration during PEG tube placement and radiological findings will give her 5 days of Unasyn carting on 09/17/2021 and monitor.  Clinically gradual improvement.  Secretions also improved after addition of scopolamine patch on 09/20/2021.    S/p MBS by speech, recommending dysphagia 13, thin liquids, meds crushed with puree -> continue SLP follow up outpatient  decannulated 6/28  Continue to monitor for any signs of ongoing aspiration in the future.   Dysphagia SLP recommending dysphagia 3, thin liquids, meds crushed with puree S/p PEG placement RD recommending bolus tube feeds via peg (calorie count limited, limited meal ticket documentation - pt and husband report poor appetite and poor PO intake)  ESRD (end stage renal disease) on dialysis ([place]) Follow per renal  Hyponatremia Improved, follow outpatient with renal with dialysis  Essential hypertension BP trending on higher side at times (at times wide fluctuations, but generally trending higher) Increase amlodipine to 5 mg and coreg to 6.25 mg twice daily at discharge Continue to adjust as needed Long term goal normotensive (keep SBP <160)   Anemia Elevated ferritin Suspect AOCD Will check b12/folate -> b12 elevated, folate wnl  Type 2 diabetes mellitus (HCC) a1c 6.2 follow  Obesity, morbid, BMI 50 or higher (HCC) noted  Thrombocytopenia ([place]) Noted, stable       Procedures: Echo IMPRESSIONS     1. Left ventricular ejection fraction, by estimation, is 60 to 65%. The  left ventricle has normal function. The left ventricle has no regional  wall motion  abnormalities. There is severe left ventricular hypertrophy.  Left ventricular diastolic parameters   are consistent with Grade II diastolic dysfunction (pseudonormalization).  Elevated left atrial pressure.   2. Right ventricular systolic function is normal. The right ventricular  size is normal.   3. Left atrial size was moderately dilated.   4. The mitral valve is normal in structure. Trivial mitral valve  regurgitation. No evidence of mitral stenosis.   5. The aortic valve is tricuspid. Aortic valve regurgitation is not  visualized. Aortic valve sclerosis is present, with no evidence of aortic  valve stenosis.   6. The inferior vena cava is normal in size with greater than 50%  respiratory variability, suggesting right atrial pressure of 3 mmHg.   6/23 IMPRESSION: Successful fluoroscopic guided percutaneous gastrostomy tube placement.   5/29 external ventricular drain 6/2 trach 6/14 VP shunt placed   Consultations: Neurology IR Renal NSGY PCCM  Discharge Exam: Vitals:   10/05/21 0346 10/05/21 0700  BP: (!) 162/84 (!) 164/87  Pulse: 80 87  Resp:  16  Temp: 98.2 F (36.8 C) 98.6 F (37 C)  SpO2: 100% 100%   No complaints other than being cold Discussed d/c plan with husband  General: No acute distress. Cardiovascular: RRR Lungs: unlabored Abdomen: Soft, nontender, nondistended- peg Neurological: L sided weakness Extremities: No clubbing or cyanosis. No edema.  Discharge Instructions   Discharge Instructions     Ambulatory referral to Neurology   Complete by: As  directed    An appointment is requested in approximately: 4 weeks   Call MD for:  difficulty breathing, headache or visual disturbances   Complete by: As directed    Call MD for:  extreme fatigue   Complete by: As directed    Call MD for:  hives   Complete by: As directed    Call MD for:  persistant dizziness or light-headedness   Complete by: As directed    Call MD for:  persistant nausea  and vomiting   Complete by: As directed    Call MD for:  redness, tenderness, or signs of infection (pain, swelling, redness, odor or green/yellow discharge around incision site)   Complete by: As directed    Call MD for:  severe uncontrolled pain   Complete by: As directed    Call MD for:  temperature >100.4   Complete by: As directed    Diet - low sodium heart healthy   Complete by: As directed    Discharge instructions   Complete by: As directed    You were seen with Alphonsus Doyel brain bleed.  You were seen by neurosurgery and had an external ventricular drain placed on 08/23/2021.  You had Kimoni Pagliarulo ventriculoperitoneal shunt placed on 6/14.  You had Katerra Ingman long and complicated course including an ICU stay and ultimately had to have Ingri Diemer trach and peg tube placed.  Your trach has been removed since 6/28.  You continue to require tube feeds with Collier Bohnet poor appetite overall.  We're planning to send you to skilled nursing facility today.  We're adjusting your blood pressure medicines at discharge and they'll need to continue to be followed outpatient and adjusted as necessary.    You'll need continued follow up with neurology and neurosurgery as an outpatient.  Speech therapy should continue to follow with you at the skilled nursing facility.  Return for new, recurrent, or worsening symptoms.  Please ask your PCP to request records from this hospitalization so they know what was done and what the next steps will be.   Increase activity slowly   Complete by: As directed    No wound care   Complete by: As directed       Allergies as of 10/05/2021   No Known Allergies      Medication List     STOP taking these medications    aspirin EC 81 MG tablet   calcium acetate 667 MG capsule Commonly known as: PHOSLO   cholestyramine 4 g packet Commonly known as: QUESTRAN   ciprofloxacin 500 MG tablet Commonly known as: Cipro   hydrALAZINE 10 MG tablet Commonly known as: APRESOLINE   labetalol 200 MG  tablet Commonly known as: NORMODYNE   losartan 100 MG tablet Commonly known as: COZAAR   psyllium 58.6 % packet Commonly known as: METAMUCIL       TAKE these medications    Accu-Chek Guide test strip Generic drug: glucose blood Use as instructed   Accu-Chek Guide w/Device Kit 1 Piece by Does not apply route as directed.   Accu-Chek Softclix Lancets lancets Please use it to check blood glucose twice daily and PRN.   amantadine 50 MG/5ML solution Commonly known as: SYMMETREL Place 20 mLs (200 mg total) into feeding tube once Daana Petrasek week. Start taking on: October 11, 2021   amLODipine 2.5 MG tablet Commonly known as: NORVASC Place 2 tablets (5 mg total) into feeding tube daily. Start taking on: October 06, 2021   calcitRIOL 0.5 MCG capsule Commonly  known as: ROCALTROL Place 2 capsules (1 mcg total) into feeding tube every Monday, Wednesday, and Friday. Start taking on: October 06, 2021 What changed:  how much to take how to take this when to take this additional instructions   carvedilol 6.25 MG tablet Commonly known as: COREG Place 1 tablet (6.25 mg total) into feeding tube 2 (two) times daily with Nickola Lenig meal.   cetirizine 10 MG tablet Commonly known as: ZYRTEC TAKE 1 TABLET BY MOUTH ONCE DAILY.   feeding supplement (NEPRO CARB STEADY) Liqd Place 237 mLs into feeding tube 5 (five) times daily.   fiber Pack packet Place 1 packet into feeding tube 2 (two) times daily.   Iodosorb 0.9 % gel Generic drug: cadexomer iodine Apply to left foot ulcer once daily. What changed:  how much to take how to take this when to take this additional instructions   pantoprazole sodium 40 mg Commonly known as: PROTONIX Place 40 mg into feeding tube daily.   polyvinyl alcohol 1.4 % ophthalmic solution Commonly known as: LIQUIFILM TEARS Place 1 drop into both eyes as needed for dry eyes.   RENA-VITE PO Take 1 tablet by mouth daily.   scopolamine 1 MG/3DAYS Commonly known as:  TRANSDERM-SCOP Place 1 patch (1.5 mg total) onto the skin every 3 (three) days. Start taking on: October 08, 2021   sevelamer carbonate 0.8 g Pack packet Commonly known as: RENVELA Place 0.8 g into feeding tube 3 (three) times daily with meals.   Tylenol 325 MG tablet Generic drug: acetaminophen Take 650 mg by mouth every 6 (six) hours as needed (pain).       No Known Allergies  Follow-up Information     Vallarie Mare, MD Follow up.   Specialty: Neurosurgery Why: call to arrange follow up Contact information: [place]  [phone]                  The results of significant diagnostics from this hospitalization (including imaging, microbiology, ancillary and laboratory) are listed below for reference.    Significant Diagnostic Studies: DG Swallowing Func-Speech Pathology  Result Date: 09/21/2021 Table formatting from the original result was not included. Images from the original result were not included. Objective Swallowing Evaluation: Type of Study: MBS-Modified Barium Swallow Study  Patient Details Name: Janet Mitchell MRN: [id number] Date of Birth: May 30, 1965 Today's Date: 09/21/2021 Time: SLP Start Time (ACUTE ONLY): 0910 -SLP Stop Time (ACUTE ONLY): 0940 SLP Time Calculation (min) (ACUTE ONLY): 30 min Past Medical History: Past Medical History: Diagnosis Date  Abdominal wall hematoma 12/26/2019  Anemia   Arthritis   CHF (congestive heart failure) ([place])   pt states she has been cleared of heart failure/disease  Chronic cholecystitis with calculus   COVID-19 03/26/2019  COVID-19 virus infection 03/2019  ESRD (end stage renal disease) on dialysis [facility])   Dialysis M-W-F  Headache   "Braulio Kiedrowski few/wk" (03/09/2018) - no longer having these  History of blood transfusion 10/2017  "low blood count" (03/09/2018)  Hypertension   Seasonal allergies   Spinal headache   Type II diabetes mellitus ([place])  Past Surgical History: Past Surgical History:  Procedure Laterality Date  AMPUTATION TOE Left 2013  Great toe  AV FISTULA PLACEMENT Left 07/22/2019  Procedure: LEFT ARM BASILIC ARTERIOVENOUS (AV) FISTULA CREATION;  Surgeon: Rosetta Posner, MD;  Location: [place];  Service: Vascular;  Laterality: Left;  [place] Left 10/17/2019  Procedure: LEFT SECOND STAGE [place];  Surgeon: Rosetta Posner, MD;  Location: [place];  Service: Vascular;  Laterality: Left;  BIOPSY  10/24/2018  Procedure: BIOPSY;  Surgeon: Daneil Dolin, MD;  [facility];  Service: Endoscopy;;  right and left colon  CATARACT EXTRACTION W/ INTRAOCULAR LENS IMPLANT Right   [place]; Jette N/[place] Brogden 03/09/2018  Procedure: LAPAROSCOPIC CHOLECYSTECTOMY WITH INTRAOPERATIVE CHOLANGIOGRAM ERAS PATHWAY;  Surgeon: Donnie Mesa, MD;  Location: [place];  Service: General;  Laterality: N/Morning [place];  COLONOSCOPY N/Jabier Deese 10/24/2018  Procedure: COLONOSCOPY;  Surgeon: Daneil Dolin, MD;  [facility];  Service: Endoscopy;  Laterality: N/Bleu Moisan;  1:00pm  EYE SURGERY Left 05/15/2018  Removal of blood in the globe (due to DM)  FLEXIBLE SIGMOIDOSCOPY N/Tykesha Konicki 11/24/2017  Procedure: FLEXIBLE SIGMOIDOSCOPY;  Surgeon: Daneil Dolin, MD;  [facility];  Service: Endoscopy;  Laterality: N/Fiorella Hanahan;  IR FLUORO GUIDE CV LINE RIGHT  07/17/2019  IR GASTROSTOMY TUBE MOD SED  09/16/2021  IR PARACENTESIS  07/09/2019  IR US GUIDE VASC ACCESS RIGHT  07/17/2019  LAPAROSCOPIC CHOLECYSTECTOMY  03/09/2018  LAPAROSCOPIC REVISION VENTRICULAR-PERITONEAL (V-P) SHUNT  09/08/2021  Procedure: LAPAROSCOPIC ASSISTANCE FOR  VENTRICULAR-PERITONEAL (V-P) SHUNT PLACEMENT ;  Surgeon: Vallarie Mare, MD;  [facility];  Service: Neurosurgery;;  PERITONEAL CATHETER INSERTION  2017  POLYPECTOMY  10/24/2018  Procedure: POLYPECTOMY;  Surgeon: Daneil Dolin, MD;  [facility];  Service: Endoscopy;;  TOTAL HIP ARTHROPLASTY Left 1997  VENTRICULOPERITONEAL SHUNT N/Inita Uram 09/08/2021   Procedure: SHUNT INSERTION VENTRICULAR-PERITONEAL;  Surgeon: Vallarie Mare, MD;  Location: [place];  Service: Neurosurgery;  Laterality: N/Joshaua Epple; HPI: Patient is Janet Mitchell 56 y.o. female who presented to [facility] ER as Londell Noll code stroke with c/o nausea and vomiting upon waking up in the morning along with left sided weakness. She was communicative at [facility] but during stay awaiting [facility] she required emergent intubation due to inability to protect airway. 6/1 CXR showed Diffuse bilateral lung opacities may be due to pulmonary edema or multifocal pneumonia. MRI brain showed right thalamic intraparenchymal hemorrhage  with surrounding edema. She was extubated on 6/1 and although initially tolerated well, quickly was seen with poor, ineffective cough, copious secretions unable to clear independently but in no obvious distress and oxygen saturations 100% on 4L nasal cannula. She was reintubated on 6/2 to tracheostomy #6 cuffed Shiley. Pt with PMH: ESRD on HD MWF, CHF, HTN, DM.  Subjective: Pt awake, alert, pleasant, participative  Recommendations for follow up therapy are one component of Chevon Laufer multi-disciplinary discharge planning process, led by the attending physician.  Recommendations may be updated based on patient status, additional functional criteria and insurance authorization. Assessment / Plan / Recommendation   09/21/2021  10:13 AM Clinical Impressions Clinical Impression Pt presents with Kenlyn Lose moderate oral dysphagia 2/2 L sided weakeness.  There was anterior loss of thin liquid x1 2/2 decreased labial seal. With simulated ground consistency there was significant oral retention of bolus on L side.  During pill simulation, pt attempted to masticate tablet mixed with puree.  Pharyngeal deficits were minimal with slightly delayed swallow noted intermittently and reduced tongue base retraction.  These deficits resulted in trace, transient, very shallow penetration of nectar thick liquid during the swallow, and trace valllecula  residue.  There was no aspiration of any consistencies trialed.  PO trials of puree and thin liquid by straw were given with and without PMV in place with no penetratin or aspiration.  Pt was noted to take small  sips and was placed in upright position in chair for fluoroscopy and these conditions should be continued with regular PO intake. PMV placement is recommended and encouraged for PO intake, but is not strictly necessary.  Recommend puree diet with thin liquid by straw with aspiration precautions as noted below. SLP Visit Diagnosis Dysphagia, oropharyngeal phase (R13.12) Impact on safety and function Mild aspiration risk     09/21/2021  10:13 AM Treatment Recommendations Treatment Recommendations Therapy as outlined in treatment plan below     09/21/2021  10:19 AM Prognosis Prognosis for Safe Diet Advancement Good   09/21/2021  10:13 AM Diet Recommendations SLP Diet Recommendations Dysphagia 1 (Puree) solids;Thin liquid Liquid Administration via Straw Medication Administration Crushed with puree Compensations Minimize environmental distractions;Slow rate;Small sips/bites;Monitor for anterior loss;Follow solids with liquid to reduce oral residue; Have suction available to assist with oral clearance;Liquid by straw to reduce anterior loss;Check L side of oral cavity for pocketing Postural Changes Seated upright at 90 degrees     09/21/2021  10:13 AM Other Recommendations Oral Care Recommendations Oral care BID Other Recommendations Have oral suction available;Place PMSV during PO intake Follow Up Recommendations Acute inpatient rehab (3hours/day) Assistance recommended at discharge Frequent or constant Supervision/Assistance Functional Status Assessment Patient has had Bob Daversa recent decline in their functional status and demonstrates the ability to make significant improvements in function in Tsuyako Jolley reasonable and predictable amount of time.   09/21/2021  10:13 AM Frequency and Duration  Speech Therapy Frequency (ACUTE ONLY)  min 2x/week Treatment Duration 2 weeks     09/21/2021  10:01 AM Oral Phase Oral Phase Impaired Oral - Honey Teaspoon WFL Oral - Nectar Teaspoon WFL Oral - Nectar Straw WFL Oral - Thin Straw Right anterior bolus loss;Left anterior bolus loss Oral - Puree Lingual pumping Oral - Mech Soft Reduced posterior propulsion;Lingual pumping;Left pocketing in lateral sulci;Holding of bolus Oral - Pill Reduced posterior propulsion;Holding of bolus;Impaired mastication    09/21/2021  10:03 AM Pharyngeal Phase Pharyngeal- Honey Teaspoon Reduced tongue base retraction Pharyngeal Material does not enter airway Pharyngeal- Nectar Teaspoon Delayed swallow initiation-vallecula;Reduced tongue base retraction Pharyngeal Material enters airway, remains ABOVE vocal cords then ejected out Pharyngeal- Nectar Straw Delayed swallow initiation-vallecula;Reduced tongue base retraction Pharyngeal Material enters airway, remains ABOVE vocal cords then ejected out Pharyngeal- Thin Straw Delayed swallow initiation-vallecula;Reduced tongue base retraction Pharyngeal Material does not enter airway Pharyngeal- Puree Reduced tongue base retraction Pharyngeal Material does not enter airway Pharyngeal- Mechanical Soft Reduced tongue base retraction Pharyngeal Material does not enter airway Pharyngeal- Pill WFL Pharyngeal Material does not enter airway     No data to display    Celedonio Savage, MA, [place] Office: [phone] 09/21/2021, 10:31 AM                     DG Chest Port 1 View  Result Date: 09/20/2021 CLINICAL DATA:  016010 shortness of breath EXAM: PORTABLE CHEST 1 VIEW COMPARISON:  September 18, 2021 FINDINGS: Again seen are the tracheostomy tube and right-sided VP shunt catheter. The heart size and mediastinal contours are within normal limits. Again seen is the diffuse interstitial/reticulonodular changes throughout the lungs without significant interval change. Low lung volumes. No focal consolidation or pleural  effusion. The visualized skeletal structures are unremarkable. IMPRESSION: There has been no significant interval change in diffuse interstitial/reticulonodular opacities throughout the lungs. No new finding. Electronically Signed   By: Frazier Richards M.D.   On: 09/20/2021 08:02   DG Chest Port 1  View  Result Date: 09/18/2021 CLINICAL DATA:  Shortness of breath EXAM: PORTABLE CHEST 1 VIEW COMPARISON:  09/06/2021 FINDINGS: Fine interstitial or micronodular opacity bilaterally is stable. Somewhat improved inflation in the lower lungs, although still Tysen Roesler low volume chest. Unremarkable heart size and mediastinal contours accounting for rotation. Tracheostomy tube in place. VP shunt catheter over the right chest. IMPRESSION: Mildly improved aeration but still low volume chest with generalized airspace or interstitial opacity. No new abnormality. Electronically Signed   By: Jorje Guild M.D.   On: 09/18/2021 06:20   IR GASTROSTOMY TUBE MOD SED  Result Date: 09/17/2021 INDICATION: 56 year old with stroke and needs supportive care. EXAM: PERCUTANEOUS GASTROSTOMY TUBE WITH FLUOROSCOPIC GUIDANCE Physician: Stephan Minister. Anselm Pancoast, MD MEDICATIONS: Ancef 2 g; Antibiotics were administered within 1 hour of the procedure. Glucagon 1 mg ANESTHESIA/SEDATION: Moderate (conscious) sedation was employed during this procedure. Claron Rosencrans total of Versed 1.65m and fentanyl 25 mcg was administered intravenously at the order of the provider performing the procedure. Total intra-service moderate sedation time: 35 minutes. Patient's level of consciousness and vital signs were monitored continuously by radiology nurse throughout the procedure under the supervision of the provider performing the procedure. FLUOROSCOPY: Radiation Exposure Index (as provided by the fluoroscopic device): 1572mGy Kerma COMPLICATIONS: None immediate. PROCEDURE: Informed consent was obtained for Tomeshia Pizzi percutaneous gastrostomy tube. The patient was placed on the interventional  table. Fluoroscopy demonstrated oral contrast in the transverse colon. An orogastric tube was placed with fluoroscopic guidance. The anterior abdomen was prepped and draped in sterile fashion. Maximal barrier sterile technique was utilized including caps, mask, sterile gowns, sterile gloves, sterile drape, hand hygiene and skin antiseptic. Stomach was inflated with air through the orogastric tube. The skin and subcutaneous tissues were anesthetized with 1% lidocaine. Seynabou Fults 17 gauge needle was directed into the distended stomach with fluoroscopic guidance. Kilyn Maragh wire was advanced into the stomach and Reiko Vinje T-tact was deployed. Markcus Lazenby 9-French vascular sheath was placed and the orogastric tube was snared using Lumi Winslett Goos[place]ck snare device. The orogastric tube and snare were pulled out of the patient's mouth. The snare device was connected to Maleeyah Mccaughey 20-French gastrostomy tube. The snare device and gastrostomy tube were pulled through the patient's mouth and out the anterior abdominal wall. The gastrostomy tube was cut to an appropriate length. Contrast injection through gastrostomy tube confirmed placement within the stomach. Fluoroscopic images were obtained for documentation. The gastrostomy tube was flushed with normal saline. FINDINGS: The abdomen was evaluated with ultrasound following the procedure. The gastrostomy tube was noted to be in close proximity to the prominent left hepatic lobe. It appeared that the gastrostomy tube was not traversing the liver but Halley Shepheard follow-up CT was performed in order to confirm this finding. Contrast injection confirmed placement in the stomach. IMPRESSION: Successful fluoroscopic guided percutaneous gastrostomy tube placement. Electronically Signed   By: AMarkus DaftM.D.   On: 09/17/2021 08:41   CT ABDOMEN WO CONTRAST  Result Date: 09/16/2021 CLINICAL DATA:  Gastrostomy tube just placed. Tube was difficult to pull through stomach and close proximity to the liver. Confirm that gastrostomy tube does not  transverse liver. EXAM: CT ABDOMEN WITHOUT CONTRAST TECHNIQUE: Multidetector CT imaging of the abdomen was performed following the standard protocol without IV contrast. RADIATION DOSE REDUCTION: This exam was performed according to the departmental dose-optimization program which includes automated exposure control, adjustment of the mA and/or kV according to patient size and/or use of iterative reconstruction technique. COMPARISON:  CT AP, 09/14/2021.  IR fluoroscopy, earlier same day.  FINDINGS: Lower chest: Innumerable bibasilar tree-in-bud nodularities, greatest within the dependent lower lobes. Nathali Vent severe burden of coronary atherosclerosis is present. Hepatobiliary: Normal noncontrast appearance of the liver. Prominent LEFT hepatic lobe. Status post cholecystectomy. No biliary dilatation. Pancreas: No pancreatic ductal dilatation or surrounding inflammatory changes. Spleen: Normal in size without focal abnormality. Adrenals/Urinary Tract: Adrenal glands are unremarkable. Atrophic kidneys consistent with medical renal disease. No renal calculi, focal lesion, or hydronephrosis. Stomach/Bowel: Imaged portions of bowel nonobstructed. Contrast opacification of proximal stomach and imaged colon. New percutaneous gastrostomy tube at the epigastrium. Course and trajectory is inferior to the prominent LEFT hepatic lobe. Tube tip is well-positioned at the gastric antrum. No intraperitoneal free air. No focal drainable collection. Vascular/Lymphatic: Aortic and severe extent of medium vessel atherosclerosis. No enlarged abdominal lymph nodes. Other: RIGHT VP shunt seen coursing along the anterior chest and into the abdomen at the RIGHT upper quadrant. Catheter tip extends outside the field of view into the LEFT pelvis. Rounded partially-calcified RIGHT superior breast subcutaneous lesion. No abdominal wall hernia. Multifocal anterior abdominal wall contusions, likely injection sites. Musculoskeletal: No acute osseous  findings. IMPRESSION: 1. Pulmonary findings consistent with multifocal pneumonia, and likely aspiration in etiology. 2. New and well-positioned percutaneous gastrostomy tube with tip at the gastric antrum as described above. No CT evidence of acute complication. 3. Aortic Atherosclerosis (ICD10-I70.0) and severe medium vessel atherosclerosis consistent with medical renal disease. Additional incidental, chronic and senescent findings as above. These results were conveyed by by telephone at the time of interpretation on 09/16/2021 at 5:24 pm to provider ADAM HENN. Michaelle Birks, MD [facility] Radiology Electronically Signed   By: Michaelle Birks M.D.   On: 09/16/2021 17:38   DG Abd Portable 1V  Result Date: 09/16/2021 CLINICAL DATA:  Encounter for dysphagia EXAM: PORTABLE ABDOMEN - 1 VIEW COMPARISON:  Radiograph 09/02/2021 FINDINGS: Feeding tube tip overlies the distal stomach in the region of the pylorus. There is retained contrast material in the colon. There is no evidence of bowel obstruction. Vascular calcifications. No acute osseous abnormality. Bibasilar lung opacities, see recent chest radiograph. IMPRESSION: Feeding tube tip overlies the distal stomach in the region of the pylorus. No evidence of bowel obstruction. There is contrast material in the colon. Bibasilar lung opacities, partially visualized. Electronically Signed   By: Maurine Simmering M.D.   On: 09/16/2021 07:27   CT ABDOMEN PELVIS WO CONTRAST  Result Date: 09/14/2021 CLINICAL DATA:  eval for Perc g-tube placement EXAM: CT ABDOMEN AND PELVIS WITHOUT CONTRAST TECHNIQUE: Multidetector CT imaging of the abdomen and pelvis was performed following the standard protocol without IV contrast. RADIATION DOSE REDUCTION: This exam was performed according to the departmental dose-optimization program which includes automated exposure control, adjustment of the mA and/or kV according to patient size and/or use of  iterative reconstruction technique. COMPARISON:  July 26, 2019 FINDINGS: Lower chest: There are some interstitial changes seen. No consolidation, discrete pulmonary nodule or pleural effusion. Hepatobiliary: No focal liver abnormality is seen. Status post cholecystectomy. No biliary dilatation. Pancreas: Unremarkable. No pancreatic ductal dilatation or surrounding inflammatory changes. Spleen: Normal in size without focal abnormality. Adrenals/Urinary Tract: 8 mm lipomatous lesion in the left adrenal, stable. As before, kidneys are atrophied. 10 mm cyst in the right kidney, and is more conspicuous in the present study. Stomach/Bowel: There is an NG tube with its distal end at the distal gastric antrum/pyloric region. Stomach is located in the upper abdomen just inferior to the left lobe of the  liver. Transverse colon is inferior to the stomach. There is some stool seen throughout the colon. Bowel-gas pattern is nonobstructive. Mild diverticulosis of the sigmoid colon. Vascular/Lymphatic: Extensive atheromatous calcifications of the abdominal aorta and its branches including the splenic artery, bilateral renal arteries, celiac and SMA as well as iliac arteries. Reproductive: Uterus and bilateral adnexa are unremarkable. There is an IUD in place. Severe atheromatous calcifications of the uterine arteries and their branches. Other: There has been interval removal of the Tenckhoff peritoneal dialysis catheter with interval resolution of the heterogeneous large fluid collection in the ventral aspect of the abdomen and pelvis. There has been interval placement of Maddisen Vought VP shunt catheter. 5.5 cm x 2.6 cm soft tissue density at the right anterior pelvic wall, likely related to the previous Tenckhoff catheter placement. Musculoskeletal: Again seen are the postsurgical changes at the left hip joint. IMPRESSION: Body of the stomach is inferior to the left lobe of the liver and the transverse colon is inferior to the body of the  stomach. The stomach is amenable for percutaneous G-tube insertion. Atrophy of the bilateral kidneys. Extensive atheromatous calcifications of the abdominal aorta and its branches. Electronically Signed   By: Frazier Richards M.D.   On: 09/14/2021 15:05   DG CHEST PORT 1 VIEW  Result Date: 09/06/2021 CLINICAL DATA:  Fever and secretions. EXAM: PORTABLE CHEST 1 VIEW COMPARISON:  September 03 2021 FINDINGS: The heart size and mediastinal contours are stable. Tracheostomy tube, feeding tube are unchanged compared prior exam. Diffuse hazy opacity of bilateral lungs are identified stable. There is increased patchy consolidation of right lung base compared prior exam. There is no pleural effusion. The visualized skeletal structures are unremarkable. IMPRESSION: 1. Diffuse hazy opacity of bilateral lungs are identified stable. 2. Increased patchy consolidation of right lung base, suspicious for superimposed pneumonia. Electronically Signed   By: Abelardo Diesel M.D.   On: 09/06/2021 08:42   CT HEAD WO CONTRAST (5MM)  Result Date: 09/06/2021 CLINICAL DATA:  Follow-up right thalamic hemorrhage. EXAM: CT HEAD WITHOUT CONTRAST TECHNIQUE: Contiguous axial images were obtained from the base of the skull through the vertex without intravenous contrast. RADIATION DOSE REDUCTION: This exam was performed according to the departmental dose-optimization program which includes automated exposure control, adjustment of the mA and/or kV according to patient size and/or use of iterative reconstruction technique. COMPARISON:  Head CT dated 09/03/2021. FINDINGS: Brain: No significant interval change in the right thalamic hemorrhage with associated mass effect and mild compression of the third ventricle. There is interval increase in the size of the lateral ventricles compared to the CT of 09/03/2021. The lateral ventricles however appears similar in size to the CT of 08/23/2021. Right frontal approach ventriculostomy shunt with tip in the left  lateral ventricle. No new hemorrhage. No extra-axial fluid collection. Vascular: No hyperdense vessel or unexpected calcification. Skull: Right frontal burr hole and ventriculostomy. No acute calvarial pathology. Sinuses/Orbits: Diffuse mucoperiosteal thickening of paranasal sinuses with opacification of the sphenoid sinuses. No air-fluid. The mastoid air cells are clear. Iyani Dresner nasogastric tube is partially visualized. Other: None IMPRESSION: 1. No significant interval change in the right thalamic hemorrhage with associated mass effect and mild compression of the third ventricle. 2. Slight interval increase in the size of the lateral ventricles compared to CT of 09/03/2021 which may represent mild hydrocephalus. The lateral ventricles are however similar in size to the CT of 08/23/2021. Continued follow-up recommended. Right frontal approach ventriculostomy shunt with tip in the left lateral ventricle. Electronically Signed  By: Anner Crete M.D.   On: 09/06/2021 03:23    Microbiology: No results found for this or any previous visit (from the past 240 hour(s)).   Labs: Basic Metabolic Panel: Recent Labs  Lab 09/29/21 0218 10/01/21 0922 10/04/21 0754 10/05/21 0205 10/05/21 0634  NA 128* 126* 125* 130* 129*  K 4.6 4.3 5.0 4.1 3.9  CL 85* 87* 80* 87* 87*  CO2 '27 24 24 26 28  ' GLUCOSE 146* 166* 166* 146* 101*  BUN 67* 71* 104* 49* 52*  CREATININE 6.13* 6.33* 7.55* 4.62* 5.00*  CALCIUM 9.9 9.7 10.2 9.5 9.3  MG 2.4  --   --  2.3  --   PHOS 3.4 4.0  --  3.6 4.2   Liver Function Tests: Recent Labs  Lab 09/29/21 0218 10/01/21 0922 10/04/21 0754 10/05/21 0205 10/05/21 0634  AST 19  --  26 27  --   ALT 9  --  16 15  --   ALKPHOS 97  --  116 100  --   BILITOT 0.6  --  0.3 0.4  --   PROT 8.1  --  8.6* 7.6  --   ALBUMIN 3.0* 2.9* 3.3* 3.0* 2.9*   No results for input(s): "LIPASE", "AMYLASE" in the last 168 hours. No results for input(s): "AMMONIA" in the last 168 hours. CBC: Recent Labs   Lab 09/29/21 0218 10/01/21 0922 10/04/21 0754 10/05/21 0205 10/05/21 0634  WBC 5.0 4.5 5.1 4.5 4.2  NEUTROABS 2.5  --   --  2.3  --   HGB 11.7* 10.4* 12.7 10.4* 10.1*  HCT 37.0 32.9* 38.6 32.7* 31.6*  MCV 79.7* 78.9* 77.8* 79.2* 77.8*  PLT 145* 144* 133* 121* 132*   Cardiac Enzymes: No results for input(s): "CKTOTAL", "CKMB", "CKMBINDEX", "TROPONINI" in the last 168 hours. BNP: BNP (last 3 results) No results for input(s): "BNP" in the last 8760 hours.  ProBNP (last 3 results) No results for input(s): "PROBNP" in the last 8760 hours.  CBG: Recent Labs  Lab 10/04/21 0440 10/04/21 1147 10/04/21 2103 10/05/21 0139 10/05/21 0457  GLUCAP 171* 184* 121* 142* 106*       Signed:  Fayrene Helper MD.  [facility] 10/05/2021, 11:11 AM

## 2021-10-06 ENCOUNTER — Telehealth: Payer: Self-pay | Admitting: *Deleted

## 2021-10-06 NOTE — Telephone Encounter (Signed)
Transition Care Management Unsuccessful Follow-up Telephone Call  Date of discharge and from where:  10-06-21 [facility]  Attempts:  1st Attempt  Reason for unsuccessful TCM follow-up call: Patient is in rehab facility

## 2021-10-07 ENCOUNTER — Encounter: Payer: Self-pay | Admitting: *Deleted

## 2021-10-14 ENCOUNTER — Encounter: Payer: 59 | Admitting: Podiatry

## 2021-10-27 ENCOUNTER — Emergency Department (HOSPITAL_COMMUNITY): Payer: 59

## 2021-10-27 ENCOUNTER — Encounter (HOSPITAL_COMMUNITY): Payer: Self-pay

## 2021-10-27 ENCOUNTER — Other Ambulatory Visit: Payer: Self-pay

## 2021-10-27 ENCOUNTER — Emergency Department (HOSPITAL_COMMUNITY)
Admission: EM | Admit: 2021-10-27 | Discharge: 2021-10-27 | Disposition: A | Discharge status: Skilled Nursing Facility | Payer: 59 | Attending: Student | Admitting: Student

## 2021-10-27 DIAGNOSIS — Z79899 Other long term (current) drug therapy: Secondary | ICD-10-CM | POA: Diagnosis not present

## 2021-10-27 DIAGNOSIS — Z96642 Presence of left artificial hip joint: Secondary | ICD-10-CM | POA: Diagnosis not present

## 2021-10-27 DIAGNOSIS — R519 Headache, unspecified: Secondary | ICD-10-CM

## 2021-10-27 DIAGNOSIS — Z992 Dependence on renal dialysis: Secondary | ICD-10-CM | POA: Insufficient documentation

## 2021-10-27 DIAGNOSIS — N186 End stage renal disease: Secondary | ICD-10-CM | POA: Diagnosis not present

## 2021-10-27 DIAGNOSIS — I132 Hypertensive heart and chronic kidney disease with heart failure and with stage 5 chronic kidney disease, or end stage renal disease: Secondary | ICD-10-CM | POA: Insufficient documentation

## 2021-10-27 DIAGNOSIS — Z8616 Personal history of COVID-19: Secondary | ICD-10-CM | POA: Insufficient documentation

## 2021-10-27 DIAGNOSIS — E1122 Type 2 diabetes mellitus with diabetic chronic kidney disease: Secondary | ICD-10-CM | POA: Insufficient documentation

## 2021-10-27 DIAGNOSIS — M542 Cervicalgia: Secondary | ICD-10-CM | POA: Insufficient documentation

## 2021-10-27 DIAGNOSIS — I509 Heart failure, unspecified: Secondary | ICD-10-CM | POA: Diagnosis not present

## 2021-10-27 MED ORDER — PROCHLORPERAZINE EDISYLATE 10 MG/2ML IJ SOLN
10.0000 mg | Freq: Once | INTRAMUSCULAR | Status: AC
  Administered 2021-10-27: 10 mg via INTRAVENOUS
  Filled 2021-10-27: qty 2

## 2021-10-27 MED ORDER — DIPHENHYDRAMINE HCL 50 MG/ML IJ SOLN
25.0000 mg | Freq: Once | INTRAMUSCULAR | Status: AC
  Administered 2021-10-27: 25 mg via INTRAVENOUS
  Filled 2021-10-27: qty 1

## 2021-10-27 MED ORDER — LACTATED RINGERS IV BOLUS
500.0000 mL | Freq: Once | INTRAVENOUS | Status: AC
  Administered 2021-10-27: 500 mL via INTRAVENOUS

## 2021-10-27 NOTE — ED Provider Notes (Addendum)
[facility] Provider Note  CSN: [id number] Arrival date & time: 10/27/21 1000  Chief Complaint(s) Headache  HPI Javen Ridings Pinnix is a 56 y.o. female with PMH ESRD on hemodialysis Monday Wednesday Friday, CHF, HTN, T2DM recent admission for a thalamic hemorrhage requiring intubation and VP shunt placement and ultimate tracheostomy placement status post decannulation who presents emergency department for evaluation of a headache.  Patient states that she had a headache prior to dialysis today, had dialysis and is having a persistent headache.  Endorses frontal headache and neck pain.  Denies chest pain, shortness of breath, fever, vomiting or other systemic symptoms.  Questions answered via head nod as the patient is only minimally verbal.   Past Medical History Past Medical History:  Diagnosis Date   Abdominal wall hematoma 12/26/2019   Anemia    Arthritis    CHF (congestive heart failure) ([place])    pt states she has been cleared of heart failure/disease   Chronic cholecystitis with calculus    COVID-19 03/26/2019   COVID-19 virus infection 03/2019   ESRD (end stage renal disease) on dialysis [facility])    Dialysis M-W-F   Headache    "a few/wk" (03/09/2018) - no longer having these   History of blood transfusion 10/2017   "low blood count" (03/09/2018)   Hypertension    Seasonal allergies    Spinal headache    Type II diabetes mellitus ([place])    Patient Active Problem List   Diagnosis Date Noted   Thrombocytopenia ([place]) 10/04/2021   History of tracheostomy 09/22/2021   Dysphagia 09/22/2021   Respiratory failure requiring intubation ([place])    Intracranial hemorrhage ([place]) 08/23/2021   Proliferative diabetic retinopathy of right eye determined by examination ([place]) 06/29/2021   Stable treated proliferative diabetic retinopathy of left eye determined by examination associated with type 2 diabetes mellitus ([place]) 06/29/2021   URI (upper respiratory infection)  09/23/2020   Hyponatremia    ESRD (end stage renal disease) on dialysis ([place])    Essential hypertension 02/14/2019   Obesity, morbid, BMI 50 or higher ([place]) 02/14/2019   Abnormal nuclear stress test 11/02/2018   Rectal bleeding 08/27/2018   Diarrhea 05/22/2018   Anemia 03/25/2018   Chronic cholecystitis with calculus 03/09/2018   Type 2 diabetes mellitus ([place]) 03/09/2018   Pre-transplant evaluation for ESRD (end stage renal disease) 12/19/2017   Dependence on renal dialysis ([place]) 11/06/2017   Diabetic retinopathy ([place]) 01/23/2016   End stage renal disease on dialysis due to type 2 diabetes mellitus ([place]) 01/23/2016   Renal osteodystrophy 01/23/2016   Diabetic macular edema ([place]) 04/10/2015   Edema of lower extremity 04/10/2015   Paresthesia of lower extremity 04/10/2015   Recurrent falls 04/10/2015   Home Medication(s) Prior to Admission medications   Medication Sig Start Date End Date Taking? Authorizing Provider  acetaminophen (TYLENOL) 325 MG tablet Take 650 mg by mouth every 6 (six) hours as needed (pain).   Yes [provider]  amantadine (SYMMETREL) 50 MG/5ML solution Place 20 mLs (200 mg total) into feeding tube once a week. 10/11/21 11/10/21 Yes Elodia Florence., MD  amLODipine (NORVASC) 2.5 MG tablet Place 2 tablets (5 mg total) into feeding tube daily. 10/06/21 11/05/21 Yes Elodia Florence., MD  B Complex-C-Folic Acid (RENA-VITE PO) Take 1 tablet by mouth daily.   Yes [provider]  calcitRIOL (ROCALTROL) 0.5 MCG capsule Place 2 capsules (1 mcg total) into feeding tube every Monday, Wednesday, and Friday. 10/06/21  Yes Florene [place],  A Clint Lipps., MD  carvedilol (COREG) 6.25 MG tablet Place 1 tablet (6.25 mg total) into feeding tube 2 (two) times daily with a meal. 10/05/21 11/04/21 Yes Elodia Florence., MD  cetirizine (ZYRTEC) 10 MG tablet TAKE 1 TABLET BY MOUTH ONCE DAILY. 06/18/21  Yes Lindell Spar, MD  Nutritional Supplements (FEEDING  SUPPLEMENT, NEPRO CARB STEADY,) LIQD Place 237 mLs into feeding tube 5 (five) times daily. 10/05/21 11/04/21 Yes Elodia Florence., MD  pantoprazole sodium (PROTONIX) 40 mg Place 40 mg into feeding tube daily. 10/05/21 11/04/21 Yes Elodia Florence., MD  polyethylene glycol (MIRALAX / GLYCOLAX) 17 g packet Place 17 g into feeding tube daily.   Yes [provider]  polyvinyl alcohol (LIQUIFILM TEARS) 1.4 % ophthalmic solution Place 1 drop into both eyes as needed for dry eyes. 10/05/21 11/04/21 Yes Elodia Florence., MD  scopolamine (TRANSDERM-SCOP) 1 MG/3DAYS Place 1 patch (1.5 mg total) onto the skin every 3 (three) days. 10/08/21  Yes Elodia Florence., MD  sevelamer carbonate (RENVELA) 0.8 g PACK packet Place 0.8 g into feeding tube 3 (three) times daily with meals. 10/05/21 11/04/21 Yes Elodia Florence., MD  Accu-Chek Softclix Lancets lancets Please use it to check blood glucose twice daily and PRN. 06/03/21   Lindell Spar, MD  Blood Glucose Monitoring Suppl (ACCU-CHEK GUIDE) w/Device KIT 1 Piece by Does not apply route as directed. 02/28/19   Cassandria Anger, MD  cadexomer iodine (IODOSORB) 0.9 % gel Apply to left foot ulcer once daily. Patient not taking: Reported on 10/27/2021 10/23/20   Marzetta Board, DPM  fiber (NUTRISOURCE FIBER) PACK packet Place 1 packet into feeding tube 2 (two) times daily. Patient not taking: Reported on 10/27/2021 10/05/21 11/04/21  Elodia Florence., MD  glucose blood (ACCU-CHEK GUIDE) test strip Use as instructed 06/03/21   Lindell Spar, MD                                                                                                                                    Past Surgical History Past Surgical History:  Procedure Laterality Date   AMPUTATION TOE Left 2013   Great toe   AV FISTULA PLACEMENT Left 07/22/2019   Procedure: LEFT ARM BASILIC ARTERIOVENOUS (AV) FISTULA CREATION;  Surgeon: Rosetta Posner, MD;  Location: [place];  Service: Vascular;  Laterality: Left;   [place] Left 10/17/2019   Procedure: LEFT SECOND STAGE [place];  Surgeon: Rosetta Posner, MD;  Location: [place];  Service: Vascular;  Laterality: Left;   BIOPSY  10/24/2018   Procedure: BIOPSY;  Surgeon: Daneil Dolin, MD;  [facility];  Service: Endoscopy;;  right and left colon   CATARACT EXTRACTION W/ INTRAOCULAR LENS IMPLANT Right    [place]; [place] N/A 03/09/2018   Procedure: LAPAROSCOPIC  CHOLECYSTECTOMY WITH INTRAOPERATIVE CHOLANGIOGRAM ERAS PATHWAY;  Surgeon: Donnie Mesa, MD;  Location: [place];  Service: General;  Laterality: N/A;   COLONOSCOPY N/A 10/24/2018   Procedure: COLONOSCOPY;  Surgeon: Daneil Dolin, MD;  [facility];  Service: Endoscopy;  Laterality: N/A;  1:00pm   EYE SURGERY Left 05/15/2018   Removal of blood in the globe (due to DM)   FLEXIBLE SIGMOIDOSCOPY N/A 11/24/2017   Procedure: FLEXIBLE SIGMOIDOSCOPY;  Surgeon: Daneil Dolin, MD;  [facility];  Service: Endoscopy;  Laterality: N/A;   IR FLUORO GUIDE CV LINE RIGHT  07/17/2019   IR GASTROSTOMY TUBE MOD SED  09/16/2021   IR PARACENTESIS  07/09/2019   IR US GUIDE VASC ACCESS RIGHT  07/17/2019   LAPAROSCOPIC CHOLECYSTECTOMY  03/09/2018   LAPAROSCOPIC REVISION VENTRICULAR-PERITONEAL (V-P) SHUNT  09/08/2021   Procedure: LAPAROSCOPIC ASSISTANCE FOR  VENTRICULAR-PERITONEAL (V-P) SHUNT PLACEMENT ;  Surgeon: Vallarie Mare, MD;  Location: [facility] OR;  Service: Neurosurgery;;   PERITONEAL CATHETER INSERTION  2017   POLYPECTOMY  10/24/2018   Procedure: POLYPECTOMY;  Surgeon: Daneil Dolin, MD;  [facility];  Service: Endoscopy;;   TOTAL HIP ARTHROPLASTY Left 1997   VENTRICULOPERITONEAL SHUNT N/A 09/08/2021   Procedure: SHUNT INSERTION VENTRICULAR-PERITONEAL;  Surgeon: Vallarie Mare, MD;  Location: [place];  Service: Neurosurgery;  Laterality: N/A;   Family  History Family History  Problem Relation Age of Onset   Heart disease Mother    Thrombocytopenia Mother        TTP   Heart failure Father    Kidney disease Paternal Grandfather    Colon cancer Neg Hx     Social History Social History   Tobacco Use   Smoking status: Never    Passive exposure: Yes   Smokeless tobacco: Never  Vaping Use   Vaping Use: Never used  Substance Use Topics   Alcohol use: Never   Drug use: Never   Allergies Patient has no known allergies.  Review of Systems Review of Systems  Musculoskeletal:  Positive for neck pain.  Neurological:  Positive for headaches.    Physical Exam Vital Signs  I have reviewed the triage vital signs BP (!) 147/79   Pulse 78   Temp 97.8 F (36.6 C) (Oral)   Resp 18   Ht _0  (1.702 m)   Wt 95.3 kg   SpO2 93%   BMI 32.89 kg/m   Physical Exam Vitals and nursing note reviewed.  Constitutional:      General: She is not in acute distress.    Appearance: She is well-developed.  HENT:     Head: Normocephalic and atraumatic.  Eyes:     Conjunctiva/sclera: Conjunctivae normal.  Cardiovascular:     Rate and Rhythm: Normal rate and regular rhythm.     Heart sounds: No murmur heard. Pulmonary:     Effort: Pulmonary effort is normal. No respiratory distress.     Breath sounds: Normal breath sounds.  Abdominal:     Palpations: Abdomen is soft.     Tenderness: There is no abdominal tenderness.  Musculoskeletal:        General: No swelling.     Cervical back: Neck supple.  Skin:    General: Skin is warm and dry.     Capillary Refill: Capillary refill takes less than 2 seconds.  Neurological:     Mental Status: She is alert.     Cranial Nerves: No cranial nerve deficit.  Sensory: No sensory deficit.     Motor: No weakness.  Psychiatric:        Mood and Affect: Mood normal.     ED Results and Treatments Labs (all labs ordered are listed, but only abnormal results are displayed) Labs Reviewed - No data  to display                                                                                                                        Radiology DG Cervical Spine 1 View  Result Date: 10/27/2021 CLINICAL DATA:  evaluate for shunt malfunction EXAM: DG CERVICAL SPINE - 1 VIEW; ABDOMEN - 1 VIEW; SKULL - 1-3 VIEW; CHEST 1 VIEW COMPARISON:  None Available. FINDINGS: VP shunt catheter seen coursing along the right calvarium, inferiorly into the right neck, along the right chest wall, into the abdomen with tip in the left abdomen. No evidence of shunt discontinuity. In the lungs, low lung volumes with similar diffuse interstitial/reticulonodular opacities, similar to prior. No new consolidation, pleural effusions or pneumothorax. In the abdomen there is nonobstructive bowel gas pattern. IUD in place in the anatomic pelvis and suspected gastric tube. Right upper quadrant clips. Left pelvic/hip postoperative change. Polyarticular degenerative change. IMPRESSION: No evidence of shunt discontinuity. Electronically Signed   By: Margaretha Sheffield M.D.   On: 10/27/2021 11:12   DG Skull 1-3 Views  Result Date: 10/27/2021 CLINICAL DATA:  evaluate for shunt malfunction EXAM: DG CERVICAL SPINE - 1 VIEW; ABDOMEN - 1 VIEW; SKULL - 1-3 VIEW; CHEST 1 VIEW COMPARISON:  None Available. FINDINGS: VP shunt catheter seen coursing along the right calvarium, inferiorly into the right neck, along the right chest wall, into the abdomen with tip in the left abdomen. No evidence of shunt discontinuity. In the lungs, low lung volumes with similar diffuse interstitial/reticulonodular opacities, similar to prior. No new consolidation, pleural effusions or pneumothorax. In the abdomen there is nonobstructive bowel gas pattern. IUD in place in the anatomic pelvis and suspected gastric tube. Right upper quadrant clips. Left pelvic/hip postoperative change. Polyarticular degenerative change. IMPRESSION: No evidence of shunt discontinuity. Electronically  Signed   By: Margaretha Sheffield M.D.   On: 10/27/2021 11:12   DG Chest 1 View  Result Date: 10/27/2021 CLINICAL DATA:  evaluate for shunt malfunction EXAM: DG CERVICAL SPINE - 1 VIEW; ABDOMEN - 1 VIEW; SKULL - 1-3 VIEW; CHEST 1 VIEW COMPARISON:  None Available. FINDINGS: VP shunt catheter seen coursing along the right calvarium, inferiorly into the right neck, along the right chest wall, into the abdomen with tip in the left abdomen. No evidence of shunt discontinuity. In the lungs, low lung volumes with similar diffuse interstitial/reticulonodular opacities, similar to prior. No new consolidation, pleural effusions or pneumothorax. In the abdomen there is nonobstructive bowel gas pattern. IUD in place in the anatomic pelvis and suspected gastric tube. Right upper quadrant clips. Left pelvic/hip postoperative change. Polyarticular degenerative change. IMPRESSION: No evidence of shunt discontinuity. Electronically Signed   By: Albertina Parr  Adah Salvage M.D.   On: 10/27/2021 11:12   DG Abd 1 View  Result Date: 10/27/2021 CLINICAL DATA:  evaluate for shunt malfunction EXAM: DG CERVICAL SPINE - 1 VIEW; ABDOMEN - 1 VIEW; SKULL - 1-3 VIEW; CHEST 1 VIEW COMPARISON:  None Available. FINDINGS: VP shunt catheter seen coursing along the right calvarium, inferiorly into the right neck, along the right chest wall, into the abdomen with tip in the left abdomen. No evidence of shunt discontinuity. In the lungs, low lung volumes with similar diffuse interstitial/reticulonodular opacities, similar to prior. No new consolidation, pleural effusions or pneumothorax. In the abdomen there is nonobstructive bowel gas pattern. IUD in place in the anatomic pelvis and suspected gastric tube. Right upper quadrant clips. Left pelvic/hip postoperative change. Polyarticular degenerative change. IMPRESSION: No evidence of shunt discontinuity. Electronically Signed   By: Margaretha Sheffield M.D.   On: 10/27/2021 11:12   CT Head Wo Contrast  Result  Date: 10/27/2021 CLINICAL DATA:  Headache, chronic with new features or increased frequency. EXAM: CT HEAD WITHOUT CONTRAST TECHNIQUE: Contiguous axial images were obtained from the base of the skull through the vertex without intravenous contrast. RADIATION DOSE REDUCTION: This exam was performed according to the departmental dose-optimization program which includes automated exposure control, adjustment of the mA and/or kV according to patient size and/or use of iterative reconstruction technique. COMPARISON:  09/06/2021 FINDINGS: Brain: A right thalamic hematoma has significantly decreased in size with residual high-density from either persisting blood products or mineralization. Right frontal VP shunt with tip at the left caudothalamic groove. No ventriculomegaly. No evidence of acute infarct, acute hemorrhage, mass, or collection. Vascular: Atheromatous calcification Skull: Normal. Negative for fracture or focal lesion. Sinuses/Orbits: Bilateral cataract resection. Bilateral sphenoid sinus opacification, also seen on prior. IMPRESSION: 1. No acute finding. 2. Regression of right thalamic hematoma since 09/06/2021. 3. Normal shunted ventricular size. Electronically Signed   By: Jorje Guild M.D.   On: 10/27/2021 10:52    Pertinent labs & imaging results that were available during my care of the patient were reviewed by me and considered in my medical decision making (see MDM for details).  Medications Ordered in ED Medications  prochlorperazine (COMPAZINE) injection 10 mg (10 mg Intravenous Given 10/27/21 1139)  diphenhydrAMINE (BENADRYL) injection 25 mg (25 mg Intravenous Given 10/27/21 1140)  lactated ringers bolus 500 mL (500 mLs Intravenous New Bag/Given 10/27/21 1139)                                                                                                                                     Procedures Procedures  (including critical care time)  Medical Decision Making / ED  Course   This patient presents to the ED for concern of headache, this involves an extensive number of treatment options, and is a complaint that carries with it a high risk of complications and morbidity.  The differential diagnosis includes shunt malfunction, intracranial hemorrhage,  tension headache, sleep apnea  MDM: Patient seen emergency room for evaluation of headache.  Physical exam is unchanged from baseline with no new focal motor or sensory deficits.  XR shuntogram unremarkable and CT head is negative.  Patient given headache cocktail and while asleep, she has what appears to be sleep apnea and will intermittently drop her oxygen saturations into the mid 80s.  Upon awakening, this hypoxia resolves.  Suspect that her sleep apnea may be contributing to her frequent headaches but on reevaluation her headache has resolved after headache cocktail.  Patient would benefit from an outpatient sleep study.  With no significant hydrocephalus, I think it is unlikely that the flow rate from her VP shunt is the source of her headaches but patient may also benefit from outpatient neurosurgical follow-up.  Over drainage and low pressure headache remains on the differential.  Patient then discharged back to her facility.   Additional history obtained: -Additional history obtained from daughter -External records from outside source obtained and reviewed including: Chart review including previous notes, labs, imaging, consultation notes   Imaging Studies ordered: I ordered imaging studies including XR shuntogram, CT head I independently visualized and interpreted imaging. I agree with the radiologist interpretation   Medicines ordered and prescription drug management: Meds ordered this encounter  Medications   prochlorperazine (COMPAZINE) injection 10 mg   diphenhydrAMINE (BENADRYL) injection 25 mg   lactated ringers bolus 500 mL    -I have reviewed the patients home medicines and have made  adjustments as needed  Critical interventions none  Cardiac Monitoring: The patient was maintained on a cardiac monitor.  I personally viewed and interpreted the cardiac monitored which showed an underlying rhythm of: NSR  Social Determinants of Health:  Factors impacting patients care include: Currently lives at a facility.   Reevaluation: After the interventions noted above, I reevaluated the patient and found that they have :improved  Co morbidities that complicate the patient evaluation  Past Medical History:  Diagnosis Date   Abdominal wall hematoma 12/26/2019   Anemia    Arthritis    CHF (congestive heart failure) ([place])    pt states she has been cleared of heart failure/disease   Chronic cholecystitis with calculus    COVID-19 03/26/2019   COVID-19 virus infection 03/2019   ESRD (end stage renal disease) on dialysis [facility])    Dialysis M-W-F   Headache    "a few/wk" (03/09/2018) - no longer having these   History of blood transfusion 10/2017   "low blood count" (03/09/2018)   Hypertension    Seasonal allergies    Spinal headache    Type II diabetes mellitus ([place])       Dispostion: I considered admission for this patient, but she does not meet inpatient criteria for admission is safe for discharge and outpatient follow-up     Final Clinical Impression(s) / ED Diagnoses Final diagnoses:  Acute nonintractable headache, unspecified headache type     _0 @    Teressa Lower, MD 10/27/21 [place], [place], MD 10/27/21 [place], [place], MD 10/27/21 1529

## 2021-10-27 NOTE — ED Notes (Signed)
Attempted to call report at [facility].

## 2021-10-27 NOTE — ED Triage Notes (Signed)
Patient via EMS from [facility] with complaints of headache this morning before dialysis. She did have a complete dialysis session this morning.

## 2021-10-27 NOTE — ED Notes (Signed)
Attempted to call report x 2 to [facility].

## 2021-11-04 ENCOUNTER — Encounter: Payer: 59 | Admitting: Podiatry

## 2021-11-23 ENCOUNTER — Ambulatory Visit (INDEPENDENT_AMBULATORY_CARE_PROVIDER_SITE_OTHER): Payer: 59 | Admitting: Diagnostic Neuroimaging

## 2021-11-23 VITALS — BP 167/93 | HR 75

## 2021-11-23 DIAGNOSIS — I619 Nontraumatic intracerebral hemorrhage, unspecified: Secondary | ICD-10-CM

## 2021-11-23 NOTE — Patient Instructions (Signed)
ICH:  Right thalamic intracerebral hemorhage extending to right midbrain with IVH and mild hydrocephalus s/p EVD, likely secondary to uncontrolled hypertension Continue BP control Continue supportive care at SNF Avoid pressure points especially on left side due to left neglect

## 2021-11-23 NOTE — Progress Notes (Unsigned)
[facility]  PATIENT: Janet Mitchell DOB: 21-Aug-1965  REFERRING CLINICIAN: Elodia Florence.,* HISTORY FROM: patient and husband REASON FOR VISIT: new consult   HISTORICAL  CHIEF COMPLAINT:  Chief Complaint  Patient presents with   New Patient (Initial Visit)    Pt is stable. Husband would like to discuss the progress for his wife. Room 6 with husband.    HISTORY OF PRESENT ILLNESS:   56 year old female with end-stage renal disease, CHF, hypertension, diabetes, presented to hospital with nausea, vomiting, left-sided weakness.  Patient was taken the hospital and found to have large right thalamic intracerebral hemorrhage.  She was intubated and transferred to ICU.  Patient had external ventricular drain placed due to obstructive hydrocephalus.  Was transition to skilled nursing facility.  Since that time patient is recovering.   REVIEW OF SYSTEMS: Full 14 system review of systems performed and negative with exception of: as per HPI.  ALLERGIES: No Known Allergies  HOME MEDICATIONS: Outpatient Medications Prior to Visit  Medication Sig Dispense Refill   amantadine (SYMMETREL) 50 MG/5ML solution Take 100 mg by mouth 2 (two) times daily.     AMLODIPINE BENZOATE PO Take by mouth.     B Complex-C-Folic Acid (RENA-VITE PO) Take 1 tablet by mouth daily.     calcitRIOL (ROCALTROL) 0.5 MCG capsule Place 2 capsules (1 mcg total) into feeding tube every Monday, Wednesday, and Friday.     carvedilol (COREG) 6.25 MG tablet Take 6.25 mg by mouth 2 (two) times daily with a meal.     cetirizine (ZYRTEC) 10 MG tablet TAKE 1 TABLET BY MOUTH ONCE DAILY. 30 tablet 0   pantoprazole (PROTONIX) 20 MG tablet Take 20 mg by mouth daily.     polyethylene glycol (MIRALAX / GLYCOLAX) 17 g packet Place 17 g into feeding tube daily.     scopolamine (TRANSDERM-SCOP) 1 MG/3DAYS Place 1 patch (1.5 mg total) onto the skin every 3 (three) days. 10 patch 0   traMADol (ULTRAM) 50 MG  tablet Take 50 mg by mouth 2 (two) times daily as needed.     Accu-Chek Softclix Lancets lancets Please use it to check blood glucose twice daily and PRN. (Patient not taking: Reported on 11/23/2021) 100 each 5   acetaminophen (TYLENOL) 325 MG tablet Take 650 mg by mouth every 6 (six) hours as needed (pain). (Patient not taking: Reported on 11/23/2021)     Blood Glucose Monitoring Suppl (ACCU-CHEK GUIDE) w/Device KIT 1 Piece by Does not apply route as directed. (Patient not taking: Reported on 11/23/2021) 1 kit 0   cadexomer iodine (IODOSORB) 0.9 % gel Apply to left foot ulcer once daily. (Patient not taking: Reported on 10/27/2021) 40 g 0   glucose blood (ACCU-CHEK GUIDE) test strip Use as instructed (Patient not taking: Reported on 11/23/2021) 100 each 5   amantadine (SYMMETREL) 50 MG/5ML solution Place 20 mLs (200 mg total) into feeding tube once a week. 80 mL 0   amLODipine (NORVASC) 2.5 MG tablet Place 2 tablets (5 mg total) into feeding tube daily. 60 tablet 0   carvedilol (COREG) 6.25 MG tablet Place 1 tablet (6.25 mg total) into feeding tube 2 (two) times daily with a meal. 60 tablet 0   pantoprazole sodium (PROTONIX) 40 mg Place 40 mg into feeding tube daily. 30 each 0   No facility-administered medications prior to visit.    PAST MEDICAL HISTORY: Past Medical History:  Diagnosis Date   Abdominal wall hematoma 12/26/2019   Anemia  Arthritis    CHF (congestive heart failure) ([place])    pt states she has been cleared of heart failure/disease   Chronic cholecystitis with calculus    COVID-19 03/26/2019   COVID-19 virus infection 03/2019   ESRD (end stage renal disease) on dialysis [facility])    Dialysis M-W-F   Headache    "a few/wk" (03/09/2018) - no longer having these   History of blood transfusion 10/2017   "low blood count" (03/09/2018)   Hypertension    Seasonal allergies    Spinal headache    Type II diabetes mellitus ([place])     PAST SURGICAL HISTORY: Past Surgical History:   Procedure Laterality Date   AMPUTATION TOE Left 2013   Great toe   AV FISTULA PLACEMENT Left 07/22/2019   Procedure: LEFT ARM BASILIC ARTERIOVENOUS (AV) FISTULA CREATION;  Surgeon: Rosetta Posner, MD;  Location: [place];  Service: Vascular;  Laterality: Left;   [place] Left 10/17/2019   Procedure: LEFT SECOND STAGE [place];  Surgeon: Rosetta Posner, MD;  Location: [place];  Service: Vascular;  Laterality: Left;   BIOPSY  10/24/2018   Procedure: BIOPSY;  Surgeon: Daneil Dolin, MD;  [facility];  Service: Endoscopy;;  right and left colon   CATARACT EXTRACTION W/ INTRAOCULAR LENS IMPLANT Right    [place]; [place] N/A 03/09/2018   Procedure: LAPAROSCOPIC CHOLECYSTECTOMY WITH INTRAOPERATIVE CHOLANGIOGRAM ERAS PATHWAY;  Surgeon: Donnie Mesa, MD;  Location: [place];  Service: General;  Laterality: N/A;   COLONOSCOPY N/A 10/24/2018   Procedure: COLONOSCOPY;  Surgeon: Daneil Dolin, MD;  [facility];  Service: Endoscopy;  Laterality: N/A;  1:00pm   EYE SURGERY Left 05/15/2018   Removal of blood in the globe (due to DM)   FLEXIBLE SIGMOIDOSCOPY N/A 11/24/2017   Procedure: FLEXIBLE SIGMOIDOSCOPY;  Surgeon: Daneil Dolin, MD;  [facility];  Service: Endoscopy;  Laterality: N/A;   IR FLUORO GUIDE CV LINE RIGHT  07/17/2019   IR GASTROSTOMY TUBE MOD SED  09/16/2021   IR PARACENTESIS  07/09/2019   IR US GUIDE VASC ACCESS RIGHT  07/17/2019   LAPAROSCOPIC CHOLECYSTECTOMY  03/09/2018   LAPAROSCOPIC REVISION VENTRICULAR-PERITONEAL (V-P) SHUNT  09/08/2021   Procedure: LAPAROSCOPIC ASSISTANCE FOR  VENTRICULAR-PERITONEAL (V-P) SHUNT PLACEMENT ;  Surgeon: Vallarie Mare, MD;  Location: [facility];  Service: Neurosurgery;;   PERITONEAL CATHETER INSERTION  2017   POLYPECTOMY  10/24/2018   Procedure: POLYPECTOMY;  Surgeon: Daneil Dolin, MD;  [facility];  Service: Endoscopy;;   TOTAL HIP ARTHROPLASTY  Left 1997   VENTRICULOPERITONEAL SHUNT N/A 09/08/2021   Procedure: SHUNT INSERTION VENTRICULAR-PERITONEAL;  Surgeon: Vallarie Mare, MD;  Location: [place];  Service: Neurosurgery;  Laterality: N/A;    FAMILY HISTORY: Family History  Problem Relation Age of Onset   Heart disease Mother    Thrombocytopenia Mother        TTP   Heart failure Father    Kidney disease Paternal Grandfather    Colon cancer Neg Hx     SOCIAL HISTORY: Social History   Socioeconomic History   Marital status: Married    Spouse name: Not on file   Number of children: 2   Years of education: Not on file   Highest education level: Not on file  Occupational History   Not on file  Tobacco Use   Smoking status: Never    Passive exposure: Yes  Smokeless tobacco: Never  Vaping Use   Vaping Use: Never used  Substance and Sexual Activity   Alcohol use: Never   Drug use: Never   Sexual activity: Yes    Birth control/protection: I.U.D.    Comment: Mirena IUD  Other Topics Concern   Not on file  Social History Narrative   Not on file   Social Determinants of Health   Financial Resource Strain: Low Risk  (06/10/2020)   Overall Financial Resource Strain (CARDIA)    Difficulty of Paying Living Expenses: Not hard at all  Food Insecurity: No Food Insecurity (06/10/2020)   Hunger Vital Sign    Worried About Running Out of Food in the Last Year: Never true    [place] in the Last Year: Never true  Transportation Needs: No Transportation Needs (06/10/2020)   PRAPARE - Hydrologist (Medical): No    Lack of Transportation (Non-Medical): No  Physical Activity: Inactive (06/10/2020)   Exercise Vital Sign    Days of Exercise per Week: 0 days    Minutes of Exercise per Session: 0 min  Stress: No Stress Concern Present (06/10/2020)   [place]    Feeling of Stress : Not at all  Social Connections: [place] (06/10/2020)   Social Connection and Isolation Panel [NHANES]    Frequency of Communication with Friends and Family: More than three times a week    Frequency of Social Gatherings with Friends and Family: More than three times a week    Attends Religious Services: More than 4 times per year    Active Member of [employer] or Organizations: Yes    Attends Music therapist: More than 4 times per year    Marital Status: Married  Human resources officer Violence: Not At Risk (06/10/2020)   Humiliation, Afraid, Rape, and Kick questionnaire    Fear of Current or Ex-Partner: No    Emotionally Abused: No    Physically Abused: No    Sexually Abused: No     PHYSICAL EXAM  GENERAL EXAM/CONSTITUTIONAL: Vitals:  Vitals:   11/23/21 0951  BP: (!) 167/93  Pulse: 75   There is no height or weight on file to calculate BMI. Wt Readings from Last 3 Encounters:  10/27/21 210 lb (95.3 kg)  10/05/21 212 lb 11.9 oz (96.5 kg)  06/03/21 218 lb 6.4 oz (99.1 kg)   Patient is in no distress; well developed, nourished and groomed; neck is supple  CARDIOVASCULAR: Examination of carotid arteries is normal; no carotid bruits Regular rate and rhythm, no murmurs Examination of peripheral vascular system by observation and palpation is normal  EYES: Ophthalmoscopic exam of optic discs and posterior segments is normal; no papilledema or hemorrhages No results found.  MUSCULOSKELETAL: Gait, strength, tone, movements noted in Neurologic exam below  NEUROLOGIC: MENTAL STATUS:      No data to display         awake, alert, oriented to person DECR FLUENCY; COMPREHENSION INTACT  CRANIAL NERVE:  2nd - no papilledema on fundoscopic exam 2nd, 3rd, 4th, 6th - pupils equal and reactive to light, visual fields full to confrontation, extraocular muscles --> LATERAL GAZE INTACT; RIGHT GAZE PREFERENCE; LIMITED UP AND DOWN GAZE, no nystagmus 5th - facial sensation symmetric 7th - facial strength -->  DECR LEFT LOWER FACIAL STRENGTH 8th - hearing intact 9th - palate elevates symmetrically, uvula midline 11th - shoulder shrug symmetric 12th - tongue  protrusion midline  MOTOR:  RUE 4-5 LUE INCREASED TONE, 0/5 STRENGTH--> PRESSURE POINT ON LEFT ELBOW AGAINST WHEELCHAIR ARM REST RLE 3-4; LLE 2-3  SENSORY:  normal and symmetric to light touch; ABSENT IN LUE; LEFT NEGLECT  COORDINATION:  finger-nose-finger, fine finger movements SLOW ON RIGHT; ABSENT ON LEFT  REFLEXES:  deep tendon reflexes TRACE and symmetric  GAIT/STATION:  IN WHEELCHAIR     DIAGNOSTIC DATA (LABS, IMAGING, TESTING) - I reviewed patient records, labs, notes, testing and imaging myself where available.  Lab Results  Component Value Date   WBC 4.2 10/05/2021   HGB 10.1 (L) 10/05/2021   HCT 31.6 (L) 10/05/2021   MCV 77.8 (L) 10/05/2021   PLT 132 (L) 10/05/2021      Component Value Date/Time   NA 129 (L) 10/05/2021 0634   K 3.9 10/05/2021 0634   CL 87 (L) 10/05/2021 0634   CO2 28 10/05/2021 0634   GLUCOSE 101 (H) 10/05/2021 0634   BUN 52 (H) 10/05/2021 0634   CREATININE 5.00 (H) 10/05/2021 0634   CREATININE 5.02 (H) 08/29/2019 1506   CALCIUM 9.3 10/05/2021 0634   PROT 7.6 10/05/2021 0205   ALBUMIN 2.9 (L) 10/05/2021 0634   AST 27 10/05/2021 0205   ALT 15 10/05/2021 0205   ALKPHOS 100 10/05/2021 0205   BILITOT 0.4 10/05/2021 0205   GFRNONAA 10 (L) 10/05/2021 0634   GFRNONAA 9 (L) 08/29/2019 1506   GFRAA 11 (L) 08/29/2019 1506   Lab Results  Component Value Date   CHOL 178 08/23/2021   HDL 63 08/23/2021   LDLCALC 38 08/23/2021   TRIG 174 (H) 08/28/2021   CHOLHDL 2.8 08/23/2021   Lab Results  Component Value Date   HGBA1C 6.2 (H) 08/23/2021   Lab Results  Component Value Date   VITAMINB12 1,131 (H) 09/23/2021   Lab Results  Component Value Date   TSH 1.265 08/24/2021   HOSPITAL ADMISSION STUDIES Code Stroke CT head Right thalamic IVH previously 2.9 x 1.7 x 3.1 cm 5/29 Head CT  1000- Interval significant increase in size of a an acute hematoma in the right thalamus, now measuring 3.6 x 2.5 x 3.8 cm. Small volume intraventricular extension of hemorrhage. Mild hydrocephalus. 5/29 Head CT 2200- decrease in size of lateral and third ventricles s/p EVD placement  Repeat CT head 6/12 -1. No significant interval change in the right thalamic hemorrhage with associated mass effect and mild compression of the third  ventricle. 2. Slight interval increase in the size of the lateral ventricles compared to CT of 09/03/2021 which may represent mild hydrocephalus. CTA head & neck No arterial lesion underlying the right thalamic hematoma. Equivocal for spot sign which would be an adverse finding suggesting continued growth MRI  no change in size of the right thalamic intraparenchymal hemorrhage extending to midbrain, maximal dimension 3.6 cm by MRI. Surrounding edema, likely slightly more prominent ventricles are however similar in size to the CT of 08/23/2021. 2D Echo EF 60-65%, consistent with grade II diastolic dysfunction EEG - mild diffuse encephalopathy LDL 38 HgbA1c 6.2  10/27/21 CT head 1. No acute finding. 2. Regression of right thalamic hematoma since 09/06/2021. 3. Normal shunted ventricular size.   ASSESSMENT AND PLAN  56 y.o. year old female here with:   Dx:  1. Nontraumatic thalamic hemorrhage (HCC)       PLAN:  ICH:  Right thalamic intracerebral hemorhage extending to right midbrain with IVH and mild hydrocephalus s/p EVD, likely secondary to uncontrolled hypertension Continue BP control  Continue supportive care at SNF Avoid pressure points especially on left side due to left neglect  Return for return to PCP, pending if symptoms worsen or fail to improve.    Penni Bombard, MD 4/78/4128, 20:81 AM Certified in Neurology, Neurophysiology and Neuroimaging  [facility] [address] [place], [place] [address] [phone]

## 2021-11-24 ENCOUNTER — Encounter: Payer: Self-pay | Admitting: Diagnostic Neuroimaging

## 2021-11-30 ENCOUNTER — Ambulatory Visit (INDEPENDENT_AMBULATORY_CARE_PROVIDER_SITE_OTHER): Payer: 59

## 2021-11-30 DIAGNOSIS — Z0000 Encounter for general adult medical examination without abnormal findings: Secondary | ICD-10-CM | POA: Diagnosis not present

## 2021-11-30 NOTE — Patient Instructions (Signed)
[name] , Thank you for taking time to come for your Medicare Wellness Visit. I appreciate your ongoing commitment to your health goals. Please review the following plan we discussed and let me know if I can assist you in the future.   Screening recommendations/referrals: Colonoscopy: Completed Mammogram: Completed Recommended yearly ophthalmology/optometry visit for glaucoma screening and checkup Recommended yearly dental visit for hygiene and checkup  Vaccinations: Influenza vaccine: Due Tdap vaccine: Completed Shingles vaccine: Due    Advanced directives: Requested copy to be brought at next appt  Conditions/risks identified: Falls, hypertension, diabetes  Next appointment: 1 year  Preventive Care 40-64 Years, Female Preventive care refers to lifestyle choices and visits with your health care provider that can promote health and wellness. What does preventive care include? A yearly physical exam. This is also called an annual well check. Dental exams once or twice a year. Routine eye exams. Ask your health care provider how often you should have your eyes checked. Personal lifestyle choices, including: Daily care of your teeth and gums. Regular physical activity. Eating a healthy diet. Avoiding tobacco and drug use. Limiting alcohol use. Practicing safe sex. Taking low-dose aspirin daily starting at age 37. Taking vitamin and mineral supplements as recommended by your health care provider. What happens during an annual well check? The services and screenings done by your health care provider during your annual well check will depend on your age, overall health, lifestyle risk factors, and family history of disease. Counseling  Your health care provider may ask you questions about your: Alcohol use. Tobacco use. Drug use. Emotional well-being. Home and relationship well-being. Sexual activity. Eating habits. Work and work Statistician. Method of birth  control. Menstrual cycle. Pregnancy history. Screening  You may have the following tests or measurements: Height, weight, and BMI. Blood pressure. Lipid and cholesterol levels. These may be checked every 5 years, or more frequently if you are over 76 years old. Skin check. Lung cancer screening. You may have this screening every year starting at age 38 if you have a 30-pack-year history of smoking and currently smoke or have quit within the past 15 years. Fecal occult blood test (FOBT) of the stool. You may have this test every year starting at age 59. Flexible sigmoidoscopy or colonoscopy. You may have a sigmoidoscopy every 5 years or a colonoscopy every 10 years starting at age 88. Hepatitis C blood test. Hepatitis B blood test. Sexually transmitted disease (STD) testing. Diabetes screening. This is done by checking your blood sugar (glucose) after you have not eaten for a while (fasting). You may have this done every 1-3 years. Mammogram. This may be done every 1-2 years. Talk to your health care provider about when you should start having regular mammograms. This may depend on whether you have a family history of breast cancer. BRCA-related cancer screening. This may be done if you have a family history of breast, ovarian, tubal, or peritoneal cancers. Pelvic exam and Pap test. This may be done every 3 years starting at age 59. Starting at age 3, this may be done every 5 years if you have a Pap test in combination with an HPV test. Bone density scan. This is done to screen for osteoporosis. You may have this scan if you are at high risk for osteoporosis. Discuss your test results, treatment options, and if necessary, the need for more tests with your health care provider. Vaccines  Your health care provider may recommend certain vaccines, such as: Influenza vaccine.  This is recommended every year. Tetanus, diphtheria, and acellular pertussis (Tdap, Td) vaccine. You may need a Td booster  every 10 years. Zoster vaccine. You may need this after age 60. Pneumococcal 13-valent conjugate (PCV13) vaccine. You may need this if you have certain conditions and were not previously vaccinated. Pneumococcal polysaccharide (PPSV23) vaccine. You may need one or two doses if you smoke cigarettes or if you have certain conditions. Talk to your health care provider about which screenings and vaccines you need and how often you need them. This information is not intended to replace advice given to you by your health care provider. Make sure you discuss any questions you have with your health care provider. Document Released: 04/10/2015 Document Revised: 12/02/2015 Document Reviewed: 01/13/2015 Elsevier Interactive Patient Education  2017 [place] Prevention in the Home Falls can cause injuries. They can happen to people of all ages. There are many things you can do to make your home safe and to help prevent falls. What can I do on the outside of my home? Regularly fix the edges of walkways and driveways and fix any cracks. Remove anything that might make you trip as you walk through a door, such as a raised step or threshold. Trim any bushes or trees on the path to your home. Use bright outdoor lighting. Clear any walking paths of anything that might make someone trip, such as rocks or tools. Regularly check to see if handrails are loose or broken. Make sure that both sides of any steps have handrails. Any raised decks and porches should have guardrails on the edges. Have any leaves, snow, or ice cleared regularly. Use sand or salt on walking paths during winter. Clean up any spills in your garage right away. This includes oil or grease spills. What can I do in the bathroom? Use night lights. Install grab bars by the toilet and in the tub and shower. Do not use towel bars as grab bars. Use non-skid mats or decals in the tub or shower. If you need to sit down in the shower,  use a plastic, non-slip stool. Keep the floor dry. Clean up any water that spills on the floor as soon as it happens. Remove soap buildup in the tub or shower regularly. Attach bath mats securely with double-sided non-slip rug tape. Do not have throw rugs and other things on the floor that can make you trip. What can I do in the bedroom? Use night lights. Make sure that you have a light by your bed that is easy to reach. Do not use any sheets or blankets that are too big for your bed. They should not hang down onto the floor. Have a firm chair that has side arms. You can use this for support while you get dressed. Do not have throw rugs and other things on the floor that can make you trip. What can I do in the kitchen? Clean up any spills right away. Avoid walking on wet floors. Keep items that you use a lot in easy-to-reach places. If you need to reach something above you, use a strong step stool that has a grab bar. Keep electrical cords out of the way. Do not use floor polish or wax that makes floors slippery. If you must use wax, use non-skid floor wax. Do not have throw rugs and other things on the floor that can make you trip. What can I do with my stairs? Do not leave any items on  the stairs. Make sure that there are handrails on both sides of the stairs and use them. Fix handrails that are broken or loose. Make sure that handrails are as long as the stairways. Check any carpeting to make sure that it is firmly attached to the stairs. Fix any carpet that is loose or worn. Avoid having throw rugs at the top or bottom of the stairs. If you do have throw rugs, attach them to the floor with carpet tape. Make sure that you have a light switch at the top of the stairs and the bottom of the stairs. If you do not have them, ask someone to add them for you. What else can I do to help prevent falls? Wear shoes that: Do not have high heels. Have rubber bottoms. Are comfortable and fit you  well. Are closed at the toe. Do not wear sandals. If you use a stepladder: Make sure that it is fully opened. Do not climb a closed stepladder. Make sure that both sides of the stepladder are locked into place. Ask someone to hold it for you, if possible. Clearly mark and make sure that you can see: Any grab bars or handrails. First and last steps. Where the edge of each step is. Use tools that help you move around (mobility aids) if they are needed. These include: Canes. Walkers. Scooters. Crutches. Turn on the lights when you go into a dark area. Replace any light bulbs as soon as they burn out. Set up your furniture so you have a clear path. Avoid moving your furniture around. If any of your floors are uneven, fix them. If there are any pets around you, be aware of where they are. Review your medicines with your doctor. Some medicines can make you feel dizzy. This can increase your chance of falling. Ask your doctor what other things that you can do to help prevent falls. This information is not intended to replace advice given to you by your health care provider. Make sure you discuss any questions you have with your health care provider. Document Released: 01/08/2009 Document Revised: 08/20/2015 Document Reviewed: 04/18/2014 Elsevier Interactive Patient Education  2017 [employer].

## 2021-11-30 NOTE — Progress Notes (Signed)
Subjective:   Janet Mitchell is a 56 y.o. female who presents for Medicare Annual (Subsequent) preventive examination. I connected with  Janet Mitchell on 11/30/21 by a audio enabled telemedicine application and verified that I am speaking with the correct person using two identifiers.  Patient Location: [place]  Provider Location: Office/Clinic  I discussed the limitations of evaluation and management by telemedicine. The patient expressed understanding and agreed to proceed.  Review of Systems     Janet Mitchell , Thank you for taking time to come for your Medicare Wellness Visit. I appreciate your ongoing commitment to your health goals. Please review the following plan we discussed and let me know if I can assist you in the future.   These are the goals we discussed:  Goals   None     This is a list of the screening recommended for you and due dates:  Health Maintenance  Topic Date Due   Urine Protein Check  Never done   Zoster (Shingles) Vaccine (1 of 2) Never done   COVID-19 Vaccine (6 - Mixed Product risk series) 10/24/2019   Pap Smear  05/08/2021   Complete foot exam   06/09/2021   Flu Shot  10/26/2021   Hemoglobin A1C  02/23/2022   Eye exam for diabetics  06/02/2022   Mammogram  08/14/2022   Colon Cancer Screening  10/23/2028   Tetanus Vaccine  04/07/2029   Hepatitis C Screening: USPSTF Recommendation to screen - Ages 18-79 yo.  Completed   HIV Screening  Completed   HPV Vaccine  Aged Out          Objective:    There were no vitals filed for this visit. There is no height or weight on file to calculate BMI.     10/27/2021   10:12 AM 06/10/2020    2:06 PM 10/17/2019    6:56 AM 07/29/2019    8:55 PM 07/27/2019    6:00 AM 07/26/2019   12:57 PM 07/22/2019    8:21 AM  Advanced Directives  Does Patient Have a Medical Advance Directive? No No No No  No No  Would patient like information on creating a medical advance directive?  No -  Patient declined No - Patient declined No - Patient declined No - Patient declined  No - Patient declined    Current Medications (verified) Outpatient Encounter Medications as of 11/30/2021  Medication Sig   Accu-Chek Softclix Lancets lancets Please use it to check blood glucose twice daily and PRN. (Patient not taking: Reported on 11/23/2021)   acetaminophen (TYLENOL) 325 MG tablet Take 650 mg by mouth every 6 (six) hours as needed (pain). (Patient not taking: Reported on 11/23/2021)   amantadine (SYMMETREL) 50 MG/5ML solution Take 100 mg by mouth 2 (two) times daily.   AMLODIPINE BENZOATE PO Take by mouth.   B Complex-C-Folic Acid (RENA-VITE PO) Take 1 tablet by mouth daily.   Blood Glucose Monitoring Suppl (ACCU-CHEK GUIDE) w/Device KIT 1 Piece by Does not apply route as directed. (Patient not taking: Reported on 11/23/2021)   cadexomer iodine (IODOSORB) 0.9 % gel Apply to left foot ulcer once daily. (Patient not taking: Reported on 10/27/2021)   calcitRIOL (ROCALTROL) 0.5 MCG capsule Place 2 capsules (1 mcg total) into feeding tube every Monday, Wednesday, and Friday.   carvedilol (COREG) 6.25 MG tablet Take 6.25 mg by mouth 2 (two) times daily with a meal.   cetirizine (ZYRTEC) 10 MG tablet TAKE 1 TABLET BY  MOUTH ONCE DAILY.   glucose blood (ACCU-CHEK GUIDE) test strip Use as instructed (Patient not taking: Reported on 11/23/2021)   pantoprazole (PROTONIX) 20 MG tablet Take 20 mg by mouth daily.   polyethylene glycol (MIRALAX / GLYCOLAX) 17 g packet Place 17 g into feeding tube daily.   scopolamine (TRANSDERM-SCOP) 1 MG/3DAYS Place 1 patch (1.5 mg total) onto the skin every 3 (three) days.   traMADol (ULTRAM) 50 MG tablet Take 50 mg by mouth 2 (two) times daily as needed.   No facility-administered encounter medications on file as of 11/30/2021.    Allergies (verified) Patient has no known allergies.   History: Past Medical History:  Diagnosis Date   Abdominal wall hematoma 12/26/2019    Anemia    Arthritis    CHF (congestive heart failure) ([place])    pt states she has been cleared of heart failure/disease   Chronic cholecystitis with calculus    COVID-19 03/26/2019   COVID-19 virus infection 03/2019   ESRD (end stage renal disease) on dialysis [facility])    Dialysis M-W-F   Headache    "a few/wk" (03/09/2018) - no longer having these   History of blood transfusion 10/2017   "low blood count" (03/09/2018)   Hypertension    Seasonal allergies    Spinal headache    Type II diabetes mellitus ([place])    Past Surgical History:  Procedure Laterality Date   AMPUTATION TOE Left 2013   Great toe   AV FISTULA PLACEMENT Left 07/22/2019   Procedure: LEFT ARM BASILIC ARTERIOVENOUS (AV) FISTULA CREATION;  Surgeon: Rosetta Posner, MD;  Location: [place];  Service: Vascular;  Laterality: Left;   [place] Left 10/17/2019   Procedure: LEFT SECOND STAGE [place];  Surgeon: Rosetta Posner, MD;  Location: [place];  Service: Vascular;  Laterality: Left;   BIOPSY  10/24/2018   Procedure: BIOPSY;  Surgeon: Daneil Dolin, MD;  [facility];  Service: Endoscopy;;  right and left colon   CATARACT EXTRACTION W/ INTRAOCULAR LENS IMPLANT Right    [place]; [place] N/A 03/09/2018   Procedure: LAPAROSCOPIC CHOLECYSTECTOMY WITH INTRAOPERATIVE CHOLANGIOGRAM ERAS PATHWAY;  Surgeon: Donnie Mesa, MD;  Location: [place];  Service: General;  Laterality: N/A;   COLONOSCOPY N/A 10/24/2018   Procedure: COLONOSCOPY;  Surgeon: Daneil Dolin, MD;  [facility];  Service: Endoscopy;  Laterality: N/A;  1:00pm   EYE SURGERY Left 05/15/2018   Removal of blood in the globe (due to DM)   FLEXIBLE SIGMOIDOSCOPY N/A 11/24/2017   Procedure: FLEXIBLE SIGMOIDOSCOPY;  Surgeon: Daneil Dolin, MD;  [facility];  Service: Endoscopy;  Laterality: N/A;   IR FLUORO GUIDE CV LINE RIGHT  07/17/2019   IR GASTROSTOMY TUBE MOD SED  09/16/2021    IR PARACENTESIS  07/09/2019   IR US GUIDE VASC ACCESS RIGHT  07/17/2019   LAPAROSCOPIC CHOLECYSTECTOMY  03/09/2018   LAPAROSCOPIC REVISION VENTRICULAR-PERITONEAL (V-P) SHUNT  09/08/2021   Procedure: LAPAROSCOPIC ASSISTANCE FOR  VENTRICULAR-PERITONEAL (V-P) SHUNT PLACEMENT ;  Surgeon: Vallarie Mare, MD;  Location: [facility] OR;  Service: Neurosurgery;;   PERITONEAL CATHETER INSERTION  2017   POLYPECTOMY  10/24/2018   Procedure: POLYPECTOMY;  Surgeon: Daneil Dolin, MD;  [facility];  Service: Endoscopy;;   TOTAL HIP ARTHROPLASTY Left 1997   VENTRICULOPERITONEAL SHUNT N/A 09/08/2021   Procedure: SHUNT INSERTION VENTRICULAR-PERITONEAL;  Surgeon: Vallarie Mare, MD;  Location: [place];  Service: Neurosurgery;  Laterality: N/A;   Family History  Problem Relation Age of Onset   Heart disease Mother    Thrombocytopenia Mother        TTP   Heart failure Father    Kidney disease Paternal Grandfather    Colon cancer Neg Hx    Social History   Socioeconomic History   Marital status: Married    Spouse name: Not on file   Number of children: 2   Years of education: Not on file   Highest education level: Not on file  Occupational History   Not on file  Tobacco Use   Smoking status: Never    Passive exposure: Yes   Smokeless tobacco: Never  Vaping Use   Vaping Use: Never used  Substance and Sexual Activity   Alcohol use: Never   Drug use: Never   Sexual activity: Yes    Birth control/protection: I.U.D.    Comment: Mirena IUD  Other Topics Concern   Not on file  Social History Narrative   Not on file   Social Determinants of Health   Financial Resource Strain: Low Risk  (06/10/2020)   Overall Financial Resource Strain (CARDIA)    Difficulty of Paying Living Expenses: Not hard at all  Food Insecurity: No Food Insecurity (06/10/2020)   Hunger Vital Sign    Worried About Running Out of Food in the Last Year: Never true    [place] in the Last Year: Never true   Transportation Needs: No Transportation Needs (06/10/2020)   PRAPARE - Hydrologist (Medical): No    Lack of Transportation (Non-Medical): No  Physical Activity: Inactive (06/10/2020)   Exercise Vital Sign    Days of Exercise per Week: 0 days    Minutes of Exercise per Session: 0 min  Stress: No Stress Concern Present (06/10/2020)   [place] of Stress : Not at all  Social Connections: [place] (06/10/2020)   Social Connection and Isolation Panel [NHANES]    Frequency of Communication with Friends and Family: More than three times a week    Frequency of Social Gatherings with Friends and Family: More than three times a week    Attends Religious Services: More than 4 times per year    Active Member of [employer] or Organizations: Yes    Attends Music therapist: More than 4 times per year    Marital Status: Married    Tobacco Counseling Counseling given: Not Answered   Clinical Intake:                 Diabetic? Yes  Nutrition Risk Assessment:  Has the patient had any N/V/D within the last 2 months?  No  Does the patient have any non-healing wounds?  No  Has the patient had any unintentional weight loss or weight gain?  No   Diabetes:  Is the patient diabetic?  Yes  If diabetic, was a CBG obtained today?  No  Did the patient bring in their glucometer from home?  No  How often do you monitor your CBG's? Twice daily.   Financial Strains and Diabetes Management:  Are you having any financial strains with the device, your supplies or your medication? No .  Does the patient want to be seen by Chronic Care Management for management of their diabetes?  No  Would the patient like to be referred to a  Nutritionist or for Diabetic Management?  No   Diabetic Exams:  Diabetic Eye Exam: Completed 06/01/2021.   Diabetic Foot Exam: Completed 06/09/2020.  Pt has been advised about the importance in completing this exam.         Activities of Daily Living     No data to display           Patient Care Team: Lindell Spar, MD as PCP - General (Internal Medicine) Gala Romney Cristopher Estimable, MD as Consulting Physician (Gastroenterology) Dialysis, Theressa Stamps  Indicate any recent Medical Services you may have received from other than [facility] providers in the past year (date may be approximate).     Assessment:   This is a routine wellness examination for [facility].  Hearing/Vision screen No results found.  Dietary issues and exercise activities discussed:     Goals Addressed   None   Depression Screen    06/03/2021   11:41 AM 11/10/2020    2:39 PM 09/23/2020    8:29 AM 08/04/2020    2:56 PM 07/14/2020    4:20 PM 06/25/2020    1:06 PM 06/10/2020    1:57 PM  PHQ 2/9 Scores  PHQ - 2 Score 0 0 0 0 0 0 0    Fall Risk    06/03/2021   11:40 AM 11/10/2020    2:39 PM 09/23/2020    8:29 AM 08/04/2020    2:56 PM 07/14/2020    4:19 PM  Fall Risk   Falls in the past year? 0 0 0 0 0  Number falls in past yr: 0 0 0 0 0  Injury with Fall? 0 0 0 0 0  Risk for fall due to : _0   Follow up _1     FALL RISK PREVENTION PERTAINING TO THE HOME:  Any stairs in or around the home? Yes  If so, are there any without handrails? No  Home free of loose throw rugs in walkways, pet beds, electrical cords, etc? Yes  Adequate lighting in your home to reduce risk of falls? Yes   ASSISTIVE DEVICES UTILIZED TO PREVENT FALLS:  Life alert? No  Use of a cane, walker or w/c? Yes  Grab bars in the bathroom? No  Shower chair or bench in shower? Yes  Elevated toilet seat or a handicapped toilet? No    Cognitive Function:        06/10/2020    1:59 PM  6CIT Screen   What Year? 0 points  What month? 0 points  What time? 0 points  Count back from 20 0 points  Months in reverse 0 points  Repeat phrase 0 points  Total Score 0 points    Immunizations Immunization History  Administered Date(s) Administered   Hepatitis B, ped/adol 11/16/2015, 12/11/2015, 01/08/2016, 02/26/2016   Influenza Split 05/27/2015, 12/11/2015, 01/04/2016, 01/27/2017, 01/15/2018, 01/03/2019   Influenza,inj,Quad PF,6-35 Mos 12/27/2019   Influenza-Unspecified 12/26/2019, 01/22/2021   Moderna Sars-Covid-2 Vaccination 06/08/2019, 08/09/2019, 08/29/2019   PPD Test 05/03/2016, 05/05/2017, 05/14/2018, 01/10/2020, 02/15/2021   Pneumococcal Conjugate-13 05/27/2015, 04/06/2018   Tdap 04/08/2019   Unspecified SARS-COV-2 Vaccination 06/14/2019, 08/09/2019    TDAP status: Up to date  Flu Vaccine status: Due, Education has been provided regarding the importance of this vaccine. Advised may receive this vaccine at local pharmacy or Health Dept. Aware to provide a copy of  the vaccination record if obtained from local pharmacy or Health Dept. Verbalized acceptance and understanding.  Covid-19 vaccine status: Completed vaccines  Qualifies for Shingles Vaccine? No     Screening Tests Health Maintenance  Topic Date Due   URINE MICROALBUMIN  Never done   Zoster Vaccines- Shingrix (1 of 2) Never done   COVID-19 Vaccine (6 - Mixed Product risk series) 10/24/2019   PAP SMEAR-Modifier  05/08/2021   FOOT EXAM  06/09/2021   INFLUENZA VACCINE  10/26/2021   HEMOGLOBIN A1C  02/23/2022   OPHTHALMOLOGY EXAM  06/02/2022   MAMMOGRAM  08/14/2022   COLONOSCOPY (Pts 45-26yr Insurance coverage will need to be confirmed)  10/23/2028   TETANUS/TDAP  04/07/2029   Hepatitis C Screening  Completed   HIV Screening  Completed   HPV VACCINES  Aged Out    Health Maintenance  Health Maintenance Due  Topic Date Due   URINE MICROALBUMIN  Never done   Zoster Vaccines- Shingrix (1 of 2) Never done    COVID-19 Vaccine (6 - Mixed Product risk series) 10/24/2019   PAP SMEAR-Modifier  05/08/2021   FOOT EXAM  06/09/2021   INFLUENZA VACCINE  10/26/2021    Colorectal cancer screening: Type of screening: Colonoscopy. Completed 10/24/2018. Repeat every 10 years  Mammogram status: Completed 08/13/2020. Repeat every year    Lung Cancer Screening: (Low Dose CT Chest recommended if Age 54-80years, 30 pack-year currently smoking OR have quit w/in 15years.) does not qualify.    Additional Screening:  Hepatitis C Screening: does qualify; Completed 09/05/2021  Vision Screening: Recommended annual ophthalmology exams for early detection of glaucoma and other disorders of the eye. Is the patient up to date with their annual eye exam?  Yes  Who is the provider or what is the name of the office in which the patient attends annual eye exams? [facility]   Dental Screening: Recommended annual dental exams for proper oral hygiene  Community Resource Referral / Chronic Care Management: CRR required this visit?  No   CCM required this visit?  No      Plan:     I have personally reviewed and noted the following in the patient's chart:   Medical and social history Use of alcohol, tobacco or illicit drugs  Current medications and supplements including opioid prescriptions. Patient is currently taking opioid prescriptions. Information provided to patient regarding non-opioid alternatives. Patient advised to discuss non-opioid treatment plan with their provider. Functional ability and status Nutritional status Physical activity Advanced directives List of other physicians Hospitalizations, surgeries, and ER visits in previous 12 months Vitals Screenings to include cognitive, depression, and falls Referrals and appointments  In addition, I have reviewed and discussed with patient certain preventive protocols, quality metrics, and best practice recommendations. A written personalized care plan  for preventive services as well as general preventive health recommendations were provided to patient.     KJohny Drilling [place]sworth  11/30/2021   Nurse Notes:  Ms. HDoyle Tegethoff, Thank you for taking time to come for your Medicare Wellness Visit. I appreciate your ongoing commitment to your health goals. Please review the following plan we discussed and let me know if I can assist you in the future.   These are the goals we discussed:  Goals   None     This is a list of the screening recommended for you and due dates:  Health Maintenance  Topic Date Due   Urine Protein Check  Never done   Zoster (  Shingles) Vaccine (1 of 2) Never done   COVID-19 Vaccine (6 - Mixed Product risk series) 10/24/2019   Pap Smear  05/08/2021   Complete foot exam   06/09/2021   Flu Shot  10/26/2021   Hemoglobin A1C  02/23/2022   Eye exam for diabetics  06/02/2022   Mammogram  08/14/2022   Colon Cancer Screening  10/23/2028   Tetanus Vaccine  04/07/2029   Hepatitis C Screening: USPSTF Recommendation to screen - Ages 18-79 yo.  Completed   HIV Screening  Completed   HPV Vaccine  Aged Out

## 2022-01-25 ENCOUNTER — Telehealth: Payer: Self-pay | Admitting: Internal Medicine

## 2022-01-25 NOTE — Telephone Encounter (Signed)
Janet Mitchell, [place] called stating that she went out to see pt for the initial PT eval start of care & pt vitals were BP189/103, HR 63, O2 93% and temp 97.3. states pt has taken her BP medication right before she came to eval. States when eval was finished her vitals were 4/10 on pain scale, BP 173/96, HR 68, O2 95%. States pt husband has noticed gum bleeding & thinks this is coming from her BP medication. States pt also had some signs of skin breakdown starting to gluteus area.    She states she will be faxing over orders for care but wanted to report from today's visit.

## 2022-01-26 ENCOUNTER — Ambulatory Visit (INDEPENDENT_AMBULATORY_CARE_PROVIDER_SITE_OTHER): Payer: 59 | Admitting: Internal Medicine

## 2022-01-26 ENCOUNTER — Encounter: Payer: Self-pay | Admitting: Internal Medicine

## 2022-01-26 VITALS — BP 132/86 | HR 75 | Resp 18 | Ht 68.0 in

## 2022-01-26 DIAGNOSIS — E1122 Type 2 diabetes mellitus with diabetic chronic kidney disease: Secondary | ICD-10-CM | POA: Diagnosis not present

## 2022-01-26 DIAGNOSIS — Z931 Gastrostomy status: Secondary | ICD-10-CM

## 2022-01-26 DIAGNOSIS — N185 Chronic kidney disease, stage 5: Secondary | ICD-10-CM

## 2022-01-26 DIAGNOSIS — I10 Essential (primary) hypertension: Secondary | ICD-10-CM | POA: Diagnosis not present

## 2022-01-26 DIAGNOSIS — N186 End stage renal disease: Secondary | ICD-10-CM | POA: Diagnosis not present

## 2022-01-26 DIAGNOSIS — Z794 Long term (current) use of insulin: Secondary | ICD-10-CM

## 2022-01-26 DIAGNOSIS — Z992 Dependence on renal dialysis: Secondary | ICD-10-CM

## 2022-01-26 DIAGNOSIS — G8194 Hemiplegia, unspecified affecting left nondominant side: Secondary | ICD-10-CM

## 2022-01-26 DIAGNOSIS — I629 Nontraumatic intracranial hemorrhage, unspecified: Secondary | ICD-10-CM

## 2022-01-26 HISTORY — DX: Gastrostomy status: Z93.1

## 2022-01-26 NOTE — Patient Instructions (Addendum)
Please continue to follow renal diet.  Please contact Neurosurgeon about Amantadine.  Please continue to take other medications as prescribed.

## 2022-01-27 ENCOUNTER — Encounter: Payer: Self-pay | Admitting: Internal Medicine

## 2022-01-27 DIAGNOSIS — G8194 Hemiplegia, unspecified affecting left nondominant side: Secondary | ICD-10-CM | POA: Insufficient documentation

## 2022-01-27 NOTE — Assessment & Plan Note (Signed)
Followed by Nephrology On HD MWF 

## 2022-01-27 NOTE — Assessment & Plan Note (Addendum)
Had right sided thalamic hemorrhage S/p external ventricular shunt placement On amantadine Needs follow-up with neurosurgery

## 2022-01-27 NOTE — Assessment & Plan Note (Addendum)
Due to right-sided thalamic hemorrhage Currently wheelchair-bound Referred to home health for PT and home safety evaluation 

## 2022-01-27 NOTE — Assessment & Plan Note (Signed)
BP Readings from Last 1 Encounters:  01/26/22 132/86   Well-controlled with amlodipine and carvedilol Counseled for compliance with the medications Advised renal diet

## 2022-01-27 NOTE — Progress Notes (Signed)
Established Patient Office Visit  Subjective:  Patient ID: Janet Mitchell, female    DOB: 1965-10-03  Age: 56 y.o. MRN: [id number]  CC:  Chief Complaint  Patient presents with   Follow-up    Follow up     HPI Janet Mitchell is a 56 y.o. female with past medical history of HTN, DM with ESRD on HD and intracranial hemorrhage who presents for f/u of her chronic medical conditions.  She had intracranial hemorrhage in 05/23.  She was admitted at [facility] for right-sided thalamic hemorrhage, for which she had external ventricular shunt placement by neurosurgery.  She had trach and PEG tube placed, and was discharged to SNF on 10/05/21.  She was recently discharged to home in the last week.  She still has a PEG tube, but has been tolerating p.o. intake currently.  She is currently taking amantadine prescribed by neurosurgery.  She has residual left-sided hemiplegia and is wheelchair-bound currently.  She is planned to get home PT.  She is currently on HD for ESRD.  Her BP is well controlled currently.  Her blood glucose has been well controlled without needing insulin.  Past Medical History:  Diagnosis Date   Abdominal wall hematoma 12/26/2019   Anemia    Arthritis    CHF (congestive heart failure) ([place])    pt states she has been cleared of heart failure/disease   Chronic cholecystitis with calculus    COVID-19 03/26/2019   COVID-19 virus infection 03/2019   ESRD (end stage renal disease) on dialysis [facility])    Dialysis M-W-F   Headache    "a few/wk" (03/09/2018) - no longer having these   History of blood transfusion 10/2017   "low blood count" (03/09/2018)   Hypertension    Respiratory failure requiring intubation (HCC)    Seasonal allergies    Spinal headache    Type II diabetes mellitus ([place])     Past Surgical History:  Procedure Laterality Date   AMPUTATION TOE Left 2013   Great toe   AV FISTULA PLACEMENT Left 07/22/2019   Procedure: LEFT ARM BASILIC  ARTERIOVENOUS (AV) FISTULA CREATION;  Surgeon: Rosetta Posner, MD;  Location: [place];  Service: Vascular;  Laterality: Left;   [place] Left 10/17/2019   Procedure: LEFT SECOND STAGE [place];  Surgeon: Rosetta Posner, MD;  Location: [place];  Service: Vascular;  Laterality: Left;   BIOPSY  10/24/2018   Procedure: BIOPSY;  Surgeon: Daneil Dolin, MD;  [facility];  Service: Endoscopy;;  right and left colon   CATARACT EXTRACTION W/ INTRAOCULAR LENS IMPLANT Right    [place]; [place] N/A 03/09/2018   Procedure: LAPAROSCOPIC CHOLECYSTECTOMY WITH INTRAOPERATIVE CHOLANGIOGRAM ERAS PATHWAY;  Surgeon: Donnie Mesa, MD;  Location: [place];  Service: General;  Laterality: N/A;   COLONOSCOPY N/A 10/24/2018   Procedure: COLONOSCOPY;  Surgeon: Daneil Dolin, MD;  [facility];  Service: Endoscopy;  Laterality: N/A;  1:00pm   EYE SURGERY Left 05/15/2018   Removal of blood in the globe (due to DM)   FLEXIBLE SIGMOIDOSCOPY N/A 11/24/2017   Procedure: FLEXIBLE SIGMOIDOSCOPY;  Surgeon: Daneil Dolin, MD;  [facility];  Service: Endoscopy;  Laterality: N/A;   IR FLUORO GUIDE CV LINE RIGHT  07/17/2019   IR GASTROSTOMY TUBE MOD SED  09/16/2021   IR PARACENTESIS  07/09/2019   IR US GUIDE VASC ACCESS RIGHT  07/17/2019  LAPAROSCOPIC CHOLECYSTECTOMY  03/09/2018   LAPAROSCOPIC REVISION VENTRICULAR-PERITONEAL (V-P) SHUNT  09/08/2021   Procedure: LAPAROSCOPIC ASSISTANCE FOR  VENTRICULAR-PERITONEAL (V-P) SHUNT PLACEMENT ;  Surgeon: Vallarie Mare, MD;  Location: [facility] OR;  Service: Neurosurgery;;   PERITONEAL CATHETER INSERTION  2017   POLYPECTOMY  10/24/2018   Procedure: POLYPECTOMY;  Surgeon: Daneil Dolin, MD;  [facility];  Service: Endoscopy;;   TOTAL HIP ARTHROPLASTY Left 1997   VENTRICULOPERITONEAL SHUNT N/A 09/08/2021   Procedure: SHUNT INSERTION VENTRICULAR-PERITONEAL;  Surgeon: Vallarie Mare,  MD;  Location: [place];  Service: Neurosurgery;  Laterality: N/A;    Family History  Problem Relation Age of Onset   Heart disease Mother    Thrombocytopenia Mother        TTP   Heart failure Father    Kidney disease Paternal Grandfather    Colon cancer Neg Hx     Social History   Socioeconomic History   Marital status: Married    Spouse name: Not on file   Number of children: 2   Years of education: Not on file   Highest education level: Not on file  Occupational History   Not on file  Tobacco Use   Smoking status: Never    Passive exposure: Yes   Smokeless tobacco: Never  Vaping Use   Vaping Use: Never used  Substance and Sexual Activity   Alcohol use: Never   Drug use: Never   Sexual activity: Yes    Birth control/protection: I.U.D.    Comment: Mirena IUD  Other Topics Concern   Not on file  Social History Narrative   Not on file   Social Determinants of Health   Financial Resource Strain: Low Risk  (11/30/2021)   Overall Financial Resource Strain (CARDIA)    Difficulty of Paying Living Expenses: Not hard at all  Food Insecurity: No Food Insecurity (11/30/2021)   Hunger Vital Sign    Worried About Running Out of Food in the Last Year: Never true    Ran Out of Food in the Last Year: Never true  Transportation Needs: No Transportation Needs (11/30/2021)   PRAPARE - Hydrologist (Medical): No    Lack of Transportation (Non-Medical): No  Physical Activity: Sufficiently Active (11/30/2021)   Exercise Vital Sign    Days of Exercise per Week: 5 days    Minutes of Exercise per Session: 30 min  Stress: No Stress Concern Present (11/30/2021)   [place]    Feeling of Stress : Not at all  Social Connections: [employer] (11/30/2021)   Social Connection and Isolation Panel [NHANES]    Frequency of Communication with Friends and Family: More than three times a week     Frequency of Social Gatherings with Friends and Family: More than three times a week    Attends Religious Services: More than 4 times per year    Active Member of [employer] or Organizations: Yes    Attends Music therapist: More than 4 times per year    Marital Status: Married  Human resources officer Violence: Not At Risk (11/30/2021)   Humiliation, Afraid, Rape, and Kick questionnaire    Fear of Current or Ex-Partner: No    Emotionally Abused: No    Physically Abused: No    Sexually Abused: No    Outpatient Medications Prior to Visit  Medication Sig Dispense Refill   amantadine (SYMMETREL) 50 MG/5ML solution Take 100 mg  by mouth 2 (two) times daily.     AMLODIPINE BENZOATE PO Take 2.5 mg by mouth daily.     B Complex-C-Folic Acid (RENA-VITE PO) Take 1 tablet by mouth daily.     calcitRIOL (ROCALTROL) 0.5 MCG capsule Place 2 capsules (1 mcg total) into feeding tube every Monday, Wednesday, and Friday.     carvedilol (COREG) 6.25 MG tablet Take 6.25 mg by mouth 2 (two) times daily with a meal.     cetirizine (ZYRTEC) 10 MG tablet TAKE 1 TABLET BY MOUTH ONCE DAILY. 30 tablet 0   pantoprazole (PROTONIX) 20 MG tablet Take 20 mg by mouth daily.     traMADol (ULTRAM) 50 MG tablet Take 50 mg by mouth 2 (two) times daily as needed.     scopolamine (TRANSDERM-SCOP) 1 MG/3DAYS Place 1 patch (1.5 mg total) onto the skin every 3 (three) days. (Patient not taking: Reported on 01/26/2022) 10 patch 0   Accu-Chek Softclix Lancets lancets Please use it to check blood glucose twice daily and PRN. (Patient not taking: Reported on 11/23/2021) 100 each 5   acetaminophen (TYLENOL) 325 MG tablet Take 650 mg by mouth every 6 (six) hours as needed (pain). (Patient not taking: Reported on 11/23/2021)     Blood Glucose Monitoring Suppl (ACCU-CHEK GUIDE) w/Device KIT 1 Piece by Does not apply route as directed. (Patient not taking: Reported on 11/23/2021) 1 kit 0   cadexomer iodine (IODOSORB) 0.9 % gel Apply to left  foot ulcer once daily. (Patient not taking: Reported on 10/27/2021) 40 g 0   glucose blood (ACCU-CHEK GUIDE) test strip Use as instructed (Patient not taking: Reported on 11/23/2021) 100 each 5   polyethylene glycol (MIRALAX / GLYCOLAX) 17 g packet Place 17 g into feeding tube daily.     No facility-administered medications prior to visit.    No Known Allergies  ROS Review of Systems  Constitutional:  Negative for chills and fever.  HENT:  Negative for congestion, sinus pressure and sinus pain.   Eyes:  Negative for pain and discharge.  Respiratory:  Negative for cough and shortness of breath.   Cardiovascular:  Negative for chest pain and palpitations.  Gastrointestinal:  Negative for diarrhea, nausea and vomiting.  Genitourinary:  Negative for dysuria and hematuria.  Musculoskeletal:  Negative for neck pain and neck stiffness.  Skin:  Negative for rash.  Neurological:  Positive for weakness and numbness.  Psychiatric/Behavioral:  Negative for agitation and behavioral problems.       Objective:    Physical Exam Constitutional:      General: She is not in acute distress.    Appearance: She is obese. She is not diaphoretic.  HENT:     Head: Normocephalic.     Nose: No congestion.     Mouth/Throat:     Mouth: Mucous membranes are moist.     Pharynx: No posterior oropharyngeal erythema.  Eyes:     General: No scleral icterus.    Pupils: Pupils are equal, round, and reactive to light.  Cardiovascular:     Rate and Rhythm: Normal rate and regular rhythm.     Heart sounds: Normal heart sounds. No murmur heard. Pulmonary:     Breath sounds: Normal breath sounds. No wheezing or rales.  Abdominal:     Palpations: Abdomen is soft.     Tenderness: There is no abdominal tenderness.     Comments: PEG tube in place  Musculoskeletal:     Cervical back: Neck supple. No tenderness.  Right lower leg: No edema.     Left lower leg: No edema.  Skin:    General: Skin is warm.      Findings: No rash.  Neurological:     Mental Status: She is alert and oriented to person, place, and time.     Sensory: Sensory deficit (Left UE and LE) present.     Motor: Weakness (Left UE and LE - 1/5, right UE and LE - 4/5) present.  Psychiatric:        Mood and Affect: Mood normal.        Behavior: Behavior normal.     BP 132/86 (BP Location: Right Arm, Patient Position: Sitting, Cuff Size: Normal)   Pulse 75   Resp 18   Ht _0  (1.727 m)   SpO2 94%   BMI 31.93 kg/m  Wt Readings from Last 3 Encounters:  10/27/21 210 lb (95.3 kg)  10/05/21 212 lb 11.9 oz (96.5 kg)  06/03/21 218 lb 6.4 oz (99.1 kg)    Lab Results  Component Value Date   TSH 1.265 08/24/2021   Lab Results  Component Value Date   WBC 4.2 10/05/2021   HGB 10.1 (L) 10/05/2021   HCT 31.6 (L) 10/05/2021   MCV 77.8 (L) 10/05/2021   PLT 132 (L) 10/05/2021   Lab Results  Component Value Date   NA 129 (L) 10/05/2021   K 3.9 10/05/2021   CO2 28 10/05/2021   GLUCOSE 101 (H) 10/05/2021   BUN 52 (H) 10/05/2021   CREATININE 5.00 (H) 10/05/2021   BILITOT 0.4 10/05/2021   ALKPHOS 100 10/05/2021   AST 27 10/05/2021   ALT 15 10/05/2021   PROT 7.6 10/05/2021   ALBUMIN 2.9 (L) 10/05/2021   CALCIUM 9.3 10/05/2021   ANIONGAP 14 10/05/2021   Lab Results  Component Value Date   CHOL 178 08/23/2021   Lab Results  Component Value Date   HDL 63 08/23/2021   Lab Results  Component Value Date   LDLCALC 38 08/23/2021   Lab Results  Component Value Date   TRIG 174 (H) 08/28/2021   Lab Results  Component Value Date   CHOLHDL 2.8 08/23/2021   Lab Results  Component Value Date   HGBA1C 6.2 (H) 08/23/2021      Assessment & Plan:   Problem List Items Addressed This Visit       Cardiovascular and Mediastinum   Essential hypertension - Primary    BP Readings from Last 1 Encounters:  01/26/22 132/86  Well-controlled with amlodipine and carvedilol Counseled for compliance with the  medications Advised renal diet      Relevant Orders   Ambulatory referral to [place]     Endocrine   Type 2 diabetes mellitus ([place])    Lab Results  Component Value Date   HGBA1C 6.2 (H) 08/23/2021  Well-controlled Has not been on Toujeo now Follows up with Nephrologist for ESRD on HD On kidney [facility] list [facility]      Relevant Orders   Ambulatory referral to [place] and Auditory   Left hemiplegia [facility])    Due to right-sided thalamic hemorrhage Currently wheelchair-bound Referred to home health for PT and home safety evaluation      Relevant Orders   Ambulatory referral to [place]   ESRD (end stage renal disease) on dialysis [facility])    Followed by Nephrology On HD MWF      Relevant  Orders   Ambulatory referral to [place]     Other   Dependence on renal dialysis ([place])    Followed by Nephrology On HD MWF      Intracranial hemorrhage ([place])    Had right sided thalamic hemorrhage S/p external ventricular shunt placement On amantadine Needs follow-up with neurosurgery      Status post insertion of percutaneous endoscopic gastrostomy (PEG) tube ([place])    Still has PEG tube in place Currently tolerating p.o. intake Referred to GI for removal of PEG tube      Relevant Orders   Ambulatory referral to Gastroenterology   Ambulatory referral to Home Health    No orders of the defined types were placed in this encounter.   Follow-up: Return in about 4 months (around 05/27/2022) for ESRD and CVA.    Lindell Spar, MD

## 2022-01-27 NOTE — Assessment & Plan Note (Signed)
Lab Results  Component Value Date   HGBA1C 6.2 (H) 08/23/2021   Well-controlled Has not been on Toujeo now Follows up with Nephrologist for ESRD on HD On kidney transplant list at [facility]

## 2022-01-27 NOTE — Assessment & Plan Note (Signed)
Still has PEG tube in place Currently tolerating p.o. intake Referred to GI for removal of PEG tube

## 2022-02-02 ENCOUNTER — Telehealth: Payer: Self-pay | Admitting: Internal Medicine

## 2022-02-02 NOTE — Telephone Encounter (Signed)
Verbal order giving

## 2022-02-02 NOTE — Telephone Encounter (Signed)
Lorena w. [facility] called in on patient behalf.  Verbal orders for stage one pressure sore to sacrum  Nursing 1 week 7  2 PRN visits for change in clinical status.   Call back info [place] [phone]

## 2022-02-08 ENCOUNTER — Telehealth: Payer: Self-pay | Admitting: Internal Medicine

## 2022-02-08 NOTE — Telephone Encounter (Signed)
Spoke with [employer] gave verbal

## 2022-02-08 NOTE — Telephone Encounter (Signed)
Marlori called from [facility] home health requesting home health OT orders for patient 2x 2 week    / Once week for 3 weeks Call back # [phone]

## 2022-02-23 ENCOUNTER — Telehealth: Payer: Self-pay | Admitting: Internal Medicine

## 2022-02-23 NOTE — Telephone Encounter (Signed)
Can you please resend referral to [facility]? States they have not received it.

## 2022-02-24 ENCOUNTER — Encounter (HOSPITAL_COMMUNITY): Payer: Self-pay

## 2022-02-24 ENCOUNTER — Other Ambulatory Visit: Payer: Self-pay

## 2022-02-24 ENCOUNTER — Emergency Department (HOSPITAL_COMMUNITY)
Admission: EM | Admit: 2022-02-24 | Discharge: 2022-02-24 | Disposition: A | Discharge status: Home / Self Care | Payer: 59 | Attending: Emergency Medicine | Admitting: Emergency Medicine

## 2022-02-24 DIAGNOSIS — K9423 Gastrostomy malfunction: Secondary | ICD-10-CM

## 2022-02-24 NOTE — Discharge Instructions (Signed)
You were seen in the emergency department after your PEG tube was accidentally removed.  Your bleeding was well-controlled and you had no signs of complications from the PEG tube removal.  Because you are already planning on having this removed, we did not replace it in the emergency department.  The tract can close in 24 to 72 hours and you can keep it covered with gauze as you may have some mild bleeding or leakage of fluids.  You should follow-up with your primary doctor or your GI doctor to have your PEG tube site reassessed to make sure that it is healing appropriately.  You should return to the emergency department if you have significantly worsening abdominal pain, repetitive vomiting, you have spreading redness from your PEG tube site or pus draining or if you have any other new or concerning symptoms.

## 2022-02-24 NOTE — ED Triage Notes (Signed)
Pt feeding tube fell out while pt was showering tonight. Pt just wants site checked out. Pt is supposed to have it removed on the 8th and does not use the feeding tube. PtA&O x4. History of stroke and limited mobility to left side.

## 2022-02-24 NOTE — ED Provider Notes (Signed)
[facility] EMERGENCY DEPARTMENT Provider Note   CSN: [id number] Arrival date & time: 02/24/22  2034     History  No chief complaint on file.   Janet Mitchell is a 56 y.o. female.  Patient is a 56 year old female with past medical history of hemorrhagic stroke with left-sided hemiplegia with PEG tube in place presenting to the emergency department with a dislodged PEG tube.  The patient states that she has been eating and drinking by mouth for the last several months and was planned to follow-up with GI on 12/9 to have her PEG tube removed.  She states that her family members were helping bathe her tonight and when she was getting dressed her PEG tube got caught and was accidentally pulled out.  She states that she had some bleeding that has been well-controlled.  She denies any abdominal pain, nausea or vomiting or leakage of fluids.  She states she does not want her PEG tube replaced.  The history is provided by the patient.       Home Medications Prior to Admission medications   Medication Sig Start Date End Date Taking? Authorizing Provider  amantadine (SYMMETREL) 50 MG/5ML solution Take 100 mg by mouth 2 (two) times daily.    [provider]  AMLODIPINE BENZOATE PO Take 2.5 mg by mouth daily.    [provider]  B Complex-C-Folic Acid (RENA-VITE PO) Take 1 tablet by mouth daily.    [provider]  calcitRIOL (ROCALTROL) 0.5 MCG capsule Place 2 capsules (1 mcg total) into feeding tube every Monday, Wednesday, and Friday. 10/06/21   Elodia Florence., MD  carvedilol (COREG) 6.25 MG tablet Take 6.25 mg by mouth 2 (two) times daily with a meal.    [provider]  cetirizine (ZYRTEC) 10 MG tablet TAKE 1 TABLET BY MOUTH ONCE DAILY. 06/18/21   Lindell Spar, MD  pantoprazole (PROTONIX) 20 MG tablet Take 20 mg by mouth daily.    [provider]  scopolamine (TRANSDERM-SCOP) 1 MG/3DAYS Place 1 patch (1.5 mg total) onto the skin  every 3 (three) days. Patient not taking: Reported on 01/26/2022 10/08/21   Elodia Florence., MD  traMADol (ULTRAM) 50 MG tablet Take 50 mg by mouth 2 (two) times daily as needed. 11/12/21   [provider]      Allergies    Patient has no known allergies.    Review of Systems   Review of Systems  Physical Exam Updated Vital Signs BP (!) 187/94   Pulse 76   Temp 98.7 F (37.1 C)   Resp 18   SpO2 98%  Physical Exam Vitals and nursing note reviewed.  Constitutional:      General: She is not in acute distress.    Appearance: Normal appearance.  HENT:     Head: Normocephalic and atraumatic.     Nose: Nose normal.     Mouth/Throat:     Mouth: Mucous membranes are moist.     Pharynx: Oropharynx is clear.  Eyes:     Extraocular Movements: Extraocular movements intact.     Conjunctiva/sclera: Conjunctivae normal.  Cardiovascular:     Rate and Rhythm: Normal rate.  Pulmonary:     Effort: Pulmonary effort is normal.  Abdominal:     General: Abdomen is flat.     Palpations: Abdomen is soft.     Tenderness: There is no abdominal tenderness.     Comments: PEG tube site with small amount of blood at  the tract, no active bleeding, no drainage, no surrounding erythema or warmth  Musculoskeletal:        General: No swelling or deformity.     Cervical back: Normal range of motion.  Skin:    General: Skin is warm and dry.  Neurological:     Mental Status: She is alert and oriented to person, place, and time. Mental status is at baseline.  Psychiatric:        Mood and Affect: Mood normal.        Behavior: Behavior normal.     ED Results / Procedures / Treatments   Labs (all labs ordered are listed, but only abnormal results are displayed) Labs Reviewed - No data to display  EKG None  Radiology No results found.  Procedures Procedures    Medications Ordered in ED Medications - No data to display  ED Course/ Medical Decision Making/ A&P                            Medical Decision Making This patient presents to the ED with chief complaint(s) of dislodged PEG tube with pertinent past medical history of hemorrhagic stroke with L-sided deficits which further complicates the presenting complaint. The complaint involves an extensive differential diagnosis and also carries with it a high risk of complications and morbidity.    The differential diagnosis includes dislodged PEG tube, no abdominal pain making intra-abdominal leak or gastric perforation unlikely, no active hemorrhage on exam, no evidence of cellulitis on exam   Additional history obtained: Additional history obtained from N/A Records reviewed Primary Care Documents  ED Course and Reassessment: Upon patient's arrival to the emergency department her bleeding is well-controlled and she has her PEG tube at bedside appears completely intact making a dislodged foreign body unlikely.  The patient would not like the PEG tube replaced.  She has no abdominal tenderness and has been able to tolerate p.o. for several weeks.  I have confirmed with her PCP records for the plan to have her PEG tube removed.  Patient is stable for discharge home and was recommended to follow-up with her primary doctor or GI as planned to ensure that the tract closes appropriately and is given strict return precautions.  Independent labs interpretation:  N/A  Independent visualization of imaging: N/A  Consultation: - Consulted or discussed management/test interpretation w/ external professional: N/A  Consideration for admission or further workup: Patient has no emergent conditions requiring admission or further work-up at this time and is stable for discharge home with primary care follow-up  Social Determinants of health: N/A            Final Clinical Impression(s) / ED Diagnoses Final diagnoses:  PEG tube malfunction [facility])    Rx / DC Orders ED Discharge Orders     None         Kemper Durie, DO 02/24/22 2111

## 2022-02-25 ENCOUNTER — Telehealth: Payer: Self-pay

## 2022-02-25 NOTE — Telephone Encounter (Signed)
Sent referral to web site and called and LMOM - could not find fax # and only had phone # with voice mail

## 2022-02-25 NOTE — Telephone Encounter (Signed)
Transition Care Management Unsuccessful Follow-up Telephone Call  Date of discharge and from where:  [facility] 02/24/2022  Attempts:  1st Attempt  Reason for unsuccessful TCM follow-up call:  No answer/busy Juanda Crumble, [place] Direct Dial [phone]

## 2022-02-28 NOTE — Telephone Encounter (Signed)
Transition Care Management Unsuccessful Follow-up Telephone Call  Date of discharge and from where:  [facility] 02/24/2022  Attempts:  2nd Attempt  Reason for unsuccessful TCM follow-up call:  No answer/busy Juanda Crumble, [place] Direct Dial [phone]

## 2022-03-01 NOTE — Telephone Encounter (Signed)
Transition Care Management Unsuccessful Follow-up Telephone Call  Date of discharge and from where:  [facility] 02/24/2022  Attempts:  3rd Attempt  Reason for unsuccessful TCM follow-up call:  No answer/busy Juanda Crumble, LPN [place] Direct Dial [phone]

## 2022-03-02 ENCOUNTER — Encounter: Payer: Self-pay | Admitting: Internal Medicine

## 2022-03-02 ENCOUNTER — Ambulatory Visit (INDEPENDENT_AMBULATORY_CARE_PROVIDER_SITE_OTHER): Payer: 59 | Admitting: Internal Medicine

## 2022-03-02 VITALS — BP 156/91 | HR 74 | Temp 98.1°F | Ht 68.0 in

## 2022-03-02 DIAGNOSIS — K219 Gastro-esophageal reflux disease without esophagitis: Secondary | ICD-10-CM | POA: Diagnosis not present

## 2022-03-02 DIAGNOSIS — D123 Benign neoplasm of transverse colon: Secondary | ICD-10-CM

## 2022-03-02 MED ORDER — PANTOPRAZOLE SODIUM 40 MG PO TBEC: 40.0000 mg | DELAYED_RELEASE_TABLET | Freq: Every day | ORAL | 3 refills | Status: AC

## 2022-03-02 NOTE — Progress Notes (Signed)
Primary Care Physician:  Lindell Spar, MD Primary Gastroenterologist:  Dr. Gala Romney  Chief Complaint  Patient presents with   Follow-up    Patient arrives with spouse Joneen Boers for ED follow up visit. Peg tubed was removed and patient does not want put back in.     HPI:   Janet Mitchell is a 56 y.o. female who presents to the clinic today by way of her PCP Dr. Posey Pronto..  She unfortunately suffered an Intracranial hemorrhage May 2023 status post external ventricular shunt placement.  Required trach and PEG tube placement.  Subsequently at skilled nursing facility for months until discharged end of October.  She was referred to [place] to discuss PEG tube removal.  However, this was accidentally removed on 02/24/2022 while giving her a bath at home.  Patient tolerating p.o. intake both liquids and solids.  No dysphagia odynophagia.  No epigastric or chest pain.  Does note chronic reflux.  Currently taking pantoprazole 20 mg daily with breakthrough symptoms.  Due for surveillance colonoscopy.  15 mm tubular adenoma removed from her transverse colon 2023.  No melena hematochezia.  Historically has had issues with diarrhea though states her bowel movements are normal for the most part.  If she eats something greasy then she will have diarrhea.  Takes Imodium as needed.  Past Medical History:  Diagnosis Date   Abdominal wall hematoma 12/26/2019   Anemia    Arthritis    CHF (congestive heart failure) ([place])    pt states she has been cleared of heart failure/disease   Chronic cholecystitis with calculus    COVID-19 03/26/2019   COVID-19 virus infection 03/2019   ESRD (end stage renal disease) on dialysis [facility])    Dialysis M-W-F   Headache    "a few/wk" (03/09/2018) - no longer having these   History of blood transfusion 10/2017   "low blood count" (03/09/2018)   Hypertension    Respiratory failure requiring intubation (HCC)    Seasonal allergies    Spinal headache    Type II diabetes  mellitus ([place])     Past Surgical History:  Procedure Laterality Date   AMPUTATION TOE Left 2013   Great toe   AV FISTULA PLACEMENT Left 07/22/2019   Procedure: LEFT ARM BASILIC ARTERIOVENOUS (AV) FISTULA CREATION;  Surgeon: Rosetta Posner, MD;  Location: [place];  Service: Vascular;  Laterality: Left;   [place] Left 10/17/2019   Procedure: LEFT SECOND STAGE [place];  Surgeon: Rosetta Posner, MD;  Location: [place];  Service: Vascular;  Laterality: Left;   BIOPSY  10/24/2018   Procedure: BIOPSY;  Surgeon: Daneil Dolin, MD;  [facility];  Service: Endoscopy;;  right and left colon   CATARACT EXTRACTION W/ INTRAOCULAR LENS IMPLANT Right    [place]; [place] N/A 03/09/2018   Procedure: LAPAROSCOPIC CHOLECYSTECTOMY WITH INTRAOPERATIVE CHOLANGIOGRAM ERAS PATHWAY;  Surgeon: Donnie Mesa, MD;  Location: [place];  Service: General;  Laterality: N/A;   COLONOSCOPY N/A 10/24/2018   Procedure: COLONOSCOPY;  Surgeon: Daneil Dolin, MD;  [facility];  Service: Endoscopy;  Laterality: N/A;  1:00pm   EYE SURGERY Left 05/15/2018   Removal of blood in the globe (due to DM)   FLEXIBLE SIGMOIDOSCOPY N/A 11/24/2017   Procedure: FLEXIBLE SIGMOIDOSCOPY;  Surgeon: Daneil Dolin, MD;  [facility];  Service: Endoscopy;  Laterality: N/A;   IR FLUORO GUIDE CV LINE RIGHT  07/17/2019  IR GASTROSTOMY TUBE MOD SED  09/16/2021   IR PARACENTESIS  07/09/2019   IR US GUIDE VASC ACCESS RIGHT  07/17/2019   LAPAROSCOPIC CHOLECYSTECTOMY  03/09/2018   LAPAROSCOPIC REVISION VENTRICULAR-PERITONEAL (V-P) SHUNT  09/08/2021   Procedure: LAPAROSCOPIC ASSISTANCE FOR  VENTRICULAR-PERITONEAL (V-P) SHUNT PLACEMENT ;  Surgeon: Vallarie Mare, MD;  Location[facility];  Service: Neurosurgery;;   PERITONEAL CATHETER INSERTION  2017   POLYPECTOMY  10/24/2018   Procedure: POLYPECTOMY;  Surgeon: Daneil Dolin, MD;  [facility];   Service: Endoscopy;;   TOTAL HIP ARTHROPLASTY Left 1997   VENTRICULOPERITONEAL SHUNT N/A 09/08/2021   Procedure: SHUNT INSERTION VENTRICULAR-PERITONEAL;  Surgeon: Vallarie Mare, MD;  Location: [place];  Service: Neurosurgery;  Laterality: N/A;    Current Outpatient Medications  Medication Sig Dispense Refill   AMLODIPINE BENZOATE PO Take 2.5 mg by mouth daily.     B Complex-C-Folic Acid (RENA-VITE PO) Take 1 tablet by mouth daily.     carvedilol (COREG) 6.25 MG tablet Take 6.25 mg by mouth 2 (two) times daily with a meal.     cetirizine (ZYRTEC) 10 MG tablet TAKE 1 TABLET BY MOUTH ONCE DAILY. 30 tablet 0   pantoprazole (PROTONIX) 20 MG tablet Take 20 mg by mouth daily.     scopolamine (TRANSDERM-SCOP) 1 MG/3DAYS Place 1 patch (1.5 mg total) onto the skin every 3 (three) days. 10 patch 0   traMADol (ULTRAM) 50 MG tablet Take 50 mg by mouth 2 (two) times daily as needed.     amantadine (SYMMETREL) 50 MG/5ML solution Take 100 mg by mouth 2 (two) times daily. (Patient not taking: Reported on 03/02/2022)     calcitRIOL (ROCALTROL) 0.5 MCG capsule Place 2 capsules (1 mcg total) into feeding tube every Monday, Wednesday, and Friday. (Patient not taking: Reported on 03/02/2022)     No current facility-administered medications for this visit.    Allergies as of 03/02/2022   (No Known Allergies)    Family History  Problem Relation Age of Onset   Heart disease Mother    Thrombocytopenia Mother        TTP   Heart failure Father    Kidney disease Paternal Grandfather    Colon cancer Neg Hx     Social History   Socioeconomic History   Marital status: Married    Spouse name: Not on file   Number of children: 2   Years of education: Not on file   Highest education level: Not on file  Occupational History   Not on file  Tobacco Use   Smoking status: Never    Passive exposure: Yes   Smokeless tobacco: Never  Vaping Use   Vaping Use: Never used  Substance and Sexual Activity    Alcohol use: Never   Drug use: Never   Sexual activity: Yes    Birth control/protection: I.U.D.    Comment: Mirena IUD  Other Topics Concern   Not on file  Social History Narrative   Not on file   Social Determinants of Health   Financial Resource Strain: Low Risk  (11/30/2021)   Overall Financial Resource Strain (CARDIA)    Difficulty of Paying Living Expenses: Not hard at all  Food Insecurity: No Food Insecurity (11/30/2021)   Hunger Vital Sign    Worried About Running Out of Food in the Last Year: Never true    Ran Out of Food in the Last Year: Never true  Transportation Needs: No Transportation Needs (11/30/2021)  PRAPARE - Hydrologist (Medical): No    Lack of Transportation (Non-Medical): No  Physical Activity: Sufficiently Active (11/30/2021)   Exercise Vital Sign    Days of Exercise per Week: 5 days    Minutes of Exercise per Session: 30 min  Stress: No Stress Concern Present (11/30/2021)   [place]    Feeling of Stress : Not at all  Social Connections: [place] (11/30/2021)   Social Connection and Isolation Panel [NHANES]    Frequency of Communication with Friends and Family: More than three times a week    Frequency of Social Gatherings with Friends and Family: More than three times a week    Attends Religious Services: More than 4 times per year    Active Member of [employer] or Organizations: Yes    Attends Music therapist: More than 4 times per year    Marital Status: Married  Human resources officer Violence: Not At Risk (11/30/2021)   Humiliation, Afraid, Rape, and Kick questionnaire    Fear of Current or Ex-Partner: No    Emotionally Abused: No    Physically Abused: No    Sexually Abused: No    Subjective: Review of Systems  Constitutional:  Negative for chills and fever.  HENT:  Negative for congestion and hearing loss.   Eyes:  Negative for blurred vision  and double vision.  Respiratory:  Negative for cough and shortness of breath.   Cardiovascular:  Negative for chest pain and palpitations.  Gastrointestinal:  Negative for abdominal pain, blood in stool, constipation, diarrhea, heartburn, melena and vomiting.  Genitourinary:  Negative for dysuria and urgency.  Musculoskeletal:  Negative for joint pain and myalgias.  Skin:  Negative for itching and rash.  Neurological:  Negative for dizziness and headaches.  Psychiatric/Behavioral:  Negative for depression. The patient is not nervous/anxious.        Objective: BP (!) 156/91 (BP Location: Right Arm, Patient Position: Sitting, Cuff Size: Large) Comment: 162/92 on recheck  Pulse 74   Temp 98.1 F (36.7 C) (Oral)   Ht '5\' 8"'$  (1.727 m)   BMI 31.93 kg/m  Physical Exam Constitutional:      Appearance: Normal appearance. She is obese.     Comments: Wheelchair-bound  HENT:     Head: Normocephalic and atraumatic.  Eyes:     Extraocular Movements: Extraocular movements intact.     Conjunctiva/sclera: Conjunctivae normal.  Cardiovascular:     Rate and Rhythm: Normal rate and regular rhythm.  Pulmonary:     Effort: Pulmonary effort is normal.     Breath sounds: Normal breath sounds.  Abdominal:     General: Bowel sounds are normal.     Palpations: Abdomen is soft.  Musculoskeletal:        General: No swelling. Normal range of motion.     Cervical back: Normal range of motion and neck supple.  Skin:    General: Skin is warm and dry.     Coloration: Skin is not jaundiced.  Neurological:     Mental Status: She is alert and oriented to person, place, and time.  Psychiatric:        Mood and Affect: Mood normal.        Behavior: Behavior normal.      Assessment: *Chronic GERD-not well-controlled *History of hemorrhagic stroke with PEG placement, recently removed *Adenomatous colon polyps  Plan: For patient's chronic GERD, we will increase her pantoprazole to  40 mg daily.   Prescription sent to pharmacy.  PEG tube has been removed albeit accidentally by patient.  Tolerating p.o. intake.  PEG site looks good today.  Recommend to wash this with soap and water.  Patient due for surveillance colonoscopy though given everything that she has been through this year, she would like to hold off for now.  Will discuss on follow-up visit in 6 months.    Patient's primary gastroenterologist is Dr. Gala Romney, who we can schedule colonoscopy with on follow-up visit.  03/02/2022 1:57 PM   Disclaimer: This note was dictated with voice recognition software. Similar sounding words can inadvertently be transcribed and may not be corrected upon review.

## 2022-03-02 NOTE — Patient Instructions (Signed)
Your PEG insertion site looks good.  Please continue to clean this with soap and water.  For your chronic acid reflux, I will increase your pantoprazole to 40 mg daily.  You are due for colonoscopy for surveillance purposes though given everything you have been through this year, we will hold off for now.  Follow-up with GI in 6 months and we can discuss then.  It was very nice meeting both you today.  Dr. Abbey Chatters

## 2022-03-04 ENCOUNTER — Emergency Department (HOSPITAL_COMMUNITY): Payer: 59

## 2022-03-04 ENCOUNTER — Other Ambulatory Visit: Payer: Self-pay

## 2022-03-04 ENCOUNTER — Encounter (HOSPITAL_COMMUNITY): Payer: Self-pay

## 2022-03-04 ENCOUNTER — Emergency Department (HOSPITAL_COMMUNITY)
Admission: EM | Admit: 2022-03-04 | Discharge: 2022-03-04 | Disposition: A | Discharge status: Home / Self Care | Payer: 59 | Attending: Emergency Medicine | Admitting: Emergency Medicine

## 2022-03-04 DIAGNOSIS — Z992 Dependence on renal dialysis: Secondary | ICD-10-CM | POA: Diagnosis not present

## 2022-03-04 DIAGNOSIS — G43809 Other migraine, not intractable, without status migrainosus: Secondary | ICD-10-CM | POA: Diagnosis present

## 2022-03-04 DIAGNOSIS — G8194 Hemiplegia, unspecified affecting left nondominant side: Secondary | ICD-10-CM | POA: Diagnosis not present

## 2022-03-04 DIAGNOSIS — E119 Type 2 diabetes mellitus without complications: Secondary | ICD-10-CM | POA: Diagnosis not present

## 2022-03-04 DIAGNOSIS — N186 End stage renal disease: Secondary | ICD-10-CM | POA: Diagnosis not present

## 2022-03-04 DIAGNOSIS — Z79899 Other long term (current) drug therapy: Secondary | ICD-10-CM | POA: Insufficient documentation

## 2022-03-04 DIAGNOSIS — H53462 Homonymous bilateral field defects, left side: Secondary | ICD-10-CM

## 2022-03-04 DIAGNOSIS — R519 Headache, unspecified: Secondary | ICD-10-CM | POA: Diagnosis not present

## 2022-03-04 DIAGNOSIS — I120 Hypertensive chronic kidney disease with stage 5 chronic kidney disease or end stage renal disease: Secondary | ICD-10-CM | POA: Insufficient documentation

## 2022-03-04 LAB — DIFFERENTIAL
Abs Immature Granulocytes: 0.01 10*3/uL (ref 0.00–0.07)
Basophils Absolute: 0.1 10*3/uL (ref 0.0–0.1)
Basophils Relative: 1 %
Eosinophils Absolute: 0.2 10*3/uL (ref 0.0–0.5)
Eosinophils Relative: 5 %
Immature Granulocytes: 0 %
Lymphocytes Relative: 34 %
Lymphs Abs: 1.4 10*3/uL (ref 0.7–4.0)
Monocytes Absolute: 0.4 10*3/uL (ref 0.1–1.0)
Monocytes Relative: 11 %
Neutro Abs: 2 10*3/uL (ref 1.7–7.7)
Neutrophils Relative %: 49 %

## 2022-03-04 LAB — CBC
HCT: 31 % — ABNORMAL LOW (ref 36.0–46.0)
Hemoglobin: 9.7 g/dL — ABNORMAL LOW (ref 12.0–15.0)
MCH: 23.8 pg — ABNORMAL LOW (ref 26.0–34.0)
MCHC: 31.3 g/dL (ref 30.0–36.0)
MCV: 76.2 fL — ABNORMAL LOW (ref 80.0–100.0)
Platelets: 138 10*3/uL — ABNORMAL LOW (ref 150–400)
RBC: 4.07 MIL/uL (ref 3.87–5.11)
RDW: 18.5 % — ABNORMAL HIGH (ref 11.5–15.5)
WBC: 4.1 10*3/uL (ref 4.0–10.5)
nRBC: 0 % (ref 0.0–0.2)

## 2022-03-04 LAB — BLOOD GAS, VENOUS
Acid-Base Excess: 9.4 mmol/L — ABNORMAL HIGH (ref 0.0–2.0)
Bicarbonate: 34.8 mmol/L — ABNORMAL HIGH (ref 20.0–28.0)
Drawn by: 1517
O2 Saturation: 83.3 %
Patient temperature: 36.4
pCO2, Ven: 49 mmHg (ref 44–60)
pH, Ven: 7.46 — ABNORMAL HIGH (ref 7.25–7.43)
pO2, Ven: 48 mmHg — ABNORMAL HIGH (ref 32–45)

## 2022-03-04 LAB — COMPREHENSIVE METABOLIC PANEL
ALT: 16 U/L (ref 0–44)
AST: 36 U/L (ref 15–41)
Albumin: 3 g/dL — ABNORMAL LOW (ref 3.5–5.0)
Alkaline Phosphatase: 68 U/L (ref 38–126)
Anion gap: 9 (ref 5–15)
BUN: 21 mg/dL — ABNORMAL HIGH (ref 6–20)
CO2: 28 mmol/L (ref 22–32)
Calcium: 8.2 mg/dL — ABNORMAL LOW (ref 8.9–10.3)
Chloride: 98 mmol/L (ref 98–111)
Creatinine, Ser: 3.47 mg/dL — ABNORMAL HIGH (ref 0.44–1.00)
GFR, Estimated: 15 mL/min — ABNORMAL LOW (ref >60)
Glucose, Bld: 147 mg/dL — ABNORMAL HIGH (ref 70–99)
Potassium: 5.8 mmol/L — ABNORMAL HIGH (ref 3.5–5.1)
Sodium: 135 mmol/L (ref 135–145)
Total Bilirubin: 0.8 mg/dL (ref 0.3–1.2)
Total Protein: 6.7 g/dL (ref 6.5–8.1)

## 2022-03-04 LAB — I-STAT CHEM 8, ED
BUN: 30 mg/dL — ABNORMAL HIGH (ref 6–20)
Calcium, Ion: 0.94 mmol/L — ABNORMAL LOW (ref 1.15–1.40)
Chloride: 98 mmol/L (ref 98–111)
Creatinine, Ser: 3.6 mg/dL — ABNORMAL HIGH (ref 0.44–1.00)
Glucose, Bld: 147 mg/dL — ABNORMAL HIGH (ref 70–99)
HCT: 34 % — ABNORMAL LOW (ref 36.0–46.0)
Hemoglobin: 11.6 g/dL — ABNORMAL LOW (ref 12.0–15.0)
Potassium: 6.5 mmol/L (ref 3.5–5.1)
Sodium: 135 mmol/L (ref 135–145)
TCO2: 31 mmol/L (ref 22–32)

## 2022-03-04 LAB — PROTIME-INR
INR: 1.2 (ref 0.8–1.2)
Prothrombin Time: 14.7 seconds (ref 11.4–15.2)

## 2022-03-04 LAB — ETHANOL: Alcohol, Ethyl (B): <10 mg/dL (ref <10)

## 2022-03-04 LAB — CBG MONITORING, ED: Glucose-Capillary: 147 mg/dL — ABNORMAL HIGH (ref 70–99)

## 2022-03-04 LAB — APTT: aPTT: 32 seconds (ref 24–36)

## 2022-03-04 MED ORDER — HYDROMORPHONE HCL 1 MG/ML IJ SOLN
1.0000 mg | Freq: Once | INTRAMUSCULAR | Status: AC
  Administered 2022-03-04: 1 mg via INTRAVENOUS
  Filled 2022-03-04: qty 1

## 2022-03-04 MED ORDER — CARVEDILOL 12.5 MG PO TABS: 12.5000 mg | ORAL_TABLET | Freq: Two times a day (BID) | ORAL | Status: DC

## 2022-03-04 MED ORDER — DIPHENHYDRAMINE HCL 50 MG/ML IJ SOLN
25.0000 mg | Freq: Once | INTRAMUSCULAR | Status: AC
  Administered 2022-03-04: 25 mg via INTRAVENOUS
  Filled 2022-03-04: qty 1

## 2022-03-04 MED ORDER — METOCLOPRAMIDE HCL 5 MG/ML IJ SOLN
10.0000 mg | Freq: Once | INTRAMUSCULAR | Status: AC
  Administered 2022-03-04: 10 mg via INTRAVENOUS
  Filled 2022-03-04: qty 2

## 2022-03-04 MED ORDER — SODIUM ZIRCONIUM CYCLOSILICATE 5 G PO PACK
10.0000 g | PACK | Freq: Two times a day (BID) | ORAL | Status: DC
  Administered 2022-03-04 (×2): 10 g via ORAL
  Filled 2022-03-04 (×2): qty 2

## 2022-03-04 MED ORDER — CARVEDILOL 12.5 MG PO TABS
12.5000 mg | ORAL_TABLET | Freq: Once | ORAL | Status: AC
  Administered 2022-03-04: 12.5 mg via ORAL
  Filled 2022-03-04: qty 1

## 2022-03-04 MED ORDER — IOHEXOL 350 MG/ML SOLN
100.0000 mL | Freq: Once | INTRAVENOUS | Status: AC | PRN
  Administered 2022-03-04: 100 mL via INTRAVENOUS

## 2022-03-04 NOTE — ED Notes (Signed)
Patient in CT at this time. Neurologist requests to remain on screen during exam. Neurologist states that she will need perfusion scan once he reads plain head CT. Patient difficult stick, awaiting US guided assistance. IV started at 1029. Patient arrived back to room at 1043, Neuro/Neuro nurse have been on screen during CT scan. Treatment decision not to administer TNK made at 1043 and Neurologist states that he will notify EDP.

## 2022-03-04 NOTE — ED Notes (Signed)
Patient to MRI at this time.

## 2022-03-04 NOTE — Consult Note (Signed)
[facility] Telemedicine Consult   Requesting Provider: Tedd Sias Consult Participants: Bedside nurses, telestroke nurse Location of the provider: [facility] Location of the patient: [facility]  This consult was provided via telemedicine with 2-way video and audio communication. The patient/family was informed that care would be provided in this way and agreed to receive care in this manner.    Chief Complaint: Vomiting  HPI: 56 yo F with a history of ICH with subsequent left hemiparesis who was at dialysis when she began vomiting and had worsening of her baseline deficits and therefore a code stroke was activated.  She does not feel like her deficits are any worse than they are typically, but she does have neglect eat, even at baseline.  With her last progress note from her outpatient neurologist, they did not note any hemianopia but there is one noted today.  She does complain of headache which is holocephalic, she states feels like one of her typical migraines and she had significant nausea and vomiting during dialysis.  She states that the headache has been going on for 2 days.    LKW: 9:20 am  tpa given?: No, previous ICH IR Thrombectomy? No, no LVO Modified Rankin Scale: 4-Needs assistance to walk and tend to bodily needs Time of teleneurologist evaluation: 10:00  Exam: There were no vitals filed for this visit.  General: in bed, nad  1A: Level of Consciousness - 0 1B: Ask Month and Age - 1 1C: 'Blink Eyes' & 'Squeeze Hands' - 0 2: Test Horizontal Extraocular Movements - 1 3: Test Visual Fields - 2 4: Test Facial Palsy - 1 5A: Test Left Arm Motor Drift - 3 5B: Test Right Arm Motor Drift - 0 6A: Test Left Leg Motor Drift - 4 6B: Test Right Leg Motor Drift - 2 7: Test Limb Ataxia - 0 8: Test Sensation - 1 9: Test Language/Aphasia- 0 10: Test Dysarthria - 1 11: Test Extinction/Inattention - 1 NIHSS score: 17   Imaging Reviewed: CT head - no ICH, CT  angiogram reveals no large vessel occlusion, CT perfusion reveals an elevated Tmax without corresponding decreases in CBF or CBV, unclear significance  There is a gas bubble in her reservoir for her VP shunt  Labs reviewed in epic and pertinent values follow: CBG 147   Assessment: 56 yo F with worsened weakness and slurred speech that started during dialysis.  The majority of her symptoms have been present to some degree since her previous stroke.  Given the description of the headache as like her typical migraines, migraine with some recrudescence of her previous symptoms in the setting of migraine is a possibility.  She will need an MRI to rule out ischemic stroke superimposed on a history of hemorrhagic stroke.  She is not a candidate for IV tenecteplase given her history of intracerebral hemorrhage, not a candidate for thrombectomy due to no large vessel occlusion.   I do think that the gas bubble in the reservoir is slightly unusual, but I do not look at the CTs of another VP shunt's to know how unusual.  I would discuss with neurosurgery whether this is a finding of concern, especially in the setting of headache.  Recommendations:  1) discuss gas and VP shunt reservoir with neurosurgery 2) MRI brain 3) further stroke workup only if MRI is positive. 4) if above are all unremarkable, could consider trying migraine treatment.   Roland Rack, MD [facility] [phone]  If 7pm- 7am, please page neurology on  call as listed in [place].

## 2022-03-04 NOTE — Discharge Instructions (Addendum)
Workup for any concerns of stroke without any acute findings.  Headache only completely resolved with migraine medication.  Contact your dialysis center since she did not complete dialysis today.  Potassium elevated patient was given 2 doses of Lokelma here.  Patient may need to complete dialysis tomorrow and you need outpatient workup for sleep apnea.

## 2022-03-04 NOTE — ED Notes (Signed)
Patient states that she does not make urine.

## 2022-03-04 NOTE — Progress Notes (Signed)
0957 call time 0957 beeper time 1011 exam started 1012 exam finished 1012 images sent to soc 1016 exam completed in epic 1016 [facility] called

## 2022-03-04 NOTE — ED Triage Notes (Signed)
Patient arrived from dialysis center after reportedly developing right side facial droop, right side weakness, slurred speech at approx. 8453. Patient had been on dialysis machine for 1.5 liters of fluid removal prior to change in condition and had arrived to facility normal. HX of CVA with left sided deficits. Patient has been complaining of headache for past two days. Code stroke called in field at [phone]. Patient states that she has noticed no changes from baseline herself.

## 2022-03-04 NOTE — ED Notes (Signed)
Patient's oxygen level dropped to 38% RA, decreased responsiveness. NRB placed, sternal rub performed and patient aroused with improvement in oxygenation. MD called to room with new orders received.

## 2022-03-04 NOTE — Progress Notes (Addendum)
EMS pre elert [phone]  Neuro paged [phone] Dr. Leonel Ramsay on screen 1000 EDP assessing 1000 Pt to CT 1005  Per EMS pt was at dialysis when at [place] she began having R sided gaze preference, R sided weakness, slurred speech. Hx stroke with L sided deficits.  Hx ICH - no TNK mRS 4

## 2022-03-04 NOTE — ED Provider Notes (Signed)
[facility] EMERGENCY DEPARTMENT Provider Note   CSN: [id number] Arrival date & time: 03/04/22  1000     History  Chief Complaint  Patient presents with   Code Stroke    Janet Mitchell is a 56 y.o. female.  This is a duplicate note.  Complete note already filed for the patient's ED visit.       Home Medications Prior to Admission medications   Medication Sig Start Date End Date Taking? Authorizing Provider  AMLODIPINE BENZOATE PO Take 2.5 mg by mouth daily.   Yes [provider]  B Complex-C-Folic Acid (RENA-VITE PO) Take 1 tablet by mouth daily.   Yes [provider]  cetirizine (ZYRTEC) 10 MG tablet TAKE 1 TABLET BY MOUTH ONCE DAILY. 06/18/21  Yes Lindell Spar, MD  pantoprazole (PROTONIX) 40 MG tablet Take 1 tablet (40 mg total) by mouth daily. 03/02/22  Yes Eloise Harman, DO  scopolamine (TRANSDERM-SCOP) 1 MG/3DAYS Place 1 patch (1.5 mg total) onto the skin every 3 (three) days. 10/08/21  Yes Elodia Florence., MD  traMADol (ULTRAM) 50 MG tablet Take 50 mg by mouth 2 (two) times daily as needed. 11/12/21  Yes [provider]  amantadine (SYMMETREL) 50 MG/5ML solution Take 100 mg by mouth 2 (two) times daily. Patient not taking: Reported on 03/02/2022    [provider]  amLODipine (NORVASC) 2.5 MG tablet TAKE 2 TABLETS BY MOUTH ONCE DAILY. 03/09/22   Lindell Spar, MD  calcitRIOL (ROCALTROL) 0.5 MCG capsule TAKE 2 CAPSULES BY MOUTH EVERY MONDAY, WEDNESDAY, AND FRIDAY FOR KIDNEY DISEASE. 03/08/22   Lindell Spar, MD  carvedilol (COREG) 6.25 MG tablet TAKE (1) TABLET BY MOUTH TWICE DAILY. 03/08/22   Lindell Spar, MD  multivitamin (RENA-VIT) TABS tablet TAKE (1) TABLET BY MOUTH ONCE DAILY FOR KIDNEY DISEASE. 03/08/22   Lindell Spar, MD      Allergies    Patient has no known allergies.    Review of Systems   Review of Systems  Physical Exam Updated Vital Signs BP (!) 191/105   Pulse 76   Temp 98.1 F (36.7 C)  (Oral)   Resp 12   Ht 1.727 m ('5\' 8"'$ )   Wt 94.5 kg   SpO2 96%   BMI 31.68 kg/m  Physical Exam  ED Results / Procedures / Treatments   Labs (all labs ordered are listed, but only abnormal results are displayed) Labs Reviewed  CBC - Abnormal; Notable for the following components:      Result Value   Hemoglobin 9.7 (*)    HCT 31.0 (*)    MCV 76.2 (*)    MCH 23.8 (*)    RDW 18.5 (*)    Platelets 138 (*)    All other components within normal limits  COMPREHENSIVE METABOLIC PANEL - Abnormal; Notable for the following components:   Potassium 5.8 (*)    Glucose, Bld 147 (*)    BUN 21 (*)    Creatinine, Ser 3.47 (*)    Calcium 8.2 (*)    Albumin 3.0 (*)    GFR, Estimated 15 (*)    All other components within normal limits  BLOOD GAS, VENOUS - Abnormal; Notable for the following components:   pH, Ven 7.46 (*)    pO2, Ven 48 (*)    Bicarbonate 34.8 (*)    Acid-Base Excess 9.4 (*)    All other components within normal limits  CBG MONITORING, ED - Abnormal; Notable for the  following components:   Glucose-Capillary 147 (*)    All other components within normal limits  I-STAT CHEM 8, ED - Abnormal; Notable for the following components:   Potassium 6.5 (*)    BUN 30 (*)    Creatinine, Ser 3.60 (*)    Glucose, Bld 147 (*)    Calcium, Ion 0.94 (*)    Hemoglobin 11.6 (*)    HCT 34.0 (*)    All other components within normal limits  ETHANOL  PROTIME-INR  APTT  DIFFERENTIAL    EKG EKG Interpretation  Date/Time:  Friday March 04 2022 11:00:30 EST Ventricular Rate:  74 PR Interval:  204 QRS Duration: 92 QT Interval:  434 QTC Calculation: 482 R Axis:   -47 Text Interpretation: Sinus rhythm Left anterior fascicular block Abnormal R-wave progression, early transition Abnormal T, consider ischemia, lateral leads Confirmed by Fredia Sorrow [phone]) on 03/04/2022 12:51:27 PM  Radiology No results found.  Procedures Procedures    Medications Ordered in ED Medications   iohexol (OMNIPAQUE) 350 MG/ML injection 100 mL (100 mLs Intravenous Contrast Given 03/04/22 1021)  HYDROmorphone (DILAUDID) injection 1 mg (1 mg Intravenous Given 03/04/22 1309)  diphenhydrAMINE (BENADRYL) injection 25 mg (25 mg Intravenous Given 03/04/22 1309)  metoCLOPramide (REGLAN) injection 10 mg (10 mg Intravenous Given 03/04/22 1309)  carvedilol (COREG) tablet 12.5 mg (12.5 mg Oral Given 03/04/22 2319)    ED Course/ Medical Decision Making/ A&P Clinical Course as of 03/14/22 1834  Fri Mar 04, 2022  1803 Called to patient's bedside as she had an episode satting 30s on room air with good Pleth. However woken up with stimulation  Patient was placed on nonrebreather with improvement.  She is now satting 90s on room air with no increased work of breathing breath sounds are clear.  Suspect likely secondary to medications administered earlier today for migraine with Dilaudid Reglan and Benadryl and poss associated OSA.  She had received Lokelma for hyperkalemia 5.8.  She had finished half her session for dialysis today. Ordered rpt CXR andVBG. [VB]  1855 Repeat chest x-ray unchanged and VBG reassuring with no hypercarbia pCO2 49.  pH 7.46. [VB]  2242 Patient observed for another 5 hours without further desat safe for discharge and follow up dialysis.  Of note she was discharged hypertensive.  However without symptoms of hypertensive emergency.  Husband was called, patient is baseline mental impairment from previous strokes and bedbound.  She will need dialysis tomorrow to complete. [VB]    Clinical Course User Index [VB] Elgie Congo, MD                           Medical Decision Making Amount and/or Complexity of Data Reviewed Labs: ordered. Radiology: ordered.  Risk Prescription drug management.   Duplicate note ED visit 45 for this patient for today complete.   Final Clinical Impression(s) / ED Diagnoses Final diagnoses:  Other migraine without status migrainosus, not  intractable  ESRD on dialysis [facility])    Rx / DC Orders ED Discharge Orders     None         Fredia Sorrow, MD 03/14/22 [phone]

## 2022-03-04 NOTE — ED Provider Notes (Signed)
[facility] EMERGENCY DEPARTMENT Provider Note   CSN: [id number] Arrival date & time: 03/04/22  1000  An emergency department physician performed an initial assessment on this suspected stroke patient at 1005.  History  Chief Complaint  Patient presents with   Code Stroke    Janet Mitchell is a 56 y.o. female.  Patient has been wearing referral to the dialysis treatment.  Stroke.  Patient today is hemodialysis patient normally dialyzed Monday Wednesdays and Fridays.   Patient has a history of left hemiparesis due to intracranial hemorrhage.  During dialysis they thought that there was increased weakness on the left side and some question about visual changes.  Past medical history otherwise significant for history of headaches yes hypertension type 2 diabetes congestive heart failure.  Code stroke was activated upon arrival.  Onset of symptoms were around 930.  Earliest would have been around 9.       Home Medications Prior to Admission medications   Medication Sig Start Date End Date Taking? Authorizing Provider  AMLODIPINE BENZOATE PO Take 2.5 mg by mouth daily.   Yes [provider]  B Complex-C-Folic Acid (RENA-VITE PO) Take 1 tablet by mouth daily.   Yes [provider]  calcitRIOL (ROCALTROL) 0.5 MCG capsule Place 2 capsules (1 mcg total) into feeding tube every Monday, Wednesday, and Friday. 10/06/21  Yes Elodia Florence., MD  carvedilol (COREG) 6.25 MG tablet Take 6.25 mg by mouth 2 (two) times daily with a meal.   Yes [provider]  cetirizine (ZYRTEC) 10 MG tablet TAKE 1 TABLET BY MOUTH ONCE DAILY. 06/18/21  Yes Lindell Spar, MD  pantoprazole (PROTONIX) 40 MG tablet Take 1 tablet (40 mg total) by mouth daily. 03/02/22  Yes Eloise Harman, DO  scopolamine (TRANSDERM-SCOP) 1 MG/3DAYS Place 1 patch (1.5 mg total) onto the skin every 3 (three) days. 10/08/21  Yes Elodia Florence., MD  traMADol (ULTRAM) 50 MG tablet Take 50 mg  by mouth 2 (two) times daily as needed. 11/12/21  Yes [provider]  amantadine (SYMMETREL) 50 MG/5ML solution Take 100 mg by mouth 2 (two) times daily. Patient not taking: Reported on 03/02/2022    [provider]      Allergies    Patient has no known allergies.    Review of Systems   Review of Systems  Constitutional:  Negative for chills and fever.  HENT:  Negative for ear pain and sore throat.   Eyes:  Positive for visual disturbance. Negative for pain.  Respiratory:  Negative for cough and shortness of breath.   Cardiovascular:  Negative for chest pain and palpitations.  Gastrointestinal:  Negative for abdominal pain and vomiting.  Genitourinary:  Negative for dysuria and hematuria.  Musculoskeletal:  Negative for arthralgias and back pain.  Skin:  Negative for color change and rash.  Neurological:  Positive for weakness and headaches. Negative for seizures and syncope.  All other systems reviewed and are negative.   Physical Exam Updated Vital Signs BP (!) 195/99   Pulse 75   Temp 97.9 F (36.6 C)   Resp 10   Ht 1.727 m ('5\' 8"'$ )   Wt 94.5 kg   SpO2 96%   BMI 31.68 kg/m  Physical Exam Vitals and nursing note reviewed.  Constitutional:      General: She is not in acute distress.    Appearance: Normal appearance. She is well-developed.  HENT:     Head: Normocephalic and atraumatic.  Eyes:  Extraocular Movements: Extraocular movements intact.     Conjunctiva/sclera: Conjunctivae normal.     Pupils: Pupils are equal, round, and reactive to light.  Cardiovascular:     Rate and Rhythm: Normal rate and regular rhythm.     Heart sounds: No murmur heard. Pulmonary:     Effort: Pulmonary effort is normal. No respiratory distress.     Breath sounds: Normal breath sounds.  Abdominal:     Palpations: Abdomen is soft.     Tenderness: There is no abdominal tenderness.  Musculoskeletal:        General: No swelling.     Cervical back: Neck supple.   Skin:    General: Skin is warm and dry.     Capillary Refill: Capillary refill takes less than 2 seconds.  Neurological:     Mental Status: She is alert.     Comments: Left-sided weakness.  Questionable vision abnormality.  Psychiatric:        Mood and Affect: Mood normal.     ED Results / Procedures / Treatments   Labs (all labs ordered are listed, but only abnormal results are displayed) Labs Reviewed  CBC - Abnormal; Notable for the following components:      Result Value   Hemoglobin 9.7 (*)    HCT 31.0 (*)    MCV 76.2 (*)    MCH 23.8 (*)    RDW 18.5 (*)    Platelets 138 (*)    All other components within normal limits  COMPREHENSIVE METABOLIC PANEL - Abnormal; Notable for the following components:   Potassium 5.8 (*)    Glucose, Bld 147 (*)    BUN 21 (*)    Creatinine, Ser 3.47 (*)    Calcium 8.2 (*)    Albumin 3.0 (*)    GFR, Estimated 15 (*)    All other components within normal limits  CBG MONITORING, ED - Abnormal; Notable for the following components:   Glucose-Capillary 147 (*)    All other components within normal limits  I-STAT CHEM 8, ED - Abnormal; Notable for the following components:   Potassium 6.5 (*)    BUN 30 (*)    Creatinine, Ser 3.60 (*)    Glucose, Bld 147 (*)    Calcium, Ion 0.94 (*)    Hemoglobin 11.6 (*)    HCT 34.0 (*)    All other components within normal limits  ETHANOL  PROTIME-INR  APTT  DIFFERENTIAL  RAPID URINE DRUG SCREEN, HOSP PERFORMED  URINALYSIS, ROUTINE W REFLEX MICROSCOPIC  BLOOD GAS, VENOUS  POC URINE PREG, ED    EKG EKG Interpretation  Date/Time:  Friday March 04 2022 11:00:30 EST Ventricular Rate:  74 PR Interval:  204 QRS Duration: 92 QT Interval:  434 QTC Calculation: 482 R Axis:   -47 Text Interpretation: Sinus rhythm Left anterior fascicular block Abnormal R-wave progression, early transition Abnormal T, consider ischemia, lateral leads Confirmed by Fredia Sorrow [id number]) on 03/04/2022 12:51:27  PM  Radiology MR Brain Wo Contrast (neuro protocol)  Result Date: 03/04/2022 CLINICAL DATA:  Stroke suspected EXAM: MRI HEAD WITHOUT CONTRAST TECHNIQUE: Multiplanar, multiecho pulse sequences of the brain and surrounding structures were obtained without intravenous contrast. COMPARISON:  MRI head 08/25/2021, correlation is also made with CT head 03/04/2022 FINDINGS: Evaluation is limited by susceptibility artifact related to the patient's shunt hardware, which obscures the right frontal and right lateral temporal lobe, particularly affecting the diffusion-weighted and susceptibility weighted sequences. Brain: No restricted diffusion to suggest acute or subacute infarct,  although evaluation of the right frontal and temporal lobes is limited, as described above. Increased signal on diffusion-weighted imaging in the right internal capsule (series 5, image 18) is along the margin the previously noted thalamic hemorrhage and favored to be artifactual, related to susceptibility. No acute hemorrhage, mass, mass effect, or midline shift. No hydrocephalus or extra-axial collection. Hemosiderin deposition in the right thalamus, extending into the right cerebral peduncle and midbrain, consistent with sequela of the previously noted hemorrhage. Previously noted associated edema has resolved. Additional scattered foci of hemosiderin deposition appear unchanged from the prior exam. Right frontal approach ventriculostomy catheter. Grossly unchanged size and configuration of the ventricles compared to recent CTs. Vascular: Normal arterial flow voids. Skull and upper cervical spine: Right frontal burr hole. Otherwise grossly normal marrow signal. Sinuses/Orbits: Mucosal thickening in the sphenoid sinuses. Status post bilateral lens replacements. Other: The mastoids are well aerated. IMPRESSION: 1. Evaluation is limited by susceptibility artifact related to the patient's shunt hardware. Within this limitation, no acute  intracranial process. 2. Expected evolution of the previously noted right thalamic hemorrhage and encephalomalacia. 3. Right frontal approach ventriculostomy catheter without evidence of hydrocephalus. Electronically Signed   By: Merilyn Baba M.D.   On: 03/04/2022 14:35   DG Chest Port 1 View  Result Date: 03/04/2022 CLINICAL DATA:  Dialysis patient, stroke EXAM: PORTABLE CHEST 1 VIEW COMPARISON:  Portable exam 1300 hours compared to 10/27/2021 FINDINGS: Normal heart size, mediastinal contours, and pulmonary vascularity. VP shunt tubing traverses RIGHT hemithorax. Chronic accentuation of interstitial markings, reticulonodular in character, unchanged. No new infiltrate, pleural effusion, or pneumothorax. No acute osseous findings. IMPRESSION: Chronic reticulonodular interstitial changes in both lungs. No acute abnormalities. Electronically Signed   By: Lavonia Dana M.D.   On: 03/04/2022 13:16   CT ANGIO HEAD NECK W WO CM W PERF (CODE STROKE)  Result Date: 03/04/2022 CLINICAL DATA:  56 year old female code stroke presentation with rightward gaze, weakness. History of right thalamic hemorrhage, VP shunt. EXAM: CT ANGIOGRAPHY HEAD AND NECK CT PERFUSION BRAIN TECHNIQUE: Multidetector CT imaging of the head and neck was performed using the standard protocol during bolus administration of intravenous contrast. Multiplanar CT image reconstructions and MIPs were obtained to evaluate the vascular anatomy. Carotid stenosis measurements (when applicable) are obtained utilizing NASCET criteria, using the distal internal carotid diameter as the denominator. Multiphase CT imaging of the brain was performed following IV bolus contrast injection. Subsequent parametric perfusion maps were calculated using RAPID software. RADIATION DOSE REDUCTION: This exam was performed according to the departmental dose-optimization program which includes automated exposure control, adjustment of the mA and/or kV according to patient size  and/or use of iterative reconstruction technique. CONTRAST:  116m OMNIPAQUE IOHEXOL 350 MG/ML SOLN COMPARISON:  Plain head CT 1012 hours today. FINDINGS: CT Brain Perfusion Findings: ASPECTS: 10 CBF (<30%) Volume: 055m No CBF parameter abnormality. Minimal CBV abnormality right parietal lobe. Perfusion (Tmax>6.0s) volume: 10654malthough some of this abnormality is bilateral and seems not to be in a vascular territory pattern. Mismatch Volume: Possibly 106m34might hemisphere Infarction Location:No infarct core detected CTA NECK Skeleton: Right VP shunt as described on the head CT today. Still some gas in the shunt reservoir at the right vertex. No acute osseous abnormality identified. Upper chest: Patchy widespread sub solid peribronchial pulmonary opacity in the visible lungs, nonspecific. Furthermore, this appears very similar to the visible lung bases on 09/16/2021 CT Abdomen and Pelvis, and abnormal interstitial markings were evident on portable chest x-ray 10/27/2021. Visible major airways appear  clear. Negative visible superior mediastinum. Other neck: Right neck shunt tubing with no adverse features, continues into the anterior chest. Aortic arch: Mild Calcified aortic atherosclerosis. 3 vessel arch configuration. Right carotid system: Mildly tortuous brachiocephalic artery without plaque or stenosis. Negative right CCA. Minimal calcified plaque at the right carotid bifurcation, no stenosis to the skull base. Left carotid system: Mild tortuosity. Mild calcified plaque at the posterior left ICA origin. No significant stenosis. Vertebral arteries: Minimal proximal right subclavian artery and right vertebral artery origin calcified plaque (series 8, image 274) with no stenosis. Codominant appearing right vertebral artery is patent and within normal limits to the skull base. Mild proximal left subclavian and left vertebral artery plaque without stenosis. Tortuous left V1 segment. Codominant left vertebral  artery appears patent and within normal limits to the skull base. CTA HEAD Posterior circulation: Distal vertebral arteries are mildly irregular, patent to the vertebrobasilar junction with some calcified plaque but no significant stenosis. Both PICA origins are patent. Patent basilar artery without stenosis. Patent SCA and PCA origins. Posterior communicating arteries are diminutive or absent. Bilateral PCA branches are within normal limits. Anterior circulation: Both ICA siphons are patent although heavily calcified through the proximal supraclinoid segments. There is mild to moderate pre cavernous stenosis on the left. Mild left supraclinoid stenosis. On the right there is mild to moderate cavernous/anterior genu stenosis. But patent carotid termini, MCA and ACA origins. Mildly dominant left ACA A1. Normal anterior communicating artery. Bilateral ACA branches are normal. Left MCA M1 segment and trifurcation are patent without stenosis. Right MCA M1 and bifurcation are patent without stenosis. Bilateral MCA branches are within normal limits. Venous sinuses: Early contrast timing, grossly patent. Anatomic variants: Mildly dominant left ACA A1. Review of the MIP images confirms the above findings IMPRESSION: 1. Abnormal Tmax on CTP, although somewhat bilateral (right > left) and seems artifactual. No infarct core detected. 2. CTA is negative for large vessel occlusion. And although positive for abundant ICA Siphon calcified plaque, only mild right siphon stenosis results, with up to moderate siphon stenosis on the left (discordant with CTP). 3. Abnormal lung apices, although similar in appearance to the lung bases on CT Abdomen and Pelvis earlier this year. Suspect chronic lung disease rather than acute infection/inflammation. 4.  Aortic Atherosclerosis (ICD10-I70.0). Study discussed by telephone with Dr. Roland Rack on 03/04/2022 at 11:00 . Electronically Signed   By: Genevie Ann M.D.   On: 03/04/2022 11:02    CT HEAD CODE STROKE WO CONTRAST  Result Date: 03/04/2022 CLINICAL DATA:  Code stroke. 56 year old female with new onset right side weakness and abnormal gaze. History of right thalamic hemorrhage in May this year. EXAM: CT HEAD WITHOUT CONTRAST TECHNIQUE: Contiguous axial images were obtained from the base of the skull through the vertex without intravenous contrast. RADIATION DOSE REDUCTION: This exam was performed according to the departmental dose-optimization program which includes automated exposure control, adjustment of the mA and/or kV according to patient size and/or use of iterative reconstruction technique. COMPARISON:  Brain MRI 08/25/2021.  Head CT 10/27/2021. FINDINGS: Brain: Stable right superior approach ventriculostomy which traverses the septum pellucidum and terminates at the left lateral ventricle. No significant ventriculomegaly, although ventricle size does appear slightly larger since August. No pneumo ventricle or pneumocephalus. No midline shift, mass effect, or evidence of intracranial mass lesion. Right thalamic encephalomalacia with expected evolution. No acute intracranial hemorrhage identified. No cortically based acute infarct identified. Stable gray-white matter differentiation throughout the brain. No transependymal edema suspected. Vascular: Extensive  Calcified atherosclerosis at the skull base. No suspicious intracranial vascular hyperdensity. Skull: Stable superior right frontal burr hole for CSF shunt. Sinuses/Orbits: Stable paranasal sinuses, sphenoid opacifications. Tympanic cavities and mastoids appear to remain well aerated. Other: Right vertex shunt reservoir and tubing appear stable except for some gas now visible within the reservoir on series 4, image 28, significance unclear. Extensive calcified scalp vessel atherosclerosis. Stable orbits. ASPECTS [employer] Stroke Program Early CT Score) Total score (0-10 with 10 being normal): 10 IMPRESSION: 1. No acute cortically  based infarct or acute intracranial hemorrhage identified. ASPECTS 10. 2. Expected evolution of right thalamic encephalomalacia, and stable VP shunt configuration - although the ventricle size has mildly increased since August and unusual appearance of visible gas now in the shunt reservoir. Query recent shunt access versus shunt malfunction. 3. These results were communicated to Dr. Leonel Ramsay at 10:21 am on 03/04/2022 by text page via the [facility] messaging system. Electronically Signed   By: Genevie Ann M.D.   On: 03/04/2022 10:22    Procedures Procedures    Medications Ordered in ED Medications  sodium zirconium cyclosilicate (LOKELMA) packet 10 g (10 g Oral Given 03/04/22 1308)  iohexol (OMNIPAQUE) 350 MG/ML injection 100 mL (100 mLs Intravenous Contrast Given 03/04/22 1021)  HYDROmorphone (DILAUDID) injection 1 mg (1 mg Intravenous Given 03/04/22 1309)  diphenhydrAMINE (BENADRYL) injection 25 mg (25 mg Intravenous Given 03/04/22 1309)  metoCLOPramide (REGLAN) injection 10 mg (10 mg Intravenous Given 03/04/22 1309)    ED Course/ Medical Decision Making/ A&P Clinical Course as of 03/04/22 1814  Fri Mar 04, 2022  1803 Called to patient's bedside as she had an unresponsive episode satting 30s on room air with good Plath.  Patient was placed on nonrebreather with improvement.  She is now satting 90s on room air with no increased work of breathing breath sounds are clear.  Suspect likely secondary to medications administered earlier today for migraine with Dilaudid Reglan and Benadryl and poss associated OSA.  She had received Lokelma for hyperkalemia 5.8.  She had finished half her session for dialysis today. Ordered rpt CXR andVBG. [VB]    Clinical Course User Index [VB] Elgie Congo, MD                           Medical Decision Making Amount and/or Complexity of Data Reviewed Labs: ordered. Radiology: ordered.  Risk Prescription drug management.   CRITICAL CARE Performed by: Fredia Sorrow Total critical care time: 60 minutes Critical care time was exclusive of separately billable procedures and treating other patients. Critical care was necessary to treat or prevent imminent or life-threatening deterioration. Critical care was time spent personally by me on the following activities: development of treatment plan with patient and/or surrogate as well as nursing, discussions with consultants, evaluation of patient's response to treatment, examination of patient, obtaining history from patient or surrogate, ordering and performing treatments and interventions, ordering and review of laboratory studies, ordering and review of radiographic studies, pulse oximetry and re-evaluation of patient's condition.  Code stroke activated.  Patient seen by Dr. Leonel Ramsay.  Head CT without acute findings so he decided arrival question because she VP shunt would interfere with MRI.  Also there was some question there was some air at the base of the shunt inserted.  We discussed that with neurosurgery symptoms were all normal.  MRI would be fine.   Treated for concerns of may be a migraine patient received IV Reglan and  Benadryl Dilaudid 1.  Patient had significant improvement.  Completely resolved.  MRI was done MRI head no acute therapy discussed with Dr. Saralyn Pilar.  Feels that everything is baseline in the probably everything was related to this migraine headache.  Patient was 2723 T labs significant on i-STAT for potassium 6.5 but it was 5.8 on her complete metabolic panel.  Unit reviewed and CBC normal no white blood cell count hemoglobin good at 9.7 White blood cell count was 4.1.  Complete metabolic panel significant for potassium of 5.8, GFR 15 sodium normal at 134 glucose 147.  Because of the elevated potassium and while we are doing the workup patient received Lokelma 7 mg twice.  Chest x-ray had no significant findings just chronic interstitial fibrosis with no evidence of any  florid pulmonary edema.  Patient's oxygen sats were good.  Stable for discharge home.  Patient was fine with that was feeling much better was relieved that there was no evidence of any new stroke.   Final Clinical Impression(s) / ED Diagnoses Final diagnoses:  Other migraine without status migrainosus, not intractable  ESRD on dialysis [facility])    Rx / DC Orders ED Discharge Orders     None         Fredia Sorrow, MD 03/04/22 1814

## 2022-03-04 NOTE — ED Provider Notes (Signed)
  Physical Exam  BP (!) 195/95   Pulse 72   Temp 98.1 F (36.7 C) (Oral)   Resp 10   Ht '5\' 8"'$  (1.727 m)   Wt 94.5 kg   SpO2 98%   BMI 31.68 kg/m   Physical Exam  Procedures  Procedures  ED Course / MDM   Clinical Course as of 03/04/22 2318  Fri Mar 04, 2022  1803 Called to patient's bedside as she had an episode satting 30s on room air with good Pleth. However woken up with stimulation  Patient was placed on nonrebreather with improvement.  She is now satting 90s on room air with no increased work of breathing breath sounds are clear.  Suspect likely secondary to medications administered earlier today for migraine with Dilaudid Reglan and Benadryl and poss associated OSA.  She had received Lokelma for hyperkalemia 5.8.  She had finished half her session for dialysis today. Ordered rpt CXR andVBG. [VB]  1855 Repeat chest x-ray unchanged and VBG reassuring with no hypercarbia pCO2 49.  pH 7.46. [VB]  2242 Patient observed for another 5 hours without further desat safe for discharge and follow up dialysis.  Of note she was discharged hypertensive.  However without symptoms of hypertensive emergency.  Husband was called, patient is baseline mental impairment from previous strokes and bedbound.  She will need dialysis tomorrow to complete. [VB]    Clinical Course User Index [VB] Elgie Congo, MD   Medical Decision Making Amount and/or Complexity of Data Reviewed Labs: ordered. Radiology: ordered.  Risk Prescription drug management.          Elgie Congo, MD 03/04/22 [phone]

## 2022-03-08 ENCOUNTER — Other Ambulatory Visit: Payer: Self-pay | Admitting: Internal Medicine

## 2022-03-08 ENCOUNTER — Telehealth: Payer: Self-pay | Admitting: Internal Medicine

## 2022-03-08 NOTE — Telephone Encounter (Signed)
Malori advised

## 2022-03-08 NOTE — Telephone Encounter (Signed)
Malori, OT with [facility], [phone]   Wants to get verbal orders for OT extension for 1 visit week on 03/20/22-03/26/22?

## 2022-03-09 ENCOUNTER — Other Ambulatory Visit: Payer: Self-pay

## 2022-03-09 ENCOUNTER — Other Ambulatory Visit: Payer: Self-pay | Admitting: Internal Medicine

## 2022-03-11 ENCOUNTER — Telehealth: Payer: Self-pay | Admitting: Internal Medicine

## 2022-03-11 NOTE — Telephone Encounter (Signed)
Kecia, [place], [phone]   Wants to know if order can be placed for an electric hospital bed & [facility] lift?

## 2022-03-14 NOTE — Telephone Encounter (Signed)
Scheduled 12/19

## 2022-03-15 ENCOUNTER — Ambulatory Visit: Payer: 59 | Admitting: Internal Medicine

## 2022-03-18 ENCOUNTER — Telehealth: Payer: Self-pay | Admitting: Internal Medicine

## 2022-03-18 NOTE — Telephone Encounter (Signed)
Joey, physical therapist with [facility] called in on patient behalf.   Was unable to do visit with patient this week, wants verbal for next Tuesday visit .   Call back [phone]

## 2022-03-18 NOTE — Telephone Encounter (Signed)
Gave orders.

## 2022-03-24 ENCOUNTER — Telehealth: Payer: Self-pay | Admitting: Internal Medicine

## 2022-03-24 NOTE — Telephone Encounter (Signed)
Malorie Occupational therapist with [facility] called in on patient behalf.  Needing verbal orders for OT   1 week 4   Call back [phone]

## 2022-03-25 ENCOUNTER — Telehealth: Payer: Self-pay | Admitting: Internal Medicine

## 2022-03-25 NOTE — Telephone Encounter (Signed)
Malorie from [facility] advised

## 2022-03-25 NOTE — Telephone Encounter (Signed)
Loraine w. [facility] called in on patient behalf. Requesting verbal orders to recert  nursing    1 week 3   Disease control / education      Call back [phone]

## 2022-03-25 NOTE — Telephone Encounter (Signed)
Left message for Janet Mitchell

## 2022-04-12 ENCOUNTER — Other Ambulatory Visit: Payer: Self-pay | Admitting: Internal Medicine

## 2022-05-03 ENCOUNTER — Other Ambulatory Visit: Payer: Self-pay | Admitting: Internal Medicine

## 2022-05-06 ENCOUNTER — Telehealth: Payer: Self-pay | Admitting: Internal Medicine

## 2022-05-06 NOTE — Telephone Encounter (Signed)
Cap forms  Copied Noted sleeved

## 2022-05-09 DIAGNOSIS — Z0279 Encounter for issue of other medical certificate: Secondary | ICD-10-CM

## 2022-05-12 NOTE — Telephone Encounter (Signed)
Called [phone] and left voicemail to pick up forms

## 2022-05-13 NOTE — Telephone Encounter (Signed)
Forms picked up has never paid for forms before.

## 2022-05-16 ENCOUNTER — Other Ambulatory Visit: Payer: Self-pay | Admitting: Internal Medicine

## 2022-05-25 LAB — HEMOGLOBIN A1C: Hemoglobin A1C: 5.7

## 2022-05-27 ENCOUNTER — Telehealth: Payer: Self-pay | Admitting: Internal Medicine

## 2022-05-27 NOTE — Telephone Encounter (Signed)
Patient dropped off document  [employer] Assistance for [facility] , to be filled out by provider. Patient requested to send it via Fax within ASAP. Document is located in providers tray at front office.    Noted/copied/sleeved & give to provider box

## 2022-05-31 ENCOUNTER — Encounter: Payer: Self-pay | Admitting: Internal Medicine

## 2022-05-31 ENCOUNTER — Ambulatory Visit (INDEPENDENT_AMBULATORY_CARE_PROVIDER_SITE_OTHER): Payer: 59 | Admitting: Internal Medicine

## 2022-05-31 VITALS — BP 138/86 | HR 73 | Ht 68.0 in

## 2022-05-31 DIAGNOSIS — J209 Acute bronchitis, unspecified: Secondary | ICD-10-CM

## 2022-05-31 DIAGNOSIS — F5101 Primary insomnia: Secondary | ICD-10-CM

## 2022-05-31 DIAGNOSIS — E1122 Type 2 diabetes mellitus with diabetic chronic kidney disease: Secondary | ICD-10-CM | POA: Diagnosis not present

## 2022-05-31 DIAGNOSIS — I10 Essential (primary) hypertension: Secondary | ICD-10-CM | POA: Diagnosis not present

## 2022-05-31 DIAGNOSIS — Z993 Dependence on wheelchair: Secondary | ICD-10-CM

## 2022-05-31 DIAGNOSIS — N186 End stage renal disease: Secondary | ICD-10-CM

## 2022-05-31 DIAGNOSIS — Z992 Dependence on renal dialysis: Secondary | ICD-10-CM

## 2022-05-31 DIAGNOSIS — Z0279 Encounter for issue of other medical certificate: Secondary | ICD-10-CM

## 2022-05-31 DIAGNOSIS — Z794 Long term (current) use of insulin: Secondary | ICD-10-CM

## 2022-05-31 DIAGNOSIS — G8194 Hemiplegia, unspecified affecting left nondominant side: Secondary | ICD-10-CM

## 2022-05-31 MED ORDER — TRAZODONE HCL 50 MG PO TABS: 25.0000 mg | ORAL_TABLET | Freq: Every evening | ORAL | 3 refills | Status: DC | PRN

## 2022-05-31 MED ORDER — CHERATUSSIN AC 100-10 MG/5ML PO SOLN: 5.0000 mL | Freq: Three times a day (TID) | ORAL | 0 refills | Status: DC | PRN

## 2022-05-31 MED ORDER — AMLODIPINE BESYLATE 2.5 MG PO TABS: 2.5000 mg | ORAL_TABLET | Freq: Two times a day (BID) | ORAL | 1 refills | Status: DC

## 2022-05-31 MED ORDER — ALBUTEROL SULFATE HFA 108 (90 BASE) MCG/ACT IN AERS: 2.0000 | INHALATION_SPRAY | Freq: Four times a day (QID) | RESPIRATORY_TRACT | 2 refills | Status: DC | PRN

## 2022-05-31 MED ORDER — AZITHROMYCIN 250 MG PO TABS: ORAL_TABLET | ORAL | 0 refills | Status: AC

## 2022-05-31 NOTE — Assessment & Plan Note (Addendum)
BP Readings from Last 1 Encounters:  05/31/22 138/86   Well-controlled with amlodipine and carvedilol Counseled for compliance with the medications Advised to follow renal diet

## 2022-05-31 NOTE — Assessment & Plan Note (Signed)
Due to right-sided thalamic hemorrhage Currently wheelchair-bound Referred to home health for PT and home safety evaluation

## 2022-05-31 NOTE — Progress Notes (Unsigned)
Established Patient Office Visit  Subjective:  Patient ID: Janet Mitchell, female    DOB: Feb 20, 1966  Age: 57 y.o. MRN: [id number]  CC:  Chief Complaint  Patient presents with   Cough    Patient has a cough, cold symptoms, wheezing. Follow up for ESRD and CVA    HPI Janet Mitchell is a 58 y.o. female with past medical history of HTN, DM with ESRD on HD and intracranial hemorrhage who presents for f/u of her chronic medical conditions.  She had intracranial hemorrhage in 05/23.  She was admitted at [facility] for right-sided thalamic hemorrhage, for which she had external ventricular shunt placement by neurosurgery.  She had trach and PEG tube placed, and was discharged to SNF on 10/05/21.  She was recently discharged to home in the last week.  She still has a PEG tube, but has been tolerating p.o. intake currently.  She is currently taking amantadine prescribed by neurosurgery.  She has residual left-sided hemiplegia and is wheelchair-bound currently.  She is planned to get home PT.  She is currently on HD for ESRD.  Her BP is well controlled currently.  Her blood glucose has been well controlled without needing insulin.  Past Medical History:  Diagnosis Date   Abdominal wall hematoma 12/26/2019   Anemia    Arthritis    CHF (congestive heart failure) ([place])    pt states she has been cleared of heart failure/disease   Chronic cholecystitis with calculus    COVID-19 03/26/2019   COVID-19 virus infection 03/2019   ESRD (end stage renal disease) on dialysis [facility])    Dialysis M-W-F   Headache    "a few/wk" (03/09/2018) - no longer having these   History of blood transfusion 10/2017   "low blood count" (03/09/2018)   Hypertension    Respiratory failure requiring intubation (HCC)    Seasonal allergies    Spinal headache    Type II diabetes mellitus ([place])     Past Surgical History:  Procedure Laterality Date   AMPUTATION TOE Left 2013   Great toe   AV FISTULA  PLACEMENT Left 07/22/2019   Procedure: LEFT ARM BASILIC ARTERIOVENOUS (AV) FISTULA CREATION;  Surgeon: Rosetta Posner, MD;  Location: [place];  Service: Vascular;  Laterality: Left;   [place] Left 10/17/2019   Procedure: LEFT SECOND STAGE [place];  Surgeon: Rosetta Posner, MD;  Location: [place];  Service: Vascular;  Laterality: Left;   BIOPSY  10/24/2018   Procedure: BIOPSY;  Surgeon: Daneil Dolin, MD;  [facility];  Service: Endoscopy;;  right and left colon   CATARACT EXTRACTION W/ INTRAOCULAR LENS IMPLANT Right    [place]; [place] N/A 03/09/2018   Procedure: LAPAROSCOPIC CHOLECYSTECTOMY WITH INTRAOPERATIVE CHOLANGIOGRAM ERAS PATHWAY;  Surgeon: Donnie Mesa, MD;  Location: [place];  Service: General;  Laterality: N/A;   COLONOSCOPY N/A 10/24/2018   Procedure: COLONOSCOPY;  Surgeon: Daneil Dolin, MD;  [facility];  Service: Endoscopy;  Laterality: N/A;  1:00pm   EYE SURGERY Left 05/15/2018   Removal of blood in the globe (due to DM)   FLEXIBLE SIGMOIDOSCOPY N/A 11/24/2017   Procedure: FLEXIBLE SIGMOIDOSCOPY;  Surgeon: Daneil Dolin, MD;  [facility];  Service: Endoscopy;  Laterality: N/A;   IR FLUORO GUIDE CV LINE RIGHT  07/17/2019   IR GASTROSTOMY TUBE MOD SED  09/16/2021   IR PARACENTESIS  07/09/2019  IR US GUIDE VASC ACCESS RIGHT  07/17/2019   LAPAROSCOPIC CHOLECYSTECTOMY  03/09/2018   LAPAROSCOPIC REVISION VENTRICULAR-PERITONEAL (V-P) SHUNT  09/08/2021   Procedure: LAPAROSCOPIC ASSISTANCE FOR  VENTRICULAR-PERITONEAL (V-P) SHUNT PLACEMENT ;  Surgeon: Vallarie Mare, MD;  [facility];  Service: Neurosurgery;;   PERITONEAL CATHETER INSERTION  2017   POLYPECTOMY  10/24/2018   Procedure: POLYPECTOMY;  Surgeon: Daneil Dolin, MD;  [facility];  Service: Endoscopy;;   TOTAL HIP ARTHROPLASTY Left 1997   VENTRICULOPERITONEAL SHUNT N/A 09/08/2021   Procedure: SHUNT INSERTION  VENTRICULAR-PERITONEAL;  Surgeon: Vallarie Mare, MD;  Location: [place];  Service: Neurosurgery;  Laterality: N/A;    Family History  Problem Relation Age of Onset   Heart disease Mother    Thrombocytopenia Mother        TTP   Heart failure Father    Kidney disease Paternal Grandfather    Colon cancer Neg Hx     Social History   Socioeconomic History   Marital status: Married    Spouse name: Not on file   Number of children: 2   Years of education: Not on file   Highest education level: Not on file  Occupational History   Not on file  Tobacco Use   Smoking status: Never    Passive exposure: Yes   Smokeless tobacco: Never  Vaping Use   Vaping Use: Never used  Substance and Sexual Activity   Alcohol use: Never   Drug use: Never   Sexual activity: Yes    Birth control/protection: I.U.D.    Comment: Mirena IUD  Other Topics Concern   Not on file  Social History Narrative   Not on file   Social Determinants of Health   Financial Resource Strain: Low Risk  (11/30/2021)   Overall Financial Resource Strain (CARDIA)    Difficulty of Paying Living Expenses: Not hard at all  Food Insecurity: No Food Insecurity (11/30/2021)   Hunger Vital Sign    Worried About Running Out of Food in the Last Year: Never true    Ran Out of Food in the Last Year: Never true  Transportation Needs: No Transportation Needs (11/30/2021)   PRAPARE - Hydrologist (Medical): No    Lack of Transportation (Non-Medical): No  Physical Activity: Sufficiently Active (11/30/2021)   Exercise Vital Sign    Days of Exercise per Week: 5 days    Minutes of Exercise per Session: 30 min  Stress: No Stress Concern Present (11/30/2021)   [place]    Feeling of Stress : Not at all  Social Connections: [place] (11/30/2021)   Social Connection and Isolation Panel [NHANES]    Frequency of Communication with Friends  and Family: More than three times a week    Frequency of Social Gatherings with Friends and Family: More than three times a week    Attends Religious Services: More than 4 times per year    Active Member of [employer] or Organizations: Yes    Attends Archivist Meetings: More than 4 times per year    Marital Status: Married  Human resources officer Violence: Not At Risk (11/30/2021)   Humiliation, Afraid, Rape, and Kick questionnaire    Fear of Current or Ex-Partner: No    Emotionally Abused: No    Physically Abused: No    Sexually Abused: No    Outpatient Medications Prior to Visit  Medication Sig Dispense Refill  amantadine (SYMMETREL) 50 MG/5ML solution Take 100 mg by mouth 2 (two) times daily. (Patient not taking: Reported on 03/02/2022)     amLODipine (NORVASC) 2.5 MG tablet TAKE 2 TABLETS BY MOUTH ONCE DAILY. 60 tablet 0   AMLODIPINE BENZOATE PO Take 2.5 mg by mouth daily.     B Complex-C-Folic Acid (RENA-VITE PO) Take 1 tablet by mouth daily.     calcitRIOL (ROCALTROL) 0.5 MCG capsule TAKE 2 CAPSULES BY MOUTH EVERY MONDAY, WEDNESDAY, AND FRIDAY FOR KIDNEY DISEASE. 30 capsule 2   carvedilol (COREG) 6.25 MG tablet TAKE (1) TABLET BY MOUTH TWICE DAILY. 60 tablet 0   cetirizine (ZYRTEC) 10 MG tablet TAKE 1 TABLET BY MOUTH ONCE DAILY. 30 tablet 0   multivitamin (RENA-VIT) TABS tablet TAKE (1) TABLET BY MOUTH ONCE DAILY FOR KIDNEY DISEASE. 30 tablet 2   pantoprazole (PROTONIX) 40 MG tablet Take 1 tablet (40 mg total) by mouth daily. 90 tablet 3   scopolamine (TRANSDERM-SCOP) 1 MG/3DAYS Place 1 patch (1.5 mg total) onto the skin every 3 (three) days. 10 patch 0   traMADol (ULTRAM) 50 MG tablet Take 50 mg by mouth 2 (two) times daily as needed.     No facility-administered medications prior to visit.    No Known Allergies  ROS Review of Systems  Constitutional:  Negative for chills and fever.  HENT:  Negative for congestion, sinus pressure and sinus pain.   Eyes:  Negative for pain  and discharge.  Respiratory:  Negative for cough and shortness of breath.   Cardiovascular:  Negative for chest pain and palpitations.  Gastrointestinal:  Negative for diarrhea, nausea and vomiting.  Genitourinary:  Negative for dysuria and hematuria.  Musculoskeletal:  Negative for neck pain and neck stiffness.  Skin:  Negative for rash.  Neurological:  Positive for weakness and numbness.  Psychiatric/Behavioral:  Negative for agitation and behavioral problems.       Objective:    Physical Exam Constitutional:      General: She is not in acute distress.    Appearance: She is obese. She is not diaphoretic.  HENT:     Head: Normocephalic.     Nose: No congestion.     Mouth/Throat:     Mouth: Mucous membranes are moist.     Pharynx: No posterior oropharyngeal erythema.  Eyes:     General: No scleral icterus.    Pupils: Pupils are equal, round, and reactive to light.  Cardiovascular:     Rate and Rhythm: Normal rate and regular rhythm.     Heart sounds: Normal heart sounds. No murmur heard. Pulmonary:     Breath sounds: Normal breath sounds. No wheezing or rales.  Abdominal:     Palpations: Abdomen is soft.     Tenderness: There is no abdominal tenderness.     Comments: PEG tube in place  Musculoskeletal:     Cervical back: Neck supple. No tenderness.     Right lower leg: No edema.     Left lower leg: No edema.  Skin:    General: Skin is warm.     Findings: No rash.  Neurological:     Mental Status: She is alert and oriented to person, place, and time.     Sensory: Sensory deficit (Left UE and LE) present.     Motor: Weakness (Left UE and LE - 1/5, right UE and LE - 4/5) present.  Psychiatric:        Mood and Affect: Mood normal.  Behavior: Behavior normal.     BP (!) 145/84 (BP Location: Right Arm, Patient Position: Sitting, Cuff Size: Large)   Pulse 73   Ht '5\' 8"'$  (1.727 m)   SpO2 93%   BMI 31.68 kg/m  Wt Readings from Last 3 Encounters:  03/04/22 208  lb 5.4 oz (94.5 kg)  10/27/21 210 lb (95.3 kg)  10/05/21 212 lb 11.9 oz (96.5 kg)    Lab Results  Component Value Date   TSH 1.265 08/24/2021   Lab Results  Component Value Date   WBC 4.1 03/04/2022   HGB 11.6 (L) 03/04/2022   HCT 34.0 (L) 03/04/2022   MCV 76.2 (L) 03/04/2022   PLT 138 (L) 03/04/2022   Lab Results  Component Value Date   NA 135 03/04/2022   K 6.5 (HH) 03/04/2022   CO2 28 03/04/2022   GLUCOSE 147 (H) 03/04/2022   BUN 30 (H) 03/04/2022   CREATININE 3.60 (H) 03/04/2022   BILITOT 0.8 03/04/2022   ALKPHOS 68 03/04/2022   AST 36 03/04/2022   ALT 16 03/04/2022   PROT 6.7 03/04/2022   ALBUMIN 3.0 (L) 03/04/2022   CALCIUM 8.2 (L) 03/04/2022   ANIONGAP 9 03/04/2022   Lab Results  Component Value Date   CHOL 178 08/23/2021   Lab Results  Component Value Date   HDL 63 08/23/2021   Lab Results  Component Value Date   LDLCALC 38 08/23/2021   Lab Results  Component Value Date   TRIG 174 (H) 08/28/2021   Lab Results  Component Value Date   CHOLHDL 2.8 08/23/2021   Lab Results  Component Value Date   HGBA1C 6.2 (H) 08/23/2021      Assessment & Plan:   Problem List Items Addressed This Visit   None  No orders of the defined types were placed in this encounter.   Follow-up: No follow-ups on file.    Lindell Spar, MD

## 2022-05-31 NOTE — Patient Instructions (Signed)
Please start taking Azithromycin as prescribed.  Please take Cheratussin as needed for cough.  Please start taking Trazodone for insomnia.  Please continue to follow low salt diet and ambulate as tolerated.

## 2022-06-01 NOTE — Telephone Encounter (Signed)
Called patient left voicemail that the forms were faxed to [phone] and patient can pick up a copy at front desk.

## 2022-06-02 ENCOUNTER — Encounter: Payer: Self-pay | Admitting: Internal Medicine

## 2022-06-02 DIAGNOSIS — J209 Acute bronchitis, unspecified: Secondary | ICD-10-CM | POA: Insufficient documentation

## 2022-06-02 DIAGNOSIS — F5101 Primary insomnia: Secondary | ICD-10-CM | POA: Insufficient documentation

## 2022-06-02 NOTE — Assessment & Plan Note (Signed)
Has cough and wheezing, likely acute bronchitis Started empiric azithromycin Romycin Avoid oral steroids due to DM Albuterol inhaler as needed for dyspnea/wheezing

## 2022-06-02 NOTE — Assessment & Plan Note (Signed)
Followed by Nephrology On HD MWF

## 2022-06-02 NOTE — Assessment & Plan Note (Signed)
Started Trazodone as needed Sleep hygiene discussed

## 2022-06-02 NOTE — Assessment & Plan Note (Signed)
Due to left sided hemiplegia from CVA/intracranial hemorrhage S/p tracheostomy and PEG tube, now removed Dependent for ADLs Has had home PT, would benefit from additional home PT/OT PCS forms filled out today

## 2022-06-02 NOTE — Assessment & Plan Note (Signed)
Lab Results  Component Value Date   HGBA1C 5.7 05/25/2022   Well-controlled Has not been on [place] now Follows up with Nephrologist for ESRD on HD On kidney transplant list at [facility]

## 2022-06-07 ENCOUNTER — Other Ambulatory Visit: Payer: Self-pay | Admitting: Internal Medicine

## 2022-06-08 ENCOUNTER — Telehealth: Payer: Self-pay | Admitting: Internal Medicine

## 2022-06-08 NOTE — Telephone Encounter (Signed)
Robinette Haines called from [facility]. Per patient will do physical therapy starting 03.14.2024.  Aaron Edelman call back # if needed: [phone]

## 2022-06-10 ENCOUNTER — Telehealth: Payer: Self-pay | Admitting: Internal Medicine

## 2022-06-10 NOTE — Telephone Encounter (Signed)
Joe physical therapist w. [facility] called in on patient behalf.  Verbal order for plan of care  1 week 3  1 every 2 weeks 6   Positioning  bed mobility  Pressure relief    Call back [phone]

## 2022-06-13 NOTE — Telephone Encounter (Signed)
Joe from [place] advised

## 2022-06-19 ENCOUNTER — Other Ambulatory Visit: Payer: Self-pay | Admitting: Internal Medicine

## 2022-07-05 ENCOUNTER — Telehealth: Payer: Self-pay | Admitting: Internal Medicine

## 2022-07-05 NOTE — Telephone Encounter (Signed)
Left message for Janet Mitchell

## 2022-07-05 NOTE — Telephone Encounter (Signed)
Janet Mitchell from [facility] home [facility] called needs request OT once a week  for 5 weeks.   Call back # [phone]

## 2022-07-11 ENCOUNTER — Other Ambulatory Visit: Payer: Self-pay | Admitting: Internal Medicine

## 2022-07-15 ENCOUNTER — Other Ambulatory Visit (HOSPITAL_COMMUNITY)
Admission: RE | Admit: 2022-07-15 | Discharge: 2022-07-15 | Disposition: A | Discharge status: Home / Self Care | Payer: 59 | Source: Ambulatory Visit | Attending: Nurse Practitioner | Admitting: Nurse Practitioner

## 2022-07-15 DIAGNOSIS — D649 Anemia, unspecified: Secondary | ICD-10-CM | POA: Insufficient documentation

## 2022-07-15 LAB — OCCULT BLOOD X 1 CARD TO LAB, STOOL
Fecal Occult Bld: NEGATIVE
Fecal Occult Bld: NEGATIVE
Fecal Occult Bld: NEGATIVE

## 2022-07-20 ENCOUNTER — Other Ambulatory Visit: Payer: Self-pay | Admitting: Internal Medicine

## 2022-08-11 ENCOUNTER — Other Ambulatory Visit: Payer: Self-pay | Admitting: Internal Medicine

## 2022-08-12 ENCOUNTER — Other Ambulatory Visit (HOSPITAL_COMMUNITY): Payer: Self-pay | Admitting: Occupational Therapy

## 2022-08-12 DIAGNOSIS — R638 Other symptoms and signs concerning food and fluid intake: Secondary | ICD-10-CM

## 2022-08-12 DIAGNOSIS — R1312 Dysphagia, oropharyngeal phase: Secondary | ICD-10-CM

## 2022-08-20 ENCOUNTER — Other Ambulatory Visit: Payer: Self-pay | Admitting: Internal Medicine

## 2022-08-26 ENCOUNTER — Other Ambulatory Visit: Payer: Self-pay | Admitting: Internal Medicine

## 2022-08-31 NOTE — Progress Notes (Unsigned)
Referring Provider: Anabel Halon, MD Primary Care Physician:  Anabel Halon, MD Primary GI Physician: Dr. Jena Gauss   No chief complaint on file.   HPI:   Janet Mitchell is a 57 y.o. female presenting today for 6 month follow-up.   She has history of intracranial hemorrhage s/p ventricular shunt placement May 2023.  Required trach and PEG tube placement, subsequent at skilled nursing facility for several months.  PEG tube later removed November 2023 accidentally, but patient tolerating p.o. well.  Also with history of heart failure with preserved ejection fraction, HTN, diabetes, end-stage renal disease on dialysis, GERD, chronic intermittent diarrhea, 15 mm tubular adenoma in 2020 overdue for surveillance colonoscopy, presenting today for follow-up and to discuss scheduling surveillance colonoscopy.  Last seen in the office 03/02/2022.  Reported acid reflux taking pantoprazole 20 mg daily.  No other significant GI symptoms.  Recommended increasing Protonix to 40 mg daily and following up in 6 months to discuss scheduling colonoscopy considering health problems experienced earlier in the year including intracranial hemorrhage.    Past Medical History:  Diagnosis Date   Abdominal wall hematoma 12/26/2019   Anemia    Arthritis    CHF (congestive heart failure) (HCC)    pt states she has been cleared of heart failure/disease   Chronic cholecystitis with calculus    COVID-19 03/26/2019   COVID-19 virus infection 03/2019   ESRD (end stage renal disease) on dialysis [facility])    Dialysis M-W-F   Headache    "a few/wk" (03/09/2018) - no longer having these   History of blood transfusion 10/2017   "low blood count" (03/09/2018)   Hypertension    Respiratory failure requiring intubation (HCC)    Seasonal allergies    Spinal headache    Status post insertion of percutaneous endoscopic gastrostomy (PEG) tube (HCC) 01/26/2022   Type II diabetes mellitus (HCC)     Past Surgical  History:  Procedure Laterality Date   AMPUTATION TOE Left 2013   Great toe   AV FISTULA PLACEMENT Left 07/22/2019   Procedure: LEFT ARM BASILIC ARTERIOVENOUS (AV) FISTULA CREATION;  Surgeon: Larina Earthly, MD;  [facility];  Service: Vascular;  Laterality: Left;   BASCILIC VEIN TRANSPOSITION Left 10/17/2019   Procedure: LEFT SECOND STAGE BASCILIC VEIN TRANSPOSITION;  Surgeon: Larina Earthly, MD;  Location: [facility] OR;  Service: Vascular;  Laterality: Left;   BIOPSY  10/24/2018   Procedure: BIOPSY;  Surgeon: Corbin Ade, MD;  [facility];  Service: Endoscopy;;  right and left colon   CATARACT EXTRACTION W/ INTRAOCULAR LENS IMPLANT Right    CESAREAN SECTION  1994; 1998   CHOLECYSTECTOMY N/A 03/09/2018   Procedure: LAPAROSCOPIC CHOLECYSTECTOMY WITH INTRAOPERATIVE CHOLANGIOGRAM ERAS PATHWAY;  Surgeon: Manus Rudd, MD;  Location: [facility] OR;  Service: General;  Laterality: N/A;   COLONOSCOPY N/A 10/24/2018   Procedure: COLONOSCOPY;  Surgeon: Corbin Ade, MD;  [facility];  Service: Endoscopy;  Laterality: N/A;  1:00pm   EYE SURGERY Left 05/15/2018   Removal of blood in the globe (due to DM)   FLEXIBLE SIGMOIDOSCOPY N/A 11/24/2017   Procedure: FLEXIBLE SIGMOIDOSCOPY;  Surgeon: Corbin Ade, MD;  [facility];  Service: Endoscopy;  Laterality: N/A;   IR FLUORO GUIDE CV LINE RIGHT  07/17/2019   IR GASTROSTOMY TUBE MOD SED  09/16/2021   IR PARACENTESIS  07/09/2019   IR US GUIDE VASC ACCESS RIGHT  07/17/2019   LAPAROSCOPIC CHOLECYSTECTOMY  03/09/2018   LAPAROSCOPIC REVISION VENTRICULAR-PERITONEAL (V-P) SHUNT  09/08/2021   Procedure: LAPAROSCOPIC ASSISTANCE FOR  VENTRICULAR-PERITONEAL (V-P) SHUNT PLACEMENT ;  Surgeon: Bedelia Person, MD;  Location: [facility] OR;  Service: Neurosurgery;;   PERITONEAL CATHETER INSERTION  2017   POLYPECTOMY  10/24/2018   Procedure: POLYPECTOMY;  Surgeon: Corbin Ade, MD;  [facility];  Service: Endoscopy;;   TOTAL HIP  ARTHROPLASTY Left 1997   VENTRICULOPERITONEAL SHUNT N/A 09/08/2021   Procedure: SHUNT INSERTION VENTRICULAR-PERITONEAL;  Surgeon: Bedelia Person, MD;  Location[facility] OR;  Service: Neurosurgery;  Laterality: N/A;    Current Outpatient Medications  Medication Sig Dispense Refill   albuterol (VENTOLIN HFA) 108 (90 Base) MCG/ACT inhaler Inhale 2 puffs into the lungs every 6 (six) hours as needed for wheezing or shortness of breath. 8 g 2   amantadine (SYMMETREL) 50 MG/5ML solution Take 100 mg by mouth 2 (two) times daily. (Patient not taking: Reported on 03/02/2022)     amLODipine (NORVASC) 2.5 MG tablet Take 1 tablet (2.5 mg total) by mouth 2 (two) times daily. 180 tablet 1   B Complex-C-Folic Acid (RENA-VITE PO) Take 1 tablet by mouth daily.     calcitRIOL (ROCALTROL) 0.5 MCG capsule TAKE 2 CAPSULES BY MOUTH EVERY MONDAY, WEDNESDAY, AND FRIDAY FOR KIDNEY DISEASE. 30 capsule 0   carvedilol (COREG) 6.25 MG tablet TAKE (1) TABLET BY MOUTH TWICE DAILY. 60 tablet 0   cetirizine (ZYRTEC) 10 MG tablet TAKE 1 TABLET BY MOUTH ONCE DAILY. 30 tablet 0   guaiFENesin-codeine (CHERATUSSIN AC) 100-10 MG/5ML syrup Take 5 mLs by mouth 3 (three) times daily as needed for cough. 120 mL 0   multivitamin (RENA-VIT) TABS tablet TAKE (1) TABLET BY MOUTH ONCE DAILY FOR KIDNEY DISEASE. 30 tablet 2   pantoprazole (PROTONIX) 40 MG tablet Take 1 tablet (40 mg total) by mouth daily. 90 tablet 3   scopolamine (TRANSDERM-SCOP) 1 MG/3DAYS Place 1 patch (1.5 mg total) onto the skin every 3 (three) days. 10 patch 0   traMADol (ULTRAM) 50 MG tablet Take 50 mg by mouth 2 (two) times daily as needed.     traZODone (DESYREL) 50 MG tablet Take 0.5-1 tablets (25-50 mg total) by mouth at bedtime as needed for sleep. 30 tablet 3   No current facility-administered medications for this visit.    Allergies as of 09/01/2022   (No Known Allergies)    Family History  Problem Relation Age of Onset   Heart disease Mother     Thrombocytopenia Mother        TTP   Heart failure Father    Kidney disease Paternal Grandfather    Colon cancer Neg Hx     Social History   Socioeconomic History   Marital status: Married    Spouse name: Not on file   Number of children: 2   Years of education: Not on file   Highest education level: Not on file  Occupational History   Not on file  Tobacco Use   Smoking status: Never    Passive exposure: Yes   Smokeless tobacco: Never  Vaping Use   Vaping Use: Never used  Substance and Sexual Activity   Alcohol use: Never   Drug use: Never   Sexual activity: Yes    Birth control/protection: I.U.D.    Comment: Mirena IUD  Other Topics Concern   Not on file  Social History Narrative   Not on file   Social Determinants of Corporate investment banker  Strain: Low Risk  (11/30/2021)   Overall Financial Resource Strain (CARDIA)    Difficulty of Paying Living Expenses: Not hard at all  Food Insecurity: No Food Insecurity (11/30/2021)   Hunger Vital Sign    Worried About Running Out of Food in the Last Year: Never true    Ran Out of Food in the Last Year: Never true  Transportation Needs: No Transportation Needs (11/30/2021)   PRAPARE - Administrator, Civil Service (Medical): No    Lack of Transportation (Non-Medical): No  Physical Activity: Sufficiently Active (11/30/2021)   Exercise Vital Sign    Days of Exercise per Week: 5 days    Minutes of Exercise per Session: 30 min  Stress: No Stress Concern Present (11/30/2021)   [employer] of Occupational Health - Occupational Stress Questionnaire    Feeling of Stress : Not at all  Social Connections: Socially Integrated (11/30/2021)   Social Connection and Isolation Panel [NHANES]    Frequency of Communication with Friends and Family: More than three times a week    Frequency of Social Gatherings with Friends and Family: More than three times a week    Attends Religious Services: More than 4 times per year     Active Member of [employer] or Organizations: Yes    Attends Engineer, structural: More than 4 times per year    Marital Status: Married    Review of Systems: Gen: Denies fever, chills, anorexia. Denies fatigue, weakness, weight loss.  CV: Denies chest pain, palpitations, syncope, peripheral edema, and claudication. Resp: Denies dyspnea at rest, cough, wheezing, coughing up blood, and pleurisy. GI: Denies vomiting blood, jaundice, and fecal incontinence.   Denies dysphagia or odynophagia. Derm: Denies rash, itching, dry skin Psych: Denies depression, anxiety, memory loss, confusion. No homicidal or suicidal ideation.  Heme: Denies bruising, bleeding, and enlarged lymph nodes.  Physical Exam: There were no vitals taken for this visit. General:   Alert and oriented. No distress noted. Pleasant and cooperative.  Head:  Normocephalic and atraumatic. Eyes:  Conjuctiva clear without scleral icterus. Heart:  S1, S2 present without murmurs appreciated. Lungs:  Clear to auscultation bilaterally. No wheezes, rales, or rhonchi. No distress.  Abdomen:  +BS, soft, non-tender and non-distended. No rebound or guarding. No HSM or masses noted. Msk:  Symmetrical without gross deformities. Normal posture. Extremities:  Without edema. Neurologic:  Alert and  oriented x4 Psych:  Normal mood and affect.    Assessment:     Plan:  ***   Ermalinda Memos, PA-C [facility] 09/01/2022

## 2022-09-01 ENCOUNTER — Ambulatory Visit (INDEPENDENT_AMBULATORY_CARE_PROVIDER_SITE_OTHER): Payer: 59 | Admitting: Gastroenterology

## 2022-09-01 ENCOUNTER — Ambulatory Visit (HOSPITAL_COMMUNITY): Payer: 59 | Attending: Internal Medicine | Admitting: Speech Pathology

## 2022-09-01 ENCOUNTER — Encounter (HOSPITAL_COMMUNITY): Payer: Self-pay | Admitting: Speech Pathology

## 2022-09-01 ENCOUNTER — Ambulatory Visit (HOSPITAL_COMMUNITY)
Admission: RE | Admit: 2022-09-01 | Discharge: 2022-09-01 | Disposition: A | Discharge status: Home / Self Care | Payer: 59 | Source: Ambulatory Visit | Attending: Nephrology | Admitting: Nephrology

## 2022-09-01 ENCOUNTER — Encounter: Payer: Self-pay | Admitting: Gastroenterology

## 2022-09-01 VITALS — BP 136/77 | HR 65 | Temp 97.7°F

## 2022-09-01 DIAGNOSIS — R1312 Dysphagia, oropharyngeal phase: Secondary | ICD-10-CM | POA: Insufficient documentation

## 2022-09-01 DIAGNOSIS — K219 Gastro-esophageal reflux disease without esophagitis: Secondary | ICD-10-CM | POA: Diagnosis not present

## 2022-09-01 DIAGNOSIS — R638 Other symptoms and signs concerning food and fluid intake: Secondary | ICD-10-CM | POA: Insufficient documentation

## 2022-09-01 DIAGNOSIS — R198 Other specified symptoms and signs involving the digestive system and abdomen: Secondary | ICD-10-CM | POA: Diagnosis not present

## 2022-09-01 DIAGNOSIS — Z86010 Personal history of colonic polyps: Secondary | ICD-10-CM | POA: Diagnosis not present

## 2022-09-01 NOTE — Patient Instructions (Signed)
Continue pantoprazole 40 mg daily 30 minutes before breakfast.  Start Benefiber 2 teaspoons daily x 2 weeks, then increase to twice daily.  This is to help with your bowel regularity.  As we discussed, you are currently due for surveillance colonoscopy due to your history of large polyp removed from your colon in 2020.  If you change your mind and would like to schedule colonoscopy prior to your follow-up visit, please let me know.  We will plan to follow-up with you in the office in 1 year or sooner if needed.  It was great to meet you today!  Ermalinda Memos, PA-C [facility]

## 2022-09-01 NOTE — Therapy (Signed)
[facility] Outpatient Rehabilitation at [facility] [address] [place], [place], [address] Phone: [phone]   Fax:  [phone]  Modified Barium Swallow  Patient Details  Name: Janet Mitchell MRN: [id number] Date of Birth: December 08, 1965 No data recorded   Today's Date: 09/01/2022  HPI/PMH: HPI: Janet Mitchell is a 57 y.o. female with past medical history of HTN, DM with ESRD on HD. She unfortunately suffered an Intracranial hemorrhage May 2023 status post external ventricular shunt placement.  Required trach and PEG tube placement. Most recent MBSS 09/21/21 detailed below. Pt is currently on regular/thin diet and reports no new swallowing problems.   Clinical Impression: Clinical Impression: Pt presents with mild/moderate oropharyngeal dysphagia. Pt presents with oral impairments including slow, uncoordinated mastication pattern, some lingual pumping, left pocketing with solids and decreased labial seal. Pt with two episodes of flash penetration and one episode of silent trace aspiration with thin liquids. Penetration and aspiraiton episodes are associated with decreased coordination/timing specifically with small sips or tsp sips of thin liquids. Note improved control of bolus and timing/coordination of swallow with larger bolus size via straw. Pt reports she primarily uses a straw at home. Pt with some left buccal pocketing that was cleared with liquid wash. Recommend continue with regular diet (adjusted with soft/small pieces of meats) and thin liquids; meds are ok whole with liquids. Recommend continue utilizing strategies re: alternating bites and sips, USE STRAWS, slow rate and avoid problematic textures if indicated. There are no further ST needs noted at this time.  Factors that may increase risk of adverse event in presence of aspiration Rubye Oaks & Clearance Coots 2021): No data recorded  Recommendations/Plan: Swallowing Evaluation Recommendations Swallowing Evaluation  Recommendations Recommendations: PO diet PO Diet Recommendation: Regular; Thin liquids (Level 0) Liquid Administration via: Cup; Straw Medication Administration: Whole meds with liquid Supervision: Patient able to self-feed Swallowing strategies  : Slow rate; Follow solids with liquids (Use a straw for liquids) Oral care recommendations: Oral care BID (2x/day)    Treatment Plan Treatment Plan Follow-up recommendations: No SLP follow up     Recommendations Recommendations for follow up therapy are one component of a multi-disciplinary discharge planning process, led by the attending physician.  Recommendations may be updated based on patient status, additional functional criteria and insurance authorization.  Assessment: Orofacial Exam: Orofacial Exam Oral Cavity: Oral Hygiene: WFL Oral Cavity - Dentition: Adequate natural dentition    Anatomy:  Anatomy: WFL   Boluses Administered: Boluses Administered Boluses Administered: Thin liquids (Level 0); Mildly thick liquids (Level 2, nectar thick); Moderately thick liquids (Level 3, honey thick); Puree; Solid     Oral Impairment Domain: Oral Impairment Domain Lip Closure: Escape from interlabial space or lateral juncture, no extension beyond vermillion border Tongue control during bolus hold: Escape to lateral buccal cavity/floor of mouth Bolus preparation/mastication: Disorganized chewing/mashing with solid pieces of bolus unchewed; Slow prolonged chewing/mashing with complete recollection Bolus transport/lingual motion: Slow tongue motion Oral residue: Trace residue lining oral structures; Residue collection on oral structures Location of oral residue : Tongue; Floor of mouth Initiation of pharyngeal swallow : Valleculae; Posterior laryngeal surface of the epiglottis; Pyriform sinuses     Pharyngeal Impairment Domain: Pharyngeal Impairment Domain Soft palate elevation: No bolus between soft palate (SP)/pharyngeal wall  (PW) Laryngeal elevation: Partial superior movement of thyroid cartilage/partial approximation of arytenoids to epiglottic petiole Anterior hyoid excursion: Partial anterior movement Epiglottic movement: Complete inversion Laryngeal vestibule closure: Complete, no air/contrast in laryngeal vestibule Pharyngeal stripping wave : Present -  complete Pharyngeal contraction (A/P view only): N/A Pharyngoesophageal segment opening: Complete distension and complete duration, no obstruction of flow Tongue base retraction: Trace column of contrast or air between tongue base and PPW Pharyngeal residue: Trace residue within or on pharyngeal structures Location of pharyngeal residue: Valleculae; Tongue base     Esophageal Impairment Domain: No data recorded  Pill: No data recorded  Penetration/Aspiration Scale Score: Penetration/Aspiration Scale Score 2.  Material enters airway, remains ABOVE vocal cords then ejected out: Thin liquids (Level 0) 6.  Material enters airway, passes BELOW cords then ejected out: Thin liquids (Level 0)    Compensatory Strategies: Compensatory Strategies Compensatory strategies: Yes Straw: Effective       General Information: Caregiver present: Yes   Diet Prior to this Study: Regular; Thin liquids (Level 0)    Temperature : Normal    Respiratory Status: WFL    Supplemental O2: None (Room air)    History of Recent Intubation: No   No data recorded Self-Feeding Abilities: Able to self-feed  Baseline vocal quality/speech: Normal  Volitional Cough: Able to elicit  Volitional Swallow: Able to elicit  No data recorded  Goal Planning: No data recorded No data recorded No data recorded No data recorded No data recorded  Pain: No data recorded  End of Session: Start Time:No data recorded Stop Time: No data recorded Time Calculation:No data recorded Charges: No data recorded SLP visit diagnosis: SLP Visit Diagnosis: Dysphagia, unspecified  (R13.10)    Past Medical History:  Past Medical History:  Diagnosis Date   Abdominal wall hematoma 12/26/2019   Anemia    Arthritis    CHF (congestive heart failure) (HCC)    pt states she has been cleared of heart failure/disease   Chronic cholecystitis with calculus    COVID-19 03/26/2019   COVID-19 virus infection 03/2019   ESRD (end stage renal disease) on dialysis [facility])    Dialysis M-W-F   Headache    "a few/wk" (03/09/2018) - no longer having these   History of blood transfusion 10/2017   "low blood count" (03/09/2018)   Hypertension    Respiratory failure requiring intubation (HCC)    Seasonal allergies    Spinal headache    Status post insertion of percutaneous endoscopic gastrostomy (PEG) tube (HCC) 01/26/2022   Type II diabetes mellitus (HCC)    Past Surgical History:  Past Surgical History:  Procedure Laterality Date   AMPUTATION TOE Left 2013   Great toe   AV FISTULA PLACEMENT Left 07/22/2019   Procedure: LEFT ARM BASILIC ARTERIOVENOUS (AV) FISTULA CREATION;  Surgeon: Larina Earthly, MD;  Location: [facility] OR;  Service: Vascular;  Laterality: Left;   BASCILIC VEIN TRANSPOSITION Left 10/17/2019   Procedure: LEFT SECOND STAGE BASCILIC VEIN TRANSPOSITION;  Surgeon: Larina Earthly, MD;  Location: [facility] OR;  Service: Vascular;  Laterality: Left;   BIOPSY  10/24/2018   Procedure: BIOPSY;  Surgeon: Corbin Ade, MD;  Location: [facility] ENDO SUITE;  Service: Endoscopy;;  right and left colon   CATARACT EXTRACTION W/ INTRAOCULAR LENS IMPLANT Right    CESAREAN SECTION  1994; 1998   CHOLECYSTECTOMY N/A 03/09/2018   Procedure: LAPAROSCOPIC CHOLECYSTECTOMY WITH INTRAOPERATIVE CHOLANGIOGRAM ERAS PATHWAY;  Surgeon: Manus Rudd, MD;  Location: [facility] OR;  Service: General;  Laterality: N/A;   COLONOSCOPY N/A 10/24/2018   Surgeon: Corbin Ade, MD; 15 mm adenomatous polyp in the transverse colon removed, segmental biopsies benign.   EYE SURGERY Left 05/15/2018   Removal of blood in the  globe (  due to DM)   FLEXIBLE SIGMOIDOSCOPY N/A 11/24/2017   Procedure: FLEXIBLE SIGMOIDOSCOPY;  Surgeon: Corbin Ade, MD;  Location: [facility] SUITE;  Service: Endoscopy;  Laterality: N/A;   IR FLUORO GUIDE CV LINE RIGHT  07/17/2019   IR GASTROSTOMY TUBE MOD SED  09/16/2021   IR PARACENTESIS  07/09/2019   IR US GUIDE VASC ACCESS RIGHT  07/17/2019   LAPAROSCOPIC CHOLECYSTECTOMY  03/09/2018   LAPAROSCOPIC REVISION VENTRICULAR-PERITONEAL (V-P) SHUNT  09/08/2021   Procedure: LAPAROSCOPIC ASSISTANCE FOR  VENTRICULAR-PERITONEAL (V-P) SHUNT PLACEMENT ;  Surgeon: Bedelia Person, MD;  Location: [facility] OR;  Service: Neurosurgery;;   PERITONEAL CATHETER INSERTION  2017   POLYPECTOMY  10/24/2018   Procedure: POLYPECTOMY;  Surgeon: Corbin Ade, MD;  Location: [facility];  Service: Endoscopy;;   TOTAL HIP ARTHROPLASTY Left 1997   VENTRICULOPERITONEAL SHUNT N/A 09/08/2021   Procedure: SHUNT INSERTION VENTRICULAR-PERITONEAL;  Surgeon: Bedelia Person, MD;  Location: [facility] OR;  Service: Neurosurgery;  Laterality: N/A;   Terie Lear H. Romie Levee, CCC-SLP Speech Language Pathologist  Georgetta Haber 09/01/2022, 2:39 PM  Patient will benefit from skilled therapeutic intervention in order to improve the following deficits and impairments:   Dysphagia, oropharyngeal phase        Problem List Patient Active Problem List   Diagnosis Date Noted   Acute bronchitis 06/02/2022   Primary insomnia 06/02/2022   Wheelchair dependent 05/31/2022   Left hemiplegia (HCC) 01/27/2022   Thrombocytopenia (HCC) 10/04/2021   History of tracheostomy 09/22/2021   Dysphagia 09/22/2021   Intracranial hemorrhage (HCC) 08/23/2021   Proliferative diabetic retinopathy of right eye determined by examination (HCC) 06/29/2021   Stable treated proliferative diabetic retinopathy of left eye determined by examination associated with type 2 diabetes mellitus (HCC) 06/29/2021   Hyponatremia    ESRD (end stage renal  disease) on dialysis (HCC)    Essential hypertension 02/14/2019   Obesity, morbid, BMI 50 or higher (HCC) 02/14/2019   Abnormal nuclear stress test 11/02/2018   Rectal bleeding 08/27/2018   Diarrhea 05/22/2018   Anemia 03/25/2018   Chronic cholecystitis with calculus 03/09/2018   Type 2 diabetes mellitus (HCC) 03/09/2018   Pre-transplant evaluation for ESRD (end stage renal disease) 12/19/2017   Dependence on renal dialysis (HCC) 11/06/2017   Diabetic retinopathy (HCC) 01/23/2016   End stage renal disease on dialysis due to type 2 diabetes mellitus (HCC) 01/23/2016   Renal osteodystrophy 01/23/2016   Diabetic macular edema (HCC) 04/10/2015   Edema of lower extremity 04/10/2015   Paresthesia of lower extremity 04/10/2015   Recurrent falls 04/10/2015    Georgetta Haber, CCC-SLP 09/01/2022, 2:38 PM  [facility] Outpatient Rehabilitation at [facility] [address] [place], [place], [address] Phone: [phone]   Fax:  [phone]  Name: Jali Cockfield Mitchell MRN: [id number] Date of Birth: Jul 09, 1965

## 2022-09-13 LAB — HM DIABETES EYE EXAM

## 2022-09-19 ENCOUNTER — Other Ambulatory Visit: Payer: Self-pay | Admitting: Internal Medicine

## 2022-09-26 ENCOUNTER — Other Ambulatory Visit: Payer: Self-pay | Admitting: Internal Medicine

## 2022-10-03 ENCOUNTER — Other Ambulatory Visit: Payer: Self-pay | Admitting: Internal Medicine

## 2022-10-04 ENCOUNTER — Encounter: Payer: Self-pay | Admitting: Internal Medicine

## 2022-10-04 ENCOUNTER — Ambulatory Visit (INDEPENDENT_AMBULATORY_CARE_PROVIDER_SITE_OTHER): Payer: 59 | Admitting: Internal Medicine

## 2022-10-04 VITALS — BP 154/86 | HR 61

## 2022-10-04 DIAGNOSIS — E1122 Type 2 diabetes mellitus with diabetic chronic kidney disease: Secondary | ICD-10-CM

## 2022-10-04 DIAGNOSIS — Z992 Dependence on renal dialysis: Secondary | ICD-10-CM

## 2022-10-04 DIAGNOSIS — F5101 Primary insomnia: Secondary | ICD-10-CM

## 2022-10-04 DIAGNOSIS — N186 End stage renal disease: Secondary | ICD-10-CM

## 2022-10-04 DIAGNOSIS — H4311 Vitreous hemorrhage, right eye: Secondary | ICD-10-CM

## 2022-10-04 DIAGNOSIS — G4733 Obstructive sleep apnea (adult) (pediatric): Secondary | ICD-10-CM | POA: Diagnosis not present

## 2022-10-04 DIAGNOSIS — I10 Essential (primary) hypertension: Secondary | ICD-10-CM | POA: Diagnosis not present

## 2022-10-04 DIAGNOSIS — Z794 Long term (current) use of insulin: Secondary | ICD-10-CM

## 2022-10-04 HISTORY — DX: Obstructive sleep apnea (adult) (pediatric): G47.33

## 2022-10-04 MED ORDER — CARVEDILOL 6.25 MG PO TABS: 6.2500 mg | ORAL_TABLET | Freq: Two times a day (BID) | ORAL | 2 refills | Status: DC

## 2022-10-04 MED ORDER — AMLODIPINE BESYLATE 10 MG PO TABS
10.0000 mg | ORAL_TABLET | Freq: Every evening | ORAL | 1 refills | Status: DC
Start: 2022-10-04 — End: 2023-03-20

## 2022-10-04 NOTE — Patient Instructions (Signed)
Please start taking Amlodipine 10 mg in the evening as prescribed.  Please continue to take medications as prescribed.  Please continue to follow low salt diet and ambulate as tolerated.

## 2022-10-04 NOTE — Assessment & Plan Note (Signed)
STOP-BANG: 7 At high risk for moderate to severe OSA Check home sleep study

## 2022-10-04 NOTE — Assessment & Plan Note (Signed)
Lab Results  Component Value Date   HGBA1C 5.7 05/25/2022   Well-controlled Has not been on Toujeo now Follows up with Nephrologist for ESRD on HD On kidney transplant list at [facility] 

## 2022-10-04 NOTE — Progress Notes (Signed)
Established Patient Office Visit  Subjective:  Patient ID: Janet Mitchell, female    DOB: 17-Apr-1965  Age: 57 y.o. MRN: [id number]  CC:  Chief Complaint  Patient presents with   Hypertension    Four month follow up    Sleep Apnea    Patient would like to get cpap for sleep apnea     HPI Janet Mitchell is a 57 y.o. female with past medical history of HTN, DM with ESRD on HD and intracranial hemorrhage who presents for f/u of her chronic medical conditions.   She is currently on HD for ESRD.  Her BP is elevated today.  She takes amlodipine 2.5 mg twice daily and Coreg 6.25 mg twice daily.  Denies any chest pain, dyspnea or palpitations currently.  Her blood glucose has been well controlled without needing insulin.  She has mild improvement in sleep with trazodone.  She has nighttime snoring and daytime fatigue.  She has wide neck circumference and had been told of OSA in the past.  She currently does not have CPAP device.  She is mostly wheelchair dependent and is likely that she might be taking daytime naps.  She had intracranial hemorrhage in 05/23.  She was admitted at [facility] for right-sided thalamic hemorrhage, for which she had external ventricular shunt placement by neurosurgery.  She had trach and PEG tube placed, and was discharged to SNF on 10/05/21. She has residual left-sided hemiplegia and is wheelchair-bound currently.  She had home PT, but still has not improved.  She had vitreous hemorrhage of the right eye and is planned to get vitrectomy on 10/06/2022.  She has mild blurring of the vision on right side.  Past Medical History:  Diagnosis Date   Abdominal wall hematoma 12/26/2019   Anemia    Arthritis    CHF (congestive heart failure) (HCC)    pt states she has been cleared of heart failure/disease   Chronic cholecystitis with calculus    COVID-19 03/26/2019   COVID-19 virus infection 03/2019   ESRD (end stage renal disease) on dialysis [facility])     Dialysis M-W-F   Headache    "a few/wk" (03/09/2018) - no longer having these   History of blood transfusion 10/2017   "low blood count" (03/09/2018)   Hypertension    OSA (obstructive sleep apnea) 10/04/2022   Respiratory failure requiring intubation (HCC)    Seasonal allergies    Spinal headache    Status post insertion of percutaneous endoscopic gastrostomy (PEG) tube (HCC) 01/26/2022   Type II diabetes mellitus (HCC)     Past Surgical History:  Procedure Laterality Date   AMPUTATION TOE Left 2013   Great toe   AV FISTULA PLACEMENT Left 07/22/2019   Procedure: LEFT ARM BASILIC ARTERIOVENOUS (AV) FISTULA CREATION;  Surgeon: Larina Earthly, MD;  Location: [facility] OR;  Service: Vascular;  Laterality: Left;   BASCILIC VEIN TRANSPOSITION Left 10/17/2019   Procedure: LEFT SECOND STAGE BASCILIC VEIN TRANSPOSITION;  Surgeon: Larina Earthly, MD;  Location: [facility] OR;  Service: Vascular;  Laterality: Left;   BIOPSY  10/24/2018   Procedure: BIOPSY;  Surgeon: Corbin Ade, MD;  Location: [facility] ENDO SUITE;  Service: Endoscopy;;  right and left colon   CATARACT EXTRACTION W/ INTRAOCULAR LENS IMPLANT Right    CESAREAN SECTION  1994; 1998   CHOLECYSTECTOMY N/A 03/09/2018   Procedure: LAPAROSCOPIC CHOLECYSTECTOMY WITH INTRAOPERATIVE CHOLANGIOGRAM ERAS PATHWAY;  Surgeon: Manus Rudd, MD;  Location: [facility] OR;  Service: General;  Laterality: N/A;   COLONOSCOPY N/A 10/24/2018   Surgeon: Corbin Ade, MD; 15 mm adenomatous polyp in the transverse colon removed, segmental biopsies benign.   EYE SURGERY Left 05/15/2018   Removal of blood in the globe (due to DM)   FLEXIBLE SIGMOIDOSCOPY N/A 11/24/2017   Procedure: FLEXIBLE SIGMOIDOSCOPY;  Surgeon: Corbin Ade, MD;  Location: [facility];  Service: Endoscopy;  Laterality: N/A;   IR FLUORO GUIDE CV LINE RIGHT  07/17/2019   IR GASTROSTOMY TUBE MOD SED  09/16/2021   IR PARACENTESIS  07/09/2019   IR US GUIDE VASC ACCESS RIGHT  07/17/2019   LAPAROSCOPIC  CHOLECYSTECTOMY  03/09/2018   LAPAROSCOPIC REVISION VENTRICULAR-PERITONEAL (V-P) SHUNT  09/08/2021   Procedure: LAPAROSCOPIC ASSISTANCE FOR  VENTRICULAR-PERITONEAL (V-P) SHUNT PLACEMENT ;  Surgeon: Bedelia Person, MD;  Location: [facility];  Service: Neurosurgery;;   PERITONEAL CATHETER INSERTION  2017   POLYPECTOMY  10/24/2018   Procedure: POLYPECTOMY;  Surgeon: Corbin Ade, MD;  [facility];  Service: Endoscopy;;   TOTAL HIP ARTHROPLASTY Left 1997   VENTRICULOPERITONEAL SHUNT N/A 09/08/2021   Procedure: SHUNT INSERTION VENTRICULAR-PERITONEAL;  Surgeon: Bedelia Person, MD;  Location: [facility] OR;  Service: Neurosurgery;  Laterality: N/A;    Family History  Problem Relation Age of Onset   Heart disease Mother    Thrombocytopenia Mother        TTP   Heart failure Father    Kidney disease Paternal Grandfather    Colon cancer Neg Hx     Social History   Socioeconomic History   Marital status: Married    Spouse name: Not on file   Number of children: 2   Years of education: Not on file   Highest education level: Not on file  Occupational History   Not on file  Tobacco Use   Smoking status: Never    Passive exposure: Yes   Smokeless tobacco: Never  Vaping Use   Vaping Use: Never used  Substance and Sexual Activity   Alcohol use: Never   Drug use: Never   Sexual activity: Yes    Birth control/protection: I.U.D.    Comment: Mirena IUD  Other Topics Concern   Not on file  Social History Narrative   Not on file   Social Determinants of Health   Financial Resource Strain: Low Risk  (11/30/2021)   Overall Financial Resource Strain (CARDIA)    Difficulty of Paying Living Expenses: Not hard at all  Food Insecurity: No Food Insecurity (11/30/2021)   Hunger Vital Sign    Worried About Running Out of Food in the Last Year: Never true    Ran Out of Food in the Last Year: Never true  Transportation Needs: No Transportation Needs (11/30/2021)   PRAPARE - Therapist, art (Medical): No    Lack of Transportation (Non-Medical): No  Physical Activity: Sufficiently Active (11/30/2021)   Exercise Vital Sign    Days of Exercise per Week: 5 days    Minutes of Exercise per Session: 30 min  Stress: No Stress Concern Present (11/30/2021)   [employer] of Occupational Health - Occupational Stress Questionnaire    Feeling of Stress : Not at all  Social Connections: Socially Integrated (11/30/2021)   Social Connection and Isolation Panel [NHANES]    Frequency of Communication with Friends and Family: More than three times a week    Frequency of Social Gatherings with Friends and Family: More than three times a week  Attends Religious Services: More than 4 times per year    Active Member of Clubs or Organizations: Yes    Attends Banker Meetings: More than 4 times per year    Marital Status: Married  Catering manager Violence: Not At Risk (11/30/2021)   Humiliation, Afraid, Rape, and Kick questionnaire    Fear of Current or Ex-Partner: No    Emotionally Abused: No    Physically Abused: No    Sexually Abused: No    Outpatient Medications Prior to Visit  Medication Sig Dispense Refill   albuterol (VENTOLIN HFA) 108 (90 Base) MCG/ACT inhaler Inhale 2 puffs into the lungs every 6 (six) hours as needed for wheezing or shortness of breath. 8 g 2   amantadine (SYMMETREL) 50 MG/5ML solution Take 100 mg by mouth 2 (two) times daily. (Patient not taking: Reported on 03/02/2022)     B Complex-C-Folic Acid (RENA-VITE PO) Take 1 tablet by mouth daily.     calcitRIOL (ROCALTROL) 0.5 MCG capsule TAKE 2 CAPSULES BY MOUTH EVERY MONDAY, WEDNESDAY, AND FRIDAY FOR KIDNEY DISEASE. 30 capsule 0   cetirizine (ZYRTEC) 10 MG tablet TAKE 1 TABLET BY MOUTH ONCE DAILY. 30 tablet 0   multivitamin (RENA-VIT) TABS tablet TAKE (1) TABLET BY MOUTH ONCE DAILY FOR KIDNEY DISEASE. 30 tablet 0   pantoprazole (PROTONIX) 40 MG tablet Take 1 tablet (40 mg total) by  mouth daily. 90 tablet 3   sevelamer carbonate (RENVELA) 0.8 g PACK packet SMARTSIG:1 Packet(s) Gastro Tube 3 Times Daily     traMADol (ULTRAM) 50 MG tablet Take 50 mg by mouth 2 (two) times daily as needed.     traZODone (DESYREL) 50 MG tablet Take 0.5-1 tablets (25-50 mg total) by mouth at bedtime as needed for sleep. 30 tablet 3   venlafaxine (EFFEXOR) 75 MG tablet Take 1 tablet every day by oral route with meals for 30 days.     amLODipine (NORVASC) 2.5 MG tablet Take 1 tablet (2.5 mg total) by mouth 2 (two) times daily. 180 tablet 1   carvedilol (COREG) 6.25 MG tablet TAKE (1) TABLET BY MOUTH TWICE DAILY. 60 tablet 0   No facility-administered medications prior to visit.    No Known Allergies  ROS Review of Systems  Constitutional:  Negative for chills and fever.  HENT:  Negative for congestion, sinus pressure and sinus pain.   Eyes:  Positive for pain, redness and visual disturbance. Negative for discharge.  Respiratory:  Negative for cough and shortness of breath.   Cardiovascular:  Negative for chest pain and palpitations.  Gastrointestinal:  Negative for diarrhea, nausea and vomiting.  Genitourinary:  Negative for dysuria and hematuria.  Musculoskeletal:  Negative for neck pain and neck stiffness.  Skin:  Negative for rash.  Neurological:  Positive for weakness and numbness.  Psychiatric/Behavioral:  Positive for sleep disturbance. Negative for agitation and behavioral problems. The patient is nervous/anxious.       Objective:    Physical Exam Constitutional:      General: She is not in acute distress.    Appearance: She is obese. She is not diaphoretic.     Comments: In wheelchair  HENT:     Head: Normocephalic.     Nose: No congestion.     Mouth/Throat:     Mouth: Mucous membranes are moist.     Pharynx: No posterior oropharyngeal erythema.  Eyes:     General: No scleral icterus.    Conjunctiva/sclera:     Right eye: Right conjunctiva  is injected.     Pupils:  Pupils are equal, round, and reactive to light.  Cardiovascular:     Rate and Rhythm: Normal rate and regular rhythm.     Heart sounds: Normal heart sounds. No murmur heard. Pulmonary:     Breath sounds: Normal breath sounds. No wheezing or rales.  Musculoskeletal:     Cervical back: Neck supple. No tenderness.     Right lower leg: No edema.     Left lower leg: No edema.  Skin:    General: Skin is warm.     Findings: No rash.     Comments: Left UE AV fistula  Neurological:     Mental Status: She is alert and oriented to person, place, and time.     Sensory: Sensory deficit (Left UE and LE) present.     Motor: Weakness (Left UE and LE - 1/5, right UE and LE - 4/5) present.  Psychiatric:        Mood and Affect: Mood normal.        Behavior: Behavior normal.     BP (!) 154/86 (BP Location: Right Arm, Cuff Size: Normal)   Pulse 61   SpO2 92%  Wt Readings from Last 3 Encounters:  03/04/22 208 lb 5.4 oz (94.5 kg)  10/27/21 210 lb (95.3 kg)  10/05/21 212 lb 11.9 oz (96.5 kg)    Lab Results  Component Value Date   TSH 1.265 08/24/2021   Lab Results  Component Value Date   WBC 4.1 03/04/2022   HGB 11.6 (L) 03/04/2022   HCT 34.0 (L) 03/04/2022   MCV 76.2 (L) 03/04/2022   PLT 138 (L) 03/04/2022   Lab Results  Component Value Date   NA 135 03/04/2022   K 6.5 (HH) 03/04/2022   CO2 28 03/04/2022   GLUCOSE 147 (H) 03/04/2022   BUN 30 (H) 03/04/2022   CREATININE 3.60 (H) 03/04/2022   BILITOT 0.8 03/04/2022   ALKPHOS 68 03/04/2022   AST 36 03/04/2022   ALT 16 03/04/2022   PROT 6.7 03/04/2022   ALBUMIN 3.0 (L) 03/04/2022   CALCIUM 8.2 (L) 03/04/2022   ANIONGAP 9 03/04/2022   Lab Results  Component Value Date   CHOL 178 08/23/2021   Lab Results  Component Value Date   HDL 63 08/23/2021   Lab Results  Component Value Date   LDLCALC 38 08/23/2021   Lab Results  Component Value Date   TRIG 174 (H) 08/28/2021   Lab Results  Component Value Date   CHOLHDL 2.8  08/23/2021   Lab Results  Component Value Date   HGBA1C 5.7 05/25/2022      Assessment & Plan:   Problem List Items Addressed This Visit       Cardiovascular and Mediastinum   Essential hypertension - Primary    BP Readings from Last 1 Encounters:  10/04/22 (!) 154/86  Uncontrolled with amlodipine and carvedilol Increased dose of amlodipine to 10 mg QD Counseled for compliance with the medications Advised to follow renal diet      Relevant Medications   amLODipine (NORVASC) 10 MG tablet   carvedilol (COREG) 6.25 MG tablet     Respiratory   OSA (obstructive sleep apnea)    STOP-BANG: 7 At high risk for moderate to severe OSA Check home sleep study       Relevant Orders   Home sleep test     Endocrine   Type 2 diabetes mellitus (HCC)    Lab Results  Component  Value Date   HGBA1C 5.7 05/25/2022  Well-controlled Has not been on Toujeo now Follows up with Nephrologist for ESRD on HD On kidney transplant list at [facility]        Genitourinary   ESRD (end stage renal disease) on dialysis [facility])    Followed by Nephrology On HD MWF        Other   Primary insomnia    Improved with trazodone as needed Sleep hygiene discussed Needs sleep study for OSA       Vitreous hemorrhage, right eye (HCC)    Planned for posterior vitrectomy on 10/06/22      Meds ordered this encounter  Medications   amLODipine (NORVASC) 10 MG tablet    Sig: Take 1 tablet (10 mg total) by mouth every evening.    Dispense:  90 tablet    Refill:  1   carvedilol (COREG) 6.25 MG tablet    Sig: Take 1 tablet (6.25 mg total) by mouth 2 (two) times daily with a meal.    Dispense:  60 tablet    Refill:  2    Follow-up: Return in about 6 weeks (around 11/15/2022) for HTN.    Anabel Halon, MD

## 2022-10-04 NOTE — Assessment & Plan Note (Signed)
Planned for posterior vitrectomy on 10/06/22

## 2022-10-04 NOTE — Assessment & Plan Note (Signed)
BP Readings from Last 1 Encounters:  10/04/22 (!) 154/86   Uncontrolled with amlodipine and carvedilol Increased dose of amlodipine to 10 mg QD Counseled for compliance with the medications Advised to follow renal diet

## 2022-10-04 NOTE — Assessment & Plan Note (Signed)
Improved with trazodone as needed Sleep hygiene discussed Needs sleep study for OSA

## 2022-10-04 NOTE — Assessment & Plan Note (Signed)
Followed by Nephrology On HD MWF 

## 2022-10-20 ENCOUNTER — Other Ambulatory Visit: Payer: Self-pay | Admitting: Internal Medicine

## 2022-11-10 ENCOUNTER — Other Ambulatory Visit: Payer: Self-pay | Admitting: Internal Medicine

## 2022-11-15 ENCOUNTER — Encounter: Payer: Self-pay | Admitting: Internal Medicine

## 2022-11-15 ENCOUNTER — Ambulatory Visit (INDEPENDENT_AMBULATORY_CARE_PROVIDER_SITE_OTHER): Payer: 59 | Admitting: Internal Medicine

## 2022-11-15 VITALS — BP 134/82 | HR 67

## 2022-11-15 DIAGNOSIS — Z0001 Encounter for general adult medical examination with abnormal findings: Secondary | ICD-10-CM

## 2022-11-15 DIAGNOSIS — N185 Chronic kidney disease, stage 5: Secondary | ICD-10-CM

## 2022-11-15 DIAGNOSIS — F5101 Primary insomnia: Secondary | ICD-10-CM | POA: Diagnosis not present

## 2022-11-15 DIAGNOSIS — I10 Essential (primary) hypertension: Secondary | ICD-10-CM

## 2022-11-15 DIAGNOSIS — Z794 Long term (current) use of insulin: Secondary | ICD-10-CM

## 2022-11-15 DIAGNOSIS — E1122 Type 2 diabetes mellitus with diabetic chronic kidney disease: Secondary | ICD-10-CM

## 2022-11-15 DIAGNOSIS — G8194 Hemiplegia, unspecified affecting left nondominant side: Secondary | ICD-10-CM

## 2022-11-15 DIAGNOSIS — J309 Allergic rhinitis, unspecified: Secondary | ICD-10-CM | POA: Diagnosis not present

## 2022-11-15 DIAGNOSIS — E113553 Type 2 diabetes mellitus with stable proliferative diabetic retinopathy, bilateral: Secondary | ICD-10-CM

## 2022-11-15 MED ORDER — AZELASTINE HCL 0.1 % NA SOLN
1.0000 | Freq: Two times a day (BID) | NASAL | 5 refills | Status: DC
Start: 2022-11-15 — End: 2023-10-04

## 2022-11-15 MED ORDER — TRAZODONE HCL 50 MG PO TABS
25.0000 mg | ORAL_TABLET | Freq: Every evening | ORAL | 3 refills | Status: DC | PRN
Start: 2022-11-15 — End: 2023-05-15

## 2022-11-15 NOTE — Assessment & Plan Note (Signed)
Improved with trazodone as needed Sleep hygiene discussed Recently had sleep study for OSA - awaiting results

## 2022-11-15 NOTE — Assessment & Plan Note (Addendum)
Physical exam as documented. Fasting blood tests reviewed today from HD center. Advised to get Shingrix vaccines at local pharmacy. Prefers to wait for mammography for now.

## 2022-11-15 NOTE — Patient Instructions (Signed)
Please continue to take medications as prescribed.  Please continue to follow low carb diet and perform moderate exercise/walking at least 150 mins/week.  Please use Azelastine nasal spray for nasal congestion.

## 2022-11-15 NOTE — Progress Notes (Signed)
Established Patient Office Visit  Subjective:  Patient ID: Janet Mitchell, female    DOB: 1965-12-22  Age: 57 y.o. MRN: [id number]  CC:  Chief Complaint  Patient presents with   Hypertension    Six week follow up    Annual Exam    HPI Janet Mitchell is a 57 y.o. female with past medical history of HTN, DM with ESRD on HD and intracranial hemorrhage who presents for annual physical.  She is currently on HD for ESRD.  Her BP is well controlled today.  She takes amlodipine 10 mg twice daily and Coreg 6.25 mg twice daily.  Denies any chest pain, dyspnea or palpitations currently.  Her blood glucose has been well controlled without needing insulin.   She has mild improvement in sleep with trazodone.  She has nighttime snoring and daytime fatigue.  She has wide neck circumference and had been told of OSA in the past.  She currently does not have CPAP device.  She is mostly wheelchair dependent and is likely that she might be taking daytime naps.   She had intracranial hemorrhage in 05/23.  She was admitted at [facility] for right-sided thalamic hemorrhage, for which she had external ventricular shunt placement by neurosurgery.  She had trach and PEG tube placed, and was discharged to SNF on 10/05/21. She has residual left-sided hemiplegia and is wheelchair-bound currently.  She had home PT and her LUE and LLE weakness have improved slightly.  She had vitreous hemorrhage of the right eye and is planned to get vitrectomy on 10/06/2022.  She has mild blurring of the vision on right side.    Past Medical History:  Diagnosis Date   Abdominal wall hematoma 12/26/2019   Anemia    Arthritis    CHF (congestive heart failure) (HCC)    pt states she has been cleared of heart failure/disease   Chronic cholecystitis with calculus    COVID-19 03/26/2019   COVID-19 virus infection 03/2019   ESRD (end stage renal disease) on dialysis [facility])    Dialysis M-W-F   Headache    "a  few/wk" (03/09/2018) - no longer having these   History of blood transfusion 10/2017   "low blood count" (03/09/2018)   Hypertension    OSA (obstructive sleep apnea) 10/04/2022   Respiratory failure requiring intubation (HCC)    Seasonal allergies    Spinal headache    Status post insertion of percutaneous endoscopic gastrostomy (PEG) tube (HCC) 01/26/2022   Type II diabetes mellitus (HCC)     Past Surgical History:  Procedure Laterality Date   AMPUTATION TOE Left 2013   Great toe   AV FISTULA PLACEMENT Left 07/22/2019   Procedure: LEFT ARM BASILIC ARTERIOVENOUS (AV) FISTULA CREATION;  Surgeon: Larina Earthly, MD;  Location: [facility] OR;  Service: Vascular;  Laterality: Left;   BASCILIC VEIN TRANSPOSITION Left 10/17/2019   Procedure: LEFT SECOND STAGE BASCILIC VEIN TRANSPOSITION;  Surgeon: Larina Earthly, MD;  Location: [facility] OR;  Service: Vascular;  Laterality: Left;   BIOPSY  10/24/2018   Procedure: BIOPSY;  Surgeon: Corbin Ade, MD;  Location: [facility] ENDO SUITE;  Service: Endoscopy;;  right and left colon   CATARACT EXTRACTION W/ INTRAOCULAR LENS IMPLANT Right    CESAREAN SECTION  1994; 1998   CHOLECYSTECTOMY N/A 03/09/2018   Procedure: LAPAROSCOPIC CHOLECYSTECTOMY WITH INTRAOPERATIVE CHOLANGIOGRAM ERAS PATHWAY;  Surgeon: Manus Rudd, MD;  Location: [facility] OR;  Service: General;  Laterality: N/A;   COLONOSCOPY N/A 10/24/2018  Surgeon: Corbin Ade, MD; 15 mm adenomatous polyp in the transverse colon removed, segmental biopsies benign.   EYE SURGERY Left 05/15/2018   Removal of blood in the globe (due to DM)   FLEXIBLE SIGMOIDOSCOPY N/A 11/24/2017   Procedure: FLEXIBLE SIGMOIDOSCOPY;  Surgeon: Corbin Ade, MD;  Location: [facility] ENDO [facility];  Service: Endoscopy;  Laterality: N/A;   IR FLUORO GUIDE CV LINE RIGHT  07/17/2019   IR GASTROSTOMY TUBE MOD SED  09/16/2021   IR PARACENTESIS  07/09/2019   IR US GUIDE VASC ACCESS RIGHT  07/17/2019   LAPAROSCOPIC CHOLECYSTECTOMY  03/09/2018    LAPAROSCOPIC REVISION VENTRICULAR-PERITONEAL (V-P) SHUNT  09/08/2021   Procedure: LAPAROSCOPIC ASSISTANCE FOR  VENTRICULAR-PERITONEAL (V-P) SHUNT PLACEMENT ;  Surgeon: Bedelia Person, MD;  Location: [facility] OR;  Service: Neurosurgery;;   PERITONEAL CATHETER INSERTION  2017   POLYPECTOMY  10/24/2018   Procedure: POLYPECTOMY;  Surgeon: Corbin Ade, MD;  [facility];  Service: Endoscopy;;   TOTAL HIP ARTHROPLASTY Left 1997   VENTRICULOPERITONEAL SHUNT N/A 09/08/2021   Procedure: SHUNT INSERTION VENTRICULAR-PERITONEAL;  Surgeon: Bedelia Person, MD;  Location: [facility] OR;  Service: Neurosurgery;  Laterality: N/A;    Family History  Problem Relation Age of Onset   Heart disease Mother    Thrombocytopenia Mother        TTP   Heart failure Father    Kidney disease Paternal Grandfather    Colon cancer Neg Hx     Social History   Socioeconomic History   Marital status: Married    Spouse name: Not on file   Number of children: 2   Years of education: Not on file   Highest education level: Not on file  Occupational History   Not on file  Tobacco Use   Smoking status: Never    Passive exposure: Yes   Smokeless tobacco: Never  Vaping Use   Vaping status: Never Used  Substance and Sexual Activity   Alcohol use: Never   Drug use: Never   Sexual activity: Yes    Birth control/protection: I.U.D.    Comment: Mirena IUD  Other Topics Concern   Not on file  Social History Narrative   Not on file   Social Determinants of Health   Financial Resource Strain: Low Risk  (11/30/2021)   Overall Financial Resource Strain (CARDIA)    Difficulty of Paying Living Expenses: Not hard at all  Food Insecurity: No Food Insecurity (11/30/2021)   Hunger Vital Sign    Worried About Running Out of Food in the Last Year: Never true    Ran Out of Food in the Last Year: Never true  Transportation Needs: No Transportation Needs (11/30/2021)   PRAPARE - Administrator, Civil Service  (Medical): No    Lack of Transportation (Non-Medical): No  Physical Activity: Sufficiently Active (11/30/2021)   Exercise Vital Sign    Days of Exercise per Week: 5 days    Minutes of Exercise per Session: 30 min  Stress: No Stress Concern Present (11/30/2021)   [employer] of Occupational Health - Occupational Stress Questionnaire    Feeling of Stress : Not at all  Social Connections: Socially Integrated (11/30/2021)   Social Connection and Isolation Panel [NHANES]    Frequency of Communication with Friends and Family: More than three times a week    Frequency of Social Gatherings with Friends and Family: More than three times a week    Attends Religious Services: More than 4  times per year    Active Member of Clubs or Organizations: Yes    Attends Banker Meetings: More than 4 times per year    Marital Status: Married  Catering manager Violence: Not At Risk (11/30/2021)   Humiliation, Afraid, Rape, and Kick questionnaire    Fear of Current or Ex-Partner: No    Emotionally Abused: No    Physically Abused: No    Sexually Abused: No    Outpatient Medications Prior to Visit  Medication Sig Dispense Refill   albuterol (VENTOLIN HFA) 108 (90 Base) MCG/ACT inhaler Inhale 2 puffs into the lungs every 6 (six) hours as needed for wheezing or shortness of breath. 8 g 2   amLODipine (NORVASC) 10 MG tablet Take 1 tablet (10 mg total) by mouth every evening. 90 tablet 1   B Complex-C-Folic Acid (RENA-VITE PO) Take 1 tablet by mouth daily.     calcitRIOL (ROCALTROL) 0.5 MCG capsule TAKE 2 CAPSULES BY MOUTH EVERY MONDAY, WEDNESDAY, AND FRIDAY FOR KIDNEY DISEASE. 30 capsule 0   carvedilol (COREG) 6.25 MG tablet Take 1 tablet (6.25 mg total) by mouth 2 (two) times daily with a meal. 60 tablet 2   cetirizine (ZYRTEC) 10 MG tablet TAKE 1 TABLET BY MOUTH ONCE DAILY. 30 tablet 0   multivitamin (RENA-VIT) TABS tablet TAKE (1) TABLET BY MOUTH ONCE DAILY FOR KIDNEY DISEASE. 30 tablet 0    pantoprazole (PROTONIX) 40 MG tablet Take 1 tablet (40 mg total) by mouth daily. 90 tablet 3   sevelamer carbonate (RENVELA) 0.8 g PACK packet SMARTSIG:1 Packet(s) Gastro Tube 3 Times Daily     traMADol (ULTRAM) 50 MG tablet Take 50 mg by mouth 2 (two) times daily as needed.     amantadine (SYMMETREL) 50 MG/5ML solution Take 100 mg by mouth 2 (two) times daily. (Patient not taking: Reported on 03/02/2022)     traZODone (DESYREL) 50 MG tablet Take 0.5-1 tablets (25-50 mg total) by mouth at bedtime as needed for sleep. 30 tablet 3   venlafaxine (EFFEXOR) 75 MG tablet Take 1 tablet every day by oral route with meals for 30 days.     No facility-administered medications prior to visit.    No Known Allergies  ROS Review of Systems  Constitutional:  Negative for chills and fever.  HENT:  Positive for congestion, postnasal drip and sinus pressure. Negative for sinus pain.   Eyes:  Positive for pain, redness and visual disturbance. Negative for discharge.  Respiratory:  Negative for cough and shortness of breath.   Cardiovascular:  Negative for chest pain and palpitations.  Gastrointestinal:  Negative for diarrhea, nausea and vomiting.  Genitourinary:  Negative for dysuria and hematuria.  Musculoskeletal:  Negative for neck pain and neck stiffness.  Skin:  Negative for rash.  Neurological:  Positive for weakness and numbness.  Psychiatric/Behavioral:  Positive for sleep disturbance. Negative for agitation and behavioral problems. The patient is nervous/anxious.       Objective:    Physical Exam Constitutional:      General: She is not in acute distress.    Appearance: She is obese. She is not diaphoretic.     Comments: In wheelchair  HENT:     Head: Normocephalic.     Nose: Congestion present.     Mouth/Throat:     Mouth: Mucous membranes are moist.     Pharynx: No posterior oropharyngeal erythema.  Eyes:     General: No scleral icterus.    Conjunctiva/sclera:  Right eye: Right  conjunctiva is injected.     Pupils: Pupils are equal, round, and reactive to light.  Cardiovascular:     Rate and Rhythm: Normal rate and regular rhythm.     Heart sounds: Normal heart sounds. No murmur heard. Pulmonary:     Breath sounds: Normal breath sounds. No wheezing or rales.  Musculoskeletal:     Cervical back: Neck supple. No tenderness.     Right lower leg: No edema.     Left lower leg: No edema.  Skin:    General: Skin is warm.     Findings: No rash.     Comments: Left UE AV fistula  Neurological:     Mental Status: She is alert and oriented to person, place, and time.     Sensory: Sensory deficit (Left UE and LE) present.     Motor: Weakness (Left UE and LE - 1/5, right UE and LE - 4/5) present.  Psychiatric:        Mood and Affect: Mood normal.        Behavior: Behavior normal.     BP 134/82 (BP Location: Right Arm)   Pulse 67   SpO2 93%  Wt Readings from Last 3 Encounters:  03/04/22 208 lb 5.4 oz (94.5 kg)  10/27/21 210 lb (95.3 kg)  10/05/21 212 lb 11.9 oz (96.5 kg)    Lab Results  Component Value Date   TSH 1.265 08/24/2021   Lab Results  Component Value Date   WBC 4.1 03/04/2022   HGB 11.6 (L) 03/04/2022   HCT 34.0 (L) 03/04/2022   MCV 76.2 (L) 03/04/2022   PLT 138 (L) 03/04/2022   Lab Results  Component Value Date   NA 135 03/04/2022   K 6.5 (HH) 03/04/2022   CO2 28 03/04/2022   GLUCOSE 147 (H) 03/04/2022   BUN 30 (H) 03/04/2022   CREATININE 3.60 (H) 03/04/2022   BILITOT 0.8 03/04/2022   ALKPHOS 68 03/04/2022   AST 36 03/04/2022   ALT 16 03/04/2022   PROT 6.7 03/04/2022   ALBUMIN 3.0 (L) 03/04/2022   CALCIUM 8.2 (L) 03/04/2022   ANIONGAP 9 03/04/2022   Lab Results  Component Value Date   CHOL 178 08/23/2021   Lab Results  Component Value Date   HDL 63 08/23/2021   Lab Results  Component Value Date   LDLCALC 38 08/23/2021   Lab Results  Component Value Date   TRIG 174 (H) 08/28/2021   Lab Results  Component Value Date    CHOLHDL 2.8 08/23/2021   Lab Results  Component Value Date   HGBA1C 5.7 05/25/2022      Assessment & Plan:   Problem List Items Addressed This Visit       Cardiovascular and Mediastinum   Essential hypertension    BP Readings from Last 1 Encounters:  11/15/22 134/82   Well-controlled with amlodipine 10 mg QD and carvedilol 6.25 mg BID Counseled for compliance with the medications Advised to follow renal diet        Respiratory   Allergic sinusitis    Better with Zyrtec, but still has nasal congestion and postnasal drip Added Azelastine nasal spray      Relevant Medications   azelastine (ASTELIN) 0.1 % nasal spray     Endocrine   Type 2 diabetes mellitus (HCC)    Lab Results  Component Value Date   HGBA1C 5.7 05/25/2022   Well-controlled Has not been on Toujeo now Follows up with Nephrologist for  ESRD on HD On kidney transplant list [facility]      Diabetic retinopathy [facility])    Followed by Ophthalmology Recently had vitreous hemorrhage, s/p vitrectomy        Nervous and Auditory   Left hemiplegia (HCC)    Due to right-sided thalamic hemorrhage Currently wheelchair-bound Had referred to home health for PT and home safety evaluation        Other   Primary insomnia    Improved with trazodone as needed Sleep hygiene discussed Recently had sleep study for OSA - awaiting results      Relevant Medications   traZODone (DESYREL) 50 MG tablet   Encounter for general adult medical examination with abnormal findings - Primary    Physical exam as documented. Fasting blood tests reviewed today from HD center. Advised to get Shingrix vaccines at local pharmacy. Prefers to wait for mammography for now.       Meds ordered this encounter  Medications   traZODone (DESYREL) 50 MG tablet    Sig: Take 0.5-1 tablets (25-50 mg total) by mouth at bedtime as needed for sleep.    Dispense:  30 tablet    Refill:  3   azelastine (ASTELIN) 0.1 % nasal spray     Sig: Place 1 spray into both nostrils 2 (two) times daily. Use in each nostril as directed    Dispense:  30 mL    Refill:  5    Follow-up: Return in about 4 months (around 03/17/2023) for DM and HTN.    Anabel Halon, MD

## 2022-11-15 NOTE — Assessment & Plan Note (Signed)
BP Readings from Last 1 Encounters:  11/15/22 134/82   Well-controlled with amlodipine 10 mg QD and carvedilol 6.25 mg BID Counseled for compliance with the medications Advised to follow renal diet

## 2022-11-15 NOTE — Assessment & Plan Note (Signed)
Due to right-sided thalamic hemorrhage Currently wheelchair-bound Had referred to home health for PT and home safety evaluation

## 2022-11-15 NOTE — Assessment & Plan Note (Signed)
Lab Results  Component Value Date   HGBA1C 5.7 05/25/2022   Well-controlled Has not been on Toujeo now Follows up with Nephrologist for ESRD on HD On kidney transplant list at [facility] 

## 2022-11-15 NOTE — Assessment & Plan Note (Signed)
Followed by Ophthalmology Recently had vitreous hemorrhage, s/p vitrectomy

## 2022-11-15 NOTE — Assessment & Plan Note (Signed)
Better with Zyrtec, but still has nasal congestion and postnasal drip Added Azelastine nasal spray

## 2022-11-17 ENCOUNTER — Other Ambulatory Visit: Payer: Self-pay | Admitting: Internal Medicine

## 2022-11-23 ENCOUNTER — Other Ambulatory Visit: Payer: Self-pay | Admitting: Internal Medicine

## 2022-11-23 DIAGNOSIS — G4733 Obstructive sleep apnea (adult) (pediatric): Secondary | ICD-10-CM

## 2022-12-14 ENCOUNTER — Other Ambulatory Visit: Payer: Self-pay

## 2022-12-14 MED ORDER — CALCITRIOL 0.5 MCG PO CAPS: 0.5000 ug | ORAL_CAPSULE | ORAL | 0 refills | Status: DC

## 2022-12-20 ENCOUNTER — Other Ambulatory Visit: Payer: Self-pay | Admitting: Internal Medicine

## 2022-12-20 ENCOUNTER — Ambulatory Visit (INDEPENDENT_AMBULATORY_CARE_PROVIDER_SITE_OTHER): Payer: 59

## 2022-12-20 VITALS — BP 140/82 | Ht 68.0 in | Wt 208.0 lb

## 2022-12-20 DIAGNOSIS — Z1231 Encounter for screening mammogram for malignant neoplasm of breast: Secondary | ICD-10-CM

## 2022-12-20 DIAGNOSIS — Z0000 Encounter for general adult medical examination without abnormal findings: Secondary | ICD-10-CM

## 2022-12-20 DIAGNOSIS — Z1211 Encounter for screening for malignant neoplasm of colon: Secondary | ICD-10-CM

## 2022-12-20 NOTE — Patient Instructions (Signed)
[name] , Thank you for taking time to come for your Medicare Wellness Visit. I appreciate your ongoing commitment to your health goals. Please review the following plan we discussed and let me know if I can assist you in the future.   Referrals/Orders/Follow-Ups/Clinician Recommendations:  You have been referred to [facility].If you haven't heard from them within the next week, please call them to schedule your appointment.    Address: [address], [place], [place] [address] Phone: [phone]  You have an order for:  []   2D Mammogram  [x]   3D Mammogram  []   Bone Density   []   Lung Cancer Screening  Please call for appointment:   [facility] at [facility] [address] [place], [place] [address] [phone]  Make sure to wear two-piece clothing.  No lotions powders or deodorants the day of the appointment Make sure to bring picture ID and insurance card.  Bring list of medications you are currently taking including any supplements.   Schedule your [facility] screening mammogram through MyChart!   Log into your MyChart account.  Go to 'Visit' (or 'Appointments' if on mobile App) --> Schedule an Appointment  Under 'Select a Reason for Visit' choose the Mammogram Screening option.  Complete the pre-visit questions and select the time and place that best fits your schedule.  Follow Up: 1 year March 13, 2024 at 1:50pm virtual visit  This is a list of the screening recommended for you and due dates:  Health Maintenance  Topic Date Due   Zoster (Shingles) Vaccine (1 of 2) Never done   Mammogram  08/13/2021   Colon Cancer Screening  10/23/2021   Flu Shot  10/27/2022   COVID-19 Vaccine (6 - 2023-24 season) 11/27/2022   Hemoglobin A1C  11/23/2022   Pap with HPV screening  05/09/2023   Eye exam for diabetics  09/13/2023   Complete foot exam   10/04/2023   Medicare Annual Wellness Visit  12/20/2023   DTaP/Tdap/Td vaccine (2  - Td or Tdap) 04/07/2029   Hepatitis C Screening  Completed   HIV Screening  Completed   HPV Vaccine  Aged Out    Advanced directives: (Copy Requested) Please bring a copy of your health care power of attorney and living will to the office to be added to your chart at your convenience.  Next Medicare Annual Wellness Visit scheduled for next year: Yes  Preventive Care 48-37 Years Old, Female Preventive care refers to lifestyle choices and visits with your health care provider that can promote health and wellness. Preventive care visits are also called wellness exams. What can I expect for my preventive care visit? Counseling Your health care provider may ask you questions about your: Medical history, including: Past medical problems. Family medical history. Pregnancy history. Current health, including: Menstrual cycle. Method of birth control. Emotional well-being. Home life and relationship well-being. Sexual activity and sexual health. Lifestyle, including: Alcohol, nicotine or tobacco, and drug use. Access to firearms. Diet, exercise, and sleep habits. Work and work Astronomer. Sunscreen use. Safety issues such as seatbelt and bike helmet use. Physical exam Your health care provider will check your: Height and weight. These may be used to calculate your BMI (body mass index). BMI is a measurement that tells if you are at a healthy weight. Waist circumference. This measures the distance around your waistline. This measurement also tells if you are at a healthy weight and may help predict your risk of certain diseases,  such as type 2 diabetes and high blood pressure. Heart rate and blood pressure. Body temperature. Skin for abnormal spots. What immunizations do I need?  Vaccines are usually given at various ages, according to a schedule. Your health care provider will recommend vaccines for you based on your age, medical history, and lifestyle or other factors, such as  travel or where you work. What tests do I need? Screening Your health care provider may recommend screening tests for certain conditions. This may include: Lipid and cholesterol levels. Diabetes screening. This is done by checking your blood sugar (glucose) after you have not eaten for a while (fasting). Pelvic exam and Pap test. Hepatitis B test. Hepatitis C test. HIV (human immunodeficiency virus) test. STI (sexually transmitted infection) testing, if you are at risk. Lung cancer screening. Colorectal cancer screening. Mammogram. Talk with your health care provider about when you should start having regular mammograms. This may depend on whether you have a family history of breast cancer. BRCA-related cancer screening. This may be done if you have a family history of breast, ovarian, tubal, or peritoneal cancers. Bone density scan. This is done to screen for osteoporosis. Talk with your health care provider about your test results, treatment options, and if necessary, the need for more tests. Follow these instructions at home: Eating and drinking  Eat a diet that includes fresh fruits and vegetables, whole grains, lean protein, and low-fat dairy products. Take vitamin and mineral supplements as recommended by your health care provider. Do not drink alcohol if: Your health care provider tells you not to drink. You are pregnant, may be pregnant, or are planning to become pregnant. If you drink alcohol: Limit how much you have to 0-1 drink a day. Know how much alcohol is in your drink. In the [place] one drink equals one 12 oz bottle of beer (355 mL), one 5 oz glass of wine (148 mL), or one 1 oz glass of hard liquor (44 mL). Lifestyle Brush your teeth every morning and night with fluoride toothpaste. Floss one time each day. Exercise for at least 30 minutes 5 or more days each week. Do not use any products that contain nicotine or tobacco. These products include cigarettes, chewing  tobacco, and vaping devices, such as e-cigarettes. If you need help quitting, ask your health care provider. Do not use drugs. If you are sexually active, practice safe sex. Use a condom or other form of protection to prevent STIs. If you do not wish to become pregnant, use a form of birth control. If you plan to become pregnant, see your health care provider for a prepregnancy visit. Take aspirin only as told by your health care provider. Make sure that you understand how much to take and what form to take. Work with your health care provider to find out whether it is safe and beneficial for you to take aspirin daily. Find healthy ways to manage stress, such as: Meditation, yoga, or listening to music. Journaling. Talking to a trusted person. Spending time with friends and family. Minimize exposure to UV radiation to reduce your risk of skin cancer. Safety Always wear your seat belt while driving or riding in a vehicle. Do not drive: If you have been drinking alcohol. Do not ride with someone who has been drinking. When you are tired or distracted. While texting. If you have been using any mind-altering substances or drugs. Wear a helmet and other protective equipment during sports activities. If you have firearms in your house, make  sure you follow all gun safety procedures. Seek help if you have been physically or sexually abused. What's next? Visit your health care provider once a year for an annual wellness visit. Ask your health care provider how often you should have your eyes and teeth checked. Stay up to date on all vaccines. This information is not intended to replace advice given to you by your health care provider. Make sure you discuss any questions you have with your health care provider. Document Revised: 09/09/2020 Document Reviewed: 09/09/2020 Elsevier Patient Education  2024 [employer].

## 2022-12-20 NOTE — Progress Notes (Signed)
Because this visit was a virtual/telehealth visit,  certain criteria was not obtained, such a blood pressure, CBG if applicable, and timed get up and go. Any medications not marked as "taking" were not mentioned during the medication reconciliation part of the visit. Any vitals not documented were not able to be obtained due to this being a telehealth visit or patient was unable to self-report a recent blood pressure reading due to a lack of equipment at home via telehealth. Vitals that have been documented are verbally provided by the patient.   Subjective:   Janet Mitchell is a 57 y.o. female who presents for Medicare Annual (Subsequent) preventive examination.  Visit Complete: Virtual  I connected with  Liora Amrine Mitchell on 12/20/22 by a audio enabled telemedicine application and verified that I am speaking with the correct person using two identifiers.  Patient Location: Home  Provider Location: Home Office  I discussed the limitations of evaluation and management by telemedicine. The patient expressed understanding and agreed to proceed.  Patient Medicare AWV questionnaire was completed by the patient on na; I have confirmed that all information answered by patient is correct and no changes since this date.        Objective:    Today's Vitals   12/20/22 1129  BP: (!) 140/82  Weight: 208 lb (94.3 kg)  Height: 5\' 8"  (1.727 m)   Body mass index is 31.63 kg/m.     12/20/2022   11:29 AM 03/04/2022   10:51 AM 02/24/2022    8:37 PM 11/30/2021   11:58 AM 10/27/2021   10:12 AM 06/10/2020    2:06 PM 10/17/2019    6:56 AM  Advanced Directives  Does Patient Have a Medical Advance Directive? Yes No No Yes No No No  Type of Estate agent of [place];Living will   Healthcare Power of [place];Living will     Does patient want to make changes to medical advance directive?    No - Patient declined     Copy of Healthcare Power of Attorney in Chart? No - copy  requested   No - copy requested     Would patient like information on creating a medical advance directive?  No - Patient declined    No - Patient declined No - Patient declined    Current Medications (verified) Outpatient Encounter Medications as of 12/20/2022  Medication Sig   albuterol (VENTOLIN HFA) 108 (90 Base) MCG/ACT inhaler Inhale 2 puffs into the lungs every 6 (six) hours as needed for wheezing or shortness of breath.   amLODipine (NORVASC) 10 MG tablet Take 1 tablet (10 mg total) by mouth every evening.   azelastine (ASTELIN) 0.1 % nasal spray Place 1 spray into both nostrils 2 (two) times daily. Use in each nostril as directed   B Complex-C-Folic Acid (RENA-VITE PO) Take 1 tablet by mouth daily.   calcitRIOL (ROCALTROL) 0.5 MCG capsule Take 1 capsule (0.5 mcg total) by mouth every Monday, Wednesday, and Friday.   carvedilol (COREG) 6.25 MG tablet Take 1 tablet (6.25 mg total) by mouth 2 (two) times daily with a meal.   cetirizine (ZYRTEC) 10 MG tablet TAKE 1 TABLET BY MOUTH ONCE DAILY.   multivitamin (RENA-VIT) TABS tablet TAKE (1) TABLET BY MOUTH ONCE DAILY FOR KIDNEY DISEASE.   pantoprazole (PROTONIX) 40 MG tablet Take 1 tablet (40 mg total) by mouth daily.   sevelamer carbonate (RENVELA) 0.8 g PACK packet SMARTSIG:1 Packet(s) Gastro Tube 3 Times Daily   traMADol (ULTRAM)  50 MG tablet Take 50 mg by mouth 2 (two) times daily as needed.   traZODone (DESYREL) 50 MG tablet Take 0.5-1 tablets (25-50 mg total) by mouth at bedtime as needed for sleep.   No facility-administered encounter medications on file as of 12/20/2022.    Allergies (verified) Patient has no known allergies.   History: Past Medical History:  Diagnosis Date   Abdominal wall hematoma 12/26/2019   Anemia    Arthritis    CHF (congestive heart failure) (HCC)    pt states she has been cleared of heart failure/disease   Chronic cholecystitis with calculus    COVID-19 03/26/2019   COVID-19 virus infection  03/2019   ESRD (end stage renal disease) on dialysis [facility])    Dialysis M-W-F   Headache    "a few/wk" (03/09/2018) - no longer having these   History of blood transfusion 10/2017   "low blood count" (03/09/2018)   Hypertension    OSA (obstructive sleep apnea) 10/04/2022   Respiratory failure requiring intubation (HCC)    Seasonal allergies    Spinal headache    Status post insertion of percutaneous endoscopic gastrostomy (PEG) tube (HCC) 01/26/2022   Type II diabetes mellitus (HCC)    Past Surgical History:  Procedure Laterality Date   AMPUTATION TOE Left 2013   Great toe   AV FISTULA PLACEMENT Left 07/22/2019   Procedure: LEFT ARM BASILIC ARTERIOVENOUS (AV) FISTULA CREATION;  Surgeon: Larina Earthly, MD;  Location: [facility] OR;  Service: Vascular;  Laterality: Left;   BASCILIC VEIN TRANSPOSITION Left 10/17/2019   Procedure: LEFT SECOND STAGE BASCILIC VEIN TRANSPOSITION;  Surgeon: Larina Earthly, MD;  Location: [facility] OR;  Service: Vascular;  Laterality: Left;   BIOPSY  10/24/2018   Procedure: BIOPSY;  Surgeon: Corbin Ade, MD;  Location: [facility] ENDO SUITE;  Service: Endoscopy;;  right and left colon   CATARACT EXTRACTION W/ INTRAOCULAR LENS IMPLANT Right    CESAREAN SECTION  1994; 1998   CHOLECYSTECTOMY N/A 03/09/2018   Procedure: LAPAROSCOPIC CHOLECYSTECTOMY WITH INTRAOPERATIVE CHOLANGIOGRAM ERAS PATHWAY;  Surgeon: Manus Rudd, MD;  Location: [facility] OR;  Service: General;  Laterality: N/A;   COLONOSCOPY N/A 10/24/2018   Surgeon: Corbin Ade, MD; 15 mm adenomatous polyp in the transverse colon removed, segmental biopsies benign.   EYE SURGERY Left 05/15/2018   Removal of blood in the globe (due to DM)   FLEXIBLE SIGMOIDOSCOPY N/A 11/24/2017   Procedure: FLEXIBLE SIGMOIDOSCOPY;  Surgeon: Corbin Ade, MD;  Location: [facility];  Service: Endoscopy;  Laterality: N/A;   IR FLUORO GUIDE CV LINE RIGHT  07/17/2019   IR GASTROSTOMY TUBE MOD SED  09/16/2021   IR PARACENTESIS  07/09/2019    IR US GUIDE VASC ACCESS RIGHT  07/17/2019   LAPAROSCOPIC CHOLECYSTECTOMY  03/09/2018   LAPAROSCOPIC REVISION VENTRICULAR-PERITONEAL (V-P) SHUNT  09/08/2021   Procedure: LAPAROSCOPIC ASSISTANCE FOR  VENTRICULAR-PERITONEAL (V-P) SHUNT PLACEMENT ;  Surgeon: Bedelia Person, MD;  Location: [facility] OR;  Service: Neurosurgery;;   PERITONEAL CATHETER INSERTION  2017   POLYPECTOMY  10/24/2018   Procedure: POLYPECTOMY;  Surgeon: Corbin Ade, MD;  [facility];  Service: Endoscopy;;   TOTAL HIP ARTHROPLASTY Left 1997   VENTRICULOPERITONEAL SHUNT N/A 09/08/2021   Procedure: SHUNT INSERTION VENTRICULAR-PERITONEAL;  Surgeon: Bedelia Person, MD;  Location: [facility] OR;  Service: Neurosurgery;  Laterality: N/A;   Family History  Problem Relation Age of Onset   Heart disease Mother    Thrombocytopenia Mother  TTP   Heart failure Father    Kidney disease Paternal Grandfather    Colon cancer Neg Hx    Social History   Socioeconomic History   Marital status: Married    Spouse name: Not on file   Number of children: 2   Years of education: Not on file   Highest education level: Not on file  Occupational History   Not on file  Tobacco Use   Smoking status: Never    Passive exposure: Yes   Smokeless tobacco: Never  Vaping Use   Vaping status: Never Used  Substance and Sexual Activity   Alcohol use: Never   Drug use: Never   Sexual activity: Yes    Birth control/protection: I.U.D.    Comment: Mirena IUD  Other Topics Concern   Not on file  Social History Narrative   Not on file   Social Determinants of Health   Financial Resource Strain: Low Risk  (12/20/2022)   Overall Financial Resource Strain (CARDIA)    Difficulty of Paying Living Expenses: Not hard at all  Food Insecurity: No Food Insecurity (12/20/2022)   Hunger Vital Sign    Worried About Running Out of Food in the Last Year: Never true    Ran Out of Food in the Last Year: Never true  Transportation Needs: No  Transportation Needs (12/20/2022)   PRAPARE - Administrator, Civil Service (Medical): No    Lack of Transportation (Non-Medical): No  Physical Activity: Inactive (12/20/2022)   Exercise Vital Sign    Days of Exercise per Week: 0 days    Minutes of Exercise per Session: 0 min  Stress: No Stress Concern Present (12/20/2022)   [employer] of Occupational Health - Occupational Stress Questionnaire    Feeling of Stress : Only a little  Social Connections: Moderately Integrated (12/20/2022)   Social Connection and Isolation Panel [NHANES]    Frequency of Communication with Friends and Family: More than three times a week    Frequency of Social Gatherings with Friends and Family: More than three times a week    Attends Religious Services: More than 4 times per year    Active Member of [employer] or Organizations: No    Attends Engineer, structural: Never    Marital Status: Married    Tobacco Counseling Counseling given: Yes   Clinical Intake:  Pre-visit preparation completed: Yes  Pain : No/denies pain     BMI - recorded: 31.63 Nutritional Status: BMI > 30  Obese Nutritional Risks: None Diabetes: Yes CBG done?: No (telehealth visit) Did pt. bring in CBG monitor from home?: No  How often do you need to have someone help you when you read instructions, pamphlets, or other written materials from your doctor or pharmacy?: 1 - Never  Interpreter Needed?: No  Information entered by :: Abby Shiana Rappleye, CMA   Activities of Daily Living     No data to display          Patient Care Team: Anabel Halon, MD as PCP - General (Internal Medicine) Jena Gauss, Gerrit Friends, MD as Consulting Physician (Gastroenterology) Dialysis, Nolon Lennert  Indicate any recent Medical Services you may have received from other than [facility] providers in the past year (date may be approximate).     Assessment:   This is a routine wellness examination for [facility].  Hearing/Vision  screen Hearing Screening - Comments:: Patient denies any hearing difficulties.   Vision Screening - Comments:: Wears rx glasses - up  to date with routine eye exams  Sees [facility]   Goals Addressed             This Visit's Progress    Patient Stated       To be more ambulatory       Depression Screen    12/20/2022   11:34 AM 11/15/2022    3:09 PM 10/04/2022    2:35 PM 05/31/2022    1:24 PM 01/26/2022    2:15 PM 11/30/2021   11:59 AM 11/30/2021   11:58 AM  PHQ 2/9 Scores  PHQ - 2 Score 0 0 0 0 0 0 0    Fall Risk    12/20/2022   11:37 AM 11/15/2022    3:09 PM 10/04/2022    2:35 PM 05/31/2022    1:24 PM 01/26/2022    2:14 PM  Fall Risk   Falls in the past year? 0 0 0 0 0  Number falls in past yr: 0 0 0 0 0  Injury with Fall? 0 0 0 0 0  Risk for fall due to : Impaired balance/gait;Impaired mobility    No Fall Risks  Follow up Falls prevention discussed;Education provided    Falls evaluation completed    MEDICARE RISK AT HOME: Medicare Risk at Home Any stairs in or around the home?: No Home free of loose throw rugs in walkways, pet beds, electrical cords, etc?: Yes Adequate lighting in your home to reduce risk of falls?: Yes Life alert?: No Use of a cane, walker or w/c?: Yes Grab bars in the bathroom?: Yes Shower chair or bench in shower?: Yes Elevated toilet seat or a handicapped toilet?: Yes  TIMED UP AND GO:  Was the test performed?  No    Cognitive Function:        12/20/2022   11:33 AM 11/30/2021   11:59 AM 06/10/2020    1:59 PM  6CIT Screen  What Year? 0 points 0 points 0 points  What month? 0 points 0 points 0 points  What time? 0 points 0 points 0 points  Count back from 20 0 points 0 points 0 points  Months in reverse 0 points 0 points 0 points  Repeat phrase 0 points 0 points 0 points  Total Score 0 points 0 points 0 points    Immunizations Immunization History  Administered Date(s) Administered   Hepatitis B, PED/ADOLESCENT 11/16/2015,  12/11/2015, 01/08/2016, 02/26/2016   Influenza Split 05/27/2015, 12/11/2015, 01/04/2016, 01/27/2017, 01/15/2018, 01/03/2019   Influenza,inj,Quad PF,6+ Mos 01/03/2022   Influenza,inj,Quad PF,6-35 Mos 12/27/2019   Influenza-Unspecified 05/27/2015, 12/11/2015, 01/04/2016, 01/27/2017, 01/15/2018, 01/03/2019, 12/26/2019, 01/22/2021   Moderna Sars-Covid-2 Vaccination 06/08/2019, 08/09/2019, 08/29/2019   PPD Test 05/03/2016, 05/05/2017, 05/14/2018, 01/10/2020, 02/15/2021   Pneumococcal Conjugate-13 05/27/2015, 04/06/2018   Tdap 04/08/2019   Unspecified SARS-COV-2 Vaccination 06/14/2019, 08/09/2019    TDAP status: Up to date  Flu Vaccine status: Due, Education has been provided regarding the importance of this vaccine. Advised may receive this vaccine at local pharmacy or Health Dept. Aware to provide a copy of the vaccination record if obtained from local pharmacy or Health Dept. Verbalized acceptance and understanding.  Pneumococcal vaccine status: Not age appropriate for this patient.    Covid-19 vaccine status: Information provided on how to obtain vaccines.   Qualifies for Shingles Vaccine? Yes   Zostavax completed No   Shingrix Completed?: No.    Education has been provided regarding the importance of this vaccine. Patient has been advised to call insurance  company to determine out of pocket expense if they have not yet received this vaccine. Advised may also receive vaccine at local pharmacy or Health Dept. Verbalized acceptance and understanding.  Screening Tests Health Maintenance  Topic Date Due   Zoster Vaccines- Shingrix (1 of 2) Never done   MAMMOGRAM  08/13/2021   INFLUENZA VACCINE  10/27/2022   COVID-19 Vaccine (6 - 2023-24 season) 11/27/2022   HEMOGLOBIN A1C  11/23/2022   Cervical Cancer Screening (HPV/Pap Cotest)  05/09/2023   OPHTHALMOLOGY EXAM  09/13/2023   FOOT EXAM  10/04/2023   Medicare Annual Wellness (AWV)  12/20/2023   Colonoscopy  10/23/2028   DTaP/Tdap/Td (2 -  Td or Tdap) 04/07/2029   Hepatitis C Screening  Completed   HIV Screening  Completed   HPV VACCINES  Aged Out    Health Maintenance  Health Maintenance Due  Topic Date Due   Zoster Vaccines- Shingrix (1 of 2) Never done   MAMMOGRAM  08/13/2021   INFLUENZA VACCINE  10/27/2022   COVID-19 Vaccine (6 - 2023-24 season) 11/27/2022   HEMOGLOBIN A1C  11/23/2022    Colorectal cancer screening: Referral to GI placed 12/20/22. Pt aware the office will call re: appt.  Mammogram status: Ordered 12/20/22. Pt provided with contact info and advised to call to schedule appt.   Bone Density Screening: Not age appropriate for this patient.    Lung Cancer Screening: (Low Dose CT Chest recommended if Age 26-80 years, 20 pack-year currently smoking OR have quit w/in 15years.) does not qualify.   Lung Cancer Screening Referral: na  Additional Screening:  Hepatitis C Screening: does not qualify; Completed 09/05/2021  Vision Screening: Recommended annual ophthalmology exams for early detection of glaucoma and other disorders of the eye. Is the patient up to date with their annual eye exam?  Yes  Who is the provider or what is the name of the office in which the patient attends annual eye exams? [facility] If pt is not established with a provider, would they like to be referred to a provider to establish care? No .   Dental Screening: Recommended annual dental exams for proper oral hygiene  Diabetic Foot Exam: Diabetic Foot Exam: Completed 10/04/2022  Community Resource Referral / Chronic Care Management: CRR required this visit?  No   CCM required this visit?  No     Plan:     I have personally reviewed and noted the following in the patient's chart:   Medical and social history Use of alcohol, tobacco or illicit drugs  Current medications and supplements including opioid prescriptions. Patient is not currently taking opioid prescriptions. Functional ability and status Nutritional  status Physical activity Advanced directives List of other physicians Hospitalizations, surgeries, and ER visits in previous 12 months Vitals Screenings to include cognitive, depression, and falls Referrals and appointments  In addition, I have reviewed and discussed with patient certain preventive protocols, quality metrics, and best practice recommendations. A written personalized care plan for preventive services as well as general preventive health recommendations were provided to patient.     Jordan Hawks Mackenzey Crownover, CMA   12/20/2022   After Visit Summary: (MyChart) Due to this being a telephonic visit, the after visit summary with patients personalized plan was offered to patient via MyChart   Nurse Notes: n/a

## 2023-01-10 ENCOUNTER — Other Ambulatory Visit: Payer: Self-pay | Admitting: Internal Medicine

## 2023-01-10 ENCOUNTER — Other Ambulatory Visit: Payer: Self-pay

## 2023-01-10 MED ORDER — RENA-VITE PO TABS: 1.0000 | ORAL_TABLET | Freq: Every day | ORAL | 0 refills | Status: DC

## 2023-01-11 ENCOUNTER — Ambulatory Visit (INDEPENDENT_AMBULATORY_CARE_PROVIDER_SITE_OTHER): Payer: 59 | Admitting: Internal Medicine

## 2023-01-11 ENCOUNTER — Encounter: Payer: Self-pay | Admitting: Internal Medicine

## 2023-01-11 VITALS — BP 130/85 | HR 65

## 2023-01-11 DIAGNOSIS — G8194 Hemiplegia, unspecified affecting left nondominant side: Secondary | ICD-10-CM

## 2023-01-11 DIAGNOSIS — N185 Chronic kidney disease, stage 5: Secondary | ICD-10-CM | POA: Diagnosis not present

## 2023-01-11 DIAGNOSIS — J0100 Acute maxillary sinusitis, unspecified: Secondary | ICD-10-CM | POA: Insufficient documentation

## 2023-01-11 DIAGNOSIS — E1122 Type 2 diabetes mellitus with diabetic chronic kidney disease: Secondary | ICD-10-CM | POA: Diagnosis not present

## 2023-01-11 DIAGNOSIS — Z794 Long term (current) use of insulin: Secondary | ICD-10-CM

## 2023-01-11 DIAGNOSIS — N3946 Mixed incontinence: Secondary | ICD-10-CM

## 2023-01-11 LAB — BAYER DCA HB A1C WAIVED: HB A1C (BAYER DCA - WAIVED): 7.6 % — ABNORMAL HIGH (ref 4.8–5.6)

## 2023-01-11 MED ORDER — GUAIFENESIN-CODEINE 100-10 MG/5ML PO SYRP
5.0000 mL | ORAL_SOLUTION | Freq: Three times a day (TID) | ORAL | 0 refills | Status: DC | PRN
Start: 2023-01-11 — End: 2023-08-10

## 2023-01-11 MED ORDER — AZITHROMYCIN 250 MG PO TABS
ORAL_TABLET | ORAL | 0 refills | Status: AC
Start: 2023-01-11 — End: 2023-01-16

## 2023-01-11 MED ORDER — GLIPIZIDE ER 5 MG PO TB24
5.0000 mg | ORAL_TABLET | Freq: Every day | ORAL | 3 refills | Status: DC
Start: 2023-01-11 — End: 2023-05-03

## 2023-01-11 NOTE — Assessment & Plan Note (Signed)
Lab Results  Component Value Date   HGBA1C 7.6 (H) 01/11/2023   Uncontrolled Has not been on Toujeo now Started Glipizide 5 mg QD Follows up with Nephrologist for ESRD on HD On kidney transplant list at [facility]

## 2023-01-11 NOTE — Assessment & Plan Note (Signed)
Has nasal congestion, sinus pressure related headache and postnasal drip Started empiric azithromycin Cheratussin as needed for cough Continue Astelin nasal spray for allergies

## 2023-01-11 NOTE — Assessment & Plan Note (Signed)
Due to right-sided thalamic hemorrhage Currently wheelchair-bound Had referred to home health for PT and home safety evaluation Has hospital bed, needs new Hoyer lift for safe moving from wheelchair to bed

## 2023-01-11 NOTE — Patient Instructions (Signed)
Please start taking Azithromycin as prescribed.  Please take Cheratussin as needed for cough.  Please use Flonase for nasal congestion.

## 2023-01-11 NOTE — Progress Notes (Addendum)
Acute Office Visit  Subjective:    Patient ID: Janet Mitchell, female    DOB: Oct 11, 1965, 57 y.o.   MRN: [id number]  Chief Complaint  Patient presents with   URI    Cough , congestion , no fever for two weeks    HPI Patient is in today for complaint of nasal congestion, sinus pressure related facial pain and cough with clear expectoration for the last 2 weeks.  She denies any fever or chills.  Denies any recent dyspnea or wheezing.  Her husband reports that he has difficulty placing her in her hospital bed at home due to malfunctioning [facility] lift.  He requests a new prescription for [facility] lift.  She has history of left hemiplegia from CVA.  Due to her body habitus, it is difficult for manual lifting and placing her in bed.  Type II DM: Her HbA1c is 7.6 now, significantly elevated, compared to prior - 5.7 in 02/24.  She was able to come off of insulin with well controlled type II DM in the past.  Past Medical History:  Diagnosis Date   Abdominal wall hematoma 12/26/2019   Anemia    Arthritis    CHF (congestive heart failure) (HCC)    pt states she has been cleared of heart failure/disease   Chronic cholecystitis with calculus    COVID-19 03/26/2019   COVID-19 virus infection 03/2019   ESRD (end stage renal disease) on dialysis [facility])    Dialysis M-W-F   Headache    "a few/wk" (03/09/2018) - no longer having these   History of blood transfusion 10/2017   "low blood count" (03/09/2018)   Hypertension    OSA (obstructive sleep apnea) 10/04/2022   Respiratory failure requiring intubation (HCC)    Seasonal allergies    Spinal headache    Status post insertion of percutaneous endoscopic gastrostomy (PEG) tube (HCC) 01/26/2022   Type II diabetes mellitus (HCC)     Past Surgical History:  Procedure Laterality Date   AMPUTATION TOE Left 2013   Great toe   AV FISTULA PLACEMENT Left 07/22/2019   Procedure: LEFT ARM BASILIC ARTERIOVENOUS (AV) FISTULA CREATION;  Surgeon: Larina Earthly, MD;  Location: [facility] OR;  Service: Vascular;  Laterality: Left;   BASCILIC VEIN TRANSPOSITION Left 10/17/2019   Procedure: LEFT SECOND STAGE BASCILIC VEIN TRANSPOSITION;  Surgeon: Larina Earthly, MD;  Location: [facility] OR;  Service: Vascular;  Laterality: Left;   BIOPSY  10/24/2018   Procedure: BIOPSY;  Surgeon: Corbin Ade, MD;  Location: [facility] ENDO SUITE;  Service: Endoscopy;;  right and left colon   CATARACT EXTRACTION W/ INTRAOCULAR LENS IMPLANT Right    CESAREAN SECTION  1994; 1998   CHOLECYSTECTOMY N/A 03/09/2018   Procedure: LAPAROSCOPIC CHOLECYSTECTOMY WITH INTRAOPERATIVE CHOLANGIOGRAM ERAS PATHWAY;  Surgeon: Manus Rudd, MD;  Location: [facility] OR;  Service: General;  Laterality: N/A;   COLONOSCOPY N/A 10/24/2018   Surgeon: Corbin Ade, MD; 15 mm adenomatous polyp in the transverse colon removed, segmental biopsies benign.   EYE SURGERY Left 05/15/2018   Removal of blood in the globe (due to DM)   FLEXIBLE SIGMOIDOSCOPY N/A 11/24/2017   Procedure: FLEXIBLE SIGMOIDOSCOPY;  Surgeon: Corbin Ade, MD;  Location: [facility];  Service: Endoscopy;  Laterality: N/A;   IR FLUORO GUIDE CV LINE RIGHT  07/17/2019   IR GASTROSTOMY TUBE MOD SED  09/16/2021   IR PARACENTESIS  07/09/2019   IR US GUIDE VASC ACCESS RIGHT  07/17/2019   LAPAROSCOPIC CHOLECYSTECTOMY  03/09/2018   LAPAROSCOPIC REVISION VENTRICULAR-PERITONEAL (V-P) SHUNT  09/08/2021   Procedure: LAPAROSCOPIC ASSISTANCE FOR  VENTRICULAR-PERITONEAL (V-P) SHUNT PLACEMENT ;  Surgeon: Bedelia Person, MD;  Location: [facility] OR;  Service: Neurosurgery;;   PERITONEAL CATHETER INSERTION  2017   POLYPECTOMY  10/24/2018   Procedure: POLYPECTOMY;  Surgeon: Corbin Ade, MD;  Location: [facility];  Service: Endoscopy;;   TOTAL HIP ARTHROPLASTY Left 1997   VENTRICULOPERITONEAL SHUNT N/A 09/08/2021   Procedure: SHUNT INSERTION VENTRICULAR-PERITONEAL;  Surgeon: Bedelia Person, MD;  Location: [facility] OR;  Service: Neurosurgery;  Laterality:  N/A;    Family History  Problem Relation Age of Onset   Heart disease Mother    Thrombocytopenia Mother        TTP   Heart failure Father    Kidney disease Paternal Grandfather    Colon cancer Neg Hx     Social History   Socioeconomic History   Marital status: Married    Spouse name: Not on file   Number of children: 2   Years of education: Not on file   Highest education level: Not on file  Occupational History   Not on file  Tobacco Use   Smoking status: Never    Passive exposure: Yes   Smokeless tobacco: Never  Vaping Use   Vaping status: Never Used  Substance and Sexual Activity   Alcohol use: Never   Drug use: Never   Sexual activity: Yes    Birth control/protection: I.U.D.    Comment: Mirena IUD  Other Topics Concern   Not on file  Social History Narrative   Not on file   Social Determinants of Health   Financial Resource Strain: Low Risk  (12/20/2022)   Overall Financial Resource Strain (CARDIA)    Difficulty of Paying Living Expenses: Not hard at all  Food Insecurity: No Food Insecurity (12/20/2022)   Hunger Vital Sign    Worried About Running Out of Food in the Last Year: Never true    Ran Out of Food in the Last Year: Never true  Transportation Needs: No Transportation Needs (12/20/2022)   PRAPARE - Administrator, Civil Service (Medical): No    Lack of Transportation (Non-Medical): No  Physical Activity: Inactive (12/20/2022)   Exercise Vital Sign    Days of Exercise per Week: 0 days    Minutes of Exercise per Session: 0 min  Stress: No Stress Concern Present (12/20/2022)   [employer] of Occupational Health - Occupational Stress Questionnaire    Feeling of Stress : Only a little  Social Connections: Moderately Integrated (12/20/2022)   Social Connection and Isolation Panel [NHANES]    Frequency of Communication with Friends and Family: More than three times a week    Frequency of Social Gatherings with Friends and Family: More than  three times a week    Attends Religious Services: More than 4 times per year    Active Member of [employer] or Organizations: No    Attends Banker Meetings: Never    Marital Status: Married  Catering manager Violence: Not At Risk (12/20/2022)   Humiliation, Afraid, Rape, and Kick questionnaire    Fear of Current or Ex-Partner: No    Emotionally Abused: No    Physically Abused: No    Sexually Abused: No    Outpatient Medications Prior to Visit  Medication Sig Dispense Refill   albuterol (VENTOLIN HFA) 108 (90 Base) MCG/ACT inhaler Inhale 2 puffs into the lungs every 6 (six)  hours as needed for wheezing or shortness of breath. 8 g 2   amLODipine (NORVASC) 10 MG tablet Take 1 tablet (10 mg total) by mouth every evening. 90 tablet 1   azelastine (ASTELIN) 0.1 % nasal spray Place 1 spray into both nostrils 2 (two) times daily. Use in each nostril as directed 30 mL 5   B Complex-C-Folic Acid (RENA-VITE PO) Take 1 tablet by mouth daily.     pantoprazole (PROTONIX) 40 MG tablet Take 1 tablet (40 mg total) by mouth daily. 90 tablet 3   sevelamer carbonate (RENVELA) 0.8 g PACK packet SMARTSIG:1 Packet(s) Gastro Tube 3 Times Daily     traMADol (ULTRAM) 50 MG tablet Take 50 mg by mouth 2 (two) times daily as needed.     traZODone (DESYREL) 50 MG tablet Take 0.5-1 tablets (25-50 mg total) by mouth at bedtime as needed for sleep. 30 tablet 3   calcitRIOL (ROCALTROL) 0.5 MCG capsule Take 1 capsule (0.5 mcg total) by mouth every Monday, Wednesday, and Friday. 30 capsule 0   carvedilol (COREG) 6.25 MG tablet Take 1 tablet (6.25 mg total) by mouth 2 (two) times daily with a meal. 60 tablet 2   cetirizine (ZYRTEC) 10 MG tablet TAKE 1 TABLET BY MOUTH ONCE DAILY. 30 tablet 0   No facility-administered medications prior to visit.    No Known Allergies  Review of Systems  Constitutional:  Negative for chills and fever.  HENT:  Positive for congestion, postnasal drip and sinus pressure. Negative  for sinus pain.   Eyes:  Positive for pain, redness and visual disturbance. Negative for discharge.  Respiratory:  Negative for cough and shortness of breath.   Cardiovascular:  Negative for chest pain and palpitations.  Gastrointestinal:  Negative for diarrhea, nausea and vomiting.  Genitourinary:  Negative for dysuria and hematuria.  Musculoskeletal:  Negative for neck pain and neck stiffness.  Skin:  Negative for rash.  Neurological:  Positive for weakness and numbness.  Psychiatric/Behavioral:  Positive for sleep disturbance. Negative for agitation and behavioral problems. The patient is nervous/anxious.        Objective:    Physical Exam Constitutional:      General: She is not in acute distress.    Appearance: She is obese. She is not diaphoretic.     Comments: In wheelchair  HENT:     Head: Normocephalic.     Nose: Congestion present.     Right Sinus: Maxillary sinus tenderness present.     Left Sinus: Maxillary sinus tenderness present.     Mouth/Throat:     Mouth: Mucous membranes are moist.     Pharynx: No posterior oropharyngeal erythema.  Eyes:     General: No scleral icterus.    Conjunctiva/sclera:     Right eye: Right conjunctiva is injected.     Pupils: Pupils are equal, round, and reactive to light.  Cardiovascular:     Rate and Rhythm: Normal rate and regular rhythm.     Heart sounds: Normal heart sounds. No murmur heard. Pulmonary:     Breath sounds: Normal breath sounds. No wheezing or rales.  Musculoskeletal:     Cervical back: Neck supple. No tenderness.     Right lower leg: No edema.     Left lower leg: No edema.  Skin:    General: Skin is warm.     Findings: No rash.     Comments: Left UE AV fistula  Neurological:     Mental Status: She is alert and  oriented to person, place, and time.     Sensory: Sensory deficit (Left UE and LE) present.     Motor: Weakness (Left UE and LE - 1/5, right UE and LE - 4/5) present.  Psychiatric:        Mood and  Affect: Mood normal.        Behavior: Behavior normal.     BP 130/85 (BP Location: Right Arm, Patient Position: Sitting, Cuff Size: Normal)   Pulse 65   SpO2 92%  Wt Readings from Last 3 Encounters:  12/20/22 208 lb (94.3 kg)  03/04/22 208 lb 5.4 oz (94.5 kg)  10/27/21 210 lb (95.3 kg)        Assessment & Plan:   Problem List Items Addressed This Visit       Respiratory   Acute non-recurrent maxillary sinusitis - Primary    Has nasal congestion, sinus pressure related headache and postnasal drip Started empiric azithromycin Cheratussin as needed for cough Continue Astelin nasal spray for allergies      Relevant Medications   guaiFENesin-codeine (ROBITUSSIN AC) 100-10 MG/5ML syrup     Endocrine   Type 2 diabetes mellitus (HCC)    Lab Results  Component Value Date   HGBA1C 7.6 (H) 01/11/2023   Uncontrolled Has not been on Toujeo now Started Glipizide 5 mg QD Follows up with Nephrologist for ESRD on HD On kidney transplant list [facility]      Relevant Medications   glipiZIDE (GLUCOTROL XL) 5 MG 24 hr tablet   Other Relevant Orders   Bayer DCA Hb A1c Waived (Completed)     Nervous and Auditory   Left hemiplegia (HCC)    Due to right-sided thalamic hemorrhage Currently wheelchair-bound Had referred to home health for PT and home safety evaluation Has hospital bed, needs new [employer] lift for safe moving from wheelchair to bed      Relevant Orders   Ambulatory referral to Physical Therapy   For home use only DME Other see comment     Other   Mixed stress and urge urinary incontinence    Due to difficulty with ambulation from left hemiplegia, needs incontinence supplies        Meds ordered this encounter  Medications   azithromycin (ZITHROMAX) 250 MG tablet    Sig: Take 2 tablets on day 1, then 1 tablet daily on days 2 through 5    Dispense:  6 tablet    Refill:  0   guaiFENesin-codeine (ROBITUSSIN AC) 100-10 MG/5ML syrup    Sig: Take 5 mLs by  mouth 3 (three) times daily as needed for cough.    Dispense:  120 mL    Refill:  0   glipiZIDE (GLUCOTROL XL) 5 MG 24 hr tablet    Sig: Take 1 tablet (5 mg total) by mouth daily with breakfast.    Dispense:  30 tablet    Refill:  3     Nichole Neyer Concha Se, MD

## 2023-01-17 ENCOUNTER — Other Ambulatory Visit: Payer: Self-pay | Admitting: Internal Medicine

## 2023-01-17 DIAGNOSIS — I10 Essential (primary) hypertension: Secondary | ICD-10-CM

## 2023-01-19 ENCOUNTER — Other Ambulatory Visit: Payer: Self-pay | Admitting: Internal Medicine

## 2023-01-19 ENCOUNTER — Ambulatory Visit (HOSPITAL_COMMUNITY): Payer: 59 | Attending: Internal Medicine

## 2023-01-19 ENCOUNTER — Other Ambulatory Visit: Payer: Self-pay

## 2023-01-19 DIAGNOSIS — R29898 Other symptoms and signs involving the musculoskeletal system: Secondary | ICD-10-CM | POA: Diagnosis present

## 2023-01-19 DIAGNOSIS — R262 Difficulty in walking, not elsewhere classified: Secondary | ICD-10-CM | POA: Insufficient documentation

## 2023-01-19 DIAGNOSIS — E113553 Type 2 diabetes mellitus with stable proliferative diabetic retinopathy, bilateral: Secondary | ICD-10-CM

## 2023-01-19 DIAGNOSIS — G8194 Hemiplegia, unspecified affecting left nondominant side: Secondary | ICD-10-CM | POA: Insufficient documentation

## 2023-01-19 MED ORDER — CALCITRIOL 0.5 MCG PO CAPS: 0.5000 ug | ORAL_CAPSULE | ORAL | 0 refills | Status: DC

## 2023-01-19 MED ORDER — CETIRIZINE HCL 10 MG PO TABS: 10.0000 mg | ORAL_TABLET | Freq: Every day | ORAL | 0 refills | Status: DC

## 2023-01-19 NOTE — Therapy (Signed)
OUTPATIENT PHYSICAL THERAPY NEURO EVALUATION/DISCHARGE   Patient Name: Janet Mitchell MRN: [id number] DOB:Feb 25, 1966, 57 y.o., female Today's Date: 01/19/2023   PCP: Anabel Halon, MD REFERRING PROVIDER: Anabel Halon, MD  END OF SESSION:  PT End of Session - 01/19/23 0929     Visit Number 1    Number of Visits 1    Authorization Type [employer] Medicare Dual Complete    PT Start Time 0805    PT Stop Time 0845    PT Time Calculation (min) 40 min    Equipment Utilized During Treatment Gait belt    Activity Tolerance Patient tolerated treatment well    Behavior During Therapy [facility] for tasks assessed/performed             Past Medical History:  Diagnosis Date   Abdominal wall hematoma 12/26/2019   Anemia    Arthritis    CHF (congestive heart failure) (HCC)    pt states she has been cleared of heart failure/disease   Chronic cholecystitis with calculus    COVID-19 03/26/2019   COVID-19 virus infection 03/2019   ESRD (end stage renal disease) on dialysis [facility])    Dialysis M-W-F   Headache    "a few/wk" (03/09/2018) - no longer having these   History of blood transfusion 10/2017   "low blood count" (03/09/2018)   Hypertension    OSA (obstructive sleep apnea) 10/04/2022   Respiratory failure requiring intubation (HCC)    Seasonal allergies    Spinal headache    Status post insertion of percutaneous endoscopic gastrostomy (PEG) tube (HCC) 01/26/2022   Type II diabetes mellitus (HCC)    Past Surgical History:  Procedure Laterality Date   AMPUTATION TOE Left 2013   Great toe   AV FISTULA PLACEMENT Left 07/22/2019   Procedure: LEFT ARM BASILIC ARTERIOVENOUS (AV) FISTULA CREATION;  Surgeon: Larina Earthly, MD;  Location: [facility] OR;  Service: Vascular;  Laterality: Left;   BASCILIC VEIN TRANSPOSITION Left 10/17/2019   Procedure: LEFT SECOND STAGE BASCILIC VEIN TRANSPOSITION;  Surgeon: Larina Earthly, MD;  Location: [facility] OR;  Service: Vascular;  Laterality: Left;    BIOPSY  10/24/2018   Procedure: BIOPSY;  Surgeon: Corbin Ade, MD;  Location: [facility] SUITE;  Service: Endoscopy;;  right and left colon   CATARACT EXTRACTION W/ INTRAOCULAR LENS IMPLANT Right    CESAREAN SECTION  1994; 1998   CHOLECYSTECTOMY N/A 03/09/2018   Procedure: LAPAROSCOPIC CHOLECYSTECTOMY WITH INTRAOPERATIVE CHOLANGIOGRAM ERAS PATHWAY;  Surgeon: Manus Rudd, MD;  Location: [facility] OR;  Service: General;  Laterality: N/A;   COLONOSCOPY N/A 10/24/2018   Surgeon: Corbin Ade, MD; 15 mm adenomatous polyp in the transverse colon removed, segmental biopsies benign.   EYE SURGERY Left 05/15/2018   Removal of blood in the globe (due to DM)   FLEXIBLE SIGMOIDOSCOPY N/A 11/24/2017   Procedure: FLEXIBLE SIGMOIDOSCOPY;  Surgeon: Corbin Ade, MD;  [facility];  Service: Endoscopy;  Laterality: N/A;   IR FLUORO GUIDE CV LINE RIGHT  07/17/2019   IR GASTROSTOMY TUBE MOD SED  09/16/2021   IR PARACENTESIS  07/09/2019   IR US GUIDE VASC ACCESS RIGHT  07/17/2019   LAPAROSCOPIC CHOLECYSTECTOMY  03/09/2018   LAPAROSCOPIC REVISION VENTRICULAR-PERITONEAL (V-P) SHUNT  09/08/2021   Procedure: LAPAROSCOPIC ASSISTANCE FOR  VENTRICULAR-PERITONEAL (V-P) SHUNT PLACEMENT ;  Surgeon: Bedelia Person, MD;  Location: [facility] OR;  Service: Neurosurgery;;   PERITONEAL CATHETER INSERTION  2017   POLYPECTOMY  10/24/2018   Procedure:  POLYPECTOMY;  Surgeon: Corbin Ade, MD;  [facility];  Service: Endoscopy;;   TOTAL HIP ARTHROPLASTY Left 1997   VENTRICULOPERITONEAL SHUNT N/A 09/08/2021   Procedure: SHUNT INSERTION VENTRICULAR-PERITONEAL;  Surgeon: Bedelia Person, MD;  Location: [facility] OR;  Service: Neurosurgery;  Laterality: N/A;   Patient Active Problem List   Diagnosis Date Noted   Acute non-recurrent maxillary sinusitis 01/11/2023   Allergic sinusitis 11/15/2022   Encounter for general adult medical examination with abnormal findings 11/15/2022   OSA (obstructive sleep apnea)  10/04/2022   Vitreous hemorrhage, right eye (HCC) 10/04/2022   Acute bronchitis 06/02/2022   Primary insomnia 06/02/2022   Wheelchair dependent 05/31/2022   Left hemiplegia (HCC) 01/27/2022   Thrombocytopenia (HCC) 10/04/2021   History of tracheostomy 09/22/2021   Dysphagia 09/22/2021   Intracranial hemorrhage (HCC) 08/23/2021   Proliferative diabetic retinopathy of right eye determined by examination (HCC) 06/29/2021   Stable treated proliferative diabetic retinopathy of left eye determined by examination associated with type 2 diabetes mellitus (HCC) 06/29/2021   Hyponatremia    ESRD (end stage renal disease) on dialysis (HCC)    Essential hypertension 02/14/2019   Obesity, morbid, BMI 50 or higher (HCC) 02/14/2019   Abnormal nuclear stress test 11/02/2018   Rectal bleeding 08/27/2018   Diarrhea 05/22/2018   Anemia 03/25/2018   Chronic cholecystitis with calculus 03/09/2018   Type 2 diabetes mellitus (HCC) 03/09/2018   Pre-transplant evaluation for ESRD (end stage renal disease) 12/19/2017   Dependence on renal dialysis (HCC) 11/06/2017   Diabetic retinopathy (HCC) 01/23/2016   End stage renal disease on dialysis due to type 2 diabetes mellitus (HCC) 01/23/2016   Renal osteodystrophy 01/23/2016   Edema of lower extremity 04/10/2015   Paresthesia of lower extremity 04/10/2015   Recurrent falls 04/10/2015    ONSET DATE: Aug 23, 2021  REFERRING DIAG: G81.94 (ICD-10-CM) - Left hemiplegia (HCC)  THERAPY DIAG:  Weakness of left lower extremity  Difficulty walking  Rationale for Evaluation and Treatment: Rehabilitation  SUBJECTIVE:                                                                                                                                                                                             SUBJECTIVE STATEMENT: Arrives to the clinic with c/o weakness on the L side of the body. Husband states that patient needs some assistance with bed mobility (able  only roll to the L) and is dependent on transfers and he uses an [place] lift to lift the patient. In addition, husband uses a stand lift to sit her to a shower chair. Has a CNA that comes  to the house 4 hours/day, 5 days/week to help. Patient states that she doesn't have any ability to move the L UE/LE. States that the L side feels numb. Patient had CVA on Aug 23, 2021. Patient was at the hospital for about a month and then stayed at the nursing home from July-October 2023. Patient then had PT at home from Nov-Dec 2023 and then in [facility] 2024. Since March, patient never had PT. Denies new onset of stroke. Patient was then referred to outpatient Ptevaluation and management. Pt accompanied by:  spouse  PERTINENT HISTORY: on dialysis, L hip replacement, hx of toe amputation, CHF  PAIN:  Are you having pain? No  PRECAUTIONS: Fall  RED FLAGS: Bowel or bladder incontinence: Yes: uses diapers    WEIGHT BEARING RESTRICTIONS: No  FALLS: Has patient fallen in last 6 months? No  LIVING ENVIRONMENT: Lives with: lives with their spouse Lives in: House/apartment Stairs: No Has following equipment at home: Environmental consultant - 2 wheeled, Wheelchair (manual), shower chair, Tour manager, Ramped entry, and [employer], stand lift, lift pads  PLOF: Independent and Independent with basic ADLs (prior to stroke)  PATIENT GOALS: "to be able to use my limbs like bathing, walking, dressing, and walking"  OBJECTIVE:  Note: Objective measures were completed at Evaluation unless otherwise noted.  DIAGNOSTIC FINDINGS:  03/04/22 MRI HEAD WITHOUT CONTRAST   TECHNIQUE: Multiplanar, multiecho pulse sequences of the brain and surrounding structures were obtained without intravenous contrast.   COMPARISON:  MRI head 08/25/2021, correlation is also made with CT head 03/04/2022   FINDINGS: Evaluation is limited by susceptibility artifact related to the patient's shunt hardware, which obscures the right frontal and  right lateral temporal lobe, particularly affecting the diffusion-weighted and susceptibility weighted sequences.   Brain: No restricted diffusion to suggest acute or subacute infarct, although evaluation of the right frontal and temporal lobes is limited, as described above. Increased signal on diffusion-weighted imaging in the right internal capsule (series 5, image 18) is along the margin the previously noted thalamic hemorrhage and favored to be artifactual, related to susceptibility.   No acute hemorrhage, mass, mass effect, or midline shift. No hydrocephalus or extra-axial collection.   Hemosiderin deposition in the right thalamus, extending into the right cerebral peduncle and midbrain, consistent with sequela of the previously noted hemorrhage. Previously noted associated edema has resolved. Additional scattered foci of hemosiderin deposition appear unchanged from the prior exam.   Right frontal approach ventriculostomy catheter. Grossly unchanged size and configuration of the ventricles compared to recent CTs.   Vascular: Normal arterial flow voids.   Skull and upper cervical spine: Right frontal burr hole. Otherwise grossly normal marrow signal.   Sinuses/Orbits: Mucosal thickening in the sphenoid sinuses. Status post bilateral lens replacements.   Other: The mastoids are well aerated.   IMPRESSION: 1. Evaluation is limited by susceptibility artifact related to the patient's shunt hardware. Within this limitation, no acute intracranial process.   2. Expected evolution of the previously noted right thalamic hemorrhage and encephalomalacia.   3. Right frontal approach ventriculostomy catheter without evidence of hydrocephalus.  COGNITION: Overall cognitive status: Within functional limits for tasks assessed   SENSATION: Light touch: Impaired  on L LE  COORDINATION: Not tested on L LE due to severe weakness   MUSCLE TONE: LLE: Hypotonic on L LE  MUSCLE  LENGTH: Moderate restriction on B hamstrings and gastrocsoleus   POSTURE: rounded shoulders and forward head  LOWER EXTREMITY ROM:     Active  Right Eval Left  Eval  Hip flexion [facility]  Hip extension    Hip abduction    Hip adduction    Hip internal rotation    Hip external rotation    Knee flexion [facility] WFL*  Knee extension [facility] WFL*  Ankle dorsiflexion [facility] WFL*  Ankle plantarflexion [facility] WFL*  Ankle inversion    Ankle eversion     (Blank rows = not tested) *done passively  LOWER EXTREMITY MMT:    MMT Right Eval Left Eval  Hip flexion 3+ 2-  Hip extension    Hip abduction    Hip adduction    Hip internal rotation    Hip external rotation    Knee flexion 4- 2-  Knee extension 3+ 1  Ankle dorsiflexion 3+ 0  Ankle plantarflexion 3+ 0  Ankle inversion    Ankle eversion    (Blank rows = not tested)  BED MOBILITY:  Not tested, patient was initially sitting in a chair with lift pad under  TRANSFERS: Assistive device utilized: None  Sit to stand:  unable to stand despite total assistance from the PT. Patient is only able to do anterior trunk lean  GAIT: Comments: Not assessed. Patient is non-ambulatory at baseline   TODAY'S TREATMENT:                                                                                                                              DATE:  01/19/23 Evaluation and patient education done   PATIENT EDUCATION: Education details: Educated on the pathoanatomy of CVA. Educated on the need to continue rehab at home. Educated on the PT scope of practice. Educated on the importance of compliance to HEP (HEP previously provided by home health PT). Person educated: Patient and Spouse Education method: Explanation Education comprehension: verbalized understanding  ASSESSMENT:  CLINICAL IMPRESSION: Patient is a 57 y.o. female who was seen today for physical therapy evaluation and treatment for L hemiplegia. Patient has L hemiplegia further defined  by difficulty in doing bed mobility and inability to do transfers and ambulation due to weakness and balance deficits. Skilled PT is required to address the impairments and functional limitations listed below. However, skilled PT in the outpatient may not benefit the patient at this time due to the higher physical functioning required to be able to address the functional limitations in this setting. Patient may benefit from home health PT services in order for the patient to learn how to mobilize inside the house in preparation to transition from outpatient care. In addition, patient may benefit from home health OT to educate the patient and the husband on measures to improve ADLs. With this, patient is d/c from this facility. PT messaged the referring provider to refer patient to home health PT/OT.  OBJECTIVE IMPAIRMENTS: decreased balance, decreased mobility, decreased strength, and impaired sensation.   ACTIVITY LIMITATIONS: carrying, lifting, bending, sitting, standing, squatting, stairs, transfers, and bed mobility  PARTICIPATION LIMITATIONS: meal prep, cleaning, laundry, driving, shopping,  community activity, and yard work  PERSONAL FACTORS: Time since onset of injury/illness/exacerbation and 3+ comorbidities: on dialysis, L hip replacement, hx of toe amputation, CHF  are also affecting patient's functional outcome.   CLINICAL DECISION MAKING: Unstable/unpredictable  EVALUATION COMPLEXITY: High  PLAN:  PT FREQUENCY:  0  PT DURATION: other: 0  PLANNED INTERVENTIONS:  Skilled PT in the outpatient not required at this time and patient is D/C, Needs to be referred to home health PT/OT. Referring provider notified.  PHYSICAL THERAPY DISCHARGE SUMMARY  Visits from Start of Care: 1  Current functional level related to goals / functional outcomes: See above   Remaining deficits: See above   Education / Equipment: See above   Patient agrees to discharge. Patient goals were  not set as  patient is not appropriate for the outpatient setting . Patient is being discharged due to  being referred to home health PT/OT.   Tish Frederickson. Edris Schneck, PT, DPT, OCS Board-Certified Clinical Specialist in Orthopedic PT PT Compact Privilege # ([place]): [phone] T 01/19/2023, 9:31 AM

## 2023-01-30 ENCOUNTER — Telehealth: Payer: Self-pay | Admitting: Internal Medicine

## 2023-01-30 NOTE — Telephone Encounter (Signed)
Lamar Laundry called from [facility] [phone] asking will Dr Allena Katz sign off on some orders.

## 2023-01-30 NOTE — Telephone Encounter (Signed)
Spoke to [employer]

## 2023-02-13 ENCOUNTER — Telehealth: Payer: Self-pay

## 2023-02-13 NOTE — Telephone Encounter (Signed)
Copied from CRM [id number]. Topic: Clinical - Home Health Verbal Orders >> Feb 10, 2023  2:18 PM Desma Mcgregor wrote: Caller/Agency: [facility] Callback Number: [phone] Service Requested: Occupational Therapy Frequency: Looking to move the evaluation for the OT effective week of 11/17 Any new concerns about the patient? No

## 2023-02-13 NOTE — Telephone Encounter (Signed)
Unable to reach [facility]

## 2023-02-17 ENCOUNTER — Telehealth: Payer: Self-pay | Admitting: Internal Medicine

## 2023-02-17 NOTE — Telephone Encounter (Signed)
Copied from CRM [id number]. Topic: Clinical - Home Health Verbal Orders >> Feb 17, 2023  2:16 PM Dennison Nancy wrote: Caller/Agency: [facility] Callback Number: : [phone] Service Requested: occupational Therapy  Frequency: occpational therapy week 02/19/23  Any new concerns about the patient? no

## 2023-02-20 NOTE — Telephone Encounter (Signed)
Verbal orders provided. ?

## 2023-02-21 DIAGNOSIS — N3946 Mixed incontinence: Secondary | ICD-10-CM | POA: Insufficient documentation

## 2023-02-21 NOTE — Assessment & Plan Note (Signed)
Due to difficulty with ambulation from left hemiplegia, needs incontinence supplies

## 2023-02-24 ENCOUNTER — Other Ambulatory Visit: Payer: Self-pay | Admitting: Internal Medicine

## 2023-03-07 ENCOUNTER — Other Ambulatory Visit: Payer: Self-pay

## 2023-03-07 ENCOUNTER — Telehealth: Payer: Self-pay

## 2023-03-07 MED ORDER — CALCITRIOL 0.5 MCG PO CAPS: 0.5000 ug | ORAL_CAPSULE | ORAL | 0 refills | Status: DC

## 2023-03-07 NOTE — Telephone Encounter (Signed)
Refills sent to pharmacy, patient advised. 

## 2023-03-07 NOTE — Telephone Encounter (Signed)
Copied from CRM [id number]. Topic: Clinical - Medication Refill >> Mar 07, 2023  3:02 PM Almira Coaster wrote: Most Recent Primary Care Visit:  Provider: Anabel Halon  Department: [facility] CARE  Visit Type: OFFICE VISIT  Date: 01/11/2023  Medication: calcitRIOL (ROCALTROL) 0.5 MCG capsule  Has the patient contacted their pharmacy? Yes, send a request to the office for refill  (Agent: If no, request that the patient contact the pharmacy for the refill. If patient does not wish to contact the pharmacy document the reason why and proceed with request.) (Agent: If yes, when and what did the pharmacy advise?)  Is this the correct pharmacy for this prescription? Yes If no, delete pharmacy and type the correct one.  This is the patient's preferred pharmacy:  [facility] [place] [place] - [address] [place] [address] Phone: [phone] Fax: [phone]    Has the prescription been filled recently? No  Is the patient out of the medication? Yes  Has the patient been seen for an appointment in the last year OR does the patient have an upcoming appointment? Yes  Can we respond through MyChart? No  Agent: Please be advised that Rx refills may take up to 3 business days. We ask that you follow-up with your pharmacy.

## 2023-03-20 ENCOUNTER — Other Ambulatory Visit: Payer: Self-pay | Admitting: Internal Medicine

## 2023-03-20 DIAGNOSIS — I10 Essential (primary) hypertension: Secondary | ICD-10-CM

## 2023-03-23 ENCOUNTER — Other Ambulatory Visit: Payer: Self-pay

## 2023-03-23 MED ORDER — CALCITRIOL 0.5 MCG PO CAPS: 0.5000 ug | ORAL_CAPSULE | ORAL | 0 refills | Status: DC

## 2023-03-28 ENCOUNTER — Encounter: Payer: Self-pay | Admitting: Internal Medicine

## 2023-03-28 ENCOUNTER — Ambulatory Visit (INDEPENDENT_AMBULATORY_CARE_PROVIDER_SITE_OTHER): Payer: 59 | Admitting: Internal Medicine

## 2023-03-28 VITALS — BP 138/78 | HR 70

## 2023-03-28 DIAGNOSIS — Z794 Long term (current) use of insulin: Secondary | ICD-10-CM | POA: Diagnosis not present

## 2023-03-28 DIAGNOSIS — I10 Essential (primary) hypertension: Secondary | ICD-10-CM | POA: Diagnosis not present

## 2023-03-28 DIAGNOSIS — E1169 Type 2 diabetes mellitus with other specified complication: Secondary | ICD-10-CM

## 2023-03-28 DIAGNOSIS — Z993 Dependence on wheelchair: Secondary | ICD-10-CM

## 2023-03-28 DIAGNOSIS — F5101 Primary insomnia: Secondary | ICD-10-CM

## 2023-03-28 DIAGNOSIS — N186 End stage renal disease: Secondary | ICD-10-CM

## 2023-03-28 DIAGNOSIS — Z992 Dependence on renal dialysis: Secondary | ICD-10-CM

## 2023-03-28 DIAGNOSIS — G8194 Hemiplegia, unspecified affecting left nondominant side: Secondary | ICD-10-CM

## 2023-03-28 NOTE — Assessment & Plan Note (Addendum)
 Improved with trazodone as needed Sleep hygiene discussed Recently had sleep study - has OSA, needs CPAP, which has been ordered, needs to check with DME

## 2023-03-28 NOTE — Patient Instructions (Addendum)
 Please avoid morning dose of Carvedilol on dialysis days.  Please continue to take other medications as prescribed.  Please continue to follow low carb diet.

## 2023-03-28 NOTE — Assessment & Plan Note (Signed)
Followed by Nephrology On HD MWF 

## 2023-03-28 NOTE — Assessment & Plan Note (Signed)
Due to right-sided thalamic hemorrhage Currently wheelchair-bound Had referred to home health for PT and home safety evaluation Has hospital bed, needs new Hoyer lift for safe moving from wheelchair to bed

## 2023-03-28 NOTE — Assessment & Plan Note (Signed)
 Lab Results  Component Value Date   HGBA1C 7.6 (H) 01/11/2023   Uncontrolled Has not been on Toujeo  now Started Glipizide  5 mg once daily in 10/24 - now glycemic profile improved Follows up with Nephrologist for ESRD on HD On kidney transplant list at [facility]

## 2023-03-28 NOTE — Progress Notes (Signed)
 Established Patient Office Visit  Subjective:  Patient ID: Janet Mitchell, female    DOB: 1965/04/20  Age: 57 y.o. MRN: [id number]  CC:  Chief Complaint  Patient presents with   Hypertension    Four month follow up    Diabetes    Four month follow up     HPI Janet Mitchell is a 57 y.o. female with past medical history of HTN, DM with ESRD on HD and intracranial hemorrhage who presents for f/u of her chronic medical conditions.  She is currently on HD for ESRD.  Her BP is well controlled today.  She takes amlodipine  10 mg QPM and Coreg  6.25 mg twice daily.  She has had hypotensive spells during HD.  Denies any chest pain, dyspnea or palpitations currently.  Type II DM: Her blood glucose has been well controlled with Glipizide  now. Her last HbA1c was 7.6 in 10/24.  Denies any episode of hypoglycemia recently.  She has mild improvement in sleep with trazodone . She is mostly wheelchair dependent and is likely that she might be taking daytime naps.  She had intracranial hemorrhage in 05/23.  She was admitted at [facility] for right-sided thalamic hemorrhage, for which she had external ventricular shunt placement by neurosurgery.  She had trach and PEG tube placed, and was discharged to SNF on 10/05/21. She has residual left-sided hemiplegia and is wheelchair-bound currently.  She had home PT and her LUE and LLE weakness have improved slightly.  She had vitreous hemorrhage of the right eye and had vitrectomy on 10/06/2022.  Her eye pain has improved now.    Past Medical History:  Diagnosis Date   Abdominal wall hematoma 12/26/2019   Anemia    Arthritis    CHF (congestive heart failure) (HCC)    pt states she has been cleared of heart failure/disease   Chronic cholecystitis with calculus    COVID-19 03/26/2019   COVID-19 virus infection 03/2019   ESRD (end stage renal disease) on dialysis [facility])    Dialysis M-W-F   Headache    a few/wk (03/09/2018) - no longer  having these   History of blood transfusion 10/2017   low blood count (03/09/2018)   Hypertension    OSA (obstructive sleep apnea) 10/04/2022   Respiratory failure requiring intubation (HCC)    Seasonal allergies    Spinal headache    Status post insertion of percutaneous endoscopic gastrostomy (PEG) tube (HCC) 01/26/2022   Type II diabetes mellitus (HCC)     Past Surgical History:  Procedure Laterality Date   AMPUTATION TOE Left 2013   Great toe   AV FISTULA PLACEMENT Left 07/22/2019   Procedure: LEFT ARM BASILIC ARTERIOVENOUS (AV) FISTULA CREATION;  Surgeon: Oris Krystal FALCON, MD;  Location: [facility] OR;  Service: Vascular;  Laterality: Left;   BASCILIC VEIN TRANSPOSITION Left 10/17/2019   Procedure: LEFT SECOND STAGE BASCILIC VEIN TRANSPOSITION;  Surgeon: Oris Krystal FALCON, MD;  Location: [facility] OR;  Service: Vascular;  Laterality: Left;   BIOPSY  10/24/2018   Procedure: BIOPSY;  Surgeon: Shaaron Lamar HERO, MD;  Location: [facility];  Service: Endoscopy;;  right and left colon   CATARACT EXTRACTION W/ INTRAOCULAR LENS IMPLANT Right    CESAREAN SECTION  1994; 1998   CHOLECYSTECTOMY N/A 03/09/2018   Procedure: LAPAROSCOPIC CHOLECYSTECTOMY WITH INTRAOPERATIVE CHOLANGIOGRAM ERAS PATHWAY;  Surgeon: Belinda Cough, MD;  Location: [facility] OR;  Service: General;  Laterality: N/A;   COLONOSCOPY N/A 10/24/2018   Surgeon: Shaaron Lamar HERO, MD;  15 mm adenomatous polyp in the transverse colon removed, segmental biopsies benign.   EYE SURGERY Left 05/15/2018   Removal of blood in the globe (due to DM)   FLEXIBLE SIGMOIDOSCOPY N/A 11/24/2017   Procedure: FLEXIBLE SIGMOIDOSCOPY;  Surgeon: Shaaron Lamar HERO, MD;  Location: [facility];  Service: Endoscopy;  Laterality: N/A;   IR FLUORO GUIDE CV LINE RIGHT  07/17/2019   IR GASTROSTOMY TUBE MOD SED  09/16/2021   IR PARACENTESIS  07/09/2019   IR US  GUIDE VASC ACCESS RIGHT  07/17/2019   LAPAROSCOPIC CHOLECYSTECTOMY  03/09/2018   LAPAROSCOPIC REVISION  VENTRICULAR-PERITONEAL (V-P) SHUNT  09/08/2021   Procedure: LAPAROSCOPIC ASSISTANCE FOR  VENTRICULAR-PERITONEAL (V-P) SHUNT PLACEMENT ;  Surgeon: Debby Dorn MATSU, MD;  Location: [facility] OR;  Service: Neurosurgery;;   PERITONEAL CATHETER INSERTION  2017   POLYPECTOMY  10/24/2018   Procedure: POLYPECTOMY;  Surgeon: Shaaron Lamar HERO, MD;  Location: [facility];  Service: Endoscopy;;   TOTAL HIP ARTHROPLASTY Left 1997   VENTRICULOPERITONEAL SHUNT N/A 09/08/2021   Procedure: SHUNT INSERTION VENTRICULAR-PERITONEAL;  Surgeon: Debby Dorn MATSU, MD;  Location: [facility] OR;  Service: Neurosurgery;  Laterality: N/A;    Family History  Problem Relation Age of Onset   Heart disease Mother    Thrombocytopenia Mother        TTP   Heart failure Father    Kidney disease Paternal Grandfather    Colon cancer Neg Hx     Social History   Socioeconomic History   Marital status: Married    Spouse name: Not on file   Number of children: 2   Years of education: Not on file   Highest education level: Not on file  Occupational History   Not on file  Tobacco Use   Smoking status: Never    Passive exposure: Yes   Smokeless tobacco: Never  Vaping Use   Vaping status: Never Used  Substance and Sexual Activity   Alcohol  use: Never   Drug use: Never   Sexual activity: Yes    Birth control/protection: I.U.D.    Comment: Mirena  IUD  Other Topics Concern   Not on file  Social History Narrative   Not on file   Social Drivers of Health   Financial Resource Strain: Low Risk  (12/20/2022)   Overall Financial Resource Strain (CARDIA)    Difficulty of Paying Living Expenses: Not hard at all  Food Insecurity: No Food Insecurity (12/20/2022)   Hunger Vital Sign    Worried About Running Out of Food in the Last Year: Never true    Ran Out of Food in the Last Year: Never true  Transportation Needs: No Transportation Needs (12/20/2022)   PRAPARE - Administrator, Civil Service (Medical): No    Lack of  Transportation (Non-Medical): No  Physical Activity: Inactive (12/20/2022)   Exercise Vital Sign    Days of Exercise per Week: 0 days    Minutes of Exercise per Session: 0 min  Stress: No Stress Concern Present (12/20/2022)   [employer] of Occupational Health - Occupational Stress Questionnaire    Feeling of Stress : Only a little  Social Connections: Moderately Integrated (12/20/2022)   Social Connection and Isolation Panel [NHANES]    Frequency of Communication with Friends and Family: More than three times a week    Frequency of Social Gatherings with Friends and Family: More than three times a week    Attends Religious Services: More than 4 times per year  Active Member of Clubs or Organizations: No    Attends Banker Meetings: Never    Marital Status: Married  Catering Manager Violence: Not At Risk (12/20/2022)   Humiliation, Afraid, Rape, and Kick questionnaire    Fear of Current or Ex-Partner: No    Emotionally Abused: No    Physically Abused: No    Sexually Abused: No    Outpatient Medications Prior to Visit  Medication Sig Dispense Refill   albuterol  (VENTOLIN  HFA) 108 (90 Base) MCG/ACT inhaler Inhale 2 puffs into the lungs every 6 (six) hours as needed for wheezing or shortness of breath. 8 g 2   amLODipine  (NORVASC ) 10 MG tablet TAKE ONE TABLET BY MOUTH EVERY EVENING 90 tablet 1   azelastine  (ASTELIN ) 0.1 % nasal spray Place 1 spray into both nostrils 2 (two) times daily. Use in each nostril as directed 30 mL 5   B Complex-C-Folic Acid  (RENA-VITE PO) Take 1 tablet by mouth daily.     calcitRIOL  (ROCALTROL ) 0.5 MCG capsule Take 1 capsule (0.5 mcg total) by mouth every Monday, Wednesday, and Friday. 30 capsule 0   carvedilol  (COREG ) 6.25 MG tablet TAKE ONE TABLET BY MOUTH 2 TIMES A DAY WITH A MEAL 60 tablet 2   cetirizine  (ZYRTEC ) 10 MG tablet Take 1 tablet (10 mg total) by mouth daily. 30 tablet 0   glipiZIDE  (GLUCOTROL  XL) 5 MG 24 hr tablet Take 1 tablet  (5 mg total) by mouth daily with breakfast. 30 tablet 3   guaiFENesin -codeine  (ROBITUSSIN AC) 100-10 MG/5ML syrup Take 5 mLs by mouth 3 (three) times daily as needed for cough. 120 mL 0   multivitamin (RENA-VIT) TABS tablet TAKE ONE TABLET BY MOUTH EVERY DAY 30 tablet 0   pantoprazole  (PROTONIX ) 40 MG tablet Take 1 tablet (40 mg total) by mouth daily. 90 tablet 3   sevelamer  carbonate (RENVELA ) 0.8 g PACK packet SMARTSIG:1 Packet(s) Gastro Tube 3 Times Daily     traMADol (ULTRAM) 50 MG tablet Take 50 mg by mouth 2 (two) times daily as needed.     traZODone  (DESYREL ) 50 MG tablet Take 0.5-1 tablets (25-50 mg total) by mouth at bedtime as needed for sleep. 30 tablet 3   No facility-administered medications prior to visit.    No Known Allergies  ROS Review of Systems  Constitutional:  Negative for chills and fever.  HENT:  Negative for congestion, postnasal drip, sinus pressure and sinus pain.   Eyes:  Positive for visual disturbance. Negative for discharge.  Respiratory:  Negative for cough and shortness of breath.   Cardiovascular:  Negative for chest pain and palpitations.  Gastrointestinal:  Negative for diarrhea, nausea and vomiting.  Genitourinary:  Negative for dysuria and hematuria.  Musculoskeletal:  Negative for neck pain and neck stiffness.  Skin:  Negative for rash.  Neurological:  Positive for weakness and numbness.  Psychiatric/Behavioral:  Positive for sleep disturbance. Negative for agitation and behavioral problems. The patient is nervous/anxious.       Objective:    Physical Exam Constitutional:      General: She is not in acute distress.    Appearance: She is obese. She is not diaphoretic.     Comments: In wheelchair  HENT:     Head: Normocephalic.     Nose: No congestion.     Mouth/Throat:     Mouth: Mucous membranes are moist.     Pharynx: No posterior oropharyngeal erythema.  Eyes:     General: No scleral icterus.  Conjunctiva/sclera:     Right eye:  Right conjunctiva is injected.     Pupils: Pupils are equal, round, and reactive to light.  Cardiovascular:     Rate and Rhythm: Normal rate and regular rhythm.     Heart sounds: Normal heart sounds. No murmur heard. Pulmonary:     Breath sounds: Normal breath sounds. No wheezing or rales.  Musculoskeletal:     Cervical back: Neck supple. No tenderness.     Right lower leg: No edema.     Left lower leg: No edema.  Skin:    General: Skin is warm.     Findings: No rash.     Comments: Left UE AV fistula  Neurological:     Mental Status: She is alert and oriented to person, place, and time.     Sensory: Sensory deficit (Left UE and LE) present.     Motor: Weakness (Left UE and LE - 1/5, right UE and LE - 4/5) present.  Psychiatric:        Mood and Affect: Mood normal.        Behavior: Behavior normal.     BP 138/78 (BP Location: Right Arm)   Pulse 70   SpO2 95%  Wt Readings from Last 3 Encounters:  12/20/22 208 lb (94.3 kg)  03/04/22 208 lb 5.4 oz (94.5 kg)  10/27/21 210 lb (95.3 kg)    Lab Results  Component Value Date   TSH 1.265 08/24/2021   Lab Results  Component Value Date   WBC 4.1 03/04/2022   HGB 11.6 (L) 03/04/2022   HCT 34.0 (L) 03/04/2022   MCV 76.2 (L) 03/04/2022   PLT 138 (L) 03/04/2022   Lab Results  Component Value Date   NA 135 03/04/2022   K 6.5 (HH) 03/04/2022   CO2 28 03/04/2022   GLUCOSE 147 (H) 03/04/2022   BUN 30 (H) 03/04/2022   CREATININE 3.60 (H) 03/04/2022   BILITOT 0.8 03/04/2022   ALKPHOS 68 03/04/2022   AST 36 03/04/2022   ALT 16 03/04/2022   PROT 6.7 03/04/2022   ALBUMIN  3.0 (L) 03/04/2022   CALCIUM  8.2 (L) 03/04/2022   ANIONGAP 9 03/04/2022   Lab Results  Component Value Date   CHOL 178 08/23/2021   Lab Results  Component Value Date   HDL 63 08/23/2021   Lab Results  Component Value Date   LDLCALC 38 08/23/2021   Lab Results  Component Value Date   TRIG 174 (H) 08/28/2021   Lab Results  Component Value Date    CHOLHDL 2.8 08/23/2021   Lab Results  Component Value Date   HGBA1C 7.6 (H) 01/11/2023      Assessment & Plan:   Problem List Items Addressed This Visit       Cardiovascular and Mediastinum   Essential hypertension   BP Readings from Last 1 Encounters:  03/28/23 138/78   Well-controlled with amlodipine  10 mg QD and carvedilol  6.25 mg BID Considering hypotensive spells during  HD sessions, advised to avoid carvedilol  morning dose on HD days Counseled for compliance with the medications Advised to follow renal diet        Endocrine   Type 2 diabetes mellitus with other specified complication (HCC) - Primary   Lab Results  Component Value Date   HGBA1C 7.6 (H) 01/11/2023   Uncontrolled Has not been on Toujeo  now Started Glipizide  5 mg once daily in 10/24 - now glycemic profile improved Follows up with Nephrologist for ESRD on HD On  kidney [facility] list [facility]        Nervous and Auditory   Left hemiplegia [facility])   Due to right-sided thalamic hemorrhage Currently wheelchair-bound Had referred to home health for PT and home safety evaluation Has hospital bed, needs new Hoyer lift for safe moving from wheelchair to bed        Genitourinary   ESRD (end stage renal disease) on dialysis [facility])   Followed by Nephrology On HD MWF        Other   Wheelchair dependent   Due to left sided hemiplegia from CVA/intracranial hemorrhage S/p tracheostomy and PEG tube, now removed Dependent for ADLs Has had home PT/OT      Primary insomnia   Improved with trazodone  as needed Sleep hygiene discussed Recently had sleep study - has OSA, needs CPAP, which has been ordered, needs to check with DME       No orders of the defined types were placed in this encounter.   Follow-up: Return in about 3 months (around 06/26/2023) for DM and HTN.    Suzzane MARLA Blanch, MD

## 2023-03-28 NOTE — Assessment & Plan Note (Addendum)
 BP Readings from Last 1 Encounters:  03/28/23 138/78   Well-controlled with amlodipine  10 mg QD and carvedilol  6.25 mg BID Considering hypotensive spells during  HD sessions, advised to avoid carvedilol  morning dose on HD days Counseled for compliance with the medications Advised to follow renal diet

## 2023-03-28 NOTE — Assessment & Plan Note (Addendum)
Due to left sided hemiplegia from CVA/intracranial hemorrhage S/p tracheostomy and PEG tube, now removed Dependent for ADLs Has had home PT/OT

## 2023-04-07 ENCOUNTER — Other Ambulatory Visit: Payer: Self-pay | Admitting: Internal Medicine

## 2023-04-10 ENCOUNTER — Telehealth: Payer: Self-pay | Admitting: Internal Medicine

## 2023-04-10 NOTE — Telephone Encounter (Signed)
Called back to approve orders  

## 2023-04-10 NOTE — Telephone Encounter (Signed)
 Copied from CRM [id number]. Topic: Clinical - Home Health Verbal Orders >> Apr 10, 2023  3:40 PM Jamee ORN wrote: Caller/Agency: [facility] Callback Number: [phone] Service Requested: Occupational Therapy Frequency: Occupational therapy for 1 time a week for 7 weeks Any new concerns about the patient? No

## 2023-04-13 ENCOUNTER — Ambulatory Visit (HOSPITAL_COMMUNITY): Payer: 59

## 2023-04-17 ENCOUNTER — Other Ambulatory Visit: Payer: Self-pay | Admitting: Internal Medicine

## 2023-04-20 ENCOUNTER — Telehealth: Payer: Self-pay | Admitting: Internal Medicine

## 2023-04-20 NOTE — Telephone Encounter (Signed)
Copied from CRM [id number]. Topic: General - Other >> Apr 20, 2023 10:00 AM Antony Haste wrote: Reason for CRM: This patient's physical therapist is requesting an order to be sent in for a hoyer lift.

## 2023-04-20 NOTE — Telephone Encounter (Signed)
Copied from CRM [id number]. Topic: Referral - Question >> Apr 20, 2023 10:59 AM Rona Ravens wrote: Reason for CRM: Patient Husband Janet Mitchell [phone] called in stating that the [facility] Lift has not been received and it has been over 2 months. Can you please reach out to him regarding this. Thank you.

## 2023-04-27 ENCOUNTER — Other Ambulatory Visit: Payer: Self-pay | Admitting: Internal Medicine

## 2023-04-27 DIAGNOSIS — I10 Essential (primary) hypertension: Secondary | ICD-10-CM

## 2023-05-03 ENCOUNTER — Other Ambulatory Visit: Payer: Self-pay | Admitting: Internal Medicine

## 2023-05-03 DIAGNOSIS — E1122 Type 2 diabetes mellitus with diabetic chronic kidney disease: Secondary | ICD-10-CM

## 2023-05-04 ENCOUNTER — Telehealth: Payer: Self-pay

## 2023-05-04 NOTE — Telephone Encounter (Signed)
 Copied from CRM [id number]. Topic: Clinical - Prescription Issue >> May 04, 2023  2:18 PM Delon DASEN wrote: Reason for CRM: Zacheriah with [facility], calling about update on [facility] lift for patient, have not received anything please call [phone]

## 2023-05-05 NOTE — Telephone Encounter (Signed)
 Order was sent to [facility] on 04/21/23.

## 2023-05-14 ENCOUNTER — Other Ambulatory Visit: Payer: Self-pay | Admitting: Internal Medicine

## 2023-05-14 DIAGNOSIS — F5101 Primary insomnia: Secondary | ICD-10-CM

## 2023-05-26 NOTE — Telephone Encounter (Signed)
 Copied from CRM [id number]. Topic: Clinical - Home Health Verbal Orders >> May 26, 2023  8:29 AM Gaetano Hawthorne wrote: Caller/Agency: Iona Coach, Occupational Therapist with [employer] Number: [phone] Service Requested: Occupational Therapy Frequency: Once a week for 8 weeks Any new concerns about the patient? No

## 2023-05-29 ENCOUNTER — Other Ambulatory Visit: Payer: Self-pay | Admitting: Internal Medicine

## 2023-06-01 ENCOUNTER — Telehealth: Payer: Self-pay | Admitting: Internal Medicine

## 2023-06-01 ENCOUNTER — Other Ambulatory Visit: Payer: Self-pay | Admitting: Internal Medicine

## 2023-06-01 NOTE — Telephone Encounter (Signed)
 Level of Care Request Noted Copied Scanned Original in provider box Copy at front desk

## 2023-06-02 NOTE — Telephone Encounter (Signed)
Completed and faxed with confirmation.

## 2023-06-09 ENCOUNTER — Ambulatory Visit: Payer: Self-pay | Admitting: Internal Medicine

## 2023-06-09 NOTE — Telephone Encounter (Signed)
 Chief Complaint: Coughing/Cold Symptoms: Coughing, mild SOB (needed oxygen at dialysis due to breathing) Frequency: A couple of days Pertinent Negatives: Patient denies fever, chest pain Disposition: [] ED /[x] Urgent Care (no appt availability in office) / [] Appointment(In office/virtual)/ []  [place] [facility]/ [] Home Care/ [] Refused Recommended Disposition /[] [place] [facility]/ []  Follow-up with PCP Additional Notes: Patient called with husband, stating patient has been experiencing a cold and a nonproductive cough for a few days. Patient was at dialysis today and placed on oxygen due to the way she was breathing. Patient and husband states there is rattling noise with patient's breathing. Patient states her oxygen levels were normal at dialysis but they placed her on the o2 because of her breathing. Advised patient to go to nearest urgent care now for evaluation. Patient and husband verbalized understanding and stated she will go to [facility] for evaluation.    Copied from CRM [id number]. Topic: Clinical - Red Word Triage >> Jun 09, 2023  3:18 PM Albin Felling L wrote: Red Word that prompted transfer to Nurse Triage: Cough/cold for a couple days. Given oxygen at dialysis Reason for Disposition  [1] MILD difficulty breathing (e.g., minimal/no SOB at rest, SOB with walking, pulse <100) AND [2] still present when not coughing  Answer Assessment - Initial Assessment Questions 1. ONSET: "When did the cough begin?"      Couple days  2. SEVERITY: "How bad is the cough today?"      Coughing spells 3. SPUTUM: "Describe the color of your sputum" (none, dry cough; clear, white, yellow, green)     No sputum 4. HEMOPTYSIS: "Are you coughing up any blood?" If so ask: "How much?" (flecks, streaks, tablespoons, etc.)     No 5. DIFFICULTY BREATHING: "Are you having difficulty breathing?" If Yes, ask: "How bad is it?" (e.g., mild, moderate, severe)    - MILD: No SOB at rest, mild SOB with walking, speaks normally  in sentences, can lie down, no retractions, pulse < 100.    - MODERATE: SOB at rest, SOB with minimal exertion and prefers to sit, cannot lie down flat, speaks in phrases, mild retractions, audible wheezing, pulse 100-120.    - SEVERE: Very SOB at rest, speaks in single words, struggling to breathe, sitting hunched forward, retractions, pulse > 120      Mildly 6. FEVER: "Do you have a fever?" If Yes, ask: "What is your temperature, how was it measured, and when did it start?"     No 7. CARDIAC HISTORY: "Do you have any history of heart disease?" (e.g., heart attack, congestive heart failure)      No 8. LUNG HISTORY: "Do you have any history of lung disease?"  (e.g., pulmonary embolus, asthma, emphysema)     No 9. PE RISK FACTORS: "Do you have a history of blood clots?" (or: recent major surgery, recent prolonged travel, bedridden)     No 10. OTHER SYMPTOMS: "Do you have any other symptoms?" (e.g., runny nose, wheezing, chest pain)       Rattling  Protocols used: Cough - Acute Non-Productive-A-AH

## 2023-06-27 ENCOUNTER — Ambulatory Visit: Payer: 59 | Admitting: Internal Medicine

## 2023-06-29 ENCOUNTER — Other Ambulatory Visit: Payer: Self-pay | Admitting: Internal Medicine

## 2023-07-01 ENCOUNTER — Emergency Department (HOSPITAL_COMMUNITY)

## 2023-07-01 ENCOUNTER — Other Ambulatory Visit: Payer: Self-pay

## 2023-07-01 ENCOUNTER — Emergency Department (HOSPITAL_COMMUNITY)
Admission: EM | Admit: 2023-07-01 | Discharge: 2023-07-01 | Disposition: A | Discharge status: Home / Self Care | Attending: Emergency Medicine | Admitting: Emergency Medicine

## 2023-07-01 DIAGNOSIS — R4781 Slurred speech: Secondary | ICD-10-CM | POA: Diagnosis present

## 2023-07-01 DIAGNOSIS — R531 Weakness: Secondary | ICD-10-CM | POA: Diagnosis not present

## 2023-07-01 DIAGNOSIS — M542 Cervicalgia: Secondary | ICD-10-CM | POA: Diagnosis not present

## 2023-07-01 DIAGNOSIS — R791 Abnormal coagulation profile: Secondary | ICD-10-CM | POA: Insufficient documentation

## 2023-07-01 DIAGNOSIS — Z992 Dependence on renal dialysis: Secondary | ICD-10-CM | POA: Insufficient documentation

## 2023-07-01 LAB — COMPREHENSIVE METABOLIC PANEL WITH GFR
ALT: 18 U/L (ref 0–44)
AST: 30 U/L (ref 15–41)
Albumin: 3.3 g/dL — ABNORMAL LOW (ref 3.5–5.0)
Alkaline Phosphatase: 85 U/L (ref 38–126)
Anion gap: 15 (ref 5–15)
BUN: 19 mg/dL (ref 6–20)
CO2: 28 mmol/L (ref 22–32)
Calcium: 9.6 mg/dL (ref 8.9–10.3)
Chloride: 95 mmol/L — ABNORMAL LOW (ref 98–111)
Creatinine, Ser: 3.49 mg/dL — ABNORMAL HIGH (ref 0.44–1.00)
GFR, Estimated: 15 mL/min — ABNORMAL LOW (ref >60)
Glucose, Bld: 122 mg/dL — ABNORMAL HIGH (ref 70–99)
Potassium: 4.2 mmol/L (ref 3.5–5.1)
Sodium: 138 mmol/L (ref 135–145)
Total Bilirubin: 0.5 mg/dL (ref 0.0–1.2)
Total Protein: 8.3 g/dL — ABNORMAL HIGH (ref 6.5–8.1)

## 2023-07-01 LAB — I-STAT CHEM 8, ED
BUN: 25 mg/dL — ABNORMAL HIGH (ref 6–20)
Calcium, Ion: 1.07 mmol/L — ABNORMAL LOW (ref 1.15–1.40)
Chloride: 96 mmol/L — ABNORMAL LOW (ref 98–111)
Creatinine, Ser: 3.7 mg/dL — ABNORMAL HIGH (ref 0.44–1.00)
Glucose, Bld: 123 mg/dL — ABNORMAL HIGH (ref 70–99)
HCT: 35 % — ABNORMAL LOW (ref 36.0–46.0)
Hemoglobin: 11.9 g/dL — ABNORMAL LOW (ref 12.0–15.0)
Potassium: 4.2 mmol/L (ref 3.5–5.1)
Sodium: 137 mmol/L (ref 135–145)
TCO2: 33 mmol/L — ABNORMAL HIGH (ref 22–32)

## 2023-07-01 LAB — DIFFERENTIAL
Abs Immature Granulocytes: 0.05 10*3/uL (ref 0.00–0.07)
Basophils Absolute: 0 10*3/uL (ref 0.0–0.1)
Basophils Relative: 1 %
Eosinophils Absolute: 0.1 10*3/uL (ref 0.0–0.5)
Eosinophils Relative: 1 %
Immature Granulocytes: 1 %
Lymphocytes Relative: 13 %
Lymphs Abs: 1.1 10*3/uL (ref 0.7–4.0)
Monocytes Absolute: 0.5 10*3/uL (ref 0.1–1.0)
Monocytes Relative: 6 %
Neutro Abs: 6.6 10*3/uL (ref 1.7–7.7)
Neutrophils Relative %: 78 %

## 2023-07-01 LAB — CBC
HCT: 33 % — ABNORMAL LOW (ref 36.0–46.0)
Hemoglobin: 9.9 g/dL — ABNORMAL LOW (ref 12.0–15.0)
MCH: 25.2 pg — ABNORMAL LOW (ref 26.0–34.0)
MCHC: 30 g/dL (ref 30.0–36.0)
MCV: 84 fL (ref 80.0–100.0)
Platelets: 171 10*3/uL (ref 150–400)
RBC: 3.93 MIL/uL (ref 3.87–5.11)
RDW: 16.9 % — ABNORMAL HIGH (ref 11.5–15.5)
WBC: 8.4 10*3/uL (ref 4.0–10.5)
nRBC: 0 % (ref 0.0–0.2)

## 2023-07-01 LAB — PROTIME-INR
INR: 1.1 (ref 0.8–1.2)
Prothrombin Time: 14.3 s (ref 11.4–15.2)

## 2023-07-01 LAB — APTT: aPTT: 34 s (ref 24–36)

## 2023-07-01 LAB — CBG MONITORING, ED: Glucose-Capillary: 133 mg/dL — ABNORMAL HIGH (ref 70–99)

## 2023-07-01 LAB — ETHANOL: Alcohol, Ethyl (B): <10 mg/dL (ref <10)

## 2023-07-01 NOTE — ED Provider Notes (Signed)
 [facility]-EM[facility] DEPT [facility] Emergency Department Provider Note MRN:  [id number]  Arrival date & time: 07/01/23     Chief Complaint   Stroke Symptoms   History of Present Illness   Janet Mitchell is a 58 y.o. year-old female presents to the ED with chief complaint of slurred speech.  Onset after dialysis today at 2pm.  Patient went home and took a nap and woke up still having slurred speech.  EMS was called and patient was brought in for evaluation.  She denies any new weakness or numbness or vision changes.  She states that she has residual LUE weakness from an old stroke.  The slurred speech has resolved.  She states that her speech sounds fine to her and she is a "great conversationalist."  She states that she has some pain in the right neck.  She denies any injury or trauma.  She denies any other associated problems.  History provided by patient.   Review of Systems  Pertinent positive and negative review of systems noted in HPI.    Physical Exam   Vitals:   07/01/23 0007 07/01/23 0300  BP: (!) 106/47 134/74  Pulse: 90 84  Resp: 18 18  Temp: 98.4 F (36.9 C)   SpO2: 99% 100%    CONSTITUTIONAL:  non toxic-appearing, NAD NEURO:  Alert and oriented x 3, CN 3-12 grossly intact, left sided weakness from old stroke EYES:  eyes equal and reactive ENT/NECK:  Supple, no stridor  CARDIO:  normal rate, regular rhythm, appears well-perfused  PULM:  No respiratory distress, CTAB GI/GU:  non-distended,  MSK/SPINE:  No gross deformities, no edema, moves all extremities  SKIN:  no rash, atraumatic   *Additional and/or pertinent findings included in MDM below  Diagnostic and Interventional Summary    EKG Interpretation Date/Time:  Saturday July 01 2023 00:05:38 EDT Ventricular Rate:  91 PR Interval:  179 QRS Duration:  91 QT Interval:  379 QTC Calculation: 467 R Axis:   -49  Text Interpretation: Sinus rhythm Abnormal R-wave progression, early transition LVH with  secondary repolarization abnormality Inferior infarct, old No significant change since last tracing Confirmed by Zadie Rhine ([id number]) on 07/01/2023 12:38:53 AM       Labs Reviewed  CBC - Abnormal; Notable for the following components:      Result Value   Hemoglobin 9.9 (*)    HCT 33.0 (*)    MCH 25.2 (*)    RDW 16.9 (*)    All other components within normal limits  COMPREHENSIVE METABOLIC PANEL WITH GFR - Abnormal; Notable for the following components:   Chloride 95 (*)    Glucose, Bld 122 (*)    Creatinine, Ser 3.49 (*)    Total Protein 8.3 (*)    Albumin 3.3 (*)    GFR, Estimated 15 (*)    All other components within normal limits  I-STAT CHEM 8, ED - Abnormal; Notable for the following components:   Chloride 96 (*)    BUN 25 (*)    Creatinine, Ser 3.70 (*)    Glucose, Bld 123 (*)    Calcium, Ion 1.07 (*)    TCO2 33 (*)    Hemoglobin 11.9 (*)    HCT 35.0 (*)    All other components within normal limits  CBG MONITORING, ED - Abnormal; Notable for the following components:   Glucose-Capillary 133 (*)    All other components within normal limits  ETHANOL  PROTIME-INR  APTT  DIFFERENTIAL  RAPID URINE  DRUG SCREEN, HOSP PERFORMED  URINALYSIS, ROUTINE W REFLEX MICROSCOPIC  HCG, SERUM, QUALITATIVE    CT HEAD WO CONTRAST  Final Result      Medications - No data to display   Procedures  /  Critical Care Procedures  ED Course and Medical Decision Making  I have reviewed the triage vital signs, the nursing notes, and pertinent available records from the EMR.  Social Determinants Affecting Complexity of Care: Patient has no clinically significant social determinants affecting this chief complaint..   ED Course: Clinical Course as of 07/01/23 0344  Sat Jul 01, 2023  0002 I was called to bedside to evaluate patient for code stroke.  EMS reports that patient had slurred speech that began after dialysis today at 2pm.  Patient went home took a nap and woke up still  having slurred speech.  The slurred speech has now completely resolved.  She has old LUE weakness from an old stroke, but no new weakness.  Given that patient is at her baseline and slurred speech has resolved, code stroke not activated. [RB]  0236 I consulted with Dr. Wilford Corner, who tells me that with only transient slurred speech after dialysis, would be ok for discharge as long as symptoms have resolved. [RB]    Clinical Course User Index [RB] Roxy Horseman, PA-C    Medical Decision Making Patient here with episode of slurred speech after dialysis.  This persisted until this evening.  She took a nap after dialysis and still had the slurred speech.  She denies any new numbness or weakness or tingling.  Denies any vision changes.  By the time she arrived to the emergency department, the slurred speech had resolved completely.  She looks well.  Nontoxic.  She denies any complaints now.  She did have an episode of hypoxia while asleep.  She has been assessed for sleep apnea.  I have encouraged to her to follow-up with her doctor for this.  Given reassuring workup and complete resolution of symptoms, feel that she is stable for discharge.    Amount and/or Complexity of Data Reviewed Labs: ordered. Radiology: ordered.         Consultants: I consulted with Dr. Wilford Corner, who recommends no further workup tonight from neuro standpoint.   Treatment and Plan: I considered admission due to patient's initial presentation, but after considering the examination and diagnostic results, patient will not require admission and can be discharged with outpatient follow-up.    Final Clinical Impressions(s) / ED Diagnoses     ICD-10-CM   1. Slurred speech  R47.81       ED Discharge Orders     None         Discharge Instructions Discussed with and Provided to Patient:   Discharge Instructions   None      Roxy Horseman, PA-C 07/01/23 0344    Zadie Rhine, MD 07/01/23 [phone]

## 2023-07-01 NOTE — ED Notes (Signed)
Patient does not make urine.

## 2023-07-01 NOTE — ED Notes (Signed)
With CT 

## 2023-07-01 NOTE — ED Notes (Signed)
 This RN started triage documentation under Lane,NT epic. Information deleted and primary RN made aware.

## 2023-07-01 NOTE — ED Triage Notes (Signed)
 Pt BIB [facility] from home. Per report pt returned from dialysis  at 2pm and had slurred speech. Pt laid down for nap and woke up at 2200; slurred speech still present and AMS.  On arrival to the ED pt was a/ox4.

## 2023-07-06 ENCOUNTER — Other Ambulatory Visit: Payer: Self-pay | Admitting: Internal Medicine

## 2023-07-12 ENCOUNTER — Other Ambulatory Visit: Payer: Self-pay | Admitting: Internal Medicine

## 2023-07-12 DIAGNOSIS — E1122 Type 2 diabetes mellitus with diabetic chronic kidney disease: Secondary | ICD-10-CM

## 2023-07-26 ENCOUNTER — Other Ambulatory Visit: Payer: Self-pay | Admitting: Internal Medicine

## 2023-07-26 DIAGNOSIS — G4733 Obstructive sleep apnea (adult) (pediatric): Secondary | ICD-10-CM

## 2023-07-26 DIAGNOSIS — J209 Acute bronchitis, unspecified: Secondary | ICD-10-CM

## 2023-07-31 ENCOUNTER — Ambulatory Visit (HOSPITAL_COMMUNITY)
Admission: RE | Admit: 2023-07-31 | Discharge: 2023-07-31 | Disposition: A | Discharge status: Home / Self Care | Source: Ambulatory Visit | Attending: Internal Medicine | Admitting: Internal Medicine

## 2023-07-31 DIAGNOSIS — J209 Acute bronchitis, unspecified: Secondary | ICD-10-CM | POA: Diagnosis present

## 2023-08-01 ENCOUNTER — Other Ambulatory Visit: Payer: Self-pay | Admitting: Internal Medicine

## 2023-08-01 DIAGNOSIS — R918 Other nonspecific abnormal finding of lung field: Secondary | ICD-10-CM

## 2023-08-01 DIAGNOSIS — J209 Acute bronchitis, unspecified: Secondary | ICD-10-CM

## 2023-08-03 ENCOUNTER — Encounter (HOSPITAL_COMMUNITY): Payer: Self-pay

## 2023-08-03 ENCOUNTER — Encounter: Payer: Self-pay | Admitting: Gastroenterology

## 2023-08-08 ENCOUNTER — Other Ambulatory Visit: Payer: Self-pay | Admitting: Internal Medicine

## 2023-08-10 ENCOUNTER — Encounter: Payer: Self-pay | Admitting: Internal Medicine

## 2023-08-10 ENCOUNTER — Ambulatory Visit (INDEPENDENT_AMBULATORY_CARE_PROVIDER_SITE_OTHER): Admitting: Internal Medicine

## 2023-08-10 VITALS — BP 133/82 | HR 75 | Ht 68.0 in | Wt 264.4 lb

## 2023-08-10 DIAGNOSIS — E1169 Type 2 diabetes mellitus with other specified complication: Secondary | ICD-10-CM | POA: Diagnosis not present

## 2023-08-10 DIAGNOSIS — R918 Other nonspecific abnormal finding of lung field: Secondary | ICD-10-CM | POA: Insufficient documentation

## 2023-08-10 DIAGNOSIS — Z992 Dependence on renal dialysis: Secondary | ICD-10-CM

## 2023-08-10 DIAGNOSIS — N186 End stage renal disease: Secondary | ICD-10-CM

## 2023-08-10 DIAGNOSIS — I10 Essential (primary) hypertension: Secondary | ICD-10-CM | POA: Diagnosis not present

## 2023-08-10 DIAGNOSIS — J209 Acute bronchitis, unspecified: Secondary | ICD-10-CM | POA: Diagnosis not present

## 2023-08-10 DIAGNOSIS — Z7984 Long term (current) use of oral hypoglycemic drugs: Secondary | ICD-10-CM

## 2023-08-10 MED ORDER — LEVOFLOXACIN 250 MG PO TABS: 250.0000 mg | ORAL_TABLET | ORAL | 0 refills | Status: DC

## 2023-08-10 MED ORDER — PREDNISONE 20 MG PO TABS: ORAL_TABLET | ORAL | 0 refills | Status: AC

## 2023-08-10 NOTE — Progress Notes (Signed)
 Established Patient Office Visit  Subjective:  Patient ID: Janet Mitchell, female    DOB: January 23, 1966  Age: 58 y.o. MRN: [id number]  CC:  Chief Complaint  Patient presents with   Medical Management of Chronic Issues    3 month f/u   Cough    Pt reports congestion and cough ongoing for 2 months.     HPI Janet Mitchell is a 58 y.o. female with past medical history of HTN, DM with ESRD on HD and intracranial hemorrhage who presents for f/u of her chronic medical conditions.  She still complains of cough with clear expectoration and chest congestion for the last 2 months.  She was given azithromycin  in the past for acute bronchitis.  She has tried Robitussin with mild relief.  Her husband does not report any episode of fever.  She has chronic cold sensation.  Her recent x-ray of the chest showed worsening micronodular pattern of lungs.  She is planned to get high-resolution CT of chest and see pulmonology for further evaluation.  She is currently on HD for ESRD.  Her BP is well controlled today.  She takes amlodipine  5 mg QPM and Coreg  6.25 mg twice daily, but avoids morning dose of carvedilol  on HD days.  She denies hypotensive spells during HD now.  Denies any chest pain, dyspnea or palpitations currently.  Type II DM: Her blood glucose has been well controlled with Glipizide  now. Her last HbA1c was 7.6 in 10/24.  Denies any episode of hypoglycemia recently.  She has mild improvement in sleep with trazodone . She is mostly wheelchair dependent and is likely that she might be taking daytime naps.  She had intracranial hemorrhage in 05/23.  She was admitted at [facility] for right-sided thalamic hemorrhage, for which she had external ventricular shunt placement by neurosurgery.  She had trach and PEG tube placed, and was discharged to SNF on 10/05/21. She has residual left-sided hemiplegia and is wheelchair-bound currently.  She had home PT and her LUE and LLE weakness have  improved slightly.  She is mostly bedbound and wheelchair-bound currently.  She had vitreous hemorrhage of the right eye and had vitrectomy on 10/06/2022.  Her eye pain has improved now.    Past Medical History:  Diagnosis Date   Abdominal wall hematoma 12/26/2019   Anemia    Arthritis    CHF (congestive heart failure) (HCC)    pt states she has been cleared of heart failure/disease   Chronic cholecystitis with calculus    COVID-19 03/26/2019   COVID-19 virus infection 03/2019   ESRD (end stage renal disease) on dialysis [facility])    Dialysis M-W-F   Headache    "a few/wk" (03/09/2018) - no longer having these   History of blood transfusion 10/2017   "low blood count" (03/09/2018)   Hypertension    OSA (obstructive sleep apnea) 10/04/2022   Respiratory failure requiring intubation (HCC)    Seasonal allergies    Spinal headache    Status post insertion of percutaneous endoscopic gastrostomy (PEG) tube (HCC) 01/26/2022   Type II diabetes mellitus (HCC)     Past Surgical History:  Procedure Laterality Date   AMPUTATION TOE Left 2013   Great toe   AV FISTULA PLACEMENT Left 07/22/2019   Procedure: LEFT ARM BASILIC ARTERIOVENOUS (AV) FISTULA CREATION;  Surgeon: Mayo Speck, MD;  Location: [facility] OR;  Service: Vascular;  Laterality: Left;   BASCILIC VEIN TRANSPOSITION Left 10/17/2019   Procedure: LEFT SECOND STAGE BASCILIC  VEIN TRANSPOSITION;  Surgeon: Mayo Speck, MD;  Location: [facility] OR;  Service: Vascular;  Laterality: Left;   BIOPSY  10/24/2018   Procedure: BIOPSY;  Surgeon: Suzette Espy, MD;  Location: [facility] ENDO SUITE;  Service: Endoscopy;;  right and left colon   CATARACT EXTRACTION W/ INTRAOCULAR LENS IMPLANT Right    CESAREAN SECTION  1994; 1998   CHOLECYSTECTOMY N/A 03/09/2018   Procedure: LAPAROSCOPIC CHOLECYSTECTOMY WITH INTRAOPERATIVE CHOLANGIOGRAM ERAS PATHWAY;  Surgeon: Dareen Ebbing, MD;  Location: [facility] OR;  Service: General;  Laterality: N/A;   COLONOSCOPY N/A 10/24/2018    Surgeon: Suzette Espy, MD; 15 mm adenomatous polyp in the transverse colon removed, segmental biopsies benign.   EYE SURGERY Left 05/15/2018   Removal of blood in the globe (due to DM)   FLEXIBLE SIGMOIDOSCOPY N/A 11/24/2017   Procedure: FLEXIBLE SIGMOIDOSCOPY;  Surgeon: Suzette Espy, MD;  Location[facility];  Service: Endoscopy;  Laterality: N/A;   IR FLUORO GUIDE CV LINE RIGHT  07/17/2019   IR GASTROSTOMY TUBE MOD SED  09/16/2021   IR PARACENTESIS  07/09/2019   IR US  GUIDE VASC ACCESS RIGHT  07/17/2019   LAPAROSCOPIC CHOLECYSTECTOMY  03/09/2018   LAPAROSCOPIC REVISION VENTRICULAR-PERITONEAL (V-P) SHUNT  09/08/2021   Procedure: LAPAROSCOPIC ASSISTANCE FOR  VENTRICULAR-PERITONEAL (V-P) SHUNT PLACEMENT ;  Surgeon: Van Gelinas, MD;  Location: [facility] OR;  Service: Neurosurgery;;   PERITONEAL CATHETER INSERTION  2017   POLYPECTOMY  10/24/2018   Procedure: POLYPECTOMY;  Surgeon: Suzette Espy, MD;  [facility];  Service: Endoscopy;;   TOTAL HIP ARTHROPLASTY Left 1997   VENTRICULOPERITONEAL SHUNT N/A 09/08/2021   Procedure: SHUNT INSERTION VENTRICULAR-PERITONEAL;  Surgeon: Van Gelinas, MD;  Location: [facility] OR;  Service: Neurosurgery;  Laterality: N/A;    Family History  Problem Relation Age of Onset   Heart disease Mother    Thrombocytopenia Mother        TTP   Heart failure Father    Kidney disease Paternal Grandfather    Colon cancer Neg Hx     Social History   Socioeconomic History   Marital status: Married    Spouse name: Not on file   Number of children: 2   Years of education: Not on file   Highest education level: Not on file  Occupational History   Not on file  Tobacco Use   Smoking status: Never    Passive exposure: Yes   Smokeless tobacco: Never  Vaping Use   Vaping status: Never Used  Substance and Sexual Activity   Alcohol  use: Never   Drug use: Never   Sexual activity: Yes    Birth control/protection: I.U.D.    Comment:  Mirena  IUD  Other Topics Concern   Not on file  Social History Narrative   Not on file   Social Drivers of Health   Financial Resource Strain: Low Risk  (12/20/2022)   Overall Financial Resource Strain (CARDIA)    Difficulty of Paying Living Expenses: Not hard at all  Food Insecurity: No Food Insecurity (12/20/2022)   Hunger Vital Sign    Worried About Running Out of Food in the Last Year: Never true    Ran Out of Food in the Last Year: Never true  Transportation Needs: No Transportation Needs (12/20/2022)   PRAPARE - Administrator, Civil Service (Medical): No    Lack of Transportation (Non-Medical): No  Physical Activity: Inactive (12/20/2022)   Exercise Vital Sign    Days of  Exercise per Week: 0 days    Minutes of Exercise per Session: 0 min  Stress: No Stress Concern Present (12/20/2022)   [employer] of Occupational Health - Occupational Stress Questionnaire    Feeling of Stress : Only a little  Social Connections: Moderately Integrated (12/20/2022)   Social Connection and Isolation Panel [NHANES]    Frequency of Communication with Friends and Family: More than three times a week    Frequency of Social Gatherings with Friends and Family: More than three times a week    Attends Religious Services: More than 4 times per year    Active Member of [employer] or Organizations: No    Attends Banker Meetings: Never    Marital Status: Married  Catering manager Violence: Not At Risk (12/20/2022)   Humiliation, Afraid, Rape, and Kick questionnaire    Fear of Current or Ex-Partner: No    Emotionally Abused: No    Physically Abused: No    Sexually Abused: No    Outpatient Medications Prior to Visit  Medication Sig Dispense Refill   albuterol  (VENTOLIN  HFA) 108 (90 Base) MCG/ACT inhaler Inhale 2 puffs into the lungs every 6 (six) hours as needed for wheezing or shortness of breath. 8 g 2   amLODipine  (NORVASC ) 5 MG tablet Take 5 mg by mouth daily.      azelastine  (ASTELIN ) 0.1 % nasal spray Place 1 spray into both nostrils 2 (two) times daily. Use in each nostril as directed 30 mL 5   calcitRIOL  (ROCALTROL ) 0.5 MCG capsule TAKE ONE CAPSULE BY MOUTH MONDAY, WEDNESDAY, AND FRIDAY 30 capsule 0   carvedilol  (COREG ) 6.25 MG tablet TAKE ONE TABLET BY MOUTH 2 TIMES A DAY WITH A MEAL 60 tablet 2   cetirizine  (ZYRTEC ) 10 MG tablet TAKE ONE TABLET BY MOUTH EVERY DAY 30 tablet 0   glipiZIDE  (GLUCOTROL  XL) 5 MG 24 hr tablet Take 1 tablet (5 mg total) by mouth daily with breakfast. 100 tablet 3   multivitamin (RENA-VIT) TABS tablet TAKE ONE TABLET BY MOUTH EVERY DAY 30 tablet 0   pantoprazole  (PROTONIX ) 40 MG tablet Take 1 tablet (40 mg total) by mouth daily. (Patient taking differently: Take 40 mg by mouth daily as needed (for acid reflux).) 90 tablet 3   sevelamer  carbonate (RENVELA ) 800 MG tablet 1,600 mg 3 (three) times daily with meals.     traMADol (ULTRAM) 50 MG tablet Take 50 mg by mouth 2 (two) times daily as needed.     traZODone  (DESYREL ) 50 MG tablet TAKE 1/2 TO 1 TABLET BY MOUTH AT BEDTIME AS NEEDED FOR SLEEP 30 tablet 3   amLODipine  (NORVASC ) 10 MG tablet TAKE ONE TABLET BY MOUTH EVERY EVENING (Patient taking differently: Take 5 mg by mouth every evening.) 90 tablet 1   guaiFENesin -codeine  (ROBITUSSIN AC) 100-10 MG/5ML syrup Take 5 mLs by mouth 3 (three) times daily as needed for cough. 120 mL 0   No facility-administered medications prior to visit.    No Known Allergies  ROS Review of Systems  Constitutional:  Negative for chills and fever.  HENT:  Negative for congestion, sinus pressure and sinus pain.   Eyes:  Positive for visual disturbance. Negative for discharge.  Respiratory:  Positive for cough and shortness of breath.   Cardiovascular:  Negative for chest pain and palpitations.  Gastrointestinal:  Negative for diarrhea, nausea and vomiting.  Genitourinary:  Negative for dysuria and hematuria.  Musculoskeletal:  Negative for  neck pain and neck stiffness.  Skin:  Negative for rash.  Neurological:  Positive for weakness and numbness.  Psychiatric/Behavioral:  Positive for sleep disturbance. Negative for agitation and behavioral problems. The patient is nervous/anxious.       Objective:    Physical Exam Constitutional:      General: She is not in acute distress.    Appearance: She is obese. She is not diaphoretic.     Comments: In wheelchair  HENT:     Head: Normocephalic.     Nose: No congestion.     Mouth/Throat:     Mouth: Mucous membranes are moist.     Pharynx: No posterior oropharyngeal erythema.  Eyes:     General: No scleral icterus.    Conjunctiva/sclera:     Right eye: Right conjunctiva is injected.     Pupils: Pupils are equal, round, and reactive to light.  Cardiovascular:     Rate and Rhythm: Normal rate and regular rhythm.     Heart sounds: Normal heart sounds. No murmur heard. Pulmonary:     Breath sounds: No wheezing or rales.  Musculoskeletal:     Cervical back: Neck supple. No tenderness.     Right lower leg: No edema.     Left lower leg: No edema.  Skin:    General: Skin is warm.     Findings: No rash.     Comments: Left UE AV fistula  Neurological:     Mental Status: She is alert and oriented to person, place, and time.     Sensory: Sensory deficit (Left UE and LE) present.     Motor: Weakness (Left UE and LE - 1/5, right UE and LE - 4/5) present.  Psychiatric:        Mood and Affect: Mood normal.        Behavior: Behavior normal.     BP 133/82   Pulse 75   Ht 5\' 8"  (1.727 m)   Wt 264 lb 6.4 oz (119.9 kg)   SpO2 94%   BMI 40.20 kg/m  Wt Readings from Last 3 Encounters:  08/10/23 264 lb 6.4 oz (119.9 kg)  07/01/23 209 lb 7 oz (95 kg)  12/20/22 208 lb (94.3 kg)    Lab Results  Component Value Date   TSH 1.265 08/24/2021   Lab Results  Component Value Date   WBC 8.4 07/01/2023   HGB 11.9 (L) 07/01/2023   HCT 35.0 (L) 07/01/2023   MCV 84.0 07/01/2023    PLT 171 07/01/2023   Lab Results  Component Value Date   NA 137 07/01/2023   K 4.2 07/01/2023   CO2 28 07/01/2023   GLUCOSE 123 (H) 07/01/2023   BUN 25 (H) 07/01/2023   CREATININE 3.70 (H) 07/01/2023   BILITOT 0.5 07/01/2023   ALKPHOS 85 07/01/2023   AST 30 07/01/2023   ALT 18 07/01/2023   PROT 8.3 (H) 07/01/2023   ALBUMIN  3.3 (L) 07/01/2023   CALCIUM  9.6 07/01/2023   ANIONGAP 15 07/01/2023   Lab Results  Component Value Date   CHOL 178 08/23/2021   Lab Results  Component Value Date   HDL 63 08/23/2021   Lab Results  Component Value Date   LDLCALC 38 08/23/2021   Lab Results  Component Value Date   TRIG 174 (H) 08/28/2021   Lab Results  Component Value Date   CHOLHDL 2.8 08/23/2021   Lab Results  Component Value Date   HGBA1C 7.6 (H) 01/11/2023      Assessment & Plan:   Problem List  Items Addressed This Visit       Cardiovascular and Mediastinum   Essential hypertension   BP Readings from Last 1 Encounters:  08/10/23 133/82   Well-controlled with amlodipine  5 mg QD and carvedilol  6.25 mg BID Considering hypotensive spells during  HD sessions, advised to avoid carvedilol  morning dose on HD days Counseled for compliance with the medications Advised to follow renal diet      Relevant Medications   amLODipine  (NORVASC ) 5 MG tablet     Respiratory   Acute bronchitis   Has cough and wheezing, likely acute bronchitis Due to high aspiration risk, started empiric levofloxacin 250 mg every other day on HD days after initial 500 mg dose Started oral prednisone taper due to persistent cough Albuterol  inhaler as needed for dyspnea/wheezing Check HRCT chest due to micronodular pattern on chest x-ray      Relevant Medications   predniSONE (DELTASONE) 20 MG tablet   levofloxacin (LEVAQUIN) 250 MG tablet   Multiple pulmonary nodules - Primary   Noted on CXR Has chronic cough and chest congestion Check HRCT chest due to micronodular pattern on chest  x-ray Follow up with pulmonology        Endocrine   Type 2 diabetes mellitus with other specified complication (HCC)   Lab Results  Component Value Date   HGBA1C 7.6 (H) 01/11/2023   Uncontrolled, but improving Has not been on Toujeo  now Started Glipizide  5 mg once daily in 10/24 - now glycemic profile improved Follows up with Nephrologist for ESRD on HD On kidney transplant list [facility]      Relevant Orders   Bayer DCA Hb A1c Waived     Genitourinary   ESRD (end stage renal disease) on dialysis (HCC)   Followed by Nephrology On HD MWF        Other   Dependence on renal dialysis (HCC)   Followed by Nephrology On HD MWF        Meds ordered this encounter  Medications   predniSONE (DELTASONE) 20 MG tablet    Sig: Take 2 tablets (40 mg total) by mouth daily with breakfast for 3 days, THEN 1 tablet (20 mg total) daily with breakfast for 3 days, THEN 0.5 tablets (10 mg total) daily with breakfast for 4 days.    Dispense:  11 tablet    Refill:  0   levofloxacin (LEVAQUIN) 250 MG tablet    Sig: Take 1 tablet (250 mg total) by mouth every other day. After dialysis.    Dispense:  6 tablet    Refill:  0    Follow-up: Return in about 4 months (around 12/11/2023) for DM.    Meldon Sport, MD

## 2023-08-10 NOTE — Assessment & Plan Note (Signed)
Followed by Nephrology On HD MWF 

## 2023-08-10 NOTE — Assessment & Plan Note (Signed)
 Lab Results  Component Value Date   HGBA1C 7.6 (H) 01/11/2023   Uncontrolled, but improving Has not been on Toujeo  now Started Glipizide  5 mg once daily in 10/24 - now glycemic profile improved Follows up with Nephrologist for ESRD on HD On kidney transplant list at [facility]

## 2023-08-10 NOTE — Assessment & Plan Note (Addendum)
 BP Readings from Last 1 Encounters:  08/10/23 133/82   Well-controlled with amlodipine  5 mg QD and carvedilol  6.25 mg BID Considering hypotensive spells during  HD sessions, advised to avoid carvedilol  morning dose on HD days Counseled for compliance with the medications Advised to follow renal diet

## 2023-08-10 NOTE — Patient Instructions (Addendum)
 Please schedule mammogram.  Please start taking Levofloxacin 2 tablets tomorrow and then 1 tablet after each HD session until completed. Please take Prednisone as prescribed.  Please continue taking Robitussin as needed for cough.  Please get CT chest done as scheduled.

## 2023-08-10 NOTE — Assessment & Plan Note (Signed)
 Noted on CXR Has chronic cough and chest congestion Check HRCT chest due to micronodular pattern on chest x-ray Follow up with pulmonology

## 2023-08-10 NOTE — Assessment & Plan Note (Addendum)
 Has cough and wheezing, likely acute bronchitis Due to high aspiration risk, started empiric levofloxacin 250 mg every other day on HD days after initial 500 mg dose Started oral prednisone taper due to persistent cough Albuterol  inhaler as needed for dyspnea/wheezing Check HRCT chest due to micronodular pattern on chest x-ray

## 2023-08-11 ENCOUNTER — Ambulatory Visit: Payer: Self-pay | Admitting: Internal Medicine

## 2023-08-11 LAB — BAYER DCA HB A1C WAIVED: HB A1C (BAYER DCA - WAIVED): 5.8 % — ABNORMAL HIGH (ref 4.8–5.6)

## 2023-08-18 ENCOUNTER — Ambulatory Visit (HOSPITAL_COMMUNITY)

## 2023-08-19 ENCOUNTER — Ambulatory Visit (HOSPITAL_COMMUNITY)
Admission: RE | Admit: 2023-08-19 | Discharge: 2023-08-19 | Disposition: A | Discharge status: Home / Self Care | Source: Ambulatory Visit | Attending: Internal Medicine | Admitting: Internal Medicine

## 2023-08-19 DIAGNOSIS — R918 Other nonspecific abnormal finding of lung field: Secondary | ICD-10-CM | POA: Insufficient documentation

## 2023-08-19 DIAGNOSIS — J209 Acute bronchitis, unspecified: Secondary | ICD-10-CM | POA: Diagnosis present

## 2023-08-27 ENCOUNTER — Other Ambulatory Visit: Payer: Self-pay | Admitting: Internal Medicine

## 2023-08-28 ENCOUNTER — Ambulatory Visit (HOSPITAL_COMMUNITY)
Admission: RE | Admit: 2023-08-28 | Discharge: 2023-08-28 | Disposition: A | Discharge status: Home / Self Care | Source: Ambulatory Visit | Attending: Internal Medicine | Admitting: Internal Medicine

## 2023-08-28 ENCOUNTER — Encounter (HOSPITAL_COMMUNITY): Payer: Self-pay

## 2023-08-28 DIAGNOSIS — Z1231 Encounter for screening mammogram for malignant neoplasm of breast: Secondary | ICD-10-CM | POA: Diagnosis present

## 2023-08-28 DIAGNOSIS — Z0000 Encounter for general adult medical examination without abnormal findings: Secondary | ICD-10-CM

## 2023-08-29 ENCOUNTER — Ambulatory Visit (INDEPENDENT_AMBULATORY_CARE_PROVIDER_SITE_OTHER): Admitting: Internal Medicine

## 2023-08-29 ENCOUNTER — Encounter: Payer: Self-pay | Admitting: Internal Medicine

## 2023-08-29 VITALS — BP 133/86 | HR 75 | Ht 68.0 in

## 2023-08-29 DIAGNOSIS — R918 Other nonspecific abnormal finding of lung field: Secondary | ICD-10-CM | POA: Diagnosis not present

## 2023-08-29 MED ORDER — FAMOTIDINE 20 MG PO TABS: ORAL_TABLET | ORAL | 11 refills | Status: DC

## 2023-08-29 MED ORDER — PREDNISONE 10 MG PO TABS: ORAL_TABLET | ORAL | 0 refills | Status: DC

## 2023-08-29 NOTE — Progress Notes (Signed)
 Janet Mitchell, female    DOB: October 26, 1965    MRN: [id number]   Brief patient profile:  33  yobf  never smoker  referred to pulmonary clinic in [place]  08/29/2023 by Dr Lydia Sams  for abnormal CT       Intracranial hemorrhage with dense left hemiparesis Large right thalamic bleed Obstructive hydrocephalus S/p shunt placement -Appreciate neurosurgery follow-up -On Spa[place] need for LTAC  Hypertensive emergency -Labetalol  as needed -Amlodipine , losartan   End-stage renal disease on hemodialysis -Renal service continues to follow -Avoid nephrotoxic's, renally dose medications -For dialysis 619  Chronic anemia due to end-stage renal disease -On iron minutes -Has remained stable  Hyperglycemia -Continue SSI  Respiratory failure -Off ventilator -Trach was changed 09/10/2021    History of Present Illness  08/29/2023  Pulmonary/ 1st office eval/ Janet Mitchell / [facility] on MWF HD  Chief Complaint  Patient presents with   Shortness of Breath   Establish Care  Dyspnea:  howyer into chair / still able to go out church/ HD / minimal self propelling  Cough: p covid 2021 / worse when head hits pillow > clear mucus  Sleep: bed is electri x 45  pillows under head  SABA use: first things  02: none     No obvious day to day or daytime pattern/variability or assoc excess/ purulent sputum or mucus plugs or hemoptysis or cp or chest tightness, subjective wheeze or overt sinus or hb symptoms.    Also denies any obvious fluctuation of symptoms with weather or environmental changes or other aggravating or alleviating factors except as outlined above   No unusual exposure hx or h/o childhood pna/ asthma or knowledge of premature birth.  Current Allergies, Complete Past Medical History, Past Surgical History, Family History, and Social History were reviewed in [employer] record.  ROS  The following are not active complaints unless bolded Hoarseness, sore throat,  dysphagia, dental problems, itching, sneezing,  nasal congestion or discharge of excess mucus or purulent secretions, ear ache,   fever, chills, sweats, unintended wt loss or wt gain, classically pleuritic or exertional cp,  orthopnea pnd or arm/hand swelling  or leg swelling, presyncope, palpitations, abdominal pain, anorexia, nausea, vomiting, diarrhea  or change in bowel habits or change in bladder habits, change in stools or change in urine, dysuria, hematuria,  rash, arthralgias, visual complaints, headache, numbness, weakness or ataxia or problems with walking or coordination,  change in mood or  memory.            Outpatient Medications Prior to Visit  Medication Sig Dispense Refill   albuterol  (VENTOLIN  HFA) 108 (90 Base) MCG/ACT inhaler Inhale 2 puffs into the lungs every 6 (six) hours as needed for wheezing or shortness of breath. 8 g 2   amLODipine  (NORVASC ) 5 MG tablet Take 5 mg by mouth daily.     azelastine  (ASTELIN ) 0.1 % nasal spray Place 1 spray into both nostrils 2 (two) times daily. Use in each nostril as directed 30 mL 5   calcitRIOL  (ROCALTROL ) 0.5 MCG capsule Take 1 capsule (0.5 mcg total) by mouth every Monday, Wednesday, and Friday. 30 capsule 0   carvedilol  (COREG ) 6.25 MG tablet TAKE ONE TABLET BY MOUTH 2 TIMES A DAY WITH A MEAL 60 tablet 2   cetirizine  (ZYRTEC ) 10 MG tablet TAKE ONE TABLET BY MOUTH EVERY DAY 30 tablet 0   glipiZIDE  (GLUCOTROL  XL) 5 MG 24 hr tablet Take 1 tablet (5 mg total) by mouth daily with breakfast.  100 tablet 3   levofloxacin  (LEVAQUIN ) 250 MG tablet Take 1 tablet (250 mg total) by mouth every other day. After dialysis. 6 tablet 0   multivitamin (RENA-VIT) TABS tablet TAKE ONE TABLET BY MOUTH EVERY DAY 30 tablet 0   pantoprazole  (PROTONIX ) 40 MG tablet Take 1 tablet (40 mg total) by mouth daily. (Patient taking differently: Take 40 mg by mouth daily as needed (for acid reflux).) 90 tablet 3   sevelamer  carbonate (RENVELA ) 800 MG tablet 1,600 mg 3  (three) times daily with meals.     traMADol (ULTRAM) 50 MG tablet Take 50 mg by mouth 2 (two) times daily as needed.     traZODone  (DESYREL ) 50 MG tablet TAKE 1/2 TO 1 TABLET BY MOUTH AT BEDTIME AS NEEDED FOR SLEEP 30 tablet 3   No facility-administered medications prior to visit.    Past Medical History:  Diagnosis Date   Abdominal wall hematoma 12/26/2019   Anemia    Arthritis    CHF (congestive heart failure) (HCC)    pt states she has been cleared of heart failure/disease   Chronic cholecystitis with calculus    COVID-19 03/26/2019   COVID-19 virus infection 03/2019   ESRD (end stage renal disease) on dialysis [facility])    Dialysis M-W-F   Headache    "a few/wk" (03/09/2018) - no longer having these   History of blood transfusion 10/2017   "low blood count" (03/09/2018)   Hypertension    OSA (obstructive sleep apnea) 10/04/2022   Respiratory failure requiring intubation (HCC)    Seasonal allergies    Spinal headache    Status post insertion of percutaneous endoscopic gastrostomy (PEG) tube (HCC) 01/26/2022   Type II diabetes mellitus (HCC)       Objective:     BP 133/86 (BP Location: Right Arm)   Pulse 75   Ht 5\' 8"  (1.727 m)   SpO2 92% Comment: RA  BMI 40.20 kg/m   SpO2: 92 % (RA)  MO (by bmi) w/c bound elderly bf nad    HEENT : Oropharynx  clear      Nasal turbinates nl    NECK :  without  apparent JVD/ palpable Nodes/TM    LUNGS: no acc muscle use,  Nl contour chest which is clear to A and P bilaterally without cough on insp or exp maneuvers   CV:  RRR  no s3 or murmur or increase in P2, and no edema   ABD:  soft and nontender   MS:    ext warm without deformities Or obvious joint restrictions  calf tenderness, cyanosis or clubbing    SKIN: warm and dry without lesions    NEURO:  alert, approp, nl sensorium with  no motor or cerebellar deficits apparent.     I personally reviewed images and agree with radiology impression as follows:   Chest HRCT     08/19/23 1. Image quality is degraded by respiratory motion and expiratory phase imaging. 2. Coarsened peribronchovascular nodularity throughout the lungs may be due to sarcoid. 3. Age advanced three-vessel coronary artery calcification. 4. Air trapping is indicative of small airways disease. 5.  Aortic atherosclerosis (ICD10-I70.0). 6. Enlarged pulmonic trunk, indicative of pulmonary arterial hypertension.           Assessment   No problem-specific Assessment & Plan notes found for this encounter.     Vernestine Gondola, MD 08/29/2023

## 2023-08-29 NOTE — Assessment & Plan Note (Addendum)
 Onset ? 2023  - 08/29/2023 ACE level  - Blasto 08/29/2023  - Cyrto 08/29/2023 - Quant Gold TB 08/29/2023   Dddx includes HSP (though no risk factors) and granulomatous lung dz (especially sarcoid in view of cough)   in absence of any risk for metastatic ca or RA lung dz   Rec Check labs 1st gen H1 blockers per guidelines  to liminate PNDS Max gerd rx >>> also added 6 day taper off  Prednisone  starting at 40 mg per day in case of component of Th-2 driven upper or lower airways inflammation (if cough responds short term only to relapse before return while will on full rx for uacs (as above), then that would point to allergic rhinitis/ asthma or eos bronchitis as alternative dx)    F/u in 3 m sooner prn         Each maintenance medication was reviewed in detail including emphasizing most importantly the difference between maintenance and prns and under what circumstances the prns are to be triggered using an action plan format where appropriate.  Total time for H and P, chart review, counseling, reviewing hfa  device(s) and generating customized AVS unique to this office visit / same day charting =  45 min for multiple  refractory respiratory  symptoms of uncertain etiology

## 2023-08-29 NOTE — Patient Instructions (Addendum)
 Please remember to go to the lab department   for your tests - we will call you with the results when they are available.      Prednisone  10 mg take  4 each am x 2 days,   2 each am x 2 days,  1 each am x 2 days and stop    Change protonix  to 40 mg  before first meal and add pepcid  20 mg an hour before bed.   Please remember to go to the lab department   for your tests - we will call you with the results when they are available.      Please schedule a follow up visit in 3 months but call sooner if needed    If above not successful for the night time cough,   For drainage / throat tickle try take CHLORPHENIRAMINE  4 mg  ("Allergy Relief" 4mg   at [facility] should be easiest to find in the blue box usually on bottom shelf)  take one every 4 hours as needed - extremely effective and inexpensive over the counter- may cause drowsiness so start with just a dose or two an hour before bedtime and see how you tolerate it before trying in daytime.

## 2023-09-02 ENCOUNTER — Ambulatory Visit: Payer: Self-pay | Admitting: Internal Medicine

## 2023-09-02 LAB — CBC WITH DIFFERENTIAL/PLATELET
Basophils Absolute: 0.1 10*3/uL (ref 0.0–0.2)
Basos: 1 %
EOS (ABSOLUTE): 0.3 10*3/uL (ref 0.0–0.4)
Eos: 5 %
Hematocrit: 34.5 % (ref 34.0–46.6)
Hemoglobin: 10.2 g/dL — ABNORMAL LOW (ref 11.1–15.9)
Immature Grans (Abs): 0 10*3/uL (ref 0.0–0.1)
Immature Granulocytes: 0 %
Lymphocytes Absolute: 1.1 10*3/uL (ref 0.7–3.1)
Lymphs: 21 %
MCH: 25.3 pg — ABNORMAL LOW (ref 26.6–33.0)
MCHC: 29.6 g/dL — ABNORMAL LOW (ref 31.5–35.7)
MCV: 86 fL (ref 79–97)
Monocytes Absolute: 0.4 10*3/uL (ref 0.1–0.9)
Monocytes: 8 %
Neutrophils Absolute: 3.6 10*3/uL (ref 1.4–7.0)
Neutrophils: 65 %
Platelets: 149 10*3/uL — ABNORMAL LOW (ref 150–450)
RBC: 4.03 x10E6/uL (ref 3.77–5.28)
RDW: 17 % — ABNORMAL HIGH (ref 11.7–15.4)
WBC: 5.5 10*3/uL (ref 3.4–10.8)

## 2023-09-02 LAB — IGE: IgE (Immunoglobulin E), Serum: 82 [IU]/mL (ref 6–495)

## 2023-09-02 LAB — QUANTIFERON-TB GOLD PLUS
QuantiFERON Mitogen Value: 5.27 [IU]/mL
QuantiFERON Nil Value: 3.21 [IU]/mL
QuantiFERON TB1 Ag Value: 0.03 [IU]/mL
QuantiFERON TB2 Ag Value: 0.03 [IU]/mL
QuantiFERON-TB Gold Plus: NEGATIVE

## 2023-09-02 LAB — ANGIOTENSIN CONVERTING ENZYME: Angio Convert Enzyme: 79 U/L (ref 14–82)

## 2023-09-02 LAB — CRYPTOCOCCAL ANTIGEN: Cryptococcus Antigen, Serum: NEGATIVE

## 2023-09-02 LAB — SEDIMENTATION RATE: Sed Rate: 49 mm/h — ABNORMAL HIGH (ref 0–40)

## 2023-09-04 ENCOUNTER — Other Ambulatory Visit: Payer: Self-pay | Admitting: Internal Medicine

## 2023-09-04 DIAGNOSIS — I10 Essential (primary) hypertension: Secondary | ICD-10-CM

## 2023-09-04 NOTE — Progress Notes (Signed)
Patient has reviewed results and recommendations via My Chart.

## 2023-09-18 ENCOUNTER — Other Ambulatory Visit: Payer: Self-pay | Admitting: Internal Medicine

## 2023-09-21 ENCOUNTER — Ambulatory Visit: Admitting: Internal Medicine

## 2023-09-26 ENCOUNTER — Ambulatory Visit (HOSPITAL_COMMUNITY)
Admission: RE | Admit: 2023-09-26 | Discharge: 2023-09-26 | Disposition: A | Discharge status: Home / Self Care | Attending: Vascular Surgery | Admitting: Vascular Surgery

## 2023-09-26 ENCOUNTER — Encounter (HOSPITAL_COMMUNITY)
Admission: RE | Disposition: A | Discharge status: Home / Self Care | Payer: Self-pay | Source: Home / Self Care | Attending: Vascular Surgery

## 2023-09-26 ENCOUNTER — Other Ambulatory Visit: Payer: Self-pay

## 2023-09-26 DIAGNOSIS — Y832 Surgical operation with anastomosis, bypass or graft as the cause of abnormal reaction of the patient, or of later complication, without mention of misadventure at the time of the procedure: Secondary | ICD-10-CM | POA: Insufficient documentation

## 2023-09-26 DIAGNOSIS — Z992 Dependence on renal dialysis: Secondary | ICD-10-CM | POA: Diagnosis not present

## 2023-09-26 DIAGNOSIS — T82858A Stenosis of vascular prosthetic devices, implants and grafts, initial encounter: Secondary | ICD-10-CM | POA: Diagnosis not present

## 2023-09-26 DIAGNOSIS — Z79899 Other long term (current) drug therapy: Secondary | ICD-10-CM | POA: Diagnosis not present

## 2023-09-26 DIAGNOSIS — I120 Hypertensive chronic kidney disease with stage 5 chronic kidney disease or end stage renal disease: Secondary | ICD-10-CM | POA: Insufficient documentation

## 2023-09-26 DIAGNOSIS — Z7984 Long term (current) use of oral hypoglycemic drugs: Secondary | ICD-10-CM | POA: Insufficient documentation

## 2023-09-26 DIAGNOSIS — N186 End stage renal disease: Secondary | ICD-10-CM

## 2023-09-26 DIAGNOSIS — E1122 Type 2 diabetes mellitus with diabetic chronic kidney disease: Secondary | ICD-10-CM | POA: Insufficient documentation

## 2023-09-26 HISTORY — PX: VENOUS ANGIOPLASTY: CATH118376

## 2023-09-26 HISTORY — PX: A/V SHUNT INTERVENTION: CATH118220

## 2023-09-26 LAB — GLUCOSE, CAPILLARY: Glucose-Capillary: 134 mg/dL — ABNORMAL HIGH (ref 70–99)

## 2023-09-26 SURGERY — A/V SHUNT INTERVENTION
Anesthesia: LOCAL | Site: Arm Upper | Laterality: Right

## 2023-09-26 MED ORDER — LIDOCAINE HCL (PF) 1 % IJ SOLN
INTRAMUSCULAR | Status: AC
Start: 2023-09-26 — End: 2023-09-26
  Filled 2023-09-26: qty 30

## 2023-09-26 MED ORDER — HEPARIN (PORCINE) IN NACL 1000-0.9 UT/500ML-% IV SOLN
INTRAVENOUS | Status: DC | PRN
  Administered 2023-09-26: 500 mL

## 2023-09-26 MED ORDER — LIDOCAINE HCL (PF) 1 % IJ SOLN
INTRAMUSCULAR | Status: DC | PRN
  Administered 2023-09-26: 5 mL via INTRADERMAL

## 2023-09-26 MED ORDER — IODIXANOL 320 MG/ML IV SOLN
INTRAVENOUS | Status: DC | PRN
  Administered 2023-09-26: 45 mL

## 2023-09-26 MED ORDER — HEPARIN SODIUM (PORCINE) 1000 UNIT/ML IJ SOLN
INTRAMUSCULAR | Status: DC | PRN
  Administered 2023-09-26: 2000 [IU] via INTRAVENOUS

## 2023-09-26 MED ORDER — HEPARIN SODIUM (PORCINE) 1000 UNIT/ML IJ SOLN
INTRAMUSCULAR | Status: AC
Start: 2023-09-26 — End: 2023-09-26
  Filled 2023-09-26: qty 10

## 2023-09-26 SURGICAL SUPPLY — 11 items
BALLOON MUSTANG 8.0X40 75 (BALLOONS) IMPLANT
GLIDEWIRE ADV .035X180CM (WIRE) IMPLANT
KIT ENCORE 26 ADVANTAGE (KITS) IMPLANT
KIT MICROPUNCTURE NIT STIFF (SHEATH) IMPLANT
KIT PV (KITS) ×3 IMPLANT
MAT PREVALON FULL STRYKER (MISCELLANEOUS) IMPLANT
SHEATH PINNACLE R/O II 6F 4CM (SHEATH) IMPLANT
SHEATH PROBE COVER 6X72 (BAG) IMPLANT
TRAY PV CATH (CUSTOM PROCEDURE TRAY) ×3 IMPLANT
TUBING CIL FLEX 10 FLL-RA (TUBING) IMPLANT
WIRE BENTSON .035X145CM (WIRE) IMPLANT

## 2023-09-26 NOTE — H&P (Signed)
 Hospital Consult    Reason for Consult: Upper extremity fistula malfunction Requesting Physician: Nephrology MRN #:  [id number]  History of Present Illness: This is a 58 y.o. female with history of left arm brachiobasilic fistula transposition on 10/17/2019.  Her access has been working nicely until recently when dialysis appreciated low flows.  They also appreciated a small mount of clotting.  She presents today for fistulogram.  On exam, she was doing well, accompanied by her husband.  Denied signs and symptoms of steal syndrome.    Past Medical History:  Diagnosis Date   Abdominal wall hematoma 12/26/2019   Anemia    Arthritis    CHF (congestive heart failure) (HCC)    pt states she has been cleared of heart failure/disease   Chronic cholecystitis with calculus    COVID-19 03/26/2019   COVID-19 virus infection 03/2019   ESRD (end stage renal disease) on dialysis [facility])    Dialysis M-W-F   Headache    a few/wk (03/09/2018) - no longer having these   History of blood transfusion 10/2017   low blood count (03/09/2018)   Hypertension    OSA (obstructive sleep apnea) 10/04/2022   Respiratory failure requiring intubation (HCC)    Seasonal allergies    Spinal headache    Status post insertion of percutaneous endoscopic gastrostomy (PEG) tube (HCC) 01/26/2022   Type II diabetes mellitus (HCC)     Past Surgical History:  Procedure Laterality Date   AMPUTATION TOE Left 2013   Great toe   AV FISTULA PLACEMENT Left 07/22/2019   Procedure: LEFT ARM BASILIC ARTERIOVENOUS (AV) FISTULA CREATION;  Surgeon: Oris Krystal FALCON, MD;  Location: [facility] OR;  Service: Vascular;  Laterality: Left;   BASCILIC VEIN TRANSPOSITION Left 10/17/2019   Procedure: LEFT SECOND STAGE BASCILIC VEIN TRANSPOSITION;  Surgeon: Oris Krystal FALCON, MD;  Location: [facility] OR;  Service: Vascular;  Laterality: Left;   BIOPSY  10/24/2018   Procedure: BIOPSY;  Surgeon: Shaaron Lamar HERO, MD;  Location: [facility] SUITE;  Service:  Endoscopy;;  right and left colon   CATARACT EXTRACTION W/ INTRAOCULAR LENS IMPLANT Right    CESAREAN SECTION  1994; 1998   CHOLECYSTECTOMY N/A 03/09/2018   Procedure: LAPAROSCOPIC CHOLECYSTECTOMY WITH INTRAOPERATIVE CHOLANGIOGRAM ERAS PATHWAY;  Surgeon: Belinda Cough, MD;  Location: [facility] OR;  Service: General;  Laterality: N/A;   COLONOSCOPY N/A 10/24/2018   Surgeon: Shaaron Lamar HERO, MD; 15 mm adenomatous polyp in the transverse colon removed, segmental biopsies benign.   EYE SURGERY Left 05/15/2018   Removal of blood in the globe (due to DM)   FLEXIBLE SIGMOIDOSCOPY N/A 11/24/2017   Procedure: FLEXIBLE SIGMOIDOSCOPY;  Surgeon: Shaaron Lamar HERO, MD;  Location: [facility];  Service: Endoscopy;  Laterality: N/A;   IR FLUORO GUIDE CV LINE RIGHT  07/17/2019   IR GASTROSTOMY TUBE MOD SED  09/16/2021   IR PARACENTESIS  07/09/2019   IR US  GUIDE VASC ACCESS RIGHT  07/17/2019   LAPAROSCOPIC CHOLECYSTECTOMY  03/09/2018   LAPAROSCOPIC REVISION VENTRICULAR-PERITONEAL (V-P) SHUNT  09/08/2021   Procedure: LAPAROSCOPIC ASSISTANCE FOR  VENTRICULAR-PERITONEAL (V-P) SHUNT PLACEMENT ;  Surgeon: Debby Dorn MATSU, MD;  Location: [facility] OR;  Service: Neurosurgery;;   PERITONEAL CATHETER INSERTION  2017   POLYPECTOMY  10/24/2018   Procedure: POLYPECTOMY;  Surgeon: Shaaron Lamar HERO, MD;  Location: [facility];  Service: Endoscopy;;   TOTAL HIP ARTHROPLASTY Left 1997   VENTRICULOPERITONEAL SHUNT N/A 09/08/2021   Procedure: SHUNT INSERTION VENTRICULAR-PERITONEAL;  Surgeon: Debby Dorn MATSU, MD;  Location: [facility] OR;  Service: Neurosurgery;  Laterality: N/A;    No Known Allergies  Prior to Admission medications   Medication Sig Start Date End Date Taking? Authorizing Provider  albuterol  (VENTOLIN  HFA) 108 (90 Base) MCG/ACT inhaler Inhale 2 puffs into the lungs every 6 (six) hours as needed for wheezing or shortness of breath. 05/31/22   Tobie Suzzane POUR, MD  amLODipine  (NORVASC ) 5 MG tablet Take 5 mg by mouth daily.     [provider]  azelastine  (ASTELIN ) 0.1 % nasal spray Place 1 spray into both nostrils 2 (two) times daily. Use in each nostril as directed 11/15/22   Tobie Suzzane POUR, MD  calcitRIOL  (ROCALTROL ) 0.5 MCG capsule Take 1 capsule (0.5 mcg total) by mouth every Monday, Wednesday, and Friday. 08/28/23   Tobie Suzzane POUR, MD  carvedilol  (COREG ) 6.25 MG tablet TAKE ONE TABLET BY MOUTH 2 TIMES A DAY WITH A MEAL 09/04/23   Tobie Suzzane POUR, MD  cetirizine  (ZYRTEC ) 10 MG tablet TAKE ONE TABLET BY MOUTH EVERY DAY 08/28/23   Tobie Suzzane POUR, MD  famotidine  (PEPCID ) 20 MG tablet One after supper 08/29/23   Darlean Ozell NOVAK, MD  glipiZIDE  (GLUCOTROL  XL) 5 MG 24 hr tablet Take 1 tablet (5 mg total) by mouth daily with breakfast. 07/13/23   Tobie Suzzane POUR, MD  levofloxacin  (LEVAQUIN ) 250 MG tablet Take 1 tablet (250 mg total) by mouth every other day. After dialysis. 08/10/23   Patel, Rutwik K, MD  multivitamin (RENA-VIT) TABS tablet TAKE ONE TABLET BY MOUTH EVERY DAY 09/18/23   Tobie Suzzane POUR, MD  pantoprazole  (PROTONIX ) 40 MG tablet Take 1 tablet (40 mg total) by mouth daily. Patient taking differently: Take 40 mg by mouth daily as needed (for acid reflux). 03/02/22   Cindie Carlin POUR, DO  predniSONE  (DELTASONE ) 10 MG tablet Take  4 each am x 2 days,   2 each am x 2 days,  1 each am x 2 days and stop 08/29/23   Wert, Michael B, MD  sevelamer  carbonate (RENVELA ) 800 MG tablet 1,600 mg 3 (three) times daily with meals. 04/15/22   [provider]  traMADol (ULTRAM) 50 MG tablet Take 50 mg by mouth 2 (two) times daily as needed. 11/12/21   [provider]  traZODone  (DESYREL ) 50 MG tablet TAKE 1/2 TO 1 TABLET BY MOUTH AT BEDTIME AS NEEDED FOR SLEEP 05/15/23   Tobie Suzzane POUR, MD    Social History   Socioeconomic History   Marital status: Married    Spouse name: Not on file   Number of children: 2   Years of education: Not on file   Highest education level: Not on file  Occupational History   Not  on file  Tobacco Use   Smoking status: Never    Passive exposure: Yes   Smokeless tobacco: Never  Vaping Use   Vaping status: Never Used  Substance and Sexual Activity   Alcohol  use: Never   Drug use: Never   Sexual activity: Yes    Birth control/protection: I.U.D.    Comment: Mirena  IUD  Other Topics Concern   Not on file  Social History Narrative   Not on file   Social Drivers of Health   Financial Resource Strain: Low Risk  (12/20/2022)   Overall Financial Resource Strain (CARDIA)    Difficulty of Paying Living Expenses: Not hard at all  Food Insecurity: No Food Insecurity (12/20/2022)   Hunger Vital Sign    Worried About Running  Out of Food in the Last Year: Never true    Ran Out of Food in the Last Year: Never true  Transportation Needs: No Transportation Needs (12/20/2022)   PRAPARE - Administrator, Civil Service (Medical): No    Lack of Transportation (Non-Medical): No  Physical Activity: Inactive (12/20/2022)   Exercise Vital Sign    Days of Exercise per Week: 0 days    Minutes of Exercise per Session: 0 min  Stress: No Stress Concern Present (12/20/2022)   [employer] of Occupational Health - Occupational Stress Questionnaire    Feeling of Stress : Only a little  Social Connections: Moderately Integrated (12/20/2022)   Social Connection and Isolation Panel    Frequency of Communication with Friends and Family: More than three times a week    Frequency of Social Gatherings with Friends and Family: More than three times a week    Attends Religious Services: More than 4 times per year    Active Member of [employer] or Organizations: No    Attends Banker Meetings: Never    Marital Status: Married  Catering manager Violence: Not At Risk (12/20/2022)   Humiliation, Afraid, Rape, and Kick questionnaire    Fear of Current or Ex-Partner: No    Emotionally Abused: No    Physically Abused: No    Sexually Abused: No   Family History  Problem  Relation Age of Onset   Heart disease Mother    Thrombocytopenia Mother        TTP   Heart failure Father    Kidney disease Paternal Grandfather    Colon cancer Neg Hx     ROS: Otherwise negative unless mentioned in HPI  Physical Examination  Vitals:   09/26/23 0932 09/26/23 0940  BP: (!) 146/72 (!) 146/72  Pulse: 70 70  Resp: 14 14  Temp:    SpO2: 91% 97%   There is no height or weight on file to calculate BMI.  General:  WDWN in NAD Gait: Not observed HENT: WNL, normocephalic Pulmonary: normal non-labored breathing, without Rales, rhonchi,  wheezing Cardiac: regular Abdomen: soft, NT/ND, no masses Skin: without rashes Vascular Exam/Pulses: Soft thrill in the left brachiobasilic fistula Extremities: without ischemic changes, without Gangrene , without cellulitis; without open wounds;  Musculoskeletal: no muscle wasting or atrophy  Neurologic: A&O X 3;  No focal weakness or paresthesias are detected; speech is fluent/normal Psychiatric:  The pt has Normal affect. Lymph:  Unremarkable  CBC    Component Value Date/Time   WBC 5.5 08/29/2023 1500   WBC 8.4 07/01/2023 0003   RBC 4.03 08/29/2023 1500   RBC 3.93 07/01/2023 0003   HGB 10.2 (L) 08/29/2023 1500   HCT 34.5 08/29/2023 1500   PLT 149 (L) 08/29/2023 1500   MCV 86 08/29/2023 1500   MCH 25.3 (L) 08/29/2023 1500   MCH 25.2 (L) 07/01/2023 0003   MCHC 29.6 (L) 08/29/2023 1500   MCHC 30.0 07/01/2023 0003   RDW 17.0 (H) 08/29/2023 1500   LYMPHSABS 1.1 08/29/2023 1500   MONOABS 0.5 07/01/2023 0003   EOSABS 0.3 08/29/2023 1500   BASOSABS 0.1 08/29/2023 1500    BMET    Component Value Date/Time   NA 137 07/01/2023 0033   K 4.2 07/01/2023 0033   CL 96 (L) 07/01/2023 0033   CO2 28 07/01/2023 0003   GLUCOSE 123 (H) 07/01/2023 0033   BUN 25 (H) 07/01/2023 0033   CREATININE 3.70 (H) 07/01/2023 0033   CREATININE 5.02 (  H) 08/29/2019 1506   CALCIUM  9.6 07/01/2023 0003   GFRNONAA 15 (L) 07/01/2023 0003    GFRNONAA 9 (L) 08/29/2019 1506   GFRAA 11 (L) 08/29/2019 1506    COAGS: Lab Results  Component Value Date   INR 1.1 07/01/2023   INR 1.2 03/04/2022   INR 1.1 09/16/2021     ASSESSMENT/PLAN: This is a 58 y.o. female 58 year old female currently undergoing dialysis using left arm brachiobasilic fistula.  She has had a few instances of low flow.  After discussing the risks and benefits of left arm fistulogram in effort to define and improve flow through the fistula for continued dialysis access, the patient elected to proceed.    Fonda FORBES Rim MD MS [facility] [phone] 09/26/2023  10:04 AM

## 2023-09-26 NOTE — Op Note (Signed)
    Patient name: Janet Mitchell MRN: [id number] DOB: December 28, 1965 Sex: female  09/26/2023 Pre-operative Diagnosis: Fistula malfunction Post-operative diagnosis:  Same Surgeon:  Fonda FORBES Rim, MD Procedure Performed: 1.  Left arm fistulogram 2.  8 x 40 mm balloon venoplasty left brachiobasilic fistula 3.  Contrast volume 45 mL    Indications: Patient is a 58 year old female with end-stage renal disease currently using left arm brachiobasilic fistula for dialysis.  She was sent to our office due to low flow.  After discussing risk and benefits of left arm fistulogram in effort to define, and possibly improve flow state she can continue using the left arm for dialysis, the patient elected to proceed  Findings:  Widely patent arterial anastomosis Focal, greater than 70% stenosis of the brachiobasilic fistula roughly 3 cm from the anastomosis Otherwise, widely patent left brachiobasilic fistula No central stenosis   Procedure:  The patient was identified in the holding area and taken to room 8.  The patient was then placed supine on the table and prepped and draped in the usual sterile fashion.  A time out was called.   The patient was accessed in retrograde fashion using a micropuncture needle.  A micropuncture sheath followed.  Next, I performed a left upper extremity fistulogram.  See results above.  I elected to intervene on the greater than 70% stenotic lesion 3 cm from the anastomosis.  A 6 French sheath was placed, followed by 2000 units of heparin . Next, an 8 x 40 mm balloon was brought onto the field and expanded across the lesion for 2 minutes.  Follow-up venogram demonstrated resolution of flow-limiting stenosis.  No residual stenosis identified.  Monocryl suture was used for access management.  The fistula can continue to be used for access.   Fonda FORBES Rim MD [facility] [place] Office: [phone]

## 2023-09-27 ENCOUNTER — Encounter (HOSPITAL_COMMUNITY): Payer: Self-pay | Admitting: Vascular Surgery

## 2023-10-02 ENCOUNTER — Other Ambulatory Visit: Payer: Self-pay | Admitting: Internal Medicine

## 2023-10-03 ENCOUNTER — Other Ambulatory Visit (HOSPITAL_COMMUNITY)

## 2023-10-03 ENCOUNTER — Inpatient Hospital Stay (HOSPITAL_COMMUNITY)
Admission: EM | Admit: 2023-10-03 | Discharge: 2023-10-05 | DRG: 070 | Disposition: A | Discharge status: Home Health | Attending: Internal Medicine | Admitting: Internal Medicine

## 2023-10-03 ENCOUNTER — Encounter (HOSPITAL_COMMUNITY): Payer: Self-pay

## 2023-10-03 ENCOUNTER — Emergency Department (HOSPITAL_COMMUNITY)

## 2023-10-03 ENCOUNTER — Other Ambulatory Visit: Payer: Self-pay

## 2023-10-03 DIAGNOSIS — G4733 Obstructive sleep apnea (adult) (pediatric): Secondary | ICD-10-CM | POA: Diagnosis present

## 2023-10-03 DIAGNOSIS — R569 Unspecified convulsions: Secondary | ICD-10-CM | POA: Diagnosis not present

## 2023-10-03 DIAGNOSIS — I132 Hypertensive heart and chronic kidney disease with heart failure and with stage 5 chronic kidney disease, or end stage renal disease: Secondary | ICD-10-CM | POA: Diagnosis present

## 2023-10-03 DIAGNOSIS — Z91199 Patient's noncompliance with other medical treatment and regimen due to unspecified reason: Secondary | ICD-10-CM

## 2023-10-03 DIAGNOSIS — Z982 Presence of cerebrospinal fluid drainage device: Secondary | ICD-10-CM

## 2023-10-03 DIAGNOSIS — Z992 Dependence on renal dialysis: Secondary | ICD-10-CM

## 2023-10-03 DIAGNOSIS — G9341 Metabolic encephalopathy: Secondary | ICD-10-CM | POA: Diagnosis not present

## 2023-10-03 DIAGNOSIS — I69354 Hemiplegia and hemiparesis following cerebral infarction affecting left non-dominant side: Secondary | ICD-10-CM

## 2023-10-03 DIAGNOSIS — Z96642 Presence of left artificial hip joint: Secondary | ICD-10-CM | POA: Diagnosis present

## 2023-10-03 DIAGNOSIS — E1122 Type 2 diabetes mellitus with diabetic chronic kidney disease: Secondary | ICD-10-CM | POA: Diagnosis present

## 2023-10-03 DIAGNOSIS — E66813 Obesity, class 3: Secondary | ICD-10-CM | POA: Diagnosis present

## 2023-10-03 DIAGNOSIS — E872 Acidosis, unspecified: Secondary | ICD-10-CM | POA: Diagnosis present

## 2023-10-03 DIAGNOSIS — M1990 Unspecified osteoarthritis, unspecified site: Secondary | ICD-10-CM | POA: Diagnosis present

## 2023-10-03 DIAGNOSIS — E875 Hyperkalemia: Secondary | ICD-10-CM | POA: Diagnosis present

## 2023-10-03 DIAGNOSIS — R414 Neurologic neglect syndrome: Secondary | ICD-10-CM | POA: Diagnosis present

## 2023-10-03 DIAGNOSIS — Z8249 Family history of ischemic heart disease and other diseases of the circulatory system: Secondary | ICD-10-CM

## 2023-10-03 DIAGNOSIS — Z832 Family history of diseases of the blood and blood-forming organs and certain disorders involving the immune mechanism: Secondary | ICD-10-CM

## 2023-10-03 DIAGNOSIS — Z6841 Body Mass Index (BMI) 40.0 and over, adult: Secondary | ICD-10-CM

## 2023-10-03 DIAGNOSIS — G911 Obstructive hydrocephalus: Secondary | ICD-10-CM | POA: Diagnosis present

## 2023-10-03 DIAGNOSIS — I959 Hypotension, unspecified: Secondary | ICD-10-CM | POA: Diagnosis not present

## 2023-10-03 DIAGNOSIS — G928 Other toxic encephalopathy: Secondary | ICD-10-CM | POA: Diagnosis present

## 2023-10-03 DIAGNOSIS — R4182 Altered mental status, unspecified: Secondary | ICD-10-CM | POA: Diagnosis not present

## 2023-10-03 DIAGNOSIS — Z841 Family history of disorders of kidney and ureter: Secondary | ICD-10-CM

## 2023-10-03 DIAGNOSIS — I444 Left anterior fascicular block: Secondary | ICD-10-CM | POA: Diagnosis present

## 2023-10-03 DIAGNOSIS — N186 End stage renal disease: Secondary | ICD-10-CM | POA: Diagnosis present

## 2023-10-03 DIAGNOSIS — I69954 Hemiplegia and hemiparesis following unspecified cerebrovascular disease affecting left non-dominant side: Secondary | ICD-10-CM

## 2023-10-03 DIAGNOSIS — G9340 Encephalopathy, unspecified: Principal | ICD-10-CM | POA: Diagnosis present

## 2023-10-03 DIAGNOSIS — Z79899 Other long term (current) drug therapy: Secondary | ICD-10-CM

## 2023-10-03 DIAGNOSIS — D631 Anemia in chronic kidney disease: Secondary | ICD-10-CM | POA: Diagnosis present

## 2023-10-03 DIAGNOSIS — E11649 Type 2 diabetes mellitus with hypoglycemia without coma: Secondary | ICD-10-CM | POA: Diagnosis not present

## 2023-10-03 DIAGNOSIS — Z9049 Acquired absence of other specified parts of digestive tract: Secondary | ICD-10-CM

## 2023-10-03 DIAGNOSIS — Z7984 Long term (current) use of oral hypoglycemic drugs: Secondary | ICD-10-CM

## 2023-10-03 DIAGNOSIS — Z8616 Personal history of COVID-19: Secondary | ICD-10-CM

## 2023-10-03 LAB — COMPREHENSIVE METABOLIC PANEL WITH GFR
ALT: 14 U/L (ref 0–44)
AST: 16 U/L (ref 15–41)
Albumin: 3.2 g/dL — ABNORMAL LOW (ref 3.5–5.0)
Alkaline Phosphatase: 74 U/L (ref 38–126)
Anion gap: 16 — ABNORMAL HIGH (ref 5–15)
BUN: 40 mg/dL — ABNORMAL HIGH (ref 6–20)
CO2: 27 mmol/L (ref 22–32)
Calcium: 9.4 mg/dL (ref 8.9–10.3)
Chloride: 94 mmol/L — ABNORMAL LOW (ref 98–111)
Creatinine, Ser: 5.42 mg/dL — ABNORMAL HIGH (ref 0.44–1.00)
GFR, Estimated: 9 mL/min — ABNORMAL LOW (ref >60)
Glucose, Bld: 198 mg/dL — ABNORMAL HIGH (ref 70–99)
Potassium: 5.3 mmol/L — ABNORMAL HIGH (ref 3.5–5.1)
Sodium: 137 mmol/L (ref 135–145)
Total Bilirubin: 0.3 mg/dL (ref 0.0–1.2)
Total Protein: 7.2 g/dL (ref 6.5–8.1)

## 2023-10-03 LAB — RESP PANEL BY RT-PCR (RSV, FLU A&B, COVID)  RVPGX2
Influenza A by PCR: NEGATIVE
Influenza B by PCR: NEGATIVE
Resp Syncytial Virus by PCR: NEGATIVE
SARS Coronavirus 2 by RT PCR: NEGATIVE

## 2023-10-03 LAB — CBC WITH DIFFERENTIAL/PLATELET
Abs Immature Granulocytes: 0.06 K/uL (ref 0.00–0.07)
Basophils Absolute: 0 K/uL (ref 0.0–0.1)
Basophils Relative: 0 %
Eosinophils Absolute: 0 K/uL (ref 0.0–0.5)
Eosinophils Relative: 0 %
HCT: 32.7 % — ABNORMAL LOW (ref 36.0–46.0)
Hemoglobin: 9.8 g/dL — ABNORMAL LOW (ref 12.0–15.0)
Immature Granulocytes: 1 %
Lymphocytes Relative: 11 %
Lymphs Abs: 1.2 K/uL (ref 0.7–4.0)
MCH: 25.7 pg — ABNORMAL LOW (ref 26.0–34.0)
MCHC: 30 g/dL (ref 30.0–36.0)
MCV: 85.6 fL (ref 80.0–100.0)
Monocytes Absolute: 0.6 K/uL (ref 0.1–1.0)
Monocytes Relative: 6 %
Neutro Abs: 8.8 K/uL — ABNORMAL HIGH (ref 1.7–7.7)
Neutrophils Relative %: 82 %
Platelets: 167 K/uL (ref 150–400)
RBC: 3.82 MIL/uL — ABNORMAL LOW (ref 3.87–5.11)
RDW: 16.3 % — ABNORMAL HIGH (ref 11.5–15.5)
WBC: 10.7 K/uL — ABNORMAL HIGH (ref 4.0–10.5)
nRBC: 0 % (ref 0.0–0.2)

## 2023-10-03 LAB — I-STAT CHEM 8, ED
BUN: 42 mg/dL — ABNORMAL HIGH (ref 6–20)
Calcium, Ion: 0.97 mmol/L — ABNORMAL LOW (ref 1.15–1.40)
Chloride: 100 mmol/L (ref 98–111)
Creatinine, Ser: 5.7 mg/dL — ABNORMAL HIGH (ref 0.44–1.00)
Glucose, Bld: 205 mg/dL — ABNORMAL HIGH (ref 70–99)
HCT: 37 % (ref 36.0–46.0)
Hemoglobin: 12.6 g/dL (ref 12.0–15.0)
Potassium: 4.9 mmol/L (ref 3.5–5.1)
Sodium: 133 mmol/L — ABNORMAL LOW (ref 135–145)
TCO2: 26 mmol/L (ref 22–32)

## 2023-10-03 LAB — I-STAT VENOUS BLOOD GAS, ED
Acid-Base Excess: 4 mmol/L — ABNORMAL HIGH (ref 0.0–2.0)
Bicarbonate: 27 mmol/L (ref 20.0–28.0)
Calcium, Ion: 0.97 mmol/L — ABNORMAL LOW (ref 1.15–1.40)
HCT: 34 % — ABNORMAL LOW (ref 36.0–46.0)
Hemoglobin: 11.6 g/dL — ABNORMAL LOW (ref 12.0–15.0)
O2 Saturation: 99 %
Potassium: 4.9 mmol/L (ref 3.5–5.1)
Sodium: 133 mmol/L — ABNORMAL LOW (ref 135–145)
TCO2: 28 mmol/L (ref 22–32)
pCO2, Ven: 33.4 mmHg — ABNORMAL LOW (ref 44–60)
pH, Ven: 7.516 — ABNORMAL HIGH (ref 7.25–7.43)
pO2, Ven: 146 mmHg — ABNORMAL HIGH (ref 32–45)

## 2023-10-03 LAB — ACETAMINOPHEN LEVEL: Acetaminophen (Tylenol), Serum: <10 ug/mL — ABNORMAL LOW (ref 10–30)

## 2023-10-03 LAB — CBG MONITORING, ED: Glucose-Capillary: 202 mg/dL — ABNORMAL HIGH (ref 70–99)

## 2023-10-03 LAB — SALICYLATE LEVEL: Salicylate Lvl: <7 mg/dL — ABNORMAL LOW (ref 7.0–30.0)

## 2023-10-03 LAB — AMMONIA: Ammonia: 17 umol/L (ref 9–35)

## 2023-10-03 LAB — I-STAT CG4 LACTIC ACID, ED
Lactic Acid, Venous: 2 mmol/L (ref 0.5–1.9)
Lactic Acid, Venous: 2 mmol/L (ref 0.5–1.9)

## 2023-10-03 LAB — PROTIME-INR
INR: 1.1 (ref 0.8–1.2)
Prothrombin Time: 14.6 s (ref 11.4–15.2)

## 2023-10-03 LAB — ETHANOL: Alcohol, Ethyl (B): <15 mg/dL (ref <15)

## 2023-10-03 LAB — MAGNESIUM: Magnesium: 2.2 mg/dL (ref 1.7–2.4)

## 2023-10-03 NOTE — Progress Notes (Signed)
 Pt was not in room, taken to MRI. Will return for EEG hook up when schedule permits

## 2023-10-03 NOTE — ED Triage Notes (Signed)
 Patient had one episode of emesis this am at 0400, came back home and found her unresponsive approx one hour ago. Patient on hospital bed at home. Groaning only. Sternal rubbed and patient opened eyes, stuporous. Pupils 1mm equally. Initially 0.5 of narcan, given 2 of narcan total. Patient's husband endorses no pain meds. Stroke patient and dialysis. Left side deficit. Caregiver at home. Dialysis yesterday and vomited once.   BS 220 HR 80 126/70 CO42 O2 97 on 4 liters NSR on EKG  20 RAC

## 2023-10-03 NOTE — ED Provider Triage Note (Signed)
 Emergency Medicine Provider Triage Evaluation Note  Janet Mitchell , a 58 y.o. female  was evaluated in triage.  Presents with altered mental status.  Repeats no ma'am to almost all of the questions.  Unable to tell me her name or where she is currently. Apparently she had an episode of aspiration earlier and husband found her unresponsive later. Easily arousable to painful stimuli and then does stay awake.  Review of Systems  Positive: As above Negative: As above  Physical Exam  BP (!) 123/58   Pulse 80   Temp 99.5 F (37.5 C) (Oral)   Resp 18   Ht 5' 8 (1.727 m)   Wt 119.9 kg   BMI 40.19 kg/m  Gen:   Awake, no distress   Resp:  Normal effort  MSK:   Moves extremities without difficulty  Other:    Medical Decision Making  Medically screening exam initiated at 2:34 PM.  Appropriate orders placed.  Janet Mitchell was informed that the remainder of the evaluation will be completed by another provider, this initial triage assessment does not replace that evaluation, and the importance of remaining in the ED until their evaluation is complete.    Janet Loge, PA-C 10/03/23 1435

## 2023-10-03 NOTE — ED Provider Notes (Signed)
  EMERGENCY DEPARTMENT AT [facility] Provider Note   CSN: [id number] Arrival date & time: 10/03/23  1411     Patient presents with: Altered Mental Status   Janet Mitchell is a 58 y.o. 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  Husband at bedside reports that she had her full dialysis appointment yesterday when she had 1 episode of emesis yesterday. LKW 2330 last evening.  This morning, he he woke up to her having vomiting at 0400.  She was tired and takes sleeping medication each night which is normal for her.  She then returned to sleep following that.  He woke up at 1230 today due to patient being unresponsive with eyes closed.  Patient normally wakes him up at 0830 and did not today.  He reports 1 episode of right arm shaking this morning with eyes open.  Husband at bedside reports patient normally gets around with wheelchair as she has hemiplegia to LUE, LLE from previous stroke. Normally able to speak in full and complete sentences, A&Ox3.  EMS provided Narcan 2 mg total with mild improvement to mentation.   {Add pertinent medical, surgical, social history, OB history to YEP:[id number]}  Altered Mental Status Associated symptoms: vomiting        Prior to Admission medications   Medication Sig Start Date End Date Taking? Authorizing Provider  albuterol  (VENTOLIN  HFA) 108 (90 Base) MCG/ACT inhaler Inhale 2 puffs into the lungs every 6 (six) hours as needed for wheezing or shortness of breath. 05/31/22   Tobie Suzzane POUR, MD  amLODipine  (NORVASC ) 5 MG tablet Take 5 mg by mouth daily.    [provider]  azelastine  (ASTELIN ) 0.1 % nasal spray Place 1 spray into both nostrils 2 (two) times daily. Use in each nostril as directed 11/15/22   Tobie Suzzane POUR, MD  calcitRIOL   (ROCALTROL ) 0.5 MCG capsule Take 1 capsule (0.5 mcg total) by mouth every Monday, Wednesday, and Friday. 08/28/23   Tobie Suzzane POUR, MD  carvedilol  (COREG ) 6.25 MG tablet TAKE ONE TABLET BY MOUTH 2 TIMES A DAY WITH A MEAL 09/04/23   Tobie Suzzane POUR, MD  cetirizine  (ZYRTEC ) 10 MG tablet TAKE ONE TABLET BY MOUTH EVERY DAY 10/02/23   Tobie Suzzane POUR, MD  famotidine  (PEPCID ) 20 MG tablet One after supper 08/29/23   Darlean Ozell NOVAK, MD  glipiZIDE  (GLUCOTROL  XL) 5 MG 24 hr tablet Take 1 tablet (5 mg total) by mouth daily with breakfast. 07/13/23   Tobie Suzzane POUR, MD  levofloxacin  (LEVAQUIN ) 250 MG tablet Take 1 tablet (250 mg total) by mouth every other day. After dialysis. 08/10/23   Patel, Rutwik K, MD  multivitamin (RENA-VIT) TABS tablet TAKE ONE TABLET BY MOUTH EVERY DAY 09/18/23   Tobie Suzzane POUR, MD  pantoprazole  (PROTONIX ) 40 MG tablet Take 1 tablet (40 mg total) by mouth daily. Patient taking differently: Take 40 mg by mouth daily as needed (for acid reflux). 03/02/22   Cindie Carlin POUR, DO  predniSONE  (DELTASONE ) 10 MG tablet Take  4 each am x 2 days,   2 each am x 2 days,  1 each am x 2 days and stop 08/29/23   Wert, Michael B, MD  sevelamer  carbonate (RENVELA ) 800 MG tablet 1,600 mg 3 (three) times daily with meals. 04/15/22   [provider]  traMADol (ULTRAM) 50 MG tablet Take 50 mg by mouth 2 (two) times daily as needed. 11/12/21   [provider]  traZODone  (DESYREL ) 50 MG tablet TAKE 1/2 TO 1 TABLET BY MOUTH AT BEDTIME AS NEEDED FOR SLEEP 05/15/23   Tobie Suzzane POUR, MD    Allergies: Patient has no known allergies.    Review of Systems  Gastrointestinal:  Positive for vomiting.    Updated Vital Signs BP 126/65   Pulse 76   Temp 98.9 F (37.2 C) (Oral)   Resp 13   Ht 5' 8 (1.727 m)   Wt 119.9 kg   SpO2 100%   BMI 40.19 kg/m   Physical Exam Vitals and nursing note reviewed.  Constitutional:      General: She is not in acute distress.    Appearance: Normal appearance.   HENT:     Head: Normocephalic and atraumatic.  Eyes:     Conjunctiva/sclera: Conjunctivae normal.     Comments: Pinpoint bilaterally  Cardiovascular:     Rate and Rhythm: Normal rate.  Pulmonary:     Effort: Pulmonary effort is normal. No respiratory distress.  Abdominal:     Comments: No obvious abdominal tenderness, ecchymosis.   Skin:    Coloration: Skin is not jaundiced or pale.  Neurological:     Mental Status: Mental status is at baseline. She is confused.     GCS: GCS eye subscore is 4. GCS verbal subscore is 4. GCS motor subscore is 6.     Sensory: Sensation is intact.     Motor: Weakness (LUE, LLE at baseline from previous CVA 2023) present. No tremor.     Deep Tendon Reflexes: Babinski sign absent on the right side. Babinski sign absent on the left side.     Comments: Limited 2/2 patient AMS. Follows commands mostly. Normally weak to LUE, LLE. Sensation 2/2 of BUE, BLE. Normal grip strength of RUE. 5/5 motor of RLE. Occasionally will respond to questions verbally appropriately but mostly will respond with one word inappropriate responses. Unable to test visual fields, pronator drift, bilateral grip strength.     (all labs ordered are listed, but only abnormal results are displayed) Labs Reviewed  COMPREHENSIVE METABOLIC PANEL WITH GFR - Abnormal; Notable for the following components:      Result Value   Potassium 5.3 (*)    Chloride 94 (*)    Glucose, Bld 198 (*)    BUN 40 (*)    Creatinine, Ser 5.42 (*)    Albumin  3.2 (*)    GFR, Estimated 9 (*)    Anion gap 16 (*)    All other components within normal limits  CBC WITH DIFFERENTIAL/PLATELET - Abnormal; Notable for the following components:   WBC 10.7 (*)    RBC 3.82 (*)    Hemoglobin 9.8 (*)    HCT 32.7 (*)    MCH 25.7 (*)    RDW 16.3 (*)    Neutro Abs 8.8 (*)    All other components within normal limits  ACETAMINOPHEN  LEVEL - Abnormal; Notable for the following components:   Acetaminophen  (Tylenol ), Serum  <10 (*)    All other components within normal limits  SALICYLATE LEVEL - Abnormal; Notable for the following components:   Salicylate Lvl <7.0 (*)    All other components within normal limits  I-STAT CG4 LACTIC ACID, ED - Abnormal; Notable for the following components:   Lactic Acid, Venous 2.0 (*)    All other components within normal limits  I-STAT CHEM 8, ED - Abnormal; Notable for the following components:   Sodium 133 (*)    BUN 42 (*)    Creatinine, Ser 5.70 (*)    Glucose, Bld 205 (*)    Calcium , Ion 0.97 (*)    All other components within normal limits  I-STAT VENOUS BLOOD GAS, ED - Abnormal; Notable for the following components:   pH, Ven 7.516 (*)    pCO2, Ven 33.4 (*)    pO2, Ven 146 (*)    Acid-Base Excess 4.0 (*)    Sodium 133 (*)    Calcium , Ion 0.97 (*)    HCT 34.0 (*)    Hemoglobin 11.6 (*)    All other components within normal limits  CBG MONITORING, ED - Abnormal; Notable for the following components:   Glucose-Capillary 202 (*)    All other components within normal limits  I-STAT CG4 LACTIC ACID, ED - Abnormal; Notable for the following components:   Lactic Acid, Venous 2.0 (*)    All other components within normal limits  RESP PANEL BY RT-PCR (RSV, FLU A&B, COVID)  RVPGX2  CULTURE, BLOOD (ROUTINE X 2)  CULTURE, BLOOD (ROUTINE X 2)  PROTIME-INR  AMMONIA  ETHANOL  MAGNESIUM     EKG: EKG Interpretation Date/Time:  Tuesday October 03 2023 14:22:41 EDT Ventricular Rate:  81 PR Interval:  161 QRS Duration:  101 QT Interval:  366 QTC Calculation: 425 R Axis:   -55  Text Interpretation: Sinus rhythm Left anterior fascicular block Posterior infarct, old Minimal ST depression, anterolateral leads Confirmed by Yolande Charleston [phone]) on 10/03/2023 7:00:37 PM  Radiology: MR BRAIN WO CONTRAST Result Date: 10/03/2023 CLINICAL DATA:  Initial evaluation for acute altered mental status. EXAM: MRI HEAD WITHOUT CONTRAST TECHNIQUE: Multiplanar, multiecho pulse  sequences of the brain and surrounding structures were obtained without intravenous contrast. COMPARISON:  CT from earlier the same day as well as prior studies. FINDINGS: Brain: Examination technically limited by susceptibility artifact from a right-sided VP shunt. Right frontal approach VP shunt catheter in place with tip terminating in the left lateral ventricle. No hydrocephalus. Cerebral volume within normal limits. Patchy T2/FLAIR hyperintensity involving the periventricular white matter, consistent with chronic small vessel ischemic disease. Sequelae of remote hemorrhage at the right thalamus with probable intraventricular extension. Changes of wallerian degeneration at the right cerebral peduncle. No visible foci of restricted diffusion to suggest acute or subacute infarct. No visible areas of chronic cortical infarction. No acute intracranial hemorrhage. Multiple additional scattered chronic micro hemorrhages noted, likely hypertensive in nature. No visible mass lesion. No mass effect or midline shift. No extra-axial fluid collection. Pituitary gland within normal limits. Vascular: Major intracranial vascular flow voids are maintained. Skull and upper cervical spine: Craniocervical junction within normal limits. Diffuse loss of normal bone marrow signal, nonspecific but can be seen with anemia, smoking, obesity, and infiltrative/myelofibrotic marrow processes. No acute scalp soft tissue abnormality. Sinuses/Orbits: Prior bilateral ocular lens replacement. Paranasal sinuses are largely clear. No mastoid effusion. Other: None. IMPRESSION: 1. No acute intracranial abnormality. 2. Right frontal approach VP shunt catheter in place with tip terminating in the left lateral ventricle. No hydrocephalus. 3. Sequelae of remote hemorrhage at the right thalamus with intraventricular extension. 4. Underlying chronic microvascular ischemic disease. Electronically Signed   By: Morene Hoard M.D.   On: 10/03/2023  22:06   CT Head Wo Contrast Result Date: 10/03/2023 CLINICAL DATA:  Provided history: Mental status change, unknown cause. EXAM: CT HEAD WITHOUT CONTRAST TECHNIQUE: Contiguous axial images  were obtained from the base of the skull through the vertex without intravenous contrast. RADIATION DOSE REDUCTION: This exam was performed according to the departmental dose-optimization program which includes automated exposure control, adjustment of the mA and/or kV according to patient size and/or use of iterative reconstruction technique. COMPARISON:  Prior head CT examinations 07/01/2023 and earlier. Prior brain MRI examinations 03/04/2022 and earlier. FINDINGS: Brain: Generalized cerebral atrophy. Right frontal approach ventricular shunt catheter again demonstrated. As before, the tip of the catheter terminates near the left foramen of Monro. Unchanged size and configuration of the ventricular system. Chronic region of hyperdensity in the right thalamus from prior parenchymal hemorrhage at this site. Patchy and ill-defined hypoattenuation within the cerebral white matter, nonspecific but compatible with mild chronic small vessel ischemic disease. There is no acute intracranial hemorrhage. No acute  demarcated cortical infarct. No extra-axial fluid collection. No evidence of an intracranial mass. No midline shift. Vascular: No hyperdense vessel.  Atherosclerotic calcifications. Skull: No calvarial fracture or aggressive osseous lesion. Sinuses/Orbits: No mass or acute finding within the imaged orbits. No significant paranasal sinus disease. IMPRESSION: 1.  No evidence of an acute intracranial abnormality. 2. Unchanged position of a right frontal approach ventricular shunt catheter with tip terminating near the left foramen of Monro. Unchanged size and configuration of the ventricular system. 3. Chronic region of hyperdensity within the right thalamus at site of prior parenchymal hemorrhage. 4. Background parenchymal  atrophy and chronic small vessel ischemic disease. Electronically Signed   By: Rockey Childs D.O.   On: 10/03/2023 16:19   DG Chest Port 1 View Result Date: 10/03/2023 CLINICAL DATA:  Questionable sepsis - evaluate for abnormality EXAM: PORTABLE CHEST - 1 VIEW COMPARISON:  Jul 31, 2023 FINDINGS: Both lung apices are mostly obscured by the patient's chin. Low lung volumes. Similar hazy nodularity throughout both lungs. No lobar consolidation, pleural effusion, or pneumothorax. Mild cardiomegaly. Aortic atherosclerosis. No acute fracture or destructive lesions. Multilevel thoracic osteophytosis. Ventriculoperitoneal shunt catheter tubing, partially visualized. IMPRESSION: Similar, hazy nodularity diffusely throughout both lungs. Otherwise, no acute cardiopulmonary abnormality. Electronically Signed   By: Rogelia Myers M.D.   On: 10/03/2023 15:11     Medications Ordered in the ED - No data to display    {Click here for ABCD2, HEART and other calculators REFRESH Note before signing:1}                              Medical Decision Making Amount and/or Complexity of Data Reviewed Radiology: ordered.   Patient presents to the ED for concern of AMS, this involves an extensive number of treatment options, and is a complaint that carries with it a high risk of complications and morbidity.  The differential diagnosis includes seizure, CVA, drug toxidrome, UTI, aspiration pneumonia,   Co morbidities that complicate the patient evaluation  See HPI   Additional history obtained:  Additional history obtained from Family, Nursing, and Outside Medical Records   External records from outside source obtained and reviewed including triage note, husband at bedside    Lab Tests:  I Ordered, and personally interpreted labs.  The pertinent results include:      Imaging Studies ordered:  I ordered imaging studies including CT head, chest x-ray, MRI brain I independently visualized and interpreted  imaging which showed  Similar hazy nodularity diffusely throughout both lungs, otherwise, no acute cardiopulmonary abnormality No evidence of an acute intracranial abnormality. Unchanged position of a right frontal  approach ventricular shunt catheter with tip terminating near the left foramen of Monro. Unchanged size and configuration of the ventricular system. Chronic region of hyperdensity within the right thalamus at site of prior parenchymal hemorrhage. Background parenchymal atrophy and chronic small vessel ischemic disease. I agree with the radiologist interpretation   Cardiac Monitoring:  The patient was maintained on a cardiac monitor.  I personally viewed and interpreted the cardiac monitored which showed an underlying rhythm of: NSR with inverted T wave in lead I, mild ST depression in lateral leads as per previous EKGs.   Medicines ordered and prescription drug management:  I ordered medication including ***  for ***  Reevaluation of the patient after these medicines showed that the patient {resolved/improved/worsened:23923::improved} I have reviewed the patients home medicines and have made adjustments as needed   Test Considered:  ***    Consultations Obtained:  I requested consultation with neurology Dr. Jerrie,  and discussed lab and imaging findings as well as pertinent plan - they recommend Jorene Last individually assessed patient and recommends MRI, EEG, admission, infectious workuo See her note   Problem List / ED Course:  AMS    Reevaluation:  After the interventions noted above, I reevaluated the patient and found that they have :stayed the same     Dispostion:  After consideration of the diagnostic results and the patients response to treatment, I feel that the patent would benefit from admission for AMS/encephalopathy.   Discussed ED workup, disposition with family. They express understanding agrees with plan.  All questions answered to  their satisfaction.  They are agreeable to plan.    Final diagnoses:  None    ED Discharge Orders     None

## 2023-10-03 NOTE — ED Provider Notes (Signed)
.  Ultrasound ED Peripheral IV (Provider)  Date/Time: 10/03/2023 3:38 PM  Performed by: Yolande Lamar BROCKS, MD Authorized by: Yolande Lamar BROCKS, MD   Procedure details:    Indications: multiple failed IV attempts     Skin Prep: chlorhexidine  gluconate     Location:  Right AC   Angiocath:  20 G   Bedside Ultrasound Guided: Yes     Images: not archived     Patient tolerated procedure without complications: Yes     Dressing applied: Yes       Yolande Lamar BROCKS, MD 10/03/23 1538

## 2023-10-03 NOTE — ED Notes (Signed)
 Patient transported to CT

## 2023-10-03 NOTE — ED Notes (Signed)
 Pt O2 saturation noted to be low at approx 85-88% RA. Pt placed on [place] O2 and pt it now 100% on 2 LPM via [place].

## 2023-10-03 NOTE — Consult Note (Signed)
 NEUROLOGY CONSULT NOTE   Date of service: October 03, 2023 Patient Name: Janet Mitchell MRN:  [id number] DOB:  January 08, 1966 Chief Complaint: Ida Requesting Provider: Yolande Lamar BROCKS, MD  History of Present Illness  Micaylah Bertucci Mitchell is a 58 y.o. female with hx of T2DM, ESRD on HD MWF, anuric at baseline, BMI 40, HTN, and previous left sided deficit from ICH in 2023. She had a prolonged admission in 2023 after an ICH and was eventually discharged with a trach that has since closed and she also had her PEG tube removed. She does have a VP shunt that was placed by Dr. Debby in 08/2021. She presents to the emergency department via EMS after being found minimally responsive by her husband around 1pm. Her husband reported an episode of emesis around 0400. He initially attributed this to her pocketing food. He also reported an episode of arm shaking that lasted a couple of seconds. He states she stopped this when he asked why she was doing it. He has noticed shaking in her legs as well but states he attributed this to restlessness. He states that she is typically oriented, however her daughter states that knowing the day of the week can fluctuate, but she is typically able to state her location, year, age, and recognize family. She states that over the weekend her mom was able to go to a cookout in her wheelchair and was able to talk to family. Her daughter then went out of town and has not seen her mom in the last couple of days. Her husband states that he was told that she did have an episode of emesis during dialysis but they were able to complete the entire session. She has not been wearing her cpap at night. She is taking a full tablet of her trazadone at night on non-dialysis days and a half tablet of trazadone at night on dialysis days.    LKW: 2330 10/02/2023 Modified rankin score: 4-Needs assistance to walk and tend to bodily needs IV Thrombolysis: No, outside of window EVT: No, LVO not  suspected  NIHSS components Score: Comment  1a Level of Conscious 0[x]  1[]  2[]  3[]      1b LOC Questions 0[x]  1[]  2[]       1c LOC Commands 0[x]  1[]  2[]       2 Best Gaze 0[]  1[x]  2[]       3 Visual 0[]  1[x]  2[]  3[]      4 Facial Palsy 0[]  1[x]  2[]  3[]      5a Motor Arm - left 0[]  1[]  2[]  3[]  4[x]  UN[]    5b Motor Arm - Right 0[]  1[x]  2[]  3[]  4[]  UN[]    6a Motor Leg - Left 0[]  1[]  2[]  3[x]  4[]  UN[]    6b Motor Leg - Right 0[]  1[]  2[x]  3[]  4[]  UN[]    7 Limb Ataxia 0[x]  1[]  2[]  UN[]      8 Sensory 0[]  1[]  2[x]  UN[]      9 Best Language 0[x]  1[]  2[]  3[]      10 Dysarthria 0[]  1[x]  2[]  UN[]      11 Extinct. and Inattention 0[]  1[]  2[x]       TOTAL:18       ROS  Comprehensive ROS performed and pertinent positives documented in HPI    Past History   Past Medical History:  Diagnosis Date   Abdominal wall hematoma 12/26/2019   Anemia    Arthritis    CHF (congestive heart failure) (HCC)    pt states she has been cleared of heart failure/disease  Chronic cholecystitis with calculus    COVID-19 03/26/2019   COVID-19 virus infection 03/2019   ESRD (end stage renal disease) on dialysis [facility])    Dialysis M-W-F   Headache    a few/wk (03/09/2018) - no longer having these   History of blood transfusion 10/2017   low blood count (03/09/2018)   Hypertension    OSA (obstructive sleep apnea) 10/04/2022   Respiratory failure requiring intubation (HCC)    Seasonal allergies    Spinal headache    Status post insertion of percutaneous endoscopic gastrostomy (PEG) tube (HCC) 01/26/2022   Type II diabetes mellitus (HCC)     Past Surgical History:  Procedure Laterality Date   A/V SHUNT INTERVENTION Right 09/26/2023   Procedure: A/V SHUNT INTERVENTION;  Surgeon: Lanis Fonda BRAVO, MD;  Location: [facility];  Service: Cardiovascular;  Laterality: Right;   AMPUTATION TOE Left 2013   Great toe   AV FISTULA PLACEMENT Left 07/22/2019   Procedure: LEFT ARM BASILIC ARTERIOVENOUS (AV) FISTULA CREATION;   Surgeon: Oris Krystal FALCON, MD;  Location: [facility] OR;  Service: Vascular;  Laterality: Left;   BASCILIC VEIN TRANSPOSITION Left 10/17/2019   Procedure: LEFT SECOND STAGE BASCILIC VEIN TRANSPOSITION;  Surgeon: Oris Krystal FALCON, MD;  Location: [facility] OR;  Service: Vascular;  Laterality: Left;   BIOPSY  10/24/2018   Procedure: BIOPSY;  Surgeon: Shaaron Lamar HERO, MD;  Location: [facility];  Service: Endoscopy;;  right and left colon   CATARACT EXTRACTION W/ INTRAOCULAR LENS IMPLANT Right    CESAREAN SECTION  1994; 1998   CHOLECYSTECTOMY N/A 03/09/2018   Procedure: LAPAROSCOPIC CHOLECYSTECTOMY WITH INTRAOPERATIVE CHOLANGIOGRAM ERAS PATHWAY;  Surgeon: Belinda Cough, MD;  Location: [facility] OR;  Service: General;  Laterality: N/A;   COLONOSCOPY N/A 10/24/2018   Surgeon: Shaaron Lamar HERO, MD; 15 mm adenomatous polyp in the transverse colon removed, segmental biopsies benign.   EYE SURGERY Left 05/15/2018   Removal of blood in the globe (due to DM)   FLEXIBLE SIGMOIDOSCOPY N/A 11/24/2017   Procedure: FLEXIBLE SIGMOIDOSCOPY;  Surgeon: Shaaron Lamar HERO, MD;  Location: [facility];  Service: Endoscopy;  Laterality: N/A;   IR FLUORO GUIDE CV LINE RIGHT  07/17/2019   IR GASTROSTOMY TUBE MOD SED  09/16/2021   IR PARACENTESIS  07/09/2019   IR US  GUIDE VASC ACCESS RIGHT  07/17/2019   LAPAROSCOPIC CHOLECYSTECTOMY  03/09/2018   LAPAROSCOPIC REVISION VENTRICULAR-PERITONEAL (V-P) SHUNT  09/08/2021   Procedure: LAPAROSCOPIC ASSISTANCE FOR  VENTRICULAR-PERITONEAL (V-P) SHUNT PLACEMENT ;  Surgeon: Debby Dorn MATSU, MD;  Location: [facility] OR;  Service: Neurosurgery;;   PERITONEAL CATHETER INSERTION  2017   POLYPECTOMY  10/24/2018   Procedure: POLYPECTOMY;  Surgeon: Shaaron Lamar HERO, MD;  Location: [facility];  Service: Endoscopy;;   TOTAL HIP ARTHROPLASTY Left 1997   VENOUS ANGIOPLASTY  09/26/2023   Procedure: VENOUS ANGIOPLASTY;  Surgeon: Lanis Fonda BRAVO, MD;  Location: [facility];  Service: Cardiovascular;;    VENTRICULOPERITONEAL SHUNT N/A 09/08/2021   Procedure: SHUNT INSERTION VENTRICULAR-PERITONEAL;  Surgeon: Debby Dorn MATSU, MD;  Location[facility] OR;  Service: Neurosurgery;  Laterality: N/A;    Family History: Family History  Problem Relation Age of Onset   Heart disease Mother    Thrombocytopenia Mother        TTP   Heart failure Father    Kidney disease Paternal Grandfather    Colon cancer Neg Hx     Social History  reports that she has never smoked. She has been exposed  to tobacco smoke. She has never used smokeless tobacco. She reports that she does not drink alcohol  and does not use drugs.  No Known Allergies  Medications  No current facility-administered medications for this encounter.  Current Outpatient Medications:    albuterol  (VENTOLIN  HFA) 108 (90 Base) MCG/ACT inhaler, Inhale 2 puffs into the lungs every 6 (six) hours as needed for wheezing or shortness of breath., Disp: 8 g, Rfl: 2   amLODipine  (NORVASC ) 5 MG tablet, Take 5 mg by mouth daily., Disp: , Rfl:    azelastine  (ASTELIN ) 0.1 % nasal spray, Place 1 spray into both nostrils 2 (two) times daily. Use in each nostril as directed, Disp: 30 mL, Rfl: 5   calcitRIOL  (ROCALTROL ) 0.5 MCG capsule, Take 1 capsule (0.5 mcg total) by mouth every Monday, Wednesday, and Friday., Disp: 30 capsule, Rfl: 0   carvedilol  (COREG ) 6.25 MG tablet, TAKE ONE TABLET BY MOUTH 2 TIMES A DAY WITH A MEAL, Disp: 60 tablet, Rfl: 2   cetirizine  (ZYRTEC ) 10 MG tablet, TAKE ONE TABLET BY MOUTH EVERY DAY, Disp: 30 tablet, Rfl: 0   famotidine  (PEPCID ) 20 MG tablet, One after supper, Disp: 30 tablet, Rfl: 11   glipiZIDE  (GLUCOTROL  XL) 5 MG 24 hr tablet, Take 1 tablet (5 mg total) by mouth daily with breakfast., Disp: 100 tablet, Rfl: 3   levofloxacin  (LEVAQUIN ) 250 MG tablet, Take 1 tablet (250 mg total) by mouth every other day. After dialysis., Disp: 6 tablet, Rfl: 0   multivitamin (RENA-VIT) TABS tablet, TAKE ONE TABLET BY MOUTH EVERY DAY, Disp: 30  tablet, Rfl: 0   pantoprazole  (PROTONIX ) 40 MG tablet, Take 1 tablet (40 mg total) by mouth daily. (Patient taking differently: Take 40 mg by mouth daily as needed (for acid reflux).), Disp: 90 tablet, Rfl: 3   predniSONE  (DELTASONE ) 10 MG tablet, Take  4 each am x 2 days,   2 each am x 2 days,  1 each am x 2 days and stop, Disp: 14 tablet, Rfl: 0   sevelamer  carbonate (RENVELA ) 800 MG tablet, 1,600 mg 3 (three) times daily with meals., Disp: , Rfl:    traMADol (ULTRAM) 50 MG tablet, Take 50 mg by mouth 2 (two) times daily as needed., Disp: , Rfl:    traZODone  (DESYREL ) 50 MG tablet, TAKE 1/2 TO 1 TABLET BY MOUTH AT BEDTIME AS NEEDED FOR SLEEP, Disp: 30 tablet, Rfl: 3  Vitals   Vitals:   10/03/23 1428 10/03/23 1430 10/03/23 1445 10/03/23 1500  BP:  (!) 127/94 (!) 141/110 (!) 139/119  Pulse:  81 82   Resp:  (!) 30 (!) 25 (!) 23  Temp: 99.5 F (37.5 C)     TempSrc: Oral     SpO2:  100% 98%   Weight:      Height:        Body mass index is 40.19 kg/m.   Physical Exam   Constitutional: Appears well-developed and well-nourished.  Psych: Pleasantly confused Eyes: No scleral injection.  HENT: No OP obstruction.  Head: Normocephalic.  Cardiovascular: Normal rate and regular rhythm.  Respiratory: Effort normal, non-labored breathing.  GI: Soft.  No distension. There is no tenderness.  Skin: WDI.   Neurologic Examination   Neuro: Mental Status: Patient is awake, oriented to self, age,  states she is at [facility] because her niece is getting an ultrasound. On second ask she states she is at home. Does not remember EMS bringing her to the hospital, does not remember going to  bed last night  Mild dysarthria  Left sided neglect  Cranial Nerves: II: PERRL, blinks to threat in all quadrants and orients to stimuli in all 4 quadrants, does not identify fingers in the left visual fields on NP evaluation but did for MD III,IV, VI: Left gaze is incomplete, but she does cross midline   V: Facial sensation is symmetric to temperature VII: Facial movement is symmetric resting and smiling VIII: Hearing is intact to voice X: Palate elevates symmetrically XI: Shoulder shrug is symmetric. XII: Tongue protrudes midline without atrophy or fasciculations.  Motor: Bulk is normal. Tone is increased on the left  RUE at least 4/5 LUE 0/5 RLE 3/5  LLE 2/5 Sensory: Endorses symmetric sensation however she does have some left/right confusion and answers are inconsistent. Repeatedly states left when both or right extremity is being touched  Cerebellar: No ataxia with RUE     Labs/Imaging/Neurodiagnostic studies   CBC:  Recent Labs  Lab 10/11/23 1536  HGB 12.6  11.6*  HCT 37.0  34.0*   Basic Metabolic Panel:  Lab Results  Component Value Date   NA 133 (L) 10-11-2023   NA 133 (L) 10-11-23   K 4.9 10/11/2023   K 4.9 Oct 11, 2023   CO2 28 07/01/2023   GLUCOSE 205 (H) 11-Oct-2023   BUN 42 (H) 10-11-2023   CREATININE 5.70 (H) 10/11/23   CALCIUM  9.6 07/01/2023   GFRNONAA 15 (L) 07/01/2023   GFRAA 11 (L) 08/29/2019   Lipid Panel:  Lab Results  Component Value Date   LDLCALC 38 08/23/2021   HgbA1c:  Lab Results  Component Value Date   HGBA1C 5.8 (H) 08/10/2023   Alcohol  Level     Component Value Date/Time   [facility] <15 10/11/2023 1437   INR  Lab Results  Component Value Date   INR 1.1 2023/10/11   APTT  Lab Results  Component Value Date   APTT 34 07/01/2023    CT Head without contrast(Personally reviewed): 1.  No evidence of an acute intracranial abnormality. 2. Unchanged position of a right frontal approach ventricular shunt catheter with tip terminating near the left foramen of Monro. Unchanged size and configuration of the ventricular system. 3. Chronic region of hyperdensity within the right thalamus at site of prior parenchymal hemorrhage. 4. Background parenchymal atrophy and chronic small vessel ischemic disease.   MRI Brain(Personally  reviewed): No acute stroke on neurology MD review, radiology report pending  Neurodiagnostics rEEG:  Pending   ASSESSMENT   Kaoru Benda Mitchell is a 58 y.o. female 2DM, ESRD, BMI 40, HTN, and previous left sided deficit from ICH in 2023. She has since had her trach and PEG tube removed. She does have a VP shunt from her ICH in 2023. She is being seen for altered mental status. There is some concern for focal seizure vs metabolic encephalopathy given her history and report from family. We have a low threshold to start AEDs given her history, however her husband states that she has been able to stop the shaking that he has witnessed when he asks her to, so will not start antiseizure medications at this time as this is not clearly consistent with focal seizure. MRI is pending as well as EEG.  She does have residual left sided neglect and left hemiparesis. Family states that she is typically more oriented than she is now and she is typically able to have a conversation.  NSGY will check VP shunt in the morning after MRI.   RECOMMENDATIONS  -  Routine EEG  - MRI Brain to rule out acute intracranial process, will need to have neurosurgery reset shunt after MRI brain - UA/UDS if able to obtain - Maintain HD schedule MWF - Infectious work up per EDP  - Neurology will follow-up routine EEG and radiology report on MRI brain otherwise we will be available as needed going forward.  Please do reach out to neurology if there is any further concern for potential seizure activity or other acute neurological questions or concerns arise ______________________________________________________________________    Signed, Jorene Last, NP [facility]  Attending Neurologist's note:  I personally saw this patient, gathering history, performing a full neurologic examination, reviewing relevant labs, personally reviewing relevant imaging including MRI brain, head CT, and formulated the assessment and  plan, adding the note above for completeness and clarity to accurately reflect my thoughts  Lola Jernigan MD-PhD [facility] [phone] Available 7 AM to 7 PM, outside these hours please contact Neurologist on call listed on AMION

## 2023-10-04 ENCOUNTER — Inpatient Hospital Stay (HOSPITAL_COMMUNITY)

## 2023-10-04 ENCOUNTER — Other Ambulatory Visit (HOSPITAL_COMMUNITY)

## 2023-10-04 ENCOUNTER — Encounter (HOSPITAL_COMMUNITY): Payer: Self-pay | Admitting: Internal Medicine

## 2023-10-04 DIAGNOSIS — Z832 Family history of diseases of the blood and blood-forming organs and certain disorders involving the immune mechanism: Secondary | ICD-10-CM | POA: Diagnosis not present

## 2023-10-04 DIAGNOSIS — G928 Other toxic encephalopathy: Secondary | ICD-10-CM | POA: Diagnosis present

## 2023-10-04 DIAGNOSIS — Z79899 Other long term (current) drug therapy: Secondary | ICD-10-CM | POA: Diagnosis not present

## 2023-10-04 DIAGNOSIS — Z7984 Long term (current) use of oral hypoglycemic drugs: Secondary | ICD-10-CM | POA: Diagnosis not present

## 2023-10-04 DIAGNOSIS — R414 Neurologic neglect syndrome: Secondary | ICD-10-CM | POA: Diagnosis present

## 2023-10-04 DIAGNOSIS — Z96642 Presence of left artificial hip joint: Secondary | ICD-10-CM | POA: Diagnosis present

## 2023-10-04 DIAGNOSIS — E872 Acidosis, unspecified: Secondary | ICD-10-CM | POA: Diagnosis present

## 2023-10-04 DIAGNOSIS — G9340 Encephalopathy, unspecified: Secondary | ICD-10-CM | POA: Diagnosis present

## 2023-10-04 DIAGNOSIS — R4182 Altered mental status, unspecified: Secondary | ICD-10-CM

## 2023-10-04 DIAGNOSIS — R569 Unspecified convulsions: Secondary | ICD-10-CM

## 2023-10-04 DIAGNOSIS — E66813 Obesity, class 3: Secondary | ICD-10-CM | POA: Diagnosis present

## 2023-10-04 DIAGNOSIS — Z6841 Body Mass Index (BMI) 40.0 and over, adult: Secondary | ICD-10-CM | POA: Diagnosis not present

## 2023-10-04 DIAGNOSIS — E1122 Type 2 diabetes mellitus with diabetic chronic kidney disease: Secondary | ICD-10-CM | POA: Diagnosis present

## 2023-10-04 DIAGNOSIS — I444 Left anterior fascicular block: Secondary | ICD-10-CM | POA: Diagnosis present

## 2023-10-04 DIAGNOSIS — I69354 Hemiplegia and hemiparesis following cerebral infarction affecting left non-dominant side: Secondary | ICD-10-CM | POA: Diagnosis not present

## 2023-10-04 DIAGNOSIS — G4733 Obstructive sleep apnea (adult) (pediatric): Secondary | ICD-10-CM | POA: Diagnosis present

## 2023-10-04 DIAGNOSIS — Z992 Dependence on renal dialysis: Secondary | ICD-10-CM | POA: Diagnosis not present

## 2023-10-04 DIAGNOSIS — Z8249 Family history of ischemic heart disease and other diseases of the circulatory system: Secondary | ICD-10-CM | POA: Diagnosis not present

## 2023-10-04 DIAGNOSIS — Z8616 Personal history of COVID-19: Secondary | ICD-10-CM | POA: Diagnosis not present

## 2023-10-04 DIAGNOSIS — E11649 Type 2 diabetes mellitus with hypoglycemia without coma: Secondary | ICD-10-CM | POA: Diagnosis not present

## 2023-10-04 DIAGNOSIS — G911 Obstructive hydrocephalus: Secondary | ICD-10-CM | POA: Diagnosis present

## 2023-10-04 DIAGNOSIS — Z9049 Acquired absence of other specified parts of digestive tract: Secondary | ICD-10-CM | POA: Diagnosis not present

## 2023-10-04 DIAGNOSIS — I132 Hypertensive heart and chronic kidney disease with heart failure and with stage 5 chronic kidney disease, or end stage renal disease: Secondary | ICD-10-CM | POA: Diagnosis present

## 2023-10-04 DIAGNOSIS — D631 Anemia in chronic kidney disease: Secondary | ICD-10-CM | POA: Diagnosis present

## 2023-10-04 DIAGNOSIS — N186 End stage renal disease: Secondary | ICD-10-CM | POA: Diagnosis present

## 2023-10-04 DIAGNOSIS — G9341 Metabolic encephalopathy: Secondary | ICD-10-CM | POA: Diagnosis present

## 2023-10-04 DIAGNOSIS — Z982 Presence of cerebrospinal fluid drainage device: Secondary | ICD-10-CM | POA: Diagnosis not present

## 2023-10-04 LAB — TSH: TSH: 2.206 u[IU]/mL (ref 0.350–4.500)

## 2023-10-04 LAB — CBG MONITORING, ED
Glucose-Capillary: 55 mg/dL — ABNORMAL LOW (ref 70–99)
Glucose-Capillary: 49 mg/dL — ABNORMAL LOW (ref 70–99)
Glucose-Capillary: 71 mg/dL (ref 70–99)
Glucose-Capillary: 67 mg/dL — ABNORMAL LOW (ref 70–99)

## 2023-10-04 LAB — CBC
HCT: 30.9 % — ABNORMAL LOW (ref 36.0–46.0)
Hemoglobin: 9.5 g/dL — ABNORMAL LOW (ref 12.0–15.0)
MCH: 25.8 pg — ABNORMAL LOW (ref 26.0–34.0)
MCHC: 30.7 g/dL (ref 30.0–36.0)
MCV: 84 fL (ref 80.0–100.0)
Platelets: 158 K/uL (ref 150–400)
RBC: 3.68 MIL/uL — ABNORMAL LOW (ref 3.87–5.11)
RDW: 16.1 % — ABNORMAL HIGH (ref 11.5–15.5)
WBC: 9.2 K/uL (ref 4.0–10.5)
nRBC: 0 % (ref 0.0–0.2)

## 2023-10-04 LAB — GLUCOSE, CAPILLARY
Glucose-Capillary: 111 mg/dL — ABNORMAL HIGH (ref 70–99)
Glucose-Capillary: 115 mg/dL — ABNORMAL HIGH (ref 70–99)
Glucose-Capillary: 108 mg/dL — ABNORMAL HIGH (ref 70–99)

## 2023-10-04 LAB — PHOSPHORUS: Phosphorus: 6.4 mg/dL — ABNORMAL HIGH (ref 2.5–4.6)

## 2023-10-04 LAB — BASIC METABOLIC PANEL WITH GFR
Anion gap: 11 (ref 5–15)
BUN: 45 mg/dL — ABNORMAL HIGH (ref 6–20)
CO2: 28 mmol/L (ref 22–32)
Calcium: 9 mg/dL (ref 8.9–10.3)
Chloride: 97 mmol/L — ABNORMAL LOW (ref 98–111)
Creatinine, Ser: 5.92 mg/dL — ABNORMAL HIGH (ref 0.44–1.00)
GFR, Estimated: 8 mL/min — ABNORMAL LOW (ref >60)
Glucose, Bld: 75 mg/dL (ref 70–99)
Potassium: 4.6 mmol/L (ref 3.5–5.1)
Sodium: 136 mmol/L (ref 135–145)

## 2023-10-04 LAB — MAGNESIUM: Magnesium: 2.3 mg/dL (ref 1.7–2.4)

## 2023-10-04 LAB — HEPATITIS B SURFACE ANTIGEN: Hepatitis B Surface Ag: NONREACTIVE

## 2023-10-04 LAB — VITAMIN B12: Vitamin B-12: 630 pg/mL (ref 180–914)

## 2023-10-04 LAB — HIV ANTIBODY (ROUTINE TESTING W REFLEX): HIV Screen 4th Generation wRfx: NONREACTIVE

## 2023-10-04 MED ORDER — SODIUM CHLORIDE 0.9% FLUSH
3.0000 mL | Freq: Two times a day (BID) | INTRAVENOUS | Status: DC
Start: 2023-10-04 — End: 2023-10-05
  Administered 2023-10-04: 3 mL via INTRAVENOUS

## 2023-10-04 MED ORDER — CARVEDILOL 6.25 MG PO TABS
6.2500 mg | ORAL_TABLET | Freq: Two times a day (BID) | ORAL | Status: DC
  Administered 2023-10-04: 6.25 mg via ORAL
  Filled 2023-10-04: qty 2

## 2023-10-04 MED ORDER — INSULIN ASPART 100 UNIT/ML IJ SOLN: 0.0000 [IU] | Freq: Three times a day (TID) | INTRAMUSCULAR | Status: DC

## 2023-10-04 MED ORDER — SODIUM CHLORIDE 0.9 % IV BOLUS
250.0000 mL | Freq: Once | INTRAVENOUS | Status: AC
  Administered 2023-10-04: 250 mL via INTRAVENOUS

## 2023-10-04 MED ORDER — ACETAMINOPHEN 500 MG PO TABS: 1000.0000 mg | ORAL_TABLET | Freq: Four times a day (QID) | ORAL | Status: DC | PRN

## 2023-10-04 MED ORDER — DEXTROSE 50 % IV SOLN: 12.5000 g | INTRAVENOUS | Status: AC

## 2023-10-04 MED ORDER — HEPARIN SODIUM (PORCINE) 5000 UNIT/ML IJ SOLN
5000.0000 [IU] | Freq: Three times a day (TID) | INTRAMUSCULAR | Status: DC
Start: 2023-10-04 — End: 2023-10-05
  Filled 2023-10-04 (×2): qty 1

## 2023-10-04 MED ORDER — CHLORHEXIDINE GLUCONATE CLOTH 2 % EX PADS
6.0000 | MEDICATED_PAD | Freq: Every day | CUTANEOUS | Status: DC
  Administered 2023-10-05: 6 via TOPICAL

## 2023-10-04 MED ORDER — ONDANSETRON HCL 4 MG/2ML IJ SOLN: 4.0000 mg | Freq: Four times a day (QID) | INTRAMUSCULAR | Status: DC | PRN

## 2023-10-04 MED ORDER — POLYETHYLENE GLYCOL 3350 17 G PO PACK: 17.0000 g | PACK | Freq: Every day | ORAL | Status: DC | PRN

## 2023-10-04 MED ORDER — MELATONIN 3 MG PO TABS: 6.0000 mg | ORAL_TABLET | Freq: Every evening | ORAL | Status: DC | PRN

## 2023-10-04 MED ORDER — ALBUTEROL SULFATE (2.5 MG/3ML) 0.083% IN NEBU: 2.5000 mg | INHALATION_SOLUTION | RESPIRATORY_TRACT | Status: DC | PRN

## 2023-10-04 MED ORDER — AMLODIPINE BESYLATE 5 MG PO TABS
5.0000 mg | ORAL_TABLET | Freq: Every day | ORAL | Status: DC
  Administered 2023-10-04: 5 mg via ORAL
  Filled 2023-10-04: qty 1

## 2023-10-04 NOTE — ED Provider Notes (Signed)
 Accepted handoff at shift change from [place], [place]. Please see prior provider note for more detail.   Briefly: Patient is 58 y.o.   DDX: concern for encephalopathy, sepsis, stroke, substance use disorder  Plan: Admission to hospitalist for encephalopathy.  Physical Exam  BP 126/65   Pulse 76   Temp 98.9 F (37.2 C) (Oral)   Resp 13   Ht 5' 8 (1.727 m)   Wt 119.9 kg   SpO2 100%   BMI 40.19 kg/m   Physical Exam Vitals and nursing note reviewed.  Constitutional:      General: She is not in acute distress.    Appearance: Normal appearance.  HENT:     Head: Normocephalic and atraumatic.  Eyes:     Conjunctiva/sclera: Conjunctivae normal.     Comments: Pinpoint bilaterally  Cardiovascular:     Rate and Rhythm: Normal rate.  Pulmonary:     Effort: Pulmonary effort is normal. No respiratory distress.  Abdominal:     Comments: No obvious abdominal tenderness, ecchymosis.   Skin:    Coloration: Skin is not jaundiced or pale.  Neurological:     Mental Status: Mental status is at baseline. She is confused.     GCS: GCS eye subscore is 4. GCS verbal subscore is 4. GCS motor subscore is 6.     Sensory: Sensation is intact.     Motor: Weakness (LUE, LLE at baseline from previous CVA 2023) present. No tremor.     Deep Tendon Reflexes: Babinski sign absent on the right side. Babinski sign absent on the left side.     Comments: Limited 2/2 patient AMS. Follows commands mostly. Normally weak to LUE, LLE. Sensation 2/2 of BUE, BLE. Normal grip strength of RUE. 5/5 motor of RLE. Occasionally will respond to questions verbally appropriately but mostly will respond with one word inappropriate responses. Unable to test visual fields, pronator drift, bilateral grip strength.     Procedures  Procedures  ED Course / MDM    Medical Decision Making Amount and/or Complexity of Data Reviewed Radiology: ordered.  Risk Decision regarding hospitalization.   Patient presents the emergency  department with concerns of altered mental status.  Patient pleasant normal was 10 AM yesterday morning.  No prior history of similar symptoms.  Handoff with concerns for possible substance use versus intoxication versus other.  Workup largely reassuring at this time.  Patient had been given Narcan by EMS with no improvement in symptoms.  Will respond to pain with verbal response but otherwise somewhat difficult to arouse.  Spoke with Dr. Segars, hospitalist, who will be admitting patient.       Sereen Schaff A, PA-C 10/04/23 0101    Long, Joshua G, MD 10/05/23 [phone]

## 2023-10-04 NOTE — H&P (Signed)
 History and Physical    Janet Mitchell FMW:[id number] DOB: 02-12-1966 DOA: 10/03/2023  PCP: Tobie Suzzane POUR, MD   Patient coming from: Home   Chief Complaint:  Chief Complaint  Patient presents with   Altered Mental Status    HPI: Limited by mental status  Janet Mitchell is a 58 y.o. female with hx of T2DM, ESRD on HD MWF, anuric at baseline, BMI 40, HTN, and previous left sided deficit from ICH in 2023, obstructive hydrocephalus s/p VP shunt 6/'23, prior trach closed / PEG removed, presented with altered mental status.   During my interview she is arousable and able to maintain very brief alertness.  However does not offer any verbal responses.  She is following commands and can shake her head.   Per ED collateral:    Husband at bedside reports that she had her full dialysis appointment yesterday when she had 1 episode of emesis yesterday. LKW 2330 last evening.  This morning, he he woke up to her having vomiting at 0400.  She was tired and takes sleeping medication each night which is normal for her.  She then returned to sleep following that.  He woke up at 1230 today due to patient being unresponsive with eyes closed.  Patient normally wakes him up at 0830 and did not today.  He reports 1 episode of right arm shaking this morning with eyes open. Husband at bedside reports patient normally gets around with wheelchair as she has hemiplegia to LUE, LLE from previous stroke. Normally able to speak in full and complete sentences, A&Ox3.   Review of Systems:  ROS complete and negative except as marked above   No Known Allergies  Prior to Admission medications   Medication Sig Start Date End Date Taking? Authorizing Provider  albuterol  (VENTOLIN  HFA) 108 (90 Base) MCG/ACT inhaler Inhale 2 puffs into the lungs every 6 (six) hours as needed for wheezing or shortness of breath. 05/31/22   Tobie Suzzane POUR, MD  amLODipine  (NORVASC ) 5 MG tablet Take 5 mg by mouth daily.    [provider]  azelastine  (ASTELIN ) 0.1 % nasal spray Place 1 spray into both nostrils 2 (two) times daily. Use in each nostril as directed 11/15/22   Tobie Suzzane POUR, MD  calcitRIOL  (ROCALTROL ) 0.5 MCG capsule Take 1 capsule (0.5 mcg total) by mouth every Monday, Wednesday, and Friday. 08/28/23   Tobie Suzzane POUR, MD  carvedilol  (COREG ) 6.25 MG tablet TAKE ONE TABLET BY MOUTH 2 TIMES A DAY WITH A MEAL 09/04/23   Tobie Suzzane POUR, MD  cetirizine  (ZYRTEC ) 10 MG tablet TAKE ONE TABLET BY MOUTH EVERY DAY 10/02/23   Tobie Suzzane POUR, MD  famotidine  (PEPCID ) 20 MG tablet One after supper 08/29/23   Darlean Ozell NOVAK, MD  glipiZIDE  (GLUCOTROL  XL) 5 MG 24 hr tablet Take 1 tablet (5 mg total) by mouth daily with breakfast. 07/13/23   Tobie Suzzane POUR, MD  levofloxacin  (LEVAQUIN ) 250 MG tablet Take 1 tablet (250 mg total) by mouth every other day. After dialysis. 08/10/23   Patel, Rutwik K, MD  multivitamin (RENA-VIT) TABS tablet TAKE ONE TABLET BY MOUTH EVERY DAY 09/18/23   Tobie Suzzane POUR, MD  pantoprazole  (PROTONIX ) 40 MG tablet Take 1 tablet (40 mg total) by mouth daily. Patient taking differently: Take 40 mg by mouth daily as needed (for acid reflux). 03/02/22   Cindie Carlin POUR, DO  predniSONE  (DELTASONE ) 10 MG tablet Take  4 each am x 2 days,  2 each am x 2 days,  1 each am x 2 days and stop 08/29/23   Darlean Ozell NOVAK, MD  sevelamer  carbonate (RENVELA ) 800 MG tablet 1,600 mg 3 (three) times daily with meals. 04/15/22   [provider]  traMADol (ULTRAM) 50 MG tablet Take 50 mg by mouth 2 (two) times daily as needed. 11/12/21   [provider]  traZODone  (DESYREL ) 50 MG tablet TAKE 1/2 TO 1 TABLET BY MOUTH AT BEDTIME AS NEEDED FOR SLEEP 05/15/23   Tobie Suzzane POUR, MD    Past Medical History:  Diagnosis Date   Abdominal wall hematoma 12/26/2019   Anemia    Arthritis    CHF (congestive heart failure) (HCC)    pt states she has been cleared of heart failure/disease   Chronic cholecystitis with  calculus    COVID-19 03/26/2019   COVID-19 virus infection 03/2019   ESRD (end stage renal disease) on dialysis [facility])    Dialysis M-W-F   Headache    a few/wk (03/09/2018) - no longer having these   History of blood transfusion 10/2017   low blood count (03/09/2018)   Hypertension    OSA (obstructive sleep apnea) 10/04/2022   Respiratory failure requiring intubation (HCC)    Seasonal allergies    Spinal headache    Status post insertion of percutaneous endoscopic gastrostomy (PEG) tube (HCC) 01/26/2022   Type II diabetes mellitus (HCC)     Past Surgical History:  Procedure Laterality Date   A/V SHUNT INTERVENTION Right 09/26/2023   Procedure: A/V SHUNT INTERVENTION;  Surgeon: Lanis Fonda BRAVO, MD;  Location: [facility];  Service: Cardiovascular;  Laterality: Right;   AMPUTATION TOE Left 2013   Great toe   AV FISTULA PLACEMENT Left 07/22/2019   Procedure: LEFT ARM BASILIC ARTERIOVENOUS (AV) FISTULA CREATION;  Surgeon: Oris Krystal FALCON, MD;  Location: [facility] OR;  Service: Vascular;  Laterality: Left;   BASCILIC VEIN TRANSPOSITION Left 10/17/2019   Procedure: LEFT SECOND STAGE BASCILIC VEIN TRANSPOSITION;  Surgeon: Oris Krystal FALCON, MD;  Location: [facility] OR;  Service: Vascular;  Laterality: Left;   BIOPSY  10/24/2018   Procedure: BIOPSY;  Surgeon: Shaaron Lamar HERO, MD;  Location: [facility];  Service: Endoscopy;;  right and left colon   CATARACT EXTRACTION W/ INTRAOCULAR LENS IMPLANT Right    CESAREAN SECTION  1994; 1998   CHOLECYSTECTOMY N/A 03/09/2018   Procedure: LAPAROSCOPIC CHOLECYSTECTOMY WITH INTRAOPERATIVE CHOLANGIOGRAM ERAS PATHWAY;  Surgeon: Belinda Cough, MD;  Location: [facility] OR;  Service: General;  Laterality: N/A;   COLONOSCOPY N/A 10/24/2018   Surgeon: Shaaron Lamar HERO, MD; 15 mm adenomatous polyp in the transverse colon removed, segmental biopsies benign.   EYE SURGERY Left 05/15/2018   Removal of blood in the globe (due to DM)   FLEXIBLE SIGMOIDOSCOPY N/A 11/24/2017    Procedure: FLEXIBLE SIGMOIDOSCOPY;  Surgeon: Shaaron Lamar HERO, MD;  Location[facility];  Service: Endoscopy;  Laterality: N/A;   IR FLUORO GUIDE CV LINE RIGHT  07/17/2019   IR GASTROSTOMY TUBE MOD SED  09/16/2021   IR PARACENTESIS  07/09/2019   IR US  GUIDE VASC ACCESS RIGHT  07/17/2019   LAPAROSCOPIC CHOLECYSTECTOMY  03/09/2018   LAPAROSCOPIC REVISION VENTRICULAR-PERITONEAL (V-P) SHUNT  09/08/2021   Procedure: LAPAROSCOPIC ASSISTANCE FOR  VENTRICULAR-PERITONEAL (V-P) SHUNT PLACEMENT ;  Surgeon: Debby Dorn MATSU, MD;  Location: [facility] OR;  Service: Neurosurgery;;   PERITONEAL CATHETER INSERTION  2017   POLYPECTOMY  10/24/2018   Procedure: POLYPECTOMY;  Surgeon: Shaaron Lamar HERO,  MD;  Location: [facility];  Service: Endoscopy;;   TOTAL HIP ARTHROPLASTY Left 1997   VENOUS ANGIOPLASTY  09/26/2023   Procedure: VENOUS ANGIOPLASTY;  Surgeon: Lanis Fonda BRAVO, MD;  Location: [facility];  Service: Cardiovascular;;   VENTRICULOPERITONEAL SHUNT N/A 09/08/2021   Procedure: SHUNT INSERTION VENTRICULAR-PERITONEAL;  Surgeon: Debby Dorn MATSU, MD;  Location: [facility] OR;  Service: Neurosurgery;  Laterality: N/A;     reports that she has never smoked. She has been exposed to tobacco smoke. She has never used smokeless tobacco. She reports that she does not drink alcohol  and does not use drugs.  Family History  Problem Relation Age of Onset   Heart disease Mother    Thrombocytopenia Mother        TTP   Heart failure Father    Kidney disease Paternal Grandfather    Colon cancer Neg Hx      Physical Exam: Vitals:   10/04/23 0400 10/04/23 0415 10/04/23 0430 10/04/23 0445  BP: (!) 128/108 (!) 137/54 (!) 138/46 123/64  Pulse: 77 76 73 77  Resp: 15 13 11 13   Temp:      TempSrc:      SpO2: 100% 100% 100% 100%  Weight:      Height:        Gen: Somnolent, arousable to moderate nonpainful stimulation able to open her eyes and look at examiner and maintain very brief alertness before falling back  asleep.  Acutely ill-appearing. HEENT: PERRL, she does not open mouth for examination for tongue bite.  Poor dentition noted. Neck: No meningismus  CV: Regular, normal S1, S2, no murmurs.  Right radial pulse trace.  Resp: Normal WOB, on [place], CTAB  Abd: Morbidly obese, normoactive, nontender MSK: Symmetric, no edema  Skin: Right hand is cool, no rashes or lesions to exposed skin  Neuro: Somnolent, arousable to moderate nonpainful stimulation able to open her eyes and look at examiner and maintain very brief alertness before falling back asleep.  Unable to assess orientation.  She is not offering verbal responses, unable to assess speech.  CN exam is limited PERRL, no facial droop.  Motor is at least 4 out of 5 in the right upper and lower extremity to grip and hip flexion.  Left upper extremity is hypertonic, 0/5 strength in the left upper and lower extremity.  Sensation appears to be intact and localizing to pain Psych: Somnolent, encephalopathic   Data review:   Labs reviewed, notable for:   Lactate 2 K5.3 -> 4.9, bicarb 27, AG 16, BUN 40, blood glucose 190s ESRD Ammonia within normal limit LFT normal WBC 10 Hemoglobin 9.8 Tylenol , salicylate, alcohol  negative  Micro:  Results for orders placed or performed during the hospital encounter of 10/03/23  Resp panel by RT-PCR (RSV, Flu A&B, Covid) Anterior Nasal Swab     Status: None   Collection Time: 10/03/23  2:33 PM   Specimen: Anterior Nasal Swab  Result Value Ref Range Status   SARS Coronavirus 2 by RT PCR NEGATIVE NEGATIVE Final   Influenza A by PCR NEGATIVE NEGATIVE Final   Influenza B by PCR NEGATIVE NEGATIVE Final    Comment: (NOTE) The Xpert Xpress SARS-CoV-2/FLU/RSV plus assay is intended as an aid in the diagnosis of influenza from Nasopharyngeal swab specimens and should not be used as a sole basis for treatment. Nasal washings and aspirates are unacceptable for Xpert Xpress SARS-CoV-2/FLU/RSV testing.  Fact Sheet for  Patients: BloggerCourse.com  Fact Sheet for Healthcare Providers: SeriousBroker.it  This  test is not yet approved or cleared by the [place]  FDA and has been authorized for detection and/or diagnosis of SARS-CoV-2 by FDA under an Emergency Use Authorization (EUA). This EUA will remain in effect (meaning this test can be used) for the duration of the COVID-19 declaration under Section 564(b)(1) of the Act, 21 U.S.C. section 360bbb-3(b)(1), unless the authorization is terminated or revoked.     Resp Syncytial Virus by PCR NEGATIVE NEGATIVE Final    Comment: (NOTE) Fact Sheet for Patients: BloggerCourse.com  Fact Sheet for Healthcare Providers: SeriousBroker.it  This test is not yet approved or cleared by the [place]  FDA and has been authorized for detection and/or diagnosis of SARS-CoV-2 by FDA under an Emergency Use Authorization (EUA). This EUA will remain in effect (meaning this test can be used) for the duration of the COVID-19 declaration under Section 564(b)(1) of the Act, 21 U.S.C. section 360bbb-3(b)(1), unless the authorization is terminated or revoked.  Performed at [facility], [address], [place], [place] [address]     Imaging reviewed:  MR BRAIN WO CONTRAST Result Date: 10/03/2023 CLINICAL DATA:  Initial evaluation for acute altered mental status. EXAM: MRI HEAD WITHOUT CONTRAST TECHNIQUE: Multiplanar, multiecho pulse sequences of the brain and surrounding structures were obtained without intravenous contrast. COMPARISON:  CT from earlier the same day as well as prior studies. FINDINGS: Brain: Examination technically limited by susceptibility artifact from a right-sided VP shunt. Right frontal approach VP shunt catheter in place with tip terminating in the left lateral ventricle. No hydrocephalus. Cerebral volume within normal limits. Patchy T2/FLAIR  hyperintensity involving the periventricular white matter, consistent with chronic small vessel ischemic disease. Sequelae of remote hemorrhage at the right thalamus with probable intraventricular extension. Changes of wallerian degeneration at the right cerebral peduncle. No visible foci of restricted diffusion to suggest acute or subacute infarct. No visible areas of chronic cortical infarction. No acute intracranial hemorrhage. Multiple additional scattered chronic micro hemorrhages noted, likely hypertensive in nature. No visible mass lesion. No mass effect or midline shift. No extra-axial fluid collection. Pituitary gland within normal limits. Vascular: Major intracranial vascular flow voids are maintained. Skull and upper cervical spine: Craniocervical junction within normal limits. Diffuse loss of normal bone marrow signal, nonspecific but can be seen with anemia, smoking, obesity, and infiltrative/myelofibrotic marrow processes. No acute scalp soft tissue abnormality. Sinuses/Orbits: Prior bilateral ocular lens replacement. Paranasal sinuses are largely clear. No mastoid effusion. Other: None. IMPRESSION: 1. No acute intracranial abnormality. 2. Right frontal approach VP shunt catheter in place with tip terminating in the left lateral ventricle. No hydrocephalus. 3. Sequelae of remote hemorrhage at the right thalamus with intraventricular extension. 4. Underlying chronic microvascular ischemic disease. Electronically Signed   By: Morene Hoard M.D.   On: 10/03/2023 22:06   CT Head Wo Contrast Result Date: 10/03/2023 CLINICAL DATA:  Provided history: Mental status change, unknown cause. EXAM: CT HEAD WITHOUT CONTRAST TECHNIQUE: Contiguous axial images were obtained from the base of the skull through the vertex without intravenous contrast. RADIATION DOSE REDUCTION: This exam was performed according to the departmental dose-optimization program which includes automated exposure control, adjustment of  the mA and/or kV according to patient size and/or use of iterative reconstruction technique. COMPARISON:  Prior head CT examinations 07/01/2023 and earlier. Prior brain MRI examinations 03/04/2022 and earlier. FINDINGS: Brain: Generalized cerebral atrophy. Right frontal approach ventricular shunt catheter again demonstrated. As before, the tip of the catheter terminates near the left foramen of Monro. Unchanged size  and configuration of the ventricular system. Chronic region of hyperdensity in the right thalamus from prior parenchymal hemorrhage at this site. Patchy and ill-defined hypoattenuation within the cerebral white matter, nonspecific but compatible with mild chronic small vessel ischemic disease. There is no acute intracranial hemorrhage. No acute  demarcated cortical infarct. No extra-axial fluid collection. No evidence of an intracranial mass. No midline shift. Vascular: No hyperdense vessel.  Atherosclerotic calcifications. Skull: No calvarial fracture or aggressive osseous lesion. Sinuses/Orbits: No mass or acute finding within the imaged orbits. No significant paranasal sinus disease. IMPRESSION: 1.  No evidence of an acute intracranial abnormality. 2. Unchanged position of a right frontal approach ventricular shunt catheter with tip terminating near the left foramen of Monro. Unchanged size and configuration of the ventricular system. 3. Chronic region of hyperdensity within the right thalamus at site of prior parenchymal hemorrhage. 4. Background parenchymal atrophy and chronic small vessel ischemic disease. Electronically Signed   By: Rockey Childs D.O.   On: 10/03/2023 16:19   DG Chest Port 1 View Result Date: 10/03/2023 CLINICAL DATA:  Questionable sepsis - evaluate for abnormality EXAM: PORTABLE CHEST - 1 VIEW COMPARISON:  Jul 31, 2023 FINDINGS: Both lung apices are mostly obscured by the patient's chin. Low lung volumes. Similar hazy nodularity throughout both lungs. No lobar consolidation,  pleural effusion, or pneumothorax. Mild cardiomegaly. Aortic atherosclerosis. No acute fracture or destructive lesions. Multilevel thoracic osteophytosis. Ventriculoperitoneal shunt catheter tubing, partially visualized. IMPRESSION: Similar, hazy nodularity diffusely throughout both lungs. Otherwise, no acute cardiopulmonary abnormality. Electronically Signed   By: Rogelia Myers M.D.   On: 10/03/2023 15:11    EKG:  Personally reviewed, sinus rhythm, LAD, LVH, slight ST depression inferolaterally  ED Course:  Neurology was consulted for altered mental status, recommending for routine EEG, MRI brain, infectious evaluation.    Assessment/Plan:  58 y.o. female with hx T2DM, ESRD on HD MWF, anuric at baseline, BMI 40, HTN, and previous left sided deficit from ICH in 2023, obstructive hydrocephalus s/p VP shunt 6/'23, prior trach closed / PEG removed, presented with altered mental status acute onset with LKWT 7/7 2330; associated N/V and episode of R arm shaking.   Encephalopathy, acute, unclear etiology  LKWT 7/7 2330; somnolent with associated N/V and episode of R arm shaking noted this morning, then decreased responsiveness prompting ED evaluation. On my exam chronic L hemiplegia, no meningismus, arousable but nonverbal. Workup notable chemistries without explanatory cause, Lactate 2. VBG 7.51/33, Ammonia wnl, alcohol  neg, additional serum tox pending. TSH wnl, B12 wnl. Will order cath for UA and culture but may not yield result. WBC this AM 9 and downtrending. Flu / COVID / RSV neg, CXR neg. CT head no acute findings. MRI brain without no acute findings. With report of R arm shaking and hx of prior ICH think seizure and post ictal period may be most likely.  -Neurology consulted by EDP, appreciated -Routine EEG ordered -Per neuro will need VP shunt reactivated by neurosurgery this morning -Hold CNS depressing medications including trazodone   Chronic medical problems: ESRD on MWF HD: Routine  nephrology consult in a.m. for HD management Diabetes type 2: SSI while inpatient Hypertension: Continue home amlodipine , carvedilol . Obesity class III: Would benefit from weight loss outpatient. History of prior ICH '23 obstructive hydrocephalus s/p VP shunt 6/'23: Noted.  Needs VP shunt reactivated by neurosurgery after MRI    Body mass index is 40.19 kg/m.    DVT prophylaxis:  SCDs Code Status:  Full Code Diet:  Diet Orders (  From admission, onward)     Start     Ordered   10/04/23 0132  Diet NPO time specified Except for: Sips with Meds, Ice Chips, Other (See Comments)  Diet effective now       Comments: Sips. Until more awake and alert, notify provider if improved for advancement in diet  Question Answer Comment  Except for Sips with Meds   Except for Ice Chips   Except for Other (See Comments)      10/04/23 0132           Family Communication:  None   Consults:  Neurology   Admission status:   Inpatient, Telemetry bed  Severity of Illness: The appropriate patient status for this patient is INPATIENT. Inpatient status is judged to be reasonable and necessary in order to provide the required intensity of service to ensure the patient's safety. The patient's presenting symptoms, physical exam findings, and initial radiographic and laboratory data in the context of their chronic comorbidities is felt to place them at high risk for further clinical deterioration. Furthermore, it is not anticipated that the patient will be medically stable for discharge from the hospital within 2 midnights of admission.   * I certify that at the point of admission it is my clinical judgment that the patient will require inpatient hospital care spanning beyond 2 midnights from the point of admission due to high intensity of service, high risk for further deterioration and high frequency of surveillance required.*   Dorn Dawson, MD [facility]  How to contact the [facility] Attending or  Consulting provider 7A - 7P or covering provider during after hours 7P -7A, for this patient.  Check the care team in [facility] and look for a) attending/consulting [facility] provider listed and b) the [facility] team listed Log into [email or web address] and use [place]'s universal password to access. If you do not have the password, please contact the hospital operator. Locate the [facility] provider you are looking for under [facility] and page to a number that you can be directly reached. If you still have difficulty reaching the provider, please page the [facility] (Director on Call) for the Hospitalists listed on amion for assistance.  10/04/2023, 5:30 AM

## 2023-10-04 NOTE — Progress Notes (Signed)
 EEG complete - results pending

## 2023-10-04 NOTE — ED Notes (Addendum)
 Unable to catheterize at this time. Bladder scan showed 0ml x3.

## 2023-10-04 NOTE — Procedures (Signed)
 Patient Name: Janet Mitchell  MRN: [id number]  Epilepsy Attending: Arlin MALVA Krebs  Referring Physician/Provider: Remi Pippin, NP  Date: 10/04/2023 Duration: 23.36 mins  Patient history: 58yo F with ams. EEG to evaluate for seizure  Level of alertness: Awake, drowsy  AEDs during EEG study: None  Technical aspects: This EEG study was done with scalp electrodes positioned according to the 10-20 International system of electrode placement. Electrical activity was reviewed with band pass filter of 1-70Hz , sensitivity of 7 uV/mm, display speed of 4mm/sec with a 60Hz  notched filter applied as appropriate. EEG data were recorded continuously and digitally stored.  Video monitoring was available and reviewed as appropriate.  Description: The posterior dominant rhythm consists of 7.5 Hz activity of moderate voltage (25-35 uV) seen predominantly in posterior head regions, symmetric and reactive to eye opening and eye closing. Drowsiness was characterized by attenuation of the posterior background rhythm. EEG showed intermittent generalized 3 to 6 Hz theta-delta slowing. Hyperventilation and photic stimulation were not performed.      ABNORMALITY - Intermittent slow, generalized   IMPRESSION: This study is suggestive of mild diffuse encephalopathy, nonspecific etiology. No seizures or definite epileptiform discharges were seen throughout the recording.   Linlee Cromie O Tyrees Chopin

## 2023-10-04 NOTE — Consult Note (Signed)
 Renal Service Consult Note [facility]  Janet Mitchell 10/04/2023 Janet JONETTA Fret, MD Requesting Physician: Dr. Christobal  Reason for Consult: ESRD patient with confusion and emesis HPI: The patient is a 58 y.o. year-old w/ PMH as below who presented to ED for altered mental status.  Yesterday patient was vomiting in the morning, she went out and came back and the husband found her unresponsive.  Patient is on a hospital bed at home.  Patient was stuporous and was given Narcan without relief.  History of prior stroke with left-sided deficits.  Husband stated normal age she speaks in full and complete sentences.  She does have left-sided hemiplegia which is chronic.  Last dialysis was Monday 7/07.  In the ED blood pressure 125/70, HR 80, RR 16, BS 220, 97% O2 sat on 4 L [place].  Patient was admitted for acute encephalopathy.  We are asked to see for dialysis.   Pt seen in ED room.  Husband is at bedside.  He states the patient's short-term memory comes and goes.  No history of liver disease and is not taking narcotics at home.  Last dialysis was Monday.  She does dialysis at [facility].  She started peritoneal dialysis in 2017 and around 2020 switched over to in center hemodialysis.  No recent dialysis issues except. She did have a fistulogram 8 days ago during which they did find any severe findings but they did do a small balloon procedure to open up a small lesion per the husband.  Patient is not on home oxygen.   ROS - denies CP, no joint pain, no HA, no blurry vision, no rash, no diarrhea, no nausea/ vomiting   Past Medical History  Past Medical History:  Diagnosis Date   Abdominal wall hematoma 12/26/2019   Anemia    Arthritis    CHF (congestive heart failure) (HCC)    pt states she has been cleared of heart failure/disease   Chronic cholecystitis with calculus    COVID-19 03/26/2019   COVID-19 virus infection 03/2019   ESRD (end stage renal disease) on dialysis [facility])     Dialysis M-W-F   Headache    a few/wk (03/09/2018) - no longer having these   History of blood transfusion 10/2017   low blood count (03/09/2018)   Hypertension    OSA (obstructive sleep apnea) 10/04/2022   Respiratory failure requiring intubation (HCC)    Seasonal allergies    Spinal headache    Status post insertion of percutaneous endoscopic gastrostomy (PEG) tube (HCC) 01/26/2022   Type II diabetes mellitus (HCC)    Past Surgical History  Past Surgical History:  Procedure Laterality Date   A/V SHUNT INTERVENTION Right 09/26/2023   Procedure: A/V SHUNT INTERVENTION;  Surgeon: Lanis Fonda BRAVO, MD;  Location: [facility] LAB;  Service: Cardiovascular;  Laterality: Right;   AMPUTATION TOE Left 2013   Great toe   AV FISTULA PLACEMENT Left 07/22/2019   Procedure: LEFT ARM BASILIC ARTERIOVENOUS (AV) FISTULA CREATION;  Surgeon: Oris Krystal FALCON, MD;  Location: [facility] OR;  Service: Vascular;  Laterality: Left;   BASCILIC VEIN TRANSPOSITION Left 10/17/2019   Procedure: LEFT SECOND STAGE BASCILIC VEIN TRANSPOSITION;  Surgeon: Oris Krystal FALCON, MD;  Location: [facility] OR;  Service: Vascular;  Laterality: Left;   BIOPSY  10/24/2018   Procedure: BIOPSY;  Surgeon: Shaaron Janet HERO, MD;  Location: [facility];  Service: Endoscopy;;  right and left colon   CATARACT EXTRACTION W/ INTRAOCULAR LENS IMPLANT Right  CESAREAN SECTION  1994; 1998   CHOLECYSTECTOMY N/A 03/09/2018   Procedure: LAPAROSCOPIC CHOLECYSTECTOMY WITH INTRAOPERATIVE CHOLANGIOGRAM ERAS PATHWAY;  Surgeon: Belinda Cough, MD;  Location: [facility] OR;  Service: General;  Laterality: N/A;   COLONOSCOPY N/A 10/24/2018   Surgeon: Shaaron Janet HERO, MD; 15 mm adenomatous polyp in the transverse colon removed, segmental biopsies benign.   EYE SURGERY Left 05/15/2018   Removal of blood in the globe (due to DM)   FLEXIBLE SIGMOIDOSCOPY N/A 11/24/2017   Procedure: FLEXIBLE SIGMOIDOSCOPY;  Surgeon: Shaaron Janet HERO, MD;  Location: [facility];  Service:  Endoscopy;  Laterality: N/A;   IR FLUORO GUIDE CV LINE RIGHT  07/17/2019   IR GASTROSTOMY TUBE MOD SED  09/16/2021   IR PARACENTESIS  07/09/2019   IR US  GUIDE VASC ACCESS RIGHT  07/17/2019   LAPAROSCOPIC CHOLECYSTECTOMY  03/09/2018   LAPAROSCOPIC REVISION VENTRICULAR-PERITONEAL (V-P) SHUNT  09/08/2021   Procedure: LAPAROSCOPIC ASSISTANCE FOR  VENTRICULAR-PERITONEAL (V-P) SHUNT PLACEMENT ;  Surgeon: Debby Dorn MATSU, MD;  Location: [facility] OR;  Service: Neurosurgery;;   PERITONEAL CATHETER INSERTION  2017   POLYPECTOMY  10/24/2018   Procedure: POLYPECTOMY;  Surgeon: Shaaron Janet HERO, MD;  Location: [facility];  Service: Endoscopy;;   TOTAL HIP ARTHROPLASTY Left 1997   VENOUS ANGIOPLASTY  09/26/2023   Procedure: VENOUS ANGIOPLASTY;  Surgeon: Lanis Fonda BRAVO, MD;  Location: [facility];  Service: Cardiovascular;;   VENTRICULOPERITONEAL SHUNT N/A 09/08/2021   Procedure: SHUNT INSERTION VENTRICULAR-PERITONEAL;  Surgeon: Debby Dorn MATSU, MD;  Location: [facility] OR;  Service: Neurosurgery;  Laterality: N/A;   Family History  Family History  Problem Relation Age of Onset   Heart disease Mother    Thrombocytopenia Mother        TTP   Heart failure Father    Kidney disease Paternal Grandfather    Colon cancer Neg Hx    Social History  reports that she has never smoked. She has been exposed to tobacco smoke. She has never used smokeless tobacco. She reports that she does not drink alcohol  and does not use drugs. Allergies No Known Allergies Home medications Prior to Admission medications   Medication Sig Start Date End Date Taking? Authorizing Provider  albuterol  (VENTOLIN  HFA) 108 (90 Base) MCG/ACT inhaler Inhale 2 puffs into the lungs every 6 (six) hours as needed for wheezing or shortness of breath. 05/31/22   Tobie Suzzane POUR, MD  amLODipine  (NORVASC ) 5 MG tablet Take 5 mg by mouth daily.    [provider]  azelastine  (ASTELIN ) 0.1 % nasal spray Place 1 spray into both nostrils 2 (two)  times daily. Use in each nostril as directed 11/15/22   Tobie Suzzane POUR, MD  calcitRIOL  (ROCALTROL ) 0.5 MCG capsule Take 1 capsule (0.5 mcg total) by mouth every Monday, Wednesday, and Friday. 08/28/23   Tobie Suzzane POUR, MD  carvedilol  (COREG ) 6.25 MG tablet TAKE ONE TABLET BY MOUTH 2 TIMES A DAY WITH A MEAL 09/04/23   Tobie Suzzane POUR, MD  cetirizine  (ZYRTEC ) 10 MG tablet TAKE ONE TABLET BY MOUTH EVERY DAY 10/02/23   Tobie Suzzane POUR, MD  famotidine  (PEPCID ) 20 MG tablet One after supper 08/29/23   Darlean Ozell NOVAK, MD  glipiZIDE  (GLUCOTROL  XL) 5 MG 24 hr tablet Take 1 tablet (5 mg total) by mouth daily with breakfast. 07/13/23   Tobie Suzzane POUR, MD  levofloxacin  (LEVAQUIN ) 250 MG tablet Take 1 tablet (250 mg total) by mouth every other day. After dialysis. 08/10/23   Tobie Suzzane  K, MD  multivitamin (RENA-VIT) TABS tablet TAKE ONE TABLET BY MOUTH EVERY DAY 09/18/23   Tobie Suzzane POUR, MD  pantoprazole  (PROTONIX ) 40 MG tablet Take 1 tablet (40 mg total) by mouth daily. Patient taking differently: Take 40 mg by mouth daily as needed (for acid reflux). 03/02/22   Cindie Carlin POUR, DO  predniSONE  (DELTASONE ) 10 MG tablet Take  4 each am x 2 days,   2 each am x 2 days,  1 each am x 2 days and stop 08/29/23   Wert, Michael B, MD  sevelamer  carbonate (RENVELA ) 800 MG tablet 1,600 mg 3 (three) times daily with meals. 04/15/22   [provider]  traMADol (ULTRAM) 50 MG tablet Take 50 mg by mouth 2 (two) times daily as needed. 11/12/21   [provider]  traZODone  (DESYREL ) 50 MG tablet TAKE 1/2 TO 1 TABLET BY MOUTH AT BEDTIME AS NEEDED FOR SLEEP 05/15/23   Tobie Suzzane POUR, MD     Vitals:   10/04/23 0930 10/04/23 0952 10/04/23 0953 10/04/23 0959  BP: 119/81 (!) 125/58  117/70  Pulse: 80 78 78   Resp: 13 14 12    Temp:      TempSrc:      SpO2: 100% 100% 100%   Weight:      Height:       Exam Gen alert, no distress, nasal cannula O2 at 2 L Morbid obesity No rash, cyanosis or gangrene Sclera  anicteric, throat clear  No jvd or bruits Chest clear bilat to bases, no rales/ wheezing RRR no MRG Abd soft ntnd no mass or ascites +bs GU deferred MS no joint effusions or deformity Ext 1+ pretibial edema bilaterally, no other edema Neuro is alert, Ox 3 , nf, no asterixis    AVF + bruit   CXR port 7/08 --> no sig edema or vasc congestion  OP HD: [facility]  MWF From July 2023 --> 4h  97.5kg   B450   2K  AVF  Heparin  none    Assessment/ Plan: Acute encephalopathy: w/u per pmd ESRD: on HD MWF. Last HD Monday. No urgent indication, plan HD today/ tonight.  HTN: BP's low-normal, agree w/ holding home BP meds. Resume prn.  Volume: mild LE edema, no resp issues. CXR no edema.  Anemia of esrd: Hb 9.8- 12 range, follow      Janet Fret  MD [facility] 10/04/2023, 12:57 PM  Recent Labs  Lab 10/03/23 1433 10/03/23 1536 10/04/23 0356  HGB 9.8* 12.6  11.6* 9.5*  ALBUMIN  3.2*  --   --   CALCIUM  9.4  --  9.0  PHOS  --   --  6.4*  CREATININE 5.42* 5.70* 5.92*  K 5.3* 4.9  4.9 4.6   Inpatient medications:  amLODipine   5 mg Oral Daily   carvedilol   6.25 mg Oral BID WC   dextrose   12.5 g Intravenous STAT   insulin  aspart  0-6 Units Subcutaneous TID WC   sodium chloride  flush  3 mL Intravenous Q12H    acetaminophen , albuterol , melatonin, ondansetron  (ZOFRAN ) IV, polyethylene glycol

## 2023-10-04 NOTE — Progress Notes (Signed)
 Patient seen and examined personally, I reviewed the chart, history and physical and admission note, done by admitting physician this morning and agree with the same with following addendum.  Please refer to the morning admission note for more detailed plan of care.  Briefly, 58 y.o. female with hx of T2DM, ESRD on HD MWF, anuric at baseline, BMI 40, HTN, and previous left sided deficit from ICH in 2023, obstructive hydrocephalus s/p VP shunt 6/'23, prior trach closed / PEG removed, presented with altered mental status.  Per ED usband at bedside reports that she had her full dialysis appointment when she had 1 episode of emesis LKW 2330 last evening PTA and on next morning, when he woke up to her having vomiting at 0400. She was tired and takes sleeping medication each night which is normal for her.  She then returned to sleep following that.He woke up at 1230 on 7/8 due to patient being unresponsive with eyes closed.He reports 1 episode of right arm shaking this morning with eyes open. At baseline patient normally gets around with wheelchair as she has hemiplegia to LUE, LLE from previous stroke. Normally able to speak in full and complete sentences, A&Ox3 On admission on 9/9 early am she was arousable and able to maintain very brief alertness. Workup revealed mild lactic acidosis, hyperkalemia normal pneumonia, LFTs, TSH B12,WBC count 10,hemoglobin 9.8 Tylenol  salicylates at alcohol  negative.  Neurology was consulted and patient underwent further imaging Patient had CT head chest x-ray and MRI brain> no acute findings  Subjective Currently aaox3, husband at bedside and states she is back to baseline EEG is being done  Assessment and plan:  Acute encephalopathy, etiology unclear History of prior ICH in 2023 with obstructive hydrocephalus status post VP shunt 6/23: So far metabolic workup brain imaging with CT MRI negative.  Reported right arm shaking and history of prior ICH question seizure.   Follow-up EEG neurology has been consulted. ?  If she is due to CPAP noncompliance her pco2 is around 33.  As patient underwent MRI will need to change the VP shunt setting with neurosurgery- discussed w/ [facility] neurosurgery- they spoke to her neurosx Dr Debby and per him it is MRI compatible and no need to adjust setting.  Continue supportive care, delirium precaution reorientation and avoid sedatives including trazodone .  ESRD on HD MWF: Nephrology has been consulted for HD management  T2DM w/ mld hypoglycemia: Continue sliding scale insulin .  Started p.o. intake as she was able to eat swallow Recent Labs  Lab 10/03/23 1545 10/04/23 0919 10/04/23 0938 10/04/23 1054  GLUCAP 202* 55* 49* 71     Hypertension: BP is well-controlled this morning continue amlodipine  and carvedilol   Class III obesity with BMI 40.1 will benefit weight loss Sleep apnea: Encourage CPAP use.  Compliance discussed as she has been noncompliant

## 2023-10-04 NOTE — Progress Notes (Signed)
 Pt refused heparin  stating she had bleeding issues when given in dialysis.  MD notified. SCD's on patient.

## 2023-10-04 NOTE — Progress Notes (Signed)
 Pt admitted to 3W19. A & O x 4. Denies pain. Telemetry placed on patient, CCMD called and second verification completed.  Bed alarm set, bed in lowest position, call bell within reach. Spouse at bedside.

## 2023-10-04 NOTE — TOC Initial Note (Signed)
 Transition of Care [facility]) - Initial/Assessment Note    Patient Details  Name: Janet Mitchell MRN: [id number] Date of Birth: 01-31-1966  [facility] CM/SW Contact:    Andrez JULIANNA George, RN Phone Number: 10/04/2023, 3:45 PM  Clinical Narrative:                  Pt is from home with her spouse. He is with her most of the time.  She has needed DME at home. Spouse manages her medications and provides needed transportation. Pt has used [facility] in the past for [facility] services. They would like to use them again. [facility] accepted. Information on the AVS. [facility] will contact her for the first home visit. TOC following.  Expected Discharge Plan: Home w Home Health Services Barriers to Discharge: Continued Medical Work up   Patient Goals and CMS Choice   CMS Medicare.gov Compare Post Acute Care list provided to:: Patient Represenative (must comment) Choice offered to / list presented to : Spouse, Patient      Expected Discharge Plan and Services   Discharge Planning Services: CM Consult Post Acute Care Choice: Home Health Living arrangements for the past 2 months: Single Family Home                           HH Arranged: PT HH Agency: [facility] Date [facility] Agency Contacted: 10/04/23   Representative spoke with at [facility] Agency: [place]  Prior Living Arrangements/Services Living arrangements for the past 2 months: Single Family Home Lives with:: Spouse Patient language and need for interpreter reviewed:: Yes Do you feel safe going back to the place where you live?: Yes        Care giver support system in place?: Yes (comment) Current home services: DME (wheelchair/ hospital bed/ lift) Criminal Activity/Legal Involvement Pertinent to Current Situation/Hospitalization: No - Comment as needed  Activities of Daily Living      Permission Sought/Granted                  Emotional Assessment Appearance:: Appears stated  age Attitude/Demeanor/Rapport: Engaged Affect (typically observed): Accepting Orientation: : Oriented to Self, Oriented to Place, Oriented to  Time, Oriented to Situation   Psych Involvement: No (comment)  Admission diagnosis:  Encephalopathy [G93.40] Acute encephalopathy [G93.40] Patient Active Problem List   Diagnosis Date Noted   Encephalopathy 10/04/2023   Multiple pulmonary nodules determined by computed tomography of lung 08/10/2023   Mixed stress and urge urinary incontinence 02/21/2023   Acute non-recurrent maxillary sinusitis 01/11/2023   Allergic sinusitis 11/15/2022   Encounter for general adult medical examination with abnormal findings 11/15/2022   OSA (obstructive sleep apnea) 10/04/2022   Vitreous hemorrhage, right eye (HCC) 10/04/2022   Acute bronchitis 06/02/2022   Primary insomnia 06/02/2022   Wheelchair dependent 05/31/2022   Left hemiplegia (HCC) 01/27/2022   Thrombocytopenia (HCC) 10/04/2021   History of tracheostomy 09/22/2021   Dysphagia 09/22/2021   Intracranial hemorrhage (HCC) 08/23/2021   Proliferative diabetic retinopathy of right eye determined by examination (HCC) 06/29/2021   Stable treated proliferative diabetic retinopathy of left eye determined by examination associated with type 2 diabetes mellitus (HCC) 06/29/2021   Hyponatremia    ESRD (end stage renal disease) on dialysis [facility])    Essential hypertension 02/14/2019   Obesity, morbid, BMI 50 or higher (HCC) 02/14/2019   Abnormal nuclear stress test 11/02/2018   Rectal bleeding 08/27/2018   Diarrhea 05/22/2018   Anemia 03/25/2018  Chronic cholecystitis with calculus 03/09/2018   Type 2 diabetes mellitus with other specified complication (HCC) 03/09/2018   Pre-transplant evaluation for ESRD (end stage renal disease) 12/19/2017   Dependence on renal dialysis (HCC) 11/06/2017   Diabetic retinopathy (HCC) 01/23/2016   End stage renal disease on dialysis due to type 2 diabetes mellitus (HCC)  01/23/2016   Renal osteodystrophy 01/23/2016   Edema of lower extremity 04/10/2015   Paresthesia of lower extremity 04/10/2015   Recurrent falls 04/10/2015   PCP:  Tobie Suzzane POUR, MD Pharmacy:   [facility] [place], [place] - [address] [place] [address] Phone: [phone] Fax: [phone]     Social Drivers of Health (SDOH) Social History: SDOH Screenings   Food Insecurity: No Food Insecurity (12/20/2022)  Housing: Low Risk  (12/20/2022)  Transportation Needs: No Transportation Needs (12/20/2022)  Utilities: Not At Risk (12/20/2022)  Alcohol  Screen: Low Risk  (12/20/2022)  Depression (PHQ2-9): Low Risk  (08/10/2023)  Financial Resource Strain: Low Risk  (12/20/2022)  Physical Activity: Inactive (12/20/2022)  Social Connections: Moderately Integrated (12/20/2022)  Stress: No Stress Concern Present (12/20/2022)  Tobacco Use: Medium Risk (10/04/2023)  Health Literacy: Adequate Health Literacy (12/20/2022)   SDOH Interventions:     Readmission Risk Interventions     No data to display

## 2023-10-04 NOTE — ED Notes (Signed)
 Pt found to have cbg of 55, provider notified as pt was npo, swallow test passed by pt and pt given 4 oz of juice.

## 2023-10-04 NOTE — Inpatient Diabetes Management (Signed)
 Inpatient Diabetes Program Recommendations  AACE/ADA: New Consensus Statement on Inpatient Glycemic Control (2015)  Target Ranges:  Prepandial:   less than 140 mg/dL      Peak postprandial:   less than 180 mg/dL (1-2 hours)      Critically ill patients:  140 - 180 mg/dL   Lab Results  Component Value Date   GLUCAP 67 (L) 10/04/2023   HGBA1C 5.8 (H) 08/10/2023    Review of Glycemic Control  Latest Reference Range & Units 10/04/23 09:19 10/04/23 09:38 10/04/23 10:54 10/04/23 12:10  Glucose-Capillary 70 - 99 mg/dL 55 (L) 49 (L) 71 67 (L)  (L): Data is abnormally low Diabetes history: Type 2 DM Outpatient Diabetes medications: Glipizide  5 mg QD Current orders for Inpatient glycemic control: Novolog  0-6 units TID  Inpatient Diabetes Program Recommendations:    Given ESRD and hypoglycemia: Do not recommend continuing Glipizide  at discharge.   Thanks, Tinnie Minus, MSN, RNC-OB Diabetes Coordinator [phone] (8a-5p)

## 2023-10-04 NOTE — Hospital Course (Addendum)
 58 y.o. female with hx of T2DM, ESRD on HD MWF, anuric at baseline, BMI 40, HTN, and previous left sided deficit from ICH in 2023, obstructive hydrocephalus s/p VP shunt 6/'23, prior trach closed / PEG removed, presented with altered mental status.  Per ED usband at bedside reports that she had her full dialysis appointment when she had 1 episode of emesis LKW 2330 last evening PTA and on next morning, when he woke up to her having vomiting at 0400. She was tired and takes sleeping medication each night which is normal for her.  She then returned to sleep following that.He woke up at 1230 on 7/8 due to patient being unresponsive with eyes closed.He reports 1 episode of right arm shaking this morning with eyes open. At baseline patient normally gets around with wheelchair as she has hemiplegia to LUE, LLE from previous stroke. Normally able to speak in full and complete sentences, A&Ox3 On admission on 9/9 early am she was arousable and able to maintain very brief alertness. Workup revealed mild lactic acidosis, hyperkalemia normal pneumonia, LFTs, TSH B12,WBC count 10,hemoglobin 9.8 Tylenol  salicylates at alcohol  negative.  Neurology was consulted and patient underwent further imaging Patient had CT head chest x-ray and MRI brain> no acute findings Seen by neurology underwent EEG w/o acute findings. SuspectED toxic/metabolic etiology of event. Specifically given distractibility of the arm shaking reported by husband very low concern that this represents seizure. Inpatient neurology signed off at this time patient is alert awake oriented back to baseline and stable for discharge  Subjective Seen and examined this morning alert and oriented, eager to go home. Due for dialysis this morning  Discharge diagnosis : Acute encephalopathy, etiology unclear History of prior ICH in 2023 with obstructive hydrocephalus status post VP shunt 6/23: So far metabolic workup brain imaging with CT MRI negative.  Reported  right arm shaking and history of prior ICH question seizure.Seen by neurology underwent EEG w/o acute findings. SuspectED toxic/metabolic etiology of event. Specifically given distractibility of the arm shaking reported by husband very low concern that this represents seizure. Inpatient neurology signed off at this time patient is alert awake oriented back to baseline and stable for discharge  ? Ifdue to CPAP noncompliance-discussed need to be compliant.  At this time patient is stable for discharge home  Neurosx team spoke to her neurosx Dr Debby and per him it is MRI compatible and no need to adjust setting for VP shunt post MRI.  ESRD on HD MWF: Nephrology has been consulted. For HD 7/10  T2DM w/ mld hypoglycemia: Blood sugar has improved continue diet and home meds  Recent Labs  Lab 10/04/23 1837 10/04/23 2005 10/04/23 2317 10/05/23 0358 10/05/23 0739  GLUCAP 111* 115* 108* 101* 95    Episode of hypotension on 7/9 Hypertension: BP is well-controlled this morning .medication briefly held . continue amlodipine  and carvedilol   Class III obesity with BMI 40.1 will benefit weight loss Sleep apnea: Encourage CPAP use.  Compliance discussed as she has been noncompliant

## 2023-10-04 NOTE — Evaluation (Signed)
 Physical Therapy Brief Evaluation and Discharge Note Patient Details Name: Janet Mitchell MRN: [id number] DOB: [birth year] Today's Date: 10/04/2023   History of Present Illness  Janet Mitchell is a 58 y.o. female presented with altered mental status. 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 Pt today was able to sit EOB with +2 total A via helicopter method. PTA pt husband reports that pt was WC bound using a hoyer lift for transfers. Pt presents at or near baseline mobility. Pt has no further acute PT needs and will be signing off. Family requesting resumption of HHPT. Re consult PT if mobility status changes.        PT Assessment All further PT needs can be met in the next venue of care  Assistance Needed at Discharge  Frequent or constant Supervision/Assistance    Equipment Recommendations None recommended by PT  Recommendations for Other Services       Precautions/Restrictions Precautions Precautions: Fall Recall of Precautions/Restrictions: Intact Restrictions Weight Bearing Restrictions Per Provider Order: No        Mobility  Bed Mobility   Supine/Sidelying to sit: 2 for physical assistance, Total assist Sit to supine/sidelying: 2 for physical assistance, Total assist General bed mobility comments: +2 total via helicopter method.  Transfers                   General transfer comment: Hoyer at baseline    Ambulation/Gait           General Gait Details: nonambulatory at baseline  Home Activity Instructions    Stairs            Modified Rankin (Stroke Patients Only) Modified Rankin (Stroke Patients Only) Pre-Morbid Rankin Score: Moderately severe disability Modified Rankin: Moderately severe disability      Balance  Overall balance assessment: Needs assistance Sitting-balance support: Single extremity supported, Feet supported Sitting balance-Leahy Scale: Poor                    Pertinent Vitals/Pain PT - Brief Vital Signs All Vital Signs Stable: Yes Pain Assessment Pain Assessment: Faces Faces Pain Scale: No hurt     Home Living Family/patient expects to be discharged to:: Private residence Living Arrangements: Spouse/significant other Available Help at Discharge: Family;Available 24 hours/day Home Environment: Stairs to enter  [employer] of Steps: 3 Home Equipment: Hospital bed;Lift chair;Wheelchair - Careers adviser (comment) Secondary school teacher)   Additional Comments: Pulled from previous admission in 2023    Prior Function Level of Independence: Needs assistance Comments: Hoyer and WC at baseline    UE/LE Assessment   UE ROM/Strength/Tone/Coordination: Impaired UE ROM/Strength/Tone/Coordination Deficits: L sided hemi from previous CVA  LE ROM/Strength/Tone/Coordination: Impaired LE ROM/Strength/Tone/Coordination Deficits: L sided hemi from previous CVA    Communication   Communication Communication: Impaired Factors Affecting Communication: Reduced clarity of speech;Difficulty expressing self     Cognition Overall Cognitive Status: History of cognitive impairments - at baseline       General Comments General comments (skin integrity, edema, etc.): VSS on 2L    Exercises     Assessment/Plan    PT Problem List Decreased strength;Decreased range of motion;Decreased activity tolerance;Decreased balance;Decreased mobility;Decreased cognition;Decreased coordination;Decreased safety awareness;Decreased knowledge of use of DME;Decreased knowledge of precautions;Cardiopulmonary status limiting activity  PT Visit Diagnosis Other abnormalities of gait and mobility (R26.89)    No Skilled PT     Co-evaluation                AMPAC 6 Clicks Help needed turning  from your back to your side while in a flat bed without using bedrails?: Total Help needed moving from lying on your back to sitting on the side of a flat bed without using bedrails?: Total Help needed moving to and from a bed to a chair (including a wheelchair)?: Total Help needed standing up from a chair using your arms (e.g., wheelchair or bedside chair)?: Total Help needed to walk in hospital room?: Total Help needed climbing 3-5 steps with a railing? : Total 6 Click Score: 6      End of Session Equipment Utilized During Treatment: Oxygen Activity Tolerance: Patient tolerated treatment well Patient left: in bed;with call bell/phone within reach;with family/visitor present Nurse Communication: Mobility status PT Visit Diagnosis: Other abnormalities of gait and mobility (R26.89)     Time: 8991-8973 PT Time Calculation (min) (ACUTE ONLY): 18 min  Charges:   PT Evaluation $PT Eval Moderate Complexity: 1 Mod      Dyami Umbach B, PT, DPT Acute Rehab Services [phone]   Rie Mcneil  10/04/2023, 12:13 PM

## 2023-10-05 DIAGNOSIS — G9340 Encephalopathy, unspecified: Secondary | ICD-10-CM | POA: Diagnosis not present

## 2023-10-05 LAB — CBC
HCT: 28.4 % — ABNORMAL LOW (ref 36.0–46.0)
Hemoglobin: 8.9 g/dL — ABNORMAL LOW (ref 12.0–15.0)
MCH: 25.9 pg — ABNORMAL LOW (ref 26.0–34.0)
MCHC: 31.3 g/dL (ref 30.0–36.0)
MCV: 82.6 fL (ref 80.0–100.0)
Platelets: 150 K/uL (ref 150–400)
RBC: 3.44 MIL/uL — ABNORMAL LOW (ref 3.87–5.11)
RDW: 15.3 % (ref 11.5–15.5)
WBC: 6.4 K/uL (ref 4.0–10.5)
nRBC: 0 % (ref 0.0–0.2)

## 2023-10-05 LAB — RENAL FUNCTION PANEL
Albumin: 3.1 g/dL — ABNORMAL LOW (ref 3.5–5.0)
Anion gap: 14 (ref 5–15)
BUN: 61 mg/dL — ABNORMAL HIGH (ref 6–20)
CO2: 24 mmol/L (ref 22–32)
Calcium: 8.9 mg/dL (ref 8.9–10.3)
Chloride: 96 mmol/L — ABNORMAL LOW (ref 98–111)
Creatinine, Ser: 7.89 mg/dL — ABNORMAL HIGH (ref 0.44–1.00)
GFR, Estimated: 6 mL/min — ABNORMAL LOW (ref >60)
Glucose, Bld: 83 mg/dL (ref 70–99)
Phosphorus: 6.9 mg/dL — ABNORMAL HIGH (ref 2.5–4.6)
Potassium: 5 mmol/L (ref 3.5–5.1)
Sodium: 134 mmol/L — ABNORMAL LOW (ref 135–145)

## 2023-10-05 LAB — GLUCOSE, CAPILLARY
Glucose-Capillary: 101 mg/dL — ABNORMAL HIGH (ref 70–99)
Glucose-Capillary: 95 mg/dL (ref 70–99)
Glucose-Capillary: 85 mg/dL (ref 70–99)
Glucose-Capillary: 99 mg/dL (ref 70–99)

## 2023-10-05 LAB — HEPATITIS B SURFACE ANTIBODY, QUANTITATIVE: Hep B S AB Quant (Post): 245 m[IU]/mL

## 2023-10-05 MED ORDER — HEPARIN SODIUM (PORCINE) 1000 UNIT/ML DIALYSIS: 1000.0000 [IU] | INTRAMUSCULAR | Status: DC | PRN

## 2023-10-05 MED ORDER — PENTAFLUOROPROP-TETRAFLUOROETH EX AERO: 1.0000 | INHALATION_SPRAY | CUTANEOUS | Status: DC | PRN

## 2023-10-05 MED ORDER — NEPRO/CARBSTEADY PO LIQD
237.0000 mL | ORAL | Status: DC | PRN
Start: 2023-10-05 — End: 2023-10-05

## 2023-10-05 MED ORDER — ANTICOAGULANT SODIUM CITRATE 4% (200MG/5ML) IV SOLN: 5.0000 mL | Status: DC | PRN

## 2023-10-05 MED ORDER — LIDOCAINE-PRILOCAINE 2.5-2.5 % EX CREA: 1.0000 | TOPICAL_CREAM | CUTANEOUS | Status: DC | PRN

## 2023-10-05 MED ORDER — LIDOCAINE HCL (PF) 1 % IJ SOLN: 5.0000 mL | INTRAMUSCULAR | Status: DC | PRN

## 2023-10-05 MED ORDER — ALTEPLASE 2 MG IJ SOLR: 2.0000 mg | Freq: Once | INTRAMUSCULAR | Status: DC | PRN

## 2023-10-05 NOTE — Plan of Care (Signed)
 EEG and MRI w/o acute findings. Suspect toxic/metabolic etiology of event. Specifically given distractibility of the arm shaking reported by husband very low concern that this represents seizure. Inpatient neurology will sign off, continue outpatient f/u, please reach out with any additional questions/concerns  Lola Jernigan MD-PhD [facility] [phone] Available 7 AM to 7 PM, outside these hours please contact Neurologist on call listed on AMION

## 2023-10-05 NOTE — Progress Notes (Signed)
 Nursing Discharge Note   Name: Janet Mitchell MRN: [id number] DOB: [birth year]    Admit Date:  10/03/2023  Discharge Date:  10/05/2023   Janet Mitchell is to be discharged home per MD order.  AVS completed. Reviewed with patient and family at bedside. Highlighted copy provided for patient to take home.  Patient/caregiver able to verbalize understanding of discharge instructions. PIV removed. Patient stable upon discharge.   Discharge Instructions   None      Discharge Instructions     Discharge instructions   Complete by: As directed    Please call call MD or return to ER for similar or worsening recurring problem that brought you to hospital or if any fever,nausea/vomiting,abdominal pain, uncontrolled pain, chest pain,  shortness of breath or any other alarming symptoms.  Please follow-up your doctor as instructed in a week time and call the office for appointment.  Please avoid alcohol , smoking, or any other illicit substance and maintain healthy habits including taking your regular medications as prescribed.  You were cared for by a hospitalist during your hospital stay. If you have any questions about your discharge medications or the care you received while you were in the hospital after you are discharged, you can call the unit and ask to speak with the hospitalist on call if the hospitalist that took care of you is not available.  Once you are discharged, your primary care physician will handle any further medical issues. Please note that NO REFILLS for any discharge medications will be authorized once you are discharged, as it is imperative that you return to your primary care physician (or establish a relationship with a primary care physician if you do not have one) for your aftercare needs so that they can reassess your need for medications and monitor your lab values   Increase activity slowly   Complete by: As directed    No wound care   Complete by: As directed          Allergies as of 10/05/2023   No Known Allergies      Medication List     STOP taking these medications    levofloxacin  250 MG tablet Commonly known as: Levaquin    predniSONE  10 MG tablet Commonly known as: DELTASONE        TAKE these medications    albuterol  108 (90 Base) MCG/ACT inhaler Commonly known as: VENTOLIN  HFA Inhale 2 puffs into the lungs every 6 (six) hours as needed for wheezing or shortness of breath.   amLODipine  5 MG tablet Commonly known as: NORVASC  Take 5 mg by mouth every evening.   calcitRIOL  0.5 MCG capsule Commonly known as: ROCALTROL  Take 1 capsule (0.5 mcg total) by mouth every Monday, Wednesday, and Friday.   carvedilol  6.25 MG tablet Commonly known as: COREG  TAKE ONE TABLET BY MOUTH 2 TIMES A DAY WITH A MEAL What changed:  when to take this additional instructions   cetirizine  10 MG tablet Commonly known as: ZYRTEC  TAKE ONE TABLET BY MOUTH EVERY DAY   famotidine  20 MG tablet Commonly known as: Pepcid  One after supper What changed:  how much to take how to take this when to take this additional instructions   glipiZIDE  5 MG 24 hr tablet Commonly known as: GLUCOTROL  XL Take 1 tablet (5 mg total) by mouth daily with breakfast.   multivitamin Tabs tablet TAKE ONE TABLET BY MOUTH EVERY DAY   pantoprazole  40 MG tablet Commonly known as: PROTONIX  Take 1 tablet (40 mg  total) by mouth daily. What changed:  when to take this reasons to take this   sevelamer  carbonate 800 MG tablet Commonly known as: RENVELA  1,600 mg 3 (three) times daily with meals.   traMADol 50 MG tablet Commonly known as: ULTRAM Take 50 mg by mouth daily as needed for moderate pain (pain score 4-6) or severe pain (pain score 7-10).   traZODone  50 MG tablet Commonly known as: DESYREL  TAKE 1/2 TO 1 TABLET BY MOUTH AT BEDTIME AS NEEDED FOR SLEEP What changed:  how much to take when to take this         Discharge Instructions/ Education: An  After Visit Summary was printed and given to the patient. Discharge instructions given to patient/family with verbalized understanding. Discharge education completed with patient/family including: follow up instructions, medication list, discharge activities, and limitations if indicated.  Additional discharge instructions as indicated by discharging provider also reviewed.  Patient and family able to verbalize understanding, all questions fully answered. Patient instructed to return to Emergency Department, call 911, or call MD for any changes in condition.   Patient escorted via wheelchair to lobby and discharged home via private automobile.

## 2023-10-05 NOTE — Plan of Care (Signed)
  Problem: Education: Goal: Ability to describe self-care measures that may prevent or decrease complications (Diabetes Survival Skills Education) will improve Outcome: Progressing   Problem: Coping: Goal: Ability to adjust to condition or change in health will improve Outcome: Progressing   Problem: Nutritional: Goal: Progress toward achieving an optimal weight will improve Outcome: Progressing   Problem: Skin Integrity: Goal: Risk for impaired skin integrity will decrease Outcome: Progressing

## 2023-10-05 NOTE — Progress Notes (Signed)
 Transported off unit for hemodialysis at this time.  Transport method: bed Accompanied by: Photographer.

## 2023-10-05 NOTE — TOC Transition Note (Signed)
 Transition of Care [facility]) - Discharge Note   Patient Details  Name: Janet Mitchell MRN: [id number] Date of Birth: [birth year]  Transition of Care [facility]) CM/SW Contact:  Andrez JULIANNA George, RN Phone Number: 10/05/2023, 1:09 PM   Clinical Narrative:     Pt is discharging home with [facility] home health. Information on the AVS.  Pt has needed DME at home.  Pt's spouse will transport home.  Final next level of care: Home w Home Health Services Barriers to Discharge: No Barriers Identified   Patient Goals and CMS Choice   CMS Medicare.gov Compare Post Acute Care list provided to:: Patient Represenative (must comment) Choice offered to / list presented to : Spouse, Patient      Discharge Placement                       Discharge Plan and Services Additional resources added to the After Visit Summary for     Discharge Planning Services: CM Consult Post Acute Care Choice: Home Health                    HH Arranged: PT [facility] Agency: [facility] Date [facility] Agency Contacted: 10/04/23   Representative spoke with at [facility] Agency: [place]  Social Drivers of Health (SDOH) Interventions SDOH Screenings   Food Insecurity: No Food Insecurity (12/20/2022)  Housing: Low Risk  (12/20/2022)  Transportation Needs: No Transportation Needs (12/20/2022)  Utilities: Not At Risk (12/20/2022)  Alcohol  Screen: Low Risk  (12/20/2022)  Depression (PHQ2-9): Low Risk  (08/10/2023)  Financial Resource Strain: Low Risk  (12/20/2022)  Physical Activity: Inactive (12/20/2022)  Social Connections: Moderately Integrated (12/20/2022)  Stress: No Stress Concern Present (12/20/2022)  Tobacco Use: Medium Risk (10/04/2023)  Health Literacy: Adequate Health Literacy (12/20/2022)     Readmission Risk Interventions     No data to display

## 2023-10-05 NOTE — Progress Notes (Signed)
 D/C order noted. Contacted [facility] to be advised of pt's d/c today and that pt should resume care tomorrow. D/C summary and today's renal note faxed to clinic for continuation of care.   Randine Mungo Renal Navigator  [phone]

## 2023-10-05 NOTE — Progress Notes (Signed)
 Discharge ordered noted in chart.  Dr. Christobal stated that patient and her husband/ Calleen were aware and agreeable to discharge.  Call placed to spouse/[name] [phone] to notify him of discharge home.   Calleen stated that he would bring hoyer pads for patient to use and home wheelchair for discharge home.

## 2023-10-05 NOTE — Progress Notes (Signed)
 Arrived back to 3W19 from Hemodialysis.  Transport method: bed  Escorted by: Patient Transporter

## 2023-10-05 NOTE — Discharge Summary (Signed)
 Physician Discharge Summary  Janet Mitchell FMW:[id number] DOB: [birth year] DOA: 10/03/2023  PCP: Tobie Suzzane POUR, MD  Admit date: 10/03/2023 Discharge date: 10/05/2023 Recommendations for Outpatient Follow-up:  Follow up with PCP in 1 weeks-call for appointment Please obtain BMP/CBC in one week  Discharge Dispo: Home Discharge Condition: Stable Code Status:   Code Status: Full Code Diet recommendation:  Diet Order             Diet Carb Modified Fluid consistency: Thin; Room service appropriate? Yes  Diet effective now                    Brief/Interim Summary: 57 y.o. female with hx of T2DM, ESRD on HD MWF, anuric at baseline, BMI 40, HTN, and previous left sided deficit from ICH in 2023, obstructive hydrocephalus s/p VP shunt 6/'23, prior trach closed / PEG removed, presented with altered mental status.  Per ED usband at bedside reports that she had her full dialysis appointment when she had 1 episode of emesis LKW 2330 last evening PTA and on next morning, when he woke up to her having vomiting at 0400. She was tired and takes sleeping medication each night which is normal for her.  She then returned to sleep following that.He woke up at 1230 on 7/8 due to patient being unresponsive with eyes closed.He reports 1 episode of right arm shaking this morning with eyes open. At baseline patient normally gets around with wheelchair as she has hemiplegia to LUE, LLE from previous stroke. Normally able to speak in full and complete sentences, A&Ox3 On admission on 9/9 early am she was arousable and able to maintain very brief alertness. Workup revealed mild lactic acidosis, hyperkalemia normal pneumonia, LFTs, TSH B12,WBC count 10,hemoglobin 9.8 Tylenol  salicylates at alcohol  negative.  Neurology was consulted and patient underwent further imaging Patient had CT head chest x-ray and MRI brain> no acute findings Seen by neurology underwent EEG w/o acute findings. SuspectED toxic/metabolic  etiology of event. Specifically given distractibility of the arm shaking reported by husband very low concern that this represents seizure. Inpatient neurology signed off at this time patient is alert awake oriented back to baseline and stable for discharge  Subjective Seen and examined this morning alert and oriented, eager to go home. Due for dialysis this morning  Discharge diagnosis : Acute encephalopathy, etiology unclear History of prior ICH in 2023 with obstructive hydrocephalus status post VP shunt 6/23: So far metabolic workup brain imaging with CT MRI negative.  Reported right arm shaking and history of prior ICH question seizure.Seen by neurology underwent EEG w/o acute findings. SuspectED toxic/metabolic etiology of event. Specifically given distractibility of the arm shaking reported by husband very low concern that this represents seizure. Inpatient neurology signed off at this time patient is alert awake oriented back to baseline and stable for discharge  ? Ifdue to CPAP noncompliance-discussed need to be compliant.  At this time patient is stable for discharge home  Neurosx team spoke to her neurosx Dr Debby and per him it is MRI compatible and no need to adjust setting for VP shunt post MRI.  ESRD on HD MWF: Nephrology has been consulted. For HD 7/10  T2DM w/ mld hypoglycemia: Blood sugar has improved continue diet and home meds  Recent Labs  Lab 10/04/23 1837 10/04/23 2005 10/04/23 2317 10/05/23 0358 10/05/23 0739  GLUCAP 111* 115* 108* 101* 95    Episode of hypotension on 7/9 Hypertension: BP is well-controlled this morning .medication briefly held .  continue amlodipine  and carvedilol   Class III obesity with BMI 40.1 will benefit weight loss Sleep apnea: Encourage CPAP use.  Compliance discussed as she has been noncompliant   Consults: Neurology Nephrology  Discharge Exam: Vitals:   10/04/23 2315 10/05/23 0358  BP: (!) 140/69 (!) 147/72  Pulse: 72 80   Resp: 18 18  Temp: 97.9 F (36.6 C) (!) 97.4 F (36.3 C)  SpO2: 100% 99%   General: Pt is alert, awake, not in acute distress Cardiovascular: RRR, S1/S2 +, no rubs, no gallops Respiratory: CTA bilaterally, no wheezing, no rhonchi Abdominal: Soft, NT, ND, bowel sounds + Extremities: no edema, no cyanosis  Discharge Instructions  Discharge Instructions     Discharge instructions   Complete by: As directed    Please call call MD or return to ER for similar or worsening recurring problem that brought you to hospital or if any fever,nausea/vomiting,abdominal pain, uncontrolled pain, chest pain,  shortness of breath or any other alarming symptoms.  Please follow-up your doctor as instructed in a week time and call the office for appointment.  Please avoid alcohol , smoking, or any other illicit substance and maintain healthy habits including taking your regular medications as prescribed.  You were cared for by a hospitalist during your hospital stay. If you have any questions about your discharge medications or the care you received while you were in the hospital after you are discharged, you can call the unit and ask to speak with the hospitalist on call if the hospitalist that took care of you is not available.  Once you are discharged, your primary care physician will handle any further medical issues. Please note that NO REFILLS for any discharge medications will be authorized once you are discharged, as it is imperative that you return to your primary care physician (or establish a relationship with a primary care physician if you do not have one) for your aftercare needs so that they can reassess your need for medications and monitor your lab values   Increase activity slowly   Complete by: As directed    No wound care   Complete by: As directed       Allergies as of 10/05/2023   No Known Allergies      Medication List     STOP taking these medications    levofloxacin  250  MG tablet Commonly known as: Levaquin    predniSONE  10 MG tablet Commonly known as: DELTASONE        TAKE these medications    albuterol  108 (90 Base) MCG/ACT inhaler Commonly known as: VENTOLIN  HFA Inhale 2 puffs into the lungs every 6 (six) hours as needed for wheezing or shortness of breath.   amLODipine  5 MG tablet Commonly known as: NORVASC  Take 5 mg by mouth every evening.   calcitRIOL  0.5 MCG capsule Commonly known as: ROCALTROL  Take 1 capsule (0.5 mcg total) by mouth every Monday, Wednesday, and Friday.   carvedilol  6.25 MG tablet Commonly known as: COREG  TAKE ONE TABLET BY MOUTH 2 TIMES A DAY WITH A MEAL What changed:  when to take this additional instructions   cetirizine  10 MG tablet Commonly known as: ZYRTEC  TAKE ONE TABLET BY MOUTH EVERY DAY   famotidine  20 MG tablet Commonly known as: Pepcid  One after supper What changed:  how much to take how to take this when to take this additional instructions   glipiZIDE  5 MG 24 hr tablet Commonly known as: GLUCOTROL  XL Take 1 tablet (5 mg total) by mouth daily  with breakfast.   multivitamin Tabs tablet TAKE ONE TABLET BY MOUTH EVERY DAY   pantoprazole  40 MG tablet Commonly known as: PROTONIX  Take 1 tablet (40 mg total) by mouth daily. What changed:  when to take this reasons to take this   sevelamer  carbonate 800 MG tablet Commonly known as: RENVELA  1,600 mg 3 (three) times daily with meals.   traMADol 50 MG tablet Commonly known as: ULTRAM Take 50 mg by mouth daily as needed for moderate pain (pain score 4-6) or severe pain (pain score 7-10).   traZODone  50 MG tablet Commonly known as: DESYREL  TAKE 1/2 TO 1 TABLET BY MOUTH AT BEDTIME AS NEEDED FOR SLEEP What changed:  how much to take when to take this        Follow-up Information     Debby Dorn MATSU, MD. Schedule an appointment as soon as possible for a visit.   Specialty: Neurosurgery Why: MAKE APPT FOR FOLLOW UP Contact  information: [address] [place] [address] [phone]         [facility] Follow up.   Why: [facility] will contact you for the first home visit Contact information: [phone]        Tobie Suzzane POUR, MD Follow up in 1 week(s).   Specialty: Internal Medicine Contact information: [address] [place] [address] [phone]                No Known Allergies  The results of significant diagnostics from this hospitalization (including imaging, microbiology, ancillary and laboratory) are listed below for reference.    Microbiology: Recent Results (from the past 240 hours)  Resp panel by RT-PCR (RSV, Flu A&B, Covid) Anterior Nasal Swab     Status: None   Collection Time: 10/03/23  2:33 PM   Specimen: Anterior Nasal Swab  Result Value Ref Range Status   SARS Coronavirus 2 by RT PCR NEGATIVE NEGATIVE Final   Influenza A by PCR NEGATIVE NEGATIVE Final   Influenza B by PCR NEGATIVE NEGATIVE Final    Comment: (NOTE) The Xpert Xpress SARS-CoV-2/FLU/RSV plus assay is intended as an aid in the diagnosis of influenza from Nasopharyngeal swab specimens and should not be used as a sole basis for treatment. Nasal washings and aspirates are unacceptable for Xpert Xpress SARS-CoV-2/FLU/RSV testing.  Fact Sheet for Patients: BloggerCourse.com  Fact Sheet for Healthcare Providers: SeriousBroker.it  This test is not yet approved or cleared by the [place]  FDA and has been authorized for detection and/or diagnosis of SARS-CoV-2 by FDA under an Emergency Use Authorization (EUA). This EUA will remain in effect (meaning this test can be used) for the duration of the COVID-19 declaration under Section 564(b)(1) of the Act, 21 U.S.C. section 360bbb-3(b)(1), unless the authorization is terminated or revoked.     Resp Syncytial Virus by PCR NEGATIVE NEGATIVE Final    Comment: (NOTE) Fact Sheet for  Patients: BloggerCourse.com  Fact Sheet for Healthcare Providers: SeriousBroker.it  This test is not yet approved or cleared by the [place]  FDA and has been authorized for detection and/or diagnosis of SARS-CoV-2 by FDA under an Emergency Use Authorization (EUA). This EUA will remain in effect (meaning this test can be used) for the duration of the COVID-19 declaration under Section 564(b)(1) of the Act, 21 U.S.C. section 360bbb-3(b)(1), unless the authorization is terminated or revoked.  Performed at [facility], [address], [place], [place] [address]   Blood Culture (routine x 2)  Status: None (Preliminary result)   Collection Time: 10/03/23  3:20 PM   Specimen: BLOOD RIGHT HAND  Result Value Ref Range Status   Specimen Description BLOOD RIGHT HAND  Final   Special Requests   Final    BOTTLES DRAWN AEROBIC AND ANAEROBIC Blood Culture results may not be optimal due to an inadequate volume of blood received in culture bottles   Culture   Final    NO GROWTH 2 DAYS Performed at [facility], [address], [place], [place] [address]    Report Status PENDING  Incomplete  Blood Culture (routine x 2)     Status: None (Preliminary result)   Collection Time: 10/03/23  3:40 PM   Specimen: BLOOD RIGHT ARM  Result Value Ref Range Status   Specimen Description BLOOD RIGHT ARM  Final   Special Requests   Final    BOTTLES DRAWN AEROBIC AND ANAEROBIC Blood Culture adequate volume   Culture   Final    NO GROWTH 2 DAYS Performed at [facility], [address] [place], [place] [address]    Report Status PENDING  Incomplete    Procedures/Studies: EEG adult Result Date: 10/04/2023 Shelton Arlin KIDD, MD     10/04/2023  1:38 PM Patient Name: Janet Mitchell MRN: [id number] Epilepsy Attending: Arlin KIDD Shelton Referring Physician/Provider: Remi Pippin, NP Date: 10/04/2023 Duration: 23.36 mins Patient history:  58yo F with ams. EEG to evaluate for seizure Level of alertness: Awake, drowsy AEDs during EEG study: None Technical aspects: This EEG study was done with scalp electrodes positioned according to the 10-20 International system of electrode placement. Electrical activity was reviewed with band pass filter of 1-70Hz , sensitivity of 7 uV/mm, display speed of 45mm/sec with a 60Hz  notched filter applied as appropriate. EEG data were recorded continuously and digitally stored.  Video monitoring was available and reviewed as appropriate. Description: The posterior dominant rhythm consists of 7.5 Hz activity of moderate voltage (25-35 uV) seen predominantly in posterior head regions, symmetric and reactive to eye opening and eye closing. Drowsiness was characterized by attenuation of the posterior background rhythm. EEG showed intermittent generalized 3 to 6 Hz theta-delta slowing. Hyperventilation and photic stimulation were not performed.    ABNORMALITY - Intermittent slow, generalized  IMPRESSION: This study is suggestive of mild diffuse encephalopathy, nonspecific etiology. No seizures or definite epileptiform discharges were seen throughout the recording.  Arlin KIDD Shelton   MR BRAIN WO CONTRAST Result Date: 10/03/2023 CLINICAL DATA:  Initial evaluation for acute altered mental status. EXAM: MRI HEAD WITHOUT CONTRAST TECHNIQUE: Multiplanar, multiecho pulse sequences of the brain and surrounding structures were obtained without intravenous contrast. COMPARISON:  CT from earlier the same day as well as prior studies. FINDINGS: Brain: Examination technically limited by susceptibility artifact from a right-sided VP shunt. Right frontal approach VP shunt catheter in place with tip terminating in the left lateral ventricle. No hydrocephalus. Cerebral volume within normal limits. Patchy T2/FLAIR hyperintensity involving the periventricular white matter, consistent with chronic small vessel ischemic disease. Sequelae of  remote hemorrhage at the right thalamus with probable intraventricular extension. Changes of wallerian degeneration at the right cerebral peduncle. No visible foci of restricted diffusion to suggest acute or subacute infarct. No visible areas of chronic cortical infarction. No acute intracranial hemorrhage. Multiple additional scattered chronic micro hemorrhages noted, likely hypertensive in nature. No visible mass lesion. No mass effect or midline shift. No extra-axial fluid collection. Pituitary gland within normal limits. Vascular: Major intracranial vascular flow  voids are maintained. Skull and upper cervical spine: Craniocervical junction within normal limits. Diffuse loss of normal bone marrow signal, nonspecific but can be seen with anemia, smoking, obesity, and infiltrative/myelofibrotic marrow processes. No acute scalp soft tissue abnormality. Sinuses/Orbits: Prior bilateral ocular lens replacement. Paranasal sinuses are largely clear. No mastoid effusion. Other: None. IMPRESSION: 1. No acute intracranial abnormality. 2. Right frontal approach VP shunt catheter in place with tip terminating in the left lateral ventricle. No hydrocephalus. 3. Sequelae of remote hemorrhage at the right thalamus with intraventricular extension. 4. Underlying chronic microvascular ischemic disease. Electronically Signed   By: Morene Hoard M.D.   On: 10/03/2023 22:06   CT Head Wo Contrast Result Date: 10/03/2023 CLINICAL DATA:  Provided history: Mental status change, unknown cause. EXAM: CT HEAD WITHOUT CONTRAST TECHNIQUE: Contiguous axial images were obtained from the base of the skull through the vertex without intravenous contrast. RADIATION DOSE REDUCTION: This exam was performed according to the departmental dose-optimization program which includes automated exposure control, adjustment of the mA and/or kV according to patient size and/or use of iterative reconstruction technique. COMPARISON:  Prior head CT  examinations 07/01/2023 and earlier. Prior brain MRI examinations 03/04/2022 and earlier. FINDINGS: Brain: Generalized cerebral atrophy. Right frontal approach ventricular shunt catheter again demonstrated. As before, the tip of the catheter terminates near the left foramen of Monro. Unchanged size and configuration of the ventricular system. Chronic region of hyperdensity in the right thalamus from prior parenchymal hemorrhage at this site. Patchy and ill-defined hypoattenuation within the cerebral white matter, nonspecific but compatible with mild chronic small vessel ischemic disease. There is no acute intracranial hemorrhage. No acute  demarcated cortical infarct. No extra-axial fluid collection. No evidence of an intracranial mass. No midline shift. Vascular: No hyperdense vessel.  Atherosclerotic calcifications. Skull: No calvarial fracture or aggressive osseous lesion. Sinuses/Orbits: No mass or acute finding within the imaged orbits. No significant paranasal sinus disease. IMPRESSION: 1.  No evidence of an acute intracranial abnormality. 2. Unchanged position of a right frontal approach ventricular shunt catheter with tip terminating near the left foramen of Monro. Unchanged size and configuration of the ventricular system. 3. Chronic region of hyperdensity within the right thalamus at site of prior parenchymal hemorrhage. 4. Background parenchymal atrophy and chronic small vessel ischemic disease. Electronically Signed   By: Rockey Childs D.O.   On: 10/03/2023 16:19   DG Chest Port 1 View Result Date: 10/03/2023 CLINICAL DATA:  Questionable sepsis - evaluate for abnormality EXAM: PORTABLE CHEST - 1 VIEW COMPARISON:  Jul 31, 2023 FINDINGS: Both lung apices are mostly obscured by the patient's chin. Low lung volumes. Similar hazy nodularity throughout both lungs. No lobar consolidation, pleural effusion, or pneumothorax. Mild cardiomegaly. Aortic atherosclerosis. No acute fracture or destructive lesions.  Multilevel thoracic osteophytosis. Ventriculoperitoneal shunt catheter tubing, partially visualized. IMPRESSION: Similar, hazy nodularity diffusely throughout both lungs. Otherwise, no acute cardiopulmonary abnormality. Electronically Signed   By: Rogelia Myers M.D.   On: 10/03/2023 15:11   PERIPHERAL VASCULAR CATHETERIZATION Result Date: 09/26/2023 Patient name: Janet Mitchell MRN: [id number] DOB: 1965-12-13 Sex: female 09/26/2023 Pre-operative Diagnosis: Fistula malfunction Post-operative diagnosis:  Same Surgeon:  Fonda FORBES Rim, MD Procedure Performed: 1.  Left arm fistulogram 2.  8 x 40 mm balloon venoplasty left brachiobasilic fistula 3.  Contrast volume 45 mL Indications: Patient is a 58 year old female with end-stage renal disease currently using left arm brachiobasilic fistula for dialysis.  She was sent to our office due to low flow.  After discussing  risk and benefits of left arm fistulogram in effort to define, and possibly improve flow state she can continue using the left arm for dialysis, the patient elected to proceed Findings: Widely patent arterial anastomosis Focal, greater than 70% stenosis of the brachiobasilic fistula roughly 3 cm from the anastomosis Otherwise, widely patent left brachiobasilic fistula No central stenosis  Procedure:  The patient was identified in the holding area and taken to room 8.  The patient was then placed supine on the table and prepped and draped in the usual sterile fashion.  A time out was called. The patient was accessed in retrograde fashion using a micropuncture needle.  A micropuncture sheath followed.  Next, I performed a left upper extremity fistulogram.  See results above.  I elected to intervene on the greater than 70% stenotic lesion 3 cm from the anastomosis.  A 6 French sheath was placed, followed by 2000 units of heparin . Next, an 8 x 40 mm balloon was brought onto the field and expanded across the lesion for 2 minutes.  Follow-up venogram  demonstrated resolution of flow-limiting stenosis.  No residual stenosis identified.  Monocryl suture was used for access management. The fistula can continue to be used for access. Fonda FORBES Rim MD [facility] Office: [phone]    Labs: BNP (last 3 results) No results for input(s): BNP in the last 8760 hours. Basic Metabolic Panel: Recent Labs  Lab 10/03/23 1433 10/03/23 1536 10/04/23 0356  NA 137 133*  133* 136  K 5.3* 4.9  4.9 4.6  CL 94* 100 97*  CO2 27  --  28  GLUCOSE 198* 205* 75  BUN 40* 42* 45*  CREATININE 5.42* 5.70* 5.92*  CALCIUM  9.4  --  9.0  MG 2.2  --  2.3  PHOS  --   --  6.4*   Liver Function Tests: Recent Labs  Lab 10/03/23 1433  AST 16  ALT 14  ALKPHOS 74  BILITOT 0.3  PROT 7.2  ALBUMIN  3.2*   No results for input(s): LIPASE, AMYLASE in the last 168 hours. Recent Labs  Lab 10/03/23 1437  AMMONIA 17   CBC: Recent Labs  Lab 10/03/23 1433 10/03/23 1536 10/04/23 0356  WBC 10.7*  --  9.2  NEUTROABS 8.8*  --   --   HGB 9.8* 12.6  11.6* 9.5*  HCT 32.7* 37.0  34.0* 30.9*  MCV 85.6  --  84.0  PLT 167  --  158   CBG: Recent Labs  Lab 10/04/23 1837 10/04/23 2005 10/04/23 2317 10/05/23 0358 10/05/23 0739  GLUCAP 111* 115* 108* 101* 95   Hgb A1c No results for input(s): HGBA1C in the last 72 hours. Anemia work up Recent Labs    10/04/23 0356  VITAMINB12 630   Cardiac Enzymes: No results for input(s): CKTOTAL, CKMB, CKMBINDEX, TROPONINI in the last 168 hours. BNP: Invalid input(s): POCBNP D-Dimer No results for input(s): DDIMER in the last 72 hours. Lipid Profile No results for input(s): CHOL, HDL, LDLCALC, TRIG, CHOLHDL, LDLDIRECT in the last 72 hours. Thyroid function studies Recent Labs    10/04/23 0356  TSH 2.206   Urinalysis    Component Value Date/Time   COLORURINE AMBER (A) 07/31/2018 1800   APPEARANCEUR TURBID (A) 07/31/2018 1800   LABSPEC  >1.046 (H) 07/31/2018 1800   PHURINE 5.0 07/31/2018 1800   GLUCOSEU 150 (A) 07/31/2018 1800   HGBUR SMALL (A) 07/31/2018 1800   BILIRUBINUR NEGATIVE 07/31/2018 1800   KETONESUR NEGATIVE 07/31/2018  1800   PROTEINUR >=300 (A) 07/31/2018 1800   NITRITE NEGATIVE 07/31/2018 1800   LEUKOCYTESUR MODERATE (A) 07/31/2018 1800   Sepsis Labs Recent Labs  Lab 10/03/23 1433 10/04/23 0356  WBC 10.7* 9.2   Microbiology Recent Results (from the past 240 hours)  Resp panel by RT-PCR (RSV, Flu A&B, Covid) Anterior Nasal Swab     Status: None   Collection Time: 10/03/23  2:33 PM   Specimen: Anterior Nasal Swab  Result Value Ref Range Status   SARS Coronavirus 2 by RT PCR NEGATIVE NEGATIVE Final   Influenza A by PCR NEGATIVE NEGATIVE Final   Influenza B by PCR NEGATIVE NEGATIVE Final    Comment: (NOTE) The Xpert Xpress SARS-CoV-2/FLU/RSV plus assay is intended as an aid in the diagnosis of influenza from Nasopharyngeal swab specimens and should not be used as a sole basis for treatment. Nasal washings and aspirates are unacceptable for Xpert Xpress SARS-CoV-2/FLU/RSV testing.  Fact Sheet for Patients: BloggerCourse.com  Fact Sheet for Healthcare Providers: SeriousBroker.it  This test is not yet approved or cleared by the [place]  FDA and has been authorized for detection and/or diagnosis of SARS-CoV-2 by FDA under an Emergency Use Authorization (EUA). This EUA will remain in effect (meaning this test can be used) for the duration of the COVID-19 declaration under Section 564(b)(1) of the Act, 21 U.S.C. section 360bbb-3(b)(1), unless the authorization is terminated or revoked.     Resp Syncytial Virus by PCR NEGATIVE NEGATIVE Final    Comment: (NOTE) Fact Sheet for Patients: BloggerCourse.com  Fact Sheet for Healthcare Providers: SeriousBroker.it  This test is not yet approved or  cleared by the [place]  FDA and has been authorized for detection and/or diagnosis of SARS-CoV-2 by FDA under an Emergency Use Authorization (EUA). This EUA will remain in effect (meaning this test can be used) for the duration of the COVID-19 declaration under Section 564(b)(1) of the Act, 21 U.S.C. section 360bbb-3(b)(1), unless the authorization is terminated or revoked.  Performed at [facility], [address], [place], [place] [address]   Blood Culture (routine x 2)     Status: None (Preliminary result)   Collection Time: 10/03/23  3:20 PM   Specimen: BLOOD RIGHT HAND  Result Value Ref Range Status   Specimen Description BLOOD RIGHT HAND  Final   Special Requests   Final    BOTTLES DRAWN AEROBIC AND ANAEROBIC Blood Culture results may not be optimal due to an inadequate volume of blood received in culture bottles   Culture   Final    NO GROWTH 2 DAYS Performed at [facility], [address], [place], [place] [address]    Report Status PENDING  Incomplete  Blood Culture (routine x 2)     Status: None (Preliminary result)   Collection Time: 10/03/23  3:40 PM   Specimen: BLOOD RIGHT ARM  Result Value Ref Range Status   Specimen Description BLOOD RIGHT ARM  Final   Special Requests   Final    BOTTLES DRAWN AEROBIC AND ANAEROBIC Blood Culture adequate volume   Culture   Final    NO GROWTH 2 DAYS Performed at [facility], [address], [place], [place] [address]    Report Status PENDING  Incomplete     Time coordinating discharge: 25 minutes  SIGNED: Mennie LAMY, MD  [facility] 10/05/2023, 8:31 AM  If 7PM-7AM, please contact night-coverage [email or web address]

## 2023-10-05 NOTE — Procedures (Signed)
 Received patient in bed to unit.  Alert and oriented.  Informed consent signed and in chart.   TX duration: 3 hours  Patient tolerated well.  Transported back to the room  Alert, without acute distress.  Hand-off given to patient's nurse.   Access used: left arm fistula Access issues: none  Total UF removed: 2.5 liters Medication(s) given: none   Greig Silvan, RN [facility]

## 2023-10-05 NOTE — Progress Notes (Addendum)
 [facility] Progress Note  Subjective:  Seen in HD unit, Ox 3  Vitals:   10/05/23 1100 10/05/23 1130 10/05/23 1203 10/05/23 1209  BP: 122/66 (!) 110/55 (!) 107/51 (!) 128/58  Pulse: 72 71 72 73  Resp: 15 10 15 13   Temp:    97.9 F (36.6 C)  TempSrc:      SpO2: 100% 100% 100% 100%  Weight:      Height:        Exam: Gen alert, no distress, nasal cannula O2 at 2 L No jvd or bruits Chest clear bilat to bases RRR no MRG Abd soft ntnd no mass or ascites +bs Ext 1+ pretibial edema bilat, no other edema Neuro is alert, Ox 3 , nf, no asterixis    AVF + bruit      Home bp meds: Norvasc  5 every day Coreg  6.25 mg bid , hold am dose mwf  CXR port 7/08 --> no sig edema or vasc congestion   OP HD: [facility]  MWF From July 2023 --> 4h  97.5kg   B450   2K  AVF  Heparin  none Getting records      Assessment/ Plan: AMS: CT/ MIR negative. Possibly medication related. Ox 3 now. Per pmd.  ESRD: on HD MWF. Last OP HD Monday. HD today off schedule. Probable dc today per pmd. If dc'd she should go to her HD session tomorrow at [facility].  HTN: BP's low-normal, agree w/ holding home BP meds. Resume prn.  Volume: mild LE edema, no resp issues. CXR no edema. 2.5 L UF w/ HD today.  Anemia of esrd: Hb 9.8- 12 range, follow Class III obesity OSA     Myer Fret MD  [facility] 10/05/2023, 12:13 PM  Recent Labs  Lab 10/03/23 1433 10/03/23 1536 10/04/23 0356 10/05/23 0912  HGB 9.8* 12.6  11.6* 9.5* 8.9*  ALBUMIN  3.2*  --   --   --   CALCIUM  9.4  --  9.0  --   PHOS  --   --  6.4*  --   CREATININE 5.42* 5.70* 5.92*  --   K 5.3* 4.9  4.9 4.6  --    No results for input(s): IRON, TIBC, FERRITIN in the last 168 hours. Inpatient medications:  Chlorhexidine  Gluconate Cloth  6 each Topical Q0600   heparin   5,000 Units Subcutaneous Q8H   insulin  aspart  0-6 Units Subcutaneous TID WC   sodium chloride  flush  3 mL Intravenous Q12H    anticoagulant sodium citrate       acetaminophen , albuterol , alteplase , anticoagulant sodium citrate , feeding supplement (NEPRO CARB STEADY), heparin , lidocaine  (PF), lidocaine -prilocaine , melatonin, ondansetron  (ZOFRAN ) IV, pentafluoroprop-tetrafluoroeth, polyethylene glycol

## 2023-10-05 NOTE — Progress Notes (Signed)
 Per Taneya's request, call placed to her brother Burnetta Parker/[phone] to let him know that she is going to hemodialysis this morning.

## 2023-10-06 ENCOUNTER — Telehealth: Payer: Self-pay

## 2023-10-06 NOTE — Transitions of Care (Post Inpatient/ED Visit) (Signed)
   10/06/2023  Name: Janet Mitchell MRN: [id number] DOB: 08-19-1965  Today's TOC FU Call Status: Today's TOC FU Call Status:: Unsuccessful Call (1st Attempt) Unsuccessful Call (1st Attempt) Date: 10/06/23  Attempted to reach the patient regarding the most recent Inpatient/ED visit.  Follow Up Plan: Additional outreach attempts will be made to reach the patient to complete the Transitions of Care (Post Inpatient/ED visit) call.   Alan Ee, RN, BSN, CEN [employer]- Transition of Care Team.  [facility] [phone]

## 2023-10-07 LAB — DRUG SCREEN 10 W/CONF, SERUM
Amphetamines, IA: NEGATIVE ng/mL
Barbiturates, IA: NEGATIVE ug/mL
Benzodiazepines, IA: NEGATIVE ng/mL
Cocaine & Metabolite, IA: NEGATIVE ng/mL
Methadone, IA: NEGATIVE ng/mL
Opiates, IA: NEGATIVE ng/mL
Oxycodones, IA: NEGATIVE ng/mL
Phencyclidine, IA: NEGATIVE ng/mL
Propoxyphene, IA: NEGATIVE ng/mL
THC(Marijuana) Metabolite, IA: NEGATIVE ng/mL

## 2023-10-08 LAB — CULTURE, BLOOD (ROUTINE X 2)
Culture: NO GROWTH
Culture: NO GROWTH
Special Requests: ADEQUATE

## 2023-10-09 ENCOUNTER — Telehealth: Payer: Self-pay | Admitting: *Deleted

## 2023-10-09 NOTE — Transitions of Care (Post Inpatient/ED Visit) (Signed)
 10/09/2023  Name: Janet Mitchell MRN: [id number] DOB: [birth year]  Today's TOC FU Call Status: Today's TOC FU Call Status:: Successful TOC FU Call Completed TOC FU Call Complete Date: 10/09/23 Patient's Name and Date of Birth confirmed.  Transition Care Management Follow-up Telephone Call Date of Discharge: 10/05/23 Discharge Facility: [facility]) Type of Discharge: Inpatient Admission Primary Inpatient Discharge Diagnosis:: Encephalopathy How have you been since you were released from the hospital?: Better Any questions or concerns?: No  Items Reviewed: Did you receive and understand the discharge instructions provided?: Yes Medications obtained,verified, and reconciled?: Yes (Medications Reviewed) Any new allergies since your discharge?: No Dietary orders reviewed?: Yes Type of Diet Ordered:: Heart Healthy, carb modified Do you have support at home?: Yes People in Home [RPT]: spouse Name of Support/Comfort Primary Source: Spouse/[name]  Medications Reviewed Today: Medications Reviewed Today     Reviewed by Lucky Andrea LABOR, RN (Registered Nurse) on 10/09/23 at 1501  Med List Status: <None>   Medication Order Taking? Sig Documenting Provider Last Dose Status Informant  albuterol  (VENTOLIN  HFA) 108 (90 Base) MCG/ACT inhaler [id number] Yes Inhale 2 puffs into the lungs every 6 (six) hours as needed for wheezing or shortness of breath. Tobie Suzzane POUR, MD  Active Spouse/Significant Other, Pharmacy Records  amLODipine  (NORVASC ) 5 MG tablet [id number] Yes Take 5 mg by mouth every evening. [provider]  Active Spouse/Significant Other, Pharmacy Records  calcitRIOL  (ROCALTROL ) 0.5 MCG capsule [id number] Yes Take 1 capsule (0.5 mcg total) by mouth every Monday, Wednesday, and Friday. Tobie Suzzane POUR, MD  Active Spouse/Significant Other, Pharmacy Records  carvedilol  (COREG ) 6.25 MG tablet [id number] Yes TAKE ONE TABLET BY MOUTH 2 TIMES A DAY WITH A MEAL Tobie Suzzane POUR, MD  Active Spouse/Significant Other, Pharmacy Records  cetirizine  (ZYRTEC ) 10 MG tablet [id number] Yes TAKE ONE TABLET BY MOUTH EVERY DAY Tobie Suzzane POUR, MD  Active Spouse/Significant Other, Pharmacy Records  famotidine  (PEPCID ) 20 MG tablet [id number] Yes One after supper Darlean Ozell NOVAK, MD  Active Spouse/Significant Other, Pharmacy Records  glipiZIDE  (GLUCOTROL  XL) 5 MG 24 hr tablet [id number] Yes Take 1 tablet (5 mg total) by mouth daily with breakfast. Tobie Suzzane POUR, MD  Active Spouse/Significant Other, Pharmacy Records  multivitamin (RENA-VIT) TABS tablet [id number] Yes TAKE ONE TABLET BY MOUTH EVERY DAY Patel, Rutwik K, MD  Active Spouse/Significant Other, Pharmacy Records           Med Note (Keiffer Piper A   Mon Oct 09, 2023  3:00 PM)    pantoprazole  (PROTONIX ) 40 MG tablet [id number] Yes Take 1 tablet (40 mg total) by mouth daily.  Patient taking differently: Take 40 mg by mouth daily as needed (for acid reflux).   Cindie Carlin POUR, DO  Active Spouse/Significant Other, Pharmacy Records  sevelamer  carbonate (RENVELA ) 800 MG tablet [id number] Yes 1,600 mg 3 (three) times daily with meals. [provider]  Active Spouse/Significant Other, Pharmacy Records  traMADol (ULTRAM) 50 MG tablet [id number]  Take 50 mg by mouth daily as needed for moderate pain (pain score 4-6) or severe pain (pain score 7-10).  Patient not taking: Reported on 10/09/2023   [provider]  Active Spouse/Significant Other, Pharmacy Records  traZODone  (DESYREL ) 50 MG tablet [id number] Yes TAKE 1/2 TO 1 TABLET BY MOUTH AT BEDTIME AS NEEDED FOR SLEEP Tobie Suzzane POUR, MD  Active Spouse/Significant Other, Pharmacy Records  Med List Note (Cruthis, [place], CPhT 10/04/23 1351): Husband handles medications.  Home Care and Equipment/Supplies: Were Home Health Services Ordered?: Yes Name of Home Health Agency:: [facility] Has Agency set up a time to come to your home?: No (Patient is  unsure and will check with her spouse) EMR reviewed for Home Health Orders: Orders present/patient has not received call (refer to CM for follow-up) (RNCM placed call to [facility], spoke with Channing, confirmed PCP. Channing has the order and will call patient to schedule first visit.) Any new equipment or medical supplies ordered?: No  Functional Questionnaire: Do you need assistance with bathing/showering or dressing?: Yes Do you need assistance with meal preparation?: Yes Do you need assistance with eating?: No Do you have difficulty maintaining continence: Yes Do you need assistance with getting out of bed/getting out of a chair/moving?: Yes Do you have difficulty managing or taking your medications?: Yes  Follow up appointments reviewed: PCP Follow-up appointment confirmed?: No (Patient will have her husband call and schedule an appointment) MD Provider Line [phone] Given: No Specialist Hospital Follow-up appointment confirmed?: NA Do you need transportation to your follow-up appointment?: No Do you understand care options if your condition(s) worsen?: Yes-patient verbalized understanding  SDOH Interventions Today    Flowsheet Row Most Recent Value  SDOH Interventions   Food Insecurity Interventions Intervention Not Indicated  Housing Interventions Intervention Not Indicated  Transportation Interventions Intervention Not Indicated  Utilities Interventions Intervention Not Indicated    Andrea Dimes RN, BSN [place]  [facility] RN Care Manager [phone]

## 2023-10-10 ENCOUNTER — Ambulatory Visit: Admitting: Internal Medicine

## 2023-10-12 ENCOUNTER — Ambulatory Visit (INDEPENDENT_AMBULATORY_CARE_PROVIDER_SITE_OTHER)

## 2023-10-12 VITALS — BP 132/75 | HR 70 | Ht 68.0 in | Wt 270.0 lb

## 2023-10-12 DIAGNOSIS — G9340 Encephalopathy, unspecified: Secondary | ICD-10-CM | POA: Diagnosis not present

## 2023-10-12 NOTE — Assessment & Plan Note (Signed)
 Recent hospital records were reviewed.  Patient and husband deny any concerns or issues since she has been home.  We will recheck CMP and CBC as requested by hospital physician.  She denies needing refills at this time.

## 2023-10-12 NOTE — Progress Notes (Signed)
 Established Patient Office Visit  Subjective   Patient ID: Janet Mitchell, female    DOB: 03-27-1966  Age: 58 y.o. MRN: [id number]  Chief Complaint  Patient presents with   Hospitalization Follow-up    Hospital folow up    HPI  Pt was [facility] to [facility] on 10/[facility]/23 and discharged on 10/05/23 for the following. Workup revealed mild lactic acidosis, hyperkalemia normal pneumonia, LFTs, TSH B12,WBC count 10,hemoglobin 9.8 Tylenol  salicylates at alcohol  negative.  Neurology was consulted and patient underwent further imaging Patient had CT head chest x-ray and MRI brain> no acute findings Seen by neurology underwent EEG w/o acute findings. SuspectED toxic/metabolic etiology of event. Specifically given distractibility of the arm shaking reported by husband very low concern that this represents seizure. Inpatient neurology signed off at this time patient is alert awake oriented back to baseline and stable for discharge  She is here today with her husband for hospital follow-up   Patient Active Problem List   Diagnosis Date Noted   Encephalopathy 10/04/2023   Multiple pulmonary nodules determined by computed tomography of lung 08/10/2023   Mixed stress and urge urinary incontinence 02/21/2023   Acute non-recurrent maxillary sinusitis 01/11/2023   Allergic sinusitis 11/15/2022   Encounter for general adult medical examination with abnormal findings 11/15/2022   OSA (obstructive sleep apnea) 10/04/2022   Vitreous hemorrhage, right eye (HCC) 10/04/2022   Acute bronchitis 06/02/2022   Primary insomnia 06/02/2022   Wheelchair dependent 05/31/2022   Left hemiplegia (HCC) 01/27/2022   Thrombocytopenia (HCC) 10/04/2021   History of tracheostomy 09/22/2021   Dysphagia 09/22/2021   Intracranial hemorrhage (HCC) 08/23/2021   Proliferative diabetic retinopathy of right eye determined by examination (HCC) 06/29/2021   Stable treated proliferative diabetic retinopathy of left eye  determined by examination associated with type 2 diabetes mellitus (HCC) 06/29/2021   Hyponatremia    ESRD (end stage renal disease) on dialysis [facility])    Essential hypertension 02/14/2019   Obesity, morbid, BMI 50 or higher (HCC) 02/14/2019   Abnormal nuclear stress test 11/02/2018   Rectal bleeding 08/27/2018   Diarrhea 05/22/2018   Anemia 03/25/2018   Chronic cholecystitis with calculus 03/09/2018   Type 2 diabetes mellitus with other specified complication (HCC) 03/09/2018   Pre-transplant evaluation for ESRD (end stage renal disease) 12/19/2017   Dependence on renal dialysis (HCC) 11/06/2017   Diabetic retinopathy (HCC) 01/23/2016   End stage renal disease on dialysis due to type 2 diabetes mellitus (HCC) 01/23/2016   Renal osteodystrophy 01/23/2016   Edema of lower extremity 04/10/2015   Paresthesia of lower extremity 04/10/2015   Recurrent falls 04/10/2015      ROS    Objective:     BP 132/75   Pulse 70   Ht 5' 8 (1.727 m)   Wt 270 lb (122.5 kg)   SpO2 90%   BMI 41.05 kg/m  BP Readings from Last 3 Encounters:  10/12/23 132/75  10/05/23 117/77  09/26/23 (!) 144/74   Wt Readings from Last 3 Encounters:  10/12/23 270 lb (122.5 kg)  10/05/23 280 lb 3.2 oz (127.1 kg)  08/10/23 264 lb 6.4 oz (119.9 kg)      Physical Exam Constitutional:      General: She is not in acute distress.    Appearance: She is obese. She is not diaphoretic.     Comments: In wheelchair  HENT:     Head: Normocephalic.     Nose: No congestion.     Mouth/Throat:  Mouth: Mucous membranes are moist.     Pharynx: No posterior oropharyngeal erythema.  Eyes:     Conjunctiva/sclera: Conjunctivae normal.     Pupils: Pupils are equal, round, and reactive to light.  Cardiovascular:     Rate and Rhythm: Normal rate and regular rhythm.     Heart sounds: Normal heart sounds. No murmur heard. Pulmonary:     Effort: Pulmonary effort is normal.     Breath sounds: Normal breath sounds.   Musculoskeletal:     Cervical back: Neck supple. No tenderness.     Right lower leg: No edema.     Left lower leg: No edema.  Skin:    General: Skin is warm.     Findings: No rash.     Comments: Left UE AV fistula  Neurological:     Mental Status: She is alert and oriented to person, place, and time.     Sensory: Sensory deficit (Left UE and LE) present.     Motor: Weakness (Left UE and LE - 1/5, right UE and LE - 4/5) present.  Psychiatric:        Mood and Affect: Mood normal.        Behavior: Behavior normal.      No results found for any visits on 10/12/23.  Last CBC Lab Results  Component Value Date   WBC 6.4 10/05/2023   HGB 8.9 (L) 10/05/2023   HCT 28.4 (L) 10/05/2023   MCV 82.6 10/05/2023   MCH 25.9 (L) 10/05/2023   RDW 15.3 10/05/2023   PLT 150 10/05/2023   Last metabolic panel Lab Results  Component Value Date   GLUCOSE 83 10/05/2023   NA 134 (L) 10/05/2023   K 5.0 10/05/2023   CL 96 (L) 10/05/2023   CO2 24 10/05/2023   BUN 61 (H) 10/05/2023   CREATININE 7.89 (H) 10/05/2023   GFRNONAA 6 (L) 10/05/2023   CALCIUM  8.9 10/05/2023   PHOS 6.9 (H) 10/05/2023   PROT 7.2 10/03/2023   ALBUMIN  3.1 (L) 10/05/2023   BILITOT 0.3 10/03/2023   ALKPHOS 74 10/03/2023   AST 16 10/03/2023   ALT 14 10/03/2023   ANIONGAP 14 10/05/2023      The 10-year ASCVD risk score (Arnett DK, et al., 2019) is: 10%    Assessment & Plan:   Problem List Items Addressed This Visit       Nervous and Auditory   Encephalopathy - Primary   Recent hospital records were reviewed.  Patient and husband deny any concerns or issues since she has been home.  We will recheck CMP and CBC as requested by hospital physician.  She denies needing refills at this time.      Relevant Orders   CMP14+EGFR   CBC with Differential/Platelet    No follow-ups on file.    Leita Longs, FNP

## 2023-10-13 LAB — CMP14+EGFR
ALT: 9 IU/L (ref 0–32)
AST: 10 IU/L (ref 0–40)
Albumin: 3.8 g/dL (ref 3.8–4.9)
Alkaline Phosphatase: 95 IU/L (ref 44–121)
BUN/Creatinine Ratio: 6 — ABNORMAL LOW (ref 9–23)
BUN: 28 mg/dL — ABNORMAL HIGH (ref 6–24)
Bilirubin Total: 0.2 mg/dL (ref 0.0–1.2)
CO2: 26 mmol/L (ref 20–29)
Calcium: 9.6 mg/dL (ref 8.7–10.2)
Chloride: 95 mmol/L — ABNORMAL LOW (ref 96–106)
Creatinine, Ser: 4.66 mg/dL — ABNORMAL HIGH (ref 0.57–1.00)
Globulin, Total: 3 g/dL (ref 1.5–4.5)
Glucose: 142 mg/dL — ABNORMAL HIGH (ref 70–99)
Potassium: 4.2 mmol/L (ref 3.5–5.2)
Sodium: 138 mmol/L (ref 134–144)
Total Protein: 6.8 g/dL (ref 6.0–8.5)
eGFR: 10 mL/min/1.73 — ABNORMAL LOW (ref >59)

## 2023-10-13 LAB — CBC WITH DIFFERENTIAL/PLATELET
Basophils Absolute: 0.1 x10E3/uL (ref 0.0–0.2)
Basos: 1 %
EOS (ABSOLUTE): 0.2 x10E3/uL (ref 0.0–0.4)
Eos: 3 %
Hematocrit: 32.7 % — ABNORMAL LOW (ref 34.0–46.6)
Hemoglobin: 9.9 g/dL — ABNORMAL LOW (ref 11.1–15.9)
Immature Grans (Abs): 0.1 x10E3/uL (ref 0.0–0.1)
Immature Granulocytes: 2 %
Lymphocytes Absolute: 1.7 x10E3/uL (ref 0.7–3.1)
Lymphs: 27 %
MCH: 25.9 pg — ABNORMAL LOW (ref 26.6–33.0)
MCHC: 30.3 g/dL — ABNORMAL LOW (ref 31.5–35.7)
MCV: 86 fL (ref 79–97)
Monocytes Absolute: 0.7 x10E3/uL (ref 0.1–0.9)
Monocytes: 11 %
Neutrophils Absolute: 3.4 x10E3/uL (ref 1.4–7.0)
Neutrophils: 56 %
Platelets: 152 x10E3/uL (ref 150–450)
RBC: 3.82 x10E6/uL (ref 3.77–5.28)
RDW: 16.5 % — ABNORMAL HIGH (ref 11.7–15.4)
WBC: 6.1 x10E3/uL (ref 3.4–10.8)

## 2023-10-23 ENCOUNTER — Encounter: Payer: Self-pay | Admitting: Surgery

## 2023-11-02 ENCOUNTER — Other Ambulatory Visit: Payer: Self-pay | Admitting: Internal Medicine

## 2023-11-02 ENCOUNTER — Telehealth: Payer: Self-pay | Admitting: Internal Medicine

## 2023-11-02 NOTE — Telephone Encounter (Signed)
 Copied from CRM #[id number]. Topic: Clinical - Prescription Issue >> Nov 02, 2023  2:13 PM Turkey B wrote: Reason for CRM: robin from [place] they dont have the 0.8 of multivitamin (RENA-VIT) TABS but do have the 1mg  and wants to know can they go with this what they have

## 2023-11-03 ENCOUNTER — Telehealth: Payer: Self-pay | Admitting: Internal Medicine

## 2023-11-03 NOTE — Telephone Encounter (Signed)
 Copied from CRM #[id number]. Topic: Clinical - Prescription Issue >> Nov 03, 2023 11:50 AM Tiffini S wrote: Reason for CRM: Hoy with [facility] [phone] called requesting to the  multivitamin (RENA-VIT) TABS 0.8mg  tablet changed to  the multivitamin (RENA-VIT) TABS RX

## 2023-11-06 ENCOUNTER — Telehealth: Payer: Self-pay

## 2023-11-06 NOTE — Telephone Encounter (Signed)
 Has been addressed in other TE.

## 2023-11-06 NOTE — Telephone Encounter (Signed)
 Spoke to pharmacy to approve change please refer to other TE

## 2023-11-06 NOTE — Telephone Encounter (Signed)
 Copied from CRM #[id number]. Topic: Clinical - Prescription Issue >> Nov 03, 2023 11:50 AM Tiffini S wrote: Reason for CRM: Meredith with [facility] [phone] called requesting to the  multivitamin (RENA-VIT) TABS 0.8mg  tablet changed to  the multivitamin (RENA-VIT) TABS RX >> Nov 06, 2023  9:43 AM Franky GRADE wrote: Hoy with [facility] is calling to follow up on this request as they have not received the new prescription. Change multivitamin (RENA-VIT) TABS 0.8mg  tablet changed to  the multivitamin (RENA-VIT) TABS RX as they are unable to get the  (RENA-VIT) TABS 0.8mg 

## 2023-11-13 LAB — HM DIABETES EYE EXAM

## 2023-11-22 ENCOUNTER — Telehealth: Payer: Self-pay | Admitting: Internal Medicine

## 2023-11-22 NOTE — Telephone Encounter (Signed)
 Office notes, and lab notes faxed.

## 2023-11-22 NOTE — Telephone Encounter (Signed)
 Copied from CRM #[id number]. Topic: Medical Record Request - Provider/Facility Request >> Nov 22, 2023  1:56 PM Tobias CROME wrote: Reason for CRM: Deane with [facility] requesting patient's latest history and physical. Patient seen this morning by [facility].   Fax number: [phone]

## 2023-11-23 ENCOUNTER — Ambulatory Visit (INDEPENDENT_AMBULATORY_CARE_PROVIDER_SITE_OTHER): Admitting: Internal Medicine

## 2023-11-23 ENCOUNTER — Encounter: Payer: Self-pay | Admitting: Internal Medicine

## 2023-11-23 ENCOUNTER — Ambulatory Visit: Admitting: Internal Medicine

## 2023-11-23 VITALS — BP 152/95 | HR 69

## 2023-11-23 DIAGNOSIS — I10 Essential (primary) hypertension: Secondary | ICD-10-CM

## 2023-11-23 DIAGNOSIS — E1169 Type 2 diabetes mellitus with other specified complication: Secondary | ICD-10-CM

## 2023-11-23 DIAGNOSIS — F5101 Primary insomnia: Secondary | ICD-10-CM | POA: Diagnosis not present

## 2023-11-23 DIAGNOSIS — Z0001 Encounter for general adult medical examination with abnormal findings: Secondary | ICD-10-CM

## 2023-11-23 DIAGNOSIS — G4733 Obstructive sleep apnea (adult) (pediatric): Secondary | ICD-10-CM | POA: Diagnosis not present

## 2023-11-23 DIAGNOSIS — Z794 Long term (current) use of insulin: Secondary | ICD-10-CM

## 2023-11-23 DIAGNOSIS — R918 Other nonspecific abnormal finding of lung field: Secondary | ICD-10-CM

## 2023-11-23 DIAGNOSIS — E113553 Type 2 diabetes mellitus with stable proliferative diabetic retinopathy, bilateral: Secondary | ICD-10-CM

## 2023-11-23 MED ORDER — TRAZODONE HCL 50 MG PO TABS: 25.0000 mg | ORAL_TABLET | Freq: Every evening | ORAL | 3 refills | Status: DC | PRN

## 2023-11-23 NOTE — Progress Notes (Unsigned)
 Established Patient Office Visit  Subjective:  Patient ID: Janet Mitchell, female    DOB: 12/14/1965  Age: 58 y.o. MRN: [id number]  CC:  Chief Complaint  Patient presents with   Care Management    Follow up    HPI Janet Mitchell is a 58 y.o. female with past medical history of HTN, DM with ESRD on HD and intracranial hemorrhage who presents for f/u of her chronic medical conditions.  She still complains of cough with clear expectoration and chest congestion for the last 2 months.  She was given azithromycin  in the past for acute bronchitis.  She has tried Robitussin with mild relief.  Her husband does not report any episode of fever.  She has chronic cold sensation.  Her recent x-ray of the chest showed worsening micronodular pattern of lungs.  She is planned to get high-resolution CT of chest and see pulmonology for further evaluation.  She is currently on HD for ESRD.  Her BP is well controlled today.  She takes amlodipine  5 mg QPM and Coreg  6.25 mg twice daily, but avoids morning dose of carvedilol  on HD days.  She denies hypotensive spells during HD now.  Denies any chest pain, dyspnea or palpitations currently.  Type II DM: Her blood glucose has been well controlled with Glipizide  now. Her last HbA1c was 7.6 in 10/24.  Denies any episode of hypoglycemia recently.  She has mild improvement in sleep with trazodone . She is mostly wheelchair dependent and is likely that she might be taking daytime naps.  She had intracranial hemorrhage in 05/23.  She was admitted at [facility] for right-sided thalamic hemorrhage, for which she had external ventricular shunt placement by neurosurgery.  She had trach and PEG tube placed, and was discharged to SNF on 10/05/21. She has residual left-sided hemiplegia and is wheelchair-bound currently.  She had home PT and her LUE and LLE weakness have improved slightly.  She is mostly bedbound and wheelchair-bound  currently.  She had vitreous hemorrhage of the right eye and had vitrectomy on 10/06/2022.  Her eye pain has improved now.    Past Medical History:  Diagnosis Date   Abdominal wall hematoma 12/26/2019   Anemia    Arthritis    CHF (congestive heart failure) (HCC)    pt states she has been cleared of heart failure/disease   Chronic cholecystitis with calculus    COVID-19 03/26/2019   COVID-19 virus infection 03/2019   ESRD (end stage renal disease) on dialysis [facility])    Dialysis M-W-F   Headache    a few/wk (03/09/2018) - no longer having these   History of blood transfusion 10/2017   low blood count (03/09/2018)   Hypertension    OSA (obstructive sleep apnea) 10/04/2022   Respiratory failure requiring intubation (HCC)    Seasonal allergies    Spinal headache    Status post insertion of percutaneous endoscopic gastrostomy (PEG) tube (HCC) 01/26/2022   Type II diabetes mellitus (HCC)     Past Surgical History:  Procedure Laterality Date   A/V SHUNT INTERVENTION Right 09/26/2023   Procedure: A/V SHUNT INTERVENTION;  Surgeon: Lanis Fonda BRAVO, MD;  Location: [facility];  Service: Cardiovascular;  Laterality: Right;   AMPUTATION TOE Left 2013   Great toe   AV FISTULA PLACEMENT Left 07/22/2019   Procedure: LEFT ARM BASILIC ARTERIOVENOUS (AV) FISTULA CREATION;  Surgeon: Oris Krystal FALCON, MD;  Location: [facility] OR;  Service: Vascular;  Laterality: Left;   BASCILIC VEIN  TRANSPOSITION Left 10/17/2019   Procedure: LEFT SECOND STAGE BASCILIC VEIN TRANSPOSITION;  Surgeon: Oris Krystal FALCON, MD;  Location: [facility] OR;  Service: Vascular;  Laterality: Left;   BIOPSY  10/24/2018   Procedure: BIOPSY;  Surgeon: Shaaron Lamar HERO, MD;  Location: [facility];  Service: Endoscopy;;  right and left colon   CATARACT EXTRACTION W/ INTRAOCULAR LENS IMPLANT Right    CESAREAN SECTION  1994; 1998   CHOLECYSTECTOMY N/A 03/09/2018   Procedure: LAPAROSCOPIC CHOLECYSTECTOMY WITH INTRAOPERATIVE CHOLANGIOGRAM ERAS  PATHWAY;  Surgeon: Belinda Cough, MD;  Location: [facility] OR;  Service: General;  Laterality: N/A;   COLONOSCOPY N/A 10/24/2018   Surgeon: Shaaron Lamar HERO, MD; 15 mm adenomatous polyp in the transverse colon removed, segmental biopsies benign.   EYE SURGERY Left 05/15/2018   Removal of blood in the globe (due to DM)   FLEXIBLE SIGMOIDOSCOPY N/A 11/24/2017   Procedure: FLEXIBLE SIGMOIDOSCOPY;  Surgeon: Shaaron Lamar HERO, MD;  Location: [facility];  Service: Endoscopy;  Laterality: N/A;   IR FLUORO GUIDE CV LINE RIGHT  07/17/2019   IR GASTROSTOMY TUBE MOD SED  09/16/2021   IR PARACENTESIS  07/09/2019   IR US  GUIDE VASC ACCESS RIGHT  07/17/2019   LAPAROSCOPIC CHOLECYSTECTOMY  03/09/2018   LAPAROSCOPIC REVISION VENTRICULAR-PERITONEAL (V-P) SHUNT  09/08/2021   Procedure: LAPAROSCOPIC ASSISTANCE FOR  VENTRICULAR-PERITONEAL (V-P) SHUNT PLACEMENT ;  Surgeon: Debby Dorn MATSU, MD;  Location: [facility] OR;  Service: Neurosurgery;;   PERITONEAL CATHETER INSERTION  2017   POLYPECTOMY  10/24/2018   Procedure: POLYPECTOMY;  Surgeon: Shaaron Lamar HERO, MD;  Location: [facility];  Service: Endoscopy;;   TOTAL HIP ARTHROPLASTY Left 1997   VENOUS ANGIOPLASTY  09/26/2023   Procedure: VENOUS ANGIOPLASTY;  Surgeon: Lanis Fonda BRAVO, MD;  Location: [facility];  Service: Cardiovascular;;   VENTRICULOPERITONEAL SHUNT N/A 09/08/2021   Procedure: SHUNT INSERTION VENTRICULAR-PERITONEAL;  Surgeon: Debby Dorn MATSU, MD;  Location: [facility] OR;  Service: Neurosurgery;  Laterality: N/A;    Family History  Problem Relation Age of Onset   Heart disease Mother    Thrombocytopenia Mother        TTP   Heart failure Father    Kidney disease Paternal Grandfather    Colon cancer Neg Hx     Social History   Socioeconomic History   Marital status: Married    Spouse name: Not on file   Number of children: 2   Years of education: Not on file   Highest education level: Not on file  Occupational History   Not on file  Tobacco  Use   Smoking status: Never    Passive exposure: Yes   Smokeless tobacco: Never  Vaping Use   Vaping status: Never Used  Substance and Sexual Activity   Alcohol  use: Never   Drug use: Never   Sexual activity: Yes    Birth control/protection: I.U.D.    Comment: Mirena  IUD  Other Topics Concern   Not on file  Social History Narrative   Not on file   Social Drivers of Health   Financial Resource Strain: Low Risk  (12/20/2022)   Overall Financial Resource Strain (CARDIA)    Difficulty of Paying Living Expenses: Not hard at all  Food Insecurity: No Food Insecurity (10/09/2023)   Hunger Vital Sign    Worried About Running Out of Food in the Last Year: Never true    Ran Out of Food in the Last Year: Never true  Transportation Needs: No Transportation Needs (10/09/2023)  PRAPARE - Administrator, Civil Service (Medical): No    Lack of Transportation (Non-Medical): No  Physical Activity: Inactive (12/20/2022)   Exercise Vital Sign    Days of Exercise per Week: 0 days    Minutes of Exercise per Session: 0 min  Stress: No Stress Concern Present (12/20/2022)   [employer] of Occupational Health - Occupational Stress Questionnaire    Feeling of Stress : Only a little  Social Connections: Moderately Integrated (12/20/2022)   Social Connection and Isolation Panel    Frequency of Communication with Friends and Family: More than three times a week    Frequency of Social Gatherings with Friends and Family: More than three times a week    Attends Religious Services: More than 4 times per year    Active Member of [employer] or Organizations: No    Attends Banker Meetings: Never    Marital Status: Married  Catering manager Violence: Not At Risk (10/09/2023)   Humiliation, Afraid, Rape, and Kick questionnaire    Fear of Current or Ex-Partner: No    Emotionally Abused: No    Physically Abused: No    Sexually Abused: No    Outpatient Medications Prior to Visit   Medication Sig Dispense Refill   albuterol  (VENTOLIN  HFA) 108 (90 Base) MCG/ACT inhaler Inhale 2 puffs into the lungs every 6 (six) hours as needed for wheezing or shortness of breath. 8 g 2   amLODipine  (NORVASC ) 5 MG tablet Take 5 mg by mouth every evening.     calcitRIOL  (ROCALTROL ) 0.5 MCG capsule Take 1 capsule (0.5 mcg total) by mouth every Monday, Wednesday, and Friday. 30 capsule 0   carvedilol  (COREG ) 6.25 MG tablet TAKE ONE TABLET BY MOUTH 2 TIMES A DAY WITH A MEAL 60 tablet 2   cetirizine  (ZYRTEC ) 10 MG tablet TAKE ONE TABLET BY MOUTH EVERY DAY 30 tablet 0   famotidine  (PEPCID ) 20 MG tablet One after supper 30 tablet 11   glipiZIDE  (GLUCOTROL  XL) 5 MG 24 hr tablet Take 1 tablet (5 mg total) by mouth daily with breakfast. 100 tablet 3   multivitamin (RENA-VIT) TABS tablet TAKE ONE TABLET BY MOUTH EVERY DAY 30 tablet 0   pantoprazole  (PROTONIX ) 40 MG tablet Take 1 tablet (40 mg total) by mouth daily. (Patient taking differently: Take 40 mg by mouth daily as needed (for acid reflux).) 90 tablet 3   sevelamer  carbonate (RENVELA ) 800 MG tablet 1,600 mg 3 (three) times daily with meals.     traMADol (ULTRAM) 50 MG tablet Take 50 mg by mouth daily as needed for moderate pain (pain score 4-6) or severe pain (pain score 7-10).     traZODone  (DESYREL ) 50 MG tablet TAKE 1/2 TO 1 TABLET BY MOUTH AT BEDTIME AS NEEDED FOR SLEEP 30 tablet 3   No facility-administered medications prior to visit.    No Known Allergies  ROS Review of Systems  Constitutional:  Negative for chills and fever.  HENT:  Negative for congestion, sinus pressure and sinus pain.   Eyes:  Positive for visual disturbance. Negative for discharge.  Respiratory:  Positive for cough and shortness of breath.   Cardiovascular:  Negative for chest pain and palpitations.  Gastrointestinal:  Negative for diarrhea, nausea and vomiting.  Genitourinary:  Negative for dysuria and hematuria.  Musculoskeletal:  Negative for neck pain  and neck stiffness.  Skin:  Negative for rash.  Neurological:  Positive for weakness and numbness.  Psychiatric/Behavioral:  Positive for sleep disturbance.  Negative for agitation and behavioral problems. The patient is nervous/anxious.       Objective:    Physical Exam Constitutional:      General: She is not in acute distress.    Appearance: She is obese. She is not diaphoretic.     Comments: In wheelchair  HENT:     Head: Normocephalic.     Nose: No congestion.     Mouth/Throat:     Mouth: Mucous membranes are moist.     Pharynx: No posterior oropharyngeal erythema.  Eyes:     General: No scleral icterus.    Conjunctiva/sclera:     Right eye: Right conjunctiva is injected.     Pupils: Pupils are equal, round, and reactive to light.  Cardiovascular:     Rate and Rhythm: Normal rate and regular rhythm.     Heart sounds: Normal heart sounds. No murmur heard. Pulmonary:     Breath sounds: No wheezing or rales.  Musculoskeletal:     Cervical back: Neck supple. No tenderness.     Right lower leg: No edema.     Left lower leg: No edema.  Skin:    General: Skin is warm.     Findings: No rash.     Comments: Left UE AV fistula  Neurological:     Mental Status: She is alert and oriented to person, place, and time.     Sensory: Sensory deficit (Left UE and LE) present.     Motor: Weakness (Left UE and LE - 1/5, right UE and LE - 4/5) present.  Psychiatric:        Mood and Affect: Mood normal.        Behavior: Behavior normal.     BP (!) 152/95   Pulse 69   SpO2 94%  Wt Readings from Last 3 Encounters:  10/12/23 270 lb (122.5 kg)  10/05/23 280 lb 3.2 oz (127.1 kg)  08/10/23 264 lb 6.4 oz (119.9 kg)    Lab Results  Component Value Date   TSH 2.206 10/04/2023   Lab Results  Component Value Date   WBC 6.1 10/12/2023   HGB 9.9 (L) 10/12/2023   HCT 32.7 (L) 10/12/2023   MCV 86 10/12/2023   PLT 152 10/12/2023   Lab Results  Component Value Date   NA 138  10/12/2023   K 4.2 10/12/2023   CO2 26 10/12/2023   GLUCOSE 142 (H) 10/12/2023   BUN 28 (H) 10/12/2023   CREATININE 4.66 (H) 10/12/2023   BILITOT 0.2 10/12/2023   ALKPHOS 95 10/12/2023   AST 10 10/12/2023   ALT 9 10/12/2023   PROT 6.8 10/12/2023   ALBUMIN  3.8 10/12/2023   CALCIUM  9.6 10/12/2023   ANIONGAP 14 10/05/2023   EGFR 10 (L) 10/12/2023   Lab Results  Component Value Date   CHOL 178 08/23/2021   Lab Results  Component Value Date   HDL 63 08/23/2021   Lab Results  Component Value Date   LDLCALC 38 08/23/2021   Lab Results  Component Value Date   TRIG 174 (H) 08/28/2021   Lab Results  Component Value Date   CHOLHDL 2.8 08/23/2021   Lab Results  Component Value Date   HGBA1C 5.8 (H) 08/10/2023      Assessment & Plan:   Problem List Items Addressed This Visit   None     No orders of the defined types were placed in this encounter.   Follow-up: No follow-ups on file.    Janet Lamprecht K  Tobie, MD

## 2023-11-23 NOTE — Assessment & Plan Note (Signed)
 Lab Results  Component Value Date   HGBA1C 5.8 (H) 08/10/2023   Well-controlled, but improving Has not been on Toujeo  now On Glipizide  5 mg once daily - now glycemic profile improved Follows up with Nephrologist for ESRD on HD On kidney transplant list at [facility]

## 2023-11-23 NOTE — Progress Notes (Deleted)
 Janet Mitchell, female    DOB: April 17, 1965    MRN: [id number]   Brief patient profile:  54  yobf  never smoker  referred to pulmonary clinic in [place]  08/29/2023 by Dr Tobie  for abnormal CT       Intracranial hemorrhage with dense left hemiparesis Large right thalamic bleed Obstructive hydrocephalus S/p shunt placement -Appreciate neurosurgery follow-up -On Spa[place] need for LTAC  Hypertensive emergency -Labetalol  as needed -Amlodipine , losartan   End-stage renal disease on hemodialysis -Renal service continues to follow -Avoid nephrotoxic's, renally dose medications -For dialysis 619  Chronic anemia due to end-stage renal disease -On iron minutes -Has remained stable  Hyperglycemia -Continue SSI  Respiratory failure -Off ventilator -Trach was changed 09/10/2021    History of Present Illness  08/29/2023  Pulmonary/ 1st office eval/ Janet Mitchell / [place] [facility] on MWF HD  Chief Complaint  Patient presents with   Shortness of Breath   Establish Care  Dyspnea:  howyer into chair / still able to go out church/ HD / minimal self propelling  Cough: p covid 2021 / worse when head hits pillow > clear mucus  Sleep: bed is electri x 45  pillows under head  SABA use: first things  02: none  Rec Prednisone  10 mg take  4 each am x 2 days,   2 each am x 2 days,  1 each am x 2 days and stop  Change protonix  to 40 mg  before first meal and add pepcid  20 mg an hour before bed.  If above not successful for the night time cough,   For drainage / throat tickle try take CHLORPHENIRAMINE  4 mg  (Allergy Relief 4mg   at [facility] should be easiest to find in the blue box usually on bottom shelf)  take one every 4 hours as needed - extremely effective and inexpensive over the counter- may cause drowsiness so start with just a dose or two an hour before bedtime and see how you tolerate it before trying in daytime.   - 08/29/2023 ACE level 79  - Blasto 08/29/2023    - Cyrto 08/29/2023  neg  - Quant Gold TB 08/29/2023 neg - ACE  08/29/23  - ESR 08/29/23  49  - Allergy screen 08/29/23 >  Eos 0.2 /  IgE  82   11/23/2023  f/u ov/[place] office/Steffon Gladu re: *** maint on ***  No chief complaint on file.   Dyspnea:  *** Cough: *** Sleeping: ***   resp cc  SABA use: *** 02: ***  Lung cancer screening: ***   No obvious day to day or daytime variability or assoc excess/ purulent sputum or mucus plugs or hemoptysis or cp or chest tightness, subjective wheeze or overt sinus or hb symptoms.    Also denies any obvious fluctuation of symptoms with weather or environmental changes or other aggravating or alleviating factors except as outlined above   No unusual exposure hx or h/o childhood pna/ asthma or knowledge of premature birth.  Current Allergies, Complete Past Medical History, Past Surgical History, Family History, and Social History were reviewed in [employer] record.  ROS  The following are not active complaints unless bolded Hoarseness, sore throat, dysphagia, dental problems, itching, sneezing,  nasal congestion or discharge of excess mucus or purulent secretions, ear ache,   fever, chills, sweats, unintended wt loss or wt gain, classically pleuritic or exertional cp,  orthopnea pnd or arm/hand swelling  or leg swelling, presyncope, palpitations, abdominal pain, anorexia,  nausea, vomiting, diarrhea  or change in bowel habits or change in bladder habits, change in stools or change in urine, dysuria, hematuria,  rash, arthralgias, visual complaints, headache, numbness, weakness or ataxia or problems with walking or coordination,  change in mood or  memory.        No outpatient medications have been marked as taking for the 11/23/23 encounter (Appointment) with Darlean Ozell NOVAK, MD.            Past Medical History:  Diagnosis Date   Abdominal wall hematoma 12/26/2019   Anemia    Arthritis    CHF (congestive heart failure) (HCC)    pt states she has  been cleared of heart failure/disease   Chronic cholecystitis with calculus    COVID-19 03/26/2019   COVID-19 virus infection 03/2019   ESRD (end stage renal disease) on dialysis [facility])    Dialysis M-W-F   Headache    a few/wk (03/09/2018) - no longer having these   History of blood transfusion 10/2017   low blood count (03/09/2018)   Hypertension    OSA (obstructive sleep apnea) 10/04/2022   Respiratory failure requiring intubation (HCC)    Seasonal allergies    Spinal headache    Status post insertion of percutaneous endoscopic gastrostomy (PEG) tube (HCC) 01/26/2022   Type II diabetes mellitus (HCC)       Objective:     Wt Readings from Last 3 Encounters:  10/12/23 270 lb (122.5 kg)  10/05/23 280 lb 3.2 oz (127.1 kg)  08/10/23 264 lb 6.4 oz (119.9 kg)      Vital signs reviewed  11/23/2023  - Note at rest 02 sats  ***% on ***   General appearance:    ***    Vital signs reviewed  11/23/2023  - Note at rest 02 sats  ***% on ***   General appearance:    ***         Assessment

## 2023-11-23 NOTE — Assessment & Plan Note (Signed)
 BP Readings from Last 1 Encounters:  11/23/23 138/84   Well-controlled with amlodipine  5 mg QD and carvedilol  6.25 mg BID Considering hypotensive spells during HD sessions, had advised to avoid carvedilol  morning dose on HD days Counseled for compliance with the medications Advised to follow renal diet

## 2023-11-23 NOTE — Assessment & Plan Note (Signed)
 STOP-BANG: 7 Sleep study showed severe OSA Has started using CPAP device regularly, advised to use it at least 6 hours per night She has mild anxiety due to it, advised to check for different mask with DME If persistent concern, will refer to Sleep specialist

## 2023-11-23 NOTE — Patient Instructions (Signed)
 Please contact [facility] for discussing need for colonoscopy. [phone]  Please continue to take medications as prescribed.  Please continue to follow low carb diet.

## 2023-11-24 NOTE — Assessment & Plan Note (Addendum)
 Physical exam as documented. Recent blood tests requested from HD center. Advised to get Shingrix vaccines at local pharmacy. Advised to contact GI for repeat colonoscopy.

## 2023-11-24 NOTE — Assessment & Plan Note (Signed)
 Improved with trazodone  as needed Sleep hygiene discussed Had sleep study - has OSA, has CPAP now

## 2023-11-24 NOTE — Assessment & Plan Note (Signed)
 Followed by Ophthalmology Has had vitreous hemorrhage, s/p vitrectomy in 07/24

## 2023-11-25 LAB — BAYER DCA HB A1C WAIVED: HB A1C (BAYER DCA - WAIVED): 6.7 % — ABNORMAL HIGH (ref 4.8–5.6)

## 2023-11-27 ENCOUNTER — Ambulatory Visit: Payer: Self-pay | Admitting: Internal Medicine

## 2023-11-27 DIAGNOSIS — Z992 Dependence on renal dialysis: Secondary | ICD-10-CM | POA: Diagnosis not present

## 2023-11-27 DIAGNOSIS — N186 End stage renal disease: Secondary | ICD-10-CM | POA: Diagnosis not present

## 2023-11-28 ENCOUNTER — Other Ambulatory Visit: Payer: Self-pay

## 2023-11-28 DIAGNOSIS — I10 Essential (primary) hypertension: Secondary | ICD-10-CM

## 2023-11-28 DIAGNOSIS — J209 Acute bronchitis, unspecified: Secondary | ICD-10-CM

## 2023-11-28 DIAGNOSIS — N185 Chronic kidney disease, stage 5: Secondary | ICD-10-CM

## 2023-11-28 DIAGNOSIS — F5101 Primary insomnia: Secondary | ICD-10-CM

## 2023-11-28 MED ORDER — ALBUTEROL SULFATE HFA 108 (90 BASE) MCG/ACT IN AERS: 2.0000 | INHALATION_SPRAY | Freq: Four times a day (QID) | RESPIRATORY_TRACT | 2 refills | Status: DC | PRN

## 2023-11-28 MED ORDER — RENA-VITE PO TABS: 1.0000 | ORAL_TABLET | Freq: Every day | ORAL | 0 refills | Status: DC

## 2023-11-28 MED ORDER — CALCITRIOL 0.5 MCG PO CAPS
0.5000 ug | ORAL_CAPSULE | ORAL | 0 refills | Status: DC
Start: 2023-11-28 — End: 2023-12-04

## 2023-11-28 MED ORDER — CARVEDILOL 6.25 MG PO TABS: 6.2500 mg | ORAL_TABLET | Freq: Two times a day (BID) | ORAL | 2 refills | Status: DC

## 2023-11-28 MED ORDER — TRAZODONE HCL 50 MG PO TABS: 25.0000 mg | ORAL_TABLET | Freq: Every evening | ORAL | 3 refills | Status: DC | PRN

## 2023-11-28 MED ORDER — GLIPIZIDE ER 5 MG PO TB24: 5.0000 mg | ORAL_TABLET | Freq: Every day | ORAL | 3 refills | Status: DC

## 2023-11-29 ENCOUNTER — Other Ambulatory Visit: Payer: Self-pay | Admitting: Internal Medicine

## 2023-11-29 DIAGNOSIS — F5101 Primary insomnia: Secondary | ICD-10-CM

## 2023-11-29 DIAGNOSIS — Z992 Dependence on renal dialysis: Secondary | ICD-10-CM | POA: Diagnosis not present

## 2023-11-29 DIAGNOSIS — I10 Essential (primary) hypertension: Secondary | ICD-10-CM

## 2023-11-29 DIAGNOSIS — E1122 Type 2 diabetes mellitus with diabetic chronic kidney disease: Secondary | ICD-10-CM

## 2023-11-29 DIAGNOSIS — J209 Acute bronchitis, unspecified: Secondary | ICD-10-CM

## 2023-11-29 NOTE — Telephone Encounter (Signed)
 Copied from CRM [id number]. Topic: Clinical - Medication Refill >> Nov 29, 2023  9:41 AM Turkey B wrote: Medication:  Levofloxacin  250mg / GlipiZIDE / (GLUCOTROL  XL) 5 MG/ TraZODone  (DESYREL ) 50 MG tablet/ /calcitRIOL  (ROCALTROL ) 0.5 MCG capsule / Renavite /cetirizine  (ZYRTEC ) 10 MG tablet/ Carvedilol  (COREG ) 6.25 MG tablet/ Albuterol  108 90 base   Previous request went to the wrong pharmacy address   Has the patient contacted their pharmacy? Yes  [facility] This is the patient's preferred pharmacy:  [facility] [phone] [place]    Is this the correct pharmacy for this prescription? yes If no, delete pharmacy and type the correct one.   Has the prescription been filled recently? no  Is the patient out of the medication? yes  Has the patient been seen for an appointment in the last year OR does the patient have an upcoming appointment? yes  Can we respond through MyChart? yes  Agent: Please be advised that Rx refills may take up to 3 business days. We ask that you follow-up with your pharmacy.

## 2023-12-01 ENCOUNTER — Telehealth: Payer: Self-pay

## 2023-12-01 DIAGNOSIS — N186 End stage renal disease: Secondary | ICD-10-CM | POA: Diagnosis not present

## 2023-12-01 NOTE — Telephone Encounter (Signed)
 Copied from CRM [id number]. Topic: Medical Record Request - Provider/Facility Request >> Dec 01, 2023 11:27 AM Wyona SQUIBB wrote: Reason for CRM: Melissa from [employer] called in to report fax was sent over on 11/29/2023 to have pt medication sent over. They have yet to received.    Please confirm if fax has been received.    Callback [phone]   Fax - [phone]

## 2023-12-04 ENCOUNTER — Telehealth: Payer: Self-pay

## 2023-12-04 ENCOUNTER — Other Ambulatory Visit: Payer: Self-pay | Admitting: Internal Medicine

## 2023-12-04 DIAGNOSIS — N186 End stage renal disease: Secondary | ICD-10-CM | POA: Diagnosis not present

## 2023-12-04 DIAGNOSIS — Z992 Dependence on renal dialysis: Secondary | ICD-10-CM | POA: Diagnosis not present

## 2023-12-04 DIAGNOSIS — I10 Essential (primary) hypertension: Secondary | ICD-10-CM

## 2023-12-04 DIAGNOSIS — E1122 Type 2 diabetes mellitus with diabetic chronic kidney disease: Secondary | ICD-10-CM

## 2023-12-04 MED ORDER — AMLODIPINE BESYLATE 5 MG PO TABS: 5.0000 mg | ORAL_TABLET | Freq: Every evening | ORAL | 3 refills | Status: AC

## 2023-12-04 MED ORDER — CARVEDILOL 6.25 MG PO TABS: 6.2500 mg | ORAL_TABLET | Freq: Two times a day (BID) | ORAL | 2 refills | Status: AC

## 2023-12-04 MED ORDER — GLIPIZIDE ER 5 MG PO TB24: 5.0000 mg | ORAL_TABLET | Freq: Every day | ORAL | 3 refills | Status: AC

## 2023-12-04 MED ORDER — RENA-VITE PO TABS: 1.0000 | ORAL_TABLET | Freq: Every day | ORAL | 3 refills | Status: AC

## 2023-12-04 MED ORDER — CALCITRIOL 0.5 MCG PO CAPS: 0.5000 ug | ORAL_CAPSULE | ORAL | 3 refills | Status: AC

## 2023-12-04 NOTE — Telephone Encounter (Signed)
 Copied from CRM [id number]. Topic: Clinical - Prescription Issue >> Dec 01, 2023 11:36 AM Rozanna MATSU wrote: Reason for CRM: [facility] in[facility] to the predniSONE  (DELTASONE ) 20 MG tablet and famotidine  (PEPCID ) 20 MG tablet stateed fax on 09/03 not showing in chart she will refax. Stated she is requesting to have the prescription faxd to this number [phone].

## 2023-12-04 NOTE — Telephone Encounter (Signed)
 Pt is just now stated we sent the medication to the wrong pharmacy back in in June at her OV - pt is now requesting it be sent to the correct pharmacy - the medication is prednisone  and pecid, the direction on the prednisone  was a taper of 14 tabs. Would you like me to sent this the the desired rx or have pt make an appt since its been 3 months since you  recommended she take these medications, I am assuming pt has been with out these since we sent these in for her at that visit.

## 2023-12-05 ENCOUNTER — Telehealth: Payer: Self-pay

## 2023-12-05 ENCOUNTER — Other Ambulatory Visit: Payer: Self-pay

## 2023-12-05 ENCOUNTER — Telehealth: Payer: Self-pay | Admitting: *Deleted

## 2023-12-05 MED ORDER — PREDNISONE 10 MG PO TABS: 10.0000 mg | ORAL_TABLET | Freq: Every day | ORAL | 0 refills | Status: DC

## 2023-12-05 MED ORDER — FAMOTIDINE 20 MG PO TABS: ORAL_TABLET | ORAL | 11 refills | Status: AC

## 2023-12-05 NOTE — Telephone Encounter (Signed)
 Copied from CRM [id number]. Topic: Clinical - Prescription Issue >> Dec 05, 2023 10:30 AM Ismael A wrote: Reason for CRM: Olam at [employer] will be sending over fax regarding RX of predniSONE  (DELTASONE ) 20 MG tablet and famotidine  (PEPCID ) 20 MG - the fax would need to be filled out and sent back [phone]  - would like to have this filled out asap

## 2023-12-05 NOTE — Telephone Encounter (Signed)
 Sent in to correct pharm

## 2023-12-05 NOTE — Telephone Encounter (Signed)
 Copied from CRM [id number]. Topic: Clinical - Prescription Issue >> Dec 01, 2023 11:36 AM Rozanna MATSU wrote: Reason for CRM: [facility] in[facility] to the predniSONE  (DELTASONE ) 20 MG tablet and famotidine  (PEPCID ) 20 MG tablet stateed fax on 09/03 not showing in chart she will refax. Stated she is requesting to have the prescription faxd to this number [phone].

## 2023-12-05 NOTE — Telephone Encounter (Signed)
 Rx sent today.

## 2023-12-05 NOTE — Telephone Encounter (Signed)
 Rx sent today,

## 2023-12-05 NOTE — Telephone Encounter (Signed)
 Fixed the pharmacy and refilled the meds

## 2023-12-06 DIAGNOSIS — N186 End stage renal disease: Secondary | ICD-10-CM | POA: Diagnosis not present

## 2023-12-06 DIAGNOSIS — Z992 Dependence on renal dialysis: Secondary | ICD-10-CM | POA: Diagnosis not present

## 2023-12-07 ENCOUNTER — Other Ambulatory Visit: Payer: Self-pay | Admitting: Internal Medicine

## 2023-12-07 DIAGNOSIS — J209 Acute bronchitis, unspecified: Secondary | ICD-10-CM

## 2023-12-07 NOTE — Telephone Encounter (Signed)
 Copied from CRM #[id number]. Topic: Clinical - Medication Question >> Dec 07, 2023  1:54 PM Avram MATSU wrote: Reason for CRM: l[facility] and is requesting these medication to be sent to the pharmacy.   predniSONE  20mg  levofloxacine 250 mg rena 5 rx tablet cetirizine  10 mg  albuterol  90 UG  Fax: [phone]

## 2023-12-11 DIAGNOSIS — Z992 Dependence on renal dialysis: Secondary | ICD-10-CM | POA: Diagnosis not present

## 2023-12-12 ENCOUNTER — Ambulatory Visit: Admitting: Internal Medicine

## 2023-12-13 DIAGNOSIS — N186 End stage renal disease: Secondary | ICD-10-CM | POA: Diagnosis not present

## 2023-12-14 ENCOUNTER — Other Ambulatory Visit: Payer: Self-pay | Admitting: Internal Medicine

## 2023-12-14 DIAGNOSIS — J209 Acute bronchitis, unspecified: Secondary | ICD-10-CM

## 2023-12-14 MED ORDER — ALBUTEROL SULFATE HFA 108 (90 BASE) MCG/ACT IN AERS: 2.0000 | INHALATION_SPRAY | Freq: Four times a day (QID) | RESPIRATORY_TRACT | 2 refills | Status: DC | PRN

## 2023-12-14 NOTE — Telephone Encounter (Signed)
 Copied from CRM [id number]. Topic: Clinical - Medication Refill >> Dec 14, 2023  2:24 PM Turkey B wrote: Medication:  Albuterol  Sulfate /predniSONE10mg / Sevelamer  Carbonate 1,600 mg/Cetirizine  HCl 10 mg Previus request had wrong ddress for pharmacy  Has the patient contacted their pharmacy? yes ((Agent: If yes, when and what did the pharmacy advise?)pharmacy called directly in  This is the patient's preferred pharmacy:   [facility] [place] [place] - [address] [place] [address] Phone: [phone] Fax: [phone]  Is this the correct pharmacy for this prescription? yes  Has the prescription been filled recently? no  Is the patient out of the medication? yes  Has the patient been seen for an appointment in the last year OR does the patient have an upcoming appointment? yes  Can we respond through MyChart? yes  Agent: Please be advised that Rx refills may take up to 3 business days. We ask that you follow-up with your pharmacy.

## 2023-12-15 DIAGNOSIS — Z992 Dependence on renal dialysis: Secondary | ICD-10-CM | POA: Diagnosis not present

## 2023-12-18 ENCOUNTER — Other Ambulatory Visit: Payer: Self-pay

## 2023-12-18 ENCOUNTER — Telehealth: Payer: Self-pay | Admitting: Internal Medicine

## 2023-12-18 DIAGNOSIS — Z992 Dependence on renal dialysis: Secondary | ICD-10-CM | POA: Diagnosis not present

## 2023-12-18 NOTE — Telephone Encounter (Signed)
Forms were picked up. 

## 2023-12-18 NOTE — Telephone Encounter (Signed)
 Letter typed up emailed from email:  [email or web address]

## 2023-12-18 NOTE — Telephone Encounter (Signed)
 Pt states he will come by to pick up a copy as well.

## 2023-12-18 NOTE — Telephone Encounter (Signed)
 Copied from CRM [id number]. Topic: Medical Record Request - Other >> Dec 18, 2023  9:44 AM Berwyn MATSU wrote: Reason for CRM: Patient spouse Alphony  called in requesting a letter to show that he is her caretaker. Per spouse patient  needs 24 hour care. Per patients spouse this is needed for asocial services even though he is Power of [employer].  Patients spouse is requesting this to be emailed to:  [email or web address]   He is also requesting a copy he can pick up.  May you please assist.

## 2023-12-19 ENCOUNTER — Telehealth: Payer: Self-pay

## 2023-12-19 NOTE — Telephone Encounter (Signed)
 Copied from CRM #[id number]. Topic: General - Other >> Dec 19, 2023 11:38 AM Debby BROCKS wrote: Reason for CRM: [name] from [place] social services received a letter from the [employer] that the patient is dependant upon her husband for 24/7 care. They need to know if this is permanent or if this is temporary while the patient heals

## 2023-12-20 ENCOUNTER — Ambulatory Visit: Payer: Self-pay

## 2023-12-20 DIAGNOSIS — N186 End stage renal disease: Secondary | ICD-10-CM | POA: Diagnosis not present

## 2023-12-20 DIAGNOSIS — Z992 Dependence on renal dialysis: Secondary | ICD-10-CM | POA: Diagnosis not present

## 2023-12-22 ENCOUNTER — Other Ambulatory Visit: Payer: Self-pay | Admitting: Internal Medicine

## 2023-12-22 MED ORDER — CETIRIZINE HCL 10 MG PO TABS: 10.0000 mg | ORAL_TABLET | Freq: Every day | ORAL | 0 refills | Status: DC

## 2023-12-22 NOTE — Telephone Encounter (Signed)
 Copied from CRM [id number]. Topic: Clinical - Medication Refill >> Dec 22, 2023  2:01 PM Emylou G wrote: Medication: cetirizine  (ZYRTEC ) 10 MG tablet  Has the patient contacted their pharmacy? Yes (Agent: If no, request that the patient contact the pharmacy for the refill. If patient does not wish to contact the pharmacy document the reason why and proceed with request.) (Agent: If yes, when and what did the pharmacy advise?) they called us   This is the patient's preferred pharmacy:    [facility] - [place] [place] - [address] [place] [address] Phone: [phone] Fax: [phone]  Is this the correct pharmacy for this prescription? Yes If no, delete pharmacy and type the correct one.   Has the prescription been filled recently? No  Is the patient out of the medication? Yes  Has the patient been seen for an appointment in the last year OR does the patient have an upcoming appointment? Yes  Can we respond through MyChart? No  Agent: Please be advised that Rx refills may take up to 3 business days. We ask that you follow-up with your pharmacy.

## 2023-12-25 ENCOUNTER — Ambulatory Visit (INDEPENDENT_AMBULATORY_CARE_PROVIDER_SITE_OTHER): Payer: 59

## 2023-12-25 VITALS — Ht 68.0 in | Wt 271.0 lb

## 2023-12-25 DIAGNOSIS — Z0000 Encounter for general adult medical examination without abnormal findings: Secondary | ICD-10-CM | POA: Diagnosis not present

## 2023-12-25 NOTE — Patient Instructions (Signed)
 Janet Mitchell,  Thank you for taking the time for your Medicare Wellness Visit. I appreciate your continued commitment to your health goals. Please review the care plan we discussed, and feel free to reach out if I can assist you further.  Medicare recommends these wellness visits once per year to help you and your care team stay ahead of potential health issues. These visits are designed to focus on prevention, allowing your provider to concentrate on managing your acute and chronic conditions during your regular appointments.  Please note that Annual Wellness Visits do not include a physical exam. Some assessments may be limited, especially if the visit was conducted virtually. If needed, we may recommend a separate in-person follow-up with your provider.  Wishing you excellent health and many blessings in the year to come!  -Rekisha Welling, CMA  Ongoing Care Seeing your primary care provider every 3 to 6 months helps us  monitor your health and provide consistent, personalized care.   Recommended Screenings:  Health Maintenance  Topic Date Due   Hepatitis B Vaccine (3 of 3 - 19+ 3-dose series) 05/18/2016   Zoster (Shingles) Vaccine (1 of 2) Never done   Pap with HPV screening  05/09/2023   Stool Blood Test  07/15/2023   Flu Shot  10/27/2023   COVID-19 Vaccine (6 - 2025-26 season) 11/27/2023   Hemoglobin A1C  05/25/2024   Breast Cancer Screening  08/27/2024   Eye exam for diabetics  11/12/2024   Complete foot exam   11/22/2024   Medicare Annual Wellness Visit  12/24/2024   Colon Cancer Screening  10/23/2028   DTaP/Tdap/Td vaccine (2 - Td or Tdap) 04/07/2029   Pneumococcal Vaccine for age over 62  Completed   Hepatitis C Screening  Completed   HIV Screening  Completed   HPV Vaccine  Aged Out   Meningitis B Vaccine  Aged Out       12/25/2023    2:19 PM  Advanced Directives  Does Patient Have a Medical Advance Directive? No  Would patient like information on creating a medical advance  directive? No - Patient declined   Advance Care Planning is important because it: Ensures you receive medical care that aligns with your values, goals, and preferences. Provides guidance to your family and loved ones, reducing the emotional burden of decision-making during critical moments.  Vision: Annual vision screenings are recommended for early detection of glaucoma, cataracts, and diabetic retinopathy. These exams can also reveal signs of chronic conditions such as diabetes and high blood pressure.  Dental: Annual dental screenings help detect early signs of oral cancer, gum disease, and other conditions linked to overall health, including heart disease and diabetes.  Please see the attached documents for additional preventive care recommendations.

## 2023-12-25 NOTE — Progress Notes (Signed)
 Patient is due for a cervical cancer screening and colon cancer screening. Please place those referrals if appropriate in this patients situation.   Subjective:   Janet Mitchell is a 58 y.o. who presents for a Medicare Wellness preventive visit.  As a reminder, Annual Wellness Visits don't include a physical exam, and some assessments may be limited, especially if this visit is performed virtually. We may recommend an in-person follow-up visit with your provider if needed.  Visit Complete: Virtual I connected with  Janet Mitchell on 12/25/23 by a audio enabled telemedicine application and verified that I am speaking with the correct person using two identifiers.  Patient Location: Home  Provider Location: Home Office  I discussed the limitations of evaluation and management by telemedicine. The patient expressed understanding and agreed to proceed.  Vital Signs: Because this visit was a virtual/telehealth visit, some criteria may be missing or patient reported. Any vitals not documented were not able to be obtained and vitals that have been documented are patient reported.  VideoDeclined- This patient declined Librarian, academic. Therefore the visit was completed with audio only.  Persons Participating in Visit: Patient assisted by Calleen Pinnix spouse.  AWV Questionnaire: No: Patient Medicare AWV questionnaire was not completed prior to this visit.  Cardiac Risk Factors include: advanced age (>23men, >33 women);sedentary lifestyle;diabetes mellitus;dyslipidemia;hypertension;obesity (BMI >30kg/m2)     Objective:    Today's Vitals   12/25/23 1421  Weight: 271 lb (122.9 kg)  Height: 5' 8 (1.727 m)   Body mass index is 41.21 kg/m.     12/25/2023    2:19 PM 12/20/2022   11:29 AM 03/04/2022   10:51 AM 02/24/2022    8:37 PM 11/30/2021   11:58 AM 10/27/2021   10:12 AM 06/10/2020    2:06 PM  Advanced Directives  Does Patient  Have a Medical Advance Directive? No Yes No No Yes No No  Type of Special educational needs teacher of [place];Living will   Healthcare Power of [place];Living will    Does patient want to make changes to medical advance directive?     No - Patient declined    Copy of Healthcare Power of Attorney in Chart?  No - copy requested   No - copy requested    Would patient like information on creating a medical advance directive? No - Patient declined  No - Patient declined    No - Patient declined    Current Medications (verified) Outpatient Encounter Medications as of 12/25/2023  Medication Sig   albuterol  (VENTOLIN  HFA) 108 (90 Base) MCG/ACT inhaler Inhale 2 puffs into the lungs every 6 (six) hours as needed for wheezing or shortness of breath.   amLODipine  (NORVASC ) 5 MG tablet Take 1 tablet (5 mg total) by mouth every evening.   calcitRIOL  (ROCALTROL ) 0.5 MCG capsule Take 1 capsule (0.5 mcg total) by mouth every other day.   carvedilol  (COREG ) 6.25 MG tablet Take 1 tablet (6.25 mg total) by mouth 2 (two) times daily with a meal.   cetirizine  (ZYRTEC ) 10 MG tablet Take 1 tablet (10 mg total) by mouth daily.   famotidine  (PEPCID ) 20 MG tablet One after supper   glipiZIDE  (GLUCOTROL  XL) 5 MG 24 hr tablet Take 1 tablet (5 mg total) by mouth daily with breakfast.   multivitamin (RENA-VIT) TABS tablet Take 1 tablet by mouth daily.   pantoprazole  (PROTONIX ) 40 MG tablet Take 1 tablet (40 mg total) by mouth daily. (Patient taking differently: Take 40  mg by mouth daily as needed (for acid reflux).)   predniSONE  (DELTASONE ) 10 MG tablet Take 1 tablet (10 mg total) by mouth daily with breakfast.   sevelamer  carbonate (RENVELA ) 800 MG tablet 1,600 mg 3 (three) times daily with meals.   traMADol (ULTRAM) 50 MG tablet Take 50 mg by mouth daily as needed for moderate pain (pain score 4-6) or severe pain (pain score 7-10).   traZODone  (DESYREL ) 50 MG tablet Take 0.5-1 tablets (25-50 mg total) by mouth at  bedtime as needed. for sleep   No facility-administered encounter medications on file as of 12/25/2023.    Allergies (verified) Patient has no known allergies.   History: Past Medical History:  Diagnosis Date   Abdominal wall hematoma 12/26/2019   Anemia    Arthritis    CHF (congestive heart failure) (HCC)    pt states she has been cleared of heart failure/disease   Chronic cholecystitis with calculus    COVID-19 03/26/2019   COVID-19 virus infection 03/2019   ESRD (end stage renal disease) on dialysis [facility])    Dialysis M-W-F   Headache    a few/wk (03/09/2018) - no longer having these   History of blood transfusion 10/2017   low blood count (03/09/2018)   Hypertension    OSA (obstructive sleep apnea) 10/04/2022   Respiratory failure requiring intubation (HCC)    Seasonal allergies    Spinal headache    Status post insertion of percutaneous endoscopic gastrostomy (PEG) tube (HCC) 01/26/2022   Type II diabetes mellitus (HCC)    Past Surgical History:  Procedure Laterality Date   A/V SHUNT INTERVENTION Right 09/26/2023   Procedure: A/V SHUNT INTERVENTION;  Surgeon: Lanis Fonda BRAVO, MD;  Location: [facility];  Service: Cardiovascular;  Laterality: Right;   AMPUTATION TOE Left 2013   Great toe   AV FISTULA PLACEMENT Left 07/22/2019   Procedure: LEFT ARM BASILIC ARTERIOVENOUS (AV) FISTULA CREATION;  Surgeon: Oris Krystal FALCON, MD;  Location: [facility];  Service: Vascular;  Laterality: Left;   BASCILIC VEIN TRANSPOSITION Left 10/17/2019   Procedure: LEFT SECOND STAGE BASCILIC VEIN TRANSPOSITION;  Surgeon: Oris Krystal FALCON, MD;  Location: [facility];  Service: Vascular;  Laterality: Left;   BIOPSY  10/24/2018   Procedure: BIOPSY;  Surgeon: Shaaron Lamar HERO, MD;  [facility];  Service: Endoscopy;;  right and left colon   CATARACT EXTRACTION W/ INTRAOCULAR LENS IMPLANT Right    CESAREAN SECTION  1994; 1998   CHOLECYSTECTOMY N/A 03/09/2018   Procedure: LAPAROSCOPIC CHOLECYSTECTOMY  WITH INTRAOPERATIVE CHOLANGIOGRAM ERAS PATHWAY;  Surgeon: Belinda Cough, MD;  Location: [facility] OR;  Service: General;  Laterality: N/A;   COLONOSCOPY N/A 10/24/2018   Surgeon: Shaaron Lamar HERO, MD; 15 mm adenomatous polyp in the transverse colon removed, segmental biopsies benign.   EYE SURGERY Left 05/15/2018   Removal of blood in the globe (due to DM)   FLEXIBLE SIGMOIDOSCOPY N/A 11/24/2017   Procedure: FLEXIBLE SIGMOIDOSCOPY;  Surgeon: Shaaron Lamar HERO, MD;  [facility];  Service: Endoscopy;  Laterality: N/A;   IR FLUORO GUIDE CV LINE RIGHT  07/17/2019   IR GASTROSTOMY TUBE MOD SED  09/16/2021   IR PARACENTESIS  07/09/2019   IR US  GUIDE VASC ACCESS RIGHT  07/17/2019   LAPAROSCOPIC CHOLECYSTECTOMY  03/09/2018   LAPAROSCOPIC REVISION VENTRICULAR-PERITONEAL (V-P) SHUNT  09/08/2021   Procedure: LAPAROSCOPIC ASSISTANCE FOR  VENTRICULAR-PERITONEAL (V-P) SHUNT PLACEMENT ;  Surgeon: Debby Dorn MATSU, MD;  Location: [facility];  Service: Neurosurgery;;  PERITONEAL CATHETER INSERTION  2017   POLYPECTOMY  10/24/2018   Procedure: POLYPECTOMY;  Surgeon: Shaaron Lamar HERO, MD;  [facility];  Service: Endoscopy;;   TOTAL HIP ARTHROPLASTY Left 1997   VENOUS ANGIOPLASTY  09/26/2023   Procedure: VENOUS ANGIOPLASTY;  Surgeon: Lanis Fonda BRAVO, MD;  Location: [facility];  Service: Cardiovascular;;   VENTRICULOPERITONEAL SHUNT N/A 09/08/2021   Procedure: SHUNT INSERTION VENTRICULAR-PERITONEAL;  Surgeon: Debby Dorn MATSU, MD;  Location[facility];  Service: Neurosurgery;  Laterality: N/A;   Family History  Problem Relation Age of Onset   Heart disease Mother    Thrombocytopenia Mother        TTP   Heart failure Father    Kidney disease Paternal Grandfather    Colon cancer Neg Hx    Social History   Socioeconomic History   Marital status: Married    Spouse name: Not on file   Number of children: 2   Years of education: Not on file   Highest education level: Not on file  Occupational  History   Not on file  Tobacco Use   Smoking status: Never    Passive exposure: Yes   Smokeless tobacco: Never  Vaping Use   Vaping status: Never Used  Substance and Sexual Activity   Alcohol  use: Never   Drug use: Never   Sexual activity: Yes    Birth control/protection: I.U.D.    Comment: Mirena  IUD  Other Topics Concern   Not on file  Social History Narrative   Not on file   Social Drivers of Health   Financial Resource Strain: Low Risk  (12/25/2023)   Overall Financial Resource Strain (CARDIA)    Difficulty of Paying Living Expenses: Not hard at all  Food Insecurity: No Food Insecurity (12/25/2023)   Hunger Vital Sign    Worried About Running Out of Food in the Last Year: Never true    Ran Out of Food in the Last Year: Never true  Transportation Needs: No Transportation Needs (12/25/2023)   PRAPARE - Administrator, Civil Service (Medical): No    Lack of Transportation (Non-Medical): No  Physical Activity: Inactive (12/25/2023)   Exercise Vital Sign    Days of Exercise per Week: 0 days    Minutes of Exercise per Session: 0 min  Stress: No Stress Concern Present (12/25/2023)   [employer] of Occupational Health - Occupational Stress Questionnaire    Feeling of Stress: Not at all  Social Connections: Moderately Integrated (12/25/2023)   Social Connection and Isolation Panel    Frequency of Communication with Friends and Family: More than three times a week    Frequency of Social Gatherings with Friends and Family: More than three times a week    Attends Religious Services: More than 4 times per year    Active Member of [employer] or Organizations: No    Attends Engineer, structural: Never    Marital Status: Married    Tobacco Counseling Counseling given: Yes    Clinical Intake:  Pre-visit preparation completed: Yes  Pain : No/denies pain     BMI - recorded: 41.21 Nutritional Status: BMI > 30  Obese Diabetes: Yes CBG done?: No Did pt.  bring in CBG monitor from home?: No  Lab Results  Component Value Date   HGBA1C 6.7 (H) 11/23/2023   HGBA1C 5.8 (H) 08/10/2023   HGBA1C 7.6 (H) 01/11/2023     How often do you need to have someone help you  when you read instructions, pamphlets, or other written materials from your doctor or pharmacy?: 4 - Often (patient has memory deficits following a stroke in 2025)     Information entered by :: Tyland Klemens W CMA (AAMA)   Activities of Daily Living     12/25/2023    4:33 PM 09/26/2023    9:32 AM  In your present state of health, do you have any difficulty performing the following activities:  Hearing? 1 0  Vision? 1 0  Difficulty concentrating or making decisions? 1 0  Walking or climbing stairs? 1   Dressing or bathing? 1   Doing errands, shopping? 1   Preparing Food and eating ? Y   Using the Toilet? Y   In the past six months, have you accidently leaked urine? Y   Do you have problems with loss of bowel control? Y   Managing your Medications? Y   Managing your Finances? Y   Housekeeping or managing your Housekeeping? Y     Patient Care Team: Tobie Suzzane POUR, MD as PCP - General (Internal Medicine) Shaaron Lamar HERO, MD as Consulting Physician (Gastroenterology) Dialysis, Larue Tinnie Darlean Ozell KATHEE, MD as Consulting Physician (Pulmonary Disease) Cheree Rollene Knee, MD as Referring Physician (Ophthalmology) Alto Cough, MD as Attending Physician (Internal Medicine) Rudy Josette GORMAN DEVONNA as Physician Assistant (Gastroenterology) Cindie Carlin POUR, DO as Consulting Physician (Gastroenterology) Margaret Eduard SAUNDERS, MD as Consulting Physician (Neurology) Silva Juliene SAUNDERS, DPM as Consulting Physician (Podiatry)  I have updated your Care Teams any recent Medical Services you may have received from other providers in the past year.     Assessment:   This is a routine wellness examination for [facility].  Hearing/Vision screen Hearing Screening - Comments:: Patient  denies any hearing difficulties.   Vision Screening - Comments:: Wears rx glasses - up to date with routine eye exams with  Dr. Rollene Cheree   Goals Addressed               This Visit's Progress     Be able to walk. Currently wheelchair bound (pt-stated)        I want to be able to walk again. I am wheelchair bound since have a stroke last year.          Depression Screen     12/25/2023    4:32 PM 11/23/2023   10:44 AM 10/09/2023    3:10 PM 08/10/2023    3:14 PM 03/28/2023    3:22 PM 01/11/2023    2:37 PM 12/20/2022   11:34 AM  PHQ 2/9 Scores  PHQ - 2 Score 0 0 0 0 0 0 0  PHQ- 9 Score 0 0  0        Fall Risk     12/25/2023    4:32 PM 12/25/2023    2:36 PM 10/09/2023    3:11 PM 08/10/2023    3:14 PM 01/11/2023    2:37 PM  Fall Risk   Falls in the past year? 0 0 0 0 0  Number falls in past yr: 0 0  0 0  Injury with Fall? 0 0  0 0  Risk for fall due to : Impaired balance/gait;Impaired mobility;Other (Comment) Impaired balance/gait;Impaired mobility;Other (Comment)  No Fall Risks No Fall Risks  Risk for fall due to: Comment s/p stroke 2025 stroke     Follow up Falls evaluation completed;Education provided;Falls prevention discussed Falls prevention discussed;Education provided;Falls evaluation completed  Falls evaluation completed Falls evaluation completed  MEDICARE RISK AT HOME:  Medicare Risk at Home Any stairs in or around the home?: No If so, are there any without handrails?: No Home free of loose throw rugs in walkways, pet beds, electrical cords, etc?: Yes Adequate lighting in your home to reduce risk of falls?: Yes Life alert?: No Use of a cane, walker or w/c?: Yes Grab bars in the bathroom?: Yes Shower chair or bench in shower?: Yes Elevated toilet seat or a handicapped toilet?: Yes  TIMED UP AND GO:  Was the test performed?  No  Cognitive Function: Unable: Due to language barrier, hearing or vision limitations or othermemory deficits after having  stroke last year    12/25/2023    2:38 PM  MMSE - Mini Mental State Exam  Not completed: Unable to complete        12/20/2022   11:33 AM 11/30/2021   11:59 AM 06/10/2020    1:59 PM  6CIT Screen  What Year? 0 points 0 points 0 points  What month? 0 points 0 points 0 points  What time? 0 points 0 points 0 points  Count back from 20 0 points 0 points 0 points  Months in reverse 0 points 0 points 0 points  Repeat phrase 0 points 0 points 0 points  Total Score 0 points 0 points 0 points    Immunizations Immunization History  Administered Date(s) Administered   Hepatitis B, PED/ADOLESCENT 11/16/2015, 12/11/2015, 01/08/2016, 02/26/2016   Influenza Split 05/27/2015, 12/11/2015, 01/04/2016, 01/27/2017, 01/15/2018, 01/03/2019   Influenza,inj,Quad PF,6+ Mos 12/27/2019, 01/03/2022   Influenza,inj,Quad PF,6-35 Mos 12/27/2019   Influenza-Unspecified 05/27/2015, 12/11/2015, 01/04/2016, 01/27/2017, 01/15/2018, 01/03/2019, 12/26/2019, 01/22/2021   Moderna Sars-Covid-2 Vaccination 06/08/2019, 08/09/2019, 08/29/2019   PPD Test 05/03/2016, 05/05/2017, 05/14/2018, 01/10/2020, 02/15/2021   Pneumococcal Conjugate-13 05/27/2015, 04/06/2018   Pneumococcal-Unspecified 05/27/2015, 04/06/2018   Tdap 04/08/2019   Unspecified SARS-COV-2 Vaccination 06/14/2019, 08/09/2019    Screening Tests Health Maintenance  Topic Date Due   Hepatitis B Vaccines 19-59 Average Risk (3 of 3 - 19+ 3-dose series) 05/18/2016   Zoster Vaccines- Shingrix (1 of 2) Never done   Cervical Cancer Screening (HPV/Pap Cotest)  05/09/2023   COLON CANCER SCREENING ANNUAL FOBT  07/15/2023   Influenza Vaccine  10/27/2023   COVID-19 Vaccine (6 - 2025-26 season) 11/27/2023   HEMOGLOBIN A1C  05/25/2024   Mammogram  08/27/2024   OPHTHALMOLOGY EXAM  11/12/2024   FOOT EXAM  11/22/2024   Medicare Annual Wellness (AWV)  12/24/2024   Colonoscopy  10/23/2028   DTaP/Tdap/Td (2 - Td or Tdap) 04/07/2029   Pneumococcal Vaccine: 50+ Years   Completed   Hepatitis C Screening  Completed   HIV Screening  Completed   HPV VACCINES  Aged Out   Meningococcal B Vaccine  Aged Out    Health Maintenance Health Maintenance Due  Topic Date Due   Hepatitis B Vaccines 19-59 Average Risk (3 of 3 - 19+ 3-dose series) 05/18/2016   Zoster Vaccines- Shingrix (1 of 2) Never done   Cervical Cancer Screening (HPV/Pap Cotest)  05/09/2023   COLON CANCER SCREENING ANNUAL FOBT  07/15/2023   Influenza Vaccine  10/27/2023   COVID-19 Vaccine (6 - 2025-26 season) 11/27/2023   Health Maintenance Items Addressed: Provider made aware of referrals needed. Patient and spouse aware of HM items due and where to have those done at.   Additional Screening:  Vision Screening: Recommended annual ophthalmology exams for early detection of glaucoma and other disorders of the eye. Would you like a referral to an  eye doctor? No    Dental Screening: Recommended annual dental exams for proper oral hygiene  Community Resource Referral / Chronic Care Management: CRR required this visit?  No   CCM required this visit?  No   Plan:    I have personally reviewed and noted the following in the patient's chart:   Medical and social history Use of alcohol , tobacco or illicit drugs  Current medications and supplements including opioid prescriptions. Patient is not currently taking opioid prescriptions. Functional ability and status Nutritional status Physical activity Advanced directives List of other physicians Hospitalizations, surgeries, and ER visits in previous 12 months Vitals Screenings to include cognitive, depression, and falls Referrals and appointments  In addition, I have reviewed and discussed with patient certain preventive protocols, quality metrics, and best practice recommendations. A written personalized care plan for preventive services as well as general preventive health recommendations were provided to patient.   Kurtiss Wence,  CMA   12/25/2023   After Visit Summary: (MyChart) Due to this being a telephonic visit, the after visit summary with patients personalized plan was offered to patient via MyChart   Notes: Nothing significant to report at this time.

## 2023-12-26 DIAGNOSIS — N186 End stage renal disease: Secondary | ICD-10-CM | POA: Diagnosis not present

## 2023-12-26 DIAGNOSIS — Z992 Dependence on renal dialysis: Secondary | ICD-10-CM | POA: Diagnosis not present

## 2023-12-27 DIAGNOSIS — Z992 Dependence on renal dialysis: Secondary | ICD-10-CM | POA: Diagnosis not present

## 2023-12-27 DIAGNOSIS — Z23 Encounter for immunization: Secondary | ICD-10-CM | POA: Diagnosis not present

## 2023-12-27 DIAGNOSIS — N186 End stage renal disease: Secondary | ICD-10-CM | POA: Diagnosis not present

## 2023-12-29 DIAGNOSIS — Z992 Dependence on renal dialysis: Secondary | ICD-10-CM | POA: Diagnosis not present

## 2023-12-29 DIAGNOSIS — Z23 Encounter for immunization: Secondary | ICD-10-CM | POA: Diagnosis not present

## 2023-12-29 DIAGNOSIS — N186 End stage renal disease: Secondary | ICD-10-CM | POA: Diagnosis not present

## 2024-01-03 DIAGNOSIS — Z992 Dependence on renal dialysis: Secondary | ICD-10-CM | POA: Diagnosis not present

## 2024-01-03 DIAGNOSIS — Z23 Encounter for immunization: Secondary | ICD-10-CM | POA: Diagnosis not present

## 2024-01-03 DIAGNOSIS — N186 End stage renal disease: Secondary | ICD-10-CM | POA: Diagnosis not present

## 2024-01-04 ENCOUNTER — Other Ambulatory Visit: Payer: Self-pay | Admitting: Internal Medicine

## 2024-01-04 DIAGNOSIS — J209 Acute bronchitis, unspecified: Secondary | ICD-10-CM

## 2024-01-04 NOTE — Telephone Encounter (Unsigned)
 Copied from CRM [id number]. Topic: Clinical - Medication Refill >> Jan 04, 2024  3:59 PM Shanda MATSU wrote: Medication: albuterol  (VENTOLIN  HFA) 108 (90 Base) MCG/ACT inhaler  Has the patient contacted their pharmacy? Yes (Agent: If no, request that the patient contact the pharmacy for the refill. If patient does not wish to contact the pharmacy document the reason why and proceed with request.) (Agent: If yes, when and what did the pharmacy advise?)  This is the patient's preferred pharmacy:  [facility] - [place] [place] - [address] [place] [address] Phone: [phone] Fax: [phone]  Is this the correct pharmacy for this prescription? Yes If no, delete pharmacy and type the correct one.   Has the prescription been filled recently? No  Is the patient out of the medication? No  Has the patient been seen for an appointment in the last year OR does the patient have an upcoming appointment? Yes  Can we respond through MyChart? Yes  Agent: Please be advised that Rx refills may take up to 3 business days. We ask that you follow-up with your pharmacy.

## 2024-01-05 DIAGNOSIS — N186 End stage renal disease: Secondary | ICD-10-CM | POA: Diagnosis not present

## 2024-01-05 DIAGNOSIS — Z992 Dependence on renal dialysis: Secondary | ICD-10-CM | POA: Diagnosis not present

## 2024-01-05 DIAGNOSIS — Z23 Encounter for immunization: Secondary | ICD-10-CM | POA: Diagnosis not present

## 2024-01-05 MED ORDER — ALBUTEROL SULFATE HFA 108 (90 BASE) MCG/ACT IN AERS: 2.0000 | INHALATION_SPRAY | Freq: Four times a day (QID) | RESPIRATORY_TRACT | 2 refills | Status: DC | PRN

## 2024-01-08 DIAGNOSIS — Z23 Encounter for immunization: Secondary | ICD-10-CM | POA: Diagnosis not present

## 2024-01-08 DIAGNOSIS — N186 End stage renal disease: Secondary | ICD-10-CM | POA: Diagnosis not present

## 2024-01-08 DIAGNOSIS — Z992 Dependence on renal dialysis: Secondary | ICD-10-CM | POA: Diagnosis not present

## 2024-01-09 ENCOUNTER — Encounter: Payer: Self-pay | Admitting: Internal Medicine

## 2024-01-09 ENCOUNTER — Ambulatory Visit (INDEPENDENT_AMBULATORY_CARE_PROVIDER_SITE_OTHER): Admitting: Internal Medicine

## 2024-01-09 VITALS — BP 149/81 | HR 56

## 2024-01-09 DIAGNOSIS — R918 Other nonspecific abnormal finding of lung field: Secondary | ICD-10-CM

## 2024-01-09 NOTE — Patient Instructions (Signed)
 Please remember to go to the  x-ray department  @  [facility] for your tests - we will call you with the results when they are available      Follow up is as needed please  but bring your inhaler with you for training

## 2024-01-09 NOTE — Progress Notes (Unsigned)
 Janet Mitchell, female    DOB: 01/14/1966    MRN: [id number]   Brief patient profile:  21  yobf  never smoker  referred to pulmonary clinic in [place]  08/29/2023 by Dr Tobie  for abnormal CT       Intracranial hemorrhage with dense left hemiparesis Large right thalamic bleed Obstructive hydrocephalus S/p shunt placement -Appreciate neurosurgery follow-up -On [place] need for LTAC  Hypertensive emergency -Labetalol  as needed -Amlodipine , losartan   End-stage renal disease on hemodialysis -Renal service continues to follow -Avoid nephrotoxic's, renally dose medications -For dialysis 619  Chronic anemia due to end-stage renal disease -On iron minutes -Has remained stable  Hyperglycemia -Continue SSI  Respiratory failure -Off ventilator -Trach was changed 09/10/2021    History of Present Illness  08/29/2023  Pulmonary/ 1st office eval/ Janet Mitchell / [facility] Mitchell [facility] on MWF HD  Chief Complaint  Patient presents with   Shortness of Breath   Establish Care  Dyspnea:  howyer into chair / still able to go out church/ HD / minimal self propelling  Cough: p covid 2021 / worse when head hits pillow > clear mucus  Sleep: bed is electri x 45  pillows under head  SABA use: first things  02: none  Rec Prednisone  10 mg take  4 each am x 2 days,   2 each am x 2 days,  1 each am x 2 days and stop  Change protonix  to 40 mg  before first meal and add pepcid  20 mg an hour before bed.  Please schedule a follow up visit in 3 months but call sooner if needed  If above not successful for the night time cough,   For drainage / throat tickle try take CHLORPHENIRAMINE  4 mg     08/29/2023 ACE level 79  - Blasto 08/29/2023    - Cyrpo 08/29/2023 neg  - Quant Gold TB 08/29/2023 neg - ACE  08/29/23  - ESR 08/29/23  49  - Allergy screen 08/29/23 >  Eos 0.2 /  IgE  82   01/09/2024  f/u ov/Janet Mitchell office/Janet Mitchell re: cough  maint on albterol prn  MWF HD - did not bring inhalers Chief Complaint  Patient  presents with   Shortness of Breath    F/u   Dyspnea:  hoyer bed to w/c and back again  Cough: dry sporadic Sleeping: 45 degrees s resp cc SABA use: rarely but does seem to help 02: none  No obvious day to day or daytime variability or assoc excess/ purulent sputum or mucus plugs or hemoptysis or cp or chest tightness, subjective wheeze or overt sinus or hb symptoms.    Also denies any obvious fluctuation of symptoms with weather or environmental changes or other aggravating or alleviating factors except as outlined above   No unusual exposure hx or h/o childhood pna/ asthma or knowledge of premature birth.  Current Allergies, Complete Past Medical History, Past Surgical History, Family History, and Social History were reviewed in [employer] record.  ROS  The following are not active complaints unless bolded Hoarseness, sore throat, dysphagia, dental problems, itching, sneezing,  nasal congestion or discharge of excess mucus or purulent secretions, ear ache,   fever, chills, sweats, unintended wt loss or wt gain, classically pleuritic or exertional cp,  orthopnea pnd or arm/hand swelling  or leg swelling, presyncope, palpitations, abdominal pain, anorexia, nausea, vomiting, diarrhea  or change in bowel habits or change in bladder habits, change in stools or change in urine,  dysuria, hematuria,  rash, arthralgias, visual complaints, headache, numbness, weakness or ataxia or problems with walking or coordination,  change in mood or  memory.        Current Meds  Medication Sig   albuterol  (VENTOLIN  HFA) 108 (90 Base) MCG/ACT inhaler Inhale 2 puffs into the lungs every 6 (six) hours as needed for wheezing or shortness of breath.   amLODipine  (NORVASC ) 5 MG tablet Take 1 tablet (5 mg total) by mouth every evening.   calcitRIOL  (ROCALTROL ) 0.5 MCG capsule Take 1 capsule (0.5 mcg total) by mouth every other day.   carvedilol  (COREG ) 6.25 MG tablet Take 1 tablet (6.25 mg  total) by mouth 2 (two) times daily with a meal.   cetirizine  (ZYRTEC ) 10 MG tablet Take 1 tablet (10 mg total) by mouth daily.   famotidine  (PEPCID ) 20 MG tablet One after supper   glipiZIDE  (GLUCOTROL  XL) 5 MG 24 hr tablet Take 1 tablet (5 mg total) by mouth daily with breakfast.   multivitamin (RENA-VIT) TABS tablet Take 1 tablet by mouth daily.   pantoprazole  (PROTONIX ) 40 MG tablet Take 1 tablet (40 mg total) by mouth daily. (Patient taking differently: Take 40 mg by mouth daily as needed (for acid reflux).)   sevelamer  carbonate (RENVELA ) 800 MG tablet 1,600 mg 3 (three) times daily with meals.   traMADol (ULTRAM) 50 MG tablet Take 50 mg by mouth daily as needed for moderate pain (pain score 4-6) or severe pain (pain score 7-10).   traZODone  (DESYREL ) 50 MG tablet Take 0.5-1 tablets (25-50 mg total) by mouth at bedtime as needed. for sleep   [DISCONTINUED] predniSONE  (DELTASONE ) 10 MG tablet Take 1 tablet (10 mg total) by mouth daily with breakfast.             Past Medical History:  Diagnosis Date   Abdominal wall hematoma 12/26/2019   Anemia    Arthritis    CHF (congestive heart failure) (HCC)    pt states she has been cleared of heart failure/disease   Chronic cholecystitis with calculus    COVID-19 03/26/2019   COVID-19 virus infection 03/2019   ESRD (end stage renal disease) on dialysis [facility])    Dialysis M-W-F   Headache    a few/wk (03/09/2018) - no longer having these   History of blood transfusion 10/2017   low blood count (03/09/2018)   Hypertension    OSA (obstructive sleep apnea) 10/04/2022   Respiratory failure requiring intubation (HCC)    Seasonal allergies    Spinal headache    Status post insertion of percutaneous endoscopic gastrostomy (PEG) tube (HCC) 01/26/2022   Type II diabetes mellitus (HCC)       Objective:     Wt Readings from Last 3 Encounters:  12/25/23 271 lb (122.9 kg)  10/12/23 270 lb (122.5 kg)  10/05/23 280 lb 3.2 oz (127.1 kg)     Vital signs reviewed  01/09/2024  - Note at rest 02 sats  100% on RA   General appearance:    w/c bound obese w/c bound bf nad   HEENT : Oropharynx  clear      Nasal turbinates nl    NECK :  without  apparent JVD/ palpable Nodes/TM    LUNGS: no acc muscle use,  Nl contour chest which is clear to A and P bilaterally without cough on insp or exp maneuvers   CV:  RRR  no s3 or murmur or increase in P2, and no edema   ABD:  soft and nontender   MS:      ext warm without deformities Or obvious joint restrictions  calf tenderness, cyanosis or clubbing    SKIN: warm and dry without lesions    NEURO:  alert, approp, nl sensorium       I personally reviewed images and agree with radiology impression as follows:   Chest HRCT    08/19/23 1. Image quality is degraded by respiratory motion and expiratory phase imaging. 2. Coarsened peribronchovascular nodularity throughout the lungs may be due to sarcoid. 3. Age advanced three-vessel coronary artery calcification. 4. Air trapping is indicative of small airways disease. 5.  Aortic atherosclerosis (ICD10-I70.0). 6. Enlarged pulmonic trunk, indicative of pulmonary arterial hypertension.  CXR PA and Lateral:   01/09/2024 :    I personally reviewed images and impression is as follows:     Did not go for cxr as rec       Sinus MRI   10/03/23 nl      Assessment   Assessment & Plan Multiple pulmonary nodules determined by computed tomography of lung Onset ? 2023  - 08/29/2023 ACE level 79  - Blasto 08/29/2023    - Cypto 08/29/2023 neg  - Quant Gold TB 08/29/2023 neg - ACE  08/29/23  - ESR 08/29/23  49  - Allergy screen 08/29/23 >  Eos 0.2 /  IgE  82  W/u is unremarkable and she is basically asymptomatic so f/u can be prn increase in cough and doe but must return with all meds in hand using a trust but verify approach to confirm accurate Medication  Reconciliation The principal here is that until we are certain that the  patients are doing what  we've asked, it makes no sense to ask them to do more.      Each maintenance medication was reviewed in detail including emphasizing most importantly the difference between maintenance and prns and under what circumstances the prns are to be triggered using an action plan format where appropriate.  Total time for H and P, chart review, counseling, reviewing hfa device(s) and generating customized AVS unique to this office visit / same day charting = 25 min summary f/u ov          AVS  Patient Instructions  Please remember to go to the  x-ray department  @  [facility] for your tests - we will call you with the results when they are available      Follow up is as needed please  but bring your inhaler with you for training     Ozell America, MD 01/10/2024

## 2024-01-10 DIAGNOSIS — Z992 Dependence on renal dialysis: Secondary | ICD-10-CM | POA: Diagnosis not present

## 2024-01-10 DIAGNOSIS — Z23 Encounter for immunization: Secondary | ICD-10-CM | POA: Diagnosis not present

## 2024-01-10 DIAGNOSIS — R531 Weakness: Secondary | ICD-10-CM | POA: Diagnosis not present

## 2024-01-10 DIAGNOSIS — N186 End stage renal disease: Secondary | ICD-10-CM | POA: Diagnosis not present

## 2024-01-10 NOTE — Assessment & Plan Note (Addendum)
 Onset ? 2023  - 08/29/2023 ACE level 79  - Blasto 08/29/2023    - Cypto 08/29/2023 neg  - Quant Gold TB 08/29/2023 neg - ACE  08/29/23  - ESR 08/29/23  49  - Allergy screen 08/29/23 >  Eos 0.2 /  IgE  82  W/u is unremarkable and she is basically asymptomatic so f/u can be prn increase in cough and doe but must return with all meds in hand using a trust but verify approach to confirm accurate Medication  Reconciliation The principal here is that until we are certain that the  patients are doing what we've asked, it makes no sense to ask them to do more.      Each maintenance medication was reviewed in detail including emphasizing most importantly the difference between maintenance and prns and under what circumstances the prns are to be triggered using an action plan format where appropriate.  Total time for H and P, chart review, counseling, reviewing hfa device(s) and generating customized AVS unique to this office visit / same day charting = 25 min summary f/u ov

## 2024-01-11 ENCOUNTER — Other Ambulatory Visit: Payer: Self-pay | Admitting: Internal Medicine

## 2024-01-11 DIAGNOSIS — Z23 Encounter for immunization: Secondary | ICD-10-CM | POA: Diagnosis not present

## 2024-01-11 DIAGNOSIS — N186 End stage renal disease: Secondary | ICD-10-CM | POA: Diagnosis not present

## 2024-01-11 DIAGNOSIS — J209 Acute bronchitis, unspecified: Secondary | ICD-10-CM

## 2024-01-11 DIAGNOSIS — Z992 Dependence on renal dialysis: Secondary | ICD-10-CM | POA: Diagnosis not present

## 2024-01-11 MED ORDER — ALBUTEROL SULFATE HFA 108 (90 BASE) MCG/ACT IN AERS: 2.0000 | INHALATION_SPRAY | Freq: Four times a day (QID) | RESPIRATORY_TRACT | 3 refills | Status: DC | PRN

## 2024-01-15 ENCOUNTER — Other Ambulatory Visit: Payer: Self-pay | Admitting: Internal Medicine

## 2024-01-15 DIAGNOSIS — Z23 Encounter for immunization: Secondary | ICD-10-CM | POA: Diagnosis not present

## 2024-01-15 DIAGNOSIS — J209 Acute bronchitis, unspecified: Secondary | ICD-10-CM

## 2024-01-15 DIAGNOSIS — N186 End stage renal disease: Secondary | ICD-10-CM | POA: Diagnosis not present

## 2024-01-15 DIAGNOSIS — Z992 Dependence on renal dialysis: Secondary | ICD-10-CM | POA: Diagnosis not present

## 2024-01-16 ENCOUNTER — Other Ambulatory Visit: Payer: Self-pay | Admitting: Internal Medicine

## 2024-01-16 NOTE — Telephone Encounter (Signed)
 Copied from CRM #[id number]. Topic: Clinical - Medication Refill >> Jan 16, 2024 10:27 AM Mia F wrote: Medication: cetirizine  (ZYRTEC ) 10 MG tablet   Has the patient contacted their pharmacy? Yes (Agent: If no, request that the patient contact the pharmacy for the refill. If patient does not wish to contact the pharmacy document the reason why and proceed with request.) (Agent: If yes, when and what did the pharmacy advise?)  This is the patient's preferred pharmacy:   [facility] - [place], [place] - [address] [place] [address] Phone: [phone] Fax: [phone]  Is this the correct pharmacy for this prescription? Yes If no, delete pharmacy and type the correct one.   Has the prescription been filled recently? Not Sure (Pharmacy Called)   Is the patient out of the medication? No  Has the patient been seen for an appointment in the last year OR does the patient have an upcoming appointment? Yes  Can we respond through MyChart? No  Agent: Please be advised that Rx refills may take up to 3 business days. We ask that you follow-up with your pharmacy.

## 2024-01-17 DIAGNOSIS — Z992 Dependence on renal dialysis: Secondary | ICD-10-CM | POA: Diagnosis not present

## 2024-01-17 DIAGNOSIS — Z23 Encounter for immunization: Secondary | ICD-10-CM | POA: Diagnosis not present

## 2024-01-17 DIAGNOSIS — N186 End stage renal disease: Secondary | ICD-10-CM | POA: Diagnosis not present

## 2024-01-19 DIAGNOSIS — Z992 Dependence on renal dialysis: Secondary | ICD-10-CM | POA: Diagnosis not present

## 2024-01-19 DIAGNOSIS — N186 End stage renal disease: Secondary | ICD-10-CM | POA: Diagnosis not present

## 2024-01-19 DIAGNOSIS — Z23 Encounter for immunization: Secondary | ICD-10-CM | POA: Diagnosis not present

## 2024-01-22 DIAGNOSIS — Z992 Dependence on renal dialysis: Secondary | ICD-10-CM | POA: Diagnosis not present

## 2024-01-22 DIAGNOSIS — Z23 Encounter for immunization: Secondary | ICD-10-CM | POA: Diagnosis not present

## 2024-01-22 DIAGNOSIS — N186 End stage renal disease: Secondary | ICD-10-CM | POA: Diagnosis not present

## 2024-01-24 DIAGNOSIS — N186 End stage renal disease: Secondary | ICD-10-CM | POA: Diagnosis not present

## 2024-01-24 DIAGNOSIS — Z992 Dependence on renal dialysis: Secondary | ICD-10-CM | POA: Diagnosis not present

## 2024-01-24 DIAGNOSIS — Z23 Encounter for immunization: Secondary | ICD-10-CM | POA: Diagnosis not present

## 2024-01-26 DIAGNOSIS — Z992 Dependence on renal dialysis: Secondary | ICD-10-CM | POA: Diagnosis not present

## 2024-01-26 DIAGNOSIS — Z23 Encounter for immunization: Secondary | ICD-10-CM | POA: Diagnosis not present

## 2024-01-26 DIAGNOSIS — N186 End stage renal disease: Secondary | ICD-10-CM | POA: Diagnosis not present

## 2024-01-29 ENCOUNTER — Telehealth: Payer: Self-pay

## 2024-01-29 ENCOUNTER — Other Ambulatory Visit: Payer: Self-pay | Admitting: Internal Medicine

## 2024-01-29 DIAGNOSIS — G8194 Hemiplegia, unspecified affecting left nondominant side: Secondary | ICD-10-CM

## 2024-01-29 NOTE — Telephone Encounter (Signed)
 Copied from CRM [id number]. Topic: Clinical - Order For Equipment >> Jan 29, 2024 12:22 PM Delon DASEN wrote: Reason for CRM: Patient needs a new prescription to replace broken wheelchair, [employer] fax [phone]

## 2024-01-30 NOTE — Telephone Encounter (Signed)
 Order faxed.

## 2024-02-05 ENCOUNTER — Telehealth

## 2024-02-05 NOTE — Telephone Encounter (Signed)
 Copied from CRM [id number]. Topic: Clinical - Order For Equipment >> Feb 02, 2024  4:52 PM Hadassah PARAS wrote: Reason for CRM: Issue w [employer] regarding order replacement for manual wheelchair. Pt spoke with [employer] and they are stating they did not receive it. Please resend  Fax#[phone],can also be sent through [employer]. Phone #[phone]  Please advise pt when done [phone]

## 2024-02-05 NOTE — Telephone Encounter (Signed)
 Submitted order an parachute.

## 2024-03-08 ENCOUNTER — Other Ambulatory Visit: Payer: Self-pay | Admitting: Internal Medicine

## 2024-03-08 DIAGNOSIS — F5101 Primary insomnia: Secondary | ICD-10-CM

## 2024-04-04 ENCOUNTER — Telehealth: Payer: Self-pay

## 2024-04-04 NOTE — Telephone Encounter (Signed)
 Copied from CRM #[id number]. Topic: General - Other >> Apr 04, 2024 12:27 PM Zebedee SAUNDERS wrote: Reason for CRM: Pt's husband Debrah SLUDER [employer] ,[id number], [phone] had a stroke and is unable to care for pt. Pt needs a care facility for pt. Please call pt [phone] or Alphony [name] [employer] ,[phone], [phone].

## 2024-04-10 ENCOUNTER — Other Ambulatory Visit: Payer: Self-pay | Admitting: Internal Medicine

## 2024-04-10 DIAGNOSIS — J209 Acute bronchitis, unspecified: Secondary | ICD-10-CM

## 2024-04-10 MED ORDER — ALBUTEROL SULFATE HFA 108 (90 BASE) MCG/ACT IN AERS: 2.0000 | INHALATION_SPRAY | Freq: Four times a day (QID) | RESPIRATORY_TRACT | 3 refills | Status: DC | PRN

## 2024-04-10 NOTE — Telephone Encounter (Signed)
 Copied from CRM [id number]. Topic: Clinical - Medication Refill >> Apr 10, 2024  2:40 PM Avram MATSU wrote: Medication: albuterol  (VENTOLIN  HFA) 108 (90 Base) MCG/ACT inhaler [[id number]]  Has the patient contacted their pharmacy? Yes (Agent: If no, request that the patient contact the pharmacy for the refill. If patient does not wish to contact the pharmacy document the reason why and proceed with request.) (Agent: If yes, when and what did the pharmacy advise?)  This is the patient's preferred pharmacy:   [facility] - [place], [place] - [address] [place] [address] Phone: [phone] Fax: [phone]  Is this the correct pharmacy for this prescription? Yes If no, delete pharmacy and type the correct one.   Has the prescription been filled recently? No  Is the patient out of the medication? No  Has the patient been seen for an appointment in the last year OR does the patient have an upcoming appointment? Yes  Can we respond through MyChart? Yes  Agent: Please be advised that Rx refills may take up to 3 business days. We ask that you follow-up with your pharmacy.

## 2024-04-11 ENCOUNTER — Telehealth: Payer: Self-pay | Admitting: Internal Medicine

## 2024-04-11 NOTE — Telephone Encounter (Signed)
 Copied from CRM #[id number]. Topic: General - Other >> Apr 11, 2024 12:37 PM Emylou G wrote: Reason for CRM: Daughter called.. wants to know if the form FS2 or the paperwork can be picked up today ( she isn't 100% what was dropped off.. her dad asked her to come by today for it ).. Pls cal her back at number attached.. Janet Mitchell [phone]

## 2024-04-16 ENCOUNTER — Other Ambulatory Visit: Payer: Self-pay | Admitting: Internal Medicine

## 2024-04-16 DIAGNOSIS — J209 Acute bronchitis, unspecified: Secondary | ICD-10-CM

## 2024-04-23 ENCOUNTER — Ambulatory Visit: Admitting: Podiatry

## 2024-04-23 ENCOUNTER — Ambulatory Visit (INDEPENDENT_AMBULATORY_CARE_PROVIDER_SITE_OTHER)

## 2024-04-23 DIAGNOSIS — L89610 Pressure ulcer of right heel, unstageable: Secondary | ICD-10-CM | POA: Diagnosis not present

## 2024-04-23 DIAGNOSIS — M7751 Other enthesopathy of right foot: Secondary | ICD-10-CM | POA: Diagnosis not present

## 2024-04-23 DIAGNOSIS — I739 Peripheral vascular disease, unspecified: Secondary | ICD-10-CM

## 2024-04-23 NOTE — Progress Notes (Unsigned)
"  °  Subjective:  Patient ID: Janet Mitchell, female    DOB: [birth year],  MRN: [id number]  Chief Complaint  Patient presents with   Foot Pain    Right foot swelling and busing on heel. Pt also complaining of toe pain in right foot. Diabetic A1c    59 y.o. female presents with the above complaint. History confirmed with patient.  She returns today with a follow-up for a new issue on the right foot previously had a chronic wound of the left foot was scheduled for surgery with me, she had a stroke and ended up having to cancel the surgery, the wound is healed since her stroke.  The pressure injury on the right foot started last week her husband noted it on Saturday and presents today for evaluation.  Objective:  Physical Exam: +1 edema right lower extremity, she has a strong palpable DP pulse, weak PT pulse, unstageable heel ulcer posterior and plantar lateral heel    Radiographs: Multiple views x-ray of the right foot: no soft tissue emphysema, no signs of osteomyelitis and vascular calcifications noted Assessment:     ICD-10-CM   1. Pressure ulcer of right heel, unstageable (HCC)  L89.610 DG Foot Complete Right    VAS US  ABI WITH/WO TBI    VAS US  LOWER EXTREMITY ARTERIAL DUPLEX    2. PAD (peripheral artery disease)  I73.9 VAS US  ABI WITH/WO TBI    VAS US  LOWER EXTREMITY ARTERIAL DUPLEX        Plan:  Patient was evaluated and treated and all questions answered.  Unstageable heel ulcer right lower extremity, recommended offloading they have an offloading boot already and I recommend also placing a pillow behind the calf.  Has a strong palpable DP pulse but PT pulse is weak, recommended noninvasive vascular testing and new referral for this has been placed.  Follow-up with me in 3 weeks to reevaluate photographs taken.  Return in about 3 weeks (around 05/14/2024) for wound care / check R heel ulcer.   "

## 2024-04-23 NOTE — Patient Instructions (Signed)
 Call to schedule your vascular testing:   [facility] Jeralene Mom. [facility] [address] [place],  [place]  [address]   Main: [phone]

## 2024-04-25 ENCOUNTER — Telehealth: Payer: Self-pay

## 2024-04-25 NOTE — Telephone Encounter (Signed)
 Sent 04-10-2024

## 2024-04-25 NOTE — Telephone Encounter (Signed)
 Copied from CRM #[id number]. Topic: Clinical - Medication Refill >> Apr 25, 2024  9:43 AM Harlene ORN wrote: Medication: albuterol  (VENTOLIN  HFA) 108 (90 Base) MCG/ACT inhaler  Has the patient contacted their pharmacy? Yes (Agent: If no, request that the patient contact the pharmacy for the refill. If patient does not wish to contact the pharmacy document the reason why and proceed with request.) (Agent: If yes, when and what did the pharmacy advise?)  This is the patient's preferred pharmacy:   [facility] - [place], [place] - [address] [place] [address] Phone: [phone] Fax: [phone]  Is this the correct pharmacy for this prescription? Yes If no, delete pharmacy and type the correct one.   Has the prescription been filled recently? No  Is the patient out of the medication? Yes  Has the patient been seen for an appointment in the last year OR does the patient have an upcoming appointment? Yes  Can we respond through MyChart? Yes  Agent: Please be advised that Rx refills may take up to 3 business days. We ask that you follow-up with your pharmacy.

## 2024-05-02 ENCOUNTER — Ambulatory Visit (HOSPITAL_COMMUNITY)
Admission: RE | Admit: 2024-05-02 | Discharge: 2024-05-02 | Disposition: A | Source: Ambulatory Visit | Attending: Podiatry

## 2024-05-02 ENCOUNTER — Other Ambulatory Visit: Payer: Self-pay | Admitting: Internal Medicine

## 2024-05-02 DIAGNOSIS — I739 Peripheral vascular disease, unspecified: Secondary | ICD-10-CM

## 2024-05-02 DIAGNOSIS — L89610 Pressure ulcer of right heel, unstageable: Secondary | ICD-10-CM

## 2024-05-02 DIAGNOSIS — J209 Acute bronchitis, unspecified: Secondary | ICD-10-CM

## 2024-05-02 LAB — VAS US ABI WITH/WO TBI

## 2024-05-02 NOTE — Telephone Encounter (Unsigned)
 Copied from CRM #[id number]. Topic: Clinical - Medication Refill >> May 02, 2024  5:09 PM Shereese L wrote: Medication: albuterol  (VENTOLIN  HFA) 108 (90 Base) MCG/ACT inhaler    Has the patient contacted their pharmacy? Yes (Agent: If no, request that the patient contact the pharmacy for the refill. If patient does not wish to contact the pharmacy document the reason why and proceed with request.) (Agent: If yes, when and what did the pharmacy advise?)  This is the patient's preferred pharmacy:   [facility] - [place], [place] - [address] [place] [address] Phone: [phone] Fax: [phone]  Is this the correct pharmacy for this prescription? Yes If no, delete pharmacy and type the correct one.   Has the prescription been filled recently? Yes  Is the patient out of the medication? Yes  Has the patient been seen for an appointment in the last year OR does the patient have an upcoming appointment? Yes  Can we respond through MyChart? Yes  Agent: Please be advised that Rx refills may take up to 3 business days. We ask that you follow-up with your pharmacy.

## 2024-05-03 ENCOUNTER — Ambulatory Visit: Payer: Self-pay | Admitting: Podiatry

## 2024-05-03 MED ORDER — ALBUTEROL SULFATE HFA 108 (90 BASE) MCG/ACT IN AERS: 2.0000 | INHALATION_SPRAY | Freq: Four times a day (QID) | RESPIRATORY_TRACT | 3 refills | Status: AC | PRN

## 2024-05-14 ENCOUNTER — Ambulatory Visit: Admitting: Podiatry

## 2024-05-21 ENCOUNTER — Ambulatory Visit: Admitting: Internal Medicine

## 2024-12-25 ENCOUNTER — Ambulatory Visit

## 2024-12-31 ENCOUNTER — Ambulatory Visit
